# Patient Record
Sex: Female | Born: 1935 | Race: White | Hispanic: No | Marital: Married | Smoking: Former smoker
Health system: Southern US, Community
[De-identification: ages and names within clinical notes are randomized; demographics above are authoritative.]

## PROBLEM LIST (undated history)

## (undated) DIAGNOSIS — I5032 Chronic diastolic (congestive) heart failure: Secondary | ICD-10-CM

## (undated) DIAGNOSIS — Z9889 Other specified postprocedural states: Secondary | ICD-10-CM

## (undated) DIAGNOSIS — Z8679 Personal history of other diseases of the circulatory system: Secondary | ICD-10-CM

## (undated) DIAGNOSIS — M1990 Unspecified osteoarthritis, unspecified site: Secondary | ICD-10-CM

## (undated) DIAGNOSIS — I10 Essential (primary) hypertension: Secondary | ICD-10-CM

## (undated) DIAGNOSIS — R112 Nausea with vomiting, unspecified: Secondary | ICD-10-CM

## (undated) DIAGNOSIS — I350 Nonrheumatic aortic (valve) stenosis: Secondary | ICD-10-CM

## (undated) DIAGNOSIS — I2510 Atherosclerotic heart disease of native coronary artery without angina pectoris: Secondary | ICD-10-CM

## (undated) DIAGNOSIS — I5033 Acute on chronic diastolic (congestive) heart failure: Secondary | ICD-10-CM

## (undated) DIAGNOSIS — R011 Cardiac murmur, unspecified: Secondary | ICD-10-CM

## (undated) DIAGNOSIS — L03116 Cellulitis of left lower limb: Secondary | ICD-10-CM

## (undated) DIAGNOSIS — R42 Dizziness and giddiness: Secondary | ICD-10-CM

## (undated) DIAGNOSIS — N189 Chronic kidney disease, unspecified: Secondary | ICD-10-CM

## (undated) DIAGNOSIS — I480 Paroxysmal atrial fibrillation: Secondary | ICD-10-CM

## (undated) DIAGNOSIS — I482 Chronic atrial fibrillation, unspecified: Secondary | ICD-10-CM

## (undated) DIAGNOSIS — N390 Urinary tract infection, site not specified: Secondary | ICD-10-CM

## (undated) DIAGNOSIS — Z7901 Long term (current) use of anticoagulants: Secondary | ICD-10-CM

## (undated) DIAGNOSIS — J449 Chronic obstructive pulmonary disease, unspecified: Secondary | ICD-10-CM

## (undated) DIAGNOSIS — J45909 Unspecified asthma, uncomplicated: Secondary | ICD-10-CM

## (undated) DIAGNOSIS — Z953 Presence of xenogenic heart valve: Secondary | ICD-10-CM

## (undated) DIAGNOSIS — I38 Endocarditis, valve unspecified: Secondary | ICD-10-CM

## (undated) HISTORY — DX: Chronic atrial fibrillation, unspecified: I48.20

## (undated) HISTORY — DX: Acute on chronic diastolic (congestive) heart failure: I50.33

## (undated) HISTORY — PX: OTHER SURGICAL HISTORY: SHX169

## (undated) HISTORY — DX: Essential (primary) hypertension: I10

## (undated) HISTORY — DX: Personal history of other diseases of the circulatory system: Z86.79

## (undated) HISTORY — DX: Unspecified osteoarthritis, unspecified site: M19.90

## (undated) HISTORY — DX: Endocarditis, valve unspecified: I38

## (undated) HISTORY — DX: Cardiac murmur, unspecified: R01.1

## (undated) HISTORY — DX: Chronic diastolic (congestive) heart failure: I50.32

## (undated) HISTORY — PX: APPENDECTOMY: SHX54

## (undated) HISTORY — PX: CARDIAC CATHETERIZATION: SHX172

## (undated) HISTORY — DX: Chronic kidney disease, unspecified: N18.9

## (undated) HISTORY — DX: Long term (current) use of anticoagulants: Z79.01

## (undated) HISTORY — DX: Atherosclerotic heart disease of native coronary artery without angina pectoris: I25.10

## (undated) HISTORY — PX: BACK SURGERY: SHX140

## (undated) HISTORY — DX: Nonrheumatic aortic (valve) stenosis: I35.0

## (undated) HISTORY — DX: Urinary tract infection, site not specified: N39.0

## (undated) HISTORY — PX: EYE SURGERY: SHX253

## (undated) HISTORY — DX: Cellulitis of left lower limb: L03.116

## (undated) HISTORY — PX: COLONOSCOPY: SHX174

## (undated) SURGERY — ECHOCARDIOGRAM, TRANSESOPHAGEAL
Anesthesia: Moderate Sedation

---

## 1968-09-27 HISTORY — PX: MITRAL VALVE SURGERY: SHX714

## 1981-09-27 HISTORY — PX: ABDOMINAL HYSTERECTOMY: SHX81

## 1998-06-09 ENCOUNTER — Other Ambulatory Visit: Admission: RE | Admit: 1998-06-09 | Discharge: 1998-06-09 | Payer: Self-pay | Admitting: Cardiology

## 1999-01-21 ENCOUNTER — Encounter: Payer: Self-pay | Admitting: Neurosurgery

## 1999-01-21 ENCOUNTER — Ambulatory Visit (HOSPITAL_COMMUNITY): Admission: RE | Admit: 1999-01-21 | Discharge: 1999-01-21 | Payer: Self-pay | Admitting: Neurosurgery

## 1999-01-26 ENCOUNTER — Encounter: Payer: Self-pay | Admitting: Neurosurgery

## 1999-01-27 ENCOUNTER — Inpatient Hospital Stay (HOSPITAL_COMMUNITY): Admission: RE | Admit: 1999-01-27 | Discharge: 1999-01-30 | Payer: Self-pay | Admitting: Neurosurgery

## 1999-01-27 ENCOUNTER — Encounter: Payer: Self-pay | Admitting: Neurosurgery

## 1999-08-14 ENCOUNTER — Encounter: Admission: RE | Admit: 1999-08-14 | Discharge: 1999-08-14 | Payer: Self-pay | Admitting: Cardiology

## 1999-08-14 ENCOUNTER — Encounter: Payer: Self-pay | Admitting: Cardiology

## 2000-08-02 ENCOUNTER — Encounter: Payer: Self-pay | Admitting: Emergency Medicine

## 2000-08-02 ENCOUNTER — Emergency Department (HOSPITAL_COMMUNITY): Admission: EM | Admit: 2000-08-02 | Discharge: 2000-08-02 | Payer: Self-pay | Admitting: Emergency Medicine

## 2000-08-08 ENCOUNTER — Emergency Department (HOSPITAL_COMMUNITY): Admission: EM | Admit: 2000-08-08 | Discharge: 2000-08-08 | Payer: Self-pay | Admitting: Emergency Medicine

## 2000-08-15 ENCOUNTER — Encounter: Payer: Self-pay | Admitting: Neurosurgery

## 2000-08-15 ENCOUNTER — Ambulatory Visit (HOSPITAL_COMMUNITY): Admission: RE | Admit: 2000-08-15 | Discharge: 2000-08-15 | Payer: Self-pay | Admitting: Neurosurgery

## 2000-08-23 ENCOUNTER — Encounter: Payer: Self-pay | Admitting: Cardiology

## 2000-08-23 ENCOUNTER — Encounter: Admission: RE | Admit: 2000-08-23 | Discharge: 2000-08-23 | Payer: Self-pay | Admitting: Cardiology

## 2000-09-07 ENCOUNTER — Other Ambulatory Visit: Admission: RE | Admit: 2000-09-07 | Discharge: 2000-09-07 | Payer: Self-pay | Admitting: Cardiology

## 2000-11-28 ENCOUNTER — Encounter: Payer: Self-pay | Admitting: Neurosurgery

## 2000-11-28 ENCOUNTER — Ambulatory Visit (HOSPITAL_COMMUNITY): Admission: RE | Admit: 2000-11-28 | Discharge: 2000-11-28 | Payer: Self-pay | Admitting: Neurosurgery

## 2001-09-11 ENCOUNTER — Encounter: Payer: Self-pay | Admitting: Cardiology

## 2001-09-11 ENCOUNTER — Encounter: Admission: RE | Admit: 2001-09-11 | Discharge: 2001-09-11 | Payer: Self-pay | Admitting: Cardiology

## 2002-10-08 ENCOUNTER — Encounter: Admission: RE | Admit: 2002-10-08 | Discharge: 2002-10-08 | Payer: Self-pay | Admitting: Cardiology

## 2002-10-08 ENCOUNTER — Encounter: Payer: Self-pay | Admitting: Cardiology

## 2003-02-12 ENCOUNTER — Other Ambulatory Visit: Admission: RE | Admit: 2003-02-12 | Discharge: 2003-02-12 | Payer: Self-pay | Admitting: Gynecology

## 2003-08-13 ENCOUNTER — Emergency Department (HOSPITAL_COMMUNITY): Admission: EM | Admit: 2003-08-13 | Discharge: 2003-08-13 | Payer: Self-pay | Admitting: Emergency Medicine

## 2003-08-16 ENCOUNTER — Encounter: Admission: RE | Admit: 2003-08-16 | Discharge: 2003-08-16 | Payer: Self-pay | Admitting: Cardiology

## 2003-09-03 ENCOUNTER — Other Ambulatory Visit: Admission: RE | Admit: 2003-09-03 | Discharge: 2003-09-03 | Payer: Self-pay | Admitting: Gynecology

## 2003-11-01 ENCOUNTER — Encounter: Admission: RE | Admit: 2003-11-01 | Discharge: 2003-11-01 | Payer: Self-pay | Admitting: Cardiology

## 2004-01-13 ENCOUNTER — Encounter: Admission: RE | Admit: 2004-01-13 | Discharge: 2004-01-13 | Payer: Self-pay | Admitting: Cardiology

## 2004-03-10 ENCOUNTER — Other Ambulatory Visit: Admission: RE | Admit: 2004-03-10 | Discharge: 2004-03-10 | Payer: Self-pay | Admitting: Gynecology

## 2004-06-03 ENCOUNTER — Emergency Department (HOSPITAL_COMMUNITY): Admission: EM | Admit: 2004-06-03 | Discharge: 2004-06-03 | Payer: Self-pay | Admitting: Emergency Medicine

## 2004-06-14 ENCOUNTER — Ambulatory Visit (HOSPITAL_COMMUNITY): Admission: RE | Admit: 2004-06-14 | Discharge: 2004-06-14 | Payer: Self-pay | Admitting: Neurosurgery

## 2004-11-16 ENCOUNTER — Encounter: Admission: RE | Admit: 2004-11-16 | Discharge: 2004-11-16 | Payer: Self-pay | Admitting: Cardiology

## 2005-01-19 ENCOUNTER — Encounter: Admission: RE | Admit: 2005-01-19 | Discharge: 2005-01-19 | Payer: Self-pay | Admitting: Cardiology

## 2005-02-24 ENCOUNTER — Ambulatory Visit (HOSPITAL_COMMUNITY): Admission: RE | Admit: 2005-02-24 | Discharge: 2005-02-24 | Payer: Self-pay | Admitting: Gastroenterology

## 2005-02-24 ENCOUNTER — Encounter (INDEPENDENT_AMBULATORY_CARE_PROVIDER_SITE_OTHER): Payer: Self-pay | Admitting: *Deleted

## 2005-10-18 ENCOUNTER — Ambulatory Visit (HOSPITAL_COMMUNITY): Admission: RE | Admit: 2005-10-18 | Discharge: 2005-10-18 | Payer: Self-pay | Admitting: Gastroenterology

## 2005-10-18 ENCOUNTER — Encounter (INDEPENDENT_AMBULATORY_CARE_PROVIDER_SITE_OTHER): Payer: Self-pay | Admitting: *Deleted

## 2005-11-19 ENCOUNTER — Encounter: Admission: RE | Admit: 2005-11-19 | Discharge: 2005-11-19 | Payer: Self-pay | Admitting: Cardiology

## 2006-12-16 ENCOUNTER — Encounter: Admission: RE | Admit: 2006-12-16 | Discharge: 2006-12-16 | Payer: Self-pay | Admitting: Cardiology

## 2007-07-30 ENCOUNTER — Inpatient Hospital Stay (HOSPITAL_COMMUNITY): Admission: EM | Admit: 2007-07-30 | Discharge: 2007-07-30 | Payer: Self-pay | Admitting: Emergency Medicine

## 2007-07-30 ENCOUNTER — Ambulatory Visit: Payer: Self-pay | Admitting: *Deleted

## 2007-10-06 ENCOUNTER — Encounter: Payer: Self-pay | Admitting: Internal Medicine

## 2007-10-30 ENCOUNTER — Encounter: Payer: Self-pay | Admitting: Internal Medicine

## 2008-01-04 ENCOUNTER — Ambulatory Visit: Payer: Self-pay | Admitting: Internal Medicine

## 2008-01-04 DIAGNOSIS — R05 Cough: Secondary | ICD-10-CM

## 2008-01-04 DIAGNOSIS — R0602 Shortness of breath: Secondary | ICD-10-CM

## 2008-01-05 DIAGNOSIS — I050 Rheumatic mitral stenosis: Secondary | ICD-10-CM | POA: Insufficient documentation

## 2008-01-05 DIAGNOSIS — I4891 Unspecified atrial fibrillation: Secondary | ICD-10-CM | POA: Insufficient documentation

## 2008-01-05 DIAGNOSIS — I340 Nonrheumatic mitral (valve) insufficiency: Secondary | ICD-10-CM

## 2008-01-05 DIAGNOSIS — I099 Rheumatic heart disease, unspecified: Secondary | ICD-10-CM | POA: Insufficient documentation

## 2008-01-05 HISTORY — DX: Rheumatic mitral stenosis: I05.0

## 2008-01-11 ENCOUNTER — Telehealth: Payer: Self-pay | Admitting: Internal Medicine

## 2008-01-26 ENCOUNTER — Encounter: Admission: RE | Admit: 2008-01-26 | Discharge: 2008-01-26 | Payer: Self-pay | Admitting: Cardiology

## 2008-01-30 ENCOUNTER — Ambulatory Visit: Payer: Self-pay | Admitting: Internal Medicine

## 2008-01-30 DIAGNOSIS — IMO0001 Reserved for inherently not codable concepts without codable children: Secondary | ICD-10-CM

## 2008-05-31 ENCOUNTER — Inpatient Hospital Stay (HOSPITAL_COMMUNITY): Admission: EM | Admit: 2008-05-31 | Discharge: 2008-06-03 | Payer: Self-pay | Admitting: Cardiology

## 2009-02-09 ENCOUNTER — Emergency Department (HOSPITAL_COMMUNITY): Admission: EM | Admit: 2009-02-09 | Discharge: 2009-02-09 | Payer: Self-pay | Admitting: Emergency Medicine

## 2009-06-13 ENCOUNTER — Encounter: Admission: RE | Admit: 2009-06-13 | Discharge: 2009-06-13 | Payer: Self-pay | Admitting: Neurosurgery

## 2009-06-27 ENCOUNTER — Ambulatory Visit: Payer: Self-pay | Admitting: Vascular Surgery

## 2009-12-26 ENCOUNTER — Encounter: Admission: RE | Admit: 2009-12-26 | Discharge: 2009-12-26 | Payer: Self-pay | Admitting: Cardiology

## 2010-01-11 ENCOUNTER — Emergency Department (HOSPITAL_COMMUNITY): Admission: EM | Admit: 2010-01-11 | Discharge: 2010-01-12 | Payer: Self-pay | Admitting: Emergency Medicine

## 2010-01-26 ENCOUNTER — Encounter: Payer: Self-pay | Admitting: Cardiology

## 2010-05-11 ENCOUNTER — Ambulatory Visit: Payer: Self-pay | Admitting: Cardiology

## 2010-06-10 ENCOUNTER — Ambulatory Visit: Payer: Self-pay | Admitting: Cardiology

## 2010-07-10 ENCOUNTER — Ambulatory Visit: Payer: Self-pay | Admitting: Cardiology

## 2010-08-17 ENCOUNTER — Ambulatory Visit: Payer: Self-pay | Admitting: Cardiovascular Disease

## 2010-09-01 ENCOUNTER — Encounter: Admission: RE | Admit: 2010-09-01 | Discharge: 2010-09-01 | Payer: Self-pay | Admitting: Neurosurgery

## 2010-09-01 ENCOUNTER — Ambulatory Visit: Payer: Self-pay | Admitting: Cardiology

## 2010-09-08 ENCOUNTER — Ambulatory Visit: Payer: Self-pay | Admitting: Cardiology

## 2010-10-14 ENCOUNTER — Ambulatory Visit: Payer: Self-pay | Admitting: Cardiology

## 2010-10-19 ENCOUNTER — Encounter: Payer: Self-pay | Admitting: Cardiology

## 2010-12-14 ENCOUNTER — Encounter (HOSPITAL_COMMUNITY)
Admission: RE | Admit: 2010-12-14 | Discharge: 2010-12-14 | Disposition: A | Discharge status: Home / Self Care | Payer: Medicare Other | Source: Ambulatory Visit | Attending: Neurosurgery | Admitting: Neurosurgery

## 2010-12-14 ENCOUNTER — Other Ambulatory Visit: Payer: Self-pay | Admitting: Neurosurgery

## 2010-12-14 ENCOUNTER — Ambulatory Visit (HOSPITAL_COMMUNITY)
Admission: RE | Admit: 2010-12-14 | Discharge: 2010-12-14 | Disposition: A | Discharge status: Home / Self Care | Payer: Medicare Other | Source: Ambulatory Visit | Attending: Neurosurgery | Admitting: Neurosurgery

## 2010-12-14 DIAGNOSIS — Z01812 Encounter for preprocedural laboratory examination: Secondary | ICD-10-CM | POA: Insufficient documentation

## 2010-12-14 DIAGNOSIS — M545 Low back pain, unspecified: Secondary | ICD-10-CM

## 2010-12-14 DIAGNOSIS — Z01818 Encounter for other preprocedural examination: Secondary | ICD-10-CM | POA: Insufficient documentation

## 2010-12-14 LAB — TYPE AND SCREEN

## 2010-12-14 LAB — CBC
HCT: 38 % (ref 36.0–46.0)
MCH: 32.2 pg (ref 26.0–34.0)
MCHC: 35.8 g/dL (ref 30.0–36.0)
MCV: 89.8 fL (ref 78.0–100.0)
RDW: 13 % (ref 11.5–15.5)

## 2010-12-14 LAB — SURGICAL PCR SCREEN: Staphylococcus aureus: NEGATIVE

## 2010-12-14 LAB — BASIC METABOLIC PANEL
BUN: 12 mg/dL (ref 6–23)
Chloride: 99 mEq/L (ref 96–112)
Glucose, Bld: 105 mg/dL — ABNORMAL HIGH (ref 70–99)
Potassium: 3.4 mEq/L — ABNORMAL LOW (ref 3.5–5.1)
Sodium: 139 mEq/L (ref 135–145)

## 2010-12-14 LAB — ABO/RH: ABO/RH(D): O POS

## 2010-12-15 ENCOUNTER — Inpatient Hospital Stay (HOSPITAL_COMMUNITY): Payer: Medicare Other

## 2010-12-15 ENCOUNTER — Inpatient Hospital Stay (HOSPITAL_COMMUNITY)
Admission: RE | Admit: 2010-12-15 | Discharge: 2010-12-21 | DRG: 460 | Disposition: A | Discharge status: Home / Self Care | Payer: Medicare Other | Source: Ambulatory Visit | Attending: Neurosurgery | Admitting: Neurosurgery

## 2010-12-15 DIAGNOSIS — M51379 Other intervertebral disc degeneration, lumbosacral region without mention of lumbar back pain or lower extremity pain: Secondary | ICD-10-CM | POA: Diagnosis present

## 2010-12-15 DIAGNOSIS — Q762 Congenital spondylolisthesis: Secondary | ICD-10-CM

## 2010-12-15 DIAGNOSIS — Z87891 Personal history of nicotine dependence: Secondary | ICD-10-CM

## 2010-12-15 DIAGNOSIS — Z951 Presence of aortocoronary bypass graft: Secondary | ICD-10-CM

## 2010-12-15 DIAGNOSIS — I10 Essential (primary) hypertension: Secondary | ICD-10-CM | POA: Diagnosis present

## 2010-12-15 DIAGNOSIS — Z7901 Long term (current) use of anticoagulants: Secondary | ICD-10-CM

## 2010-12-15 DIAGNOSIS — I4891 Unspecified atrial fibrillation: Secondary | ICD-10-CM | POA: Diagnosis present

## 2010-12-15 DIAGNOSIS — I2510 Atherosclerotic heart disease of native coronary artery without angina pectoris: Secondary | ICD-10-CM | POA: Diagnosis present

## 2010-12-15 DIAGNOSIS — Z01812 Encounter for preprocedural laboratory examination: Secondary | ICD-10-CM

## 2010-12-15 DIAGNOSIS — M5137 Other intervertebral disc degeneration, lumbosacral region: Secondary | ICD-10-CM | POA: Diagnosis present

## 2010-12-15 DIAGNOSIS — K219 Gastro-esophageal reflux disease without esophagitis: Secondary | ICD-10-CM | POA: Diagnosis present

## 2010-12-15 DIAGNOSIS — M5126 Other intervertebral disc displacement, lumbar region: Secondary | ICD-10-CM | POA: Diagnosis present

## 2010-12-15 LAB — COMPREHENSIVE METABOLIC PANEL
ALT: 22 U/L (ref 0–35)
CO2: 28 mEq/L (ref 19–32)
Calcium: 9 mg/dL (ref 8.4–10.5)
Creatinine, Ser: 0.66 mg/dL (ref 0.4–1.2)
GFR calc Af Amer: >60 mL/min (ref >60)
GFR calc non Af Amer: >60 mL/min (ref >60)
Glucose, Bld: 117 mg/dL — ABNORMAL HIGH (ref 70–99)
Sodium: 137 mEq/L (ref 135–145)
Total Protein: 7.3 g/dL (ref 6.0–8.3)

## 2010-12-15 LAB — POCT CARDIAC MARKERS
CKMB, poc: <1 ng/mL — ABNORMAL LOW (ref 1.0–8.0)
Myoglobin, poc: 63.3 ng/mL (ref 12–200)
Troponin i, poc: <0.05 ng/mL (ref 0.00–0.09)

## 2010-12-15 LAB — CULTURE, BLOOD (ROUTINE X 2)

## 2010-12-15 LAB — DIFFERENTIAL
Eosinophils Absolute: 0.1 10*3/uL (ref 0.0–0.7)
Lymphocytes Relative: 11 % — ABNORMAL LOW (ref 12–46)
Lymphs Abs: 1 10*3/uL (ref 0.7–4.0)
Monocytes Relative: 8 % (ref 3–12)
Neutrophils Relative %: 80 % — ABNORMAL HIGH (ref 43–77)

## 2010-12-15 LAB — PROTIME-INR
INR: 1.18 (ref 0.00–1.49)
INR: 2.16 — ABNORMAL HIGH (ref 0.00–1.49)
Prothrombin Time: 15.2 seconds (ref 11.6–15.2)
Prothrombin Time: 23.9 seconds — ABNORMAL HIGH (ref 11.6–15.2)

## 2010-12-15 LAB — CBC
Hemoglobin: 13.8 g/dL (ref 12.0–15.0)
MCHC: 35.5 g/dL (ref 30.0–36.0)
MCV: 93 fL (ref 78.0–100.0)
RDW: 12.7 % (ref 11.5–15.5)

## 2010-12-15 LAB — APTT: aPTT: 48 seconds — ABNORMAL HIGH (ref 24–37)

## 2010-12-15 LAB — BRAIN NATRIURETIC PEPTIDE: Pro B Natriuretic peptide (BNP): 129 pg/mL — ABNORMAL HIGH (ref 0.0–100.0)

## 2010-12-18 ENCOUNTER — Other Ambulatory Visit: Payer: Self-pay | Admitting: *Deleted

## 2010-12-19 LAB — BASIC METABOLIC PANEL
CO2: 29 mEq/L (ref 19–32)
Glucose, Bld: 111 mg/dL — ABNORMAL HIGH (ref 70–99)
Potassium: 3.7 mEq/L (ref 3.5–5.1)
Sodium: 139 mEq/L (ref 135–145)

## 2010-12-19 LAB — CBC
HCT: 30.1 % — ABNORMAL LOW (ref 36.0–46.0)
Hemoglobin: 10.5 g/dL — ABNORMAL LOW (ref 12.0–15.0)
MCH: 32.2 pg (ref 26.0–34.0)
MCHC: 34.9 g/dL (ref 30.0–36.0)

## 2010-12-24 NOTE — Op Note (Signed)
NAME:  [name], [name]                ACCOUNT NO.:  [id number]  MEDICAL RECORD NO.:  [id number]           PATIENT TYPE:  I  LOCATION:  3102                         FACILITY:  [facility]  PHYSICIAN:  Danae Orleans. Venetia Maxon, M.D.  DATE OF BIRTH:  02-08-1935  DATE OF PROCEDURE:  12/15/2010 DATE OF DISCHARGE:                              OPERATIVE REPORT   PREOPERATIVE DIAGNOSES:  L5 spondylolysis, L5-S1 spondylolisthesis, herniated lumbar disk stenosis, degenerative disk disease, lumbar radiculopathy.  POSTOPERATIVE DIAGNOSES:  L5 spondylolysis, L5-S1 spondylolisthesis, herniated lumbar disk stenosis, degenerative disk disease, lumbar radiculopathy.  PROCEDURE: 1. L5 Gill procedure. 2. L5 through S1 decompression, thorough diskectomy, and posterior     lumbar interbody fusion with PEEK interbody cages, morselized bone     autograft, allograft, and PureGen. 3. Pedicle screw fixation at L5 through S1 bilaterally. 4. Posterolateral arthrodesis.  SURGEON:  Danae Orleans. Venetia Maxon, MD.  ASSISTANT:  Georgiann Cocker, RN and Stefani Dama, MD.  ANESTHESIA:  General endotracheal anesthesia.  ESTIMATED BLOOD LOSS:  250 mL.  COMPLICATIONS:  None.  DISPOSITION:  Recovery.  INDICATIONS:  Catherine [name] is a 76 year old woman with symptomatic spondylolisthesis of L5 and S1 with bilateral L5 spondylolysis.  It was elected to take her to surgery for decompression and fusion at this affected level.  DESCRIPTION OF PROCEDURE:  Catherine [name] was brought to the operating room. Following satisfactory and uncomplicated induction of general endotracheal anesthesia and placement of intravenous lines and Foley catheter, the patient was placed in prone position on the [place] table. Soft tissue and bony prominences were padded appropriately.  Her lumbosacral junction was incised in the midline after infiltrating the skin and subcutaneous tissues with local lidocaine.  Incision was carried through the lumbodorsal fascia  which was incised bilaterally. Subperiosteal dissection was performed exposing the L5 transverse processes and sacral alae.  The L5 posterior elements were clearly disconnected and floating.  After confirmatory radiograph demonstrated correct level, the posterior elements of L5 were disarticulated and removed as was the spondylitic material at the L5 pars, both L5 nerve roots, and the sacral nerve roots were carefully decompressed with a variety of Kerrison rongeurs.  Subsequently, a thorough diskectomy was performed with decompression of both L5 and S1 nerve roots and the interspace was thoroughly decompressed and cleared of investing of cartilaginous and disk material.  The bone mill was used to grind up these posterior elements of L5, which were then used for bone autograft. Approximately 6 mL of bone autograft was placed deep within the interspace and tamped into position and subsequently 9-mm PEEK interbody cages which were packed with profuse blocks, soaked PureGen, stem cells were then tamped into position and countersunk appropriately. Subsequently, pedicle screw fixation was placed using a 6.5 x 40-mm screws at the S1 pedicles and 6.5 x 45-mm screws at L5.  All screws had excellent purchase and the positioning was confirmed on AP and lateral fluoroscopy.  A 35-mm preloaded rods were affixed to the screw heads after 5 mL of Vitoss foam was used bilaterally along with the remaining bone autograft on the right in the posterolateral region, and implant graft material  was tamped into position.  The rods were locked down in situ.  The self-retaining tractor was removed.  Lumbodorsal fascia was closed with one Vicryl sutures, subcutaneous tissues were reapproximated with 2-0 Vicryl interrupted inverted sutures, and skin edges were reapproximated with 3-0 Vicryl subcuticular stitch.  The wound was dressed with Benzoin, Steri-Strips, Telfa gauze, and tape.  The patient was extubated in  the operating room and taken to the recovery room in stable satisfactory condition, having tolerated the operation well. Counts were correct at the end of the case.     Danae Orleans. Venetia Maxon, M.D.     JDS/MEDQ  D:  12/15/2010  T:  12/16/2010  Job:  [id number]  Electronically Signed by Maeola Harman M.D. on 12/24/2010 07:30:51 AM

## 2010-12-30 ENCOUNTER — Ambulatory Visit (INDEPENDENT_AMBULATORY_CARE_PROVIDER_SITE_OTHER): Payer: Medicare Other | Admitting: *Deleted

## 2010-12-30 DIAGNOSIS — I4891 Unspecified atrial fibrillation: Secondary | ICD-10-CM

## 2010-12-30 DIAGNOSIS — Z7901 Long term (current) use of anticoagulants: Secondary | ICD-10-CM

## 2010-12-30 LAB — POCT INR: INR: 2.6

## 2011-01-05 LAB — PROTIME-INR: Prothrombin Time: 31.3 seconds — ABNORMAL HIGH (ref 11.6–15.2)

## 2011-01-05 LAB — POCT I-STAT, CHEM 8
BUN: 17 mg/dL (ref 6–23)
Creatinine, Ser: 1.1 mg/dL (ref 0.4–1.2)
Hemoglobin: 14.6 g/dL (ref 12.0–15.0)
Potassium: 3.3 mEq/L — ABNORMAL LOW (ref 3.5–5.1)
Sodium: 139 mEq/L (ref 135–145)

## 2011-01-05 LAB — DIFFERENTIAL
Eosinophils Relative: 1 % (ref 0–5)
Lymphocytes Relative: 18 % (ref 12–46)
Lymphs Abs: 1.9 10*3/uL (ref 0.7–4.0)
Monocytes Absolute: 0.5 10*3/uL (ref 0.1–1.0)

## 2011-01-05 LAB — CBC
HCT: 40.8 % (ref 36.0–46.0)
Hemoglobin: 14.2 g/dL (ref 12.0–15.0)
WBC: 10.8 10*3/uL — ABNORMAL HIGH (ref 4.0–10.5)

## 2011-01-19 NOTE — Discharge Summary (Signed)
  NAME:  Catherine [name], Catherine [name]                ACCOUNT NO.:  [id number]  MEDICAL RECORD NO.:  [id number]           PATIENT TYPE:  I  LOCATION:  3006                         FACILITY:  [facility]  PHYSICIAN:  Danae Orleans. Venetia Maxon, M.D.  DATE OF BIRTH:  04/12/1935  DATE OF ADMISSION:  12/15/2010 DATE OF DISCHARGE:  12/21/2010                              DISCHARGE SUMMARY   REASON FOR ADMISSION:  [name] is a 76 year old woman with spondylolisthesis of L5 on S1 with bilateral L5 spondylolysis with additional comorbidities of atrial fibrillation requiring chronic anticoagulation.  She was admitted to the hospital on same day's procedure basis on December 15, 2010, and underwent L5 Gill procedure, L5- S1 decompression with interbody cage placement, pedicle screw fixation, and posterolateral arthrodesis.  The patient tolerated the procedure well.  Her preoperative anticoagulation had been stopped prior to surgery, and she was doing well from a neurologic standpoint.  She after discussion with her cardiologist, was elected to start her on Lovenox 40 mg subcutaneously on December 16, 2010, and the patient was then started on oral Coumadin therapy on December 18, 2010.  She did well with physical therapy and did not require any additional therapy.  The length of her hospitalization was secondary to continued anticoagulation and under the management of pharmacy services with gradual elevation of her INR to the point with therapeutic INR.  It was, therefore, elected to be discharged on December 21, 2010.  She had an INR of 1.75, and it was elected to let her go home with this with 2.5 mg Coumadin orally and then to resume her home schedule, and to follow up with Dr. Patty Sermons in his office for continued management of anticoagulation.  DISCHARGE MEDICATIONS:  Preoperative medications of: 1. Multivitamin. 2. Fish oil. 3. Vitamin C. 4. Coumadin 2.5 mg daily. 5. Maxzide 37.5/25 daily. 6. Atenolol 25 mg half tablet  daily. 7. Lanoxin 0.125 mg daily. 8. Lasix 40 mg twice daily. 9. Potassium chloride 10 mEq 1-2 tablets 3 times daily. 10.Hydrocodone 5/325 1-2 tablets every 6 hours as needed. 11.Lovenox 60 mg once per day after discharge.  Instructions were to follow up with me in the office in 3 weeks postoperatively with lumbar radiographs.     Danae Orleans. Venetia Maxon, M.D.    JDS/MEDQ  D:  01/13/2011  T:  01/13/2011  Job:  [id number]  Electronically Signed by Maeola Harman M.D. on 01/19/2011 07:37:15 AM

## 2011-01-22 ENCOUNTER — Encounter: Payer: Self-pay | Admitting: *Deleted

## 2011-01-22 DIAGNOSIS — I4891 Unspecified atrial fibrillation: Secondary | ICD-10-CM | POA: Insufficient documentation

## 2011-01-26 ENCOUNTER — Encounter: Payer: Medicare Other | Admitting: *Deleted

## 2011-01-26 ENCOUNTER — Ambulatory Visit (HOSPITAL_COMMUNITY)
Admission: RE | Admit: 2011-01-26 | Discharge: 2011-01-26 | Disposition: A | Discharge status: Home / Self Care | Payer: Medicare Other | Source: Ambulatory Visit | Attending: Neurosurgery | Admitting: Neurosurgery

## 2011-01-26 DIAGNOSIS — M79609 Pain in unspecified limb: Secondary | ICD-10-CM | POA: Insufficient documentation

## 2011-01-27 ENCOUNTER — Ambulatory Visit (INDEPENDENT_AMBULATORY_CARE_PROVIDER_SITE_OTHER): Payer: Medicare Other | Admitting: Cardiology

## 2011-01-27 ENCOUNTER — Ambulatory Visit (INDEPENDENT_AMBULATORY_CARE_PROVIDER_SITE_OTHER): Payer: Medicare Other | Admitting: *Deleted

## 2011-01-27 ENCOUNTER — Encounter: Payer: Self-pay | Admitting: Cardiology

## 2011-01-27 DIAGNOSIS — I4891 Unspecified atrial fibrillation: Secondary | ICD-10-CM

## 2011-01-27 DIAGNOSIS — M545 Low back pain, unspecified: Secondary | ICD-10-CM

## 2011-01-27 LAB — POCT INR: INR: 2

## 2011-01-27 NOTE — Progress Notes (Signed)
Catherine [name] Date of Birth:  1934-11-07 [facility] [address]   [address] [place], [place]  [address] [phone]           Fax   [phone]  HPI: This pleasant 76 year old woman is seen for a scheduled followup office visit.  She has known rheumatic heart disease.  She has mitral stenosis.  She had a previous mitral commissurotomy in 1970.  She is in chronic atrial fibrillation and has been on long-term Coumadin.  She also has a history of essential hypertension.  He has not been having any chest pain or increased dyspnea.  She's not having any orthopnea or paroxysmal nocturnal dyspnea.  She's had no thromboembolic symptoms.  She's not having any side effects from the Coumadin.  Current Outpatient Prescriptions  Medication Sig Dispense Refill  . Ascorbic Acid (VITAMIN C) 500 MG tablet Take 500 mg by mouth daily.        Marland Kitchen atenolol (TENORMIN) 25 MG tablet Take 25 mg by mouth daily. Taking 1/2 tab daily       . diazepam (VALIUM) 5 MG tablet Take 5 mg by mouth daily as needed.        . digoxin (LANOXIN) 0.125 MG tablet Take 125 mcg by mouth daily.        . diphenoxylate-atropine (LOMOTIL) 2.5-0.025 MG per tablet Take 1 tablet by mouth 4 (four) times daily as needed.        . furosemide (LASIX) 80 MG tablet Take 40 mg by mouth 2 (two) times daily.       Marland Kitchen HYDROcodone-acetaminophen (NORCO) 5-325 MG per tablet Take 1 tablet by mouth every 6 (six) hours as needed.        . Multiple Vitamin (MULTIVITAMIN) tablet Take 1 tablet by mouth daily.        . Omega-3 Fatty Acids (FISH OIL) 1000 MG CAPS Take by mouth daily.        . potassium chloride (K-DUR,KLOR-CON) 10 MEQ tablet Take 10 mEq by mouth 5 (five) times daily.        . pregabalin (LYRICA) 75 MG capsule Take 75 mg by mouth 2 (two) times daily.        . Promethazine HCl (PHENERGAN PO) Take by mouth. Take as needed       . triamterene-hydrochlorothiazide (MAXZIDE-25) 37.5-25 MG per tablet Take 1 tablet by mouth  daily.        Marland Kitchen warfarin (COUMADIN) 5 MG tablet Take 5 mg by mouth daily. Taking 2.5 mg as directed       . estrogens, conjugated, (PREMARIN) 0.3 MG tablet Take 0.3 mg by mouth daily. Take daily for 21 days then do not take for 7 days.         Allergies  Allergen Reactions  . Ace Inhibitors   . Amoxicillin     tachy  . Ciprocin-Fluocin-Procin (Fluocinolone Acetonide)   . Codeine   . Diltiazem     ha  . Flagyl (Metronidazole Hcl)   . Flovent (Fluticasone Propionate)     Leg cramps  . Lotensin     cough  . Macrobid     nausea  . Quinidine     Fever diarrhea  . Verapamil     myalgias    Patient Active Problem List  Diagnoses  . RHEUMATIC MITRAL STENOSIS  . RHEUMATIC HEART DISEASE  . VALVULAR HEART DISEASE  . Atrial Fibrillation  . COPD  . DYSPNEA  . COUGH  .  Atrial fibrillation  . Low back pain    History  Smoking status  . Former Smoker  Smokeless tobacco  . Not on file    History  Alcohol Use     Family History  Problem Relation Age of Onset  . Leukemia Father     Review of Systems: The patient denies any heat or cold intolerance.  No weight gain or weight loss.  The patient denies headaches or blurry vision.  There is no cough or sputum production.  The patient denies dizziness.  There is no hematuria or hematochezia.  The patient denies any muscle aches or arthritis.  The patient denies any rash.  The patient denies frequent falling or instability.  There is no history of depression or anxiety.  All other systems were reviewed and are negative.   Physical Exam: Filed Vitals:   01/27/11 1025  BP: 130/80  Pulse: 60  The general appearance reveals a well-developed well-nourished woman in no distress.Pupils equal and reactive.   Extraocular Movements are full.  There is no scleral icterus.  The mouth and pharynx are normal.  The neck is supple.  The carotids reveal no bruits.  The jugular venous pressure is normal.  The thyroid is not enlarged.  There is  no lymphadenopathy.The chest is clear to percussion and auscultation. There are no rales or rhonchi. Expansion of the chest is symmetrical.  The heart reveals a murmur of mitral stenosis grade 2/6 apical rumble.  The breasts reveal no masses.The abdomen is soft and nontender. Bowel sounds are normal. The liver and spleen are not enlarged. There Are no abdominal masses. There are no bruits.The pedal pulses are good.  There is no phlebitis or edema.  There is no cyanosis or clubbing.Strength is normal and symmetrical in all extremities.  There is no lateralizing weakness.  There are no sensory deficits.The skin is warm and dry.  There is no rash.No muscle weakness or atrophy.  No muscle tenderness.There are no overt psychiatric signs or symptoms.    Assessment / Plan: Continue on same medication he is weaning off her pain medication As she improves from her back surgery. She wants to be able to eat more green vegetables and so we're increasing her Coumadin to 2.5 mg 7 days a week and she will increase her intake of green.  Her INR today is 2.0.  Recheck in one month for protime and in 2 months for protime in 3 months for office visit and protime

## 2011-01-27 NOTE — Assessment & Plan Note (Signed)
The patient has had successful back surgery on 03/20/12Done by Dr. Venetia Maxon.She still has some residual weakness in her legs and hasn't been able to walk as much as she had hoped.

## 2011-01-27 NOTE — Assessment & Plan Note (Signed)
The patient has a history of rheumatic heart disease with mitral stenosis.  He has had chronic atrial fibrillation And has been on long-term Coumadin anticoagulation.  Since last visit she has been doing well.  She had successful surgery on her back on 12/15/10.  She was transitioned with Lovenox successfully and is now back on Coumadin.  She has had some minor discomfort in her right calf and had a venous Doppler yesterday which showed no deep vein thrombosis.  She's not had any thromboembolic episodes from her atrial fibrillation.  She's not having any increased dyspnea or chest pain.

## 2011-02-09 ENCOUNTER — Other Ambulatory Visit: Payer: Self-pay | Admitting: Cardiology

## 2011-02-09 DIAGNOSIS — F419 Anxiety disorder, unspecified: Secondary | ICD-10-CM

## 2011-02-09 DIAGNOSIS — J069 Acute upper respiratory infection, unspecified: Secondary | ICD-10-CM

## 2011-02-09 MED ORDER — LEVOFLOXACIN 500 MG PO TABS: 500.0000 mg | ORAL_TABLET | Freq: Every day | ORAL | Status: DC

## 2011-02-09 MED ORDER — DIAZEPAM 5 MG PO TABS: 5.0000 mg | ORAL_TABLET | Freq: Every day | ORAL | Status: DC | PRN

## 2011-02-09 NOTE — Telephone Encounter (Signed)
She would benefit from an antibiotic for probable upper respiratory infection and probable urinary tract infection.  In view of her multiple allergies we will choose Levaquin 500 mg daily for 5 days

## 2011-02-09 NOTE — Telephone Encounter (Signed)
Pt wanted to talk to you about having urination problems? Please call

## 2011-02-09 NOTE — Telephone Encounter (Signed)
Agree with plan 

## 2011-02-09 NOTE — H&P (Signed)
NAME:  Catherine [name], Catherine [name]                ACCOUNT NO.:  [id number]   MEDICAL RECORD NO.:  [id number]          PATIENT TYPE:  EMS   LOCATION:  MAJO                         FACILITY:  [facility]   PHYSICIAN:  Unice Cobble, MD     DATE OF BIRTH:  1935-09-22   DATE OF ADMISSION:  07/30/2007  DATE OF DISCHARGE:                              HISTORY & PHYSICAL   CARDIOLOGIST:  Cassell Clement, M.D.   CHIEF COMPLAINT:  Chest pain.   HISTORY OF PRESENT ILLNESS:  This is a 76 year old white female with  history of atrial fibrillation, mitral stenosis status post  commissurotomy in 1970, hypertension who presents with chest pain.  The  patient was in her normal state of health with a half mile treadmill  walk per day with no limitations of physical activity who woke up from  sleep at 12:45 a.m. with chest pain.  The patient states her chest pain  was under her left breast and radiated slightly to her back to the left  of the midclavicular line, more towards the axilla.  There was no  diaphoresis, palpitations or shortness of breath.  She walked around her  house and noticed a pleuritic component.  Stable lower extremity edema.  No orthopnea or PND.  She relates that she lifted a heavy chair twice  over her mother's bed today at her nursing home which is an unusual  activity for her.   PAST MEDICAL HISTORY:  1. Chronic atrial fibrillation.  2. Mitral stenosis status post mitral valve commissurotomy in 1970.  3. Hypertension.  4. History of kidney stones.  5. History of gallstones.  6. Status post hysterectomy.  7. Status post cataract surgery.  8. Cervical disk surgery.   ALLERGIES:  THE PATIENT HAS REACTIONS TO PRONESTYL, QUINIDINE,  VERAPAMIL, DILTIAZEM, AMOXICILLIN.   MEDICATIONS:  1. Potassium chloride 10 mEq t.i.d.  2. Premarin 0.3 mg daily.  3. Lasix 40 mg daily.  4. Lanoxin 125 mcg b.i.d.  5. Atenolol 12.5 mg daily.  6. Maxzide 25 mg daily.  7. Coumadin 2.5 mg daily with the  exception of Monday in which he      takes no medication.  8. Vitamin C 500 mg daily.  9. Multivitamin without iron daily.  10.Micardis 40 mg daily.   SOCIAL HISTORY:  She lives in [place] with her spouse.  She used to work  full time at a [employer] and now only works 1.5 days per week.  She has  a 10 pack per year history of smoking.  She quit smoking a long time  ago.  No alcohol or drugs.   FAMILY HISTORY:  Her mother has Alzheimer's and is in a nursing home.  Father died at the age of 38 of leukemia.   REVIEW OF SYSTEMS:  Complete review of systems was done and otherwise  found to be negative except as stated in the HPI.   PHYSICAL EXAMINATION:  VITAL SIGNS:  Temperature 97.2, blood pressure  150/77 which decreased to 116/57 without any therapy, respirations 16,  pulse 57.  She is saturating 99% on 2  liters.  GENERAL:  This is a thin, white female in no acute distress.  HEENT:  PERRLA.  EOMI.  MMM.  Oropharynx is without erythema or  exudates.  NECK:  Supple without lymphadenopathy, thyromegaly, bruits or JVD.  HEART:  Irregularly irregular.  She has a normal S1, S2.  Pulses are 2+  and equal without bruits.  She has a 2/6 systolic murmur best heard in  the left sternal border which radiates to the left axilla.  She also has  a 2/6 diastolic component of this murmur as well.  LUNGS:  Clear to auscultation bilaterally.  SKIN:  No rash.  ABDOMEN:  Nontender without rebound or guarding.  Normal bowel sounds.  EXTREMITIES:  No cyanosis, clubbing or edema.  She does have bilateral  1+ edema.  MUSCULOSKELETAL:  No rib tenderness.  NEUROLOGICAL:  Alert and oriented x3 with 5/5 strength in all  extremities and normal sensation throughout.   RADIOLOGY:  Chest x-ray shows no acute cardiopulmonary disease.  EKG  shows a rate of 55 in atrial fibrillation.  She has a normal axis  without hypertrophy.  She has anterior septal Q waves.  She has 0.5 mm  of anteroseptal ST elevation with J  point elevation.  Inferolateral she  sloping ST depression most consistent with digoxin therapy.  She also  has T wave inversions in the anterior septal regions.   LABORATORY DATA:  She has a white count of 8.1 with a hemoglobin of 13  and a platelet count of 245.  Her first cardiac markers are negative.  She has an INR of 2.0 and a D. dimer of 0.3.  Lipase is 31.   ASSESSMENT/PLAN:  This is a 76 year old white female with history of  mitral stenosis, hypertension, chronic atrial fibrillation who presents  with chest pain.  1. Chest pain: Her chest pain is atypical for acute coronary syndrome.      EKG shows changes consistent with digoxin, but cannot rule out      acute coronary syndrome at this time.  Cardiac markers are      initially negative.  I will continue to rule out acute coronary      syndrome with serial markers and treat her pain primarily as      musculoskeletal/pleuritic for now.  D-dimer is negative.  She has      received aspirin in the emergency department.  Stress versus      catheterization will be performed based on cardiac markers and      attending discretion.  2. Atrial fibrillation: Continue home medication.  Continue      anticoagulation with Coumadin.  Check digoxin level.  3. DVT prophylaxis/GI prophylaxis.     Unice Cobble, MD  Electronically Signed    ACJ/MEDQ  D:  07/30/2007  T:  07/30/2007  Job:  [id number]

## 2011-02-09 NOTE — Discharge Summary (Signed)
NAME:  Catherine [name], Catherine [name]                ACCOUNT NO.:  [id number]   MEDICAL RECORD NO.:  [id number]          PATIENT TYPE:  INP   LOCATION:  2040                         FACILITY:  [facility]   PHYSICIAN:  Vesta Mixer, M.D. DATE OF BIRTH:  Sep 10, 1935   DATE OF ADMISSION:  07/30/2007  DATE OF DISCHARGE:  07/30/2007                               DISCHARGE SUMMARY   DISCHARGE DIAGNOSES:  1. Pleuritic chest pain, most likely musculoskeletal.  2. History of intermittent atrial fibrillation.  3. Hypertension.   DISCHARGE MEDICATIONS:  1. Potassium chloride 10 mEq three times a day.  2. Premarin 0.3 mg a day.  3. Lasix 40 mg a day.  4. Lanoxin 0.125 mg a day.  5. Atenolol 25 mg tablets, 1/2 tablet a day.  6. Micardis 40 mg a day.  7. Maxzide 25 mg a day.  8. Coumadin 2.5 mg 6 days a week.  9. Vitamin C once a day.  10.Multivitamin once a day.   DISPOSITION:  The patient will see Dr. Patty Sermons in 1-2 weeks for office  visit and pro time.   HISTORY OF PRESENT ILLNESS:  Ms. Hejl is a 76 year old female with a  history of intermittent atrial fibrillation.  She has a history of  hypertension.  She is admitted with pleuritic chest pain.   Please see dictated H&P for further details.   HOSPITAL COURSE:  The patient did some extra work yesterday.  She lifted  a chair over her mother's bed after she threw tending to her.  She  thinks that she may have pulled something.  She ruled out for myocardial  infarction with serial CPKs.  A spiral CT was negative for a pulmonary  embolus.  We will discharge her on the same medications.  She has been  instructed to take some Tylenol or Motrin as needed.   CONDITION ON DISCHARGE:  She is quite stable and is in satisfactory  condition to go home.  All of her other medical problems remain stable.           ______________________________  Vesta Mixer, M.D.     PJN/MEDQ  D:  07/30/2007  T:  07/31/2007  Job:  [id number]

## 2011-02-09 NOTE — Telephone Encounter (Signed)
When urinates it hurts, started over weekend. States only hurts when she finishes, urine dark.  In am has productive cough with dark sputum, ok during day except for some hoarseness.  Stopped lyrica last week.  States sputum doesn't look like blood, just dark and yellow.  Please advise

## 2011-02-09 NOTE — Telephone Encounter (Signed)
When spoke with patient, she actually increased her coumadin to daily and greens after last INR.  Will hold tonight and check INR tomorrow.

## 2011-02-09 NOTE — Discharge Summary (Signed)
NAME:  Catherine [name], Catherine [name]                ACCOUNT NO.:  [id number]   MEDICAL RECORD NO.:  [id number]          PATIENT TYPE:  INP   LOCATION:  3715                         FACILITY:  [facility]   PHYSICIAN:  Cassell Clement, M.D. DATE OF BIRTH:  Nov 01, 1934   DATE OF ADMISSION:  05/31/2008  DATE OF DISCHARGE:  06/03/2008                               DISCHARGE SUMMARY   FINAL DIAGNOSES:  1. Antibiotic-associated enterocolitis, positive for Clostridium      difficile.  2. Hypokalemia secondary to gastrointestinal fluid losses.  3. Mitral stenosis status post mitral commissurotomy in 1970.  4. Chronic Coumadin anticoagulation.  5. Chronic atrial fibrillation.  6. Essential hypertension.  7. Hyperlipidemia.  8. Postmenopausal state.  9. Asthma.   HISTORY:  This 76 year old married Caucasian female was admitted with  intractable diarrhea, hypokalemia, and suspected dehydration.  She has a  history of chronic atrial fibrillation and has been on long-term  Coumadin.  She has had a past history of rheumatic heart disease with  mitral stenosis and had a mitral commissurotomy in 1970.  Her last echo  in February 2009 showed a normal LV systolic function at 55-60%, a  mitral valve area of 2.3, and mild pulmonary hypertension with a right  ventricular systolic pressure of 41.  She was in her usual state of  health until she had a urinary tract infection a week prior to admission  and took Cipro 500 mg twice a day for 5 days.  On day that she finished  taking the Cipro, she had the onset of diarrhea, chills, fever, and a  temperature of 102.2.  She has been very nauseated and has not been able  to eat much but has not vomited.  She has had no sputum production.  She  has been under a lot of stress because of mother's hospitalized at  [facility] with intractable failure and is doing poorly.  We saw her as  urgent work-in in the office on May 29, 2008, at which time she was  complaining of severe  diarrhea and we sent off a stool for C. difficile  on that day and treated her symptomatically with p.r.n. Lomotil and  Phenergan.  She called back on May 31, 2008, stating that she was  unable to stay out of the bathroom because of repetitive watery, light-  colored stools and lower abdominal cramping and we made arrangements for  direct admission to [facility] for IV fluid resuscitation and further  evaluation.  Later on the day of admission, a stool for C. difficile  toxin which had been sent off on May 29, 2008, did come back  positive for C. Difficile.   MEDICATIONS AT THE TIME OF ADMISSION:  1. Coumadin 5 mg 6 days a week, none on the seventh day.  2. Premarin 0.3 mg daily.  3. Lanoxin 0.125 mg daily.  4. Maxzide 1 daily.  5. Multivitamin daily.  6. Vitamin C daily.  7. Atenolol 25 mg half tablet daily.  8. Potassium 10 mEq 4 times a day.  9. Lasix 40 mg 1 p.r.n. for severe fluid.  10.She  has Spiriva inhaler at home but has not been taking it.   PAST MEDICAL HISTORY:  Positive for a known calcified gallstone.  She  has also had a past history of back problem for which she has seen Dr.  Trey Sailors.   PHYSICAL EXAMINATION:  VITAL SIGNS:  Blood pressure 130/76, pulse is 70  and irregularly irregular, and she has a temperature of 98.  GENERAL:  She is a pale woman in no acute distress.  HEENT:  Mucous membranes dry.  SKIN:  There is no skin rash.  LUNGS:  Clear.  HEART:  Diastolic rumble of mitral stenosis.  ABDOMEN:  Soft and nontender with bowel sounds present.  EXTREMITIES:  No edema.  No phlebitis.  Pedal pulses are present.   INITIAL LABORATORY DATA:  Hemoglobin 14.6 and white count 8000.  Sodium  139, potassium 2.6, BUN 7, creatinine 0.82, and blood sugar 155.  INR is  2.1.  Digoxin level 0.7.  Stool is negative for occult blood.   HOSPITAL COURSE:  The patient was started on generic Flagyl 500 mg 3  times a day.  Her potassium was repleted orally.  Pharmacy  followed her  for her Coumadin requirements.  Her requirements were less while in the  hospital than they had been on an outpatient basis felt to be secondary  to poor oral intake as well as possible interaction with other meds such  as Flagyl.  The patient's diarrhea persisted until June 03, 2008.  On June 02, 2008, she was sent down for 3-way abdomen which was  unremarkable and showed no evidence of obstruction and she does have a  known calcified gallstone.  A followup stool for C. difficile obtained  June 01, 2008, came back negative for C. difficile.  White count and  temperature remained normal throughout the hospital stay.   At the time of discharge, her CBC shows a hemoglobin of 12, white count  9800, and platelet count 192,000.  Potassium 3.5, BUN 3 , creatinine  0.68, and INR is 3.4.  digoxin level was 0.7 on admission.   The patient is being discharged improved on a low-sodium, heart-healthy  diet.  She will see Dr. Patty Sermons in 1 week for a followup office visit,  protime and BMET.   DISCHARGE MEDICATIONS:  1. Potassium 10 mEq 4 tablets daily.  2. Premarin 0.3 mg daily.  3. Furosemide 40 mg if needed for severe swelling or shortness of      breath.  4. Lanoxin 0.125 mg twice a day, hold if pulse less than 60.  5. Atenolol 12.5 mg daily.  6. Maxzide 37.5/25 one daily.  7. Coumadin 5 mg taking half a tablet daily, starting June 04, 2008, or as directed.  8. Vitamin C 500 mg daily.  9. Multivitamin one daily.  10.Flagyl 500 mg one 3 times a day for 10 more days.   CONDITION ON DISCHARGE:  Improved.           ______________________________  Cassell Clement, M.D.     TB/MEDQ  D:  06/03/2008  T:  06/03/2008  Job:  [id number]

## 2011-02-09 NOTE — H&P (Signed)
NAME:  Catherine [name], Catherine [name]                ACCOUNT NO.:  [id number]   MEDICAL RECORD NO.:  [id number]          PATIENT TYPE:  INP   LOCATION:  3715                         FACILITY:  [facility]   PHYSICIAN:  Cassell Clement, M.D. DATE OF BIRTH:  August 12, 1935   DATE OF ADMISSION:  05/31/2008  DATE OF DISCHARGE:                              HISTORY & PHYSICAL   CHIEF COMPLAINT:  Diarrhea.   HISTORY:  This is a 76 year old married Caucasian female from [place],  [place] who is admitted with intractable diarrhea and hypokalemia  and dehydration.  She has a past history of known chronic atrial  fibrillation.  She has been on long-term Coumadin.  She has a past  history of rheumatic heart disease and mitral stenosis.  She had a  mitral commissurotomy in 1970 by Dr. Micah Noel and Dr. Bascom Levels at [facility].  Her last cardiac catheterization was in 1995 showing mitral  stenosis and normal coronary arteries.  Her last echocardiogram in  November 17, 2007 showed normal left ventricular systolic function of  55%-60% and a mitral valve area of 2.3 with a maximum diastolic gradient  of 12.7.  She has mild pulmonary hypertension with left ventricular  systolic pressure of 41.   The patient was in her usual state of health until last week when she  had urinary tract infection and took Cipro 500 mg twice a day for 5  days.  Today that she finished taking the Cipro, she had the onset of  diarrhea, chills, fever, and a temperature of 102.2.  She has also been  extremely nauseated, but has not vomited.  She has had no sputum  production.  She has been under a lot of stress also because her mother  is hospitalized at [facility] with intractable heart failure and  a fractured hip and is doing poorly.  The patient was seen in the office  as an urgent walk-in on May 29, 2008 complaining of severe  diarrhea, nausea, chills, fever, and profound weakness.  We sent off her  stool for C. difficile  toxin.  We treated her symptomatically with  p.r.n. Lomotil and p.r.n. Phenergan.  Because she has continued to do  poorly, she called back today stating she was weaker, unable to keep  down foods, and continuing to have to run to the bathroom with  repetitive watery light-colored stools.  Arrangements were made for her  admission to the hospital for IV resuscitation and further evaluation.  Later this afternoon, the results of the stool for C. difficile toxin  sent off on May 29, 2008 came back positive for C. difficile  suggesting that her current symptoms are related to pseudomembranous  enterocolitis.   PRESENT MEDICATIONS:  1. Coumadin 5 mg 6 days a week not on the seventh day.  2. Premarin 0.3 mg daily.  3. Lanoxin 0.125 mg b.i.d.  4. Generic Maxzide 1 daily.  5. Multivitamin daily.  6. Vitamin C daily.  7. Atenolol 25 mg half tablet daily.  8. Potassium 10 mEq 4 times a day.  9. She also has seen Dr.  Clinton Young for asthma and is on Spiriva 1      inhalation daily.   Family history is positive for mitral valve disease in the patient's  mother.   Social history reveals that the patient is married.  She works part time  as a Interior and spatial designer.   The past medical history reveals that she has had a known gallstone.  She has also had problem with chronic back problems for which, she has  seen Dr. Trey Sailors.   The remainder of the review of systems is negative in detail.   On physical examination, her blood pressure is 130/76, pulse is 70 and  irregular, temperature afebrile.  The general appearance reveals a pale,  middle-aged woman in no acute distress.  Mucous membranes are dry.  The  skin does not reveal any rash.  The pupils were equal and reactive.  Extraocular movements are full.  The fundi are unremarkable.  The mouth  and pharynx are normal.  Jugular venous pressure normal.  Carotids  normal.  Thyroid normal.  Chest is clear.  Her heart reveals a grade 2/6   diastolic murmur of mitral stenosis rumble.  The abdomen is soft and  nontender.  Bowel sounds are present.  Extremities show no edema.  No  phlebitis.  Pedal pulses are present.   ADMISSION LABORATORY DATA:  Hemoglobin 14.6, white count 8000, platelet  count 270,000.  Sodium 139, potassium 2.6, BUN 7, creatinine 0.82, blood  sugar 155.  INR is 2.1, which is therapeutic.  Liver function studies  are normal.  Albumin 3.8, calcium 8.6, digoxin level 0.7, stool for  occult blood is negative.   IMPRESSION:  1. Diarrhea secondary to antibiotic-associated enterocolitis positive      for Clostridium difficile.  2. Marked hypokalemia with potassium of 2.6 secondary to      gastrointestinal fluid losses.  3. Mitral stenosis status post mitral commissurotomy in 1970.  4. Essential hypertension.  5. Hyperlipidemia.  6. Postmenopausal state.  7. Asthma.  8. Chronic atrial fibrillation on Coumadin.   DISPOSITION:  We are admitting to [facility].  We will start generic Flagyl  500 mg 3 times a day.  We will replete her potassium orally.  We will  consider getting a GI consult if she shows any sign of failing to  improve promptly.  We will continue her on her Coumadin for her atrial  fib and mitral stenosis.           ______________________________  Cassell Clement, M.D.     TB/MEDQ  D:  05/31/2008  T:  06/01/2008  Job:  [id number]

## 2011-02-09 NOTE — Telephone Encounter (Signed)
Called patient and advised

## 2011-02-10 ENCOUNTER — Encounter: Payer: Self-pay | Admitting: Neurosurgery

## 2011-02-10 ENCOUNTER — Ambulatory Visit (INDEPENDENT_AMBULATORY_CARE_PROVIDER_SITE_OTHER): Payer: Medicare Other | Admitting: *Deleted

## 2011-02-10 DIAGNOSIS — I4891 Unspecified atrial fibrillation: Secondary | ICD-10-CM

## 2011-02-10 LAB — POCT INR: INR: 3.3

## 2011-02-10 NOTE — Progress Notes (Signed)
Patient coughed up blood this am.  Will hold coumadin tonight and then resume usual dose.  Continue levaquin and call back with update Friday.  Call back if fever or feeling worse.

## 2011-02-12 ENCOUNTER — Telehealth: Payer: Self-pay | Admitting: *Deleted

## 2011-02-12 NOTE — Op Note (Signed)
NAME:  [name], Catherine                ACCOUNT NO.:  [id number]   MEDICAL RECORD NO.:  [id number]          PATIENT TYPE:  AMB   LOCATION:  ENDO                         FACILITY:  [facility]   PHYSICIAN:  Petra Kuba, M.D.    DATE OF BIRTH:  [birth year]   DATE OF PROCEDURE:  10/18/2005  DATE OF DISCHARGE:                                 OPERATIVE REPORT   PROCEDURE PERFORMED:  Colonoscopy with polypectomy.   ENDOSCOPIST:  Petra Kuba, M.D.   INDICATIONS FOR PROCEDURE:  Patient with known polyps on Coumadin. Want to  get her on Lovenox and stop the Coumadin.  Consent was signed after the  risks, benefits, methods and options were thoroughly discussed in the office  on multiple occasions.   MEDICINES USED:  Demerol 100 mg, Versed 7.5 mg.   DESCRIPTION OF PROCEDURE:  Rectal inspection was pertinent for external  hemorrhoids, small.  Digital exam was negative.  A video pediatric  adjustable colonoscope was inserted and fairly easily with abdominal  pressure despite a tortuous sigmoid, advanced around the colon to the cecum.  No obvious abnormalities were seen on insertion. The cecum was identified by  the appendiceal orifice and the ileocecal valve.  The prep was adequate.  There was some liquid stool that required washing and suctioning.  In the  midascending the previously seen polyp was seen, snared, electrocautery  applied and the polyp was suctioned through the scope and collected in the  trap.  I am not sure if there was some residual polypoid tissue around the  edges but four hot biopsies were done around the edges and put in the same  container. The scope was then slowly withdrawn.  No other abnormalities were  seen as we slowly withdrew back to the rectum.  Then the previously seen  place at the rectosigmoid junction, the other sessile polyp was seen.  We  went ahead and snared it three different times since it was flatter than the  other polyp, only small pieces were obtained.  These were snared, removed,  suctioned through the scope and collected in the trap.  We then proceeded  with multiple hot biopsies and all of these tissue samples were put in the  second container.  Anorectal pull through and retroflexion confirmed the  small hemorrhoids.  Scope was inserted a short ways up the left side of the  colon, air was suctioned, scope removed.  The patient tolerated the  procedure well.  There was no immediate obvious complication.   ENDOSCOPIC DIAGNOSIS:  1.  Internal and external hemorrhoids.  2.  Rectosigmoid junction small to medium size polyp sessile status post      snare x3 and multiple hot biopsies.  Questionable completely removed.  3.  Ascending small to medium sized polyp, status post snare and hot biopsy      of the edge, probably completely removed.  4.  Otherwise within normal limits to the cecum.   PLAN:  Await pathology. Will use 2.5 of Coumadin for 10 days based on  significant cautery and continue Lovenox for seven days.  She has an  appointment on February 1 to recheck her Coumadin level which I think will  be appropriate.  Low residue diet for five days.  Await pathology to  determine future work-up plan.           ______________________________  Petra Kuba, M.D.     MEM/MEDQ  D:  10/18/2005  T:  10/18/2005  Job:  [id number]

## 2011-02-12 NOTE — Telephone Encounter (Signed)
Patient phoned stating she was feeling much better today.  Did cough a little blood up yesterday, but none today.  Did advise if fever go to urgent care.  Will have protime Monday. Will finish levaquin.

## 2011-02-12 NOTE — Op Note (Signed)
NAME:  Catherine [name], Catherine [name]                ACCOUNT NO.:  [id number]   MEDICAL RECORD NO.:  [id number]          PATIENT TYPE:  AMB   LOCATION:  ENDO                         FACILITY:  [facility]   PHYSICIAN:  Petra Kuba, M.D.    DATE OF BIRTH:  Feb 15, 1935   DATE OF PROCEDURE:  02/24/2005  DATE OF DISCHARGE:                                 OPERATIVE REPORT   PROCEDURE:  Colonoscopy with biopsy.   INDICATIONS FOR PROCEDURE:  Abdominal pain, long overdue for a colonic  screening.  Consent was signed after risks, benefits, methods and options  were thoroughly discussed in the office.   MEDICATIONS:  Demerol 100, Versed 7.5.   DESCRIPTION OF PROCEDURE:  Rectal inspection was pertinent for external  hemorrhoids, small.  Digital examination was negative. The video pediatric  adjustable colonoscope was inserted and with some difficulty due to a  tortuous looping colon, with rolling her on her back and various abdominal  pressures, was able to be advanced to the cecum.  On insertion, a moderate  sessile rectal polyp was seen but no other abnormalities.  The cecum was  identified by the appendiceal orifice and the ileocecal valve.  In fact, the  scope was inserted a short ways in the terminal ileum which was normal.  Photo documentation was obtained.  Scope was slowly withdrawn.  Prep was  adequate.  There was some liquid stool that required washing and suctioning.  In the mid ascending colon, a 1 cm sessile polyp was seen.  Because of her  need for chronic Coumadin and based on our discussion in the office, cold  biopsies only were obtained.  We did not try to be overly aggressive with  this polyp, although hot biopsies and possibly even the ERBE could be used  in the future.  It was not really amenable to snaring.  Multiple hot  biopsies were obtained and put in the first container.  No other  abnormalities were seen as we slowly withdrew back to the polyp which was at  the first rectal turn.   Again, this was more of 1.5 to 2 cm but sessile as  well and again could have been amenable to hot biopsy and probably the ERBE  Argon plasma coagulator but again based on her need for Coumadin and based  on our conversation prior to the procedure, multiple cold biopsies were  obtained and put in the second container.  Anorectal pull through and  retroflexion confirmed some small hemorrhoids.  Scope was straightened and  readvanced a short ways up the left side of the colon.  Air was suctioned  and scope removed.  The patient tolerated the procedure well.  There was no  obvious immediate complication.   ENDOSCOPIC DIAGNOSES:  1. Internal and external hemorrhoids.  2. Rectal 1.5 to 2 cm sessile polyp at the first rectal turn, cold      biopsied.  3. Ascending 1 cm sessile polyp cold biopsied.  4. Otherwise within normal limits to the terminal ileum.     PLAN:  Await pathology.  Since her pain is better,  will just follow her for  that. No further work-up at this time, although probably due to adhesions, I  might consider a small-bowel follow-through for that next and otherwise  pending pathology, will need to discuss more aggressive colonoscopy and  polypectomy in the future and again the risks of bleeding will need to be  thoroughly discussed.  Also, will probably need Lovenox use in the  periprocedural time.      MEM/MEDQ  D:  02/24/2005  T:  02/24/2005  Job:  [id number]   cc:   Cassell Clement, M.D.  [address], [address]  [place]  [place] [address]  Fax: [phone]   Luvenia Redden, M.D.  [address]  [place]  [place] [address]  Fax: [phone]

## 2011-02-12 NOTE — Consult Note (Signed)
NAME:  [name], [name]                          ACCOUNT NO.:  [id number]   MEDICAL RECORD NO.:  [id number]                   PATIENT TYPE:  EMS   LOCATION:  MAJO                                 FACILITY:  [facility]   PHYSICIAN:  Peter M. Swaziland, M.D.               DATE OF BIRTH:  07/25/1935   DATE OF CONSULTATION:  DATE OF DISCHARGE:                                   CONSULTATION   HISTORY OF PRESENT ILLNESS:  [name] is a pleasant 76 year old white  female with a history of chronic atrial fibrillation and mitral stenosis who  presents to the emergency room today with complaint of near syncope.  She  states she had an episode last Thursday where everything suddenly went  black.  She did not lose consciousness.  She felt drained.  She states this  lasted a few seconds and then resolved.  She felt fine until today when  these symptoms recurred.  Again, they lasted only a few seconds, but today  they kept recurring repeatedly.  Again, she did not lose consciousness.  EMS  was called.   On initial assessment, her heart rate was within normal limits and her blood  pressure was mildly elevated.  She subsequently developed recurrent symptoms  and it was noted that her heart rate had increased into the 180s.  She had  no chest pain or shortness of breath.  She denied nausea, vomiting,  diaphoresis, fever, or chills.  She did complain of a sore throat when she  got up this morning and started taking Zithromax, taking two pills with  breakfast.  She does have chronic atrial fibrillation and has been on  Lanoxin.  She is status post mitral commissurotomy in 1970.  She had a  recent echocardiogram on July 16, 2003 that showed mitral stenosis with  mitral valve area of 0.4 sq/cm and mean gradient of 4 mmHg.  She had  moderate mitral insufficiency and normal left ventricular function.  She had  mild pulmonary hypertension and right ventricular systolic pressure with 40  mmHg.   PAST MEDICAL  HISTORY:  1. Mitral stenosis, status post mitral valve commissurotomy in 1970.  2. Chronic atrial fibrillation.  3. Chronic Coumadin therapy.  4. Hypertension.  5. History of kidney stones.  6. History of gallstones.  7. Status post hysterectomy.  8. Cataract surgery.  9. Cervical disc surgery.   MEDICATIONS:  1. Coumadin 2.5 mg six out of seven days.  2. Premarin 0.03 mg q.d.  3. Lanoxin 0.25 mg q.d.  4. Maxzide 1 tablet b.i.d.  5. Lotensin 20 mg q.d.  6. Multivitamin q.d.   ALLERGIES:  Intolerance to PRONESTYL, QUINIDINE, VERAPAMIL, DILTIAZEM, and  AMOXICILLIN.   SOCIAL HISTORY:  The patient works as a Interior and spatial designer.  She is married.  She  has one adopted daughter.  She denies tobacco or alcohol use.   FAMILY HISTORY:  Her father died  of leukemia at age 27.   REVIEW OF SYMPTOMS:  As noted in the HPI.  Otherwise negative.   PHYSICAL EXAMINATION:  GENERAL:  The patient is a pleasant white female in  no apparent distress.  VITAL SIGNS:  Blood pressure 154/84, pulse 78, atrial fibrillation,  respirations were 20.  She is afebrile.  Saturations are 100%.  HEENT:  Pupils are equal, round, and reactive to light.  Conjunctivae are  clear.  Oropharynx is clear.  Tongue was midline.  NECK:  Supple without JVD, adenopathy, thyromegaly, or bruits.  LUNGS:  Clear to auscultation and percussion.  CARDIOVASCULAR:  Irregular rate and rhythm with a positive opening snap.  There is a grade 2/6 systolic murmur in the apex.  There is also a grade 2/6  systolic murmur in the apex.  ABDOMEN:  Soft and nontender.  There are no masses or bruits.  EXTREMITIES:  Without edema.  Pulses are 2+ and symmetric.  NEUROLOGICAL:  Alert and oriented x 4.  Cranial nerves II through XII  intact.  Sensory and motor exam are normal.   LABORATORY DATA:  Digoxin level was 0.9, PT was 27.6, with an INR of 3.9.  White count is 9800, hemoglobin is 13.7, hematocrit 39.5, platelets 253,000.  Cardiac enzymes were  negative x 3.  CMET is normal.  Sodium is 135,  potassium 3.6, chloride 97, CO2 31, BUN 9, creatinine 0.6, glucose of 97.   Chest x-ray shows mild cardiomegaly with old lower lobe scar.  No active  disease.  ECG shows atrial fibrillation with a rate of 78, nonspecific ST  and T-wave changes consistent with digoxin effect which is chronic.   IMPRESSION:  1. Symptoms of near syncope which correlate temporally with increased     ventricular response with atrial fibrillation.  2. Chronic atrial fibrillation with very mild mitral stenosis.  3. Hypertension.   PLAN:  We will check a TSH and free T4.  I have added Atenolol 25 mg per day  to her regimen.  We will give her a dose prior to discharge from the  emergency room and have her start oral dose in the morning. We will hold her  Coumadin tonight and resume her dose tomorrow.  We are asking that she  follow up with Dr. Cassell Clement later this week.  Discharge status is  improved.                                               Peter M. Swaziland, M.D.    PMJ/MEDQ  D:  08/13/2003  T:  08/13/2003  Job:  [id number]   cc:   Cassell Clement, M.D.  [address], [address]  [place]  [place] [address]  Fax: [phone]

## 2011-02-14 NOTE — Telephone Encounter (Signed)
Agree with plan 

## 2011-02-15 ENCOUNTER — Ambulatory Visit (INDEPENDENT_AMBULATORY_CARE_PROVIDER_SITE_OTHER): Payer: Medicare Other | Admitting: *Deleted

## 2011-02-15 DIAGNOSIS — I4891 Unspecified atrial fibrillation: Secondary | ICD-10-CM

## 2011-02-15 LAB — POCT INR: INR: 2.1

## 2011-02-25 ENCOUNTER — Telehealth: Payer: Self-pay | Admitting: Cardiology

## 2011-02-25 NOTE — Telephone Encounter (Signed)
Pt has had surgey and is taking amitryptyline hcl for pain she wants to know if this ok please call

## 2011-02-25 NOTE — Telephone Encounter (Signed)
Adv. Patient ok to take amitriptyline , Patient given by neurologist

## 2011-03-02 ENCOUNTER — Ambulatory Visit (INDEPENDENT_AMBULATORY_CARE_PROVIDER_SITE_OTHER): Payer: Medicare Other | Admitting: *Deleted

## 2011-03-02 DIAGNOSIS — I4891 Unspecified atrial fibrillation: Secondary | ICD-10-CM

## 2011-03-29 ENCOUNTER — Ambulatory Visit (INDEPENDENT_AMBULATORY_CARE_PROVIDER_SITE_OTHER): Payer: Medicare Other | Admitting: *Deleted

## 2011-03-29 DIAGNOSIS — I4891 Unspecified atrial fibrillation: Secondary | ICD-10-CM

## 2011-04-06 ENCOUNTER — Other Ambulatory Visit: Payer: Self-pay | Admitting: *Deleted

## 2011-04-06 NOTE — Telephone Encounter (Signed)
Called pt confirmed dose

## 2011-04-22 ENCOUNTER — Other Ambulatory Visit: Payer: Self-pay | Admitting: *Deleted

## 2011-04-22 DIAGNOSIS — I4891 Unspecified atrial fibrillation: Secondary | ICD-10-CM

## 2011-04-22 MED ORDER — FUROSEMIDE 80 MG PO TABS: 40.0000 mg | ORAL_TABLET | Freq: Two times a day (BID) | ORAL | Status: DC

## 2011-04-22 MED ORDER — DIGOXIN 250 MCG PO TABS: ORAL_TABLET | ORAL | Status: DC

## 2011-04-22 NOTE — Telephone Encounter (Signed)
Refilled meds per fax request.  

## 2011-04-28 ENCOUNTER — Ambulatory Visit (INDEPENDENT_AMBULATORY_CARE_PROVIDER_SITE_OTHER): Payer: Medicare Other | Admitting: Cardiology

## 2011-04-28 ENCOUNTER — Encounter: Payer: Self-pay | Admitting: Cardiology

## 2011-04-28 ENCOUNTER — Ambulatory Visit (INDEPENDENT_AMBULATORY_CARE_PROVIDER_SITE_OTHER): Payer: Medicare Other | Admitting: *Deleted

## 2011-04-28 DIAGNOSIS — M545 Low back pain, unspecified: Secondary | ICD-10-CM

## 2011-04-28 DIAGNOSIS — I4891 Unspecified atrial fibrillation: Secondary | ICD-10-CM

## 2011-04-28 DIAGNOSIS — I050 Rheumatic mitral stenosis: Secondary | ICD-10-CM

## 2011-04-28 DIAGNOSIS — E7800 Pure hypercholesterolemia, unspecified: Secondary | ICD-10-CM

## 2011-04-28 DIAGNOSIS — E785 Hyperlipidemia, unspecified: Secondary | ICD-10-CM | POA: Insufficient documentation

## 2011-04-28 DIAGNOSIS — I119 Hypertensive heart disease without heart failure: Secondary | ICD-10-CM

## 2011-04-28 LAB — POCT INR: INR: 3.7

## 2011-04-28 NOTE — Assessment & Plan Note (Signed)
The patient has a history of rheumatic fever and rheumatic heart disease.  She has mitral stenosis.  She had a previous mitral commissurotomy in 1970.  She has been on long-term Coumadin for chronic atrial fibrillation.  Since last visit she's having no new symptoms referable to her mitral stenosis or her atrial fibrillation.  She's had no TIA symptoms.  She's not having any symptoms of exacerbation of CHF

## 2011-04-28 NOTE — Assessment & Plan Note (Signed)
The patient has a history of low back pain.  She underwent back surgery by Dr. Venetia Maxon in March 2012.  He feels that her legs are getting a little stronger but she still has a lot of symptoms of dysesthesias and neuropathy in her feet particularly at night.  She has tried Lyrica and has tried Neurontin which she says have not helped but interestingly she had tried gabapentin which did seem to help a little bit.  Presently she is using an over-the-counter cream on her feet at night which has helped.  She will also tried a vitamin B12 complex pill.

## 2011-04-28 NOTE — Progress Notes (Signed)
Catherine [name] Date of Birth:  1935/05/20 [facility] [address]   [address] [place], [place]  [address] [phone]           Fax   [phone]  History of Present Illness: This pleasant 76 year old woman is seen for a scheduled followup office visit.  She has a history of known rheumatic heart disease and mitral stenosis.  She had a previous mitral commissurotomy in 1970.  She is in chronic atrial fibrillation and she is on long-term Coumadin.  Her last nuclear stress test was in 2009 and showed no evidence of reversible ischemia although her EKG shows a false positive EKG response.  Her last echocardiogram 01/26/10 shows ejection fraction of 55-60% and mitral valve area of 1.9 and normal pulmonary artery pressure of 35.  She's not expressing any symptoms of congestive heart failure.  Her energy level is good.  He does have some symptoms of peripheral neuropathy and has had previous problems with low back pain requiring surgery by neurosurgery Dr. Venetia Maxon  Current Outpatient Prescriptions  Medication Sig Dispense Refill  . Ascorbic Acid (VITAMIN C) 500 MG tablet Take 500 mg by mouth daily.        Marland Kitchen atenolol (TENORMIN) 25 MG tablet Take 25 mg by mouth daily. Taking 1/2 tab daily       . diazepam (VALIUM) 5 MG tablet Take 1 tablet (5 mg total) by mouth daily as needed for anxiety.  30 tablet  5  . digoxin (LANOXIN) 0.25 MG tablet 1/2 tablet daily  90 tablet  3  . furosemide (LASIX) 80 MG tablet Take 0.5 tablets (40 mg total) by mouth 2 (two) times daily.  90 tablet  3  . Multiple Vitamin (MULTIVITAMIN) tablet Take 1 tablet by mouth daily.        . Omega-3 Fatty Acids (FISH OIL) 1000 MG CAPS Take by mouth daily.        . potassium chloride (K-DUR,KLOR-CON) 10 MEQ tablet Take 10 mEq by mouth 5 (five) times daily.        Marland Kitchen triamterene-hydrochlorothiazide (MAXZIDE-25) 37.5-25 MG per tablet Take 1 tablet by mouth daily.        Marland Kitchen warfarin (COUMADIN) 5 MG tablet Take 5 mg  by mouth daily. Taking 2.5 mg as directed         Allergies  Allergen Reactions  . Ace Inhibitors   . Amoxicillin     tachy  . Ciprocin-Fluocin-Procin (Fluocinolone Acetonide)   . Codeine   . Diltiazem     ha  . Flagyl (Metronidazole Hcl)   . Flovent (Fluticasone Propionate)     Leg cramps  . Lotensin     cough  . Macrobid     nausea  . Quinidine     Fever diarrhea  . Verapamil     myalgias    Patient Active Problem List  Diagnoses  . RHEUMATIC MITRAL STENOSIS  . RHEUMATIC HEART DISEASE  . VALVULAR HEART DISEASE  . Atrial Fibrillation  . COPD  . DYSPNEA  . COUGH  . Atrial fibrillation  . Low back pain  . Benign hypertensive heart disease without heart failure  . Hypercholesterolemia    History  Smoking status  . Former Smoker  Smokeless tobacco  . Not on file    History  Alcohol Use     Family History  Problem Relation Age of Onset  . Leukemia Father     Review of Systems: Constitutional: no fever chills  diaphoresis or fatigue or change in weight.  Head and neck: no hearing loss, no epistaxis, no photophobia or visual disturbance. Respiratory: No cough, shortness of breath or wheezing. Cardiovascular: No chest pain peripheral edema, palpitations. Gastrointestinal: No abdominal distention, no abdominal pain, no change in bowel habits hematochezia or melena. Genitourinary: No dysuria, no frequency, no urgency, no nocturia. Musculoskeletal:No arthralgias, no back pain, no gait disturbance or myalgias. Neurological: No dizziness, no headaches, no numbness, no seizures, no syncope, no weakness, no tremors. Hematologic: No lymphadenopathy, no easy bruising. Psychiatric: No confusion, no hallucinations, no sleep disturbance.    Physical Exam: Filed Vitals:   04/28/11 0917  BP: 120/78  Pulse: 76   The general appearance feels a well-developed well-nourished woman in no distress.Pupils equal and reactive.   Extraocular Movements are full.  There is  no scleral icterus.  The mouth and pharynx are normal.  The neck is supple.  The carotids reveal no bruits.  The jugular venous pressure is normal.  The thyroid is not enlarged.  There is no lymphadenopathy.  The chest is clear to percussion and auscultation. There are no rales or rhonchi. Expansion of the chest is symmetrical.    Heart reveals a diastolic murmur of mitral stenosis at apex.The abdomen is soft and nontender. Bowel sounds are normal. The liver and spleen are not enlarged. There Are no abdominal masses. There are no bruits.  The pedal pulses are good.  There is no phlebitis or edema.  There is no cyanosis or clubbing.    She has trace edema and he and she has good peripheral pulses.Strength is normal and symmetrical in all extremities.  There is no lateralizing weakness.  There are no sensory deficits.  The skin is warm and dry.  There is no rash.    Assessment / Plan: The patient is to continue same medication.  Your continue her outpatient visits to the Coumadin clinic regarding her atrial fibrillation.  Recheck in 4 months for followup office visit andFasting lab work.  She will try adding vitamin B12 complex tablets to see if it will help her nocturnal peripheral neuropathy symptoms of burning feet.  Watch cholesterol and diet.  Her INR today is 3.7 secondary to eating dark cherries and she will avoid dose.  Try to lose weight

## 2011-05-26 ENCOUNTER — Ambulatory Visit (INDEPENDENT_AMBULATORY_CARE_PROVIDER_SITE_OTHER): Payer: Medicare Other | Admitting: *Deleted

## 2011-05-26 DIAGNOSIS — I4891 Unspecified atrial fibrillation: Secondary | ICD-10-CM

## 2011-05-26 LAB — POCT INR: INR: 2.9

## 2011-06-04 ENCOUNTER — Other Ambulatory Visit: Payer: Self-pay | Admitting: Cardiology

## 2011-06-04 DIAGNOSIS — I119 Hypertensive heart disease without heart failure: Secondary | ICD-10-CM

## 2011-06-04 DIAGNOSIS — I38 Endocarditis, valve unspecified: Secondary | ICD-10-CM

## 2011-06-04 DIAGNOSIS — E7800 Pure hypercholesterolemia, unspecified: Secondary | ICD-10-CM

## 2011-06-04 DIAGNOSIS — R0602 Shortness of breath: Secondary | ICD-10-CM

## 2011-06-04 DIAGNOSIS — I4891 Unspecified atrial fibrillation: Secondary | ICD-10-CM

## 2011-06-23 ENCOUNTER — Ambulatory Visit (INDEPENDENT_AMBULATORY_CARE_PROVIDER_SITE_OTHER): Payer: Medicare Other | Admitting: *Deleted

## 2011-06-23 DIAGNOSIS — I4891 Unspecified atrial fibrillation: Secondary | ICD-10-CM

## 2011-06-30 ENCOUNTER — Other Ambulatory Visit: Payer: Self-pay | Admitting: Cardiology

## 2011-06-30 LAB — BASIC METABOLIC PANEL
BUN: 3 — ABNORMAL LOW
BUN: 4 — ABNORMAL LOW
BUN: 5 — ABNORMAL LOW
CO2: 23
CO2: 26
Calcium: 8.3 — ABNORMAL LOW
Calcium: 8.5
Chloride: 108
Creatinine, Ser: 0.78
GFR calc non Af Amer: >60
GFR calc non Af Amer: >60
Glucose, Bld: 107 — ABNORMAL HIGH
Glucose, Bld: 107 — ABNORMAL HIGH
Glucose, Bld: 93
Potassium: 3.5
Potassium: 4.4
Sodium: 139
Sodium: 145

## 2011-06-30 LAB — COMPREHENSIVE METABOLIC PANEL
AST: 30
Alkaline Phosphatase: 59
BUN: 7
CO2: 27
Chloride: 103
Creatinine, Ser: 0.82
GFR calc Af Amer: >60
GFR calc non Af Amer: >60
Potassium: 2.6 — CL
Total Bilirubin: 1

## 2011-06-30 LAB — CBC
HCT: 35.3 — ABNORMAL LOW
HCT: 36.2
HCT: 42.5
Hemoglobin: 12.1
Hemoglobin: 12.3
MCHC: 33.9
MCHC: 34.2
MCV: 92
MCV: 92.9
Platelets: 192
Platelets: 197
Platelets: 240
RBC: 4.62
RDW: 12.5
RDW: 12.9
RDW: 13.2
WBC: 6.6
WBC: 8.8

## 2011-06-30 LAB — STOOL CULTURE

## 2011-06-30 LAB — URINE CULTURE: Special Requests: POSITIVE

## 2011-06-30 LAB — CLOSTRIDIUM DIFFICILE EIA
C difficile Toxins A+B, EIA: 5
C difficile Toxins A+B, EIA: NEGATIVE

## 2011-06-30 LAB — CULTURE, BLOOD (ROUTINE X 2): Culture: NO GROWTH

## 2011-06-30 LAB — PROTIME-INR
INR: 2.1 — ABNORMAL HIGH
INR: 3.6 — ABNORMAL HIGH
Prothrombin Time: 28.8 — ABNORMAL HIGH

## 2011-06-30 LAB — OCCULT BLOOD X 1 CARD TO LAB, STOOL: Fecal Occult Bld: NEGATIVE

## 2011-06-30 LAB — B-NATRIURETIC PEPTIDE (CONVERTED LAB): Pro B Natriuretic peptide (BNP): 149 — ABNORMAL HIGH

## 2011-07-05 ENCOUNTER — Telehealth: Payer: Self-pay | Admitting: Nurse Practitioner

## 2011-07-05 ENCOUNTER — Other Ambulatory Visit: Payer: Self-pay | Admitting: *Deleted

## 2011-07-05 ENCOUNTER — Ambulatory Visit: Payer: Medicare Other | Admitting: *Deleted

## 2011-07-05 ENCOUNTER — Telehealth: Payer: Self-pay | Admitting: Cardiology

## 2011-07-05 DIAGNOSIS — N390 Urinary tract infection, site not specified: Secondary | ICD-10-CM

## 2011-07-05 DIAGNOSIS — M545 Low back pain: Secondary | ICD-10-CM

## 2011-07-05 DIAGNOSIS — R300 Dysuria: Secondary | ICD-10-CM

## 2011-07-05 LAB — URINALYSIS
Bilirubin Urine: NEGATIVE
Glucose, UA: NEGATIVE mg/dL
Protein, ur: NEGATIVE mg/dL
Specific Gravity, Urine: 1.01 (ref 1.005–1.030)
pH: 7.5 (ref 5.0–8.0)

## 2011-07-05 NOTE — Telephone Encounter (Signed)
Pt called. Pt said she has a kidney infection and wants meds.

## 2011-07-05 NOTE — Telephone Encounter (Signed)
Has called with possible kidney infection. To get UA and culture today. Apparently has history of Cdiff as well. Will see what the labs show. Last antibiotic given was Levaquin according to paper chart.

## 2011-07-05 NOTE — Telephone Encounter (Signed)
SPOKE WITH Norma Fredrickson NP. PT  NEEDS TO COME IN FOR U/A WITH  C/S  WILL TX ONCE RESULTS  ARE BACK . PT VERBALIZED UNDERSTANDING/CY

## 2011-07-06 ENCOUNTER — Telehealth: Payer: Self-pay | Admitting: *Deleted

## 2011-07-06 LAB — CBC
HCT: 38.4
Platelets: 245
RBC: 4.18
WBC: 8.6

## 2011-07-06 LAB — I-STAT 8, (EC8 V) (CONVERTED LAB)
Bicarbonate: 27.6 — ABNORMAL HIGH
Glucose, Bld: 106 — ABNORMAL HIGH
Sodium: 138
TCO2: 29
pH, Ven: 7.409 — ABNORMAL HIGH

## 2011-07-06 LAB — POCT CARDIAC MARKERS
CKMB, poc: <1 — ABNORMAL LOW
CKMB, poc: <1 — ABNORMAL LOW
Myoglobin, poc: 51
Troponin i, poc: <0.05
Troponin i, poc: <0.05

## 2011-07-06 LAB — D-DIMER, QUANTITATIVE: D-Dimer, Quant: 0.3

## 2011-07-06 LAB — CK TOTAL AND CKMB (NOT AT ARMC): Relative Index: INVALID

## 2011-07-06 LAB — TROPONIN I: Troponin I: 0.01

## 2011-07-06 LAB — BASIC METABOLIC PANEL
BUN: 10
Chloride: 103
Glucose, Bld: 100 — ABNORMAL HIGH
Potassium: 3.8

## 2011-07-06 LAB — DIGOXIN LEVEL: Digoxin Level: 1.1

## 2011-07-06 LAB — LIPID PANEL
Cholesterol: 169
LDL Cholesterol: 94
VLDL: 24

## 2011-07-06 LAB — DIFFERENTIAL
Eosinophils Relative: 3
Lymphocytes Relative: 25
Lymphs Abs: 2.2
Monocytes Relative: 8

## 2011-07-06 LAB — POCT I-STAT CREATININE
Creatinine, Ser: 0.8
Operator id: 294511

## 2011-07-06 LAB — LIPASE, BLOOD: Lipase: 31

## 2011-07-06 LAB — PROTIME-INR: INR: 2 — ABNORMAL HIGH

## 2011-07-06 LAB — HEPATIC FUNCTION PANEL
AST: 34
Albumin: 3.6

## 2011-07-06 MED ORDER — LEVOFLOXACIN 250 MG PO TABS: 250.0000 mg | ORAL_TABLET | Freq: Every day | ORAL | Status: AC

## 2011-07-06 NOTE — Telephone Encounter (Signed)
Pt calling wanting to speak with [facility] regarding pt kidney infection. Please return pt call to discuss further.

## 2011-07-06 NOTE — Telephone Encounter (Signed)
Pt called wanting to speak to [facility] regarding pt kidney infection. Pt has been in the bed all day. Please return pt call ASAP.

## 2011-07-06 NOTE — Telephone Encounter (Signed)
Also, don't see anything scheduled for the U/A with C/S in [facility] scheduling if it is to be done at [facility].  You may want to forward this to whoever is in charge of scheduling appointments if there needs to be anything scheduled.  I am the courier for picking up charts for former [facility] patients.  I assume the original message is asking for a pickup of the chart.

## 2011-07-06 NOTE — Telephone Encounter (Signed)
Message copied by Burnell Blanks on Tue Jul 06, 2011  5:27 PM ------      Message from: Cassell Clement      Created: Tue Jul 06, 2011  9:26 AM       Please report.  The urine was clear, but did have some white cells.  We are waiting for the culture

## 2011-07-06 NOTE — Telephone Encounter (Signed)
Advised and Rx'd medication for symptoms

## 2011-07-06 NOTE — Telephone Encounter (Signed)
Advised patient Rx sent in.  Call back if no better

## 2011-07-06 NOTE — Telephone Encounter (Signed)
Unable to locate chart at [facility].  Will double check at [facility] for chart.

## 2011-07-08 ENCOUNTER — Telehealth: Payer: Self-pay | Admitting: *Deleted

## 2011-07-08 LAB — URINE CULTURE

## 2011-07-08 NOTE — Telephone Encounter (Signed)
Message copied by Burnell Blanks on Thu Jul 08, 2011  5:33 PM ------      Message from: Cassell Clement      Created: Thu Jul 08, 2011 12:17 PM       The culture showed that the bacteria was sensitive to Levaquin

## 2011-07-08 NOTE — Progress Notes (Signed)
Advised patient

## 2011-07-08 NOTE — Telephone Encounter (Signed)
Advised of culture results, states feeling much better

## 2011-07-09 ENCOUNTER — Other Ambulatory Visit: Payer: Self-pay | Admitting: Cardiology

## 2011-07-09 ENCOUNTER — Telehealth: Payer: Self-pay | Admitting: *Deleted

## 2011-07-09 NOTE — Telephone Encounter (Signed)
Advised, states feeling better

## 2011-07-09 NOTE — Telephone Encounter (Signed)
Message copied by Burnell Blanks on Fri Jul 09, 2011 10:18 AM ------      Message from: Cassell Clement      Created: Wed Jul 07, 2011  9:08 PM       The urine grew some E.Coli.  The Levoquin should take care of it.

## 2011-07-09 NOTE — Progress Notes (Signed)
Advised patient yesterday,

## 2011-07-21 ENCOUNTER — Ambulatory Visit (INDEPENDENT_AMBULATORY_CARE_PROVIDER_SITE_OTHER): Payer: Medicare Other | Admitting: *Deleted

## 2011-07-21 DIAGNOSIS — Z7901 Long term (current) use of anticoagulants: Secondary | ICD-10-CM

## 2011-07-21 DIAGNOSIS — I4891 Unspecified atrial fibrillation: Secondary | ICD-10-CM

## 2011-08-10 ENCOUNTER — Other Ambulatory Visit: Payer: Self-pay

## 2011-08-10 DIAGNOSIS — F419 Anxiety disorder, unspecified: Secondary | ICD-10-CM

## 2011-08-12 ENCOUNTER — Other Ambulatory Visit: Payer: Self-pay | Admitting: *Deleted

## 2011-08-12 DIAGNOSIS — F419 Anxiety disorder, unspecified: Secondary | ICD-10-CM

## 2011-08-15 MED ORDER — DIAZEPAM 5 MG PO TABS: 5.0000 mg | ORAL_TABLET | Freq: Every day | ORAL | Status: DC | PRN

## 2011-08-16 ENCOUNTER — Encounter: Payer: Medicare Other | Admitting: *Deleted

## 2011-08-16 NOTE — Telephone Encounter (Signed)
Pharmacy adv patient did get refill

## 2011-08-20 ENCOUNTER — Encounter: Payer: Self-pay | Admitting: Cardiology

## 2011-08-20 ENCOUNTER — Ambulatory Visit (INDEPENDENT_AMBULATORY_CARE_PROVIDER_SITE_OTHER): Payer: Medicare Other | Admitting: Cardiology

## 2011-08-20 ENCOUNTER — Ambulatory Visit (INDEPENDENT_AMBULATORY_CARE_PROVIDER_SITE_OTHER): Payer: Medicare Other | Admitting: *Deleted

## 2011-08-20 ENCOUNTER — Other Ambulatory Visit (INDEPENDENT_AMBULATORY_CARE_PROVIDER_SITE_OTHER): Payer: Medicare Other | Admitting: *Deleted

## 2011-08-20 ENCOUNTER — Other Ambulatory Visit: Payer: Self-pay | Admitting: Cardiology

## 2011-08-20 VITALS — BP 134/78 | HR 45 | Ht 64.0 in | Wt 144.0 lb

## 2011-08-20 DIAGNOSIS — Z7901 Long term (current) use of anticoagulants: Secondary | ICD-10-CM

## 2011-08-20 DIAGNOSIS — R05 Cough: Secondary | ICD-10-CM

## 2011-08-20 DIAGNOSIS — I119 Hypertensive heart disease without heart failure: Secondary | ICD-10-CM

## 2011-08-20 DIAGNOSIS — E7800 Pure hypercholesterolemia, unspecified: Secondary | ICD-10-CM

## 2011-08-20 DIAGNOSIS — R059 Cough, unspecified: Secondary | ICD-10-CM

## 2011-08-20 DIAGNOSIS — I4891 Unspecified atrial fibrillation: Secondary | ICD-10-CM

## 2011-08-20 DIAGNOSIS — I050 Rheumatic mitral stenosis: Secondary | ICD-10-CM

## 2011-08-20 DIAGNOSIS — I38 Endocarditis, valve unspecified: Secondary | ICD-10-CM

## 2011-08-20 DIAGNOSIS — R0602 Shortness of breath: Secondary | ICD-10-CM

## 2011-08-20 LAB — CBC WITH DIFFERENTIAL/PLATELET
Basophils Absolute: 0 10*3/uL (ref 0.0–0.1)
Eosinophils Absolute: 0.2 10*3/uL (ref 0.0–0.7)
HCT: 41.1 % (ref 36.0–46.0)
Hemoglobin: 14.1 g/dL (ref 12.0–15.0)
Lymphocytes Relative: 24.5 % (ref 12.0–46.0)
Lymphs Abs: 2.1 10*3/uL (ref 0.7–4.0)
MCHC: 34.3 g/dL (ref 30.0–36.0)
MCV: 93.2 fl (ref 78.0–100.0)
Monocytes Absolute: 0.8 10*3/uL (ref 0.1–1.0)
Neutro Abs: 5.3 10*3/uL (ref 1.4–7.7)
RDW: 13.4 % (ref 11.5–14.6)

## 2011-08-20 LAB — LIPID PANEL
Cholesterol: 204 mg/dL — ABNORMAL HIGH (ref 0–200)
VLDL: 25 mg/dL (ref 0.0–40.0)

## 2011-08-20 LAB — BASIC METABOLIC PANEL
BUN: 16 mg/dL (ref 6–23)
GFR: 72.41 mL/min (ref >60.00)
Potassium: 3.4 mEq/L — ABNORMAL LOW (ref 3.5–5.1)

## 2011-08-20 LAB — LDL CHOLESTEROL, DIRECT: Direct LDL: 139.2 mg/dL

## 2011-08-20 LAB — HEPATIC FUNCTION PANEL: Albumin: 4.4 g/dL (ref 3.5–5.2)

## 2011-08-20 LAB — POCT INR: INR: 1.8

## 2011-08-20 NOTE — Patient Instructions (Signed)
Your physician recommends that you continue on your current medications as directed. Please refer to the Current Medication list given to you today. Your physician recommends that you schedule a follow-up appointment in: 3 months with EKG   

## 2011-08-20 NOTE — Assessment & Plan Note (Signed)
The patient has a history of hypercholesterolemia.  We are checking fasting lab work today.  She is not presently on any statin therapy

## 2011-08-20 NOTE — Assessment & Plan Note (Signed)
Patient has an occasional nonproductive cough.  These symptoms have not worsened since last visit.  She denies any fever or chills.  No sputum production

## 2011-08-20 NOTE — Assessment & Plan Note (Signed)
The patient has not been having any TIA symptoms.  She notes occasional tachycardia palpitations when she first awakens in the morning.  If she takes a Valium her symptoms subside quickly.

## 2011-08-20 NOTE — Progress Notes (Signed)
Catherine [name] Date of Birth:  12-03-1934 [facility] [address]   [address] [place], [place]  [address] [phone]           Fax   [phone]  History of Present Illness: This pleasant 76 year old woman is seen for a scheduled four-month followup office visit.  She has a history of chronic atrial fibrillation and known rheumatic mitral stenosis.  She is on long-term Coumadin.  As has a history of high blood pressure and hypercholesterolemia.  Current Outpatient Prescriptions  Medication Sig Dispense Refill  . Ascorbic Acid (VITAMIN C) 500 MG tablet Take 500 mg by mouth daily.        Marland Kitchen atenolol (TENORMIN) 25 MG tablet Take 25 mg by mouth daily. Taking 1/2 tab daily       . diazepam (VALIUM) 5 MG tablet Take 1 tablet (5 mg total) by mouth daily as needed.  30 tablet  5  . digoxin (LANOXIN) 0.25 MG tablet 1/2 tablet daily  90 tablet  3  . furosemide (LASIX) 80 MG tablet Take 0.5 tablets (40 mg total) by mouth 2 (two) times daily.  90 tablet  3  . K-DUR 10 MEQ tablet TAKE 5 TABLETS DAILY  450 each  4  . Multiple Vitamin (MULTIVITAMIN) tablet Take 1 tablet by mouth daily.        Marland Kitchen triamterene-hydrochlorothiazide (MAXZIDE-25) 37.5-25 MG per tablet Take 1 tablet by mouth daily.        Marland Kitchen warfarin (COUMADIN) 5 MG tablet Take as directed by the Anticoagulation Clinic.  90 tablet  3    Allergies  Allergen Reactions  . Ace Inhibitors   . Amoxicillin     tachy  . Ciprocin-Fluocin-Procin (Fluocinolone Acetonide)   . Codeine   . Diltiazem     ha  . Flagyl (Metronidazole Hcl)   . Flovent (Fluticasone Propionate)     Leg cramps  . Lotensin     cough  . Macrobid     nausea  . Quinidine     Fever diarrhea  . Verapamil     myalgias    Patient Active Problem List  Diagnoses  . RHEUMATIC MITRAL STENOSIS  . RHEUMATIC HEART DISEASE  . VALVULAR HEART DISEASE  . Atrial Fibrillation  . COPD  . DYSPNEA  . COUGH  . Atrial fibrillation  . Low back pain    . Benign hypertensive heart disease without heart failure  . Hypercholesterolemia  . Encounter for long-term (current) use of anticoagulants    History  Smoking status  . Former Smoker  Smokeless tobacco  . Not on file    History  Alcohol Use     Family History  Problem Relation Age of Onset  . Leukemia Father     Review of Systems: Constitutional: no fever chills diaphoresis or fatigue or change in weight.  Head and neck: no hearing loss, no epistaxis, no photophobia or visual disturbance. Respiratory: No cough, shortness of breath or wheezing. Cardiovascular: No chest pain peripheral edema, palpitations. Gastrointestinal: No abdominal distention, no abdominal pain, no change in bowel habits hematochezia or melena. Genitourinary: No dysuria, no frequency, no urgency, no nocturia. Musculoskeletal:No arthralgias, no back pain, no gait disturbance or myalgias. Neurological: No dizziness, no headaches, no numbness, no seizures, no syncope, no weakness, no tremors. Hematologic: No lymphadenopathy, no easy bruising. Psychiatric: No confusion, no hallucinations, no sleep disturbance.    Physical Exam: Filed Vitals:   08/20/11 1038  BP: 134/78  Pulse:  45   the general appearance reveals a well-developed well-nourished woman in no distress.Pupils equal and reactive.   Extraocular Movements are full.  There is no scleral icterus.  The mouth and pharynx are normal.  The neck is supple.  The carotids reveal no bruits.  The jugular venous pressure is normal.  The thyroid is not enlarged.  There is no lymphadenopathy.  The chest is clear to percussion and auscultation. There are no rales or rhonchi. Expansion of the chest is symmetrical.  Heart reveals a soft rumble of mitral stenosis and a soft apical systolic murmur of mitral regurgitation.  No gallop.  Rhythm is irregular.The abdomen is soft and nontender. Bowel sounds are normal. The liver and spleen are not enlarged. There Are  no abdominal masses. There are no bruits.  Breasts reveal no masses. The pedal pulses are good.  There is no phlebitis or edema.  There is no cyanosis or clubbing. Strength is normal and symmetrical in all extremities.  There is no lateralizing weakness.  There are no sensory deficits.     Assessment / Plan: Continue same medication.  Await results of lab work.  We gave her a prescription at her request for the shingles vaccine.  Recheck in 3 months for office visit and EKG

## 2011-08-20 NOTE — Assessment & Plan Note (Signed)
The patient's prothrombin times have not been in stable in the last several months.  She is trying very hard to maintain a steady amount of green vegetables from one week for the next.  She has not had any TIA symptoms.

## 2011-08-23 ENCOUNTER — Other Ambulatory Visit: Payer: Self-pay | Admitting: *Deleted

## 2011-08-23 ENCOUNTER — Telehealth: Payer: Self-pay | Admitting: *Deleted

## 2011-08-23 DIAGNOSIS — E876 Hypokalemia: Secondary | ICD-10-CM

## 2011-08-23 DIAGNOSIS — E7800 Pure hypercholesterolemia, unspecified: Secondary | ICD-10-CM

## 2011-08-23 MED ORDER — POTASSIUM CHLORIDE ER 10 MEQ PO TBCR: EXTENDED_RELEASE_TABLET | ORAL | Status: DC

## 2011-08-23 NOTE — Telephone Encounter (Signed)
Advised patient

## 2011-08-23 NOTE — Telephone Encounter (Signed)
Message copied by Burnell Blanks on Mon Aug 23, 2011  5:46 PM ------      Message from: Cassell Clement      Created: Sat Aug 21, 2011 12:39 PM       The K is still low.  Increase K to two tabs TID [10 meq tabs]      BS is better.Cholesterol higher.  LDL higher.      LFTs nl.      CBC normal.

## 2011-08-30 ENCOUNTER — Other Ambulatory Visit: Payer: Medicare Other | Admitting: *Deleted

## 2011-08-30 ENCOUNTER — Ambulatory Visit: Payer: Medicare Other | Admitting: Cardiology

## 2011-09-03 ENCOUNTER — Other Ambulatory Visit: Payer: Self-pay | Admitting: Cardiology

## 2011-09-03 NOTE — Telephone Encounter (Signed)
Refilled tenormin 

## 2011-09-10 ENCOUNTER — Ambulatory Visit (INDEPENDENT_AMBULATORY_CARE_PROVIDER_SITE_OTHER): Payer: Medicare Other | Admitting: *Deleted

## 2011-09-10 DIAGNOSIS — Z7901 Long term (current) use of anticoagulants: Secondary | ICD-10-CM

## 2011-09-10 DIAGNOSIS — I4891 Unspecified atrial fibrillation: Secondary | ICD-10-CM

## 2011-09-10 LAB — POCT INR: INR: 2.7

## 2011-10-01 ENCOUNTER — Other Ambulatory Visit: Payer: Self-pay | Admitting: Cardiology

## 2011-10-01 DIAGNOSIS — Z1231 Encounter for screening mammogram for malignant neoplasm of breast: Secondary | ICD-10-CM

## 2011-10-08 ENCOUNTER — Ambulatory Visit (INDEPENDENT_AMBULATORY_CARE_PROVIDER_SITE_OTHER): Payer: Medicare Other | Admitting: *Deleted

## 2011-10-08 DIAGNOSIS — I4891 Unspecified atrial fibrillation: Secondary | ICD-10-CM

## 2011-10-18 ENCOUNTER — Ambulatory Visit
Admission: RE | Admit: 2011-10-18 | Discharge: 2011-10-18 | Disposition: A | Discharge status: Home / Self Care | Payer: Medicare Other | Source: Ambulatory Visit | Attending: Cardiology | Admitting: Cardiology

## 2011-10-18 DIAGNOSIS — Z1231 Encounter for screening mammogram for malignant neoplasm of breast: Secondary | ICD-10-CM

## 2011-11-18 ENCOUNTER — Ambulatory Visit: Payer: Medicare Other | Admitting: Cardiology

## 2011-11-19 ENCOUNTER — Ambulatory Visit (INDEPENDENT_AMBULATORY_CARE_PROVIDER_SITE_OTHER): Payer: Medicare Other | Admitting: Cardiology

## 2011-11-19 ENCOUNTER — Ambulatory Visit (INDEPENDENT_AMBULATORY_CARE_PROVIDER_SITE_OTHER): Payer: Medicare Other | Admitting: Pharmacist

## 2011-11-19 ENCOUNTER — Other Ambulatory Visit (INDEPENDENT_AMBULATORY_CARE_PROVIDER_SITE_OTHER): Payer: Medicare Other

## 2011-11-19 VITALS — BP 142/82 | HR 55 | Ht 64.0 in | Wt 145.0 lb

## 2011-11-19 DIAGNOSIS — I4891 Unspecified atrial fibrillation: Secondary | ICD-10-CM

## 2011-11-19 DIAGNOSIS — Z7901 Long term (current) use of anticoagulants: Secondary | ICD-10-CM

## 2011-11-19 DIAGNOSIS — E7800 Pure hypercholesterolemia, unspecified: Secondary | ICD-10-CM

## 2011-11-19 DIAGNOSIS — I050 Rheumatic mitral stenosis: Secondary | ICD-10-CM

## 2011-11-19 DIAGNOSIS — I119 Hypertensive heart disease without heart failure: Secondary | ICD-10-CM

## 2011-11-19 LAB — BASIC METABOLIC PANEL
BUN: 15 mg/dL (ref 6–23)
CO2: 31 mEq/L (ref 19–32)
Calcium: 9.6 mg/dL (ref 8.4–10.5)
Creatinine, Ser: 0.7 mg/dL (ref 0.4–1.2)
Glucose, Bld: 95 mg/dL (ref 70–99)

## 2011-11-19 LAB — POCT INR: INR: 2.1

## 2011-11-19 NOTE — Progress Notes (Signed)
Catherine [name] Date of Birth:  [birth year] [facility] [address] [place], [place]  [address] [phone]         Fax   [phone]  History of Present Illness: This pleasant 77 year old woman is seen for a three-month followup office visit.  She has a history of chronic atrial fibrillation and a history of rheumatic heart disease with mitral stenosis.  She is status post mitral commissurotomy in 1970.  She has been on long-term Coumadin because of her mitral stenosis as well as her atrial fibrillation.  She has not had any thromboembolic episodes.  She has a history of essential hypertension.  She has a history of neuropathic leg pain secondary to previous low back problems and neurosurgery.  Since last visit she has been doing well.  She's had no new cardiac symptoms.  Current Outpatient Prescriptions  Medication Sig Dispense Refill  . Ascorbic Acid (VITAMIN C) 500 MG tablet Take 500 mg by mouth daily.        . diazepam (VALIUM) 5 MG tablet Take 1 tablet (5 mg total) by mouth daily as needed.  30 tablet  5  . digoxin (LANOXIN) 0.25 MG tablet 1/2 tablet daily  90 tablet  3  . furosemide (LASIX) 80 MG tablet Take 0.5 tablets (40 mg total) by mouth 2 (two) times daily.  90 tablet  3  . Multiple Vitamin (MULTIVITAMIN) tablet Take 1 tablet by mouth daily.        . potassium chloride (K-DUR) 10 MEQ tablet 2 tablets three times a day  360 tablet  11  . TENORMIN 25 MG tablet TAKE (1/2) TABLET DAILY.  90 each  3  . triamterene-hydrochlorothiazide (MAXZIDE-25) 37.5-25 MG per tablet Take 1 tablet by mouth daily.        Marland Kitchen warfarin (COUMADIN) 5 MG tablet Take as directed by the Anticoagulation Clinic.  90 tablet  3    Allergies  Allergen Reactions  . Ace Inhibitors   . Amoxicillin     tachy  . Ciprocin-Fluocin-Procin (Fluocinolone Acetonide)   . Codeine   . Diltiazem     ha  . Flagyl (Metronidazole Hcl)   . Flovent (Fluticasone Propionate)     Leg cramps  .  Lotensin     cough  . Macrobid     nausea  . Quinidine     Fever diarrhea  . Verapamil     myalgias    Patient Active Problem List  Diagnoses  . RHEUMATIC MITRAL STENOSIS  . RHEUMATIC HEART DISEASE  . VALVULAR HEART DISEASE  . Atrial Fibrillation  . COPD  . DYSPNEA  . COUGH  . Atrial fibrillation  . Low back pain  . Benign hypertensive heart disease without heart failure  . Hypercholesterolemia  . Encounter for long-term (current) use of anticoagulants    History  Smoking status  . Former Smoker  Smokeless tobacco  . Not on file    History  Alcohol Use     Family History  Problem Relation Age of Onset  . Leukemia Father     Review of Systems: Constitutional: no fever chills diaphoresis or fatigue or change in weight.  Head and neck: no hearing loss, no epistaxis, no photophobia or visual disturbance. Respiratory: No cough, shortness of breath or wheezing. Cardiovascular: No chest pain peripheral edema, palpitations. Gastrointestinal: No abdominal distention, no abdominal pain, no change in bowel habits hematochezia or melena. Genitourinary: No dysuria, no frequency, no urgency, no nocturia. Musculoskeletal:No arthralgias, no  back pain, no gait disturbance or myalgias. Neurological: No dizziness, no headaches, no numbness, no seizures, no syncope, no weakness, no tremors. Hematologic: No lymphadenopathy, no easy bruising. Psychiatric: No confusion, no hallucinations, no sleep disturbance.    Physical Exam: Filed Vitals:   11/19/11 0957  BP: 142/82  Pulse: 55   the general appearance reveals a well-developed well-nourished woman in no distress .  Pupils equal and reactive.   Extraocular Movements are full.  There is no scleral icterus.  The mouth and pharynx are normal.  The neck is supple.  The carotids reveal no bruits.  The jugular venous pressure is normal.  The thyroid is not enlarged.  There is no lymphadenopathy.  The chest is clear to percussion  and auscultation. There are no rales or rhonchi. Expansion of the chest is symmetrical.  The heart reveals a grade 2/6 holosystolic murmur of mitral regurgitation and a grade 2/6 diastolic rumble mitral stenosis at the apex.  No gallop or rub. The abdomen is soft and nontender. Bowel sounds are normal. The liver and spleen are not enlarged. There Are no abdominal masses. There are no bruits.  The pedal pulses are good.  There is no phlebitis or edema.  There is no cyanosis or clubbing. Strength is normal and symmetrical in all extremities.  There is no lateralizing weakness.  There are no sensory deficits.  The skin is warm and dry.  There is no rash.  EKG shows atrial fibrillation with a slow ventricular response and nonspecific ST-T wave abnormalities.   Assessment / Plan: The patient is to continue same medication.  We are checking a basal metabolic panel today because of previous hypokalemia.  She will return in 6 weeks for a followup prothrombin and get her instructions about Lovenox bridging at that time.  We will plan to see her for an office visit and fasting lipid panel and chemistries in 3 months.

## 2011-11-19 NOTE — Assessment & Plan Note (Signed)
The patient has not been experiencing any symptoms of congestive heart failure.  She's had no thromboembolic episodes.  She is not having any chest pain.  She does have occasional brief episodes of dyspnea not necessarily associated with activity and relieved by taking a half of a Valium.

## 2011-11-19 NOTE — Patient Instructions (Signed)
Will obtain labs today and call you with the results   Your physician recommends that you continue on your current medications as directed. Please refer to the Current Medication list given to you today.  Your physician recommends that you schedule a follow-up appointment in: 3 months with fasting labs (LP/BMET/HFP)  

## 2011-11-19 NOTE — Assessment & Plan Note (Signed)
The patient has not been having any symptoms from her high blood pressure.  She denies any headaches or dizziness.

## 2011-11-19 NOTE — Assessment & Plan Note (Signed)
The patient has a past history of hypercholesterolemia.  She is not on an statin therapy.  We will plan to recheck fasting lipids in 3 months.  She is on a low cholesterol diet and exercises 5 days a week

## 2011-11-19 NOTE — Assessment & Plan Note (Signed)
The patient is on long-term Coumadin.  She's having no problems from the Coumadin.  In April she plans to have a colonoscopy.  She had one 3 years ago and polyps were found.  She will need Lovenox bridging prior to colonoscopy and this will be handled through the Coumadin clinic again.  She is familiar with Lovenox bridging because she had to do it prior to epidural steroid shots in the past.

## 2011-11-22 ENCOUNTER — Telehealth: Payer: Self-pay | Admitting: *Deleted

## 2011-11-22 ENCOUNTER — Encounter: Payer: Self-pay | Admitting: Family Medicine

## 2011-11-22 ENCOUNTER — Ambulatory Visit (INDEPENDENT_AMBULATORY_CARE_PROVIDER_SITE_OTHER): Payer: Medicare Other | Admitting: Family Medicine

## 2011-11-22 DIAGNOSIS — I099 Rheumatic heart disease, unspecified: Secondary | ICD-10-CM

## 2011-11-22 DIAGNOSIS — E876 Hypokalemia: Secondary | ICD-10-CM

## 2011-11-22 DIAGNOSIS — I4891 Unspecified atrial fibrillation: Secondary | ICD-10-CM

## 2011-11-22 DIAGNOSIS — Z299 Encounter for prophylactic measures, unspecified: Secondary | ICD-10-CM

## 2011-11-22 MED ORDER — PNEUMOCOCCAL VAC POLYVALENT 25 MCG/0.5ML IJ INJ: 0.5000 mL | INJECTION | Freq: Once | INTRAMUSCULAR | Status: DC

## 2011-11-22 NOTE — Progress Notes (Signed)
  Subjective:    Patient ID: Catherine [name], female    DOB: 1935-03-15, 77 y.o.   MRN: [id number]  HPI  Pt here to establish care. She has history of hypertension, atrial fibrillation, mitral valve stenosis related rheumatic fever with history of mitral valve replacement. Remote history of kidney stone. She is on chronic Coumadin followed by Coumadin clinic. Other medications reviewed. She has chronic insomnia treated with low-dose diazepam. Immunizations reviewed. Cannot confirm prior Pneumovax. Gets yearly flu vaccines. Shingles vaccine up-to-date.  She walks regularly for exercise. No recent chest pain. No dizziness. Blood pressures have been well controlled. Past Medical History  Diagnosis Date  . Hypertension   . Arrhythmia   . Heart murmur    Past Surgical History  Procedure Date  . Cardiac catheterization   . Back surgery     neurosurgery  . Commissurotomy 1970  . Other surgical history     hysterectomy  . Cataract surg     reports that she has quit smoking. She does not have any smokeless tobacco history on file. Her alcohol and drug histories not on file. family history includes Leukemia in her father. Allergies  Allergen Reactions  . Ace Inhibitors   . Amoxicillin     tachy  . Ciprocin-Fluocin-Procin (Fluocinolone Acetonide)   . Codeine   . Diltiazem     ha  . Flagyl (Metronidazole Hcl)   . Flovent (Fluticasone Propionate)     Leg cramps  . Lotensin     cough  . Macrobid     nausea  . Quinidine     Fever diarrhea  . Verapamil     myalgias      Review of Systems  Constitutional: Negative for fever, appetite change, fatigue and unexpected weight change.  Respiratory: Negative for cough and shortness of breath.   Cardiovascular: Negative for chest pain, palpitations and leg swelling.  Gastrointestinal: Negative for abdominal pain.  Genitourinary: Negative for dyspareunia.  Neurological: Negative for dizziness and weakness.  Hematological: Negative for  adenopathy. Does not bruise/bleed easily.       Objective:   Physical Exam  Constitutional: She appears well-developed and well-nourished.  HENT:  Mouth/Throat: Oropharynx is clear and moist.  Cardiovascular: Normal rate and regular rhythm.   Pulmonary/Chest: Effort normal and breath sounds normal. No respiratory distress. She has no wheezes. She has no rales.  Musculoskeletal: She exhibits no edema.          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 History of atrial fibrillation on Coumadin. Patient is considering transferring Coumadin care here eventually because of cost issues. #2 hypertension stable  #3 health maintenance. Cannot confirm prior Pneumovax. Pneumovax given today. Patient is in process of scheduling repeat colonoscopy

## 2011-11-22 NOTE — Telephone Encounter (Signed)
Left message

## 2011-11-26 MED ORDER — POTASSIUM CHLORIDE ER 10 MEQ PO TBCR: EXTENDED_RELEASE_TABLET | ORAL | Status: DC

## 2011-11-26 MED ORDER — SPIRONOLACTONE 25 MG PO TABS: 25.0000 mg | ORAL_TABLET | Freq: Every day | ORAL | Status: DC

## 2011-11-26 NOTE — Telephone Encounter (Signed)
I spoke with pt about lab work drawn on 11/19/11.   She will start spironolactone today and decrease her potassium tablets per Dr. Yevonne Pax order.  She will also stop Maxzide starting today.   Prescription called in and reassurance given to patient. Return on 12/02/11 for a bmet. Mylo Red RN

## 2011-11-26 NOTE — Telephone Encounter (Signed)
F/U   Patient returning nurse MP call, she can be reached at hm# [phone].

## 2011-11-29 ENCOUNTER — Telehealth: Payer: Self-pay | Admitting: Cardiology

## 2011-11-29 ENCOUNTER — Telehealth: Payer: Self-pay | Admitting: *Deleted

## 2011-11-29 NOTE — Telephone Encounter (Signed)
Yesterday felt awful, legs hurting, weak, hard time breathing, thought she was going to have to go to ED.  Weight down 4 pounds since yesterday and feels better today.  Did cut her K+ back to 4 daily with the change to Spironolactone.  Is scheduled for follow labs on Thursday.  Will forward to  Dr. Patty Sermons for review

## 2011-11-29 NOTE — Telephone Encounter (Signed)
Since She is feeling better today let us hold to her present medication and see how the lab work turns out later this week.

## 2011-11-29 NOTE — Telephone Encounter (Signed)
Advised patient

## 2011-11-29 NOTE — Progress Notes (Signed)
Addended by: Judithe Modest D on: 11/29/2011 09:56 AM   Modules accepted: Orders

## 2011-11-29 NOTE — Telephone Encounter (Signed)
Message copied by Burnell Blanks on Mon Nov 29, 2011  9:43 AM ------      Message from: Cassell Clement      Created: Mon Nov 22, 2011  8:34 AM       Please report.  The potassium is still very low even though she is taking 6 potassium pills a day.  I want her to stop the Maxzide and I want her to start spironolactone 25 mg one daily.  As she starts the spironolactone she should decrease her potassium pills to just 4 pills per day.  I want her to get a BMET in one week

## 2011-11-29 NOTE — Telephone Encounter (Signed)
Results given to patient by Mylo Red RN on 3/1

## 2011-11-29 NOTE — Telephone Encounter (Signed)
Pt wants a call re spirolactone, having side effects, headaches, heart out of rhythm, pls call

## 2011-11-30 ENCOUNTER — Ambulatory Visit (INDEPENDENT_AMBULATORY_CARE_PROVIDER_SITE_OTHER): Payer: Medicare Other | Admitting: Family Medicine

## 2011-11-30 ENCOUNTER — Encounter: Payer: Self-pay | Admitting: Family Medicine

## 2011-11-30 VITALS — BP 150/78 | Temp 98.4°F | Wt 150.0 lb

## 2011-11-30 DIAGNOSIS — R300 Dysuria: Secondary | ICD-10-CM

## 2011-11-30 DIAGNOSIS — N390 Urinary tract infection, site not specified: Secondary | ICD-10-CM

## 2011-11-30 LAB — POCT URINALYSIS DIPSTICK
Bilirubin, UA: NEGATIVE
Glucose, UA: NEGATIVE
Ketones, UA: NEGATIVE
Spec Grav, UA: 1.015
Urobilinogen, UA: 0.2

## 2011-11-30 NOTE — Progress Notes (Signed)
  Subjective:    Patient ID: Catherine [name], female    DOB: 03/11/1935, 77 y.o.   MRN: [id number]  HPI  Acute visit. Two day history of dysuria. Denies any burning with urination and really is having more mild intermittent suprapubic discomfort. Denies any urine frequency. Has some chronic mild urine stress incontinence which is unchanged. Denies any fever or chills. No nausea or vomiting. No vaginal spotting or discharge. No itching. She's had previous total abdominal hysterectomy.  She is due for repeat colonoscopy within the next month   Review of Systems  Constitutional: Negative for fever, chills and appetite change.  Respiratory: Negative for cough and shortness of breath.   Cardiovascular: Negative for chest pain.  Gastrointestinal: Negative for nausea, vomiting, diarrhea and constipation.  Genitourinary: Positive for dysuria. Negative for frequency, flank pain, decreased urine volume, vaginal bleeding, vaginal discharge and difficulty urinating.  Musculoskeletal: Negative for back pain.  Neurological: Negative for dizziness.       Objective:   Physical Exam  Constitutional: She appears well-developed and well-nourished.  Cardiovascular: Normal rate and regular rhythm.   Pulmonary/Chest: Effort normal and breath sounds normal. No respiratory distress. She has no wheezes. She has no rales.  Abdominal: Soft. She exhibits no distension and no mass. There is no tenderness. There is no rebound and no guarding.          Assessment & Plan:  Dysuria. Urine dipstick reveals no evidence for leukocytes or nitrites. She does have trace blood which is likely insignificant. Doubt UTI but send for urine culture. No antibiotics at this time. She is encouraged to proceed with getting colonoscopy scheduled

## 2011-11-30 NOTE — Patient Instructions (Signed)
Follow up promptly for any fever or worsening symptoms 

## 2011-12-01 ENCOUNTER — Other Ambulatory Visit (INDEPENDENT_AMBULATORY_CARE_PROVIDER_SITE_OTHER): Payer: Medicare Other

## 2011-12-01 DIAGNOSIS — E876 Hypokalemia: Secondary | ICD-10-CM

## 2011-12-01 LAB — BASIC METABOLIC PANEL
CO2: 29 mEq/L (ref 19–32)
Chloride: 103 mEq/L (ref 96–112)
Potassium: 3.9 mEq/L (ref 3.5–5.1)
Sodium: 141 mEq/L (ref 135–145)

## 2011-12-02 ENCOUNTER — Other Ambulatory Visit: Payer: Medicare Other

## 2011-12-02 LAB — URINE CULTURE
Colony Count: NO GROWTH
Organism ID, Bacteria: NO GROWTH

## 2011-12-02 NOTE — Progress Notes (Signed)
Quick Note:  Pt informed ______ 

## 2011-12-02 NOTE — Progress Notes (Signed)
Quick Note:  Please report to patient. The recent labs are stable. Continue same medication and careful diet. K is up to 3.9 good ______

## 2011-12-03 ENCOUNTER — Telehealth: Payer: Self-pay | Admitting: *Deleted

## 2011-12-03 NOTE — Telephone Encounter (Signed)
Message copied by Burnell Blanks on Fri Dec 03, 2011  3:16 PM ------      Message from: Cassell Clement      Created: Thu Dec 02, 2011 12:50 PM       Please report to patient.  The recent labs are stable. Continue same medication and careful diet. K is up to 3.9 good

## 2011-12-03 NOTE — Telephone Encounter (Signed)
Advised patient

## 2011-12-27 ENCOUNTER — Telehealth: Payer: Self-pay | Admitting: Pharmacist

## 2011-12-27 NOTE — Telephone Encounter (Signed)
Pt is scheduled to have colonoscopy on 4/12 by Dr Ewing Schlein.  Per Dr. Patty Sermons, pt will need Lovenox bridge. Pt has appt on 4/5 to check INR.  Will set up Lovenox bridge at that time.

## 2011-12-31 ENCOUNTER — Ambulatory Visit (INDEPENDENT_AMBULATORY_CARE_PROVIDER_SITE_OTHER): Payer: Medicare Other

## 2011-12-31 ENCOUNTER — Encounter: Payer: Self-pay | Admitting: Nurse Practitioner

## 2011-12-31 ENCOUNTER — Ambulatory Visit (INDEPENDENT_AMBULATORY_CARE_PROVIDER_SITE_OTHER): Payer: Medicare Other | Admitting: Nurse Practitioner

## 2011-12-31 VITALS — BP 164/80 | HR 68 | Ht 64.0 in | Wt 149.0 lb

## 2011-12-31 DIAGNOSIS — R0609 Other forms of dyspnea: Secondary | ICD-10-CM

## 2011-12-31 DIAGNOSIS — R0600 Dyspnea, unspecified: Secondary | ICD-10-CM

## 2011-12-31 DIAGNOSIS — I38 Endocarditis, valve unspecified: Secondary | ICD-10-CM

## 2011-12-31 DIAGNOSIS — R5383 Other fatigue: Secondary | ICD-10-CM

## 2011-12-31 DIAGNOSIS — R5381 Other malaise: Secondary | ICD-10-CM

## 2011-12-31 DIAGNOSIS — I4891 Unspecified atrial fibrillation: Secondary | ICD-10-CM

## 2011-12-31 DIAGNOSIS — Z7901 Long term (current) use of anticoagulants: Secondary | ICD-10-CM

## 2011-12-31 LAB — TSH: TSH: 2.63 u[IU]/mL (ref 0.35–5.50)

## 2011-12-31 LAB — BRAIN NATRIURETIC PEPTIDE: Pro B Natriuretic peptide (BNP): 104 pg/mL — ABNORMAL HIGH (ref 0.0–100.0)

## 2011-12-31 LAB — CBC WITH DIFFERENTIAL/PLATELET
Basophils Absolute: 0.1 10*3/uL (ref 0.0–0.1)
Basophils Relative: 0.8 % (ref 0.0–3.0)
Eosinophils Absolute: 0.2 10*3/uL (ref 0.0–0.7)
Eosinophils Relative: 2.6 % (ref 0.0–5.0)
HCT: 41.3 % (ref 36.0–46.0)
Hemoglobin: 13.9 g/dL (ref 12.0–15.0)
Lymphocytes Relative: 24.3 % (ref 12.0–46.0)
Lymphs Abs: 1.8 10*3/uL (ref 0.7–4.0)
MCHC: 33.6 g/dL (ref 30.0–36.0)
MCV: 93 fl (ref 78.0–100.0)
Monocytes Absolute: 0.5 10*3/uL (ref 0.1–1.0)
Monocytes Relative: 7.5 % (ref 3.0–12.0)
Neutro Abs: 4.7 10*3/uL (ref 1.4–7.7)
Neutrophils Relative %: 64.8 % (ref 43.0–77.0)
Platelets: 234 10*3/uL (ref 150.0–400.0)
RBC: 4.44 Mil/uL (ref 3.87–5.11)
RDW: 13.6 % (ref 11.5–14.6)
WBC: 7.3 10*3/uL (ref 4.5–10.5)

## 2011-12-31 LAB — BASIC METABOLIC PANEL
BUN: 12 mg/dL (ref 6–23)
CO2: 32 mEq/L (ref 19–32)
Calcium: 9.7 mg/dL (ref 8.4–10.5)
Chloride: 103 mEq/L (ref 96–112)
Creatinine, Ser: 0.8 mg/dL (ref 0.4–1.2)
GFR: 78.97 mL/min (ref >60.00)
Glucose, Bld: 99 mg/dL (ref 70–99)
Potassium: 4.6 mEq/L (ref 3.5–5.1)
Sodium: 142 mEq/L (ref 135–145)

## 2011-12-31 LAB — POCT INR: INR: 3.7

## 2011-12-31 MED ORDER — ENOXAPARIN SODIUM 100 MG/ML ~~LOC~~ SOLN: 100.0000 mg | Freq: Every day | SUBCUTANEOUS | Status: DC

## 2011-12-31 NOTE — Progress Notes (Signed)
[name] Date of Birth: 1935/05/12 Medical Record #[id number]  History of Present Illness: Catherine [name] is seen today for a work in visit. She is seen for Catherine [name]. She is a very pleasant 77 year old female who has a history of mitral stenosis. She had rheumatic fever. Had prior mitral commissurotomy in 1970. Last echo was in 2011. Her other problems include HTN, atrial fib and chronic anticoagulation.  She comes in today. She is here alone. She has not felt well for the past 2 weeks. She notes more shortness of breath, with and without exertion. Says she can't walk from room to room without getting short of breath. No chest pain. Feels fatigued. Has had some headaches. Has not been checking her blood pressure at home. She is due for colonoscopy next Friday. She has been having some lower abdominal pain. She has recently been started on aldactone due to her increased need for potassium replacement. She thinks this might be the problem.  Current Outpatient Prescriptions on File Prior to Visit  Medication Sig Dispense Refill  . Ascorbic Acid (VITAMIN C) 500 MG tablet Take 500 mg by mouth daily.        . diazepam (VALIUM) 5 MG tablet 1/2 tab daily at bedtime      . digoxin (LANOXIN) 0.25 MG tablet 1/2 tablet daily  90 tablet  3  . furosemide (LASIX) 80 MG tablet Take 0.5 tablets (40 mg total) by mouth 2 (two) times daily.  90 tablet  3  . Multiple Vitamin (MULTIVITAMIN) tablet Take 1 tablet by mouth daily.        . potassium chloride (K-DUR) 10 MEQ tablet 2 tablets  twice a day  360 tablet  11  . spironolactone (ALDACTONE) 25 MG tablet Take 1 tablet (25 mg total) by mouth daily.  30 tablet  6  . TENORMIN 25 MG tablet TAKE (1/2) TABLET DAILY.  90 each  3  . vitamin B-12 (CYANOCOBALAMIN) 250 MCG tablet Take 250 mcg by mouth daily.      Marland Kitchen warfarin (COUMADIN) 5 MG tablet Take as directed by the Anticoagulation Clinic.  90 tablet  3   Current Facility-Administered Medications on File Prior to  Visit  Medication Dose Route Frequency Provider Last Rate Last Dose  . pneumococcal 23 valent vaccine (PNU-IMMUNE) injection 0.5 mL  0.5 mL Intramuscular Once Kristian Covey, MD        Allergies  Allergen Reactions  . Ace Inhibitors   . Amoxicillin     tachy  . Ciprocin-Fluocin-Procin (Fluocinolone Acetonide)   . Codeine   . Diltiazem     ha  . Flagyl (Metronidazole Hcl)   . Flovent (Fluticasone Propionate)     Leg cramps  . Lotensin     cough  . Macrobid     nausea  . Quinidine     Fever diarrhea  . Verapamil     myalgias    Past Medical History  Diagnosis Date  . Hypertension   . Atrial fibrillation, chronic   . Heart murmur   . Arthritis   . UTI (urinary tract infection)   . Chronic kidney disease   . Valvular heart disease     Has mitral stenosis with prior mitral commissurotomy in 1970  . H/O: rheumatic fever   . Chronic anticoagulation     Past Surgical History  Procedure Date  . Cardiac catheterization   . Back surgery     neurosurgery  . Commissurotomy 1970  .  Other surgical history     hysterectomy  . Cataract surg     History  Smoking status  . Former Smoker  Smokeless tobacco  . Not on file    History  Alcohol Use No    Family History  Problem Relation Age of Onset  . Leukemia Father     Review of Systems: The review of systems is positive for dyspnea and fatigue.  All other systems were reviewed and are negative.  Physical Exam: BP 164/80  Pulse 68  Ht 5\' 4"  (1.626 m)  Wt 149 lb (67.586 kg)  BMI 25.58 kg/m2  SpO2 95% Patient is very pleasant and in no acute distress. She is very well dressed.  Skin is warm and dry. Color is normal.  HEENT is unremarkable. Normocephalic/atraumatic. PERRL. Sclera are nonicteric. Neck is supple. No masses. No JVD. Lungs are clear. Oxygen sat is normal at rest. Cardiac exam shows an irregular rhythm. Rate is controlled. Abdomen is soft. Extremities are without edema. Gait and ROM are intact. No  gross neurologic deficits noted.  LABORATORY DATA: PENDING   Assessment / Plan:

## 2011-12-31 NOTE — Assessment & Plan Note (Signed)
She has had rheumatic fever and has mitral stenosis with remote mitral valve commissurotomy in 1970. Now presenting with more dyspnea and fatigue. We are going to update her echo. We are checking labs today as well. I have not changed her medicines for now. Further disposition to follow. For now, will let her go on and proceed with her colonoscopy unless the echo findings suggest cancellation. Patient is agreeable to this plan and will call if any problems develop in the interim.

## 2011-12-31 NOTE — Patient Instructions (Signed)
We need to get an ultrasound of your heart and check some blood work today  Stay on your current medicines for now

## 2011-12-31 NOTE — Patient Instructions (Addendum)
Take last dosage of Coumadin on Saturday 01/01/12. No Coumadin, No Lovenox on 01/02/12. Start Lovenox injections 100mg  once daily in the early am on 01/03/12. Continue Lovenox injections once daily on 01/04/12, 01/05/12, and last dosage of Lovenox in early am on 01/06/12 prior to procedure on 01/07/12.  Resume Coumadin and Lovenox post procedure once GI MD advises safe to restart.  Restart Coumadin once GI MD has instructed safe to do so. Take 1 tablet x 2 days, then resume previous dosage of 1/2 tablet daily. Restart Lovenox 100mg  once daily in early am once GI MD advises it is safe to do so, and continue on once daily until recheck and INR is greater than 2.0.

## 2012-01-03 ENCOUNTER — Telehealth: Payer: Self-pay | Admitting: *Deleted

## 2012-01-03 ENCOUNTER — Other Ambulatory Visit: Payer: Self-pay

## 2012-01-03 ENCOUNTER — Ambulatory Visit (HOSPITAL_COMMUNITY): Payer: Medicare Other | Attending: Cardiology

## 2012-01-03 DIAGNOSIS — R0600 Dyspnea, unspecified: Secondary | ICD-10-CM

## 2012-01-03 DIAGNOSIS — R0602 Shortness of breath: Secondary | ICD-10-CM

## 2012-01-03 DIAGNOSIS — R0989 Other specified symptoms and signs involving the circulatory and respiratory systems: Secondary | ICD-10-CM | POA: Insufficient documentation

## 2012-01-03 DIAGNOSIS — I4891 Unspecified atrial fibrillation: Secondary | ICD-10-CM | POA: Insufficient documentation

## 2012-01-03 DIAGNOSIS — R5381 Other malaise: Secondary | ICD-10-CM | POA: Insufficient documentation

## 2012-01-03 DIAGNOSIS — R0609 Other forms of dyspnea: Secondary | ICD-10-CM | POA: Insufficient documentation

## 2012-01-03 DIAGNOSIS — I059 Rheumatic mitral valve disease, unspecified: Secondary | ICD-10-CM | POA: Insufficient documentation

## 2012-01-03 DIAGNOSIS — I00 Rheumatic fever without heart involvement: Secondary | ICD-10-CM | POA: Insufficient documentation

## 2012-01-03 DIAGNOSIS — R5383 Other fatigue: Secondary | ICD-10-CM

## 2012-01-03 DIAGNOSIS — I10 Essential (primary) hypertension: Secondary | ICD-10-CM | POA: Insufficient documentation

## 2012-01-03 NOTE — Telephone Encounter (Signed)
Message copied by Burnell Blanks on Mon Jan 03, 2012  6:03 PM ------      Message from: Rosalio Macadamia      Created: Fri Dec 31, 2011  2:57 PM       Ok to report. Labs are satisfactory. Will see what the echo shows.

## 2012-01-03 NOTE — Telephone Encounter (Signed)
She could stop the Aldactone and go back to her prior dose of potassium

## 2012-01-03 NOTE — Telephone Encounter (Signed)
Advised of labs  1)Patient symptoms started after starting Aldactone and she is concerned coming from this medication (read information from pharmacy)  2)would like to go ahead with colonoscopy on Friday, if she doesn't get stress test and or PFT's will it be ok to go ahead with procedure  Will forward to [facility] NP to discuss tomorrow

## 2012-01-04 NOTE — Telephone Encounter (Signed)
Patient to call back next week with an update and let us know how she is feeling.  Will determine then if she will have PFT's and stress test

## 2012-01-07 ENCOUNTER — Other Ambulatory Visit: Payer: Self-pay | Admitting: Gastroenterology

## 2012-01-07 ENCOUNTER — Encounter: Payer: Self-pay | Admitting: Cardiology

## 2012-01-07 LAB — HM COLONOSCOPY: HM Colonoscopy: 1

## 2012-01-11 ENCOUNTER — Telehealth: Payer: Self-pay | Admitting: *Deleted

## 2012-01-11 NOTE — Telephone Encounter (Signed)
Since she is feeling better we will hold off on her pulmonary function studies and on her stress test for now

## 2012-01-11 NOTE — Telephone Encounter (Signed)
Advised patient, will call back if starts having any problems

## 2012-01-11 NOTE — Telephone Encounter (Signed)
Patient phoned to give update on how she was feeling since stopping Aldactone.  Patient states her energy level is better and that she felt better after leaving off the first dose.  States she does have shortness of breath, some days worse than others.  She has had this all along.  Advised patient would discuss with  Dr. Patty Sermons and he would advise on continuing on  with PFT's and stress test

## 2012-01-12 ENCOUNTER — Ambulatory Visit (INDEPENDENT_AMBULATORY_CARE_PROVIDER_SITE_OTHER): Payer: Medicare Other | Admitting: *Deleted

## 2012-01-12 DIAGNOSIS — Z7901 Long term (current) use of anticoagulants: Secondary | ICD-10-CM

## 2012-01-12 DIAGNOSIS — I4891 Unspecified atrial fibrillation: Secondary | ICD-10-CM

## 2012-01-14 ENCOUNTER — Encounter: Payer: Self-pay | Admitting: Family Medicine

## 2012-01-14 ENCOUNTER — Ambulatory Visit (INDEPENDENT_AMBULATORY_CARE_PROVIDER_SITE_OTHER): Payer: Medicare Other | Admitting: Family Medicine

## 2012-01-14 VITALS — BP 140/88 | Temp 98.3°F | Wt 148.0 lb

## 2012-01-14 DIAGNOSIS — E538 Deficiency of other specified B group vitamins: Secondary | ICD-10-CM

## 2012-01-14 DIAGNOSIS — G629 Polyneuropathy, unspecified: Secondary | ICD-10-CM

## 2012-01-14 DIAGNOSIS — G589 Mononeuropathy, unspecified: Secondary | ICD-10-CM

## 2012-01-14 DIAGNOSIS — T148XXA Other injury of unspecified body region, initial encounter: Secondary | ICD-10-CM

## 2012-01-14 NOTE — Progress Notes (Signed)
Subjective:    Patient ID: Catherine [name], female    DOB: [birth year], 77 y.o.   MRN: [id number]  HPI  Right foot pain. Noted about one week ago. Noted some bruising. Denies injury. On Coumadin. Recent colonoscopy and transition to Lovenox and is now back on Coumadin. INR 1.62 days ago. She has no difficulty with ambulation. She has firm center near her area of ecchymosis and was concerned because of that.  She also has history of neuropathy-type pains feet. Has been tried by neurosurgeon Lyrica and gabapentin and had severe edema of both and off both. Her symptoms are mostly bottoms of feet. Recent TSH normal. Burning sensation in both feet. No current back pain. No history of anemia. No prior B12 level.  No diabetes history.  Past Medical History  Diagnosis Date  . Hypertension   . Atrial fibrillation, chronic   . Heart murmur   . Arthritis   . UTI (urinary tract infection)   . Chronic kidney disease   . Valvular heart disease     Has mitral stenosis with prior mitral commissurotomy in 1970  . H/O: rheumatic fever   . Chronic anticoagulation    Past Surgical History  Procedure Date  . Cardiac catheterization   . Back surgery     neurosurgery  . Commissurotomy 1970  . Other surgical history     hysterectomy  . Cataract surg     reports that she has quit smoking. She does not have any smokeless tobacco history on file. She reports that she does not drink alcohol or use illicit drugs. family history includes Leukemia in her father. Allergies  Allergen Reactions  . Ace Inhibitors   . Amoxicillin     tachy  . Ciprocin-Fluocin-Procin (Fluocinolone Acetonide)   . Codeine   . Diltiazem     ha  . Flagyl (Metronidazole Hcl)   . Flovent (Fluticasone Propionate)     Leg cramps  . Lotensin     cough  . Macrobid     nausea  . Quinidine     Fever diarrhea  . Verapamil     myalgias      Review of Systems  Constitutional: Negative for fever, chills, appetite change,  fatigue and unexpected weight change.  Eyes: Negative for visual disturbance.  Respiratory: Negative for cough and shortness of breath.   Cardiovascular: Negative for chest pain.  Musculoskeletal: Negative for gait problem.  Neurological: Positive for numbness. Negative for dizziness, syncope and weakness.  Hematological: Negative for adenopathy. Does not bruise/bleed easily.       Objective:   Physical Exam  Constitutional: She appears well-developed and well-nourished.  HENT:  Mouth/Throat: Oropharynx is clear and moist.  Neck: Neck supple. No thyromegaly present.  Cardiovascular: Normal rate and regular rhythm.   Pulmonary/Chest: Effort normal and breath sounds normal. No respiratory distress. She has no wheezes. She has no rales.  Musculoskeletal: She exhibits no edema.       Patient has small hematoma about 1 cm right medial ankle. She has area of ecchymosis surrounding about 10 cm around this. Minimally tender. Full range of motion ankle. Minimal surrounding soft tissue edema. No erythema or warmth.  Lymphadenopathy:    She has no cervical adenopathy.  Neurological: She has normal reflexes.          Assessment & Plan:  #1 small hematoma right medial ankle. No history of injury. Recent INR 1.6. Reassurance. Handout on hematomas given. She is aware this may take several months  to resolve but should not pose any problems #2 history of chronic bilateral peripheral neuropathy pains. Check B12 level. Recent TSH normal. Discussed other potential options such as Cymbalta and at this point she wishes to wait.

## 2012-01-14 NOTE — Patient Instructions (Signed)
Hematoma   A hematoma is a pocket of blood that collects under the skin, in an organ, in a body space, in a joint space, or in other tissue. The blood can clot to form a lump that you can see and feel. The lump is often firm, sore, and sometimes even painful and tender. Most hematomas get better in a few days to weeks. However, some hematomas may be serious and require medical care. Hematomas can range in size from very small to very large.   CAUSES   A hematoma can be caused by a blunt or penetrating injury. It can also be caused by leakage from a blood vessel under the skin. Spontaneous leakage from a blood vessel is more likely to occur in elderly people, especially those taking blood thinners. Sometimes, a hematoma can develop after certain medical procedures.   SYMPTOMS   Unlike a bruise, a hematoma forms a firm lump that you can feel. This lump is the collection of blood. The collection of blood can also cause your skin to turn a blue to dark blue color. If the hematoma is close to the surface of the skin, it often produces a yellowish color in the skin.   DIAGNOSIS   Your caregiver can determine whether you have a hematoma based on your history and a physical exam.   TREATMENT   Hematomas usually go away on their own over time. Rarely does the blood need to be drained out of the body.   HOME CARE INSTRUCTIONS   Put ice on the injured area.   Put ice in a plastic bag.   Place a towel between your skin and the bag.   Leave the ice on for 15 to 20 minutes, 3 to 4 times a day for the first 1 to 2 days.   After the first 2 days, switch to using warm compresses on the hematoma.   Elevate the injured area to help decrease pain and swelling. Wrapping the area with an elastic bandage may also be helpful. Compression helps to reduce swelling and promotes shrinking of the hematoma. Make sure the bandage is not wrapped too tight.   If your hematoma is on a lower extremity and is painful, crutches may be helpful for a  couple days.   Only take over-the-counter or prescription medicines for pain, discomfort, or fever as directed by your caregiver. Most patients can take acetaminophen or ibuprofen for the pain.   SEEK IMMEDIATE MEDICAL CARE IF:   You have increasing pain, or your pain is not controlled with medicine.   You have a fever.   You have worsening swelling or discoloration.   Your skin over the hematoma breaks or starts bleeding.   MAKE SURE YOU:   Understand these instructions.   Will watch your condition.   Will get help right away if you are not doing well or get worse.   Document Released: 04/27/2004 Document Revised: 09/02/2011 Document Reviewed: 05/17/2011   [employer]® Patient Information ©2012 [employer].

## 2012-01-18 NOTE — Progress Notes (Signed)
Quick Note:  Pt informed ______ 

## 2012-01-19 ENCOUNTER — Ambulatory Visit (INDEPENDENT_AMBULATORY_CARE_PROVIDER_SITE_OTHER): Payer: Medicare Other | Admitting: *Deleted

## 2012-01-19 DIAGNOSIS — I4891 Unspecified atrial fibrillation: Secondary | ICD-10-CM

## 2012-01-19 DIAGNOSIS — Z7901 Long term (current) use of anticoagulants: Secondary | ICD-10-CM

## 2012-01-20 ENCOUNTER — Other Ambulatory Visit: Payer: Self-pay | Admitting: Cardiology

## 2012-01-20 ENCOUNTER — Telehealth: Payer: Self-pay | Admitting: *Deleted

## 2012-01-20 ENCOUNTER — Other Ambulatory Visit: Payer: Self-pay | Admitting: *Deleted

## 2012-01-20 NOTE — Telephone Encounter (Signed)
Patient in yesterday for INR.  She stated since she stopped her Aldactone feeling much better.  She did start back on her Maxzide, ok per  Dr. Patty Sermons

## 2012-01-25 ENCOUNTER — Other Ambulatory Visit: Payer: Self-pay | Admitting: Cardiology

## 2012-01-25 ENCOUNTER — Ambulatory Visit (INDEPENDENT_AMBULATORY_CARE_PROVIDER_SITE_OTHER): Payer: Medicare Other | Admitting: Family Medicine

## 2012-01-25 ENCOUNTER — Encounter: Payer: Self-pay | Admitting: Family Medicine

## 2012-01-25 VITALS — BP 120/80 | Temp 98.3°F | Wt 148.0 lb

## 2012-01-25 DIAGNOSIS — J029 Acute pharyngitis, unspecified: Secondary | ICD-10-CM

## 2012-01-25 DIAGNOSIS — R300 Dysuria: Secondary | ICD-10-CM

## 2012-01-25 DIAGNOSIS — J069 Acute upper respiratory infection, unspecified: Secondary | ICD-10-CM

## 2012-01-25 LAB — POCT URINALYSIS DIPSTICK
Bilirubin, UA: NEGATIVE
Glucose, UA: NEGATIVE
Ketones, UA: NEGATIVE

## 2012-01-25 MED ORDER — LEVOFLOXACIN 500 MG PO TABS: 500.0000 mg | ORAL_TABLET | Freq: Every day | ORAL | Status: AC

## 2012-01-25 NOTE — Patient Instructions (Signed)

## 2012-01-25 NOTE — Progress Notes (Signed)
  Subjective:    Patient ID: Catherine [name], female    DOB: [birth year], 77 y.o.   MRN: [id number]  HPI  Patient seen with sore throat for 3 day history. Minimal nasal congestion. Occasional dry cough. No fever. Occasional bloody sinus discharge. No puslike secretions. Used husband's hydrocodone cough syrup with some relief of cough.  Four-day history of foul odor with urine and increased burning. Denies any nausea, vomiting, or back pain. Does have some suprapubic discomfort. She has atrial fibrillation on chronic Coumadin. Recent INR 2.6. Multiple antibiotic allergies which are reviewed. She has taken Levaquin in the past.  Past Medical History  Diagnosis Date  . Hypertension   . Atrial fibrillation, chronic   . Heart murmur   . Arthritis   . UTI (urinary tract infection)   . Chronic kidney disease   . Valvular heart disease     Has mitral stenosis with prior mitral commissurotomy in 1970  . H/O: rheumatic fever   . Chronic anticoagulation    Past Surgical History  Procedure Date  . Cardiac catheterization   . Back surgery     neurosurgery  . Commissurotomy 1970  . Other surgical history     hysterectomy  . Cataract surg     reports that she has quit smoking. She does not have any smokeless tobacco history on file. She reports that she does not drink alcohol or use illicit drugs. family history includes Leukemia in her father. Allergies  Allergen Reactions  . Ace Inhibitors   . Aldactone (Spironolactone)     dyspnea  . Amoxicillin     tachy  . Benazepril Hcl     cough  . Ciprocin-Fluocin-Procin (Fluocinolone Acetonide)   . Codeine   . Diltiazem     ha  . Flagyl (Metronidazole Hcl)   . Flovent (Fluticasone Propionate)     Leg cramps  . Nitrofurantoin Monohyd Macro     nausea  . Quinidine     Fever diarrhea  . Verapamil     myalgias      Review of Systems  Constitutional: Negative for fever and chills.  HENT: Positive for congestion, sore throat and sinus  pressure. Negative for trouble swallowing.   Respiratory: Positive for cough. Negative for shortness of breath and wheezing.   Cardiovascular: Negative for chest pain.  Gastrointestinal: Negative for abdominal pain.  Genitourinary: Positive for dysuria. Negative for hematuria.  Neurological: Negative for headaches.       Objective:   Physical Exam  Constitutional: She appears well-developed and well-nourished.  HENT:  Right Ear: External ear normal.  Left Ear: External ear normal.  Mouth/Throat: Oropharynx is clear and moist.  Neck: Neck supple.  Cardiovascular: Normal rate.   Pulmonary/Chest: Effort normal and breath sounds normal. No respiratory distress. She has no wheezes. She has no rales.  Musculoskeletal: She exhibits no edema.  Lymphadenopathy:    She has no cervical adenopathy.          Assessment & Plan:  #1 dysuria. Probable UTI. Urine culture sent. Levaquin 500 mg once daily for 7 days. She has been intolerant of multiple other antibiotics previously. #2 sore throat. Suspect viral versus allergic postnasal drip. Will try over-the-counter plain antihistamine such as Allegra. Hycodan cough syrup as needed

## 2012-01-27 LAB — URINE CULTURE: Colony Count: >=100000

## 2012-01-28 ENCOUNTER — Ambulatory Visit (INDEPENDENT_AMBULATORY_CARE_PROVIDER_SITE_OTHER): Payer: Medicare Other | Admitting: *Deleted

## 2012-01-28 ENCOUNTER — Telehealth: Payer: Self-pay | Admitting: *Deleted

## 2012-01-28 DIAGNOSIS — I4891 Unspecified atrial fibrillation: Secondary | ICD-10-CM

## 2012-01-28 DIAGNOSIS — Z7901 Long term (current) use of anticoagulants: Secondary | ICD-10-CM

## 2012-01-28 DIAGNOSIS — E7800 Pure hypercholesterolemia, unspecified: Secondary | ICD-10-CM

## 2012-01-28 LAB — BASIC METABOLIC PANEL
BUN: 14 mg/dL (ref 6–23)
CO2: 32 mEq/L (ref 19–32)
Calcium: 9.5 mg/dL (ref 8.4–10.5)
Creatinine, Ser: 0.8 mg/dL (ref 0.4–1.2)
GFR: 80.17 mL/min (ref >60.00)
Glucose, Bld: 104 mg/dL — ABNORMAL HIGH (ref 70–99)

## 2012-01-28 LAB — HEPATIC FUNCTION PANEL
ALT: 37 U/L — ABNORMAL HIGH (ref 0–35)
AST: 41 U/L — ABNORMAL HIGH (ref 0–37)
Alkaline Phosphatase: 74 U/L (ref 39–117)
Bilirubin, Direct: 0.1 mg/dL (ref 0.0–0.3)
Total Bilirubin: 0.8 mg/dL (ref 0.3–1.2)

## 2012-01-28 LAB — LIPID PANEL: Triglycerides: 206 mg/dL — ABNORMAL HIGH (ref 0.0–149.0)

## 2012-01-28 NOTE — Telephone Encounter (Signed)
Labs reviewed by Lawson Fiscal NP and potassium low.  Will increase K+ from 3 tid to 4 tid, advised patient

## 2012-02-01 NOTE — Progress Notes (Signed)
Quick Note:  Pt husband informed ______ 

## 2012-02-09 ENCOUNTER — Other Ambulatory Visit: Payer: Medicare Other

## 2012-02-09 ENCOUNTER — Ambulatory Visit: Payer: Medicare Other | Admitting: Cardiology

## 2012-02-25 ENCOUNTER — Encounter: Payer: Self-pay | Admitting: Cardiology

## 2012-02-25 ENCOUNTER — Ambulatory Visit (INDEPENDENT_AMBULATORY_CARE_PROVIDER_SITE_OTHER): Payer: Medicare Other | Admitting: *Deleted

## 2012-02-25 ENCOUNTER — Ambulatory Visit (INDEPENDENT_AMBULATORY_CARE_PROVIDER_SITE_OTHER): Payer: Medicare Other | Admitting: Cardiology

## 2012-02-25 VITALS — BP 140/80 | HR 76 | Resp 18 | Ht 64.0 in | Wt 145.0 lb

## 2012-02-25 DIAGNOSIS — I4891 Unspecified atrial fibrillation: Secondary | ICD-10-CM

## 2012-02-25 DIAGNOSIS — R7989 Other specified abnormal findings of blood chemistry: Secondary | ICD-10-CM

## 2012-02-25 DIAGNOSIS — Z79899 Other long term (current) drug therapy: Secondary | ICD-10-CM

## 2012-02-25 DIAGNOSIS — I050 Rheumatic mitral stenosis: Secondary | ICD-10-CM

## 2012-02-25 DIAGNOSIS — E7800 Pure hypercholesterolemia, unspecified: Secondary | ICD-10-CM

## 2012-02-25 DIAGNOSIS — Z7901 Long term (current) use of anticoagulants: Secondary | ICD-10-CM

## 2012-02-25 LAB — BASIC METABOLIC PANEL
BUN: 15 mg/dL (ref 6–23)
CO2: 32 mEq/L (ref 19–32)
Calcium: 9.3 mg/dL (ref 8.4–10.5)
GFR: 84.02 mL/min (ref >60.00)
Glucose, Bld: 98 mg/dL (ref 70–99)

## 2012-02-25 LAB — HEPATIC FUNCTION PANEL
Albumin: 4.4 g/dL (ref 3.5–5.2)
Total Protein: 7 g/dL (ref 6.0–8.3)

## 2012-02-25 NOTE — Progress Notes (Signed)
Catherine [name] Date of Birth:  1935/03/10 [facility] [address] [place], [place]  [address] [phone]         Fax   [phone]  History of Present Illness: This pleasant 77 year old woman is seen for a scheduled followup office visit.  She has a past history of rheumatic fever and rheumatic heart disease.  She has a history of mitral stenosis.  In 1970s she underwent mitral commissurotomy by Dr. Micah Noel her last echocardiogram was in 2011 and showed a normal ejection fraction of 55-60% biatrial enlargement, mild aortic stenosis, mild mitral stenosis with moderate to severe mitral regurgitation, and mild tricuspid regurgitation with normal pulmonary artery pressure.  Her mitral valve area was 1.9  Current Outpatient Prescriptions  Medication Sig Dispense Refill  . Ascorbic Acid (VITAMIN C) 500 MG tablet Take 500 mg by mouth daily.        . diazepam (VALIUM) 5 MG tablet 1/2 tab daily at bedtime      . digoxin (LANOXIN) 0.25 MG tablet 1/2 tablet daily  90 tablet  3  . furosemide (LASIX) 80 MG tablet Take 0.5 tablets (40 mg total) by mouth 2 (two) times daily.  90 tablet  3  . MAXZIDE-25 37.5-25 MG per tablet TAKE 1 TABLET DAILY  90 each  3  . Multiple Vitamin (MULTIVITAMIN) tablet Take 1 tablet by mouth daily.        . potassium chloride (K-DUR) 10 MEQ tablet 2 tablets three times a day      . TENORMIN 25 MG tablet TAKE (1/2) TABLET DAILY.  90 each  3  . vitamin B-12 (CYANOCOBALAMIN) 250 MCG tablet Take 250 mcg by mouth daily.      Marland Kitchen warfarin (COUMADIN) 5 MG tablet Take as directed by the Anticoagulation Clinic.  90 tablet  3  . amoxicillin-clavulanate (AUGMENTIN) 875-125 MG per tablet       . enoxaparin (LOVENOX) 100 MG/ML injection Inject 1 mL (100 mg total) into the skin daily.  10 Syringe  1  . polyethylene glycol-electrolytes (NULYTELY/GOLYTELY) 420 G solution       . spironolactone (ALDACTONE) 25 MG tablet        Current Facility-Administered  Medications  Medication Dose Route Frequency Provider Last Rate Last Dose  . pneumococcal 23 valent vaccine (PNU-IMMUNE) injection 0.5 mL  0.5 mL Intramuscular Once Kristian Covey, MD        Allergies  Allergen Reactions  . Ace Inhibitors   . Aldactone (Spironolactone)     dyspnea  . Amoxicillin     tachy  . Benazepril Hcl     cough  . Ciprocin-Fluocin-Procin (Fluocinolone Acetonide)   . Codeine   . Diltiazem     ha  . Flagyl (Metronidazole Hcl)   . Flovent (Fluticasone Propionate)     Leg cramps  . Nitrofurantoin Monohyd Macro     nausea  . Quinidine     Fever diarrhea  . Verapamil     myalgias    Patient Active Problem List  Diagnoses  . RHEUMATIC MITRAL STENOSIS  . RHEUMATIC HEART DISEASE  . VALVULAR HEART DISEASE  . Atrial Fibrillation  . COPD  . DYSPNEA  . COUGH  . Atrial fibrillation  . Low back pain  . Benign hypertensive heart disease without heart failure  . Hypercholesterolemia  . Encounter for long-term (current) use of anticoagulants    History  Smoking status  . Former Smoker  Smokeless tobacco  . Not on file  History  Alcohol Use No    Family History  Problem Relation Age of Onset  . Leukemia Father     Review of Systems: Constitutional: no fever chills diaphoresis or fatigue or change in weight.  Head and neck: no hearing loss, no epistaxis, no photophobia or visual disturbance. Respiratory: No cough, shortness of breath or wheezing. Cardiovascular: No chest pain peripheral edema, palpitations. Gastrointestinal: No abdominal distention, no abdominal pain, no change in bowel habits hematochezia or melena. Genitourinary: No dysuria, no frequency, no urgency, no nocturia. Musculoskeletal:No arthralgias, no back pain, no gait disturbance or myalgias. Neurological: No dizziness, no headaches, no numbness, no seizures, no syncope, no weakness, no tremors. Hematologic: No lymphadenopathy, no easy bruising. Psychiatric: No confusion,  no hallucinations, no sleep disturbance.    Physical Exam: Filed Vitals:   02/25/12 1358  BP: 140/80  Pulse: 76  Resp: 18   the general appearance reveals a healthy-appearing elderly woman in no distress.Pupils equal and reactive.   Extraocular Movements are full.  There is no scleral icterus.  The mouth and pharynx are normal.  The neck is supple.  The carotids reveal no bruits.  The jugular venous pressure is normal.  The thyroid is not enlarged.  There is no lymphadenopathy.  The chest is clear to percussion and auscultation. There are no rales or rhonchi. Expansion of the chest is symmetrical.  Heart reveals a grade 2/6 systolic murmur of mitral regurgitation the apex and a soft diastolic rumble apex The abdomen is soft and nontender. Bowel sounds are normal. The liver and spleen are not enlarged. There Are no abdominal masses. There are no bruits.  The pedal pulses are good.  There is no phlebitis or edema.  There is no cyanosis or clubbing. Strength is normal and symmetrical in all extremities.  There is no lateralizing weakness.  There are no sensory deficits.     Assessment / Plan: Continue same medication.  We're checking electrolytes and liver function studies today.  Check in 4 months for followup office visit and fasting lab work.  She will be getting her prothrombin times done at Dr. Roswell Miners office.  Her target for her INR is between 2.0 and 3 point

## 2012-02-25 NOTE — Assessment & Plan Note (Signed)
The patient has a past history of hypercholesterolemia.  She prefers not to go on statin therapy.  She will make a better effort to decrease fatty food such as cheese and ice cream.  Plan to recheck her lipids at her next visit in 4 months

## 2012-02-25 NOTE — Assessment & Plan Note (Signed)
Patient has a history of chronic atrial fibrillation related to her mitral valve disease.  She is on long-term Coumadin.  She will be getting her prothrombin times followed at Dr. Lucie Leather office going forward because of proximity to her home.  She has not been having any TIA symptoms

## 2012-02-25 NOTE — Patient Instructions (Signed)
Will obtain labs today and call you with the results   Decrease your ice cream and cheese intake  Your physician recommends that you continue on your current medications as directed. Please refer to the Current Medication list given to you today.  Your physician wants you to follow-up in: 4 month You will receive a reminder letter in the mail two months in advance. If you don't receive a letter, please call our office to schedule the follow-up appointment.

## 2012-02-25 NOTE — Assessment & Plan Note (Signed)
Since last visit the patient has been doing well from the cardiac standpoint.  She's not been having any increased shortness of breath.  No chest pain.

## 2012-02-29 ENCOUNTER — Ambulatory Visit: Payer: Self-pay | Admitting: Cardiovascular Disease

## 2012-02-29 ENCOUNTER — Telehealth: Payer: Self-pay | Admitting: *Deleted

## 2012-02-29 ENCOUNTER — Telehealth: Payer: Self-pay | Admitting: Family Medicine

## 2012-02-29 ENCOUNTER — Other Ambulatory Visit: Payer: Self-pay | Admitting: *Deleted

## 2012-02-29 DIAGNOSIS — I4891 Unspecified atrial fibrillation: Secondary | ICD-10-CM

## 2012-02-29 DIAGNOSIS — Z7901 Long term (current) use of anticoagulants: Secondary | ICD-10-CM

## 2012-02-29 MED ORDER — TRIAMTERENE-HCTZ 37.5-25 MG PO TABS: ORAL_TABLET | ORAL | Status: DC

## 2012-02-29 NOTE — Telephone Encounter (Signed)
Pt had coumadin check at Dr Elmo Putt office last Friday, 5/31 and it was 3.3.  He wanted her to have it checked again in 3 weeks.  She asked if she could have her coumadin managed at Dr Lucie Leather office and he said OK if they will do that.  I told her OK, we can do that.    We do need to schedule her here, so please advise and send back to me for our schedulers.

## 2012-02-29 NOTE — Telephone Encounter (Signed)
Please call pt to schedule her with our coumadin clinic, thank you

## 2012-02-29 NOTE — Telephone Encounter (Signed)
Called pt and schd pts Coumadin for [facility] office as noted. Pt coming in on 03/17/12 at 1:20 pm as noted.

## 2012-02-29 NOTE — Telephone Encounter (Signed)
Message copied by Antony Odea on Tue Feb 29, 2012  2:04 PM ------      Message from: Cassell Clement      Created: Sun Feb 27, 2012  4:03 PM       liver tests are normal.  Potassium is still too low. Continue same K pills 8/day but decrease maxzide to one half tablet daily to decrease K losses. Eat high K foods.

## 2012-02-29 NOTE — Telephone Encounter (Signed)
Pt called with results and will reduce med to 1/2 tablet daily, informed coumadin clinic they will not be seeing her/ pcp burchette will be continuing coumadin care.

## 2012-02-29 NOTE — Telephone Encounter (Signed)
Opened in Error.

## 2012-02-29 NOTE — Telephone Encounter (Signed)
Okay to schedule here

## 2012-02-29 NOTE — Telephone Encounter (Signed)
Pt called and is req Harriett Sine to call pt back re: pts coumadin. Pls call.

## 2012-03-01 ENCOUNTER — Other Ambulatory Visit: Payer: Self-pay | Admitting: *Deleted

## 2012-03-01 DIAGNOSIS — E876 Hypokalemia: Secondary | ICD-10-CM

## 2012-03-01 MED ORDER — POTASSIUM CHLORIDE ER 10 MEQ PO TBCR: EXTENDED_RELEASE_TABLET | ORAL | Status: DC

## 2012-03-02 ENCOUNTER — Other Ambulatory Visit: Payer: Self-pay | Admitting: Family Medicine

## 2012-03-02 ENCOUNTER — Other Ambulatory Visit: Payer: Self-pay | Admitting: Cardiology

## 2012-03-02 NOTE — Telephone Encounter (Signed)
Refill for 6 months. 

## 2012-03-02 NOTE — Telephone Encounter (Signed)
Valium  Refill request, sig: 1/2 tab at HS prn, last filled #30 with 5 refills on 02/09/11

## 2012-03-17 ENCOUNTER — Ambulatory Visit (INDEPENDENT_AMBULATORY_CARE_PROVIDER_SITE_OTHER): Payer: Medicare Other | Admitting: Family

## 2012-03-17 DIAGNOSIS — I4891 Unspecified atrial fibrillation: Secondary | ICD-10-CM

## 2012-03-17 DIAGNOSIS — Z7901 Long term (current) use of anticoagulants: Secondary | ICD-10-CM

## 2012-03-17 NOTE — Patient Instructions (Addendum)
Increase to 2.5mg  (1/2 tab) everyday. Recheck in 3 weeks.     Latest dosing instructions   Total Sun Mon Tue Wed Thu Fri Sat   17.5 2.5 mg 2.5 mg 2.5 mg 2.5 mg 2.5 mg 2.5 mg 2.5 mg    (5 mg0.5) (5 mg0.5) (5 mg0.5) (5 mg0.5) (5 mg0.5) (5 mg0.5) (5 mg0.5)

## 2012-04-06 ENCOUNTER — Ambulatory Visit (INDEPENDENT_AMBULATORY_CARE_PROVIDER_SITE_OTHER): Payer: Medicare Other | Admitting: Family

## 2012-04-06 DIAGNOSIS — E785 Hyperlipidemia, unspecified: Secondary | ICD-10-CM

## 2012-04-06 DIAGNOSIS — I4891 Unspecified atrial fibrillation: Secondary | ICD-10-CM

## 2012-04-06 DIAGNOSIS — Z7901 Long term (current) use of anticoagulants: Secondary | ICD-10-CM

## 2012-04-06 LAB — BASIC METABOLIC PANEL
BUN: 12 mg/dL (ref 6–23)
CO2: 31 mEq/L (ref 19–32)
Calcium: 9.6 mg/dL (ref 8.4–10.5)
Chloride: 100 mEq/L (ref 96–112)
Creatinine, Ser: 0.8 mg/dL (ref 0.4–1.2)

## 2012-04-06 NOTE — Patient Instructions (Addendum)
Today only take one whole tablet. Then continue Increase to 2.5mg  (1/2 tab) everyday. Recheck in 4 weeks.    Latest dosing instructions   Total Sun Mon Tue Wed Thu Fri Sat   17.5 2.5 mg 2.5 mg 2.5 mg 2.5 mg 2.5 mg 2.5 mg 2.5 mg    (5 mg0.5) (5 mg0.5) (5 mg0.5) (5 mg0.5) (5 mg0.5) (5 mg0.5) (5 mg0.5)

## 2012-04-07 ENCOUNTER — Encounter: Payer: Medicare Other | Admitting: Family

## 2012-04-17 ENCOUNTER — Ambulatory Visit (INDEPENDENT_AMBULATORY_CARE_PROVIDER_SITE_OTHER): Payer: Medicare Other | Admitting: Family Medicine

## 2012-04-17 ENCOUNTER — Ambulatory Visit (INDEPENDENT_AMBULATORY_CARE_PROVIDER_SITE_OTHER)
Admission: RE | Admit: 2012-04-17 | Discharge: 2012-04-17 | Disposition: A | Discharge status: Home / Self Care | Payer: Medicare Other | Source: Ambulatory Visit | Attending: Family Medicine | Admitting: Family Medicine

## 2012-04-17 ENCOUNTER — Encounter: Payer: Self-pay | Admitting: Family Medicine

## 2012-04-17 VITALS — BP 150/80 | Temp 98.7°F | Wt 142.0 lb

## 2012-04-17 DIAGNOSIS — M25562 Pain in left knee: Secondary | ICD-10-CM

## 2012-04-17 DIAGNOSIS — M25569 Pain in unspecified knee: Secondary | ICD-10-CM

## 2012-04-17 DIAGNOSIS — M79675 Pain in left toe(s): Secondary | ICD-10-CM

## 2012-04-17 DIAGNOSIS — M79609 Pain in unspecified limb: Secondary | ICD-10-CM

## 2012-04-17 NOTE — Progress Notes (Signed)
  Subjective:    Patient ID: Catherine [name], female    DOB: [birth year], 77 y.o.   MRN: [id number]  HPI  Left knee pain. location medial. Duration is 2-3 months. No known injury. No significant swelling. Pain with ambulation more than rest. No erythema or warmth. No locking or giving way.  No alleviating factors.    Right fifth toe pain. Injured 10 days ago. Hit on barstool. Pain with ambulation. Moderate swelling. She's use some icing with minimal relief. Takes Tylenol for discomfort. She is on Coumadin and avoids nonsteroidals.  Past Medical History  Diagnosis Date  . Hypertension   . Atrial fibrillation, chronic   . Heart murmur   . Arthritis   . UTI (urinary tract infection)   . Chronic kidney disease   . Valvular heart disease     Has mitral stenosis with prior mitral commissurotomy in 1970  . H/O: rheumatic fever   . Chronic anticoagulation    Past Surgical History  Procedure Date  . Cardiac catheterization   . Back surgery     neurosurgery  . Commissurotomy 1970  . Other surgical history     hysterectomy  . Cataract surg     reports that she has quit smoking. She does not have any smokeless tobacco history on file. She reports that she does not drink alcohol or use illicit drugs. family history includes Leukemia in her father. Allergies  Allergen Reactions  . Ace Inhibitors   . Aldactone (Spironolactone)     dyspnea  . Amoxicillin     tachy  . Benazepril Hcl     cough  . Ciprocin-Fluocin-Procin (Fluocinolone Acetonide)   . Codeine   . Diltiazem     ha  . Flagyl (Metronidazole Hcl)   . Flovent (Fluticasone Propionate)     Leg cramps  . Nitrofurantoin Monohyd Macro     nausea  . Quinidine     Fever diarrhea  . Verapamil     myalgias      Review of Systems  Respiratory: Negative for shortness of breath.   Cardiovascular: Negative for chest pain.  Musculoskeletal: Positive for arthralgias. Negative for myalgias, back pain, joint swelling and gait  problem.  Skin: Negative for rash.  Neurological: Negative for weakness and numbness.  Hematological: Negative for adenopathy.       Objective:   Physical Exam  Constitutional: She appears well-developed and well-nourished. No distress.  Cardiovascular: Normal rate.   Pulmonary/Chest: Effort normal and breath sounds normal. No respiratory distress. She has no wheezes. She has no rales.  Musculoskeletal:        Right fifth toe reveals edema. No ecchymosis. Mild tenderness to palpation. No foot pain otherwise  Left knee reals no effusion. Medial joint line tenderness. No warmth or erythema. Full range of motion. Ligament testing is normal.          Assessment & Plan:   #1 right fifth toe pain. Suspect fracture. Offered x-ray but this would not change management. Comfortable shoes and followup as needed #2 left knee pain. Question medial meniscal injury. No effusion. Given duration of 6 weeks start with plain x-rays.  May need MRI to further assess.

## 2012-04-25 ENCOUNTER — Telehealth: Payer: Self-pay | Admitting: *Deleted

## 2012-04-25 DIAGNOSIS — M25562 Pain in left knee: Secondary | ICD-10-CM

## 2012-04-25 NOTE — Telephone Encounter (Signed)
VM from pt reporting her left knee is still hurting really bad.   Requested a call back re: possible MRI

## 2012-04-25 NOTE — Telephone Encounter (Signed)
I agree.  I think we should go ahead with MRI and I will schedule.

## 2012-04-28 ENCOUNTER — Ambulatory Visit
Admission: RE | Admit: 2012-04-28 | Discharge: 2012-04-28 | Disposition: A | Discharge status: Home / Self Care | Payer: Medicare Other | Source: Ambulatory Visit | Attending: Family Medicine | Admitting: Family Medicine

## 2012-04-28 DIAGNOSIS — M25562 Pain in left knee: Secondary | ICD-10-CM

## 2012-04-30 ENCOUNTER — Other Ambulatory Visit: Payer: Medicare Other

## 2012-05-02 ENCOUNTER — Ambulatory Visit (INDEPENDENT_AMBULATORY_CARE_PROVIDER_SITE_OTHER): Payer: Medicare Other | Admitting: Family

## 2012-05-02 DIAGNOSIS — I4891 Unspecified atrial fibrillation: Secondary | ICD-10-CM

## 2012-05-02 DIAGNOSIS — Z7901 Long term (current) use of anticoagulants: Secondary | ICD-10-CM

## 2012-05-02 NOTE — Patient Instructions (Addendum)
Then continue Increase to 2.5mg  (1/2 tab) everyday. Recheck in 4 weeks. Eat 1-2 more servings of greens this week.    Latest dosing instructions   Total Sun Mon Tue Wed Thu Fri Sat   17.5 2.5 mg 2.5 mg 2.5 mg 2.5 mg 2.5 mg 2.5 mg 2.5 mg    (5 mg0.5) (5 mg0.5) (5 mg0.5) (5 mg0.5) (5 mg0.5) (5 mg0.5) (5 mg0.5)

## 2012-05-02 NOTE — Telephone Encounter (Signed)
Pt aware.

## 2012-05-04 ENCOUNTER — Ambulatory Visit (INDEPENDENT_AMBULATORY_CARE_PROVIDER_SITE_OTHER): Payer: Medicare Other | Admitting: Family Medicine

## 2012-05-04 ENCOUNTER — Encounter: Payer: Self-pay | Admitting: Family Medicine

## 2012-05-04 VITALS — BP 140/80 | Temp 99.8°F | Wt 142.0 lb

## 2012-05-04 DIAGNOSIS — Z299 Encounter for prophylactic measures, unspecified: Secondary | ICD-10-CM

## 2012-05-04 DIAGNOSIS — R05 Cough: Secondary | ICD-10-CM

## 2012-05-04 DIAGNOSIS — IMO0002 Reserved for concepts with insufficient information to code with codable children: Secondary | ICD-10-CM

## 2012-05-04 DIAGNOSIS — S83209A Unspecified tear of unspecified meniscus, current injury, unspecified knee, initial encounter: Secondary | ICD-10-CM

## 2012-05-04 MED ORDER — AZITHROMYCIN 250 MG PO TABS: ORAL_TABLET | ORAL | Status: AC

## 2012-05-04 MED ORDER — TETANUS-DIPHTH-ACELL PERTUSSIS 5-2.5-18.5 LF-MCG/0.5 IM SUSP: 0.5000 mL | Freq: Once | INTRAMUSCULAR | Status: DC

## 2012-05-04 NOTE — Progress Notes (Signed)
  Subjective:    Patient ID: Catherine [name], female    DOB: 1935/08/14, 77 y.o.   MRN: [id number]  HPI  Acute visit. Cough for one week. Progressive. Productive cough. Has had low-grade fever around 100 past few days. Patient concerned about pneumonia. Prior history of pneumonia. No nausea or vomiting. Mild nasal congestion. Mild dyspnea with activity.  Recent left knee pain. Duration over 2 months. MRI revealed probable medial meniscal tear and question of subchondral stress fracture left medial condyle. Orthopedic consult pending at this time. She still having significant pain which is impairing ambulation. No major effusion.  Past Medical History  Diagnosis Date  . Hypertension   . Atrial fibrillation, chronic   . Heart murmur   . Arthritis   . UTI (urinary tract infection)   . Chronic kidney disease   . Valvular heart disease     Has mitral stenosis with prior mitral commissurotomy in 1970  . H/O: rheumatic fever   . Chronic anticoagulation    Past Surgical History  Procedure Date  . Cardiac catheterization   . Back surgery     neurosurgery  . Commissurotomy 1970  . Other surgical history     hysterectomy  . Cataract surg     reports that she has quit smoking. She does not have any smokeless tobacco history on file. She reports that she does not drink alcohol or use illicit drugs. family history includes Leukemia in her father. Allergies  Allergen Reactions  . Ace Inhibitors   . Aldactone (Spironolactone)     dyspnea  . Amoxicillin     tachy  . Benazepril Hcl     cough  . Ciprocin-Fluocin-Procin (Fluocinolone Acetonide)   . Codeine   . Diltiazem     ha  . Flagyl (Metronidazole Hcl)   . Flovent (Fluticasone Propionate)     Leg cramps  . Nitrofurantoin Monohyd Macro     nausea  . Quinidine     Fever diarrhea  . Verapamil     myalgias      Review of Systems  Constitutional: Positive for fatigue. Negative for fever and chills.  Respiratory: Positive for  cough and shortness of breath. Negative for wheezing.   Cardiovascular: Negative for chest pain, palpitations and leg swelling.  Gastrointestinal: Negative for nausea and vomiting.  Neurological: Negative for headaches.       Objective:   Physical Exam  Constitutional: She appears well-developed and well-nourished.  HENT:  Mouth/Throat: Oropharynx is clear and moist.  Neck: Neck supple.  Cardiovascular: Normal rate.   Pulmonary/Chest: Effort normal and breath sounds normal. No respiratory distress. She has no wheezes. She has no rales.       Slightly diminished aeration left upper lobe compared with right L. is clear. No rales  Musculoskeletal: She exhibits no edema.       Left knee reveals medial joint line tenderness. No effusion. No warmth. Full range of motion.          Assessment & Plan:  #1 productive cough associated with fever. Start Zithromax. X-ray of chest if not improving over the next few days. Continue Mucinex. She is aware this can affect pro time. #2 left knee pain. Possible subchondral stress fracture medial femoral condyle and probable medial meniscal tear. Orthopedic referral

## 2012-05-04 NOTE — Patient Instructions (Addendum)
Follow up promptly for any fever or increased shortness of breath. 

## 2012-05-10 ENCOUNTER — Other Ambulatory Visit: Payer: Self-pay | Admitting: Specialist

## 2012-05-10 DIAGNOSIS — Z780 Asymptomatic menopausal state: Secondary | ICD-10-CM

## 2012-05-17 ENCOUNTER — Ambulatory Visit
Admission: RE | Admit: 2012-05-17 | Discharge: 2012-05-17 | Disposition: A | Discharge status: Home / Self Care | Payer: Medicare Other | Source: Ambulatory Visit | Attending: Specialist | Admitting: Specialist

## 2012-05-17 DIAGNOSIS — Z780 Asymptomatic menopausal state: Secondary | ICD-10-CM

## 2012-06-05 ENCOUNTER — Encounter: Payer: Medicare Other | Admitting: Family

## 2012-06-06 ENCOUNTER — Ambulatory Visit (INDEPENDENT_AMBULATORY_CARE_PROVIDER_SITE_OTHER): Payer: Medicare Other | Admitting: Family Medicine

## 2012-06-06 ENCOUNTER — Encounter: Payer: Self-pay | Admitting: Family Medicine

## 2012-06-06 ENCOUNTER — Ambulatory Visit (INDEPENDENT_AMBULATORY_CARE_PROVIDER_SITE_OTHER): Payer: Medicare Other | Admitting: Family

## 2012-06-06 VITALS — BP 150/90 | Temp 97.0°F | Wt 142.0 lb

## 2012-06-06 DIAGNOSIS — E7800 Pure hypercholesterolemia, unspecified: Secondary | ICD-10-CM

## 2012-06-06 DIAGNOSIS — M810 Age-related osteoporosis without current pathological fracture: Secondary | ICD-10-CM | POA: Insufficient documentation

## 2012-06-06 DIAGNOSIS — I4891 Unspecified atrial fibrillation: Secondary | ICD-10-CM

## 2012-06-06 DIAGNOSIS — Z7901 Long term (current) use of anticoagulants: Secondary | ICD-10-CM

## 2012-06-06 DIAGNOSIS — R7989 Other specified abnormal findings of blood chemistry: Secondary | ICD-10-CM

## 2012-06-06 DIAGNOSIS — Z79899 Other long term (current) drug therapy: Secondary | ICD-10-CM

## 2012-06-06 LAB — HEPATIC FUNCTION PANEL
ALT: 21 U/L (ref 0–35)
AST: 28 U/L (ref 0–37)
Albumin: 4.3 g/dL (ref 3.5–5.2)

## 2012-06-06 LAB — BASIC METABOLIC PANEL
CO2: 33 mEq/L — ABNORMAL HIGH (ref 19–32)
Calcium: 9.5 mg/dL (ref 8.4–10.5)
GFR: 103.61 mL/min (ref >60.00)
Sodium: 142 mEq/L (ref 135–145)

## 2012-06-06 LAB — LIPID PANEL
Cholesterol: 210 mg/dL — ABNORMAL HIGH (ref 0–200)
Total CHOL/HDL Ratio: 4
Triglycerides: 120 mg/dL (ref 0.0–149.0)

## 2012-06-06 MED ORDER — IBANDRONATE SODIUM 150 MG PO TABS: 150.0000 mg | ORAL_TABLET | ORAL | Status: DC

## 2012-06-06 NOTE — Patient Instructions (Addendum)
Osteoporosis Osteoporosis is a disease of the bones that makes them weaker and prone to break (fracture). By their mid-30s, most people begin to gradually lose bone strength. If this is severe enough, osteoporosis may occur. Osteopenia is a less severe weakness of the bones, which places you at risk for osteoporosis. It is important to identify if you have osteoporosis or osteopenia. Bone fractures from osteoporosis (especially hip and spine fractures) are a major cause of hospitalization, loss of independence, and can lead to life-threatening complications. CAUSES  There are a number of causes and risk factors:  Gender. Women are at a higher risk for osteoporosis than men.   Age. Bone formation slows down with age.   Ethnicity. For unclear reasons, white and Asian women are at higher risk for osteoporosis. Hispanic and African American women are at increased, but lesser, risk.   Family history of osteoporosis can mean that you are at a higher risk for getting it.   History of bone fractures indicates you may be at higher risk of another.   Calcium is very important for bone health and strength. Not enough calcium in your diet increases your risk for osteoporosis. Vitamin D is important for calcium metabolism. You get vitamin D from sunlight, foods, or supplements.   Physical activity. Bones get stronger with weight-bearing exercise and weaker without use.   Smoking is associated with decreased bone strength.   Medicines. Cortisone medicines, too much thyroid medicine, some cancer and seizure medicines, and others can weaken bones and cause osteoporosis.   Decreased body weight is associated with osteoporosis. The small amount of estrogen-type molecules produced in fat cells seems to protect the bones.   Menopausal decrease in the hormone estrogen can cause osteoporosis.   Low levels of the hormone testosterone can cause osteoporosis.   Some medical conditions can lead to osteoporosis  (hyperthyroidism, hyperparathyroidism, B12 deficiency).  SYMPTOMS  Usually, no symptoms are felt as the bones weaken. The first symptoms are generally related to bone fractures. You may have silent, tiny bone fractures, especially in your spine. This can cause height loss and forward bending of the spine (kyphosis). DIAGNOSIS  You or your caregiver may suspect osteoporosis based on height loss and kyphosis. Osteoporosis or osteopenia may be identified on an X-ray done for other reasons. A bone density measurement will likely be taken. Your bones are often measured at your lower spine or your hips. Measurement is done by an X-ray called a DEXA scan, or sometimes by a computerized X-ray scan (CT or CAT scan). Other tests may be done to find the cause of osteoporosis, such as blood tests to measure calcium and vitamin D, or to monitor treatment. TREATMENT  The goal of osteoporosis treatment is to prevent fractures. This is done through medicine and home care treatments. Treatment will slow the weakening of your bones and strengthen them where possible. Measures to decrease the likelihood of falling and fracturing a bone are also important. Medicine  You may need supplements if you are not getting enough calcium, vitamin D, and vitamin B12.   If you are female and menopausal, you should discuss the option of estrogen replacement or estrogen-like medicine with your caregiver.   Medicines can be taken by mouth or injection to help build bone strength. When taken by mouth, there are important directions that you need to follow.   Calcitonin is a hormone made by the thyroid gland that can help build bone strength and decrease fracture risk in the spine. It   can be taken by nasal spray or injection.   Parathyroid hormone can be injected to help build bone strength.   You will need to continue to get enough calcium intake with any of these medicines.  FALL PREVENTION  If you are unsteady on your feet, use  a cane, walker, or walk with someone's help.   Remove loose rugs or electrical cords from your home.   Keep your home well lit at night. Use glasses if you need them.   Avoid icy streets and wet or waxed floors.   Hold the railing when using stairs.   Watch out for your pets.   Install grab bars in your bathroom.   Exercise. Physical activity, especially weight-bearing exercise, helps strengthen bones. Strength and balance exercise, such as tai chi, helps prevent falls.   Alcohol and some medicines can make you more likely to fall. Discuss alcohol use with your caregiver. Ask your caregiver if any of your medicines might increase your risk for falling. Ask if safer alternatives are available.  HOME CARE INSTRUCTIONS   Try to prevent and avoid falls.   To pick up objects, bend at the knees. Do not bend with your back.   Do not smoke. If you smoke, ask for help to stop.   Have adequate calcium and vitamin D in your diet. Talk with your caregiver about amounts.   Before exercising, ask your caregiver what exercises will be good for you.   Only take over-the-counter or prescription medicines for pain, discomfort, or fever as directed by your caregiver.  SEEK MEDICAL CARE IF:   You have had a fracture and your pain is not controlled.   You have had a fracture and you are not able to return to activities as expected.   You are reinjured.   You develop side effects from medicines, especially stomach pain or trouble swallowing.   You develop new, unexplained problems.  SEEK IMMEDIATE MEDICAL CARE IF:   You develop sudden, severe pain in your back.   You develop pain after an injury or fall.  Document Released: 06/23/2005 Document Revised: 09/02/2011 Document Reviewed: 08/28/2011 [facility] Patient Information 2012 [place], [place].  Make sure you are getting at least 800 IU of Vit D per day and calcium 1,200 mg per day

## 2012-06-06 NOTE — Progress Notes (Signed)
  Subjective:    Patient ID: Catherine [name], female    DOB: May 20, 1935, 77 y.o.   MRN: [id number]  HPI  Patient is here to discuss recent bone density scan. She had T score -2.7 femur. No prior history of osteoporosis. No history of fractures. Had some recent knee surgery and is recovering from that. She usually walks but has recently tried to do some cycling. She's had intolerance of many medications but has never been treated with bisphosphonates. She's had some difficulties tolerating calcium supplements. She's not taking any consistent calcium or vitamin D other than and multivitamin. She's not had previous vitamin D level to her knowledge. No excessive caffeine use.  Review of Systems  Respiratory: Negative for shortness of breath.   Cardiovascular: Negative for chest pain.  Musculoskeletal: Negative for myalgias and arthralgias.       Objective:   Physical Exam  Constitutional: She appears well-developed and well-nourished.  Cardiovascular: Normal rate and regular rhythm.   Pulmonary/Chest: Effort normal and breath sounds normal. No respiratory distress. She has no wheezes. She has no rales.  Musculoskeletal: She exhibits no edema.          Assessment & Plan:  Osteoporosis. Vitamin D at least 800 international units daily and calcium at least 1200 mg daily. Discussed options. Start Boniva 150 mg once per month. Reviewed proper use and reviewed possible side effects. Add 25-hydroxy vitamin D level. Weightbearing exercise as much as tolerated

## 2012-06-06 NOTE — Patient Instructions (Signed)
2.5mg  (1/2 tab) everyday. Recheck in 4 weeks.    Latest dosing instructions   Total Sun Mon Tue Wed Thu Fri Sat   17.5 2.5 mg 2.5 mg 2.5 mg 2.5 mg 2.5 mg 2.5 mg 2.5 mg    (5 mg0.5) (5 mg0.5) (5 mg0.5) (5 mg0.5) (5 mg0.5) (5 mg0.5) (5 mg0.5)

## 2012-06-06 NOTE — Addendum Note (Signed)
Addended by: Rita Ohara R on: 06/06/2012 10:12 AM   Modules accepted: Orders

## 2012-06-07 ENCOUNTER — Other Ambulatory Visit: Payer: Self-pay | Admitting: *Deleted

## 2012-06-07 MED ORDER — SM CALCIUM/VITAMIN D3 600-800 MG-UNIT PO TABS: 800.0000 | ORAL_TABLET | Freq: Every day | ORAL | Status: DC

## 2012-06-07 NOTE — Progress Notes (Signed)
Quick Note:  Pt informed ______ 

## 2012-06-10 ENCOUNTER — Other Ambulatory Visit: Payer: Self-pay | Admitting: Cardiology

## 2012-06-12 NOTE — Telephone Encounter (Signed)
Refilled furosemide

## 2012-06-16 ENCOUNTER — Telehealth: Payer: Self-pay | Admitting: Cardiology

## 2012-06-16 NOTE — Telephone Encounter (Signed)
Pt calling for lab results--lab results given and copy mailed to pt

## 2012-06-16 NOTE — Telephone Encounter (Signed)
New problem:  Test results.  

## 2012-06-20 ENCOUNTER — Telehealth: Payer: Self-pay | Admitting: *Deleted

## 2012-06-20 NOTE — Telephone Encounter (Signed)
Advised patient of lab results and mailed copy 

## 2012-06-20 NOTE — Telephone Encounter (Signed)
Message copied by Burnell Blanks on Tue Jun 20, 2012  8:44 AM ------      Message from: Cassell Clement      Created: Sun Jun 18, 2012  4:27 PM       Please report.  Cholesterol and TGs are better. LFTs are normal.  CSD.

## 2012-07-04 ENCOUNTER — Ambulatory Visit (INDEPENDENT_AMBULATORY_CARE_PROVIDER_SITE_OTHER): Payer: Medicare Other | Admitting: Family

## 2012-07-04 DIAGNOSIS — Z7901 Long term (current) use of anticoagulants: Secondary | ICD-10-CM

## 2012-07-04 DIAGNOSIS — I4891 Unspecified atrial fibrillation: Secondary | ICD-10-CM

## 2012-07-04 NOTE — Patient Instructions (Addendum)
Hold tab today only. Eat more greens. Then continue 2.5mg  (1/2 tab) everyday. Recheck in 4 weeks.    Latest dosing instructions   Total Sun Mon Tue Wed Thu Fri Sat   17.5 2.5 mg 2.5 mg 2.5 mg 2.5 mg 2.5 mg 2.5 mg 2.5 mg    (5 mg0.5) (5 mg0.5) (5 mg0.5) (5 mg0.5) (5 mg0.5) (5 mg0.5) (5 mg0.5)

## 2012-07-05 ENCOUNTER — Telehealth: Payer: Self-pay | Admitting: Family

## 2012-07-05 NOTE — Telephone Encounter (Signed)
Pt stated appt with Dr Patty Sermons was resch until 08-11-2012. Pt was seen yesterday for coumadin which was too thin and must repeat in 4 wk. Pt would like to know if she can wait until 08-11-2012 to repeat protime at Dr Patty Sermons office.  Please put order in system.

## 2012-07-06 NOTE — Telephone Encounter (Signed)
Pt aware, per Padonda, waiting 10 days longer is too far out due to the fact that she was too thin at last visit. Pt/inr appt scheduled

## 2012-07-17 ENCOUNTER — Other Ambulatory Visit: Payer: Self-pay | Admitting: Cardiology

## 2012-08-01 ENCOUNTER — Ambulatory Visit (INDEPENDENT_AMBULATORY_CARE_PROVIDER_SITE_OTHER): Payer: Medicare Other | Admitting: Family

## 2012-08-01 DIAGNOSIS — Z7901 Long term (current) use of anticoagulants: Secondary | ICD-10-CM

## 2012-08-01 DIAGNOSIS — I4891 Unspecified atrial fibrillation: Secondary | ICD-10-CM

## 2012-08-01 NOTE — Patient Instructions (Addendum)
2.5mg (1/2 tab) everyday. Recheck in 4 weeks.    Latest dosing instructions   Total Sun Mon Tue Wed Thu Fri Sat   17.5 2.5 mg 2.5 mg 2.5 mg 2.5 mg 2.5 mg 2.5 mg 2.5 mg    (5 mg0.5) (5 mg0.5) (5 mg0.5) (5 mg0.5) (5 mg0.5) (5 mg0.5) (5 mg0.5)        

## 2012-08-02 ENCOUNTER — Ambulatory Visit: Payer: Medicare Other | Admitting: Cardiology

## 2012-08-11 ENCOUNTER — Encounter: Payer: Self-pay | Admitting: Cardiology

## 2012-08-11 ENCOUNTER — Ambulatory Visit (INDEPENDENT_AMBULATORY_CARE_PROVIDER_SITE_OTHER): Payer: Medicare Other | Admitting: Cardiology

## 2012-08-11 VITALS — BP 130/60 | HR 71 | Resp 18 | Ht 67.0 in | Wt 142.0 lb

## 2012-08-11 DIAGNOSIS — I050 Rheumatic mitral stenosis: Secondary | ICD-10-CM

## 2012-08-11 DIAGNOSIS — E7800 Pure hypercholesterolemia, unspecified: Secondary | ICD-10-CM

## 2012-08-11 DIAGNOSIS — M1990 Unspecified osteoarthritis, unspecified site: Secondary | ICD-10-CM | POA: Insufficient documentation

## 2012-08-11 DIAGNOSIS — I4891 Unspecified atrial fibrillation: Secondary | ICD-10-CM

## 2012-08-11 DIAGNOSIS — Z79899 Other long term (current) drug therapy: Secondary | ICD-10-CM

## 2012-08-11 NOTE — Assessment & Plan Note (Signed)
The patient has not been having any orthopnea or paroxysmal nocturnal dyspnea.  Exertional dyspnea has improved on high dose Lasix and she is also on Maxzide.  When she tried to reduce her Maxide her dyspnea became worse.

## 2012-08-11 NOTE — Assessment & Plan Note (Signed)
The patient has been on long-term Coumadin.  She is not having any problems with her INR.  Her INR is checked at Dr. Lucie Leather office.  The patient has not had any TIA symptoms.

## 2012-08-11 NOTE — Progress Notes (Signed)
Catherine [name] Date of Birth:  1935/04/18 [facility] [address] [place], [place]  [address] [phone]         Fax   [phone]  History of Present Illness: This pleasant 77 year old woman is seen for a scheduled followup office visit. She has a past history of rheumatic fever and rheumatic heart disease. She has a history of mitral stenosis. In 1970s she underwent mitral commissurotomy by Dr. Micah Noel her last echocardiogram was in 2011 and showed a normal ejection fraction of 55-60% biatrial enlargement, mild aortic stenosis, mild mitral stenosis with moderate to severe mitral regurgitation, and mild tricuspid regurgitation with normal pulmonary artery pressure. Her mitral valve area was 1.9.  She is in chronic established atrial fibrillation.  She is on long-term Coumadin.   Current Outpatient Prescriptions  Medication Sig Dispense Refill  . digoxin (LANOXIN) 0.25 MG tablet 1/2 tablet daily  90 tablet  3  . ibandronate (BONIVA) 150 MG tablet Take 1 tablet (150 mg total) by mouth every 30 (thirty) days. Take in the morning with a full glass of water, on an empty stomach, and do not take anything else by mouth or lie down for the next 30 min.  3 tablet  3  . LASIX 80 MG tablet TAKE 1 TABLET TWICE A DAY OR AS DIRECTED  90 tablet  PRN  . Multiple Vitamin (MULTIVITAMIN) tablet Take 1 tablet by mouth daily.        . polyethylene glycol-electrolytes (NULYTELY/GOLYTELY) 420 G solution       . potassium chloride (K-DUR) 10 MEQ tablet 2 tablets four times a day  (total of 8 per day  720 tablet  3  . TENORMIN 25 MG tablet TAKE (1/2) TABLET DAILY.  90 each  3  . triamterene-hydrochlorothiazide (MAXZIDE-25) 37.5-25 MG per tablet Take 1 tablet by mouth daily.      Marland Kitchen VALIUM 5 MG tablet TAKE (1) TABLET DAILY AS NEEDED.  30 each  5  . vitamin B-12 (CYANOCOBALAMIN) 250 MCG tablet Take 250 mcg by mouth daily.      Marland Kitchen warfarin (COUMADIN) 5 MG tablet TAKE AS DIRECTED.  90 tablet   0  . [DISCONTINUED] triamterene-hydrochlorothiazide (MAXZIDE-25) 37.5-25 MG per tablet 1/2 tablet daily      . Calcium Carbonate-Vit D-Min (SM CALCIUM/VITAMIN D3) 600-800 MG-UNIT TABS Take 800 each by mouth daily.  1 tablet  0    Allergies  Allergen Reactions  . Ace Inhibitors   . Aldactone (Spironolactone)     dyspnea  . Amoxicillin     tachy  . Benazepril Hcl     cough  . Ciprocin-Fluocin-Procin (Fluocinolone Acetonide)   . Codeine   . Diltiazem     ha  . Flagyl (Metronidazole Hcl)   . Flovent (Fluticasone Propionate)     Leg cramps  . Nitrofurantoin Monohyd Macro     nausea  . Quinidine     Fever diarrhea  . Verapamil     myalgias    Patient Active Problem List  Diagnosis  . RHEUMATIC MITRAL STENOSIS  . RHEUMATIC HEART DISEASE  . VALVULAR HEART DISEASE  . Atrial Fibrillation  . COPD  . DYSPNEA  . COUGH  . Atrial fibrillation  . Low back pain  . Benign hypertensive heart disease without heart failure  . Hypercholesterolemia  . Encounter for long-term (current) use of anticoagulants  . Osteoporosis    History  Smoking status  . Former Smoker  Smokeless tobacco  . Not  on file    History  Alcohol Use No    Family History  Problem Relation Age of Onset  . Leukemia Father     Review of Systems: Constitutional: no fever chills diaphoresis or fatigue or change in weight.  Head and neck: no hearing loss, no epistaxis, no photophobia or visual disturbance. Respiratory: No cough, shortness of breath or wheezing. Cardiovascular: No chest pain peripheral edema, palpitations. Gastrointestinal: No abdominal distention, no abdominal pain, no change in bowel habits hematochezia or melena. Genitourinary: No dysuria, no frequency, no urgency, no nocturia. Musculoskeletal:No arthralgias, no back pain, no gait disturbance or myalgias. Neurological: No dizziness, no headaches, no numbness, no seizures, no syncope, no weakness, no tremors. Hematologic: No  lymphadenopathy, no easy bruising. Psychiatric: No confusion, no hallucinations, no sleep disturbance.    Physical Exam: Filed Vitals:   08/11/12 1027  BP: 130/60  Pulse: 71  Resp: 18   the general appearance reveals a well-developed elderly woman in no distress.The head and neck exam reveals pupils equal and reactive.  Extraocular movements are full.  There is no scleral icterus.  The mouth and pharynx are normal.  The neck is supple.  The carotids reveal no bruits.  The jugular venous pressure is normal.  The  thyroid is not enlarged.  There is no lymphadenopathy.  The chest is clear to percussion and auscultation.  There are no rales or rhonchi.  Expansion of the chest is symmetrical.  The precordium is quiet.  The first heart sound is accentuated  The second heart sound is physiologically split.  There is a grade 2/6 systolic murmur of mitral regurgitation.  There is also a soft diastolic apical rumble.  There is no abnormal lift or heave.  The abdomen is soft and nontender.  The bowel sounds are normal.  The liver and spleen are not enlarged.  There are no abdominal masses.  There are no abdominal bruits.  Extremities reveal good pedal pulses.  There is no phlebitis or edema.  There is no cyanosis or clubbing.  Strength is normal and symmetrical in all extremities.  There is no lateralizing weakness.  There are no sensory deficits.  The skin is warm and dry.  There is no rash.     Assessment / Plan: Continue on same medication.  Recheck in 6 months for followup office visit EKG CBC lipid panel hepatic function panel and basal metabolic panel.

## 2012-08-11 NOTE — Patient Instructions (Addendum)
Your physician recommends that you continue on your current medications as directed. Please refer to the Current Medication list given to you today.  Your physician wants you to follow-up in: 6 months with fasting labs (lp/bmet/hfp)  You will receive a reminder letter in the mail two months in advance. If you don't receive a letter, please call our office to schedule the follow-up appointment.  

## 2012-08-11 NOTE — Assessment & Plan Note (Signed)
The patient has been having left knee pain.  She has seen Dr. Jillyn Hidden, her orthopedist, who diagnosed a stress fracture of her left knee as well as a tear of her meniscus.  She is not able to use her treadmill at the present time but does have a stationary bike.

## 2012-09-05 ENCOUNTER — Ambulatory Visit (INDEPENDENT_AMBULATORY_CARE_PROVIDER_SITE_OTHER): Payer: Medicare Other | Admitting: Family Medicine

## 2012-09-05 ENCOUNTER — Encounter: Payer: Self-pay | Admitting: Family Medicine

## 2012-09-05 ENCOUNTER — Ambulatory Visit (INDEPENDENT_AMBULATORY_CARE_PROVIDER_SITE_OTHER): Payer: Medicare Other | Admitting: Family

## 2012-09-05 VITALS — BP 130/78 | Temp 98.0°F | Wt 144.0 lb

## 2012-09-05 DIAGNOSIS — M25469 Effusion, unspecified knee: Secondary | ICD-10-CM

## 2012-09-05 DIAGNOSIS — M542 Cervicalgia: Secondary | ICD-10-CM

## 2012-09-05 DIAGNOSIS — I4891 Unspecified atrial fibrillation: Secondary | ICD-10-CM

## 2012-09-05 DIAGNOSIS — M810 Age-related osteoporosis without current pathological fracture: Secondary | ICD-10-CM

## 2012-09-05 DIAGNOSIS — Z7901 Long term (current) use of anticoagulants: Secondary | ICD-10-CM

## 2012-09-05 DIAGNOSIS — M25461 Effusion, right knee: Secondary | ICD-10-CM

## 2012-09-05 MED ORDER — IBANDRONATE SODIUM 150 MG PO TABS: 150.0000 mg | ORAL_TABLET | ORAL | Status: DC

## 2012-09-05 NOTE — Patient Instructions (Addendum)
Osteoporosis  Throughout your life, your body breaks down old bone and replaces it with new bone. As you get older, your body does not replace bone as quickly as it breaks it down. By the age of 30 years, most people begin to gradually lose bone because of the imbalance between bone loss and replacement. Some people lose more bone than others. Bone loss beyond a specified normal degree is considered osteoporosis.    Osteoporosis affects the strength and durability of your bones. The inside of the ends of your bones and your flat bones, like the bones of your pelvis, look like honeycomb, filled with tiny open spaces. As bone loss occurs, your bones become less dense. This means that the open spaces inside your bones become bigger and the walls between these spaces become thinner. This makes your bones weaker. Bones of a person with osteoporosis can become so weak that they can break (fracture) during minor accidents, such as a simple fall.  CAUSES    The following factors have been associated with the development of osteoporosis:   Smoking.   Drinking more than 2 alcoholic drinks several days per week.   Long-term use of certain medicines:   Corticosteroids.   Chemotherapy medicines.   Thyroid medicines.   Antiepileptic medicines.   Gonadal hormone suppression medicine.   Immunosuppression medicine.   Being underweight.   Lack of physical activity.   Lack of exposure to the sun. This can lead to vitamin D deficiency.   Certain medical conditions:   Certain inflammatory bowel diseases, such as Crohn's disease and ulcerative colitis.   Diabetes.   Hyperthyroidism.   Hyperparathyroidism.  RISK FACTORS  Anyone can develop osteoporosis. However, the following factors can increase your risk of developing osteoporosis:   Gender Women are at higher risk than men.   Age Being older than 50 years increases your risk.   Ethnicity White and Asian people have an increased risk.    Weight Being extremely underweight can increase your risk of osteoporosis.   Family history of osteoporosis Having a family member who has developed osteoporosis can increase your risk.  SYMPTOMS    Usually, people with osteoporosis have no symptoms.    DIAGNOSIS    Signs during a physical exam that may prompt your caregiver to suspect osteoporosis include:   Decreased height. This is usually caused by the compression of the bones that form your spine (vertebrae) because they have weakened and become fractured.   A curving or rounding of the upper back (kyphosis).  To confirm signs of osteoporosis, your caregiver may request a procedure that uses 2 low-dose X-ray beams with different levels of energy to measure your bone mineral density (dual-energy X-ray absorptiometry [DXA]). Also, your caregiver may check your level of vitamin D.  TREATMENT    The goal of osteoporosis treatment is to strengthen bones in order to decrease the risk of bone fractures. There are different types of medicines available to help achieve this goal. Some of these medicines work by slowing the processes of bone loss. Some medicines work by increasing bone density. Treatment also involves making sure that your levels of calcium and vitamin D are adequate.  PREVENTION    There are things you can do to help prevent osteoporosis. Adequate intake of calcium and vitamin D can help you achieve optimal bone mineral density. Regular exercise can also help, especially resistance and high-impact activities. If you smoke, quitting smoking is an important part of osteoporosis prevention.  MAKE 

## 2012-09-05 NOTE — Progress Notes (Signed)
Subjective:     Patient ID: Catherine [name], female   DOB: 09-25-1935, 77 y.o.   MRN: [id number]  HPI Medical follow-up.  Current medical issues include a fib, hypertension, valvular heart disease, mitral stenosis, and osteoporosis.  She is managed for her cardiac issues by Dr. Patty Sermons.  Currently on Oscal with Vit D (600mg /500IU) 2 chews/day with food for the past 3 weeks.  Has had issues with intolerance of calcium supplements in the past, but so far has tolerated these well.  Was supposed to have started Boniva once monthly after last visit, but she does not recall this and has not been taking.  Recent DEXA in August showed T score -2.7.  Patient complains of R knee pain, worse with weight bearing and in the morning.  She is able to use her stationary bicycle without pain.  She also reports some associated swelling.  Has been managing with Tylenol PRN and a heating pad.  Also noticed some improvement with an elastic compression brace.  Denies any trauma or torquing to this knee.  She also has been having some ongoing issues with L-sided neck and shoulder pain on rotating her head to the left, with some reduction in range of motion compared to the right.  She has a history of cervical spine surgery with some sort of titanium hardware placed many years ago.  Denies tingling or numbness.  Review of Systems  Musculoskeletal: Positive for joint swelling (as per HPI) and arthralgias (R knee pain as per HPI). Negative for gait problem.       Neck pain as per HPI  Neurological: Negative for weakness and numbness.       Objective:   Physical Exam  Constitutional: She is oriented to person, place, and time. She appears well-developed and well-nourished.  HENT:  Head: Normocephalic and atraumatic.  Neck:       Slightly limited ROM with pain on rotation of the head to the L vs R.  Cardiovascular:       Irregularly irregular. II/VI systolic ejection murmur at LLSB.  Pulmonary/Chest: Effort normal  and breath sounds normal. No respiratory distress. She has no wheezes.  Musculoskeletal: Normal range of motion. She exhibits edema (Some evidence of slight effusion superior and lateral to the patella.) and tenderness (R medial knee tenderness to palpation.).       No crepitus with flexion or extension of R knee. No palpable click with valgus and internal rotation in R knee. LE strength 5/5 bilaterally with flexion and extension at the knees and hips without pain; same with plantar- and dorsiflexion.  Neurological: She is alert and oriented to person, place, and time. She has normal reflexes. She exhibits normal muscle tone. Coordination normal.       Brachioradialis and triceps reflexes symmetrical.       Assessment:     77 year old here for medical followup and evaluation of knee and neck complaints.    Plan:     1. R knee pain: could be small medial meniscus tear, but patient denies any trauma or torquing of R knee.  Stability appears intact, as does neurological function.  For now continue to monitor, with rest, ice, compression and elevation and see if symptoms persist or worsen over the next several weeks.  At that time, would try an imaging study, perhaps MRI and/or refer to orthopedics for further evaluation.  Patient has seen Dr. Jillyn Hidden in the past for issues with her left knee. 2. Neck pain: with  patient's history of past surgery, this likely is nerve impingement related to that.  Currently sounds bearable, without numbness, tingling, or radiation down her arm.  Continue to monitor, and refer to orthopedics if patient feels that it is becoming increasingly worse or unbearable. 3. Osteoporosis: re-wrote Rx for Boniva once monthly, which patient states she will start.  Reviewed proper use and possible side effects.  Continue Oscal with Vit D 2 chews daily as well.  Follow up again in 3 months.  Marthann Schiller, MS3    Agree with assessment and plan as per Marthann Schiller, MS 3 Evelena Peat  MD

## 2012-09-05 NOTE — Patient Instructions (Signed)
Eat a few more servings of greens over the next couple days. Hold dose x 1 day. 2.5mg  (1/2 tab) everyday. Recheck in 2 weeks.    Latest dosing instructions   Total Sun Mon Tue Wed Thu Fri Sat   17.5 2.5 mg 2.5 mg 2.5 mg 2.5 mg 2.5 mg 2.5 mg 2.5 mg    (5 mg0.5) (5 mg0.5) (5 mg0.5) (5 mg0.5) (5 mg0.5) (5 mg0.5) (5 mg0.5)

## 2012-09-12 ENCOUNTER — Other Ambulatory Visit: Payer: Self-pay | Admitting: Cardiology

## 2012-09-12 ENCOUNTER — Other Ambulatory Visit: Payer: Self-pay | Admitting: *Deleted

## 2012-09-12 DIAGNOSIS — I4891 Unspecified atrial fibrillation: Secondary | ICD-10-CM

## 2012-09-12 MED ORDER — DIGOXIN 250 MCG PO TABS: ORAL_TABLET | ORAL | Status: DC

## 2012-09-18 ENCOUNTER — Encounter: Payer: Self-pay | Admitting: Family Medicine

## 2012-09-18 ENCOUNTER — Ambulatory Visit (INDEPENDENT_AMBULATORY_CARE_PROVIDER_SITE_OTHER): Payer: Medicare Other | Admitting: Family Medicine

## 2012-09-18 ENCOUNTER — Telehealth: Payer: Self-pay | Admitting: *Deleted

## 2012-09-18 ENCOUNTER — Telehealth: Payer: Self-pay | Admitting: Family Medicine

## 2012-09-18 VITALS — BP 140/78 | Temp 98.0°F

## 2012-09-18 DIAGNOSIS — I4891 Unspecified atrial fibrillation: Secondary | ICD-10-CM

## 2012-09-18 DIAGNOSIS — R300 Dysuria: Secondary | ICD-10-CM

## 2012-09-18 LAB — POCT URINALYSIS DIPSTICK
Bilirubin, UA: NEGATIVE
Blood, UA: NEGATIVE
Spec Grav, UA: 1.015
pH, UA: 8.5

## 2012-09-18 MED ORDER — DIGOXIN 125 MCG PO TABS: 0.1250 mg | ORAL_TABLET | Freq: Every day | ORAL | Status: DC

## 2012-09-18 MED ORDER — LEVOFLOXACIN 500 MG PO TABS: 500.0000 mg | ORAL_TABLET | Freq: Every day | ORAL | Status: DC

## 2012-09-18 NOTE — Progress Notes (Signed)
  Subjective:    Patient ID: Catherine [name], female    DOB: Mar 02, 1935, 77 y.o.   MRN: [id number]  HPI  Acute visit. One week history of dark urine with foul odor and some frequency and occasional burning. She had similar symptoms with UTI in the past. No fevers. No chills. No nausea or vomiting. No gross hematuria. Multiple allergies as listed. She has listed Cipro allergy but has taken Levaquin in the past and is requesting the same. She is very nervous about taking other antibiotics because of prior intolerance. She is on Coumadin and has scheduled pro time in one week. No recent bleeding complications.  Review of Systems  Constitutional: Negative for fever, chills and appetite change.  Gastrointestinal: Negative for nausea, vomiting, abdominal pain, diarrhea and constipation.  Genitourinary: Positive for dysuria and frequency.  Musculoskeletal: Negative for back pain.  Neurological: Negative for dizziness.       Objective:   Physical Exam  Constitutional: She appears well-developed and well-nourished.  HENT:  Mouth/Throat: Oropharynx is clear and moist.  Cardiovascular: Normal rate.   Pulmonary/Chest: Effort normal and breath sounds normal. No respiratory distress. She has no wheezes. She has no rales.          Assessment & Plan:  Dysuria. Urine dipstick suggests likely UTI. Urine culture sent. Levaquin 500 mg daily for 7 days pending culture results. We discussed other options such as Keflex which are probably reasonable first line agents but she is very uneasy about trying meds she does not recall using in the past

## 2012-09-18 NOTE — Telephone Encounter (Signed)
Received notice from patients insurance that Lanoxin 0.25 mg will not be covered by insurance. Discussed with  Dr. Patty Sermons and will reduce to Lanoxin 0.125 mg daily.  Advised patients husband and sent Rx to pharmacy

## 2012-09-18 NOTE — Patient Instructions (Addendum)
Urinary Tract Infection Urinary tract infections (UTIs) can develop anywhere along your urinary tract. Your urinary tract is your body's drainage system for removing wastes and extra water. Your urinary tract includes two kidneys, two ureters, a bladder, and a urethra. Your kidneys are a pair of bean-shaped organs. Each kidney is about the size of your fist. They are located below your ribs, one on each side of your spine. CAUSES Infections are caused by microbes, which are microscopic organisms, including fungi, viruses, and bacteria. These organisms are so small that they can only be seen through a microscope. Bacteria are the microbes that most commonly cause UTIs. SYMPTOMS  Symptoms of UTIs may vary by age and gender of the patient and by the location of the infection. Symptoms in young women typically include a frequent and intense urge to urinate and a painful, burning feeling in the bladder or urethra during urination. Older women and men are more likely to be tired, shaky, and weak and have muscle aches and abdominal pain. A fever may mean the infection is in your kidneys. Other symptoms of a kidney infection include pain in your back or sides below the ribs, nausea, and vomiting. DIAGNOSIS To diagnose a UTI, your caregiver will ask you about your symptoms. Your caregiver also will ask to provide a urine sample. The urine sample will be tested for bacteria and white blood cells. White blood cells are made by your body to help fight infection. TREATMENT  Typically, UTIs can be treated with medication. Because most UTIs are caused by a bacterial infection, they usually can be treated with the use of antibiotics. The choice of antibiotic and length of treatment depend on your symptoms and the type of bacteria causing your infection. HOME CARE INSTRUCTIONS  If you were prescribed antibiotics, take them exactly as your caregiver instructs you. Finish the medication even if you feel better after you  have only taken some of the medication.  Drink enough water and fluids to keep your urine clear or pale yellow.  Avoid caffeine, tea, and carbonated beverages. They tend to irritate your bladder.  Empty your bladder often. Avoid holding urine for long periods of time.  Empty your bladder before and after sexual intercourse.  After a bowel movement, women should cleanse from front to back. Use each tissue only once. SEEK MEDICAL CARE IF:   You have back pain.  You develop a fever.  Your symptoms do not begin to resolve within 3 days. SEEK IMMEDIATE MEDICAL CARE IF:   You have severe back pain or lower abdominal pain.  You develop chills.  You have nausea or vomiting.  You have continued burning or discomfort with urination. MAKE SURE YOU:   Understand these instructions.  Will watch your condition.  Will get help right away if you are not doing well or get worse. Document Released: 06/23/2005 Document Revised: 03/14/2012 Document Reviewed: 10/22/2011 [employer] Patient Information 2013 [employer].  

## 2012-09-18 NOTE — Telephone Encounter (Signed)
Please advise 

## 2012-09-18 NOTE — Telephone Encounter (Signed)
Lets work in

## 2012-09-18 NOTE — Telephone Encounter (Signed)
Patient Information:  Caller Name: [place]  Phone: [phone]  Patient: Catherine [name], Catherine [name]  Gender: Female  DOB: 12/12/1934  Age: 77 Years  PCP: Evelena Peat [facility])  Office Follow Up:  Does the office need to follow up with this patient?: Yes  Instructions For The Office: Patient is a disposition see in 4 hours.  Please follow up with patient with further instruction or appointment time.  RN Note:  Patient states that she can only take Levoquin.  Reviewed Epic no appointments available.  Symptoms  Reason For Call & Symptoms: Urinary pain, pressure, odor  Reviewed Health History In EMR: Yes  Reviewed Medications In EMR: Yes  Reviewed Allergies In EMR: Yes  Reviewed Surgeries / Procedures: Yes  Date of Onset of Symptoms: 09/16/2012  Treatments Tried: Taking Azo  Treatments Tried Worked: No  Guideline(s) Used:  Urination Pain - Female  Disposition Per Guideline:   Go to Office Now  Reason For Disposition Reached:   Severe pain with urination  Advice Given:  Warm Saline SITZ Baths to Reduce Pain:  Sit in a warm saline bath for 20 minutes to cleanse the area and to reduce pain. Add 2 oz. of table salt or baking soda to a tub of water.  Call Back If:  You become worse.

## 2012-09-23 ENCOUNTER — Other Ambulatory Visit: Payer: Self-pay | Admitting: Family Medicine

## 2012-09-25 ENCOUNTER — Ambulatory Visit (INDEPENDENT_AMBULATORY_CARE_PROVIDER_SITE_OTHER): Payer: Medicare Other | Admitting: Family

## 2012-09-25 DIAGNOSIS — I4891 Unspecified atrial fibrillation: Secondary | ICD-10-CM

## 2012-09-25 DIAGNOSIS — Z7901 Long term (current) use of anticoagulants: Secondary | ICD-10-CM

## 2012-09-25 NOTE — Patient Instructions (Signed)
Eat a few more servings of greens over the next couple days. Continue same dose.     Latest dosing instructions   Total Sun Mon Tue Wed Thu Fri Sat   17.5 2.5 mg 2.5 mg 2.5 mg 2.5 mg 2.5 mg 2.5 mg 2.5 mg    (5 mg0.5) (5 mg0.5) (5 mg0.5) (5 mg0.5) (5 mg0.5) (5 mg0.5) (5 mg0.5)

## 2012-09-25 NOTE — Telephone Encounter (Signed)
Refill times three. 

## 2012-09-26 ENCOUNTER — Other Ambulatory Visit: Payer: Self-pay | Admitting: Family Medicine

## 2012-09-27 NOTE — Telephone Encounter (Signed)
Why is this coming back to me?  It appears this was refilled 12-28.  I know I approved it then.

## 2012-10-17 ENCOUNTER — Ambulatory Visit (INDEPENDENT_AMBULATORY_CARE_PROVIDER_SITE_OTHER): Payer: Medicare Other | Admitting: Family

## 2012-10-17 DIAGNOSIS — I4891 Unspecified atrial fibrillation: Secondary | ICD-10-CM

## 2012-10-17 DIAGNOSIS — Z7901 Long term (current) use of anticoagulants: Secondary | ICD-10-CM

## 2012-10-17 LAB — POCT INR: INR: 3.5

## 2012-10-17 NOTE — Patient Instructions (Signed)
Hold Coumadin today only. Eat a few more servings of greens over the next couple days. Continue same dose.     Latest dosing instructions   Total Sun Mon Tue Wed Thu Fri Sat   17.5 2.5 mg 2.5 mg 2.5 mg 2.5 mg 2.5 mg 2.5 mg 2.5 mg    (5 mg0.5) (5 mg0.5) (5 mg0.5) (5 mg0.5) (5 mg0.5) (5 mg0.5) (5 mg0.5)

## 2012-10-30 ENCOUNTER — Other Ambulatory Visit: Payer: Self-pay | Admitting: Cardiology

## 2012-10-30 DIAGNOSIS — Z1231 Encounter for screening mammogram for malignant neoplasm of breast: Secondary | ICD-10-CM

## 2012-11-07 ENCOUNTER — Ambulatory Visit (INDEPENDENT_AMBULATORY_CARE_PROVIDER_SITE_OTHER): Payer: Medicare Other | Admitting: Family

## 2012-11-07 DIAGNOSIS — I4891 Unspecified atrial fibrillation: Secondary | ICD-10-CM

## 2012-11-07 DIAGNOSIS — Z7901 Long term (current) use of anticoagulants: Secondary | ICD-10-CM

## 2012-11-07 LAB — POCT INR: INR: 3.4

## 2012-11-07 NOTE — Patient Instructions (Addendum)
Hold Coumadin today only. Eat a few more servings of greens over the next couple days. No coumadin on Monday only  Anticoagulation Dose Instructions as of 11/07/2012     Catherine [name] Tue Wed Thu Fri Sat   New Dose 2.5 mg 0 mg 2.5 mg 2.5 mg 2.5 mg 2.5 mg 2.5 mg    Description       Hold Coumadin today only. Eat a few more servings of greens over the next couple days. No coumadin on Monday only

## 2012-11-13 ENCOUNTER — Ambulatory Visit (INDEPENDENT_AMBULATORY_CARE_PROVIDER_SITE_OTHER): Payer: Medicare Other | Admitting: Family Medicine

## 2012-11-13 ENCOUNTER — Encounter: Payer: Self-pay | Admitting: Family Medicine

## 2012-11-13 VITALS — BP 110/62 | HR 70 | Temp 98.9°F | Wt 145.0 lb

## 2012-11-13 DIAGNOSIS — R5381 Other malaise: Secondary | ICD-10-CM

## 2012-11-13 DIAGNOSIS — E876 Hypokalemia: Secondary | ICD-10-CM

## 2012-11-13 DIAGNOSIS — R5383 Other fatigue: Secondary | ICD-10-CM

## 2012-11-13 LAB — CBC WITH DIFFERENTIAL/PLATELET
Basophils Absolute: 0 10*3/uL (ref 0.0–0.1)
Hemoglobin: 14.2 g/dL (ref 12.0–15.0)
Lymphocytes Relative: 18.4 % (ref 12.0–46.0)
Monocytes Relative: 9.8 % (ref 3.0–12.0)
Neutro Abs: 5.8 10*3/uL (ref 1.4–7.7)
Neutrophils Relative %: 68.9 % (ref 43.0–77.0)
Platelets: 229 10*3/uL (ref 150.0–400.0)
RDW: 12.9 % (ref 11.5–14.6)

## 2012-11-13 LAB — BASIC METABOLIC PANEL
CO2: 34 mEq/L — ABNORMAL HIGH (ref 19–32)
Chloride: 98 mEq/L (ref 96–112)
Potassium: 3.7 mEq/L (ref 3.5–5.1)
Sodium: 141 mEq/L (ref 135–145)

## 2012-11-13 NOTE — Patient Instructions (Addendum)
Follow up promptly for any fever or increased shortness of breath. 

## 2012-11-13 NOTE — Progress Notes (Signed)
  Subjective:    Patient ID: Catherine [name], female    DOB: 07/01/1935, 78 y.o.   MRN: [id number]  HPI Acute visit Onset cough last week. Productive over the weekend brown mucus. Temp 100 on Saturday.  Occasional chills. Nonsmoker.  Denies any sore throat. No significant nasal congestion.  No body aches.  Increased fatigue for over 2 months. She is concerned because her father had acute leukemia. She's not had any appetite or weight changes.  Patient has history of hypokalemia. Takes high-dose potassium and is also on Maxzide. No recent electrolytes in several months. She has history of atrial fibrillation on Coumadin  Past Medical History  Diagnosis Date  . Hypertension   . Atrial fibrillation, chronic   . Heart murmur   . Arthritis   . UTI (urinary tract infection)   . Chronic kidney disease   . Valvular heart disease     Has mitral stenosis with prior mitral commissurotomy in 1970  . H/O: rheumatic fever   . Chronic anticoagulation    Past Surgical History  Procedure Laterality Date  . Cardiac catheterization    . Back surgery      neurosurgery  . Commissurotomy  1970  . Other surgical history      hysterectomy  . Cataract surg      reports that she has quit smoking. She does not have any smokeless tobacco history on file. She reports that she does not drink alcohol or use illicit drugs. family history includes Leukemia in her father. Allergies  Allergen Reactions  . Ace Inhibitors   . Aldactone (Spironolactone)     dyspnea  . Amoxicillin     tachy  . Benazepril Hcl     cough  . Ciprocin-Fluocin-Procin (Fluocinolone Acetonide)   . Codeine   . Diltiazem     ha  . Flagyl (Metronidazole Hcl)   . Flovent (Fluticasone Propionate)     Leg cramps  . Nitrofurantoin Monohyd Macro     nausea  . Quinidine     Fever diarrhea  . Verapamil     myalgias      Review of Systems  Constitutional: Positive for fatigue. Negative for fever, chills, appetite change and  unexpected weight change.  HENT: Negative for congestion.   Respiratory: Positive for cough and wheezing. Negative for shortness of breath.   Cardiovascular: Negative for chest pain.  Gastrointestinal: Negative for abdominal pain.  Endocrine: Negative for cold intolerance, polydipsia and polyuria.  Genitourinary: Negative for dysuria.  Neurological: Negative for dizziness and headaches.       Objective:   Physical Exam  Constitutional: She appears well-developed and well-nourished.  HENT:  Right Ear: External ear normal.  Left Ear: External ear normal.  Mouth/Throat: Oropharynx is clear and moist.  Neck: Neck supple. No thyromegaly present.  Cardiovascular: Normal rate.   Pulmonary/Chest:  No rales. Symmetric breath sounds. She has some faint diffuse wheezes. No retractions  Musculoskeletal: She exhibits no edema.  Psychiatric: She has a normal mood and affect.          Assessment & Plan:  #1 Cough with wheezing. Suspect acute viral bronchitis with mild reactive airway component. Pulse oximetry 95%. Depo-Medrol 80 mg IM given. Followup promptly for any fever or if not improving next few days #2 fatigue. Check CBC and basic metabolic panel. She has history of hypokalemia.  TSH normal within the past year.

## 2012-11-14 ENCOUNTER — Telehealth: Payer: Self-pay | Admitting: Family Medicine

## 2012-11-14 NOTE — Telephone Encounter (Signed)
Pt would like results of labs done yesterday. Pt is feeling very bad.

## 2012-11-15 ENCOUNTER — Telehealth: Payer: Self-pay | Admitting: Family Medicine

## 2012-11-15 NOTE — Telephone Encounter (Signed)
Called for lab results done 11/13/12 when diagnosed with bronchitis.  Per Dr Caryl Never note on lab results, advised all labs were normal. Reports feeling better today with less shortness of breath, and intermittent cough.  Afebrile.  Had fever 11/24/12 that has been gone for > 24 hours.  Reinforced MD instruction to call if fever or not improving.

## 2012-11-15 NOTE — Progress Notes (Signed)
Quick Note:  Pt informed ______ 

## 2012-11-17 ENCOUNTER — Encounter (HOSPITAL_COMMUNITY): Payer: Self-pay | Admitting: *Deleted

## 2012-11-17 ENCOUNTER — Emergency Department (HOSPITAL_COMMUNITY)
Admission: EM | Admit: 2012-11-17 | Discharge: 2012-11-17 | Disposition: A | Discharge status: Home / Self Care | Payer: Medicare Other | Attending: Emergency Medicine | Admitting: Emergency Medicine

## 2012-11-17 ENCOUNTER — Telehealth: Payer: Self-pay | Admitting: Family Medicine

## 2012-11-17 ENCOUNTER — Emergency Department (HOSPITAL_COMMUNITY): Payer: Medicare Other

## 2012-11-17 DIAGNOSIS — M129 Arthropathy, unspecified: Secondary | ICD-10-CM | POA: Insufficient documentation

## 2012-11-17 DIAGNOSIS — N390 Urinary tract infection, site not specified: Secondary | ICD-10-CM | POA: Insufficient documentation

## 2012-11-17 DIAGNOSIS — Z79899 Other long term (current) drug therapy: Secondary | ICD-10-CM | POA: Insufficient documentation

## 2012-11-17 DIAGNOSIS — Z87891 Personal history of nicotine dependence: Secondary | ICD-10-CM | POA: Insufficient documentation

## 2012-11-17 DIAGNOSIS — Z8679 Personal history of other diseases of the circulatory system: Secondary | ICD-10-CM | POA: Insufficient documentation

## 2012-11-17 DIAGNOSIS — R062 Wheezing: Secondary | ICD-10-CM | POA: Insufficient documentation

## 2012-11-17 DIAGNOSIS — Z7901 Long term (current) use of anticoagulants: Secondary | ICD-10-CM | POA: Insufficient documentation

## 2012-11-17 DIAGNOSIS — Z8619 Personal history of other infectious and parasitic diseases: Secondary | ICD-10-CM | POA: Insufficient documentation

## 2012-11-17 DIAGNOSIS — I129 Hypertensive chronic kidney disease with stage 1 through stage 4 chronic kidney disease, or unspecified chronic kidney disease: Secondary | ICD-10-CM | POA: Insufficient documentation

## 2012-11-17 DIAGNOSIS — J40 Bronchitis, not specified as acute or chronic: Secondary | ICD-10-CM | POA: Insufficient documentation

## 2012-11-17 DIAGNOSIS — N189 Chronic kidney disease, unspecified: Secondary | ICD-10-CM | POA: Insufficient documentation

## 2012-11-17 DIAGNOSIS — R011 Cardiac murmur, unspecified: Secondary | ICD-10-CM | POA: Insufficient documentation

## 2012-11-17 DIAGNOSIS — J069 Acute upper respiratory infection, unspecified: Secondary | ICD-10-CM | POA: Insufficient documentation

## 2012-11-17 DIAGNOSIS — R0602 Shortness of breath: Secondary | ICD-10-CM | POA: Insufficient documentation

## 2012-11-17 DIAGNOSIS — I4891 Unspecified atrial fibrillation: Secondary | ICD-10-CM | POA: Insufficient documentation

## 2012-11-17 MED ORDER — ALBUTEROL SULFATE (5 MG/ML) 0.5% IN NEBU
5.0000 mg | INHALATION_SOLUTION | Freq: Once | RESPIRATORY_TRACT | Status: AC
  Administered 2012-11-17: 5 mg via RESPIRATORY_TRACT
  Filled 2012-11-17: qty 0.5

## 2012-11-17 MED ORDER — IPRATROPIUM BROMIDE 0.02 % IN SOLN
1.0000 mg | Freq: Once | RESPIRATORY_TRACT | Status: AC
  Administered 2012-11-17: 1 mg via RESPIRATORY_TRACT
  Filled 2012-11-17: qty 5

## 2012-11-17 MED ORDER — PREDNISONE 20 MG PO TABS
40.0000 mg | ORAL_TABLET | Freq: Once | ORAL | Status: AC
  Administered 2012-11-17: 40 mg via ORAL
  Filled 2012-11-17: qty 2

## 2012-11-17 MED ORDER — ALBUTEROL SULFATE HFA 108 (90 BASE) MCG/ACT IN AERS: 1.0000 | INHALATION_SPRAY | Freq: Four times a day (QID) | RESPIRATORY_TRACT | Status: DC | PRN

## 2012-11-17 MED ORDER — PREDNISONE 20 MG PO TABS: 40.0000 mg | ORAL_TABLET | Freq: Every day | ORAL | Status: DC

## 2012-11-17 MED ORDER — ALBUTEROL SULFATE (5 MG/ML) 0.5% IN NEBU
10.0000 mg | INHALATION_SOLUTION | Freq: Once | RESPIRATORY_TRACT | Status: AC
  Administered 2012-11-17: 10 mg via RESPIRATORY_TRACT
  Filled 2012-11-17: qty 2

## 2012-11-17 MED ORDER — IPRATROPIUM BROMIDE 0.02 % IN SOLN
0.5000 mg | Freq: Once | RESPIRATORY_TRACT | Status: AC
  Administered 2012-11-17: 0.5 mg via RESPIRATORY_TRACT
  Filled 2012-11-17: qty 2.5

## 2012-11-17 NOTE — ED Provider Notes (Signed)
History     CSN: [id number]  Arrival date & time 11/17/12  1029   First MD Initiated Contact with Patient 11/17/12 1110      Chief Complaint  Patient presents with  . URI    (Consider location/radiation/quality/duration/timing/severity/associated sxs/prior treatment) The history is provided by the patient.  Catherine [name] is a 78 y.o. female should hypertension, A. fib on Coumadin here presenting with cough. Cough for the last 5 days. Productive cough for the last several days. She  Was diagnosed with bronchitis and was given a shot of prednisone 5 days ago. She felt better or 3 days and then subsequently felt worse. Fever 101 for the last 2 days but no fever today. She is not a smoker and does not have COPD or CHF.   Past Medical History  Diagnosis Date  . Hypertension   . Atrial fibrillation, chronic   . Heart murmur   . Arthritis   . UTI (urinary tract infection)   . Chronic kidney disease   . Valvular heart disease     Has mitral stenosis with prior mitral commissurotomy in 1970  . H/O: rheumatic fever   . Chronic anticoagulation     Past Surgical History  Procedure Laterality Date  . Cardiac catheterization    . Back surgery      neurosurgery  . Commissurotomy  1970  . Other surgical history      hysterectomy  . Cataract surg      Family History  Problem Relation Age of Onset  . Leukemia Father     History  Substance Use Topics  . Smoking status: Former Games developer  . Smokeless tobacco: Not on file  . Alcohol Use: No    OB History   Grav Para Term Preterm Abortions TAB SAB Ect Mult Living                  Review of Systems  Respiratory: Positive for cough, shortness of breath and wheezing.   All other systems reviewed and are negative.    Allergies  Ace inhibitors; Aldactone; Benazepril hcl; Ciprocin-fluocin-procin; Ciprofloxacin; Codeine; Diltiazem; Flagyl; Flovent; Gabapentin; Lyrica; Nitrofurantoin monohyd macro; Quinidine; Tramadol; Verapamil;  and Amoxicillin  Home Medications   Current Outpatient Rx  Name  Route  Sig  Dispense  Refill  . B Complex-C (B-COMPLEX WITH VITAMIN C) tablet   Oral   Take 1 tablet by mouth daily.         . diazepam (VALIUM) 5 MG tablet      TAKE (1/2) TO (1) TABLET DAILY AS NEEDED.   30 tablet   2   . digoxin (LANOXIN) 0.125 MG tablet   Oral   Take 1 tablet (0.125 mg total) by mouth daily.   100 tablet   1     NEW DOSE   . furosemide (LASIX) 80 MG tablet   Oral   Take 40 mg by mouth 2 (two) times daily.         . Multiple Vitamin (MULTIVITAMIN) tablet   Oral   Take 1 tablet by mouth daily.           . potassium chloride (K-DUR) 10 MEQ tablet      2 tablets four times a day  (total of 8 per day   720 tablet   3     New dose   . TENORMIN 25 MG tablet      TAKE (1/2) TABLET DAILY.   90 each   3   .  triamterene-hydrochlorothiazide (MAXZIDE-25) 37.5-25 MG per tablet   Oral   Take 1 tablet by mouth daily.         Marland Kitchen warfarin (COUMADIN) 5 MG tablet   Oral   Take 2.5 mg by mouth See admin instructions. Pt takes 2.5 mg (1/2 tablet) Tuesday - Sunday pt doesn't take any Coumadin on Mondays         . Calcium Carbonate-Vit D-Min (SM CALCIUM/VITAMIN D3) 600-800 MG-UNIT TABS   Oral   Take 800 each by mouth daily.   1 tablet   0     Over the counter   . FLUZONE HIGH-DOSE SUSP                 BP 122/63  Pulse 57  Temp(Src) 98.8 F (37.1 C) (Oral)  Resp 17  SpO2 98%  Physical Exam  Nursing note and vitals reviewed. Constitutional: She is oriented to person, place, and time. She appears well-developed and well-nourished.  Slightly tachypneic, talking in full sentences. + coughing   HENT:  Head: Normocephalic.  Mouth/Throat: Oropharynx is clear and moist.  Eyes: Conjunctivae are normal. Pupils are equal, round, and reactive to light.  Neck: Normal range of motion. Neck supple.  Cardiovascular: Normal rate, regular rhythm and normal heart sounds.    Pulmonary/Chest:  Slightly tachypneic. + diffuse wheezing. No retractions.   Abdominal: Soft. Bowel sounds are normal. She exhibits no distension. There is no tenderness. There is no rebound and no guarding.  Musculoskeletal: Normal range of motion.  Neurological: She is alert and oriented to person, place, and time.  Skin: Skin is warm and dry.  Psychiatric: She has a normal mood and affect. Her behavior is normal. Judgment and thought content normal.    ED Course  Procedures (including critical care time)  Labs Reviewed - No data to display Dg Chest 2 View  11/17/2012  *RADIOLOGY REPORT*  Clinical Data: Upper respiratory infection.  Bronchitis.  CHEST - 2 VIEW  Comparison: 12/14/2010.  Findings: Borderline heart size for projection.  No airspace disease.  No effusion.  Stable calcification over the right hemidiaphragm, compatible with benign etiology.  Lower cervical ACDF.  IMPRESSION: No interval change or acute cardiopulmonary disease.   Original Report Authenticated By: Andreas Newport, M.D.      No diagnosis found.    MDM  Catherine [name] is a 78 y.o. female here with SOB, wheezing. Likely worsening bronchitis. Patient is not currently on albuterol at home. Will give 2 nebs and reassess. I discussed a course of prednisone with the patient. She said that she had a bad reaction in the past and would like to try nebs first.   1:20 PM Patient still wheezing. Will give prednisone. She is agreeable now.   3:04 PM Felt better after 3 nebs and prednisone. Less wheezing now, improved air movement. Will d/c home on prednisone, albuterol prn.       Richardean Canal, MD 11/17/12 [phone]

## 2012-11-17 NOTE — ED Notes (Signed)
Spoke with RT. They will be down to start pts. 1 hour neb treatment

## 2012-11-17 NOTE — ED Notes (Signed)
Catherine [name], Toniann Fail ambulated pt to the bathroom  After her breathing treatment.  Pt. Continues to have a tight productive cough.

## 2012-11-17 NOTE — Telephone Encounter (Signed)
Patient Information:  Caller Name: [place]  Phone: [phone]  Patient: Catherine [name], Catherine [name]  Gender: Female  DOB: 1935-07-20  Age: 78 Years  PCP: Evelena Peat (Family [facility])  Office Follow Up:  Does the office need to follow up with this patient?: No  Instructions For The Office: N/A  RN Note:  Decreased appetite. Caller is coughing up yellow phlegm. Pulse 58. Caller is interacting. Caller states her heart is "beating too fast." History of Afib. Caller states she feels "smuthered." Caller is able to speak in sentences however is coughing and has to catch breath. Caller states she feels she might need oxygen.   Symptoms  Reason For Call & Symptoms: Calling about shortness of breath, cough  Reviewed Health History In EMR: Yes  Reviewed Medications In EMR: Yes  Reviewed Allergies In EMR: Yes  Reviewed Surgeries / Procedures: Yes  Date of Onset of Symptoms: 11/16/2012  Guideline(s) Used:  Breathing Difficulty  Disposition Per Guideline:   Go to ED Now  Reason For Disposition Reached:   Extra heart beats OR irregular heart beating (i.e., "palpitations")  Advice Given:  Call Back If:  Severe difficulty breathing occurs  You become worse.

## 2012-11-17 NOTE — ED Notes (Signed)
Pt was seen at her MD and was dx with bronchitis.  Pt was given prednisone.  Pt continues to feel worse.  She has a productive cough and slight wheezing.  Pt reports some sob.  No acute distress

## 2012-11-17 NOTE — ED Notes (Signed)
Pt. Given prednisone and a boxed lunch with a sprite.

## 2012-11-17 NOTE — ED Notes (Signed)
Pt was seen by pcp on Monday and dx with bronchitis. Was given a steroid shot but no medications to take home. Pt's sx have been getting progressively worse. Audible wheezing and SOB. Dr. Silverio Lay at bedside.

## 2012-11-21 ENCOUNTER — Ambulatory Visit (INDEPENDENT_AMBULATORY_CARE_PROVIDER_SITE_OTHER): Payer: Medicare Other | Admitting: Family Medicine

## 2012-11-21 ENCOUNTER — Encounter: Payer: Self-pay | Admitting: Family Medicine

## 2012-11-21 VITALS — BP 104/80 | HR 71 | Temp 98.3°F | Wt 141.0 lb

## 2012-11-21 DIAGNOSIS — J209 Acute bronchitis, unspecified: Secondary | ICD-10-CM

## 2012-11-21 DIAGNOSIS — R350 Frequency of micturition: Secondary | ICD-10-CM

## 2012-11-21 DIAGNOSIS — N760 Acute vaginitis: Secondary | ICD-10-CM

## 2012-11-21 LAB — POCT URINALYSIS DIPSTICK
Glucose, UA: NEGATIVE
Ketones, UA: NEGATIVE
Spec Grav, UA: 1.015
Urobilinogen, UA: 0.2

## 2012-11-21 MED ORDER — FLUCONAZOLE 150 MG PO TABS: 150.0000 mg | ORAL_TABLET | Freq: Once | ORAL | Status: DC

## 2012-11-21 NOTE — Progress Notes (Addendum)
Chief Complaint  Patient presents with  . Vaginitis    HPI:  Acute visit for ? Yeast infection: -recently on prednisone and abx for URI - currently on levaquin and prednisone -thinks has yeast infection - always gets yeast infections after taking abx -having vaginal pressure and itching, a little burning when urinates - monistat helped but didn't resolve symptoms -denies fevers, urinary symptoms, nausea or vomiting -has had yeast infections in the past from taking antibiotics -also has been taking AZO for the symptoms  URI: -doing a lot better s.p abx and steroid -no fevers since last week -no SOB today  ROS: See pertinent positives and negatives per HPI.  Past Medical History  Diagnosis Date  . Hypertension   . Atrial fibrillation, chronic   . Heart murmur   . Arthritis   . UTI (urinary tract infection)   . Chronic kidney disease   . Valvular heart disease     Has mitral stenosis with prior mitral commissurotomy in 1970  . H/O: rheumatic fever   . Chronic anticoagulation     Family History  Problem Relation Age of Onset  . Leukemia Father     History   Social History  . Marital Status: Married    Spouse Name: N/A    Number of Children: N/A  . Years of Education: N/A   Social History Main Topics  . Smoking status: Former Games developer  . Smokeless tobacco: None  . Alcohol Use: No  . Drug Use: No  . Sexually Active: Yes   Other Topics Concern  . None   Social History Narrative  . None    Current outpatient prescriptions:albuterol (PROVENTIL HFA;VENTOLIN HFA) 108 (90 BASE) MCG/ACT inhaler, Inhale 1-2 puffs into the lungs every 6 (six) hours as needed for wheezing., Disp: 1 Inhaler, Rfl: 0;  B Complex-C (B-COMPLEX WITH VITAMIN C) tablet, Take 1 tablet by mouth daily., Disp: , Rfl: ;  Calcium Carbonate-Vit D-Min (SM CALCIUM/VITAMIN D3) 600-800 MG-UNIT TABS, Take 800 each by mouth daily., Disp: 1 tablet, Rfl: 0 diazepam (VALIUM) 5 MG tablet, TAKE (1/2) TO (1)  TABLET DAILY AS NEEDED., Disp: 30 tablet, Rfl: 2;  digoxin (LANOXIN) 0.125 MG tablet, Take 1 tablet (0.125 mg total) by mouth daily., Disp: 100 tablet, Rfl: 1;  FLUZONE HIGH-DOSE SUSP, , Disp: , Rfl: ;  furosemide (LASIX) 80 MG tablet, Take 40 mg by mouth 2 (two) times daily., Disp: , Rfl: ;  Multiple Vitamin (MULTIVITAMIN) tablet, Take 1 tablet by mouth daily.  , Disp: , Rfl:  potassium chloride (K-DUR) 10 MEQ tablet, 2 tablets four times a day  (total of 8 per day, Disp: 720 tablet, Rfl: 3;  predniSONE (DELTASONE) 20 MG tablet, Take 2 tablets (40 mg total) by mouth daily., Disp: 10 tablet, Rfl: 0;  TENORMIN 25 MG tablet, TAKE (1/2) TABLET DAILY., Disp: 90 each, Rfl: 3;  triamterene-hydrochlorothiazide (MAXZIDE-25) 37.5-25 MG per tablet, Take 1 tablet by mouth daily., Disp: , Rfl:  warfarin (COUMADIN) 5 MG tablet, Take 2.5 mg by mouth See admin instructions. Pt takes 2.5 mg (1/2 tablet) Tuesday - Sunday pt doesn't take any Coumadin on Mondays, Disp: , Rfl: ;  fluconazole (DIFLUCAN) 150 MG tablet, Take 1 tablet (150 mg total) by mouth once., Disp: 1 tablet, Rfl: 0  EXAM:  Filed Vitals:   11/21/12 1253  BP: 104/80  Pulse: 71  Temp: 98.3 F (36.8 C)    Body mass index is 22.08 kg/(m^2).  GENERAL: vitals reviewed and listed above, alert, oriented,  appears well hydrated and in no acute distress  HEENT: atraumatic, conjunttiva clear, no obvious abnormalities on inspection of external nose and ears  NECK: no obvious masses on inspection  LUNGS: clear to auscultation bilaterally, no wheezes, rales or rhonchi, good air movement  CV: HRRR, no peripheral edema  ABD: soft, nttp  MS: moves all extremities without noticeable abnormality  PSYCH: pleasant and cooperative, no obvious depression or anxiety  ASSESSMENT AND PLAN:  Discussed the following assessment and plan:  Urinary frequency - Plan: POCT urinalysis dipstick, POCT urinalysis dipstick, Culture, Urine  Vulvovaginitis - Plan:  fluconazole (DIFLUCAN) 150 MG tablet  Acute bronchitis  -likely yeast infection - will tx with diflucan, discussed risks, has follow up in coumadin clinic next week -urine dip with nitritie and tr leuks - but on azo - will get culture, doubt UTI given on abx -bronchitis is improving - complete medicaitons -Patient advised to return or notify a doctor immediately if symptoms worsen or persist or new concerns arise.  There are no Patient Instructions on file for this visit.   Kriste Basque R.

## 2012-11-22 ENCOUNTER — Ambulatory Visit
Admission: RE | Admit: 2012-11-22 | Discharge: 2012-11-22 | Disposition: A | Discharge status: Home / Self Care | Payer: Medicare Other | Source: Ambulatory Visit | Attending: Cardiology | Admitting: Cardiology

## 2012-11-22 DIAGNOSIS — Z1231 Encounter for screening mammogram for malignant neoplasm of breast: Secondary | ICD-10-CM

## 2012-11-23 ENCOUNTER — Telehealth: Payer: Self-pay | Admitting: Family Medicine

## 2012-11-23 DIAGNOSIS — N390 Urinary tract infection, site not specified: Secondary | ICD-10-CM

## 2012-11-23 DIAGNOSIS — N760 Acute vaginitis: Secondary | ICD-10-CM

## 2012-11-23 LAB — URINE CULTURE

## 2012-11-23 MED ORDER — LEVOFLOXACIN 750 MG PO TABS: 750.0000 mg | ORAL_TABLET | Freq: Every day | ORAL | Status: DC

## 2012-11-23 MED ORDER — FLUCONAZOLE 150 MG PO TABS: 150.0000 mg | ORAL_TABLET | Freq: Once | ORAL | Status: DC

## 2012-11-23 MED ORDER — CIPROFLOXACIN HCL 500 MG PO TABS: 500.0000 mg | ORAL_TABLET | Freq: Two times a day (BID) | ORAL | Status: DC

## 2012-11-23 NOTE — Telephone Encounter (Signed)
Please advise her - there urine culture final report came back this afternoon and shows a small amount of bacteria in the urine that may be a partially treated urinary tract infection. Given your ongoing symptoms I would advise another antibiotic and the culture shows this should treat the bacteria. This can impact your coumadin so I will send this note to padonda and she should be seen in the coumadin clinic next week. Thanks.

## 2012-11-23 NOTE — Addendum Note (Signed)
Addended by: Terressa Koyanagi on: 11/23/2012 11:59 AM   Modules accepted: Orders

## 2012-11-23 NOTE — Addendum Note (Signed)
Addended by: Azucena Freed on: 11/23/2012 05:04 PM   Modules accepted: Orders

## 2012-11-23 NOTE — Telephone Encounter (Signed)
She should not be taking that much of the azo. Another yeast pill sent to the pharmacy - it is to be taken one week after the first tablet if symptoms not resolved by then. She should drink lots of water. If continued symptoms in a week or if worsening should be seen for repeat urine test and vaginal exam.

## 2012-11-23 NOTE — Telephone Encounter (Signed)
Called and spoke with pt and pt is aware.  

## 2012-11-23 NOTE — Telephone Encounter (Signed)
Rx sent to [facility].  

## 2012-11-23 NOTE — Telephone Encounter (Signed)
Called and spoke with pt and pt states she is not better. Pt would like to have a rx for another pill for the yeast infection.  PT states she is still taking the AZO for pain because it hurts to use the bathroom.  Pt states she is taking 6 of those pills a day. Pt would like the rx sent to [facility].  Pls adivse.

## 2012-11-23 NOTE — Telephone Encounter (Signed)
pls advise

## 2012-11-23 NOTE — Telephone Encounter (Signed)
Urine culture did not show overgrowth to suggest urinary tract infection. I hope she is feeling better.

## 2012-11-23 NOTE — Addendum Note (Signed)
Addended by: Azucena Freed on: 11/23/2012 01:20 PM   Modules accepted: Orders

## 2012-11-23 NOTE — Telephone Encounter (Signed)
Called and spoke with pt and pt is aware.  Pt refused cipro so per Dr. Selena Batten levoquin called in to pharmacy.

## 2012-11-23 NOTE — Telephone Encounter (Signed)
Called and spoke with pt and pt is aware.  Advised pt to take second pill on Saturday.  Advised to not use soap and to use water only.  Pt is aware.

## 2012-11-28 ENCOUNTER — Ambulatory Visit: Payer: Medicare Other | Admitting: Family Medicine

## 2012-11-28 ENCOUNTER — Encounter: Payer: Medicare Other | Admitting: Family

## 2012-12-01 ENCOUNTER — Ambulatory Visit: Payer: Medicare Other | Admitting: Family Medicine

## 2012-12-01 ENCOUNTER — Encounter: Payer: Medicare Other | Admitting: Family

## 2012-12-05 ENCOUNTER — Ambulatory Visit: Payer: Medicare Other | Admitting: Family Medicine

## 2012-12-05 ENCOUNTER — Ambulatory Visit (INDEPENDENT_AMBULATORY_CARE_PROVIDER_SITE_OTHER): Payer: Medicare Other | Admitting: Family Medicine

## 2012-12-05 ENCOUNTER — Ambulatory Visit (INDEPENDENT_AMBULATORY_CARE_PROVIDER_SITE_OTHER): Payer: Medicare Other | Admitting: Family

## 2012-12-05 ENCOUNTER — Encounter: Payer: Self-pay | Admitting: Family Medicine

## 2012-12-05 VITALS — BP 130/80 | Temp 98.0°F

## 2012-12-05 DIAGNOSIS — I4891 Unspecified atrial fibrillation: Secondary | ICD-10-CM

## 2012-12-05 DIAGNOSIS — J209 Acute bronchitis, unspecified: Secondary | ICD-10-CM

## 2012-12-05 DIAGNOSIS — Z7901 Long term (current) use of anticoagulants: Secondary | ICD-10-CM

## 2012-12-05 LAB — POCT URINALYSIS DIPSTICK
Blood, UA: NEGATIVE
Ketones, UA: NEGATIVE
Leukocytes, UA: NEGATIVE
Protein, UA: NEGATIVE
pH, UA: 5

## 2012-12-05 NOTE — Patient Instructions (Addendum)
Follow up for any recurrent cough, wheezing, or shortness of breath.

## 2012-12-05 NOTE — Progress Notes (Signed)
  Subjective:    Patient ID: Catherine [name], female    DOB: 02-Jan-1935, 78 y.o.   MRN: [id number]  HPI Emergency room followup. Patient been seen here with acute bronchitis. She was given Depo-Medrol but continued to progress with cough and shortness of breath Went to emergency room and chest x-ray normal. Given nebulizer and oral prednisone Now back to baseline. No residual cough. No dyspnea. No fever.  INR today 4.8 and she will hold Coumadin today and tomorrow No bleeding complications and patient has been given written instructions   Review of Systems  Constitutional: Negative for fever, chills and unexpected weight change.  Respiratory: Negative for cough, shortness of breath and wheezing.   Cardiovascular: Negative for chest pain.  Hematological: Does not bruise/bleed easily.       Objective:   Physical Exam  Constitutional: She appears well-developed and well-nourished.  Neck: Neck supple. No thyromegaly present.  Cardiovascular: Normal rate and regular rhythm.   Pulmonary/Chest: Effort normal and breath sounds normal. No respiratory distress. She has no wheezes. She has no rales.  Musculoskeletal: She exhibits no edema.  Lymphadenopathy:    She has no cervical adenopathy.          Assessment & Plan:  Acute bronchitis with reactive airway component resolved. Reassurance. Patient has been given written instructions for Coumadin management

## 2012-12-05 NOTE — Patient Instructions (Addendum)
Hold Coumadin Tuesday and Wednesday. Eat a few more servings of greens over the next couple days. Return in 2 weeks.   Anticoagulation Dose Instructions as of 12/05/2012     Catherine [name] Tue Wed Thu Fri Sat   New Dose 2.5 mg 0 mg 2.5 mg 2.5 mg 2.5 mg 0 mg 2.5 mg    Description       Hold Coumadin Tuesday and Wednesday. Eat a few more servings of greens over the next couple days. Return in 2 weeks.

## 2012-12-11 LAB — POCT INR: INR: 1.8

## 2012-12-18 ENCOUNTER — Ambulatory Visit (INDEPENDENT_AMBULATORY_CARE_PROVIDER_SITE_OTHER): Payer: Medicare Other | Admitting: Family

## 2012-12-18 DIAGNOSIS — I4891 Unspecified atrial fibrillation: Secondary | ICD-10-CM

## 2012-12-18 DIAGNOSIS — Z7901 Long term (current) use of anticoagulants: Secondary | ICD-10-CM

## 2012-12-18 NOTE — Patient Instructions (Addendum)
Take and extra 1/2 tablet today only. Eat a few more servings of greens over the next couple days. Return in 3 weeks.   Anticoagulation Dose Instructions as of 12/18/2012     Glynis Smiles Tue Wed Thu Fri Sat   New Dose 2.5 mg 0 mg 2.5 mg 2.5 mg 2.5 mg 0 mg 2.5 mg    Description       Take and extra 1/2 tablet today only. Eat a few more servings of greens over the next couple days. Return in 3 weeks.

## 2012-12-19 ENCOUNTER — Encounter: Payer: Medicare Other | Admitting: Family

## 2013-01-08 ENCOUNTER — Ambulatory Visit (INDEPENDENT_AMBULATORY_CARE_PROVIDER_SITE_OTHER): Payer: Medicare Other | Admitting: Family

## 2013-01-08 DIAGNOSIS — Z7901 Long term (current) use of anticoagulants: Secondary | ICD-10-CM

## 2013-01-08 DIAGNOSIS — I4891 Unspecified atrial fibrillation: Secondary | ICD-10-CM

## 2013-01-08 NOTE — Patient Instructions (Addendum)
Continue same dosage. Recheck in 4 weeks  Anticoagulation Dose Instructions as of 01/08/2013     Catherine [name] Tue Wed Thu Fri Sat   New Dose 2.5 mg 0 mg 2.5 mg 2.5 mg 2.5 mg 2.5 mg 2.5 mg    Description       Continue same dosage. Recheck in 4 weeks

## 2013-01-09 ENCOUNTER — Other Ambulatory Visit: Payer: Self-pay | Admitting: Family Medicine

## 2013-01-09 NOTE — Telephone Encounter (Signed)
Valium 1/2 to 1 tab daily prn last filled 09-23-12, #30 with 2 refills

## 2013-01-09 NOTE — Telephone Encounter (Signed)
Refill for 3 months. 

## 2013-01-11 ENCOUNTER — Other Ambulatory Visit: Payer: Self-pay | Admitting: Family

## 2013-01-30 ENCOUNTER — Other Ambulatory Visit: Payer: Self-pay | Admitting: *Deleted

## 2013-01-30 DIAGNOSIS — E876 Hypokalemia: Secondary | ICD-10-CM

## 2013-01-30 MED ORDER — POTASSIUM CHLORIDE ER 10 MEQ PO TBCR: EXTENDED_RELEASE_TABLET | ORAL | Status: DC

## 2013-01-30 MED ORDER — TRIAMTERENE-HCTZ 37.5-25 MG PO TABS: 1.0000 | ORAL_TABLET | Freq: Every day | ORAL | Status: DC

## 2013-02-01 ENCOUNTER — Other Ambulatory Visit: Payer: Self-pay | Admitting: *Deleted

## 2013-02-01 MED ORDER — ATENOLOL 25 MG PO TABS: 12.5000 mg | ORAL_TABLET | Freq: Every day | ORAL | Status: DC

## 2013-02-02 ENCOUNTER — Encounter: Payer: Medicare Other | Admitting: Family

## 2013-02-02 ENCOUNTER — Encounter: Payer: Self-pay | Admitting: Family Medicine

## 2013-02-02 ENCOUNTER — Ambulatory Visit (INDEPENDENT_AMBULATORY_CARE_PROVIDER_SITE_OTHER): Payer: Medicare Other | Admitting: Family

## 2013-02-02 ENCOUNTER — Ambulatory Visit (INDEPENDENT_AMBULATORY_CARE_PROVIDER_SITE_OTHER): Payer: Medicare Other | Admitting: Family Medicine

## 2013-02-02 VITALS — BP 130/80 | Temp 99.5°F | Wt 145.0 lb

## 2013-02-02 DIAGNOSIS — Z7901 Long term (current) use of anticoagulants: Secondary | ICD-10-CM

## 2013-02-02 DIAGNOSIS — R102 Pelvic and perineal pain: Secondary | ICD-10-CM

## 2013-02-02 DIAGNOSIS — I4891 Unspecified atrial fibrillation: Secondary | ICD-10-CM

## 2013-02-02 DIAGNOSIS — N949 Unspecified condition associated with female genital organs and menstrual cycle: Secondary | ICD-10-CM

## 2013-02-02 DIAGNOSIS — R300 Dysuria: Secondary | ICD-10-CM

## 2013-02-02 LAB — POCT URINALYSIS DIPSTICK
Bilirubin, UA: NEGATIVE
Glucose, UA: NEGATIVE
Ketones, UA: NEGATIVE
Leukocytes, UA: NEGATIVE
Nitrite, UA: NEGATIVE

## 2013-02-02 MED ORDER — FLUCONAZOLE 150 MG PO TABS: 150.0000 mg | ORAL_TABLET | Freq: Once | ORAL | Status: DC

## 2013-02-02 MED ORDER — LEVOFLOXACIN 500 MG PO TABS: 500.0000 mg | ORAL_TABLET | Freq: Every day | ORAL | Status: DC

## 2013-02-02 NOTE — Patient Instructions (Addendum)
Follow up promptly for any vaginal bleeding or any increased pain or other new symptoms.

## 2013-02-02 NOTE — Patient Instructions (Addendum)
Continue same dosage. Recheck in 4 weeks  Anticoagulation Dose Instructions as of 02/02/2013     Catherine [name] Tue Wed Thu Fri Sat   New Dose 2.5 mg 0 mg 2.5 mg 2.5 mg 2.5 mg 2.5 mg 2.5 mg    Description       Continue same dosage. Recheck in 4 weeks

## 2013-02-02 NOTE — Progress Notes (Signed)
  Subjective:    Patient ID: Catherine [name], female    DOB: [birth year], 78 y.o.   MRN: [id number]  HPI Patient seen with intravaginal discomfort off-and-on for a least one year. Previous total abdominal hysterectomy 1983 secondary to endometriosis She has not had any vaginal spotting or vaginal discharge.. No significant dysuria. Not sexually active in several years Occasional lower abdominal bloating. No recent stool changes.  Colonoscopy up to date. Denies any appetite or weight changes.  She has some mild burning with urination.  No fever or chills.  Past Medical History  Diagnosis Date  . Hypertension   . Atrial fibrillation, chronic   . Heart murmur   . Arthritis   . UTI (urinary tract infection)   . Chronic kidney disease   . Valvular heart disease     Has mitral stenosis with prior mitral commissurotomy in 1970  . H/O: rheumatic fever   . Chronic anticoagulation    Past Surgical History  Procedure Laterality Date  . Cardiac catheterization    . Back surgery      neurosurgery  . Commissurotomy  1970  . Other surgical history      hysterectomy  . Cataract surg    . Abdominal hysterectomy  1983    endometriosis    reports that she has quit smoking. She does not have any smokeless tobacco history on file. She reports that she does not drink alcohol or use illicit drugs. family history includes Leukemia in her father. Allergies  Allergen Reactions  . Ace Inhibitors   . Aldactone (Spironolactone)     dyspnea  . Benazepril Hcl     cough  . Ciprocin-Fluocin-Procin (Fluocinolone Acetonide)   . Ciprofloxacin Diarrhea  . Codeine Nausea Only  . Diltiazem     ha  . Flagyl (Metronidazole Hcl) Other (See Comments)    headache  . Flovent (Fluticasone Propionate)     Leg cramps  . Gabapentin Swelling  . Lyrica (Pregabalin) Swelling  . Nitrofurantoin Monohyd Macro     nausea  . Quinidine     Fever diarrhea  . Tramadol Nausea Only  . Verapamil     myalgias  .  Amoxicillin Palpitations    tachy      Review of Systems  Constitutional: Negative for fever, chills, appetite change and unexpected weight change.  Respiratory: Negative for cough.   Cardiovascular: Negative for chest pain.  Gastrointestinal: Negative for nausea, vomiting, diarrhea and blood in stool.  Genitourinary: Positive for dysuria and vaginal pain. Negative for hematuria, flank pain, vaginal bleeding, vaginal discharge and genital sores.       Objective:   Physical Exam  Constitutional: She appears well-developed and well-nourished.  Neck: Neck supple.  Cardiovascular: Normal rate and regular rhythm.   Pulmonary/Chest: Effort normal and breath sounds normal. No respiratory distress. She has no wheezes. She has no rales.  Genitourinary: Vagina normal. No vaginal discharge found.  Pelvic exam.  Normal external genitalia.  Only mild mucosal atrophy. No lesions.  No vaginal bleeding.  Surgically absent cervix and uterus.  Lymphadenopathy:    She has no cervical adenopathy.          Assessment & Plan:  Intravaginal pain.  ?atrophic vaginal discomfort.  No lesions seen.  Discussed topical estrogen and she is not interested. She will try lubricant such as K-Y jelly.  Mild dysuria.  Urine dip normal.  Reassurance.

## 2013-02-27 ENCOUNTER — Ambulatory Visit (INDEPENDENT_AMBULATORY_CARE_PROVIDER_SITE_OTHER): Payer: Medicare Other | Admitting: Family

## 2013-02-27 DIAGNOSIS — I4891 Unspecified atrial fibrillation: Secondary | ICD-10-CM

## 2013-02-27 DIAGNOSIS — M25579 Pain in unspecified ankle and joints of unspecified foot: Secondary | ICD-10-CM

## 2013-02-27 DIAGNOSIS — M25572 Pain in left ankle and joints of left foot: Secondary | ICD-10-CM

## 2013-02-27 DIAGNOSIS — Z7901 Long term (current) use of anticoagulants: Secondary | ICD-10-CM

## 2013-02-27 LAB — POCT INR: INR: 3.1

## 2013-02-27 LAB — URIC ACID: Uric Acid, Serum: 5.4 mg/dL (ref 2.4–7.0)

## 2013-02-27 NOTE — Patient Instructions (Addendum)
Hold Coumadin Today. Continue same dosage. Recheck in 4 weeks Anticoagulation Dose Instructions as of 02/27/2013     Glynis Smiles Tue Wed Thu Fri Sat   New Dose 2.5 mg 0 mg 2.5 mg 2.5 mg 2.5 mg 2.5 mg 2.5 mg    Description       Hold Coumadin Today. Continue same dosage. Recheck in 4 weeks

## 2013-02-28 ENCOUNTER — Telehealth: Payer: Self-pay | Admitting: Family

## 2013-02-28 NOTE — Telephone Encounter (Signed)
Pt aware of results 

## 2013-02-28 NOTE — Telephone Encounter (Signed)
Pt received call late yesterday ?uric acid level results. Pt saw NP yesterday

## 2013-03-06 ENCOUNTER — Encounter: Payer: Self-pay | Admitting: Cardiology

## 2013-03-06 ENCOUNTER — Ambulatory Visit (INDEPENDENT_AMBULATORY_CARE_PROVIDER_SITE_OTHER): Payer: Medicare Other | Admitting: Cardiology

## 2013-03-06 VITALS — BP 126/78 | HR 62 | Ht 63.0 in | Wt 145.1 lb

## 2013-03-06 DIAGNOSIS — E7800 Pure hypercholesterolemia, unspecified: Secondary | ICD-10-CM

## 2013-03-06 DIAGNOSIS — Z79899 Other long term (current) drug therapy: Secondary | ICD-10-CM

## 2013-03-06 DIAGNOSIS — I4891 Unspecified atrial fibrillation: Secondary | ICD-10-CM

## 2013-03-06 DIAGNOSIS — I38 Endocarditis, valve unspecified: Secondary | ICD-10-CM

## 2013-03-06 LAB — CBC WITH DIFFERENTIAL/PLATELET
Basophils Absolute: 0 10*3/uL (ref 0.0–0.1)
Basophils Relative: 0.5 % (ref 0.0–3.0)
Eosinophils Absolute: 0.2 10*3/uL (ref 0.0–0.7)
Hemoglobin: 13.9 g/dL (ref 12.0–15.0)
Lymphocytes Relative: 25 % (ref 12.0–46.0)
Monocytes Relative: 7 % (ref 3.0–12.0)
Neutro Abs: 5.9 10*3/uL (ref 1.4–7.7)
Neutrophils Relative %: 65.8 % (ref 43.0–77.0)
RBC: 4.36 Mil/uL (ref 3.87–5.11)
RDW: 12.6 % (ref 11.5–14.6)

## 2013-03-06 LAB — HEPATIC FUNCTION PANEL
ALT: 23 U/L (ref 0–35)
AST: 29 U/L (ref 0–37)
Albumin: 4.2 g/dL (ref 3.5–5.2)
Alkaline Phosphatase: 61 U/L (ref 39–117)

## 2013-03-06 LAB — BASIC METABOLIC PANEL
Calcium: 9.4 mg/dL (ref 8.4–10.5)
Creatinine, Ser: 0.8 mg/dL (ref 0.4–1.2)
GFR: 74.19 mL/min (ref >60.00)
Sodium: 143 mEq/L (ref 135–145)

## 2013-03-06 LAB — LIPID PANEL
Cholesterol: 218 mg/dL — ABNORMAL HIGH (ref 0–200)
Triglycerides: 213 mg/dL — ABNORMAL HIGH (ref 0.0–149.0)

## 2013-03-06 NOTE — Progress Notes (Signed)
Catherine [name] Date of Birth:  [birth year] [facility] [address] [place], [place]  [address] [phone]         Fax   [phone]  History of Present Illness: This pleasant 78 year old woman is seen for a scheduled followup office visit. She has a past history of rheumatic fever and rheumatic heart disease. She has a history of mitral stenosis. In 1970s she underwent mitral commissurotomy by Dr. Micah Noel her last echocardiogram was in 2011 and showed a normal ejection fraction of 55-60% biatrial enlargement, mild aortic stenosis, mild mitral stenosis with moderate to severe mitral regurgitation, and mild tricuspid regurgitation with normal pulmonary artery pressure. Her mitral valve area was 1.9. She is in chronic established atrial fibrillation. She is on long-term Coumadin. Since last visit she has been doing well.  She continues to complain of lack of enough energy.  She stays physically active however.  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  Current Outpatient Prescriptions  Medication Sig Dispense Refill  . atenolol (TENORMIN) 25 MG tablet Take 0.5 tablets (12.5 mg total) by mouth daily.  90 tablet  0  . B Complex-C (B-COMPLEX WITH VITAMIN C) tablet Take 1 tablet by mouth daily.      . calcium-vitamin D (OSCAL WITH D) 250-125 MG-UNIT per tablet Take 1 tablet by mouth 2 (two) times daily.      . diazepam (VALIUM) 5 MG tablet TAKE 1/2 TO 1 TABLET ONCE DAILY AS NEEDED  30 tablet  2  . digoxin (LANOXIN) 0.125 MG tablet Take 1 tablet (0.125 mg total) by mouth daily.  100 tablet  1  . furosemide (LASIX) 80 MG tablet Take 40 mg by mouth 2 (two) times daily.      Marland Kitchen levofloxacin (LEVAQUIN) 500 MG tablet Take 1 tablet (500 mg total) by mouth daily.  7 tablet  0  . Multiple Vitamin (MULTIVITAMIN) tablet Take 1 tablet by mouth daily.        . potassium chloride (K-DUR) 10 MEQ tablet 2 tablets four times a day  (total of 8 per day  720 tablet  3  .  triamterene-hydrochlorothiazide (MAXZIDE-25) 37.5-25 MG per tablet Take 1 tablet by mouth daily.  90 tablet  3  . warfarin (COUMADIN) 5 MG tablet Take 1 tablet (5 mg total) by mouth daily.  90 tablet  1   No current facility-administered medications for this visit.    Allergies  Allergen Reactions  . Ace Inhibitors   . Aldactone (Spironolactone)     dyspnea  . Benazepril Hcl     cough  . Ciprocin-Fluocin-Procin (Fluocinolone Acetonide)   . Ciprofloxacin Diarrhea  . Codeine Nausea Only  . Diltiazem     ha  . Flagyl (Metronidazole Hcl) Other (See Comments)    headache  . Flovent (Fluticasone Propionate)     Leg cramps  . Gabapentin Swelling  . Lyrica (Pregabalin) Swelling  . Nitrofurantoin Monohyd Macro     nausea  . Quinidine     Fever diarrhea  . Tramadol Nausea Only  . Verapamil     myalgias  . Amoxicillin Palpitations    tachy    Patient Active Problem List   Diagnosis Date Noted  . Osteoarthritis 08/11/2012  . Osteoporosis 06/06/2012  . Encounter for long-term (current) use of anticoagulants 07/21/2011  . Benign hypertensive heart disease without heart failure 04/28/2011  . Hypercholesterolemia 04/28/2011  . Low back pain 01/27/2011  . Atrial fibrillation   .  COPD 01/30/2008  . RHEUMATIC MITRAL STENOSIS 01/05/2008  . RHEUMATIC HEART DISEASE 01/05/2008  . VALVULAR HEART DISEASE 01/05/2008  . Atrial fibrillation 01/05/2008  . DYSPNEA 01/04/2008  . COUGH 01/04/2008    History  Smoking status  . Former Smoker  Smokeless tobacco  . Not on file    History  Alcohol Use No    Family History  Problem Relation Age of Onset  . Leukemia Father     Review of Systems: Constitutional: no fever chills diaphoresis or fatigue or change in weight.  Head and neck: no hearing loss, no epistaxis, no photophobia or visual disturbance. Respiratory: No cough, shortness of breath or wheezing. Cardiovascular: No chest pain peripheral edema,  palpitations. Gastrointestinal: No abdominal distention, no abdominal pain, no change in bowel habits hematochezia or melena. Genitourinary: No dysuria, no frequency, no urgency, no nocturia. Musculoskeletal:No arthralgias, no back pain, no gait disturbance or myalgias. Neurological: No dizziness, no headaches, no numbness, no seizures, no syncope, no weakness, no tremors. Hematologic: No lymphadenopathy, no easy bruising. Psychiatric: No confusion, no hallucinations, no sleep disturbance.    Physical Exam: Filed Vitals:   03/06/13 1009  BP: 126/78  Pulse: 62   general appearance reveals a healthy-appearing woman in no distress.The head and neck exam reveals pupils equal and reactive.  Extraocular movements are full.  There is no scleral icterus.  The mouth and pharynx are normal.  The neck is supple.  The carotids reveal no bruits.  The jugular venous pressure is normal.  The  thyroid is not enlarged.  There is no lymphadenopathy.  The chest is clear to percussion and auscultation.  There are no rales or rhonchi.  Expansion of the chest is symmetrical.  The precordium is quiet.  The first heart sound is normal.  The second heart sound is physiologically split.  There is  soft apical diastolic rumble  There is no abnormal lift or heave.  The abdomen is soft and nontender.  The bowel sounds are normal.  The liver and spleen are not enlarged.  There are no abdominal masses.  There are no abdominal bruits.  Extremities reveal good pedal pulses.  There is no phlebitis or edema.  There is no cyanosis or clubbing.  Strength is normal and symmetrical in all extremities.  There is no lateralizing weakness.  There are no sensory deficits.  The skin is warm and dry.  There is no rash.  EKG shows atrial fibrillation and nonspecific ST-T wave changes   Assessment / Plan: Continue same medication.  Await results of today's labs.  Recheck in 6 months for office visit CBC lipid panel hepatic function panel and  basal metabolic panel.

## 2013-03-06 NOTE — Assessment & Plan Note (Signed)
The patient has a history of hypercholesterolemia.  Since last visit she has cut out ice cream and most of her cheese intake.  She is not on statin therapy.  We are checking fasting labs today.

## 2013-03-06 NOTE — Assessment & Plan Note (Signed)
The patient has not been experiencing any orthopnea or paroxysmal nocturnal dyspnea 

## 2013-03-06 NOTE — Patient Instructions (Addendum)
Will obtain labs today and call you with the results (lp/bmet/hfp/cbc)  Your physician recommends that you continue on your current medications as directed. Please refer to the Current Medication list given to you today.  Your physician wants you to follow-up in: 6 months with fasting labs (lp/bmet/hfp/cbc) You will receive a reminder letter in the mail two months in advance. If you don't receive a letter, please call our office to schedule the follow-up appointment.  

## 2013-03-06 NOTE — Assessment & Plan Note (Signed)
Patient remains on Coumadin.  She has not been having any TIA symptoms.  She is in permanent atrial fibrillation

## 2013-03-07 ENCOUNTER — Other Ambulatory Visit: Payer: Medicare Other

## 2013-03-08 ENCOUNTER — Telehealth: Payer: Self-pay | Admitting: *Deleted

## 2013-03-08 DIAGNOSIS — E876 Hypokalemia: Secondary | ICD-10-CM

## 2013-03-08 MED ORDER — POTASSIUM CHLORIDE ER 10 MEQ PO TBCR: EXTENDED_RELEASE_TABLET | ORAL | Status: DC

## 2013-03-08 NOTE — Telephone Encounter (Signed)
Patient taking medications as directed. Discussed with  Dr. Patty Sermons and will decrease her furosemide to 40 mg daily, increase K+ 10 meq to 10 tablets daily, and will continue Maxzide for now. Patient has decreased Maxzide to 1/2 tablet daily in the past and had increased shortness of breath. Advised patient, verbalized understanding. Will recheck labs in 1 week

## 2013-03-08 NOTE — Telephone Encounter (Signed)
Message copied by Burnell Blanks on Thu Mar 08, 2013  5:55 PM ------      Message from: Cassell Clement      Created: Wed Mar 07, 2013  9:43 PM       Potassium 2.9 very low. This can cause her fatigue as well as cramps in muscles etc. Is she taking her potassium as directed?? ------

## 2013-03-14 ENCOUNTER — Other Ambulatory Visit (INDEPENDENT_AMBULATORY_CARE_PROVIDER_SITE_OTHER): Payer: Medicare Other

## 2013-03-14 DIAGNOSIS — E876 Hypokalemia: Secondary | ICD-10-CM

## 2013-03-14 LAB — BASIC METABOLIC PANEL
BUN: 16 mg/dL (ref 6–23)
Chloride: 99 mEq/L (ref 96–112)
GFR: 63.14 mL/min (ref >60.00)
Potassium: 4.4 mEq/L (ref 3.5–5.1)
Sodium: 139 mEq/L (ref 135–145)

## 2013-03-14 NOTE — Progress Notes (Signed)
Quick Note:  Please report to patient. The recent labs are stable. Continue same medication and careful diet. Potassium has improved. Continue same dose. ______

## 2013-03-27 ENCOUNTER — Ambulatory Visit (INDEPENDENT_AMBULATORY_CARE_PROVIDER_SITE_OTHER): Payer: Medicare Other | Admitting: Family

## 2013-03-27 DIAGNOSIS — Z7901 Long term (current) use of anticoagulants: Secondary | ICD-10-CM

## 2013-03-27 DIAGNOSIS — I4891 Unspecified atrial fibrillation: Secondary | ICD-10-CM

## 2013-03-27 LAB — POCT INR: INR: 2.2

## 2013-03-27 NOTE — Patient Instructions (Addendum)
Continue 1/2 tablet everyday and no tablet on Monday. Recheck in 6 weeks  Anticoagulation Dose Instructions as of 03/27/2013     Catherine [name] Tue Wed Thu Fri Sat   New Dose 2.5 mg 0 mg 2.5 mg 2.5 mg 2.5 mg 2.5 mg 2.5 mg    Description        Continue 1/2 tablet everyday and no tablet on Monday. Recheck in 6 weeks

## 2013-04-16 ENCOUNTER — Other Ambulatory Visit: Payer: Self-pay | Admitting: Family Medicine

## 2013-04-16 NOTE — Telephone Encounter (Signed)
Last refill on 01/09/13 #30 2 refills

## 2013-04-16 NOTE — Telephone Encounter (Signed)
Refill for 6 months. 

## 2013-04-17 NOTE — Telephone Encounter (Signed)
Refill phone in

## 2013-05-07 ENCOUNTER — Ambulatory Visit (INDEPENDENT_AMBULATORY_CARE_PROVIDER_SITE_OTHER): Payer: Medicare Other | Admitting: General Practice

## 2013-05-07 ENCOUNTER — Ambulatory Visit: Payer: Medicare Other

## 2013-05-07 DIAGNOSIS — I4891 Unspecified atrial fibrillation: Secondary | ICD-10-CM

## 2013-05-07 DIAGNOSIS — Z7901 Long term (current) use of anticoagulants: Secondary | ICD-10-CM

## 2013-05-08 ENCOUNTER — Encounter: Payer: Medicare Other | Admitting: Family

## 2013-06-05 ENCOUNTER — Telehealth: Payer: Self-pay | Admitting: *Deleted

## 2013-06-05 MED ORDER — POTASSIUM CHLORIDE ER 10 MEQ PO TBCR: EXTENDED_RELEASE_TABLET | ORAL | Status: DC

## 2013-06-05 NOTE — Telephone Encounter (Signed)
Request to change k-dur to klor con which is a smaller tablet for patient to take, Dr Patty Sermons approved this and I call pharmacy to inform them, will change on medicine record

## 2013-06-18 ENCOUNTER — Ambulatory Visit (INDEPENDENT_AMBULATORY_CARE_PROVIDER_SITE_OTHER): Payer: Medicare Other | Admitting: General Practice

## 2013-06-18 DIAGNOSIS — Z7901 Long term (current) use of anticoagulants: Secondary | ICD-10-CM

## 2013-06-18 DIAGNOSIS — I4891 Unspecified atrial fibrillation: Secondary | ICD-10-CM

## 2013-06-18 LAB — POCT INR: INR: 2.8

## 2013-07-02 ENCOUNTER — Other Ambulatory Visit: Payer: Self-pay | Admitting: Cardiology

## 2013-07-18 ENCOUNTER — Telehealth: Payer: Self-pay

## 2013-07-18 MED ORDER — DIAZEPAM 5 MG PO TABS: ORAL_TABLET | ORAL | Status: DC

## 2013-07-18 NOTE — Telephone Encounter (Signed)
Refill for 3 months. 

## 2013-07-18 NOTE — Telephone Encounter (Signed)
Phoned in RX

## 2013-07-18 NOTE — Telephone Encounter (Signed)
Diazepam 5mg   Last refill 04/16/13 #30 2 refill Last visit 02/02/13  [facility] pharmacy

## 2013-07-27 ENCOUNTER — Ambulatory Visit (INDEPENDENT_AMBULATORY_CARE_PROVIDER_SITE_OTHER): Payer: Medicare Other | Admitting: Family Medicine

## 2013-07-27 ENCOUNTER — Encounter: Payer: Self-pay | Admitting: Family Medicine

## 2013-07-27 ENCOUNTER — Ambulatory Visit (INDEPENDENT_AMBULATORY_CARE_PROVIDER_SITE_OTHER): Payer: Medicare Other | Admitting: Family

## 2013-07-27 VITALS — BP 130/62 | HR 76 | Temp 100.4°F | Wt 144.0 lb

## 2013-07-27 DIAGNOSIS — J209 Acute bronchitis, unspecified: Secondary | ICD-10-CM

## 2013-07-27 DIAGNOSIS — Z7901 Long term (current) use of anticoagulants: Secondary | ICD-10-CM

## 2013-07-27 DIAGNOSIS — I4891 Unspecified atrial fibrillation: Secondary | ICD-10-CM

## 2013-07-27 LAB — POCT INR: INR: 2.5

## 2013-07-27 MED ORDER — AZITHROMYCIN 250 MG PO TABS: ORAL_TABLET | ORAL | Status: DC

## 2013-07-27 MED ORDER — ALBUTEROL SULFATE HFA 108 (90 BASE) MCG/ACT IN AERS: 2.0000 | INHALATION_SPRAY | RESPIRATORY_TRACT | Status: DC | PRN

## 2013-07-27 NOTE — Patient Instructions (Signed)
Continue 1/2 tablet everyday and no tablet on Monday. Recheck in 6 weeks  Anticoagulation Dose Instructions as of 07/27/2013     Catherine [name] Tue Wed Thu Fri Sat   New Dose 2.5 mg 0 mg 2.5 mg 2.5 mg 2.5 mg 2.5 mg 2.5 mg    Description        Continue 1/2 tablet everyday and no tablet on Monday. Recheck in 6 weeks

## 2013-07-27 NOTE — Progress Notes (Signed)
  Subjective:    Patient ID: Catherine [name], female    DOB: 06-11-1935, 78 y.o.   MRN: [id number]  HPI Here for 2 days of fever to 102 degrees, chest congestion, and a dry cough. Some nausea without vomiting. Using Tylenol and Mucinex. Of note she had a flu shot yesterday at a pharmacy.    Review of Systems  Constitutional: Positive for fever.  HENT: Negative.   Eyes: Negative.   Respiratory: Positive for cough and chest tightness. Negative for wheezing.   Cardiovascular: Negative.        Objective:   Physical Exam  Constitutional: She appears well-developed and well-nourished. No distress.  HENT:  Right Ear: External ear normal.  Left Ear: External ear normal.  Nose: Nose normal.  Mouth/Throat: Oropharynx is clear and moist.  Eyes: Conjunctivae are normal.  Pulmonary/Chest: Effort normal. No respiratory distress. She has no wheezes. She has no rales.  Scattered rhonchi   Lymphadenopathy:    She has no cervical adenopathy.          Assessment & Plan:  Treat with a Zpack. Use the inhaler prn

## 2013-07-30 ENCOUNTER — Telehealth: Payer: Self-pay | Admitting: Family Medicine

## 2013-07-30 ENCOUNTER — Ambulatory Visit: Payer: Medicare Other

## 2013-07-30 NOTE — Telephone Encounter (Signed)
Patient Information:  Caller Name: Jihan  Phone: [phone]  Patient: Catherine [name], Catherine [name]  Gender: Female  DOB: 1935-07-09  Age: 78 Years  PCP: Gershon Crane [facility])  Office Follow Up:  Does the office need to follow up with this patient?: Yes  Instructions For The Office: No appts in office for today.  Pt was seen on 07/27/13 by Dr. Clent Ridges.  Nurse called office and spoke with Natalia Leatherwood who advised note to be sent to office for f/u.  Please f/u with Borghild.  RN Note:  Brinlynn was in the office on 07/27/13 and dx'd with bronchitis.  Placed on Zithromax and Proventil inhaler.  Cough is not worsening but is not improving.  Continuing to have a fever ranging from 100-101.  C/o shortness of breath at times.  Denies difficulty breathing at this time. Intermittent dizziness and wheezing.  Symptoms  Reason For Call & Symptoms: Cough  Reviewed Health History In EMR: Yes  Reviewed Medications In EMR: Yes  Reviewed Allergies In EMR: Yes  Reviewed Surgeries / Procedures: Yes  Date of Onset of Symptoms: 07/26/2013  Treatments Tried: Abx, and inhaler  Treatments Tried Worked: No  Any Fever: Yes  Fever Taken: Oral  Fever Time Of Reading: 12:00:00  Fever Last Reading: 100.2  Guideline(s) Used:  Cough  Disposition Per Guideline:   Go to Office Now  Reason For Disposition Reached:   Fever > 100.5 F (38.1 C) and over 66 years of age  Advice Given:  Call Back If:  Difficulty breathing  You become worse.  Patient Will Follow Care Advice:  YES

## 2013-07-30 NOTE — Telephone Encounter (Signed)
At her age, if still febrile and having dyspnea she really needs to be reassessed.

## 2013-07-31 NOTE — Telephone Encounter (Signed)
Pt stated that she is feeling better today but she still is running a fever. Pt stated she would give it one more day before she would make appointment to be seen.

## 2013-08-01 ENCOUNTER — Encounter: Payer: Self-pay | Admitting: Family Medicine

## 2013-08-01 ENCOUNTER — Ambulatory Visit (INDEPENDENT_AMBULATORY_CARE_PROVIDER_SITE_OTHER): Payer: Medicare Other | Admitting: Family Medicine

## 2013-08-01 VITALS — BP 112/70 | HR 64 | Temp 98.2°F | Wt 142.0 lb

## 2013-08-01 DIAGNOSIS — R5383 Other fatigue: Secondary | ICD-10-CM

## 2013-08-01 DIAGNOSIS — R5381 Other malaise: Secondary | ICD-10-CM

## 2013-08-01 DIAGNOSIS — R05 Cough: Secondary | ICD-10-CM

## 2013-08-01 DIAGNOSIS — I4891 Unspecified atrial fibrillation: Secondary | ICD-10-CM

## 2013-08-01 NOTE — Progress Notes (Signed)
Subjective:    Patient ID: Catherine [name], female    DOB: Mar 23, 1935, 78 y.o.   MRN: [id number]  HPI Patient seen for followup regarding recent respiratory illness Recent notes reviewed. She was seen last Friday with cough and fever No clinical evidence for pneumonia. She was placed on Zithromax. She continued to have some fever as high as 101 and 2 yesterday but none today. She does still overall improved. She has some productive cough. She had some body aches which are improving. She has some chronic dyspnea which is unchanged. No chest pains. No nausea or vomiting. Denies any sore throat. Minimal sinus congestion. Overall feels somewhat weak. No dysuria.  She has chronic atrial fibrillation which is been rate controlled. Recent INR at goal  She has complained of some chronic fatigue over several months. No chest pains. Appetite and weight are stable.   Past Medical History  Diagnosis Date  . Hypertension   . Atrial fibrillation, chronic   . Heart murmur   . Arthritis   . UTI (urinary tract infection)   . Chronic kidney disease   . Valvular heart disease     Has mitral stenosis with prior mitral commissurotomy in 1970  . H/O: rheumatic fever   . Chronic anticoagulation    Past Surgical History  Procedure Laterality Date  . Cardiac catheterization    . Back surgery      neurosurgery  . Commissurotomy  1970  . Other surgical history      hysterectomy  . Cataract surg    . Abdominal hysterectomy  1983    endometriosis    reports that she has quit smoking. She has never used smokeless tobacco. She reports that she does not drink alcohol or use illicit drugs. family history includes Leukemia in her father. Allergies  Allergen Reactions  . Ace Inhibitors   . Aldactone [Spironolactone]     dyspnea  . Benazepril Hcl     cough  . Ciprocin-Fluocin-Procin [Fluocinolone Acetonide]   . Ciprofloxacin Diarrhea  . Codeine Nausea Only  . Diltiazem     ha  . Flagyl  [Metronidazole Hcl] Other (See Comments)    headache  . Flovent [Fluticasone Propionate]     Leg cramps  . Gabapentin Swelling  . Lyrica [Pregabalin] Swelling  . Nitrofurantoin Monohyd Macro     nausea  . Quinidine     Fever diarrhea  . Tramadol Nausea Only  . Verapamil     myalgias  . Amoxicillin Palpitations    tachy      Review of Systems  Constitutional: Positive for fatigue.  HENT: Negative for trouble swallowing.   Respiratory: Positive for shortness of breath. Negative for cough and wheezing.   Cardiovascular: Negative for chest pain, palpitations and leg swelling.  Gastrointestinal: Negative for nausea, vomiting, abdominal pain, diarrhea and blood in stool.  Genitourinary: Negative for dysuria.  Neurological: Negative for dizziness and headaches.  Hematological: Negative for adenopathy.  Psychiatric/Behavioral: Negative for confusion.       Objective:   Physical Exam  Constitutional: She appears well-developed and well-nourished.  HENT:  Right Ear: External ear normal.  Left Ear: External ear normal.  Mouth/Throat: Oropharynx is clear and moist.  Neck: Neck supple. No thyromegaly present.  Cardiovascular: Normal rate.   Irregularly irregular rhythm  Pulmonary/Chest: Effort normal and breath sounds normal. No respiratory distress. She has no wheezes. She has no rales.  Musculoskeletal: She exhibits no edema.  Neurological: She is alert.  Psychiatric: She has a  normal mood and affect. Her behavior is normal.          Assessment & Plan:  #1 recent acute upper respiratory illness with cough and fever. Clinically, no indications of pneumonia. She is improving following antibiotics with Zithromax. Pulse oximetry 97% #2 somewhat chronic fatigue. Check labs with TSH, CBC, basic metabolic panel #3 chronic atrial fibrillation rate control. Recent INR goal

## 2013-08-01 NOTE — Patient Instructions (Signed)
Follow up promptly for any fever, increased shortness of breath, or any increased weakness.

## 2013-08-02 LAB — CBC WITH DIFFERENTIAL/PLATELET
Basophils Absolute: 0.1 10*3/uL (ref 0.0–0.1)
Eosinophils Relative: 2.4 % (ref 0.0–5.0)
Lymphocytes Relative: 20.2 % (ref 12.0–46.0)
Lymphs Abs: 2.4 10*3/uL (ref 0.7–4.0)
MCV: 90.5 fl (ref 78.0–100.0)
Monocytes Relative: 14.2 % — ABNORMAL HIGH (ref 3.0–12.0)
Neutro Abs: 7.3 10*3/uL (ref 1.4–7.7)
Platelets: 344 10*3/uL (ref 150.0–400.0)
RBC: 4.63 Mil/uL (ref 3.87–5.11)
RDW: 12.4 % (ref 11.5–14.6)
WBC: 11.6 10*3/uL — ABNORMAL HIGH (ref 4.5–10.5)

## 2013-08-02 LAB — BASIC METABOLIC PANEL
Chloride: 99 mEq/L (ref 96–112)
Creatinine, Ser: 0.9 mg/dL (ref 0.4–1.2)
Potassium: 4.3 mEq/L (ref 3.5–5.1)
Sodium: 138 mEq/L (ref 135–145)

## 2013-08-02 LAB — TSH: TSH: 0.82 u[IU]/mL (ref 0.35–5.50)

## 2013-08-27 ENCOUNTER — Other Ambulatory Visit: Payer: Self-pay | Admitting: Cardiology

## 2013-08-29 ENCOUNTER — Other Ambulatory Visit: Payer: Self-pay | Admitting: Cardiology

## 2013-09-04 ENCOUNTER — Ambulatory Visit (INDEPENDENT_AMBULATORY_CARE_PROVIDER_SITE_OTHER): Payer: Medicare Other | Admitting: Cardiology

## 2013-09-04 ENCOUNTER — Encounter: Payer: Self-pay | Admitting: Cardiology

## 2013-09-04 ENCOUNTER — Telehealth: Payer: Self-pay | Admitting: *Deleted

## 2013-09-04 VITALS — BP 144/56 | HR 77 | Ht 64.0 in | Wt 142.0 lb

## 2013-09-04 DIAGNOSIS — E7800 Pure hypercholesterolemia, unspecified: Secondary | ICD-10-CM

## 2013-09-04 DIAGNOSIS — I119 Hypertensive heart disease without heart failure: Secondary | ICD-10-CM

## 2013-09-04 DIAGNOSIS — Z79899 Other long term (current) drug therapy: Secondary | ICD-10-CM

## 2013-09-04 DIAGNOSIS — I050 Rheumatic mitral stenosis: Secondary | ICD-10-CM

## 2013-09-04 DIAGNOSIS — I4891 Unspecified atrial fibrillation: Secondary | ICD-10-CM

## 2013-09-04 DIAGNOSIS — E876 Hypokalemia: Secondary | ICD-10-CM

## 2013-09-04 DIAGNOSIS — I38 Endocarditis, valve unspecified: Secondary | ICD-10-CM

## 2013-09-04 LAB — BASIC METABOLIC PANEL
BUN: 11 mg/dL (ref 6–23)
CO2: 31 mEq/L (ref 19–32)
Calcium: 9.3 mg/dL (ref 8.4–10.5)
Chloride: 97 mEq/L (ref 96–112)
Creatinine, Ser: 0.8 mg/dL (ref 0.4–1.2)
GFR: 75.18 mL/min (ref >60.00)
Glucose, Bld: 108 mg/dL — ABNORMAL HIGH (ref 70–99)
Potassium: 2.8 mEq/L — CL (ref 3.5–5.1)
Sodium: 137 mEq/L (ref 135–145)

## 2013-09-04 LAB — CBC WITH DIFFERENTIAL/PLATELET
Basophils Absolute: 0 10*3/uL (ref 0.0–0.1)
Eosinophils Relative: 1.7 % (ref 0.0–5.0)
HCT: 41.2 % (ref 36.0–46.0)
Hemoglobin: 14 g/dL (ref 12.0–15.0)
Lymphs Abs: 2.2 10*3/uL (ref 0.7–4.0)
MCHC: 34 g/dL (ref 30.0–36.0)
MCV: 90.6 fl (ref 78.0–100.0)
Monocytes Absolute: 0.6 10*3/uL (ref 0.1–1.0)
Monocytes Relative: 7.1 % (ref 3.0–12.0)
Neutro Abs: 5 10*3/uL (ref 1.4–7.7)
Neutrophils Relative %: 62.9 % (ref 43.0–77.0)
Platelets: 248 10*3/uL (ref 150.0–400.0)
RDW: 13.9 % (ref 11.5–14.6)

## 2013-09-04 LAB — HEPATIC FUNCTION PANEL
AST: 32 U/L (ref 0–37)
Bilirubin, Direct: 0.1 mg/dL (ref 0.0–0.3)
Total Bilirubin: 0.9 mg/dL (ref 0.3–1.2)
Total Protein: 7.6 g/dL (ref 6.0–8.3)

## 2013-09-04 LAB — LIPID PANEL
HDL: 58.9 mg/dL (ref >39.00)
Total CHOL/HDL Ratio: 4
Triglycerides: 182 mg/dL — ABNORMAL HIGH (ref 0.0–149.0)
VLDL: 36.4 mg/dL (ref 0.0–40.0)

## 2013-09-04 LAB — LDL CHOLESTEROL, DIRECT: Direct LDL: 162.2 mg/dL

## 2013-09-04 MED ORDER — ATENOLOL 25 MG PO TABS: ORAL_TABLET | ORAL | Status: DC

## 2013-09-04 MED ORDER — POTASSIUM CHLORIDE ER 10 MEQ PO TBCR: EXTENDED_RELEASE_TABLET | ORAL | Status: DC

## 2013-09-04 NOTE — Patient Instructions (Signed)
Will obtain labs today and call you with the results (lp/bmet/hfp/cbc)  Your physician recommends that you continue on your current medications as directed. Please refer to the Current Medication list given to you today.  Your physician wants you to follow-up in: 6 months with fasting labs (lp/bmet/hfp/cbc) and EKG You will receive a reminder letter in the mail two months in advance. If you don't receive a letter, please call our office to schedule the follow-up appointment.

## 2013-09-04 NOTE — Progress Notes (Signed)
Catherine [name] Date of Birth:  09/29/1934 [address] [place], [place]  [address] [phone]         Fax   [phone]  History of Present Illness: This pleasant 78 year old woman is seen for a scheduled followup office visit. She has a past history of rheumatic fever and rheumatic heart disease. She has a history of mitral stenosis. In 1970s she underwent mitral commissurotomy by Dr. Micah Noel her last echocardiogram was in 2011 and showed a normal ejection fraction of 55-60% biatrial enlargement, mild aortic stenosis, mild mitral stenosis with moderate to severe mitral regurgitation, and mild tricuspid regurgitation with normal pulmonary artery pressure. Her mitral valve area was 1.9. She is in chronic established atrial fibrillation. She is on long-term Coumadin. Since last visit she has been doing well.  She continues to complain of lack of enough energy.  She stays physically active however.  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  Last month she had an episode of bronchitis which occurred after she took the flu shot.  She saw Dr. Caryl Never who treated her successfully with a Z-Pak.  Current Outpatient Prescriptions  Medication Sig Dispense Refill  . atenolol (TENORMIN) 25 MG tablet TAKE (1/2) TABLET DAILY.  90 tablet  1  . B Complex-C (B-COMPLEX WITH VITAMIN C) tablet Take 1 tablet by mouth daily.      . calcium-vitamin D (OSCAL WITH D) 250-125 MG-UNIT per tablet Take 1 tablet by mouth 2 (two) times daily.      . diazepam (VALIUM) 5 MG tablet TAKE 1/2 TO 1 TABLET ONCE DAILY AS NEEDED  30 tablet  2  . digoxin (LANOXIN) 0.125 MG tablet Take 1 tablet (0.125 mg total) by mouth daily.  100 tablet  1  . furosemide (LASIX) 80 MG tablet TAKE 1 TABLET TWICE DAILY OR AS DIRECTED  90 tablet  PRN  . Multiple Vitamin (MULTIVITAMIN) tablet Take 1 tablet by mouth daily.        . potassium chloride (K-DUR) 10 MEQ tablet 10 mEq. klor con 10 meq take 8  tablets daily      . triamterene-hydrochlorothiazide (MAXZIDE-25) 37.5-25 MG per tablet Take 1 tablet by mouth daily.  90 tablet  3  . warfarin (COUMADIN) 5 MG tablet daily. As directed       No current facility-administered medications for this visit.    Allergies  Allergen Reactions  . Ace Inhibitors   . Aldactone [Spironolactone]     dyspnea  . Benazepril Hcl     cough  . Ciprocin-Fluocin-Procin [Fluocinolone Acetonide]   . Ciprofloxacin Diarrhea  . Codeine Nausea Only  . Diltiazem     ha  . Flagyl [Metronidazole Hcl] Other (See Comments)    headache  . Flovent [Fluticasone Propionate]     Leg cramps  . Gabapentin Swelling  . Lyrica [Pregabalin] Swelling  . Nitrofurantoin Monohyd Macro     nausea  . Quinidine     Fever diarrhea  . Tramadol Nausea Only  . Verapamil     myalgias  . Amoxicillin Palpitations    tachy    Patient Active Problem List   Diagnosis Date Noted  . Osteoarthritis 08/11/2012  . Osteoporosis 06/06/2012  . Encounter for long-term (current) use of anticoagulants 07/21/2011  . Benign hypertensive heart disease without heart failure 04/28/2011  . Hypercholesterolemia 04/28/2011  . Low back pain 01/27/2011  . Atrial fibrillation   . COPD 01/30/2008  .  RHEUMATIC MITRAL STENOSIS 01/05/2008  . RHEUMATIC HEART DISEASE 01/05/2008  . VALVULAR HEART DISEASE 01/05/2008  . Atrial fibrillation 01/05/2008  . DYSPNEA 01/04/2008  . COUGH 01/04/2008    History  Smoking status  . Former Smoker  Smokeless tobacco  . Never Used    History  Alcohol Use No    Family History  Problem Relation Age of Onset  . Leukemia Father     Review of Systems: Constitutional: no fever chills diaphoresis or fatigue or change in weight.  Head and neck: no hearing loss, no epistaxis, no photophobia or visual disturbance. Respiratory: No cough, shortness of breath or wheezing. Cardiovascular: No chest pain peripheral edema, palpitations. Gastrointestinal: No  abdominal distention, no abdominal pain, no change in bowel habits hematochezia or melena. Genitourinary: No dysuria, no frequency, no urgency, no nocturia. Musculoskeletal:No arthralgias, no back pain, no gait disturbance or myalgias. Neurological: No dizziness, no headaches, no numbness, no seizures, no syncope, no weakness, no tremors. Hematologic: No lymphadenopathy, no easy bruising. Psychiatric: No confusion, no hallucinations, no sleep disturbance.    Physical Exam: Filed Vitals:   09/04/13 1140  BP: 144/56  Pulse: 77   general appearance reveals a healthy-appearing woman in no distress.The head and neck exam reveals pupils equal and reactive.  Extraocular movements are full.  There is no scleral icterus.  The mouth and pharynx are normal.  The neck is supple.  The carotids reveal no bruits.  The jugular venous pressure is normal.  The  thyroid is not enlarged.  There is no lymphadenopathy.  The chest is clear to percussion and auscultation.  There are no rales or rhonchi.  Expansion of the chest is symmetrical.  The precordium is quiet.  The first heart sound is normal.  The second heart sound is physiologically split.  There is  soft apical diastolic rumble  There is no abnormal lift or heave.  The abdomen is soft and nontender.  The bowel sounds are normal.  The liver and spleen are not enlarged.  There are no abdominal masses.  There are no abdominal bruits.  Extremities reveal good pedal pulses.  There is no phlebitis or edema.  There is no cyanosis or clubbing.  Strength is normal and symmetrical in all extremities.  There is no lateralizing weakness.  There are no sensory deficits.  The skin is warm and dry.  There is no rash.    Assessment / Plan: Continue same medication.  Recheck in 6 months for office visit EKG CBC lipid panel hepatic function panel and basal metabolic panel.  Lab work today is pending.

## 2013-09-04 NOTE — Assessment & Plan Note (Signed)
Exercise tolerance is stable.  She walks daily on her treadmill.  She has not been having any symptoms of CHF.

## 2013-09-04 NOTE — Assessment & Plan Note (Signed)
The patient has not been experiencing any headaches or dizzy spells.  Blood pressure is mildly elevated today.

## 2013-09-04 NOTE — Telephone Encounter (Signed)
Critical K+ level called in at 3:35pm (k+ = 2.8). Dr. Patty Sermons advising to increase her K+ supplements daily dosage to Potassium Chloride Tab CR 20 mEq by mouth daily five times per day for total daily dose of 100 mEq. Called patient to review medication change recommendations and potassium rich diet.  Patient states she can not take the 20 mEq tablets, as they are too large for her to swallow. She will take two of the 10 mEq tablets five times per day, for total daily dose of 100 mEq Potassium Chloride Tab CR.  Advised patient that she can not take more than 20 mEq Potassium Chloride tabs at any one time. Dose must be evenly spaced out throughout the day. Patient agreed and stated that this is how she normally takes it. Patient will come in for repeat BMET on Dec 19th.  Patient states she is eating potassium rich diet but she will try to add more potassium rich foods. Patient advised to call back should she experience any concerning symptoms or have increased fatigue or palpitations.  Patient verbalized understanding and appreciation of phone call and medication change.

## 2013-09-04 NOTE — Telephone Encounter (Signed)
Agree with plan.    Thanks for the follow-up!

## 2013-09-04 NOTE — Assessment & Plan Note (Signed)
The patient is in permanent atrial fibrillation.  She is on long-term Coumadin.  She has not had any TIA or stroke symptoms.

## 2013-09-04 NOTE — Progress Notes (Signed)
Quick Note:  Please report to patient. The recent labs are stable. Continue same medication and careful diet. Cholesterol and LDL are too high. Work harder on diet. Add low dose of simvastatin 10 mg daily. Liver okay. Potassium very low--Catherine [name] has already been contacted to increase her potassium to 10 tabs daily. CBC is normal. WBC is normal 8.0 ______

## 2013-09-05 NOTE — Telephone Encounter (Signed)
Message copied by Burnell Blanks on Wed Sep 05, 2013  5:36 PM ------      Message from: Cassell Clement      Created: Tue Sep 04, 2013  8:47 PM       Please report to patient.  The recent labs are stable. Continue same medication and careful diet. Cholesterol and LDL are too high. Work harder on diet.  Add low dose of simvastatin 10 mg daily. Liver okay. Potassium very low--she has already been contacted to increase her potassium to 10 tabs daily. CBC is normal. WBC is normal 8.0 ------

## 2013-09-06 ENCOUNTER — Telehealth: Payer: Self-pay | Admitting: Cardiology

## 2013-09-06 DIAGNOSIS — E876 Hypokalemia: Secondary | ICD-10-CM

## 2013-09-06 DIAGNOSIS — E7800 Pure hypercholesterolemia, unspecified: Secondary | ICD-10-CM

## 2013-09-06 MED ORDER — SIMVASTATIN 10 MG PO TABS: 10.0000 mg | ORAL_TABLET | Freq: Every day | ORAL | Status: DC

## 2013-09-06 NOTE — Telephone Encounter (Signed)
Message copied by Lendon Ka on Thu Sep 06, 2013  1:41 PM ------      Message from: Cassell Clement      Created: Tue Sep 04, 2013  8:47 PM       Please report to patient.  The recent labs are stable. Continue same medication and careful diet. Cholesterol and LDL are too high. Work harder on diet.  Add low dose of simvastatin 10 mg daily. Liver okay. Potassium very low--she has already been contacted to increase her potassium to 10 tabs daily. CBC is normal. WBC is normal 8.0 ------

## 2013-09-06 NOTE — Telephone Encounter (Signed)
Spoke with patient. Informed of lab results and Dr. Roxan Diesel recommendations/orders.  Instructed on cholesterol and low fat diet, will send simvastatin script to [facility] [phone].  Instructed pt to take this medication in evening.  Reviewed potassium dose--patient now takes (10) tabs per day.  She states she was told to return on 12/19 to have K+ rechecked.  Lab appointment made at this time.

## 2013-09-06 NOTE — Telephone Encounter (Signed)
Follow Up ° °Pt returned call//Results??//SR °

## 2013-09-07 ENCOUNTER — Encounter: Payer: Self-pay | Admitting: *Deleted

## 2013-09-10 ENCOUNTER — Ambulatory Visit (INDEPENDENT_AMBULATORY_CARE_PROVIDER_SITE_OTHER): Payer: Medicare Other | Admitting: General Practice

## 2013-09-10 DIAGNOSIS — I4891 Unspecified atrial fibrillation: Secondary | ICD-10-CM

## 2013-09-10 DIAGNOSIS — Z7901 Long term (current) use of anticoagulants: Secondary | ICD-10-CM

## 2013-09-10 LAB — POCT INR: INR: 2.5

## 2013-09-10 NOTE — Progress Notes (Signed)
Pre-visit discussion using our clinic review tool. No additional management support is needed unless otherwise documented below in the visit note.  

## 2013-09-13 NOTE — Telephone Encounter (Signed)
Advised patient of lab results  

## 2013-09-14 ENCOUNTER — Other Ambulatory Visit (INDEPENDENT_AMBULATORY_CARE_PROVIDER_SITE_OTHER): Payer: Medicare Other

## 2013-09-14 DIAGNOSIS — E876 Hypokalemia: Secondary | ICD-10-CM

## 2013-09-14 LAB — BASIC METABOLIC PANEL
CO2: 31 mEq/L (ref 19–32)
GFR: 81.06 mL/min (ref >60.00)
Potassium: 3.5 mEq/L (ref 3.5–5.1)
Sodium: 142 mEq/L (ref 135–145)

## 2013-09-14 NOTE — Progress Notes (Signed)
Quick Note:  Please report to patient. The recent labs are stable. Continue same medication and careful diet. K+ is up to 3.5 borderline. Continue same meds and eat high potassium diet. ______

## 2013-09-17 NOTE — Telephone Encounter (Signed)
Message copied by Burnell Blanks on Mon Sep 17, 2013  2:57 PM ------      Message from: Cassell Clement      Created: Fri Sep 14, 2013  9:09 PM       Please report to patient.  The recent labs are stable. Continue same medication and careful diet. K+ is up to 3.5 borderline.  Continue same meds and eat high potassium diet. ------

## 2013-09-17 NOTE — Telephone Encounter (Signed)
Advised patient of lab results and will recheck bmet at PCP in 1 month

## 2013-10-01 ENCOUNTER — Other Ambulatory Visit: Payer: Self-pay | Admitting: Cardiology

## 2013-10-17 ENCOUNTER — Other Ambulatory Visit: Payer: Self-pay | Admitting: Family Medicine

## 2013-10-17 NOTE — Telephone Encounter (Signed)
Last visit 08-01-13 Last refill 07/18/13 #30 2 refill

## 2013-10-17 NOTE — Telephone Encounter (Signed)
Refill for 3 months. 

## 2013-10-22 ENCOUNTER — Ambulatory Visit (INDEPENDENT_AMBULATORY_CARE_PROVIDER_SITE_OTHER): Payer: Medicare Other | Admitting: General Practice

## 2013-10-22 ENCOUNTER — Ambulatory Visit (INDEPENDENT_AMBULATORY_CARE_PROVIDER_SITE_OTHER): Payer: Medicare Other | Admitting: Family Medicine

## 2013-10-22 ENCOUNTER — Encounter: Payer: Self-pay | Admitting: Family Medicine

## 2013-10-22 VITALS — BP 132/68 | HR 70 | Temp 98.3°F | Wt 150.0 lb

## 2013-10-22 DIAGNOSIS — E7800 Pure hypercholesterolemia, unspecified: Secondary | ICD-10-CM

## 2013-10-22 DIAGNOSIS — R300 Dysuria: Secondary | ICD-10-CM

## 2013-10-22 DIAGNOSIS — I4891 Unspecified atrial fibrillation: Secondary | ICD-10-CM

## 2013-10-22 DIAGNOSIS — IMO0001 Reserved for inherently not codable concepts without codable children: Secondary | ICD-10-CM

## 2013-10-22 DIAGNOSIS — Z5181 Encounter for therapeutic drug level monitoring: Secondary | ICD-10-CM

## 2013-10-22 DIAGNOSIS — M791 Myalgia, unspecified site: Secondary | ICD-10-CM

## 2013-10-22 DIAGNOSIS — Z7901 Long term (current) use of anticoagulants: Secondary | ICD-10-CM

## 2013-10-22 LAB — BASIC METABOLIC PANEL
BUN: 12 mg/dL (ref 6–23)
CALCIUM: 9.8 mg/dL (ref 8.4–10.5)
CO2: 31 meq/L (ref 19–32)
CREATININE: 0.8 mg/dL (ref 0.4–1.2)
Chloride: 102 mEq/L (ref 96–112)
GFR: 71.99 mL/min (ref >60.00)
Glucose, Bld: 92 mg/dL (ref 70–99)
Potassium: 3.8 mEq/L (ref 3.5–5.1)
Sodium: 143 mEq/L (ref 135–145)

## 2013-10-22 LAB — POCT URINALYSIS DIPSTICK
Bilirubin, UA: NEGATIVE
Blood, UA: NEGATIVE
Glucose, UA: NEGATIVE
KETONES UA: NEGATIVE
Leukocytes, UA: NEGATIVE
Nitrite, UA: NEGATIVE
PH UA: 8
PROTEIN UA: NEGATIVE
SPEC GRAV UA: 1.02
Urobilinogen, UA: 0.2

## 2013-10-22 LAB — POCT INR: INR: 3.1

## 2013-10-22 NOTE — Progress Notes (Signed)
Pre-visit discussion using our clinic review tool. No additional management support is needed unless otherwise documented below in the visit note.  

## 2013-10-22 NOTE — Progress Notes (Signed)
Quick Note:  Please report to patient. The recent labs are stable. Continue same medication and careful diet. K+ is okay 3.8 ______

## 2013-10-22 NOTE — Patient Instructions (Signed)
You do not have evidence for any urine infection Stay well hydrated.

## 2013-10-22 NOTE — Progress Notes (Signed)
   Subjective:    Patient ID: Catherine [name], female    DOB: Aug 02, 1935, 79 y.o.   MRN: [id number]  HPI Patient seen with chief complaint of dysuria. She has had some vague mild low lumbar back pain for about 2 weeks. She does not describe any actual burning with urination but some slight odor intermittently. Denies any fever or chills. No nausea or vomiting.  Patient recent hypokalemia with potassium 2.8 which improved to 3.5 with replacement. She is requesting repeat basic metabolic panel today.  Recently had simvastatin initiated for hyperlipidemia. She developed some lower extremity myalgias. She stopped simvastatin and several days ago her myalgias resolved. She's not interested in looking at other statins at this point.  Past Medical History  Diagnosis Date  . Hypertension   . Atrial fibrillation, chronic   . Heart murmur   . Arthritis   . UTI (urinary tract infection)   . Chronic kidney disease   . Valvular heart disease     Has mitral stenosis with prior mitral commissurotomy in 1970  . H/O: rheumatic fever   . Chronic anticoagulation    Past Surgical History  Procedure Laterality Date  . Cardiac catheterization    . Back surgery      neurosurgery  . Commissurotomy  1970  . Other surgical history      hysterectomy  . Cataract surg    . Abdominal hysterectomy  1983    endometriosis    reports that she has quit smoking. She has never used smokeless tobacco. She reports that she does not drink alcohol or use illicit drugs. family history includes Leukemia in her father. Allergies  Allergen Reactions  . Ace Inhibitors   . Aldactone [Spironolactone]     dyspnea  . Benazepril Hcl     cough  . Ciprocin-Fluocin-Procin [Fluocinolone Acetonide]   . Ciprofloxacin Diarrhea  . Codeine Nausea Only  . Diltiazem     ha  . Flagyl [Metronidazole Hcl] Other (See Comments)    headache  . Flovent [Fluticasone Propionate]     Leg cramps  . Gabapentin Swelling  . Lyrica  [Pregabalin] Swelling  . Nitrofurantoin Monohyd Macro     nausea  . Quinidine     Fever diarrhea  . Tramadol Nausea Only  . Verapamil     myalgias  . Amoxicillin Palpitations    tachy      Review of Systems  Constitutional: Negative for fever, chills and appetite change.  Gastrointestinal: Negative for nausea, vomiting, abdominal pain, diarrhea and constipation.  Genitourinary: Positive for dysuria and frequency. Negative for hematuria.  Musculoskeletal: Negative for back pain.  Neurological: Negative for dizziness.       Objective:   Physical Exam  Constitutional: She appears well-developed and well-nourished.  Neck: Neck supple. No thyromegaly present.  Cardiovascular: Normal rate.   Pulmonary/Chest: Effort normal and breath sounds normal. No respiratory distress. She has no wheezes. She has no rales.  Musculoskeletal: She exhibits no edema.          Assessment & Plan:  #1 dysuria. Urine dipstick does not reveal evidence for UTI. Stay well hydrated and observed. We explained some foods can cause increased odor. She'll try to see if there's any correlation with specific foods #2 recent hypokalemia. Repeat basic metabolic panel #3 myalgias possibly related to simvastatin. Her symptoms did resolve after stopping simvastatin. She's not interested in exploring other statins at this point and we will leave this up to her cardiologist

## 2013-10-22 NOTE — Progress Notes (Signed)
Pre visit review using our clinic review tool, if applicable. No additional management support is needed unless otherwise documented below in the visit note. 

## 2013-10-23 ENCOUNTER — Telehealth: Payer: Self-pay | Admitting: *Deleted

## 2013-10-23 NOTE — Telephone Encounter (Signed)
Ok to leave off simvastatin

## 2013-10-23 NOTE — Telephone Encounter (Signed)
Message copied by Earvin Hansen on Tue Oct 23, 2013  9:04 AM ------      Message from: Darlin Coco      Created: Mon Oct 22, 2013  9:41 PM       Please report to patient.  The recent labs are stable. Continue same medication and careful diet. K+ is okay 3.8 ------

## 2013-10-23 NOTE — Telephone Encounter (Signed)
Advised patient of lab results   Patient did stop her Simvastatin a few days ago secondary to having pains in her legs for last few weeks, would wake her up at night. Since she d/c much better. Will continue off and for to [name].

## 2013-12-03 ENCOUNTER — Ambulatory Visit (INDEPENDENT_AMBULATORY_CARE_PROVIDER_SITE_OTHER): Payer: Medicare Other | Admitting: General Practice

## 2013-12-03 DIAGNOSIS — Z5181 Encounter for therapeutic drug level monitoring: Secondary | ICD-10-CM

## 2013-12-03 DIAGNOSIS — I4891 Unspecified atrial fibrillation: Secondary | ICD-10-CM

## 2013-12-03 DIAGNOSIS — Z7901 Long term (current) use of anticoagulants: Secondary | ICD-10-CM

## 2013-12-03 LAB — POCT INR: INR: 3.3

## 2013-12-03 NOTE — Progress Notes (Signed)
Pre visit review using our clinic review tool, if applicable. No additional management support is needed unless otherwise documented below in the visit note. 

## 2013-12-05 ENCOUNTER — Encounter: Payer: Self-pay | Admitting: Family Medicine

## 2013-12-05 ENCOUNTER — Ambulatory Visit (INDEPENDENT_AMBULATORY_CARE_PROVIDER_SITE_OTHER): Payer: Medicare Other | Admitting: Family Medicine

## 2013-12-05 VITALS — BP 124/66 | HR 99 | Temp 99.6°F | Wt 149.0 lb

## 2013-12-05 DIAGNOSIS — J111 Influenza due to unidentified influenza virus with other respiratory manifestations: Secondary | ICD-10-CM

## 2013-12-05 DIAGNOSIS — R6889 Other general symptoms and signs: Secondary | ICD-10-CM

## 2013-12-05 LAB — POCT RAPID STREP A (OFFICE): Rapid Strep A Screen: NEGATIVE

## 2013-12-05 LAB — POCT INFLUENZA A/B
INFLUENZA A, POC: POSITIVE
Influenza B, POC: POSITIVE

## 2013-12-05 MED ORDER — OSELTAMIVIR PHOSPHATE 75 MG PO CAPS: 75.0000 mg | ORAL_CAPSULE | Freq: Two times a day (BID) | ORAL | Status: DC

## 2013-12-05 MED ORDER — ONDANSETRON 8 MG PO TBDP: 8.0000 mg | ORAL_TABLET | Freq: Three times a day (TID) | ORAL | Status: DC | PRN

## 2013-12-05 MED ORDER — ALBUTEROL SULFATE HFA 108 (90 BASE) MCG/ACT IN AERS: 2.0000 | INHALATION_SPRAY | Freq: Four times a day (QID) | RESPIRATORY_TRACT | Status: DC | PRN

## 2013-12-05 NOTE — Progress Notes (Signed)
Pre visit review using our clinic review tool, if applicable. No additional management support is needed unless otherwise documented below in the visit note. 

## 2013-12-05 NOTE — Progress Notes (Signed)
   Subjective:    Patient ID: Catherine [name], female    DOB: 1935/02/19, 79 y.o.   MRN: [id number]  Cough Associated symptoms include chills, a fever, headaches, a sore throat and shortness of breath.   Acute visit. Patient had acute onset yesterday of cough, sore throat, nausea without vomiting, fever 102.4, and severe body aches and fatigue. She's been drinking some fluids with very poor appetite. No diarrhea. No skin rash. She has some recent sick contacts. She had flu vaccine earlier this year.  Past Medical History  Diagnosis Date  . Hypertension   . Atrial fibrillation, chronic   . Heart murmur   . Arthritis   . UTI (urinary tract infection)   . Chronic kidney disease   . Valvular heart disease     Has mitral stenosis with prior mitral commissurotomy in 1970  . H/O: rheumatic fever   . Chronic anticoagulation    Past Surgical History  Procedure Laterality Date  . Cardiac catheterization    . Back surgery      neurosurgery  . Commissurotomy  1970  . Other surgical history      hysterectomy  . Cataract surg    . Abdominal hysterectomy  1983    endometriosis    reports that she has quit smoking. She has never used smokeless tobacco. She reports that she does not drink alcohol or use illicit drugs. family history includes Leukemia in her father. Allergies  Allergen Reactions  . Ace Inhibitors   . Aldactone [Spironolactone]     dyspnea  . Benazepril Hcl     cough  . Ciprocin-Fluocin-Procin [Fluocinolone Acetonide]   . Ciprofloxacin Diarrhea  . Codeine Nausea Only  . Diltiazem     ha  . Flagyl [Metronidazole Hcl] Other (See Comments)    headache  . Flovent [Fluticasone Propionate]     Leg cramps  . Gabapentin Swelling  . Lyrica [Pregabalin] Swelling  . Nitrofurantoin Monohyd Macro     nausea  . Quinidine     Fever diarrhea  . Simvastatin     Leg pain   . Tramadol Nausea Only  . Verapamil     myalgias  . Amoxicillin Palpitations    tachy     Review  of Systems  Constitutional: Positive for fever, chills and fatigue.  HENT: Positive for congestion and sore throat.   Respiratory: Positive for cough and shortness of breath.   Gastrointestinal: Negative for abdominal pain.  Genitourinary: Negative for dysuria.  Neurological: Positive for headaches.       Objective:   Physical Exam  Constitutional: She appears well-developed and well-nourished.  HENT:  Right Ear: External ear normal.  Left Ear: External ear normal.  Mouth/Throat: Oropharynx is clear and moist.  Neck: Neck supple.  Cardiovascular: Normal rate.   Pulmonary/Chest: Effort normal and breath sounds normal. No respiratory distress. She has no wheezes. She has no rales.  Lymphadenopathy:    She has no cervical adenopathy.  Skin: No rash noted.          Assessment & Plan:  Febrile illness. Rapid strep negative. Influenza screen positive. Start Tamiflu 75 mg twice a day for 5 days. Albuterol inhaler given for as needed use. She does not have any significant reactive airway component this time. Zofran 8 mg every 8 hours as needed for nausea. Increase hydration. Followup promptly for any worsening symptoms

## 2013-12-05 NOTE — Patient Instructions (Signed)

## 2013-12-08 ENCOUNTER — Emergency Department (HOSPITAL_COMMUNITY)
Admission: EM | Admit: 2013-12-08 | Discharge: 2013-12-08 | Disposition: A | Discharge status: Home / Self Care | Payer: Medicare Other | Attending: Emergency Medicine | Admitting: Emergency Medicine

## 2013-12-08 ENCOUNTER — Encounter (HOSPITAL_COMMUNITY): Payer: Self-pay | Admitting: Emergency Medicine

## 2013-12-08 ENCOUNTER — Emergency Department (HOSPITAL_COMMUNITY): Payer: Medicare Other

## 2013-12-08 DIAGNOSIS — R05 Cough: Secondary | ICD-10-CM

## 2013-12-08 DIAGNOSIS — I129 Hypertensive chronic kidney disease with stage 1 through stage 4 chronic kidney disease, or unspecified chronic kidney disease: Secondary | ICD-10-CM | POA: Insufficient documentation

## 2013-12-08 DIAGNOSIS — Z79899 Other long term (current) drug therapy: Secondary | ICD-10-CM | POA: Insufficient documentation

## 2013-12-08 DIAGNOSIS — R42 Dizziness and giddiness: Secondary | ICD-10-CM | POA: Insufficient documentation

## 2013-12-08 DIAGNOSIS — Z7901 Long term (current) use of anticoagulants: Secondary | ICD-10-CM | POA: Insufficient documentation

## 2013-12-08 DIAGNOSIS — N189 Chronic kidney disease, unspecified: Secondary | ICD-10-CM | POA: Insufficient documentation

## 2013-12-08 DIAGNOSIS — R011 Cardiac murmur, unspecified: Secondary | ICD-10-CM | POA: Insufficient documentation

## 2013-12-08 DIAGNOSIS — I4891 Unspecified atrial fibrillation: Secondary | ICD-10-CM | POA: Insufficient documentation

## 2013-12-08 DIAGNOSIS — Z8739 Personal history of other diseases of the musculoskeletal system and connective tissue: Secondary | ICD-10-CM | POA: Insufficient documentation

## 2013-12-08 DIAGNOSIS — Z87440 Personal history of urinary (tract) infections: Secondary | ICD-10-CM | POA: Insufficient documentation

## 2013-12-08 DIAGNOSIS — J441 Chronic obstructive pulmonary disease with (acute) exacerbation: Secondary | ICD-10-CM

## 2013-12-08 DIAGNOSIS — Z87891 Personal history of nicotine dependence: Secondary | ICD-10-CM | POA: Insufficient documentation

## 2013-12-08 DIAGNOSIS — R059 Cough, unspecified: Secondary | ICD-10-CM

## 2013-12-08 DIAGNOSIS — Z9889 Other specified postprocedural states: Secondary | ICD-10-CM | POA: Insufficient documentation

## 2013-12-08 DIAGNOSIS — Z8619 Personal history of other infectious and parasitic diseases: Secondary | ICD-10-CM | POA: Insufficient documentation

## 2013-12-08 LAB — CBC WITH DIFFERENTIAL/PLATELET
Basophils Absolute: 0 10*3/uL (ref 0.0–0.1)
Basophils Relative: 1 % (ref 0–1)
Eosinophils Absolute: 0.1 10*3/uL (ref 0.0–0.7)
Eosinophils Relative: 2 % (ref 0–5)
HCT: 39.9 % (ref 36.0–46.0)
Hemoglobin: 13.9 g/dL (ref 12.0–15.0)
Lymphocytes Relative: 35 % (ref 12–46)
Lymphs Abs: 1.9 10*3/uL (ref 0.7–4.0)
MCH: 31.3 pg (ref 26.0–34.0)
MCHC: 34.8 g/dL (ref 30.0–36.0)
MCV: 89.9 fL (ref 78.0–100.0)
Monocytes Absolute: 0.8 10*3/uL (ref 0.1–1.0)
Monocytes Relative: 14 % — ABNORMAL HIGH (ref 3–12)
Neutro Abs: 2.7 10*3/uL (ref 1.7–7.7)
Neutrophils Relative %: 49 % (ref 43–77)
Platelets: 189 10*3/uL (ref 150–400)
RBC: 4.44 MIL/uL (ref 3.87–5.11)
RDW: 13 % (ref 11.5–15.5)
WBC: 5.5 10*3/uL (ref 4.0–10.5)

## 2013-12-08 LAB — COMPREHENSIVE METABOLIC PANEL
ALT: 36 U/L — ABNORMAL HIGH (ref 0–35)
AST: 49 U/L — ABNORMAL HIGH (ref 0–37)
Albumin: 4 g/dL (ref 3.5–5.2)
Alkaline Phosphatase: 60 U/L (ref 39–117)
BUN: 13 mg/dL (ref 6–23)
CO2: 30 mEq/L (ref 19–32)
Calcium: 9 mg/dL (ref 8.4–10.5)
Chloride: 96 mEq/L (ref 96–112)
Creatinine, Ser: 0.79 mg/dL (ref 0.50–1.10)
GFR calc Af Amer: >90 mL/min (ref >90)
GFR calc non Af Amer: 79 mL/min — ABNORMAL LOW (ref >90)
Glucose, Bld: 112 mg/dL — ABNORMAL HIGH (ref 70–99)
Potassium: 3.1 mEq/L — ABNORMAL LOW (ref 3.7–5.3)
Sodium: 141 mEq/L (ref 137–147)
Total Bilirubin: 0.7 mg/dL (ref 0.3–1.2)
Total Protein: 7.1 g/dL (ref 6.0–8.3)

## 2013-12-08 LAB — PROTIME-INR
INR: 1.42 (ref 0.00–1.49)
PROTHROMBIN TIME: 17 s — AB (ref 11.6–15.2)

## 2013-12-08 LAB — I-STAT TROPONIN, ED: Troponin i, poc: 0.02 ng/mL (ref 0.00–0.08)

## 2013-12-08 MED ORDER — POTASSIUM CHLORIDE CRYS ER 20 MEQ PO TBCR
40.0000 meq | EXTENDED_RELEASE_TABLET | Freq: Once | ORAL | Status: AC
Start: 2013-12-08 — End: 2013-12-08
  Administered 2013-12-08: 40 meq via ORAL
  Filled 2013-12-08: qty 2

## 2013-12-08 MED ORDER — ALBUTEROL SULFATE (2.5 MG/3ML) 0.083% IN NEBU
INHALATION_SOLUTION | RESPIRATORY_TRACT | Status: AC
  Administered 2013-12-08: 20:00:00
  Filled 2013-12-08: qty 3

## 2013-12-08 MED ORDER — PREDNISONE 20 MG PO TABS: 40.0000 mg | ORAL_TABLET | Freq: Every day | ORAL | Status: DC

## 2013-12-08 MED ORDER — IPRATROPIUM BROMIDE 0.02 % IN SOLN
0.5000 mg | Freq: Once | RESPIRATORY_TRACT | Status: AC
  Administered 2013-12-08: 0.5 mg via RESPIRATORY_TRACT
  Filled 2013-12-08: qty 2.5

## 2013-12-08 MED ORDER — FLUCONAZOLE 150 MG PO TABS: 150.0000 mg | ORAL_TABLET | Freq: Once | ORAL | Status: DC

## 2013-12-08 MED ORDER — ALBUTEROL SULFATE (2.5 MG/3ML) 0.083% IN NEBU
5.0000 mg | INHALATION_SOLUTION | Freq: Once | RESPIRATORY_TRACT | Status: AC
  Administered 2013-12-08: 5 mg via RESPIRATORY_TRACT
  Filled 2013-12-08: qty 6

## 2013-12-08 MED ORDER — GUAIFENESIN 100 MG/5ML PO LIQD: 100.0000 mg | ORAL | Status: DC | PRN

## 2013-12-08 MED ORDER — CEFUROXIME AXETIL 250 MG PO TABS: 250.0000 mg | ORAL_TABLET | Freq: Two times a day (BID) | ORAL | Status: DC

## 2013-12-08 MED ORDER — AZITHROMYCIN 250 MG PO TABS: 250.0000 mg | ORAL_TABLET | Freq: Every day | ORAL | Status: DC

## 2013-12-08 NOTE — ED Notes (Addendum)
This RN ambulated with pt around the pod. Pt maintained O2 sats between 90%-92%, and occasionally dipped down to 88% for a few seconds. Pt denied SOB, and this RN did not notice and dyspnea. Marlon Pel, [place] is aware.

## 2013-12-08 NOTE — ED Provider Notes (Signed)
CSN: [id number]     Arrival date & time 12/08/13  1748 History   First MD Initiated Contact with Patient 12/08/13 1828     Chief Complaint  Patient presents with  . Shortness of Breath     (Consider location/radiation/quality/duration/timing/severity/associated sxs/prior Treatment) HPI Comments: Patient presents emergency department with chief complaint of cough and weakness. She states that she was recently diagnosed by her PCP with the flu. She states that she was told to come to the emergency department if her symptoms worsened. She states that her breathing has worsened, and states that she feels lightheaded from time to time. She is tried using an inhaler with no relief. She has a history of COPD. Additionally, she states that she has run a fever to 102, but believes the fever broke last night. She has been taking Tamiflu. She denies any nausea, vomiting, diarrhea, constipation.  The history is provided by the patient. No language interpreter was used.    Past Medical History  Diagnosis Date  . Hypertension   . Atrial fibrillation, chronic   . Heart murmur   . Arthritis   . UTI (urinary tract infection)   . Chronic kidney disease   . Valvular heart disease     Has mitral stenosis with prior mitral commissurotomy in 1970  . H/O: rheumatic fever   . Chronic anticoagulation    Past Surgical History  Procedure Laterality Date  . Cardiac catheterization    . Back surgery      neurosurgery  . Commissurotomy  1970  . Other surgical history      hysterectomy  . Cataract surg    . Abdominal hysterectomy  1983    endometriosis   Family History  Problem Relation Age of Onset  . Leukemia Father    History  Substance Use Topics  . Smoking status: Former Research scientist (life sciences)  . Smokeless tobacco: Never Used  . Alcohol Use: No   OB History   Grav Para Term Preterm Abortions TAB SAB Ect Mult Living                 Review of Systems  All other systems reviewed and are  negative.      Allergies  Ace inhibitors; Aldactone; Benazepril hcl; Ciprocin-fluocin-procin; Ciprofloxacin; Codeine; Diltiazem; Flagyl; Flovent; Gabapentin; Lyrica; Nitrofurantoin monohyd macro; Quinidine; Simvastatin; Tramadol; Verapamil; and Amoxicillin  Home Medications   Current Outpatient Rx  Name  Route  Sig  Dispense  Refill  . albuterol (PROVENTIL HFA;VENTOLIN HFA) 108 (90 BASE) MCG/ACT inhaler   Inhalation   Inhale 2 puffs into the lungs every 6 (six) hours as needed for wheezing or shortness of breath.   1 Inhaler   0   . atenolol (TENORMIN) 25 MG tablet      TAKE (1/2) TABLET DAILY.   90 tablet   1   . B Complex-C (B-COMPLEX WITH VITAMIN C) tablet   Oral   Take 1 tablet by mouth daily.         . calcium-vitamin D (OSCAL WITH D) 250-125 MG-UNIT per tablet   Oral   Take 1 tablet by mouth 2 (two) times daily.         . diazepam (VALIUM) 5 MG tablet      TAKE (1/2) TO (1) TABLET DAILY AS NEEDED.   30 tablet   2   . digoxin (LANOXIN) 0.125 MG tablet      TAKE 1 TABLET DAILY   90 tablet   1   .  furosemide (LASIX) 80 MG tablet      TAKE 1 TABLET TWICE DAILY OR AS DIRECTED   90 tablet   PRN   . Multiple Vitamin (MULTIVITAMIN) tablet   Oral   Take 1 tablet by mouth daily.           . ondansetron (ZOFRAN ODT) 8 MG disintegrating tablet   Oral   Take 1 tablet (8 mg total) by mouth every 8 (eight) hours as needed for nausea or vomiting.   15 tablet   0   . oseltamivir (TAMIFLU) 75 MG capsule   Oral   Take 1 capsule (75 mg total) by mouth 2 (two) times daily.   10 capsule   0   . potassium chloride (K-DUR) 10 MEQ tablet      10 mEq - klor con 10 mEq take 10 tablets by mouth daily for total daily dose of 100 mEq (Take 2 tablets, five times per day)   900 tablet   1   . triamterene-hydrochlorothiazide (MAXZIDE-25) 37.5-25 MG per tablet   Oral   Take 1 tablet by mouth daily.   90 tablet   3   . warfarin (COUMADIN) 5 MG tablet       daily. As directed          BP 120/85  Pulse 62  Temp(Src) 98.8 F (37.1 C) (Oral)  Resp 20  Ht 5\' 5"  (1.651 m)  Wt 140 lb (63.504 kg)  BMI 23.30 kg/m2  SpO2 95% Physical Exam  Nursing note and vitals reviewed. Constitutional: She is oriented to person, place, and time. She appears well-developed and well-nourished.  HENT:  Head: Normocephalic and atraumatic.  Eyes: Conjunctivae and EOM are normal. Pupils are equal, round, and reactive to light.  Neck: Normal range of motion. Neck supple.  Cardiovascular: Normal rate and regular rhythm.  Exam reveals no gallop and no friction rub.   No murmur heard. Pulmonary/Chest: Effort normal. No respiratory distress. She has wheezes. She has no rales. She exhibits no tenderness.  Moderate end expiratory wheezes  Abdominal: Soft. Bowel sounds are normal. She exhibits no distension and no mass. There is no tenderness. There is no rebound and no guarding.  Musculoskeletal: Normal range of motion. She exhibits no edema and no tenderness.  Neurological: She is alert and oriented to person, place, and time.  Skin: Skin is warm and dry.  Psychiatric: She has a normal mood and affect. Her behavior is normal. Judgment and thought content normal.    ED Course  Procedures (including critical care time) Results for orders placed during the hospital encounter of 12/08/13  CBC WITH DIFFERENTIAL      Result Value Ref Range   WBC 5.5  4.0 - 10.5 K/uL   RBC 4.44  3.87 - 5.11 MIL/uL   Hemoglobin 13.9  12.0 - 15.0 g/dL   HCT 39.9  36.0 - 46.0 %   MCV 89.9  78.0 - 100.0 fL   MCH 31.3  26.0 - 34.0 pg   MCHC 34.8  30.0 - 36.0 g/dL   RDW 13.0  11.5 - 15.5 %   Platelets 189  150 - 400 K/uL   Neutrophils Relative % 49  43 - 77 %   Neutro Abs 2.7  1.7 - 7.7 K/uL   Lymphocytes Relative 35  12 - 46 %   Lymphs Abs 1.9  0.7 - 4.0 K/uL   Monocytes Relative 14 (*) 3 - 12 %   Monocytes Absolute 0.8  0.1 - 1.0 K/uL   Eosinophils Relative 2  0 - 5 %    Eosinophils Absolute 0.1  0.0 - 0.7 K/uL   Basophils Relative 1  0 - 1 %   Basophils Absolute 0.0  0.0 - 0.1 K/uL  COMPREHENSIVE METABOLIC PANEL      Result Value Ref Range   Sodium 141  137 - 147 mEq/L   Potassium 3.1 (*) 3.7 - 5.3 mEq/L   Chloride 96  96 - 112 mEq/L   CO2 30  19 - 32 mEq/L   Glucose, Bld 112 (*) 70 - 99 mg/dL   BUN 13  6 - 23 mg/dL   Creatinine, Ser 0.79  0.50 - 1.10 mg/dL   Calcium 9.0  8.4 - 10.5 mg/dL   Total Protein 7.1  6.0 - 8.3 g/dL   Albumin 4.0  3.5 - 5.2 g/dL   AST 49 (*) 0 - 37 U/L   ALT 36 (*) 0 - 35 U/L   Alkaline Phosphatase 60  39 - 117 U/L   Total Bilirubin 0.7  0.3 - 1.2 mg/dL   GFR calc non Af Amer 79 (*) >90 mL/min   GFR calc Af Amer >90  >90 mL/min  PROTIME-INR      Result Value Ref Range   Prothrombin Time 17.0 (*) 11.6 - 15.2 seconds   INR 1.42  0.00 - 1.49  I-STAT TROPOININ, ED      Result Value Ref Range   Troponin i, poc 0.02  0.00 - 0.08 ng/mL   Comment 3            Dg Chest 2 View  12/08/2013   CLINICAL DATA:  Recently diagnosed with flu. Productive cough. Wheezing. Ex-smoker.  EXAM: CHEST  2 VIEW  COMPARISON:  DG CHEST 2 VIEW dated 11/17/2012  FINDINGS: Mild hyperinflation. Lower cervical spine fixation. Midline trachea. Mild cardiomegaly with transverse aortic atherosclerosis. No pleural effusion or pneumothorax. Mild chronic lower lobe predominant interstitial thickening. No lobar consolidation.  IMPRESSION: No acute cardiopulmonary disease.  Peribronchial thickening which may relate to chronic bronchitis or prior smoking.  Cardiomegaly without congestive failure.   Electronically Signed   By: Abigail Miyamoto M.D.   On: 12/08/2013 18:50    Imaging Review Dg Chest 2 View  12/08/2013   CLINICAL DATA:  Recently diagnosed with flu. Productive cough. Wheezing. Ex-smoker.  EXAM: CHEST  2 VIEW  COMPARISON:  DG CHEST 2 VIEW dated 11/17/2012  FINDINGS: Mild hyperinflation. Lower cervical spine fixation. Midline trachea. Mild cardiomegaly with  transverse aortic atherosclerosis. No pleural effusion or pneumothorax. Mild chronic lower lobe predominant interstitial thickening. No lobar consolidation.  IMPRESSION: No acute cardiopulmonary disease.  Peribronchial thickening which may relate to chronic bronchitis or prior smoking.  Cardiomegaly without congestive failure.   Electronically Signed   By: Abigail Miyamoto M.D.   On: 12/08/2013 18:50     EKG Interpretation None      MDM   Final diagnoses:  Cough  COPD exacerbation    Patient with recent diagnosis of flu. Chest x-ray is negative. Vitals are stable. Will give breathing treatment, check labs, and reassess.  8:07 PM Patient recess, pulse oxygenation is 93% on room air. She feels better after the breathing treatment. I will give her an additional breathing treatments and reassess.  Patient seen by and discussed with Dr. Wilson Singer.  Patient would like to go home.  She ambulates while maintaining >90% O2 saturation.  Return precautions given.  Will start her on  abx and steroids.  Medications reviewed with Dr. Wilson Singer.  Patient is stable and ready for discharge.  Montine Circle, PA-C 12/08/13 2218

## 2013-12-08 NOTE — ED Notes (Signed)
Pt presents to department for evaluation of SOB. Recently diagnosed with flu. No relief of symptoms at home. Reports increasing shortness of breath and cough. Denies pain at present. Respirations unlabored. Speaking complete sentences. Pt is conscious alert and oriented x4.

## 2013-12-08 NOTE — Discharge Instructions (Signed)
Chronic Obstructive Pulmonary Disease  Chronic obstructive pulmonary disease (COPD) is a common lung condition in which airflow from the lungs is limited. COPD is a general term that can be used to describe many different lung problems that limit airflow, including both chronic bronchitis and emphysema.  If you have COPD, your lung function will probably never return to normal, but there are measures you can take to improve lung function and make yourself feel better.   CAUSES   · Smoking (common).    · Exposure to secondhand smoke.    · Genetic problems.  · Chronic inflammatory lung diseases or recurrent infections.  SYMPTOMS   · Shortness of breath, especially with physical activity.    · Deep, persistent (chronic) cough with a large amount of thick mucus.    · Wheezing.    · Rapid breaths (tachypnea).    · Gray or bluish discoloration (cyanosis) of the skin, especially in fingers, toes, or lips.    · Fatigue.    · Weight loss.    · Frequent infections or episodes when breathing symptoms become much worse (exacerbations).    · Chest tightness.  DIAGNOSIS   Your healthcare provider will take a medical history and perform a physical examination to make the initial diagnosis.  Additional tests for COPD may include:   · Lung (pulmonary) function tests.  · Chest X-ray.  · CT scan.  · Blood tests.  TREATMENT   Treatment available to help you feel better when you have COPD include:   · Inhaler and nebulizer medicines. These help manage the symptoms of COPD and make your breathing more comfortable  · Supplemental oxygen. Supplemental oxygen is only helpful if you have a low oxygen level in your blood.    · Exercise and physical activity. These are beneficial for nearly all people with COPD. Some people may also benefit from a pulmonary rehabilitation program.  HOME CARE INSTRUCTIONS   · Take all medicines (inhaled or pills) as directed by your health care provider.  · Only take over-the-counter or prescription medicines  for pain, fever, or discomfort as directed by your health care provider.    · Avoid over-the-counter medicines or cough syrups that dry up your airway (such as antihistamines) and slow down the elimination of secretions unless instructed otherwise by your healthcare provider.    · If you are a smoker, the most important thing that you can do is stop smoking. Continuing to smoke will cause further lung damage and breathing trouble. Ask your health care provider for help with quitting smoking. He or she can direct you to community resources or hospitals that provide support.  · Avoid exposure to irritants such as smoke, chemicals, and fumes that aggravate your breathing.  · Use oxygen therapy and pulmonary rehabilitation if directed by your health care provider. If you require home oxygen therapy, ask your healthcare provider whether you should purchase a pulse oximeter to measure your oxygen level at home.    · Avoid contact with individuals who have a contagious illness.  · Avoid extreme temperature and humidity changes.  · Eat healthy foods. Eating smaller, more frequent meals and resting before meals may help you maintain your strength.  · Stay active, but balance activity with periods of rest. Exercise and physical activity will help you maintain your ability to do things you want to do.  · Preventing infection and hospitalization is very important when you have COPD. Make sure to receive all the vaccines your health care provider recommends, especially the pneumococcal and influenza vaccines. Ask your healthcare provider whether you   need a pneumonia vaccine.  · Learn and use relaxation techniques to manage stress.  · Learn and use controlled breathing techniques as directed by your health care provider. Controlled breathing techniques include:    · Pursed lip breathing. Start by breathing in (inhaling) through your nose for 1 second. Then, purse your lips as if you were going to whistle and breathe out (exhale)  through the pursed lips for 2 seconds.    · Diaphragmatic breathing. Start by putting one hand on your abdomen just above your waist. Inhale slowly through your nose. The hand on your abdomen should move out. Then purse your lips and exhale slowly. You should be able to feel the hand on your abdomen moving in as you exhale.    · Learn and use controlled coughing to clear mucus from your lungs. Controlled coughing is a series of short, progressive coughs. The steps of controlled coughing are:    1. Lean your head slightly forward.    2. Breathe in deeply using diaphragmatic breathing.    3. Try to hold your breath for 3 seconds.    4. Keep your mouth slightly open while coughing twice.    5. Spit any mucus out into a tissue.    6. Rest and repeat the steps once or twice as needed.  SEEK MEDICAL CARE IF:   · You are coughing up more mucus than usual.    · There is a change in the color or thickness of your mucus.    · Your breathing is more labored than usual.    · Your breathing is faster than usual.    SEEK IMMEDIATE MEDICAL CARE IF:   · You have shortness of breath while you are resting.    · You have shortness of breath that prevents you from:  · Being able to talk.    · Performing your usual physical activities.    · You have chest pain lasting longer than 5 minutes.    · Your skin color is more cyanotic than usual.  · You measure low oxygen saturations for longer than 5 minutes with a pulse oximeter.  MAKE SURE YOU:   · Understand these instructions.  · Will watch your condition.  · Will get help right away if you are not doing well or get worse.  Document Released: 06/23/2005 Document Revised: 07/04/2013 Document Reviewed: 05/10/2013  [employer]® Patient Information ©2014 [employer].

## 2013-12-11 NOTE — ED Provider Notes (Signed)
Medical screening examination/treatment/procedure(s) were performed by non-physician practitioner and as supervising physician I was immediately available for consultation/collaboration.   EKG Interpretation   Date/Time:  Saturday December 08 2013 17:55:50 EDT Ventricular Rate:  58 PR Interval:    QRS Duration: 88 QT Interval:  446 QTC Calculation: 437 R Axis:   66 Text Interpretation:  Atrial fibrillation with slow ventricular response  Septal infarct , age undetermined Abnormal ECG ED PHYSICIAN INTERPRETATION  AVAILABLE IN [facility] Confirmed by TEST, Record ([id number]) on  12/10/2013 7:25:06 AM       Virgel Manifold, MD 12/11/13 2229

## 2013-12-12 ENCOUNTER — Encounter: Payer: Self-pay | Admitting: Family Medicine

## 2013-12-12 ENCOUNTER — Ambulatory Visit (INDEPENDENT_AMBULATORY_CARE_PROVIDER_SITE_OTHER): Payer: Medicare Other | Admitting: Family Medicine

## 2013-12-12 VITALS — BP 120/70 | HR 60 | Temp 97.8°F | Wt 140.0 lb

## 2013-12-12 DIAGNOSIS — E876 Hypokalemia: Secondary | ICD-10-CM

## 2013-12-12 DIAGNOSIS — R059 Cough, unspecified: Secondary | ICD-10-CM

## 2013-12-12 DIAGNOSIS — J111 Influenza due to unidentified influenza virus with other respiratory manifestations: Secondary | ICD-10-CM

## 2013-12-12 DIAGNOSIS — R05 Cough: Secondary | ICD-10-CM

## 2013-12-12 NOTE — Progress Notes (Signed)
Pre visit review using our clinic review tool, if applicable. No additional management support is needed unless otherwise documented below in the visit note. 

## 2013-12-12 NOTE — Progress Notes (Signed)
Subjective:    Patient ID: Catherine [name], female    DOB: 1935/01/22, 79 y.o.   MRN: [id number]  HPI Patient seen for ER followup. She was seen here last week and positive screen for influenza. We started Tamiflu. She continued to cough and had progressive weakness and presented to emergency department on Saturday for further evaluation. Chest x-ray revealed no acute findings. Labs were unremarkable with exception of potassium 3.1. Her white count was 5.5 thousand. She is afebrile this time. She continues to have persistent dry cough. No further fever. Patient was treated with prednisone 40 mg daily for 5 days and Ceftin though again no pneumonia noted on x-ray. She was diagnosed with COPD exacerbation. She's using albuterol inhaler as needed which does help occasionally with her cough.  She smoked about 15 years. She does not have a definitive diagnosis of COPD. No recent pulmonary functions.  Past Medical History  Diagnosis Date  . Hypertension   . Atrial fibrillation, chronic   . Heart murmur   . Arthritis   . UTI (urinary tract infection)   . Chronic kidney disease   . Valvular heart disease     Has mitral stenosis with prior mitral commissurotomy in 1970  . H/O: rheumatic fever   . Chronic anticoagulation    Past Surgical History  Procedure Laterality Date  . Cardiac catheterization    . Back surgery      neurosurgery  . Commissurotomy  1970  . Other surgical history      hysterectomy  . Cataract surg    . Abdominal hysterectomy  1983    endometriosis    reports that she has quit smoking. She has never used smokeless tobacco. She reports that she does not drink alcohol or use illicit drugs. family history includes Leukemia in her father. Allergies  Allergen Reactions  . Ace Inhibitors Other (See Comments)    unknown  . Aldactone [Spironolactone]     dyspnea  . Benazepril Hcl     cough  . Ciprocin-Fluocin-Procin [Fluocinolone Acetonide] Other (See Comments)   unknown  . Ciprofloxacin Diarrhea  . Codeine Nausea Only  . Diltiazem     ha  . Flagyl [Metronidazole Hcl] Other (See Comments)    headache  . Flovent [Fluticasone Propionate]     Leg cramps  . Gabapentin Swelling  . Lyrica [Pregabalin] Swelling  . Nitrofurantoin Monohyd Macro     nausea  . Quinidine     Fever diarrhea  . Simvastatin     Leg pain   . Tramadol Nausea Only  . Verapamil     myalgias  . Amoxicillin Palpitations    tachy      Review of Systems  Constitutional: Positive for fatigue.  HENT: Negative for congestion.   Respiratory: Positive for cough and shortness of breath.   Cardiovascular: Negative for chest pain and leg swelling.  Neurological: Negative for dizziness and syncope.  Hematological: Negative for adenopathy.       Objective:   Physical Exam  Constitutional: She appears well-developed and well-nourished.  HENT:  Mouth/Throat: Oropharynx is clear and moist.  Neck: Neck supple. No thyromegaly present.  Cardiovascular: Normal rate.   Pulmonary/Chest: Effort normal and breath sounds normal. No respiratory distress. She has no wheezes. She has no rales.  Musculoskeletal: She exhibits no edema.  Lymphadenopathy:    She has no cervical adenopathy.          Assessment & Plan:  Recent influenza. Patient had persistent coughing with  no recent pneumonia on x-ray. Finish out Ceftin and prednisone. She is showing signs of recovery with no fever. Her pulse oximetry is 96% today room air. We have suggested she return in one month when cough is resolved for spirometry to gauge whether she has COPD and whether she may benefit for chronic COPD inhaler to help her with dyspnea.  Hypokalemia with potassium 3.1 on recent lab. Repeat basic metabolic panel. Takes regular supplement daily

## 2013-12-13 LAB — BASIC METABOLIC PANEL
BUN: 18 mg/dL (ref 6–23)
CHLORIDE: 100 meq/L (ref 96–112)
CO2: 29 mEq/L (ref 19–32)
Calcium: 9.3 mg/dL (ref 8.4–10.5)
Creatinine, Ser: 0.9 mg/dL (ref 0.4–1.2)
GFR: 64.63 mL/min (ref >60.00)
Glucose, Bld: 141 mg/dL — ABNORMAL HIGH (ref 70–99)
POTASSIUM: 3.9 meq/L (ref 3.5–5.1)
Sodium: 140 mEq/L (ref 135–145)

## 2013-12-31 ENCOUNTER — Ambulatory Visit (INDEPENDENT_AMBULATORY_CARE_PROVIDER_SITE_OTHER): Payer: Medicare Other | Admitting: General Practice

## 2013-12-31 ENCOUNTER — Other Ambulatory Visit: Payer: Self-pay | Admitting: General Practice

## 2013-12-31 DIAGNOSIS — Z5181 Encounter for therapeutic drug level monitoring: Secondary | ICD-10-CM

## 2013-12-31 DIAGNOSIS — I4891 Unspecified atrial fibrillation: Secondary | ICD-10-CM

## 2013-12-31 DIAGNOSIS — Z7901 Long term (current) use of anticoagulants: Secondary | ICD-10-CM

## 2013-12-31 LAB — POCT INR: INR: 4.7

## 2013-12-31 MED ORDER — WARFARIN SODIUM 2 MG PO TABS: ORAL_TABLET | ORAL | Status: DC

## 2013-12-31 NOTE — Progress Notes (Signed)
Pre visit review using our clinic review tool, if applicable. No additional management support is needed unless otherwise documented below in the visit note. 

## 2014-01-08 ENCOUNTER — Other Ambulatory Visit: Payer: Self-pay

## 2014-01-08 DIAGNOSIS — Z1231 Encounter for screening mammogram for malignant neoplasm of breast: Secondary | ICD-10-CM

## 2014-01-17 ENCOUNTER — Other Ambulatory Visit: Payer: Self-pay | Admitting: Family Medicine

## 2014-01-17 NOTE — Telephone Encounter (Signed)
Refill for 3 months. 

## 2014-01-17 NOTE — Telephone Encounter (Signed)
Last visit 12/12/13 Last refill 10/17/13 #30 2 refill

## 2014-01-18 ENCOUNTER — Other Ambulatory Visit: Payer: Self-pay

## 2014-01-18 MED ORDER — DIAZEPAM 5 MG PO TABS: ORAL_TABLET | ORAL | Status: DC

## 2014-01-21 ENCOUNTER — Ambulatory Visit (INDEPENDENT_AMBULATORY_CARE_PROVIDER_SITE_OTHER): Payer: Medicare Other | Admitting: General Practice

## 2014-01-21 DIAGNOSIS — I4891 Unspecified atrial fibrillation: Secondary | ICD-10-CM

## 2014-01-21 DIAGNOSIS — Z7901 Long term (current) use of anticoagulants: Secondary | ICD-10-CM

## 2014-01-21 DIAGNOSIS — Z5181 Encounter for therapeutic drug level monitoring: Secondary | ICD-10-CM

## 2014-01-21 LAB — POCT INR: INR: 1.8

## 2014-01-21 NOTE — Progress Notes (Signed)
Pre visit review using our clinic review tool, if applicable. No additional management support is needed unless otherwise documented below in the visit note. 

## 2014-02-12 ENCOUNTER — Ambulatory Visit
Admission: RE | Admit: 2014-02-12 | Discharge: 2014-02-12 | Disposition: A | Discharge status: Home / Self Care | Payer: Medicare Other | Source: Ambulatory Visit

## 2014-02-12 ENCOUNTER — Ambulatory Visit (INDEPENDENT_AMBULATORY_CARE_PROVIDER_SITE_OTHER): Payer: Medicare Other | Admitting: General Practice

## 2014-02-12 ENCOUNTER — Encounter (INDEPENDENT_AMBULATORY_CARE_PROVIDER_SITE_OTHER): Payer: Self-pay

## 2014-02-12 ENCOUNTER — Telehealth: Payer: Self-pay | Admitting: Cardiology

## 2014-02-12 DIAGNOSIS — Z1231 Encounter for screening mammogram for malignant neoplasm of breast: Secondary | ICD-10-CM

## 2014-02-12 DIAGNOSIS — Z5181 Encounter for therapeutic drug level monitoring: Secondary | ICD-10-CM

## 2014-02-12 DIAGNOSIS — Z7901 Long term (current) use of anticoagulants: Secondary | ICD-10-CM

## 2014-02-12 DIAGNOSIS — I4891 Unspecified atrial fibrillation: Secondary | ICD-10-CM

## 2014-02-12 LAB — POCT INR: INR: 1.7

## 2014-02-12 NOTE — Telephone Encounter (Signed)
Patient had flu in March and ended up in the hospital a few days later. Since then she states she just has not been able to get her strength back. Advised patient to contact her PCP for follow up, verbalized understanding. If PCP feels cardiac issues to call back and will get her an appointment Did schedule her 6 month ov in June

## 2014-02-12 NOTE — Telephone Encounter (Signed)
New message     Talk to [facility] about potassium

## 2014-02-12 NOTE — Progress Notes (Signed)
Pre visit review using our clinic review tool, if applicable. No additional management support is needed unless otherwise documented below in the visit note. 

## 2014-02-14 ENCOUNTER — Ambulatory Visit: Payer: Medicare Other

## 2014-02-20 ENCOUNTER — Other Ambulatory Visit: Payer: Self-pay | Admitting: Cardiology

## 2014-02-22 ENCOUNTER — Encounter: Payer: Self-pay | Admitting: Family Medicine

## 2014-02-22 ENCOUNTER — Ambulatory Visit (INDEPENDENT_AMBULATORY_CARE_PROVIDER_SITE_OTHER): Payer: Medicare Other | Admitting: Family Medicine

## 2014-02-22 VITALS — BP 130/80 | HR 60 | Wt 147.0 lb

## 2014-02-22 DIAGNOSIS — R5381 Other malaise: Secondary | ICD-10-CM

## 2014-02-22 DIAGNOSIS — R0600 Dyspnea, unspecified: Secondary | ICD-10-CM

## 2014-02-22 DIAGNOSIS — R7402 Elevation of levels of lactic acid dehydrogenase (LDH): Secondary | ICD-10-CM

## 2014-02-22 DIAGNOSIS — R8290 Unspecified abnormal findings in urine: Secondary | ICD-10-CM

## 2014-02-22 DIAGNOSIS — R300 Dysuria: Secondary | ICD-10-CM

## 2014-02-22 DIAGNOSIS — R82998 Other abnormal findings in urine: Secondary | ICD-10-CM

## 2014-02-22 DIAGNOSIS — R0989 Other specified symptoms and signs involving the circulatory and respiratory systems: Secondary | ICD-10-CM

## 2014-02-22 DIAGNOSIS — R202 Paresthesia of skin: Secondary | ICD-10-CM

## 2014-02-22 DIAGNOSIS — J449 Chronic obstructive pulmonary disease, unspecified: Secondary | ICD-10-CM

## 2014-02-22 DIAGNOSIS — R7401 Elevation of levels of liver transaminase levels: Secondary | ICD-10-CM

## 2014-02-22 DIAGNOSIS — R740 Nonspecific elevation of levels of transaminase and lactic acid dehydrogenase [LDH]: Secondary | ICD-10-CM

## 2014-02-22 DIAGNOSIS — R0609 Other forms of dyspnea: Secondary | ICD-10-CM

## 2014-02-22 DIAGNOSIS — R5383 Other fatigue: Principal | ICD-10-CM

## 2014-02-22 DIAGNOSIS — R209 Unspecified disturbances of skin sensation: Secondary | ICD-10-CM

## 2014-02-22 LAB — CBC WITH DIFFERENTIAL/PLATELET
BASOS PCT: 0.4 % (ref 0.0–3.0)
Basophils Absolute: 0 10*3/uL (ref 0.0–0.1)
EOS PCT: 2.1 % (ref 0.0–5.0)
Eosinophils Absolute: 0.2 10*3/uL (ref 0.0–0.7)
HCT: 42 % (ref 36.0–46.0)
HEMOGLOBIN: 14.3 g/dL (ref 12.0–15.0)
LYMPHS PCT: 26.7 % (ref 12.0–46.0)
Lymphs Abs: 1.9 10*3/uL (ref 0.7–4.0)
MCHC: 34 g/dL (ref 30.0–36.0)
MCV: 90.4 fl (ref 78.0–100.0)
Monocytes Absolute: 0.5 10*3/uL (ref 0.1–1.0)
Monocytes Relative: 7.6 % (ref 3.0–12.0)
NEUTROS ABS: 4.5 10*3/uL (ref 1.4–7.7)
NEUTROS PCT: 63.2 % (ref 43.0–77.0)
Platelets: 259 10*3/uL (ref 150.0–400.0)
RBC: 4.64 Mil/uL (ref 3.87–5.11)
RDW: 14.1 % (ref 11.5–15.5)
WBC: 7.1 10*3/uL (ref 4.0–10.5)

## 2014-02-22 LAB — POCT URINALYSIS DIPSTICK
Bilirubin, UA: NEGATIVE
Blood, UA: NEGATIVE
Glucose, UA: NEGATIVE
Ketones, UA: NEGATIVE
LEUKOCYTES UA: NEGATIVE
NITRITE UA: NEGATIVE
PH UA: 7.5
Protein, UA: NEGATIVE
Spec Grav, UA: 1.015
UROBILINOGEN UA: 0.2

## 2014-02-22 LAB — COMPREHENSIVE METABOLIC PANEL
ALBUMIN: 4.5 g/dL (ref 3.5–5.2)
ALT: 20 U/L (ref 0–35)
AST: 27 U/L (ref 0–37)
Alkaline Phosphatase: 54 U/L (ref 39–117)
BUN: 13 mg/dL (ref 6–23)
CALCIUM: 9.8 mg/dL (ref 8.4–10.5)
CHLORIDE: 99 meq/L (ref 96–112)
CO2: 35 mEq/L — ABNORMAL HIGH (ref 19–32)
Creatinine, Ser: 0.8 mg/dL (ref 0.4–1.2)
GFR: 75.08 mL/min (ref >60.00)
GLUCOSE: 91 mg/dL (ref 70–99)
Potassium: 3.7 mEq/L (ref 3.5–5.1)
Sodium: 142 mEq/L (ref 135–145)
Total Bilirubin: 0.8 mg/dL (ref 0.2–1.2)
Total Protein: 7.1 g/dL (ref 6.0–8.3)

## 2014-02-22 LAB — VITAMIN B12: VITAMIN B 12: 781 pg/mL (ref 211–911)

## 2014-02-22 MED ORDER — TIOTROPIUM BROMIDE MONOHYDRATE 18 MCG IN CAPS: 18.0000 ug | ORAL_CAPSULE | Freq: Every day | RESPIRATORY_TRACT | Status: DC

## 2014-02-22 NOTE — Progress Notes (Signed)
Pre visit review using our clinic review tool, if applicable. No additional management support is needed unless otherwise documented below in the visit note. 

## 2014-02-22 NOTE — Progress Notes (Signed)
Subjective:    Patient ID: Catherine [name], female    DOB: 20-Sep-1935, 79 y.o.   MRN: [id number]  HPI Patient is seen for followup with chief complaint of fatigue She had influenza back in March of this year and feels she has not recovered since then. She denies any fever. She has some chronic dyspnea which is unchanged. No cough. No chest pain. Sleeping okay. No mood disorder. She did have slightly low potassium back in March but follow up level normal. She had mildly elevated liver transaminases. She does complain of odor with her urine but no burning with urination. Thyroid was normal back in November 2014.  She complains of some paresthesias intermittently involving both hands. No weakness. No neck pain. She had B12 this was over 2 years ago that was normal.  She has chronic dyspnea and ex-smoker. We discussed possible spirometry to assess but did not want to get in March because she was having acute illness.  Past Medical History  Diagnosis Date  . Hypertension   . Atrial fibrillation, chronic   . Heart murmur   . Arthritis   . UTI (urinary tract infection)   . Chronic kidney disease   . Valvular heart disease     Has mitral stenosis with prior mitral commissurotomy in 1970  . H/O: rheumatic fever   . Chronic anticoagulation    Past Surgical History  Procedure Laterality Date  . Cardiac catheterization    . Back surgery      neurosurgery  . Commissurotomy  1970  . Other surgical history      hysterectomy  . Cataract surg    . Abdominal hysterectomy  1983    endometriosis    reports that she has quit smoking. She has never used smokeless tobacco. She reports that she does not drink alcohol or use illicit drugs. family history includes Leukemia in her father. Allergies  Allergen Reactions  . Ace Inhibitors Other (See Comments)    unknown  . Aldactone [Spironolactone]     dyspnea  . Benazepril Hcl     cough  . Ciprocin-Fluocin-Procin [Fluocinolone Acetonide] Other  (See Comments)    unknown  . Ciprofloxacin Diarrhea  . Codeine Nausea Only  . Diltiazem     ha  . Flagyl [Metronidazole Hcl] Other (See Comments)    headache  . Flovent [Fluticasone Propionate]     Leg cramps  . Gabapentin Swelling  . Lyrica [Pregabalin] Swelling  . Nitrofurantoin Monohyd Macro     nausea  . Quinidine     Fever diarrhea  . Simvastatin     Leg pain   . Tramadol Nausea Only  . Verapamil     myalgias  . Amoxicillin Palpitations    tachy      Review of Systems  Constitutional: Positive for fatigue. Negative for fever and chills.  Respiratory: Positive for shortness of breath. Negative for cough and wheezing.   Cardiovascular: Negative for chest pain, palpitations and leg swelling.  Gastrointestinal: Negative for nausea, vomiting, abdominal pain and diarrhea.  Neurological: Negative for dizziness and syncope.  Hematological: Negative for adenopathy.       Objective:   Physical Exam  Constitutional: She appears well-developed and well-nourished.  HENT:  Right Ear: External ear normal.  Left Ear: External ear normal.  Mouth/Throat: Oropharynx is clear and moist.  Neck: Neck supple. No thyromegaly present.  Cardiovascular: Normal rate and regular rhythm.   Pulmonary/Chest: Effort normal and breath sounds normal. No respiratory distress. She  has no wheezes. She has no rales.  Abdominal: Soft. She exhibits no mass. There is no tenderness. There is no rebound and no guarding.  Musculoskeletal: She exhibits no edema.  Psychiatric: She has a normal mood and affect. Her behavior is normal.          Assessment & Plan:  #1 fatigue. Likely multifactorial. Nonfocal exam. Check labs with CBC, comprehensive metabolic panel, J68. Recent TSH normal. #2 dysuria. Urine dipstick is completely normal. Reassurance #3 chronic dyspnea. No recent chest pain. Rule out COPD. Obtain spirometry  Spirometry shows moderate obstruction with FEV1/FVC 60%.  We have recommended  trial of Spiriva one inhalation daily.

## 2014-03-04 ENCOUNTER — Ambulatory Visit (INDEPENDENT_AMBULATORY_CARE_PROVIDER_SITE_OTHER): Payer: Medicare Other | Admitting: Cardiology

## 2014-03-04 ENCOUNTER — Encounter: Payer: Self-pay | Admitting: Cardiology

## 2014-03-04 VITALS — BP 152/73 | HR 55 | Ht 65.0 in | Wt 146.0 lb

## 2014-03-04 DIAGNOSIS — E7800 Pure hypercholesterolemia, unspecified: Secondary | ICD-10-CM

## 2014-03-04 DIAGNOSIS — R5381 Other malaise: Secondary | ICD-10-CM

## 2014-03-04 DIAGNOSIS — R5383 Other fatigue: Secondary | ICD-10-CM

## 2014-03-04 DIAGNOSIS — I050 Rheumatic mitral stenosis: Secondary | ICD-10-CM

## 2014-03-04 DIAGNOSIS — I4891 Unspecified atrial fibrillation: Secondary | ICD-10-CM

## 2014-03-04 DIAGNOSIS — I119 Hypertensive heart disease without heart failure: Secondary | ICD-10-CM

## 2014-03-04 LAB — LIPID PANEL
CHOL/HDL RATIO: 4
Cholesterol: 224 mg/dL — ABNORMAL HIGH (ref 0–200)
HDL: 61.7 mg/dL (ref >39.00)
LDL Cholesterol: 138 mg/dL — ABNORMAL HIGH (ref 0–99)
NonHDL: 162.3
TRIGLYCERIDES: 124 mg/dL (ref 0.0–149.0)
VLDL: 24.8 mg/dL (ref 0.0–40.0)

## 2014-03-04 LAB — T4, FREE: Free T4: 0.81 ng/dL (ref 0.60–1.60)

## 2014-03-04 LAB — TSH: TSH: 2.01 u[IU]/mL (ref 0.35–4.50)

## 2014-03-04 NOTE — Assessment & Plan Note (Signed)
Blood pressure has been remaining stable.  Weight is up 4 pounds since last visit.

## 2014-03-04 NOTE — Progress Notes (Signed)
Quick Note:  Please report to patient. The recent labs are stable. Continue same medication and careful diet. Thyroid is normal. Lipids are better. CSD. ______

## 2014-03-04 NOTE — Assessment & Plan Note (Signed)
She has been in permanent atrial fibrillation.  She has not had any TIA or stroke symptoms.  He remains on long-term Coumadin.

## 2014-03-04 NOTE — Patient Instructions (Signed)
Will obtain labs today and call you with the results (TSH,FT4,LP)  Your physician has requested that you have an echocardiogram. Echocardiography is a painless test that uses sound waves to create images of your heart. It provides your doctor with information about the size and shape of your heart and how well your heart's chambers and valves are working. This procedure takes approximately one hour. There are no restrictions for this procedure.  Your physician recommends that you continue on your current medications as directed. Please refer to the Current Medication list given to you today.  Your physician wants you to follow-up in: [place] will receive a reminder letter in the mail two months in advance. If you don't receive a letter, please call our office to schedule the follow-up appointment.

## 2014-03-04 NOTE — Assessment & Plan Note (Signed)
The patient has a history of hypercholesterolemia.  We're updating her fasting lab work today.  She complains of lack of energy we will also check a TSH and a free T4

## 2014-03-04 NOTE — Progress Notes (Signed)
Catherine [name] Date of Birth:  [birth year] [place] [address] [place], [place]  [address] [phone]        Fax   [phone]   History of Present Illness: This pleasant 79 year old woman is seen for a scheduled followup office visit. She has a past history of rheumatic fever and rheumatic heart disease. She has a history of mitral stenosis. In 1970s she underwent mitral commissurotomy by Dr. Bradly Bienenstock.   her last echocardiogram was in 2013 and showed a normal ejection fraction of 60% biatrial enlargement, mild aortic stenosis, mild mitral stenosis with moderate mitral regurgitation, She is in chronic established atrial fibrillation. She is on long-term Coumadin.  Since last visit she has been doing well. She continues to complain of lack of enough energy. She stays physically active however. 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  She complains of lack of energy.  She has a history of hypokalemia and has to take 10 potassium tablets a day to keep her potassium stable.  She has tried to cut back on her furosemide.   Current Outpatient Prescriptions  Medication Sig Dispense Refill  . albuterol (PROVENTIL HFA;VENTOLIN HFA) 108 (90 BASE) MCG/ACT inhaler Inhale 2 puffs into the lungs every 6 (six) hours as needed for wheezing or shortness of breath.  1 Inhaler  0  . atenolol (TENORMIN) 25 MG tablet Take 12.5 mg by mouth daily.      . B Complex-C (B-COMPLEX WITH VITAMIN C) tablet Take 1 tablet by mouth daily.      . calcium-vitamin D (OSCAL WITH D) 250-125 MG-UNIT per tablet Take 1 tablet by mouth 2 (two) times daily.      . diazepam (VALIUM) 5 MG tablet TAKE (1/2) TO (1) TABLET DAILY AS NEEDED.  30 tablet  2  . digoxin (LANOXIN) 0.125 MG tablet Take 0.125 mg by mouth daily.      . furosemide (LASIX) 40 MG tablet Take 40 mg by mouth 2 (two) times daily.      Marland Kitchen guaiFENesin (ROBITUSSIN) 100 MG/5ML liquid Take 5-10 mLs (100-200 mg  total) by mouth every 4 (four) hours as needed for cough.  60 mL  0  . Multiple Vitamin (MULTIVITAMIN) tablet Take 1 tablet by mouth daily.        . ondansetron (ZOFRAN ODT) 8 MG disintegrating tablet Take 1 tablet (8 mg total) by mouth every 8 (eight) hours as needed for nausea or vomiting.  15 tablet  0  . potassium chloride (K-DUR) 10 MEQ tablet Take 20 mEq by mouth 5 (five) times daily.      Marland Kitchen tiotropium (SPIRIVA HANDIHALER) 18 MCG inhalation capsule Place 1 capsule (18 mcg total) into inhaler and inhale daily.  30 capsule  12  . triamterene-hydrochlorothiazide (MAXZIDE-25) 37.5-25 MG per tablet TAKE 1 TABLET DAILY  90 tablet  PRN  . warfarin (COUMADIN) 2 MG tablet Take as directed by anticoagulation clinic  35 tablet  3  . predniSONE (DELTASONE) 20 MG tablet Take 2 tablets (40 mg total) by mouth daily.  10 tablet  0   No current facility-administered medications for this visit.    Allergies  Allergen Reactions  . Ace Inhibitors Other (See Comments)    unknown  . Aldactone [Spironolactone]     dyspnea  . Benazepril Hcl     cough  . Ciprocin-Fluocin-Procin [Fluocinolone Acetonide] Other (See Comments)    unknown  .  Ciprofloxacin Diarrhea  . Codeine Nausea Only  . Diltiazem     ha  . Flagyl [Metronidazole Hcl] Other (See Comments)    headache  . Flovent [Fluticasone Propionate]     Leg cramps  . Gabapentin Swelling  . Lyrica [Pregabalin] Swelling  . Nitrofurantoin Monohyd Macro     nausea  . Quinidine     Fever diarrhea  . Simvastatin     Leg pain   . Tramadol Nausea Only  . Verapamil     myalgias  . Amoxicillin Palpitations    tachy    Patient Active Problem List   Diagnosis Date Noted  . Encounter for therapeutic drug monitoring 10/22/2013  . Osteoarthritis 08/11/2012  . Osteoporosis 06/06/2012  . Encounter for long-term (current) use of anticoagulants 07/21/2011  . Benign hypertensive heart disease without heart failure 04/28/2011  . Hypercholesterolemia  04/28/2011  . Low back pain 01/27/2011  . Atrial fibrillation   . COPD 01/30/2008  . RHEUMATIC MITRAL STENOSIS 01/05/2008  . RHEUMATIC HEART DISEASE 01/05/2008  . VALVULAR HEART DISEASE 01/05/2008  . Atrial fibrillation 01/05/2008  . DYSPNEA 01/04/2008  . COUGH 01/04/2008    History  Smoking status  . Former Smoker  Smokeless tobacco  . Never Used    History  Alcohol Use No    Family History  Problem Relation Age of Onset  . Leukemia Father     Review of Systems: Constitutional: no fever chills diaphoresis or fatigue or change in weight.  Head and neck: no hearing loss, no epistaxis, no photophobia or visual disturbance. Respiratory: No cough, shortness of breath or wheezing. Cardiovascular: No chest pain peripheral edema, palpitations. Gastrointestinal: No abdominal distention, no abdominal pain, no change in bowel habits hematochezia or melena. Genitourinary: No dysuria, no frequency, no urgency, no nocturia. Musculoskeletal:No arthralgias, no back pain, no gait disturbance or myalgias. Neurological: No dizziness, no headaches, no numbness, no seizures, no syncope, no weakness, no tremors. Hematologic: No lymphadenopathy, no easy bruising. Psychiatric: No confusion, no hallucinations, no sleep disturbance.    Physical Exam: Filed Vitals:   03/04/14 0952  BP: 152/73  Pulse: 55   the general appearance reveals a well-developed well-nourished woman in no distress.The head and neck exam reveals pupils equal and reactive.  Extraocular movements are full.  There is no scleral icterus.  The mouth and pharynx are normal.  The neck is supple.  The carotids reveal no bruits.  The jugular venous pressure is normal.  The  thyroid is not enlarged.  There is no lymphadenopathy.  The chest is clear to percussion and auscultation.  There are no rales or rhonchi.  Expansion of the chest is symmetrical.  The precordium is quiet.  The pulse is irregular  The first heart sound is normal.   The second heart sound is physiologically split.  There is a soft apical diastolic rumble.  There is no abnormal lift or heave.  The abdomen is soft and nontender.  The bowel sounds are normal.  The liver and spleen are not enlarged.  There are no abdominal masses.  There are no abdominal bruits.  Extremities reveal good pedal pulses.  There is no phlebitis or edema.  There is no cyanosis or clubbing.  Strength is normal and symmetrical in all extremities.  There is no lateralizing weakness.  There are no sensory deficits.  The skin is warm and dry.  There is no rash.  EKG shows atrial fibrillation with slow ventricular response and widespread ST-T wave abnormalities.  Since last tracing 12/08/13, no significant change  Assessment / Plan: 1.  Rheumatic heart disease with mitral stenosis status post mitral commissurotomy in the 1970s 2. permanent atrial fibrillation 3. hypertensive heart disease without heart failure 4. Hypercholesterolemia 5. malaise and fatigue 6. Cholelithiasis  Plan: Continue same medication.  We will update her echocardiogram.  She'll be rechecked in 6 months for office visit and EKG.

## 2014-03-11 ENCOUNTER — Ambulatory Visit (INDEPENDENT_AMBULATORY_CARE_PROVIDER_SITE_OTHER): Payer: Medicare Other | Admitting: General Practice

## 2014-03-11 DIAGNOSIS — I4891 Unspecified atrial fibrillation: Secondary | ICD-10-CM

## 2014-03-11 DIAGNOSIS — Z7901 Long term (current) use of anticoagulants: Secondary | ICD-10-CM

## 2014-03-11 DIAGNOSIS — Z5181 Encounter for therapeutic drug level monitoring: Secondary | ICD-10-CM

## 2014-03-11 LAB — POCT INR: INR: 2.9

## 2014-03-11 NOTE — Progress Notes (Signed)
Pre visit review using our clinic review tool, if applicable. No additional management support is needed unless otherwise documented below in the visit note. 

## 2014-03-12 ENCOUNTER — Other Ambulatory Visit: Payer: Self-pay | Admitting: Cardiology

## 2014-03-13 ENCOUNTER — Encounter: Payer: Self-pay | Admitting: Family Medicine

## 2014-03-13 ENCOUNTER — Ambulatory Visit (INDEPENDENT_AMBULATORY_CARE_PROVIDER_SITE_OTHER): Payer: Medicare Other | Admitting: Family Medicine

## 2014-03-13 VITALS — BP 130/72 | HR 62 | Temp 98.6°F | Wt 150.0 lb

## 2014-03-13 DIAGNOSIS — R1011 Right upper quadrant pain: Secondary | ICD-10-CM

## 2014-03-13 NOTE — Patient Instructions (Signed)

## 2014-03-13 NOTE — Progress Notes (Signed)
Pre visit review using our clinic review tool, if applicable. No additional management support is needed unless otherwise documented below in the visit note. 

## 2014-03-13 NOTE — Progress Notes (Signed)
Subjective:    Patient ID: Catherine [name], female    DOB: Nov 19, 1934, 79 y.o.   MRN: [id number]  Flank Pain Associated symptoms include abdominal pain. Pertinent negatives include no chest pain, dysuria or fever.   Patient seen pain right upper quadrant. Present for about one month.  She describes achy pain which is relatively constant. Somewhat progressive in severity over the past month. She has known history of gallstone from CT abdomen back in 2010. No clear triggers for pain other than possibly worse with movement. She's taken Tylenol which seems to help slightly. No associated fevers or chills. She's had 3 pound weight gain since last visit here. She has occasional nausea but no vomiting. Bowel movements are normal. No dysuria. No dyspnea. No cough. She also had recent labs including CBC and compress metabolic panel which were unremarkable.  Past Medical History  Diagnosis Date  . Hypertension   . Atrial fibrillation, chronic   . Heart murmur   . Arthritis   . UTI (urinary tract infection)   . Chronic kidney disease   . Valvular heart disease     Has mitral stenosis with prior mitral commissurotomy in 1970  . H/O: rheumatic fever   . Chronic anticoagulation    Past Surgical History  Procedure Laterality Date  . Cardiac catheterization    . Back surgery      neurosurgery  . Commissurotomy  1970  . Other surgical history      hysterectomy  . Cataract surg    . Abdominal hysterectomy  1983    endometriosis    reports that she has quit smoking. She has never used smokeless tobacco. She reports that she does not drink alcohol or use illicit drugs. family history includes Leukemia in her father. Allergies  Allergen Reactions  . Ace Inhibitors Other (See Comments)    unknown  . Aldactone [Spironolactone]     dyspnea  . Benazepril Hcl     cough  . Ciprocin-Fluocin-Procin [Fluocinolone Acetonide] Other (See Comments)    unknown  . Ciprofloxacin Diarrhea  . Codeine  Nausea Only  . Diltiazem     ha  . Flagyl [Metronidazole Hcl] Other (See Comments)    headache  . Flovent [Fluticasone Propionate]     Leg cramps  . Gabapentin Swelling  . Lyrica [Pregabalin] Swelling  . Nitrofurantoin Monohyd Macro     nausea  . Quinidine     Fever diarrhea  . Simvastatin     Leg pain   . Tramadol Nausea Only  . Verapamil     myalgias  . Amoxicillin Palpitations    tachy      Review of Systems  Constitutional: Negative for fever, chills, appetite change and unexpected weight change.  Cardiovascular: Positive for leg swelling. Negative for chest pain.  Gastrointestinal: Positive for nausea and abdominal pain. Negative for vomiting, diarrhea, constipation and blood in stool.  Genitourinary: Positive for flank pain. Negative for dysuria.       Objective:   Physical Exam  Constitutional: She appears well-developed and well-nourished.  Cardiovascular: Normal rate.   Pulmonary/Chest: Effort normal and breath sounds normal. No respiratory distress. She has no wheezes. She has no rales.  Abdominal: Soft. She exhibits no mass. There is tenderness. There is no rebound and no guarding.  Patient has tenderness right upper quadrant. No hepatomegaly. No masses palpated.  Musculoskeletal:  Trace edema feet and ankles bilaterally          Assessment & Plan:  Right  upper quadrant abdominal pain. She has known history of incidental gallstone. In some ways her symptoms are not classic for acute cholecystitis with relatively constant pain. She does not have any indicators of active infection such as fever or chills. Recent labs unremarkable. Set up ultrasound to further evaluate

## 2014-03-18 ENCOUNTER — Encounter (INDEPENDENT_AMBULATORY_CARE_PROVIDER_SITE_OTHER): Payer: Self-pay

## 2014-03-18 ENCOUNTER — Ambulatory Visit
Admission: RE | Admit: 2014-03-18 | Discharge: 2014-03-18 | Disposition: A | Discharge status: Home / Self Care | Payer: Medicare Other | Source: Ambulatory Visit | Attending: Family Medicine | Admitting: Family Medicine

## 2014-03-18 DIAGNOSIS — R1011 Right upper quadrant pain: Secondary | ICD-10-CM

## 2014-03-25 ENCOUNTER — Ambulatory Visit (HOSPITAL_COMMUNITY): Payer: Medicare Other | Attending: Cardiovascular Disease | Admitting: Radiology

## 2014-03-25 DIAGNOSIS — R0602 Shortness of breath: Secondary | ICD-10-CM

## 2014-03-25 DIAGNOSIS — I050 Rheumatic mitral stenosis: Secondary | ICD-10-CM

## 2014-03-25 DIAGNOSIS — I4819 Other persistent atrial fibrillation: Secondary | ICD-10-CM

## 2014-03-25 DIAGNOSIS — I059 Rheumatic mitral valve disease, unspecified: Secondary | ICD-10-CM | POA: Insufficient documentation

## 2014-03-25 DIAGNOSIS — I052 Rheumatic mitral stenosis with insufficiency: Secondary | ICD-10-CM

## 2014-03-25 NOTE — Progress Notes (Signed)
Echocardiogram performed.  

## 2014-03-26 ENCOUNTER — Telehealth: Payer: Self-pay | Admitting: Cardiology

## 2014-03-26 NOTE — Telephone Encounter (Signed)
Message copied by Earvin Hansen on Tue Mar 26, 2014 12:10 PM ------      Message from: Darlin Coco      Created: Mon Mar 25, 2014  1:56 PM       Please report.  The echocardiogram is stable.  The mitral regurgitation is stable.  Left ventricular function remains excellent.  Continue on current therapy. ------

## 2014-03-26 NOTE — Telephone Encounter (Signed)
New message ° ° °Patient returning call back to nurse.  °

## 2014-03-26 NOTE — Telephone Encounter (Signed)
Advised patient of echo  

## 2014-04-09 ENCOUNTER — Other Ambulatory Visit: Payer: Self-pay | Admitting: Cardiology

## 2014-04-10 ENCOUNTER — Ambulatory Visit (INDEPENDENT_AMBULATORY_CARE_PROVIDER_SITE_OTHER): Payer: Medicare Other | Admitting: General Practice

## 2014-04-10 DIAGNOSIS — I4891 Unspecified atrial fibrillation: Secondary | ICD-10-CM

## 2014-04-10 DIAGNOSIS — Z5181 Encounter for therapeutic drug level monitoring: Secondary | ICD-10-CM

## 2014-04-10 DIAGNOSIS — Z7901 Long term (current) use of anticoagulants: Secondary | ICD-10-CM

## 2014-04-10 LAB — POCT INR: INR: 2.5

## 2014-04-10 NOTE — Progress Notes (Signed)
Pre visit review using our clinic review tool, if applicable. No additional management support is needed unless otherwise documented below in the visit note. 

## 2014-04-16 ENCOUNTER — Other Ambulatory Visit: Payer: Self-pay | Admitting: Family Medicine

## 2014-04-16 NOTE — Telephone Encounter (Signed)
Last visit 03/13/14 Last refill 01/18/14 #30 2 refill

## 2014-04-17 NOTE — Telephone Encounter (Signed)
Refill for 3 months. 

## 2014-04-29 ENCOUNTER — Other Ambulatory Visit: Payer: Self-pay | Admitting: Family Medicine

## 2014-05-20 ENCOUNTER — Ambulatory Visit (INDEPENDENT_AMBULATORY_CARE_PROVIDER_SITE_OTHER): Payer: Medicare Other | Admitting: Family

## 2014-05-20 DIAGNOSIS — I4891 Unspecified atrial fibrillation: Secondary | ICD-10-CM

## 2014-05-20 DIAGNOSIS — E876 Hypokalemia: Secondary | ICD-10-CM

## 2014-05-20 DIAGNOSIS — Z5181 Encounter for therapeutic drug level monitoring: Secondary | ICD-10-CM

## 2014-05-20 LAB — BASIC METABOLIC PANEL
BUN: 10 mg/dL (ref 6–23)
CHLORIDE: 101 meq/L (ref 96–112)
CO2: 30 meq/L (ref 19–32)
CREATININE: 0.7 mg/dL (ref 0.4–1.2)
Calcium: 9.5 mg/dL (ref 8.4–10.5)
GFR: 80.91 mL/min (ref >60.00)
Glucose, Bld: 93 mg/dL (ref 70–99)
Potassium: 3.8 mEq/L (ref 3.5–5.1)
SODIUM: 140 meq/L (ref 135–145)

## 2014-05-20 LAB — POCT INR: INR: 2.7

## 2014-05-20 NOTE — Progress Notes (Signed)
Quick Note:  Please report to patient. The recent labs are stable. Continue same medication and careful diet. ______ 

## 2014-05-20 NOTE — Patient Instructions (Signed)
Anticoagulation Dose Instructions as of 05/20/2014     Catherine [name] Tue Wed Thu Fri Sat   New Dose 2 mg 3 mg 2 mg 2 mg 3 mg 2 mg 2 mg    Description       Continue to take 1 tablet every day except take 1 1/2 tablets on Monday/Thursday.  Re-check in 6 weeks.

## 2014-05-27 ENCOUNTER — Ambulatory Visit: Payer: Medicare Other | Admitting: Family

## 2014-05-27 ENCOUNTER — Ambulatory Visit: Payer: Medicare Other

## 2014-06-14 ENCOUNTER — Other Ambulatory Visit: Payer: Self-pay | Admitting: Cardiology

## 2014-07-01 ENCOUNTER — Ambulatory Visit: Payer: Medicare Other

## 2014-07-03 ENCOUNTER — Ambulatory Visit (INDEPENDENT_AMBULATORY_CARE_PROVIDER_SITE_OTHER): Payer: Medicare Other | Admitting: Family

## 2014-07-03 DIAGNOSIS — Z5181 Encounter for therapeutic drug level monitoring: Secondary | ICD-10-CM

## 2014-07-03 LAB — POCT INR: INR: 4

## 2014-07-03 NOTE — Patient Instructions (Signed)
Hold Coumadin today and tomorrow. Then continue to take 1 tablet every day except take 1 1/2 tablets on Monday/Thursday.  Re-check in 3 weeks.  Anticoagulation Dose Instructions as of 07/03/2014     Dorene Grebe Tue Wed Thu Fri Sat   New Dose 2 mg 3 mg 2 mg 2 mg 3 mg 2 mg 2 mg    Description       Hold Coumadin today and tomorrow. Then continue to take 1 tablet every day except take 1 1/2 tablets on Monday/Thursday.  Re-check in 3 weeks.

## 2014-07-16 ENCOUNTER — Other Ambulatory Visit: Payer: Self-pay | Admitting: Family Medicine

## 2014-07-16 NOTE — Telephone Encounter (Signed)
Refill for 6 months. 

## 2014-07-16 NOTE — Telephone Encounter (Signed)
Last visit 03/13/14  Last refill 04/17/14 #30 2 refill

## 2014-07-24 ENCOUNTER — Ambulatory Visit (INDEPENDENT_AMBULATORY_CARE_PROVIDER_SITE_OTHER): Payer: Medicare Other | Admitting: Family

## 2014-07-24 DIAGNOSIS — Z5181 Encounter for therapeutic drug level monitoring: Secondary | ICD-10-CM

## 2014-07-24 LAB — POCT INR: INR: 3.3

## 2014-07-24 NOTE — Patient Instructions (Signed)
Hold Coumadin today. Then continue to take 1 tablet every day except take 1 1/2 tablets on Monday/Thursday.  Re-check in 2 weeks.  Anticoagulation Dose Instructions as of 07/24/2014     Catherine [name] Tue Wed Thu Fri Sat   New Dose 2 mg 3 mg 2 mg 2 mg 3 mg 2 mg 2 mg    Description       Hold Coumadin today. Then continue to take 1 tablet every day except take 1 1/2 tablets on Monday/Thursday.  Re-check in 2 weeks.

## 2014-08-05 ENCOUNTER — Other Ambulatory Visit: Payer: Self-pay | Admitting: Family Medicine

## 2014-08-07 ENCOUNTER — Ambulatory Visit (INDEPENDENT_AMBULATORY_CARE_PROVIDER_SITE_OTHER): Payer: Medicare Other | Admitting: Family

## 2014-08-07 DIAGNOSIS — Z5181 Encounter for therapeutic drug level monitoring: Secondary | ICD-10-CM

## 2014-08-07 DIAGNOSIS — I482 Chronic atrial fibrillation, unspecified: Secondary | ICD-10-CM

## 2014-08-07 LAB — POCT INR: INR: 3.3

## 2014-08-07 NOTE — Patient Instructions (Signed)
Hold Coumadin today. Then continue to take 1 tablet every day except take 1 1/2 tablets on Mondays only. Re-check in 3 weeks.  Anticoagulation Dose Instructions as of 08/07/2014      Catherine [name] Tue Wed Thu Fri Sat   New Dose 2 mg 3 mg 2 mg 2 mg 2 mg 2 mg 2 mg    Description        Hold Coumadin today. Then continue to take 1 tablet every day except take 1 1/2 tablets on Mondays only. Re-check in 3 weeks.

## 2014-08-28 ENCOUNTER — Ambulatory Visit (INDEPENDENT_AMBULATORY_CARE_PROVIDER_SITE_OTHER): Payer: Medicare Other | Admitting: Family

## 2014-08-28 DIAGNOSIS — I482 Chronic atrial fibrillation, unspecified: Secondary | ICD-10-CM

## 2014-08-28 DIAGNOSIS — D649 Anemia, unspecified: Secondary | ICD-10-CM

## 2014-08-28 DIAGNOSIS — Z5181 Encounter for therapeutic drug level monitoring: Secondary | ICD-10-CM

## 2014-08-28 DIAGNOSIS — E785 Hyperlipidemia, unspecified: Secondary | ICD-10-CM

## 2014-08-28 LAB — CBC WITH DIFFERENTIAL/PLATELET
Basophils Absolute: 0 10*3/uL (ref 0.0–0.1)
Basophils Relative: 0.4 % (ref 0.0–3.0)
Eosinophils Absolute: 0.2 10*3/uL (ref 0.0–0.7)
Eosinophils Relative: 2.2 % (ref 0.0–5.0)
HEMATOCRIT: 40.3 % (ref 36.0–46.0)
HEMOGLOBIN: 13.6 g/dL (ref 12.0–15.0)
LYMPHS ABS: 1.9 10*3/uL (ref 0.7–4.0)
Lymphocytes Relative: 27.4 % (ref 12.0–46.0)
MCHC: 33.7 g/dL (ref 30.0–36.0)
MCV: 92.4 fl (ref 78.0–100.0)
MONO ABS: 0.5 10*3/uL (ref 0.1–1.0)
MONOS PCT: 7 % (ref 3.0–12.0)
NEUTROS ABS: 4.4 10*3/uL (ref 1.4–7.7)
Neutrophils Relative %: 63 % (ref 43.0–77.0)
PLATELETS: 235 10*3/uL (ref 150.0–400.0)
RBC: 4.37 Mil/uL (ref 3.87–5.11)
RDW: 13.6 % (ref 11.5–15.5)
WBC: 7 10*3/uL (ref 4.0–10.5)

## 2014-08-28 LAB — POCT INR: INR: 3.3

## 2014-08-28 NOTE — Patient Instructions (Signed)
Today, take 1/2 tab. Then take 1 tablet every day. Recheck in 4 weeks.   Anticoagulation Dose Instructions as of 08/28/2014      Dorene Grebe Tue Wed Thu Fri Sat   New Dose 2 mg 2 mg 2 mg 2 mg 2 mg 2 mg 2 mg    Description        Today, take 1/2 tab. Then take 1 tablet every day. Recheck in 4 weeks.

## 2014-08-29 LAB — LIPID PANEL
Cholesterol: 220 mg/dL — ABNORMAL HIGH (ref 0–200)
HDL: 44.4 mg/dL (ref >39.00)
NONHDL: 175.6
Total CHOL/HDL Ratio: 5
Triglycerides: 203 mg/dL — ABNORMAL HIGH (ref 0.0–149.0)
VLDL: 40.6 mg/dL — ABNORMAL HIGH (ref 0.0–40.0)

## 2014-08-29 LAB — HEPATIC FUNCTION PANEL
ALK PHOS: 56 U/L (ref 39–117)
ALT: 24 U/L (ref 0–35)
AST: 33 U/L (ref 0–37)
Albumin: 4.6 g/dL (ref 3.5–5.2)
BILIRUBIN DIRECT: 0.1 mg/dL (ref 0.0–0.3)
BILIRUBIN TOTAL: 1 mg/dL (ref 0.2–1.2)
Total Protein: 7 g/dL (ref 6.0–8.3)

## 2014-08-29 LAB — LDL CHOLESTEROL, DIRECT: Direct LDL: 134.7 mg/dL

## 2014-09-04 ENCOUNTER — Ambulatory Visit (INDEPENDENT_AMBULATORY_CARE_PROVIDER_SITE_OTHER): Payer: Medicare Other | Admitting: Cardiology

## 2014-09-04 ENCOUNTER — Encounter: Payer: Self-pay | Admitting: Cardiology

## 2014-09-04 ENCOUNTER — Other Ambulatory Visit: Payer: Self-pay | Admitting: Cardiology

## 2014-09-04 VITALS — BP 124/72 | HR 62 | Ht 64.0 in | Wt 145.0 lb

## 2014-09-04 DIAGNOSIS — I38 Endocarditis, valve unspecified: Secondary | ICD-10-CM

## 2014-09-04 DIAGNOSIS — E7800 Pure hypercholesterolemia, unspecified: Secondary | ICD-10-CM

## 2014-09-04 DIAGNOSIS — I482 Chronic atrial fibrillation, unspecified: Secondary | ICD-10-CM

## 2014-09-04 DIAGNOSIS — I119 Hypertensive heart disease without heart failure: Secondary | ICD-10-CM

## 2014-09-04 DIAGNOSIS — I050 Rheumatic mitral stenosis: Secondary | ICD-10-CM

## 2014-09-04 NOTE — Patient Instructions (Signed)
Your physician recommends that you continue on your current medications as directed. Please refer to the Current Medication list given to you today.  Your physician wants you to follow-up in: 6 month ov You will receive a reminder letter in the mail two months in advance. If you don't receive a letter, please call our office to schedule the follow-up appointment.  

## 2014-09-04 NOTE — Assessment & Plan Note (Signed)
Blood pressure is remaining stable on current therapy.  No dizziness or syncope.  His energy level is stable and she still works in her [employer] on a part-time basis

## 2014-09-04 NOTE — Assessment & Plan Note (Signed)
She is on long-term Coumadin.  She has not had any TIA or stroke symptoms

## 2014-09-04 NOTE — Assessment & Plan Note (Signed)
She has not been experiencing any symptoms of congestive heart failure.  She is not having any peripheral edema

## 2014-09-04 NOTE — Progress Notes (Signed)
Catherine [name] Date of Birth:  08-Dec-1934 [place] [address] [place], [place]  [address] [phone]        Fax   [phone]   History of Present Illness: This pleasant 79 year old woman is seen for a scheduled followup office visit. She has a past history of rheumatic fever and rheumatic heart disease. She has a history of mitral stenosis. In 1970s she underwent mitral commissurotomy by Dr. Bradly Bienenstock.   her last echocardiogram was in 2013 and showed a normal ejection fraction of 60% biatrial enlargement, mild aortic stenosis, mild mitral stenosis with moderate mitral regurgitation, She is in chronic established atrial fibrillation. She is on long-term Coumadin.  Since last visit she has been doing well. She continues to complain of lack of enough energy. She stays physically active however. 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  She complains of lack of energy.  She has a history of hypokalemia and has to take 10 potassium tablets a day to keep her potassium stable.  She has tried to cut back on her furosemide.  She had a recent [facility] screening and everything checked out well. She had an echocardiogram on 03/25/14 which showed an ejection fraction of 60-65% with mild aortic insufficiency and moderate to severe mitral regurgitation.   Current Outpatient Prescriptions  Medication Sig Dispense Refill  . albuterol (PROVENTIL HFA;VENTOLIN HFA) 108 (90 BASE) MCG/ACT inhaler Inhale 2 puffs into the lungs every 6 (six) hours as needed for wheezing or shortness of breath. 1 Inhaler 0  . atenolol (TENORMIN) 25 MG tablet Take 12.5 mg by mouth daily.    . B Complex-C (B-COMPLEX WITH VITAMIN C) tablet Take 1 tablet by mouth daily.    . calcium-vitamin D (OSCAL WITH D) 250-125 MG-UNIT per tablet Take 1 tablet by mouth 2 (two) times daily.    . diazepam (VALIUM) 5 MG tablet TAKE 1/2 TO 1 TABLET ONCE DAILY AS NEEDED 30 tablet 5  . digoxin  (LANOXIN) 0.125 MG tablet Take 0.125 mg by mouth daily.    . furosemide (LASIX) 40 MG tablet Take 40 mg by mouth 2 (two) times daily.    . Multiple Vitamin (MULTIVITAMIN) tablet Take 1 tablet by mouth daily.      . potassium chloride (K-DUR) 10 MEQ tablet Take 20 mEq by mouth 5 (five) times daily.    Marland Kitchen tiotropium (SPIRIVA HANDIHALER) 18 MCG inhalation capsule Place 1 capsule (18 mcg total) into inhaler and inhale daily. 30 capsule 12  . triamterene-hydrochlorothiazide (MAXZIDE-25) 37.5-25 MG per tablet TAKE 1 TABLET DAILY 90 tablet PRN  . warfarin (COUMADIN) 2 MG tablet Take as directed by anticoagulation clinic 35 tablet 2   No current facility-administered medications for this visit.    Allergies  Allergen Reactions  . Ace Inhibitors Other (See Comments)    unknown  . Aldactone [Spironolactone]     dyspnea  . Benazepril Hcl     cough  . Ciprocin-Fluocin-Procin [Fluocinolone Acetonide] Other (See Comments)    unknown  . Ciprofloxacin Diarrhea  . Codeine Nausea Only  . Diltiazem     ha  . Flagyl [Metronidazole Hcl] Other (See Comments)    headache  . Flovent [Fluticasone Propionate]     Leg cramps  . Gabapentin Swelling  . Lyrica [Pregabalin] Swelling  . Nitrofurantoin Monohyd Macro     nausea  . Quinidine     Fever diarrhea  . Simvastatin  Leg pain   . Tramadol Nausea Only  . Verapamil     myalgias  . Amoxicillin Palpitations    tachy    Patient Active Problem List   Diagnosis Date Noted  . Encounter for therapeutic drug monitoring 10/22/2013  . Osteoarthritis 08/11/2012  . Osteoporosis 06/06/2012  . Encounter for long-term (current) use of anticoagulants 07/21/2011  . Benign hypertensive heart disease without heart failure 04/28/2011  . Hypercholesterolemia 04/28/2011  . Low back pain 01/27/2011  . Atrial fibrillation   . COPD 01/30/2008  . RHEUMATIC MITRAL STENOSIS 01/05/2008  . RHEUMATIC HEART DISEASE 01/05/2008  . VALVULAR HEART DISEASE 01/05/2008  .  Atrial fibrillation 01/05/2008  . DYSPNEA 01/04/2008  . COUGH 01/04/2008    History  Smoking status  . Former Smoker  Smokeless tobacco  . Never Used    History  Alcohol Use No    Family History  Problem Relation Age of Onset  . Leukemia Father     Review of Systems: Constitutional: no fever chills diaphoresis or fatigue or change in weight.  Head and neck: no hearing loss, no epistaxis, no photophobia or visual disturbance. Respiratory: No cough, shortness of breath or wheezing. Cardiovascular: No chest pain peripheral edema, palpitations. Gastrointestinal: No abdominal distention, no abdominal pain, no change in bowel habits hematochezia or melena. Genitourinary: No dysuria, no frequency, no urgency, no nocturia. Musculoskeletal:No arthralgias, no back pain, no gait disturbance or myalgias. Neurological: No dizziness, no headaches, no numbness, no seizures, no syncope, no weakness, no tremors. Hematologic: No lymphadenopathy, no easy bruising. Psychiatric: No confusion, no hallucinations, no sleep disturbance.    Physical Exam: Filed Vitals:   09/04/14 0920  BP: 124/72  Pulse: 62   the general appearance reveals a well-developed well-nourished woman in no distress.The head and neck exam reveals pupils equal and reactive.  Extraocular movements are full.  There is no scleral icterus.  The mouth and pharynx are normal.  The neck is supple.  The carotids reveal no bruits.  The jugular venous pressure is normal.  The  thyroid is not enlarged.  There is no lymphadenopathy.  The chest is clear to percussion and auscultation.  There are no rales or rhonchi.  Expansion of the chest is symmetrical.  The precordium is quiet.  The pulse is irregular  The first heart sound is normal.  The second heart sound is physiologically split.  There is a soft apical diastolic rumble.  There is no abnormal lift or heave.  The abdomen is soft and nontender.  The bowel sounds are normal.  The liver  and spleen are not enlarged.  There are no abdominal masses.  There are no abdominal bruits.  Extremities reveal good pedal pulses.  There is no phlebitis or edema.  There is no cyanosis or clubbing.  Strength is normal and symmetrical in all extremities.  There is no lateralizing weakness.  There are no sensory deficits.  The skin is warm and dry.  There is no rash.  EKG shows atrial fibrillation with slow ventricular response and widespread ST-T wave abnormalities.  Since last tracing 12/08/13, no significant change  Assessment / Plan: 1.  Rheumatic heart disease with mitral stenosis status post mitral commissurotomy in the 1970s 2. permanent atrial fibrillation 3. hypertensive heart disease without heart failure 4. Hypercholesterolemia 5. malaise and fatigue 6. Cholelithiasis  Plan: Continue same medication.   She'll be rechecked in 6 months for office visit and EKG.

## 2014-09-06 ENCOUNTER — Other Ambulatory Visit: Payer: Self-pay

## 2014-09-06 ENCOUNTER — Other Ambulatory Visit: Payer: Self-pay | Admitting: Cardiology

## 2014-09-06 MED ORDER — POTASSIUM CHLORIDE ER 10 MEQ PO TBCR: EXTENDED_RELEASE_TABLET | ORAL | Status: DC

## 2014-09-16 ENCOUNTER — Other Ambulatory Visit: Payer: Self-pay | Admitting: Cardiology

## 2014-09-25 ENCOUNTER — Ambulatory Visit (INDEPENDENT_AMBULATORY_CARE_PROVIDER_SITE_OTHER): Payer: Medicare Other | Admitting: Family

## 2014-09-25 ENCOUNTER — Encounter: Payer: Self-pay | Admitting: Family Medicine

## 2014-09-25 ENCOUNTER — Ambulatory Visit (INDEPENDENT_AMBULATORY_CARE_PROVIDER_SITE_OTHER): Payer: Medicare Other | Admitting: Family Medicine

## 2014-09-25 VITALS — BP 110/80 | HR 90 | Temp 98.6°F | Ht 64.0 in | Wt 147.0 lb

## 2014-09-25 DIAGNOSIS — J209 Acute bronchitis, unspecified: Secondary | ICD-10-CM

## 2014-09-25 DIAGNOSIS — I482 Chronic atrial fibrillation, unspecified: Secondary | ICD-10-CM

## 2014-09-25 DIAGNOSIS — Z5181 Encounter for therapeutic drug level monitoring: Secondary | ICD-10-CM

## 2014-09-25 LAB — POCT INR: INR: 3.1

## 2014-09-25 MED ORDER — FLUCONAZOLE 150 MG PO TABS: 150.0000 mg | ORAL_TABLET | Freq: Once | ORAL | Status: DC

## 2014-09-25 MED ORDER — AZITHROMYCIN 250 MG PO TABS: ORAL_TABLET | ORAL | Status: DC

## 2014-09-25 MED ORDER — AZITHROMYCIN 250 MG PO TABS: ORAL_TABLET | ORAL | Status: AC

## 2014-09-25 NOTE — Patient Instructions (Signed)
Hold Coumadin today. Then decrease Coumadin dose to 1/2 tab on wednesdays only. All other days, 1 tablet. Recheck in 4 weeks.   Anticoagulation Dose Instructions as of 09/25/2014      Catherine [name] Tue Wed Thu Fri Sat   New Dose 2 mg 2 mg 2 mg 1 mg 2 mg 2 mg 2 mg    Description        Hold Coumadin today. Then decrease Coumadin dose to 1/2 tab on wednesdays only. All other days, 1 tablet. Recheck in 4 weeks.

## 2014-09-25 NOTE — Progress Notes (Signed)
Pre visit review using our clinic review tool, if applicable. No additional management support is needed unless otherwise documented below in the visit note. 

## 2014-09-25 NOTE — Patient Instructions (Signed)

## 2014-09-25 NOTE — Progress Notes (Signed)
   Subjective:    Patient ID: Catherine [name], female    DOB: [birth year], 79 y.o.   MRN: [id number]  HPI Patient seen for acute visit. Onset of cough this past Sunday. Husband recently had similar illness. She's had some nasal congestion. Cough productive of thick yellow mucus. She is taken over-the-counter Mucinex and Tylenol. She has body aches. Increased malaise. No dyspnea. No obvious wheezing. Denies any nausea or vomiting. She is nonsmoker. She has multiple allergies as listed elsewhere.  Past Medical History  Diagnosis Date  . Hypertension   . Atrial fibrillation, chronic   . Heart murmur   . Arthritis   . UTI (urinary tract infection)   . Chronic kidney disease   . Valvular heart disease     Has mitral stenosis with prior mitral commissurotomy in 1970  . H/O: rheumatic fever   . Chronic anticoagulation    Past Surgical History  Procedure Laterality Date  . Cardiac catheterization    . Back surgery      neurosurgery  . Commissurotomy  1970  . Other surgical history      hysterectomy  . Cataract surg    . Abdominal hysterectomy  1983    endometriosis    reports that she has quit smoking. She has never used smokeless tobacco. She reports that she does not drink alcohol or use illicit drugs. family history includes Leukemia in her father. Allergies  Allergen Reactions  . Ace Inhibitors Other (See Comments)    unknown  . Aldactone [Spironolactone]     dyspnea  . Benazepril Hcl     cough  . Ciprocin-Fluocin-Procin [Fluocinolone Acetonide] Other (See Comments)    unknown  . Ciprofloxacin Diarrhea  . Codeine Nausea Only  . Diltiazem     ha  . Flagyl [Metronidazole Hcl] Other (See Comments)    headache  . Flovent [Fluticasone Propionate]     Leg cramps  . Gabapentin Swelling  . Lyrica [Pregabalin] Swelling  . Nitrofurantoin Monohyd Macro     nausea  . Quinidine     Fever diarrhea  . Simvastatin     Leg pain   . Tramadol Nausea Only  . Verapamil     myalgias   . Amoxicillin Palpitations    tachy      Review of Systems  Constitutional: Negative for fever and chills.  HENT: Positive for congestion.   Respiratory: Positive for cough. Negative for shortness of breath and wheezing.        Objective:   Physical Exam  Constitutional: She appears well-developed and well-nourished.  HENT:  Right Ear: External ear normal.  Left Ear: External ear normal.  Mouth/Throat: Oropharynx is clear and moist.  Neck: Neck supple.  Cardiovascular: Normal rate and regular rhythm.   Pulmonary/Chest: Effort normal and breath sounds normal. No respiratory distress. She has no wheezes. She has no rales.  Lymphadenopathy:    She has no cervical adenopathy.          Assessment & Plan:  Acute upper respiratory illness. Suspect acute viral bronchitis. We recommend continue Mucinex. Increase hydration. We wrote prescription for Zithromax to start for any fever or worsening symptoms.

## 2014-09-30 DIAGNOSIS — Z85828 Personal history of other malignant neoplasm of skin: Secondary | ICD-10-CM | POA: Diagnosis not present

## 2014-09-30 DIAGNOSIS — Z08 Encounter for follow-up examination after completed treatment for malignant neoplasm: Secondary | ICD-10-CM | POA: Diagnosis not present

## 2014-10-15 ENCOUNTER — Encounter: Payer: Self-pay | Admitting: Family Medicine

## 2014-10-15 ENCOUNTER — Ambulatory Visit (INDEPENDENT_AMBULATORY_CARE_PROVIDER_SITE_OTHER): Payer: Medicare Other | Admitting: Family Medicine

## 2014-10-15 VITALS — BP 120/80 | Temp 98.3°F | Wt 150.0 lb

## 2014-10-15 DIAGNOSIS — R300 Dysuria: Secondary | ICD-10-CM | POA: Diagnosis not present

## 2014-10-15 LAB — POCT URINALYSIS DIPSTICK
BILIRUBIN UA: NEGATIVE
Glucose, UA: NEGATIVE
KETONES UA: NEGATIVE
Protein, UA: NEGATIVE
Spec Grav, UA: 1.015
UROBILINOGEN UA: 0.2
pH, UA: 7.5

## 2014-10-15 MED ORDER — FLUCONAZOLE 150 MG PO TABS: 150.0000 mg | ORAL_TABLET | Freq: Once | ORAL | Status: DC

## 2014-10-15 MED ORDER — CEPHALEXIN 500 MG PO CAPS: 500.0000 mg | ORAL_CAPSULE | Freq: Three times a day (TID) | ORAL | Status: DC

## 2014-10-15 NOTE — Progress Notes (Signed)
   Subjective:    Patient ID: Catherine [name], female    DOB: 1934-12-12, 80 y.o.   MRN: [id number]  HPI Acute visit. Patient seen with onset yesterday of some increased urine frequency.  She has noticed slight odor to her urine and also some very mild burning. No vaginal discharge. She's had frequent UTIs in the past. Denies any flank pain, nausea, vomiting, or any abdominal pain. She has multiple drug allergies but is taken cephalosporins without difficulty in the past  Past Medical History  Diagnosis Date  . Hypertension   . Atrial fibrillation, chronic   . Heart murmur   . Arthritis   . UTI (urinary tract infection)   . Chronic kidney disease   . Valvular heart disease     Has mitral stenosis with prior mitral commissurotomy in 1970  . H/O: rheumatic fever   . Chronic anticoagulation    Past Surgical History  Procedure Laterality Date  . Cardiac catheterization    . Back surgery      neurosurgery  . Commissurotomy  1970  . Other surgical history      hysterectomy  . Cataract surg    . Abdominal hysterectomy  1983    endometriosis    reports that she has quit smoking. She has never used smokeless tobacco. She reports that she does not drink alcohol or use illicit drugs. family history includes Leukemia in her father. Allergies  Allergen Reactions  . Ace Inhibitors Other (See Comments)    unknown  . Aldactone [Spironolactone]     dyspnea  . Benazepril Hcl     cough  . Ciprocin-Fluocin-Procin [Fluocinolone Acetonide] Other (See Comments)    unknown  . Ciprofloxacin Diarrhea  . Codeine Nausea Only  . Diltiazem     ha  . Flagyl [Metronidazole Hcl] Other (See Comments)    headache  . Flovent [Fluticasone Propionate]     Leg cramps  . Gabapentin Swelling  . Lyrica [Pregabalin] Swelling  . Nitrofurantoin Monohyd Macro     nausea  . Quinidine     Fever diarrhea  . Simvastatin     Leg pain   . Tramadol Nausea Only  . Verapamil     myalgias  . Amoxicillin  Palpitations    tachy      Review of Systems  Constitutional: Negative for fever, chills and appetite change.  Gastrointestinal: Negative for nausea, vomiting, abdominal pain, diarrhea and constipation.  Genitourinary: Positive for dysuria and frequency. Negative for hematuria.  Musculoskeletal: Negative for back pain.  Neurological: Negative for dizziness.       Objective:   Physical Exam  Constitutional: She appears well-developed and well-nourished.  Cardiovascular: Normal rate and regular rhythm.   Pulmonary/Chest: Effort normal and breath sounds normal. No respiratory distress. She has no wheezes. She has no rales.          Assessment & Plan:  Dysuria. Possible UTI. Urine culture sent. Keflex 500 mgs 3 times a day for 7 days pending culture results. Stay well-hydrated.

## 2014-10-15 NOTE — Patient Instructions (Signed)

## 2014-10-18 LAB — URINE CULTURE

## 2014-10-24 ENCOUNTER — Ambulatory Visit: Payer: Medicare Other | Admitting: Family

## 2014-10-30 ENCOUNTER — Ambulatory Visit: Payer: Medicare Other | Admitting: Family

## 2014-11-01 ENCOUNTER — Ambulatory Visit (INDEPENDENT_AMBULATORY_CARE_PROVIDER_SITE_OTHER): Payer: Medicare Other | Admitting: Family

## 2014-11-01 DIAGNOSIS — Z5181 Encounter for therapeutic drug level monitoring: Secondary | ICD-10-CM | POA: Diagnosis not present

## 2014-11-01 LAB — POCT INR: INR: 2.4

## 2014-11-01 NOTE — Patient Instructions (Signed)
Coumadin dose to 1/2 tab on wednesdays only. All other days, 1 tablet. Recheck in 4 weeks.   Anticoagulation Dose Instructions as of 11/01/2014      Dorene Grebe Tue Wed Thu Fri Sat   New Dose 2 mg 2 mg 2 mg 1 mg 2 mg 2 mg 2 mg    Description        Coumadin dose to 1/2 tab on wednesdays only. All other days, 1 tablet. Recheck in 4 weeks.

## 2014-11-16 ENCOUNTER — Other Ambulatory Visit: Payer: Self-pay | Admitting: Family

## 2014-11-16 ENCOUNTER — Other Ambulatory Visit: Payer: Self-pay | Admitting: Cardiology

## 2014-11-19 ENCOUNTER — Other Ambulatory Visit: Payer: Self-pay | Admitting: General Practice

## 2014-11-19 MED ORDER — WARFARIN SODIUM 2 MG PO TABS: ORAL_TABLET | ORAL | Status: DC

## 2014-12-02 ENCOUNTER — Telehealth: Payer: Self-pay | Admitting: Family Medicine

## 2014-12-02 ENCOUNTER — Ambulatory Visit (INDEPENDENT_AMBULATORY_CARE_PROVIDER_SITE_OTHER): Payer: Medicare Other | Admitting: General Practice

## 2014-12-02 DIAGNOSIS — Z5181 Encounter for therapeutic drug level monitoring: Secondary | ICD-10-CM | POA: Diagnosis not present

## 2014-12-02 LAB — POCT INR: INR: 2.2

## 2014-12-02 NOTE — Telephone Encounter (Signed)
Patient states that the Lanoxin was a duplicate on her med list and she was advised to make her doctor aware of the medication duplication.

## 2014-12-02 NOTE — Telephone Encounter (Signed)
2nd Lanoxin is taking out.

## 2014-12-02 NOTE — Progress Notes (Signed)
Pre visit review using our clinic review tool, if applicable. No additional management support is needed unless otherwise documented below in the visit note. 

## 2014-12-30 ENCOUNTER — Ambulatory Visit (INDEPENDENT_AMBULATORY_CARE_PROVIDER_SITE_OTHER): Payer: Medicare Other | Admitting: Internal Medicine

## 2014-12-30 ENCOUNTER — Encounter: Payer: Self-pay | Admitting: Internal Medicine

## 2014-12-30 ENCOUNTER — Ambulatory Visit: Payer: Medicare Other

## 2014-12-30 ENCOUNTER — Ambulatory Visit (INDEPENDENT_AMBULATORY_CARE_PROVIDER_SITE_OTHER): Payer: Medicare Other | Admitting: General Practice

## 2014-12-30 VITALS — BP 120/80 | HR 63 | Temp 98.5°F | Resp 18 | Ht 64.0 in | Wt 145.0 lb

## 2014-12-30 DIAGNOSIS — E7800 Pure hypercholesterolemia, unspecified: Secondary | ICD-10-CM

## 2014-12-30 DIAGNOSIS — Z5181 Encounter for therapeutic drug level monitoring: Secondary | ICD-10-CM

## 2014-12-30 DIAGNOSIS — Z7901 Long term (current) use of anticoagulants: Secondary | ICD-10-CM

## 2014-12-30 DIAGNOSIS — N390 Urinary tract infection, site not specified: Secondary | ICD-10-CM

## 2014-12-30 DIAGNOSIS — R300 Dysuria: Secondary | ICD-10-CM

## 2014-12-30 DIAGNOSIS — I4891 Unspecified atrial fibrillation: Secondary | ICD-10-CM

## 2014-12-30 LAB — POCT INR: INR: 2.2

## 2014-12-30 LAB — POCT URINALYSIS DIPSTICK
Bilirubin, UA: NEGATIVE
Glucose, UA: NEGATIVE
Ketones, UA: NEGATIVE
Nitrite, UA: NEGATIVE
Protein, UA: NEGATIVE
Spec Grav, UA: 1.015
Urobilinogen, UA: 0.2
pH, UA: 7.5

## 2014-12-30 MED ORDER — FLUCONAZOLE 150 MG PO TABS: 150.0000 mg | ORAL_TABLET | Freq: Once | ORAL | Status: DC

## 2014-12-30 MED ORDER — CEPHALEXIN 500 MG PO CAPS: 500.0000 mg | ORAL_CAPSULE | Freq: Three times a day (TID) | ORAL | Status: DC

## 2014-12-30 NOTE — Progress Notes (Signed)
Pre visit review using our clinic review tool, if applicable. No additional management support is needed unless otherwise documented below in the visit note. 

## 2014-12-30 NOTE — Progress Notes (Signed)
Subjective:    Patient ID: Catherine [name], female    DOB: 11-22-1934, 80 y.o.   MRN: [id number]  HPI 80 year old patient who has a history of recurrent UTIs.  She presents with a 24-hour history of urinary frequency, urgency and dysuria.  She also describes some mild low back and suprapubic discomfort. She was last treated for a UTI with cephalexin in January.  Urine culture revealed Escherichia coli that was pansensitive to antibiotics She has multiple sensitivities to antibiotics including Cipro, which was associated with an episode of C. difficile colitis.  She apparently has taken Levaquin in the past without difficulty  Past Medical History  Diagnosis Date  . Hypertension   . Atrial fibrillation, chronic   . Heart murmur   . Arthritis   . UTI (urinary tract infection)   . Chronic kidney disease   . Valvular heart disease     Has mitral stenosis with prior mitral commissurotomy in 1970  . H/O: rheumatic fever   . Chronic anticoagulation     History   Social History  . Marital Status: Married    Spouse Name: N/A  . Number of Children: N/A  . Years of Education: N/A   Occupational History  . Not on file.   Social History Main Topics  . Smoking status: Former Research scientist (life sciences)  . Smokeless tobacco: Never Used  . Alcohol Use: No  . Drug Use: No  . Sexual Activity: Yes   Other Topics Concern  . Not on file   Social History Narrative    Past Surgical History  Procedure Laterality Date  . Cardiac catheterization    . Back surgery      neurosurgery  . Commissurotomy  1970  . Other surgical history      hysterectomy  . Cataract surg    . Abdominal hysterectomy  1983    endometriosis    Family History  Problem Relation Age of Onset  . Leukemia Father     Allergies  Allergen Reactions  . Ace Inhibitors Other (See Comments)    unknown  . Aldactone [Spironolactone]     dyspnea  . Benazepril Hcl     cough  . Ciprocin-Fluocin-Procin [Fluocinolone Acetonide] Other  (See Comments)    unknown  . Ciprofloxacin Diarrhea  . Codeine Nausea Only  . Diltiazem     ha  . Flagyl [Metronidazole Hcl] Other (See Comments)    headache  . Flovent [Fluticasone Propionate]     Leg cramps  . Gabapentin Swelling  . Lyrica [Pregabalin] Swelling  . Nitrofurantoin Monohyd Macro     nausea  . Quinidine     Fever diarrhea  . Simvastatin     Leg pain   . Tramadol Nausea Only  . Verapamil     myalgias  . Amoxicillin Palpitations    tachy    Current Outpatient Prescriptions on File Prior to Visit  Medication Sig Dispense Refill  . albuterol (PROVENTIL HFA;VENTOLIN HFA) 108 (90 BASE) MCG/ACT inhaler Inhale 2 puffs into the lungs every 6 (six) hours as needed for wheezing or shortness of breath. 1 Inhaler 0  . atenolol (TENORMIN) 25 MG tablet TAKE (1/2) TABLET DAILY. 45 tablet 1  . B Complex-C (B-COMPLEX WITH VITAMIN C) tablet Take 1 tablet by mouth daily.    . calcium-vitamin D (OSCAL WITH D) 250-125 MG-UNIT per tablet Take 1 tablet by mouth 2 (two) times daily.    . diazepam (VALIUM) 5 MG tablet TAKE 1/2 TO 1 TABLET ONCE  DAILY AS NEEDED 30 tablet 5  . digoxin (LANOXIN) 0.125 MG tablet TAKE 1 TABLET DAILY 30 tablet 3  . furosemide (LASIX) 80 MG tablet TAKE 1 TABLET TWICE DAILY OR AS DIRECTED (Patient taking differently: TAKE  HALF TABLET TWICE DAILY OR AS DIRECTED) 90 tablet PRN  . Multiple Vitamin (MULTIVITAMIN) tablet Take 1 tablet by mouth daily.      . potassium chloride (K-DUR) 10 MEQ tablet TAKE 2 TABLETS 5 TIMES A DAY 900 tablet 1  . triamterene-hydrochlorothiazide (MAXZIDE-25) 37.5-25 MG per tablet TAKE 1 TABLET DAILY 90 tablet PRN  . warfarin (COUMADIN) 2 MG tablet Take as directed by anticoagulation clinic 35 tablet 3   No current facility-administered medications on file prior to visit.    BP 120/80 mmHg  Pulse 63  Temp(Src) 98.5 F (36.9 C) (Oral)  Resp 18  Ht 5\' 4"  (1.626 m)  Wt 145 lb (65.772 kg)  BMI 24.88 kg/m2  SpO2 96%      Review  of Systems  Genitourinary: Positive for dysuria, urgency and frequency.       Objective:   Physical Exam  Constitutional: She appears well-developed. No distress.  Cardiovascular: Normal rate and regular rhythm.   Pulmonary/Chest: Effort normal and breath sounds normal.  Abdominal: Soft. Bowel sounds are normal.  No CVA tenderness or significant suprapubic discomfort          Assessment & Plan:   Recurrent UTI.  Will retreat with cephalexin.  This was tolerated well.  3 months ago.  In view of her chronic recurrent infections.  Will also check a urine for C&S

## 2014-12-30 NOTE — Patient Instructions (Signed)
Drink as much fluid as you  can tolerate over the next few days  Take your antibiotic as prescribed until ALL of it is gone, but stop if you develop a rash, swelling, or any side effects of the medication.  Contact our office as soon as possible if  there are side effects of the medication.   

## 2015-01-02 LAB — URINE CULTURE: Colony Count: >=100000

## 2015-01-07 ENCOUNTER — Other Ambulatory Visit: Payer: Self-pay | Admitting: Cardiology

## 2015-01-15 ENCOUNTER — Encounter: Payer: Self-pay | Admitting: Family Medicine

## 2015-01-15 ENCOUNTER — Ambulatory Visit (INDEPENDENT_AMBULATORY_CARE_PROVIDER_SITE_OTHER): Payer: Medicare Other | Admitting: Family Medicine

## 2015-01-15 VITALS — BP 120/74 | HR 92 | Temp 99.8°F | Wt 144.0 lb

## 2015-01-15 DIAGNOSIS — R05 Cough: Secondary | ICD-10-CM

## 2015-01-15 DIAGNOSIS — R059 Cough, unspecified: Secondary | ICD-10-CM

## 2015-01-15 MED ORDER — ALBUTEROL SULFATE HFA 108 (90 BASE) MCG/ACT IN AERS: 2.0000 | INHALATION_SPRAY | Freq: Four times a day (QID) | RESPIRATORY_TRACT | Status: DC | PRN

## 2015-01-15 MED ORDER — AZITHROMYCIN 250 MG PO TABS: ORAL_TABLET | ORAL | Status: AC

## 2015-01-15 MED ORDER — FLUCONAZOLE 150 MG PO TABS: 150.0000 mg | ORAL_TABLET | Freq: Once | ORAL | Status: DC

## 2015-01-15 NOTE — Patient Instructions (Signed)
Follow up for any increased shortness of breath or persistent fever

## 2015-01-15 NOTE — Progress Notes (Signed)
Pre visit review using our clinic review tool, if applicable. No additional management support is needed unless otherwise documented below in the visit note. 

## 2015-01-15 NOTE — Progress Notes (Signed)
   Subjective:    Patient ID: Catherine [name], female    DOB: 1934/12/13, 80 y.o.   MRN: [id number]  HPI Acute visit. The patient developed cough and some congestion last week. She now has some hoarseness. She had fever this morning of 100.0. She's had some chills. Mild wheezing off and on. Wheezing improved with albuterol inhaler. Husband with similar symptoms. She has increased malaise. Some nasal congestion. Denies any nausea or vomiting. Patient is nonsmoker.  Past Medical History  Diagnosis Date  . Hypertension   . Atrial fibrillation, chronic   . Heart murmur   . Arthritis   . UTI (urinary tract infection)   . Chronic kidney disease   . Valvular heart disease     Has mitral stenosis with prior mitral commissurotomy in 1970  . H/O: rheumatic fever   . Chronic anticoagulation    Past Surgical History  Procedure Laterality Date  . Cardiac catheterization    . Back surgery      neurosurgery  . Commissurotomy  1970  . Other surgical history      hysterectomy  . Cataract surg    . Abdominal hysterectomy  1983    endometriosis    reports that she has quit smoking. She has never used smokeless tobacco. She reports that she does not drink alcohol or use illicit drugs. family history includes Leukemia in her father. Allergies  Allergen Reactions  . Ace Inhibitors Other (See Comments)    unknown  . Aldactone [Spironolactone]     dyspnea  . Benazepril Hcl     cough  . Ciprocin-Fluocin-Procin [Fluocinolone Acetonide] Other (See Comments)    unknown  . Ciprofloxacin Diarrhea  . Codeine Nausea Only  . Diltiazem     ha  . Flagyl [Metronidazole Hcl] Other (See Comments)    headache  . Flovent [Fluticasone Propionate]     Leg cramps  . Gabapentin Swelling  . Lyrica [Pregabalin] Swelling  . Nitrofurantoin Monohyd Macro     nausea  . Quinidine     Fever diarrhea  . Simvastatin     Leg pain   . Tramadol Nausea Only  . Verapamil     myalgias  . Amoxicillin Palpitations      tachy      Review of Systems  Constitutional: Positive for fever and chills.  HENT: Positive for congestion.   Respiratory: Positive for cough.        Objective:   Physical Exam  Constitutional: She appears well-developed and well-nourished. No distress.  HENT:  Mouth/Throat: Oropharynx is clear and moist.  Neck: Neck supple.  Cardiovascular: Normal rate.   Pulmonary/Chest: Effort normal and breath sounds normal. No respiratory distress. She has no rales.  Musculoskeletal: She exhibits no edema.          Assessment & Plan:  Cough. Patient has nonfocal exam but has developed fever several days into her cough. No rales noted.  Start Zithromax. Consider chest x-ray if fever or cough persist. Follow-up promptly for increased shortness of breath. Refill albuterol inhaler to use as needed.

## 2015-01-27 ENCOUNTER — Ambulatory Visit (INDEPENDENT_AMBULATORY_CARE_PROVIDER_SITE_OTHER): Payer: Medicare Other | Admitting: General Practice

## 2015-01-27 ENCOUNTER — Ambulatory Visit: Payer: Medicare Other

## 2015-01-27 ENCOUNTER — Encounter: Payer: Self-pay | Admitting: Family Medicine

## 2015-01-27 ENCOUNTER — Ambulatory Visit (INDEPENDENT_AMBULATORY_CARE_PROVIDER_SITE_OTHER): Payer: Medicare Other | Admitting: Family Medicine

## 2015-01-27 VITALS — BP 130/80 | HR 62 | Temp 98.2°F | Wt 143.0 lb

## 2015-01-27 DIAGNOSIS — T502X5D Adverse effect of carbonic-anhydrase inhibitors, benzothiadiazides and other diuretics, subsequent encounter: Secondary | ICD-10-CM | POA: Diagnosis not present

## 2015-01-27 DIAGNOSIS — R300 Dysuria: Secondary | ICD-10-CM

## 2015-01-27 DIAGNOSIS — Z7901 Long term (current) use of anticoagulants: Secondary | ICD-10-CM

## 2015-01-27 DIAGNOSIS — I4891 Unspecified atrial fibrillation: Secondary | ICD-10-CM

## 2015-01-27 DIAGNOSIS — Z5181 Encounter for therapeutic drug level monitoring: Secondary | ICD-10-CM

## 2015-01-27 LAB — BASIC METABOLIC PANEL
BUN: 14 mg/dL (ref 6–23)
CHLORIDE: 101 meq/L (ref 96–112)
CO2: 31 meq/L (ref 19–32)
CREATININE: 0.84 mg/dL (ref 0.40–1.20)
Calcium: 9.9 mg/dL (ref 8.4–10.5)
GFR: 69.78 mL/min (ref >60.00)
GLUCOSE: 88 mg/dL (ref 70–99)
POTASSIUM: 3.8 meq/L (ref 3.5–5.1)
SODIUM: 140 meq/L (ref 135–145)

## 2015-01-27 LAB — POCT URINALYSIS DIPSTICK
BILIRUBIN UA: NEGATIVE
Blood, UA: NEGATIVE
Glucose, UA: NEGATIVE
Ketones, UA: NEGATIVE
Leukocytes, UA: NEGATIVE
Nitrite, UA: NEGATIVE
Protein, UA: NEGATIVE
Spec Grav, UA: 1.015
Urobilinogen, UA: 0.2
pH, UA: 7

## 2015-01-27 LAB — POCT INR: INR: 2.6

## 2015-01-27 NOTE — Progress Notes (Signed)
Pre visit review using our clinic review tool, if applicable. No additional management support is needed unless otherwise documented below in the visit note. 

## 2015-01-27 NOTE — Progress Notes (Signed)
   Subjective:    Patient ID: Catherine [name], female    DOB: 1934/12/15, 80 y.o.   MRN: [id number]  HPI Patient seen with some dysuria. She just was treated for UTI with Escherichia coli and finish out antibiotics. She complains starting last week of some suprapubic pressure and frequency somewhat less today. She's been taking over-the-counter Azo-Standard. Denies any fever or chills. She's had at least a couple of other UTIs this year. No flank pain.  She complains of some generalized malaise. She had thyroid function early this year normal. Takes furosemide 40 mg twice a day. Is on potassium replacement  Past Medical History  Diagnosis Date  . Hypertension   . Atrial fibrillation, chronic   . Heart murmur   . Arthritis   . UTI (urinary tract infection)   . Chronic kidney disease   . Valvular heart disease     Has mitral stenosis with prior mitral commissurotomy in 1970  . H/O: rheumatic fever   . Chronic anticoagulation    Past Surgical History  Procedure Laterality Date  . Cardiac catheterization    . Back surgery      neurosurgery  . Commissurotomy  1970  . Other surgical history      hysterectomy  . Cataract surg    . Abdominal hysterectomy  1983    endometriosis    reports that she has quit smoking. She has never used smokeless tobacco. She reports that she does not drink alcohol or use illicit drugs. family history includes Leukemia in her father. Allergies  Allergen Reactions  . Ace Inhibitors Other (See Comments)    unknown  . Aldactone [Spironolactone]     dyspnea  . Benazepril Hcl     cough  . Ciprocin-Fluocin-Procin [Fluocinolone Acetonide] Other (See Comments)    unknown  . Ciprofloxacin Diarrhea  . Codeine Nausea Only  . Diltiazem     ha  . Flagyl [Metronidazole Hcl] Other (See Comments)    headache  . Flovent [Fluticasone Propionate]     Leg cramps  . Gabapentin Swelling  . Lyrica [Pregabalin] Swelling  . Nitrofurantoin Monohyd Macro     nausea    . Quinidine     Fever diarrhea  . Simvastatin     Leg pain   . Tramadol Nausea Only  . Verapamil     myalgias  . Amoxicillin Palpitations    tachy      Review of Systems  Constitutional: Positive for fatigue. Negative for fever and chills.  Respiratory: Negative for shortness of breath.   Cardiovascular: Negative for chest pain.  Genitourinary: Positive for dysuria.       Objective:   Physical Exam  Constitutional: She appears well-developed and well-nourished.  HENT:  Mouth/Throat: Oropharynx is clear and moist.  Cardiovascular: Normal rate and regular rhythm.   Pulmonary/Chest: Effort normal and breath sounds normal. No respiratory distress. She has no wheezes. She has no rales.  Musculoskeletal: She exhibits no edema.          Assessment & Plan:  #1 dysuria. Urine dipstick does not suggest likely UTI. Urine culture obtained with her history of frequent UTIs. Hold antibiotics at this point unless culture positive #2 general fatigue. Check basic metabolic panel with patient on chronic Lasix

## 2015-01-28 ENCOUNTER — Other Ambulatory Visit: Payer: Self-pay | Admitting: Family Medicine

## 2015-01-28 NOTE — Telephone Encounter (Signed)
Last visit 01/27/15 Last refill 07/17/14 #30 5 refill

## 2015-01-29 LAB — URINE CULTURE

## 2015-01-29 NOTE — Telephone Encounter (Signed)
Refill for 6 months. 

## 2015-02-21 ENCOUNTER — Other Ambulatory Visit: Payer: Self-pay

## 2015-02-21 DIAGNOSIS — Z1231 Encounter for screening mammogram for malignant neoplasm of breast: Secondary | ICD-10-CM

## 2015-03-03 ENCOUNTER — Encounter: Payer: Self-pay | Admitting: Cardiology

## 2015-03-03 ENCOUNTER — Ambulatory Visit (INDEPENDENT_AMBULATORY_CARE_PROVIDER_SITE_OTHER): Payer: Medicare Other | Admitting: Cardiology

## 2015-03-03 VITALS — BP 120/70 | HR 52 | Ht 64.0 in | Wt 144.8 lb

## 2015-03-03 DIAGNOSIS — I10 Essential (primary) hypertension: Secondary | ICD-10-CM | POA: Diagnosis not present

## 2015-03-03 DIAGNOSIS — R5381 Other malaise: Secondary | ICD-10-CM

## 2015-03-03 DIAGNOSIS — R5383 Other fatigue: Secondary | ICD-10-CM | POA: Diagnosis not present

## 2015-03-03 LAB — CBC WITH DIFFERENTIAL/PLATELET
Basophils Absolute: 0 10*3/uL (ref 0.0–0.1)
Basophils Relative: 0.4 % (ref 0.0–3.0)
EOS ABS: 0.2 10*3/uL (ref 0.0–0.7)
Eosinophils Relative: 2.5 % (ref 0.0–5.0)
HCT: 42.7 % (ref 36.0–46.0)
Hemoglobin: 14.4 g/dL (ref 12.0–15.0)
LYMPHS ABS: 2 10*3/uL (ref 0.7–4.0)
LYMPHS PCT: 30.4 % (ref 12.0–46.0)
MCHC: 33.8 g/dL (ref 30.0–36.0)
MCV: 91.7 fl (ref 78.0–100.0)
Monocytes Absolute: 0.5 10*3/uL (ref 0.1–1.0)
Monocytes Relative: 8 % (ref 3.0–12.0)
NEUTROS PCT: 58.7 % (ref 43.0–77.0)
Neutro Abs: 3.9 10*3/uL (ref 1.4–7.7)
Platelets: 252 10*3/uL (ref 150.0–400.0)
RBC: 4.66 Mil/uL (ref 3.87–5.11)
RDW: 13.8 % (ref 11.5–15.5)
WBC: 6.6 10*3/uL (ref 4.0–10.5)

## 2015-03-03 LAB — T4, FREE: Free T4: 0.81 ng/dL (ref 0.60–1.60)

## 2015-03-03 LAB — BASIC METABOLIC PANEL
BUN: 13 mg/dL (ref 6–23)
CO2: 34 mEq/L — ABNORMAL HIGH (ref 19–32)
Calcium: 9.8 mg/dL (ref 8.4–10.5)
Chloride: 102 mEq/L (ref 96–112)
Creatinine, Ser: 0.76 mg/dL (ref 0.40–1.20)
GFR: 78.3 mL/min (ref >60.00)
GLUCOSE: 105 mg/dL — AB (ref 70–99)
Potassium: 3.9 mEq/L (ref 3.5–5.1)
Sodium: 142 mEq/L (ref 135–145)

## 2015-03-03 LAB — LIPID PANEL
CHOLESTEROL: 221 mg/dL — AB (ref 0–200)
HDL: 53.2 mg/dL (ref >39.00)
LDL Cholesterol: 136 mg/dL — ABNORMAL HIGH (ref 0–99)
NonHDL: 167.8
TRIGLYCERIDES: 157 mg/dL — AB (ref 0.0–149.0)
Total CHOL/HDL Ratio: 4
VLDL: 31.4 mg/dL (ref 0.0–40.0)

## 2015-03-03 LAB — HEPATIC FUNCTION PANEL
ALT: 21 U/L (ref 0–35)
AST: 27 U/L (ref 0–37)
Albumin: 4.7 g/dL (ref 3.5–5.2)
Alkaline Phosphatase: 69 U/L (ref 39–117)
BILIRUBIN DIRECT: 0.2 mg/dL (ref 0.0–0.3)
BILIRUBIN TOTAL: 0.8 mg/dL (ref 0.2–1.2)
Total Protein: 7.5 g/dL (ref 6.0–8.3)

## 2015-03-03 LAB — TSH: TSH: 2.26 u[IU]/mL (ref 0.35–4.50)

## 2015-03-03 NOTE — Patient Instructions (Signed)
Medication Instructions:  Your physician recommends that you continue on your current medications as directed. Please refer to the Current Medication list given to you today.  Labwork: CBC/TSH/FT4/LP/BMET/HFP  Testing/Procedures: NONE  Follow-Up: Your physician wants you to follow-up in: [place] will receive a reminder letter in the mail two months in advance. If you don't receive a letter, please call our office to schedule the follow-up appointment.

## 2015-03-03 NOTE — Progress Notes (Signed)
Quick Note:  Please report to patient. The recent labs are stable. Continue same medication and careful diet. Thyroid okay ______

## 2015-03-03 NOTE — Progress Notes (Signed)
Cardiology Office Note   Date:  03/03/2015   ID:  Catherine [name], DOB 20-Sep-1935, MRN [id number]  PCP:  [name], MD  Cardiologist: Darlin Coco MD  No chief complaint on file.     History of Present Illness: Catherine [name] is a 80 y.o. female who presents for a six-month follow-up visit.  This pleasant 80 year old woman is seen for a scheduled followup office visit. She has a past history of rheumatic fever and rheumatic heart disease. She has a history of mitral stenosis. In 1970s she underwent mitral commissurotomy by Dr. Bradly Bienenstock. Her last echocardiogram in June 2015 showed an ejection fraction of 60-65%.  There was moderate to severe mitral regurgitation.  There was mild aortic insufficiency.  There was biatrial enlargement.  She is in chronic established atrial fibrillation. She is on long-term Coumadin.  Since last visit she has been doing well. She continues to complain of lack of enough energy. She stays physically active however. 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 She complains of lack of energy. She has a history of hypokalemia and has to take 10 potassium tablets a day to keep her potassium stable. She has tried to cut back on her furosemide. She had a recent [facility] screening and everything checked out well. She has not been experiencing any exertional chest pain.  She continues to walk for exercise on her treadmill at home although she cannot walk as fast as she used to without getting out of breath. Past Medical History  Diagnosis Date  . Hypertension   . Atrial fibrillation, chronic   . Heart murmur   . Arthritis   . UTI (urinary tract infection)   . Chronic kidney disease   . Valvular heart disease     Has mitral stenosis with prior mitral commissurotomy in 1970  . H/O: rheumatic fever   . Chronic anticoagulation     Past Surgical History  Procedure Laterality Date  . Cardiac catheterization    . Back  surgery      neurosurgery  . Commissurotomy  1970  . Other surgical history      hysterectomy  . Cataract surg    . Abdominal hysterectomy  1983    endometriosis     Current Outpatient Prescriptions  Medication Sig Dispense Refill  . ACETYLCARNITINE HCL PO Take 1 capsule by mouth 3 (three) times daily.    Marland Kitchen albuterol (PROVENTIL HFA;VENTOLIN HFA) 108 (90 BASE) MCG/ACT inhaler Inhale 2 puffs into the lungs every 6 (six) hours as needed for wheezing or shortness of breath. 1 Inhaler 0  . atenolol (TENORMIN) 25 MG tablet Take 12.5 mg by mouth daily.    . B Complex-C (B-COMPLEX WITH VITAMIN C) tablet Take 1 tablet by mouth daily.    . calcium-vitamin D (OSCAL WITH D) 250-125 MG-UNIT per tablet Take 1 tablet by mouth 2 (two) times daily.    . diazepam (VALIUM) 5 MG tablet Take 2.5 mg by mouth daily as needed for anxiety (for neuropathy).    . furosemide (LASIX) 80 MG tablet Take 40 mg by mouth 2 (two) times daily.    . Multiple Vitamin (MULTIVITAMIN) tablet Take 1 tablet by mouth daily.      . potassium chloride (K-DUR,KLOR-CON) 10 MEQ tablet Take 20 mEq by mouth 5 (five) times daily.    Marland Kitchen triamterene-hydrochlorothiazide (MAXZIDE-25) 37.5-25 MG per tablet Take 1 tablet by mouth daily.    Marland Kitchen  warfarin (COUMADIN) 2 MG tablet Take as directed by anticoagulation clinic 35 tablet 3   No current facility-administered medications for this visit.    Allergies:   Ace inhibitors; Aldactone; Benazepril hcl; Ciprocin-fluocin-procin; Ciprofloxacin; Codeine; Diltiazem; Flagyl; Flovent; Gabapentin; Lyrica; Nitrofurantoin monohyd macro; Quinidine; Simvastatin; Tramadol; Verapamil; and Amoxicillin    Social History:  The patient  reports that she has quit smoking. She has never used smokeless tobacco. She reports that she does not drink alcohol or use illicit drugs.   Family History:  The patient's family history includes Leukemia in her father.    ROS:  Please see the history of present illness.    Otherwise, review of systems are positive for none.   All other systems are reviewed and negative.    PHYSICAL EXAM: VS:  BP 120/70 mmHg  Pulse 52  Ht 5\' 4"  (1.626 m)  Wt 144 lb 12.8 oz (65.681 kg)  BMI 24.84 kg/m2 , BMI Body mass index is 24.84 kg/(m^2). GEN: Well nourished, well developed, in no acute distress HEENT: normal Neck: no JVD, carotid bruits, or masses Cardiac: Irregularly irregular rhythm.  Grade 2/6 holosystolic murmur of mitral regurgitation at left sternal edge and apex.  No significant peripheral edema. Respiratory:  clear to auscultation bilaterally, normal work of breathing GI: soft, nontender, nondistended, + BS MS: no deformity or atrophy Skin: warm and dry, no rash Neuro:  Strength and sensation are intact Psych: euthymic mood, full affect   EKG:  EKG is ordered today. The ekg ordered today demonstrates atrial fibrillation with slow ventricular response.  Nonspecific ST-T wave changes.  Since prior tracing of 09/04/14, no significant change   Recent Labs: 03/04/2014: TSH 2.01 08/28/2014: ALT 24; Hemoglobin 13.6; Platelets 235.0 01/27/2015: BUN 14; Creatinine 0.84; Potassium 3.8; Sodium 140    Lipid Panel    Component Value Date/Time   CHOL 220* 08/28/2014 1009   TRIG 203.0* 08/28/2014 1009   HDL 44.40 08/28/2014 1009   CHOLHDL 5 08/28/2014 1009   VLDL 40.6* 08/28/2014 1009   LDLCALC 138* 03/04/2014 1101   LDLDIRECT 134.7 08/28/2014 1009      Wt Readings from Last 3 Encounters:  03/03/15 144 lb 12.8 oz (65.681 kg)  01/27/15 143 lb (64.864 kg)  01/15/15 144 lb (65.318 kg)        ASSESSMENT AND PLAN:  1. Rheumatic heart disease with mitral stenosis status post mitral commissurotomy in the 1970s 2. permanent atrial fibrillation 3. hypertensive heart disease without heart failure 4. Hypercholesterolemia 5. malaise and fatigue 6. Cholelithiasis   Current medicines are reviewed at length with the patient today.  The patient does not have  concerns regarding medicines.  The following changes have been made:  no change  Labs/ tests ordered today include:   Orders Placed This Encounter  Procedures  . TSH  . Lipid panel  . Hepatic function panel  . Basic metabolic panel  . CBC with Differential/Platelet  . T4, free  . EKG 12-Lead     Disposition: To evaluate her complained of malaise and fatigue we are getting lab work today including CBC TSH free T4 and lipid panel hepatic function panel and basal metabolic panel. Into new current medication.  Recheck in 6 months for office visit  Signed, Darlin Coco MD 03/03/2015 11:44 AM    [facility], [place], [place]  [address] Phone: [phone]; Fax: [phone]

## 2015-03-04 ENCOUNTER — Telehealth: Payer: Self-pay | Admitting: Cardiology

## 2015-03-04 NOTE — Telephone Encounter (Signed)
No answer on home phone. Will attempt again tomorrow, 6/8.

## 2015-03-04 NOTE — Telephone Encounter (Signed)
New message     Patient calling stating someone called her today for lab work.

## 2015-03-05 NOTE — Telephone Encounter (Signed)
Will forward to Dr. Brackbill's nurse, Melinda, for review and follow up.  

## 2015-03-05 NOTE — Telephone Encounter (Signed)
-----   Message from Darlin Coco, MD sent at 03/03/2015  9:45 PM EDT ----- Please report to patient.  The recent labs are stable. Continue same medication and careful diet. Thyroid okay

## 2015-03-05 NOTE — Telephone Encounter (Signed)
Advised patient of lab results  

## 2015-03-10 ENCOUNTER — Ambulatory Visit
Admission: RE | Admit: 2015-03-10 | Discharge: 2015-03-10 | Disposition: A | Discharge status: Home / Self Care | Payer: Medicare Other | Source: Ambulatory Visit

## 2015-03-10 ENCOUNTER — Ambulatory Visit (INDEPENDENT_AMBULATORY_CARE_PROVIDER_SITE_OTHER): Payer: Medicare Other | Admitting: General Practice

## 2015-03-10 DIAGNOSIS — Z7901 Long term (current) use of anticoagulants: Secondary | ICD-10-CM

## 2015-03-10 DIAGNOSIS — Z1231 Encounter for screening mammogram for malignant neoplasm of breast: Secondary | ICD-10-CM | POA: Diagnosis not present

## 2015-03-10 DIAGNOSIS — Z5181 Encounter for therapeutic drug level monitoring: Secondary | ICD-10-CM | POA: Diagnosis not present

## 2015-03-10 DIAGNOSIS — I4891 Unspecified atrial fibrillation: Secondary | ICD-10-CM

## 2015-03-10 LAB — POCT INR: INR: 2.3

## 2015-03-10 NOTE — Progress Notes (Signed)
Pre visit review using our clinic review tool, if applicable. No additional management support is needed unless otherwise documented below in the visit note. 

## 2015-03-11 ENCOUNTER — Inpatient Hospital Stay (HOSPITAL_COMMUNITY)
Admission: EM | Admit: 2015-03-11 | Discharge: 2015-03-14 | DRG: 189 | Disposition: A | Discharge status: Home / Self Care | Payer: Medicare Other | Attending: Internal Medicine | Admitting: Internal Medicine

## 2015-03-11 ENCOUNTER — Encounter (HOSPITAL_COMMUNITY): Payer: Self-pay | Admitting: Emergency Medicine

## 2015-03-11 ENCOUNTER — Emergency Department (HOSPITAL_COMMUNITY): Payer: Medicare Other

## 2015-03-11 DIAGNOSIS — I482 Chronic atrial fibrillation: Secondary | ICD-10-CM | POA: Diagnosis not present

## 2015-03-11 DIAGNOSIS — I272 Other secondary pulmonary hypertension: Secondary | ICD-10-CM | POA: Diagnosis not present

## 2015-03-11 DIAGNOSIS — R0600 Dyspnea, unspecified: Secondary | ICD-10-CM | POA: Diagnosis not present

## 2015-03-11 DIAGNOSIS — J449 Chronic obstructive pulmonary disease, unspecified: Secondary | ICD-10-CM

## 2015-03-11 DIAGNOSIS — I129 Hypertensive chronic kidney disease with stage 1 through stage 4 chronic kidney disease, or unspecified chronic kidney disease: Secondary | ICD-10-CM | POA: Diagnosis not present

## 2015-03-11 DIAGNOSIS — Z888 Allergy status to other drugs, medicaments and biological substances status: Secondary | ICD-10-CM

## 2015-03-11 DIAGNOSIS — G629 Polyneuropathy, unspecified: Secondary | ICD-10-CM | POA: Diagnosis present

## 2015-03-11 DIAGNOSIS — R9431 Abnormal electrocardiogram [ECG] [EKG]: Secondary | ICD-10-CM

## 2015-03-11 DIAGNOSIS — E7800 Pure hypercholesterolemia, unspecified: Secondary | ICD-10-CM | POA: Diagnosis present

## 2015-03-11 DIAGNOSIS — I4891 Unspecified atrial fibrillation: Secondary | ICD-10-CM | POA: Diagnosis present

## 2015-03-11 DIAGNOSIS — IMO0001 Reserved for inherently not codable concepts without codable children: Secondary | ICD-10-CM | POA: Diagnosis present

## 2015-03-11 DIAGNOSIS — J209 Acute bronchitis, unspecified: Secondary | ICD-10-CM | POA: Diagnosis present

## 2015-03-11 DIAGNOSIS — N189 Chronic kidney disease, unspecified: Secondary | ICD-10-CM | POA: Diagnosis present

## 2015-03-11 DIAGNOSIS — Z87891 Personal history of nicotine dependence: Secondary | ICD-10-CM

## 2015-03-11 DIAGNOSIS — Z7901 Long term (current) use of anticoagulants: Secondary | ICD-10-CM | POA: Diagnosis not present

## 2015-03-11 DIAGNOSIS — Z881 Allergy status to other antibiotic agents status: Secondary | ICD-10-CM

## 2015-03-11 DIAGNOSIS — I340 Nonrheumatic mitral (valve) insufficiency: Secondary | ICD-10-CM | POA: Diagnosis not present

## 2015-03-11 DIAGNOSIS — E876 Hypokalemia: Secondary | ICD-10-CM

## 2015-03-11 DIAGNOSIS — R069 Unspecified abnormalities of breathing: Secondary | ICD-10-CM | POA: Diagnosis not present

## 2015-03-11 DIAGNOSIS — Z806 Family history of leukemia: Secondary | ICD-10-CM

## 2015-03-11 DIAGNOSIS — J441 Chronic obstructive pulmonary disease with (acute) exacerbation: Secondary | ICD-10-CM | POA: Diagnosis not present

## 2015-03-11 DIAGNOSIS — J45909 Unspecified asthma, uncomplicated: Secondary | ICD-10-CM | POA: Diagnosis not present

## 2015-03-11 DIAGNOSIS — R6510 Systemic inflammatory response syndrome (SIRS) of non-infectious origin without acute organ dysfunction: Secondary | ICD-10-CM | POA: Diagnosis not present

## 2015-03-11 DIAGNOSIS — J9601 Acute respiratory failure with hypoxia: Secondary | ICD-10-CM | POA: Diagnosis not present

## 2015-03-11 DIAGNOSIS — I10 Essential (primary) hypertension: Secondary | ICD-10-CM

## 2015-03-11 DIAGNOSIS — R0602 Shortness of breath: Secondary | ICD-10-CM | POA: Diagnosis not present

## 2015-03-11 DIAGNOSIS — E785 Hyperlipidemia, unspecified: Secondary | ICD-10-CM | POA: Diagnosis not present

## 2015-03-11 DIAGNOSIS — J440 Chronic obstructive pulmonary disease with acute lower respiratory infection: Secondary | ICD-10-CM | POA: Diagnosis present

## 2015-03-11 LAB — CBC WITH DIFFERENTIAL/PLATELET
BASOS ABS: 0 10*3/uL (ref 0.0–0.1)
Basophils Relative: 0 % (ref 0–1)
Eosinophils Absolute: 0.1 10*3/uL (ref 0.0–0.7)
Eosinophils Relative: 1 % (ref 0–5)
HCT: 38.9 % (ref 36.0–46.0)
HEMOGLOBIN: 13.2 g/dL (ref 12.0–15.0)
Lymphocytes Relative: 15 % (ref 12–46)
Lymphs Abs: 1.1 10*3/uL (ref 0.7–4.0)
MCH: 30.5 pg (ref 26.0–34.0)
MCHC: 33.9 g/dL (ref 30.0–36.0)
MCV: 89.8 fL (ref 78.0–100.0)
Monocytes Absolute: 0.7 10*3/uL (ref 0.1–1.0)
Monocytes Relative: 9 % (ref 3–12)
Neutro Abs: 5.7 10*3/uL (ref 1.7–7.7)
Neutrophils Relative %: 75 % (ref 43–77)
Platelets: 156 10*3/uL (ref 150–400)
RBC: 4.33 MIL/uL (ref 3.87–5.11)
RDW: 13.5 % (ref 11.5–15.5)
WBC: 7.6 10*3/uL (ref 4.0–10.5)

## 2015-03-11 LAB — BASIC METABOLIC PANEL
Anion gap: 11 (ref 5–15)
BUN: 11 mg/dL (ref 6–20)
CHLORIDE: 100 mmol/L — AB (ref 101–111)
CO2: 30 mmol/L (ref 22–32)
Calcium: 8.8 mg/dL — ABNORMAL LOW (ref 8.9–10.3)
Creatinine, Ser: 0.82 mg/dL (ref 0.44–1.00)
GFR calc Af Amer: >60 mL/min (ref >60)
GFR calc non Af Amer: >60 mL/min (ref >60)
Glucose, Bld: 136 mg/dL — ABNORMAL HIGH (ref 65–99)
Potassium: 3 mmol/L — ABNORMAL LOW (ref 3.5–5.1)
Sodium: 141 mmol/L (ref 135–145)

## 2015-03-11 LAB — TROPONIN I
Troponin I: <0.03 ng/mL (ref <0.031)
Troponin I: <0.03 ng/mL (ref <0.031)

## 2015-03-11 LAB — MAGNESIUM: MAGNESIUM: 2 mg/dL (ref 1.7–2.4)

## 2015-03-11 MED ORDER — POTASSIUM CHLORIDE CRYS ER 20 MEQ PO TBCR
40.0000 meq | EXTENDED_RELEASE_TABLET | Freq: Once | ORAL | Status: AC
  Administered 2015-03-11: 40 meq via ORAL
  Filled 2015-03-11: qty 2

## 2015-03-11 MED ORDER — IPRATROPIUM BROMIDE 0.02 % IN SOLN
0.5000 mg | Freq: Once | RESPIRATORY_TRACT | Status: AC
  Administered 2015-03-11: 0.5 mg via RESPIRATORY_TRACT
  Filled 2015-03-11: qty 2.5

## 2015-03-11 MED ORDER — ACETAMINOPHEN 650 MG RE SUPP: 650.0000 mg | Freq: Four times a day (QID) | RECTAL | Status: DC | PRN

## 2015-03-11 MED ORDER — ALBUTEROL SULFATE (2.5 MG/3ML) 0.083% IN NEBU
5.0000 mg | INHALATION_SOLUTION | Freq: Once | RESPIRATORY_TRACT | Status: AC
  Administered 2015-03-11: 5 mg via RESPIRATORY_TRACT
  Filled 2015-03-11: qty 6

## 2015-03-11 MED ORDER — GUAIFENESIN 100 MG/5ML PO SYRP
200.0000 mg | ORAL_SOLUTION | ORAL | Status: DC | PRN
  Filled 2015-03-11: qty 10

## 2015-03-11 MED ORDER — ACETAMINOPHEN 325 MG PO TABS: 650.0000 mg | ORAL_TABLET | Freq: Four times a day (QID) | ORAL | Status: DC | PRN

## 2015-03-11 MED ORDER — LEVALBUTEROL HCL 0.63 MG/3ML IN NEBU
0.6300 mg | INHALATION_SOLUTION | Freq: Three times a day (TID) | RESPIRATORY_TRACT | Status: DC
  Administered 2015-03-11: 0.63 mg via RESPIRATORY_TRACT
  Filled 2015-03-11 (×3): qty 3

## 2015-03-11 MED ORDER — LEVALBUTEROL HCL 1.25 MG/0.5ML IN NEBU
1.2500 mg | INHALATION_SOLUTION | RESPIRATORY_TRACT | Status: DC | PRN
  Filled 2015-03-11: qty 0.5

## 2015-03-11 MED ORDER — SODIUM CHLORIDE 0.9 % IV BOLUS (SEPSIS)
500.0000 mL | Freq: Once | INTRAVENOUS | Status: AC
  Administered 2015-03-11: 500 mL via INTRAVENOUS

## 2015-03-11 MED ORDER — BENZONATATE 100 MG PO CAPS
200.0000 mg | ORAL_CAPSULE | Freq: Three times a day (TID) | ORAL | Status: DC
  Administered 2015-03-11 – 2015-03-14 (×8): 200 mg via ORAL
  Filled 2015-03-11 (×10): qty 2

## 2015-03-11 MED ORDER — DIGOXIN 125 MCG PO TABS
125.0000 ug | ORAL_TABLET | Freq: Every day | ORAL | Status: DC
  Administered 2015-03-11 – 2015-03-13 (×3): 125 ug via ORAL
  Filled 2015-03-11 (×4): qty 1

## 2015-03-11 MED ORDER — IPRATROPIUM BROMIDE 0.02 % IN SOLN
0.5000 mg | RESPIRATORY_TRACT | Status: DC | PRN
  Filled 2015-03-11 (×2): qty 2.5

## 2015-03-11 MED ORDER — ONDANSETRON HCL 4 MG PO TABS: 4.0000 mg | ORAL_TABLET | Freq: Four times a day (QID) | ORAL | Status: DC | PRN

## 2015-03-11 MED ORDER — DEXTROSE 5 % IV SOLN
500.0000 mg | INTRAVENOUS | Status: DC
  Administered 2015-03-11 – 2015-03-12 (×2): 500 mg via INTRAVENOUS
  Filled 2015-03-11 (×3): qty 500

## 2015-03-11 MED ORDER — WARFARIN SODIUM 2 MG PO TABS
2.0000 mg | ORAL_TABLET | Freq: Once | ORAL | Status: AC
  Administered 2015-03-11: 2 mg via ORAL
  Filled 2015-03-11: qty 1

## 2015-03-11 MED ORDER — SODIUM CHLORIDE 0.9 % IV SOLN: INTRAVENOUS | Status: DC

## 2015-03-11 MED ORDER — LEVALBUTEROL HCL 0.63 MG/3ML IN NEBU
0.6300 mg | INHALATION_SOLUTION | Freq: Four times a day (QID) | RESPIRATORY_TRACT | Status: DC
  Administered 2015-03-12: 0.63 mg via RESPIRATORY_TRACT
  Filled 2015-03-11 (×5): qty 3

## 2015-03-11 MED ORDER — WARFARIN - PHARMACIST DOSING INPATIENT: Freq: Every day | Status: DC

## 2015-03-11 MED ORDER — DIAZEPAM 5 MG PO TABS
2.5000 mg | ORAL_TABLET | Freq: Every day | ORAL | Status: DC | PRN
  Administered 2015-03-11 – 2015-03-13 (×3): 2.5 mg via ORAL
  Filled 2015-03-11 (×3): qty 1

## 2015-03-11 MED ORDER — FLUCONAZOLE 200 MG PO TABS
200.0000 mg | ORAL_TABLET | Freq: Once | ORAL | Status: AC
  Administered 2015-03-11: 200 mg via ORAL
  Filled 2015-03-11: qty 1

## 2015-03-11 MED ORDER — ONDANSETRON HCL 4 MG/2ML IJ SOLN: 4.0000 mg | Freq: Four times a day (QID) | INTRAMUSCULAR | Status: DC | PRN

## 2015-03-11 MED ORDER — ATENOLOL 12.5 MG HALF TABLET
12.5000 mg | ORAL_TABLET | Freq: Every day | ORAL | Status: DC
  Administered 2015-03-11 – 2015-03-14 (×4): 12.5 mg via ORAL
  Filled 2015-03-11 (×4): qty 1

## 2015-03-11 MED ORDER — METHYLPREDNISOLONE SODIUM SUCC 125 MG IJ SOLR
125.0000 mg | Freq: Once | INTRAMUSCULAR | Status: AC
  Administered 2015-03-11: 125 mg via INTRAVENOUS
  Filled 2015-03-11: qty 2

## 2015-03-11 MED ORDER — DOCUSATE SODIUM 100 MG PO CAPS
100.0000 mg | ORAL_CAPSULE | Freq: Two times a day (BID) | ORAL | Status: DC
  Administered 2015-03-11 – 2015-03-14 (×5): 100 mg via ORAL
  Filled 2015-03-11 (×7): qty 1

## 2015-03-11 MED ORDER — METHYLPREDNISOLONE SODIUM SUCC 125 MG IJ SOLR
60.0000 mg | Freq: Four times a day (QID) | INTRAMUSCULAR | Status: DC
  Administered 2015-03-11 – 2015-03-13 (×8): 60 mg via INTRAVENOUS
  Filled 2015-03-11 (×4): qty 0.96
  Filled 2015-03-11: qty 2
  Filled 2015-03-11 (×2): qty 0.96
  Filled 2015-03-11: qty 2
  Filled 2015-03-11 (×2): qty 0.96

## 2015-03-11 MED ORDER — ONE-DAILY MULTI VITAMINS PO TABS
1.0000 | ORAL_TABLET | Freq: Every day | ORAL | Status: DC
  Administered 2015-03-11 – 2015-03-12 (×2): 1 via ORAL
  Filled 2015-03-11 (×5): qty 1

## 2015-03-11 MED ORDER — SODIUM CHLORIDE 0.9 % IJ SOLN
3.0000 mL | Freq: Two times a day (BID) | INTRAMUSCULAR | Status: DC
  Administered 2015-03-11 – 2015-03-14 (×6): 3 mL via INTRAVENOUS

## 2015-03-11 MED ORDER — SODIUM CHLORIDE 0.9 % IV SOLN
INTRAVENOUS | Status: DC
  Administered 2015-03-11: 18:00:00 via INTRAVENOUS

## 2015-03-11 MED ORDER — IPRATROPIUM BROMIDE 0.02 % IN SOLN
0.5000 mg | Freq: Four times a day (QID) | RESPIRATORY_TRACT | Status: DC
  Administered 2015-03-11 – 2015-03-12 (×2): 0.5 mg via RESPIRATORY_TRACT
  Filled 2015-03-11: qty 2.5

## 2015-03-11 MED ORDER — ALUM & MAG HYDROXIDE-SIMETH 200-200-20 MG/5ML PO SUSP: 30.0000 mL | Freq: Four times a day (QID) | ORAL | Status: DC | PRN

## 2015-03-11 NOTE — ED Provider Notes (Signed)
CSN: [id number]     Arrival date & time 03/11/15  0802 History   First MD Initiated Contact with Patient 03/11/15 0804     Chief Complaint  Patient presents with  . Shortness of Breath     (Consider location/radiation/quality/duration/timing/severity/associated sxs/prior Treatment) HPI   Catherine [name] is a 80 y.o. female who presents for evaluation of shortness of breath associated with cough which is nonproductive. She denies fever, chills, nausea or vomiting. She has sensation of occasional palpitations. She feels like her symptoms were aggravated by working in the yard. She has ongoing similar symptoms, with dyspnea on exertion, chronically. She presents for evaluation by EMS. She received a nebulizer during transport. There are no other known modifying factors.   Past Medical History  Diagnosis Date  . Hypertension   . Atrial fibrillation, chronic   . Heart murmur   . Arthritis   . UTI (urinary tract infection)   . Chronic kidney disease   . Valvular heart disease     Has mitral stenosis with prior mitral commissurotomy in 1970  . H/O: rheumatic fever   . Chronic anticoagulation    Past Surgical History  Procedure Laterality Date  . Cardiac catheterization    . Back surgery      neurosurgery  . Commissurotomy  1970  . Other surgical history      hysterectomy  . Cataract surg    . Abdominal hysterectomy  1983    endometriosis   Family History  Problem Relation Age of Onset  . Leukemia Father    History  Substance Use Topics  . Smoking status: Former Research scientist (life sciences)  . Smokeless tobacco: Never Used  . Alcohol Use: No   OB History    No data available     Review of Systems  All other systems reviewed and are negative.     Allergies  Ace inhibitors; Aldactone; Benazepril hcl; Ciprocin-fluocin-procin; Ciprofloxacin; Codeine; Diltiazem; Flagyl; Flovent; Gabapentin; Lyrica; Nitrofurantoin monohyd macro; Quinidine; Simvastatin; Tramadol; Verapamil; and  Amoxicillin  Home Medications   Prior to Admission medications   Medication Sig Start Date End Date Taking? Authorizing Provider  acetaminophen (TYLENOL) 500 MG tablet Take 1,000 mg by mouth every 8 (eight) hours as needed for mild pain or headache.   Yes Historical Provider, MD  ACETYLCARNITINE HCL PO Take 1 capsule by mouth 3 (three) times daily.   Yes Historical Provider, MD  albuterol (PROVENTIL HFA;VENTOLIN HFA) 108 (90 BASE) MCG/ACT inhaler Inhale 2 puffs into the lungs every 6 (six) hours as needed for wheezing or shortness of breath. 01/15/15  Yes [name], MD  atenolol (TENORMIN) 25 MG tablet Take 12.5 mg by mouth daily.   Yes Historical Provider, MD  B Complex-C (B-COMPLEX WITH VITAMIN C) tablet Take 1 tablet by mouth daily.   Yes Historical Provider, MD  calcium-vitamin D (OSCAL WITH D) 250-125 MG-UNIT per tablet Take 1 tablet by mouth 2 (two) times daily.   Yes Historical Provider, MD  diazepam (VALIUM) 5 MG tablet Take 2.5 mg by mouth daily as needed for anxiety (for neuropathy).   Yes Historical Provider, MD  digoxin (LANOXIN) 0.125 MG tablet Take 1 tablet by mouth daily. 03/03/15  Yes Historical Provider, MD  furosemide (LASIX) 80 MG tablet Take 40 mg by mouth 2 (two) times daily.   Yes Historical Provider, MD  Multiple Vitamin (MULTIVITAMIN) tablet Take 1 tablet by mouth daily.     Yes Historical Provider, MD  potassium chloride (K-DUR,KLOR-CON) 10 MEQ tablet Take  20 mEq by mouth 5 (five) times daily.   Yes Historical Provider, MD  triamterene-hydrochlorothiazide (MAXZIDE-25) 37.5-25 MG per tablet Take 1 tablet by mouth daily.   Yes Historical Provider, MD  warfarin (COUMADIN) 2 MG tablet Take as directed by anticoagulation clinic Patient taking differently: Take 1-2 mg by mouth daily at 6 PM. Take as directed by anticoagulation clinic 2 mg all days but Wednesday. On Wednesday take 1 mg 11/19/14  Yes [name], MD   BP 129/72 mmHg  Pulse 139  Temp(Src) 99.1 F  (37.3 C) (Oral)  Resp 16  Ht 5\' 4"  (1.626 m)  Wt 143 lb (64.864 kg)  BMI 24.53 kg/m2  SpO2 95% Physical Exam  Constitutional: She is oriented to person, place, and time. She appears well-developed.  Elderly, frail  HENT:  Head: Normocephalic and atraumatic.  Right Ear: External ear normal.  Left Ear: External ear normal.  Eyes: Conjunctivae and EOM are normal. Pupils are equal, round, and reactive to light.  Neck: Normal range of motion and phonation normal. Neck supple.  Cardiovascular: Normal rate, regular rhythm and normal heart sounds.   Pulmonary/Chest: Effort normal. No respiratory distress. She has wheezes. She has no rales. She exhibits no tenderness and no bony tenderness.  Decrease or rub bilaterally. Generalized expiratory wheezing.  Abdominal: Soft. There is no tenderness.  Musculoskeletal: Normal range of motion. She exhibits no edema or tenderness.  Neurological: She is alert and oriented to person, place, and time. No cranial nerve deficit or sensory deficit. She exhibits normal muscle tone. Coordination normal.  Skin: Skin is warm, dry and intact.  Psychiatric: She has a normal mood and affect. Her behavior is normal. Judgment and thought content normal.  Nursing note and vitals reviewed.   ED Course  Procedures (including critical care time)  08:25- case discussed with on-call STEMI doctor, Dr. Irish Lack. He does not think that this EKG, when compared with her prior EKGs, is consistent with a STEMI.  The patient will be treated symptomatically, and observed. She does not have a history of congestive heart failure. Her last EF was normal.  Medications  albuterol (PROVENTIL) (2.5 MG/3ML) 0.083% nebulizer solution 5 mg (5 mg Nebulization Given 03/11/15 0905)  ipratropium (ATROVENT) nebulizer solution 0.5 mg (0.5 mg Nebulization Given 03/11/15 0905)  methylPREDNISolone sodium succinate (SOLU-MEDROL) 125 mg/2 mL injection 125 mg (125 mg Intravenous Given 03/11/15 0904)   sodium chloride 0.9 % bolus 500 mL (500 mLs Intravenous New Bag/Given 03/11/15 0905)  albuterol (PROVENTIL) (2.5 MG/3ML) 0.083% nebulizer solution 5 mg (5 mg Nebulization Given 03/11/15 1215)  ipratropium (ATROVENT) nebulizer solution 0.5 mg (0.5 mg Nebulization Given 03/11/15 1215)  potassium chloride SA (K-DUR,KLOR-CON) CR tablet 40 mEq (40 mEq Oral Given 03/11/15 1211)    Patient Vitals for the past 24 hrs:  BP Temp Temp src Pulse Resp SpO2 Height Weight  03/11/15 1315 129/72 mmHg - - (!) 139 16 95 % - -  03/11/15 1200 133/63 mmHg - - 82 17 94 % - -  03/11/15 1030 (!) 139/53 mmHg - - (!) 39 20 96 % - -  03/11/15 1000 (!) 124/45 mmHg - - 87 15 95 % - -  03/11/15 0945 (!) 128/47 mmHg - - 69 14 98 % - -  03/11/15 0930 (!) 136/48 mmHg - - 77 14 100 % - -  03/11/15 0926 (!) 136/48 mmHg - - 75 16 100 % - -  03/11/15 0814 - - - - - 95 % 5\' 4"  (  1.626 m) 143 lb (64.864 kg)  03/11/15 0813 144/76 mmHg 99.1 F (37.3 C) Oral 110 20 96 % - -    10:40 AM Reevaluation with update and discussion. After initial assessment and treatment, an updated evaluation reveals she states that she feels better, and continues to deny, chest pain. Lung exam-improved air movement, with generalized wheezing, on expiration. Repeat nebulizer orderedWENTZ,Catherine [name]   12:10- vitals and EKG are improved  13:15- patient's heart rate increased, repeat EKG done, indicates now worsening extreme ache abnormality. Repeat troponin ordered  Consultation with hospitalist, to arrange for admission.   CRITICAL CARE Performed by: Richarda Blade Total critical care time: 40 minutes Critical care time was exclusive of separately billable procedures and treating other patients. Critical care was necessary to treat or prevent imminent or life-threatening deterioration. Critical care was time spent personally by me on the following activities: development of treatment plan with patient and/or surrogate as well as nursing, discussions  with consultants, evaluation of patient's response to treatment, examination of patient, obtaining history from patient or surrogate, ordering and performing treatments and interventions, ordering and review of laboratory studies, ordering and review of radiographic studies, pulse oximetry and re-evaluation of patient's condition.    Labs Review Labs Reviewed  BASIC METABOLIC PANEL - Abnormal; Notable for the following:    Potassium 3.0 (*)    Chloride 100 (*)    Glucose, Bld 136 (*)    Calcium 8.8 (*)    All other components within normal limits  CBC WITH DIFFERENTIAL/PLATELET  TROPONIN I  TROPONIN I    Imaging Review Dg Chest Port 1 View  03/11/2015   CLINICAL DATA:  Shortness of breath.  Wheezing.  Chronic bronchitis.  EXAM: PORTABLE CHEST - 1 VIEW  COMPARISON:  Radiographs dated 12/08/2013 and 11/17/2012 and chest CT dated 07/30/2007  FINDINGS: Heart size and pulmonary vascularity are normal. There is peribronchial thickening. Chronic elevation of the lateral aspect of the left hemidiaphragm.  No infiltrates or effusions.  IMPRESSION: Bronchitic changes.   Electronically Signed   By: Lorriane Shire M.D.   On: 03/11/2015 09:03   Mm Digital Screening Bilateral  03/10/2015   CLINICAL DATA:  Screening.  EXAM: DIGITAL SCREENING BILATERAL MAMMOGRAM WITH CAD  COMPARISON:  Previous exam(s).  ACR Breast Density Category b: There are scattered areas of fibroglandular density.  FINDINGS: There are no findings suspicious for malignancy. Images were processed with CAD.  IMPRESSION: No mammographic evidence of malignancy. A result letter of this screening mammogram will be mailed directly to the patient.  RECOMMENDATION: Screening mammogram in one year. (Code:[id number]-B-01Y)  BI-RADS CATEGORY  1: Negative.   Electronically Signed   By: Pamelia Hoit M.D.   On: 03/10/2015 14:55     EKG Interpretation   Date/Time:  Tuesday March 11 2015 13:14:35 EDT Ventricular Rate:  117 PR Interval:    QRS Duration:  90 QT Interval:  329 QTC Calculation: 459 R Axis:   68 Text Interpretation:  Atrial fibrillation Ventricular premature complex  Anterior infarct, age indeterminate Repol abnrm, severe global ischemia  (LM/MVD) Baseline wander in lead(s) II III aVF Since last tracing of  earlier today heart rate increased and ischemic abnormality has worsened  Confirmed by Eulis Foster  MD, Moxon Messler [phone]) on 03/11/2015 1:26:10 PM       EKG Interpretation  Date/Time:  Tuesday March 11 2015 13:14:35 EDT Ventricular Rate:  117 PR Interval:    QRS Duration: 90 QT Interval:  329 QTC Calculation: 459 R Axis:  68 Text Interpretation:  Atrial fibrillation Ventricular premature complex Anterior infarct, age indeterminate Repol abnrm, severe global ischemia (LM/MVD) Baseline wander in lead(s) II III aVF Since last tracing of earlier today heart rate increased and ischemic abnormality has worsened Confirmed by [facility]  MD, Frona Yost [phone]) on 03/11/2015 1:26:10 PM       EKG Interpretation  Date/Time:  Tuesday March 11 2015 13:14:35 EDT Ventricular Rate:  117 PR Interval:    QRS Duration: 90 QT Interval:  329 QTC Calculation: 459 R Axis:   68 Text Interpretation:  Atrial fibrillation Ventricular premature complex Anterior infarct, age indeterminate Repol abnrm, severe global ischemia (LM/MVD) Baseline wander in lead(s) II III aVF Since last tracing of earlier today heart rate increased and ischemic abnormality has worsened Confirmed by Eulis Foster  MD, Karess Harner ([id number]) on 03/11/2015 1:26:10 PM           MDM   Final diagnoses:  COPD exacerbation  Abnormal ECG    Evaluation consistent with exacerbation of COPD. Tachyarrhythmia secondary to respiratory process. No evidence for ventricular tachycardia, attention, metabolic instability, or suspected sepsis. Patient is symptomatic after treatment,  In ED and will require admission for further evaluation and treatment.  Nursing Notes Reviewed/ Care Coordinated, and agree  without changes. Applicable Imaging Reviewed.  Interpretation of Laboratory Data incorporated into ED treatment  Plan: Admit to telemetry.  Daleen Bo, MD 03/12/15 [phone]

## 2015-03-11 NOTE — ED Notes (Signed)
Pt hr at 140 at current time, a fib, pt reports shortness of breath, denies chest pain. EKG captured and shown to MD Eulis Foster.

## 2015-03-11 NOTE — ED Notes (Signed)
Report given to RN on 3E.

## 2015-03-11 NOTE — ED Notes (Addendum)
Pt to ED via [facility]-- from home with c/o shortness of breath increasing after Saturday when working in her yard. Received 1 treatment Albuteral 5mg  enroute

## 2015-03-11 NOTE — ED Notes (Signed)
Transporting patient to new room assignment. 

## 2015-03-11 NOTE — H&P (Signed)
Patient was seen, examined, treatment plan was discussed with the Physician extender. I have directly reviewed the clinical findings, lab, imaging studies and management of this patient in detail. I have made the necessary changes to the below noted documentation, and agree with the documentation, as recorded by the Physician extender.   Brief narrative: 80 year old female with past medical history of atrial fibrillation on full dose anticoagulation with coumadin, last 2 D ECHO in 02/2014 with EF of 60%, rheumatic heart fever and rheumatic heart disease, hypertension who presented with main concern of progressive shortness of breath over last 2-3 days prior to this admission. Shortness of breath was present at rest and with exertion. She reported using mucinex and albuterol with no significant symptomatic relief. She odes reports subjective fevers at home.No chest pain and no palpitation. In ED, BP was 109/56, T max 100.6 F, HR 139, RR 14-22, oxygen saturation was 92% on Melvin oxygen support. Blood work showed hypokalemia of 3.0, normal troponin level. She was given solumedrol and nebulizer treatment in ED and while she felt better she still had shortness of breath for which reason she was admitted for further evaluation and management of shortness of breath thought to be due to COPD and bronchitis.  Assessment & Plan  Principal Problem: Acute COPD exacerbation / Acute respiratory failure with hypoxia / Acute bronchitis / SIRS - SIRS criteria met on admission with fever, tachycardia, tachypnea. CXR showed bronchitis changes. - Started empiric azithromycin  - Continue solumedrol 60 mg IV Q 6 hours scheduled - Continue xopenex every 6 hours scheduled and every 2 hours as needed for shortness of breath or wheezing - Continue oxygen support via  to keep O2 sats above 90%   Active Problems: Chronic atrial fibrillation - CHADS vasc score 4 - On anticoagulation with coumadin - Rate controlled with  digoxin and atenolol   Hypokalemia - Due to nebulizer treatment - Supplemented    Catherine [name] [facility] [phone]  *For further details please refer to admission note done by physician extender below.   [facility] History and Physical                                                                                    Catherine [name], is a 80 y.o. female  MRN: [id number]   DOB - 30-Mar-1935  Admit Date - 03/11/2015  Outpatient Primary MD for the patient is Catherine Post, MD  Referring MD: Catherine [name]  / ER  With History of -  Past Medical History  Diagnosis Date  . Hypertension   . Atrial fibrillation, chronic   . Heart murmur   . Arthritis   . UTI (urinary tract infection)   . Chronic kidney disease   . Valvular heart disease     Has mitral stenosis with prior mitral commissurotomy in 1970  . H/O: rheumatic fever   . Chronic anticoagulation       Past Surgical History  Procedure Laterality Date  . Cardiac catheterization    . Back surgery      neurosurgery  . Commissurotomy  1970  . Other surgical history      hysterectomy  . Cataract surg    .  Abdominal hysterectomy  1983    endometriosis    in for   Chief Complaint  Patient presents with  . Shortness of Breath     HPI This is a 80 year old female patient with history of asthmatic COPD, hypertension, chronic atrial fibrillation with recurrent awareness of palpitations, moderate mitral regurgitation, and chronic anticoagulation with Coumadin. Patient reports that this past Friday she was outside working in her yard and was exposed to dust in the environment then began having coughing and shortness of breath. Symptoms continue to worsen into Saturday and by last night were significant in that she sought attention at the ER. At home she attempted to treat the symptoms with over-the-counter Mucinex and utilization of her albuterol inhaler but did not have any improvement in symptoms. She does have chronic  tachypalpitations in setting of chronic atrial fibrillation but these are worse than baseline. She's had fever or any old productive cough with red streaks.  In the ER she had low-grade temperature initially 99.1 which went up to 100.6. She was mildly tachycardic with ventricular rate of 110 which would spike up into the 120s 130s with coughing episodes, she was normotensive, and she was otherwise stable on room air. Her initial EKG showed downsloping ST segments in her inferior lateral leads as well as abnormalities in her septal leads but was compared to previous EKGs these are not significantly unremarkable. Her troponins have been negative 2. The EDP has also had the patient's cardiology team/Dr. Irish [name] is due to EKGs. He did not felt the ST segment changes are represented acute ischemic issues. Patient has not had any chest pain or pleuritic pain. At the present time she is primarily complaining of hunger and thirst. He does have a remote history of tobacco abuse quitting in 1977. She also reports chronic lower extremity swelling in the summer months related to her neuropathy which is at baseline.  In the ER she was given Solu-Medrol 125 mg and 2 nebulizer treatments with improvement in her symptoms but no resolution of symptoms. She continued with coughing and shortness of breath. Chest x-ray was unremarkable for for any focal infiltrates. WBCs were normal at 7600, potassium was slightly low at 3.0 but otherwise electrolyte panel was normal, on and has been negative 2 at <0.03 and glucose was 136.   Review of Systems   In addition to the HPI above,  No Headache, changes with Vision or hearing, new weakness, tingling, numbness in any extremity, No problems swallowing food or Liquids, indigestion/reflux No Chest pain, orthopnea  No Abdominal pain, N/V; no melena or hematochezia, no dark tarry stools, Bowel movements are regular, No dysuria, hematuria or flank pain No new skin rashes, lesions,  masses or bruises, No new joints pains-aches No recent weight gain or loss No polyuria, polydypsia or polyphagia,  *A full 10 point Review of Systems was done, except as stated above, all other Review of Systems were negative.  Social History History  Substance Use Topics  . Smoking status: Former Research scientist (life sciences)  . Smokeless tobacco: Never Used  . Alcohol Use: No    Resides at: Private residence  Lives with: Husband  Ambulatory status: Without assistive devices   Family History Family History  Problem Relation Age of Onset  . Leukemia Father      Prior to Admission medications   Medication Sig Start Date End Date Taking? Authorizing Provider  acetaminophen (TYLENOL) 500 MG tablet Take 1,000 mg by mouth every 8 (eight) hours as needed for  mild pain or headache.   Yes Historical Provider, MD  ACETYLCARNITINE HCL PO Take 1 capsule by mouth 3 (three) times daily.   Yes Historical Provider, MD  albuterol (PROVENTIL HFA;VENTOLIN HFA) 108 (90 BASE) MCG/ACT inhaler Inhale 2 puffs into the lungs every 6 (six) hours as needed for wheezing or shortness of breath. 01/15/15  Yes Catherine Post, MD  atenolol (TENORMIN) 25 MG tablet Take 12.5 mg by mouth daily.   Yes Historical Provider, MD  B Complex-C (B-COMPLEX WITH VITAMIN C) tablet Take 1 tablet by mouth daily.   Yes Historical Provider, MD  calcium-vitamin D (OSCAL WITH D) 250-125 MG-UNIT per tablet Take 1 tablet by mouth 2 (two) times daily.   Yes Historical Provider, MD  diazepam (VALIUM) 5 MG tablet Take 2.5 mg by mouth daily as needed for anxiety (for neuropathy).   Yes Historical Provider, MD  digoxin (LANOXIN) 0.125 MG tablet Take 1 tablet by mouth daily. 03/03/15  Yes Historical Provider, MD  furosemide (LASIX) 80 MG tablet Take 40 mg by mouth 2 (two) times daily.   Yes Historical Provider, MD  Multiple Vitamin (MULTIVITAMIN) tablet Take 1 tablet by mouth daily.     Yes Historical Provider, MD  potassium chloride (K-DUR,KLOR-CON) 10  MEQ tablet Take 20 mEq by mouth 5 (five) times daily.   Yes Historical Provider, MD  triamterene-hydrochlorothiazide (MAXZIDE-25) 37.5-25 MG per tablet Take 1 tablet by mouth daily.   Yes Historical Provider, MD  warfarin (COUMADIN) 2 MG tablet Take as directed by anticoagulation clinic Patient taking differently: Take 1-2 mg by mouth daily at 6 PM. Take as directed by anticoagulation clinic 2 mg all days but Wednesday. On Wednesday take 1 mg 11/19/14  Yes Catherine Post, MD    Allergies  Allergen Reactions  . Ace Inhibitors Other (See Comments)    unknown  . Aldactone [Spironolactone]     dyspnea  . Benazepril Hcl     cough  . Ciprocin-Fluocin-Procin [Fluocinolone Acetonide] Other (See Comments)    unknown  . Ciprofloxacin Diarrhea  . Codeine Nausea Only  . Diltiazem     ha  . Flagyl [Metronidazole Hcl] Other (See Comments)    headache  . Flovent [Fluticasone Propionate]     Leg cramps  . Gabapentin Swelling  . Lyrica [Pregabalin] Swelling  . Nitrofurantoin Monohyd Macro     nausea  . Quinidine     Fever diarrhea  . Simvastatin     Leg pain   . Tramadol Nausea Only  . Verapamil     myalgias  . Amoxicillin Palpitations    tachy    Physical Exam  Vitals  Blood pressure 127/67, pulse 93, temperature 99.2 F (37.3 C), temperature source Oral, resp. rate 15, height 5' 4"  (1.626 m), weight 143 lb (64.864 kg), SpO2 96 %.   General:  In no acute distress, appears healthy and well nourished  Psych:  Normal affect, Denies Suicidal or Homicidal ideations, Awake Alert, Oriented X 3. Speech and thought patterns are clear and appropriate, no apparent short term memory deficits  Neuro:   No focal neurological deficits, CN II through XII intact, Strength 5/5 all 4 extremities, Sensation intact all 4 extremities.  ENT:  Ears and Eyes appear Normal, Conjunctivae clear, PER. Moist oral mucosa without erythema or exudates.  Neck:  Supple, No lymphadenopathy  appreciated  Respiratory:  Symmetrical chest wall movement, Good air movement bilaterally, coarse to auscultation. Room Air, currently not experiencing paroxysmal coughing  Cardiac: Irregular, atrial fibrillation  primarily in the low 100s with occasional bursts up into the 120s spontaneously and after coughing, grade 2/6 systolic murmur left sternal border fifth intercostal space, trace bilateral LE edema noted, no JVD, No carotid bruits, peripheral pulses palpable at 2+  Abdomen:  Positive bowel sounds, Soft, Non tender, Non distended,  No masses appreciated, no obvious hepatosplenomegaly  Skin:  No Cyanosis, Normal Skin Turgor, No Skin Rash or Bruise.  Extremities: Symmetrical without obvious trauma or injury,  no effusions.  Data Review  CBC  Recent Labs Lab 03/11/15 0853  WBC 7.6  HGB 13.2  HCT 38.9  PLT 156  MCV 89.8  MCH 30.5  MCHC 33.9  RDW 13.5  LYMPHSABS 1.1  MONOABS 0.7  EOSABS 0.1  BASOSABS 0.0    Chemistries   Recent Labs Lab 03/11/15 0853  NA 141  K 3.0*  CL 100*  CO2 30  GLUCOSE 136*  BUN 11  CREATININE 0.82  CALCIUM 8.8*    estimated creatinine clearance is 49.6 mL/min (by C-G formula based on Cr of 0.82).  No results for input(s): TSH, T4TOTAL, T3FREE, THYROIDAB in the last 72 hours.  Invalid input(s): FREET3  Coagulation profile  Recent Labs Lab 03/10/15  INR 2.3    No results for input(s): DDIMER in the last 72 hours.  Cardiac Enzymes  Recent Labs Lab 03/11/15 0853 03/11/15 1345  TROPONINI <0.03 <0.03    Invalid input(s): POCBNP  Urinalysis    Component Value Date/Time   COLORURINE YELLOW 07/05/2011 Helen 07/05/2011 1449   LABSPEC 1.010 07/05/2011 1449   PHURINE 7.5 07/05/2011 1449   GLUCOSEU NEG 07/05/2011 1449   HGBUR NEG 07/05/2011 1449   BILIRUBINUR neg 01/27/2015 1353   BILIRUBINUR NEG 07/05/2011 1449   KETONESUR NEG 07/05/2011 1449   PROTEINUR neg 01/27/2015 1353   PROTEINUR NEG  07/05/2011 1449   UROBILINOGEN 0.2 01/27/2015 1353   UROBILINOGEN 0.2 07/05/2011 1449   NITRITE neg 01/27/2015 1353   NITRITE NEG 07/05/2011 1449   LEUKOCYTESUR Negative 01/27/2015 1353    Imaging results:   Dg Chest Port 1 View  03/11/2015   CLINICAL DATA:  Shortness of breath.  Wheezing.  Chronic bronchitis.  EXAM: PORTABLE CHEST - 1 VIEW  COMPARISON:  Radiographs dated 12/08/2013 and 11/17/2012 and chest CT dated 07/30/2007  FINDINGS: Heart size and pulmonary vascularity are normal. There is peribronchial thickening. Chronic elevation of the lateral aspect of the left hemidiaphragm.  No infiltrates or effusions.  IMPRESSION: Bronchitic changes.   Electronically Signed   By: Lorriane Shire M.D.   On: 03/11/2015 09:03   Mm Digital Screening Bilateral  03/10/2015   CLINICAL DATA:  Screening.  EXAM: DIGITAL SCREENING BILATERAL MAMMOGRAM WITH CAD  COMPARISON:  Previous exam(s).  ACR Breast Density Category b: There are scattered areas of fibroglandular density.  FINDINGS: There are no findings suspicious for malignancy. Images were processed with CAD.  IMPRESSION: No mammographic evidence of malignancy. A result letter of this screening mammogram will be mailed directly to the patient.  RECOMMENDATION: Screening mammogram in one year. (Code:SM-B-01Y)  BI-RADS CATEGORY  1: Negative.   Electronically Signed   By: Pamelia Hoit M.D.   On: 03/10/2015 14:55     EKG: (Independently reviewed) atrial fibrillation with ventricular rate 69, chronic downsloping ST segments in inferior septal and lateral leads unchanged from previous EKGs, QTC 443 ms   Assessment & Plan  Principal Problem:   COPD with bronchitis/mild exacerbation -Admit to telemetry -Suspect bronchitis primary  driving factor dressed 20 symptoms and less so COPD exacerbation -Since responded to steroid's will give Solu-Medrol 60 mg IV every 6 hours at least for 24 hours and then reevaluate whether steroid's are truly indicated -Begin  Zithromax for atypical organisms -Since patient continues to have issues with tachycardia arrhythmias we'll utilize Xopenex as opposed to [employer] -Continue supportive care with oxygen and incentive spirometry -Provide guaifenesin and Tessalon Perles -She likely mildly volume depleted just related to upper rest for infection so we'll hold diuretics at this juncture  Active Problems:   Moderate mitral regurgitation -No evidence of CHF on chest x-ray so doubt this is contributed to patient's current respiratory symptoms    Atrial fibrillation -Mostly rate controlled occasional bursts of tach arrhythmia associated with nebulizers and coughing -Patient has not had her beta blocker or digoxin today so have ordered while in ER -Monitor on telemetry; if rate control becomes an issue concerning transfer to stepdown and utilize Cardizem infusion -Pharmacy to manage chronic warfarin -CHADVASC = 4    Hypercholesterolemia -Not on statin prior to admission-history of leg pain and passed to simvastatin   Hypokalemia -Oral replete -In setting of atrial fibrillation with recurrent episodes of tachypalpitations suggest keeping potassium greater than or equal to 4.0 -Check magnesium    HTN (hypertension) -Blood pressure well controlled -Since treating suspected by depletion with IV fluids we'll hold Maxzide for now    DVT Prophylaxis: Warfarin  Family Communication:   Husband and other family at bedside  Code Status:  Full code  Condition:  Stable  Discharge disposition: Anticipate discharge back to home with husband wants Restoril symptoms improve and tach arrhythmias better controlled noting recurrent chronic palpitations are not unusual for this patient  Time spent in minutes : 60      ELLIS,ALLISON L. ANP on 03/11/2015 at 3:44 PM  Between 7am to 7pm - Pager - [phone]  After 7pm go to [email or web address] - password [email or web address]  And look for the night coverage person covering me after  hours  [facility]

## 2015-03-11 NOTE — Progress Notes (Signed)
ANTICOAGULATION CONSULT NOTE - Initial Consult  Pharmacy Consult for warfarin Indication: atrial fibrillation  Allergies  Allergen Reactions  . Ace Inhibitors Other (See Comments)    unknown  . Aldactone [Spironolactone]     dyspnea  . Benazepril Hcl     cough  . Ciprocin-Fluocin-Procin [Fluocinolone Acetonide] Other (See Comments)    unknown  . Ciprofloxacin Diarrhea  . Codeine Nausea Only  . Diltiazem     ha  . Flagyl [Metronidazole Hcl] Other (See Comments)    headache  . Flovent [Fluticasone Propionate]     Leg cramps  . Gabapentin Swelling  . Lyrica [Pregabalin] Swelling  . Nitrofurantoin Monohyd Macro     nausea  . Quinidine     Fever diarrhea  . Simvastatin     Leg pain   . Tramadol Nausea Only  . Verapamil     myalgias  . Amoxicillin Palpitations    tachy    Patient Measurements: Height: 5\' 4"  (162.6 cm) Weight: 143 lb (64.864 kg) IBW/kg (Calculated) : 54.7  Vital Signs: Temp: 99.2 F (37.3 C) (06/14 1500) Temp Source: Oral (06/14 0813) BP: 127/48 mmHg (06/14 1550) Pulse Rate: 100 (06/14 1550)  Labs:  Recent Labs  03/10/15 03/11/15 0853 03/11/15 1345  HGB  --  13.2  --   HCT  --  38.9  --   PLT  --  156  --   INR 2.3  --   --   CREATININE  --  0.82  --   TROPONINI  --  <0.03 <0.03    Estimated Creatinine Clearance: 49.6 mL/min (by C-G formula based on Cr of 0.82).   Medical History: Past Medical History  Diagnosis Date  . Hypertension   . Atrial fibrillation, chronic   . Heart murmur   . Arthritis   . UTI (urinary tract infection)   . Chronic kidney disease   . Valvular heart disease     Has mitral stenosis with prior mitral commissurotomy in 1970  . H/O: rheumatic fever   . Chronic anticoagulation     Assessment: 61 yof to ED with CC of SOB. Pharmacy consulted to dose pta warfarin for afib. No bleed documented. CBC wnl. INR from 6/13 2.3 therapeutic. Last warfarin dose taken on 6/13.  PTA warfarin dose: 2mg  daily, except  1mg  on Wed  Goal of Therapy:  INR 2-3 Monitor platelets by anticoagulation protocol: Yes   Plan:  Warfarin 2mg  x 1 dose tonight  Daily INR  Mon s/sx bleed   Elicia Lamp, PharmD Clinical Pharmacist - Resident Pager [phone] 03/11/2015 3:54 PM

## 2015-03-11 NOTE — ED Notes (Signed)
Assisted to BR, became minimally short of breath.

## 2015-03-12 ENCOUNTER — Inpatient Hospital Stay (HOSPITAL_COMMUNITY): Payer: Medicare Other

## 2015-03-12 ENCOUNTER — Other Ambulatory Visit (HOSPITAL_COMMUNITY): Payer: Self-pay

## 2015-03-12 DIAGNOSIS — R0600 Dyspnea, unspecified: Secondary | ICD-10-CM

## 2015-03-12 DIAGNOSIS — J441 Chronic obstructive pulmonary disease with (acute) exacerbation: Secondary | ICD-10-CM

## 2015-03-12 DIAGNOSIS — J9601 Acute respiratory failure with hypoxia: Principal | ICD-10-CM

## 2015-03-12 LAB — BASIC METABOLIC PANEL
ANION GAP: 9 (ref 5–15)
BUN: 12 mg/dL (ref 6–20)
CALCIUM: 9.3 mg/dL (ref 8.9–10.3)
CHLORIDE: 105 mmol/L (ref 101–111)
CO2: 25 mmol/L (ref 22–32)
Creatinine, Ser: 0.69 mg/dL (ref 0.44–1.00)
GFR calc Af Amer: >60 mL/min (ref >60)
GFR calc non Af Amer: >60 mL/min (ref >60)
Glucose, Bld: 144 mg/dL — ABNORMAL HIGH (ref 65–99)
Potassium: 4.6 mmol/L (ref 3.5–5.1)
Sodium: 139 mmol/L (ref 135–145)

## 2015-03-12 LAB — CBC
HCT: 40 % (ref 36.0–46.0)
HEMOGLOBIN: 13.4 g/dL (ref 12.0–15.0)
MCH: 30.4 pg (ref 26.0–34.0)
MCHC: 33.5 g/dL (ref 30.0–36.0)
MCV: 90.7 fL (ref 78.0–100.0)
PLATELETS: 162 10*3/uL (ref 150–400)
RBC: 4.41 MIL/uL (ref 3.87–5.11)
RDW: 13.9 % (ref 11.5–15.5)
WBC: 11.2 10*3/uL — ABNORMAL HIGH (ref 4.0–10.5)

## 2015-03-12 LAB — PROTIME-INR
INR: 2.42 — ABNORMAL HIGH (ref 0.00–1.49)
Prothrombin Time: 26.1 seconds — ABNORMAL HIGH (ref 11.6–15.2)

## 2015-03-12 MED ORDER — IPRATROPIUM BROMIDE 0.02 % IN SOLN
0.5000 mg | RESPIRATORY_TRACT | Status: DC
Start: 2015-03-12 — End: 2015-03-13
  Administered 2015-03-12 – 2015-03-13 (×7): 0.5 mg via RESPIRATORY_TRACT
  Filled 2015-03-12 (×7): qty 2.5

## 2015-03-12 MED ORDER — BUDESONIDE 0.25 MG/2ML IN SUSP
0.2500 mg | Freq: Two times a day (BID) | RESPIRATORY_TRACT | Status: DC
  Administered 2015-03-12 – 2015-03-14 (×4): 0.25 mg via RESPIRATORY_TRACT
  Filled 2015-03-12 (×8): qty 2

## 2015-03-12 MED ORDER — LEVALBUTEROL HCL 0.63 MG/3ML IN NEBU
0.6300 mg | INHALATION_SOLUTION | RESPIRATORY_TRACT | Status: DC
  Administered 2015-03-12 – 2015-03-13 (×7): 0.63 mg via RESPIRATORY_TRACT
  Filled 2015-03-12 (×13): qty 3

## 2015-03-12 MED ORDER — WARFARIN SODIUM 1 MG PO TABS
1.0000 mg | ORAL_TABLET | Freq: Once | ORAL | Status: AC
  Administered 2015-03-12: 1 mg via ORAL
  Filled 2015-03-12: qty 1

## 2015-03-12 MED ORDER — FUROSEMIDE 10 MG/ML IJ SOLN
40.0000 mg | Freq: Once | INTRAMUSCULAR | Status: AC
  Administered 2015-03-12: 40 mg via INTRAVENOUS
  Filled 2015-03-12: qty 4

## 2015-03-12 MED ORDER — FUROSEMIDE 40 MG PO TABS
40.0000 mg | ORAL_TABLET | Freq: Two times a day (BID) | ORAL | Status: DC
  Administered 2015-03-12 – 2015-03-14 (×4): 40 mg via ORAL
  Filled 2015-03-12 (×7): qty 1

## 2015-03-12 NOTE — Progress Notes (Signed)
[facility]                                                                              Patient Demographics  Catherine [name], is a 80 y.o. female, DOB - 1935-07-31, KXF:[id number]  Admit date - 03/11/2015   Admitting Physician Robbie Lis, MD  Outpatient Primary MD for the patient is [name], MD  LOS - 1   Chief Complaint  Patient presents with  . Shortness of Breath       Brief HPI   80 year old female with past medical history of atrial fibrillation on full dose anticoagulation with coumadin, last 2 D ECHO in 02/2014 with EF of 60%, rheumatic heart fever and rheumatic heart disease, hypertension who presented with main concern of progressive shortness of breath over last 2-3 days prior to this admission. Shortness of breath was present at rest and with exertion. She reported using mucinex and albuterol with no significant symptomatic relief. She odes reports subjective fevers at home.No chest pain and no palpitation. In ED, BP was 109/56, T max 100.6 F, HR 139, RR 14-22, oxygen saturation was 92% on [place] oxygen support. Blood work showed hypokalemia of 3.0, normal troponin level. She was given solumedrol and nebulizer treatment in ED and while she felt better she still had shortness of breath for which reason she was admitted for further evaluation and management of shortness of breath thought to be due to COPD and bronchitis.    Assessment & Plan    Principal Problem:   Acute respiratory failure with hypoxia, acute COPD exacerbation with acute bronchitis and SIRS - Still very tight, chest x-ray showed bronchitic changes - Continue the Zithromax, IV Solu-Medrol, based on scheduled Xopenex and Atrovent, Pulmicort, flutter valve - Home O2 evaluation prior to discharge.  Active Problems:   Moderate mitral regurgitation - Per prior echo in 2015, EF 60-65% with moderate to severe mitral regurgitation    Atrial fibrillation - CHADS vasc score 4 - On  anticoagulation with coumadin - Rate controlled with digoxin and atenolol   Acute on chronic Chronic CHF with moderate to severe mitral regurgitation, pulmonary hypertension - Repeat 2-D echocardiogram, assess mitral regurgitation - Continue Lasix, strict I's and O's and daily weights    Hypokalemia - Currently stable, will continue potassium replacement for diuresis    Essential hypertension - Currently stable, continue atenolol   Code Status: Full code  Family Communication: Discussed in detail with the patient, all imaging results, lab results explained to the patient   Disposition Plan: Hopefully next 24-48 hours  Time Spent in minutes   25 minutes  Procedures  None  Consults   None  DVT Prophylaxis  Coumadin   Medications  Scheduled Meds: . atenolol  12.5 mg Oral Daily  . azithromycin  500 mg Intravenous Q24H  . benzonatate  200 mg Oral TID  . budesonide  0.25 mg Nebulization BID  . digoxin  125 mcg Oral Daily  . docusate sodium  100 mg Oral BID  . furosemide  40 mg Oral BID  . ipratropium  0.5 mg Nebulization Q4H  . levalbuterol  0.63 mg  Nebulization Q4H  . methylPREDNISolone (SOLU-MEDROL) injection  60 mg Intravenous Q6H  . multivitamin  1 tablet Oral Daily  . sodium chloride  3 mL Intravenous Q12H  . warfarin  1 mg Oral ONCE-1800  . Warfarin - Pharmacist Dosing Inpatient   Does not apply q1800   Continuous Infusions: . sodium chloride 10 mL/hr at 03/11/15 1858   PRN Meds:.acetaminophen **OR** acetaminophen, alum & mag hydroxide-simeth, diazepam, guaifenesin, ipratropium, levalbuterol, ondansetron **OR** ondansetron (ZOFRAN) IV   Antibiotics   Anti-infectives    Start     Dose/Rate Route Frequency Ordered Stop   03/11/15 1700  azithromycin (ZITHROMAX) 500 mg in dextrose 5 % 250 mL IVPB     500 mg 250 mL/hr over 60 Minutes Intravenous Every 24 hours 03/11/15 1615     03/11/15 1700  fluconazole (DIFLUCAN) tablet 200 mg     200 mg Oral  Once 03/11/15  1645 03/11/15 1825        Subjective:   [name] was seen and examined today. Still very tight and wheezy, and no chest pain. Feeling short of breath, wheezing audibly during conversation. Patient denies dizziness, chest pain,, abdominal pain, N/V/D/C, new weakness, numbess, tingling. No acute events overnight.    Objective:   Blood pressure 129/50, pulse 73, temperature 98.2 F (36.8 C), temperature source Oral, resp. rate 18, height 5\' 4"  (1.626 m), weight 66.044 kg (145 lb 9.6 oz), SpO2 96 %.  Wt Readings from Last 3 Encounters:  03/12/15 66.044 kg (145 lb 9.6 oz)  03/03/15 65.681 kg (144 lb 12.8 oz)  01/27/15 64.864 kg (143 lb)     Intake/Output Summary (Last 24 hours) at 03/12/15 1011 Last data filed at 03/12/15 0854  Gross per 24 hour  Intake    850 ml  Output      0 ml  Net    850 ml    Exam  General: Alert and oriented x 3, NAD  HEENT:  PERRLA, EOMI, Anicteric Sclera, mucous membranes moist.   Neck: Supple, no JVD, no masses  CVS: S1 S2 auscultated, 2/6 holosystolic murmur at the left sternal edge   Respiratory: Diffuse expiratory wheezing bilaterally  Abdomen: Soft, nontender, nondistended, + bowel sounds  Ext: no cyanosis clubbing or edema  Neuro: AAOx3, Cr N's II- XII. Strength 5/5 upper and lower extremities bilaterally  Skin: No rashes  Psych: Normal affect and demeanor, alert and oriented x3    Data Review   Micro Results No results found for this or any previous visit (from the past 240 hour(s)).  Radiology Reports Dg Chest Port 1 View  03/11/2015   CLINICAL DATA:  Shortness of breath.  Wheezing.  Chronic bronchitis.  EXAM: PORTABLE CHEST - 1 VIEW  COMPARISON:  Radiographs dated 12/08/2013 and 11/17/2012 and chest CT dated 07/30/2007  FINDINGS: Heart size and pulmonary vascularity are normal. There is peribronchial thickening. Chronic elevation of the lateral aspect of the left hemidiaphragm.  No infiltrates or effusions.  IMPRESSION:  Bronchitic changes.   Electronically Signed   By: Lorriane Shire M.D.   On: 03/11/2015 09:03   Mm Digital Screening Bilateral  03/10/2015   CLINICAL DATA:  Screening.  EXAM: DIGITAL SCREENING BILATERAL MAMMOGRAM WITH CAD  COMPARISON:  Previous exam(s).  ACR Breast Density Category b: There are scattered areas of fibroglandular density.  FINDINGS: There are no findings suspicious for malignancy. Images were processed with CAD.  IMPRESSION: No mammographic evidence of malignancy. A result letter of this screening mammogram will be mailed  directly to the patient.  RECOMMENDATION: Screening mammogram in one year. (Code:SM-B-01Y)  BI-RADS CATEGORY  1: Negative.   Electronically Signed   By: Pamelia Hoit M.D.   On: 03/10/2015 14:55    CBC  Recent Labs Lab 03/11/15 0853 03/12/15 0251  WBC 7.6 11.2*  HGB 13.2 13.4  HCT 38.9 40.0  PLT 156 162  MCV 89.8 90.7  MCH 30.5 30.4  MCHC 33.9 33.5  RDW 13.5 13.9  LYMPHSABS 1.1  --   MONOABS 0.7  --   EOSABS 0.1  --   BASOSABS 0.0  --     Chemistries   Recent Labs Lab 03/11/15 0853 03/11/15 1715 03/12/15 0251  NA 141  --  139  K 3.0*  --  4.6  CL 100*  --  105  CO2 30  --  25  GLUCOSE 136*  --  144*  BUN 11  --  12  CREATININE 0.82  --  0.69  CALCIUM 8.8*  --  9.3  MG  --  2.0  --    ------------------------------------------------------------------------------------------------------------------ estimated creatinine clearance is 55 mL/min (by C-G formula based on Cr of 0.69). ------------------------------------------------------------------------------------------------------------------ No results for input(s): HGBA1C in the last 72 hours. ------------------------------------------------------------------------------------------------------------------ No results for input(s): CHOL, HDL, LDLCALC, TRIG, CHOLHDL, LDLDIRECT in the last 72  hours. ------------------------------------------------------------------------------------------------------------------ No results for input(s): TSH, T4TOTAL, T3FREE, THYROIDAB in the last 72 hours.  Invalid input(s): FREET3 ------------------------------------------------------------------------------------------------------------------ No results for input(s): VITAMINB12, FOLATE, FERRITIN, TIBC, IRON, RETICCTPCT in the last 72 hours.  Coagulation profile  Recent Labs Lab 03/10/15 03/12/15 0251  INR 2.3 2.42*    No results for input(s): DDIMER in the last 72 hours.  Cardiac Enzymes  Recent Labs Lab 03/11/15 0853 03/11/15 1345  TROPONINI <0.03 <0.03   ------------------------------------------------------------------------------------------------------------------ Invalid input(s): POCBNP  No results for input(s): GLUCAP in the last 72 hours.   Jaxin Fulfer M.D. [facility] 03/12/2015, 10:11 AM  Pager: [phone]   Between 7am to 7pm - call Pager - [phone]  After 7pm go to [email or web address] - password [email or web address]  Call night coverage person covering after 7pm

## 2015-03-12 NOTE — Progress Notes (Signed)
Echocardiogram 2D Echocardiogram has been performed.  Catherine [name] 03/12/2015, 2:26 PM

## 2015-03-12 NOTE — Progress Notes (Signed)
UR COMPLETED  

## 2015-03-12 NOTE — Progress Notes (Signed)
[place] for Warfarin Indication: atrial fibrillation  Allergies  Allergen Reactions  . Ace Inhibitors Other (See Comments)    unknown  . Aldactone [Spironolactone]     dyspnea  . Benazepril Hcl     cough  . Ciprocin-Fluocin-Procin [Fluocinolone Acetonide] Other (See Comments)    unknown  . Ciprofloxacin Diarrhea  . Codeine Nausea Only  . Diltiazem     ha  . Flagyl [Metronidazole Hcl] Other (See Comments)    headache  . Flovent [Fluticasone Propionate]     Leg cramps  . Gabapentin Swelling  . Lyrica [Pregabalin] Swelling  . Nitrofurantoin Monohyd Macro     nausea  . Quinidine     Fever diarrhea  . Simvastatin     Leg pain   . Tramadol Nausea Only  . Verapamil     myalgias  . Amoxicillin Palpitations    tachy    Patient Measurements: Height: 5\' 4"  (162.6 cm) Weight: 145 lb 9.6 oz (66.044 kg) IBW/kg (Calculated) : 54.7  Vital Signs: Temp: 98.2 F (36.8 C) (06/15 0558) Temp Source: Oral (06/15 0558) BP: 129/50 mmHg (06/15 0558) Pulse Rate: 73 (06/15 0558)  Labs:  Recent Labs  03/10/15 03/11/15 0853 03/11/15 1345 03/12/15 0251  HGB  --  13.2  --  13.4  HCT  --  38.9  --  40.0  PLT  --  156  --  162  LABPROT  --   --   --  26.1*  INR 2.3  --   --  2.42*  CREATININE  --  0.82  --  0.69  TROPONINI  --  <0.03 <0.03  --     Estimated Creatinine Clearance: 55 mL/min (by C-G formula based on Cr of 0.69).  Assessment: 20 yof to ED with CC of SOB. Pharmacy consulted to dose pta warfarin for afib. No bleed documented. CBC wnl. INR on admission 2.3 therapeutic. Last warfarin dose taken on 6/13.  PTA warfarin dose: 2mg  daily, except 1mg  on Wed INR therapeutic   Goal of Therapy:  INR 2-3 Monitor platelets by anticoagulation protocol: Yes   Plan:  Warfarin 1mg  x 1 dose tonight  Daily INR  Mon s/sx bleed   Thank you. Anette Guarneri, PharmD [phone]  03/12/2015 9:54 AM

## 2015-03-13 LAB — BASIC METABOLIC PANEL
Anion gap: 12 (ref 5–15)
BUN: 19 mg/dL (ref 6–20)
CALCIUM: 8.9 mg/dL (ref 8.9–10.3)
CO2: 27 mmol/L (ref 22–32)
Chloride: 101 mmol/L (ref 101–111)
Creatinine, Ser: 0.76 mg/dL (ref 0.44–1.00)
GFR calc Af Amer: >60 mL/min (ref >60)
GFR calc non Af Amer: >60 mL/min (ref >60)
GLUCOSE: 136 mg/dL — AB (ref 65–99)
POTASSIUM: 4.4 mmol/L (ref 3.5–5.1)
Sodium: 140 mmol/L (ref 135–145)

## 2015-03-13 LAB — DIGOXIN LEVEL: Digoxin Level: 0.7 ng/mL — ABNORMAL LOW (ref 0.8–2.0)

## 2015-03-13 LAB — PROTIME-INR
INR: 3.57 — ABNORMAL HIGH (ref 0.00–1.49)
Prothrombin Time: 34.9 seconds — ABNORMAL HIGH (ref 11.6–15.2)

## 2015-03-13 MED ORDER — METHYLPREDNISOLONE SODIUM SUCC 125 MG IJ SOLR
60.0000 mg | Freq: Two times a day (BID) | INTRAMUSCULAR | Status: DC
  Administered 2015-03-14: 60 mg via INTRAVENOUS
  Filled 2015-03-13 (×2): qty 0.96

## 2015-03-13 MED ORDER — AZITHROMYCIN 500 MG PO TABS
500.0000 mg | ORAL_TABLET | Freq: Every day | ORAL | Status: DC
  Administered 2015-03-13: 500 mg via ORAL
  Filled 2015-03-13 (×2): qty 1

## 2015-03-13 NOTE — Progress Notes (Signed)
[facility]                                                                              Patient Demographics  Catherine [name], is a 80 y.o. female, DOB - [birth year], LTJ:[id number]  Admit date - 03/11/2015   Admitting Physician Robbie Lis, MD  Outpatient Primary MD for the patient is [name], MD  LOS - 2   Chief Complaint  Patient presents with  . Shortness of Breath       Brief HPI   80 year old female with past medical history of atrial fibrillation on full dose anticoagulation with coumadin, last 2 D ECHO in 02/2014 with EF of 60%, rheumatic heart fever and rheumatic heart disease, hypertension who presented with main concern of progressive shortness of breath over last 2-3 days prior to this admission. Shortness of breath was present at rest and with exertion. She reported using mucinex and albuterol with no significant symptomatic relief. She odes reports subjective fevers at home.No chest pain and no palpitation. In ED, BP was 109/56, T max 100.6 F, HR 139, RR 14-22, oxygen saturation was 92% on [place] oxygen support. Blood work showed hypokalemia of 3.0, normal troponin level. She was given solumedrol and nebulizer treatment in ED and while she felt better she still had shortness of breath for which reason she was admitted for further evaluation and management of shortness of breath thought to be due to COPD and bronchitis.    Assessment & Plan    Principal Problem:   Acute respiratory failure with hypoxia, acute COPD exacerbation with acute bronchitis and SIRS - Significant improving today, decrease IV Solu-Medrol - Continue  Xopenex and Atrovent, Pulmicort, flutter valve - Home O2 evaluation prior to discharge.  Active Problems:   Moderate mitral regurgitation - Per prior echo in 2015, EF 60-65% with moderate to severe mitral regurgitation    Atrial fibrillation - CHADS vasc score 4 - On anticoagulation with coumadin - Rate controlled with  digoxin and atenolol   Acute on chronic Chronic CHF with moderate to severe mitral regurgitation, pulmonary hypertension - Repeat 2-D echocardiogram, assess mitral regurgitation - Continue Lasix, strict I's and O's and daily weights, still positive balance of 298 mL.    Hypokalemia - Currently stable, will continue potassium replacement for diuresis    Essential hypertension - Currently stable, continue atenolol   Code Status: Full code  Family Communication: Discussed in detail with the patient, all imaging results, lab results explained to the patient   Disposition Plan: hopefully tomorrow  Time Spent in minutes   25 minutes  Procedures  None  Consults   None  DVT Prophylaxis  Coumadin   Medications  Scheduled Meds: . atenolol  12.5 mg Oral Daily  . azithromycin  500 mg Oral Q supper  . benzonatate  200 mg Oral TID  . budesonide  0.25 mg Nebulization BID  . digoxin  125 mcg Oral Daily  . docusate sodium  100 mg Oral BID  . furosemide  40 mg Oral BID  . ipratropium  0.5 mg Nebulization Q4H  . levalbuterol  0.63 mg Nebulization Q4H  .  methylPREDNISolone (SOLU-MEDROL) injection  60 mg Intravenous Q6H  . multivitamin  1 tablet Oral Daily  . sodium chloride  3 mL Intravenous Q12H  . Warfarin - Pharmacist Dosing Inpatient   Does not apply q1800   Continuous Infusions: . sodium chloride 10 mL/hr at 03/11/15 1858   PRN Meds:.acetaminophen **OR** acetaminophen, alum & mag hydroxide-simeth, diazepam, guaifenesin, ipratropium, levalbuterol, ondansetron **OR** ondansetron (ZOFRAN) IV   Antibiotics   Anti-infectives    Start     Dose/Rate Route Frequency Ordered Stop   03/13/15 1700  azithromycin (ZITHROMAX) tablet 500 mg     500 mg Oral Daily with supper 03/13/15 0826     03/11/15 1700  azithromycin (ZITHROMAX) 500 mg in dextrose 5 % 250 mL IVPB  Status:  Discontinued     500 mg 250 mL/hr over 60 Minutes Intravenous Every 24 hours 03/11/15 1615 03/13/15 0825    03/11/15 1700  fluconazole (DIFLUCAN) tablet 200 mg     200 mg Oral  Once 03/11/15 1645 03/11/15 1825        Subjective:   Catherine [name] was seen and examined today.  improving today  Patient denies dizziness, chest pain,, abdominal pain, N/V/D/C, new weakness, numbess, tingling. No acute events overnight.    Objective:   Blood pressure 127/82, pulse 78, temperature 98 F (36.7 C), temperature source Oral, resp. rate 24, height 5\' 4"  (1.626 m), weight 66.089 kg (145 lb 11.2 oz), SpO2 95 %.  Wt Readings from Last 3 Encounters:  03/13/15 66.089 kg (145 lb 11.2 oz)  03/03/15 65.681 kg (144 lb 12.8 oz)  01/27/15 64.864 kg (143 lb)     Intake/Output Summary (Last 24 hours) at 03/13/15 1131 Last data filed at 03/13/15 0916  Gross per 24 hour  Intake   1698 ml  Output   2250 ml  Net   -552 ml    Exam  General: Alert and oriented x 3, NAD  HEENT:  PERRLA, EOMI, Anicteric Sclera, mucous membranes moist.   Neck: Supple, no JVD, no masses  CVS: S1 S2 auscultated, 2/6 holosystolic murmur at the left sternal edge   Respiratory:wheezing significantly improved today  abdomen: Soft, nontender, nondistended, + bowel sounds  Ext: no cyanosis clubbing or edema  Neuro: AAOx3, Cr N's II- XII. Strength 5/5 upper and lower extremities bilaterally  Skin: No rashes  Psych: Normal affect and demeanor, alert and oriented x3    Data Review   Micro Results No results found for this or any previous visit (from the past 240 hour(s)).  Radiology Reports Dg Chest Port 1 View  03/11/2015   CLINICAL DATA:  Shortness of breath.  Wheezing.  Chronic bronchitis.  EXAM: PORTABLE CHEST - 1 VIEW  COMPARISON:  Radiographs dated 12/08/2013 and 11/17/2012 and chest CT dated 07/30/2007  FINDINGS: Heart size and pulmonary vascularity are normal. There is peribronchial thickening. Chronic elevation of the lateral aspect of the left hemidiaphragm.  No infiltrates or effusions.  IMPRESSION: Bronchitic  changes.   Electronically Signed   By: Lorriane Shire M.D.   On: 03/11/2015 09:03   Mm Digital Screening Bilateral  03/10/2015   CLINICAL DATA:  Screening.  EXAM: DIGITAL SCREENING BILATERAL MAMMOGRAM WITH CAD  COMPARISON:  Previous exam(s).  ACR Breast Density Category b: There are scattered areas of fibroglandular density.  FINDINGS: There are no findings suspicious for malignancy. Images were processed with CAD.  IMPRESSION: No mammographic evidence of malignancy. A result letter of this screening mammogram will be mailed directly to the  patient.  RECOMMENDATION: Screening mammogram in one year. (Code:SM-B-01Y)  BI-RADS CATEGORY  1: Negative.   Electronically Signed   By: Pamelia Hoit M.D.   On: 03/10/2015 14:55    CBC  Recent Labs Lab 03/11/15 0853 03/12/15 0251  WBC 7.6 11.2*  HGB 13.2 13.4  HCT 38.9 40.0  PLT 156 162  MCV 89.8 90.7  MCH 30.5 30.4  MCHC 33.9 33.5  RDW 13.5 13.9  LYMPHSABS 1.1  --   MONOABS 0.7  --   EOSABS 0.1  --   BASOSABS 0.0  --     Chemistries   Recent Labs Lab 03/11/15 0853 03/11/15 1715 03/12/15 0251 03/13/15 0555  NA 141  --  139 140  K 3.0*  --  4.6 4.4  CL 100*  --  105 101  CO2 30  --  25 27  GLUCOSE 136*  --  144* 136*  BUN 11  --  12 19  CREATININE 0.82  --  0.69 0.76  CALCIUM 8.8*  --  9.3 8.9  MG  --  2.0  --   --    ------------------------------------------------------------------------------------------------------------------ estimated creatinine clearance is 55.1 mL/min (by C-G formula based on Cr of 0.76). ------------------------------------------------------------------------------------------------------------------ No results for input(s): HGBA1C in the last 72 hours. ------------------------------------------------------------------------------------------------------------------ No results for input(s): CHOL, HDL, LDLCALC, TRIG, CHOLHDL, LDLDIRECT in the last 72  hours. ------------------------------------------------------------------------------------------------------------------ No results for input(s): TSH, T4TOTAL, T3FREE, THYROIDAB in the last 72 hours.  Invalid input(s): FREET3 ------------------------------------------------------------------------------------------------------------------ No results for input(s): VITAMINB12, FOLATE, FERRITIN, TIBC, IRON, RETICCTPCT in the last 72 hours.  Coagulation profile  Recent Labs Lab 03/10/15 03/12/15 0251 03/13/15 0555  INR 2.3 2.42* 3.57*    No results for input(s): DDIMER in the last 72 hours.  Cardiac Enzymes  Recent Labs Lab 03/11/15 0853 03/11/15 1345  TROPONINI <0.03 <0.03   ------------------------------------------------------------------------------------------------------------------ Invalid input(s): POCBNP  No results for input(s): GLUCAP in the last 72 hours.   Mehak Roskelley M.D. [facility] 03/13/2015, 11:31 AM  Pager: [phone]   Between 7am to 7pm - call Pager - [phone]  After 7pm go to [email or web address] - password [email or web address]  Call night coverage person covering after 7pm

## 2015-03-13 NOTE — Clinical Documentation Improvement (Signed)
ED note, H&P, and progress note indicate hx of chronic kidney disease.  03/11/15, 03/12/15, 03/13/15:  GFR= > 60  Please specify the stage of CKD if possible.  CKD Stage I - GFR > or = 90 CKD Stage II - GFR 60-80 CKD Stage III - GFR 30-59 CKD Stage IV - GFR 15-29 CKD Stage V - GFR < 15 ESRD (End Stage Renal Disease) Other condition_____________ Cannot clinically suspect or determine  Pryor Montes, BSN, RN [place] HIM/Clinical Documentation Specialist [email or web address] [phone]

## 2015-03-13 NOTE — Progress Notes (Signed)
[place] for Warfarin Indication: atrial fibrillation  Allergies  Allergen Reactions  . Ace Inhibitors Other (See Comments)    unknown  . Aldactone [Spironolactone]     dyspnea  . Benazepril Hcl     cough  . Ciprocin-Fluocin-Procin [Fluocinolone Acetonide] Other (See Comments)    unknown  . Ciprofloxacin Diarrhea  . Codeine Nausea Only  . Diltiazem     ha  . Flagyl [Metronidazole Hcl] Other (See Comments)    headache  . Flovent [Fluticasone Propionate]     Leg cramps  . Gabapentin Swelling  . Lyrica [Pregabalin] Swelling  . Nitrofurantoin Monohyd Macro     nausea  . Quinidine     Fever diarrhea  . Simvastatin     Leg pain   . Tramadol Nausea Only  . Verapamil     myalgias  . Amoxicillin Palpitations    tachy    Patient Measurements: Height: 5\' 4"  (162.6 cm) Weight: 145 lb 11.2 oz (66.089 kg) (scale A) IBW/kg (Calculated) : 54.7  Vital Signs: Temp: 98 F (36.7 C) (06/16 0541) Temp Source: Oral (06/16 0541) BP: 136/76 mmHg (06/16 0541) Pulse Rate: 66 (06/16 0541)  Labs:  Recent Labs  03/11/15 0853 03/11/15 1345 03/12/15 0251 03/13/15 0555  HGB 13.2  --  13.4  --   HCT 38.9  --  40.0  --   PLT 156  --  162  --   LABPROT  --   --  26.1* 34.9*  INR  --   --  2.42* 3.57*  CREATININE 0.82  --  0.69  --   TROPONINI <0.03 <0.03  --   --     Estimated Creatinine Clearance: 55.1 mL/min (by C-G formula based on Cr of 0.69).  Assessment: 38 yof to ED with CC of SOB. Pharmacy consulted to dose pta warfarin for afib. No bleed documented. CBC wnl. INR on admission 2.3.  INR today has increased to 3.57 potentially due to addition of antibiotics.  PTA warfarin dose: 2mg  daily, except 1mg  on Wed   Goal of Therapy:  INR 2-3 Monitor platelets by anticoagulation protocol: Yes   Plan:  No coumadin today Daily INR  Mon s/sx bleed   Thanks for allowing pharmacy to be a part of this patient's care.  Excell Seltzer,  PharmD Clinical Pharmacist, [phone] 03/13/2015 8:21 AM

## 2015-03-14 DIAGNOSIS — J209 Acute bronchitis, unspecified: Secondary | ICD-10-CM

## 2015-03-14 LAB — BASIC METABOLIC PANEL
ANION GAP: 9 (ref 5–15)
BUN: 23 mg/dL — ABNORMAL HIGH (ref 6–20)
CALCIUM: 8.9 mg/dL (ref 8.9–10.3)
CHLORIDE: 104 mmol/L (ref 101–111)
CO2: 29 mmol/L (ref 22–32)
Creatinine, Ser: 0.84 mg/dL (ref 0.44–1.00)
GFR calc Af Amer: >60 mL/min (ref >60)
GFR calc non Af Amer: >60 mL/min (ref >60)
Glucose, Bld: 128 mg/dL — ABNORMAL HIGH (ref 65–99)
Potassium: 3.7 mmol/L (ref 3.5–5.1)
SODIUM: 142 mmol/L (ref 135–145)

## 2015-03-14 LAB — PROTIME-INR
INR: 3.5 — AB (ref 0.00–1.49)
PROTHROMBIN TIME: 34.3 s — AB (ref 11.6–15.2)

## 2015-03-14 MED ORDER — PREDNISONE 10 MG PO TABS: ORAL_TABLET | ORAL | Status: DC

## 2015-03-14 MED ORDER — DOCUSATE SODIUM 100 MG PO CAPS: 100.0000 mg | ORAL_CAPSULE | Freq: Two times a day (BID) | ORAL | Status: DC

## 2015-03-14 MED ORDER — GUAIFENESIN 100 MG/5ML PO SYRP: 200.0000 mg | ORAL_SOLUTION | ORAL | Status: DC | PRN

## 2015-03-14 MED ORDER — BUDESONIDE-FORMOTEROL FUMARATE 80-4.5 MCG/ACT IN AERO: 2.0000 | INHALATION_SPRAY | Freq: Two times a day (BID) | RESPIRATORY_TRACT | Status: DC

## 2015-03-14 MED ORDER — AZITHROMYCIN 500 MG PO TABS: 500.0000 mg | ORAL_TABLET | Freq: Every day | ORAL | Status: DC

## 2015-03-14 MED ORDER — BENZONATATE 100 MG PO CAPS: 100.0000 mg | ORAL_CAPSULE | Freq: Three times a day (TID) | ORAL | Status: DC | PRN

## 2015-03-14 MED ORDER — PREDNISONE 20 MG PO TABS
40.0000 mg | ORAL_TABLET | Freq: Once | ORAL | Status: AC
  Administered 2015-03-14: 40 mg via ORAL
  Filled 2015-03-14: qty 2

## 2015-03-14 MED ORDER — WARFARIN SODIUM 2 MG PO TABS: ORAL_TABLET | ORAL | Status: DC

## 2015-03-14 NOTE — Progress Notes (Signed)
Notified by CCMD that pt is bradycardic, with HR 35-40.  Although pt has discharge orders, notified Dr. Tana Coast of these bradycardic events.  I held her digoxin 125 mcg this am due to her HR of 57.  Dr. Tana Coast states that she will discontinue the digoxin, and to watch the pt's HR until after lunch.

## 2015-03-14 NOTE — Progress Notes (Signed)
Orders received for pt discharge.  Discharge summary printed and reviewed with pt.  Explained medication regimen, and pt had no further questions at this time.  IV removed and site remains clean, dry, intact.  Telemetry removed.  Pt in stable condition and awaiting transport. 

## 2015-03-14 NOTE — Discharge Summary (Signed)
Physician Discharge Summary   Patient ID: Catherine [name] MRN: [id number] DOB/AGE: [birth year] 78 y.o.  Admit date: 03/11/2015 Discharge date: 03/14/2015  Primary Care Physician:  [name], MD  Discharge Diagnoses:   . Acute respiratory failure with hypoxia . acute bronchitis with COPD C . Atrial fibrillation with bradycardia  . hyperlipidemia  . Moderate mitral regurgitation . Bronchitis, acute . Hypokalemia . Essential hypertension   Consults: None   Recommendations for Outpatient Follow-up:  Please note patient had atrial fibrillation with bradycardia, I have discontinued digoxin for now, please follow closely and readjust medication   TESTS THAT NEED FOLLOW-UP CBC, BMET   DIET: Heart healthy diet    Allergies:   Allergies  Allergen Reactions  . Ace Inhibitors Other (See Comments)    unknown  . Aldactone [Spironolactone]     dyspnea  . Benazepril Hcl     cough  . Ciprocin-Fluocin-Procin [Fluocinolone Acetonide] Other (See Comments)    unknown  . Ciprofloxacin Diarrhea  . Codeine Nausea Only  . Diltiazem     ha  . Flagyl [Metronidazole Hcl] Other (See Comments)    headache  . Flovent [Fluticasone Propionate]     Leg cramps  . Gabapentin Swelling  . Lyrica [Pregabalin] Swelling  . Nitrofurantoin Monohyd Macro     nausea  . Quinidine     Fever diarrhea  . Simvastatin     Leg pain   . Tramadol Nausea Only  . Verapamil     myalgias  . Amoxicillin Palpitations    tachy     Discharge Medications:   Medication List    STOP taking these medications        digoxin 0.125 MG tablet  Commonly known as:  LANOXIN      TAKE these medications        acetaminophen 500 MG tablet  Commonly known as:  TYLENOL  Take 1,000 mg by mouth every 8 (eight) hours as needed for mild pain or headache.     ACETYLCARNITINE HCL PO  Take 1 capsule by mouth 3 (three) times daily.     albuterol 108 (90 BASE) MCG/ACT inhaler  Commonly known as:  PROVENTIL  HFA;VENTOLIN HFA  Inhale 2 puffs into the lungs every 6 (six) hours as needed for wheezing or shortness of breath.     atenolol 25 MG tablet  Commonly known as:  TENORMIN  Take 12.5 mg by mouth daily.     azithromycin 500 MG tablet  Commonly known as:  ZITHROMAX  Take 1 tablet (500 mg total) by mouth daily. X5 DAYS     B-complex with vitamin C tablet  Take 1 tablet by mouth daily.     benzonatate 100 MG capsule  Commonly known as:  TESSALON  Take 1 capsule (100 mg total) by mouth 3 (three) times daily as needed for cough.     budesonide-formoterol 80-4.5 MCG/ACT inhaler  Commonly known as:  SYMBICORT  Inhale 2 puffs into the lungs 2 (two) times daily.     calcium-vitamin D 250-125 MG-UNIT per tablet  Commonly known as:  OSCAL WITH D  Take 1 tablet by mouth 2 (two) times daily.     diazepam 5 MG tablet  Commonly known as:  VALIUM  Take 2.5 mg by mouth daily as needed for anxiety (for neuropathy).     docusate sodium 100 MG capsule  Commonly known as:  COLACE  Take 1 capsule (100 mg total) by mouth 2 (two) times daily.  furosemide 80 MG tablet  Commonly known as:  LASIX  Take 40 mg by mouth 2 (two) times daily.     guaifenesin 100 MG/5ML syrup  Commonly known as:  ROBITUSSIN  Take 10 mLs (200 mg total) by mouth every 4 (four) hours as needed for congestion.     multivitamin tablet  Take 1 tablet by mouth daily.     potassium chloride 10 MEQ tablet  Commonly known as:  K-DUR,KLOR-CON  Take 20 mEq by mouth 5 (five) times daily.     predniSONE 10 MG tablet  Commonly known as:  DELTASONE  Prednisone dosing: Take  Prednisone 40mg  (4 tabs) x 3 days, then taper to 30mg  (3 tabs) x 3 days, then 20mg  (2 tabs) x 3days, then 10mg  (1 tab) x 3days, then OFF.  Dispense:  30 tabs, refills: None     triamterene-hydrochlorothiazide 37.5-25 MG per tablet  Commonly known as:  MAXZIDE-25  Take 1 tablet by mouth daily.     warfarin 2 MG tablet  Commonly known as:  COUMADIN  Take  as directed by anticoagulation clinic         Brief H and P: For complete details please refer to admission H and P, but in brief 80 year old female with past medical history of atrial fibrillation on full dose anticoagulation with coumadin, last 2 D ECHO in 02/2014 with EF of 60%, rheumatic heart fever and rheumatic heart disease, hypertension who presented with main concern of progressive shortness of breath over last 2-3 days prior to this admission. Shortness of breath was present at rest and with exertion. She reported using mucinex and albuterol with no significant symptomatic relief. She odes reports subjective fevers at home.No chest pain and no palpitation. In ED, BP was 109/56, T max 100.6 F, HR 139, RR 14-22, oxygen saturation was 92% on [place] oxygen support. Blood work showed hypokalemia of 3.0, normal troponin level. She was given solumedrol and nebulizer treatment in ED and while she felt better she still had shortness of breath for which reason she was admitted for further evaluation and management of shortness of breath thought to be due to COPD and bronchitis.   Hospital Course:   Acute respiratory failure with hypoxia, acute COPD exacerbation with acute bronchitis and SIRS Significant improved patient was placed on Xopenex and Atrovent scheduled nebs with Pulmicort, flutter valve, IV Solu-Medrol and IV Zithromax. She has been transitioned to oral prednisone. Patient's O2 sats have remained in high 90s on room air. She had significant wheezing at the time of admission. Pulmonary appointment outpatient was arranged with Dr. [name] for follow-up.   Moderate mitral regurgitation - Per prior echo in 2015, EF 60-65% with moderate to severe mitral regurgitation   Atrial fibrillation - CHADS vasc score 4 - On anticoagulation with coumadin - Rate controlled with atenolol, patient had nonsustained bursts of RVR and bradycardia during admission. Digoxin is currently placed on hold due to  asymptomatic bradycardia, HR ranging 40-60's..  Acute on chronic Chronic CHF with moderate to severe mitral regurgitation, pulmonary hypertension Improved, shortness of breath is improving. Continue Lasix. - 2-D echo showed EF of 60-65%, normal wall motion, moderate tricuspid regurgitation, moderate to severe mitral valve regurgitation. Continue Lasix, outpatient follow-up with [name] arranged.   Hypokalemia - Currently stable, will continue potassium replacement for diuresis   Essential hypertension - Currently stable, continue atenolol  Day of Discharge BP 149/59 mmHg  Pulse 57  Temp(Src) 97.4 F (36.3 C) (Oral)  Resp  18  Ht 5\' 4"  (1.626 m)  Wt 66.225 kg (146 lb)  BMI 25.05 kg/m2  SpO2 96%  Physical Exam: General: Alert and awake oriented x3 not in any acute distress. HEENT: anicteric sclera, pupils reactive to light and accommodation CVS: S1-S2 clear no murmur rubs or gallops Chest: clear to auscultation bilaterally, no wheezing rales or rhonchi Abdomen: soft nontender, nondistended, normal bowel sounds Extremities: no cyanosis, clubbing or edema noted bilaterally Neuro: Cranial nerves II-XII intact, no focal neurological deficits   The results of significant diagnostics from this hospitalization (including imaging, microbiology, ancillary and laboratory) are listed below for reference.    LAB RESULTS: Basic Metabolic Panel:  Recent Labs Lab 03/11/15 1715  03/13/15 0555 03/14/15 0314  NA  --   < > 140 142  K  --   < > 4.4 3.7  CL  --   < > 101 104  CO2  --   < > 27 29  GLUCOSE  --   < > 136* 128*  BUN  --   < > 19 23*  CREATININE  --   < > 0.76 0.84  CALCIUM  --   < > 8.9 8.9  MG 2.0  --   --   --   < > = values in this interval not displayed. Liver Function Tests: No results for input(s): AST, ALT, ALKPHOS, BILITOT, PROT, ALBUMIN in the last 168 hours. No results for input(s): LIPASE, AMYLASE in the last 168 hours. No results for input(s): AMMONIA  in the last 168 hours. CBC:  Recent Labs Lab 03/11/15 0853 03/12/15 0251  WBC 7.6 11.2*  NEUTROABS 5.7  --   HGB 13.2 13.4  HCT 38.9 40.0  MCV 89.8 90.7  PLT 156 162   Cardiac Enzymes:  Recent Labs Lab 03/11/15 0853 03/11/15 1345  TROPONINI <0.03 <0.03   BNP: Invalid input(s): POCBNP CBG: No results for input(s): GLUCAP in the last 168 hours.  Significant Diagnostic Studies:  Dg Chest Port 1 View  03/11/2015   CLINICAL DATA:  Shortness of breath.  Wheezing.  Chronic bronchitis.  EXAM: PORTABLE CHEST - 1 VIEW  COMPARISON:  Radiographs dated 12/08/2013 and 11/17/2012 and chest CT dated 07/30/2007  FINDINGS: Heart size and pulmonary vascularity are normal. There is peribronchial thickening. Chronic elevation of the lateral aspect of the left hemidiaphragm.  No infiltrates or effusions.  IMPRESSION: Bronchitic changes.   Electronically Signed   By: Lorriane Shire M.D.   On: 03/11/2015 09:03    2D ECHO: Study Conclusions  - Left ventricle: The cavity size was normal. Wall thickness was increased in a pattern of moderate LVH. Systolic function was normal. The estimated ejection fraction was in the range of 60% to 65%. Wall motion was normal; there were no regional wall motion abnormalities. - Mitral valve: Calcified annulus. Mildly thickened leaflets . There was moderate to severe regurgitation. - Left atrium: Severely dilated at 75 ml/m2. - Right atrium: The atrium was mildly dilated. - Tricuspid valve: There was moderate regurgitation. - Pulmonary arteries: PA peak pressure: 41 mm Hg (S). - Inferior vena cava: The vessel was dilated. The respirophasic diameter changes were blunted (< 50%), consistent with elevated central venous pressure.  Impressions:  - Compared to the prior echo in 2015, there are few changes. The left atrium now measures 75 ml/m2. There is persistent moderate to severe mitral regurgitation.  Disposition and Follow-up:      Discharge Instructions    Diet - low sodium heart  healthy    Complete by:  As directed      Increase activity slowly    Complete by:  As directed             DISPOSITION: home    DISCHARGE FOLLOW-UP Follow-up Information    Follow up with [name], MD On 04/02/2015.   Specialty:  Family Medicine   Why:  @ 3:00 PM for hospital follow-up. Confirmed appointment with Constance Holster.   Contact information:   [place] [place] [address] [phone]       Follow up with Christinia Gully, MD On 03/24/2015.   Specialty:  Pulmonary Disease   Why:  at 11:00AM , for hospital follow-up   Contact information:   [address] [place] [address] [phone]       Follow up with Warren Danes, MD On 03/17/2015.   Specialty:  Cardiology   Why:  @ 8:30 AM for hospital follow-up. Confirmed appointment with Georgia Lopes information:   [address] [place] [address] [phone]        Time spent on Discharge: 35 mins   Signed:   Kelis Plasse M.D. [facility] 03/14/2015, 11:25 AM Pager: [phone]

## 2015-03-14 NOTE — Care Management Note (Signed)
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 She is independent of her ADL's, use no DME. Has private insurance with [employer] with prescription drug coverage and has no problem getting her medication at Pharmacy in [place].  Expected Discharge Date:    03/14/2015              Expected Discharge Plan:  Home/Self Care  Discharge planning Services  CM Consult  Status of Service:  In process, will continue to follow  Medicare Important Message Given:  Yes Date Medicare IM Given:  03/14/15 Medicare IM give by:  Mindi Slicker RN  Royston Bake, RN,BSN,MHA [id number] 03/14/2015, 11:12 AM

## 2015-03-14 NOTE — Progress Notes (Signed)
ANTICOAGULATION CONSULT NOTE  Pharmacy Consult for Warfarin Indication: atrial fibrillation  Vital Signs: Temp: 97.4 F (36.3 C) (06/17 0549) Temp Source: Oral (06/17 0549) BP: 140/70 mmHg (06/17 0549) Pulse Rate: 55 (06/17 0549)  Labs:  Recent Labs  03/11/15 0853 03/11/15 1345 03/12/15 0251 03/13/15 0555 03/14/15 0314  HGB 13.2  --  13.4  --   --   HCT 38.9  --  40.0  --   --   PLT 156  --  162  --   --   LABPROT  --   --  26.1* 34.9* 34.3*  INR  --   --  2.42* 3.57* 3.50*  CREATININE 0.82  --  0.69 0.76 0.84  TROPONINI <0.03 <0.03  --   --   --     Estimated Creatinine Clearance: 52.5 mL/min (by C-G formula based on Cr of 0.84).  Assessment: 59 yof to ED with CC of SOB. Pharmacy consulted to dose pta warfarin for afib. No bleed documented. CBC wnl. INR on admission 2.3.  INR today remains high at 3.50  PTA warfarin dose: 2mg  daily, except 1mg  on Wed   Goal of Therapy:  INR 2-3 Monitor platelets by anticoagulation protocol: Yes   Plan:  No coumadin today Daily INR  Mon s/sx bleed   Thanks for allowing pharmacy to be a part of this patient's care.  Excell Seltzer, PharmD Clinical Pharmacist, [phone] 03/14/2015 8:00 AM

## 2015-03-15 ENCOUNTER — Inpatient Hospital Stay (HOSPITAL_COMMUNITY)
Admission: EM | Admit: 2015-03-15 | Discharge: 2015-03-17 | DRG: 445 | Disposition: A | Discharge status: Home / Self Care | Payer: Medicare Other | Attending: Internal Medicine | Admitting: Internal Medicine

## 2015-03-15 ENCOUNTER — Encounter (HOSPITAL_COMMUNITY): Payer: Self-pay | Admitting: Emergency Medicine

## 2015-03-15 DIAGNOSIS — Z87891 Personal history of nicotine dependence: Secondary | ICD-10-CM

## 2015-03-15 DIAGNOSIS — I129 Hypertensive chronic kidney disease with stage 1 through stage 4 chronic kidney disease, or unspecified chronic kidney disease: Secondary | ICD-10-CM | POA: Diagnosis not present

## 2015-03-15 DIAGNOSIS — K8012 Calculus of gallbladder with acute and chronic cholecystitis without obstruction: Principal | ICD-10-CM | POA: Diagnosis present

## 2015-03-15 DIAGNOSIS — I340 Nonrheumatic mitral (valve) insufficiency: Secondary | ICD-10-CM | POA: Diagnosis present

## 2015-03-15 DIAGNOSIS — K819 Cholecystitis, unspecified: Secondary | ICD-10-CM

## 2015-03-15 DIAGNOSIS — R1011 Right upper quadrant pain: Secondary | ICD-10-CM

## 2015-03-15 DIAGNOSIS — N201 Calculus of ureter: Secondary | ICD-10-CM | POA: Diagnosis not present

## 2015-03-15 DIAGNOSIS — R109 Unspecified abdominal pain: Secondary | ICD-10-CM | POA: Diagnosis not present

## 2015-03-15 DIAGNOSIS — N189 Chronic kidney disease, unspecified: Secondary | ICD-10-CM | POA: Diagnosis present

## 2015-03-15 DIAGNOSIS — I10 Essential (primary) hypertension: Secondary | ICD-10-CM | POA: Diagnosis present

## 2015-03-15 DIAGNOSIS — I38 Endocarditis, valve unspecified: Secondary | ICD-10-CM | POA: Diagnosis present

## 2015-03-15 DIAGNOSIS — T45515A Adverse effect of anticoagulants, initial encounter: Secondary | ICD-10-CM | POA: Diagnosis present

## 2015-03-15 DIAGNOSIS — J449 Chronic obstructive pulmonary disease, unspecified: Secondary | ICD-10-CM | POA: Diagnosis not present

## 2015-03-15 DIAGNOSIS — N139 Obstructive and reflux uropathy, unspecified: Secondary | ICD-10-CM | POA: Diagnosis not present

## 2015-03-15 DIAGNOSIS — I482 Chronic atrial fibrillation, unspecified: Secondary | ICD-10-CM | POA: Diagnosis present

## 2015-03-15 DIAGNOSIS — J441 Chronic obstructive pulmonary disease with (acute) exacerbation: Secondary | ICD-10-CM | POA: Diagnosis present

## 2015-03-15 DIAGNOSIS — K810 Acute cholecystitis: Secondary | ICD-10-CM | POA: Diagnosis not present

## 2015-03-15 DIAGNOSIS — N179 Acute kidney failure, unspecified: Secondary | ICD-10-CM | POA: Diagnosis not present

## 2015-03-15 DIAGNOSIS — Z7901 Long term (current) use of anticoagulants: Secondary | ICD-10-CM | POA: Diagnosis not present

## 2015-03-15 DIAGNOSIS — D6832 Hemorrhagic disorder due to extrinsic circulating anticoagulants: Secondary | ICD-10-CM | POA: Diagnosis present

## 2015-03-15 DIAGNOSIS — N200 Calculus of kidney: Secondary | ICD-10-CM | POA: Diagnosis not present

## 2015-03-15 LAB — CBC WITH DIFFERENTIAL/PLATELET
Basophils Absolute: 0 10*3/uL (ref 0.0–0.1)
Basophils Relative: 0 % (ref 0–1)
Eosinophils Absolute: 0 10*3/uL (ref 0.0–0.7)
Eosinophils Relative: 0 % (ref 0–5)
HCT: 42.5 % (ref 36.0–46.0)
Hemoglobin: 14.7 g/dL (ref 12.0–15.0)
LYMPHS PCT: 10 % — AB (ref 12–46)
Lymphs Abs: 1.4 10*3/uL (ref 0.7–4.0)
MCH: 30.9 pg (ref 26.0–34.0)
MCHC: 34.6 g/dL (ref 30.0–36.0)
MCV: 89.3 fL (ref 78.0–100.0)
MONOS PCT: 7 % (ref 3–12)
Monocytes Absolute: 1 10*3/uL (ref 0.1–1.0)
Neutro Abs: 11.7 10*3/uL — ABNORMAL HIGH (ref 1.7–7.7)
Neutrophils Relative %: 83 % — ABNORMAL HIGH (ref 43–77)
PLATELETS: 248 10*3/uL (ref 150–400)
RBC: 4.76 MIL/uL (ref 3.87–5.11)
RDW: 13.5 % (ref 11.5–15.5)
WBC: 14.1 10*3/uL — AB (ref 4.0–10.5)

## 2015-03-15 LAB — URINALYSIS, ROUTINE W REFLEX MICROSCOPIC
BILIRUBIN URINE: NEGATIVE
Glucose, UA: NEGATIVE mg/dL
Hgb urine dipstick: NEGATIVE
Ketones, ur: NEGATIVE mg/dL
LEUKOCYTES UA: NEGATIVE
NITRITE: NEGATIVE
Protein, ur: NEGATIVE mg/dL
Specific Gravity, Urine: 1.011 (ref 1.005–1.030)
UROBILINOGEN UA: 0.2 mg/dL (ref 0.0–1.0)
pH: 7 (ref 5.0–8.0)

## 2015-03-15 LAB — COMPREHENSIVE METABOLIC PANEL
ALT: 63 U/L — ABNORMAL HIGH (ref 14–54)
ANION GAP: 10 (ref 5–15)
AST: 62 U/L — ABNORMAL HIGH (ref 15–41)
Albumin: 3.8 g/dL (ref 3.5–5.0)
Alkaline Phosphatase: 62 U/L (ref 38–126)
BILIRUBIN TOTAL: 1 mg/dL (ref 0.3–1.2)
BUN: 28 mg/dL — ABNORMAL HIGH (ref 6–20)
CO2: 28 mmol/L (ref 22–32)
CREATININE: 1.21 mg/dL — AB (ref 0.44–1.00)
Calcium: 8.8 mg/dL — ABNORMAL LOW (ref 8.9–10.3)
Chloride: 100 mmol/L — ABNORMAL LOW (ref 101–111)
GFR calc Af Amer: 49 mL/min — ABNORMAL LOW (ref >60)
GFR calc non Af Amer: 42 mL/min — ABNORMAL LOW (ref >60)
GLUCOSE: 130 mg/dL — AB (ref 65–99)
Potassium: 4.1 mmol/L (ref 3.5–5.1)
Sodium: 138 mmol/L (ref 135–145)
Total Protein: 6.8 g/dL (ref 6.5–8.1)

## 2015-03-15 MED ORDER — FENTANYL CITRATE (PF) 100 MCG/2ML IJ SOLN
100.0000 ug | Freq: Once | INTRAMUSCULAR | Status: AC
  Administered 2015-03-16: 100 ug via INTRAVENOUS
  Filled 2015-03-15: qty 2

## 2015-03-15 MED ORDER — SODIUM CHLORIDE 0.9 % IV BOLUS (SEPSIS)
500.0000 mL | Freq: Once | INTRAVENOUS | Status: AC
  Administered 2015-03-16: 500 mL via INTRAVENOUS

## 2015-03-15 MED ORDER — ONDANSETRON HCL 4 MG/2ML IJ SOLN
4.0000 mg | Freq: Once | INTRAMUSCULAR | Status: AC
  Administered 2015-03-16: 4 mg via INTRAVENOUS
  Filled 2015-03-15: qty 2

## 2015-03-15 NOTE — ED Provider Notes (Signed)
CSN: [id number]     Arrival date & time 03/15/15  2138 History   First MD Initiated Contact with Patient 03/15/15 2332     Chief Complaint  Patient presents with  . Flank Pain     (Consider location/radiation/quality/duration/timing/severity/associated sxs/prior Treatment) HPI Comments: Patient is a 80 yo F PMHx significant for HTN, A fib on anticoagulation, CKD presenting to the ED for acute onset left sided flank pain that has now radiated into left lower quadrant. She states the pain began this afternoon. She describes it as sharp intermittent pain. Waxing and waning. Patient states it feels previous history of kidney stones. Denies ever having lithotripsy or stent placed. Denies any fevers, vomiting, dysuria, hematuria. Recently discharged from hospital yesterday for respiratory infection on Zithromax.    Past Medical History  Diagnosis Date  . Hypertension   . Atrial fibrillation, chronic   . Heart murmur   . Arthritis   . UTI (urinary tract infection)   . Chronic kidney disease   . Valvular heart disease     Has mitral stenosis with prior mitral commissurotomy in 1970  . H/O: rheumatic fever   . Chronic anticoagulation    Past Surgical History  Procedure Laterality Date  . Cardiac catheterization    . Back surgery      neurosurgery  . Commissurotomy  1970  . Other surgical history      hysterectomy  . Cataract surg    . Abdominal hysterectomy  1983    endometriosis   Family History  Problem Relation Age of Onset  . Leukemia Father    History  Substance Use Topics  . Smoking status: Former Research scientist (life sciences)  . Smokeless tobacco: Never Used  . Alcohol Use: No   OB History    No data available     Review of Systems  Constitutional: Negative for fever.  Gastrointestinal: Positive for nausea and abdominal pain. Negative for vomiting and diarrhea.  Genitourinary: Positive for flank pain.  All other systems reviewed and are negative.     Allergies  Ace inhibitors;  Aldactone; Benazepril hcl; Ciprocin-fluocin-procin; Ciprofloxacin; Codeine; Diltiazem; Flagyl; Flovent; Gabapentin; Lyrica; Nitrofurantoin monohyd macro; Quinidine; Simvastatin; Tramadol; Verapamil; and Amoxicillin  Home Medications   Prior to Admission medications   Medication Sig Start Date End Date Taking? Authorizing Provider  acetaminophen (TYLENOL) 500 MG tablet Take 1,000 mg by mouth every 8 (eight) hours as needed for mild pain or headache.   Yes Historical Provider, MD  ACETYLCARNITINE HCL PO Take 1 capsule by mouth 3 (three) times daily.   Yes Historical Provider, MD  albuterol (PROVENTIL HFA;VENTOLIN HFA) 108 (90 BASE) MCG/ACT inhaler Inhale 2 puffs into the lungs every 6 (six) hours as needed for wheezing or shortness of breath. 01/15/15  Yes [name], MD  atenolol (TENORMIN) 25 MG tablet Take 12.5 mg by mouth daily.   Yes Historical Provider, MD  azithromycin (ZITHROMAX) 500 MG tablet Take 1 tablet (500 mg total) by mouth daily. X5 DAYS 03/14/15  Yes Ripudeep Krystal Eaton, MD  B Complex-C (B-COMPLEX WITH VITAMIN C) tablet Take 1 tablet by mouth daily.   Yes Historical Provider, MD  benzonatate (TESSALON) 100 MG capsule Take 1 capsule (100 mg total) by mouth 3 (three) times daily as needed for cough. 03/14/15  Yes Ripudeep Krystal Eaton, MD  budesonide-formoterol (SYMBICORT) 80-4.5 MCG/ACT inhaler Inhale 2 puffs into the lungs 2 (two) times daily. 03/14/15  Yes Ripudeep Krystal Eaton, MD  calcium-vitamin D (OSCAL WITH D) 250-125 MG-UNIT per  tablet Take 1 tablet by mouth 2 (two) times daily.   Yes Historical Provider, MD  diazepam (VALIUM) 5 MG tablet Take 2.5 mg by mouth daily as needed for anxiety (for neuropathy).   Yes Historical Provider, MD  docusate sodium (COLACE) 100 MG capsule Take 1 capsule (100 mg total) by mouth 2 (two) times daily. 03/14/15  Yes Ripudeep Krystal Eaton, MD  furosemide (LASIX) 80 MG tablet Take 40 mg by mouth 2 (two) times daily.   Yes Historical Provider, MD  guaifenesin (ROBITUSSIN)  100 MG/5ML syrup Take 10 mLs (200 mg total) by mouth every 4 (four) hours as needed for congestion. 03/14/15  Yes Ripudeep Krystal Eaton, MD  Multiple Vitamin (MULTIVITAMIN) tablet Take 1 tablet by mouth daily.     Yes Historical Provider, MD  POTASSIUM CHLORIDE PO Take 5 mEq by mouth See admin instructions. Take 10 times daily. 5mEq total/day   Yes Historical Provider, MD  predniSONE (DELTASONE) 10 MG tablet Prednisone dosing: Take  Prednisone 40mg  (4 tabs) x 3 days, then taper to 30mg  (3 tabs) x 3 days, then 20mg  (2 tabs) x 3days, then 10mg  (1 tab) x 3days, then OFF.  Dispense:  30 tabs, refills: None 03/14/15  Yes Ripudeep K Rai, MD  triamterene-hydrochlorothiazide (MAXZIDE-25) 37.5-25 MG per tablet Take 1 tablet by mouth daily.   Yes Historical Provider, MD  warfarin (COUMADIN) 2 MG tablet Please see discharge instructions. HOLD 6/17, and 6/18. Take 1 mg on 6/19 evening. Follow with the Coumadin clinic on Monday. Patient taking differently: Take 1-2 mg by mouth See admin instructions. Take 0.5 tablet (1mg ) on Wednesday. Take 1 tablet (2mg ) all other days. 03/14/15  Yes Ripudeep K Rai, MD   BP 134/50 mmHg  Pulse 41  Temp(Src) 97.9 F (36.6 C) (Oral)  Resp 15  Ht 5\' 4"  (1.626 m)  Wt 144 lb (65.318 kg)  BMI 24.71 kg/m2  SpO2 98% Physical Exam  Constitutional: She is oriented to person, place, and time. She appears well-developed and well-nourished. No distress.  HENT:  Head: Normocephalic and atraumatic.  Right Ear: External ear normal.  Left Ear: External ear normal.  Nose: Nose normal.  Mouth/Throat: No oropharyngeal exudate.  Eyes: Conjunctivae are normal.  Neck: Neck supple.  Cardiovascular: Normal rate, regular rhythm and normal heart sounds.   Pulmonary/Chest: Effort normal and breath sounds normal.  Abdominal: Soft. Bowel sounds are normal. She exhibits no distension. There is tenderness. There is no rigidity, no rebound, no guarding and no CVA tenderness.    Musculoskeletal:        Back:  MAE x 4  Neurological: She is alert and oriented to person, place, and time.  Skin: Skin is warm and dry. She is not diaphoretic.  Nursing note and vitals reviewed.   ED Course  Procedures (including critical care time) Medications  metroNIDAZOLE (FLAGYL) IVPB 500 mg (500 mg Intravenous New Bag/Given 03/16/15 0450)  sodium chloride 0.9 % bolus 500 mL (0 mLs Intravenous Stopped 03/16/15 0035)  fentaNYL (SUBLIMAZE) injection 100 mcg (100 mcg Intravenous Given 03/16/15 0023)  ondansetron (ZOFRAN) injection 4 mg (4 mg Intravenous Given 03/16/15 0023)  ondansetron (ZOFRAN) injection 4 mg (4 mg Intravenous Given 03/16/15 0209)    Labs Review Labs Reviewed  CBC WITH DIFFERENTIAL/PLATELET - Abnormal; Notable for the following:    WBC 14.1 (*)    Neutrophils Relative % 83 (*)    Lymphocytes Relative 10 (*)    Neutro Abs 11.7 (*)    All other components within  normal limits  COMPREHENSIVE METABOLIC PANEL - Abnormal; Notable for the following:    Chloride 100 (*)    Glucose, Bld 130 (*)    BUN 28 (*)    Creatinine, Ser 1.21 (*)    Calcium 8.8 (*)    AST 62 (*)    ALT 63 (*)    GFR calc non Af Amer 42 (*)    GFR calc Af Amer 49 (*)    All other components within normal limits  PROTIME-INR - Abnormal; Notable for the following:    Prothrombin Time 23.5 (*)    INR 2.11 (*)    All other components within normal limits  URINE CULTURE  URINALYSIS, ROUTINE W REFLEX MICROSCOPIC (NOT AT [facility])    Imaging Review Ct Renal Stone Study  03/16/2015   CLINICAL DATA:  80 year old female with left flank pain radiating to the abdomen with Dysuria and chills. Initial encounter.  EXAM: CT ABDOMEN AND PELVIS WITHOUT CONTRAST  TECHNIQUE: Multidetector CT imaging of the abdomen and pelvis was performed following the standard protocol without IV contrast.  COMPARISON:  CT Abdomen and Pelvis 02/09/2009  FINDINGS: Progressed cardiomegaly since 2010. No pericardial effusion. Mild motion artifact at the  lung bases. No pleural effusion.  Postoperative changes at the lumbosacral junction are new since the prior exam. No acute osseous abnormality identified.  No pelvic free fluid. Surgically absent uterus. Redundant but otherwise negative distal colon with retained stool.  Retained stool throughout the more proximal colon. No large bowel inflammation. No dilated small bowel. Decompressed stomach and duodenum.  Bulky gallstone within the gallbladder, 2 cm diameter. Motion artifact at the level of the gallbladder fossa, no definite pericholecystic inflammation.  Negative non contrast liver, spleen, pancreas, and adrenal glands. Aortoiliac calcified atherosclerosis noted.  Negative non contrast right kidney and ureter.  Moderate to severe left perinephric stranding with nephro megaly and hydronephrosis. Left periureteral stranding. Decompressed distal left ureter, however, there is a 3 mm calculus just inside the bladder near the left UVJ (series 2, image 71). Small volume left para renal fluid. Punctate left midpole and lower pole nephrolithiasis.  IMPRESSION: 1. Sequelae of acute obstructive uropathy on the left with forniceal rupture. 3 mm calculus at the left UVJ or just inside the bladder. 2. Chronic large gallstone, no definite gallbladder inflammation. 3. Progressed cardiomegaly since 2010.   Electronically Signed   By: Genevie Ann M.D.   On: 03/16/2015 00:30   US Abdomen Limited Ruq  03/16/2015   CLINICAL DATA:  80 year old female with flank pain and cholelithiasis. Initial encounter.  EXAM: US ABDOMEN LIMITED - RIGHT UPPER QUADRANT  COMPARISON:  CT Abdomen and Pelvis 0011 hr the same day.  FINDINGS: Gallbladder:  2.6 cm diameter gallstone with shadowing. Additional tiny gallstones versus polyps (image 21). Gallbladder wall thickening of 5 mm and positive sonographic Murphy's sign. No pericholecystic fluid identified.  Common bile duct:  Diameter: 2 mm, normal  Liver:  Mildly heterogeneous echotexture. No discrete  liver lesion or intrahepatic biliary ductal dilatation.  IMPRESSION: 1. Positive for Acute Cholecystitis: Large 2.6 cm gallstone with possible tiny additional stones. Gallbladder wall thickening and sonographic Murphy's sign. 2. No evidence of biliary obstruction.   Electronically Signed   By: Genevie Ann M.D.   On: 03/16/2015 02:56     EKG Interpretation   Date/Time:  Sunday March 16 2015 00:29:37 EDT Ventricular Rate:  47 PR Interval:    QRS Duration: 90 QT Interval:  505 QTC Calculation: 446 R Axis:  14 Text Interpretation:  Atrial fibrillation Anterior infarct, old Borderline  ST depression, diffuse leads No significant change was found Confirmed by  [facility]  MD, TREY ([id number]) on 03/16/2015 12:40:37 AM      Discussed patient case with Dr. Baltazar Najjar of urology who recommends outpatient follow-up. Discussed CT scan results with patient, discussed 2 cm gallstone in gallbladder with mild bump in LFTs. Patient is now reporting that she has had intermittent right sided upper abdominal pain on-and-off for the last week or so. She has seen a general surgeon in the past was advised she would not need surgery. Will obtain US.    Discussed [name] results with patient. Given multiple medication allergies will start on IV Flagyl as most benign adverse reaction.   Patient will be admitted to medical service given A fib on anticoagulation for Coumadin reversal.  Dr. Grandville Silos consultation and called to see patient on the floor in the a.m. for further evaluation and management of cholecystitis. MDM   Final diagnoses:  Left flank pain  RUQ pain  Cholecystitis    Filed Vitals:   03/16/15 0430  BP: 134/50  Pulse: 41  Temp:   Resp: 15   I have reviewed nursing notes, vital signs, and all appropriate lab and imaging results if ordered as above.   Afebrile, NAD, non-toxic appearing, AAOx4.   Patient noted to be bradycardic for most of duration of stay in the ED, but asymptomatic. IVF given.   Patient  will be admitted to the medical service for acute cholecystitis with general surgery consultation. IV antibiotics hung for cholecystitis. Pain, nausea, vomiting controlled. Will also be watched and managed for ureteral stone. Patient d/w with Dr. Doy Mince, agrees with plan.    Baron Sane, PA-C 03/16/15 [id number]  Serita Grit, MD 03/17/15 [phone]

## 2015-03-15 NOTE — ED Notes (Signed)
Pt. reports left flank pain radiating to lower abdomen onset this evening with dysuria and chills. Pt. stated pain is similar to her kidney stone in the past.

## 2015-03-16 ENCOUNTER — Emergency Department (HOSPITAL_COMMUNITY): Payer: Medicare Other

## 2015-03-16 ENCOUNTER — Encounter (HOSPITAL_COMMUNITY): Payer: Self-pay

## 2015-03-16 DIAGNOSIS — K8020 Calculus of gallbladder without cholecystitis without obstruction: Secondary | ICD-10-CM | POA: Diagnosis not present

## 2015-03-16 DIAGNOSIS — D6832 Hemorrhagic disorder due to extrinsic circulating anticoagulants: Secondary | ICD-10-CM | POA: Diagnosis present

## 2015-03-16 DIAGNOSIS — I129 Hypertensive chronic kidney disease with stage 1 through stage 4 chronic kidney disease, or unspecified chronic kidney disease: Secondary | ICD-10-CM | POA: Diagnosis present

## 2015-03-16 DIAGNOSIS — K810 Acute cholecystitis: Secondary | ICD-10-CM | POA: Diagnosis not present

## 2015-03-16 DIAGNOSIS — K8012 Calculus of gallbladder with acute and chronic cholecystitis without obstruction: Secondary | ICD-10-CM | POA: Diagnosis present

## 2015-03-16 DIAGNOSIS — N189 Chronic kidney disease, unspecified: Secondary | ICD-10-CM | POA: Diagnosis present

## 2015-03-16 DIAGNOSIS — R1011 Right upper quadrant pain: Secondary | ICD-10-CM | POA: Diagnosis not present

## 2015-03-16 DIAGNOSIS — N200 Calculus of kidney: Secondary | ICD-10-CM

## 2015-03-16 DIAGNOSIS — Z87891 Personal history of nicotine dependence: Secondary | ICD-10-CM | POA: Diagnosis not present

## 2015-03-16 DIAGNOSIS — R109 Unspecified abdominal pain: Secondary | ICD-10-CM

## 2015-03-16 DIAGNOSIS — N201 Calculus of ureter: Secondary | ICD-10-CM | POA: Diagnosis present

## 2015-03-16 DIAGNOSIS — J441 Chronic obstructive pulmonary disease with (acute) exacerbation: Secondary | ICD-10-CM | POA: Diagnosis present

## 2015-03-16 DIAGNOSIS — I482 Chronic atrial fibrillation, unspecified: Secondary | ICD-10-CM | POA: Diagnosis present

## 2015-03-16 DIAGNOSIS — Z7901 Long term (current) use of anticoagulants: Secondary | ICD-10-CM | POA: Diagnosis not present

## 2015-03-16 DIAGNOSIS — J439 Emphysema, unspecified: Secondary | ICD-10-CM

## 2015-03-16 DIAGNOSIS — K819 Cholecystitis, unspecified: Secondary | ICD-10-CM | POA: Diagnosis not present

## 2015-03-16 DIAGNOSIS — I38 Endocarditis, valve unspecified: Secondary | ICD-10-CM | POA: Diagnosis present

## 2015-03-16 DIAGNOSIS — I340 Nonrheumatic mitral (valve) insufficiency: Secondary | ICD-10-CM | POA: Diagnosis present

## 2015-03-16 DIAGNOSIS — J449 Chronic obstructive pulmonary disease, unspecified: Secondary | ICD-10-CM | POA: Diagnosis present

## 2015-03-16 DIAGNOSIS — I10 Essential (primary) hypertension: Secondary | ICD-10-CM | POA: Diagnosis not present

## 2015-03-16 DIAGNOSIS — D699 Hemorrhagic condition, unspecified: Secondary | ICD-10-CM

## 2015-03-16 DIAGNOSIS — T45515A Adverse effect of anticoagulants, initial encounter: Secondary | ICD-10-CM | POA: Diagnosis present

## 2015-03-16 DIAGNOSIS — N179 Acute kidney failure, unspecified: Secondary | ICD-10-CM | POA: Diagnosis present

## 2015-03-16 DIAGNOSIS — N139 Obstructive and reflux uropathy, unspecified: Secondary | ICD-10-CM | POA: Diagnosis not present

## 2015-03-16 LAB — PROTIME-INR
INR: 1.48 (ref 0.00–1.49)
INR: 2.11 — AB (ref 0.00–1.49)
PROTHROMBIN TIME: 23.5 s — AB (ref 11.6–15.2)
Prothrombin Time: 18 seconds — ABNORMAL HIGH (ref 11.6–15.2)

## 2015-03-16 MED ORDER — HYDROMORPHONE HCL 1 MG/ML IJ SOLN: 0.5000 mg | INTRAMUSCULAR | Status: DC | PRN

## 2015-03-16 MED ORDER — ACETAMINOPHEN 325 MG PO TABS: 650.0000 mg | ORAL_TABLET | Freq: Four times a day (QID) | ORAL | Status: DC | PRN

## 2015-03-16 MED ORDER — IPRATROPIUM-ALBUTEROL 0.5-2.5 (3) MG/3ML IN SOLN
3.0000 mL | RESPIRATORY_TRACT | Status: DC
  Administered 2015-03-16 (×2): 3 mL via RESPIRATORY_TRACT
  Filled 2015-03-16 (×3): qty 3

## 2015-03-16 MED ORDER — IPRATROPIUM-ALBUTEROL 0.5-2.5 (3) MG/3ML IN SOLN
3.0000 mL | RESPIRATORY_TRACT | Status: DC | PRN
Start: 2015-03-16 — End: 2015-03-17
  Administered 2015-03-17: 3 mL via RESPIRATORY_TRACT
  Filled 2015-03-16: qty 3

## 2015-03-16 MED ORDER — VITAMIN K1 10 MG/ML IJ SOLN
5.0000 mg | Freq: Once | INTRAVENOUS | Status: AC
  Administered 2015-03-16: 5 mg via INTRAVENOUS
  Filled 2015-03-16: qty 0.5

## 2015-03-16 MED ORDER — SODIUM CHLORIDE 0.9 % IV SOLN
INTRAVENOUS | Status: DC
  Administered 2015-03-16 – 2015-03-17 (×2): via INTRAVENOUS

## 2015-03-16 MED ORDER — PANTOPRAZOLE SODIUM 40 MG IV SOLR
40.0000 mg | INTRAVENOUS | Status: DC
  Administered 2015-03-16 – 2015-03-17 (×2): 40 mg via INTRAVENOUS
  Filled 2015-03-16 (×3): qty 40

## 2015-03-16 MED ORDER — SODIUM CHLORIDE 0.9 % IJ SOLN
3.0000 mL | Freq: Two times a day (BID) | INTRAMUSCULAR | Status: DC
  Administered 2015-03-17: 3 mL via INTRAVENOUS

## 2015-03-16 MED ORDER — ONDANSETRON HCL 4 MG/2ML IJ SOLN: 4.0000 mg | Freq: Four times a day (QID) | INTRAMUSCULAR | Status: DC | PRN

## 2015-03-16 MED ORDER — HEPARIN (PORCINE) IN NACL 100-0.45 UNIT/ML-% IJ SOLN
700.0000 [IU]/h | INTRAMUSCULAR | Status: DC
  Administered 2015-03-16: 900 [IU]/h via INTRAVENOUS
  Filled 2015-03-16 (×2): qty 250

## 2015-03-16 MED ORDER — ONDANSETRON HCL 4 MG PO TABS: 4.0000 mg | ORAL_TABLET | Freq: Four times a day (QID) | ORAL | Status: DC | PRN

## 2015-03-16 MED ORDER — ZOLPIDEM TARTRATE 5 MG PO TABS
5.0000 mg | ORAL_TABLET | Freq: Once | ORAL | Status: DC
  Filled 2015-03-16: qty 1

## 2015-03-16 MED ORDER — MORPHINE SULFATE 4 MG/ML IJ SOLN: 4.0000 mg | Freq: Once | INTRAMUSCULAR | Status: DC

## 2015-03-16 MED ORDER — ONDANSETRON HCL 4 MG/2ML IJ SOLN
4.0000 mg | Freq: Once | INTRAMUSCULAR | Status: AC
  Administered 2015-03-16: 4 mg via INTRAVENOUS
  Filled 2015-03-16: qty 2

## 2015-03-16 MED ORDER — ACETAMINOPHEN 650 MG RE SUPP: 650.0000 mg | Freq: Four times a day (QID) | RECTAL | Status: DC | PRN

## 2015-03-16 MED ORDER — METRONIDAZOLE IN NACL 5-0.79 MG/ML-% IV SOLN
500.0000 mg | Freq: Three times a day (TID) | INTRAVENOUS | Status: DC
  Administered 2015-03-16 – 2015-03-17 (×4): 500 mg via INTRAVENOUS
  Filled 2015-03-16 (×6): qty 100

## 2015-03-16 MED ORDER — METRONIDAZOLE IN NACL 5-0.79 MG/ML-% IV SOLN
500.0000 mg | Freq: Once | INTRAVENOUS | Status: AC
  Administered 2015-03-16: 500 mg via INTRAVENOUS
  Filled 2015-03-16: qty 100

## 2015-03-16 NOTE — Consult Note (Signed)
Reason for Consult:  Possible acute cholecystitis    Catherine [name] is an 80 y.o. female.   HPI: Patient is a 80 year old female admitted with acute abdominal pain. She was actually just recently hospitalized for acute bronchitis. Discharged just 1 day prior to this admission. After being discharged she  developed the acute onset of Left flank pain radiating around to her left lower quadrant. The patient has a previous history of kidney stones on the right side and this felt to be the same type of pain. It was persistent and quite severe and she re-presented to the emergency room. Evaluation by imaging as described below indicate likely a recently passed left ureteral stone and also cholelithiasis with possible cholecystitis. The patient has had known gallstones for many years. They have been felt to be asymptomatic and surgery was not recommended previously. On questioning the patient has been having some episodic right upper quadrant discomfort for about 3 months. She describes a brief sharp moderate pain just beneath the right rib cage. This seems to be related mostly to position such as sitting up or leaning forward and not related to eating. No associated nausea or vomiting or fever or chills. Since admission the patient is feeling significantly better.  Past Medical History  Diagnosis Date  . Hypertension   . Atrial fibrillation, chronic   . Heart murmur   . Arthritis   . UTI (urinary tract infection)   . Chronic kidney disease   . Valvular heart disease     Has mitral stenosis with prior mitral commissurotomy in 1970  . H/O: rheumatic fever   . Chronic anticoagulation     Past Surgical History  Procedure Laterality Date  . Cardiac catheterization    . Back surgery      neurosurgery  . Commissurotomy  1970  . Other surgical history      hysterectomy  . Cataract surg    . Abdominal hysterectomy  1983    endometriosis    Family History  Problem Relation Age of Onset  .  Leukemia Father     Social History:  reports that she has quit smoking. She has never used smokeless tobacco. She reports that she does not drink alcohol or use illicit drugs.  Allergies:  Allergies  Allergen Reactions  . Ace Inhibitors Other (See Comments)    unknown  . Aldactone [Spironolactone]     dyspnea  . Benazepril Hcl     cough  . Ciprocin-Fluocin-Procin [Fluocinolone Acetonide] Other (See Comments)    unknown  . Ciprofloxacin Diarrhea  . Codeine Nausea Only  . Diltiazem     ha  . Flagyl [Metronidazole Hcl] Other (See Comments)    headache  . Flovent [Fluticasone Propionate]     Leg cramps  . Gabapentin Swelling  . Lyrica [Pregabalin] Swelling  . Nitrofurantoin Monohyd Macro     nausea  . Quinidine     Fever diarrhea  . Simvastatin     Leg pain   . Tramadol Nausea Only  . Verapamil     myalgias  . Amoxicillin Palpitations    tachy    Current Facility-Administered Medications  Medication Dose Route Frequency Provider Last Rate Last Dose  . 0.9 %  sodium chloride infusion   Intravenous Continuous Theressa Millard, MD 50 mL/hr at 03/16/15 0617    . acetaminophen (TYLENOL) tablet 650 mg  650 mg Oral Q6H PRN Theressa Millard, MD       Or  . acetaminophen (TYLENOL)  suppository 650 mg  650 mg Rectal Q6H PRN Theressa Millard, MD      . HYDROmorphone (DILAUDID) injection 0.5-1 mg  0.5-1 mg Intravenous Q3H PRN Theressa Millard, MD      . ipratropium-albuterol (DUONEB) 0.5-2.5 (3) MG/3ML nebulizer solution 3 mL  3 mL Nebulization Q4H Theressa Millard, MD   3 mL at 03/16/15 0928  . metroNIDAZOLE (FLAGYL) IVPB 500 mg  500 mg Intravenous Q8H Harvette C Jenkins, MD      . ondansetron (ZOFRAN) tablet 4 mg  4 mg Oral Q6H PRN Theressa Millard, MD       Or  . ondansetron (ZOFRAN) injection 4 mg  4 mg Intravenous Q6H PRN Theressa Millard, MD      . pantoprazole (PROTONIX) injection 40 mg  40 mg Intravenous Q24H Theressa Millard, MD   40 mg at 03/16/15 2706  .  sodium chloride 0.9 % injection 3 mL  3 mL Intravenous Q12H Theressa Millard, MD   3 mL at 03/16/15 1000     Results for orders placed or performed during the hospital encounter of 03/15/15 (from the past 48 hour(s))  Urinalysis, Routine w reflex microscopic (not at [facility])     Status: None   Collection Time: 03/15/15  9:51 PM  Result Value Ref Range   Color, Urine YELLOW YELLOW   APPearance CLEAR CLEAR   Specific Gravity, Urine 1.011 1.005 - 1.030   pH 7.0 5.0 - 8.0   Glucose, UA NEGATIVE NEGATIVE mg/dL   Hgb urine dipstick NEGATIVE NEGATIVE   Bilirubin Urine NEGATIVE NEGATIVE   Ketones, ur NEGATIVE NEGATIVE mg/dL   Protein, ur NEGATIVE NEGATIVE mg/dL   Urobilinogen, UA 0.2 0.0 - 1.0 mg/dL   Nitrite NEGATIVE NEGATIVE   Leukocytes, UA NEGATIVE NEGATIVE    Comment: MICROSCOPIC NOT DONE ON URINES WITH NEGATIVE PROTEIN, BLOOD, LEUKOCYTES, NITRITE, OR GLUCOSE <1000 mg/dL.  CBC with Differential     Status: Abnormal   Collection Time: 03/15/15  9:51 PM  Result Value Ref Range   WBC 14.1 (H) 4.0 - 10.5 K/uL   RBC 4.76 3.87 - 5.11 MIL/uL   Hemoglobin 14.7 12.0 - 15.0 g/dL   HCT 42.5 36.0 - 46.0 %   MCV 89.3 78.0 - 100.0 fL   MCH 30.9 26.0 - 34.0 pg   MCHC 34.6 30.0 - 36.0 g/dL   RDW 13.5 11.5 - 15.5 %   Platelets 248 150 - 400 K/uL   Neutrophils Relative % 83 (H) 43 - 77 %   Lymphocytes Relative 10 (L) 12 - 46 %   Monocytes Relative 7 3 - 12 %   Eosinophils Relative 0 0 - 5 %   Basophils Relative 0 0 - 1 %   Neutro Abs 11.7 (H) 1.7 - 7.7 K/uL   Lymphs Abs 1.4 0.7 - 4.0 K/uL   Monocytes Absolute 1.0 0.1 - 1.0 K/uL   Eosinophils Absolute 0.0 0.0 - 0.7 K/uL   Basophils Absolute 0.0 0.0 - 0.1 K/uL   Smear Review LARGE PLATELETS PRESENT     Comment: PLATELETS APPEAR ADEQUATE MORPHOLOGY UNREMARKABLE   Comprehensive metabolic panel     Status: Abnormal   Collection Time: 03/15/15  9:51 PM  Result Value Ref Range   Sodium 138 135 - 145 mmol/L   Potassium 4.1 3.5 - 5.1 mmol/L    Chloride 100 (L) 101 - 111 mmol/L   CO2 28 22 - 32 mmol/L   Glucose, Bld 130 (H) 65 -  99 mg/dL   BUN 28 (H) 6 - 20 mg/dL   Creatinine, Ser 1.21 (H) 0.44 - 1.00 mg/dL   Calcium 8.8 (L) 8.9 - 10.3 mg/dL   Total Protein 6.8 6.5 - 8.1 g/dL   Albumin 3.8 3.5 - 5.0 g/dL   AST 62 (H) 15 - 41 U/L   ALT 63 (H) 14 - 54 U/L   Alkaline Phosphatase 62 38 - 126 U/L   Total Bilirubin 1.0 0.3 - 1.2 mg/dL   GFR calc non Af Amer 42 (L) >60 mL/min   GFR calc Af Amer 49 (L) >60 mL/min    Comment: (NOTE) The eGFR has been calculated using the CKD EPI equation. This calculation has not been validated in all clinical situations. eGFR's persistently <60 mL/min signify possible Chronic Kidney Disease.    Anion gap 10 5 - 15  Protime-INR     Status: Abnormal   Collection Time: 03/15/15 11:55 PM  Result Value Ref Range   Prothrombin Time 23.5 (H) 11.6 - 15.2 seconds   INR 2.11 (H) 0.00 - 1.49    Ct Renal Stone Study  03/16/2015   CLINICAL DATA:  80 year old female with left flank pain radiating to the abdomen with Dysuria and chills. Initial encounter.  EXAM: CT ABDOMEN AND PELVIS WITHOUT CONTRAST  TECHNIQUE: Multidetector CT imaging of the abdomen and pelvis was performed following the standard protocol without IV contrast.  COMPARISON:  CT Abdomen and Pelvis 02/09/2009  FINDINGS: Progressed cardiomegaly since 2010. No pericardial effusion. Mild motion artifact at the lung bases. No pleural effusion.  Postoperative changes at the lumbosacral junction are new since the prior exam. No acute osseous abnormality identified.  No pelvic free fluid. Surgically absent uterus. Redundant but otherwise negative distal colon with retained stool.  Retained stool throughout the more proximal colon. No large bowel inflammation. No dilated small bowel. Decompressed stomach and duodenum.  Bulky gallstone within the gallbladder, 2 cm diameter. Motion artifact at the level of the gallbladder fossa, no definite pericholecystic  inflammation.  Negative non contrast liver, spleen, pancreas, and adrenal glands. Aortoiliac calcified atherosclerosis noted.  Negative non contrast right kidney and ureter.  Moderate to severe left perinephric stranding with nephro megaly and hydronephrosis. Left periureteral stranding. Decompressed distal left ureter, however, there is a 3 mm calculus just inside the bladder near the left UVJ (series 2, image 71). Small volume left para renal fluid. Punctate left midpole and lower pole nephrolithiasis.  IMPRESSION: 1. Sequelae of acute obstructive uropathy on the left with forniceal rupture. 3 mm calculus at the left UVJ or just inside the bladder. 2. Chronic large gallstone, no definite gallbladder inflammation. 3. Progressed cardiomegaly since 2010.   Electronically Signed   By: Genevie Ann M.D.   On: 03/16/2015 00:30   US Abdomen Limited Ruq  03/16/2015   CLINICAL DATA:  80 year old female with flank pain and cholelithiasis. Initial encounter.  EXAM: US ABDOMEN LIMITED - RIGHT UPPER QUADRANT  COMPARISON:  CT Abdomen and Pelvis 0011 hr the same day.  FINDINGS: Gallbladder:  2.6 cm diameter gallstone with shadowing. Additional tiny gallstones versus polyps (image 21). Gallbladder wall thickening of 5 mm and positive sonographic Murphy's sign. No pericholecystic fluid identified.  Common bile duct:  Diameter: 2 mm, normal  Liver:  Mildly heterogeneous echotexture. No discrete liver lesion or intrahepatic biliary ductal dilatation.  IMPRESSION: 1. Positive for Acute Cholecystitis: Large 2.6 cm gallstone with possible tiny additional stones. Gallbladder wall thickening and sonographic Murphy's sign. 2. No evidence  of biliary obstruction.   Electronically Signed   By: Genevie Ann M.D.   On: 03/16/2015 02:56    Review of Systems  Constitutional: Negative for fever and chills.  Respiratory: Positive for cough. Negative for hemoptysis, sputum production and shortness of breath.   Cardiovascular: Negative for chest  pain, palpitations and leg swelling.  Gastrointestinal: Positive for abdominal pain. Negative for heartburn, nausea, vomiting, diarrhea and constipation.  Genitourinary: Negative.    Blood pressure 154/53, pulse 44, temperature 97.6 F (36.4 C), temperature source Oral, resp. rate 13, height 5' 4.8" (1.646 m), weight 64.864 kg (143 lb), SpO2 99 %. Physical Exam General: Alert, well-developed Elderly Caucasian female, in no distress Skin: Warm and dry without rash or infection. HEENT: No palpable masses or thyromegaly. Sclera nonicteric. Pupils equal round and reactive.  Lymph nodes: No cervical, supraclavicular, or inguinal nodes palpable. Lungs: Breath sounds clear and equal without increased work of breathing Cardiovascular: Regular rate and Irregular rhythm with 2/6 systolic murmur. No JVD or edema.  Abdomen: Nondistended. Mild to moderate localized right upper quadrant tenderness without guarding.. No masses palpable. No organomegaly. No palpable hernias. Extremities: No edema or joint swelling or deformity. No chronic venous stasis changes. Neurologic: Alert and fully oriented.    Assessment/Plan: 80 year old female with some comorbidities including previous mitral valve surgery, chronic atrial fibrillation on Coumadin and recent episode of acute bronchitis. Admitted due to acute left flank pain which I believe was secondary to a passed ureteral stone. Findings also suspicious for cholecystitis. She has been having some intermittent right upper quadrant discomfort which possibly could be due to her gallbladder but is not classic. I do not believe she has acute cholecystitis. HIDA as ordered which should help sort this out. She does however have some mild right upper quadrant tenderness and thickening of her gallbladder wall and I suspect has some degree of worsening chronic cholecystitis. This was all discussed with the patient. Overall I think he would likely be best for her to undergo  laparoscopic cholecystectomy as I think she is beginning to have worsening gallbladder disease. She is recovering from acute bronchitis that seems to be significantly better but would want advice from the medical team whether is currently advisable to proceed with general anesthesia. If her HIDA is positive she certainly will need urgent surgery. Could proceed with cholecystectomy this hospitalization if stable from pulmonary and cardiac issues. Coumadin currently being held.  Eldene Plocher T 03/16/2015, 11:25 AM

## 2015-03-16 NOTE — ED Notes (Signed)
Dr. Wofford at bedside 

## 2015-03-16 NOTE — ED Notes (Signed)
Pt placed on 2L [place]

## 2015-03-16 NOTE — Progress Notes (Signed)
ANTICOAGULATION CONSULT NOTE - Initial Consult  Pharmacy Consult for heparin Indication: atrial fibrillation  Allergies  Allergen Reactions  . Ace Inhibitors Other (See Comments)    unknown  . Aldactone [Spironolactone]     dyspnea  . Benazepril Hcl     cough  . Ciprocin-Fluocin-Procin [Fluocinolone Acetonide] Other (See Comments)    unknown  . Ciprofloxacin Diarrhea  . Codeine Nausea Only  . Diltiazem     ha  . Flagyl [Metronidazole Hcl] Other (See Comments)    headache  . Flovent [Fluticasone Propionate]     Leg cramps  . Gabapentin Swelling  . Lyrica [Pregabalin] Swelling  . Nitrofurantoin Monohyd Macro     nausea  . Quinidine     Fever diarrhea  . Simvastatin     Leg pain   . Tramadol Nausea Only  . Verapamil     myalgias  . Amoxicillin Palpitations    tachy    Patient Measurements: Height: 5\' 4"  (162.6 cm) Weight: 144 lb (65.318 kg) IBW/kg (Calculated) : 54.7  Vital Signs: Temp: 97.9 F (36.6 C) (06/18 2143) Temp Source: Oral (06/18 2143) BP: 132/46 mmHg (06/19 0515) Pulse Rate: 139 (06/19 0515)  Labs:  Recent Labs  03/13/15 0555 03/14/15 0314 03/15/15 2151 03/15/15 2355  HGB  --   --  14.7  --   HCT  --   --  42.5  --   PLT  --   --  248  --   LABPROT 34.9* 34.3*  --  23.5*  INR 3.57* 3.50*  --  2.11*  CREATININE 0.76 0.84 1.21*  --     Estimated Creatinine Clearance: 33.6 mL/min (by C-G formula based on Cr of 1.21).   Medical History: Past Medical History  Diagnosis Date  . Hypertension   . Atrial fibrillation, chronic   . Heart murmur   . Arthritis   . UTI (urinary tract infection)   . Chronic kidney disease   . Valvular heart disease     Has mitral stenosis with prior mitral commissurotomy in 1970  . H/O: rheumatic fever   . Chronic anticoagulation      Assessment: 80yo female discharged 6/17 after stay for acute respiratory failure, now c/o pain similar to prior kidney stone, CT/US reveal 2.6cm gallstone, plan for  surgical intervention, to transition from Coumadin to heparin; current INR 2.11 (was 3.5 yesterday), plan for vit K 5mg  IV now to reverse.  Goal of Therapy:  Heparin level 0.3-0.7 units/ml Monitor platelets by anticoagulation protocol: Yes   Plan:  Will recheck INR this evening (and daily if needed) and begin heparin when INR <2.  Wynona Neat, PharmD, BCPS  03/16/2015,5:41 AM

## 2015-03-16 NOTE — Progress Notes (Signed)
Utilization Review completed. Amariah Kierstead RN BSN CM 

## 2015-03-16 NOTE — H&P (Signed)
[facility] Admission History and Physical    Catherine [name]     OEV:[id number] DOB: Jul 02, 1935 DOA: 03/15/2015  Referring physician: EDP PCP: [name], MD  Specialists:   Chief Complaint: ABD and Flank Pain   HPI: Catherine [name] is a 80 y.o. female with a history of COPD, Chronic Atrial Fibrillation on Coumadin Rx, Rheumatic Valvular Heart Disease, Nephrolithiasis who presents to the ED with complaints of severe 8/10 Left sided Flank pain  that started in the afternoon.    She felt that she probably had another kidney stone.   She was evaluated in the ED and an Ultrasound of the ABD and a Renal Study Ct scan were performed and found a Non-Obstructing Renal stone, and incidentally found acute cholecystitis and the study elicited a positive Murphy's sign.   When she was told about the results she reports that she has had pain off and on for months in her RUQ ABD that radiates into her back.   She also reports having increased pain after eating along with postprandial nausea.   She denies any fevers or chills.   The EDP discussed her case with General Surgery on-call and Dr Grandville Silos is to see the patient this AM.  Vitamin K has been ordered to reverse her Coumadin, and a Heparin drip has been ordered to start when her INR < 2.0 per pharmacy.   She has been referred for medical admission.      Review of Systems:  Constitutional: No Weight Loss, No Weight Gain, Night Sweats, Fevers, Chills, Dizziness, Light Headedness, Fatigue, or Generalized Weakness HEENT: No Headaches, Difficulty Swallowing,Tooth/Dental Problems,Sore Throat,  No Sneezing, Rhinitis, Ear Ache, Nasal Congestion, or Post Nasal Drip,  Cardio-vascular:  No Chest pain, Orthopnea, PND, Edema in Lower Extremities, Anasarca, Dizziness, Palpitations  Resp: No Dyspnea, No DOE, No Productive Cough, No Non-Productive Cough, No Hemoptysis, No Wheezing.    GI: No Heartburn, Indigestion, +Abdominal Pain, +Nausea, Vomiting,  Diarrhea, Constipation, Hematemesis, Hematochezia, Melena, Change in Bowel Habits,  Loss of Appetite  GU: No Dysuria, No Change in Color of Urine, No Urgency or Urinary Frequency, +Flank pain.  Musculoskeletal: No Joint Pain or Swelling, No Decreased Range of Motion, No Back Pain.  Neurologic: No Syncope, No Seizures, Muscle Weakness, Paresthesia, Vision Disturbance or Loss, No Diplopia, No Vertigo, No Difficulty Walking,  Skin: No Rash or Lesions. Psych: No Change in Mood or Affect, No Depression or Anxiety, No Memory loss, No Confusion, or Hallucinations   Past Medical History  Diagnosis Date  . Hypertension   . Atrial fibrillation, chronic   . Heart murmur   . Arthritis   . UTI (urinary tract infection)   . Chronic kidney disease   . Valvular heart disease     Has mitral stenosis with prior mitral commissurotomy in 1970  . H/O: rheumatic fever   . Chronic anticoagulation      Past Surgical History  Procedure Laterality Date  . Cardiac catheterization    . Back surgery      neurosurgery  . Commissurotomy  1970  . Other surgical history      hysterectomy  . Cataract surg    . Abdominal hysterectomy  1983    endometriosis      Prior to Admission medications   Medication Sig Start Date End Date Taking? Authorizing Provider  acetaminophen (TYLENOL) 500 MG tablet Take 1,000 mg by mouth every 8 (eight) hours as needed for mild pain or headache.   Yes Historical Provider,  MD  ACETYLCARNITINE HCL PO Take 1 capsule by mouth 3 (three) times daily.   Yes Historical Provider, MD  albuterol (PROVENTIL HFA;VENTOLIN HFA) 108 (90 BASE) MCG/ACT inhaler Inhale 2 puffs into the lungs every 6 (six) hours as needed for wheezing or shortness of breath. 01/15/15  Yes [name], MD  atenolol (TENORMIN) 25 MG tablet Take 12.5 mg by mouth daily.   Yes Historical Provider, MD  azithromycin (ZITHROMAX) 500 MG tablet Take 1 tablet (500 mg total) by mouth daily. X5 DAYS 03/14/15  Yes Ripudeep Krystal Eaton, MD  B Complex-C (B-COMPLEX WITH VITAMIN C) tablet Take 1 tablet by mouth daily.   Yes Historical Provider, MD  benzonatate (TESSALON) 100 MG capsule Take 1 capsule (100 mg total) by mouth 3 (three) times daily as needed for cough. 03/14/15  Yes Ripudeep Krystal Eaton, MD  budesonide-formoterol (SYMBICORT) 80-4.5 MCG/ACT inhaler Inhale 2 puffs into the lungs 2 (two) times daily. 03/14/15  Yes Ripudeep Krystal Eaton, MD  calcium-vitamin D (OSCAL WITH D) 250-125 MG-UNIT per tablet Take 1 tablet by mouth 2 (two) times daily.   Yes Historical Provider, MD  diazepam (VALIUM) 5 MG tablet Take 2.5 mg by mouth daily as needed for anxiety (for neuropathy).   Yes Historical Provider, MD  docusate sodium (COLACE) 100 MG capsule Take 1 capsule (100 mg total) by mouth 2 (two) times daily. 03/14/15  Yes Ripudeep Krystal Eaton, MD  furosemide (LASIX) 80 MG tablet Take 40 mg by mouth 2 (two) times daily.   Yes Historical Provider, MD  guaifenesin (ROBITUSSIN) 100 MG/5ML syrup Take 10 mLs (200 mg total) by mouth every 4 (four) hours as needed for congestion. 03/14/15  Yes Ripudeep Krystal Eaton, MD  Multiple Vitamin (MULTIVITAMIN) tablet Take 1 tablet by mouth daily.     Yes Historical Provider, MD  POTASSIUM CHLORIDE PO Take 5 mEq by mouth See admin instructions. Take 10 times daily. 23mEq total/day   Yes Historical Provider, MD  predniSONE (DELTASONE) 10 MG tablet Prednisone dosing: Take  Prednisone 40mg  (4 tabs) x 3 days, then taper to 30mg  (3 tabs) x 3 days, then 20mg  (2 tabs) x 3days, then 10mg  (1 tab) x 3days, then OFF.  Dispense:  30 tabs, refills: None 03/14/15  Yes Ripudeep K Rai, MD  triamterene-hydrochlorothiazide (MAXZIDE-25) 37.5-25 MG per tablet Take 1 tablet by mouth daily.   Yes Historical Provider, MD  warfarin (COUMADIN) 2 MG tablet Please see discharge instructions. HOLD 6/17, and 6/18. Take 1 mg on 6/19 evening. Follow with the Coumadin clinic on Monday. Patient taking differently: Take 1-2 mg by mouth See admin instructions.  Take 0.5 tablet (1mg ) on Wednesday. Take 1 tablet (2mg ) all other days. 03/14/15  Yes Ripudeep Krystal Eaton, MD     Allergies  Allergen Reactions  . Ace Inhibitors Other (See Comments)    unknown  . Aldactone [Spironolactone]     dyspnea  . Benazepril Hcl     cough  . Ciprocin-Fluocin-Procin [Fluocinolone Acetonide] Other (See Comments)    unknown  . Ciprofloxacin Diarrhea  . Codeine Nausea Only  . Diltiazem     ha  . Flagyl [Metronidazole Hcl] Other (See Comments)    headache  . Flovent [Fluticasone Propionate]     Leg cramps  . Gabapentin Swelling  . Lyrica [Pregabalin] Swelling  . Nitrofurantoin Monohyd Macro     nausea  . Quinidine     Fever diarrhea  . Simvastatin     Leg pain   . Tramadol  Nausea Only  . Verapamil     myalgias  . Amoxicillin Palpitations    tachy    Social History:  reports that she has quit smoking. She has never used smokeless tobacco. She reports that she does not drink alcohol or use illicit drugs.    Family History  Problem Relation Age of Onset  . Leukemia Father        Physical Exam:  GEN:  Pleasant Elderly Thin   80 y.o. Caucasian female examined and in no acute distress; cooperative with exam Filed Vitals:   03/16/15 0430 03/16/15 0445 03/16/15 0500 03/16/15 0515  BP: 134/50 129/61 132/54 132/46  Pulse: 41 42 46 139  Temp:      TempSrc:      Resp: 15 12 15 15   Height:      Weight:      SpO2: 98% 99% 98% 98%   Blood pressure 132/46, pulse 139, temperature 97.9 F (36.6 C), temperature source Oral, resp. rate 15, height 5\' 4"  (1.626 m), weight 65.318 kg (144 lb), SpO2 98 %. PSYCH: She is alert and oriented x4; does not appear anxious does not appear depressed; affect is normal HEENT: Normocephalic and Atraumatic, Mucous membranes pink; PERRLA; EOM intact; Fundi:  Benign;  No scleral icterus, Nares: Patent, Oropharynx: Clear, Fair Dentition,    Neck:  FROM, No Cervical Lymphadenopathy nor Thyromegaly or Carotid Bruit; No  JVD; Breasts:: Not examined CHEST WALL: No tenderness CHEST: Normal respiration, clear to auscultation bilaterally HEART: Irregular and bradycardic rate and rhythm; no murmurs rubs or gallops BACK: No kyphosis or scoliosis; No CVA tenderness ABDOMEN: Positive Bowel Sounds, + RUQ ABD Pain on Palpation (+Murphy's Sign)Soft Non-Tender, No Rebound or Guarding; No Masses, No Organomegaly. Rectal Exam: Not done EXTREMITIES: No Cyanosis, Clubbing, or Edema; No Ulcerations. Genitalia: not examined PULSES: 2+ and symmetric SKIN: Normal hydration no rash or ulceration CNS:  Alert and Oriented x 4, No Focal Deficits Vascular: pulses palpable throughout    Labs on Admission:  Basic Metabolic Panel:  Recent Labs Lab 03/11/15 0853 03/11/15 1715 03/12/15 0251 03/13/15 0555 03/14/15 0314 03/15/15 2151  NA 141  --  139 140 142 138  K 3.0*  --  4.6 4.4 3.7 4.1  CL 100*  --  105 101 104 100*  CO2 30  --  25 27 29 28   GLUCOSE 136*  --  144* 136* 128* 130*  BUN 11  --  12 19 23* 28*  CREATININE 0.82  --  0.69 0.76 0.84 1.21*  CALCIUM 8.8*  --  9.3 8.9 8.9 8.8*  MG  --  2.0  --   --   --   --    Liver Function Tests:  Recent Labs Lab 03/15/15 2151  AST 62*  ALT 63*  ALKPHOS 62  BILITOT 1.0  PROT 6.8  ALBUMIN 3.8   No results for input(s): LIPASE, AMYLASE in the last 168 hours. No results for input(s): AMMONIA in the last 168 hours. CBC:  Recent Labs Lab 03/11/15 0853 03/12/15 0251 03/15/15 2151  WBC 7.6 11.2* 14.1*  NEUTROABS 5.7  --  11.7*  HGB 13.2 13.4 14.7  HCT 38.9 40.0 42.5  MCV 89.8 90.7 89.3  PLT 156 162 248   Cardiac Enzymes:  Recent Labs Lab 03/11/15 0853 03/11/15 1345  TROPONINI <0.03 <0.03    BNP (last 3 results) No results for input(s): BNP in the last 8760 hours.  ProBNP (last 3 results) No results for input(s): PROBNP in  the last 8760 hours.  CBG: No results for input(s): GLUCAP in the last 168 hours.  Radiological Exams on Admission: Ct  Renal Stone Study  03/16/2015   CLINICAL DATA:  80 year old female with left flank pain radiating to the abdomen with Dysuria and chills. Initial encounter.  EXAM: CT ABDOMEN AND PELVIS WITHOUT CONTRAST  TECHNIQUE: Multidetector CT imaging of the abdomen and pelvis was performed following the standard protocol without IV contrast.  COMPARISON:  CT Abdomen and Pelvis 02/09/2009  FINDINGS: Progressed cardiomegaly since 2010. No pericardial effusion. Mild motion artifact at the lung bases. No pleural effusion.  Postoperative changes at the lumbosacral junction are new since the prior exam. No acute osseous abnormality identified.  No pelvic free fluid. Surgically absent uterus. Redundant but otherwise negative distal colon with retained stool.  Retained stool throughout the more proximal colon. No large bowel inflammation. No dilated small bowel. Decompressed stomach and duodenum.  Bulky gallstone within the gallbladder, 2 cm diameter. Motion artifact at the level of the gallbladder fossa, no definite pericholecystic inflammation.  Negative non contrast liver, spleen, pancreas, and adrenal glands. Aortoiliac calcified atherosclerosis noted.  Negative non contrast right kidney and ureter.  Moderate to severe left perinephric stranding with nephro megaly and hydronephrosis. Left periureteral stranding. Decompressed distal left ureter, however, there is a 3 mm calculus just inside the bladder near the left UVJ (series 2, image 71). Small volume left para renal fluid. Punctate left midpole and lower pole nephrolithiasis.  IMPRESSION: 1. Sequelae of acute obstructive uropathy on the left with forniceal rupture. 3 mm calculus at the left UVJ or just inside the bladder. 2. Chronic large gallstone, no definite gallbladder inflammation. 3. Progressed cardiomegaly since 2010.   Electronically Signed   By: Genevie Ann M.D.   On: 03/16/2015 00:30   US Abdomen Limited Ruq  03/16/2015   CLINICAL DATA:  80 year old female with flank  pain and cholelithiasis. Initial encounter.  EXAM: US ABDOMEN LIMITED - RIGHT UPPER QUADRANT  COMPARISON:  CT Abdomen and Pelvis 0011 hr the same day.  FINDINGS: Gallbladder:  2.6 cm diameter gallstone with shadowing. Additional tiny gallstones versus polyps (image 21). Gallbladder wall thickening of 5 mm and positive sonographic Murphy's sign. No pericholecystic fluid identified.  Common bile duct:  Diameter: 2 mm, normal  Liver:  Mildly heterogeneous echotexture. No discrete liver lesion or intrahepatic biliary ductal dilatation.  IMPRESSION: 1. Positive for Acute Cholecystitis: Large 2.6 cm gallstone with possible tiny additional stones. Gallbladder wall thickening and sonographic Murphy's sign. 2. No evidence of biliary obstruction.   Electronically Signed   By: Genevie Ann M.D.   On: 03/16/2015 02:56     EKG: Independently reviewed. Atrial Fibrillation rate = 47,   S-T depression in Anterior and Lateral leads   Assessment/Plan:   80 y.o. female with   Principal Problem:   1.    Acute cholecystitis/ with Elevated Tranasminases   General Surgery Consulted to see this AM   NPO   IV Protonix   IV Metronidazole   Reverse Coumadin   Pain Control with IV Dilaudid PRN   Active Problems:   2.   Nephrolithiasis   Non-Obstructing on Renal Studies     3.   Warfarin-induced coagulopathy   Reverse Coumadin    Vitamin K 5 mg IV x1 ordered    Hold Coumadin   Monitor PT/INR Recheck at 1300    Initiate IV Heparin Drip when INR < 2.0     4.   Atrial  fibrillation, chronic   On Atenolol but holding since NPO and Bradycardic   IV Heparin to start when INR < 2.0     5.   Valvular heart disease- due to Rheumatic Heart Disease   Hx        6.   COPD (chronic obstructive pulmonary disease)   Duonebs      7.   Essential hypertension   Monitor BPs   Oral Anti-Hypertensives on Hold while NPO       8.   DVT Prophylaxis   Initiate IV Heparin when INR < 2.0       Code Status:     FULL CODE        Family Communication:   Family at Bedside    Disposition Plan:    Inpatient  Status        Time spent: [place] [facility] Pager [phone]   If [place] Please Contact the Day Rounding Team MD for [facility]  If 7PM-7AM, Please Contact Night-Floor Coverage  [email or web address] Password [email or web address] 03/16/2015, 5:46 AM     ADDENDUM:   Patient was seen and examined on 03/16/2015

## 2015-03-16 NOTE — Progress Notes (Signed)
Received patient to room 5W24 from ED via stretcher, ambulated from stretcher to bed without problems, IVF infusing to LFA without problems, site clear, O2 in use. SR up, call bell in reach. Admission paperwork/assessment complete, patient oriented to room, call bell, bed controls, all orders, patient guide book and fall prevention with verbal understanding. ID bracelet applied after confirming patient name and birthdate. Tele box on and monitor room notified. Denies pain or problems at this time, will monitor.

## 2015-03-16 NOTE — Progress Notes (Signed)
[facility]  -80 year old female who was discharged from the hospital on 6/17 after being treated for a COPD exacerbation. She returned to the hospital on 6/18 with severe left-sided flank pain. The pain eventually moved to left anterior abdomen and then the groin and then resolved. A CT scan of the abdomen and pelvis was done which revealed a forniceal rupture and a 3 mm calculus at the left UV junction or just inside the bladder. This was consistent with her presentation and at this time she has likely passed the stone.  The CT scan above incidentally found chronic large gallstone. Further imaging with the ultrasound revealed acute cholecystitis with a 2.6 cm gallstone and possible additional stones. The patient is admitted to intermittent right upper quadrant pain but no significant issues with vomiting or diarrhea.  She feels well today without complaints On exam there is right upper quadrant tenderness which she states occurred after she had the hiatus scan performed. No CVA tenderness.  Principal Problem:   Acute cholecystitis? -HIDA scan ordered however she received fentanyl last night and therefore it cannot be performed until tomorrow - we'll place her on a low fat diet -Surgery consult appreciated-Dr. Barbie Banner were suspect she may have some chronic cholecystitis and is asking for medical clearance for surgery  Active Problems: Left sided nephrolithiasis with forniceal rupture-discussed with urology-no further management necessary as her pain has resolved indicating passage of the stone  COPD flare -We'll ask RN to ambulate and check oxygen levels-she currently appears quite stable other than having a mild cough    Essential hypertension -Atenolol and Maxzide held on admission-she is bradycardic and has acute renal failure -Place her on oral hydralazine    Atrial fibrillation, chronic -Rate controlled -actually she is bradycardic-hold Coumadin for possible surgery   Warfarin-induced coagulopathy -INR 3.57 on admission-holding Coumadin  Moderate to severe mitral regurgitation -Unchanged from prior echo in 2015  Debbe Odea, MD

## 2015-03-16 NOTE — Progress Notes (Signed)
ANTICOAGULATION CONSULT NOTE - Follow Up Consult  Pharmacy Consult for heparin Indication: atrial fibrillation  Allergies  Allergen Reactions  . Ace Inhibitors Other (See Comments)    unknown  . Aldactone [Spironolactone]     dyspnea  . Benazepril Hcl     cough  . Ciprocin-Fluocin-Procin [Fluocinolone Acetonide] Other (See Comments)    unknown  . Ciprofloxacin Diarrhea  . Codeine Nausea Only  . Diltiazem     ha  . Flagyl [Metronidazole Hcl] Other (See Comments)    headache  . Flovent [Fluticasone Propionate]     Leg cramps  . Gabapentin Swelling  . Lyrica [Pregabalin] Swelling  . Nitrofurantoin Monohyd Macro     nausea  . Quinidine     Fever diarrhea  . Simvastatin     Leg pain   . Tramadol Nausea Only  . Verapamil     myalgias  . Amoxicillin Palpitations    tachy    Patient Measurements: Height: 5' 4.8" (164.6 cm) Weight: 143 lb (64.864 kg) IBW/kg (Calculated) : 56.54  Heparin Dosing Weight: 64 kg  Vital Signs: Temp: 97.9 F (36.6 C) (06/19 1411) Temp Source: Oral (06/19 1411) BP: 117/61 mmHg (06/19 1411) Pulse Rate: 115 (06/19 1411)  Labs:  Recent Labs  03/14/15 0314 03/15/15 2151 03/15/15 2355 03/16/15 1427  HGB  --  14.7  --   --   HCT  --  42.5  --   --   PLT  --  248  --   --   LABPROT 34.3*  --  23.5* 18.0*  INR 3.50*  --  2.11* 1.48  CREATININE 0.84 1.21*  --   --     Estimated Creatinine Clearance: 34.7 mL/min (by C-G formula based on Cr of 1.21).   Medical History: Past Medical History  Diagnosis Date  . Hypertension   . Atrial fibrillation, chronic   . Heart murmur   . Arthritis   . UTI (urinary tract infection)   . Chronic kidney disease   . Valvular heart disease     Has mitral stenosis with prior mitral commissurotomy in 1970  . H/O: rheumatic fever   . Chronic anticoagulation      Assessment: 80yo female discharged 6/17 after stay for acute respiratory failure, now c/o pain similar to prior kidney stone, CT/US  reveal 2.6cm gallstone, plan for surgical intervention, to transition from Coumadin to heparin; current INR 2.11 (was 3.5 yesterday), plan for vit K 5mg  IV now to reverse.  INR is now < 2.  Will start heparin.  Goal of Therapy:  Heparin level 0.3-0.7 units/ml Monitor platelets by anticoagulation protocol: Yes   Plan:  Heparin at 900 units/hr. Heparin level in 8 hours. Heparin level and CBC daily while on heparin.  [employer], Pharm.D., BCPS Clinical Pharmacist Pager [phone] 03/16/2015 3:50 PM

## 2015-03-17 ENCOUNTER — Inpatient Hospital Stay (HOSPITAL_COMMUNITY): Payer: Medicare Other

## 2015-03-17 ENCOUNTER — Ambulatory Visit: Payer: Self-pay | Admitting: Cardiology

## 2015-03-17 DIAGNOSIS — I10 Essential (primary) hypertension: Secondary | ICD-10-CM

## 2015-03-17 LAB — BASIC METABOLIC PANEL
Anion gap: 7 (ref 5–15)
BUN: 19 mg/dL (ref 6–20)
CO2: 26 mmol/L (ref 22–32)
Calcium: 8.1 mg/dL — ABNORMAL LOW (ref 8.9–10.3)
Chloride: 105 mmol/L (ref 101–111)
Creatinine, Ser: 0.9 mg/dL (ref 0.44–1.00)
GFR calc Af Amer: >60 mL/min (ref >60)
GLUCOSE: 103 mg/dL — AB (ref 65–99)
Potassium: 3.5 mmol/L (ref 3.5–5.1)
SODIUM: 138 mmol/L (ref 135–145)

## 2015-03-17 LAB — URINE CULTURE: Culture: <10000

## 2015-03-17 LAB — CBC
HCT: 37.5 % (ref 36.0–46.0)
HEMOGLOBIN: 12.9 g/dL (ref 12.0–15.0)
MCH: 31.2 pg (ref 26.0–34.0)
MCHC: 34.4 g/dL (ref 30.0–36.0)
MCV: 90.8 fL (ref 78.0–100.0)
Platelets: 202 10*3/uL (ref 150–400)
RBC: 4.13 MIL/uL (ref 3.87–5.11)
RDW: 13.8 % (ref 11.5–15.5)
WBC: 10.7 10*3/uL — ABNORMAL HIGH (ref 4.0–10.5)

## 2015-03-17 LAB — HEPARIN LEVEL (UNFRACTIONATED)
Heparin Unfractionated: 0.58 IU/mL (ref 0.30–0.70)
Heparin Unfractionated: 1.18 IU/mL — ABNORMAL HIGH (ref 0.30–0.70)

## 2015-03-17 LAB — PROTIME-INR
INR: 1.49 (ref 0.00–1.49)
Prothrombin Time: 18.1 seconds — ABNORMAL HIGH (ref 11.6–15.2)

## 2015-03-17 MED ORDER — WARFARIN SODIUM 2 MG PO TABS: 1.0000 mg | ORAL_TABLET | ORAL | Status: DC

## 2015-03-17 MED ORDER — WARFARIN SODIUM 2 MG PO TABS: 2.0000 mg | ORAL_TABLET | ORAL | Status: DC

## 2015-03-17 MED ORDER — SINCALIDE 5 MCG IJ SOLR
0.0200 ug/kg | Freq: Once | INTRAMUSCULAR | Status: AC
  Administered 2015-03-17: 1.3 ug via INTRAVENOUS

## 2015-03-17 MED ORDER — DIAZEPAM 5 MG PO TABS: 2.5000 mg | ORAL_TABLET | Freq: Every day | ORAL | Status: DC

## 2015-03-17 MED ORDER — STERILE WATER FOR INJECTION IJ SOLN
INTRAMUSCULAR | Status: AC
  Filled 2015-03-17: qty 10

## 2015-03-17 MED ORDER — FLUCONAZOLE 100 MG PO TABS: 100.0000 mg | ORAL_TABLET | Freq: Once | ORAL | Status: DC

## 2015-03-17 MED ORDER — SINCALIDE 5 MCG IJ SOLR
INTRAMUSCULAR | Status: AC
  Administered 2015-03-17: 1.3 ug via INTRAVENOUS
  Filled 2015-03-17: qty 5

## 2015-03-17 MED ORDER — TECHNETIUM TC 99M MEBROFENIN IV KIT
5.0000 | PACK | Freq: Once | INTRAVENOUS | Status: AC | PRN
  Administered 2015-03-17: 5 via INTRAVENOUS

## 2015-03-17 MED ORDER — DIAZEPAM 5 MG PO TABS
2.5000 mg | ORAL_TABLET | Freq: Every day | ORAL | Status: DC | PRN
  Administered 2015-03-17: 2.5 mg via ORAL
  Filled 2015-03-17: qty 1

## 2015-03-17 NOTE — Progress Notes (Signed)
ANTICOAGULATION CONSULT NOTE - Follow Up Consult  Pharmacy Consult for heparin Indication: atrial fibrillation  Allergies  Allergen Reactions  . Ace Inhibitors Other (See Comments)    unknown  . Aldactone [Spironolactone]     dyspnea  . Benazepril Hcl     cough  . Ciprocin-Fluocin-Procin [Fluocinolone Acetonide] Other (See Comments)    unknown  . Ciprofloxacin Diarrhea  . Codeine Nausea Only  . Diltiazem     ha  . Flagyl [Metronidazole Hcl] Other (See Comments)    headache  . Flovent [Fluticasone Propionate]     Leg cramps  . Gabapentin Swelling  . Lyrica [Pregabalin] Swelling  . Nitrofurantoin Monohyd Macro     nausea  . Quinidine     Fever diarrhea  . Simvastatin     Leg pain   . Tramadol Nausea Only  . Verapamil     myalgias  . Amoxicillin Palpitations    tachy    Patient Measurements: Height: 5' 4.8" (164.6 cm) Weight: 143 lb (64.864 kg) IBW/kg (Calculated) : 56.54  Heparin Dosing Weight: 64 kg  Vital Signs: Temp: 97.8 F (36.6 C) (06/20 0946) Temp Source: Oral (06/20 0946) BP: 145/63 mmHg (06/20 0946) Pulse Rate: 54 (06/20 0946)  Labs:  Recent Labs  03/15/15 2151 03/15/15 2355 03/16/15 1427 03/17/15 0039 03/17/15 1120  HGB 14.7  --   --  12.9  --   HCT 42.5  --   --  37.5  --   PLT 248  --   --  202  --   LABPROT  --  23.5* 18.0* 18.1*  --   INR  --  2.11* 1.48 1.49  --   HEPARINUNFRC  --   --   --  0.58 1.18*  CREATININE 1.21*  --   --  0.90  --     Estimated Creatinine Clearance: 46.7 mL/min (by C-G formula based on Cr of 0.9).   Medical History: Past Medical History  Diagnosis Date  . Hypertension   . Atrial fibrillation, chronic   . Heart murmur   . Arthritis   . UTI (urinary tract infection)   . Chronic kidney disease   . Valvular heart disease     Has mitral stenosis with prior mitral commissurotomy in 1970  . H/O: rheumatic fever   . Chronic anticoagulation      Assessment: 80yo female discharged 6/17 after stay for  acute respiratory failure, now c/o pain similar to prior kidney stone, CT/US reveal 2.6cm gallstone, plan for surgical intervention, to transition from Coumadin to heparin; current INR 2.11 (was 3.5 yesterday), plan for vit K 5mg  IV now to reverse.  INR is now < 2.  Will start heparin. PM Heparin level = 1.18  Goal of Therapy:  Heparin level 0.3-0.7 units/ml Monitor platelets by anticoagulation protocol: Yes   Plan:  Heparin to 700 units/hr. Heparin level in 8 hours. Heparin level and CBC daily while on heparin.  Thank you Anette Guarneri, PharmD [phone]  03/17/2015 1:50 PM

## 2015-03-17 NOTE — Discharge Summary (Addendum)
Physician Discharge Summary  Catherine [name] LZJ:[id number] DOB: 1935-03-22 DOA: 03/15/2015  PCP: [name], MD  Admit date: 03/15/2015 Discharge date: 03/17/2015  Time spent: 50 minutes  Recommendations for Outpatient Follow-up:  1. F/u with surgery as outpt for cholecystectomy-we will give her a date for follow-up 2. F/u with [facility] on Thursday for medial clearance   Discharge Condition: stable Diet recommendation: low fat, low sodium  Discharge Diagnoses:  Principal Problem:   Left flank pain/  Nephrolithiasis Active Problems: Right upper quadrant pain-chronic cholecystitis   Essential hypertension   Atrial fibrillation, chronic   Warfarin-induced coagulopathy   Valvular heart disease   COPD (chronic obstructive pulmonary disease)  History of present illness:  -80 year old female who was discharged from the hospital on 6/17 after being treated for a COPD exacerbation. She returned to the hospital on 6/18 with severe left-sided flank pain. The pain eventually moved to left anterior abdomen and then the groin and then resolved. A CT scan of the abdomen and pelvis was done which revealed a forniceal rupture and a 3 mm calculus at the left UV junction or just inside the bladder. This was consistent with her presentation and at this time she has likely passed the stone. The CT scan above incidentally found chronic large gallstone. Further imaging with the ultrasound revealed acute cholecystitis with a 2.6 cm gallstone and possible additional stones. The patient is admitted to intermittent right upper quadrant pain but no significant issues with vomiting or diarrhea.  Hospital Course:  Principal Problem:  Acute cholecystitis? -As mentioned above, CT scan revealed a large gallstone and ultrasound was suspicious of acute cholecystitis -HIDA negative for acute cholecystitis therefore she does not currently have acute cholecystitis however surgery does suspect she has a chronic  cholecystitis as she does complain of having intermittent right upper quadrant pain for the past couple of months -Surgery (Dr Excell Seltzer) recommending surgery at a later date for chronic cholecystitis and is asking for medical clearance for surgery  Active Problems: Left sided nephrolithiasis with forniceal rupture-discussed with urology -no further management necessary as her pain has resolved indicating passage of the stone  COPD flare -Was admitted for this and discharged on the 17th -has not been hypoxic or wheezing during the hospital stay but continues to have a cough with yellow sputum   Essential hypertension -Atenolol and Maxzide held on admission as she was bradycardic and has acute renal failure - ARF resolved- can resume Losartan   Atrial fibrillation, chronic -she was bradycardic-Atenolol was on hold - HR was in 50-60 range- had a-fib with HR in low 100s for almost 5 min this AM- will resume Atenolol at 12.5 mg daily- cont to hold Digoxin   Warfarin-induced coagulopathy -CHADS Vasc 4 -INR 3.57 on admission - Coumadin was hold for possible surgery- INR 1.49- no surgery planned for this admission- can go back on coumadin takes 2 mg on add days except Wed when she takes 1mg - advised to go back to 2 mg daily and have INR checked in 3 days by PCP   Moderate to severe mitral regurgitation -Unchanged from prior echo in 2015   Consultations:  surgery  Discharge Exam: [facility] Weights   03/15/15 2143 03/16/15 0554  Weight: 65.318 kg (144 lb) 64.864 kg (143 lb)   Filed Vitals:   03/17/15 1413  BP: 127/65  Pulse: 63  Temp: 99.6 F (37.6 C)  Resp: 16    General: AAO x 3, no distress Cardiovascular: RRR, no murmurs  Respiratory: clear  to auscultation bilaterally GI: soft, non-tender, non-distended, bowel sound positive  Discharge Instructions You were cared for by a hospitalist during your hospital stay. If you have any questions about your discharge medications or  the care you received while you were in the hospital after you are discharged, you can call the unit and asked to speak with the hospitalist on call if the hospitalist that took care of you is not available. Once you are discharged, your primary care physician will handle any further medical issues. Please note that NO REFILLS for any discharge medications will be authorized once you are discharged, as it is imperative that you return to your primary care physician (or establish a relationship with a primary care physician if you do not have one) for your aftercare needs so that they can reassess your need for medications and monitor your lab values.      Discharge Instructions    Discharge instructions    Complete by:  As directed   Low-fat, low-salt diet Coumadin dose: Take 2mg  for 3 days- have INR check by Dr Elease Hashimoto on Thurday and follow further directions on how to take your coumadin     Increase activity slowly    Complete by:  As directed             Medication List    TAKE these medications        acetaminophen 500 MG tablet  Commonly known as:  TYLENOL  Take 1,000 mg by mouth every 8 (eight) hours as needed for mild pain or headache.     ACETYLCARNITINE HCL PO  Take 1 capsule by mouth 3 (three) times daily.     albuterol 108 (90 BASE) MCG/ACT inhaler  Commonly known as:  PROVENTIL HFA;VENTOLIN HFA  Inhale 2 puffs into the lungs every 6 (six) hours as needed for wheezing or shortness of breath.     atenolol 25 MG tablet  Commonly known as:  TENORMIN  Take 12.5 mg by mouth daily.     azithromycin 500 MG tablet  Commonly known as:  ZITHROMAX  Take 1 tablet (500 mg total) by mouth daily. X5 DAYS     B-complex with vitamin C tablet  Take 1 tablet by mouth daily.     benzonatate 100 MG capsule  Commonly known as:  TESSALON  Take 1 capsule (100 mg total) by mouth 3 (three) times daily as needed for cough.     budesonide-formoterol 80-4.5 MCG/ACT inhaler  Commonly  known as:  SYMBICORT  Inhale 2 puffs into the lungs 2 (two) times daily.     calcium-vitamin D 250-125 MG-UNIT per tablet  Commonly known as:  OSCAL WITH D  Take 1 tablet by mouth 2 (two) times daily.     diazepam 5 MG tablet  Commonly known as:  VALIUM  Take 2.5 mg by mouth daily as needed for anxiety (for neuropathy).     docusate sodium 100 MG capsule  Commonly known as:  COLACE  Take 1 capsule (100 mg total) by mouth 2 (two) times daily.     furosemide 80 MG tablet  Commonly known as:  LASIX  Take 40 mg by mouth 2 (two) times daily.     guaifenesin 100 MG/5ML syrup  Commonly known as:  ROBITUSSIN  Take 10 mLs (200 mg total) by mouth every 4 (four) hours as needed for congestion.     multivitamin tablet  Take 1 tablet by mouth daily.     POTASSIUM CHLORIDE PO  Take 5 mEq by mouth See admin instructions. Take 10 times daily. 26mEq total/day     predniSONE 10 MG tablet  Commonly known as:  DELTASONE  Prednisone dosing: Take  Prednisone 40mg  (4 tabs) x 3 days, then taper to 30mg  (3 tabs) x 3 days, then 20mg  (2 tabs) x 3days, then 10mg  (1 tab) x 3days, then OFF.  Dispense:  30 tabs, refills: None     triamterene-hydrochlorothiazide 37.5-25 MG per tablet  Commonly known as:  MAXZIDE-25  Take 1 tablet by mouth daily.     warfarin 2 MG tablet  Commonly known as:  COUMADIN  Take 1 tablet (2 mg total) by mouth See admin instructions. Take 2mg  for 3 days- have INR check by Dr Elease Hashimoto on Thurday and follow further directions on how to take your coumadin       Allergies  Allergen Reactions  . Ace Inhibitors Other (See Comments)    unknown  . Aldactone [Spironolactone]     dyspnea  . Benazepril Hcl     cough  . Ciprocin-Fluocin-Procin [Fluocinolone Acetonide] Other (See Comments)    unknown  . Ciprofloxacin Diarrhea  . Codeine Nausea Only  . Diltiazem     ha  . Flagyl [Metronidazole Hcl] Other (See Comments)    headache  . Flovent [Fluticasone Propionate]     Leg  cramps  . Gabapentin Swelling  . Lyrica [Pregabalin] Swelling  . Nitrofurantoin Monohyd Macro     nausea  . Quinidine     Fever diarrhea  . Simvastatin     Leg pain   . Tramadol Nausea Only  . Verapamil     myalgias  . Amoxicillin Palpitations    tachy   Follow-up Information    Follow up with HOXWORTH,BENJAMIN T, MD. Schedule an appointment as soon as possible for a visit in 3 weeks.   Specialty:  General Surgery   Why:  For post-hospital follow up, call to check on appointment date/time ASAP   Contact information:   [place] [address] [phone]       Follow up with [name], MD.   Specialty:  Family Medicine   Why:  see on Thursday    Contact information:   [address] [place] [address] [phone]        The results of significant diagnostics from this hospitalization (including imaging, microbiology, ancillary and laboratory) are listed below for reference.    Significant Diagnostic Studies: Nm Hepato W/eject Fract  03/17/2015   CLINICAL DATA:  Episodic right upper quadrant abdominal pain for 3 months. Nausea. Known gallstones.  EXAM: NUCLEAR MEDICINE HEPATOBILIARY IMAGING WITH GALLBLADDER EF  TECHNIQUE: Sequential images of the abdomen were obtained out to 60 minutes following intravenous administration of radiopharmaceutical. After slow intravenous infusion of 1.3 micrograms Cholecystokinin, gallbladder ejection fraction was determined.  RADIOPHARMACEUTICALS:  5.0 mCi Technetium-51m Choletec IV  COMPARISON:  Abdominal ultrasound 03/16/2015  FINDINGS: There is prompt radiotracer uptake by the liver with excretion into the biliary system. Gallbladder activity is initially observed at approximately 15 min with progressive accumulation. Small bowel activity is identified at 25 min.  During administration of CCK, there is expulsion of radiotracer from the gallbladder with a calculated ejection fraction of 82% at 40 min.  At 45 min, normal ejection fraction is greater than 40%. The patient did not experience pain during the CCK infusion.  IMPRESSION: Unremarkable hepatobiliary scan. No evidence of cystic duct obstruction.   Electronically Signed  By: Logan Bores   On: 03/17/2015 12:09   Dg Chest Port 1 View  03/11/2015   CLINICAL DATA:  Shortness of breath.  Wheezing.  Chronic bronchitis.  EXAM: PORTABLE CHEST - 1 VIEW  COMPARISON:  Radiographs dated 12/08/2013 and 11/17/2012 and chest CT dated 07/30/2007  FINDINGS: Heart size and pulmonary vascularity are normal. There is peribronchial thickening. Chronic elevation of the lateral aspect of the left hemidiaphragm.  No infiltrates or effusions.  IMPRESSION: Bronchitic changes.   Electronically Signed   By: Lorriane Shire M.D.   On: 03/11/2015 09:03   Mm Digital Screening Bilateral  03/10/2015   CLINICAL DATA:  Screening.  EXAM: DIGITAL SCREENING BILATERAL MAMMOGRAM WITH CAD  COMPARISON:  Previous exam(s).  ACR Breast Density Category b: There are scattered areas of fibroglandular density.  FINDINGS: There are no findings suspicious for malignancy. Images were processed with CAD.  IMPRESSION: No mammographic evidence of malignancy. A result letter of this screening mammogram will be mailed directly to the patient.  RECOMMENDATION: Screening mammogram in one year. (Code:SM-B-01Y)  BI-RADS CATEGORY  1: Negative.   Electronically Signed   By: Pamelia Hoit M.D.   On: 03/10/2015 14:55   Ct Renal Stone Study  03/16/2015   CLINICAL DATA:  80 year old female with left flank pain radiating to the abdomen with Dysuria and chills. Initial encounter.  EXAM: CT ABDOMEN AND PELVIS WITHOUT CONTRAST  TECHNIQUE: Multidetector CT imaging of the abdomen and pelvis was performed following the standard protocol without IV contrast.  COMPARISON:  CT Abdomen and Pelvis 02/09/2009  FINDINGS: Progressed cardiomegaly since 2010. No pericardial effusion. Mild motion artifact at the lung bases. No  pleural effusion.  Postoperative changes at the lumbosacral junction are new since the prior exam. No acute osseous abnormality identified.  No pelvic free fluid. Surgically absent uterus. Redundant but otherwise negative distal colon with retained stool.  Retained stool throughout the more proximal colon. No large bowel inflammation. No dilated small bowel. Decompressed stomach and duodenum.  Bulky gallstone within the gallbladder, 2 cm diameter. Motion artifact at the level of the gallbladder fossa, no definite pericholecystic inflammation.  Negative non contrast liver, spleen, pancreas, and adrenal glands. Aortoiliac calcified atherosclerosis noted.  Negative non contrast right kidney and ureter.  Moderate to severe left perinephric stranding with nephro megaly and hydronephrosis. Left periureteral stranding. Decompressed distal left ureter, however, there is a 3 mm calculus just inside the bladder near the left UVJ (series 2, image 71). Small volume left para renal fluid. Punctate left midpole and lower pole nephrolithiasis.  IMPRESSION: 1. Sequelae of acute obstructive uropathy on the left with forniceal rupture. 3 mm calculus at the left UVJ or just inside the bladder. 2. Chronic large gallstone, no definite gallbladder inflammation. 3. Progressed cardiomegaly since 2010.   Electronically Signed   By: Genevie Ann M.D.   On: 03/16/2015 00:30   US Abdomen Limited Ruq  03/16/2015   CLINICAL DATA:  80 year old female with flank pain and cholelithiasis. Initial encounter.  EXAM: US ABDOMEN LIMITED - RIGHT UPPER QUADRANT  COMPARISON:  CT Abdomen and Pelvis 0011 hr the same day.  FINDINGS: Gallbladder:  2.6 cm diameter gallstone with shadowing. Additional tiny gallstones versus polyps (image 21). Gallbladder wall thickening of 5 mm and positive sonographic Murphy's sign. No pericholecystic fluid identified.  Common bile duct:  Diameter: 2 mm, normal  Liver:  Mildly heterogeneous echotexture. No discrete liver lesion  or intrahepatic biliary ductal dilatation.  IMPRESSION: 1. Positive for Acute Cholecystitis:  Large 2.6 cm gallstone with possible tiny additional stones. Gallbladder wall thickening and sonographic Murphy's sign. 2. No evidence of biliary obstruction.   Electronically Signed   By: Genevie Ann M.D.   On: 03/16/2015 02:56    Microbiology: Recent Results (from the past 240 hour(s))  Urine culture     Status: None   Collection Time: 03/15/15  9:54 PM  Result Value Ref Range Status   Specimen Description URINE, CATHETERIZED  Final   Special Requests NONE  Final   Culture <10,000 COLONIES/mL INSIGNIFICANT GROWTH  Final   Report Status 03/17/2015 FINAL  Final     Labs: Basic Metabolic Panel:  Recent Labs Lab 03/11/15 1715 03/12/15 0251 03/13/15 0555 03/14/15 0314 03/15/15 2151 03/17/15 0039  NA  --  139 140 142 138 138  K  --  4.6 4.4 3.7 4.1 3.5  CL  --  105 101 104 100* 105  CO2  --  25 27 29 28 26   GLUCOSE  --  144* 136* 128* 130* 103*  BUN  --  12 19 23* 28* 19  CREATININE  --  0.69 0.76 0.84 1.21* 0.90  CALCIUM  --  9.3 8.9 8.9 8.8* 8.1*  MG 2.0  --   --   --   --   --    Liver Function Tests:  Recent Labs Lab 03/15/15 2151  AST 62*  ALT 63*  ALKPHOS 62  BILITOT 1.0  PROT 6.8  ALBUMIN 3.8   No results for input(s): LIPASE, AMYLASE in the last 168 hours. No results for input(s): AMMONIA in the last 168 hours. CBC:  Recent Labs Lab 03/11/15 0853 03/12/15 0251 03/15/15 2151 03/17/15 0039  WBC 7.6 11.2* 14.1* 10.7*  NEUTROABS 5.7  --  11.7*  --   HGB 13.2 13.4 14.7 12.9  HCT 38.9 40.0 42.5 37.5  MCV 89.8 90.7 89.3 90.8  PLT 156 162 248 202   Cardiac Enzymes:  Recent Labs Lab 03/11/15 0853 03/11/15 1345  TROPONINI <0.03 <0.03   BNP: BNP (last 3 results) No results for input(s): BNP in the last 8760 hours.  ProBNP (last 3 results) No results for input(s): PROBNP in the last 8760 hours.  CBG: No results for input(s): GLUCAP in the last 168  hours.     SignedDebbe Odea, MD [facility] 03/17/2015, 2:52 PM

## 2015-03-17 NOTE — Progress Notes (Signed)
Subjective: She just completed the HIDA scan.  No pain on the left, but she is tender if you push in the RUQ.  She was able to eat yesterday without any problems.  Objective: Vital signs in last 24 hours: Temp:  [97.9 F (36.6 C)-98.2 F (36.8 C)] 97.9 F (36.6 C) (06/20 0401) Pulse Rate:  [46-115] 60 (06/20 0401) Resp:  [16-18] 16 (06/20 0401) BP: (106-118)/(44-61) 113/44 mmHg (06/20 0401) SpO2:  [95 %-97 %] 96 % (06/20 0401) Last BM Date: 03/15/15 PO 200  NPO 1200 urine Afebrile, VSS Labs OK, WBC is better [name]:  Positive for Acute Cholecystitis: Large 2.6 cm gallstone with possible tiny additional stones. Gallbladder wall thickening and sonographic Murphy's sign.   No evidence of biliary obstruction. Intake/Output from previous day: 06/19 0701 - 06/20 0700 In: 1011.6 [P.O.:200; I.V.:611.6; IV Piggyback:200] Out: 1200 [Urine:1200] Intake/Output this shift:    General appearance: alert, cooperative and no distress GI: soft, tender RUQ to palpation, left flank pain resolved.  +BS.  Lab Results:   Recent Labs  03/15/15 2151 03/17/15 0039  WBC 14.1* 10.7*  HGB 14.7 12.9  HCT 42.5 37.5  PLT 248 202    BMET  Recent Labs  03/15/15 2151 03/17/15 0039  NA 138 138  K 4.1 3.5  CL 100* 105  CO2 28 26  GLUCOSE 130* 103*  BUN 28* 19  CREATININE 1.21* 0.90  CALCIUM 8.8* 8.1*   PT/INR  Recent Labs  03/16/15 1427 03/17/15 0039  LABPROT 18.0* 18.1*  INR 1.48 1.49     Recent Labs Lab 03/15/15 2151  AST 62*  ALT 63*  ALKPHOS 62  BILITOT 1.0  PROT 6.8  ALBUMIN 3.8     Lipase     Component Value Date/Time   LIPASE 21 06/02/2008 0450     Studies/Results: Ct Renal Stone Study  03/16/2015   CLINICAL DATA:  80 year old female with left flank pain radiating to the abdomen with Dysuria and chills. Initial encounter.  EXAM: CT ABDOMEN AND PELVIS WITHOUT CONTRAST  TECHNIQUE: Multidetector CT imaging of the abdomen and pelvis was performed following the  standard protocol without IV contrast.  COMPARISON:  CT Abdomen and Pelvis 02/09/2009  FINDINGS: Progressed cardiomegaly since 2010. No pericardial effusion. Mild motion artifact at the lung bases. No pleural effusion.  Postoperative changes at the lumbosacral junction are new since the prior exam. No acute osseous abnormality identified.  No pelvic free fluid. Surgically absent uterus. Redundant but otherwise negative distal colon with retained stool.  Retained stool throughout the more proximal colon. No large bowel inflammation. No dilated small bowel. Decompressed stomach and duodenum.  Bulky gallstone within the gallbladder, 2 cm diameter. Motion artifact at the level of the gallbladder fossa, no definite pericholecystic inflammation.  Negative non contrast liver, spleen, pancreas, and adrenal glands. Aortoiliac calcified atherosclerosis noted.  Negative non contrast right kidney and ureter.  Moderate to severe left perinephric stranding with nephro megaly and hydronephrosis. Left periureteral stranding. Decompressed distal left ureter, however, there is a 3 mm calculus just inside the bladder near the left UVJ (series 2, image 71). Small volume left para renal fluid. Punctate left midpole and lower pole nephrolithiasis.  IMPRESSION: 1. Sequelae of acute obstructive uropathy on the left with forniceal rupture. 3 mm calculus at the left UVJ or just inside the bladder. 2. Chronic large gallstone, no definite gallbladder inflammation. 3. Progressed cardiomegaly since 2010.   Electronically Signed   By: Genevie Ann M.D.   On: 03/16/2015  00:30   US Abdomen Limited Ruq  03/16/2015   CLINICAL DATA:  80 year old female with flank pain and cholelithiasis. Initial encounter.  EXAM: US ABDOMEN LIMITED - RIGHT UPPER QUADRANT  COMPARISON:  CT Abdomen and Pelvis 0011 hr the same day.  FINDINGS: Gallbladder:  2.6 cm diameter gallstone with shadowing. Additional tiny gallstones versus polyps (image 21). Gallbladder wall  thickening of 5 mm and positive sonographic Murphy's sign. No pericholecystic fluid identified.  Common bile duct:  Diameter: 2 mm, normal  Liver:  Mildly heterogeneous echotexture. No discrete liver lesion or intrahepatic biliary ductal dilatation.  IMPRESSION: 1. Positive for Acute Cholecystitis: Large 2.6 cm gallstone with possible tiny additional stones. Gallbladder wall thickening and sonographic Murphy's sign. 2. No evidence of biliary obstruction.   Electronically Signed   By: Genevie Ann M.D.   On: 03/16/2015 02:56    Medications: . metronidazole  500 mg Intravenous Q8H  . pantoprazole (PROTONIX) IV  40 mg Intravenous Q24H  . sodium chloride  3 mL Intravenous Q12H  . sterile water (preservative free)      . zolpidem  5 mg Oral Once   . sodium chloride 50 mL/hr at 03/17/15 0439  . heparin 900 Units/hr (03/16/15 1716)    Assessment/Plan Left flank pain with hx of nephrolithiasis Cholelithiasis with intermittent episodic pain for 3 months HIDA scan pending Hypertension Atrial fibrillation, chronic  On anticoagulation Hospitalized 6/14-17 with hypoxia and respiratory failure Moderate MR/hx of commissurotomy in the past. Antibiotics:  Flagyl Day 3 DVT:  Heparin drip/SCD   Plan:  Dr. Lear Ng opinion yesterday was that he thought she might need cholecystectomy.  HIDA scan is pending.  She would need medical clearance in order to do that.     LOS: 1 day    Semaj Kham 03/17/2015

## 2015-03-17 NOTE — Progress Notes (Signed)
NURSING PROGRESS NOTE  CHARL WELLEN [id number] Discharge Data: 03/17/2015 6:00 PM Attending Provider: Debbe Odea, MD XNA:[id number],RKYHC W, MD   [name] to be D/C'd Home per MD order with husband with hard copies of all prescriptions. All questions about follow up care in relation to gallbladder answered by MD Marlou Starks at bedside. Patient verbalized understanding that she needs to and will go to all of her follow up appointments.    All IV's will be discontinued and monitored for bleeding.  All belongings will be returned to patient for patient to take home.  Last Documented Vital Signs:  Blood pressure 127/65, pulse 63, temperature 99.6 F (37.6 C), temperature source Oral, resp. rate 16, height 5' 4.8" (1.646 m), weight 64.864 kg (143 lb), SpO2 96 %.  Hendricks Limes RN, BS, BSN

## 2015-03-17 NOTE — Progress Notes (Addendum)
Patient has been ready for d/c since order was put in by MD, currently waiting to see if MD Marlou Starks will see patient to answer some questions the patient has about her upcoming possible gall bladder surgery prior to her discharge today.

## 2015-03-17 NOTE — Progress Notes (Signed)
ANTICOAGULATION CONSULT NOTE - Follow Up Consult  Pharmacy Consult for heparin Indication: atrial fibrillation   Labs:  Recent Labs  03/14/15 0314 03/15/15 2151 03/15/15 2355 03/16/15 1427 03/17/15 0039  HGB  --  14.7  --   --  12.9  HCT  --  42.5  --   --  37.5  PLT  --  248  --   --  202  LABPROT 34.3*  --  23.5* 18.0* 18.1*  INR 3.50*  --  2.11* 1.48 1.49  HEPARINUNFRC  --   --   --   --  0.58  CREATININE 0.84 1.21*  --   --   --      Assessment/Plan:  80yo female therapeutic on heparin with initial dosing while Coumadin on hold. Will continue gtt at current rate and confirm stable with additional level.   Wynona Neat, PharmD, BCPS  03/17/2015,1:11 AM

## 2015-03-17 NOTE — Progress Notes (Signed)
Patient was just seen by MD toth she was waiting for, patient will be dc'd now. Patient comfortable and all questions answered to her liking from MD Marlou Starks at bedside.

## 2015-03-18 ENCOUNTER — Telehealth: Payer: Self-pay | Admitting: Family Medicine

## 2015-03-18 ENCOUNTER — Other Ambulatory Visit: Payer: Self-pay | Admitting: Cardiology

## 2015-03-18 ENCOUNTER — Telehealth: Payer: Self-pay | Admitting: Cardiology

## 2015-03-18 NOTE — Telephone Encounter (Signed)
Advised patient, verbalized understanding  Will discuss with Dr Erick Blinks office since they regulate her Warfarin Thursday at her follow up visit

## 2015-03-18 NOTE — Telephone Encounter (Signed)
Follow up ° ° ° ° °Returning Catherine [name]'s call °

## 2015-03-18 NOTE — Telephone Encounter (Signed)
New Message       Pt calling stating that she has been in the hospital and wants to talk to [place]. Please call back and advise.

## 2015-03-18 NOTE — Telephone Encounter (Signed)
Patient phoned to schedule appointment with  [name] for surgical clearance Will forward to  Dr Mare Ferrari to see if patient needs to be seen secondary to recent ov

## 2015-03-18 NOTE — Telephone Encounter (Signed)
We would not need to see her again since we just saw her. She will need bridging in view of her mitral valve disease.

## 2015-03-18 NOTE — Telephone Encounter (Signed)
Left message to call back  

## 2015-03-18 NOTE — Telephone Encounter (Signed)
Pt has hosp fup w/ dr Elease Hashimoto on thurs. Will be here for coum at 2pm. Pt would like you to know so you will not leave. Is that ok?

## 2015-03-20 ENCOUNTER — Encounter: Payer: Self-pay | Admitting: Family Medicine

## 2015-03-20 ENCOUNTER — Ambulatory Visit (INDEPENDENT_AMBULATORY_CARE_PROVIDER_SITE_OTHER): Payer: Medicare Other | Admitting: General Practice

## 2015-03-20 ENCOUNTER — Ambulatory Visit (INDEPENDENT_AMBULATORY_CARE_PROVIDER_SITE_OTHER): Payer: Medicare Other | Admitting: Family Medicine

## 2015-03-20 VITALS — BP 130/80 | HR 66 | Temp 98.6°F | Wt 141.0 lb

## 2015-03-20 DIAGNOSIS — I4891 Unspecified atrial fibrillation: Secondary | ICD-10-CM

## 2015-03-20 DIAGNOSIS — J441 Chronic obstructive pulmonary disease with (acute) exacerbation: Secondary | ICD-10-CM

## 2015-03-20 DIAGNOSIS — K819 Cholecystitis, unspecified: Secondary | ICD-10-CM | POA: Diagnosis not present

## 2015-03-20 LAB — POCT INR: INR: 1.1

## 2015-03-20 NOTE — Progress Notes (Signed)
Pre visit review using our clinic review tool, if applicable. No additional management support is needed unless otherwise documented below in the visit note. 

## 2015-03-20 NOTE — Patient Instructions (Signed)
Cholelithiasis °Cholelithiasis (also called gallstones) is a form of gallbladder disease in which gallstones form in your gallbladder. The gallbladder is an organ that stores bile made in the liver, which helps digest fats. Gallstones begin as small crystals and slowly grow into stones. Gallstone pain occurs when the gallbladder spasms and a gallstone is blocking the duct. Pain can also occur when a stone passes out of the duct.  °RISK FACTORS °· Being female.   °· Having multiple pregnancies. Health care providers sometimes advise removing diseased gallbladders before future pregnancies.   °· Being obese. °· Eating a diet heavy in fried foods and fat.   °· Being older than 60 years and increasing age.   °· Prolonged use of medicines containing female hormones.   °· Having diabetes mellitus.   °· Rapidly losing weight.   °· Having a family history of gallstones (heredity).   °SYMPTOMS °· Nausea.   °· Vomiting. °· Abdominal pain.   °· Yellowing of the skin (jaundice).   °· Sudden pain. It may persist from several minutes to several hours. °· Fever.   °· Tenderness to the touch.  °In some cases, when gallstones do not move into the bile duct, people have no pain or symptoms. These are called "silent" gallstones.  °TREATMENT °Silent gallstones do not need treatment. In severe cases, emergency surgery may be required. Options for treatment include: °· Surgery to remove the gallbladder. This is the most common treatment. °· Medicines. These do not always work and may take 6-12 months or more to work. °· Shock wave treatment (extracorporeal biliary lithotripsy). In this treatment an ultrasound machine sends shock waves to the gallbladder to break gallstones into smaller pieces that can pass into the intestines or be dissolved by medicine. °HOME CARE INSTRUCTIONS  °· Only take over-the-counter or prescription medicines for pain, discomfort, or fever as directed by your health care provider.   °· Follow a low-fat diet until  seen again by your health care provider. Fat causes the gallbladder to contract, which can result in pain.   °· Follow up with your health care provider as directed. Attacks are almost always recurrent and surgery is usually required for permanent treatment.   °SEEK IMMEDIATE MEDICAL CARE IF:  °· Your pain increases and is not controlled by medicines.   °· You have a fever or persistent symptoms for more than 2-3 days.   °· You have a fever and your symptoms suddenly get worse.   °· You have persistent nausea and vomiting.   °MAKE SURE YOU:  °· Understand these instructions. °· Will watch your condition. °· Will get help right away if you are not doing well or get worse. °Document Released: 09/09/2005 Document Revised: 05/16/2013 Document Reviewed: 03/07/2013 °[employer]® Patient Information ©2015 [employer]. This information is not intended to replace advice given to you by your health care provider. Make sure you discuss any questions you have with your health care provider. ° °

## 2015-03-20 NOTE — Progress Notes (Signed)
Subjective:    Patient ID: Catherine [name], female    DOB: 1935/08/21, 80 y.o.   MRN: [id number]  HPI  Patient seen for recent hospital follow-up. She initially presented on 03/11/2015 with acute respiratory failure and hypoxia. She was diagnosed with "COPD exacerbation and acute bronchitis". She was treated with broncho-dilators, IV Solu-Medrol, IV Zithromax, nebulizer with Pulmicort and eventually transition to oral prednisone. At discharge her O2 sats were high 90s on room air. Repeat echocardiogram revealed severe mitral regurgitation with EF 60-65% and normal wall motion. She was discharged and then returned one day later with severe left flank pain. CT scan revealed a 3 mm calculus left UVJ junction. She had incidental note of large gallstone. Possibly additional stones. HIDA scan negative for acute findings  Patient has chronic atrial fibrillation and had heart rate in the 50s and digitoxin discontinued. She was discharged on atenolol 12.5 mg daily. ChadsVasc score of 4. She remains on Coumadin  Outpatient follow-up with surgery scheduled for consideration of laparoscopic cholecystectomy. Cardiology reportedly has already cleared her for surgery. She has pending follow-up with pulmonary. Still coughing occasionally productive. No fevers or chills. Overall improved. She is not taking Symbicort which was prescribed in hospital consistently.  Past Medical History  Diagnosis Date  . Hypertension   . Atrial fibrillation, chronic   . Heart murmur   . Arthritis   . UTI (urinary tract infection)   . Chronic kidney disease   . Valvular heart disease     Has mitral stenosis with prior mitral commissurotomy in 1970  . H/O: rheumatic fever   . Chronic anticoagulation    Past Surgical History  Procedure Laterality Date  . Cardiac catheterization    . Back surgery      neurosurgery  . Commissurotomy  1970  . Other surgical history      hysterectomy  . Cataract surg    . Abdominal  hysterectomy  1983    endometriosis    reports that she has quit smoking. She has never used smokeless tobacco. She reports that she does not drink alcohol or use illicit drugs. family history includes Leukemia in her father. Allergies  Allergen Reactions  . Ace Inhibitors Other (See Comments)    unknown  . Aldactone [Spironolactone]     dyspnea  . Benazepril Hcl     cough  . Ciprocin-Fluocin-Procin [Fluocinolone Acetonide] Other (See Comments)    unknown  . Ciprofloxacin Diarrhea  . Codeine Nausea Only  . Diltiazem     ha  . Flagyl [Metronidazole Hcl] Other (See Comments)    headache  . Flovent [Fluticasone Propionate]     Leg cramps  . Gabapentin Swelling  . Lyrica [Pregabalin] Swelling  . Nitrofurantoin Monohyd Macro     nausea  . Quinidine     Fever diarrhea  . Simvastatin     Leg pain   . Tramadol Nausea Only  . Verapamil     myalgias  . Amoxicillin Palpitations    tachy     Review of Systems  Constitutional: Negative for fever and chills.  Respiratory: Positive for cough. Negative for shortness of breath.   Cardiovascular: Negative for chest pain, palpitations and leg swelling.  Gastrointestinal: Negative for nausea, vomiting and abdominal pain.  Genitourinary: Negative for dysuria.  Neurological: Negative for dizziness.       Objective:   Physical Exam  Constitutional: She appears well-developed and well-nourished.  HENT:  Right Ear: External ear normal.  Left Ear: External ear  normal.  Mouth/Throat: Oropharynx is clear and moist.  Neck: Neck supple.  Cardiovascular: Normal rate.   Pulmonary/Chest: Effort normal. No respiratory distress.  She has a few faint scattered wheezes. No rales  Abdominal:  Minimally tender right upper quadrant to deep palpation. No mass. No guarding  Musculoskeletal: She exhibits no edema.          Assessment & Plan:  #1 recent acute exacerbation of COPD. Improved. Patient finishing up prednisone taper. She was  prescribed Symbicort but taking inconsistently. Using albuterol as needed. Pending follow-up with pulmonary #2 chronic atrial fibrillation with chadsVasc score of 4. Coumadin was subtherapeutic earlier today with adjustments being made.  #3 gallstones with question of chronic cholecystitis. Follow-up with surgeon has been arranged. She has reportedly been cleared by cardiology for surgery. Avoid high fat foods in the meantime. Follow-up immediately for any fever or worsening pain.

## 2015-03-24 ENCOUNTER — Encounter: Payer: Self-pay | Admitting: Internal Medicine

## 2015-03-24 ENCOUNTER — Ambulatory Visit (INDEPENDENT_AMBULATORY_CARE_PROVIDER_SITE_OTHER): Payer: Medicare Other | Admitting: Internal Medicine

## 2015-03-24 VITALS — BP 122/74 | HR 65 | Ht 64.0 in | Wt 139.8 lb

## 2015-03-24 DIAGNOSIS — R0600 Dyspnea, unspecified: Secondary | ICD-10-CM | POA: Diagnosis not present

## 2015-03-24 DIAGNOSIS — J449 Chronic obstructive pulmonary disease, unspecified: Secondary | ICD-10-CM | POA: Diagnosis not present

## 2015-03-24 NOTE — Patient Instructions (Signed)
Continue symbicort 80 Take 2 puffs first thing in am and then another 2 puffs about 12 hours later indefinitely since you had an attack and proair didn't help  Only use your albuterol (proair) as a rescue medication to be used if you can't catch your breath by resting or doing a relaxed purse lip breathing pattern.  - The less you use it, the better it will work when you need it. - Ok to use up to 2 puffs  every 4 hours if you must but call for immediate appointment if use goes up over your usual need - Don't leave home without it !!  (think of it like the spare tire for your car)   You are cleared for gall bladder surgery

## 2015-03-24 NOTE — Progress Notes (Signed)
Subjective:     Patient ID: Catherine [name], female   DOB: [birth year],   MRN: [id number]  HPI   80 yowf quit smoking in 1977 at her best can do 5day/7 walk x one half mile 3.2 mph flat but not doing well spring 2016 then admitted p flare of copd with new kidney stone/GB pain    Admit date: 03/15/2015 Discharge date: 03/17/2015    Discharge Condition: stable Diet recommendation: low fat, low sodium  Discharge Diagnoses:  Principal Problem:  Left flank pain/ Nephrolithiasis  Right upper quadrant pain-chronic cholecystitis  Essential hypertension  Atrial fibrillation, chronic  Warfarin-induced coagulopathy  Valvular heart disease  COPD (chronic obstructive pulmonary disease)  History of present illness:  -80 year old female who was discharged from the hospital on 6/17 after being treated for a COPD exacerbation. She returned to the hospital on 6/18 with severe left-sided flank pain. The pain eventually moved to left anterior abdomen and then the groin and then resolved. A CT scan of the abdomen and pelvis was done which revealed a forniceal rupture and a 3 mm calculus at the left UV junction or just inside the bladder. This was consistent with her presentation and at this time she has likely passed the stone. The CT scan above incidentally found chronic large gallstone. Further imaging with the ultrasound revealed acute cholecystitis with a 2.6 cm gallstone and possible additional stones. The patient is admitted to intermittent right upper quadrant pain but no significant issues with vomiting or diarrhea.  Hospital Course:  Principal Problem:  Acute cholecystitis? -As mentioned above, CT scan revealed a large gallstone and ultrasound was suspicious of acute cholecystitis -HIDA negative for acute cholecystitis therefore she does not currently have acute cholecystitis however surgery does suspect she has a chronic cholecystitis as she does complain of having intermittent right upper  quadrant pain for the past couple of months -Surgery (Dr Excell Seltzer) recommending surgery at a later date for chronic cholecystitis and is asking for medical clearance for surgery  Active Problems: Left sided nephrolithiasis with forniceal rupture-discussed with urology -no further management necessary as her pain has resolved indicating passage of the stone  COPD flare -Was admitted for this and discharged on the 17th -has not been hypoxic or wheezing during the hospital stay but continues to have a cough with yellow sputum   Essential hypertension -Atenolol and Maxzide held on admission as she was bradycardic and has acute renal failure - ARF resolved- can resume Losartan   Atrial fibrillation, chronic -she was bradycardic-Atenolol was on hold - HR was in 50-60 range- had a-fib with HR in low 100s for almost 5 min this AM- will resume Atenolol at 12.5 mg daily- cont to hold Digoxin   Warfarin-induced coagulopathy -CHADS Vasc 4 -INR 3.57 on admission - Coumadin was hold for possible surgery- INR 1.49- no surgery planned for this admission- can go back on coumadin takes 2 mg on add days except Wed when she takes 1mg - advised to go back to 2 mg daily and have INR checked in 3 days by PCP   Moderate to severe mitral regurgitation -Unchanged from prior echo in 2015   03/24/2015 1st [facility] office visit/ [name] / pre op consultation  Chief Complaint  Patient presents with  . HFU    Breathing has improved. Her cough has improved some, but she still occ coughs up yellow sputum.   at baseline using saba twice weekly then yardwrok on Friday 03/07/15  then hackiing 6/11 and admit  03/11/15 with copd/ab  Still some am cough but otherwise back to baseline on symb 80 2bid   Not limited by breathing from desired activities    No obvious day to day or daytime variabilty or assoc   cp or chest tightness, subjective wheeze overt sinus or hb symptoms. No unusual exp hx or h/o childhood  pna/ asthma or knowledge of premature birth.  Sleeping ok without nocturnal  or early am exacerbation  of respiratory  c/o's or need for noct saba. Also denies any obvious fluctuation of symptoms with weather or environmental changes or other aggravating or alleviating factors except as outlined above   Current Medications, Allergies, Complete Past Medical History, Past Surgical History, Family History, and Social History were reviewed in [employer] record.  ROS  The following are not active complaints unless bolded sore throat, dysphagia, dental problems, itching, sneezing,  nasal congestion or excess/ purulent secretions, ear ache,   fever, chills, sweats, unintended wt loss, pleuritic or exertional cp, hemoptysis,  orthopnea pnd or leg swelling, presyncope, palpitations, abdominal pain, anorexia, nausea, vomiting, diarrhea  or change in bowel or urinary habits, change in stools or urine, dysuria,hematuria,  rash, arthralgias, visual complaints, headache, numbness weakness or ataxia or problems with walking or coordination,  change in mood/affect or memory.             Review of Systems     Objective:   Physical Exam   amb   wf nad  Wt Readings from Last 3 Encounters:  03/24/15 139 lb 12.8 oz (63.413 kg)  03/20/15 141 lb (63.957 kg)  03/16/15 143 lb (64.864 kg)    Vital signs reviewed  HEENT: nl dentition, turbinates, and orophanx. Nl external ear canals without cough reflex   NECK :  without JVD/Nodes/TM/ nl carotid upstrokes bilaterally   LUNGS: no acc muscle use, clear to A and P bilaterally without cough on insp or exp maneuvers   CV:  RRR  no s3 or murmur or increase in P2, no edema   ABD:  soft and nontender with nl excursion in the supine position. No bruits or organomegaly, bowel sounds nl  MS:  warm without deformities, calf tenderness, cyanosis or clubbing  SKIN: warm and dry without lesions    NEURO:  alert, approp, no  deficits    I personally reviewed images and agree with radiology impression as follows:  CXR:  03/11/15  Heart size and pulmonary vascularity are normal. There is peribronchial thickening. Chronic elevation of the lateral aspect of the left hemidiaphragm.  No infiltrates or effusions.          Assessment:        Outpatient Encounter Prescriptions as of 03/24/2015  Medication Sig  . acetaminophen (TYLENOL) 500 MG tablet Take 1,000 mg by mouth every 8 (eight) hours as needed for mild pain or headache.  . ACETYLCARNITINE HCL PO Take 1 capsule by mouth 3 (three) times daily.  Marland Kitchen albuterol (PROVENTIL HFA;VENTOLIN HFA) 108 (90 BASE) MCG/ACT inhaler Inhale 2 puffs into the lungs every 6 (six) hours as needed for wheezing or shortness of breath.  Marland Kitchen atenolol (TENORMIN) 25 MG tablet Take 12.5 mg by mouth daily.  . B Complex-C (B-COMPLEX WITH VITAMIN C) tablet Take 1 tablet by mouth daily.  . benzonatate (TESSALON) 100 MG capsule Take 1 capsule (100 mg total) by mouth 3 (three) times daily as needed for cough.  . budesonide-formoterol (SYMBICORT) 80-4.5 MCG/ACT inhaler Inhale 2 puffs into the lungs 2 (  two) times daily.  . calcium-vitamin D (OSCAL WITH D) 250-125 MG-UNIT per tablet Take 1 tablet by mouth 2 (two) times daily.  . diazepam (VALIUM) 5 MG tablet Take 2.5 mg by mouth daily as needed for anxiety (for neuropathy).  . furosemide (LASIX) 80 MG tablet Take 40 mg by mouth 2 (two) times daily.  . Multiple Vitamin (MULTIVITAMIN) tablet Take 1 tablet by mouth daily.    . potassium chloride (K-DUR) 10 MEQ tablet TAKE 2 TABLETS 5 TIMES A DAY  . triamterene-hydrochlorothiazide (MAXZIDE-25) 37.5-25 MG per tablet Take 1 tablet by mouth daily.  Marland Kitchen warfarin (COUMADIN) 2 MG tablet Take 1 tablet (2 mg total) by mouth See admin instructions. Take 2mg  for 3 days- have INR check by Dr Elease Hashimoto on Thurday and follow further directions on how to take your coumadin  . [DISCONTINUED] azithromycin (ZITHROMAX)  500 MG tablet Take 1 tablet (500 mg total) by mouth daily. X5 DAYS  . [DISCONTINUED] docusate sodium (COLACE) 100 MG capsule Take 1 capsule (100 mg total) by mouth 2 (two) times daily. (Patient not taking: Reported on 03/24/2015)  . [DISCONTINUED] POTASSIUM CHLORIDE PO Take 5 mEq by mouth See admin instructions. Take 10 times daily. 57mEq total/day  . [DISCONTINUED] predniSONE (DELTASONE) 10 MG tablet Prednisone dosing: Take  Prednisone 40mg  (4 tabs) x 3 days, then taper to 30mg  (3 tabs) x 3 days, then 20mg  (2 tabs) x 3days, then 10mg  (1 tab) x 3days, then OFF.  Dispense:  30 tabs, refills: None   No facility-administered encounter medications on file as of 03/24/2015.

## 2015-03-26 ENCOUNTER — Encounter: Payer: Self-pay | Admitting: Internal Medicine

## 2015-03-26 ENCOUNTER — Ambulatory Visit (INDEPENDENT_AMBULATORY_CARE_PROVIDER_SITE_OTHER): Payer: Medicare Other | Admitting: General Practice

## 2015-03-26 DIAGNOSIS — I4891 Unspecified atrial fibrillation: Secondary | ICD-10-CM | POA: Diagnosis not present

## 2015-03-26 DIAGNOSIS — R0609 Other forms of dyspnea: Secondary | ICD-10-CM | POA: Insufficient documentation

## 2015-03-26 DIAGNOSIS — R0600 Dyspnea, unspecified: Secondary | ICD-10-CM | POA: Insufficient documentation

## 2015-03-26 LAB — POCT INR: INR: 2.2

## 2015-03-26 NOTE — Assessment & Plan Note (Signed)
Back to excellent baseline ex tolerance at this point

## 2015-03-26 NOTE — Progress Notes (Signed)
Pre visit review using our clinic review tool, if applicable. No additional management support is needed unless otherwise documented below in the visit note. 

## 2015-03-26 NOTE — Assessment & Plan Note (Addendum)
-   Quit smoking 1977 - Spirometry 03/24/15  FEV1  1.41 (70%) ratio 65  - 03/26/2015  Walked RA x 3 laps @ 185 ft each stopped due to  End of study, brisk pace, no sob or desat   The proper method of use, as well as anticipated side effects, of a metered-dose inhaler are discussed and demonstrated to the patient. Improved effectiveness after extensive coaching during this visit to a level of approximately  75-90%   I had an extended discussion with the patient reviewing all relevant studies completed to date x 35 m  1) although she's only a GOLD II, there is still a sign risk of GB surgery due to location and interference with good cough mechanics with risk of post op resp failure from HCAP/ atx/ poor abd compliance but clearly the risk is not prohibitive here  2) reviewed need for IS/ early mobilization and need to limit narcs  3) she is cleared for surgery   4) if flares again really should be on the symbicort 160  strength which is approved for both asthma and copd   5) Each maintenance medication was reviewed in detail including most importantly the difference between maintenance and prns and under what circumstances the prns are to be triggered using an action plan format that is not reflected in the computer generated alphabetically organized AVS.    Please see instructions for details which were reviewed in writing and the patient given a copy highlighting the part that I personally wrote and discussed at today's ov.

## 2015-03-31 NOTE — Progress Notes (Signed)
I have reviewed and agree with the plan. 

## 2015-04-02 ENCOUNTER — Ambulatory Visit: Payer: Self-pay | Admitting: Family Medicine

## 2015-04-11 ENCOUNTER — Other Ambulatory Visit: Payer: Self-pay | Admitting: General Surgery

## 2015-04-11 DIAGNOSIS — K8012 Calculus of gallbladder with acute and chronic cholecystitis without obstruction: Secondary | ICD-10-CM | POA: Diagnosis not present

## 2015-04-15 ENCOUNTER — Telehealth: Payer: Self-pay | Admitting: General Practice

## 2015-04-15 NOTE — Telephone Encounter (Signed)
-----   Message from [name], MD sent at 04/15/2015  8:28 AM EDT ----- Regarding: RE: Lovenox bridge Yes.  I  Think that would be appropriate.  1mg /kg Sidney q 12 hours  Thanks!  Do I need to put order in? ----- Message -----    From: Warden Fillers, RN    Sent: 04/14/2015  11:49 AM      To: [name], MD Subject: Lovenox bridge                                 Patient needs to be scheduled for cholescystectomy and will need to stop coumadin for 5 days.  Do you want her bridged with Lovenox?  Thanks, Villa Herb, RN

## 2015-04-18 ENCOUNTER — Other Ambulatory Visit: Payer: Self-pay | Admitting: Family Medicine

## 2015-04-21 ENCOUNTER — Other Ambulatory Visit: Payer: Self-pay | Admitting: General Practice

## 2015-04-21 ENCOUNTER — Ambulatory Visit: Payer: Self-pay

## 2015-04-21 ENCOUNTER — Ambulatory Visit (INDEPENDENT_AMBULATORY_CARE_PROVIDER_SITE_OTHER): Payer: Medicare Other | Admitting: General Practice

## 2015-04-21 DIAGNOSIS — Z5181 Encounter for therapeutic drug level monitoring: Secondary | ICD-10-CM | POA: Diagnosis not present

## 2015-04-21 DIAGNOSIS — I4891 Unspecified atrial fibrillation: Secondary | ICD-10-CM

## 2015-04-21 DIAGNOSIS — Z7901 Long term (current) use of anticoagulants: Secondary | ICD-10-CM | POA: Diagnosis not present

## 2015-04-21 LAB — POCT INR: INR: 1.6

## 2015-04-21 MED ORDER — ENOXAPARIN SODIUM 60 MG/0.6ML ~~LOC~~ SOLN: 60.0000 mg | Freq: Two times a day (BID) | SUBCUTANEOUS | Status: DC

## 2015-04-21 MED ORDER — WARFARIN SODIUM 2 MG PO TABS: ORAL_TABLET | ORAL | Status: DC

## 2015-04-21 NOTE — Progress Notes (Signed)
Agree with plan 

## 2015-04-21 NOTE — Progress Notes (Signed)
Pre visit review using our clinic review tool, if applicable. No additional management support is needed unless otherwise documented below in the visit note. 

## 2015-04-21 NOTE — Patient Instructions (Signed)
8/28 - Last dose of coumadin until after surgery 8/29 - Nothing (NO coumadin and NO Lovenox) 8/30 - Lovenox in the AM and PM (12 hours apart) 8/31 - Lovenox in the AM and PM 9/1 - Lovenox in the AM only 9/2 - PROCEDURE (NO LOVENOX)

## 2015-05-02 ENCOUNTER — Other Ambulatory Visit: Payer: Self-pay | Admitting: Cardiology

## 2015-05-14 ENCOUNTER — Other Ambulatory Visit: Payer: Self-pay | Admitting: Cardiology

## 2015-05-20 NOTE — Pre-Procedure Instructions (Signed)
Catherine [name]  05/20/2015      [facility] - [place], [place] [address] Phone: [phone] Fax: [place] [phone] - [place],  - [name] [place] [name] [place] [address] Phone: [phone] Fax: [phone]    Your procedure is scheduled on September 2nd, Friday   Report to [facility] Admitting at 5:30 AM  Call this number if you have problems the morning of surgery:  [phone]   Remember:  Do not eat food or drink liquids after midnight Thursday.  Take these medicines the morning of surgery with A SIP OF WATER : Atenolol, Valium (if needed) Please STOP taking any herbal medications & supplements, anti-inflammatories 4-5 days prior to surgery.    Do not wear jewelry, make-up or nail polish.  Do not wear lotions, powders, or perfumes.  You may NOT wear deodorant the day of surgery.  Do not shave 48 hours prior to surgery.     Do not bring valuables to the hospital.  [facility] is not responsible for any belongings or valuables.  Contacts, dentures or bridgework may not be worn into surgery.  Leave your suitcase in the car.  After surgery it may be brought to your room. For patients admitted to the hospital, discharge time will be determined by your treatment team.  Patients discharged the day of surgery will not be allowed to drive home.   Name and phone number of your driver:    Special instructions:  "Preparing for Surgery" instruction sheet.  Please read over the following fact sheets that you were given. Pain Booklet, Coughing and Deep Breathing and Surgical Site Infection Prevention

## 2015-05-21 ENCOUNTER — Encounter (HOSPITAL_COMMUNITY): Payer: Self-pay

## 2015-05-21 ENCOUNTER — Encounter (HOSPITAL_COMMUNITY)
Admission: RE | Admit: 2015-05-21 | Discharge: 2015-05-21 | Disposition: A | Discharge status: Home / Self Care | Payer: Medicare Other | Source: Ambulatory Visit | Attending: General Surgery | Admitting: General Surgery

## 2015-05-21 DIAGNOSIS — J45909 Unspecified asthma, uncomplicated: Secondary | ICD-10-CM | POA: Insufficient documentation

## 2015-05-21 DIAGNOSIS — Z87891 Personal history of nicotine dependence: Secondary | ICD-10-CM | POA: Diagnosis not present

## 2015-05-21 DIAGNOSIS — Z7901 Long term (current) use of anticoagulants: Secondary | ICD-10-CM | POA: Insufficient documentation

## 2015-05-21 DIAGNOSIS — Z79899 Other long term (current) drug therapy: Secondary | ICD-10-CM | POA: Diagnosis not present

## 2015-05-21 DIAGNOSIS — I482 Chronic atrial fibrillation: Secondary | ICD-10-CM | POA: Diagnosis not present

## 2015-05-21 DIAGNOSIS — Z01818 Encounter for other preprocedural examination: Secondary | ICD-10-CM | POA: Insufficient documentation

## 2015-05-21 DIAGNOSIS — Z01812 Encounter for preprocedural laboratory examination: Secondary | ICD-10-CM | POA: Insufficient documentation

## 2015-05-21 DIAGNOSIS — J449 Chronic obstructive pulmonary disease, unspecified: Secondary | ICD-10-CM | POA: Insufficient documentation

## 2015-05-21 HISTORY — DX: Nausea with vomiting, unspecified: R11.2

## 2015-05-21 HISTORY — DX: Other specified postprocedural states: Z98.890

## 2015-05-21 HISTORY — DX: Unspecified asthma, uncomplicated: J45.909

## 2015-05-21 LAB — CBC
HCT: 41 % (ref 36.0–46.0)
Hemoglobin: 14.1 g/dL (ref 12.0–15.0)
MCH: 31.2 pg (ref 26.0–34.0)
MCHC: 34.4 g/dL (ref 30.0–36.0)
MCV: 90.7 fL (ref 78.0–100.0)
PLATELETS: 202 10*3/uL (ref 150–400)
RBC: 4.52 MIL/uL (ref 3.87–5.11)
RDW: 13 % (ref 11.5–15.5)
WBC: 7 10*3/uL (ref 4.0–10.5)

## 2015-05-21 LAB — BASIC METABOLIC PANEL
Anion gap: 9 (ref 5–15)
BUN: 13 mg/dL (ref 6–20)
CALCIUM: 9.6 mg/dL (ref 8.9–10.3)
CO2: 28 mmol/L (ref 22–32)
Chloride: 103 mmol/L (ref 101–111)
Creatinine, Ser: 0.71 mg/dL (ref 0.44–1.00)
GFR calc Af Amer: >60 mL/min (ref >60)
GLUCOSE: 88 mg/dL (ref 65–99)
Potassium: 4.4 mmol/L (ref 3.5–5.1)
SODIUM: 140 mmol/L (ref 135–145)

## 2015-05-21 NOTE — Progress Notes (Addendum)
Had heart surgery (valve replaced by Drs Morene Antu) in the [id number].  See [name] - LOV 6 weeks ago. Dr. [name] is Pulmonary Dr. Elease Hashimoto is PCP. She has been instructed to take last dose of coumadin on Sunday, the 28th and will then be started on Lovenox injections. (hx of A-fib) Note inside chart. She was hospitalized back in June for a bout of bronchitis & asthma, and alittle later, kidney stone.  She told me she was alittle short of breathe at times and has stated she has been checked out by Drs. [place].  She states its not heart related and mainly d/t the asthma & bronchitis.  Have requested PT/PTT be drawn DOS.

## 2015-05-22 NOTE — Progress Notes (Signed)
Anesthesia Chart Review: Patient is a 80 year old female scheduled for laparoscopic cholecystectomy on 05/30/15 by Dr. Excell Seltzer.  History includes former smoker, rheumatic valvular disease s/p mitral commissurotomy for mitral stenosis '70's with moderate to severe MR, chronic afib, COPD Gold II (hospitalized for exacerbation 02/2015), post-operative N/V, asthma, arthritis, nephrolithiasis (left nephrolithiasis with forniceal rupture), back surgery, hysterectomy. Hospitalization for acute on chronic cholecystitis 02/2015.   PCP is Dr. Elease Hashimoto who recommended Lovenox bridge while off warfarin for surgery.  Pulmonologist is Dr. [name] who cleared her for surgery, but stated, "1) although she's only a GOLD II, there is still a sign risk of GB surgery due to location and interference with good cough mechanics with risk of post op resp failure from HCAP/ atx/ poor abd compliance but clearly the risk is not prohibitive here."  Cardiologist is [name] who, in 03/18/15, did not feel they would need to see her again since she was evaluated on 03/03/15. He did recommend Lovenox bridge.   Meds include albuterol, atenolol, Symbicort, Valium, Lovenox bridge while off warfarin, Lasix, KCl, Maxzide-25.  03/16/15 EKG: Afib at 47 bpm, anterior infarct (old), borderline ST depression, diffuse. No significant change 03/03/14.   03/12/15 Echo: Study Conclusions - Left ventricle: The cavity size was normal. Wall thickness was increased in a pattern of moderate LVH. Systolic function was normal. The estimated ejection fraction was in the range of 60% to 65%. Wall motion was normal; there were no regional wall motion abnormalities. - Mitral valve: Calcified annulus. Mildly thickened leaflets . There was moderate to severe regurgitation. - Left atrium: Severely dilated at 75 ml/m2. - Right atrium: The atrium was mildly dilated. - Tricuspid valve: There was moderate regurgitation. - Pulmonary arteries: PA peak  pressure: 41 mm Hg (S). - Inferior vena cava: The vessel was dilated. The respirophasic diameter changes were blunted (< 50%), consistent with elevated central venous pressure. Impressions: - Compared to the prior echo in 2015, there are few changes. The left atrium now measures 75 ml/m2. There is persistent moderate to severe mitral regurgitation.  Patient had increased SOB in 12/2011 that started after starting aldactone. Initially stress and echo were ordered. Echo done and aldactone was stopped. Her symptoms improved, and Dr Mare Ferrari decided against getting the stress test (see telephone encournter 01/11/12).    Last reported cath was in the 1970's.  03/11/15 1V CXR: IMPRESSION: Bronchitic changes.  Spirometry 03/24/15: FVC 2.18 (805), FEV1 1.41 (70%), FEV1/FVC ratio 65% (87%), FEF25-75% 0.67 (41%).  03/26/2015 Walked RA x 3 laps @ 185 ft each stopped due to End of study, brisk pace, no sob or desat  Preoperative labs noted. [name] for PT/PTT on arrival.   Pulmonologist, cardiologist, and PCP are all aware of surgery plans. Further evaluation by anesthesiologist and surgeon on the day of surgery to ensure PT/PTT are acceptable and otherwise no acute changes.  [facility]/Anesthesiology Phone [phone] 05/22/2015 5:02 PM

## 2015-05-29 NOTE — H&P (Signed)
History of Present Illness Marland Kitchen T. Lashaun Krapf MD; 04/11/2015 9:40 AM) The patient is a 80 year old female who presents for evaluation of gall stones. She returns to the office for follow-up for cholelithiasis. She was hospitalized in June with bronchitis and then return to the hospital with acute left-sided abdominal pain. CT scan indicated a passed left kidney stone but also showed cholelithiasis which was known but some significant thickening of the gallbladder wall possibly consistent with acute cholecystitis. History and physical exam at that time was of concern for mild acute cholecystitis. Subsequent ultrasound has confirmed cholelithiasis but no gallbladder wall or pericholecystic fluid and HIDA scan at that time did not show cystic duct obstruction. Decision at that time was to defer cholecystectomy until her bronchitis improved she returns to the office for discussion. On questioning she has occasional mild right upper quadrant discomfort but this seems more related to position and motion then eating. At other times she does get some mild aching discomfort in her right upper quadrant through to her back but again not related to eating. No nausea vomiting fever chills or jaundice.   Other Problems Elbert Ewings, CMA; 04/11/2015 8:49 AM) Cholelithiasis Hepatitis High blood pressure Inguinal Hernia Kidney Stone Oophorectomy Bilateral. Umbilical Hernia Repair Ventral Hernia Repair  Past Surgical History Elbert Ewings, CMA; 04/11/2015 8:49 AM) Appendectomy Colon Polyp Removal - Colonoscopy Valve Replacement  Diagnostic Studies History Elbert Ewings, CMA; 04/11/2015 8:49 AM) Colonoscopy 1-5 years ago Mammogram within last year Pap Smear 1-5 years ago  Medication History Elbert Ewings, CMA; 04/11/2015 8:52 AM) Diazepam (5MG  Tablet, Oral) Active. Fluconazole (150MG  Tablet, Oral) Active. Furosemide (80MG  Tablet, Oral) Active. Azithromycin (250MG  Tablet, Oral)  Active. PredniSONE (10MG  Tablet, Oral) Active. Warfarin Sodium (2MG  Tablet, Oral) Active. ProAir HFA (108 (90 Base)MCG/ACT Aerosol Soln, Inhalation) Active. Symbicort (80-4.5MCG/ACT Aerosol, Inhalation) Active. Calcium-Vitamin D (250-125MG -UNIT Tablet, Oral) Active. Medications Reconciled  Social History Elbert Ewings, [place]; 04/11/2015 8:49 AM) Alcohol use Remotely quit alcohol use. Caffeine use Coffee, Tea. No drug use Tobacco use Former smoker.  Family History Elbert Ewings, [place]; 04/11/2015 8:49 AM) Arthritis Mother. Colon Polyps Daughter. Heart Disease Daughter, Mother. Hypertension Mother. Respiratory Condition Daughter, Mother.  Pregnancy / Birth History Elbert Ewings, CMA; 04/11/2015 8:49 AM) Age of menopause <47 Gravida 0 Para 0  Review of Systems Elbert Ewings CMA; 04/11/2015 8:49 AM) General Present- Fatigue and Night Sweats. Not Present- Appetite Loss, Chills, Fever, Weight Gain and Weight Loss. Skin Present- Dryness. Not Present- Change in Wart/Mole, Hives, Jaundice, New Lesions, Non-Healing Wounds, Rash and Ulcer. HEENT Present- Wears glasses/contact lenses. Not Present- Earache, Hearing Loss, Hoarseness, Nose Bleed, Oral Ulcers, Ringing in the Ears, Seasonal Allergies, Sinus Pain, Sore Throat, Visual Disturbances and Yellow Eyes. Respiratory Present- Chronic Cough, Difficulty Breathing and Snoring. Not Present- Bloody sputum and Wheezing. Cardiovascular Present- Difficulty Breathing Lying Down, Leg Cramps and Shortness of Breath. Not Present- Chest Pain, Palpitations, Rapid Heart Rate and Swelling of Extremities. Gastrointestinal Present- Bloating and Hemorrhoids. Not Present- Abdominal Pain, Bloody Stool, Change in Bowel Habits, Chronic diarrhea, Constipation, Difficulty Swallowing, Excessive gas, Gets full quickly at meals, Indigestion, Nausea, Rectal Pain and Vomiting. Female Genitourinary Present- Frequency and Urgency. Not Present- Nocturia, Painful  Urination and Pelvic Pain. Musculoskeletal Present- Muscle Weakness and Swelling of Extremities. Not Present- Back Pain, Joint Pain, Joint Stiffness and Muscle Pain. Endocrine Present- Hair Changes. Not Present- Cold Intolerance, Excessive Hunger, Heat Intolerance, Hot flashes and New Diabetes. Hematology Present- Easy Bruising. Not Present- Excessive bleeding, Gland problems, HIV and Persistent  Infections.   Vitals Elbert Ewings CMA; 04/11/2015 8:53 AM) 04/11/2015 8:52 AM Weight: 146 lb Height: 64in Body Surface Area: 1.73 m Body Mass Index: 25.06 kg/m Temp.: 98.76F(Oral)  Pulse: 65 (Regular)  BP: 128/70 (Sitting, Left Arm, Standard)    Physical Exam Marland Kitchen T. Audrielle Vankuren MD; 04/11/2015 9:41 AM) The physical exam findings are as follows: Note:General: Alert, well-developed and well nourished Caucasian female, in no distress Skin: Warm and dry without rash or infection. HEENT: No palpable masses or thyromegaly. Sclera nonicteric. Pupils equal round and reactive. Oropharynx clear. Lymph nodes: No cervical, supraclavicular, or inguinal nodes palpable. Lungs: Breath sounds clear and equal. No wheezing or increased work of breathing. Cardiovascular: Regular rate and rhythm without murmer. No JVD or edema. Abdomen: Nondistended. Soft and nontender. No masses palpable. No organomegaly. No palpable hernias. Extremities: No edema or joint swelling or deformity. No chronic venous stasis changes. Neurologic: Alert and fully oriented. Gait normal. No focal weakness. Psychiatric: Normal mood and affect. Thought content appropriate with normal judgement and insight    Assessment & Plan Marland Kitchen T. Vinal Rosengrant MD; 04/11/2015 9:47 AM) CALCULUS OF GALLBLADDER WITH ACUTE ON CHRONIC CHOLECYSTITIS WITHOUT OBSTRUCTION (574.00  K80.12) Impression: Long-standing known cholelithiasis. She did have an exacerbation of right-sided abdominal pain and CT scan of concern for possible acute cholecystitis  during a recent hospitalization although subsequent HIDA and ultrasound did not show evidence of acute cholecystitis. She has some minimal symptoms now possibly secondary to her gallbladder but this is not totally clear. This was all discussed with the patient and her husband. At evaluation last month the clinical decision was to proceed with cholecystectomy. We discussed pros and cons of surgery. She would like to proceed and I think this is reasonable. I discussed the procedure in detail. The patient was given Neurosurgeon. We discussed the risks and benefits of a laparoscopic cholecystectomy and possible cholangiogram including, but not limited to, bleeding, infection, injury to surrounding structures such as the intestine or liver, bile leak, retained gallstones, need to convert to an open procedure, prolonged diarrhea, blood clots such as DVT, common bile duct injury, anesthesia risks, and possible need for additional procedures. The likelihood of improvement in symptoms and return to the patient's normal status is good. We discussed the typical post-operative recovery course. All questions were answered. She is followed at the Coumadin clinic and will need Lovenox bridging. She still has a significant cough although her lungs are clear and she has been cleared by pulmonary. We will schedule surgery in about a month and I asked her to call me if her cough is not improving and I would probably postpone her surgery until this is better. Current Plans  Pt Education - Pamphlet Given - Laparoscopic Gallbladder Surgery: discussed with patient and provided information. Schedule for Surgery Laparoscopic cholecystectomy with intraoperative cholangiogram under general anesthesia with overnight observation. Will need Lovenox bridging.

## 2015-05-30 ENCOUNTER — Ambulatory Visit (HOSPITAL_COMMUNITY): Payer: Medicare Other | Admitting: Emergency Medicine

## 2015-05-30 ENCOUNTER — Ambulatory Visit (HOSPITAL_COMMUNITY): Payer: Medicare Other | Admitting: Certified Registered"

## 2015-05-30 ENCOUNTER — Encounter (HOSPITAL_COMMUNITY)
Admission: RE | Disposition: A | Discharge status: Home / Self Care | Payer: Self-pay | Source: Ambulatory Visit | Attending: General Surgery

## 2015-05-30 ENCOUNTER — Encounter (HOSPITAL_COMMUNITY): Payer: Self-pay | Admitting: Certified Registered"

## 2015-05-30 ENCOUNTER — Ambulatory Visit (HOSPITAL_COMMUNITY): Payer: Medicare Other

## 2015-05-30 ENCOUNTER — Observation Stay (HOSPITAL_COMMUNITY)
Admission: RE | Admit: 2015-05-30 | Discharge: 2015-05-31 | Disposition: A | Discharge status: Home / Self Care | Payer: Medicare Other | Source: Ambulatory Visit | Attending: General Surgery | Admitting: General Surgery

## 2015-05-30 DIAGNOSIS — K829 Disease of gallbladder, unspecified: Secondary | ICD-10-CM

## 2015-05-30 DIAGNOSIS — M1990 Unspecified osteoarthritis, unspecified site: Secondary | ICD-10-CM | POA: Diagnosis not present

## 2015-05-30 DIAGNOSIS — Z7901 Long term (current) use of anticoagulants: Secondary | ICD-10-CM | POA: Insufficient documentation

## 2015-05-30 DIAGNOSIS — J449 Chronic obstructive pulmonary disease, unspecified: Secondary | ICD-10-CM | POA: Insufficient documentation

## 2015-05-30 DIAGNOSIS — K8010 Calculus of gallbladder with chronic cholecystitis without obstruction: Principal | ICD-10-CM | POA: Diagnosis present

## 2015-05-30 DIAGNOSIS — K8020 Calculus of gallbladder without cholecystitis without obstruction: Secondary | ICD-10-CM | POA: Diagnosis not present

## 2015-05-30 DIAGNOSIS — I10 Essential (primary) hypertension: Secondary | ICD-10-CM | POA: Diagnosis not present

## 2015-05-30 DIAGNOSIS — Z87891 Personal history of nicotine dependence: Secondary | ICD-10-CM | POA: Insufficient documentation

## 2015-05-30 DIAGNOSIS — I4891 Unspecified atrial fibrillation: Secondary | ICD-10-CM | POA: Insufficient documentation

## 2015-05-30 HISTORY — PX: CHOLECYSTECTOMY: SHX55

## 2015-05-30 LAB — PROTIME-INR
INR: 1.24 (ref 0.00–1.49)
Prothrombin Time: 15.8 seconds — ABNORMAL HIGH (ref 11.6–15.2)

## 2015-05-30 LAB — APTT: APTT: 31 s (ref 24–37)

## 2015-05-30 SURGERY — LAPAROSCOPIC CHOLECYSTECTOMY WITH INTRAOPERATIVE CHOLANGIOGRAM
Anesthesia: General | Site: Abdomen

## 2015-05-30 MED ORDER — PROPOFOL 10 MG/ML IV BOLUS
INTRAVENOUS | Status: DC | PRN
  Administered 2015-05-30 (×2): 30 mg via INTRAVENOUS
  Administered 2015-05-30: 50 mg via INTRAVENOUS

## 2015-05-30 MED ORDER — POTASSIUM CHLORIDE ER 10 MEQ PO TBCR
10.0000 meq | EXTENDED_RELEASE_TABLET | Freq: Every day | ORAL | Status: DC
  Administered 2015-05-30 – 2015-05-31 (×2): 10 meq via ORAL
  Filled 2015-05-30 (×2): qty 1

## 2015-05-30 MED ORDER — NEOSTIGMINE METHYLSULFATE 10 MG/10ML IV SOLN
INTRAVENOUS | Status: DC | PRN
Start: 2015-05-30 — End: 2015-05-30
  Administered 2015-05-30: 4 mg via INTRAVENOUS

## 2015-05-30 MED ORDER — ACETAMINOPHEN 325 MG PO TABS: 325.0000 mg | ORAL_TABLET | ORAL | Status: DC | PRN

## 2015-05-30 MED ORDER — ONDANSETRON HCL 4 MG/2ML IJ SOLN
INTRAMUSCULAR | Status: DC | PRN
  Administered 2015-05-30: 4 mg via INTRAVENOUS

## 2015-05-30 MED ORDER — WARFARIN SODIUM 2 MG PO TABS
2.0000 mg | ORAL_TABLET | Freq: Every day | ORAL | Status: DC
  Filled 2015-05-30: qty 1

## 2015-05-30 MED ORDER — GLYCOPYRROLATE 0.2 MG/ML IJ SOLN
INTRAMUSCULAR | Status: AC
  Filled 2015-05-30: qty 3

## 2015-05-30 MED ORDER — WARFARIN - PHYSICIAN DOSING INPATIENT
Freq: Every day | Status: DC
  Administered 2015-05-30: 16:00:00

## 2015-05-30 MED ORDER — FUROSEMIDE 40 MG PO TABS
40.0000 mg | ORAL_TABLET | Freq: Two times a day (BID) | ORAL | Status: DC
  Administered 2015-05-30 – 2015-05-31 (×2): 40 mg via ORAL
  Filled 2015-05-30 (×2): qty 1

## 2015-05-30 MED ORDER — DEXAMETHASONE SODIUM PHOSPHATE 4 MG/ML IJ SOLN
INTRAMUSCULAR | Status: AC
  Filled 2015-05-30: qty 2

## 2015-05-30 MED ORDER — ONDANSETRON 4 MG PO TBDP
4.0000 mg | ORAL_TABLET | Freq: Four times a day (QID) | ORAL | Status: DC | PRN
Start: 2015-05-30 — End: 2015-05-31

## 2015-05-30 MED ORDER — FENTANYL CITRATE (PF) 250 MCG/5ML IJ SOLN
INTRAMUSCULAR | Status: AC
  Filled 2015-05-30: qty 5

## 2015-05-30 MED ORDER — TRIAMTERENE-HCTZ 37.5-25 MG PO TABS
1.0000 | ORAL_TABLET | Freq: Every day | ORAL | Status: DC
  Administered 2015-05-31: 1 via ORAL
  Filled 2015-05-30: qty 1

## 2015-05-30 MED ORDER — NEOSTIGMINE METHYLSULFATE 10 MG/10ML IV SOLN
INTRAVENOUS | Status: AC
  Filled 2015-05-30: qty 1

## 2015-05-30 MED ORDER — SUCCINYLCHOLINE CHLORIDE 20 MG/ML IJ SOLN
INTRAMUSCULAR | Status: AC
  Filled 2015-05-30: qty 1

## 2015-05-30 MED ORDER — MORPHINE SULFATE (PF) 2 MG/ML IV SOLN: 2.0000 mg | INTRAVENOUS | Status: DC | PRN

## 2015-05-30 MED ORDER — DEXTROSE IN LACTATED RINGERS 5 % IV SOLN
INTRAVENOUS | Status: DC
  Administered 2015-05-30: 11:00:00 via INTRAVENOUS

## 2015-05-30 MED ORDER — BUPIVACAINE-EPINEPHRINE 0.5% -1:200000 IJ SOLN
INTRAMUSCULAR | Status: DC | PRN
  Administered 2015-05-30: 6 mL

## 2015-05-30 MED ORDER — BUPIVACAINE-EPINEPHRINE (PF) 0.5% -1:200000 IJ SOLN
INTRAMUSCULAR | Status: AC
Start: 2015-05-30 — End: 2015-05-30
  Filled 2015-05-30: qty 30

## 2015-05-30 MED ORDER — ROCURONIUM BROMIDE 100 MG/10ML IV SOLN
INTRAVENOUS | Status: DC | PRN
  Administered 2015-05-30: 35 mg via INTRAVENOUS

## 2015-05-30 MED ORDER — LIDOCAINE HCL (CARDIAC) 20 MG/ML IV SOLN
INTRAVENOUS | Status: DC | PRN
  Administered 2015-05-30: 50 mg via INTRAVENOUS

## 2015-05-30 MED ORDER — BENZONATATE 100 MG PO CAPS: 100.0000 mg | ORAL_CAPSULE | Freq: Three times a day (TID) | ORAL | Status: DC | PRN

## 2015-05-30 MED ORDER — FENTANYL CITRATE (PF) 100 MCG/2ML IJ SOLN
INTRAMUSCULAR | Status: DC | PRN
  Administered 2015-05-30: 50 ug via INTRAVENOUS
  Administered 2015-05-30: 150 ug via INTRAVENOUS
  Administered 2015-05-30: 50 ug via INTRAVENOUS

## 2015-05-30 MED ORDER — CIPROFLOXACIN IN D5W 400 MG/200ML IV SOLN
INTRAVENOUS | Status: AC
  Filled 2015-05-30: qty 200

## 2015-05-30 MED ORDER — DEXAMETHASONE SODIUM PHOSPHATE 10 MG/ML IJ SOLN
INTRAMUSCULAR | Status: DC | PRN
  Administered 2015-05-30: 8 mg via INTRAVENOUS

## 2015-05-30 MED ORDER — CALCIUM CARBONATE-VITAMIN D 250-125 MG-UNIT PO TABS: 1.0000 | ORAL_TABLET | Freq: Two times a day (BID) | ORAL | Status: DC

## 2015-05-30 MED ORDER — CHLORHEXIDINE GLUCONATE 4 % EX LIQD: 1.0000 "application " | Freq: Once | CUTANEOUS | Status: DC

## 2015-05-30 MED ORDER — ONDANSETRON HCL 4 MG/2ML IJ SOLN
4.0000 mg | Freq: Four times a day (QID) | INTRAMUSCULAR | Status: DC | PRN
  Administered 2015-05-30: 4 mg via INTRAVENOUS

## 2015-05-30 MED ORDER — DIAZEPAM 5 MG PO TABS: 2.5000 mg | ORAL_TABLET | Freq: Every day | ORAL | Status: DC | PRN

## 2015-05-30 MED ORDER — 0.9 % SODIUM CHLORIDE (POUR BTL) OPTIME
TOPICAL | Status: DC | PRN
  Administered 2015-05-30: 1000 mL

## 2015-05-30 MED ORDER — LIDOCAINE HCL (CARDIAC) 20 MG/ML IV SOLN
INTRAVENOUS | Status: AC
  Filled 2015-05-30: qty 5

## 2015-05-30 MED ORDER — GLYCOPYRROLATE 0.2 MG/ML IJ SOLN
INTRAMUSCULAR | Status: DC | PRN
  Administered 2015-05-30: 0.6 mg via INTRAVENOUS

## 2015-05-30 MED ORDER — PROPOFOL 10 MG/ML IV BOLUS
INTRAVENOUS | Status: AC
  Filled 2015-05-30: qty 20

## 2015-05-30 MED ORDER — ENOXAPARIN SODIUM 60 MG/0.6ML ~~LOC~~ SOLN: 60.0000 mg | Freq: Two times a day (BID) | SUBCUTANEOUS | Status: DC

## 2015-05-30 MED ORDER — ALBUTEROL SULFATE (2.5 MG/3ML) 0.083% IN NEBU: 2.5000 mg | INHALATION_SOLUTION | Freq: Four times a day (QID) | RESPIRATORY_TRACT | Status: DC | PRN

## 2015-05-30 MED ORDER — ALBUTEROL SULFATE HFA 108 (90 BASE) MCG/ACT IN AERS: 2.0000 | INHALATION_SPRAY | Freq: Four times a day (QID) | RESPIRATORY_TRACT | Status: DC | PRN

## 2015-05-30 MED ORDER — B COMPLEX-C PO TABS
1.0000 | ORAL_TABLET | Freq: Every day | ORAL | Status: DC
  Filled 2015-05-30: qty 1

## 2015-05-30 MED ORDER — SUCCINYLCHOLINE CHLORIDE 20 MG/ML IJ SOLN
INTRAMUSCULAR | Status: DC | PRN
  Administered 2015-05-30: 60 mg via INTRAVENOUS

## 2015-05-30 MED ORDER — ROCURONIUM BROMIDE 50 MG/5ML IV SOLN
INTRAVENOUS | Status: AC
  Filled 2015-05-30: qty 1

## 2015-05-30 MED ORDER — CEFAZOLIN SODIUM-DEXTROSE 2-3 GM-% IV SOLR
INTRAVENOUS | Status: DC | PRN
  Administered 2015-05-30: 2 g via INTRAVENOUS

## 2015-05-30 MED ORDER — OXYCODONE HCL 5 MG/5ML PO SOLN: 5.0000 mg | Freq: Once | ORAL | Status: DC | PRN

## 2015-05-30 MED ORDER — CALCIUM CARBONATE-VITAMIN D 500-200 MG-UNIT PO TABS
1.0000 | ORAL_TABLET | Freq: Two times a day (BID) | ORAL | Status: DC
  Administered 2015-05-30 – 2015-05-31 (×2): 1 via ORAL
  Filled 2015-05-30 (×2): qty 1

## 2015-05-30 MED ORDER — ACETAMINOPHEN 160 MG/5ML PO SOLN
325.0000 mg | ORAL | Status: DC | PRN
  Filled 2015-05-30: qty 20.3

## 2015-05-30 MED ORDER — SODIUM CHLORIDE 0.9 % IR SOLN
Status: DC | PRN
  Administered 2015-05-30: 1000 mL

## 2015-05-30 MED ORDER — ADULT MULTIVITAMIN W/MINERALS CH
1.0000 | ORAL_TABLET | Freq: Every day | ORAL | Status: DC
  Administered 2015-05-31: 1 via ORAL
  Filled 2015-05-30 (×2): qty 1

## 2015-05-30 MED ORDER — OXYCODONE HCL 5 MG PO TABS: 5.0000 mg | ORAL_TABLET | Freq: Once | ORAL | Status: DC | PRN

## 2015-05-30 MED ORDER — CIPROFLOXACIN IN D5W 400 MG/200ML IV SOLN: 400.0000 mg | INTRAVENOUS | Status: DC

## 2015-05-30 MED ORDER — PHENYLEPHRINE 40 MCG/ML (10ML) SYRINGE FOR IV PUSH (FOR BLOOD PRESSURE SUPPORT)
PREFILLED_SYRINGE | INTRAVENOUS | Status: AC
  Filled 2015-05-30: qty 10

## 2015-05-30 MED ORDER — PHENYLEPHRINE HCL 10 MG/ML IJ SOLN
INTRAMUSCULAR | Status: DC | PRN
  Administered 2015-05-30: 80 ug via INTRAVENOUS

## 2015-05-30 MED ORDER — FENTANYL CITRATE (PF) 100 MCG/2ML IJ SOLN
INTRAMUSCULAR | Status: AC
  Administered 2015-05-30: 50 ug via INTRAVENOUS
  Filled 2015-05-30: qty 2

## 2015-05-30 MED ORDER — HYDROCODONE-ACETAMINOPHEN 5-325 MG PO TABS
1.0000 | ORAL_TABLET | ORAL | Status: DC | PRN
  Administered 2015-05-30 (×2): 2 via ORAL
  Administered 2015-05-31: 1 via ORAL
  Filled 2015-05-30: qty 2
  Filled 2015-05-30 (×3): qty 1

## 2015-05-30 MED ORDER — ONDANSETRON HCL 4 MG/2ML IJ SOLN
INTRAMUSCULAR | Status: AC
  Filled 2015-05-30: qty 2

## 2015-05-30 MED ORDER — SODIUM CHLORIDE 0.9 % IV SOLN
INTRAVENOUS | Status: DC | PRN
  Administered 2015-05-30: 7 mL

## 2015-05-30 MED ORDER — BUDESONIDE-FORMOTEROL FUMARATE 80-4.5 MCG/ACT IN AERO: 2.0000 | INHALATION_SPRAY | Freq: Two times a day (BID) | RESPIRATORY_TRACT | Status: DC

## 2015-05-30 MED ORDER — ENOXAPARIN SODIUM 60 MG/0.6ML ~~LOC~~ SOLN
60.0000 mg | Freq: Two times a day (BID) | SUBCUTANEOUS | Status: DC
  Administered 2015-05-31: 60 mg via SUBCUTANEOUS
  Filled 2015-05-30: qty 0.6

## 2015-05-30 MED ORDER — ATENOLOL 25 MG PO TABS
25.0000 mg | ORAL_TABLET | Freq: Every day | ORAL | Status: DC
Start: 2015-05-31 — End: 2015-05-31
  Administered 2015-05-31: 25 mg via ORAL
  Filled 2015-05-30: qty 1

## 2015-05-30 MED ORDER — FENTANYL CITRATE (PF) 100 MCG/2ML IJ SOLN
25.0000 ug | INTRAMUSCULAR | Status: DC | PRN
  Administered 2015-05-30: 50 ug via INTRAVENOUS
  Administered 2015-05-30 (×2): 25 ug via INTRAVENOUS

## 2015-05-30 MED ORDER — LACTATED RINGERS IV SOLN
INTRAVENOUS | Status: DC | PRN
  Administered 2015-05-30: 07:00:00 via INTRAVENOUS

## 2015-05-30 SURGICAL SUPPLY — 41 items
APPLIER CLIP ROT 10 11.4 M/L (STAPLE) ×3
APR CLP MED LRG 11.4X10 (STAPLE) ×1
BAG SPEC RTRVL LRG 6X4 10 (ENDOMECHANICALS) ×1
BLADE SURG ROTATE 9660 (MISCELLANEOUS) IMPLANT
CANISTER SUCTION 2500CC (MISCELLANEOUS) ×3 IMPLANT
CHLORAPREP W/TINT 26ML (MISCELLANEOUS) ×3 IMPLANT
CLIP APPLIE ROT 10 11.4 M/L (STAPLE) ×1 IMPLANT
CONT SPEC 4OZ CLIKSEAL STRL BL (MISCELLANEOUS) ×3 IMPLANT
COVER MAYO STAND STRL (DRAPES) ×3 IMPLANT
COVER SURGICAL LIGHT HANDLE (MISCELLANEOUS) ×3 IMPLANT
DRAPE C-ARM 42X72 X-RAY (DRAPES) ×3 IMPLANT
ELECT REM PT RETURN 9FT ADLT (ELECTROSURGICAL) ×3
ELECTRODE REM PT RTRN 9FT ADLT (ELECTROSURGICAL) ×1 IMPLANT
GLOVE BIOGEL PI IND STRL 7.0 (GLOVE) ×1 IMPLANT
GLOVE BIOGEL PI IND STRL 8 (GLOVE) ×1 IMPLANT
GLOVE BIOGEL PI INDICATOR 7.0 (GLOVE) ×2
GLOVE BIOGEL PI INDICATOR 8 (GLOVE) ×2
GLOVE ECLIPSE 7.5 STRL STRAW (GLOVE) ×3 IMPLANT
GOWN STRL REUS W/ TWL LRG LVL3 (GOWN DISPOSABLE) ×3 IMPLANT
GOWN STRL REUS W/ TWL XL LVL3 (GOWN DISPOSABLE) ×1 IMPLANT
GOWN STRL REUS W/TWL LRG LVL3 (GOWN DISPOSABLE) ×9
GOWN STRL REUS W/TWL XL LVL3 (GOWN DISPOSABLE) ×3
KIT BASIN OR (CUSTOM PROCEDURE TRAY) ×3 IMPLANT
KIT ROOM TURNOVER OR (KITS) ×3 IMPLANT
LIQUID BAND (GAUZE/BANDAGES/DRESSINGS) ×3 IMPLANT
NS IRRIG 1000ML POUR BTL (IV SOLUTION) ×3 IMPLANT
PAD ARMBOARD 7.5X6 YLW CONV (MISCELLANEOUS) ×6 IMPLANT
POUCH SPECIMEN RETRIEVAL 10MM (ENDOMECHANICALS) ×3 IMPLANT
SCISSORS LAP 5X35 DISP (ENDOMECHANICALS) ×3 IMPLANT
SET CHOLANGIOGRAPH 5 50 .035 (SET/KITS/TRAYS/PACK) ×3 IMPLANT
SET IRRIG TUBING LAPAROSCOPIC (IRRIGATION / IRRIGATOR) ×3 IMPLANT
SLEEVE ENDOPATH XCEL 5M (ENDOMECHANICALS) ×3 IMPLANT
SPECIMEN JAR SMALL (MISCELLANEOUS) IMPLANT
SUT MON AB 5-0 PS2 18 (SUTURE) ×3 IMPLANT
TOWEL OR 17X24 6PK STRL BLUE (TOWEL DISPOSABLE) ×3 IMPLANT
TOWEL OR 17X26 10 PK STRL BLUE (TOWEL DISPOSABLE) ×3 IMPLANT
TRAY LAPAROSCOPIC MC (CUSTOM PROCEDURE TRAY) ×3 IMPLANT
TROCAR XCEL BLUNT TIP 100MML (ENDOMECHANICALS) ×3 IMPLANT
TROCAR XCEL NON-BLD 11X100MML (ENDOMECHANICALS) ×3 IMPLANT
TROCAR XCEL NON-BLD 5MMX100MML (ENDOMECHANICALS) ×3 IMPLANT
TUBING INSUFFLATION (TUBING) ×3 IMPLANT

## 2015-05-30 NOTE — Transfer of Care (Signed)
Immediate Anesthesia Transfer of Care Note  Patient: Catherine [name]  Procedure(s) Performed: Procedure(s): LAPAROSCOPIC CHOLECYSTECTOMY WITH INTRAOPERATIVE CHOLANGIOGRAM (N/A)  Patient Location: PACU  Anesthesia Type:General  Level of Consciousness: awake, alert  and oriented  Airway & Oxygen Therapy: Patient connected to face mask oxygen  Post-op Assessment: Report given to RN  Post vital signs: stable  Last Vitals:  Filed Vitals:   05/30/15 0558  BP: 143/71  Pulse: 65  Temp: 36.4 C  Resp: 20    Complications: No apparent anesthesia complications

## 2015-05-30 NOTE — Op Note (Signed)
Preoperative diagnosis: Cholelithiasis and cholecystitis  Postoperative diagnosis: Cholelithiasis and cholecystitis  Surgical procedure: Laparoscopic cholecystectomy with intraoperative cholangiogram  Surgeon: Marland Kitchen [name]. Anuhea Gassner M.D.  Assistant: None  Anesthesia: General Endotracheal  Complications: None  Estimated blood loss: Minimal  Description of procedure: The patient brought to the operating room, placed in the supine position on the operating table, and general endotracheal anesthesia induced. The abdomen was widely sterilely prepped and draped. The patient had received preoperative IV antibiotics and PAS were in place. Patient timeout was performed the correct procedure verified. Standard 4 port technique was used with an open Hassan cannula at the umbilicus and the remainder of the ports placed under direct vision. The gallbladder was visualized. It appeared somewhat chronically inflamed with some omental adhesions. The fundus was grasped and elevated up over the liver and the infundibulum retracted inferiolaterally. Peritoneum anterior and posterior to close triangle was incised and fibrofatty tissue stripped off the neck of the gallbladder toward the porta hepatis. The distal gallbladder was thoroughly dissected. The cystic artery was identified in close triangle and the cystic duct gallbladder junction dissected 360.  A good critical view was obtained. When the anatomy was clear the cystic duct was clipped at the gallbladder junction and an operative cholangiogram obtained through the cystic duct. This showed good filling of a normal common bile duct and intrahepatic ducts with free flow into the duodenum and no filling defects. Following this the Cholangiocath was removed and the cystic duct was doubly clipped proximally and divided. The cystic artery was doubly clipped proximally and distally and divided. The gallbladder was dissected free from its bed using hook cautery and removed  through the umbilical port site. Complete hemostasis was obtained in the gallbladder bed. The right upper quadrant was thoroughly irrigated and hemostasis assured. Trochars were removed and all CO2 evacuated and the [facility] trocar site fascial defect closed. Skin incisions were closed with subcuticular Monocryl and Dermabond. Sponge needle and instrument counts were correct. The patient was taken to PACU in good condition.  Catherine [name]  05/30/2015

## 2015-05-30 NOTE — Anesthesia Preprocedure Evaluation (Addendum)
Anesthesia Evaluation  Patient identified by MRN, date of birth, ID band Patient awake    Reviewed: Allergy & Precautions, NPO status , Patient's Chart, lab work & pertinent test results  History of Anesthesia Complications (+) PONV and history of anesthetic complications  Airway Mallampati: II  TM Distance: >3 FB Neck ROM: Full    Dental  (+) Teeth Intact, Dental Advisory Given   Pulmonary shortness of breath and with exertion, asthma , COPD COPD inhaler, former smoker,  breath sounds clear to auscultation        Cardiovascular hypertension, Pt. on medications and Pt. on home beta blockers + dysrhythmias Atrial Fibrillation + Valvular Problems/Murmurs MR Rhythm:Regular     Neuro/Psych    GI/Hepatic Neg liver ROS, Gall stones   Endo/Other    Renal/GU Renal disease     Musculoskeletal  (+) Arthritis -,   Abdominal (+)  Abdomen: soft. Bowel sounds: normal.  Peds  Hematology negative hematology ROS (+)   Anesthesia Other Findings Last coumadin 9/28 pending INR  Reproductive/Obstetrics                         Anesthesia Physical Anesthesia Plan  ASA: III  Anesthesia Plan: General   Post-op Pain Management:    Induction: Intravenous  Airway Management Planned: Oral ETT  Additional Equipment: None  Intra-op Plan:   Post-operative Plan: Extubation in OR  Informed Consent: I have reviewed the patients History and Physical, chart, labs and discussed the procedure including the risks, benefits and alternatives for the proposed anesthesia with the patient or authorized representative who has indicated his/her understanding and acceptance.   Dental advisory given  Plan Discussed with: CRNA and Surgeon  Anesthesia Plan Comments:         Anesthesia Quick Evaluation

## 2015-05-30 NOTE — Discharge Instructions (Signed)
[facility] ______[facility] LAPAROSCOPIC SURGERY: POST OP INSTRUCTIONS  Continue Lovenox injections twice daily at home as well as Coumadin Have INR  Checked at primary office on Tuesday  Always review your discharge instruction sheet given to you by the facility where your surgery was performed. IF YOU HAVE DISABILITY OR FAMILY LEAVE FORMS, YOU MUST BRING THEM TO THE OFFICE FOR PROCESSING.   DO NOT GIVE THEM TO YOUR DOCTOR.  1. A prescription for pain medication may be given to you upon discharge.  Take your pain medication as prescribed, if needed.  If narcotic pain medicine is not needed, then you may take acetaminophen (Tylenol) or ibuprofen (Advil) as needed. 2. Take your usually prescribed medications unless otherwise directed. 3. If you need a refill on your pain medication, please contact your pharmacy.  They will contact our office to request authorization. Prescriptions will not be filled after 5pm or on week-ends. 4. You should follow a light diet the first few days after arrival home, such as soup and crackers, etc.  Be sure to include lots of fluids daily. 5. Most patients will experience some swelling and bruising in the area of the incisions.  Ice packs will help.  Swelling and bruising can take several days to resolve.  6. It is common to experience some constipation if taking pain medication after surgery.  Increasing fluid intake and taking a stool softener (such as Colace) will usually help or prevent this problem from occurring.  A mild laxative (Milk of Magnesia or Miralax) should be taken according to package instructions if there are no bowel movements after 48 hours. 7. Unless discharge instructions indicate otherwise, you may remove your bandages 24-48 hours after surgery, and you may shower at that time.  You may have steri-strips (small skin tapes) in place directly over the incision.  These strips should be left on the skin for 7-10 days.  If your surgeon used  skin glue on the incision, you may shower in 24 hours.  The glue will flake off over the next 2-3 weeks.  Any sutures or staples will be removed at the office during your follow-up visit. 8. ACTIVITIES:  You may resume regular (light) daily activities beginning the next day--such as daily self-care, walking, climbing stairs--gradually increasing activities as tolerated.  You may have sexual intercourse when it is comfortable.  Refrain from any heavy lifting or straining until approved by your doctor. a. You may drive when you are no longer taking prescription pain medication, you can comfortably wear a seatbelt, and you can safely maneuver your car and apply brakes. b. RETURN TO WORK:  __________________________________________________________ 9. You should see your doctor in the office for a follow-up appointment approximately 2-3 weeks after your surgery.  Make sure that you call for this appointment within a day or two after you arrive home to insure a convenient appointment time. 10. OTHER INSTRUCTIONS: __________________________________________________________________________________________________________________________ __________________________________________________________________________________________________________________________ WHEN TO CALL YOUR DOCTOR: 1. Fever over 101.0 2. Inability to urinate 3. Continued bleeding from incision. 4. Increased pain, redness, or drainage from the incision. 5. Increasing abdominal pain  The clinic staff is available to answer your questions during regular business hours.  Please dont hesitate to call and ask to speak to one of the nurses for clinical concerns.  If you have a medical emergency, go to the nearest emergency room or call 911.  A surgeon from [facility] is always on call at the hospital. [address], [place], [place],  [place]  [address] ? [address], [place], [place]   [address] [phone] ? [phone] ?  FAX [phone] Web site: [email or web address]

## 2015-05-30 NOTE — Interval H&P Note (Signed)
History and Physical Interval Note:  05/30/2015 7:14 AM  Catherine [name]  has presented today for surgery, with the diagnosis of cholelithiasis      The various methods of treatment have been discussed with the patient and family. After consideration of risks, benefits and other options for treatment, the patient has consented to  Procedure(s): LAPAROSCOPIC CHOLECYSTECTOMY WITH INTRAOPERATIVE CHOLANGIOGRAM (N/A) as a surgical intervention .  The patient's history has been reviewed, patient examined, no change in status, stable for surgery.  I have reviewed the patient's chart and labs.  Questions were answered to the patient's satisfaction.     Pavlos Yon T

## 2015-05-30 NOTE — Progress Notes (Signed)
Attempted to page Dr. Excell Seltzer, no answer.  Note on chart re: allergy (diarrhea ) with cipro.

## 2015-05-30 NOTE — Anesthesia Procedure Notes (Signed)
Procedure Name: Intubation Date/Time: 05/30/2015 7:29 AM Performed by: Lavell Luster Pre-anesthesia Checklist: Patient identified, Emergency Drugs available, Suction available, Patient being monitored and Timeout performed Patient Re-evaluated:Patient Re-evaluated prior to inductionOxygen Delivery Method: Circle system utilized Preoxygenation: Pre-oxygenation with 100% oxygen Intubation Type: IV induction Ventilation: Mask ventilation without difficulty Laryngoscope Size: Mac and 3 Grade View: Grade III Tube type: Oral Tube size: 7.5 mm Number of attempts: 1 Airway Equipment and Method: Stylet Placement Confirmation: ETT inserted through vocal cords under direct vision,  positive ETCO2 and breath sounds checked- equal and bilateral Secured at: 21 cm Tube secured with: Tape Difficulty Due To: Difficult Airway- due to anterior larynx and Difficult Airway- due to reduced neck mobility Comments: Atraumatic induction and intubation with MAC 3 blade.  Grade III view noted but ETT passed easily.  Henderson Cloud, CRNA Moser verified placement.

## 2015-05-31 DIAGNOSIS — J449 Chronic obstructive pulmonary disease, unspecified: Secondary | ICD-10-CM | POA: Diagnosis not present

## 2015-05-31 DIAGNOSIS — M1990 Unspecified osteoarthritis, unspecified site: Secondary | ICD-10-CM | POA: Diagnosis not present

## 2015-05-31 DIAGNOSIS — Z87891 Personal history of nicotine dependence: Secondary | ICD-10-CM | POA: Diagnosis not present

## 2015-05-31 DIAGNOSIS — K8010 Calculus of gallbladder with chronic cholecystitis without obstruction: Secondary | ICD-10-CM | POA: Diagnosis not present

## 2015-05-31 DIAGNOSIS — Z7901 Long term (current) use of anticoagulants: Secondary | ICD-10-CM | POA: Diagnosis not present

## 2015-05-31 DIAGNOSIS — I10 Essential (primary) hypertension: Secondary | ICD-10-CM | POA: Diagnosis not present

## 2015-05-31 DIAGNOSIS — I4891 Unspecified atrial fibrillation: Secondary | ICD-10-CM | POA: Diagnosis not present

## 2015-05-31 LAB — PROTIME-INR
INR: 1.22 (ref 0.00–1.49)
Prothrombin Time: 15.6 seconds — ABNORMAL HIGH (ref 11.6–15.2)

## 2015-05-31 LAB — CBC
HEMATOCRIT: 37.5 % (ref 36.0–46.0)
Hemoglobin: 12.5 g/dL (ref 12.0–15.0)
MCH: 30.9 pg (ref 26.0–34.0)
MCHC: 33.3 g/dL (ref 30.0–36.0)
MCV: 92.8 fL (ref 78.0–100.0)
Platelets: 163 10*3/uL (ref 150–400)
RBC: 4.04 MIL/uL (ref 3.87–5.11)
RDW: 13.1 % (ref 11.5–15.5)
WBC: 8.4 10*3/uL (ref 4.0–10.5)

## 2015-05-31 LAB — CREATININE, SERUM
Creatinine, Ser: 0.87 mg/dL (ref 0.44–1.00)
GFR calc non Af Amer: >60 mL/min (ref >60)

## 2015-05-31 NOTE — Discharge Summary (Signed)
Physician Discharge Summary  Patient ID: Catherine [name] MRN: [id number] DOB/AGE: [birth year] 80 y.o.  Admit date: 05/30/2015 Discharge date: 05/31/2015  Admission Diagnoses:  Chronic cholecystitis  Discharge Diagnoses:  Same; post lap chole  Active Problems:   * No active hospital problems. *   Surgery:  Laparoscopic cholecystectomy with IOC  Discharged Condition: improved  Hospital Course:   Had surgery.  Kept on Lovenox.  Ready for discharge on PD 1 on Lovenox bridge  Consults: none  Significant Diagnostic Studies: IOC    Discharge Exam: Blood pressure 131/55, pulse 55, temperature 97.5 F (36.4 C), temperature source Oral, resp. rate 18, height 5\' 3"  (1.6 m), weight 68.675 kg (151 lb 6.4 oz), SpO2 98 %. Incisions OK  Disposition: 01-Home or Self Care  Discharge Instructions    Diet - low sodium heart healthy    Complete by:  As directed      Discharge instructions    Complete by:  As directed   Resume coumadin tomorrow Check INR on Tuesday Lovenox script was sent to your pharmacy     Increase activity slowly    Complete by:  As directed             Medication List    TAKE these medications        acetaminophen 500 MG tablet  Commonly known as:  TYLENOL  Take 1,000 mg by mouth every 8 (eight) hours as needed for mild pain or headache.     albuterol 108 (90 BASE) MCG/ACT inhaler  Commonly known as:  PROVENTIL HFA;VENTOLIN HFA  Inhale 2 puffs into the lungs every 6 (six) hours as needed for wheezing or shortness of breath.     atenolol 25 MG tablet  Commonly known as:  TENORMIN  TAKE (1/2) TABLET DAILY.     B-complex with vitamin C tablet  Take 1 tablet by mouth daily.     benzonatate 100 MG capsule  Commonly known as:  TESSALON  Take 1 capsule (100 mg total) by mouth 3 (three) times daily as needed for cough.     budesonide-formoterol 80-4.5 MCG/ACT inhaler  Commonly known as:  SYMBICORT  Inhale 2 puffs into the lungs 2 (two) times daily.     calcium-vitamin D 250-125 MG-UNIT per tablet  Commonly known as:  OSCAL WITH D  Take 1 tablet by mouth 2 (two) times daily.     diazepam 5 MG tablet  Commonly known as:  VALIUM  Take 2.5 mg by mouth daily as needed for anxiety (for neuropathy).     enoxaparin 60 MG/0.6ML injection  Commonly known as:  LOVENOX  Inject 0.6 mLs (60 mg total) into the skin every 12 (twelve) hours.  Start taking on:  06/01/2015     furosemide 80 MG tablet  Commonly known as:  LASIX  Take 40 mg by mouth 2 (two) times daily.     multivitamin tablet  Take 1 tablet by mouth daily.     potassium chloride 10 MEQ tablet  Commonly known as:  K-DUR  TAKE 2 TABLETS 5 TIMES A DAY     triamterene-hydrochlorothiazide 37.5-25 MG per tablet  Commonly known as:  MAXZIDE-25  TAKE 1 TABLET DAILY     warfarin 2 MG tablet  Commonly known as:  COUMADIN  Take as directed by anticoagulation clinic           Follow-up Information    Follow up with Edward Jolly, MD. Schedule an appointment as soon as possible for a  visit in 3 weeks.   Specialty:  General Surgery   Contact information:   [address] [place] [address] [phone]       Signed: Pedro Earls 05/31/2015, 12:05 PM

## 2015-06-03 ENCOUNTER — Ambulatory Visit (INDEPENDENT_AMBULATORY_CARE_PROVIDER_SITE_OTHER): Payer: Medicare Other | Admitting: General Practice

## 2015-06-03 ENCOUNTER — Encounter (HOSPITAL_COMMUNITY): Payer: Self-pay | Admitting: General Surgery

## 2015-06-03 DIAGNOSIS — I4891 Unspecified atrial fibrillation: Secondary | ICD-10-CM

## 2015-06-03 LAB — POCT INR: INR: 1.1

## 2015-06-03 NOTE — Anesthesia Postprocedure Evaluation (Signed)
  Anesthesia Post-op Note  Patient: Catherine [name]  Procedure(s) Performed: Procedure(s): LAPAROSCOPIC CHOLECYSTECTOMY WITH INTRAOPERATIVE CHOLANGIOGRAM (N/A)  Patient Location: PACU  Anesthesia Type:General  Level of Consciousness: awake  Airway and Oxygen Therapy: Patient Spontanous Breathing  Post-op Pain: mild  Post-op Assessment: Post-op Vital signs reviewed, Patient's Cardiovascular Status Stable, Respiratory Function Stable, Patent Airway, No signs of Nausea or vomiting and Pain level controlled              Post-op Vital Signs: Reviewed and stable  Last Vitals:  Filed Vitals:   05/31/15 1005  BP: 131/55  Pulse: 55  Temp: 36.4 C  Resp: 18    Complications: No apparent anesthesia complications

## 2015-06-03 NOTE — Progress Notes (Signed)
Pre visit review using our clinic review tool, if applicable. No additional management support is needed unless otherwise documented below in the visit note. 

## 2015-06-03 NOTE — Progress Notes (Signed)
I have reviewed and agree with the plan. 

## 2015-06-09 ENCOUNTER — Ambulatory Visit (INDEPENDENT_AMBULATORY_CARE_PROVIDER_SITE_OTHER): Payer: Medicare Other | Admitting: General Practice

## 2015-06-09 DIAGNOSIS — Z5181 Encounter for therapeutic drug level monitoring: Secondary | ICD-10-CM | POA: Diagnosis not present

## 2015-06-09 DIAGNOSIS — Z7901 Long term (current) use of anticoagulants: Secondary | ICD-10-CM

## 2015-06-09 DIAGNOSIS — I4891 Unspecified atrial fibrillation: Secondary | ICD-10-CM | POA: Diagnosis not present

## 2015-06-09 LAB — POCT INR: INR: 2.3

## 2015-06-09 NOTE — Progress Notes (Signed)
Agree with Coumadin management 

## 2015-06-09 NOTE — Progress Notes (Signed)
Pre visit review using our clinic review tool, if applicable. No additional management support is needed unless otherwise documented below in the visit note. 

## 2015-06-23 ENCOUNTER — Other Ambulatory Visit: Payer: Self-pay | Admitting: Cardiology

## 2015-06-25 ENCOUNTER — Ambulatory Visit (INDEPENDENT_AMBULATORY_CARE_PROVIDER_SITE_OTHER): Payer: Medicare Other | Admitting: Family Medicine

## 2015-06-25 ENCOUNTER — Other Ambulatory Visit: Payer: Self-pay | Admitting: Internal Medicine

## 2015-06-25 ENCOUNTER — Encounter: Payer: Self-pay | Admitting: Family Medicine

## 2015-06-25 VITALS — BP 120/80 | HR 78 | Temp 98.5°F | Ht 63.0 in | Wt 150.1 lb

## 2015-06-25 DIAGNOSIS — R05 Cough: Secondary | ICD-10-CM

## 2015-06-25 DIAGNOSIS — R059 Cough, unspecified: Secondary | ICD-10-CM

## 2015-06-25 MED ORDER — AZITHROMYCIN 250 MG PO TABS: ORAL_TABLET | ORAL | Status: AC

## 2015-06-25 NOTE — Progress Notes (Signed)
Subjective:    Patient ID: Catherine [name], female    DOB: 08/18/1935, 80 y.o.   MRN: [id number]  HPI Patient seen with upper [place] illness. She had onset this past Sunday of sore throat and low-grade fever around 100.1. She's had no fever since then. She now has productive cough of yellow sputum. Diffuse body aches. She had recent cholecystectomy that went uneventfully. Denies any recent abdominal pains. Nonsmoker. She had a long respiratory infection last spring. She also had recent kidney stone but denies any dysuria or flank pain at this time. She has multiple antibiotics intolerances or allergies but has done okay with Zithromax in the past.  Past Medical History  Diagnosis Date  . Hypertension   . Atrial fibrillation, chronic   . Heart murmur   . Arthritis   . UTI (urinary tract infection)   . Valvular heart disease     Has mitral stenosis with prior mitral commissurotomy in 1970  . H/O: rheumatic fever   . Chronic anticoagulation   . Asthma     last attack 02/2015  . PONV (postoperative nausea and vomiting)     ' SOMETIMES', BUT NOT ALWAYS"  . Chronic kidney disease     "RIGHT MANY KIDNEY INFECTIONS AND STONES"   Past Surgical History  Procedure Laterality Date  . Cardiac catheterization    . Back surgery      neurosurgery  . Commissurotomy  1970  . Other surgical history      hysterectomy  . Cataract surg    . Abdominal hysterectomy  1983    endometriosis  . Eye surgery    . Cholecystectomy N/A 05/30/2015    Procedure: LAPAROSCOPIC CHOLECYSTECTOMY WITH INTRAOPERATIVE CHOLANGIOGRAM;  Surgeon: Excell Seltzer, MD;  Location: [place] [facility];  Service: General;  Laterality: N/A;    reports that she quit smoking about 39 years ago. Her smoking use included Cigarettes. She has a 15 pack-year smoking history. She has never used smokeless tobacco. She reports that she does not drink alcohol or use illicit drugs. family history includes Leukemia in her father. Allergies  Allergen  Reactions  . Ace Inhibitors Other (See Comments)    unknown  . Aldactone [Spironolactone]     dyspnea  . Benazepril Hcl     cough  . Ciprocin-Fluocin-Procin [Fluocinolone Acetonide] Other (See Comments)    unknown  . Codeine Nausea Only  . Diltiazem     ha  . Flagyl [Metronidazole Hcl] Other (See Comments)    headache  . Flovent [Fluticasone Propionate]     Leg cramps  . Gabapentin Swelling  . Lyrica [Pregabalin] Swelling  . Nitrofurantoin Monohyd Macro     nausea  . Quinidine     Fever diarrhea  . Simvastatin Other (See Comments)    Leg pain, myalgia  . Tramadol Nausea Only  . Verapamil     myalgias  . Amoxicillin Palpitations    tachy  . Ciprofloxacin Diarrhea      Review of Systems  Constitutional: Positive for chills and fatigue.  HENT: Positive for congestion.   Respiratory: Positive for cough.   Genitourinary: Negative for dysuria.  Neurological: Negative for headaches.       Objective:   Physical Exam  Constitutional: She appears well-developed and well-nourished.  HENT:  Right Ear: External ear normal.  Left Ear: External ear normal.  Mouth/Throat: Oropharynx is clear and moist.  Neck: Neck supple.  Cardiovascular: Normal rate and regular rhythm.   Pulmonary/Chest: Effort normal and breath sounds  normal. No respiratory distress. She has no wheezes. She has no rales.  Lymphadenopathy:    She has no cervical adenopathy.          Assessment & Plan:  Upper respiratory infection with cough. Patient has history of recurrent pneumonia in the past. We explained this may be all viral but she has been prone to lower respiratory infection the past. With recent low-grade fever we'll start Zithromax for 5 days. Continue Mucinex. Follow-up promptly for increasing fever or any shortness of breath. She plans to get her Coumadin monitored on Monday

## 2015-06-25 NOTE — Progress Notes (Signed)
Pre visit review using our clinic review tool, if applicable. No additional management support is needed unless otherwise documented below in the visit note. 

## 2015-06-25 NOTE — Patient Instructions (Signed)
Follow up for any fever or increased shortness of breath. 

## 2015-06-27 ENCOUNTER — Other Ambulatory Visit: Payer: Self-pay | Admitting: Adult Health

## 2015-06-27 ENCOUNTER — Telehealth: Payer: Self-pay | Admitting: Family Medicine

## 2015-06-27 MED ORDER — FLUCONAZOLE 150 MG PO TABS: 150.0000 mg | ORAL_TABLET | Freq: Once | ORAL | Status: DC

## 2015-06-27 NOTE — Telephone Encounter (Signed)
Pt is getting a yeast infection from antibiotic she was prescribed, she would like something called in to aid this. Catherine [name]

## 2015-06-27 NOTE — Telephone Encounter (Signed)
Please advise 

## 2015-06-27 NOTE — Telephone Encounter (Signed)
Sent in script for Diflucan.

## 2015-07-02 ENCOUNTER — Telehealth: Payer: Self-pay | Admitting: Family Medicine

## 2015-07-02 NOTE — Telephone Encounter (Signed)
Appointment scheduled Friday with [facility] for Coumadin check.

## 2015-07-02 NOTE — Telephone Encounter (Signed)
Pt had an appt with cindy boyd for coumadin check on 07/07/15. Jenny Reichmann will not be in office on 07/07/15. Pt states she is on abx and md does not want her to wait to long to have her coumadin recheck. Please advise

## 2015-07-04 ENCOUNTER — Ambulatory Visit: Payer: Medicare Other | Admitting: Family

## 2015-07-04 DIAGNOSIS — I4891 Unspecified atrial fibrillation: Secondary | ICD-10-CM | POA: Diagnosis not present

## 2015-07-04 DIAGNOSIS — Z5181 Encounter for therapeutic drug level monitoring: Secondary | ICD-10-CM

## 2015-07-04 DIAGNOSIS — Z7901 Long term (current) use of anticoagulants: Secondary | ICD-10-CM

## 2015-07-04 LAB — POCT INR: INR: 2.4

## 2015-07-04 NOTE — Patient Instructions (Signed)
Continue 1 tablet daily and 1/2 tablet on Wednesdays.   Re-check in 4 weeks.  Anticoagulation Dose Instructions as of 07/04/2015      Dorene Grebe Tue Wed Thu Fri Sat   New Dose 2 mg 2 mg 2 mg 1 mg 2 mg 2 mg 2 mg    Description        Continue 1 tablet daily and 1/2 tablet on Wednesdays.   Re-check in 4 weeks.

## 2015-07-07 ENCOUNTER — Ambulatory Visit: Payer: Medicare Other

## 2015-07-29 DIAGNOSIS — R1011 Right upper quadrant pain: Secondary | ICD-10-CM | POA: Diagnosis not present

## 2015-08-04 ENCOUNTER — Ambulatory Visit (INDEPENDENT_AMBULATORY_CARE_PROVIDER_SITE_OTHER): Payer: Medicare Other | Admitting: General Practice

## 2015-08-04 DIAGNOSIS — Z5181 Encounter for therapeutic drug level monitoring: Secondary | ICD-10-CM

## 2015-08-04 DIAGNOSIS — I4891 Unspecified atrial fibrillation: Secondary | ICD-10-CM

## 2015-08-04 LAB — POCT INR: INR: 1.9

## 2015-08-04 NOTE — Progress Notes (Signed)
Agree with Coumadin management 

## 2015-08-04 NOTE — Progress Notes (Signed)
Pre visit review using our clinic review tool, if applicable. No additional management support is needed unless otherwise documented below in the visit note. 

## 2015-08-13 ENCOUNTER — Other Ambulatory Visit: Payer: Self-pay | Admitting: Family Medicine

## 2015-08-27 ENCOUNTER — Ambulatory Visit: Payer: Medicare Other | Admitting: Cardiology

## 2015-08-27 DIAGNOSIS — R109 Unspecified abdominal pain: Secondary | ICD-10-CM | POA: Diagnosis not present

## 2015-08-27 DIAGNOSIS — R140 Abdominal distension (gaseous): Secondary | ICD-10-CM | POA: Diagnosis not present

## 2015-09-01 ENCOUNTER — Ambulatory Visit (INDEPENDENT_AMBULATORY_CARE_PROVIDER_SITE_OTHER): Payer: Medicare Other | Admitting: General Practice

## 2015-09-01 DIAGNOSIS — Z7901 Long term (current) use of anticoagulants: Secondary | ICD-10-CM | POA: Diagnosis not present

## 2015-09-01 DIAGNOSIS — I4891 Unspecified atrial fibrillation: Secondary | ICD-10-CM

## 2015-09-01 DIAGNOSIS — Z5181 Encounter for therapeutic drug level monitoring: Secondary | ICD-10-CM

## 2015-09-01 LAB — POCT INR: INR: 1.9

## 2015-09-01 NOTE — Progress Notes (Signed)
Pre visit review using our clinic review tool, if applicable. No additional management support is needed unless otherwise documented below in the visit note. 

## 2015-09-02 ENCOUNTER — Other Ambulatory Visit: Payer: Self-pay | Admitting: Family Medicine

## 2015-09-08 ENCOUNTER — Encounter (HOSPITAL_COMMUNITY): Payer: Self-pay | Admitting: Nurse Practitioner

## 2015-09-08 ENCOUNTER — Emergency Department (HOSPITAL_COMMUNITY): Payer: Medicare Other

## 2015-09-08 ENCOUNTER — Telehealth: Payer: Self-pay | Admitting: Family Medicine

## 2015-09-08 ENCOUNTER — Emergency Department (HOSPITAL_COMMUNITY)
Admission: EM | Admit: 2015-09-08 | Discharge: 2015-09-08 | Disposition: A | Discharge status: Home / Self Care | Payer: Medicare Other | Attending: Emergency Medicine | Admitting: Emergency Medicine

## 2015-09-08 DIAGNOSIS — Z880 Allergy status to penicillin: Secondary | ICD-10-CM | POA: Insufficient documentation

## 2015-09-08 DIAGNOSIS — I129 Hypertensive chronic kidney disease with stage 1 through stage 4 chronic kidney disease, or unspecified chronic kidney disease: Secondary | ICD-10-CM | POA: Diagnosis not present

## 2015-09-08 DIAGNOSIS — J441 Chronic obstructive pulmonary disease with (acute) exacerbation: Secondary | ICD-10-CM | POA: Diagnosis not present

## 2015-09-08 DIAGNOSIS — R05 Cough: Secondary | ICD-10-CM | POA: Diagnosis not present

## 2015-09-08 DIAGNOSIS — Z87891 Personal history of nicotine dependence: Secondary | ICD-10-CM | POA: Insufficient documentation

## 2015-09-08 DIAGNOSIS — R011 Cardiac murmur, unspecified: Secondary | ICD-10-CM | POA: Diagnosis not present

## 2015-09-08 DIAGNOSIS — Z87440 Personal history of urinary (tract) infections: Secondary | ICD-10-CM | POA: Insufficient documentation

## 2015-09-08 DIAGNOSIS — N189 Chronic kidney disease, unspecified: Secondary | ICD-10-CM | POA: Diagnosis not present

## 2015-09-08 DIAGNOSIS — R0602 Shortness of breath: Secondary | ICD-10-CM | POA: Diagnosis present

## 2015-09-08 DIAGNOSIS — Z79899 Other long term (current) drug therapy: Secondary | ICD-10-CM | POA: Diagnosis not present

## 2015-09-08 DIAGNOSIS — M1990 Unspecified osteoarthritis, unspecified site: Secondary | ICD-10-CM | POA: Insufficient documentation

## 2015-09-08 HISTORY — DX: Chronic obstructive pulmonary disease, unspecified: J44.9

## 2015-09-08 LAB — BRAIN NATRIURETIC PEPTIDE: B Natriuretic Peptide: 130.5 pg/mL — ABNORMAL HIGH (ref 0.0–100.0)

## 2015-09-08 LAB — CBC
HEMATOCRIT: 41.5 % (ref 36.0–46.0)
HEMOGLOBIN: 14 g/dL (ref 12.0–15.0)
MCH: 30.5 pg (ref 26.0–34.0)
MCHC: 33.7 g/dL (ref 30.0–36.0)
MCV: 90.4 fL (ref 78.0–100.0)
PLATELETS: 198 10*3/uL (ref 150–400)
RBC: 4.59 MIL/uL (ref 3.87–5.11)
RDW: 13.6 % (ref 11.5–15.5)
WBC: 8 10*3/uL (ref 4.0–10.5)

## 2015-09-08 LAB — PROTIME-INR
INR: 2.05 — AB (ref 0.00–1.49)
Prothrombin Time: 23 seconds — ABNORMAL HIGH (ref 11.6–15.2)

## 2015-09-08 LAB — BASIC METABOLIC PANEL
ANION GAP: 7 (ref 5–15)
BUN: 8 mg/dL (ref 6–20)
CO2: 33 mmol/L — ABNORMAL HIGH (ref 22–32)
Calcium: 9.4 mg/dL (ref 8.9–10.3)
Chloride: 102 mmol/L (ref 101–111)
Creatinine, Ser: 0.8 mg/dL (ref 0.44–1.00)
Glucose, Bld: 88 mg/dL (ref 65–99)
POTASSIUM: 3.5 mmol/L (ref 3.5–5.1)
SODIUM: 142 mmol/L (ref 135–145)

## 2015-09-08 MED ORDER — ALBUTEROL SULFATE HFA 108 (90 BASE) MCG/ACT IN AERS
2.0000 | INHALATION_SPRAY | Freq: Four times a day (QID) | RESPIRATORY_TRACT | Status: DC
  Administered 2015-09-08: 2 via RESPIRATORY_TRACT
  Filled 2015-09-08: qty 6.7

## 2015-09-08 MED ORDER — METHYLPREDNISOLONE SODIUM SUCC 125 MG IJ SOLR
125.0000 mg | Freq: Once | INTRAMUSCULAR | Status: AC
  Administered 2015-09-08: 125 mg via INTRAVENOUS
  Filled 2015-09-08: qty 2

## 2015-09-08 MED ORDER — PREDNISONE 20 MG PO TABS: 40.0000 mg | ORAL_TABLET | Freq: Every day | ORAL | Status: AC

## 2015-09-08 MED ORDER — ALBUTEROL SULFATE (2.5 MG/3ML) 0.083% IN NEBU
5.0000 mg | INHALATION_SOLUTION | Freq: Once | RESPIRATORY_TRACT | Status: AC
  Administered 2015-09-08: 5 mg via RESPIRATORY_TRACT
  Filled 2015-09-08: qty 6

## 2015-09-08 NOTE — Telephone Encounter (Signed)
[facility] - Client [place] [facility] Center  Patient Name: Catherine [name]  DOB: 03-05-1935    Initial Comment Caller states c/o cold symptoms, fever, wheezing   Nurse Assessment  Nurse: Wayne Sever, RN, Tillie Rung Date/Time (Eastern Time): 09/08/2015 11:00:52 AM  Confirm and document reason for call. If symptomatic, describe symptoms. ---Caller is having cold symptoms. She is having trouble getting her breath. She has been using her breathing treatments. She has fever of 100.1. She says the doctor does not have appointments today and she does not know what to do. She is coughing up a brown phlegm  Has the patient traveled out of the country within the last 30 days? ---No  Does the patient have any new or worsening symptoms? ---Yes  Will a triage be completed? ---Yes  Related visit to physician within the last 2 weeks? ---No  Does the PT have any chronic conditions? (i.e. diabetes, asthma, etc.) ---Yes  List chronic conditions. ---A-fib, Mitral Valve Stenosis, Asthma, HTN  Is this a behavioral health or substance abuse call? ---No     Guidelines    Guideline Title Affirmed Question Affirmed Notes  Cough - Acute Productive Wheezing is present    Final Disposition User   See Physician within 4 Hours (or PCP triage) Wayne Sever, RN, [facility] - ED   Disagree/Comply: Comply

## 2015-09-08 NOTE — ED Provider Notes (Signed)
CSN: [id number]     Arrival date & time 09/08/15  1221 History   First MD Initiated Contact with Patient 09/08/15 1714     Chief Complaint  Patient presents with  . Shortness of Breath     (Consider location/radiation/quality/duration/timing/severity/associated sxs/prior Treatment) HPI Patient presents with concern of 2 days of difficulty breathing, persistent cough. There is associated chest soreness with coughing, but otherwise no chest pain. No lightheadedness, syncope. Patient was febrile earlier today as well. Minimal relief with using home albuterol and Symbicort. No confusion, disorientation, nausea, vomiting, diarrhea. Patient notes that she has frequent episodes of breathing difficulty, but has not been on antibiotics or steroids in at least several months.  Past Medical History  Diagnosis Date  . Hypertension   . Atrial fibrillation, chronic ([place])   . Heart murmur   . Arthritis   . UTI (urinary tract infection)   . Valvular heart disease     Has mitral stenosis with prior mitral commissurotomy in 1970  . H/O: rheumatic fever   . Chronic anticoagulation   . Asthma     last attack 02/2015  . PONV (postoperative nausea and vomiting)     ' SOMETIMES', BUT NOT ALWAYS"  . Chronic kidney disease     "RIGHT MANY KIDNEY INFECTIONS AND STONES"  . COPD (chronic obstructive pulmonary disease) [facility])    Past Surgical History  Procedure Laterality Date  . Cardiac catheterization    . Back surgery      neurosurgery  . Commissurotomy  1970  . Other surgical history      hysterectomy  . Cataract surg    . Abdominal hysterectomy  1983    endometriosis  . Eye surgery    . Cholecystectomy N/A 05/30/2015    Procedure: LAPAROSCOPIC CHOLECYSTECTOMY WITH INTRAOPERATIVE CHOLANGIOGRAM;  Surgeon: Excell Seltzer, MD;  Location: [facility] OR;  Service: General;  Laterality: N/A;   Family History  Problem Relation Age of Onset  . Leukemia Father    Social History  Substance Use Topics   . Smoking status: Former Smoker -- 1.00 packs/day for 15 years    Types: Cigarettes    Quit date: 09/28/1975  . Smokeless tobacco: Never Used  . Alcohol Use: No   OB History    No data available     Review of Systems  Constitutional:       Per HPI, otherwise negative  HENT:       Per HPI, otherwise negative  Respiratory:       Per HPI, otherwise negative  Cardiovascular:       Per HPI, otherwise negative  Gastrointestinal: Negative for vomiting.  Endocrine:       Negative aside from HPI  Genitourinary:       Neg aside from HPI   Musculoskeletal:       Per HPI, otherwise negative  Skin: Negative.   Neurological: Negative for syncope.      Allergies  Aldactone; Amoxicillin; Diltiazem; Flagyl; Flovent; Gabapentin; Lyrica; Quinidine; Simvastatin; Tramadol; Verapamil; Ace inhibitors; Benazepril hcl; Ciprocin-fluocin-procin; Ciprofloxacin; Codeine; and Nitrofurantoin monohyd macro  Home Medications   Prior to Admission medications   Medication Sig Start Date End Date Taking? Authorizing Provider  acetaminophen (TYLENOL) 500 MG tablet Take 1,000 mg by mouth every 8 (eight) hours as needed for mild pain or headache.   Yes Historical Provider, MD  albuterol (PROVENTIL HFA;VENTOLIN HFA) 108 (90 BASE) MCG/ACT inhaler Inhale 2 puffs into the lungs every 6 (six) hours as needed for wheezing or  shortness of breath. 01/15/15  Yes [name], MD  atenolol (TENORMIN) 25 MG tablet TAKE (1/2) TABLET DAILY. Patient taking differently: TAKES 12.5MG  BY MOUTH ONCE DAILY 05/02/15  Yes Darlin Coco, MD  B Complex-C (B-COMPLEX WITH VITAMIN C) tablet Take 1 tablet by mouth daily.   Yes Historical Provider, MD  budesonide-formoterol (SYMBICORT) 80-4.5 MCG/ACT inhaler Inhale 2 puffs into the lungs 2 (two) times daily. Patient taking differently: Inhale 2 puffs into the lungs every morning.  03/14/15  Yes Ripudeep Krystal Eaton, MD  calcium-vitamin D (OSCAL WITH D) 250-125 MG-UNIT per tablet Take 2  tablets by mouth daily.    Yes Historical Provider, MD  diazepam (VALIUM) 5 MG tablet TAKE 1/2 TO 1 TABLET ONCE DAILY AS NEEDED Patient taking differently: TAKE 1/2 TO 1 TABLET ONCE DAILY AS NEEDED FOR MUSCLE SPASMS 08/13/15  Yes [name], MD  furosemide (LASIX) 80 MG tablet Take 40 mg by mouth 2 (two) times daily.   Yes Historical Provider, MD  Multiple Vitamin (MULTIVITAMIN) tablet Take 1 tablet by mouth daily.     Yes Historical Provider, MD  Phenylephrine-DM-GG-APAP (MUCINEX FAST-MAX PO) Take 30 mLs by mouth 2 (two) times daily as needed (for congestion).   Yes Historical Provider, MD  potassium chloride (K-DUR) 10 MEQ tablet Take 2 tablets (20 mEq total) by mouth 5 (five) times daily. Patient taking differently: Take 10 mEq by mouth daily.  06/24/15  Yes Darlin Coco, MD  triamterene-hydrochlorothiazide (MAXZIDE-25) 37.5-25 MG per tablet TAKE 1 TABLET DAILY Patient taking differently: TAKES 1 TAB BY MOUTH ONCE DAILY 05/14/15  Yes Darlin Coco, MD  warfarin (COUMADIN) 2 MG tablet Take as directed by anticoagulation clinic Patient taking differently: takes 2mg  by mouth daily at bedtime 09/02/15  Yes [name], MD  benzonatate (TESSALON) 100 MG capsule Take 1 capsule (100 mg total) by mouth 3 (three) times daily as needed for cough. 03/14/15   Ripudeep Krystal Eaton, MD  fluconazole (DIFLUCAN) 150 MG tablet Take 1 tablet (150 mg total) by mouth once. 06/27/15   Dorothyann Peng, NP   BP 118/80 mmHg  Pulse 76  Temp(Src) 98.5 F (36.9 C) (Oral)  Resp 18  Ht 5\' 4"  (1.626 m)  Wt 152 lb 3.2 oz (69.037 kg)  BMI 26.11 kg/m2  SpO2 95% Physical Exam  Constitutional: She is oriented to person, place, and time. She appears well-developed and well-nourished. No distress.  HENT:  Head: Normocephalic and atraumatic.  Eyes: Conjunctivae and EOM are normal.  Cardiovascular: Normal rate and regular rhythm.   Pulmonary/Chest: No stridor. Tachypnea noted. She has decreased breath sounds.   Abdominal: She exhibits no distension.  Musculoskeletal: She exhibits no edema.  Neurological: She is alert and oriented to person, place, and time. No cranial nerve deficit.  Skin: Skin is warm and dry.  Psychiatric: She has a normal mood and affect.  Nursing note and vitals reviewed.   ED Course  Procedures (including critical care time) Labs Review Labs Reviewed  BASIC METABOLIC PANEL - Abnormal; Notable for the following:    CO2 33 (*)    All other components within normal limits  BRAIN NATRIURETIC PEPTIDE - Abnormal; Notable for the following:    B Natriuretic Peptide 130.5 (*)    All other components within normal limits  PROTIME-INR - Abnormal; Notable for the following:    Prothrombin Time 23.0 (*)    INR 2.05 (*)    All other components within normal limits  CBC    Imaging  Review Dg Chest 2 View  09/08/2015  CLINICAL DATA:  Cough and short of breath EXAM: CHEST  2 VIEW COMPARISON:  03/11/2015 FINDINGS: COPD with pulmonary hyperinflation. Scarring in the left lung base unchanged. Negative for pneumonia. Negative for heart failure or mass lesion. No pleural effusion. IMPRESSION: COPD without acute cardiopulmonary abnormality. Chronic scarring left lung base. Electronically Signed   By: Franchot Gallo M.D.   On: 09/08/2015 13:51   I have personally reviewed and evaluated these images and lab results as part of my medical decision-making.   EKG Interpretation   Date/Time:  Monday September 08 2015 13:26:00 EST Ventricular Rate:  82 PR Interval:    QRS Duration: 82 QT Interval:  400 QTC Calculation: 467 R Axis:   39 Text Interpretation:  Atrial fibrillation Septal infarct , age  undetermined Abnormal ECG Atrial fibrillation Abnormal ekg Confirmed by  Carmin Muskrat  MD [phone]) on 09/08/2015 5:32:13 PM     Cardiac 90 A. fib abnormal Pulse ox 96% room air normal   Chart review notable for recent cholecystectomy.   9:31 PM On repeat exam the patient states  that she feels better. She, her daughter and I discussed all findings, need for ongoing therapy.  MDM  History COPD presents with new cough, difficult breathing. Here, no evidence for pneumonia, ACS, pulmonary embolus. Patient improved without oral, Solu-Medrol. Patient discharged in stable condition to continue outpatient steroids, scheduled bronchodilating therapy.  Carmin Muskrat, MD 09/08/15 2131

## 2015-09-08 NOTE — Telephone Encounter (Signed)
Patient checked into ED 

## 2015-09-08 NOTE — Discharge Instructions (Signed)
As discussed, today's evaluation is consistent with a COPD exacerbation. In the next 2 days, please be sure to use your albuterol every 4 hours.  Subsequent, you may use it as needed.  Return here for any concerning changes in your condition.

## 2015-09-08 NOTE — ED Notes (Signed)
She c/o "hacking cough and cant catch my breath" since yesterday. She reports fevers. Denies pain. She is concerned due to hx COPD. She is A&Ox4, breathing easily

## 2015-09-08 NOTE — ED Notes (Signed)
Pt given crackers and sprite.

## 2015-09-17 ENCOUNTER — Ambulatory Visit (INDEPENDENT_AMBULATORY_CARE_PROVIDER_SITE_OTHER): Payer: Medicare Other | Admitting: Family Medicine

## 2015-09-17 VITALS — BP 129/80 | HR 86 | Temp 98.2°F | Resp 16 | Ht 64.0 in | Wt 149.7 lb

## 2015-09-17 DIAGNOSIS — I482 Chronic atrial fibrillation, unspecified: Secondary | ICD-10-CM

## 2015-09-17 DIAGNOSIS — I10 Essential (primary) hypertension: Secondary | ICD-10-CM | POA: Diagnosis not present

## 2015-09-17 DIAGNOSIS — J449 Chronic obstructive pulmonary disease, unspecified: Secondary | ICD-10-CM | POA: Diagnosis not present

## 2015-09-17 DIAGNOSIS — Z23 Encounter for immunization: Secondary | ICD-10-CM

## 2015-09-17 NOTE — Patient Instructions (Signed)
Take your Symbicort 2 puffs TWICE daily May continue with the albuterol as needed.

## 2015-09-17 NOTE — Progress Notes (Signed)
Pre visit review using our clinic review tool, if applicable. No additional management support is needed unless otherwise documented below in the visit note. 

## 2015-09-17 NOTE — Addendum Note (Signed)
Addended by: Elio Forget on: 09/17/2015 10:20 AM   Modules accepted: Orders

## 2015-09-17 NOTE — Progress Notes (Signed)
Subjective:    Patient ID: Catherine [name], female    DOB: [birth year], 80 y.o.   MRN: [id number]  HPI Patient for ER follow-up Long-standing history of COPD. She takes Symbicort but only takes this generally once per day. Week ago Sunday after church in his shorts of breath. She ended up going to emergency department. Is given site Medrol and then prednisone taper. Chest x-ray revealed no pneumonia. She's been taking albuterol as needed. Generally on takes her Symbicort 2 puffs once daily. She is taken Spiriva in the past but did not fill this helped and stopped this on her own.  She has not yet had flu vaccine and also no documentation of Prevnar 13. Denies recent fever or chills. Does have some cough. No dyspnea at rest. Mild dyspnea with exertion. No recent chest pains.  History of atrial fibrillation on chronic Coumadin. Hypertension which is been stable. No recent dizziness or falls.  Past Medical History  Diagnosis Date  . Hypertension   . Atrial fibrillation, chronic ([place])   . Heart murmur   . Arthritis   . UTI (urinary tract infection)   . Valvular heart disease     Has mitral stenosis with prior mitral commissurotomy in 1970  . H/O: rheumatic fever   . Chronic anticoagulation   . Asthma     last attack 02/2015  . PONV (postoperative nausea and vomiting)     ' SOMETIMES', BUT NOT ALWAYS"  . Chronic kidney disease     "RIGHT MANY KIDNEY INFECTIONS AND STONES"  . COPD (chronic obstructive pulmonary disease) [facility])    Past Surgical History  Procedure Laterality Date  . Cardiac catheterization    . Back surgery      neurosurgery  . Commissurotomy  1970  . Other surgical history      hysterectomy  . Cataract surg    . Abdominal hysterectomy  1983    endometriosis  . Eye surgery    . Cholecystectomy N/A 05/30/2015    Procedure: LAPAROSCOPIC CHOLECYSTECTOMY WITH INTRAOPERATIVE CHOLANGIOGRAM;  Surgeon: Excell Seltzer, MD;  Location: [facility];  Service: General;   Laterality: N/A;    reports that she quit smoking about 39 years ago. Her smoking use included Cigarettes. She has a 15 pack-year smoking history. She has never used smokeless tobacco. She reports that she does not drink alcohol or use illicit drugs. family history includes Leukemia in her father. Allergies  Allergen Reactions  . Aldactone [Spironolactone] Other (See Comments)    dyspnea  . Amoxicillin Palpitations    tachycardia  . Diltiazem Other (See Comments)    Causing headaches   . Flagyl [Metronidazole Hcl] Other (See Comments)    Causing headaches    . Flovent [Fluticasone Propionate] Other (See Comments)    Leg cramps  . Gabapentin Swelling  . Lyrica [Pregabalin] Swelling  . Quinidine Diarrhea and Other (See Comments)    Fever diarrhea  . Simvastatin Other (See Comments)    Leg pain, myalgia  . Tramadol Nausea Only  . Verapamil Other (See Comments)    myalgias  . Ace Inhibitors Other (See Comments)    unknown  . Benazepril Hcl Cough  . Ciprocin-Fluocin-Procin [Fluocinolone Acetonide] Other (See Comments)    unknown  . Ciprofloxacin Diarrhea  . Codeine Nausea Only  . Nitrofurantoin Monohyd Macro Nausea Only      Review of Systems  Constitutional: Positive for fatigue. Negative for fever, activity change, appetite change and unexpected weight change.  HENT: Negative for  sore throat and trouble swallowing.   Respiratory: Positive for cough. Negative for shortness of breath and wheezing.   Cardiovascular: Negative for chest pain, palpitations and leg swelling.  Gastrointestinal: Negative for nausea and vomiting.  Genitourinary: Negative for dysuria.  Musculoskeletal: Negative for myalgias and gait problem.  Skin: Negative for rash.  Neurological: Negative for dizziness.  Hematological: Negative for adenopathy.  Psychiatric/Behavioral: Negative for confusion.       Objective:   Physical Exam  Constitutional: She appears well-developed and well-nourished.    HENT:  Mouth/Throat: Oropharynx is clear and moist.  Neck: Neck supple. No JVD present.  Cardiovascular: Normal rate and regular rhythm.   Pulmonary/Chest: Effort normal and breath sounds normal. No respiratory distress. She has no wheezes. She has no rales.  Musculoskeletal: She exhibits no edema.          Assessment & Plan:  #1 COPD with recent exacerbation. She is not taking her Symbicort appropriately. Increase to 2 puffs twice daily. Rinse mouth after use. Continue albuterol as needed. Consider addition of anticholinergic such as spiriva if symptoms persist after maximizing Symbicort #2 hypertension stable and at goal #3 chronic atrial fibrillation rate controlled. Continue chronic Coumadin #4 health maintenance. Needs flu vaccine and Prevnar 13- these are both given today

## 2015-09-26 ENCOUNTER — Other Ambulatory Visit: Payer: Self-pay | Admitting: Cardiology

## 2015-09-26 ENCOUNTER — Other Ambulatory Visit: Payer: Self-pay | Admitting: General Practice

## 2015-09-26 ENCOUNTER — Other Ambulatory Visit: Payer: Self-pay | Admitting: Family Medicine

## 2015-09-26 MED ORDER — WARFARIN SODIUM 2 MG PO TABS: ORAL_TABLET | ORAL | Status: DC

## 2015-10-06 ENCOUNTER — Ambulatory Visit (INDEPENDENT_AMBULATORY_CARE_PROVIDER_SITE_OTHER): Payer: Medicare Other | Admitting: General Practice

## 2015-10-06 DIAGNOSIS — Z5181 Encounter for therapeutic drug level monitoring: Secondary | ICD-10-CM | POA: Diagnosis not present

## 2015-10-06 DIAGNOSIS — Z7901 Long term (current) use of anticoagulants: Secondary | ICD-10-CM | POA: Diagnosis not present

## 2015-10-06 DIAGNOSIS — I4891 Unspecified atrial fibrillation: Secondary | ICD-10-CM

## 2015-10-06 LAB — POCT INR: INR: 2.7

## 2015-10-06 NOTE — Progress Notes (Signed)
Pre visit review using our clinic review tool, if applicable. No additional management support is needed unless otherwise documented below in the visit note. 

## 2015-10-13 ENCOUNTER — Ambulatory Visit: Payer: Medicare Other

## 2015-10-15 DIAGNOSIS — D3132 Benign neoplasm of left choroid: Secondary | ICD-10-CM | POA: Diagnosis not present

## 2015-10-15 DIAGNOSIS — H43811 Vitreous degeneration, right eye: Secondary | ICD-10-CM | POA: Diagnosis not present

## 2015-10-15 DIAGNOSIS — H26493 Other secondary cataract, bilateral: Secondary | ICD-10-CM | POA: Diagnosis not present

## 2015-10-15 DIAGNOSIS — H04123 Dry eye syndrome of bilateral lacrimal glands: Secondary | ICD-10-CM | POA: Diagnosis not present

## 2015-10-15 DIAGNOSIS — Z961 Presence of intraocular lens: Secondary | ICD-10-CM | POA: Diagnosis not present

## 2015-10-20 ENCOUNTER — Other Ambulatory Visit: Payer: Self-pay | Admitting: Gastroenterology

## 2015-10-20 DIAGNOSIS — R140 Abdominal distension (gaseous): Secondary | ICD-10-CM

## 2015-10-20 DIAGNOSIS — R1084 Generalized abdominal pain: Secondary | ICD-10-CM

## 2015-10-20 DIAGNOSIS — R634 Abnormal weight loss: Secondary | ICD-10-CM

## 2015-10-20 DIAGNOSIS — R109 Unspecified abdominal pain: Secondary | ICD-10-CM | POA: Diagnosis not present

## 2015-10-22 ENCOUNTER — Encounter: Payer: Self-pay | Admitting: Cardiology

## 2015-10-22 ENCOUNTER — Ambulatory Visit (INDEPENDENT_AMBULATORY_CARE_PROVIDER_SITE_OTHER): Payer: Medicare Other | Admitting: Cardiology

## 2015-10-22 VITALS — BP 124/80 | HR 60 | Ht 64.0 in | Wt 150.6 lb

## 2015-10-22 DIAGNOSIS — I119 Hypertensive heart disease without heart failure: Secondary | ICD-10-CM | POA: Diagnosis not present

## 2015-10-22 DIAGNOSIS — I050 Rheumatic mitral stenosis: Secondary | ICD-10-CM | POA: Diagnosis not present

## 2015-10-22 DIAGNOSIS — I482 Chronic atrial fibrillation, unspecified: Secondary | ICD-10-CM

## 2015-10-22 LAB — CBC WITH DIFFERENTIAL/PLATELET
BASOS ABS: 0.1 10*3/uL (ref 0.0–0.1)
BASOS PCT: 1 % (ref 0–1)
EOS ABS: 0.1 10*3/uL (ref 0.0–0.7)
EOS PCT: 2 % (ref 0–5)
HCT: 41.5 % (ref 36.0–46.0)
Hemoglobin: 14.3 g/dL (ref 12.0–15.0)
Lymphocytes Relative: 28 % (ref 12–46)
Lymphs Abs: 2 10*3/uL (ref 0.7–4.0)
MCH: 30.6 pg (ref 26.0–34.0)
MCHC: 34.5 g/dL (ref 30.0–36.0)
MCV: 88.7 fL (ref 78.0–100.0)
MPV: 8.9 fL (ref 8.6–12.4)
Monocytes Absolute: 0.6 10*3/uL (ref 0.1–1.0)
Monocytes Relative: 9 % (ref 3–12)
Neutro Abs: 4.3 10*3/uL (ref 1.7–7.7)
Neutrophils Relative %: 60 % (ref 43–77)
PLATELETS: 256 10*3/uL (ref 150–400)
RBC: 4.68 MIL/uL (ref 3.87–5.11)
RDW: 14 % (ref 11.5–15.5)
WBC: 7.2 10*3/uL (ref 4.0–10.5)

## 2015-10-22 LAB — HEPATIC FUNCTION PANEL
ALT: 23 U/L (ref 6–29)
AST: 28 U/L (ref 10–35)
Albumin: 4.6 g/dL (ref 3.6–5.1)
Alkaline Phosphatase: 76 U/L (ref 33–130)
BILIRUBIN DIRECT: 0.2 mg/dL (ref <=0.2)
BILIRUBIN INDIRECT: 0.6 mg/dL (ref 0.2–1.2)
BILIRUBIN TOTAL: 0.8 mg/dL (ref 0.2–1.2)
TOTAL PROTEIN: 7.4 g/dL (ref 6.1–8.1)

## 2015-10-22 LAB — BASIC METABOLIC PANEL
BUN: 15 mg/dL (ref 7–25)
CO2: 29 mmol/L (ref 20–31)
CREATININE: 0.72 mg/dL (ref 0.60–0.93)
Calcium: 9.5 mg/dL (ref 8.6–10.4)
Chloride: 102 mmol/L (ref 98–110)
Glucose, Bld: 104 mg/dL — ABNORMAL HIGH (ref 65–99)
Potassium: 3.5 mmol/L (ref 3.5–5.3)
Sodium: 141 mmol/L (ref 135–146)

## 2015-10-22 LAB — LIPID PANEL
CHOL/HDL RATIO: 4.1 ratio (ref <=5.0)
Cholesterol: 245 mg/dL — ABNORMAL HIGH (ref 125–200)
HDL: 60 mg/dL (ref >=46)
LDL CALC: 153 mg/dL — AB (ref <130)
TRIGLYCERIDES: 158 mg/dL — AB (ref <150)
VLDL: 32 mg/dL — ABNORMAL HIGH (ref <30)

## 2015-10-22 NOTE — Progress Notes (Signed)
Quick Note:  Please report to patient. The recent labs are stable. Continue same medication and careful diet. BS and cholesterol are higher. Watch diet carefully ______

## 2015-10-22 NOTE — Progress Notes (Signed)
Cardiology Office Note   Date:  10/22/2015   ID:  Catherine [name], DOB Oct 25, 1934, MRN [id number]  PCP:  [name], MD  Cardiologist: Darlin Coco MD  Chief Complaint  Patient presents with  . routine follow up    Patient denies chest pain, shortness of breath, le edema, claudication      History of Present Illness: Catherine [name] is a 81 y.o. female who presents for scheduled follow-up visit  She has a past history of rheumatic fever and rheumatic heart disease. She has a history of mitral stenosis. In 1970s she underwent mitral commissurotomy by Dr. Bradly Bienenstock. Her  echocardiogram in June 2015 showed an ejection fraction of 60-65%. There was moderate to severe mitral regurgitation. There was mild aortic insufficiency. There was biatrial enlargement.  She had a follow-up echocardiogram on 03/12/15 which again showed ejection fraction of 60-65%, moderate to severe mitral regurgitation, moderate tricuspid regurgitation, and elevated pulmonary artery pressure 41. She is in chronic established atrial fibrillation. She is on long-term Coumadin.  Since last visit she has been doing well. She continues to complain of lack of enough energy. She stays physically active however. 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 She complains of lack of energy. She has a history of hypokalemia and has to take 5 potassium tablets a day to keep her potassium stable. She has not been experiencing any exertional chest pain. She continues to walk for exercise on her treadmill at home although she cannot walk as fast as she used to without getting out of breath. The patient underwent laparoscopic cholecystectomy by Dr. Excell Seltzer on 05/30/15.  However, she continues to have right upper quadrant discomfort and Dr. Watt Climes has scheduled her for a CT of the abdomen to evaluate further.  Past Medical History  Diagnosis Date  . Hypertension   . Atrial fibrillation,  chronic ([place])   . Heart murmur   . Arthritis   . UTI (urinary tract infection)   . Valvular heart disease     Has mitral stenosis with prior mitral commissurotomy in 1970  . H/O: rheumatic fever   . Chronic anticoagulation   . Asthma     last attack 02/2015  . PONV (postoperative nausea and vomiting)     ' SOMETIMES', BUT NOT ALWAYS"  . Chronic kidney disease     "RIGHT MANY KIDNEY INFECTIONS AND STONES"  . COPD (chronic obstructive pulmonary disease) [facility])     Past Surgical History  Procedure Laterality Date  . Cardiac catheterization    . Back surgery      neurosurgery  . Commissurotomy  1970  . Other surgical history      hysterectomy  . Cataract surg    . Abdominal hysterectomy  1983    endometriosis  . Eye surgery    . Cholecystectomy N/A 05/30/2015    Procedure: LAPAROSCOPIC CHOLECYSTECTOMY WITH INTRAOPERATIVE CHOLANGIOGRAM;  Surgeon: Excell Seltzer, MD;  Location: [facility] OR;  Service: General;  Laterality: N/A;     Current Outpatient Prescriptions  Medication Sig Dispense Refill  . albuterol (PROVENTIL HFA;VENTOLIN HFA) 108 (90 BASE) MCG/ACT inhaler Inhale 2 puffs into the lungs every 6 (six) hours as needed for wheezing or shortness of breath. 1 Inhaler 0  . atenolol (TENORMIN) 25 MG tablet Take 12.5 mg by mouth daily.    . B Complex-C (B-COMPLEX WITH VITAMIN C) tablet Take 1 tablet by mouth daily.    . budesonide-formoterol (  SYMBICORT) 80-4.5 MCG/ACT inhaler Inhale 2 puffs into the lungs 2 (two) times daily.     . diazepam (VALIUM) 5 MG tablet Take 5 mg by mouth at bedtime.    . furosemide (LASIX) 80 MG tablet Take 40 mg by mouth 2 (two) times daily.    . Multiple Vitamin (MULTIVITAMIN) tablet Take 1 tablet by mouth daily.      Marland Kitchen Phenylephrine-DM-GG-APAP (MUCINEX FAST-MAX PO) Take 30 mLs by mouth 2 (two) times daily as needed (for congestion).    . potassium chloride (K-DUR) 10 MEQ tablet Take 50 mEq by mouth 5 (five) times daily.     Marland Kitchen  triamterene-hydrochlorothiazide (MAXZIDE-25) 37.5-25 MG tablet Take 1 tablet by mouth daily.    Marland Kitchen warfarin (COUMADIN) 2 MG tablet Take as directed by anticoagulation clinic 30 tablet 3   No current facility-administered medications for this visit.    Allergies:   Aldactone; Amoxicillin; Diltiazem; Flagyl; Flovent; Gabapentin; Lyrica; Quinidine; Simvastatin; Tramadol; Verapamil; Ace inhibitors; Benazepril hcl; Ciprocin-fluocin-procin; Ciprofloxacin; Codeine; and Nitrofurantoin monohyd macro    Social History:  The patient  reports that she quit smoking about 40 years ago. Her smoking use included Cigarettes. She has a 15 pack-year smoking history. She has never used smokeless tobacco. She reports that she does not drink alcohol or use illicit drugs.   Family History:  The patient's family history includes Leukemia in her father.    ROS:  Please see the history of present illness.   Otherwise, review of systems are positive for none.   All other systems are reviewed and negative.    PHYSICAL EXAM: VS:  BP 124/80 mmHg  Pulse 60  Ht 5\' 4"  (1.626 m)  Wt 150 lb 9.6 oz (68.312 kg)  BMI 25.84 kg/m2 , BMI Body mass index is 25.84 kg/(m^2). GEN: Well nourished, well developed, in no acute distress HEENT: normal Neck: no JVD, carotid bruits, or masses Cardiac: Irregularly irregular.  Grade 2/6 systolic murmur at apex.  No gallop or rub.  No peripheral edema. Respiratory:  clear to auscultation bilaterally, normal work of breathing GI: soft, nontender, nondistended, + BS MS: no deformity or atrophy Skin: warm and dry, no rash Neuro:  Strength and sensation are intact Psych: euthymic mood, full affect   EKG:  EKG is ordered today. The ekg ordered today demonstrates atrial fibrillation with ventricular response 61 bpm.   Recent Labs: 03/03/2015: TSH 2.26 03/11/2015: Magnesium 2.0 03/15/2015: ALT 63* 09/08/2015: B Natriuretic Peptide 130.5*; BUN 8; Creatinine, Ser 0.80; Hemoglobin 14.0;  Platelets 198; Potassium 3.5; Sodium 142    Lipid Panel    Component Value Date/Time   CHOL 221* 03/03/2015 1051   TRIG 157.0* 03/03/2015 1051   HDL 53.20 03/03/2015 1051   CHOLHDL 4 03/03/2015 1051   VLDL 31.4 03/03/2015 1051   LDLCALC 136* 03/03/2015 1051   LDLDIRECT 134.7 08/28/2014 1009      Wt Readings from Last 3 Encounters:  10/22/15 150 lb 9.6 oz (68.312 kg)  09/17/15 149 lb 11.2 oz (67.903 kg)  09/08/15 152 lb 3.2 oz (69.037 kg)        ASSESSMENT AND PLAN:  1. Rheumatic heart disease with mitral stenosis status post mitral commissurotomy in the 1970s 2. permanent atrial fibrillation 3. hypertensive heart disease without heart failure 4. Hypercholesterolemia 5. malaise and fatigue 6.  Status post cholecystectomy.  Still having unexplained right upper quadrant abdominal pain.   Current medicines are reviewed at length with the patient today.  The patient does not have concerns  regarding medicines.  The following changes have been made:  no change  Labs/ tests ordered today include:   Orders Placed This Encounter  Procedures  . Basic metabolic panel  . CBC with Differential/Platelet  . Hepatic function panel  . Lipid panel   Disposition: Continue current medication.  We are checking lab work today.  She will return in 6 months for follow-up office visit with Dr. Oval Linsey   Signed, Darlin Coco MD 10/22/2015 9:21 AM    [address], [place], [place]  [address] Phone: [phone]; Fax: [phone]

## 2015-10-22 NOTE — Patient Instructions (Signed)
Medication Instructions:  Your physician recommends that you continue on your current medications as directed. Please refer to the Current Medication list given to you today.   Labwork: Your physician recommends that you return for lab work in: TODAY (Lipid/liver/bmet/cbc)   Testing/Procedures: None   Follow-Up: Your physician wants you to follow-up in: 6 months with Dr. Oval Linsey. You will receive a reminder letter in the mail two months in advance. If you don't receive a letter, please call our office to schedule the follow-up appointment.   Any Other Special Instructions Will Be Listed Below (If Applicable).     If you need a refill on your cardiac medications before your next appointment, please call your pharmacy.

## 2015-10-27 ENCOUNTER — Ambulatory Visit
Admission: RE | Admit: 2015-10-27 | Discharge: 2015-10-27 | Disposition: A | Discharge status: Home / Self Care | Payer: Medicare Other | Source: Ambulatory Visit | Attending: Gastroenterology | Admitting: Gastroenterology

## 2015-10-27 ENCOUNTER — Telehealth: Payer: Self-pay

## 2015-10-27 DIAGNOSIS — R1084 Generalized abdominal pain: Secondary | ICD-10-CM

## 2015-10-27 DIAGNOSIS — R140 Abdominal distension (gaseous): Secondary | ICD-10-CM

## 2015-10-27 DIAGNOSIS — R1011 Right upper quadrant pain: Secondary | ICD-10-CM | POA: Diagnosis not present

## 2015-10-27 DIAGNOSIS — R634 Abnormal weight loss: Secondary | ICD-10-CM

## 2015-10-27 MED ORDER — IOPAMIDOL (ISOVUE-300) INJECTION 61%
100.0000 mL | Freq: Once | INTRAVENOUS | Status: AC | PRN
  Administered 2015-10-27: 100 mL via INTRAVENOUS

## 2015-10-27 NOTE — Telephone Encounter (Signed)
Spoke with patient and she has been taking K+10 meq 2 tablets 5 times a day Advised to continue her current dose and changed Rx what she is taking

## 2015-10-27 NOTE — Telephone Encounter (Signed)
Pt calling to confirm potassium dose. Last OV was 10/22/15 & she states this is a new dose for her & she wants to speak directly to Alvina Filbert, LPN. She also has a question about her recent labs. Please advise.

## 2015-10-28 ENCOUNTER — Telehealth: Payer: Self-pay | Admitting: Cardiology

## 2015-10-28 DIAGNOSIS — E7800 Pure hypercholesterolemia, unspecified: Secondary | ICD-10-CM

## 2015-10-28 DIAGNOSIS — R7309 Other abnormal glucose: Secondary | ICD-10-CM

## 2015-10-28 NOTE — Telephone Encounter (Signed)
Pt states Dr Watt Climes ordered  CT of abdomen and pelvis that pt had done 10/27/15. Pt states Dr Perley Jain office called her with the results and she was concerned about a couple of things she was told.  Pt states she would like Melinda to give her a call once Dr Mare Ferrari has reviewed the report (in Epic). Pt states she talked with Rip Harbour a couple of days ago and this is a different issue. Pt advised I will forward to Dr Jerelene Redden for review.

## 2015-10-28 NOTE — Telephone Encounter (Signed)
Catherine [name] please call her Wednesday to discuss concerns.

## 2015-10-28 NOTE — Telephone Encounter (Signed)
New message      Pt had a CT scan ordered by Dr Watt Climes.  They are sending a copy to Dr Mare Ferrari.  Patient would like to talk to the nurse or Dr Mare Ferrari to discuss the findings

## 2015-10-30 NOTE — Telephone Encounter (Signed)
Discussed CT with patient and she will keep follow up with Dr Watt Climes next

## 2015-10-31 NOTE — Telephone Encounter (Signed)
Referral for nutrition center put in [place]

## 2015-11-03 ENCOUNTER — Ambulatory Visit (INDEPENDENT_AMBULATORY_CARE_PROVIDER_SITE_OTHER): Payer: Medicare Other | Admitting: General Practice

## 2015-11-03 DIAGNOSIS — I4891 Unspecified atrial fibrillation: Secondary | ICD-10-CM | POA: Diagnosis not present

## 2015-11-03 DIAGNOSIS — Z7901 Long term (current) use of anticoagulants: Secondary | ICD-10-CM

## 2015-11-03 DIAGNOSIS — Z5181 Encounter for therapeutic drug level monitoring: Secondary | ICD-10-CM

## 2015-11-03 LAB — POCT INR: INR: 1.5

## 2015-11-03 NOTE — Progress Notes (Signed)
Pre visit review using our clinic review tool, if applicable. No additional management support is needed unless otherwise documented below in the visit note. INR is low today.  Patient has been eating a lot of broccoli.  Encouraged patient to be consistent with green vegetables.  Dosage boosted for 2 days and then resume current dosage. Patient verbalized understanding.

## 2015-11-05 ENCOUNTER — Telehealth: Payer: Self-pay | Admitting: Cardiology

## 2015-11-05 DIAGNOSIS — R109 Unspecified abdominal pain: Secondary | ICD-10-CM | POA: Diagnosis not present

## 2015-11-05 DIAGNOSIS — R932 Abnormal findings on diagnostic imaging of liver and biliary tract: Secondary | ICD-10-CM | POA: Diagnosis not present

## 2015-11-05 NOTE — Telephone Encounter (Signed)
Visit scheduled for 11/21/15

## 2015-11-05 NOTE — Telephone Encounter (Signed)
Patient called to give update on her visit with Dr Watt Climes Stated it went well and feels much better since seeing him

## 2015-11-05 NOTE — Telephone Encounter (Signed)
Pt calling with info you requested-pls call [phone]

## 2015-11-10 ENCOUNTER — Other Ambulatory Visit: Payer: Self-pay | Admitting: Family Medicine

## 2015-11-11 ENCOUNTER — Telehealth: Payer: Self-pay | Admitting: Dietician

## 2015-11-11 NOTE — Telephone Encounter (Signed)
Spoke with patient's husband.  She has a question regarding her appointment 11/21/15.  Will return call when she is available.

## 2015-11-13 NOTE — Telephone Encounter (Signed)
I am her primary.  Refill OK.  Would advise that she try to reduce to one half tablet at night- if she has not already done so.

## 2015-11-13 NOTE — Telephone Encounter (Signed)
Looks like pt has been seen by [name], Tommi Rumps, Dr. Raliegh Ip and you. Please advise if you are her PCP? If yes then she is due for refill. Okay to fill?

## 2015-11-17 ENCOUNTER — Ambulatory Visit (INDEPENDENT_AMBULATORY_CARE_PROVIDER_SITE_OTHER): Payer: Medicare Other | Admitting: General Practice

## 2015-11-17 ENCOUNTER — Other Ambulatory Visit: Payer: Self-pay | Admitting: General Practice

## 2015-11-17 DIAGNOSIS — Z5181 Encounter for therapeutic drug level monitoring: Secondary | ICD-10-CM | POA: Diagnosis not present

## 2015-11-17 DIAGNOSIS — I4891 Unspecified atrial fibrillation: Secondary | ICD-10-CM

## 2015-11-17 DIAGNOSIS — Z7901 Long term (current) use of anticoagulants: Secondary | ICD-10-CM

## 2015-11-17 LAB — POCT INR: INR: 1.6

## 2015-11-17 MED ORDER — WARFARIN SODIUM 2 MG PO TABS: ORAL_TABLET | ORAL | Status: DC

## 2015-11-17 NOTE — Progress Notes (Signed)
Pre visit review using our clinic review tool, if applicable. No additional management support is needed unless otherwise documented below in the visit note. INR is low today.  Patient denies missing doses.  Encouraged patient to use pill box to manage medication to keep diet steady in regards to dark green leafy vegetables.  Dosage increased by 15 percent.  Re-check in 3 weeks.  Pt verbalized understanding.

## 2015-11-20 ENCOUNTER — Telehealth: Payer: Self-pay | Admitting: Dietician

## 2015-11-20 NOTE — Telephone Encounter (Signed)
Pt called in this morning requesting a call back from [place].

## 2015-11-21 ENCOUNTER — Encounter: Payer: Medicare Other | Attending: Family Medicine | Admitting: Dietician

## 2015-11-21 ENCOUNTER — Ambulatory Visit (INDEPENDENT_AMBULATORY_CARE_PROVIDER_SITE_OTHER): Payer: Medicare Other | Admitting: Family Medicine

## 2015-11-21 ENCOUNTER — Encounter: Payer: Self-pay | Admitting: Dietician

## 2015-11-21 ENCOUNTER — Encounter: Payer: Self-pay | Admitting: Family Medicine

## 2015-11-21 VITALS — Ht 64.0 in | Wt 151.0 lb

## 2015-11-21 VITALS — BP 140/82 | HR 90 | Temp 98.7°F | Wt 153.0 lb

## 2015-11-21 DIAGNOSIS — R05 Cough: Secondary | ICD-10-CM | POA: Diagnosis not present

## 2015-11-21 DIAGNOSIS — E785 Hyperlipidemia, unspecified: Secondary | ICD-10-CM

## 2015-11-21 DIAGNOSIS — H938X3 Other specified disorders of ear, bilateral: Secondary | ICD-10-CM | POA: Diagnosis not present

## 2015-11-21 DIAGNOSIS — E7800 Pure hypercholesterolemia, unspecified: Secondary | ICD-10-CM | POA: Diagnosis not present

## 2015-11-21 DIAGNOSIS — J3489 Other specified disorders of nose and nasal sinuses: Secondary | ICD-10-CM

## 2015-11-21 DIAGNOSIS — R058 Other specified cough: Secondary | ICD-10-CM

## 2015-11-21 DIAGNOSIS — R7309 Other abnormal glucose: Secondary | ICD-10-CM

## 2015-11-21 MED ORDER — PREDNISONE 20 MG PO TABS: ORAL_TABLET | ORAL | Status: DC

## 2015-11-21 MED ORDER — FLUTICASONE PROPIONATE 50 MCG/ACT NA SUSP: 2.0000 | Freq: Every day | NASAL | Status: DC

## 2015-11-21 NOTE — Progress Notes (Signed)
  Medical Nutrition Therapy:  Appt start time: 0945 end time:  1100.   Assessment:  Primary concerns today: Patient is here with her husband.  Her concerns are her vitamin K intake, how to increase her potassium and lower her cholesterol.  Referral for increased cholesterol and increased glucose.  Other hx includes a MV repair in 1970 now on chronic coumadin, HTN, CKD and chronic afib.  Cholesterol 245, triglycerides 158, HDL 60, LDL 153 and glucose of 104 10/22/15.  Patient lives with her husband.  Patient and husband shop and patient cooks.  Most often it is simple meals.  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  Preferred Learning Style:   No preference indicated   Learning Readiness:   Ready  Change in progress   MEDICATIONS: see list   DIETARY INTAKE:  Usual eating pattern includes 1-2 meals and 2-3 snacks per day. Everyday foods include loves fruit.  Avoided foods include dislikes a lot of meat.    24-hr recall:  B ( AM): Oatmeal with 2% milk, apple, almonds, stevia and black coffee or  with coffeemate Snk ( AM): occasional homemade peanut butter cracker or melon or 1/2 banana L ( PM): leftovers (chicken pie- homeade) or 1/2 peanut butter sandwich or soup Snk ( PM): nuts or craisins D ( PM): popcorn and fruit and nuts or peanut butter OR 1/2 sandwich and fruit Snk ( PM): crackers and peanut butter (homemade) Beverages: black coffee, water, unsweetened tea with stevia  Usual physical activity: 30 minutes of walking on treadmill 5 x per week.  Estimated energy needs: 1400 calories 158 g carbohydrates 88 g protein 47 g fat  Progress Towards Goal(s):  In progress.   Nutritional Diagnosis:  NB-1.1 Food and nutrition-related knowledge deficit As related to balance of protein, carbohydrate, fat.  As evidenced by diet hx and patient report.    Intervention:  Nutrition counseling/education for a heart healthy balance carbohydrate  diet.  Great job with the changes that you have made (reducing sugar). Be consistent with your green leafy vegetables intake due to the coumadin. Read labels for fat and sodium.  Aim for no more than 2000 mg sodium per day.   No added salt in cooking, rinse canned vegetables, avoid processed meat. Small amounts of protein each time you eat.  (chicken, [place], fish, meat, nuts, 1 ounce cheese, yogurt) Cut back on your use of butter.  Increase your non starchy vegetable intake. Sweet potatoes and dried beans and other vegetables are great sources of potassium. American Heart Association and American Diabetes Association are great resources for recipes [employer] or bookstore)  Keep up your walking!  Teaching Method Utilized:  Visual Auditory Hands on  Handouts given during visit include:  Heart Healthy nutrition therapy from AND  Coumadin/Vitamin K sheet from AND  Heart Healthy cooking and shopping from AND  Plant Sterols/Stanols from AND  Label reading for heart health from AND  Meal plan card  My plate   Barriers to learning/adherence to lifestyle change: none  Demonstrated degree of understanding via:  Teach Back   Monitoring/Evaluation:  Dietary intake, exercise, label reading, and body weight prn.

## 2015-11-21 NOTE — Patient Instructions (Addendum)
i agree with you this could be allergies given you were outside yesterday then this flared up. It also could be an early upper respiratory infection. Trial flonase 2 sparys each nostril for next 7 days   Given your COPD, also did provide prednisone in case this is more of an upper respiratory infection as this can trigger COPD flares. Only use this if shortness of breath increases and start with wheeze. If this does happen use albuterol every 4-6 hours while prednisone kicks in.

## 2015-11-21 NOTE — Progress Notes (Signed)
Garret Reddish, MD  Subjective:  Catherine [name] is a 81 y.o. year old very pleasant female patient who presents for/with See problem oriented charting ROS- no fever, chills. No sputum production. No nausea/vomiting  Past Medical History-  Patient Active Problem List   Diagnosis Date Noted  . Dyspnea 03/26/2015  . Cholecystitis 03/16/2015  . Atrial fibrillation, chronic ([place]) 03/16/2015  . Warfarin-induced coagulopathy ([place]) 03/16/2015  . Valvular heart disease 03/16/2015  . COPD GOLD II  03/16/2015  . Nephrolithiasis 03/16/2015  . Left flank pain   . COPD exacerbation ([place]) 03/11/2015  . Bronchitis, acute 03/11/2015  . Acute respiratory failure with hypoxia ([place]) 03/11/2015  . Hypokalemia   . Essential hypertension   . RHEUMATIC MITRAL STENOSIS 01/05/2008  . RHEUMATIC HEART DISEASE 01/05/2008  . Moderate mitral regurgitation 01/05/2008  . Atrial fibrillation ([place]) 01/05/2008    Medications- reviewed and updated Current Outpatient Prescriptions  Medication Sig Dispense Refill  . atenolol (TENORMIN) 25 MG tablet Take 12.5 mg by mouth daily.    . B Complex-C (B-COMPLEX WITH VITAMIN C) tablet Take 1 tablet by mouth daily.    . budesonide-formoterol (SYMBICORT) 80-4.5 MCG/ACT inhaler Inhale 2 puffs into the lungs 2 (two) times daily.     . furosemide (LASIX) 80 MG tablet Take 40 mg by mouth 2 (two) times daily.    . Multiple Vitamin (MULTIVITAMIN) tablet Take 1 tablet by mouth daily.      . potassium chloride (K-DUR) 10 MEQ tablet Take 50 mEq by mouth once. Take 2 tablets 5 times a day    . triamterene-hydrochlorothiazide (MAXZIDE-25) 37.5-25 MG tablet Take 1 tablet by mouth daily.    Marland Kitchen warfarin (COUMADIN) 2 MG tablet Take as directed by anticoagulation clinic 105 tablet 1  . albuterol (PROVENTIL HFA;VENTOLIN HFA) 108 (90 BASE) MCG/ACT inhaler Inhale 2 puffs into the lungs every 6 (six) hours as needed for wheezing or shortness of breath. (Patient not taking: Reported on 11/21/2015)  1 Inhaler 0  . diazepam (VALIUM) 5 MG tablet TAKE 1/2 TO 1 TABLET ONCE DAILY AS NEEDED (Patient not taking: Reported on 11/21/2015) 30 tablet 0  . fluticasone (FLONASE) 50 MCG/ACT nasal spray Place 2 sprays into both nostrils daily. 16 g 6  . predniSONE (DELTASONE) 20 MG tablet Take 2 pills for 3 days, 1 pill for 4 days 10 tablet 0   No current facility-administered medications for this visit.    Objective: BP 140/82 mmHg  Pulse 90  Temp(Src) 98.7 F (37.1 C)  Wt 153 lb (69.4 kg)  SpO2 97% Gen: NAD, resting comfortably TM both obstructed by cerumen (declines irrigation today). Oropharynx normal. mucous membrane. Clear drainage in both nares CV: RRR no murmurs rubs or gallops Lungs: CTAB no crackles, wheeze, rhonchi Abdomen: soft/nontender/nondistended/normal bowel sounds. No rebound or guarding.  Ext: 1+ edema (has not taken lasix today Skin: warm, dry, no rash Neuro: grossly normal, moves all extremities  Assessment/Plan:  Rhinorrhea/ dry cough/ ears popping S: Patient states she was outdoors for prolonged period for her yesterday. Later in evening noted very runny nose. This morning she feels more congested and continues to have runny nose. She has a mild dry cough. She wonders if she could be dealing with some allergies since she was outdoors. Does not feel particularly ill or short of breath. Also may have had some cold exposures and is worried as in past this has been first before having COPD exacerbation. Denies watery eyes. Ears popping and have some fullness. A/P: allergic  rhinitis vs. Early URI. Treat with flonase. Patients main concern was to make sure lungs were ok and they were clear on exam. Since URi may trigger copd exacerbation and weekend about to start did provide prednisone on paper so she could fill if wheeze/sob and she would call to update [name] on Monday. Continue spiriva and if worsening respiratory symptoms start albuterol every 4-6 hours. I suspect the ear issues are  eustachian tube related- flonase may help as well. She declined irrigation of ears today.   Return precautions advised.   Meds ordered this encounter  Medications  . fluticasone (FLONASE) 50 MCG/ACT nasal spray    Sig: Place 2 sprays into both nostrils daily.    Dispense:  16 g    Refill:  6  . predniSONE (DELTASONE) 20 MG tablet    Sig: Take 2 pills for 3 days, 1 pill for 4 days    Dispense:  10 tablet    Refill:  0

## 2015-11-21 NOTE — Patient Instructions (Signed)
Great job with the changes that you have made (reducing sugar). Be consistent with your green leafy vegetables intake due to the coumadin. Read labels for fat and sodium.  Aim for no more than 2000 mg sodium per day.   No added salt in cooking, rinse canned vegetables, avoid processed meat. Small amounts of protein each time you eat.  (chicken, [place], fish, meat, nuts, 1 ounce cheese, yogurt) Cut back on your use of butter.  Increase your non starchy vegetable intake. Sweet potatoes and dried beans and other vegetables are great sources of potassium. American Heart Association and American Diabetes Association are great resources for recipes [employer] or bookstore)  Keep up your walking!

## 2015-11-24 ENCOUNTER — Telehealth: Payer: Self-pay | Admitting: Family Medicine

## 2015-11-24 NOTE — Telephone Encounter (Signed)
Patient said that she is feeling better. FYI

## 2015-11-24 NOTE — Telephone Encounter (Signed)
Noted  

## 2015-12-08 ENCOUNTER — Ambulatory Visit (INDEPENDENT_AMBULATORY_CARE_PROVIDER_SITE_OTHER): Payer: Medicare Other | Admitting: General Practice

## 2015-12-08 DIAGNOSIS — Z7901 Long term (current) use of anticoagulants: Secondary | ICD-10-CM | POA: Diagnosis not present

## 2015-12-08 DIAGNOSIS — Z5181 Encounter for therapeutic drug level monitoring: Secondary | ICD-10-CM | POA: Diagnosis not present

## 2015-12-08 DIAGNOSIS — I4891 Unspecified atrial fibrillation: Secondary | ICD-10-CM

## 2015-12-08 LAB — POCT INR: INR: 2.1

## 2015-12-08 NOTE — Progress Notes (Signed)
Pre visit review using our clinic review tool, if applicable. No additional management support is needed unless otherwise documented below in the visit note. 

## 2015-12-12 ENCOUNTER — Other Ambulatory Visit: Payer: Self-pay | Admitting: Family Medicine

## 2015-12-12 ENCOUNTER — Other Ambulatory Visit: Payer: Self-pay | Admitting: Cardiology

## 2016-01-05 ENCOUNTER — Ambulatory Visit (INDEPENDENT_AMBULATORY_CARE_PROVIDER_SITE_OTHER): Payer: Medicare Other | Admitting: General Practice

## 2016-01-05 DIAGNOSIS — Z7901 Long term (current) use of anticoagulants: Secondary | ICD-10-CM

## 2016-01-05 DIAGNOSIS — Z5181 Encounter for therapeutic drug level monitoring: Secondary | ICD-10-CM | POA: Diagnosis not present

## 2016-01-05 DIAGNOSIS — I4891 Unspecified atrial fibrillation: Secondary | ICD-10-CM

## 2016-01-05 LAB — POCT INR: INR: 1.9

## 2016-01-05 NOTE — Progress Notes (Signed)
Pre visit review using our clinic review tool, if applicable. No additional management support is needed unless otherwise documented below in the visit note. 

## 2016-01-12 ENCOUNTER — Other Ambulatory Visit: Payer: Self-pay | Admitting: Family Medicine

## 2016-01-20 ENCOUNTER — Ambulatory Visit (INDEPENDENT_AMBULATORY_CARE_PROVIDER_SITE_OTHER): Payer: Medicare Other | Admitting: Family Medicine

## 2016-01-20 VITALS — BP 130/90 | HR 81 | Temp 98.0°F | Ht 64.0 in | Wt 147.0 lb

## 2016-01-20 DIAGNOSIS — H6123 Impacted cerumen, bilateral: Secondary | ICD-10-CM

## 2016-01-20 DIAGNOSIS — R309 Painful micturition, unspecified: Secondary | ICD-10-CM

## 2016-01-20 LAB — POCT URINALYSIS DIPSTICK
Bilirubin, UA: NEGATIVE
Blood, UA: NEGATIVE
GLUCOSE UA: NEGATIVE
Ketones, UA: NEGATIVE
Leukocytes, UA: NEGATIVE
NITRITE UA: NEGATIVE
Protein, UA: NEGATIVE
Spec Grav, UA: 1.015
UROBILINOGEN UA: 0.2
pH, UA: 7

## 2016-01-20 NOTE — Progress Notes (Signed)
Pre visit review using our clinic review tool, if applicable. No additional management support is needed unless otherwise documented below in the visit note. 

## 2016-01-20 NOTE — Progress Notes (Signed)
Subjective:    Patient ID: Catherine [name], female    DOB: [birth year], 81 y.o.   MRN: [id number]  HPI Patient seen for the following:  1 week history of decreased hearing left ear. Feels a fullness and occasional "popping" sensation. No real ear pain. No vertigo. No ringing. She was seen for rhinitis symptoms back in February and noted cerumen impactions at that time but patient declined irrigation.    She complains of some mild periurethral discomfort at the end of urination. No fevers or chills. No urine frequency. She's had previous total abdominal hysterectomy.  Past Medical History  Diagnosis Date  . Hypertension   . Atrial fibrillation, chronic ([place])   . Heart murmur   . Arthritis   . UTI (urinary tract infection)   . Valvular heart disease     Has mitral stenosis with prior mitral commissurotomy in 1970  . H/O: rheumatic fever   . Chronic anticoagulation   . Asthma     last attack 02/2015  . PONV (postoperative nausea and vomiting)     ' SOMETIMES', BUT NOT ALWAYS"  . Chronic kidney disease     "RIGHT MANY KIDNEY INFECTIONS AND STONES"  . COPD (chronic obstructive pulmonary disease) [facility])    Past Surgical History  Procedure Laterality Date  . Cardiac catheterization    . Back surgery      neurosurgery  . Commissurotomy  1970  . Other surgical history      hysterectomy  . Cataract surg    . Abdominal hysterectomy  1983    endometriosis  . Eye surgery    . Cholecystectomy N/A 05/30/2015    Procedure: LAPAROSCOPIC CHOLECYSTECTOMY WITH INTRAOPERATIVE CHOLANGIOGRAM;  Surgeon: Excell Seltzer, MD;  Location: [place];  Service: General;  Laterality: N/A;    reports that she quit smoking about 40 years ago. Her smoking use included Cigarettes. She has a 15 pack-year smoking history. She has never used smokeless tobacco. She reports that she does not drink alcohol or use illicit drugs. family history includes Leukemia in her father. Allergies  Allergen Reactions  .  Aldactone [Spironolactone] Other (See Comments)    dyspnea  . Amoxicillin Palpitations    tachycardia  . Diltiazem Other (See Comments)    Causing headaches   . Flagyl [Metronidazole Hcl] Other (See Comments)    Causing headaches    . Flovent [Fluticasone Propionate] Other (See Comments)    Leg cramps  . Gabapentin Swelling  . Lyrica [Pregabalin] Swelling  . Quinidine Diarrhea and Other (See Comments)    Fever diarrhea  . Simvastatin Other (See Comments)    Leg pain, myalgia  . Tramadol Nausea Only  . Verapamil Other (See Comments)    myalgias  . Ace Inhibitors Other (See Comments)    unknown  . Benazepril Hcl Cough  . Ciprocin-Fluocin-Procin [Fluocinolone Acetonide] Other (See Comments)    unknown  . Ciprofloxacin Diarrhea  . Codeine Nausea Only  . Nitrofurantoin Monohyd Macro Nausea Only      Review of Systems  Constitutional: Negative for fever and chills.  HENT: Positive for hearing loss (Left ear as per history of present illness). Negative for ear discharge and ear pain.   Gastrointestinal: Negative for abdominal pain and constipation.  Genitourinary: Positive for dysuria. Negative for hematuria, decreased urine volume and difficulty urinating.  Neurological: Negative for dizziness and headaches.       Objective:   Physical Exam  Constitutional: She appears well-developed and well-nourished.  HENT:  Mouth/Throat:  Oropharynx is clear and moist.  Bilateral cerumen impactions.  Neck: Neck supple. No thyromegaly present.  Cardiovascular: Normal rate and regular rhythm.   Pulmonary/Chest: Effort normal and breath sounds normal. No respiratory distress. She has no wheezes. She has no rales.  Musculoskeletal: She exhibits no edema.          Assessment & Plan:  #1 bilateral cerumen impactions. Recommend irrigation and patient consents.  #2 mild dysuria. Urine dipstick completely normal. Question atrophic vaginitis. She does not describe any obstructive  urinary symptoms. Discussed possible topical estrogen but this point she declines.  [name] MD [facility] at [facility]

## 2016-02-02 ENCOUNTER — Ambulatory Visit (INDEPENDENT_AMBULATORY_CARE_PROVIDER_SITE_OTHER): Payer: Medicare Other | Admitting: General Practice

## 2016-02-02 ENCOUNTER — Telehealth: Payer: Self-pay | Admitting: Family Medicine

## 2016-02-02 DIAGNOSIS — Z5181 Encounter for therapeutic drug level monitoring: Secondary | ICD-10-CM | POA: Diagnosis not present

## 2016-02-02 DIAGNOSIS — Z7901 Long term (current) use of anticoagulants: Secondary | ICD-10-CM

## 2016-02-02 DIAGNOSIS — I4891 Unspecified atrial fibrillation: Secondary | ICD-10-CM | POA: Diagnosis not present

## 2016-02-02 LAB — POCT INR: INR: 3.4

## 2016-02-02 NOTE — Telephone Encounter (Signed)
Please advise if okay to refer this pt. Would this be a GYN appt or is urology appropriate.

## 2016-02-02 NOTE — Telephone Encounter (Signed)
Pt has a pending appt tomorrow.

## 2016-02-02 NOTE — Progress Notes (Signed)
Pre visit review using our clinic review tool, if applicable. No additional management support is needed unless otherwise documented below in the visit note. 

## 2016-02-02 NOTE — Telephone Encounter (Signed)
Pt would like to have a referral to a Urologist b/c of the pain after urinating.  Pt stated that she came in 4/25 and the lab work did not show any kind of an infection.

## 2016-02-02 NOTE — Telephone Encounter (Signed)
Don't think Urology would be very helpful- unless she is describing obstructive symptoms.  Suspect she has some atrophic vaginitis.  GYN refer vs follow up here to discuss pros and cons of topical estrogen trial.

## 2016-02-03 ENCOUNTER — Ambulatory Visit (INDEPENDENT_AMBULATORY_CARE_PROVIDER_SITE_OTHER): Payer: Medicare Other | Admitting: Family Medicine

## 2016-02-03 VITALS — BP 140/86 | HR 86 | Temp 98.5°F | Ht 64.0 in | Wt 150.0 lb

## 2016-02-03 DIAGNOSIS — N952 Postmenopausal atrophic vaginitis: Secondary | ICD-10-CM | POA: Diagnosis not present

## 2016-02-03 MED ORDER — ESTRADIOL 0.1 MG/GM VA CREA: 1.0000 | TOPICAL_CREAM | VAGINAL | Status: DC

## 2016-02-03 MED ORDER — BUDESONIDE-FORMOTEROL FUMARATE 80-4.5 MCG/ACT IN AERO: 2.0000 | INHALATION_SPRAY | Freq: Two times a day (BID) | RESPIRATORY_TRACT | Status: DC

## 2016-02-03 NOTE — Progress Notes (Signed)
Pre visit review using our clinic review tool, if applicable. No additional management support is needed unless otherwise documented below in the visit note. 

## 2016-02-03 NOTE — Patient Instructions (Signed)

## 2016-02-03 NOTE — Progress Notes (Signed)
Subjective:    Patient ID: Catherine [name], female    DOB: [birth year], 81 y.o.   MRN: [id number]  HPI  Patient has had recurrent dysuria.  Recently was seen had unremarkable dipstick. She frequently has pain at the end of urination. No obstructive urinary symptoms. No gross hematuria. No fevers or chills.  She had total abdominal hysterectomy 1983.  CT abdomen pelvis per GI back in January 2017 with no acute findings.  She is no longer sexually active.  She has occasional vaginal drying though for the most part not bothersome.  Past Medical History  Diagnosis Date  . Hypertension   . Atrial fibrillation, chronic ([place])   . Heart murmur   . Arthritis   . UTI (urinary tract infection)   . Valvular heart disease     Has mitral stenosis with prior mitral commissurotomy in 1970  . H/O: rheumatic fever   . Chronic anticoagulation   . Asthma     last attack 02/2015  . PONV (postoperative nausea and vomiting)     ' SOMETIMES', BUT NOT ALWAYS"  . Chronic kidney disease     "RIGHT MANY KIDNEY INFECTIONS AND STONES"  . COPD (chronic obstructive pulmonary disease) [facility])    Past Surgical History  Procedure Laterality Date  . Cardiac catheterization    . Back surgery      neurosurgery  . Commissurotomy  1970  . Other surgical history      hysterectomy  . Cataract surg    . Abdominal hysterectomy  1983    endometriosis  . Eye surgery    . Cholecystectomy N/A 05/30/2015    Procedure: LAPAROSCOPIC CHOLECYSTECTOMY WITH INTRAOPERATIVE CHOLANGIOGRAM;  Surgeon: Excell Seltzer, MD;  Location: [place];  Service: General;  Laterality: N/A;    reports that she quit smoking about 40 years ago. Her smoking use included Cigarettes. She has a 15 pack-year smoking history. She has never used smokeless tobacco. She reports that she does not drink alcohol or use illicit drugs. family history includes Leukemia in her father. Allergies  Allergen Reactions  . Aldactone [Spironolactone] Other (See  Comments)    dyspnea  . Amoxicillin Palpitations    tachycardia  . Diltiazem Other (See Comments)    Causing headaches   . Flagyl [Metronidazole Hcl] Other (See Comments)    Causing headaches    . Flovent [Fluticasone Propionate] Other (See Comments)    Leg cramps  . Gabapentin Swelling  . Lyrica [Pregabalin] Swelling  . Quinidine Diarrhea and Other (See Comments)    Fever diarrhea  . Simvastatin Other (See Comments)    Leg pain, myalgia  . Tramadol Nausea Only  . Verapamil Other (See Comments)    myalgias  . Ace Inhibitors Other (See Comments)    unknown  . Benazepril Hcl Cough  . Ciprocin-Fluocin-Procin [Fluocinolone Acetonide] Other (See Comments)    unknown  . Ciprofloxacin Diarrhea  . Codeine Nausea Only  . Nitrofurantoin Monohyd Macro Nausea Only      Review of Systems  Genitourinary: Positive for frequency and vaginal pain. Negative for urgency, hematuria, flank pain, decreased urine volume, vaginal bleeding and genital sores.       Objective:   Physical Exam  Constitutional: She appears well-developed and well-nourished.  Cardiovascular: Normal rate and regular rhythm.   Pulmonary/Chest: Effort normal and breath sounds normal. No respiratory distress. She has no wheezes. She has no rales. She exhibits no tenderness.  Genitourinary:  Normal external genitalia. Vaginal mucosa appears slightly atrophic. No  skin lesions noted. No visible discharge          Assessment & Plan:   Recurrent dysuria. Recent urine dipstick unremarkable. Suspect that she has component of atrophic vaginitis contributing to recurrent urinary symptoms. We discussed pros and cons of trial of estrogen topical cream. We'll try Estrace  Cream 3 times weekly and reassess in one month.   She has no history of breast cancer and gets regular mammograms.  [name] MD [facility] at [facility]

## 2016-02-06 ENCOUNTER — Telehealth: Payer: Self-pay | Admitting: Family Medicine

## 2016-02-06 MED ORDER — FLUCONAZOLE 150 MG PO TABS
150.0000 mg | ORAL_TABLET | Freq: Once | ORAL | Status: DC
Start: 2016-02-06 — End: 2016-02-09

## 2016-02-06 NOTE — Telephone Encounter (Signed)
Pt saw Dr Elease Hashimoto 5/9 and prescribed estradiol (ESTRACE VAGINAL) 0.1 MG/GM vaginal cream Pt states her symptoms are worse, it hurts all the way up inside. Pt states she can hardly sit down it burns so bad. Pt concerned she may be allergic to this. Would like to know what Dr would have her do?  [facility]/ [place]

## 2016-02-06 NOTE — Telephone Encounter (Signed)
Hold Estrace for now.  If still burning by Monday follow up to reassess.

## 2016-02-06 NOTE — Telephone Encounter (Signed)
Fluconazole 150mg  1qd sent into the pharmacy. [name] with Dr. Elease Hashimoto on Monday 02/09/16.

## 2016-02-06 NOTE — Telephone Encounter (Signed)
Please advise.  Should we try to get her in with GYN asap?

## 2016-02-09 ENCOUNTER — Other Ambulatory Visit: Payer: Self-pay | Admitting: Family Medicine

## 2016-02-09 ENCOUNTER — Ambulatory Visit (INDEPENDENT_AMBULATORY_CARE_PROVIDER_SITE_OTHER): Payer: Medicare Other | Admitting: Family Medicine

## 2016-02-09 VITALS — BP 140/82 | HR 78 | Temp 98.3°F | Ht 64.0 in | Wt 150.4 lb

## 2016-02-09 DIAGNOSIS — R300 Dysuria: Secondary | ICD-10-CM

## 2016-02-09 DIAGNOSIS — T50905A Adverse effect of unspecified drugs, medicaments and biological substances, initial encounter: Secondary | ICD-10-CM

## 2016-02-09 DIAGNOSIS — T887XXA Unspecified adverse effect of drug or medicament, initial encounter: Secondary | ICD-10-CM

## 2016-02-09 LAB — POCT URINALYSIS DIPSTICK
Bilirubin, UA: NEGATIVE
Blood, UA: NEGATIVE
GLUCOSE UA: NEGATIVE
Ketones, UA: NEGATIVE
NITRITE UA: POSITIVE
PROTEIN UA: NEGATIVE
SPEC GRAV UA: 1.015
UROBILINOGEN UA: 0.2
pH, UA: 7.5

## 2016-02-09 MED ORDER — FLUCONAZOLE 150 MG PO TABS: 150.0000 mg | ORAL_TABLET | Freq: Once | ORAL | Status: DC

## 2016-02-09 MED ORDER — LEVOFLOXACIN 500 MG PO TABS: 500.0000 mg | ORAL_TABLET | Freq: Every day | ORAL | Status: DC

## 2016-02-09 NOTE — Progress Notes (Signed)
Subjective:    Patient ID: Catherine [name], female    DOB: 01/17/1935, 81 y.o.   MRN: [id number]  HPI  Patient is a long history of frequent recurrent dysuria.  We suspected some atrophic vaginitis and recently prescribed Estrace vaginal cream. After taking this just a couple days she had some irritation intra- vaginally. She stopped after 3 days and has had slow improvement since then. She's had some recurrence of urine frequency and mild burning with urination.   Multiple antibiotic intolerances. She has taken Levaquin without difficulty but not many other options. Recently taking Azo-Standard with some improvement. No fevers or chills.  Past Medical History  Diagnosis Date  . Hypertension   . Atrial fibrillation, chronic ([place])   . Heart murmur   . Arthritis   . UTI (urinary tract infection)   . Valvular heart disease     Has mitral stenosis with prior mitral commissurotomy in 1970  . H/O: rheumatic fever   . Chronic anticoagulation   . Asthma     last attack 02/2015  . PONV (postoperative nausea and vomiting)     ' SOMETIMES', BUT NOT ALWAYS"  . Chronic kidney disease     "RIGHT MANY KIDNEY INFECTIONS AND STONES"  . COPD (chronic obstructive pulmonary disease) [facility])    Past Surgical History  Procedure Laterality Date  . Cardiac catheterization    . Back surgery      neurosurgery  . Commissurotomy  1970  . Other surgical history      hysterectomy  . Cataract surg    . Abdominal hysterectomy  1983    endometriosis  . Eye surgery    . Cholecystectomy N/A 05/30/2015    Procedure: LAPAROSCOPIC CHOLECYSTECTOMY WITH INTRAOPERATIVE CHOLANGIOGRAM;  Surgeon: Excell Seltzer, MD;  Location: [place];  Service: General;  Laterality: N/A;    reports that she quit smoking about 40 years ago. Her smoking use included Cigarettes. She has a 15 pack-year smoking history. She has never used smokeless tobacco. She reports that she does not drink alcohol or use illicit drugs. family history  includes Leukemia in her father. Allergies  Allergen Reactions  . Aldactone [Spironolactone] Other (See Comments)    dyspnea  . Amoxicillin Palpitations    tachycardia  . Diltiazem Other (See Comments)    Causing headaches   . Flagyl [Metronidazole Hcl] Other (See Comments)    Causing headaches    . Flovent [Fluticasone Propionate] Other (See Comments)    Leg cramps  . Gabapentin Swelling  . Lyrica [Pregabalin] Swelling  . Quinidine Diarrhea and Other (See Comments)    Fever diarrhea  . Simvastatin Other (See Comments)    Leg pain, myalgia  . Tramadol Nausea Only  . Verapamil Other (See Comments)    myalgias  . Ace Inhibitors Other (See Comments)    unknown  . Benazepril Hcl Cough  . Ciprocin-Fluocin-Procin [Fluocinolone Acetonide] Other (See Comments)    unknown  . Ciprofloxacin Diarrhea  . Codeine Nausea Only  . Nitrofurantoin Monohyd Macro Nausea Only      Review of Systems  Constitutional: Negative for fever and chills.  Respiratory: Negative for shortness of breath.   Cardiovascular: Negative for chest pain.  Genitourinary: Positive for dysuria and frequency. Negative for hematuria, flank pain and vaginal discharge.       Objective:   Physical Exam  Constitutional: She appears well-developed and well-nourished.  Cardiovascular: Normal rate and regular rhythm.   Pulmonary/Chest: Effort normal and breath sounds normal. No respiratory  distress. She has no wheezes. She has no rales.          Assessment & Plan:   Recurrent dysuria. Suspected atrophic vaginitis. Recent intolerance with Estrace vaginal cream. Urine dipstick today nonspecific findings. Urine culture sent. We've recommended holding antibiotics at this point until culture back unless she develops any fever or worsening symptoms.  [name] MD [facility] at [facility]

## 2016-02-09 NOTE — Patient Instructions (Signed)
We will call you with urine culture results. Call for any fever or other concerns.

## 2016-02-09 NOTE — Progress Notes (Signed)
Pre visit review using our clinic review tool, if applicable. No additional management support is needed unless otherwise documented below in the visit note. 

## 2016-02-09 NOTE — Telephone Encounter (Signed)
Due on 02/11/16.

## 2016-02-11 LAB — URINE CULTURE: Colony Count: >=100000

## 2016-02-27 ENCOUNTER — Other Ambulatory Visit: Payer: Self-pay | Admitting: Family Medicine

## 2016-02-27 DIAGNOSIS — Z1231 Encounter for screening mammogram for malignant neoplasm of breast: Secondary | ICD-10-CM

## 2016-03-01 ENCOUNTER — Ambulatory Visit (INDEPENDENT_AMBULATORY_CARE_PROVIDER_SITE_OTHER): Payer: Medicare Other | Admitting: Family Medicine

## 2016-03-01 ENCOUNTER — Ambulatory Visit (INDEPENDENT_AMBULATORY_CARE_PROVIDER_SITE_OTHER): Payer: Medicare Other | Admitting: General Practice

## 2016-03-01 VITALS — BP 132/82 | HR 81 | Temp 98.3°F | Ht 64.0 in | Wt 152.8 lb

## 2016-03-01 DIAGNOSIS — Z7901 Long term (current) use of anticoagulants: Secondary | ICD-10-CM

## 2016-03-01 DIAGNOSIS — I4891 Unspecified atrial fibrillation: Secondary | ICD-10-CM

## 2016-03-01 DIAGNOSIS — R600 Localized edema: Secondary | ICD-10-CM

## 2016-03-01 DIAGNOSIS — N390 Urinary tract infection, site not specified: Secondary | ICD-10-CM | POA: Diagnosis not present

## 2016-03-01 DIAGNOSIS — Z5181 Encounter for therapeutic drug level monitoring: Secondary | ICD-10-CM

## 2016-03-01 LAB — POCT INR: INR: 4.3

## 2016-03-01 NOTE — Patient Instructions (Signed)

## 2016-03-01 NOTE — Progress Notes (Signed)
Pre visit review using our clinic review tool, if applicable. No additional management support is needed unless otherwise documented below in the visit note. 

## 2016-03-01 NOTE — Progress Notes (Signed)
Subjective:    Patient ID: Catherine [name], female    DOB: 26-Oct-1934, 81 y.o.   MRN: [id number]  HPI Patient here to discuss recurrent UTIs. Most recently had positive culture for Klebsiella. Generally stays well-hydrated. Multiple antibiotic allergies. Recent UTI symptoms fully cleared after taking Levaquin. No gross hematuria. Recent dipstick was positive for nitrates and leukocytes but not blood.  She has history of some chronic lower extremity edema. She recently scaled back Lasix herself to 40 mg daily and has had a couple pounds of weight gain and increased peripheral edema. No dyspnea. She has chronic atrial fibrillation and is on Coumadin  Past Medical History  Diagnosis Date  . Hypertension   . Atrial fibrillation, chronic ([place])   . Heart murmur   . Arthritis   . UTI (urinary tract infection)   . Valvular heart disease     Has mitral stenosis with prior mitral commissurotomy in 1970  . H/O: rheumatic fever   . Chronic anticoagulation   . Asthma     last attack 02/2015  . PONV (postoperative nausea and vomiting)     ' SOMETIMES', BUT NOT ALWAYS"  . Chronic kidney disease     "RIGHT MANY KIDNEY INFECTIONS AND STONES"  . COPD (chronic obstructive pulmonary disease) [facility])    Past Surgical History  Procedure Laterality Date  . Cardiac catheterization    . Back surgery      neurosurgery  . Commissurotomy  1970  . Other surgical history      hysterectomy  . Cataract surg    . Abdominal hysterectomy  1983    endometriosis  . Eye surgery    . Cholecystectomy N/A 05/30/2015    Procedure: LAPAROSCOPIC CHOLECYSTECTOMY WITH INTRAOPERATIVE CHOLANGIOGRAM;  Surgeon: Excell Seltzer, MD;  Location: [place];  Service: General;  Laterality: N/A;    reports that she quit smoking about 40 years ago. Her smoking use included Cigarettes. She has a 15 pack-year smoking history. She has never used smokeless tobacco. She reports that she does not drink alcohol or use illicit  drugs. family history includes Leukemia in her father. Allergies  Allergen Reactions  . Aldactone [Spironolactone] Other (See Comments)    dyspnea  . Amoxicillin Palpitations    tachycardia  . Diltiazem Other (See Comments)    Causing headaches   . Flagyl [Metronidazole Hcl] Other (See Comments)    Causing headaches    . Flovent [Fluticasone Propionate] Other (See Comments)    Leg cramps  . Gabapentin Swelling  . Lyrica [Pregabalin] Swelling  . Quinidine Diarrhea and Other (See Comments)    Fever diarrhea  . Simvastatin Other (See Comments)    Leg pain, myalgia  . Tramadol Nausea Only  . Verapamil Other (See Comments)    myalgias  . Ace Inhibitors Other (See Comments)    unknown  . Benazepril Hcl Cough  . Ciprocin-Fluocin-Procin [Fluocinolone Acetonide] Other (See Comments)    unknown  . Ciprofloxacin Diarrhea  . Codeine Nausea Only  . Nitrofurantoin Monohyd Macro Nausea Only      Review of Systems  Constitutional: Negative for fatigue.  Eyes: Negative for visual disturbance.  Respiratory: Negative for cough, chest tightness, shortness of breath and wheezing.   Cardiovascular: Positive for leg swelling. Negative for chest pain and palpitations.  Genitourinary: Negative for dysuria.  Neurological: Negative for dizziness, seizures, syncope, weakness, light-headedness and headaches.       Objective:   Physical Exam  Constitutional: She appears well-developed and well-nourished.  Cardiovascular:  Normal rate.   Pulmonary/Chest: Effort normal and breath sounds normal. No respiratory distress. She has no wheezes. She has no rales.  Musculoskeletal: She exhibits edema.  Patient has trace to 1+ pitting edema lower legs bilaterally          Assessment & Plan:  #1 recent UTI. She's asymptomatic at this time. We discussed measures to reduce risk of recurrent UTI. Stay well-hydrated. We have recommended against prophylactic antibiotics this time especially with very  limited options with her multiple drug allergies.  #2 leg edema. Chronic with recent exacerbation after she reduced lasix to 40 mg once daily.. Increase Lasix back to 40 mg twice a day. Monitor daily weights.  [name] MD [facility] at [facility]

## 2016-03-12 ENCOUNTER — Ambulatory Visit
Admission: RE | Admit: 2016-03-12 | Discharge: 2016-03-12 | Disposition: A | Discharge status: Home / Self Care | Payer: Medicare Other | Source: Ambulatory Visit | Attending: Family Medicine | Admitting: Family Medicine

## 2016-03-12 ENCOUNTER — Other Ambulatory Visit: Payer: Self-pay | Admitting: Family Medicine

## 2016-03-12 DIAGNOSIS — Z1231 Encounter for screening mammogram for malignant neoplasm of breast: Secondary | ICD-10-CM | POA: Diagnosis not present

## 2016-03-12 NOTE — Telephone Encounter (Signed)
Last refill 02/11/16, #30 with 0 refills... Okay to refill?

## 2016-03-14 NOTE — Telephone Encounter (Signed)
Verify if she has been taking this daily.  Refill Ok.

## 2016-03-22 ENCOUNTER — Ambulatory Visit (INDEPENDENT_AMBULATORY_CARE_PROVIDER_SITE_OTHER): Payer: Medicare Other | Admitting: General Practice

## 2016-03-22 ENCOUNTER — Telehealth: Payer: Self-pay | Admitting: Family Medicine

## 2016-03-22 DIAGNOSIS — I4891 Unspecified atrial fibrillation: Secondary | ICD-10-CM | POA: Diagnosis not present

## 2016-03-22 LAB — POCT INR: INR: 3.2

## 2016-03-22 NOTE — Progress Notes (Signed)
Pre visit review using our clinic review tool, if applicable. No additional management support is needed unless otherwise documented below in the visit note. 

## 2016-03-22 NOTE — Telephone Encounter (Signed)
Refill request for Potassium 10 meq take 2 po five times a day and a 90 day supply to [facility].

## 2016-03-23 MED ORDER — POTASSIUM CHLORIDE ER 10 MEQ PO TBCR: 50.0000 meq | EXTENDED_RELEASE_TABLET | Freq: Once | ORAL | Status: DC

## 2016-03-23 NOTE — Addendum Note (Signed)
Addended by: Elio Forget on: 03/23/2016 08:44 AM   Modules accepted: Orders

## 2016-03-23 NOTE — Telephone Encounter (Signed)
Medication refilled for pt. I have scheduled her a follow up in August.

## 2016-04-19 ENCOUNTER — Ambulatory Visit (INDEPENDENT_AMBULATORY_CARE_PROVIDER_SITE_OTHER): Payer: Medicare Other | Admitting: General Practice

## 2016-04-19 DIAGNOSIS — Z7901 Long term (current) use of anticoagulants: Secondary | ICD-10-CM

## 2016-04-19 DIAGNOSIS — I4891 Unspecified atrial fibrillation: Secondary | ICD-10-CM | POA: Diagnosis not present

## 2016-04-19 DIAGNOSIS — Z5181 Encounter for therapeutic drug level monitoring: Secondary | ICD-10-CM

## 2016-04-19 LAB — POCT INR: INR: 2.5

## 2016-04-20 ENCOUNTER — Other Ambulatory Visit: Payer: Self-pay | Admitting: Family Medicine

## 2016-04-21 ENCOUNTER — Ambulatory Visit: Payer: Medicare Other | Admitting: Cardiovascular Disease

## 2016-04-27 ENCOUNTER — Other Ambulatory Visit: Payer: Self-pay | Admitting: Family Medicine

## 2016-05-06 ENCOUNTER — Other Ambulatory Visit: Payer: Self-pay | Admitting: Family Medicine

## 2016-05-06 MED ORDER — ATENOLOL 25 MG PO TABS: 12.5000 mg | ORAL_TABLET | Freq: Every day | ORAL | 2 refills | Status: DC

## 2016-05-10 ENCOUNTER — Other Ambulatory Visit: Payer: Self-pay | Admitting: Family Medicine

## 2016-05-21 ENCOUNTER — Encounter: Payer: Self-pay | Admitting: Cardiovascular Disease

## 2016-05-21 ENCOUNTER — Ambulatory Visit (INDEPENDENT_AMBULATORY_CARE_PROVIDER_SITE_OTHER): Payer: Medicare Other | Admitting: Cardiovascular Disease

## 2016-05-21 VITALS — BP 134/73 | HR 60 | Ht 64.0 in | Wt 149.2 lb

## 2016-05-21 DIAGNOSIS — I119 Hypertensive heart disease without heart failure: Secondary | ICD-10-CM

## 2016-05-21 DIAGNOSIS — R0602 Shortness of breath: Secondary | ICD-10-CM | POA: Diagnosis not present

## 2016-05-21 DIAGNOSIS — I340 Nonrheumatic mitral (valve) insufficiency: Secondary | ICD-10-CM

## 2016-05-21 LAB — CBC WITH DIFFERENTIAL/PLATELET
BASOS ABS: 75 {cells}/uL (ref 0–200)
BASOS PCT: 1 %
EOS ABS: 150 {cells}/uL (ref 15–500)
Eosinophils Relative: 2 %
HEMATOCRIT: 42.1 % (ref 35.0–45.0)
Hemoglobin: 14.5 g/dL (ref 11.7–15.5)
LYMPHS PCT: 29 %
Lymphs Abs: 2175 cells/uL (ref 850–3900)
MCH: 30.8 pg (ref 27.0–33.0)
MCHC: 34.4 g/dL (ref 32.0–36.0)
MCV: 89.4 fL (ref 80.0–100.0)
MONO ABS: 600 {cells}/uL (ref 200–950)
MONOS PCT: 8 %
MPV: 8.8 fL (ref 7.5–12.5)
Neutro Abs: 4500 cells/uL (ref 1500–7800)
Neutrophils Relative %: 60 %
Platelets: 241 10*3/uL (ref 140–400)
RBC: 4.71 MIL/uL (ref 3.80–5.10)
RDW: 13.6 % (ref 11.0–15.0)
WBC: 7.5 10*3/uL (ref 3.8–10.8)

## 2016-05-21 LAB — COMPREHENSIVE METABOLIC PANEL
ALT: 21 U/L (ref 6–29)
AST: 28 U/L (ref 10–35)
Albumin: 4.7 g/dL (ref 3.6–5.1)
Alkaline Phosphatase: 65 U/L (ref 33–130)
BILIRUBIN TOTAL: 1 mg/dL (ref 0.2–1.2)
BUN: 16 mg/dL (ref 7–25)
CHLORIDE: 100 mmol/L (ref 98–110)
CO2: 30 mmol/L (ref 20–31)
CREATININE: 0.8 mg/dL (ref 0.60–0.93)
Calcium: 9.9 mg/dL (ref 8.6–10.4)
Glucose, Bld: 96 mg/dL (ref 65–99)
Potassium: 3.4 mmol/L — ABNORMAL LOW (ref 3.5–5.3)
SODIUM: 146 mmol/L (ref 135–146)
TOTAL PROTEIN: 7.1 g/dL (ref 6.1–8.1)

## 2016-05-21 LAB — LIPID PANEL
CHOL/HDL RATIO: 3.7 ratio (ref <=5.0)
Cholesterol: 260 mg/dL — ABNORMAL HIGH (ref 125–200)
HDL: 71 mg/dL (ref >=46)
LDL CALC: 157 mg/dL — AB (ref <130)
Triglycerides: 161 mg/dL — ABNORMAL HIGH (ref <150)
VLDL: 32 mg/dL — ABNORMAL HIGH (ref <30)

## 2016-05-21 NOTE — Progress Notes (Signed)
Cardiology Office Note   Date:  05/21/2016   ID:  Catherine [name], DOB August 06, 1935, MRN [id number]  PCP:  [name], MD  Cardiologist:   [name], MD   Chief Complaint  Patient presents with  . New Patient (Initial Visit)    former pt of [facility]. sob; frequently, when doing any activity. lighthead; when getting up and down to o fast. cramping in legs at night. edema; in feet occasionally.      History of Present Illness: Catherine [name] is a 81 y.o. female with chronic atrial fibrillation, hypertension, moderate mitral regurgitation, rheumatic mitral valve disease, and COPD who presents for follow up. Catherine [name] was previously a patient of [name]. She underwent mitral commissurotomy in the 1970s. On her echocardiogram 02/2014 she had an ejection fraction of 60-65% with moderate to severe mitral regurgitation. There was also mild aortic regurgitation. Her follow-up echo 02/2015 showed an EF of 60-65% with moderate to severe mitral regurgitation, moderate tricuspid regurgitation and elevated pulmonary artery pressures.  Catherine [name] reports that for the last year her energy levels have been very poor. She also feels as though her atrial fibrillation has not been as well-controlled, and she notes more frequent episodes of her heart racing. Her breathing has been much worse lately. She likes to walk on the treadmill for half a mile 5 days per week. Lately she's been unable to do this. She is able to walk on the treadmill she notes that she reduces this beat. Last year she walked at a 3.5 mile per hour pace. Now she ranges from 2.2-2.5 miles per hour due to shortness of breath with exertion. She has not noted any chest pain or pressure.  Her lower extremity edema is well-controlled. However, if she misses a dose of Lasix she does note significant edema.  Catherine [name] reports increased cramping in bilateral legs at night. 2 months ago her potassium supplementation was reduced to  50 mEq daily instead of 100 mEq. When she gets the cramping she tries to eat a teaspoon of mustard and this does seem to help.   Past Medical History:  Diagnosis Date  . Arthritis   . Asthma    last attack 02/2015  . Atrial fibrillation, chronic ([place])   . Chronic anticoagulation   . Chronic kidney disease    "RIGHT MANY KIDNEY INFECTIONS AND STONES"  . COPD (chronic obstructive pulmonary disease) ([place])   . H/O: rheumatic fever   . Heart murmur   . Hypertension   . PONV (postoperative nausea and vomiting)    ' SOMETIMES', BUT NOT ALWAYS"  . UTI (urinary tract infection)   . Valvular heart disease    Has mitral stenosis with prior mitral commissurotomy in 1970    Past Surgical History:  Procedure Laterality Date  . ABDOMINAL HYSTERECTOMY  1983   endometriosis  . BACK SURGERY     neurosurgery  . CARDIAC CATHETERIZATION    . cataract surg    . CHOLECYSTECTOMY N/A 05/30/2015   Procedure: LAPAROSCOPIC CHOLECYSTECTOMY WITH INTRAOPERATIVE CHOLANGIOGRAM;  Surgeon: Excell Seltzer, MD;  Location: [place];  Service: General;  Laterality: N/A;  . commissurotomy  1970  . EYE SURGERY    . OTHER SURGICAL HISTORY     hysterectomy     Current Outpatient Prescriptions  Medication Sig Dispense Refill  . atenolol (TENORMIN) 25 MG tablet Take 0.5 tablets (12.5 mg total) by mouth daily. 45 tablet 2  . B Complex-C (B-COMPLEX WITH VITAMIN C)  tablet Take 1 tablet by mouth daily.    . budesonide-formoterol (SYMBICORT) 80-4.5 MCG/ACT inhaler Inhale 2 puffs into the lungs 2 (two) times daily. 1 Inhaler 11  . diazepam (VALIUM) 5 MG tablet TAKE 1/2 TO 1 TABLET ONCE DAILY AS NEEDED (USE SPARINGLY) 30 tablet 0  . furosemide (LASIX) 80 MG tablet Take 40 mg by mouth 2 (two) times daily.    . Multiple Vitamin (MULTIVITAMIN) tablet Take 1 tablet by mouth daily.      . potassium chloride (K-DUR) 10 MEQ tablet Take 5 tablets (50 mEq total) by mouth once. 900 tablet 1  . PROAIR HFA 108 (90 Base) MCG/ACT  inhaler 2 puffs every 6 hours as needed for wheezi ng or shortness of breath. 8.5 g 2  . triamterene-hydrochlorothiazide (MAXZIDE-25) 37.5-25 MG tablet Take 1 tablet by mouth daily.    Marland Kitchen warfarin (COUMADIN) 2 MG tablet Take as directed by anticoagulation clinic (Patient taking differently: take 2 mg 5 days 2.5 mg 2 days) 105 tablet 1   No current facility-administered medications for this visit.     Allergies:   Aldactone [spironolactone]; Amoxicillin; Diltiazem; Flagyl [metronidazole hcl]; Flovent [fluticasone propionate]; Gabapentin; Lyrica [pregabalin]; Quinidine; Simvastatin; Tramadol; Verapamil; Ace inhibitors; Benazepril hcl; Ciprocin-fluocin-procin [fluocinolone acetonide]; Ciprofloxacin; Codeine; and Nitrofurantoin monohyd macro    Social History:  The patient  reports that she quit smoking about 40 years ago. Her smoking use included Cigarettes. She has a 15.00 pack-year smoking history. She has never used smokeless tobacco. She reports that she does not drink alcohol or use drugs.   Family History:  The patient's family history includes Leukemia in her father.    ROS:  Please see the history of present illness.   Otherwise, review of systems are positive for none.   All other systems are reviewed and negative.    PHYSICAL EXAM: VS:  BP 134/73   Pulse 60   Ht 5\' 4"  (1.626 m)   Wt 149 lb 3.2 oz (67.7 kg)   BMI 25.61 kg/m  , BMI Body mass index is 25.61 kg/m. GENERAL:  Well appearing HEENT:  Pupils equal round and reactive, fundi not visualized, oral mucosa unremarkable NECK:  No jugular venous distention, waveform within normal limits, carotid upstroke brisk and symmetric, no bruits, no thyromegaly LYMPHATICS:  No cervical adenopathy LUNGS:  Clear to auscultation bilaterally HEART:  RRR.  PMI not displaced or sustained,S1 and S2 within normal limits, no S3, no S4, no clicks, no rubs, II/VI systolic murmur at the RUSB and III/VI holosystolic murmur at the apex. ABD:  Flat,  positive bowel sounds normal in frequency in pitch, no bruits, no rebound, no guarding, no midline pulsatile mass, no hepatomegaly, no splenomegaly EXT:  2 plus pulses throughout, no edema, no cyanosis no clubbing SKIN:  No rashes no nodules NEURO:  Cranial nerves II through XII grossly intact, motor grossly intact throughout PSYCH:  Cognitively intact, oriented to person place and time   EKG:  EKG is ordered today. The ekg ordered today demonstrates atrial fibrillateion.  Prior septal infarct.  Rate 60 bpm.  Unchanged from 10/22/15.  Echo 03/11/16: Study Conclusions  - Left ventricle: The cavity size was normal. Wall thickness was   increased in a pattern of moderate LVH. Systolic function was   normal. The estimated ejection fraction was in the range of 60%   to 65%. Wall motion was normal; there were no regional wall   motion abnormalities. - Mitral valve: Calcified annulus. Mildly thickened leaflets .  There was moderate to severe regurgitation. - Left atrium: Severely dilated at 75 ml/m2. - Right atrium: The atrium was mildly dilated. - Tricuspid valve: There was moderate regurgitation. - Pulmonary arteries: PA peak pressure: 41 mm Hg (S). - Inferior vena cava: The vessel was dilated. The respirophasic   diameter changes were blunted (< 50%), consistent with elevated   central venous pressure.  Impressions:  - Compared to the prior echo in 2015, there are few changes. The   left atrium now measures 75 ml/m2. There is persistent moderate   to severe mitral regurgitation.   Recent Labs: 09/08/2015: B Natriuretic Peptide 130.5 10/22/2015: ALT 23; BUN 15; Creat 0.72; Hemoglobin 14.3; Platelets 256; Potassium 3.5; Sodium 141    Lipid Panel    Component Value Date/Time   CHOL 245 (H) 10/22/2015 0849   TRIG 158 (H) 10/22/2015 0849   HDL 60 10/22/2015 0849   CHOLHDL 4.1 10/22/2015 0849   VLDL 32 (H) 10/22/2015 0849   LDLCALC 153 (H) 10/22/2015 0849   LDLDIRECT 134.7  08/28/2014 1009      Wt Readings from Last 3 Encounters:  05/21/16 149 lb 3.2 oz (67.7 kg)  03/01/16 152 lb 12.8 oz (69.3 kg)  02/09/16 150 lb 6.4 oz (68.2 kg)      ASSESSMENT AND PLAN:  # Moderate to severe MR:   # Rheumatic mitral valve disease: It seems as though Ms. Wisinski's rheumatic mitral valve disease may be getting worse. Her last echo was over a year ago. We will repeat her echocardiogram to see if she has worsening LV function, more severe mitral regurgitation, or elevated pulmonary pressures. If this is the case, she would like to be referred to Dr. Servando Snare for surgery. I explained to her that she would need right and left heart catheterizations and likely a transesophageal echo before the surgery. She expressed understanding.  # Hypokalemia: Potassium  levels were low-normal in the past and her potassium was recently reduced. Her hypokalemia is likely treatable to high doses of Lasix as well as being on HCTZ.  Ideally, she would not be on this many diuretics. We will check a basic metabolic panel today. Given that her edema is poorly-controlled she misses one dose of diuretic, I'm hesitant to switch around her diuretics until we know more about her echo.   # Persistent Atrial fibrillation: Rates are well-controlled today. However she reports increased palpitations at home. Given that her heart rate is low today we will not titrate her beta blocker at this time. Continue atenolol and warfarin.   # Hypertension:  Blood pressure is well-controlled on atenolol and triamterene-HCTZ.  # CV Disease Prevention: We will check fasting lipids and a comprehensive metabolic panel today.     Current medicines are reviewed at length with the patient today.  The patient does not have concerns regarding medicines.  The following changes have been made:  no change  Labs/ tests ordered today include:   Orders Placed This Encounter  Procedures  . CBC with Differential/Platelet  . Lipid  panel  . Comprehensive metabolic panel  . EKG 12-Lead  . ECHOCARDIOGRAM COMPLETE   Time spent: 45 minutes-Greater than 50% of this time was spent in counseling, explanation of diagnosis, planning of further management, and coordination of care.   Disposition:   FU with Saori Umholtz C. Oval Linsey, MD, [facility] in 1 month.    This note was written with the assistance of speech recognition software.  Please excuse any transcriptional errors.  Signed, Artyom Stencel C.  Oval Linsey, MD, [facility]  05/21/2016 2:36 PM    [facility]

## 2016-05-21 NOTE — Patient Instructions (Signed)
Medication Instructions:  Your physician recommends that you continue on your current medications as directed. Please refer to the Current Medication list given to you today.  Labwork: Lp/cmet/cbc at [facility] lab on the first floor  Testing/Procedures: Your physician has requested that you have an echocardiogram. Echocardiography is a painless test that uses sound waves to create images of your heart. It provides your doctor with information about the size and shape of your heart and how well your heart's chambers and valves are working. This procedure takes approximately one hour. There are no restrictions for this procedure. At the [facility] office  Follow-Up: Your physician recommends that you schedule a follow-up appointment in: 1 month ov  If you need a refill on your cardiac medications before your next appointment, please call your pharmacy.

## 2016-05-24 ENCOUNTER — Ambulatory Visit: Payer: Medicare Other | Admitting: Family Medicine

## 2016-05-24 ENCOUNTER — Other Ambulatory Visit: Payer: Self-pay | Admitting: Family Medicine

## 2016-05-24 NOTE — Telephone Encounter (Signed)
I know she has been on this for a long time- but I would really like to see her try to taper off ( or at least back) as much as possible.  Benzos are associated with higher fall risk with increasing age.  May refill once.  Would have her try cutting back to one half qhs.

## 2016-05-26 DIAGNOSIS — D225 Melanocytic nevi of trunk: Secondary | ICD-10-CM | POA: Diagnosis not present

## 2016-05-26 DIAGNOSIS — T07 Unspecified multiple injuries: Secondary | ICD-10-CM | POA: Diagnosis not present

## 2016-05-26 DIAGNOSIS — Z85828 Personal history of other malignant neoplasm of skin: Secondary | ICD-10-CM | POA: Diagnosis not present

## 2016-05-26 DIAGNOSIS — Z08 Encounter for follow-up examination after completed treatment for malignant neoplasm: Secondary | ICD-10-CM | POA: Diagnosis not present

## 2016-05-26 DIAGNOSIS — Z1283 Encounter for screening for malignant neoplasm of skin: Secondary | ICD-10-CM | POA: Diagnosis not present

## 2016-05-26 DIAGNOSIS — L570 Actinic keratosis: Secondary | ICD-10-CM | POA: Diagnosis not present

## 2016-05-27 ENCOUNTER — Telehealth: Payer: Self-pay | Admitting: *Deleted

## 2016-05-27 MED ORDER — EZETIMIBE 10 MG PO TABS: 10.0000 mg | ORAL_TABLET | Freq: Every day | ORAL | 5 refills | Status: DC

## 2016-05-27 NOTE — Telephone Encounter (Signed)
-----   Message from [name], MD sent at 05/26/2016  1:50 PM EDT ----- Cholesterol higher than 7 months ago. She is at high risk of developing heart attack or strokes.  Suggest trying Zetia 10 mg daily since this isn't a statin.

## 2016-05-27 NOTE — Telephone Encounter (Signed)
Advised patient of lab results and sent Rx to pharmacy  

## 2016-06-04 ENCOUNTER — Other Ambulatory Visit (HOSPITAL_COMMUNITY): Payer: Medicare Other

## 2016-06-07 ENCOUNTER — Other Ambulatory Visit: Payer: Self-pay

## 2016-06-07 ENCOUNTER — Ambulatory Visit (HOSPITAL_COMMUNITY): Payer: Medicare Other | Attending: Internal Medicine

## 2016-06-07 ENCOUNTER — Ambulatory Visit (INDEPENDENT_AMBULATORY_CARE_PROVIDER_SITE_OTHER): Payer: Medicare Other | Admitting: General Practice

## 2016-06-07 DIAGNOSIS — I4891 Unspecified atrial fibrillation: Secondary | ICD-10-CM | POA: Diagnosis not present

## 2016-06-07 DIAGNOSIS — I119 Hypertensive heart disease without heart failure: Secondary | ICD-10-CM | POA: Insufficient documentation

## 2016-06-07 DIAGNOSIS — R0602 Shortness of breath: Secondary | ICD-10-CM | POA: Diagnosis not present

## 2016-06-07 DIAGNOSIS — I340 Nonrheumatic mitral (valve) insufficiency: Secondary | ICD-10-CM | POA: Insufficient documentation

## 2016-06-07 DIAGNOSIS — Z5181 Encounter for therapeutic drug level monitoring: Secondary | ICD-10-CM | POA: Diagnosis not present

## 2016-06-07 DIAGNOSIS — J449 Chronic obstructive pulmonary disease, unspecified: Secondary | ICD-10-CM | POA: Insufficient documentation

## 2016-06-07 DIAGNOSIS — Z7901 Long term (current) use of anticoagulants: Secondary | ICD-10-CM

## 2016-06-07 DIAGNOSIS — I059 Rheumatic mitral valve disease, unspecified: Secondary | ICD-10-CM | POA: Diagnosis present

## 2016-06-07 LAB — POCT INR: INR: 2.9

## 2016-06-13 NOTE — Progress Notes (Signed)
Cardiology Office Note   Date:  06/14/2016   ID:  Catherine [name], DOB Dec 20, 1934, MRN [id number]  PCP:  Catherine Post, MD  Cardiologist:   Catherine Latch, MD   Chief Complaint  Patient presents with  . Follow-up    1 Month; sob;frequently.lighthead; when bending over. cramping in legs at night.edema; in feet.     History of Present Illness: Catherine [name] is a 81 y.o. female with chronic atrial fibrillation, hypertension, moderate mitral regurgitation, rheumatic mitral valve disease, and COPD who presents for follow up. Catherine [name] was previously a patient of Catherine [name]. She underwent mitral commissurotomy in the 1970s. On her echocardiogram 02/2014 she had an ejection fraction of 60-65% with moderate to severe mitral regurgitation. There was also mild aortic regurgitation. Her follow-up echo 02/2015 showed an EF of 60-65% with moderate to severe mitral regurgitation, moderate tricuspid regurgitation and elevated pulmonary artery pressures.  At her last clinic appointment 05/21/16 she reported progressive fatigue and shortness of breath, so she was referred for an echo 9/11/7, that was revealed moderate pulmonary stenosis with moderate to severe mitral regurgitation.  She did not have any pulmonary hypertension and her LV was not dilated.    Since her last appointment Catherine [name] has been feeling up and down.  She has good days and bad days. Last week she had increased shortness of breath that did not respond to using her inhaler. Today he has been a better day. She also notes some swelling in her lower extremities, left greater than right, she denies orthopnea or PND. She continues to have episodes of palpitations intermittently.  She notes that her symptoms are somewhat exertional but also occurs at rest. In the past she liked to walk half a mile 5 days per week. Lately, she is only been able to do this one or 2 times per week and cannot always walked for the full half mile.  She is  limited by shortness of breath but denies chest pain.  Past Medical History:  Diagnosis Date  . Arthritis   . Asthma    last attack 02/2015  . Atrial fibrillation, chronic ([place])   . Chronic anticoagulation   . Chronic kidney disease    "RIGHT MANY KIDNEY INFECTIONS AND STONES"  . COPD (chronic obstructive pulmonary disease) ([name])   . H/O: rheumatic fever   . Heart murmur   . Hypertension   . PONV (postoperative nausea and vomiting)    ' SOMETIMES', BUT NOT ALWAYS"  . UTI (urinary tract infection)   . Valvular heart disease    Has mitral stenosis with prior mitral commissurotomy in 1970    Past Surgical History:  Procedure Laterality Date  . ABDOMINAL HYSTERECTOMY  1983   endometriosis  . BACK SURGERY     neurosurgery  . CARDIAC CATHETERIZATION    . cataract surg    . CHOLECYSTECTOMY N/A 05/30/2015   Procedure: LAPAROSCOPIC CHOLECYSTECTOMY WITH INTRAOPERATIVE CHOLANGIOGRAM;  Surgeon: Excell Seltzer, MD;  Location: [place];  Service: General;  Laterality: N/A;  . commissurotomy  1970  . EYE SURGERY    . OTHER SURGICAL HISTORY     hysterectomy     Current Outpatient Prescriptions  Medication Sig Dispense Refill  . atenolol (TENORMIN) 25 MG tablet Take 0.5 tablets (12.5 mg total) by mouth daily. 45 tablet 2  . B Complex-C (B-COMPLEX WITH VITAMIN C) tablet Take 1 tablet by mouth daily.    . budesonide-formoterol (SYMBICORT) 80-4.5 MCG/ACT inhaler Inhale 2 puffs  into the lungs 2 (two) times daily. 1 Inhaler 11  . diazepam (VALIUM) 5 MG tablet TAKE 1/2 TABLET (2.5 MG) DAILY AS NEEDED. USE SPARINGLY. 30 tablet 0  . ezetimibe (ZETIA) 10 MG tablet Take 1 tablet (10 mg total) by mouth daily. 30 tablet 5  . furosemide (LASIX) 80 MG tablet Take 40 mg by mouth 2 (two) times daily.    . Multiple Vitamin (MULTIVITAMIN) tablet Take 1 tablet by mouth daily.      . potassium chloride (K-DUR) 10 MEQ tablet Take 5 tablets (50 mEq total) by mouth once. 900 tablet 1  . PROAIR HFA 108 (90  Base) MCG/ACT inhaler 2 puffs every 6 hours as needed for wheezi ng or shortness of breath. 8.5 g 2  . triamterene-hydrochlorothiazide (MAXZIDE-25) 37.5-25 MG tablet Take 1 tablet by mouth daily.    Marland Kitchen warfarin (COUMADIN) 2 MG tablet Take as directed by anticoagulation clinic (Patient taking differently: take 2 mg 5 days 2.5 mg 2 days) 105 tablet 1   No current facility-administered medications for this visit.     Allergies:   Aldactone [spironolactone]; Amoxicillin; Diltiazem; Flagyl [metronidazole hcl]; Flovent [fluticasone propionate]; Gabapentin; Lyrica [pregabalin]; Quinidine; Simvastatin; Tramadol; Verapamil; Ace inhibitors; Benazepril hcl; Ciprocin-fluocin-procin [fluocinolone acetonide]; Ciprofloxacin; Codeine; and Nitrofurantoin monohyd macro    Social History:  The patient  reports that she quit smoking about 40 years ago. Her smoking use included Cigarettes. She has a 15.00 pack-year smoking history. She has never used smokeless tobacco. She reports that she does not drink alcohol or use drugs.   Family History:  The patient's family history includes Leukemia in her father.    ROS:  Please see the history of present illness.   Otherwise, review of systems are positive for none.   All other systems are reviewed and negative.    PHYSICAL EXAM: VS:  BP 115/72   Pulse (!) 57   Ht 5\' 4"  (1.626 m)   Wt 149 lb (67.6 kg)   BMI 25.58 kg/m  , BMI Body mass index is 25.58 kg/m. GENERAL:  Well appearing HEENT:  Pupils equal round and reactive, fundi not visualized, oral mucosa unremarkable NECK:  No jugular venous distention, waveform within normal limits, carotid upstroke brisk and symmetric, no bruits, no thyromegaly LYMPHATICS:  No cervical adenopathy LUNGS:  Clear to auscultation bilaterally HEART:  Irregularly irregular.  PMI not displaced or sustained,S1 and S2 within normal limits, no S3, no S4, no clicks, no rubs, II/VI systolic murmur at the RUSB and III/VI holosystolic murmur  at the apex. ABD:  Flat, positive bowel sounds normal in frequency in pitch, no bruits, no rebound, no guarding, no midline pulsatile mass, no hepatomegaly, no splenomegaly EXT:  2 plus pulses throughout, no edema, no cyanosis no clubbing SKIN:  No rashes no nodules NEURO:  Cranial nerves II through XII grossly intact, motor grossly intact throughout PSYCH:  Cognitively intact, oriented to person place and time   EKG:  EKG is not ordered today. The ekg ordered 05/21/16 demonstrates atrial fibrillateion.  Prior septal infarct.  Rate 60 bpm.  Unchanged from 10/22/15.  Echo 06/07/16:  Study Conclusions  - Left ventricle: The cavity size was normal. Wall thickness was   normal. Systolic function was vigorous. The estimated ejection   fraction was in the range of 65% to 70%. - Mitral valve: MV is thickened. with some restricted motion Peak   and mean gradients through the valve are 22 and 8 mm Hg   respectively MVA by  Pt1/2 is 1.9 cm2. Calcified annulus. Mildly   thickened leaflets . There was moderate to severe regurgitation. - Left atrium: The atrium was massively dilated. - Right atrium: The atrium was mildly dilated.   Echo 03/12/15: Study Conclusions  - Left ventricle: The cavity size was normal. Wall thickness was   increased in a pattern of moderate LVH. Systolic function was   normal. The estimated ejection fraction was in the range of 60%   to 65%. Wall motion was normal; there were no regional wall   motion abnormalities. - Mitral valve: Calcified annulus. Mildly thickened leaflets .   There was moderate to severe regurgitation. - Left atrium: Severely dilated at 75 ml/m2. - Right atrium: The atrium was mildly dilated. - Tricuspid valve: There was moderate regurgitation. - Pulmonary arteries: PA peak pressure: 41 mm Hg (S). - Inferior vena cava: The vessel was dilated. The respirophasic   diameter changes were blunted (< 50%), consistent with elevated   central venous  pressure.  Impressions:  - Compared to the prior echo in 2015, there are few changes. The   left atrium now measures 75 ml/m2. There is persistent moderate   to severe mitral regurgitation.   Recent Labs: 09/08/2015: B Natriuretic Peptide 130.5 05/21/2016: ALT 21; BUN 16; Creat 0.80; Hemoglobin 14.5; Platelets 241; Potassium 3.4; Sodium 146    Lipid Panel    Component Value Date/Time   CHOL 260 (H) 05/21/2016 1230   TRIG 161 (H) 05/21/2016 1230   HDL 71 05/21/2016 1230   CHOLHDL 3.7 05/21/2016 1230   VLDL 32 (H) 05/21/2016 1230   LDLCALC 157 (H) 05/21/2016 1230   LDLDIRECT 134.7 08/28/2014 1009      Wt Readings from Last 3 Encounters:  06/14/16 149 lb (67.6 kg)  05/21/16 149 lb 3.2 oz (67.7 kg)  03/01/16 152 lb 12.8 oz (69.3 kg)      ASSESSMENT AND PLAN:  # Moderate to severe MR:  # Moderate mitral regurgitation:  # Rheumatic mitral valve disease: # Shortness of breath: Ms. Broeker has moderate mitral stenosis and moderate to severe mitral regurgitation. She is not have any enlargement of her left ventricle in her ventricular function is normal. She does not have pulmonary hypertension. We will obtain a transesophageal echo to better evaluate her mitral valve and ensure that she does not actually have severe valvular heart disease.  If this is the case, she would like to be referred to Dr. Servando Snare for surgery.  If her valvular heart disease is not severe, we will refer her for stress testing versus cardiac catheterization.  # Persistent Atrial fibrillation: Rates remain well-controlled today. However she reports increased palpitations at home. Given that her heart rate is low today we will not titrate her beta blocker at this time. Continue atenolol and warfarin.   # Hypertension:  Blood pressure is well-controlled on atenolol and triamterene-HCTZ.  # CV Disease Prevention: Lipids are elevated but she has not tolerated statins in the past.  Continues Zetia.  If she is  found to have coronary artery disease on either stress or cath we will work aggressively with her cholesterol management.    Current medicines are reviewed at length with the patient today.  The patient does not have concerns regarding medicines.  The following changes have been made:  no change  Labs/ tests ordered today include:   Orders Placed This Encounter  Procedures  . Procedural/ Surgical Case Request: TRANSESOPHAGEAL ECHOCARDIOGRAM (TEE)  . CBC with Differential/Platelet  . Basic  metabolic panel  . INR/PT   Time spent: 45 minutes-Greater than 50% of this time was spent in counseling, explanation of diagnosis, planning of further management, and coordination of care.   Disposition:   FU with Devaney Segers C. Oval Linsey, MD, [facility] in 2 month.    This note was written with the assistance of speech recognition software.  Please excuse any transcriptional errors.  Signed, Vivan Agostino C. Oval Linsey, MD, [facility]  06/14/2016 6:16 PM    [facility]

## 2016-06-14 ENCOUNTER — Encounter: Payer: Self-pay | Admitting: Cardiovascular Disease

## 2016-06-14 ENCOUNTER — Ambulatory Visit (INDEPENDENT_AMBULATORY_CARE_PROVIDER_SITE_OTHER): Payer: Medicare Other | Admitting: Cardiovascular Disease

## 2016-06-14 VITALS — BP 115/72 | HR 57 | Ht 64.0 in | Wt 149.0 lb

## 2016-06-14 DIAGNOSIS — I340 Nonrheumatic mitral (valve) insufficiency: Secondary | ICD-10-CM

## 2016-06-14 DIAGNOSIS — I10 Essential (primary) hypertension: Secondary | ICD-10-CM

## 2016-06-14 DIAGNOSIS — I482 Chronic atrial fibrillation, unspecified: Secondary | ICD-10-CM

## 2016-06-14 DIAGNOSIS — R0602 Shortness of breath: Secondary | ICD-10-CM

## 2016-06-14 DIAGNOSIS — I050 Rheumatic mitral stenosis: Secondary | ICD-10-CM

## 2016-06-14 NOTE — Patient Instructions (Addendum)
Medication Instructions:  Your physician recommends that you continue on your current medications as directed. Please refer to the Current Medication list given to you today.  Labwork: BMET/CBC/PT/INR AT [facility] ON THE FIRST FLOOR WITHIN 2 WEEKS OF PROCEDURE   Testing/Procedures: Your physician has requested that you have a TEE. During a TEE, sound waves are used to create images of your heart. It provides your doctor with information about the size and shape of your heart and how well your heart's chambers and valves are working. In this test, a transducer is attached to the end of a flexible tube that's guided down your throat and into your esophagus (the tube leading from you mouth to your stomach) to get a more detailed image of your heart. You are not awake for the procedure. Please see the instruction sheet given to you today. For further information please visit [email or web address]. [facility] ON 07/05/16 AT 1:00  Follow-Up: Your physician recommends that you schedule a follow-up appointment in: 2 MONTH OV  If you need a refill on your cardiac medications before your next appointment, please call your pharmacy.   Transesophageal Echocardiogram Transesophageal echocardiography (TEE) is a special type of test that produces images of the heart by using sound waves (echocardiogram). This type of echocardiography can obtain better images of the heart than standard echocardiography. TEE is done by passing a flexible tube down the esophagus. The heart is located in front of the esophagus. Because the heart and esophagus are close to one another, your health care provider can take very clear, detailed pictures of the heart via ultrasound waves. TEE may be done:  If your health care provider needs more information based on standard echocardiography findings.  If you had a stroke. This might have happened because a clot formed in your heart. TEE can visualize different areas of the heart and  check for clots.  To check valve anatomy and function.  To check for infection on the inside of your heart (endocarditis).  To evaluate the dividing wall (septum) of the heart and presence of a hole that did not close after birth (patent foramen ovale or atrial septal defect).  To help diagnose a tear in the wall of the aorta (aortic dissection).  During cardiac valve surgery. This allows the surgeon to assess the valve repair before closing the chest.  During a variety of other cardiac procedures to guide positioning of catheters.  Sometimes before a cardioversion, which is a shock to convert heart rhythm back to normal. LET [facility] CARE PROVIDER KNOW ABOUT:   Any allergies you have.  All medicines you are taking, including vitamins, herbs, eye drops, creams, and over-the-counter medicines.  Previous problems you or members of your family have had with the use of anesthetics.  Any blood disorders you have.  Previous surgeries you have had.  Medical conditions you have.  Swallowing difficulties.  An esophageal obstruction. RISKS AND COMPLICATIONS  Generally, TEE is a safe procedure. However, as with any procedure, complications can occur. Possible complications include an esophageal tear (rupture). BEFORE THE PROCEDURE   Do not eat or drink for 6 hours before the procedure or as directed by your health care provider.  Arrange for someone to drive you home after the procedure. Do not drive yourself home. During the procedure, you will be given medicines that can continue to make you feel drowsy and can impair your reflexes.  An IV access tube will be started in the arm. PROCEDURE  A medicine to help you relax (sedative) will be given through the IV access tube.  A medicine may be sprayed or gargled to numb the back of the throat.  Your blood pressure, heart rate, and breathing (vital signs) will be monitored during the procedure.  The TEE probe is a long, flexible  tube. The tip of the probe is placed into the back of the mouth, and you will be asked to swallow. This helps to pass the tip of the probe into the esophagus. Once the tip of the probe is in the correct area, your health care provider can take pictures of the heart.  TEE is usually not a painful procedure. You may feel the probe press against the back of the throat. The probe does not enter the trachea and does not affect your breathing. AFTER THE PROCEDURE   You will be in bed, resting, until you have fully returned to consciousness.  When you first awaken, your throat may feel slightly sore and will probably still feel numb. This will improve slowly over time.  You will not be allowed to eat or drink until it is clear that the numbness has improved.  Once you have been able to drink, urinate, and sit on the edge of the bed without feeling sick to your stomach (nausea) or dizzy, you may be cleared to go home.  You should have a friend or family member with you for the next 24 hours after your procedure.   This information is not intended to replace advice given to you by your health care provider. Make sure you discuss any questions you have with your health care provider.   Document Released: 12/04/2002 Document Revised: 09/18/2013 Document Reviewed: 03/15/2013 Elsevier Interactive Patient Education [employer].

## 2016-06-21 ENCOUNTER — Other Ambulatory Visit: Payer: Self-pay | Admitting: Family Medicine

## 2016-06-22 NOTE — Telephone Encounter (Signed)
Last seen 06.05.17 Last filled 08.29.17 for 30 tabs, with 0 refills.  Refill okay?

## 2016-06-22 NOTE — Telephone Encounter (Signed)
Refill once.  Recommend set up office follow up .  Would like to discuss tapering back this medication secondary to increased risk of falls in elderly.

## 2016-06-23 NOTE — Telephone Encounter (Signed)
rx sent

## 2016-06-24 ENCOUNTER — Telehealth: Payer: Self-pay | Admitting: Cardiovascular Disease

## 2016-06-24 NOTE — Telephone Encounter (Signed)
error 

## 2016-06-25 ENCOUNTER — Ambulatory Visit: Payer: Medicare Other | Admitting: Cardiovascular Disease

## 2016-06-25 DIAGNOSIS — I482 Chronic atrial fibrillation: Secondary | ICD-10-CM | POA: Diagnosis not present

## 2016-06-25 LAB — CBC WITH DIFFERENTIAL/PLATELET
BASOS ABS: 65 {cells}/uL (ref 0–200)
Basophils Relative: 1 %
Eosinophils Absolute: 130 cells/uL (ref 15–500)
Eosinophils Relative: 2 %
HEMATOCRIT: 40.9 % (ref 35.0–45.0)
HEMOGLOBIN: 14.1 g/dL (ref 11.7–15.5)
LYMPHS ABS: 2080 {cells}/uL (ref 850–3900)
Lymphocytes Relative: 32 %
MCH: 30.9 pg (ref 27.0–33.0)
MCHC: 34.5 g/dL (ref 32.0–36.0)
MCV: 89.7 fL (ref 80.0–100.0)
MONO ABS: 520 {cells}/uL (ref 200–950)
MPV: 9.3 fL (ref 7.5–12.5)
Monocytes Relative: 8 %
NEUTROS PCT: 57 %
Neutro Abs: 3705 cells/uL (ref 1500–7800)
Platelets: 243 10*3/uL (ref 140–400)
RBC: 4.56 MIL/uL (ref 3.80–5.10)
RDW: 14.5 % (ref 11.0–15.0)
WBC: 6.5 10*3/uL (ref 3.8–10.8)

## 2016-06-26 LAB — BASIC METABOLIC PANEL
BUN: 17 mg/dL (ref 7–25)
CO2: 29 mmol/L (ref 20–31)
Calcium: 9.7 mg/dL (ref 8.6–10.4)
Chloride: 101 mmol/L (ref 98–110)
Creat: 0.83 mg/dL (ref 0.60–0.93)
GLUCOSE: 94 mg/dL (ref 65–99)
POTASSIUM: 3.4 mmol/L — AB (ref 3.5–5.3)
Sodium: 144 mmol/L (ref 135–146)

## 2016-06-26 LAB — PROTIME-INR
INR: 2.1 — ABNORMAL HIGH
PROTHROMBIN TIME: 21.3 s — AB (ref 9.0–11.5)

## 2016-07-01 ENCOUNTER — Telehealth: Payer: Self-pay | Admitting: *Deleted

## 2016-07-01 MED ORDER — POTASSIUM CHLORIDE ER 10 MEQ PO TBCR: 80.0000 meq | EXTENDED_RELEASE_TABLET | Freq: Once | ORAL | 1 refills | Status: DC

## 2016-07-01 NOTE — Telephone Encounter (Signed)
-----   Message from [name], MD sent at 07/01/2016  5:45 PM EDT ----- INR is at goal.  Potassium is a little low.  Increase potassium to 80 mEq daily.

## 2016-07-01 NOTE — Telephone Encounter (Signed)
Advised patient of lab results and sent new Rx to pharmacy  

## 2016-07-02 ENCOUNTER — Telehealth: Payer: Self-pay | Admitting: Family Medicine

## 2016-07-02 ENCOUNTER — Other Ambulatory Visit: Payer: Self-pay

## 2016-07-02 NOTE — Telephone Encounter (Signed)
Pt need new Rx for potassium and Valium  Pharm:  [facility] [phone]  Pt would like to know why her potassium was cut not her cardiologist state that is normal to low.  Pt will be have a procedure done on Monday 07/05/16 at 12 and would like to have a refill her valium.

## 2016-07-02 NOTE — Telephone Encounter (Signed)
Please double check, but it looks like PCP refilled valium < 2 weeks ago with instructions to use 1/2 tablet daily prn sparingly and appt prior to refills. If this is correct, will need to defer to when PCP returns for refills or would need appt for short term refill. Also would defer to PCP regarding the potassium question, but ok to refill current potassium dose x47month. Thanks.

## 2016-07-02 NOTE — Telephone Encounter (Signed)
Can you help with this since Dr. Elease Hashimoto is not in the office today?

## 2016-07-05 ENCOUNTER — Encounter (HOSPITAL_COMMUNITY): Payer: Self-pay | Admitting: Cardiovascular Disease

## 2016-07-05 ENCOUNTER — Encounter (HOSPITAL_COMMUNITY)
Admission: RE | Disposition: A | Discharge status: Home / Self Care | Payer: Self-pay | Source: Ambulatory Visit | Attending: Cardiovascular Disease

## 2016-07-05 ENCOUNTER — Ambulatory Visit (HOSPITAL_COMMUNITY)
Admission: RE | Admit: 2016-07-05 | Discharge: 2016-07-05 | Disposition: A | Discharge status: Home / Self Care | Payer: Medicare Other | Source: Ambulatory Visit | Attending: Cardiovascular Disease | Admitting: Cardiovascular Disease

## 2016-07-05 ENCOUNTER — Ambulatory Visit: Payer: Medicare Other

## 2016-07-05 ENCOUNTER — Ambulatory Visit (HOSPITAL_BASED_OUTPATIENT_CLINIC_OR_DEPARTMENT_OTHER): Payer: Medicare Other

## 2016-07-05 DIAGNOSIS — I482 Chronic atrial fibrillation: Secondary | ICD-10-CM | POA: Insufficient documentation

## 2016-07-05 DIAGNOSIS — I481 Persistent atrial fibrillation: Secondary | ICD-10-CM | POA: Insufficient documentation

## 2016-07-05 DIAGNOSIS — Z87440 Personal history of urinary (tract) infections: Secondary | ICD-10-CM | POA: Diagnosis not present

## 2016-07-05 DIAGNOSIS — Z79891 Long term (current) use of opiate analgesic: Secondary | ICD-10-CM | POA: Diagnosis not present

## 2016-07-05 DIAGNOSIS — I083 Combined rheumatic disorders of mitral, aortic and tricuspid valves: Secondary | ICD-10-CM | POA: Diagnosis not present

## 2016-07-05 DIAGNOSIS — I129 Hypertensive chronic kidney disease with stage 1 through stage 4 chronic kidney disease, or unspecified chronic kidney disease: Secondary | ICD-10-CM | POA: Diagnosis not present

## 2016-07-05 DIAGNOSIS — Z7901 Long term (current) use of anticoagulants: Secondary | ICD-10-CM | POA: Insufficient documentation

## 2016-07-05 DIAGNOSIS — M1990 Unspecified osteoarthritis, unspecified site: Secondary | ICD-10-CM | POA: Diagnosis not present

## 2016-07-05 DIAGNOSIS — I342 Nonrheumatic mitral (valve) stenosis: Secondary | ICD-10-CM

## 2016-07-05 DIAGNOSIS — J449 Chronic obstructive pulmonary disease, unspecified: Secondary | ICD-10-CM | POA: Insufficient documentation

## 2016-07-05 DIAGNOSIS — Z880 Allergy status to penicillin: Secondary | ICD-10-CM | POA: Insufficient documentation

## 2016-07-05 DIAGNOSIS — Z87891 Personal history of nicotine dependence: Secondary | ICD-10-CM | POA: Insufficient documentation

## 2016-07-05 DIAGNOSIS — I340 Nonrheumatic mitral (valve) insufficiency: Secondary | ICD-10-CM | POA: Diagnosis present

## 2016-07-05 DIAGNOSIS — N189 Chronic kidney disease, unspecified: Secondary | ICD-10-CM | POA: Diagnosis not present

## 2016-07-05 HISTORY — PX: TEE WITHOUT CARDIOVERSION: SHX5443

## 2016-07-05 SURGERY — ECHOCARDIOGRAM, TRANSESOPHAGEAL
Anesthesia: Moderate Sedation

## 2016-07-05 MED ORDER — DIPHENHYDRAMINE HCL 50 MG/ML IJ SOLN
INTRAMUSCULAR | Status: AC
  Filled 2016-07-05: qty 1

## 2016-07-05 MED ORDER — DIPHENHYDRAMINE HCL 50 MG/ML IJ SOLN
INTRAMUSCULAR | Status: DC | PRN
  Administered 2016-07-05: 25 mg via INTRAVENOUS

## 2016-07-05 MED ORDER — BUTAMBEN-TETRACAINE-BENZOCAINE 2-2-14 % EX AERO
INHALATION_SPRAY | CUTANEOUS | Status: DC | PRN
  Administered 2016-07-05: 2 via TOPICAL

## 2016-07-05 MED ORDER — MIDAZOLAM HCL 10 MG/2ML IJ SOLN
INTRAMUSCULAR | Status: DC | PRN
  Administered 2016-07-05 (×3): 2 mg via INTRAVENOUS

## 2016-07-05 MED ORDER — FENTANYL CITRATE (PF) 100 MCG/2ML IJ SOLN
INTRAMUSCULAR | Status: DC | PRN
  Administered 2016-07-05 (×3): 25 ug via INTRAVENOUS

## 2016-07-05 MED ORDER — DIAZEPAM 5 MG PO TABS: 2.5000 mg | ORAL_TABLET | Freq: Every day | ORAL | 0 refills | Status: DC | PRN

## 2016-07-05 MED ORDER — SODIUM CHLORIDE 0.9 % IV SOLN
INTRAVENOUS | Status: DC
  Administered 2016-07-05: 12:00:00 via INTRAVENOUS

## 2016-07-05 MED ORDER — MIDAZOLAM HCL 5 MG/ML IJ SOLN
INTRAMUSCULAR | Status: AC
  Filled 2016-07-05: qty 2

## 2016-07-05 MED ORDER — FENTANYL CITRATE (PF) 100 MCG/2ML IJ SOLN
INTRAMUSCULAR | Status: AC
  Filled 2016-07-05: qty 2

## 2016-07-05 NOTE — Discharge Instructions (Signed)

## 2016-07-05 NOTE — Progress Notes (Signed)
  Echocardiogram Echocardiogram Transesophageal has been performed.  Darlina Sicilian M 07/05/2016, 1:50 PM

## 2016-07-05 NOTE — CV Procedure (Signed)
Brief TEE  LVEF >60% Moderate MR, moderate MS Mild PR LA severely enlarged No LA/LAA thrombus or mass  During this procedure the patient is administered a total of Versed 6 mg and Fentanyl 75 mcg to achieve and maintain moderate conscious sedation.  The patient's heart rate, blood pressure, and oxygen saturation are monitored continuously during the procedure. The period of conscious sedation is 30 minutes, of which I was present face-to-face 100% of this time.  For additional detail see full report.  Marney Treloar C. Oval Linsey, MD, [facility]  07/05/2016  1:29 PM

## 2016-07-05 NOTE — H&P (View-Only) (Signed)
Cardiology Office Note   Date:  06/14/2016   ID:  Catherine [name], DOB 1934/12/08, MRN [id number]  PCP:  [name], MD  Cardiologist:   [name], MD   Chief Complaint  Patient presents with  . Follow-up    1 Month; sob;frequently.lighthead; when bending over. cramping in legs at night.edema; in feet.     History of Present Illness: Catherine [name] is a 81 y.o. female with chronic atrial fibrillation, hypertension, moderate mitral regurgitation, rheumatic mitral valve disease, and COPD who presents for follow up. [name] was previously a patient of [name]. She underwent mitral commissurotomy in the 1970s. On her echocardiogram 02/2014 she had an ejection fraction of 60-65% with moderate to severe mitral regurgitation. There was also mild aortic regurgitation. Her follow-up echo 02/2015 showed an EF of 60-65% with moderate to severe mitral regurgitation, moderate tricuspid regurgitation and elevated pulmonary artery pressures.  At her last clinic appointment 05/21/16 she reported progressive fatigue and shortness of breath, so she was referred for an echo 9/11/7, that was revealed moderate pulmonary stenosis with moderate to severe mitral regurgitation.  She did not have any pulmonary hypertension and her LV was not dilated.    Since her last appointment [name] has been feeling up and down.  She has good days and bad days. Last week she had increased shortness of breath that did not respond to using her inhaler. Today he has been a better day. She also notes some swelling in her lower extremities, left greater than right, she denies orthopnea or PND. She continues to have episodes of palpitations intermittently.  She notes that her symptoms are somewhat exertional but also occurs at rest. In the past she liked to walk half a mile 5 days per week. Lately, she is only been able to do this one or 2 times per week and cannot always walked for the full half mile.  She is  limited by shortness of breath but denies chest pain.  Past Medical History:  Diagnosis Date  . Arthritis   . Asthma    last attack 02/2015  . Atrial fibrillation, chronic ([place])   . Chronic anticoagulation   . Chronic kidney disease    "RIGHT MANY KIDNEY INFECTIONS AND STONES"  . COPD (chronic obstructive pulmonary disease) ([place])   . H/O: rheumatic fever   . Heart murmur   . Hypertension   . PONV (postoperative nausea and vomiting)    ' SOMETIMES', BUT NOT ALWAYS"  . UTI (urinary tract infection)   . Valvular heart disease    Has mitral stenosis with prior mitral commissurotomy in 1970    Past Surgical History:  Procedure Laterality Date  . ABDOMINAL HYSTERECTOMY  1983   endometriosis  . BACK SURGERY     neurosurgery  . CARDIAC CATHETERIZATION    . cataract surg    . CHOLECYSTECTOMY N/A 05/30/2015   Procedure: LAPAROSCOPIC CHOLECYSTECTOMY WITH INTRAOPERATIVE CHOLANGIOGRAM;  Surgeon: Excell Seltzer, MD;  Location: [facility];  Service: General;  Laterality: N/A;  . commissurotomy  1970  . EYE SURGERY    . OTHER SURGICAL HISTORY     hysterectomy     Current Outpatient Prescriptions  Medication Sig Dispense Refill  . atenolol (TENORMIN) 25 MG tablet Take 0.5 tablets (12.5 mg total) by mouth daily. 45 tablet 2  . B Complex-C (B-COMPLEX WITH VITAMIN C) tablet Take 1 tablet by mouth daily.    . budesonide-formoterol (SYMBICORT) 80-4.5 MCG/ACT inhaler Inhale 2 puffs  into the lungs 2 (two) times daily. 1 Inhaler 11  . diazepam (VALIUM) 5 MG tablet TAKE 1/2 TABLET (2.5 MG) DAILY AS NEEDED. USE SPARINGLY. 30 tablet 0  . ezetimibe (ZETIA) 10 MG tablet Take 1 tablet (10 mg total) by mouth daily. 30 tablet 5  . furosemide (LASIX) 80 MG tablet Take 40 mg by mouth 2 (two) times daily.    . Multiple Vitamin (MULTIVITAMIN) tablet Take 1 tablet by mouth daily.      . potassium chloride (K-DUR) 10 MEQ tablet Take 5 tablets (50 mEq total) by mouth once. 900 tablet 1  . PROAIR HFA 108 (90  Base) MCG/ACT inhaler 2 puffs every 6 hours as needed for wheezi ng or shortness of breath. 8.5 g 2  . triamterene-hydrochlorothiazide (MAXZIDE-25) 37.5-25 MG tablet Take 1 tablet by mouth daily.    Marland Kitchen warfarin (COUMADIN) 2 MG tablet Take as directed by anticoagulation clinic (Patient taking differently: take 2 mg 5 days 2.5 mg 2 days) 105 tablet 1   No current facility-administered medications for this visit.     Allergies:   Aldactone [spironolactone]; Amoxicillin; Diltiazem; Flagyl [metronidazole hcl]; Flovent [fluticasone propionate]; Gabapentin; Lyrica [pregabalin]; Quinidine; Simvastatin; Tramadol; Verapamil; Ace inhibitors; Benazepril hcl; Ciprocin-fluocin-procin [fluocinolone acetonide]; Ciprofloxacin; Codeine; and Nitrofurantoin monohyd macro    Social History:  The patient  reports that she quit smoking about 40 years ago. Her smoking use included Cigarettes. She has a 15.00 pack-year smoking history. She has never used smokeless tobacco. She reports that she does not drink alcohol or use drugs.   Family History:  The patient's family history includes Leukemia in her father.    ROS:  Please see the history of present illness.   Otherwise, review of systems are positive for none.   All other systems are reviewed and negative.    PHYSICAL EXAM: VS:  BP 115/72   Pulse (!) 57   Ht 5\' 4"  (1.626 m)   Wt 149 lb (67.6 kg)   BMI 25.58 kg/m  , BMI Body mass index is 25.58 kg/m. GENERAL:  Well appearing HEENT:  Pupils equal round and reactive, fundi not visualized, oral mucosa unremarkable NECK:  No jugular venous distention, waveform within normal limits, carotid upstroke brisk and symmetric, no bruits, no thyromegaly LYMPHATICS:  No cervical adenopathy LUNGS:  Clear to auscultation bilaterally HEART:  Irregularly irregular.  PMI not displaced or sustained,S1 and S2 within normal limits, no S3, no S4, no clicks, no rubs, II/VI systolic murmur at the RUSB and III/VI holosystolic murmur  at the apex. ABD:  Flat, positive bowel sounds normal in frequency in pitch, no bruits, no rebound, no guarding, no midline pulsatile mass, no hepatomegaly, no splenomegaly EXT:  2 plus pulses throughout, no edema, no cyanosis no clubbing SKIN:  No rashes no nodules NEURO:  Cranial nerves II through XII grossly intact, motor grossly intact throughout PSYCH:  Cognitively intact, oriented to person place and time   EKG:  EKG is not ordered today. The ekg ordered 05/21/16 demonstrates atrial fibrillateion.  Prior septal infarct.  Rate 60 bpm.  Unchanged from 10/22/15.  Echo 06/07/16:  Study Conclusions  - Left ventricle: The cavity size was normal. Wall thickness was   normal. Systolic function was vigorous. The estimated ejection   fraction was in the range of 65% to 70%. - Mitral valve: MV is thickened. with some restricted motion Peak   and mean gradients through the valve are 22 and 8 mm Hg   respectively MVA by  Pt1/2 is 1.9 cm2. Calcified annulus. Mildly   thickened leaflets . There was moderate to severe regurgitation. - Left atrium: The atrium was massively dilated. - Right atrium: The atrium was mildly dilated.   Echo 03/12/15: Study Conclusions  - Left ventricle: The cavity size was normal. Wall thickness was   increased in a pattern of moderate LVH. Systolic function was   normal. The estimated ejection fraction was in the range of 60%   to 65%. Wall motion was normal; there were no regional wall   motion abnormalities. - Mitral valve: Calcified annulus. Mildly thickened leaflets .   There was moderate to severe regurgitation. - Left atrium: Severely dilated at 75 ml/m2. - Right atrium: The atrium was mildly dilated. - Tricuspid valve: There was moderate regurgitation. - Pulmonary arteries: PA peak pressure: 41 mm Hg (S). - Inferior vena cava: The vessel was dilated. The respirophasic   diameter changes were blunted (< 50%), consistent with elevated   central venous  pressure.  Impressions:  - Compared to the prior echo in 2015, there are few changes. The   left atrium now measures 75 ml/m2. There is persistent moderate   to severe mitral regurgitation.   Recent Labs: 09/08/2015: B Natriuretic Peptide 130.5 05/21/2016: ALT 21; BUN 16; Creat 0.80; Hemoglobin 14.5; Platelets 241; Potassium 3.4; Sodium 146    Lipid Panel    Component Value Date/Time   CHOL 260 (H) 05/21/2016 1230   TRIG 161 (H) 05/21/2016 1230   HDL 71 05/21/2016 1230   CHOLHDL 3.7 05/21/2016 1230   VLDL 32 (H) 05/21/2016 1230   LDLCALC 157 (H) 05/21/2016 1230   LDLDIRECT 134.7 08/28/2014 1009      Wt Readings from Last 3 Encounters:  06/14/16 149 lb (67.6 kg)  05/21/16 149 lb 3.2 oz (67.7 kg)  03/01/16 152 lb 12.8 oz (69.3 kg)      ASSESSMENT AND PLAN:  # Moderate to severe MR:  # Moderate mitral regurgitation:  # Rheumatic mitral valve disease: # Shortness of breath: Ms. Jeanphilippe has moderate mitral stenosis and moderate to severe mitral regurgitation. She is not have any enlargement of her left ventricle in her ventricular function is normal. She does not have pulmonary hypertension. We will obtain a transesophageal echo to better evaluate her mitral valve and ensure that she does not actually have severe valvular heart disease.  If this is the case, she would like to be referred to Dr. Servando Snare for surgery.  If her valvular heart disease is not severe, we will refer her for stress testing versus cardiac catheterization.  # Persistent Atrial fibrillation: Rates remain well-controlled today. However she reports increased palpitations at home. Given that her heart rate is low today we will not titrate her beta blocker at this time. Continue atenolol and warfarin.   # Hypertension:  Blood pressure is well-controlled on atenolol and triamterene-HCTZ.  # CV Disease Prevention: Lipids are elevated but she has not tolerated statins in the past.  Continues Zetia.  If she is  found to have coronary artery disease on either stress or cath we will work aggressively with her cholesterol management.    Current medicines are reviewed at length with the patient today.  The patient does not have concerns regarding medicines.  The following changes have been made:  no change  Labs/ tests ordered today include:   Orders Placed This Encounter  Procedures  . Procedural/ Surgical Case Request: TRANSESOPHAGEAL ECHOCARDIOGRAM (TEE)  . CBC with Differential/Platelet  . Basic  metabolic panel  . INR/PT   Time spent: 45 minutes-Greater than 50% of this time was spent in counseling, explanation of diagnosis, planning of further management, and coordination of care.   Disposition:   FU with Ilijah Doucet C. Oval Linsey, MD, [facility] in 2 month.    This note was written with the assistance of speech recognition software.  Please excuse any transcriptional errors.  Signed, Kolleen Ochsner C. Oval Linsey, MD, [facility]  06/14/2016 6:16 PM    [facility]

## 2016-07-05 NOTE — Interval H&P Note (Signed)
History and Physical Interval Note:  07/05/2016 12:57 PM  Catherine [name]  has presented today for surgery, with the diagnosis of Afib  The various methods of treatment have been discussed with the patient and family. After consideration of risks, benefits and other options for treatment, the patient has consented to  Procedure(s): TRANSESOPHAGEAL ECHOCARDIOGRAM (TEE) (N/A) as a surgical intervention .  The patient's history has been reviewed, patient examined, no change in status, stable for surgery.  I have reviewed the patient's chart and labs.  Questions were answered to the patient's satisfaction.     [name], MD

## 2016-07-06 ENCOUNTER — Encounter (HOSPITAL_COMMUNITY): Payer: Self-pay | Admitting: Cardiovascular Disease

## 2016-07-06 ENCOUNTER — Telehealth: Payer: Self-pay | Admitting: *Deleted

## 2016-07-06 ENCOUNTER — Telehealth: Payer: Self-pay | Admitting: Cardiovascular Disease

## 2016-07-06 DIAGNOSIS — I340 Nonrheumatic mitral (valve) insufficiency: Secondary | ICD-10-CM

## 2016-07-06 DIAGNOSIS — R0602 Shortness of breath: Secondary | ICD-10-CM

## 2016-07-06 NOTE — Telephone Encounter (Signed)
Mrs. Catherine [name] had a TEE with moderate mitral stenosis and moderate mitral regurgitation.  She continues to have significant exertional symptoms.  We will obtain an exercise stress echo primarily to better evaluate her valve, and to look for ischemia secondarily.  We will ask that this echo be performed by [place].  We will also ask that she see Dr. [name], her pulmonologist to determine whether pulmonary disease could be contributing to her symptoms.   Felisha Claytor C. Oval Linsey, MD, [facility]  07/06/2016 10:29 AM

## 2016-07-06 NOTE — Telephone Encounter (Signed)
Cardiology is managing her K (see recent lab note from Dr Oval Linsey).  She is apparently still taking one full Valium and do not want to make any changes this close to surgery .  Refill #30 with one refill but set up follow up soon to discuss.

## 2016-07-06 NOTE — Telephone Encounter (Signed)
Mrs. Catherine [name] had a TEE with moderate mitral stenosis and moderate mitral regurgitation.  She continues to have significant exertional symptoms.  We will obtain an exercise stress echo primarily to better evaluate her valve, and to look for ischemia secondarily.  We will ask that this echo be performed by Catherine [name].  We will also ask that she see Dr. [name], her pulmonologist to determine whether pulmonary disease could be contributing to her symptoms.   Catherine C. Oval Linsey, MD, [facility]  07/06/2016 10:29 AM  Advised patient

## 2016-07-06 NOTE — Telephone Encounter (Signed)
Order placed for stress echo to evaluate shortness of breath with exertion  Patient aware scheduling will be calling

## 2016-07-07 MED ORDER — DIAZEPAM 5 MG PO TABS: 2.5000 mg | ORAL_TABLET | Freq: Every day | ORAL | 0 refills | Status: DC | PRN

## 2016-07-07 NOTE — Telephone Encounter (Signed)
Medication verbally called in for patient. 

## 2016-07-07 NOTE — Addendum Note (Signed)
Addended by: Elio Forget on: 07/07/2016 12:59 PM   Modules accepted: Orders

## 2016-07-12 ENCOUNTER — Ambulatory Visit (INDEPENDENT_AMBULATORY_CARE_PROVIDER_SITE_OTHER): Payer: Medicare Other | Admitting: Family Medicine

## 2016-07-12 VITALS — BP 140/88 | HR 63 | Temp 98.3°F | Ht 64.0 in | Wt 144.4 lb

## 2016-07-12 DIAGNOSIS — F5104 Psychophysiologic insomnia: Secondary | ICD-10-CM | POA: Diagnosis not present

## 2016-07-12 DIAGNOSIS — R8290 Unspecified abnormal findings in urine: Secondary | ICD-10-CM

## 2016-07-12 DIAGNOSIS — Z23 Encounter for immunization: Secondary | ICD-10-CM

## 2016-07-12 DIAGNOSIS — E785 Hyperlipidemia, unspecified: Secondary | ICD-10-CM

## 2016-07-12 DIAGNOSIS — E876 Hypokalemia: Secondary | ICD-10-CM

## 2016-07-12 LAB — BASIC METABOLIC PANEL
BUN: 14 mg/dL (ref 6–23)
CHLORIDE: 100 meq/L (ref 96–112)
CO2: 32 mEq/L (ref 19–32)
Calcium: 10.2 mg/dL (ref 8.4–10.5)
Creatinine, Ser: 0.84 mg/dL (ref 0.40–1.20)
GFR: 69.51 mL/min (ref >60.00)
GLUCOSE: 94 mg/dL (ref 70–99)
POTASSIUM: 3.3 meq/L — AB (ref 3.5–5.1)
SODIUM: 144 meq/L (ref 135–145)

## 2016-07-12 LAB — POCT URINALYSIS DIPSTICK
Bilirubin, UA: NEGATIVE
Blood, UA: NEGATIVE
GLUCOSE UA: NEGATIVE
Ketones, UA: NEGATIVE
LEUKOCYTES UA: NEGATIVE
NITRITE UA: NEGATIVE
Protein, UA: NEGATIVE
Spec Grav, UA: 1.015
UROBILINOGEN UA: 0.2
pH, UA: 7

## 2016-07-12 LAB — LIPID PANEL
Cholesterol: 217 mg/dL — ABNORMAL HIGH (ref 0–200)
HDL: 59.1 mg/dL (ref >39.00)
LDL Cholesterol: 132 mg/dL — ABNORMAL HIGH (ref 0–99)
NONHDL: 158.29
Total CHOL/HDL Ratio: 4
Triglycerides: 130 mg/dL (ref 0.0–149.0)
VLDL: 26 mg/dL (ref 0.0–40.0)

## 2016-07-12 MED ORDER — DIAZEPAM 5 MG PO TABS: 5.0000 mg | ORAL_TABLET | Freq: Every day | ORAL | 5 refills | Status: DC

## 2016-07-12 MED ORDER — POTASSIUM CHLORIDE ER 10 MEQ PO TBCR: 80.0000 meq | EXTENDED_RELEASE_TABLET | Freq: Once | ORAL | 1 refills | Status: DC

## 2016-07-12 NOTE — Progress Notes (Signed)
Subjective:     Patient ID: Catherine [name], female   DOB: 06/17/1935, 81 y.o.   MRN: [id number]  HPI Patient seen with multiple issues as below  Two day history of foul odor to urine. No burning with urination. No fevers or chills. History of frequent UTI in the past  Low potassium. Recent potassium 3.4. She takes Maxzide and has been on potassium supplement for many years. Cardiologist recently increased her dosage to 8 tablets daily  Chronic insomnia for many years. She has history of peripheral neuropathy and apparently did not tolerate Lyrica or gabapentin. She's been on dosage of diazepam 5 mg daily at bedtime for many years. We recently tried tapering her back 2.5 but she did not do well with this. She is requesting refills. No history of issues.  She's had some nonspecific fatigue. She has nonrheumatic mitral valve disorder followed by cardiology. Stress echo pending.  Hyperlipidemia. Recent lipids reviewed. She was placed in August on Zetia per cardiology. She's been unable to tolerate statins in the past  Past Medical History:  Diagnosis Date  . Arthritis   . Asthma    last attack 02/2015  . Atrial fibrillation, chronic ([place])   . Chronic anticoagulation   . Chronic kidney disease    "RIGHT MANY KIDNEY INFECTIONS AND STONES"  . COPD (chronic obstructive pulmonary disease) ([place])   . H/O: rheumatic fever   . Heart murmur   . Hypertension   . PONV (postoperative nausea and vomiting)    ' SOMETIMES', BUT NOT ALWAYS"  . UTI (urinary tract infection)   . Valvular heart disease    Has mitral stenosis with prior mitral commissurotomy in 1970   Past Surgical History:  Procedure Laterality Date  . ABDOMINAL HYSTERECTOMY  1983   endometriosis  . BACK SURGERY     neurosurgery  . CARDIAC CATHETERIZATION    . cataract surg    . CHOLECYSTECTOMY N/A 05/30/2015   Procedure: LAPAROSCOPIC CHOLECYSTECTOMY WITH INTRAOPERATIVE CHOLANGIOGRAM;  Surgeon: Excell Seltzer, MD;  Location: [place];  Service: General;  Laterality: N/A;  . commissurotomy  1970  . EYE SURGERY    . OTHER SURGICAL HISTORY     hysterectomy  . TEE WITHOUT CARDIOVERSION N/A 07/05/2016   Procedure: TRANSESOPHAGEAL ECHOCARDIOGRAM (TEE);  Surgeon: [name], MD;  Location: [facility] ENDOSCOPY;  Service: Cardiovascular;  Laterality: N/A;    reports that she quit smoking about 40 years ago. Her smoking use included Cigarettes. She has a 15.00 pack-year smoking history. She has never used smokeless tobacco. She reports that she does not drink alcohol or use drugs. family history includes Leukemia in her father. Allergies  Allergen Reactions  . Aldactone [Spironolactone] Other (See Comments)    dyspnea  . Amoxicillin Palpitations    tachycardia  . Diltiazem Other (See Comments)    Causing headaches   . Flagyl [Metronidazole Hcl] Other (See Comments)    Causing headaches    . Flovent [Fluticasone Propionate] Other (See Comments)    Leg cramps  . Gabapentin Swelling  . Lyrica [Pregabalin] Swelling  . Quinidine Diarrhea and Other (See Comments)    Fever diarrhea  . Simvastatin Other (See Comments)    Leg pain, myalgia  . Tramadol Nausea Only  . Verapamil Other (See Comments)    myalgias  . Ace Inhibitors Other (See Comments)    unknown  . Benazepril Hcl Cough  . Ciprocin-Fluocin-Procin [Fluocinolone Acetonide] Other (See Comments)    unknown  . Ciprofloxacin Diarrhea  . Codeine  Nausea Only  . Nitrofurantoin Monohyd Macro Nausea Only     Review of Systems  Constitutional: Positive for fatigue.  Eyes: Negative for visual disturbance.  Respiratory: Negative for cough, chest tightness and wheezing.   Cardiovascular: Negative for chest pain, palpitations and leg swelling.  Genitourinary: Positive for dysuria.  Neurological: Negative for dizziness, seizures, syncope, weakness, light-headedness and headaches.       Objective:   Physical Exam  Constitutional: She appears well-developed and  well-nourished.  Neck: Neck supple. No thyromegaly present.  Cardiovascular: Normal rate and regular rhythm.   Pulmonary/Chest: Effort normal and breath sounds normal. No respiratory distress. She has no wheezes. She has no rales.  Musculoskeletal: She exhibits no edema.  Psychiatric: She has a normal mood and affect. Her behavior is normal.       Assessment:     #1 chronic insomnia-we tried tapering back her diazepam recently but she did not tolerate  #2 hypokalemia  #3 Dysuria. Urine dipstick reveals no evidence for urine infection  #4 hyperlipidemia    Plan:     -Flu vaccine given -Check follow-up labs with basic metabolic panel and lipid panel -Refill diazepam 5 mg daily at bedtime for one year. We discussed risk of this especially in elderly but she was unable tolerate tapering -fall prevention discussed.  [name] MD [facility]

## 2016-07-12 NOTE — Progress Notes (Signed)
Pre visit review using our clinic review tool, if applicable. No additional management support is needed unless otherwise documented below in the visit note. 

## 2016-07-13 ENCOUNTER — Other Ambulatory Visit: Payer: Self-pay

## 2016-07-13 DIAGNOSIS — E876 Hypokalemia: Secondary | ICD-10-CM

## 2016-07-22 ENCOUNTER — Telehealth (HOSPITAL_COMMUNITY): Payer: Self-pay | Admitting: *Deleted

## 2016-07-22 NOTE — Telephone Encounter (Signed)
Patient given detailed instructions per Stress Test Requisition Sheet for test on 07/27/16 at 7:30.Patient Notified to arrive 30 minutes early, and that it is imperative to arrive on time for appointment to keep from having the test rescheduled.  Patient verbalized understanding. Veronia Beets

## 2016-07-27 ENCOUNTER — Ambulatory Visit (HOSPITAL_BASED_OUTPATIENT_CLINIC_OR_DEPARTMENT_OTHER): Payer: Medicare Other

## 2016-07-27 ENCOUNTER — Ambulatory Visit (HOSPITAL_COMMUNITY): Payer: Medicare Other | Attending: Cardiology

## 2016-07-27 DIAGNOSIS — I340 Nonrheumatic mitral (valve) insufficiency: Secondary | ICD-10-CM | POA: Insufficient documentation

## 2016-07-27 DIAGNOSIS — I4891 Unspecified atrial fibrillation: Secondary | ICD-10-CM | POA: Diagnosis not present

## 2016-07-27 DIAGNOSIS — R0989 Other specified symptoms and signs involving the circulatory and respiratory systems: Secondary | ICD-10-CM

## 2016-07-27 DIAGNOSIS — R0602 Shortness of breath: Secondary | ICD-10-CM | POA: Diagnosis not present

## 2016-07-28 ENCOUNTER — Telehealth: Payer: Self-pay | Admitting: *Deleted

## 2016-07-28 DIAGNOSIS — Z01818 Encounter for other preprocedural examination: Secondary | ICD-10-CM

## 2016-07-28 DIAGNOSIS — D689 Coagulation defect, unspecified: Secondary | ICD-10-CM | POA: Diagnosis not present

## 2016-07-28 DIAGNOSIS — Z7901 Long term (current) use of anticoagulants: Secondary | ICD-10-CM | POA: Diagnosis not present

## 2016-07-28 NOTE — Telephone Encounter (Signed)
Patient scheduled for cath with Dr Martinique 08-03-16 at 10:30 Patient aware of date and time  Message sent to Dr Oval Linsey for possible Lovenox bridging as patient states she has always been brdiged

## 2016-07-28 NOTE — Telephone Encounter (Signed)
-----   Message from [name], MD sent at 07/28/2016  3:35 PM EDT ----- Echo shows that her mitral regurgitation and mitral stenosis become severe with exertion.  This seems to be the cause of her symptoms.  It also showed EKG changes that are concerning she may have blockages with the heart arteries.  Suggest cardiac catheterization (L&R), both to evaluate her coronaries and to plan for valve replacement.  Please refer to Dr. Servando Snare or Dr. Roxy Manns after the heart cath.

## 2016-07-29 ENCOUNTER — Ambulatory Visit (INDEPENDENT_AMBULATORY_CARE_PROVIDER_SITE_OTHER)
Admission: RE | Admit: 2016-07-29 | Discharge: 2016-07-29 | Disposition: A | Discharge status: Home / Self Care | Payer: Medicare Other | Source: Ambulatory Visit | Attending: Internal Medicine | Admitting: Internal Medicine

## 2016-07-29 ENCOUNTER — Ambulatory Visit: Payer: Medicare Other

## 2016-07-29 ENCOUNTER — Ambulatory Visit (INDEPENDENT_AMBULATORY_CARE_PROVIDER_SITE_OTHER): Payer: Medicare Other | Admitting: Internal Medicine

## 2016-07-29 ENCOUNTER — Encounter: Payer: Self-pay | Admitting: Internal Medicine

## 2016-07-29 VITALS — BP 126/74 | HR 60 | Ht 64.0 in | Wt 151.2 lb

## 2016-07-29 DIAGNOSIS — J449 Chronic obstructive pulmonary disease, unspecified: Secondary | ICD-10-CM

## 2016-07-29 DIAGNOSIS — J439 Emphysema, unspecified: Secondary | ICD-10-CM | POA: Diagnosis not present

## 2016-07-29 LAB — BASIC METABOLIC PANEL
BUN: 13 mg/dL (ref 7–25)
CO2: 30 mmol/L (ref 20–31)
Calcium: 9.8 mg/dL (ref 8.6–10.4)
Chloride: 101 mmol/L (ref 98–110)
Creat: 0.79 mg/dL (ref 0.60–0.93)
GLUCOSE: 99 mg/dL (ref 65–99)
POTASSIUM: 3.3 mmol/L — AB (ref 3.5–5.3)
Sodium: 144 mmol/L (ref 135–146)

## 2016-07-29 LAB — CBC WITH DIFFERENTIAL/PLATELET
Basophils Absolute: 69 cells/uL (ref 0–200)
Basophils Relative: 1 %
Eosinophils Absolute: 69 cells/uL (ref 15–500)
Eosinophils Relative: 1 %
HEMATOCRIT: 42.6 % (ref 35.0–45.0)
HEMOGLOBIN: 13.9 g/dL (ref 11.7–15.5)
LYMPHS ABS: 1932 {cells}/uL (ref 850–3900)
Lymphocytes Relative: 28 %
MCH: 30.1 pg (ref 27.0–33.0)
MCHC: 32.6 g/dL (ref 32.0–36.0)
MCV: 92.2 fL (ref 80.0–100.0)
MONO ABS: 621 {cells}/uL (ref 200–950)
MPV: 9.1 fL (ref 7.5–12.5)
Monocytes Relative: 9 %
Neutro Abs: 4209 cells/uL (ref 1500–7800)
Neutrophils Relative %: 61 %
Platelets: 224 10*3/uL (ref 140–400)
RBC: 4.62 MIL/uL (ref 3.80–5.10)
RDW: 13.8 % (ref 11.0–15.0)
WBC: 6.9 10*3/uL (ref 3.8–10.8)

## 2016-07-29 LAB — PROTIME-INR
INR: 2.5 — ABNORMAL HIGH
Prothrombin Time: 25.8 s — ABNORMAL HIGH (ref 9.0–11.5)

## 2016-07-29 NOTE — Assessment & Plan Note (Addendum)
-   Quit smoking 1977 - Spirometry 03/24/15  FEV1  1.41 (70%) ratio 65  - 03/26/2015  Walked RA x 3 laps @ 185 ft each stopped due to  End of study, brisk pace, no sob or desat  - Spirometry 07/29/2016  FEV1 1.30 (66%)  Ratio 66    - The proper method of use, as well as anticipated side effects, of a metered-dose inhaler are discussed and demonstrated to the patient. Improved effectiveness after extensive coaching during this visit to a level of approximately 25 % from a baseline of 0 % ok to continue to use hfa but needs husbands feedback on whether doing it correctly     When respiratory symptoms begin or become refractory well after a patient reports complete smoking cessation,  Especially when this wasn't the case while they were smoking, a red flag is raised based on the work of Dr Kris Mouton which states:  if you quit smoking when your best day FEV1 is still well preserved it is highly unlikely you will progress to severe disease.  That is to say, once the smoking stops,  the symptoms should not suddenly erupt or markedly worsen.  If so, the differential diagnosis should include  obesity/deconditioning,  LPR/Reflux/Aspiration syndromes,  occult CHF, or  especially side effect of medications commonly used in this population.     She showed no capability at all to use symbicort correctly so I doubt she has much AB either but rec she stay on symbicort until after sort out her MV problems then try off the symbicort and just use hfa prn  For now would focus on the Mitral valve issue - she is a reasonable candidate for MVR if necessary though would carry additional risks of pulmonary complication/ they would not be prohibitive per se   Total time devoted to counseling  = 35/76m review case with pt/ discussion of options/alternatives/ personally creating written instructions  in presence of pt  then going over those specific  Instructions directly with the pt including how to use all of the meds but in  particular covering each new medication in detail and the difference between the maintenance/automatic meds and the prns using an action plan format for the latter.

## 2016-07-29 NOTE — Patient Instructions (Addendum)
Plan A = Automatic = Symbicort 80 Take 2 puffs first thing in am and then another 2 puffs about 12 hours later.   Work on inhaler technique:  relax and gently blow all the way out then take a nice smooth deep breath back in, triggering the inhaler at same time you start breathing in.  Hold for up to 5 seconds if you can. Blow out thru nose. Rinse and gargle with water when done - late add  If really needs hfa may be required to use spacer as could never trigger the mdi at onset of insp, only > 1 sec prior   Plan B = Backup Only use your albuterol as a rescue medication to be used if you can't catch your breath by resting or doing a relaxed purse lip breathing pattern.  - The less you use it, the better it will work when you need it. - Ok to use the inhaler up to 2 puffs  every 4 hours if you must but call for appointment if use goes up over your usual need - Don't leave home without it !!  (think of it like the spare tire for your car)   Please remember to go to the x-ray department downstairs for your tests - we will call you with the results when they are available.      Your lung function has not changed since your previous study and therefore it more likely than not the problem with your breathing is due to the heart valve.     If you are satisfied with your treatment plan,  let your doctor know and he/she can either refill your medications or you can return here when your prescription runs out.     If in any way you are not 100% satisfied,  please tell us.  If 100% better, tell your friends!  Pulmonary follow up is as needed

## 2016-07-29 NOTE — Progress Notes (Signed)
Subjective:     Patient ID: Catherine [name], female   DOB: Jan 30, 1935,   MRN: [id number]    Brief patient profile:  69 yowf quit smoking in 1977 at her best can do 5day/7 walk x one half mile 3.2 mph flat but not doing well spring 2016 then admitted p flare of copd with new kidney stone/GB pain    Admit date: 03/15/2015 Discharge date: 03/17/2015    Discharge Condition: stable Diet recommendation: low fat, low sodium  Discharge Diagnoses:  Principal Problem:  Left flank pain/ Nephrolithiasis  Right upper quadrant pain-chronic cholecystitis  Essential hypertension  Atrial fibrillation, chronic  Warfarin-induced coagulopathy  Valvular heart disease  COPD (chronic obstructive pulmonary disease)  History of present illness:  -81 year old female who was discharged from the hospital on 6/17 after being treated for a COPD exacerbation. She returned to the hospital on 6/18 with severe left-sided flank pain. The pain eventually moved to left anterior abdomen and then the groin and then resolved. A CT scan of the abdomen and pelvis was done which revealed a forniceal rupture and a 3 mm calculus at the left UV junction or just inside the bladder. This was consistent with her presentation and at this time she has likely passed the stone. The CT scan above incidentally found chronic large gallstone. Further imaging with the ultrasound revealed acute cholecystitis with a 2.6 cm gallstone and possible additional stones. The patient is admitted to intermittent right upper quadrant pain but no significant issues with vomiting or diarrhea.  Hospital Course:  Principal Problem:  Acute cholecystitis? -As mentioned above, CT scan revealed a large gallstone and ultrasound was suspicious of acute cholecystitis -HIDA negative for acute cholecystitis therefore she does not currently have acute cholecystitis however surgery does suspect she has a chronic cholecystitis as she does complain of having  intermittent right upper quadrant pain for the past couple of months -Surgery (Dr Excell Seltzer) recommending surgery at a later date for chronic cholecystitis and is asking for medical clearance for surgery  Active Problems: Left sided nephrolithiasis with forniceal rupture-discussed with urology -no further management necessary as her pain has resolved indicating passage of the stone  COPD flare -Was admitted for this and discharged on the 17th -has not been hypoxic or wheezing during the hospital stay but continues to have a cough with yellow sputum   Essential hypertension -Atenolol and Maxzide held on admission as she was bradycardic and has acute renal failure - ARF resolved- can resume Losartan   Atrial fibrillation, chronic -she was bradycardic-Atenolol was on hold - HR was in 50-60 range- had a-fib with HR in low 100s for almost 5 min this AM- will resume Atenolol at 12.5 mg daily- cont to hold Digoxin   Warfarin-induced coagulopathy -CHADS Vasc 4 -INR 3.57 on admission - Coumadin was hold for possible surgery- INR 1.49- no surgery planned for this admission- can go back on coumadin takes 2 mg on add days except Wed when she takes 1mg - advised to go back to 2 mg daily and have INR checked in 3 days by PCP   Moderate to severe mitral regurgitation -Unchanged from prior echo in 2015   03/24/2015 1st [facility] office visit/ [name] / pre op consultation  Chief Complaint  Patient presents with  . HFU    Breathing has improved. Her cough has improved some, but she still occ coughs up yellow sputum.   at baseline using saba twice weekly then yardwrok on   03/07/15  then hackiing  6/11 and admit   03/11/15 with copd/ab  Still some am cough but otherwise back to baseline on symb 80 2bid  Not limited by breathing from desired activities  rec Continue symbicort 80 Take 2 puffs first thing in am and then another 2 puffs about 12 hours later indefinitely since you had an attack and  proair didn't help Only use your albuterol (proair) as a rescue medication You are cleared for gall bladder surgery > did  Fine     07/29/2016  Pulmonary consultation /Aengus Sauceda re: unexplained sob in pt with MS Chief Complaint  Patient presents with  . Pulmonary Consult    Referred by Dr. Oval Linsey. Pt states that she is going to have mitral valve replacement soon. She c/o increased SOB since her last visit in 2016. She gets SOB doing housework.     progressive doe x year not able to trigger hfa effectively (at least a one second delay)  Previously able to do half mile on a treadmill  X  3.5 mph and flat now  2.2 x 0.2 of a mile then stops due to sob occ orthopnea/ maybe once a month Always recovers within a few minutes    No obvious day to day or daytime variability or assoc excess/ purulent sputum or mucus plugs or hemoptysis or cp or chest tightness, subjective wheeze or overt sinus or hb symptoms. No unusual exp hx or h/o childhood pna/ asthma or knowledge of premature birth.  Sleeping ok without nocturnal  or early am exacerbation  of respiratory  c/o's or need for noct saba. Also denies any obvious fluctuation of symptoms with weather or environmental changes or other aggravating or alleviating factors except as outlined above   Current Medications, Allergies, Complete Past Medical History, Past Surgical History, Family History, and Social History were reviewed in [employer] record.  ROS  The following are not active complaints unless bolded sore throat, dysphagia, dental problems, itching, sneezing,  nasal congestion or excess/ purulent secretions, ear ache,   fever, chills, sweats, unintended wt loss, classically pleuritic or exertional cp,  orthopnea pnd or leg swelling, presyncope, palpitations, abdominal pain, anorexia, nausea, vomiting, diarrhea  or change in bowel or bladder habits, change in stools or urine, dysuria,hematuria,  rash, arthralgias, visual  complaints, headache, numbness, weakness or ataxia or problems with walking or coordination,  change in mood/affect or memory.                 Objective:   Physical Exam   amb   wf nad   07/29/2016       151  03/24/15 139 lb 12.8 oz (63.413 kg)  03/20/15 141 lb (63.957 kg)  03/16/15 143 lb (64.864 kg)    Vital signs reviewed - Note on arrival 02 sats  96% on RA     HEENT: nl dentition, turbinates, and orophanx. Nl external ear canals without cough reflex   NECK :  without JVD/Nodes/TM/ nl carotid upstrokes bilaterally   LUNGS: no acc muscle use, clear to A and P bilaterally without cough on insp or exp maneuvers   CV:  RRR  no s3 or murmur or increase in P2, no edema   ABD:  soft and nontender with nl excursion in the supine position. No bruits or organomegaly, bowel sounds nl  MS:  warm without deformities, calf tenderness, cyanosis or clubbing  SKIN: warm and dry without lesions    NEURO:  alert, approp, no deficits   CXR PA  and Lateral:   07/29/2016 :    I personally reviewed images and agree with radiology impression as follows:    No acute cardiopulmonary disease.        Assessment:

## 2016-07-30 MED ORDER — ENOXAPARIN SODIUM 100 MG/ML ~~LOC~~ SOLN: 100.0000 mg | SUBCUTANEOUS | 0 refills | Status: DC

## 2016-07-30 NOTE — Progress Notes (Signed)
Spoke with pt and notified of results per Dr. Wert. Pt verbalized understanding and denied any questions. 

## 2016-07-30 NOTE — Telephone Encounter (Signed)
Reasonable to bridge with Lovenox per Dr Oval Linsey Discussed Lovenox dose with Jacklynn Ganong D and will Rx Lovenox 100 mg once daily 11/4, 11/5, and 11/8 Hold Warfarin starting today, resume 11/7 unless told otherwise by MD doing cardiac cath Will need PT/INR 7-10 days after resuming Warfarin Advised patient, verbalized understanding

## 2016-08-02 ENCOUNTER — Other Ambulatory Visit: Payer: Self-pay | Admitting: Cardiovascular Disease

## 2016-08-03 ENCOUNTER — Other Ambulatory Visit: Payer: Self-pay

## 2016-08-03 ENCOUNTER — Encounter (HOSPITAL_COMMUNITY)
Admission: RE | Disposition: A | Discharge status: Home / Self Care | Payer: Self-pay | Source: Ambulatory Visit | Attending: Cardiology

## 2016-08-03 ENCOUNTER — Ambulatory Visit (HOSPITAL_COMMUNITY)
Admission: RE | Admit: 2016-08-03 | Discharge: 2016-08-03 | Disposition: A | Discharge status: Home / Self Care | Payer: Medicare Other | Source: Ambulatory Visit | Attending: Cardiology | Admitting: Cardiology

## 2016-08-03 DIAGNOSIS — I4891 Unspecified atrial fibrillation: Secondary | ICD-10-CM | POA: Diagnosis present

## 2016-08-03 DIAGNOSIS — Z87891 Personal history of nicotine dependence: Secondary | ICD-10-CM | POA: Insufficient documentation

## 2016-08-03 DIAGNOSIS — I129 Hypertensive chronic kidney disease with stage 1 through stage 4 chronic kidney disease, or unspecified chronic kidney disease: Secondary | ICD-10-CM | POA: Diagnosis not present

## 2016-08-03 DIAGNOSIS — I099 Rheumatic heart disease, unspecified: Secondary | ICD-10-CM | POA: Diagnosis present

## 2016-08-03 DIAGNOSIS — I050 Rheumatic mitral stenosis: Secondary | ICD-10-CM | POA: Diagnosis present

## 2016-08-03 DIAGNOSIS — I2510 Atherosclerotic heart disease of native coronary artery without angina pectoris: Secondary | ICD-10-CM | POA: Insufficient documentation

## 2016-08-03 DIAGNOSIS — I481 Persistent atrial fibrillation: Secondary | ICD-10-CM | POA: Diagnosis not present

## 2016-08-03 DIAGNOSIS — Z881 Allergy status to other antibiotic agents status: Secondary | ICD-10-CM | POA: Insufficient documentation

## 2016-08-03 DIAGNOSIS — Z885 Allergy status to narcotic agent status: Secondary | ICD-10-CM | POA: Insufficient documentation

## 2016-08-03 DIAGNOSIS — I272 Pulmonary hypertension, unspecified: Secondary | ICD-10-CM | POA: Diagnosis not present

## 2016-08-03 DIAGNOSIS — N189 Chronic kidney disease, unspecified: Secondary | ICD-10-CM | POA: Diagnosis not present

## 2016-08-03 DIAGNOSIS — J449 Chronic obstructive pulmonary disease, unspecified: Secondary | ICD-10-CM | POA: Insufficient documentation

## 2016-08-03 DIAGNOSIS — I052 Rheumatic mitral stenosis with insufficiency: Secondary | ICD-10-CM | POA: Insufficient documentation

## 2016-08-03 DIAGNOSIS — I051 Rheumatic mitral insufficiency: Secondary | ICD-10-CM | POA: Diagnosis present

## 2016-08-03 DIAGNOSIS — Z888 Allergy status to other drugs, medicaments and biological substances status: Secondary | ICD-10-CM | POA: Insufficient documentation

## 2016-08-03 DIAGNOSIS — I340 Nonrheumatic mitral (valve) insufficiency: Secondary | ICD-10-CM | POA: Diagnosis present

## 2016-08-03 HISTORY — PX: CARDIAC CATHETERIZATION: SHX172

## 2016-08-03 LAB — POCT I-STAT 3, ART BLOOD GAS (G3+)
Acid-Base Excess: 3 mmol/L — ABNORMAL HIGH (ref 0.0–2.0)
Bicarbonate: 28.3 mmol/L — ABNORMAL HIGH (ref 20.0–28.0)
O2 SAT: 92 %
PCO2 ART: 45.4 mmHg (ref 32.0–48.0)
PH ART: 7.403 (ref 7.350–7.450)
PO2 ART: 63 mmHg — AB (ref 83.0–108.0)
TCO2: 30 mmol/L (ref 0–100)

## 2016-08-03 LAB — POCT I-STAT 3, VENOUS BLOOD GAS (G3P V)
ACID-BASE EXCESS: 4 mmol/L — AB (ref 0.0–2.0)
Bicarbonate: 29.6 mmol/L — ABNORMAL HIGH (ref 20.0–28.0)
O2 Saturation: 61 %
PCO2 VEN: 49 mmHg (ref 44.0–60.0)
PH VEN: 7.389 (ref 7.250–7.430)
PO2 VEN: 33 mmHg (ref 32.0–45.0)
TCO2: 31 mmol/L (ref 0–100)

## 2016-08-03 LAB — POCT I-STAT, CHEM 8
BUN: 13 mg/dL (ref 6–20)
CHLORIDE: 102 mmol/L (ref 101–111)
Calcium, Ion: 1.2 mmol/L (ref 1.15–1.40)
Creatinine, Ser: 0.7 mg/dL (ref 0.44–1.00)
Glucose, Bld: 98 mg/dL (ref 65–99)
HEMATOCRIT: 36 % (ref 36.0–46.0)
Hemoglobin: 12.2 g/dL (ref 12.0–15.0)
Potassium: 3.5 mmol/L (ref 3.5–5.1)
SODIUM: 143 mmol/L (ref 135–145)
TCO2: 28 mmol/L (ref 0–100)

## 2016-08-03 LAB — PROTIME-INR
INR: 1.23
PROTHROMBIN TIME: 15.6 s — AB (ref 11.4–15.2)

## 2016-08-03 SURGERY — RIGHT/LEFT HEART CATH AND CORONARY ANGIOGRAPHY
Anesthesia: LOCAL

## 2016-08-03 MED ORDER — HEPARIN (PORCINE) IN NACL 2-0.9 UNIT/ML-% IJ SOLN
INTRAMUSCULAR | Status: DC | PRN
  Administered 2016-08-03: 10 mL via INTRA_ARTERIAL

## 2016-08-03 MED ORDER — LIDOCAINE HCL (PF) 1 % IJ SOLN
INTRAMUSCULAR | Status: DC | PRN
  Administered 2016-08-03: 1 mL via INTRADERMAL
  Administered 2016-08-03: 15 mL via SUBCUTANEOUS
  Administered 2016-08-03: 2 mL via INTRADERMAL

## 2016-08-03 MED ORDER — HEPARIN (PORCINE) IN NACL 2-0.9 UNIT/ML-% IJ SOLN
INTRAMUSCULAR | Status: AC
  Filled 2016-08-03: qty 1000

## 2016-08-03 MED ORDER — ASPIRIN 81 MG PO CHEW
CHEWABLE_TABLET | ORAL | Status: AC
  Administered 2016-08-03: 81 mg via ORAL
  Filled 2016-08-03: qty 1

## 2016-08-03 MED ORDER — SODIUM CHLORIDE 0.9 % WEIGHT BASED INFUSION
3.0000 mL/kg/h | INTRAVENOUS | Status: DC
Start: 2016-08-04 — End: 2016-08-03
  Administered 2016-08-03: 3 mL/kg/h via INTRAVENOUS

## 2016-08-03 MED ORDER — HEPARIN (PORCINE) IN NACL 2-0.9 UNIT/ML-% IJ SOLN
INTRAMUSCULAR | Status: DC | PRN
  Administered 2016-08-03: 1000 mL

## 2016-08-03 MED ORDER — SODIUM CHLORIDE 0.9 % IV SOLN: 250.0000 mL | INTRAVENOUS | Status: DC | PRN

## 2016-08-03 MED ORDER — IOPAMIDOL (ISOVUE-370) INJECTION 76%
INTRAVENOUS | Status: DC | PRN
  Administered 2016-08-03: 70 mL via INTRA_ARTERIAL

## 2016-08-03 MED ORDER — IOPAMIDOL (ISOVUE-370) INJECTION 76%
INTRAVENOUS | Status: AC
  Filled 2016-08-03: qty 100

## 2016-08-03 MED ORDER — SODIUM CHLORIDE 0.9 % WEIGHT BASED INFUSION: 1.0000 mL/kg/h | INTRAVENOUS | Status: AC

## 2016-08-03 MED ORDER — VERAPAMIL HCL 2.5 MG/ML IV SOLN
INTRAVENOUS | Status: AC
  Filled 2016-08-03: qty 2

## 2016-08-03 MED ORDER — SODIUM CHLORIDE 0.9% FLUSH: 3.0000 mL | Freq: Two times a day (BID) | INTRAVENOUS | Status: DC

## 2016-08-03 MED ORDER — SODIUM CHLORIDE 0.9% FLUSH: 3.0000 mL | INTRAVENOUS | Status: DC | PRN

## 2016-08-03 MED ORDER — FENTANYL CITRATE (PF) 100 MCG/2ML IJ SOLN
INTRAMUSCULAR | Status: DC | PRN
Start: 2016-08-03 — End: 2016-08-03
  Administered 2016-08-03: 25 ug via INTRAVENOUS

## 2016-08-03 MED ORDER — LIDOCAINE HCL (PF) 1 % IJ SOLN
INTRAMUSCULAR | Status: AC
  Filled 2016-08-03: qty 30

## 2016-08-03 MED ORDER — ASPIRIN 81 MG PO CHEW
81.0000 mg | CHEWABLE_TABLET | ORAL | Status: AC
  Administered 2016-08-03: 81 mg via ORAL

## 2016-08-03 MED ORDER — MIDAZOLAM HCL 2 MG/2ML IJ SOLN
INTRAMUSCULAR | Status: DC | PRN
  Administered 2016-08-03: 1 mg via INTRAVENOUS

## 2016-08-03 MED ORDER — FENTANYL CITRATE (PF) 100 MCG/2ML IJ SOLN
INTRAMUSCULAR | Status: AC
  Filled 2016-08-03: qty 2

## 2016-08-03 MED ORDER — SODIUM CHLORIDE 0.9 % WEIGHT BASED INFUSION: 1.0000 mL/kg/h | INTRAVENOUS | Status: DC

## 2016-08-03 MED ORDER — MIDAZOLAM HCL 2 MG/2ML IJ SOLN
INTRAMUSCULAR | Status: AC
  Filled 2016-08-03: qty 2

## 2016-08-03 SURGICAL SUPPLY — 15 items
CATH BALLN WEDGE 5F 110CM (CATHETERS) ×2
CATH INFINITI 5FR ANG PIGTAIL (CATHETERS) ×2
CATH INFINITI JR4 5F (CATHETERS) ×2
CATH INFINITI MULTIPACK ST 5F (CATHETERS) ×2
DEVICE RAD COMP TR BAND LRG (VASCULAR PRODUCTS) ×2
GLIDESHEATH SLEND SS 6F .021 (SHEATH) ×2
KIT HEART LEFT (KITS) ×2
PACK CARDIAC CATHETERIZATION (CUSTOM PROCEDURE TRAY) ×2
SHEATH FAST CATH BRACH 5F 5CM (SHEATH) ×2
SHEATH PINNACLE 5F 10CM (SHEATH) ×2
SYR MEDRAD MARK V 150ML (SYRINGE) ×2
TRANSDUCER W/STOPCOCK (MISCELLANEOUS) ×4
TUBING CIL FLEX 10 FLL-RA (TUBING) ×2
WIRE EMERALD 3MM-J .035X260CM (WIRE) ×2
WIRE HI TORQ VERSACORE-J 145CM (WIRE) ×2

## 2016-08-03 NOTE — Interval H&P Note (Signed)
History and Physical Interval Note:  08/03/2016 11:06 AM  Catherine [name]  has presented today for surgery, with the diagnosis of sob    mitral stenosis  The various methods of treatment have been discussed with the patient and family. After consideration of risks, benefits and other options for treatment, the patient has consented to  Procedure(s): Right/Left Heart Cath and Coronary Angiography (N/A) as a surgical intervention .  The patient's history has been reviewed, patient examined, no change in status, stable for surgery.  I have reviewed the patient's chart and labs.  Questions were answered to the patient's satisfaction.     Collier Salina [facility] 08/03/2016 11:06 AM

## 2016-08-03 NOTE — Discharge Instructions (Signed)
Groin Surgical Site Care Refer to this sheet in the next few weeks. These instructions provide you with information about caring for yourself after your procedure. Your health care provider may also give you more specific instructions. Your treatment has been planned according to current medical practices, but problems sometimes occur. Call your health care provider if you have any problems or questions after your procedure. WHAT TO EXPECT AFTER THE PROCEDURE After your procedure, it is typical to have the following:  Bruising at the groin site that usually fades within 1-2 weeks.  Blood collecting in the tissue (hematoma) that may be painful to the touch. It should usually decrease in size and tenderness within 1-2 weeks. HOME CARE INSTRUCTIONS  Take medicines only as directed by your health care provider.  You may shower 24-48 hours after the procedure or as directed by your health care provider. Remove the bandage (dressing) and gently wash the site with plain soap and water. Pat the area dry with a clean towel. Do not rub the site, because this may cause bleeding.  Do not take baths, swim, or use a hot tub until your health care provider approves.  Check your insertion site every day for redness, swelling, or drainage.  Do not apply powder or lotion to the site.  Limit use of stairs to twice a day for the first 2-3 days or as directed by your health care provider.  Do not squat for the first 2-3 days or as directed by your health care provider.  Do not lift over 10 lb (4.5 kg) for 5 days after your procedure or as directed by your health care provider.  Ask your health care provider when it is okay to:  Return to work or school.  Resume usual physical activities or sports.  Resume sexual activity.  Do not drive home if you are discharged the same day as the procedure. Have someone else drive you.  You may drive 24 hours after the procedure unless otherwise instructed by your  health care provider.  Do not operate machinery or power tools for 24 hours after the procedure or as directed by your health care provider.  If your procedure was done as an outpatient procedure, which means that you went home the same day as your procedure, a responsible adult should be with you for the first 24 hours after you arrive home.  Keep all follow-up visits as directed by your health care provider. This is important. SEEK MEDICAL CARE IF:  You have a fever.  You have chills.  You have increased bleeding from the groin site. Hold pressure on the site. SEEK IMMEDIATE MEDICAL CARE IF:  You have unusual pain at the groin site.  You have redness, warmth, or swelling at the groin site.  You have drainage (other than a small amount of blood on the dressing) from the groin site.  The groin site is bleeding, and the bleeding does not stop after 30 minutes of holding steady pressure on the site.  Your leg or foot becomes pale, cool, tingly, or numb.   This information is not intended to replace advice given to you by your health care provider. Make sure you discuss any questions you have with your health care provider.   Document Released: 05/17/2014 Document Reviewed: 05/17/2014 Elsevier Interactive Patient Education 2016 [place] Refer to this sheet in the next few weeks. These instructions provide you with information about caring for yourself after  your procedure. Your health care provider may also give you more specific instructions. Your treatment has been planned according to current medical practices, but problems sometimes occur. Call your health care provider if you have any problems or questions after your procedure. WHAT TO EXPECT AFTER THE PROCEDURE After your procedure, it is typical to have the following:  Bruising at the radial site that usually fades within 1-2 weeks.  Blood collecting in the tissue (hematoma) that may be painful  to the touch. It should usually decrease in size and tenderness within 1-2 weeks. HOME CARE INSTRUCTIONS  Take medicines only as directed by your health care provider.  You may shower 24-48 hours after the procedure or as directed by your health care provider. Remove the bandage (dressing) and gently wash the site with plain soap and water. Pat the area dry with a clean towel. Do not rub the site, because this may cause bleeding.  Do not take baths, swim, or use a hot tub until your health care provider approves.  Check your insertion site every day for redness, swelling, or drainage.  Do not apply powder or lotion to the site.  Do not flex or bend the affected arm for 24 hours or as directed by your health care provider.  Do not push or pull heavy objects with the affected arm for 24 hours or as directed by your health care provider.  Do not lift over 10 lb (4.5 kg) for 5 days after your procedure or as directed by your health care provider.  Ask your health care provider when it is okay to:  Return to work or school.  Resume usual physical activities or sports.  Resume sexual activity.  Do not drive home if you are discharged the same day as the procedure. Have someone else drive you.  You may drive 24 hours after the procedure unless otherwise instructed by your health care provider.  Do not operate machinery or power tools for 24 hours after the procedure.  If your procedure was done as an outpatient procedure, which means that you went home the same day as your procedure, a responsible adult should be with you for the first 24 hours after you arrive home.  Keep all follow-up visits as directed by your health care provider. This is important. SEEK MEDICAL CARE IF:  You have a fever.  You have chills.  You have increased bleeding from the radial site. Hold pressure on the site. SEEK IMMEDIATE MEDICAL CARE IF:  You have unusual pain at the radial site.  You have  redness, warmth, or swelling at the radial site.  You have drainage (other than a small amount of blood on the dressing) from the radial site.  The radial site is bleeding, and the bleeding does not stop after 30 minutes of holding steady pressure on the site.  Your arm or hand becomes pale, cool, tingly, or numb.   This information is not intended to replace advice given to you by your health care provider. Make sure you discuss any questions you have with your health care provider.   Document Released: 10/16/2010 Document Revised: 10/04/2014 Document Reviewed: 04/01/2014 Elsevier Interactive Patient Education [employer].

## 2016-08-03 NOTE — H&P (Signed)
H&P   Date:  08/03/2016   ID:  Catherine [name], DOB [birth year], MRN [id number]  PCP:  [name], MD  Cardiologist:   Reino Bellis, NP      History of Present Illness: Catherine [name] is a 81 y.o. female with chronic atrial fibrillation, hypertension, moderate mitral regurgitation, rheumatic mitral valve disease, and COPD who presents for follow up. Catherine [name] was previously a patient of Catherine [name]. She underwent mitral commissurotomy in the 1970s. On her echocardiogram 02/2014 she had an ejection fraction of 60-65% with moderate to severe mitral regurgitation. There was also mild aortic regurgitation. Her follow-up echo 02/2015 showed an EF of 60-65% with moderate to severe mitral regurgitation, moderate tricuspid regurgitation and elevated pulmonary artery pressures.  At her last clinic appointment 05/21/16 she reported progressive fatigue and shortness of breath, so she was referred for an echo 9/11/7, that was revealed moderate pulmonary stenosis with moderate to severe mitral regurgitation.  She did not have any pulmonary hypertension and her LV was not dilated.    Since her last appointment Catherine [name] has been feeling up and down.  She has good days and bad days. Last week she had increased shortness of breath that did not respond to using her inhaler. Today he has been a better day. She also notes some swelling in her lower extremities, left greater than right, she denies orthopnea or PND. She continues to have episodes of palpitations intermittently.  She notes that her symptoms are somewhat exertional but also occurs at rest. In the past she liked to walk half a mile 5 days per week. Lately, she is only been able to do this one or 2 times per week and cannot always walked for the full half mile.  She is limited by shortness of breath but denies chest pain.  Subsequent stress Echo was performed and with exercise patient had evidence of severe MR and severe MS.   Past Medical  History:  Diagnosis Date  . Arthritis   . Asthma    last attack 02/2015  . Atrial fibrillation, chronic ([place])   . Chronic anticoagulation   . Chronic kidney disease    "RIGHT MANY KIDNEY INFECTIONS AND STONES"  . COPD (chronic obstructive pulmonary disease) ([place])   . H/O: rheumatic fever   . Heart murmur   . Hypertension   . PONV (postoperative nausea and vomiting)    ' SOMETIMES', BUT NOT ALWAYS"  . UTI (urinary tract infection)   . Valvular heart disease    Has mitral stenosis with prior mitral commissurotomy in 1970    Past Surgical History:  Procedure Laterality Date  . ABDOMINAL HYSTERECTOMY  1983   endometriosis  . BACK SURGERY     neurosurgery  . CARDIAC CATHETERIZATION    . cataract surg    . CHOLECYSTECTOMY N/A 05/30/2015   Procedure: LAPAROSCOPIC CHOLECYSTECTOMY WITH INTRAOPERATIVE CHOLANGIOGRAM;  Surgeon: Excell Seltzer, MD;  Location: [facility];  Service: General;  Laterality: N/A;  . commissurotomy  1970  . EYE SURGERY    . OTHER SURGICAL HISTORY     hysterectomy  . TEE WITHOUT CARDIOVERSION N/A 07/05/2016   Procedure: TRANSESOPHAGEAL ECHOCARDIOGRAM (TEE);  Surgeon: [name], MD;  Location: [facility] ENDOSCOPY;  Service: Cardiovascular;  Laterality: N/A;     Current Facility-Administered Medications  Medication Dose Route Frequency Provider Last Rate Last Dose  . 0.9 %  sodium chloride infusion  250 mL Intravenous PRN [name], MD      . [  START ON 08/04/2016] 0.9% sodium chloride infusion  3 mL/kg/hr Intravenous Continuous [name], MD       Followed by  . [START ON 08/04/2016] 0.9% sodium chloride infusion  1 mL/kg/hr Intravenous Continuous [name], MD      . aspirin 81 MG chewable tablet           . aspirin chewable tablet 81 mg  81 mg Oral Pre-Cath [name], MD      . sodium chloride flush (NS) 0.9 % injection 3 mL  3 mL Intravenous Q12H [name], MD      . sodium chloride flush (NS) 0.9 % injection 3 mL  3 mL  Intravenous PRN [name], MD        Allergies:   Aldactone [spironolactone]; Amoxicillin; Diltiazem; Flagyl [metronidazole hcl]; Flovent [fluticasone propionate]; Gabapentin; Lyrica [pregabalin]; Quinidine; Simvastatin; Tramadol; Verapamil; Ace inhibitors; Benazepril hcl; Ciprocin-fluocin-procin [fluocinolone acetonide]; Ciprofloxacin; Codeine; and Nitrofurantoin monohyd macro    Social History:  The patient  reports that she quit smoking about 40 years ago. Her smoking use included Cigarettes. She has a 15.00 pack-year smoking history. She has never used smokeless tobacco. She reports that she does not drink alcohol or use drugs.   Family History:  The patient's family history includes Leukemia in her father.    ROS:  Please see the history of present illness.   Otherwise, review of systems are positive for none.   All other systems are reviewed and negative.    PHYSICAL EXAM: VS:  BP (!) 148/59   Temp 98.6 F (37 C) (Oral)   Resp 20   Ht 5\' 4"  (1.626 m)   Wt 148 lb (67.1 kg)   SpO2 98%   BMI 25.40 kg/m  , BMI Body mass index is 25.4 kg/m. GENERAL:  Well appearing HEENT:  Pupils equal round and reactive, fundi not visualized, oral mucosa unremarkable NECK:  No jugular venous distention, waveform within normal limits, carotid upstroke brisk and symmetric, no bruits, no thyromegaly LYMPHATICS:  No cervical adenopathy LUNGS:  Clear to auscultation bilaterally HEART:  Irregularly irregular.  PMI not displaced or sustained,S1 and S2 within normal limits, no S3, no S4, no clicks, no rubs, II/VI systolic murmur at the RUSB and III/VI holosystolic murmur at the apex. ABD:  Flat, positive bowel sounds normal in frequency in pitch, no bruits, no rebound, no guarding, no midline pulsatile mass, no hepatomegaly, no splenomegaly EXT:  2 plus pulses throughout, no edema, no cyanosis no clubbing SKIN:  No rashes no nodules NEURO:  Cranial nerves II through XII grossly intact, motor  grossly intact throughout PSYCH:  Cognitively intact, oriented to person place and time   EKG:  EKG is not ordered today. The ekg ordered 05/21/16 demonstrates atrial fibrillateion.  Prior septal infarct.  Rate 60 bpm.  Unchanged from 10/22/15.  Echo 06/07/16:  Study Conclusions  - Left ventricle: The cavity size was normal. Wall thickness was   normal. Systolic function was vigorous. The estimated ejection   fraction was in the range of 65% to 70%. - Mitral valve: MV is thickened. with some restricted motion Peak   and mean gradients through the valve are 22 and 8 mm Hg   respectively MVA by Pt1/2 is 1.9 cm2. Calcified annulus. Mildly   thickened leaflets . There was moderate to severe regurgitation. - Left atrium: The atrium was massively dilated. - Right atrium: The atrium was mildly dilated.   Echo 03/12/15: Study Conclusions  - Left ventricle: The  cavity size was normal. Wall thickness was   increased in a pattern of moderate LVH. Systolic function was   normal. The estimated ejection fraction was in the range of 60%   to 65%. Wall motion was normal; there were no regional wall   motion abnormalities. - Mitral valve: Calcified annulus. Mildly thickened leaflets .   There was moderate to severe regurgitation. - Left atrium: Severely dilated at 75 ml/m2. - Right atrium: The atrium was mildly dilated. - Tricuspid valve: There was moderate regurgitation. - Pulmonary arteries: PA peak pressure: 41 mm Hg (S). - Inferior vena cava: The vessel was dilated. The respirophasic   diameter changes were blunted (< 50%), consistent with elevated   central venous pressure.  Impressions:  - Compared to the prior echo in 2015, there are few changes. The   left atrium now measures 75 ml/m2. There is persistent moderate   to severe mitral regurgitation.   Stress Echo: 07/27/16: Study Conclusions  - Mitral valve: Mitral stenosis at rest - mean gradient 82mmHg (mild   to moderate)    Mitral stenosis at stress (exericse) - mean gradient 72mmHg.   (Severe)     Mitral regugitation appears severe at both rest and stress. (PISA   radius 0.8cm at rest, 1.2cm at stress).   Severely dilated left atrium. - Stress ECG conclusions: Atrial fibrillation at baseline and   stress.   There was 2 mm hortizontal/ downsloping ST depression at stress   in several leads with elevation in aVR. - Staged echo: EF 65% at baseline and 75% with exercise. Study not   able to determine echocardiographic evidence of ischemia due to   lack of standard protocol.  Impressions:  - Abnormal ETT portion of study - poor exercise tolerance (81min),   ST depression 2 mm diffusely with aVR elevation concerning for   global ischemia. EF does improve during exercise. Study not   technically sufficient to assess wall motion abnormalities in all   segments.     Severe mitral regurgitation with severe mitral stenosis at   stress. See above for details.  Recent Labs: 09/08/2015: B Natriuretic Peptide 130.5 05/21/2016: ALT 21 07/28/2016: BUN 13; Creat 0.79; Hemoglobin 13.9; Platelets 224; Potassium 3.3; Sodium 144    Lipid Panel    Component Value Date/Time   CHOL 217 (H) 07/12/2016 1017   TRIG 130.0 07/12/2016 1017   HDL 59.10 07/12/2016 1017   CHOLHDL 4 07/12/2016 1017   VLDL 26.0 07/12/2016 1017   LDLCALC 132 (H) 07/12/2016 1017   LDLDIRECT 134.7 08/28/2014 1009      Wt Readings from Last 3 Encounters:  08/03/16 148 lb (67.1 kg)  07/29/16 151 lb 3.2 oz (68.6 kg)  07/12/16 144 lb 6.4 oz (65.5 kg)      ASSESSMENT AND PLAN:  # Moderate to severe MR:  # Severe mitral regurgitation:  # Rheumatic mitral valve disease: # Shortness of breath: Ms. Lapan has moderate mitral stenosis and moderate to severe mitral regurgitation. She is symptomatic. Stress Echo consistent with severe MS and MR with stress. Will proceed with Right and left heart cath today.  # Persistent Atrial fibrillation:  Rates remain well-controlled. However she reports increased palpitations at home. Given that her heart rate is low today we will not titrate her beta blocker at this time. Continue atenolol and warfarin.   # Hypertension:  Blood pressure is well-controlled on atenolol and triamterene-HCTZ.  # CV Disease Prevention: Lipids are elevated but she has not tolerated statins  in the past.  Continues Zetia.  If she is found to have coronary artery disease on either stress or cath we will work aggressively with her cholesterol management.    Signed, Evanie Buckle [place] MD, [facility]   08/03/2016 10:10 AM    [place]

## 2016-08-03 NOTE — Progress Notes (Signed)
55fr sheath aspirated and removed from RFA, manual pressure applied for 20 minutes. Groin level 0, Bilateral DP and PT  pulses present with doppler.Bedrest instructions given, tegaderm dressing applied  Bedrest begins at  13:20:00

## 2016-08-04 ENCOUNTER — Telehealth: Payer: Self-pay | Admitting: Cardiovascular Disease

## 2016-08-04 ENCOUNTER — Encounter (HOSPITAL_COMMUNITY): Payer: Self-pay | Admitting: Cardiology

## 2016-08-04 ENCOUNTER — Institutional Professional Consult (permissible substitution) (INDEPENDENT_AMBULATORY_CARE_PROVIDER_SITE_OTHER): Payer: Medicare Other | Admitting: Thoracic Surgery (Cardiothoracic Vascular Surgery)

## 2016-08-04 VITALS — BP 122/82 | HR 61 | Resp 20 | Ht 64.0 in | Wt 148.0 lb

## 2016-08-04 DIAGNOSIS — I5032 Chronic diastolic (congestive) heart failure: Secondary | ICD-10-CM

## 2016-08-04 DIAGNOSIS — I099 Rheumatic heart disease, unspecified: Secondary | ICD-10-CM | POA: Diagnosis not present

## 2016-08-04 DIAGNOSIS — I38 Endocarditis, valve unspecified: Secondary | ICD-10-CM

## 2016-08-04 DIAGNOSIS — I340 Nonrheumatic mitral (valve) insufficiency: Secondary | ICD-10-CM

## 2016-08-04 DIAGNOSIS — I050 Rheumatic mitral stenosis: Secondary | ICD-10-CM

## 2016-08-04 DIAGNOSIS — I481 Persistent atrial fibrillation: Secondary | ICD-10-CM

## 2016-08-04 DIAGNOSIS — I2510 Atherosclerotic heart disease of native coronary artery without angina pectoris: Secondary | ICD-10-CM

## 2016-08-04 DIAGNOSIS — I4819 Other persistent atrial fibrillation: Secondary | ICD-10-CM

## 2016-08-04 DIAGNOSIS — E876 Hypokalemia: Secondary | ICD-10-CM

## 2016-08-04 DIAGNOSIS — I5033 Acute on chronic diastolic (congestive) heart failure: Secondary | ICD-10-CM | POA: Insufficient documentation

## 2016-08-04 NOTE — Telephone Encounter (Signed)
Mrs.Shur is returning your call , please call on cell # [phone]

## 2016-08-04 NOTE — Telephone Encounter (Signed)
-----   Message from [name], MD sent at 08/02/2016  6:26 PM EST ----- Potassium levels remain low.   Recommend taking Kdur 62mEq daily.

## 2016-08-04 NOTE — Telephone Encounter (Signed)
Notes Recorded by Earvin Hansen on 08/04/2016 at 5:12 PM EST Patient already taking K+ 80 meq. Discussed with Claiborne Billings D and will increase to 100 meq daily. She also recommended a magnesium level Confirmed dose with patient and advised of increase She will get at PCP next week

## 2016-08-04 NOTE — Progress Notes (Signed)
[address]       [place] [address]             [phone]     CARDIOTHORACIC SURGERY CONSULTATION REPORT  Referring Provider is [name], MD PCP is [name], MD  Chief Complaint  Patient presents with  . Mitral Regurgitation    Surgical eval, Cardaic Cath 08/03/16, Spirometry 07/29/16, Stress test 07/27/16, TEE 07/05/16, ECHO 06/07/16  . Mitral Stenosis  . Coronary Artery Disease    HPI:  Patient is a 81 year old female with history of rheumatic fever during childhood and long-standing history of rheumatic heart disease, status post open mitral commissurotomy via left thoracotomy approach in 1970, long-standing persistent atrial fibrillation, chronic diastolic congestive heart failure, hypertension, and COPD who has been referred for surgical consultation to discuss treatment options for management of severe mitral regurgitation and mitral stenosis. The patient doesn't recall being hospitalized for rheumatic fever during childhood but she was diagnosed with it and the noted to have a heart murmur during her teenage years. She developed severe congestive heart failure and underwent open mitral commissurotomy in 1970. Her recovery was slow and prolonged but she ultimately did remarkably well. Approximately 10 years later she developed atrial fibrillation. She underwent cardioversion at least 2 or 3 times but has remained in long-standing persistent atrial fibrillation for many years. She has been chronically anticoagulated using warfarin for at least 30 years. For a long time she was followed by [name], but since his retirement she has been followed by Dr. Oval Linsey. Over the past several years the patient has developed worsening symptoms of exertional shortness of breath and fatigue.  She has a remote history of tobacco use although she quit smoking in 1977. She has been followed by Dr. [name] for COPD.  She was seen in follow-up recently by Dr.  Oval Linsey and follow-up echocardiogram revealed normal left ventricular systolic function with ejection fraction estimated 65-70%. Peak and mean transvalvular gradients across the mitral valve were estimated 22 and 8 mmHg respectively, corresponding to valve area estimated 1.9 cm. However, there was moderate to severe mitral regurgitation.  She subsequently underwent TEE to further evaluate the severity of mitral regurgitation on 07/05/2016. This confirmed the presence of normal left ventricular systolic function. Mean transvalvular gradient across the mitral valve was estimated 5 mmHg. There was moderate to severe mitral regurgitation with ERO measured 0.25 cm corresponding to regurgitant volume estimated 48 mL using PISA.  There was severe left and right atrial enlargement with no evidence of thrombus in the left atrial appendage.  Due to equivocal findings, stress echocardiography was performed 07/27/2016. With stress the mean gradient across the mitral valve increased from 6-14 mmHg, and mitral regurgitation appeared severe. In addition, the patient had some mild ST segment changes on EKG during peak stress.  The patient subsequently underwent left and right heart catheterization 08/03/2016.  At catheterization the patient had normal left ventricular systolic function with severe mitral valve stenosis, moderate multivessel coronary artery disease with tandem 80% stenosis in the right coronary artery, 50% stenosis in the left circumflex coronary artery, and only mild pulmonary hypertension.  In addition, the right brachial artery appeared occluded. The patient was referred for surgical consultation.  The patient is married and lives with her husband in [place].  She still works part-time as a Theme park manager. She has remained remarkably active throughout her adult life although she complains that she has had to slow down dramatically for the past  3 years or more because of worsening exertional shortness of breath  and fatigue. She states that she has had some degree of exertional shortness of breath for many many years.  Symptoms have gradually progressed over the past several years and she now gets short of breath with very mild activity and occasionally at rest. Her symptoms significantly limited her daily physical activities. She cannot lay flat in bed when she sleeps. She sometimes wakes up in the morning feeling her heart racing and short of breath. She has chronic swelling of both lower legs. She denies any history of exertional chest pain or chest tightness. She also complains of chronic swelling in pressure-like sensation in the right upper quadrant.  Past Medical History:  Diagnosis Date  . Arthritis   . Asthma    last attack 02/2015  . Atrial fibrillation, chronic ([place])   . Chronic anticoagulation   . Chronic diastolic CHF (congestive heart failure) ([place])   . Chronic kidney disease    "RIGHT MANY KIDNEY INFECTIONS AND STONES"  . COPD (chronic obstructive pulmonary disease) ([place])   . Coronary artery disease   . H/O: rheumatic fever   . Heart murmur   . Hypertension   . PONV (postoperative nausea and vomiting)    ' SOMETIMES', BUT NOT ALWAYS"  . UTI (urinary tract infection)   . Valvular heart disease    Has mitral stenosis with prior mitral commissurotomy in 1970    Past Surgical History:  Procedure Laterality Date  . ABDOMINAL HYSTERECTOMY  1983   endometriosis  . BACK SURGERY     neurosurgery  . CARDIAC CATHETERIZATION    . CARDIAC CATHETERIZATION N/A 08/03/2016   Procedure: Right/Left Heart Cath and Coronary Angiography;  Surgeon: Peter M Martinique, MD;  Location: [facility];  Service: Cardiovascular;  Laterality: N/A;  . cataract surg    . CHOLECYSTECTOMY N/A 05/30/2015   Procedure: LAPAROSCOPIC CHOLECYSTECTOMY WITH INTRAOPERATIVE CHOLANGIOGRAM;  Surgeon: Excell Seltzer, MD;  Location: [facility];  Service: General;  Laterality: N/A;  . EYE SURGERY    . MITRAL VALVE SURGERY  Left 1970   Open mitral commissurotomy via left thoracotomy approach  . TEE WITHOUT CARDIOVERSION N/A 07/05/2016   Procedure: TRANSESOPHAGEAL ECHOCARDIOGRAM (TEE);  Surgeon: [name], MD;  Location: [facility];  Service: Cardiovascular;  Laterality: N/A;    Family History  Problem Relation Age of Onset  . Leukemia Father     Social History   Social History  . Marital status: Married    Spouse name: N/A  . Number of children: N/A  . Years of education: N/A   Occupational History  . Not on file.   Social History Main Topics  . Smoking status: Former Smoker    Packs/day: 1.00    Years: 15.00    Types: Cigarettes    Quit date: 09/28/1975  . Smokeless tobacco: Never Used  . Alcohol use No  . Drug use: No  . Sexual activity: Yes   Other Topics Concern  . Not on file   Social History Narrative  . No narrative on file    Current Outpatient Prescriptions  Medication Sig Dispense Refill  . acetaminophen (TYLENOL) 500 MG tablet Take 500 mg by mouth every 6 (six) hours as needed for mild pain.    Marland Kitchen atenolol (TENORMIN) 25 MG tablet Take 0.5 tablets (12.5 mg total) by mouth daily. 45 tablet 2  . B Complex-C (B-COMPLEX WITH VITAMIN C) tablet Take 1 tablet by mouth daily.    Marland Kitchen  budesonide-formoterol (SYMBICORT) 80-4.5 MCG/ACT inhaler Inhale 2 puffs into the lungs 2 (two) times daily. 1 Inhaler 11  . diazepam (VALIUM) 5 MG tablet Take 1 tablet (5 mg total) by mouth at bedtime. 30 tablet 5  . furosemide (LASIX) 80 MG tablet Take 40 mg by mouth 2 (two) times daily.    . Multiple Vitamin (MULTIVITAMIN) tablet Take 1 tablet by mouth daily.      Marland Kitchen PROAIR HFA 108 (90 Base) MCG/ACT inhaler 2 puffs every 6 hours as needed for wheezi ng or shortness of breath. 8.5 g 2  . triamterene-hydrochlorothiazide (MAXZIDE-25) 37.5-25 MG tablet Take 1 tablet by mouth daily.    Marland Kitchen warfarin (COUMADIN) 2 MG tablet Take 2 mg by mouth one time only at 6 PM. 2.5mg  on MON/FRI's and 2 mg all other days      . enoxaparin (LOVENOX) 100 MG/ML injection Inject 1 mL (100 mg total) into the skin daily. On 11/4, 11/5, and 11/8 (Patient not taking: Reported on 08/04/2016) 5 Syringe 0  . ezetimibe (ZETIA) 10 MG tablet Take 1 tablet (10 mg total) by mouth daily. 30 tablet 5  . potassium chloride (K-DUR) 10 MEQ tablet Take 8 tablets (80 mEq total) by mouth once. 720 tablet 1   No current facility-administered medications for this visit.     Allergies  Allergen Reactions  . Aldactone [Spironolactone] Other (See Comments)    dyspnea  . Amoxicillin Palpitations    Tachycardia Has patient had a PCN reaction causing immediate rash, facial/tongue/throat swelling, SOB or lightheadedness with hypotension: no Has patient had a PCN reaction causing severe rash involving mucus membranes or skin necrosis: {no Has patient had a PCN reaction that required hospitalization {no Has patient had a PCN reaction occurring within the last 10 years: {yes If all of the above answers are "NO", then may proceed with Cephalosporin use.  . Diltiazem Other (See Comments)    Causing headaches   . Flagyl [Metronidazole Hcl] Other (See Comments)    Causing headaches    . Flovent [Fluticasone Propionate] Other (See Comments)    Leg cramps  . Gabapentin Swelling  . Lyrica [Pregabalin] Swelling  . Quinidine Diarrhea and Other (See Comments)    Fever diarrhea  . Simvastatin Other (See Comments)    Leg pain, myalgia  . Tramadol Nausea Only  . Verapamil Other (See Comments)    myalgias  . Ace Inhibitors Other (See Comments)    unknown  . Benazepril Hcl Cough  . Ciprocin-Fluocin-Procin [Fluocinolone Acetonide] Other (See Comments)    unknown  . Ciprofloxacin Diarrhea  . Codeine Nausea Only  . Nitrofurantoin Monohyd Macro Nausea Only      Review of Systems:   General:  normal appetite, decreased energy, + weight gain, no weight loss, no fever  Cardiac:  no chest pain with exertion, no chest pain at rest, + SOB with  exertion, occasional resting SOB, no PND, + orthopnea, + palpitations, + arrhythmia, + atrial fibrillation, + LE edema, no dizzy spells, no syncope  Respiratory:  + shortness of breath, no home oxygen, no productive cough, no dry cough, no bronchitis, no wheezing, no hemoptysis, no asthma, no pain with inspiration or cough, no sleep apnea, no CPAP at night  GI:   no difficulty swallowing, no reflux, no frequent heartburn, no hiatal hernia, no abdominal pain, no constipation, no diarrhea, no hematochezia, no hematemesis, no melena  GU:   no dysuria,  no frequency, no urinary tract infection, no hematuria, no kidney stones, +  mild chronic kidney disease  Vascular:  no pain suggestive of claudication, no pain in feet, occasional leg cramps, + varicose veins, no DVT, no non-healing foot ulcer  Neuro:   no stroke, no TIA's, no seizures, no headaches, no temporary blindness one eye,  no slurred speech, no peripheral neuropathy, no chronic pain, no instability of gait, no memory/cognitive dysfunction  Musculoskeletal: mild arthritis, no joint swelling, no myalgias, no difficulty walking, normal mobility   Skin:   no rash, no itching, no skin infections, no pressure sores or ulcerations  Psych:   + anxiety, no depression, no nervousness, no unusual recent stress  Eyes:   + blurry vision, no floaters, + recent vision changes, + wears glasses for reading  ENT:   no hearing loss, no loose or painful teeth, no dentures, last saw dentist 07/2015  Hematologic:  + easy bruising, + abnormal bleeding, no clotting disorder, no frequent epistaxis  Endocrine:  no diabetes, does not check CBG's at home     Physical Exam:   BP 122/82 (BP Location: Left Arm, Patient Position: Sitting, Cuff Size: Small)   Pulse 61   Resp 20   Ht 5\' 4"  (1.626 m)   Wt 148 lb (67.1 kg)   SpO2 99% Comment: RA  BMI 25.40 kg/m   General:  Elderly but well-appearing  HEENT:  Unremarkable   Neck:   no JVD, no bruits, no adenopathy    Chest:   clear to auscultation, symmetrical breath sounds, no wheezes, no rhonchi   CV:   Irregular rate and rhythm, + systolic murmur   Abdomen:  soft, non-tender, no masses   Extremities:  warm, well-perfused, pulses diminished, + bilateral LE edema  Rectal/GU  Deferred  Neuro:   Grossly non-focal and symmetrical throughout  Skin:   Clean and dry, no rashes, no breakdown   Diagnostic Tests:  Transthoracic Echocardiography  Patient:    Torrey, Pons MR #:       [id number] Study Date: 06/07/2016 Gender:     F Age:        62 Height:     162.6 cm Weight:     67.7 kg BSA:        1.76 m^2 Pt. Status: Room:   ATTENDING    Dorris Carnes, M.D.  SONOGRAPHER  Oletta Lamas, Will  PERFORMING   [facility]  ORDERING     [name], MD  Rafael Hernandez, MD  cc:  ------------------------------------------------------------------- LV EF: 65% -   70%  ------------------------------------------------------------------- Indications:      (I34.0).  ------------------------------------------------------------------- History:   PMH:  Rheumatic mitral disease. H/o Mitral commissurotomy 1970s. Moderate MR. Acquired from the patient and from the patient&'s chart.  Dyspnea.  Atrial fibrillation.  Chronic obstructive pulmonary disease.  Risk factors:  Hypertension.  ------------------------------------------------------------------- Study Conclusions  - Left ventricle: The cavity size was normal. Wall thickness was   normal. Systolic function was vigorous. The estimated ejection   fraction was in the range of 65% to 70%. - Mitral valve: MV is thickened. with some restricted motion Peak   and mean gradients through the valve are 22 and 8 mm Hg   respectively MVA by Pt1/2 is 1.9 cm2. Calcified annulus. Mildly   thickened leaflets . There was moderate to severe regurgitation. - Left atrium: The atrium was massively dilated. - Right atrium: The atrium was mildly  dilated.  ------------------------------------------------------------------- Study data:  Comparison was made to the study of 03/12/2015.  Study status:  Routine.  Procedure:  The patient reported no pain pre or post test. Transthoracic echocardiography for left ventricular function evaluation and for assessment of valvular function. Image quality was adequate.  Study completion:  There were no complications.          Transthoracic echocardiography.  M-mode, complete 2D, spectral Doppler, and color Doppler.  Birthdate: Patient birthdate: Jun 07, 1935.  Age:  Patient is 81 yr old.  Sex: Gender: female.    BMI: 25.6 kg/m^2.  Blood pressure:     134/73 Patient status:  Outpatient.  Study date:  Study date: 06/07/2016. Study time: 01:15 PM.  Location:  [facility]  -------------------------------------------------------------------  ------------------------------------------------------------------- Left ventricle:  The cavity size was normal. Wall thickness was normal. Systolic function was vigorous. The estimated ejection fraction was in the range of 65% to 70%.  ------------------------------------------------------------------- Aortic valve:  AV is thickened with minimally restricted motion. Doppler:  There was no regurgitation.  ------------------------------------------------------------------- Mitral valve:  MV is thickened. with some restricted motion Peak and mean gradients through the valve are 22 and 8 mm Hg respectively MVA by Pt1/2 is 1.9 cm2.  Calcified annulus. Mildly thickened leaflets .  Doppler:  There was moderate to severe regurgitation.    Valve area by pressure half-time: 1.85 cm^2. Indexed valve area by pressure half-time: 1.05 cm^2/m^2. Valve area by continuity equation (using LVOT flow): 0.65 cm^2. Indexed valve area by continuity equation (using LVOT flow): 0.37 cm^2/m^2. Mean gradient (D): 7 mm Hg. Peak gradient (D): 15 mm  Hg.  ------------------------------------------------------------------- Left atrium:  The atrium was massively dilated.  ------------------------------------------------------------------- Right ventricle:  The cavity size was normal. Wall thickness was normal. Systolic function was normal.  ------------------------------------------------------------------- Pulmonic valve:    Structurally normal valve.   Cusp separation was normal.  Doppler:  Transvalvular velocity was within the normal range. There was trivial regurgitation.  ------------------------------------------------------------------- Tricuspid valve:   Structurally normal valve.   Leaflet separation was normal.  Doppler:  Transvalvular velocity was within the normal range. There was mild regurgitation.  ------------------------------------------------------------------- Right atrium:  The atrium was mildly dilated.  ------------------------------------------------------------------- Pericardium:  There was no pericardial effusion.  ------------------------------------------------------------------- Systemic veins: Inferior vena cava: The vessel was dilated. The respirophasic diameter changes were in the normal range (>= 50%).  ------------------------------------------------------------------- Measurements   Left ventricle                            Value          Reference  LV ID, ED, PLAX chordal           (L)     41.7  mm       43 - 52  LV ID, ES, PLAX chordal                   24.8  mm       23 - 38  LV fx shortening, PLAX chordal            41    %        >=29  LV PW thickness, ED                       10.5  mm       ---------  IVS/LV PW ratio, ED                       1.09           <=  1.3  Stroke volume, 2D                         41    ml       ---------  Stroke volume/bsa, 2D                     23    ml/m^2   ---------    Ventricular septum                        Value          Reference  IVS  thickness, ED                         11.4  mm       ---------    LVOT                                      Value          Reference  LVOT ID, S                                15    mm       ---------  LVOT area                                 1.77  cm^2     ---------  LVOT ID                                   15    mm       ---------  LVOT peak velocity, S                     125   cm/s     ---------  LVOT mean velocity, S                     68.4  cm/s     ---------  LVOT VTI, S                               23    cm       ---------  LVOT peak gradient, S                     6     mm Hg    ---------  Stroke volume (SV), LVOT DP               40.6  ml       ---------  Stroke index (SV/bsa), LVOT DP            23.1  ml/m^2   ---------    Aorta                                     Value          Reference  Aortic root ID, ED  33    mm       ---------  Ascending aorta ID, A-P, S                35    mm       ---------    Left atrium                               Value          Reference  LA ID, A-P, ES                            59    mm       ---------  LA ID/bsa, A-P                    (H)     3.35  cm/m^2   <=2.2  LA volume, S                              135   ml       ---------  LA volume/bsa, S                          76.6  ml/m^2   ---------  LA volume, ES, 1-p A4C                    129   ml       ---------  LA volume/bsa, ES, 1-p A4C                73.2  ml/m^2   ---------  LA volume, ES, 1-p A2C                    132   ml       ---------  LA volume/bsa, ES, 1-p A2C                74.9  ml/m^2   ---------    Mitral valve                              Value          Reference  Mitral E-wave peak velocity               193   cm/s     ---------  Mitral mean velocity, D                   111   cm/s     ---------  Mitral deceleration time          (H)     391   ms       150 - 230  Mitral pressure half-time                 120   ms       ---------  Mitral mean  gradient, D                   7     mm Hg    ---------  Mitral peak gradient, D                   15  mm Hg    ---------  Mitral valve area, PHT, DP                1.85  cm^2     ---------  Mitral valve area/bsa, PHT, DP            1.05  cm^2/m^2 ---------  Mitral valve area, LVOT                   0.65  cm^2     ---------  continuity  Mitral valve area/bsa, LVOT               0.37  cm^2/m^2 ---------  continuity  Mitral annulus VTI, D                     62.7  cm       ---------    Pulmonary arteries                        Value          Reference  PA pressure, S, DP                        30    mm Hg    <=30    Tricuspid valve                           Value          Reference  Tricuspid regurg peak velocity            259   cm/s     ---------  Tricuspid peak RV-RA gradient             27    mm Hg    ---------    Systemic veins                            Value          Reference  Estimated CVP                             3     mm Hg    ---------    Right ventricle                           Value          Reference  RV pressure, S, DP                        30    mm Hg    <=30  Legend: (L)  and  (H)  mark values outside specified reference range.  ------------------------------------------------------------------- Prepared and Electronically Authenticated by  Dorris Carnes, M.D. 2017-09-11T18:34:39     Transesophageal Echocardiography  Patient:    Daija, Camm MR #:       [id number] Study Date: 07/05/2016 Gender:     F Age:        38 Height:     162.6 cm Weight:     67.6 kg BSA:        1.76 m^2 Pt. Status: Room:   SONOGRAPHER  Darlina Sicilian, Bethania    [name], MD  ATTENDING    Jonelle Sidle  Oval Linsey, MD  ORDERING     [name], MD  PERFORMING   [name], MD  [place],  MD  cc:  -------------------------------------------------------------------  ------------------------------------------------------------------- Indications:      MVD [non-rheumatic] 424.0.  ------------------------------------------------------------------- History:   PMH:  Shortness of Breath and Fatigue. Chronic Kidney Disease.  Murmur.  Atrial fibrillation.  ------------------------------------------------------------------- Study Conclusions  - Left ventricle: Systolic function was normal. Wall motion was   normal; there were no regional wall motion abnormalities. - Mitral valve: Thickening. Moderate of the anterior leaflet and   posterior leaflet, consistent with rheumatic disease. The   findings are consistent with moderate stenosis. There was   moderate regurgitation directed centrally. Mean gradient (D): 5   mm Hg. Effective regurgitant orifice (PISA): 0.25 cm^2.   Regurgitant volume (PISA): 48 ml. - Left atrium: The atrium was severely dilated. No evidence of   thrombus in the atrial cavity or appendage. No evidence of   thrombus in the atrial cavity or appendage. No evidence of   thrombus in the atrial cavity or appendage. - Right atrium: The atrium was severely dilated. No evidence of   thrombus in the atrial cavity or appendage. - Atrial septum: No defect or patent foramen ovale was identified   by color flow Doppler.  ------------------------------------------------------------------- Labs, prior tests, procedures, and surgery: Commissurotomy 1970.  ------------------------------------------------------------------- Study data:   Study status:  Routine.  Consent:  The risks, benefits, and alternatives to the procedure were explained to the patient and informed consent was obtained.  Procedure:  The patient reported no pain pre or post test. Initial setup. The patient was brought to the laboratory. Surface ECG leads were monitored. Sedation. Conscious  sedation was administered by cardiology staff. Transesophageal echocardiography. A transesophageal probe was inserted by the attending cardiologistwithout difficulty. Image quality was adequate.  Study completion:  The patient tolerated the procedure well. There were no complications.  Administered medications:   Fentanyl, 27mcg.  Midazolam, 6mg .  Diphenhydramine, 25mg .          Diagnostic transesophageal echocardiography.  2D and color Doppler.  Birthdate:  Patient birthdate: Jun 28, 1935.  Age: Patient is 81 yr old.  Sex:  Gender: female.    BMI: 25.6 kg/m^2. Blood pressure:     152/82  Patient status:  Inpatient.  Study date:  Study date: 07/05/2016. Study time: 03:45 PM.  Location: [facility].  -------------------------------------------------------------------  ------------------------------------------------------------------- Left ventricle:  Systolic function was normal. Wall motion was normal; there were no regional wall motion abnormalities.  ------------------------------------------------------------------- Aortic valve:   Structurally normal valve. Trileaflet; normal thickness leaflets. Cusp separation was normal.  Doppler:  There was no significant regurgitation. Evidence of Lambl&'s excrescences.   ------------------------------------------------------------------- Aorta:  There was mild atheromatous plaque. There was no evidence for dissection. Aortic root: The aortic root was not dilated. Ascending aorta: The ascending aorta was normal in size. Aortic arch: The aortic arch was normal in size. Descending aorta: The descending aorta was normal in size.  ------------------------------------------------------------------- Mitral valve:   Thickening.  Moderate of the anterior leaflet and posterior leaflet, consistent with rheumatic disease. Leaflet separation was normal.  Doppler:   The findings are consistent with moderate stenosis.   There was moderate regurgitation  directed centrally.    Mean gradient (D): 5 mm Hg.  ------------------------------------------------------------------- Left atrium:  The atrium was severely dilated.  No evidence of thrombus in the atrial cavity or appendage.  No evidence of thrombus in the atrial cavity or appendage.  No evidence  of thrombus in the atrial cavity or appendage. The appendage was morphologically a left appendage, multilobulated, and of normal size. Emptying velocity was normal.  ------------------------------------------------------------------- Atrial septum:  No defect or patent foramen ovale was identified by color flow Doppler.  ------------------------------------------------------------------- Pulmonary veins:  Well visualized. No systolic flow reversal.  ------------------------------------------------------------------- Right ventricle:  The cavity size was normal. Wall thickness was normal. Systolic function was normal.  ------------------------------------------------------------------- Pulmonic valve:    Structurally normal valve.  ------------------------------------------------------------------- Tricuspid valve:   Structurally normal valve.   Leaflet separation was normal.  Doppler:  There was trivial regurgitation.  ------------------------------------------------------------------- Pulmonary artery:   The main pulmonary artery was normal-sized.  ------------------------------------------------------------------- Right atrium:  The atrium was severely dilated.  No evidence of thrombus in the atrial cavity or appendage. The appendage was morphologically a right appendage.  ------------------------------------------------------------------- Pericardium:  There was no pericardial effusion.  ------------------------------------------------------------------- Measurements   Mitral valve                                  Value  Mitral mean velocity, D                        94.8   cm/s  Mitral mean gradient, D                       5      mm Hg  Mitral annulus VTI, D                         51.5   cm  Mitral regurg VTI, PISA                       192    cm  Mitral ERO, PISA                              0.25   cm^2  Mitral regurg volume, PISA                    48     ml    Tricuspid valve                               Value  Tricuspid regurg peak velocity                216.62 cm/s  Tricuspid peak RV-RA gradient                 19     mm Hg  Tricuspid maximal regurg velocity, PISA       216.62 cm/s  Legend: (L)  and  (H)  mark values outside specified reference range.  ------------------------------------------------------------------- Prepared and Electronically Authenticated by  [name], MD 2017-10-09T18:47:01    Stress Echocardiography  Patient:    Lodema, Bender MR #:       [id number] Study Date: 07/27/2016 Gender:     F Age:        78 Height:     162.6 cm Weight:     65.5 kg BSA:        1.73 m^2 Pt. Status: Room:   SONOGRAPHER  Victorio Palm, [place], Outpatient  ATTENDING  [name], MD  ORDERING     [name], MD  Dewy Rose, MD  cc:  -------------------------------------------------------------------  ------------------------------------------------------------------- Indications:      (R06.02).  (I34.0). Assess Mitral valve (stenosis and regurgitation)  ------------------------------------------------------------------- History:   PMH:  Acquired from the patient and from the patient&'s chart.  Dyspnea and murmur.  Persistent atrial fibrillation. Moderate mitral regurgitation.  Borderline significant mitral stenosis.  Chronic obstructive pulmonary disease.  Rheumatic fever.  Risk factors:  Hypertension. Dyslipidemia.  ------------------------------------------------------------------- Study Conclusions  - Mitral valve: Mitral stenosis at rest -  mean gradient 34mmHg (mild   to moderate)   Mitral stenosis at stress (exericse) - mean gradient 42mmHg.   (Severe)     Mitral regugitation appears severe at both rest and stress. (PISA   radius 0.8cm at rest, 1.2cm at stress).   Severely dilated left atrium. - Stress ECG conclusions: Atrial fibrillation at baseline and   stress.   There was 2 mm hortizontal/ downsloping ST depression at stress   in several leads with elevation in aVR. - Staged echo: EF 65% at baseline and 75% with exercise. Study not   able to determine echocardiographic evidence of ischemia due to   lack of standard protocol.  Impressions:  - Abnormal ETT portion of study - poor exercise tolerance (33min),   ST depression 2 mm diffusely with aVR elevation concerning for   global ischemia. EF does improve during exercise. Study not   technically sufficient to assess wall motion abnormalities in all   segments.     Severe mitral regurgitation with severe mitral stenosis at   stress. See above for details.  ------------------------------------------------------------------- Labs, prior tests, procedures, and surgery: Transesophageal echocardiography (07/05/2016).    The mitral valve showed moderate stenosis and moderate regurgitation. Mitral valve: mean gradient of 5 mm Hg.  Catheterization with cardiac intervention (1970).    Mitral commissurotomy was performed.  ------------------------------------------------------------------- Study data:  Images are not labeled pre and post, sorry. Images 1-29 are pre exercise pictures. Images 30 to the end are post exercise pictures.  Study status:  Routine.  Consent:  The risks, benefits, and alternatives to the procedure were explained to the patient and informed consent was obtained.  Procedure:  The patient reported no pain pre or post test. Initial setup. The patient was brought to the laboratory. A baseline ECG was recorded. Surface ECG leads and automatic  cuff blood pressure measurements were monitored. Treadmill exercise testing was performed using the Bruce protocol. The patient exercised for 3 min, to protocol stage 1, to a maximal work rate of 4.6 mets. Exercise was terminated due to fatigue. The patient was positioned for image acquisition and recovery monitoring. Transthoracic stress echocardiography for assessment of valvular function and Unable to do ischemia assess due to atrial fibrillation. Image quality was adequate. Images were captured at baseline and peak exercise.  Study completion:  The patient tolerated the procedure well. There were no complications.         Bruce protocol. Stress echocardiography.  Birthdate: Patient birthdate: 1935-07-10.  Age:  Patient is 81 yr old.  Sex: Gender: female.    BMI: 24.8 kg/m^2.  Blood pressure:     142/77 Patient status:  Outpatient.  Study date:  Study date: 07/27/2016. Study time: 07:38 AM.  -------------------------------------------------------------------  ------------------------------------------------------------------- Mitral valve:  Mitral stenosis at rest - mean gradient 68mmHg (mild to moderate) Mitral stenosis at stress (exericse) - mean gradient 25mmHg. (Severe)  Mitral regugitation appears severe at  both rest and stress. (PISA radius 0.8cm at rest, 1.2cm at stress). Severely dilated left atrium. Pre exercise mitral valve measurements Mitral mean velocity, D Mitral mean gradient, D 6 mm Hg Mitral annulus VTI, D 49 cm Mitral regurg, VTI, PISA 195 cm Mitral ERO, PISA 0.28 cm2 Mitral regurg volume, PISA 55 ml  Doppler:     Mean gradient (D): 14 mm Hg.  ------------------------------------------------------------------- Stress protocol:  +---------------------+---+-----------+---------------+ !Stage                !HR !BP (mmHg)  !Symptoms       ! +---------------------+---+-----------+---------------+ !Baseline             !59 !142/77 (99)!None            ! +---------------------+---+-----------+---------------+ !Stage 1              !117!52/33 (39) !Fatigue, RPE 17! +---------------------+---+-----------+---------------+ !Immediate post stress!108!-----------!None           ! +---------------------+---+-----------+---------------+ !Recovery; 1 min      !96 !92/46 (61) !None           ! +---------------------+---+-----------+---------------+ !Recovery; 2 min      !71 !-----------!None           ! +---------------------+---+-----------+---------------+ !Recovery; 3 min      !68 !-----------!None           ! +---------------------+---+-----------+---------------+ !Recovery; 4 min      !69 !126/73 (91)!None           ! +---------------------+---+-----------+---------------+ !Recovery; 5 min      !64 !-----------!None           ! +---------------------+---+-----------+---------------+ !Late recovery        !65 !117/69 (85)!None           ! +---------------------+---+-----------+---------------+  ------------------------------------------------------------------- Stress results:   Maximal heart rate during stress was 117 bpm (83% of maximal predicted heart rate). The maximal predicted heart rate was 141 bpm.The target heart rate was achieved. The heart rate response to stress was normal. There was a normal resting blood pressure with an appropriate response to stress. The rate-pressure product for the peak heart rate and blood pressure was 8832 mm Hg/min.  The patient experienced no chest pain during stress.  ------------------------------------------------------------------- Stress ECG:  Atrial fibrillation at baseline and stress. There was 2 mm hortizontal/ downsloping ST depression at stress in several leads with elevation in aVR.  ------------------------------------------------------------------- Baseline:  Peak stress:  ------------------------------------------------------------------- Stress echo results:     EF 65% at  baseline and 75% with exercise. Study not able to determine echocardiographic evidence of ischemia due to lack of standard protocol. Left ventricular ejection fraction was normal at rest and with stress.  ------------------------------------------------------------------- Measurements   Left atrium                     Value  LA volume, S                    145   ml  LA volume/bsa, S                83.8  ml/m^2  LA volume, ES, 1-p A4C          122   ml  LA volume/bsa, ES, 1-p A4C      70.5  ml/m^2  LA volume, ES, 1-p A2C          157   ml  LA volume/bsa, ES, 1-p A2C  90.7  ml/m^2    Mitral valve                    Value  Mitral mean velocity, D         180   cm/s  Mitral mean gradient, D         14    mm Hg  Mitral annulus VTI, D           37    cm  Mitral regurg VTI, PISA         156   cm  Mitral ERO, PISA                0.48  cm^2  Mitral regurg volume, PISA      75    ml  Legend: (L)  and  (H)  mark values outside specified reference range.  ------------------------------------------------------------------- Prepared and Electronically Authenticated by  Candee Furbish, M.D. 2017-11-01T11:32:32   Right/Left Heart Cath and Coronary Angiography  Conclusion     The left ventricular systolic function is normal.  LV end diastolic pressure is normal.  The left ventricular ejection fraction is 55-65% by visual estimate.  There is no aortic valve stenosis.  There is severe mitral valve stenosis.  Prox Cx to Mid Cx lesion, 50 %stenosed.  Mid RCA-2 lesion, 80 %stenosed.  Mid RCA-1 lesion, 80 %stenosed.  Hemodynamic findings consistent with mild pulmonary hypertension.  LV end diastolic pressure is normal.   1. Coronary artery disease   - 50% mid LCx   - 80% sequential lesions in the mid RCA 2. Normal LV function 3. Severe mitral stenosis. MV gradient of 13 mm Hg. MVA 0.99 cm squared with index 0.57. 4. Moderate to severe mitral insufficiency 5. Mild  pulmonary HTN. 6. Normal LV EDP 7. No significant AV gradient 8. Occluded right brachial artery.  Plan: surgical evaluation for MVR and CABG.   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 Noninvasive evaluation is consistent with significant MR and MS.  Procedural Details: The right wrist was prepped, draped, and anesthetized with 1% lidocaine. Using the modified Seldinger technique a 6 Fr slender sheath was placed in the right radial artery. We were unable to advance a wire past the brachial artery and angiography demonstrated occlusion of the brachial artery above the elbow. We then prepped and draped the right groin. It was anesthetized with 1% lidocaine and a 5 Fr sheath was inserted in the right femoral artery using a modified Seldinger technique. A 5 French sheath was placed in the left brachial vein exchanging out the IV catheter. A Swan-Ganz catheter was used for the right heart catheterization. Standard protocol was followed for recording of right heart pressures and sampling of oxygen saturations. Fick cardiac output was calculated. Standard Judkins catheters were used for selective coronary angiography and left ventriculography. There were no immediate procedural complications. The patient was transferred to the post catheterization recovery area for further monitoring.  Contrast: 70 cc   Estimated blood loss <50 mL.  During this procedure the patient was administered the following to achieve and maintain moderate conscious sedation: Versed 1 mg, Fentanyl 25 mcg, while the patient's heart rate, blood pressure, and oxygen saturation were continuously monitored. The period of conscious sedation was 51 minutes, of which I was present face-to-face 100% of this  time.    Complications    Complications documented before study signed (08/03/2016 12:27 PM EST)    No complications were associated with this study.  Documented by Peter M Martinique, MD - 08/03/2016 12:23 PM EST    Coronary Findings   Dominance: Right  Left Main  Vessel was injected. Vessel is normal in caliber. Vessel is angiographically normal.  Left Anterior Descending  Vessel was injected. Vessel is normal in caliber. Vessel is angiographically normal.  Left Circumflex  Prox Cx to Mid Cx lesion, 50% stenosed.  Right Coronary Artery  Mid RCA-1 lesion, 80% stenosed.  Mid RCA-2 lesion, 80% stenosed.  Right Heart   Right Heart Pressures Hemodynamic findings consistent with mild pulmonary hypertension. LV EDP is normal.    Wall Motion              Left Heart   Left Ventricle The left ventricular size is normal. The left ventricular systolic function is normal. LV end diastolic pressure is normal. The left ventricular ejection fraction is 55-65% by visual estimate. No regional wall motion abnormalities. There is moderate to severe mitral regurgitation.    Mitral Valve There is severe mitral valve stenosis.    Aortic Valve There is no aortic valve stenosis.    Coronary Diagrams   Diagnostic Diagram     Implants     No implant documentation for this case.  PACS Images   Show images for Cardiac catheterization   Link to Procedure Log   Procedure Log    Hemo Data   Flowsheet Row Most Recent Value  Fick Cardiac Output 3.9 L/min  Fick Cardiac Output Index 2.27 (L/min)/BSA  Mitral Mean Gradient 13.1 mmHg  Mitral Peak Gradient 9 mmHg  Mitral Valve Area Index 0.57 cm2/BSA  RA A Wave 14 mmHg  RA V Wave 17 mmHg  RA Mean 14 mmHg  RV Systolic Pressure 52 mmHg  RV Diastolic Pressure 2 mmHg  RV EDP 14 mmHg  PA Systolic Pressure 51 mmHg  PA Diastolic Pressure 20 mmHg  PA Mean 34 mmHg  PW A Wave 32 mmHg  PW V Wave 36 mmHg  PW Mean 31 mmHg  AO Systolic Pressure [id number] mmHg  AO Diastolic  Pressure 66 mmHg  AO Mean 91 mmHg  LV Systolic Pressure 0000000 mmHg  LV Diastolic Pressure 10 mmHg  LV EDP 22 mmHg  Arterial Occlusion Pressure Extended Systolic Pressure [id number] mmHg  Arterial Occlusion Pressure Extended Diastolic Pressure 66 mmHg  Arterial Occlusion Pressure Extended Mean Pressure 92 mmHg  Left Ventricular Apex Extended Systolic Pressure [id number] mmHg  Left Ventricular Apex Extended Diastolic Pressure 10 mmHg  Left Ventricular Apex Extended EDP Pressure 22 mmHg  QP/QS 1  TPVR Index 14.99 HRUI  TSVR Index 40.1 HRUI  PVR SVR Ratio 0.09  TPVR/TSVR Ratio 0.37      Impression:  Patient has long-standing rheumatic heart disease with history of previous open mitral commissurotomy in 1970 and now presents with stage D severe symptomatic mitral regurgitation and mitral stenosis. She describes a long-standing history of symptoms consistent with chronic diastolic congestive heart failure that of progressed gradually over the last several years and are now quite limiting, consistent with New [place] Heart Association functional class III. The patient also has more than 30 years history of long-standing persistent atrial fibrillation on long-term anticoagulation using warfarin.  I have personally reviewed the patient's recent echocardiograms and diagnostic cardiac catheterization. Echocardiograms revealed classical rheumatic mitral valve disease with severe thickening and restricted leaflet mobility  involving both leaflets of the mitral valve with severe thickening and foreshortening of the subvalvular apparatus. There is type IIIA mitral valve dysfunction with severe mitral regurgitation.   There is massive enlargement of both left and right atrium. There is only mild tricuspid regurgitation but the tricuspid annulus does appear somewhat dilated. Left ventricular systolic function is normal. There is no significant aortic valve disease. Diagnostic cardiac catheterization confirmed the presence of  normal left ventricular systolic function and reveals only mild pulmonary hypertension, but also revealed multivessel coronary artery disease with tandem 80% stenosis in the right coronary artery.  I agree the patient needs mitral valve replacement and coronary artery bypass grafting. She might benefit from concomitant maze procedure in terms of improved rhythm control and decreased risk of thromboembolism, although the likelihood of restoration of sinus rhythm is probably very low because of the long duration of atrial fibrillation. There is a significant likelihood that the patient might require permanent pacemaker placement because of symptomatic bradycardia if maze procedure is performed. Risks associated with surgery will be somewhat elevated because of the patient's advanced age, previous cardiac surgery, long-standing rheumatic disease, and other comorbid medical problems.   Plan:  The patient and her husband were counseled at length regarding the indications, risks and potential benefits of mitral valve replacement and coronary artery bypass grafting.  The rationale for elective surgery has been explained, including a comparison between surgery and continued medical therapy with close follow-up.  We discussed the possibility of replacing the mitral valve using a mechanical prosthesis with the attendant need for long-term anticoagulation versus the alternative of replacing it using a bioprosthetic tissue valve with its potential for late structural valve deterioration and failure, depending upon the patient's longevity.  The patient specifically requests that if the mitral valve must be replaced that it be done using a bioprosthetic tissue valve valve.   The patient understands and accepts all potential risks of surgery including but not limited to risk of death, stroke or other neurologic complication, myocardial infarction, congestive heart failure, respiratory failure, renal failure, bleeding  requiring transfusion and/or reexploration, arrhythmia, infection or other wound complications, pneumonia, pleural and/or pericardial effusion, pulmonary embolus, aortic dissection or other major vascular complication, recurrence of symptomatic ischemic heart disease, or delayed complications related to valve repair or replacement including but not limited to structural valve deterioration and failure, thrombosis, embolization, endocarditis, or paravalvular leak.  The relative risks and benefits of performing a maze procedure at the time of their surgery was discussed at length, including the expected likelihood of long term freedom from recurrent symptomatic atrial fibrillation and/or atrial flutter as well as the increased risk of need for permanent pacemaker placement.  All of their questions have been answered.  The patient hopes to proceed with surgery as soon as practical. We tentatively plan to proceed with surgery on Thursday, 08/26/2016.  We will obtain CT angiogram of the aorta and iliac vessels and the patient will return for follow-up on 08/16/2016.  At that time we will make final plans for surgery, discuss bridging for anticoagulation and begin the patient on amiodarone.   I spent in excess of 90 minutes during the conduct of this office consultation and >50% of this time involved direct face-to-face encounter with the patient for counseling and/or coordination of their care.   Valentina Gu. Roxy Manns, MD 08/04/2016 4:29 PM

## 2016-08-04 NOTE — Patient Instructions (Signed)
Continue all previous medications without any changes at this time  

## 2016-08-05 ENCOUNTER — Other Ambulatory Visit: Payer: Self-pay | Admitting: *Deleted

## 2016-08-05 DIAGNOSIS — I2510 Atherosclerotic heart disease of native coronary artery without angina pectoris: Secondary | ICD-10-CM

## 2016-08-05 DIAGNOSIS — I050 Rheumatic mitral stenosis: Secondary | ICD-10-CM

## 2016-08-05 DIAGNOSIS — I4891 Unspecified atrial fibrillation: Secondary | ICD-10-CM

## 2016-08-05 DIAGNOSIS — I340 Nonrheumatic mitral (valve) insufficiency: Secondary | ICD-10-CM

## 2016-08-09 ENCOUNTER — Ambulatory Visit (INDEPENDENT_AMBULATORY_CARE_PROVIDER_SITE_OTHER): Payer: Medicare Other

## 2016-08-09 ENCOUNTER — Other Ambulatory Visit (INDEPENDENT_AMBULATORY_CARE_PROVIDER_SITE_OTHER): Payer: Medicare Other

## 2016-08-09 DIAGNOSIS — Z7901 Long term (current) use of anticoagulants: Secondary | ICD-10-CM | POA: Diagnosis not present

## 2016-08-09 DIAGNOSIS — I4891 Unspecified atrial fibrillation: Secondary | ICD-10-CM

## 2016-08-09 DIAGNOSIS — E876 Hypokalemia: Secondary | ICD-10-CM

## 2016-08-09 DIAGNOSIS — Z5181 Encounter for therapeutic drug level monitoring: Secondary | ICD-10-CM

## 2016-08-09 LAB — BASIC METABOLIC PANEL
BUN: 12 mg/dL (ref 6–23)
CHLORIDE: 103 meq/L (ref 96–112)
CO2: 32 meq/L (ref 19–32)
Calcium: 9.7 mg/dL (ref 8.4–10.5)
Creatinine, Ser: 0.76 mg/dL (ref 0.40–1.20)
GFR: 78.01 mL/min (ref >60.00)
Glucose, Bld: 97 mg/dL (ref 70–99)
Potassium: 4 mEq/L (ref 3.5–5.1)
SODIUM: 144 meq/L (ref 135–145)

## 2016-08-09 LAB — POCT INR: INR: 1.6

## 2016-08-09 NOTE — Patient Instructions (Signed)
Pre visit review using our clinic review tool, if applicable. No additional management support is needed unless otherwise documented below in the visit note. 

## 2016-08-10 ENCOUNTER — Other Ambulatory Visit: Payer: Self-pay

## 2016-08-10 ENCOUNTER — Other Ambulatory Visit: Payer: Medicare Other

## 2016-08-10 MED ORDER — TRIAMTERENE-HCTZ 37.5-25 MG PO TABS: 1.0000 | ORAL_TABLET | Freq: Every day | ORAL | 2 refills | Status: DC

## 2016-08-11 ENCOUNTER — Telehealth: Payer: Self-pay | Admitting: *Deleted

## 2016-08-11 NOTE — Telephone Encounter (Signed)
Notes Recorded by Earvin Hansen on 08/04/2016 at 5:12 PM EST Patient already taking K+ 80 meq. Discussed with Claiborne Billings D and will increase to 100 meq daily. She also recommended a magnesium level Confirmed dose with patient and advised of increase She will get at PCP next week

## 2016-08-11 NOTE — Telephone Encounter (Signed)
-----   Message from [name], MD sent at 08/02/2016  6:26 PM EST ----- Potassium levels remain low.   Recommend taking Kdur 71mEq daily.

## 2016-08-13 ENCOUNTER — Ambulatory Visit: Payer: Medicare Other | Admitting: Cardiovascular Disease

## 2016-08-13 ENCOUNTER — Other Ambulatory Visit: Payer: Medicare Other

## 2016-08-13 ENCOUNTER — Ambulatory Visit
Admission: RE | Admit: 2016-08-13 | Discharge: 2016-08-13 | Disposition: A | Discharge status: Home / Self Care | Payer: Medicare Other | Source: Ambulatory Visit | Attending: Thoracic Surgery (Cardiothoracic Vascular Surgery) | Admitting: Thoracic Surgery (Cardiothoracic Vascular Surgery)

## 2016-08-13 DIAGNOSIS — I700 Atherosclerosis of aorta: Secondary | ICD-10-CM | POA: Diagnosis not present

## 2016-08-13 DIAGNOSIS — I050 Rheumatic mitral stenosis: Secondary | ICD-10-CM

## 2016-08-13 DIAGNOSIS — Z01818 Encounter for other preprocedural examination: Secondary | ICD-10-CM | POA: Diagnosis not present

## 2016-08-13 MED ORDER — IOPAMIDOL (ISOVUE-370) INJECTION 76%: 75.0000 mL | Freq: Once | INTRAVENOUS | Status: DC | PRN

## 2016-08-16 ENCOUNTER — Encounter: Payer: Medicare Other | Admitting: Thoracic Surgery (Cardiothoracic Vascular Surgery)

## 2016-08-17 ENCOUNTER — Other Ambulatory Visit: Payer: Self-pay | Admitting: *Deleted

## 2016-08-17 ENCOUNTER — Encounter: Payer: Self-pay | Admitting: Thoracic Surgery (Cardiothoracic Vascular Surgery)

## 2016-08-17 ENCOUNTER — Ambulatory Visit (INDEPENDENT_AMBULATORY_CARE_PROVIDER_SITE_OTHER): Payer: Medicare Other | Admitting: Thoracic Surgery (Cardiothoracic Vascular Surgery)

## 2016-08-17 VITALS — BP 137/79 | HR 84 | Resp 20 | Ht 64.0 in | Wt 148.0 lb

## 2016-08-17 DIAGNOSIS — I099 Rheumatic heart disease, unspecified: Secondary | ICD-10-CM

## 2016-08-17 DIAGNOSIS — I050 Rheumatic mitral stenosis: Secondary | ICD-10-CM | POA: Diagnosis not present

## 2016-08-17 DIAGNOSIS — L03116 Cellulitis of left lower limb: Secondary | ICD-10-CM

## 2016-08-17 DIAGNOSIS — I4819 Other persistent atrial fibrillation: Secondary | ICD-10-CM

## 2016-08-17 DIAGNOSIS — I481 Persistent atrial fibrillation: Secondary | ICD-10-CM

## 2016-08-17 DIAGNOSIS — I340 Nonrheumatic mitral (valve) insufficiency: Secondary | ICD-10-CM

## 2016-08-17 DIAGNOSIS — I2510 Atherosclerotic heart disease of native coronary artery without angina pectoris: Secondary | ICD-10-CM | POA: Diagnosis not present

## 2016-08-17 DIAGNOSIS — I5032 Chronic diastolic (congestive) heart failure: Secondary | ICD-10-CM

## 2016-08-17 HISTORY — DX: Cellulitis of left lower limb: L03.116

## 2016-08-17 MED ORDER — CEPHALEXIN 500 MG PO CAPS: 500.0000 mg | ORAL_CAPSULE | Freq: Three times a day (TID) | ORAL | 0 refills | Status: DC

## 2016-08-17 NOTE — Patient Instructions (Addendum)
Cancel plans for surgery  Begin taking Keflex as soon as possible  Keep right leg elevated as much as possible when you aren't walking  Apply Neosporin ointment to ulcer on leg daily

## 2016-08-17 NOTE — Progress Notes (Signed)
[address]       [place] [address]             [phone]     CARDIOTHORACIC SURGERY OFFICE NOTE  Referring Provider is [name], MD PCP is [name], MD   HPI:  Patient returns to the office today for follow-up of rheumatic heart disease with stage D severe symptomatic mitral regurgitation and mitral stenosis, coronary artery disease, and long standing persistent atrial fibrillation. She was originally seen in consultation on 08/04/2016 at which time we tentatively make plans for surgery later this month.  Since then she underwent CT angiography and she returns to our office today for follow-up. She states that approximately one week ago she stumbled and bumped her right lower leg. She has developed an ulceration and it is quite sore. She has not had fevers or chills. She reports no changes in her baseline symptoms of exertional shortness of breath.   Current Outpatient Prescriptions  Medication Sig Dispense Refill  . acetaminophen (TYLENOL) 500 MG tablet Take 500 mg by mouth every 6 (six) hours as needed for mild pain.    Marland Kitchen atenolol (TENORMIN) 25 MG tablet Take 0.5 tablets (12.5 mg total) by mouth daily. 45 tablet 2  . B Complex-C (B-COMPLEX WITH VITAMIN C) tablet Take 1 tablet by mouth daily.    . budesonide-formoterol (SYMBICORT) 80-4.5 MCG/ACT inhaler Inhale 2 puffs into the lungs 2 (two) times daily. 1 Inhaler 11  . diazepam (VALIUM) 5 MG tablet Take 1 tablet (5 mg total) by mouth at bedtime. 30 tablet 5  . furosemide (LASIX) 80 MG tablet Take 40 mg by mouth 2 (two) times daily.    . Multiple Vitamin (MULTIVITAMIN) tablet Take 1 tablet by mouth daily.      . potassium chloride (K-DUR) 10 MEQ tablet Take 20 mEq by mouth 5 (five) times daily.     Marland Kitchen PROAIR HFA 108 (90 Base) MCG/ACT inhaler 2 puffs every 6 hours as needed for wheezi ng or shortness of breath. 8.5 g 2  . triamterene-hydrochlorothiazide (MAXZIDE-25) 37.5-25 MG tablet Take 1  tablet by mouth daily. 90 tablet 2  . warfarin (COUMADIN) 2 MG tablet Take 2-3 mg by mouth one time only at 6 PM. Take 2 mgs daily on all days of the week except Mon and Fri take 3mg s daily    . enoxaparin (LOVENOX) 100 MG/ML injection Inject 1 mL (100 mg total) into the skin daily. On 11/4, 11/5, and 11/8 (Patient not taking: Reported on 08/17/2016) 5 Syringe 0  . ezetimibe (ZETIA) 10 MG tablet Take 1 tablet (10 mg total) by mouth daily. 30 tablet 5   No current facility-administered medications for this visit.       Physical Exam:   BP 137/79 (BP Location: Right Arm, Patient Position: Sitting, Cuff Size: Normal)   Pulse 84   Resp 20   Ht 5\' 4"  (1.626 m)   Wt 148 lb (67.1 kg)   SpO2 97% Comment: RA  BMI 25.40 kg/m   General:  Elderly but well-appearing  Chest:   Clear to auscultation  CV:   Irregular rate and rhythm  Incisions:  n/a  Abdomen:  Soft nontender  Extremities:  Warm and well-perfused. The patient has a dry eschar on the anterior surface of the right lower leg with significant surrounding cellulitis. There is no purulence. The patient has severe chronic venous insufficiency with chronic skin changes on both lower legs.  Diagnostic Tests:  CT ANGIOGRAPHY CHEST, ABDOMEN AND PELVIS  TECHNIQUE: Multidetector CT imaging through the chest, abdomen and pelvis was performed using the standard protocol during bolus administration of intravenous contrast. Multiplanar reconstructed images and MIPs were obtained and reviewed to evaluate the vascular anatomy.  CONTRAST:  75 mL Isovue 370  COMPARISON:  10/27/2015 and 07/30/2007  FINDINGS: CTA CHEST FINDINGS  Cardiovascular: Normal caliber of the thoracic aorta. There is some atherosclerotic disease at the aortic arch. Atherosclerotic calcifications at the origin of left subclavian artery and left common carotid artery. Great vessels are patent. Normal arch configuration. Mild atherosclerotic disease along the  descending thoracic aorta. Negative for an aortic dissection. Pulmonary arteries are not opacified on this examination. Left atrium is markedly enlarged measuring 6.6 cm in the AP dimension and compatible with mitral valve disease.  Mediastinum/Nodes: There is no significant chest lymphadenopathy. No significant pericardial fluid.  Lungs/Pleura: No pleural effusions. Trachea and mainstem bronchi are patent. Punctate pleural-based nodule in the right upper lobe on sequence 5, image 23. Few additional tiny nodules in the right lower lobe on image 36, largest measuring roughly 3 mm. There is no significant airspace disease or consolidation in the lungs. There is mild pleural thickening at the left lung base. 3 mm nodule in the superior segment of left lower lobe on image 30. No evidence for pulmonary edema.  Musculoskeletal: Surgical plate in the lower cervical spine.  Review of the MIP images confirms the above findings.  CTA ABDOMEN AND PELVIS FINDINGS  VASCULAR  Aorta: Mild atherosclerotic disease in the abdominal aorta without aneurysm.  Celiac: Celiac trunk is patent. Mild narrowing along the origin probably related to the median arcuate ligament. Main branch vessels are patent.  SMA: SMA is patent with a replaced right hepatic artery. No significant stenosis of the SMA.  Renals: Both renal arteries are patent without evidence of aneurysm, dissection, vasculitis, fibromuscular dysplasia or significant stenosis.  IMA: IMA is patent without significant stenosis.  Inflow: Right common iliac artery has atherosclerotic disease without significant stenosis. This vessel is slightly small measuring 0.9 cm in diameter. Right external and right internal iliac arteries are patent. Right iliac arteries are tortuous. No significant stenosis or atherosclerotic disease in the right external iliac artery. Mild atherosclerotic disease in the right common femoral artery.  Small amount of fluid around the right common femoral artery compatible with recent catheterization. Negative for a pseudoaneurysm. Proximal right femoral arteries are patent. Left iliac arteries are tortuous but no significant plaque or stenosis. Mild disease in the left common femoral artery without significant stenosis. Proximal left femoral arteries are patent.  Veins: Portal venous system is patent. IVC and renal veins are widely patent. Proximal iliac veins are patent.  Review of the MIP images confirms the above findings.  NON-VASCULAR  Hepatobiliary: Gallbladder has been removed. No acute abnormality to the liver.  Pancreas: Normal appearance of the pancreas without inflammation or duct dilatation.  Spleen: Normal appearance of spleen without enlargement.  Adrenals/Urinary Tract: Normal adrenal glands. Small cyst in left kidney upper pole. No suspicious renal lesion. No hydronephrosis. Urinary bladder is unremarkable.  Stomach/Bowel: Small hiatal hernia. Multiple small densities throughout the colon probably represent ingested tablets. No evidence for bowel obstruction or focal bowel inflammation.  Lymphatic: No significant lymph node enlargement in the abdomen or pelvis.  Reproductive: Status post hysterectomy. No adnexal masses.  Other: Multiple surgical clips throughout the pelvis. No free fluid. No free air. Mild edema in the right groin compatible with recent catheterization.  Musculoskeletal: Pedicle screw and rod fixation at L5-S1 with interbody device.  Review of the MIP images confirms the above findings.  IMPRESSION: Mild atherosclerotic disease in the aorta, iliac arteries and proximal femoral arteries. No significant stenosis or occlusive disease. Iliac arteries are tortuous.  Changes in the right groin compatible with recent catheterization procedure but no evidence for a pseudoaneurysm.  No acute abnormality in the chest,  abdomen or pelvis.  Enlarged left atrium compatible mitral valve disease.  Few punctate nodules throughout the lungs are nonspecific. No follow-up needed if patient is low-risk (and has no known or suspected primary neoplasm). Non-contrast chest CT can be considered in 12 months if patient is high-risk. This recommendation follows the consensus statement: Guidelines for Management of Incidental Pulmonary Nodules Detected on CT Images: From the Fleischner Society 2017; Radiology 2017; 284:228-243.   Electronically Signed   By: Markus Daft M.D.   On: 08/13/2016 15:47   Impression:  Patient has developed cellulitis involving her right lower leg in the setting of severe chronic venous insufficiency with recent minor traumatic injury to the skin. Under the circumstances I feel we have no choice but to postpone plans for elective surgery.  Plan:  We will begin the patient on oral Keflex. The patient has been instructed to keep her leg elevated and to apply Neosporin daily. We will plan to see her back in approximately 2 weeks to make sure that the cellulitis has resolved and possibly reschedule surgery at that time.    I spent in excess of 30 minutes during the conduct of this office consultation and >50% of this time involved direct face-to-face encounter with the patient for counseling and/or coordination of their care.    Valentina Gu. Roxy Manns, MD 08/17/2016 4:12 PM

## 2016-08-18 ENCOUNTER — Other Ambulatory Visit: Payer: Self-pay | Admitting: *Deleted

## 2016-08-18 DIAGNOSIS — B379 Candidiasis, unspecified: Secondary | ICD-10-CM

## 2016-08-18 MED ORDER — FLUCONAZOLE 150 MG PO TABS: 150.0000 mg | ORAL_TABLET | Freq: Every day | ORAL | 0 refills | Status: AC

## 2016-08-23 ENCOUNTER — Other Ambulatory Visit: Payer: Self-pay | Admitting: *Deleted

## 2016-08-23 DIAGNOSIS — L03115 Cellulitis of right lower limb: Secondary | ICD-10-CM

## 2016-08-23 MED ORDER — CEPHALEXIN 500 MG PO CAPS: 500.0000 mg | ORAL_CAPSULE | Freq: Three times a day (TID) | ORAL | 0 refills | Status: DC

## 2016-08-24 ENCOUNTER — Ambulatory Visit (HOSPITAL_COMMUNITY): Payer: Medicare Other

## 2016-08-24 ENCOUNTER — Inpatient Hospital Stay (HOSPITAL_COMMUNITY): Admission: RE | Admit: 2016-08-24 | Payer: Medicare Other | Source: Ambulatory Visit

## 2016-08-26 ENCOUNTER — Inpatient Hospital Stay: Admit: 2016-08-26 | Payer: Medicare Other | Admitting: Thoracic Surgery (Cardiothoracic Vascular Surgery)

## 2016-08-26 SURGERY — REPLACEMENT, MITRAL VALVE
Anesthesia: General

## 2016-08-31 ENCOUNTER — Encounter: Payer: Medicare Other | Admitting: Thoracic Surgery (Cardiothoracic Vascular Surgery)

## 2016-08-31 ENCOUNTER — Telehealth: Payer: Self-pay

## 2016-08-31 ENCOUNTER — Ambulatory Visit (INDEPENDENT_AMBULATORY_CARE_PROVIDER_SITE_OTHER): Payer: Medicare Other | Admitting: Thoracic Surgery (Cardiothoracic Vascular Surgery)

## 2016-08-31 ENCOUNTER — Encounter: Payer: Self-pay | Admitting: Thoracic Surgery (Cardiothoracic Vascular Surgery)

## 2016-08-31 VITALS — BP 134/84 | HR 72 | Resp 20 | Ht 64.0 in | Wt 148.0 lb

## 2016-08-31 DIAGNOSIS — I5032 Chronic diastolic (congestive) heart failure: Secondary | ICD-10-CM

## 2016-08-31 DIAGNOSIS — I481 Persistent atrial fibrillation: Secondary | ICD-10-CM

## 2016-08-31 DIAGNOSIS — L03115 Cellulitis of right lower limb: Secondary | ICD-10-CM

## 2016-08-31 DIAGNOSIS — I2510 Atherosclerotic heart disease of native coronary artery without angina pectoris: Secondary | ICD-10-CM

## 2016-08-31 DIAGNOSIS — I38 Endocarditis, valve unspecified: Secondary | ICD-10-CM

## 2016-08-31 DIAGNOSIS — L97919 Non-pressure chronic ulcer of unspecified part of right lower leg with unspecified severity: Secondary | ICD-10-CM

## 2016-08-31 DIAGNOSIS — I050 Rheumatic mitral stenosis: Secondary | ICD-10-CM

## 2016-08-31 DIAGNOSIS — I4819 Other persistent atrial fibrillation: Secondary | ICD-10-CM

## 2016-08-31 DIAGNOSIS — I340 Nonrheumatic mitral (valve) insufficiency: Secondary | ICD-10-CM

## 2016-08-31 DIAGNOSIS — I872 Venous insufficiency (chronic) (peripheral): Secondary | ICD-10-CM

## 2016-08-31 NOTE — Progress Notes (Signed)
[address]       [place] [address]             [phone]     CARDIOTHORACIC SURGERY OFFICE NOTE  Referring Provider is [name], MD PCP is [name], MD   HPI:  Patient is a 81 year old female with multiple medical problems who returns to the office today for follow-up of rheumatic heart disease with stage D severe symptomatic mitral regurgitation and mitral stenosis, coronary artery disease, and long standing persistent atrial fibrillation.  She was originally seen in consultation on 08/04/2016 and we had previously plan to proceed with elective redo mitral valve replacement, coronary artery bypass grafting and Maze procedure on 08/26/2016. However, when she was last seen here in our office on 08/17/2016 she had recently stumbled and injured her right lower leg, causing the development of an ulceration on the anterior surface of her right lower leg associated with significant cellulitis. Surgery was postponed and the patient was given a 2 week prescription for oral Keflex. She was instructed to keep her leg elevated as much as possible, keep the wound clean and dry, and apply Neosporin ointment daily.  She returns to our office for follow-up today. She reports stable symptoms of exertional shortness of breath and fatigue, but she knows that she gets short of breath quite easily. She hopes to proceed with her heart surgery in the reasonably near future. She states that the soreness in her right leg has improved, and there has not been any associated drainage. She has not had fevers and chills. However, she notes that the ulceration has not shown much of any sign of healing and there remained some surrounding redness and tenderness.   Current Outpatient Prescriptions  Medication Sig Dispense Refill  . acetaminophen (TYLENOL) 500 MG tablet Take 500 mg by mouth every 6 (six) hours as needed for mild pain.    Marland Kitchen atenolol (TENORMIN) 25 MG tablet Take 0.5  tablets (12.5 mg total) by mouth daily. 45 tablet 2  . B Complex-C (B-COMPLEX WITH VITAMIN C) tablet Take 1 tablet by mouth daily.    . budesonide-formoterol (SYMBICORT) 80-4.5 MCG/ACT inhaler Inhale 2 puffs into the lungs 2 (two) times daily. 1 Inhaler 11  . diazepam (VALIUM) 5 MG tablet Take 1 tablet (5 mg total) by mouth at bedtime. 30 tablet 5  . furosemide (LASIX) 80 MG tablet Take 40 mg by mouth 2 (two) times daily.    . Multiple Vitamin (MULTIVITAMIN) tablet Take 1 tablet by mouth daily.      . potassium chloride (K-DUR) 10 MEQ tablet Take 20 mEq by mouth 5 (five) times daily.     Marland Kitchen PROAIR HFA 108 (90 Base) MCG/ACT inhaler 2 puffs every 6 hours as needed for wheezi ng or shortness of breath. 8.5 g 2  . triamterene-hydrochlorothiazide (MAXZIDE-25) 37.5-25 MG tablet Take 1 tablet by mouth daily. 90 tablet 2  . warfarin (COUMADIN) 2 MG tablet Take 2-3 mg by mouth one time only at 6 PM. Take 2 mgs daily on all days of the week except Mon and Fri take 3mg s daily    . enoxaparin (LOVENOX) 100 MG/ML injection Inject 1 mL (100 mg total) into the skin daily. On 11/4, 11/5, and 11/8 (Patient not taking: Reported on 08/31/2016) 5 Syringe 0  . ezetimibe (ZETIA) 10 MG tablet Take 1 tablet (10 mg total) by mouth daily. 30 tablet 5   No current facility-administered medications for this visit.  Physical Exam:   BP 134/84   Pulse 72   Resp 20   Ht 5\' 4"  (1.626 m)   Wt 148 lb (67.1 kg)   SpO2 96% Comment: RA  BMI 25.40 kg/m   General:  Well-appearing  Chest:   Clear to auscultation  CV:   Irregular rate and rhythm with systolic murmur  Incisions:  n/a  Abdomen:  Soft nontender  Extremities:  Warm and well-perfused. There remains an open clean superficial wound on the anterior surface of the right lower leg with mild surrounding cellulitis. This looks somewhat improved in comparison with how it appeared at the time of her last office visit, but there remains erythema with blanching to the  touch and mild tenderness. There is mild surrounding edema. The patient has severe chronic venous insufficiency with varicose veins.  Diagnostic Tests:  n/a   Impression:  The patient's right lower leg ulceration appears somewhat improved but there has been little healing over the past 2 weeks and there remains significant erythema consistent with mild cellulitis. This may be primarily related to the patient's underlying chronic venous insufficiency.  The patient remains stable from a cardiovascular standpoint, although she is eager to proceed with surgery as soon as practical as she continues to experience exertional shortness of breath with relatively low level activity.   Plan:  I feel that formal vascular surgical consultation may be warranted to consider other means to address the patient's underlying venous insufficiency. The patient may need to be referred to the wound clinic for long-term management.  We will defer a decision regarding continued antibiotic treatment to their discretion. We will tentatively plan to proceed with redo mitral valve replacement, coronary artery bypass grafting, and Maze procedure on 10/14/2016. However, the patient will return to our office for follow-up on 10/04/2016 to make sure that her right lower leg wound has shown signs of improvement.  The patient has been reminded to schedule a follow-up appointment at the Coumadin clinic to have her INR checked as she has not done this since she resumed taking Coumadin 2 weeks ago.   I spent in excess of 15 minutes during the conduct of this office consultation and >50% of this time involved direct face-to-face encounter with the patient for counseling and/or coordination of their care.    Catherine [name]. Roxy Manns, MD 08/31/2016 4:34 PM

## 2016-08-31 NOTE — Telephone Encounter (Signed)
rec'd request from Dr. Roxy Manns for appt. within one week for "Severe Venous Insufficiency with Nonhealing Ulcer, and Cellulitis of Right LE."  Also, the pt. is to be scheduled for Redo MVR, CABG, and Maze procedure in January 2018.  Advised will have a Scheduler call back with appt. Information.

## 2016-08-31 NOTE — Patient Instructions (Addendum)
Continue all previous medications without any changes at this time  Keep your right leg elevated at all times while not up walking  Schedule blood draw to check prothrombin time - INR at coumadin clinic ASAP

## 2016-09-01 ENCOUNTER — Other Ambulatory Visit: Payer: Self-pay | Admitting: *Deleted

## 2016-09-01 DIAGNOSIS — I050 Rheumatic mitral stenosis: Secondary | ICD-10-CM

## 2016-09-01 DIAGNOSIS — I2510 Atherosclerotic heart disease of native coronary artery without angina pectoris: Secondary | ICD-10-CM

## 2016-09-01 DIAGNOSIS — I4891 Unspecified atrial fibrillation: Secondary | ICD-10-CM

## 2016-09-01 DIAGNOSIS — I340 Nonrheumatic mitral (valve) insufficiency: Secondary | ICD-10-CM

## 2016-09-01 MED ORDER — GLUTARALDEHYDE 0.625% SOAKING SOLUTION: TOPICAL | Status: DC | PRN

## 2016-09-01 NOTE — Telephone Encounter (Signed)
Sched appt 09/03/16 at 10:30 with Dr. Donzetta Matters. Spoke to pt to inform them of appt. Lm for TCTS to inform them of appt.

## 2016-09-02 ENCOUNTER — Encounter: Payer: Self-pay | Admitting: Vascular Surgery

## 2016-09-03 ENCOUNTER — Encounter: Payer: Self-pay | Admitting: Vascular Surgery

## 2016-09-03 ENCOUNTER — Ambulatory Visit (INDEPENDENT_AMBULATORY_CARE_PROVIDER_SITE_OTHER): Payer: Self-pay | Admitting: General Practice

## 2016-09-03 ENCOUNTER — Ambulatory Visit (INDEPENDENT_AMBULATORY_CARE_PROVIDER_SITE_OTHER): Payer: Medicare Other | Admitting: Vascular Surgery

## 2016-09-03 VITALS — BP 140/73 | HR 76 | Temp 97.7°F | Resp 14 | Ht 64.0 in | Wt 151.0 lb

## 2016-09-03 DIAGNOSIS — I739 Peripheral vascular disease, unspecified: Secondary | ICD-10-CM | POA: Diagnosis not present

## 2016-09-03 DIAGNOSIS — Z5181 Encounter for therapeutic drug level monitoring: Secondary | ICD-10-CM

## 2016-09-03 LAB — POCT INR: INR: 4.2

## 2016-09-03 NOTE — Progress Notes (Signed)
Vitals:   09/03/16 1024  BP: (!) 145/76  Pulse: 76  Resp: 14  Temp: 97.7 F (36.5 C)  SpO2: 99%  Weight: 151 lb (68.5 kg)  Height: 5\' 4"  (1.626 m)

## 2016-09-03 NOTE — Patient Instructions (Signed)
Pre visit review using our clinic review tool, if applicable. No additional management support is needed unless otherwise documented below in the visit note. 

## 2016-09-03 NOTE — Progress Notes (Signed)
Patient ID: Catherine [name], female   DOB: 23-Feb-1935, 81 y.o.   MRN: [id number]  Reason for Consult: New Evaluation (ulcer to right lower extremity)   Referred by Rexene Alberts, MD  Subjective:     HPI:  Catherine [name] is a 81 y.o. female who was originally scheduled for redo mitral valve replacement with CABG and Maze procedure in November was found to have a nonhealing ulceration of her right leg. This wound has been there for over a month and was from a low-grade trauma. She is not had fevers and did initially have an associated erythema but that has mostly resolved. She does take Coumadin for her previous valve and atrial fibrillation. She has never had a known DVT. Her limitation to walking is her shortness of breath and she does not appear to have any lower extremity limitations. She is a former smoker but quit many years ago. She does not take aspirin daily but is on zetia for high cholesterol. She states that her wound on her leg is healing although it is slower than she would like and she is concerned about procedure as is Dr. Roxy Manns while it is ongoing.  Past Medical History:  Diagnosis Date  . Arthritis   . Asthma    last attack 02/2015  . Atrial fibrillation, chronic ([place])   . Cellulitis of left lower extremity 08/17/2016   Ulcer associated with severe venous insufficiency  . Chronic anticoagulation   . Chronic diastolic CHF (congestive heart failure) ([place])   . Chronic kidney disease    "RIGHT MANY KIDNEY INFECTIONS AND STONES"  . COPD (chronic obstructive pulmonary disease) ([place])   . Coronary artery disease   . H/O: rheumatic fever   . Heart murmur   . Hypertension   . PONV (postoperative nausea and vomiting)    ' SOMETIMES', BUT NOT ALWAYS"  . UTI (urinary tract infection)   . Valvular heart disease    Has mitral stenosis with prior mitral commissurotomy in 1970   Family History  Problem Relation Age of Onset  . Leukemia Father    Past Surgical History:    Procedure Laterality Date  . ABDOMINAL HYSTERECTOMY  1983   endometriosis  . BACK SURGERY     neurosurgery  . CARDIAC CATHETERIZATION    . CARDIAC CATHETERIZATION N/A 08/03/2016   Procedure: Right/Left Heart Cath and Coronary Angiography;  Surgeon: Peter M Martinique, MD;  Location: [facility];  Service: Cardiovascular;  Laterality: N/A;  . cataract surg    . CHOLECYSTECTOMY N/A 05/30/2015   Procedure: LAPAROSCOPIC CHOLECYSTECTOMY WITH INTRAOPERATIVE CHOLANGIOGRAM;  Surgeon: Excell Seltzer, MD;  Location: [facility];  Service: General;  Laterality: N/A;  . EYE SURGERY    . MITRAL VALVE SURGERY Left 1970   Open mitral commissurotomy via left thoracotomy approach  . TEE WITHOUT CARDIOVERSION N/A 07/05/2016   Procedure: TRANSESOPHAGEAL ECHOCARDIOGRAM (TEE);  Surgeon: [name], MD;  Location[facility];  Service: Cardiovascular;  Laterality: N/A;    Short Social History:  Social History  Substance Use Topics  . Smoking status: Former Smoker    Packs/day: 1.00    Years: 15.00    Types: Cigarettes    Quit date: 09/28/1975  . Smokeless tobacco: Never Used  . Alcohol use No    Allergies  Allergen Reactions  . Aldactone [Spironolactone] Other (See Comments)    dyspnea  . Amoxicillin Palpitations    Tachycardia Has patient had a PCN reaction causing immediate rash, facial/tongue/throat swelling,  SOB or lightheadedness with hypotension: no Has patient had a PCN reaction causing severe rash involving mucus membranes or skin necrosis: {no Has patient had a PCN reaction that required hospitalization {no Has patient had a PCN reaction occurring within the last 10 years: {yes If all of the above answers are "NO", then may proceed with Cephalosporin use.  . Diltiazem Other (See Comments)    Causing headaches   . Flagyl [Metronidazole Hcl] Other (See Comments)    Causing headaches    . Flovent [Fluticasone Propionate] Other (See Comments)    Leg cramps  . Gabapentin Swelling   . Lyrica [Pregabalin] Swelling  . Quinidine Diarrhea and Other (See Comments)    Fever diarrhea  . Simvastatin Other (See Comments)    Leg pain, myalgia  . Tramadol Nausea Only  . Verapamil Other (See Comments)    myalgias  . Ace Inhibitors Other (See Comments)    unknown  . Benazepril Hcl Cough  . Ciprocin-Fluocin-Procin [Fluocinolone Acetonide] Other (See Comments)    unknown  . Ciprofloxacin Diarrhea  . Codeine Nausea Only  . Nitrofurantoin Monohyd Macro Nausea Only    Current Outpatient Prescriptions  Medication Sig Dispense Refill  . acetaminophen (TYLENOL) 500 MG tablet Take 500 mg by mouth every 6 (six) hours as needed for mild pain.    Marland Kitchen atenolol (TENORMIN) 25 MG tablet Take 0.5 tablets (12.5 mg total) by mouth daily. 45 tablet 2  . B Complex-C (B-COMPLEX WITH VITAMIN C) tablet Take 1 tablet by mouth daily.    . budesonide-formoterol (SYMBICORT) 80-4.5 MCG/ACT inhaler Inhale 2 puffs into the lungs 2 (two) times daily. 1 Inhaler 11  . cephALEXin (KEFLEX) 500 MG capsule     . diazepam (VALIUM) 5 MG tablet Take 1 tablet (5 mg total) by mouth at bedtime. 30 tablet 5  . enoxaparin (LOVENOX) 100 MG/ML injection Inject 1 mL (100 mg total) into the skin daily. On 11/4, 11/5, and 11/8 (Patient not taking: Reported on 08/31/2016) 5 Syringe 0  . ezetimibe (ZETIA) 10 MG tablet Take 1 tablet (10 mg total) by mouth daily. 30 tablet 5  . furosemide (LASIX) 80 MG tablet Take 40 mg by mouth 2 (two) times daily.    . Multiple Vitamin (MULTIVITAMIN) tablet Take 1 tablet by mouth daily.      . potassium chloride (K-DUR) 10 MEQ tablet Take 20 mEq by mouth 5 (five) times daily.     Marland Kitchen PROAIR HFA 108 (90 Base) MCG/ACT inhaler 2 puffs every 6 hours as needed for wheezi ng or shortness of breath. 8.5 g 2  . triamterene-hydrochlorothiazide (MAXZIDE-25) 37.5-25 MG tablet Take 1 tablet by mouth daily. 90 tablet 2  . warfarin (COUMADIN) 2 MG tablet Take 2-3 mg by mouth one time only at 6 PM. Take 2 mgs  daily on all days of the week except Mon and Fri take 3mg s daily     No current facility-administered medications for this visit.     Review of Systems  Constitutional:  Constitutional negative. HENT: HENT negative.  Eyes: Eyes negative.  Respiratory: Positive for shortness of breath.  Cardiovascular: Positive for dyspnea with exertion. Negative for claudication and leg swelling.  GI: Gastrointestinal negative.  Musculoskeletal: Musculoskeletal negative.  Skin: Positive for wound.  Neurological: Neurological negative. Hematologic: Hematologic/lymphatic negative.  Psychiatric: Psychiatric negative.        Objective:  Objective   Vitals:   09/03/16 1024 09/03/16 1028  BP: (!) 145/76 140/73  Pulse: 76 76  Resp: 14  Temp: 97.7 F (36.5 C)   SpO2: 99%   Weight: 151 lb (68.5 kg)   Height: 5\' 4"  (1.626 m)    Body mass index is 25.92 kg/m.  Physical Exam  Constitutional: She is oriented to person, place, and time. She appears well-developed.  HENT:  Head: Normocephalic.  Eyes: EOM are normal.  Neck: Normal range of motion.  Cardiovascular: Normal rate.  An irregularly irregular rhythm present.  Pulses:      Femoral pulses are 2+ on the right side, and 2+ on the left side.      Dorsalis pedis pulses are 0 on the right side, and 0 on the left side.       Posterior tibial pulses are 0 on the right side, and 0 on the left side.  Monophasic dp/pt bilaterally  Pulmonary/Chest: Effort normal.  Abdominal: Soft. She exhibits no mass.  Musculoskeletal: Normal range of motion. She exhibits no edema.  Neurological: She is alert and oriented to person, place, and time.  Psychiatric: She has a normal mood and affect. Her behavior is normal. Judgment normal.    Data: None today     Assessment/Plan:    81 year old white female presents for evaluation of right lower extremity wound with plans for cardiac surgery. She has monophasic signals in the office today with palpable  femoral pulses and is a former smoker. She does have some varicosities in her lower extremities as well. This wound is from an associated very small trauma. We took a picture of the wound and measured it today and we can reevaluate this in 2-3 weeks and we'll also get ABIs. I do not think she will need vascular intervention as I think wound is likely to heal.     Waynetta Sandy MD [facility]

## 2016-09-13 ENCOUNTER — Telehealth: Payer: Self-pay | Admitting: Cardiology

## 2016-09-13 NOTE — Telephone Encounter (Signed)
Spoke with patient's husband (DPR) who reports patient skinned her leg and it got infected. She went to vascular surgeon and leg is healing - this has all delayed her cardiac surgery.   Due to this, patient is getting very nervous about her leg, can't sleep at night related to leg and her surgery.   Patient's husband states her QHS valium is not helping her.   He would like to know if Dr. Oval Linsey can Rx something to help her nerves - advised that this may be a PCP related concern but would forward for advice anyway. He voiced understanding and will await return call regarding concerns.

## 2016-09-13 NOTE — Telephone Encounter (Signed)
New message    Pt husband verbalized that pt rt leg is infected and he wants to speak to the rn

## 2016-09-13 NOTE — Telephone Encounter (Signed)
I'm very sorry about the delay in her surgery and hope she heals quickly.  I agree, please follow up with her PCP about her anxiety.

## 2016-09-13 NOTE — Telephone Encounter (Signed)
Advised husband to contact PCP, verbalized understanding

## 2016-09-14 ENCOUNTER — Ambulatory Visit (INDEPENDENT_AMBULATORY_CARE_PROVIDER_SITE_OTHER): Payer: Medicare Other | Admitting: Family Medicine

## 2016-09-14 VITALS — BP 138/80 | HR 83 | Temp 98.4°F | Ht 64.0 in | Wt 156.0 lb

## 2016-09-14 DIAGNOSIS — I050 Rheumatic mitral stenosis: Secondary | ICD-10-CM

## 2016-09-14 DIAGNOSIS — S81801D Unspecified open wound, right lower leg, subsequent encounter: Secondary | ICD-10-CM

## 2016-09-14 DIAGNOSIS — F418 Other specified anxiety disorders: Secondary | ICD-10-CM | POA: Diagnosis not present

## 2016-09-14 MED ORDER — DIAZEPAM 5 MG PO TABS: ORAL_TABLET | ORAL | 3 refills | Status: DC

## 2016-09-14 NOTE — Patient Instructions (Signed)
Make take an extra one half table of diazepam as needed in the morning.

## 2016-09-14 NOTE — Progress Notes (Signed)
Subjective:     Patient ID: Catherine [name], female   DOB: 08-10-1935, 81 y.o.   MRN: [id number]  HPI Patient seen regarding increased anxiety symptoms. She has a very long history of chronic anxiety and has been on diazepam apparently for years. She generally takes 5 mg at night. She has rheumatic mitral stenosis and also coronary artery disease and was scheduled for valve replacement surgery but she developed a right leg ulcer following an injury with secondary infection and her surgery has been delayed. This has caused her a great deal of anxiety. She was placed recently per surgeon on Keflex for right leg wound with surrounding cellulitis.. Denies any fevers or chills. Her right leg wound is very slowly healing. No history of type 2 diabetes. Nonsmoker  She is complaining of anxiety symptoms much more than depression. She recently has taken occasionally half of a diazepam 5 mg in the morning and this has helped her anxiety symptoms significantly. She avoids caffeine.  Past Medical History:  Diagnosis Date  . Arthritis   . Asthma    last attack 02/2015  . Atrial fibrillation, chronic ([place])   . Cellulitis of left lower extremity 08/17/2016   Ulcer associated with severe venous insufficiency  . Chronic anticoagulation   . Chronic diastolic CHF (congestive heart failure) ([place])   . Chronic kidney disease    "RIGHT MANY KIDNEY INFECTIONS AND STONES"  . COPD (chronic obstructive pulmonary disease) ([place])   . Coronary artery disease   . H/O: rheumatic fever   . Heart murmur   . Hypertension   . PONV (postoperative nausea and vomiting)    ' SOMETIMES', BUT NOT ALWAYS"  . UTI (urinary tract infection)   . Valvular heart disease    Has mitral stenosis with prior mitral commissurotomy in 1970   Past Surgical History:  Procedure Laterality Date  . ABDOMINAL HYSTERECTOMY  1983   endometriosis  . BACK SURGERY     neurosurgery  . CARDIAC CATHETERIZATION    . CARDIAC CATHETERIZATION N/A  08/03/2016   Procedure: Right/Left Heart Cath and Coronary Angiography;  Surgeon: Peter M Martinique, MD;  Location: [facility];  Service: Cardiovascular;  Laterality: N/A;  . cataract surg    . CHOLECYSTECTOMY N/A 05/30/2015   Procedure: LAPAROSCOPIC CHOLECYSTECTOMY WITH INTRAOPERATIVE CHOLANGIOGRAM;  Surgeon: Excell Seltzer, MD;  Location: [facility];  Service: General;  Laterality: N/A;  . EYE SURGERY    . MITRAL VALVE SURGERY Left 1970   Open mitral commissurotomy via left thoracotomy approach  . TEE WITHOUT CARDIOVERSION N/A 07/05/2016   Procedure: TRANSESOPHAGEAL ECHOCARDIOGRAM (TEE);  Surgeon: [name], MD;  Location: [facility];  Service: Cardiovascular;  Laterality: N/A;    reports that she quit smoking about 40 years ago. Her smoking use included Cigarettes. She has a 15.00 pack-year smoking history. She has never used smokeless tobacco. She reports that she does not drink alcohol or use drugs. family history includes Leukemia in her father. Allergies  Allergen Reactions  . Aldactone [Spironolactone] Other (See Comments)    dyspnea  . Amoxicillin Palpitations    Tachycardia Has patient had a PCN reaction causing immediate rash, facial/tongue/throat swelling, SOB or lightheadedness with hypotension: no Has patient had a PCN reaction causing severe rash involving mucus membranes or skin necrosis: {no Has patient had a PCN reaction that required hospitalization {no Has patient had a PCN reaction occurring within the last 10 years: {yes If all of the above answers are "NO", then may proceed with  Cephalosporin use.  . Diltiazem Other (See Comments)    Causing headaches   . Flagyl [Metronidazole Hcl] Other (See Comments)    Causing headaches    . Flovent [Fluticasone Propionate] Other (See Comments)    Leg cramps  . Gabapentin Swelling  . Lyrica [Pregabalin] Swelling  . Quinidine Diarrhea and Other (See Comments)    Fever diarrhea  . Simvastatin Other (See Comments)     Leg pain, myalgia  . Tramadol Nausea Only  . Verapamil Other (See Comments)    myalgias  . Ace Inhibitors Other (See Comments)    unknown  . Benazepril Hcl Cough  . Ciprocin-Fluocin-Procin [Fluocinolone Acetonide] Other (See Comments)    unknown  . Ciprofloxacin Diarrhea  . Codeine Nausea Only  . Nitrofurantoin Monohyd Macro Nausea Only     Review of Systems  Constitutional: Positive for fatigue. Negative for chills and fever.  Respiratory: Positive for shortness of breath. Negative for cough.   Cardiovascular: Negative for chest pain.  Gastrointestinal: Negative for abdominal pain.  Genitourinary: Negative for dysuria.  Psychiatric/Behavioral: Positive for sleep disturbance. Negative for agitation, behavioral problems, confusion and suicidal ideas. The patient is nervous/anxious.        Objective:   Physical Exam  Constitutional: She is oriented to person, place, and time. She appears well-developed and well-nourished.  Cardiovascular: Normal rate.   Pulmonary/Chest: Effort normal and breath sounds normal. No respiratory distress. She has no wheezes. She has no rales.  Musculoskeletal: She exhibits no edema.  Neurological: She is alert and oriented to person, place, and time. No cranial nerve deficit.  Skin:  1 cm relatively superficial ulcer right lower leg. She has good granulation tissue. Currently no evidence for surrounding cellulitis.  Psychiatric:  Patient slightly anxious but makes excellent eye contact and answers questions appropriately       Assessment:     #1 situational anxiety  #2 right leg wound with some recent cellulitis changes improving  #3 mitral valve stenosis with proposed surgery in January    Plan:     -We discussed management of her anxiety. Would not recommend initiating SSRI at this point as this would take at least 2 weeks to see any significant impact -We discussed nonpharmacologic management of anxiety symptoms -Short-term only may  take extra half tablet of diazepam 5 mg each morning and continue with 5 mg at night. Avoid any further escalation of dose  [name] MD [facility] at [facility]

## 2016-09-14 NOTE — Progress Notes (Signed)
Pre visit review using our clinic review tool, if applicable. No additional management support is needed unless otherwise documented below in the visit note. 

## 2016-09-16 ENCOUNTER — Encounter: Payer: Self-pay | Admitting: Vascular Surgery

## 2016-09-24 ENCOUNTER — Ambulatory Visit (HOSPITAL_COMMUNITY)
Admission: RE | Admit: 2016-09-24 | Discharge: 2016-09-24 | Disposition: A | Discharge status: Home / Self Care | Payer: Medicare Other | Source: Ambulatory Visit | Attending: Vascular Surgery | Admitting: Vascular Surgery

## 2016-09-24 ENCOUNTER — Ambulatory Visit (INDEPENDENT_AMBULATORY_CARE_PROVIDER_SITE_OTHER): Payer: Medicare Other | Admitting: Vascular Surgery

## 2016-09-24 ENCOUNTER — Encounter: Payer: Self-pay | Admitting: Vascular Surgery

## 2016-09-24 VITALS — BP 137/84 | HR 74 | Temp 97.8°F | Resp 16 | Ht 64.0 in | Wt 154.0 lb

## 2016-09-24 DIAGNOSIS — I739 Peripheral vascular disease, unspecified: Secondary | ICD-10-CM | POA: Diagnosis not present

## 2016-09-24 DIAGNOSIS — L03115 Cellulitis of right lower limb: Secondary | ICD-10-CM

## 2016-09-24 NOTE — Progress Notes (Signed)
Subjective:     Patient ID: Catherine [name], female   DOB: 12/28/1934, 81 y.o.   MRN: [id number]  HPI Catherine [name] returns today for evaluation of her right lower extremity wound. She also had ABIs performed in preparation for the visit. She is not having fevers or chills and thinks that the wound is healing on the right leg with local wound care and Band-Aid. She continues to walk without issues other than shortness of breath from her valve disease. There are no new issues related to today's visit.   Review of Systems Ulcer or right shin    Objective:   Physical Exam aaox3 Signals at right dp and pt that are not triphasic  no wounds of feet R shin with 1.0 cm healing wound, previously 1.5cm, no cellulitis    Assessment/Plan     81 year old female with wound on right shin from previous low-grade trauma. From my evaluation of her now 3 weeks ago the wound has healed from 1-1/2-1.0 cm does not have any surrounding erythema or cellulitis. I discussed with her that I think this wound will continue to heal although it may be slow. It is likely a mixed venous and arterial ulceration given that she apparently has had a healed venous ulcer on her medial leg although I do not have venous studies on her at this time. Her ABI is 0.84 on the right with a digital pressure of 106. She has varicosities in her leg to suggest venous disease although with upcoming cardiac surgery she may need her saphenous veins and so there would be no consideration of ablating those at this time. I think her wound will continue to heal and when Dr. Roxy Manns feels it is healed appropriately he can likely proceed with surgery. If for whatever reason it does not heal we can proceed with arteriogram to improve the arterial flow at that time I would also get venous reflux studies. If the wound heals and she proceeds with cardiac surgery I would like to see her back when she is completely healed from all of this. I will discuss her case with  Dr. Roxy Manns in the interim.  Paul Torpey C. Donzetta Matters, MD [facility] Office: [phone] Pager: [phone]

## 2016-10-04 ENCOUNTER — Encounter: Payer: Self-pay | Admitting: Thoracic Surgery (Cardiothoracic Vascular Surgery)

## 2016-10-04 ENCOUNTER — Ambulatory Visit (INDEPENDENT_AMBULATORY_CARE_PROVIDER_SITE_OTHER): Payer: Medicare Other | Admitting: General Practice

## 2016-10-04 ENCOUNTER — Ambulatory Visit (INDEPENDENT_AMBULATORY_CARE_PROVIDER_SITE_OTHER): Payer: Medicare Other | Admitting: Thoracic Surgery (Cardiothoracic Vascular Surgery)

## 2016-10-04 VITALS — BP 146/74 | HR 78 | Resp 16 | Ht 64.0 in | Wt 154.0 lb

## 2016-10-04 DIAGNOSIS — I340 Nonrheumatic mitral (valve) insufficiency: Secondary | ICD-10-CM | POA: Diagnosis not present

## 2016-10-04 DIAGNOSIS — I2510 Atherosclerotic heart disease of native coronary artery without angina pectoris: Secondary | ICD-10-CM | POA: Diagnosis not present

## 2016-10-04 DIAGNOSIS — L03115 Cellulitis of right lower limb: Secondary | ICD-10-CM

## 2016-10-04 DIAGNOSIS — I481 Persistent atrial fibrillation: Secondary | ICD-10-CM | POA: Diagnosis not present

## 2016-10-04 DIAGNOSIS — Z5181 Encounter for therapeutic drug level monitoring: Secondary | ICD-10-CM

## 2016-10-04 DIAGNOSIS — I050 Rheumatic mitral stenosis: Secondary | ICD-10-CM

## 2016-10-04 DIAGNOSIS — I4819 Other persistent atrial fibrillation: Secondary | ICD-10-CM

## 2016-10-04 DIAGNOSIS — I4891 Unspecified atrial fibrillation: Secondary | ICD-10-CM

## 2016-10-04 DIAGNOSIS — Z7901 Long term (current) use of anticoagulants: Secondary | ICD-10-CM

## 2016-10-04 LAB — POCT INR: INR: 2.6

## 2016-10-04 MED ORDER — ENOXAPARIN SODIUM 100 MG/ML ~~LOC~~ SOLN
100.0000 mg | SUBCUTANEOUS | 0 refills | Status: DC
Start: 2016-10-09 — End: 2016-10-22

## 2016-10-04 NOTE — Patient Instructions (Signed)
Pre visit review using our clinic review tool, if applicable. No additional management support is needed unless otherwise documented below in the visit note. 

## 2016-10-04 NOTE — Patient Instructions (Signed)
Stop taking Coumadin (warfarin) after you take your dose on Thursday 10/07/16  Begin Lovenox injections every morning on Saturday 10/09/16 and continue through Wednesday 10/13/16.  Do not take Lovenox on the morning of surgery  Continue taking all other medications without change through the day before surgery.  Have nothing to eat or drink after midnight the night before surgery.  On the morning of surgery take only Atenolol with a sip of water.

## 2016-10-04 NOTE — Progress Notes (Signed)
[address]       [place] [address]             [phone]     CARDIOTHORACIC SURGERY OFFICE NOTE  Referring Provider is [name], MD PCP is [name], MD   HPI:  Patient is a 82 year old female with multiple medical problems who returns to the office today for follow-up of rheumatic heart disease with stage D severe symptomatic mitral regurgitation and mitral stenosis, coronary artery disease, and long standing persistent atrial fibrillation.  She was originally seen in consultation on 08/04/2016 and we had previously plan to proceed with elective redo mitral valve replacement, coronary artery bypass grafting and Maze procedure on 08/26/2016.  However, just prior to that she stumbled and injured her right lower leg. She developed an ulceration on the anterior surface of the right lower leg with significant associated cellulitis. Surgery was postponed and the patient has been seen in consultation by Dr. Donzetta Matters at [facility] because of her underlying severe chronic venous insufficiency.  The patient returns to our office today for follow-up.  The ulceration on her right lower leg has been slowly healing and the cellulitis improved, but the wound has not completely healed. She continues to experience exertional shortness breath with very low level activity. She has trouble sleeping at night particularly when she tries to lay flat in bed. Her weight has been stable and she has not developed increased lower extremity edema. The remainder of her review of systems is unchanged from previously.    Current Outpatient Prescriptions  Medication Sig Dispense Refill  . atenolol (TENORMIN) 25 MG tablet Take 0.5 tablets (12.5 mg total) by mouth daily. 45 tablet 2  . B Complex-C (B-COMPLEX WITH VITAMIN C) tablet Take 1 tablet by mouth daily.    . budesonide-formoterol (SYMBICORT) 80-4.5 MCG/ACT inhaler Inhale 2 puffs into the lungs 2 (two) times daily. 1 Inhaler 11  .  diazepam (VALIUM) 5 MG tablet Take one half tablet each morning and one at night prn anxiety 45 tablet 3  . ezetimibe (ZETIA) 10 MG tablet Take 1 tablet (10 mg total) by mouth daily. 30 tablet 5  . furosemide (LASIX) 80 MG tablet Take 40 mg by mouth 2 (two) times daily.    . Multiple Vitamin (MULTIVITAMIN) tablet Take 1 tablet by mouth daily.      . potassium chloride (K-DUR) 10 MEQ tablet Take 20 mEq by mouth 5 (five) times daily.     Marland Kitchen PROAIR HFA 108 (90 Base) MCG/ACT inhaler 2 puffs every 6 hours as needed for wheezi ng or shortness of breath. 8.5 g 2  . triamterene-hydrochlorothiazide (MAXZIDE-25) 37.5-25 MG tablet Take 1 tablet by mouth daily. 90 tablet 2  . warfarin (COUMADIN) 2 MG tablet Take 2-3 mg by mouth one time only at 6 PM. Take 2 mgs daily on all days of the week except Mon and Fri take 3mg s daily    . acetaminophen (TYLENOL) 500 MG tablet Take 500 mg by mouth every 6 (six) hours as needed for mild pain.    Marland Kitchen enoxaparin (LOVENOX) 100 MG/ML injection Inject 1 mL (100 mg total) into the skin daily. On 11/4, 11/5, and 11/8 (Patient not taking: Reported on 10/04/2016) 5 Syringe 0   No current facility-administered medications for this visit.       Physical Exam:   BP (!) 146/74 (BP Location: Right Arm, Patient Position: Sitting, Cuff Size: Large)   Pulse 78   Resp  16   Ht 5\' 4"  (1.626 m)   Wt 154 lb (69.9 kg)   SpO2 94% Comment: ON RA  BMI 26.43 kg/m   General:  Elderly, mildly obese, but well-appearing  Chest:   Clear to auscultation with symmetrical breath sounds  CV:   Irregular rate and rhythm with blowing systolic murmur at apex  Incisions:  n/a  Abdomen:  Soft nontender  Extremities:  Warm and well-perfused. Clean ulceration anterior surface of the right lower leg that has decreased slightly in size. There is mild surrounding erythema but overall the cellulitis has essentially resolved.   Diagnostic Tests:  n/a   Impression:  Patient has long-standing rheumatic  heart disease with history of previous open mitral commissurotomy in 1970 and now presents with stage D severe symptomatic mitral regurgitation and mitral stenosis. She describes a long-standing history of symptoms consistent with chronic diastolic congestive heart failure that of progressed gradually over the last several years and are now quite limiting, consistent with New York Heart Association functional class III. The patient also has more than 30 years history of long-standing persistent atrial fibrillation on long-term anticoagulation using warfarin.  I have personally reviewed the patient's recent echocardiograms and diagnostic cardiac catheterization. Echocardiograms revealed classical rheumatic mitral valve disease with severe thickening and restricted leaflet mobility involving both leaflets of the mitral valve with severe thickening and foreshortening of the subvalvular apparatus. There is type IIIA mitral valve dysfunction with severe mitral regurgitation.   There is massive enlargement of both left and right atrium. There is only mild tricuspid regurgitation but the tricuspid annulus does appear somewhat dilated. Left ventricular systolic function is normal. There is no significant aortic valve disease. Diagnostic cardiac catheterization confirmed the presence of normal left ventricular systolic function and reveals only mild pulmonary hypertension, but also revealed multivessel coronary artery disease with tandem 80% stenosis in the right coronary artery.  I agree the patient needs mitral valve replacement and coronary artery bypass grafting. She might benefit from concomitant maze procedure in terms of improved rhythm control and decreased risk of thromboembolism, although the likelihood of restoration of sinus rhythm is probably very low because of the long duration of atrial fibrillation. There is a significant likelihood that the patient might require permanent pacemaker placement because of  symptomatic bradycardia if maze procedure is performed. Risks associated with surgery will be somewhat elevated because of the patient's advanced age, previous cardiac surgery, long-standing rheumatic disease, and other comorbid medical problems.  At this point the cellulitis involving the right lower leg has essentially resolved and I feel there is very little associated risk of infection. It may take several months for the ulceration to heal completely and I do not feel that there is any reason to postpone cardiac surgery any longer.  Plan:  We plan to proceed with redo median sternotomy for mitral valve replacement using a bioprosthetic tissue valve, Maze procedure, and coronary artery bypass grafting on 10/14/2016. I've again reviewed the indications, risks, and potential benefits of surgery with the patient and her husband.  The patient understands and accepts all potential risks of surgery including but not limited to risk of death, stroke or other neurologic complication, myocardial infarction, congestive heart failure, respiratory failure, renal failure, bleeding requiring transfusion and/or reexploration, arrhythmia, infection or other wound complications, pneumonia, pleural and/or pericardial effusion, pulmonary embolus, aortic dissection or other major vascular complication, recurrence of symptomatic ischemic heart disease, or delayed complications related to valve repair or replacement including but not limited to  structural valve deterioration and failure, thrombosis, embolization, endocarditis, or paravalvular leak.  The relative risks and benefits of performing a maze procedure at the time of their surgery was discussed at length, including the expected likelihood of long term freedom from recurrent symptomatic atrial fibrillation and/or atrial flutter as well as the increased risk of need for permanent pacemaker placement.  All of their questions have been answered.  The patient has been  instructed to stop taking Coumadin after she takes her dose the Thursday. She will begin Lovenox injections for bridging therapy on Saturday. On the morning of surgery she will take only atenolol with a sip of water. She may also take Valium and use her inhaler if she feels she needs either.     I spent in excess of 15 minutes during the conduct of this office consultation and >50% of this time involved direct face-to-face encounter with the patient for counseling and/or coordination of their care.    Valentina Gu. Roxy Manns, MD 10/04/2016 5:25 PM

## 2016-10-12 ENCOUNTER — Ambulatory Visit (HOSPITAL_BASED_OUTPATIENT_CLINIC_OR_DEPARTMENT_OTHER)
Admission: RE | Admit: 2016-10-12 | Discharge: 2016-10-12 | Disposition: A | Discharge status: Home / Self Care | Payer: Medicare Other | Source: Ambulatory Visit | Attending: Thoracic Surgery (Cardiothoracic Vascular Surgery) | Admitting: Thoracic Surgery (Cardiothoracic Vascular Surgery)

## 2016-10-12 ENCOUNTER — Ambulatory Visit (HOSPITAL_COMMUNITY)
Admission: RE | Admit: 2016-10-12 | Discharge: 2016-10-12 | Disposition: A | Discharge status: Home / Self Care | Payer: Medicare Other | Source: Ambulatory Visit | Attending: Thoracic Surgery (Cardiothoracic Vascular Surgery) | Admitting: Thoracic Surgery (Cardiothoracic Vascular Surgery)

## 2016-10-12 ENCOUNTER — Encounter (HOSPITAL_COMMUNITY): Payer: Self-pay

## 2016-10-12 ENCOUNTER — Encounter (HOSPITAL_COMMUNITY)
Admission: RE | Admit: 2016-10-12 | Discharge: 2016-10-12 | Disposition: A | Discharge status: Home / Self Care | Payer: Medicare Other | Source: Ambulatory Visit | Attending: Thoracic Surgery (Cardiothoracic Vascular Surgery) | Admitting: Thoracic Surgery (Cardiothoracic Vascular Surgery)

## 2016-10-12 DIAGNOSIS — I2510 Atherosclerotic heart disease of native coronary artery without angina pectoris: Secondary | ICD-10-CM

## 2016-10-12 DIAGNOSIS — I700 Atherosclerosis of aorta: Secondary | ICD-10-CM

## 2016-10-12 DIAGNOSIS — I340 Nonrheumatic mitral (valve) insufficiency: Secondary | ICD-10-CM

## 2016-10-12 DIAGNOSIS — Z79899 Other long term (current) drug therapy: Secondary | ICD-10-CM | POA: Diagnosis not present

## 2016-10-12 DIAGNOSIS — I6523 Occlusion and stenosis of bilateral carotid arteries: Secondary | ICD-10-CM | POA: Insufficient documentation

## 2016-10-12 DIAGNOSIS — I050 Rheumatic mitral stenosis: Secondary | ICD-10-CM | POA: Diagnosis not present

## 2016-10-12 DIAGNOSIS — Z888 Allergy status to other drugs, medicaments and biological substances status: Secondary | ICD-10-CM | POA: Diagnosis not present

## 2016-10-12 DIAGNOSIS — I081 Rheumatic disorders of both mitral and tricuspid valves: Secondary | ICD-10-CM | POA: Diagnosis not present

## 2016-10-12 DIAGNOSIS — Z01812 Encounter for preprocedural laboratory examination: Secondary | ICD-10-CM | POA: Insufficient documentation

## 2016-10-12 DIAGNOSIS — I4891 Unspecified atrial fibrillation: Secondary | ICD-10-CM

## 2016-10-12 DIAGNOSIS — I5033 Acute on chronic diastolic (congestive) heart failure: Secondary | ICD-10-CM | POA: Diagnosis not present

## 2016-10-12 DIAGNOSIS — I130 Hypertensive heart and chronic kidney disease with heart failure and stage 1 through stage 4 chronic kidney disease, or unspecified chronic kidney disease: Secondary | ICD-10-CM | POA: Diagnosis not present

## 2016-10-12 DIAGNOSIS — I482 Chronic atrial fibrillation: Secondary | ICD-10-CM | POA: Diagnosis not present

## 2016-10-12 DIAGNOSIS — I517 Cardiomegaly: Secondary | ICD-10-CM | POA: Diagnosis not present

## 2016-10-12 DIAGNOSIS — Z881 Allergy status to other antibiotic agents status: Secondary | ICD-10-CM | POA: Diagnosis not present

## 2016-10-12 DIAGNOSIS — I481 Persistent atrial fibrillation: Secondary | ICD-10-CM | POA: Diagnosis not present

## 2016-10-12 DIAGNOSIS — J449 Chronic obstructive pulmonary disease, unspecified: Secondary | ICD-10-CM | POA: Diagnosis not present

## 2016-10-12 DIAGNOSIS — I272 Pulmonary hypertension, unspecified: Secondary | ICD-10-CM | POA: Diagnosis not present

## 2016-10-12 DIAGNOSIS — N390 Urinary tract infection, site not specified: Secondary | ICD-10-CM | POA: Diagnosis not present

## 2016-10-12 DIAGNOSIS — M1990 Unspecified osteoarthritis, unspecified site: Secondary | ICD-10-CM | POA: Diagnosis not present

## 2016-10-12 DIAGNOSIS — Z01810 Encounter for preprocedural cardiovascular examination: Secondary | ICD-10-CM

## 2016-10-12 DIAGNOSIS — D62 Acute posthemorrhagic anemia: Secondary | ICD-10-CM | POA: Diagnosis not present

## 2016-10-12 DIAGNOSIS — I872 Venous insufficiency (chronic) (peripheral): Secondary | ICD-10-CM | POA: Diagnosis not present

## 2016-10-12 DIAGNOSIS — Z7901 Long term (current) use of anticoagulants: Secondary | ICD-10-CM | POA: Diagnosis not present

## 2016-10-12 DIAGNOSIS — D6959 Other secondary thrombocytopenia: Secondary | ICD-10-CM | POA: Diagnosis not present

## 2016-10-12 DIAGNOSIS — J9811 Atelectasis: Secondary | ICD-10-CM | POA: Diagnosis not present

## 2016-10-12 DIAGNOSIS — Z87891 Personal history of nicotine dependence: Secondary | ICD-10-CM | POA: Diagnosis not present

## 2016-10-12 DIAGNOSIS — L03115 Cellulitis of right lower limb: Secondary | ICD-10-CM | POA: Diagnosis not present

## 2016-10-12 DIAGNOSIS — N189 Chronic kidney disease, unspecified: Secondary | ICD-10-CM | POA: Diagnosis not present

## 2016-10-12 DIAGNOSIS — Z885 Allergy status to narcotic agent status: Secondary | ICD-10-CM | POA: Diagnosis not present

## 2016-10-12 DIAGNOSIS — Z880 Allergy status to penicillin: Secondary | ICD-10-CM | POA: Diagnosis not present

## 2016-10-12 HISTORY — DX: Dizziness and giddiness: R42

## 2016-10-12 LAB — SURGICAL PCR SCREEN
MRSA, PCR: NEGATIVE
Staphylococcus aureus: NEGATIVE

## 2016-10-12 LAB — PULMONARY FUNCTION TEST
DL/VA % PRED: 75 %
DL/VA: 3.51 ml/min/mmHg/L
DLCO unc % pred: 54 %
DLCO unc: 12.51 ml/min/mmHg
FEF 25-75 POST: 0.93 L/s
FEF 25-75 Pre: 0.77 L/sec
FEF2575-%Change-Post: 20 %
FEF2575-%PRED-POST: 66 %
FEF2575-%Pred-Pre: 55 %
FEV1-%CHANGE-POST: 5 %
FEV1-%PRED-PRE: 67 %
FEV1-%Pred-Post: 71 %
FEV1-PRE: 1.27 L
FEV1-Post: 1.34 L
FEV1FVC-%Change-Post: 0 %
FEV1FVC-%Pred-Pre: 93 %
FEV6-%Change-Post: 6 %
FEV6-%PRED-PRE: 76 %
FEV6-%Pred-Post: 80 %
FEV6-PRE: 1.81 L
FEV6-Post: 1.92 L
FEV6FVC-%Change-Post: 1 %
FEV6FVC-%PRED-POST: 106 %
FEV6FVC-%PRED-PRE: 104 %
FVC-%Change-Post: 4 %
FVC-%PRED-POST: 76 %
FVC-%PRED-PRE: 73 %
FVC-POST: 1.93 L
FVC-PRE: 1.84 L
PRE FEV6/FVC RATIO: 98 %
Post FEV1/FVC ratio: 70 %
Post FEV6/FVC ratio: 100 %
Pre FEV1/FVC ratio: 69 %
RV % PRED: 124 %
RV: 2.88 L
TLC % PRED: 101 %
TLC: 5 L

## 2016-10-12 LAB — PROTIME-INR
INR: 1.39
Prothrombin Time: 17.1 seconds — ABNORMAL HIGH (ref 11.4–15.2)

## 2016-10-12 LAB — COMPREHENSIVE METABOLIC PANEL
ALT: 28 U/L (ref 14–54)
AST: 34 U/L (ref 15–41)
Albumin: 4.6 g/dL (ref 3.5–5.0)
Alkaline Phosphatase: 65 U/L (ref 38–126)
Anion gap: 13 (ref 5–15)
BUN: 12 mg/dL (ref 6–20)
CO2: 27 mmol/L (ref 22–32)
Calcium: 9.8 mg/dL (ref 8.9–10.3)
Chloride: 102 mmol/L (ref 101–111)
Creatinine, Ser: 0.78 mg/dL (ref 0.44–1.00)
GFR calc Af Amer: >60 mL/min (ref >60)
GFR calc non Af Amer: >60 mL/min (ref >60)
Glucose, Bld: 92 mg/dL (ref 65–99)
Potassium: 3.4 mmol/L — ABNORMAL LOW (ref 3.5–5.1)
Sodium: 142 mmol/L (ref 135–145)
Total Bilirubin: 0.9 mg/dL (ref 0.3–1.2)
Total Protein: 7.3 g/dL (ref 6.5–8.1)

## 2016-10-12 LAB — BLOOD GAS, ARTERIAL
Acid-Base Excess: 4.4 mmol/L — ABNORMAL HIGH (ref 0.0–2.0)
Bicarbonate: 28.3 mmol/L — ABNORMAL HIGH (ref 20.0–28.0)
Drawn by: 211791
FIO2: 0.21
O2 Saturation: 95.2 %
Patient temperature: 98.6
pCO2 arterial: 42 mmHg (ref 32.0–48.0)
pH, Arterial: 7.444 (ref 7.350–7.450)
pO2, Arterial: 71.2 mmHg — ABNORMAL LOW (ref 83.0–108.0)

## 2016-10-12 LAB — URINALYSIS, ROUTINE W REFLEX MICROSCOPIC
BILIRUBIN URINE: NEGATIVE
GLUCOSE, UA: NEGATIVE mg/dL
HGB URINE DIPSTICK: NEGATIVE
KETONES UR: NEGATIVE mg/dL
LEUKOCYTES UA: NEGATIVE
Nitrite: NEGATIVE
PROTEIN: NEGATIVE mg/dL
Specific Gravity, Urine: 1.01 (ref 1.005–1.030)
pH: 7.5 (ref 5.0–8.0)

## 2016-10-12 LAB — VAS US DOPPLER PRE CABG
LEFT ECA DIAS: -6 cm/s
LEFT VERTEBRAL DIAS: -9 cm/s
Left CCA dist dias: 23 cm/s
Left CCA dist sys: 87 cm/s
Left CCA prox dias: 16 cm/s
Left CCA prox sys: 79 cm/s
Left ICA dist dias: -31 cm/s
Left ICA dist sys: -89 cm/s
Left ICA prox dias: 20 cm/s
Left ICA prox sys: 60 cm/s
RIGHT ECA DIAS: -21 cm/s
RIGHT VERTEBRAL DIAS: -14 cm/s
Right CCA prox dias: 20 cm/s
Right CCA prox sys: 70 cm/s
Right cca dist sys: -71 cm/s

## 2016-10-12 LAB — CBC
HCT: 41.5 % (ref 36.0–46.0)
Hemoglobin: 14.1 g/dL (ref 12.0–15.0)
MCH: 30.9 pg (ref 26.0–34.0)
MCHC: 34 g/dL (ref 30.0–36.0)
MCV: 91 fL (ref 78.0–100.0)
PLATELETS: 235 10*3/uL (ref 150–400)
RBC: 4.56 MIL/uL (ref 3.87–5.11)
RDW: 13.9 % (ref 11.5–15.5)
WBC: 7.2 10*3/uL (ref 4.0–10.5)

## 2016-10-12 LAB — APTT: aPTT: 45 seconds — ABNORMAL HIGH (ref 24–36)

## 2016-10-12 MED ORDER — ALBUTEROL SULFATE (2.5 MG/3ML) 0.083% IN NEBU
2.5000 mg | INHALATION_SOLUTION | Freq: Once | RESPIRATORY_TRACT | Status: AC
  Administered 2016-10-12: 2.5 mg via RESPIRATORY_TRACT

## 2016-10-12 NOTE — Progress Notes (Signed)
Pre-op Cardiac Surgery  Carotid Findings:   Findings are consistent with a 1-39 percent stenosis involving the right internal carotid artery and the left internal carotid artery. The vertebral arteries demonstrate antegrade flow.  Upper Extremity Right Left  Brachial Pressures 125  Biphasic 122  Triphasic  Radial Waveforms Biphasic Triphasic  Ulnar Waveforms Biphasic Triphasic  Palmar Arch (Allen's Test) Palmar waveforms are obliterated with radial and ulnar compression. Palmar waveforms are obliterated with radial compression and remain within normal limits with ulnar compression.

## 2016-10-12 NOTE — Progress Notes (Addendum)
PCP - [name] Cardiologist - [place]  Chest x-ray - 10/12/16 EKG - 10/12/16 Stress Test -  ECHO - 07/27/16 Cardiac Cath - 08/03/16 Sleep Study - denies  Will send to anesthesia for review of cardiac history  Last dose of 1/11 of coumadin lovenox started on 1/13 Last dose of lovenox 1/17  Patient denies shortness of breath, fever, cough and chest pain at PAT appointment  Right lower leg wound Dr Roxy Manns is aware and is ok with it at this time   Patient verbalized understanding of instructions that was given to them at the PAT appointment. Patient expressed that there were no further questions.  Patient was also instructed that they will need to review over the PAT instructions again at home before the surgery.

## 2016-10-12 NOTE — Progress Notes (Signed)
Called Ryan to notify her of abnormal labs left message

## 2016-10-12 NOTE — Pre-Procedure Instructions (Signed)
Catherine [name]  10/12/2016      [facility] - [place], [place] - [address] [address] Phone: [phone] Fax: [phone]  [facility] [place], [place] - [name] [place] [address] [name] [name] [place] [address] Phone: [phone] Fax: [phone]    Your procedure is scheduled on January 18  Report to [facility] at [place].M.  Call this number if you have problems the morning of surgery:  [phone]   Remember:  Do not eat food or drink liquids after midnight.   Take these medicines the morning of surgery with A SIP OF WATER acetaminophen (TYLENOL), albuterol (PROVENTIL HFA;VENTOLIN HFA), atenolol (TENORMIN), budesonide-formoterol (SYMBICORT),  diazepam (VALIUM) if needed, PROAIR HFA 108,   Bring inhalers with you day of surger  7 days prior to surgery STOP taking any Aspirin, Aleve, Naproxen, Ibuprofen, Motrin, Advil, Goody's, BC's, all herbal medications, fish oil, and all vitamins     Do not wear jewelry, make-up or nail polish.  Do not wear lotions, powders, or perfumes, or deoderant.  Do not shave 48 hours prior to surgery.    Do not bring valuables to the hospital.  [facility] is not responsible for any belongings or valuables.  Contacts, dentures or bridgework may not be worn into surgery.  Leave your suitcase in the car.  After surgery it may be brought to your room.  For patients admitted to the hospital, discharge time will be determined by your treatment team.  Patients discharged the day of surgery will not be allowed to drive home.    Special instructions:   [facility]- Preparing For Surgery  Before surgery, you can play an important role. Because skin is not sterile, your skin needs to be as free of germs as possible. You can reduce the number of germs on your skin by washing with CHG (chlorahexidine gluconate) Soap before  surgery.  CHG is an antiseptic cleaner which kills germs and bonds with the skin to continue killing germs even after washing.  Please do not use if you have an allergy to CHG or antibacterial soaps. If your skin becomes reddened/irritated stop using the CHG.  Do not shave (including legs and underarms) for at least 48 hours prior to first CHG shower. It is OK to shave your face.  Please follow these instructions carefully.   1. Shower the NIGHT BEFORE SURGERY and the MORNING OF SURGERY with CHG.   2. If you chose to wash your hair, wash your hair first as usual with your normal shampoo.  3. After you shampoo, rinse your hair and body thoroughly to remove the shampoo.  4. Use CHG as you would any other liquid soap. You can apply CHG directly to the skin and wash gently with a scrungie or a clean washcloth.   5. Apply the CHG Soap to your body ONLY FROM THE NECK DOWN.  Do not use on open wounds or open sores. Avoid contact with your eyes, ears, mouth and genitals (private parts). Wash genitals (private parts) with your normal soap.  6. Wash thoroughly, paying special attention to the area where your surgery will be performed.  7. Thoroughly rinse your body with warm water from the neck down.  8. DO NOT shower/wash with your normal soap after using and rinsing off the CHG Soap.  9. Pat yourself dry with a  CLEAN TOWEL.   10. Wear CLEAN PAJAMAS   11. Place CLEAN SHEETS on your bed the night of your first shower and DO NOT SLEEP WITH PETS.    Day of Surgery: Do not apply any deodorants/lotions. Please wear clean clothes to the hospital/surgery center.      Please read over the following fact sheets that you were given.

## 2016-10-13 LAB — HEMOGLOBIN A1C
HEMOGLOBIN A1C: 5.3 % (ref 4.8–5.6)
MEAN PLASMA GLUCOSE: 105 mg/dL

## 2016-10-13 MED ORDER — PLASMA-LYTE 148 IV SOLN
INTRAVENOUS | Status: AC
  Administered 2016-10-14: 500 mL
  Filled 2016-10-13: qty 2.5

## 2016-10-13 MED ORDER — SODIUM CHLORIDE 0.9 % IV SOLN
30.0000 ug/min | INTRAVENOUS | Status: AC
  Administered 2016-10-14: 60 ug/min via INTRAVENOUS
  Filled 2016-10-13: qty 2

## 2016-10-13 MED ORDER — TRANEXAMIC ACID (OHS) BOLUS VIA INFUSION
15.0000 mg/kg | INTRAVENOUS | Status: AC
  Administered 2016-10-14: 1048.5 mg via INTRAVENOUS
  Filled 2016-10-13: qty 1049

## 2016-10-13 MED ORDER — SODIUM CHLORIDE 0.9 % IV SOLN
INTRAVENOUS | Status: AC
  Administered 2016-10-14: 1.2 [IU]/h via INTRAVENOUS
  Filled 2016-10-13: qty 2.5

## 2016-10-13 MED ORDER — SODIUM CHLORIDE 0.9 % IV SOLN
1.5000 mg/kg/h | INTRAVENOUS | Status: AC
  Administered 2016-10-14: 1.5 mg/kg/h via INTRAVENOUS
  Filled 2016-10-13: qty 25

## 2016-10-13 MED ORDER — POTASSIUM CHLORIDE 2 MEQ/ML IV SOLN
80.0000 meq | INTRAVENOUS | Status: DC
  Filled 2016-10-13: qty 40

## 2016-10-13 MED ORDER — TRANEXAMIC ACID (OHS) PUMP PRIME SOLUTION
2.0000 mg/kg | INTRAVENOUS | Status: DC
  Filled 2016-10-13: qty 1.4

## 2016-10-13 MED ORDER — MAGNESIUM SULFATE 50 % IJ SOLN
40.0000 meq | INTRAMUSCULAR | Status: DC
  Filled 2016-10-13: qty 10

## 2016-10-13 MED ORDER — SODIUM CHLORIDE 0.9 % IV SOLN
INTRAVENOUS | Status: DC
  Filled 2016-10-13: qty 30

## 2016-10-13 MED ORDER — DOPAMINE-DEXTROSE 3.2-5 MG/ML-% IV SOLN
0.0000 ug/kg/min | INTRAVENOUS | Status: DC
  Filled 2016-10-13: qty 250

## 2016-10-13 MED ORDER — DEXTROSE 5 % IV SOLN
0.0000 ug/min | INTRAVENOUS | Status: DC
  Filled 2016-10-13: qty 4

## 2016-10-13 MED ORDER — SODIUM CHLORIDE 0.9 % IV SOLN
INTRAVENOUS | Status: AC
  Administered 2016-10-14: 11:00:00
  Filled 2016-10-13: qty 1000

## 2016-10-13 MED ORDER — NITROGLYCERIN IN D5W 200-5 MCG/ML-% IV SOLN
2.0000 ug/min | INTRAVENOUS | Status: DC
  Filled 2016-10-13: qty 250

## 2016-10-13 MED ORDER — GLUTARALDEHYDE 0.625% SOAKING SOLUTION
TOPICAL | Status: DC | PRN
  Filled 2016-10-13: qty 50

## 2016-10-13 MED ORDER — VANCOMYCIN HCL 10 G IV SOLR
1250.0000 mg | INTRAVENOUS | Status: AC
  Administered 2016-10-14: 1250 mg via INTRAVENOUS
  Filled 2016-10-13 (×2): qty 1250

## 2016-10-13 MED ORDER — DEXMEDETOMIDINE HCL IN NACL 400 MCG/100ML IV SOLN
0.1000 ug/kg/h | INTRAVENOUS | Status: AC
  Administered 2016-10-14: 0.7 ug/kg/h via INTRAVENOUS
  Filled 2016-10-13: qty 100

## 2016-10-13 MED ORDER — LEVOFLOXACIN IN D5W 500 MG/100ML IV SOLN
500.0000 mg | INTRAVENOUS | Status: AC
  Administered 2016-10-14: 500 mg via INTRAVENOUS
  Filled 2016-10-13 (×2): qty 100

## 2016-10-13 NOTE — Progress Notes (Signed)
Anesthesia Chart Review: Patient is a 82 year old female scheduled for redo mitral valve replacement, CABG, Maze procedure on 10/14/2016 by Dr. Roxy Manns. Surgery was initially planned for 08/26/16, but she stumbled and injured her RLE and developed and ulcer with subsequent cellulitis. Procedure was rescheduled once her leg improved.  History includes former smoker (quit '77), rheumatic valvular disease s/p mitral commissurotomy via left thoracotomy for mitral stenosis '70's, recurrent severe mitral stenosis with moderate to severe MR 07/2016, chronic afib, chronic diastolic CHF, COPD Gold II, venous insufficiency, PAD with occluded right brachial artery (by 07/2016 cath), post-operative N/V, asthma, arthritis, anxiety, nephrolithiasis (left nephrolithiasis with forniceal rupture), back surgery, hysterectomy, cholecystectomy 05/30/15.    PCP is Dr. Elease Hashimoto. Pulmonologist is Dr. [name].   Cardiologist is Dr. [name]. Vascular surgeon is Dr. Donzetta Matters.   Meds include albuterol, atenolol, Symbicort, Valium, warfarin (last dose 10/07/16), Lovenox bridge (last dose 10/13/16), Zetia, Lasix, KCl, Maxzide-25.  BP (!) 130/57   Pulse 71   Temp 36.7 C (Oral)   Resp 18   Ht 5\' 4"  (1.626 m)   Wt 154 lb (69.9 kg)   SpO2 98%   BMI 26.43 kg/m   EKG 10/12/16: Afib at 82 bpm, ST depression, consider subendocardial injury, prolonged QT.   Cardiac cath 08/13/16: 1. Coronary artery disease   - 50% mid LCx   - 80% sequential lesions in the mid RCA 2. Normal LV function 3. Severe mitral stenosis. MV gradient of 13 mm Hg. MVA 0.99 cm squared with index 0.57. 4. Moderate to severe mitral insufficiency 5. Mild pulmonary HTN. 6. Normal LV EDP 7. No significant AV gradient 8. Occluded right brachial artery. Plan: surgical evaluation for MVR and CABG.  TEE 07/05/16: Study Conclusions - Left ventricle: Systolic function was normal. Wall motion was   normal; there were no regional wall motion  abnormalities. - Mitral valve: Thickening. Moderate of the anterior leaflet and   posterior leaflet, consistent with rheumatic disease. The   findings are consistent with moderate stenosis. There was   moderate regurgitation directed centrally. Mean gradient (D): 5   mm Hg. Effective regurgitant orifice (PISA): 0.25 cm^2.   Regurgitant volume (PISA): 48 ml. - Left atrium: The atrium was severely dilated. No evidence of   thrombus in the atrial cavity or appendage. No evidence of   thrombus in the atrial cavity or appendage. No evidence of   thrombus in the atrial cavity or appendage. - Right atrium: The atrium was severely dilated. No evidence of   thrombus in the atrial cavity or appendage. - Atrial septum: No defect or patent foramen ovale was identified   by color flow Doppler.  Carotid U/S 10/12/16: Summary: Findings are consistent with a 1-39 percent stenosis involving the right internal carotid artery and the left internal carotid artery. The vertebral arteries demonstrate antegrade flow.  CTA Chest/abd/pelvis 08/13/16: IMPRESSION: - Mild atherosclerotic disease in the aorta, iliac arteries and proximal femoral arteries. No significant stenosis or occlusive disease. Iliac arteries are tortuous. - Changes in the right groin compatible with recent catheterization procedure but no evidence for a pseudoaneurysm. - No acute abnormality in the chest, abdomen or pelvis. - Enlarged left atrium compatible mitral valve disease. - Few punctate nodules throughout the lungs are nonspecific. No follow-up needed if patient is low-risk (and has no known or suspected primary neoplasm). Non-contrast chest CT can be considered in 12 months if patient is high-risk. This recommendation follows the consensus statement: Guidelines for Management of Incidental Pulmonary Nodules  Detected on CT Images: From the Fleischner Society 2017; Radiology 2017; [phone].  PFTs 10/12/16: FVC 1.84 (73%),  FEV1 1.27 (67%), FEV1/FVC ratio 69% (93%), FEF25-75% 0.77 (55%), DLCO unc 12.51 (54%).  Preoperative labs noted. Cr 0.78, CBC WNL. PT 17.1, INR 1.39, PTT 45, glucose 92. A1c 5.3%. T&S done. UA WNL. Repeat PT/PTT on arrival.   If no acute changes then I would anticipate that she could proceed as planned.  [facility] Short Stay Center/Anesthesiology Phone [phone] 10/13/2016 10:44 AM

## 2016-10-13 NOTE — H&P (Signed)
[address]       ,[place] [address]             [phone]          CARDIOTHORACIC SURGERY HISTORY AND PHYSICAL EXAM  Referring Provider is [name], MD PCP is [name], MD      Chief Complaint  Patient presents with  . Mitral Regurgitation    Surgical eval, Cardaic Cath 08/03/16, Spirometry 07/29/16, Stress test 07/27/16, TEE 07/05/16, ECHO 06/07/16  . Mitral Stenosis  . Coronary Artery Disease    HPI:  Patient is a 82 year old female with history of rheumatic fever during childhood and long-standing history of rheumatic heart disease, status post open mitral commissurotomy via left thoracotomy approach in 1970, long-standing persistent atrial fibrillation, chronic diastolic congestive heart failure, hypertension, and COPD who has been referred for surgical consultation to discuss treatment options for management of severe mitral regurgitation and mitral stenosis. The patient doesn't recall being hospitalized for rheumatic fever during childhood but she was diagnosed with it and the noted to have a heart murmur during her teenage years. She developed severe congestive heart failure and underwent open mitral commissurotomy in 1970. Her recovery was slow and prolonged but she ultimately did remarkably well. Approximately 10 years later she developed atrial fibrillation. She underwent cardioversion at least 2 or 3 times but has remained in long-standing persistent atrial fibrillation for many years. She has been chronically anticoagulated using warfarin for at least 30 years. For a long time she was followed by [name], but since his retirement she has been followed by Dr. Oval Linsey. Over the past several years the patient has developed worsening symptoms of exertional shortness of breath and fatigue.  She has a remote history of tobacco use although she quit smoking in 1977. She has been followed by Dr. [name] for COPD.  She was seen in  follow-up recently by Dr. Oval Linsey and follow-up echocardiogram revealed normal left ventricular systolic function with ejection fraction estimated 65-70%. Peak and mean transvalvular gradients across the mitral valve were estimated 22 and 8 mmHg respectively, corresponding to valve area estimated 1.9 cm. However, there was moderate to severe mitral regurgitation.  She subsequently underwent TEE to further evaluate the severity of mitral regurgitation on 07/05/2016. This confirmed the presence of normal left ventricular systolic function. Mean transvalvular gradient across the mitral valve was estimated 5 mmHg. There was moderate to severe mitral regurgitation with ERO measured 0.25 cm corresponding to regurgitant volume estimated 48 mL using PISA.  There was severe left and right atrial enlargement with no evidence of thrombus in the left atrial appendage.  Due to equivocal findings, stress echocardiography was performed 07/27/2016. With stress the mean gradient across the mitral valve increased from 6-14 mmHg, and mitral regurgitation appeared severe. In addition, the patient had some mild ST segment changes on EKG during peak stress.  The patient subsequently underwent left and right heart catheterization 08/03/2016.  At catheterization the patient had normal left ventricular systolic function with severe mitral valve stenosis, moderate multivessel coronary artery disease with tandem 80% stenosis in the right coronary artery, 50% stenosis in the left circumflex coronary artery, and only mild pulmonary hypertension.  In addition, the right brachial artery appeared occluded. The patient was referred for surgical consultation.  She was originally seen in consultation on 08/04/2016 and we had previously plan to proceed with elective redo mitral valve replacement, coronary artery bypass grafting and Maze procedure on 08/26/2016.  However,  just prior to that she stumbled and injured her right lower leg. She  developed an ulceration on the anterior surface of the right lower leg with significant associated cellulitis. Surgery was postponed and the patient has been seen in consultation by Dr. Donzetta Matters at [facility] because of her underlying severe chronic venous insufficiency.  The patient returns to our office today for follow-up.  The ulceration on her right lower leg has been slowly healing and the cellulitis improved, but the wound has not completely healed. She continues to experience exertional shortness breath with very low level activity. She has trouble sleeping at night particularly when she tries to lay flat in bed. Her weight has been stable and she has not developed increased lower extremity edema. The remainder of her review of systems is unchanged from previously.    The patient is married and lives with her husband in [place].  She still works part-time as a Theme park manager. She has remained remarkably active throughout her adult life although she complains that she has had to slow down dramatically for the past 3 years or more because of worsening exertional shortness of breath and fatigue. She states that she has had some degree of exertional shortness of breath for many many years.  Symptoms have gradually progressed over the past several years and she now gets short of breath with very mild activity and occasionally at rest. Her symptoms significantly limited her daily physical activities. She cannot lay flat in bed when she sleeps. She sometimes wakes up in the morning feeling her heart racing and short of breath. She has chronic swelling of both lower legs. She denies any history of exertional chest pain or chest tightness. She also complains of chronic swelling in pressure-like sensation in the right upper quadrant.   Past Medical History:  Diagnosis Date  . Arthritis   . Asthma    last attack 02/2015  . Atrial fibrillation, chronic ([place])   . Cellulitis of left lower extremity 08/17/2016   Ulcer  associated with severe venous insufficiency  . Chronic anticoagulation   . Chronic diastolic CHF (congestive heart failure) ([place])   . Chronic kidney disease    "RIGHT MANY KIDNEY INFECTIONS AND STONES"  . COPD (chronic obstructive pulmonary disease) ([place])   . Coronary artery disease   . Dizziness   . H/O: rheumatic fever   . Heart murmur   . Hypertension   . PONV (postoperative nausea and vomiting)    ' SOMETIMES', BUT NOT ALWAYS"  . UTI (urinary tract infection)   . Valvular heart disease    Has mitral stenosis with prior mitral commissurotomy in 1970    Past Surgical History:  Procedure Laterality Date  . ABDOMINAL HYSTERECTOMY  1983   endometriosis  . APPENDECTOMY    . BACK SURGERY     neurosurgery x2  . CARDIAC CATHETERIZATION    . CARDIAC CATHETERIZATION N/A 08/03/2016   Procedure: Right/Left Heart Cath and Coronary Angiography;  Surgeon: Peter M Martinique, MD;  Location: [facility];  Service: Cardiovascular;  Laterality: N/A;  . cataract surg    . CHOLECYSTECTOMY N/A 05/30/2015   Procedure: LAPAROSCOPIC CHOLECYSTECTOMY WITH INTRAOPERATIVE CHOLANGIOGRAM;  Surgeon: Excell Seltzer, MD;  Location: [facility];  Service: General;  Laterality: N/A;  . COLONOSCOPY    . EYE SURGERY    . MITRAL VALVE SURGERY Left 1970   Open mitral commissurotomy via left thoracotomy approach  . TEE WITHOUT CARDIOVERSION N/A 07/05/2016   Procedure: TRANSESOPHAGEAL ECHOCARDIOGRAM (TEE);  Surgeon:  [name], MD;  Location: [facility];  Service: Cardiovascular;  Laterality: N/A;    Family History  Problem Relation Age of Onset  . Leukemia Father     Social History Social History  Substance Use Topics  . Smoking status: Former Smoker    Packs/day: 1.00    Years: 15.00    Types: Cigarettes    Quit date: 09/28/1975  . Smokeless tobacco: Never Used  . Alcohol use No    Prior to Admission medications   Medication Sig Start Date End Date Taking? Authorizing Provider  acetaminophen  (TYLENOL) 500 MG tablet Take 500 mg by mouth every 6 (six) hours as needed for mild pain.   Yes Historical Provider, MD  albuterol (PROVENTIL HFA;VENTOLIN HFA) 108 (90 Base) MCG/ACT inhaler Inhale into the lungs every 6 (six) hours as needed for wheezing or shortness of breath.   Yes Historical Provider, MD  atenolol (TENORMIN) 25 MG tablet Take 0.5 tablets (12.5 mg total) by mouth daily. 05/06/16  Yes [name], MD  B Complex-C (B-COMPLEX WITH VITAMIN C) tablet Take 1 tablet by mouth daily.   Yes Historical Provider, MD  budesonide-formoterol (SYMBICORT) 80-4.5 MCG/ACT inhaler Inhale 2 puffs into the lungs 2 (two) times daily. 02/03/16  Yes [name], MD  diazepam (VALIUM) 5 MG tablet Take one half tablet each morning and one at night prn anxiety Patient taking differently: Take 2.5-5 mg by mouth 2 (two) times daily. Take one half tablet each morning if needed for anxiety and 1 tablet at bedtime every night. 09/14/16  Yes [name], MD  ezetimibe (ZETIA) 10 MG tablet Take 10 mg by mouth daily.   Yes Historical Provider, MD  furosemide (LASIX) 80 MG tablet Take 40 mg by mouth 2 (two) times daily. Takes 0.5 tablet   Yes Historical Provider, MD  Multiple Vitamin (MULTIVITAMIN) tablet Take 1 tablet by mouth daily.     Yes Historical Provider, MD  potassium chloride (K-DUR) 10 MEQ tablet Take 100 mEq by mouth daily. Takes 10 tablets daily because unable to swallow the 20 mEq   Yes Historical Provider, MD  PROAIR HFA 108 (90 Base) MCG/ACT inhaler 2 puffs every 6 hours as needed for wheezi ng or shortness of breath. Patient taking differently: 2 puffs every 6 hours as needed for wheezing or shortness of breath. 04/28/16  Yes [name], MD  triamterene-hydrochlorothiazide (MAXZIDE-25) 37.5-25 MG tablet Take 1 tablet by mouth daily. 08/10/16  Yes [name], MD  warfarin (COUMADIN) 2 MG tablet Take 2-3 mg by mouth one time only at 6 PM. Take 1 tablet (2 mg) daily on all days of  the week except Mon and Fri take 1.5 tablet (3mg )   Yes Historical Provider, MD  enoxaparin (LOVENOX) 100 MG/ML injection Inject 1 mL (100 mg total) into the skin daily. On 11/4, 11/5, and 11/8 Patient taking differently: Inject 100 mg into the skin daily.  10/09/16 10/13/16  Rexene Alberts, MD  ezetimibe (ZETIA) 10 MG tablet Take 1 tablet (10 mg total) by mouth daily. Patient not taking: Reported on 10/06/2016 05/27/16 10/04/16  [name], MD    Allergies  Allergen Reactions  . Aldactone [Spironolactone] Other (See Comments)    dyspnea  . Amoxicillin Palpitations    Tachycardia Has patient had a PCN reaction causing immediate rash, facial/tongue/throat swelling, SOB or lightheadedness with hypotension: no Has patient had a PCN reaction causing severe rash involving mucus membranes or skin necrosis: {no Has patient had  a PCN reaction that required hospitalization {no Has patient had a PCN reaction occurring within the last 10 years: {yes If all of the above answers are "NO", then may proceed with Cephalosporin use.  . Diltiazem Other (See Comments)    Causing headaches   . Flagyl [Metronidazole Hcl] Other (See Comments)    Causing headaches    . Flovent [Fluticasone Propionate] Other (See Comments)    Leg cramps  . Gabapentin Swelling  . Lyrica [Pregabalin] Swelling  . Quinidine Diarrhea and Other (See Comments)    Fever diarrhea  . Simvastatin Other (See Comments)    Leg pain, myalgia  . Tramadol Nausea Only  . Verapamil Other (See Comments)    myalgias  . Ace Inhibitors Other (See Comments)    unknown  . Benazepril Hcl Cough  . Ciprocin-Fluocin-Procin [Fluocinolone Acetonide] Other (See Comments)    unknown  . Ciprofloxacin Diarrhea  . Codeine Nausea Only  . Nitrofurantoin Monohyd Macro Nausea Only    Review of Systems:              General:                      normal appetite, decreased energy, + weight gain, no weight loss, no fever             Cardiac:                        no chest pain with exertion, no chest pain at rest, + SOB with exertion, occasional resting SOB, no PND, + orthopnea, + palpitations, + arrhythmia, + atrial fibrillation, + LE edema, no dizzy spells, no syncope             Respiratory:                 + shortness of breath, no home oxygen, no productive cough, no dry cough, no bronchitis, no wheezing, no hemoptysis, no asthma, no pain with inspiration or cough, no sleep apnea, no CPAP at night             GI:                               no difficulty swallowing, no reflux, no frequent heartburn, no hiatal hernia, no abdominal pain, no constipation, no diarrhea, no hematochezia, no hematemesis, no melena             GU:                              no dysuria,  no frequency, no urinary tract infection, no hematuria, no kidney stones, + mild chronic kidney disease             Vascular:                     no pain suggestive of claudication, no pain in feet, occasional leg cramps, + varicose veins, no DVT, no non-healing foot ulcer             Neuro:                         no stroke, no TIA's, no seizures, no headaches, no temporary blindness one eye,  no slurred speech, no peripheral neuropathy, no chronic pain, no instability of  gait, no memory/cognitive dysfunction             Musculoskeletal:         mild arthritis, no joint swelling, no myalgias, no difficulty walking, normal mobility              Skin:                            no rash, no itching, no skin infections, no pressure sores or ulcerations             Psych:                         + anxiety, no depression, no nervousness, no unusual recent stress             Eyes:                           + blurry vision, no floaters, + recent vision changes, + wears glasses for reading             ENT:                            no hearing loss, no loose or painful teeth, no dentures, last saw dentist 07/2015             Hematologic:               + easy bruising, + abnormal bleeding,  no clotting disorder, no frequent epistaxis             Endocrine:                   no diabetes, does not check CBG's at home                           Physical Exam:              BP 122/82 (BP Location: Left Arm, Patient Position: Sitting, Cuff Size: Small)   Pulse 61   Resp 20   Ht 5\' 4"  (1.626 m)   Wt 148 lb (67.1 kg)   SpO2 99% Comment: RA  BMI 25.40 kg/m              General:                      Elderly but well-appearing             HEENT:                       Unremarkable              Neck:                           no JVD, no bruits, no adenopathy              Chest:                          clear to auscultation, symmetrical breath sounds, no wheezes, no rhonchi              CV:  Irregular rate and rhythm, + systolic murmur              Abdomen:                    soft, non-tender, no masses              Extremities:                 warm, well-perfused, pulses diminished, + bilateral LE edema             Rectal/GU                   Deferred             Neuro:                         Grossly non-focal and symmetrical throughout             Skin:                            Clean and dry, no rashes, no breakdown   Diagnostic Tests:  Transthoracic Echocardiography  Patient: Catherine [name], Bayes MR #: [id number] Study Date: 06/07/2016 Gender: F Age: 59 Height: 162.6 cm Weight: 67.7 kg BSA: 1.76 m^2 Pt. Status: Room:  ATTENDING Dorris Carnes, M.D. SONOGRAPHER Oletta Lamas, Will PERFORMING [facility], Outpatient ORDERING [name], MD [place], MD  cc:  ------------------------------------------------------------------- LV EF: 65% - 70%  ------------------------------------------------------------------- Indications: (I34.0).  ------------------------------------------------------------------- History: PMH: Rheumatic mitral disease. H/o  Mitral commissurotomy 1970s. Moderate MR. Acquired from the patient and from the patient&'s chart. Dyspnea. Atrial fibrillation. Chronic obstructive pulmonary disease. Risk factors: Hypertension.  ------------------------------------------------------------------- Study Conclusions  - Left ventricle: The cavity size was normal. Wall thickness was normal. Systolic function was vigorous. The estimated ejection fraction was in the range of 65% to 70%. - Mitral valve: MV is thickened. with some restricted motion Peak and mean gradients through the valve are 22 and 8 mm Hg respectively MVA by Pt1/2 is 1.9 cm2. Calcified annulus. Mildly thickened leaflets . There was moderate to severe regurgitation. - Left atrium: The atrium was massively dilated. - Right atrium: The atrium was mildly dilated.  ------------------------------------------------------------------- Study data: Comparison was made to the study of 03/12/2015. Study status: Routine. Procedure: The patient reported no pain pre or post test. Transthoracic echocardiography for left ventricular function evaluation and for assessment of valvular function. Image quality was adequate. Study completion: There were no complications. Transthoracic echocardiography. M-mode, complete 2D, spectral Doppler, and color Doppler. Birthdate: Patient birthdate: [birth year]. Age: Patient is 82 yr old. Sex: Gender: female. BMI: 25.6 kg/m^2. Blood pressure: 134/73 Patient status: Outpatient. Study date: Study date: 06/07/2016. Study time: 01:15 PM. Location: [facility]  -------------------------------------------------------------------  ------------------------------------------------------------------- Left ventricle: The cavity size was normal. Wall thickness was normal. Systolic function was vigorous. The estimated ejection fraction was in the range of 65% to  70%.  ------------------------------------------------------------------- Aortic valve: AV is thickened with minimally restricted motion. Doppler: There was no regurgitation.  ------------------------------------------------------------------- Mitral valve: MV is thickened. with some restricted motion Peak and mean gradients through the valve are 22 and 8 mm Hg respectively MVA by Pt1/2 is 1.9 cm2. Calcified annulus. Mildly thickened leaflets . Doppler: There was moderate to severe regurgitation. Valve area by pressure half-time: 1.85 cm^2. Indexed valve area by pressure half-time: 1.05 cm^2/m^2. Valve area by continuity equation (using  LVOT flow): 0.65 cm^2. Indexed valve area by continuity equation (using LVOT flow): 0.37 cm^2/m^2. Mean gradient (D): 7 mm Hg. Peak gradient (D): 15 mm Hg.  ------------------------------------------------------------------- Left atrium: The atrium was massively dilated.  ------------------------------------------------------------------- Right ventricle: The cavity size was normal. Wall thickness was normal. Systolic function was normal.  ------------------------------------------------------------------- Pulmonic valve: Structurally normal valve. Cusp separation was normal. Doppler: Transvalvular velocity was within the normal range. There was trivial regurgitation.  ------------------------------------------------------------------- Tricuspid valve: Structurally normal valve. Leaflet separation was normal. Doppler: Transvalvular velocity was within the normal range. There was mild regurgitation.  ------------------------------------------------------------------- Right atrium: The atrium was mildly dilated.  ------------------------------------------------------------------- Pericardium: There was no pericardial effusion.  ------------------------------------------------------------------- Systemic  veins: Inferior vena cava: The vessel was dilated. The respirophasic diameter changes were in the normal range (>= 50%).  ------------------------------------------------------------------- Measurements  Left ventricle Value Reference LV ID, ED, PLAX chordal (L) 41.7 mm 43 - 52 LV ID, ES, PLAX chordal 24.8 mm 23 - 38 LV fx shortening, PLAX chordal 41 % >=29 LV PW thickness, ED 10.5 mm --------- IVS/LV PW ratio, ED 1.09 <=1.3 Stroke volume, 2D 41 ml --------- Stroke volume/bsa, 2D 23 ml/m^2 ---------  Ventricular septum Value Reference IVS thickness, ED 11.4 mm ---------  LVOT Value Reference LVOT ID, S 15 mm --------- LVOT area 1.77 cm^2 --------- LVOT ID 15 mm --------- LVOT peak velocity, S 125 cm/s --------- LVOT mean velocity, S 68.4 cm/s --------- LVOT VTI, S 23 cm --------- LVOT peak gradient, S 6 mm Hg --------- Stroke volume (SV), LVOT DP 40.6 ml --------- Stroke index (SV/bsa), LVOT DP 23.1 ml/m^2 ---------  Aorta Value Reference Aortic root ID, ED 33 mm --------- Ascending aorta ID, A-P, S 35 mm ---------  Left atrium Value Reference LA ID, A-P, ES 59 mm --------- LA ID/bsa,  A-P (H) 3.35 cm/m^2 <=2.2 LA volume, S 135 ml --------- LA volume/bsa, S 76.6 ml/m^2 --------- LA volume, ES, 1-p A4C 129 ml --------- LA volume/bsa, ES, 1-p A4C 73.2 ml/m^2 --------- LA volume, ES, 1-p A2C 132 ml --------- LA volume/bsa, ES, 1-p A2C 74.9 ml/m^2 ---------  Mitral valve Value Reference Mitral E-wave peak velocity 193 cm/s --------- Mitral mean velocity, D 111 cm/s --------- Mitral deceleration time (H) 391 ms 150 - 230 Mitral pressure half-time 120 ms --------- Mitral mean gradient, D 7 mm Hg --------- Mitral peak gradient, D 15 mm Hg --------- Mitral valve area, PHT, DP 1.85 cm^2 --------- Mitral valve area/bsa, PHT, DP 1.05 cm^2/m^2 --------- Mitral valve area, LVOT 0.65 cm^2 --------- continuity Mitral valve area/bsa, LVOT 0.37 cm^2/m^2 --------- continuity Mitral annulus VTI, D 62.7 cm ---------  Pulmonary arteries Value Reference PA pressure, S, DP 30 mm Hg <=30  Tricuspid valve Value Reference Tricuspid regurg peak velocity 259 cm/s --------- Tricuspid peak RV-RA gradient 27 mm Hg ---------  Systemic veins Value Reference Estimated CVP 3 mm Hg ---------  Right ventricle Value Reference RV pressure, S, DP 30 mm Hg  <=30  Legend: (L) and (H) mark values outside specified reference range.  ------------------------------------------------------------------- Prepared and Electronically Authenticated by  Dorris Carnes, M.D. 2017-09-11T18:[id number]:39     Transesophageal Echocardiography  Patient: Savon, Giuffrida MR #: [id number] Study Date: 07/05/2016 Gender: F Age: 7 Height: 162.6 cm Weight: 67.6 kg BSA: 1.76 m^2 Pt. Status: Room:  SONOGRAPHER Darlina Sicilian, RDCS ADMITTING [name], MD ATTENDING [name], MD ORDERING [name], MD PERFORMING [name], MD [place], MD  cc:  -------------------------------------------------------------------  ------------------------------------------------------------------- Indications: MVD [non-rheumatic] 424.0.  ------------------------------------------------------------------- History:  PMH: Shortness of Breath and Fatigue. Chronic Kidney Disease. Murmur. Atrial fibrillation.  ------------------------------------------------------------------- Study Conclusions  - Left ventricle: Systolic function was normal. Wall motion was normal; there were no regional wall motion abnormalities. - Mitral valve: Thickening. Moderate of the anterior leaflet and posterior leaflet, consistent with rheumatic disease. The findings are consistent with moderate stenosis. There was moderate regurgitation directed centrally. Mean gradient (D): 5 mm Hg. Effective regurgitant orifice (PISA): 0.25 cm^2. Regurgitant volume (PISA): 48 ml. - Left atrium: The atrium was severely dilated. No evidence of thrombus in the atrial cavity or appendage. No evidence of thrombus in the atrial cavity or appendage. No evidence of thrombus in the atrial cavity or appendage. - Right atrium: The atrium was severely dilated. No evidence  of thrombus in the atrial cavity or appendage. - Atrial septum: No defect or patent foramen ovale was identified by color flow Doppler.  ------------------------------------------------------------------- Labs, prior tests, procedures, and surgery: Commissurotomy 1970.  ------------------------------------------------------------------- Study data: Study status: Routine. Consent: The risks, benefits, and alternatives to the procedure were explained to the patient and informed consent was obtained. Procedure: The patient reported no pain pre or post test. Initial setup. The patient was brought to the laboratory. Surface ECG leads were monitored. Sedation. Conscious sedation was administered by cardiology staff. Transesophageal echocardiography. A transesophageal probe was inserted by the attending cardiologistwithout difficulty. Image quality was adequate. Study completion: The patient tolerated the procedure well. There were no complications. Administered medications: Fentanyl, 24mcg. Midazolam, 6mg . Diphenhydramine, 25mg . Diagnostic transesophageal echocardiography. 2D and color Doppler. Birthdate: Patient birthdate: 11-20-1934. Age: Patient is 82 yr old. Sex: Gender: female. BMI: 25.6 kg/m^2. Blood pressure: 152/82 Patient status: Inpatient. Study date: Study date: 07/05/2016. Study time: 03:45 PM. Location: Endoscopy.  -------------------------------------------------------------------  ------------------------------------------------------------------- Left ventricle: Systolic function was normal. Wall motion was normal; there were no regional wall motion abnormalities.  ------------------------------------------------------------------- Aortic valve: Structurally normal valve. Trileaflet; normal thickness leaflets. Cusp separation was normal. Doppler: There was no significant regurgitation. Evidence of Lambl&'s  excrescences.  ------------------------------------------------------------------- Aorta: There was mild atheromatous plaque. There was no evidence for dissection. Aortic root: The aortic root was not dilated. Ascending aorta: The ascending aorta was normal in size. Aortic arch: The aortic arch was normal in size. Descending aorta: The descending aorta was normal in size.  ------------------------------------------------------------------- Mitral valve: Thickening. Moderate of the anterior leaflet and posterior leaflet, consistent with rheumatic disease. Leaflet separation was normal. Doppler: The findings are consistent with moderate stenosis. There was moderate regurgitation directed centrally. Mean gradient (D): 5 mm Hg.  ------------------------------------------------------------------- Left atrium: The atrium was severely dilated. No evidence of thrombus in the atrial cavity or appendage. No evidence of thrombus in the atrial cavity or appendage. No evidence of thrombus in the atrial cavity or appendage. The appendage was morphologically a left appendage, multilobulated, and of normal size. Emptying velocity was normal.  ------------------------------------------------------------------- Atrial septum: No defect or patent foramen ovale was identified by color flow Doppler.  ------------------------------------------------------------------- Pulmonary veins: Well visualized. No systolic flow reversal.  ------------------------------------------------------------------- Right ventricle: The cavity size was normal. Wall thickness was normal. Systolic function was normal.  ------------------------------------------------------------------- Pulmonic valve: Structurally normal valve.  ------------------------------------------------------------------- Tricuspid valve: Structurally normal valve. Leaflet separation was normal. Doppler: There  was trivial regurgitation.  ------------------------------------------------------------------- Pulmonary artery: The main pulmonary artery was normal-sized.  ------------------------------------------------------------------- Right atrium: The atrium was severely dilated. No evidence of thrombus in the atrial cavity or appendage. The appendage was morphologically a right appendage.  ------------------------------------------------------------------- Pericardium: There was no pericardial effusion.  -------------------------------------------------------------------  Measurements  Mitral valve Value Mitral mean velocity, D 94.8 cm/s Mitral mean gradient, D 5 mm Hg Mitral annulus VTI, D 51.5 cm Mitral regurg VTI, PISA 192 cm Mitral ERO, PISA 0.25 cm^2 Mitral regurg volume, PISA 48 ml  Tricuspid valve Value Tricuspid regurg peak velocity 216.62 cm/s Tricuspid peak RV-RA gradient 19 mm Hg Tricuspid maximal regurg velocity, PISA 216.62 cm/s  Legend: (L) and (H) mark values outside specified reference range.  ------------------------------------------------------------------- Prepared and Electronically Authenticated by  [name], MD 2017-10-09T18:47:01    Stress Echocardiography  Patient: Estefania, Gerecke MR #: [id number] Study Date: 07/27/2016 Gender: F Age: 51 Height: 162.6 cm Weight: 65.5 kg BSA: 1.73 m^2 Pt. Status: Room:  SONOGRAPHER Victorio Palm, RDCS PERFORMING [facility], Outpatient ATTENDING [name], MD ORDERING [name], MD [place],  MD  cc:  -------------------------------------------------------------------  ------------------------------------------------------------------- Indications: (R06.02). (I34.0). Assess Mitral valve (stenosis and regurgitation)  ------------------------------------------------------------------- History: PMH: Acquired from the patient and from the patient&'s chart. Dyspnea and murmur. Persistent atrial fibrillation. Moderate mitral regurgitation. Borderline significant mitral stenosis. Chronic obstructive pulmonary disease. Rheumatic fever. Risk factors: Hypertension. Dyslipidemia.  ------------------------------------------------------------------- Study Conclusions  - Mitral valve: Mitral stenosis at rest - mean gradient 62mmHg (mild to moderate) Mitral stenosis at stress (exericse) - mean gradient 69mmHg. (Severe)  Mitral regugitation appears severe at both rest and stress. (PISA radius 0.8cm at rest, 1.2cm at stress). Severely dilated left atrium. - Stress ECG conclusions: Atrial fibrillation at baseline and stress. There was 2 mm hortizontal/ downsloping ST depression at stress in several leads with elevation in aVR. - Staged echo: EF 65% at baseline and 75% with exercise. Study not able to determine echocardiographic evidence of ischemia due to lack of standard protocol.  Impressions:  - Abnormal ETT portion of study - poor exercise tolerance (42min), ST depression 2 mm diffusely with aVR elevation concerning for global ischemia. EF does improve during exercise. Study not technically sufficient to assess wall motion abnormalities in all segments.  Severe mitral regurgitation with severe mitral stenosis at stress. See above for details.  ------------------------------------------------------------------- Labs, prior tests, procedures, and surgery: Transesophageal echocardiography (07/05/2016). The  mitral valve showed moderate stenosis and moderate regurgitation. Mitral valve: mean gradient of 5 mm Hg.  Catheterization with cardiac intervention (1970). Mitral commissurotomy was performed.  ------------------------------------------------------------------- Study data: Images are not labeled pre and post, sorry. Images 1-29 are pre exercise pictures. Images 30 to the end are post exercise pictures. Study status: Routine. Consent: The risks, benefits, and alternatives to the procedure were explained to the patient and informed consent was obtained. Procedure: The patient reported no pain pre or post test. Initial setup. The patient was brought to the laboratory. A baseline ECG was recorded. Surface ECG leads and automatic cuff blood pressure measurements were monitored. Treadmill exercise testing was performed using the Bruce protocol. The patient exercised for 3 min, to protocol stage 1, to a maximal work rate of 4.6 mets. Exercise was terminated due to fatigue. The patient was positioned for image acquisition and recovery monitoring. Transthoracic stress echocardiography for assessment of valvular function and Unable to do ischemia assess due to atrial fibrillation. Image quality was adequate. Images were captured at baseline and peak exercise. Study completion: The patient tolerated the procedure well. There were no complications. Bruce protocol. Stress echocardiography. Birthdate: Patient birthdate: [birth year]. Age: Patient is 82 yr old. Sex: Gender: female. BMI: 24.8 kg/m^2. Blood pressure: 142/77 Patient status: Outpatient. Study date: Study date: 07/27/2016. Study time: 07:38 AM.  -------------------------------------------------------------------  -------------------------------------------------------------------  Mitral valve: Mitral stenosis at rest - mean gradient 49mmHg (mild to moderate) Mitral stenosis at stress (exericse) -  mean gradient 54mmHg. (Severe)  Mitral regugitation appears severe at both rest and stress. (PISA radius 0.8cm at rest, 1.2cm at stress). Severely dilated left atrium. Pre exercise mitral valve measurements Mitral mean velocity, D Mitral mean gradient, D 6 mm Hg Mitral annulus VTI, D 49 cm Mitral regurg, VTI, PISA 195 cm Mitral ERO, PISA 0.28 cm2 Mitral regurg volume, PISA 55 ml Doppler: Mean gradient (D): 14 mm Hg.  ------------------------------------------------------------------- Stress protocol:  +---------------------+---+-----------+---------------+ !Stage !HR !BP (mmHg) !Symptoms ! +---------------------+---+-----------+---------------+ !Baseline !59 !142/77 (99)!None ! +---------------------+---+-----------+---------------+ !Stage 1 !117!52/33 (39) !Fatigue, RPE 17! +---------------------+---+-----------+---------------+ !Immediate post stress!108!-----------!None ! +---------------------+---+-----------+---------------+ !Recovery; 1 min !96 !92/46 (61) !None ! +---------------------+---+-----------+---------------+ !Recovery; 2 min !71 !-----------!None ! +---------------------+---+-----------+---------------+ !Recovery; 3 min !68 !-----------!None ! +---------------------+---+-----------+---------------+ !Recovery; 4 min !69 !126/73 (91)!None ! +---------------------+---+-----------+---------------+ !Recovery; 5 min !64 !-----------!None ! +---------------------+---+-----------+---------------+ !Late recovery !65 !117/69 (85)!None ! +---------------------+---+-----------+---------------+  ------------------------------------------------------------------- Stress results: Maximal heart rate during stress was 117 bpm (83% of maximal predicted heart rate). The maximal predicted heart rate was 141  bpm.The target heart rate was achieved. The heart rate response to stress was normal. There was a normal resting blood pressure with an appropriate response to stress. The rate-pressure product for the peak heart rate and blood pressure was 8832 mm Hg/min. The patient experienced no chest pain during stress.  ------------------------------------------------------------------- Stress ECG: Atrial fibrillation at baseline and stress. There was 2 mm hortizontal/ downsloping ST depression at stress in several leads with elevation in aVR.  ------------------------------------------------------------------- Baseline:  Peak stress:  ------------------------------------------------------------------- Stress echo results: EF 65% at baseline and 75% with exercise. Study not able to determine echocardiographic evidence of ischemia due to lack of standard protocol. Left ventricular ejection fraction was normal at rest and with stress.  ------------------------------------------------------------------- Measurements  Left atrium Value LA volume, S 145 ml LA volume/bsa, S 83.8 ml/m^2 LA volume, ES, 1-p A4C 122 ml LA volume/bsa, ES, 1-p A4C 70.5 ml/m^2 LA volume, ES, 1-p A2C 157 ml LA volume/bsa, ES, 1-p A2C 90.7 ml/m^2  Mitral valve Value Mitral mean velocity, D 180 cm/s Mitral mean gradient, D 14 mm Hg Mitral annulus VTI, D 37 cm Mitral regurg VTI, PISA 156 cm Mitral ERO, PISA 0.48 cm^2 Mitral regurg volume, PISA 75 ml  Legend: (L) and (H) mark values outside specified reference range.  ------------------------------------------------------------------- Prepared and Electronically Authenticated by  Candee Furbish, M.D. 2017-11-01T11:32:32   Right/Left Heart Cath and  Coronary Angiography  Conclusion     The left ventricular systolic function is normal.  LV end diastolic pressure is normal.  The left ventricular ejection fraction is 55-65% by visual estimate.  There is no aortic valve stenosis.  There is severe mitral valve stenosis.  Prox Cx to Mid Cx lesion, 50 %stenosed.  Mid RCA-2 lesion, 80 %stenosed.  Mid RCA-1 lesion, 80 %stenosed.  Hemodynamic findings consistent with mild pulmonary hypertension.  LV end diastolic pressure is normal.  1. Coronary artery disease - 50% mid LCx - 80% sequential lesions in the mid RCA 2. Normal LV function 3. Severe mitral stenosis. MV gradient of 13 mm Hg. MVA 0.99 cm squared with index 0.57. 4. Moderate to severe mitral insufficiency 5. Mild pulmonary HTN. 6. Normal LV EDP 7. No significant AV gradient 8. Occluded right brachial artery.  Plan: surgical evaluation for MVR and CABG.   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 Noninvasive evaluation is consistent with significant MR and MS.  Procedural Details: The right wrist was prepped, draped, and anesthetized with 1% lidocaine. Using the modified Seldinger technique a 6 Fr slender sheath was placed in the right radial artery. We were unable to advance a wire past the brachial artery and angiography demonstrated occlusion of the brachial artery above the elbow. We then prepped and draped the right groin. It was anesthetized with 1% lidocaine and a 5 Fr sheath was inserted in the right femoral artery using a modified Seldinger technique. A 5 French sheath was placed in the left brachial vein exchanging out the IV catheter. A Swan-Ganz catheter was used for the right heart catheterization. Standard protocol was followed for recording of  right heart pressures and sampling of oxygen saturations. Fick cardiac output was calculated. Standard Judkins catheters were used for selective coronary angiography and left ventriculography. There were no immediate procedural complications. The patient was transferred to the post catheterization recovery area for further monitoring.  Contrast: 70 cc   Estimated blood loss <50 mL.  During this procedure the patient was administered the following to achieve and maintain moderate conscious sedation: Versed 1 mg, Fentanyl 25 mcg, while the patient's heart rate, blood pressure, and oxygen saturation were continuously monitored. The period of conscious sedation was 51 minutes, of which I was present face-to-face 100% of this time.    Complications   Complications documented before study signed (08/03/2016 12:27 PM EST)   No complications were associated with this study.  Documented by Peter M Martinique, MD - 08/03/2016 12:23 PM EST    Coronary Findings   Dominance: Right  Left Main  Vessel was injected. Vessel is normal in caliber. Vessel is angiographically normal.  Left Anterior Descending  Vessel was injected. Vessel is normal in caliber. Vessel is angiographically normal.  Left Circumflex  Prox Cx to Mid Cx lesion, 50% stenosed.  Right Coronary Artery  Mid RCA-1 lesion, 80% stenosed.  Mid RCA-2 lesion, 80% stenosed.  Right Heart   Right Heart Pressures Hemodynamic findings consistent with mild pulmonary hypertension. LV EDP is normal.    Wall Motion              Left Heart   Left Ventricle The left ventricular size is normal. The left ventricular systolic function is normal. LV end diastolic pressure is normal. The left ventricular ejection fraction is 55-65% by visual estimate. No regional wall motion abnormalities. There is moderate to severe mitral regurgitation.    Mitral Valve There is severe mitral valve stenosis.    Aortic Valve There is no aortic valve stenosis.     Coronary Diagrams   Diagnostic Diagram     Implants        No implant documentation for this case.  PACS Images   Show images for Cardiac catheterization   Link to Procedure Log   Procedure Log    Hemo Data   Flowsheet Row Most Recent Value  Fick Cardiac Output 3.9 L/min  Fick Cardiac Output Index 2.27 (L/min)/BSA  Mitral Mean Gradient 13.1 mmHg  Mitral Peak Gradient 9 mmHg  Mitral Valve Area Index 0.57 cm2/BSA  RA A Wave 14 mmHg  RA V Wave 17 mmHg  RA Mean 14 mmHg  RV Systolic Pressure 52 mmHg  RV Diastolic Pressure 2 mmHg  RV EDP 14 mmHg  PA Systolic Pressure  51 mmHg  PA Diastolic Pressure 20 mmHg  PA Mean 34 mmHg  PW A Wave 32 mmHg  PW V Wave 36 mmHg  PW Mean 31 mmHg  AO Systolic Pressure [id number] mmHg  AO Diastolic Pressure 66 mmHg  AO Mean 91 mmHg  LV Systolic Pressure 0000000 mmHg  LV Diastolic Pressure 10 mmHg  LV EDP 22 mmHg  Arterial Occlusion Pressure Extended Systolic Pressure [id number] mmHg  Arterial Occlusion Pressure Extended Diastolic Pressure 66 mmHg  Arterial Occlusion Pressure Extended Mean Pressure 92 mmHg  Left Ventricular Apex Extended Systolic Pressure [id number] mmHg  Left Ventricular Apex Extended Diastolic Pressure 10 mmHg  Left Ventricular Apex Extended EDP Pressure 22 mmHg  QP/QS 1  TPVR Index 14.99 HRUI  TSVR Index 40.1 HRUI  PVR SVR Ratio 0.09  TPVR/TSVR Ratio 0.37    CT ANGIOGRAPHY CHEST, ABDOMEN AND PELVIS  TECHNIQUE: Multidetector CT imaging through the chest, abdomen and pelvis was performed using the standard protocol during bolus administration of intravenous contrast. Multiplanar reconstructed images and MIPs were obtained and reviewed to evaluate the vascular anatomy.  CONTRAST: 75 mL Isovue 370  COMPARISON: 10/27/2015 and 07/30/2007  FINDINGS: CTA CHEST FINDINGS  Cardiovascular: Normal caliber of the thoracic aorta. There is some atherosclerotic disease at the aortic arch. Atherosclerotic calcifications at  the origin of left subclavian artery and left common carotid artery. Great vessels are patent. Normal arch configuration. Mild atherosclerotic disease along the descending thoracic aorta. Negative for an aortic dissection. Pulmonary arteries are not opacified on this examination. Left atrium is markedly enlarged measuring 6.6 cm in the AP dimension and compatible with mitral valve disease.  Mediastinum/Nodes: There is no significant chest lymphadenopathy. No significant pericardial fluid.  Lungs/Pleura: No pleural effusions. Trachea and mainstem bronchi are patent. Punctate pleural-based nodule in the right upper lobe on sequence 5, image 23. Few additional tiny nodules in the right lower lobe on image 36, largest measuring roughly 3 mm. There is no significant airspace disease or consolidation in the lungs. There is mild pleural thickening at the left lung base. 3 mm nodule in the superior segment of left lower lobe on image 30. No evidence for pulmonary edema.  Musculoskeletal: Surgical plate in the lower cervical spine.  Review of the MIP images confirms the above findings.  CTA ABDOMEN AND PELVIS FINDINGS  VASCULAR  Aorta: Mild atherosclerotic disease in the abdominal aorta without aneurysm.  Celiac: Celiac trunk is patent. Mild narrowing along the origin probably related to the median arcuate ligament. Main branch vessels are patent.  SMA: SMA is patent with a replaced right hepatic artery. No significant stenosis of the SMA.  Renals: Both renal arteries are patent without evidence of aneurysm, dissection, vasculitis, fibromuscular dysplasia or significant stenosis.  IMA: IMA is patent without significant stenosis.  Inflow: Right common iliac artery has atherosclerotic disease without significant stenosis. This vessel is slightly small measuring 0.9 cm in diameter. Right external and right internal iliac arteries are patent. Right iliac arteries are  tortuous. No significant stenosis or atherosclerotic disease in the right external iliac artery. Mild atherosclerotic disease in the right common femoral artery. Small amount of fluid around the right common femoral artery compatible with recent catheterization. Negative for a pseudoaneurysm. Proximal right femoral arteries are patent. Left iliac arteries are tortuous but no significant plaque or stenosis. Mild disease in the left common femoral artery without significant stenosis. Proximal left femoral arteries are patent.  Veins: Portal venous system is patent. IVC and renal veins are widely  patent. Proximal iliac veins are patent.  Review of the MIP images confirms the above findings.  NON-VASCULAR  Hepatobiliary: Gallbladder has been removed. No acute abnormality to the liver.  Pancreas: Normal appearance of the pancreas without inflammation or duct dilatation.  Spleen: Normal appearance of spleen without enlargement.  Adrenals/Urinary Tract: Normal adrenal glands. Small cyst in left kidney upper pole. No suspicious renal lesion. No hydronephrosis. Urinary bladder is unremarkable.  Stomach/Bowel: Small hiatal hernia. Multiple small densities throughout the colon probably represent ingested tablets. No evidence for bowel obstruction or focal bowel inflammation.  Lymphatic: No significant lymph node enlargement in the abdomen or pelvis.  Reproductive: Status post hysterectomy. No adnexal masses.  Other: Multiple surgical clips throughout the pelvis. No free fluid. No free air. Mild edema in the right groin compatible with recent catheterization.  Musculoskeletal: Pedicle screw and rod fixation at L5-S1 with interbody device.  Review of the MIP images confirms the above findings.  IMPRESSION: Mild atherosclerotic disease in the aorta, iliac arteries and proximal femoral arteries. No significant stenosis or occlusive disease. Iliac arteries are  tortuous.  Changes in the right groin compatible with recent catheterization procedure but no evidence for a pseudoaneurysm.  No acute abnormality in the chest, abdomen or pelvis.  Enlarged left atrium compatible mitral valve disease.  Few punctate nodules throughout the lungs are nonspecific. No follow-up needed if patient is low-risk (and has no known or suspected primary neoplasm). Non-contrast chest CT can be considered in 12 months if patient is high-risk. This recommendation follows the consensus statement: Guidelines for Management of Incidental Pulmonary Nodules Detected on CT Images: From the Fleischner Society 2017; Radiology 2017; 284:228-243.   Electronically Signed By: Markus Daft M.D. On: 08/13/2016 15:47  Impression:  Patient has long-standing rheumatic heart disease with history of previous open mitral commissurotomy in 1970 and now presents with stage D severe symptomatic mitral regurgitation and mitral stenosis. She describes a long-standing history of symptoms consistent with chronic diastolic congestive heart failure that of progressed gradually over the last several years and are now quite limiting, consistent with [place] Heart Association functional class III. The patient also has more than 30 years history of long-standing persistent atrial fibrillation on long-term anticoagulation using warfarin. I have personally reviewed the patient's recent echocardiograms and diagnostic cardiac catheterization. Echocardiograms revealed classical rheumatic mitral valve disease with severe thickening and restricted leaflet mobility involving both leaflets of the mitral valve with severe thickening and foreshortening of the subvalvular apparatus. There is type IIIAmitral valve dysfunction with severe mitral regurgitation. There is massive enlargement of both left and right atrium. There is only mild tricuspid regurgitation but the tricuspid annulus does appear somewhat  dilated. Left ventricular systolic function is normal. There is no significant aortic valve disease. Diagnostic cardiac catheterization confirmed the presence of normal left ventricular systolic function and reveals only mild pulmonary hypertension, but also revealed multivessel coronary artery disease with tandem 80% stenosis in the right coronary artery. I agree the patient needs mitral valve replacement and coronary artery bypass grafting. She might benefit from concomitant maze procedure in terms of improved rhythm control and decreased risk of thromboembolism, although the likelihood of restoration of sinus rhythm is probably very low because of the long duration of atrial fibrillation. There is a significant likelihood that the patient might require permanent pacemaker placement because of symptomatic bradycardia if maze procedure is performed. Risks associated with surgery will be somewhat elevated because of the patient's advanced age, previous cardiac surgery, long-standing rheumatic disease,  and other comorbid medical problems.  At this point the cellulitis involving the right lower leg has essentially resolved and I feel there is very little associated risk of infection. It may take several months for the ulceration to heal completely and I do not feel that there is any reason to postpone cardiac surgery any longer.  Plan:  We plan to proceed with redo median sternotomy for mitral valve replacement using a bioprosthetic tissue valve, Maze procedure, and coronary artery bypass grafting on 10/14/2016. I've again reviewed the indications, risks, and potential benefits of surgery with the patient and her husband.  The patient understands and accepts all potential risks of surgery including but not limited to risk of death, stroke or other neurologic complication, myocardial infarction, congestive heart failure, respiratory failure, renal failure, bleeding requiring transfusion and/or reexploration,  arrhythmia, infection or other wound complications, pneumonia, pleural and/or pericardial effusion, pulmonary embolus, aortic dissection or other major vascular complication, recurrence of symptomatic ischemic heart disease, or delayed complications related to valve repair or replacement including but not limited to structural valve deterioration and failure, thrombosis, embolization, endocarditis, or paravalvular leak. The relative risks and benefits of performing a maze procedure at the time of their surgery was discussed at length, including the expected likelihood of long term freedom from recurrent symptomatic atrial fibrillation and/or atrial flutter as well as the increased risk of need for permanent pacemaker placement. All of their questions have been answered.  The patient has been instructed to stop taking Coumadin after she takes her dose the Thursday. She will begin Lovenox injections for bridging therapy on Saturday. On the morning of surgery she will take only atenolol with a sip of water. She may also take Valium and use her inhaler if she feels she needs either.       Valentina Gu. Roxy Manns, MD 10/04/2016 5:25 PM

## 2016-10-14 ENCOUNTER — Inpatient Hospital Stay (HOSPITAL_COMMUNITY): Payer: Medicare Other

## 2016-10-14 ENCOUNTER — Encounter (HOSPITAL_COMMUNITY)
Admission: RE | Disposition: A | Discharge status: Home Health | Payer: Self-pay | Source: Ambulatory Visit | Attending: Thoracic Surgery (Cardiothoracic Vascular Surgery)

## 2016-10-14 ENCOUNTER — Encounter (HOSPITAL_COMMUNITY): Payer: Self-pay | Admitting: Urology

## 2016-10-14 ENCOUNTER — Inpatient Hospital Stay (HOSPITAL_COMMUNITY)
Admission: RE | Admit: 2016-10-14 | Discharge: 2016-10-22 | DRG: 219 | Disposition: A | Discharge status: Home Health | Payer: Medicare Other | Source: Ambulatory Visit | Attending: Thoracic Surgery (Cardiothoracic Vascular Surgery) | Admitting: Thoracic Surgery (Cardiothoracic Vascular Surgery)

## 2016-10-14 ENCOUNTER — Inpatient Hospital Stay (HOSPITAL_COMMUNITY): Payer: Medicare Other | Admitting: Vascular Surgery

## 2016-10-14 DIAGNOSIS — I482 Chronic atrial fibrillation, unspecified: Secondary | ICD-10-CM | POA: Diagnosis present

## 2016-10-14 DIAGNOSIS — Z885 Allergy status to narcotic agent status: Secondary | ICD-10-CM

## 2016-10-14 DIAGNOSIS — N189 Chronic kidney disease, unspecified: Secondary | ICD-10-CM | POA: Diagnosis present

## 2016-10-14 DIAGNOSIS — Z9889 Other specified postprocedural states: Secondary | ICD-10-CM

## 2016-10-14 DIAGNOSIS — I2510 Atherosclerotic heart disease of native coronary artery without angina pectoris: Secondary | ICD-10-CM | POA: Diagnosis present

## 2016-10-14 DIAGNOSIS — Z880 Allergy status to penicillin: Secondary | ICD-10-CM | POA: Diagnosis not present

## 2016-10-14 DIAGNOSIS — J9811 Atelectasis: Secondary | ICD-10-CM | POA: Diagnosis not present

## 2016-10-14 DIAGNOSIS — N390 Urinary tract infection, site not specified: Secondary | ICD-10-CM | POA: Diagnosis present

## 2016-10-14 DIAGNOSIS — Z881 Allergy status to other antibiotic agents status: Secondary | ICD-10-CM

## 2016-10-14 DIAGNOSIS — I4891 Unspecified atrial fibrillation: Secondary | ICD-10-CM | POA: Diagnosis present

## 2016-10-14 DIAGNOSIS — I272 Pulmonary hypertension, unspecified: Secondary | ICD-10-CM | POA: Diagnosis not present

## 2016-10-14 DIAGNOSIS — Z87891 Personal history of nicotine dependence: Secondary | ICD-10-CM | POA: Diagnosis not present

## 2016-10-14 DIAGNOSIS — I5033 Acute on chronic diastolic (congestive) heart failure: Secondary | ICD-10-CM | POA: Diagnosis present

## 2016-10-14 DIAGNOSIS — I081 Rheumatic disorders of both mitral and tricuspid valves: Principal | ICD-10-CM | POA: Diagnosis present

## 2016-10-14 DIAGNOSIS — Z7901 Long term (current) use of anticoagulants: Secondary | ICD-10-CM | POA: Diagnosis not present

## 2016-10-14 DIAGNOSIS — L03115 Cellulitis of right lower limb: Secondary | ICD-10-CM | POA: Diagnosis present

## 2016-10-14 DIAGNOSIS — I38 Endocarditis, valve unspecified: Secondary | ICD-10-CM | POA: Diagnosis present

## 2016-10-14 DIAGNOSIS — Z953 Presence of xenogenic heart valve: Secondary | ICD-10-CM

## 2016-10-14 DIAGNOSIS — Z8679 Personal history of other diseases of the circulatory system: Secondary | ICD-10-CM

## 2016-10-14 DIAGNOSIS — I340 Nonrheumatic mitral (valve) insufficiency: Secondary | ICD-10-CM | POA: Diagnosis not present

## 2016-10-14 DIAGNOSIS — Z951 Presence of aortocoronary bypass graft: Secondary | ICD-10-CM

## 2016-10-14 DIAGNOSIS — I872 Venous insufficiency (chronic) (peripheral): Secondary | ICD-10-CM | POA: Diagnosis present

## 2016-10-14 DIAGNOSIS — Z888 Allergy status to other drugs, medicaments and biological substances status: Secondary | ICD-10-CM

## 2016-10-14 DIAGNOSIS — F419 Anxiety disorder, unspecified: Secondary | ICD-10-CM | POA: Diagnosis present

## 2016-10-14 DIAGNOSIS — I509 Heart failure, unspecified: Secondary | ICD-10-CM | POA: Diagnosis not present

## 2016-10-14 DIAGNOSIS — Z79899 Other long term (current) drug therapy: Secondary | ICD-10-CM

## 2016-10-14 DIAGNOSIS — I5032 Chronic diastolic (congestive) heart failure: Secondary | ICD-10-CM | POA: Diagnosis present

## 2016-10-14 DIAGNOSIS — J90 Pleural effusion, not elsewhere classified: Secondary | ICD-10-CM | POA: Diagnosis not present

## 2016-10-14 DIAGNOSIS — D6959 Other secondary thrombocytopenia: Secondary | ICD-10-CM | POA: Diagnosis not present

## 2016-10-14 DIAGNOSIS — J441 Chronic obstructive pulmonary disease with (acute) exacerbation: Secondary | ICD-10-CM | POA: Diagnosis present

## 2016-10-14 DIAGNOSIS — I050 Rheumatic mitral stenosis: Secondary | ICD-10-CM | POA: Diagnosis present

## 2016-10-14 DIAGNOSIS — I481 Persistent atrial fibrillation: Secondary | ICD-10-CM | POA: Diagnosis present

## 2016-10-14 DIAGNOSIS — D62 Acute posthemorrhagic anemia: Secondary | ICD-10-CM | POA: Diagnosis not present

## 2016-10-14 DIAGNOSIS — R0609 Other forms of dyspnea: Secondary | ICD-10-CM | POA: Diagnosis present

## 2016-10-14 DIAGNOSIS — I059 Rheumatic mitral valve disease, unspecified: Secondary | ICD-10-CM | POA: Diagnosis not present

## 2016-10-14 DIAGNOSIS — Z452 Encounter for adjustment and management of vascular access device: Secondary | ICD-10-CM | POA: Diagnosis not present

## 2016-10-14 DIAGNOSIS — J449 Chronic obstructive pulmonary disease, unspecified: Secondary | ICD-10-CM | POA: Diagnosis not present

## 2016-10-14 DIAGNOSIS — Z4682 Encounter for fitting and adjustment of non-vascular catheter: Secondary | ICD-10-CM | POA: Diagnosis not present

## 2016-10-14 DIAGNOSIS — I342 Nonrheumatic mitral (valve) stenosis: Secondary | ICD-10-CM | POA: Diagnosis not present

## 2016-10-14 DIAGNOSIS — M1990 Unspecified osteoarthritis, unspecified site: Secondary | ICD-10-CM | POA: Diagnosis present

## 2016-10-14 DIAGNOSIS — I10 Essential (primary) hypertension: Secondary | ICD-10-CM | POA: Diagnosis present

## 2016-10-14 DIAGNOSIS — I130 Hypertensive heart and chronic kidney disease with heart failure and stage 1 through stage 4 chronic kidney disease, or unspecified chronic kidney disease: Secondary | ICD-10-CM | POA: Diagnosis present

## 2016-10-14 DIAGNOSIS — R0600 Dyspnea, unspecified: Secondary | ICD-10-CM | POA: Diagnosis present

## 2016-10-14 DIAGNOSIS — I099 Rheumatic heart disease, unspecified: Secondary | ICD-10-CM | POA: Diagnosis present

## 2016-10-14 HISTORY — DX: Personal history of other diseases of the circulatory system: Z86.79

## 2016-10-14 HISTORY — PX: MAZE: SHX5063

## 2016-10-14 HISTORY — DX: Presence of xenogenic heart valve: Z95.3

## 2016-10-14 HISTORY — DX: Other specified postprocedural states: Z98.890

## 2016-10-14 HISTORY — PX: CORONARY ARTERY BYPASS GRAFT: SHX141

## 2016-10-14 HISTORY — PX: TEE WITHOUT CARDIOVERSION: SHX5443

## 2016-10-14 HISTORY — PX: MITRAL VALVE REPLACEMENT: SHX147

## 2016-10-14 LAB — POCT I-STAT 4, (NA,K, GLUC, HGB,HCT)
Glucose, Bld: 92 mg/dL (ref 65–99)
HCT: 27 % — ABNORMAL LOW (ref 36.0–46.0)
Hemoglobin: 9.2 g/dL — ABNORMAL LOW (ref 12.0–15.0)
Potassium: 3.4 mmol/L — ABNORMAL LOW (ref 3.5–5.1)
SODIUM: 145 mmol/L (ref 135–145)

## 2016-10-14 LAB — POCT I-STAT, CHEM 8
BUN: 15 mg/dL (ref 6–20)
BUN: 12 mg/dL (ref 6–20)
BUN: 14 mg/dL (ref 6–20)
BUN: 14 mg/dL (ref 6–20)
BUN: 13 mg/dL (ref 6–20)
BUN: 15 mg/dL (ref 6–20)
BUN: 11 mg/dL (ref 6–20)
CALCIUM ION: 1.22 mmol/L (ref 1.15–1.40)
CALCIUM ION: 1.06 mmol/L — AB (ref 1.15–1.40)
CALCIUM ION: 1.14 mmol/L — AB (ref 1.15–1.40)
CHLORIDE: 105 mmol/L (ref 101–111)
CHLORIDE: 104 mmol/L (ref 101–111)
CHLORIDE: 103 mmol/L (ref 101–111)
CHLORIDE: 105 mmol/L (ref 101–111)
CREATININE: 0.3 mg/dL — AB (ref 0.44–1.00)
CREATININE: 0.4 mg/dL — AB (ref 0.44–1.00)
Calcium, Ion: 1.28 mmol/L (ref 1.15–1.40)
Calcium, Ion: 1 mmol/L — ABNORMAL LOW (ref 1.15–1.40)
Calcium, Ion: 1.06 mmol/L — ABNORMAL LOW (ref 1.15–1.40)
Calcium, Ion: 1.04 mmol/L — ABNORMAL LOW (ref 1.15–1.40)
Chloride: 105 mmol/L (ref 101–111)
Chloride: 99 mmol/L — ABNORMAL LOW (ref 101–111)
Chloride: 112 mmol/L — ABNORMAL HIGH (ref 101–111)
Creatinine, Ser: 0.4 mg/dL — ABNORMAL LOW (ref 0.44–1.00)
Creatinine, Ser: 0.3 mg/dL — ABNORMAL LOW (ref 0.44–1.00)
Creatinine, Ser: 0.4 mg/dL — ABNORMAL LOW (ref 0.44–1.00)
Creatinine, Ser: 0.3 mg/dL — ABNORMAL LOW (ref 0.44–1.00)
Creatinine, Ser: 0.6 mg/dL (ref 0.44–1.00)
GLUCOSE: 120 mg/dL — AB (ref 65–99)
GLUCOSE: 151 mg/dL — AB (ref 65–99)
GLUCOSE: 153 mg/dL — AB (ref 65–99)
GLUCOSE: 106 mg/dL — AB (ref 65–99)
Glucose, Bld: 115 mg/dL — ABNORMAL HIGH (ref 65–99)
Glucose, Bld: 132 mg/dL — ABNORMAL HIGH (ref 65–99)
Glucose, Bld: 157 mg/dL — ABNORMAL HIGH (ref 65–99)
HCT: 26 % — ABNORMAL LOW (ref 36.0–46.0)
HCT: 35 % — ABNORMAL LOW (ref 36.0–46.0)
HCT: 25 % — ABNORMAL LOW (ref 36.0–46.0)
HCT: 25 % — ABNORMAL LOW (ref 36.0–46.0)
HEMATOCRIT: 35 % — AB (ref 36.0–46.0)
HEMATOCRIT: 24 % — AB (ref 36.0–46.0)
HEMATOCRIT: 30 % — AB (ref 36.0–46.0)
HEMOGLOBIN: 8.8 g/dL — AB (ref 12.0–15.0)
Hemoglobin: 11.9 g/dL — ABNORMAL LOW (ref 12.0–15.0)
Hemoglobin: 11.9 g/dL — ABNORMAL LOW (ref 12.0–15.0)
Hemoglobin: 8.2 g/dL — ABNORMAL LOW (ref 12.0–15.0)
Hemoglobin: 10.2 g/dL — ABNORMAL LOW (ref 12.0–15.0)
Hemoglobin: 8.5 g/dL — ABNORMAL LOW (ref 12.0–15.0)
Hemoglobin: 8.5 g/dL — ABNORMAL LOW (ref 12.0–15.0)
POTASSIUM: 3.7 mmol/L (ref 3.5–5.1)
POTASSIUM: 3.9 mmol/L (ref 3.5–5.1)
POTASSIUM: 4.6 mmol/L (ref 3.5–5.1)
POTASSIUM: 4 mmol/L (ref 3.5–5.1)
POTASSIUM: 5.1 mmol/L (ref 3.5–5.1)
Potassium: 3.3 mmol/L — ABNORMAL LOW (ref 3.5–5.1)
Potassium: 4.3 mmol/L (ref 3.5–5.1)
SODIUM: 139 mmol/L (ref 135–145)
SODIUM: 141 mmol/L (ref 135–145)
SODIUM: 145 mmol/L (ref 135–145)
Sodium: 142 mmol/L (ref 135–145)
Sodium: 139 mmol/L (ref 135–145)
Sodium: 138 mmol/L (ref 135–145)
Sodium: 142 mmol/L (ref 135–145)
TCO2: 29 mmol/L (ref 0–100)
TCO2: 28 mmol/L (ref 0–100)
TCO2: 28 mmol/L (ref 0–100)
TCO2: 30 mmol/L (ref 0–100)
TCO2: 27 mmol/L (ref 0–100)
TCO2: 28 mmol/L (ref 0–100)
TCO2: 26 mmol/L (ref 0–100)

## 2016-10-14 LAB — PREPARE RBC (CROSSMATCH)

## 2016-10-14 LAB — POCT I-STAT 3, ART BLOOD GAS (G3+)
BICARBONATE: 25.8 mmol/L (ref 20.0–28.0)
BICARBONATE: 26.9 mmol/L (ref 20.0–28.0)
BICARBONATE: 25.6 mmol/L (ref 20.0–28.0)
Bicarbonate: 25.1 mmol/L (ref 20.0–28.0)
O2 SAT: 100 %
O2 SAT: 100 %
O2 Saturation: 99 %
O2 Saturation: 99 %
PH ART: 7.375 (ref 7.350–7.450)
PO2 ART: 408 mmHg — AB (ref 83.0–108.0)
TCO2: 26 mmol/L (ref 0–100)
TCO2: 27 mmol/L (ref 0–100)
TCO2: 29 mmol/L (ref 0–100)
TCO2: 27 mmol/L (ref 0–100)
pCO2 arterial: 42.3 mmHg (ref 32.0–48.0)
pCO2 arterial: 43.7 mmHg (ref 32.0–48.0)
pCO2 arterial: 55.1 mmHg — ABNORMAL HIGH (ref 32.0–48.0)
pCO2 arterial: 44.1 mmHg (ref 32.0–48.0)
pH, Arterial: 7.381 (ref 7.350–7.450)
pH, Arterial: 7.378 (ref 7.350–7.450)
pH, Arterial: 7.297 — ABNORMAL LOW (ref 7.350–7.450)
pO2, Arterial: 457 mmHg — ABNORMAL HIGH (ref 83.0–108.0)
pO2, Arterial: 133 mmHg — ABNORMAL HIGH (ref 83.0–108.0)
pO2, Arterial: 141 mmHg — ABNORMAL HIGH (ref 83.0–108.0)

## 2016-10-14 LAB — GLUCOSE, CAPILLARY
GLUCOSE-CAPILLARY: 122 mg/dL — AB (ref 65–99)
GLUCOSE-CAPILLARY: 118 mg/dL — AB (ref 65–99)
Glucose-Capillary: 99 mg/dL (ref 65–99)
Glucose-Capillary: 95 mg/dL (ref 65–99)
Glucose-Capillary: 117 mg/dL — ABNORMAL HIGH (ref 65–99)
Glucose-Capillary: 113 mg/dL — ABNORMAL HIGH (ref 65–99)
Glucose-Capillary: 101 mg/dL — ABNORMAL HIGH (ref 65–99)

## 2016-10-14 LAB — PROTIME-INR
INR: 1.15
INR: 1.39
INR: 1.65
PROTHROMBIN TIME: 14.8 s (ref 11.4–15.2)
PROTHROMBIN TIME: 19.7 s — AB (ref 11.4–15.2)
Prothrombin Time: 17.2 seconds — ABNORMAL HIGH (ref 11.4–15.2)

## 2016-10-14 LAB — HEMOGLOBIN AND HEMATOCRIT, BLOOD
HCT: 26.7 % — ABNORMAL LOW (ref 36.0–46.0)
HCT: 30.4 % — ABNORMAL LOW (ref 36.0–46.0)
Hemoglobin: 9.1 g/dL — ABNORMAL LOW (ref 12.0–15.0)
Hemoglobin: 10.5 g/dL — ABNORMAL LOW (ref 12.0–15.0)

## 2016-10-14 LAB — CBC
HCT: 26.7 % — ABNORMAL LOW (ref 36.0–46.0)
HEMATOCRIT: 28.5 % — AB (ref 36.0–46.0)
HEMOGLOBIN: 9.7 g/dL — AB (ref 12.0–15.0)
Hemoglobin: 9.1 g/dL — ABNORMAL LOW (ref 12.0–15.0)
MCH: 30.8 pg (ref 26.0–34.0)
MCH: 31.1 pg (ref 26.0–34.0)
MCHC: 34 g/dL (ref 30.0–36.0)
MCHC: 34.1 g/dL (ref 30.0–36.0)
MCV: 90.5 fL (ref 78.0–100.0)
MCV: 91.1 fL (ref 78.0–100.0)
PLATELETS: 129 10*3/uL — AB (ref 150–400)
Platelets: 118 10*3/uL — ABNORMAL LOW (ref 150–400)
RBC: 3.15 MIL/uL — ABNORMAL LOW (ref 3.87–5.11)
RBC: 2.93 MIL/uL — ABNORMAL LOW (ref 3.87–5.11)
RDW: 14.1 % (ref 11.5–15.5)
RDW: 14.2 % (ref 11.5–15.5)
WBC: 11.7 10*3/uL — AB (ref 4.0–10.5)
WBC: 13.4 10*3/uL — ABNORMAL HIGH (ref 4.0–10.5)

## 2016-10-14 LAB — CREATININE, SERUM
Creatinine, Ser: 0.61 mg/dL (ref 0.44–1.00)
GFR calc Af Amer: >60 mL/min (ref >60)
GFR calc non Af Amer: >60 mL/min (ref >60)

## 2016-10-14 LAB — PLATELET COUNT
Platelets: 127 10*3/uL — ABNORMAL LOW (ref 150–400)
Platelets: 107 10*3/uL — ABNORMAL LOW (ref 150–400)

## 2016-10-14 LAB — APTT
APTT: 33 s (ref 24–36)
APTT: 37 s — AB (ref 24–36)
APTT: 41 s — AB (ref 24–36)

## 2016-10-14 LAB — FIBRINOGEN: FIBRINOGEN: 182 mg/dL — AB (ref 210–475)

## 2016-10-14 LAB — MAGNESIUM: MAGNESIUM: 2.8 mg/dL — AB (ref 1.7–2.4)

## 2016-10-14 SURGERY — REPLACEMENT, MITRAL VALVE, REPEAT
Anesthesia: General | Site: Chest

## 2016-10-14 MED ORDER — SODIUM CHLORIDE 0.9 % IV SOLN
0.0000 ug/min | INTRAVENOUS | Status: DC
  Administered 2016-10-14: 20 ug/min via INTRAVENOUS
  Filled 2016-10-14: qty 2

## 2016-10-14 MED ORDER — BISACODYL 10 MG RE SUPP: 10.0000 mg | Freq: Every day | RECTAL | Status: DC

## 2016-10-14 MED ORDER — FENTANYL CITRATE (PF) 250 MCG/5ML IJ SOLN
INTRAMUSCULAR | Status: DC | PRN
  Administered 2016-10-14 (×3): 150 ug via INTRAVENOUS
  Administered 2016-10-14: 100 ug via INTRAVENOUS
  Administered 2016-10-14: 50 ug via INTRAVENOUS
  Administered 2016-10-14: 100 ug via INTRAVENOUS
  Administered 2016-10-14 (×2): 50 ug via INTRAVENOUS
  Administered 2016-10-14: 100 ug via INTRAVENOUS
  Administered 2016-10-14 (×2): 50 ug via INTRAVENOUS
  Administered 2016-10-14: 150 ug via INTRAVENOUS
  Administered 2016-10-14: 100 ug via INTRAVENOUS
  Administered 2016-10-14: 50 ug via INTRAVENOUS
  Administered 2016-10-14 (×2): 100 ug via INTRAVENOUS

## 2016-10-14 MED ORDER — ROCURONIUM BROMIDE 50 MG/5ML IV SOSY
PREFILLED_SYRINGE | INTRAVENOUS | Status: AC
  Filled 2016-10-14: qty 5

## 2016-10-14 MED ORDER — MIDAZOLAM HCL 2 MG/2ML IJ SOLN: 2.0000 mg | INTRAMUSCULAR | Status: DC | PRN

## 2016-10-14 MED ORDER — ASPIRIN EC 325 MG PO TBEC
325.0000 mg | DELAYED_RELEASE_TABLET | Freq: Every day | ORAL | Status: DC
  Administered 2016-10-15 – 2016-10-17 (×3): 325 mg via ORAL
  Filled 2016-10-14 (×3): qty 1

## 2016-10-14 MED ORDER — DEXMEDETOMIDINE HCL IN NACL 200 MCG/50ML IV SOLN: 0.0000 ug/kg/h | INTRAVENOUS | Status: DC

## 2016-10-14 MED ORDER — CHLORHEXIDINE GLUCONATE 0.12 % MT SOLN
15.0000 mL | Freq: Once | OROMUCOSAL | Status: AC
  Administered 2016-10-14: 15 mL via OROMUCOSAL
  Filled 2016-10-14: qty 15

## 2016-10-14 MED ORDER — MAGNESIUM SULFATE 4 GM/100ML IV SOLN
4.0000 g | Freq: Once | INTRAVENOUS | Status: AC
  Administered 2016-10-14: 4 g via INTRAVENOUS
  Filled 2016-10-14: qty 100

## 2016-10-14 MED ORDER — DOCUSATE SODIUM 100 MG PO CAPS
200.0000 mg | ORAL_CAPSULE | Freq: Every day | ORAL | Status: DC
  Administered 2016-10-15 – 2016-10-18 (×4): 200 mg via ORAL
  Filled 2016-10-14 (×6): qty 2

## 2016-10-14 MED ORDER — BISACODYL 5 MG PO TBEC
10.0000 mg | DELAYED_RELEASE_TABLET | Freq: Every day | ORAL | Status: DC
  Administered 2016-10-15 – 2016-10-18 (×4): 10 mg via ORAL
  Filled 2016-10-14 (×6): qty 2

## 2016-10-14 MED ORDER — ROCURONIUM BROMIDE 50 MG/5ML IV SOSY
PREFILLED_SYRINGE | INTRAVENOUS | Status: AC
Start: 2016-10-14 — End: 2016-10-14
  Filled 2016-10-14: qty 5

## 2016-10-14 MED ORDER — ROCURONIUM BROMIDE 10 MG/ML (PF) SYRINGE
PREFILLED_SYRINGE | INTRAVENOUS | Status: DC | PRN
  Administered 2016-10-14 (×4): 50 mg via INTRAVENOUS

## 2016-10-14 MED ORDER — MORPHINE SULFATE (PF) 2 MG/ML IV SOLN
1.0000 mg | INTRAVENOUS | Status: DC | PRN
  Filled 2016-10-14: qty 1

## 2016-10-14 MED ORDER — ARTIFICIAL TEARS OP OINT
TOPICAL_OINTMENT | OPHTHALMIC | Status: DC | PRN
  Administered 2016-10-14: 1 via OPHTHALMIC

## 2016-10-14 MED ORDER — SODIUM CHLORIDE 0.45 % IV SOLN: INTRAVENOUS | Status: DC | PRN

## 2016-10-14 MED ORDER — CHLORHEXIDINE GLUCONATE 4 % EX LIQD: 30.0000 mL | CUTANEOUS | Status: DC

## 2016-10-14 MED ORDER — FAMOTIDINE IN NACL 20-0.9 MG/50ML-% IV SOLN
20.0000 mg | Freq: Two times a day (BID) | INTRAVENOUS | Status: AC
  Administered 2016-10-14 – 2016-10-15 (×2): 20 mg via INTRAVENOUS
  Filled 2016-10-14 (×2): qty 50

## 2016-10-14 MED ORDER — CHLORHEXIDINE GLUCONATE 0.12 % MT SOLN
15.0000 mL | OROMUCOSAL | Status: AC
  Administered 2016-10-14: 15 mL via OROMUCOSAL

## 2016-10-14 MED ORDER — INSULIN REGULAR BOLUS VIA INFUSION
0.0000 [IU] | Freq: Three times a day (TID) | INTRAVENOUS | Status: DC
  Filled 2016-10-14: qty 10

## 2016-10-14 MED ORDER — MIDAZOLAM HCL 5 MG/5ML IJ SOLN
INTRAMUSCULAR | Status: DC | PRN
  Administered 2016-10-14 (×5): 2 mg via INTRAVENOUS

## 2016-10-14 MED ORDER — MIDAZOLAM HCL 10 MG/2ML IJ SOLN
INTRAMUSCULAR | Status: AC
  Filled 2016-10-14: qty 2

## 2016-10-14 MED ORDER — PROPOFOL 10 MG/ML IV BOLUS
INTRAVENOUS | Status: AC
  Filled 2016-10-14: qty 20

## 2016-10-14 MED ORDER — LIDOCAINE HCL (CARDIAC) 20 MG/ML IV SOLN
INTRAVENOUS | Status: DC | PRN
  Administered 2016-10-14: 60 mg via INTRAVENOUS

## 2016-10-14 MED ORDER — SODIUM CHLORIDE 0.9 % IJ SOLN
OROMUCOSAL | Status: DC | PRN
  Administered 2016-10-14 (×3): via TOPICAL

## 2016-10-14 MED ORDER — METOPROLOL TARTRATE 5 MG/5ML IV SOLN
2.5000 mg | INTRAVENOUS | Status: DC | PRN
Start: 2016-10-14 — End: 2016-10-22

## 2016-10-14 MED ORDER — THROMBIN 20000 UNITS EX SOLR
CUTANEOUS | Status: AC
  Filled 2016-10-14: qty 20000

## 2016-10-14 MED ORDER — SODIUM CHLORIDE 0.9% FLUSH
3.0000 mL | INTRAVENOUS | Status: DC | PRN
Start: 2016-10-15 — End: 2016-10-22

## 2016-10-14 MED ORDER — ALBUMIN HUMAN 5 % IV SOLN
INTRAVENOUS | Status: DC | PRN
  Administered 2016-10-14: 15:00:00 via INTRAVENOUS

## 2016-10-14 MED ORDER — SODIUM CHLORIDE 0.9 % IV SOLN
INTRAVENOUS | Status: DC
  Administered 2016-10-14: 1.7 [IU]/h via INTRAVENOUS
  Filled 2016-10-14: qty 2.5

## 2016-10-14 MED ORDER — FENTANYL CITRATE (PF) 250 MCG/5ML IJ SOLN
INTRAMUSCULAR | Status: AC
  Filled 2016-10-14: qty 30

## 2016-10-14 MED ORDER — METOPROLOL TARTRATE 25 MG/10 ML ORAL SUSPENSION: 12.5000 mg | Freq: Two times a day (BID) | ORAL | Status: DC

## 2016-10-14 MED ORDER — ACETAMINOPHEN 160 MG/5ML PO SOLN
650.0000 mg | Freq: Once | ORAL | Status: AC
Start: 2016-10-14 — End: 2016-10-14
  Filled 2016-10-14: qty 20.3

## 2016-10-14 MED ORDER — SODIUM CHLORIDE 0.9% FLUSH
3.0000 mL | Freq: Two times a day (BID) | INTRAVENOUS | Status: DC
  Administered 2016-10-15 – 2016-10-21 (×7): 3 mL via INTRAVENOUS

## 2016-10-14 MED ORDER — PROTAMINE SULFATE 10 MG/ML IV SOLN
INTRAVENOUS | Status: DC | PRN
  Administered 2016-10-14: 200 mg via INTRAVENOUS

## 2016-10-14 MED ORDER — ACETAMINOPHEN 160 MG/5ML PO SOLN
1000.0000 mg | Freq: Four times a day (QID) | ORAL | Status: DC
  Administered 2016-10-14: 1000 mg
  Filled 2016-10-14: qty 40
  Filled 2016-10-14: qty 40.6

## 2016-10-14 MED ORDER — ONDANSETRON HCL 4 MG/2ML IJ SOLN
4.0000 mg | Freq: Four times a day (QID) | INTRAMUSCULAR | Status: DC | PRN
  Administered 2016-10-14 – 2016-10-17 (×4): 4 mg via INTRAVENOUS
  Filled 2016-10-14 (×4): qty 2

## 2016-10-14 MED ORDER — ALBUMIN HUMAN 5 % IV SOLN
250.0000 mL | INTRAVENOUS | Status: AC | PRN
  Administered 2016-10-14: 250 mL via INTRAVENOUS
  Filled 2016-10-14: qty 250

## 2016-10-14 MED ORDER — SODIUM CHLORIDE 0.9 % IV SOLN: 250.0000 mL | INTRAVENOUS | Status: DC

## 2016-10-14 MED ORDER — LEVOFLOXACIN IN D5W 750 MG/150ML IV SOLN
750.0000 mg | INTRAVENOUS | Status: AC
  Administered 2016-10-15: 750 mg via INTRAVENOUS
  Filled 2016-10-14: qty 150

## 2016-10-14 MED ORDER — LACTATED RINGERS IV SOLN: INTRAVENOUS | Status: DC

## 2016-10-14 MED ORDER — LACTATED RINGERS IV SOLN
INTRAVENOUS | Status: DC | PRN
  Administered 2016-10-14 (×2): via INTRAVENOUS

## 2016-10-14 MED ORDER — MORPHINE SULFATE (PF) 2 MG/ML IV SOLN
1.0000 mg | INTRAVENOUS | Status: DC | PRN
  Administered 2016-10-15 – 2016-10-16 (×5): 2 mg via INTRAVENOUS
  Filled 2016-10-14 (×4): qty 1

## 2016-10-14 MED ORDER — METOPROLOL TARTRATE 12.5 MG HALF TABLET
12.5000 mg | ORAL_TABLET | Freq: Two times a day (BID) | ORAL | Status: DC
  Administered 2016-10-15: 12.5 mg via ORAL
  Filled 2016-10-14: qty 1

## 2016-10-14 MED ORDER — PROPOFOL 10 MG/ML IV BOLUS
INTRAVENOUS | Status: DC | PRN
Start: 2016-10-14 — End: 2016-10-14
  Administered 2016-10-14 (×2): 20 mg via INTRAVENOUS
  Administered 2016-10-14: 30 mg via INTRAVENOUS

## 2016-10-14 MED ORDER — SODIUM CHLORIDE 0.9 % IV SOLN
30.0000 meq | Freq: Once | INTRAVENOUS | Status: AC
  Administered 2016-10-14: 30 meq via INTRAVENOUS
  Filled 2016-10-14: qty 15

## 2016-10-14 MED ORDER — SODIUM CHLORIDE 0.9 % IV SOLN: INTRAVENOUS | Status: DC

## 2016-10-14 MED ORDER — ACETAMINOPHEN 650 MG RE SUPP
650.0000 mg | Freq: Once | RECTAL | Status: AC
  Administered 2016-10-14: 650 mg via RECTAL
  Filled 2016-10-14: qty 1

## 2016-10-14 MED ORDER — SODIUM CHLORIDE 0.9 % IR SOLN
Status: DC | PRN
Start: 2016-10-14 — End: 2016-10-14
  Administered 2016-10-14: 1000 mL

## 2016-10-14 MED ORDER — ASPIRIN 81 MG PO CHEW: 324.0000 mg | CHEWABLE_TABLET | Freq: Every day | ORAL | Status: DC

## 2016-10-14 MED ORDER — PANTOPRAZOLE SODIUM 40 MG PO TBEC
40.0000 mg | DELAYED_RELEASE_TABLET | Freq: Every day | ORAL | Status: DC
  Administered 2016-10-16 – 2016-10-22 (×7): 40 mg via ORAL
  Filled 2016-10-14 (×7): qty 1

## 2016-10-14 MED ORDER — HEPARIN SODIUM (PORCINE) 1000 UNIT/ML IJ SOLN
INTRAMUSCULAR | Status: DC | PRN
  Administered 2016-10-14: 21000 [IU] via INTRAVENOUS

## 2016-10-14 MED ORDER — OXYCODONE HCL 5 MG PO TABS
5.0000 mg | ORAL_TABLET | ORAL | Status: DC | PRN
  Administered 2016-10-15 (×2): 10 mg via ORAL
  Administered 2016-10-18: 5 mg via ORAL
  Filled 2016-10-14 (×2): qty 2
  Filled 2016-10-14: qty 1

## 2016-10-14 MED ORDER — ACETAMINOPHEN 500 MG PO TABS
1000.0000 mg | ORAL_TABLET | Freq: Four times a day (QID) | ORAL | Status: AC
  Administered 2016-10-15 – 2016-10-19 (×13): 1000 mg via ORAL
  Filled 2016-10-14 (×13): qty 2

## 2016-10-14 MED ORDER — NITROGLYCERIN IN D5W 200-5 MCG/ML-% IV SOLN
0.0000 ug/min | INTRAVENOUS | Status: DC
Start: 2016-10-14 — End: 2016-10-15

## 2016-10-14 MED ORDER — METOPROLOL TARTRATE 12.5 MG HALF TABLET: 12.5000 mg | ORAL_TABLET | Freq: Once | ORAL | Status: DC

## 2016-10-14 MED ORDER — TRAMADOL HCL 50 MG PO TABS
50.0000 mg | ORAL_TABLET | ORAL | Status: DC | PRN
  Administered 2016-10-18 – 2016-10-22 (×3): 50 mg via ORAL
  Filled 2016-10-14 (×3): qty 1

## 2016-10-14 MED ORDER — VANCOMYCIN HCL IN DEXTROSE 1-5 GM/200ML-% IV SOLN
1000.0000 mg | Freq: Once | INTRAVENOUS | Status: AC
  Administered 2016-10-15: 1000 mg via INTRAVENOUS
  Filled 2016-10-14: qty 200

## 2016-10-14 MED ORDER — LIDOCAINE 2% (20 MG/ML) 5 ML SYRINGE
INTRAMUSCULAR | Status: AC
  Filled 2016-10-14: qty 5

## 2016-10-14 MED ORDER — LACTATED RINGERS IV SOLN: 500.0000 mL | Freq: Once | INTRAVENOUS | Status: DC | PRN

## 2016-10-14 SURGICAL SUPPLY — 144 items
ADAPTER CARDIO PERF ANTE/RETRO (ADAPTER) ×3 IMPLANT
ADPR PRFSN 84XANTGRD RTRGD (ADAPTER) ×2
APPLICATOR COTTON TIP 6IN STRL (MISCELLANEOUS) IMPLANT
ATTRACTOMAT 16X20 MAGNETIC DRP (DRAPES) ×3 IMPLANT
BAG DECANTER FOR FLEXI CONT (MISCELLANEOUS) ×9 IMPLANT
BANDAGE ACE 4X5 VEL STRL LF (GAUZE/BANDAGES/DRESSINGS) ×3 IMPLANT
BANDAGE ACE 6X5 VEL STRL LF (GAUZE/BANDAGES/DRESSINGS) ×3 IMPLANT
BANDAGE ELASTIC 4 VELCRO ST LF (GAUZE/BANDAGES/DRESSINGS) ×2 IMPLANT
BANDAGE ELASTIC 6 VELCRO ST LF (GAUZE/BANDAGES/DRESSINGS) ×3 IMPLANT
BASKET HEART (ORDER IN 25'S) (MISCELLANEOUS) ×1
BASKET HEART (ORDER IN 25S) (MISCELLANEOUS) ×2 IMPLANT
BLADE CORE FAN STRYKER (BLADE) ×2 IMPLANT
BLADE OSCILLATING /SAGITTAL (BLADE) IMPLANT
BLADE STERNUM SYSTEM 6 (BLADE) ×3 IMPLANT
BLADE SURG 11 STRL SS (BLADE) ×3 IMPLANT
BLADE SURG ROTATE 9660 (MISCELLANEOUS) IMPLANT
BNDG GAUZE ELAST 4 BULKY (GAUZE/BANDAGES/DRESSINGS) ×3 IMPLANT
CANISTER SUCTION 2500CC (MISCELLANEOUS) ×4 IMPLANT
CANN PRFSN 3/8X14X24FR PCFC (MISCELLANEOUS)
CANN PRFSN 3/8XCNCT ST RT ANG (MISCELLANEOUS)
CANNULA EZ GLIDE AORTIC 21FR (CANNULA) ×3 IMPLANT
CANNULA FEM VENOUS REMOTE 22FR (CANNULA) ×2 IMPLANT
CANNULA GUNDRY RCSP 15FR (MISCELLANEOUS) ×3 IMPLANT
CANNULA PRFSN 3/8X14X24FR PCFC (MISCELLANEOUS) IMPLANT
CANNULA PRFSN 3/8XCNCT RT ANG (MISCELLANEOUS) IMPLANT
CANNULA VEN MTL TIP RT (MISCELLANEOUS)
CANNULA VENNOUS METAL TIP 20FR (CANNULA) ×3 IMPLANT
CATH CPB KIT OWEN (MISCELLANEOUS) ×3 IMPLANT
CATH ROBINSON RED A/P 18FR (CATHETERS) IMPLANT
CATH THORACIC 28FR RT ANG (CATHETERS) IMPLANT
CATH THORACIC 36FR (CATHETERS) ×3 IMPLANT
CLAMP ISOLATOR SYNERGY LG (MISCELLANEOUS) ×3 IMPLANT
CLIP FOGARTY SPRING 6M (CLIP) IMPLANT
CLIP TI MEDIUM 24 (CLIP) IMPLANT
CLIP TI WIDE RED SMALL 24 (CLIP) IMPLANT
CONN 1/2X1/2X1/2  BEN (MISCELLANEOUS) ×1
CONN 1/2X1/2X1/2 BEN (MISCELLANEOUS) ×2 IMPLANT
CONN 3/8X1/2 ST GISH (MISCELLANEOUS) ×6 IMPLANT
CONNECTOR 1/2X3/8X1/2 3 WAY (MISCELLANEOUS) ×1
CONNECTOR 1/2X3/8X1/2 3WAY (MISCELLANEOUS) ×2 IMPLANT
COVER SURGICAL LIGHT HANDLE (MISCELLANEOUS) IMPLANT
CRADLE DONUT ADULT HEAD (MISCELLANEOUS) ×3 IMPLANT
DEVICE SUT CK QUICK LOAD INDV (Prosthesis & Implant Heart) ×12 IMPLANT
DEVICE SUT CK QUICK LOAD MINI (Prosthesis & Implant Heart) ×3 IMPLANT
DRAIN CHANNEL 32F RND 10.7 FF (WOUND CARE) ×9 IMPLANT
DRAPE CARDIOVASCULAR INCISE (DRAPES)
DRAPE INCISE IOBAN 66X45 STRL (DRAPES) ×6 IMPLANT
DRAPE SLUSH/WARMER DISC (DRAPES) ×3 IMPLANT
DRAPE SRG 135X102X78XABS (DRAPES) IMPLANT
DRSG AQUACEL AG ADV 3.5X14 (GAUZE/BANDAGES/DRESSINGS) ×3 IMPLANT
DRSG COVADERM 4X14 (GAUZE/BANDAGES/DRESSINGS) ×2 IMPLANT
ELECT BLADE 4.0 EZ CLEAN MEGAD (MISCELLANEOUS) ×3
ELECT REM PT RETURN 9FT ADLT (ELECTROSURGICAL) ×6
ELECTRODE BLDE 4.0 EZ CLN MEGD (MISCELLANEOUS) ×2 IMPLANT
ELECTRODE REM PT RTRN 9FT ADLT (ELECTROSURGICAL) ×4 IMPLANT
FELT TEFLON 1X6 (MISCELLANEOUS) ×3 IMPLANT
GAUZE SPONGE 4X4 12PLY STRL (GAUZE/BANDAGES/DRESSINGS) ×6 IMPLANT
GLOVE BIO SURGEON STRL SZ 6 (GLOVE) IMPLANT
GLOVE BIO SURGEON STRL SZ 6.5 (GLOVE) ×10 IMPLANT
GLOVE BIO SURGEON STRL SZ7 (GLOVE) ×6 IMPLANT
GLOVE BIO SURGEON STRL SZ7.5 (GLOVE) IMPLANT
GLOVE ORTHO TXT STRL SZ7.5 (GLOVE) ×12 IMPLANT
GOWN STRL REUS W/ TWL LRG LVL3 (GOWN DISPOSABLE) ×16 IMPLANT
GOWN STRL REUS W/TWL LRG LVL3 (GOWN DISPOSABLE) ×24
HEMOSTAT POWDER SURGIFOAM 1G (HEMOSTASIS) ×14 IMPLANT
INSERT FOGARTY XLG (MISCELLANEOUS) ×3 IMPLANT
KIT BASIN OR (CUSTOM PROCEDURE TRAY) ×6 IMPLANT
KIT DILATOR VASC 18G NDL (KITS) ×3 IMPLANT
KIT DRAINAGE VACCUM ASSIST (KITS) ×3 IMPLANT
KIT ROOM TURNOVER OR (KITS) ×3 IMPLANT
KIT SUCTION CATH 14FR (SUCTIONS) ×9 IMPLANT
KIT SUT CK MINI COMBO 4X17 (Prosthesis & Implant Heart) ×3 IMPLANT
KIT VASOVIEW HEMOPRO VH 3000 (KITS) ×3 IMPLANT
LEAD PACING MYOCARDI (MISCELLANEOUS) ×3 IMPLANT
LINE VENT (MISCELLANEOUS) ×2 IMPLANT
LOOP VESSEL SUPERMAXI WHITE (MISCELLANEOUS) ×3 IMPLANT
MARKER GRAFT CORONARY BYPASS (MISCELLANEOUS) ×9 IMPLANT
NS IRRIG 1000ML POUR BTL (IV SOLUTION) ×18 IMPLANT
PACK OPEN HEART (CUSTOM PROCEDURE TRAY) ×6 IMPLANT
PAD ARMBOARD 7.5X6 YLW CONV (MISCELLANEOUS) ×8 IMPLANT
PAD ELECT DEFIB RADIOL ZOLL (MISCELLANEOUS) ×3 IMPLANT
PENCIL BUTTON HOLSTER BLD 10FT (ELECTRODE) ×3 IMPLANT
PROBE CRYO2-ABLATION MALLABLE (MISCELLANEOUS) ×3 IMPLANT
PUNCH AORTIC ROTATE 4.0MM (MISCELLANEOUS) IMPLANT
PUNCH AORTIC ROTATE 4.5MM 8IN (MISCELLANEOUS) IMPLANT
PUNCH AORTIC ROTATE 5MM 8IN (MISCELLANEOUS) IMPLANT
SET CARDIOPLEGIA MPS 5001102 (MISCELLANEOUS) ×3 IMPLANT
SET IRRIG TUBING LAPAROSCOPIC (IRRIGATION / IRRIGATOR) ×3 IMPLANT
SOLUTION ANTI FOG 6CC (MISCELLANEOUS) IMPLANT
SPONGE GAUZE 4X4 12PLY STER LF (GAUZE/BANDAGES/DRESSINGS) ×6 IMPLANT
SPONGE LAP 18X18 X RAY DECT (DISPOSABLE) IMPLANT
SPONGE LAP 4X18 X RAY DECT (DISPOSABLE) IMPLANT
SUCKER INTRACARDIAC WEIGHTED (SUCKER) ×3 IMPLANT
SUT BONE WAX W31G (SUTURE) ×3 IMPLANT
SUT ETHIBON 2 0 V 52N 30 (SUTURE) ×2 IMPLANT
SUT ETHIBOND 2 0 SH (SUTURE) ×9 IMPLANT
SUT ETHIBOND 2 0 SH 36X2 (SUTURE) ×2 IMPLANT
SUT ETHIBOND 2 0 V4 (SUTURE) IMPLANT
SUT ETHIBOND 2 0V4 GREEN (SUTURE) IMPLANT
SUT ETHIBOND 4 0 TF (SUTURE) IMPLANT
SUT ETHIBOND 5 0 C 1 30 (SUTURE) IMPLANT
SUT ETHIBOND X763 2 0 SH 1 (SUTURE) ×6 IMPLANT
SUT MNCRL AB 3-0 PS2 18 (SUTURE) ×8 IMPLANT
SUT MNCRL AB 4-0 PS2 18 (SUTURE) IMPLANT
SUT PDS AB 1 CTX 36 (SUTURE) ×12 IMPLANT
SUT PROLENE 2 0 SH DA (SUTURE) IMPLANT
SUT PROLENE 3 0 SH 1 (SUTURE) ×3 IMPLANT
SUT PROLENE 3 0 SH DA (SUTURE) ×6 IMPLANT
SUT PROLENE 3 0 SH1 36 (SUTURE) ×6 IMPLANT
SUT PROLENE 4 0 RB 1 (SUTURE) ×6
SUT PROLENE 4 0 SH DA (SUTURE) ×6 IMPLANT
SUT PROLENE 4-0 RB1 .5 CRCL 36 (SUTURE) ×4 IMPLANT
SUT PROLENE 5 0 C 1 36 (SUTURE) IMPLANT
SUT PROLENE 6 0 C 1 30 (SUTURE) IMPLANT
SUT PROLENE 7.0 RB 3 (SUTURE) ×9 IMPLANT
SUT PROLENE 8 0 BV175 6 (SUTURE) IMPLANT
SUT PROLENE BLUE 7 0 (SUTURE) ×3 IMPLANT
SUT PROLENE POLY MONO (SUTURE) IMPLANT
SUT SILK  1 MH (SUTURE) ×2
SUT SILK 1 MH (SUTURE) ×4 IMPLANT
SUT STEEL 6MS V (SUTURE) IMPLANT
SUT STEEL STERNAL CCS#1 18IN (SUTURE) IMPLANT
SUT STEEL SZ 6 DBL 3X14 BALL (SUTURE) IMPLANT
SUT VIC AB 1 CTX 36 (SUTURE)
SUT VIC AB 1 CTX36XBRD ANBCTR (SUTURE) IMPLANT
SUT VIC AB 2-0 CT1 27 (SUTURE) ×3
SUT VIC AB 2-0 CT1 TAPERPNT 27 (SUTURE) ×2 IMPLANT
SUT VIC AB 2-0 CTX 27 (SUTURE) IMPLANT
SUT VIC AB 3-0 SH 27 (SUTURE)
SUT VIC AB 3-0 SH 27X BRD (SUTURE) IMPLANT
SUT VIC AB 3-0 X1 27 (SUTURE) IMPLANT
SUT VICRYL 4-0 PS2 18IN ABS (SUTURE) IMPLANT
SUTURE E-PAK OPEN HEART (SUTURE) ×3 IMPLANT
SYSTEM SAHARA CHEST DRAIN ATS (WOUND CARE) ×3 IMPLANT
TAPE CLOTH SURG 4X10 WHT LF (GAUZE/BANDAGES/DRESSINGS) ×6 IMPLANT
TOWEL OR 17X24 6PK STRL BLUE (TOWEL DISPOSABLE) ×10 IMPLANT
TOWEL OR 17X26 10 PK STRL BLUE (TOWEL DISPOSABLE) ×12 IMPLANT
TRAY FOLEY IC TEMP SENS 14FR (CATHETERS) ×3 IMPLANT
TRAY FOLEY IC TEMP SENS 16FR (CATHETERS) ×6 IMPLANT
TUBING INSUFFLATION (TUBING) ×3 IMPLANT
TUBING INSUFFLATION 10FT LAP (TUBING) ×3 IMPLANT
UNDERPAD 30X30 (UNDERPADS AND DIAPERS) ×3 IMPLANT
VALVE MITRAL SZ 29 (Prosthesis & Implant Heart) ×3 IMPLANT
WATER STERILE IRR 1000ML POUR (IV SOLUTION) ×6 IMPLANT

## 2016-10-14 NOTE — Op Note (Signed)
CARDIOTHORACIC SURGERY OPERATIVE NOTE  Date of Procedure:  10/14/2016  Preoperative Diagnosis:   Severe Mitral Regurgitation  Severe Multi-vessel Coronary Artery Disease  Long-standing Persistent Atrial Fibrillation  Postoperative Diagnosis: Same  Procedure:   Mitral Valve Replacement   Redo Median Sternotomy  Medtronic Mosaic Porcine Bioprosthetic Tissue Valve (size 59mm, model #310, serial [id number])   Coronary Artery Bypass Grafting x 1   Saphenous Vein Graft to Distal Right Coronary Artery  Endoscopic Vein Harvest from Right Thigh    Maze Procedure   complete bilateral atrial lesion set using bipolar radiofrequency and cryothermy ablation    Surgeon: Valentina Gu. 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 mild mitral stenosis  Type IIIA mitral valve dysfunction  Severe bilateral atrial enlargement with extremely thin left atrial wall  Normal LV systolic function  Normal RV systolic function  Good quality SVG conduit for grafting  Good quality target vessel for grafting              BRIEF CLINICAL NOTE AND INDICATIONS FOR SURGERY  Patient is a 82 year old female with history of rheumatic fever during childhood and long-standing history of rheumatic heart disease, status post open mitral commissurotomy via left thoracotomy approach in 1970, long-standing persistent atrial fibrillation, chronic diastolic congestive heart failure, hypertension, and COPD who has been referred for surgical consultation to discuss treatment options for management of severe mitral regurgitation and mitral stenosis. The patient doesn't recall being hospitalized for rheumatic fever during childhood but she was diagnosed with it and the noted to have a heart murmur during her teenage years. She developed severe congestive heart failure and underwent open mitral  commissurotomy in 1970. Her recovery was slow and prolonged but she ultimately did remarkably well. Approximately 10 years later she developed atrial fibrillation. She underwent cardioversion at least 2 or 3 times but has remained in long-standing persistent atrial fibrillation for many years. She has been chronically anticoagulated using warfarin for at least 30 years. For a long time she was followed by [name], but since his retirement she has been followed by Dr. Oval Linsey. Over the past several years the patient has developed worsening symptoms of exertional shortness of breath and fatigue. She has a remote history of tobacco use although she quit smoking in 1977. She has been followed by Dr. [name] for COPD. She was seen in follow-up recently by Dr. Oval Linsey and follow-up echocardiogram revealed normal left ventricular systolic function with ejection fraction estimated 65-70%. Peak and mean transvalvular gradients across the mitral valve were estimated 22 and 8 mmHg respectively, corresponding to valve area estimated 1.9 cm. However, there was moderate to severe mitral regurgitation. She subsequently underwent TEE to further evaluate the severity of mitral regurgitation on 07/05/2016. This confirmed the presence of normal left ventricular systolic function. Mean transvalvular gradient across the mitral valve was estimated 5 mmHg. There was moderate to severe mitral regurgitation with EROmeasured 0.25 cm corresponding to regurgitant volume estimated 48 mL using PISA. There was severe left and right atrial enlargement with no evidence of thrombus in the left atrial appendage. Due to equivocal findings, stress echocardiography was performed 07/27/2016. With stress the mean gradient across the mitral valve increased from 6-14 mmHg, and mitral regurgitation appeared severe. In addition, the patient had some mild ST segment changes on EKG during peak stress. The patient subsequently underwent left and  right heart catheterization 08/03/2016. At catheterization the  patient had normal left ventricular systolic function with severe mitral valve stenosis, moderate multivessel coronary artery disease with tandem 80% stenosis in the right coronary artery, 50% stenosis in the left circumflex coronary artery, and only mild pulmonary hypertension. In addition, the right brachial artery appeared occluded. The patient was referred for surgical consultation.  The patient has been seen in consultation and counseled at length regarding the indications, risks and potential benefits of surgery.  All questions have been answered, and the patient provides full informed consent for the operation as described.    DETAILS OF THE OPERATIVE PROCEDURE  Preparation:  The patient is brought to the operating room on the above mentioned date and central monitoring was established by the anesthesia team including placement of Swan-Ganz catheter and radial arterial line. The patient is placed in the supine position on the operating table.  Intravenous antibiotics are administered. General endotracheal anesthesia is induced uneventfully. A Foley catheter is placed.  Baseline transesophageal echocardiogram was performed.  Findings were notable for classical rheumatic features involving the mitral valve with severe thickening and restricted leaflet mobility involving both the anterior and posterior leaflets. There was severe mitral regurgitation. There was mild mitral stenosis. Left ventricular size and systolic function appeared normal. Both the left and right atrium were severely enlarged. There was normal right ventricular size and systolic function. There was only trace tricuspid regurgitation.  The patient's chest, abdomen, both groins, and both lower extremities are prepared and draped in a sterile manner. A time out procedure is performed.   Surgical Approach and Conduit Harvest:  A redo median sternotomy incision was  performed.  The sternum was divided with a conventional sagittal saw because the patient's previous cardiac surgical procedure was performed via left thoracotomy. The pericardial sac is opened. There were dense adhesions throughout the pericardial sac related to the patient's previous surgery. Sharp dissection is utilized to free up the anterior surface of the ascending thoracic aorta. The aorta is mildly diseased with atherosclerosis.  Simultaneously, the greater saphenous vein is obtained from the patient's right thigh using endoscopic vein harvest technique.    The saphenous vein is notably good quality conduit. After removal of the saphenous vein, the small surgical incisions in the lower extremity are closed with absorbable suture.    Extracorporeal Cardiopulmonary Bypass and Myocardial Protection:  The right common femoral vein is cannulated using the Seldinger technique and a guidewire advanced into the right atrium using TEE guidance.  The patient is heparinized systemically and the femoral vein cannulated using a 22 Fr long femoral venous cannula.  The ascending aorta is cannulated for cardiopulmonary bypass.  Adequate heparinization is verified.     The entire pre-bypass portion of the operation was notable for stable hemodynamics.  Cardiopulmonary bypass was begun.  Vacuum assist venous drainage was utilized. Sharp dissection is now continued to free up the epicardial surface of the heart. Initially dissection is begun overlying the right atrium and the inferior wall of the right ventricle. Dissection is continued across the anterior surface of the right ventricle and apex. There are severe adhesions around the left lateral wall. This is avoided entirely. The left pleural space is entered and opened widely.  Attempts were made to place a retrograde cardioplegia cannula through the right atrium into the coronary sinus. The catheter will not go easily and retrograde placement is aborted.   Dissection is continued into the anterior mediastinum. The ascending aorta is dissected away from the superior vena cava and the pulmonary artery.  A second venous cannula is placed directly into the superior vena cava.   A cardioplegia cannula is placed in the ascending aorta.  A temperature probe was placed in the interventricular septum.  The patient is cooled to 32C systemic temperature.  The aortic cross clamp is applied and cold blood cardioplegia is delivered initially in an antegrade fashion through the aortic root.  Iced saline slush is applied for topical hypothermia.  The initial cardioplegic arrest is rapid with early diastolic arrest.  Repeat doses of cardioplegia are administered intermittently throughout the entire cross clamp portion of the operation through the aortic root and through the subsequently placed vein graft in order to maintain completely flat electrocardiogram and septal myocardial temperature below 15C.  Myocardial protection was felt to be excellent.   Coronary Artery Bypass Grafting:  The distal right coronary artery was grafted using a reversed saphenous vein graft in an end-to-side fashion.  At the site of distal anastomosis the target vessel was good quality and measured approximately 2.0 mm in diameter.   Maze Procedure (left atrial lesion set):  The AtriCure Synergy bipolar radiofrequency ablation clamp is used for all radiofrequency ablation lesions for the maze procedure.  The Atricure CryoICE nitrous oxide cryothermy system is utilized for all cryothermy ablation lesions.   A left atriotomy incision was performed through the interatrial groove and extended partially across the back wall of the left atrium after opening the oblique sinus inferiorly.  The floor of the left atrium and the mitral valve were exposed using a self-retaining retractor.  The left atrium was notably extremely large and thin-walled. The stump of the left atrial appendage was visualized  from within the left atrium. The stump was very short and thin-walled with scar tissue around its base from the patient's previous surgical procedure.  An ablation lesion was placed around the right sided pulmonary veins using the bipolar clamp with one limb of the clamp along the endocardial surface and one along the epicardial surface posteriorly.  A bipolar ablation lesion was placed across the dome of the left atrium from the cephalad apex of the atriotomy incision to reach the cephalad apex of the elliptical lesion around the left sided pulmonary veins.  A similar bipolar lesion was placed across the back wall of the left atrium from the caudad apex of the atriotomy incision to reach the caudad apex of the elliptical lesion around the left sided pulmonary veins.  Finally another bipolar lesion was placed across the back wall of the left atrium from the caudad apex of the atriotomy incision towards the posterior mitral valve annulus.  Cryotherapy was then utilized to complete the box surrounding the pulmonary veins on the left side with a pair of lesions that meet immediately anterior to the left inferior pulmonary vein between the vein and the stump of the left atrial appendage. This lesion set was completed along the endocardial surface onto the posterior mitral annulus with a 3 minute duration cryothermy lesion, followed by a second cryothermy lesion along the posterior epicardial surface of the left atrium to the coronary sinus.  This completes the entire left side lesion set of the Cox maze procedure.   Mitral Valve Replacement:  The mitral valve was inspected and notable for classical rheumatic features with a large V-shaped incision across the P1 segment of the posterior leaflet presumably secondary to the patient's previous open commissurotomy. There was severe fibrosis and thickening of both the anterior and posterior leaflets.    The anterior leaflet  of the mitral valve was excised sharply,  leaving a small rim of the free margin and the associated primary chords.  The posterior leaflet split in the midline and debulked.  The mitral annulus was sized to accept a 29 mm prosthesis.  The left ventricle was irrigated with copious cold saline solution.  Mitral valve replacement was performed using interrupted horizontal mattress 2-0 Ethibond pledgeted sutures with pledgets in the supraannular position.  The remaining portions of the anterior leaflet were incorporated into the suture line laterally, thereby preserving chords to both the anterior and posterior leaflet.  A Medtronic Mosaic porcine bioprosthetic tissue valve (size 29 mm, model # 310, serial # [id number]) was implanted uneventfully. The valve seated appropriately with care to position the commissure posts away from the left ventricular outflow tract.  All sutures were secured using a Cor-knot device.  Rewarming is begun.  The atriotomy was closed using a 2-layer closure of running 3-0 Prolene suture after placing a sump drain across the mitral valve to serve as a left ventricular vent.  The single proximal saphenous vein graft anastomosis was performed directly to the ascending aorta prior to removal of the aortic cross-clamp. The aortic cross clamp was removed after a total cross clamp time of 116 minutes.   Maze Procedure (right atrial lesion set):  A small oblique incision is made in the lateral wall of the right atrium.  The AtriCure Synergy bipolar radiofrequency ablation clamp is utilized to create a series of linear lesions in the right atrium, each with one limb of the clamp along the endocardial surface and the other along the epicardial surface. The first lesion is placed from the posterior apex of the atriotomy incision and along the lateral wall of the right atrium to reach the lateral aspect of the superior vena cava. A second lesion is placed in the opposite direction from the posterior apex of the atriotomy incision along the  lateral wall to reach the lateral aspect of the inferior vena cava. A third lesion is placed from the midportion of the atriotomy incision extending at a right angle to reach the tip of the right atrial appendage. A fourth lesion is placed from the anterior apex of the atriotomy incision in an anterior and inferior direction to reach the acute margin of the heart. Finally, the cryotherapy probe is utilized to complete the right atrial lesion set by placing the probe along the endocardial surface of the right atrium from the anterior apex of the atriotomy incision to reach the tricuspid annulus at the 2:00 position. The right atriotomy incision is closed with a 2 layer closure of running 4-0 Prolene suture.   Procedure Completion:  Epicardial pacing wires are fixed to the right ventricular outflow tract and to the right atrial appendage. The patient is rewarmed to 37C temperature. The aortic and left ventricular vents are removed.  The patient is weaned and disconnected from cardiopulmonary bypass.  The patient's rhythm at separation from bypass was junctional.  The patient was weaned from cardioplegic bypass without any inotropic support. Total cardiopulmonary bypass time for the operation was 166 minutes.  Followup transesophageal echocardiogram performed after separation from bypass revealed a well-seated mitral valve prosthesis that was functioning normally and without any sign of perivalvular leak.  Left ventricular function was unchanged from preoperatively.  The aortic and superior vena cava cannula were removed uneventfully. Protamine was administered to reverse the anticoagulation. The femoral venous cannula was removed and manual pressure held on the groin for 30  minutes.  The mediastinum and pleural space were inspected for hemostasis and irrigated with saline solution. The mediastinum and both pleural spaces were drained using 4 chest tubes placed through separate stab incisions inferiorly.  The  soft tissues anterior to the aorta were reapproximated loosely. The sternum is closed with double strength sternal wire. The soft tissues anterior to the sternum were closed in multiple layers and the skin is closed with a running subcuticular skin closure.  The patient received a total of 1 pack adult platelets and 2 units fresh frozen plasma due to coagulopathy and thrombocytopenia after separation from cardiopulmonary bypass and reversal of heparin with protamine. The post-bypass portion of the operation was notable for stable rhythm and hemodynamics.   Patient Disposition:  The patient tolerated the procedure well and is transported to the surgical intensive care in stable condition. There are no intraoperative complications. All sponge instrument and needle counts are verified correct at completion of the operation.    Valentina Gu. Roxy Manns MD 10/14/2016 3:40 PM

## 2016-10-14 NOTE — Progress Notes (Signed)
CRITICAL VALUE ALERT   Critical value received:  Radiology reported "tiny" pneumo on left side  Date of notification:  10/14/2016  Time of notification:  1700  Critical value read back: yes  Nurse who received alert:  Rick Duff RN  MD notified (1st page):  [phone] verbal report to [facility]

## 2016-10-14 NOTE — Anesthesia Procedure Notes (Addendum)
Central Venous Catheter Insertion Performed by: Nolon Nations, anesthesiologist Start/End1/18/2018 6:55 AM, 10/14/2016 7:15 AM Patient location: Pre-op. Preanesthetic checklist: patient identified, IV checked, site marked, risks and benefits discussed, surgical consent, monitors and equipment checked, pre-op evaluation, timeout performed and anesthesia consent Position: Trendelenburg Lidocaine 1% used for infiltration and patient sedated Hand hygiene performed , maximum sterile barriers used  and Seldinger technique used Catheter size: 9 Fr Total catheter length 10. Central line and PA cath was placed.MAC introducer Swan type:thermodilution PA Cath depth:45 Procedure performed using ultrasound guided technique. Ultrasound Notes:anatomy identified, needle tip was noted to be adjacent to the nerve/plexus identified, no ultrasound evidence of intravascular and/or intraneural injection and image(s) printed for medical record Attempts: 1 Following insertion, line sutured and dressing applied. Post procedure assessment: blood return through all ports, free fluid flow and no air  Patient tolerated the procedure well with no immediate complications.

## 2016-10-14 NOTE — Brief Op Note (Addendum)
10/14/2016  1:32 PM  PATIENT:  Catherine [name]  82 y.o. female  PRE-OPERATIVE DIAGNOSIS:  MS MR CAD AFIB  POST-OPERATIVE DIAGNOSIS:  MS MR  PROCEDURE:  Procedure(s): REDO MITRAL VALVE REPLACEMENT (MVR) (N/A) CORONARY ARTERY BYPASS GRAFTING (CABG) (N/A) MAZE (N/A) TRANSESOPHAGEAL ECHOCARDIOGRAM (TEE) (N/A) SVG-distal RCA  SURGEON:    Rexene Alberts, MD  ASSISTANTS:  John Giovanni, PA-C  ANESTHESIA:   Oleta Mouse, MD  CROSSCLAMP TIME:   38'  CARDIOPULMONARY BYPASS TIME: 166'  FINDINGS:  Rheumatic mitral valve disease with severe mitral regurgitation and mild mitral stenosis  Type IIIA mitral valve dysfunction  Severe bilateral atrial enlargement with extremely thin left atrial wall  Normal LV systolic function  Normal RV systolic function  Good quality SVG conduit for grafting  Good quality target vessel for grafting  Maze Procedure  Surgical Approach: Median sternotomy but maze lesion set done primarily endocardial due to previous surgery  Cut-and-sew:  No.  Cryo: Yes  Cryo Lesions (select all that apply):        2   Box Lesion,     4  Posterior Mirtal Annular Line,     6  Mitral Valve Cryo Lesion,     9   Intercaval Line to Tricuspid Annulus ("T" Lesion) and    10  Tricuspid Cryo Lesion, Medial   16  Other - epicardial posterior AV groove and coronary sinus    Radiofrequency:  Yes.  Bipolar: Yes.  RF Lesions (select all that apply):     9   Intercaval Line to Tricuspid Annulus ("T" Lesion),    11  Intercaval Line and   15b  RAA Lateral Wall to "T" Lesion     Left Atrial Appendage Treatment:    No    COMPLICATIONS: None  BASELINE WEIGHT: 70  PATIENT DISPOSITION:   TO SICU IN STABLE CONDITION  Rexene Alberts, MD 10/14/2016 3:30 PM

## 2016-10-14 NOTE — Interval H&P Note (Signed)
History and Physical Interval Note:  10/14/2016 8:18 AM  Catherine [name]  has presented today for surgery, with the diagnosis of MS MR CAD AFIB  The various methods of treatment have been discussed with the patient and family. After consideration of risks, benefits and other options for treatment, the patient has consented to  Procedure(s): REDO MITRAL VALVE REPLACEMENT (MVR) (N/A) CORONARY ARTERY BYPASS GRAFTING (CABG) (N/A) MAZE (N/A) TRANSESOPHAGEAL ECHOCARDIOGRAM (TEE) (N/A) as a surgical intervention .  The patient's history has been reviewed, patient examined, no change in status, stable for surgery.  I have reviewed the patient's chart and labs.  Questions were answered to the patient's satisfaction.     Rexene Alberts

## 2016-10-14 NOTE — Anesthesia Procedure Notes (Signed)
Procedure Name: Intubation Date/Time: 10/14/2016 9:54 AM Performed by: Lance Coon Pre-anesthesia Checklist: Patient identified, Emergency Drugs available, Suction available, Patient being monitored and Timeout performed Patient Re-evaluated:Patient Re-evaluated prior to inductionOxygen Delivery Method: Circle system utilized Preoxygenation: Pre-oxygenation with 100% oxygen Intubation Type: IV induction Ventilation: Mask ventilation without difficulty Laryngoscope Size: Miller and 3 Grade View: Grade II Tube type: Subglottic suction tube Tube size: 7.5 mm Number of attempts: 1 Airway Equipment and Method: Stylet Placement Confirmation: ETT inserted through vocal cords under direct vision,  positive ETCO2 and breath sounds checked- equal and bilateral Secured at: 21 cm Tube secured with: Tape Dental Injury: Teeth and Oropharynx as per pre-operative assessment

## 2016-10-14 NOTE — Progress Notes (Signed)
  Echocardiogram Echocardiogram Transesophageal has been performed.  Catherine [name] 10/14/2016, 10:55 AM

## 2016-10-14 NOTE — Transfer of Care (Signed)
Immediate Anesthesia Transfer of Care Note  Patient: MIOSHA SUITT  Procedure(s) Performed: Procedure(s): REDO MITRAL VALVE REPLACEMENT (MVR) (N/A) CORONARY ARTERY BYPASS GRAFTING (CABG) (N/A) MAZE (N/A) TRANSESOPHAGEAL ECHOCARDIOGRAM (TEE) (N/A)  Patient Location: SICU  Anesthesia Type:General  Level of Consciousness: Patient remains intubated per anesthesia plan  Airway & Oxygen Therapy: Patient remains intubated per anesthesia plan and Patient placed on Ventilator (see vital sign flow sheet for setting)  Post-op Assessment: Report given to RN, Post -op Vital signs reviewed and stable and Patient moving all extremities  Post vital signs: Reviewed and stable  Last Vitals:  Vitals:   10/14/16 0556  BP: 130/84  Pulse: 72  Resp: 20  Temp: 36.9 C    Last Pain:  Vitals:   10/14/16 0556  TempSrc: Oral         Complications: No apparent anesthesia complications

## 2016-10-14 NOTE — Progress Notes (Signed)
      [address]       [place] [address]             [phone]      S/p REdo MVR, CABG, maze  Intubated, sedated  BP 97/61   Pulse 85   Temp 98.1 F (36.7 C)   Resp 12   Ht 5\' 4"  (1.626 m)   SpO2 100%    Intake/Output Summary (Last 24 hours) at 10/14/16 1922 Last data filed at 10/14/16 1900  Gross per 24 hour  Intake          4837.58 ml  Output             2920 ml  Net          1917.58 ml   Good hemodynamics  CT with 120 out last hour- follow  Hct= 27  Doing well early postop  Catherine Lipps C. Roxan Hockey, MD [facility] [phone]

## 2016-10-14 NOTE — Anesthesia Preprocedure Evaluation (Signed)
Anesthesia Evaluation  Patient identified by MRN, date of birth, ID band Patient awake    Reviewed: Allergy & Precautions, NPO status , Patient's Chart, lab work & pertinent test results  History of Anesthesia Complications (+) PONV and history of anesthetic complications  Airway Mallampati: III  TM Distance: <3 FB Neck ROM: Full    Dental  (+) Teeth Intact   Pulmonary shortness of breath, asthma , COPD, former smoker,    breath sounds clear to auscultation       Cardiovascular hypertension, + CAD and +CHF  + Valvular Problems/Murmurs MR  Rhythm:Regular     Neuro/Psych negative neurological ROS  negative psych ROS   GI/Hepatic negative GI ROS, Neg liver ROS,   Endo/Other  negative endocrine ROS  Renal/GU Renal disease     Musculoskeletal  (+) Arthritis ,   Abdominal   Peds  Hematology negative hematology ROS (+)   Anesthesia Other Findings   Reproductive/Obstetrics                             Anesthesia Physical Anesthesia Plan  ASA: IV  Anesthesia Plan: General   Post-op Pain Management:    Induction: Intravenous  Airway Management Planned: Oral ETT  Additional Equipment: Arterial line, TEE, CVP, Ultrasound Guidance Line Placement and PA Cath  Intra-op Plan:   Post-operative Plan: Post-operative intubation/ventilation  Informed Consent: I have reviewed the patients History and Physical, chart, labs and discussed the procedure including the risks, benefits and alternatives for the proposed anesthesia with the patient or authorized representative who has indicated his/her understanding and acceptance.   Dental advisory given  Plan Discussed with: CRNA and Surgeon  Anesthesia Plan Comments:         Anesthesia Quick Evaluation

## 2016-10-14 NOTE — Progress Notes (Signed)
Patient placed back on SIMV/PRVC/PS due to decreased respiratory rate. RT will attempt to wean patient again at 2300. RN aware.

## 2016-10-15 ENCOUNTER — Encounter (HOSPITAL_COMMUNITY): Payer: Self-pay | Admitting: Thoracic Surgery (Cardiothoracic Vascular Surgery)

## 2016-10-15 ENCOUNTER — Inpatient Hospital Stay (HOSPITAL_COMMUNITY): Payer: Medicare Other

## 2016-10-15 LAB — CBC
HCT: 24.1 % — ABNORMAL LOW (ref 36.0–46.0)
HCT: 21.1 % — ABNORMAL LOW (ref 36.0–46.0)
HEMOGLOBIN: 7 g/dL — AB (ref 12.0–15.0)
Hemoglobin: 8.1 g/dL — ABNORMAL LOW (ref 12.0–15.0)
MCH: 30.9 pg (ref 26.0–34.0)
MCH: 30.7 pg (ref 26.0–34.0)
MCHC: 33.6 g/dL (ref 30.0–36.0)
MCHC: 32.7 g/dL (ref 30.0–36.0)
MCV: 92 fL (ref 78.0–100.0)
MCV: 93.8 fL (ref 78.0–100.0)
PLATELETS: 126 10*3/uL — AB (ref 150–400)
Platelets: 84 10*3/uL — ABNORMAL LOW (ref 150–400)
RBC: 2.62 MIL/uL — ABNORMAL LOW (ref 3.87–5.11)
RBC: 2.25 MIL/uL — ABNORMAL LOW (ref 3.87–5.11)
RDW: 14.5 % (ref 11.5–15.5)
RDW: 14.9 % (ref 11.5–15.5)
WBC: 12.6 10*3/uL — AB (ref 4.0–10.5)
WBC: 9.6 10*3/uL (ref 4.0–10.5)

## 2016-10-15 LAB — POCT I-STAT 3, ART BLOOD GAS (G3+)
Acid-base deficit: 1 mmol/L (ref 0.0–2.0)
BICARBONATE: 24.4 mmol/L (ref 20.0–28.0)
BICARBONATE: 26 mmol/L (ref 20.0–28.0)
Bicarbonate: 25.5 mmol/L (ref 20.0–28.0)
O2 SAT: 99 %
O2 Saturation: 99 %
O2 Saturation: 99 %
PCO2 ART: 44.8 mmHg (ref 32.0–48.0)
PCO2 ART: 48.2 mmHg — AB (ref 32.0–48.0)
PH ART: 7.344 — AB (ref 7.350–7.450)
Patient temperature: 37.8
Patient temperature: 37.6
TCO2: 26 mmol/L (ref 0–100)
TCO2: 27 mmol/L (ref 0–100)
TCO2: 27 mmol/L (ref 0–100)
pCO2 arterial: 48.7 mmHg — ABNORMAL HIGH (ref 32.0–48.0)
pH, Arterial: 7.347 — ABNORMAL LOW (ref 7.350–7.450)
pH, Arterial: 7.33 — ABNORMAL LOW (ref 7.350–7.450)
pO2, Arterial: 131 mmHg — ABNORMAL HIGH (ref 83.0–108.0)
pO2, Arterial: 166 mmHg — ABNORMAL HIGH (ref 83.0–108.0)
pO2, Arterial: 132 mmHg — ABNORMAL HIGH (ref 83.0–108.0)

## 2016-10-15 LAB — BASIC METABOLIC PANEL
ANION GAP: 6 (ref 5–15)
BUN: 8 mg/dL (ref 6–20)
CO2: 26 mmol/L (ref 22–32)
Calcium: 7.7 mg/dL — ABNORMAL LOW (ref 8.9–10.3)
Chloride: 112 mmol/L — ABNORMAL HIGH (ref 101–111)
Creatinine, Ser: 0.67 mg/dL (ref 0.44–1.00)
Glucose, Bld: 111 mg/dL — ABNORMAL HIGH (ref 65–99)
POTASSIUM: 3.7 mmol/L (ref 3.5–5.1)
SODIUM: 144 mmol/L (ref 135–145)

## 2016-10-15 LAB — PREPARE FRESH FROZEN PLASMA
BLOOD PRODUCT EXPIRATION DATE: 201801202359
Blood Product Expiration Date: 201801212359
ISSUE DATE / TIME: 201801181432
ISSUE DATE / TIME: 201801181432
Unit Type and Rh: 6200
Unit Type and Rh: 6200

## 2016-10-15 LAB — POCT I-STAT, CHEM 8
BUN: 10 mg/dL (ref 6–20)
CHLORIDE: 108 mmol/L (ref 101–111)
Calcium, Ion: 1.18 mmol/L (ref 1.15–1.40)
Creatinine, Ser: 0.8 mg/dL (ref 0.44–1.00)
Glucose, Bld: 149 mg/dL — ABNORMAL HIGH (ref 65–99)
HEMATOCRIT: 20 % — AB (ref 36.0–46.0)
Hemoglobin: 6.8 g/dL — CL (ref 12.0–15.0)
Potassium: 3.7 mmol/L (ref 3.5–5.1)
SODIUM: 143 mmol/L (ref 135–145)
TCO2: 25 mmol/L (ref 0–100)

## 2016-10-15 LAB — GLUCOSE, CAPILLARY
GLUCOSE-CAPILLARY: 102 mg/dL — AB (ref 65–99)
GLUCOSE-CAPILLARY: 119 mg/dL — AB (ref 65–99)
GLUCOSE-CAPILLARY: 119 mg/dL — AB (ref 65–99)
GLUCOSE-CAPILLARY: 126 mg/dL — AB (ref 65–99)
GLUCOSE-CAPILLARY: 153 mg/dL — AB (ref 65–99)
GLUCOSE-CAPILLARY: 157 mg/dL — AB (ref 65–99)
Glucose-Capillary: 106 mg/dL — ABNORMAL HIGH (ref 65–99)
Glucose-Capillary: 111 mg/dL — ABNORMAL HIGH (ref 65–99)
Glucose-Capillary: 116 mg/dL — ABNORMAL HIGH (ref 65–99)
Glucose-Capillary: 117 mg/dL — ABNORMAL HIGH (ref 65–99)
Glucose-Capillary: 117 mg/dL — ABNORMAL HIGH (ref 65–99)
Glucose-Capillary: 132 mg/dL — ABNORMAL HIGH (ref 65–99)

## 2016-10-15 LAB — URINALYSIS, COMPLETE (UACMP) WITH MICROSCOPIC
BACTERIA UA: NONE SEEN
BILIRUBIN URINE: NEGATIVE
Glucose, UA: NEGATIVE mg/dL
HGB URINE DIPSTICK: NEGATIVE
KETONES UR: NEGATIVE mg/dL
LEUKOCYTES UA: NEGATIVE
NITRITE: NEGATIVE
PROTEIN: 30 mg/dL — AB
RBC / HPF: NONE SEEN RBC/hpf (ref 0–5)
Specific Gravity, Urine: 1.03 (ref 1.005–1.030)
pH: 5.5 (ref 5.0–8.0)

## 2016-10-15 LAB — MAGNESIUM
MAGNESIUM: 2.7 mg/dL — AB (ref 1.7–2.4)
MAGNESIUM: 2.5 mg/dL — AB (ref 1.7–2.4)

## 2016-10-15 LAB — CREATININE, SERUM
Creatinine, Ser: 0.79 mg/dL (ref 0.44–1.00)
GFR calc Af Amer: >60 mL/min (ref >60)

## 2016-10-15 LAB — PREPARE PLATELET PHERESIS
BLOOD PRODUCT EXPIRATION DATE: 201801202359
ISSUE DATE / TIME: 201801181432
UNIT TYPE AND RH: 7300

## 2016-10-15 LAB — PREPARE RBC (CROSSMATCH)

## 2016-10-15 MED ORDER — SODIUM CHLORIDE 0.9 % IV SOLN
30.0000 meq | Freq: Once | INTRAVENOUS | Status: AC
  Administered 2016-10-15: 30 meq via INTRAVENOUS
  Filled 2016-10-15: qty 15

## 2016-10-15 MED ORDER — WARFARIN SODIUM 2.5 MG PO TABS
2.5000 mg | ORAL_TABLET | Freq: Every day | ORAL | Status: DC
Start: 2016-10-15 — End: 2016-10-19
  Administered 2016-10-15 – 2016-10-18 (×4): 2.5 mg via ORAL
  Filled 2016-10-15 (×4): qty 1

## 2016-10-15 MED ORDER — WARFARIN - PHYSICIAN DOSING INPATIENT
Freq: Every day | Status: DC
  Administered 2016-10-17 – 2016-10-21 (×3)

## 2016-10-15 MED ORDER — SODIUM CHLORIDE 0.9 % IV SOLN: Freq: Once | INTRAVENOUS | Status: DC

## 2016-10-15 MED ORDER — FUROSEMIDE 10 MG/ML IJ SOLN
20.0000 mg | Freq: Four times a day (QID) | INTRAMUSCULAR | Status: AC
  Administered 2016-10-15 – 2016-10-16 (×3): 20 mg via INTRAVENOUS
  Filled 2016-10-15 (×3): qty 2

## 2016-10-15 MED ORDER — INSULIN ASPART 100 UNIT/ML ~~LOC~~ SOLN
0.0000 [IU] | SUBCUTANEOUS | Status: DC
  Administered 2016-10-15 (×2): 2 [IU] via SUBCUTANEOUS

## 2016-10-15 MED ORDER — INSULIN DETEMIR 100 UNIT/ML ~~LOC~~ SOLN
20.0000 [IU] | Freq: Once | SUBCUTANEOUS | Status: AC
  Administered 2016-10-15: 20 [IU] via SUBCUTANEOUS
  Filled 2016-10-15: qty 0.2

## 2016-10-15 MED FILL — Mannitol IV Soln 20%: INTRAVENOUS | Qty: 500 | Status: AC

## 2016-10-15 MED FILL — Sodium Chloride IV Soln 0.9%: INTRAVENOUS | Qty: 2000 | Status: AC

## 2016-10-15 MED FILL — Magnesium Sulfate Inj 50%: INTRAMUSCULAR | Qty: 10 | Status: AC

## 2016-10-15 MED FILL — Sodium Bicarbonate IV Soln 8.4%: INTRAVENOUS | Qty: 50 | Status: AC

## 2016-10-15 MED FILL — Potassium Chloride Inj 2 mEq/ML: INTRAVENOUS | Qty: 40 | Status: AC

## 2016-10-15 MED FILL — Heparin Sodium (Porcine) Inj 1000 Unit/ML: INTRAMUSCULAR | Qty: 30 | Status: AC

## 2016-10-15 MED FILL — Heparin Sodium (Porcine) Inj 1000 Unit/ML: INTRAMUSCULAR | Qty: 10 | Status: AC

## 2016-10-15 MED FILL — Lidocaine HCl IV Inj 20 MG/ML: INTRAVENOUS | Qty: 5 | Status: AC

## 2016-10-15 MED FILL — Electrolyte-R (PH 7.4) Solution: INTRAVENOUS | Qty: 3000 | Status: AC

## 2016-10-15 NOTE — Care Management Note (Signed)
Case Management Note  Patient Details  Name: Catherine [name] MRN: [id number] Date of Birth: 07-21-1935  Subjective/Objective:     S/p MVR, CABG and MAZE               Action/Plan:  PTA independent from home with husband - husband will provide 24/7 supervision at discharge as recommended.  CM will continue to follow for discharge needs   Expected Discharge Date:                  Expected Discharge Plan:  [place]  In-House Referral:     Discharge planning Services  CM Consult  Post Acute Care Choice:    Choice offered to:     DME Arranged:    DME Agency:     HH Arranged:    HH Agency:     Status of Service:  In process, will continue to follow  If discussed at Long Length of Stay Meetings, dates discussed:    Additional Comments:  Maryclare Labrador, RN 10/15/2016, 3:16 PM

## 2016-10-15 NOTE — Progress Notes (Addendum)
T[facility] BRIEF SICU PROGRESS NOTE  1 Day Post-Op  S/P Procedure(s) (LRB): REDO MITRAL VALVE REPLACEMENT (MVR) (N/A) CORONARY ARTERY BYPASS GRAFTING (CABG) (N/A) MAZE (N/A) TRANSESOPHAGEAL ECHOCARDIOGRAM (TEE) (N/A)   Stable day Maintaining AAI paced rhythm w/ stable BP off Neo Breathing comfortably w/ O2 sats 99-100% on 2 L/min UOP adequate Hgb down to 7.0  Plan: Will start lasix and transfuse 2 units PRBCs.  Otherwise continue routine care.  Rexene Alberts, MD 10/15/2016 4:51 PM

## 2016-10-15 NOTE — Procedures (Signed)
Extubation Procedure Note  Patient Details:   Name: Catherine [name] DOB: 11/22/1934 MRN: [id number]   Airway Documentation:  Airway 7.5 mm (Active)  Secured at (cm) 21 cm 10/14/2016 11:40 PM  Measured From Lips 10/14/2016 11:40 PM  Secured Location Right 10/14/2016 11:40 PM  Secured By Pink Tape 10/14/2016 11:40 PM  Cuff Pressure (cm H2O) 26 cm H2O 10/14/2016 11:40 PM  Site Condition Dry 10/14/2016 11:40 PM    Evaluation  O2 sats: stable throughout Complications: No apparent complications Patient did tolerate procedure well. Bilateral Breath Sounds: Clear, Diminished   Yes   Patient performed NIF-50 and FVC .550. RT extubated patient to 4L [place]. Patient able to verbalize name and achieved 754ml on IS.  Patsy Baltimore Jamelia Varano 10/15/2016, 12:38 AM

## 2016-10-15 NOTE — Progress Notes (Addendum)
TCTS DAILY ICU PROGRESS NOTE                   [address] [place]            ,[place] [address]          [phone]   1 Day Post-Op Procedure(s) (LRB): REDO MITRAL VALVE REPLACEMENT (MVR) (N/A) CORONARY ARTERY BYPASS GRAFTING (CABG) (N/A) MAZE (N/A) TRANSESOPHAGEAL ECHOCARDIOGRAM (TEE) (N/A)  Total Length of Stay:  LOS: 1 day   Subjective: Some nausea, overall feels well  Objective: Vital signs in last 24 hours: Temp:  [95.4 F (35.2 C)-100 F (37.8 C)] 100 F (37.8 C) (01/19 0700) Pulse Rate:  [80-89] 86 (01/19 0700) Cardiac Rhythm: A-V Sequential paced (01/19 0400) Resp:  [9-21] 12 (01/19 0700) BP: (87-115)/(46-83) 88/46 (01/19 0700) SpO2:  [89 %-100 %] 89 % (01/19 0700) Arterial Line BP: (78-121)/(48-73) 95/55 (01/19 0700) FiO2 (%):  [40 %-50 %] 40 % (01/19 0000) Weight:  [166 lb 10.7 oz (75.6 kg)] 166 lb 10.7 oz (75.6 kg) (01/19 0300)  Filed Weights   10/15/16 0300  Weight: 166 lb 10.7 oz (75.6 kg)    Weight change:    Hemodynamic parameters for last 24 hours: PAP: (34-53)/(17-32) 48/28 CO:  [1.7 L/min-4.7 L/min] 4.7 L/min CI:  [2 L/min/m2-2.7 L/min/m2] 2.7 L/min/m2  Intake/Output from previous day: 01/18 0701 - 01/19 0700 In: 6330.5 [I.V.:4361.5; Blood:1454; IV Piggyback:515] Out: [id number] [Urine:1730; Blood:1450; Chest Tube:1050]  Intake/Output this shift: No intake/output data recorded.  Current Meds: Scheduled Meds: . acetaminophen  1,000 mg Oral Q6H  . aspirin EC  325 mg Oral Daily  . bisacodyl  10 mg Oral Daily   Or  . bisacodyl  10 mg Rectal Daily  . docusate sodium  200 mg Oral Daily  . famotidine (PEPCID) IV  20 mg Intravenous Q12H  . insulin regular  0-10 Units Intravenous TID WC  . levofloxacin (LEVAQUIN) IV  750 mg Intravenous Q24H  . metoprolol tartrate  12.5 mg Oral BID  . [START ON 10/16/2016] pantoprazole  40 mg Oral Daily  . potassium chloride (KCL MULTIRUN) 30 mEq in 265 mL IVPB  30 mEq Intravenous Once  . sodium  chloride flush  3 mL Intravenous Q12H   Continuous Infusions: . sodium chloride    . insulin (NOVOLIN-R) infusion 1.7 Units/hr (10/15/16 0700)  . nitroGLYCERIN Stopped (10/14/16 1634)  . phenylephrine (NEO-SYNEPHRINE) Adult infusion 30 mcg/min (10/15/16 0740)   PRN Meds:.albumin human, metoprolol, morphine injection, ondansetron (ZOFRAN) IV, oxyCODONE, sodium chloride flush, traMADol  General appearance: alert, cooperative and no distress Heart: regular rate and rhythm and paced, GR 60's under pacer withh 1 deg block, + rub with CT's in place, soft syst murmur Lungs: mildly dim in left >right base Abdomen: benign Extremities: minor edema Wound: dressings CDI  Lab Results: CBC: Recent Labs  10/14/16 2038 10/15/16 0400  WBC 13.4* 12.6*  HGB 9.1* 8.1*  HCT 26.7* 24.1*  PLT 129* 126*   BMET:  Recent Labs  10/12/16 1359  10/14/16 2037 10/14/16 2038 10/15/16 0400  NA 142  < > 145  --  144  K 3.4*  < > 4.3  --  3.7  CL 102  < > 112*  --  112*  CO2 27  --   --   --  26  GLUCOSE 92  < > 106*  --  111*  BUN 12  < > 11  --  8  CREATININE 0.78  < >  0.60 0.61 0.67  CALCIUM 9.8  --   --   --  7.7*  < > = values in this interval not displayed.  CMET: Lab Results  Component Value Date   WBC 12.6 (H) 10/15/2016   HGB 8.1 (L) 10/15/2016   HCT 24.1 (L) 10/15/2016   PLT 126 (L) 10/15/2016   GLUCOSE 111 (H) 10/15/2016   CHOL 217 (H) 07/12/2016   TRIG 130.0 07/12/2016   HDL 59.10 07/12/2016   LDLDIRECT 134.7 08/28/2014   LDLCALC 132 (H) 07/12/2016   ALT 28 10/12/2016   AST 34 10/12/2016   NA 144 10/15/2016   K 3.7 10/15/2016   CL 112 (H) 10/15/2016   CREATININE 0.67 10/15/2016   BUN 8 10/15/2016   CO2 26 10/15/2016   TSH 2.26 03/03/2015   INR 1.65 10/14/2016   HGBA1C 5.3 10/12/2016      PT/INR:  Recent Labs  10/14/16 1458  LABPROT 19.7*  INR 1.65   Radiology: Dg Chest Port 1 View  Result Date: 10/15/2016 CLINICAL DATA:  Mitral valve replacement. Chronic  atrial fibrillation EXAM: PORTABLE CHEST 1 VIEW COMPARISON:  October 14, 2016 FINDINGS: Endotracheal tube and nasogastric tube have been removed. Swan-Ganz catheter tip is in the main pulmonary outflow tract. There is a chest tube on the left as well as a chest tube on the right. There is a mediastinal drain. There is an apparent catheter overlying the right ventricle, apparently placed from a femoral approach. Pacemaker wires are attached to the right heart. There is no appreciable pneumothorax. There is a small left pleural effusion with left base atelectasis. The right lung is clear. Heart is mildly enlarged with pulmonary vascularity within normal limits. There is atherosclerotic calcification in the aorta. There is a mitral valve replacement as well as evidence of coronary artery bypass grafting. No bone lesions. No evident adenopathy. IMPRESSION: Tube and catheter positions as described without evident pneumothorax. Small left pleural effusion with left base atelectasis. Right lung clear. Stable cardiac silhouette. There is aortic atherosclerosis. Electronically Signed   By: Lowella Grip III M.D.   On: 10/15/2016 07:22   Dg Chest Port 1 View  Result Date: 10/14/2016 CLINICAL DATA:  Postop CABG, endotracheal tube, OG tube EXAM: PORTABLE CHEST 1 VIEW COMPARISON:  Chest x-ray dated 10/12/2016. FINDINGS: Heart size and mediastinal contours are grossly stable. Endotracheal tube is well positioned with tip approximately 4 cm above the carina. Swan-Ganz catheter in place with tip just to the left of midline. OG tube passes below the diaphragm. Mediastinal drain in place. Bilateral chest tubes in place. Pulmonary vasculature is within normal limits. Probable small left pleural effusion with adjacent atelectasis. Lungs otherwise clear. Probable tiny pneumothorax. IMPRESSION: 1. Probable tiny pneumothorax at the left lung apex. Bilateral chest tubes in place. 2. Support apparatus appears appropriately  positioned. 3. Probable small left pleural effusion with adjacent atelectasis. These results will be called to the ordering clinician or representative by the Radiologist Assistant, and communication documented in the PACS or zVision Dashboard. Electronically Signed   By: Franki Cabot M.D.   On: 10/14/2016 17:05     Assessment/Plan: S/P Procedure(s) (LRB): REDO MITRAL VALVE REPLACEMENT (MVR) (N/A) CORONARY ARTERY BYPASS GRAFTING (CABG) (N/A) MAZE (N/A) TRANSESOPHAGEAL ECHOCARDIOGRAM (TEE) (N/A)   1 progressing nicely overall, extubated without difficulty , neurologically intact 2 hemodyn stable on neo, currently apaced with 1 deg AVB 3 exp ABL anemia- folllow closely 4 renal fxn normal- good UO, will need some diuresis 5 Mod CT  output- serosang- keep tubes for now 6 sugars good control- wean insulin gtt per routine 7 routine pulm toilet and rehab  GOLD,WAYNE E 10/15/2016 7:47 AM   I have seen and examined the patient and agree with the assessment and plan as outlined.  Doing very well POD1.  Maintaining stable junctional rhythm under pacer w/ HR 50-60's.  Currently AAI pacing w/ long 1st degree AV block.  Hemodynamics stable on low dose Neo for BP support.  Breathing comfortably w/ O2 sats 93-98% on nasal cannula and CXR looks clear.  Mobilize.  Wean Neo as tolerated.  Start lasix to stimulate diuresis once BP improved off Neo.  Leave chest tubes in for now.  Watch anemia.  Restart coumadin slowly.  Rexene Alberts, MD 10/15/2016 8:34 AM

## 2016-10-15 NOTE — Progress Notes (Signed)
Patient reported having severe lower back and flank pain approximately 3/4ths til completion during blood transfusion.  Infusion stopped. Pre, 15 minute and end time vitals stable.  Remaining blood in catheter aspirated, catheter flushed and pressure cap changed. NS line initiated to maintain site access per blood tag protocol.  Blood capped and reserved for return to blood bank.  Charge notified; blood bank notified; MD notified.  MD assessment is "that is not a transfusion reaction, finish the unit and give the second unit and disregard back pain as a sign of a transfusion reaction."  Verbal order given to continue to blood if still available and to administer second unit when available.  Suspected blood sent down to blood bank; attempted to retrieve second unit; blood bank will not dispense second unit until transfusion reaction has been ruled out.  Will continue to monitor.  Clyda Hurdle RN

## 2016-10-16 ENCOUNTER — Inpatient Hospital Stay (HOSPITAL_COMMUNITY): Payer: Medicare Other

## 2016-10-16 ENCOUNTER — Encounter (HOSPITAL_COMMUNITY): Payer: Self-pay | Admitting: Thoracic Surgery (Cardiothoracic Vascular Surgery)

## 2016-10-16 LAB — BASIC METABOLIC PANEL
Anion gap: 6 (ref 5–15)
BUN: 10 mg/dL (ref 6–20)
CALCIUM: 8 mg/dL — AB (ref 8.9–10.3)
CO2: 26 mmol/L (ref 22–32)
Chloride: 109 mmol/L (ref 101–111)
Creatinine, Ser: 0.74 mg/dL (ref 0.44–1.00)
GFR calc Af Amer: >60 mL/min (ref >60)
GLUCOSE: 114 mg/dL — AB (ref 65–99)
Potassium: 3.9 mmol/L (ref 3.5–5.1)
Sodium: 141 mmol/L (ref 135–145)

## 2016-10-16 LAB — GLUCOSE, CAPILLARY
GLUCOSE-CAPILLARY: 138 mg/dL — AB (ref 65–99)
Glucose-Capillary: 106 mg/dL — ABNORMAL HIGH (ref 65–99)
Glucose-Capillary: 107 mg/dL — ABNORMAL HIGH (ref 65–99)
Glucose-Capillary: 108 mg/dL — ABNORMAL HIGH (ref 65–99)
Glucose-Capillary: 117 mg/dL — ABNORMAL HIGH (ref 65–99)

## 2016-10-16 LAB — CBC
HCT: 26.8 % — ABNORMAL LOW (ref 36.0–46.0)
Hemoglobin: 9 g/dL — ABNORMAL LOW (ref 12.0–15.0)
MCH: 31 pg (ref 26.0–34.0)
MCHC: 33.6 g/dL (ref 30.0–36.0)
MCV: 92.4 fL (ref 78.0–100.0)
Platelets: 75 10*3/uL — ABNORMAL LOW (ref 150–400)
RBC: 2.9 MIL/uL — ABNORMAL LOW (ref 3.87–5.11)
RDW: 14.8 % (ref 11.5–15.5)
WBC: 10.3 10*3/uL (ref 4.0–10.5)

## 2016-10-16 LAB — PROTIME-INR
INR: 1.31
PROTHROMBIN TIME: 16.4 s — AB (ref 11.4–15.2)

## 2016-10-16 MED ORDER — MOVING RIGHT ALONG BOOK
Freq: Once | Status: AC
  Administered 2016-10-16: 10:00:00
  Filled 2016-10-16: qty 1

## 2016-10-16 MED ORDER — DIAZEPAM 5 MG PO TABS
5.0000 mg | ORAL_TABLET | Freq: Three times a day (TID) | ORAL | Status: DC | PRN
  Administered 2016-10-16 – 2016-10-21 (×7): 5 mg via ORAL
  Filled 2016-10-16 (×7): qty 1

## 2016-10-16 MED ORDER — POTASSIUM CHLORIDE CRYS ER 20 MEQ PO TBCR
20.0000 meq | EXTENDED_RELEASE_TABLET | Freq: Every day | ORAL | Status: DC
  Administered 2016-10-17 – 2016-10-18 (×2): 20 meq via ORAL
  Filled 2016-10-16 (×2): qty 1

## 2016-10-16 MED ORDER — FUROSEMIDE 10 MG/ML IJ SOLN
40.0000 mg | Freq: Three times a day (TID) | INTRAMUSCULAR | Status: DC
  Administered 2016-10-16 – 2016-10-17 (×4): 40 mg via INTRAVENOUS
  Filled 2016-10-16 (×4): qty 4

## 2016-10-16 MED ORDER — SODIUM CHLORIDE 0.9 % IV SOLN: 250.0000 mL | INTRAVENOUS | Status: DC | PRN

## 2016-10-16 MED ORDER — SODIUM CHLORIDE 0.9% FLUSH
3.0000 mL | Freq: Two times a day (BID) | INTRAVENOUS | Status: DC
  Administered 2016-10-16 – 2016-10-21 (×5): 3 mL via INTRAVENOUS

## 2016-10-16 MED ORDER — SODIUM CHLORIDE 0.9% FLUSH: 3.0000 mL | INTRAVENOUS | Status: DC | PRN

## 2016-10-16 MED ORDER — SODIUM CHLORIDE 0.9 % IV SOLN
30.0000 meq | Freq: Once | INTRAVENOUS | Status: AC
  Administered 2016-10-16: 30 meq via INTRAVENOUS
  Filled 2016-10-16: qty 15

## 2016-10-16 MED ORDER — IPRATROPIUM-ALBUTEROL 0.5-2.5 (3) MG/3ML IN SOLN
3.0000 mL | Freq: Four times a day (QID) | RESPIRATORY_TRACT | Status: DC | PRN
  Administered 2016-10-19: 3 mL via RESPIRATORY_TRACT
  Filled 2016-10-16: qty 3

## 2016-10-16 MED ORDER — FUROSEMIDE 10 MG/ML IJ SOLN: 20.0000 mg | Freq: Four times a day (QID) | INTRAMUSCULAR | Status: DC

## 2016-10-16 MED ORDER — FUROSEMIDE 40 MG PO TABS
40.0000 mg | ORAL_TABLET | Freq: Two times a day (BID) | ORAL | Status: DC
  Administered 2016-10-17: 40 mg via ORAL
  Filled 2016-10-16: qty 1

## 2016-10-16 NOTE — Progress Notes (Signed)
[address]       [place] [address]             [phone]        CARDIOTHORACIC SURGERY PROGRESS NOTE   R2 Days Post-Op Procedure(s) (LRB): REDO MITRAL VALVE REPLACEMENT (MVR) (N/A) CORONARY ARTERY BYPASS GRAFTING (CABG) (N/A) MAZE (N/A) TRANSESOPHAGEAL ECHOCARDIOGRAM (TEE) (N/A)  Subjective: Didn't sleep at all last night.  Expected soreness in chest.  Some anxiety.  Poor appetite but nausea improved.  Denies SOB  Objective: Vital signs: BP Readings from Last 1 Encounters:  10/16/16 (!) 119/57   Pulse Readings from Last 1 Encounters:  10/16/16 80   Resp Readings from Last 1 Encounters:  10/16/16 17   Temp Readings from Last 1 Encounters:  10/16/16 98 F (36.7 C) (Oral)    Hemodynamics: PAP: (41)/(19) 41/19  Physical Exam:  Rhythm:   Junctional 60-70   Breath sounds: Few bibasilar crackles  Heart sounds:  RRR  Incisions:  Dressings dry, intact  Abdomen:  Soft, non-distended, non-tender  Extremities:  Warm, well-perfused  Chest tubes:  low volume thin serosanguinous output, no air leak    Intake/Output from previous day: 01/19 0701 - 01/20 0700 In: 1330.4 [P.O.:250; I.V.:110; Blood:705.3; IV Piggyback:265] Out: 1440 [Urine:1000; Chest Tube:440] Intake/Output this shift: No intake/output data recorded.  Lab Results:  CBC: Recent Labs  10/15/16 1720 10/15/16 1742 10/16/16 0331  WBC 9.6  --  10.3  HGB 7.0* 6.8* 9.0*  HCT 21.1* 20.0* 26.8*  PLT 84*  --  75*    BMET:  Recent Labs  10/15/16 0400  10/15/16 1742 10/16/16 0331  NA 144  --  143 141  K 3.7  --  3.7 3.9  CL 112*  --  108 109  CO2 26  --   --  26  GLUCOSE 111*  --  149* 114*  BUN 8  --  10 10  CREATININE 0.67  < > 0.80 0.74  CALCIUM 7.7*  --   --  8.0*  < > = values in this interval not displayed.   PT/INR:   Recent Labs  10/16/16 0331  LABPROT 16.4*  INR 1.31    CBG (last 3)   Recent Labs  10/15/16 2334 10/16/16 0327 10/16/16 0851    GLUCAP 119* 106* 107*    ABG    Component Value Date/Time   PHART 7.330 (L) 10/15/2016 0406   PCO2ART 48.7 (H) 10/15/2016 0406   PO2ART 132.0 (H) 10/15/2016 0406   HCO3 25.5 10/15/2016 0406   TCO2 25 10/15/2016 1742   ACIDBASEDEF 1.0 10/15/2016 0134   O2SAT 99.0 10/15/2016 0406    CXR: PORTABLE CHEST 1 VIEW  COMPARISON:  Yesterday at [id number] hour  FINDINGS: Swan-Ganz catheter has been removed, and the right internal jugular sheath remains. Bilateral chest tube and mediastinal drain. Catheter from an inferior approach projects over the left ventricle. Unchanged heart size and mediastinal contours. Prosthetic valve is not well visualized. Small left pleural effusion is unchanged from prior exam. Mild associated left lung base atelectasis is improving. No pneumothorax. Right lung is clear.  IMPRESSION: Improving left lung base atelectasis. Left pleural effusion is unchanged.  Removal of Swan-Ganz catheter. Remaining support apparatus are unchanged.   Electronically Signed   By: Jeb Levering M.D.   On: 10/16/2016 06:34   Assessment/Plan: S/P Procedure(s) (LRB): REDO MITRAL VALVE REPLACEMENT (MVR) (N/A) CORONARY ARTERY BYPASS GRAFTING (CABG) (N/A) MAZE (N/A) TRANSESOPHAGEAL ECHOCARDIOGRAM (TEE) (N/A)  Overall  doing well POD2 Maintaining stable rhythm (junctional 60-70) and BP off all drips Breathing comfortably w/ O2 sats 99-100% on 2 L/min via [place] Expected post op acute blood loss anemia, Hgb up 9.0 after transfusion Chronic diastolic CHF with expected post-op volume excess, I/O's balanced yesterday and weight 12 lbs > preop Post op thrombocytopenia, platelet count down slightly 75k COPD Chronic anxiety   Turn pacer to backup  Mobilize  D/C chest tubes  Diuresis  Avoid heparin/lovenox and watch platelet count  Restart Valium as needed   Rexene Alberts, MD 10/16/2016 9:14 AM

## 2016-10-16 NOTE — Progress Notes (Signed)
Patient coughing and reports having difficulty breathing.  VSS; lungs rhonchus bilaterally, productive cough with clear mildly pink tinged sputum.  Patient repositioned, AM lasix given early; MD notified.  MD will address issue in AM.  On reassessment lungs sounds are less rhonchus and patient reports difficulty breathing has resolved. VSS.  Will continue to monitor.  Clyda Hurdle RN

## 2016-10-17 ENCOUNTER — Inpatient Hospital Stay (HOSPITAL_COMMUNITY): Payer: Medicare Other

## 2016-10-17 LAB — BASIC METABOLIC PANEL
Anion gap: 6 (ref 5–15)
Anion gap: 5 (ref 5–15)
BUN: 11 mg/dL (ref 6–20)
BUN: 13 mg/dL (ref 6–20)
CALCIUM: 8.3 mg/dL — AB (ref 8.9–10.3)
CHLORIDE: 107 mmol/L (ref 101–111)
CHLORIDE: 110 mmol/L (ref 101–111)
CO2: 29 mmol/L (ref 22–32)
CO2: 26 mmol/L (ref 22–32)
CREATININE: 0.77 mg/dL (ref 0.44–1.00)
CREATININE: 0.65 mg/dL (ref 0.44–1.00)
Calcium: 7.9 mg/dL — ABNORMAL LOW (ref 8.9–10.3)
GFR calc Af Amer: >60 mL/min (ref >60)
GFR calc Af Amer: >60 mL/min (ref >60)
GFR calc non Af Amer: >60 mL/min (ref >60)
Glucose, Bld: 105 mg/dL — ABNORMAL HIGH (ref 65–99)
Glucose, Bld: 140 mg/dL — ABNORMAL HIGH (ref 65–99)
Potassium: 3.1 mmol/L — ABNORMAL LOW (ref 3.5–5.1)
Potassium: 4.4 mmol/L (ref 3.5–5.1)
SODIUM: 142 mmol/L (ref 135–145)
Sodium: 141 mmol/L (ref 135–145)

## 2016-10-17 LAB — CBC
HCT: 28.1 % — ABNORMAL LOW (ref 36.0–46.0)
Hemoglobin: 9.6 g/dL — ABNORMAL LOW (ref 12.0–15.0)
MCH: 31.2 pg (ref 26.0–34.0)
MCHC: 34.2 g/dL (ref 30.0–36.0)
MCV: 91.2 fL (ref 78.0–100.0)
PLATELETS: 91 10*3/uL — AB (ref 150–400)
RBC: 3.08 MIL/uL — ABNORMAL LOW (ref 3.87–5.11)
RDW: 14.7 % (ref 11.5–15.5)
WBC: 10.2 10*3/uL (ref 4.0–10.5)

## 2016-10-17 LAB — PROTIME-INR
INR: 1.36
PROTHROMBIN TIME: 16.9 s — AB (ref 11.4–15.2)

## 2016-10-17 LAB — MAGNESIUM: MAGNESIUM: 2 mg/dL (ref 1.7–2.4)

## 2016-10-17 MED ORDER — SODIUM CHLORIDE 0.9 % IV SOLN
30.0000 meq | Freq: Once | INTRAVENOUS | Status: AC
  Administered 2016-10-17: 30 meq via INTRAVENOUS
  Filled 2016-10-17: qty 15

## 2016-10-17 MED ORDER — METOCLOPRAMIDE HCL 5 MG/ML IJ SOLN
10.0000 mg | Freq: Four times a day (QID) | INTRAMUSCULAR | Status: AC
  Administered 2016-10-17 – 2016-10-18 (×4): 10 mg via INTRAVENOUS
  Filled 2016-10-17 (×4): qty 2

## 2016-10-17 MED ORDER — SODIUM CHLORIDE 0.9 % IV SOLN
30.0000 meq | Freq: Once | INTRAVENOUS | Status: AC
  Administered 2016-10-17: 30 meq via INTRAVENOUS
  Filled 2016-10-17 (×2): qty 15

## 2016-10-17 MED ORDER — ASPIRIN EC 81 MG PO TBEC
81.0000 mg | DELAYED_RELEASE_TABLET | Freq: Every day | ORAL | Status: DC
  Administered 2016-10-18 – 2016-10-22 (×5): 81 mg via ORAL
  Filled 2016-10-17 (×5): qty 1

## 2016-10-17 MED ORDER — FUROSEMIDE 40 MG PO TABS
40.0000 mg | ORAL_TABLET | Freq: Two times a day (BID) | ORAL | Status: DC
  Administered 2016-10-18: 40 mg via ORAL
  Filled 2016-10-17: qty 1

## 2016-10-17 MED ORDER — FUROSEMIDE 10 MG/ML IJ SOLN
40.0000 mg | Freq: Once | INTRAMUSCULAR | Status: AC
  Administered 2016-10-17: 40 mg via INTRAVENOUS
  Filled 2016-10-17: qty 4

## 2016-10-17 NOTE — Progress Notes (Addendum)
[address]       [place] [address]             [phone]        CARDIOTHORACIC SURGERY PROGRESS NOTE   R3 Days Post-Op Procedure(s) (LRB): REDO MITRAL VALVE REPLACEMENT (MVR) (N/A) CORONARY ARTERY BYPASS GRAFTING (CABG) (N/A) MAZE (N/A) TRANSESOPHAGEAL ECHOCARDIOGRAM (TEE) (N/A)  Subjective: Looks good and reports feeling some better.  Slept well.  Still weak and dyspneic with ambulation.  Marginal appetite.  Objective: Vital signs: BP Readings from Last 1 Encounters:  10/17/16 (!) 130/56   Pulse Readings from Last 1 Encounters:  10/17/16 67   Resp Readings from Last 1 Encounters:  10/17/16 13   Temp Readings from Last 1 Encounters:  10/17/16 98.5 F (36.9 C) (Oral)    Hemodynamics:    Physical Exam:  Rhythm:   Junctional w/ HR 60-70 stable off pacer  Breath sounds: clear  Heart sounds:  RRR  Incisions:  Clean and dry  Abdomen:  Soft, non-distended, non-tender  Extremities:  Warm, well-perfused    Intake/Output from previous day: 01/20 0701 - 01/21 0700 In: 1140 [P.O.:610; IV Piggyback:530] Out: 2155 [Urine:2125; Chest Tube:30] Intake/Output this shift: Total I/O In: -  Out: 200 [Urine:200]  Lab Results:  CBC: Recent Labs  10/16/16 0331 10/17/16 0246  WBC 10.3 10.2  HGB 9.0* 9.6*  HCT 26.8* 28.1*  PLT 75* 91*    BMET:  Recent Labs  10/16/16 0331 10/17/16 0246  NA 141 142  K 3.9 3.1*  CL 109 107  CO2 26 29  GLUCOSE 114* 105*  BUN 10 11  CREATININE 0.74 0.77  CALCIUM 8.0* 8.3*     PT/INR:   Recent Labs  10/17/16 0246  LABPROT 16.9*  INR 1.36    CBG (last 3)   Recent Labs  10/16/16 1255 10/16/16 1632 10/16/16 1924  GLUCAP 108* 117* 138*    ABG    Component Value Date/Time   PHART 7.330 (L) 10/15/2016 0406   PCO2ART 48.7 (H) 10/15/2016 0406   PO2ART 132.0 (H) 10/15/2016 0406   HCO3 25.5 10/15/2016 0406   TCO2 25 10/15/2016 1742   ACIDBASEDEF 1.0 10/15/2016 0134   O2SAT 99.0  10/15/2016 0406    CXR: PORTABLE CHEST 1 VIEW  COMPARISON:  October 16, 2016  FINDINGS: Stable cardiomegaly. The pleural effusion and underlying opacity in the left base are stable. No overt edema. The pleural effusion may demonstrate an air-fluid level suggesting the possibility of a tiny amount of air in the pleural space as well. This is stable. No other change.  IMPRESSION: 1. Stable left pleural effusion and underlying atelectasis. There may be a small amount of air in the left pleural space with an air-fluid level at the base. These findings are all unchanged. No edema. Support apparatus has been removed.   Electronically Signed   By: Dorise Bullion III M.D   On: 10/17/2016 07:25  Assessment/Plan: S/P Procedure(s) (LRB): REDO MITRAL VALVE REPLACEMENT (MVR) (N/A) CORONARY ARTERY BYPASS GRAFTING (CABG) (N/A) MAZE (N/A) TRANSESOPHAGEAL ECHOCARDIOGRAM (TEE) (N/A)  Overall doing well POD3 Maintaining stable junctional rhythm and BP, pacer off Breathing comfortably w/ O2 sats 99-100% on 2 L/min via [place] Expected post op acute blood loss anemia, Hgb up 9.6  Acute on chronic diastolic CHF with expected post-op volume excess, I/O's negative 1 liter yesterday but weight up further 15 lbs > preop Post op thrombocytopenia, platelet count up slightly 91k COPD Chronic anxiety  Mobilize  Diuresis - will give lasix IV today  Continue to hold beta blockers and amiodarone for now  Avoid heparin/lovenox and watch platelet count  Coumadin  Transfer step down  Rexene Alberts, MD 10/17/2016 9:42 AM

## 2016-10-18 LAB — TYPE AND SCREEN
ABO/RH(D): O POS
Antibody Screen: NEGATIVE
UNIT DIVISION: 0
UNIT DIVISION: 0
Unit division: 0
Unit division: 0

## 2016-10-18 LAB — PROTIME-INR
INR: 1.6
Prothrombin Time: 19.3 seconds — ABNORMAL HIGH (ref 11.4–15.2)

## 2016-10-18 LAB — ECHO TEE
Ao-asc: 3 cm
CHL CUP DOP CALC LVOT VTI: 25.2 cm
CHL CUP MV M VEL: 92
LVOT MV VTI INDEX: 1.1 cm2/m2
LVOT MV VTI: 1.93
LVOT SV: 64 mL
LVOT area: 2.54 cm2
LVOT diameter: 18 mm
LVOTPV: 108 cm/s
MVANNULUSVTI: 33.1 cm
MVG: 6 mmHg
STJ: 2.3 cm
Sinus: 3.1 cm

## 2016-10-18 LAB — BASIC METABOLIC PANEL
Anion gap: 9 (ref 5–15)
BUN: 11 mg/dL (ref 6–20)
CHLORIDE: 105 mmol/L (ref 101–111)
CO2: 28 mmol/L (ref 22–32)
Calcium: 8.2 mg/dL — ABNORMAL LOW (ref 8.9–10.3)
Creatinine, Ser: 0.67 mg/dL (ref 0.44–1.00)
GFR calc Af Amer: >60 mL/min (ref >60)
GFR calc non Af Amer: >60 mL/min (ref >60)
Glucose, Bld: 112 mg/dL — ABNORMAL HIGH (ref 65–99)
POTASSIUM: 3.3 mmol/L — AB (ref 3.5–5.1)
Sodium: 142 mmol/L (ref 135–145)

## 2016-10-18 LAB — TRANSFUSION REACTION
DAT C3: NEGATIVE
Post RXN DAT IgG: NEGATIVE

## 2016-10-18 LAB — CBC
HEMATOCRIT: 25.3 % — AB (ref 36.0–46.0)
HEMOGLOBIN: 8.6 g/dL — AB (ref 12.0–15.0)
MCH: 30.8 pg (ref 26.0–34.0)
MCHC: 34 g/dL (ref 30.0–36.0)
MCV: 90.7 fL (ref 78.0–100.0)
Platelets: 101 10*3/uL — ABNORMAL LOW (ref 150–400)
RBC: 2.79 MIL/uL — ABNORMAL LOW (ref 3.87–5.11)
RDW: 14.2 % (ref 11.5–15.5)
WBC: 7.5 10*3/uL (ref 4.0–10.5)

## 2016-10-18 MED ORDER — POTASSIUM CHLORIDE CRYS ER 10 MEQ PO TBCR
20.0000 meq | EXTENDED_RELEASE_TABLET | Freq: Two times a day (BID) | ORAL | Status: DC
  Administered 2016-10-18 (×2): 20 meq via ORAL
  Filled 2016-10-18 (×4): qty 2

## 2016-10-18 MED ORDER — EZETIMIBE 10 MG PO TABS
10.0000 mg | ORAL_TABLET | Freq: Every day | ORAL | Status: DC
  Administered 2016-10-19 – 2016-10-22 (×4): 10 mg via ORAL
  Filled 2016-10-18 (×4): qty 1

## 2016-10-18 MED ORDER — SODIUM CHLORIDE 0.9 % IV SOLN
30.0000 meq | Freq: Once | INTRAVENOUS | Status: AC
  Administered 2016-10-18: 30 meq via INTRAVENOUS
  Filled 2016-10-18: qty 15

## 2016-10-18 MED ORDER — MOMETASONE FURO-FORMOTEROL FUM 100-5 MCG/ACT IN AERO
2.0000 | INHALATION_SPRAY | Freq: Two times a day (BID) | RESPIRATORY_TRACT | Status: DC
  Administered 2016-10-19 – 2016-10-21 (×5): 2 via RESPIRATORY_TRACT
  Filled 2016-10-18: qty 8.8

## 2016-10-18 MED ORDER — POTASSIUM CHLORIDE CRYS ER 20 MEQ PO TBCR: 40.0000 meq | EXTENDED_RELEASE_TABLET | Freq: Once | ORAL | Status: AC

## 2016-10-18 MED ORDER — ROSUVASTATIN CALCIUM 10 MG PO TABS
5.0000 mg | ORAL_TABLET | Freq: Every day | ORAL | Status: DC
  Administered 2016-10-19 – 2016-10-21 (×3): 5 mg via ORAL
  Filled 2016-10-18 (×3): qty 1

## 2016-10-18 MED ORDER — FA-PYRIDOXINE-CYANOCOBALAMIN 2.5-25-2 MG PO TABS
1.0000 | ORAL_TABLET | Freq: Every day | ORAL | Status: DC
  Administered 2016-10-18 – 2016-10-22 (×5): 1 via ORAL
  Filled 2016-10-18 (×5): qty 1

## 2016-10-18 MED ORDER — POLYSACCHARIDE IRON COMPLEX 150 MG PO CAPS
150.0000 mg | ORAL_CAPSULE | Freq: Every day | ORAL | Status: DC
  Administered 2016-10-19 – 2016-10-22 (×4): 150 mg via ORAL
  Filled 2016-10-18 (×4): qty 1

## 2016-10-18 MED ORDER — ATENOLOL 25 MG PO TABS
12.5000 mg | ORAL_TABLET | Freq: Every day | ORAL | Status: DC
  Administered 2016-10-18 – 2016-10-22 (×5): 12.5 mg via ORAL
  Filled 2016-10-18 (×5): qty 1

## 2016-10-18 MED ORDER — FUROSEMIDE 40 MG PO TABS
40.0000 mg | ORAL_TABLET | Freq: Two times a day (BID) | ORAL | Status: DC
  Administered 2016-10-18 – 2016-10-19 (×3): 40 mg via ORAL
  Filled 2016-10-18 (×3): qty 1

## 2016-10-18 NOTE — Progress Notes (Signed)
Pt transferred to 2w21 from [place]. Pt oriented to room. VSS. Placed on tele. Husband at bedside. Phone and call bell within reach. Will continue to monitor.

## 2016-10-18 NOTE — Anesthesia Postprocedure Evaluation (Addendum)
Anesthesia Post Note  Patient: Catherine [name]  Procedure(s) Performed: Procedure(s) (LRB): REDO MITRAL VALVE REPLACEMENT (MVR) (N/A) CORONARY ARTERY BYPASS GRAFTING (CABG) (N/A) MAZE (N/A) TRANSESOPHAGEAL ECHOCARDIOGRAM (TEE) (N/A)  Patient location during evaluation: ICU Anesthesia Type: General Level of consciousness: sedated Pain management: pain level controlled Vital Signs Assessment: post-procedure vital signs reviewed and stable Respiratory status: patient remains intubated per anesthesia plan Cardiovascular status: stable Anesthetic complications: no       Last Vitals:  Vitals:   10/18/16 0700 10/18/16 1100  BP: 127/61   Pulse: 85   Resp: 18   Temp: 36.9 C 36.8 C    Last Pain:  Vitals:   10/18/16 1100  TempSrc: Oral  PainSc:                  Carliyah Cotterman

## 2016-10-18 NOTE — Progress Notes (Addendum)
      [address]       [place] [address]             [phone]        CARDIOTHORACIC SURGERY PROGRESS NOTE   R4 Days Post-Op Procedure(s) (LRB): REDO MITRAL VALVE REPLACEMENT (MVR) (N/A) CORONARY ARTERY BYPASS GRAFTING (CABG) (N/A) MAZE (N/A) TRANSESOPHAGEAL ECHOCARDIOGRAM (TEE) (N/A)  Subjective: Feels anxious but no specific complaints.  Slept well.  Mild soreness in chest.  Dyspnea with activity but breathing comfortably on room air.  Appetite slightly improved.  Objective: Vital signs: BP Readings from Last 1 Encounters:  10/18/16 127/61   Pulse Readings from Last 1 Encounters:  10/18/16 85   Resp Readings from Last 1 Encounters:  10/18/16 18   Temp Readings from Last 1 Encounters:  10/18/16 98.4 F (36.9 C) (Oral)    Hemodynamics:    Physical Exam:  Rhythm:   junctional  Breath sounds: Diminished at bases, otherwise clear  Heart sounds:  RRR  Incisions:  Clean and dry  Abdomen:  Soft, non-distended, non-tender  Extremities:  Warm, well-perfused    Intake/Output from previous day: 01/21 0701 - 01/22 0700 In: [id number] [P.O.:690; IV Piggyback:551] Out: 1200 [Urine:1200] Intake/Output this shift: No intake/output data recorded.  Lab Results:  CBC: Recent Labs  10/17/16 0246 10/18/16 0308  WBC 10.2 7.5  HGB 9.6* 8.6*  HCT 28.1* 25.3*  PLT 91* 101*    BMET:  Recent Labs  10/17/16 1513 10/18/16 0308  NA 141 142  K 4.4 3.3*  CL 110 105  CO2 26 28  GLUCOSE 140* 112*  BUN 13 11  CREATININE 0.65 0.67  CALCIUM 7.9* 8.2*     PT/INR:   Recent Labs  10/18/16 0308  LABPROT 19.3*  INR 1.60    CBG (last 3)   Recent Labs  10/16/16 1255 10/16/16 1632 10/16/16 1924  GLUCAP 108* 117* 138*    ABG    Component Value Date/Time   PHART 7.330 (L) 10/15/2016 0406   PCO2ART 48.7 (H) 10/15/2016 0406   PO2ART 132.0 (H) 10/15/2016 0406   HCO3 25.5 10/15/2016 0406   TCO2 25 10/15/2016 1742   ACIDBASEDEF 1.0 10/15/2016  0134   O2SAT 99.0 10/15/2016 0406    CXR: n/a  Assessment/Plan: S/P Procedure(s) (LRB): REDO MITRAL VALVE REPLACEMENT (MVR) (N/A) CORONARY ARTERY BYPASS GRAFTING (CABG) (N/A) MAZE (N/A) TRANSESOPHAGEAL ECHOCARDIOGRAM (TEE) (N/A)  Overall doing well POD4 Maintaining stable rhythm and BP  Breathing comfortably w/ O2 sats 93-100% on room air Expected post op acute blood loss anemia, Hgb down slightly 8.6  Acute on chronic diastolic CHF with expected post-op volume excess, I/O's balanced yesterday and weight still 15 lbs > preop Post op thrombocytopenia, platelet count up 101k Hypokalemia, induced by loop diuretics COPD Severe chronic anxiety   Mobilize  Diuresis  Restart Atenolol  D/C pacing wires before INR rises any further  Supplement potassium  Continue Valium as needed  Coumadin  Still awaiting bed on step down for transfer  Rexene Alberts, MD 10/18/2016 8:53 AM

## 2016-10-18 NOTE — Progress Notes (Signed)
EPW d/c'd per order and per protocol. Tips intact pt tolerated well. Gauze applied to pts CT suture sites. Small amount or serosanguinous drainage noted. Pt educated on bedrest for 1 hour and vitals q39m.

## 2016-10-18 NOTE — Progress Notes (Signed)
CCMD notified RN pt had 2 5 beat runs of v-tach and a 2.2 sec pause. Pt asymptomatic.  MD made aware. Will continue to monitor.

## 2016-10-19 LAB — BASIC METABOLIC PANEL
ANION GAP: 5 (ref 5–15)
BUN: 11 mg/dL (ref 6–20)
CHLORIDE: 106 mmol/L (ref 101–111)
CO2: 28 mmol/L (ref 22–32)
Calcium: 8.4 mg/dL — ABNORMAL LOW (ref 8.9–10.3)
Creatinine, Ser: 0.65 mg/dL (ref 0.44–1.00)
GFR calc Af Amer: >60 mL/min (ref >60)
Glucose, Bld: 112 mg/dL — ABNORMAL HIGH (ref 65–99)
POTASSIUM: 3.5 mmol/L (ref 3.5–5.1)
SODIUM: 139 mmol/L (ref 135–145)

## 2016-10-19 LAB — PROTIME-INR
INR: 1.85
Prothrombin Time: 21.6 seconds — ABNORMAL HIGH (ref 11.4–15.2)

## 2016-10-19 MED ORDER — POTASSIUM CHLORIDE CRYS ER 20 MEQ PO TBCR
30.0000 meq | EXTENDED_RELEASE_TABLET | Freq: Two times a day (BID) | ORAL | Status: DC
  Administered 2016-10-19: 30 meq via ORAL
  Filled 2016-10-19: qty 1

## 2016-10-19 MED ORDER — MAGNESIUM OXIDE 400 (241.3 MG) MG PO TABS
200.0000 mg | ORAL_TABLET | Freq: Two times a day (BID) | ORAL | Status: DC
  Administered 2016-10-19 – 2016-10-22 (×7): 200 mg via ORAL
  Filled 2016-10-19 (×7): qty 1

## 2016-10-19 MED ORDER — POTASSIUM CHLORIDE CRYS ER 10 MEQ PO TBCR
30.0000 meq | EXTENDED_RELEASE_TABLET | Freq: Two times a day (BID) | ORAL | Status: DC
  Administered 2016-10-19 – 2016-10-22 (×6): 30 meq via ORAL
  Filled 2016-10-19 (×6): qty 3

## 2016-10-19 MED ORDER — WARFARIN SODIUM 2 MG PO TABS
2.0000 mg | ORAL_TABLET | Freq: Every day | ORAL | Status: DC
  Administered 2016-10-19 – 2016-10-21 (×3): 2 mg via ORAL
  Filled 2016-10-19 (×3): qty 1

## 2016-10-19 MED ORDER — AMIODARONE HCL 200 MG PO TABS
200.0000 mg | ORAL_TABLET | Freq: Two times a day (BID) | ORAL | Status: DC
  Administered 2016-10-19 – 2016-10-22 (×7): 200 mg via ORAL
  Filled 2016-10-19 (×6): qty 1

## 2016-10-19 MED ORDER — GUAIFENESIN-DM 100-10 MG/5ML PO SYRP
15.0000 mL | ORAL_SOLUTION | ORAL | Status: DC | PRN
  Administered 2016-10-19: 15 mL via ORAL
  Filled 2016-10-19: qty 15

## 2016-10-19 NOTE — Care Management Important Message (Signed)
Important Message  Patient Details  Name: Catherine [name] MRN: [id number] Date of Birth: December 07, 1934   Medicare Important Message Given:  Yes    Nathen May 10/19/2016, 12:42 PM

## 2016-10-19 NOTE — Progress Notes (Signed)
CARDIAC REHAB PHASE I   PRE:  Rate/Rhythm: 98 a fib  BP:  Sitting: 128/63        SaO2: 96 RA  MODE:  Ambulation: 350 ft   POST:  Rate/Rhythm: 89 a fib  BP:  Sitting: 141/76         SaO2: 98 RA  Pt ambulated 350 ft on RA, rolling walker, hand held assist, slow, steady gait, tolerated fairly well. Pt c/o DOE, fatigue with distance, standing rest x2. Encouraged IS, additional ambulation x2 today. Pt to recliner after walk, call bell within reach. Will follow.   [id number] Lenna Sciara, RN, BSN 10/19/2016 10:57 AM

## 2016-10-19 NOTE — Progress Notes (Addendum)
[address]       [place] [address]             [phone]      5 Days Post-Op Procedure(s) (LRB): REDO MITRAL VALVE REPLACEMENT (MVR) (N/A) CORONARY ARTERY BYPASS GRAFTING (CABG) (N/A) MAZE (N/A) TRANSESOPHAGEAL ECHOCARDIOGRAM (TEE) (N/A) Subjective: Slowly feeling better, c/o SOB and generalized weakness  Objective: Vital signs in last 24 hours: Temp:  [98 F (36.7 C)-98.3 F (36.8 C)] 98 F (36.7 C) (01/23 0411) Pulse Rate:  [69-92] 92 (01/23 0411) Cardiac Rhythm: Other (Comment) (01/23 0700) Resp:  [18-20] 20 (01/23 0411) BP: (95-144)/(52-93) 144/61 (01/23 0411) SpO2:  [92 %-96 %] 93 % (01/23 0411) Weight:  [169 lb 9.6 oz (76.9 kg)] 169 lb 9.6 oz (76.9 kg) (01/23 0411)  Hemodynamic parameters for last 24 hours:    Intake/Output from previous day: 01/22 0701 - 01/23 0700 In: 720 [P.O.:720] Out: -  Intake/Output this shift: No intake/output data recorded.  General appearance: alert, cooperative and no distress Heart: irregularly irregular rhythm and no murmur Lungs: mildly dim in bases Abdomen: benign Extremities: + edema Wound: incis healing well  Lab Results:  Recent Labs  10/17/16 0246 10/18/16 0308  WBC 10.2 7.5  HGB 9.6* 8.6*  HCT 28.1* 25.3*  PLT 91* 101*   BMET:  Recent Labs  10/18/16 0308 10/19/16 0325  NA 142 139  K 3.3* 3.5  CL 105 106  CO2 28 28  GLUCOSE 112* 112*  BUN 11 11  CREATININE 0.67 0.65  CALCIUM 8.2* 8.4*    PT/INR:  Recent Labs  10/19/16 0325  LABPROT 21.6*  INR 1.85   ABG    Component Value Date/Time   PHART 7.330 (L) 10/15/2016 0406   HCO3 25.5 10/15/2016 0406   TCO2 25 10/15/2016 1742   ACIDBASEDEF 1.0 10/15/2016 0134   O2SAT 99.0 10/15/2016 0406   CBG (last 3)   Recent Labs  10/16/16 1255 10/16/16 1632 10/16/16 1924  GLUCAP 108* 117* 138*    Meds Scheduled Meds: . sodium chloride   Intravenous Once  . acetaminophen  1,000 mg Oral Q6H  . aspirin EC  81 mg Oral  Daily  . atenolol  12.5 mg Oral Daily  . bisacodyl  10 mg Oral Daily   Or  . bisacodyl  10 mg Rectal Daily  . docusate sodium  200 mg Oral Daily  . ezetimibe  10 mg Oral Daily  . folic acid-pyridoxine-cyancobalamin  1 tablet Oral Daily  . furosemide  40 mg Oral BID  . iron polysaccharides  150 mg Oral Daily  . mometasone-formoterol  2 puff Inhalation BID  . pantoprazole  40 mg Oral Daily  . potassium chloride  20 mEq Oral BID  . rosuvastatin  5 mg Oral q1800  . sodium chloride flush  3 mL Intravenous Q12H  . sodium chloride flush  3 mL Intravenous Q12H  . warfarin  2.5 mg Oral q1800  . Warfarin - Physician Dosing Inpatient   Does not apply q1800   Continuous Infusions: . sodium chloride     PRN Meds:.sodium chloride, diazepam, ipratropium-albuterol, metoprolol, ondansetron (ZOFRAN) IV, oxyCODONE, sodium chloride flush, sodium chloride flush, traMADol  Xrays No results found.  Assessment/Plan: S/P Procedure(s) (LRB): REDO MITRAL VALVE REPLACEMENT (MVR) (N/A) CORONARY ARTERY BYPASS GRAFTING (CABG) (N/A) MAZE (N/A) TRANSESOPHAGEAL ECHOCARDIOGRAM (TEE) (N/A)  1 conts to progress well 2 having some rate controlled afib, junctional with PVC's-replace K+ , add magnsium supplement, cont beta  blocker and coumadin- INR rising 3 cont diuresis for volume overload, renal fxn is normal 4 cbg adeq control 5 H/H lower- follow 6 platelets improving 7 push rehab/pulm toilet as able    LOS: 5 days    GOLD,WAYNE E 10/19/2016  I have seen and examined the patient and agree with the assessment and plan as outlined.  Will add amiodarone and continue low dose Atenolol.  Decrease coumadin dose due to possible interaction with amiodarone.  Overall looks and feels better.  Rexene Alberts, MD 10/19/2016 9:05 AM

## 2016-10-20 LAB — PROTIME-INR
INR: 2
PROTHROMBIN TIME: 23 s — AB (ref 11.4–15.2)

## 2016-10-20 MED ORDER — FUROSEMIDE 10 MG/ML IJ SOLN
40.0000 mg | Freq: Two times a day (BID) | INTRAMUSCULAR | Status: AC
  Administered 2016-10-20 (×2): 40 mg via INTRAVENOUS
  Filled 2016-10-20 (×2): qty 4

## 2016-10-20 MED ORDER — FUROSEMIDE 40 MG PO TABS
40.0000 mg | ORAL_TABLET | Freq: Two times a day (BID) | ORAL | Status: DC
  Administered 2016-10-21: 40 mg via ORAL
  Filled 2016-10-20: qty 1

## 2016-10-20 NOTE — Discharge Instructions (Signed)
Mitral Valve Replacement, Care After This sheet gives you information about how to care for yourself after your procedure. Your health care provider may also give you more specific instructions. If you have problems or questions, contact your health care provider. What can I expect after the procedure? After the procedure, it is common to have:  Pain at the incision area that may last for several weeks. Follow these instructions at home: Incision care  Follow instructions from your health care provider about how to take care of your incision. Make sure you:  Wash your hands with soap and water before you change your bandage (dressing). If soap and water are not available, use hand sanitizer.  Change your dressing as told by your health care provider.  Leave stitches (sutures), skin glue, or adhesive strips in place. These skin closures may need to stay in place for 2 weeks or longer. If adhesive strip edges start to loosen and curl up, you may trim the loose edges. Do not remove adhesive strips completely unless your health care provider tells you to do that.  Check your incision area every day for signs of infection. Check for:  More redness, swelling, or pain.  More fluid or blood.  Warmth.  Pus or a bad smell.  Do not apply powder or lotion to the area. Driving  Do not drive until your health care provider approves.  Do not drive or use heavy machinery while taking prescription pain medicines. Bathing  Do not take baths, swim, or use a hot tub for 2-4 weeks after surgery, or until your health care provider approves. Ask your health care provider if you may take showers.  To wash the incision site, gently wash with soap and water and pat the area dry with a clean towel. Do not rub the incision area. That may cause bleeding. Activity  Rest as told by your health care provider. Ask your health care provider when you can resume normal activities, including sexual  activity.  Avoid the following activities for 6-8 weeks, or as long as directed:  Lifting anything that is heavier than 10 lb (4.5 kg), or the limit that your health care provider tells you.  Pushing or pulling things with your arms.  Avoid climbing stairs and using the handrail to pull yourself up for the first 2-3 weeks after surgery.  Avoid airplane travel for 4-6 weeks, or as long as directed.  Avoid sitting for long periods of time and crossing your legs. Get up and move around at least once every 1-2 hours.  If you are taking blood thinners (anticoagulants), avoid activities that have a high risk of injury. Ask your health care provider what activities are safe for you. Lifestyle  Limit alcohol intake to no more than 1 drink a day for nonpregnant women and 2 drinks a day for men. One drink equals 12 oz of beer, 5 oz of wine, or 1 oz of hard liquor.  Do not use any products that contain nicotine or tobacco, such as cigarettes and e-cigarettes. If you need help quitting, ask your health care provider. General instructions  Take your temperature every day and weigh yourself every morning for the first 7 days after surgery. Write your temperatures and weight down and take this record with you to any follow-up visits.  Take over-the-counter and prescription medicines only as told by your health care provider.  To prevent or treat constipation while you are taking prescription pain medicine, your health care provider may  recommend that you:  Drink enough fluid to keep your urine clear or pale yellow.  Take over-the-counter or prescription medicines.  Eat foods that are high in fiber, such as fresh fruits and vegetables, whole grains, and beans.  Limit foods that are high in fat and processed sugars, such as fried and sweet foods.  Follow instructions from your health care provider about eating or drinking restrictions.  Wear compression stockings for at least 2 weeks, or as  long as told by your health care provider. These stockings help to prevent blood clots and reduce swelling in your legs. If your ankles are swollen after 2 weeks, continue to wear the stockings.  Keep all follow-up visits as told by your health care provider. This is important. Contact a health care provider if:  You develop a skin rash.  Your weight is increasing each day over 2-3 days.  You gain 2 lb (1 kg) or more in a single day.  You have a fever. Get help right away if:  You develop chest pain that feels different from the pain caused by your incision.  You develop shortness of breath or difficulty breathing.  You have more redness, swelling, or pain around your incision.  You have more fluid or blood coming from your incision.  Your incision feels warm to the touch.  You have pus or a bad smell coming from your incision.  You feel light-headed. This information is not intended to replace advice given to you by your health care provider. Make sure you discuss any questions you have with your health care provider. Document Released: 04/02/2005 Document Revised: 06/25/2016 Document Reviewed: 06/25/2016 Elsevier Interactive Patient Education  2017 [employer] Need to Know About Warfarin Warfarin is a blood thinner (anticoagulant). Anticoagulants help to prevent the formation of blood clots. They also help to stop the growth of blood clots. Who should use warfarin? Warfarin is prescribed for people who are at risk for developing harmful blood clots, such as people who have:  Surgically implanted mechanical heart valves.  Irregular heart rhythms (atrial fibrillation).  Certain clotting disorders.  A history of harmful blood clotting in the past. This includes people who have had:  A stroke.  Blood clot in the lungs (pulmonary embolism, or PE).  Blood clot in the legs (deep vein thrombosis, or DVT).  An existing blood clot. How is warfarin taken?    Warfarin is a medicine that you take by mouth (orally). Warfarin tablets come in different strengths. Each tablet strength is a different color, with the amount of warfarin printed on the tablet. If you get a new prescription filled and the color of your tablet is different than usual, tell your pharmacist or health care provider immediately. What blood tests do I need while taking warfarin? The goal of warfarin therapy is to lessen the clotting tendency of blood, but not to prevent clotting completely. Your health care provider will monitor the anticoagulation effect of warfarin closely and will adjust your dose as needed. Warfarin is a medicine that needs to be closely monitored, so it is very important to keep all lab visits and follow-up visits with your health care provider. While taking warfarin, you will need to have blood tests (prothrombin tests, or PT tests) regularly to measure your blood clotting time. This type of test can be done with a finger stick or a blood draw. What does the INR test result mean? The PT test results will be reported as the  International Normalized Ratio (INR). The INR tells your health care provider whether your dosage of warfarin needs to be changed. The longer it takes your blood to clot, the higher the INR. Your health care provider will tell you your target INR range. If your INR is not in your target range, your health care provider may adjust your dosage.  If your INR is above your target range, there is a risk of bleeding. Your dosage of warfarin may need to be decreased.  If your INR is below your target range, there is a risk of clotting. Your dosage of warfarin may need to be increased. How often is the INR test needed?  When you first start warfarin, you will usually have your INR checked every few days.  You may need to have INR tests done more than once a week until you are taking the correct dosage of warfarin.  After you have reached your target  INR, your INR will be tested less often. However, you will need to have your INR checked at least once every 4-6 weeks for the entire time you are taking warfarin. What are the side effects of warfarin? Too much warfarin can cause bleeding (hemorrhage) in any part of the body, such as:  Bleeding from the gums.  Unexplained bruises.  Bruises that get larger.  Blood in the urine.  Bloody or dark stools.  Bleeding in the brain (hemorrhagic stroke).  A nosebleed that is not easily stopped.  Coughing up blood.  Vomiting blood. Warfarin use may also cause:  Skin rash or irritations  Nausea that does not go away.  Severe pain in the back or joints.  Painful toes that turn blue or purple (purple toe syndrome).  Painful ulcers that do not go away (skin necrosis). What are the signs and symptoms of a blood clot? Too little warfarin can increase the risk of blood clots in your legs, lungs, or arms. Signs and symptoms of a DVT in your leg or arm may include:  Pain or swelling in your leg or arm.  Skin that is red or warm to the touch on your arm or leg. Signs and symptoms of a pulmonary embolism may include:  Shortness of breath or difficulty breathing.  Chest pain.  Unexplained fever. What are the signs and symptoms of a stroke? If you are taking too much or too little warfarin, you can have a stroke. Signs and symptoms of a stroke may include:  Weakness or numbness of your face, arm, or leg, especially on one side of your body.  Confusion or trouble thinking clearly.  Difficulty seeing with one or both eyes.  Difficulty walking or moving your arms or legs.  Dizziness.  Loss of balance or coordination.  Trouble speaking, trouble understanding speech, or both (aphasia).  Sudden, severe headache with no known cause.  Partial or total loss of consciousness. What precautions do I need to take while using warfarin?   Take warfarin exactly as told by your health  care provider. Doing this helps you avoid bleeding or blood clots that could result in serious injury, pain, or disability.  Take your medicine at the same time every day. If you forget to take your dose of warfarin, take it as soon as you remember that day. If you do not remember on that day, do not take an extra dose the next day.  Contact your health care provider if you miss or take an extra dose. Do not change your dosage  on your own to make up for missed or extra doses.  Wear or carry identification that says that you are taking warfarin.  Make sure that all health care providers, including your dentist, know you are taking warfarin.  If you need surgery, talk with your health care provider about whether you should stop taking warfarin before your surgery.  Avoid situations that cause bleeding. You may bleed more easily while taking warfarin. To limit bleeding, take the following actions:  Use a softer toothbrush.  Floss with waxed floss, not unwaxed floss.  Shave with an electric razor, not with a blade.  Limit your use of sharp objects.  Avoid potentially harmful activities, such as contact sports. What do I need to know about warfarin and pregnancy or breastfeeding?  Warfarin is not recommended during the first trimester of pregnancy due to an increased risk of birth defects. In certain situations, a woman may take warfarin after her first trimester of pregnancy.  If you are taking warfarin and you become pregnant or plan to become pregnant, contact your health care provider right away.  If you plan to breastfeed while taking warfarin, talk with your health care provider first. What do I need to know about warfarin and alcohol or drug use?  Avoid drinking alcohol, or limit alcohol intake to no more than 1 drink a day for nonpregnant women and 2 drinks a day for men. One drink equals 12 oz of beer, 5 oz of wine, or 1 oz of hard liquor.  If you change the amount of alcohol  that you drink, tell your health care provider. Your warfarin dosage may need to be changed.  Avoid tobacco products, such as cigarettes, chewing tobacco, and e-cigarettes. If you need help quitting, ask your health care provider.  If you change the amount of nicotine or tobacco that you use, tell your health care provider. Your warfarin dosage may need to be changed.  Avoid street drugs while taking warfarin. The effects of street drugs on warfarin are not known. What do I need to know about warfarin and other medicines or supplements?  Many prescription and over-the-counter medicines can interfere with warfarin. Talk with your health care provider or your pharmacist before starting or stopping any new medicines. This includes over-the-counter vitamins, dietary supplements, herbal medicines, and pain medicines. Your warfarin dosage may need to be adjusted.  Some common over-the-counter medicines that may increase the risk of bleeding while taking warfarin include:  Acetaminophen.  Aspirin.  NSAIDs, such as ibuprofen or naproxen.  Vitamin E. What do I need to know about warfarin and my diet?  It is important to maintain a normal, balanced diet while taking warfarin. Avoid major changes in your diet. If you are going to change your diet, talk with your health care provider before making changes.  Your health care provider may recommend that you work with a diet and nutrition specialist (dietitian).  Vitamin K decreases the effect of warfarin, and it is found in many foods. Eat a consistent amount of foods that contain vitamin K. For example, you may decide to eat 2 vitamin K-containing foods each day. Most foods that are high in vitamin K are green and leafy. Common foods that contain high amounts of vitamin K include:  Kale, raw or cooked.  Spinach, raw or cooked.  Collards, raw or cooked.  Swiss chard, raw or cooked.  Mustard greens, raw or cooked.  Turnip greens, raw or  cooked.  Parsley, raw.  Broccoli, cooked.  Noodles, eggs, and spinach, enriched.  Brussels sprouts, raw or cooked.  Beet greens, raw or cooked.  Endive, raw.  Cabbage, cooked.  Asparagus, cooked. Foods that contain moderate amounts of vitamin K include:  Broccoli, raw.  Cabbage, raw.  Bok choy, cooked.  Green leaf lettuce, raw  Prunes, stewed.  Angie Fava.  Kiwi.  Edamame, cooked.  Romaine lettuce, raw.  Avocado.  Tuna, canned in oil.  Okra, cooked.  Black-eyed peas, cooked.  Green beans, cooked or raw.  Blueberries, raw.  Blackberries, raw.  Peas, cooked or raw. Contact a health care provider if:  You miss a dose.  You take an extra dose.  You plan to have any kind of surgery or procedure.  You are unable to take your medicine due to nausea, vomiting, or diarrhea.  You have any major changes in your diet or you plan to make any major changes in your diet.  You start or stop any over-the-counter medicine, prescription medicine, or dietary supplement.  You become pregnant, plan to become pregnant, or think you may be pregnant.  You have menstrual periods that are heavier than usual.  You have unusual bruising. Get help right away if:  You develop symptoms of an allergic reaction, such as:  Swelling of the lips, face, tongue, mouth, or throat.  Rash.  Itching.  Itchy, red, swollen areas of skin (hives).  Trouble breathing.  Chest tightness.  You have:  Signs or symptoms of a stroke.  Signs or symptoms of a blood clot.  A fall or have an accident, especially if you hit your head.  Blood in your urine. Your urine may look reddish, pinkish, or tea-colored.  Blood in your stool. Your stool may be black or bright red.  Bleeding that does not stop after applying pressure to the area for 30 minutes.  Severe pain in your joints or back.  Purple or blue toes.  Skin ulcers that do not go away.  You vomit blood or cough up  blood. The blood may be bright red, or it may look like coffee grounds. These symptoms may represent a serious problem that is an emergency. Do not wait to see if the symptoms will go away. Get medical help right away. Call your local emergency services (911 in the [place] Do not drive yourself to the hospital.  Summary  Warfarin needs to be closely monitored with blood tests. It is very important to keep all lab visits and follow-up visits with your health care provider.  Make sure that you know your target INR range and your warfarin dosage.  Wear or carry identification that says that you are taking warfarin.  Take warfarin at the same time every day. Call your health care provider if you miss a dose or if you take an extra dose. Do not change the dosage of warfarin on your own.  Know the signs and symptoms of blood clots, bleeding, and a stroke. Know when to get emergency medical help.  Tell all health care providers who care for you that you are taking warfarin.  Talk with your health care provider or your pharmacist before starting or stopping any new medicines.  Monitor how much vitamin K you eat every day. Try to eat the same amount every day. This information is not intended to replace advice given to you by your health care provider. Make sure you discuss any questions you have with your health care provider. Document Released: 09/13/2005 Document Revised: 05/25/2016 Document Reviewed: 12/10/2015 Elsevier  Interactive Patient Education  2017 [employer]   Coronary Artery Bypass Grafting, Care After These instructions give you information on caring for yourself after your procedure. Your doctor may also give you more specific instructions. Call your doctor if you have any problems or questions after your procedure. Follow these instructions at home:  Only take medicine as told by your doctor. Take medicines exactly as told. Do not stop taking medicines or start any new medicines  without talking to your doctor first.  Take your pulse as told by your doctor.  Do deep breathing as told by your doctor. Use your breathing device (incentive spirometer), if given, to practice deep breathing several times a day. Support your chest with a pillow or your arms when you take deep breaths or cough.  Keep the area clean, dry, and protected where the surgery cuts (incisions) were made. Remove bandages (dressings) only as told by your doctor. If strips were applied to surgical area, do not take them off. They fall off on their own.  Check the surgery area daily for puffiness (swelling), redness, or leaking fluid.  If surgery cuts were made in your legs:  Avoid crossing your legs.  Avoid sitting for long periods of time. Change positions every 30 minutes.  Raise your legs when you are sitting. Place them on pillows.  Wear stockings that help keep blood clots from forming in your legs (compression stockings).  Only take sponge baths until your doctor says it is okay to take showers. Pat the surgery area dry. Do not rub the surgery area with a washcloth or towel. Do not bathe, swim, or use a hot tub until your doctor says it is okay.  Eat foods that are high in fiber. These include raw fruits and vegetables, whole grains, beans, and nuts. Choose lean meats. Avoid canned, processed, and fried foods.  Drink enough fluids to keep your pee (urine) clear or pale yellow.  Weigh yourself every day.  Rest and limit activity as told by your doctor. You may be told to:  Stop any activity if you have chest pain, shortness of breath, changes in heartbeat, or dizziness. Get help right away if this happens.  Move around often for short amounts of time or take short walks as told by your doctor. Gradually become more active. You may need help to strengthen your muscles and build endurance.  Avoid lifting, pushing, or pulling anything heavier than 10 pounds (4.5 kg) for at least 6 weeks  after surgery.  Do not drive until your doctor says it is okay.  Ask your doctor when you can go back to work.  Ask your doctor when you can begin sexual activity again.  Follow up with your doctor as told. Contact a doctor if:  You have puffiness, redness, more pain, or fluid draining from the incision site.  You have a fever.  You have puffiness in your ankles or legs.  You have pain in your legs.  You gain 2 or more pounds (0.9 kg) a day.  You feel sick to your stomach (nauseous) or throw up (vomit).  You have watery poop (diarrhea). Get help right away if:  You have chest pain that goes to your jaw or arms.  You have shortness of breath.  You have a fast or irregular heartbeat.  You notice a "clicking" in your breastbone when you move.  You have numbness or weakness in your arms or legs.  You feel dizzy or light-headed. This information  is not intended to replace advice given to you by your health care provider. Make sure you discuss any questions you have with your health care provider. Document Released: 09/18/2013 Document Revised: 02/19/2016 Document Reviewed: 02/20/2013 Elsevier Interactive Patient Education  2017 [employer].

## 2016-10-20 NOTE — Progress Notes (Addendum)
[place] [address]       [place] [id number]             [phone]      6 Days Post-Op Procedure(s) (LRB): REDO MITRAL VALVE REPLACEMENT (MVR) (N/A) CORONARY ARTERY BYPASS GRAFTING (CABG) (N/A) MAZE (N/A) TRANSESOPHAGEAL ECHOCARDIOGRAM (TEE) (N/A) Subjective: Feels fair, didn't sleep well, + non-productive cough  Objective: Vital signs in last 24 hours: Temp:  [98.3 F (36.8 C)-98.8 F (37.1 C)] 98.6 F (37 C) (01/24 0529) Pulse Rate:  [72-99] 76 (01/24 0529) Cardiac Rhythm: Junctional rhythm (01/23 1900) Resp:  [18-21] 18 (01/24 0529) BP: (97-130)/(56-77) 119/77 (01/24 0529) SpO2:  [94 %-95 %] 95 % (01/24 0529)  Hemodynamic parameters for last 24 hours:    Intake/Output from previous day: 01/23 0701 - 01/24 0700 In: 720 [P.O.:720] Out: -  Intake/Output this shift: No intake/output data recorded.  General appearance: alert, cooperative and no distress Heart: regular rate and rhythm and soft systolic murmur Lungs: min dim in bases Abdomen: benign Extremities: + pitting edema Wound: incis healing well  Lab Results:  Recent Labs  10/18/16 0308  WBC 7.5  HGB 8.6*  HCT 25.3*  PLT 101*   BMET:  Recent Labs  10/18/16 0308 10/19/16 0325  NA 142 139  K 3.3* 3.5  CL 105 106  CO2 28 28  GLUCOSE 112* 112*  BUN 11 11  CREATININE 0.67 0.65  CALCIUM 8.2* 8.4*    PT/INR:  Recent Labs  10/20/16 0307  LABPROT 23.0*  INR 2.00   ABG    Component Value Date/Time   PHART 7.330 (L) 10/15/2016 0406   HCO3 25.5 10/15/2016 0406   TCO2 25 10/15/2016 1742   ACIDBASEDEF 1.0 10/15/2016 0134   O2SAT 99.0 10/15/2016 0406   CBG (last 3)  No results for input(s): GLUCAP in the last 72 hours.  Meds Scheduled Meds: . sodium chloride   Intravenous Once  . amiodarone  200 mg Oral BID PC  . aspirin EC  81 mg Oral Daily  . atenolol  12.5 mg Oral Daily  . bisacodyl  10 mg Oral Daily   Or  . bisacodyl  10 mg Rectal Daily  . docusate sodium  200  mg Oral Daily  . ezetimibe  10 mg Oral Daily  . folic acid-pyridoxine-cyancobalamin  1 tablet Oral Daily  . furosemide  40 mg Oral BID  . iron polysaccharides  150 mg Oral Daily  . magnesium oxide  200 mg Oral BID  . mometasone-formoterol  2 puff Inhalation BID  . pantoprazole  40 mg Oral Daily  . potassium chloride  30 mEq Oral BID  . rosuvastatin  5 mg Oral q1800  . sodium chloride flush  3 mL Intravenous Q12H  . sodium chloride flush  3 mL Intravenous Q12H  . warfarin  2 mg Oral q1800  . Warfarin - Physician Dosing Inpatient   Does not apply q1800   Continuous Infusions: . sodium chloride     PRN Meds:.sodium chloride, diazepam, guaiFENesin-dextromethorphan, ipratropium-albuterol, metoprolol, ondansetron (ZOFRAN) IV, oxyCODONE, sodium chloride flush, sodium chloride flush, traMADol  Xrays No results found.  Assessment/Plan: S/P Procedure(s) (LRB): REDO MITRAL VALVE REPLACEMENT (MVR) (N/A) CORONARY ARTERY BYPASS GRAFTING (CABG) (N/A) MAZE (N/A) TRANSESOPHAGEAL ECHOCARDIOGRAM (TEE) (N/A)  1 doing well 2 volume oveload, cont lasix at current dose for now 3 some intermit afib., QTc 475- cont current meds- INR 2.0, cont coumadin at currentdose  4 no new labs- recheck in am  5 d/c epw's  6 push rehab and pulm toilet 7 poss home 1-2 days    LOS: 6 days    GOLD,WAYNE E 10/20/2016   I have seen and examined the patient and agree with the assessment and plan as outlined.  Will give lasix IV today.  Rexene Alberts, MD 10/20/2016 8:38 AM

## 2016-10-20 NOTE — Progress Notes (Signed)
CARDIAC REHAB PHASE I   PRE:  Rate/Rhythm: 70 junctional  BP:  Sitting: 136/51        SaO2: 96 RA  MODE:  Ambulation: 150 ft   POST:  Rate/Rhythm: 74 junctional  BP:  Sitting: 119/60         SaO2: 95 RA  Pt in bed, agreeable to walk, states she did not sleep well and just feels "washed out today." Pt  ambulated 150 ft on RA, rolling walker, assist x1, slow, steady gait, tolerated well with no complaints other than generalized fatigue, weakness, mild DOE. Encouraged IS, additional ambulation x2 today. Pt to recliner after walk, call bell within reach. Will follow.  [id number] Lenna Sciara, RN, BSN 10/20/2016 11:31 AM

## 2016-10-20 NOTE — Discharge Summary (Signed)
Physician Discharge Summary       [address]       [place] [address]             [phone]    Patient ID: Catherine [name] MRN: [id number] DOB/AGE: Jan 28, 1935 82 y.o.  Admit date: 10/14/2016 Discharge date: 10/22/2016  Admission Diagnoses: 1. Severe mitral regurgitation 2. Severe coronary artery disease 3. Long standing persistent atrial fibrillation (HCC)  Active Diagnoses:  1. Rheumatic heart disease 2. Essential hypertension 3. COPD GOLD II  4. Chronic diastolic CHF (congestive heart failure) (HCC) 5. UTI (urinary tract infection) 6. Cellulitis of left lower extremity-Ulcer associated with severe venous insufficiency 7. Asthma-last attack 02/2015 8. Arthritis 9. Chronic anticoagulation 10. Remote history of tobacco abuse 11. Chronic anxiety 12. ABL anemia 13. Thrombocytopenia  Procedure (s):   Mitral Valve Replacement              Redo Median Sternotomy             Medtronic Mosaic Porcine Bioprosthetic Tissue Valve (size 80mm, model #310, serial [id number])   Coronary Artery Bypass Grafting x 1              Saphenous Vein Graft to Distal Right Coronary Artery             Endoscopic Vein Harvest from Right Thigh    Maze Procedure              complete bilateral atrial lesion set using bipolar radiofrequency and cryothermy ablation by Dr. Roxy Manns on 10/14/2016.  History of Presenting Illness: Patient is a 82 year old female with history of rheumatic fever during childhood and long-standing history of rheumatic heart disease, status post open mitral commissurotomy via left thoracotomy approach in 1970, long-standing persistent atrial fibrillation, chronic diastolic congestive heart failure, hypertension, and COPD who has been referred for surgical consultation to discuss treatment options for management of severe mitral regurgitation and mitral stenosis. The patient doesn't recall being hospitalized for rheumatic fever during childhood but she was  diagnosed with it and the noted to have a heart murmur during her teenage years. She developed severe congestive heart failure and underwent open mitral commissurotomy in 1970. Her recovery was slow and prolonged but she ultimately did remarkably well. Approximately 10 years later she developed atrial fibrillation. She underwent cardioversion at least 2 or 3 times but has remained in long-standing persistent atrial fibrillation for many years. She has been chronically anticoagulated using warfarin for at least 30 years. For a long time she was followed by [name], but since his retirement she has been followed by Dr. Oval Linsey. Over the past several years the patient has developed worsening symptoms of exertional shortness of breath and fatigue. She has a remote history of tobacco use although she quit smoking in 1977. She has been followed by Dr. [name] for COPD. She was seen in follow-up recently by Dr. Oval Linsey and follow-up echocardiogram revealed normal left ventricular systolic function with ejection fraction estimated 65-70%. Peak and mean transvalvular gradients across the mitral valve were estimated 22 and 8 mmHg respectively, corresponding to valve area estimated 1.9 cm. However, there was moderate to severe mitral regurgitation. She subsequently underwent TEE to further evaluate the severity of mitral regurgitation on 07/05/2016. This confirmed the presence of normal left ventricular systolic function. Mean transvalvular gradient across the mitral valve was estimated 5 mmHg. There was moderate to severe mitral regurgitation with EROmeasured 0.25 cm corresponding to regurgitant volume estimated 48 mL using PISA.  There was severe left and right atrial enlargement with no evidence of thrombus in the left atrial appendage. Due to equivocal findings, stress echocardiography was performed 07/27/2016. With stress the mean gradient across the mitral valve increased from 6-14 mmHg, and mitral  regurgitation appeared severe. In addition, the patient had some mild ST segment changes on EKG during peak stress. The patient subsequently underwent left and right heart catheterization 08/03/2016. At catheterization the patient had normal left ventricular systolic function with severe mitral valve stenosis, moderate multivessel coronary artery disease with tandem 80% stenosis in the right coronary artery, 50% stenosis in the left circumflex coronary artery, and only mild pulmonary hypertension. In addition, the right brachial artery appeared occluded. The patient was referred for surgical consultation.  She was originally seen in consultation on 08/04/2016 and we had previously plan to proceed with elective redo mitral valve replacement, coronary artery bypass grafting and Maze procedure on 08/26/2016. However, just prior to that she stumbled and injured her right lower leg. She developed an ulceration on the anterior surface of the right lower leg with significant associated cellulitis. Surgery was postponed and the patient has been seen in consultation by Dr. Donzetta Matters at [facility] because of her underlying severe chronic venous insufficiency. The patient returns to our office today for follow-up. The ulceration on her right lower leg has been slowly healing and the cellulitis improved, but the wound has not completely healed. She continues to experience exertional shortness breath with very low level activity. She has trouble sleeping at night particularly when she tries to lay flat in bed. Her weight has been stable and she has not developed increased lower extremity edema. The remainder of her review of systems is unchanged from previously.    The patient is married and lives with her husband in [place]. She still works part-time as a Theme park manager. She has remained remarkably active throughout her adult life although she complains that she has had to slow down dramatically for the past 3 years or more  because of worsening exertional shortness of breath and fatigue. She states that she has had some degree of exertional shortness of breath for many many years. Symptoms have gradually progressed over the past several years and she now gets short of breath with very mild activity and occasionally at rest. Her symptoms significantly limited her daily physical activities. She cannot lay flat in bed when she sleeps. She sometimes wakes up in the morning feeling her heart racing and short of breath. She has chronic swelling of both lower legs. She denies any history of exertional chest pain or chest tightness. She also complains of chronic swelling in pressure-like sensation in the right upper quadrant. Patient has long-standing rheumatic heart disease with history of previous open mitral commissurotomy in 1970 and now presents with stage D severe symptomatic mitral regurgitation and mitral stenosis. She describes a long-standing history of symptoms consistent with chronic diastolic congestive heart failure that of progressed gradually over the last several years and are now quite limiting, consistent with New York Heart Association functional class III. The patient also has more than 30 years history of long-standing persistent atrial fibrillation on long-term anticoagulation using warfarin. I have personally reviewed the patient's recent echocardiograms and diagnostic cardiac catheterization. Echocardiograms revealed classical rheumatic mitral valve disease with severe thickening and restricted leaflet mobility involving both leaflets of the mitral valve with severe thickening and foreshortening of the subvalvular apparatus. There is type IIIAmitral valve dysfunction with severe mitral regurgitation. There is massive enlargement  of both left and right atrium. There is only mild tricuspid regurgitation but the tricuspid annulus does appear somewhat dilated. Left ventricular systolic function is normal. There is no  significant aortic valve disease. Diagnostic cardiac catheterization confirmed the presence of normal left ventricular systolic function and reveals only mild pulmonary hypertension, but also revealed multivessel coronary artery disease with tandem 80% stenosis in the right coronary artery. Dr. Roxy Manns agreed the patient needs mitral valve replacement and coronary artery bypass grafting. She might benefit from concomitant maze procedure in terms of improved rhythm control and decreased risk of thromboembolism, although the likelihood of restoration of sinus rhythm is probably very low because of the long duration of atrial fibrillation. There is a significant likelihood that the patient might require permanent pacemaker placement because of symptomatic bradycardia if maze procedure is performed. Potential risks, benefits, and complications of the surgery were explained to the patient and she agreed to proceed with surgery.  The patient has been instructed to stop taking Coumadin after she takes her dose the Thursday. She will begin Lovenox injections for bridging therapy on Saturday.   Brief Hospital Course:  The patient was extubated early the morning of post operative day without difficulty. She remained afebrile and hemodynamically stable. She was initially AAI paced and was weaned off of Neo Synephrine drip. Gordy Councilman, a line, chest tubes, and foley were removed early in the post operative course.  She was restarted on Coumadin. Her last INR was 2.2 and her current Coumadin dose is 2 mg daily. She was volume over loaded and diuresed. She had ABL anemia. She did not require a post op transfusion. Last H and H was 8.8/26. She also had thrombocytopenia post op. Her last platelet count was 161. She was weaned off the insulin drip. .The patient's HGA1C pre op was  5.3. Atenolol was started and titrated accordingly on 01/22. Her rhythm was controlled rate a fib/junctional. She was then started on Amiodarone on  10/19/2016. The patient was felt surgically stable for transfer from the ICU to [facility]. She continues to progress with cardiac rehab. She was ambulating on room air. She has been tolerating a diet and has had a bowel movement. Epicardial pacing wires were removed on 10/18/2016. Chest tube sutures will be removed the day of discharge. The patient is felt surgically stable for discharge today.    Latest Vital Signs: Blood pressure (!) 153/47, pulse 69, temperature 98.1 F (36.7 C), temperature source Oral, resp. rate 18, height 5\' 4"  (1.626 m), weight 163 lb 11.2 oz (74.3 kg), SpO2 96 %.  Physical Exam: General appearance: alert, cooperative and no distress Heart: regular rate and rhythm and soft systolic murmur Lungs: min dim in bases Abdomen: benign Extremities: + pitting edema Wound: incis healing well  Discharge Condition:Stable and discharged to home.  Recent laboratory studies:  Lab Results  Component Value Date   WBC 9.0 10/21/2016   HGB 8.8 (L) 10/21/2016   HCT 26.2 (L) 10/21/2016   MCV 92.3 10/21/2016   PLT 161 10/21/2016   Lab Results  Component Value Date   NA 140 10/21/2016   K 3.7 10/21/2016   CL 104 10/21/2016   CO2 29 10/21/2016   CREATININE 0.74 10/21/2016   GLUCOSE 101 (H) 10/21/2016    Diagnostic Studies:  Dg Chest Port 1 View  Result Date: 10/17/2016 CLINICAL DATA:  Congestive heart failure EXAM: PORTABLE CHEST 1 VIEW COMPARISON:  October 16, 2016 FINDINGS: Stable cardiomegaly. The pleural effusion and underlying opacity in  the left base are stable. No overt edema. The pleural effusion may demonstrate an air-fluid level suggesting the possibility of a tiny amount of air in the pleural space as well. This is stable. No other change. IMPRESSION: 1. Stable left pleural effusion and underlying atelectasis. There may be a small amount of air in the left pleural space with an air-fluid level at the base. These findings are all unchanged. No edema. Support apparatus has  been removed. Electronically Signed   By: Dorise Bullion III M.D   On: 10/17/2016 07:25  Discharge Instructions    Amb Referral to Cardiac Rehabilitation    Complete by:  As directed    Diagnosis:   CABG Valve Replacement     Valve:  Mitral Comment - MAZE   CABG X ___:  1     Discharge Medications: Allergies as of 10/22/2016      Reactions   Aldactone [spironolactone] Other (See Comments)   dyspnea   Amoxicillin Palpitations   Tachycardia Has patient had a PCN reaction causing immediate rash, facial/tongue/throat swelling, SOB or lightheadedness with hypotension: no Has patient had a PCN reaction causing severe rash involving mucus membranes or skin necrosis: {no Has patient had a PCN reaction that required hospitalization {no Has patient had a PCN reaction occurring within the last 10 years: {yes If all of the above answers are "NO", then may proceed with Cephalosporin use.   Diltiazem Other (See Comments)   Causing headaches   Flagyl [metronidazole Hcl] Other (See Comments)   Causing headaches   Flovent [fluticasone Propionate] Other (See Comments)   Leg cramps   Gabapentin Swelling   Lyrica [pregabalin] Swelling   Quinidine Diarrhea, Other (See Comments)   Fever diarrhea   Simvastatin Other (See Comments)   Leg pain, myalgia   Tramadol Nausea Only   Verapamil Other (See Comments)   myalgias   Ace Inhibitors Other (See Comments)   unknown   Benazepril Hcl Cough   Ciprocin-fluocin-procin [fluocinolone Acetonide] Other (See Comments)   unknown   Ciprofloxacin Diarrhea   Codeine Nausea Only   Nitrofurantoin Monohyd Macro Nausea Only      Medication List    STOP taking these medications   albuterol 108 (90 Base) MCG/ACT inhaler Commonly known as:  PROVENTIL HFA;VENTOLIN HFA   enoxaparin 100 MG/ML injection Commonly known as:  LOVENOX   potassium chloride 10 MEQ tablet Commonly known as:  K-DUR Replaced by:  potassium chloride SA 15 MEQ tablet   PROAIR HFA  108 (90 Base) MCG/ACT inhaler Generic drug:  albuterol     TAKE these medications   acetaminophen 500 MG tablet Commonly known as:  TYLENOL Take 500 mg by mouth every 6 (six) hours as needed for mild pain.   amiodarone 200 MG tablet Commonly known as:  PACERONE Take 1 tablet (200 mg total) by mouth 2 (two) times daily after a meal.   aspirin 81 MG EC tablet Take 1 tablet (81 mg total) by mouth daily.   atenolol 25 MG tablet Commonly known as:  TENORMIN Take 0.5 tablets (12.5 mg total) by mouth daily.   B-complex with vitamin C tablet Take 1 tablet by mouth daily.   budesonide-formoterol 80-4.5 MCG/ACT inhaler Commonly known as:  SYMBICORT Inhale 2 puffs into the lungs 2 (two) times daily.   diazepam 5 MG tablet Commonly known as:  VALIUM Take one half tablet each morning and one at night prn anxiety What changed:  how much to take  how to take  this  when to take this  additional instructions   ezetimibe 10 MG tablet Commonly known as:  ZETIA Take 10 mg by mouth daily. What changed:  Another medication with the same name was removed. Continue taking this medication, and follow the directions you see here.   ferrous sulfate 325 (65 FE) MG tablet Commonly known as:  FERROUSUL Take 1 tablet (325 mg total) by mouth daily with breakfast.   folic acid 1 MG tablet Commonly known as:  FOLVITE Take 1 tablet (1 mg total) by mouth daily.   furosemide 40 MG tablet Commonly known as:  LASIX Take 1 tablet (40 mg total) by mouth 2 (two) times daily. For 7 days then once daily What changed:  medication strength  additional instructions   multivitamin tablet Take 1 tablet by mouth daily.   oxyCODONE 5 MG immediate release tablet Commonly known as:  Oxy IR/ROXICODONE Take 1-2 tablets (5-10 mg total) by mouth every 6 (six) hours as needed for severe pain.   potassium chloride SA 15 MEQ tablet Commonly known as:  KLOR-CON M15 Take 2 tablets (30 mEq total) by mouth 2  (two) times daily. For 7 days, then once daily Replaces:  potassium chloride 10 MEQ tablet   rosuvastatin 5 MG tablet Commonly known as:  CRESTOR Take 1 tablet (5 mg total) by mouth daily at 6 PM.   triamterene-hydrochlorothiazide 37.5-25 MG tablet Commonly known as:  MAXZIDE-25 Take 1 tablet by mouth daily.   warfarin 2 MG tablet Commonly known as:  COUMADIN Take 1 tablet (2 mg total) by mouth daily at 6 PM. As directed by the coumadin clinic What changed:  how much to take  when to take this  additional instructions     The patient has been discharged on:   1.Beta Blocker:  Yes [  x ]                              No   [   ]                              If No, reason:  2.Ace Inhibitor/ARB: Yes [   ]                                     No  [    ]                                     If No, reason:  3.Statin:   Yes [ x  ]                  No  [   ]                  If No, reason:  4.Ecasa:  Yes  [  x ]                  No   [   ]                  If No, reason:  Follow Up Appointments: Follow-up [place], MD Follow up.   Specialty:  Cardiology Why:  Appointment time is at- contact office for time Contact information: [address] [place] [address] [phone]        Rexene Alberts, MD Follow up on 11/22/2016.   Specialty:  Cardiothoracic Surgery Why:  PA/LAT CXR to be taken (at [facility] which is in the same building as Dr. Guy Sandifer office) on 11/22/2016 at 4:00 pm ;Appointment time is at 4:30 pm Contact information: [place] [address] [place] [address] [place] Office Follow up on 10/25/2016.   Specialty:  Cardiology Why:  Appointment time is at-contact office for time Contact information: [address] [place] Follow up.   Specialty:   Cardiology Why:  04/18/17 at 11:30 am Contact information: [address] [id number] [place] [phone]          Signed: Macy Mis 10/22/2016, 8:25 AM

## 2016-10-21 LAB — BASIC METABOLIC PANEL
ANION GAP: 7 (ref 5–15)
BUN: 8 mg/dL (ref 6–20)
CHLORIDE: 104 mmol/L (ref 101–111)
CO2: 29 mmol/L (ref 22–32)
CREATININE: 0.74 mg/dL (ref 0.44–1.00)
Calcium: 8.6 mg/dL — ABNORMAL LOW (ref 8.9–10.3)
GFR calc non Af Amer: >60 mL/min (ref >60)
Glucose, Bld: 101 mg/dL — ABNORMAL HIGH (ref 65–99)
POTASSIUM: 3.7 mmol/L (ref 3.5–5.1)
Sodium: 140 mmol/L (ref 135–145)

## 2016-10-21 LAB — CBC
HEMATOCRIT: 26.2 % — AB (ref 36.0–46.0)
HEMOGLOBIN: 8.8 g/dL — AB (ref 12.0–15.0)
MCH: 31 pg (ref 26.0–34.0)
MCHC: 33.6 g/dL (ref 30.0–36.0)
MCV: 92.3 fL (ref 78.0–100.0)
PLATELETS: 161 10*3/uL (ref 150–400)
RBC: 2.84 MIL/uL — AB (ref 3.87–5.11)
RDW: 14.5 % (ref 11.5–15.5)
WBC: 9 10*3/uL (ref 4.0–10.5)

## 2016-10-21 LAB — PROTIME-INR
INR: 2.17
Prothrombin Time: 24.6 seconds — ABNORMAL HIGH (ref 11.4–15.2)

## 2016-10-21 MED ORDER — FUROSEMIDE 10 MG/ML IJ SOLN
40.0000 mg | Freq: Two times a day (BID) | INTRAMUSCULAR | Status: AC
  Administered 2016-10-21 (×2): 40 mg via INTRAVENOUS
  Filled 2016-10-21 (×2): qty 4

## 2016-10-21 MED ORDER — FUROSEMIDE 40 MG PO TABS
40.0000 mg | ORAL_TABLET | Freq: Two times a day (BID) | ORAL | Status: DC
  Administered 2016-10-22: 40 mg via ORAL
  Filled 2016-10-21: qty 1

## 2016-10-21 MED ORDER — POTASSIUM CHLORIDE CRYS ER 20 MEQ PO TBCR
40.0000 meq | EXTENDED_RELEASE_TABLET | Freq: Once | ORAL | Status: AC
  Administered 2016-10-21: 40 meq via ORAL
  Filled 2016-10-21: qty 2

## 2016-10-21 NOTE — Evaluation (Signed)
Physical Therapy Evaluation Patient Details Name: Catherine [name] MRN: [id number] DOB: 26-Jan-1935 Today's Date: 10/21/2016   History of Present Illness  Patient is a 82 y/o female admitted s/p MVR and CABG x 1.  PMH positive for CHF, CKD, a-fib, valvular heart disease, recent cellulitis, HTN, CHF and CAD.  Clinical Impression  Patient presents s/p above procedure with decreased mobility due to deficits listed in PT problem list.  Patient currently at min to S level with mobility and appropriate for home with spouse assist and follow up HHPT.  Will follow up prior to d/c.     Follow Up Recommendations Home health PT    Equipment Recommendations  None recommended by PT    Recommendations for Other Services       Precautions / Restrictions Precautions Precautions: Sternal      Mobility  Bed Mobility Overal bed mobility: Needs Assistance Bed Mobility: Rolling;Sidelying to Sit;Sit to Sidelying Rolling: Supervision Sidelying to sit: Min assist     Sit to sidelying: Min guard General bed mobility comments: cues for technique with sternal precautions and holding chest pillow for stability and comfort, spouse present and educated how to assist with coming up to sit  Transfers Overall transfer level: Needs assistance Equipment used: Rolling walker (2 wheeled) Transfers: Sit to/from Stand Sit to Stand: Supervision         General transfer comment: for safety  Ambulation/Gait Ambulation/Gait assistance: Supervision Ambulation Distance (Feet): 300 Feet Assistive device: Rolling walker (2 wheeled) Gait Pattern/deviations: Step-through pattern;Decreased stride length;Shuffle     General Gait Details: slow steady pace, except one standing rest break pt. directed due to fatigue, SpO2 93 and above on RA with ambulation  Stairs            Wheelchair Mobility    Modified Rankin (Stroke Patients Only)       Balance Overall balance assessment: Needs assistance    Sitting balance-Leahy Scale: Good       Standing balance-Leahy Scale: Good Standing balance comment: able to move about in room without UE support                             Pertinent Vitals/Pain Pain Assessment: Faces Pain Score: 3  Faces Pain Scale: Hurts little more Pain Location: arms, chest Pain Descriptors / Indicators: Sore Pain Intervention(s): Monitored during session    Home Living Family/patient expects to be discharged to:: Private residence Living Arrangements: Spouse/significant other Available Help at Discharge: Family Type of Home: House Home Access: Level entry     Home Layout: One level Home Equipment: Environmental consultant - 2 wheels;Shower seat;Bedside commode      Prior Function Level of Independence: Independent               Hand Dominance        Extremity/Trunk Assessment   Upper Extremity Assessment Upper Extremity Assessment: Generalized weakness    Lower Extremity Assessment Lower Extremity Assessment: Generalized weakness       Communication   Communication: No difficulties  Cognition Arousal/Alertness: Awake/alert Behavior During Therapy: WFL for tasks assessed/performed Overall Cognitive Status: Within Functional Limits for tasks assessed                      General Comments      Exercises     Assessment/Plan    PT Assessment Patient needs continued PT services  PT Problem List Decreased strength;Decreased mobility;Pain;Decreased knowledge of precautions;Decreased  activity tolerance          PT Treatment Interventions DME instruction;Therapeutic activities;Gait training;Stair training;Balance training;Functional mobility training    PT Goals (Current goals can be found in the Care Plan section)  Acute Rehab PT Goals Patient Stated Goal: To return to independent PT Goal Formulation: With patient/family Time For Goal Achievement: 11/25/16 Potential to Achieve Goals: Good    Frequency Min 3X/week    Barriers to discharge        Co-evaluation               End of Session Equipment Utilized During Treatment: Gait belt Activity Tolerance: Patient tolerated treatment well Patient left: in chair;with call bell/phone within reach;with family/visitor present           Time: [id number] PT Time Calculation (min) (ACUTE ONLY): 25 min   Charges:   PT Evaluation $PT Eval Moderate Complexity: 1 Procedure PT Treatments $Gait Training: 8-22 mins   PT G CodesReginia Naas 11-14-2016, 4:49 PM  Magda Kiel, [place] 14-Nov-2016

## 2016-10-21 NOTE — Progress Notes (Signed)
CARDIAC REHAB PHASE I   PRE:  Rate/Rhythm: 64 junctional  BP:  Sitting: 131/58        SaO2: 95 RA  MODE:  Ambulation: 230 ft   POST:  Rate/Rhythm: 64   BP:  Sitting: 135/95         SaO2: 97 RA  Pt still tired, states she feels somewhat better today. Pt ambulated 230 ft on RA, rolling walker, assist x1, steady gait, tolerated fairly well. Pt c/o DOE, general fatigue, standing rest x3. Pt may benefit from HHPT at discharge. Cardiac surgery discharge education completed with pt and husband at bedside. Reviewed IS, sternal precautions, activity progression, exercise, heart healthy diet, sodium restrictions, daily weights and phase 2 cardiac rehab. Pt verbalized understanding. Pt agrees to phase 2 cardiac rehab referral, will send to [facility] per pt request.   [id number] Lenna Sciara, RN, BSN 10/21/2016 12:07 PM

## 2016-10-21 NOTE — Consult Note (Signed)
[facility] CM Primary Care Navigator  10/21/2016  ELFIE COSTANZA [birth year] [id number]  Met with patient and husband (Will) at the bedside to identify possible discharge needs. Patient mentioned noticing a decrease in energy-"feeling like going downhill" and some shortness of breath had led to this admission/surgery.  Patient endorses Dr. [name] with [facility] [place] as the primary care provider.    Patient shared using [facility] to obtain medications without difficulty.  Patient reports managing her medications at home using "pill box" system weekly.   She states being able to drive prior to admission/surgery, however, husband will be providing transportation to her doctors' appointments.  Husband and daughter Almyra Free) will be her primary caregivers at home as stated.   Discharge plan is home with home health services per patient. Husband reports that patient will be attending cardiac rehabilitation at [facility]) after some weeks of recovery.  Patient and husband voiced understanding to call primary care provider's office when she returns home, for a post discharge follow-up appointment within a week or sooner if needs arise. Patient letter provided as a reminder.  Explained to patient and husband about [facility] CM services available for disease management (COPD and HF) but patient decides to work on recovering from open heart surgery for now to avoid getting overwhelmed as stated, then, will discuss it with her doctor on follow-up visit. Made patient and husband aware that referral to [facility] care management can be sought from primary care provider if decided that services are needed or deemed appropriate. [facility] care management contact information provided for future needs.     For additional questions please contact:  Edwena Felty A. Kesean Serviss, BSN, RN-BC [facility] PRIMARY CARE Navigator Cell: [phone]

## 2016-10-21 NOTE — Progress Notes (Addendum)
[address]       [place] [address]             [phone]      7 Days Post-Op Procedure(s) (LRB): REDO MITRAL VALVE REPLACEMENT (MVR) (N/A) CORONARY ARTERY BYPASS GRAFTING (CABG) (N/A) MAZE (N/A) TRANSESOPHAGEAL ECHOCARDIOGRAM (TEE) (N/A) Subjective: Feels good after a good night's sleep but felt poorly yesterday. Cough improved  Objective: Vital signs in last 24 hours: Temp:  [97.7 F (36.5 C)-99.1 F (37.3 C)] 97.7 F (36.5 C) (01/25 0540) Pulse Rate:  [65-69] 68 (01/25 0540) Cardiac Rhythm: Atrial fibrillation;Bundle branch block (01/24 1900) Resp:  [18-19] 18 (01/25 0540) BP: (120-139)/(50-60) 129/60 (01/25 0540) SpO2:  [95 %-98 %] 97 % (01/25 0540) Weight:  [165 lb 9.6 oz (75.1 kg)] 165 lb 9.6 oz (75.1 kg) (01/25 0540)  Hemodynamic parameters for last 24 hours:    Intake/Output from previous day: 01/24 0701 - 01/25 0700 In: 840 [P.O.:840] Out: -  Intake/Output this shift: No intake/output data recorded.  General appearance: alert, cooperative and no distress Heart: irregularly irregular rhythm and no murmur.  Lungs: dim in bases Abdomen: benign Extremities: edema is improved Wound: incis healing well  Lab Results:  Recent Labs  10/21/16 0351  WBC 9.0  HGB 8.8*  HCT 26.2*  PLT 161   BMET:  Recent Labs  10/19/16 0325 10/21/16 0351  NA 139 140  K 3.5 3.7  CL 106 104  CO2 28 29  GLUCOSE 112* 101*  BUN 11 8  CREATININE 0.65 0.74  CALCIUM 8.4* 8.6*    PT/INR:  Recent Labs  10/21/16 0351  LABPROT 24.6*  INR 2.17   ABG    Component Value Date/Time   PHART 7.330 (L) 10/15/2016 0406   HCO3 25.5 10/15/2016 0406   TCO2 25 10/15/2016 1742   ACIDBASEDEF 1.0 10/15/2016 0134   O2SAT 99.0 10/15/2016 0406   CBG (last 3)  No results for input(s): GLUCAP in the last 72 hours.  Meds Scheduled Meds: . sodium chloride   Intravenous Once  . amiodarone  200 mg Oral BID PC  . aspirin EC  81 mg Oral Daily  . atenolol   12.5 mg Oral Daily  . bisacodyl  10 mg Oral Daily   Or  . bisacodyl  10 mg Rectal Daily  . docusate sodium  200 mg Oral Daily  . ezetimibe  10 mg Oral Daily  . folic acid-pyridoxine-cyancobalamin  1 tablet Oral Daily  . furosemide  40 mg Oral BID  . iron polysaccharides  150 mg Oral Daily  . magnesium oxide  200 mg Oral BID  . mometasone-formoterol  2 puff Inhalation BID  . pantoprazole  40 mg Oral Daily  . potassium chloride  30 mEq Oral BID  . rosuvastatin  5 mg Oral q1800  . sodium chloride flush  3 mL Intravenous Q12H  . sodium chloride flush  3 mL Intravenous Q12H  . warfarin  2 mg Oral q1800  . Warfarin - Physician Dosing Inpatient   Does not apply q1800   Continuous Infusions: . sodium chloride     PRN Meds:.sodium chloride, diazepam, guaiFENesin-dextromethorphan, ipratropium-albuterol, metoprolol, ondansetron (ZOFRAN) IV, oxyCODONE, sodium chloride flush, sodium chloride flush, traMADol  Xrays No results found.  Assessment/Plan: S/P Procedure(s) (LRB): REDO MITRAL VALVE REPLACEMENT (MVR) (N/A) CORONARY ARTERY BYPASS GRAFTING (CABG) (N/A) MAZE (N/A) TRANSESOPHAGEAL ECHOCARDIOGRAM (TEE) (N/A)  1 doing well 2 Rhythm unchanged , cont current rx 3 cont diuresis , may  be able to decrease lasix soon 4 cont current coumadin 5 probable d/c in am  LOS: 7 days    GOLD,WAYNE E 10/21/2016   I have seen and examined the patient and agree with the assessment and plan as outlined.  Still > 10 lbs above preop weight and legs swollen - needs diuresis.  Will give lasix IV again today.  Tentatively plan d/c home tomorrow.  Continue Coumadin 2 mg/day for now.  Will ask for PT consult to arrange for home health PT since patient remains somewhat weak.  Rexene Alberts, MD 10/21/2016 9:16 AM

## 2016-10-22 ENCOUNTER — Telehealth: Payer: Self-pay | Admitting: Family Medicine

## 2016-10-22 LAB — PROTIME-INR
INR: 2.23
Prothrombin Time: 25 seconds — ABNORMAL HIGH (ref 11.4–15.2)

## 2016-10-22 MED ORDER — ROSUVASTATIN CALCIUM 5 MG PO TABS: 5.0000 mg | ORAL_TABLET | Freq: Every day | ORAL | 1 refills | Status: DC

## 2016-10-22 MED ORDER — FUROSEMIDE 40 MG PO TABS: 40.0000 mg | ORAL_TABLET | Freq: Two times a day (BID) | ORAL | 1 refills | Status: DC

## 2016-10-22 MED ORDER — FOLIC ACID 1 MG PO TABS: 1.0000 mg | ORAL_TABLET | Freq: Every day | ORAL | 3 refills | Status: DC

## 2016-10-22 MED ORDER — OXYCODONE HCL 5 MG PO TABS: 5.0000 mg | ORAL_TABLET | Freq: Four times a day (QID) | ORAL | 0 refills | Status: DC | PRN

## 2016-10-22 MED ORDER — POTASSIUM CHLORIDE CRYS ER 15 MEQ PO TBCR: 30.0000 meq | EXTENDED_RELEASE_TABLET | Freq: Two times a day (BID) | ORAL | 1 refills | Status: DC

## 2016-10-22 MED ORDER — FERROUS SULFATE 325 (65 FE) MG PO TABS: 325.0000 mg | ORAL_TABLET | Freq: Every day | ORAL | 3 refills | Status: DC

## 2016-10-22 MED ORDER — AMIODARONE HCL 200 MG PO TABS: 200.0000 mg | ORAL_TABLET | Freq: Two times a day (BID) | ORAL | 1 refills | Status: DC

## 2016-10-22 MED ORDER — WARFARIN SODIUM 2 MG PO TABS: 2.0000 mg | ORAL_TABLET | Freq: Every day | ORAL | 1 refills | Status: DC

## 2016-10-22 MED ORDER — ASPIRIN 81 MG PO TBEC: 81.0000 mg | DELAYED_RELEASE_TABLET | Freq: Every day | ORAL | Status: DC

## 2016-10-22 NOTE — Care Management Important Message (Signed)
Important Message  Patient Details  Name: Catherine [name] MRN: [id number] Date of Birth: 26-Jul-1935   Medicare Important Message Given:  Yes    [name] [name] 10/22/2016, 2:00 PM

## 2016-10-22 NOTE — Progress Notes (Addendum)
[address]       [place] [address]             [phone]      8 Days Post-Op Procedure(s) (LRB): REDO MITRAL VALVE REPLACEMENT (MVR) (N/A) CORONARY ARTERY BYPASS GRAFTING (CABG) (N/A) MAZE (N/A) TRANSESOPHAGEAL ECHOCARDIOGRAM (TEE) (N/A) Subjective: Didn't sleep well, but overall conts to progress nicely  Objective: Vital signs in last 24 hours: Temp:  [98.1 F (36.7 C)-98.5 F (36.9 C)] 98.1 F (36.7 C) (01/26 0424) Pulse Rate:  [63-69] 69 (01/26 0424) Cardiac Rhythm: Atrial fibrillation (01/25 1900) Resp:  [18] 18 (01/26 0424) BP: (125-153)/(47) 153/47 (01/26 0424) SpO2:  [96 %-98 %] 96 % (01/26 0424) Weight:  [163 lb 11.2 oz (74.3 kg)] 163 lb 11.2 oz (74.3 kg) (01/26 0424)  Hemodynamic parameters for last 24 hours:    Intake/Output from previous day: No intake/output data recorded. Intake/Output this shift: No intake/output data recorded.  General appearance: alert, cooperative and no distress Heart: regular rate and rhythm Lungs: dim right>left base Abdomen: benign Extremities: + edema. conts to slowly improve Wound: incis healing well  Lab Results:  Recent Labs  10/21/16 0351  WBC 9.0  HGB 8.8*  HCT 26.2*  PLT 161   BMET:  Recent Labs  10/21/16 0351  NA 140  K 3.7  CL 104  CO2 29  GLUCOSE 101*  BUN 8  CREATININE 0.74  CALCIUM 8.6*    PT/INR:  Recent Labs  10/22/16 0315  LABPROT 25.0*  INR 2.23   ABG    Component Value Date/Time   PHART 7.330 (L) 10/15/2016 0406   HCO3 25.5 10/15/2016 0406   TCO2 25 10/15/2016 1742   ACIDBASEDEF 1.0 10/15/2016 0134   O2SAT 99.0 10/15/2016 0406   CBG (last 3)  No results for input(s): GLUCAP in the last 72 hours.  Meds Scheduled Meds: . sodium chloride   Intravenous Once  . amiodarone  200 mg Oral BID PC  . aspirin EC  81 mg Oral Daily  . atenolol  12.5 mg Oral Daily  . bisacodyl  10 mg Oral Daily   Or  . bisacodyl  10 mg Rectal Daily  . docusate sodium  200  mg Oral Daily  . ezetimibe  10 mg Oral Daily  . folic acid-pyridoxine-cyancobalamin  1 tablet Oral Daily  . furosemide  40 mg Oral BID  . iron polysaccharides  150 mg Oral Daily  . magnesium oxide  200 mg Oral BID  . mometasone-formoterol  2 puff Inhalation BID  . pantoprazole  40 mg Oral Daily  . potassium chloride  30 mEq Oral BID  . rosuvastatin  5 mg Oral q1800  . sodium chloride flush  3 mL Intravenous Q12H  . sodium chloride flush  3 mL Intravenous Q12H  . warfarin  2 mg Oral q1800  . Warfarin - Physician Dosing Inpatient   Does not apply q1800   Continuous Infusions: . sodium chloride     PRN Meds:.sodium chloride, diazepam, guaiFENesin-dextromethorphan, ipratropium-albuterol, metoprolol, ondansetron (ZOFRAN) IV, oxyCODONE, sodium chloride flush, sodium chloride flush, traMADol  Xrays No results found.  Assessment/Plan: S/P Procedure(s) (LRB): REDO MITRAL VALVE REPLACEMENT (MVR) (N/A) CORONARY ARTERY BYPASS GRAFTING (CABG) (N/A) MAZE (N/A) TRANSESOPHAGEAL ECHOCARDIOGRAM (TEE) (N/A)  1 doing well overall 2 cont bid diuretic short term then daily , restart triam-HCTZ with HTN as well 3 cont current coumadin dose 4 rhythm stable woth interm afib, sinus , occ missed beat 5 discharge home  today   LOS: 8 days    Catherine [name],Catherine [name] 10/22/2016

## 2016-10-22 NOTE — Progress Notes (Signed)
Physical Therapy Treatment Patient Details Name: Catherine [name] MRN: [id number] DOB: [birth year] Today's Date: 10/22/2016    History of Present Illness Patient is a 82 y/o female admitted s/p MVR and CABG x 1.  PMH positive for CHF, CKD, a-fib, valvular heart disease, recent cellulitis, HTN, CHF and CAD.    PT Comments    Pt agreed to work on transfers after gait and did review her precautions with instructions to use pillow to avoid using arms.  Pt demonstrated understanding of her instructions and gave a good return on the demonstratino including transition into the SUV.  Will transition home today with HHPT and if DC is held will continue to have PT follow acutely.  Follow Up Recommendations  Home health PT     Equipment Recommendations  None recommended by PT    Recommendations for Other Services       Precautions / Restrictions Precautions Precautions: Sternal Restrictions Weight Bearing Restrictions: Yes (sternal precautions)    Mobility  Bed Mobility Overal bed mobility: Needs Assistance Bed Mobility: Supine to Sit;Sit to Supine     Supine to sit: Min guard;Min assist Sit to supine: Min guard;Min assist   General bed mobility comments: cues with prompting for sternal precautions  Transfers Overall transfer level: Needs assistance Equipment used: Rolling walker (2 wheeled) Transfers: Sit to/from Stand;Anterior-Posterior Transfer Sit to Stand: Teacher, early years/pre transfers: Min assist   General transfer comment: replicated transfers to SUV with pt needing assistance for scooting  backward, instructed her husband for now to safely help  Ambulation/Gait Ambulation/Gait assistance: Supervision Ambulation Distance (Feet): 200 Feet Assistive device: Rolling walker (2 wheeled) Gait Pattern/deviations: Step-through pattern;Narrow base of support;Decreased stride length Gait velocity: reduced Gait velocity interpretation: Below normal speed for  age/gender General Gait Details: controlled pace with one standing rest   Stairs            Wheelchair Mobility    Modified Rankin (Stroke Patients Only)       Balance     Sitting balance-Leahy Scale: Good       Standing balance-Leahy Scale: Good                      Cognition Arousal/Alertness: Awake/alert Behavior During Therapy: WFL for tasks assessed/performed Overall Cognitive Status: Within Functional Limits for tasks assessed                      Exercises      General Comments        Pertinent Vitals/Pain Pain Assessment: Faces Pain Score: 3  Pain Location: shoulder on the R Pain Descriptors / Indicators: Sore Pain Intervention(s): Monitored during session;Repositioned    Home Living                      Prior Function            PT Goals (current goals can now be found in the care plan section) Acute Rehab PT Goals Patient Stated Goal: to get home safely    Frequency    Min 3X/week      PT Plan Current plan remains appropriate    Co-evaluation             End of Session Equipment Utilized During Treatment:  (declined belt by PT) Activity Tolerance: Patient tolerated treatment well Patient left: in chair;with call bell/phone within reach;with family/visitor present     Time: [id number] PT Time Calculation (  min) (ACUTE ONLY): 20 min  Charges:  $Gait Training: 8-22 mins                    G Codes:      Ramond Dial 10/28/16, 11:55 AM   Mee Hives, PT MS Acute Rehab Dept. Number: [place]

## 2016-10-22 NOTE — Progress Notes (Signed)
10/22/2016 1120 Discharge AVS meds taken today and those due this evening reviewed.  Follow-up appointments and when to call md reviewed.  D/C IV and TELE.  Questions and concerns addressed.   D/C home per orders. [place]

## 2016-10-22 NOTE — Telephone Encounter (Signed)
° ° ° °  Becky with [facility] would like a call back. She said she need to speak with someone before 5 pm today       [phone]

## 2016-10-22 NOTE — Care Management Note (Addendum)
Case Management Note Marvetta Gibbons RN, BSN Unit 2W-Case Manager [phone]  Patient Details  Name: ILIANA THEIL MRN: [id number] Date of Birth: 1934-12-04  Subjective/Objective:   Pt s/p CABG, MVR                 Action/Plan: PTA pt lived at home with spouse- plan to return home- per PT eval recommendation for HHPT- order has been placed- spoke with pt and spouse at bedside- choice offered for [facility] agency- they would like to use [facility] for services- referral called to Santiago Glad with [facility]- unfortunately [facility] can not accept referral- per pt and spouse- they have no preference for second agency- call made to [facility] with [facility]- who has accepted referral for HHPT. -Informed pt and spouse who are agreeable to using [facility] for services.   Expected Discharge Date:  10/22/16               Expected Discharge Plan:  [place]  In-House Referral:     Discharge planning Services  CM Consult  Post Acute Care Choice:  Home Health Choice offered to:  Patient, Spouse  DME Arranged:  N/A DME Agency:  NA  HH Arranged:  PT HH Agency:  [facility]  Status of Service:  Completed, signed off  If discussed at [place] of Stay Meetings, dates discussed:    Discharge Disposition: home with home health   Additional Comments:  Dawayne Patricia, RN 10/22/2016, 10:45 AM

## 2016-10-22 NOTE — Telephone Encounter (Signed)
Spoke with Catherine [name] and she is aware that Dr. Elease Hashimoto is her PCP and will sign home health orders.

## 2016-10-25 DIAGNOSIS — J449 Chronic obstructive pulmonary disease, unspecified: Secondary | ICD-10-CM | POA: Diagnosis not present

## 2016-10-25 DIAGNOSIS — I4891 Unspecified atrial fibrillation: Secondary | ICD-10-CM | POA: Diagnosis not present

## 2016-10-25 DIAGNOSIS — M1990 Unspecified osteoarthritis, unspecified site: Secondary | ICD-10-CM | POA: Diagnosis not present

## 2016-10-25 DIAGNOSIS — M6281 Muscle weakness (generalized): Secondary | ICD-10-CM | POA: Diagnosis not present

## 2016-10-25 DIAGNOSIS — I099 Rheumatic heart disease, unspecified: Secondary | ICD-10-CM | POA: Diagnosis not present

## 2016-10-26 ENCOUNTER — Telehealth: Payer: Self-pay | Admitting: Family Medicine

## 2016-10-26 ENCOUNTER — Telehealth: Payer: Self-pay | Admitting: Surgical

## 2016-10-26 NOTE — Telephone Encounter (Signed)
Matthew Folks with [facility] would like verbal orders for home health PT 2 wk /2 1 wk/1

## 2016-10-26 NOTE — Telephone Encounter (Signed)
OK 

## 2016-10-26 NOTE — Telephone Encounter (Signed)
Okay 

## 2016-10-26 NOTE — Telephone Encounter (Signed)
[address]       [place] [address]             [phone]          [address]       [place] [address]             [phone]    Shawn M Cottrell [id number]   S/P   OPERATIVE NOTE  Date of Procedure:                10/14/2016  Preoperative Diagnosis:        Severe Mitral Regurgitation  Severe Multi-vessel Coronary Artery Disease  Long-standing Persistent Atrial Fibrillation  Postoperative Diagnosis:    Same  Procedure:       Mitral Valve Replacement              Redo Median Sternotomy             Medtronic Mosaic Porcine Bioprosthetic Tissue Valve (size 12mm, model #310, serial [id number])   Coronary Artery Bypass Grafting x 1              Saphenous Vein Graft to Distal Right Coronary Artery             Endoscopic Vein Harvest from Right Thigh    Maze Procedure              complete bilateral atrial lesion set using bipolar radiofrequency and cryothermy ablation               Surgeon:        Valentina Gu. 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 mild mitral stenosis  Type IIIA mitral valve dysfunction  Severe bilateral atrial enlargement with extremely thin left atrial wall  Normal LV systolic function  Normal RV systolic function  Good quality SVG conduit for grafting  Good quality target vessel for grafting    Discharged 10/22/2016  Medications: Allergies as of 10/26/2016      Reactions   Aldactone [spironolactone] Other (See Comments)   dyspnea   Amoxicillin Palpitations   Tachycardia Has patient had a PCN reaction causing immediate rash, facial/tongue/throat swelling, SOB or lightheadedness with hypotension: no Has patient had a PCN reaction causing severe rash involving mucus membranes or skin necrosis: {no Has patient had a PCN reaction that required  hospitalization {no Has patient had a PCN reaction occurring within the last 10 years: {yes If all of the above answers are "NO", then may proceed with Cephalosporin use.   Diltiazem Other (See Comments)   Causing headaches   Flagyl [metronidazole Hcl] Other (See Comments)   Causing headaches   Flovent [fluticasone Propionate] Other (See Comments)   Leg cramps   Gabapentin Swelling   Lyrica [pregabalin] Swelling   Quinidine Diarrhea, Other (See Comments)   Fever diarrhea   Simvastatin Other (See Comments)   Leg pain, myalgia   Tramadol Nausea Only   Verapamil Other (See Comments)   myalgias   Ace Inhibitors Other (See Comments)   unknown   Benazepril Hcl Cough   Ciprocin-fluocin-procin [fluocinolone Acetonide] Other (See Comments)   unknown   Ciprofloxacin Diarrhea   Codeine Nausea Only   Nitrofurantoin Monohyd Macro Nausea Only      Medication List       Accurate  as of 10/26/16 11:55 AM. Always use your most recent med list.          acetaminophen 500 MG tablet Commonly known as:  TYLENOL Take 500 mg by mouth every 6 (six) hours as needed for mild pain.   amiodarone 200 MG tablet Commonly known as:  PACERONE Take 1 tablet (200 mg total) by mouth 2 (two) times daily after a meal.   aspirin 81 MG EC tablet Take 1 tablet (81 mg total) by mouth daily.   atenolol 25 MG tablet Commonly known as:  TENORMIN Take 0.5 tablets (12.5 mg total) by mouth daily.   B-complex with vitamin C tablet Take 1 tablet by mouth daily.   budesonide-formoterol 80-4.5 MCG/ACT inhaler Commonly known as:  SYMBICORT Inhale 2 puffs into the lungs 2 (two) times daily.   diazepam 5 MG tablet Commonly known as:  VALIUM Take one half tablet each morning and one at night prn anxiety   ezetimibe 10 MG tablet Commonly known as:  ZETIA Take 10 mg by mouth daily.   ferrous sulfate 325 (65 FE) MG tablet Commonly known as:  FERROUSUL Take 1 tablet (325 mg total) by mouth daily with  breakfast.   folic acid 1 MG tablet Commonly known as:  FOLVITE Take 1 tablet (1 mg total) by mouth daily.   furosemide 40 MG tablet Commonly known as:  LASIX Take 1 tablet (40 mg total) by mouth 2 (two) times daily. For 7 days then once daily   multivitamin tablet Take 1 tablet by mouth daily.   oxyCODONE 5 MG immediate release tablet Commonly known as:  Oxy IR/ROXICODONE Take 1-2 tablets (5-10 mg total) by mouth every 6 (six) hours as needed for severe pain.   potassium chloride SA 15 MEQ tablet Commonly known as:  KLOR-CON M15 Take 2 tablets (30 mEq total) by mouth 2 (two) times daily. For 7 days, then once daily   rosuvastatin 5 MG tablet Commonly known as:  CRESTOR Take 1 tablet (5 mg total) by mouth daily at 6 PM.   triamterene-hydrochlorothiazide 37.5-25 MG tablet Commonly known as:  MAXZIDE-25 Take 1 tablet by mouth daily.   warfarin 2 MG tablet Commonly known as:  COUMADIN Take 1 tablet (2 mg total) by mouth daily at 6 PM. As directed by the coumadin clinic       Coumadin:  INR check tomorrow Problems/Concerns:C/O nausea, feels may be the Iron, I told her she could stop for now.Has a poor appetite. Small amount of bleeding from the CT site- home nurse redressed    Assessment:  Patient is doing well.   Further instructions  provided.  Contact office if concerns or problems develop  Follow up Appointment: arranged   GOLD,WAYNE E, PA-C

## 2016-10-26 NOTE — Telephone Encounter (Signed)
Pt is aware of verbal orders.

## 2016-10-27 ENCOUNTER — Encounter: Payer: Self-pay | Admitting: Cardiovascular Disease

## 2016-10-27 ENCOUNTER — Ambulatory Visit (INDEPENDENT_AMBULATORY_CARE_PROVIDER_SITE_OTHER): Payer: Medicare Other | Admitting: Family Medicine

## 2016-10-27 ENCOUNTER — Ambulatory Visit (INDEPENDENT_AMBULATORY_CARE_PROVIDER_SITE_OTHER): Payer: Medicare Other | Admitting: General Practice

## 2016-10-27 ENCOUNTER — Encounter: Payer: Self-pay | Admitting: Family Medicine

## 2016-10-27 ENCOUNTER — Ambulatory Visit (INDEPENDENT_AMBULATORY_CARE_PROVIDER_SITE_OTHER): Payer: Medicare Other | Admitting: Cardiovascular Disease

## 2016-10-27 VITALS — BP 120/60 | HR 60 | Temp 98.2°F | Ht 64.0 in | Wt 153.0 lb

## 2016-10-27 VITALS — BP 126/70 | HR 60 | Ht 64.0 in | Wt 150.0 lb

## 2016-10-27 DIAGNOSIS — D649 Anemia, unspecified: Secondary | ICD-10-CM

## 2016-10-27 DIAGNOSIS — R600 Localized edema: Secondary | ICD-10-CM

## 2016-10-27 DIAGNOSIS — I480 Paroxysmal atrial fibrillation: Secondary | ICD-10-CM | POA: Diagnosis not present

## 2016-10-27 DIAGNOSIS — R531 Weakness: Secondary | ICD-10-CM | POA: Diagnosis not present

## 2016-10-27 DIAGNOSIS — Z9889 Other specified postprocedural states: Secondary | ICD-10-CM | POA: Diagnosis not present

## 2016-10-27 DIAGNOSIS — R110 Nausea: Secondary | ICD-10-CM

## 2016-10-27 DIAGNOSIS — I4891 Unspecified atrial fibrillation: Secondary | ICD-10-CM | POA: Diagnosis not present

## 2016-10-27 DIAGNOSIS — I050 Rheumatic mitral stenosis: Secondary | ICD-10-CM | POA: Diagnosis not present

## 2016-10-27 DIAGNOSIS — E7800 Pure hypercholesterolemia, unspecified: Secondary | ICD-10-CM

## 2016-10-27 DIAGNOSIS — Z953 Presence of xenogenic heart valve: Secondary | ICD-10-CM

## 2016-10-27 DIAGNOSIS — Z8679 Personal history of other diseases of the circulatory system: Secondary | ICD-10-CM

## 2016-10-27 DIAGNOSIS — I10 Essential (primary) hypertension: Secondary | ICD-10-CM

## 2016-10-27 LAB — POCT INR: INR: 3.3

## 2016-10-27 NOTE — Patient Instructions (Signed)
Medication Instructions:  DECREASE YOUR AMIODARONE TO 200 MG IN THE EVENING   Labwork: NONE  Testing/Procedures: NONE  Follow-Up: Your physician wants you to follow-up in: 3 MONTH OV  You will receive a reminder letter in the mail two months in advance. If you don't receive a letter, please call our office to schedule the follow-up appointment.  If you need a refill on your cardiac medications before your next appointment, please call your pharmacy.

## 2016-10-27 NOTE — Patient Instructions (Signed)
Pre visit review using our clinic review tool, if applicable. No additional management support is needed unless otherwise documented below in the visit note. 

## 2016-10-27 NOTE — Progress Notes (Signed)
Cardiology Office Note   Date:  10/27/2016   ID:  RONALD MODGLIN, DOB 10-26-1934, MRN [id number]  PCP:  [name], MD  Cardiologist:   [name], MD  Cardiothoracic surgeon: Dr. Roxy Manns  Chief Complaint  Patient presents with  . Follow-up     History of Present Illness: ANNET REHLING is a 82 y.o. 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 Ms. Braggs was previously a patient of [name]. She underwent mitral commissurotomy in the 1970s. On her echocardiogram 02/2014 she had an ejection fraction of 60-65% with moderate to severe mitral regurgitation. There was also mild aortic regurgitation. Her follow-up echo 02/2015 showed an EF of 60-65% with moderate to severe mitral regurgitation, moderate tricuspid regurgitation and elevated pulmonary artery pressures.  At her last clinic appointment 05/21/16 she reported progressive fatigue and shortness of breath, so she was referred for an echo 9/11/7, that was revealed moderate pulmonary stenosis with moderate to severe mitral regurgitation.  She did not have any pulmonary hypertension and her LV was not dilated.    After her last appointment Ms. Cerami was referred for left and right heart catheterization. She was found to have severe mitral stenosis with a 13 mmHg mean gradient, MVA 0.99 cm^2. She also had 50% LCx and 80% RCA lesions.  She had a 29 mm bioprosthetic mitral valve, single vessel CABG and MAZE with Dr. Roxy Manns on 10/14/16.  She did well with the operation but is still feeling very weak. She has been discouraged by this. She does note that her breathing has been improved. She thought that she would no longer need to use her inhalers but has been disappointed to find that she does still need dose inhalers. After discharge she had significant lower extremity edema. However, this continues to improve  and the edema is now just to above her ankles. She denies orthopnea or PND. She has been showing with nausea but denies abdominal pain or emesis.   Past Medical History:  Diagnosis Date  . Arthritis   . Asthma    last attack 02/2015  . Atrial fibrillation, chronic ([place])   . Cellulitis of left lower extremity 08/17/2016   Ulcer associated with severe venous insufficiency  . Chronic anticoagulation   . Chronic diastolic CHF (congestive heart failure) ([place])   . Chronic kidney disease    "RIGHT MANY KIDNEY INFECTIONS AND STONES"  . COPD (chronic obstructive pulmonary disease) ([place])   . Coronary artery disease   . Dizziness   . H/O: rheumatic fever   . Heart murmur   . Hypertension   . PONV (postoperative nausea and vomiting)    ' SOMETIMES', BUT NOT ALWAYS"  . S/P Maze operation for atrial fibrillation 10/14/2016   Complete bilateral atrial lesion set using cryothermy and bipolar radiofrequency ablation - atrial appendage was not treated due to previous surgical procedure (open mitral commissurotomy)  . S/P mitral valve replacement with bioprosthetic valve 10/14/2016   29 mm Medtronic Mosaic porcine bioprosthetic tissue valve  . UTI (urinary tract infection)   . Valvular heart disease    Has mitral stenosis with prior mitral commissurotomy in 1970    Past Surgical History:  Procedure Laterality Date  . ABDOMINAL HYSTERECTOMY  1983   endometriosis  . APPENDECTOMY    . BACK SURGERY     neurosurgery x2  . CARDIAC CATHETERIZATION    . CARDIAC  CATHETERIZATION N/A 08/03/2016   Procedure: Right/Left Heart Cath and Coronary Angiography;  Surgeon: Peter M Martinique, MD;  Location: [facility];  Service: Cardiovascular;  Laterality: N/A;  . cataract surg    . CHOLECYSTECTOMY N/A 05/30/2015   Procedure: LAPAROSCOPIC CHOLECYSTECTOMY WITH INTRAOPERATIVE CHOLANGIOGRAM;  Surgeon: Excell Seltzer, MD;  Location: ;  Service: General;  Laterality: N/A;  . COLONOSCOPY    . CORONARY ARTERY  BYPASS GRAFT N/A 10/14/2016   Procedure: CORONARY ARTERY BYPASS GRAFTING (CABG);  Surgeon: Rexene Alberts, MD;  Location: [facility];  Service: Open Heart Surgery;  Laterality: N/A;  . EYE SURGERY    . MAZE N/A 10/14/2016   Procedure: MAZE;  Surgeon: Rexene Alberts, MD;  Location: [facility];  Service: Open Heart Surgery;  Laterality: N/A;  . MITRAL VALVE REPLACEMENT N/A 10/14/2016   Procedure: REDO MITRAL VALVE REPLACEMENT (MVR);  Surgeon: Rexene Alberts, MD;  Location: [facility];  Service: Open Heart Surgery;  Laterality: N/A;  . MITRAL VALVE SURGERY Left 1970   Open mitral commissurotomy via left thoracotomy approach  . TEE WITHOUT CARDIOVERSION N/A 07/05/2016   Procedure: TRANSESOPHAGEAL ECHOCARDIOGRAM (TEE);  Surgeon: [name], MD;  Location: [facility];  Service: Cardiovascular;  Laterality: N/A;  . TEE WITHOUT CARDIOVERSION N/A 10/14/2016   Procedure: TRANSESOPHAGEAL ECHOCARDIOGRAM (TEE);  Surgeon: Rexene Alberts, MD;  Location: [facility];  Service: Open Heart Surgery;  Laterality: N/A;    Current Outpatient Prescriptions  Medication Sig Dispense Refill  . acetaminophen (TYLENOL) 500 MG tablet Take 500 mg by mouth every 6 (six) hours as needed for mild pain.    Marland Kitchen amiodarone (PACERONE) 200 MG tablet Take 200 mg by mouth every evening.    Marland Kitchen aspirin EC 81 MG EC tablet Take 1 tablet (81 mg total) by mouth daily.    Marland Kitchen atenolol (TENORMIN) 25 MG tablet Take 0.5 tablets (12.5 mg total) by mouth daily. 45 tablet 2  . B Complex-C (B-COMPLEX WITH VITAMIN C) tablet Take 1 tablet by mouth daily.    . budesonide-formoterol (SYMBICORT) 80-4.5 MCG/ACT inhaler Inhale 2 puffs into the lungs 2 (two) times daily. 1 Inhaler 11  . diazepam (VALIUM) 5 MG tablet Take one half tablet each morning and one at night prn anxiety (Patient taking differently: Take 2.5-5 mg by mouth 2 (two) times daily. Take one half tablet each morning if needed for anxiety and 1 tablet at bedtime every night.) 45 tablet 3  . ezetimibe  (ZETIA) 10 MG tablet Take 10 mg by mouth daily.    . ferrous sulfate (FERROUSUL) 325 (65 FE) MG tablet Take 1 tablet (325 mg total) by mouth daily with breakfast. 30 tablet 3  . folic acid (FOLVITE) 1 MG tablet Take 1 tablet (1 mg total) by mouth daily. 30 tablet 3  . furosemide (LASIX) 40 MG tablet Take 1 tablet (40 mg total) by mouth 2 (two) times daily. For 7 days then once daily 35 tablet 1  . Multiple Vitamin (MULTIVITAMIN) tablet Take 1 tablet by mouth daily.      . potassium chloride (KLOR-CON M15) 15 MEQ tablet Take 2 tablets (30 mEq total) by mouth 2 (two) times daily. For 7 days, then once daily 35 tablet 1  . rosuvastatin (CRESTOR) 5 MG tablet Take 1 tablet (5 mg total) by mouth daily at 6 PM. 30 tablet 1  . triamterene-hydrochlorothiazide (MAXZIDE-25) 37.5-25 MG tablet Take 1 tablet by mouth daily. 90 tablet 2  . warfarin (COUMADIN) 2 MG tablet  Take 1 tablet (2 mg total) by mouth daily at 6 PM. As directed by the coumadin clinic 100 tablet 1   No current facility-administered medications for this visit.     Allergies:   Aldactone [spironolactone]; Amoxicillin; Diltiazem; Flagyl [metronidazole hcl]; Flovent [fluticasone propionate]; Gabapentin; Lyrica [pregabalin]; Quinidine; Simvastatin; Tramadol; Verapamil; Ace inhibitors; Benazepril hcl; Ciprocin-fluocin-procin [fluocinolone acetonide]; Ciprofloxacin; Codeine; and Nitrofurantoin monohyd macro    Social History:  The patient  reports that she quit smoking about 41 years ago. Her smoking use included Cigarettes. She has a 15.00 pack-year smoking history. She has never used smokeless tobacco. She reports that she does not drink alcohol or use drugs.   Family History:  The patient's family history includes Leukemia in her father.    ROS:  Please see the history of present illness.   Otherwise, review of systems are positive for none.   All other systems are reviewed and negative.    PHYSICAL EXAM: VS:  BP 126/70   Pulse 60   Ht  5\' 4"  (1.626 m)   Wt 68 kg (150 lb)   BMI 25.75 kg/m  , BMI Body mass index is 25.75 kg/m. GENERAL:  Well appearing HEENT:  Pupils equal round and reactive, fundi not visualized, oral mucosa unremarkable NECK:  No jugular venous distention, waveform within normal limits, carotid upstroke brisk and symmetric, no bruits, no thyromegaly LYMPHATICS:  No cervical adenopathy LUNGS:  Clear to auscultation bilaterally HEART:  Regular rate and rhythm.  PMI not displaced or sustained,S1 and S2 within normal limits, no S3, no S4, no clicks, no rubs, II/VI systolic murmur throughout Chest: Healing midline incision.  Serous drainage from midline incision.  No erythema ABD:  Flat, positive bowel sounds normal in frequency in pitch, no bruits, no rebound, no guarding, no midline pulsatile mass, no hepatomegaly, no splenomegaly EXT:  2 plus pulses throughout, 2+ pitting edema to above the ankles bilaterally, no cyanosis no clubbing SKIN:  No rashes no nodules NEURO:  Cranial nerves II through XII grossly intact, motor grossly intact throughout PSYCH:  Cognitively intact, oriented to person place and time   EKG:  EKG is not ordered today. The ekg ordered 05/21/16 demonstrates atrial fibrillateion.  Prior septal infarct.  Rate 60 bpm.  Unchanged from 10/22/15. 10/27/16: Sinus rhythm.  Rate 60 bpm.  First degree heart block.   Echo 06/07/16:  Study Conclusions  - Left ventricle: The cavity size was normal. Wall thickness was   normal. Systolic function was vigorous. The estimated ejection   fraction was in the range of 65% to 70%. - Mitral valve: MV is thickened. with some restricted motion Peak   and mean gradients through the valve are 22 and 8 mm Hg   respectively MVA by Pt1/2 is 1.9 cm2. Calcified annulus. Mildly   thickened leaflets . There was moderate to severe regurgitation. - Left atrium: The atrium was massively dilated. - Right atrium: The atrium was mildly dilated.   LHC/RHC 08/03/16: The  left ventricular systolic function is normal.  LV end diastolic pressure is normal.  The left ventricular ejection fraction is 55-65% by visual estimate.  There is no aortic valve stenosis.  There is severe mitral valve stenosis.  Prox Cx to Mid Cx lesion, 50 %stenosed.  Mid RCA-2 lesion, 80 %stenosed.  Mid RCA-1 lesion, 80 %stenosed.  Hemodynamic findings consistent with mild pulmonary hypertension.  LV end diastolic pressure is normal.   1. Coronary artery disease   - 50% mid LCx   -  80% sequential lesions in the mid RCA 2. Normal LV function 3. Severe mitral stenosis. MV gradient of 13 mm Hg. MVA 0.99 cm squared with index 0.57. 4. Moderate to severe mitral insufficiency 5. Mild pulmonary HTN. 6. Normal LV EDP 7. No significant AV gradient 8. Occluded right brachial artery.  Carotid Dopplers 10/12/16: 1-39% bilateral ICA stenosis  Recent Labs: 10/12/2016: ALT 28 10/17/2016: Magnesium 2.0 10/21/2016: BUN 8; Creatinine, Ser 0.74; Hemoglobin 8.8; Platelets 161; Potassium 3.7; Sodium 140    Lipid Panel    Component Value Date/Time   CHOL 217 (H) 07/12/2016 1017   TRIG 130.0 07/12/2016 1017   HDL 59.10 07/12/2016 1017   CHOLHDL 4 07/12/2016 1017   VLDL 26.0 07/12/2016 1017   LDLCALC 132 (H) 07/12/2016 1017   LDLDIRECT 134.7 08/28/2014 1009      Wt Readings from Last 3 Encounters:  10/27/16 69.4 kg (153 lb)  10/27/16 68 kg (150 lb)  10/22/16 74.3 kg (163 lb 11.2 oz)      ASSESSMENT AND PLAN:  # Rheumatic mitral valve disease: # s/p bioprosthetic mitral valve: Ms. Weatherwax is recovering well.  Her lower extremity edema is improving.  Her shortness of breath has resolved as long as she takes her COPD medication.  She will need a baseline echo when her chest is less tender.   # Persistent Atrial fibrillation: Remains in sinus rhythm with a long first degree heart block.  She h significant nausea and I suspect that this might be related to her amiodarone. I  suggested that she take it once daily and take it in the evenings. Continue warfarin and atenolol.   # Hypertension:  Blood pressure is well-controlled on atenolol and triamterene-HCTZ.  # Hyperlipidemia: Continue rosuvastatin and ezetimibe.      Current medicines are reviewed at length with the patient today.  The patient does not have concerns regarding medicines.  The following changes have been made:  no change  Labs/ tests ordered today include:   No orders of the defined types were placed in this encounter.   Disposition:   FU with Marizol Borror C. Oval Linsey, MD, [facility] in 3 months.    This note was written with the assistance of speech recognition software.  Please excuse any transcriptional errors.  Signed, Aditya Nastasi C. Oval Linsey, MD, [facility]  10/27/2016 7:16 PM    [place]

## 2016-10-27 NOTE — Patient Instructions (Signed)
Iron-Rich Diet Introduction Iron is a mineral that helps your body to produce hemoglobin. Hemoglobin is a protein in your red blood cells that carries oxygen to your body's tissues. Eating too little iron may cause you to feel weak and tired, and it can increase your risk for infection. Eating enough iron is necessary for your body's metabolism, muscle function, and nervous system. Iron is naturally found in many foods. It can also be added to foods or fortified in foods. There are two types of dietary iron:  Heme iron. Heme iron is absorbed by the body more easily than nonheme iron. Heme iron is found in meat, poultry, and fish.  Nonheme iron. Nonheme iron is found in dietary supplements, iron-fortified grains, beans, and vegetables. You may need to follow an iron-rich diet if:  You have been diagnosed with iron deficiency or iron-deficiency anemia.  You have a condition that prevents you from absorbing dietary iron, such as:  Infection in your intestines.  Celiac disease. This involves long-lasting (chronic) inflammation of your intestines.  You do not eat enough iron.  You eat a diet that is high in foods that impair iron absorption.  You have lost a lot of blood.  You have heavy bleeding during your menstrual cycle.  You are pregnant. What is my plan? Your health care provider may help you to determine how much iron you need per day based on your condition. Generally, when a person consumes sufficient amounts of iron in the diet, the following iron needs are met:  Men.  35-41 years old: 11 mg per day.  73-46 years old: 8 mg per day.  Women.  5-74 years old: 15 mg per day.  102-56 years old: 18 mg per day.  Over 63 years old: 8 mg per day.  Pregnant women: 27 mg per day.  Breastfeeding women: 9 mg per day. What do I need to know about an iron-rich diet?  Eat fresh fruits and vegetables that are high in vitamin C along with foods that are high in iron. This will  help increase the amount of iron that your body absorbs from food, especially with foods containing nonheme iron. Foods that are high in vitamin C include oranges, peppers, tomatoes, and mango.  Take iron supplements only as directed by your health care provider. Overdose of iron can be life-threatening. If you were prescribed iron supplements, take them with orange juice or a vitamin C supplement.  Cook foods in pots and pans that are made from iron.  Eat nonheme iron-containing foods alongside foods that are high in heme iron. This helps to improve your iron absorption.  Certain foods and drinks contain compounds that impair iron absorption. Avoid eating these foods in the same meal as iron-rich foods or with iron supplements. These include:  Coffee, black tea, and red wine.  Milk, dairy products, and foods that are high in calcium.  Beans, soybeans, and peas.  Whole grains.  When eating foods that contain both nonheme iron and compounds that impair iron absorption, follow these tips to absorb iron better.  Soak beans overnight before cooking.  Soak whole grains overnight and drain them before using.  Ferment flours before baking, such as using yeast in bread dough. What foods can I eat? Grains  Iron-fortified breakfast cereal. Iron-fortified whole-wheat bread. Enriched rice. Sprouted grains. Vegetables  Spinach. Potatoes with skin. Green peas. Broccoli. Red and green bell peppers. Fermented vegetables. Fruits  Prunes. Raisins. Oranges. Strawberries. Mango. Grapefruit. Meats and Other Protein  Sources  Beef liver. Oysters. Beef. Shrimp. Turkey. Chicken. Tuna. Sardines. Chickpeas. Nuts. Tofu. Beverages  Tomato juice. Fresh orange juice. Prune juice. Hibiscus tea. Fortified instant breakfast shakes. Condiments  Tahini. Fermented soy sauce. Sweets and Desserts  Black-strap molasses. Other  Wheat germ. The items listed above may not be a complete list of recommended foods or  beverages. Contact your dietitian for more options.  What foods are not recommended? Grains  Whole grains. Bran cereal. Bran flour. Oats. Vegetables  Artichokes. Brussels sprouts. Kale. Fruits  Blueberries. Raspberries. Strawberries. Figs. Meats and Other Protein Sources  Soybeans. Products made from soy protein. Dairy  Milk. Cream. Cheese. Yogurt. Cottage cheese. Beverages  Coffee. Black tea. Red wine. Sweets and Desserts  Cocoa. Chocolate. Ice cream. Other  Basil. Oregano. Parsley. The items listed above may not be a complete list of foods and beverages to avoid. Contact your dietitian for more information.  This information is not intended to replace advice given to you by your health care provider. Make sure you discuss any questions you have with your health care provider. Document Released: 04/27/2005 Document Revised: 04/02/2016 Document Reviewed: 04/10/2014  2017 [employer]  

## 2016-10-27 NOTE — Progress Notes (Signed)
Pre visit review using our clinic review tool, if applicable. No additional management support is needed unless otherwise documented below in the visit note. 

## 2016-10-27 NOTE — Progress Notes (Signed)
Subjective:     Patient ID: Catherine [name], female   DOB: 1934-10-22, 82 y.o.   MRN: [id number]  HPI Patient seen for hospital follow-up. She has history of severe coronary artery disease, atrial fibrillation, severe mitral regurgitation related to previous rheumatic heart disease. She was admitted January 18 for mitral valve replacement and CABG. She also had Maze procedure. She was discharged on amiodarone twice daily. She's had some persistent nausea and generalized weakness but overall gradually improving. She was discharged last Friday. She has home health nurse coming out. Physical therapy pending.  Discharge hgb 8.8. She is taking iron sulfate and wonders if that may be causing some of her nausea. Mild constipation. No fevers or chills. No cough. Increased peripheral edema following discharge. She's been on furosemide 40 mg twice a day and this Friday plans to transition to once daily. Her edema is improving. Appetite has been poor. No dysuria..  She has some (as expected) chest wall soreness but gradually improving  Past Medical History:  Diagnosis Date  . Arthritis   . Asthma    last attack 02/2015  . Atrial fibrillation, chronic ([place])   . Cellulitis of left lower extremity 08/17/2016   Ulcer associated with severe venous insufficiency  . Chronic anticoagulation   . Chronic diastolic CHF (congestive heart failure) ([place])   . Chronic kidney disease    "RIGHT MANY KIDNEY INFECTIONS AND STONES"  . COPD (chronic obstructive pulmonary disease) ([place])   . Coronary artery disease   . Dizziness   . H/O: rheumatic fever   . Heart murmur   . Hypertension   . PONV (postoperative nausea and vomiting)    ' SOMETIMES', BUT NOT ALWAYS"  . S/P Maze operation for atrial fibrillation 10/14/2016   Complete bilateral atrial lesion set using cryothermy and bipolar radiofrequency ablation - atrial appendage was not treated due to previous surgical procedure (open mitral commissurotomy)  . S/P mitral  valve replacement with bioprosthetic valve 10/14/2016   29 mm Medtronic Mosaic porcine bioprosthetic tissue valve  . UTI (urinary tract infection)   . Valvular heart disease    Has mitral stenosis with prior mitral commissurotomy in 1970   Past Surgical History:  Procedure Laterality Date  . ABDOMINAL HYSTERECTOMY  1983   endometriosis  . APPENDECTOMY    . BACK SURGERY     neurosurgery x2  . CARDIAC CATHETERIZATION    . CARDIAC CATHETERIZATION N/A 08/03/2016   Procedure: Right/Left Heart Cath and Coronary Angiography;  Surgeon: Peter M Martinique, MD;  Location: [facility];  Service: Cardiovascular;  Laterality: N/A;  . cataract surg    . CHOLECYSTECTOMY N/A 05/30/2015   Procedure: LAPAROSCOPIC CHOLECYSTECTOMY WITH INTRAOPERATIVE CHOLANGIOGRAM;  Surgeon: Excell Seltzer, MD;  Location: [facility];  Service: General;  Laterality: N/A;  . COLONOSCOPY    . CORONARY ARTERY BYPASS GRAFT N/A 10/14/2016   Procedure: CORONARY ARTERY BYPASS GRAFTING (CABG);  Surgeon: Rexene Alberts, MD;  Location: [facility];  Service: Open Heart Surgery;  Laterality: N/A;  . EYE SURGERY    . MAZE N/A 10/14/2016   Procedure: MAZE;  Surgeon: Rexene Alberts, MD;  Location: [place];  Service: Open Heart Surgery;  Laterality: N/A;  . MITRAL VALVE REPLACEMENT N/A 10/14/2016   Procedure: REDO MITRAL VALVE REPLACEMENT (MVR);  Surgeon: Rexene Alberts, MD;  Location: [facility];  Service: Open Heart Surgery;  Laterality: N/A;  . MITRAL VALVE SURGERY Left 1970   Open mitral commissurotomy via left thoracotomy approach  .  TEE WITHOUT CARDIOVERSION N/A 07/05/2016   Procedure: TRANSESOPHAGEAL ECHOCARDIOGRAM (TEE);  Surgeon: [name], MD;  Location: [facility];  Service: Cardiovascular;  Laterality: N/A;  . TEE WITHOUT CARDIOVERSION N/A 10/14/2016   Procedure: TRANSESOPHAGEAL ECHOCARDIOGRAM (TEE);  Surgeon: Rexene Alberts, MD;  Location: [facility];  Service: Open Heart Surgery;  Laterality: N/A;    reports that she quit smoking  about 41 years ago. Her smoking use included Cigarettes. She has a 15.00 pack-year smoking history. She has never used smokeless tobacco. She reports that she does not drink alcohol or use drugs. family history includes Leukemia in her father. Allergies  Allergen Reactions  . Aldactone [Spironolactone] Other (See Comments)    dyspnea  . Amoxicillin Palpitations    Tachycardia Has patient had a PCN reaction causing immediate rash, facial/tongue/throat swelling, SOB or lightheadedness with hypotension: no Has patient had a PCN reaction causing severe rash involving mucus membranes or skin necrosis: {no Has patient had a PCN reaction that required hospitalization {no Has patient had a PCN reaction occurring within the last 10 years: {yes If all of the above answers are "NO", then may proceed with Cephalosporin use.  . Diltiazem Other (See Comments)    Causing headaches   . Flagyl [Metronidazole Hcl] Other (See Comments)    Causing headaches    . Flovent [Fluticasone Propionate] Other (See Comments)    Leg cramps  . Gabapentin Swelling  . Lyrica [Pregabalin] Swelling  . Quinidine Diarrhea and Other (See Comments)    Fever diarrhea  . Simvastatin Other (See Comments)    Leg pain, myalgia  . Tramadol Nausea Only  . Verapamil Other (See Comments)    myalgias  . Ace Inhibitors Other (See Comments)    unknown  . Benazepril Hcl Cough  . Ciprocin-Fluocin-Procin [Fluocinolone Acetonide] Other (See Comments)    unknown  . Ciprofloxacin Diarrhea  . Codeine Nausea Only  . Nitrofurantoin Monohyd Macro Nausea Only     Review of Systems  Constitutional: Positive for appetite change and fatigue. Negative for chills and fever.  Eyes: Negative for visual disturbance.  Respiratory: Negative for cough, chest tightness, shortness of breath and wheezing.   Cardiovascular: Positive for leg swelling. Negative for chest pain and palpitations.  Gastrointestinal: Negative for abdominal pain.   Genitourinary: Negative for dysuria.  Neurological: Positive for weakness. Negative for dizziness, seizures, syncope, light-headedness and headaches.  Psychiatric/Behavioral: Negative for confusion.       Objective:   Physical Exam  Constitutional: She is oriented to person, place, and time. She appears well-developed and well-nourished.  HENT:  Mouth/Throat: Oropharynx is clear and moist.  Cardiovascular: Normal rate and regular rhythm.   Murmur heard. Pulmonary/Chest: Effort normal and breath sounds normal. No respiratory distress. She has no wheezes. She has no rales.  Musculoskeletal:  Trace edema ankles lower legs bilaterally  Neurological: She is alert and oriented to person, place, and time.       Assessment:     #1 status post recent cardiac surgery with mitral valve replacement, CABG, Maze procedure  #2 postsurgical anemia  #3 generalized weakness  #4 history of atrial fibrillation on chronic Coumadin.  #5 nausea without vomiting.  #6 bilateral leg edema-improving.    Plan:     -Suggested dietary interventions to help with her nutrient intake. Obviously, she has to watch her intake of dark leafy greens with Coumadin -She plans to get follow-up lab work through cardiothoracic surgery -Continue home health nursing and physical therapy -Follow-up immediately for any  fever. We talked about importance of infection prevention- especially during current flu epidemic. She'll try to stay away from crowds  [name] MD [facility] at [facility]

## 2016-10-28 DIAGNOSIS — I099 Rheumatic heart disease, unspecified: Secondary | ICD-10-CM | POA: Diagnosis not present

## 2016-10-28 DIAGNOSIS — J449 Chronic obstructive pulmonary disease, unspecified: Secondary | ICD-10-CM | POA: Diagnosis not present

## 2016-10-28 DIAGNOSIS — I4891 Unspecified atrial fibrillation: Secondary | ICD-10-CM | POA: Diagnosis not present

## 2016-10-28 DIAGNOSIS — M6281 Muscle weakness (generalized): Secondary | ICD-10-CM | POA: Diagnosis not present

## 2016-10-28 DIAGNOSIS — M1990 Unspecified osteoarthritis, unspecified site: Secondary | ICD-10-CM | POA: Diagnosis not present

## 2016-11-01 ENCOUNTER — Ambulatory Visit (INDEPENDENT_AMBULATORY_CARE_PROVIDER_SITE_OTHER): Payer: Self-pay

## 2016-11-01 ENCOUNTER — Ambulatory Visit (INDEPENDENT_AMBULATORY_CARE_PROVIDER_SITE_OTHER): Payer: Medicare Other | Admitting: General Practice

## 2016-11-01 ENCOUNTER — Ambulatory Visit: Payer: Medicare Other

## 2016-11-01 DIAGNOSIS — Z4802 Encounter for removal of sutures: Secondary | ICD-10-CM

## 2016-11-01 DIAGNOSIS — Z953 Presence of xenogenic heart valve: Secondary | ICD-10-CM

## 2016-11-01 DIAGNOSIS — I4891 Unspecified atrial fibrillation: Secondary | ICD-10-CM

## 2016-11-01 LAB — POCT INR: INR: 3.5

## 2016-11-01 NOTE — Patient Instructions (Addendum)
Pre visit review using our clinic review tool, if applicable. No additional management support is needed unless otherwise documented below in the visit note. INR is high today.  Patient has had a change in dosage of amiodarone that can have effect on INR.  I held patient's dose of coumadin today and lowered about 10 percent.  Will re-check in 2 weeks.  Patient's husband accompanied her today to visit.  Patient does use a pill box and does understand the risks of a supra-therapeutic INR.    I encouraged patient to call office if she has any unusual bleeding or bruising.  Patient and husband verbalized understanding.

## 2016-11-02 DIAGNOSIS — M1990 Unspecified osteoarthritis, unspecified site: Secondary | ICD-10-CM | POA: Diagnosis not present

## 2016-11-02 DIAGNOSIS — M6281 Muscle weakness (generalized): Secondary | ICD-10-CM | POA: Diagnosis not present

## 2016-11-02 DIAGNOSIS — I4891 Unspecified atrial fibrillation: Secondary | ICD-10-CM | POA: Diagnosis not present

## 2016-11-02 DIAGNOSIS — I099 Rheumatic heart disease, unspecified: Secondary | ICD-10-CM | POA: Diagnosis not present

## 2016-11-02 DIAGNOSIS — J449 Chronic obstructive pulmonary disease, unspecified: Secondary | ICD-10-CM | POA: Diagnosis not present

## 2016-11-03 DIAGNOSIS — M6281 Muscle weakness (generalized): Secondary | ICD-10-CM | POA: Diagnosis not present

## 2016-11-03 DIAGNOSIS — I099 Rheumatic heart disease, unspecified: Secondary | ICD-10-CM | POA: Diagnosis not present

## 2016-11-03 DIAGNOSIS — M1990 Unspecified osteoarthritis, unspecified site: Secondary | ICD-10-CM | POA: Diagnosis not present

## 2016-11-03 DIAGNOSIS — J449 Chronic obstructive pulmonary disease, unspecified: Secondary | ICD-10-CM | POA: Diagnosis not present

## 2016-11-03 DIAGNOSIS — I4891 Unspecified atrial fibrillation: Secondary | ICD-10-CM | POA: Diagnosis not present

## 2016-11-04 DIAGNOSIS — I4891 Unspecified atrial fibrillation: Secondary | ICD-10-CM | POA: Diagnosis not present

## 2016-11-04 DIAGNOSIS — M1990 Unspecified osteoarthritis, unspecified site: Secondary | ICD-10-CM | POA: Diagnosis not present

## 2016-11-04 DIAGNOSIS — M6281 Muscle weakness (generalized): Secondary | ICD-10-CM | POA: Diagnosis not present

## 2016-11-04 DIAGNOSIS — I099 Rheumatic heart disease, unspecified: Secondary | ICD-10-CM | POA: Diagnosis not present

## 2016-11-04 DIAGNOSIS — J449 Chronic obstructive pulmonary disease, unspecified: Secondary | ICD-10-CM | POA: Diagnosis not present

## 2016-11-05 DIAGNOSIS — I099 Rheumatic heart disease, unspecified: Secondary | ICD-10-CM | POA: Diagnosis not present

## 2016-11-05 DIAGNOSIS — I4891 Unspecified atrial fibrillation: Secondary | ICD-10-CM | POA: Diagnosis not present

## 2016-11-05 DIAGNOSIS — M1990 Unspecified osteoarthritis, unspecified site: Secondary | ICD-10-CM | POA: Diagnosis not present

## 2016-11-05 DIAGNOSIS — J449 Chronic obstructive pulmonary disease, unspecified: Secondary | ICD-10-CM | POA: Diagnosis not present

## 2016-11-05 DIAGNOSIS — M6281 Muscle weakness (generalized): Secondary | ICD-10-CM | POA: Diagnosis not present

## 2016-11-08 DIAGNOSIS — M6281 Muscle weakness (generalized): Secondary | ICD-10-CM | POA: Diagnosis not present

## 2016-11-08 DIAGNOSIS — I4891 Unspecified atrial fibrillation: Secondary | ICD-10-CM | POA: Diagnosis not present

## 2016-11-08 DIAGNOSIS — J449 Chronic obstructive pulmonary disease, unspecified: Secondary | ICD-10-CM | POA: Diagnosis not present

## 2016-11-08 DIAGNOSIS — M1990 Unspecified osteoarthritis, unspecified site: Secondary | ICD-10-CM | POA: Diagnosis not present

## 2016-11-08 DIAGNOSIS — I099 Rheumatic heart disease, unspecified: Secondary | ICD-10-CM | POA: Diagnosis not present

## 2016-11-10 DIAGNOSIS — M1990 Unspecified osteoarthritis, unspecified site: Secondary | ICD-10-CM | POA: Diagnosis not present

## 2016-11-10 DIAGNOSIS — M6281 Muscle weakness (generalized): Secondary | ICD-10-CM | POA: Diagnosis not present

## 2016-11-10 DIAGNOSIS — I4891 Unspecified atrial fibrillation: Secondary | ICD-10-CM | POA: Diagnosis not present

## 2016-11-10 DIAGNOSIS — J449 Chronic obstructive pulmonary disease, unspecified: Secondary | ICD-10-CM | POA: Diagnosis not present

## 2016-11-10 DIAGNOSIS — I099 Rheumatic heart disease, unspecified: Secondary | ICD-10-CM | POA: Diagnosis not present

## 2016-11-12 ENCOUNTER — Ambulatory Visit (INDEPENDENT_AMBULATORY_CARE_PROVIDER_SITE_OTHER): Payer: Medicare Other | Admitting: General Practice

## 2016-11-12 DIAGNOSIS — M6281 Muscle weakness (generalized): Secondary | ICD-10-CM | POA: Diagnosis not present

## 2016-11-12 DIAGNOSIS — I4891 Unspecified atrial fibrillation: Secondary | ICD-10-CM

## 2016-11-12 DIAGNOSIS — M1990 Unspecified osteoarthritis, unspecified site: Secondary | ICD-10-CM | POA: Diagnosis not present

## 2016-11-12 DIAGNOSIS — Z5181 Encounter for therapeutic drug level monitoring: Secondary | ICD-10-CM

## 2016-11-12 DIAGNOSIS — Z7901 Long term (current) use of anticoagulants: Secondary | ICD-10-CM

## 2016-11-12 DIAGNOSIS — I099 Rheumatic heart disease, unspecified: Secondary | ICD-10-CM | POA: Diagnosis not present

## 2016-11-12 DIAGNOSIS — J449 Chronic obstructive pulmonary disease, unspecified: Secondary | ICD-10-CM | POA: Diagnosis not present

## 2016-11-12 LAB — POCT INR: INR: 2.4

## 2016-11-12 NOTE — Patient Instructions (Signed)
Pre visit review using our clinic review tool, if applicable. No additional management support is needed unless otherwise documented below in the visit note. 

## 2016-11-12 NOTE — Progress Notes (Signed)
I have reviewed and agree with the plan. 

## 2016-11-15 ENCOUNTER — Telehealth: Payer: Self-pay

## 2016-11-15 DIAGNOSIS — I4891 Unspecified atrial fibrillation: Secondary | ICD-10-CM | POA: Diagnosis not present

## 2016-11-15 DIAGNOSIS — I099 Rheumatic heart disease, unspecified: Secondary | ICD-10-CM | POA: Diagnosis not present

## 2016-11-15 DIAGNOSIS — J449 Chronic obstructive pulmonary disease, unspecified: Secondary | ICD-10-CM | POA: Diagnosis not present

## 2016-11-15 DIAGNOSIS — M1990 Unspecified osteoarthritis, unspecified site: Secondary | ICD-10-CM | POA: Diagnosis not present

## 2016-11-15 DIAGNOSIS — M6281 Muscle weakness (generalized): Secondary | ICD-10-CM | POA: Diagnosis not present

## 2016-11-15 NOTE — Telephone Encounter (Signed)
Home health nurse Inez Catalina) calling to let you know that Catherine [name] has been C/O nausea and poor appetite since hospital discharge. She stopped the amiodarone 200 mg daily over the weekend and is feeling much better. Her vital signs, heart rate are normal and incision sites all look normal. [name] to see you on 11/22/2016. Can she stay off the amiodarone? Please advise SW   Per Dr Roxy Manns it is ok to stop the Amiodarone.

## 2016-11-17 DIAGNOSIS — I4891 Unspecified atrial fibrillation: Secondary | ICD-10-CM | POA: Diagnosis not present

## 2016-11-17 DIAGNOSIS — M6281 Muscle weakness (generalized): Secondary | ICD-10-CM | POA: Diagnosis not present

## 2016-11-17 DIAGNOSIS — I099 Rheumatic heart disease, unspecified: Secondary | ICD-10-CM | POA: Diagnosis not present

## 2016-11-17 DIAGNOSIS — M1990 Unspecified osteoarthritis, unspecified site: Secondary | ICD-10-CM | POA: Diagnosis not present

## 2016-11-17 DIAGNOSIS — J449 Chronic obstructive pulmonary disease, unspecified: Secondary | ICD-10-CM | POA: Diagnosis not present

## 2016-11-19 ENCOUNTER — Other Ambulatory Visit: Payer: Self-pay | Admitting: *Deleted

## 2016-11-19 DIAGNOSIS — I4891 Unspecified atrial fibrillation: Secondary | ICD-10-CM | POA: Diagnosis not present

## 2016-11-19 DIAGNOSIS — J449 Chronic obstructive pulmonary disease, unspecified: Secondary | ICD-10-CM | POA: Diagnosis not present

## 2016-11-19 DIAGNOSIS — I099 Rheumatic heart disease, unspecified: Secondary | ICD-10-CM | POA: Diagnosis not present

## 2016-11-19 DIAGNOSIS — Z951 Presence of aortocoronary bypass graft: Secondary | ICD-10-CM

## 2016-11-19 DIAGNOSIS — Z9889 Other specified postprocedural states: Secondary | ICD-10-CM

## 2016-11-19 DIAGNOSIS — M6281 Muscle weakness (generalized): Secondary | ICD-10-CM | POA: Diagnosis not present

## 2016-11-19 DIAGNOSIS — M1990 Unspecified osteoarthritis, unspecified site: Secondary | ICD-10-CM | POA: Diagnosis not present

## 2016-11-22 ENCOUNTER — Other Ambulatory Visit: Payer: Self-pay | Admitting: *Deleted

## 2016-11-22 ENCOUNTER — Ambulatory Visit (INDEPENDENT_AMBULATORY_CARE_PROVIDER_SITE_OTHER): Payer: Self-pay | Admitting: Thoracic Surgery (Cardiothoracic Vascular Surgery)

## 2016-11-22 ENCOUNTER — Encounter: Payer: Self-pay | Admitting: Thoracic Surgery (Cardiothoracic Vascular Surgery)

## 2016-11-22 ENCOUNTER — Ambulatory Visit
Admission: RE | Admit: 2016-11-22 | Discharge: 2016-11-22 | Disposition: A | Discharge status: Home / Self Care | Payer: Medicare Other | Source: Ambulatory Visit | Attending: Thoracic Surgery (Cardiothoracic Vascular Surgery) | Admitting: Thoracic Surgery (Cardiothoracic Vascular Surgery)

## 2016-11-22 VITALS — BP 138/70 | HR 60 | Resp 20 | Ht 64.0 in | Wt 143.0 lb

## 2016-11-22 DIAGNOSIS — J90 Pleural effusion, not elsewhere classified: Secondary | ICD-10-CM | POA: Diagnosis not present

## 2016-11-22 DIAGNOSIS — Z951 Presence of aortocoronary bypass graft: Secondary | ICD-10-CM

## 2016-11-22 DIAGNOSIS — Z9889 Other specified postprocedural states: Secondary | ICD-10-CM

## 2016-11-22 DIAGNOSIS — Z953 Presence of xenogenic heart valve: Secondary | ICD-10-CM

## 2016-11-22 DIAGNOSIS — Z8679 Personal history of other diseases of the circulatory system: Secondary | ICD-10-CM

## 2016-11-22 LAB — CBC
HEMATOCRIT: 38.9 % (ref 35.0–45.0)
Hemoglobin: 12.8 g/dL (ref 11.7–15.5)
MCH: 29.4 pg (ref 27.0–33.0)
MCHC: 32.9 g/dL (ref 32.0–36.0)
MCV: 89.2 fL (ref 80.0–100.0)
MPV: 9.5 fL (ref 7.5–12.5)
Platelets: 259 10*3/uL (ref 140–400)
RBC: 4.36 MIL/uL (ref 3.80–5.10)
RDW: 14.2 % (ref 11.0–15.0)
WBC: 9.2 10*3/uL (ref 3.8–10.8)

## 2016-11-22 LAB — BASIC METABOLIC PANEL
BUN: 20 mg/dL (ref 7–25)
CO2: 36 mmol/L — AB (ref 20–31)
Calcium: 10.2 mg/dL (ref 8.6–10.4)
Chloride: 98 mmol/L (ref 98–110)
Creat: 1.16 mg/dL — ABNORMAL HIGH (ref 0.60–0.93)
Glucose, Bld: 125 mg/dL — ABNORMAL HIGH (ref 65–99)
POTASSIUM: 3.6 mmol/L (ref 3.5–5.3)
Sodium: 143 mmol/L (ref 135–146)

## 2016-11-22 NOTE — Progress Notes (Signed)
[address]       [place] [address]             [phone]     CARDIOTHORACIC SURGERY OFFICE NOTE  Referring Provider is [name], MD PCP is [name], MD   HPI:  Patient is a 82 year old female with history of rheumatic fever during childhood and long-standing history of rheumatic heart disease status post open mitral commissurotomy in 1970, long-standing persistent atrial fibrillation, chronic diastolic congestive heart failure, coronary artery disease, hypertension, and COPD who returns to the office today for routine follow-up status post mitral valve replacement using a bioprosthetic tissue valve, coronary artery bypass grafting 1, and Maze procedure on 10/14/2016.  The patient's early postoperative recovery was uncomplicated and she was ultimately discharged home on the eighth postoperative day. Since hospital discharge she has been seen in follow-up both by Dr. Oval Linsey and Dr. Elease Hashimoto.  Her Coumadin dose has been adjusted and monitored through the Coumadin clinic. Her most recent INR was measured 2.4 on 11/12/2016. She returns to our office for routine follow-up today.  She called our office last week and complained that she was still having trouble with postoperative nausea and anorexia. We suggested that she stop taking amiodarone, iron sulfate, and folic acid at that time.  She states that since then she has not noticed much improvement, but overall she is feeling better. She is not having shortness of breath. She no longer has lower extremity edema. She is sleeping much better at night. Her only complaint is that of some nausea and anorexia. Home health nursing has been tending to local wound care regarding a small chest tube incision. Home health physical therapy has signed off and recommended that she participate in the outpatient cardiac rehabilitation program.   Current Outpatient Prescriptions  Medication Sig Dispense Refill  .  acetaminophen (TYLENOL) 500 MG tablet Take 500 mg by mouth every 6 (six) hours as needed for mild pain.    Marland Kitchen amiodarone (PACERONE) 200 MG tablet Take 200 mg by mouth every evening.    Marland Kitchen aspirin EC 81 MG EC tablet Take 1 tablet (81 mg total) by mouth daily.    Marland Kitchen atenolol (TENORMIN) 25 MG tablet Take 0.5 tablets (12.5 mg total) by mouth daily. 45 tablet 2  . B Complex-C (B-COMPLEX WITH VITAMIN C) tablet Take 1 tablet by mouth daily.    . budesonide-formoterol (SYMBICORT) 80-4.5 MCG/ACT inhaler Inhale 2 puffs into the lungs 2 (two) times daily. 1 Inhaler 11  . diazepam (VALIUM) 5 MG tablet Take one half tablet each morning and one at night prn anxiety (Patient taking differently: Take 2.5-5 mg by mouth 2 (two) times daily. Take one half tablet each morning if needed for anxiety and 1 tablet at bedtime every night.) 45 tablet 3  . ezetimibe (ZETIA) 10 MG tablet Take 10 mg by mouth daily.    . ferrous sulfate (FERROUSUL) 325 (65 FE) MG tablet Take 1 tablet (325 mg total) by mouth daily with breakfast. 30 tablet 3  . folic acid (FOLVITE) 1 MG tablet Take 1 tablet (1 mg total) by mouth daily. 30 tablet 3  . furosemide (LASIX) 40 MG tablet Take 1 tablet (40 mg total) by mouth 2 (two) times daily. For 7 days then once daily 35 tablet 1  . Multiple Vitamin (MULTIVITAMIN) tablet Take 1 tablet by mouth daily.      . potassium chloride (KLOR-CON M15) 15 MEQ tablet Take 2 tablets (30  mEq total) by mouth 2 (two) times daily. For 7 days, then once daily 35 tablet 1  . rosuvastatin (CRESTOR) 5 MG tablet Take 1 tablet (5 mg total) by mouth daily at 6 PM. 30 tablet 1  . triamterene-hydrochlorothiazide (MAXZIDE-25) 37.5-25 MG tablet Take 1 tablet by mouth daily. 90 tablet 2  . warfarin (COUMADIN) 2 MG tablet Take 1 tablet (2 mg total) by mouth daily at 6 PM. As directed by the coumadin clinic 100 tablet 1   No current facility-administered medications for this visit.       Physical Exam:   BP 138/70   Pulse 60    Resp 20   Ht 5\' 4"  (1.626 m)   Wt 143 lb (64.9 kg)   SpO2 97% Comment: RA  BMI 24.55 kg/m   General:  Well-appearing  Chest:   Clear to auscultation with symmetrical breath sounds  CV:   Regular rate and rhythm without murmur  Incisions:  Healing nicely, sternum is stable. Chest tube incision has essentially completely healed.  Abdomen:  Soft nontender  Extremities:  Warm and well-perfused, no lower extremity edema  Diagnostic Tests:  2 channel telemetry rhythm strip demonstrates normal sinus rhythm   CHEST  2 VIEW  COMPARISON:  Portable chest x-ray of October 17, 2016  FINDINGS: The lungs are well-expanded. The small left pleural effusion has resolved. The right lung is clear. The heart and pulmonary vascularity are normal. The sternal wires are intact. There is calcification in the wall of the aortic arch. There is mild multilevel degenerative disc disease of the thoracic spine.  IMPRESSION: Interval resolution of the left pleural effusion. No pulmonary edema. Mild stable chronic bronchitic changes.  Thoracic aortic atherosclerosis.   Electronically Signed   By: David  Martinique M.D.   On: 11/22/2016 16:02   Impression:  Patient appears to be doing fairly well more than 4 weeks status post mitral valve replacement using a bioprosthetic tissue valve, coronary artery bypass grafting, and Maze procedure. She is currently maintaining sinus rhythm and her postoperative fluid overload has resolved. She still complains of nausea and anorexia. Because of this she recently stopped taking amiodarone, iron sulfate, and folic acid.  Plan:  We have not recommended any other changes to the patient's current medications. At some point she may be able to stop taking Lasix and potassium or switch it to only as needed for signs of fluid overload.  I have encouraged the patient to continue to gradually increase her physical activity. I think she would benefit from participation in  the outpatient cardiac rehabilitation program. I no longer feel that any sort of local wound care is necessary for her chest tube incision. All of her questions have been addressed. The patient will return to our office for follow-up and rhythm check in approximately 2 months. She will call and return sooner should specific problems or questions arise.    Valentina Gu. Roxy Manns, MD 11/22/2016 4:29 PM

## 2016-11-22 NOTE — Patient Instructions (Signed)
Continue all previous medications without any changes at this time  Continue to avoid any heavy lifting or strenuous use of your arms or shoulders for at least a total of three months from the time of surgery.  After three months you may gradually increase how much you lift or otherwise use your arms or chest as tolerated, with limits based upon whether or not activities lead to the return of significant discomfort.  You are encouraged to enroll and participate in the outpatient cardiac rehab program beginning as soon as practical.  

## 2016-11-24 DIAGNOSIS — M6281 Muscle weakness (generalized): Secondary | ICD-10-CM | POA: Diagnosis not present

## 2016-11-24 DIAGNOSIS — M1990 Unspecified osteoarthritis, unspecified site: Secondary | ICD-10-CM | POA: Diagnosis not present

## 2016-11-24 DIAGNOSIS — J449 Chronic obstructive pulmonary disease, unspecified: Secondary | ICD-10-CM | POA: Diagnosis not present

## 2016-11-24 DIAGNOSIS — I099 Rheumatic heart disease, unspecified: Secondary | ICD-10-CM | POA: Diagnosis not present

## 2016-11-24 DIAGNOSIS — I4891 Unspecified atrial fibrillation: Secondary | ICD-10-CM | POA: Diagnosis not present

## 2016-11-29 ENCOUNTER — Ambulatory Visit (INDEPENDENT_AMBULATORY_CARE_PROVIDER_SITE_OTHER): Payer: Medicare Other | Admitting: General Practice

## 2016-11-29 DIAGNOSIS — I4891 Unspecified atrial fibrillation: Secondary | ICD-10-CM

## 2016-11-29 DIAGNOSIS — Z7901 Long term (current) use of anticoagulants: Secondary | ICD-10-CM

## 2016-11-29 DIAGNOSIS — Z5181 Encounter for therapeutic drug level monitoring: Secondary | ICD-10-CM

## 2016-11-29 LAB — POCT INR: INR: 3.3

## 2016-11-29 NOTE — Patient Instructions (Signed)
Pre visit review using our clinic review tool, if applicable. No additional management support is needed unless otherwise documented below in the visit note. 

## 2016-12-15 ENCOUNTER — Other Ambulatory Visit: Payer: Self-pay | Admitting: *Deleted

## 2016-12-15 MED ORDER — ROSUVASTATIN CALCIUM 5 MG PO TABS: 5.0000 mg | ORAL_TABLET | Freq: Every day | ORAL | 1 refills | Status: DC

## 2016-12-15 MED ORDER — FUROSEMIDE 40 MG PO TABS: 40.0000 mg | ORAL_TABLET | Freq: Every day | ORAL | 1 refills | Status: DC

## 2016-12-20 ENCOUNTER — Encounter: Payer: Self-pay | Admitting: Family Medicine

## 2016-12-20 ENCOUNTER — Ambulatory Visit (INDEPENDENT_AMBULATORY_CARE_PROVIDER_SITE_OTHER): Payer: Medicare Other | Admitting: General Practice

## 2016-12-20 ENCOUNTER — Ambulatory Visit (INDEPENDENT_AMBULATORY_CARE_PROVIDER_SITE_OTHER): Payer: Medicare Other | Admitting: Family Medicine

## 2016-12-20 VITALS — BP 110/70 | HR 60 | Temp 98.3°F | Wt 147.0 lb

## 2016-12-20 DIAGNOSIS — I4891 Unspecified atrial fibrillation: Secondary | ICD-10-CM

## 2016-12-20 DIAGNOSIS — R600 Localized edema: Secondary | ICD-10-CM | POA: Diagnosis not present

## 2016-12-20 DIAGNOSIS — I10 Essential (primary) hypertension: Secondary | ICD-10-CM

## 2016-12-20 DIAGNOSIS — Z5181 Encounter for therapeutic drug level monitoring: Secondary | ICD-10-CM

## 2016-12-20 DIAGNOSIS — E785 Hyperlipidemia, unspecified: Secondary | ICD-10-CM | POA: Insufficient documentation

## 2016-12-20 DIAGNOSIS — Z7901 Long term (current) use of anticoagulants: Secondary | ICD-10-CM

## 2016-12-20 LAB — LIPID PANEL
CHOL/HDL RATIO: 3
CHOLESTEROL: 158 mg/dL (ref 0–200)
HDL: 60 mg/dL (ref >39.00)
LDL CALC: 61 mg/dL (ref 0–99)
NONHDL: 97.63
Triglycerides: 184 mg/dL — ABNORMAL HIGH (ref 0.0–149.0)
VLDL: 36.8 mg/dL (ref 0.0–40.0)

## 2016-12-20 LAB — BASIC METABOLIC PANEL
BUN: 14 mg/dL (ref 6–23)
CO2: 36 mEq/L — ABNORMAL HIGH (ref 19–32)
Calcium: 10.1 mg/dL (ref 8.4–10.5)
Chloride: 100 mEq/L (ref 96–112)
Creatinine, Ser: 0.91 mg/dL (ref 0.40–1.20)
GFR: 63.31 mL/min (ref >60.00)
GLUCOSE: 101 mg/dL — AB (ref 70–99)
Potassium: 3.1 mEq/L — ABNORMAL LOW (ref 3.5–5.1)
Sodium: 145 mEq/L (ref 135–145)

## 2016-12-20 LAB — HEPATIC FUNCTION PANEL
ALK PHOS: 68 U/L (ref 39–117)
ALT: 17 U/L (ref 0–35)
AST: 27 U/L (ref 0–37)
Albumin: 4.8 g/dL (ref 3.5–5.2)
BILIRUBIN TOTAL: 0.6 mg/dL (ref 0.2–1.2)
Bilirubin, Direct: 0.1 mg/dL (ref 0.0–0.3)
Total Protein: 7.3 g/dL (ref 6.0–8.3)

## 2016-12-20 LAB — POCT INR: INR: 3.5

## 2016-12-20 NOTE — Progress Notes (Signed)
Subjective:     Patient ID: Catherine [name], female   DOB: 1935/04/23, 82 y.o.   MRN: [id number]  HPI Patient seen for routine medical follow-up. She had recent bypass and aortic valve surgery and has done well since then other than having some general fatigue. She is getting ready to start cardiac rehabilitation program at [facility] in April 19. She feels relatively stable overall. Her appetite has improved somewhat. No peripheral edema. Daily body weights reviewed and very stable. She is currently taking furosemide 40 mg once daily. Recent labs per vascular surgery reviewed. Her hemoglobin is back to normal. Creatinine was slightly up.  She's having much less dyspnea since her surgery. No recent chest pains. No peripheral edema.  Was recently started on Crestor during her hospitalization. Denies any myalgias. She's not had any follow-up lipids since then. Lipids had been poorly controlled in the past.  Past Medical History:  Diagnosis Date  . Arthritis   . Asthma    last attack 02/2015  . Atrial fibrillation, chronic ([place])   . Cellulitis of left lower extremity 08/17/2016   Ulcer associated with severe venous insufficiency  . Chronic anticoagulation   . Chronic diastolic CHF (congestive heart failure) ([place])   . Chronic kidney disease    "RIGHT MANY KIDNEY INFECTIONS AND STONES"  . COPD (chronic obstructive pulmonary disease) ([place])   . Coronary artery disease   . Dizziness   . H/O: rheumatic fever   . Heart murmur   . Hypertension   . PONV (postoperative nausea and vomiting)    ' SOMETIMES', BUT NOT ALWAYS"  . S/P Maze operation for atrial fibrillation 10/14/2016   Complete bilateral atrial lesion set using cryothermy and bipolar radiofrequency ablation - atrial appendage was not treated due to previous surgical procedure (open mitral commissurotomy)  . S/P mitral valve replacement with bioprosthetic valve 10/14/2016   29 mm Medtronic Mosaic porcine bioprosthetic tissue valve  . UTI  (urinary tract infection)   . Valvular heart disease    Has mitral stenosis with prior mitral commissurotomy in 1970   Past Surgical History:  Procedure Laterality Date  . ABDOMINAL HYSTERECTOMY  1983   endometriosis  . APPENDECTOMY    . BACK SURGERY     neurosurgery x2  . CARDIAC CATHETERIZATION    . CARDIAC CATHETERIZATION N/A 08/03/2016   Procedure: Right/Left Heart Cath and Coronary Angiography;  Surgeon: Peter M Martinique, MD;  Location: [facility];  Service: Cardiovascular;  Laterality: N/A;  . cataract surg    . CHOLECYSTECTOMY N/A 05/30/2015   Procedure: LAPAROSCOPIC CHOLECYSTECTOMY WITH INTRAOPERATIVE CHOLANGIOGRAM;  Surgeon: Excell Seltzer, MD;  Location: [place];  Service: General;  Laterality: N/A;  . COLONOSCOPY    . CORONARY ARTERY BYPASS GRAFT N/A 10/14/2016   Procedure: CORONARY ARTERY BYPASS GRAFTING (CABG);  Surgeon: Rexene Alberts, MD;  Location: [place];  Service: Open Heart Surgery;  Laterality: N/A;  . EYE SURGERY    . MAZE N/A 10/14/2016   Procedure: MAZE;  Surgeon: Rexene Alberts, MD;  Location: [facility];  Service: Open Heart Surgery;  Laterality: N/A;  . MITRAL VALVE REPLACEMENT N/A 10/14/2016   Procedure: REDO MITRAL VALVE REPLACEMENT (MVR);  Surgeon: Rexene Alberts, MD;  Location: [facility];  Service: Open Heart Surgery;  Laterality: N/A;  . MITRAL VALVE SURGERY Left 1970   Open mitral commissurotomy via left thoracotomy approach  . TEE WITHOUT CARDIOVERSION N/A 07/05/2016   Procedure: TRANSESOPHAGEAL ECHOCARDIOGRAM (TEE);  Surgeon: [name], MD;  Location: [facility];  Service: Cardiovascular;  Laterality: N/A;  . TEE WITHOUT CARDIOVERSION N/A 10/14/2016   Procedure: TRANSESOPHAGEAL ECHOCARDIOGRAM (TEE);  Surgeon: Rexene Alberts, MD;  Location: [facility];  Service: Open Heart Surgery;  Laterality: N/A;    reports that she quit smoking about 41 years ago. Her smoking use included Cigarettes. She has a 15.00 pack-year smoking history. She has never used  smokeless tobacco. She reports that she does not drink alcohol or use drugs. family history includes Leukemia in her father. Allergies  Allergen Reactions  . Aldactone [Spironolactone] Other (See Comments)    dyspnea  . Amoxicillin Palpitations    Tachycardia Has patient had a PCN reaction causing immediate rash, facial/tongue/throat swelling, SOB or lightheadedness with hypotension: no Has patient had a PCN reaction causing severe rash involving mucus membranes or skin necrosis: {no Has patient had a PCN reaction that required hospitalization {no Has patient had a PCN reaction occurring within the last 10 years: {yes If all of the above answers are "NO", then may proceed with Cephalosporin use.  . Diltiazem Other (See Comments)    Causing headaches   . Flagyl [Metronidazole Hcl] Other (See Comments)    Causing headaches    . Flovent [Fluticasone Propionate] Other (See Comments)    Leg cramps  . Gabapentin Swelling  . Lyrica [Pregabalin] Swelling  . Quinidine Diarrhea and Other (See Comments)    Fever diarrhea  . Simvastatin Other (See Comments)    Leg pain, myalgia  . Tramadol Nausea Only  . Verapamil Other (See Comments)    myalgias  . Ace Inhibitors Other (See Comments)    unknown  . Benazepril Hcl Cough  . Ciprocin-Fluocin-Procin [Fluocinolone Acetonide] Other (See Comments)    unknown  . Ciprofloxacin Diarrhea  . Codeine Nausea Only  . Nitrofurantoin Monohyd Macro Nausea Only     Review of Systems  Constitutional: Positive for fatigue. Negative for chills, fever and unexpected weight change.  Eyes: Negative for visual disturbance.  Respiratory: Negative for cough, chest tightness, shortness of breath and wheezing.   Cardiovascular: Negative for chest pain, palpitations and leg swelling.  Genitourinary: Negative for dysuria.  Neurological: Negative for dizziness, seizures, syncope, weakness, light-headedness and headaches.       Objective:   Physical Exam   Constitutional: She appears well-developed and well-nourished.  Eyes: Pupils are equal, round, and reactive to light.  Neck: Neck supple. No JVD present. No thyromegaly present.  Cardiovascular: Regular rhythm.  Exam reveals no gallop.   Slightly bradycardic but regular with heart rate 48-52  Pulmonary/Chest: Effort normal and breath sounds normal. No respiratory distress. She has no wheezes. She has no rales.  Musculoskeletal: She exhibits no edema.  Neurological: She is alert.       Assessment:     #1 history of CAD with recent CABG and aortic valve surgery clinically improved following surgery  #2 history of peripheral edema stable  #3 dyslipidemia    Plan:     -Check lipid and hepatic panel -Continue daily weights -She'll try reducing her furosemide to 20 mg daily and continue daily potassium supplement -Recheck basic metabolic panel today -Routine follow-up in 2 months and sooner as needed  [name] MD [facility] at [facility]

## 2016-12-20 NOTE — Progress Notes (Signed)
Pre visit review using our clinic review tool, if applicable. No additional management support is needed unless otherwise documented below in the visit note. 

## 2016-12-20 NOTE — Patient Instructions (Signed)
Pre visit review using our clinic review tool, if applicable. No additional management support is needed unless otherwise documented below in the visit note. 

## 2016-12-20 NOTE — Patient Instructions (Addendum)

## 2016-12-23 ENCOUNTER — Encounter (HOSPITAL_COMMUNITY): Payer: Self-pay

## 2016-12-23 ENCOUNTER — Encounter (HOSPITAL_COMMUNITY)
Admission: RE | Admit: 2016-12-23 | Discharge: 2016-12-23 | Disposition: A | Discharge status: Home / Self Care | Payer: Medicare Other | Source: Ambulatory Visit | Attending: Cardiovascular Disease | Admitting: Cardiovascular Disease

## 2016-12-23 VITALS — BP 120/56 | HR 53 | Ht 63.0 in | Wt 152.3 lb

## 2016-12-23 DIAGNOSIS — Z8679 Personal history of other diseases of the circulatory system: Secondary | ICD-10-CM | POA: Diagnosis not present

## 2016-12-23 DIAGNOSIS — Z951 Presence of aortocoronary bypass graft: Secondary | ICD-10-CM | POA: Diagnosis not present

## 2016-12-23 DIAGNOSIS — Z953 Presence of xenogenic heart valve: Secondary | ICD-10-CM | POA: Diagnosis not present

## 2016-12-23 DIAGNOSIS — Z48812 Encounter for surgical aftercare following surgery on the circulatory system: Secondary | ICD-10-CM | POA: Insufficient documentation

## 2016-12-23 DIAGNOSIS — Z9889 Other specified postprocedural states: Secondary | ICD-10-CM | POA: Diagnosis not present

## 2016-12-23 NOTE — Progress Notes (Signed)
Cardiac/Pulmonary Rehab Medication Review by a Pharmacist  Does the patient  feel that his/her medications are working for him/her?  yes  Has the patient been experiencing any side effects to the medications prescribed?  No, sometime pill burden makes them difficult to swallow.  Does the patient measure his/her own blood pressure or blood glucose at home?  No, occasional blood pressure  Does the patient have any problems obtaining medications due to transportation or finances?   no  Understanding of regimen: good Understanding of indications: good Potential of compliance: excellent  Pharmacist comments: Good understanding, patient happy that many medications have recently been stopped or reduced.  Pricilla Larsson 12/23/2016 9:26 AM

## 2016-12-23 NOTE — Progress Notes (Signed)
Cardiac Individual Treatment Plan  Patient Details  Name: Catherine [name] MRN: [id number] Date of Birth: 08/04/1935 Referring Provider:     CARDIAC REHAB PHASE II ORIENTATION from 12/23/2016 in [place]  Referring Provider  Dr. Oval Linsey      Initial Encounter Date:    CARDIAC REHAB PHASE II ORIENTATION from 12/23/2016 in [place]  Date  12/23/16  Referring Provider  Dr. Oval Linsey      Visit Diagnosis: Status post mitral valve replacement with bioprosthetic valve  S/P CABG x 1  Patient's Home Medications on Admission:  Current Outpatient Prescriptions:  .  acetaminophen (TYLENOL) 500 MG tablet, Take 500 mg by mouth every 6 (six) hours as needed for mild pain., Disp: , Rfl:  .  aspirin EC 81 MG EC tablet, Take 1 tablet (81 mg total) by mouth daily., Disp: , Rfl:  .  atenolol (TENORMIN) 25 MG tablet, Take 0.5 tablets (12.5 mg total) by mouth daily., Disp: 45 tablet, Rfl: 2 .  B Complex-C (B-COMPLEX WITH VITAMIN C) tablet, Take 1 tablet by mouth daily., Disp: , Rfl:  .  diazepam (VALIUM) 5 MG tablet, Take one half tablet each morning and one at night prn anxiety (Patient taking differently: Take 2.5-5 mg by mouth 2 (two) times daily. Take one half tablet each morning if needed for anxiety and 1 tablet at bedtime every night.), Disp: 45 tablet, Rfl: 3 .  furosemide (LASIX) 40 MG tablet, Take 20 mg by mouth. Take one half tablet daily, Disp: , Rfl:  .  Multiple Vitamin (MULTIVITAMIN) tablet, Take 1 tablet by mouth daily.  , Disp: , Rfl:  .  OVER THE COUNTER MEDICATION, Apply 1 application topically at bedtime as needed (Leg Pain). Magni-Life topical cream for leg pain., Disp: , Rfl:  .  potassium chloride (K-DUR,KLOR-CON) 10 MEQ tablet, Take 10 mEq by mouth daily., Disp: , Rfl:  .  rosuvastatin (CRESTOR) 5 MG tablet, Take 1 tablet (5 mg total) by mouth daily at 6 PM., Disp: 90 tablet, Rfl: 1 .  triamterene-hydrochlorothiazide (MAXZIDE-25)  37.5-25 MG tablet, Take 1 tablet by mouth daily., Disp: 90 tablet, Rfl: 2 .  budesonide-formoterol (SYMBICORT) 80-4.5 MCG/ACT inhaler, Inhale 2 puffs into the lungs 2 (two) times daily. (Patient not taking: Reported on 12/20/2016), Disp: 1 Inhaler, Rfl: 11 .  warfarin (COUMADIN) 2 MG tablet, Take 1 tablet (2 mg total) by mouth daily at 6 PM. As directed by the coumadin clinic (Patient taking differently: Take 1-2 mg by mouth daily at 6 PM. As directed by the coumadin clinic,  Currently 1 tablet daily except 1/2 tablet on Monday - Wednesday - Friday.), Disp: 100 tablet, Rfl: 1  Past Medical History: Past Medical History:  Diagnosis Date  . Arthritis   . Asthma    last attack 02/2015  . Atrial fibrillation, chronic ([place])   . Cellulitis of left lower extremity 08/17/2016   Ulcer associated with severe venous insufficiency  . Chronic anticoagulation   . Chronic diastolic CHF (congestive heart failure) ([place])   . Chronic kidney disease    "RIGHT MANY KIDNEY INFECTIONS AND STONES"  . COPD (chronic obstructive pulmonary disease) ([place])   . Coronary artery disease   . Dizziness   . H/O: rheumatic fever   . Heart murmur   . Hypertension   . PONV (postoperative nausea and vomiting)    ' SOMETIMES', BUT NOT ALWAYS"  . S/P Maze operation for atrial fibrillation 10/14/2016   Complete bilateral atrial lesion  set using cryothermy and bipolar radiofrequency ablation - atrial appendage was not treated due to previous surgical procedure (open mitral commissurotomy)  . S/P mitral valve replacement with bioprosthetic valve 10/14/2016   29 mm Medtronic Mosaic porcine bioprosthetic tissue valve  . UTI (urinary tract infection)   . Valvular heart disease    Has mitral stenosis with prior mitral commissurotomy in 1970    Tobacco Use: History  Smoking Status  . Former Smoker  . Packs/day: 1.00  . Years: 15.00  . Types: Cigarettes  . Quit date: 09/28/1975  Smokeless Tobacco  . Never Used    Labs: Recent  Review Flowsheet Data    Labs for ITP Cardiac and Pulmonary Rehab Latest Ref Rng & Units 10/15/2016 10/15/2016 10/15/2016 10/15/2016 12/20/2016   Cholestrol 0 - 200 mg/dL - - - - 158   LDLCALC 0 - 99 mg/dL - - - - 61   LDLDIRECT mg/dL - - - - -   HDL >39.00 mg/dL - - - - 60.00   Trlycerides 0.0 - 149.0 mg/dL - - - - 184.0(H)   Hemoglobin A1c 4.8 - 5.6 % - - - - -   PHART 7.350 - 7.450 7.344(L) 7.347(L) 7.330(L) - -   PCO2ART 32.0 - 48.0 mmHg 48.2(H) 44.8 48.7(H) - -   HCO3 20.0 - 28.0 mmol/L 26.0 24.4 25.5 - -   TCO2 0 - 100 mmol/L 27 26 27 25  -   ACIDBASEDEF 0.0 - 2.0 mmol/L - 1.0 - - -   O2SAT % 99.0 99.0 99.0 - -      Capillary Blood Glucose: Lab Results  Component Value Date   GLUCAP 138 (H) 10/16/2016   GLUCAP 117 (H) 10/16/2016   GLUCAP 108 (H) 10/16/2016   GLUCAP 107 (H) 10/16/2016   GLUCAP 106 (H) 10/16/2016     Exercise Target Goals: Date: 12/23/16  Exercise Program Goal: Individual exercise prescription set with THRR, safety & activity barriers. Participant demonstrates ability to understand and report RPE using BORG scale, to self-measure pulse accurately, and to acknowledge the importance of the exercise prescription.  Exercise Prescription Goal: Starting with aerobic activity 30 plus minutes a day, 3 days per week for initial exercise prescription. Provide home exercise prescription and guidelines that participant acknowledges understanding prior to discharge.  Activity Barriers & Risk Stratification:   6 Minute Walk:     6 Minute Walk    Row Name 12/23/16 1108         6 Minute Walk   Phase Initial     Distance 1200 feet     Distance % Change 0 %     Walk Time 6 minutes     # of Rest Breaks 0     MPH 2.27     METS 2.74     RPE 13     Perceived Dyspnea  13     VO2 Peak 7.01     Symptoms No     Resting HR 53 bpm     Resting BP 120/56     Max Ex. HR 71 bpm     Max Ex. BP 150/66     2 Minute Post BP 126/60        Oxygen Initial Assessment:      Oxygen Initial Assessment - 12/23/16 1047      Home Oxygen   Home Oxygen Device None   Sleep Oxygen Prescription None   Home Exercise Oxygen Prescription None   Home at Rest Exercise Oxygen Prescription  None     Initial 6 min Walk   Oxygen Used None     Program Oxygen Prescription   Program Oxygen Prescription None      Oxygen Re-Evaluation:   Oxygen Discharge (Final Oxygen Re-Evaluation):   Initial Exercise Prescription:     Initial Exercise Prescription - 12/23/16 1100      Date of Initial Exercise RX and Referring Provider   Date 12/23/16   Referring Provider Dr. Oval Linsey     Treadmill   MPH 2   Grade 0   Minutes 15   METs 2.5     NuStep   Level 2   SPM 17   Minutes 20   METs 1.9     Prescription Details   Frequency (times per week) 3   Duration Progress to 30 minutes of continuous aerobic without signs/symptoms of physical distress     Intensity   THRR 40-80% of Max Heartrate [phone]   Ratings of Perceived Exertion 11-13   Perceived Dyspnea 0-4     Progression   Progression Continue progressive overload as per policy without signs/symptoms or physical distress.     Resistance Training   Training Prescription Yes   Weight 1   Reps 10-15      Perform Capillary Blood Glucose checks as needed.  Exercise Prescription Changes:   Exercise Comments:   Exercise Goals and Review:      Exercise Goals    Row Name 12/23/16 1047             Exercise Goals   Increase Physical Activity Yes       Intervention Provide advice, education, support and counseling about physical activity/exercise needs.;Develop an individualized exercise prescription for aerobic and resistive training based on initial evaluation findings, risk stratification, comorbidities and participant's personal goals.       Expected Outcomes Achievement of increased cardiorespiratory fitness and enhanced flexibility, muscular endurance and strength shown through measurements  of functional capacity and personal statement of participant.       Increase Strength and Stamina Yes       Intervention Provide advice, education, support and counseling about physical activity/exercise needs.;Develop an individualized exercise prescription for aerobic and resistive training based on initial evaluation findings, risk stratification, comorbidities and participant's personal goals.       Expected Outcomes Achievement of increased cardiorespiratory fitness and enhanced flexibility, muscular endurance and strength shown through measurements of functional capacity and personal statement of participant.          Exercise Goals Re-Evaluation :    Discharge Exercise Prescription (Final Exercise Prescription Changes):   Nutrition:  Target Goals: Understanding of nutrition guidelines, daily intake of sodium 1500mg , cholesterol 200mg , calories 30% from fat and 7% or less from saturated fats, daily to have 5 or more servings of fruits and vegetables.  Biometrics:     Pre Biometrics - 12/23/16 1111      Pre Biometrics   Height 5\' 3"  (1.6 m)   Weight 152 lb 5.4 oz (69.1 kg)   Waist Circumference 35 inches   Hip Circumference 38 inches   Waist to Hip Ratio 0.92 %   BMI (Calculated) 27   Triceps Skinfold 18 mm   % Body Fat 37.3 %   Grip Strength 43 kg   Flexibility 0 in   Single Leg Stand 5 seconds       Nutrition Therapy Plan and Nutrition Goals:   Nutrition Discharge: Rate Your Plate Scores:     Nutrition Assessments -  12/23/16 1048      MEDFICTS Scores   Pre Score 50      Nutrition Goals Re-Evaluation:   Nutrition Goals Discharge (Final Nutrition Goals Re-Evaluation):   Psychosocial: Target Goals: Acknowledge presence or absence of significant depression and/or stress, maximize coping skills, provide positive support system. Participant is able to verbalize types and ability to use techniques and skills needed for reducing stress and  depression.  Initial Review & Psychosocial Screening:     Initial Psych Review & Screening - 12/23/16 1119      Initial Review   Current issues with Current Depression;Current Sleep Concerns     Family Dynamics   Good Support System? Yes     Barriers   Psychosocial barriers to participate in program The patient should benefit from training in stress management and relaxation.;Psychosocial barriers identified (see note)  Patient's QOL score was 22.93 and her PHQ-9 score was 11 indicating some depression.      Screening Interventions   Interventions Encouraged to exercise  Patient says her depressed feeling started after her surgery and is related to her lack of energy to do anything. She feels she will improve when she feels more like doing things.       Quality of Life Scores:     Quality of Life - 12/23/16 1112      Quality of Life Scores   Health/Function Pre 20.3 %   Socioeconomic Pre 25.5 %   Psych/Spiritual Pre 24.86 %   Family Pre 24 %   GLOBAL Pre 22.93 %      PHQ-9: Recent Review Flowsheet Data    Depression screen [facility] 2/9 12/23/2016 12/20/2016 11/21/2015 12/30/2014 12/05/2013   Decreased Interest 0 0 0 0 0   Down, Depressed, Hopeless 2 1  0 0 0   PHQ - 2 Score 2 1 0 0 0   Altered sleeping 3 - - - -   Tired, decreased energy 3 - - - -   Change in appetite 0 - - - -   Feeling bad or failure about yourself  2 - - - -   Trouble concentrating 1 - - - -   Moving slowly or fidgety/restless 0 - - - -   Suicidal thoughts 0 - - - -   PHQ-9 Score 11 - - - -   Difficult doing work/chores Somewhat difficult - - - -     Interpretation of Total Score  Total Score Depression Severity:  1-4 = Minimal depression, 5-9 = Mild depression, 10-14 = Moderate depression, 15-19 = Moderately severe depression, 20-27 = Severe depression   Psychosocial Evaluation and Intervention:     Psychosocial Evaluation - 12/23/16 1122      Psychosocial Evaluation & Interventions    Interventions Stress management education;Relaxation education;Encouraged to exercise with the program and follow exercise prescription   Comments Patient says her depressed feeling started after her surgery and is related to her lack of energy to do a lot.    Expected Outcomes Patient will complete the program meeting her goal of improved energy which will improve her depressed feeling.    Continue Psychosocial Services  Follow up required by staff      Psychosocial Re-Evaluation:   Psychosocial Discharge (Final Psychosocial Re-Evaluation):   Vocational Rehabilitation: Provide vocational rehab assistance to qualifying candidates.   Vocational Rehab Evaluation & Intervention:     Vocational Rehab - 12/23/16 1046      Initial Vocational Rehab Evaluation & Intervention   Assessment  shows need for Vocational Rehabilitation No      Education: Education Goals: Education classes will be provided on a weekly basis, covering required topics. Participant will state understanding/return demonstration of topics presented.  Learning Barriers/Preferences:     Learning Barriers/Preferences - 12/23/16 1045      Learning Barriers/Preferences   Learning Barriers None   Learning Preferences Skilled Demonstration;Audio;Individual Instruction;Verbal Instruction      Education Topics: Hypertension, Hypertension Reduction -Define heart disease and high blood pressure. Discus how high blood pressure affects the body and ways to reduce high blood pressure.   Exercise and Your Heart -Discuss why it is important to exercise, the FITT principles of exercise, normal and abnormal responses to exercise, and how to exercise safely.   Angina -Discuss definition of angina, causes of angina, treatment of angina, and how to decrease risk of having angina.   Cardiac Medications -Review what the following cardiac medications are used for, how they affect the body, and side effects that may occur  when taking the medications.  Medications include Aspirin, Beta blockers, calcium channel blockers, ACE Inhibitors, angiotensin receptor blockers, diuretics, digoxin, and antihyperlipidemics.   Congestive Heart Failure -Discuss the definition of CHF, how to live with CHF, the signs and symptoms of CHF, and how keep track of weight and sodium intake.   Heart Disease and Intimacy -Discus the effect sexual activity has on the heart, how changes occur during intimacy as we age, and safety during sexual activity.   Smoking Cessation / COPD -Discuss different methods to quit smoking, the health benefits of quitting smoking, and the definition of COPD.   Nutrition I: Fats -Discuss the types of cholesterol, what cholesterol does to the heart, and how cholesterol levels can be controlled.   Nutrition II: Labels -Discuss the different components of food labels and how to read food label   Heart Parts and Heart Disease -Discuss the anatomy of the heart, the pathway of blood circulation through the heart, and these are affected by heart disease.   Stress I: Signs and Symptoms -Discuss the causes of stress, how stress may lead to anxiety and depression, and ways to limit stress.   Stress II: Relaxation -Discuss different types of relaxation techniques to limit stress.   Warning Signs of Stroke / TIA -Discuss definition of a stroke, what the signs and symptoms are of a stroke, and how to identify when someone is having stroke.   Knowledge Questionnaire Score:     Knowledge Questionnaire Score - 12/23/16 1046      Knowledge Questionnaire Score   Pre Score 21/28      Core Components/Risk Factors/Patient Goals at Admission:     Personal Goals and Risk Factors at Admission - 12/23/16 1048      Core Components/Risk Factors/Patient Goals on Admission    Weight Management Yes   Intervention Weight Management: Develop a combined nutrition and exercise program designed to reach  desired caloric intake, while maintaining appropriate intake of nutrient and fiber, sodium and fats, and appropriate energy expenditure required for the weight goal.;Weight Management: Provide education and appropriate resources to help participant work on and attain dietary goals.;Weight Management/Obesity: Establish reasonable short term and long term weight goals.;Obesity: Provide education and appropriate resources to help participant work on and attain dietary goals.   Admit Weight 152 lb 6.4 oz (69.1 kg)   Goal Weight: Short Term 144 lb 6.4 oz (65.5 kg)   Goal Weight: Long Term 134 lb 6.4 oz (61 kg)   Expected  Outcomes Long Term: Adherence to nutrition and physical activity/exercise program aimed toward attainment of established weight goal;Short Term: Continue to assess and modify interventions until short term weight is achieved   Improve shortness of breath with ADL's Yes   Intervention Provide education, individualized exercise plan and daily activity instruction to help decrease symptoms of SOB with activities of daily living.   Expected Outcomes Short Term: Achieves a reduction of symptoms when performing activities of daily living.   Personal Goal Other Yes   Personal Goal Have more energy; increase strength and stamina; lose 10 lbs.    Intervention Patient will attend CR 3 days/week and supplement with exercise 2 days/week at home.    Expected Outcomes Patient will meet her personal goals.       Core Components/Risk Factors/Patient Goals Review:      Goals and Risk Factor Review    Row Name 12/23/16 1051             Core Components/Risk Factors/Patient Goals Review   Personal Goals Review Weight Management/Obesity;Improve shortness of breath with ADL's          Core Components/Risk Factors/Patient Goals at Discharge (Final Review):      Goals and Risk Factor Review - 12/23/16 1051      Core Components/Risk Factors/Patient Goals Review   Personal Goals Review Weight  Management/Obesity;Improve shortness of breath with ADL's      ITP Comments:   Comments: Patient arrived for 1st visit/orientation/education at 800. Patient was referred to CR by [name] due to S/P Mitral Valve Replacement with bioprosthetic valve (Z95.3) and S/P CABGx1 (Z95.1). During orientation advised patient on arrival and appointment times what to wear, what to do before, during and after exercise. Reviewed attendance and class policy. Talked about inclement weather and class consultation policy. Pt is scheduled to return Cardiac Rehab on 12/27/2016 at 9:30. Pt was advised to come to class 15 minutes before class starts. Patient was also given instructions on meeting with the dietician and attending the Family Structure classes. Pt is eager to get started. Patient participated in warm-up stretches followed by light weights and resistance bands. Patient was able to complete 6 minute walk test. Patient was measured for the equipment. Discussed equipment safety with patient. Took patient pre-anthropometric measurements. Patient finished visit at 1030.

## 2016-12-27 ENCOUNTER — Telehealth: Payer: Self-pay | Admitting: Family Medicine

## 2016-12-27 ENCOUNTER — Encounter (HOSPITAL_COMMUNITY)
Admission: RE | Admit: 2016-12-27 | Discharge: 2016-12-27 | Disposition: A | Discharge status: Home / Self Care | Payer: Medicare Other | Source: Ambulatory Visit | Attending: Cardiovascular Disease | Admitting: Cardiovascular Disease

## 2016-12-27 ENCOUNTER — Other Ambulatory Visit: Payer: Self-pay | Admitting: *Deleted

## 2016-12-27 DIAGNOSIS — Z8679 Personal history of other diseases of the circulatory system: Secondary | ICD-10-CM | POA: Insufficient documentation

## 2016-12-27 DIAGNOSIS — Z9889 Other specified postprocedural states: Secondary | ICD-10-CM | POA: Diagnosis not present

## 2016-12-27 DIAGNOSIS — Z951 Presence of aortocoronary bypass graft: Secondary | ICD-10-CM | POA: Diagnosis not present

## 2016-12-27 DIAGNOSIS — Z953 Presence of xenogenic heart valve: Secondary | ICD-10-CM | POA: Diagnosis not present

## 2016-12-27 MED ORDER — ROSUVASTATIN CALCIUM 5 MG PO TABS: 5.0000 mg | ORAL_TABLET | Freq: Every day | ORAL | 0 refills | Status: DC

## 2016-12-27 MED ORDER — FUROSEMIDE 40 MG PO TABS: 20.0000 mg | ORAL_TABLET | Freq: Every day | ORAL | 0 refills | Status: DC

## 2016-12-27 NOTE — Progress Notes (Signed)
Daily Session Note  Patient Details  Name: Catherine [name] MRN: [id number] Date of Birth: 11/17/1934 Referring Provider:     CARDIAC REHAB PHASE II ORIENTATION from 12/23/2016 in [place] CARDI[facility] RE[facility]IT[facility]  Referring Provider  Dr. Malcolm      Encounter Date: 12/27/2016  Check In:     Session Check In - 12/27/16 0930      Check-In   Location [facility]   Staff Present Diane Coad, MS, EP, CH[facility], Exercise Physiologist;Gregory Cowan, BS, EP, Exercise Physiologist;Debra Johnson, RN, BSN   Supervising physician immediately available to respond to emergencies See telemetry face sheet for immediately available MD   Medication changes reported     No   Fall or balance concerns reported    No   Warm-up and Cool-down Performed as group-led instruction   Resistance Training Performed Yes   VAD Patient? No     Pain Assessment   Currently in Pain? No/denies   Pain Score 0-No pain   Multiple Pain Sites No      Capillary Blood Glucose: No results found for this or any previous visit (from the past 24 hour(s)).    History  Smoking Status  . Former Smoker  . Packs/day: 1.00  . Years: 15.00  . Types: Cigarettes  . Quit date: 09/28/1975  Smokeless Tobacco  . Never Used    Goals Met:  Independence with exercise equipment Exercise tolerated well No report of cardiac concerns or symptoms Strength training completed today  Goals Unmet:  Not Applicable  Comments: Check out 1030.   Dr. Suresh Koneswaran is Medical Director for [facility]. 

## 2016-12-27 NOTE — Telephone Encounter (Signed)
Pt is returning rachel call for blood work results °

## 2016-12-29 ENCOUNTER — Encounter (HOSPITAL_COMMUNITY)
Admission: RE | Admit: 2016-12-29 | Discharge: 2016-12-29 | Disposition: A | Discharge status: Home / Self Care | Payer: Medicare Other | Source: Ambulatory Visit | Attending: Cardiovascular Disease | Admitting: Cardiovascular Disease

## 2016-12-29 ENCOUNTER — Ambulatory Visit (INDEPENDENT_AMBULATORY_CARE_PROVIDER_SITE_OTHER): Payer: Medicare Other | Admitting: Family Medicine

## 2016-12-29 ENCOUNTER — Encounter: Payer: Self-pay | Admitting: Family Medicine

## 2016-12-29 VITALS — BP 130/80 | HR 48 | Temp 98.6°F | Wt 146.0 lb

## 2016-12-29 DIAGNOSIS — Z953 Presence of xenogenic heart valve: Secondary | ICD-10-CM | POA: Diagnosis not present

## 2016-12-29 DIAGNOSIS — N898 Other specified noninflammatory disorders of vagina: Secondary | ICD-10-CM | POA: Diagnosis not present

## 2016-12-29 DIAGNOSIS — E876 Hypokalemia: Secondary | ICD-10-CM | POA: Diagnosis not present

## 2016-12-29 DIAGNOSIS — Z951 Presence of aortocoronary bypass graft: Secondary | ICD-10-CM

## 2016-12-29 DIAGNOSIS — Z8679 Personal history of other diseases of the circulatory system: Secondary | ICD-10-CM | POA: Diagnosis not present

## 2016-12-29 DIAGNOSIS — R300 Dysuria: Secondary | ICD-10-CM | POA: Diagnosis not present

## 2016-12-29 DIAGNOSIS — Z9889 Other specified postprocedural states: Secondary | ICD-10-CM | POA: Diagnosis not present

## 2016-12-29 LAB — POCT URINALYSIS DIPSTICK
BILIRUBIN UA: NEGATIVE
GLUCOSE UA: NEGATIVE
Ketones, UA: NEGATIVE
LEUKOCYTES UA: NEGATIVE
NITRITE UA: NEGATIVE
Protein, UA: NEGATIVE
RBC UA: NEGATIVE
Spec Grav, UA: 1.01 (ref 1.030–1.035)
Urobilinogen, UA: 0.2 (ref ?–2.0)
pH, UA: 6 (ref 5.0–8.0)

## 2016-12-29 MED ORDER — CLINDAMYCIN PHOSPHATE 2 % VA CREA: 1.0000 | TOPICAL_CREAM | Freq: Every day | VAGINAL | 0 refills | Status: DC

## 2016-12-29 NOTE — Progress Notes (Signed)
Pre visit review using our clinic review tool, if applicable. No additional management support is needed unless otherwise documented below in the visit note. 

## 2016-12-29 NOTE — Progress Notes (Signed)
Daily Session Note  Patient Details  Name: Catherine [name] MRN: [id number] Date of Birth: 1935/03/18 Referring Provider:     CARDIAC REHAB PHASE II ORIENTATION from 12/23/2016 in [place]  Referring Provider  Dr. Oval Linsey      Encounter Date: 12/29/2016  Check In:     Session Check In - 12/29/16 0930      Check-In   Location [facility]   Staff Present Diane Angelina Pih, MS, EP, [facility], Exercise Physiologist;Gregory Luther Parody, BS, EP, Exercise Physiologist;Sinclaire Artiga Wynetta Emery, RN, BSN   Supervising physician immediately available to respond to emergencies See telemetry face sheet for immediately available MD   Medication changes reported     No   Fall or balance concerns reported    No   Warm-up and Cool-down Performed as group-led instruction   Resistance Training Performed Yes   VAD Patient? No     Pain Assessment   Currently in Pain? No/denies   Pain Score 0-No pain   Multiple Pain Sites No      Capillary Blood Glucose: No results found for this or any previous visit (from the past 24 hour(s)).      Exercise Prescription Changes - 12/28/16 1300      Response to Exercise   Blood Pressure (Admit) 126/60   Blood Pressure (Exercise) 130/88   Blood Pressure (Exit) 110/60   Heart Rate (Admit) 50 bpm   Heart Rate (Exercise) 79 bpm   Heart Rate (Exit) 55 bpm   Rating of Perceived Exertion (Exercise) 9   Duration Progress to 30 minutes of  aerobic without signs/symptoms of physical distress   Intensity THRR unchanged     Progression   Progression Continue to progress workloads to maintain intensity without signs/symptoms of physical distress.     Resistance Training   Training Prescription Yes   Weight 1   Reps 10-15     Treadmill   MPH 2   Grade 0   Minutes 15   METs 2.5     NuStep   Level 2   SPM 14   Minutes 20   METs 3.44     Home Exercise Plan   Plans to continue exercise at Home (comment)   Frequency Add 2 additional days to  program exercise sessions.      History  Smoking Status  . Former Smoker  . Packs/day: 1.00  . Years: 15.00  . Types: Cigarettes  . Quit date: 09/28/1975  Smokeless Tobacco  . Never Used    Goals Met:  Independence with exercise equipment Exercise tolerated well No report of cardiac concerns or symptoms Strength training completed today  Goals Unmet:  Not Applicable  Comments: Check out 1030.   Dr. [name] Sable is Medical Director for [facility] Cardiac and Pulmonary Rehab.

## 2016-12-29 NOTE — Telephone Encounter (Signed)
Reviewed with patient at office visit 12/29/16

## 2016-12-29 NOTE — Patient Instructions (Signed)
Bacterial Vaginosis Bacterial vaginosis is a vaginal infection that occurs when the normal balance of bacteria in the vagina is disrupted. It results from an overgrowth of certain bacteria. This is the most common vaginal infection among women ages 32-44. Because bacterial vaginosis increases your risk for STIs (sexually transmitted infections), getting treated can help reduce your risk for chlamydia, gonorrhea, herpes, and HIV (human immunodeficiency virus). Treatment is also important for preventing complications in pregnant women, because this condition can cause an early (premature) delivery. What are the causes? This condition is caused by an increase in harmful bacteria that are normally present in small amounts in the vagina. However, the reason that the condition develops is not fully understood. What increases the risk? The following factors may make you more likely to develop this condition:  Having a new sexual partner or multiple sexual partners.  Having unprotected sex.  Douching.  Having an intrauterine device (IUD).  Smoking.  Drug and alcohol abuse.  Taking certain antibiotic medicines.  Being pregnant. You cannot get bacterial vaginosis from toilet seats, bedding, swimming pools, or contact with objects around you. What are the signs or symptoms? Symptoms of this condition include:  Grey or white vaginal discharge. The discharge can also be watery or foamy.  A fish-like odor with discharge, especially after sexual intercourse or during menstruation.  Itching in and around the vagina.  Burning or pain with urination. Some women with bacterial vaginosis have no signs or symptoms. How is this diagnosed? This condition is diagnosed based on:  Your medical history.  A physical exam of the vagina.  Testing a sample of vaginal fluid under a microscope to look for a large amount of bad bacteria or abnormal cells. Your health care provider may use a cotton swab or a  small wooden spatula to collect the sample. How is this treated? This condition is treated with antibiotics. These may be given as a pill, a vaginal cream, or a medicine that is put into the vagina (suppository). If the condition comes back after treatment, a second round of antibiotics may be needed. Follow these instructions at home: Medicines   Take over-the-counter and prescription medicines only as told by your health care provider.  Take or use your antibiotic as told by your health care provider. Do not stop taking or using the antibiotic even if you start to feel better. General instructions   If you have a female sexual partner, tell her that you have a vaginal infection. She should see her health care provider and be treated if she has symptoms. If you have a female sexual partner, he does not need treatment.  During treatment:  Avoid sexual activity until you finish treatment.  Do not douche.  Avoid alcohol as directed by your health care provider.  Avoid breastfeeding as directed by your health care provider.  Drink enough water and fluids to keep your urine clear or pale yellow.  Keep the area around your vagina and rectum clean.  Wash the area daily with warm water.  Wipe yourself from front to back after using the toilet.  Keep all follow-up visits as told by your health care provider. This is important. How is this prevented?  Do not douche.  Wash the outside of your vagina with warm water only.  Use protection when having sex. This includes latex condoms and dental dams.  Limit how many sexual partners you have. To help prevent bacterial vaginosis, it is best to have sex with just one  partner (monogamous).  Make sure you and your sexual partner are tested for STIs.  Wear cotton or cotton-lined underwear.  Avoid wearing tight pants and pantyhose, especially during summer.  Limit the amount of alcohol that you drink.  Do not use any products that contain  nicotine or tobacco, such as cigarettes and e-cigarettes. If you need help quitting, ask your health care provider.  Do not use illegal drugs. Where to find more information:  Centers for Disease Control and Prevention: AppraiserFraud.fi  American Sexual Health Association (ASHA): www.ashastd.org  U.S. Department of Health and Financial controller, Office on Women's Health: DustingSprays.pl or SecuritiesCard.it Contact a health care provider if:  Your symptoms do not improve, even after treatment.  You have more discharge or pain when urinating.  You have a fever.  You have pain in your abdomen.  You have pain during sex.  You have vaginal bleeding between periods. Summary  Bacterial vaginosis is a vaginal infection that occurs when the normal balance of bacteria in the vagina is disrupted.  Because bacterial vaginosis increases your risk for STIs (sexually transmitted infections), getting treated can help reduce your risk for chlamydia, gonorrhea, herpes, and HIV (human immunodeficiency virus). Treatment is also important for preventing complications in pregnant women, because the condition can cause an early (premature) delivery.  This condition is treated with antibiotic medicines. These may be given as a pill, a vaginal cream, or a medicine that is put into the vagina (suppository). This information is not intended to replace advice given to you by your health care provider. Make sure you discuss any questions you have with your health care provider. Document Released: 09/13/2005 Document Revised: 05/29/2016 Document Reviewed: 05/29/2016 Elsevier Interactive Patient Education  2017 [employer].  Consider Miralax for constipation,

## 2016-12-29 NOTE — Progress Notes (Signed)
Subjective:     Patient ID: Catherine [name], female   DOB: [birth year], 82 y.o.   MRN: [id number]  HPI Patient seen as a work in with some dysuria for the past few days. She has some mild burning with urination and also mild suprapubic discomfort. No fevers or chills. She has had some constipation issues and using a stool softener without much improvement. She's that she did have a fairly good bowel movement this morning. No bloody stool.  She has noticed a strong fishy odor from some vaginal discharge recently. She tried over-the-counter vinegar based douche just twice about a month ago without improvement. No vaginal bleeding.  Recent labs lipids much improved. Potassium 3.1. She tried reducing her Lasix back to one half tablet daily but had increased fluid has gone back to 1 daily. She's taking 1 potassium supplement daily.  Past Medical History:  Diagnosis Date  . Arthritis   . Asthma    last attack 02/2015  . Atrial fibrillation, chronic ([place])   . Cellulitis of left lower extremity 08/17/2016   Ulcer associated with severe venous insufficiency  . Chronic anticoagulation   . Chronic diastolic CHF (congestive heart failure) ([place])   . Chronic kidney disease    "RIGHT MANY KIDNEY INFECTIONS AND STONES"  . COPD (chronic obstructive pulmonary disease) ([place])   . Coronary artery disease   . Dizziness   . H/O: rheumatic fever   . Heart murmur   . Hypertension   . PONV (postoperative nausea and vomiting)    ' SOMETIMES', BUT NOT ALWAYS"  . S/P Maze operation for atrial fibrillation 10/14/2016   Complete bilateral atrial lesion set using cryothermy and bipolar radiofrequency ablation - atrial appendage was not treated due to previous surgical procedure (open mitral commissurotomy)  . S/P mitral valve replacement with bioprosthetic valve 10/14/2016   29 mm Medtronic Mosaic porcine bioprosthetic tissue valve  . UTI (urinary tract infection)   . Valvular heart disease    Has mitral stenosis  with prior mitral commissurotomy in 1970   Past Surgical History:  Procedure Laterality Date  . ABDOMINAL HYSTERECTOMY  1983   endometriosis  . APPENDECTOMY    . BACK SURGERY     neurosurgery x2  . CARDIAC CATHETERIZATION    . CARDIAC CATHETERIZATION N/A 08/03/2016   Procedure: Right/Left Heart Cath and Coronary Angiography;  Surgeon: Peter M Martinique, MD;  Location: [facility];  Service: Cardiovascular;  Laterality: N/A;  . cataract surg    . CHOLECYSTECTOMY N/A 05/30/2015   Procedure: LAPAROSCOPIC CHOLECYSTECTOMY WITH INTRAOPERATIVE CHOLANGIOGRAM;  Surgeon: Excell Seltzer, MD;  Location: [facility];  Service: General;  Laterality: N/A;  . COLONOSCOPY    . CORONARY ARTERY BYPASS GRAFT N/A 10/14/2016   Procedure: CORONARY ARTERY BYPASS GRAFTING (CABG);  Surgeon: Rexene Alberts, MD;  Location: [facility];  Service: Open Heart Surgery;  Laterality: N/A;  . EYE SURGERY    . MAZE N/A 10/14/2016   Procedure: MAZE;  Surgeon: Rexene Alberts, MD;  Location: [facility];  Service: Open Heart Surgery;  Laterality: N/A;  . MITRAL VALVE REPLACEMENT N/A 10/14/2016   Procedure: REDO MITRAL VALVE REPLACEMENT (MVR);  Surgeon: Rexene Alberts, MD;  Location: [facility];  Service: Open Heart Surgery;  Laterality: N/A;  . MITRAL VALVE SURGERY Left 1970   Open mitral commissurotomy via left thoracotomy approach  . TEE WITHOUT CARDIOVERSION N/A 07/05/2016   Procedure: TRANSESOPHAGEAL ECHOCARDIOGRAM (TEE);  Surgeon: [name], MD;  Location: [facility];  Service:  Cardiovascular;  Laterality: N/A;  . TEE WITHOUT CARDIOVERSION N/A 10/14/2016   Procedure: TRANSESOPHAGEAL ECHOCARDIOGRAM (TEE);  Surgeon: Rexene Alberts, MD;  Location: [facility];  Service: Open Heart Surgery;  Laterality: N/A;    reports that she quit smoking about 41 years ago. Her smoking use included Cigarettes. She has a 15.00 pack-year smoking history. She has never used smokeless tobacco. She reports that she does not drink alcohol or use drugs. family  history includes Leukemia in her father. Allergies  Allergen Reactions  . Aldactone [Spironolactone] Other (See Comments)    dyspnea  . Amoxicillin Palpitations    Tachycardia Has patient had a PCN reaction causing immediate rash, facial/tongue/throat swelling, SOB or lightheadedness with hypotension: no Has patient had a PCN reaction causing severe rash involving mucus membranes or skin necrosis: {no Has patient had a PCN reaction that required hospitalization {no Has patient had a PCN reaction occurring within the last 10 years: {yes If all of the above answers are "NO", then may proceed with Cephalosporin use.  . Diltiazem Other (See Comments)    Causing headaches   . Flagyl [Metronidazole Hcl] Other (See Comments)    Causing headaches    . Flovent [Fluticasone Propionate] Other (See Comments)    Leg cramps  . Gabapentin Swelling  . Lyrica [Pregabalin] Swelling  . Quinidine Diarrhea and Other (See Comments)    Fever diarrhea  . Simvastatin Other (See Comments)    Leg pain, myalgia  . Tramadol Nausea Only  . Verapamil Other (See Comments)    myalgias  . Ace Inhibitors Other (See Comments)    unknown  . Benazepril Hcl Cough  . Ciprocin-Fluocin-Procin [Fluocinolone Acetonide] Other (See Comments)    unknown  . Ciprofloxacin Diarrhea  . Codeine Nausea Only  . Nitrofurantoin Monohyd Macro Nausea Only     Review of Systems  Constitutional: Negative for fatigue.  Eyes: Negative for visual disturbance.  Respiratory: Negative for cough, chest tightness, shortness of breath and wheezing.   Cardiovascular: Negative for chest pain, palpitations and leg swelling.  Genitourinary: Positive for dysuria and vaginal discharge. Negative for vaginal bleeding.  Neurological: Negative for dizziness, seizures, syncope, weakness, light-headedness and headaches.       Objective:   Physical Exam  Constitutional: She appears well-developed and well-nourished.  Cardiovascular: Normal  rate.   Pulmonary/Chest: Effort normal and breath sounds normal. No respiratory distress. She has no wheezes. She has no rales.  Musculoskeletal:  Trace edema lower legs bilaterally       Assessment:     #1 dysuria. Urine dipstick is completely normal. No evidence for cystitis  #2 intermittent vaginal discharge. Question bacterial vaginosis. Allergy to metronidazole  #3 recent hypokalemia by labs    Plan:     -Increase potassium to 2 tablets daily -Recheck basic metabolic panel in 2 weeks -Consider MiraLAX as needed for constipation -Clindamycin 2% vaginal cream daily at bedtime for one week -Touch base by next week if vaginal symptoms not improved with the above  [name] MD [facility]

## 2016-12-29 NOTE — Progress Notes (Signed)
Cardiac Individual Treatment Plan  Patient Details  Name: Catherine [name] MRN: [id number] Date of Birth: 02/09/1935 Referring Provider:     CARDIAC REHAB PHASE II ORIENTATION from 12/23/2016 in [place]  Referring Provider  Dr. Oval Linsey      Initial Encounter Date:    CARDIAC REHAB PHASE II ORIENTATION from 12/23/2016 in [place]  Date  12/23/16  Referring Provider  Dr. Oval Linsey      Visit Diagnosis: Status post mitral valve replacement with bioprosthetic valve  S/P CABG x 1  Patient's Home Medications on Admission:  Current Outpatient Prescriptions:  .  acetaminophen (TYLENOL) 500 MG tablet, Take 500 mg by mouth every 6 (six) hours as needed for mild pain., Disp: , Rfl:  .  aspirin EC 81 MG EC tablet, Take 1 tablet (81 mg total) by mouth daily., Disp: , Rfl:  .  atenolol (TENORMIN) 25 MG tablet, Take 0.5 tablets (12.5 mg total) by mouth daily., Disp: 45 tablet, Rfl: 2 .  B Complex-C (B-COMPLEX WITH VITAMIN C) tablet, Take 1 tablet by mouth daily., Disp: , Rfl:  .  budesonide-formoterol (SYMBICORT) 80-4.5 MCG/ACT inhaler, Inhale 2 puffs into the lungs 2 (two) times daily. (Patient not taking: Reported on 12/20/2016), Disp: 1 Inhaler, Rfl: 11 .  diazepam (VALIUM) 5 MG tablet, Take one half tablet each morning and one at night prn anxiety (Patient taking differently: Take 2.5-5 mg by mouth 2 (two) times daily. Take one half tablet each morning if needed for anxiety and 1 tablet at bedtime every night.), Disp: 45 tablet, Rfl: 3 .  furosemide (LASIX) 40 MG tablet, Take 0.5 tablets (20 mg total) by mouth daily. Take one half tablet daily, Disp: 90 tablet, Rfl: 0 .  Multiple Vitamin (MULTIVITAMIN) tablet, Take 1 tablet by mouth daily.  , Disp: , Rfl:  .  OVER THE COUNTER MEDICATION, Apply 1 application topically at bedtime as needed (Leg Pain). Magni-Life topical cream for leg pain., Disp: , Rfl:  .  potassium chloride (K-DUR,KLOR-CON) 10 MEQ  tablet, Take 10 mEq by mouth daily., Disp: , Rfl:  .  rosuvastatin (CRESTOR) 5 MG tablet, Take 1 tablet (5 mg total) by mouth daily at 6 PM., Disp: 90 tablet, Rfl: 0 .  triamterene-hydrochlorothiazide (MAXZIDE-25) 37.5-25 MG tablet, Take 1 tablet by mouth daily., Disp: 90 tablet, Rfl: 2 .  warfarin (COUMADIN) 2 MG tablet, Take 1 tablet (2 mg total) by mouth daily at 6 PM. As directed by the coumadin clinic (Patient taking differently: Take 1-2 mg by mouth daily at 6 PM. As directed by the coumadin clinic,  Currently 1 tablet daily except 1/2 tablet on Monday - Wednesday - Friday.), Disp: 100 tablet, Rfl: 1  Past Medical History: Past Medical History:  Diagnosis Date  . Arthritis   . Asthma    last attack 02/2015  . Atrial fibrillation, chronic ([place])   . Cellulitis of left lower extremity 08/17/2016   Ulcer associated with severe venous insufficiency  . Chronic anticoagulation   . Chronic diastolic CHF (congestive heart failure) ([place])   . Chronic kidney disease    "RIGHT MANY KIDNEY INFECTIONS AND STONES"  . COPD (chronic obstructive pulmonary disease) ([place])   . Coronary artery disease   . Dizziness   . H/O: rheumatic fever   . Heart murmur   . Hypertension   . PONV (postoperative nausea and vomiting)    ' SOMETIMES', BUT NOT ALWAYS"  . S/P Maze operation for atrial fibrillation 10/14/2016  Complete bilateral atrial lesion set using cryothermy and bipolar radiofrequency ablation - atrial appendage was not treated due to previous surgical procedure (open mitral commissurotomy)  . S/P mitral valve replacement with bioprosthetic valve 10/14/2016   29 mm Medtronic Mosaic porcine bioprosthetic tissue valve  . UTI (urinary tract infection)   . Valvular heart disease    Has mitral stenosis with prior mitral commissurotomy in 1970    Tobacco Use: History  Smoking Status  . Former Smoker  . Packs/day: 1.00  . Years: 15.00  . Types: Cigarettes  . Quit date: 09/28/1975  Smokeless Tobacco   . Never Used    Labs: Recent Review Flowsheet Data    Labs for ITP Cardiac and Pulmonary Rehab Latest Ref Rng & Units 10/15/2016 10/15/2016 10/15/2016 10/15/2016 12/20/2016   Cholestrol 0 - 200 mg/dL - - - - 158   LDLCALC 0 - 99 mg/dL - - - - 61   LDLDIRECT mg/dL - - - - -   HDL >39.00 mg/dL - - - - 60.00   Trlycerides 0.0 - 149.0 mg/dL - - - - 184.0(H)   Hemoglobin A1c 4.8 - 5.6 % - - - - -   PHART 7.350 - 7.450 7.344(L) 7.347(L) 7.330(L) - -   PCO2ART 32.0 - 48.0 mmHg 48.2(H) 44.8 48.7(H) - -   HCO3 20.0 - 28.0 mmol/L 26.0 24.4 25.5 - -   TCO2 0 - 100 mmol/L 27 26 27 25  -   ACIDBASEDEF 0.0 - 2.0 mmol/L - 1.0 - - -   O2SAT % 99.0 99.0 99.0 - -      Capillary Blood Glucose: Lab Results  Component Value Date   GLUCAP 138 (H) 10/16/2016   GLUCAP 117 (H) 10/16/2016   GLUCAP 108 (H) 10/16/2016   GLUCAP 107 (H) 10/16/2016   GLUCAP 106 (H) 10/16/2016     Exercise Target Goals:    Exercise Program Goal: Individual exercise prescription set with THRR, safety & activity barriers. Participant demonstrates ability to understand and report RPE using BORG scale, to self-measure pulse accurately, and to acknowledge the importance of the exercise prescription.  Exercise Prescription Goal: Starting with aerobic activity 30 plus minutes a day, 3 days per week for initial exercise prescription. Provide home exercise prescription and guidelines that participant acknowledges understanding prior to discharge.  Activity Barriers & Risk Stratification:   6 Minute Walk:     6 Minute Walk    Row Name 12/23/16 1108         6 Minute Walk   Phase Initial     Distance 1200 feet     Distance % Change 0 %     Walk Time 6 minutes     # of Rest Breaks 0     MPH 2.27     METS 2.74     RPE 13     Perceived Dyspnea  13     VO2 Peak 7.01     Symptoms No     Resting HR 53 bpm     Resting BP 120/56     Max Ex. HR 71 bpm     Max Ex. BP 150/66     2 Minute Post BP 126/60        Oxygen  Initial Assessment:     Oxygen Initial Assessment - 12/23/16 1047      Home Oxygen   Home Oxygen Device None   Sleep Oxygen Prescription None   Home Exercise Oxygen Prescription None   Home at  Rest Exercise Oxygen Prescription None     Initial 6 min Walk   Oxygen Used None     Program Oxygen Prescription   Program Oxygen Prescription None      Oxygen Re-Evaluation:   Oxygen Discharge (Final Oxygen Re-Evaluation):   Initial Exercise Prescription:     Initial Exercise Prescription - 12/23/16 1100      Date of Initial Exercise RX and Referring Provider   Date 12/23/16   Referring Provider Dr. Oval Linsey     Treadmill   MPH 2   Grade 0   Minutes 15   METs 2.5     NuStep   Level 2   SPM 17   Minutes 20   METs 1.9     Prescription Details   Frequency (times per week) 3   Duration Progress to 30 minutes of continuous aerobic without signs/symptoms of physical distress     Intensity   THRR 40-80% of Max Heartrate [phone]   Ratings of Perceived Exertion 11-13   Perceived Dyspnea 0-4     Progression   Progression Continue progressive overload as per policy without signs/symptoms or physical distress.     Resistance Training   Training Prescription Yes   Weight 1   Reps 10-15      Perform Capillary Blood Glucose checks as needed.  Exercise Prescription Changes:      Exercise Prescription Changes    Row Name 12/28/16 1300             Response to Exercise   Blood Pressure (Admit) 126/60       Blood Pressure (Exercise) 130/88       Blood Pressure (Exit) 110/60       Heart Rate (Admit) 50 bpm       Heart Rate (Exercise) 79 bpm       Heart Rate (Exit) 55 bpm       Rating of Perceived Exertion (Exercise) 9       Duration Progress to 30 minutes of  aerobic without signs/symptoms of physical distress       Intensity THRR unchanged         Progression   Progression Continue to progress workloads to maintain intensity without signs/symptoms of  physical distress.         Resistance Training   Training Prescription Yes       Weight 1       Reps 10-15         Treadmill   MPH 2       Grade 0       Minutes 15       METs 2.5         NuStep   Level 2       SPM 14       Minutes 20       METs 3.44         Home Exercise Plan   Plans to continue exercise at Home (comment)       Frequency Add 2 additional days to program exercise sessions.          Exercise Comments:      Exercise Comments    Row Name 12/28/16 1306           Exercise Comments Patient has just started CR and will be progressed in time. Watts before level increase.          Exercise Goals and Review:      Exercise Goals  [place] Name 12/23/16 1047             Exercise Goals   Increase Physical Activity Yes       Intervention Provide advice, education, support and counseling about physical activity/exercise needs.;Develop an individualized exercise prescription for aerobic and resistive training based on initial evaluation findings, risk stratification, comorbidities and participant's personal goals.       Expected Outcomes Achievement of increased cardiorespiratory fitness and enhanced flexibility, muscular endurance and strength shown through measurements of functional capacity and personal statement of participant.       Increase Strength and Stamina Yes       Intervention Provide advice, education, support and counseling about physical activity/exercise needs.;Develop an individualized exercise prescription for aerobic and resistive training based on initial evaluation findings, risk stratification, comorbidities and participant's personal goals.       Expected Outcomes Achievement of increased cardiorespiratory fitness and enhanced flexibility, muscular endurance and strength shown through measurements of functional capacity and personal statement of participant.          Exercise Goals Re-Evaluation :    Discharge Exercise Prescription  (Final Exercise Prescription Changes):     Exercise Prescription Changes - 12/28/16 1300      Response to Exercise   Blood Pressure (Admit) 126/60   Blood Pressure (Exercise) 130/88   Blood Pressure (Exit) 110/60   Heart Rate (Admit) 50 bpm   Heart Rate (Exercise) 79 bpm   Heart Rate (Exit) 55 bpm   Rating of Perceived Exertion (Exercise) 9   Duration Progress to 30 minutes of  aerobic without signs/symptoms of physical distress   Intensity THRR unchanged     Progression   Progression Continue to progress workloads to maintain intensity without signs/symptoms of physical distress.     Resistance Training   Training Prescription Yes   Weight 1   Reps 10-15     Treadmill   MPH 2   Grade 0   Minutes 15   METs 2.5     NuStep   Level 2   SPM 14   Minutes 20   METs 3.44     Home Exercise Plan   Plans to continue exercise at Home (comment)   Frequency Add 2 additional days to program exercise sessions.      Nutrition:  Target Goals: Understanding of nutrition guidelines, daily intake of sodium 1500mg , cholesterol 200mg , calories 30% from fat and 7% or less from saturated fats, daily to have 5 or more servings of fruits and vegetables.  Biometrics:     Pre Biometrics - 12/23/16 1111      Pre Biometrics   Height 5\' 3"  (1.6 m)   Weight 152 lb 5.4 oz (69.1 kg)   Waist Circumference 35 inches   Hip Circumference 38 inches   Waist to Hip Ratio 0.92 %   BMI (Calculated) 27   Triceps Skinfold 18 mm   % Body Fat 37.3 %   Grip Strength 43 kg   Flexibility 0 in   Single Leg Stand 5 seconds       Nutrition Therapy Plan and Nutrition Goals:   Nutrition Discharge: Rate Your Plate Scores:     Nutrition Assessments - 12/23/16 1048      MEDFICTS Scores   Pre Score 50      Nutrition Goals Re-Evaluation:   Nutrition Goals Discharge (Final Nutrition Goals Re-Evaluation):   Psychosocial: Target Goals: Acknowledge presence or absence of significant  depression and/or stress, maximize coping skills, provide positive  support system. Participant is able to verbalize types and ability to use techniques and skills needed for reducing stress and depression.  Initial Review & Psychosocial Screening:     Initial Psych Review & Screening - 12/23/16 1119      Initial Review   Current issues with Current Depression;Current Sleep Concerns     Family Dynamics   Good Support System? Yes     Barriers   Psychosocial barriers to participate in program The patient should benefit from training in stress management and relaxation.;Psychosocial barriers identified (see note)  Patient's QOL score was 22.93 and her PHQ-9 score was 11 indicating some depression.      Screening Interventions   Interventions Encouraged to exercise  Patient says her depressed feeling started after her surgery and is related to her lack of energy to do anything. She feels she will improve when she feels more like doing things.       Quality of Life Scores:     Quality of Life - 12/23/16 1112      Quality of Life Scores   Health/Function Pre 20.3 %   Socioeconomic Pre 25.5 %   Psych/Spiritual Pre 24.86 %   Family Pre 24 %   GLOBAL Pre 22.93 %      PHQ-9: Recent Review Flowsheet Data    Depression screen [facility] 2/9 12/23/2016 12/20/2016 11/21/2015 12/30/2014 12/05/2013   Decreased Interest 0 0 0 0 0   Down, Depressed, Hopeless 2 1  0 0 0   PHQ - 2 Score 2 1 0 0 0   Altered sleeping 3 - - - -   Tired, decreased energy 3 - - - -   Change in appetite 0 - - - -   Feeling bad or failure about yourself  2 - - - -   Trouble concentrating 1 - - - -   Moving slowly or fidgety/restless 0 - - - -   Suicidal thoughts 0 - - - -   PHQ-9 Score 11 - - - -   Difficult doing work/chores Somewhat difficult - - - -     Interpretation of Total Score  Total Score Depression Severity:  1-4 = Minimal depression, 5-9 = Mild depression, 10-14 = Moderate depression, 15-19 = Moderately  severe depression, 20-27 = Severe depression   Psychosocial Evaluation and Intervention:     Psychosocial Evaluation - 12/23/16 1122      Psychosocial Evaluation & Interventions   Interventions Stress management education;Relaxation education;Encouraged to exercise with the program and follow exercise prescription   Comments Patient says her depressed feeling started after her surgery and is related to her lack of energy to do a lot.    Expected Outcomes Patient will complete the program meeting her goal of improved energy which will improve her depressed feeling.    Continue Psychosocial Services  Follow up required by staff      Psychosocial Re-Evaluation:   Psychosocial Discharge (Final Psychosocial Re-Evaluation):   Vocational Rehabilitation: Provide vocational rehab assistance to qualifying candidates.   Vocational Rehab Evaluation & Intervention:     Vocational Rehab - 12/23/16 1046      Initial Vocational Rehab Evaluation & Intervention   Assessment shows need for Vocational Rehabilitation No      Education: Education Goals: Education classes will be provided on a weekly basis, covering required topics. Participant will state understanding/return demonstration of topics presented.  Learning Barriers/Preferences:     Learning Barriers/Preferences - 12/23/16 1045  Learning Barriers/Preferences   Learning Barriers None   Learning Preferences Skilled Demonstration;Audio;Individual Instruction;Verbal Instruction      Education Topics: Hypertension, Hypertension Reduction -Define heart disease and high blood pressure. Discus how high blood pressure affects the body and ways to reduce high blood pressure.   Exercise and Your Heart -Discuss why it is important to exercise, the FITT principles of exercise, normal and abnormal responses to exercise, and how to exercise safely.   Angina -Discuss definition of angina, causes of angina, treatment of angina, and  how to decrease risk of having angina.   Cardiac Medications -Review what the following cardiac medications are used for, how they affect the body, and side effects that may occur when taking the medications.  Medications include Aspirin, Beta blockers, calcium channel blockers, ACE Inhibitors, angiotensin receptor blockers, diuretics, digoxin, and antihyperlipidemics.   Congestive Heart Failure -Discuss the definition of CHF, how to live with CHF, the signs and symptoms of CHF, and how keep track of weight and sodium intake.   Heart Disease and Intimacy -Discus the effect sexual activity has on the heart, how changes occur during intimacy as we age, and safety during sexual activity.   Smoking Cessation / COPD -Discuss different methods to quit smoking, the health benefits of quitting smoking, and the definition of COPD.   Nutrition I: Fats -Discuss the types of cholesterol, what cholesterol does to the heart, and how cholesterol levels can be controlled.   Nutrition II: Labels -Discuss the different components of food labels and how to read food label   Heart Parts and Heart Disease -Discuss the anatomy of the heart, the pathway of blood circulation through the heart, and these are affected by heart disease.   Stress I: Signs and Symptoms -Discuss the causes of stress, how stress may lead to anxiety and depression, and ways to limit stress.   Stress II: Relaxation -Discuss different types of relaxation techniques to limit stress.   Warning Signs of Stroke / TIA -Discuss definition of a stroke, what the signs and symptoms are of a stroke, and how to identify when someone is having stroke.   Knowledge Questionnaire Score:     Knowledge Questionnaire Score - 12/23/16 1046      Knowledge Questionnaire Score   Pre Score 21/28      Core Components/Risk Factors/Patient Goals at Admission:     Personal Goals and Risk Factors at Admission - 12/23/16 1048      Core  Components/Risk Factors/Patient Goals on Admission    Weight Management Yes   Intervention Weight Management: Develop a combined nutrition and exercise program designed to reach desired caloric intake, while maintaining appropriate intake of nutrient and fiber, sodium and fats, and appropriate energy expenditure required for the weight goal.;Weight Management: Provide education and appropriate resources to help participant work on and attain dietary goals.;Weight Management/Obesity: Establish reasonable short term and long term weight goals.;Obesity: Provide education and appropriate resources to help participant work on and attain dietary goals.   Admit Weight 152 lb 6.4 oz (69.1 kg)   Goal Weight: Short Term 144 lb 6.4 oz (65.5 kg)   Goal Weight: Long Term 134 lb 6.4 oz (61 kg)   Expected Outcomes Long Term: Adherence to nutrition and physical activity/exercise program aimed toward attainment of established weight goal;Short Term: Continue to assess and modify interventions until short term weight is achieved   Improve shortness of breath with ADL's Yes   Intervention Provide education, individualized exercise plan and daily activity instruction  to help decrease symptoms of SOB with activities of daily living.   Expected Outcomes Short Term: Achieves a reduction of symptoms when performing activities of daily living.   Personal Goal Other Yes   Personal Goal Have more energy; increase strength and stamina; lose 10 lbs.    Intervention Patient will attend CR 3 days/week and supplement with exercise 2 days/week at home.    Expected Outcomes Patient will meet her personal goals.       Core Components/Risk Factors/Patient Goals Review:      Goals and Risk Factor Review    Row Name 12/23/16 1051             Core Components/Risk Factors/Patient Goals Review   Personal Goals Review Weight Management/Obesity;Improve shortness of breath with ADL's          Core Components/Risk  Factors/Patient Goals at Discharge (Final Review):      Goals and Risk Factor Review - 12/23/16 1051      Core Components/Risk Factors/Patient Goals Review   Personal Goals Review Weight Management/Obesity;Improve shortness of breath with ADL's      ITP Comments:     ITP Comments    Row Name 12/29/16 1416           ITP Comments Patient new to the program completing 3 sessions. Will continue to monitor for progress.           Comments: ITP 30 Day REVIEW  Patient new to program completing 3 sessions. Will continue to monitor for progress.

## 2016-12-31 ENCOUNTER — Encounter (HOSPITAL_COMMUNITY)
Admission: RE | Admit: 2016-12-31 | Discharge: 2016-12-31 | Disposition: A | Discharge status: Home / Self Care | Payer: Medicare Other | Source: Ambulatory Visit | Attending: Cardiovascular Disease | Admitting: Cardiovascular Disease

## 2016-12-31 DIAGNOSIS — Z953 Presence of xenogenic heart valve: Secondary | ICD-10-CM | POA: Diagnosis not present

## 2016-12-31 DIAGNOSIS — Z8679 Personal history of other diseases of the circulatory system: Secondary | ICD-10-CM | POA: Diagnosis not present

## 2016-12-31 DIAGNOSIS — Z951 Presence of aortocoronary bypass graft: Secondary | ICD-10-CM | POA: Diagnosis not present

## 2016-12-31 DIAGNOSIS — Z9889 Other specified postprocedural states: Secondary | ICD-10-CM | POA: Diagnosis not present

## 2016-12-31 NOTE — Progress Notes (Signed)
Daily Session Note  Patient Details  Name: CHERRISE OCCHIPINTI MRN: [id number] Date of Birth: 11/29/1934 Referring Provider:     CARDIAC REHAB PHASE II ORIENTATION from 12/23/2016 in [place]  Referring Provider  Dr. Oval Linsey      Encounter Date: 12/31/2016  Check In:     Session Check In - 12/31/16 0947      Check-In   Location [facility]   Staff Present Aundra Dubin, RN, BSN;Kemonie Cutillo Luther Parody, BS, EP, Exercise Physiologist   Supervising physician immediately available to respond to emergencies See telemetry face sheet for immediately available MD   Medication changes reported     No   Fall or balance concerns reported    No   Warm-up and Cool-down Performed as group-led instruction   Resistance Training Performed Yes   VAD Patient? No     Pain Assessment   Currently in Pain? No/denies   Pain Score 0-No pain   Multiple Pain Sites No      Capillary Blood Glucose: No results found for this or any previous visit (from the past 24 hour(s)).    History  Smoking Status  . Former Smoker  . Packs/day: 1.00  . Years: 15.00  . Types: Cigarettes  . Quit date: 09/28/1975  Smokeless Tobacco  . Never Used    Goals Met:  Independence with exercise equipment Exercise tolerated well No report of cardiac concerns or symptoms Strength training completed today  Goals Unmet:  Not Applicable  Comments: Check out 1030   Dr. [name] Sable is Medical Director for [place] and Pulmonary Rehab.

## 2017-01-03 ENCOUNTER — Encounter (HOSPITAL_COMMUNITY)
Admission: RE | Admit: 2017-01-03 | Discharge: 2017-01-03 | Disposition: A | Discharge status: Home / Self Care | Payer: Medicare Other | Source: Ambulatory Visit | Attending: Cardiovascular Disease | Admitting: Cardiovascular Disease

## 2017-01-03 DIAGNOSIS — Z953 Presence of xenogenic heart valve: Secondary | ICD-10-CM

## 2017-01-03 DIAGNOSIS — Z8679 Personal history of other diseases of the circulatory system: Secondary | ICD-10-CM | POA: Diagnosis not present

## 2017-01-03 DIAGNOSIS — Z951 Presence of aortocoronary bypass graft: Secondary | ICD-10-CM | POA: Diagnosis not present

## 2017-01-03 DIAGNOSIS — Z9889 Other specified postprocedural states: Secondary | ICD-10-CM | POA: Diagnosis not present

## 2017-01-03 NOTE — Progress Notes (Signed)
Daily Session Note  Patient Details  Name: HONESTY MENTA MRN: [id number] Date of Birth: [birth year] Referring Provider:     CARDIAC REHAB PHASE II ORIENTATION from 12/23/2016 in [place]  Referring Provider  Dr. Oval Linsey      Encounter Date: 01/03/2017  Check In:     Session Check In - 01/03/17 0930      Check-In   Location [facility]   Staff Present Russella Dar, MS, EP, [facility], Exercise Physiologist;Gregory Luther Parody, BS, EP, Exercise Physiologist   Supervising physician immediately available to respond to emergencies See telemetry face sheet for immediately available MD   Medication changes reported     No   Fall or balance concerns reported    No   Warm-up and Cool-down Performed as group-led instruction   Resistance Training Performed Yes   VAD Patient? No     Pain Assessment   Currently in Pain? No/denies   Pain Score 0-No pain   Multiple Pain Sites No      Capillary Blood Glucose: No results found for this or any previous visit (from the past 24 hour(s)).    History  Smoking Status  . Former Smoker  . Packs/day: 1.00  . Years: 15.00  . Types: Cigarettes  . Quit date: 09/28/1975  Smokeless Tobacco  . Never Used    Goals Met:  Independence with exercise equipment Exercise tolerated well No report of cardiac concerns or symptoms Strength training completed today  Goals Unmet:  Not Applicable  Comments: Check out 1030.   Dr. [name] Sable is Medical Director for [facility] Card[facility] and Pulmonary Rehab.

## 2017-01-05 ENCOUNTER — Encounter (HOSPITAL_COMMUNITY)
Admission: RE | Admit: 2017-01-05 | Discharge: 2017-01-05 | Disposition: A | Discharge status: Home / Self Care | Payer: Medicare Other | Source: Ambulatory Visit | Attending: Cardiovascular Disease | Admitting: Cardiovascular Disease

## 2017-01-05 DIAGNOSIS — Z951 Presence of aortocoronary bypass graft: Secondary | ICD-10-CM | POA: Diagnosis not present

## 2017-01-05 DIAGNOSIS — Z953 Presence of xenogenic heart valve: Secondary | ICD-10-CM | POA: Diagnosis not present

## 2017-01-05 DIAGNOSIS — Z8679 Personal history of other diseases of the circulatory system: Secondary | ICD-10-CM | POA: Diagnosis not present

## 2017-01-05 DIAGNOSIS — Z9889 Other specified postprocedural states: Secondary | ICD-10-CM | POA: Diagnosis not present

## 2017-01-05 NOTE — Progress Notes (Signed)
Daily Session Note  Patient Details  Name: Catherine [name] MRN: [id number] Date of Birth: 1935-01-02 Referring Provider:     CARDIAC REHAB PHASE II ORIENTATION from 12/23/2016 in [place]  Referring Provider  Dr. Oval Linsey      Encounter Date: 01/05/2017  Check In:     Session Check In - 01/05/17 0930      Check-In   Location [facility]   Staff Present Diane Angelina Pih, MS, EP, [facility], Exercise Physiologist;Gregory Luther Parody, BS, EP, Exercise Physiologist;Hjalmer Iovino Wynetta Emery, RN, BSN   Supervising physician immediately available to respond to emergencies See telemetry face sheet for immediately available MD   Medication changes reported     No   Fall or balance concerns reported    No   Warm-up and Cool-down Performed as group-led instruction   Resistance Training Performed Yes   VAD Patient? No     Pain Assessment   Currently in Pain? No/denies   Pain Score 0-No pain   Multiple Pain Sites No      Capillary Blood Glucose: No results found for this or any previous visit (from the past 24 hour(s)).    History  Smoking Status  . Former Smoker  . Packs/day: 1.00  . Years: 15.00  . Types: Cigarettes  . Quit date: 09/28/1975  Smokeless Tobacco  . Never Used    Goals Met:  Independence with exercise equipment Exercise tolerated well No report of cardiac concerns or symptoms Strength training completed today  Goals Unmet:  Not Applicable  Comments: Check out 1030.   Dr. [name] Sable is Medical Director for [facility] Cardiac and Pulmonary Rehab.

## 2017-01-07 ENCOUNTER — Encounter (HOSPITAL_COMMUNITY)
Admission: RE | Admit: 2017-01-07 | Discharge: 2017-01-07 | Disposition: A | Discharge status: Home / Self Care | Payer: Medicare Other | Source: Ambulatory Visit | Attending: Cardiovascular Disease | Admitting: Cardiovascular Disease

## 2017-01-07 DIAGNOSIS — Z9889 Other specified postprocedural states: Secondary | ICD-10-CM | POA: Diagnosis not present

## 2017-01-07 DIAGNOSIS — Z8679 Personal history of other diseases of the circulatory system: Secondary | ICD-10-CM | POA: Diagnosis not present

## 2017-01-07 DIAGNOSIS — Z951 Presence of aortocoronary bypass graft: Secondary | ICD-10-CM | POA: Diagnosis not present

## 2017-01-07 DIAGNOSIS — Z953 Presence of xenogenic heart valve: Secondary | ICD-10-CM | POA: Diagnosis not present

## 2017-01-07 NOTE — Progress Notes (Signed)
Daily Session Note  Patient Details  Name: Catherine [name] MRN: [id number] Date of Birth: 19-Mar-1935 Referring Provider:     [place] from 12/23/2016 in [place]  Referring Provider  Dr. Oval Linsey      Encounter Date: 01/07/2017  Check In:     Session Check In - 01/07/17 0930      Check-In   Location [facility]   Staff Present Diane Angelina Pih, MS, EP, [facility], Exercise Physiologist;Nthony Lefferts Wynetta Emery, RN, BSN;Gregory Cowan, BS, EP, Exercise Physiologist   Supervising physician immediately available to respond to emergencies See telemetry face sheet for immediately available MD   Medication changes reported     No   Fall or balance concerns reported    No   Warm-up and Cool-down Performed as group-led instruction   Resistance Training Performed Yes   VAD Patient? No     Pain Assessment   Currently in Pain? No/denies   Pain Score 0-No pain   Multiple Pain Sites No      Capillary Blood Glucose: No results found for this or any previous visit (from the past 24 hour(s)).    History  Smoking Status  . Former Smoker  . Packs/day: 1.00  . Years: 15.00  . Types: Cigarettes  . Quit date: 09/28/1975  Smokeless Tobacco  . Never Used    Goals Met:  Independence with exercise equipment Exercise tolerated well No report of cardiac concerns or symptoms Strength training completed today  Goals Unmet:  Not Applicable  Comments: Check out 1030.   Dr. [name] Sable is Medical Director for [facility] Cardiac and Pulmonary Rehab.

## 2017-01-10 ENCOUNTER — Encounter (HOSPITAL_COMMUNITY)
Admission: RE | Admit: 2017-01-10 | Discharge: 2017-01-10 | Disposition: A | Discharge status: Home / Self Care | Payer: Medicare Other | Source: Ambulatory Visit | Attending: Cardiovascular Disease | Admitting: Cardiovascular Disease

## 2017-01-10 DIAGNOSIS — Z953 Presence of xenogenic heart valve: Secondary | ICD-10-CM

## 2017-01-10 DIAGNOSIS — Z8679 Personal history of other diseases of the circulatory system: Secondary | ICD-10-CM | POA: Diagnosis not present

## 2017-01-10 DIAGNOSIS — Z951 Presence of aortocoronary bypass graft: Secondary | ICD-10-CM | POA: Diagnosis not present

## 2017-01-10 DIAGNOSIS — Z9889 Other specified postprocedural states: Secondary | ICD-10-CM | POA: Diagnosis not present

## 2017-01-10 NOTE — Progress Notes (Signed)
Daily Session Note  Patient Details  Name: Catherine [name] MRN: [id number] Date of Birth: 07-14-1935 Referring Provider:     CARDIAC REHAB PHASE II ORIENTATION from 12/23/2016 in [place]  Referring Provider  Dr. Oval Linsey      Encounter Date: 01/10/2017  Check In:     Session Check In - 01/10/17 0930      Check-In   Location [facility]   Staff Present Aundra Dubin, RN, BSN;Gregory Luther Parody, BS, EP, Exercise Physiologist   Supervising physician immediately available to respond to emergencies See telemetry face sheet for immediately available MD   Medication changes reported     No   Fall or balance concerns reported    No   Warm-up and Cool-down Performed as group-led instruction   Resistance Training Performed Yes   VAD Patient? No     Pain Assessment   Currently in Pain? No/denies   Pain Score 0-No pain   Multiple Pain Sites No      Capillary Blood Glucose: No results found for this or any previous visit (from the past 24 hour(s)).    History  Smoking Status  . Former Smoker  . Packs/day: 1.00  . Years: 15.00  . Types: Cigarettes  . Quit date: 09/28/1975  Smokeless Tobacco  . Never Used    Goals Met:  Independence with exercise equipment Exercise tolerated well No report of cardiac concerns or symptoms Strength training completed today  Goals Unmet:  Not Applicable  Comments: Check out 1030.   Dr. [name] Sable is Medical Director for [facility] Cardiac and Pulmonary Rehab.

## 2017-01-12 ENCOUNTER — Encounter (HOSPITAL_COMMUNITY)
Admission: RE | Admit: 2017-01-12 | Discharge: 2017-01-12 | Disposition: A | Discharge status: Home / Self Care | Payer: Medicare Other | Source: Ambulatory Visit | Attending: Cardiovascular Disease | Admitting: Cardiovascular Disease

## 2017-01-12 DIAGNOSIS — Z9889 Other specified postprocedural states: Secondary | ICD-10-CM | POA: Diagnosis not present

## 2017-01-12 DIAGNOSIS — Z8679 Personal history of other diseases of the circulatory system: Secondary | ICD-10-CM | POA: Diagnosis not present

## 2017-01-12 DIAGNOSIS — Z953 Presence of xenogenic heart valve: Secondary | ICD-10-CM | POA: Diagnosis not present

## 2017-01-12 DIAGNOSIS — Z951 Presence of aortocoronary bypass graft: Secondary | ICD-10-CM

## 2017-01-12 NOTE — Progress Notes (Signed)
Daily Session Note  Patient Details  Name: NAMIKO PRITTS MRN: [id number] Date of Birth: 1934/12/27 Referring Provider:     CARDIAC REHAB PHASE II ORIENTATION from 12/23/2016 in [place]  Referring Provider  Dr. Oval Linsey      Encounter Date: 01/12/2017  Check In:     Session Check In - 01/12/17 0930      Check-In   Location [facility]   Staff Present Aundra Dubin, RN, BSN;Gregory Luther Parody, BS, EP, Exercise Physiologist   Supervising physician immediately available to respond to emergencies See telemetry face sheet for immediately available MD   Medication changes reported     No   Warm-up and Cool-down Performed as group-led instruction   Resistance Training Performed Yes   VAD Patient? No     Pain Assessment   Currently in Pain? No/denies   Pain Score 0-No pain   Multiple Pain Sites No      Capillary Blood Glucose: No results found for this or any previous visit (from the past 24 hour(s)).    History  Smoking Status  . Former Smoker  . Packs/day: 1.00  . Years: 15.00  . Types: Cigarettes  . Quit date: 09/28/1975  Smokeless Tobacco  . Never Used    Goals Met:  Independence with exercise equipment Exercise tolerated well No report of cardiac concerns or symptoms Strength training completed today  Goals Unmet:  Not Applicable  Comments: Check out 1030.   Dr. [name] Sable is Medical Director for [facility] Cardiac and Pulmonary Rehab.

## 2017-01-13 ENCOUNTER — Encounter (HOSPITAL_COMMUNITY): Payer: Medicare Other

## 2017-01-14 ENCOUNTER — Encounter (HOSPITAL_COMMUNITY)
Admission: RE | Admit: 2017-01-14 | Discharge: 2017-01-14 | Disposition: A | Discharge status: Home / Self Care | Payer: Medicare Other | Source: Ambulatory Visit | Attending: Cardiovascular Disease | Admitting: Cardiovascular Disease

## 2017-01-14 DIAGNOSIS — Z951 Presence of aortocoronary bypass graft: Secondary | ICD-10-CM | POA: Diagnosis not present

## 2017-01-14 DIAGNOSIS — Z8679 Personal history of other diseases of the circulatory system: Secondary | ICD-10-CM | POA: Diagnosis not present

## 2017-01-14 DIAGNOSIS — Z953 Presence of xenogenic heart valve: Secondary | ICD-10-CM | POA: Diagnosis not present

## 2017-01-14 DIAGNOSIS — Z9889 Other specified postprocedural states: Secondary | ICD-10-CM | POA: Diagnosis not present

## 2017-01-14 NOTE — Progress Notes (Signed)
Daily Session Note  Patient Details  Name: KJERSTIN ABRIGO MRN: [id number] Date of Birth: February 25, 1935 Referring Provider:     CARDIAC REHAB PHASE II ORIENTATION from 12/23/2016 in [place]  Referring Provider  Dr. Oval Linsey      Encounter Date: 01/14/2017  Check In:     Session Check In - 01/14/17 0930      Check-In   Location [facility]   Staff Present Suzanne Boron, BS, EP, Exercise Physiologist;Riann Oman Wynetta Emery, RN, BSN   Supervising physician immediately available to respond to emergencies See telemetry face sheet for immediately available MD   Medication changes reported     No   Fall or balance concerns reported    No   Warm-up and Cool-down Performed as group-led instruction   Resistance Training Performed Yes   VAD Patient? No     Pain Assessment   Currently in Pain? No/denies   Pain Score 0-No pain   Multiple Pain Sites No      Capillary Blood Glucose: No results found for this or any previous visit (from the past 24 hour(s)).    History  Smoking Status  . Former Smoker  . Packs/day: 1.00  . Years: 15.00  . Types: Cigarettes  . Quit date: 09/28/1975  Smokeless Tobacco  . Never Used    Goals Met:  Independence with exercise equipment Exercise tolerated well No report of cardiac concerns or symptoms Strength training completed today  Goals Unmet:  Not Applicable  Comments: Check out 1030.   Dr. [name] Sable is Medical Director for [facility] Cardiac and Pulmonary Rehab.

## 2017-01-17 ENCOUNTER — Ambulatory Visit (INDEPENDENT_AMBULATORY_CARE_PROVIDER_SITE_OTHER): Payer: Medicare Other | Admitting: Thoracic Surgery (Cardiothoracic Vascular Surgery)

## 2017-01-17 ENCOUNTER — Encounter (HOSPITAL_COMMUNITY): Admission: RE | Admit: 2017-01-17 | Payer: Medicare Other | Source: Ambulatory Visit

## 2017-01-17 ENCOUNTER — Ambulatory Visit (INDEPENDENT_AMBULATORY_CARE_PROVIDER_SITE_OTHER): Payer: Medicare Other | Admitting: General Practice

## 2017-01-17 ENCOUNTER — Other Ambulatory Visit (INDEPENDENT_AMBULATORY_CARE_PROVIDER_SITE_OTHER): Payer: Medicare Other

## 2017-01-17 ENCOUNTER — Encounter: Payer: Self-pay | Admitting: Thoracic Surgery (Cardiothoracic Vascular Surgery)

## 2017-01-17 VITALS — BP 128/60 | HR 59 | Resp 16 | Ht 64.0 in | Wt 146.0 lb

## 2017-01-17 DIAGNOSIS — Z9889 Other specified postprocedural states: Secondary | ICD-10-CM

## 2017-01-17 DIAGNOSIS — E876 Hypokalemia: Secondary | ICD-10-CM | POA: Diagnosis not present

## 2017-01-17 DIAGNOSIS — Z953 Presence of xenogenic heart valve: Secondary | ICD-10-CM | POA: Diagnosis not present

## 2017-01-17 DIAGNOSIS — Z8679 Personal history of other diseases of the circulatory system: Secondary | ICD-10-CM

## 2017-01-17 DIAGNOSIS — Z951 Presence of aortocoronary bypass graft: Secondary | ICD-10-CM | POA: Diagnosis not present

## 2017-01-17 DIAGNOSIS — I4891 Unspecified atrial fibrillation: Secondary | ICD-10-CM

## 2017-01-17 LAB — BASIC METABOLIC PANEL
BUN: 18 mg/dL (ref 6–23)
CHLORIDE: 102 meq/L (ref 96–112)
CO2: 34 mEq/L — ABNORMAL HIGH (ref 19–32)
Calcium: 9.7 mg/dL (ref 8.4–10.5)
Creatinine, Ser: 0.87 mg/dL (ref 0.40–1.20)
GFR: 66.67 mL/min (ref >60.00)
Glucose, Bld: 99 mg/dL (ref 70–99)
Potassium: 3.3 mEq/L — ABNORMAL LOW (ref 3.5–5.1)
Sodium: 145 mEq/L (ref 135–145)

## 2017-01-17 LAB — POCT INR: INR: 1.6

## 2017-01-17 NOTE — Patient Instructions (Addendum)
Take your Atenolol in the evening instead of the morning  Take 2 extra doses of potassium today (40 mEq)  Begin taking lasix and potassium twice daily for the next 3 days and monitor your weight and the swelling in your feet  Continue all other previous medications without any changes at this time  Adjust your Coumadin dose per the Coumadin clinic  Keep track of your weight on a daily basis  Endocarditis is a potentially serious infection of heart valves or inside lining of the heart.  It occurs more commonly in patients with diseased heart valves (such as patient's with aortic or mitral valve disease) and in patients who have undergone heart valve repair or replacement.  Certain surgical and dental procedures may put you at risk, such as dental cleaning, other dental procedures, or any surgery involving the respiratory, urinary, gastrointestinal tract, gallbladder or prostate gland.   To minimize your chances for develooping endocarditis, maintain good oral health and seek prompt medical attention for any infections involving the mouth, teeth, gums, skin or urinary tract.    Always notify your doctor or dentist about your underlying heart valve condition before having any invasive procedures. You will need to take antibiotics before certain procedures, including all routine dental cleanings or other dental procedures.  Your cardiologist or dentist should prescribe these antibiotics for you to be taken ahead of time.

## 2017-01-17 NOTE — Patient Instructions (Signed)
Pre visit review using our clinic review tool, if applicable. No additional management support is needed unless otherwise documented below in the visit note. 

## 2017-01-17 NOTE — Progress Notes (Signed)
[address]       [place] [address]             [phone]     CARDIOTHORACIC SURGERY OFFICE NOTE  Referring Provider is [name], MD PCP is [name], MD   HPI:  Patient is a 82 year old female with history of rheumatic fever during childhood and long-standing history of rheumatic heart disease status post open mitral commissurotomy in 1970, long-standing persistent atrial fibrillation, chronic diastolic congestive heart failure, coronary artery disease, hypertension, and COPD who returns to the office today for routine follow-up status post mitral valve replacement using a bioprosthetic tissue valve, coronary artery bypass grafting 1, and Maze procedure on 10/14/2016.   The patient was last seen here in our office on 11/22/2016 at which time she was maintaining sinus rhythm and doing fairly well.  She has been seen by her primary care physician since then and plans to return to see Dr. Oval Linsey for routine follow-up in 2 weeks. She recently started the outpatient cardiac rehabilitation program and reports that she is starting to make some progress. However, she complains that she still gets short of breath with activity and she was hoping that this would have improved by now here she has also noticed some increased swelling in her feet for the last couple of weeks. She no longer has any pain or shortness and her chest. Her breathing is comfortable at rest but she just has decreased energy and doesn't feel like doing much. She denies any orthopnea. She states that her weight may have gone up a few pounds over the last few weeks. She admits that she has not been very careful about salt intake.   Current Outpatient Prescriptions  Medication Sig Dispense Refill  . acetaminophen (TYLENOL) 500 MG tablet Take 500 mg by mouth every 6 (six) hours as needed for mild pain.    Marland Kitchen aspirin EC 81 MG EC tablet Take 1 tablet (81 mg total) by mouth daily.    Marland Kitchen  atenolol (TENORMIN) 25 MG tablet Take 0.5 tablets (12.5 mg total) by mouth daily. 45 tablet 2  . B Complex-C (B-COMPLEX WITH VITAMIN C) tablet Take 1 tablet by mouth daily.    . budesonide-formoterol (SYMBICORT) 80-4.5 MCG/ACT inhaler Inhale 2 puffs into the lungs 2 (two) times daily. 1 Inhaler 11  . diazepam (VALIUM) 5 MG tablet Take one half tablet each morning and one at night prn anxiety (Patient taking differently: Take 2.5-5 mg by mouth 2 (two) times daily. Take one half tablet each morning if needed for anxiety and 1 tablet at bedtime every night.) 45 tablet 3  . furosemide (LASIX) 40 MG tablet Take 0.5 tablets (20 mg total) by mouth daily. Take one half tablet daily 90 tablet 0  . Multiple Vitamin (MULTIVITAMIN) tablet Take 1 tablet by mouth daily.      Marland Kitchen OVER THE COUNTER MEDICATION Apply 1 application topically at bedtime as needed (Leg Pain). Magni-Life topical cream for leg pain.    . potassium chloride (K-DUR,KLOR-CON) 10 MEQ tablet Take 10 mEq by mouth daily.    . rosuvastatin (CRESTOR) 5 MG tablet Take 1 tablet (5 mg total) by mouth daily at 6 PM. 90 tablet 0  . triamterene-hydrochlorothiazide (MAXZIDE-25) 37.5-25 MG tablet Take 1 tablet by mouth daily. 90 tablet 2  . warfarin (COUMADIN) 2 MG tablet Take 1 tablet (2 mg total) by mouth daily at 6 PM. As directed by the coumadin clinic (Patient taking  differently: Take 1-2 mg by mouth daily at 6 PM. As directed by the coumadin clinic,  Currently 1 tablet daily except 1/2 tablet on Monday - Wednesday - Friday.) 100 tablet 1   No current facility-administered medications for this visit.       Physical Exam:   BP 128/60 (BP Location: Left Arm, Patient Position: Sitting, Cuff Size: Large)   Pulse (!) 59   Resp 16   Ht 5\' 4"  (1.626 m)   Wt 146 lb (66.2 kg)   SpO2 97% Comment: ON RA  BMI 25.06 kg/m   General:  Well appearing but somewhat frail  Chest:   Clear to auscultation with symmetrical breath sounds  CV:   Regular rate and  rhythm without murmur  Incisions:  Well-healed, sternum is stable  Abdomen:  Soft nontender  Extremities:  Warm and well-perfused, mild edema around both feet but not extending up above the ankles  Diagnostic Tests:  2 channel telemetry rhythm strip demonstrates what appears to be atrial fibrillation with controlled ventricular rate   Impression:  Patient appears clinically stable approximately 3 months status post mitral valve replacement using a bioprosthetic tissue valve, coronary artery bypass grafting, and Maze procedure. It appears that she may have gone back into atrial fibrillation with a controlled ventricular rate. She describes stable symptoms of exertional shortness of breath consistent with chronic diastolic congestive heart failure, New York Heart Association functional class II. He is somewhat disappointed that her energy level hasn't gotten much better, but she just recently started the outpatient cardiac rehabilitation program.  Plan:  I have suggested that the patient may try increasing her dose of Lasix to 40 mg twice daily for the next week or 2 and keep an eye on her weight. She can discuss whether or not this seems to help during her next office visit with Dr. Oval Linsey.  In addition, her potassium level was somewhat low at 3.3 when it was checked recently. I suggested that she increase potassium to 40 mEq daily for now.  Finally, I have suggested the patient may wish to try taking her atenolol at bedtime rather than in the morning to see if this results in any improved energy during the daytime. I have encouraged her to continue in the cardiac rehabilitation program to make every effort to increase her activity as much as possible.  In addition , the patient has been reminded regarding the importance of dental hygiene and the lifelong need for antibiotic prophylaxis for all dental cleanings and other related invasive procedures.  The patient will continue to follow-up closely  with Dr. Oval Linsey and Dr. Elease Hashimoto.  She will be seen in the atrial fibrillation clinic in approximately 3 months for routine surveillance. She will return to our office for routine follow-up next January, approximately 1 year following her surgery. All of her questions have been addressed.   I spent in excess of 15 minutes during the conduct of this office consultation and >50% of this time involved direct face-to-face encounter with the patient for counseling and/or coordination of their care.    Catherine [name]. Roxy Manns, MD 01/17/2017 1:50 PM

## 2017-01-19 ENCOUNTER — Encounter (HOSPITAL_COMMUNITY)
Admission: RE | Admit: 2017-01-19 | Discharge: 2017-01-19 | Disposition: A | Discharge status: Home / Self Care | Payer: Medicare Other | Source: Ambulatory Visit | Attending: Cardiovascular Disease | Admitting: Cardiovascular Disease

## 2017-01-19 DIAGNOSIS — Z951 Presence of aortocoronary bypass graft: Secondary | ICD-10-CM | POA: Diagnosis not present

## 2017-01-19 DIAGNOSIS — Z8679 Personal history of other diseases of the circulatory system: Secondary | ICD-10-CM | POA: Diagnosis not present

## 2017-01-19 DIAGNOSIS — Z9889 Other specified postprocedural states: Secondary | ICD-10-CM | POA: Diagnosis not present

## 2017-01-19 DIAGNOSIS — Z953 Presence of xenogenic heart valve: Secondary | ICD-10-CM

## 2017-01-19 NOTE — Progress Notes (Signed)
Cardiac Individual Treatment Plan  Patient Details  Name: Catherine [name] MRN: [id number] Date of Birth: [birth year] Referring Provider:     CARDIAC REHAB PHASE II ORIENTATION from 12/23/2016 in [place]  Referring Provider  Dr. Oval Linsey      Initial Encounter Date:    CARDIAC REHAB PHASE II ORIENTATION from 12/23/2016 in [place]  Date  12/23/16  Referring Provider  Dr. Oval Linsey      Visit Diagnosis: Status post mitral valve replacement with bioprosthetic valve  S/P CABG x 1  Patient's Home Medications on Admission:  Current Outpatient Prescriptions:  .  acetaminophen (TYLENOL) 500 MG tablet, Take 500 mg by mouth every 6 (six) hours as needed for mild pain., Disp: , Rfl:  .  aspirin EC 81 MG EC tablet, Take 1 tablet (81 mg total) by mouth daily., Disp: , Rfl:  .  atenolol (TENORMIN) 25 MG tablet, Take 0.5 tablets (12.5 mg total) by mouth daily., Disp: 45 tablet, Rfl: 2 .  B Complex-C (B-COMPLEX WITH VITAMIN C) tablet, Take 1 tablet by mouth daily., Disp: , Rfl:  .  budesonide-formoterol (SYMBICORT) 80-4.5 MCG/ACT inhaler, Inhale 2 puffs into the lungs 2 (two) times daily., Disp: 1 Inhaler, Rfl: 11 .  diazepam (VALIUM) 5 MG tablet, Take one half tablet each morning and one at night prn anxiety (Patient taking differently: Take 2.5-5 mg by mouth 2 (two) times daily. Take one half tablet each morning if needed for anxiety and 1 tablet at bedtime every night.), Disp: 45 tablet, Rfl: 3 .  furosemide (LASIX) 40 MG tablet, Take 0.5 tablets (20 mg total) by mouth daily. Take one half tablet daily, Disp: 90 tablet, Rfl: 0 .  Multiple Vitamin (MULTIVITAMIN) tablet, Take 1 tablet by mouth daily.  , Disp: , Rfl:  .  OVER THE COUNTER MEDICATION, Apply 1 application topically at bedtime as needed (Leg Pain). Magni-Life topical cream for leg pain., Disp: , Rfl:  .  potassium chloride (K-DUR,KLOR-CON) 10 MEQ tablet, Take 20 mEq by mouth daily., Disp: ,  Rfl:  .  rosuvastatin (CRESTOR) 5 MG tablet, Take 1 tablet (5 mg total) by mouth daily at 6 PM., Disp: 90 tablet, Rfl: 0 .  triamterene-hydrochlorothiazide (MAXZIDE-25) 37.5-25 MG tablet, Take 1 tablet by mouth daily., Disp: 90 tablet, Rfl: 2 .  warfarin (COUMADIN) 2 MG tablet, Take 1 tablet (2 mg total) by mouth daily at 6 PM. As directed by the coumadin clinic (Patient taking differently: Take 1-2 mg by mouth daily at 6 PM. As directed by the coumadin clinic,  Currently 1 tablet daily except 1/2 tablet on Monday - Wednesday - Friday.), Disp: 100 tablet, Rfl: 1  Past Medical History: Past Medical History:  Diagnosis Date  . Arthritis   . Asthma    last attack 02/2015  . Atrial fibrillation, chronic ([place])   . Cellulitis of left lower extremity 08/17/2016   Ulcer associated with severe venous insufficiency  . Chronic anticoagulation   . Chronic diastolic CHF (congestive heart failure) ([place])   . Chronic kidney disease    "RIGHT MANY KIDNEY INFECTIONS AND STONES"  . COPD (chronic obstructive pulmonary disease) ([place])   . Coronary artery disease   . Dizziness   . H/O: rheumatic fever   . Heart murmur   . Hypertension   . PONV (postoperative nausea and vomiting)    ' SOMETIMES', BUT NOT ALWAYS"  . S/P Maze operation for atrial fibrillation 10/14/2016   Complete bilateral atrial lesion set  using cryothermy and bipolar radiofrequency ablation - atrial appendage was not treated due to previous surgical procedure (open mitral commissurotomy)  . S/P mitral valve replacement with bioprosthetic valve 10/14/2016   29 mm Medtronic Mosaic porcine bioprosthetic tissue valve  . UTI (urinary tract infection)   . Valvular heart disease    Has mitral stenosis with prior mitral commissurotomy in 1970    Tobacco Use: History  Smoking Status  . Former Smoker  . Packs/day: 1.00  . Years: 15.00  . Types: Cigarettes  . Quit date: 09/28/1975  Smokeless Tobacco  . Never Used    Labs: Recent Review  Flowsheet Data    Labs for ITP Cardiac and Pulmonary Rehab Latest Ref Rng & Units 10/15/2016 10/15/2016 10/15/2016 10/15/2016 12/20/2016   Cholestrol 0 - 200 mg/dL - - - - 158   LDLCALC 0 - 99 mg/dL - - - - 61   LDLDIRECT mg/dL - - - - -   HDL >39.00 mg/dL - - - - 60.00   Trlycerides 0.0 - 149.0 mg/dL - - - - 184.0(H)   Hemoglobin A1c 4.8 - 5.6 % - - - - -   PHART 7.350 - 7.450 7.344(L) 7.347(L) 7.330(L) - -   PCO2ART 32.0 - 48.0 mmHg 48.2(H) 44.8 48.7(H) - -   HCO3 20.0 - 28.0 mmol/L 26.0 24.4 25.5 - -   TCO2 0 - 100 mmol/L _0 -   ACIDBASEDEF 0.0 - 2.0 mmol/L - 1.0 - - -   O2SAT % 99.0 99.0 99.0 - -      Capillary Blood Glucose: Lab Results  Component Value Date   GLUCAP 138 (H) 10/16/2016   GLUCAP 117 (H) 10/16/2016   GLUCAP 108 (H) 10/16/2016   GLUCAP 107 (H) 10/16/2016   GLUCAP 106 (H) 10/16/2016     Exercise Target Goals:    Exercise Program Goal: Individual exercise prescription set with THRR, safety & activity barriers. Participant demonstrates ability to understand and report RPE using BORG scale, to self-measure pulse accurately, and to acknowledge the importance of the exercise prescription.  Exercise Prescription Goal: Starting with aerobic activity 30 plus minutes a day, 3 days per week for initial exercise prescription. Provide home exercise prescription and guidelines that participant acknowledges understanding prior to discharge.  Activity Barriers & Risk Stratification:   6 Minute Walk:     6 Minute Walk    Row Name 12/23/16 1108         6 Minute Walk   Phase Initial     Distance 1200 feet     Distance % Change 0 %     Walk Time 6 minutes     # of Rest Breaks 0     MPH 2.27     METS 2.74     RPE 13     Perceived Dyspnea  13     VO2 Peak 7.01     Symptoms No     Resting HR 53 bpm     Resting BP 120/56     Max Ex. HR 71 bpm     Max Ex. BP 150/66     2 Minute Post BP 126/60        Oxygen Initial Assessment:     Oxygen Initial  Assessment - 12/23/16 1047      Home Oxygen   Home Oxygen Device None   Sleep Oxygen Prescription None   Home Exercise Oxygen Prescription None   Home at Rest Exercise Oxygen Prescription None  Initial 6 min Walk   Oxygen Used None     Program Oxygen Prescription   Program Oxygen Prescription None      Oxygen Re-Evaluation:   Oxygen Discharge (Final Oxygen Re-Evaluation):   Initial Exercise Prescription:     Initial Exercise Prescription - 12/23/16 1100      Date of Initial Exercise RX and Referring Provider   Date 12/23/16   Referring Provider Dr. Oval Linsey     Treadmill   MPH 2   Grade 0   Minutes 15   METs 2.5     NuStep   Level 2   SPM 17   Minutes 20   METs 1.9     Prescription Details   Frequency (times per week) 3   Duration Progress to 30 minutes of continuous aerobic without signs/symptoms of physical distress     Intensity   THRR 40-80% of Max Heartrate [phone]   Ratings of Perceived Exertion 11-13   Perceived Dyspnea 0-4     Progression   Progression Continue progressive overload as per policy without signs/symptoms or physical distress.     Resistance Training   Training Prescription Yes   Weight 1   Reps 10-15      Perform Capillary Blood Glucose checks as needed.  Exercise Prescription Changes:      Exercise Prescription Changes    Row Name 12/28/16 1300 01/17/17 1400           Response to Exercise   Blood Pressure (Admit) 126/60 130/68      Blood Pressure (Exercise) 130/88 146/58      Blood Pressure (Exit) 110/60 130/60      Heart Rate (Admit) 50 bpm 46 bpm      Heart Rate (Exercise) 79 bpm 57 bpm      Heart Rate (Exit) 55 bpm 55 bpm      Rating of Perceived Exertion (Exercise) 9 11      Duration Progress to 30 minutes of  aerobic without signs/symptoms of physical distress Progress to 30 minutes of  aerobic without signs/symptoms of physical distress      Intensity THRR unchanged THRR unchanged         Progression   Progression Continue to progress workloads to maintain intensity without signs/symptoms of physical distress. Continue to progress workloads to maintain intensity without signs/symptoms of physical distress.        Resistance Training   Training Prescription Yes Yes      Weight 1 2      Reps 10-15 10-15        Treadmill   MPH 2 2.2      Grade 0 0      Minutes 15 15      METs 2.5 2.7        NuStep   Level 2 3      SPM 14 18      Minutes 20 20      METs 3.44 3.45        Home Exercise Plan   Plans to continue exercise at Home (comment) Home (comment)      Frequency Add 2 additional days to program exercise sessions. Add 2 additional days to program exercise sessions.         Exercise Comments:      Exercise Comments    Row Name 12/28/16 1306 01/17/17 1413         Exercise Comments Patient has just started CR and will be progressed in time. Watts  before level increase. Patient is progressing very well in CR.          Exercise Goals and Review:      Exercise Goals    Row Name 12/23/16 1047             Exercise Goals   Increase Physical Activity Yes       Intervention Provide advice, education, support and counseling about physical activity/exercise needs.;Develop an individualized exercise prescription for aerobic and resistive training based on initial evaluation findings, risk stratification, comorbidities and participant's personal goals.       Expected Outcomes Achievement of increased cardiorespiratory fitness and enhanced flexibility, muscular endurance and strength shown through measurements of functional capacity and personal statement of participant.       Increase Strength and Stamina Yes       Intervention Provide advice, education, support and counseling about physical activity/exercise needs.;Develop an individualized exercise prescription for aerobic and resistive training based on initial evaluation findings, risk stratification,  comorbidities and participant's personal goals.       Expected Outcomes Achievement of increased cardiorespiratory fitness and enhanced flexibility, muscular endurance and strength shown through measurements of functional capacity and personal statement of participant.          Exercise Goals Re-Evaluation :    Discharge Exercise Prescription (Final Exercise Prescription Changes):     Exercise Prescription Changes - 01/17/17 1400      Response to Exercise   Blood Pressure (Admit) 130/68   Blood Pressure (Exercise) 146/58   Blood Pressure (Exit) 130/60   Heart Rate (Admit) 46 bpm   Heart Rate (Exercise) 57 bpm   Heart Rate (Exit) 55 bpm   Rating of Perceived Exertion (Exercise) 11   Duration Progress to 30 minutes of  aerobic without signs/symptoms of physical distress   Intensity THRR unchanged     Progression   Progression Continue to progress workloads to maintain intensity without signs/symptoms of physical distress.     Resistance Training   Training Prescription Yes   Weight 2   Reps 10-15     Treadmill   MPH 2.2   Grade 0   Minutes 15   METs 2.7     NuStep   Level 3   SPM 18   Minutes 20   METs 3.45     Home Exercise Plan   Plans to continue exercise at Home (comment)   Frequency Add 2 additional days to program exercise sessions.      Nutrition:  Target Goals: Understanding of nutrition guidelines, daily intake of sodium <1542m, cholesterol <2070m calories 30% from fat and 7% or less from saturated fats, daily to have 5 or more servings of fruits and vegetables.  Biometrics:     Pre Biometrics - 12/23/16 1111      Pre Biometrics   Height _0  (1.6 m)   Weight 152 lb 5.4 oz (69.1 kg)   Waist Circumference 35 inches   Hip Circumference 38 inches   Waist to Hip Ratio 0.92 %   BMI (Calculated) 27   Triceps Skinfold 18 mm   % Body Fat 37.3 %   Grip Strength 43 kg   Flexibility 0 in   Single Leg Stand 5 seconds       Nutrition Therapy  Plan and Nutrition Goals:   Nutrition Discharge: Rate Your Plate Scores:     Nutrition Assessments - 12/23/16 1048      MEDFICTS Scores   Pre Score 50  Nutrition Goals Re-Evaluation:   Nutrition Goals Discharge (Final Nutrition Goals Re-Evaluation):   Psychosocial: Target Goals: Acknowledge presence or absence of significant depression and/or stress, maximize coping skills, provide positive support system. Participant is able to verbalize types and ability to use techniques and skills needed for reducing stress and depression.  Initial Review & Psychosocial Screening:     Initial Psych Review & Screening - 12/23/16 1119      Initial Review   Current issues with Current Depression;Current Sleep Concerns     Family Dynamics   Good Support System? Yes     Barriers   Psychosocial barriers to participate in program The patient should benefit from training in stress management and relaxation.;Psychosocial barriers identified (see note)  Patient's QOL score was 22.93 and her PHQ-9 score was 11 indicating some depression.      Screening Interventions   Interventions Encouraged to exercise  Patient says her depressed feeling started after her surgery and is related to her lack of energy to do anything. She feels she will improve when she feels more like doing things.       Quality of Life Scores:     Quality of Life - 12/23/16 1112      Quality of Life Scores   Health/Function Pre 20.3 %   Socioeconomic Pre 25.5 %   Psych/Spiritual Pre 24.86 %   Family Pre 24 %   GLOBAL Pre 22.93 %      PHQ-9: Recent Review Flowsheet Data    Depression screen [facility] 2/9 12/23/2016 12/20/2016 11/21/2015 12/30/2014 12/05/2013   Decreased Interest 0 0 0 0 0   Down, Depressed, Hopeless 2 1  0 0 0   PHQ - 2 Score 2 1 0 0 0   Altered sleeping 3 - - - -   Tired, decreased energy 3 - - - -   Change in appetite 0 - - - -   Feeling bad or failure about yourself  2 - - - -   Trouble  concentrating 1 - - - -   Moving slowly or fidgety/restless 0 - - - -   Suicidal thoughts 0 - - - -   PHQ-9 Score 11 - - - -   Difficult doing work/chores Somewhat difficult - - - -     Interpretation of Total Score  Total Score Depression Severity:  1-4 = Minimal depression, 5-9 = Mild depression, 10-14 = Moderate depression, 15-19 = Moderately severe depression, 20-27 = Severe depression   Psychosocial Evaluation and Intervention:     Psychosocial Evaluation - 12/23/16 1122      Psychosocial Evaluation & Interventions   Interventions Stress management education;Relaxation education;Encouraged to exercise with the program and follow exercise prescription   Comments Patient says her depressed feeling started after her surgery and is related to her lack of energy to do a lot.    Expected Outcomes Patient will complete the program meeting her goal of improved energy which will improve her depressed feeling.    Continue Psychosocial Services  Follow up required by staff      Psychosocial Re-Evaluation:     Psychosocial Re-Evaluation    [place] Name 01/19/17 1356             Psychosocial Re-Evaluation   Current issues with None Identified       Expected Outcomes Patient will have no psychosocial issues identified at discharge.        Interventions Encouraged to attend Cardiac Rehabilitation for the exercise  Continue Psychosocial Services  No Follow up required          Psychosocial Discharge (Final Psychosocial Re-Evaluation):     Psychosocial Re-Evaluation - 01/19/17 1356      Psychosocial Re-Evaluation   Current issues with None Identified   Expected Outcomes Patient will have no psychosocial issues identified at discharge.    Interventions Encouraged to attend Cardiac Rehabilitation for the exercise   Continue Psychosocial Services  No Follow up required      Vocational Rehabilitation: Provide vocational rehab assistance to qualifying candidates.    Vocational Rehab Evaluation & Intervention:     Vocational Rehab - 12/23/16 1046      Initial Vocational Rehab Evaluation & Intervention   Assessment shows need for Vocational Rehabilitation No      Education: Education Goals: Education classes will be provided on a weekly basis, covering required topics. Participant will state understanding/return demonstration of topics presented.  Learning Barriers/Preferences:     Learning Barriers/Preferences - 12/23/16 1045      Learning Barriers/Preferences   Learning Barriers None   Learning Preferences Skilled Demonstration;Audio;Individual Instruction;Verbal Instruction      Education Topics: Hypertension, Hypertension Reduction -Define heart disease and high blood pressure. Discus how high blood pressure affects the body and ways to reduce high blood pressure.   Exercise and Your Heart -Discuss why it is important to exercise, the FITT principles of exercise, normal and abnormal responses to exercise, and how to exercise safely.   Angina -Discuss definition of angina, causes of angina, treatment of angina, and how to decrease risk of having angina.   Cardiac Medications -Review what the following cardiac medications are used for, how they affect the body, and side effects that may occur when taking the medications.  Medications include Aspirin, Beta blockers, calcium channel blockers, ACE Inhibitors, angiotensin receptor blockers, diuretics, digoxin, and antihyperlipidemics.   Congestive Heart Failure -Discuss the definition of CHF, how to live with CHF, the signs and symptoms of CHF, and how keep track of weight and sodium intake.   Heart Disease and Intimacy -Discus the effect sexual activity has on the heart, how changes occur during intimacy as we age, and safety during sexual activity.   Smoking Cessation / COPD -Discuss different methods to quit smoking, the health benefits of quitting smoking, and the definition of  COPD.   Nutrition I: Fats -Discuss the types of cholesterol, what cholesterol does to the heart, and how cholesterol levels can be controlled.   Nutrition II: Labels -Discuss the different components of food labels and how to read food label   Heart Parts and Heart Disease -Discuss the anatomy of the heart, the pathway of blood circulation through the heart, and these are affected by heart disease.   CARDIAC REHAB PHASE II EXERCISE from 01/12/2017 in [place]  Date  12/29/16  Educator  DJ  Instruction Review Code  2- meets goals/outcomes      Stress I: Signs and Symptoms -Discuss the causes of stress, how stress may lead to anxiety and depression, and ways to limit stress.   CARDIAC REHAB PHASE II EXERCISE from 01/12/2017 in [place]  Date  01/05/17  Educator  D. Coad  Instruction Review Code  2- meets goals/outcomes      Stress II: Relaxation -Discuss different types of relaxation techniques to limit stress.   CARDIAC REHAB PHASE II EXERCISE from 01/12/2017 in [place]  Date  01/12/17  Educator  DJ  Instruction  Review Code  2- meets goals/outcomes      Warning Signs of Stroke / TIA -Discuss definition of a stroke, what the signs and symptoms are of a stroke, and how to identify when someone is having stroke.   Knowledge Questionnaire Score:     Knowledge Questionnaire Score - 12/23/16 1046      Knowledge Questionnaire Score   Pre Score 21/28      Core Components/Risk Factors/Patient Goals at Admission:     Personal Goals and Risk Factors at Admission - 12/23/16 1048      Core Components/Risk Factors/Patient Goals on Admission    Weight Management Yes   Intervention Weight Management: Develop a combined nutrition and exercise program designed to reach desired caloric intake, while maintaining appropriate intake of nutrient and fiber, sodium and fats, and appropriate energy expenditure  required for the weight goal.;Weight Management: Provide education and appropriate resources to help participant work on and attain dietary goals.;Weight Management/Obesity: Establish reasonable short term and long term weight goals.;Obesity: Provide education and appropriate resources to help participant work on and attain dietary goals.   Admit Weight 152 lb 6.4 oz (69.1 kg)   Goal Weight: Short Term 144 lb 6.4 oz (65.5 kg)   Goal Weight: Long Term 134 lb 6.4 oz (61 kg)   Expected Outcomes Long Term: Adherence to nutrition and physical activity/exercise program aimed toward attainment of established weight goal;Short Term: Continue to assess and modify interventions until short term weight is achieved   Improve shortness of breath with ADL's Yes   Intervention Provide education, individualized exercise plan and daily activity instruction to help decrease symptoms of SOB with activities of daily living.   Expected Outcomes Short Term: Achieves a reduction of symptoms when performing activities of daily living.   Personal Goal Other Yes   Personal Goal Have more energy; increase strength and stamina; lose 10 lbs.    Intervention Patient will attend CR 3 days/week and supplement with exercise 2 days/week at home.    Expected Outcomes Patient will meet her personal goals.       Core Components/Risk Factors/Patient Goals Review:      Goals and Risk Factor Review    Row Name 12/23/16 1051 01/19/17 1354           Core Components/Risk Factors/Patient Goals Review   Personal Goals Review Weight Management/Obesity;Improve shortness of breath with ADL's Weight Management/Obesity;Improve shortness of breath with ADL's      Review  - Patient has completed 11 sessions gaining 0.6 lbs. She reports some improvement in her SOB. Will continue to monitor.      Expected Outcomes  - Patient will complete the program meeting her personal goals.          Core Components/Risk Factors/Patient Goals at  Discharge (Final Review):      Goals and Risk Factor Review - 01/19/17 1354      Core Components/Risk Factors/Patient Goals Review   Personal Goals Review Weight Management/Obesity;Improve shortness of breath with ADL's   Review Patient has completed 11 sessions gaining 0.6 lbs. She reports some improvement in her SOB. Will continue to monitor.   Expected Outcomes Patient will complete the program meeting her personal goals.       ITP Comments:     ITP Comments    Row Name 12/29/16 1416 12/30/16 1537         ITP Comments Patient new to the program completing 3 sessions. Will continue to monitor for progress.  Patient met  with Registered Dietitian to discuss nutrition topics including: Heart healthty eating, heart health cooking and make smart choices when shopping; Portion control; weight management; and hydration. Patient attended a group session with the hospital chaplian called [facility] to discuss and share how this recent diagnosis has effected their life         Comments: ITP 30 Day REVIEW Patient progressing well in the program. Will continue to monitor for progress.

## 2017-01-19 NOTE — Progress Notes (Signed)
Daily Session Note  Patient Details  Name: Myangel M Garrabrant MRN: [id number] Date of Birth: 02/04/1935 Referring Provider:     CARDIAC REHAB PHASE II ORIENTATION from 12/23/2016 in [place] [facility]  Referring Provider  Dr. Coffman [place]      Encounter Date: 01/19/2017  Check In:     Session Check In - 01/19/17 0930      Check-In   Location [facility]   Staff Present Diane Coad, MS, EP, CHC, Exercise Physiologist;Debra Johnson, RN, BSN;Gregory Cowan, BS, EP, Exercise Physiologist   Supervising physician immediately available to respond to emergencies See telemetry face sheet for immediately available MD   Medication changes reported     No   Fall or balance concerns reported    No   Warm-up and Cool-down Performed as group-led instruction   Resistance Training Performed Yes   VAD Patient? No     Pain Assessment   Currently in Pain? No/denies   Pain Score 0-No pain   Multiple Pain Sites No      Capillary Blood Glucose: No results found for this or any previous visit (from the past 24 hour(s)).    History  Smoking Status  . Former Smoker  . Packs/day: 1.00  . Years: 15.00  . Types: Cigarettes  . Quit date: 09/28/1975  Smokeless Tobacco  . Never Used    Goals Met:  Independence with exercise equipment Exercise tolerated well No report of cardiac concerns or symptoms Strength training completed today  Goals Unmet:  NA  Comments: Check out 1030.   Dr. Suresh Koneswaran is Medical Director for [facility]. 

## 2017-01-21 ENCOUNTER — Encounter (HOSPITAL_COMMUNITY)
Admission: RE | Admit: 2017-01-21 | Discharge: 2017-01-21 | Disposition: A | Discharge status: Home / Self Care | Payer: Medicare Other | Source: Ambulatory Visit | Attending: Cardiovascular Disease | Admitting: Cardiovascular Disease

## 2017-01-21 DIAGNOSIS — Z9889 Other specified postprocedural states: Secondary | ICD-10-CM | POA: Diagnosis not present

## 2017-01-21 DIAGNOSIS — Z8679 Personal history of other diseases of the circulatory system: Secondary | ICD-10-CM | POA: Diagnosis not present

## 2017-01-21 DIAGNOSIS — Z953 Presence of xenogenic heart valve: Secondary | ICD-10-CM

## 2017-01-21 DIAGNOSIS — Z951 Presence of aortocoronary bypass graft: Secondary | ICD-10-CM | POA: Diagnosis not present

## 2017-01-21 NOTE — Progress Notes (Signed)
Daily Session Note  Patient Details  Name: KAMILAH CORREIA MRN: [id number] Date of Birth: 1935/06/07 Referring Provider:     [place] from 12/23/2016 in [place]  Referring Provider  Dr. Oval Linsey      Encounter Date: 01/21/2017  Check In:     Session Check In - 01/21/17 0941      Check-In   Location [facility]   Staff Present Russella Dar, MS, EP, [facility], Exercise Physiologist;Deshaun Schou Luther Parody, BS, EP, Exercise Physiologist   Supervising physician immediately available to respond to emergencies See telemetry face sheet for immediately available MD   Medication changes reported     No   Fall or balance concerns reported    No   Warm-up and Cool-down Performed as group-led instruction   Resistance Training Performed Yes   VAD Patient? No     Pain Assessment   Currently in Pain? No/denies   Pain Score 0-No pain   Multiple Pain Sites No      Capillary Blood Glucose: No results found for this or any previous visit (from the past 24 hour(s)).    History  Smoking Status  . Former Smoker  . Packs/day: 1.00  . Years: 15.00  . Types: Cigarettes  . Quit date: 09/28/1975  Smokeless Tobacco  . Never Used    Goals Met:  Independence with exercise equipment Exercise tolerated well  Goals Unmet:  Not Applicable  Comments: Check out 1030   Dr. [name] Sable is Medical Director for [place] and Pulmonary Rehab.

## 2017-01-24 ENCOUNTER — Encounter (HOSPITAL_COMMUNITY)
Admission: RE | Admit: 2017-01-24 | Discharge: 2017-01-24 | Disposition: A | Discharge status: Home / Self Care | Payer: Medicare Other | Source: Ambulatory Visit | Attending: Cardiovascular Disease | Admitting: Cardiovascular Disease

## 2017-01-24 DIAGNOSIS — Z9889 Other specified postprocedural states: Secondary | ICD-10-CM | POA: Diagnosis not present

## 2017-01-24 DIAGNOSIS — Z951 Presence of aortocoronary bypass graft: Secondary | ICD-10-CM | POA: Diagnosis not present

## 2017-01-24 DIAGNOSIS — Z953 Presence of xenogenic heart valve: Secondary | ICD-10-CM | POA: Diagnosis not present

## 2017-01-24 DIAGNOSIS — Z8679 Personal history of other diseases of the circulatory system: Secondary | ICD-10-CM | POA: Diagnosis not present

## 2017-01-24 NOTE — Progress Notes (Signed)
Daily Session Note  Patient Details  Name: Catherine [name] MRN: [id number] Date of Birth: 02/04/1935 Referring Provider:     CARDIAC REHAB PHASE II ORIENTATION from 12/23/2016 in [place] [facility]  Referring Provider  Dr. Hobart      Encounter Date: 01/24/2017  Check In:     Session Check In - 01/24/17 0930      Check-In   Location [facility]   Staff Present Diane Coad, MS, EP, [facility], Exercise Physiologist;Gregory Cowan, BS, EP, Exercise Physiologist   Supervising physician immediately available to respond to emergencies See telemetry face sheet for immediately available MD   Medication changes reported     No   Fall or balance concerns reported    No   Warm-up and Cool-down Performed as group-led instruction   Resistance Training Performed Yes   VAD Patient? No     Pain Assessment   Currently in Pain? No/denies   Pain Score 0-No pain   Multiple Pain Sites No      Capillary Blood Glucose: No results found for this or any previous visit (from the past 24 hour(s)).    History  Smoking Status  . Former Smoker  . Packs/day: 1.00  . Years: 15.00  . Types: Cigarettes  . Quit date: 09/28/1975  Smokeless Tobacco  . Never Used    Goals Met:  Independence with exercise equipment Exercise tolerated well Strength training completed today  Goals Unmet:  Not Applicable  Comments: Check out: 10:30   Dr. Suresh Koneswaran is Medical Director for  Cardiac and Pulmonary Rehab. 

## 2017-01-26 ENCOUNTER — Encounter (HOSPITAL_COMMUNITY)
Admission: RE | Admit: 2017-01-26 | Discharge: 2017-01-26 | Disposition: A | Discharge status: Home / Self Care | Payer: Medicare Other | Source: Ambulatory Visit | Attending: Cardiovascular Disease | Admitting: Cardiovascular Disease

## 2017-01-26 DIAGNOSIS — Z8679 Personal history of other diseases of the circulatory system: Secondary | ICD-10-CM | POA: Insufficient documentation

## 2017-01-26 DIAGNOSIS — Z953 Presence of xenogenic heart valve: Secondary | ICD-10-CM | POA: Insufficient documentation

## 2017-01-26 DIAGNOSIS — Z9889 Other specified postprocedural states: Secondary | ICD-10-CM | POA: Insufficient documentation

## 2017-01-26 DIAGNOSIS — Z951 Presence of aortocoronary bypass graft: Secondary | ICD-10-CM

## 2017-01-26 NOTE — Progress Notes (Signed)
Daily Session Note  Patient Details  Name: Catherine [name] MRN: [id number] Date of Birth: Mar 11, 1935 Referring Provider:     CARDIAC REHAB PHASE II ORIENTATION from 12/23/2016 in [place]  Referring Provider  Dr. Oval Linsey      Encounter Date: 01/26/2017  Check In:     Session Check In - 01/26/17 0930      Check-In   Location [facility]   Staff Present Diane Angelina Pih, MS, EP, [facility], Exercise Physiologist;Emmalynne Courtney Wynetta Emery, RN, BSN   Supervising physician immediately available to respond to emergencies See telemetry face sheet for immediately available MD   Medication changes reported     No   Fall or balance concerns reported    No   Warm-up and Cool-down Performed as group-led instruction   Resistance Training Performed Yes   VAD Patient? No     Pain Assessment   Currently in Pain? No/denies   Pain Score 0-No pain   Multiple Pain Sites No      Capillary Blood Glucose: No results found for this or any previous visit (from the past 24 hour(s)).    History  Smoking Status  . Former Smoker  . Packs/day: 1.00  . Years: 15.00  . Types: Cigarettes  . Quit date: 09/28/1975  Smokeless Tobacco  . Never Used    Goals Met:  Independence with exercise equipment Exercise tolerated well No report of cardiac concerns or symptoms Strength training completed today  Goals Unmet:  Not Applicable  Comments: Check out 1030.   Dr. [name] Sable is Medical Director for [facility] Cardiac and Pulmonary Rehab.

## 2017-01-28 ENCOUNTER — Encounter (HOSPITAL_COMMUNITY)
Admission: RE | Admit: 2017-01-28 | Discharge: 2017-01-28 | Disposition: A | Discharge status: Home / Self Care | Payer: Medicare Other | Source: Ambulatory Visit | Attending: Cardiovascular Disease | Admitting: Cardiovascular Disease

## 2017-01-28 DIAGNOSIS — Z953 Presence of xenogenic heart valve: Secondary | ICD-10-CM | POA: Diagnosis not present

## 2017-01-28 DIAGNOSIS — Z951 Presence of aortocoronary bypass graft: Secondary | ICD-10-CM | POA: Diagnosis not present

## 2017-01-28 DIAGNOSIS — Z8679 Personal history of other diseases of the circulatory system: Secondary | ICD-10-CM | POA: Diagnosis not present

## 2017-01-28 DIAGNOSIS — Z9889 Other specified postprocedural states: Secondary | ICD-10-CM | POA: Diagnosis not present

## 2017-01-28 NOTE — Progress Notes (Signed)
Daily Session Note  Patient Details  Name: Catherine [name] MRN: [id number] Date of Birth: May 14, 1935 Referring Provider:     CARDIAC REHAB PHASE II ORIENTATION from 12/23/2016 in [place]  Referring Provider  Dr. Oval Linsey      Encounter Date: 01/28/2017  Check In:     Session Check In - 01/28/17 0930      Check-In   Location [facility]   Staff Present Lidiya Reise Angelina Pih, MS, EP, [facility], Exercise Physiologist;Debra Wynetta Emery, RN, BSN   Supervising physician immediately available to respond to emergencies See telemetry face sheet for immediately available MD   Medication changes reported     No   Fall or balance concerns reported    No   Warm-up and Cool-down Performed as group-led instruction   Resistance Training Performed Yes   VAD Patient? No     Pain Assessment   Currently in Pain? No/denies   Pain Score 0-No pain   Multiple Pain Sites No      Capillary Blood Glucose: No results found for this or any previous visit (from the past 24 hour(s)).    History  Smoking Status  . Former Smoker  . Packs/day: 1.00  . Years: 15.00  . Types: Cigarettes  . Quit date: 09/28/1975  Smokeless Tobacco  . Never Used    Goals Met:  Independence with exercise equipment Exercise tolerated well No report of cardiac concerns or symptoms Strength training completed today  Goals Unmet:  Not Applicable  Comments: Check out: 10:30   Dr. [name] Sable is Medical Director for [place] and Pulmonary Rehab.

## 2017-01-29 ENCOUNTER — Other Ambulatory Visit: Payer: Self-pay | Admitting: Family Medicine

## 2017-01-31 ENCOUNTER — Encounter (HOSPITAL_COMMUNITY): Payer: Medicare Other

## 2017-02-01 ENCOUNTER — Encounter: Payer: Self-pay | Admitting: Cardiovascular Disease

## 2017-02-01 ENCOUNTER — Ambulatory Visit (INDEPENDENT_AMBULATORY_CARE_PROVIDER_SITE_OTHER): Payer: Medicare Other | Admitting: Cardiovascular Disease

## 2017-02-01 ENCOUNTER — Other Ambulatory Visit (INDEPENDENT_AMBULATORY_CARE_PROVIDER_SITE_OTHER): Payer: Medicare Other

## 2017-02-01 ENCOUNTER — Ambulatory Visit (INDEPENDENT_AMBULATORY_CARE_PROVIDER_SITE_OTHER): Payer: Medicare Other | Admitting: General Practice

## 2017-02-01 VITALS — BP 140/74 | HR 52 | Ht 63.0 in | Wt 152.0 lb

## 2017-02-01 DIAGNOSIS — I10 Essential (primary) hypertension: Secondary | ICD-10-CM

## 2017-02-01 DIAGNOSIS — I480 Paroxysmal atrial fibrillation: Secondary | ICD-10-CM

## 2017-02-01 DIAGNOSIS — Z953 Presence of xenogenic heart valve: Secondary | ICD-10-CM

## 2017-02-01 DIAGNOSIS — E7800 Pure hypercholesterolemia, unspecified: Secondary | ICD-10-CM

## 2017-02-01 DIAGNOSIS — I4891 Unspecified atrial fibrillation: Secondary | ICD-10-CM

## 2017-02-01 DIAGNOSIS — R0602 Shortness of breath: Secondary | ICD-10-CM

## 2017-02-01 LAB — CBC WITH DIFFERENTIAL/PLATELET
BASOS PCT: 0.9 % (ref 0.0–3.0)
Basophils Absolute: 0.1 10*3/uL (ref 0.0–0.1)
EOS ABS: 0.2 10*3/uL (ref 0.0–0.7)
EOS PCT: 2 % (ref 0.0–5.0)
HCT: 38.2 % (ref 36.0–46.0)
Hemoglobin: 12.9 g/dL (ref 12.0–15.0)
LYMPHS ABS: 1.9 10*3/uL (ref 0.7–4.0)
Lymphocytes Relative: 25.7 % (ref 12.0–46.0)
MCHC: 33.8 g/dL (ref 30.0–36.0)
MCV: 86.7 fl (ref 78.0–100.0)
MONO ABS: 0.5 10*3/uL (ref 0.1–1.0)
Monocytes Relative: 7.2 % (ref 3.0–12.0)
NEUTROS PCT: 64.2 % (ref 43.0–77.0)
Neutro Abs: 4.9 10*3/uL (ref 1.4–7.7)
Platelets: 230 10*3/uL (ref 150.0–400.0)
RBC: 4.4 Mil/uL (ref 3.87–5.11)
RDW: 15.2 % (ref 11.5–15.5)
WBC: 7.6 10*3/uL (ref 4.0–10.5)

## 2017-02-01 LAB — BASIC METABOLIC PANEL
BUN: 15 mg/dL (ref 6–23)
CO2: 32 mEq/L (ref 19–32)
Calcium: 9.8 mg/dL (ref 8.4–10.5)
Chloride: 102 mEq/L (ref 96–112)
Creatinine, Ser: 0.9 mg/dL (ref 0.40–1.20)
GFR: 64.1 mL/min (ref >60.00)
Glucose, Bld: 94 mg/dL (ref 70–99)
Potassium: 3.5 mEq/L (ref 3.5–5.1)
SODIUM: 143 meq/L (ref 135–145)

## 2017-02-01 LAB — POCT INR: INR: 2.6

## 2017-02-01 LAB — TSH: TSH: 4.09 u[IU]/mL (ref 0.35–4.50)

## 2017-02-01 MED ORDER — NEBIVOLOL HCL 10 MG PO TABS: 10.0000 mg | ORAL_TABLET | Freq: Every day | ORAL | 5 refills | Status: DC

## 2017-02-01 NOTE — Patient Instructions (Addendum)
Medication Instructions:  STOP ATENOLOL   START BYSTOLIC 10 MG DAILY   Labwork: BMET/CBC/TSH AT YOUR PRIMARY CARE   Testing/Procedures: Your physician has requested that you have an echocardiogram. Echocardiography is a painless test that uses sound waves to create images of your heart. It provides your doctor with information about the size and shape of your heart and how well your heart's chambers and valves are working. This procedure takes approximately one hour. There are no restrictions for this procedure. [facility] [place] [address]  Follow-Up: Your physician recommends that you schedule a follow-up appointment in: 1 MONTH  Any Other Special Instructions Will Be Listed Below (If Applicable). IF YOUR WEIGHT GOES UP 2 POUNDS OVER NIGHT OR 5 POUNDS IN 1 WEEK OR YOU HAVE INCREASED SWELLING TAKE AN EXTRA LASIX (FUROSEMIDE) 40 MG  IF YOU TAKE AN EXTRA FUROSEMIDE TAKE AN EXTRA 58 MEQ    If you need a refill on your cardiac medications before your next appointment, please call your pharmacy.

## 2017-02-01 NOTE — Patient Instructions (Signed)
Pre visit review using our clinic review tool, if applicable. No additional management support is needed unless otherwise documented below in the visit note. 

## 2017-02-01 NOTE — Progress Notes (Signed)
I have reviewed and agree with the plan. 

## 2017-02-01 NOTE — Addendum Note (Signed)
Addended by: Alvina Filbert B on: 02/01/2017 01:18 PM   Modules accepted: Orders

## 2017-02-01 NOTE — Progress Notes (Signed)
Cardiology Office Note   Date:  02/01/2017   ID:  Catherine [name], DOB Aug 04, 1935, MRN [id number]  PCP:  Catherine Post, MD  Cardiologist:   Catherine Latch, MD  Cardiothoracic surgeon: Catherine [name]  Chief Complaint  Patient presents with  . Follow-up    no chest pain, has shortness of breath frequently, has edema in feet and legs, occassional cramping in legs at night, occassional diizziness     History of Present Illness: ANNALEI [name] is a 82 y.o. 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 Catherine [name] was previously a patient of Catherine [name]. Catherine [name] underwent mitral commissurotomy in the 1970s. On her echocardiogram 02/2014 Catherine [name] had an ejection fraction of 60-65% with moderate to severe mitral regurgitation. There was also mild aortic regurgitation. Her follow-up echo 02/2015 showed an EF of 60-65% with moderate to severe mitral regurgitation, moderate tricuspid regurgitation and elevated pulmonary artery pressures.  Repeat echo 9/11/7 revealed moderate pulmonary stenosis with moderate to severe mitral regurgitation.  Catherine [name] did not have any pulmonary hypertension and her LV was not dilated.  Catherine [name] was referred for left and right heart catheterization. Catherine [name] was found to have severe mitral stenosis with a 13 mmHg mean gradient, MVA 0.99 cm^2. Catherine [name] also had 50% LCx and 80% RCA lesions.  Catherine [name] had a 29 mm bioprosthetic mitral valve, single vessel CABG and MAZE with Catherine [name] on 10/14/16.    Catherine [name] has been participating in cardiac rehab.  Catherine [name] reports that Catherine [name] hasn't been feeling well.  Catherine [name] gets very tired and wants to sleep all the time.  After cardiac rehab Catherine [name] has to nap for 2-4 hours.  Catherine [name] saw Catherine [name] 01/17/17 at which time Catherine [name] continued to note shortness of breath with activity and some increased LE edema.  Her lasix was incerased to bid for one week, which helped her  edema.  Catherine [name] denies orthopnea or PND. Catherine [name] also switched atenolol to bedtime but Catherine [name] continues to feel tired.  Catherine [name] had an episode of dizziness that occurred upon standing quickly in the middle of the night. Catherine [name] sometimes feels like her heart is racing. Catherine [name] started back taking Symbicort, which has helped with her breathing.  For the last two days Catherine [name] has been struggling with vertigo.  Catherine [name] had cramping in her R leg that was resposive to mustard.  Catherine [name] got up in the middle of the night and had a severe episode of vertigo.     Past Medical History:  Diagnosis Date  . Arthritis   . Asthma    last attack 02/2015  . Atrial fibrillation, chronic ([place])   . Cellulitis of left lower extremity 08/17/2016   Ulcer associated with severe venous insufficiency  . Chronic anticoagulation   . Chronic diastolic CHF (congestive heart failure) ([place])   . Chronic kidney disease    "RIGHT MANY KIDNEY INFECTIONS AND STONES"  . COPD (chronic obstructive pulmonary disease) ([place])   . Coronary artery disease   . Dizziness   . H/O: rheumatic fever   . Heart murmur   . Hypertension   . PONV (postoperative nausea and vomiting)    ' SOMETIMES', BUT NOT ALWAYS"  . S/P Maze operation for atrial fibrillation 10/14/2016   Complete bilateral atrial lesion set using cryothermy and bipolar radiofrequency ablation - atrial appendage was not treated due to previous surgical procedure (open mitral commissurotomy)  . S/P  mitral valve replacement with bioprosthetic valve 10/14/2016   29 mm Medtronic Mosaic porcine bioprosthetic tissue valve  . UTI (urinary tract infection)   . Valvular heart disease    Has mitral stenosis with prior mitral commissurotomy in 1970    Past Surgical History:  Procedure Laterality Date  . ABDOMINAL HYSTERECTOMY  1983   endometriosis  . APPENDECTOMY    . BACK SURGERY     neurosurgery x2  . CARDIAC CATHETERIZATION    . CARDIAC CATHETERIZATION N/A 08/03/2016   Procedure: Right/Left Heart Cath and  Coronary Angiography;  Surgeon: Catherine M Martinique, MD;  Location: [place] CV [facility];  Service: Cardiovascular;  Laterality: N/A;  . cataract surg    . CHOLECYSTECTOMY N/A 05/30/2015   Procedure: LAPAROSCOPIC CHOLECYSTECTOMY WITH INTRAOPERATIVE CHOLANGIOGRAM;  Surgeon: Catherine Seltzer, MD;  Location: [place];  Service: General;  Laterality: N/A;  . COLONOSCOPY    . CORONARY ARTERY BYPASS GRAFT N/A 10/14/2016   Procedure: CORONARY ARTERY BYPASS GRAFTING (CABG);  Surgeon: Catherine Alberts, MD;  Location: [place];  Service: Open Heart Surgery;  Laterality: N/A;  . EYE SURGERY    . MAZE N/A 10/14/2016   Procedure: MAZE;  Surgeon: Catherine Alberts, MD;  Location: [place];  Service: Open Heart Surgery;  Laterality: N/A;  . MITRAL VALVE REPLACEMENT N/A 10/14/2016   Procedure: REDO MITRAL VALVE REPLACEMENT (MVR);  Surgeon: Catherine Alberts, MD;  Location: [place];  Service: Open Heart Surgery;  Laterality: N/A;  . MITRAL VALVE SURGERY Left 1970   Open mitral commissurotomy via left thoracotomy approach  . TEE WITHOUT CARDIOVERSION N/A 07/05/2016   Procedure: TRANSESOPHAGEAL ECHOCARDIOGRAM (TEE);  Surgeon: Catherine Latch, MD;  Location: [place];  Service: Cardiovascular;  Laterality: N/A;  . TEE WITHOUT CARDIOVERSION N/A 10/14/2016   Procedure: TRANSESOPHAGEAL ECHOCARDIOGRAM (TEE);  Surgeon: Catherine Alberts, MD;  Location: [place];  Service: Open Heart Surgery;  Laterality: N/A;    Current Outpatient Prescriptions  Medication Sig Dispense Refill  . acetaminophen (TYLENOL) 500 MG tablet Take 500 mg by mouth every 6 (six) hours as needed for mild pain.    Marland Kitchen aspirin EC 81 MG EC tablet Take 1 tablet (81 mg total) by mouth daily.    . B Complex-C (B-COMPLEX WITH VITAMIN C) tablet Take 1 tablet by mouth daily.    . budesonide-formoterol (SYMBICORT) 80-4.5 MCG/ACT inhaler Inhale 2 puffs into the lungs 2 (two) times daily. 1 Inhaler 11  . diazepam (VALIUM) 5 MG tablet Take one half tablet each morning and one at night  prn anxiety (Patient taking differently: Take 2.5-5 mg by mouth 2 (two) times daily. Take one half tablet each morning if needed for anxiety and 1 tablet at bedtime every night.) 45 tablet 3  . furosemide (LASIX) 40 MG tablet Take 40 mg by mouth.    . Multiple Vitamin (MULTIVITAMIN) tablet Take 1 tablet by mouth daily.      Marland Kitchen OVER THE COUNTER MEDICATION Apply 1 application topically at bedtime as needed (Leg Pain). Magni-Life topical cream for leg pain.    . potassium chloride (K-DUR,KLOR-CON) 10 MEQ tablet Take 10 mEq by mouth 3 (three) times daily.     . rosuvastatin (CRESTOR) 5 MG tablet Take 1 tablet (5 mg total) by mouth daily at 6 PM. 90 tablet 0  . triamterene-hydrochlorothiazide (MAXZIDE-25) 37.5-25 MG tablet Take 1 tablet by mouth daily. 90 tablet 2  . warfarin (COUMADIN) 2 MG tablet Take 1 tablet (2 mg total) by mouth daily at  6 PM. As directed by the coumadin clinic (Patient taking differently: Take 1-2 mg by mouth daily at 6 PM. As directed by the coumadin clinic,  Currently 1 tablet daily except 1/2 tablet on Monday - Wednesday - Friday.) 100 tablet 1  . nebivolol (BYSTOLIC) 10 MG tablet Take 1 tablet (10 mg total) by mouth daily. 30 tablet 5   No current facility-administered medications for this visit.     Allergies:   Aldactone [spironolactone]; Amoxicillin; Diltiazem; Flagyl [metronidazole hcl]; Flovent [fluticasone propionate]; Gabapentin; Lyrica [pregabalin]; Quinidine; Simvastatin; Tramadol; Verapamil; Ace inhibitors; Benazepril hcl; Ciprocin-fluocin-procin [fluocinolone acetonide]; Ciprofloxacin; Codeine; and Nitrofurantoin monohyd macro    Social History:  The patient  reports that Catherine [name] quit smoking about 41 years ago. Her smoking use included Cigarettes. Catherine [name] has a 15.00 pack-year smoking history. Catherine [name] has never used smokeless tobacco. Catherine [name] reports that Catherine [name] does not drink alcohol or use drugs.   Family History:  The patient's family history includes Leukemia in her father.     ROS:  Please see the history of present illness.   Otherwise, review of systems are positive for none.   All other systems are reviewed and negative.    PHYSICAL EXAM: VS:  BP 140/74   Pulse (!) 52   Ht 5\' 3"  (1.6 m)   Wt 68.9 kg (152 lb)   BMI 26.93 kg/m  , BMI Body mass index is 26.93 kg/m. GENERAL:  Well appearing.  No acute distress HEENT:  Pupils equal round and reactive, fundi not visualized, oral mucosa unremarkable NECK:  No jugular venous distention, waveform within normal limits, carotid upstroke brisk and symmetric, no bruits LYMPHATICS:  No cervical adenopathy LUNGS:  Clear to auscultation bilaterally.  No crackles, rhonchi or wheezes HEART:  Irregularly irregular.  PMI not displaced or sustained,S1 and S2 within normal limits, no S3, no S4, no clicks, no rubs, II/VI systolic murmur throughout Chest: Healing midline incision.  Serous drainage from midline incision.  No erythema ABD:  Flat, positive bowel sounds normal in frequency in pitch, no bruits, no rebound, no guarding, no midline pulsatile mass, no hepatomegaly, no splenomegaly EXT:  2 plus pulses throughout, 2+ pitting edema to above the ankles bilaterally, no cyanosis no clubbing SKIN:  No rashes no nodules NEURO:  Cranial nerves II through XII grossly intact, motor grossly intact throughout PSYCH:  Cognitively intact, oriented to person place and time   EKG:  EKG is ordered today. The ekg ordered 05/21/16 demonstrates atrial fibrillateion.  Prior septal infarct.  Rate 60 bpm.  Unchanged from 10/22/15. 10/27/16: Sinus rhythm.  Rate 60 bpm.  First degree heart block. 02/01/17: Sinus bradycardia with sinus arrhythmia and first degree block.     Echo 06/07/16:  Study Conclusions  - Left ventricle: The cavity size was normal. Wall thickness was   normal. Systolic function was vigorous. The estimated ejection   fraction was in the range of 65% to 70%. - Mitral valve: MV is thickened. with some restricted motion  Peak   and mean gradients through the valve are 22 and 8 mm Hg   respectively MVA by Pt1/2 is 1.9 cm2. Calcified annulus. Mildly   thickened leaflets . There was moderate to severe regurgitation. - Left atrium: The atrium was massively dilated. - Right atrium: The atrium was mildly dilated.   LHC/RHC 08/03/16: The left ventricular systolic function is normal.  LV end diastolic pressure is normal.  The left ventricular ejection fraction is 55-65% by visual estimate.  There is no aortic  valve stenosis.  There is severe mitral valve stenosis.  Prox Cx to Mid Cx lesion, 50 %stenosed.  Mid RCA-2 lesion, 80 %stenosed.  Mid RCA-1 lesion, 80 %stenosed.  Hemodynamic findings consistent with mild pulmonary hypertension.  LV end diastolic pressure is normal.   1. Coronary artery disease   - 50% mid LCx   - 80% sequential lesions in the mid RCA 2. Normal LV function 3. Severe mitral stenosis. MV gradient of 13 mm Hg. MVA 0.99 cm squared with index 0.57. 4. Moderate to severe mitral insufficiency 5. Mild pulmonary HTN. 6. Normal LV EDP 7. No significant AV gradient 8. Occluded right brachial artery.  Carotid Dopplers 10/12/16: 1-39% bilateral ICA stenosis  Recent Labs: 10/17/2016: Magnesium 2.0 11/22/2016: Hemoglobin 12.8; Platelets 259 12/20/2016: ALT 17 01/17/2017: BUN 18; Creatinine, Ser 0.87; Potassium 3.3; Sodium 145    Lipid Panel    Component Value Date/Time   CHOL 158 12/20/2016 1112   TRIG 184.0 (H) 12/20/2016 1112   HDL 60.00 12/20/2016 1112   CHOLHDL 3 12/20/2016 1112   VLDL 36.8 12/20/2016 1112   LDLCALC 61 12/20/2016 1112   LDLDIRECT 134.7 08/28/2014 1009      Wt Readings from Last 3 Encounters:  02/01/17 68.9 kg (152 lb)  01/17/17 66.2 kg (146 lb)  12/29/16 66.2 kg (146 lb)      ASSESSMENT AND PLAN:  # Rheumatic mitral valve disease: # s/p bioprosthetic mitral valve: No evidence of heart failure on exam.  Catherine [name] will take an extra 40 mg of Lasix as  needed for increased lower extremity edema or weight gain we discussed the importance of continuing to limit her salt intake. We will obtain an echocardiogram to establish a baseline.  # Persistent Atrial fibrillation: M was initially thought to be in atrial fibrillation today. However, upon review of her EKG it appears that Catherine [name] has sinus bradycardia with sinus arrhythmia and a persistent first-degree AV block. We will stop atenolol and switched to nebivolol 10 mg daily, as Catherine [name] seems to be struggling with fatigue. This could be related to both bradycardia and a beta blocker.  Continue warfarin.  Check BMP, CBC and TSH.  # Hypertension:  Blood pressure is above goal.  Switch atenolol to nebivolol and continue Maxzide.  # Hyperlipidemia: Continue rosuvastatin and ezetimibe.    Current medicines are reviewed at length with the patient today.  The patient does not have concerns regarding medicines.  The following changes have been made:  no change  Labs/ tests ordered today include:   Orders Placed This Encounter  Procedures  . CBC with Differential/Platelet  . TSH  . Basic metabolic panel  . ECHOCARDIOGRAM COMPLETE    Disposition:   FU with Justan Gaede C. Oval Linsey, MD, [facility] in 1 month.    This note was written with the assistance of speech recognition software.  Please excuse any transcriptional errors.  Signed, Sammy Cassar C. Oval Linsey, MD, [facility]  02/01/2017 10:16 AM    [place]

## 2017-02-02 ENCOUNTER — Encounter (HOSPITAL_COMMUNITY)
Admission: RE | Admit: 2017-02-02 | Discharge: 2017-02-02 | Disposition: A | Discharge status: Home / Self Care | Payer: Medicare Other | Source: Ambulatory Visit | Attending: Cardiovascular Disease | Admitting: Cardiovascular Disease

## 2017-02-02 DIAGNOSIS — Z951 Presence of aortocoronary bypass graft: Secondary | ICD-10-CM | POA: Diagnosis not present

## 2017-02-02 DIAGNOSIS — Z953 Presence of xenogenic heart valve: Secondary | ICD-10-CM

## 2017-02-02 DIAGNOSIS — Z8679 Personal history of other diseases of the circulatory system: Secondary | ICD-10-CM | POA: Diagnosis not present

## 2017-02-02 DIAGNOSIS — Z9889 Other specified postprocedural states: Secondary | ICD-10-CM | POA: Diagnosis not present

## 2017-02-02 NOTE — Progress Notes (Signed)
Daily Session Note  Patient Details  Name: Catherine [name] MRN: [id number] Date of Birth: Sep 13, 1935 Referring Provider:     CARDIAC REHAB PHASE II ORIENTATION from 12/23/2016 in [place]  Referring Provider  Dr. Oval Linsey      Encounter Date: 02/02/2017  Check In:     Session Check In - 02/02/17 0930      Check-In   Location [facility]   Staff Present Diane Angelina Pih, MS, EP, [facility], Exercise Physiologist;Gregory Luther Parody, BS, EP, Exercise Physiologist;Kadiatou Oplinger Wynetta Emery, RN, BSN   Supervising physician immediately available to respond to emergencies See telemetry face sheet for immediately available MD   Medication changes reported     Yes   Comments Cardiologist d/c'd Atenolol and added Bystolic 10 mg daily Tuesday 02/01/17.   Fall or balance concerns reported    No   Warm-up and Cool-down Performed as group-led instruction   Resistance Training Performed Yes   VAD Patient? No     Pain Assessment   Currently in Pain? No/denies   Pain Score 0-No pain   Multiple Pain Sites No      Capillary Blood Glucose: Results for orders placed or performed in visit on 02/01/17 (from the past 24 hour(s))  CBC with Differential/Platelet     Status: None   Collection Time: 02/01/17 12:55 PM  Result Value Ref Range   WBC 7.6 4.0 - 10.5 K/uL   RBC 4.40 3.87 - 5.11 Mil/uL   Hemoglobin 12.9 12.0 - 15.0 g/dL   HCT 38.2 36.0 - 46.0 %   MCV 86.7 78.0 - 100.0 fl   MCHC 33.8 30.0 - 36.0 g/dL   RDW 15.2 11.5 - 15.5 %   Platelets 230.0 150.0 - 400.0 K/uL   Neutrophils Relative % 64.2 43.0 - 77.0 %   Lymphocytes Relative 25.7 12.0 - 46.0 %   Monocytes Relative 7.2 3.0 - 12.0 %   Eosinophils Relative 2.0 0.0 - 5.0 %   Basophils Relative 0.9 0.0 - 3.0 %   Neutro Abs 4.9 1.4 - 7.7 K/uL   Lymphs Abs 1.9 0.7 - 4.0 K/uL   Monocytes Absolute 0.5 0.1 - 1.0 K/uL   Eosinophils Absolute 0.2 0.0 - 0.7 K/uL   Basophils Absolute 0.1 0.0 - 0.1 K/uL  Basic metabolic panel      Status: None   Collection Time: 02/01/17 12:55 PM  Result Value Ref Range   Sodium 143 135 - 145 mEq/L   Potassium 3.5 3.5 - 5.1 mEq/L   Chloride 102 96 - 112 mEq/L   CO2 32 19 - 32 mEq/L   Glucose, Bld 94 70 - 99 mg/dL   BUN 15 6 - 23 mg/dL   Creatinine, Ser 0.90 0.40 - 1.20 mg/dL   Calcium 9.8 8.4 - 10.5 mg/dL   GFR 64.10 >60.00 mL/min  TSH     Status: None   Collection Time: 02/01/17 12:55 PM  Result Value Ref Range   TSH 4.09 0.35 - 4.50 uIU/mL      History  Smoking Status  . Former Smoker  . Packs/day: 1.00  . Years: 15.00  . Types: Cigarettes  . Quit date: 09/28/1975  Smokeless Tobacco  . Never Used    Goals Met:  Independence with exercise equipment Exercise tolerated well No report of cardiac concerns or symptoms Strength training completed today  Goals Unmet:  Not Applicable  Comments: Check out 1030.   Dr. [name] Sable is Medical Director for [facility] Cardiac and Pulmonary  Rehab.

## 2017-02-04 ENCOUNTER — Encounter (HOSPITAL_COMMUNITY)
Admission: RE | Admit: 2017-02-04 | Discharge: 2017-02-04 | Disposition: A | Discharge status: Home / Self Care | Payer: Medicare Other | Source: Ambulatory Visit | Attending: Cardiovascular Disease | Admitting: Cardiovascular Disease

## 2017-02-04 DIAGNOSIS — Z953 Presence of xenogenic heart valve: Secondary | ICD-10-CM

## 2017-02-04 DIAGNOSIS — Z8679 Personal history of other diseases of the circulatory system: Secondary | ICD-10-CM | POA: Diagnosis not present

## 2017-02-04 DIAGNOSIS — Z951 Presence of aortocoronary bypass graft: Secondary | ICD-10-CM | POA: Diagnosis not present

## 2017-02-04 DIAGNOSIS — Z9889 Other specified postprocedural states: Secondary | ICD-10-CM | POA: Diagnosis not present

## 2017-02-04 NOTE — Progress Notes (Signed)
Daily Session Note  Patient Details  Name: Catherine [name] MRN: [id number] Date of Birth: 05-15-1935 Referring Provider:     CARDIAC REHAB PHASE II ORIENTATION from 12/23/2016 in [place]  Referring Provider  Dr. Oval Linsey      Encounter Date: 02/04/2017  Check In:     Session Check In - 02/04/17 0930      Check-In   Location [facility]   Staff Present Suzanne Boron, BS, EP, Exercise Physiologist;Misty Foutz Wynetta Emery, RN, BSN   Supervising physician immediately available to respond to emergencies See telemetry face sheet for immediately available MD   Medication changes reported     No   Fall or balance concerns reported    No   Warm-up and Cool-down Performed as group-led instruction   Resistance Training Performed Yes   VAD Patient? No     Pain Assessment   Currently in Pain? No/denies   Pain Score 0-No pain   Multiple Pain Sites No      Capillary Blood Glucose: No results found for this or any previous visit (from the past 24 hour(s)).    History  Smoking Status  . Former Smoker  . Packs/day: 1.00  . Years: 15.00  . Types: Cigarettes  . Quit date: 09/28/1975  Smokeless Tobacco  . Never Used    Goals Met:  Independence with exercise equipment Exercise tolerated well No report of cardiac concerns or symptoms Strength training completed today  Goals Unmet:  Not Applicable  Comments: Check out 1030.   Dr. [name] Sable is Medical Director for [facility] Cardiac and Pulmonary Rehab.

## 2017-02-07 ENCOUNTER — Telehealth: Payer: Self-pay | Admitting: *Deleted

## 2017-02-07 ENCOUNTER — Encounter (HOSPITAL_COMMUNITY)
Admission: RE | Admit: 2017-02-07 | Discharge: 2017-02-07 | Disposition: A | Discharge status: Home / Self Care | Payer: Medicare Other | Source: Ambulatory Visit | Attending: Cardiovascular Disease | Admitting: Cardiovascular Disease

## 2017-02-07 ENCOUNTER — Telehealth: Payer: Self-pay | Admitting: Cardiovascular Disease

## 2017-02-07 DIAGNOSIS — Z953 Presence of xenogenic heart valve: Secondary | ICD-10-CM | POA: Diagnosis not present

## 2017-02-07 DIAGNOSIS — Z951 Presence of aortocoronary bypass graft: Secondary | ICD-10-CM

## 2017-02-07 DIAGNOSIS — Z9889 Other specified postprocedural states: Secondary | ICD-10-CM | POA: Diagnosis not present

## 2017-02-07 DIAGNOSIS — Z8679 Personal history of other diseases of the circulatory system: Secondary | ICD-10-CM | POA: Diagnosis not present

## 2017-02-07 MED ORDER — FUROSEMIDE 40 MG PO TABS: ORAL_TABLET | ORAL | 11 refills | Status: DC

## 2017-02-07 MED ORDER — POTASSIUM CHLORIDE CRYS ER 10 MEQ PO TBCR: 10.0000 meq | EXTENDED_RELEASE_TABLET | Freq: Two times a day (BID) | ORAL | 11 refills | Status: DC

## 2017-02-07 NOTE — Telephone Encounter (Signed)
Patient left a msg on the refill vm requesting that you give her a call at [phone]. She stated that it is regards to the potassium. Thanks, MI

## 2017-02-07 NOTE — Progress Notes (Signed)
Daily Session Note  Patient Details  Name: Catherine [name] MRN: [id number] Date of Birth: [birth year] Referring Provider:     [place] from 12/23/2016 in [place]  Referring Provider  Dr. Oval Linsey      Encounter Date: 02/07/2017  Check In:     Session Check In - 02/07/17 0930      Check-In   Location [facility]   Staff Present Suzanne Boron, BS, EP, Exercise Physiologist;Dimitrious Micciche Wynetta Emery, RN, BSN   Supervising physician immediately available to respond to emergencies See telemetry face sheet for immediately available MD   Medication changes reported     No   Fall or balance concerns reported    No   Warm-up and Cool-down Performed as group-led instruction   Resistance Training Performed Yes   VAD Patient? No     Pain Assessment   Currently in Pain? No/denies   Pain Score 0-No pain   Multiple Pain Sites No      Capillary Blood Glucose: No results found for this or any previous visit (from the past 24 hour(s)).    History  Smoking Status  . Former Smoker  . Packs/day: 1.00  . Years: 15.00  . Types: Cigarettes  . Quit date: 09/28/1975  Smokeless Tobacco  . Never Used    Goals Met:  Independence with exercise equipment Exercise tolerated well No report of cardiac concerns or symptoms Strength training completed today  Goals Unmet:  Not Applicable  Comments: Check out 1030.   Dr. [name] Sable is Medical Director for [facility] Cardiac and Pulmonary Rehab.

## 2017-02-07 NOTE — Telephone Encounter (Signed)
Spoke with patient and she has been taking her Furosemide 40 mg daily and K+ 10 meq 2 daily. The K+ dose is different than her chart. Her last K+ level was 3.5 taking 2 daily done last week.  She had been instructed at last ov ok to take extra Furosemide as needed and if she did take 20 meq of K+. Patient requested new Rx's be sent to pharmacy. Sent as requested

## 2017-02-07 NOTE — Telephone Encounter (Signed)
New message     potassium chloride (K-DUR,KLOR-CON) 10 MEQ tablet Take 10 mEq by mouth 3 (three) times daily.    Needs updated prescription with the maxium dosage the pt is allowed to take in a day

## 2017-02-08 NOTE — Telephone Encounter (Signed)
rx with updated direction sent yesterday 02-07-17

## 2017-02-09 ENCOUNTER — Encounter (HOSPITAL_COMMUNITY)
Admission: RE | Admit: 2017-02-09 | Discharge: 2017-02-09 | Disposition: A | Discharge status: Home / Self Care | Payer: Medicare Other | Source: Ambulatory Visit | Attending: Cardiovascular Disease | Admitting: Cardiovascular Disease

## 2017-02-09 DIAGNOSIS — Z951 Presence of aortocoronary bypass graft: Secondary | ICD-10-CM

## 2017-02-09 DIAGNOSIS — Z9889 Other specified postprocedural states: Secondary | ICD-10-CM | POA: Diagnosis not present

## 2017-02-09 DIAGNOSIS — Z953 Presence of xenogenic heart valve: Secondary | ICD-10-CM | POA: Diagnosis not present

## 2017-02-09 DIAGNOSIS — Z8679 Personal history of other diseases of the circulatory system: Secondary | ICD-10-CM | POA: Diagnosis not present

## 2017-02-09 NOTE — Progress Notes (Signed)
Daily Session Note  Patient Details  Name: Catherine [name] MRN: [id number] Date of Birth: 1935-03-11 Referring Provider:     [place] from 12/23/2016 in [place]  Referring Provider  Dr. Oval Linsey      Encounter Date: 02/09/2017  Check In:     Session Check In - 02/09/17 0930      Check-In   Location [facility]   Staff Present Suzanne Boron, BS, EP, Exercise Physiologist;Helios Kohlmann Wynetta Emery, RN, BSN   Supervising physician immediately available to respond to emergencies See telemetry face sheet for immediately available MD   Medication changes reported     No   Fall or balance concerns reported    No   Warm-up and Cool-down Performed as group-led instruction   Resistance Training Performed Yes   VAD Patient? No     Pain Assessment   Currently in Pain? No/denies   Pain Score 0-No pain   Multiple Pain Sites No      Capillary Blood Glucose: No results found for this or any previous visit (from the past 24 hour(s)).    History  Smoking Status  . Former Smoker  . Packs/day: 1.00  . Years: 15.00  . Types: Cigarettes  . Quit date: 09/28/1975  Smokeless Tobacco  . Never Used    Goals Met:  Independence with exercise equipment Exercise tolerated well No report of cardiac concerns or symptoms Strength training completed today  Goals Unmet:  Not Applicable  Comments: Check out 1030.   Dr. [name] Sable is Medical Director for [facility] Cardiac and Pulmonary Rehab.

## 2017-02-11 ENCOUNTER — Encounter (HOSPITAL_COMMUNITY)
Admission: RE | Admit: 2017-02-11 | Discharge: 2017-02-11 | Disposition: A | Discharge status: Home / Self Care | Payer: Medicare Other | Source: Ambulatory Visit | Attending: Cardiovascular Disease | Admitting: Cardiovascular Disease

## 2017-02-11 DIAGNOSIS — Z953 Presence of xenogenic heart valve: Secondary | ICD-10-CM | POA: Diagnosis not present

## 2017-02-11 DIAGNOSIS — Z951 Presence of aortocoronary bypass graft: Secondary | ICD-10-CM | POA: Diagnosis not present

## 2017-02-11 DIAGNOSIS — Z9889 Other specified postprocedural states: Secondary | ICD-10-CM | POA: Diagnosis not present

## 2017-02-11 DIAGNOSIS — Z8679 Personal history of other diseases of the circulatory system: Secondary | ICD-10-CM | POA: Diagnosis not present

## 2017-02-11 NOTE — Progress Notes (Signed)
Daily Session Note  Patient Details  Name: Catherine [name] MRN: [id number] Date of Birth: 1934/11/14 Referring Provider:     CARDIAC REHAB [place] from 12/23/2016 in [place]  Referring Provider  Dr. Oval Linsey      Encounter Date: 02/11/2017  Check In:     Session Check In - 02/11/17 0930      Check-In   Location [facility]   Staff Present Russella Dar, MS, EP, [facility], Exercise Physiologist;Gregory Luther Parody, BS, EP, Exercise Physiologist   Supervising physician immediately available to respond to emergencies See telemetry face sheet for immediately available MD   Medication changes reported     No   Fall or balance concerns reported    No   Tobacco Cessation No Change   Warm-up and Cool-down Performed as group-led instruction   Resistance Training Performed Yes   VAD Patient? No     Pain Assessment   Currently in Pain? No/denies   Multiple Pain Sites No      Capillary Blood Glucose: No results found for this or any previous visit (from the past 24 hour(s)).    History  Smoking Status  . Former Smoker  . Packs/day: 1.00  . Years: 15.00  . Types: Cigarettes  . Quit date: 09/28/1975  Smokeless Tobacco  . Never Used    Goals Met:  Independence with exercise equipment Exercise tolerated well No report of cardiac concerns or symptoms Strength training completed today  Goals Unmet:  Not Applicable  Comments: Check out: 1030   Dr. [name] Sable is Medical Director for [place] and Pulmonary Rehab.

## 2017-02-14 ENCOUNTER — Encounter (HOSPITAL_COMMUNITY)
Admission: RE | Admit: 2017-02-14 | Discharge: 2017-02-14 | Disposition: A | Discharge status: Home / Self Care | Payer: Medicare Other | Source: Ambulatory Visit | Attending: Cardiovascular Disease | Admitting: Cardiovascular Disease

## 2017-02-14 DIAGNOSIS — Z951 Presence of aortocoronary bypass graft: Secondary | ICD-10-CM | POA: Diagnosis not present

## 2017-02-14 DIAGNOSIS — Z8679 Personal history of other diseases of the circulatory system: Secondary | ICD-10-CM | POA: Diagnosis not present

## 2017-02-14 DIAGNOSIS — Z9889 Other specified postprocedural states: Secondary | ICD-10-CM | POA: Diagnosis not present

## 2017-02-14 DIAGNOSIS — Z953 Presence of xenogenic heart valve: Secondary | ICD-10-CM | POA: Diagnosis not present

## 2017-02-14 NOTE — Progress Notes (Signed)
Daily Session Note  Patient Details  Name: Catherine [name] MRN: [id number] Date of Birth: 1935-04-15 Referring Provider:     CARDIAC REHAB [place] from 12/23/2016 in [place]  Referring Provider  Dr. Oval Linsey      Encounter Date: 02/14/2017  Check In:     Session Check In - 02/14/17 0930      Check-In   Location [facility]   Staff Present Russella Dar, MS, EP, [facility], Exercise Physiologist;Gregory Luther Parody, BS, EP, Exercise Physiologist   Supervising physician immediately available to respond to emergencies See telemetry face sheet for immediately available MD   Medication changes reported     No   Fall or balance concerns reported    No   Tobacco Cessation No Change   Warm-up and Cool-down Performed as group-led instruction   Resistance Training Performed Yes   VAD Patient? No     Pain Assessment   Currently in Pain? No/denies   Pain Score 0-No pain   Multiple Pain Sites No      Capillary Blood Glucose: No results found for this or any previous visit (from the past 24 hour(s)).    History  Smoking Status  . Former Smoker  . Packs/day: 1.00  . Years: 15.00  . Types: Cigarettes  . Quit date: 09/28/1975  Smokeless Tobacco  . Never Used    Goals Met:  Independence with exercise equipment Exercise tolerated well No report of cardiac concerns or symptoms Strength training completed today  Goals Unmet:  Not Applicable  Comments: Check out: 1030   Dr. [name] Sable is Medical Director for [place] and Pulmonary Rehab.

## 2017-02-15 ENCOUNTER — Ambulatory Visit (HOSPITAL_COMMUNITY): Payer: Medicare Other | Attending: Cardiovascular Disease

## 2017-02-15 ENCOUNTER — Other Ambulatory Visit: Payer: Self-pay

## 2017-02-15 DIAGNOSIS — Z953 Presence of xenogenic heart valve: Secondary | ICD-10-CM | POA: Diagnosis not present

## 2017-02-15 DIAGNOSIS — I081 Rheumatic disorders of both mitral and tricuspid valves: Secondary | ICD-10-CM | POA: Diagnosis not present

## 2017-02-15 DIAGNOSIS — R0602 Shortness of breath: Secondary | ICD-10-CM

## 2017-02-15 DIAGNOSIS — I480 Paroxysmal atrial fibrillation: Secondary | ICD-10-CM | POA: Diagnosis not present

## 2017-02-16 ENCOUNTER — Other Ambulatory Visit: Payer: Self-pay | Admitting: Family Medicine

## 2017-02-16 ENCOUNTER — Encounter (HOSPITAL_COMMUNITY)
Admission: RE | Admit: 2017-02-16 | Discharge: 2017-02-16 | Disposition: A | Discharge status: Home / Self Care | Payer: Medicare Other | Source: Ambulatory Visit | Attending: Cardiovascular Disease | Admitting: Cardiovascular Disease

## 2017-02-16 DIAGNOSIS — Z951 Presence of aortocoronary bypass graft: Secondary | ICD-10-CM | POA: Diagnosis not present

## 2017-02-16 DIAGNOSIS — Z953 Presence of xenogenic heart valve: Secondary | ICD-10-CM | POA: Diagnosis not present

## 2017-02-16 DIAGNOSIS — Z9889 Other specified postprocedural states: Secondary | ICD-10-CM | POA: Diagnosis not present

## 2017-02-16 DIAGNOSIS — Z8679 Personal history of other diseases of the circulatory system: Secondary | ICD-10-CM | POA: Diagnosis not present

## 2017-02-16 NOTE — Progress Notes (Signed)
Cardiac Individual Treatment Plan  Patient Details  Name: Catherine [name] MRN: [id number] Date of Birth: 1935-06-22 Referring Provider:     CARDIAC REHAB PHASE II ORIENTATION from 12/23/2016 in [place]  Referring Provider  Dr. Oval Linsey      Initial Encounter Date:    CARDIAC REHAB PHASE II ORIENTATION from 12/23/2016 in [place]  Date  12/23/16  Referring Provider  Dr. Oval Linsey      Visit Diagnosis: Status post mitral valve replacement with bioprosthetic valve  S/P CABG x 1  Patient's Home Medications on Admission:  Current Outpatient Prescriptions:  .  acetaminophen (TYLENOL) 500 MG tablet, Take 500 mg by mouth every 6 (six) hours as needed for mild pain., Disp: , Rfl:  .  aspirin EC 81 MG EC tablet, Take 1 tablet (81 mg total) by mouth daily., Disp: , Rfl:  .  B Complex-C (B-COMPLEX WITH VITAMIN C) tablet, Take 1 tablet by mouth daily., Disp: , Rfl:  .  diazepam (VALIUM) 5 MG tablet, Take one half tablet each morning and one at night prn anxiety (Patient taking differently: Take 2.5-5 mg by mouth 2 (two) times daily. Take one half tablet each morning if needed for anxiety and 1 tablet at bedtime every night.), Disp: 45 tablet, Rfl: 3 .  furosemide (LASIX) 40 MG tablet, Take 1 tablet by mouth daily. Ok to take an extra tablet as needed for swelling., Disp: 40 tablet, Rfl: 11 .  Multiple Vitamin (MULTIVITAMIN) tablet, Take 1 tablet by mouth daily.  , Disp: , Rfl:  .  nebivolol (BYSTOLIC) 10 MG tablet, Take 1 tablet (10 mg total) by mouth daily., Disp: 30 tablet, Rfl: 5 .  OVER THE COUNTER MEDICATION, Apply 1 application topically at bedtime as needed (Leg Pain). Magni-Life topical cream for leg pain., Disp: , Rfl:  .  potassium chloride (K-DUR,KLOR-CON) 10 MEQ tablet, Take 1 tablet (10 mEq total) by mouth 2 (two) times daily. Take an extra 2 tablets on days you take extra Furosemide, Disp: 90 tablet, Rfl: 11 .  rosuvastatin (CRESTOR) 5  MG tablet, Take 1 tablet (5 mg total) by mouth daily at 6 PM., Disp: 90 tablet, Rfl: 0 .  SYMBICORT 80-4.5 MCG/ACT inhaler, 2 PUFFS 2 TIMES A DAY, Disp: 10.2 g, Rfl: 0 .  triamterene-hydrochlorothiazide (MAXZIDE-25) 37.5-25 MG tablet, Take 1 tablet by mouth daily., Disp: 90 tablet, Rfl: 2 .  warfarin (COUMADIN) 2 MG tablet, Take 1 tablet (2 mg total) by mouth daily at 6 PM. As directed by the coumadin clinic (Patient taking differently: Take 1-2 mg by mouth daily at 6 PM. As directed by the coumadin clinic,  Currently 1 tablet daily except 1/2 tablet on Monday - Wednesday - Friday.), Disp: 100 tablet, Rfl: 1  Past Medical History: Past Medical History:  Diagnosis Date  . Arthritis   . Asthma    last attack 02/2015  . Atrial fibrillation, chronic ([place])   . Cellulitis of left lower extremity 08/17/2016   Ulcer associated with severe venous insufficiency  . Chronic anticoagulation   . Chronic diastolic CHF (congestive heart failure) ([place])   . Chronic kidney disease    "RIGHT MANY KIDNEY INFECTIONS AND STONES"  . COPD (chronic obstructive pulmonary disease) ([place])   . Coronary artery disease   . Dizziness   . H/O: rheumatic fever   . Heart murmur   . Hypertension   . PONV (postoperative nausea and vomiting)    ' SOMETIMES', BUT NOT ALWAYS"  .  S/P Maze operation for atrial fibrillation 10/14/2016   Complete bilateral atrial lesion set using cryothermy and bipolar radiofrequency ablation - atrial appendage was not treated due to previous surgical procedure (open mitral commissurotomy)  . S/P mitral valve replacement with bioprosthetic valve 10/14/2016   29 mm Medtronic Mosaic porcine bioprosthetic tissue valve  . UTI (urinary tract infection)   . Valvular heart disease    Has mitral stenosis with prior mitral commissurotomy in 1970    Tobacco Use: History  Smoking Status  . Former Smoker  . Packs/day: 1.00  . Years: 15.00  . Types: Cigarettes  . Quit date: 09/28/1975  Smokeless Tobacco   . Never Used    Labs: Recent Review Flowsheet Data    Labs for ITP Cardiac and Pulmonary Rehab Latest Ref Rng & Units 10/15/2016 10/15/2016 10/15/2016 10/15/2016 12/20/2016   Cholestrol 0 - 200 mg/dL - - - - 158   LDLCALC 0 - 99 mg/dL - - - - 61   LDLDIRECT mg/dL - - - - -   HDL >39.00 mg/dL - - - - 60.00   Trlycerides 0.0 - 149.0 mg/dL - - - - 184.0(H)   Hemoglobin A1c 4.8 - 5.6 % - - - - -   PHART 7.350 - 7.450 7.344(L) 7.347(L) 7.330(L) - -   PCO2ART 32.0 - 48.0 mmHg 48.2(H) 44.8 48.7(H) - -   HCO3 20.0 - 28.0 mmol/L 26.0 24.4 25.5 - -   TCO2 0 - 100 mmol/L 27 26 27 25  -   ACIDBASEDEF 0.0 - 2.0 mmol/L - 1.0 - - -   O2SAT % 99.0 99.0 99.0 - -      Capillary Blood Glucose: Lab Results  Component Value Date   GLUCAP 138 (H) 10/16/2016   GLUCAP 117 (H) 10/16/2016   GLUCAP 108 (H) 10/16/2016   GLUCAP 107 (H) 10/16/2016   GLUCAP 106 (H) 10/16/2016     Exercise Target Goals:    Exercise Program Goal: Individual exercise prescription set with THRR, safety & activity barriers. Participant demonstrates ability to understand and report RPE using BORG scale, to self-measure pulse accurately, and to acknowledge the importance of the exercise prescription.  Exercise Prescription Goal: Starting with aerobic activity 30 plus minutes a day, 3 days per week for initial exercise prescription. Provide home exercise prescription and guidelines that participant acknowledges understanding prior to discharge.  Activity Barriers & Risk Stratification:   6 Minute Walk:     6 Minute Walk    Row Name 12/23/16 1108         6 Minute Walk   Phase Initial     Distance 1200 feet     Distance % Change 0 %     Walk Time 6 minutes     # of Rest Breaks 0     MPH 2.27     METS 2.74     RPE 13     Perceived Dyspnea  13     VO2 Peak 7.01     Symptoms No     Resting HR 53 bpm     Resting BP 120/56     Max Ex. HR 71 bpm     Max Ex. BP 150/66     2 Minute Post BP 126/60        Oxygen  Initial Assessment:     Oxygen Initial Assessment - 12/23/16 1047      Home Oxygen   Home Oxygen Device None   Sleep Oxygen Prescription None  Home Exercise Oxygen Prescription None   Home at Rest Exercise Oxygen Prescription None     Initial 6 min Walk   Oxygen Used None     Program Oxygen Prescription   Program Oxygen Prescription None      Oxygen Re-Evaluation:   Oxygen Discharge (Final Oxygen Re-Evaluation):   Initial Exercise Prescription:     Initial Exercise Prescription - 12/23/16 1100      Date of Initial Exercise RX and Referring Provider   Date 12/23/16   Referring Provider Dr. Oval Linsey     Treadmill   MPH 2   Grade 0   Minutes 15   METs 2.5     NuStep   Level 2   SPM 17   Minutes 20   METs 1.9     Prescription Details   Frequency (times per week) 3   Duration Progress to 30 minutes of continuous aerobic without signs/symptoms of physical distress     Intensity   THRR 40-80% of Max Heartrate [phone]   Ratings of Perceived Exertion 11-13   Perceived Dyspnea 0-4     Progression   Progression Continue progressive overload as per policy without signs/symptoms or physical distress.     Resistance Training   Training Prescription Yes   Weight 1   Reps 10-15      Perform Capillary Blood Glucose checks as needed.  Exercise Prescription Changes:      Exercise Prescription Changes    Row Name 12/28/16 1300 01/17/17 1400 02/11/17 1400         Response to Exercise   Blood Pressure (Admit) 126/60 130/68 128/78     Blood Pressure (Exercise) 130/88 146/58 148/60     Blood Pressure (Exit) 110/60 130/60 122/70     Heart Rate (Admit) 50 bpm 46 bpm 49 bpm     Heart Rate (Exercise) 79 bpm 57 bpm 58 bpm     Heart Rate (Exit) 55 bpm 55 bpm 55 bpm     Rating of Perceived Exertion (Exercise) 9 11 11      Duration Progress to 30 minutes of  aerobic without signs/symptoms of physical distress Progress to 30 minutes of  aerobic without  signs/symptoms of physical distress Progress to 30 minutes of  aerobic without signs/symptoms of physical distress     Intensity THRR unchanged THRR unchanged THRR unchanged       Progression   Progression Continue to progress workloads to maintain intensity without signs/symptoms of physical distress. Continue to progress workloads to maintain intensity without signs/symptoms of physical distress. Continue to progress workloads to maintain intensity without signs/symptoms of physical distress.       Resistance Training   Training Prescription Yes Yes Yes     Weight 1 2 2      Reps 10-15 10-15 10-15       Treadmill   MPH 2 2.2 2.3     Grade 0 0 0     Minutes 15 15 15      METs 2.5 2.7 2.8       NuStep   Level 2 3 3      SPM 14 18 25      Minutes 20 20 20      METs 3.44 3.45 3.45       Home Exercise Plan   Plans to continue exercise at Home (comment) Home (comment) Home (comment)     Frequency Add 2 additional days to program exercise sessions. Add 2 additional days to program exercise sessions. Add 2 additional  days to program exercise sessions.        Exercise Comments:      Exercise Comments    Row Name 12/28/16 1306 01/17/17 1413 02/11/17 1436       Exercise Comments Patient has just started CR and will be progressed in time. Watts before level increase. Patient is progressing very well in CR.  Patient is doing well in CR and is maintaining her levels        Exercise Goals and Review:      Exercise Goals    Row Name 12/23/16 1047             Exercise Goals   Increase Physical Activity Yes       Intervention Provide advice, education, support and counseling about physical activity/exercise needs.;Develop an individualized exercise prescription for aerobic and resistive training based on initial evaluation findings, risk stratification, comorbidities and participant's personal goals.       Expected Outcomes Achievement of increased cardiorespiratory fitness and  enhanced flexibility, muscular endurance and strength shown through measurements of functional capacity and personal statement of participant.       Increase Strength and Stamina Yes       Intervention Provide advice, education, support and counseling about physical activity/exercise needs.;Develop an individualized exercise prescription for aerobic and resistive training based on initial evaluation findings, risk stratification, comorbidities and participant's personal goals.       Expected Outcomes Achievement of increased cardiorespiratory fitness and enhanced flexibility, muscular endurance and strength shown through measurements of functional capacity and personal statement of participant.          Exercise Goals Re-Evaluation :     Exercise Goals Re-Evaluation    Row Name 02/16/17 1426             Exercise Goal Re-Evaluation   Exercise Goals Review Increase Physical Activity;Increase Strenth and Stamina  More energy; do ADL's without fatige.        Comments After completing 22 sessions, patient continues to progress with increased strength and stamina. She says has more energy and is able do do more ADL's.        Expected Outcomes Patient will complete the program with continued increased strength, stamina, and activity.           Discharge Exercise Prescription (Final Exercise Prescription Changes):     Exercise Prescription Changes - 02/11/17 1400      Response to Exercise   Blood Pressure (Admit) 128/78   Blood Pressure (Exercise) 148/60   Blood Pressure (Exit) 122/70   Heart Rate (Admit) 49 bpm   Heart Rate (Exercise) 58 bpm   Heart Rate (Exit) 55 bpm   Rating of Perceived Exertion (Exercise) 11   Duration Progress to 30 minutes of  aerobic without signs/symptoms of physical distress   Intensity THRR unchanged     Progression   Progression Continue to progress workloads to maintain intensity without signs/symptoms of physical distress.     Resistance Training    Training Prescription Yes   Weight 2   Reps 10-15     Treadmill   MPH 2.3   Grade 0   Minutes 15   METs 2.8     NuStep   Level 3   SPM 25   Minutes 20   METs 3.45     Home Exercise Plan   Plans to continue exercise at Home (comment)   Frequency Add 2 additional days to program exercise sessions.      Nutrition:  Target Goals: Understanding of nutrition guidelines, daily intake of sodium <1577m, cholesterol <2034m calories 30% from fat and 7% or less from saturated fats, daily to have 5 or more servings of fruits and vegetables.  Biometrics:     Pre Biometrics - 12/23/16 1111      Pre Biometrics   Height 5' 3"  (1.6 m)   Weight 152 lb 5.4 oz (69.1 kg)   Waist Circumference 35 inches   Hip Circumference 38 inches   Waist to Hip Ratio 0.92 %   BMI (Calculated) 27   Triceps Skinfold 18 mm   % Body Fat 37.3 %   Grip Strength 43 kg   Flexibility 0 in   Single Leg Stand 5 seconds       Nutrition Therapy Plan and Nutrition Goals:   Nutrition Discharge: Rate Your Plate Scores:     Nutrition Assessments - 12/23/16 1048      MEDFICTS Scores   Pre Score 50      Nutrition Goals Re-Evaluation:   Nutrition Goals Discharge (Final Nutrition Goals Re-Evaluation):   Psychosocial: Target Goals: Acknowledge presence or absence of significant depression and/or stress, maximize coping skills, provide positive support system. Participant is able to verbalize types and ability to use techniques and skills needed for reducing stress and depression.  Initial Review & Psychosocial Screening:     Initial Psych Review & Screening - 12/23/16 1119      Initial Review   Current issues with Current Depression;Current Sleep Concerns     Family Dynamics   Good Support System? Yes     Barriers   Psychosocial barriers to participate in program The patient should benefit from training in stress management and relaxation.;Psychosocial barriers identified (see note)   Patient's QOL score was 22.93 and her PHQ-9 score was 11 indicating some depression.      Screening Interventions   Interventions Encouraged to exercise  Patient says her depressed feeling started after her surgery and is related to her lack of energy to do anything. She feels she will improve when she feels more like doing things.       Quality of Life Scores:     Quality of Life - 12/23/16 1112      Quality of Life Scores   Health/Function Pre 20.3 %   Socioeconomic Pre 25.5 %   Psych/Spiritual Pre 24.86 %   Family Pre 24 %   GLOBAL Pre 22.93 %      PHQ-9: Recent Review Flowsheet Data    [facility] 12/23/2016 12/20/2016 11/21/2015 12/30/2014 12/05/2013   Decreased Interest 0 0 0 0 0   Down, Depressed, Hopeless 2 1  0 0 0   PHQ - 2 Score 2 1 0 0 0   Altered sleeping 3 - - - -   Tired, decreased energy 3 - - - -   Change in appetite 0 - - - -   Feeling bad or failure about yourself  2 - - - -   Trouble concentrating 1 - - - -   Moving slowly or fidgety/restless 0 - - - -   Suicidal thoughts 0 - - - -   PHQ-9 Score 11 - - - -   Difficult doing work/chores Somewhat difficult - - - -     Interpretation of Total Score  Total Score Depression Severity:  1-4 = Minimal depression, 5-9 = Mild depression, 10-14 = Moderate depression, 15-19 = Moderately severe depression, 20-27 = Severe depression  Psychosocial Evaluation and Intervention:     Psychosocial Evaluation - 12/23/16 1122      Psychosocial Evaluation & Interventions   Interventions Stress management education;Relaxation education;Encouraged to exercise with the program and follow exercise prescription   Comments Patient says her depressed feeling started after her surgery and is related to her lack of energy to do a lot.    Expected Outcomes Patient will complete the program meeting her goal of improved energy which will improve her depressed feeling.    Continue Psychosocial Services  Follow up  required by staff      Psychosocial Re-Evaluation:     Psychosocial Re-Evaluation    [place] Name 01/19/17 1356 02/16/17 1429           Psychosocial Re-Evaluation   Current issues with None Identified Current Sleep Concerns      Comments  - Patient continues to want to sleep more during the day but has improved. She feels better on the days she comes to CR when she has to get up earlier. She feels more motivated on those days. Will continue to monitor. She continues to take diazepam for sleep and anxiety.       Expected Outcomes Patient will have no psychosocial issues identified at discharge.  Patient will have improved PHQ-9 and QOL scores at discharge.       Interventions Encouraged to attend Cardiac Rehabilitation for the exercise Relaxation education;Encouraged to attend Cardiac Rehabilitation for the exercise;Stress management education      Continue Psychosocial Services  No Follow up required Follow up required by staff         Psychosocial Discharge (Final Psychosocial Re-Evaluation):     Psychosocial Re-Evaluation - 02/16/17 1429      Psychosocial Re-Evaluation   Current issues with Current Sleep Concerns   Comments Patient continues to want to sleep more during the day but has improved. She feels better on the days she comes to CR when she has to get up earlier. She feels more motivated on those days. Will continue to monitor. She continues to take diazepam for sleep and anxiety.    Expected Outcomes Patient will have improved PHQ-9 and QOL scores at discharge.    Interventions Relaxation education;Encouraged to attend Cardiac Rehabilitation for the exercise;Stress management education   Continue Psychosocial Services  Follow up required by staff      Vocational Rehabilitation: Provide vocational rehab assistance to qualifying candidates.   Vocational Rehab Evaluation & Intervention:     Vocational Rehab - 12/23/16 1046      Initial Vocational Rehab Evaluation &  Intervention   Assessment shows need for Vocational Rehabilitation No      Education: Education Goals: Education classes will be provided on a weekly basis, covering required topics. Participant will state understanding/return demonstration of topics presented.  Learning Barriers/Preferences:     Learning Barriers/Preferences - 12/23/16 1045      Learning Barriers/Preferences   Learning Barriers None   Learning Preferences Skilled Demonstration;Audio;Individual Instruction;Verbal Instruction      Education Topics: Hypertension, Hypertension Reduction -Define heart disease and high blood pressure. Discus how high blood pressure affects the body and ways to reduce high blood pressure.   CARDIAC REHAB PHASE II EXERCISE from 02/09/2017 in [place]  Date  01/26/17  Educator  Russella Dar  Instruction Review Code  2- meets goals/outcomes      Exercise and Your Heart -Discuss why it is important to exercise, the FITT principles of exercise, normal and abnormal  responses to exercise, and how to exercise safely.   CARDIAC REHAB PHASE II EXERCISE from 02/09/2017 in [place]  Date  02/02/17  Educator  DC  Instruction Review Code  2- meets goals/outcomes      Angina -Discuss definition of angina, causes of angina, treatment of angina, and how to decrease risk of having angina.   CARDIAC REHAB PHASE II EXERCISE from 02/09/2017 in [place]  Date  02/09/17  Educator  DC  Instruction Review Code  2- meets goals/outcomes      Cardiac Medications -Review what the following cardiac medications are used for, how they affect the body, and side effects that may occur when taking the medications.  Medications include Aspirin, Beta blockers, calcium channel blockers, ACE Inhibitors, angiotensin receptor blockers, diuretics, digoxin, and antihyperlipidemics.   Congestive Heart Failure -Discuss the definition of CHF, how  to live with CHF, the signs and symptoms of CHF, and how keep track of weight and sodium intake.   Heart Disease and Intimacy -Discus the effect sexual activity has on the heart, how changes occur during intimacy as we age, and safety during sexual activity.   Smoking Cessation / COPD -Discuss different methods to quit smoking, the health benefits of quitting smoking, and the definition of COPD.   Nutrition I: Fats -Discuss the types of cholesterol, what cholesterol does to the heart, and how cholesterol levels can be controlled.   Nutrition II: Labels -Discuss the different components of food labels and how to read food label   Heart Parts and Heart Disease -Discuss the anatomy of the heart, the pathway of blood circulation through the heart, and these are affected by heart disease.   CARDIAC REHAB PHASE II EXERCISE from 02/09/2017 in [place]  Date  12/29/16  Educator  DJ  Instruction Review Code  2- meets goals/outcomes      Stress I: Signs and Symptoms -Discuss the causes of stress, how stress may lead to anxiety and depression, and ways to limit stress.   CARDIAC REHAB PHASE II EXERCISE from 02/09/2017 in [place]  Date  01/05/17  Educator  D. Coad  Instruction Review Code  2- meets goals/outcomes      Stress II: Relaxation -Discuss different types of relaxation techniques to limit stress.   CARDIAC REHAB PHASE II EXERCISE from 02/09/2017 in [place]  Date  01/12/17  Educator  DJ  Instruction Review Code  2- meets goals/outcomes      Warning Signs of Stroke / TIA -Discuss definition of a stroke, what the signs and symptoms are of a stroke, and how to identify when someone is having stroke.   CARDIAC REHAB PHASE II EXERCISE from 02/09/2017 in [place]  Date  01/19/17  Educator  Dc  Instruction Review Code  2- meets goals/outcomes      Knowledge Questionnaire  Score:     Knowledge Questionnaire Score - 12/23/16 1046      Knowledge Questionnaire Score   Pre Score 21/28      Core Components/Risk Factors/Patient Goals at Admission:     Personal Goals and Risk Factors at Admission - 12/23/16 1048      Core Components/Risk Factors/Patient Goals on Admission    Weight Management Yes   Intervention Weight Management: Develop a combined nutrition and exercise program designed to reach desired caloric intake, while maintaining appropriate intake of nutrient and fiber, sodium and fats, and appropriate energy expenditure required  for the weight goal.;Weight Management: Provide education and appropriate resources to help participant work on and attain dietary goals.;Weight Management/Obesity: Establish reasonable short term and long term weight goals.;Obesity: Provide education and appropriate resources to help participant work on and attain dietary goals.   Admit Weight 152 lb 6.4 oz (69.1 kg)   Goal Weight: Short Term 144 lb 6.4 oz (65.5 kg)   Goal Weight: Long Term 134 lb 6.4 oz (61 kg)   Expected Outcomes Long Term: Adherence to nutrition and physical activity/exercise program aimed toward attainment of established weight goal;Short Term: Continue to assess and modify interventions until short term weight is achieved   Improve shortness of breath with ADL's Yes   Intervention Provide education, individualized exercise plan and daily activity instruction to help decrease symptoms of SOB with activities of daily living.   Expected Outcomes Short Term: Achieves a reduction of symptoms when performing activities of daily living.   Personal Goal Other Yes   Personal Goal Have more energy; increase strength and stamina; lose 10 lbs.    Intervention Patient will attend CR 3 days/week and supplement with exercise 2 days/week at home.    Expected Outcomes Patient will meet her personal goals.       Core Components/Risk Factors/Patient Goals Review:       Goals and Risk Factor Review    Row Name 12/23/16 1051 01/19/17 1354 02/16/17 1425         Core Components/Risk Factors/Patient Goals Review   Personal Goals Review Weight Management/Obesity;Improve shortness of breath with ADL's Weight Management/Obesity;Improve shortness of breath with ADL's Weight Management/Obesity;Improve shortness of breath with ADL's     Review  - Patient has completed 11 sessions gaining 0.6 lbs. She reports some improvement in her SOB. Will continue to monitor. Patient has completed 22 sessions gaining 1.6 lbs. Her SOB has improved and she says she feels better overall. She is doing well in the program.     Expected Outcomes  - Patient will complete the program meeting her personal goals.  Patient will complete the program meeting her personal goals.         Core Components/Risk Factors/Patient Goals at Discharge (Final Review):      Goals and Risk Factor Review - 02/16/17 1425      Core Components/Risk Factors/Patient Goals Review   Personal Goals Review Weight Management/Obesity;Improve shortness of breath with ADL's   Review Patient has completed 22 sessions gaining 1.6 lbs. Her SOB has improved and she says she feels better overall. She is doing well in the program.   Expected Outcomes Patient will complete the program meeting her personal goals.       ITP Comments:     ITP Comments    Row Name 12/29/16 1416 12/30/16 1537         ITP Comments Patient new to the program completing 3 sessions. Will continue to monitor for progress.  Patient met with Registered Dietitian to discuss nutrition topics including: Heart healthty eating, heart health cooking and make smart choices when shopping; Portion control; weight management; and hydration. Patient attended a group session with the hospital chaplian called [facility] to discuss and share how this recent diagnosis has effected their life         Comments: ITP 30 Day REVIEW Patient doing well in the  program. Will continue to monitor for progress.

## 2017-02-16 NOTE — Progress Notes (Signed)
Daily Session Note  Patient Details  Name: Catherine [name] MRN: [id number] Date of Birth: 1935-08-08 Referring Provider:     CARDIAC REHAB PHASE II ORIENTATION from 12/23/2016 in [place]  Referring Provider  Dr. Oval Linsey      Encounter Date: 02/16/2017  Check In:     Session Check In - 02/16/17 0930      Check-In   Location [facility]   Staff Present Suzanne Boron, BS, EP, Exercise Physiologist;Maty Zeisler Wynetta Emery, RN, BSN   Supervising physician immediately available to respond to emergencies See telemetry face sheet for immediately available MD   Medication changes reported     No   Fall or balance concerns reported    No   Warm-up and Cool-down Performed as group-led instruction   Resistance Training Performed Yes   VAD Patient? No     Pain Assessment   Currently in Pain? No/denies   Pain Score 0-No pain   Multiple Pain Sites No      Capillary Blood Glucose: No results found for this or any previous visit (from the past 24 hour(s)).    History  Smoking Status  . Former Smoker  . Packs/day: 1.00  . Years: 15.00  . Types: Cigarettes  . Quit date: 09/28/1975  Smokeless Tobacco  . Never Used    Goals Met:  Independence with exercise equipment Exercise tolerated well No report of cardiac concerns or symptoms Strength training completed today  Goals Unmet:  Not Applicable  Comments: Check out 1030.   Dr. [name] Sable is Medical Director for [facility] Cardiac and Pulmonary Rehab.

## 2017-02-17 ENCOUNTER — Other Ambulatory Visit: Payer: Medicare Other

## 2017-02-18 ENCOUNTER — Encounter (HOSPITAL_COMMUNITY)
Admission: RE | Admit: 2017-02-18 | Discharge: 2017-02-18 | Disposition: A | Discharge status: Home / Self Care | Payer: Medicare Other | Source: Ambulatory Visit | Attending: Cardiovascular Disease | Admitting: Cardiovascular Disease

## 2017-02-18 DIAGNOSIS — Z953 Presence of xenogenic heart valve: Secondary | ICD-10-CM

## 2017-02-18 DIAGNOSIS — Z8679 Personal history of other diseases of the circulatory system: Secondary | ICD-10-CM | POA: Diagnosis not present

## 2017-02-18 DIAGNOSIS — Z9889 Other specified postprocedural states: Secondary | ICD-10-CM | POA: Diagnosis not present

## 2017-02-18 DIAGNOSIS — Z951 Presence of aortocoronary bypass graft: Secondary | ICD-10-CM | POA: Diagnosis not present

## 2017-02-18 NOTE — Progress Notes (Signed)
Daily Session Note  Patient Details  Name: Catherine [name] MRN: [id number] Date of Birth: December 18, 1934 Referring Provider:     CARDIAC REHAB PHASE II ORIENTATION from 12/23/2016 in [place]  Referring Provider  Dr. Oval Linsey      Encounter Date: 02/18/2017  Check In:     Session Check In - 02/18/17 0938      Check-In   Location [facility]   Staff Present Russella Dar, MS, EP, [facility], Exercise Physiologist;Knute Mazzuca Luther Parody, BS, EP, Exercise Physiologist   Supervising physician immediately available to respond to emergencies See telemetry face sheet for immediately available MD   Medication changes reported     No   Fall or balance concerns reported    No   Warm-up and Cool-down Performed as group-led instruction   Resistance Training Performed Yes   VAD Patient? No     Pain Assessment   Currently in Pain? No/denies   Pain Score 0-No pain   Multiple Pain Sites No      Capillary Blood Glucose: No results found for this or any previous visit (from the past 24 hour(s)).    History  Smoking Status  . Former Smoker  . Packs/day: 1.00  . Years: 15.00  . Types: Cigarettes  . Quit date: 09/28/1975  Smokeless Tobacco  . Never Used    Goals Met:  Independence with exercise equipment Exercise tolerated well No report of cardiac concerns or symptoms Strength training completed today  Goals Unmet:  Not Applicable  Comments: Check out 1030   Dr. [name] Sable is Medical Director for [place] and Pulmonary Rehab.

## 2017-02-21 ENCOUNTER — Encounter (HOSPITAL_COMMUNITY): Payer: Medicare Other

## 2017-02-22 ENCOUNTER — Telehealth: Payer: Self-pay | Admitting: *Deleted

## 2017-02-22 NOTE — Telephone Encounter (Signed)
-----   Message from [name], MD sent at 02/22/2017  9:14 AM EDT ----- Echo shows that her bioprosthetic mitral valve looks good.  There is very mild aortic stenosis but there is nothing to do about this but continue to monitor.  Heart is squeezing well.

## 2017-02-22 NOTE — Telephone Encounter (Signed)
Recommend she take 80mg  in the AM and 40mg  in the PM for 3 days.  Then 40 mg bid

## 2017-02-22 NOTE — Telephone Encounter (Signed)
Advised patient of Echo results  Patient stated she continues to have issues with fatigue and shortness of breath. In the afternoons if she does not take a second Furosemide states can hardly go and shortness of breath worse. If she does not take the Furosemide does have some increased swelling. She does take 2 extra K+ daily. Scheduled for follow up 03/08/17. Will forward to Dr Oval Linsey for review

## 2017-02-23 ENCOUNTER — Encounter (HOSPITAL_COMMUNITY)
Admission: RE | Admit: 2017-02-23 | Discharge: 2017-02-23 | Disposition: A | Discharge status: Home / Self Care | Payer: Medicare Other | Source: Ambulatory Visit | Attending: Cardiovascular Disease | Admitting: Cardiovascular Disease

## 2017-02-23 DIAGNOSIS — Z953 Presence of xenogenic heart valve: Secondary | ICD-10-CM

## 2017-02-23 DIAGNOSIS — Z9889 Other specified postprocedural states: Secondary | ICD-10-CM | POA: Diagnosis not present

## 2017-02-23 DIAGNOSIS — Z951 Presence of aortocoronary bypass graft: Secondary | ICD-10-CM

## 2017-02-23 DIAGNOSIS — Z8679 Personal history of other diseases of the circulatory system: Secondary | ICD-10-CM | POA: Diagnosis not present

## 2017-02-23 MED ORDER — FUROSEMIDE 40 MG PO TABS: ORAL_TABLET | ORAL | 11 refills | Status: DC

## 2017-02-23 NOTE — Progress Notes (Signed)
Daily Session Note  Patient Details  Name: Catherine [name] MRN: [id number] Date of Birth: 1935/09/17 Referring Provider:     CARDIAC REHAB PHASE II ORIENTATION from 12/23/2016 in [place]  Referring Provider  Dr. Oval Linsey      Encounter Date: 02/23/2017  Check In:     Session Check In - 02/23/17 0930      Check-In   Location [facility]   Staff Present Aundra Dubin, RN, BSN;Gregory Luther Parody, BS, EP, Exercise Physiologist   Supervising physician immediately available to respond to emergencies See telemetry face sheet for immediately available MD   Medication changes reported     No   Warm-up and Cool-down Performed as group-led instruction   Resistance Training Performed Yes   VAD Patient? No     Pain Assessment   Currently in Pain? No/denies   Pain Score 0-No pain   Multiple Pain Sites No      Capillary Blood Glucose: No results found for this or any previous visit (from the past 24 hour(s)).    History  Smoking Status  . Former Smoker  . Packs/day: 1.00  . Years: 15.00  . Types: Cigarettes  . Quit date: 09/28/1975  Smokeless Tobacco  . Never Used    Goals Met:  Independence with exercise equipment Exercise tolerated well No report of cardiac concerns or symptoms Strength training completed today  Goals Unmet:  Not Applicable  Comments: Check out 1030.   Dr. [name] Sable is Medical Director for [facility] Cardiac and Pulmonary Rehab.

## 2017-02-23 NOTE — Telephone Encounter (Signed)
Spoke with patient and she felt like she was better today and last night used her inhaler instead of second dose of Lasix. She will continue current dose and call back if any further issues. Did go over weighing herself daily and she does not gain 2 pounds in 24 hours or 5 pounds in 1 week. Advised if she did to take an extra Lasix and 2 K+ 10 meq tablets

## 2017-02-24 ENCOUNTER — Telehealth: Payer: Self-pay | Admitting: Family Medicine

## 2017-02-24 ENCOUNTER — Ambulatory Visit (INDEPENDENT_AMBULATORY_CARE_PROVIDER_SITE_OTHER): Payer: Medicare Other | Admitting: Adult Health

## 2017-02-24 DIAGNOSIS — R040 Epistaxis: Secondary | ICD-10-CM | POA: Diagnosis not present

## 2017-02-24 LAB — POCT INR: INR: 2.6

## 2017-02-24 NOTE — Telephone Encounter (Signed)
Spoke to patient and advised of MD recommendations; patient scheduled for appointment at 1545 with Dorothyann Peng, NP; patient does report 2 unexplained nosebleeds this week, denies any other abnormalities

## 2017-02-24 NOTE — Progress Notes (Signed)
Subjective:    Patient ID: Catherine [name], female    DOB: 29-Dec-1934, 82 y.o.   MRN: [id number]  HPI  82 year old female who  has a past medical history of Arthritis; Asthma; Atrial fibrillation, chronic ([place]); Cellulitis of left lower extremity (08/17/2016); Chronic anticoagulation; Chronic diastolic CHF (congestive heart failure) ([place]); Chronic kidney disease; COPD (chronic obstructive pulmonary disease) ([place]); Coronary artery disease; Dizziness; H/O: rheumatic fever; Heart murmur; Hypertension; PONV (postoperative nausea and vomiting); S/P Maze operation for atrial fibrillation (10/14/2016); S/P mitral valve replacement with bioprosthetic valve (10/14/2016); UTI (urinary tract infection); and Valvular heart disease.   She is a patient of Dr.Burchette who I am seeing today as he is away. She reports that she has had two unprovoked nosebleeds this week. She reports that she feels as though " it is coming from way up in my nose." She has not had any issues with stopping the bleeding. Bleeding only present on right side  Last nose bleed was 24 hours ago.   Denies any headaches or blurred vision. She has not been using any nasal sprays   She would like to have her INR checked    Review of Systems See HPI   Past Medical History:  Diagnosis Date  . Arthritis   . Asthma    last attack 02/2015  . Atrial fibrillation, chronic ([place])   . Cellulitis of left lower extremity 08/17/2016   Ulcer associated with severe venous insufficiency  . Chronic anticoagulation   . Chronic diastolic CHF (congestive heart failure) ([place])   . Chronic kidney disease    "RIGHT MANY KIDNEY INFECTIONS AND STONES"  . COPD (chronic obstructive pulmonary disease) ([place])   . Coronary artery disease   . Dizziness   . H/O: rheumatic fever   . Heart murmur   . Hypertension   . PONV (postoperative nausea and vomiting)    ' SOMETIMES', BUT NOT ALWAYS"  . S/P Maze operation for atrial fibrillation 10/14/2016   Complete  bilateral atrial lesion set using cryothermy and bipolar radiofrequency ablation - atrial appendage was not treated due to previous surgical procedure (open mitral commissurotomy)  . S/P mitral valve replacement with bioprosthetic valve 10/14/2016   29 mm Medtronic Mosaic porcine bioprosthetic tissue valve  . UTI (urinary tract infection)   . Valvular heart disease    Has mitral stenosis with prior mitral commissurotomy in [place] History  . Marital status: Married    Spouse name: N/A  . Number of children: N/A  . Years of education: N/A   Occupational History  . Not on file.   Social History Main Topics  . Smoking status: Former Smoker    Packs/day: 1.00    Years: 15.00    Types: Cigarettes    Quit date: 09/28/1975  . Smokeless tobacco: Never Used  . Alcohol use No  . Drug use: No  . Sexual activity: Yes   Other Topics Concern  . Not on file   Social History Narrative  . No narrative on file    Past Surgical History:  Procedure Laterality Date  . ABDOMINAL HYSTERECTOMY  1983   endometriosis  . APPENDECTOMY    . BACK SURGERY     neurosurgery x2  . CARDIAC CATHETERIZATION    . CARDIAC CATHETERIZATION N/A 08/03/2016   Procedure: Right/Left Heart Cath and Coronary Angiography;  Surgeon: Peter M Martinique, MD;  Location: [place] [facility];  Service: Cardiovascular;  Laterality: N/A;  .  cataract surg    . CHOLECYSTECTOMY N/A 05/30/2015   Procedure: LAPAROSCOPIC CHOLECYSTECTOMY WITH INTRAOPERATIVE CHOLANGIOGRAM;  Surgeon: Excell Seltzer, MD;  Location: [place];  Service: General;  Laterality: N/A;  . COLONOSCOPY    . CORONARY ARTERY BYPASS GRAFT N/A 10/14/2016   Procedure: CORONARY ARTERY BYPASS GRAFTING (CABG);  Surgeon: Rexene Alberts, MD;  Location: [place];  Service: Open Heart Surgery;  Laterality: N/A;  . EYE SURGERY    . MAZE N/A 10/14/2016   Procedure: MAZE;  Surgeon: Rexene Alberts, MD;  Location: [place];  Service: Open Heart Surgery;   Laterality: N/A;  . MITRAL VALVE REPLACEMENT N/A 10/14/2016   Procedure: REDO MITRAL VALVE REPLACEMENT (MVR);  Surgeon: Rexene Alberts, MD;  Location: [place];  Service: Open Heart Surgery;  Laterality: N/A;  . MITRAL VALVE SURGERY Left 1970   Open mitral commissurotomy via left thoracotomy approach  . TEE WITHOUT CARDIOVERSION N/A 07/05/2016   Procedure: TRANSESOPHAGEAL ECHOCARDIOGRAM (TEE);  Surgeon: [name], MD;  Location: [place];  Service: Cardiovascular;  Laterality: N/A;  . TEE WITHOUT CARDIOVERSION N/A 10/14/2016   Procedure: TRANSESOPHAGEAL ECHOCARDIOGRAM (TEE);  Surgeon: Rexene Alberts, MD;  Location: [place];  Service: Open Heart Surgery;  Laterality: N/A;    Family History  Problem Relation Age of Onset  . Leukemia Father     Allergies  Allergen Reactions  . Aldactone [Spironolactone] Other (See Comments)    dyspnea  . Amoxicillin Palpitations    Tachycardia Has patient had a PCN reaction causing immediate rash, facial/tongue/throat swelling, SOB or lightheadedness with hypotension: no Has patient had a PCN reaction causing severe rash involving mucus membranes or skin necrosis: {no Has patient had a PCN reaction that required hospitalization {no Has patient had a PCN reaction occurring within the last 10 years: {yes If all of the above answers are "NO", then may proceed with Cephalosporin use.  . Diltiazem Other (See Comments)    Causing headaches   . Flagyl [Metronidazole Hcl] Other (See Comments)    Causing headaches    . Flovent [Fluticasone Propionate] Other (See Comments)    Leg cramps  . Gabapentin Swelling  . Lyrica [Pregabalin] Swelling  . Quinidine Diarrhea and Other (See Comments)    Fever diarrhea  . Simvastatin Other (See Comments)    Leg pain, myalgia  . Tramadol Nausea Only  . Verapamil Other (See Comments)    myalgias  . Ace Inhibitors Other (See Comments)    unknown  . Benazepril Hcl Cough  . Ciprocin-Fluocin-Procin [Fluocinolone  Acetonide] Other (See Comments)    unknown  . Ciprofloxacin Diarrhea  . Codeine Nausea Only  . Nitrofurantoin Monohyd Macro Nausea Only    Current Outpatient Prescriptions on File Prior to Visit  Medication Sig Dispense Refill  . acetaminophen (TYLENOL) 500 MG tablet Take 500 mg by mouth every 6 (six) hours as needed for mild pain.    Marland Kitchen aspirin EC 81 MG EC tablet Take 1 tablet (81 mg total) by mouth daily.    . B Complex-C (B-COMPLEX WITH VITAMIN C) tablet Take 1 tablet by mouth daily.    . diazepam (VALIUM) 5 MG tablet Take one half tablet each morning and one at night prn anxiety (Patient taking differently: Take 2.5-5 mg by mouth 2 (two) times daily. Take one half tablet each morning if needed for anxiety and 1 tablet at bedtime every night.) 45 tablet 3  . furosemide (LASIX) 40 MG tablet Take 1 tablet by mouth daily. Ok to  take an extra tablet as needed for swelling. 60 tablet 11  . Multiple Vitamin (MULTIVITAMIN) tablet Take 1 tablet by mouth daily.      . nebivolol (BYSTOLIC) 10 MG tablet Take 1 tablet (10 mg total) by mouth daily. 30 tablet 5  . OVER THE COUNTER MEDICATION Apply 1 application topically at bedtime as needed (Leg Pain). Magni-Life topical cream for leg pain.    . potassium chloride (K-DUR,KLOR-CON) 10 MEQ tablet Take 1 tablet (10 mEq total) by mouth 2 (two) times daily. Take an extra 2 tablets on days you take extra Furosemide 90 tablet 11  . rosuvastatin (CRESTOR) 5 MG tablet Take 1 tablet (5 mg total) by mouth daily at 6 PM. 90 tablet 0  . SYMBICORT 80-4.5 MCG/ACT inhaler 2 PUFFS 2 TIMES A DAY 10.2 g 0  . triamterene-hydrochlorothiazide (MAXZIDE-25) 37.5-25 MG tablet Take 1 tablet by mouth daily. 90 tablet 2  . warfarin (COUMADIN) 2 MG tablet Take 1 tablet (2 mg total) by mouth daily at 6 PM. As directed by the coumadin clinic (Patient taking differently: Take 1-2 mg by mouth daily at 6 PM. As directed by the coumadin clinic,  Currently 1 tablet daily except 1/2 tablet on  Monday - Wednesday - Friday.) 100 tablet 1   No current facility-administered medications on file prior to visit.     There were no vitals taken for this visit.      Objective:   Physical Exam  Constitutional: She is oriented to person, place, and time. She appears well-developed and well-nourished. No distress.  HENT:  Nose: Nose normal. No mucosal edema, rhinorrhea or nose lacerations. No epistaxis.  Mouth/Throat: Uvula is midline, oropharynx is clear and moist and mucous membranes are normal.  Neurological: She is alert and oriented to person, place, and time.  Skin: Skin is warm and dry. She is not diaphoretic.  Psychiatric: She has a normal mood and affect. Her behavior is normal. Judgment and thought content normal.  Nursing note and vitals reviewed.     Assessment & Plan:  1. Epistaxis - POC INR- 2. 6 - No scabs or lacerations seen.  - Advised normal saline nasal spray  - Follow up with any other nose bleeds  Dorothyann Peng, NP

## 2017-02-24 NOTE — Telephone Encounter (Signed)
Routed to provider for advice

## 2017-02-24 NOTE — Telephone Encounter (Signed)
The pt called over here looking for Jenny Reichmann., RN, who runs our coumadin clinic, Jenny Reichmann is out all this week and the Pt would like her Coum checked. She was seen by her cardiologists yesterday and he believes she should get it check soon because she has been having nose bleeds, she has an appt here 6/15, but the Pt would like to come in today or tomorrow. What would Burchette like the Pt to do? The only thing we can do here is send her to the lab and get her blood drawn. If that is ok can orders be put in for that? Thanks for your help.

## 2017-02-24 NOTE — Telephone Encounter (Signed)
Last INR on 02/01/17 2.6  Current dose:   warfarin (COUMADIN) 2 MG tablet 100 tablet 1 10/22/2016    Sig - Route: Take 1 tablet (2 mg total) by mouth daily at 6 PM. As directed by the coumadin clinic - Oral   Patient taking differently: Take 1-2 mg by mouth daily at 6 PM. As directed by the coumadin clinic, Currently 1 tablet daily except 1/2 tablet on Monday - Wednesday - Friday.

## 2017-02-24 NOTE — Telephone Encounter (Signed)
Several openings this afternoon- why not have her come in and get POC INR and have her see a provider to evaluate the nosebleeds?

## 2017-02-24 NOTE — Patient Instructions (Signed)
It was great meeting you today   Please stop by the drug store and get some normal saline nasal spray. Catherine [name] - is the name brand one.   Use this multiple times throughout the day   Follow up with any other nose bleeds

## 2017-02-25 ENCOUNTER — Encounter (HOSPITAL_COMMUNITY)
Admission: RE | Admit: 2017-02-25 | Discharge: 2017-02-25 | Disposition: A | Discharge status: Home / Self Care | Payer: Medicare Other | Source: Ambulatory Visit | Attending: Cardiovascular Disease | Admitting: Cardiovascular Disease

## 2017-02-25 DIAGNOSIS — Z9889 Other specified postprocedural states: Secondary | ICD-10-CM | POA: Diagnosis not present

## 2017-02-25 DIAGNOSIS — Z8679 Personal history of other diseases of the circulatory system: Secondary | ICD-10-CM | POA: Insufficient documentation

## 2017-02-25 DIAGNOSIS — Z951 Presence of aortocoronary bypass graft: Secondary | ICD-10-CM | POA: Diagnosis not present

## 2017-02-25 DIAGNOSIS — Z953 Presence of xenogenic heart valve: Secondary | ICD-10-CM | POA: Insufficient documentation

## 2017-02-25 NOTE — Progress Notes (Signed)
Daily Session Note  Patient Details  Name: Catherine [name] MRN: [id number] Date of Birth: Mar 28, 1935 Referring Provider:     CARDIAC REHAB PHASE II ORIENTATION from 12/23/2016 in [place]  Referring Provider  Dr. Oval Linsey      Encounter Date: 02/25/2017  Check In:     Session Check In - 02/25/17 0930      Check-In   Location [facility]   Staff Present Aundra Dubin, RN, BSN;Gregory Luther Parody, BS, EP, Exercise Physiologist   Supervising physician immediately available to respond to emergencies See telemetry face sheet for immediately available MD   Medication changes reported     No   Fall or balance concerns reported    No   Warm-up and Cool-down Performed as group-led instruction   Resistance Training Performed Yes   VAD Patient? No     Pain Assessment   Currently in Pain? No/denies   Pain Score 0-No pain   Multiple Pain Sites No      Capillary Blood Glucose: Results for orders placed or performed in visit on 02/24/17 (from the past 24 hour(s))  POC INR     Status: None   Collection Time: 02/24/17  3:40 PM  Result Value Ref Range   INR 2.6       History  Smoking Status  . Former Smoker  . Packs/day: 1.00  . Years: 15.00  . Types: Cigarettes  . Quit date: 09/28/1975  Smokeless Tobacco  . Never Used    Goals Met:  Independence with exercise equipment Exercise tolerated well No report of cardiac concerns or symptoms Strength training completed today  Goals Unmet:  Not Applicable  Comments: Check out 1030.   Dr. [name] Sable is Medical Director for [facility] Cardiac and Pulmonary Rehab.

## 2017-02-28 ENCOUNTER — Encounter (HOSPITAL_COMMUNITY)
Admission: RE | Admit: 2017-02-28 | Discharge: 2017-02-28 | Disposition: A | Discharge status: Home / Self Care | Payer: Medicare Other | Source: Ambulatory Visit | Attending: Cardiovascular Disease | Admitting: Cardiovascular Disease

## 2017-02-28 DIAGNOSIS — Z953 Presence of xenogenic heart valve: Secondary | ICD-10-CM

## 2017-02-28 DIAGNOSIS — Z9889 Other specified postprocedural states: Secondary | ICD-10-CM | POA: Diagnosis not present

## 2017-02-28 DIAGNOSIS — Z8679 Personal history of other diseases of the circulatory system: Secondary | ICD-10-CM | POA: Diagnosis not present

## 2017-02-28 DIAGNOSIS — Z951 Presence of aortocoronary bypass graft: Secondary | ICD-10-CM | POA: Diagnosis not present

## 2017-02-28 NOTE — Addendum Note (Signed)
Addendum  created 02/28/17 1012 by Oleta Mouse, MD   Sign clinical note

## 2017-02-28 NOTE — Progress Notes (Signed)
Daily Session Note  Patient Details  Name: Catherine [name] MRN: [id number] Date of Birth: 1934/10/19 Referring Provider:     CARDIAC REHAB PHASE II ORIENTATION from 12/23/2016 in [place]  Referring Provider  Dr. Oval Linsey      Encounter Date: 02/28/2017  Check In:     Session Check In - 02/28/17 1001      Check-In   Location [facility]   Staff Present Aundra Dubin, RN, BSN;Makaylen Thieme Luther Parody, BS, EP, Exercise Physiologist   Supervising physician immediately available to respond to emergencies See telemetry face sheet for immediately available MD   Medication changes reported     No   Fall or balance concerns reported    No   Warm-up and Cool-down Performed as group-led instruction   Resistance Training Performed Yes   VAD Patient? No     Pain Assessment   Currently in Pain? No/denies   Pain Score 0-No pain   Multiple Pain Sites No      Capillary Blood Glucose: No results found for this or any previous visit (from the past 24 hour(s)).    History  Smoking Status  . Former Smoker  . Packs/day: 1.00  . Years: 15.00  . Types: Cigarettes  . Quit date: 09/28/1975  Smokeless Tobacco  . Never Used    Goals Met:  Independence with exercise equipment Exercise tolerated well No report of cardiac concerns or symptoms Strength training completed today  Goals Unmet:  Not Applicable  Comments: Check out 1030   Dr. [name] Sable is Medical Director for [place] and Pulmonary Rehab.

## 2017-03-02 ENCOUNTER — Encounter (HOSPITAL_COMMUNITY)
Admission: RE | Admit: 2017-03-02 | Discharge: 2017-03-02 | Disposition: A | Discharge status: Home / Self Care | Payer: Medicare Other | Source: Ambulatory Visit | Attending: Cardiovascular Disease | Admitting: Cardiovascular Disease

## 2017-03-02 DIAGNOSIS — Z953 Presence of xenogenic heart valve: Secondary | ICD-10-CM | POA: Diagnosis not present

## 2017-03-02 DIAGNOSIS — Z951 Presence of aortocoronary bypass graft: Secondary | ICD-10-CM | POA: Diagnosis not present

## 2017-03-02 DIAGNOSIS — Z8679 Personal history of other diseases of the circulatory system: Secondary | ICD-10-CM | POA: Diagnosis not present

## 2017-03-02 DIAGNOSIS — Z9889 Other specified postprocedural states: Secondary | ICD-10-CM | POA: Diagnosis not present

## 2017-03-02 NOTE — Progress Notes (Signed)
Daily Session Note  Patient Details  Name: Catherine [name] MRN: [id number] Date of Birth: 14-Nov-1934 Referring Provider:     [place] from 12/23/2016 in [place]  Referring Provider  Dr. Oval Linsey      Encounter Date: 03/02/2017  Check In:     Session Check In - 03/02/17 0930      Check-In   Location [facility]   Staff Present Aundra Dubin, RN, BSN;Gregory Luther Parody, BS, EP, Exercise Physiologist   Supervising physician immediately available to respond to emergencies See telemetry face sheet for immediately available MD   Medication changes reported     No   Fall or balance concerns reported    No   Tobacco Cessation No Change   Warm-up and Cool-down Performed as group-led instruction   Resistance Training Performed Yes   VAD Patient? No     Pain Assessment   Currently in Pain? No/denies   Pain Score 0-No pain   Multiple Pain Sites No      Capillary Blood Glucose: No results found for this or any previous visit (from the past 24 hour(s)).    History  Smoking Status  . Former Smoker  . Packs/day: 1.00  . Years: 15.00  . Types: Cigarettes  . Quit date: 09/28/1975  Smokeless Tobacco  . Never Used    Goals Met:  Independence with exercise equipment Exercise tolerated well No report of cardiac concerns or symptoms Strength training completed today  Goals Unmet:  Not Applicable  Comments: Check out 1030.   Dr. [name] Sable is Medical Director for [facility] Cardiac and Pulmonary Rehab.

## 2017-03-04 ENCOUNTER — Encounter (HOSPITAL_COMMUNITY)
Admission: RE | Admit: 2017-03-04 | Discharge: 2017-03-04 | Disposition: A | Discharge status: Home / Self Care | Payer: Medicare Other | Source: Ambulatory Visit | Attending: Cardiovascular Disease | Admitting: Cardiovascular Disease

## 2017-03-04 DIAGNOSIS — Z9889 Other specified postprocedural states: Secondary | ICD-10-CM | POA: Diagnosis not present

## 2017-03-04 DIAGNOSIS — Z951 Presence of aortocoronary bypass graft: Secondary | ICD-10-CM | POA: Diagnosis not present

## 2017-03-04 DIAGNOSIS — Z953 Presence of xenogenic heart valve: Secondary | ICD-10-CM

## 2017-03-04 DIAGNOSIS — Z8679 Personal history of other diseases of the circulatory system: Secondary | ICD-10-CM | POA: Diagnosis not present

## 2017-03-04 NOTE — Progress Notes (Signed)
Daily Session Note  Patient Details  Name: Catherine [name] MRN: [id number] Date of Birth: [birth year] Referring Provider:     CARDIAC REHAB PHASE II ORIENTATION from 12/23/2016 in [place]  Referring Provider  Dr. Oval Linsey      Encounter Date: 03/04/2017  Check In:     Session Check In - 03/04/17 0930      Check-In   Location [facility]   Staff Present Diane Angelina Pih, MS, EP, [facility], Exercise Physiologist;Akhil Piscopo Wynetta Emery, RN, BSN;Gregory Cowan, BS, EP, Exercise Physiologist   Supervising physician immediately available to respond to emergencies See telemetry face sheet for immediately available MD   Medication changes reported     No   Fall or balance concerns reported    No   Tobacco Cessation No Change   Warm-up and Cool-down Performed as group-led instruction   Resistance Training Performed Yes   VAD Patient? No     Pain Assessment   Currently in Pain? No/denies   Pain Score 0-No pain   Multiple Pain Sites No      Capillary Blood Glucose: No results found for this or any previous visit (from the past 24 hour(s)).    History  Smoking Status  . Former Smoker  . Packs/day: 1.00  . Years: 15.00  . Types: Cigarettes  . Quit date: 09/28/1975  Smokeless Tobacco  . Never Used    Goals Met:  Independence with exercise equipment Exercise tolerated well No report of cardiac concerns or symptoms Strength training completed today  Goals Unmet:  Not Applicable  Comments: Check out 1030.   Dr. [name] Sable is Medical Director for [facility] Cardiac and Pulmonary Rehab.

## 2017-03-07 ENCOUNTER — Encounter (HOSPITAL_COMMUNITY)
Admission: RE | Admit: 2017-03-07 | Discharge: 2017-03-07 | Disposition: A | Discharge status: Home / Self Care | Payer: Medicare Other | Source: Ambulatory Visit | Attending: Cardiovascular Disease | Admitting: Cardiovascular Disease

## 2017-03-07 DIAGNOSIS — Z953 Presence of xenogenic heart valve: Secondary | ICD-10-CM | POA: Diagnosis not present

## 2017-03-07 DIAGNOSIS — Z9889 Other specified postprocedural states: Secondary | ICD-10-CM | POA: Diagnosis not present

## 2017-03-07 DIAGNOSIS — Z951 Presence of aortocoronary bypass graft: Secondary | ICD-10-CM

## 2017-03-07 DIAGNOSIS — Z8679 Personal history of other diseases of the circulatory system: Secondary | ICD-10-CM | POA: Diagnosis not present

## 2017-03-07 NOTE — Progress Notes (Signed)
Daily Session Note  Patient Details  Name: ZAIDE MCCLENAHAN MRN: [id number] Date of Birth: Sep 27, 1935 Referring Provider:     [facility] from 12/23/2016 in [place]  Referring Provider  Dr. Oval Linsey      Encounter Date: 03/07/2017  Check In:     Session Check In - 03/07/17 0930      Check-In   Location [facility]   Staff Present Diane Angelina Pih, MS, EP, [facility], Exercise Physiologist;Hammond Obeirne Wynetta Emery, RN, BSN;Gregory Cowan, BS, EP, Exercise Physiologist   Supervising physician immediately available to respond to emergencies See telemetry face sheet for immediately available MD   Medication changes reported     No   Fall or balance concerns reported    No   Tobacco Cessation No Change   Warm-up and Cool-down Performed as group-led instruction   Resistance Training Performed Yes   VAD Patient? No     Pain Assessment   Currently in Pain? No/denies   Pain Score 0-No pain   Multiple Pain Sites No      Capillary Blood Glucose: No results found for this or any previous visit (from the past 24 hour(s)).    History  Smoking Status  . Former Smoker  . Packs/day: 1.00  . Years: 15.00  . Types: Cigarettes  . Quit date: 09/28/1975  Smokeless Tobacco  . Never Used    Goals Met:  Independence with exercise equipment Exercise tolerated well No report of cardiac concerns or symptoms Strength training completed today  Goals Unmet:  Not Applicable  Comments: Check out 1030.   Dr. [name] Sable is Medical Director for [facility] Cardiac and Pulmonary Rehab.

## 2017-03-08 ENCOUNTER — Ambulatory Visit (INDEPENDENT_AMBULATORY_CARE_PROVIDER_SITE_OTHER): Payer: Medicare Other | Admitting: General Practice

## 2017-03-08 ENCOUNTER — Ambulatory Visit (INDEPENDENT_AMBULATORY_CARE_PROVIDER_SITE_OTHER): Payer: Medicare Other | Admitting: Cardiovascular Disease

## 2017-03-08 ENCOUNTER — Encounter: Payer: Self-pay | Admitting: Cardiovascular Disease

## 2017-03-08 VITALS — BP 136/56 | HR 51 | Ht 63.0 in | Wt 152.0 lb

## 2017-03-08 DIAGNOSIS — Z953 Presence of xenogenic heart valve: Secondary | ICD-10-CM

## 2017-03-08 DIAGNOSIS — I480 Paroxysmal atrial fibrillation: Secondary | ICD-10-CM | POA: Diagnosis not present

## 2017-03-08 DIAGNOSIS — R5383 Other fatigue: Secondary | ICD-10-CM

## 2017-03-08 DIAGNOSIS — E7800 Pure hypercholesterolemia, unspecified: Secondary | ICD-10-CM | POA: Diagnosis not present

## 2017-03-08 DIAGNOSIS — I10 Essential (primary) hypertension: Secondary | ICD-10-CM

## 2017-03-08 DIAGNOSIS — R001 Bradycardia, unspecified: Secondary | ICD-10-CM | POA: Diagnosis not present

## 2017-03-08 DIAGNOSIS — I4891 Unspecified atrial fibrillation: Secondary | ICD-10-CM | POA: Diagnosis not present

## 2017-03-08 LAB — POCT INR: INR: 2

## 2017-03-08 MED ORDER — AMLODIPINE BESYLATE 5 MG PO TABS: 5.0000 mg | ORAL_TABLET | Freq: Every day | ORAL | 5 refills | Status: DC

## 2017-03-08 MED ORDER — POTASSIUM CHLORIDE CRYS ER 10 MEQ PO TBCR: 10.0000 meq | EXTENDED_RELEASE_TABLET | Freq: Two times a day (BID) | ORAL | 11 refills | Status: DC

## 2017-03-08 NOTE — Progress Notes (Signed)
Cardiology Office Note   Date:  03/08/2017   ID:  Catherine [name], DOB [birth year], MRN [id number]  PCP:  [name], MD  Cardiologist:   [name], MD  Cardiothoracic surgeon: Dr. Roxy Manns  Chief Complaint  [name] presents with  . Follow-up    1 month;     History of Present Illness: Catherine [name] is a 82 y.o. 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 Catherine [name] was previously a [name] of [name]. She underwent mitral commissurotomy in Catherine 1970s. On her echocardiogram 02/2014 she had an ejection fraction of 60-65% with moderate to severe mitral regurgitation. There was also mild aortic regurgitation. Her follow-up echo 02/2015 showed an EF of 60-65% with moderate to severe mitral regurgitation, moderate tricuspid regurgitation and elevated pulmonary artery pressures.  Repeat echo 9/11/7 revealed moderate pulmonary stenosis with moderate to severe mitral regurgitation.  She did not have any pulmonary hypertension and her LV was not dilated.  She was referred for left and right heart catheterization. She was found to have severe mitral stenosis with a 13 mmHg mean gradient, MVA 0.99 cm^2. She also had 50% LCx and 80% RCA lesions.  She had a 29 mm bioprosthetic mitral valve, single vessel CABG and MAZE with Dr. Roxy Manns on 10/14/16.    At her last appoitnment Catherine [name] BP was elevated.  Atenolol was switched to nebivolol for blood pressure control as well as her fatigue.  She initially felt better but now has She  persistent fatigue. She continues to participate in cardiac rehabilitation. She brings a printout of her heart rate and blood pressure readings from rehabilitation where her heart rate has been mostly in Catherine 40s to 50s prior to exercise and increases to a max of 93. However most of her high heart rate readings are in  Catherine 70s to 80s. Her blood pressure has been somewhat labile and ranging from 120-150 prior to exercise. She was referred for a baseline echo 02/15/17 that revealed LVEF 60-65% with mild aortic stenosis (mean gradient 12 mmHg).  Her bioprosthetic mitral valve was functioning well.  PASP  41 mmHgshe is very frustrated that she continues to gain weight despite increasing her exercise. She is so tired that after cardiac rehabilitation all she can do is go home and lay down. She sometimes for 3 or 4 hours.    Past Medical History:  Diagnosis Date  . Arthritis   . Asthma    last attack 02/2015  . Atrial fibrillation, chronic ([place])   . Cellulitis of left lower extremity 08/17/2016   Ulcer associated with severe venous insufficiency  . Chronic anticoagulation   . Chronic diastolic CHF (congestive heart failure) ([place])   . Chronic kidney disease    "RIGHT MANY KIDNEY INFECTIONS AND STONES"  . COPD (chronic obstructive pulmonary disease) ([place])   . Coronary artery disease   . Dizziness   . H/O: rheumatic fever   . Heart murmur   . Hypertension   . PONV (postoperative nausea and vomiting)    ' SOMETIMES', BUT NOT ALWAYS"  . S/P Maze operation for atrial fibrillation 10/14/2016   Complete bilateral atrial lesion set using cryothermy and bipolar radiofrequency ablation - atrial appendage was not treated due to previous surgical procedure (open mitral commissurotomy)  . S/P mitral valve replacement with bioprosthetic valve 10/14/2016   29 mm Medtronic Mosaic porcine  bioprosthetic tissue valve  . UTI (urinary tract infection)   . Valvular heart disease    Has mitral stenosis with prior mitral commissurotomy in 1970    Past Surgical History:  Procedure Laterality Date  . ABDOMINAL HYSTERECTOMY  1983   endometriosis  . APPENDECTOMY    . BACK SURGERY     neurosurgery x2  . CARDIAC CATHETERIZATION    . CARDIAC CATHETERIZATION N/A 08/03/2016   Procedure: Right/Left Heart Cath and Coronary Angiography;   Surgeon: Peter M Martinique, MD;  Location: [facility];  Service: Cardiovascular;  Laterality: N/A;  . cataract surg    . CHOLECYSTECTOMY N/A 05/30/2015   Procedure: LAPAROSCOPIC CHOLECYSTECTOMY WITH INTRAOPERATIVE CHOLANGIOGRAM;  Surgeon: Excell Seltzer, MD;  Location: [place];  Service: General;  Laterality: N/A;  . COLONOSCOPY    . CORONARY ARTERY BYPASS GRAFT N/A 10/14/2016   Procedure: CORONARY ARTERY BYPASS GRAFTING (CABG);  Surgeon: Rexene Alberts, MD;  Location: [place] [facility];  Service: Open Heart Surgery;  Laterality: N/A;  . EYE SURGERY    . MAZE N/A 10/14/2016   Procedure: MAZE;  Surgeon: Rexene Alberts, MD;  Location: [place];  Service: Open Heart Surgery;  Laterality: N/A;  . MITRAL VALVE REPLACEMENT N/A 10/14/2016   Procedure: REDO MITRAL VALVE REPLACEMENT (MVR);  Surgeon: Rexene Alberts, MD;  Location: [place];  Service: Open Heart Surgery;  Laterality: N/A;  . MITRAL VALVE SURGERY Left 1970   Open mitral commissurotomy via left thoracotomy approach  . TEE WITHOUT CARDIOVERSION N/A 07/05/2016   Procedure: TRANSESOPHAGEAL ECHOCARDIOGRAM (TEE);  Surgeon: [name], MD;  Location: [facility];  Service: Cardiovascular;  Laterality: N/A;  . TEE WITHOUT CARDIOVERSION N/A 10/14/2016   Procedure: TRANSESOPHAGEAL ECHOCARDIOGRAM (TEE);  Surgeon: Rexene Alberts, MD;  Location: [place];  Service: Open Heart Surgery;  Laterality: N/A;    Current Outpatient Prescriptions  Medication Sig Dispense Refill  . acetaminophen (TYLENOL) 500 MG tablet Take 500 mg by mouth every 6 (six) hours as needed for mild pain.    Marland Kitchen aspirin EC 81 MG EC tablet Take 1 tablet (81 mg total) by mouth daily.    . B Complex-C (B-COMPLEX WITH VITAMIN C) tablet Take 1 tablet by mouth daily.    . diazepam (VALIUM) 5 MG tablet Take one half tablet each morning and one at night prn anxiety ([name] taking differently: Take 2.5-5 mg by mouth 2 (two) times daily. Take one half tablet each morning if needed for anxiety and 1  tablet at bedtime every night.) 45 tablet 3  . furosemide (LASIX) 40 MG tablet Take 1 tablet by mouth daily. Ok to take an extra tablet as needed for swelling. 60 tablet 11  . Multiple Vitamin (MULTIVITAMIN) tablet Take 1 tablet by mouth daily.      Marland Kitchen OVER Catherine COUNTER MEDICATION Apply 1 application topically at bedtime as needed (Leg Pain). Magni-Life topical cream for leg pain.    . potassium chloride (K-DUR,KLOR-CON) 10 MEQ tablet Take 1 tablet (10 mEq total) by mouth 2 (two) times daily. Take an extra 2 tablets on days you take extra Furosemide 120 tablet 11  . rosuvastatin (CRESTOR) 5 MG tablet Take 1 tablet (5 mg total) by mouth daily at 6 PM. 90 tablet 0  . SYMBICORT 80-4.5 MCG/ACT inhaler 2 PUFFS 2 TIMES A DAY 10.2 g 0  . triamterene-hydrochlorothiazide (MAXZIDE-25) 37.5-25 MG tablet Take 1 tablet by mouth daily. 90 tablet 2  . warfarin (COUMADIN) 2 MG tablet Take 1 tablet (  2 mg total) by mouth daily at 6 PM. As directed by Catherine coumadin clinic ([name] taking differently: Take 1-2 mg by mouth daily at 6 PM. As directed by Catherine coumadin clinic,  Currently 1 tablet daily except 1/2 tablet on Monday - Wednesday - Friday.) 100 tablet 1  . amLODipine (NORVASC) 5 MG tablet Take 1 tablet (5 mg total) by mouth daily. 30 tablet 5   No current facility-administered medications for this visit.     Allergies:   Aldactone [spironolactone]; Amoxicillin; Diltiazem; Flagyl [metronidazole hcl]; Flovent [fluticasone propionate]; Gabapentin; Lyrica [pregabalin]; Quinidine; Simvastatin; Tramadol; Verapamil; Ace inhibitors; Benazepril hcl; Ciprocin-fluocin-procin [fluocinolone acetonide]; Ciprofloxacin; Codeine; and Nitrofurantoin monohyd macro    Social History:  Catherine [name]  reports that she quit smoking about 41 years ago. Her smoking use included Cigarettes. She has a 15.00 pack-year smoking history. She has never used smokeless tobacco. She reports that she does not drink alcohol or use drugs.   Family  History:  Catherine [name]'s family history includes Leukemia in her father.    ROS:  Please see Catherine history of present illness.   Otherwise, review of systems are positive for chest wall soreness.   All other systems are reviewed and negative.    PHYSICAL EXAM: VS:  BP (!) 136/56   Pulse (!) 51   Ht 5\' 3"  (1.6 m)   Wt 68.9 kg (152 lb)   BMI 26.93 kg/m  , BMI Body mass index is 26.93 kg/m. GENERAL:  Well appearing.  No acute distress HEENT:  Pupils equal round and reactive, fundi not visualized, oral mucosa unremarkable NECK:  No jugular venous distention, waveform within normal limits, carotid upstroke brisk and symmetric, no bruits LUNGS:  Clear to auscultation bilaterally.  No crackles, rhonchi or wheezes HEART:  Irregularly irregular.  PMI not displaced or sustained,S1 and S2 within normal limits, no S3, no S4, no clicks, no rubs, II/VI systolic murmur throughout Chest: Healing midline incision.  Serous drainage from midline incision.  No erythema ABD:  Flat, positive bowel sounds normal in frequency in pitch, no bruits, no rebound, no guarding, no midline pulsatile mass, no hepatomegaly, no splenomegaly EXT:  2 plus pulses throughout, 2+ pitting edema to above Catherine ankles bilaterally, no cyanosis no clubbing SKIN:  No rashes no nodules NEURO:  Cranial nerves II through XII grossly intact, motor grossly intact throughout PSYCH:  Cognitively intact, oriented to person place and time   EKG:  EKG is ordered today. Catherine ekg ordered 05/21/16 demonstrates atrial fibrillateion.  Prior septal infarct.  Rate 60 bpm.  Unchanged from 10/22/15. 10/27/16: Sinus rhythm.  Rate 60 bpm.  First degree heart block. 02/01/17: Sinus bradycardia with sinus arrhythmia and first degree block.   03/08/17: Sinus bradycardia with sinus arrhythmia.   First degree heart block.  Junctional escape.  QTc 497 ms.    Echo 02/15/17: Study Conclusions  - Left ventricle: Catherine cavity size was normal. There was mild    concentric hypertrophy. Systolic function was normal. Catherine   estimated ejection fraction was in Catherine range of 60% to 65%. Wall   motion was normal; there were no regional wall motion   abnormalities. - Aortic valve: There was very mild stenosis. There was no   regurgitation. Peak velocity (S): 232 cm/s. Mean gradient (S): 12   mm Hg. - Mitral valve: A 7mm Medtronic Mosaic bioprosthesis was present   and functioning properly. There was trivial regurgitation. - Left atrium: Catherine atrium was severely dilated. - Right ventricle: Catherine  cavity size was mildly dilated. Wall   thickness was normal. Systolic function was normal. - Tricuspid valve: There was moderate regurgitation. - Pulmonary arteries: Systolic pressure was mildly increased. PA   peak pressure: 41 mm Hg (S).  Echo 06/07/16:  Study Conclusions  - Left ventricle: Catherine cavity size was normal. Wall thickness was   normal. Systolic function was vigorous. Catherine estimated ejection   fraction was in Catherine range of 65% to 70%. - Mitral valve: MV is thickened. with some restricted motion Peak   and mean gradients through Catherine valve are 22 and 8 mm Hg   respectively MVA by Pt1/2 is 1.9 cm2. Calcified annulus. Mildly   thickened leaflets . There was moderate to severe regurgitation. - Left atrium: Catherine atrium was massively dilated. - Right atrium: Catherine atrium was mildly dilated.   LHC/RHC 08/03/16: Catherine left ventricular systolic function is normal.  LV end diastolic pressure is normal.  Catherine left ventricular ejection fraction is 55-65% by visual estimate.  There is no aortic valve stenosis.  There is severe mitral valve stenosis.  Prox Cx to Mid Cx lesion, 50 %stenosed.  Mid RCA-2 lesion, 80 %stenosed.  Mid RCA-1 lesion, 80 %stenosed.  Hemodynamic findings consistent with mild pulmonary hypertension.  LV end diastolic pressure is normal.   1. Coronary artery disease   - 50% mid LCx   - 80% sequential lesions in Catherine mid RCA 2. Normal  LV function 3. Severe mitral stenosis. MV gradient of 13 mm Hg. MVA 0.99 cm squared with index 0.57. 4. Moderate to severe mitral insufficiency 5. Mild pulmonary HTN. 6. Normal LV EDP 7. No significant AV gradient 8. Occluded right brachial artery.  Carotid Dopplers 10/12/16: 1-39% bilateral ICA stenosis  Recent Labs: 10/17/2016: Magnesium 2.0 12/20/2016: ALT 17 02/01/2017: BUN 15; Creatinine, Ser 0.90; Hemoglobin 12.9; Platelets 230.0; Potassium 3.5; Sodium 143; TSH 4.09    Lipid Panel    Component Value Date/Time   CHOL 158 12/20/2016 1112   TRIG 184.0 (H) 12/20/2016 1112   HDL 60.00 12/20/2016 1112   CHOLHDL 3 12/20/2016 1112   VLDL 36.8 12/20/2016 1112   LDLCALC 61 12/20/2016 1112   LDLDIRECT 134.7 08/28/2014 1009      Wt Readings from Last 3 Encounters:  03/08/17 68.9 kg (152 lb)  02/01/17 68.9 kg (152 lb)  01/17/17 66.2 kg (146 lb)      ASSESSMENT AND PLAN:  # Rheumatic mitral valve disease: # s/p bioprosthetic mitral valve: No evidence of heart failure on exam.  Echo looks great.  Continue lasix.  # Paroxysmal atrial fibrillation:  # Fatigue: # Bradycardia: Catherine [name] is in sinus/junctional bradycardia.  Her heart rate is 50 bpm and has been very low when at cardiac rehab.  We will stop nebivolol.  Continue warfarin.   # Hypertension:  Blood pressure is mildly elevated today.  Has been well-controlled here.  Continue Maxzide and start amlodipine 5 mg daily.  Continue lasix.  # Hyperlipidemia: Continue rosuvastatin.     Current medicines are reviewed at length with Catherine [name] today.  Catherine [name] does not have concerns regarding medicines.  Catherine following changes have been made:  Stop nebivolol.  Start amlodipine.   Labs/ tests ordered today include:   No orders of Catherine defined types were placed in this encounter.   Disposition:   FU with Joel Cowin C. Oval Linsey, MD, [facility] in 1 month.   Time spent: 45 minutes-Greater than 50% of this time was spent in  counseling, explanation of  diagnosis, planning of further management, and coordination of care.   This note was written with Catherine assistance of speech recognition software.  Please excuse any transcriptional errors.  Signed, Nikeria Kalman C. Oval Linsey, MD, [facility]  03/08/2017 4:53 PM    [facility]

## 2017-03-08 NOTE — Patient Instructions (Signed)
Medication Instructions:  STOP BYSTOLIC   START AMLODIPINE 5 MG DAILY   Labwork: NONE  Testing/Procedures: NONE  Follow-Up: Your physician recommends that you schedule a follow-up appointment in: 4-6 WEEKS   If you need a refill on your cardiac medications before your next appointment, please call your pharmacy.

## 2017-03-08 NOTE — Addendum Note (Signed)
Addended by: Alvina Filbert B on: 03/08/2017 05:40 PM   Modules accepted: Orders

## 2017-03-08 NOTE — Patient Instructions (Signed)
Pre visit review using our clinic review tool, if applicable. No additional management support is needed unless otherwise documented below in the visit note. 

## 2017-03-08 NOTE — Progress Notes (Signed)
I have reviewed and agree with the plan. 

## 2017-03-09 ENCOUNTER — Encounter (HOSPITAL_COMMUNITY)
Admission: RE | Admit: 2017-03-09 | Discharge: 2017-03-09 | Disposition: A | Discharge status: Home / Self Care | Payer: Medicare Other | Source: Ambulatory Visit | Attending: Cardiovascular Disease | Admitting: Cardiovascular Disease

## 2017-03-09 DIAGNOSIS — Z951 Presence of aortocoronary bypass graft: Secondary | ICD-10-CM | POA: Diagnosis not present

## 2017-03-09 DIAGNOSIS — Z8679 Personal history of other diseases of the circulatory system: Secondary | ICD-10-CM | POA: Diagnosis not present

## 2017-03-09 DIAGNOSIS — Z9889 Other specified postprocedural states: Secondary | ICD-10-CM | POA: Diagnosis not present

## 2017-03-09 DIAGNOSIS — Z953 Presence of xenogenic heart valve: Secondary | ICD-10-CM | POA: Diagnosis not present

## 2017-03-09 NOTE — Progress Notes (Signed)
Daily Session Note  Patient Details  Name: LILLER YOHN MRN: [id number] Date of Birth: [birth year] Referring Provider:     CARDIAC REHAB PHASE II ORIENTATION from 12/23/2016 in [place]  Referring Provider  Dr. Oval Linsey      Encounter Date: 03/09/2017  Check In:     Session Check In - 03/09/17 0930      Check-In   Location [facility]   Staff Present Diane Angelina Pih, MS, EP, [facility], Exercise Physiologist;Weda Baumgarner Wynetta Emery, RN, BSN;Gregory Cowan, BS, EP, Exercise Physiologist   Supervising physician immediately available to respond to emergencies See telemetry face sheet for immediately available MD   Medication changes reported     Yes   Comments Patient's cardiologist added Amlodipine 5 mg daily for bradycardia per patient 03/08/17.    Fall or balance concerns reported    No   Tobacco Cessation No Change   Warm-up and Cool-down Performed as group-led instruction   Resistance Training Performed Yes   VAD Patient? No     Pain Assessment   Currently in Pain? No/denies   Pain Score 0-No pain   Multiple Pain Sites No      Capillary Blood Glucose: No results found for this or any previous visit (from the past 24 hour(s)).    History  Smoking Status  . Former Smoker  . Packs/day: 1.00  . Years: 15.00  . Types: Cigarettes  . Quit date: 09/28/1975  Smokeless Tobacco  . Never Used    Goals Met:  Independence with exercise equipment Exercise tolerated well No report of cardiac concerns or symptoms Strength training completed today  Goals Unmet:  Not Applicable  Comments: Check out 1030.   Dr. [name] Sable is Medical Director for [facility] Cardiac and Pulmonary Rehab.

## 2017-03-10 NOTE — Progress Notes (Signed)
Cardiac Individual Treatment Plan  Patient Details  Name: Catherine [name] MRN: [id number] Date of Birth: [birth year] Referring Provider:     CARDIAC REHAB PHASE II ORIENTATION from 12/23/2016 in [place]  Referring Provider  Dr. Oval Linsey      Initial Encounter Date:    CARDIAC REHAB PHASE II ORIENTATION from 12/23/2016 in [place]  Date  12/23/16  Referring Provider  Dr. Oval Linsey      Visit Diagnosis: Status post mitral valve replacement with bioprosthetic valve  S/P CABG x 1  Patient's Home Medications on Admission:  Current Outpatient Prescriptions:  .  acetaminophen (TYLENOL) 500 MG tablet, Take 500 mg by mouth every 6 (six) hours as needed for mild pain., Disp: , Rfl:  .  amLODipine (NORVASC) 5 MG tablet, Take 1 tablet (5 mg total) by mouth daily., Disp: 30 tablet, Rfl: 5 .  aspirin EC 81 MG EC tablet, Take 1 tablet (81 mg total) by mouth daily., Disp: , Rfl:  .  B Complex-C (B-COMPLEX WITH VITAMIN C) tablet, Take 1 tablet by mouth daily., Disp: , Rfl:  .  diazepam (VALIUM) 5 MG tablet, Take one half tablet each morning and one at night prn anxiety (Patient taking differently: Take 2.5-5 mg by mouth 2 (two) times daily. Take one half tablet each morning if needed for anxiety and 1 tablet at bedtime every night.), Disp: 45 tablet, Rfl: 3 .  furosemide (LASIX) 40 MG tablet, Take 1 tablet by mouth daily. Ok to take an extra tablet as needed for swelling., Disp: 60 tablet, Rfl: 11 .  Multiple Vitamin (MULTIVITAMIN) tablet, Take 1 tablet by mouth daily.  , Disp: , Rfl:  .  OVER THE COUNTER MEDICATION, Apply 1 application topically at bedtime as needed (Leg Pain). Magni-Life topical cream for leg pain., Disp: , Rfl:  .  potassium chloride (K-DUR,KLOR-CON) 10 MEQ tablet, Take 1 tablet (10 mEq total) by mouth 2 (two) times daily. Take an extra 2 tablets on days you take extra Furosemide, Disp: 120 tablet, Rfl: 11 .  rosuvastatin (CRESTOR) 5  MG tablet, Take 1 tablet (5 mg total) by mouth daily at 6 PM., Disp: 90 tablet, Rfl: 0 .  SYMBICORT 80-4.5 MCG/ACT inhaler, 2 PUFFS 2 TIMES A DAY, Disp: 10.2 g, Rfl: 0 .  triamterene-hydrochlorothiazide (MAXZIDE-25) 37.5-25 MG tablet, Take 1 tablet by mouth daily., Disp: 90 tablet, Rfl: 2 .  warfarin (COUMADIN) 2 MG tablet, Take 1 tablet (2 mg total) by mouth daily at 6 PM. As directed by the coumadin clinic (Patient taking differently: Take 1-2 mg by mouth daily at 6 PM. As directed by the coumadin clinic,  Currently 1 tablet daily except 1/2 tablet on Monday - Wednesday - Friday.), Disp: 100 tablet, Rfl: 1  Past Medical History: Past Medical History:  Diagnosis Date  . Arthritis   . Asthma    last attack 02/2015  . Atrial fibrillation, chronic ([place])   . Cellulitis of left lower extremity 08/17/2016   Ulcer associated with severe venous insufficiency  . Chronic anticoagulation   . Chronic diastolic CHF (congestive heart failure) ([place])   . Chronic kidney disease    "RIGHT MANY KIDNEY INFECTIONS AND STONES"  . COPD (chronic obstructive pulmonary disease) ([place])   . Coronary artery disease   . Dizziness   . H/O: rheumatic fever   . Heart murmur   . Hypertension   . PONV (postoperative nausea and vomiting)    ' SOMETIMES', BUT NOT ALWAYS"  .  S/P Maze operation for atrial fibrillation 10/14/2016   Complete bilateral atrial lesion set using cryothermy and bipolar radiofrequency ablation - atrial appendage was not treated due to previous surgical procedure (open mitral commissurotomy)  . S/P mitral valve replacement with bioprosthetic valve 10/14/2016   29 mm Medtronic Mosaic porcine bioprosthetic tissue valve  . UTI (urinary tract infection)   . Valvular heart disease    Has mitral stenosis with prior mitral commissurotomy in 1970    Tobacco Use: History  Smoking Status  . Former Smoker  . Packs/day: 1.00  . Years: 15.00  . Types: Cigarettes  . Quit date: 09/28/1975  Smokeless Tobacco   . Never Used    Labs: Recent Review Flowsheet Data    Labs for ITP Cardiac and Pulmonary Rehab Latest Ref Rng & Units 10/15/2016 10/15/2016 10/15/2016 10/15/2016 12/20/2016   Cholestrol 0 - 200 mg/dL - - - - 158   LDLCALC 0 - 99 mg/dL - - - - 61   LDLDIRECT mg/dL - - - - -   HDL >39.00 mg/dL - - - - 60.00   Trlycerides 0.0 - 149.0 mg/dL - - - - 184.0(H)   Hemoglobin A1c 4.8 - 5.6 % - - - - -   PHART 7.350 - 7.450 7.344(L) 7.347(L) 7.330(L) - -   PCO2ART 32.0 - 48.0 mmHg 48.2(H) 44.8 48.7(H) - -   HCO3 20.0 - 28.0 mmol/L 26.0 24.4 25.5 - -   TCO2 0 - 100 mmol/L 27 26 27 25  -   ACIDBASEDEF 0.0 - 2.0 mmol/L - 1.0 - - -   O2SAT % 99.0 99.0 99.0 - -      Capillary Blood Glucose: Lab Results  Component Value Date   GLUCAP 138 (H) 10/16/2016   GLUCAP 117 (H) 10/16/2016   GLUCAP 108 (H) 10/16/2016   GLUCAP 107 (H) 10/16/2016   GLUCAP 106 (H) 10/16/2016     Exercise Target Goals:    Exercise Program Goal: Individual exercise prescription set with THRR, safety & activity barriers. Participant demonstrates ability to understand and report RPE using BORG scale, to self-measure pulse accurately, and to acknowledge the importance of the exercise prescription.  Exercise Prescription Goal: Starting with aerobic activity 30 plus minutes a day, 3 days per week for initial exercise prescription. Provide home exercise prescription and guidelines that participant acknowledges understanding prior to discharge.  Activity Barriers & Risk Stratification:   6 Minute Walk:     6 Minute Walk    Row Name 12/23/16 1108         6 Minute Walk   Phase Initial     Distance 1200 feet     Distance % Change 0 %     Walk Time 6 minutes     # of Rest Breaks 0     MPH 2.27     METS 2.74     RPE 13     Perceived Dyspnea  13     VO2 Peak 7.01     Symptoms No     Resting HR 53 bpm     Resting BP 120/56     Max Ex. HR 71 bpm     Max Ex. BP 150/66     2 Minute Post BP 126/60        Oxygen  Initial Assessment:     Oxygen Initial Assessment - 12/23/16 1047      Home Oxygen   Home Oxygen Device None   Sleep Oxygen Prescription None  Home Exercise Oxygen Prescription None   Home at Rest Exercise Oxygen Prescription None     Initial 6 min Walk   Oxygen Used None     Program Oxygen Prescription   Program Oxygen Prescription None      Oxygen Re-Evaluation:   Oxygen Discharge (Final Oxygen Re-Evaluation):   Initial Exercise Prescription:     Initial Exercise Prescription - 12/23/16 1100      Date of Initial Exercise RX and Referring Provider   Date 12/23/16   Referring Provider Dr. Oval Linsey     Treadmill   MPH 2   Grade 0   Minutes 15   METs 2.5     NuStep   Level 2   SPM 17   Minutes 20   METs 1.9     Prescription Details   Frequency (times per week) 3   Duration Progress to 30 minutes of continuous aerobic without signs/symptoms of physical distress     Intensity   THRR 40-80% of Max Heartrate [id number]   Ratings of Perceived Exertion 11-13   Perceived Dyspnea 0-4     Progression   Progression Continue progressive overload as per policy without signs/symptoms or physical distress.     Resistance Training   Training Prescription Yes   Weight 1   Reps 10-15      Perform Capillary Blood Glucose checks as needed.  Exercise Prescription Changes:      Exercise Prescription Changes    Row Name 12/28/16 1300 01/17/17 1400 02/11/17 1400 03/02/17 1200 03/10/17 1500     Response to Exercise   Blood Pressure (Admit) 126/60 130/68 128/78 132/70 122/68   Blood Pressure (Exercise) 130/88 146/58 148/60 160/70 150/62   Blood Pressure (Exit) 110/60 130/60 122/70 122/50 130/60   Heart Rate (Admit) 50 bpm 46 bpm 49 bpm 66 bpm 47 bpm   Heart Rate (Exercise) 79 bpm 57 bpm 58 bpm 53 bpm 59 bpm   Heart Rate (Exit) 55 bpm 55 bpm 55 bpm 52 bpm 54 bpm   Rating of Perceived Exertion (Exercise) 9 11 11 13 13    Duration Progress to 30 minutes of  aerobic  without signs/symptoms of physical distress Progress to 30 minutes of  aerobic without signs/symptoms of physical distress Progress to 30 minutes of  aerobic without signs/symptoms of physical distress Progress to 30 minutes of  aerobic without signs/symptoms of physical distress Progress to 30 minutes of  aerobic without signs/symptoms of physical distress   Intensity THRR unchanged THRR unchanged THRR unchanged THRR unchanged THRR unchanged     Progression   Progression Continue to progress workloads to maintain intensity without signs/symptoms of physical distress. Continue to progress workloads to maintain intensity without signs/symptoms of physical distress. Continue to progress workloads to maintain intensity without signs/symptoms of physical distress. Continue to progress workloads to maintain intensity without signs/symptoms of physical distress. Continue to progress workloads to maintain intensity without signs/symptoms of physical distress.     Resistance Training   Training Prescription Yes Yes Yes Yes Yes   Weight 1 2 2 2 2    Reps 10-15 10-15 10-15 10-15 10-15     Treadmill   MPH 2 2.2 2.3 2.3 2.3   Grade 0 0 0 0 0   Minutes 15 15 15 15 15    METs 2.5 2.7 2.8 2.8 2.8     NuStep   Level 2 3 3 4 4    SPM 14 18 25 18 23    Minutes 20 20 20 20  20  METs 3.44 3.45 3.45 3.45 3.45     Home Exercise Plan   Plans to continue exercise at Home (comment) Home (comment) Home (comment) Home (comment) Home (comment)   Frequency Add 2 additional days to program exercise sessions. Add 2 additional days to program exercise sessions. Add 2 additional days to program exercise sessions. Add 2 additional days to program exercise sessions. Add 2 additional days to program exercise sessions.      Exercise Comments:      Exercise Comments    Row Name 12/28/16 1306 01/17/17 1413 02/11/17 1436 03/02/17 1231 03/10/17 1506   Exercise Comments Patient has just started CR and will be progressed in  time. Watts before level increase. Patient is progressing very well in CR.  Patient is doing well in CR and is maintaining her levels Patient is doing well in CR.  Patient is doing well in CR and is maintaining her levels.       Exercise Goals and Review:      Exercise Goals    Row Name 12/23/16 1047             Exercise Goals   Increase Physical Activity Yes       Intervention Provide advice, education, support and counseling about physical activity/exercise needs.;Develop an individualized exercise prescription for aerobic and resistive training based on initial evaluation findings, risk stratification, comorbidities and participant's personal goals.       Expected Outcomes Achievement of increased cardiorespiratory fitness and enhanced flexibility, muscular endurance and strength shown through measurements of functional capacity and personal statement of participant.       Increase Strength and Stamina Yes       Intervention Provide advice, education, support and counseling about physical activity/exercise needs.;Develop an individualized exercise prescription for aerobic and resistive training based on initial evaluation findings, risk stratification, comorbidities and participant's personal goals.       Expected Outcomes Achievement of increased cardiorespiratory fitness and enhanced flexibility, muscular endurance and strength shown through measurements of functional capacity and personal statement of participant.          Exercise Goals Re-Evaluation :     Exercise Goals Re-Evaluation    [place] Name 02/16/17 1426 03/10/17 1632           Exercise Goal Re-Evaluation   Exercise Goals Review Increase Physical Activity;Increase Strenth and Stamina  More energy; do ADL's without fatige.  Increase Physical Activity;Increase Strenth and Stamina      Comments After completing 22 sessions, patient continues to progress with increased strength and stamina. She says has more energy and  is able do do more ADL's.  After completing 30 sessions, patient says she feels stronger. She continues to progress in her exercise program and is doing well. She says she is doing more at home and her husband has noticed she is doing more since starting the program.       Expected Outcomes Patient will complete the program with continued increased strength, stamina, and activity. Patient will complete the program with continued increased strength, stamina, and activity.          Discharge Exercise Prescription (Final Exercise Prescription Changes):     Exercise Prescription Changes - 03/10/17 1500      Response to Exercise   Blood Pressure (Admit) 122/68   Blood Pressure (Exercise) 150/62   Blood Pressure (Exit) 130/60   Heart Rate (Admit) 47 bpm   Heart Rate (Exercise) 59 bpm   Heart Rate (Exit) 54 bpm  Rating of Perceived Exertion (Exercise) 13   Duration Progress to 30 minutes of  aerobic without signs/symptoms of physical distress   Intensity THRR unchanged     Progression   Progression Continue to progress workloads to maintain intensity without signs/symptoms of physical distress.     Resistance Training   Training Prescription Yes   Weight 2   Reps 10-15     Treadmill   MPH 2.3   Grade 0   Minutes 15   METs 2.8     NuStep   Level 4   SPM 23   Minutes 20   METs 3.45     Home Exercise Plan   Plans to continue exercise at Home (comment)   Frequency Add 2 additional days to program exercise sessions.      Nutrition:  Target Goals: Understanding of nutrition guidelines, daily intake of sodium <1559m, cholesterol <2041m calories 30% from fat and 7% or less from saturated fats, daily to have 5 or more servings of fruits and vegetables.  Biometrics:     Pre Biometrics - 12/23/16 1111      Pre Biometrics   Height 5' 3"  (1.6 m)   Weight 152 lb 5.4 oz (69.1 kg)   Waist Circumference 35 inches   Hip Circumference 38 inches   Waist to Hip Ratio 0.92 %   BMI  (Calculated) 27   Triceps Skinfold 18 mm   % Body Fat 37.3 %   Grip Strength 43 kg   Flexibility 0 in   Single Leg Stand 5 seconds       Nutrition Therapy Plan and Nutrition Goals:   Nutrition Discharge: Rate Your Plate Scores:     Nutrition Assessments - 12/23/16 1048      MEDFICTS Scores   Pre Score 50      Nutrition Goals Re-Evaluation:   Nutrition Goals Discharge (Final Nutrition Goals Re-Evaluation):   Psychosocial: Target Goals: Acknowledge presence or absence of significant depression and/or stress, maximize coping skills, provide positive support system. Participant is able to verbalize types and ability to use techniques and skills needed for reducing stress and depression.  Initial Review & Psychosocial Screening:     Initial Psych Review & Screening - 12/23/16 1119      Initial Review   Current issues with Current Depression;Current Sleep Concerns     Family Dynamics   Good Support System? Yes     Barriers   Psychosocial barriers to participate in program The patient should benefit from training in stress management and relaxation.;Psychosocial barriers identified (see note)  Patient's QOL score was 22.93 and her PHQ-9 score was 11 indicating some depression.      Screening Interventions   Interventions Encouraged to exercise  Patient says her depressed feeling started after her surgery and is related to her lack of energy to do anything. She feels she will improve when she feels more like doing things.       Quality of Life Scores:     Quality of Life - 12/23/16 1112      Quality of Life Scores   Health/Function Pre 20.3 %   Socioeconomic Pre 25.5 %   Psych/Spiritual Pre 24.86 %   Family Pre 24 %   GLOBAL Pre 22.93 %      PHQ-9: Recent Review Flowsheet Data    [facility] 12/23/2016 12/20/2016 11/21/2015 12/30/2014 12/05/2013   Decreased Interest 0 0 0 0 0   Down, Depressed, Hopeless 2 1  0 0  0   PHQ - 2 Score 2 1 0 0 0    Altered sleeping 3 - - - -   Tired, decreased energy 3 - - - -   Change in appetite 0 - - - -   Feeling bad or failure about yourself  2 - - - -   Trouble concentrating 1 - - - -   Moving slowly or fidgety/restless 0 - - - -   Suicidal thoughts 0 - - - -   PHQ-9 Score 11 - - - -   Difficult doing work/chores Somewhat difficult - - - -     Interpretation of Total Score  Total Score Depression Severity:  1-4 = Minimal depression, 5-9 = Mild depression, 10-14 = Moderate depression, 15-19 = Moderately severe depression, 20-27 = Severe depression   Psychosocial Evaluation and Intervention:     Psychosocial Evaluation - 12/23/16 1122      Psychosocial Evaluation & Interventions   Interventions Stress management education;Relaxation education;Encouraged to exercise with the program and follow exercise prescription   Comments Patient says her depressed feeling started after her surgery and is related to her lack of energy to do a lot.    Expected Outcomes Patient will complete the program meeting her goal of improved energy which will improve her depressed feeling.    Continue Psychosocial Services  Follow up required by staff      Psychosocial Re-Evaluation:     Psychosocial Re-Evaluation    [place] Name 01/19/17 1356 02/16/17 1429 03/10/17 1634         Psychosocial Re-Evaluation   Current issues with None Identified Current Sleep Concerns Current Sleep Concerns     Comments  - Patient continues to want to sleep more during the day but has improved. She feels better on the days she comes to CR when she has to get up earlier. She feels more motivated on those days. Will continue to monitor. She continues to take diazepam for sleep and anxiety.  Patient continues to want to sleep to much but continues to improve with the program. Will continue to monitor.      Expected Outcomes Patient will have no psychosocial issues identified at discharge.  Patient will have improved PHQ-9 and QOL scores  at discharge.  Patient will have improved QOL and PHQ-9 scores at [place].      Interventions Encouraged to attend Cardiac Rehabilitation for the exercise Relaxation education;Encouraged to attend Cardiac Rehabilitation for the exercise;Stress management education Relaxation education;Stress management education;Encouraged to attend Cardiac Rehabilitation for the exercise     Continue Psychosocial Services  No Follow up required Follow up required by staff Follow up required by staff        Psychosocial Discharge (Final Psychosocial Re-Evaluation):     Psychosocial Re-Evaluation - 03/10/17 1634      Psychosocial Re-Evaluation   Current issues with Current Sleep Concerns   Comments Patient continues to want to sleep to much but continues to improve with the program. Will continue to monitor.    Expected Outcomes Patient will have improved QOL and PHQ-9 scores at disharge.    Interventions Relaxation education;Stress management education;Encouraged to attend Cardiac Rehabilitation for the exercise   Continue Psychosocial Services  Follow up required by staff      Vocational Rehabilitation: Provide vocational rehab assistance to qualifying candidates.   Vocational Rehab Evaluation & Intervention:     Vocational Rehab - 12/23/16 1046      Initial Vocational Rehab Evaluation & Intervention  Assessment shows need for Vocational Rehabilitation No      Education: Education Goals: Education classes will be provided on a weekly basis, covering required topics. Participant will state understanding/return demonstration of topics presented.  Learning Barriers/Preferences:     Learning Barriers/Preferences - 12/23/16 1045      Learning Barriers/Preferences   Learning Barriers None   Learning Preferences Skilled Demonstration;Audio;Individual Instruction;Verbal Instruction      Education Topics: Hypertension, Hypertension Reduction -Define heart disease and high blood pressure.  Discus how high blood pressure affects the body and ways to reduce high blood pressure.   CARDIAC REHAB PHASE II EXERCISE from 03/09/2017 in [facility]  Date  01/26/17  Educator  Russella Dar  Instruction Review Code  2- meets goals/outcomes      Exercise and Your Heart -Discuss why it is important to exercise, the FITT principles of exercise, normal and abnormal responses to exercise, and how to exercise safely.   CARDIAC REHAB PHASE II EXERCISE from 03/09/2017 in [place]  Date  02/02/17  Educator  DC  Instruction Review Code  2- meets goals/outcomes      Angina -Discuss definition of angina, causes of angina, treatment of angina, and how to decrease risk of having angina.   CARDIAC REHAB PHASE II EXERCISE from 03/09/2017 in [place]  Date  02/09/17  Educator  DC  Instruction Review Code  2- meets goals/outcomes      Cardiac Medications -Review what the following cardiac medications are used for, how they affect the body, and side effects that may occur when taking the medications.  Medications include Aspirin, Beta blockers, calcium channel blockers, ACE Inhibitors, angiotensin receptor blockers, diuretics, digoxin, and antihyperlipidemics.   CARDIAC REHAB PHASE II EXERCISE from 03/09/2017 in [place]  Date  02/16/17  Educator  DJ  Instruction Review Code  2- meets goals/outcomes      Congestive Heart Failure -Discuss the definition of CHF, how to live with CHF, the signs and symptoms of CHF, and how keep track of weight and sodium intake.   CARDIAC REHAB PHASE II EXERCISE from 03/09/2017 in [place]  Date  02/23/17  Educator  DC  Instruction Review Code  2- meets goals/outcomes      Heart Disease and Intimacy -Discus the effect sexual activity has on the heart, how changes occur during intimacy as we age, and safety during sexual activity.   CARDIAC  REHAB PHASE II EXERCISE from 03/09/2017 in [place]  Date  03/02/17  Educator  DJ  Instruction Review Code  2- meets goals/outcomes      Smoking Cessation / COPD -Discuss different methods to quit smoking, the health benefits of quitting smoking, and the definition of COPD.   CARDIAC REHAB PHASE II EXERCISE from 03/09/2017 in [place]  Date  03/09/17  Educator  Russella Dar  Instruction Review Code  2- meets goals/outcomes      Nutrition I: Fats -Discuss the types of cholesterol, what cholesterol does to the heart, and how cholesterol levels can be controlled.   Nutrition II: Labels -Discuss the different components of food labels and how to read food label   Heart Parts and Heart Disease -Discuss the anatomy of the heart, the pathway of blood circulation through the heart, and these are affected by heart disease.   CARDIAC REHAB PHASE II EXERCISE from 03/09/2017 in [place]  Date  12/29/16  Educator  DJ  Instruction Review Code  2- meets goals/outcomes      Stress I: Signs and Symptoms -Discuss the causes of stress, how stress may lead to anxiety and depression, and ways to limit stress.   CARDIAC REHAB PHASE II EXERCISE from 03/09/2017 in [place]  Date  01/05/17  Educator  D. Coad  Instruction Review Code  2- meets goals/outcomes      Stress II: Relaxation -Discuss different types of relaxation techniques to limit stress.   CARDIAC REHAB PHASE II EXERCISE from 03/09/2017 in [place]  Date  01/12/17  Educator  DJ  Instruction Review Code  2- meets goals/outcomes      Warning Signs of Stroke / TIA -Discuss definition of a stroke, what the signs and symptoms are of a stroke, and how to identify when someone is having stroke.   CARDIAC REHAB PHASE II EXERCISE from 03/09/2017 in [place]  Date  01/19/17  Educator  Dc   Instruction Review Code  2- meets goals/outcomes      Knowledge Questionnaire Score:     Knowledge Questionnaire Score - 12/23/16 1046      Knowledge Questionnaire Score   Pre Score 21/28      Core Components/Risk Factors/Patient Goals at Admission:     Personal Goals and Risk Factors at Admission - 12/23/16 1048      Core Components/Risk Factors/Patient Goals on Admission    Weight Management Yes   Intervention Weight Management: Develop a combined nutrition and exercise program designed to reach desired caloric intake, while maintaining appropriate intake of nutrient and fiber, sodium and fats, and appropriate energy expenditure required for the weight goal.;Weight Management: Provide education and appropriate resources to help participant work on and attain dietary goals.;Weight Management/Obesity: Establish reasonable short term and long term weight goals.;Obesity: Provide education and appropriate resources to help participant work on and attain dietary goals.   Admit Weight 152 lb 6.4 oz (69.1 kg)   Goal Weight: Short Term 144 lb 6.4 oz (65.5 kg)   Goal Weight: Long Term 134 lb 6.4 oz (61 kg)   Expected Outcomes Long Term: Adherence to nutrition and physical activity/exercise program aimed toward attainment of established weight goal;Short Term: Continue to assess and modify interventions until short term weight is achieved   Improve shortness of breath with ADL's Yes   Intervention Provide education, individualized exercise plan and daily activity instruction to help decrease symptoms of SOB with activities of daily living.   Expected Outcomes Short Term: Achieves a reduction of symptoms when performing activities of daily living.   Personal Goal Other Yes   Personal Goal Have more energy; increase strength and stamina; lose 10 lbs.    Intervention Patient will attend CR 3 days/week and supplement with exercise 2 days/week at home.    Expected Outcomes Patient will meet her  personal goals.       Core Components/Risk Factors/Patient Goals Review:      Goals and Risk Factor Review    Row Name 12/23/16 1051 01/19/17 1354 02/16/17 1425 03/10/17 1629       Core Components/Risk Factors/Patient Goals Review   Personal Goals Review Weight Management/Obesity;Improve shortness of breath with ADL's Weight Management/Obesity;Improve shortness of breath with ADL's Weight Management/Obesity;Improve shortness of breath with ADL's Weight Management/Obesity;Improve shortness of breath with ADL's  Lose 10 lbs; more energy. do ADL's without fatigue    Review  - Patient has completed 11 sessions gaining 0.6 lbs. She reports  some improvement in her SOB. Will continue to monitor. Patient has completed 22 sessions gaining 1.6 lbs. Her SOB has improved and she says she feels better overall. She is doing well in the program. Patient has completed 30 sessions gaining 1.6 lbs. She has done well in the program. She says he does have more energy and feel better overally. She is able to do more ADL's. Her b/p continues to be elvated. Her MD is aware making some medication adjustments recently.     Expected Outcomes  - Patient will complete the program meeting her personal goals.  Patient will complete the program meeting her personal goals.  Patient will complete the program and continue to exercise meeting her personal goals.        Core Components/Risk Factors/Patient Goals at Discharge (Final Review):      Goals and Risk Factor Review - 03/10/17 1629      Core Components/Risk Factors/Patient Goals Review   Personal Goals Review Weight Management/Obesity;Improve shortness of breath with ADL's  Lose 10 lbs; more energy. do ADL's without fatigue   Review Patient has completed 30 sessions gaining 1.6 lbs. She has done well in the program. She says he does have more energy and feel better overally. She is able to do more ADL's. Her b/p continues to be elvated. Her MD is aware making some  medication adjustments recently.    Expected Outcomes Patient will complete the program and continue to exercise meeting her personal goals.       ITP Comments:     ITP Comments    Row Name 12/29/16 1416 12/30/16 1537         ITP Comments Patient new to the program completing 3 sessions. Will continue to monitor for progress.  Patient met with Registered Dietitian to discuss nutrition topics including: Heart healthty eating, heart health cooking and make smart choices when shopping; Portion control; weight management; and hydration. Patient attended a group session with the hospital chaplian called [facility] to discuss and share how this recent diagnosis has effected their life         Comments: ITP 30 Day REVIEW Patient doing well in the program. Will continue to monitor for progress.

## 2017-03-11 ENCOUNTER — Encounter (HOSPITAL_COMMUNITY)
Admission: RE | Admit: 2017-03-11 | Discharge: 2017-03-11 | Disposition: A | Discharge status: Home / Self Care | Payer: Medicare Other | Source: Ambulatory Visit | Attending: Cardiovascular Disease | Admitting: Cardiovascular Disease

## 2017-03-11 DIAGNOSIS — Z8679 Personal history of other diseases of the circulatory system: Secondary | ICD-10-CM | POA: Diagnosis not present

## 2017-03-11 DIAGNOSIS — Z951 Presence of aortocoronary bypass graft: Secondary | ICD-10-CM

## 2017-03-11 DIAGNOSIS — Z953 Presence of xenogenic heart valve: Secondary | ICD-10-CM | POA: Diagnosis not present

## 2017-03-11 DIAGNOSIS — Z9889 Other specified postprocedural states: Secondary | ICD-10-CM | POA: Diagnosis not present

## 2017-03-11 NOTE — Progress Notes (Signed)
Daily Session Note  Patient Details  Name: Catherine [name] MRN: [id number] Date of Birth: April 11, 1935 Referring Provider:     CARDIAC REHAB PHASE II ORIENTATION from 12/23/2016 in [place]  Referring Provider  Dr. Oval Linsey      Encounter Date: 03/11/2017  Check In:     Session Check In - 03/11/17 0930      Check-In   Location [facility]   Staff Present Suzanne Boron, BS, EP, Exercise Physiologist;Jarita Raval Wynetta Emery, RN, BSN   Supervising physician immediately available to respond to emergencies See telemetry face sheet for immediately available MD   Medication changes reported     No   Fall or balance concerns reported    No   Tobacco Cessation No Change   Warm-up and Cool-down Performed as group-led instruction   Resistance Training Performed Yes   VAD Patient? No     Pain Assessment   Currently in Pain? No/denies   Pain Score 0-No pain   Multiple Pain Sites No      Capillary Blood Glucose: No results found for this or any previous visit (from the past 24 hour(s)).      Exercise Prescription Changes - 03/10/17 1500      Response to Exercise   Blood Pressure (Admit) 122/68   Blood Pressure (Exercise) 150/62   Blood Pressure (Exit) 130/60   Heart Rate (Admit) 47 bpm   Heart Rate (Exercise) 59 bpm   Heart Rate (Exit) 54 bpm   Rating of Perceived Exertion (Exercise) 13   Duration Progress to 30 minutes of  aerobic without signs/symptoms of physical distress   Intensity THRR unchanged     Progression   Progression Continue to progress workloads to maintain intensity without signs/symptoms of physical distress.     Resistance Training   Training Prescription Yes   Weight 2   Reps 10-15     Treadmill   MPH 2.3   Grade 0   Minutes 15   METs 2.8     NuStep   Level 4   SPM 23   Minutes 20   METs 3.45     Home Exercise Plan   Plans to continue exercise at Home (comment)   Frequency Add 2 additional days to program  exercise sessions.      History  Smoking Status  . Former Smoker  . Packs/day: 1.00  . Years: 15.00  . Types: Cigarettes  . Quit date: 09/28/1975  Smokeless Tobacco  . Never Used    Goals Met:  Independence with exercise equipment Exercise tolerated well No report of cardiac concerns or symptoms Strength training completed today  Goals Unmet:  Not Applicable  Comments: Check out 1030.   Dr. [name] Sable is Medical Director for [facility] Cardiac and Pulmonary Rehab.

## 2017-03-14 ENCOUNTER — Other Ambulatory Visit: Payer: Self-pay | Admitting: Family Medicine

## 2017-03-14 ENCOUNTER — Encounter (HOSPITAL_COMMUNITY): Payer: Medicare Other

## 2017-03-14 NOTE — Telephone Encounter (Signed)
Would love to see her taper off.  We discussed this last year- but she was getting ready to have her heart surgery   Refill once.  Will discuss further at follow up.

## 2017-03-14 NOTE — Telephone Encounter (Signed)
Last refill 09/14/16 and last office visit with Georgina Snell 02/24/17.  Okay to fill?

## 2017-03-14 NOTE — Telephone Encounter (Signed)
Left message on machine for patient to call back and schedule a follow up visit with Dr Elease Hashimoto, then call in the refill.

## 2017-03-16 ENCOUNTER — Encounter (HOSPITAL_COMMUNITY)
Admission: RE | Admit: 2017-03-16 | Discharge: 2017-03-16 | Disposition: A | Discharge status: Home / Self Care | Payer: Medicare Other | Source: Ambulatory Visit | Attending: Cardiovascular Disease | Admitting: Cardiovascular Disease

## 2017-03-16 DIAGNOSIS — Z953 Presence of xenogenic heart valve: Secondary | ICD-10-CM

## 2017-03-16 DIAGNOSIS — Z951 Presence of aortocoronary bypass graft: Secondary | ICD-10-CM | POA: Diagnosis not present

## 2017-03-16 DIAGNOSIS — Z9889 Other specified postprocedural states: Secondary | ICD-10-CM | POA: Diagnosis not present

## 2017-03-16 DIAGNOSIS — Z8679 Personal history of other diseases of the circulatory system: Secondary | ICD-10-CM | POA: Diagnosis not present

## 2017-03-16 NOTE — Progress Notes (Signed)
Daily Session Note  Patient Details  Name: Catherine [name] MRN: [id number] Date of Birth: Oct 16, 1934 Referring Provider:     [place] from 12/23/2016 in [place]  Referring Provider  Dr. Oval Linsey      Encounter Date: 03/16/2017  Check In:     Session Check In - 03/16/17 0930      Check-In   Location [facility]   Staff Present Diane Angelina Pih, MS, EP, [facility], Exercise Physiologist;Slayden Mennenga Wynetta Emery, RN, BSN;Gregory Cowan, BS, EP, Exercise Physiologist   Supervising physician immediately available to respond to emergencies See telemetry face sheet for immediately available MD   Medication changes reported     No   Fall or balance concerns reported    No   Tobacco Cessation No Change   Warm-up and Cool-down Performed as group-led instruction   Resistance Training Performed Yes   VAD Patient? No     Pain Assessment   Currently in Pain? No/denies   Pain Score 0-No pain   Multiple Pain Sites No      Capillary Blood Glucose: No results found for this or any previous visit (from the past 24 hour(s)).    History  Smoking Status  . Former Smoker  . Packs/day: 1.00  . Years: 15.00  . Types: Cigarettes  . Quit date: 09/28/1975  Smokeless Tobacco  . Never Used    Goals Met:  Independence with exercise equipment Exercise tolerated well No report of cardiac concerns or symptoms Strength training completed today  Goals Unmet:  Not Applicable  Comments: Check out 1030.   Dr. [name] Sable is Medical Director for [facility] Cardiac and Pulmonary Rehab.

## 2017-03-18 ENCOUNTER — Encounter (HOSPITAL_COMMUNITY)
Admission: RE | Admit: 2017-03-18 | Discharge: 2017-03-18 | Disposition: A | Discharge status: Home / Self Care | Payer: Medicare Other | Source: Ambulatory Visit | Attending: Cardiovascular Disease | Admitting: Cardiovascular Disease

## 2017-03-18 DIAGNOSIS — Z953 Presence of xenogenic heart valve: Secondary | ICD-10-CM

## 2017-03-18 DIAGNOSIS — Z8679 Personal history of other diseases of the circulatory system: Secondary | ICD-10-CM | POA: Diagnosis not present

## 2017-03-18 DIAGNOSIS — Z951 Presence of aortocoronary bypass graft: Secondary | ICD-10-CM

## 2017-03-18 DIAGNOSIS — Z9889 Other specified postprocedural states: Secondary | ICD-10-CM | POA: Diagnosis not present

## 2017-03-18 NOTE — Progress Notes (Signed)
Daily Session Note  Patient Details  Name: Catherine [name] MRN: [id number] Date of Birth: 1935-07-17 Referring Provider:     CARDIAC REHAB PHASE II ORIENTATION from 12/23/2016 in [place]  Referring Provider  Dr. Oval Linsey      Encounter Date: 03/18/2017  Check In:     Session Check In - 03/18/17 0930      Check-In   Location [facility]   Staff Present Diane Angelina Pih, MS, EP, [facility], Exercise Physiologist;Ceira Hoeschen Wynetta Emery, RN, BSN   Supervising physician immediately available to respond to emergencies See telemetry face sheet for immediately available MD   Medication changes reported     No   Fall or balance concerns reported    No   Tobacco Cessation No Change   Warm-up and Cool-down Performed as group-led instruction   Resistance Training Performed Yes   VAD Patient? No     Pain Assessment   Currently in Pain? No/denies   Pain Score 0-No pain   Multiple Pain Sites No      Capillary Blood Glucose: No results found for this or any previous visit (from the past 24 hour(s)).    History  Smoking Status  . Former Smoker  . Packs/day: 1.00  . Years: 15.00  . Types: Cigarettes  . Quit date: 09/28/1975  Smokeless Tobacco  . Never Used    Goals Met:  Independence with exercise equipment Exercise tolerated well No report of cardiac concerns or symptoms Strength training completed today  Goals Unmet:  Not Applicable  Comments: Check out 1030.   Dr. [name] Sable is Medical Director for [facility] Cardiac and Pulmonary Rehab.

## 2017-03-21 ENCOUNTER — Encounter (HOSPITAL_COMMUNITY)
Admission: RE | Admit: 2017-03-21 | Discharge: 2017-03-21 | Disposition: A | Discharge status: Home / Self Care | Payer: Medicare Other | Source: Ambulatory Visit | Attending: Cardiovascular Disease | Admitting: Cardiovascular Disease

## 2017-03-21 DIAGNOSIS — Z9889 Other specified postprocedural states: Secondary | ICD-10-CM | POA: Diagnosis not present

## 2017-03-21 DIAGNOSIS — Z951 Presence of aortocoronary bypass graft: Secondary | ICD-10-CM | POA: Diagnosis not present

## 2017-03-21 DIAGNOSIS — Z953 Presence of xenogenic heart valve: Secondary | ICD-10-CM | POA: Diagnosis not present

## 2017-03-21 DIAGNOSIS — Z8679 Personal history of other diseases of the circulatory system: Secondary | ICD-10-CM | POA: Diagnosis not present

## 2017-03-21 NOTE — Progress Notes (Signed)
Daily Session Note  Patient Details  Name: Catherine [name] MRN: [id number] Date of Birth: [birth year] Referring Provider:     [place] from 12/23/2016 in [place]  Referring Provider  Dr. Oval Linsey      Encounter Date: 03/21/2017  Check In:     Session Check In - 03/21/17 0930      Check-In   Location [facility]   Staff Present Suzanne Boron, BS, EP, Exercise Physiologist;Michella Detjen Wynetta Emery, RN, BSN   Supervising physician immediately available to respond to emergencies See telemetry face sheet for immediately available MD   Medication changes reported     No   Fall or balance concerns reported    No   Tobacco Cessation No Change   Warm-up and Cool-down Performed as group-led instruction   Resistance Training Performed Yes   VAD Patient? No     Pain Assessment   Currently in Pain? No/denies   Pain Score 0-No pain   Multiple Pain Sites No      Capillary Blood Glucose: No results found for this or any previous visit (from the past 24 hour(s)).    History  Smoking Status  . Former Smoker  . Packs/day: 1.00  . Years: 15.00  . Types: Cigarettes  . Quit date: 09/28/1975  Smokeless Tobacco  . Never Used    Goals Met:  Independence with exercise equipment Exercise tolerated well No report of cardiac concerns or symptoms Strength training completed today  Goals Unmet:  Not Applicable  Comments: Check out 1030.   Dr. [name] Sable is Medical Director for [facility] Cardiac and Pulmonary Rehab.

## 2017-03-23 ENCOUNTER — Encounter (HOSPITAL_COMMUNITY)
Admission: RE | Admit: 2017-03-23 | Discharge: 2017-03-23 | Disposition: A | Discharge status: Home / Self Care | Payer: Medicare Other | Source: Ambulatory Visit | Attending: Cardiovascular Disease | Admitting: Cardiovascular Disease

## 2017-03-23 DIAGNOSIS — Z953 Presence of xenogenic heart valve: Secondary | ICD-10-CM

## 2017-03-23 DIAGNOSIS — Z951 Presence of aortocoronary bypass graft: Secondary | ICD-10-CM | POA: Diagnosis not present

## 2017-03-23 DIAGNOSIS — Z8679 Personal history of other diseases of the circulatory system: Secondary | ICD-10-CM | POA: Diagnosis not present

## 2017-03-23 DIAGNOSIS — Z9889 Other specified postprocedural states: Secondary | ICD-10-CM | POA: Diagnosis not present

## 2017-03-23 NOTE — Progress Notes (Signed)
Daily Session Note  Patient Details  Name: Catherine [name] MRN: [id number] Date of Birth: Feb 18, 1935 Referring Provider:     CARDIAC REHAB PHASE II ORIENTATION from 12/23/2016 in [place]  Referring Provider  Dr. Oval Linsey      Encounter Date: 03/23/2017  Check In:     Session Check In - 03/23/17 0931      Check-In   Location [facility]   Staff Present Russella Dar, MS, EP, [facility], Exercise Physiologist;Savannaha Stonerock Wynetta Emery, RN, BSN;Gregory Cowan, BS, EP, Exercise Physiologist   Supervising physician immediately available to respond to emergencies See telemetry face sheet for immediately available MD   Medication changes reported     No   Fall or balance concerns reported    No   Tobacco Cessation No Change   Warm-up and Cool-down Performed as group-led instruction   Resistance Training Performed Yes   VAD Patient? No     Pain Assessment   Currently in Pain? No/denies   Pain Score 0-No pain   Multiple Pain Sites No      Capillary Blood Glucose: No results found for this or any previous visit (from the past 24 hour(s)).    History  Smoking Status  . Former Smoker  . Packs/day: 1.00  . Years: 15.00  . Types: Cigarettes  . Quit date: 09/28/1975  Smokeless Tobacco  . Never Used    Goals Met:  Independence with exercise equipment Exercise tolerated well No report of cardiac concerns or symptoms Strength training completed today  Goals Unmet:  Not Applicable  Comments: Check out 1030.   Dr. [name] Sable is Medical Director for [facility] Cardiac and Pulmonary Rehab.

## 2017-03-25 ENCOUNTER — Encounter: Payer: Self-pay | Admitting: Family Medicine

## 2017-03-25 ENCOUNTER — Encounter (HOSPITAL_COMMUNITY)
Admission: RE | Admit: 2017-03-25 | Discharge: 2017-03-25 | Disposition: A | Discharge status: Home / Self Care | Payer: Medicare Other | Source: Ambulatory Visit | Attending: Cardiovascular Disease | Admitting: Cardiovascular Disease

## 2017-03-25 ENCOUNTER — Ambulatory Visit (INDEPENDENT_AMBULATORY_CARE_PROVIDER_SITE_OTHER): Payer: Medicare Other | Admitting: Family Medicine

## 2017-03-25 VITALS — Ht 63.0 in | Wt 153.3 lb

## 2017-03-25 VITALS — BP 122/70 | HR 71 | Temp 98.9°F | Wt 151.2 lb

## 2017-03-25 DIAGNOSIS — Z953 Presence of xenogenic heart valve: Secondary | ICD-10-CM | POA: Diagnosis not present

## 2017-03-25 DIAGNOSIS — F5104 Psychophysiologic insomnia: Secondary | ICD-10-CM

## 2017-03-25 DIAGNOSIS — Z8679 Personal history of other diseases of the circulatory system: Secondary | ICD-10-CM | POA: Diagnosis not present

## 2017-03-25 DIAGNOSIS — Z9889 Other specified postprocedural states: Secondary | ICD-10-CM | POA: Diagnosis not present

## 2017-03-25 DIAGNOSIS — Z951 Presence of aortocoronary bypass graft: Secondary | ICD-10-CM

## 2017-03-25 NOTE — Patient Instructions (Signed)
Try to take the Diazepam just at night Try to eventually reduce to one half at night.

## 2017-03-25 NOTE — Progress Notes (Signed)
Discharge Summary  Patient Details  Name: Catherine [name] MRN: [id number] Date of Birth: 22-Mar-1935 Referring Provider:     CARDIAC REHAB PHASE II ORIENTATION from 12/23/2016 in [place]  Referring Provider  Dr. Oval Linsey       Number of Visits: 36  Reason for Discharge:  Patient reached a stable level of exercise. Patient independent in their exercise.  Smoking History:  History  Smoking Status  . Former Smoker  . Packs/day: 1.00  . Years: 15.00  . Types: Cigarettes  . Quit date: 09/28/1975  Smokeless Tobacco  . 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 Oval Linsey     Treadmill   MPH 2   Grade 0   Minutes 15   METs 2.5     NuStep   Level 2   SPM 17   Minutes 20   METs 1.9     Prescription Details   Frequency (times per week) 3   Duration Progress to 30 minutes of continuous aerobic without signs/symptoms of physical distress     Intensity   THRR 40-80% of Max Heartrate [phone]   Ratings of Perceived Exertion 11-13   Perceived Dyspnea 0-4     Progression   Progression Continue progressive overload as per policy without signs/symptoms or physical distress.     Resistance Training   Training Prescription Yes   Weight 1   Reps 10-15      Discharge Exercise Prescription (Final Exercise Prescription Changes):     Exercise Prescription Changes - 03/10/17 1500      Response to Exercise   Blood Pressure (Admit) 122/68   Blood Pressure (Exercise) 150/62   Blood Pressure (Exit) 130/60   Heart Rate (Admit) 47 bpm   Heart Rate (Exercise) 59 bpm   Heart Rate (Exit) 54 bpm   Rating of Perceived Exertion (Exercise) 13   Duration Progress to 30 minutes of  aerobic without  signs/symptoms of physical distress   Intensity THRR unchanged     Progression   Progression Continue to progress workloads to maintain intensity without signs/symptoms of physical distress.     Resistance Training   Training Prescription Yes   Weight 2   Reps 10-15     Treadmill   MPH 2.3   Grade 0   Minutes 15   METs 2.8     NuStep   Level 4   SPM 23   Minutes 20   METs 3.45     Home Exercise Plan   Plans to continue exercise at Home (comment)   Frequency Add 2 additional days to program exercise sessions.      Functional Capacity:     6 Minute Walk    Row Name 12/23/16 1108 03/25/17 1409       6 Minute Walk   Phase Initial Discharge    Distance 1200 feet 1400 feet    Distance % Change 0 % 16.67 %    Walk Time 6 minutes 6 minutes    # of Rest Breaks 0 0    MPH 2.27 2.65    METS 2.74 3.03    RPE 13 13    Perceived Dyspnea  13 14  VO2 Peak 7.01 10.71    Symptoms No No    Resting HR 53 bpm 71 bpm    Resting BP 120/56 138/82    Max Ex. HR 71 bpm 132 bpm    Max Ex. BP 150/66 154/80    2 Minute Post BP 126/60 140/78       Psychological, QOL, Others - Outcomes: PHQ 2/9: Depression screen [facility] 2/9 03/25/2017 12/23/2016 12/20/2016 11/21/2015 12/30/2014  Decreased Interest 0 0 0 0 0  Down, Depressed, Hopeless 0 2 1 0 0  PHQ - 2 Score 0 2 1 0 0  Altered sleeping 0 3 - - -  Tired, decreased energy 1 3 - - -  Change in appetite 0 0 - - -  Feeling bad or failure about yourself  0 2 - - -  Trouble concentrating 2 1 - - -  Moving slowly or fidgety/restless 0 0 - - -  Suicidal thoughts 0 0 - - -  PHQ-9 Score 3 11 - - -  Difficult doing work/chores - Somewhat difficult - - -  Some recent data might be hidden    Quality of Life:     Quality of Life - 03/25/17 1410      Quality of Life Scores   Health/Function Pre 20.3 %   Health/Function Post 24 %   Health/Function % Change 18.23 %   Socioeconomic Pre 25.5 %   Socioeconomic Post 25 %   Socioeconomic %  Change  -1.96 %   Psych/Spiritual Pre 24.86 %   Psych/Spiritual Post 28.29 %   Psych/Spiritual % Change 13.8 %   Family Pre 24 %   Family Post 27.6 %   Family % Change 15 %   GLOBAL Pre 22.93 %   GLOBAL Post 25.64 %   GLOBAL % Change 11.82 %      Personal Goals: Goals established at orientation with interventions provided to work toward goal.     Personal Goals and Risk Factors at Admission - 12/23/16 1048      Core Components/Risk Factors/Patient Goals on Admission    Weight Management Yes   Intervention Weight Management: Develop a combined nutrition and exercise program designed to reach desired caloric intake, while maintaining appropriate intake of nutrient and fiber, sodium and fats, and appropriate energy expenditure required for the weight goal.;Weight Management: Provide education and appropriate resources to help participant work on and attain dietary goals.;Weight Management/Obesity: Establish reasonable short term and long term weight goals.;Obesity: Provide education and appropriate resources to help participant work on and attain dietary goals.   Admit Weight 152 lb 6.4 oz (69.1 kg)   Goal Weight: Short Term 144 lb 6.4 oz (65.5 kg)   Goal Weight: Long Term 134 lb 6.4 oz (61 kg)   Expected Outcomes Long Term: Adherence to nutrition and physical activity/exercise program aimed toward attainment of established weight goal;Short Term: Continue to assess and modify interventions until short term weight is achieved   Improve shortness of breath with ADL's Yes   Intervention Provide education, individualized exercise plan and daily activity instruction to help decrease symptoms of SOB with activities of daily living.   Expected Outcomes Short Term: Achieves a reduction of symptoms when performing activities of daily living.   Personal Goal Other Yes   Personal Goal Have more energy; increase strength and stamina; lose 10 lbs.    Intervention Patient will attend CR 3 days/week  and supplement with exercise 2 days/week at home.    Expected Outcomes  Patient will meet her personal goals.        Personal Goals Discharge:     Goals and Risk Factor Review    Row Name 12/23/16 1051 01/19/17 1354 02/16/17 1425 03/10/17 1629 03/25/17 1507     Core Components/Risk Factors/Patient Goals Review   Personal Goals Review Weight Management/Obesity;Improve shortness of breath with ADL's Weight Management/Obesity;Improve shortness of breath with ADL's Weight Management/Obesity;Improve shortness of breath with ADL's Weight Management/Obesity;Improve shortness of breath with ADL's  Lose 10 lbs; more energy. do ADL's without fatigue Weight Management/Obesity  Lose 10 lbs; increase energy, strength, and stamina; be able to do ADL's without fatigue.   Review  - Patient has completed 11 sessions gaining 0.6 lbs. She reports some improvement in her SOB. Will continue to monitor. Patient has completed 22 sessions gaining 1.6 lbs. Her SOB has improved and she says she feels better overall. She is doing well in the program. Patient has completed 30 sessions gaining 1.6 lbs. She has done well in the program. She says he does have more energy and feel better overally. She is able to do more ADL's. Her b/p continues to be elvated. Her MD is aware making some medication adjustments recently.  Patient graduated at 36 sessions maintaining her weight. Her Medficts score did not improve at discharge. She did well in the program overall. She says she feels stronger and better overall and has more energy. Her balance and grip strength imroved. She plans to continue exercising at the [facility] and hopes to work toward losing 10 lbs.    Expected Outcomes  - Patient will complete the program meeting her personal goals.  Patient will complete the program meeting her personal goals.  Patient will complete the program and continue to exercise meeting her personal goals.  Patient will continue exercising and conitnue to  work toward meeting her personal goals.       Nutrition & Weight - Outcomes:     Pre Biometrics - 12/23/16 1111      Pre Biometrics   Height 5\' 3"  (1.6 m)   Weight 152 lb 5.4 oz (69.1 kg)   Waist Circumference 35 inches   Hip Circumference 38 inches   Waist to Hip Ratio 0.92 %   BMI (Calculated) 27   Triceps Skinfold 18 mm   % Body Fat 37.3 %   Grip Strength 43 kg   Flexibility 0 in   Single Leg Stand 5 seconds         Post Biometrics - 03/25/17 1409       Post  Biometrics   Height 5\' 3"  (1.6 m)   Weight 153 lb 4.9 oz (69.5 kg)   Waist Circumference 36 inches   Hip Circumference 39 inches   Waist to Hip Ratio 0.92 %   BMI (Calculated) 27.2   Triceps Skinfold 18 mm   % Body Fat 37.7 %   Grip Strength 48.13 kg   Flexibility 0 in   Single Leg Stand 8 seconds      Nutrition:   Nutrition Discharge:     Nutrition Assessments - 03/25/17 1504      MEDFICTS Scores   Pre Score 50   Post Score 66   Score Difference 16      Education Questionnaire Score:     Knowledge Questionnaire Score - 03/25/17 1504      Knowledge Questionnaire Score   Pre Score 21/28   Post Score 22/24      Goals reviewed with patient;  copy given to patient.

## 2017-03-25 NOTE — Progress Notes (Signed)
Daily Session Note  Patient Details  Name: MARISABEL MACPHERSON MRN: [id number] Date of Birth: 1935/02/25 Referring Provider:     [place] from 12/23/2016 in [place]  Referring Provider  Dr. Oval Linsey      Encounter Date: 03/25/2017  Check In:     Session Check In - 03/25/17 0952      Check-In   Location [facility]   Staff Present Suzanne Boron, BS, EP, Exercise Physiologist;Ashe Gago Wynetta Emery, RN, BSN   Supervising physician immediately available to respond to emergencies See telemetry face sheet for immediately available MD   Medication changes reported     No   Fall or balance concerns reported    No   Tobacco Cessation No Change   Warm-up and Cool-down Performed as group-led instruction   Resistance Training Performed Yes   VAD Patient? No     Pain Assessment   Currently in Pain? No/denies   Pain Score 0-No pain   Multiple Pain Sites No      Capillary Blood Glucose: No results found for this or any previous visit (from the past 24 hour(s)).    History  Smoking Status  . Former Smoker  . Packs/day: 1.00  . Years: 15.00  . Types: Cigarettes  . Quit date: 09/28/1975  Smokeless Tobacco  . Never Used    Goals Met:  Independence with exercise equipment Exercise tolerated well No report of cardiac concerns or symptoms Strength training completed today  Goals Unmet:  Not Applicable  Comments: Check out 1030.   Dr. [name] Sable is Medical Director for [facility] Cardiac and Pulmonary Rehab.

## 2017-03-25 NOTE — Progress Notes (Signed)
Subjective:     Patient ID: Catherine [name], female   DOB: 1935/08/05, 82 y.o.   MRN: [id number]  HPI Patient here to discuss medication. She's been for several years on diazepam at night for insomnia. She also has taken daytime dosage and we've tried to get her to scale this back. She just recently been taking half tablet each morning and 1 at night and some days has left off the daytime dose and has done fairly well. We discussed the fact that Diazepam is a long-acting benzodiazepine and also with age is metabolized differently. She denies any recent falls. She just finished phase I cardiac rehabilitation following her aortic valve surgery  Past Medical History:  Diagnosis Date  . Arthritis   . Asthma    last attack 02/2015  . Atrial fibrillation, chronic ([place])   . Cellulitis of left lower extremity 08/17/2016   Ulcer associated with severe venous insufficiency  . Chronic anticoagulation   . Chronic diastolic CHF (congestive heart failure) ([place])   . Chronic kidney disease    "RIGHT MANY KIDNEY INFECTIONS AND STONES"  . COPD (chronic obstructive pulmonary disease) ([place])   . Coronary artery disease   . Dizziness   . H/O: rheumatic fever   . Heart murmur   . Hypertension   . PONV (postoperative nausea and vomiting)    ' SOMETIMES', BUT NOT ALWAYS"  . S/P Maze operation for atrial fibrillation 10/14/2016   Complete bilateral atrial lesion set using cryothermy and bipolar radiofrequency ablation - atrial appendage was not treated due to previous surgical procedure (open mitral commissurotomy)  . S/P mitral valve replacement with bioprosthetic valve 10/14/2016   29 mm Med[employer]onic Mosaic porcine bioprosthetic tissue valve  . UTI (urinary tract infection)   . Valvular heart disease    Has mitral stenosis with prior mitral commissurotomy in 1970   Past Surgical History:  Procedure Laterality Date  . ABDOMINAL HYSTERECTOMY  1983   endometriosis  . APPENDECTOMY    . BACK SURGERY     neurosurgery x2  . CARDIAC CATHETERIZATION    . CARDIAC CATHETERIZATION N/A 08/03/2016   Procedure: Right/Left Heart Cath and Coronary Angiography;  Surgeon: Peter M Martinique, MD;  Location: [facility];  Service: Cardiovascular;  Laterality: N/A;  . cataract surg    . CHOLECYSTECTOMY N/A 05/30/2015   Procedure: LAPAROSCOPIC CHOLECYSTECTOMY WITH INTRAOPERATIVE CHOLANGIOGRAM;  Surgeon: Excell Seltzer, MD;  Location: [facility];  Service: General;  Laterality: N/A;  . COLONOSCOPY    . CORONARY ARTERY BYPASS GRAFT N/A 10/14/2016   Procedure: CORONARY ARTERY BYPASS GRAFTING (CABG);  Surgeon: Rexene Alberts, MD;  Location: [facility];  Service: Open Heart Surgery;  Laterality: N/A;  . EYE SURGERY    . MAZE N/A 10/14/2016   Procedure: MAZE;  Surgeon: Rexene Alberts, MD;  Location: [facility];  Service: Open Heart Surgery;  Laterality: N/A;  . MITRAL VALVE REPLACEMENT N/A 10/14/2016   Procedure: REDO MITRAL VALVE REPLACEMENT (MVR);  Surgeon: Rexene Alberts, MD;  Location: [facility];  Service: Open Heart Surgery;  Laterality: N/A;  . MITRAL VALVE SURGERY Left 1970   Open mitral commissurotomy via left thoracotomy approach  . TEE WITHOUT CARDIOVERSION N/A 07/05/2016   Procedure: TRANSESOPHAGEAL ECHOCARDIOGRAM (TEE);  Surgeon: [name], MD;  Location: [facility];  Service: Cardiovascular;  Laterality: N/A;  . TEE WITHOUT CARDIOVERSION N/A 10/14/2016   Procedure: TRANSESOPHAGEAL ECHOCARDIOGRAM (TEE);  Surgeon: Rexene Alberts, MD;  Location: [facility];  Service: Open Heart Surgery;  Laterality: N/A;    reports that she quit smoking about 41 years ago. Her smoking use included Cigarettes. She has a 15.00 pack-year smoking history. She has never used smokeless tobacco. She reports that she does not drink alcohol or use drugs. family history includes Leukemia in her father. Allergies  Allergen Reactions  . Aldactone [Spironolactone] Other (See Comments)    dyspnea  . Amoxicillin Palpitations    Tachycardia Has  patient had a PCN reaction causing immediate rash, facial/tongue/throat swelling, SOB or lightheadedness with hypotension: no Has patient had a PCN reaction causing severe rash involving mucus membranes or skin necrosis: {no Has patient had a PCN reaction that required hospitalization {no Has patient had a PCN reaction occurring within the last 10 years: {yes If all of the above answers are "NO", then may proceed with Cephalosporin use.  . Diltiazem Other (See Comments)    Causing headaches   . Flagyl [Metronidazole Hcl] Other (See Comments)    Causing headaches    . Flovent [Fluticasone Propionate] Other (See Comments)    Leg cramps  . Gabapentin Swelling  . Lyrica [Pregabalin] Swelling  . Quinidine Diarrhea and Other (See Comments)    Fever diarrhea  . Simvastatin Other (See Comments)    Leg pain, myalgia  . Tramadol Nausea Only  . Verapamil Other (See Comments)    myalgias  . Ace Inhibitors Other (See Comments)    unknown  . Benazepril Hcl Cough  . Ciprocin-Fluocin-Procin [Fluocinolone Acetonide] Other (See Comments)    unknown  . Ciprofloxacin Diarrhea  . Codeine Nausea Only  . Nitrofurantoin Monohyd Macro Nausea Only     Review of Systems  Constitutional: Positive for fatigue.  Eyes: Negative for visual disturbance.  Respiratory: Negative for cough, chest tightness, shortness of breath and wheezing.   Cardiovascular: Negative for chest pain, palpitations and leg swelling.  Neurological: Negative for dizziness, seizures, syncope, weakness, light-headedness and headaches.       Objective:   Physical Exam  Constitutional: She appears well-developed and well-nourished.  Eyes: Pupils are equal, round, and reactive to light.  Neck: Neck supple. No JVD present. No thyromegaly present.  Cardiovascular: Normal rate and regular rhythm.  Exam reveals no gallop.   Pulmonary/Chest: Effort normal and breath sounds normal. No respiratory distress. She has no wheezes. She has no  rales.  Musculoskeletal: She exhibits no edema.  Neurological: She is alert.       Assessment:     Chronic insomnia/anxiety.     Plan:     -We recommend she continue to taper her diazepam to one half tablet at night and eventually off if possible. -Recent labs obtained per cardiology reviewed and normal  [name] MD [facility]

## 2017-03-25 NOTE — Progress Notes (Signed)
Cardiac Individual Treatment Plan  Patient Details  Name: Catherine [name] MRN: [id number] Date of Birth: [birth year] Referring Provider:     CARDIAC REHAB PHASE II ORIENTATION from 12/23/2016 in [place]  Referring Provider  Dr. Oval Linsey      Initial Encounter Date:    CARDIAC REHAB PHASE II ORIENTATION from 12/23/2016 in [place]  Date  12/23/16  Referring Provider  Dr. Oval Linsey      Visit Diagnosis: Status post mitral valve replacement with bioprosthetic valve  S/P CABG x 1  Patient's Home Medications on Admission:  Current Outpatient Prescriptions:  .  acetaminophen (TYLENOL) 500 MG tablet, Take 500 mg by mouth every 6 (six) hours as needed for mild pain., Disp: , Rfl:  .  amLODipine (NORVASC) 5 MG tablet, Take 1 tablet (5 mg total) by mouth daily., Disp: 30 tablet, Rfl: 5 .  aspirin EC 81 MG EC tablet, Take 1 tablet (81 mg total) by mouth daily., Disp: , Rfl:  .  B Complex-C (B-COMPLEX WITH VITAMIN C) tablet, Take 1 tablet by mouth daily., Disp: , Rfl:  .  diazepam (VALIUM) 5 MG tablet, TAKE 1/2 TABLET EACH MORNING AND 1 AT BEDTIME AS NEEDED, Disp: 45 tablet, Rfl: 0 .  furosemide (LASIX) 40 MG tablet, Take 1 tablet by mouth daily. Ok to take an extra tablet as needed for swelling., Disp: 60 tablet, Rfl: 11 .  Multiple Vitamin (MULTIVITAMIN) tablet, Take 1 tablet by mouth daily.  , Disp: , Rfl:  .  OVER THE COUNTER MEDICATION, Apply 1 application topically at bedtime as needed (Leg Pain). Magni-Life topical cream for leg pain., Disp: , Rfl:  .  potassium chloride (K-DUR,KLOR-CON) 10 MEQ tablet, Take 1 tablet (10 mEq total) by mouth 2 (two) times daily. Take an extra 2 tablets on days you take extra Furosemide, Disp: 120 tablet, Rfl: 11 .  rosuvastatin (CRESTOR) 5 MG tablet, Take 1 tablet (5 mg total) by mouth daily at 6 PM., Disp: 90 tablet, Rfl: 0 .  SYMBICORT 80-4.5 MCG/ACT inhaler, 2 PUFFS 2 TIMES A DAY, Disp: 10.2 g, Rfl: 0 .   triamterene-hydrochlorothiazide (MAXZIDE-25) 37.5-25 MG tablet, Take 1 tablet by mouth daily., Disp: 90 tablet, Rfl: 2 .  warfarin (COUMADIN) 2 MG tablet, Take 1 tablet (2 mg total) by mouth daily at 6 PM. As directed by the coumadin clinic (Patient taking differently: Take 1-2 mg by mouth daily at 6 PM. As directed by the coumadin clinic,  Currently 1 tablet daily except 1/2 tablet on Monday - Wednesday - Friday.), Disp: 100 tablet, Rfl: 1  Past Medical History: Past Medical History:  Diagnosis Date  . Arthritis   . Asthma    last attack 02/2015  . Atrial fibrillation, chronic ([place])   . Cellulitis of left lower extremity 08/17/2016   Ulcer associated with severe venous insufficiency  . Chronic anticoagulation   . Chronic diastolic CHF (congestive heart failure) ([place])   . Chronic kidney disease    "RIGHT MANY KIDNEY INFECTIONS AND STONES"  . COPD (chronic obstructive pulmonary disease) ([place])   . Coronary artery disease   . Dizziness   . H/O: rheumatic fever   . Heart murmur   . Hypertension   . PONV (postoperative nausea and vomiting)    ' SOMETIMES', BUT NOT ALWAYS"  . S/P Maze operation for atrial fibrillation 10/14/2016   Complete bilateral atrial lesion set using cryothermy and bipolar radiofrequency ablation - atrial appendage was not treated due to previous  surgical procedure (open mitral commissurotomy)  . S/P mitral valve replacement with bioprosthetic valve 10/14/2016   29 mm Medtronic Mosaic porcine bioprosthetic tissue valve  . UTI (urinary tract infection)   . Valvular heart disease    Has mitral stenosis with prior mitral commissurotomy in 1970    Tobacco Use: History  Smoking Status  . Former Smoker  . Packs/day: 1.00  . Years: 15.00  . Types: Cigarettes  . Quit date: 09/28/1975  Smokeless Tobacco  . Never Used    Labs: Recent Review Flowsheet Data    Labs for ITP Cardiac and Pulmonary Rehab Latest Ref Rng & Units 10/15/2016 10/15/2016 10/15/2016 10/15/2016  12/20/2016   Cholestrol 0 - 200 mg/dL - - - - 158   LDLCALC 0 - 99 mg/dL - - - - 61   LDLDIRECT mg/dL - - - - -   HDL >39.00 mg/dL - - - - 60.00   Trlycerides 0.0 - 149.0 mg/dL - - - - 184.0(H)   Hemoglobin A1c 4.8 - 5.6 % - - - - -   PHART 7.350 - 7.450 7.344(L) 7.347(L) 7.330(L) - -   PCO2ART 32.0 - 48.0 mmHg 48.2(H) 44.8 48.7(H) - -   HCO3 20.0 - 28.0 mmol/L 26.0 24.4 25.5 - -   TCO2 0 - 100 mmol/L 27 26 27 25  -   ACIDBASEDEF 0.0 - 2.0 mmol/L - 1.0 - - -   O2SAT % 99.0 99.0 99.0 - -      Capillary Blood Glucose: Lab Results  Component Value Date   GLUCAP 138 (H) 10/16/2016   GLUCAP 117 (H) 10/16/2016   GLUCAP 108 (H) 10/16/2016   GLUCAP 107 (H) 10/16/2016   GLUCAP 106 (H) 10/16/2016     Exercise Target Goals:    Exercise Program Goal: Individual exercise prescription set with THRR, safety & activity barriers. Participant demonstrates ability to understand and report RPE using BORG scale, to self-measure pulse accurately, and to acknowledge the importance of the exercise prescription.  Exercise Prescription Goal: Starting with aerobic activity 30 plus minutes a day, 3 days per week for initial exercise prescription. Provide home exercise prescription and guidelines that participant acknowledges understanding prior to discharge.  Activity Barriers & Risk Stratification:   6 Minute Walk:     6 Minute Walk    Row Name 12/23/16 1108 03/25/17 1409       6 Minute Walk   Phase Initial Discharge    Distance 1200 feet 1400 feet    Distance % Change 0 % 16.67 %    Walk Time 6 minutes 6 minutes    # of Rest Breaks 0 0    MPH 2.27 2.65    METS 2.74 3.03    RPE 13 13    Perceived Dyspnea  13 14    VO2 Peak 7.01 10.71    Symptoms No No    Resting HR 53 bpm 71 bpm    Resting BP 120/56 138/82    Max Ex. HR 71 bpm 132 bpm    Max Ex. BP 150/66 154/80    2 Minute Post BP 126/60 140/78       Oxygen Initial Assessment:     Oxygen Initial Assessment - 12/23/16 1047       Home Oxygen   Home Oxygen Device None   Sleep Oxygen Prescription None   Home Exercise Oxygen Prescription None   Home at Rest Exercise Oxygen Prescription None     Initial 6 min Walk  Oxygen Used None     Program Oxygen Prescription   Program Oxygen Prescription None      Oxygen Re-Evaluation:   Oxygen Discharge (Final Oxygen Re-Evaluation):   Initial Exercise Prescription:     Initial Exercise Prescription - 12/23/16 1100      Date of Initial Exercise RX and Referring Provider   Date 12/23/16   Referring Provider Dr. Oval Linsey     Treadmill   MPH 2   Grade 0   Minutes 15   METs 2.5     NuStep   Level 2   SPM 17   Minutes 20   METs 1.9     Prescription Details   Frequency (times per week) 3   Duration Progress to 30 minutes of continuous aerobic without signs/symptoms of physical distress     Intensity   THRR 40-80% of Max Heartrate [phone]   Ratings of Perceived Exertion 11-13   Perceived Dyspnea 0-4     Progression   Progression Continue progressive overload as per policy without signs/symptoms or physical distress.     Resistance Training   Training Prescription Yes   Weight 1   Reps 10-15      Perform Capillary Blood Glucose checks as needed.  Exercise Prescription Changes:      Exercise Prescription Changes    Row Name 12/28/16 1300 01/17/17 1400 02/11/17 1400 03/02/17 1200 03/10/17 1500     Response to Exercise   Blood Pressure (Admit) 126/60 130/68 128/78 132/70 122/68   Blood Pressure (Exercise) 130/88 146/58 148/60 160/70 150/62   Blood Pressure (Exit) 110/60 130/60 122/70 122/50 130/60   Heart Rate (Admit) 50 bpm 46 bpm 49 bpm 66 bpm 47 bpm   Heart Rate (Exercise) 79 bpm 57 bpm 58 bpm 53 bpm 59 bpm   Heart Rate (Exit) 55 bpm 55 bpm 55 bpm 52 bpm 54 bpm   Rating of Perceived Exertion (Exercise) 9 11 11 13 13    Duration Progress to 30 minutes of  aerobic without signs/symptoms of physical distress Progress to 30 minutes of   aerobic without signs/symptoms of physical distress Progress to 30 minutes of  aerobic without signs/symptoms of physical distress Progress to 30 minutes of  aerobic without signs/symptoms of physical distress Progress to 30 minutes of  aerobic without signs/symptoms of physical distress   Intensity THRR unchanged THRR unchanged THRR unchanged THRR unchanged THRR unchanged     Progression   Progression Continue to progress workloads to maintain intensity without signs/symptoms of physical distress. Continue to progress workloads to maintain intensity without signs/symptoms of physical distress. Continue to progress workloads to maintain intensity without signs/symptoms of physical distress. Continue to progress workloads to maintain intensity without signs/symptoms of physical distress. Continue to progress workloads to maintain intensity without signs/symptoms of physical distress.     Resistance Training   Training Prescription Yes Yes Yes Yes Yes   Weight 1 2 2 2 2    Reps 10-15 10-15 10-15 10-15 10-15     Treadmill   MPH 2 2.2 2.3 2.3 2.3   Grade 0 0 0 0 0   Minutes 15 15 15 15 15    METs 2.5 2.7 2.8 2.8 2.8     NuStep   Level 2 3 3 4 4    SPM 14 18 25 18 23    Minutes 20 20 20 20 20    METs 3.44 3.45 3.45 3.45 3.45     Home Exercise Plan   Plans to continue exercise at Home (comment)  Home (comment) Home (comment) Home (comment) Home (comment)   Frequency Add 2 additional days to program exercise sessions. Add 2 additional days to program exercise sessions. Add 2 additional days to program exercise sessions. Add 2 additional days to program exercise sessions. Add 2 additional days to program exercise sessions.      Exercise Comments:      Exercise Comments    Row Name 12/28/16 1306 01/17/17 1413 02/11/17 1436 03/02/17 1231 03/10/17 1506   Exercise Comments Patient has just started CR and will be progressed in time. Watts before level increase. Patient is progressing very well in CR.   Patient is doing well in CR and is maintaining her levels Patient is doing well in CR.  Patient is doing well in CR and is maintaining her levels.       Exercise Goals and Review:      Exercise Goals    Row Name 12/23/16 1047             Exercise Goals   Increase Physical Activity Yes       Intervention Provide advice, education, support and counseling about physical activity/exercise needs.;Develop an individualized exercise prescription for aerobic and resistive training based on initial evaluation findings, risk stratification, comorbidities and participant's personal goals.       Expected Outcomes Achievement of increased cardiorespiratory fitness and enhanced flexibility, muscular endurance and strength shown through measurements of functional capacity and personal statement of participant.       Increase Strength and Stamina Yes       Intervention Provide advice, education, support and counseling about physical activity/exercise needs.;Develop an individualized exercise prescription for aerobic and resistive training based on initial evaluation findings, risk stratification, comorbidities and participant's personal goals.       Expected Outcomes Achievement of increased cardiorespiratory fitness and enhanced flexibility, muscular endurance and strength shown through measurements of functional capacity and personal statement of participant.          Exercise Goals Re-Evaluation :     Exercise Goals Re-Evaluation    Row Name 02/16/17 1426 03/10/17 1632 03/25/17 1510         Exercise Goal Re-Evaluation   Exercise Goals Review Increase Physical Activity;Increase Strenth and Stamina  More energy; do ADL's without fatige.  Increase Physical Activity;Increase Strenth and Stamina Increase Physical Activity;Increase Strenth and Stamina     Comments After completing 22 sessions, patient continues to progress with increased strength and stamina. She says has more energy and is able do  do more ADL's.  After completing 30 sessions, patient says she feels stronger. She continues to progress in her exercise program and is doing well. She says she is doing more at home and her husband has noticed she is doing more since starting the program.  Patient completed the program at 36 sessions with increased strength, stamina, and activity. She progressed well in the program and says she is able to do more around the house. Her exit walk test improved 16.67%. She says the program had gotten her back to norma.      Expected Outcomes Patient will complete the program with continued increased strength, stamina, and activity. Patient will complete the program with continued increased strength, stamina, and activity. Patient will continue exercising with continued increased strength, stamina, and activity.          Discharge Exercise Prescription (Final Exercise Prescription Changes):     Exercise Prescription Changes - 03/10/17 1500      Response to Exercise  Blood Pressure (Admit) 122/68   Blood Pressure (Exercise) 150/62   Blood Pressure (Exit) 130/60   Heart Rate (Admit) 47 bpm   Heart Rate (Exercise) 59 bpm   Heart Rate (Exit) 54 bpm   Rating of Perceived Exertion (Exercise) 13   Duration Progress to 30 minutes of  aerobic without signs/symptoms of physical distress   Intensity THRR unchanged     Progression   Progression Continue to progress workloads to maintain intensity without signs/symptoms of physical distress.     Resistance Training   Training Prescription Yes   Weight 2   Reps 10-15     Treadmill   MPH 2.3   Grade 0   Minutes 15   METs 2.8     NuStep   Level 4   SPM 23   Minutes 20   METs 3.45     Home Exercise Plan   Plans to continue exercise at Home (comment)   Frequency Add 2 additional days to program exercise sessions.      Nutrition:  Target Goals: Understanding of nutrition guidelines, daily intake of sodium <1532m, cholesterol <2051m  calories 30% from fat and 7% or less from saturated fats, daily to have 5 or more servings of fruits and vegetables.  Biometrics:     Pre Biometrics - 12/23/16 1111      Pre Biometrics   Height 5' 3"  (1.6 m)   Weight 152 lb 5.4 oz (69.1 kg)   Waist Circumference 35 inches   Hip Circumference 38 inches   Waist to Hip Ratio 0.92 %   BMI (Calculated) 27   Triceps Skinfold 18 mm   % Body Fat 37.3 %   Grip Strength 43 kg   Flexibility 0 in   Single Leg Stand 5 seconds         Post Biometrics - 03/25/17 1409       Post  Biometrics   Height 5' 3"  (1.6 m)   Weight 153 lb 4.9 oz (69.5 kg)   Waist Circumference 36 inches   Hip Circumference 39 inches   Waist to Hip Ratio 0.92 %   BMI (Calculated) 27.2   Triceps Skinfold 18 mm   % Body Fat 37.7 %   Grip Strength 48.13 kg   Flexibility 0 in   Single Leg Stand 8 seconds      Nutrition Therapy Plan and Nutrition Goals:   Nutrition Discharge: Rate Your Plate Scores:     Nutrition Assessments - 03/25/17 1504      MEDFICTS Scores   Pre Score 50   Post Score 66   Score Difference 16      Nutrition Goals Re-Evaluation:   Nutrition Goals Discharge (Final Nutrition Goals Re-Evaluation):   Psychosocial: Target Goals: Acknowledge presence or absence of significant depression and/or stress, maximize coping skills, provide positive support system. Participant is able to verbalize types and ability to use techniques and skills needed for reducing stress and depression.  Initial Review & Psychosocial Screening:     Initial Psych Review & Screening - 12/23/16 1119      Initial Review   Current issues with Current Depression;Current Sleep Concerns     Family Dynamics   Good Support System? Yes     Barriers   Psychosocial barriers to participate in program The patient should benefit from training in stress management and relaxation.;Psychosocial barriers identified (see note)  Patient's QOL score was 22.93 and her PHQ-9  score was 11 indicating some depression.  Screening Interventions   Interventions Encouraged to exercise  Patient says her depressed feeling started after her surgery and is related to her lack of energy to do anything. She feels she will improve when she feels more like doing things.       Quality of Life Scores:     Quality of Life - 03/25/17 1410      Quality of Life Scores   Health/Function Pre 20.3 %   Health/Function Post 24 %   Health/Function % Change 18.23 %   Socioeconomic Pre 25.5 %   Socioeconomic Post 25 %   Socioeconomic % Change  -1.96 %   Psych/Spiritual Pre 24.86 %   Psych/Spiritual Post 28.29 %   Psych/Spiritual % Change 13.8 %   Family Pre 24 %   Family Post 27.6 %   Family % Change 15 %   GLOBAL Pre 22.93 %   GLOBAL Post 25.64 %   GLOBAL % Change 11.82 %      PHQ-9: Recent Review Flowsheet Data    Depression screen [facility] 2/9 03/25/2017 12/23/2016 12/20/2016 11/21/2015 12/30/2014   Decreased Interest 0 0 0 0 0   Down, Depressed, Hopeless 0 2 1  0 0   PHQ - 2 Score 0 2 1 0 0   Altered sleeping 0 3 - - -   Tired, decreased energy 1 3 - - -   Change in appetite 0 0 - - -   Feeling bad or failure about yourself  0 2 - - -   Trouble concentrating 2 1 - - -   Moving slowly or fidgety/restless 0 0 - - -   Suicidal thoughts 0 0 - - -   PHQ-9 Score 3 11 - - -   Difficult doing work/chores - Somewhat difficult - - -     Interpretation of Total Score  Total Score Depression Severity:  1-4 = Minimal depression, 5-9 = Mild depression, 10-14 = Moderate depression, 15-19 = Moderately severe depression, 20-27 = Severe depression   Psychosocial Evaluation and Intervention:     Psychosocial Evaluation - 03/25/17 1506      Discharge Psychosocial Assessment & Intervention   Comments Patient's exit PHQ-9 score improved 11 to 3. Her QOL score improved 15%; Exit score 25.64. Patient has no psychosocial issues identified at discharge.       Psychosocial  Re-Evaluation:     Psychosocial Re-Evaluation    [place] Name 01/19/17 1356 02/16/17 1429 03/10/17 1634         Psychosocial Re-Evaluation   Current issues with None Identified Current Sleep Concerns Current Sleep Concerns     Comments  - Patient continues to want to sleep more during the day but has improved. She feels better on the days she comes to CR when she has to get up earlier. She feels more motivated on those days. Will continue to monitor. She continues to take diazepam for sleep and anxiety.  Patient continues to want to sleep to much but continues to improve with the program. Will continue to monitor.      Expected Outcomes Patient will have no psychosocial issues identified at discharge.  Patient will have improved PHQ-9 and QOL scores at discharge.  Patient will have improved QOL and PHQ-9 scores at [place].      Interventions Encouraged to attend Cardiac Rehabilitation for the exercise Relaxation education;Encouraged to attend Cardiac Rehabilitation for the exercise;Stress management education Relaxation education;Stress management education;Encouraged to attend Cardiac Rehabilitation for the exercise  Continue Psychosocial Services  No Follow up required Follow up required by staff Follow up required by staff        Psychosocial Discharge (Final Psychosocial Re-Evaluation):     Psychosocial Re-Evaluation - 03/10/17 1634      Psychosocial Re-Evaluation   Current issues with Current Sleep Concerns   Comments Patient continues to want to sleep to much but continues to improve with the program. Will continue to monitor.    Expected Outcomes Patient will have improved QOL and PHQ-9 scores at disharge.    Interventions Relaxation education;Stress management education;Encouraged to attend Cardiac Rehabilitation for the exercise   Continue Psychosocial Services  Follow up required by staff      Vocational Rehabilitation: Provide vocational rehab assistance to qualifying  candidates.   Vocational Rehab Evaluation & Intervention:     Vocational Rehab - 12/23/16 1046      Initial Vocational Rehab Evaluation & Intervention   Assessment shows need for Vocational Rehabilitation No      Education: Education Goals: Education classes will be provided on a weekly basis, covering required topics. Participant will state understanding/return demonstration of topics presented.  Learning Barriers/Preferences:     Learning Barriers/Preferences - 12/23/16 1045      Learning Barriers/Preferences   Learning Barriers None   Learning Preferences Skilled Demonstration;Audio;Individual Instruction;Verbal Instruction      Education Topics: Hypertension, Hypertension Reduction -Define heart disease and high blood pressure. Discus how high blood pressure affects the body and ways to reduce high blood pressure.   CARDIAC REHAB PHASE II EXERCISE from 03/23/2017 in [place]  Date  01/26/17  Educator  Russella Dar  Instruction Review Code  2- meets goals/outcomes      Exercise and Your Heart -Discuss why it is important to exercise, the FITT principles of exercise, normal and abnormal responses to exercise, and how to exercise safely.   CARDIAC REHAB PHASE II EXERCISE from 03/23/2017 in [place]  Date  02/02/17  Educator  DC  Instruction Review Code  2- meets goals/outcomes      Angina -Discuss definition of angina, causes of angina, treatment of angina, and how to decrease risk of having angina.   CARDIAC REHAB PHASE II EXERCISE from 03/23/2017 in [place]  Date  02/09/17  Educator  DC  Instruction Review Code  2- meets goals/outcomes      Cardiac Medications -Review what the following cardiac medications are used for, how they affect the body, and side effects that may occur when taking the medications.  Medications include Aspirin, Beta blockers, calcium channel blockers, ACE  Inhibitors, angiotensin receptor blockers, diuretics, digoxin, and antihyperlipidemics.   CARDIAC REHAB PHASE II EXERCISE from 03/23/2017 in [place]  Date  02/16/17  Educator  DJ  Instruction Review Code  2- meets goals/outcomes      Congestive Heart Failure -Discuss the definition of CHF, how to live with CHF, the signs and symptoms of CHF, and how keep track of weight and sodium intake.   CARDIAC REHAB PHASE II EXERCISE from 03/23/2017 in [place]  Date  02/23/17  Educator  DC  Instruction Review Code  2- meets goals/outcomes      Heart Disease and Intimacy -Discus the effect sexual activity has on the heart, how changes occur during intimacy as we age, and safety during sexual activity.   CARDIAC REHAB PHASE II EXERCISE from 03/23/2017 in [place]  Date  03/02/17  Educator  DJ  Instruction Review Code  2- meets goals/outcomes      Smoking Cessation / COPD -Discuss different methods to quit smoking, the health benefits of quitting smoking, and the definition of COPD.   CARDIAC REHAB PHASE II EXERCISE from 03/23/2017 in [place]  Date  03/09/17  Educator  Russella Dar  Instruction Review Code  2- meets goals/outcomes      Nutrition I: Fats -Discuss the types of cholesterol, what cholesterol does to the heart, and how cholesterol levels can be controlled.   CARDIAC REHAB PHASE II EXERCISE from 03/23/2017 in [place]  Date  03/16/17  Educator  DC  Instruction Review Code  2- meets goals/outcomes      Nutrition II: Labels -Discuss the different components of food labels and how to read food label   CARDIAC REHAB PHASE II EXERCISE from 03/23/2017 in [place]  Date  03/23/17  Educator  DC  Instruction Review Code  2- meets goals/outcomes      Heart Parts and Heart Disease -Discuss the anatomy of the heart, the pathway of blood  circulation through the heart, and these are affected by heart disease.   CARDIAC REHAB PHASE II EXERCISE from 03/23/2017 in [place]  Date  12/29/16  Educator  DJ  Instruction Review Code  2- meets goals/outcomes      Stress I: Signs and Symptoms -Discuss the causes of stress, how stress may lead to anxiety and depression, and ways to limit stress.   CARDIAC REHAB PHASE II EXERCISE from 03/23/2017 in [place]  Date  01/05/17  Educator  D. Coad  Instruction Review Code  2- meets goals/outcomes      Stress II: Relaxation -Discuss different types of relaxation techniques to limit stress.   CARDIAC REHAB PHASE II EXERCISE from 03/23/2017 in [place]  Date  01/12/17  Educator  DJ  Instruction Review Code  2- meets goals/outcomes      Warning Signs of Stroke / TIA -Discuss definition of a stroke, what the signs and symptoms are of a stroke, and how to identify when someone is having stroke.   CARDIAC REHAB PHASE II EXERCISE from 03/23/2017 in [place]  Date  01/19/17  Educator  Dc  Instruction Review Code  2- meets goals/outcomes      Knowledge Questionnaire Score:     Knowledge Questionnaire Score - 03/25/17 1504      Knowledge Questionnaire Score   Pre Score 21/28   Post Score 22/24      Core Components/Risk Factors/Patient Goals at Admission:     Personal Goals and Risk Factors at Admission - 12/23/16 1048      Core Components/Risk Factors/Patient Goals on Admission    Weight Management Yes   Intervention Weight Management: Develop a combined nutrition and exercise program designed to reach desired caloric intake, while maintaining appropriate intake of nutrient and fiber, sodium and fats, and appropriate energy expenditure required for the weight goal.;Weight Management: Provide education and appropriate resources to help participant work on and attain dietary  goals.;Weight Management/Obesity: Establish reasonable short term and long term weight goals.;Obesity: Provide education and appropriate resources to help participant work on and attain dietary goals.   Admit Weight 152 lb 6.4 oz (69.1 kg)   Goal Weight: Short Term 144 lb 6.4 oz (65.5 kg)   Goal Weight: Long Term 134 lb 6.4 oz (61 kg)   Expected  Outcomes Long Term: Adherence to nutrition and physical activity/exercise program aimed toward attainment of established weight goal;Short Term: Continue to assess and modify interventions until short term weight is achieved   Improve shortness of breath with ADL's Yes   Intervention Provide education, individualized exercise plan and daily activity instruction to help decrease symptoms of SOB with activities of daily living.   Expected Outcomes Short Term: Achieves a reduction of symptoms when performing activities of daily living.   Personal Goal Other Yes   Personal Goal Have more energy; increase strength and stamina; lose 10 lbs.    Intervention Patient will attend CR 3 days/week and supplement with exercise 2 days/week at home.    Expected Outcomes Patient will meet her personal goals.       Core Components/Risk Factors/Patient Goals Review:      Goals and Risk Factor Review    Row Name 12/23/16 1051 01/19/17 1354 02/16/17 1425 03/10/17 1629 03/25/17 1507     Core Components/Risk Factors/Patient Goals Review   Personal Goals Review Weight Management/Obesity;Improve shortness of breath with ADL's Weight Management/Obesity;Improve shortness of breath with ADL's Weight Management/Obesity;Improve shortness of breath with ADL's Weight Management/Obesity;Improve shortness of breath with ADL's  Lose 10 lbs; more energy. do ADL's without fatigue Weight Management/Obesity  Lose 10 lbs; increase energy, strength, and stamina; be able to do ADL's without fatigue.   Review  - Patient has completed 11 sessions gaining 0.6 lbs. She reports some improvement  in her SOB. Will continue to monitor. Patient has completed 22 sessions gaining 1.6 lbs. Her SOB has improved and she says she feels better overall. She is doing well in the program. Patient has completed 30 sessions gaining 1.6 lbs. She has done well in the program. She says he does have more energy and feel better overally. She is able to do more ADL's. Her b/p continues to be elvated. Her MD is aware making some medication adjustments recently.  Patient graduated at 36 sessions maintaining her weight. Her Medficts score did not improve at discharge. She did well in the program overall. She says she feels stronger and better overall and has more energy. Her balance and grip strength imroved. She plans to continue exercising at the [facility] and hopes to work toward losing 10 lbs.    Expected Outcomes  - Patient will complete the program meeting her personal goals.  Patient will complete the program meeting her personal goals.  Patient will complete the program and continue to exercise meeting her personal goals.  Patient will continue exercising and conitnue to work toward meeting her personal goals.       Core Components/Risk Factors/Patient Goals at Discharge (Final Review):      Goals and Risk Factor Review - 03/25/17 1507      Core Components/Risk Factors/Patient Goals Review   Personal Goals Review Weight Management/Obesity  Lose 10 lbs; increase energy, strength, and stamina; be able to do ADL's without fatigue.   Review Patient graduated at 36 sessions maintaining her weight. Her Medficts score did not improve at discharge. She did well in the program overall. She says she feels stronger and better overall and has more energy. Her balance and grip strength imroved. She plans to continue exercising at the [facility] and hopes to work toward losing 10 lbs.    Expected Outcomes Patient will continue exercising and conitnue to work toward meeting her personal goals.       ITP Comments:     ITP  Comments  [place] Name 12/29/16 1416 12/30/16 1537         ITP Comments Patient new to the program completing 3 sessions. Will continue to monitor for progress.  Patient met with Registered Dietitian to discuss nutrition topics including: Heart healthty eating, heart health cooking and make smart choices when shopping; Portion control; weight management; and hydration. Patient attended a group session with the hospital chaplian called [facility] to discuss and share how this recent diagnosis has effected their life         Comments: Patient graduated from [place] today on 03/25/17 after completing 36 sessions. She achieved LTG of 30 minutes of aerobic exercise at Max Met level of 3.45. All patients vitals are WNL. Patient has met with dietician. Discharge instruction has been reviewed in detail and patient stated an understanding of material given. Patient plans to continue exercising at the [facility]. Cardiac Rehab staff will make f/u calls at 1 month, 6 months, and 1 year. Patient had no complaints of any abnormal S/S or pain on their exit visit.

## 2017-03-28 ENCOUNTER — Encounter (HOSPITAL_COMMUNITY): Payer: Medicare Other

## 2017-04-04 ENCOUNTER — Ambulatory Visit (INDEPENDENT_AMBULATORY_CARE_PROVIDER_SITE_OTHER): Payer: Medicare Other | Admitting: Family Medicine

## 2017-04-04 ENCOUNTER — Ambulatory Visit (INDEPENDENT_AMBULATORY_CARE_PROVIDER_SITE_OTHER): Payer: Medicare Other | Admitting: General Practice

## 2017-04-04 ENCOUNTER — Other Ambulatory Visit: Payer: Self-pay | Admitting: Family Medicine

## 2017-04-04 ENCOUNTER — Encounter: Payer: Self-pay | Admitting: Family Medicine

## 2017-04-04 VITALS — BP 120/70 | HR 91 | Temp 98.3°F | Wt 153.1 lb

## 2017-04-04 DIAGNOSIS — R238 Other skin changes: Secondary | ICD-10-CM | POA: Diagnosis not present

## 2017-04-04 DIAGNOSIS — Z5181 Encounter for therapeutic drug level monitoring: Secondary | ICD-10-CM | POA: Diagnosis not present

## 2017-04-04 LAB — POCT INR: INR: 2.7

## 2017-04-04 MED ORDER — VALACYCLOVIR HCL 1 G PO TABS: 1000.0000 mg | ORAL_TABLET | Freq: Two times a day (BID) | ORAL | 0 refills | Status: DC

## 2017-04-04 NOTE — Progress Notes (Signed)
Subjective:     Patient ID: Catherine [name], female   DOB: Apr 18, 1935, 82 y.o.   MRN: [id number]  HPI Patient seen with some vaginal irritation with onset last Friday. No injury. She thought she may be developing a "boil". She's not any vaginal bleeding. No vaginal discharge. She used some warm compresses and hydrogen peroxide with temporary relief. No fevers or chills. She's had some mild lower abdominal bloating discomfort which is somewhat chronic and intermittent. No hx of similar problem.  No hx of STD.  Monogamous for many years and not sexually active for several years now.  Past Medical History:  Diagnosis Date  . Arthritis   . Asthma    last attack 02/2015  . Atrial fibrillation, chronic ([place])   . Cellulitis of left lower extremity 08/17/2016   Ulcer associated with severe venous insufficiency  . Chronic anticoagulation   . Chronic diastolic CHF (congestive heart failure) ([place])   . Chronic kidney disease    "RIGHT MANY KIDNEY INFECTIONS AND STONES"  . COPD (chronic obstructive pulmonary disease) ([place])   . Coronary artery disease   . Dizziness   . H/O: rheumatic fever   . Heart murmur   . Hypertension   . PONV (postoperative nausea and vomiting)    ' SOMETIMES', BUT NOT ALWAYS"  . S/P Maze operation for atrial fibrillation 10/14/2016   Complete bilateral atrial lesion set using cryothermy and bipolar radiofrequency ablation - atrial appendage was not treated due to previous surgical procedure (open mitral commissurotomy)  . S/P mitral valve replacement with bioprosthetic valve 10/14/2016   29 mm Medtronic Mosaic porcine bioprosthetic tissue valve  . UTI (urinary tract infection)   . Valvular heart disease    Has mitral stenosis with prior mitral commissurotomy in 1970   Past Surgical History:  Procedure Laterality Date  . ABDOMINAL HYSTERECTOMY  1983   endometriosis  . APPENDECTOMY    . BACK SURGERY     neurosurgery x2  . CARDIAC CATHETERIZATION    . CARDIAC  CATHETERIZATION N/A 08/03/2016   Procedure: Right/Left Heart Cath and Coronary Angiography;  Surgeon: Peter M Martinique, MD;  Location: [place] [facility];  Service: Cardiovascular;  Laterality: N/A;  . cataract surg    . CHOLECYSTECTOMY N/A 05/30/2015   Procedure: LAPAROSCOPIC CHOLECYSTECTOMY WITH INTRAOPERATIVE CHOLANGIOGRAM;  Surgeon: Excell Seltzer, MD;  Location: [place];  Service: General;  Laterality: N/A;  . COLONOSCOPY    . CORONARY ARTERY BYPASS GRAFT N/A 10/14/2016   Procedure: CORONARY ARTERY BYPASS GRAFTING (CABG);  Surgeon: Rexene Alberts, MD;  Location: [place];  Service: Open Heart Surgery;  Laterality: N/A;  . EYE SURGERY    . MAZE N/A 10/14/2016   Procedure: MAZE;  Surgeon: Rexene Alberts, MD;  Location: [place];  Service: Open Heart Surgery;  Laterality: N/A;  . MITRAL VALVE REPLACEMENT N/A 10/14/2016   Procedure: REDO MITRAL VALVE REPLACEMENT (MVR);  Surgeon: Rexene Alberts, MD;  Location: [place];  Service: Open Heart Surgery;  Laterality: N/A;  . MITRAL VALVE SURGERY Left 1970   Open mitral commissurotomy via left thoracotomy approach  . TEE WITHOUT CARDIOVERSION N/A 07/05/2016   Procedure: TRANSESOPHAGEAL ECHOCARDIOGRAM (TEE);  Surgeon: [name], MD;  Location: [place];  Service: Cardiovascular;  Laterality: N/A;  . TEE WITHOUT CARDIOVERSION N/A 10/14/2016   Procedure: TRANSESOPHAGEAL ECHOCARDIOGRAM (TEE);  Surgeon: Rexene Alberts, MD;  Location: [place];  Service: Open Heart Surgery;  Laterality: N/A;    reports that she quit smoking about  41 years ago. Her smoking use included Cigarettes. She has a 15.00 pack-year smoking history. She has never used smokeless tobacco. She reports that she does not drink alcohol or use drugs. family history includes Leukemia in her father. Allergies  Allergen Reactions  . Aldactone [Spironolactone] Other (See Comments)    dyspnea  . Amoxicillin Palpitations    Tachycardia Has patient had a PCN reaction causing immediate rash,  facial/tongue/throat swelling, SOB or lightheadedness with hypotension: no Has patient had a PCN reaction causing severe rash involving mucus membranes or skin necrosis: {no Has patient had a PCN reaction that required hospitalization {no Has patient had a PCN reaction occurring within the last 10 years: {yes If all of the above answers are "NO", then may proceed with Cephalosporin use.  . Diltiazem Other (See Comments)    Causing headaches   . Flagyl [Metronidazole Hcl] Other (See Comments)    Causing headaches    . Flovent [Fluticasone Propionate] Other (See Comments)    Leg cramps  . Gabapentin Swelling  . Lyrica [Pregabalin] Swelling  . Quinidine Diarrhea and Other (See Comments)    Fever diarrhea  . Simvastatin Other (See Comments)    Leg pain, myalgia  . Tramadol Nausea Only  . Verapamil Other (See Comments)    myalgias  . Ace Inhibitors Other (See Comments)    unknown  . Benazepril Hcl Cough  . Ciprocin-Fluocin-Procin [Fluocinolone Acetonide] Other (See Comments)    unknown  . Ciprofloxacin Diarrhea  . Codeine Nausea Only  . Nitrofurantoin Monohyd Macro Nausea Only     Review of Systems  Constitutional: Negative for chills and fever.  Genitourinary: Positive for vaginal pain. Negative for dysuria, hematuria, vaginal bleeding and vaginal discharge.       Objective:   Physical Exam  Constitutional: She appears well-developed and well-nourished.  Genitourinary:  Genitourinary Comments: Patient has what appears be a cluster of superficial vesicles left labia majora. No other abnormalities noted. No vaginal discharge.       Assessment:     Vesicular rash left labia majora. Patient reports no prior history of herpes but this would obviously be in differential. She appears be very low risk (for herpes or other STD).    Plan:     -Viral culture obtained -Start Valtrex 1 g twice a day for 10 days pending culture results -office follow up in 2 weeks to  reassess.  [name] MD [facility] at [facility]

## 2017-04-04 NOTE — Patient Instructions (Signed)
We will call regarding culture results Keep area clean with soap and water Follow up in 2 weeks to reassess.

## 2017-04-04 NOTE — Patient Instructions (Signed)
Pre visit review using our clinic review tool, if applicable. No additional management support is needed unless otherwise documented below in the visit note. 

## 2017-04-08 LAB — REFLEX ADENOVIRUS CULTURE

## 2017-04-08 LAB — RFX HSV/VARICELLA ZOSTER RAPID CULT

## 2017-04-12 ENCOUNTER — Ambulatory Visit (INDEPENDENT_AMBULATORY_CARE_PROVIDER_SITE_OTHER): Payer: Medicare Other | Admitting: Family Medicine

## 2017-04-12 ENCOUNTER — Ambulatory Visit (HOSPITAL_COMMUNITY)
Admission: RE | Admit: 2017-04-12 | Discharge: 2017-04-12 | Disposition: A | Discharge status: Home / Self Care | Payer: Medicare Other | Source: Ambulatory Visit | Attending: Cardiovascular Disease | Admitting: Cardiovascular Disease

## 2017-04-12 ENCOUNTER — Encounter: Payer: Self-pay | Admitting: Family Medicine

## 2017-04-12 VITALS — BP 126/69 | HR 69 | Temp 98.3°F | Ht 63.0 in | Wt 153.0 lb

## 2017-04-12 DIAGNOSIS — M7989 Other specified soft tissue disorders: Secondary | ICD-10-CM | POA: Diagnosis not present

## 2017-04-12 DIAGNOSIS — M79605 Pain in left leg: Secondary | ICD-10-CM | POA: Diagnosis not present

## 2017-04-12 LAB — VIRAL CULTURE VIRC

## 2017-04-12 LAB — CYTOMEGALOVIRUS CULTURE

## 2017-04-12 NOTE — Progress Notes (Signed)
   Subjective:    Patient ID: Catherine [name], female    DOB: October 20, 1934, 82 y.o.   MRN: [id number]  HPI Here for 3 days of swelling and pain in the left leg. She was seen on 04-04-17 for a vesicular rash in the left groin area that resembled a herpetic outbreak. However a viral culture was negative for this. She was started on Valtrex and she took this for 5 days. The rash has now resolved.  After her leg started swelling she thought it was related to the Valtrex so she stopped taking it. She always has some mild swelling in both ankles. She has never had swelling and pain in either leg like this befoie however. No chest pain or SOB. She is already on Coumadin for atrial fibrillation and her INR has been in target range.   Review of Systems  Constitutional: Negative.   Respiratory: Negative.   Cardiovascular: Positive for leg swelling. Negative for chest pain and palpitations.  Neurological: Negative.        Objective:   Physical Exam  Constitutional: She is oriented to person, place, and time. She appears well-developed and well-nourished.  Cardiovascular: Normal rate, regular rhythm and intact distal pulses.   Occasional ectopy. She has a 2/6 SM   Pulmonary/Chest: Effort normal and breath sounds normal. No respiratory distress. She has no wheezes. She has no rales.  Musculoskeletal:  Trace edema in the right ankle and lower leg. She has 3+ edema in the left lower leg and ankle. The left calf is quite tender. No cords are felt.   Neurological: She is alert and oriented to person, place, and time.          Assessment & Plan:  Possible DVT in the left leg. We will set her up for a venous doppler today and go from there. I am not sure if the leg swelling is related to the groin rash or the Valtrex, but this appears unlikely.  Alysia Penna, MD

## 2017-04-12 NOTE — Patient Instructions (Signed)
WE NOW OFFER   [facility] FAST TRACK!!!  SAME DAY Appointments for ACUTE CARE  Such as: Sprains, Injuries, cuts, abrasions, rashes, muscle pain, joint pain, back pain Colds, flu, sore throats, headache, allergies, cough, fever  Ear pain, sinus and eye infections Abdominal pain, nausea, vomiting, diarrhea, upset stomach Animal/insect bites  3 Easy Ways to Schedule: Walk-In Scheduling Call in scheduling Mychart Sign-up: [email or web address]         

## 2017-04-13 ENCOUNTER — Telehealth: Payer: Self-pay | Admitting: Family Medicine

## 2017-04-13 MED ORDER — METHYLPREDNISOLONE 4 MG PO TBPK: ORAL_TABLET | ORAL | 0 refills | Status: DC

## 2017-04-13 NOTE — Telephone Encounter (Signed)
Pt calling to see if her doppler results has come in because her foot is in a lot of pain and would like to see if she can get something for the pain.   Pharm:  [facility]

## 2017-04-13 NOTE — Telephone Encounter (Signed)
I spoke with pt and send script e-scribe to pharmacy. Dr. Sarajane Jews is aware of the drug allergy alert.

## 2017-04-13 NOTE — Telephone Encounter (Signed)
Please tell her the [name] was negative for a clot. It looks like she has phlebitis. Per her chart she cannot tolerate Tramadol or codeine. We cannot use NSAIDs since she is on Coumadin. Call in a Medrol dose pack.

## 2017-04-18 ENCOUNTER — Ambulatory Visit (HOSPITAL_COMMUNITY): Payer: Medicare Other | Admitting: Nurse Practitioner

## 2017-04-18 ENCOUNTER — Ambulatory Visit (INDEPENDENT_AMBULATORY_CARE_PROVIDER_SITE_OTHER): Payer: Medicare Other | Admitting: Family Medicine

## 2017-04-18 ENCOUNTER — Encounter: Payer: Self-pay | Admitting: Family Medicine

## 2017-04-18 ENCOUNTER — Ambulatory Visit: Payer: Self-pay | Admitting: Family Medicine

## 2017-04-18 VITALS — BP 110/70 | HR 60 | Temp 98.3°F | Wt 153.4 lb

## 2017-04-18 DIAGNOSIS — I809 Phlebitis and thrombophlebitis of unspecified site: Secondary | ICD-10-CM | POA: Diagnosis not present

## 2017-04-18 DIAGNOSIS — R21 Rash and other nonspecific skin eruption: Secondary | ICD-10-CM

## 2017-04-18 NOTE — Progress Notes (Signed)
Subjective:     Patient ID: Catherine [name], female   DOB: 1934-12-31, 82 y.o.   MRN: [id number]  HPI Patient was seen recently with what appeared to be drying up vesicular type rash left labia majora. No history of herpes and low risk. We did obtain viral culture though this(rash) was already drying up. This came back negative.  She was subsequently seen last week with some swelling left foot. Venous Doppler negative for DVT. Suspected phlebitis. Patient is on chronic Coumadin and compliant with therapy. No calf pain. No thigh pain. Easy bruising.  Past Medical History:  Diagnosis Date  . Arthritis   . Asthma    last attack 02/2015  . Atrial fibrillation, chronic ([place])   . Cellulitis of left lower extremity 08/17/2016   Ulcer associated with severe venous insufficiency  . Chronic anticoagulation   . Chronic diastolic CHF (congestive heart failure) ([place])   . Chronic kidney disease    "RIGHT MANY KIDNEY INFECTIONS AND STONES"  . COPD (chronic obstructive pulmonary disease) ([place])   . Coronary artery disease   . Dizziness   . H/O: rheumatic fever   . Heart murmur   . Hypertension   . PONV (postoperative nausea and vomiting)    ' SOMETIMES', BUT NOT ALWAYS"  . S/P Maze operation for atrial fibrillation 10/14/2016   Complete bilateral atrial lesion set using cryothermy and bipolar radiofrequency ablation - atrial appendage was not treated due to previous surgical procedure (open mitral commissurotomy)  . S/P mitral valve replacement with bioprosthetic valve 10/14/2016   29 mm Medtronic Mosaic porcine bioprosthetic tissue valve  . UTI (urinary tract infection)   . Valvular heart disease    Has mitral stenosis with prior mitral commissurotomy in 1970   Past Surgical History:  Procedure Laterality Date  . ABDOMINAL HYSTERECTOMY  1983   endometriosis  . APPENDECTOMY    . BACK SURGERY     neurosurgery x2  . CARDIAC CATHETERIZATION    . CARDIAC CATHETERIZATION N/A 08/03/2016   Procedure:  Right/Left Heart Cath and Coronary Angiography;  Surgeon: Peter M Martinique, MD;  Location: [place] [facility];  Service: Cardiovascular;  Laterality: N/A;  . cataract surg    . CHOLECYSTECTOMY N/A 05/30/2015   Procedure: LAPAROSCOPIC CHOLECYSTECTOMY WITH INTRAOPERATIVE CHOLANGIOGRAM;  Surgeon: Excell Seltzer, MD;  Location: [place];  Service: General;  Laterality: N/A;  . COLONOSCOPY    . CORONARY ARTERY BYPASS GRAFT N/A 10/14/2016   Procedure: CORONARY ARTERY BYPASS GRAFTING (CABG);  Surgeon: Rexene Alberts, MD;  Location: [place];  Service: Open Heart Surgery;  Laterality: N/A;  . EYE SURGERY    . MAZE N/A 10/14/2016   Procedure: MAZE;  Surgeon: Rexene Alberts, MD;  Location: [place];  Service: Open Heart Surgery;  Laterality: N/A;  . MITRAL VALVE REPLACEMENT N/A 10/14/2016   Procedure: REDO MITRAL VALVE REPLACEMENT (MVR);  Surgeon: Rexene Alberts, MD;  Location: [place];  Service: Open Heart Surgery;  Laterality: N/A;  . MITRAL VALVE SURGERY Left 1970   Open mitral commissurotomy via left thoracotomy approach  . TEE WITHOUT CARDIOVERSION N/A 07/05/2016   Procedure: TRANSESOPHAGEAL ECHOCARDIOGRAM (TEE);  Surgeon: [name], MD;  Location: [place];  Service: Cardiovascular;  Laterality: N/A;  . TEE WITHOUT CARDIOVERSION N/A 10/14/2016   Procedure: TRANSESOPHAGEAL ECHOCARDIOGRAM (TEE);  Surgeon: Rexene Alberts, MD;  Location: [place];  Service: Open Heart Surgery;  Laterality: N/A;    reports that she quit smoking about 41 years ago. Her smoking  use included Cigarettes. She has a 15.00 pack-year smoking history. She has never used smokeless tobacco. She reports that she does not drink alcohol or use drugs. family history includes Leukemia in her father. Allergies  Allergen Reactions  . Aldactone [Spironolactone] Other (See Comments)    dyspnea  . Amoxicillin Palpitations    Tachycardia Has patient had a PCN reaction causing immediate rash, facial/tongue/throat swelling, SOB or  lightheadedness with hypotension: no Has patient had a PCN reaction causing severe rash involving mucus membranes or skin necrosis: {no Has patient had a PCN reaction that required hospitalization {no Has patient had a PCN reaction occurring within the last 10 years: {yes If all of the above answers are "NO", then may proceed with Cephalosporin use.  . Diltiazem Other (See Comments)    Causing headaches   . Flagyl [Metronidazole Hcl] Other (See Comments)    Causing headaches    . Flovent [Fluticasone Propionate] Other (See Comments)    Leg cramps  . Gabapentin Swelling  . Lyrica [Pregabalin] Swelling  . Quinidine Diarrhea and Other (See Comments)    Fever diarrhea  . Simvastatin Other (See Comments)    Leg pain, myalgia  . Tramadol Nausea Only  . Verapamil Other (See Comments)    myalgias  . Ace Inhibitors Other (See Comments)    unknown  . Benazepril Hcl Cough  . Ciprocin-Fluocin-Procin [Fluocinolone Acetonide] Other (See Comments)    unknown  . Ciprofloxacin Diarrhea  . Codeine Nausea Only  . Nitrofurantoin Monohyd Macro Nausea Only     Review of Systems  Constitutional: Negative for chills and fever.  Respiratory: Negative for shortness of breath.        Objective:   Physical Exam  Constitutional: She appears well-developed and well-nourished.  Cardiovascular: Normal rate.   Pulmonary/Chest: Effort normal and breath sounds normal. No respiratory distress. She has no wheezes. She has no rales.  Musculoskeletal: She exhibits no edema.  Skin:  Left foot she has area of mild erythema and slight warmth and tenderness on the dorsum of the foot. No red streaking. Calf is nontender. Foot is warm to touch with good distal pulses       Assessment:     #1 probable localized phlebitis left foot.  #2 recent rash left labia which appear to be resolving vesicular-type rash. Herpes culture was negative. This is healed over at this time    Plan:     -Continue some topical  heat to left foot. -Follow-up promptly for any progressive erythema, pain, or swelling -Follow-up promptly for any recurrent skin rash labial region  [name] MD [facility] at [facility]

## 2017-04-19 ENCOUNTER — Ambulatory Visit: Payer: Self-pay | Admitting: Family Medicine

## 2017-04-25 NOTE — Progress Notes (Signed)
Cardiology Office Note   Date:  04/26/2017   ID:  Catherine [name], DOB [birth year], MRN [id number]  PCP:  [name], MD  Cardiologist:   [name], MD  Cardiothoracic surgeon: Dr. Roxy Manns  Chief Complaint  Patient presents with  . Follow-up    SOb swelling in feet     History of Present Illness: Catherine [name] is a 82 y.o. 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 Catherine [name] was previously a patient of [name]. She underwent mitral commissurotomy in the 1970s. On her echocardiogram 02/2014 she had an ejection fraction of 60-65% with moderate to severe mitral regurgitation. There was also mild aortic regurgitation. Her follow-up echo 02/2015 showed an EF of 60-65% with moderate to severe mitral regurgitation, moderate tricuspid regurgitation and elevated pulmonary artery pressures.  Repeat echo 9/11/7 revealed moderate pulmonary stenosis with moderate to severe mitral regurgitation.  She did not have any pulmonary hypertension and her LV was not dilated.  She was referred for left and right heart catheterization. She was found to have severe mitral stenosis with a 13 mmHg mean gradient, MVA 0.99 cm^2. She also had 50% LCx and 80% RCA lesions.  She had a 29 mm bioprosthetic mitral valve, single vessel CABG and MAZE with Dr. Roxy Manns on 10/14/16.  She was referred for a baseline echo 02/15/17 that revealed LVEF 60-65% with mild aortic stenosis (mean gradient 12 mmHg).  Her bioprosthetic mitral valve was functioning well.    At her last appointent Catherine [name]'s heart rate was very slow.  It appeared that she was in sinus bradycardia at 50 bpm with a long first degree AV block and a junctional escape.  Nebivolol ws discontinued.  She initially felt much better. However in the last month she has started to feel poorly again. She is short  of breath and gets tired with household chores such as making the bed. She denies lower extremity edema, orthopnea, or PND.  Since her last appointment she has noted vaginal blisters that she now thinks were shingles. She also developed left lower extremity edema.  She had a lower extremity Doppler that was negative for DVT.  She was diagnosed with phlebitis and has been taking prednisone.  She continues ot go to the [facility] for exercise.  She has not been going for the last two weeks because of the phlebitis.   Past Medical History:  Diagnosis Date  . Arthritis   . Asthma    last attack 02/2015  . Atrial fibrillation, chronic ([place])   . Cellulitis of left lower extremity 08/17/2016   Ulcer associated with severe venous insufficiency  . Chronic anticoagulation   . Chronic diastolic CHF (congestive heart failure) ([place])   . Chronic kidney disease    "RIGHT MANY KIDNEY INFECTIONS AND STONES"  . COPD (chronic obstructive pulmonary disease) ([place])   . Coronary artery disease   . Dizziness   . H/O: rheumatic fever   . Heart murmur   . Hypertension   . PONV (postoperative nausea and vomiting)    ' SOMETIMES', BUT NOT ALWAYS"  . S/P Maze operation for atrial fibrillation 10/14/2016   Complete bilateral atrial lesion set using cryothermy and bipolar radiofrequency ablation - atrial appendage was not treated due to previous surgical procedure (open mitral commissurotomy)  . S/P mitral valve replacement with bioprosthetic valve 10/14/2016   29 mm  Medtronic Mosaic porcine bioprosthetic tissue valve  . UTI (urinary tract infection)   . Valvular heart disease    Has mitral stenosis with prior mitral commissurotomy in 1970    Past Surgical History:  Procedure Laterality Date  . ABDOMINAL HYSTERECTOMY  1983   endometriosis  . APPENDECTOMY    . BACK SURGERY     neurosurgery x2  . CARDIAC CATHETERIZATION    . CARDIAC CATHETERIZATION N/A 08/03/2016   Procedure: Right/Left Heart Cath and Coronary  Angiography;  Surgeon: Peter M Martinique, MD;  Location: [place];  Service: Cardiovascular;  Laterality: N/A;  . cataract surg    . CHOLECYSTECTOMY N/A 05/30/2015   Procedure: LAPAROSCOPIC CHOLECYSTECTOMY WITH INTRAOPERATIVE CHOLANGIOGRAM;  Surgeon: Excell Seltzer, MD;  Location: [place];  Service: General;  Laterality: N/A;  . COLONOSCOPY    . CORONARY ARTERY BYPASS GRAFT N/A 10/14/2016   Procedure: CORONARY ARTERY BYPASS GRAFTING (CABG);  Surgeon: Rexene Alberts, MD;  Location: [place];  Service: Open Heart Surgery;  Laterality: N/A;  . EYE SURGERY    . MAZE N/A 10/14/2016   Procedure: MAZE;  Surgeon: Rexene Alberts, MD;  Location: [place];  Service: Open Heart Surgery;  Laterality: N/A;  . MITRAL VALVE REPLACEMENT N/A 10/14/2016   Procedure: REDO MITRAL VALVE REPLACEMENT (MVR);  Surgeon: Rexene Alberts, MD;  Location: [place];  Service: Open Heart Surgery;  Laterality: N/A;  . MITRAL VALVE SURGERY Left 1970   Open mitral commissurotomy via left thoracotomy approach  . TEE WITHOUT CARDIOVERSION N/A 07/05/2016   Procedure: TRANSESOPHAGEAL ECHOCARDIOGRAM (TEE);  Surgeon: [name], MD;  Location: [place];  Service: Cardiovascular;  Laterality: N/A;  . TEE WITHOUT CARDIOVERSION N/A 10/14/2016   Procedure: TRANSESOPHAGEAL ECHOCARDIOGRAM (TEE);  Surgeon: Rexene Alberts, MD;  Location: [place];  Service: Open Heart Surgery;  Laterality: N/A;    Current Outpatient Prescriptions  Medication Sig Dispense Refill  . acetaminophen (TYLENOL) 500 MG tablet Take 500 mg by mouth every 6 (six) hours as needed for mild pain.    Marland Kitchen amLODipine (NORVASC) 5 MG tablet Take 1 tablet (5 mg total) by mouth daily. 30 tablet 5  . aspirin EC 81 MG EC tablet Take 1 tablet (81 mg total) by mouth daily.    . B Complex-C (B-COMPLEX WITH VITAMIN C) tablet Take 1 tablet by mouth daily.    . diazepam (VALIUM) 5 MG tablet TAKE 1/2 TABLET EACH MORNING AND 1 AT BEDTIME AS NEEDED 45 tablet 0  . furosemide (LASIX) 40  MG tablet Take 1 tablet by mouth daily. Ok to take an extra tablet as needed for swelling. 60 tablet 11  . Multiple Vitamin (MULTIVITAMIN) tablet Take 1 tablet by mouth daily.      Marland Kitchen OVER THE COUNTER MEDICATION Apply 1 application topically at bedtime as needed (Leg Pain). Magni-Life topical cream for leg pain.    . potassium chloride (K-DUR,KLOR-CON) 10 MEQ tablet Take 1 tablet (10 mEq total) by mouth 2 (two) times daily. Take an extra 2 tablets on days you take extra Furosemide 120 tablet 11  . rosuvastatin (CRESTOR) 5 MG tablet Take 1 tablet (5 mg total) by mouth daily at 6 PM. 90 tablet 0  . SYMBICORT 80-4.5 MCG/ACT inhaler 2 PUFFS 2 TIMES A DAY 10.2 g 0  . triamterene-hydrochlorothiazide (MAXZIDE-25) 37.5-25 MG tablet Take 1 tablet by mouth daily. 90 tablet 2  . warfarin (COUMADIN) 2 MG tablet Take 1 tablet (2 mg total) by mouth daily at 6  PM. As directed by the coumadin clinic (Patient taking differently: Take 1-2 mg by mouth daily at 6 PM. As directed by the coumadin clinic,  Currently 1 tablet daily except 1/2 tablet on Monday - Wednesday - Friday.) 100 tablet 1   No current facility-administered medications for this visit.     Allergies:   Aldactone [spironolactone]; Amoxicillin; Diltiazem; Flagyl [metronidazole hcl]; Flovent [fluticasone propionate]; Gabapentin; Lyrica [pregabalin]; Quinidine; Simvastatin; Tramadol; Verapamil; Ace inhibitors; Benazepril hcl; Ciprocin-fluocin-procin [fluocinolone acetonide]; Ciprofloxacin; Codeine; and Nitrofurantoin monohyd macro    Social History:  The patient  reports that she quit smoking about 41 years ago. Her smoking use included Cigarettes. She has a 15.00 pack-year smoking history. She has never used smokeless tobacco. She reports that she does not drink alcohol or use drugs.   Family History:  The patient's family history includes Leukemia in her father.    ROS:  Please see the history of present illness.   Otherwise, review of systems are  positive for chest wall soreness.   All other systems are reviewed and negative.    PHYSICAL EXAM: VS:  BP 110/70 (BP Location: Left Arm, Patient Position: Sitting, Cuff Size: Normal)   Pulse 69   Ht 5\' 3"  (1.6 m)   Wt 68.9 kg (152 lb)   BMI 26.93 kg/m  , BMI Body mass index is 26.93 kg/m. GENERAL:  Well appearing.  No acute distress HEENT:  Pupils equal round and reactive, fundi not visualized, oral mucosa unremarkable NECK:  No jugular venous distention, waveform within normal limits, carotid upstroke brisk and symmetric, no bruits LUNGS:  Clear to auscultation bilaterally.  No crackles, rhonchi or wheezes HEART:  Irregularly irregular.  PMI not displaced or sustained,S1 and S2 within normal limits, no S3, no S4, no clicks, no rubs, II/VI systolic murmur throughout Chest: Healing midline incision.  Serous drainage from midline incision.  No erythema ABD:  Flat, positive bowel sounds normal in frequency in pitch, no bruits, no rebound, no guarding, no midline pulsatile mass, no hepatomegaly, no splenomegaly EXT:  2 plus pulses throughout, 2+ pitting edema to the no cyanosis no clubbing SKIN:  No rashes no nodules NEURO:  Cranial nerves II through XII grossly intact, motor grossly intact throughout PSYCH:  Cognitively intact, oriented to person place and time   EKG:  EKG is ordered today. The ekg ordered 05/21/16 demonstrates atrial fibrillateion.  Prior septal infarct.  Rate 60 bpm.  Unchanged from 10/22/15. 10/27/16: Sinus rhythm.  Rate 60 bpm.  First degree heart block. 02/01/17: Sinus bradycardia with sinus arrhythmia and first degree block.   03/08/17: Sinus bradycardia with sinus arrhythmia.   First degree heart block.  Junctional escape.  QTc 497 ms.   04/26/17: Atrial fibrillation.  Rate 69 bpm.  Echo 02/15/17: Study Conclusions  - Left ventricle: The cavity size was normal. There was mild   concentric hypertrophy. Systolic function was normal. The   estimated ejection fraction  was in the range of 60% to 65%. Wall   motion was normal; there were no regional wall motion   abnormalities. - Aortic valve: There was very mild stenosis. There was no   regurgitation. Peak velocity (S): 232 cm/s. Mean gradient (S): 12   mm Hg. - Mitral valve: A 67mm Medtronic Mosaic bioprosthesis was present   and functioning properly. There was trivial regurgitation. - Left atrium: The atrium was severely dilated. - Right ventricle: The cavity size was mildly dilated. Wall   thickness was normal. Systolic function was normal. -  Tricuspid valve: There was moderate regurgitation. - Pulmonary arteries: Systolic pressure was mildly increased. PA   peak pressure: 41 mm Hg (S).  Echo 06/07/16:  Study Conclusions  - Left ventricle: The cavity size was normal. Wall thickness was   normal. Systolic function was vigorous. The estimated ejection   fraction was in the range of 65% to 70%. - Mitral valve: MV is thickened. with some restricted motion Peak   and mean gradients through the valve are 22 and 8 mm Hg   respectively MVA by Pt1/2 is 1.9 cm2. Calcified annulus. Mildly   thickened leaflets . There was moderate to severe regurgitation. - Left atrium: The atrium was massively dilated. - Right atrium: The atrium was mildly dilated.   LHC/RHC 08/03/16: The left ventricular systolic function is normal.  LV end diastolic pressure is normal.  The left ventricular ejection fraction is 55-65% by visual estimate.  There is no aortic valve stenosis.  There is severe mitral valve stenosis.  Prox Cx to Mid Cx lesion, 50 %stenosed.  Mid RCA-2 lesion, 80 %stenosed.  Mid RCA-1 lesion, 80 %stenosed.  Hemodynamic findings consistent with mild pulmonary hypertension.  LV end diastolic pressure is normal.   1. Coronary artery disease   - 50% mid LCx   - 80% sequential lesions in the mid RCA 2. Normal LV function 3. Severe mitral stenosis. MV gradient of 13 mm Hg. MVA 0.99 cm squared  with index 0.57. 4. Moderate to severe mitral insufficiency 5. Mild pulmonary HTN. 6. Normal LV EDP 7. No significant AV gradient 8. Occluded right brachial artery.  Carotid Dopplers 10/12/16: 1-39% bilateral ICA stenosis  Recent Labs: 10/17/2016: Magnesium 2.0 12/20/2016: ALT 17 02/01/2017: BUN 15; Creatinine, Ser 0.90; Hemoglobin 12.9; Platelets 230.0; Potassium 3.5; Sodium 143; TSH 4.09    Lipid Panel    Component Value Date/Time   CHOL 158 12/20/2016 1112   TRIG 184.0 (H) 12/20/2016 1112   HDL 60.00 12/20/2016 1112   CHOLHDL 3 12/20/2016 1112   VLDL 36.8 12/20/2016 1112   LDLCALC 61 12/20/2016 1112   LDLDIRECT 134.7 08/28/2014 1009      Wt Readings from Last 3 Encounters:  04/26/17 68.9 kg (152 lb)  04/18/17 69.6 kg (153 lb 6.4 oz)  04/12/17 69.4 kg (153 lb)      ASSESSMENT AND PLAN:  # Rheumatic mitral valve disease: # s/p bioprosthetic mitral valve: No evidence of heart failure on exam.  Echo looks great.  Continue lasix.  # Paroxysmal atrial fibrillation:  # Fatigue: # Bradycardia: Catherine [name]'s rhythm is very difficult to assess.  For the most part it looks like atrial fibrillation.  She has a known, long first degree heart block when in sinus rhythm.  In leads V1 -V3 it appears that there are p waves the the R-R interval is the same at several places on her EKG.  Overall it is irregular and I don't see p waves throughout, making me think this is atrial fibrillaiton.  She has follow up in the atrial fibrillation clinic next week.  If they agree it is afib, she will benefit from rhythm control.  Heart rate is better off nebivolol.  Continue warfarin.   # Hypertension:  Blood pressure is well-controlled on amlodipine and HCTZ-triamterene.  Ideally she would not be on two diuretics, but her renal function has been stable.    # Hyperlipidemia: Continue rosuvastatin.  LDL 61on 11/2016.   Current medicines are reviewed at length with the patient today.  The patient  does  not have concerns regarding medicines.  The following changes have been made:  none  Labs/ tests ordered today include:   Orders Placed This Encounter  Procedures  . EKG 12-Lead    Disposition:   FU with Leyanna Bittman C. Oval Linsey, MD, [facility] in 2 months.   This note was written with the assistance of speech recognition software.  Please excuse any transcriptional errors.  Signed, Walther Sanagustin C. Oval Linsey, MD, [facility]  04/26/2017 6:09 PM    [place]

## 2017-04-26 ENCOUNTER — Encounter: Payer: Self-pay | Admitting: Cardiovascular Disease

## 2017-04-26 ENCOUNTER — Ambulatory Visit (INDEPENDENT_AMBULATORY_CARE_PROVIDER_SITE_OTHER): Payer: Medicare Other | Admitting: Cardiovascular Disease

## 2017-04-26 VITALS — BP 110/70 | HR 69 | Ht 63.0 in | Wt 152.0 lb

## 2017-04-26 DIAGNOSIS — Z953 Presence of xenogenic heart valve: Secondary | ICD-10-CM

## 2017-04-26 DIAGNOSIS — R0602 Shortness of breath: Secondary | ICD-10-CM | POA: Diagnosis not present

## 2017-04-26 DIAGNOSIS — Z9889 Other specified postprocedural states: Secondary | ICD-10-CM

## 2017-04-26 DIAGNOSIS — I10 Essential (primary) hypertension: Secondary | ICD-10-CM | POA: Diagnosis not present

## 2017-04-26 DIAGNOSIS — I480 Paroxysmal atrial fibrillation: Secondary | ICD-10-CM

## 2017-04-26 DIAGNOSIS — Z8679 Personal history of other diseases of the circulatory system: Secondary | ICD-10-CM

## 2017-04-26 NOTE — Patient Instructions (Signed)
Medication Instructions:  Your physician recommends that you continue on your current medications as directed. Please refer to the Current Medication list given to you today.  Labwork: NONE  Testing/Procedures: NONE  Follow-Up: Your physician recommends that you schedule a follow-up appointment in: 2 MONTH OV  If you need a refill on your cardiac medications before your next appointment, please call your pharmacy.  

## 2017-05-02 ENCOUNTER — Ambulatory Visit: Payer: Medicare Other

## 2017-05-03 ENCOUNTER — Ambulatory Visit (HOSPITAL_COMMUNITY)
Admission: RE | Admit: 2017-05-03 | Discharge: 2017-05-03 | Disposition: A | Discharge status: Home / Self Care | Payer: Medicare Other | Source: Ambulatory Visit | Attending: Nurse Practitioner | Admitting: Nurse Practitioner

## 2017-05-03 ENCOUNTER — Encounter (HOSPITAL_COMMUNITY): Payer: Self-pay | Admitting: Nurse Practitioner

## 2017-05-03 VITALS — BP 124/62 | HR 70 | Ht 63.0 in | Wt 154.0 lb

## 2017-05-03 DIAGNOSIS — Z9889 Other specified postprocedural states: Secondary | ICD-10-CM | POA: Diagnosis not present

## 2017-05-03 DIAGNOSIS — I482 Chronic atrial fibrillation: Secondary | ICD-10-CM | POA: Insufficient documentation

## 2017-05-03 DIAGNOSIS — Z953 Presence of xenogenic heart valve: Secondary | ICD-10-CM | POA: Insufficient documentation

## 2017-05-03 DIAGNOSIS — R600 Localized edema: Secondary | ICD-10-CM | POA: Insufficient documentation

## 2017-05-03 DIAGNOSIS — I5032 Chronic diastolic (congestive) heart failure: Secondary | ICD-10-CM | POA: Diagnosis not present

## 2017-05-03 DIAGNOSIS — Z951 Presence of aortocoronary bypass graft: Secondary | ICD-10-CM | POA: Diagnosis not present

## 2017-05-03 DIAGNOSIS — I2510 Atherosclerotic heart disease of native coronary artery without angina pectoris: Secondary | ICD-10-CM | POA: Insufficient documentation

## 2017-05-03 DIAGNOSIS — I130 Hypertensive heart and chronic kidney disease with heart failure and stage 1 through stage 4 chronic kidney disease, or unspecified chronic kidney disease: Secondary | ICD-10-CM | POA: Diagnosis not present

## 2017-05-03 DIAGNOSIS — Z7901 Long term (current) use of anticoagulants: Secondary | ICD-10-CM | POA: Diagnosis not present

## 2017-05-03 DIAGNOSIS — I4891 Unspecified atrial fibrillation: Secondary | ICD-10-CM | POA: Diagnosis present

## 2017-05-03 DIAGNOSIS — N189 Chronic kidney disease, unspecified: Secondary | ICD-10-CM | POA: Insufficient documentation

## 2017-05-03 DIAGNOSIS — Z7982 Long term (current) use of aspirin: Secondary | ICD-10-CM | POA: Insufficient documentation

## 2017-05-03 DIAGNOSIS — Z79899 Other long term (current) drug therapy: Secondary | ICD-10-CM | POA: Insufficient documentation

## 2017-05-03 DIAGNOSIS — Z87891 Personal history of nicotine dependence: Secondary | ICD-10-CM | POA: Diagnosis not present

## 2017-05-03 DIAGNOSIS — J449 Chronic obstructive pulmonary disease, unspecified: Secondary | ICD-10-CM | POA: Diagnosis not present

## 2017-05-03 DIAGNOSIS — Z8679 Personal history of other diseases of the circulatory system: Secondary | ICD-10-CM

## 2017-05-04 NOTE — Progress Notes (Signed)
Primary Care Physician: [name], MD Referring Physician: Dr. Roxy Manns Cardiologist: Dr. Alvira Philips Catherine [name] is a 82 y.o. 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 Pt states that she has not felt as well over the last few weeks. She is very concerned re the swelling of her rt leg and foot. This leg has always been slightly more swollen in the past but now can only wear a flip flop due to edema, which has occurred over the last several weeks. Her PCP obtained u/s and DVT was not found, thought it was phlebitis. Took prednisone with improvement of redness and improvement of some swelling, but edema persists.   Ekg reviewed with Dr. Rayann Heman and it is hard to tell if it shows controlled afib or SR with long PR interval. Pt had been in afib for years, tracked back to being present on ekg in 2012, and has a massive left atrium by echo. BB was stopped in June for HR in the 50's.Amlodipine was started in its place and questioning if it is contributing to LLE.Pt had been on amiodarone after surgery but stopped shortly after surgery due to nausea.   Today, she denies symptoms of palpitations, chest pain, shortness of breath, orthopnea, PND, dizziness, presyncope, syncope, or neurologic sequela. Positive for LLE, fatigue.The patient is tolerating medications without difficulties and is otherwise without complaint today.   Past Medical History:  Diagnosis Date  . Arthritis   . Asthma    last attack 02/2015  . Atrial fibrillation, chronic ([place])   . Cellulitis of left lower extremity 08/17/2016   Ulcer associated with severe venous insufficiency  . Chronic anticoagulation   . Chronic diastolic CHF (congestive heart failure) ([place])   . Chronic kidney disease    "RIGHT MANY KIDNEY INFECTIONS AND STONES"  . COPD (chronic obstructive pulmonary disease)  ([place])   . Coronary artery disease   . Dizziness   . H/O: rheumatic fever   . Heart murmur   . Hypertension   . PONV (postoperative nausea and vomiting)    ' SOMETIMES', BUT NOT ALWAYS"  . S/P Maze operation for atrial fibrillation 10/14/2016   Complete bilateral atrial lesion set using cryothermy and bipolar radiofrequency ablation - atrial appendage was not treated due to previous surgical procedure (open mitral commissurotomy)  . S/P mitral valve replacement with bioprosthetic valve 10/14/2016   29 mm Medtronic Mosaic porcine bioprosthetic tissue valve  . UTI (urinary tract infection)   . Valvular heart disease    Has mitral stenosis with prior mitral commissurotomy in 1970   Past Surgical History:  Procedure Laterality Date  . ABDOMINAL HYSTERECTOMY  1983   endometriosis  . APPENDECTOMY    . BACK SURGERY     neurosurgery x2  . CARDIAC CATHETERIZATION    . CARDIAC CATHETERIZATION N/A 08/03/2016   Procedure: Right/Left Heart Cath and Coronary Angiography;  Surgeon: Peter M Martinique, MD;  Location: [facility];  Service: Cardiovascular;  Laterality: N/A;  . cataract surg    . CHOLECYSTECTOMY N/A 05/30/2015   Procedure: LAPAROSCOPIC CHOLECYSTECTOMY WITH INTRAOPERATIVE CHOLANGIOGRAM;  Surgeon: Excell Seltzer, MD;  Location: [facility];  Service: General;  Laterality: N/A;  . COLONOSCOPY    . CORONARY ARTERY BYPASS GRAFT N/A 10/14/2016   Procedure: CORONARY ARTERY BYPASS GRAFTING (CABG);  Surgeon: Rexene Alberts, MD;  Location: [facility];  Service: Open  Heart Surgery;  Laterality: N/A;  . EYE SURGERY    . MAZE N/A 10/14/2016   Procedure: MAZE;  Surgeon: Rexene Alberts, MD;  Location: [facility];  Service: Open Heart Surgery;  Laterality: N/A;  . MITRAL VALVE REPLACEMENT N/A 10/14/2016   Procedure: REDO MITRAL VALVE REPLACEMENT (MVR);  Surgeon: Rexene Alberts, MD;  Location: [facility];  Service: Open Heart Surgery;  Laterality: N/A;  . MITRAL VALVE SURGERY Left 1970   Open mitral commissurotomy  via left thoracotomy approach  . TEE WITHOUT CARDIOVERSION N/A 07/05/2016   Procedure: TRANSESOPHAGEAL ECHOCARDIOGRAM (TEE);  Surgeon: [name], MD;  Location: [facility];  Service: Cardiovascular;  Laterality: N/A;  . TEE WITHOUT CARDIOVERSION N/A 10/14/2016   Procedure: TRANSESOPHAGEAL ECHOCARDIOGRAM (TEE);  Surgeon: Rexene Alberts, MD;  Location: [facility];  Service: Open Heart Surgery;  Laterality: N/A;    Current Outpatient Prescriptions  Medication Sig Dispense Refill  . acetaminophen (TYLENOL) 500 MG tablet Take 500 mg by mouth every 6 (six) hours as needed for mild pain.    Marland Kitchen amLODipine (NORVASC) 5 MG tablet Take 1 tablet (5 mg total) by mouth daily. 30 tablet 5  . aspirin EC 81 MG EC tablet Take 1 tablet (81 mg total) by mouth daily.    . B Complex-C (B-COMPLEX WITH VITAMIN C) tablet Take 1 tablet by mouth daily.    . diazepam (VALIUM) 5 MG tablet TAKE 1/2 TABLET EACH MORNING AND 1 AT BEDTIME AS NEEDED 45 tablet 0  . furosemide (LASIX) 40 MG tablet Take 1 tablet by mouth daily. Ok to take an extra tablet as needed for swelling. 60 tablet 11  . Multiple Vitamin (MULTIVITAMIN) tablet Take 1 tablet by mouth daily.      Marland Kitchen OVER THE COUNTER MEDICATION Apply 1 application topically at bedtime as needed (Leg Pain). Magni-Life topical cream for leg pain.    . potassium chloride (K-DUR,KLOR-CON) 10 MEQ tablet Take 1 tablet (10 mEq total) by mouth 2 (two) times daily. Take an extra 2 tablets on days you take extra Furosemide 120 tablet 11  . rosuvastatin (CRESTOR) 5 MG tablet Take 1 tablet (5 mg total) by mouth daily at 6 PM. 90 tablet 0  . SYMBICORT 80-4.5 MCG/ACT inhaler 2 PUFFS 2 TIMES A DAY 10.2 g 0  . triamterene-hydrochlorothiazide (MAXZIDE-25) 37.5-25 MG tablet Take 1 tablet by mouth daily. 90 tablet 2  . warfarin (COUMADIN) 2 MG tablet Take 1 tablet (2 mg total) by mouth daily at 6 PM. As directed by the coumadin clinic (Patient taking differently: Take 1-2 mg by mouth daily at 6 PM.  As directed by the coumadin clinic,  Currently 1 tablet daily except 1/2 tablet on Monday - Wednesday - Friday.) 100 tablet 1   No current facility-administered medications for this encounter.     Allergies  Allergen Reactions  . Aldactone [Spironolactone] Other (See Comments)    dyspnea  . Amoxicillin Palpitations    Tachycardia Has patient had a PCN reaction causing immediate rash, facial/tongue/throat swelling, SOB or lightheadedness with hypotension: no Has patient had a PCN reaction causing severe rash involving mucus membranes or skin necrosis: {no Has patient had a PCN reaction that required hospitalization {no Has patient had a PCN reaction occurring within the last 10 years: {yes If all of the above answers are "NO", then may proceed with Cephalosporin use.  . Diltiazem Other (See Comments)    Causing headaches   . Flagyl [Metronidazole Hcl] Other (See Comments)  Causing headaches    . Flovent [Fluticasone Propionate] Other (See Comments)    Leg cramps  . Gabapentin Swelling  . Lyrica [Pregabalin] Swelling  . Quinidine Diarrhea and Other (See Comments)    Fever diarrhea  . Simvastatin Other (See Comments)    Leg pain, myalgia  . Tramadol Nausea Only  . Verapamil Other (See Comments)    myalgias  . Ace Inhibitors Other (See Comments)    unknown  . Benazepril Hcl Cough  . Ciprocin-Fluocin-Procin [Fluocinolone Acetonide] Other (See Comments)    unknown  . Ciprofloxacin Diarrhea  . Codeine Nausea Only  . Nitrofurantoin Monohyd Macro Nausea Only    Social History   Social History  . Marital status: Married    Spouse name: N/A  . Number of children: N/A  . Years of education: N/A   Occupational History  . Not on file.   Social History Main Topics  . Smoking status: Former Smoker    Packs/day: 1.00    Years: 15.00    Types: Cigarettes    Quit date: 09/28/1975  . Smokeless tobacco: Never Used  . Alcohol use No  . Drug use: No  . Sexual activity: Yes    Other Topics Concern  . Not on file   Social History Narrative  . No narrative on file    Family History  Problem Relation Age of Onset  . Leukemia Father     ROS- All systems are reviewed and negative except as per the HPI above  Physical Exam: Vitals:   05/03/17 1330  BP: 124/62  Pulse: 70  Weight: 154 lb (69.9 kg)  Height: 5\' 3"  (1.6 m)   Wt Readings from Last 3 Encounters:  05/03/17 154 lb (69.9 kg)  04/26/17 152 lb (68.9 kg)  04/18/17 153 lb 6.4 oz (69.6 kg)    Labs: Lab Results  Component Value Date   NA 143 02/01/2017   K 3.5 02/01/2017   CL 102 02/01/2017   CO2 32 02/01/2017   GLUCOSE 94 02/01/2017   BUN 15 02/01/2017   CREATININE 0.90 02/01/2017   CALCIUM 9.8 02/01/2017   MG 2.0 10/17/2016   Lab Results  Component Value Date   INR 2.7 04/04/2017   Lab Results  Component Value Date   CHOL 158 12/20/2016   HDL 60.00 12/20/2016   LDLCALC 61 12/20/2016   TRIG 184.0 (H) 12/20/2016     GEN- The patient is well appearing, alert and oriented x 3 today.   Head- normocephalic, atraumatic Eyes-  Sclera clear, conjunctiva pink Ears- hearing intact Oropharynx- clear Neck- supple, no JVP Lymph- no cervical lymphadenopathy Lungs- Clear to ausculation bilaterally, normal work of breathing Heart- Regular rate and rhythm, no murmurs, rubs or gallops, PMI not laterally displaced GI- soft, NT, ND, + BS Extremities- no clubbing, cyanosis, or edema, trace on rt, 1+ on left MS- no significant deformity or atrophy Skin- no rash or lesion Psych- euthymic mood, full affect Neuro- strength and sensation are intact  EKG-afib at 70 bpm, vrs sinus with long first degree block. Epic records reviewed U/S-Impressions Duplex imaging of the left lower extremity deep and superficial veins reveals them to be easily compressible, without intraluminal thrombus or incompetence. Technologist Notes Thrombus Compressible Spontaneous Phasic Augmented Competent N =No Y  =Yes O =Absent + =Present -- =Variable or Decreased No evidence of left lower extremity deep or superficial venous thrombus or incompetence   Assessment and Plan: 1. Longstanding permanent afib, s/p MVR replacement and Maze procedure  10/14/16 Afib vrs SR with long PR int on EKG, reviewed with Dr.Allred He states that if she is in afib, doubt we could restore SR due to long standing chronic afib with massive left atrium, pt not tolerating amiodarone and long Qt interval of 505 ms, not favorable to tikosyn. He is not in favor of any changes today.  2. LLE Pt very concerned re swelling of left leg that is much more noticeable over the last few weeks U/S neg for venous insufficiency and DVT Has not improved with pt taking extra lasix for the last few days Question if amlodipine which was started in the last 6-8 weeks from be contributing Will message Dr.[place] re pt's concerns  Will see back in one year s/p maze

## 2017-05-10 ENCOUNTER — Telehealth: Payer: Self-pay | Admitting: Cardiovascular Disease

## 2017-05-10 MED ORDER — IRBESARTAN 150 MG PO TABS: 150.0000 mg | ORAL_TABLET | Freq: Every day | ORAL | 5 refills | Status: DC

## 2017-05-10 NOTE — Telephone Encounter (Signed)
I have called patient and relayed instruction. She verbalized understanding and thanks. Rx called to her preferred local pharmacy for pickup.

## 2017-05-10 NOTE — Telephone Encounter (Signed)
New message   Pt states that the medication she was on caused her feet and ankles to swell. She stopped taking the medication.  Pt c/o swelling: STAT is pt has developed SOB within 24 hours  1. How long have you been experiencing swelling? A couple weeks  2. Where is the swelling located? Feet and ankles  3.  Are you currently taking a "fluid pill"? Lasix   4.  Are you currently SOB? yes  5.  Have you traveled recently? No

## 2017-05-10 NOTE — Telephone Encounter (Signed)
Pt of Dr. Oval Linsey She reports she's been having swelling in her legs which she noted has resolved in the last few days. Explains that over the last 2 weeks she noted her legs were swelling "almost unbearably" to her knees. She states Roderic Palau NP made her aware the amlodipine might be contributing to her leg swelling but did not instruct her to discontinue Pt voiced that Friday it got so bad she felt like she needed to do something, so she stopped her amlodipine starting that day. Voiced that over weekend, swelling improved, and as of today, her leg swelling is resolved and she feels much better.  Pt asking for alternative med to amlodipine - notes she is aware she will need something for BP control but does not wish to take this medication. Notes she also had some undesirable SEs on bystolic, which she was on before being switched to Amlodipine.  Pt aware I'll route for MD review and advised her to check her BPs daily & keep journal.

## 2017-05-10 NOTE — Telephone Encounter (Signed)
I don't see any contraindication to ARB.  Have her try irbesartan 150 mg daily, continue BP log.  Has appointment with Dr. Oval Linsey in September.

## 2017-05-16 ENCOUNTER — Ambulatory Visit (INDEPENDENT_AMBULATORY_CARE_PROVIDER_SITE_OTHER): Payer: Medicare Other | Admitting: General Practice

## 2017-05-16 DIAGNOSIS — I4891 Unspecified atrial fibrillation: Secondary | ICD-10-CM

## 2017-05-16 LAB — POCT INR: INR: 3.3

## 2017-05-16 NOTE — Patient Instructions (Signed)
Pre visit review using our clinic review tool, if applicable. No additional management support is needed unless otherwise documented below in the visit note. 

## 2017-05-19 ENCOUNTER — Other Ambulatory Visit: Payer: Self-pay | Admitting: Family Medicine

## 2017-05-23 ENCOUNTER — Other Ambulatory Visit: Payer: Self-pay | Admitting: Cardiovascular Disease

## 2017-05-23 NOTE — Telephone Encounter (Signed)
Please review for refill. Thanks!  

## 2017-05-24 ENCOUNTER — Telehealth: Payer: Self-pay | Admitting: Cardiovascular Disease

## 2017-05-24 NOTE — Telephone Encounter (Signed)
New message   Pt c/o medication issue:  1. Name of Medication: irbesartan (AVAPRO) 150 MG tablet  2. How are you currently taking this medication (dosage and times per day)? 150MG   3. Are you having a reaction (difficulty breathing--STAT)? Unknown  4. What is your medication issue? Pt states that this is not working for her at all and wants to know about other options

## 2017-05-25 NOTE — Telephone Encounter (Signed)
Noted ADR to  ACEi Spironolactone Diltiazem Verapamil Benazpril Amlodipine - low extremity edema Bystolic - Bradycardia   Patient called to report "not feeling well" with Irbesartan. Very tired, increase sweating and unable to sleep at night.  She stopped takign irbesartan 4 days ago and reports feeling better now.  Patient also espressed desired of not taking any more BP medication.  Recommendation:  1. Okay to hold irbesartan for now 2. Continue all other medication including Maxzide and furosemide 3. Monitor BP daily and keep records to bring to next OV 4. Call HTN clinic if BP sustained above 903 systolic

## 2017-05-25 NOTE — Telephone Encounter (Signed)
Left detailed message-cb w/BP readings(DPR)

## 2017-05-25 NOTE — Telephone Encounter (Signed)
noted 

## 2017-06-06 ENCOUNTER — Ambulatory Visit (INDEPENDENT_AMBULATORY_CARE_PROVIDER_SITE_OTHER): Payer: Medicare Other | Admitting: General Practice

## 2017-06-06 DIAGNOSIS — D3132 Benign neoplasm of left choroid: Secondary | ICD-10-CM | POA: Diagnosis not present

## 2017-06-06 DIAGNOSIS — Z961 Presence of intraocular lens: Secondary | ICD-10-CM | POA: Diagnosis not present

## 2017-06-06 DIAGNOSIS — I4891 Unspecified atrial fibrillation: Secondary | ICD-10-CM | POA: Diagnosis not present

## 2017-06-06 DIAGNOSIS — H04123 Dry eye syndrome of bilateral lacrimal glands: Secondary | ICD-10-CM | POA: Diagnosis not present

## 2017-06-06 DIAGNOSIS — H43811 Vitreous degeneration, right eye: Secondary | ICD-10-CM | POA: Diagnosis not present

## 2017-06-06 DIAGNOSIS — H26493 Other secondary cataract, bilateral: Secondary | ICD-10-CM | POA: Diagnosis not present

## 2017-06-06 LAB — POCT INR: INR: 3.1

## 2017-06-06 NOTE — Patient Instructions (Signed)
Pre visit review using our clinic review tool, if applicable. No additional management support is needed unless otherwise documented below in the visit note. 

## 2017-06-15 ENCOUNTER — Ambulatory Visit (INDEPENDENT_AMBULATORY_CARE_PROVIDER_SITE_OTHER): Payer: Medicare Other | Admitting: Cardiovascular Disease

## 2017-06-15 ENCOUNTER — Encounter: Payer: Self-pay | Admitting: Cardiovascular Disease

## 2017-06-15 VITALS — BP 139/71 | HR 76 | Ht 63.0 in | Wt 152.0 lb

## 2017-06-15 DIAGNOSIS — I10 Essential (primary) hypertension: Secondary | ICD-10-CM

## 2017-06-15 DIAGNOSIS — I480 Paroxysmal atrial fibrillation: Secondary | ICD-10-CM | POA: Diagnosis not present

## 2017-06-15 DIAGNOSIS — R0602 Shortness of breath: Secondary | ICD-10-CM | POA: Diagnosis not present

## 2017-06-15 DIAGNOSIS — I050 Rheumatic mitral stenosis: Secondary | ICD-10-CM

## 2017-06-15 DIAGNOSIS — R5383 Other fatigue: Secondary | ICD-10-CM | POA: Diagnosis not present

## 2017-06-15 DIAGNOSIS — E7800 Pure hypercholesterolemia, unspecified: Secondary | ICD-10-CM

## 2017-06-15 DIAGNOSIS — R001 Bradycardia, unspecified: Secondary | ICD-10-CM | POA: Diagnosis not present

## 2017-06-15 DIAGNOSIS — R002 Palpitations: Secondary | ICD-10-CM

## 2017-06-15 NOTE — Patient Instructions (Addendum)
Medication Instructions:  Your physician recommends that you continue on your current medications as directed. Please refer to the Current Medication list given to you today.  Labwork: NONE  Testing/Procedures: Your physician has recommended that you wear an event monitor. Event monitors are medical devices that record the heart's electrical activity. Doctors most often [name] these monitors to diagnose arrhythmias. Arrhythmias are problems with the speed or rhythm of the heartbeat. The monitor is a small, portable device. You can wear one while you do your normal daily activities. This is usually used to diagnose what is causing palpitations/syncope (passing out). [address] 7 DAY EVENT   Follow-Up: Your physician recommends that you schedule a follow-up appointment in: ABOUT 1 MONTH   If you need a refill on your cardiac medications before your next appointment, please call your pharmacy.

## 2017-06-15 NOTE — Progress Notes (Signed)
Cardiology Office Note   Date:  06/18/2017   ID:  BRITTIANY WIEHE, DOB Feb 22, 1935, MRN [id number]  PCP:  [name], MD  Cardiologist:   [name], MD  Cardiothoracic surgeon: Dr. Roxy Manns  No chief complaint on file.    History of Present Illness: Catherine [name] is a 82 y.o. 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 Catherine [name] was previously a patient of [name]. She underwent mitral commissurotomy in the 1970s. On her echocardiogram 02/2014 she had an ejection fraction of 60-65% with moderate to severe mitral regurgitation. There was also mild aortic regurgitation. Her follow-up echo 02/2015 showed an EF of 60-65% with moderate to severe mitral regurgitation, moderate tricuspid regurgitation and elevated pulmonary artery pressures.  Repeat echo 9/11/7 revealed moderate pulmonary stenosis with moderate to severe mitral regurgitation.  She did not have any pulmonary hypertension and her LV was not dilated.  She was referred for left and right heart catheterization. She was found to have severe mitral stenosis with a 13 mmHg mean gradient, MVA 0.99 cm^2. She also had 50% LCx and 80% RCA lesions.  She had a 29 mm bioprosthetic mitral valve, single vessel CABG and MAZE with Dr. Roxy Manns on 10/14/16.  She was referred for a baseline echo 02/15/17 that revealed LVEF 60-65% with mild aortic stenosis (mean gradient 12 mmHg).  Her bioprosthetic mitral valve was functioning well.    At her last appointment Catherine [name] reported fatigue.  It was unclear whether she was in atrial fibrillation or sinus rhythm with a long PR interval.  She was referred to the atrial fibrillation clinic where this remained unclear.  However, it was felt that if she were in atrial fibrillation it would not be possible to maintain sinus rhythm.  His last appointment  she developed lower extremity edema. She stopped amlodipine and this improved. This was switched to irbesartan. She then developed diaphoresis and insomnia that she attributed to this medication.  Since her last appiontment she continues to complain of fatigue.  Most mornings she has no energy to get out of the bed.  She makes herself to go exercise class and this is when she feels her best.  She denies chest pain or shortness of breath.  She reports that she has been feeling depressed.  Her brother, sister-in-law and nephew are all in the hospital.  She has been stressed about this.  She denies lower extremity edema, orthopnea or PND.   Past Medical History:  Diagnosis Date  . Arthritis   . Asthma    last attack 02/2015  . Atrial fibrillation, chronic ([place])   . Cellulitis of left lower extremity 08/17/2016   Ulcer associated with severe venous insufficiency  . Chronic anticoagulation   . Chronic diastolic CHF (congestive heart failure) ([place])   . Chronic kidney disease    "RIGHT MANY KIDNEY INFECTIONS AND STONES"  . COPD (chronic obstructive pulmonary disease) ([place])   . Coronary artery disease   . Dizziness   . H/O: rheumatic fever   . Heart murmur   . Hypertension   . PONV (postoperative nausea and vomiting)    ' SOMETIMES', BUT NOT ALWAYS"  . S/P Maze operation for atrial fibrillation 10/14/2016   Complete bilateral atrial lesion set using cryothermy and bipolar radiofrequency ablation - atrial appendage was not treated due to previous surgical procedure (open mitral commissurotomy)  .  S/P mitral valve replacement with bioprosthetic valve 10/14/2016   29 mm Medtronic Mosaic porcine bioprosthetic tissue valve  . UTI (urinary tract infection)   . Valvular heart disease    Has mitral stenosis with prior mitral commissurotomy in 1970    Past Surgical History:  Procedure Laterality Date  . ABDOMINAL HYSTERECTOMY  1983   endometriosis  . APPENDECTOMY    . BACK SURGERY     neurosurgery  x2  . CARDIAC CATHETERIZATION    . CARDIAC CATHETERIZATION N/A 08/03/2016   Procedure: Right/Left Heart Cath and Coronary Angiography;  Surgeon: Peter M Martinique, MD;  Location: [facility];  Service: Cardiovascular;  Laterality: N/A;  . cataract surg    . CHOLECYSTECTOMY N/A 05/30/2015   Procedure: LAPAROSCOPIC CHOLECYSTECTOMY WITH INTRAOPERATIVE CHOLANGIOGRAM;  Surgeon: Excell Seltzer, MD;  Location: [facility];  Service: General;  Laterality: N/A;  . COLONOSCOPY    . CORONARY ARTERY BYPASS GRAFT N/A 10/14/2016   Procedure: CORONARY ARTERY BYPASS GRAFTING (CABG);  Surgeon: Rexene Alberts, MD;  Location: [facility];  Service: Open Heart Surgery;  Laterality: N/A;  . EYE SURGERY    . MAZE N/A 10/14/2016   Procedure: MAZE;  Surgeon: Rexene Alberts, MD;  Location: [facility];  Service: Open Heart Surgery;  Laterality: N/A;  . MITRAL VALVE REPLACEMENT N/A 10/14/2016   Procedure: REDO MITRAL VALVE REPLACEMENT (MVR);  Surgeon: Rexene Alberts, MD;  Location: [facility];  Service: Open Heart Surgery;  Laterality: N/A;  . MITRAL VALVE SURGERY Left 1970   Open mitral commissurotomy via left thoracotomy approach  . TEE WITHOUT CARDIOVERSION N/A 07/05/2016   Procedure: TRANSESOPHAGEAL ECHOCARDIOGRAM (TEE);  Surgeon: [name], MD;  Location: [facility];  Service: Cardiovascular;  Laterality: N/A;  . TEE WITHOUT CARDIOVERSION N/A 10/14/2016   Procedure: TRANSESOPHAGEAL ECHOCARDIOGRAM (TEE);  Surgeon: Rexene Alberts, MD;  Location: [facility];  Service: Open Heart Surgery;  Laterality: N/A;    Current Outpatient Prescriptions  Medication Sig Dispense Refill  . acetaminophen (TYLENOL) 500 MG tablet Take 500 mg by mouth every 6 (six) hours as needed for mild pain.    Marland Kitchen aspirin EC 81 MG EC tablet Take 1 tablet (81 mg total) by mouth daily.    . diazepam (VALIUM) 5 MG tablet TAKE 1/2 TABLET EACH MORNING AND 1 AT BEDTIME AS NEEDED 45 tablet 0  . furosemide (LASIX) 40 MG tablet Take 40 mg by mouth daily. Patient takes  1 tablet twice daily if needed for swelling    . OVER THE COUNTER MEDICATION Apply 1 application topically at bedtime as needed (Leg Pain). Magni-Life topical cream for leg pain.    . potassium chloride (K-DUR,KLOR-CON) 10 MEQ tablet Take 1 tablet (10 mEq total) by mouth 2 (two) times daily. Take an extra 2 tablets on days you take extra Furosemide 120 tablet 11  . rosuvastatin (CRESTOR) 5 MG tablet Take 1 tablet (5 mg total) by mouth daily at 6 PM. 90 tablet 0  . SYMBICORT 80-4.5 MCG/ACT inhaler 2 PUFFS 2 TIMES A DAY 10.2 g 1  . triamterene-hydrochlorothiazide (MAXZIDE-25) 37.5-25 MG tablet Take 1 tablet by mouth daily. 90 tablet 3  . warfarin (COUMADIN) 2 MG tablet Take 1 tablet (2 mg total) by mouth daily at 6 PM. As directed by the coumadin clinic (Patient taking differently: Take 1-2 mg by mouth daily at 6 PM. As directed by the coumadin clinic,  Currently 1 tablet daily except 1/2 tablet on Monday - Wednesday - Friday.) 100 tablet  1   No current facility-administered medications for this visit.     Allergies:   Aldactone [spironolactone]; Amoxicillin; Diltiazem; Flagyl [metronidazole hcl]; Flovent [fluticasone propionate]; Gabapentin; Lyrica [pregabalin]; Quinidine; Simvastatin; Tramadol; Verapamil; Amlodipine; Ace inhibitors; Benazepril hcl; Ciprocin-fluocin-procin [fluocinolone acetonide]; Ciprofloxacin; Codeine; and Nitrofurantoin monohyd macro    Social History:  The patient  reports that she quit smoking about 41 years ago. Her smoking use included Cigarettes. She has a 15.00 pack-year smoking history. She has never used smokeless tobacco. She reports that she does not drink alcohol or use drugs.   Family History:  The patient's family history includes Leukemia in her father.    ROS:  Please see the history of present illness.   Otherwise, review of systems are positive for chest wall soreness.   All other systems are reviewed and negative.    PHYSICAL EXAM: VS:  BP 139/71   Pulse  76   Ht 5\' 3"  (1.6 m)   Wt 68.9 kg (152 lb)   BMI 26.93 kg/m  , BMI Body mass index is 26.93 kg/m. GENERAL:  Well appearing.   HEENT:  Pupils equal round and reactive, fundi not visualized, oral mucosa unremarkable NECK:  No JVD.  Waveform within normal limits, carotid upstroke brisk and symmetric, no bruits LUNGS:  Clear to auscultation bilaterally.  No crackles, rhonchi or wheezes HEART:  Irregularly irregular.  PMI not displaced or sustained,S1 and S2 within normal limits, no S3, no S4, no clicks, no rubs, II/VI systolic murmur  Chest: Well-healed midline incision.  ABD:  Flat, positive bowel sounds normal in frequency in pitch, no bruits, no rebound, no guarding, no midline pulsatile mass, no hepatomegaly, no splenomegaly EXT:  2 plus pulses throughout, no edema. no cyanosis no clubbing SKIN:  No rashes no nodules.  Ecchymoses.  NEURO:  Cranial nerves II through XII grossly intact, motor grossly intact throughout PSYCH:  Cognitively intact, oriented to person place and time.  Depressed affect.   EKG:  EKG is not ordered today. The ekg ordered 05/21/16 demonstrates atrial fibrillateion.  Prior septal infarct.  Rate 60 bpm.  Unchanged from 10/22/15. 10/27/16: Sinus rhythm.  Rate 60 bpm.  First degree heart block. 02/01/17: Sinus bradycardia with sinus arrhythmia and first degree block.   03/08/17: Sinus bradycardia with sinus arrhythmia.   First degree heart block.  Junctional escape.  QTc 497 ms.   04/26/17: Atrial fibrillation.  Rate 69 bpm.  Echo 02/15/17: Study Conclusions  - Left ventricle: The cavity size was normal. There was mild   concentric hypertrophy. Systolic function was normal. The   estimated ejection fraction was in the range of 60% to 65%. Wall   motion was normal; there were no regional wall motion   abnormalities. - Aortic valve: There was very mild stenosis. There was no   regurgitation. Peak velocity (S): 232 cm/s. Mean gradient (S): 12   mm Hg. - Mitral valve: A  66mm Medtronic Mosaic bioprosthesis was present   and functioning properly. There was trivial regurgitation. - Left atrium: The atrium was severely dilated. - Right ventricle: The cavity size was mildly dilated. Wall   thickness was normal. Systolic function was normal. - Tricuspid valve: There was moderate regurgitation. - Pulmonary arteries: Systolic pressure was mildly increased. PA   peak pressure: 41 mm Hg (S).  Echo 06/07/16:  Study Conclusions  - Left ventricle: The cavity size was normal. Wall thickness was   normal. Systolic function was vigorous. The estimated ejection   fraction was in  the range of 65% to 70%. - Mitral valve: MV is thickened. with some restricted motion Peak   and mean gradients through the valve are 22 and 8 mm Hg   respectively MVA by Pt1/2 is 1.9 cm2. Calcified annulus. Mildly   thickened leaflets . There was moderate to severe regurgitation. - Left atrium: The atrium was massively dilated. - Right atrium: The atrium was mildly dilated.   LHC/RHC 08/03/16: The left ventricular systolic function is normal.  LV end diastolic pressure is normal.  The left ventricular ejection fraction is 55-65% by visual estimate.  There is no aortic valve stenosis.  There is severe mitral valve stenosis.  Prox Cx to Mid Cx lesion, 50 %stenosed.  Mid RCA-2 lesion, 80 %stenosed.  Mid RCA-1 lesion, 80 %stenosed.  Hemodynamic findings consistent with mild pulmonary hypertension.  LV end diastolic pressure is normal.   1. Coronary artery disease   - 50% mid LCx   - 80% sequential lesions in the mid RCA 2. Normal LV function 3. Severe mitral stenosis. MV gradient of 13 mm Hg. MVA 0.99 cm squared with index 0.57. 4. Moderate to severe mitral insufficiency 5. Mild pulmonary HTN. 6. Normal LV EDP 7. No significant AV gradient 8. Occluded right brachial artery.  Carotid Dopplers 10/12/16: 1-39% bilateral ICA stenosis  Recent Labs: 10/17/2016: Magnesium  2.0 12/20/2016: ALT 17 02/01/2017: BUN 15; Creatinine, Ser 0.90; Hemoglobin 12.9; Platelets 230.0; Potassium 3.5; Sodium 143; TSH 4.09    Lipid Panel    Component Value Date/Time   CHOL 158 12/20/2016 1112   TRIG 184.0 (H) 12/20/2016 1112   HDL 60.00 12/20/2016 1112   CHOLHDL 3 12/20/2016 1112   VLDL 36.8 12/20/2016 1112   LDLCALC 61 12/20/2016 1112   LDLDIRECT 134.7 08/28/2014 1009      Wt Readings from Last 3 Encounters:  06/15/17 68.9 kg (152 lb)  05/03/17 69.9 kg (154 lb)  04/26/17 68.9 kg (152 lb)      ASSESSMENT AND PLAN:  # Rheumatic mitral valve disease: # s/p bioprosthetic mitral valve: No evidence of heart failure on exam.  Echo looks great.  Continue lasix.  I don't think that her fatigue has anything to do with her heart.  She has no exertional symptoms.  We discussed the fact that this is most likely depression.  She will discuss with Dr. Elease Hashimoto.  # Persistent atrial fibrillation:  # Bradycardia: # Palpitations: Catherine [name]'s rhythm is very difficult to assess.  It seems that she is in persistent atrial fibrillation.  Options for restoring sinus rhythm are limited.  She is not a good ablation candidate and her LA is quite large.  Continue warfarin.  Rate is well-controlled.  Heart rate is better off nebivolol.  We will get a 7 day event monitor.  # Hypertension:  Blood pressure is well-controlled on HCTZ-triamterene.  Ideally she would not be on two diuretics, but her renal function has been stable.  Check BMP at follow up.  She had edema on amlodipine.   # Hyperlipidemia: Continue rosuvastatin.  LDL 61on 11/2016.   Current medicines are reviewed at length with the patient today.  The patient does not have concerns regarding medicines.  The following changes have been made:  none  Labs/ tests ordered today include:   Orders Placed This Encounter  Procedures  . Cardiac event monitor    Disposition:   FU with Sofhia Ulibarri C. Oval Linsey, MD, [facility] in 1  months.   This note was written with the assistance  of speech recognition software.  Please excuse any transcriptional errors.  Signed, Alexsia Klindt C. Oval Linsey, MD, [facility]  06/18/2017 11:57 AM    [place]

## 2017-06-28 ENCOUNTER — Ambulatory Visit (INDEPENDENT_AMBULATORY_CARE_PROVIDER_SITE_OTHER): Payer: Medicare Other

## 2017-06-28 DIAGNOSIS — R002 Palpitations: Secondary | ICD-10-CM

## 2017-07-04 ENCOUNTER — Ambulatory Visit (INDEPENDENT_AMBULATORY_CARE_PROVIDER_SITE_OTHER): Payer: Medicare Other | Admitting: General Practice

## 2017-07-04 DIAGNOSIS — I4891 Unspecified atrial fibrillation: Secondary | ICD-10-CM

## 2017-07-04 LAB — POCT INR: INR: 2.8

## 2017-07-04 NOTE — Patient Instructions (Signed)
Pre visit review using our clinic review tool, if applicable. No additional management support is needed unless otherwise documented below in the visit note. 

## 2017-07-19 DIAGNOSIS — D225 Melanocytic nevi of trunk: Secondary | ICD-10-CM | POA: Diagnosis not present

## 2017-07-19 DIAGNOSIS — X32XXXD Exposure to sunlight, subsequent encounter: Secondary | ICD-10-CM | POA: Diagnosis not present

## 2017-07-19 DIAGNOSIS — B078 Other viral warts: Secondary | ICD-10-CM | POA: Diagnosis not present

## 2017-07-19 DIAGNOSIS — C44711 Basal cell carcinoma of skin of unspecified lower limb, including hip: Secondary | ICD-10-CM | POA: Diagnosis not present

## 2017-07-19 DIAGNOSIS — L570 Actinic keratosis: Secondary | ICD-10-CM | POA: Diagnosis not present

## 2017-07-19 DIAGNOSIS — Z1283 Encounter for screening for malignant neoplasm of skin: Secondary | ICD-10-CM | POA: Diagnosis not present

## 2017-07-19 DIAGNOSIS — C44712 Basal cell carcinoma of skin of right lower limb, including hip: Secondary | ICD-10-CM | POA: Diagnosis not present

## 2017-07-25 NOTE — Progress Notes (Signed)
Cardiology Office Note   Date:  07/26/2017   ID:  AL GAGEN, DOB 1935/08/07, MRN [id number]  PCP:  [name], MD  Cardiologist:   [name], MD  Cardiothoracic surgeon: Dr. Roxy Manns  Chief Complaint  Patient presents with  . Follow-up    1 months;     History of Present Illness: Catherine [name] is a 82 y.o. 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 Catherine [name] was previously a patient of [name]. She underwent mitral commissurotomy in the 1970s. On her echocardiogram 02/2014 she had an ejection fraction of 60-65% with moderate to severe mitral regurgitation. There was also mild aortic regurgitation. Her follow-up echo 02/2015 showed an EF of 60-65% with moderate to severe mitral regurgitation, moderate tricuspid regurgitation and elevated pulmonary artery pressures.  Repeat echo 9/11/7 revealed moderate pulmonary stenosis with moderate to severe mitral regurgitation.  She did not have any pulmonary hypertension and her LV was not dilated.  She was referred for left and right heart catheterization. She was found to have severe mitral stenosis with a 13 mmHg mean gradient, MVA 0.99 cm^2. She also had 50% LCx and 80% RCA lesions.  She had a 29 mm bioprosthetic mitral valve, single vessel CABG and MAZE with Dr. Roxy Manns on 10/14/16.  She was referred for a baseline echo 02/15/17 that revealed LVEF 60-65% with mild aortic stenosis (mean gradient 12 mmHg).  Her bioprosthetic mitral valve was functioning well.    After her surgery Catherine [name] continued to report fatigue.  It was unclear whether she was in atrial fibrillation or sinus rhythm with a long PR interval.  She was referred to the atrial fibrillation clinic where this remained unclear.  However, it was felt that if she were in atrial fibrillation it would not be possible to  maintain sinus rhythm.  His last appointment she developed lower extremity edema. She stopped amlodipine and this improved. This was switched to irbesartan. She then developed diaphoresis and insomnia that she attributed to this medication.  Catherine [name] has been doing better since her last appointment.  She continues to exercise three times per week at the [facility].  She has met several very good friends and her mood has been much better.  For the last week she has been struggling with an upper respiratory infection productive of yellow sputum.  She has some shortness of breath but her Symbicort inhaler helps.  She still has experienced several coughing attacks and has difficulty sleeping at night.  She denies fever or chills.  She checks her blood pressure regularly and it has been ranging from the 130s-150s over 60s-80s.  She is frustrated that she cannot seem to lose weight despite exercising regularly.  She does note that she stopped at [employer] for breakfast after each workout.  Lately she has been taking Lasix once daily and has no lower extremity edema or orthopnea.   Past Medical History:  Diagnosis Date  . Aortic stenosis, mild 07/26/2017  . Arthritis   . Asthma    last attack 02/2015  . Atrial fibrillation, chronic ([place])   . Cellulitis of left lower extremity 08/17/2016   Ulcer associated with severe venous insufficiency  . Chronic anticoagulation   . Chronic diastolic CHF (congestive heart failure) ([place])   . Chronic kidney disease    "RIGHT MANY KIDNEY INFECTIONS AND STONES"  . COPD (  chronic obstructive pulmonary disease) ([place])   . Coronary artery disease   . Dizziness   . H/O: rheumatic fever   . Heart murmur   . Hypertension   . PONV (postoperative nausea and vomiting)    ' SOMETIMES', BUT NOT ALWAYS"  . S/P Maze operation for atrial fibrillation 10/14/2016   Complete bilateral atrial lesion set using cryothermy and bipolar radiofrequency ablation - atrial appendage was not treated due  to previous surgical procedure (open mitral commissurotomy)  . S/P mitral valve replacement with bioprosthetic valve 10/14/2016   29 mm Medtronic Mosaic porcine bioprosthetic tissue valve  . UTI (urinary tract infection)   . Valvular heart disease    Has mitral stenosis with prior mitral commissurotomy in 1970    Past Surgical History:  Procedure Laterality Date  . ABDOMINAL HYSTERECTOMY  1983   endometriosis  . APPENDECTOMY    . BACK SURGERY     neurosurgery x2  . CARDIAC CATHETERIZATION    . CARDIAC CATHETERIZATION N/A 08/03/2016   Procedure: Right/Left Heart Cath and Coronary Angiography;  Surgeon: Peter M Martinique, MD;  Location: [facility];  Service: Cardiovascular;  Laterality: N/A;  . cataract surg    . CHOLECYSTECTOMY N/A 05/30/2015   Procedure: LAPAROSCOPIC CHOLECYSTECTOMY WITH INTRAOPERATIVE CHOLANGIOGRAM;  Surgeon: Excell Seltzer, MD;  Location: [facility];  Service: General;  Laterality: N/A;  . COLONOSCOPY    . CORONARY ARTERY BYPASS GRAFT N/A 10/14/2016   Procedure: CORONARY ARTERY BYPASS GRAFTING (CABG);  Surgeon: Rexene Alberts, MD;  Location: [facility];  Service: Open Heart Surgery;  Laterality: N/A;  . EYE SURGERY    . MAZE N/A 10/14/2016   Procedure: MAZE;  Surgeon: Rexene Alberts, MD;  Location: [facility];  Service: Open Heart Surgery;  Laterality: N/A;  . MITRAL VALVE REPLACEMENT N/A 10/14/2016   Procedure: REDO MITRAL VALVE REPLACEMENT (MVR);  Surgeon: Rexene Alberts, MD;  Location: [place];  Service: Open Heart Surgery;  Laterality: N/A;  . MITRAL VALVE SURGERY Left 1970   Open mitral commissurotomy via left thoracotomy approach  . TEE WITHOUT CARDIOVERSION N/A 07/05/2016   Procedure: TRANSESOPHAGEAL ECHOCARDIOGRAM (TEE);  Surgeon: [name], MD;  Location: [place];  Service: Cardiovascular;  Laterality: N/A;  . TEE WITHOUT CARDIOVERSION N/A 10/14/2016   Procedure: TRANSESOPHAGEAL ECHOCARDIOGRAM (TEE);  Surgeon: Rexene Alberts, MD;  Location: [place];  Service:  Open Heart Surgery;  Laterality: N/A;    Current Outpatient Prescriptions  Medication Sig Dispense Refill  . acetaminophen (TYLENOL) 500 MG tablet Take 500 mg by mouth every 6 (six) hours as needed for mild pain.    Marland Kitchen aspirin EC 81 MG EC tablet Take 1 tablet (81 mg total) by mouth daily.    . diazepam (VALIUM) 5 MG tablet TAKE 1/2 TABLET EACH MORNING AND 1 AT BEDTIME AS NEEDED 45 tablet 0  . furosemide (LASIX) 40 MG tablet Take 40 mg by mouth daily. Patient takes 1 tablet twice daily if needed for swelling    . OVER THE COUNTER MEDICATION Apply 1 application topically at bedtime as needed (Leg Pain). Magni-Life topical cream for leg pain.    . potassium chloride (K-DUR,KLOR-CON) 10 MEQ tablet Take 1 tablet (10 mEq total) by mouth 2 (two) times daily. Take an extra 2 tablets on days you take extra Furosemide 120 tablet 11  . rosuvastatin (CRESTOR) 5 MG tablet Take 1 tablet (5 mg total) by mouth daily at 6 PM. 90 tablet 0  . SYMBICORT 80-4.5 MCG/ACT inhaler  2 PUFFS 2 TIMES A DAY 10.2 g 1  . triamterene-hydrochlorothiazide (MAXZIDE-25) 37.5-25 MG tablet Take 1 tablet by mouth daily. 90 tablet 3  . warfarin (COUMADIN) 2 MG tablet Take 1 tablet (2 mg total) by mouth daily at 6 PM. As directed by the coumadin clinic (Patient taking differently: Take 1-2 mg by mouth daily at 6 PM. As directed by the coumadin clinic,  Currently 1 tablet daily except 1/2 tablet on Monday - Wednesday - Friday.) 100 tablet 1  . albuterol (PROVENTIL HFA;VENTOLIN HFA) 108 (90 Base) MCG/ACT inhaler Inhale 2 puffs into the lungs every 6 (six) hours as needed for wheezing or shortness of breath. 1 Inhaler 0  . losartan (COZAAR) 25 MG tablet Take 1 tablet (25 mg total) by mouth daily. 30 tablet 5   No current facility-administered medications for this visit.     Allergies:   Aldactone [spironolactone]; Amoxicillin; Diltiazem; Flagyl [metronidazole hcl]; Flovent [fluticasone propionate]; Gabapentin; Lyrica [pregabalin];  Quinidine; Simvastatin; Tramadol; Verapamil; Amlodipine; Ace inhibitors; Benazepril hcl; Ciprocin-fluocin-procin [fluocinolone acetonide]; Ciprofloxacin; Codeine; and Nitrofurantoin monohyd macro    Social History:  The patient  reports that she quit smoking about 41 years ago. Her smoking use included Cigarettes. She has a 15.00 pack-year smoking history. She has never used smokeless tobacco. She reports that she does not drink alcohol or use drugs.   Family History:  The patient's family history includes Leukemia in her father.    ROS:  Please see the history of present illness.   Otherwise, review of systems are positive for chest wall soreness.   All other systems are reviewed and negative.    PHYSICAL EXAM: VS:  BP (!) 157/69   Pulse 69   Ht 5' 3"  (1.6 m)   Wt 70.6 kg (155 lb 9.6 oz)   BMI 27.56 kg/m  , BMI Body mass index is 27.56 kg/m. GENERAL:  Well appearing.  No acute distress. HEENT:  Pupils equal round and reactive, fundi not visualized, oral mucosa unremarkable NECK:  No JVD.  Waveform within normal limits, carotid upstroke brisk and symmetric, no bruits LUNGS:  Clear to auscultation bilaterally.  No crackles, rhonchi or wheezes HEART:  Irregularly irregular.  PMI not displaced or sustained,S1 and S2 within normal limits, no S3, no S4, no clicks, no rubs, II/VI systolic murmur at the LUSB with radiation to the carotids.  Chest: Well-healed midline incision.  ABD:  Flat, positive bowel sounds normal in frequency in pitch, no bruits, no rebound, no guarding, no midline pulsatile mass, no hepatomegaly, no splenomegaly EXT:  2 plus pulses throughout, no edema. no cyanosis no clubbing SKIN:  No rashes no nodules.  Ecchymoses.  NEURO:  Cranial nerves II through XII grossly intact, motor grossly intact throughout PSYCH:  Cognitively intact, oriented to person place and time.  Depressed affect.   EKG:  EKG is not ordered today. The ekg ordered 05/21/16 demonstrates atrial  fibrillateion.  Prior septal infarct.  Rate 60 bpm.  Unchanged from 10/22/15. 10/27/16: Sinus rhythm.  Rate 60 bpm.  First degree heart block. 02/01/17: Sinus bradycardia with sinus arrhythmia and first degree block.   03/08/17: Sinus bradycardia with sinus arrhythmia.   First degree heart block.  Junctional escape.  QTc 497 ms.   04/26/17: Atrial fibrillation.  Rate 69 bpm.  Echo 02/15/17: Study Conclusions  - Left ventricle: The cavity size was normal. There was mild   concentric hypertrophy. Systolic function was normal. The   estimated ejection fraction was in the range of  60% to 65%. Wall   motion was normal; there were no regional wall motion   abnormalities. - Aortic valve: There was very mild stenosis. There was no   regurgitation. Peak velocity (S): 232 cm/s. Mean gradient (S): 12   mm Hg. - Mitral valve: A 1m Medtronic Mosaic bioprosthesis was present   and functioning properly. There was trivial regurgitation. - Left atrium: The atrium was severely dilated. - Right ventricle: The cavity size was mildly dilated. Wall   thickness was normal. Systolic function was normal. - Tricuspid valve: There was moderate regurgitation. - Pulmonary arteries: Systolic pressure was mildly increased. PA   peak pressure: 41 mm Hg (S).  Echo 06/07/16:  Study Conclusions  - Left ventricle: The cavity size was normal. Wall thickness was   normal. Systolic function was vigorous. The estimated ejection   fraction was in the range of 65% to 70%. - Mitral valve: MV is thickened. with some restricted motion Peak   and mean gradients through the valve are 22 and 8 mm Hg   respectively MVA by Pt1/2 is 1.9 cm2. Calcified annulus. Mildly   thickened leaflets . There was moderate to severe regurgitation. - Left atrium: The atrium was massively dilated. - Right atrium: The atrium was mildly dilated.   LHC/RHC 08/03/16: The left ventricular systolic function is normal.  LV end diastolic pressure is  normal.  The left ventricular ejection fraction is 55-65% by visual estimate.  There is no aortic valve stenosis.  There is severe mitral valve stenosis.  Prox Cx to Mid Cx lesion, 50 %stenosed.  Mid RCA-2 lesion, 80 %stenosed.  Mid RCA-1 lesion, 80 %stenosed.  Hemodynamic findings consistent with mild pulmonary hypertension.  LV end diastolic pressure is normal.   1. Coronary artery disease   - 50% mid LCx   - 80% sequential lesions in the mid RCA 2. Normal LV function 3. Severe mitral stenosis. MV gradient of 13 mm Hg. MVA 0.99 cm squared with index 0.57. 4. Moderate to severe mitral insufficiency 5. Mild pulmonary HTN. 6. Normal LV EDP 7. No significant AV gradient 8. Occluded right brachial artery.  Carotid Dopplers 10/12/16: 1-39% bilateral ICA stenosis  7 Day Event Monitor 06/28/17:  Quality: Fair.  Baseline artifact. Predominant rhythm: Sinus rhythm with PACs and sinus arrhythmia.    Cannot  rule out episodes of atrial fibrillation.  Recent Labs: 10/17/2016: Magnesium 2.0 12/20/2016: ALT 17 02/01/2017: BUN 15; Creatinine, Ser 0.90; Hemoglobin 12.9; Platelets 230.0; Potassium 3.5; Sodium 143; TSH 4.09    Lipid Panel    Component Value Date/Time   CHOL 158 12/20/2016 1112   TRIG 184.0 (H) 12/20/2016 1112   HDL 60.00 12/20/2016 1112   CHOLHDL 3 12/20/2016 1112   VLDL 36.8 12/20/2016 1112   LDLCALC 61 12/20/2016 1112   LDLDIRECT 134.7 08/28/2014 1009      Wt Readings from Last 3 Encounters:  07/26/17 70.6 kg (155 lb 9.6 oz)  06/15/17 68.9 kg (152 lb)  05/03/17 69.9 kg (154 lb)      ASSESSMENT AND PLAN:  # Rheumatic mitral valve disease: # s/p bioprosthetic mitral valve: Catherine [name] finally she has no evidence of heart failure on exam and her mitral valve gradients are doing better after her mitral valve surgery.  I think a lot of her symptoms before were attributable to depression.   She has no evidence of heart failure on exam and her mitral  valve gradients are unremarkable.  Continue Lasix 40 mill grams daily.  #  Mild aortic stenosis: Mean gradient 12 mmHg 01/2017.  Continue to monitor.  # Persistent atrial fibrillation:  # Bradycardia: # Palpitations: Catherine [name]'s rhythm is very difficult to assess.  She appears to be in sinus rhythm today.  She wore a 7-day event monitor that showed some clear P waves making me think that this is actually sinus rhythm and sinus bradycardia.  There was some irregularity to it which could either be PACs or atrial fibrillation.  I suspect it is sinus rhythm with PACs.  Either way, we will continue warfarin.  She is not on any nodal agents due to bradycardia.   # Hypertension:  Blood pressure is poorly controlled both here and at home.  We will start losartan.  She will have a basic metabolic panel checked next week with her PCP.  Continue HCTZ-triamterene and lasix.  Ideally she would not be on two diuretics, but her renal function has been stable and she has not tolerated any other agents.  # Hyperlipidemia: Continue rosuvastatin.  LDL 61on 11/2016.   Current medicines are reviewed at length with the patient today.  The patient does not have concerns regarding medicines.  The following changes have been made:  none  Labs/ tests ordered today include:   Orders Placed This Encounter  Procedures  . Basic metabolic panel    Disposition:   FU with Marquies Wanat C. Oval Linsey, MD, [facility] in 6 weeks.    This note was written with the assistance of speech recognition software.  Please excuse any transcriptional errors.  Signed, Alizia Greif C. Oval Linsey, MD, [facility]  07/26/2017 12:32 PM    [place]

## 2017-07-26 ENCOUNTER — Ambulatory Visit (INDEPENDENT_AMBULATORY_CARE_PROVIDER_SITE_OTHER): Payer: Medicare Other | Admitting: Cardiovascular Disease

## 2017-07-26 ENCOUNTER — Encounter: Payer: Self-pay | Admitting: Cardiovascular Disease

## 2017-07-26 VITALS — BP 157/69 | HR 69 | Ht 63.0 in | Wt 155.6 lb

## 2017-07-26 DIAGNOSIS — Z5181 Encounter for therapeutic drug level monitoring: Secondary | ICD-10-CM

## 2017-07-26 DIAGNOSIS — I10 Essential (primary) hypertension: Secondary | ICD-10-CM | POA: Diagnosis not present

## 2017-07-26 DIAGNOSIS — Z953 Presence of xenogenic heart valve: Secondary | ICD-10-CM

## 2017-07-26 DIAGNOSIS — I350 Nonrheumatic aortic (valve) stenosis: Secondary | ICD-10-CM | POA: Insufficient documentation

## 2017-07-26 DIAGNOSIS — R0602 Shortness of breath: Secondary | ICD-10-CM

## 2017-07-26 DIAGNOSIS — Z9889 Other specified postprocedural states: Secondary | ICD-10-CM | POA: Diagnosis not present

## 2017-07-26 DIAGNOSIS — I480 Paroxysmal atrial fibrillation: Secondary | ICD-10-CM

## 2017-07-26 DIAGNOSIS — Z8679 Personal history of other diseases of the circulatory system: Secondary | ICD-10-CM

## 2017-07-26 DIAGNOSIS — I050 Rheumatic mitral stenosis: Secondary | ICD-10-CM

## 2017-07-26 HISTORY — DX: Nonrheumatic aortic (valve) stenosis: I35.0

## 2017-07-26 MED ORDER — LOSARTAN POTASSIUM 25 MG PO TABS: 25.0000 mg | ORAL_TABLET | Freq: Every day | ORAL | 5 refills | Status: DC

## 2017-07-26 MED ORDER — ALBUTEROL SULFATE HFA 108 (90 BASE) MCG/ACT IN AERS: 2.0000 | INHALATION_SPRAY | Freq: Four times a day (QID) | RESPIRATORY_TRACT | 0 refills | Status: DC | PRN

## 2017-07-26 NOTE — Patient Instructions (Signed)
Medication Instructions:  START LOSARTAN 25 MG DAILY   ALBUTEROL INHALER 2 PUFFS EVERY 6 HOURS AS NEEDED   Labwork: BMET IN 1 WEEK AT PRIMARY CARE  Testing/Procedures: NONE  Follow-Up: Your physician wants you to follow-up in: 6 weeks   If you need a refill on your cardiac medications before your next appointment, please call your pharmacy.

## 2017-08-01 ENCOUNTER — Other Ambulatory Visit: Payer: Medicare Other

## 2017-08-01 ENCOUNTER — Telehealth: Payer: Self-pay | Admitting: Cardiovascular Disease

## 2017-08-01 ENCOUNTER — Encounter: Payer: Self-pay | Admitting: Internal Medicine

## 2017-08-01 ENCOUNTER — Ambulatory Visit (INDEPENDENT_AMBULATORY_CARE_PROVIDER_SITE_OTHER): Payer: Medicare Other | Admitting: Internal Medicine

## 2017-08-01 ENCOUNTER — Ambulatory Visit (INDEPENDENT_AMBULATORY_CARE_PROVIDER_SITE_OTHER): Payer: Medicare Other | Admitting: General Practice

## 2017-08-01 VITALS — BP 148/86 | HR 68 | Temp 98.4°F | Wt 155.8 lb

## 2017-08-01 DIAGNOSIS — R05 Cough: Secondary | ICD-10-CM

## 2017-08-01 DIAGNOSIS — R058 Other specified cough: Secondary | ICD-10-CM

## 2017-08-01 DIAGNOSIS — Z23 Encounter for immunization: Secondary | ICD-10-CM

## 2017-08-01 DIAGNOSIS — Z7901 Long term (current) use of anticoagulants: Secondary | ICD-10-CM | POA: Diagnosis not present

## 2017-08-01 DIAGNOSIS — J449 Chronic obstructive pulmonary disease, unspecified: Secondary | ICD-10-CM

## 2017-08-01 LAB — BASIC METABOLIC PANEL
BUN: 13 mg/dL (ref 6–23)
CHLORIDE: 100 meq/L (ref 96–112)
CO2: 33 meq/L — AB (ref 19–32)
Calcium: 9.6 mg/dL (ref 8.4–10.5)
Creatinine, Ser: 0.78 mg/dL (ref 0.40–1.20)
GFR: 75.52 mL/min (ref >60.00)
GLUCOSE: 89 mg/dL (ref 70–99)
POTASSIUM: 3.5 meq/L (ref 3.5–5.1)
SODIUM: 141 meq/L (ref 135–145)

## 2017-08-01 LAB — POCT INR: INR: 1.8

## 2017-08-01 MED ORDER — HYDROCODONE-HOMATROPINE 5-1.5 MG/5ML PO SYRP: 5.0000 mL | ORAL_SOLUTION | Freq: Three times a day (TID) | ORAL | 0 refills | Status: DC | PRN

## 2017-08-01 NOTE — Patient Instructions (Signed)
Pre visit review using our clinic review tool, if applicable. No additional management support is needed unless otherwise documented below in the visit note. 

## 2017-08-01 NOTE — Patient Instructions (Addendum)
This is proabaly a viral vxs allergic bronchitis  .   Your chest is clear  Today .    Cough med for comfort  And use your albuterol  As needed. And stay on the symbicort .    If getting worse instead of better get   Chest x ray  At the [facility] office and let us know.   To make sure not hidden pneumonia

## 2017-08-01 NOTE — Telephone Encounter (Signed)
Lab order faxed to PCP via Epic.    Left detailed message (ok per DPR) to make aware.  Advised to call back with questions or concerns.

## 2017-08-01 NOTE — Progress Notes (Signed)
Chief Complaint  Patient presents with  . Cough    Cough and runny nose x 3 days. Pt has been using Zyrtec. Pt notes thick yellow mucus. Denies fever.     HPI: Catherine [name] 82 y.o. here with husband here today  And visit to coumadinclinic   And   PCP  Booked  SDA      A week ofd runny nose  And  Suggested zyrtec . Dried nose up.  Then cough developed and spitting up since then .     and saw cardiologist  Heard rattle in top oif lung and  Given more zyrtec .   Symbicort  dialy  But and albuterol in day . Helps some      Copd   .  Better today than yesterday.  No hemoptysis    ROS: See pertinent positives and negatives per HPI.? Can she get flu vaccine?   Past Medical History:  Diagnosis Date  . Aortic stenosis, mild 07/26/2017  . Arthritis   . Asthma    last attack 02/2015  . Atrial fibrillation, chronic ([place])   . Cellulitis of left lower extremity 08/17/2016   Ulcer associated with severe venous insufficiency  . Chronic anticoagulation   . Chronic diastolic CHF (congestive heart failure) ([place])   . Chronic kidney disease    "RIGHT MANY KIDNEY INFECTIONS AND STONES"  . COPD (chronic obstructive pulmonary disease) ([place])   . Coronary artery disease   . Dizziness   . H/O: rheumatic fever   . Heart murmur   . Hypertension   . PONV (postoperative nausea and vomiting)    ' SOMETIMES', BUT NOT ALWAYS"  . S/P Maze operation for atrial fibrillation 10/14/2016   Complete bilateral atrial lesion set using cryothermy and bipolar radiofrequency ablation - atrial appendage was not treated due to previous surgical procedure (open mitral commissurotomy)  . S/P mitral valve replacement with bioprosthetic valve 10/14/2016   29 mm Medtronic Mosaic porcine bioprosthetic tissue valve  . UTI (urinary tract infection)   . Valvular heart disease    Has mitral stenosis with prior mitral commissurotomy in 1970    Family History  Problem Relation Age of Onset  . Leukemia Father     Social  History   Socioeconomic History  . Marital status: Married    Spouse name: None  . Number of children: None  . Years of education: None  . Highest education level: None  Social Needs  . Financial resource strain: None  . Food insecurity - worry: None  . Food insecurity - inability: None  . Transportation needs - medical: None  . Transportation needs - non-medical: None  Occupational History  . None  Tobacco Use  . Smoking status: Former Smoker    Packs/day: 1.00    Years: 15.00    Pack years: 15.00    Types: Cigarettes    Last attempt to quit: 09/28/1975    Years since quitting: 41.8  . Smokeless tobacco: Never Used  Substance and Sexual Activity  . Alcohol use: No    Alcohol/week: 0.0 oz  . Drug use: No  . Sexual activity: Yes  Other Topics Concern  . None  Social History Narrative  . None    Outpatient Medications Prior to Visit  Medication Sig Dispense Refill  . acetaminophen (TYLENOL) 500 MG tablet Take 500 mg by mouth every 6 (six) hours as needed for mild pain.    Marland Kitchen albuterol (PROVENTIL HFA;VENTOLIN HFA) 108 (90 Base)  MCG/ACT inhaler Inhale 2 puffs into the lungs every 6 (six) hours as needed for wheezing or shortness of breath. 1 Inhaler 0  . aspirin EC 81 MG EC tablet Take 1 tablet (81 mg total) by mouth daily.    . diazepam (VALIUM) 5 MG tablet TAKE 1/2 TABLET EACH MORNING AND 1 AT BEDTIME AS NEEDED 45 tablet 0  . furosemide (LASIX) 40 MG tablet Take 40 mg by mouth daily. Patient takes 1 tablet twice daily if needed for swelling    . losartan (COZAAR) 25 MG tablet Take 1 tablet (25 mg total) by mouth daily. 30 tablet 5  . OVER THE COUNTER MEDICATION Apply 1 application topically at bedtime as needed (Leg Pain). Magni-Life topical cream for leg pain.    . potassium chloride (K-DUR,KLOR-CON) 10 MEQ tablet Take 1 tablet (10 mEq total) by mouth 2 (two) times daily. Take an extra 2 tablets on days you take extra Furosemide 120 tablet 11  . rosuvastatin (CRESTOR) 5 MG  tablet Take 1 tablet (5 mg total) by mouth daily at 6 PM. 90 tablet 0  . SYMBICORT 80-4.5 MCG/ACT inhaler 2 PUFFS 2 TIMES A DAY 10.2 g 1  . triamterene-hydrochlorothiazide (MAXZIDE-25) 37.5-25 MG tablet Take 1 tablet by mouth daily. 90 tablet 3  . warfarin (COUMADIN) 2 MG tablet Take 1 tablet (2 mg total) by mouth daily at 6 PM. As directed by the coumadin clinic (Patient taking differently: Take 1-2 mg by mouth daily at 6 PM. As directed by the coumadin clinic,  Currently 1 tablet daily except 1/2 tablet on Monday - Wednesday - Friday.) 100 tablet 1   No facility-administered medications prior to visit.      EXAM:  BP (!) 148/86 (BP Location: Right Arm, Patient Position: Sitting, Cuff Size: Normal)   Pulse 68   Temp 98.4 F (36.9 C) (Oral)   Wt 155 lb 12.8 oz (70.7 kg)   BMI 27.60 kg/m   Body mass index is 27.6 kg/m.  GENERAL: vitals reviewed and listed above, alert, oriented, appears well hydrated and in no acute distress mild congestion   No toxic an well groomed HEENT: atraumatic, conjunctiva  clear, no obvious abnormalities on inspection of external nose and ears ym clear face NT nose min congestion OP : no lesion edema or exudate  NECK: no obvious masses on inspection palpation  LUNGS: clear to auscultation bilaterally, no wheezes, rales or rhonchi,CV: HRRR, no clubbing cyanosis or  nl cap refill  Cv  rr 2 /^ sem usb    MS: moves all extremities without noticeable focal  abnormality PSYCH: pleasant and cooperative, no obvious depression or anxiety  ASSESSMENT AND PLAN:  Discussed the following assessment and plan:  Respiratory tract congestion with cough - Plan: DG Chest 2 View, Basic metabolic panel  COPD GOLD II  - Plan: DG Chest 2 View, Basic metabolic panel  Need for influenza vaccination - Plan: Flu vaccine HIGH DOSE PF (Fluzone High dose) This is most likely a bronchitis but I do not see secondary infection nor decompensation.  Discussed using her albuterol as  needed caution with cough medicine to be used for comfort and not take it with the Valium that she uses for her restless legs.  Neuropathy. Expectant management order will be placed for chest x-ray if needed.  And follow-up for alarm symptoms of persistent progressive symptoms. Ok for flu vaccine -Patient advised to return or notify health care team  if symptoms worsen ,persist or new concerns arise.  Patient Instructions  This is proabaly a viral vxs allergic bronchitis  .   Your chest is clear  Today .    Cough med for comfort  And use your albuterol  As needed. And stay on the symbicort .    If getting worse instead of better get   Chest x ray  At the [facility] office and let us know.   To make sure not hidden pneumonia     Standley Brooking. Panosh M.D.

## 2017-08-01 NOTE — Telephone Encounter (Signed)
New message     Patient calling to request lab order for Potassium sent to pcp ofc. Please call.

## 2017-08-02 ENCOUNTER — Other Ambulatory Visit: Payer: Self-pay | Admitting: Thoracic Surgery (Cardiothoracic Vascular Surgery)

## 2017-08-09 ENCOUNTER — Ambulatory Visit (INDEPENDENT_AMBULATORY_CARE_PROVIDER_SITE_OTHER)
Admission: RE | Admit: 2017-08-09 | Discharge: 2017-08-09 | Disposition: A | Discharge status: Home / Self Care | Payer: Medicare Other | Source: Ambulatory Visit | Attending: Internal Medicine | Admitting: Internal Medicine

## 2017-08-09 DIAGNOSIS — R05 Cough: Secondary | ICD-10-CM

## 2017-08-09 DIAGNOSIS — J449 Chronic obstructive pulmonary disease, unspecified: Secondary | ICD-10-CM | POA: Diagnosis not present

## 2017-08-09 DIAGNOSIS — R058 Other specified cough: Secondary | ICD-10-CM

## 2017-08-29 ENCOUNTER — Ambulatory Visit (INDEPENDENT_AMBULATORY_CARE_PROVIDER_SITE_OTHER): Payer: Medicare Other | Admitting: General Practice

## 2017-08-29 DIAGNOSIS — I4891 Unspecified atrial fibrillation: Secondary | ICD-10-CM

## 2017-08-29 LAB — POCT INR: INR: 2.2

## 2017-08-29 NOTE — Patient Instructions (Addendum)
Pre visit review using our clinic review tool, if applicable. No additional management support is needed unless otherwise documented below in the visit note.  Start taking 1 tablet every day.  Re-check on 4 weeks.

## 2017-09-06 ENCOUNTER — Other Ambulatory Visit: Payer: Self-pay | Admitting: Family Medicine

## 2017-09-06 ENCOUNTER — Ambulatory Visit: Payer: Medicare Other | Admitting: Cardiovascular Disease

## 2017-09-08 ENCOUNTER — Other Ambulatory Visit: Payer: Self-pay | Admitting: Family Medicine

## 2017-10-03 ENCOUNTER — Telehealth: Payer: Self-pay | Admitting: *Deleted

## 2017-10-03 ENCOUNTER — Ambulatory Visit: Payer: Medicare Other | Admitting: Thoracic Surgery (Cardiothoracic Vascular Surgery)

## 2017-10-03 ENCOUNTER — Encounter: Payer: Self-pay | Admitting: Thoracic Surgery (Cardiothoracic Vascular Surgery)

## 2017-10-03 ENCOUNTER — Other Ambulatory Visit: Payer: Self-pay

## 2017-10-03 ENCOUNTER — Ambulatory Visit (INDEPENDENT_AMBULATORY_CARE_PROVIDER_SITE_OTHER): Payer: Medicare Other | Admitting: General Practice

## 2017-10-03 VITALS — BP 144/74 | HR 69 | Ht 63.0 in

## 2017-10-03 DIAGNOSIS — Z9889 Other specified postprocedural states: Secondary | ICD-10-CM

## 2017-10-03 DIAGNOSIS — Z7901 Long term (current) use of anticoagulants: Secondary | ICD-10-CM | POA: Diagnosis not present

## 2017-10-03 DIAGNOSIS — Z953 Presence of xenogenic heart valve: Secondary | ICD-10-CM | POA: Diagnosis not present

## 2017-10-03 DIAGNOSIS — Z951 Presence of aortocoronary bypass graft: Secondary | ICD-10-CM

## 2017-10-03 DIAGNOSIS — Z8679 Personal history of other diseases of the circulatory system: Secondary | ICD-10-CM | POA: Diagnosis not present

## 2017-10-03 LAB — POCT INR: INR: 1.9

## 2017-10-03 MED ORDER — DIAZEPAM 5 MG PO TABS: ORAL_TABLET | ORAL | 0 refills | Status: DC

## 2017-10-03 NOTE — Telephone Encounter (Signed)
Last refill 03/15/17.  Last office visit 04/18/17. -We recommend she continue to taper her diazepam to one half tablet at night and eventually off if possible. Okay to fill?

## 2017-10-03 NOTE — Patient Instructions (Addendum)
Pre visit review using our clinic review tool, if applicable. No additional management support is needed unless otherwise documented below in the visit note.  Take 2 tablets today (1/7) and then continue to start taking 1 tablet every day.  Re-check on 4 weeks.

## 2017-10-03 NOTE — Progress Notes (Signed)
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    She was last seen here in our office on January 17, 2017.  She underwent routine follow-up echocardiogram on Feb 15, 2017 which revealed a bioprosthetic tissue in the mitral valve position that was functioning normally with no perivalvular leak and trivial mitral regurgitation.  Left ventricular function appeared normal with ejection fraction estimated 60-65%.  There was severe left atrial enlargement and mild pulmonary hypertension.  Since then she has been seen in follow-up on several occasions by Dr. Oval Linsey, most recently on July 26, 2017.  She was also seen in follow-up in the atrial fibrillation clinic last August for surveillance following Maze procedure.  Patient returns for office today and reports that she is doing exceptionally well.  She completed the cardiac rehab program and is now exercising several times a week at a local [employer].  She states that she feels much better than she did before.  She remains on Lasix once a day but her weight has been stable and she has not had to double up on doses for quite some time now.  She no longer has problems with lower extremity edema.  She only gets short of breath with more strenuous exertion and this only slight limits her  activities.  She states that her breathing is much better than it was prior to surgery.  She denies any chest pain or chest tightness.  She has not had any palpitations or other symptoms to suggest a recurrence of significant atrial dysrhythmias.   Current Outpatient Medications  Medication Sig Dispense Refill  . acetaminophen (TYLENOL) 500 MG tablet Take 500 mg by mouth every 6 (six) hours as needed for mild pain.    Marland Kitchen albuterol (PROVENTIL HFA;VENTOLIN HFA) 108 (90 Base) MCG/ACT inhaler Inhale 2 puffs into the lungs every 6 (six) hours as needed for wheezing or shortness of breath. 1 Inhaler 0  . aspirin EC 81 MG EC tablet Take 1 tablet (81 mg total) by mouth daily.    . diazepam (VALIUM) 5 MG tablet TAKE 1/2 TABLET EACH MORNING AND 1 AT BEDTIME AS NEEDED 45 tablet 0  . furosemide (LASIX) 40 MG tablet Take 40 mg by mouth daily. Patient takes 1 tablet twice daily if needed for swelling    . losartan (COZAAR) 25 MG tablet Take 1 tablet (25 mg total) by mouth daily. 30 tablet 5  . OVER THE COUNTER MEDICATION Apply 1 application topically at bedtime as needed (Leg Pain). Magni-Life topical cream for leg pain.    . potassium chloride (K-DUR,KLOR-CON) 10 MEQ tablet Take 1 tablet (10 mEq total) by mouth 2 (two) times daily. Take an extra 2 tablets on days you take extra Furosemide 120 tablet 11  . rosuvastatin (CRESTOR) 5 MG tablet Take 1 tablet (5 mg total) by mouth  daily at 6 PM. 90 tablet 1  . SYMBICORT 80-4.5 MCG/ACT inhaler 2 PUFFS 2 TIMES A DAY 10.2 g 0  . triamterene-hydrochlorothiazide (MAXZIDE-25) 37.5-25 MG tablet Take 1 tablet by mouth daily. 90 tablet 3  . warfarin (COUMADIN) 2 MG tablet TAKE 1 TABLET ONCE DAILY AT 6PM, OR AS DIRECTED BY COUMADIN CLINIC 90 tablet 0   No current facility-administered medications for this visit.       Physical Exam:   BP (!) 144/74 (BP Location: Left Arm, Patient Position: Sitting, Cuff Size: Normal)   Pulse 69   Ht 5\' 3"  (1.6 m)   SpO2 97% Comment: RA   BMI 27.60 kg/m   General:  Well-appearing  Chest:   Clear to auscultation  CV:   Regular rate and rhythm without murmur  Incisions:  Completely healed, sternum is stable  Abdomen:  Soft nontender  Extremities:  Warm and well perfused, no edema  Diagnostic Tests:  2 channel telemetry rhythm strip demonstrates what appears to be junctional rhythm or ectopic atrial rhythm.  The R to R interval is stable.  The QRS complex is narrow.  No P waves can be seen.   Transthoracic Echocardiography  Patient:    Graclyn, Lawther MR #:       [id number] Study Date: 02/15/2017 Gender:     F Age:        86 Height:     160 cm Weight:     68.9 kg BSA:        1.77 m^2 Pt. Status: Room:   SONOGRAPHER  Cindy Hazy, RDCS  PERFORMING   [facility], Outpatient  ATTENDING    [name], MD  ORDERING     [name], MD  [place], MD  cc:  ------------------------------------------------------------------- LV EF: 60% -   65%  ------------------------------------------------------------------- Indications:      I05.9 Mitral Valve Disorder.  ------------------------------------------------------------------- History:   PMH:  Acquired from the patient and from the patient&'s chart.  PMH:  Chronic Atrial Fibrillation. COPD. Chronic Kidney Disease. CAD. Murmur. History of Rheumatic Fever.  Risk factors: Hypertension.  ------------------------------------------------------------------- Study Conclusions  - Left ventricle: The cavity size was normal. There was mild   concentric hypertrophy. Systolic function was normal. The   estimated ejection fraction was in the range of 60% to 65%. Wall   motion was normal; there were no regional wall motion   abnormalities. - Aortic valve: There was very mild stenosis. There was no   regurgitation. Peak velocity (S): 232 cm/s. Mean gradient (S): 12   mm Hg. - Mitral valve: A 78mm Medtronic Mosaic bioprosthesis was  present   and functioning properly. There was trivial regurgitation. - Left atrium: The atrium was severely dilated. - Right ventricle: The cavity size was mildly dilated. Wall   thickness was normal. Systolic function was normal. - Tricuspid valve: There was moderate regurgitation. - Pulmonary arteries: Systolic pressure was mildly increased. PA   peak pressure: 41 mm Hg (S).  ------------------------------------------------------------------- Labs, prior tests, procedures, and surgery: Status post Mitral Valve Replacement- 29 mm Medtronic Mosaic Porcine Bioprosthetic Tissue Valve.  ------------------------------------------------------------------- Study data:   Study status:  Routine.  Procedure:  The patient reported no pain pre or post test. Transthoracic echocardiography for left ventricular function evaluation, for right ventricular function evaluation, and for assessment of valvular function. Image quality was adequate.  Study completion:  There were no complications.          Transthoracic echocardiography.  M-mode,  complete 2D, spectral Doppler, and color Doppler.  Birthdate: Patient birthdate: Dec 01, 1934.  Age:  Patient is 83 yr old.  Sex: Gender: female.    BMI: 26.9 kg/m^2.  Blood pressure:     140/74 Patient status:  Outpatient.  Study date:  Study date: 02/15/2017. Study time: 01:20 PM.  Location:  [facility]  -------------------------------------------------------------------  ------------------------------------------------------------------- Left ventricle:  The cavity size was normal. There was mild concentric hypertrophy. Systolic function was normal. The estimated ejection fraction was in the range of 60% to 65%. Wall motion was normal; there were no regional wall motion abnormalities.  ------------------------------------------------------------------- Aortic valve:   Trileaflet; mildly thickened, mildly calcified leaflets. Mobility was not  restricted.  Doppler:   There was very mild stenosis.   There was no regurgitation.    VTI ratio of LVOT to aortic valve: 0.89. Valve area (VTI): 2.53 cm^2. Indexed valve area (VTI): 1.43 cm^2/m^2. Peak velocity ratio of LVOT to aortic valve: 0.86. Valve area (Vmax): 2.44 cm^2. Indexed valve area (Vmax): 1.38 cm^2/m^2. Mean velocity ratio of LVOT to aortic valve: 0.88. Valve area (Vmean): 2.49 cm^2. Indexed valve area (Vmean): 1.41 cm^2/m^2.    Mean gradient (S): 12 mm Hg. Peak gradient (S): 22 mm Hg.  ------------------------------------------------------------------- Aorta:  Aortic root: The aortic root was normal in size.  ------------------------------------------------------------------- Mitral valve:  A 77mm Medtronic Mosaic bioprosthesis was present and functioning properly. Mobility was not restricted.  Doppler: Transvalvular velocity was within the normal range. There was no evidence for stenosis. There was trivial regurgitation.    Valve area by pressure half-time: 1.49 cm^2. Indexed valve area by pressure half-time: 0.84 cm^2/m^2. Valve area by continuity equation (using LVOT flow): 2.69 cm^2. Indexed valve area by continuity equation (using LVOT flow): 1.52 cm^2/m^2.    Mean gradient (D): 4 mm Hg. Peak gradient (D): 12 mm Hg.  ------------------------------------------------------------------- Left atrium:  The atrium was severely dilated.  ------------------------------------------------------------------- Right ventricle:  The cavity size was mildly dilated. Wall thickness was normal. Systolic function was normal.  ------------------------------------------------------------------- Pulmonic valve:    Structurally normal valve.   Cusp separation was normal.  Doppler:  Transvalvular velocity was within the normal range. There was no evidence for stenosis. There was no regurgitation.  ------------------------------------------------------------------- Tricuspid  valve:   Structurally normal valve.    Doppler: Transvalvular velocity was within the normal range. There was moderate regurgitation.  ------------------------------------------------------------------- Pulmonary artery:   The main pulmonary artery was normal-sized. Systolic pressure was mildly increased.  ------------------------------------------------------------------- Right atrium:  The atrium was normal in size.  ------------------------------------------------------------------- Pericardium:  There was no pericardial effusion.  ------------------------------------------------------------------- Systemic veins: Inferior vena cava: The vessel was dilated. The respirophasic diameter changes were blunted (< 50%), consistent with elevated central venous pressure.  ------------------------------------------------------------------- Measurements   Left ventricle                            Value          Reference  LV ID, ED, PLAX chordal           (L)     39    mm       43 - 52  LV ID, ES, PLAX chordal                   24.2  mm       23 - 38  LV fx shortening, PLAX chordal  38    %        >=29  LV PW thickness, ED                       12.8  mm       ---------  IVS/LV PW ratio, ED                       1.06           <=1.3  Stroke volume, 2D                         136   ml       ---------  Stroke volume/bsa, 2D                     77    ml/m^2   ---------    Ventricular septum                        Value          Reference  IVS thickness, ED                         13.6  mm       ---------    LVOT                                      Value          Reference  LVOT ID, S                                19    mm       ---------  LVOT area                                 2.84  cm^2     ---------  LVOT ID                                   20    mm       ---------  LVOT peak velocity, S                     199   cm/s     ---------  LVOT mean velocity, S                      142   cm/s     ---------  LVOT VTI, S                               47.9  cm       ---------  LVOT peak gradient, S                     16    mm Hg    ---------  Stroke volume (SV), LVOT DP               150.5 ml       ---------  Stroke index (SV/bsa), LVOT DP            85.1  ml/m^2   ---------    Aortic valve                              Value          Reference  Aortic valve peak velocity, S             232   cm/s     ---------  Aortic valve mean velocity, S             162   cm/s     ---------  Aortic valve VTI, S                       53.7  cm       ---------  Aortic mean gradient, S                   12    mm Hg    ---------  Aortic peak gradient, S                   22    mm Hg    ---------  VTI ratio, LVOT/AV                        0.89           ---------  Aortic valve area, VTI                    2.53  cm^2     ---------  Aortic valve area/bsa, VTI                1.43  cm^2/m^2 ---------  Velocity ratio, peak, LVOT/AV             0.86           ---------  Aortic valve area, peak velocity          2.44  cm^2     ---------  Aortic valve area/bsa, peak               1.38  cm^2/m^2 ---------  velocity  Velocity ratio, mean, LVOT/AV             0.88           ---------  Aortic valve area, mean velocity          2.49  cm^2     ---------  Aortic valve area/bsa, mean               1.41  cm^2/m^2 ---------  velocity    Aorta                                     Value          Reference  Aortic root ID, ED                        33    mm       ---------  Ascending aorta ID, A-P, S                36    mm       ---------  Left atrium                               Value          Reference  LA ID, A-P, ES                            59    mm       ---------  LA ID/bsa, A-P                    (H)     3.34  cm/m^2   <=2.2  LA volume, S                              104   ml       ---------  LA volume/bsa, S                          58.8  ml/m^2   ---------  LA volume, ES, 1-p A4C                     72    ml       ---------  LA volume/bsa, ES, 1-p A4C                40.7  ml/m^2   ---------  LA volume, ES, 1-p A2C                    131   ml       ---------  LA volume/bsa, ES, 1-p A2C                74.1  ml/m^2   ---------    Mitral valve                              Value          Reference  Mitral E-wave peak velocity               170   cm/s     ---------  Mitral mean velocity, D                   85.9  cm/s     ---------  Mitral deceleration time          (H)     394   ms       150 - 230  Mitral pressure half-time                 163   ms       ---------  Mitral mean gradient, D                   4     mm Hg    ---------  Mitral peak gradient, D                   12    mm Hg    ---------  Mitral valve area, PHT, DP                1.49  cm^2     ---------  Mitral valve area/bsa, PHT, DP  0.84  cm^2/m^2 ---------  Mitral valve area, LVOT                   2.69  cm^2     ---------  continuity  Mitral valve area/bsa, LVOT               1.52  cm^2/m^2 ---------  continuity  Mitral annulus VTI, D                     50.6  cm       ---------    Pulmonary arteries                        Value          Reference  PA pressure, S, DP                (H)     41    mm Hg    <=30    Tricuspid valve                           Value          Reference  Tricuspid regurg peak velocity            256   cm/s     ---------  Tricuspid peak RV-RA gradient             26    mm Hg    ---------    Systemic veins                            Value          Reference  Estimated CVP                             15    mm Hg    ---------    Right ventricle                           Value          Reference  RV pressure, S, DP                (H)     41    mm Hg    <=30  RV s&', lateral, S                         10    cm/s     ---------  Legend: (L)  and  (H)  mark values outside specified reference  range.  ------------------------------------------------------------------- Prepared and Electronically Authenticated by  [name], MD 2018-05-22T17:37:05    Impression:  Patient is doing very well approximately 1 year status post mitral valve replacement using a bioprosthetic tissue valve, coronary artery bypass grafting x1, and Maze procedure.  Her rhythm is narrow complex and regular but no P waves can be seen.  Clinically she is doing very well.  She remains chronically anticoagulated using warfarin which the patient dislikes.  Her CHADSVASC score is probably at least 5.  Plan:  We have not recommended any changes the patient's current medications.  Because her mitral valve was replaced using a bioprosthetic tissue valve, long-term anticoagulation using a DOAC would probably be reasonable to  consider if desired.  Although her rhythm appears to be very well controlled I would be reluctant to stop anticoagulation given the patient's relatively high risk for recurrence of atrial fibrillation and/or stroke.  The patient has been reminded regarding the importance of dental hygiene and the lifelong need for antibiotic prophylaxis for all dental cleanings and other related invasive procedures.  The patient will continue to follow-up with Dr. Oval Linsey.  She will return to our office in the future only should specific problems or questions arise.  I spent in excess of 15 minutes during the conduct of this office consultation and >50% of this time involved direct face-to-face encounter with the patient for counseling and/or coordination of their care.    Valentina Gu. Roxy Manns, MD 10/03/2017 12:31 PM

## 2017-10-03 NOTE — Patient Instructions (Addendum)
Continue all previous medications without any changes at this time  Discuss with your cardiologist whether or not you could stop taking Coumadin and be treated with an alternative blood thinner  Endocarditis is a potentially serious infection of heart valves or inside lining of the heart.  It occurs more commonly in patients with diseased heart valves (such as patient's with aortic or mitral valve disease) and in patients who have undergone heart valve repair or replacement.  Certain surgical and dental procedures may put you at risk, such as dental cleaning, other dental procedures, or any surgery involving the respiratory, urinary, gastrointestinal tract, gallbladder or prostate gland.   To minimize your chances for develooping endocarditis, maintain good oral health and seek prompt medical attention for any infections involving the mouth, teeth, gums, skin or urinary tract.    Always notify your doctor or dentist about your underlying heart valve condition before having any invasive procedures. You will need to take antibiotics before certain procedures, including all routine dental cleanings or other dental procedures.  Your cardiologist or dentist should prescribe these antibiotics for you to be taken ahead of time.

## 2017-10-03 NOTE — Telephone Encounter (Signed)
Rx called in 

## 2017-10-03 NOTE — Telephone Encounter (Signed)
Refill OK

## 2017-10-04 ENCOUNTER — Other Ambulatory Visit: Payer: Self-pay | Admitting: Family Medicine

## 2017-10-07 ENCOUNTER — Other Ambulatory Visit: Payer: Self-pay | Admitting: Family Medicine

## 2017-10-07 DIAGNOSIS — Z139 Encounter for screening, unspecified: Secondary | ICD-10-CM

## 2017-10-10 ENCOUNTER — Ambulatory Visit: Payer: Medicare Other | Admitting: Thoracic Surgery (Cardiothoracic Vascular Surgery)

## 2017-10-10 ENCOUNTER — Ambulatory Visit: Payer: Medicare Other

## 2017-10-10 NOTE — Progress Notes (Signed)
Cardiology Office Note   Date:  10/11/2017   ID:  Catherine [name], DOB [birth year], MRN [id number]  PCP:  [name], MD  Cardiologist:   [name], MD  Cardiothoracic surgeon: Dr. Roxy Manns  No chief complaint on file.    History of Present Illness: Catherine [name] is a 83 y.o. 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 Catherine [name] was previously a patient of [name]. She underwent mitral commissurotomy in the 1970s. On her echocardiogram 02/2014 she had an ejection fraction of 60-65% with moderate to severe mitral regurgitation. There was also mild aortic regurgitation. Her follow-up echo 02/2015 showed an EF of 60-65% with moderate to severe mitral regurgitation, moderate tricuspid regurgitation and elevated pulmonary artery pressures.  Repeat echo 9/11/7 revealed moderate pulmonary stenosis with moderate to severe mitral regurgitation.  She did not have any pulmonary hypertension and her LV was not dilated.  She was referred for left and right heart catheterization. She was found to have severe mitral stenosis with a 13 mmHg mean gradient, MVA 0.99 cm^2. She also had 50% LCx and 80% RCA lesions.  She had a 29 mm bioprosthetic mitral valve, single vessel CABG and MAZE with Dr. Roxy Manns on 10/14/16.  She was referred for a baseline echo 02/15/17 that revealed LVEF 60-65% with mild aortic stenosis (mean gradient 12 mmHg).  Her bioprosthetic mitral valve was functioning well.    After her surgery Catherine [name] continued to report fatigue.  It was unclear whether she was in atrial fibrillation or sinus rhythm with a long PR interval.  She was referred to the atrial fibrillation clinic where this remained unclear.  However, it was felt that if she were in atrial fibrillation it would not be possible to maintain sinus rhythm.  His last  appointment she developed lower extremity edema. She stopped amlodipine and this improved. This was switched to irbesartan. She then developed diaphoresis and insomnia that she attributed to this medication.  Catherine [name] has been doing better since her last appointment.  She continues to exercise three times per week at the [facility].  She has met several very good friends and her mood has been much better.  For the last week she has been struggling with an upper respiratory infection productive of yellow sputum.  She has some shortness of breath but her Symbicort inhaler helps.  She still has experienced several coughing attacks and has difficulty sleeping at night.  She denies fever or chills.  She checks her blood pressure regularly and it has been ranging from the 130s-150s over 60s-80s.  She is frustrated that she cannot seem to lose weight despite exercising regularly.  She does note that she stopped at [employer] for breakfast after each workout.  Lately she has been taking Lasix once daily and has no lower extremity edema or orthopnea.  At her last appointment Catherine [name]'s blood pressure was elevated so she was started on losartan.  Since making that change her blood pressure has been mostly elevated at home.  On average it is in the 878M systolic.  One month ago she started to feel like herself again.  She continues to exercise at the [facility] 3 days/week.  She feels good with exercise and has no chest pain or shortness of breath.  She also enjoys socializing with her friends there.  She has no lower extremity edema,  orthopnea, or PND.  She recently saw Dr. Roxy Manns and was released from his clinic.   She stopped taking rosuvastatin and had improvement in her myalgias.   Past Medical History:  Diagnosis Date  . Aortic stenosis, mild 07/26/2017  . Arthritis   . Asthma    last attack 02/2015  . Atrial fibrillation, chronic ([place])   . Cellulitis of left lower extremity 08/17/2016   Ulcer associated with severe  venous insufficiency  . Chronic anticoagulation   . Chronic diastolic CHF (congestive heart failure) ([place])   . Chronic kidney disease    "RIGHT MANY KIDNEY INFECTIONS AND STONES"  . COPD (chronic obstructive pulmonary disease) ([place])   . Coronary artery disease   . Dizziness   . H/O: rheumatic fever   . Heart murmur   . Hypertension   . PONV (postoperative nausea and vomiting)    ' SOMETIMES', BUT NOT ALWAYS"  . S/P Maze operation for atrial fibrillation 10/14/2016   Complete bilateral atrial lesion set using cryothermy and bipolar radiofrequency ablation - atrial appendage was not treated due to previous surgical procedure (open mitral commissurotomy)  . S/P mitral valve replacement with bioprosthetic valve 10/14/2016   29 mm Medtronic Mosaic porcine bioprosthetic tissue valve  . UTI (urinary tract infection)   . Valvular heart disease    Has mitral stenosis with prior mitral commissurotomy in 1970    Past Surgical History:  Procedure Laterality Date  . ABDOMINAL HYSTERECTOMY  1983   endometriosis  . APPENDECTOMY    . BACK SURGERY     neurosurgery x2  . CARDIAC CATHETERIZATION    . CARDIAC CATHETERIZATION N/A 08/03/2016   Procedure: Right/Left Heart Cath and Coronary Angiography;  Surgeon: Peter M Martinique, MD;  Location: [place] [facility];  Service: Cardiovascular;  Laterality: N/A;  . cataract surg    . CHOLECYSTECTOMY N/A 05/30/2015   Procedure: LAPAROSCOPIC CHOLECYSTECTOMY WITH INTRAOPERATIVE CHOLANGIOGRAM;  Surgeon: Excell Seltzer, MD;  Location: [place];  Service: General;  Laterality: N/A;  . COLONOSCOPY    . CORONARY ARTERY BYPASS GRAFT N/A 10/14/2016   Procedure: CORONARY ARTERY BYPASS GRAFTING (CABG);  Surgeon: Rexene Alberts, MD;  Location: [place];  Service: Open Heart Surgery;  Laterality: N/A;  . EYE SURGERY    . MAZE N/A 10/14/2016   Procedure: MAZE;  Surgeon: Rexene Alberts, MD;  Location: [place];  Service: Open Heart Surgery;  Laterality: N/A;  . MITRAL VALVE  REPLACEMENT N/A 10/14/2016   Procedure: REDO MITRAL VALVE REPLACEMENT (MVR);  Surgeon: Rexene Alberts, MD;  Location: [place];  Service: Open Heart Surgery;  Laterality: N/A;  . MITRAL VALVE SURGERY Left 1970   Open mitral commissurotomy via left thoracotomy approach  . TEE WITHOUT CARDIOVERSION N/A 07/05/2016   Procedure: TRANSESOPHAGEAL ECHOCARDIOGRAM (TEE);  Surgeon: [name], MD;  Location: [place];  Service: Cardiovascular;  Laterality: N/A;  . TEE WITHOUT CARDIOVERSION N/A 10/14/2016   Procedure: TRANSESOPHAGEAL ECHOCARDIOGRAM (TEE);  Surgeon: Rexene Alberts, MD;  Location: [place];  Service: Open Heart Surgery;  Laterality: N/A;    Current Outpatient Medications  Medication Sig Dispense Refill  . acetaminophen (TYLENOL) 500 MG tablet Take 500 mg by mouth every 6 (six) hours as needed for mild pain.    Marland Kitchen albuterol (PROVENTIL HFA;VENTOLIN HFA) 108 (90 Base) MCG/ACT inhaler Inhale 2 puffs into the lungs every 6 (six) hours as needed for wheezing or shortness of breath. 1 Inhaler 0  . aspirin EC 81 MG EC tablet Take 1  tablet (81 mg total) by mouth daily.    . diazepam (VALIUM) 5 MG tablet Take half tab at bedtime as needed 45 tablet 0  . furosemide (LASIX) 40 MG tablet Take 40 mg by mouth daily. Patient takes 1 tablet twice daily if needed for swelling    . losartan (COZAAR) 25 MG tablet Take 1 tablet (25 mg total) by mouth daily. 30 tablet 5  . OVER THE COUNTER MEDICATION Apply 1 application topically at bedtime as needed (Leg Pain). Magni-Life topical cream for leg pain.    . potassium chloride (K-DUR,KLOR-CON) 10 MEQ tablet Take 1 tablet (10 mEq total) by mouth 2 (two) times daily. Take an extra 2 tablets on days you take extra Furosemide 120 tablet 11  . rosuvastatin (CRESTOR) 5 MG tablet Take 1 tablet (5 mg total) by mouth daily at 6 PM. 90 tablet 1  . SYMBICORT 80-4.5 MCG/ACT inhaler 2 PUFFS 2 TIMES A DAY 10.2 g 0  . triamterene-hydrochlorothiazide (MAXZIDE-25) 37.5-25 MG  tablet Take 1 tablet by mouth daily. 90 tablet 3  . warfarin (COUMADIN) 2 MG tablet TAKE 1 TABLET ONCE DAILY AT 6PM, OR AS DIRECTED BY COUMADIN CLINIC 90 tablet 0   No current facility-administered medications for this visit.     Allergies:   Aldactone [spironolactone]; Amoxicillin; Diltiazem; Flagyl [metronidazole hcl]; Flovent [fluticasone propionate]; Gabapentin; Lyrica [pregabalin]; Quinidine; Simvastatin; Tramadol; Verapamil; Amlodipine; Ace inhibitors; Benazepril hcl; Ciprocin-fluocin-procin [fluocinolone acetonide]; Ciprofloxacin; Codeine; and Nitrofurantoin monohyd macro    Social History:  The patient  reports that she quit smoking about 42 years ago. Her smoking use included cigarettes. She has a 15.00 pack-year smoking history. she has never used smokeless tobacco. She reports that she does not drink alcohol or use drugs.   Family History:  The patient's family history includes Leukemia in her father.    ROS:  Please see the history of present illness.   Otherwise, review of systems are positive for chest wall soreness.   All other systems are reviewed and negative.    PHYSICAL EXAM: VS:  BP 130/66   Pulse 67   Ht 5' 3"  (1.6 m)   Wt 150 lb (68 kg)   SpO2 97%   BMI 26.57 kg/m  , BMI Body mass index is 26.57 kg/m. GENERAL:  Well appearing HEENT: Pupils equal round and reactive, fundi not visualized, oral mucosa unremarkable NECK:  No jugular venous distention, waveform within normal limits, carotid upstroke brisk and symmetric, no bruits, no thyromegaly LYMPHATICS:  No cervical adenopathy LUNGS:  Clear to auscultation bilaterally HEART: Irregularly irregular.  PMI not displaced or sustained,S1 and S2 within normal limits, no S3, no S4, no clicks, no rubs, II/VI systolic murmur at the LUSB with radiation into the carotids ABD:  Flat, positive bowel sounds normal in frequency in pitch, no bruits, no rebound, no guarding, no midline pulsatile mass, no hepatomegaly, no  splenomegaly EXT:  2 plus pulses throughout, no edema, no cyanosis no clubbing SKIN:  No rashes no nodules NEURO:  Cranial nerves II through XII grossly intact, motor grossly intact throughout PSYCH:  Cognitively intact, oriented to person place and time   EKG:  EKG is not ordered today. The ekg ordered 05/21/16 demonstrates atrial fibrillateion.  Prior septal infarct.  Rate 60 bpm.  Unchanged from 10/22/15. 10/27/16: Sinus rhythm.  Rate 60 bpm.  First degree heart block. 02/01/17: Sinus bradycardia with sinus arrhythmia and first degree block.   03/08/17: Sinus bradycardia with sinus arrhythmia.   First degree heart  block.  Junctional escape.  QTc 497 ms.   04/26/17: Atrial fibrillation.  Rate 69 bpm.  Echo 02/15/17: Study Conclusions  - Left ventricle: The cavity size was normal. There was mild   concentric hypertrophy. Systolic function was normal. The   estimated ejection fraction was in the range of 60% to 65%. Wall   motion was normal; there were no regional wall motion   abnormalities. - Aortic valve: There was very mild stenosis. There was no   regurgitation. Peak velocity (S): 232 cm/s. Mean gradient (S): 12   mm Hg. - Mitral valve: A 82m Medtronic Mosaic bioprosthesis was present   and functioning properly. There was trivial regurgitation. - Left atrium: The atrium was severely dilated. - Right ventricle: The cavity size was mildly dilated. Wall   thickness was normal. Systolic function was normal. - Tricuspid valve: There was moderate regurgitation. - Pulmonary arteries: Systolic pressure was mildly increased. PA   peak pressure: 41 mm Hg (S).  Echo 06/07/16:  Study Conclusions  - Left ventricle: The cavity size was normal. Wall thickness was   normal. Systolic function was vigorous. The estimated ejection   fraction was in the range of 65% to 70%. - Mitral valve: MV is thickened. with some restricted motion Peak   and mean gradients through the valve are 22 and 8 mm  Hg   respectively MVA by Pt1/2 is 1.9 cm2. Calcified annulus. Mildly   thickened leaflets . There was moderate to severe regurgitation. - Left atrium: The atrium was massively dilated. - Right atrium: The atrium was mildly dilated.  LHC/RHC 08/03/16: The left ventricular systolic function is normal.  LV end diastolic pressure is normal.  The left ventricular ejection fraction is 55-65% by visual estimate.  There is no aortic valve stenosis.  There is severe mitral valve stenosis.  Prox Cx to Mid Cx lesion, 50 %stenosed.  Mid RCA-2 lesion, 80 %stenosed.  Mid RCA-1 lesion, 80 %stenosed.  Hemodynamic findings consistent with mild pulmonary hypertension.  LV end diastolic pressure is normal.   1. Coronary artery disease   - 50% mid LCx   - 80% sequential lesions in the mid RCA 2. Normal LV function 3. Severe mitral stenosis. MV gradient of 13 mm Hg. MVA 0.99 cm squared with index 0.57. 4. Moderate to severe mitral insufficiency 5. Mild pulmonary HTN. 6. Normal LV EDP 7. No significant AV gradient 8. Occluded right brachial artery.  Carotid Dopplers 10/12/16: 1-39% bilateral ICA stenosis  7 Day Event Monitor 06/28/17:  Quality: Fair.  Baseline artifact. Predominant rhythm: Sinus rhythm with PACs and sinus arrhythmia.    Cannot  rule out episodes of atrial fibrillation.  Recent Labs: 10/17/2016: Magnesium 2.0 12/20/2016: ALT 17 02/01/2017: Hemoglobin 12.9; Platelets 230.0; TSH 4.09 08/01/2017: BUN 13; Creatinine, Ser 0.78; Potassium 3.5; Sodium 141    Lipid Panel    Component Value Date/Time   CHOL 158 12/20/2016 1112   TRIG 184.0 (H) 12/20/2016 1112   HDL 60.00 12/20/2016 1112   CHOLHDL 3 12/20/2016 1112   VLDL 36.8 12/20/2016 1112   LDLCALC 61 12/20/2016 1112   LDLDIRECT 134.7 08/28/2014 1009      Wt Readings from Last 3 Encounters:  10/11/17 150 lb (68 kg)  08/01/17 155 lb 12.8 oz (70.7 kg)  07/26/17 155 lb 9.6 oz (70.6 kg)      ASSESSMENT AND  PLAN:  # Rheumatic mitral valve disease: # s/p bioprosthetic mitral valve: Ms. HKovacevicis doing well.  She has no evidence  of heart failure on exam.  Her volume status is well-controlled.  Continue furosemide.  She needs antibiotics prior to dental procedures.  # Mild aortic stenosis: Mean gradient 12 mmHg 01/2017.  Continue to monitor.  # Persistent atrial fibrillation:  # Bradycardia: # Palpitations:  Rate well-controlled.  She discussed possibly switching from warfarin to a DOAC with Dr. Roxy Manns.  Given that she is doing well on warfarin and the data is limited in patients with bioprosthetic valves we will continue with warfarin for now.  # Hypertension:  Blood pressure is well-controlled here but has been elevated at home.  We will increase losartan to 50 mg.  Check BMP in 1 week.  Continue hydrochlorthiazide and triamterene.  # Nonobstructive coronary disease: # Hyperlipidemia: Rosuvastatin was stopped due to myalgias and she is feeling better.  She has tried other statins in the past and did not tolerate them.  We will try Zetia and repeat lipids and LFTs in 3 months.  Continue aspirin.   Current medicines are reviewed at length with the patient today.  The patient does not have concerns regarding medicines.  The following changes have been made:  none  Labs/ tests ordered today include:   No orders of the defined types were placed in this encounter.   Disposition:   FU with Javonte Elenes C. Oval Linsey, MD, [facility] in 6 weeks.    This note was written with the assistance of speech recognition software.  Please excuse any transcriptional errors.  Signed, Samayra Hebel C. Oval Linsey, MD, [facility]  10/11/2017 2:21 PM    [facility]

## 2017-10-11 ENCOUNTER — Encounter: Payer: Self-pay | Admitting: Cardiovascular Disease

## 2017-10-11 ENCOUNTER — Ambulatory Visit: Payer: Medicare Other | Admitting: Cardiovascular Disease

## 2017-10-11 VITALS — BP 130/66 | HR 67 | Ht 63.0 in | Wt 150.0 lb

## 2017-10-11 DIAGNOSIS — I050 Rheumatic mitral stenosis: Secondary | ICD-10-CM | POA: Diagnosis not present

## 2017-10-11 DIAGNOSIS — Z5181 Encounter for therapeutic drug level monitoring: Secondary | ICD-10-CM | POA: Diagnosis not present

## 2017-10-11 DIAGNOSIS — I480 Paroxysmal atrial fibrillation: Secondary | ICD-10-CM | POA: Diagnosis not present

## 2017-10-11 DIAGNOSIS — Z8679 Personal history of other diseases of the circulatory system: Secondary | ICD-10-CM

## 2017-10-11 DIAGNOSIS — Z9889 Other specified postprocedural states: Secondary | ICD-10-CM

## 2017-10-11 DIAGNOSIS — I10 Essential (primary) hypertension: Secondary | ICD-10-CM

## 2017-10-11 DIAGNOSIS — E7800 Pure hypercholesterolemia, unspecified: Secondary | ICD-10-CM | POA: Diagnosis not present

## 2017-10-11 DIAGNOSIS — Z953 Presence of xenogenic heart valve: Secondary | ICD-10-CM

## 2017-10-11 DIAGNOSIS — I350 Nonrheumatic aortic (valve) stenosis: Secondary | ICD-10-CM

## 2017-10-11 MED ORDER — EZETIMIBE 10 MG PO TABS: 10.0000 mg | ORAL_TABLET | Freq: Every day | ORAL | 5 refills | Status: DC

## 2017-10-11 MED ORDER — LOSARTAN POTASSIUM 50 MG PO TABS: 50.0000 mg | ORAL_TABLET | Freq: Every day | ORAL | 3 refills | Status: DC

## 2017-10-11 NOTE — Patient Instructions (Addendum)
Medication Instructions:  INCREASE YOUR LOSARTAN TO 50 MG DAILY   START ZETIA 10 MG DAILY   Labwork: BMET NEXT WEEK   WILL GET FASTING BLOOD WORK WHEN YOU RETURN IN 3 MONTHS   Testing/Procedures: NONE  Follow-Up: Your physician recommends that you schedule a follow-up appointment in: [place]   If you need a refill on your cardiac medications before your next appointment, please call your pharmacy.

## 2017-10-12 ENCOUNTER — Telehealth: Payer: Self-pay | Admitting: Cardiovascular Disease

## 2017-10-12 NOTE — Telephone Encounter (Signed)
Routed to Jenkins, LPN  Per patient's AVS from 10/11/17 - patient was advised to START zetia 10mg 

## 2017-10-12 NOTE — Telephone Encounter (Signed)
Noted  

## 2017-10-12 NOTE — Telephone Encounter (Signed)
New message   Patient states that she was suppose to let Oval Linsey know that she will be taking ezetimibe (ZETIA) 10 MG tablet Please call

## 2017-10-13 ENCOUNTER — Encounter: Payer: Self-pay | Admitting: Cardiovascular Disease

## 2017-10-19 ENCOUNTER — Other Ambulatory Visit: Payer: Self-pay

## 2017-10-19 DIAGNOSIS — I10 Essential (primary) hypertension: Secondary | ICD-10-CM | POA: Diagnosis not present

## 2017-10-19 LAB — BASIC METABOLIC PANEL
BUN / CREAT RATIO: 18 (ref 12–28)
BUN: 13 mg/dL (ref 8–27)
CHLORIDE: 100 mmol/L (ref 96–106)
CO2: 29 mmol/L (ref 20–29)
CREATININE: 0.72 mg/dL (ref 0.57–1.00)
Calcium: 9.4 mg/dL (ref 8.7–10.3)
GFR calc Af Amer: 91 mL/min/{1.73_m2} (ref >59)
GFR calc non Af Amer: 79 mL/min/{1.73_m2} (ref >59)
GLUCOSE: 98 mg/dL (ref 65–99)
POTASSIUM: 3.8 mmol/L (ref 3.5–5.2)
SODIUM: 144 mmol/L (ref 134–144)

## 2017-10-24 ENCOUNTER — Ambulatory Visit: Payer: Medicare Other | Admitting: Thoracic Surgery (Cardiothoracic Vascular Surgery)

## 2017-10-27 ENCOUNTER — Ambulatory Visit
Admission: RE | Admit: 2017-10-27 | Discharge: 2017-10-27 | Disposition: A | Discharge status: Home / Self Care | Payer: Medicare Other | Source: Ambulatory Visit | Attending: Family Medicine | Admitting: Family Medicine

## 2017-10-27 DIAGNOSIS — Z1231 Encounter for screening mammogram for malignant neoplasm of breast: Secondary | ICD-10-CM | POA: Diagnosis not present

## 2017-10-27 DIAGNOSIS — Z139 Encounter for screening, unspecified: Secondary | ICD-10-CM

## 2017-10-29 ENCOUNTER — Other Ambulatory Visit: Payer: Self-pay | Admitting: Thoracic Surgery (Cardiothoracic Vascular Surgery)

## 2017-10-31 ENCOUNTER — Ambulatory Visit (INDEPENDENT_AMBULATORY_CARE_PROVIDER_SITE_OTHER): Payer: Medicare Other | Admitting: General Practice

## 2017-10-31 DIAGNOSIS — I4891 Unspecified atrial fibrillation: Secondary | ICD-10-CM | POA: Diagnosis not present

## 2017-10-31 LAB — POCT INR: INR: 2.3

## 2017-10-31 NOTE — Patient Instructions (Addendum)
Pre visit review using our clinic review tool, if applicable. No additional management support is needed unless otherwise documented below in the visit note.  Continue to start taking 1 tablet every day.  See patient instructions.  Re-check on 2/25.  2/13 - Last dose of coumadin until after colonoscopy  2/18 - Procedure  2/19 - Resume coumadin and take 1 1/2 tablets for 2 days (2/19 and 2/20) and then resume 1 tablet daily.

## 2017-11-01 ENCOUNTER — Other Ambulatory Visit: Payer: Self-pay | Admitting: Family Medicine

## 2017-11-02 ENCOUNTER — Other Ambulatory Visit: Payer: Self-pay | Admitting: General Practice

## 2017-11-02 ENCOUNTER — Other Ambulatory Visit: Payer: Self-pay | Admitting: Family Medicine

## 2017-11-02 MED ORDER — WARFARIN SODIUM 2 MG PO TABS: ORAL_TABLET | ORAL | 0 refills | Status: DC

## 2017-11-14 ENCOUNTER — Encounter: Payer: Self-pay | Admitting: Family Medicine

## 2017-11-14 DIAGNOSIS — Z86010 Personal history of colonic polyps: Secondary | ICD-10-CM | POA: Diagnosis not present

## 2017-11-14 DIAGNOSIS — K621 Rectal polyp: Secondary | ICD-10-CM | POA: Diagnosis not present

## 2017-11-16 DIAGNOSIS — K621 Rectal polyp: Secondary | ICD-10-CM | POA: Diagnosis not present

## 2017-11-21 ENCOUNTER — Telehealth: Payer: Self-pay | Admitting: Family Medicine

## 2017-11-21 ENCOUNTER — Ambulatory Visit (INDEPENDENT_AMBULATORY_CARE_PROVIDER_SITE_OTHER): Payer: Medicare Other | Admitting: General Practice

## 2017-11-21 ENCOUNTER — Telehealth: Payer: Self-pay | Admitting: Cardiovascular Disease

## 2017-11-21 DIAGNOSIS — I4891 Unspecified atrial fibrillation: Secondary | ICD-10-CM | POA: Diagnosis not present

## 2017-11-21 LAB — POCT INR: INR: 1.5

## 2017-11-21 MED ORDER — CLINDAMYCIN HCL 300 MG PO CAPS: ORAL_CAPSULE | ORAL | 0 refills | Status: DC

## 2017-11-21 NOTE — Telephone Encounter (Signed)
Patient was here in office today and requesting antibiotic for dental prophylaxis. She's had previous aortic valve surgery. She is getting ready to have her teeth cleaned. Allergic to penicillin. Clindamycin 600 mg one hour prior to procedure

## 2017-11-21 NOTE — Telephone Encounter (Signed)
   Primary Cardiologist: [name], MD  Chart reviewed as part of pre-operative protocol coverage. Given past medical history and time since last visit, based on ACC/AHA guidelines, DESTYNE GOODREAU would be at acceptable risk for the planned procedure without further cardiovascular testing.   She does need SBE with Clindamycin 600 mg 30 to 60 minutes prior to procedure.    I will route this recommendation to the requesting party via Epic fax function and remove from pre-op pool.  Please call with questions.  Truitt Merle, NP 11/21/2017, 3:25 PM

## 2017-11-21 NOTE — Patient Instructions (Addendum)
Pre visit review using our clinic review tool, if applicable. No additional management support is needed unless otherwise documented below in the visit note.  Take 1 1/2 tablets today and tomorrow and then resume taking 1 tablet daily.  Re-check in 2 weeks.

## 2017-11-21 NOTE — Telephone Encounter (Signed)
1. What dental office are you calling from? [facility]   2. What is your office phone and fax number? Office # [phone]   Fax [phone]  3. What type of procedure is the patient having performed? Teeth Cleaning   4. What date is procedure scheduled or is the patient there now? 11-22-14   5. What is your question (ex. Antibiotics prior to procedure, holding medication-we need to know how long dentist wants pt to hold med)? Does patient need pre-med?

## 2017-11-23 ENCOUNTER — Other Ambulatory Visit: Payer: Self-pay | Admitting: *Deleted

## 2017-11-23 NOTE — Telephone Encounter (Signed)
[place]  [phone] Refill request  diazepam (VALIUM) 5 MG tablet Last refill 10/03/17  "patient has been taking 1 whole pill daily.  Only thing to help her sleep because of leg pain.  Can't take medication for neuropathy".  Okay to fill?

## 2017-11-23 NOTE — Telephone Encounter (Signed)
Attempted to call pharmacy, but unable to leave message that refill is okay. Will try again at a different time

## 2017-11-23 NOTE — Telephone Encounter (Signed)
Refill with 5 additional refills. 

## 2017-11-24 MED ORDER — DIAZEPAM 5 MG PO TABS: ORAL_TABLET | ORAL | 5 refills | Status: DC

## 2017-11-24 NOTE — Telephone Encounter (Signed)
I called in script to [facility], went with previous directions 1/2 tablet as needed.

## 2017-11-29 DIAGNOSIS — B351 Tinea unguium: Secondary | ICD-10-CM | POA: Diagnosis not present

## 2017-11-29 DIAGNOSIS — M79676 Pain in unspecified toe(s): Secondary | ICD-10-CM | POA: Diagnosis not present

## 2017-12-05 ENCOUNTER — Other Ambulatory Visit: Payer: Self-pay | Admitting: Family Medicine

## 2017-12-05 ENCOUNTER — Ambulatory Visit (INDEPENDENT_AMBULATORY_CARE_PROVIDER_SITE_OTHER): Payer: Medicare Other | Admitting: General Practice

## 2017-12-05 DIAGNOSIS — I4891 Unspecified atrial fibrillation: Secondary | ICD-10-CM | POA: Diagnosis not present

## 2017-12-05 LAB — POCT INR: INR: 2

## 2017-12-05 NOTE — Patient Instructions (Addendum)
Pre visit review using our clinic review tool, if applicable. No additional management support is needed unless otherwise documented below in the visit note.  Continue to start taking 1 tablet every day.  Re-check in 4 weeks.

## 2017-12-13 ENCOUNTER — Ambulatory Visit (INDEPENDENT_AMBULATORY_CARE_PROVIDER_SITE_OTHER): Payer: Medicare Other | Admitting: Family Medicine

## 2017-12-13 ENCOUNTER — Encounter: Payer: Self-pay | Admitting: Family Medicine

## 2017-12-13 VITALS — BP 130/78 | HR 74 | Temp 98.7°F | Wt 149.9 lb

## 2017-12-13 DIAGNOSIS — I10 Essential (primary) hypertension: Secondary | ICD-10-CM

## 2017-12-13 DIAGNOSIS — F5104 Psychophysiologic insomnia: Secondary | ICD-10-CM | POA: Diagnosis not present

## 2017-12-13 DIAGNOSIS — R5383 Other fatigue: Secondary | ICD-10-CM

## 2017-12-13 LAB — CBC WITH DIFFERENTIAL/PLATELET
BASOS PCT: 0.7 % (ref 0.0–3.0)
Basophils Absolute: 0.1 10*3/uL (ref 0.0–0.1)
EOS PCT: 1.3 % (ref 0.0–5.0)
Eosinophils Absolute: 0.1 10*3/uL (ref 0.0–0.7)
HEMATOCRIT: 40.6 % (ref 36.0–46.0)
HEMOGLOBIN: 14.4 g/dL (ref 12.0–15.0)
LYMPHS PCT: 24 % (ref 12.0–46.0)
Lymphs Abs: 1.8 10*3/uL (ref 0.7–4.0)
MCHC: 35.5 g/dL (ref 30.0–36.0)
MCV: 89.5 fl (ref 78.0–100.0)
MONOS PCT: 6.9 % (ref 3.0–12.0)
Monocytes Absolute: 0.5 10*3/uL (ref 0.1–1.0)
Neutro Abs: 4.9 10*3/uL (ref 1.4–7.7)
Neutrophils Relative %: 67.1 % (ref 43.0–77.0)
Platelets: 270 10*3/uL (ref 150.0–400.0)
RBC: 4.54 Mil/uL (ref 3.87–5.11)
RDW: 13 % (ref 11.5–15.5)
WBC: 7.3 10*3/uL (ref 4.0–10.5)

## 2017-12-13 LAB — BASIC METABOLIC PANEL
BUN: 12 mg/dL (ref 6–23)
CHLORIDE: 99 meq/L (ref 96–112)
CO2: 33 mEq/L — ABNORMAL HIGH (ref 19–32)
Calcium: 10 mg/dL (ref 8.4–10.5)
Creatinine, Ser: 0.85 mg/dL (ref 0.40–1.20)
GFR: 68.32 mL/min (ref >60.00)
Glucose, Bld: 102 mg/dL — ABNORMAL HIGH (ref 70–99)
Potassium: 3.5 mEq/L (ref 3.5–5.1)
SODIUM: 141 meq/L (ref 135–145)

## 2017-12-13 NOTE — Progress Notes (Signed)
Subjective:     Patient ID: Catherine [name], female   DOB: September 17, 1935, 83 y.o.   MRN: [id number]  HPI Patient complains of fatigue. She states Saturday she developed some headache, body aches, sore throat. No nasal congestion. No confirmed fever. No cough. She had episode yesterday of subjective tachycardia ("heart racing"). She did not take her pulse. She's states she feels at times her heart is racing including today in the office when her pulse palpated is normal rate and regular. She has not had any chest pain. She's had previous aortic valve replacement.  Appetite and weight are stable. She is concerned because she states her father died of leukemia. She is specifically requesting CBC in view of her recent fatigue issues. She's had normal TSH in the past year. Struggled with poor sleep for years and takes low-dose diazepam. We have tried tapering her off completely but she's been very reluctant  Hypertension is been well controlled. She remains on Maxzide, losartan, and Lasix 40 mg daily  Past Medical History:  Diagnosis Date  . Aortic stenosis, mild 07/26/2017  . Arthritis   . Asthma    last attack 02/2015  . Atrial fibrillation, chronic ([place])   . Cellulitis of left lower extremity 08/17/2016   Ulcer associated with severe venous insufficiency  . Chronic anticoagulation   . Chronic diastolic CHF (congestive heart failure) ([place])   . Chronic kidney disease    "RIGHT MANY KIDNEY INFECTIONS AND STONES"  . COPD (chronic obstructive pulmonary disease) ([place])   . Coronary artery disease   . Dizziness   . H/O: rheumatic fever   . Heart murmur   . Hypertension   . PONV (postoperative nausea and vomiting)    ' SOMETIMES', BUT NOT ALWAYS"  . S/P [place] operation for atrial fibrillation 10/14/2016   Complete bilateral atrial lesion set using cryothermy and bipolar radiofrequency ablation - atrial appendage was not treated due to previous surgical procedure (open mitral commissurotomy)  . S/P  mitral valve replacement with bioprosthetic valve 10/14/2016   29 mm Medtronic Mosaic porcine bioprosthetic tissue valve  . UTI (urinary tract infection)   . Valvular heart disease    Has mitral stenosis with prior mitral commissurotomy in 1970   Past Surgical History:  Procedure Laterality Date  . ABDOMINAL HYSTERECTOMY  1983   endometriosis  . APPENDECTOMY    . BACK SURGERY     neurosurgery x2  . CARDIAC CATHETERIZATION    . CARDIAC CATHETERIZATION N/A 08/03/2016   Procedure: Right/Left Heart Cath and Coronary Angiography;  Surgeon: Peter M Martinique, MD;  Location: [facility];  Service: Cardiovascular;  Laterality: N/A;  . cataract surg    . CHOLECYSTECTOMY N/A 05/30/2015   Procedure: LAPAROSCOPIC CHOLECYSTECTOMY WITH INTRAOPERATIVE CHOLANGIOGRAM;  Surgeon: Excell Seltzer, MD;  Location: [facility];  Service: General;  Laterality: N/A;  . COLONOSCOPY    . CORONARY ARTERY BYPASS GRAFT N/A 10/14/2016   Procedure: CORONARY ARTERY BYPASS GRAFTING (CABG);  Surgeon: Rexene Alberts, MD;  Location: [facility];  Service: Open Heart Surgery;  Laterality: N/A;  . EYE SURGERY    . MAZE N/A 10/14/2016   Procedure: MAZE;  Surgeon: Rexene Alberts, MD;  Location: [facility];  Service: Open Heart Surgery;  Laterality: N/A;  . MITRAL VALVE REPLACEMENT N/A 10/14/2016   Procedure: REDO MITRAL VALVE REPLACEMENT (MVR);  Surgeon: Rexene Alberts, MD;  Location: [facility];  Service: Open Heart Surgery;  Laterality: N/A;  . MITRAL VALVE SURGERY Left 1970  Open mitral commissurotomy via left thoracotomy approach  . TEE WITHOUT CARDIOVERSION N/A 07/05/2016   Procedure: TRANSESOPHAGEAL ECHOCARDIOGRAM (TEE);  Surgeon: [name], MD;  Location: [facility];  Service: Cardiovascular;  Laterality: N/A;  . TEE WITHOUT CARDIOVERSION N/A 10/14/2016   Procedure: TRANSESOPHAGEAL ECHOCARDIOGRAM (TEE);  Surgeon: Rexene Alberts, MD;  Location: [facility];  Service: Open Heart Surgery;  Laterality: N/A;    reports that she quit  smoking about 42 years ago. Her smoking use included cigarettes. She has a 15.00 pack-year smoking history. she has never used smokeless tobacco. She reports that she does not drink alcohol or use drugs. family history includes Leukemia in her father. Allergies  Allergen Reactions  . Aldactone [Spironolactone] Other (See Comments)    dyspnea  . Amoxicillin Palpitations    Tachycardia Has patient had a PCN reaction causing immediate rash, facial/tongue/throat swelling, SOB or lightheadedness with hypotension: no Has patient had a PCN reaction causing severe rash involving mucus membranes or skin necrosis: {no Has patient had a PCN reaction that required hospitalization {no Has patient had a PCN reaction occurring within the last 10 years: {yes If all of the above answers are "NO", then may proceed with Cephalosporin use.  . Diltiazem Other (See Comments)    Causing headaches   . Flagyl [Metronidazole Hcl] Other (See Comments)    Causing headaches    . Flovent [Fluticasone Propionate] Other (See Comments)    Leg cramps  . Gabapentin Swelling  . Lyrica [Pregabalin] Swelling  . Quinidine Diarrhea and Other (See Comments)    Fever diarrhea  . Simvastatin Other (See Comments)    Leg pain, myalgia  . Tramadol Nausea Only  . Verapamil Other (See Comments)    myalgias  . Amlodipine     Low extremity edema  . Ace Inhibitors Other (See Comments)    unknown  . Benazepril Hcl Cough  . Ciprocin-Fluocin-Procin [Fluocinolone Acetonide] Other (See Comments)    unknown  . Ciprofloxacin Diarrhea  . Codeine Nausea Only  . Nitrofurantoin Monohyd Macro Nausea Only     Review of Systems  Constitutional: Positive for fatigue.  HENT: Negative for congestion.   Respiratory: Negative for cough and shortness of breath.   Cardiovascular: Positive for palpitations. Negative for chest pain and leg swelling.  Genitourinary: Negative for dysuria.  Neurological: Negative for dizziness and syncope.   Psychiatric/Behavioral: Negative for confusion and dysphoric mood. The patient is nervous/anxious.        Objective:   Physical Exam  Constitutional: She is oriented to person, place, and time. She appears well-developed and well-nourished.  Neck: Neck supple.  Cardiovascular: Normal rate and regular rhythm.  Pulmonary/Chest: Effort normal and breath sounds normal. No respiratory distress. She has no wheezes.  Abdominal: Soft. Bowel sounds are normal. She exhibits no distension. There is no tenderness.  Musculoskeletal: She exhibits no edema.  Neurological: She is alert and oriented to person, place, and time.       Assessment:     #1 subjective palpitations. Patient states she felt exactly like this previously when she had hypokalemia. She denies any muscle cramps  #2 nonspecific fatigue  #3 hypertension stable and at goal  #4 chronic insomnia    Plan:     -Recheck basic metabolic panel and CBC -Discussed sleep hygiene. -avoid escalation in Diazepam. -Continue to minimize caffeine intake  [name] MD [facility]

## 2017-12-16 ENCOUNTER — Telehealth: Payer: Self-pay | Admitting: Cardiovascular Disease

## 2017-12-16 ENCOUNTER — Telehealth: Payer: Self-pay | Admitting: Internal Medicine

## 2017-12-16 MED ORDER — PREDNISONE 10 MG PO TABS: ORAL_TABLET | ORAL | 0 refills | Status: DC

## 2017-12-16 NOTE — Telephone Encounter (Signed)
New Message   Pt c/o Shortness Of Breath: STAT if SOB developed within the last 24 hours or pt is noticeably SOB on the phone  1. Are you currently SOB (can you hear that pt is SOB on the phone)? She says yes, on the phone she just sounds mildly out of breath  2. How long have you been experiencing SOB? Off and on all week   3. Are you SOB when sitting or when up moving around? Does not matter if she is sitting or moving   4. Are you currently experiencing any other symptoms? No

## 2017-12-16 NOTE — Telephone Encounter (Signed)
Pt c/o increased fatigue, sob, dizziness X2 days.   Denies fever, chest pain, mucus production.   Has been using proair which helps with sob.  Pt also uses Symbicort.  Pt had called cardiologist who advised her to call our office since albuterol is helping with SOB.  I advised pt that we have not seen her since 07/2016 and have no availability today.  I advised pt to call PCP, who she saw on Tuesday.  Pt still requesting recs from our office if possible.  Sending to DOD as MW is unavailable.  MR please advise.  Thanks.

## 2017-12-16 NOTE — Telephone Encounter (Signed)
She saw ppc [name], MD 12/13/17 for fatigue She called cardiology 12/16/2017 for dyspnea for a week and then asked to call here She sees Dr [name] for gold stge 2 copd - last seen nov 2017   Even if albuterol helps her symptoms are NOT c/w pulmpary issue like pna or aecopd Cannot be PE because she is on coumadin  Plan . She needs evaluation and sorry replying at 3.15pm on a Friday for this   I can offer Please take prednisone 40 mg x1 day, then 30 mg x1 day, then 20 mg x1 day, then 10 mg x1 day, and then 5 mg x1 day and stop   - but I will have not idea what I am treating  -= she is welcome to try  reocmmend ER if she cannot wait to be seen by [name] next week - you can offere one next week - but really she needs to go back to PCP Burchette, Alinda Sierras, MD about this   Dr. Brand Males, M.D., [facility].C.P Pulmonary and Critical Care Medicine Staff Physician, [place] Director - Interstitial Lung Disease  Program  Pulmonary [place] at [place], [place], [address]  Pager: [phone], If no answer or between  15:00h - 7:00h: call [phone] Telephone: [phone]

## 2017-12-16 NOTE — Telephone Encounter (Signed)
Pt is aware of below message and voiced her understanding.  Pt stated previously high dose of prednisone made her nervous. Pt states she is willing to try this medication. Pt has been scheduled to see MW 12/20/17. Rx for prednisone has been sent to preferred pharmacy. Nothing further is needed.   Will route to MR has FYI.

## 2017-12-16 NOTE — Telephone Encounter (Signed)
Spoke with patient and she has been having shortness of breath for off and on for several days. She has not had decreased energy and achy over the weekend. She did use her Albuterol which seemed to help. Advised patient to call her pulmonologist since Albuterol helped. Patient verbalized understanding.

## 2017-12-20 ENCOUNTER — Ambulatory Visit (INDEPENDENT_AMBULATORY_CARE_PROVIDER_SITE_OTHER)
Admission: RE | Admit: 2017-12-20 | Discharge: 2017-12-20 | Disposition: A | Discharge status: Home / Self Care | Payer: Medicare Other | Source: Ambulatory Visit | Attending: Internal Medicine | Admitting: Internal Medicine

## 2017-12-20 ENCOUNTER — Telehealth: Payer: Self-pay | Admitting: Cardiovascular Disease

## 2017-12-20 ENCOUNTER — Ambulatory Visit: Payer: Medicare Other | Admitting: Internal Medicine

## 2017-12-20 ENCOUNTER — Encounter: Payer: Self-pay | Admitting: Internal Medicine

## 2017-12-20 VITALS — BP 130/72 | HR 68 | Ht 64.0 in | Wt 149.4 lb

## 2017-12-20 DIAGNOSIS — J449 Chronic obstructive pulmonary disease, unspecified: Secondary | ICD-10-CM

## 2017-12-20 DIAGNOSIS — R0602 Shortness of breath: Secondary | ICD-10-CM | POA: Diagnosis not present

## 2017-12-20 NOTE — Progress Notes (Signed)
Subjective:     Patient ID: Catherine [name], female   DOB: 05/25/1935,   MRN: [id number]    Brief patient profile:  3  yowf quit smoking in 1977 with GOLD II criteria 6/29016  at her best can do 5 days/7 walk x one half mile 3.2 mph flat but not doing as well since spring 2016 then admitted p flare of copd with new kidney stone/GB pain    Admit date: 03/15/2015 Discharge date: 03/17/2015    Discharge Condition: stable Diet recommendation: low fat, low sodium  Discharge Diagnoses:  Principal Problem:  Left flank pain/ Nephrolithiasis  Right upper quadrant pain-chronic cholecystitis  Essential hypertension  Atrial fibrillation, chronic  Warfarin-induced coagulopathy  Valvular heart disease  COPD (chronic obstructive pulmonary disease)  History of present illness:  -83 year old female who was discharged from the hospital on 6/17 after being treated for a COPD exacerbation. She returned to the hospital on 6/18 with severe left-sided flank pain. The pain eventually moved to left anterior abdomen and then the groin and then resolved. A CT scan of the abdomen and pelvis was done which revealed a forniceal rupture and a 3 mm calculus at the left UV junction or just inside the bladder. This was consistent with her presentation and at this time she has likely passed the stone. The CT scan above incidentally found chronic large gallstone. Further imaging with the ultrasound revealed acute cholecystitis with a 2.6 cm gallstone and possible additional stones. The patient is admitted to intermittent right upper quadrant pain but no significant issues with vomiting or diarrhea.  Hospital Course:  Principal Problem:  Acute cholecystitis? -As mentioned above, CT scan revealed a large gallstone and ultrasound was suspicious of acute cholecystitis -HIDA negative for acute cholecystitis therefore she does not currently have acute cholecystitis however surgery does suspect she has a chronic  cholecystitis as she does complain of having intermittent right upper quadrant pain for the past couple of months -Surgery (Dr Excell Seltzer) recommending surgery at a later date for chronic cholecystitis and is asking for medical clearance for surgery  Active Problems: Left sided nephrolithiasis with forniceal rupture-discussed with urology -no further management necessary as her pain has resolved indicating passage of the stone  COPD flare -Was admitted for this and discharged on the 17th -has not been hypoxic or wheezing during the hospital stay but continues to have a cough with yellow sputum   Essential hypertension -Atenolol and Maxzide held on admission as she was bradycardic and has acute renal failure - ARF resolved- can resume Losartan   Atrial fibrillation, chronic -she was bradycardic-Atenolol was on hold - HR was in 50-60 range- had a-fib with HR in low 100s for almost 5 min this AM- will resume Atenolol at 12.5 mg daily- cont to hold Digoxin   Warfarin-induced coagulopathy -CHADS Vasc 4 -INR 3.57 on admission - Coumadin was hold for possible surgery- INR 1.49- no surgery planned for this admission- can go back on coumadin takes 2 mg on add days except Wed when she takes 1mg - advised to go back to 2 mg daily and have INR checked in 3 days by PCP   Moderate to severe mitral regurgitation -Unchanged from prior echo in 2015   03/24/2015 1st [facility] office visit/ [name] / pre op consultation  Chief Complaint  Patient presents with  . HFU    Breathing has improved. Her cough has improved some, but she still occ coughs up yellow sputum.   at baseline using saba twice  weekly then yardwrok on   03/07/15  then hackiing 6/11 and admit   03/11/15 with copd/ab  Still some am cough but otherwise back to baseline on symb 80 2bid  Not limited by breathing from desired activities  rec Continue symbicort 80 Take 2 puffs first thing in am and then another 2 puffs about 12 hours later  indefinitely since you had an attack and proair didn't help Only use your albuterol (proair) as a rescue medication You are cleared for gall bladder surgery > did  Fine     07/29/2016  Pulmonary consultation /Alexica Schlossberg re: unexplained sob in pt with MS/ GOLD II criteria copd  Chief Complaint  Patient presents with  . Pulmonary Consult    Referred by Dr. Oval Linsey. Pt states that she is going to have mitral valve replacement soon. She c/o increased SOB since her last visit in 2016. She gets SOB doing housework.     progressive doe x year not able to trigger hfa effectively (at least a one second delay)  Previously able to do half mile on a treadmill  X  3.5 mph and flat now  2.2 x 0.2 of a mile then stops due to sob occ orthopnea/ maybe once a month Always recovers within a few minutes   rec Plan A = Automatic = Symbicort 80 Take 2 puffs first thing in am and then another 2 puffs about 12 hours later.  Work on inhaler technique:  - late add  If really needs hfa may be required to use spacer as could never trigger the mdi at onset of insp, only > 1 sec prior Plan B = Backup Only use your albuterol as rescue    Oct 14 2016  MVR [name]> marked improvement in doe   Phone call  12/16/17 Dr MR She saw ppc [name], MD 12/13/17 for fatigue She called cardiology 12/16/2017 for dyspnea for a week and then asked to call here  Please take prednisone 40 mg x1 day, then 30 mg x1 day, then 20 mg x1 day, then 10 mg x1 day, and then 5 mg x1 day and stop   -     12/20/2017 acute extended ov/Lelah Rennaker re:  ? aecopd  Chief Complaint  Patient presents with  . Acute Visit    Increased SOB since 12/10/17.  She states she gets SOB "anytime" sometimes with just talking. She was prescribe pred taper and this helped some.   sob at rest s relief from albuterol  "like my insides are quivering"  - symptoms better with walking (see walk today)  Some better with prednisone No cough/ some sensation of strangling on  water  Changed from full diazepam 5 mg at hs to one half around 3 weeks prior to onset of "quivering" and has really not slept well at all since this change was made ? Around 11/24/17  - not having noct cough / wheeze or early am exac  No obvious day to day or daytime variability or assoc excess/ purulent sputum or mucus plugs or hemoptysis or cp or chest tightness, subjective wheeze or overt sinus or hb symptoms. No unusual exposure hx or h/o childhood pna/ asthma or knowledge of premature birth.   . Also denies any obvious fluctuation of symptoms with weather or environmental changes or other aggravating or alleviating factors except as outlined above   Current Allergies, Complete Past Medical History, Past Surgical History, Family History, and Social History were reviewed in [employer]  record.  ROS  The following are not active complaints unless bolded Hoarseness, sore throat, dysphagia, dental problems, itching, sneezing,  nasal congestion or discharge of excess mucus or purulent secretions, ear ache,   fever, chills, sweats, unintended wt loss or wt gain, classically pleuritic or exertional cp,  orthopnea pnd or leg swelling, presyncope, palpitations, abdominal pain, anorexia, nausea, vomiting, diarrhea  or change in bowel habits or change in bladder habits, change in stools or change in urine, dysuria, hematuria,  rash, arthralgias, visual complaints, headache, numbness, weakness or ataxia or problems with walking or coordination,  change in mood/affect or memory.        Current Meds  Medication Sig  . acetaminophen (TYLENOL) 500 MG tablet Take 500 mg by mouth every 6 (six) hours as needed for mild pain.  Marland Kitchen albuterol (PROVENTIL HFA;VENTOLIN HFA) 108 (90 Base) MCG/ACT inhaler Inhale 2 puffs into the lungs every 6 (six) hours as needed for wheezing or shortness of breath.  Marland Kitchen aspirin EC 81 MG EC tablet Take 1 tablet (81 mg total) by mouth daily.  . diazepam (VALIUM) 5 MG  tablet Take 1/2 tab at bedtime as needed  . furosemide (LASIX) 40 MG tablet Take 40 mg by mouth daily. Patient takes 1 tablet twice daily if needed for swelling  . losartan (COZAAR) 50 MG tablet Take 1 tablet (50 mg total) by mouth daily.  Marland Kitchen OVER THE COUNTER MEDICATION Apply 1 application topically at bedtime as needed (Leg Pain). Magni-Life topical cream for leg pain.  . potassium chloride (K-DUR,KLOR-CON) 10 MEQ tablet Take 1 tablet (10 mEq total) by mouth 2 (two) times daily. Take an extra 2 tablets on days you take extra Furosemide  . SYMBICORT 80-4.5 MCG/ACT inhaler 2 PUFFS 2 TIMES A DAY  . triamterene-hydrochlorothiazide (MAXZIDE-25) 37.5-25 MG tablet Take 1 tablet by mouth daily.  Marland Kitchen warfarin (COUMADIN) 2 MG tablet TAKE 1 TABLET ONCE DAILY AT 6PM, OR AS DIRECTED BY COUMADIN CLINIC                Objective:   Physical Exam   amb wf tremulous and very anxious . Evasive with questions focusing entirely on severity in all answers to questions re timing and pattern of symptoms    12/20/2017        149   07/29/2016       151  03/24/15 139 lb 12.8 oz (63.413 kg)  03/20/15 141 lb (63.957 kg)  03/16/15 143 lb (64.864 kg)      HEENT: nl dentition, turbinates bilaterally, and oropharynx. Nl external ear canals without cough reflex   NECK :  without JVD/Nodes/TM/ nl carotid upstrokes bilaterally   LUNGS: no acc muscle use,  Nl contour chest which is clear to A and P bilaterally without cough on insp or exp maneuvers   CV:  RRR  no s3 or murmur or increase in P2, and no edema   ABD:  soft and nontender with nl inspiratory excursion in the supine position. No bruits or organomegaly appreciated, bowel sounds nl  MS:  Nl gait/ ext warm without deformities, calf tenderness, cyanosis or clubbing No obvious joint restrictions   SKIN: warm and dry without lesions    NEURO:  alert, approp, nl sensorium with  no motor or cerebellar deficits apparent.         CXR PA and Lateral:    12/20/2017 :    I personally reviewed images and agree with radiology impression as follows:    no acute  changes         Assessment:

## 2017-12-20 NOTE — Patient Instructions (Addendum)
Try back on a whole diazepam now and one at bedtime just for a week to see to what extent your problem improves and if worsens again on the half pill after that and if so return to see Dr Elease Hashimoto and if not return here with all meds in hand   Plan A = Automatic = symbicort 80 Take 2 puffs first thing in am and then another 2 puffs about 12 hours later.  Try adding prilosec otc 20mg   Take 30-60 min before first meal of the day and Pepcid ac (famotidine) 20 mg one @  bedtime  X 4 week trial   GERD (REFLUX)  is an extremely common cause of respiratory symptoms just like yours , many times with no obvious heartburn at all.    It can be treated with medication, but also with lifestyle changes including elevation of the head of your bed (ideally with 6 inch  bed blocks),  Smoking cessation, avoidance of late meals, excessive alcohol, and avoid fatty foods, chocolate, peppermint, colas, red wine, and acidic juices such as orange juice.  NO MINT OR MENTHOL PRODUCTS SO NO COUGH DROPS  USE SUGARLESS CANDY INSTEAD (Jolley ranchers or Stover's or Life Savers) or even ice chips will also do - the key is to swallow to prevent all throat clearing. NO OIL BASED VITAMINS - use powdered substitutes.        Plan B = Backup Only use your albuterol as a rescue medication to be used if you can't catch your breath by resting or doing a relaxed purse lip breathing pattern.  - The less you use it, the better it will work when you need it. - Ok to use the inhaler up to 2 puffs  every 4 hours if you must but call for appointment if use goes up over your usual need - Don't leave home without it !!  (think of it like the spare tire for your car)    Please remember to go to the  x-ray department downstairs in the basement  for your tests - we will call you with the results when they are available.

## 2017-12-20 NOTE — Telephone Encounter (Signed)
Received incoming records from [facility] for upcoming appointment on 01/05/18 @ 10:20am with Dr. Oval Linsey. Records given to [facility] in Medical Records. 12/20/17 ab

## 2017-12-21 ENCOUNTER — Encounter: Payer: Self-pay | Admitting: Internal Medicine

## 2017-12-21 ENCOUNTER — Telehealth: Payer: Self-pay | Admitting: Internal Medicine

## 2017-12-21 ENCOUNTER — Telehealth: Payer: Self-pay | Admitting: *Deleted

## 2017-12-21 NOTE — Telephone Encounter (Signed)
Notes recorded by Tanda Rockers, MD on 12/21/2017 at 9:03 AM EDT Call pt: Reviewed cxr and no acute change so no change in recommendations made at [facility]  ATC patient, she did not answer. VM is not setup. Will try again later.

## 2017-12-21 NOTE — Telephone Encounter (Signed)
Left message on machine for patient.  Patient should schedule a 30 minute appointment to discuss her medications.  CRM created

## 2017-12-21 NOTE — Telephone Encounter (Signed)
-----   Message from [name], MD sent at 12/21/2017  7:40 AM EDT ----- Apolonio Schneiders,  Will you re-schedule her for follow up appt.  Want to discuss her anxiety management further. ----- Message ----- From: Tanda Rockers, MD Sent: 12/21/2017   6:33 AM To: [name], MD  I wasn't sure what to do with her but if going back on the full dose of benzo resolves the problem then at least we'll know what not to do (big cardiopulmonary w/u) and allow [name] to focus on what I think is nothing more than panic disorder exac by reduction in benzo's - though I fully support searching for alternative to chronic valium rx   thx!

## 2017-12-21 NOTE — Progress Notes (Signed)
LMTCB

## 2017-12-21 NOTE — Assessment & Plan Note (Addendum)
- Quit smoking 1977 - Spirometry 03/24/15  FEV1  1.41 (70%) ratio 65  - 03/26/2015  Walked RA x 3 laps @ 185 ft each stopped due to  End of study, brisk pace, no sob or desat  - Spirometry 07/29/2016  FEV1 1.30 (66%)  Ratio 66     - Spirometry 12/20/2017  FEV1 1.34 (69%)  Ratio 71 with min curvature p am symb 80 and saba w/in 1 hour - 12/20/2017  Walked RA x 3 laps @ 185 ft each stopped due to  End of study, moderately fast pace, no sob or desat     - 12/20/2017  After extensive coaching inhaler device  effectiveness =    75%  (Ti short)    Symptoms are markedly disproportionate to objective findings and not clear to what extent this is actually a pulmonary  problem but pt does appear to have difficult to sort out respiratory symptoms of unknown origin for which  DDX  = almost all start with A and  include Adherence, Ace Inhibitors, Acid Reflux, Active Sinus Disease, Alpha 1 Antitripsin deficiency, Anxiety masquerading as Airways dz,  ABPA,  Allergy(esp in young), Aspiration (esp in elderly), Adverse effects of meds,  Active smokers, A bunch of PE's/clot burden (a few small clots can't cause this syndrome unless there is already severe underlying pulm or vascular dz with poor reserve),  Anemia or thyroid disorder, plus two Bs  = Bronchiectasis and Beta blocker use..and one C= CHF     Adherence is always the initial "prime suspect" and is a multilayered concern that requires a "trust but verify" approach in every patient - starting with knowing how to use medications, especially inhalers, correctly, keeping up with refills and understanding the fundamental difference between maintenance and prns vs those medications only taken for a very short course and then stopped and not refilled.  - see hfa teaching - return with all meds in hand using a trust but verify approach to confirm accurate Medication  Reconciliation The principal here is that until we are certain that the  patients are doing what we've  asked, it makes no sense to ask them to do more.    ? Anxiety /depression > usually at the bottom of this list of usual suspects but should be much higher on this pt's based on H and P and note already on psychotropics .  - symptom of sob at rest better with exertion in setting of recent reduction in benzo's is classic for panic disorder and best way to prove this is just resume prev dose, consider alternative rx but if benzo desired prefer clonazepam over valium in this setting as less metabolites to worry about and more effective for daytime sob   ? Acid (or non-acid) GERD > always difficult to exclude as up to 75% of pts in some series report no assoc GI/ Heartburn symptoms> rec max (24h)  acid suppression and diet restrictions/ reviewed and instructions given in writing.   ? Allergy / asthmatic component>  She well could have this  based on reported response to prednisone but that would not explain today's symptoms/findings > continue symb 80 2bid and prn saba   ? A bunch of PE's > not likely on coumadin  Anemia  Lab Results  Component Value Date   HGB 14.4 12/13/2017   HGB 12.9 02/01/2017   HGB 12.8 11/22/2016    thryoid dz Lab Results  Component Value Date   TSH 4.09 02/01/2017    ?  chf / MV dz >  Very unlikely based on today's walking study.    I had an extended discussion with the patient/husband reviewing all relevant studies completed to date and  lasting 25 minutes of a 40  minute acute office visit to re-establish with me   re  severe non-specific but potentially very serious refractory respiratory symptoms of uncertain and potentially multiple  etiologies.   Each maintenance medication was reviewed in detail including most importantly the difference between maintenance and prns and under what circumstances the prns are to be triggered using an action plan format that is not reflected in the computer generated alphabetically organized AVS.    Please see AVS for specific  instructions unique to this office visit that I personally wrote and verbalized to the the pt in detail and then reviewed with pt  by my nurse highlighting any changes in therapy/plan of care  recommended at today's visit.

## 2017-12-22 NOTE — Telephone Encounter (Signed)
Called and spoke with pt letting her know the results of her cxr.  Pt expressed understanding.  Pt wanted me to make MW aware that pt did take the whole pill as MW suggested at her OV and pt stated when she did that, she was able to get a good night's rest after that.  Routing message to MW as an Pharmacist, hospital.

## 2017-12-23 ENCOUNTER — Encounter: Payer: Self-pay | Admitting: Family Medicine

## 2017-12-23 ENCOUNTER — Ambulatory Visit (INDEPENDENT_AMBULATORY_CARE_PROVIDER_SITE_OTHER): Payer: Medicare Other | Admitting: Family Medicine

## 2017-12-23 VITALS — BP 122/72 | HR 74 | Temp 98.6°F | Ht 64.0 in | Wt 149.1 lb

## 2017-12-23 DIAGNOSIS — R0600 Dyspnea, unspecified: Secondary | ICD-10-CM | POA: Diagnosis not present

## 2017-12-23 DIAGNOSIS — F419 Anxiety disorder, unspecified: Secondary | ICD-10-CM | POA: Insufficient documentation

## 2017-12-23 MED ORDER — SERTRALINE HCL 50 MG PO TABS: 50.0000 mg | ORAL_TABLET | Freq: Every day | ORAL | 3 refills | Status: DC

## 2017-12-23 MED ORDER — DIAZEPAM 5 MG PO TABS: 5.0000 mg | ORAL_TABLET | Freq: Every day | ORAL | 5 refills | Status: DC

## 2017-12-23 NOTE — Progress Notes (Signed)
Subjective:     Patient ID: Catherine [name], female   DOB: [birth year], 83 y.o.   MRN: [id number]  HPI Patient has chronic problems including history of mitral stenosis, hypertension, CAD, atrial fibrillation, COPD, chronic anxiety. She also has history chronic insomnia. She has been having intermittent episodes of dyspnea. She was seen by pulmonary and they did not feel like this was pulmonary related. She's not any recent chest pains.   She had been for years on diazepam 5 mg daily at bedtime and several months ago we tried reducing this back to 2.5  Mg in effort to reduce risk of falls and other complications of long-acting benzodiazepines in elderly. Since that time patient had more frequent anxiety symptoms. She has what sounds like discrete episodes of anxiety. No caffeine use. She has difficulty sometimes getting a deep breath and feels very anxious sometimes for several hours. Denies any depression symptoms.  Her anxiety symptoms seem to cluster sometimes over a period of several weeks. She's noticed sometimes her shortness of breath seems to actually improve with walking  Past Medical History:  Diagnosis Date  . Aortic stenosis, mild 07/26/2017  . Arthritis   . Asthma    last attack 02/2015  . Atrial fibrillation, chronic ([place])   . Cellulitis of left lower extremity 08/17/2016   Ulcer associated with severe venous insufficiency  . Chronic anticoagulation   . Chronic diastolic CHF (congestive heart failure) ([place])   . Chronic kidney disease    "RIGHT MANY KIDNEY INFECTIONS AND STONES"  . COPD (chronic obstructive pulmonary disease) ([place])   . Coronary artery disease   . Dizziness   . H/O: rheumatic fever   . Heart murmur   . Hypertension   . PONV (postoperative nausea and vomiting)    ' SOMETIMES', BUT NOT ALWAYS"  . S/P Maze operation for atrial fibrillation 10/14/2016   Complete bilateral atrial lesion set using cryothermy and bipolar radiofrequency ablation - atrial appendage was  not treated due to previous surgical procedure (open mitral commissurotomy)  . S/P mitral valve replacement with bioprosthetic valve 10/14/2016   29 mm Medtronic Mosaic porcine bioprosthetic tissue valve  . UTI (urinary tract infection)   . Valvular heart disease    Has mitral stenosis with prior mitral commissurotomy in 1970   Past Surgical History:  Procedure Laterality Date  . ABDOMINAL HYSTERECTOMY  1983   endometriosis  . APPENDECTOMY    . BACK SURGERY     neurosurgery x2  . CARDIAC CATHETERIZATION    . CARDIAC CATHETERIZATION N/A 08/03/2016   Procedure: Right/Left Heart Cath and Coronary Angiography;  Surgeon: Peter M Martinique, MD;  Location: [facility];  Service: Cardiovascular;  Laterality: N/A;  . cataract surg    . CHOLECYSTECTOMY N/A 05/30/2015   Procedure: LAPAROSCOPIC CHOLECYSTECTOMY WITH INTRAOPERATIVE CHOLANGIOGRAM;  Surgeon: Excell Seltzer, MD;  Location: [place] [facility];  Service: General;  Laterality: N/A;  . COLONOSCOPY    . CORONARY ARTERY BYPASS GRAFT N/A 10/14/2016   Procedure: CORONARY ARTERY BYPASS GRAFTING (CABG);  Surgeon: Rexene Alberts, MD;  Location: [facility];  Service: Open Heart Surgery;  Laterality: N/A;  . EYE SURGERY    . MAZE N/A 10/14/2016   Procedure: MAZE;  Surgeon: Rexene Alberts, MD;  Location: [place];  Service: Open Heart Surgery;  Laterality: N/A;  . MITRAL VALVE REPLACEMENT N/A 10/14/2016   Procedure: REDO MITRAL VALVE REPLACEMENT (MVR);  Surgeon: Rexene Alberts, MD;  Location: [place];  Service: Open Heart Surgery;  Laterality: N/A;  . MITRAL VALVE SURGERY Left 1970   Open mitral commissurotomy via left thoracotomy approach  . TEE WITHOUT CARDIOVERSION N/A 07/05/2016   Procedure: TRANSESOPHAGEAL ECHOCARDIOGRAM (TEE);  Surgeon: [name], MD;  Location: [place];  Service: Cardiovascular;  Laterality: N/A;  . TEE WITHOUT CARDIOVERSION N/A 10/14/2016   Procedure: TRANSESOPHAGEAL ECHOCARDIOGRAM (TEE);  Surgeon: Rexene Alberts, MD;  Location: [place];  Service: Open Heart Surgery;  Laterality: N/A;    reports that she quit smoking about 42 years ago. Her smoking use included cigarettes. She has a 15.00 pack-year smoking history. She has never used smokeless tobacco. She reports that she does not drink alcohol or use drugs. family history includes Leukemia in her father. Allergies  Allergen Reactions  . Aldactone [Spironolactone] Other (See Comments)    dyspnea  . Amoxicillin Palpitations    Tachycardia Has patient had a PCN reaction causing immediate rash, facial/tongue/throat swelling, SOB or lightheadedness with hypotension: no Has patient had a PCN reaction causing severe rash involving mucus membranes or skin necrosis: {no Has patient had a PCN reaction that required hospitalization {no Has patient had a PCN reaction occurring within the last 10 years: {yes If all of the above answers are "NO", then may proceed with Cephalosporin use.  . Diltiazem Other (See Comments)    Causing headaches   . Flagyl [Metronidazole Hcl] Other (See Comments)    Causing headaches    . Flovent [Fluticasone Propionate] Other (See Comments)    Leg cramps  . Gabapentin Swelling  . Lyrica [Pregabalin] Swelling  . Quinidine Diarrhea and Other (See Comments)    Fever diarrhea  . Simvastatin Other (See Comments)    Leg pain, myalgia  . Tramadol Nausea Only  . Verapamil Other (See Comments)    myalgias  . Amlodipine     Low extremity edema  . Ace Inhibitors Other (See Comments)    unknown  . Benazepril Hcl Cough  . Ciprocin-Fluocin-Procin [Fluocinolone Acetonide] Other (See Comments)    unknown  . Ciprofloxacin Diarrhea  . Codeine Nausea Only  . Nitrofurantoin Monohyd Macro Nausea Only     Review of Systems  Constitutional: Negative for chills and fever.  Respiratory: Positive for shortness of breath. Negative for cough and chest tightness.   Cardiovascular: Negative for chest pain, palpitations and leg swelling.  Gastrointestinal:  Negative for abdominal pain.  Psychiatric/Behavioral: Negative for confusion and dysphoric mood. The patient is nervous/anxious.        Objective:   Physical Exam  Constitutional: She appears well-developed and well-nourished.  Cardiovascular: Normal rate.  Pulmonary/Chest: Effort normal and breath sounds normal. No respiratory distress. She has no wheezes. She has no rales.  Musculoskeletal: She exhibits no edema.  Psychiatric:  Patient is alert and cooperative and somewhat anxious in appearance. She had a couple episodes during exam when discussing her symptoms that seemed to trigger some dyspnea and anxiety       Assessment:     #1 Chronic anxiety. Poorly controlled.  May have element of panic disorder versus situational. She tends to be very focused on physical symptoms  #2 Dyspnea as a predominant symptom of anxiety. She does have very mild COPD but unremarkable lung exam this time    Plan:     -We recommend trial sertraline 50 mg daily. She'll start 25 mg daily at bedtime for 3 days and then increase to 50 mg and reassess in 3 weeks -Short-term at least increase diazepam back to 5 mg daily  at bedtime -Continue to minimize caffeine intake  [name] MD [facility] at [facility]

## 2017-12-23 NOTE — Patient Instructions (Addendum)
Start the Sertraline 50 mg one half tablet at night for 3 nights and then increase to one at night.    Increase the diazepam back to one at night  Let's plan on 3 week follow up.

## 2017-12-27 ENCOUNTER — Telehealth: Payer: Self-pay | Admitting: Internal Medicine

## 2017-12-27 NOTE — Telephone Encounter (Signed)
Called and spoke with patient, she states that she is now taking a whole Valium pill as well as a whole zoloft pill that her PCP placed her on. Patient is letting Dr. [name] know.    Routing this to MW as Juluis Rainier

## 2017-12-30 NOTE — Telephone Encounter (Signed)
Routing message to MW to advise regarding below message

## 2018-01-02 ENCOUNTER — Ambulatory Visit (INDEPENDENT_AMBULATORY_CARE_PROVIDER_SITE_OTHER): Payer: Medicare Other | Admitting: General Practice

## 2018-01-02 ENCOUNTER — Ambulatory Visit: Payer: Medicare Other

## 2018-01-02 DIAGNOSIS — I4891 Unspecified atrial fibrillation: Secondary | ICD-10-CM

## 2018-01-02 LAB — POCT INR: INR: 1.7

## 2018-01-02 NOTE — Patient Instructions (Addendum)
Pre visit review using our clinic review tool, if applicable. No additional management support is needed unless otherwise documented below in the visit note.  Take 2 tablets and then continue to take 1 tablet every day.  Re-check in 4 weeks.

## 2018-01-03 ENCOUNTER — Other Ambulatory Visit: Payer: Self-pay | Admitting: Family Medicine

## 2018-01-05 ENCOUNTER — Ambulatory Visit: Payer: Medicare Other | Admitting: Cardiovascular Disease

## 2018-01-05 ENCOUNTER — Encounter: Payer: Self-pay | Admitting: Cardiovascular Disease

## 2018-01-05 VITALS — BP 141/68 | Ht 64.0 in | Wt 150.2 lb

## 2018-01-05 DIAGNOSIS — Z953 Presence of xenogenic heart valve: Secondary | ICD-10-CM | POA: Diagnosis not present

## 2018-01-05 DIAGNOSIS — E785 Hyperlipidemia, unspecified: Secondary | ICD-10-CM

## 2018-01-05 DIAGNOSIS — E7800 Pure hypercholesterolemia, unspecified: Secondary | ICD-10-CM

## 2018-01-05 DIAGNOSIS — Z9889 Other specified postprocedural states: Secondary | ICD-10-CM

## 2018-01-05 DIAGNOSIS — I480 Paroxysmal atrial fibrillation: Secondary | ICD-10-CM

## 2018-01-05 DIAGNOSIS — I10 Essential (primary) hypertension: Secondary | ICD-10-CM

## 2018-01-05 DIAGNOSIS — I350 Nonrheumatic aortic (valve) stenosis: Secondary | ICD-10-CM

## 2018-01-05 DIAGNOSIS — R0602 Shortness of breath: Secondary | ICD-10-CM

## 2018-01-05 DIAGNOSIS — Z8679 Personal history of other diseases of the circulatory system: Secondary | ICD-10-CM | POA: Diagnosis not present

## 2018-01-05 MED ORDER — LOSARTAN POTASSIUM 100 MG PO TABS: 100.0000 mg | ORAL_TABLET | Freq: Every day | ORAL | 3 refills | Status: DC

## 2018-01-05 NOTE — Progress Notes (Signed)
Cardiology Office Note   Date:  01/05/2018   ID:  LASHONDA SONNEBORN, DOB [birth year], MRN [id number]  PCP:  [name], MD  Cardiologist:   [name], MD  Cardiothoracic surgeon: Dr. Roxy Manns  Chief Complaint  Patient presents with  . Follow-up    3 months;     History of Present Illness: Catherine [name] is a 83 y.o. 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 Ms. Dettloff was previously a patient of [name]. She underwent mitral commissurotomy in the 1970s. On her echocardiogram 02/2014 she had an ejection fraction of 60-65% with moderate to severe mitral regurgitation. There was also mild aortic regurgitation. Her follow-up echo 02/2015 showed an EF of 60-65% with moderate to severe mitral regurgitation, moderate tricuspid regurgitation and elevated pulmonary artery pressures.  Repeat echo 9/11/7 revealed moderate pulmonary stenosis with moderate to severe mitral regurgitation.  She did not have any pulmonary hypertension and her LV was not dilated.  She was referred for left and right heart catheterization. She was found to have severe mitral stenosis with a 13 mmHg mean gradient, MVA 0.99 cm^2. She also had 50% LCx and 80% RCA lesions.  She had a 29 mm bioprosthetic mitral valve, single vessel CABG and MAZE with Dr. Roxy Manns on 10/14/16.  She was referred for a baseline echo 02/15/17 that revealed LVEF 60-65% with mild aortic stenosis (mean gradient 12 mmHg).  Her bioprosthetic mitral valve was functioning well.    After her surgery Ms. Yontz continued to report fatigue.  It was unclear whether she was in atrial fibrillation or sinus rhythm with a long PR interval.  She was referred to the atrial fibrillation clinic where this remained unclear.  However, it was felt that if she were in atrial fibrillation it would not be possible to  maintain sinus rhythm.  His last appointment she developed lower extremity edema. She stopped amlodipine and this improved. This was switched to irbesartan. She then developed diaphoresis and insomnia that she attributed to this medication.  Ms. Knotek has been doing better since her last appointment.  She continues to exercise three times per week at the [facility].  She has met several very good friends and her mood has been much better.  For the last week she has been struggling with an upper respiratory infection productive of yellow sputum.  She has some shortness of breath but her Symbicort inhaler helps.  She still has experienced several coughing attacks and has difficulty sleeping at night.  She denies fever or chills.  She checks her blood pressure regularly and it has been ranging from the 130s-150s over 60s-80s.  She is frustrated that she cannot seem to lose weight despite exercising regularly.  She does note that she stopped at [employer] for breakfast after each workout.  Lately she has been taking Lasix once daily and has no lower extremity edema or orthopnea.  Since her last appointment Ms. Guizar has been struggling with her anxiety.  She has been working with Dr. Elease Hashimoto to lower her valium use.  She has been taking 7mqhs for many years.  After reducing the dose she has been struggling with panic and feeling "shaky inside."  She gets very short of breath and thought it was her lungs.  She followed up with Dr. WMelvyn Novasand felt better after doing a 6 minute walk.  PFTs  were stable.  She was started on sertraline and valium was temporarily increased.  Since then she has been sleeping and feeling much better.  She denies chest pain and her breathing has been stable.  She has no edema, orthopnea or PND.   Past Medical History:  Diagnosis Date  . Aortic stenosis, mild 07/26/2017  . Arthritis   . Asthma    last attack 02/2015  . Atrial fibrillation, chronic ([place])   . Cellulitis of left lower extremity  08/17/2016   Ulcer associated with severe venous insufficiency  . Chronic anticoagulation   . Chronic diastolic CHF (congestive heart failure) ([place])   . Chronic kidney disease    "RIGHT MANY KIDNEY INFECTIONS AND STONES"  . COPD (chronic obstructive pulmonary disease) ([place])   . Coronary artery disease   . Dizziness   . H/O: rheumatic fever   . Heart murmur   . Hypertension   . PONV (postoperative nausea and vomiting)    ' SOMETIMES', BUT NOT ALWAYS"  . S/P Maze operation for atrial fibrillation 10/14/2016   Complete bilateral atrial lesion set using cryothermy and bipolar radiofrequency ablation - atrial appendage was not treated due to previous surgical procedure (open mitral commissurotomy)  . S/P mitral valve replacement with bioprosthetic valve 10/14/2016   29 mm Medtronic Mosaic porcine bioprosthetic tissue valve  . UTI (urinary tract infection)   . Valvular heart disease    Has mitral stenosis with prior mitral commissurotomy in 1970    Past Surgical History:  Procedure Laterality Date  . ABDOMINAL HYSTERECTOMY  1983   endometriosis  . APPENDECTOMY    . BACK SURGERY     neurosurgery x2  . CARDIAC CATHETERIZATION    . CARDIAC CATHETERIZATION N/A 08/03/2016   Procedure: Right/Left Heart Cath and Coronary Angiography;  Surgeon: Peter M Martinique, MD;  Location: [place] [facility];  Service: Cardiovascular;  Laterality: N/A;  . cataract surg    . CHOLECYSTECTOMY N/A 05/30/2015   Procedure: LAPAROSCOPIC CHOLECYSTECTOMY WITH INTRAOPERATIVE CHOLANGIOGRAM;  Surgeon: Excell Seltzer, MD;  Location: [place];  Service: General;  Laterality: N/A;  . COLONOSCOPY    . CORONARY ARTERY BYPASS GRAFT N/A 10/14/2016   Procedure: CORONARY ARTERY BYPASS GRAFTING (CABG);  Surgeon: Rexene Alberts, MD;  Location: [place];  Service: Open Heart Surgery;  Laterality: N/A;  . EYE SURGERY    . MAZE N/A 10/14/2016   Procedure: MAZE;  Surgeon: Rexene Alberts, MD;  Location: [place];  Service: Open Heart Surgery;   Laterality: N/A;  . MITRAL VALVE REPLACEMENT N/A 10/14/2016   Procedure: REDO MITRAL VALVE REPLACEMENT (MVR);  Surgeon: Rexene Alberts, MD;  Location: [place];  Service: Open Heart Surgery;  Laterality: N/A;  . MITRAL VALVE SURGERY Left 1970   Open mitral commissurotomy via left thoracotomy approach  . TEE WITHOUT CARDIOVERSION N/A 07/05/2016   Procedure: TRANSESOPHAGEAL ECHOCARDIOGRAM (TEE);  Surgeon: [name], MD;  Location: [place];  Service: Cardiovascular;  Laterality: N/A;  . TEE WITHOUT CARDIOVERSION N/A 10/14/2016   Procedure: TRANSESOPHAGEAL ECHOCARDIOGRAM (TEE);  Surgeon: Rexene Alberts, MD;  Location: [place];  Service: Open Heart Surgery;  Laterality: N/A;    Current Outpatient Medications  Medication Sig Dispense Refill  . acetaminophen (TYLENOL) 500 MG tablet Take 500 mg by mouth every 6 (six) hours as needed for mild pain.    Marland Kitchen albuterol (PROVENTIL HFA;VENTOLIN HFA) 108 (90 Base) MCG/ACT inhaler Inhale 2 puffs into the lungs every 6 (six) hours as needed for wheezing or  shortness of breath. 1 Inhaler 0  . aspirin EC 81 MG EC tablet Take 1 tablet (81 mg total) by mouth daily.    . diazepam (VALIUM) 5 MG tablet Take 1 tablet (5 mg total) by mouth at bedtime. 30 tablet 5  . furosemide (LASIX) 40 MG tablet Take 40 mg by mouth daily. Patient takes 1 tablet twice daily if needed for swelling    . losartan (COZAAR) 50 MG tablet Take 1 tablet (50 mg total) by mouth daily. 90 tablet 3  . OVER THE COUNTER MEDICATION Apply 1 application topically at bedtime as needed (Leg Pain). Magni-Life topical cream for leg pain.    . potassium chloride (K-DUR,KLOR-CON) 10 MEQ tablet Take 1 tablet (10 mEq total) by mouth 2 (two) times daily. Take an extra 2 tablets on days you take extra Furosemide 120 tablet 11  . sertraline (ZOLOFT) 50 MG tablet Take 1 tablet (50 mg total) by mouth daily. 30 tablet 3  . SYMBICORT 80-4.5 MCG/ACT inhaler 2 PUFFS 2 TIMES A DAY 10.2 g 0  .  triamterene-hydrochlorothiazide (MAXZIDE-25) 37.5-25 MG tablet Take 1 tablet by mouth daily. 90 tablet 3  . warfarin (COUMADIN) 2 MG tablet TAKE 1 TABLET ONCE DAILY AT 6PM, OR AS DIRECTED BY COUMADIN CLINIC 90 tablet 0   No current facility-administered medications for this visit.     Allergies:   Aldactone [spironolactone]; Amoxicillin; Diltiazem; Flagyl [metronidazole hcl]; Flovent [fluticasone propionate]; Gabapentin; Lyrica [pregabalin]; Quinidine; Simvastatin; Tramadol; Verapamil; Amlodipine; Ace inhibitors; Benazepril hcl; Ciprocin-fluocin-procin [fluocinolone acetonide]; Ciprofloxacin; Codeine; and Nitrofurantoin monohyd macro    Social History:  The patient  reports that she quit smoking about 42 years ago. Her smoking use included cigarettes. She has a 15.00 pack-year smoking history. She has never used smokeless tobacco. She reports that she does not drink alcohol or use drugs.   Family History:  The patient's family history includes Leukemia in her father.    ROS:  Please see the history of present illness.   Otherwise, review of systems are positive for chest wall soreness.   All other systems are reviewed and negative.    PHYSICAL EXAM: VS:  BP (!) 141/68   Ht 5' 4"  (1.626 m)   Wt 150 lb 3.2 oz (68.1 kg)   BMI 25.78 kg/m  , BMI Body mass index is 25.78 kg/m. GENERAL:  Well appearing HEENT: Pupils equal round and reactive, fundi not visualized, oral mucosa unremarkable NECK:  No jugular venous distention, waveform within normal limits, carotid upstroke brisk and symmetric, no bruits, no thyromegaly LYMPHATICS:  No cervical adenopathy LUNGS:  Clear to auscultation bilaterally HEART: Irregularly irregular.  PMI not displaced or sustained,S1 and S2 within normal limits, no S3, no S4, no clicks, no rubs, II/VI systolic murmur at the LUSB with radiation into the carotids ABD:  Flat, positive bowel sounds normal in frequency in pitch, no bruits, no rebound, no guarding, no midline  pulsatile mass, no hepatomegaly, no splenomegaly EXT:  2 plus pulses throughout, no edema, no cyanosis no clubbing SKIN:  No rashes no nodules NEURO:  Cranial nerves II through XII grossly intact, motor grossly intact throughout PSYCH:  Cognitively intact, oriented to person place and time   EKG:  EKG is ordered today. The ekg ordered 05/21/16 demonstrates atrial fibrillateion.  Prior septal infarct.  Rate 60 bpm.  Unchanged from 10/22/15. 10/27/16: Sinus rhythm.  Rate 60 bpm.  First degree heart block. 02/01/17: Sinus bradycardia with sinus arrhythmia and first degree block.   03/08/17:  Sinus bradycardia with sinus arrhythmia.   First degree heart block.  Junctional escape.  QTc 497 ms.   04/26/17: Atrial fibrillation.  Rate 69 bpm. 01/05/18: Accelerated junctional rhythm.  Rate 68 bpm.  Incomplete RBBB.   Echo 02/15/17: Study Conclusions  - Left ventricle: The cavity size was normal. There was mild   concentric hypertrophy. Systolic function was normal. The   estimated ejection fraction was in the range of 60% to 65%. Wall   motion was normal; there were no regional wall motion   abnormalities. - Aortic valve: There was very mild stenosis. There was no   regurgitation. Peak velocity (S): 232 cm/s. Mean gradient (S): 12   mm Hg. - Mitral valve: A 96m Medtronic Mosaic bioprosthesis was present   and functioning properly. There was trivial regurgitation. - Left atrium: The atrium was severely dilated. - Right ventricle: The cavity size was mildly dilated. Wall   thickness was normal. Systolic function was normal. - Tricuspid valve: There was moderate regurgitation. - Pulmonary arteries: Systolic pressure was mildly increased. PA   peak pressure: 41 mm Hg (S).  Echo 06/07/16:  Study Conclusions  - Left ventricle: The cavity size was normal. Wall thickness was   normal. Systolic function was vigorous. The estimated ejection   fraction was in the range of 65% to 70%. - Mitral valve: MV  is thickened. with some restricted motion Peak   and mean gradients through the valve are 22 and 8 mm Hg   respectively MVA by Pt1/2 is 1.9 cm2. Calcified annulus. Mildly   thickened leaflets . There was moderate to severe regurgitation. - Left atrium: The atrium was massively dilated. - Right atrium: The atrium was mildly dilated.  LHC/RHC 08/03/16: The left ventricular systolic function is normal.  LV end diastolic pressure is normal.  The left ventricular ejection fraction is 55-65% by visual estimate.  There is no aortic valve stenosis.  There is severe mitral valve stenosis.  Prox Cx to Mid Cx lesion, 50 %stenosed.  Mid RCA-2 lesion, 80 %stenosed.  Mid RCA-1 lesion, 80 %stenosed.  Hemodynamic findings consistent with mild pulmonary hypertension.  LV end diastolic pressure is normal.   1. Coronary artery disease   - 50% mid LCx   - 80% sequential lesions in the mid RCA 2. Normal LV function 3. Severe mitral stenosis. MV gradient of 13 mm Hg. MVA 0.99 cm squared with index 0.57. 4. Moderate to severe mitral insufficiency 5. Mild pulmonary HTN. 6. Normal LV EDP 7. No significant AV gradient 8. Occluded right brachial artery.  Carotid Dopplers 10/12/16: 1-39% bilateral ICA stenosis  7 Day Event Monitor 06/28/17:  Quality: Fair.  Baseline artifact. Predominant rhythm: Sinus rhythm with PACs and sinus arrhythmia.    Cannot  rule out episodes of atrial fibrillation.  Recent Labs: 02/01/2017: TSH 4.09 12/13/2017: BUN 12; Creatinine, Ser 0.85; Hemoglobin 14.4; Platelets 270.0; Potassium 3.5; Sodium 141    Lipid Panel    Component Value Date/Time   CHOL 158 12/20/2016 1112   TRIG 184.0 (H) 12/20/2016 1112   HDL 60.00 12/20/2016 1112   CHOLHDL 3 12/20/2016 1112   VLDL 36.8 12/20/2016 1112   LDLCALC 61 12/20/2016 1112   LDLDIRECT 134.7 08/28/2014 1009      Wt Readings from Last 3 Encounters:  01/05/18 150 lb 3.2 oz (68.1 kg)  12/23/17 149 lb 1.6 oz (67.6 kg)    12/20/17 149 lb 6.4 oz (67.8 kg)      ASSESSMENT AND PLAN:  #  Rheumatic mitral valve disease: # s/p bioprosthetic mitral valve: Ms. Josephs is doing well.  She has no evidence of heart failure on exam.  Her volume status is well-controlled.  Continue furosemide.  She needs antibiotics prior to dental procedures.  # Mild aortic stenosis: Mean gradient 12 mmHg 01/2017.  Repeat echo at follow up.  # Persistent atrial fibrillation:  # Bradycardia: # Palpitations: Rate well-controlled.  She is in junctional rhythm today.  Continue warfarin.   # Hypertension:  Blood pressure is above goal here and at home.  Increase losartan to 170m daily. Continue HCTZ-triamterene.  # Nonobstructive coronary disease: # Hyperlipidemia: Has not tolerated statins.  Start Zetia 116mqhs and repeat lipids in 3 months.    Current medicines are reviewed at length with the patient today.  The patient does not have concerns regarding medicines.  The following changes have been made:  none  Labs/ tests ordered today include:   No orders of the defined types were placed in this encounter.   Disposition:   FU with Emersyn Wyss C. RaOval LinseyMD, FAKawe[facility] Hospitaln 3 months.     Signed, Almin Livingstone C. RaOval LinseyMD, FAColmery-O'Neil Va Medical Center4/07/2018 11:13 AM    [place]

## 2018-01-05 NOTE — Patient Instructions (Signed)
Medication Instructions:  Increase Losartan 100 mg once a day  Labwork: Your physician recommends that you return for lab work in: BMP in 1 week with PCP Today: Lipids, CMP    Follow-Up: Your physician recommends that you schedule a follow-up appointment in: 3 month with Dr. Oval Linsey   Any Other Special Instructions Will Be Listed Below (If Applicable).     If you need a refill on your cardiac medications before your next appointment, please call your pharmacy.

## 2018-01-06 LAB — COMPREHENSIVE METABOLIC PANEL
A/G RATIO: 2.1 (ref 1.2–2.2)
ALT: 24 IU/L (ref 0–32)
AST: 26 IU/L (ref 0–40)
Albumin: 4.6 g/dL (ref 3.5–4.7)
Alkaline Phosphatase: 85 IU/L (ref 39–117)
BUN/Creatinine Ratio: 13 (ref 12–28)
BUN: 11 mg/dL (ref 8–27)
Bilirubin Total: 0.7 mg/dL (ref 0.0–1.2)
CALCIUM: 9.3 mg/dL (ref 8.7–10.3)
CO2: 28 mmol/L (ref 20–29)
Chloride: 102 mmol/L (ref 96–106)
Creatinine, Ser: 0.87 mg/dL (ref 0.57–1.00)
GFR calc Af Amer: 73 mL/min/{1.73_m2} (ref >59)
GFR, EST NON AFRICAN AMERICAN: 63 mL/min/{1.73_m2} (ref >59)
Globulin, Total: 2.2 g/dL (ref 1.5–4.5)
Glucose: 89 mg/dL (ref 65–99)
POTASSIUM: 3.6 mmol/L (ref 3.5–5.2)
Sodium: 145 mmol/L — ABNORMAL HIGH (ref 134–144)
Total Protein: 6.8 g/dL (ref 6.0–8.5)

## 2018-01-06 LAB — LIPID PANEL
CHOL/HDL RATIO: 4.3 ratio (ref 0.0–4.4)
Cholesterol, Total: 252 mg/dL — ABNORMAL HIGH (ref 100–199)
HDL: 58 mg/dL (ref >39)
LDL CALC: 162 mg/dL — AB (ref 0–99)
Triglycerides: 162 mg/dL — ABNORMAL HIGH (ref 0–149)
VLDL Cholesterol Cal: 32 mg/dL (ref 5–40)

## 2018-01-12 ENCOUNTER — Encounter: Payer: Self-pay | Admitting: Cardiovascular Disease

## 2018-01-18 ENCOUNTER — Encounter: Payer: Self-pay | Admitting: Family Medicine

## 2018-01-18 ENCOUNTER — Ambulatory Visit (INDEPENDENT_AMBULATORY_CARE_PROVIDER_SITE_OTHER): Payer: Medicare Other | Admitting: Family Medicine

## 2018-01-18 VITALS — BP 118/66 | HR 55 | Temp 98.9°F | Ht 64.0 in | Wt 150.4 lb

## 2018-01-18 DIAGNOSIS — I10 Essential (primary) hypertension: Secondary | ICD-10-CM

## 2018-01-18 DIAGNOSIS — F5104 Psychophysiologic insomnia: Secondary | ICD-10-CM

## 2018-01-18 DIAGNOSIS — F419 Anxiety disorder, unspecified: Secondary | ICD-10-CM

## 2018-01-18 DIAGNOSIS — E785 Hyperlipidemia, unspecified: Secondary | ICD-10-CM | POA: Diagnosis not present

## 2018-01-18 NOTE — Progress Notes (Signed)
Subjective:     Patient ID: Catherine [name], female   DOB: 1935/09/01, 83 y.o.   MRN: [id number]  HPI Patient here to follow-up regarding anxiety issues. Refer to previous note. We started back diazepam 5 mg daily at bedtime which she had taken for many years. We've been trying to get her off long acting benzos to risk of falls. She did not tolerate tapering very well.  We also started sertraline 50 mg one half tablet daily. She states she had increased sedation with starting this and she decided to keep dose at 25 mg. She feels her anxiety symptoms are improved overall-since starting Sertraline.  Recently seen by cardiology. Blood pressure been elevated both at home and in their office and they increased her losartan to 100 mg. Blood pressures been stable since then. They requested follow-up basic metabolic panel today.  She was also started back recently on Zetia 10 mg daily for hyperlipidemia. Previous intolerance with statins.  Past Medical History:  Diagnosis Date  . Aortic stenosis, mild 07/26/2017  . Arthritis   . Asthma    last attack 02/2015  . Atrial fibrillation, chronic ([place])   . Cellulitis of left lower extremity 08/17/2016   Ulcer associated with severe venous insufficiency  . Chronic anticoagulation   . Chronic diastolic CHF (congestive heart failure) ([place])   . Chronic kidney disease    "RIGHT MANY KIDNEY INFECTIONS AND STONES"  . COPD (chronic obstructive pulmonary disease) ([place])   . Coronary artery disease   . Dizziness   . H/O: rheumatic fever   . Heart murmur   . Hypertension   . PONV (postoperative nausea and vomiting)    ' SOMETIMES', BUT NOT ALWAYS"  . S/P Maze operation for atrial fibrillation 10/14/2016   Complete bilateral atrial lesion set using cryothermy and bipolar radiofrequency ablation - atrial appendage was not treated due to previous surgical procedure (open mitral commissurotomy)  . S/P mitral valve replacement with bioprosthetic valve 10/14/2016   29  mm Medtronic Mosaic porcine bioprosthetic tissue valve  . UTI (urinary tract infection)   . Valvular heart disease    Has mitral stenosis with prior mitral commissurotomy in 1970   Past Surgical History:  Procedure Laterality Date  . ABDOMINAL HYSTERECTOMY  1983   endometriosis  . APPENDECTOMY    . BACK SURGERY     neurosurgery x2  . CARDIAC CATHETERIZATION    . CARDIAC CATHETERIZATION N/A 08/03/2016   Procedure: Right/Left Heart Cath and Coronary Angiography;  Surgeon: Peter M Martinique, MD;  Location: [place] [facility];  Service: Cardiovascular;  Laterality: N/A;  . cataract surg    . CHOLECYSTECTOMY N/A 05/30/2015   Procedure: LAPAROSCOPIC CHOLECYSTECTOMY WITH INTRAOPERATIVE CHOLANGIOGRAM;  Surgeon: Excell Seltzer, MD;  Location: [place];  Service: General;  Laterality: N/A;  . COLONOSCOPY    . CORONARY ARTERY BYPASS GRAFT N/A 10/14/2016   Procedure: CORONARY ARTERY BYPASS GRAFTING (CABG);  Surgeon: Rexene Alberts, MD;  Location: [place];  Service: Open Heart Surgery;  Laterality: N/A;  . EYE SURGERY    . MAZE N/A 10/14/2016   Procedure: MAZE;  Surgeon: Rexene Alberts, MD;  Location: [place];  Service: Open Heart Surgery;  Laterality: N/A;  . MITRAL VALVE REPLACEMENT N/A 10/14/2016   Procedure: REDO MITRAL VALVE REPLACEMENT (MVR);  Surgeon: Rexene Alberts, MD;  Location: [place];  Service: Open Heart Surgery;  Laterality: N/A;  . MITRAL VALVE SURGERY Left 1970   Open mitral commissurotomy via left thoracotomy approach  .  TEE WITHOUT CARDIOVERSION N/A 07/05/2016   Procedure: TRANSESOPHAGEAL ECHOCARDIOGRAM (TEE);  Surgeon: [name], MD;  Location: [place];  Service: Cardiovascular;  Laterality: N/A;  . TEE WITHOUT CARDIOVERSION N/A 10/14/2016   Procedure: TRANSESOPHAGEAL ECHOCARDIOGRAM (TEE);  Surgeon: Rexene Alberts, MD;  Location: [place];  Service: Open Heart Surgery;  Laterality: N/A;    reports that she quit smoking about 42 years ago. Her smoking use included cigarettes. She  has a 15.00 pack-year smoking history. She has never used smokeless tobacco. She reports that she does not drink alcohol or use drugs. family history includes Leukemia in her father. Allergies  Allergen Reactions  . Aldactone [Spironolactone] Other (See Comments)    dyspnea  . Amoxicillin Palpitations    Tachycardia Has patient had a PCN reaction causing immediate rash, facial/tongue/throat swelling, SOB or lightheadedness with hypotension: no Has patient had a PCN reaction causing severe rash involving mucus membranes or skin necrosis: {no Has patient had a PCN reaction that required hospitalization {no Has patient had a PCN reaction occurring within the last 10 years: {yes If all of the above answers are "NO", then may proceed with Cephalosporin use.  . Diltiazem Other (See Comments)    Causing headaches   . Flagyl [Metronidazole Hcl] Other (See Comments)    Causing headaches    . Flovent [Fluticasone Propionate] Other (See Comments)    Leg cramps  . Gabapentin Swelling  . Lyrica [Pregabalin] Swelling  . Quinidine Diarrhea and Other (See Comments)    Fever diarrhea  . Simvastatin Other (See Comments)    Leg pain, myalgia  . Tramadol Nausea Only  . Verapamil Other (See Comments)    myalgias  . Amlodipine     Low extremity edema  . Ace Inhibitors Other (See Comments)    unknown  . Benazepril Hcl Cough  . Ciprocin-Fluocin-Procin [Fluocinolone Acetonide] Other (See Comments)    unknown  . Ciprofloxacin Diarrhea  . Codeine Nausea Only  . Nitrofurantoin Monohyd Macro Nausea Only     Review of Systems  Constitutional: Negative for fatigue.  Eyes: Negative for visual disturbance.  Respiratory: Negative for cough, chest tightness, shortness of breath and wheezing.   Cardiovascular: Negative for chest pain, palpitations and leg swelling.  Neurological: Negative for dizziness, seizures, syncope, weakness, light-headedness and headaches.  Psychiatric/Behavioral: The patient is  nervous/anxious.        Objective:   Physical Exam  Constitutional: She is oriented to person, place, and time. She appears well-developed and well-nourished.  Cardiovascular: Normal rate and regular rhythm.  Pulmonary/Chest: Effort normal and breath sounds normal. No respiratory distress. She has no wheezes. She has no rales.  Musculoskeletal: She exhibits no edema.  Neurological: She is alert and oriented to person, place, and time.  Psychiatric: She has a normal mood and affect. Her behavior is normal.       Assessment:     #1 chronic anxiety. Possible history of panic disorder. Improved on sertraline. She is reluctant to increase to 50 mg and will keep dose currently 25 mg daily  #2 hypertension improved- with increase in Losartan  #3 dyslipidemia.    Plan:     -recheck basic metabolic panel -continue Sertraline 25 mg qhs.  Would consider repeat trial at 50 mg if her anxiety symptoms increase in future.  [name] MD [facility] at [facility]

## 2018-01-18 NOTE — Patient Instructions (Signed)
Keep the Sertraline at 25 mg once daily  If anxiety increases further down the road we could increase to 50 mg .

## 2018-01-19 LAB — BASIC METABOLIC PANEL
BUN: 12 mg/dL (ref 6–23)
CHLORIDE: 101 meq/L (ref 96–112)
CO2: 31 meq/L (ref 19–32)
Calcium: 9.5 mg/dL (ref 8.4–10.5)
Creatinine, Ser: 0.95 mg/dL (ref 0.40–1.20)
GFR: 60.08 mL/min (ref >60.00)
GLUCOSE: 94 mg/dL (ref 70–99)
POTASSIUM: 3.3 meq/L — AB (ref 3.5–5.1)
SODIUM: 142 meq/L (ref 135–145)

## 2018-01-30 ENCOUNTER — Other Ambulatory Visit: Payer: Self-pay | Admitting: General Practice

## 2018-01-30 ENCOUNTER — Other Ambulatory Visit: Payer: Self-pay | Admitting: Family Medicine

## 2018-01-30 ENCOUNTER — Ambulatory Visit (INDEPENDENT_AMBULATORY_CARE_PROVIDER_SITE_OTHER): Payer: Medicare Other | Admitting: General Practice

## 2018-01-30 DIAGNOSIS — I4891 Unspecified atrial fibrillation: Secondary | ICD-10-CM

## 2018-01-30 LAB — POCT INR: INR: 1.7

## 2018-01-30 MED ORDER — WARFARIN SODIUM 2 MG PO TABS: ORAL_TABLET | ORAL | 1 refills | Status: DC

## 2018-01-30 NOTE — Patient Instructions (Addendum)
Pre visit review using our clinic review tool, if applicable. No additional management support is needed unless otherwise documented below in the visit note.  Take 2 tablets today and then start taking 1 tablet every day except 2 tablets on Mondays.  Re-check in 4 weeks.

## 2018-02-03 ENCOUNTER — Other Ambulatory Visit: Payer: Self-pay | Admitting: Family Medicine

## 2018-02-22 ENCOUNTER — Ambulatory Visit (INDEPENDENT_AMBULATORY_CARE_PROVIDER_SITE_OTHER): Payer: Medicare Other | Admitting: General Practice

## 2018-02-22 DIAGNOSIS — Z7901 Long term (current) use of anticoagulants: Secondary | ICD-10-CM | POA: Diagnosis not present

## 2018-02-22 DIAGNOSIS — I4891 Unspecified atrial fibrillation: Secondary | ICD-10-CM

## 2018-02-22 LAB — POCT INR: INR: 2.5 (ref 2.0–3.0)

## 2018-02-22 NOTE — Patient Instructions (Addendum)
Pre visit review using our clinic review tool, if applicable. No additional management support is needed unless otherwise documented below in the visit note.  Continue to take 1 tablet every day except 2 tablets on Mondays.  Re-check in 4 weeks.  

## 2018-02-27 ENCOUNTER — Ambulatory Visit: Payer: Medicare Other

## 2018-02-28 ENCOUNTER — Other Ambulatory Visit: Payer: Self-pay | Admitting: Family Medicine

## 2018-03-27 ENCOUNTER — Ambulatory Visit: Payer: Medicare Other

## 2018-03-28 ENCOUNTER — Other Ambulatory Visit: Payer: Self-pay | Admitting: Cardiovascular Disease

## 2018-04-03 ENCOUNTER — Ambulatory Visit (INDEPENDENT_AMBULATORY_CARE_PROVIDER_SITE_OTHER): Payer: Medicare Other | Admitting: General Practice

## 2018-04-03 ENCOUNTER — Other Ambulatory Visit: Payer: Self-pay | Admitting: Cardiovascular Disease

## 2018-04-03 ENCOUNTER — Other Ambulatory Visit: Payer: Self-pay | Admitting: Family Medicine

## 2018-04-03 DIAGNOSIS — I4891 Unspecified atrial fibrillation: Secondary | ICD-10-CM

## 2018-04-03 LAB — POCT INR: INR: 2.2 (ref 2.0–3.0)

## 2018-04-03 NOTE — Patient Instructions (Addendum)
Pre visit review using our clinic review tool, if applicable. No additional management support is needed unless otherwise documented below in the visit note.  Continue to take 1 tablet every day except 2 tablets on Mondays.  Re-check in 6 weeks.

## 2018-04-13 ENCOUNTER — Ambulatory Visit: Payer: Medicare Other | Admitting: Cardiovascular Disease

## 2018-05-01 DIAGNOSIS — Z08 Encounter for follow-up examination after completed treatment for malignant neoplasm: Secondary | ICD-10-CM | POA: Diagnosis not present

## 2018-05-01 DIAGNOSIS — Z85828 Personal history of other malignant neoplasm of skin: Secondary | ICD-10-CM | POA: Diagnosis not present

## 2018-05-01 DIAGNOSIS — C44729 Squamous cell carcinoma of skin of left lower limb, including hip: Secondary | ICD-10-CM | POA: Diagnosis not present

## 2018-05-15 ENCOUNTER — Ambulatory Visit (INDEPENDENT_AMBULATORY_CARE_PROVIDER_SITE_OTHER): Payer: Medicare Other | Admitting: General Practice

## 2018-05-15 DIAGNOSIS — I4891 Unspecified atrial fibrillation: Secondary | ICD-10-CM

## 2018-05-15 LAB — POCT INR: INR: 2.6 (ref 2.0–3.0)

## 2018-05-15 NOTE — Patient Instructions (Addendum)
Pre visit review using our clinic review tool, if applicable. No additional management support is needed unless otherwise documented below in the visit note.  Continue to take 1 tablet every day except 2 tablets on Mondays.  Re-check in 6 weeks.

## 2018-05-16 ENCOUNTER — Other Ambulatory Visit: Payer: Self-pay | Admitting: Cardiovascular Disease

## 2018-05-19 ENCOUNTER — Telehealth: Payer: Self-pay | Admitting: Family Medicine

## 2018-05-19 MED ORDER — FLUCONAZOLE 150 MG PO TABS: 150.0000 mg | ORAL_TABLET | Freq: Once | ORAL | 0 refills | Status: AC

## 2018-05-19 NOTE — Telephone Encounter (Signed)
Rx sent and patient is aware. 

## 2018-05-19 NOTE — Telephone Encounter (Signed)
Fluconazole 150 mg times one dose.

## 2018-05-19 NOTE — Telephone Encounter (Signed)
Copied from [place]. Topic: Quick Communication - Rx Refill/Question >> May 19, 2018  1:03 PM Oliver Pila B wrote: Pt called to have pcp to give her a pill that she takes after she takes an abx; contact pt to advise

## 2018-05-23 ENCOUNTER — Ambulatory Visit: Payer: Medicare Other | Admitting: Cardiovascular Disease

## 2018-05-23 ENCOUNTER — Encounter: Payer: Self-pay | Admitting: Cardiovascular Disease

## 2018-05-23 VITALS — BP 122/70 | HR 64 | Ht 64.0 in | Wt 150.6 lb

## 2018-05-23 DIAGNOSIS — L659 Nonscarring hair loss, unspecified: Secondary | ICD-10-CM | POA: Diagnosis not present

## 2018-05-23 DIAGNOSIS — Z5181 Encounter for therapeutic drug level monitoring: Secondary | ICD-10-CM | POA: Diagnosis not present

## 2018-05-23 DIAGNOSIS — I350 Nonrheumatic aortic (valve) stenosis: Secondary | ICD-10-CM | POA: Diagnosis not present

## 2018-05-23 DIAGNOSIS — E785 Hyperlipidemia, unspecified: Secondary | ICD-10-CM

## 2018-05-23 DIAGNOSIS — R5383 Other fatigue: Secondary | ICD-10-CM

## 2018-05-23 DIAGNOSIS — I10 Essential (primary) hypertension: Secondary | ICD-10-CM

## 2018-05-23 DIAGNOSIS — R001 Bradycardia, unspecified: Secondary | ICD-10-CM

## 2018-05-23 LAB — COMPREHENSIVE METABOLIC PANEL
ALBUMIN: 4.7 g/dL (ref 3.5–4.7)
ALK PHOS: 90 IU/L (ref 39–117)
ALT: 29 IU/L (ref 0–32)
AST: 33 IU/L (ref 0–40)
Albumin/Globulin Ratio: 2.2 (ref 1.2–2.2)
BILIRUBIN TOTAL: 0.5 mg/dL (ref 0.0–1.2)
BUN/Creatinine Ratio: 13 (ref 12–28)
BUN: 12 mg/dL (ref 8–27)
CO2: 29 mmol/L (ref 20–29)
CREATININE: 0.94 mg/dL (ref 0.57–1.00)
Calcium: 9.7 mg/dL (ref 8.7–10.3)
Chloride: 97 mmol/L (ref 96–106)
GFR calc non Af Amer: 57 mL/min/{1.73_m2} — ABNORMAL LOW (ref >59)
GFR, EST AFRICAN AMERICAN: 66 mL/min/{1.73_m2} (ref >59)
Globulin, Total: 2.1 g/dL (ref 1.5–4.5)
Glucose: 103 mg/dL — ABNORMAL HIGH (ref 65–99)
Potassium: 3.5 mmol/L (ref 3.5–5.2)
SODIUM: 145 mmol/L — AB (ref 134–144)
Total Protein: 6.8 g/dL (ref 6.0–8.5)

## 2018-05-23 LAB — LIPID PANEL
Chol/HDL Ratio: 3.7 ratio (ref 0.0–4.4)
Cholesterol, Total: 224 mg/dL — ABNORMAL HIGH (ref 100–199)
HDL: 61 mg/dL (ref >39)
LDL Calculated: 136 mg/dL — ABNORMAL HIGH (ref 0–99)
Triglycerides: 134 mg/dL (ref 0–149)
VLDL CHOLESTEROL CAL: 27 mg/dL (ref 5–40)

## 2018-05-23 LAB — TSH: TSH: 3.67 u[IU]/mL (ref 0.450–4.500)

## 2018-05-23 MED ORDER — DOXAZOSIN MESYLATE 4 MG PO TABS: 4.0000 mg | ORAL_TABLET | Freq: Every day | ORAL | 1 refills | Status: DC

## 2018-05-23 NOTE — Addendum Note (Signed)
Addended by: Alvina Filbert B on: 05/23/2018 01:15 PM   Modules accepted: Orders

## 2018-05-23 NOTE — Progress Notes (Signed)
Cardiology Office Note   Date:  05/23/2018   ID:  [name], DOB 12/14/1934, MRN [id number]  PCP:  [name], MD  Cardiologist:   [name], MD  Cardiothoracic surgeon: Dr. Roxy Manns  No chief complaint on file.    History of Present Illness: Catherine [name] is a 83 y.o. 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 Catherine [name] was previously a patient of [name]. She underwent mitral commissurotomy in the 1970s. On her echocardiogram 02/2014 she had an ejection fraction of 60-65% with moderate to severe mitral regurgitation. There was also mild aortic regurgitation. Her follow-up echo 02/2015 showed an EF of 60-65% with moderate to severe mitral regurgitation, moderate tricuspid regurgitation and elevated pulmonary artery pressures.  Repeat echo 9/11/7 revealed moderate pulmonary stenosis with moderate to severe mitral regurgitation.  She did not have any pulmonary hypertension and her LV was not dilated.  She was referred for left and right heart catheterization. She was found to have severe mitral stenosis with a 13 mmHg mean gradient, MVA 0.99 cm^2. She also had 50% LCx and 80% RCA lesions.  She had a 29 mm bioprosthetic mitral valve, single vessel CABG and MAZE with Dr. Roxy Manns on 10/14/16.  She was referred for a baseline echo 02/15/17 that revealed LVEF 60-65% with mild aortic stenosis (mean gradient 12 mmHg).  Her bioprosthetic mitral valve was functioning well.    After her surgery [name] continued to report fatigue.  It was unclear whether she was in atrial fibrillation or sinus rhythm with a long PR interval.  She was referred to the atrial fibrillation clinic where this remained unclear.  However, it was felt that if she were in atrial fibrillation it would not be possible to maintain sinus rhythm.  His last  appointment she developed lower extremity edema. She stopped amlodipine and this improved. This was switched to irbesartan. She then developed diaphoresis and insomnia that she attributed to this medication.  Catherine [name] has been doing better since her last appointment.  She continues to exercise three times per week at the [facility].  She has met several very good friends and her mood has been much better.  For the last week she has been struggling with an upper respiratory infection productive of yellow sputum.  She has some shortness of breath but her Symbicort inhaler helps.  She still has experienced several coughing attacks and has difficulty sleeping at night.  She denies fever or chills.  She checks her blood pressure regularly and it has been ranging from the 130s-150s over 60s-80s.  She is frustrated that she cannot seem to lose weight despite exercising regularly.  She does note that she stopped at [employer] for breakfast after each workout.  Lately she has been taking Lasix once daily and has no lower extremity edema or orthopnea.  Since her last appointment Catherine [name] has been struggling with her anxiety.  She has been working with Dr. Elease Hashimoto to lower her valium use.  She has been taking 13mqhs for many years.  After reducing the dose she has been struggling with panic and feeling "shaky inside."  She gets very short of breath and thought it was her lungs.  She followed up with Dr. WMelvyn Novasand felt better after doing a 6 minute walk.  PFTs were stable.  She was started on sertraline and valium  was temporarily increased.  Since then she has been sleeping and feeling much better.  She denies chest pain and her breathing has been stable.  She has no edema, orthopnea or PND.   At her last appointment Catherine [name]'s BP was elevated so losartan was increased to 170m. She was also started on Zetia due to statin intolerance.  Since then she has noted an increase in her cough.  She had similar symptoms in the past  with benazepril.  Her blood pressures been mostly in the 120s to 140s over 50s to 60s.  She also complains of cramps in her lower legs and ankles that are worse at night.  [facility]  She has no exertional symptoms.  She has not noted any lower extremity edema, orthopnea, or PND.  Her anxiety has been much better controlled.  She did have 2 panic attacks but was able to redirect her thoughts easily.   Past Medical History:  Diagnosis Date  . Aortic stenosis, mild 07/26/2017  . Arthritis   . Asthma    last attack 02/2015  . Atrial fibrillation[place] chronic [place]   . Cellulitis of left lower extremity 08/17/2016   Ulcer associated with severe venous insufficiency  . Chronic anticoagulation   . Chronic diastolic CHF (congestive heart failure) ([place]   . Chronic kidney disease    "RIGHT MANY KIDNEY INFECTIONS AND STONES"  . COPD (chronic obstructive pulmonary disease) ([place]   . Coronary artery disease   . Dizziness   . H/O: rheumatic fever   . Heart murmur   . Hypertension   . PONV (postoperative nausea and vomiting)    ' SOMETIMES', BUT NOT ALWAYS"  . S/P Maze operation for atrial fibrillation 10/14/2016   Complete bilateral atrial lesion set using cryothermy and bipolar radiofrequency ablation - atrial appendage was not treated due to previous surgical procedure (open mitral commissurotomy)  . S/P mitral valve replacement with bioprosthetic valve 10/14/2016   29 mm Medtronic Mosaic porcine bioprosthetic tissue valve  . UTI (urinary tract infection)   . Valvular heart disease    Has mitral stenosis with prior mitral commissurotomy in 1970    Past Surgical History:  Procedure Laterality Date  . ABDOMINAL HYSTERECTOMY  1983   endometriosis  . APPENDECTOMY    . BACK SURGERY     neurosurgery x2  . CARDIAC CATHETERIZATION    . CARDIAC CATHETERIZATION N/A 08/03/2016   Procedure: Right/Left Heart Cath and Coronary Angiography;  Surgeon: Peter M  JMartinique MD;  [facility];  Service: Cardiovascular;  Laterality: N/A;  . cataract surg    . CHOLECYSTECTOMY N/A 05/30/2015   Procedure: LAPAROSCOPIC CHOLECYSTECTOMY WITH INTRAOPERATIVE CHOLANGIOGRAM;  Surgeon: BExcell Seltzer MD;  [place] [facility]  Service: General;  Laterality: N/A;  . COLONOSCOPY    . CORONARY ARTERY BYPASS GRAFT N/A 10/14/2016   Procedure: CORONARY ARTERY BYPASS GRAFTING (CABG);  Surgeon: CRexene Alberts MD;  [facility]  Service: Open Heart Surgery;  Laterality: N/A;  . EYE SURGERY    . MAZE N/A 10/14/2016   Procedure: MAZE;  Surgeon: CRexene Alberts MD;  [place] [facility]  Service: Open Heart Surgery;  Laterality: N/A;  . MITRAL VALVE REPLACEMENT N/A 10/14/2016   Procedure: REDO MITRAL VALVE REPLACEMENT (MVR);  Surgeon: CRexene Alberts MD;  [place] [facility]  Service: Open Heart Surgery;  Laterality: N/A;  . MITRAL VALVE SURGERY Left 1970   Open mitral commissurotomy via  left thoracotomy approach  . TEE WITHOUT CARDIOVERSION N/A 07/05/2016   Procedure: TRANSESOPHAGEAL ECHOCARDIOGRAM (TEE);  Surgeon: [name], MD;  Location: [facility];  Service: Cardiovascular;  Laterality: N/A;  . TEE WITHOUT CARDIOVERSION N/A 10/14/2016   Procedure: TRANSESOPHAGEAL ECHOCARDIOGRAM (TEE);  Surgeon: Rexene Alberts, MD;  Location: [place];  Service: Open Heart Surgery;  Laterality: N/A;    Current Outpatient Medications  Medication Sig Dispense Refill  . acetaminophen (TYLENOL) 500 MG tablet Take 500 mg by mouth every 6 (six) hours as needed for mild pain.    Marland Kitchen albuterol (PROVENTIL HFA;VENTOLIN HFA) 108 (90 Base) MCG/ACT inhaler Inhale 2 puffs into the lungs every 6 (six) hours as needed for wheezing or shortness of breath. 1 Inhaler 0  . aspirin EC 81 MG EC tablet Take 1 tablet (81 mg total) by mouth daily.    . diazepam (VALIUM) 5 MG tablet Take 1 tablet (5 mg total) by mouth at bedtime. 30 tablet 5  . furosemide (LASIX) 40 MG tablet Take 1 tablet by mouth  daily. Ok to take an extra tablet as needed for swelling. 60 tablet 3  . OVER THE COUNTER MEDICATION Apply 1 application topically at bedtime as needed (Leg Pain). Magni-Life topical cream for leg pain.    . potassium chloride (K-DUR) 10 MEQ tablet Take 1 tablet 2 (two) times daily. Take an extra 2 tablets on days you take extra Furosemide 120 tablet 0  . sertraline (ZOLOFT) 50 MG tablet Take 1 tablet (50 mg total) by mouth daily. (Patient taking differently: Take 25 mg by mouth as needed. ) 30 tablet 3  . SYMBICORT 80-4.5 MCG/ACT inhaler 2 PUFFS 2 TIMES A DAY 10.2 g 2  . triamterene-hydrochlorothiazide (MAXZIDE-25) 37.5-25 MG tablet Take 1 tablet by mouth daily. 90 tablet 3  . warfarin (COUMADIN) 2 MG tablet TAKE 1 TABLET ONCE DAILY AT 6PM, OR AS DIRECTED BY COUMADIN CLINIC 105 tablet 1  . doxazosin (CARDURA) 4 MG tablet Take 1 tablet (4 mg total) by mouth daily. 90 tablet 1   No current facility-administered medications for this visit.     Allergies:   Aldactone [spironolactone]; Amoxicillin; Diltiazem; Flagyl [metronidazole hcl]; Flovent [fluticasone propionate]; Gabapentin; Lyrica [pregabalin]; Quinidine; Simvastatin; Tramadol; Verapamil; Amlodipine; Ace inhibitors; Benazepril hcl; Ciprocin-fluocin-procin [fluocinolone acetonide]; Ciprofloxacin; Codeine; and Nitrofurantoin monohyd macro    Social History:  The patient  reports that she quit smoking about 42 years ago. Her smoking use included cigarettes. She has a 15.00 pack-year smoking history. She has never used smokeless tobacco. She reports that she does not drink alcohol or use drugs.   Family History:  The patient's family history includes Leukemia in her father.    ROS:  Please see the history of present illness.   Otherwise, review of systems are positive for chest wall soreness.   All other systems are reviewed and negative.    PHYSICAL EXAM: VS:  BP 122/70   Pulse 64   Ht 5' 4"  (1.626 m)   Wt 150 lb 9.6 oz (68.3 kg)   SpO2  95%   BMI 25.85 kg/m  , BMI Body mass index is 25.85 kg/m. GENERAL:  Well appearing HEENT: Pupils equal round and reactive, fundi not visualized, oral mucosa unremarkable NECK:  No jugular venous distention, waveform within normal limits, carotid upstroke brisk and symmetric, no bruits, no thyromegaly LYMPHATICS:  No cervical adenopathy LUNGS:  Clear to auscultation bilaterally HEART:  RRR.  PMI not displaced or sustained,S1 and S2 within normal limits, no  S3, no S4, no clicks, no rubs, II/VI systolic murmur at the LSUB ABD:  Flat, positive bowel sounds normal in frequency in pitch, no bruits, no rebound, no guarding, no midline pulsatile mass, no hepatomegaly, no splenomegaly EXT:  2 plus pulses throughout, no edema, no cyanosis no clubbing SKIN:  No rashes no nodules NEURO:  Cranial nerves II through XII grossly intact, motor grossly intact throughout PSYCH:  Cognitively intact, oriented to person place and time    EKG:  EKG is not ordered today. The ekg ordered 05/21/16 demonstrates atrial fibrillateion.  Prior septal infarct.  Rate 60 bpm.  Unchanged from 10/22/15. 10/27/16: Sinus rhythm.  Rate 60 bpm.  First degree heart block. 02/01/17: Sinus bradycardia with sinus arrhythmia and first degree block.   03/08/17: Sinus bradycardia with sinus arrhythmia.   First degree heart block.  Junctional escape.  QTc 497 ms.   04/26/17: Atrial fibrillation.  Rate 69 bpm. 01/05/18: Accelerated junctional rhythm.  Rate 68 bpm.  Incomplete RBBB.   Echo 02/15/17: Study Conclusions  - Left ventricle: The cavity size was normal. There was mild   concentric hypertrophy. Systolic function was normal. The   estimated ejection fraction was in the range of 60% to 65%. Wall   motion was normal; there were no regional wall motion   abnormalities. - Aortic valve: There was very mild stenosis. There was no   regurgitation. Peak velocity (S): 232 cm/s. Mean gradient (S): 12   mm Hg. - Mitral valve: A 52m  Medtronic Mosaic bioprosthesis was present   and functioning properly. There was trivial regurgitation. - Left atrium: The atrium was severely dilated. - Right ventricle: The cavity size was mildly dilated. Wall   thickness was normal. Systolic function was normal. - Tricuspid valve: There was moderate regurgitation. - Pulmonary arteries: Systolic pressure was mildly increased. PA   peak pressure: 41 mm Hg (S).  Echo 06/07/16:  Study Conclusions  - Left ventricle: The cavity size was normal. Wall thickness was   normal. Systolic function was vigorous. The estimated ejection   fraction was in the range of 65% to 70%. - Mitral valve: MV is thickened. with some restricted motion Peak   and mean gradients through the valve are 22 and 8 mm Hg   respectively MVA by Pt1/2 is 1.9 cm2. Calcified annulus. Mildly   thickened leaflets . There was moderate to severe regurgitation. - Left atrium: The atrium was massively dilated. - Right atrium: The atrium was mildly dilated.  LHC/RHC 08/03/16: The left ventricular systolic function is normal.  LV end diastolic pressure is normal.  The left ventricular ejection fraction is 55-65% by visual estimate.  There is no aortic valve stenosis.  There is severe mitral valve stenosis.  Prox Cx to Mid Cx lesion, 50 %stenosed.  Mid RCA-2 lesion, 80 %stenosed.  Mid RCA-1 lesion, 80 %stenosed.  Hemodynamic findings consistent with mild pulmonary hypertension.  LV end diastolic pressure is normal.   1. Coronary artery disease   - 50% mid LCx   - 80% sequential lesions in the mid RCA 2. Normal LV function 3. Severe mitral stenosis. MV gradient of 13 mm Hg. MVA 0.99 cm squared with index 0.57. 4. Moderate to severe mitral insufficiency 5. Mild pulmonary HTN. 6. Normal LV EDP 7. No significant AV gradient 8. Occluded right brachial artery.  Carotid Dopplers 10/12/16: 1-39% bilateral ICA stenosis  7 Day Event Monitor 06/28/17:  Quality: Fair.   Baseline artifact. Predominant rhythm: Sinus rhythm with PACs and  sinus arrhythmia.    Cannot  rule out episodes of atrial fibrillation.  Recent Labs: 12/13/2017: Hemoglobin 14.4; Platelets 270.0 01/05/2018: ALT 24 01/18/2018: BUN 12; Creatinine, Ser 0.95; Potassium 3.3; Sodium 142    Lipid Panel    Component Value Date/Time   CHOL 252 (H) 01/05/2018 1221   TRIG 162 (H) 01/05/2018 1221   HDL 58 01/05/2018 1221   CHOLHDL 4.3 01/05/2018 1221   CHOLHDL 3 12/20/2016 1112   VLDL 36.8 12/20/2016 1112   LDLCALC 162 (H) 01/05/2018 1221   LDLDIRECT 134.7 08/28/2014 1009      Wt Readings from Last 3 Encounters:  05/23/18 150 lb 9.6 oz (68.3 kg)  01/18/18 150 lb 6.4 oz (68.2 kg)  01/05/18 150 lb 3.2 oz (68.1 kg)      ASSESSMENT AND PLAN:  # Rheumatic mitral valve disease: # s/p bioprosthetic mitral valve: Ms. Pates is doing well.  She has no evidence of heart failure on exam.  Her volume status is well-controlled.  Continue furosemide.  She needs antibiotics prior to dental procedures.  # Mild aortic stenosis: Mean gradient 12 mmHg 01/2017.  Repeat echo at follow up.  # Persistent atrial fibrillation:  # Bradycardia: # Palpitations: Rate well-controlled.  Continue warfarin.   # Hypertension:  Blood pressure is mostly controlled at home.  She has developed a cough since increasing losartan.  We will stop it and start doxazosin 4 mg instead.  Continue HCTZ-triamterene.   She will continue to track her BP and bring to follow up.    # Nonobstructive coronary disease: # Hyperlipidemia: Has not tolerated statins and she now has muscle cramps on Zetia.  We will refer her to pharmacy for a PCSK9 inhibitor.  # Fatigue: # hair loss: Check TSH.    Current medicines are reviewed at length with the patient today.  The patient does not have concerns regarding medicines.  The following changes have been made:  none  Labs/ tests ordered today include:   Orders Placed This Encounter    Procedures  . TSH  . Lipid panel  . Comprehensive metabolic panel    Disposition:   FU with Juniel Groene C. Oval Linsey, MD, [facility] in 3 months.  PharmD in 1 month for BP and PCSK9 inhibitor.     Signed, [place] Dube C. Oval Linsey, MD, [facility]  05/23/2018 10:56 AM    [place]

## 2018-05-23 NOTE — Patient Instructions (Addendum)
Medication Instructions:  STOP LOSARTAN   STOP ZETIA (EZETIMIBE)  START DOXAZOSIN 4 MG   Labwork: NONE  Testing/Procedures: NONE  Follow-Up: Your physician recommends that you schedule a follow-up appointment in: PHARM D 2-4 WEEKS FOR LIPID AND BLOOD PRESSURE  Your physician recommends that you schedule a follow-up appointment in: 3 MONTHS WITH DR Dr [facility]   Any Other Special Instructions Will Be Listed Below (If Applicable).  MONITOR AND LOG YOUR BLOOD PRESSURE AT HOME, BRING TO FOLLOW UP    If you need a refill on your cardiac medications before your next appointment, please call your pharmacy.

## 2018-05-24 ENCOUNTER — Telehealth: Payer: Self-pay | Admitting: *Deleted

## 2018-05-24 NOTE — Telephone Encounter (Signed)
Discussed with patient and will keep appointment with Pharm D as scheduled

## 2018-05-24 NOTE — Telephone Encounter (Signed)
Received a call from patients pharmacy regarding Repatha being sent in. Patient has appointment with Lipid clinic 06/16/18 and they will discuss Repatha at that time. Left message to call back to explain to her

## 2018-05-31 ENCOUNTER — Ambulatory Visit (INDEPENDENT_AMBULATORY_CARE_PROVIDER_SITE_OTHER): Payer: Medicare Other | Admitting: Family Medicine

## 2018-05-31 ENCOUNTER — Encounter: Payer: Self-pay | Admitting: Family Medicine

## 2018-05-31 VITALS — BP 120/58 | HR 76 | Temp 98.3°F | Wt 153.2 lb

## 2018-05-31 DIAGNOSIS — S8012XA Contusion of left lower leg, initial encounter: Secondary | ICD-10-CM | POA: Diagnosis not present

## 2018-05-31 DIAGNOSIS — T148XXA Other injury of unspecified body region, initial encounter: Secondary | ICD-10-CM

## 2018-05-31 NOTE — Progress Notes (Signed)
Catherine [name] DOB: Feb 25, 1935 Encounter date: 05/31/2018  This is a 83 y.o. female who presents with Chief Complaint  Patient presents with  . knott on leg    left leg, pt does not remember bumping into anything, complains of soreness,     History of present illness:  Was seen by derm and had infection after biopsy so was put on Keflex due to infection. Started having some diarrhea.   Noticed knot on leg yesterday evening when pulling pants down. Was more raised yesterday. Tender. No memory of injury to leg. Seems about the same in terms of tenderness.     Allergies  Allergen Reactions  . Aldactone [Spironolactone] Other (See Comments)    dyspnea  . Amoxicillin Palpitations    Tachycardia Has patient had a PCN reaction causing immediate rash, facial/tongue/throat swelling, SOB or lightheadedness with hypotension: no Has patient had a PCN reaction causing severe rash involving mucus membranes or skin necrosis: {no Has patient had a PCN reaction that required hospitalization {no Has patient had a PCN reaction occurring within the last 10 years: {yes If all of the above answers are "NO", then may proceed with Cephalosporin use.  . Diltiazem Other (See Comments)    Causing headaches   . Flagyl [Metronidazole Hcl] Other (See Comments)    Causing headaches    . Flovent [Fluticasone Propionate] Other (See Comments)    Leg cramps  . Gabapentin Swelling  . Lyrica [Pregabalin] Swelling  . Quinidine Diarrhea and Other (See Comments)    Fever diarrhea  . Simvastatin Other (See Comments)    Leg pain, myalgia  . Tramadol Nausea Only  . Verapamil Other (See Comments)    myalgias  . Amlodipine     Low extremity edema  . Zetia [Ezetimibe]     LEG CRAMPS   . Ace Inhibitors Other (See Comments)    unknown  . Benazepril Hcl Cough  . Ciprocin-Fluocin-Procin [Fluocinolone Acetonide] Other (See Comments)    unknown  . Ciprofloxacin Diarrhea  . Codeine Nausea Only  .  Nitrofurantoin Monohyd Macro Nausea Only   Current Meds  Medication Sig  . acetaminophen (TYLENOL) 500 MG tablet Take 500 mg by mouth every 6 (six) hours as needed for mild pain.  Marland Kitchen albuterol (PROVENTIL HFA;VENTOLIN HFA) 108 (90 Base) MCG/ACT inhaler Inhale 2 puffs into the lungs every 6 (six) hours as needed for wheezing or shortness of breath.  Marland Kitchen aspirin EC 81 MG EC tablet Take 1 tablet (81 mg total) by mouth daily.  . diazepam (VALIUM) 5 MG tablet Take 1 tablet (5 mg total) by mouth at bedtime.  Marland Kitchen doxazosin (CARDURA) 4 MG tablet Take 1 tablet (4 mg total) by mouth daily.  . furosemide (LASIX) 40 MG tablet Take 1 tablet by mouth daily. Ok to take an extra tablet as needed for swelling.  Marland Kitchen OVER THE COUNTER MEDICATION Apply 1 application topically at bedtime as needed (Leg Pain). Magni-Life topical cream for leg pain.  . potassium chloride (K-DUR) 10 MEQ tablet Take 1 tablet 2 (two) times daily. Take an extra 2 tablets on days you take extra Furosemide  . sertraline (ZOLOFT) 50 MG tablet Take 1 tablet (50 mg total) by mouth daily. (Patient taking differently: Take 25 mg by mouth as needed. )  . SYMBICORT 80-4.5 MCG/ACT inhaler 2 PUFFS 2 TIMES A DAY  . triamterene-hydrochlorothiazide (MAXZIDE-25) 37.5-25 MG tablet Take 1 tablet by mouth daily.  Marland Kitchen warfarin (COUMADIN) 2 MG tablet TAKE 1 TABLET ONCE DAILY AT  6PM, OR AS DIRECTED BY COUMADIN CLINIC    Review of Systems  Constitutional: Negative for activity change and appetite change.  Respiratory: Negative for chest tightness, shortness of breath and wheezing.   Cardiovascular: Positive for leg swelling (chronic; salt sensitive). Negative for chest pain and palpitations.  Skin:       See hpi; previous skin infection is healing up nicely     Objective:  BP (!) 120/58 (BP Location: Left Arm, Patient Position: Sitting, Cuff Size: Normal)   Pulse 76   Temp 98.3 F (36.8 C) (Oral)   Wt 153 lb 3.2 oz (69.5 kg)   SpO2 95%   BMI 26.30 kg/m    Weight: 153 lb 3.2 oz (69.5 kg)   BP Readings from Last 3 Encounters:  05/31/18 (!) 120/58  05/23/18 122/70  01/18/18 118/66   Wt Readings from Last 3 Encounters:  05/31/18 153 lb 3.2 oz (69.5 kg)  05/23/18 150 lb 9.6 oz (68.3 kg)  01/18/18 150 lb 6.4 oz (68.2 kg)    Physical Exam  Constitutional: She appears well-developed and well-nourished. No distress.  Cardiovascular: Normal rate, regular rhythm and normal heart sounds. Exam reveals no friction rub.  No murmur heard. There is trace edema RLE; there is 1+ edema LLE.   Negative Homans.   Pulmonary/Chest: Effort normal and breath sounds normal. No respiratory distress. She has no wheezes. She has no rales.  Skin:  Anterior left shin - healing cellulitis with central scab. No warmth, no current erythema.   Anterior-lateral left lower extremity approx 3cm area with echymosis. It is tender to touch and there is diffuse underlying edema under bruise. Slight surrounding tenderness.     Psychiatric: She has a normal mood and affect.    Assessment/Plan 1. Hematoma  Keep legs elevated above heart level whenever sitting/lying down. This will help with swelling in lower extremities. OK to apply heat to bruised are of leg. This may speed healing. You can apply heat up to a few times daily.   Let us know if any worsening of bruising or swelling, but should expect a couple of weeks for bruised area to resolve.       Micheline Rough, MD

## 2018-05-31 NOTE — Patient Instructions (Signed)
OK to stop your keflex.   Keep legs elevated above heart level whenever sitting/lying down. This will help with swelling in lower extremities. OK to apply heat to bruised are of leg. This may speed healing. You can apply heat up to a few times daily.   Let us know if any worsening of bruising or swelling, but should expect a couple of weeks for bruised area to resolve.

## 2018-06-03 ENCOUNTER — Other Ambulatory Visit: Payer: Self-pay | Admitting: Cardiovascular Disease

## 2018-06-16 ENCOUNTER — Ambulatory Visit: Payer: Medicare Other

## 2018-06-19 ENCOUNTER — Other Ambulatory Visit: Payer: Self-pay | Admitting: Family Medicine

## 2018-06-19 NOTE — Telephone Encounter (Signed)
Last OV 01/18/18, Coumadin clinic 06/26/18  Last filled 12/23/17, # 30 with 5 refills

## 2018-06-19 NOTE — Telephone Encounter (Signed)
Refill for 6 months. 

## 2018-06-26 ENCOUNTER — Ambulatory Visit: Payer: Medicare Other

## 2018-06-28 ENCOUNTER — Ambulatory Visit: Payer: Medicare Other

## 2018-06-28 ENCOUNTER — Ambulatory Visit (INDEPENDENT_AMBULATORY_CARE_PROVIDER_SITE_OTHER): Payer: Medicare Other | Admitting: General Practice

## 2018-06-28 DIAGNOSIS — I4891 Unspecified atrial fibrillation: Secondary | ICD-10-CM | POA: Diagnosis not present

## 2018-06-28 LAB — POCT INR: INR: 3.1 — AB (ref 2.0–3.0)

## 2018-06-28 NOTE — Patient Instructions (Addendum)
Pre visit review using our clinic review tool, if applicable. No additional management support is needed unless otherwise documented below in the visit note.  Skip coumadin today and then continue to take 1 tablet every day except 2 tablets on Mondays.  Re-check in 4 weeks.

## 2018-06-29 ENCOUNTER — Telehealth: Payer: Self-pay | Admitting: *Deleted

## 2018-06-29 ENCOUNTER — Ambulatory Visit (INDEPENDENT_AMBULATORY_CARE_PROVIDER_SITE_OTHER): Payer: Medicare Other | Admitting: Pharmacist Clinician (PhC)/ Clinical Pharmacy Specialist

## 2018-06-29 DIAGNOSIS — R0602 Shortness of breath: Secondary | ICD-10-CM

## 2018-06-29 DIAGNOSIS — E785 Hyperlipidemia, unspecified: Secondary | ICD-10-CM

## 2018-06-29 DIAGNOSIS — R600 Localized edema: Secondary | ICD-10-CM

## 2018-06-29 DIAGNOSIS — I10 Essential (primary) hypertension: Secondary | ICD-10-CM | POA: Diagnosis not present

## 2018-06-29 LAB — BASIC METABOLIC PANEL
BUN/Creatinine Ratio: 16 (ref 12–28)
BUN: 14 mg/dL (ref 8–27)
CALCIUM: 9.9 mg/dL (ref 8.7–10.3)
CHLORIDE: 99 mmol/L (ref 96–106)
CO2: 29 mmol/L (ref 20–29)
Creatinine, Ser: 0.87 mg/dL (ref 0.57–1.00)
GFR, EST AFRICAN AMERICAN: 73 mL/min/{1.73_m2} (ref >59)
GFR, EST NON AFRICAN AMERICAN: 63 mL/min/{1.73_m2} (ref >59)
Glucose: 106 mg/dL — ABNORMAL HIGH (ref 65–99)
Potassium: 3.2 mmol/L — ABNORMAL LOW (ref 3.5–5.2)
Sodium: 146 mmol/L — ABNORMAL HIGH (ref 134–144)

## 2018-06-29 LAB — PRO B NATRIURETIC PEPTIDE: NT-Pro BNP: 590 pg/mL (ref 0–738)

## 2018-06-29 NOTE — Patient Instructions (Addendum)
Call in 2 weeks to let me know what your BP is doing.  We will also discuss the cost of Repatha/Praluent Call [phone]   Your blood pressure today is 136/62 (goal is < 130/80)  Check your blood pressure at home daily and keep record of the readings.  Take your BP meds as follows:  Move the doxazosin from mornings to evenings  Continue all other medications  Bring all of your meds, your BP cuff and your record of home blood pressures to your next appointment.  Exercise as you're able, try to walk approximately 30 minutes per day.  Keep salt intake to a minimum, especially watch canned and prepared boxed foods.  Eat more fresh fruits and vegetables and fewer canned items.  Avoid eating in fast food restaurants.    HOW TO TAKE YOUR BLOOD PRESSURE: . Rest 5 minutes before taking your blood pressure. .  Don't smoke or drink caffeinated beverages for at least 30 minutes before. . Take your blood pressure before (not after) you eat. . Sit comfortably with your back supported and both feet on the floor (don't cross your legs). . Elevate your arm to heart level on a table or a desk. . Use the proper sized cuff. It should fit smoothly and snugly around your bare upper arm. There should be enough room to slip a fingertip under the cuff. The bottom edge of the cuff should be 1 inch above the crease of the elbow. . Ideally, take 3 measurements at one sitting and record the average.

## 2018-06-29 NOTE — Assessment & Plan Note (Signed)
Patient with elevated LDL, would like to consider Repatha.  Unfortunately she is currently in the donut hole, so is concerned that she may need to wait until Jan 1 to be able to afford.  We will go ahead and process the paperwork for Repatha, hopefully the PA will be good until this time next year.  We can then sample for 1-2 months and have her get her first prescription after January 1.

## 2018-06-29 NOTE — Telephone Encounter (Signed)
Patient in the office today with her husband. She has been having edema in feet/ankles. She has been taking double her Furosemide for about a week and a half. Still having wt gain of 3 pounds in 24 hours at times, with some increased shortness of breath at times.  Per Dr Oval Linsey Echo, BNP, & BMET. Advised patient and labs done today.

## 2018-06-29 NOTE — Assessment & Plan Note (Signed)
Patient with essential hypertension, now doing well on doxazosin 4 mg daily.  I have asked her to move this to bedtime, to avoid any orthostatic problems.  Her pressure is otherwise right at goal.   I have asked her to continue with daily home monitoring for a few weeks and call with a weekly average in about 10-14 days.  Because of her medication sensitivities, we are hard pressed to find something new, but could increase her dose of doxazosin should she require further titration.

## 2018-06-29 NOTE — Progress Notes (Signed)
06/29/2018 [name] 08/21/1935 [id number]   HPI:  Catherine [name] is a 83 y.o. female patient of Dr Oval Linsey, with a [place] below who presents today for hypertension clinic evaluation.  In addition to hypertension, her medical history is significant for CAS (s/p CABG), bioprosthetic mitral valve (s/p rheumatic disease), mild aortic and carotid stenosis, anxiety and COPD.   She has multiple drug sensitivities, but until recently has been able to keep her blood pressure controlled.  Most recently she developed a cough on losartan and was switched to doxazosin.  See below for cardiac medication intolerances.    Today she comes in for follow up.  She has been on the doxazosin for about 4-5 weeks now.  She has no complaints or concerns about it.  She has been taking each morning with her triamterene/hctz.  Of note, she still has a cough, notes it is worse at night.    A recent lipid panel shows her LDL at 136 while on rosuvastatin 3 times weekly.  This is down from baseline of 162 earlier this year.  She tried a couple of different statin drugs, but was unable to take for any length of time due to myalgias.      Blood Pressure Goal:  130/80  Current Medications:  Doxazosin 4 mg qd  Furosemide 40 mg qd  Triamterene/HCTZ 37.5/25 mg qd  Social Hx:  She is a former smoker, having quit back in the 1970's; no alcohol Diet:   Does eat out regularly, mostly chicken and salads, but has a biscuit about once wekly at [employer] after her [employer] silver sneakers class.  Exercise:  Y - three times per week - silver sneakers  Home BP readings:  Checked at home for past month, have 24 readings, with a range of 109-147/57-100.  The average reading was 131/67.  Only 2 readings were > 619 systolic and 1 < 509.    Intolerances:   Amlodipine - LEE  Diltiazem - headaches  Verapamil - myalgias  ACEI - cough  Losartan - cough  Labs:  04/2018: Na 145, K 3.5, Glu 103, BUN 12, SCr 0.94   Wt Readings from  Last 3 Encounters:  05/31/18 153 lb 3.2 oz (69.5 kg)  05/23/18 150 lb 9.6 oz (68.3 kg)  01/18/18 150 lb 6.4 oz (68.2 kg)   BP Readings from Last 3 Encounters:  06/29/18 136/62  05/31/18 (!) 120/58  05/23/18 122/70   Pulse Readings from Last 3 Encounters:  06/29/18 60  05/31/18 76  05/23/18 64    Current Outpatient Medications  Medication Sig Dispense Refill  . acetaminophen (TYLENOL) 500 MG tablet Take 500 mg by mouth every 6 (six) hours as needed for mild pain.    Marland Kitchen albuterol (PROVENTIL HFA;VENTOLIN HFA) 108 (90 Base) MCG/ACT inhaler Inhale 2 puffs into the lungs every 6 (six) hours as needed for wheezing or shortness of breath. 1 Inhaler 0  . aspirin EC 81 MG EC tablet Take 1 tablet (81 mg total) by mouth daily.    . diazepam (VALIUM) 5 MG tablet TAKE ONE TABLET AT BEDTIME 30 tablet 5  . doxazosin (CARDURA) 4 MG tablet Take 1 tablet (4 mg total) by mouth daily. 90 tablet 1  . furosemide (LASIX) 40 MG tablet Take 1 tablet by mouth daily. Ok to take an extra tablet as needed for swelling. 60 tablet 3  . OVER THE COUNTER MEDICATION Apply 1 application topically at bedtime as needed (Leg Pain). Magni-Life topical  cream for leg pain.    . potassium chloride (K-DUR) 10 MEQ tablet Take 1 tablet 2 (two) times daily. Take an extra 2 tablets on days you take extra Furosemide 120 tablet 0  . potassium chloride (K-DUR) 10 MEQ tablet Take 1 tablet 2 (two) times daily. Take an extra 2 tablets on days you take extra Furosemide 120 tablet 3  . sertraline (ZOLOFT) 50 MG tablet Take 1 tablet (50 mg total) by mouth daily. (Patient taking differently: Take 25 mg by mouth as needed. ) 30 tablet 3  . SYMBICORT 80-4.5 MCG/ACT inhaler 2 PUFFS 2 TIMES A DAY 10.2 g 2  . triamterene-hydrochlorothiazide (MAXZIDE-25) 37.5-25 MG tablet Take 1 tablet by mouth daily. 90 tablet 3  . warfarin (COUMADIN) 2 MG tablet TAKE 1 TABLET ONCE DAILY AT 6PM, OR AS DIRECTED BY COUMADIN CLINIC 105 tablet 1   No current  facility-administered medications for this visit.     Allergies  Allergen Reactions  . Aldactone [Spironolactone] Other (See Comments)    dyspnea  . Amoxicillin Palpitations    Tachycardia Has patient had a PCN reaction causing immediate rash, facial/tongue/throat swelling, SOB or lightheadedness with hypotension: no Has patient had a PCN reaction causing severe rash involving mucus membranes or skin necrosis: {no Has patient had a PCN reaction that required hospitalization {no Has patient had a PCN reaction occurring within the last 10 years: {yes If all of the above answers are "NO", then may proceed with Cephalosporin use.  . Diltiazem Other (See Comments)    Causing headaches   . Flagyl [Metronidazole Hcl] Other (See Comments)    Causing headaches    . Flovent [Fluticasone Propionate] Other (See Comments)    Leg cramps  . Gabapentin Swelling  . Lyrica [Pregabalin] Swelling  . Quinidine Diarrhea and Other (See Comments)    Fever diarrhea  . Simvastatin Other (See Comments)    Leg pain, myalgia  . Tramadol Nausea Only  . Verapamil Other (See Comments)    myalgias  . Amlodipine     Low extremity edema  . Zetia [Ezetimibe]     LEG CRAMPS   . Ace Inhibitors Other (See Comments)    unknown  . Benazepril Hcl Cough  . Ciprocin-Fluocin-Procin [Fluocinolone Acetonide] Other (See Comments)    unknown  . Ciprofloxacin Diarrhea  . Codeine Nausea Only  . Nitrofurantoin Monohyd Macro Nausea Only    Past Medical History:  Diagnosis Date  . Aortic stenosis, mild 07/26/2017  . Arthritis   . Asthma    last attack 02/2015  . Atrial fibrillation, chronic   . Cellulitis of left lower extremity 08/17/2016   Ulcer associated with severe venous insufficiency  . Chronic anticoagulation   . Chronic diastolic CHF (congestive heart failure) ([place])   . Chronic kidney disease    "RIGHT MANY KIDNEY INFECTIONS AND STONES"  . COPD (chronic obstructive pulmonary disease) ([place])   .  Coronary artery disease   . Dizziness   . H/O: rheumatic fever   . Heart murmur   . Hypertension   . PONV (postoperative nausea and vomiting)    ' SOMETIMES', BUT NOT ALWAYS"  . S/P Maze operation for atrial fibrillation 10/14/2016   Complete bilateral atrial lesion set using cryothermy and bipolar radiofrequency ablation - atrial appendage was not treated due to previous surgical procedure (open mitral commissurotomy)  . S/P mitral valve replacement with bioprosthetic valve 10/14/2016   29 mm Medtronic Mosaic porcine bioprosthetic tissue valve  . UTI (urinary tract infection)   .  Valvular heart disease    Has mitral stenosis with prior mitral commissurotomy in 1970    Blood pressure 136/62, pulse 60.  Essential hypertension Patient with essential hypertension, now doing well on doxazosin 4 mg daily.  I have asked her to move this to bedtime, to avoid any orthostatic problems.  Her pressure is otherwise right at goal.   I have asked her to continue with daily home monitoring for a few weeks and call with a weekly average in about 10-14 days.  Because of her medication sensitivities, we are hard pressed to find something new, but could increase her dose of doxazosin should she require further titration.    Dyslipidemia Patient with elevated LDL, would like to consider Repatha.  Unfortunately she is currently in the donut hole, so is concerned that she may need to wait until Jan 1 to be able to afford.  We will go ahead and process the paperwork for Repatha, hopefully the PA will be good until this time next year.  We can then sample for 1-2 months and have her get her first prescription after January 1.    Tommy Medal PharmD CPP [facility] [address] [place], [place] [address] [phone]

## 2018-07-03 ENCOUNTER — Other Ambulatory Visit (HOSPITAL_COMMUNITY): Payer: Medicare Other

## 2018-07-05 ENCOUNTER — Telehealth: Payer: Self-pay | Admitting: Internal Medicine

## 2018-07-05 MED ORDER — BUDESONIDE-FORMOTEROL FUMARATE 80-4.5 MCG/ACT IN AERO: 2.0000 | INHALATION_SPRAY | Freq: Two times a day (BID) | RESPIRATORY_TRACT | 0 refills | Status: DC

## 2018-07-05 MED ORDER — BUDESONIDE-FORMOTEROL FUMARATE 80-4.5 MCG/ACT IN AERO: INHALATION_SPRAY | RESPIRATORY_TRACT | 5 refills | Status: DC

## 2018-07-05 NOTE — Telephone Encounter (Signed)
Called and spoke with pt who stated she was needing some samples of Symb 80 as well as an Rx to be sent to her pharmacy.  Stated to pt I would put samples up front for her and also send Rx in to pharmacy for her. Pt verified preferred pharmacy.  Rx sent in and samples have been placed up front. Nothing further needed.

## 2018-07-06 ENCOUNTER — Other Ambulatory Visit: Payer: Self-pay

## 2018-07-06 ENCOUNTER — Ambulatory Visit (HOSPITAL_COMMUNITY): Payer: Medicare Other | Attending: Internal Medicine

## 2018-07-06 DIAGNOSIS — R0602 Shortness of breath: Secondary | ICD-10-CM | POA: Diagnosis not present

## 2018-07-06 DIAGNOSIS — R600 Localized edema: Secondary | ICD-10-CM

## 2018-07-06 MED ORDER — PERFLUTREN LIPID MICROSPHERE
1.0000 mL | INTRAVENOUS | Status: AC | PRN
  Administered 2018-07-06: 3.5 mL via INTRAVENOUS

## 2018-07-10 DIAGNOSIS — L0100 Impetigo, unspecified: Secondary | ICD-10-CM | POA: Diagnosis not present

## 2018-07-10 DIAGNOSIS — C44729 Squamous cell carcinoma of skin of left lower limb, including hip: Secondary | ICD-10-CM | POA: Diagnosis not present

## 2018-07-11 ENCOUNTER — Telehealth: Payer: Self-pay | Admitting: Cardiovascular Disease

## 2018-07-11 ENCOUNTER — Telehealth: Payer: Self-pay | Admitting: Pharmacist Clinician (PhC)/ Clinical Pharmacy Specialist

## 2018-07-11 NOTE — Telephone Encounter (Signed)
New Message:    Patient calling about some medication that her dermatology put on. Also Patient stated that she need some results.

## 2018-07-11 NOTE — Telephone Encounter (Signed)
Spoke with pt. Pt sts that she sts that she had a spot removed from her leg in July 2019, this was done with her dermatologist. Pt sts that the area is infected. She was seen by her Dermatologist yesterday, he would like to start her on Voxycycline 100mg  daily for 14 days. She was  Told to contact her Cardiologist office regarding possible drug interactions prior to starting. Adv pt that I will fad a message to our Pharmacist to advise. Adv pt that her Coumadin is managed at her PCP office she should contact them asap to notify them as well.   Pt is also requesting the results from her 10/10 echo. Adv her that it has not been reviewed by Dr.[place] as yet. I will fwd her the message.

## 2018-07-11 NOTE — Telephone Encounter (Signed)
Patient need to contact her coumadin clinic [facility]) for INR monitoring due to potential drug-drug interaction between Doxycycline and warfarin.   No other interaction is expected with current cardiac medication.

## 2018-07-11 NOTE — Telephone Encounter (Signed)
Patient called to report home BP readings.  Has checked daily for past 12 days. Average 126/62 with range of 99-139/57-68.  She has been feeling well, no concerns.    Advised that she continue medications without change.  She should still check home BP readings 2-4 times per week.  Should those readings increase to 601 systolic, she should check daily and call after 3-5 days with those readings.  Patient voiced understanding.

## 2018-07-17 ENCOUNTER — Ambulatory Visit (INDEPENDENT_AMBULATORY_CARE_PROVIDER_SITE_OTHER): Payer: Medicare Other | Admitting: General Practice

## 2018-07-17 DIAGNOSIS — Z5181 Encounter for therapeutic drug level monitoring: Secondary | ICD-10-CM | POA: Diagnosis not present

## 2018-07-17 DIAGNOSIS — Z7901 Long term (current) use of anticoagulants: Secondary | ICD-10-CM | POA: Diagnosis not present

## 2018-07-17 DIAGNOSIS — I4821 Permanent atrial fibrillation: Secondary | ICD-10-CM | POA: Diagnosis not present

## 2018-07-17 LAB — POCT INR: INR: 3.3 — AB (ref 2.0–3.0)

## 2018-07-17 NOTE — Patient Instructions (Addendum)
Pre visit review using our clinic review tool, if applicable. No additional management support is needed unless otherwise documented below in the visit note.  Skip coumadin today (10/21) and then continue to take 1 tablet every day except 2 tablets on Mondays.  Re-check in 4 weeks.

## 2018-07-18 ENCOUNTER — Other Ambulatory Visit: Payer: Self-pay | Admitting: General Practice

## 2018-07-18 MED ORDER — FLUCONAZOLE 150 MG PO TABS: 150.0000 mg | ORAL_TABLET | Freq: Once | ORAL | 0 refills | Status: AC

## 2018-07-24 ENCOUNTER — Ambulatory Visit: Payer: Medicare Other

## 2018-07-27 DIAGNOSIS — C44729 Squamous cell carcinoma of skin of left lower limb, including hip: Secondary | ICD-10-CM | POA: Diagnosis not present

## 2018-07-31 ENCOUNTER — Ambulatory Visit (INDEPENDENT_AMBULATORY_CARE_PROVIDER_SITE_OTHER): Payer: Medicare Other | Admitting: General Practice

## 2018-07-31 DIAGNOSIS — I4891 Unspecified atrial fibrillation: Secondary | ICD-10-CM | POA: Diagnosis not present

## 2018-07-31 LAB — POCT INR: INR: 2 (ref 2.0–3.0)

## 2018-07-31 NOTE — Patient Instructions (Signed)
Pre visit review using our clinic review tool, if applicable. No additional management support is needed unless otherwise documented below in the visit note.  Continue to take 1 tablet every day except 2 tablets on Mondays.  Re-check in 4 weeks.

## 2018-08-02 ENCOUNTER — Other Ambulatory Visit: Payer: Self-pay | Admitting: Family Medicine

## 2018-08-02 NOTE — Telephone Encounter (Signed)
Last OV 07/31/18, Next OV 08/28/18 (Coumadin clinic OV's)  Last filled 01/30/18, # [phone] with 1 refill

## 2018-08-08 ENCOUNTER — Ambulatory Visit: Payer: Medicare Other | Admitting: Cardiovascular Disease

## 2018-08-21 ENCOUNTER — Telehealth: Payer: Self-pay | Admitting: Internal Medicine

## 2018-08-21 NOTE — Telephone Encounter (Signed)
Called and spoke with patient advised that I have placed two samples up front for her. Nothing further needed.

## 2018-08-28 ENCOUNTER — Ambulatory Visit: Payer: Medicare Other

## 2018-08-30 ENCOUNTER — Ambulatory Visit: Payer: Self-pay

## 2018-08-30 NOTE — Telephone Encounter (Signed)
Patient called in with c/o "fever, cough." She says "I've had a fever now for 2-3 days, coughing up brownish looking sputum sometimes, when it comes up. Mostly the sputum comes up into my throat and I just swallow it back, because it won't come up enough for me to get it out. I have been in the bed for 2 days, just don't want to get out because I have chills, my appetite is not good. I think I have the flu." I asked about breathing, she says "I get SOB and have been using my inhalers more than usual, every 4 hours. The symbicort seems to work best." I asked about other symptoms, she says "wheezing and runny nose." According to protocol, see PCP within 24 hours. She says "Dr. Elease Hashimoto is out of the office. I will just go to the walk in clinic at [facility]." I advised there are no walk-in clinics, but I can schedule with another provider at [facility], she agrees. Appointment scheduled for tomorrow at 1430 with Dr. Ethlyn Gallery, care advice given, patient verbalized understanding.    Reason for Disposition . Fever present > 3 days (72 hours)  Answer Assessment - Initial Assessment Questions 1. ONSET: "When did the cough begin?"      Yesterday 2. SEVERITY: "How bad is the cough today?"      I cough every little bit, but the mucus comes up in my throat 3. RESPIRATORY DISTRESS: "Describe your breathing."     My breathing is not that good, SOB. Using inhalers every 4 hours 4. FEVER: "Do you have a fever?" If so, ask: "What is your temperature, how was it measured, and when did it start?"     Yes, 100.2; I'm freezing, can't get warm 5. SPUTUM: "Describe the color of your sputum" (clear, white, yellow, green)     Clear with a little dark in it, brown color 6. HEMOPTYSIS: "Are you coughing up any blood?" If so ask: "How much?" (flecks, streaks, tablespoons, etc.)    No 7. CARDIAC HISTORY: "Do you have any history of heart disease?" (e.g., heart attack, congestive heart failure)     Yes 8. LUNG  HISTORY: "Do you have any history of lung disease?"  (e.g., pulmonary embolus, asthma, emphysema)     Yes 9. PE RISK FACTORS: "Do you have a history of blood clots?" (or: recent major surgery, recent prolonged travel, bedridden)     No 10. OTHER SYMPTOMS: "Do you have any other symptoms?" (e.g., runny nose, wheezing, chest pain)       Runny nose, wheezing 11. PREGNANCY: "Is there any chance you are pregnant?" "When was your last menstrual period?"       No 12. TRAVEL: "Have you traveled out of the country in the last month?" (e.g., travel history, exposures)       No  Protocols used: [place]

## 2018-08-31 ENCOUNTER — Ambulatory Visit (INDEPENDENT_AMBULATORY_CARE_PROVIDER_SITE_OTHER): Payer: Medicare Other | Admitting: Family Medicine

## 2018-08-31 ENCOUNTER — Encounter: Payer: Self-pay | Admitting: Family Medicine

## 2018-08-31 VITALS — BP 100/50 | HR 47 | Temp 99.6°F | Wt 150.9 lb

## 2018-08-31 DIAGNOSIS — J4531 Mild persistent asthma with (acute) exacerbation: Secondary | ICD-10-CM

## 2018-08-31 DIAGNOSIS — R6889 Other general symptoms and signs: Secondary | ICD-10-CM

## 2018-08-31 DIAGNOSIS — R059 Cough, unspecified: Secondary | ICD-10-CM

## 2018-08-31 DIAGNOSIS — R05 Cough: Secondary | ICD-10-CM | POA: Diagnosis not present

## 2018-08-31 LAB — POC INFLUENZA A&B (BINAX/QUICKVUE)
INFLUENZA A, POC: NEGATIVE
Influenza B, POC: NEGATIVE

## 2018-08-31 MED ORDER — DOXYCYCLINE HYCLATE 100 MG PO TABS: 100.0000 mg | ORAL_TABLET | Freq: Two times a day (BID) | ORAL | 0 refills | Status: DC

## 2018-08-31 MED ORDER — METHYLPREDNISOLONE ACETATE 40 MG/ML IJ SUSP
40.0000 mg | Freq: Once | INTRAMUSCULAR | Status: AC
  Administered 2018-08-31: 60 mg via INTRAMUSCULAR

## 2018-08-31 MED ORDER — METHYLPREDNISOLONE ACETATE 80 MG/ML IJ SUSP: 60.0000 mg | Freq: Once | INTRAMUSCULAR | Status: DC

## 2018-08-31 MED ORDER — PREDNISONE 20 MG PO TABS: 40.0000 mg | ORAL_TABLET | Freq: Every day | ORAL | 0 refills | Status: AC

## 2018-08-31 NOTE — Progress Notes (Signed)
Catherine [name] DOB: 1935-08-11 Encounter date: 08/31/2018  This is a 83 y.o. female who presents with Chief Complaint  Patient presents with  . Cough    started monday, chills, fever of 100.2, chest congestion, productive cough,     History of present illness:  Not feeling well. No appetite. Has had diarrhea. Feels nauseated. Has been trying to take mucinex, pepto bismol. It has helped with diarrhea. Has had tylenol this morning. Breathing hasn't been good. Yesterday used symbicort BID; proair 3 times. This morning used symbicort and then used proair this morning and just before coming here. Feeling short of breath.   No sick contacts.     Allergies  Allergen Reactions  . Aldactone [Spironolactone] Other (See Comments)    dyspnea  . Amoxicillin Palpitations    Tachycardia Has patient had a PCN reaction causing immediate rash, facial/tongue/throat swelling, SOB or lightheadedness with hypotension: no Has patient had a PCN reaction causing severe rash involving mucus membranes or skin necrosis: {no Has patient had a PCN reaction that required hospitalization {no Has patient had a PCN reaction occurring within the last 10 years: {yes If all of the above answers are "NO", then may proceed with Cephalosporin use.  . Diltiazem Other (See Comments)    Causing headaches   . Flagyl [Metronidazole Hcl] Other (See Comments)    Causing headaches    . Flovent [Fluticasone Propionate] Other (See Comments)    Leg cramps  . Gabapentin Swelling  . Lyrica [Pregabalin] Swelling  . Quinidine Diarrhea and Other (See Comments)    Fever diarrhea  . Simvastatin Other (See Comments)    Leg pain, myalgia  . Tramadol Nausea Only  . Verapamil Other (See Comments)    myalgias  . Amlodipine     Low extremity edema  . Zetia [Ezetimibe]     LEG CRAMPS   . Ace Inhibitors Other (See Comments)    unknown  . Benazepril Hcl Cough  . Ciprocin-Fluocin-Procin [Fluocinolone Acetonide] Other (See  Comments)    unknown  . Ciprofloxacin Diarrhea  . Codeine Nausea Only  . Nitrofurantoin Monohyd Macro Nausea Only   Current Meds  Medication Sig  . acetaminophen (TYLENOL) 500 MG tablet Take 500 mg by mouth every 6 (six) hours as needed for mild pain.  Marland Kitchen albuterol (PROVENTIL HFA;VENTOLIN HFA) 108 (90 Base) MCG/ACT inhaler Inhale 2 puffs into the lungs every 6 (six) hours as needed for wheezing or shortness of breath.  Marland Kitchen aspirin EC 81 MG EC tablet Take 1 tablet (81 mg total) by mouth daily.  . budesonide-formoterol (SYMBICORT) 80-4.5 MCG/ACT inhaler 2 PUFFS 2 TIMES A DAY  . diazepam (VALIUM) 5 MG tablet TAKE ONE TABLET AT BEDTIME  . doxazosin (CARDURA) 4 MG tablet Take 1 tablet (4 mg total) by mouth daily.  . furosemide (LASIX) 40 MG tablet Take 1 tablet by mouth daily. Ok to take an extra tablet as needed for swelling.  Marland Kitchen OVER THE COUNTER MEDICATION Apply 1 application topically at bedtime as needed (Leg Pain). Magni-Life topical cream for leg pain.  . potassium chloride (K-DUR) 10 MEQ tablet Take 1 tablet 2 (two) times daily. Take an extra 2 tablets on days you take extra Furosemide  . triamterene-hydrochlorothiazide (MAXZIDE-25) 37.5-25 MG tablet Take 1 tablet by mouth daily.  Marland Kitchen warfarin (COUMADIN) 2 MG tablet Take 1 tablet daily except 2 on Mondays or AS DIRECTED BY ANTICOAGULATION CLINIC  90 DAY  . [DISCONTINUED] potassium chloride (K-DUR) 10 MEQ tablet Take 1 tablet 2 (  two) times daily. Take an extra 2 tablets on days you take extra Furosemide  . [DISCONTINUED] sertraline (ZOLOFT) 50 MG tablet Take 1 tablet (50 mg total) by mouth daily. (Patient taking differently: Take 25 mg by mouth as needed. )   Current Facility-Administered Medications for the 08/31/18 encounter (Office Visit) with Caren Macadam, MD  Medication  . methylPREDNISolone acetate (DEPO-MEDROL) injection 60 mg    Review of Systems  Constitutional: Negative for chills and fever.  HENT: Positive for congestion,  postnasal drip, sinus pressure, sinus pain and sore throat. Negative for ear pain.   Respiratory: Positive for cough, shortness of breath and wheezing.   Cardiovascular: Negative for chest pain.    Objective:  BP (!) 100/50 (BP Location: Left Arm, Patient Position: Sitting, Cuff Size: Normal)   Pulse (!) 47   Temp 99.6 F (37.6 C) (Oral)   Wt 150 lb 14.4 oz (68.4 kg)   SpO2 97%   BMI 25.90 kg/m   Weight: 150 lb 14.4 oz (68.4 kg)   BP Readings from Last 3 Encounters:  08/31/18 (!) 100/50  06/29/18 136/62  05/31/18 (!) 120/58   Wt Readings from Last 3 Encounters:  08/31/18 150 lb 14.4 oz (68.4 kg)  05/31/18 153 lb 3.2 oz (69.5 kg)  05/23/18 150 lb 9.6 oz (68.3 kg)    Physical Exam  Constitutional: She appears well-developed and well-nourished.  Non-toxic appearance. She appears ill.  HENT:  Head: Normocephalic and atraumatic.  Right Ear: Tympanic membrane, external ear and ear canal normal.  Left Ear: Tympanic membrane, external ear and ear canal normal.  Nose: Mucosal edema present. Right sinus exhibits no maxillary sinus tenderness and no frontal sinus tenderness. Left sinus exhibits no maxillary sinus tenderness and no frontal sinus tenderness.  Mouth/Throat: Uvula is midline and oropharynx is clear and moist.  Cardiovascular: Normal rate, regular rhythm and normal heart sounds.  Pulmonary/Chest: Effort normal. No respiratory distress. She has decreased breath sounds (slightly; at bases). She has no wheezes. She has rhonchi.    Assessment/Plan 1. Flu-like symptoms Flu testing negative in office.  - POC Influenza A&B(BINAX/QUICKVUE) - methylPREDNISolone acetate (DEPO-MEDROL) injection 40 mg  2. Cough Due to asthma and worsening of cough, will cover with doxycycline. Let us know if any worsening of symptoms or if not feeling significantly improved after weekend. - doxycycline (VIBRA-TABS) 100 MG tablet; Take 1 tablet (100 mg total) by mouth 2 (two) times daily.   Dispense: 20 tablet; Refill: 0  3. Mild persistent asthma with exacerbation Continue with 2puffs BID symbicort; recommend 2 puffs proair TID. Prednisone printed in case astma sx persist, but she has had issues with "climbing the walls" when taking prednisone in past. Advised she could try 20mg  daily instead of 40mg  if needed.  methylPREDNISolone acetate (DEPO-MEDROL) injection 40 mg    Return in about 1 week (around 09/07/2018), or coumadin recheck.    Micheline Rough, MD

## 2018-08-31 NOTE — Patient Instructions (Addendum)
Cut coumadin dosing down to 1 tab Monday and 0.5 tab other days while taking antibiotic. Recheck levels in 1-2 weeks.

## 2018-09-01 ENCOUNTER — Other Ambulatory Visit: Payer: Self-pay

## 2018-09-01 NOTE — Patient Outreach (Signed)
[facility]) Care Management  09/01/2018  Catherine [name] Sep 11, 1935 [id number]   Medication Adherence call to Catherine [name] spoke with patient she is no longer taking Losartan 100 mg doctor switch to Doxazosin 4 mg. Catherine [name] is showing past due under [place].   [facility] Management Direct Dial [phone]  Fax [phone] [email or web address]

## 2018-09-04 ENCOUNTER — Telehealth: Payer: Self-pay

## 2018-09-04 ENCOUNTER — Ambulatory Visit: Payer: Medicare Other

## 2018-09-04 NOTE — Telephone Encounter (Signed)
Left message for patient to call back. CRM created 

## 2018-09-04 NOTE — Telephone Encounter (Signed)
-----   Message from Caren Macadam, MD sent at 08/31/2018  4:08 PM EST ----- Check in on Monday - how is cough, breathing

## 2018-09-06 NOTE — Telephone Encounter (Signed)
Pt states she is feeling some better but has been bed most of the time since getting sick. She states that the antibiotic has been making her sick on her stomach. She states that she still has some chest congestion still, but also starting to lose her voice. She wants to see if she should get the prednisone filled and d/c the antibiotic? She is unable to eat when taking the doxycycline due to the nausea. She states since September she has been on 3 antibiotics and is unsure if she has had to much. CB#: [phone]

## 2018-09-08 NOTE — Telephone Encounter (Signed)
Discussed notes with patient. Nothing further needed.

## 2018-09-08 NOTE — Telephone Encounter (Signed)
Ok to stop antibiotic. She has had enough of a course. I worry that prednisone will also upset stomach, but she could try to take with food. I do think from asthma standpoint it would be helpful for her. She can try this through weekend (and experiment with dose she is comfortable with - we discussed 40mg  is my typical, but she feels on edge and may want to take 20mg  and then repeat dose in afternoon so she doesn't get large burst all at once. Don't take at night because she won't be able to sleep). Then have her update [name] on Monday!

## 2018-09-11 ENCOUNTER — Ambulatory Visit (INDEPENDENT_AMBULATORY_CARE_PROVIDER_SITE_OTHER): Payer: Medicare Other | Admitting: General Practice

## 2018-09-11 DIAGNOSIS — I4891 Unspecified atrial fibrillation: Secondary | ICD-10-CM

## 2018-09-11 LAB — POCT INR: INR: 1.4 — AB (ref 2.0–3.0)

## 2018-09-11 NOTE — Patient Instructions (Signed)
Pre visit review using our clinic review tool, if applicable. No additional management support is needed unless otherwise documented below in the visit note.  Take 3 tablets today (12/16) and take 2 tablets tomorrow and Wednesday ( 12/17 and 12/18) and then continue to take 1 tablet every day except 2 tablets on Mondays.  Re-check in 4 weeks.

## 2018-09-18 DIAGNOSIS — Z08 Encounter for follow-up examination after completed treatment for malignant neoplasm: Secondary | ICD-10-CM | POA: Diagnosis not present

## 2018-09-18 DIAGNOSIS — C44622 Squamous cell carcinoma of skin of right upper limb, including shoulder: Secondary | ICD-10-CM | POA: Diagnosis not present

## 2018-09-18 DIAGNOSIS — Z85828 Personal history of other malignant neoplasm of skin: Secondary | ICD-10-CM | POA: Diagnosis not present

## 2018-09-25 ENCOUNTER — Ambulatory Visit (INDEPENDENT_AMBULATORY_CARE_PROVIDER_SITE_OTHER): Payer: Medicare Other | Admitting: General Practice

## 2018-09-25 DIAGNOSIS — I4891 Unspecified atrial fibrillation: Secondary | ICD-10-CM

## 2018-09-25 LAB — POCT INR: INR: 4.5 — AB (ref 2.0–3.0)

## 2018-09-25 NOTE — Patient Instructions (Addendum)
Pre visit review using our clinic review tool, if applicable. No additional management support is needed unless otherwise documented below in the visit note.  Hold coumadin today and tomorrow  and then continue to take 1 tablet every day except 2 tablets on Mondays.  Re-check in 2 weeks at [facility]

## 2018-10-10 ENCOUNTER — Ambulatory Visit: Payer: Medicare Other | Admitting: Cardiovascular Disease

## 2018-10-10 ENCOUNTER — Ambulatory Visit (INDEPENDENT_AMBULATORY_CARE_PROVIDER_SITE_OTHER): Payer: Medicare Other | Admitting: General Practice

## 2018-10-10 ENCOUNTER — Encounter: Payer: Self-pay | Admitting: Cardiovascular Disease

## 2018-10-10 ENCOUNTER — Telehealth: Payer: Self-pay | Admitting: *Deleted

## 2018-10-10 ENCOUNTER — Encounter

## 2018-10-10 VITALS — BP 126/68 | HR 89 | Ht 64.0 in | Wt 146.4 lb

## 2018-10-10 DIAGNOSIS — I350 Nonrheumatic aortic (valve) stenosis: Secondary | ICD-10-CM | POA: Diagnosis not present

## 2018-10-10 DIAGNOSIS — I480 Paroxysmal atrial fibrillation: Secondary | ICD-10-CM

## 2018-10-10 DIAGNOSIS — I4891 Unspecified atrial fibrillation: Secondary | ICD-10-CM | POA: Diagnosis not present

## 2018-10-10 DIAGNOSIS — Z953 Presence of xenogenic heart valve: Secondary | ICD-10-CM

## 2018-10-10 DIAGNOSIS — Z5181 Encounter for therapeutic drug level monitoring: Secondary | ICD-10-CM | POA: Diagnosis not present

## 2018-10-10 DIAGNOSIS — I10 Essential (primary) hypertension: Secondary | ICD-10-CM | POA: Diagnosis not present

## 2018-10-10 DIAGNOSIS — E7800 Pure hypercholesterolemia, unspecified: Secondary | ICD-10-CM | POA: Diagnosis not present

## 2018-10-10 LAB — LIPID PANEL
Chol/HDL Ratio: 3.2 ratio (ref 0.0–4.4)
Cholesterol, Total: 282 mg/dL — ABNORMAL HIGH (ref 100–199)
HDL: 87 mg/dL (ref >39)
LDL CALC: 170 mg/dL — AB (ref 0–99)
Triglycerides: 123 mg/dL (ref 0–149)
VLDL Cholesterol Cal: 25 mg/dL (ref 5–40)

## 2018-10-10 LAB — COMPREHENSIVE METABOLIC PANEL
ALBUMIN: 4.8 g/dL — AB (ref 3.5–4.7)
ALT: 25 IU/L (ref 0–32)
AST: 29 IU/L (ref 0–40)
Albumin/Globulin Ratio: 2.2 (ref 1.2–2.2)
Alkaline Phosphatase: 84 IU/L (ref 39–117)
BUN/Creatinine Ratio: 12 (ref 12–28)
BUN: 10 mg/dL (ref 8–27)
Bilirubin Total: 0.7 mg/dL (ref 0.0–1.2)
CO2: 26 mmol/L (ref 20–29)
Calcium: 9.9 mg/dL (ref 8.7–10.3)
Chloride: 98 mmol/L (ref 96–106)
Creatinine, Ser: 0.84 mg/dL (ref 0.57–1.00)
GFR calc Af Amer: 75 mL/min/{1.73_m2} (ref >59)
GFR, EST NON AFRICAN AMERICAN: 65 mL/min/{1.73_m2} (ref >59)
Globulin, Total: 2.2 g/dL (ref 1.5–4.5)
Glucose: 96 mg/dL (ref 65–99)
Potassium: 3.2 mmol/L — ABNORMAL LOW (ref 3.5–5.2)
Sodium: 142 mmol/L (ref 134–144)
Total Protein: 7 g/dL (ref 6.0–8.5)

## 2018-10-10 LAB — POCT INR: INR: 2.5 (ref 2.0–3.0)

## 2018-10-10 MED ORDER — POTASSIUM CHLORIDE ER 10 MEQ PO TBCR: EXTENDED_RELEASE_TABLET | ORAL | 3 refills | Status: DC

## 2018-10-10 NOTE — Telephone Encounter (Signed)
Advised patient of lab results and medication changes, verbalized understanding. Will have labs checked when she returns in 2 weeks

## 2018-10-10 NOTE — Telephone Encounter (Signed)
-----   Message from [name], MD sent at 10/10/2018  5:57 PM EST ----- Cholesterol levels are high as expected.  Let's work on [employer] as planned. Increase potassium to 40 mEq daily.  Check BMP in a couple weeks.

## 2018-10-10 NOTE — Patient Instructions (Signed)
Medication Instructions:  Your physician recommends that you continue on your current medications as directed. Please refer to the Current Medication list given to you today.  If you need a refill on your cardiac medications before your next appointment, please call your pharmacy.   Lab work: LP/CMET TODAY   If you have labs (blood work) drawn today and your tests are completely normal, you will receive your results only by: Marland Kitchen MyChart Message (if you have MyChart) OR . A paper copy in the mail If you have any lab test that is abnormal or we need to change your treatment, we will call you to review the results.  Testing/Procedures: NONE  Follow-Up: At [facility], you and your health needs are our priority.  As part of our continuing mission to provide you with exceptional heart care, we have created designated Provider Care Teams.  These Care Teams include your primary Cardiologist (physician) and Advanced Practice Providers (APPs -  Physician Assistants and Nurse Practitioners) who all work together to provide you with the care you need, when you need it. You will need a follow up appointment in 4 months. You may see [name], MD or one of the following Advanced Practice Providers on your designated Care Team:   Kerin Ransom, PA-C Roby Lofts, [place] . Sande Rives, PA-C  Your physician recommends that you schedule a follow-up appointment in: [place]

## 2018-10-10 NOTE — Progress Notes (Signed)
Cardiology Office Note   Date:  10/10/2018   ID:  Catherine [name], DOB 1935-02-14, MRN [id number]  PCP:  [name], MD  Cardiologist:   [name], MD  Cardiothoracic surgeon: Dr. Roxy Manns  No chief complaint on file.    History of Present Illness: Catherine [name] is a 84 y.o. 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 Catherine [name] was previously a patient of [name]. She underwent mitral commissurotomy in the 1970s. On her echocardiogram 02/2014 she had an ejection fraction of 60-65% with moderate to severe mitral regurgitation. There was also mild aortic regurgitation. Her follow-up echo 02/2015 showed an EF of 60-65% with moderate to severe mitral regurgitation, moderate tricuspid regurgitation and elevated pulmonary artery pressures.  Repeat echo 9/11/7 revealed moderate pulmonary stenosis with moderate to severe mitral regurgitation.  She did not have any pulmonary hypertension and her LV was not dilated.  She was referred for left and right heart catheterization. She was found to have severe mitral stenosis with a 13 mmHg mean gradient, MVA 0.99 cm^2. She also had 50% LCx and 80% RCA lesions.  She had a 29 mm bioprosthetic mitral valve, single vessel CABG and MAZE with Dr. Roxy Manns on 10/14/16.  She was referred for a baseline echo 02/15/17 that revealed LVEF 60-65% with mild aortic stenosis (mean gradient 12 mmHg).  Her bioprosthetic mitral valve was functioning well.    After her surgery Catherine [name] continued to report fatigue.  It was unclear whether she was in atrial fibrillation or sinus rhythm with a long PR interval.  She was referred to the atrial fibrillation clinic where this remained unclear.  However, it was felt that if she were in atrial fibrillation it would not be possible to maintain sinus rhythm.  His last  appointment she developed lower extremity edema. She stopped amlodipine and this improved. This was switched to irbesartan. She then developed diaphoresis and insomnia that she attributed to this medication.  Catherine [name] has been doing better since her last appointment.  She continues to exercise three times per week at the [facility].  She has met several very good friends and her mood has been much better.  For the last week she has been struggling with an upper respiratory infection productive of yellow sputum.  She has some shortness of breath but her Symbicort inhaler helps.  She still has experienced several coughing attacks and has difficulty sleeping at night.  She denies fever or chills.  She checks her blood pressure regularly and it has been ranging from the 130s-150s over 60s-80s.  She is frustrated that she cannot seem to lose weight despite exercising regularly.  She does note that she stopped at [employer] for breakfast after each workout.  Lately she has been taking Lasix once daily and has no lower extremity edema or orthopnea.  Since her last appointment Catherine [name] has been struggling with her anxiety.  She has been working with Dr. Elease Hashimoto to lower her valium use.  She has been taking 20mqhs for many years.  After reducing the dose she has been struggling with panic and feeling "shaky inside."  She gets very short of breath and thought it was her lungs.  She followed up with Dr. WMelvyn Novasand felt better after doing a 6 minute walk.  PFTs were stable.  She was started on sertraline and valium  was temporarily increased.  Since then she has been sleeping and feeling much better.  She denies chest pain and her breathing has been stable.  She has no edema, orthopnea or PND.   Since her last appointment Catherine [name] has been doing well.  She had a skin cancer removed from her anterior tibia.  This was complicated by cellulitis and required 4 rounds of antibiotics.  She also had the flu.  He had a skin cancer  removed occasional heart fluttering the last 2.  It is associated with shortness of breath but chest pain.  2 or 3 times a week, usually when rest of breath especially when it is cloudy or rainy.  Her Symbicort and albuterol seem to help.  She saw our pharmacists to discuss starting [place].  However she wanted to wait because she was in the doughnut hole at the time.    Past Medical History:  Diagnosis Date  . Aortic stenosis, mild 07/26/2017  . Arthritis   . Asthma    last attack 02/2015  . Atrial fibrillation, chronic   . Cellulitis of left lower extremity 08/17/2016   Ulcer associated with severe venous insufficiency  . Chronic anticoagulation   . Chronic diastolic CHF (congestive heart failure) ([place])   . Chronic kidney disease    "RIGHT MANY KIDNEY INFECTIONS AND STONES"  . COPD (chronic obstructive pulmonary disease) ([place])   . Coronary artery disease   . Dizziness   . H/O: rheumatic fever   . Heart murmur   . Hypertension   . PONV (postoperative nausea and vomiting)    ' SOMETIMES', BUT NOT ALWAYS"  . S/P Maze operation for atrial fibrillation 10/14/2016   Complete bilateral atrial lesion set using cryothermy and bipolar radiofrequency ablation - atrial appendage was not treated due to previous surgical procedure (open mitral commissurotomy)  . S/P mitral valve replacement with bioprosthetic valve 10/14/2016   29 mm Medtronic Mosaic porcine bioprosthetic tissue valve  . UTI (urinary tract infection)   . Valvular heart disease    Has mitral stenosis with prior mitral commissurotomy in 1970    Past Surgical History:  Procedure Laterality Date  . ABDOMINAL HYSTERECTOMY  1983   endometriosis  . APPENDECTOMY    . BACK SURGERY     neurosurgery x2  . CARDIAC CATHETERIZATION    . CARDIAC CATHETERIZATION N/A 08/03/2016   Procedure: Right/Left Heart Cath and Coronary Angiography;  Surgeon: Peter M Martinique, MD;  Location: [place] [facility];  Service: Cardiovascular;  Laterality: N/A;   . cataract surg    . CHOLECYSTECTOMY N/A 05/30/2015   Procedure: LAPAROSCOPIC CHOLECYSTECTOMY WITH INTRAOPERATIVE CHOLANGIOGRAM;  Surgeon: Excell Seltzer, MD;  Location: [place];  Service: General;  Laterality: N/A;  . COLONOSCOPY    . CORONARY ARTERY BYPASS GRAFT N/A 10/14/2016   Procedure: CORONARY ARTERY BYPASS GRAFTING (CABG);  Surgeon: Rexene Alberts, MD;  Location: [place];  Service: Open Heart Surgery;  Laterality: N/A;  . EYE SURGERY    . MAZE N/A 10/14/2016   Procedure: MAZE;  Surgeon: Rexene Alberts, MD;  Location: [place];  Service: Open Heart Surgery;  Laterality: N/A;  . MITRAL VALVE REPLACEMENT N/A 10/14/2016   Procedure: REDO MITRAL VALVE REPLACEMENT (MVR);  Surgeon: Rexene Alberts, MD;  Location: [place];  Service: Open Heart Surgery;  Laterality: N/A;  . MITRAL VALVE SURGERY Left 1970   Open mitral commissurotomy via left thoracotomy approach  . TEE WITHOUT CARDIOVERSION N/A 07/05/2016   Procedure: TRANSESOPHAGEAL ECHOCARDIOGRAM (  TEE);  Surgeon: [name], MD;  Location: [place];  Service: Cardiovascular;  Laterality: N/A;  . TEE WITHOUT CARDIOVERSION N/A 10/14/2016   Procedure: TRANSESOPHAGEAL ECHOCARDIOGRAM (TEE);  Surgeon: Rexene Alberts, MD;  Location: [place];  Service: Open Heart Surgery;  Laterality: N/A;    Current Outpatient Medications  Medication Sig Dispense Refill  . acetaminophen (TYLENOL) 500 MG tablet Take 500 mg by mouth every 6 (six) hours as needed for mild pain.    Marland Kitchen albuterol (PROVENTIL HFA;VENTOLIN HFA) 108 (90 Base) MCG/ACT inhaler Inhale 2 puffs into the lungs every 6 (six) hours as needed for wheezing or shortness of breath. 1 Inhaler 0  . aspirin EC 81 MG EC tablet Take 1 tablet (81 mg total) by mouth daily.    . budesonide-formoterol (SYMBICORT) 80-4.5 MCG/ACT inhaler 2 PUFFS 2 TIMES A DAY 10.2 g 5  . diazepam (VALIUM) 5 MG tablet TAKE ONE TABLET AT BEDTIME 30 tablet 5  . doxazosin (CARDURA) 4 MG tablet Take 1 tablet (4 mg total) by mouth  daily. 90 tablet 1  . furosemide (LASIX) 40 MG tablet Take 1 tablet by mouth daily. Ok to take an extra tablet as needed for swelling. 60 tablet 3  . OVER THE COUNTER MEDICATION Apply 1 application topically at bedtime as needed (Leg Pain). Magni-Life topical cream for leg pain.    . potassium chloride (K-DUR) 10 MEQ tablet Take 1 tablet 2 (two) times daily. Take an extra 2 tablets on days you take extra Furosemide 120 tablet 0  . triamterene-hydrochlorothiazide (MAXZIDE-25) 37.5-25 MG tablet Take 1 tablet by mouth daily. 90 tablet 3  . warfarin (COUMADIN) 2 MG tablet Take 1 tablet daily except 2 on Mondays or AS DIRECTED BY ANTICOAGULATION CLINIC  90 DAY 105 tablet 1   No current facility-administered medications for this visit.     Allergies:   Aldactone [spironolactone]; Amoxicillin; Diltiazem; Flagyl [metronidazole hcl]; Flovent [fluticasone propionate]; Gabapentin; Lyrica [pregabalin]; Quinidine; Simvastatin; Tramadol; Verapamil; Amlodipine; Zetia [ezetimibe]; Ace inhibitors; Benazepril hcl; Ciprocin-fluocin-procin [fluocinolone acetonide]; Ciprofloxacin; Codeine; and Nitrofurantoin monohyd macro    Social History:  The patient  reports that she quit smoking about 43 years ago. Her smoking use included cigarettes. She has a 15.00 pack-year smoking history. She has never used smokeless tobacco. She reports that she does not drink alcohol or use drugs.   Family History:  The patient's family history includes Leukemia in her father.    ROS:  Please see the history of present illness.   Otherwise, review of systems are positive for chest wall soreness.   All other systems are reviewed and negative.    PHYSICAL EXAM: VS:  BP 126/68   Pulse 89   Ht _0  (1.626 m)   Wt 146 lb 6.4 oz (66.4 kg)   BMI 25.13 kg/m  , BMI Body mass index is 25.13 kg/m. GENERAL:  Well appearing HEENT: Pupils equal round and reactive, fundi not visualized, oral mucosa unremarkable NECK:  No jugular venous  distention, waveform within normal limits, carotid upstroke brisk and symmetric, no bruits, no thyromegaly LYMPHATICS:  No cervical adenopathy LUNGS:  Clear to auscultation bilaterally HEART:  RRR.  PMI not displaced or sustained,S1 and S2 within normal limits, no S3, no S4, no clicks, no rubs, II/VI systolic murmur at the LUSB ABD:  Flat, positive bowel sounds normal in frequency in pitch, no bruits, no rebound, no guarding, no midline pulsatile mass, no hepatomegaly, no splenomegaly EXT:  2 plus pulses throughout, no edema, no  cyanosis no clubbing SKIN:  No rashes no nodules NEURO:  Cranial nerves II through XII grossly intact, motor grossly intact throughout PSYCH:  Cognitively intact, oriented to person place and time   EKG:  EKG is ordered today. The ekg ordered 05/21/16 demonstrates atrial fibrillateion.  Prior septal infarct.  Rate 60 bpm.  Unchanged from 10/22/15. 10/27/16: Sinus rhythm.  Rate 60 bpm.  First degree heart block. 02/01/17: Sinus bradycardia with sinus arrhythmia and first degree block.   03/08/17: Sinus bradycardia with sinus arrhythmia.   First degree heart block.  Junctional escape.  QTc 497 ms.   04/26/17: Atrial fibrillation.  Rate 69 bpm. 01/05/18: Accelerated junctional rhythm.  Rate 68 bpm.  Incomplete RBBB.  10/10/18: Sinus rhythm.  Rate 89 bpm.  Nonspecific ST changes.   Echo 07/06/18: Study Conclusions  - Left ventricle: The cavity size was normal. Systolic function was   vigorous. The estimated ejection fraction was in the range of 65%   to 70%. Wall motion was normal; there were no regional wall   motion abnormalities. Doppler parameters are consistent with high   ventricular filling pressure. - Aortic valve: Trileaflet; mildly thickened, mildly calcified   leaflets. There was mild stenosis. Peak velocity (S): 266 cm/s.   Mean gradient (S): 16 mm Hg. - Mitral valve: A bioprosthesis was present and functioning   normally. - Left atrium: The atrium was  severely dilated. Anterior-posterior   dimension: 60 mm. - Tricuspid valve: There was mild regurgitation.  LHC/RHC 08/03/16: The left ventricular systolic function is normal.  LV end diastolic pressure is normal.  The left ventricular ejection fraction is 55-65% by visual estimate.  There is no aortic valve stenosis.  There is severe mitral valve stenosis.  Prox Cx to Mid Cx lesion, 50 %stenosed.  Mid RCA-2 lesion, 80 %stenosed.  Mid RCA-1 lesion, 80 %stenosed.  Hemodynamic findings consistent with mild pulmonary hypertension.  LV end diastolic pressure is normal.   1. Coronary artery disease   - 50% mid LCx   - 80% sequential lesions in the mid RCA 2. Normal LV function 3. Severe mitral stenosis. MV gradient of 13 mm Hg. MVA 0.99 cm squared with index 0.57. 4. Moderate to severe mitral insufficiency 5. Mild pulmonary HTN. 6. Normal LV EDP 7. No significant AV gradient 8. Occluded right brachial artery.  Carotid Dopplers 10/12/16: 1-39% bilateral ICA stenosis  7 Day Event Monitor 06/28/17:  Quality: Fair.  Baseline artifact. Predominant rhythm: Sinus rhythm with PACs and sinus arrhythmia.    Cannot  rule out episodes of atrial fibrillation.  Recent Labs: 12/13/2017: Hemoglobin 14.4; Platelets 270.0 05/23/2018: ALT 29; TSH 3.670 06/29/2018: BUN 14; Creatinine, Ser 0.87; NT-Pro BNP 590; Potassium 3.2; Sodium 146    Lipid Panel    Component Value Date/Time   CHOL 224 (H) 05/23/2018 0939   TRIG 134 05/23/2018 0939   HDL 61 05/23/2018 0939   CHOLHDL 3.7 05/23/2018 0939   CHOLHDL 3 12/20/2016 1112   VLDL 36.8 12/20/2016 1112   LDLCALC 136 (H) 05/23/2018 0939   LDLDIRECT 134.7 08/28/2014 1009      Wt Readings from Last 3 Encounters:  10/10/18 146 lb 6.4 oz (66.4 kg)  08/31/18 150 lb 14.4 oz (68.4 kg)  05/31/18 153 lb 3.2 oz (69.5 kg)      ASSESSMENT AND PLAN:  # Rheumatic mitral valve disease: # s/p bioprosthetic mitral valve: Catherine [name] is doing  well.  She has no evidence of heart failure on exam.  Her volume  status is well-controlled.  Continue furosemide.  She needs antibiotics prior to dental procedures.  # Mild aortic stenosis: Mean gradient 16 mmHg on 06/2018.  # Persistent atrial fibrillation:  # Bradycardia: # Palpitations: Rate well-controlled.  Continue warfarin.   # Hypertension:  BLood pressure is well-controlled on HCTZ-triamterene and doxazosin.  # Nonobstructive coronary disease: # Hyperlipidemia: Has not tolerated statins and she now had muscle cramps on Zetia.  She is willing to consider Repatha but had cost concerns.  We will have her see our pharmacist again to discuss options.    Current medicines are reviewed at length with the patient today.  The patient does not have concerns regarding medicines.  The following changes have been made:  none  Labs/ tests ordered today include:   Orders Placed This Encounter  Procedures  . Lipid panel  . Comprehensive metabolic panel  . EKG 12-Lead    Disposition:   FU with Galadriel Shroff C. Oval Linsey, MD, [facility] in 4 months.    Signed, Roselee Tayloe C. Oval Linsey, MD, [facility]  10/10/2018 12:31 PM    [place]

## 2018-10-10 NOTE — Patient Instructions (Addendum)
Pre visit review using our clinic review tool, if applicable. No additional management support is needed unless otherwise documented below in the visit note.  Continue to take 1 tablet every day except 2 tablets on Mondays.  Re-check in 4 weeks at [facility].

## 2018-10-24 ENCOUNTER — Ambulatory Visit: Payer: Medicare Other

## 2018-10-30 ENCOUNTER — Ambulatory Visit (INDEPENDENT_AMBULATORY_CARE_PROVIDER_SITE_OTHER): Payer: Medicare Other | Admitting: Family Medicine

## 2018-10-30 ENCOUNTER — Encounter: Payer: Self-pay | Admitting: Family Medicine

## 2018-10-30 ENCOUNTER — Ambulatory Visit (INDEPENDENT_AMBULATORY_CARE_PROVIDER_SITE_OTHER): Payer: Medicare Other

## 2018-10-30 ENCOUNTER — Other Ambulatory Visit: Payer: Self-pay | Admitting: Cardiovascular Disease

## 2018-10-30 VITALS — BP 100/50 | HR 78 | Temp 99.7°F | Resp 16 | Ht 64.0 in

## 2018-10-30 DIAGNOSIS — J441 Chronic obstructive pulmonary disease with (acute) exacerbation: Secondary | ICD-10-CM | POA: Diagnosis not present

## 2018-10-30 DIAGNOSIS — J069 Acute upper respiratory infection, unspecified: Secondary | ICD-10-CM | POA: Diagnosis not present

## 2018-10-30 DIAGNOSIS — R05 Cough: Secondary | ICD-10-CM | POA: Diagnosis not present

## 2018-10-30 DIAGNOSIS — R059 Cough, unspecified: Secondary | ICD-10-CM

## 2018-10-30 LAB — POC INFLUENZA A&B (BINAX/QUICKVUE)
Influenza A, POC: NEGATIVE
Influenza B, POC: NEGATIVE

## 2018-10-30 MED ORDER — DOXYCYCLINE HYCLATE 100 MG PO TABS: 100.0000 mg | ORAL_TABLET | Freq: Two times a day (BID) | ORAL | 0 refills | Status: AC

## 2018-10-30 MED ORDER — HYDROCODONE-HOMATROPINE 5-1.5 MG/5ML PO SYRP: 5.0000 mL | ORAL_SOLUTION | Freq: Two times a day (BID) | ORAL | 0 refills | Status: AC | PRN

## 2018-10-30 MED ORDER — BENZONATATE 100 MG PO CAPS: 200.0000 mg | ORAL_CAPSULE | Freq: Two times a day (BID) | ORAL | 0 refills | Status: AC | PRN

## 2018-10-30 MED ORDER — IPRATROPIUM-ALBUTEROL 0.5-2.5 (3) MG/3ML IN SOLN
3.0000 mL | Freq: Once | RESPIRATORY_TRACT | Status: AC
  Administered 2018-10-30: 3 mL via RESPIRATORY_TRACT

## 2018-10-30 NOTE — Progress Notes (Signed)
ACUTE VISIT  HPI:  Chief Complaint  Patient presents with  . Cough    with yellow sputum x3 days  . Nasal Congestion  . Anorexia  . Shortness of Breath  . Night Sweats    Catherine [name] is a 84 y.o.female with history of atrial fibrillation, rheumatic mitral stenosis, and COPD here today with her husband complaining of 2-3 days of respiratory symptoms. Sudden onset of feeling fatigue, soreness all over, and night sweats. Productive cough, denies hemoptysis.  Wheezing and dyspnea, aggravated by exertion. COPD, she follows with pulmonologist. She is currently on Symbicort 80-4.5 mcg twice daily and albuterol inhaler as needed. This time she used albuterol inhaler was last night.  Cough is worse at night when lying in bed, interfering with sleep.  She denies orthopnea or PND. She was sick with the "flu" in 08/2018.   Cough  This is a new problem. The current episode started in the past 7 days. The problem has been gradually improving. The cough is productive of sputum. Associated symptoms include chills, myalgias, nasal congestion, postnasal drip, rhinorrhea, shortness of breath, sweats and wheezing. Pertinent negatives include no chest pain, ear congestion, ear pain, eye redness, fever, headaches, heartburn, hemoptysis, rash or sore throat. The symptoms are aggravated by lying down and exercise. Risk factors for lung disease include smoking/tobacco exposure. She has tried a beta-agonist inhaler, OTC cough suppressant and prescription cough suppressant for the symptoms. The treatment provided mild relief. Her past medical history is significant for asthma, COPD and environmental allergies.  Shortness of Breath  Associated symptoms include rhinorrhea and wheezing. Pertinent negatives include no abdominal pain, chest pain, ear pain, fever, headaches, hemoptysis, leg swelling, neck pain, rash, sore throat or vomiting. Her past medical history is significant for asthma and  COPD.    No Hx of recent travel. No sick contact. No known insect bite.  Hx of allergies: Also history of asthma. Negative for history of tobacco use.  OTC medications for this problem: Robitussin and Mucinex. She also has hydrocodone syrup, left from all prescription, already expired; She would like to have a refill.    Review of Systems  Constitutional: Positive for activity change, appetite change, chills and fatigue. Negative for fever.  HENT: Positive for congestion, postnasal drip and rhinorrhea. Negative for ear pain, mouth sores, sinus pressure, sore throat, trouble swallowing and voice change.   Eyes: Negative for discharge and redness.  Respiratory: Positive for cough, shortness of breath and wheezing. Negative for hemoptysis.   Cardiovascular: Negative for chest pain and leg swelling.  Gastrointestinal: Negative for abdominal pain, diarrhea, heartburn, nausea and vomiting.  Musculoskeletal: Positive for myalgias. Negative for joint swelling and neck pain.  Skin: Negative for rash.  Allergic/Immunologic: Positive for environmental allergies.  Neurological: Negative for weakness and headaches.  Hematological: Negative for adenopathy. Does not bruise/bleed easily.      Current Outpatient Medications on File Prior to Visit  Medication Sig Dispense Refill  . acetaminophen (TYLENOL) 500 MG tablet Take 500 mg by mouth every 6 (six) hours as needed for mild pain.    Marland Kitchen albuterol (PROVENTIL HFA;VENTOLIN HFA) 108 (90 Base) MCG/ACT inhaler Inhale 2 puffs into the lungs every 6 (six) hours as needed for wheezing or shortness of breath. 1 Inhaler 0  . aspirin EC 81 MG EC tablet Take 1 tablet (81 mg total) by mouth daily.    . budesonide-formoterol (SYMBICORT) 80-4.5 MCG/ACT inhaler 2 PUFFS 2 TIMES A DAY 10.2 g  5  . diazepam (VALIUM) 5 MG tablet TAKE ONE TABLET AT BEDTIME 30 tablet 5  . OVER THE COUNTER MEDICATION Apply 1 application topically at bedtime as needed (Leg Pain).  Magni-Life topical cream for leg pain.    . potassium chloride (K-DUR) 10 MEQ tablet Take 2 tablets by mouth twice a day (Patient taking differently: Take 4 tablets by mouth twice a day) 360 tablet 3  . triamterene-hydrochlorothiazide (MAXZIDE-25) 37.5-25 MG tablet Take 1 tablet by mouth daily. 90 tablet 3  . warfarin (COUMADIN) 2 MG tablet Take 1 tablet daily except 2 on Mondays or AS DIRECTED BY ANTICOAGULATION CLINIC  90 DAY 105 tablet 1   No current facility-administered medications on file prior to visit.      Past Medical History:  Diagnosis Date  . Aortic stenosis, mild 07/26/2017  . Arthritis   . Asthma    last attack 02/2015  . Atrial fibrillation, chronic   . Cellulitis of left lower extremity 08/17/2016   Ulcer associated with severe venous insufficiency  . Chronic anticoagulation   . Chronic diastolic CHF (congestive heart failure) ([place])   . Chronic kidney disease    "RIGHT MANY KIDNEY INFECTIONS AND STONES"  . COPD (chronic obstructive pulmonary disease) ([place])   . Coronary artery disease   . Dizziness   . H/O: rheumatic fever   . Heart murmur   . Hypertension   . PONV (postoperative nausea and vomiting)    ' SOMETIMES', BUT NOT ALWAYS"  . S/P Maze operation for atrial fibrillation 10/14/2016   Complete bilateral atrial lesion set using cryothermy and bipolar radiofrequency ablation - atrial appendage was not treated due to previous surgical procedure (open mitral commissurotomy)  . S/P mitral valve replacement with bioprosthetic valve 10/14/2016   29 mm Medtronic Mosaic porcine bioprosthetic tissue valve  . UTI (urinary tract infection)   . Valvular heart disease    Has mitral stenosis with prior mitral commissurotomy in 1970   Allergies  Allergen Reactions  . Aldactone [Spironolactone] Other (See Comments)    dyspnea  . Amoxicillin Palpitations    Tachycardia Has patient had a PCN reaction causing immediate rash, facial/tongue/throat swelling, SOB or  lightheadedness with hypotension: no Has patient had a PCN reaction causing severe rash involving mucus membranes or skin necrosis: {no Has patient had a PCN reaction that required hospitalization {no Has patient had a PCN reaction occurring within the last 10 years: {yes If all of the above answers are "NO", then may proceed with Cephalosporin use.  . Diltiazem Other (See Comments)    Causing headaches   . Flagyl [Metronidazole Hcl] Other (See Comments)    Causing headaches    . Flovent [Fluticasone Propionate] Other (See Comments)    Leg cramps  . Gabapentin Swelling  . Lyrica [Pregabalin] Swelling  . Quinidine Diarrhea and Other (See Comments)    Fever diarrhea  . Simvastatin Other (See Comments)    Leg pain, myalgia  . Tramadol Nausea Only  . Verapamil Other (See Comments)    myalgias  . Amlodipine     Low extremity edema  . Zetia [Ezetimibe]     LEG CRAMPS   . Ace Inhibitors Other (See Comments)    unknown  . Benazepril Hcl Cough  . Ciprocin-Fluocin-Procin [Fluocinolone Acetonide] Other (See Comments)    unknown  . Ciprofloxacin Diarrhea  . Codeine Nausea Only  . Nitrofurantoin Monohyd Macro Nausea Only    Social History   Socioeconomic History  . Marital status: Married  Spouse name: Not on file  . Number of children: Not on file  . Years of education: Not on file  . Highest education level: Not on file  Occupational History  . Not on file  Social Needs  . Financial resource strain: Not on file  . Food insecurity:    Worry: Not on file    Inability: Not on file  . Transportation needs:    Medical: Not on file    Non-medical: Not on file  Tobacco Use  . Smoking status: Former Smoker    Packs/day: 1.00    Years: 15.00    Pack years: 15.00    Types: Cigarettes    Last attempt to quit: 09/28/1975    Years since quitting: 43.1  . Smokeless tobacco: Never Used  Substance and Sexual Activity  . Alcohol use: No    Alcohol/week: 0.0 standard drinks  .  Drug use: No  . Sexual activity: Yes  Lifestyle  . Physical activity:    Days per week: Not on file    Minutes per session: Not on file  . Stress: Not on file  Relationships  . Social connections:    Talks on phone: Not on file    Gets together: Not on file    Attends religious service: Not on file    Active member of club or organization: Not on file    Attends meetings of clubs or organizations: Not on file    Relationship status: Not on file  Other Topics Concern  . Not on file  Social History Narrative  . Not on file    Vitals:   10/30/18 1637  BP: (!) 100/50  Pulse: 78  Resp: 16  Temp: 99.7 F (37.6 C)  SpO2: 94%   Body mass index is 25.13 kg/m.   Physical Exam  Nursing note and vitals reviewed. Constitutional: She is oriented to person, place, and time. She appears well-developed and well-nourished. She does not appear ill. No distress.  HENT:  Head: Normocephalic and atraumatic.  Nose: Rhinorrhea present. Right sinus exhibits no maxillary sinus tenderness and no frontal sinus tenderness. Left sinus exhibits no maxillary sinus tenderness and no frontal sinus tenderness.  Mouth/Throat: Oropharynx is clear and moist and mucous membranes are normal.  Eyes: Conjunctivae are normal.  Neck: No edema and no erythema present.  Cardiovascular: Normal rate and regular rhythm.  Murmur (SEM I-II/VI RUSB>LUSB) heard. Respiratory: Effort normal. No stridor. No respiratory distress. She has decreased breath sounds. She has wheezes.  Lymphadenopathy:    She has no cervical adenopathy.  Neurological: She is alert and oriented to person, place, and time. She has normal strength. Gait normal.  Skin: Skin is warm. No rash noted. No erythema.  Psychiatric: She has a normal mood and affect.  Well groomed, good eye contact.      ASSESSMENT AND PLAN:   Ms. Krystel was seen today for cough, nasal congestion, anorexia, shortness of breath and night sweats.  Diagnoses and all  orders for this visit:   URI, acute Rapid flu test here in the office negative. History suggest a viral etiology, so for now I am recommending symptomatic treatment. Plain Mucinex may help. Adequate hydration and rest.  -     POC Influenza A&B(BINAX/QUICKVUE)  COPD exacerbation (HCC) Wheezing and ventilation improved after DuoNeb treatment, which she tolerated well. She has prednisone 20 mg at home, recommend taking 1 tablet daily with breakfast for 4 days. Albuterol inh 1 puff every 6 hours for  a week then as needed for wheezing or shortness of breath.  We discussed side effects of prednisone and albuterol.  I do not think antibiotic treatment is needed at this time, recommend starting antibiotic (doxycycline) if she has not feeling any better in 3 to 4 days.  Further recommendation will be given according to CXR results.  Instructed about warning signs. Follow-up with PCP in 10 days, before if needed.  -     doxycycline (VIBRA-TABS) 100 MG tablet; Take 1 tablet (100 mg total) by mouth 2 (two) times daily for 7 days. -     ipratropium-albuterol (DUONEB) 0.5-2.5 (3) MG/3ML nebulizer solution 3 mL -     DG Chest 2 View; Future -     DG Chest 2 View   Cough Lung auscultation after DuoNeb treatment negative for rales, wheezing, or rhonchi. Explained that cough can last a few days and even weeks after respiratory infections. Adequate hydration. Further recommendation will be given according to imaging results.  -     benzonatate (TESSALON) 100 MG capsule; Take 2 capsules (200 mg total) by mouth 2 (two) times daily as needed for up to 10 days. -     HYDROcodone-homatropine (HYCODAN) 5-1.5 MG/5ML syrup; Take 5 mLs by mouth every 12 (twelve) hours as needed for up to 10 days for cough. -     ipratropium-albuterol (DUONEB) 0.5-2.5 (3) MG/3ML nebulizer solution 3 mL   Recommend arrange an appointment with Coumadin clinic to check INR on 11/02/2018.    Return in about 10 days (around  11/09/2018) for PCP.   Jayvier Burgher G. Martinique, MD  [facility]. [place] office.

## 2018-10-30 NOTE — Telephone Encounter (Signed)
Rx request sent to pharmacy.  

## 2018-10-30 NOTE — Patient Instructions (Addendum)
A few things to remember from today's visit:   URI, acute  COPD exacerbation ([place]) - Plan: doxycycline (VIBRA-TABS) 100 MG tablet  Cough Albuterol inh 1 puff every 6 hours for a week then as needed for wheezing or shortness of breath.   Take prednisone 20 mg with breakfast daily for 4 days. Please arrange Coumadin clinic appointment to check INR in 3 days.  Please be sure medication list is accurate. If a new problem present, please set up appointment sooner than planned today.

## 2018-10-31 ENCOUNTER — Ambulatory Visit: Payer: Medicare Other | Admitting: Family Medicine

## 2018-11-01 ENCOUNTER — Ambulatory Visit: Payer: Self-pay

## 2018-11-01 DIAGNOSIS — J159 Unspecified bacterial pneumonia: Secondary | ICD-10-CM | POA: Diagnosis not present

## 2018-11-01 DIAGNOSIS — R05 Cough: Secondary | ICD-10-CM | POA: Diagnosis not present

## 2018-11-01 DIAGNOSIS — J441 Chronic obstructive pulmonary disease with (acute) exacerbation: Secondary | ICD-10-CM | POA: Diagnosis not present

## 2018-11-01 NOTE — Telephone Encounter (Signed)
Incoming call from Patients wife who is on the  DPR,  Stating that his wife has been  Up  All night coughing, Gasping from air,  Stated that  Patient was in the office on Monday , received a breathing treatment, and a xray.  Patient is inquiring about result of xray.  Patient states she is unable to bring up phlegm last night.  Patient states that as she walks around she has to stop and go , has to stop to catch her breath.  Has been taking the mucinex as instructed and the hydrocodone cough medicine.  During the triage, Patient was able to bring up sputum  states that it is yellowish brown in color. Per protocol Recommended that Patient be evaluated at ED/Urgent Care  Patient  Husband state that he will be taking Patient to [facility].    Reason for Disposition . Wheezing can be heard across the room  Answer Assessment - Initial Assessment Questions 1. RESPIRATORY STATUS: "Describe your breathing?" (e.g., wheezing, shortness of breath, unable to speak, severe coughing)      SOB 2. ONSET: "When did this breathing problem begin?"       Last Friday 3. PATTERN "Does the difficult breathing come and go, or has it been constant since it started?"      come 4. SEVERITY: "How bad is your breathing?" (e.g., mild, moderate, severe)    - MILD: No SOB at rest, mild SOB with walking, speaks normally in sentences, can lay down, no retractions, pulse < 100.    - MODERATE: SOB at rest, SOB with minimal exertion and prefers to sit, cannot lie down flat, speaks in phrases, mild retractions, audible wheezing, pulse 100-120.    - SEVERE: Very SOB at rest, speaks in single words, struggling to breathe, sitting hunched forward, retractions, pulse > 120      Patient thinks it severe 5. RECURRENT SYMPTOM: "Have you had difficulty breathing before?" If so, ask: "When was the last time?" and "What happened that time?"     Yes not this bad. 6. CARDIAC HISTORY: "Do you have any history of heart disease?"  (e.g., heart attack, angina, bypass surgery, angioplasty)       Yes   Heart surgery twice 7. LUNG HISTORY: "Do you have any history of lung disease?"  (e.g., pulmonary embolus, asthma, emphysema)      Copd , asthma 8. CAUSE: "What do you think is causing the breathing problem?"      *No Answer* 9. OTHER SYMPTOMS: "Do you have any other symptoms? (e.g., dizziness, runny nose, cough, chest pain, fever)     Cough, denies fever 10. PREGNANCY: "Is there any chance you are pregnant?" "When was your last menstrual period?"       Na  11. TRAVEL: "Have you traveled out of the country in the last month?" (e.g., travel history, exposures)  Protocols used: BREATHING DIFFICULTY-A-AH

## 2018-11-01 NOTE — Telephone Encounter (Signed)
Agree with advice for urgent evaluation

## 2018-11-03 ENCOUNTER — Telehealth: Payer: Self-pay | Admitting: Internal Medicine

## 2018-11-03 NOTE — Telephone Encounter (Signed)
Called and spoke with Patient.  She was requesting Symbicort  And proair samples.  Explained we do not have symbicort at this time and we do not get proair samples.  She did not want a prescription sent to pharmacy at this time.  She stated she would call back next week.  Nothing further at this time.

## 2018-11-06 ENCOUNTER — Ambulatory Visit (INDEPENDENT_AMBULATORY_CARE_PROVIDER_SITE_OTHER): Payer: Medicare Other | Admitting: General Practice

## 2018-11-06 ENCOUNTER — Encounter: Payer: Self-pay | Admitting: Family Medicine

## 2018-11-06 ENCOUNTER — Other Ambulatory Visit: Payer: Self-pay

## 2018-11-06 ENCOUNTER — Ambulatory Visit (INDEPENDENT_AMBULATORY_CARE_PROVIDER_SITE_OTHER): Payer: Medicare Other | Admitting: Family Medicine

## 2018-11-06 VITALS — BP 120/70 | HR 76 | Temp 98.6°F | Ht 64.0 in | Wt 137.4 lb

## 2018-11-06 DIAGNOSIS — R05 Cough: Secondary | ICD-10-CM | POA: Diagnosis not present

## 2018-11-06 DIAGNOSIS — R062 Wheezing: Secondary | ICD-10-CM | POA: Diagnosis not present

## 2018-11-06 DIAGNOSIS — R0600 Dyspnea, unspecified: Secondary | ICD-10-CM | POA: Diagnosis not present

## 2018-11-06 DIAGNOSIS — I4891 Unspecified atrial fibrillation: Secondary | ICD-10-CM | POA: Diagnosis not present

## 2018-11-06 DIAGNOSIS — R059 Cough, unspecified: Secondary | ICD-10-CM

## 2018-11-06 LAB — POCT INR: INR: 3.1 — AB (ref 2.0–3.0)

## 2018-11-06 MED ORDER — ALBUTEROL SULFATE HFA 108 (90 BASE) MCG/ACT IN AERS: 2.0000 | INHALATION_SPRAY | Freq: Four times a day (QID) | RESPIRATORY_TRACT | 0 refills | Status: DC | PRN

## 2018-11-06 MED ORDER — METHYLPREDNISOLONE ACETATE 80 MG/ML IJ SUSP
80.0000 mg | Freq: Once | INTRAMUSCULAR | Status: AC
  Administered 2018-11-06: 80 mg via INTRAMUSCULAR
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
## 2018-11-06 NOTE — Patient Instructions (Addendum)
Pre visit review using our clinic review tool, if applicable. No additional management support is needed unless otherwise documented below in the visit note.  Hold coumadin today and then continue to take 1 tablet every day except 2 tablets on Mondays.

## 2018-11-06 NOTE — Patient Instructions (Signed)
Follow up for any fever or increased shortness of breath. 

## 2018-11-07 ENCOUNTER — Other Ambulatory Visit: Payer: Self-pay

## 2018-11-07 ENCOUNTER — Ambulatory Visit: Payer: Medicare Other

## 2018-11-07 NOTE — Telephone Encounter (Signed)
[place] and spoke with pharmacist and gave him the verbal OK per Dr. Elease Hashimoto to refill Valium 5mg  just this one time.

## 2018-11-14 ENCOUNTER — Other Ambulatory Visit: Payer: Self-pay | Admitting: Cardiovascular Disease

## 2018-11-15 NOTE — Telephone Encounter (Signed)
Rx(s) sent to pharmacy electronically.  

## 2018-11-27 ENCOUNTER — Ambulatory Visit: Payer: Medicare Other | Admitting: General Practice

## 2018-11-27 DIAGNOSIS — I4891 Unspecified atrial fibrillation: Secondary | ICD-10-CM

## 2018-11-27 LAB — POCT INR: INR: 1.5 — AB (ref 2.0–3.0)

## 2018-11-27 NOTE — Patient Instructions (Signed)
Pre visit review using our clinic review tool, if applicable. No additional management support is needed unless otherwise documented below in the visit note.  Take 3 tablets today (3/2) and take 2 tablets tomorrow (3/3) and then continue to take 1 tablet every day except 2 tablets on Mondays.  Re-check in 2 weeks.

## 2018-12-04 ENCOUNTER — Other Ambulatory Visit: Payer: Self-pay | Admitting: Family Medicine

## 2018-12-04 NOTE — Telephone Encounter (Signed)
Refill for 6 months. 

## 2018-12-04 NOTE — Telephone Encounter (Signed)
Last Rx given on 9/23 #30 with 5 ref

## 2018-12-04 NOTE — Telephone Encounter (Signed)
Rx done. 

## 2018-12-11 ENCOUNTER — Ambulatory Visit: Payer: Medicare Other

## 2018-12-12 ENCOUNTER — Telehealth: Payer: Self-pay | Admitting: General Practice

## 2018-12-12 ENCOUNTER — Other Ambulatory Visit (INDEPENDENT_AMBULATORY_CARE_PROVIDER_SITE_OTHER): Payer: Medicare Other

## 2018-12-12 ENCOUNTER — Other Ambulatory Visit: Payer: Self-pay

## 2018-12-12 ENCOUNTER — Ambulatory Visit (INDEPENDENT_AMBULATORY_CARE_PROVIDER_SITE_OTHER): Payer: Medicare Other | Admitting: General Practice

## 2018-12-12 DIAGNOSIS — I4891 Unspecified atrial fibrillation: Secondary | ICD-10-CM | POA: Diagnosis not present

## 2018-12-12 LAB — POCT INR: INR: 1.5 — AB (ref 2.0–3.0)

## 2018-12-12 NOTE — Telephone Encounter (Signed)
Left vm on both cell and home numbers to call Villa Herb, RN @ [phone].

## 2018-12-12 NOTE — Patient Instructions (Addendum)
Pre visit review using our clinic review tool, if applicable. No additional management support is needed unless otherwise documented below in the visit note.  Take 1 1/2 tablets today (3/17) and tomorrow (3/18)  and then change dosage and take 1 tablet every day except 2 tablets on Mondays and Fridays.  Re-check in 2 weeks.

## 2018-12-23 ENCOUNTER — Other Ambulatory Visit: Payer: Self-pay | Admitting: Cardiovascular Disease

## 2018-12-25 ENCOUNTER — Ambulatory Visit (INDEPENDENT_AMBULATORY_CARE_PROVIDER_SITE_OTHER): Payer: Medicare Other | Admitting: General Practice

## 2018-12-25 ENCOUNTER — Ambulatory Visit: Payer: Medicare Other | Admitting: Family Medicine

## 2018-12-25 ENCOUNTER — Other Ambulatory Visit: Payer: Self-pay

## 2018-12-25 DIAGNOSIS — I4891 Unspecified atrial fibrillation: Secondary | ICD-10-CM | POA: Diagnosis not present

## 2018-12-25 LAB — POCT INR: INR: 2.8 (ref 2.0–3.0)

## 2018-12-25 NOTE — Patient Instructions (Incomplete)
Pre visit review using our clinic review tool, if applicable. No additional management support is needed unless otherwise documented below in the visit note.  Continue to take 1 tablet every day except 2 tablets on Mondays and Fridays.  Re-check in 4 weeks.  

## 2019-01-01 ENCOUNTER — Other Ambulatory Visit: Payer: Self-pay

## 2019-01-01 ENCOUNTER — Telehealth: Payer: Self-pay | Admitting: Family Medicine

## 2019-01-01 ENCOUNTER — Ambulatory Visit (INDEPENDENT_AMBULATORY_CARE_PROVIDER_SITE_OTHER): Payer: Medicare Other | Admitting: Family Medicine

## 2019-01-01 DIAGNOSIS — S81811A Laceration without foreign body, right lower leg, initial encounter: Secondary | ICD-10-CM | POA: Diagnosis not present

## 2019-01-01 NOTE — Telephone Encounter (Signed)
Spoke with patient, states that she was walking around her bed this weekend (Friday 4/3) and hit her left leg on the steps at the end of her bed. Pt states that she ripped the skin on her leg, about a 1in laceration. She has been doctoring on it but the skin looks as though it may need to stitched. Pt having trouble getting the bleeding under control since cutting it - pt is on Coumadin. Pt is requesting that she drive up and Dr Elease Hashimoto take a look at her outside - pt is very hesitant on going to UC or ED given her age and everything going on right now.   Pt states that her leg is hurting today quite a bit.

## 2019-01-01 NOTE — Patient Instructions (Signed)
Clean wound gently daily with soap and water and let air dry  Keep covered with non-stick dressing  Keep small amount of polysporin or vaseline to prevent drying  Follow up for increased redness, warmth, or other concerns

## 2019-01-01 NOTE — Telephone Encounter (Signed)
Copied from [place]. Topic: Quick Communication - See Telephone Encounter >> Jan 01, 2019  8:55 AM Robina Ade, Helene Kelp D wrote: CRM for notification. See Telephone encounter for: 01/01/19. Patient call and would like a call back from [facility] Dr. Elease Hashimoto CMA. Patient said that she hurt herself on her bed steps on Friday and hurt her left leg and its really bad. She is not sure if she needs to come in or go to the ED. I told patient sounded like she needed to be seen but patient would like to talk to her berfore stepping out of the house.

## 2019-01-01 NOTE — Progress Notes (Signed)
Subjective:     Patient ID: Catherine [name], female   DOB: [birth year], 84 y.o.   MRN: [id number]  HPI Patient called earlier today with complaint that she had "gashed "her right leg.  This occurred last Friday night.  She is on Coumadin had significant bout of bleeding which was eventually controlled with pressure.  Injury occurred as she fell against the edge of a step.  There was no loss of consciousness.  No other injuries reported.  She had some oozing of blood over the weekend when changing dressings.  Last tetanus 2013.  Patient had requested that we look at the wound today and we had her drive up to our outside parking lot to further evaluate.  She is ambulating without difficulty.  Pro times have been stable.  She has been applying some over-the-counter Polysporin and Neosporin.  Past Medical History:  Diagnosis Date  . Aortic stenosis, mild 07/26/2017  . Arthritis   . Asthma    last attack 02/2015  . Atrial fibrillation, chronic   . Cellulitis of left lower extremity 08/17/2016   Ulcer associated with severe venous insufficiency  . Chronic anticoagulation   . Chronic diastolic CHF (congestive heart failure) ([place])   . Chronic kidney disease    "RIGHT MANY KIDNEY INFECTIONS AND STONES"  . COPD (chronic obstructive pulmonary disease) ([place])   . Coronary artery disease   . Dizziness   . H/O: rheumatic fever   . Heart murmur   . Hypertension   . PONV (postoperative nausea and vomiting)    ' SOMETIMES', BUT NOT ALWAYS"  . S/P Maze operation for atrial fibrillation 10/14/2016   Complete bilateral atrial lesion set using cryothermy and bipolar radiofrequency ablation - atrial appendage was not treated due to previous surgical procedure (open mitral commissurotomy)  . S/P mitral valve replacement with bioprosthetic valve 10/14/2016   29 mm Medtronic Mosaic porcine bioprosthetic tissue valve  . UTI (urinary tract infection)   . Valvular heart disease    Has mitral stenosis with prior  mitral commissurotomy in 1970   Past Surgical History:  Procedure Laterality Date  . ABDOMINAL HYSTERECTOMY  1983   endometriosis  . APPENDECTOMY    . BACK SURGERY     neurosurgery x2  . CARDIAC CATHETERIZATION    . CARDIAC CATHETERIZATION N/A 08/03/2016   Procedure: Right/Left Heart Cath and Coronary Angiography;  Surgeon: Peter M Martinique, MD;  Location: [place] [facility];  Service: Cardiovascular;  Laterality: N/A;  . cataract surg    . CHOLECYSTECTOMY N/A 05/30/2015   Procedure: LAPAROSCOPIC CHOLECYSTECTOMY WITH INTRAOPERATIVE CHOLANGIOGRAM;  Surgeon: Excell Seltzer, MD;  Location: [place];  Service: General;  Laterality: N/A;  . COLONOSCOPY    . CORONARY ARTERY BYPASS GRAFT N/A 10/14/2016   Procedure: CORONARY ARTERY BYPASS GRAFTING (CABG);  Surgeon: Rexene Alberts, MD;  Location: [place];  Service: Open Heart Surgery;  Laterality: N/A;  . EYE SURGERY    . MAZE N/A 10/14/2016   Procedure: MAZE;  Surgeon: Rexene Alberts, MD;  Location: [place];  Service: Open Heart Surgery;  Laterality: N/A;  . MITRAL VALVE REPLACEMENT N/A 10/14/2016   Procedure: REDO MITRAL VALVE REPLACEMENT (MVR);  Surgeon: Rexene Alberts, MD;  Location: [place];  Service: Open Heart Surgery;  Laterality: N/A;  . MITRAL VALVE SURGERY Left 1970   Open mitral commissurotomy via left thoracotomy approach  . TEE WITHOUT CARDIOVERSION N/A 07/05/2016   Procedure: TRANSESOPHAGEAL ECHOCARDIOGRAM (TEE);  Surgeon: [name], MD;  [facility];  Service: Cardiovascular;  Laterality: N/A;  . TEE WITHOUT CARDIOVERSION N/A 10/14/2016   Procedure: TRANSESOPHAGEAL ECHOCARDIOGRAM (TEE);  Surgeon: Rexene Alberts, MD;  Location: [place];  Service: Open Heart Surgery;  Laterality: N/A;    reports that she quit smoking about 43 years ago. Her smoking use included cigarettes. She has a 15.00 pack-year smoking history. She has never used smokeless tobacco. She reports that she does not drink alcohol or use drugs. family history  includes Leukemia in her father. Allergies  Allergen Reactions  . Aldactone [Spironolactone] Other (See Comments)    dyspnea  . Amoxicillin Palpitations    Tachycardia Has patient had a PCN reaction causing immediate rash, facial/tongue/throat swelling, SOB or lightheadedness with hypotension: no Has patient had a PCN reaction causing severe rash involving mucus membranes or skin necrosis: {no Has patient had a PCN reaction that required hospitalization {no Has patient had a PCN reaction occurring within the last 10 years: {yes If all of the above answers are "NO", then may proceed with Cephalosporin use.  . Diltiazem Other (See Comments)    Causing headaches   . Flagyl [Metronidazole Hcl] Other (See Comments)    Causing headaches    . Flovent [Fluticasone Propionate] Other (See Comments)    Leg cramps  . Gabapentin Swelling  . Lyrica [Pregabalin] Swelling  . Quinidine Diarrhea and Other (See Comments)    Fever diarrhea  . Simvastatin Other (See Comments)    Leg pain, myalgia  . Tramadol Nausea Only  . Verapamil Other (See Comments)    myalgias  . Amlodipine     Low extremity edema  . Zetia [Ezetimibe]     LEG CRAMPS   . Ace Inhibitors Other (See Comments)    unknown  . Benazepril Hcl Cough  . Ciprocin-Fluocin-Procin [Fluocinolone Acetonide] Other (See Comments)    unknown  . Ciprofloxacin Diarrhea  . Codeine Nausea Only  . Nitrofurantoin Monohyd Macro Nausea Only     Review of Systems  Constitutional: Negative for chills and fever.  Hematological: Negative for adenopathy.       Objective:   Physical Exam Constitutional:      Appearance: Normal appearance.  Skin:    Comments: Patient has flap laceration right lower lateral leg which is approximately 4 cm in length.  No active bleeding.  No signs of secondary infection.  No drainage.  No erythema.  Fairly superficial wound  Neurological:     Mental Status: She is alert.        Assessment:      Laceration right lateral leg which occurred 3 days ago.  No signs of secondary infection    Plan:     -Wound was cleaned with normal saline and dressing reapplied -Continue to clean wound daily with soap and water -Avoid alcohol or peroxide -Follow-up promptly for any signs of secondary infection and these were reviewed  [name] MD [facility]

## 2019-01-01 NOTE — Telephone Encounter (Signed)
Noted  

## 2019-01-01 NOTE — Telephone Encounter (Signed)
Per Dr Elease Hashimoto okay to be seen in parking lot to evaluate leg.  Pt to come around 1:15pm or appt.  Nothing further needed.

## 2019-01-10 ENCOUNTER — Ambulatory Visit: Payer: Self-pay | Admitting: *Deleted

## 2019-01-10 ENCOUNTER — Ambulatory Visit (INDEPENDENT_AMBULATORY_CARE_PROVIDER_SITE_OTHER): Payer: Medicare Other | Admitting: Family Medicine

## 2019-01-10 ENCOUNTER — Other Ambulatory Visit: Payer: Self-pay

## 2019-01-10 DIAGNOSIS — L03115 Cellulitis of right lower limb: Secondary | ICD-10-CM | POA: Diagnosis not present

## 2019-01-10 MED ORDER — CEPHALEXIN 500 MG PO CAPS: 500.0000 mg | ORAL_CAPSULE | Freq: Three times a day (TID) | ORAL | 0 refills | Status: DC

## 2019-01-10 NOTE — Telephone Encounter (Signed)
Patient re injured her leg-  Patient had been letting leg air last week- it was healing well- Thursday she hit it on the recliner and re injured it- she does not think that it is healing well and would like PCP to take another look. Call to office- they are going to schedule patient.  Reason for Disposition . ALSO, superficial cut (scratch) or abrasion (scrape) is present    Patient is on blood thinner has laceration- dressing applied by PCP earlier and instructed on wound care- follow up needed.  Answer Assessment - Initial Assessment Questions 1. MECHANISM: "How did the injury happen?" (e.g., twisting injury, direct blow)      Patient states she hit the same place she injured last Thursday-and she states she is still taking care of area- but it is not getting better. 2. ONSET: "When did the injury happen?" (Minutes or hours ago)      reinjured Thursday 3. LOCATION: "Where is the injury located?"      Left leg- outside of leg 4. APPEARANCE of INJURY: "What does the injury look like?"  (e.g., deformity of leg)     Wound is open and skin is loose and oozing. Patient is cleaning with soap and water to clean. Painful and red around the area- she would like PCP to look at it again. 5. SEVERITY: "Can you put weight on that leg?" "Can you walk?"      Yes- patient can walk on leg- it is stiff in morning 6. SIZE: For cuts, bruises, or swelling, ask: "How large is it?" (e.g., inches or centimeters)      Many be slightly larger where she re injured the wound 7. PAIN: "Is there pain?" If so, ask: "How bad is the pain?"  (Scale 1-10; or mild, moderate, severe)     6-7 on pain scale 8. TETANUS: For any breaks in the skin, ask: "When was the last tetanus booster?"     Up to date 9. OTHER SYMPTOMS: "Do you have any other symptoms?"      No other symptoms 10. PREGNANCY: "Is there any chance you are pregnant?" "When was your last menstrual period?"       n/a  Protocols used: LEG INJURY-A-AH

## 2019-01-10 NOTE — Telephone Encounter (Signed)
Pt scheduled for in-person visit today at 11:30 with Dr Elease Hashimoto - okay per Dr B Pt coming around to back parking lot (white SUV)  Nothing further needed.

## 2019-01-10 NOTE — Telephone Encounter (Signed)
Seen in parking lot.

## 2019-01-10 NOTE — Progress Notes (Signed)
Subjective:     Patient ID: Catherine [name], female   DOB: 1935-07-08, 84 y.o.   MRN: [id number]  HPI Patient was seen 9 days ago with flap laceration right lower leg.  She then unfortunately this past Thursday hit her leg again on the handle of her recliner and she had some recurrent bleeding.  Over the past couple days she has had some slight surrounding erythema and slight warmth and increased tenderness.  No fevers or chills.  She has been soaking this in Epsom salts and using topical Polysporin.  She has history of reported penicillin allergy but has tolerated Keflex in the past  Past Medical History:  Diagnosis Date  . Aortic stenosis, mild 07/26/2017  . Arthritis   . Asthma    last attack 02/2015  . Atrial fibrillation, chronic   . Cellulitis of left lower extremity 08/17/2016   Ulcer associated with severe venous insufficiency  . Chronic anticoagulation   . Chronic diastolic CHF (congestive heart failure) ([place])   . Chronic kidney disease    "RIGHT MANY KIDNEY INFECTIONS AND STONES"  . COPD (chronic obstructive pulmonary disease) ([place])   . Coronary artery disease   . Dizziness   . H/O: rheumatic fever   . Heart murmur   . Hypertension   . PONV (postoperative nausea and vomiting)    ' SOMETIMES', BUT NOT ALWAYS"  . S/P Maze operation for atrial fibrillation 10/14/2016   Complete bilateral atrial lesion set using cryothermy and bipolar radiofrequency ablation - atrial appendage was not treated due to previous surgical procedure (open mitral commissurotomy)  . S/P mitral valve replacement with bioprosthetic valve 10/14/2016   29 mm Medtronic Mosaic porcine bioprosthetic tissue valve  . UTI (urinary tract infection)   . Valvular heart disease    Has mitral stenosis with prior mitral commissurotomy in 1970   Past Surgical History:  Procedure Laterality Date  . ABDOMINAL HYSTERECTOMY  1983   endometriosis  . APPENDECTOMY    . BACK SURGERY     neurosurgery x2  . CARDIAC  CATHETERIZATION    . CARDIAC CATHETERIZATION N/A 08/03/2016   Procedure: Right/Left Heart Cath and Coronary Angiography;  Surgeon: Peter M Martinique, MD;  Location: [place] [facility];  Service: Cardiovascular;  Laterality: N/A;  . cataract surg    . CHOLECYSTECTOMY N/A 05/30/2015   Procedure: LAPAROSCOPIC CHOLECYSTECTOMY WITH INTRAOPERATIVE CHOLANGIOGRAM;  Surgeon: Excell Seltzer, MD;  Location: [place];  Service: General;  Laterality: N/A;  . COLONOSCOPY    . CORONARY ARTERY BYPASS GRAFT N/A 10/14/2016   Procedure: CORONARY ARTERY BYPASS GRAFTING (CABG);  Surgeon: Rexene Alberts, MD;  Location: [place];  Service: Open Heart Surgery;  Laterality: N/A;  . EYE SURGERY    . MAZE N/A 10/14/2016   Procedure: MAZE;  Surgeon: Rexene Alberts, MD;  Location: [place];  Service: Open Heart Surgery;  Laterality: N/A;  . MITRAL VALVE REPLACEMENT N/A 10/14/2016   Procedure: REDO MITRAL VALVE REPLACEMENT (MVR);  Surgeon: Rexene Alberts, MD;  Location: [place];  Service: Open Heart Surgery;  Laterality: N/A;  . MITRAL VALVE SURGERY Left 1970   Open mitral commissurotomy via left thoracotomy approach  . TEE WITHOUT CARDIOVERSION N/A 07/05/2016   Procedure: TRANSESOPHAGEAL ECHOCARDIOGRAM (TEE);  Surgeon: [name], MD;  Location: [place];  Service: Cardiovascular;  Laterality: N/A;  . TEE WITHOUT CARDIOVERSION N/A 10/14/2016   Procedure: TRANSESOPHAGEAL ECHOCARDIOGRAM (TEE);  Surgeon: Rexene Alberts, MD;  Location: [place];  Service: Open Heart Surgery;  Laterality: N/A;    reports that she quit smoking about 43 years ago. Her smoking use included cigarettes. She has a 15.00 pack-year smoking history. She has never used smokeless tobacco. She reports that she does not drink alcohol or use drugs. family history includes Leukemia in her father. Allergies  Allergen Reactions  . Aldactone [Spironolactone] Other (See Comments)    dyspnea  . Amoxicillin Palpitations    Tachycardia Has patient had a PCN reaction  causing immediate rash, facial/tongue/throat swelling, SOB or lightheadedness with hypotension: no Has patient had a PCN reaction causing severe rash involving mucus membranes or skin necrosis: {no Has patient had a PCN reaction that required hospitalization {no Has patient had a PCN reaction occurring within the last 10 years: {yes If all of the above answers are "NO", then may proceed with Cephalosporin use.  . Diltiazem Other (See Comments)    Causing headaches   . Flagyl [Metronidazole Hcl] Other (See Comments)    Causing headaches    . Flovent [Fluticasone Propionate] Other (See Comments)    Leg cramps  . Gabapentin Swelling  . Lyrica [Pregabalin] Swelling  . Quinidine Diarrhea and Other (See Comments)    Fever diarrhea  . Simvastatin Other (See Comments)    Leg pain, myalgia  . Tramadol Nausea Only  . Verapamil Other (See Comments)    myalgias  . Amlodipine     Low extremity edema  . Zetia [Ezetimibe]     LEG CRAMPS   . Ace Inhibitors Other (See Comments)    unknown  . Benazepril Hcl Cough  . Ciprocin-Fluocin-Procin [Fluocinolone Acetonide] Other (See Comments)    unknown  . Ciprofloxacin Diarrhea  . Codeine Nausea Only  . Nitrofurantoin Monohyd Macro Nausea Only     Review of Systems  Constitutional: Negative for chills and fever.       Objective:   Physical Exam Constitutional:      Appearance: Normal appearance.  Skin:    Comments: Patient has flap type laceration right lateral leg.  She has approximately 3 x 4 cm surrounding zone of erythema.  No purulent drainage.  No necrosis.  Minimally tender to palpation.  Neurological:     Mental Status: She is alert.        Assessment:     Right leg wound following injury couple weeks ago and re-injured 6 days ago.  Now has what appears to be some early cellulitis changes.  Nontoxic.    Plan:     -Elevate leg frequently -Continue warm compresses -Keflex 500 mg 3 times daily for 7 days -Follow-up for  any fever, progressive erythema, or other concerns  [name] MD [facility] at [facility]

## 2019-01-15 ENCOUNTER — Other Ambulatory Visit: Payer: Self-pay | Admitting: Family Medicine

## 2019-01-15 MED ORDER — FLUCONAZOLE 150 MG PO TABS: 150.0000 mg | ORAL_TABLET | Freq: Once | ORAL | 0 refills | Status: AC

## 2019-01-15 NOTE — Telephone Encounter (Signed)
Clinic RN spoke with patient. Patient reports she usually ask for yeast medication when she is prescribed antibiotics but forgot to ask when she seen Dr. Elease Hashimoto on 01/10/2019. Patient reports she is now strating to have yeast sx.

## 2019-01-15 NOTE — Addendum Note (Signed)
Addended by: [name] on: 01/15/2019 01:21 PM   Modules accepted: Orders

## 2019-01-16 NOTE — Telephone Encounter (Signed)
Please see message. °

## 2019-01-17 NOTE — Telephone Encounter (Signed)
Fluconazole 150 mg po times one dose 

## 2019-01-22 ENCOUNTER — Ambulatory Visit (INDEPENDENT_AMBULATORY_CARE_PROVIDER_SITE_OTHER): Payer: Medicare Other | Admitting: General Practice

## 2019-01-22 ENCOUNTER — Other Ambulatory Visit: Payer: Self-pay

## 2019-01-22 DIAGNOSIS — I4891 Unspecified atrial fibrillation: Secondary | ICD-10-CM

## 2019-01-22 LAB — POCT INR: INR: 4.6 — AB (ref 2.0–3.0)

## 2019-01-22 NOTE — Patient Instructions (Signed)
Pre visit review using our clinic review tool, if applicable. No additional management support is needed unless otherwise documented below in the visit note.  Hold coumadin today and tomorrow and then continue to take 1 tablet every day except 2 tablets on Mondays and Fridays.  Re-check in 2 weeks.

## 2019-02-05 ENCOUNTER — Ambulatory Visit: Payer: Medicare Other

## 2019-02-07 ENCOUNTER — Ambulatory Visit (INDEPENDENT_AMBULATORY_CARE_PROVIDER_SITE_OTHER): Payer: Medicare Other | Admitting: General Practice

## 2019-02-07 ENCOUNTER — Other Ambulatory Visit: Payer: Self-pay | Admitting: General Practice

## 2019-02-07 ENCOUNTER — Other Ambulatory Visit: Payer: Self-pay

## 2019-02-07 DIAGNOSIS — I4891 Unspecified atrial fibrillation: Secondary | ICD-10-CM

## 2019-02-07 DIAGNOSIS — Z7901 Long term (current) use of anticoagulants: Secondary | ICD-10-CM | POA: Diagnosis not present

## 2019-02-07 DIAGNOSIS — I4821 Permanent atrial fibrillation: Secondary | ICD-10-CM

## 2019-02-07 LAB — POCT INR: INR: 2.9 (ref 2.0–3.0)

## 2019-02-07 MED ORDER — WARFARIN SODIUM 2 MG PO TABS: ORAL_TABLET | ORAL | 1 refills | Status: DC

## 2019-02-07 NOTE — Patient Instructions (Signed)
Pre visit review using our clinic review tool, if applicable. No additional management support is needed unless otherwise documented below in the visit note.  Continue to take 1 tablet every day except 2 tablets on Mondays and Fridays.  Re-check in 4 weeks.

## 2019-02-08 ENCOUNTER — Telehealth: Payer: Self-pay | Admitting: Family Medicine

## 2019-02-08 DIAGNOSIS — S51011A Laceration without foreign body of right elbow, initial encounter: Secondary | ICD-10-CM | POA: Diagnosis not present

## 2019-02-08 DIAGNOSIS — S81811A Laceration without foreign body, right lower leg, initial encounter: Secondary | ICD-10-CM | POA: Diagnosis not present

## 2019-02-08 NOTE — Telephone Encounter (Signed)
Copied from [place] [id number]. Topic: Quick Communication - See Telephone Encounter >> Feb 08, 2019  2:38 PM [name] A wrote: CRM for notification. See Telephone encounter for: 02/08/19. Patient husband is calling to report that the patient fell this morning on the treadmill. And they took her to [facility]. He will faxed the after visit summary.

## 2019-02-09 NOTE — Telephone Encounter (Signed)
Called patient and made her an appointment on 02/12/19 at 11:15am. Gave her the information for her appointment and will be driving a white SUV. Patient verbalized an understanding.

## 2019-02-09 NOTE — Telephone Encounter (Signed)
Would clean gently once daily with soap and water and continue some topical antibiotic and keep covered with a light dressing for the next several days.  Can offer follow-up here if they would like for [name] to look at her wounds sometime early next week

## 2019-02-09 NOTE — Telephone Encounter (Signed)
Called patient and she told me that her leg is hurting and the shin is missing skin and she has bruises all over her left leg and arms and split her lip. Patient stated that they put her on an antibiotic also.   Patient wants to know how long does she need to keep her bandages on her or does she need to change them? She has still had some bleeding.

## 2019-02-12 ENCOUNTER — Other Ambulatory Visit: Payer: Self-pay

## 2019-02-12 ENCOUNTER — Ambulatory Visit (INDEPENDENT_AMBULATORY_CARE_PROVIDER_SITE_OTHER): Payer: Medicare Other | Admitting: Family Medicine

## 2019-02-12 ENCOUNTER — Other Ambulatory Visit: Payer: Self-pay | Admitting: Cardiovascular Disease

## 2019-02-12 ENCOUNTER — Encounter: Payer: Self-pay | Admitting: Family Medicine

## 2019-02-12 VITALS — BP 120/72 | HR 68 | Temp 98.4°F | Ht 64.0 in | Wt 154.9 lb

## 2019-02-12 DIAGNOSIS — S51801D Unspecified open wound of right forearm, subsequent encounter: Secondary | ICD-10-CM | POA: Diagnosis not present

## 2019-02-12 DIAGNOSIS — S81802D Unspecified open wound, left lower leg, subsequent encounter: Secondary | ICD-10-CM | POA: Diagnosis not present

## 2019-02-12 DIAGNOSIS — I4891 Unspecified atrial fibrillation: Secondary | ICD-10-CM | POA: Diagnosis not present

## 2019-02-12 DIAGNOSIS — S01511D Laceration without foreign body of lip, subsequent encounter: Secondary | ICD-10-CM | POA: Diagnosis not present

## 2019-02-12 MED ORDER — MUPIROCIN CALCIUM 2 % EX CREA: TOPICAL_CREAM | CUTANEOUS | 2 refills | Status: DC

## 2019-02-12 NOTE — Progress Notes (Signed)
Subjective:     Patient ID: Catherine [name], female   DOB: Feb 24, 1935, 84 y.o.   MRN: [id number]  HPI Patient is here for urgent care follow-up.  She had an injury at home last Thursday.  She was on a treadmill and apparently was trying to push the buttons to increase acceleration and the treadmill accelerated fairly rapidly.  She lost her balance and went down and fell injuring her left leg, right forearm, and lower lip.  She had significant bleeding.  No loss of consciousness.  No head injury.  No difficulties with ambulation.  She got her husband and they went to local urgent care.  She states that her lower lip was cauterized.  She had Steri-Strips placed of her right forearm and left leg lesion.  Tetanus was 2013.  Patient placed on Keflex 250 mg 4 times daily for 10 days.  She is on Coumadin and has had prior atrial valve replacement.  She also has history of CAD, dyslipidemia, COPD, diastolic heart failure, atrial fibrillation.  She denied any recent head injury.  No difficulties with ambulation.  She has been using some Bactroban ointment topically to the wounds and keeping these covered.  Past Medical History:  Diagnosis Date  . Aortic stenosis, mild 07/26/2017  . Arthritis   . Asthma    last attack 02/2015  . Atrial fibrillation, chronic   . Cellulitis of left lower extremity 08/17/2016   Ulcer associated with severe venous insufficiency  . Chronic anticoagulation   . Chronic diastolic CHF (congestive heart failure) ([place])   . Chronic kidney disease    "RIGHT MANY KIDNEY INFECTIONS AND STONES"  . COPD (chronic obstructive pulmonary disease) ([place])   . Coronary artery disease   . Dizziness   . H/O: rheumatic fever   . Heart murmur   . Hypertension   . PONV (postoperative nausea and vomiting)    ' SOMETIMES', BUT NOT ALWAYS"  . S/P Maze operation for atrial fibrillation 10/14/2016   Complete bilateral atrial lesion set using cryothermy and bipolar radiofrequency ablation - atrial  appendage was not treated due to previous surgical procedure (open mitral commissurotomy)  . S/P mitral valve replacement with bioprosthetic valve 10/14/2016   29 mm Medtronic Mosaic porcine bioprosthetic tissue valve  . UTI (urinary tract infection)   . Valvular heart disease    Has mitral stenosis with prior mitral commissurotomy in 1970   Past Surgical History:  Procedure Laterality Date  . ABDOMINAL HYSTERECTOMY  1983   endometriosis  . APPENDECTOMY    . BACK SURGERY     neurosurgery x2  . CARDIAC CATHETERIZATION    . CARDIAC CATHETERIZATION N/A 08/03/2016   Procedure: Right/Left Heart Cath and Coronary Angiography;  Surgeon: Peter M Martinique, MD;  Location: [facility];  Service: Cardiovascular;  Laterality: N/A;  . cataract surg    . CHOLECYSTECTOMY N/A 05/30/2015   Procedure: LAPAROSCOPIC CHOLECYSTECTOMY WITH INTRAOPERATIVE CHOLANGIOGRAM;  Surgeon: Excell Seltzer, MD;  Location: [place];  Service: General;  Laterality: N/A;  . COLONOSCOPY    . CORONARY ARTERY BYPASS GRAFT N/A 10/14/2016   Procedure: CORONARY ARTERY BYPASS GRAFTING (CABG);  Surgeon: Rexene Alberts, MD;  Location: [place];  Service: Open Heart Surgery;  Laterality: N/A;  . EYE SURGERY    . MAZE N/A 10/14/2016   Procedure: MAZE;  Surgeon: Rexene Alberts, MD;  Location: [place];  Service: Open Heart Surgery;  Laterality: N/A;  . MITRAL VALVE REPLACEMENT N/A 10/14/2016   Procedure:  REDO MITRAL VALVE REPLACEMENT (MVR);  Surgeon: Rexene Alberts, MD;  Location: [place];  Service: Open Heart Surgery;  Laterality: N/A;  . MITRAL VALVE SURGERY Left 1970   Open mitral commissurotomy via left thoracotomy approach  . TEE WITHOUT CARDIOVERSION N/A 07/05/2016   Procedure: TRANSESOPHAGEAL ECHOCARDIOGRAM (TEE);  Surgeon: [name], MD;  Location: [place];  Service: Cardiovascular;  Laterality: N/A;  . TEE WITHOUT CARDIOVERSION N/A 10/14/2016   Procedure: TRANSESOPHAGEAL ECHOCARDIOGRAM (TEE);  Surgeon: Rexene Alberts, MD;   Location: [place];  Service: Open Heart Surgery;  Laterality: N/A;    reports that she quit smoking about 43 years ago. Her smoking use included cigarettes. She has a 15.00 pack-year smoking history. She has never used smokeless tobacco. She reports that she does not drink alcohol or use drugs. family history includes Leukemia in her father. Allergies  Allergen Reactions  . Aldactone [Spironolactone] Other (See Comments)    dyspnea  . Amoxicillin Palpitations    Tachycardia Has patient had a PCN reaction causing immediate rash, facial/tongue/throat swelling, SOB or lightheadedness with hypotension: no Has patient had a PCN reaction causing severe rash involving mucus membranes or skin necrosis: {no Has patient had a PCN reaction that required hospitalization {no Has patient had a PCN reaction occurring within the last 10 years: {yes If all of the above answers are "NO", then may proceed with Cephalosporin use.  . Diltiazem Other (See Comments)    Causing headaches   . Flagyl [Metronidazole Hcl] Other (See Comments)    Causing headaches    . Flovent [Fluticasone Propionate] Other (See Comments)    Leg cramps  . Gabapentin Swelling  . Lyrica [Pregabalin] Swelling  . Quinidine Diarrhea and Other (See Comments)    Fever diarrhea  . Simvastatin Other (See Comments)    Leg pain, myalgia  . Tramadol Nausea Only  . Verapamil Other (See Comments)    myalgias  . Amlodipine     Low extremity edema  . Zetia [Ezetimibe]     LEG CRAMPS   . Ace Inhibitors Other (See Comments)    unknown  . Benazepril Hcl Cough  . Ciprocin-Fluocin-Procin [Fluocinolone Acetonide] Other (See Comments)    unknown  . Ciprofloxacin Diarrhea  . Codeine Nausea Only  . Nitrofurantoin Monohyd Macro Nausea Only     Review of Systems  Constitutional: Negative for appetite change, chills, fever and unexpected weight change.  Respiratory: Negative for shortness of breath.   Cardiovascular: Negative for chest  pain.  Gastrointestinal: Negative for abdominal pain.  Neurological: Negative for dizziness and headaches.  Psychiatric/Behavioral: Negative for confusion.       Objective:   Physical Exam Constitutional:      Appearance: Normal appearance.  HENT:     Head:     Comments: Large eschar lower lip from recent injury.  She has some ecchymosis involving the chin region Neck:     Musculoskeletal: Neck supple.  Cardiovascular:     Rate and Rhythm: Normal rate.  Pulmonary:     Effort: Pulmonary effort is normal.     Breath sounds: Normal breath sounds.  Skin:    Comments: Patient has avulsion type injury involving her right forearm.  No signs of secondary infection.  She has some surrounding ecchymosis.  She has a large avulsed area left anterior leg.  Near the superior portion she has a hematoma.  There is no cellulitis changes.  No drainage.  No foul odor.  She has some surrounding ecchymosis  Neurological:  Mental Status: She is alert.        Assessment:     #1 recent fall with injuries including left anterior leg, right forearm, lower lip.  No signs of secondary infection currently  #2 history of mitral valve replacement and atrial fibrillation on chronic anticoagulation with Coumadin  #3 history of diastolic heart failure currently stable    Plan:     -Wounds were cleaned and redressed -Finish out Keflex -Reviewed appropriate wound care -Return in 4 days to reassess wounds and reassess pro time then -Follow-up sooner for any signs of secondary infection  [name] MD [facility] at [facility]

## 2019-02-12 NOTE — Patient Instructions (Signed)
Keep wounds clean with soap and water..   Follow up on Friday and sooner for signs of progressive infection

## 2019-02-13 ENCOUNTER — Telehealth: Payer: Medicare Other | Admitting: Cardiovascular Disease

## 2019-02-16 ENCOUNTER — Ambulatory Visit: Payer: Medicare Other | Admitting: Family Medicine

## 2019-02-16 ENCOUNTER — Ambulatory Visit (INDEPENDENT_AMBULATORY_CARE_PROVIDER_SITE_OTHER): Payer: Medicare Other | Admitting: Family Medicine

## 2019-02-16 ENCOUNTER — Encounter: Payer: Self-pay | Admitting: Family Medicine

## 2019-02-16 ENCOUNTER — Other Ambulatory Visit: Payer: Self-pay

## 2019-02-16 VITALS — BP 116/64 | HR 82 | Temp 99.0°F | Ht 64.0 in | Wt 151.2 lb

## 2019-02-16 DIAGNOSIS — T45515A Adverse effect of anticoagulants, initial encounter: Secondary | ICD-10-CM

## 2019-02-16 DIAGNOSIS — S8012XD Contusion of left lower leg, subsequent encounter: Secondary | ICD-10-CM

## 2019-02-16 DIAGNOSIS — S80812D Abrasion, left lower leg, subsequent encounter: Secondary | ICD-10-CM | POA: Diagnosis not present

## 2019-02-16 DIAGNOSIS — D6832 Hemorrhagic disorder due to extrinsic circulating anticoagulants: Secondary | ICD-10-CM

## 2019-02-16 DIAGNOSIS — S50811D Abrasion of right forearm, subsequent encounter: Secondary | ICD-10-CM

## 2019-02-16 DIAGNOSIS — S01511D Laceration without foreign body of lip, subsequent encounter: Secondary | ICD-10-CM

## 2019-02-16 LAB — POCT INR: INR: 3.4 — AB (ref 2.0–3.0)

## 2019-02-16 NOTE — Progress Notes (Signed)
Subjective:     Patient ID: Catherine [name], female   DOB: 02/23/1935, 84 y.o.   MRN: [id number]  HPI Patient is seen following recent fall and injury.  Refer to previous note.  She fell while using her treadmill.  She sustained multiple injuries including abrasion/laceration lower lip, abrasion right forearm, complex laceration wound left leg with hematoma.  She has been cleaning this daily with soap and water.  She has been elevating this some.  Some icing.  She was placed on Keflex and finished that on Sunday.  She is had edema as expected.  No increase in erythema.  No fevers or chills.  Is having some left leg pain at the site of the wound.  They are requesting consideration for home health referral.  We had recommended follow-up today for wound recheck and also to assess her INR.  She has had previous issue with increased INR with antibiotics.  She denies any current bleeding complications  Past Medical History:  Diagnosis Date  . Aortic stenosis, mild 07/26/2017  . Arthritis   . Asthma    last attack 02/2015  . Atrial fibrillation, chronic   . Cellulitis of left lower extremity 08/17/2016   Ulcer associated with severe venous insufficiency  . Chronic anticoagulation   . Chronic diastolic CHF (congestive heart failure) ([place])   . Chronic kidney disease    "RIGHT MANY KIDNEY INFECTIONS AND STONES"  . COPD (chronic obstructive pulmonary disease) ([place])   . Coronary artery disease   . Dizziness   . H/O: rheumatic fever   . Heart murmur   . Hypertension   . PONV (postoperative nausea and vomiting)    ' SOMETIMES', BUT NOT ALWAYS"  . S/P Maze operation for atrial fibrillation 10/14/2016   Complete bilateral atrial lesion set using cryothermy and bipolar radiofrequency ablation - atrial appendage was not treated due to previous surgical procedure (open mitral commissurotomy)  . S/P mitral valve replacement with bioprosthetic valve 10/14/2016   29 mm Medtronic Mosaic porcine  bioprosthetic tissue valve  . UTI (urinary tract infection)   . Valvular heart disease    Has mitral stenosis with prior mitral commissurotomy in 1970   Past Surgical History:  Procedure Laterality Date  . ABDOMINAL HYSTERECTOMY  1983   endometriosis  . APPENDECTOMY    . BACK SURGERY     neurosurgery x2  . CARDIAC CATHETERIZATION    . CARDIAC CATHETERIZATION N/A 08/03/2016   Procedure: Right/Left Heart Cath and Coronary Angiography;  Surgeon: Peter M Martinique, MD;  Location: [place] [facility];  Service: Cardiovascular;  Laterality: N/A;  . cataract surg    . CHOLECYSTECTOMY N/A 05/30/2015   Procedure: LAPAROSCOPIC CHOLECYSTECTOMY WITH INTRAOPERATIVE CHOLANGIOGRAM;  Surgeon: Excell Seltzer, MD;  Location: [place];  Service: General;  Laterality: N/A;  . COLONOSCOPY    . CORONARY ARTERY BYPASS GRAFT N/A 10/14/2016   Procedure: CORONARY ARTERY BYPASS GRAFTING (CABG);  Surgeon: Rexene Alberts, MD;  Location: [place];  Service: Open Heart Surgery;  Laterality: N/A;  . EYE SURGERY    . MAZE N/A 10/14/2016   Procedure: MAZE;  Surgeon: Rexene Alberts, MD;  Location: [place];  Service: Open Heart Surgery;  Laterality: N/A;  . MITRAL VALVE REPLACEMENT N/A 10/14/2016   Procedure: REDO MITRAL VALVE REPLACEMENT (MVR);  Surgeon: Rexene Alberts, MD;  Location: [place];  Service: Open Heart Surgery;  Laterality: N/A;  . MITRAL VALVE SURGERY Left 1970   Open mitral commissurotomy via left thoracotomy approach  .  TEE WITHOUT CARDIOVERSION N/A 07/05/2016   Procedure: TRANSESOPHAGEAL ECHOCARDIOGRAM (TEE);  Surgeon: [name], MD;  Location: [place];  Service: Cardiovascular;  Laterality: N/A;  . TEE WITHOUT CARDIOVERSION N/A 10/14/2016   Procedure: TRANSESOPHAGEAL ECHOCARDIOGRAM (TEE);  Surgeon: Rexene Alberts, MD;  Location: [place];  Service: Open Heart Surgery;  Laterality: N/A;    reports that she quit smoking about 43 years ago. Her smoking use included cigarettes. She has a 15.00 pack-year smoking  history. She has never used smokeless tobacco. She reports that she does not drink alcohol or use drugs. family history includes Leukemia in her father. Allergies  Allergen Reactions  . Aldactone [Spironolactone] Other (See Comments)    dyspnea  . Amoxicillin Palpitations    Tachycardia Has patient had a PCN reaction causing immediate rash, facial/tongue/throat swelling, SOB or lightheadedness with hypotension: no Has patient had a PCN reaction causing severe rash involving mucus membranes or skin necrosis: {no Has patient had a PCN reaction that required hospitalization {no Has patient had a PCN reaction occurring within the last 10 years: {yes If all of the above answers are "NO", then may proceed with Cephalosporin use.  . Diltiazem Other (See Comments)    Causing headaches   . Flagyl [Metronidazole Hcl] Other (See Comments)    Causing headaches    . Flovent [Fluticasone Propionate] Other (See Comments)    Leg cramps  . Gabapentin Swelling  . Lyrica [Pregabalin] Swelling  . Quinidine Diarrhea and Other (See Comments)    Fever diarrhea  . Simvastatin Other (See Comments)    Leg pain, myalgia  . Tramadol Nausea Only  . Verapamil Other (See Comments)    myalgias  . Amlodipine     Low extremity edema  . Zetia [Ezetimibe]     LEG CRAMPS   . Ace Inhibitors Other (See Comments)    unknown  . Benazepril Hcl Cough  . Ciprocin-Fluocin-Procin [Fluocinolone Acetonide] Other (See Comments)    unknown  . Ciprofloxacin Diarrhea  . Codeine Nausea Only  . Nitrofurantoin Monohyd Macro Nausea Only     Review of Systems  Constitutional: Negative for chills and fever.  Respiratory: Negative for shortness of breath.   Cardiovascular: Positive for leg swelling. Negative for chest pain and palpitations.  Gastrointestinal: Negative for abdominal pain.  Genitourinary: Negative for dysuria and hematuria.  Neurological: Negative for dizziness and headaches.       Objective:    Physical Exam Constitutional:      Appearance: Normal appearance.  HENT:     Mouth/Throat:     Comments: Healing laceration lower lip near the midline.  This appears to be healing well.  Good exudative material.  No signs of secondary infection Cardiovascular:     Rate and Rhythm: Normal rate.  Pulmonary:     Effort: Pulmonary effort is normal.     Breath sounds: Normal breath sounds.  Skin:    Comments: Abrasion right forearm healing well  She has complex wound left anterior leg.  She has extensive bruising and hematoma along the superior aspect.  No significant erythema.  No purulent secretions.  No odor.  She has significant edema of her left lower extremity from the level of the wound down to the foot and dependent ecchymosis  Neurological:     Mental Status: She is alert.        Assessment:     #1 recent fall with multiple injuries including laceration lower lip, abrasion right forearm, complex wound left leg-healing reasonably well.  She also has hematoma left leg that has diminished somewhat in size compared with couple days ago  #2 history of atrial fibrillation on chronic Coumadin.  At risk for coagulopathy with current use of antibiotic    Plan:     -Reassess INR today -Continue frequent leg elevation -Set up home health referral for skin care to minimize her exposures here -She is aware her leg will likely take several weeks to heal fully.  Reviewed signs and symptoms of secondary infection  [name] MD [facility] at [facility]

## 2019-02-16 NOTE — Patient Instructions (Signed)
We will set up home health referral.   Follow up for any increased redness or fever.   Take one coumadin today instead of two and then continue with usual regimen.

## 2019-02-20 DIAGNOSIS — J449 Chronic obstructive pulmonary disease, unspecified: Secondary | ICD-10-CM | POA: Diagnosis not present

## 2019-02-20 DIAGNOSIS — I130 Hypertensive heart and chronic kidney disease with heart failure and stage 1 through stage 4 chronic kidney disease, or unspecified chronic kidney disease: Secondary | ICD-10-CM | POA: Diagnosis not present

## 2019-02-20 DIAGNOSIS — Z7951 Long term (current) use of inhaled steroids: Secondary | ICD-10-CM | POA: Diagnosis not present

## 2019-02-20 DIAGNOSIS — Z952 Presence of prosthetic heart valve: Secondary | ICD-10-CM | POA: Diagnosis not present

## 2019-02-20 DIAGNOSIS — D6832 Hemorrhagic disorder due to extrinsic circulating anticoagulants: Secondary | ICD-10-CM | POA: Diagnosis not present

## 2019-02-20 DIAGNOSIS — N189 Chronic kidney disease, unspecified: Secondary | ICD-10-CM | POA: Diagnosis not present

## 2019-02-20 DIAGNOSIS — Z951 Presence of aortocoronary bypass graft: Secondary | ICD-10-CM | POA: Diagnosis not present

## 2019-02-20 DIAGNOSIS — S81812D Laceration without foreign body, left lower leg, subsequent encounter: Secondary | ICD-10-CM | POA: Diagnosis not present

## 2019-02-20 DIAGNOSIS — S01511D Laceration without foreign body of lip, subsequent encounter: Secondary | ICD-10-CM | POA: Diagnosis not present

## 2019-02-20 DIAGNOSIS — Z950 Presence of cardiac pacemaker: Secondary | ICD-10-CM | POA: Diagnosis not present

## 2019-02-20 DIAGNOSIS — Z7901 Long term (current) use of anticoagulants: Secondary | ICD-10-CM | POA: Diagnosis not present

## 2019-02-20 DIAGNOSIS — I482 Chronic atrial fibrillation, unspecified: Secondary | ICD-10-CM | POA: Diagnosis not present

## 2019-02-20 DIAGNOSIS — I5032 Chronic diastolic (congestive) heart failure: Secondary | ICD-10-CM | POA: Diagnosis not present

## 2019-02-20 DIAGNOSIS — Z9181 History of falling: Secondary | ICD-10-CM | POA: Diagnosis not present

## 2019-02-20 DIAGNOSIS — Z7982 Long term (current) use of aspirin: Secondary | ICD-10-CM | POA: Diagnosis not present

## 2019-02-20 DIAGNOSIS — S8012XD Contusion of left lower leg, subsequent encounter: Secondary | ICD-10-CM | POA: Diagnosis not present

## 2019-02-20 DIAGNOSIS — W19XXXD Unspecified fall, subsequent encounter: Secondary | ICD-10-CM | POA: Diagnosis not present

## 2019-02-20 DIAGNOSIS — S50811D Abrasion of right forearm, subsequent encounter: Secondary | ICD-10-CM | POA: Diagnosis not present

## 2019-02-20 DIAGNOSIS — I350 Nonrheumatic aortic (valve) stenosis: Secondary | ICD-10-CM | POA: Diagnosis not present

## 2019-02-20 DIAGNOSIS — I2510 Atherosclerotic heart disease of native coronary artery without angina pectoris: Secondary | ICD-10-CM | POA: Diagnosis not present

## 2019-02-20 DIAGNOSIS — T45515D Adverse effect of anticoagulants, subsequent encounter: Secondary | ICD-10-CM | POA: Diagnosis not present

## 2019-02-23 DIAGNOSIS — S50811D Abrasion of right forearm, subsequent encounter: Secondary | ICD-10-CM | POA: Diagnosis not present

## 2019-02-23 DIAGNOSIS — I350 Nonrheumatic aortic (valve) stenosis: Secondary | ICD-10-CM | POA: Diagnosis not present

## 2019-02-23 DIAGNOSIS — Z952 Presence of prosthetic heart valve: Secondary | ICD-10-CM | POA: Diagnosis not present

## 2019-02-23 DIAGNOSIS — W19XXXD Unspecified fall, subsequent encounter: Secondary | ICD-10-CM | POA: Diagnosis not present

## 2019-02-23 DIAGNOSIS — J449 Chronic obstructive pulmonary disease, unspecified: Secondary | ICD-10-CM | POA: Diagnosis not present

## 2019-02-23 DIAGNOSIS — Z7982 Long term (current) use of aspirin: Secondary | ICD-10-CM | POA: Diagnosis not present

## 2019-02-23 DIAGNOSIS — D6832 Hemorrhagic disorder due to extrinsic circulating anticoagulants: Secondary | ICD-10-CM | POA: Diagnosis not present

## 2019-02-23 DIAGNOSIS — I5032 Chronic diastolic (congestive) heart failure: Secondary | ICD-10-CM | POA: Diagnosis not present

## 2019-02-23 DIAGNOSIS — Z9181 History of falling: Secondary | ICD-10-CM | POA: Diagnosis not present

## 2019-02-23 DIAGNOSIS — I130 Hypertensive heart and chronic kidney disease with heart failure and stage 1 through stage 4 chronic kidney disease, or unspecified chronic kidney disease: Secondary | ICD-10-CM | POA: Diagnosis not present

## 2019-02-23 DIAGNOSIS — Z7901 Long term (current) use of anticoagulants: Secondary | ICD-10-CM | POA: Diagnosis not present

## 2019-02-23 DIAGNOSIS — I2510 Atherosclerotic heart disease of native coronary artery without angina pectoris: Secondary | ICD-10-CM | POA: Diagnosis not present

## 2019-02-23 DIAGNOSIS — I482 Chronic atrial fibrillation, unspecified: Secondary | ICD-10-CM | POA: Diagnosis not present

## 2019-02-23 DIAGNOSIS — S01511D Laceration without foreign body of lip, subsequent encounter: Secondary | ICD-10-CM | POA: Diagnosis not present

## 2019-02-23 DIAGNOSIS — Z950 Presence of cardiac pacemaker: Secondary | ICD-10-CM | POA: Diagnosis not present

## 2019-02-23 DIAGNOSIS — T45515D Adverse effect of anticoagulants, subsequent encounter: Secondary | ICD-10-CM | POA: Diagnosis not present

## 2019-02-23 DIAGNOSIS — Z7951 Long term (current) use of inhaled steroids: Secondary | ICD-10-CM | POA: Diagnosis not present

## 2019-02-23 DIAGNOSIS — S8012XD Contusion of left lower leg, subsequent encounter: Secondary | ICD-10-CM | POA: Diagnosis not present

## 2019-02-23 DIAGNOSIS — N189 Chronic kidney disease, unspecified: Secondary | ICD-10-CM | POA: Diagnosis not present

## 2019-02-23 DIAGNOSIS — Z951 Presence of aortocoronary bypass graft: Secondary | ICD-10-CM | POA: Diagnosis not present

## 2019-02-23 DIAGNOSIS — S81812D Laceration without foreign body, left lower leg, subsequent encounter: Secondary | ICD-10-CM | POA: Diagnosis not present

## 2019-02-26 DIAGNOSIS — Z950 Presence of cardiac pacemaker: Secondary | ICD-10-CM | POA: Diagnosis not present

## 2019-02-26 DIAGNOSIS — J449 Chronic obstructive pulmonary disease, unspecified: Secondary | ICD-10-CM | POA: Diagnosis not present

## 2019-02-26 DIAGNOSIS — D6832 Hemorrhagic disorder due to extrinsic circulating anticoagulants: Secondary | ICD-10-CM | POA: Diagnosis not present

## 2019-02-26 DIAGNOSIS — Z9181 History of falling: Secondary | ICD-10-CM | POA: Diagnosis not present

## 2019-02-26 DIAGNOSIS — Z7951 Long term (current) use of inhaled steroids: Secondary | ICD-10-CM | POA: Diagnosis not present

## 2019-02-26 DIAGNOSIS — S81812D Laceration without foreign body, left lower leg, subsequent encounter: Secondary | ICD-10-CM | POA: Diagnosis not present

## 2019-02-26 DIAGNOSIS — Z7901 Long term (current) use of anticoagulants: Secondary | ICD-10-CM | POA: Diagnosis not present

## 2019-02-26 DIAGNOSIS — W19XXXD Unspecified fall, subsequent encounter: Secondary | ICD-10-CM | POA: Diagnosis not present

## 2019-02-26 DIAGNOSIS — I5032 Chronic diastolic (congestive) heart failure: Secondary | ICD-10-CM | POA: Diagnosis not present

## 2019-02-26 DIAGNOSIS — I130 Hypertensive heart and chronic kidney disease with heart failure and stage 1 through stage 4 chronic kidney disease, or unspecified chronic kidney disease: Secondary | ICD-10-CM | POA: Diagnosis not present

## 2019-02-26 DIAGNOSIS — T45515D Adverse effect of anticoagulants, subsequent encounter: Secondary | ICD-10-CM | POA: Diagnosis not present

## 2019-02-26 DIAGNOSIS — Z7982 Long term (current) use of aspirin: Secondary | ICD-10-CM | POA: Diagnosis not present

## 2019-02-26 DIAGNOSIS — I2510 Atherosclerotic heart disease of native coronary artery without angina pectoris: Secondary | ICD-10-CM | POA: Diagnosis not present

## 2019-02-26 DIAGNOSIS — S50811D Abrasion of right forearm, subsequent encounter: Secondary | ICD-10-CM | POA: Diagnosis not present

## 2019-02-26 DIAGNOSIS — Z952 Presence of prosthetic heart valve: Secondary | ICD-10-CM | POA: Diagnosis not present

## 2019-02-26 DIAGNOSIS — S8012XD Contusion of left lower leg, subsequent encounter: Secondary | ICD-10-CM | POA: Diagnosis not present

## 2019-02-26 DIAGNOSIS — N189 Chronic kidney disease, unspecified: Secondary | ICD-10-CM | POA: Diagnosis not present

## 2019-02-26 DIAGNOSIS — S01511D Laceration without foreign body of lip, subsequent encounter: Secondary | ICD-10-CM | POA: Diagnosis not present

## 2019-02-26 DIAGNOSIS — Z951 Presence of aortocoronary bypass graft: Secondary | ICD-10-CM | POA: Diagnosis not present

## 2019-02-26 DIAGNOSIS — I350 Nonrheumatic aortic (valve) stenosis: Secondary | ICD-10-CM | POA: Diagnosis not present

## 2019-02-26 DIAGNOSIS — I482 Chronic atrial fibrillation, unspecified: Secondary | ICD-10-CM | POA: Diagnosis not present

## 2019-02-28 DIAGNOSIS — Z952 Presence of prosthetic heart valve: Secondary | ICD-10-CM | POA: Diagnosis not present

## 2019-02-28 DIAGNOSIS — I5032 Chronic diastolic (congestive) heart failure: Secondary | ICD-10-CM | POA: Diagnosis not present

## 2019-02-28 DIAGNOSIS — N189 Chronic kidney disease, unspecified: Secondary | ICD-10-CM | POA: Diagnosis not present

## 2019-02-28 DIAGNOSIS — Z9181 History of falling: Secondary | ICD-10-CM | POA: Diagnosis not present

## 2019-02-28 DIAGNOSIS — Z7901 Long term (current) use of anticoagulants: Secondary | ICD-10-CM | POA: Diagnosis not present

## 2019-02-28 DIAGNOSIS — W19XXXD Unspecified fall, subsequent encounter: Secondary | ICD-10-CM | POA: Diagnosis not present

## 2019-02-28 DIAGNOSIS — T45515D Adverse effect of anticoagulants, subsequent encounter: Secondary | ICD-10-CM | POA: Diagnosis not present

## 2019-02-28 DIAGNOSIS — I2510 Atherosclerotic heart disease of native coronary artery without angina pectoris: Secondary | ICD-10-CM | POA: Diagnosis not present

## 2019-02-28 DIAGNOSIS — Z950 Presence of cardiac pacemaker: Secondary | ICD-10-CM | POA: Diagnosis not present

## 2019-02-28 DIAGNOSIS — S50811D Abrasion of right forearm, subsequent encounter: Secondary | ICD-10-CM | POA: Diagnosis not present

## 2019-02-28 DIAGNOSIS — S81812D Laceration without foreign body, left lower leg, subsequent encounter: Secondary | ICD-10-CM | POA: Diagnosis not present

## 2019-02-28 DIAGNOSIS — S8012XD Contusion of left lower leg, subsequent encounter: Secondary | ICD-10-CM | POA: Diagnosis not present

## 2019-02-28 DIAGNOSIS — Z7982 Long term (current) use of aspirin: Secondary | ICD-10-CM | POA: Diagnosis not present

## 2019-02-28 DIAGNOSIS — J449 Chronic obstructive pulmonary disease, unspecified: Secondary | ICD-10-CM | POA: Diagnosis not present

## 2019-02-28 DIAGNOSIS — Z951 Presence of aortocoronary bypass graft: Secondary | ICD-10-CM | POA: Diagnosis not present

## 2019-02-28 DIAGNOSIS — S01511D Laceration without foreign body of lip, subsequent encounter: Secondary | ICD-10-CM | POA: Diagnosis not present

## 2019-02-28 DIAGNOSIS — I482 Chronic atrial fibrillation, unspecified: Secondary | ICD-10-CM | POA: Diagnosis not present

## 2019-02-28 DIAGNOSIS — I350 Nonrheumatic aortic (valve) stenosis: Secondary | ICD-10-CM | POA: Diagnosis not present

## 2019-02-28 DIAGNOSIS — Z7951 Long term (current) use of inhaled steroids: Secondary | ICD-10-CM | POA: Diagnosis not present

## 2019-02-28 DIAGNOSIS — D6832 Hemorrhagic disorder due to extrinsic circulating anticoagulants: Secondary | ICD-10-CM | POA: Diagnosis not present

## 2019-02-28 DIAGNOSIS — I130 Hypertensive heart and chronic kidney disease with heart failure and stage 1 through stage 4 chronic kidney disease, or unspecified chronic kidney disease: Secondary | ICD-10-CM | POA: Diagnosis not present

## 2019-03-02 DIAGNOSIS — Z950 Presence of cardiac pacemaker: Secondary | ICD-10-CM | POA: Diagnosis not present

## 2019-03-02 DIAGNOSIS — Z7951 Long term (current) use of inhaled steroids: Secondary | ICD-10-CM | POA: Diagnosis not present

## 2019-03-02 DIAGNOSIS — J449 Chronic obstructive pulmonary disease, unspecified: Secondary | ICD-10-CM | POA: Diagnosis not present

## 2019-03-02 DIAGNOSIS — I2510 Atherosclerotic heart disease of native coronary artery without angina pectoris: Secondary | ICD-10-CM | POA: Diagnosis not present

## 2019-03-02 DIAGNOSIS — Z7901 Long term (current) use of anticoagulants: Secondary | ICD-10-CM | POA: Diagnosis not present

## 2019-03-02 DIAGNOSIS — Z9181 History of falling: Secondary | ICD-10-CM | POA: Diagnosis not present

## 2019-03-02 DIAGNOSIS — T45515D Adverse effect of anticoagulants, subsequent encounter: Secondary | ICD-10-CM | POA: Diagnosis not present

## 2019-03-02 DIAGNOSIS — N189 Chronic kidney disease, unspecified: Secondary | ICD-10-CM | POA: Diagnosis not present

## 2019-03-02 DIAGNOSIS — S50811D Abrasion of right forearm, subsequent encounter: Secondary | ICD-10-CM | POA: Diagnosis not present

## 2019-03-02 DIAGNOSIS — I130 Hypertensive heart and chronic kidney disease with heart failure and stage 1 through stage 4 chronic kidney disease, or unspecified chronic kidney disease: Secondary | ICD-10-CM | POA: Diagnosis not present

## 2019-03-02 DIAGNOSIS — I482 Chronic atrial fibrillation, unspecified: Secondary | ICD-10-CM | POA: Diagnosis not present

## 2019-03-02 DIAGNOSIS — S81812D Laceration without foreign body, left lower leg, subsequent encounter: Secondary | ICD-10-CM | POA: Diagnosis not present

## 2019-03-02 DIAGNOSIS — D6832 Hemorrhagic disorder due to extrinsic circulating anticoagulants: Secondary | ICD-10-CM | POA: Diagnosis not present

## 2019-03-02 DIAGNOSIS — W19XXXD Unspecified fall, subsequent encounter: Secondary | ICD-10-CM | POA: Diagnosis not present

## 2019-03-02 DIAGNOSIS — Z7982 Long term (current) use of aspirin: Secondary | ICD-10-CM | POA: Diagnosis not present

## 2019-03-02 DIAGNOSIS — I350 Nonrheumatic aortic (valve) stenosis: Secondary | ICD-10-CM | POA: Diagnosis not present

## 2019-03-02 DIAGNOSIS — I5032 Chronic diastolic (congestive) heart failure: Secondary | ICD-10-CM | POA: Diagnosis not present

## 2019-03-02 DIAGNOSIS — S8012XD Contusion of left lower leg, subsequent encounter: Secondary | ICD-10-CM | POA: Diagnosis not present

## 2019-03-02 DIAGNOSIS — Z951 Presence of aortocoronary bypass graft: Secondary | ICD-10-CM | POA: Diagnosis not present

## 2019-03-02 DIAGNOSIS — S80812D Abrasion, left lower leg, subsequent encounter: Secondary | ICD-10-CM | POA: Diagnosis not present

## 2019-03-02 DIAGNOSIS — S01511D Laceration without foreign body of lip, subsequent encounter: Secondary | ICD-10-CM | POA: Diagnosis not present

## 2019-03-02 DIAGNOSIS — Z952 Presence of prosthetic heart valve: Secondary | ICD-10-CM | POA: Diagnosis not present

## 2019-03-05 DIAGNOSIS — S81812D Laceration without foreign body, left lower leg, subsequent encounter: Secondary | ICD-10-CM | POA: Diagnosis not present

## 2019-03-05 DIAGNOSIS — S50811D Abrasion of right forearm, subsequent encounter: Secondary | ICD-10-CM | POA: Diagnosis not present

## 2019-03-05 DIAGNOSIS — Z950 Presence of cardiac pacemaker: Secondary | ICD-10-CM | POA: Diagnosis not present

## 2019-03-05 DIAGNOSIS — S80812D Abrasion, left lower leg, subsequent encounter: Secondary | ICD-10-CM | POA: Diagnosis not present

## 2019-03-05 DIAGNOSIS — I350 Nonrheumatic aortic (valve) stenosis: Secondary | ICD-10-CM | POA: Diagnosis not present

## 2019-03-05 DIAGNOSIS — Z7901 Long term (current) use of anticoagulants: Secondary | ICD-10-CM | POA: Diagnosis not present

## 2019-03-05 DIAGNOSIS — Z951 Presence of aortocoronary bypass graft: Secondary | ICD-10-CM | POA: Diagnosis not present

## 2019-03-05 DIAGNOSIS — W19XXXD Unspecified fall, subsequent encounter: Secondary | ICD-10-CM | POA: Diagnosis not present

## 2019-03-05 DIAGNOSIS — T45515D Adverse effect of anticoagulants, subsequent encounter: Secondary | ICD-10-CM | POA: Diagnosis not present

## 2019-03-05 DIAGNOSIS — I5032 Chronic diastolic (congestive) heart failure: Secondary | ICD-10-CM | POA: Diagnosis not present

## 2019-03-05 DIAGNOSIS — N189 Chronic kidney disease, unspecified: Secondary | ICD-10-CM | POA: Diagnosis not present

## 2019-03-05 DIAGNOSIS — D6832 Hemorrhagic disorder due to extrinsic circulating anticoagulants: Secondary | ICD-10-CM | POA: Diagnosis not present

## 2019-03-05 DIAGNOSIS — S01511D Laceration without foreign body of lip, subsequent encounter: Secondary | ICD-10-CM | POA: Diagnosis not present

## 2019-03-05 DIAGNOSIS — Z7982 Long term (current) use of aspirin: Secondary | ICD-10-CM | POA: Diagnosis not present

## 2019-03-05 DIAGNOSIS — J449 Chronic obstructive pulmonary disease, unspecified: Secondary | ICD-10-CM | POA: Diagnosis not present

## 2019-03-05 DIAGNOSIS — I2510 Atherosclerotic heart disease of native coronary artery without angina pectoris: Secondary | ICD-10-CM | POA: Diagnosis not present

## 2019-03-05 DIAGNOSIS — I482 Chronic atrial fibrillation, unspecified: Secondary | ICD-10-CM | POA: Diagnosis not present

## 2019-03-05 DIAGNOSIS — Z7951 Long term (current) use of inhaled steroids: Secondary | ICD-10-CM | POA: Diagnosis not present

## 2019-03-05 DIAGNOSIS — Z952 Presence of prosthetic heart valve: Secondary | ICD-10-CM | POA: Diagnosis not present

## 2019-03-05 DIAGNOSIS — I130 Hypertensive heart and chronic kidney disease with heart failure and stage 1 through stage 4 chronic kidney disease, or unspecified chronic kidney disease: Secondary | ICD-10-CM | POA: Diagnosis not present

## 2019-03-05 DIAGNOSIS — S8012XD Contusion of left lower leg, subsequent encounter: Secondary | ICD-10-CM | POA: Diagnosis not present

## 2019-03-05 DIAGNOSIS — Z9181 History of falling: Secondary | ICD-10-CM | POA: Diagnosis not present

## 2019-03-07 ENCOUNTER — Ambulatory Visit (INDEPENDENT_AMBULATORY_CARE_PROVIDER_SITE_OTHER): Payer: Medicare Other | Admitting: General Practice

## 2019-03-07 ENCOUNTER — Other Ambulatory Visit: Payer: Self-pay

## 2019-03-07 ENCOUNTER — Encounter: Payer: Self-pay | Admitting: Family Medicine

## 2019-03-07 ENCOUNTER — Ambulatory Visit (INDEPENDENT_AMBULATORY_CARE_PROVIDER_SITE_OTHER): Payer: Medicare Other | Admitting: Family Medicine

## 2019-03-07 VITALS — BP 130/74 | HR 81 | Temp 98.4°F | Wt 153.7 lb

## 2019-03-07 DIAGNOSIS — I4891 Unspecified atrial fibrillation: Secondary | ICD-10-CM

## 2019-03-07 DIAGNOSIS — S81801D Unspecified open wound, right lower leg, subsequent encounter: Secondary | ICD-10-CM

## 2019-03-07 LAB — POCT INR: INR: 4 — AB (ref 2.0–3.0)

## 2019-03-07 NOTE — Patient Instructions (Addendum)
Pre visit review using our clinic review tool, if applicable. No additional management support is needed unless otherwise documented below in the visit note.  Hold coumadin today and tomorrow and then change dosage and take 1 tablet daily except 2 tablets only on Mondays.  Re-check in 2 weeks.

## 2019-03-07 NOTE — Progress Notes (Signed)
Subjective:     Patient ID: Catherine [name], female   DOB: 17-Jul-1935, 84 y.o.   MRN: [id number]  HPI  Patient here to reassess left leg wound.  Refer to previous notes.  She had injury back in early April.  She fell when she was on her treadmill and had a hematoma and laceration/abrasion right leg..  She developed some subsequent cellulitis changes and was treated with Keflex.  She does take Coumadin.  She is here today for INR.  She has home health nursing coming out to check her wounds a few times per week.  They are redressing this.  She has had excellent granulation tissue.  Hematoma gradually reducing in size.  No foul odor.  No purulent drainage.  Past Medical History:  Diagnosis Date  . Aortic stenosis, mild 07/26/2017  . Arthritis   . Asthma    last attack 02/2015  . Atrial fibrillation, chronic   . Cellulitis of left lower extremity 08/17/2016   Ulcer associated with severe venous insufficiency  . Chronic anticoagulation   . Chronic diastolic CHF (congestive heart failure) ([place])   . Chronic kidney disease    "RIGHT MANY KIDNEY INFECTIONS AND STONES"  . COPD (chronic obstructive pulmonary disease) ([place])   . Coronary artery disease   . Dizziness   . H/O: rheumatic fever   . Heart murmur   . Hypertension   . PONV (postoperative nausea and vomiting)    ' SOMETIMES', BUT NOT ALWAYS"  . S/P Maze operation for atrial fibrillation 10/14/2016   Complete bilateral atrial lesion set using cryothermy and bipolar radiofrequency ablation - atrial appendage was not treated due to previous surgical procedure (open mitral commissurotomy)  . S/P mitral valve replacement with bioprosthetic valve 10/14/2016   29 mm Medtronic Mosaic porcine bioprosthetic tissue valve  . UTI (urinary tract infection)   . Valvular heart disease    Has mitral stenosis with prior mitral commissurotomy in 1970   Past Surgical History:  Procedure Laterality Date  . ABDOMINAL HYSTERECTOMY  1983   endometriosis  .  APPENDECTOMY    . BACK SURGERY     neurosurgery x2  . CARDIAC CATHETERIZATION    . CARDIAC CATHETERIZATION N/A 08/03/2016   Procedure: Right/Left Heart Cath and Coronary Angiography;  Surgeon: Peter M Martinique, MD;  Location: [place] [facility];  Service: Cardiovascular;  Laterality: N/A;  . cataract surg    . CHOLECYSTECTOMY N/A 05/30/2015   Procedure: LAPAROSCOPIC CHOLECYSTECTOMY WITH INTRAOPERATIVE CHOLANGIOGRAM;  Surgeon: Excell Seltzer, MD;  Location: [place];  Service: General;  Laterality: N/A;  . COLONOSCOPY    . CORONARY ARTERY BYPASS GRAFT N/A 10/14/2016   Procedure: CORONARY ARTERY BYPASS GRAFTING (CABG);  Surgeon: Rexene Alberts, MD;  Location: [place];  Service: Open Heart Surgery;  Laterality: N/A;  . EYE SURGERY    . MAZE N/A 10/14/2016   Procedure: MAZE;  Surgeon: Rexene Alberts, MD;  Location: [place];  Service: Open Heart Surgery;  Laterality: N/A;  . MITRAL VALVE REPLACEMENT N/A 10/14/2016   Procedure: REDO MITRAL VALVE REPLACEMENT (MVR);  Surgeon: Rexene Alberts, MD;  Location: [place];  Service: Open Heart Surgery;  Laterality: N/A;  . MITRAL VALVE SURGERY Left 1970   Open mitral commissurotomy via left thoracotomy approach  . TEE WITHOUT CARDIOVERSION N/A 07/05/2016   Procedure: TRANSESOPHAGEAL ECHOCARDIOGRAM (TEE);  Surgeon: [name], MD;  Location: [place];  Service: Cardiovascular;  Laterality: N/A;  . TEE WITHOUT CARDIOVERSION N/A 10/14/2016   Procedure:  TRANSESOPHAGEAL ECHOCARDIOGRAM (TEE);  Surgeon: Rexene Alberts, MD;  Location: [place];  Service: Open Heart Surgery;  Laterality: N/A;    reports that she quit smoking about 43 years ago. Her smoking use included cigarettes. She has a 15.00 pack-year smoking history. She has never used smokeless tobacco. She reports that she does not drink alcohol or use drugs. family history includes Leukemia in her father. Allergies  Allergen Reactions  . Aldactone [Spironolactone] Other (See Comments)    dyspnea  .  Amoxicillin Palpitations    Tachycardia Has patient had a PCN reaction causing immediate rash, facial/tongue/throat swelling, SOB or lightheadedness with hypotension: no Has patient had a PCN reaction causing severe rash involving mucus membranes or skin necrosis: {no Has patient had a PCN reaction that required hospitalization {no Has patient had a PCN reaction occurring within the last 10 years: {yes If all of the above answers are "NO", then may proceed with Cephalosporin use.  . Diltiazem Other (See Comments)    Causing headaches   . Flagyl [Metronidazole Hcl] Other (See Comments)    Causing headaches    . Flovent [Fluticasone Propionate] Other (See Comments)    Leg cramps  . Gabapentin Swelling  . Lyrica [Pregabalin] Swelling  . Quinidine Diarrhea and Other (See Comments)    Fever diarrhea  . Simvastatin Other (See Comments)    Leg pain, myalgia  . Tramadol Nausea Only  . Verapamil Other (See Comments)    myalgias  . Amlodipine     Low extremity edema  . Zetia [Ezetimibe]     LEG CRAMPS   . Ace Inhibitors Other (See Comments)    unknown  . Benazepril Hcl Cough  . Ciprocin-Fluocin-Procin [Fluocinolone Acetonide] Other (See Comments)    unknown  . Ciprofloxacin Diarrhea  . Codeine Nausea Only  . Nitrofurantoin Monohyd Macro Nausea Only    Review of Systems  Constitutional: Negative for chills and fever.       Objective:   Physical Exam Constitutional:      Appearance: Normal appearance.  Skin:    Comments: Wound right leg which is 8 cm in length and 3 cm at its widest dimension.  She has excellent granulation tissue.  No necrotic tissue.  No cellulitis changes.  No drainage.  No foul odor.  Neurological:     Mental Status: She is alert.        Assessment:     Complex wound right leg which is slowly healing.  She has hematoma along the superior portion which is soft and slowly reducing in size.  No signs of secondary infection    Plan:     -Continue  frequent elevation -Continue home health nursing -Patient will have repeat pro time here in 2 weeks and reassess at that time  [name] MD [facility] at [facility]

## 2019-03-09 DIAGNOSIS — Z7951 Long term (current) use of inhaled steroids: Secondary | ICD-10-CM | POA: Diagnosis not present

## 2019-03-09 DIAGNOSIS — T45515D Adverse effect of anticoagulants, subsequent encounter: Secondary | ICD-10-CM | POA: Diagnosis not present

## 2019-03-09 DIAGNOSIS — Z7901 Long term (current) use of anticoagulants: Secondary | ICD-10-CM | POA: Diagnosis not present

## 2019-03-09 DIAGNOSIS — I482 Chronic atrial fibrillation, unspecified: Secondary | ICD-10-CM | POA: Diagnosis not present

## 2019-03-09 DIAGNOSIS — D6832 Hemorrhagic disorder due to extrinsic circulating anticoagulants: Secondary | ICD-10-CM | POA: Diagnosis not present

## 2019-03-09 DIAGNOSIS — Z9181 History of falling: Secondary | ICD-10-CM | POA: Diagnosis not present

## 2019-03-09 DIAGNOSIS — S01511D Laceration without foreign body of lip, subsequent encounter: Secondary | ICD-10-CM | POA: Diagnosis not present

## 2019-03-09 DIAGNOSIS — Z7982 Long term (current) use of aspirin: Secondary | ICD-10-CM | POA: Diagnosis not present

## 2019-03-09 DIAGNOSIS — W19XXXD Unspecified fall, subsequent encounter: Secondary | ICD-10-CM | POA: Diagnosis not present

## 2019-03-09 DIAGNOSIS — Z951 Presence of aortocoronary bypass graft: Secondary | ICD-10-CM | POA: Diagnosis not present

## 2019-03-09 DIAGNOSIS — S50811D Abrasion of right forearm, subsequent encounter: Secondary | ICD-10-CM | POA: Diagnosis not present

## 2019-03-09 DIAGNOSIS — Z950 Presence of cardiac pacemaker: Secondary | ICD-10-CM | POA: Diagnosis not present

## 2019-03-09 DIAGNOSIS — S8012XD Contusion of left lower leg, subsequent encounter: Secondary | ICD-10-CM | POA: Diagnosis not present

## 2019-03-09 DIAGNOSIS — Z952 Presence of prosthetic heart valve: Secondary | ICD-10-CM | POA: Diagnosis not present

## 2019-03-09 DIAGNOSIS — I350 Nonrheumatic aortic (valve) stenosis: Secondary | ICD-10-CM | POA: Diagnosis not present

## 2019-03-09 DIAGNOSIS — N189 Chronic kidney disease, unspecified: Secondary | ICD-10-CM | POA: Diagnosis not present

## 2019-03-09 DIAGNOSIS — S81812D Laceration without foreign body, left lower leg, subsequent encounter: Secondary | ICD-10-CM | POA: Diagnosis not present

## 2019-03-09 DIAGNOSIS — I130 Hypertensive heart and chronic kidney disease with heart failure and stage 1 through stage 4 chronic kidney disease, or unspecified chronic kidney disease: Secondary | ICD-10-CM | POA: Diagnosis not present

## 2019-03-09 DIAGNOSIS — I2510 Atherosclerotic heart disease of native coronary artery without angina pectoris: Secondary | ICD-10-CM | POA: Diagnosis not present

## 2019-03-09 DIAGNOSIS — J449 Chronic obstructive pulmonary disease, unspecified: Secondary | ICD-10-CM | POA: Diagnosis not present

## 2019-03-09 DIAGNOSIS — I5032 Chronic diastolic (congestive) heart failure: Secondary | ICD-10-CM | POA: Diagnosis not present

## 2019-03-12 DIAGNOSIS — S8012XD Contusion of left lower leg, subsequent encounter: Secondary | ICD-10-CM | POA: Diagnosis not present

## 2019-03-12 DIAGNOSIS — D6832 Hemorrhagic disorder due to extrinsic circulating anticoagulants: Secondary | ICD-10-CM | POA: Diagnosis not present

## 2019-03-12 DIAGNOSIS — I5032 Chronic diastolic (congestive) heart failure: Secondary | ICD-10-CM | POA: Diagnosis not present

## 2019-03-12 DIAGNOSIS — Z7982 Long term (current) use of aspirin: Secondary | ICD-10-CM | POA: Diagnosis not present

## 2019-03-12 DIAGNOSIS — J449 Chronic obstructive pulmonary disease, unspecified: Secondary | ICD-10-CM | POA: Diagnosis not present

## 2019-03-12 DIAGNOSIS — S01511D Laceration without foreign body of lip, subsequent encounter: Secondary | ICD-10-CM | POA: Diagnosis not present

## 2019-03-12 DIAGNOSIS — I350 Nonrheumatic aortic (valve) stenosis: Secondary | ICD-10-CM | POA: Diagnosis not present

## 2019-03-12 DIAGNOSIS — Z7951 Long term (current) use of inhaled steroids: Secondary | ICD-10-CM | POA: Diagnosis not present

## 2019-03-12 DIAGNOSIS — Z951 Presence of aortocoronary bypass graft: Secondary | ICD-10-CM | POA: Diagnosis not present

## 2019-03-12 DIAGNOSIS — N189 Chronic kidney disease, unspecified: Secondary | ICD-10-CM | POA: Diagnosis not present

## 2019-03-12 DIAGNOSIS — I130 Hypertensive heart and chronic kidney disease with heart failure and stage 1 through stage 4 chronic kidney disease, or unspecified chronic kidney disease: Secondary | ICD-10-CM | POA: Diagnosis not present

## 2019-03-12 DIAGNOSIS — I482 Chronic atrial fibrillation, unspecified: Secondary | ICD-10-CM | POA: Diagnosis not present

## 2019-03-12 DIAGNOSIS — W19XXXD Unspecified fall, subsequent encounter: Secondary | ICD-10-CM | POA: Diagnosis not present

## 2019-03-12 DIAGNOSIS — S50811D Abrasion of right forearm, subsequent encounter: Secondary | ICD-10-CM | POA: Diagnosis not present

## 2019-03-12 DIAGNOSIS — Z952 Presence of prosthetic heart valve: Secondary | ICD-10-CM | POA: Diagnosis not present

## 2019-03-12 DIAGNOSIS — Z9181 History of falling: Secondary | ICD-10-CM | POA: Diagnosis not present

## 2019-03-12 DIAGNOSIS — S81812D Laceration without foreign body, left lower leg, subsequent encounter: Secondary | ICD-10-CM | POA: Diagnosis not present

## 2019-03-12 DIAGNOSIS — T45515D Adverse effect of anticoagulants, subsequent encounter: Secondary | ICD-10-CM | POA: Diagnosis not present

## 2019-03-12 DIAGNOSIS — Z950 Presence of cardiac pacemaker: Secondary | ICD-10-CM | POA: Diagnosis not present

## 2019-03-12 DIAGNOSIS — I2510 Atherosclerotic heart disease of native coronary artery without angina pectoris: Secondary | ICD-10-CM | POA: Diagnosis not present

## 2019-03-12 DIAGNOSIS — Z7901 Long term (current) use of anticoagulants: Secondary | ICD-10-CM | POA: Diagnosis not present

## 2019-03-14 DIAGNOSIS — I350 Nonrheumatic aortic (valve) stenosis: Secondary | ICD-10-CM | POA: Diagnosis not present

## 2019-03-14 DIAGNOSIS — N189 Chronic kidney disease, unspecified: Secondary | ICD-10-CM | POA: Diagnosis not present

## 2019-03-14 DIAGNOSIS — I130 Hypertensive heart and chronic kidney disease with heart failure and stage 1 through stage 4 chronic kidney disease, or unspecified chronic kidney disease: Secondary | ICD-10-CM | POA: Diagnosis not present

## 2019-03-14 DIAGNOSIS — Z951 Presence of aortocoronary bypass graft: Secondary | ICD-10-CM | POA: Diagnosis not present

## 2019-03-14 DIAGNOSIS — T45515D Adverse effect of anticoagulants, subsequent encounter: Secondary | ICD-10-CM | POA: Diagnosis not present

## 2019-03-14 DIAGNOSIS — S81812D Laceration without foreign body, left lower leg, subsequent encounter: Secondary | ICD-10-CM | POA: Diagnosis not present

## 2019-03-14 DIAGNOSIS — J449 Chronic obstructive pulmonary disease, unspecified: Secondary | ICD-10-CM | POA: Diagnosis not present

## 2019-03-14 DIAGNOSIS — Z952 Presence of prosthetic heart valve: Secondary | ICD-10-CM | POA: Diagnosis not present

## 2019-03-14 DIAGNOSIS — I5032 Chronic diastolic (congestive) heart failure: Secondary | ICD-10-CM | POA: Diagnosis not present

## 2019-03-14 DIAGNOSIS — I482 Chronic atrial fibrillation, unspecified: Secondary | ICD-10-CM | POA: Diagnosis not present

## 2019-03-14 DIAGNOSIS — Z9181 History of falling: Secondary | ICD-10-CM | POA: Diagnosis not present

## 2019-03-14 DIAGNOSIS — Z7901 Long term (current) use of anticoagulants: Secondary | ICD-10-CM | POA: Diagnosis not present

## 2019-03-14 DIAGNOSIS — I2510 Atherosclerotic heart disease of native coronary artery without angina pectoris: Secondary | ICD-10-CM | POA: Diagnosis not present

## 2019-03-14 DIAGNOSIS — W19XXXD Unspecified fall, subsequent encounter: Secondary | ICD-10-CM | POA: Diagnosis not present

## 2019-03-14 DIAGNOSIS — Z7982 Long term (current) use of aspirin: Secondary | ICD-10-CM | POA: Diagnosis not present

## 2019-03-14 DIAGNOSIS — S8012XD Contusion of left lower leg, subsequent encounter: Secondary | ICD-10-CM | POA: Diagnosis not present

## 2019-03-14 DIAGNOSIS — Z950 Presence of cardiac pacemaker: Secondary | ICD-10-CM | POA: Diagnosis not present

## 2019-03-14 DIAGNOSIS — Z7951 Long term (current) use of inhaled steroids: Secondary | ICD-10-CM | POA: Diagnosis not present

## 2019-03-14 DIAGNOSIS — S50811D Abrasion of right forearm, subsequent encounter: Secondary | ICD-10-CM | POA: Diagnosis not present

## 2019-03-14 DIAGNOSIS — D6832 Hemorrhagic disorder due to extrinsic circulating anticoagulants: Secondary | ICD-10-CM | POA: Diagnosis not present

## 2019-03-14 DIAGNOSIS — S01511D Laceration without foreign body of lip, subsequent encounter: Secondary | ICD-10-CM | POA: Diagnosis not present

## 2019-03-16 DIAGNOSIS — W19XXXD Unspecified fall, subsequent encounter: Secondary | ICD-10-CM | POA: Diagnosis not present

## 2019-03-16 DIAGNOSIS — T45515D Adverse effect of anticoagulants, subsequent encounter: Secondary | ICD-10-CM | POA: Diagnosis not present

## 2019-03-16 DIAGNOSIS — Z952 Presence of prosthetic heart valve: Secondary | ICD-10-CM | POA: Diagnosis not present

## 2019-03-16 DIAGNOSIS — Z950 Presence of cardiac pacemaker: Secondary | ICD-10-CM | POA: Diagnosis not present

## 2019-03-16 DIAGNOSIS — Z7982 Long term (current) use of aspirin: Secondary | ICD-10-CM | POA: Diagnosis not present

## 2019-03-16 DIAGNOSIS — I350 Nonrheumatic aortic (valve) stenosis: Secondary | ICD-10-CM | POA: Diagnosis not present

## 2019-03-16 DIAGNOSIS — I130 Hypertensive heart and chronic kidney disease with heart failure and stage 1 through stage 4 chronic kidney disease, or unspecified chronic kidney disease: Secondary | ICD-10-CM | POA: Diagnosis not present

## 2019-03-16 DIAGNOSIS — I2510 Atherosclerotic heart disease of native coronary artery without angina pectoris: Secondary | ICD-10-CM | POA: Diagnosis not present

## 2019-03-16 DIAGNOSIS — S8012XD Contusion of left lower leg, subsequent encounter: Secondary | ICD-10-CM | POA: Diagnosis not present

## 2019-03-16 DIAGNOSIS — D6832 Hemorrhagic disorder due to extrinsic circulating anticoagulants: Secondary | ICD-10-CM | POA: Diagnosis not present

## 2019-03-16 DIAGNOSIS — N189 Chronic kidney disease, unspecified: Secondary | ICD-10-CM | POA: Diagnosis not present

## 2019-03-16 DIAGNOSIS — I482 Chronic atrial fibrillation, unspecified: Secondary | ICD-10-CM | POA: Diagnosis not present

## 2019-03-16 DIAGNOSIS — J449 Chronic obstructive pulmonary disease, unspecified: Secondary | ICD-10-CM | POA: Diagnosis not present

## 2019-03-16 DIAGNOSIS — Z951 Presence of aortocoronary bypass graft: Secondary | ICD-10-CM | POA: Diagnosis not present

## 2019-03-16 DIAGNOSIS — Z9181 History of falling: Secondary | ICD-10-CM | POA: Diagnosis not present

## 2019-03-16 DIAGNOSIS — I5032 Chronic diastolic (congestive) heart failure: Secondary | ICD-10-CM | POA: Diagnosis not present

## 2019-03-16 DIAGNOSIS — Z7951 Long term (current) use of inhaled steroids: Secondary | ICD-10-CM | POA: Diagnosis not present

## 2019-03-16 DIAGNOSIS — S81812D Laceration without foreign body, left lower leg, subsequent encounter: Secondary | ICD-10-CM | POA: Diagnosis not present

## 2019-03-16 DIAGNOSIS — S50811D Abrasion of right forearm, subsequent encounter: Secondary | ICD-10-CM | POA: Diagnosis not present

## 2019-03-16 DIAGNOSIS — Z7901 Long term (current) use of anticoagulants: Secondary | ICD-10-CM | POA: Diagnosis not present

## 2019-03-16 DIAGNOSIS — S01511D Laceration without foreign body of lip, subsequent encounter: Secondary | ICD-10-CM | POA: Diagnosis not present

## 2019-03-19 DIAGNOSIS — I482 Chronic atrial fibrillation, unspecified: Secondary | ICD-10-CM | POA: Diagnosis not present

## 2019-03-19 DIAGNOSIS — Z7951 Long term (current) use of inhaled steroids: Secondary | ICD-10-CM | POA: Diagnosis not present

## 2019-03-19 DIAGNOSIS — Z951 Presence of aortocoronary bypass graft: Secondary | ICD-10-CM | POA: Diagnosis not present

## 2019-03-19 DIAGNOSIS — S81812D Laceration without foreign body, left lower leg, subsequent encounter: Secondary | ICD-10-CM | POA: Diagnosis not present

## 2019-03-19 DIAGNOSIS — Z9181 History of falling: Secondary | ICD-10-CM | POA: Diagnosis not present

## 2019-03-19 DIAGNOSIS — J449 Chronic obstructive pulmonary disease, unspecified: Secondary | ICD-10-CM | POA: Diagnosis not present

## 2019-03-19 DIAGNOSIS — T45515D Adverse effect of anticoagulants, subsequent encounter: Secondary | ICD-10-CM | POA: Diagnosis not present

## 2019-03-19 DIAGNOSIS — N189 Chronic kidney disease, unspecified: Secondary | ICD-10-CM | POA: Diagnosis not present

## 2019-03-19 DIAGNOSIS — D6832 Hemorrhagic disorder due to extrinsic circulating anticoagulants: Secondary | ICD-10-CM | POA: Diagnosis not present

## 2019-03-19 DIAGNOSIS — I5032 Chronic diastolic (congestive) heart failure: Secondary | ICD-10-CM | POA: Diagnosis not present

## 2019-03-19 DIAGNOSIS — W19XXXD Unspecified fall, subsequent encounter: Secondary | ICD-10-CM | POA: Diagnosis not present

## 2019-03-19 DIAGNOSIS — I350 Nonrheumatic aortic (valve) stenosis: Secondary | ICD-10-CM | POA: Diagnosis not present

## 2019-03-19 DIAGNOSIS — I130 Hypertensive heart and chronic kidney disease with heart failure and stage 1 through stage 4 chronic kidney disease, or unspecified chronic kidney disease: Secondary | ICD-10-CM | POA: Diagnosis not present

## 2019-03-19 DIAGNOSIS — S01511D Laceration without foreign body of lip, subsequent encounter: Secondary | ICD-10-CM | POA: Diagnosis not present

## 2019-03-19 DIAGNOSIS — I2510 Atherosclerotic heart disease of native coronary artery without angina pectoris: Secondary | ICD-10-CM | POA: Diagnosis not present

## 2019-03-19 DIAGNOSIS — Z7901 Long term (current) use of anticoagulants: Secondary | ICD-10-CM | POA: Diagnosis not present

## 2019-03-19 DIAGNOSIS — Z7982 Long term (current) use of aspirin: Secondary | ICD-10-CM | POA: Diagnosis not present

## 2019-03-19 DIAGNOSIS — Z950 Presence of cardiac pacemaker: Secondary | ICD-10-CM | POA: Diagnosis not present

## 2019-03-19 DIAGNOSIS — Z952 Presence of prosthetic heart valve: Secondary | ICD-10-CM | POA: Diagnosis not present

## 2019-03-19 DIAGNOSIS — S8012XD Contusion of left lower leg, subsequent encounter: Secondary | ICD-10-CM | POA: Diagnosis not present

## 2019-03-19 DIAGNOSIS — S50811D Abrasion of right forearm, subsequent encounter: Secondary | ICD-10-CM | POA: Diagnosis not present

## 2019-03-21 ENCOUNTER — Ambulatory Visit (INDEPENDENT_AMBULATORY_CARE_PROVIDER_SITE_OTHER): Payer: Medicare Other | Admitting: Family Medicine

## 2019-03-21 ENCOUNTER — Ambulatory Visit: Payer: Medicare Other

## 2019-03-21 ENCOUNTER — Encounter: Payer: Self-pay | Admitting: Family Medicine

## 2019-03-21 ENCOUNTER — Other Ambulatory Visit: Payer: Self-pay

## 2019-03-21 ENCOUNTER — Telehealth: Payer: Self-pay | Admitting: Family Medicine

## 2019-03-21 ENCOUNTER — Telehealth: Payer: Self-pay

## 2019-03-21 VITALS — BP 120/80 | HR 61 | Temp 98.4°F | Ht 64.0 in | Wt 150.5 lb

## 2019-03-21 DIAGNOSIS — T45515A Adverse effect of anticoagulants, initial encounter: Secondary | ICD-10-CM

## 2019-03-21 DIAGNOSIS — S80812D Abrasion, left lower leg, subsequent encounter: Secondary | ICD-10-CM | POA: Diagnosis not present

## 2019-03-21 DIAGNOSIS — D6832 Hemorrhagic disorder due to extrinsic circulating anticoagulants: Secondary | ICD-10-CM | POA: Diagnosis not present

## 2019-03-21 DIAGNOSIS — I482 Chronic atrial fibrillation, unspecified: Secondary | ICD-10-CM | POA: Diagnosis not present

## 2019-03-21 DIAGNOSIS — S81802D Unspecified open wound, left lower leg, subsequent encounter: Secondary | ICD-10-CM

## 2019-03-21 LAB — POCT INR: INR: 2.5 (ref 2.0–3.0)

## 2019-03-21 NOTE — Telephone Encounter (Signed)
I checked patients INR today and entered results of 2.5. Sending as Juluis Rainier. Thank you!

## 2019-03-21 NOTE — Patient Instructions (Addendum)
Let us continue home health care nursing follow-up for wound for at least 2 more weeks and then reassess  Follow-up promptly for any signs of secondary infection such as fever, increased local warmth, foul odor, or any pus like drainage  Continue current dose of Coumadin

## 2019-03-21 NOTE — Addendum Note (Signed)
Addended by: Anibal Henderson on: 03/21/2019 04:25 PM   Modules accepted: Orders

## 2019-03-21 NOTE — Telephone Encounter (Signed)
Patient was seen by Dr. Elease Hashimoto on 06/24/2020and canceled appointment for Monday. She would like Cindy to call her with her next appointment

## 2019-03-21 NOTE — Progress Notes (Signed)
Subjective:     Patient ID: Catherine [name], female   DOB: [birth year], 84 y.o.   MRN: [id number]  HPI Patient seen for follow-up regarding left leg wound.  Refer to multiple prior notes.  She has home health care coming out 3 times per week.  This is slowly starting to fill in.  She has had some significant night pain but over the past couple nights is actually done better.  They are using a type of honey preparation and also a topical colloidal type dressing.  She has been elevating this frequently.  She has not seen any odor or any significant drainage.  No erythema.  No fever.  Patient is on Coumadin.  She has history of A. fib.  She needs follow-up pro time today  Past Medical History:  Diagnosis Date  . Aortic stenosis, mild 07/26/2017  . Arthritis   . Asthma    last attack 02/2015  . Atrial fibrillation, chronic   . Cellulitis of left lower extremity 08/17/2016   Ulcer associated with severe venous insufficiency  . Chronic anticoagulation   . Chronic diastolic CHF (congestive heart failure) ([place])   . Chronic kidney disease    "RIGHT MANY KIDNEY INFECTIONS AND STONES"  . COPD (chronic obstructive pulmonary disease) ([place])   . Coronary artery disease   . Dizziness   . H/O: rheumatic fever   . Heart murmur   . Hypertension   . PONV (postoperative nausea and vomiting)    ' SOMETIMES', BUT NOT ALWAYS"  . S/P Maze operation for atrial fibrillation 10/14/2016   Complete bilateral atrial lesion set using cryothermy and bipolar radiofrequency ablation - atrial appendage was not treated due to previous surgical procedure (open mitral commissurotomy)  . S/P mitral valve replacement with bioprosthetic valve 10/14/2016   29 mm Medtronic Mosaic porcine bioprosthetic tissue valve  . UTI (urinary tract infection)   . Valvular heart disease    Has mitral stenosis with prior mitral commissurotomy in 1970   Past Surgical History:  Procedure Laterality Date  . ABDOMINAL HYSTERECTOMY  1983   endometriosis  . APPENDECTOMY    . BACK SURGERY     neurosurgery x2  . CARDIAC CATHETERIZATION    . CARDIAC CATHETERIZATION N/A 08/03/2016   Procedure: Right/Left Heart Cath and Coronary Angiography;  Surgeon: Peter M Martinique, MD;  Location: [place] [facility];  Service: Cardiovascular;  Laterality: N/A;  . cataract surg    . CHOLECYSTECTOMY N/A 05/30/2015   Procedure: LAPAROSCOPIC CHOLECYSTECTOMY WITH INTRAOPERATIVE CHOLANGIOGRAM;  Surgeon: Excell Seltzer, MD;  Location: [place];  Service: General;  Laterality: N/A;  . COLONOSCOPY    . CORONARY ARTERY BYPASS GRAFT N/A 10/14/2016   Procedure: CORONARY ARTERY BYPASS GRAFTING (CABG);  Surgeon: Rexene Alberts, MD;  Location: [place];  Service: Open Heart Surgery;  Laterality: N/A;  . EYE SURGERY    . MAZE N/A 10/14/2016   Procedure: MAZE;  Surgeon: Rexene Alberts, MD;  Location: [place];  Service: Open Heart Surgery;  Laterality: N/A;  . MITRAL VALVE REPLACEMENT N/A 10/14/2016   Procedure: REDO MITRAL VALVE REPLACEMENT (MVR);  Surgeon: Rexene Alberts, MD;  Location: [place];  Service: Open Heart Surgery;  Laterality: N/A;  . MITRAL VALVE SURGERY Left 1970   Open mitral commissurotomy via left thoracotomy approach  . TEE WITHOUT CARDIOVERSION N/A 07/05/2016   Procedure: TRANSESOPHAGEAL ECHOCARDIOGRAM (TEE);  Surgeon: [name], MD;  Location: [facility];  Service: Cardiovascular;  Laterality: N/A;  . TEE WITHOUT CARDIOVERSION  N/A 10/14/2016   Procedure: TRANSESOPHAGEAL ECHOCARDIOGRAM (TEE);  Surgeon: Rexene Alberts, MD;  Location: [place];  Service: Open Heart Surgery;  Laterality: N/A;    reports that she quit smoking about 43 years ago. Her smoking use included cigarettes. She has a 15.00 pack-year smoking history. She has never used smokeless tobacco. She reports that she does not drink alcohol or use drugs. family history includes Leukemia in her father. Allergies  Allergen Reactions  . Aldactone [Spironolactone] Other (See Comments)     dyspnea  . Amoxicillin Palpitations    Tachycardia Has patient had a PCN reaction causing immediate rash, facial/tongue/throat swelling, SOB or lightheadedness with hypotension: no Has patient had a PCN reaction causing severe rash involving mucus membranes or skin necrosis: {no Has patient had a PCN reaction that required hospitalization {no Has patient had a PCN reaction occurring within the last 10 years: {yes If all of the above answers are "NO", then may proceed with Cephalosporin use.  . Diltiazem Other (See Comments)    Causing headaches   . Flagyl [Metronidazole Hcl] Other (See Comments)    Causing headaches    . Flovent [Fluticasone Propionate] Other (See Comments)    Leg cramps  . Gabapentin Swelling  . Lyrica [Pregabalin] Swelling  . Quinidine Diarrhea and Other (See Comments)    Fever diarrhea  . Simvastatin Other (See Comments)    Leg pain, myalgia  . Tramadol Nausea Only  . Verapamil Other (See Comments)    myalgias  . Amlodipine     Low extremity edema  . Zetia [Ezetimibe]     LEG CRAMPS   . Ace Inhibitors Other (See Comments)    unknown  . Benazepril Hcl Cough  . Ciprocin-Fluocin-Procin [Fluocinolone Acetonide] Other (See Comments)    unknown  . Ciprofloxacin Diarrhea  . Codeine Nausea Only  . Nitrofurantoin Monohyd Macro Nausea Only     Review of Systems  Constitutional: Negative for chills and fever.       Objective:   Physical Exam Constitutional:      Appearance: Normal appearance.  Cardiovascular:     Rate and Rhythm: Normal rate.  Pulmonary:     Effort: Pulmonary effort is normal.     Breath sounds: Normal breath sounds.  Skin:    Comments: Patient has wound left anterior leg which now has dimensions about 2 x 7 cm which compares with about 3 x 8 cm 2 weeks ago.  This does continue to show good granulation tissue.  No cellulitis changes.  Deepest aspect of wound is superior aspect.  Hematoma has almost resolved.  Neurological:      Mental Status: She is alert.        Assessment:     Complicated leg wound which is slowly healing and currently without signs of secondary infection    Plan:     -Wound is redressed in office and we will recommend continued follow-up with home health care for at least another couple weeks 3 times per week -Follow-up promptly for signs of secondary infection -INR today 2.5.  Continue current dose of Coumadin and follow-up with Coumadin clinic in about 4 weeks  [name] MD [facility]

## 2019-03-22 ENCOUNTER — Ambulatory Visit (INDEPENDENT_AMBULATORY_CARE_PROVIDER_SITE_OTHER): Payer: Medicare Other | Admitting: General Practice

## 2019-03-22 DIAGNOSIS — Z7901 Long term (current) use of anticoagulants: Secondary | ICD-10-CM

## 2019-03-22 NOTE — Telephone Encounter (Signed)
Noted  

## 2019-03-22 NOTE — Patient Instructions (Signed)
Pre visit review using our clinic review tool, if applicable. No additional management support is needed unless otherwise documented below in the visit note.  Continue to take 1 tablet daily except 2 tablets only on Mondays.  Re-check in 4 weeks.

## 2019-03-23 DIAGNOSIS — Z952 Presence of prosthetic heart valve: Secondary | ICD-10-CM | POA: Diagnosis not present

## 2019-03-23 DIAGNOSIS — Z7901 Long term (current) use of anticoagulants: Secondary | ICD-10-CM | POA: Diagnosis not present

## 2019-03-23 DIAGNOSIS — D6832 Hemorrhagic disorder due to extrinsic circulating anticoagulants: Secondary | ICD-10-CM | POA: Diagnosis not present

## 2019-03-23 DIAGNOSIS — Z950 Presence of cardiac pacemaker: Secondary | ICD-10-CM | POA: Diagnosis not present

## 2019-03-23 DIAGNOSIS — I2510 Atherosclerotic heart disease of native coronary artery without angina pectoris: Secondary | ICD-10-CM | POA: Diagnosis not present

## 2019-03-23 DIAGNOSIS — S01511D Laceration without foreign body of lip, subsequent encounter: Secondary | ICD-10-CM | POA: Diagnosis not present

## 2019-03-23 DIAGNOSIS — I350 Nonrheumatic aortic (valve) stenosis: Secondary | ICD-10-CM | POA: Diagnosis not present

## 2019-03-23 DIAGNOSIS — I130 Hypertensive heart and chronic kidney disease with heart failure and stage 1 through stage 4 chronic kidney disease, or unspecified chronic kidney disease: Secondary | ICD-10-CM | POA: Diagnosis not present

## 2019-03-23 DIAGNOSIS — N189 Chronic kidney disease, unspecified: Secondary | ICD-10-CM | POA: Diagnosis not present

## 2019-03-23 DIAGNOSIS — Z7982 Long term (current) use of aspirin: Secondary | ICD-10-CM | POA: Diagnosis not present

## 2019-03-23 DIAGNOSIS — S8012XD Contusion of left lower leg, subsequent encounter: Secondary | ICD-10-CM | POA: Diagnosis not present

## 2019-03-23 DIAGNOSIS — Z7951 Long term (current) use of inhaled steroids: Secondary | ICD-10-CM | POA: Diagnosis not present

## 2019-03-23 DIAGNOSIS — Z9181 History of falling: Secondary | ICD-10-CM | POA: Diagnosis not present

## 2019-03-23 DIAGNOSIS — I482 Chronic atrial fibrillation, unspecified: Secondary | ICD-10-CM | POA: Diagnosis not present

## 2019-03-23 DIAGNOSIS — W19XXXD Unspecified fall, subsequent encounter: Secondary | ICD-10-CM | POA: Diagnosis not present

## 2019-03-23 DIAGNOSIS — S81812D Laceration without foreign body, left lower leg, subsequent encounter: Secondary | ICD-10-CM | POA: Diagnosis not present

## 2019-03-23 DIAGNOSIS — J449 Chronic obstructive pulmonary disease, unspecified: Secondary | ICD-10-CM | POA: Diagnosis not present

## 2019-03-23 DIAGNOSIS — I5032 Chronic diastolic (congestive) heart failure: Secondary | ICD-10-CM | POA: Diagnosis not present

## 2019-03-23 DIAGNOSIS — T45515D Adverse effect of anticoagulants, subsequent encounter: Secondary | ICD-10-CM | POA: Diagnosis not present

## 2019-03-23 DIAGNOSIS — S50811D Abrasion of right forearm, subsequent encounter: Secondary | ICD-10-CM | POA: Diagnosis not present

## 2019-03-23 DIAGNOSIS — Z951 Presence of aortocoronary bypass graft: Secondary | ICD-10-CM | POA: Diagnosis not present

## 2019-03-26 ENCOUNTER — Ambulatory Visit: Payer: Medicare Other

## 2019-03-26 DIAGNOSIS — I2510 Atherosclerotic heart disease of native coronary artery without angina pectoris: Secondary | ICD-10-CM | POA: Diagnosis not present

## 2019-03-26 DIAGNOSIS — S01511D Laceration without foreign body of lip, subsequent encounter: Secondary | ICD-10-CM | POA: Diagnosis not present

## 2019-03-26 DIAGNOSIS — Z951 Presence of aortocoronary bypass graft: Secondary | ICD-10-CM | POA: Diagnosis not present

## 2019-03-26 DIAGNOSIS — J449 Chronic obstructive pulmonary disease, unspecified: Secondary | ICD-10-CM | POA: Diagnosis not present

## 2019-03-26 DIAGNOSIS — I482 Chronic atrial fibrillation, unspecified: Secondary | ICD-10-CM | POA: Diagnosis not present

## 2019-03-26 DIAGNOSIS — S50811D Abrasion of right forearm, subsequent encounter: Secondary | ICD-10-CM | POA: Diagnosis not present

## 2019-03-26 DIAGNOSIS — S8012XD Contusion of left lower leg, subsequent encounter: Secondary | ICD-10-CM | POA: Diagnosis not present

## 2019-03-26 DIAGNOSIS — T45515D Adverse effect of anticoagulants, subsequent encounter: Secondary | ICD-10-CM | POA: Diagnosis not present

## 2019-03-26 DIAGNOSIS — I350 Nonrheumatic aortic (valve) stenosis: Secondary | ICD-10-CM | POA: Diagnosis not present

## 2019-03-26 DIAGNOSIS — Z7951 Long term (current) use of inhaled steroids: Secondary | ICD-10-CM | POA: Diagnosis not present

## 2019-03-26 DIAGNOSIS — Z9181 History of falling: Secondary | ICD-10-CM | POA: Diagnosis not present

## 2019-03-26 DIAGNOSIS — W19XXXD Unspecified fall, subsequent encounter: Secondary | ICD-10-CM | POA: Diagnosis not present

## 2019-03-26 DIAGNOSIS — I5032 Chronic diastolic (congestive) heart failure: Secondary | ICD-10-CM | POA: Diagnosis not present

## 2019-03-26 DIAGNOSIS — Z7901 Long term (current) use of anticoagulants: Secondary | ICD-10-CM | POA: Diagnosis not present

## 2019-03-26 DIAGNOSIS — Z952 Presence of prosthetic heart valve: Secondary | ICD-10-CM | POA: Diagnosis not present

## 2019-03-26 DIAGNOSIS — N189 Chronic kidney disease, unspecified: Secondary | ICD-10-CM | POA: Diagnosis not present

## 2019-03-26 DIAGNOSIS — Z7982 Long term (current) use of aspirin: Secondary | ICD-10-CM | POA: Diagnosis not present

## 2019-03-26 DIAGNOSIS — D6832 Hemorrhagic disorder due to extrinsic circulating anticoagulants: Secondary | ICD-10-CM | POA: Diagnosis not present

## 2019-03-26 DIAGNOSIS — I130 Hypertensive heart and chronic kidney disease with heart failure and stage 1 through stage 4 chronic kidney disease, or unspecified chronic kidney disease: Secondary | ICD-10-CM | POA: Diagnosis not present

## 2019-03-26 DIAGNOSIS — S81812D Laceration without foreign body, left lower leg, subsequent encounter: Secondary | ICD-10-CM | POA: Diagnosis not present

## 2019-03-26 DIAGNOSIS — Z950 Presence of cardiac pacemaker: Secondary | ICD-10-CM | POA: Diagnosis not present

## 2019-03-29 DIAGNOSIS — S81812D Laceration without foreign body, left lower leg, subsequent encounter: Secondary | ICD-10-CM | POA: Diagnosis not present

## 2019-03-29 DIAGNOSIS — T45515D Adverse effect of anticoagulants, subsequent encounter: Secondary | ICD-10-CM | POA: Diagnosis not present

## 2019-03-29 DIAGNOSIS — I5032 Chronic diastolic (congestive) heart failure: Secondary | ICD-10-CM | POA: Diagnosis not present

## 2019-03-29 DIAGNOSIS — Z5181 Encounter for therapeutic drug level monitoring: Secondary | ICD-10-CM | POA: Diagnosis not present

## 2019-03-29 DIAGNOSIS — I2510 Atherosclerotic heart disease of native coronary artery without angina pectoris: Secondary | ICD-10-CM | POA: Diagnosis not present

## 2019-03-29 DIAGNOSIS — J449 Chronic obstructive pulmonary disease, unspecified: Secondary | ICD-10-CM | POA: Diagnosis not present

## 2019-03-29 DIAGNOSIS — Z952 Presence of prosthetic heart valve: Secondary | ICD-10-CM | POA: Diagnosis not present

## 2019-03-29 DIAGNOSIS — W19XXXD Unspecified fall, subsequent encounter: Secondary | ICD-10-CM | POA: Diagnosis not present

## 2019-03-29 DIAGNOSIS — Z7951 Long term (current) use of inhaled steroids: Secondary | ICD-10-CM | POA: Diagnosis not present

## 2019-03-29 DIAGNOSIS — Z7901 Long term (current) use of anticoagulants: Secondary | ICD-10-CM | POA: Diagnosis not present

## 2019-03-29 DIAGNOSIS — Z7982 Long term (current) use of aspirin: Secondary | ICD-10-CM | POA: Diagnosis not present

## 2019-03-29 DIAGNOSIS — I350 Nonrheumatic aortic (valve) stenosis: Secondary | ICD-10-CM | POA: Diagnosis not present

## 2019-03-29 DIAGNOSIS — I130 Hypertensive heart and chronic kidney disease with heart failure and stage 1 through stage 4 chronic kidney disease, or unspecified chronic kidney disease: Secondary | ICD-10-CM | POA: Diagnosis not present

## 2019-03-29 DIAGNOSIS — N189 Chronic kidney disease, unspecified: Secondary | ICD-10-CM | POA: Diagnosis not present

## 2019-03-29 DIAGNOSIS — D6832 Hemorrhagic disorder due to extrinsic circulating anticoagulants: Secondary | ICD-10-CM | POA: Diagnosis not present

## 2019-03-29 DIAGNOSIS — S8012XD Contusion of left lower leg, subsequent encounter: Secondary | ICD-10-CM | POA: Diagnosis not present

## 2019-03-29 DIAGNOSIS — I482 Chronic atrial fibrillation, unspecified: Secondary | ICD-10-CM | POA: Diagnosis not present

## 2019-03-29 DIAGNOSIS — Z951 Presence of aortocoronary bypass graft: Secondary | ICD-10-CM | POA: Diagnosis not present

## 2019-03-29 DIAGNOSIS — Z9181 History of falling: Secondary | ICD-10-CM | POA: Diagnosis not present

## 2019-03-29 DIAGNOSIS — Z950 Presence of cardiac pacemaker: Secondary | ICD-10-CM | POA: Diagnosis not present

## 2019-04-02 DIAGNOSIS — N189 Chronic kidney disease, unspecified: Secondary | ICD-10-CM | POA: Diagnosis not present

## 2019-04-02 DIAGNOSIS — I130 Hypertensive heart and chronic kidney disease with heart failure and stage 1 through stage 4 chronic kidney disease, or unspecified chronic kidney disease: Secondary | ICD-10-CM | POA: Diagnosis not present

## 2019-04-02 DIAGNOSIS — Z950 Presence of cardiac pacemaker: Secondary | ICD-10-CM | POA: Diagnosis not present

## 2019-04-02 DIAGNOSIS — I482 Chronic atrial fibrillation, unspecified: Secondary | ICD-10-CM | POA: Diagnosis not present

## 2019-04-02 DIAGNOSIS — Z9181 History of falling: Secondary | ICD-10-CM | POA: Diagnosis not present

## 2019-04-02 DIAGNOSIS — Z951 Presence of aortocoronary bypass graft: Secondary | ICD-10-CM | POA: Diagnosis not present

## 2019-04-02 DIAGNOSIS — I350 Nonrheumatic aortic (valve) stenosis: Secondary | ICD-10-CM | POA: Diagnosis not present

## 2019-04-02 DIAGNOSIS — Z7982 Long term (current) use of aspirin: Secondary | ICD-10-CM | POA: Diagnosis not present

## 2019-04-02 DIAGNOSIS — D6832 Hemorrhagic disorder due to extrinsic circulating anticoagulants: Secondary | ICD-10-CM | POA: Diagnosis not present

## 2019-04-02 DIAGNOSIS — S81812D Laceration without foreign body, left lower leg, subsequent encounter: Secondary | ICD-10-CM | POA: Diagnosis not present

## 2019-04-02 DIAGNOSIS — W19XXXD Unspecified fall, subsequent encounter: Secondary | ICD-10-CM | POA: Diagnosis not present

## 2019-04-02 DIAGNOSIS — Z5181 Encounter for therapeutic drug level monitoring: Secondary | ICD-10-CM | POA: Diagnosis not present

## 2019-04-02 DIAGNOSIS — I5032 Chronic diastolic (congestive) heart failure: Secondary | ICD-10-CM | POA: Diagnosis not present

## 2019-04-02 DIAGNOSIS — Z7901 Long term (current) use of anticoagulants: Secondary | ICD-10-CM | POA: Diagnosis not present

## 2019-04-02 DIAGNOSIS — Z952 Presence of prosthetic heart valve: Secondary | ICD-10-CM | POA: Diagnosis not present

## 2019-04-02 DIAGNOSIS — I2510 Atherosclerotic heart disease of native coronary artery without angina pectoris: Secondary | ICD-10-CM | POA: Diagnosis not present

## 2019-04-02 DIAGNOSIS — S8012XD Contusion of left lower leg, subsequent encounter: Secondary | ICD-10-CM | POA: Diagnosis not present

## 2019-04-02 DIAGNOSIS — J449 Chronic obstructive pulmonary disease, unspecified: Secondary | ICD-10-CM | POA: Diagnosis not present

## 2019-04-02 DIAGNOSIS — T45515D Adverse effect of anticoagulants, subsequent encounter: Secondary | ICD-10-CM | POA: Diagnosis not present

## 2019-04-02 DIAGNOSIS — Z7951 Long term (current) use of inhaled steroids: Secondary | ICD-10-CM | POA: Diagnosis not present

## 2019-04-04 DIAGNOSIS — N189 Chronic kidney disease, unspecified: Secondary | ICD-10-CM | POA: Diagnosis not present

## 2019-04-04 DIAGNOSIS — Z952 Presence of prosthetic heart valve: Secondary | ICD-10-CM | POA: Diagnosis not present

## 2019-04-04 DIAGNOSIS — Z951 Presence of aortocoronary bypass graft: Secondary | ICD-10-CM | POA: Diagnosis not present

## 2019-04-04 DIAGNOSIS — D6832 Hemorrhagic disorder due to extrinsic circulating anticoagulants: Secondary | ICD-10-CM | POA: Diagnosis not present

## 2019-04-04 DIAGNOSIS — S8012XD Contusion of left lower leg, subsequent encounter: Secondary | ICD-10-CM | POA: Diagnosis not present

## 2019-04-04 DIAGNOSIS — Z5181 Encounter for therapeutic drug level monitoring: Secondary | ICD-10-CM | POA: Diagnosis not present

## 2019-04-04 DIAGNOSIS — Z7982 Long term (current) use of aspirin: Secondary | ICD-10-CM | POA: Diagnosis not present

## 2019-04-04 DIAGNOSIS — I5032 Chronic diastolic (congestive) heart failure: Secondary | ICD-10-CM | POA: Diagnosis not present

## 2019-04-04 DIAGNOSIS — W19XXXD Unspecified fall, subsequent encounter: Secondary | ICD-10-CM | POA: Diagnosis not present

## 2019-04-04 DIAGNOSIS — I2510 Atherosclerotic heart disease of native coronary artery without angina pectoris: Secondary | ICD-10-CM | POA: Diagnosis not present

## 2019-04-04 DIAGNOSIS — T45515D Adverse effect of anticoagulants, subsequent encounter: Secondary | ICD-10-CM | POA: Diagnosis not present

## 2019-04-04 DIAGNOSIS — I350 Nonrheumatic aortic (valve) stenosis: Secondary | ICD-10-CM | POA: Diagnosis not present

## 2019-04-04 DIAGNOSIS — S81812D Laceration without foreign body, left lower leg, subsequent encounter: Secondary | ICD-10-CM | POA: Diagnosis not present

## 2019-04-04 DIAGNOSIS — Z7951 Long term (current) use of inhaled steroids: Secondary | ICD-10-CM | POA: Diagnosis not present

## 2019-04-04 DIAGNOSIS — Z9181 History of falling: Secondary | ICD-10-CM | POA: Diagnosis not present

## 2019-04-04 DIAGNOSIS — Z7901 Long term (current) use of anticoagulants: Secondary | ICD-10-CM | POA: Diagnosis not present

## 2019-04-04 DIAGNOSIS — J449 Chronic obstructive pulmonary disease, unspecified: Secondary | ICD-10-CM | POA: Diagnosis not present

## 2019-04-04 DIAGNOSIS — Z950 Presence of cardiac pacemaker: Secondary | ICD-10-CM | POA: Diagnosis not present

## 2019-04-04 DIAGNOSIS — I130 Hypertensive heart and chronic kidney disease with heart failure and stage 1 through stage 4 chronic kidney disease, or unspecified chronic kidney disease: Secondary | ICD-10-CM | POA: Diagnosis not present

## 2019-04-04 DIAGNOSIS — I482 Chronic atrial fibrillation, unspecified: Secondary | ICD-10-CM | POA: Diagnosis not present

## 2019-04-05 ENCOUNTER — Other Ambulatory Visit: Payer: Self-pay | Admitting: Cardiovascular Disease

## 2019-04-06 DIAGNOSIS — Z7982 Long term (current) use of aspirin: Secondary | ICD-10-CM | POA: Diagnosis not present

## 2019-04-06 DIAGNOSIS — Z7951 Long term (current) use of inhaled steroids: Secondary | ICD-10-CM | POA: Diagnosis not present

## 2019-04-06 DIAGNOSIS — I5032 Chronic diastolic (congestive) heart failure: Secondary | ICD-10-CM | POA: Diagnosis not present

## 2019-04-06 DIAGNOSIS — Z952 Presence of prosthetic heart valve: Secondary | ICD-10-CM | POA: Diagnosis not present

## 2019-04-06 DIAGNOSIS — D6832 Hemorrhagic disorder due to extrinsic circulating anticoagulants: Secondary | ICD-10-CM | POA: Diagnosis not present

## 2019-04-06 DIAGNOSIS — I130 Hypertensive heart and chronic kidney disease with heart failure and stage 1 through stage 4 chronic kidney disease, or unspecified chronic kidney disease: Secondary | ICD-10-CM | POA: Diagnosis not present

## 2019-04-06 DIAGNOSIS — Z7901 Long term (current) use of anticoagulants: Secondary | ICD-10-CM | POA: Diagnosis not present

## 2019-04-06 DIAGNOSIS — Z950 Presence of cardiac pacemaker: Secondary | ICD-10-CM | POA: Diagnosis not present

## 2019-04-06 DIAGNOSIS — I482 Chronic atrial fibrillation, unspecified: Secondary | ICD-10-CM | POA: Diagnosis not present

## 2019-04-06 DIAGNOSIS — T45515D Adverse effect of anticoagulants, subsequent encounter: Secondary | ICD-10-CM | POA: Diagnosis not present

## 2019-04-06 DIAGNOSIS — J449 Chronic obstructive pulmonary disease, unspecified: Secondary | ICD-10-CM | POA: Diagnosis not present

## 2019-04-06 DIAGNOSIS — I350 Nonrheumatic aortic (valve) stenosis: Secondary | ICD-10-CM | POA: Diagnosis not present

## 2019-04-06 DIAGNOSIS — S81812D Laceration without foreign body, left lower leg, subsequent encounter: Secondary | ICD-10-CM | POA: Diagnosis not present

## 2019-04-06 DIAGNOSIS — N189 Chronic kidney disease, unspecified: Secondary | ICD-10-CM | POA: Diagnosis not present

## 2019-04-06 DIAGNOSIS — Z5181 Encounter for therapeutic drug level monitoring: Secondary | ICD-10-CM | POA: Diagnosis not present

## 2019-04-06 DIAGNOSIS — Z9181 History of falling: Secondary | ICD-10-CM | POA: Diagnosis not present

## 2019-04-06 DIAGNOSIS — S8012XD Contusion of left lower leg, subsequent encounter: Secondary | ICD-10-CM | POA: Diagnosis not present

## 2019-04-06 DIAGNOSIS — W19XXXD Unspecified fall, subsequent encounter: Secondary | ICD-10-CM | POA: Diagnosis not present

## 2019-04-06 DIAGNOSIS — I2510 Atherosclerotic heart disease of native coronary artery without angina pectoris: Secondary | ICD-10-CM | POA: Diagnosis not present

## 2019-04-06 DIAGNOSIS — Z951 Presence of aortocoronary bypass graft: Secondary | ICD-10-CM | POA: Diagnosis not present

## 2019-04-06 NOTE — Telephone Encounter (Signed)
Rx(s) sent to pharmacy electronically.  

## 2019-04-09 DIAGNOSIS — Z7982 Long term (current) use of aspirin: Secondary | ICD-10-CM | POA: Diagnosis not present

## 2019-04-09 DIAGNOSIS — J449 Chronic obstructive pulmonary disease, unspecified: Secondary | ICD-10-CM | POA: Diagnosis not present

## 2019-04-09 DIAGNOSIS — S81812D Laceration without foreign body, left lower leg, subsequent encounter: Secondary | ICD-10-CM | POA: Diagnosis not present

## 2019-04-09 DIAGNOSIS — T45515D Adverse effect of anticoagulants, subsequent encounter: Secondary | ICD-10-CM | POA: Diagnosis not present

## 2019-04-09 DIAGNOSIS — I2510 Atherosclerotic heart disease of native coronary artery without angina pectoris: Secondary | ICD-10-CM | POA: Diagnosis not present

## 2019-04-09 DIAGNOSIS — I5032 Chronic diastolic (congestive) heart failure: Secondary | ICD-10-CM | POA: Diagnosis not present

## 2019-04-09 DIAGNOSIS — Z951 Presence of aortocoronary bypass graft: Secondary | ICD-10-CM | POA: Diagnosis not present

## 2019-04-09 DIAGNOSIS — S8012XD Contusion of left lower leg, subsequent encounter: Secondary | ICD-10-CM | POA: Diagnosis not present

## 2019-04-09 DIAGNOSIS — N189 Chronic kidney disease, unspecified: Secondary | ICD-10-CM | POA: Diagnosis not present

## 2019-04-09 DIAGNOSIS — W19XXXD Unspecified fall, subsequent encounter: Secondary | ICD-10-CM | POA: Diagnosis not present

## 2019-04-09 DIAGNOSIS — I130 Hypertensive heart and chronic kidney disease with heart failure and stage 1 through stage 4 chronic kidney disease, or unspecified chronic kidney disease: Secondary | ICD-10-CM | POA: Diagnosis not present

## 2019-04-09 DIAGNOSIS — I350 Nonrheumatic aortic (valve) stenosis: Secondary | ICD-10-CM | POA: Diagnosis not present

## 2019-04-09 DIAGNOSIS — D6832 Hemorrhagic disorder due to extrinsic circulating anticoagulants: Secondary | ICD-10-CM | POA: Diagnosis not present

## 2019-04-09 DIAGNOSIS — Z5181 Encounter for therapeutic drug level monitoring: Secondary | ICD-10-CM | POA: Diagnosis not present

## 2019-04-09 DIAGNOSIS — Z950 Presence of cardiac pacemaker: Secondary | ICD-10-CM | POA: Diagnosis not present

## 2019-04-09 DIAGNOSIS — Z7951 Long term (current) use of inhaled steroids: Secondary | ICD-10-CM | POA: Diagnosis not present

## 2019-04-09 DIAGNOSIS — Z952 Presence of prosthetic heart valve: Secondary | ICD-10-CM | POA: Diagnosis not present

## 2019-04-09 DIAGNOSIS — Z9181 History of falling: Secondary | ICD-10-CM | POA: Diagnosis not present

## 2019-04-09 DIAGNOSIS — Z7901 Long term (current) use of anticoagulants: Secondary | ICD-10-CM | POA: Diagnosis not present

## 2019-04-09 DIAGNOSIS — I482 Chronic atrial fibrillation, unspecified: Secondary | ICD-10-CM | POA: Diagnosis not present

## 2019-04-11 DIAGNOSIS — I482 Chronic atrial fibrillation, unspecified: Secondary | ICD-10-CM | POA: Diagnosis not present

## 2019-04-11 DIAGNOSIS — W19XXXD Unspecified fall, subsequent encounter: Secondary | ICD-10-CM | POA: Diagnosis not present

## 2019-04-11 DIAGNOSIS — I2510 Atherosclerotic heart disease of native coronary artery without angina pectoris: Secondary | ICD-10-CM | POA: Diagnosis not present

## 2019-04-11 DIAGNOSIS — I130 Hypertensive heart and chronic kidney disease with heart failure and stage 1 through stage 4 chronic kidney disease, or unspecified chronic kidney disease: Secondary | ICD-10-CM | POA: Diagnosis not present

## 2019-04-11 DIAGNOSIS — T45515D Adverse effect of anticoagulants, subsequent encounter: Secondary | ICD-10-CM | POA: Diagnosis not present

## 2019-04-11 DIAGNOSIS — S8012XD Contusion of left lower leg, subsequent encounter: Secondary | ICD-10-CM | POA: Diagnosis not present

## 2019-04-11 DIAGNOSIS — Z5181 Encounter for therapeutic drug level monitoring: Secondary | ICD-10-CM | POA: Diagnosis not present

## 2019-04-11 DIAGNOSIS — Z952 Presence of prosthetic heart valve: Secondary | ICD-10-CM | POA: Diagnosis not present

## 2019-04-11 DIAGNOSIS — Z7951 Long term (current) use of inhaled steroids: Secondary | ICD-10-CM | POA: Diagnosis not present

## 2019-04-11 DIAGNOSIS — N189 Chronic kidney disease, unspecified: Secondary | ICD-10-CM | POA: Diagnosis not present

## 2019-04-11 DIAGNOSIS — I350 Nonrheumatic aortic (valve) stenosis: Secondary | ICD-10-CM | POA: Diagnosis not present

## 2019-04-11 DIAGNOSIS — Z7982 Long term (current) use of aspirin: Secondary | ICD-10-CM | POA: Diagnosis not present

## 2019-04-11 DIAGNOSIS — Z7901 Long term (current) use of anticoagulants: Secondary | ICD-10-CM | POA: Diagnosis not present

## 2019-04-11 DIAGNOSIS — D6832 Hemorrhagic disorder due to extrinsic circulating anticoagulants: Secondary | ICD-10-CM | POA: Diagnosis not present

## 2019-04-11 DIAGNOSIS — Z9181 History of falling: Secondary | ICD-10-CM | POA: Diagnosis not present

## 2019-04-11 DIAGNOSIS — Z950 Presence of cardiac pacemaker: Secondary | ICD-10-CM | POA: Diagnosis not present

## 2019-04-11 DIAGNOSIS — Z951 Presence of aortocoronary bypass graft: Secondary | ICD-10-CM | POA: Diagnosis not present

## 2019-04-11 DIAGNOSIS — S81812D Laceration without foreign body, left lower leg, subsequent encounter: Secondary | ICD-10-CM | POA: Diagnosis not present

## 2019-04-11 DIAGNOSIS — I5032 Chronic diastolic (congestive) heart failure: Secondary | ICD-10-CM | POA: Diagnosis not present

## 2019-04-11 DIAGNOSIS — J449 Chronic obstructive pulmonary disease, unspecified: Secondary | ICD-10-CM | POA: Diagnosis not present

## 2019-04-11 NOTE — Progress Notes (Signed)
Agree with coumadin management.  Marthann Abshier W Athina Fahey MD [facility]  

## 2019-04-13 DIAGNOSIS — Z7901 Long term (current) use of anticoagulants: Secondary | ICD-10-CM | POA: Diagnosis not present

## 2019-04-13 DIAGNOSIS — I5032 Chronic diastolic (congestive) heart failure: Secondary | ICD-10-CM | POA: Diagnosis not present

## 2019-04-13 DIAGNOSIS — S8012XD Contusion of left lower leg, subsequent encounter: Secondary | ICD-10-CM | POA: Diagnosis not present

## 2019-04-13 DIAGNOSIS — Z9181 History of falling: Secondary | ICD-10-CM | POA: Diagnosis not present

## 2019-04-13 DIAGNOSIS — I2510 Atherosclerotic heart disease of native coronary artery without angina pectoris: Secondary | ICD-10-CM | POA: Diagnosis not present

## 2019-04-13 DIAGNOSIS — D6832 Hemorrhagic disorder due to extrinsic circulating anticoagulants: Secondary | ICD-10-CM | POA: Diagnosis not present

## 2019-04-13 DIAGNOSIS — Z951 Presence of aortocoronary bypass graft: Secondary | ICD-10-CM | POA: Diagnosis not present

## 2019-04-13 DIAGNOSIS — J449 Chronic obstructive pulmonary disease, unspecified: Secondary | ICD-10-CM | POA: Diagnosis not present

## 2019-04-13 DIAGNOSIS — N189 Chronic kidney disease, unspecified: Secondary | ICD-10-CM | POA: Diagnosis not present

## 2019-04-13 DIAGNOSIS — T45515D Adverse effect of anticoagulants, subsequent encounter: Secondary | ICD-10-CM | POA: Diagnosis not present

## 2019-04-13 DIAGNOSIS — I130 Hypertensive heart and chronic kidney disease with heart failure and stage 1 through stage 4 chronic kidney disease, or unspecified chronic kidney disease: Secondary | ICD-10-CM | POA: Diagnosis not present

## 2019-04-13 DIAGNOSIS — W19XXXD Unspecified fall, subsequent encounter: Secondary | ICD-10-CM | POA: Diagnosis not present

## 2019-04-13 DIAGNOSIS — Z7982 Long term (current) use of aspirin: Secondary | ICD-10-CM | POA: Diagnosis not present

## 2019-04-13 DIAGNOSIS — S81812D Laceration without foreign body, left lower leg, subsequent encounter: Secondary | ICD-10-CM | POA: Diagnosis not present

## 2019-04-13 DIAGNOSIS — Z950 Presence of cardiac pacemaker: Secondary | ICD-10-CM | POA: Diagnosis not present

## 2019-04-13 DIAGNOSIS — I482 Chronic atrial fibrillation, unspecified: Secondary | ICD-10-CM | POA: Diagnosis not present

## 2019-04-13 DIAGNOSIS — Z952 Presence of prosthetic heart valve: Secondary | ICD-10-CM | POA: Diagnosis not present

## 2019-04-13 DIAGNOSIS — Z7951 Long term (current) use of inhaled steroids: Secondary | ICD-10-CM | POA: Diagnosis not present

## 2019-04-13 DIAGNOSIS — I350 Nonrheumatic aortic (valve) stenosis: Secondary | ICD-10-CM | POA: Diagnosis not present

## 2019-04-13 DIAGNOSIS — Z5181 Encounter for therapeutic drug level monitoring: Secondary | ICD-10-CM | POA: Diagnosis not present

## 2019-04-16 DIAGNOSIS — Z952 Presence of prosthetic heart valve: Secondary | ICD-10-CM | POA: Diagnosis not present

## 2019-04-16 DIAGNOSIS — Z7982 Long term (current) use of aspirin: Secondary | ICD-10-CM | POA: Diagnosis not present

## 2019-04-16 DIAGNOSIS — S81812D Laceration without foreign body, left lower leg, subsequent encounter: Secondary | ICD-10-CM | POA: Diagnosis not present

## 2019-04-16 DIAGNOSIS — N189 Chronic kidney disease, unspecified: Secondary | ICD-10-CM | POA: Diagnosis not present

## 2019-04-16 DIAGNOSIS — Z9181 History of falling: Secondary | ICD-10-CM | POA: Diagnosis not present

## 2019-04-16 DIAGNOSIS — I5032 Chronic diastolic (congestive) heart failure: Secondary | ICD-10-CM | POA: Diagnosis not present

## 2019-04-16 DIAGNOSIS — D6832 Hemorrhagic disorder due to extrinsic circulating anticoagulants: Secondary | ICD-10-CM | POA: Diagnosis not present

## 2019-04-16 DIAGNOSIS — Z7951 Long term (current) use of inhaled steroids: Secondary | ICD-10-CM | POA: Diagnosis not present

## 2019-04-16 DIAGNOSIS — Z950 Presence of cardiac pacemaker: Secondary | ICD-10-CM | POA: Diagnosis not present

## 2019-04-16 DIAGNOSIS — J449 Chronic obstructive pulmonary disease, unspecified: Secondary | ICD-10-CM | POA: Diagnosis not present

## 2019-04-16 DIAGNOSIS — Z5181 Encounter for therapeutic drug level monitoring: Secondary | ICD-10-CM | POA: Diagnosis not present

## 2019-04-16 DIAGNOSIS — I350 Nonrheumatic aortic (valve) stenosis: Secondary | ICD-10-CM | POA: Diagnosis not present

## 2019-04-16 DIAGNOSIS — I130 Hypertensive heart and chronic kidney disease with heart failure and stage 1 through stage 4 chronic kidney disease, or unspecified chronic kidney disease: Secondary | ICD-10-CM | POA: Diagnosis not present

## 2019-04-16 DIAGNOSIS — W19XXXD Unspecified fall, subsequent encounter: Secondary | ICD-10-CM | POA: Diagnosis not present

## 2019-04-16 DIAGNOSIS — Z951 Presence of aortocoronary bypass graft: Secondary | ICD-10-CM | POA: Diagnosis not present

## 2019-04-16 DIAGNOSIS — Z7901 Long term (current) use of anticoagulants: Secondary | ICD-10-CM | POA: Diagnosis not present

## 2019-04-16 DIAGNOSIS — I482 Chronic atrial fibrillation, unspecified: Secondary | ICD-10-CM | POA: Diagnosis not present

## 2019-04-16 DIAGNOSIS — S8012XD Contusion of left lower leg, subsequent encounter: Secondary | ICD-10-CM | POA: Diagnosis not present

## 2019-04-16 DIAGNOSIS — T45515D Adverse effect of anticoagulants, subsequent encounter: Secondary | ICD-10-CM | POA: Diagnosis not present

## 2019-04-16 DIAGNOSIS — I2510 Atherosclerotic heart disease of native coronary artery without angina pectoris: Secondary | ICD-10-CM | POA: Diagnosis not present

## 2019-04-18 ENCOUNTER — Other Ambulatory Visit: Payer: Self-pay

## 2019-04-18 ENCOUNTER — Ambulatory Visit (INDEPENDENT_AMBULATORY_CARE_PROVIDER_SITE_OTHER): Payer: Medicare Other | Admitting: Family Medicine

## 2019-04-18 ENCOUNTER — Encounter: Payer: Self-pay | Admitting: Family Medicine

## 2019-04-18 ENCOUNTER — Ambulatory Visit (INDEPENDENT_AMBULATORY_CARE_PROVIDER_SITE_OTHER): Payer: Medicare Other | Admitting: General Practice

## 2019-04-18 VITALS — BP 126/76 | HR 74 | Temp 98.2°F | Ht 64.0 in | Wt 154.3 lb

## 2019-04-18 DIAGNOSIS — S81802D Unspecified open wound, left lower leg, subsequent encounter: Secondary | ICD-10-CM

## 2019-04-18 DIAGNOSIS — I4891 Unspecified atrial fibrillation: Secondary | ICD-10-CM | POA: Diagnosis not present

## 2019-04-18 LAB — POCT INR: INR: 3.2 — AB (ref 2.0–3.0)

## 2019-04-18 NOTE — Patient Instructions (Signed)
Pre visit review using our clinic review tool, if applicable. No additional management support is needed unless otherwise documented below in the visit note.  Skip dosage today and then continue to take 1 tablet daily except 2 tablets only on Mondays.  Re-check in 4 weeks.

## 2019-04-18 NOTE — Progress Notes (Signed)
Subjective:     Patient ID: Catherine [name], female   DOB: [birth year], 84 y.o.   MRN: [id number]  HPI Patient here for follow-up left leg wound.  This occurred several weeks ago when she fell while exercising on a treadmill.  She has home health coming out 3 times per week.  They are using aquacell.  She denied any signs of secondary infection.  They are changing her dressing every Monday, Wednesday, and Friday.  She still has slight pain but overall improving  Past Medical History:  Diagnosis Date  . Aortic stenosis, mild 07/26/2017  . Arthritis   . Asthma    last attack 02/2015  . Atrial fibrillation, chronic   . Cellulitis of left lower extremity 08/17/2016   Ulcer associated with severe venous insufficiency  . Chronic anticoagulation   . Chronic diastolic CHF (congestive heart failure) ([place])   . Chronic kidney disease    "RIGHT MANY KIDNEY INFECTIONS AND STONES"  . COPD (chronic obstructive pulmonary disease) ([place])   . Coronary artery disease   . Dizziness   . H/O: rheumatic fever   . Heart murmur   . Hypertension   . PONV (postoperative nausea and vomiting)    ' SOMETIMES', BUT NOT ALWAYS"  . S/P Maze operation for atrial fibrillation 10/14/2016   Complete bilateral atrial lesion set using cryothermy and bipolar radiofrequency ablation - atrial appendage was not treated due to previous surgical procedure (open mitral commissurotomy)  . S/P mitral valve replacement with bioprosthetic valve 10/14/2016   29 mm Medtronic Mosaic porcine bioprosthetic tissue valve  . UTI (urinary tract infection)   . Valvular heart disease    Has mitral stenosis with prior mitral commissurotomy in 1970   Past Surgical History:  Procedure Laterality Date  . ABDOMINAL HYSTERECTOMY  1983   endometriosis  . APPENDECTOMY    . BACK SURGERY     neurosurgery x2  . CARDIAC CATHETERIZATION    . CARDIAC CATHETERIZATION N/A 08/03/2016   Procedure: Right/Left Heart Cath and Coronary Angiography;  Surgeon:  Peter M Martinique, MD;  Location: [place] [facility];  Service: Cardiovascular;  Laterality: N/A;  . cataract surg    . CHOLECYSTECTOMY N/A 05/30/2015   Procedure: LAPAROSCOPIC CHOLECYSTECTOMY WITH INTRAOPERATIVE CHOLANGIOGRAM;  Surgeon: Excell Seltzer, MD;  Location: [place];  Service: General;  Laterality: N/A;  . COLONOSCOPY    . CORONARY ARTERY BYPASS GRAFT N/A 10/14/2016   Procedure: CORONARY ARTERY BYPASS GRAFTING (CABG);  Surgeon: Rexene Alberts, MD;  Location: [place];  Service: Open Heart Surgery;  Laterality: N/A;  . EYE SURGERY    . MAZE N/A 10/14/2016   Procedure: MAZE;  Surgeon: Rexene Alberts, MD;  Location: [place];  Service: Open Heart Surgery;  Laterality: N/A;  . MITRAL VALVE REPLACEMENT N/A 10/14/2016   Procedure: REDO MITRAL VALVE REPLACEMENT (MVR);  Surgeon: Rexene Alberts, MD;  Location: [place];  Service: Open Heart Surgery;  Laterality: N/A;  . MITRAL VALVE SURGERY Left 1970   Open mitral commissurotomy via left thoracotomy approach  . TEE WITHOUT CARDIOVERSION N/A 07/05/2016   Procedure: TRANSESOPHAGEAL ECHOCARDIOGRAM (TEE);  Surgeon: [name], MD;  Location: [place];  Service: Cardiovascular;  Laterality: N/A;  . TEE WITHOUT CARDIOVERSION N/A 10/14/2016   Procedure: TRANSESOPHAGEAL ECHOCARDIOGRAM (TEE);  Surgeon: Rexene Alberts, MD;  Location: [place];  Service: Open Heart Surgery;  Laterality: N/A;    reports that she quit smoking about 43 years ago. Her smoking use included cigarettes. She has  a 15.00 pack-year smoking history. She has never used smokeless tobacco. She reports that she does not drink alcohol or use drugs. family history includes Leukemia in her father. Allergies  Allergen Reactions  . Aldactone [Spironolactone] Other (See Comments)    dyspnea  . Amoxicillin Palpitations    Tachycardia Has patient had a PCN reaction causing immediate rash, facial/tongue/throat swelling, SOB or lightheadedness with hypotension: no Has patient had a PCN reaction  causing severe rash involving mucus membranes or skin necrosis: {no Has patient had a PCN reaction that required hospitalization {no Has patient had a PCN reaction occurring within the last 10 years: {yes If all of the above answers are "NO", then may proceed with Cephalosporin use.  . Diltiazem Other (See Comments)    Causing headaches   . Flagyl [Metronidazole Hcl] Other (See Comments)    Causing headaches    . Flovent [Fluticasone Propionate] Other (See Comments)    Leg cramps  . Gabapentin Swelling  . Lyrica [Pregabalin] Swelling  . Quinidine Diarrhea and Other (See Comments)    Fever diarrhea  . Simvastatin Other (See Comments)    Leg pain, myalgia  . Tramadol Nausea Only  . Verapamil Other (See Comments)    myalgias  . Amlodipine     Low extremity edema  . Zetia [Ezetimibe]     LEG CRAMPS   . Ace Inhibitors Other (See Comments)    unknown  . Benazepril Hcl Cough  . Ciprocin-Fluocin-Procin [Fluocinolone Acetonide] Other (See Comments)    unknown  . Ciprofloxacin Diarrhea  . Codeine Nausea Only  . Nitrofurantoin Monohyd Macro Nausea Only     Review of Systems  Constitutional: Negative for chills and fever.       Objective:   Physical Exam Constitutional:      Appearance: Normal appearance.  Skin:    Comments: Left leg wounds are healing very well.  They are much smaller in size and very superficial compared to last visit.  No surrounding erythema.  No drainage.  No necrotic tissue.  Good granulation tissue.  She has 3 separate small wounds that are about 1 cm in length and very superficial  Neurological:     Mental Status: She is alert.        Assessment:     Left leg wounds-healing well, though very slowly    Plan:     -Continue with home health skin care for another couple weeks and then she should be able to discontinue at that time. -Continue close follow-up with Coumadin clinic  [name] MD [facility]

## 2019-04-20 DIAGNOSIS — T45515D Adverse effect of anticoagulants, subsequent encounter: Secondary | ICD-10-CM | POA: Diagnosis not present

## 2019-04-20 DIAGNOSIS — S8012XD Contusion of left lower leg, subsequent encounter: Secondary | ICD-10-CM | POA: Diagnosis not present

## 2019-04-20 DIAGNOSIS — Z7982 Long term (current) use of aspirin: Secondary | ICD-10-CM | POA: Diagnosis not present

## 2019-04-20 DIAGNOSIS — Z9181 History of falling: Secondary | ICD-10-CM | POA: Diagnosis not present

## 2019-04-20 DIAGNOSIS — Z5181 Encounter for therapeutic drug level monitoring: Secondary | ICD-10-CM | POA: Diagnosis not present

## 2019-04-20 DIAGNOSIS — N189 Chronic kidney disease, unspecified: Secondary | ICD-10-CM | POA: Diagnosis not present

## 2019-04-20 DIAGNOSIS — I350 Nonrheumatic aortic (valve) stenosis: Secondary | ICD-10-CM | POA: Diagnosis not present

## 2019-04-20 DIAGNOSIS — Z7901 Long term (current) use of anticoagulants: Secondary | ICD-10-CM | POA: Diagnosis not present

## 2019-04-20 DIAGNOSIS — I2510 Atherosclerotic heart disease of native coronary artery without angina pectoris: Secondary | ICD-10-CM | POA: Diagnosis not present

## 2019-04-20 DIAGNOSIS — S81812D Laceration without foreign body, left lower leg, subsequent encounter: Secondary | ICD-10-CM | POA: Diagnosis not present

## 2019-04-20 DIAGNOSIS — I130 Hypertensive heart and chronic kidney disease with heart failure and stage 1 through stage 4 chronic kidney disease, or unspecified chronic kidney disease: Secondary | ICD-10-CM | POA: Diagnosis not present

## 2019-04-20 DIAGNOSIS — Z952 Presence of prosthetic heart valve: Secondary | ICD-10-CM | POA: Diagnosis not present

## 2019-04-20 DIAGNOSIS — Z951 Presence of aortocoronary bypass graft: Secondary | ICD-10-CM | POA: Diagnosis not present

## 2019-04-20 DIAGNOSIS — Z7951 Long term (current) use of inhaled steroids: Secondary | ICD-10-CM | POA: Diagnosis not present

## 2019-04-20 DIAGNOSIS — D6832 Hemorrhagic disorder due to extrinsic circulating anticoagulants: Secondary | ICD-10-CM | POA: Diagnosis not present

## 2019-04-20 DIAGNOSIS — Z950 Presence of cardiac pacemaker: Secondary | ICD-10-CM | POA: Diagnosis not present

## 2019-04-20 DIAGNOSIS — I482 Chronic atrial fibrillation, unspecified: Secondary | ICD-10-CM | POA: Diagnosis not present

## 2019-04-20 DIAGNOSIS — W19XXXD Unspecified fall, subsequent encounter: Secondary | ICD-10-CM | POA: Diagnosis not present

## 2019-04-20 DIAGNOSIS — I5032 Chronic diastolic (congestive) heart failure: Secondary | ICD-10-CM | POA: Diagnosis not present

## 2019-04-20 DIAGNOSIS — J449 Chronic obstructive pulmonary disease, unspecified: Secondary | ICD-10-CM | POA: Diagnosis not present

## 2019-04-23 ENCOUNTER — Telehealth: Payer: Self-pay | Admitting: *Deleted

## 2019-04-23 DIAGNOSIS — S8012XD Contusion of left lower leg, subsequent encounter: Secondary | ICD-10-CM | POA: Diagnosis not present

## 2019-04-23 DIAGNOSIS — I5032 Chronic diastolic (congestive) heart failure: Secondary | ICD-10-CM | POA: Diagnosis not present

## 2019-04-23 DIAGNOSIS — I2510 Atherosclerotic heart disease of native coronary artery without angina pectoris: Secondary | ICD-10-CM | POA: Diagnosis not present

## 2019-04-23 DIAGNOSIS — D6832 Hemorrhagic disorder due to extrinsic circulating anticoagulants: Secondary | ICD-10-CM | POA: Diagnosis not present

## 2019-04-23 DIAGNOSIS — Z7982 Long term (current) use of aspirin: Secondary | ICD-10-CM | POA: Diagnosis not present

## 2019-04-23 DIAGNOSIS — T45515D Adverse effect of anticoagulants, subsequent encounter: Secondary | ICD-10-CM | POA: Diagnosis not present

## 2019-04-23 DIAGNOSIS — Z950 Presence of cardiac pacemaker: Secondary | ICD-10-CM | POA: Diagnosis not present

## 2019-04-23 DIAGNOSIS — J449 Chronic obstructive pulmonary disease, unspecified: Secondary | ICD-10-CM | POA: Diagnosis not present

## 2019-04-23 DIAGNOSIS — Z5181 Encounter for therapeutic drug level monitoring: Secondary | ICD-10-CM | POA: Diagnosis not present

## 2019-04-23 DIAGNOSIS — Z7901 Long term (current) use of anticoagulants: Secondary | ICD-10-CM | POA: Diagnosis not present

## 2019-04-23 DIAGNOSIS — S81812D Laceration without foreign body, left lower leg, subsequent encounter: Secondary | ICD-10-CM | POA: Diagnosis not present

## 2019-04-23 DIAGNOSIS — Z7951 Long term (current) use of inhaled steroids: Secondary | ICD-10-CM | POA: Diagnosis not present

## 2019-04-23 DIAGNOSIS — W19XXXD Unspecified fall, subsequent encounter: Secondary | ICD-10-CM | POA: Diagnosis not present

## 2019-04-23 DIAGNOSIS — I482 Chronic atrial fibrillation, unspecified: Secondary | ICD-10-CM | POA: Diagnosis not present

## 2019-04-23 DIAGNOSIS — I130 Hypertensive heart and chronic kidney disease with heart failure and stage 1 through stage 4 chronic kidney disease, or unspecified chronic kidney disease: Secondary | ICD-10-CM | POA: Diagnosis not present

## 2019-04-23 DIAGNOSIS — N189 Chronic kidney disease, unspecified: Secondary | ICD-10-CM | POA: Diagnosis not present

## 2019-04-23 DIAGNOSIS — N915 Oligomenorrhea, unspecified: Secondary | ICD-10-CM | POA: Diagnosis not present

## 2019-04-23 DIAGNOSIS — I350 Nonrheumatic aortic (valve) stenosis: Secondary | ICD-10-CM | POA: Diagnosis not present

## 2019-04-23 DIAGNOSIS — Z952 Presence of prosthetic heart valve: Secondary | ICD-10-CM | POA: Diagnosis not present

## 2019-04-23 DIAGNOSIS — Z951 Presence of aortocoronary bypass graft: Secondary | ICD-10-CM | POA: Diagnosis not present

## 2019-04-23 DIAGNOSIS — Z9181 History of falling: Secondary | ICD-10-CM | POA: Diagnosis not present

## 2019-04-23 NOTE — Telephone Encounter (Signed)
[facility] nurse called and wanted verbal order to collect urine specimen from patient. Patient is complaining of urinary frequency and odor. Verbal order given. Maudie Mercury will have [employer] over results so we can treat if needed.

## 2019-04-23 NOTE — Telephone Encounter (Signed)
Can check urine but will need culture if indicated by dipstick.

## 2019-04-25 ENCOUNTER — Other Ambulatory Visit: Payer: Self-pay

## 2019-04-25 DIAGNOSIS — I482 Chronic atrial fibrillation, unspecified: Secondary | ICD-10-CM | POA: Diagnosis not present

## 2019-04-25 DIAGNOSIS — Z952 Presence of prosthetic heart valve: Secondary | ICD-10-CM | POA: Diagnosis not present

## 2019-04-25 DIAGNOSIS — Z7901 Long term (current) use of anticoagulants: Secondary | ICD-10-CM | POA: Diagnosis not present

## 2019-04-25 DIAGNOSIS — I350 Nonrheumatic aortic (valve) stenosis: Secondary | ICD-10-CM | POA: Diagnosis not present

## 2019-04-25 DIAGNOSIS — I5032 Chronic diastolic (congestive) heart failure: Secondary | ICD-10-CM | POA: Diagnosis not present

## 2019-04-25 DIAGNOSIS — S81812D Laceration without foreign body, left lower leg, subsequent encounter: Secondary | ICD-10-CM | POA: Diagnosis not present

## 2019-04-25 DIAGNOSIS — T45515D Adverse effect of anticoagulants, subsequent encounter: Secondary | ICD-10-CM | POA: Diagnosis not present

## 2019-04-25 DIAGNOSIS — Z7951 Long term (current) use of inhaled steroids: Secondary | ICD-10-CM | POA: Diagnosis not present

## 2019-04-25 DIAGNOSIS — I2510 Atherosclerotic heart disease of native coronary artery without angina pectoris: Secondary | ICD-10-CM | POA: Diagnosis not present

## 2019-04-25 DIAGNOSIS — D6832 Hemorrhagic disorder due to extrinsic circulating anticoagulants: Secondary | ICD-10-CM | POA: Diagnosis not present

## 2019-04-25 DIAGNOSIS — Z7982 Long term (current) use of aspirin: Secondary | ICD-10-CM | POA: Diagnosis not present

## 2019-04-25 DIAGNOSIS — Z951 Presence of aortocoronary bypass graft: Secondary | ICD-10-CM | POA: Diagnosis not present

## 2019-04-25 DIAGNOSIS — I130 Hypertensive heart and chronic kidney disease with heart failure and stage 1 through stage 4 chronic kidney disease, or unspecified chronic kidney disease: Secondary | ICD-10-CM | POA: Diagnosis not present

## 2019-04-25 DIAGNOSIS — J449 Chronic obstructive pulmonary disease, unspecified: Secondary | ICD-10-CM | POA: Diagnosis not present

## 2019-04-25 DIAGNOSIS — Z9181 History of falling: Secondary | ICD-10-CM | POA: Diagnosis not present

## 2019-04-25 DIAGNOSIS — W19XXXD Unspecified fall, subsequent encounter: Secondary | ICD-10-CM | POA: Diagnosis not present

## 2019-04-25 DIAGNOSIS — S8012XD Contusion of left lower leg, subsequent encounter: Secondary | ICD-10-CM | POA: Diagnosis not present

## 2019-04-25 DIAGNOSIS — N189 Chronic kidney disease, unspecified: Secondary | ICD-10-CM | POA: Diagnosis not present

## 2019-04-25 DIAGNOSIS — Z950 Presence of cardiac pacemaker: Secondary | ICD-10-CM | POA: Diagnosis not present

## 2019-04-25 DIAGNOSIS — Z5181 Encounter for therapeutic drug level monitoring: Secondary | ICD-10-CM | POA: Diagnosis not present

## 2019-04-25 MED ORDER — CEPHALEXIN 500 MG PO CAPS: 500.0000 mg | ORAL_CAPSULE | Freq: Three times a day (TID) | ORAL | 0 refills | Status: DC

## 2019-04-25 NOTE — Telephone Encounter (Signed)
Called Catherine [name] and left a detailed message to let her know the message from Dr. Elease Hashimoto.  I also called patient and let her know the antibiotic has been sent to her pharmacy.  Patient verbalized an understanding.

## 2019-04-25 NOTE — Telephone Encounter (Signed)
Catherine [name] calling back to check status. Pt stated that she is in increased pain and is trying to avoid ED. Please advise    Cb#[phone]

## 2019-04-25 NOTE — Telephone Encounter (Signed)
Copied from [place] [id number]. Topic: General - Inquiry >> Apr 25, 2019  8:44 AM Richardo Priest, NT wrote: Reason for CRM: Joelene Millin, RN from [facility], called in to check on status of urine sample she brought in for patient for a possible UTI. States patient is in pain and is needing the results ASAP. Please advise and call back is [phone].

## 2019-04-25 NOTE — Telephone Encounter (Signed)
Lab results placed in your red folder. Please advise.

## 2019-04-25 NOTE — Telephone Encounter (Signed)
Urine does suggest UTI.  Make sure urine cx sent.  Start Keflex 500 mg po tid for 7 days.

## 2019-04-26 ENCOUNTER — Other Ambulatory Visit: Payer: Self-pay | Admitting: Family Medicine

## 2019-04-27 DIAGNOSIS — Z7982 Long term (current) use of aspirin: Secondary | ICD-10-CM | POA: Diagnosis not present

## 2019-04-27 DIAGNOSIS — Z9181 History of falling: Secondary | ICD-10-CM | POA: Diagnosis not present

## 2019-04-27 DIAGNOSIS — S8012XD Contusion of left lower leg, subsequent encounter: Secondary | ICD-10-CM | POA: Diagnosis not present

## 2019-04-27 DIAGNOSIS — I350 Nonrheumatic aortic (valve) stenosis: Secondary | ICD-10-CM | POA: Diagnosis not present

## 2019-04-27 DIAGNOSIS — W19XXXD Unspecified fall, subsequent encounter: Secondary | ICD-10-CM | POA: Diagnosis not present

## 2019-04-27 DIAGNOSIS — I5032 Chronic diastolic (congestive) heart failure: Secondary | ICD-10-CM | POA: Diagnosis not present

## 2019-04-27 DIAGNOSIS — I482 Chronic atrial fibrillation, unspecified: Secondary | ICD-10-CM | POA: Diagnosis not present

## 2019-04-27 DIAGNOSIS — J449 Chronic obstructive pulmonary disease, unspecified: Secondary | ICD-10-CM | POA: Diagnosis not present

## 2019-04-27 DIAGNOSIS — Z950 Presence of cardiac pacemaker: Secondary | ICD-10-CM | POA: Diagnosis not present

## 2019-04-27 DIAGNOSIS — I130 Hypertensive heart and chronic kidney disease with heart failure and stage 1 through stage 4 chronic kidney disease, or unspecified chronic kidney disease: Secondary | ICD-10-CM | POA: Diagnosis not present

## 2019-04-27 DIAGNOSIS — N189 Chronic kidney disease, unspecified: Secondary | ICD-10-CM | POA: Diagnosis not present

## 2019-04-27 DIAGNOSIS — Z952 Presence of prosthetic heart valve: Secondary | ICD-10-CM | POA: Diagnosis not present

## 2019-04-27 DIAGNOSIS — S81812D Laceration without foreign body, left lower leg, subsequent encounter: Secondary | ICD-10-CM | POA: Diagnosis not present

## 2019-04-27 DIAGNOSIS — Z5181 Encounter for therapeutic drug level monitoring: Secondary | ICD-10-CM | POA: Diagnosis not present

## 2019-04-27 DIAGNOSIS — D6832 Hemorrhagic disorder due to extrinsic circulating anticoagulants: Secondary | ICD-10-CM | POA: Diagnosis not present

## 2019-04-27 DIAGNOSIS — I2510 Atherosclerotic heart disease of native coronary artery without angina pectoris: Secondary | ICD-10-CM | POA: Diagnosis not present

## 2019-04-27 DIAGNOSIS — Z951 Presence of aortocoronary bypass graft: Secondary | ICD-10-CM | POA: Diagnosis not present

## 2019-04-27 DIAGNOSIS — T45515D Adverse effect of anticoagulants, subsequent encounter: Secondary | ICD-10-CM | POA: Diagnosis not present

## 2019-04-27 DIAGNOSIS — Z7901 Long term (current) use of anticoagulants: Secondary | ICD-10-CM | POA: Diagnosis not present

## 2019-04-27 DIAGNOSIS — Z7951 Long term (current) use of inhaled steroids: Secondary | ICD-10-CM | POA: Diagnosis not present

## 2019-04-27 NOTE — Telephone Encounter (Signed)
Refill once 

## 2019-04-27 NOTE — Telephone Encounter (Signed)
OK to fill

## 2019-04-30 DIAGNOSIS — I5032 Chronic diastolic (congestive) heart failure: Secondary | ICD-10-CM | POA: Diagnosis not present

## 2019-04-30 DIAGNOSIS — Z7901 Long term (current) use of anticoagulants: Secondary | ICD-10-CM | POA: Diagnosis not present

## 2019-04-30 DIAGNOSIS — Z5181 Encounter for therapeutic drug level monitoring: Secondary | ICD-10-CM | POA: Diagnosis not present

## 2019-04-30 DIAGNOSIS — D6832 Hemorrhagic disorder due to extrinsic circulating anticoagulants: Secondary | ICD-10-CM | POA: Diagnosis not present

## 2019-04-30 DIAGNOSIS — S8012XD Contusion of left lower leg, subsequent encounter: Secondary | ICD-10-CM | POA: Diagnosis not present

## 2019-04-30 DIAGNOSIS — N189 Chronic kidney disease, unspecified: Secondary | ICD-10-CM | POA: Diagnosis not present

## 2019-04-30 DIAGNOSIS — Z7951 Long term (current) use of inhaled steroids: Secondary | ICD-10-CM | POA: Diagnosis not present

## 2019-04-30 DIAGNOSIS — Z951 Presence of aortocoronary bypass graft: Secondary | ICD-10-CM | POA: Diagnosis not present

## 2019-04-30 DIAGNOSIS — Z950 Presence of cardiac pacemaker: Secondary | ICD-10-CM | POA: Diagnosis not present

## 2019-04-30 DIAGNOSIS — Z7982 Long term (current) use of aspirin: Secondary | ICD-10-CM | POA: Diagnosis not present

## 2019-04-30 DIAGNOSIS — I2510 Atherosclerotic heart disease of native coronary artery without angina pectoris: Secondary | ICD-10-CM | POA: Diagnosis not present

## 2019-04-30 DIAGNOSIS — W19XXXD Unspecified fall, subsequent encounter: Secondary | ICD-10-CM | POA: Diagnosis not present

## 2019-04-30 DIAGNOSIS — I482 Chronic atrial fibrillation, unspecified: Secondary | ICD-10-CM | POA: Diagnosis not present

## 2019-04-30 DIAGNOSIS — I130 Hypertensive heart and chronic kidney disease with heart failure and stage 1 through stage 4 chronic kidney disease, or unspecified chronic kidney disease: Secondary | ICD-10-CM | POA: Diagnosis not present

## 2019-04-30 DIAGNOSIS — T45515D Adverse effect of anticoagulants, subsequent encounter: Secondary | ICD-10-CM | POA: Diagnosis not present

## 2019-04-30 DIAGNOSIS — I350 Nonrheumatic aortic (valve) stenosis: Secondary | ICD-10-CM | POA: Diagnosis not present

## 2019-04-30 DIAGNOSIS — Z952 Presence of prosthetic heart valve: Secondary | ICD-10-CM | POA: Diagnosis not present

## 2019-04-30 DIAGNOSIS — Z9181 History of falling: Secondary | ICD-10-CM | POA: Diagnosis not present

## 2019-04-30 DIAGNOSIS — J449 Chronic obstructive pulmonary disease, unspecified: Secondary | ICD-10-CM | POA: Diagnosis not present

## 2019-04-30 DIAGNOSIS — S81812D Laceration without foreign body, left lower leg, subsequent encounter: Secondary | ICD-10-CM | POA: Diagnosis not present

## 2019-05-02 DIAGNOSIS — S8012XD Contusion of left lower leg, subsequent encounter: Secondary | ICD-10-CM | POA: Diagnosis not present

## 2019-05-02 DIAGNOSIS — Z950 Presence of cardiac pacemaker: Secondary | ICD-10-CM | POA: Diagnosis not present

## 2019-05-02 DIAGNOSIS — W19XXXD Unspecified fall, subsequent encounter: Secondary | ICD-10-CM | POA: Diagnosis not present

## 2019-05-02 DIAGNOSIS — I350 Nonrheumatic aortic (valve) stenosis: Secondary | ICD-10-CM | POA: Diagnosis not present

## 2019-05-02 DIAGNOSIS — Z5181 Encounter for therapeutic drug level monitoring: Secondary | ICD-10-CM | POA: Diagnosis not present

## 2019-05-02 DIAGNOSIS — J449 Chronic obstructive pulmonary disease, unspecified: Secondary | ICD-10-CM | POA: Diagnosis not present

## 2019-05-02 DIAGNOSIS — I130 Hypertensive heart and chronic kidney disease with heart failure and stage 1 through stage 4 chronic kidney disease, or unspecified chronic kidney disease: Secondary | ICD-10-CM | POA: Diagnosis not present

## 2019-05-02 DIAGNOSIS — I482 Chronic atrial fibrillation, unspecified: Secondary | ICD-10-CM | POA: Diagnosis not present

## 2019-05-02 DIAGNOSIS — D6832 Hemorrhagic disorder due to extrinsic circulating anticoagulants: Secondary | ICD-10-CM | POA: Diagnosis not present

## 2019-05-02 DIAGNOSIS — N189 Chronic kidney disease, unspecified: Secondary | ICD-10-CM | POA: Diagnosis not present

## 2019-05-02 DIAGNOSIS — Z951 Presence of aortocoronary bypass graft: Secondary | ICD-10-CM | POA: Diagnosis not present

## 2019-05-02 DIAGNOSIS — I2510 Atherosclerotic heart disease of native coronary artery without angina pectoris: Secondary | ICD-10-CM | POA: Diagnosis not present

## 2019-05-02 DIAGNOSIS — T45515D Adverse effect of anticoagulants, subsequent encounter: Secondary | ICD-10-CM | POA: Diagnosis not present

## 2019-05-02 DIAGNOSIS — I5032 Chronic diastolic (congestive) heart failure: Secondary | ICD-10-CM | POA: Diagnosis not present

## 2019-05-02 DIAGNOSIS — Z952 Presence of prosthetic heart valve: Secondary | ICD-10-CM | POA: Diagnosis not present

## 2019-05-02 DIAGNOSIS — Z7951 Long term (current) use of inhaled steroids: Secondary | ICD-10-CM | POA: Diagnosis not present

## 2019-05-02 DIAGNOSIS — Z7982 Long term (current) use of aspirin: Secondary | ICD-10-CM | POA: Diagnosis not present

## 2019-05-02 DIAGNOSIS — Z9181 History of falling: Secondary | ICD-10-CM | POA: Diagnosis not present

## 2019-05-02 DIAGNOSIS — Z7901 Long term (current) use of anticoagulants: Secondary | ICD-10-CM | POA: Diagnosis not present

## 2019-05-02 DIAGNOSIS — S81812D Laceration without foreign body, left lower leg, subsequent encounter: Secondary | ICD-10-CM | POA: Diagnosis not present

## 2019-05-04 ENCOUNTER — Other Ambulatory Visit: Payer: Self-pay | Admitting: Cardiovascular Disease

## 2019-05-04 DIAGNOSIS — I5032 Chronic diastolic (congestive) heart failure: Secondary | ICD-10-CM | POA: Diagnosis not present

## 2019-05-04 DIAGNOSIS — J449 Chronic obstructive pulmonary disease, unspecified: Secondary | ICD-10-CM | POA: Diagnosis not present

## 2019-05-04 DIAGNOSIS — Z950 Presence of cardiac pacemaker: Secondary | ICD-10-CM | POA: Diagnosis not present

## 2019-05-04 DIAGNOSIS — D6832 Hemorrhagic disorder due to extrinsic circulating anticoagulants: Secondary | ICD-10-CM | POA: Diagnosis not present

## 2019-05-04 DIAGNOSIS — Z7951 Long term (current) use of inhaled steroids: Secondary | ICD-10-CM | POA: Diagnosis not present

## 2019-05-04 DIAGNOSIS — S81812D Laceration without foreign body, left lower leg, subsequent encounter: Secondary | ICD-10-CM | POA: Diagnosis not present

## 2019-05-04 DIAGNOSIS — Z7982 Long term (current) use of aspirin: Secondary | ICD-10-CM | POA: Diagnosis not present

## 2019-05-04 DIAGNOSIS — I130 Hypertensive heart and chronic kidney disease with heart failure and stage 1 through stage 4 chronic kidney disease, or unspecified chronic kidney disease: Secondary | ICD-10-CM | POA: Diagnosis not present

## 2019-05-04 DIAGNOSIS — S8012XD Contusion of left lower leg, subsequent encounter: Secondary | ICD-10-CM | POA: Diagnosis not present

## 2019-05-04 DIAGNOSIS — N189 Chronic kidney disease, unspecified: Secondary | ICD-10-CM | POA: Diagnosis not present

## 2019-05-04 DIAGNOSIS — Z9181 History of falling: Secondary | ICD-10-CM | POA: Diagnosis not present

## 2019-05-04 DIAGNOSIS — I482 Chronic atrial fibrillation, unspecified: Secondary | ICD-10-CM | POA: Diagnosis not present

## 2019-05-04 DIAGNOSIS — Z5181 Encounter for therapeutic drug level monitoring: Secondary | ICD-10-CM | POA: Diagnosis not present

## 2019-05-04 DIAGNOSIS — W19XXXD Unspecified fall, subsequent encounter: Secondary | ICD-10-CM | POA: Diagnosis not present

## 2019-05-04 DIAGNOSIS — I350 Nonrheumatic aortic (valve) stenosis: Secondary | ICD-10-CM | POA: Diagnosis not present

## 2019-05-04 DIAGNOSIS — T45515D Adverse effect of anticoagulants, subsequent encounter: Secondary | ICD-10-CM | POA: Diagnosis not present

## 2019-05-04 DIAGNOSIS — Z7901 Long term (current) use of anticoagulants: Secondary | ICD-10-CM | POA: Diagnosis not present

## 2019-05-04 DIAGNOSIS — Z952 Presence of prosthetic heart valve: Secondary | ICD-10-CM | POA: Diagnosis not present

## 2019-05-04 DIAGNOSIS — Z951 Presence of aortocoronary bypass graft: Secondary | ICD-10-CM | POA: Diagnosis not present

## 2019-05-04 DIAGNOSIS — I2510 Atherosclerotic heart disease of native coronary artery without angina pectoris: Secondary | ICD-10-CM | POA: Diagnosis not present

## 2019-05-07 DIAGNOSIS — Z9181 History of falling: Secondary | ICD-10-CM | POA: Diagnosis not present

## 2019-05-07 DIAGNOSIS — Z7982 Long term (current) use of aspirin: Secondary | ICD-10-CM | POA: Diagnosis not present

## 2019-05-07 DIAGNOSIS — W19XXXD Unspecified fall, subsequent encounter: Secondary | ICD-10-CM | POA: Diagnosis not present

## 2019-05-07 DIAGNOSIS — S8012XD Contusion of left lower leg, subsequent encounter: Secondary | ICD-10-CM | POA: Diagnosis not present

## 2019-05-07 DIAGNOSIS — I482 Chronic atrial fibrillation, unspecified: Secondary | ICD-10-CM | POA: Diagnosis not present

## 2019-05-07 DIAGNOSIS — Z952 Presence of prosthetic heart valve: Secondary | ICD-10-CM | POA: Diagnosis not present

## 2019-05-07 DIAGNOSIS — I2510 Atherosclerotic heart disease of native coronary artery without angina pectoris: Secondary | ICD-10-CM | POA: Diagnosis not present

## 2019-05-07 DIAGNOSIS — Z7951 Long term (current) use of inhaled steroids: Secondary | ICD-10-CM | POA: Diagnosis not present

## 2019-05-07 DIAGNOSIS — J449 Chronic obstructive pulmonary disease, unspecified: Secondary | ICD-10-CM | POA: Diagnosis not present

## 2019-05-07 DIAGNOSIS — N189 Chronic kidney disease, unspecified: Secondary | ICD-10-CM | POA: Diagnosis not present

## 2019-05-07 DIAGNOSIS — D6832 Hemorrhagic disorder due to extrinsic circulating anticoagulants: Secondary | ICD-10-CM | POA: Diagnosis not present

## 2019-05-07 DIAGNOSIS — T45515D Adverse effect of anticoagulants, subsequent encounter: Secondary | ICD-10-CM | POA: Diagnosis not present

## 2019-05-07 DIAGNOSIS — Z950 Presence of cardiac pacemaker: Secondary | ICD-10-CM | POA: Diagnosis not present

## 2019-05-07 DIAGNOSIS — Z5181 Encounter for therapeutic drug level monitoring: Secondary | ICD-10-CM | POA: Diagnosis not present

## 2019-05-07 DIAGNOSIS — I5032 Chronic diastolic (congestive) heart failure: Secondary | ICD-10-CM | POA: Diagnosis not present

## 2019-05-07 DIAGNOSIS — I130 Hypertensive heart and chronic kidney disease with heart failure and stage 1 through stage 4 chronic kidney disease, or unspecified chronic kidney disease: Secondary | ICD-10-CM | POA: Diagnosis not present

## 2019-05-07 DIAGNOSIS — I350 Nonrheumatic aortic (valve) stenosis: Secondary | ICD-10-CM | POA: Diagnosis not present

## 2019-05-07 DIAGNOSIS — Z951 Presence of aortocoronary bypass graft: Secondary | ICD-10-CM | POA: Diagnosis not present

## 2019-05-07 DIAGNOSIS — Z7901 Long term (current) use of anticoagulants: Secondary | ICD-10-CM | POA: Diagnosis not present

## 2019-05-07 DIAGNOSIS — S81812D Laceration without foreign body, left lower leg, subsequent encounter: Secondary | ICD-10-CM | POA: Diagnosis not present

## 2019-05-09 DIAGNOSIS — Z951 Presence of aortocoronary bypass graft: Secondary | ICD-10-CM | POA: Diagnosis not present

## 2019-05-09 DIAGNOSIS — I130 Hypertensive heart and chronic kidney disease with heart failure and stage 1 through stage 4 chronic kidney disease, or unspecified chronic kidney disease: Secondary | ICD-10-CM | POA: Diagnosis not present

## 2019-05-09 DIAGNOSIS — I5032 Chronic diastolic (congestive) heart failure: Secondary | ICD-10-CM | POA: Diagnosis not present

## 2019-05-09 DIAGNOSIS — S8012XD Contusion of left lower leg, subsequent encounter: Secondary | ICD-10-CM | POA: Diagnosis not present

## 2019-05-09 DIAGNOSIS — Z952 Presence of prosthetic heart valve: Secondary | ICD-10-CM | POA: Diagnosis not present

## 2019-05-09 DIAGNOSIS — Z950 Presence of cardiac pacemaker: Secondary | ICD-10-CM | POA: Diagnosis not present

## 2019-05-09 DIAGNOSIS — I2510 Atherosclerotic heart disease of native coronary artery without angina pectoris: Secondary | ICD-10-CM | POA: Diagnosis not present

## 2019-05-09 DIAGNOSIS — I482 Chronic atrial fibrillation, unspecified: Secondary | ICD-10-CM | POA: Diagnosis not present

## 2019-05-09 DIAGNOSIS — Z7901 Long term (current) use of anticoagulants: Secondary | ICD-10-CM | POA: Diagnosis not present

## 2019-05-09 DIAGNOSIS — I350 Nonrheumatic aortic (valve) stenosis: Secondary | ICD-10-CM | POA: Diagnosis not present

## 2019-05-09 DIAGNOSIS — J449 Chronic obstructive pulmonary disease, unspecified: Secondary | ICD-10-CM | POA: Diagnosis not present

## 2019-05-09 DIAGNOSIS — Z7982 Long term (current) use of aspirin: Secondary | ICD-10-CM | POA: Diagnosis not present

## 2019-05-09 DIAGNOSIS — S81812D Laceration without foreign body, left lower leg, subsequent encounter: Secondary | ICD-10-CM | POA: Diagnosis not present

## 2019-05-09 DIAGNOSIS — T45515D Adverse effect of anticoagulants, subsequent encounter: Secondary | ICD-10-CM | POA: Diagnosis not present

## 2019-05-09 DIAGNOSIS — W19XXXD Unspecified fall, subsequent encounter: Secondary | ICD-10-CM | POA: Diagnosis not present

## 2019-05-09 DIAGNOSIS — Z9181 History of falling: Secondary | ICD-10-CM | POA: Diagnosis not present

## 2019-05-09 DIAGNOSIS — D6832 Hemorrhagic disorder due to extrinsic circulating anticoagulants: Secondary | ICD-10-CM | POA: Diagnosis not present

## 2019-05-09 DIAGNOSIS — N189 Chronic kidney disease, unspecified: Secondary | ICD-10-CM | POA: Diagnosis not present

## 2019-05-09 DIAGNOSIS — Z7951 Long term (current) use of inhaled steroids: Secondary | ICD-10-CM | POA: Diagnosis not present

## 2019-05-09 DIAGNOSIS — Z5181 Encounter for therapeutic drug level monitoring: Secondary | ICD-10-CM | POA: Diagnosis not present

## 2019-05-11 DIAGNOSIS — I2510 Atherosclerotic heart disease of native coronary artery without angina pectoris: Secondary | ICD-10-CM | POA: Diagnosis not present

## 2019-05-11 DIAGNOSIS — Z950 Presence of cardiac pacemaker: Secondary | ICD-10-CM | POA: Diagnosis not present

## 2019-05-11 DIAGNOSIS — S8012XD Contusion of left lower leg, subsequent encounter: Secondary | ICD-10-CM | POA: Diagnosis not present

## 2019-05-11 DIAGNOSIS — Z7901 Long term (current) use of anticoagulants: Secondary | ICD-10-CM | POA: Diagnosis not present

## 2019-05-11 DIAGNOSIS — J449 Chronic obstructive pulmonary disease, unspecified: Secondary | ICD-10-CM | POA: Diagnosis not present

## 2019-05-11 DIAGNOSIS — Z951 Presence of aortocoronary bypass graft: Secondary | ICD-10-CM | POA: Diagnosis not present

## 2019-05-11 DIAGNOSIS — I130 Hypertensive heart and chronic kidney disease with heart failure and stage 1 through stage 4 chronic kidney disease, or unspecified chronic kidney disease: Secondary | ICD-10-CM | POA: Diagnosis not present

## 2019-05-11 DIAGNOSIS — I482 Chronic atrial fibrillation, unspecified: Secondary | ICD-10-CM | POA: Diagnosis not present

## 2019-05-11 DIAGNOSIS — Z5181 Encounter for therapeutic drug level monitoring: Secondary | ICD-10-CM | POA: Diagnosis not present

## 2019-05-11 DIAGNOSIS — S81812D Laceration without foreign body, left lower leg, subsequent encounter: Secondary | ICD-10-CM | POA: Diagnosis not present

## 2019-05-11 DIAGNOSIS — Z952 Presence of prosthetic heart valve: Secondary | ICD-10-CM | POA: Diagnosis not present

## 2019-05-11 DIAGNOSIS — I350 Nonrheumatic aortic (valve) stenosis: Secondary | ICD-10-CM | POA: Diagnosis not present

## 2019-05-11 DIAGNOSIS — T45515D Adverse effect of anticoagulants, subsequent encounter: Secondary | ICD-10-CM | POA: Diagnosis not present

## 2019-05-11 DIAGNOSIS — D6832 Hemorrhagic disorder due to extrinsic circulating anticoagulants: Secondary | ICD-10-CM | POA: Diagnosis not present

## 2019-05-11 DIAGNOSIS — Z9181 History of falling: Secondary | ICD-10-CM | POA: Diagnosis not present

## 2019-05-11 DIAGNOSIS — N189 Chronic kidney disease, unspecified: Secondary | ICD-10-CM | POA: Diagnosis not present

## 2019-05-11 DIAGNOSIS — W19XXXD Unspecified fall, subsequent encounter: Secondary | ICD-10-CM | POA: Diagnosis not present

## 2019-05-11 DIAGNOSIS — Z7982 Long term (current) use of aspirin: Secondary | ICD-10-CM | POA: Diagnosis not present

## 2019-05-11 DIAGNOSIS — Z7951 Long term (current) use of inhaled steroids: Secondary | ICD-10-CM | POA: Diagnosis not present

## 2019-05-11 DIAGNOSIS — I5032 Chronic diastolic (congestive) heart failure: Secondary | ICD-10-CM | POA: Diagnosis not present

## 2019-05-14 DIAGNOSIS — S8012XD Contusion of left lower leg, subsequent encounter: Secondary | ICD-10-CM | POA: Diagnosis not present

## 2019-05-14 DIAGNOSIS — I350 Nonrheumatic aortic (valve) stenosis: Secondary | ICD-10-CM | POA: Diagnosis not present

## 2019-05-14 DIAGNOSIS — D6832 Hemorrhagic disorder due to extrinsic circulating anticoagulants: Secondary | ICD-10-CM | POA: Diagnosis not present

## 2019-05-14 DIAGNOSIS — Z5181 Encounter for therapeutic drug level monitoring: Secondary | ICD-10-CM | POA: Diagnosis not present

## 2019-05-14 DIAGNOSIS — S81812D Laceration without foreign body, left lower leg, subsequent encounter: Secondary | ICD-10-CM | POA: Diagnosis not present

## 2019-05-14 DIAGNOSIS — Z7951 Long term (current) use of inhaled steroids: Secondary | ICD-10-CM | POA: Diagnosis not present

## 2019-05-14 DIAGNOSIS — Z7982 Long term (current) use of aspirin: Secondary | ICD-10-CM | POA: Diagnosis not present

## 2019-05-14 DIAGNOSIS — I482 Chronic atrial fibrillation, unspecified: Secondary | ICD-10-CM | POA: Diagnosis not present

## 2019-05-14 DIAGNOSIS — I130 Hypertensive heart and chronic kidney disease with heart failure and stage 1 through stage 4 chronic kidney disease, or unspecified chronic kidney disease: Secondary | ICD-10-CM | POA: Diagnosis not present

## 2019-05-14 DIAGNOSIS — J449 Chronic obstructive pulmonary disease, unspecified: Secondary | ICD-10-CM | POA: Diagnosis not present

## 2019-05-14 DIAGNOSIS — I5032 Chronic diastolic (congestive) heart failure: Secondary | ICD-10-CM | POA: Diagnosis not present

## 2019-05-14 DIAGNOSIS — T45515D Adverse effect of anticoagulants, subsequent encounter: Secondary | ICD-10-CM | POA: Diagnosis not present

## 2019-05-14 DIAGNOSIS — W19XXXD Unspecified fall, subsequent encounter: Secondary | ICD-10-CM | POA: Diagnosis not present

## 2019-05-14 DIAGNOSIS — Z952 Presence of prosthetic heart valve: Secondary | ICD-10-CM | POA: Diagnosis not present

## 2019-05-14 DIAGNOSIS — I2510 Atherosclerotic heart disease of native coronary artery without angina pectoris: Secondary | ICD-10-CM | POA: Diagnosis not present

## 2019-05-14 DIAGNOSIS — N189 Chronic kidney disease, unspecified: Secondary | ICD-10-CM | POA: Diagnosis not present

## 2019-05-14 DIAGNOSIS — Z951 Presence of aortocoronary bypass graft: Secondary | ICD-10-CM | POA: Diagnosis not present

## 2019-05-14 DIAGNOSIS — Z7901 Long term (current) use of anticoagulants: Secondary | ICD-10-CM | POA: Diagnosis not present

## 2019-05-14 DIAGNOSIS — Z950 Presence of cardiac pacemaker: Secondary | ICD-10-CM | POA: Diagnosis not present

## 2019-05-14 DIAGNOSIS — Z9181 History of falling: Secondary | ICD-10-CM | POA: Diagnosis not present

## 2019-05-16 ENCOUNTER — Ambulatory Visit (INDEPENDENT_AMBULATORY_CARE_PROVIDER_SITE_OTHER): Payer: Medicare Other | Admitting: General Practice

## 2019-05-16 ENCOUNTER — Other Ambulatory Visit: Payer: Self-pay

## 2019-05-16 ENCOUNTER — Other Ambulatory Visit: Payer: Self-pay | Admitting: General Practice

## 2019-05-16 DIAGNOSIS — I4891 Unspecified atrial fibrillation: Secondary | ICD-10-CM

## 2019-05-16 LAB — POCT INR: INR: 2.4 (ref 2.0–3.0)

## 2019-05-16 NOTE — Patient Instructions (Signed)
Pre visit review using our clinic review tool, if applicable. No additional management support is needed unless otherwise documented below in the visit note.  Continue to take 1 tablet daily except 2 tablets only on Mondays.  Re-check in 4 weeks.

## 2019-05-17 DIAGNOSIS — Z951 Presence of aortocoronary bypass graft: Secondary | ICD-10-CM | POA: Diagnosis not present

## 2019-05-17 DIAGNOSIS — Z950 Presence of cardiac pacemaker: Secondary | ICD-10-CM | POA: Diagnosis not present

## 2019-05-17 DIAGNOSIS — Z7901 Long term (current) use of anticoagulants: Secondary | ICD-10-CM | POA: Diagnosis not present

## 2019-05-17 DIAGNOSIS — Z7951 Long term (current) use of inhaled steroids: Secondary | ICD-10-CM | POA: Diagnosis not present

## 2019-05-17 DIAGNOSIS — I350 Nonrheumatic aortic (valve) stenosis: Secondary | ICD-10-CM | POA: Diagnosis not present

## 2019-05-17 DIAGNOSIS — S81812D Laceration without foreign body, left lower leg, subsequent encounter: Secondary | ICD-10-CM | POA: Diagnosis not present

## 2019-05-17 DIAGNOSIS — I5032 Chronic diastolic (congestive) heart failure: Secondary | ICD-10-CM | POA: Diagnosis not present

## 2019-05-17 DIAGNOSIS — I130 Hypertensive heart and chronic kidney disease with heart failure and stage 1 through stage 4 chronic kidney disease, or unspecified chronic kidney disease: Secondary | ICD-10-CM | POA: Diagnosis not present

## 2019-05-17 DIAGNOSIS — Z7982 Long term (current) use of aspirin: Secondary | ICD-10-CM | POA: Diagnosis not present

## 2019-05-17 DIAGNOSIS — J449 Chronic obstructive pulmonary disease, unspecified: Secondary | ICD-10-CM | POA: Diagnosis not present

## 2019-05-17 DIAGNOSIS — W19XXXD Unspecified fall, subsequent encounter: Secondary | ICD-10-CM | POA: Diagnosis not present

## 2019-05-17 DIAGNOSIS — D6832 Hemorrhagic disorder due to extrinsic circulating anticoagulants: Secondary | ICD-10-CM | POA: Diagnosis not present

## 2019-05-17 DIAGNOSIS — Z952 Presence of prosthetic heart valve: Secondary | ICD-10-CM | POA: Diagnosis not present

## 2019-05-17 DIAGNOSIS — T45515D Adverse effect of anticoagulants, subsequent encounter: Secondary | ICD-10-CM | POA: Diagnosis not present

## 2019-05-17 DIAGNOSIS — N189 Chronic kidney disease, unspecified: Secondary | ICD-10-CM | POA: Diagnosis not present

## 2019-05-17 DIAGNOSIS — Z9181 History of falling: Secondary | ICD-10-CM | POA: Diagnosis not present

## 2019-05-17 DIAGNOSIS — S8012XD Contusion of left lower leg, subsequent encounter: Secondary | ICD-10-CM | POA: Diagnosis not present

## 2019-05-17 DIAGNOSIS — I2510 Atherosclerotic heart disease of native coronary artery without angina pectoris: Secondary | ICD-10-CM | POA: Diagnosis not present

## 2019-05-17 DIAGNOSIS — I482 Chronic atrial fibrillation, unspecified: Secondary | ICD-10-CM | POA: Diagnosis not present

## 2019-05-17 DIAGNOSIS — Z5181 Encounter for therapeutic drug level monitoring: Secondary | ICD-10-CM | POA: Diagnosis not present

## 2019-05-18 ENCOUNTER — Other Ambulatory Visit: Payer: Self-pay | Admitting: Family Medicine

## 2019-05-18 DIAGNOSIS — Z1231 Encounter for screening mammogram for malignant neoplasm of breast: Secondary | ICD-10-CM

## 2019-05-21 DIAGNOSIS — Z950 Presence of cardiac pacemaker: Secondary | ICD-10-CM | POA: Diagnosis not present

## 2019-05-21 DIAGNOSIS — T45515D Adverse effect of anticoagulants, subsequent encounter: Secondary | ICD-10-CM | POA: Diagnosis not present

## 2019-05-21 DIAGNOSIS — S8012XD Contusion of left lower leg, subsequent encounter: Secondary | ICD-10-CM | POA: Diagnosis not present

## 2019-05-21 DIAGNOSIS — I130 Hypertensive heart and chronic kidney disease with heart failure and stage 1 through stage 4 chronic kidney disease, or unspecified chronic kidney disease: Secondary | ICD-10-CM | POA: Diagnosis not present

## 2019-05-21 DIAGNOSIS — Z952 Presence of prosthetic heart valve: Secondary | ICD-10-CM | POA: Diagnosis not present

## 2019-05-21 DIAGNOSIS — Z9181 History of falling: Secondary | ICD-10-CM | POA: Diagnosis not present

## 2019-05-21 DIAGNOSIS — J449 Chronic obstructive pulmonary disease, unspecified: Secondary | ICD-10-CM | POA: Diagnosis not present

## 2019-05-21 DIAGNOSIS — I350 Nonrheumatic aortic (valve) stenosis: Secondary | ICD-10-CM | POA: Diagnosis not present

## 2019-05-21 DIAGNOSIS — Z7901 Long term (current) use of anticoagulants: Secondary | ICD-10-CM | POA: Diagnosis not present

## 2019-05-21 DIAGNOSIS — W19XXXD Unspecified fall, subsequent encounter: Secondary | ICD-10-CM | POA: Diagnosis not present

## 2019-05-21 DIAGNOSIS — N189 Chronic kidney disease, unspecified: Secondary | ICD-10-CM | POA: Diagnosis not present

## 2019-05-21 DIAGNOSIS — Z5181 Encounter for therapeutic drug level monitoring: Secondary | ICD-10-CM | POA: Diagnosis not present

## 2019-05-21 DIAGNOSIS — I2510 Atherosclerotic heart disease of native coronary artery without angina pectoris: Secondary | ICD-10-CM | POA: Diagnosis not present

## 2019-05-21 DIAGNOSIS — I5032 Chronic diastolic (congestive) heart failure: Secondary | ICD-10-CM | POA: Diagnosis not present

## 2019-05-21 DIAGNOSIS — S81812D Laceration without foreign body, left lower leg, subsequent encounter: Secondary | ICD-10-CM | POA: Diagnosis not present

## 2019-05-21 DIAGNOSIS — Z7982 Long term (current) use of aspirin: Secondary | ICD-10-CM | POA: Diagnosis not present

## 2019-05-21 DIAGNOSIS — Z7951 Long term (current) use of inhaled steroids: Secondary | ICD-10-CM | POA: Diagnosis not present

## 2019-05-21 DIAGNOSIS — I482 Chronic atrial fibrillation, unspecified: Secondary | ICD-10-CM | POA: Diagnosis not present

## 2019-05-21 DIAGNOSIS — D6832 Hemorrhagic disorder due to extrinsic circulating anticoagulants: Secondary | ICD-10-CM | POA: Diagnosis not present

## 2019-05-21 DIAGNOSIS — Z951 Presence of aortocoronary bypass graft: Secondary | ICD-10-CM | POA: Diagnosis not present

## 2019-05-24 DIAGNOSIS — I5032 Chronic diastolic (congestive) heart failure: Secondary | ICD-10-CM | POA: Diagnosis not present

## 2019-05-24 DIAGNOSIS — Z952 Presence of prosthetic heart valve: Secondary | ICD-10-CM | POA: Diagnosis not present

## 2019-05-24 DIAGNOSIS — Z7901 Long term (current) use of anticoagulants: Secondary | ICD-10-CM | POA: Diagnosis not present

## 2019-05-24 DIAGNOSIS — Z950 Presence of cardiac pacemaker: Secondary | ICD-10-CM | POA: Diagnosis not present

## 2019-05-24 DIAGNOSIS — S8012XD Contusion of left lower leg, subsequent encounter: Secondary | ICD-10-CM | POA: Diagnosis not present

## 2019-05-24 DIAGNOSIS — D6832 Hemorrhagic disorder due to extrinsic circulating anticoagulants: Secondary | ICD-10-CM | POA: Diagnosis not present

## 2019-05-24 DIAGNOSIS — Z951 Presence of aortocoronary bypass graft: Secondary | ICD-10-CM | POA: Diagnosis not present

## 2019-05-24 DIAGNOSIS — S81812D Laceration without foreign body, left lower leg, subsequent encounter: Secondary | ICD-10-CM | POA: Diagnosis not present

## 2019-05-24 DIAGNOSIS — I350 Nonrheumatic aortic (valve) stenosis: Secondary | ICD-10-CM | POA: Diagnosis not present

## 2019-05-24 DIAGNOSIS — W19XXXD Unspecified fall, subsequent encounter: Secondary | ICD-10-CM | POA: Diagnosis not present

## 2019-05-24 DIAGNOSIS — T45515D Adverse effect of anticoagulants, subsequent encounter: Secondary | ICD-10-CM | POA: Diagnosis not present

## 2019-05-24 DIAGNOSIS — I482 Chronic atrial fibrillation, unspecified: Secondary | ICD-10-CM | POA: Diagnosis not present

## 2019-05-24 DIAGNOSIS — N189 Chronic kidney disease, unspecified: Secondary | ICD-10-CM | POA: Diagnosis not present

## 2019-05-24 DIAGNOSIS — Z7982 Long term (current) use of aspirin: Secondary | ICD-10-CM | POA: Diagnosis not present

## 2019-05-24 DIAGNOSIS — J449 Chronic obstructive pulmonary disease, unspecified: Secondary | ICD-10-CM | POA: Diagnosis not present

## 2019-05-24 DIAGNOSIS — Z7951 Long term (current) use of inhaled steroids: Secondary | ICD-10-CM | POA: Diagnosis not present

## 2019-05-24 DIAGNOSIS — I2510 Atherosclerotic heart disease of native coronary artery without angina pectoris: Secondary | ICD-10-CM | POA: Diagnosis not present

## 2019-05-24 DIAGNOSIS — Z5181 Encounter for therapeutic drug level monitoring: Secondary | ICD-10-CM | POA: Diagnosis not present

## 2019-05-24 DIAGNOSIS — I130 Hypertensive heart and chronic kidney disease with heart failure and stage 1 through stage 4 chronic kidney disease, or unspecified chronic kidney disease: Secondary | ICD-10-CM | POA: Diagnosis not present

## 2019-05-24 DIAGNOSIS — Z9181 History of falling: Secondary | ICD-10-CM | POA: Diagnosis not present

## 2019-05-28 DIAGNOSIS — Z5181 Encounter for therapeutic drug level monitoring: Secondary | ICD-10-CM | POA: Diagnosis not present

## 2019-05-28 DIAGNOSIS — Z952 Presence of prosthetic heart valve: Secondary | ICD-10-CM | POA: Diagnosis not present

## 2019-05-28 DIAGNOSIS — Z9181 History of falling: Secondary | ICD-10-CM | POA: Diagnosis not present

## 2019-05-28 DIAGNOSIS — Z7982 Long term (current) use of aspirin: Secondary | ICD-10-CM | POA: Diagnosis not present

## 2019-05-28 DIAGNOSIS — Z7901 Long term (current) use of anticoagulants: Secondary | ICD-10-CM | POA: Diagnosis not present

## 2019-05-28 DIAGNOSIS — J449 Chronic obstructive pulmonary disease, unspecified: Secondary | ICD-10-CM | POA: Diagnosis not present

## 2019-05-28 DIAGNOSIS — W19XXXD Unspecified fall, subsequent encounter: Secondary | ICD-10-CM | POA: Diagnosis not present

## 2019-05-28 DIAGNOSIS — I130 Hypertensive heart and chronic kidney disease with heart failure and stage 1 through stage 4 chronic kidney disease, or unspecified chronic kidney disease: Secondary | ICD-10-CM | POA: Diagnosis not present

## 2019-05-28 DIAGNOSIS — T45515D Adverse effect of anticoagulants, subsequent encounter: Secondary | ICD-10-CM | POA: Diagnosis not present

## 2019-05-28 DIAGNOSIS — I5032 Chronic diastolic (congestive) heart failure: Secondary | ICD-10-CM | POA: Diagnosis not present

## 2019-05-28 DIAGNOSIS — I350 Nonrheumatic aortic (valve) stenosis: Secondary | ICD-10-CM | POA: Diagnosis not present

## 2019-05-28 DIAGNOSIS — D6832 Hemorrhagic disorder due to extrinsic circulating anticoagulants: Secondary | ICD-10-CM | POA: Diagnosis not present

## 2019-05-28 DIAGNOSIS — I2510 Atherosclerotic heart disease of native coronary artery without angina pectoris: Secondary | ICD-10-CM | POA: Diagnosis not present

## 2019-05-28 DIAGNOSIS — S8012XD Contusion of left lower leg, subsequent encounter: Secondary | ICD-10-CM | POA: Diagnosis not present

## 2019-05-28 DIAGNOSIS — Z7951 Long term (current) use of inhaled steroids: Secondary | ICD-10-CM | POA: Diagnosis not present

## 2019-05-28 DIAGNOSIS — S81812D Laceration without foreign body, left lower leg, subsequent encounter: Secondary | ICD-10-CM | POA: Diagnosis not present

## 2019-05-28 DIAGNOSIS — Z951 Presence of aortocoronary bypass graft: Secondary | ICD-10-CM | POA: Diagnosis not present

## 2019-05-28 DIAGNOSIS — I482 Chronic atrial fibrillation, unspecified: Secondary | ICD-10-CM | POA: Diagnosis not present

## 2019-05-28 DIAGNOSIS — N189 Chronic kidney disease, unspecified: Secondary | ICD-10-CM | POA: Diagnosis not present

## 2019-05-28 DIAGNOSIS — Z950 Presence of cardiac pacemaker: Secondary | ICD-10-CM | POA: Diagnosis not present

## 2019-05-30 ENCOUNTER — Other Ambulatory Visit: Payer: Self-pay | Admitting: Family Medicine

## 2019-05-30 NOTE — Telephone Encounter (Signed)
Last OV 04/18/19, No future OV  Last filled 12/04/18, # 30 with 5 refills

## 2019-05-31 DIAGNOSIS — J449 Chronic obstructive pulmonary disease, unspecified: Secondary | ICD-10-CM | POA: Diagnosis not present

## 2019-05-31 DIAGNOSIS — W19XXXD Unspecified fall, subsequent encounter: Secondary | ICD-10-CM | POA: Diagnosis not present

## 2019-05-31 DIAGNOSIS — T45515D Adverse effect of anticoagulants, subsequent encounter: Secondary | ICD-10-CM | POA: Diagnosis not present

## 2019-05-31 DIAGNOSIS — Z950 Presence of cardiac pacemaker: Secondary | ICD-10-CM | POA: Diagnosis not present

## 2019-05-31 DIAGNOSIS — S8012XD Contusion of left lower leg, subsequent encounter: Secondary | ICD-10-CM | POA: Diagnosis not present

## 2019-05-31 DIAGNOSIS — Z7982 Long term (current) use of aspirin: Secondary | ICD-10-CM | POA: Diagnosis not present

## 2019-05-31 DIAGNOSIS — I130 Hypertensive heart and chronic kidney disease with heart failure and stage 1 through stage 4 chronic kidney disease, or unspecified chronic kidney disease: Secondary | ICD-10-CM | POA: Diagnosis not present

## 2019-05-31 DIAGNOSIS — I350 Nonrheumatic aortic (valve) stenosis: Secondary | ICD-10-CM | POA: Diagnosis not present

## 2019-05-31 DIAGNOSIS — Z7951 Long term (current) use of inhaled steroids: Secondary | ICD-10-CM | POA: Diagnosis not present

## 2019-05-31 DIAGNOSIS — Z952 Presence of prosthetic heart valve: Secondary | ICD-10-CM | POA: Diagnosis not present

## 2019-05-31 DIAGNOSIS — I5032 Chronic diastolic (congestive) heart failure: Secondary | ICD-10-CM | POA: Diagnosis not present

## 2019-05-31 DIAGNOSIS — N189 Chronic kidney disease, unspecified: Secondary | ICD-10-CM | POA: Diagnosis not present

## 2019-05-31 DIAGNOSIS — Z951 Presence of aortocoronary bypass graft: Secondary | ICD-10-CM | POA: Diagnosis not present

## 2019-05-31 DIAGNOSIS — D6832 Hemorrhagic disorder due to extrinsic circulating anticoagulants: Secondary | ICD-10-CM | POA: Diagnosis not present

## 2019-05-31 DIAGNOSIS — S81812D Laceration without foreign body, left lower leg, subsequent encounter: Secondary | ICD-10-CM | POA: Diagnosis not present

## 2019-05-31 DIAGNOSIS — I2510 Atherosclerotic heart disease of native coronary artery without angina pectoris: Secondary | ICD-10-CM | POA: Diagnosis not present

## 2019-05-31 DIAGNOSIS — Z9181 History of falling: Secondary | ICD-10-CM | POA: Diagnosis not present

## 2019-05-31 DIAGNOSIS — Z5181 Encounter for therapeutic drug level monitoring: Secondary | ICD-10-CM | POA: Diagnosis not present

## 2019-05-31 DIAGNOSIS — I482 Chronic atrial fibrillation, unspecified: Secondary | ICD-10-CM | POA: Diagnosis not present

## 2019-05-31 DIAGNOSIS — Z7901 Long term (current) use of anticoagulants: Secondary | ICD-10-CM | POA: Diagnosis not present

## 2019-06-05 DIAGNOSIS — I2510 Atherosclerotic heart disease of native coronary artery without angina pectoris: Secondary | ICD-10-CM | POA: Diagnosis not present

## 2019-06-05 DIAGNOSIS — I130 Hypertensive heart and chronic kidney disease with heart failure and stage 1 through stage 4 chronic kidney disease, or unspecified chronic kidney disease: Secondary | ICD-10-CM | POA: Diagnosis not present

## 2019-06-05 DIAGNOSIS — Z950 Presence of cardiac pacemaker: Secondary | ICD-10-CM | POA: Diagnosis not present

## 2019-06-05 DIAGNOSIS — S8012XD Contusion of left lower leg, subsequent encounter: Secondary | ICD-10-CM | POA: Diagnosis not present

## 2019-06-05 DIAGNOSIS — D6832 Hemorrhagic disorder due to extrinsic circulating anticoagulants: Secondary | ICD-10-CM | POA: Diagnosis not present

## 2019-06-05 DIAGNOSIS — Z7951 Long term (current) use of inhaled steroids: Secondary | ICD-10-CM | POA: Diagnosis not present

## 2019-06-05 DIAGNOSIS — Z9181 History of falling: Secondary | ICD-10-CM | POA: Diagnosis not present

## 2019-06-05 DIAGNOSIS — I350 Nonrheumatic aortic (valve) stenosis: Secondary | ICD-10-CM | POA: Diagnosis not present

## 2019-06-05 DIAGNOSIS — W19XXXD Unspecified fall, subsequent encounter: Secondary | ICD-10-CM | POA: Diagnosis not present

## 2019-06-05 DIAGNOSIS — I482 Chronic atrial fibrillation, unspecified: Secondary | ICD-10-CM | POA: Diagnosis not present

## 2019-06-05 DIAGNOSIS — Z7982 Long term (current) use of aspirin: Secondary | ICD-10-CM | POA: Diagnosis not present

## 2019-06-05 DIAGNOSIS — S81812D Laceration without foreign body, left lower leg, subsequent encounter: Secondary | ICD-10-CM | POA: Diagnosis not present

## 2019-06-05 DIAGNOSIS — T45515D Adverse effect of anticoagulants, subsequent encounter: Secondary | ICD-10-CM | POA: Diagnosis not present

## 2019-06-05 DIAGNOSIS — N189 Chronic kidney disease, unspecified: Secondary | ICD-10-CM | POA: Diagnosis not present

## 2019-06-05 DIAGNOSIS — J449 Chronic obstructive pulmonary disease, unspecified: Secondary | ICD-10-CM | POA: Diagnosis not present

## 2019-06-05 DIAGNOSIS — Z5181 Encounter for therapeutic drug level monitoring: Secondary | ICD-10-CM | POA: Diagnosis not present

## 2019-06-05 DIAGNOSIS — Z7901 Long term (current) use of anticoagulants: Secondary | ICD-10-CM | POA: Diagnosis not present

## 2019-06-05 DIAGNOSIS — Z952 Presence of prosthetic heart valve: Secondary | ICD-10-CM | POA: Diagnosis not present

## 2019-06-05 DIAGNOSIS — Z951 Presence of aortocoronary bypass graft: Secondary | ICD-10-CM | POA: Diagnosis not present

## 2019-06-05 DIAGNOSIS — I5032 Chronic diastolic (congestive) heart failure: Secondary | ICD-10-CM | POA: Diagnosis not present

## 2019-06-06 ENCOUNTER — Other Ambulatory Visit: Payer: Self-pay

## 2019-06-06 ENCOUNTER — Encounter: Payer: Self-pay | Admitting: Cardiovascular Disease

## 2019-06-06 ENCOUNTER — Ambulatory Visit (INDEPENDENT_AMBULATORY_CARE_PROVIDER_SITE_OTHER): Payer: Medicare Other | Admitting: Cardiovascular Disease

## 2019-06-06 VITALS — BP 131/69 | HR 61 | Ht 64.0 in | Wt 154.0 lb

## 2019-06-06 DIAGNOSIS — Z953 Presence of xenogenic heart valve: Secondary | ICD-10-CM | POA: Diagnosis not present

## 2019-06-06 DIAGNOSIS — E7800 Pure hypercholesterolemia, unspecified: Secondary | ICD-10-CM

## 2019-06-06 DIAGNOSIS — I10 Essential (primary) hypertension: Secondary | ICD-10-CM | POA: Diagnosis not present

## 2019-06-06 DIAGNOSIS — I350 Nonrheumatic aortic (valve) stenosis: Secondary | ICD-10-CM

## 2019-06-06 DIAGNOSIS — Z5181 Encounter for therapeutic drug level monitoring: Secondary | ICD-10-CM | POA: Diagnosis not present

## 2019-06-06 LAB — CBC WITH DIFFERENTIAL/PLATELET
Basophils Absolute: 0 10*3/uL (ref 0.0–0.2)
Basos: 1 %
EOS (ABSOLUTE): 0.1 10*3/uL (ref 0.0–0.4)
Eos: 2 %
Hematocrit: 38.1 % (ref 34.0–46.6)
Hemoglobin: 12.9 g/dL (ref 11.1–15.9)
Immature Grans (Abs): 0 10*3/uL (ref 0.0–0.1)
Immature Granulocytes: 0 %
Lymphocytes Absolute: 1.4 10*3/uL (ref 0.7–3.1)
Lymphs: 24 %
MCH: 29.5 pg (ref 26.6–33.0)
MCHC: 33.9 g/dL (ref 31.5–35.7)
MCV: 87 fL (ref 79–97)
Monocytes Absolute: 0.5 10*3/uL (ref 0.1–0.9)
Monocytes: 9 %
Neutrophils Absolute: 3.7 10*3/uL (ref 1.4–7.0)
Neutrophils: 64 %
Platelets: 219 10*3/uL (ref 150–450)
RBC: 4.37 x10E6/uL (ref 3.77–5.28)
RDW: 13.6 % (ref 11.7–15.4)
WBC: 5.7 10*3/uL (ref 3.4–10.8)

## 2019-06-06 LAB — LIPID PANEL
Chol/HDL Ratio: 4.2 ratio (ref 0.0–4.4)
Cholesterol, Total: 222 mg/dL — ABNORMAL HIGH (ref 100–199)
HDL: 53 mg/dL (ref >39)
LDL Chol Calc (NIH): 135 mg/dL — ABNORMAL HIGH (ref 0–99)
Triglycerides: 193 mg/dL — ABNORMAL HIGH (ref 0–149)
VLDL Cholesterol Cal: 34 mg/dL (ref 5–40)

## 2019-06-06 LAB — COMPREHENSIVE METABOLIC PANEL
ALT: 23 IU/L (ref 0–32)
AST: 24 IU/L (ref 0–40)
Albumin/Globulin Ratio: 2 (ref 1.2–2.2)
Albumin: 4.5 g/dL (ref 3.6–4.6)
Alkaline Phosphatase: 96 IU/L (ref 39–117)
BUN/Creatinine Ratio: 13 (ref 12–28)
BUN: 11 mg/dL (ref 8–27)
Bilirubin Total: 0.4 mg/dL (ref 0.0–1.2)
CO2: 30 mmol/L — ABNORMAL HIGH (ref 20–29)
Calcium: 9.4 mg/dL (ref 8.7–10.3)
Chloride: 100 mmol/L (ref 96–106)
Creatinine, Ser: 0.87 mg/dL (ref 0.57–1.00)
GFR calc Af Amer: 72 mL/min/{1.73_m2} (ref >59)
GFR calc non Af Amer: 63 mL/min/{1.73_m2} (ref >59)
Globulin, Total: 2.3 g/dL (ref 1.5–4.5)
Glucose: 101 mg/dL — ABNORMAL HIGH (ref 65–99)
Potassium: 3.5 mmol/L (ref 3.5–5.2)
Sodium: 143 mmol/L (ref 134–144)
Total Protein: 6.8 g/dL (ref 6.0–8.5)

## 2019-06-06 NOTE — Patient Instructions (Signed)
Medication Instructions:  Your physician recommends that you continue on your current medications as directed. Please refer to the Current Medication list given to you today.  If you need a refill on your cardiac medications before your next appointment, please call your pharmacy.   Lab work: LP/CMT TODAY   If you have labs (blood work) drawn today and your tests are completely normal, you will receive your results only by: Marland Kitchen MyChart Message (if you have MyChart) OR . A paper copy in the mail If you have any lab test that is abnormal or we need to change your treatment, we will call you to review the results.  Testing/Procedures: Your physician has requested that you have an echocardiogram. Echocardiography is a painless test that uses sound waves to create images of your heart. It provides your doctor with information about the size and shape of your heart and how well your heart's chambers and valves are working. This procedure takes approximately one hour. There are no restrictions for this procedure. [place] [address] [place]   Follow-Up: At [facility], you and your health needs are our priority.  As part of our continuing mission to provide you with exceptional heart care, we have created designated Provider Care Teams.  These Care Teams include your primary Cardiologist (physician) and Advanced Practice Providers (APPs -  Physician Assistants and Nurse Practitioners) who all work together to provide you with the care you need, when you need it. You will need a follow up appointment in 4 months AFTER ECHO.  Please call our office 2 months in advance to schedule this appointment.  You may see [name], MD or one of the following Advanced Practice Providers on your designated Care Team:   Kerin Ransom, PA-C Roby Lofts, [place] . Sande Rives, PA-C

## 2019-06-06 NOTE — Progress Notes (Signed)
Cardiology Office Note   Date:  06/06/2019   ID:  Catherine [name], DOB 1935/07/02, MRN [id number]  PCP:  [name], MD  Cardiologist:   [name], MD  Cardiothoracic surgeon: Dr. Roxy Manns  No chief complaint on file.    History of Present Illness: Catherine [name] is a 84 y.o. 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 Catherine [name] was previously a [name] of [name]. She underwent mitral commissurotomy in Catherine 1970s. On her echocardiogram 02/2014 she had an ejection fraction of 60-65% with moderate to severe mitral regurgitation. There was also mild aortic regurgitation. Her follow-up echo 02/2015 showed an EF of 60-65% with moderate to severe mitral regurgitation, moderate tricuspid regurgitation and elevated pulmonary artery pressures.  Repeat echo 9/11/7 revealed moderate pulmonary stenosis with moderate to severe mitral regurgitation.  She did not have any pulmonary hypertension and her LV was not dilated.  She was referred for left and right heart catheterization. She was found to have severe mitral stenosis with a 13 mmHg mean gradient, MVA 0.99 cm^2. She also had 50% LCx and 80% RCA lesions.  She had a 29 mm bioprosthetic mitral valve, single vessel CABG and MAZE with Dr. Roxy Manns on 10/14/16.  She was referred for a baseline echo 02/15/17 that revealed LVEF 60-65% with mild aortic stenosis (mean gradient 12 mmHg).  Her bioprosthetic mitral valve was functioning well.    After her surgery Catherine [name] continued to report fatigue.  It was unclear whether she was in atrial fibrillation or sinus rhythm with a long PR interval.  She was referred to Catherine atrial fibrillation clinic where this remained unclear.  However, it was felt that if she were in atrial fibrillation it would not be possible to maintain sinus rhythm.  She developed lower  extremity edema. She stopped amlodipine and this improved. This was switched to irbesartan. She then developed diaphoresis and insomnia that she attributed to this medication.    In April 2020 Catherine [name] fell on Catherine treadmill.  She had severe tearing of Catherine skin on her leg.  She had a lot of swelling and has been in termendous pain.  She reports that it has been worse than recovering from her heart surgery. She has home health RNs coming to dress Catherine wound.  From a cardiac standpoint she is been stable.  She denies any lower extremity edema, orthopnea, or PND.  Her breathing has been stable and she has not noted any palpitations.  She has not been as active due to her injury and Catherine coronavirus.  She thinks her cholesterol should be better because she has been working on her diet.  Past Medical History:  Diagnosis Date   Aortic stenosis, mild 07/26/2017   Arthritis    Asthma    last attack 02/2015   Atrial fibrillation, chronic    Cellulitis of left lower extremity 08/17/2016   Ulcer associated with severe venous insufficiency   Chronic anticoagulation    Chronic diastolic CHF (congestive heart failure) ([place])    Chronic kidney disease    "RIGHT MANY KIDNEY INFECTIONS AND STONES"   COPD (chronic obstructive pulmonary disease) ([facility])    Coronary artery disease    Dizziness    H/O: rheumatic fever    Heart murmur    Hypertension    PONV (postoperative nausea and vomiting)    ' SOMETIMES',  BUT NOT ALWAYS"   S/P Maze operation for atrial fibrillation 10/14/2016   Complete bilateral atrial lesion set using cryothermy and bipolar radiofrequency ablation - atrial appendage was not treated due to previous surgical procedure (open mitral commissurotomy)   S/P mitral valve replacement with bioprosthetic valve 10/14/2016   29 mm Medtronic Mosaic porcine bioprosthetic tissue valve   UTI (urinary tract infection)    Valvular heart disease    Has mitral stenosis with prior mitral  commissurotomy in 1970    Past Surgical History:  Procedure Laterality Date   ABDOMINAL HYSTERECTOMY  1983   endometriosis   APPENDECTOMY     BACK SURGERY     neurosurgery x2   CARDIAC CATHETERIZATION     CARDIAC CATHETERIZATION N/A 08/03/2016   Procedure: Right/Left Heart Cath and Coronary Angiography;  Surgeon: Peter M Martinique, MD;  Location: [facility];  Service: Cardiovascular;  Laterality: N/A;   cataract surg     CHOLECYSTECTOMY N/A 05/30/2015   Procedure: LAPAROSCOPIC CHOLECYSTECTOMY WITH INTRAOPERATIVE CHOLANGIOGRAM;  Surgeon: Excell Seltzer, MD;  Location: [facility];  Service: General;  Laterality: N/A;   COLONOSCOPY     CORONARY ARTERY BYPASS GRAFT N/A 10/14/2016   Procedure: CORONARY ARTERY BYPASS GRAFTING (CABG);  Surgeon: Rexene Alberts, MD;  Location: [facility];  Service: Open Heart Surgery;  Laterality: N/A;   EYE SURGERY     MAZE N/A 10/14/2016   Procedure: MAZE;  Surgeon: Rexene Alberts, MD;  Location: [facility];  Service: Open Heart Surgery;  Laterality: N/A;   MITRAL VALVE REPLACEMENT N/A 10/14/2016   Procedure: REDO MITRAL VALVE REPLACEMENT (MVR);  Surgeon: Rexene Alberts, MD;  Location: [facility];  Service: Open Heart Surgery;  Laterality: N/A;   MITRAL VALVE SURGERY Left 1970   Open mitral commissurotomy via left thoracotomy approach   TEE WITHOUT CARDIOVERSION N/A 07/05/2016   Procedure: TRANSESOPHAGEAL ECHOCARDIOGRAM (TEE);  Surgeon: [name], MD;  Location: [facility];  Service: Cardiovascular;  Laterality: N/A;   TEE WITHOUT CARDIOVERSION N/A 10/14/2016   Procedure: TRANSESOPHAGEAL ECHOCARDIOGRAM (TEE);  Surgeon: Rexene Alberts, MD;  Location: [facility];  Service: Open Heart Surgery;  Laterality: N/A;    Current Outpatient Medications  Medication Sig Dispense Refill   acetaminophen (TYLENOL) 500 MG tablet Take 500 mg by mouth every 6 (six) hours as needed for mild pain.     albuterol (PROVENTIL HFA;VENTOLIN HFA) 108 (90 Base) MCG/ACT inhaler  Inhale 2 puffs into Catherine lungs every 6 (six) hours as needed for wheezing or shortness of breath. 1 Inhaler 0   aspirin EC 81 MG EC tablet Take 1 tablet (81 mg total) by mouth daily.     budesonide-formoterol (SYMBICORT) 80-4.5 MCG/ACT inhaler 2 PUFFS 2 TIMES A DAY 10.2 g 5   diazepam (VALIUM) 5 MG tablet TAKE ONE TABLET AT BEDTIME 30 tablet 5   doxazosin (CARDURA) 4 MG tablet Take 1 tablet (4 mg total) by mouth daily. KEEP SEPTMENER APPOINTMENT 40 tablet 0   fluconazole (DIFLUCAN) 150 MG tablet Take 1 tablet by mouth once for 1 dose. 1 tablet 0   furosemide (LASIX) 40 MG tablet Take 1 tablet (40 mg total) by mouth daily. OK to take an extra tablet as needed for swelling. 180 tablet 1   OVER Catherine COUNTER MEDICATION Apply 1 application topically at bedtime as needed (Leg Pain). Magni-Life topical cream for leg pain.     potassium chloride (K-DUR) 10 MEQ tablet Take 2 tablets by mouth twice a day ([name] taking  differently: Take 4 tablets by mouth twice a day) 360 tablet 3   triamterene-hydrochlorothiazide (MAXZIDE-25) 37.5-25 MG tablet Take 1 tablet by mouth daily. 90 tablet 3   warfarin (COUMADIN) 2 MG tablet Take 1 tablet daily except 2 on Mondays or AS DIRECTED BY ANTICOAGULATION CLINIC  90 DAY 105 tablet 1   No current facility-administered medications for this visit.     Allergies:   Aldactone [spironolactone], Amoxicillin, Diltiazem, Flagyl [metronidazole hcl], Flovent [fluticasone propionate], Gabapentin, Lyrica [pregabalin], Quinidine, Simvastatin, Tramadol, Verapamil, Amlodipine, Zetia [ezetimibe], Ace inhibitors, Benazepril hcl, Ciprocin-fluocin-procin [fluocinolone acetonide], Ciprofloxacin, Codeine, and Nitrofurantoin monohyd macro    Social History:  Catherine [name]  reports that she quit smoking about 43 years ago. Her smoking use included cigarettes. She has a 15.00 pack-year smoking history. She has never used smokeless tobacco. She reports that she does not drink alcohol or use  drugs.   Family History:  Catherine [name]'s family history includes Leukemia in her father.    ROS:  Please see Catherine history of present illness.   Otherwise, review of systems are positive for chest wall soreness.   All other systems are reviewed and negative.    PHYSICAL EXAM: VS:  BP 131/69    Pulse 61    Ht 5\' 4"  (1.626 m)    Wt 154 lb (69.9 kg)    SpO2 97%    BMI 26.43 kg/m  , BMI Body mass index is 26.43 kg/m. GENERAL:  Well appearing HEENT: Pupils equal round and reactive, fundi not visualized, oral mucosa unremarkable NECK:  No jugular venous distention, waveform within normal limits, carotid upstroke brisk and symmetric, no bruits, no thyromegaly LYMPHATICS:  No cervical adenopathy LUNGS:  Clear to auscultation bilaterally HEART:  RRR.  PMI not displaced or sustained,S1 and S2 within normal limits, no S3, no S4, no clicks, no rubs, III/VI systolic murmur at Catherine LUSB ABD:  Flat, positive bowel sounds normal in frequency in pitch, no bruits, no rebound, no guarding, no midline pulsatile mass, no hepatomegaly, no splenomegaly EXT:  2 plus pulses throughout, no edema, no cyanosis no clubbing SKIN:  No rashes no nodules NEURO:  Cranial nerves II through XII grossly intact, motor grossly intact throughout PSYCH:  Cognitively intact, oriented to person place and time   EKG:  EKG is ordered today. Catherine ekg ordered 05/21/16 demonstrates atrial fibrillateion.  Prior septal infarct.  Rate 60 bpm.  Unchanged from 10/22/15. 10/27/16: Sinus rhythm.  Rate 60 bpm.  First degree heart block. 02/01/17: Sinus bradycardia with sinus arrhythmia and first degree block.   03/08/17: Sinus bradycardia with sinus arrhythmia.   First degree heart block.  Junctional escape.  QTc 497 ms.   04/26/17: Atrial fibrillation.  Rate 69 bpm. 01/05/18: Accelerated junctional rhythm.  Rate 68 bpm.  Incomplete RBBB.  10/10/18: Sinus rhythm.  Rate 89 bpm.  Nonspecific ST changes.  06/06/19: Atrial fibrillation.  Rate 61 bpm.  Prior  septal infarct.  Echo 07/06/18: Study Conclusions  - Left ventricle: Catherine cavity size was normal. Systolic function was   vigorous. Catherine estimated ejection fraction was in Catherine range of 65%   to 70%. Wall motion was normal; there were no regional wall   motion abnormalities. Doppler parameters are consistent with high   ventricular filling pressure. - Aortic valve: Trileaflet; mildly thickened, mildly calcified   leaflets. There was mild stenosis. Peak velocity (S): 266 cm/s.   Mean gradient (S): 16 mm Hg. - Mitral valve: A bioprosthesis was present and functioning  normally. - Left atrium: Catherine atrium was severely dilated. Anterior-posterior   dimension: 60 mm. - Tricuspid valve: There was mild regurgitation.  LHC/RHC 08/03/16: Catherine left ventricular systolic function is normal.  LV end diastolic pressure is normal.  Catherine left ventricular ejection fraction is 55-65% by visual estimate.  There is no aortic valve stenosis.  There is severe mitral valve stenosis.  Prox Cx to Mid Cx lesion, 50 %stenosed.  Mid RCA-2 lesion, 80 %stenosed.  Mid RCA-1 lesion, 80 %stenosed.  Hemodynamic findings consistent with mild pulmonary hypertension.  LV end diastolic pressure is normal.   1. Coronary artery disease   - 50% mid LCx   - 80% sequential lesions in Catherine mid RCA 2. Normal LV function 3. Severe mitral stenosis. MV gradient of 13 mm Hg. MVA 0.99 cm squared with index 0.57. 4. Moderate to severe mitral insufficiency 5. Mild pulmonary HTN. 6. Normal LV EDP 7. No significant AV gradient 8. Occluded right brachial artery.  Carotid Dopplers 10/12/16: 1-39% bilateral ICA stenosis  7 Day Event Monitor 06/28/17:  Quality: Fair.  Baseline artifact. Predominant rhythm: Sinus rhythm with PACs and sinus arrhythmia.    Cannot  rule out episodes of atrial fibrillation.  Recent Labs: 06/29/2018: NT-Pro BNP 590 06/06/2019: ALT WILL FOLLOW; BUN WILL FOLLOW; Creatinine, Ser WILL FOLLOW;  Hemoglobin 12.9; Platelets 219; Potassium WILL FOLLOW; Sodium WILL FOLLOW    Lipid Panel    Component Value Date/Time   CHOL WILL FOLLOW 06/06/2019 1116   TRIG WILL FOLLOW 06/06/2019 1116   HDL WILL FOLLOW 06/06/2019 1116   CHOLHDL WILL FOLLOW 06/06/2019 1116   CHOLHDL 3 12/20/2016 1112   VLDL 36.8 12/20/2016 1112   LDLCALC 170 (H) 10/10/2018 1142   LDLDIRECT 134.7 08/28/2014 1009      Wt Readings from Last 3 Encounters:  06/06/19 154 lb (69.9 kg)  04/18/19 154 lb 4.8 oz (70 kg)  03/21/19 150 lb 8 oz (68.3 kg)      ASSESSMENT AND PLAN:  # Rheumatic mitral valve disease: # s/p bioprosthetic mitral valve: Catherine [name] is doing well.  She has no evidence of heart failure on exam.  Her volume status is well-controlled.  Continue furosemide.  She needs antibiotics prior to dental procedures.  # Mild aortic stenosis: Mean gradient 16 mmHg on 06/2018.  Repeat echo in January.  # Persistent atrial fibrillation:  # Bradycardia: # Palpitations: Rate well-controlled.  Continue warfarin due to her valvular disease.  Check CBC.  # Hypertension:  BLood pressure is well-controlled on HCTZ-triamterene and doxazosin.  # Nonobstructive coronary disease: # Hyperlipidemia:  Has not tolerated statins and muscle cramps on Zetia.  Check lipids/CMP. Unclear if she is taking Repatha.    Current medicines are reviewed at length with Catherine [name] today.  Catherine [name] does not have concerns regarding medicines.  Catherine following changes have been made:  none  Labs/ tests ordered today include:   Orders Placed This Encounter  Procedures   Lipid panel   Comprehensive metabolic panel   CBC with Differential/Platelet   EKG 12-Lead   ECHOCARDIOGRAM COMPLETE    Disposition:   FU with Maykel Reitter C. Oval Linsey, MD, [facility] in 4 months.    Signed, Romell Wolden C. Oval Linsey, MD, [facility]  06/06/2019 4:09 PM    [facility]

## 2019-06-07 DIAGNOSIS — T45515D Adverse effect of anticoagulants, subsequent encounter: Secondary | ICD-10-CM | POA: Diagnosis not present

## 2019-06-07 DIAGNOSIS — Z951 Presence of aortocoronary bypass graft: Secondary | ICD-10-CM | POA: Diagnosis not present

## 2019-06-07 DIAGNOSIS — I2510 Atherosclerotic heart disease of native coronary artery without angina pectoris: Secondary | ICD-10-CM | POA: Diagnosis not present

## 2019-06-07 DIAGNOSIS — Z7951 Long term (current) use of inhaled steroids: Secondary | ICD-10-CM | POA: Diagnosis not present

## 2019-06-07 DIAGNOSIS — J449 Chronic obstructive pulmonary disease, unspecified: Secondary | ICD-10-CM | POA: Diagnosis not present

## 2019-06-07 DIAGNOSIS — N189 Chronic kidney disease, unspecified: Secondary | ICD-10-CM | POA: Diagnosis not present

## 2019-06-07 DIAGNOSIS — Z7901 Long term (current) use of anticoagulants: Secondary | ICD-10-CM | POA: Diagnosis not present

## 2019-06-07 DIAGNOSIS — Z952 Presence of prosthetic heart valve: Secondary | ICD-10-CM | POA: Diagnosis not present

## 2019-06-07 DIAGNOSIS — I482 Chronic atrial fibrillation, unspecified: Secondary | ICD-10-CM | POA: Diagnosis not present

## 2019-06-07 DIAGNOSIS — Z950 Presence of cardiac pacemaker: Secondary | ICD-10-CM | POA: Diagnosis not present

## 2019-06-07 DIAGNOSIS — W19XXXD Unspecified fall, subsequent encounter: Secondary | ICD-10-CM | POA: Diagnosis not present

## 2019-06-07 DIAGNOSIS — S8012XD Contusion of left lower leg, subsequent encounter: Secondary | ICD-10-CM | POA: Diagnosis not present

## 2019-06-07 DIAGNOSIS — Z9181 History of falling: Secondary | ICD-10-CM | POA: Diagnosis not present

## 2019-06-07 DIAGNOSIS — Z5181 Encounter for therapeutic drug level monitoring: Secondary | ICD-10-CM | POA: Diagnosis not present

## 2019-06-07 DIAGNOSIS — I130 Hypertensive heart and chronic kidney disease with heart failure and stage 1 through stage 4 chronic kidney disease, or unspecified chronic kidney disease: Secondary | ICD-10-CM | POA: Diagnosis not present

## 2019-06-07 DIAGNOSIS — I350 Nonrheumatic aortic (valve) stenosis: Secondary | ICD-10-CM | POA: Diagnosis not present

## 2019-06-07 DIAGNOSIS — S81812D Laceration without foreign body, left lower leg, subsequent encounter: Secondary | ICD-10-CM | POA: Diagnosis not present

## 2019-06-07 DIAGNOSIS — I5032 Chronic diastolic (congestive) heart failure: Secondary | ICD-10-CM | POA: Diagnosis not present

## 2019-06-07 DIAGNOSIS — Z7982 Long term (current) use of aspirin: Secondary | ICD-10-CM | POA: Diagnosis not present

## 2019-06-07 DIAGNOSIS — D6832 Hemorrhagic disorder due to extrinsic circulating anticoagulants: Secondary | ICD-10-CM | POA: Diagnosis not present

## 2019-06-08 ENCOUNTER — Other Ambulatory Visit: Payer: Self-pay | Admitting: Cardiovascular Disease

## 2019-06-11 DIAGNOSIS — S81812D Laceration without foreign body, left lower leg, subsequent encounter: Secondary | ICD-10-CM | POA: Diagnosis not present

## 2019-06-11 DIAGNOSIS — Z7982 Long term (current) use of aspirin: Secondary | ICD-10-CM | POA: Diagnosis not present

## 2019-06-11 DIAGNOSIS — S8012XD Contusion of left lower leg, subsequent encounter: Secondary | ICD-10-CM | POA: Diagnosis not present

## 2019-06-11 DIAGNOSIS — I5032 Chronic diastolic (congestive) heart failure: Secondary | ICD-10-CM | POA: Diagnosis not present

## 2019-06-11 DIAGNOSIS — T45515D Adverse effect of anticoagulants, subsequent encounter: Secondary | ICD-10-CM | POA: Diagnosis not present

## 2019-06-11 DIAGNOSIS — I130 Hypertensive heart and chronic kidney disease with heart failure and stage 1 through stage 4 chronic kidney disease, or unspecified chronic kidney disease: Secondary | ICD-10-CM | POA: Diagnosis not present

## 2019-06-11 DIAGNOSIS — D6832 Hemorrhagic disorder due to extrinsic circulating anticoagulants: Secondary | ICD-10-CM | POA: Diagnosis not present

## 2019-06-11 DIAGNOSIS — Z5181 Encounter for therapeutic drug level monitoring: Secondary | ICD-10-CM | POA: Diagnosis not present

## 2019-06-11 DIAGNOSIS — I482 Chronic atrial fibrillation, unspecified: Secondary | ICD-10-CM | POA: Diagnosis not present

## 2019-06-11 DIAGNOSIS — Z7901 Long term (current) use of anticoagulants: Secondary | ICD-10-CM | POA: Diagnosis not present

## 2019-06-11 DIAGNOSIS — J449 Chronic obstructive pulmonary disease, unspecified: Secondary | ICD-10-CM | POA: Diagnosis not present

## 2019-06-11 DIAGNOSIS — I350 Nonrheumatic aortic (valve) stenosis: Secondary | ICD-10-CM | POA: Diagnosis not present

## 2019-06-11 DIAGNOSIS — N189 Chronic kidney disease, unspecified: Secondary | ICD-10-CM | POA: Diagnosis not present

## 2019-06-11 DIAGNOSIS — Z7951 Long term (current) use of inhaled steroids: Secondary | ICD-10-CM | POA: Diagnosis not present

## 2019-06-11 DIAGNOSIS — Z951 Presence of aortocoronary bypass graft: Secondary | ICD-10-CM | POA: Diagnosis not present

## 2019-06-11 DIAGNOSIS — I2510 Atherosclerotic heart disease of native coronary artery without angina pectoris: Secondary | ICD-10-CM | POA: Diagnosis not present

## 2019-06-11 DIAGNOSIS — Z950 Presence of cardiac pacemaker: Secondary | ICD-10-CM | POA: Diagnosis not present

## 2019-06-11 DIAGNOSIS — Z9181 History of falling: Secondary | ICD-10-CM | POA: Diagnosis not present

## 2019-06-11 DIAGNOSIS — W19XXXD Unspecified fall, subsequent encounter: Secondary | ICD-10-CM | POA: Diagnosis not present

## 2019-06-11 DIAGNOSIS — Z952 Presence of prosthetic heart valve: Secondary | ICD-10-CM | POA: Diagnosis not present

## 2019-06-14 DIAGNOSIS — S8012XD Contusion of left lower leg, subsequent encounter: Secondary | ICD-10-CM | POA: Diagnosis not present

## 2019-06-14 DIAGNOSIS — Z5181 Encounter for therapeutic drug level monitoring: Secondary | ICD-10-CM | POA: Diagnosis not present

## 2019-06-14 DIAGNOSIS — Z950 Presence of cardiac pacemaker: Secondary | ICD-10-CM | POA: Diagnosis not present

## 2019-06-14 DIAGNOSIS — T45515D Adverse effect of anticoagulants, subsequent encounter: Secondary | ICD-10-CM | POA: Diagnosis not present

## 2019-06-14 DIAGNOSIS — Z9181 History of falling: Secondary | ICD-10-CM | POA: Diagnosis not present

## 2019-06-14 DIAGNOSIS — S81812D Laceration without foreign body, left lower leg, subsequent encounter: Secondary | ICD-10-CM | POA: Diagnosis not present

## 2019-06-14 DIAGNOSIS — I5032 Chronic diastolic (congestive) heart failure: Secondary | ICD-10-CM | POA: Diagnosis not present

## 2019-06-14 DIAGNOSIS — D6832 Hemorrhagic disorder due to extrinsic circulating anticoagulants: Secondary | ICD-10-CM | POA: Diagnosis not present

## 2019-06-14 DIAGNOSIS — I2510 Atherosclerotic heart disease of native coronary artery without angina pectoris: Secondary | ICD-10-CM | POA: Diagnosis not present

## 2019-06-14 DIAGNOSIS — I350 Nonrheumatic aortic (valve) stenosis: Secondary | ICD-10-CM | POA: Diagnosis not present

## 2019-06-14 DIAGNOSIS — Z951 Presence of aortocoronary bypass graft: Secondary | ICD-10-CM | POA: Diagnosis not present

## 2019-06-14 DIAGNOSIS — J449 Chronic obstructive pulmonary disease, unspecified: Secondary | ICD-10-CM | POA: Diagnosis not present

## 2019-06-14 DIAGNOSIS — W19XXXD Unspecified fall, subsequent encounter: Secondary | ICD-10-CM | POA: Diagnosis not present

## 2019-06-14 DIAGNOSIS — Z952 Presence of prosthetic heart valve: Secondary | ICD-10-CM | POA: Diagnosis not present

## 2019-06-14 DIAGNOSIS — Z7951 Long term (current) use of inhaled steroids: Secondary | ICD-10-CM | POA: Diagnosis not present

## 2019-06-14 DIAGNOSIS — I130 Hypertensive heart and chronic kidney disease with heart failure and stage 1 through stage 4 chronic kidney disease, or unspecified chronic kidney disease: Secondary | ICD-10-CM | POA: Diagnosis not present

## 2019-06-14 DIAGNOSIS — Z7901 Long term (current) use of anticoagulants: Secondary | ICD-10-CM | POA: Diagnosis not present

## 2019-06-14 DIAGNOSIS — Z7982 Long term (current) use of aspirin: Secondary | ICD-10-CM | POA: Diagnosis not present

## 2019-06-14 DIAGNOSIS — N189 Chronic kidney disease, unspecified: Secondary | ICD-10-CM | POA: Diagnosis not present

## 2019-06-14 DIAGNOSIS — I482 Chronic atrial fibrillation, unspecified: Secondary | ICD-10-CM | POA: Diagnosis not present

## 2019-06-18 DIAGNOSIS — Z952 Presence of prosthetic heart valve: Secondary | ICD-10-CM | POA: Diagnosis not present

## 2019-06-18 DIAGNOSIS — Z9181 History of falling: Secondary | ICD-10-CM | POA: Diagnosis not present

## 2019-06-18 DIAGNOSIS — Z7901 Long term (current) use of anticoagulants: Secondary | ICD-10-CM | POA: Diagnosis not present

## 2019-06-18 DIAGNOSIS — I130 Hypertensive heart and chronic kidney disease with heart failure and stage 1 through stage 4 chronic kidney disease, or unspecified chronic kidney disease: Secondary | ICD-10-CM | POA: Diagnosis not present

## 2019-06-18 DIAGNOSIS — Z7951 Long term (current) use of inhaled steroids: Secondary | ICD-10-CM | POA: Diagnosis not present

## 2019-06-18 DIAGNOSIS — T45515D Adverse effect of anticoagulants, subsequent encounter: Secondary | ICD-10-CM | POA: Diagnosis not present

## 2019-06-18 DIAGNOSIS — I482 Chronic atrial fibrillation, unspecified: Secondary | ICD-10-CM | POA: Diagnosis not present

## 2019-06-18 DIAGNOSIS — Z950 Presence of cardiac pacemaker: Secondary | ICD-10-CM | POA: Diagnosis not present

## 2019-06-18 DIAGNOSIS — Z951 Presence of aortocoronary bypass graft: Secondary | ICD-10-CM | POA: Diagnosis not present

## 2019-06-18 DIAGNOSIS — S81812D Laceration without foreign body, left lower leg, subsequent encounter: Secondary | ICD-10-CM | POA: Diagnosis not present

## 2019-06-18 DIAGNOSIS — S8012XD Contusion of left lower leg, subsequent encounter: Secondary | ICD-10-CM | POA: Diagnosis not present

## 2019-06-18 DIAGNOSIS — W19XXXD Unspecified fall, subsequent encounter: Secondary | ICD-10-CM | POA: Diagnosis not present

## 2019-06-18 DIAGNOSIS — J449 Chronic obstructive pulmonary disease, unspecified: Secondary | ICD-10-CM | POA: Diagnosis not present

## 2019-06-18 DIAGNOSIS — I5032 Chronic diastolic (congestive) heart failure: Secondary | ICD-10-CM | POA: Diagnosis not present

## 2019-06-18 DIAGNOSIS — Z5181 Encounter for therapeutic drug level monitoring: Secondary | ICD-10-CM | POA: Diagnosis not present

## 2019-06-18 DIAGNOSIS — I350 Nonrheumatic aortic (valve) stenosis: Secondary | ICD-10-CM | POA: Diagnosis not present

## 2019-06-18 DIAGNOSIS — I2510 Atherosclerotic heart disease of native coronary artery without angina pectoris: Secondary | ICD-10-CM | POA: Diagnosis not present

## 2019-06-18 DIAGNOSIS — D6832 Hemorrhagic disorder due to extrinsic circulating anticoagulants: Secondary | ICD-10-CM | POA: Diagnosis not present

## 2019-06-18 DIAGNOSIS — Z7982 Long term (current) use of aspirin: Secondary | ICD-10-CM | POA: Diagnosis not present

## 2019-06-18 DIAGNOSIS — N189 Chronic kidney disease, unspecified: Secondary | ICD-10-CM | POA: Diagnosis not present

## 2019-06-20 ENCOUNTER — Ambulatory Visit (INDEPENDENT_AMBULATORY_CARE_PROVIDER_SITE_OTHER): Payer: Medicare Other | Admitting: General Practice

## 2019-06-20 ENCOUNTER — Other Ambulatory Visit: Payer: Self-pay

## 2019-06-20 DIAGNOSIS — I4891 Unspecified atrial fibrillation: Secondary | ICD-10-CM

## 2019-06-20 LAB — POCT INR: INR: 2.3 (ref 2.0–3.0)

## 2019-06-20 NOTE — Patient Instructions (Addendum)
Pre visit review using our clinic review tool, if applicable. No additional management support is needed unless otherwise documented below in the visit note.  Continue to take 1 tablet daily except 2 tablets only on Mondays.  Re-check in 6 weeks.

## 2019-06-21 DIAGNOSIS — T45515D Adverse effect of anticoagulants, subsequent encounter: Secondary | ICD-10-CM | POA: Diagnosis not present

## 2019-06-21 DIAGNOSIS — I5032 Chronic diastolic (congestive) heart failure: Secondary | ICD-10-CM | POA: Diagnosis not present

## 2019-06-21 DIAGNOSIS — D6832 Hemorrhagic disorder due to extrinsic circulating anticoagulants: Secondary | ICD-10-CM | POA: Diagnosis not present

## 2019-06-21 DIAGNOSIS — Z5181 Encounter for therapeutic drug level monitoring: Secondary | ICD-10-CM | POA: Diagnosis not present

## 2019-06-21 DIAGNOSIS — I482 Chronic atrial fibrillation, unspecified: Secondary | ICD-10-CM | POA: Diagnosis not present

## 2019-06-21 DIAGNOSIS — W19XXXD Unspecified fall, subsequent encounter: Secondary | ICD-10-CM | POA: Diagnosis not present

## 2019-06-21 DIAGNOSIS — S81812D Laceration without foreign body, left lower leg, subsequent encounter: Secondary | ICD-10-CM | POA: Diagnosis not present

## 2019-06-21 DIAGNOSIS — I2510 Atherosclerotic heart disease of native coronary artery without angina pectoris: Secondary | ICD-10-CM | POA: Diagnosis not present

## 2019-06-21 DIAGNOSIS — Z9181 History of falling: Secondary | ICD-10-CM | POA: Diagnosis not present

## 2019-06-21 DIAGNOSIS — S8012XD Contusion of left lower leg, subsequent encounter: Secondary | ICD-10-CM | POA: Diagnosis not present

## 2019-06-21 DIAGNOSIS — Z952 Presence of prosthetic heart valve: Secondary | ICD-10-CM | POA: Diagnosis not present

## 2019-06-21 DIAGNOSIS — Z7951 Long term (current) use of inhaled steroids: Secondary | ICD-10-CM | POA: Diagnosis not present

## 2019-06-21 DIAGNOSIS — Z7982 Long term (current) use of aspirin: Secondary | ICD-10-CM | POA: Diagnosis not present

## 2019-06-21 DIAGNOSIS — Z950 Presence of cardiac pacemaker: Secondary | ICD-10-CM | POA: Diagnosis not present

## 2019-06-21 DIAGNOSIS — Z7901 Long term (current) use of anticoagulants: Secondary | ICD-10-CM | POA: Diagnosis not present

## 2019-06-21 DIAGNOSIS — Z951 Presence of aortocoronary bypass graft: Secondary | ICD-10-CM | POA: Diagnosis not present

## 2019-06-21 DIAGNOSIS — N189 Chronic kidney disease, unspecified: Secondary | ICD-10-CM | POA: Diagnosis not present

## 2019-06-21 DIAGNOSIS — I130 Hypertensive heart and chronic kidney disease with heart failure and stage 1 through stage 4 chronic kidney disease, or unspecified chronic kidney disease: Secondary | ICD-10-CM | POA: Diagnosis not present

## 2019-06-21 DIAGNOSIS — I350 Nonrheumatic aortic (valve) stenosis: Secondary | ICD-10-CM | POA: Diagnosis not present

## 2019-06-21 DIAGNOSIS — J449 Chronic obstructive pulmonary disease, unspecified: Secondary | ICD-10-CM | POA: Diagnosis not present

## 2019-06-22 ENCOUNTER — Telehealth: Payer: Self-pay | Admitting: Cardiovascular Disease

## 2019-06-22 NOTE — Telephone Encounter (Signed)
Routed to primary nurse 

## 2019-06-22 NOTE — Telephone Encounter (Signed)
Patient is calling for lab results.

## 2019-06-25 DIAGNOSIS — Z7951 Long term (current) use of inhaled steroids: Secondary | ICD-10-CM | POA: Diagnosis not present

## 2019-06-25 DIAGNOSIS — I2510 Atherosclerotic heart disease of native coronary artery without angina pectoris: Secondary | ICD-10-CM | POA: Diagnosis not present

## 2019-06-25 DIAGNOSIS — Z7901 Long term (current) use of anticoagulants: Secondary | ICD-10-CM | POA: Diagnosis not present

## 2019-06-25 DIAGNOSIS — Z5181 Encounter for therapeutic drug level monitoring: Secondary | ICD-10-CM | POA: Diagnosis not present

## 2019-06-25 DIAGNOSIS — Z952 Presence of prosthetic heart valve: Secondary | ICD-10-CM | POA: Diagnosis not present

## 2019-06-25 DIAGNOSIS — Z951 Presence of aortocoronary bypass graft: Secondary | ICD-10-CM | POA: Diagnosis not present

## 2019-06-25 DIAGNOSIS — N189 Chronic kidney disease, unspecified: Secondary | ICD-10-CM | POA: Diagnosis not present

## 2019-06-25 DIAGNOSIS — I5032 Chronic diastolic (congestive) heart failure: Secondary | ICD-10-CM | POA: Diagnosis not present

## 2019-06-25 DIAGNOSIS — I130 Hypertensive heart and chronic kidney disease with heart failure and stage 1 through stage 4 chronic kidney disease, or unspecified chronic kidney disease: Secondary | ICD-10-CM | POA: Diagnosis not present

## 2019-06-25 DIAGNOSIS — J449 Chronic obstructive pulmonary disease, unspecified: Secondary | ICD-10-CM | POA: Diagnosis not present

## 2019-06-25 DIAGNOSIS — I482 Chronic atrial fibrillation, unspecified: Secondary | ICD-10-CM | POA: Diagnosis not present

## 2019-06-25 DIAGNOSIS — S81812D Laceration without foreign body, left lower leg, subsequent encounter: Secondary | ICD-10-CM | POA: Diagnosis not present

## 2019-06-25 DIAGNOSIS — T45515D Adverse effect of anticoagulants, subsequent encounter: Secondary | ICD-10-CM | POA: Diagnosis not present

## 2019-06-25 DIAGNOSIS — S80812D Abrasion, left lower leg, subsequent encounter: Secondary | ICD-10-CM | POA: Diagnosis not present

## 2019-06-25 DIAGNOSIS — D6832 Hemorrhagic disorder due to extrinsic circulating anticoagulants: Secondary | ICD-10-CM | POA: Diagnosis not present

## 2019-06-25 DIAGNOSIS — Z9181 History of falling: Secondary | ICD-10-CM | POA: Diagnosis not present

## 2019-06-25 DIAGNOSIS — Z950 Presence of cardiac pacemaker: Secondary | ICD-10-CM | POA: Diagnosis not present

## 2019-06-25 DIAGNOSIS — I350 Nonrheumatic aortic (valve) stenosis: Secondary | ICD-10-CM | POA: Diagnosis not present

## 2019-06-25 DIAGNOSIS — S8012XD Contusion of left lower leg, subsequent encounter: Secondary | ICD-10-CM | POA: Diagnosis not present

## 2019-06-25 DIAGNOSIS — W19XXXD Unspecified fall, subsequent encounter: Secondary | ICD-10-CM | POA: Diagnosis not present

## 2019-06-25 DIAGNOSIS — Z7982 Long term (current) use of aspirin: Secondary | ICD-10-CM | POA: Diagnosis not present

## 2019-06-28 DIAGNOSIS — L821 Other seborrheic keratosis: Secondary | ICD-10-CM | POA: Diagnosis not present

## 2019-06-28 DIAGNOSIS — D225 Melanocytic nevi of trunk: Secondary | ICD-10-CM | POA: Diagnosis not present

## 2019-06-28 DIAGNOSIS — L814 Other melanin hyperpigmentation: Secondary | ICD-10-CM | POA: Diagnosis not present

## 2019-06-28 DIAGNOSIS — L853 Xerosis cutis: Secondary | ICD-10-CM | POA: Diagnosis not present

## 2019-06-28 DIAGNOSIS — D692 Other nonthrombocytopenic purpura: Secondary | ICD-10-CM | POA: Diagnosis not present

## 2019-06-28 DIAGNOSIS — L570 Actinic keratosis: Secondary | ICD-10-CM | POA: Diagnosis not present

## 2019-06-29 DIAGNOSIS — Z951 Presence of aortocoronary bypass graft: Secondary | ICD-10-CM | POA: Diagnosis not present

## 2019-06-29 DIAGNOSIS — S8012XD Contusion of left lower leg, subsequent encounter: Secondary | ICD-10-CM | POA: Diagnosis not present

## 2019-06-29 DIAGNOSIS — N189 Chronic kidney disease, unspecified: Secondary | ICD-10-CM | POA: Diagnosis not present

## 2019-06-29 DIAGNOSIS — I5032 Chronic diastolic (congestive) heart failure: Secondary | ICD-10-CM | POA: Diagnosis not present

## 2019-06-29 DIAGNOSIS — Z7982 Long term (current) use of aspirin: Secondary | ICD-10-CM | POA: Diagnosis not present

## 2019-06-29 DIAGNOSIS — I130 Hypertensive heart and chronic kidney disease with heart failure and stage 1 through stage 4 chronic kidney disease, or unspecified chronic kidney disease: Secondary | ICD-10-CM | POA: Diagnosis not present

## 2019-06-29 DIAGNOSIS — I350 Nonrheumatic aortic (valve) stenosis: Secondary | ICD-10-CM | POA: Diagnosis not present

## 2019-06-29 DIAGNOSIS — J449 Chronic obstructive pulmonary disease, unspecified: Secondary | ICD-10-CM | POA: Diagnosis not present

## 2019-06-29 DIAGNOSIS — Z5181 Encounter for therapeutic drug level monitoring: Secondary | ICD-10-CM | POA: Diagnosis not present

## 2019-06-29 DIAGNOSIS — I482 Chronic atrial fibrillation, unspecified: Secondary | ICD-10-CM | POA: Diagnosis not present

## 2019-06-29 DIAGNOSIS — D6832 Hemorrhagic disorder due to extrinsic circulating anticoagulants: Secondary | ICD-10-CM | POA: Diagnosis not present

## 2019-06-29 DIAGNOSIS — Z952 Presence of prosthetic heart valve: Secondary | ICD-10-CM | POA: Diagnosis not present

## 2019-06-29 DIAGNOSIS — Z7901 Long term (current) use of anticoagulants: Secondary | ICD-10-CM | POA: Diagnosis not present

## 2019-06-29 DIAGNOSIS — T45515D Adverse effect of anticoagulants, subsequent encounter: Secondary | ICD-10-CM | POA: Diagnosis not present

## 2019-06-29 DIAGNOSIS — I2510 Atherosclerotic heart disease of native coronary artery without angina pectoris: Secondary | ICD-10-CM | POA: Diagnosis not present

## 2019-06-29 DIAGNOSIS — Z950 Presence of cardiac pacemaker: Secondary | ICD-10-CM | POA: Diagnosis not present

## 2019-06-29 DIAGNOSIS — S81812D Laceration without foreign body, left lower leg, subsequent encounter: Secondary | ICD-10-CM | POA: Diagnosis not present

## 2019-06-29 DIAGNOSIS — Z9181 History of falling: Secondary | ICD-10-CM | POA: Diagnosis not present

## 2019-06-29 DIAGNOSIS — Z7951 Long term (current) use of inhaled steroids: Secondary | ICD-10-CM | POA: Diagnosis not present

## 2019-06-29 DIAGNOSIS — W19XXXD Unspecified fall, subsequent encounter: Secondary | ICD-10-CM | POA: Diagnosis not present

## 2019-07-02 DIAGNOSIS — I5032 Chronic diastolic (congestive) heart failure: Secondary | ICD-10-CM | POA: Diagnosis not present

## 2019-07-02 DIAGNOSIS — Z5181 Encounter for therapeutic drug level monitoring: Secondary | ICD-10-CM | POA: Diagnosis not present

## 2019-07-02 DIAGNOSIS — Z950 Presence of cardiac pacemaker: Secondary | ICD-10-CM | POA: Diagnosis not present

## 2019-07-02 DIAGNOSIS — I130 Hypertensive heart and chronic kidney disease with heart failure and stage 1 through stage 4 chronic kidney disease, or unspecified chronic kidney disease: Secondary | ICD-10-CM | POA: Diagnosis not present

## 2019-07-02 DIAGNOSIS — I2510 Atherosclerotic heart disease of native coronary artery without angina pectoris: Secondary | ICD-10-CM | POA: Diagnosis not present

## 2019-07-02 DIAGNOSIS — W19XXXD Unspecified fall, subsequent encounter: Secondary | ICD-10-CM | POA: Diagnosis not present

## 2019-07-02 DIAGNOSIS — Z7982 Long term (current) use of aspirin: Secondary | ICD-10-CM | POA: Diagnosis not present

## 2019-07-02 DIAGNOSIS — S81812D Laceration without foreign body, left lower leg, subsequent encounter: Secondary | ICD-10-CM | POA: Diagnosis not present

## 2019-07-02 DIAGNOSIS — D6832 Hemorrhagic disorder due to extrinsic circulating anticoagulants: Secondary | ICD-10-CM | POA: Diagnosis not present

## 2019-07-02 DIAGNOSIS — Z7901 Long term (current) use of anticoagulants: Secondary | ICD-10-CM | POA: Diagnosis not present

## 2019-07-02 DIAGNOSIS — Z7951 Long term (current) use of inhaled steroids: Secondary | ICD-10-CM | POA: Diagnosis not present

## 2019-07-02 DIAGNOSIS — Z952 Presence of prosthetic heart valve: Secondary | ICD-10-CM | POA: Diagnosis not present

## 2019-07-02 DIAGNOSIS — D3132 Benign neoplasm of left choroid: Secondary | ICD-10-CM | POA: Diagnosis not present

## 2019-07-02 DIAGNOSIS — S8012XD Contusion of left lower leg, subsequent encounter: Secondary | ICD-10-CM | POA: Diagnosis not present

## 2019-07-02 DIAGNOSIS — Z951 Presence of aortocoronary bypass graft: Secondary | ICD-10-CM | POA: Diagnosis not present

## 2019-07-02 DIAGNOSIS — Z9181 History of falling: Secondary | ICD-10-CM | POA: Diagnosis not present

## 2019-07-02 DIAGNOSIS — I482 Chronic atrial fibrillation, unspecified: Secondary | ICD-10-CM | POA: Diagnosis not present

## 2019-07-02 DIAGNOSIS — I350 Nonrheumatic aortic (valve) stenosis: Secondary | ICD-10-CM | POA: Diagnosis not present

## 2019-07-02 DIAGNOSIS — H35071 Retinal telangiectasis, right eye: Secondary | ICD-10-CM | POA: Diagnosis not present

## 2019-07-02 DIAGNOSIS — Z961 Presence of intraocular lens: Secondary | ICD-10-CM | POA: Diagnosis not present

## 2019-07-02 DIAGNOSIS — T45515D Adverse effect of anticoagulants, subsequent encounter: Secondary | ICD-10-CM | POA: Diagnosis not present

## 2019-07-02 DIAGNOSIS — H43813 Vitreous degeneration, bilateral: Secondary | ICD-10-CM | POA: Diagnosis not present

## 2019-07-02 DIAGNOSIS — J449 Chronic obstructive pulmonary disease, unspecified: Secondary | ICD-10-CM | POA: Diagnosis not present

## 2019-07-02 DIAGNOSIS — N189 Chronic kidney disease, unspecified: Secondary | ICD-10-CM | POA: Diagnosis not present

## 2019-07-02 DIAGNOSIS — H26493 Other secondary cataract, bilateral: Secondary | ICD-10-CM | POA: Diagnosis not present

## 2019-07-03 NOTE — Telephone Encounter (Signed)
Reviewed labs with patient, see result note

## 2019-07-05 DIAGNOSIS — Z7951 Long term (current) use of inhaled steroids: Secondary | ICD-10-CM | POA: Diagnosis not present

## 2019-07-05 DIAGNOSIS — D6832 Hemorrhagic disorder due to extrinsic circulating anticoagulants: Secondary | ICD-10-CM | POA: Diagnosis not present

## 2019-07-05 DIAGNOSIS — I482 Chronic atrial fibrillation, unspecified: Secondary | ICD-10-CM | POA: Diagnosis not present

## 2019-07-05 DIAGNOSIS — T45515D Adverse effect of anticoagulants, subsequent encounter: Secondary | ICD-10-CM | POA: Diagnosis not present

## 2019-07-05 DIAGNOSIS — Z950 Presence of cardiac pacemaker: Secondary | ICD-10-CM | POA: Diagnosis not present

## 2019-07-05 DIAGNOSIS — Z951 Presence of aortocoronary bypass graft: Secondary | ICD-10-CM | POA: Diagnosis not present

## 2019-07-05 DIAGNOSIS — W19XXXD Unspecified fall, subsequent encounter: Secondary | ICD-10-CM | POA: Diagnosis not present

## 2019-07-05 DIAGNOSIS — Z7982 Long term (current) use of aspirin: Secondary | ICD-10-CM | POA: Diagnosis not present

## 2019-07-05 DIAGNOSIS — Z9181 History of falling: Secondary | ICD-10-CM | POA: Diagnosis not present

## 2019-07-05 DIAGNOSIS — I2510 Atherosclerotic heart disease of native coronary artery without angina pectoris: Secondary | ICD-10-CM | POA: Diagnosis not present

## 2019-07-05 DIAGNOSIS — Z952 Presence of prosthetic heart valve: Secondary | ICD-10-CM | POA: Diagnosis not present

## 2019-07-05 DIAGNOSIS — I5032 Chronic diastolic (congestive) heart failure: Secondary | ICD-10-CM | POA: Diagnosis not present

## 2019-07-05 DIAGNOSIS — J449 Chronic obstructive pulmonary disease, unspecified: Secondary | ICD-10-CM | POA: Diagnosis not present

## 2019-07-05 DIAGNOSIS — S8012XD Contusion of left lower leg, subsequent encounter: Secondary | ICD-10-CM | POA: Diagnosis not present

## 2019-07-05 DIAGNOSIS — I130 Hypertensive heart and chronic kidney disease with heart failure and stage 1 through stage 4 chronic kidney disease, or unspecified chronic kidney disease: Secondary | ICD-10-CM | POA: Diagnosis not present

## 2019-07-05 DIAGNOSIS — Z5181 Encounter for therapeutic drug level monitoring: Secondary | ICD-10-CM | POA: Diagnosis not present

## 2019-07-05 DIAGNOSIS — Z7901 Long term (current) use of anticoagulants: Secondary | ICD-10-CM | POA: Diagnosis not present

## 2019-07-05 DIAGNOSIS — I350 Nonrheumatic aortic (valve) stenosis: Secondary | ICD-10-CM | POA: Diagnosis not present

## 2019-07-05 DIAGNOSIS — N189 Chronic kidney disease, unspecified: Secondary | ICD-10-CM | POA: Diagnosis not present

## 2019-07-05 DIAGNOSIS — S81812D Laceration without foreign body, left lower leg, subsequent encounter: Secondary | ICD-10-CM | POA: Diagnosis not present

## 2019-07-07 ENCOUNTER — Encounter: Payer: Self-pay | Admitting: Family Medicine

## 2019-07-07 ENCOUNTER — Telehealth (INDEPENDENT_AMBULATORY_CARE_PROVIDER_SITE_OTHER): Payer: Medicare Other | Admitting: Family Medicine

## 2019-07-07 ENCOUNTER — Other Ambulatory Visit: Payer: Self-pay

## 2019-07-07 DIAGNOSIS — R300 Dysuria: Secondary | ICD-10-CM

## 2019-07-07 MED ORDER — FLUCONAZOLE 150 MG PO TABS: ORAL_TABLET | ORAL | 0 refills | Status: DC

## 2019-07-07 MED ORDER — CEPHALEXIN 500 MG PO CAPS: 500.0000 mg | ORAL_CAPSULE | Freq: Two times a day (BID) | ORAL | 0 refills | Status: DC

## 2019-07-07 MED ORDER — POTASSIUM CHLORIDE ER 10 MEQ PO TBCR: EXTENDED_RELEASE_TABLET | ORAL | Status: DC

## 2019-07-07 NOTE — Assessment & Plan Note (Signed)
Dysuria.  Presumed UTI.  Given the pandemic it is likely safer to treat preemptively instead of having patient go to UC at this point.  She agrees.  She can tolerate keflex.  Will start keflex, can use diflucan if needed and she will call PCP Monday 07/09/2019 about f/u and especially about INR recheck.  She agrees/understood.  Routine cautions given in the meantime.

## 2019-07-07 NOTE — Progress Notes (Signed)
Interactive audio and video telecommunications were attempted between this provider and patient, however failed, due to patient having technical difficulties OR patient did not have access to video capability.  We continued and completed visit with audio only.   Virtual Visit via Telephone Note  I connected with patient on 07/07/19  at 10:26 AM  by telephone and verified that I am speaking with the correct person using two identifiers.  Location of patient: home  Location of MD: [place] Name of referring provider (if blank then none associated): Names per persons and role in encounter:  MD: Earlyne Iba, Patient: name listed above.    I discussed the limitations, risks, security and privacy concerns of performing an evaluation and management service by telephone and the availability of in person appointments. I also discussed with the patient that there may be a patient responsible charge related to this service. The patient expressed understanding and agreed to proceed.  CC: dysuria.    History of Present Illness:  Burning with urination, only with urination, started about 5AM today.  No FCNAVD.  She has some intermittent lower back pain over the last few weeks but she attributed that to positional changes with leg elevation.    She has tolerated keflex w/o ADE other than yeast infections.    Observations/Objective: nad Speech wnl  Assessment and Plan: Dysuria.  Presumed UTI.  Given the pandemic it is likely safer to treat preemptively instead of having patient go to UC at this point.  She agrees.  She can tolerate keflex.  Will start keflex, can use diflucan if needed and she will call PCP Monday 07/09/2019 about f/u and especially about INR recheck.  She agrees/understood.  Routine cautions given in the meantime.   Follow Up Instructions: see above .   I discussed the assessment and treatment plan with the patient. The patient was provided an opportunity to ask questions  and all were answered. The patient agreed with the plan and demonstrated an understanding of the instructions.   The patient was advised to call back or seek an in-person evaluation if the symptoms worsen or if the condition fails to improve as anticipated.  I provided 15 minutes of non-face-to-face time during this encounter.   Elsie Stain, MD

## 2019-07-09 ENCOUNTER — Encounter: Payer: Self-pay | Admitting: Family Medicine

## 2019-07-09 ENCOUNTER — Ambulatory Visit
Admission: RE | Admit: 2019-07-09 | Discharge: 2019-07-09 | Disposition: A | Discharge status: Home / Self Care | Payer: Medicare Other | Source: Ambulatory Visit | Attending: Family Medicine | Admitting: Family Medicine

## 2019-07-09 ENCOUNTER — Ambulatory Visit (INDEPENDENT_AMBULATORY_CARE_PROVIDER_SITE_OTHER): Payer: Medicare Other | Admitting: Family Medicine

## 2019-07-09 ENCOUNTER — Other Ambulatory Visit: Payer: Self-pay

## 2019-07-09 VITALS — BP 124/72 | HR 100 | Temp 98.1°F | Resp 16 | Ht 64.0 in | Wt 153.8 lb

## 2019-07-09 DIAGNOSIS — Z1231 Encounter for screening mammogram for malignant neoplasm of breast: Secondary | ICD-10-CM

## 2019-07-09 DIAGNOSIS — H6123 Impacted cerumen, bilateral: Secondary | ICD-10-CM

## 2019-07-09 DIAGNOSIS — S81802D Unspecified open wound, left lower leg, subsequent encounter: Secondary | ICD-10-CM

## 2019-07-09 DIAGNOSIS — R300 Dysuria: Secondary | ICD-10-CM | POA: Diagnosis not present

## 2019-07-09 LAB — POC URINALSYSI DIPSTICK (AUTOMATED)
Bilirubin, UA: NEGATIVE
Blood, UA: NEGATIVE
Glucose, UA: NEGATIVE
Ketones, UA: NEGATIVE
Leukocytes, UA: NEGATIVE
Nitrite, UA: NEGATIVE
Protein, UA: NEGATIVE
Spec Grav, UA: 1.015 (ref 1.010–1.025)
Urobilinogen, UA: 0.2 E.U./dL
pH, UA: 8 (ref 5.0–8.0)

## 2019-07-09 NOTE — Progress Notes (Signed)
Subjective:     Patient ID: Catherine [name], female   DOB: [birth year], 84 y.o.   MRN: [id number]  HPI  Seen today for multiple issues as follows  Over the weekend she developed some urine frequency and burning with urination.  Her burning occurred only with urination and this was noted Saturday morning.  She had virtual visit will start on Keflex 500 mg twice daily.  She had preceding history of 3 weeks of low back pain prior to this but no flank pain.  No fevers or chills.  No gross hematuria.  Symptoms have improved somewhat already.  Past history of frequent UTIs  She is on chronic Coumadin.  She is due for next INR early November..  She had complicated left leg injury following a fall on treadmill at home several months ago.  Still has home health coming out 2 times per week.  They are redressing her wound which is almost completely healed at this point.  She has bilateral ear fullness and some decreased hearing.  History of cerumen buildup in the past.  Past Medical History:  Diagnosis Date  . Aortic stenosis, mild 07/26/2017  . Arthritis   . Asthma    last attack 02/2015  . Atrial fibrillation, chronic ([place])   . Cellulitis of left lower extremity 08/17/2016   Ulcer associated with severe venous insufficiency  . Chronic anticoagulation   . Chronic diastolic CHF (congestive heart failure) ([place])   . Chronic kidney disease    "RIGHT MANY KIDNEY INFECTIONS AND STONES"  . COPD (chronic obstructive pulmonary disease) ([place])   . Coronary artery disease   . Dizziness   . H/O: rheumatic fever   . Heart murmur   . Hypertension   . PONV (postoperative nausea and vomiting)    ' SOMETIMES', BUT NOT ALWAYS"  . S/P Maze operation for atrial fibrillation 10/14/2016   Complete bilateral atrial lesion set using cryothermy and bipolar radiofrequency ablation - atrial appendage was not treated due to previous surgical procedure (open mitral commissurotomy)  . S/P mitral valve replacement with  bioprosthetic valve 10/14/2016   29 mm Medtronic Mosaic porcine bioprosthetic tissue valve  . UTI (urinary tract infection)   . Valvular heart disease    Has mitral stenosis with prior mitral commissurotomy in 1970   Past Surgical History:  Procedure Laterality Date  . ABDOMINAL HYSTERECTOMY  1983   endometriosis  . APPENDECTOMY    . BACK SURGERY     neurosurgery x2  . CARDIAC CATHETERIZATION    . CARDIAC CATHETERIZATION N/A 08/03/2016   Procedure: Right/Left Heart Cath and Coronary Angiography;  Surgeon: Peter M Martinique, MD;  Location: [facility];  Service: Cardiovascular;  Laterality: N/A;  . cataract surg    . CHOLECYSTECTOMY N/A 05/30/2015   Procedure: LAPAROSCOPIC CHOLECYSTECTOMY WITH INTRAOPERATIVE CHOLANGIOGRAM;  Surgeon: Excell Seltzer, MD;  Location: [facility];  Service: General;  Laterality: N/A;  . COLONOSCOPY    . CORONARY ARTERY BYPASS GRAFT N/A 10/14/2016   Procedure: CORONARY ARTERY BYPASS GRAFTING (CABG);  Surgeon: Rexene Alberts, MD;  Location: [facility];  Service: Open Heart Surgery;  Laterality: N/A;  . EYE SURGERY    . MAZE N/A 10/14/2016   Procedure: MAZE;  Surgeon: Rexene Alberts, MD;  Location: [facility];  Service: Open Heart Surgery;  Laterality: N/A;  . MITRAL VALVE REPLACEMENT N/A 10/14/2016   Procedure: REDO MITRAL VALVE REPLACEMENT (MVR);  Surgeon: Rexene Alberts, MD;  Location: [facility];  Service: Open Heart  Surgery;  Laterality: N/A;  . MITRAL VALVE SURGERY Left 1970   Open mitral commissurotomy via left thoracotomy approach  . TEE WITHOUT CARDIOVERSION N/A 07/05/2016   Procedure: TRANSESOPHAGEAL ECHOCARDIOGRAM (TEE);  Surgeon: [name], MD;  Location: [facility];  Service: Cardiovascular;  Laterality: N/A;  . TEE WITHOUT CARDIOVERSION N/A 10/14/2016   Procedure: TRANSESOPHAGEAL ECHOCARDIOGRAM (TEE);  Surgeon: Rexene Alberts, MD;  Location: [facility];  Service: Open Heart Surgery;  Laterality: N/A;    reports that she quit smoking about 43 years ago. Her  smoking use included cigarettes. She has a 15.00 pack-year smoking history. She has never used smokeless tobacco. She reports that she does not drink alcohol or use drugs. family history includes Leukemia in her father. Allergies  Allergen Reactions  . Aldactone [Spironolactone] Other (See Comments)    dyspnea  . Amoxicillin Palpitations    Tachycardia Has patient had a PCN reaction causing immediate rash, facial/tongue/throat swelling, SOB or lightheadedness with hypotension: no Has patient had a PCN reaction causing severe rash involving mucus membranes or skin necrosis: {no Has patient had a PCN reaction that required hospitalization {no Has patient had a PCN reaction occurring within the last 10 years: {yes If all of the above answers are "NO", then may proceed with Cephalosporin use.  . Diltiazem Other (See Comments)    Causing headaches   . Flagyl [Metronidazole Hcl] Other (See Comments)    Causing headaches    . Flovent [Fluticasone Propionate] Other (See Comments)    Leg cramps  . Gabapentin Swelling  . Lyrica [Pregabalin] Swelling  . Quinidine Diarrhea and Other (See Comments)    Fever diarrhea  . Simvastatin Other (See Comments)    Leg pain, myalgia  . Tramadol Nausea Only  . Verapamil Other (See Comments)    myalgias  . Amlodipine     Low extremity edema  . Zetia [Ezetimibe]     LEG CRAMPS   . Ace Inhibitors Other (See Comments)    unknown  . Benazepril Hcl Cough  . Ciprocin-Fluocin-Procin [Fluocinolone Acetonide] Other (See Comments)    unknown  . Ciprofloxacin Diarrhea  . Codeine Nausea Only  . Nitrofurantoin Monohyd Macro Nausea Only    Review of Systems  Constitutional: Negative for chills, fatigue and fever.  Eyes: Negative for visual disturbance.  Respiratory: Negative for cough, chest tightness, shortness of breath and wheezing.   Cardiovascular: Negative for chest pain, palpitations and leg swelling.  Genitourinary: Positive for dysuria.   Neurological: Negative for dizziness, seizures, syncope, weakness, light-headedness and headaches.       Objective:   Physical Exam Constitutional:      Appearance: She is well-developed.  HENT:     Ears:     Comments: Bilateral cerumen impactions Eyes:     Pupils: Pupils are equal, round, and reactive to light.  Neck:     Musculoskeletal: Neck supple.     Thyroid: No thyromegaly.     Vascular: No JVD.  Cardiovascular:     Rate and Rhythm: Normal rate.     Heart sounds: No gallop.   Pulmonary:     Effort: Pulmonary effort is normal. No respiratory distress.     Breath sounds: Normal breath sounds. No wheezing or rales.  Skin:    Comments: Very small open wound left anterior leg which is only about 1/2 cm diameter at this point.  Very superficial.  No signs of secondary infection  Neurological:     Mental Status: She is alert.  Assessment:     #1 recent urinary symptoms suggesting possible UTI.  Urine dipstick today is completely normal but may reflect recent initiation of antibiotics  #2 open leg wound left leg following injury slow to heal but is gradually improving  #3 bilateral cerumen impactions    Plan:     -Finish out Keflex.  We elected not to culture since her urine dipstick was normal and since she is already been on antibiotics for a couple of days  -Irrigation of both ears.  We discussed risk of irrigation of ear canals including risk of pain, bleeding, perforation.  Patient consented left canal was completely cleared with assistance of curette.  She had impacted cerumen in the right canal and had some mild pain with irrigation and we did not attempt any further irrigation. -We have suggested that she try over-the-counter Cerumenex or Debrox followed by irrigation with rubber bulb syringe with lukewarm water  -Continue home health follow-up regarding her leg wound.  We did apply a dressing to this with Telfa and Coban  [name] MD [place]  Primary Care at [facility]

## 2019-07-09 NOTE — Patient Instructions (Signed)
Try some Debrox or Cerumenex wax softener and leave in for about 30 minutes and then irrigate out with lukewarm water with rubber bulb syringe.

## 2019-07-10 DIAGNOSIS — J449 Chronic obstructive pulmonary disease, unspecified: Secondary | ICD-10-CM | POA: Diagnosis not present

## 2019-07-10 DIAGNOSIS — Z952 Presence of prosthetic heart valve: Secondary | ICD-10-CM | POA: Diagnosis not present

## 2019-07-10 DIAGNOSIS — Z951 Presence of aortocoronary bypass graft: Secondary | ICD-10-CM | POA: Diagnosis not present

## 2019-07-10 DIAGNOSIS — I130 Hypertensive heart and chronic kidney disease with heart failure and stage 1 through stage 4 chronic kidney disease, or unspecified chronic kidney disease: Secondary | ICD-10-CM | POA: Diagnosis not present

## 2019-07-10 DIAGNOSIS — Z7951 Long term (current) use of inhaled steroids: Secondary | ICD-10-CM | POA: Diagnosis not present

## 2019-07-10 DIAGNOSIS — I2510 Atherosclerotic heart disease of native coronary artery without angina pectoris: Secondary | ICD-10-CM | POA: Diagnosis not present

## 2019-07-10 DIAGNOSIS — S81812D Laceration without foreign body, left lower leg, subsequent encounter: Secondary | ICD-10-CM | POA: Diagnosis not present

## 2019-07-10 DIAGNOSIS — W19XXXD Unspecified fall, subsequent encounter: Secondary | ICD-10-CM | POA: Diagnosis not present

## 2019-07-10 DIAGNOSIS — I350 Nonrheumatic aortic (valve) stenosis: Secondary | ICD-10-CM | POA: Diagnosis not present

## 2019-07-10 DIAGNOSIS — Z5181 Encounter for therapeutic drug level monitoring: Secondary | ICD-10-CM | POA: Diagnosis not present

## 2019-07-10 DIAGNOSIS — Z7901 Long term (current) use of anticoagulants: Secondary | ICD-10-CM | POA: Diagnosis not present

## 2019-07-10 DIAGNOSIS — Z9181 History of falling: Secondary | ICD-10-CM | POA: Diagnosis not present

## 2019-07-10 DIAGNOSIS — T45515D Adverse effect of anticoagulants, subsequent encounter: Secondary | ICD-10-CM | POA: Diagnosis not present

## 2019-07-10 DIAGNOSIS — I5032 Chronic diastolic (congestive) heart failure: Secondary | ICD-10-CM | POA: Diagnosis not present

## 2019-07-10 DIAGNOSIS — Z7982 Long term (current) use of aspirin: Secondary | ICD-10-CM | POA: Diagnosis not present

## 2019-07-10 DIAGNOSIS — D6832 Hemorrhagic disorder due to extrinsic circulating anticoagulants: Secondary | ICD-10-CM | POA: Diagnosis not present

## 2019-07-10 DIAGNOSIS — N189 Chronic kidney disease, unspecified: Secondary | ICD-10-CM | POA: Diagnosis not present

## 2019-07-10 DIAGNOSIS — I482 Chronic atrial fibrillation, unspecified: Secondary | ICD-10-CM | POA: Diagnosis not present

## 2019-07-10 DIAGNOSIS — Z950 Presence of cardiac pacemaker: Secondary | ICD-10-CM | POA: Diagnosis not present

## 2019-07-10 DIAGNOSIS — S8012XD Contusion of left lower leg, subsequent encounter: Secondary | ICD-10-CM | POA: Diagnosis not present

## 2019-07-13 ENCOUNTER — Other Ambulatory Visit: Payer: Self-pay

## 2019-07-13 ENCOUNTER — Encounter: Payer: Self-pay | Admitting: Family Medicine

## 2019-07-13 ENCOUNTER — Ambulatory Visit: Payer: Medicare Other | Admitting: Family Medicine

## 2019-07-13 ENCOUNTER — Ambulatory Visit (INDEPENDENT_AMBULATORY_CARE_PROVIDER_SITE_OTHER): Payer: Medicare Other | Admitting: Family Medicine

## 2019-07-13 VITALS — BP 122/70 | HR 72 | Temp 98.1°F | Wt 154.2 lb

## 2019-07-13 DIAGNOSIS — I5032 Chronic diastolic (congestive) heart failure: Secondary | ICD-10-CM | POA: Diagnosis not present

## 2019-07-13 DIAGNOSIS — T45515D Adverse effect of anticoagulants, subsequent encounter: Secondary | ICD-10-CM | POA: Diagnosis not present

## 2019-07-13 DIAGNOSIS — J449 Chronic obstructive pulmonary disease, unspecified: Secondary | ICD-10-CM | POA: Diagnosis not present

## 2019-07-13 DIAGNOSIS — Z951 Presence of aortocoronary bypass graft: Secondary | ICD-10-CM | POA: Diagnosis not present

## 2019-07-13 DIAGNOSIS — W19XXXD Unspecified fall, subsequent encounter: Secondary | ICD-10-CM | POA: Diagnosis not present

## 2019-07-13 DIAGNOSIS — Z5181 Encounter for therapeutic drug level monitoring: Secondary | ICD-10-CM | POA: Diagnosis not present

## 2019-07-13 DIAGNOSIS — Z7982 Long term (current) use of aspirin: Secondary | ICD-10-CM | POA: Diagnosis not present

## 2019-07-13 DIAGNOSIS — H6121 Impacted cerumen, right ear: Secondary | ICD-10-CM

## 2019-07-13 DIAGNOSIS — I2510 Atherosclerotic heart disease of native coronary artery without angina pectoris: Secondary | ICD-10-CM | POA: Diagnosis not present

## 2019-07-13 DIAGNOSIS — S8012XD Contusion of left lower leg, subsequent encounter: Secondary | ICD-10-CM | POA: Diagnosis not present

## 2019-07-13 DIAGNOSIS — Z7901 Long term (current) use of anticoagulants: Secondary | ICD-10-CM | POA: Diagnosis not present

## 2019-07-13 DIAGNOSIS — I350 Nonrheumatic aortic (valve) stenosis: Secondary | ICD-10-CM | POA: Diagnosis not present

## 2019-07-13 DIAGNOSIS — S81812D Laceration without foreign body, left lower leg, subsequent encounter: Secondary | ICD-10-CM | POA: Diagnosis not present

## 2019-07-13 DIAGNOSIS — I482 Chronic atrial fibrillation, unspecified: Secondary | ICD-10-CM | POA: Diagnosis not present

## 2019-07-13 DIAGNOSIS — N189 Chronic kidney disease, unspecified: Secondary | ICD-10-CM | POA: Diagnosis not present

## 2019-07-13 DIAGNOSIS — I130 Hypertensive heart and chronic kidney disease with heart failure and stage 1 through stage 4 chronic kidney disease, or unspecified chronic kidney disease: Secondary | ICD-10-CM | POA: Diagnosis not present

## 2019-07-13 DIAGNOSIS — Z7951 Long term (current) use of inhaled steroids: Secondary | ICD-10-CM | POA: Diagnosis not present

## 2019-07-13 DIAGNOSIS — Z952 Presence of prosthetic heart valve: Secondary | ICD-10-CM | POA: Diagnosis not present

## 2019-07-13 DIAGNOSIS — D6832 Hemorrhagic disorder due to extrinsic circulating anticoagulants: Secondary | ICD-10-CM | POA: Diagnosis not present

## 2019-07-13 DIAGNOSIS — Z9181 History of falling: Secondary | ICD-10-CM | POA: Diagnosis not present

## 2019-07-13 DIAGNOSIS — Z950 Presence of cardiac pacemaker: Secondary | ICD-10-CM | POA: Diagnosis not present

## 2019-07-13 NOTE — Progress Notes (Signed)
Subjective:     Patient ID: Catherine [name], female   DOB: [birth year], 84 y.o.   MRN: [id number]  HPI   Catherine [name] is here with fullness of the right ear.  She had the left cleaned out the other day.  She had some pain with right irrigation and this was not able to be completed.  She has in the meantime was tried Debrox on a few week occasions with hopes this would soften up her earwax.  She has had some decreased hearing in the right ear.  No dizziness.  Past Medical History:  Diagnosis Date  . Aortic stenosis, mild 07/26/2017  . Arthritis   . Asthma    last attack 02/2015  . Atrial fibrillation, chronic ([place])   . Cellulitis of left lower extremity 08/17/2016   Ulcer associated with severe venous insufficiency  . Chronic anticoagulation   . Chronic diastolic CHF (congestive heart failure) ([place])   . Chronic kidney disease    "RIGHT MANY KIDNEY INFECTIONS AND STONES"  . COPD (chronic obstructive pulmonary disease) ([place])   . Coronary artery disease   . Dizziness   . H/O: rheumatic fever   . Heart murmur   . Hypertension   . PONV (postoperative nausea and vomiting)    ' SOMETIMES', BUT NOT ALWAYS"  . S/P Maze operation for atrial fibrillation 10/14/2016   Complete bilateral atrial lesion set using cryothermy and bipolar radiofrequency ablation - atrial appendage was not treated due to previous surgical procedure (open mitral commissurotomy)  . S/P mitral valve replacement with bioprosthetic valve 10/14/2016   29 mm Medtronic Mosaic porcine bioprosthetic tissue valve  . UTI (urinary tract infection)   . Valvular heart disease    Has mitral stenosis with prior mitral commissurotomy in 1970   Past Surgical History:  Procedure Laterality Date  . ABDOMINAL HYSTERECTOMY  1983   endometriosis  . APPENDECTOMY    . BACK SURGERY     neurosurgery x2  . CARDIAC CATHETERIZATION    . CARDIAC CATHETERIZATION N/A 08/03/2016   Procedure: Right/Left Heart Cath and Coronary Angiography;  Surgeon:  Peter M Martinique, MD;  Location: [facility];  Service: Cardiovascular;  Laterality: N/A;  . cataract surg    . CHOLECYSTECTOMY N/A 05/30/2015   Procedure: LAPAROSCOPIC CHOLECYSTECTOMY WITH INTRAOPERATIVE CHOLANGIOGRAM;  Surgeon: Excell Seltzer, MD;  Location: [facility];  Service: General;  Laterality: N/A;  . COLONOSCOPY    . CORONARY ARTERY BYPASS GRAFT N/A 10/14/2016   Procedure: CORONARY ARTERY BYPASS GRAFTING (CABG);  Surgeon: Rexene Alberts, MD;  Location: [facility];  Service: Open Heart Surgery;  Laterality: N/A;  . EYE SURGERY    . MAZE N/A 10/14/2016   Procedure: MAZE;  Surgeon: Rexene Alberts, MD;  Location: [facility];  Service: Open Heart Surgery;  Laterality: N/A;  . MITRAL VALVE REPLACEMENT N/A 10/14/2016   Procedure: REDO MITRAL VALVE REPLACEMENT (MVR);  Surgeon: Rexene Alberts, MD;  Location: [facility];  Service: Open Heart Surgery;  Laterality: N/A;  . MITRAL VALVE SURGERY Left 1970   Open mitral commissurotomy via left thoracotomy approach  . TEE WITHOUT CARDIOVERSION N/A 07/05/2016   Procedure: TRANSESOPHAGEAL ECHOCARDIOGRAM (TEE);  Surgeon: [name], MD;  Location: [facility];  Service: Cardiovascular;  Laterality: N/A;  . TEE WITHOUT CARDIOVERSION N/A 10/14/2016   Procedure: TRANSESOPHAGEAL ECHOCARDIOGRAM (TEE);  Surgeon: Rexene Alberts, MD;  Location: [facility];  Service: Open Heart Surgery;  Laterality: N/A;    reports that she quit smoking about 43  years ago. Her smoking use included cigarettes. She has a 15.00 pack-year smoking history. She has never used smokeless tobacco. She reports that she does not drink alcohol or use drugs. family history includes Leukemia in her father. Allergies  Allergen Reactions  . Aldactone [Spironolactone] Other (See Comments)    dyspnea  . Amoxicillin Palpitations    Tachycardia Has patient had a PCN reaction causing immediate rash, facial/tongue/throat swelling, SOB or lightheadedness with hypotension: no Has patient had a PCN reaction  causing severe rash involving mucus membranes or skin necrosis: {no Has patient had a PCN reaction that required hospitalization {no Has patient had a PCN reaction occurring within the last 10 years: {yes If all of the above answers are "NO", then may proceed with Cephalosporin use.  . Diltiazem Other (See Comments)    Causing headaches   . Flagyl [Metronidazole Hcl] Other (See Comments)    Causing headaches    . Flovent [Fluticasone Propionate] Other (See Comments)    Leg cramps  . Gabapentin Swelling  . Lyrica [Pregabalin] Swelling  . Quinidine Diarrhea and Other (See Comments)    Fever diarrhea  . Simvastatin Other (See Comments)    Leg pain, myalgia  . Tramadol Nausea Only  . Verapamil Other (See Comments)    myalgias  . Amlodipine     Low extremity edema  . Zetia [Ezetimibe]     LEG CRAMPS   . Ace Inhibitors Other (See Comments)    unknown  . Benazepril Hcl Cough  . Ciprocin-Fluocin-Procin [Fluocinolone Acetonide] Other (See Comments)    unknown  . Ciprofloxacin Diarrhea  . Codeine Nausea Only  . Nitrofurantoin Monohyd Macro Nausea Only     Review of Systems  Constitutional: Negative for chills and fever.  HENT: Positive for hearing loss. Negative for ear discharge and ear pain.   Respiratory: Negative for cough.   Neurological: Negative for dizziness.       Objective:   Physical Exam Constitutional:      Appearance: Normal appearance.  HENT:     Ears:     Comments: No cerumen left canal.  Left eardrum appears normal.  Minimal bruising on the inferior canal on the left side.  She has cerumen impaction right canal Cardiovascular:     Rate and Rhythm: Normal rate.  Pulmonary:     Effort: Pulmonary effort is normal.     Breath sounds: Normal breath sounds.  Neurological:     Mental Status: She is alert.        Assessment:     Cerumen impaction right canal    Plan:     -Recommend irrigation.  Discussed risk including pain, bleeding, eardrum  perforation and patient consents -irrigated out and pt tolerated well.  [name] MD [place] [facility] at [facility]

## 2019-07-13 NOTE — Patient Instructions (Signed)
Earwax Buildup, Adult The ears produce a substance called earwax that helps keep bacteria out of the ear and protects the skin in the ear canal. Occasionally, earwax can build up in the ear and cause discomfort or hearing loss. What increases the risk? This condition is more likely to develop in people who:  Are female.  Are elderly.  Naturally produce more earwax.  Clean their ears often with cotton swabs.  Use earplugs often.  Use in-ear headphones often.  Wear hearing aids.  Have narrow ear canals.  Have earwax that is overly thick or sticky.  Have eczema.  Are dehydrated.  Have excess hair in the ear canal. What are the signs or symptoms? Symptoms of this condition include:  Reduced or muffled hearing.  A feeling of fullness in the ear or feeling that the ear is plugged.  Fluid coming from the ear.  Ear pain.  Ear itch.  Ringing in the ear.  Coughing.  An obvious piece of earwax that can be seen inside the ear canal. How is this diagnosed? This condition may be diagnosed based on:  Your symptoms.  Your medical history.  An ear exam. During the exam, your health care provider will look into your ear with an instrument called an otoscope. You may have tests, including a hearing test. How is this treated? This condition may be treated by:  Using ear drops to soften the earwax.  Having the earwax removed by a health care provider. The health care provider may: ? Flush the ear with water. ? Use an instrument that has a loop on the end (curette). ? Use a suction device.  Surgery to remove the wax buildup. This may be done in severe cases. Follow these instructions at home:   Take over-the-counter and prescription medicines only as told by your health care provider.  Do not put any objects, including cotton swabs, into your ear. You can clean the opening of your ear canal with a washcloth or facial tissue.  Follow instructions from your health care  provider about cleaning your ears. Do not over-clean your ears.  Drink enough fluid to keep your urine clear or pale yellow. This will help to thin the earwax.  Keep all follow-up visits as told by your health care provider. If earwax builds up in your ears often or if you use hearing aids, consider seeing your health care provider for routine, preventive ear cleanings. Ask your health care provider how often you should schedule your cleanings.  If you have hearing aids, clean them according to instructions from the manufacturer and your health care provider. Contact a health care provider if:  You have ear pain.  You develop a fever.  You have blood, pus, or other fluid coming from your ear.  You have hearing loss.  You have ringing in your ears that does not go away.  Your symptoms do not improve with treatment.  You feel like the room is spinning (vertigo). Summary  Earwax can build up in the ear and cause discomfort or hearing loss.  The most common symptoms of this condition include reduced or muffled hearing and a feeling of fullness in the ear or feeling that the ear is plugged.  This condition may be diagnosed based on your symptoms, your medical history, and an ear exam.  This condition may be treated by using ear drops to soften the earwax or by having the earwax removed by a health care provider.  Do not put any   objects, including cotton swabs, into your ear. You can clean the opening of your ear canal with a washcloth or facial tissue. This information is not intended to replace advice given to you by your health care provider. Make sure you discuss any questions you have with your health care provider. Document Released: 10/21/2004 Document Revised: 08/26/2017 Document Reviewed: 11/24/2016 Elsevier Patient Education  2020 [employer]  

## 2019-07-16 ENCOUNTER — Other Ambulatory Visit: Payer: Self-pay | Admitting: Cardiovascular Disease

## 2019-07-16 ENCOUNTER — Other Ambulatory Visit: Payer: Self-pay | Admitting: Internal Medicine

## 2019-07-16 ENCOUNTER — Ambulatory Visit (INDEPENDENT_AMBULATORY_CARE_PROVIDER_SITE_OTHER): Payer: Medicare Other | Admitting: Internal Medicine

## 2019-07-16 ENCOUNTER — Other Ambulatory Visit: Payer: Self-pay

## 2019-07-16 ENCOUNTER — Encounter: Payer: Self-pay | Admitting: Internal Medicine

## 2019-07-16 DIAGNOSIS — D6832 Hemorrhagic disorder due to extrinsic circulating anticoagulants: Secondary | ICD-10-CM | POA: Diagnosis not present

## 2019-07-16 DIAGNOSIS — I130 Hypertensive heart and chronic kidney disease with heart failure and stage 1 through stage 4 chronic kidney disease, or unspecified chronic kidney disease: Secondary | ICD-10-CM | POA: Diagnosis not present

## 2019-07-16 DIAGNOSIS — Z7901 Long term (current) use of anticoagulants: Secondary | ICD-10-CM | POA: Diagnosis not present

## 2019-07-16 DIAGNOSIS — Z9181 History of falling: Secondary | ICD-10-CM | POA: Diagnosis not present

## 2019-07-16 DIAGNOSIS — W19XXXD Unspecified fall, subsequent encounter: Secondary | ICD-10-CM | POA: Diagnosis not present

## 2019-07-16 DIAGNOSIS — T45515D Adverse effect of anticoagulants, subsequent encounter: Secondary | ICD-10-CM | POA: Diagnosis not present

## 2019-07-16 DIAGNOSIS — I482 Chronic atrial fibrillation, unspecified: Secondary | ICD-10-CM | POA: Diagnosis not present

## 2019-07-16 DIAGNOSIS — J449 Chronic obstructive pulmonary disease, unspecified: Secondary | ICD-10-CM

## 2019-07-16 DIAGNOSIS — N189 Chronic kidney disease, unspecified: Secondary | ICD-10-CM | POA: Diagnosis not present

## 2019-07-16 DIAGNOSIS — I350 Nonrheumatic aortic (valve) stenosis: Secondary | ICD-10-CM | POA: Diagnosis not present

## 2019-07-16 DIAGNOSIS — Z952 Presence of prosthetic heart valve: Secondary | ICD-10-CM | POA: Diagnosis not present

## 2019-07-16 DIAGNOSIS — S81812D Laceration without foreign body, left lower leg, subsequent encounter: Secondary | ICD-10-CM | POA: Diagnosis not present

## 2019-07-16 DIAGNOSIS — Z950 Presence of cardiac pacemaker: Secondary | ICD-10-CM | POA: Diagnosis not present

## 2019-07-16 DIAGNOSIS — Z951 Presence of aortocoronary bypass graft: Secondary | ICD-10-CM | POA: Diagnosis not present

## 2019-07-16 DIAGNOSIS — I2510 Atherosclerotic heart disease of native coronary artery without angina pectoris: Secondary | ICD-10-CM | POA: Diagnosis not present

## 2019-07-16 DIAGNOSIS — Z7982 Long term (current) use of aspirin: Secondary | ICD-10-CM | POA: Diagnosis not present

## 2019-07-16 DIAGNOSIS — I5032 Chronic diastolic (congestive) heart failure: Secondary | ICD-10-CM | POA: Diagnosis not present

## 2019-07-16 DIAGNOSIS — Z5181 Encounter for therapeutic drug level monitoring: Secondary | ICD-10-CM | POA: Diagnosis not present

## 2019-07-16 DIAGNOSIS — S8012XD Contusion of left lower leg, subsequent encounter: Secondary | ICD-10-CM | POA: Diagnosis not present

## 2019-07-16 DIAGNOSIS — Z7951 Long term (current) use of inhaled steroids: Secondary | ICD-10-CM | POA: Diagnosis not present

## 2019-07-16 MED ORDER — BUDESONIDE-FORMOTEROL FUMARATE 80-4.5 MCG/ACT IN AERO: INHALATION_SPRAY | RESPIRATORY_TRACT | 6 refills | Status: DC

## 2019-07-16 NOTE — Assessment & Plan Note (Signed)
Quit smoking 1977 - Spirometry 03/24/15  FEV1  1.41 (70%) ratio 65  - 03/26/2015  Walked RA x 3 laps @ 185 ft each stopped due to  End of study, brisk pace, no sob or desat  - Spirometry 07/29/2016  FEV1 1.30 (66%)  Ratio 66   - Spirometry 12/20/2017  FEV1 1.34 (69%)  Ratio 71 with min curvature p am symb 80 and saba w/in 1 hour - 12/20/2017  Walked RA x 3 laps @ 185 ft each stopped due to  End of study, moderately fast pace, no sob or desat     - 12/20/2017  After extensive coaching inhaler device  effectiveness =    75%   Doing great with adls/ walks to mb s exac or excess need for saba over what would be required to control AB component so no change in rx needed   Advised:  formulary restrictions will be an ongoing challenge for the forseable future and I would be happy to pick an alternative if the pt will first  provide me a list of them -  pt  will need to return here for training for any new device that is required eg dpi vs hfa vs respimat.    In the meantime we can always provide samples so that the patient never runs out of any needed respiratory medications.    >>> f/u in 6 m, sooner prn

## 2019-07-16 NOTE — Progress Notes (Signed)
Subjective:     Patient ID: Catherine [name], female   DOB: 11/20/1934,   MRN: [id number]    Brief patient profile:  76  yowf quit smoking in 1977 with GOLD II criteria 6/29016  at her best can do 5 days/7 walk x one half mile 3.2 mph flat but not doing as well since spring 2016 then admitted p flare of copd with new kidney stone/GB pain    Admit date: 03/15/2015 Discharge date: 03/17/2015    Discharge Condition: stable Diet recommendation: low fat, low sodium  Discharge Diagnoses:  Principal Problem:  Left flank pain/ Nephrolithiasis  Right upper quadrant pain-chronic cholecystitis  Essential hypertension  Atrial fibrillation, chronic  Warfarin-induced coagulopathy  Valvular heart disease  COPD (chronic obstructive pulmonary disease)  History of present illness:  -84 year old female who was discharged from the hospital on 6/17 after being treated for a COPD exacerbation. She returned to the hospital on 6/18 with severe left-sided flank pain. The pain eventually moved to left anterior abdomen and then the groin and then resolved. A CT scan of the abdomen and pelvis was done which revealed a forniceal rupture and a 3 mm calculus at the left UV junction or just inside the bladder. This was consistent with her presentation and at this time she has likely passed the stone. The CT scan above incidentally found chronic large gallstone. Further imaging with the ultrasound revealed acute cholecystitis with a 2.6 cm gallstone and possible additional stones. The patient is admitted to intermittent right upper quadrant pain but no significant issues with vomiting or diarrhea.  Hospital Course:  Principal Problem:  Acute cholecystitis? -As mentioned above, CT scan revealed a large gallstone and ultrasound was suspicious of acute cholecystitis -HIDA negative for acute cholecystitis therefore she does not currently have acute cholecystitis however surgery does suspect she has a chronic  cholecystitis as she does complain of having intermittent right upper quadrant pain for the past couple of months -Surgery (Dr Excell Seltzer) recommending surgery at a later date for chronic cholecystitis and is asking for medical clearance for surgery  Active Problems: Left sided nephrolithiasis with forniceal rupture-discussed with urology -no further management necessary as her pain has resolved indicating passage of the stone  COPD flare -Was admitted for this and discharged on the 17th -has not been hypoxic or wheezing during the hospital stay but continues to have a cough with yellow sputum   Essential hypertension -Atenolol and Maxzide held on admission as she was bradycardic and has acute renal failure - ARF resolved- can resume Losartan   Atrial fibrillation, chronic -she was bradycardic-Atenolol was on hold - HR was in 50-60 range- had a-fib with HR in low 100s for almost 5 min this AM- will resume Atenolol at 12.5 mg daily- cont to hold Digoxin   Warfarin-induced coagulopathy -CHADS Vasc 4 -INR 3.57 on admission - Coumadin was hold for possible surgery- INR 1.49- no surgery planned for this admission- can go back on coumadin takes 2 mg on add days except Wed when she takes 1mg - advised to go back to 2 mg daily and have INR checked in 3 days by PCP   Moderate to severe mitral regurgitation -Unchanged from prior echo in 2015   03/24/2015 1st  Pulmonary office visit/ [name] / pre op consultation  Chief Complaint  Patient presents with  . HFU    Breathing has improved. Her cough has improved some, but she still occ coughs up yellow sputum.   at baseline using saba twice  weekly then yardwrok on   03/07/15  then hackiing 6/11 and admit   03/11/15 with copd/ab  Still some am cough but otherwise back to baseline on symb 80 2bid  Not limited by breathing from desired activities  rec Continue symbicort 80 Take 2 puffs first thing in am and then another 2 puffs about 12 hours later  indefinitely since you had an attack and proair didn't help Only use your albuterol (proair) as a rescue medication You are cleared for gall bladder surgery > did  Fine     07/29/2016  Pulmonary consultation /[name] re: unexplained sob in pt with MS/ GOLD II criteria copd  Chief Complaint  Patient presents with  . Pulmonary Consult    Referred by Dr. Oval Linsey. Pt states that she is going to have mitral valve replacement soon. She c/o increased SOB since her last visit in 2016. She gets SOB doing housework.     progressive doe x year not able to trigger hfa effectively (at least a one second delay)  Previously able to do half mile on a treadmill  X  3.5 mph and flat now  2.2 x 0.2 of a mile then stops due to sob occ orthopnea/ maybe once a month Always recovers within a few minutes   rec Plan A = Automatic = Symbicort 80 Take 2 puffs first thing in am and then another 2 puffs about 12 hours later.  Work on inhaler technique:  - late add  If really needs hfa may be required to use spacer as could never trigger the mdi at onset of insp, only > 1 sec prior Plan B = Backup Only use your albuterol as rescue    Oct 14 2016  MVR [name]> marked improvement in doe   Phone call  12/16/17 Dr MR She saw ppc [name], MD 12/13/17 for fatigue She called cardiology 12/16/2017 for dyspnea for a week and then asked to call here  Please take prednisone 40 mg x1 day, then 30 mg x1 day, then 20 mg x1 day, then 10 mg x1 day, and then 5 mg x1 day and stop      12/20/2017 acute extended ov/[name] re:  ? aecopd  Chief Complaint  Patient presents with  . Acute Visit    Increased SOB since 12/10/17.  She states she gets SOB "anytime" sometimes with just talking. She was prescribe pred taper and this helped some.   sob at rest s relief from albuterol  "like my insides are quivering"  - symptoms better with walking (see walk today)  Some better with prednisone No cough/ some sensation of strangling on water   Changed from full diazepam 5 mg at hs to one half around 3 weeks prior to onset of "quivering" and has really not slept well at all since this change was made ? Around 11/24/17  - not having noct cough / wheeze or early am exac rec Try back on a whole diazepam now and one at bedtime just for a week to see to what extent your problem improves and if worsens again on the half pill after that and if so return to see Dr Elease Hashimoto and if not return here with all meds in hand  Plan A = Automatic = symbicort 80 Take 2 puffs first thing in am and then another 2 puffs about 12 hours later.  Try adding prilosec otc 20mg   Take 30-60 min before first meal of the day and Pepcid ac (famotidine)  20 mg one @  bedtime  X 4 week trial  GERD diet    Plan B = Backup Only use your albuterol as a rescue medication    Virtual Visit via Telephone Note 07/16/2019   I connected with Catherine [name] on 07/16/19 at 12:00 PM EDT by telephone and verified that I am speaking with the correct person using two identifiers.   I discussed the limitations, risks, security and privacy concerns of performing an evaluation and management service by telephone and the availability of in person appointments. I also discussed with the patient that there may be a patient responsible charge related to this service. The patient expressed understanding and agreed to proceed.   History of Present Illness:  GOLD II / maint sym 80 2bid for ab component / needs refills on symb Dyspnea:  Long driveway to [place] x 3 most days slt uphilll to mailbox s stopping = MMRC1 = can walk nl pace, flat grade, can't hurry or go uphills or steps s sob   Cough: none  Sleeping: able to lie horizontal, one pillow SABA use: maybe twice a week at most  02: no    No obvious day to day or daytime variability or assoc excess/ purulent sputum or mucus plugs or hemoptysis or cp or chest tightness, subjective wheeze or overt sinus or hb symptoms.    Also denies any  obvious fluctuation of symptoms with weather or environmental changes or other aggravating or alleviating factors except as outlined above.   Meds reviewed/ med reconciliation completed         Observations/Objective: Sounds great   Assessment and Plan: See problem list for active a/p's   Follow Up Instructions: See avs for instructions unique to this ov which includes revised/ updated med list     I discussed the assessment and treatment plan with the patient. The patient was provided an opportunity to ask questions and all were answered. The patient agreed with the plan and demonstrated an understanding of the instructions.   The patient was advised to call back or seek an in-person evaluation if the symptoms worsen or if the condition fails to improve as anticipated.  I provided 24 minutes of non-face-to-face time during this encounter.   Christinia Gully, MD

## 2019-07-16 NOTE — Patient Instructions (Addendum)
Plan A = Automatic = Always=    Symbicort 80 Take 2 puffs first thing in am and then another 2 puffs about 12 hours later.    Plan B = Backup (to supplement plan A, not to replace it) Only use your albuterol inhaler as a rescue medication to be used if you can't catch your breath by resting or doing a relaxed purse lip breathing pattern.  - The less you use it, the better it will work when you need it. - Ok to use the inhaler up to 2 puffs  every 4 hours if you must but call for appointment if use goes up over your usual need - Don't leave home without it !!  (think of it like the spare tire for your car)    Please schedule a follow up visit in 6  months but call sooner if needed    Needs copy of AVS mailed

## 2019-07-19 DIAGNOSIS — I2510 Atherosclerotic heart disease of native coronary artery without angina pectoris: Secondary | ICD-10-CM | POA: Diagnosis not present

## 2019-07-19 DIAGNOSIS — T45515D Adverse effect of anticoagulants, subsequent encounter: Secondary | ICD-10-CM | POA: Diagnosis not present

## 2019-07-19 DIAGNOSIS — Z9181 History of falling: Secondary | ICD-10-CM | POA: Diagnosis not present

## 2019-07-19 DIAGNOSIS — W19XXXD Unspecified fall, subsequent encounter: Secondary | ICD-10-CM | POA: Diagnosis not present

## 2019-07-19 DIAGNOSIS — S81812D Laceration without foreign body, left lower leg, subsequent encounter: Secondary | ICD-10-CM | POA: Diagnosis not present

## 2019-07-19 DIAGNOSIS — I5032 Chronic diastolic (congestive) heart failure: Secondary | ICD-10-CM | POA: Diagnosis not present

## 2019-07-19 DIAGNOSIS — Z7982 Long term (current) use of aspirin: Secondary | ICD-10-CM | POA: Diagnosis not present

## 2019-07-19 DIAGNOSIS — J449 Chronic obstructive pulmonary disease, unspecified: Secondary | ICD-10-CM | POA: Diagnosis not present

## 2019-07-19 DIAGNOSIS — D6832 Hemorrhagic disorder due to extrinsic circulating anticoagulants: Secondary | ICD-10-CM | POA: Diagnosis not present

## 2019-07-19 DIAGNOSIS — I350 Nonrheumatic aortic (valve) stenosis: Secondary | ICD-10-CM | POA: Diagnosis not present

## 2019-07-19 DIAGNOSIS — S8012XD Contusion of left lower leg, subsequent encounter: Secondary | ICD-10-CM | POA: Diagnosis not present

## 2019-07-19 DIAGNOSIS — Z5181 Encounter for therapeutic drug level monitoring: Secondary | ICD-10-CM | POA: Diagnosis not present

## 2019-07-19 DIAGNOSIS — Z950 Presence of cardiac pacemaker: Secondary | ICD-10-CM | POA: Diagnosis not present

## 2019-07-19 DIAGNOSIS — N189 Chronic kidney disease, unspecified: Secondary | ICD-10-CM | POA: Diagnosis not present

## 2019-07-19 DIAGNOSIS — Z951 Presence of aortocoronary bypass graft: Secondary | ICD-10-CM | POA: Diagnosis not present

## 2019-07-19 DIAGNOSIS — Z7901 Long term (current) use of anticoagulants: Secondary | ICD-10-CM | POA: Diagnosis not present

## 2019-07-19 DIAGNOSIS — I130 Hypertensive heart and chronic kidney disease with heart failure and stage 1 through stage 4 chronic kidney disease, or unspecified chronic kidney disease: Secondary | ICD-10-CM | POA: Diagnosis not present

## 2019-07-19 DIAGNOSIS — I482 Chronic atrial fibrillation, unspecified: Secondary | ICD-10-CM | POA: Diagnosis not present

## 2019-07-19 DIAGNOSIS — Z952 Presence of prosthetic heart valve: Secondary | ICD-10-CM | POA: Diagnosis not present

## 2019-07-19 DIAGNOSIS — Z7951 Long term (current) use of inhaled steroids: Secondary | ICD-10-CM | POA: Diagnosis not present

## 2019-07-23 DIAGNOSIS — I482 Chronic atrial fibrillation, unspecified: Secondary | ICD-10-CM | POA: Diagnosis not present

## 2019-07-23 DIAGNOSIS — Z951 Presence of aortocoronary bypass graft: Secondary | ICD-10-CM | POA: Diagnosis not present

## 2019-07-23 DIAGNOSIS — I350 Nonrheumatic aortic (valve) stenosis: Secondary | ICD-10-CM | POA: Diagnosis not present

## 2019-07-23 DIAGNOSIS — I5032 Chronic diastolic (congestive) heart failure: Secondary | ICD-10-CM | POA: Diagnosis not present

## 2019-07-23 DIAGNOSIS — I130 Hypertensive heart and chronic kidney disease with heart failure and stage 1 through stage 4 chronic kidney disease, or unspecified chronic kidney disease: Secondary | ICD-10-CM | POA: Diagnosis not present

## 2019-07-23 DIAGNOSIS — S81812D Laceration without foreign body, left lower leg, subsequent encounter: Secondary | ICD-10-CM | POA: Diagnosis not present

## 2019-07-23 DIAGNOSIS — Z950 Presence of cardiac pacemaker: Secondary | ICD-10-CM | POA: Diagnosis not present

## 2019-07-23 DIAGNOSIS — N189 Chronic kidney disease, unspecified: Secondary | ICD-10-CM | POA: Diagnosis not present

## 2019-07-23 DIAGNOSIS — Z7901 Long term (current) use of anticoagulants: Secondary | ICD-10-CM | POA: Diagnosis not present

## 2019-07-23 DIAGNOSIS — S8012XD Contusion of left lower leg, subsequent encounter: Secondary | ICD-10-CM | POA: Diagnosis not present

## 2019-07-23 DIAGNOSIS — Z5181 Encounter for therapeutic drug level monitoring: Secondary | ICD-10-CM | POA: Diagnosis not present

## 2019-07-23 DIAGNOSIS — I2510 Atherosclerotic heart disease of native coronary artery without angina pectoris: Secondary | ICD-10-CM | POA: Diagnosis not present

## 2019-07-23 DIAGNOSIS — W19XXXD Unspecified fall, subsequent encounter: Secondary | ICD-10-CM | POA: Diagnosis not present

## 2019-07-23 DIAGNOSIS — Z9181 History of falling: Secondary | ICD-10-CM | POA: Diagnosis not present

## 2019-07-23 DIAGNOSIS — Z952 Presence of prosthetic heart valve: Secondary | ICD-10-CM | POA: Diagnosis not present

## 2019-07-23 DIAGNOSIS — T45515D Adverse effect of anticoagulants, subsequent encounter: Secondary | ICD-10-CM | POA: Diagnosis not present

## 2019-07-23 DIAGNOSIS — D6832 Hemorrhagic disorder due to extrinsic circulating anticoagulants: Secondary | ICD-10-CM | POA: Diagnosis not present

## 2019-07-23 DIAGNOSIS — Z7951 Long term (current) use of inhaled steroids: Secondary | ICD-10-CM | POA: Diagnosis not present

## 2019-07-23 DIAGNOSIS — J449 Chronic obstructive pulmonary disease, unspecified: Secondary | ICD-10-CM | POA: Diagnosis not present

## 2019-07-23 DIAGNOSIS — Z7982 Long term (current) use of aspirin: Secondary | ICD-10-CM | POA: Diagnosis not present

## 2019-07-24 ENCOUNTER — Telehealth: Payer: Self-pay

## 2019-07-24 NOTE — Telephone Encounter (Signed)
Contacted pt, checked chart looks like she had a prevnar 13 09/17/2015. Informed pt of this date. She wrote it down

## 2019-07-24 NOTE — Telephone Encounter (Signed)
Copied from [place] [id number]. Topic: General - Inquiry >> Jul 24, 2019 11:39 AM Mathis Bud wrote: Reason for CRM: Patient would like to know if she has a pneumonia shot in the last 5 years.  Requesting a call back  Call back [phone]

## 2019-07-25 DIAGNOSIS — S50811D Abrasion of right forearm, subsequent encounter: Secondary | ICD-10-CM | POA: Diagnosis not present

## 2019-07-25 DIAGNOSIS — S80812D Abrasion, left lower leg, subsequent encounter: Secondary | ICD-10-CM | POA: Diagnosis not present

## 2019-07-26 DIAGNOSIS — Z7951 Long term (current) use of inhaled steroids: Secondary | ICD-10-CM | POA: Diagnosis not present

## 2019-07-26 DIAGNOSIS — S8012XD Contusion of left lower leg, subsequent encounter: Secondary | ICD-10-CM | POA: Diagnosis not present

## 2019-07-26 DIAGNOSIS — I350 Nonrheumatic aortic (valve) stenosis: Secondary | ICD-10-CM | POA: Diagnosis not present

## 2019-07-26 DIAGNOSIS — S81812D Laceration without foreign body, left lower leg, subsequent encounter: Secondary | ICD-10-CM | POA: Diagnosis not present

## 2019-07-26 DIAGNOSIS — J449 Chronic obstructive pulmonary disease, unspecified: Secondary | ICD-10-CM | POA: Diagnosis not present

## 2019-07-26 DIAGNOSIS — W19XXXD Unspecified fall, subsequent encounter: Secondary | ICD-10-CM | POA: Diagnosis not present

## 2019-07-26 DIAGNOSIS — N189 Chronic kidney disease, unspecified: Secondary | ICD-10-CM | POA: Diagnosis not present

## 2019-07-26 DIAGNOSIS — Z5181 Encounter for therapeutic drug level monitoring: Secondary | ICD-10-CM | POA: Diagnosis not present

## 2019-07-26 DIAGNOSIS — Z7901 Long term (current) use of anticoagulants: Secondary | ICD-10-CM | POA: Diagnosis not present

## 2019-07-26 DIAGNOSIS — Z950 Presence of cardiac pacemaker: Secondary | ICD-10-CM | POA: Diagnosis not present

## 2019-07-26 DIAGNOSIS — I2510 Atherosclerotic heart disease of native coronary artery without angina pectoris: Secondary | ICD-10-CM | POA: Diagnosis not present

## 2019-07-26 DIAGNOSIS — I5032 Chronic diastolic (congestive) heart failure: Secondary | ICD-10-CM | POA: Diagnosis not present

## 2019-07-26 DIAGNOSIS — I482 Chronic atrial fibrillation, unspecified: Secondary | ICD-10-CM | POA: Diagnosis not present

## 2019-07-26 DIAGNOSIS — Z952 Presence of prosthetic heart valve: Secondary | ICD-10-CM | POA: Diagnosis not present

## 2019-07-26 DIAGNOSIS — Z951 Presence of aortocoronary bypass graft: Secondary | ICD-10-CM | POA: Diagnosis not present

## 2019-07-26 DIAGNOSIS — Z9181 History of falling: Secondary | ICD-10-CM | POA: Diagnosis not present

## 2019-07-26 DIAGNOSIS — I130 Hypertensive heart and chronic kidney disease with heart failure and stage 1 through stage 4 chronic kidney disease, or unspecified chronic kidney disease: Secondary | ICD-10-CM | POA: Diagnosis not present

## 2019-07-26 DIAGNOSIS — D6832 Hemorrhagic disorder due to extrinsic circulating anticoagulants: Secondary | ICD-10-CM | POA: Diagnosis not present

## 2019-07-26 DIAGNOSIS — Z7982 Long term (current) use of aspirin: Secondary | ICD-10-CM | POA: Diagnosis not present

## 2019-07-26 DIAGNOSIS — T45515D Adverse effect of anticoagulants, subsequent encounter: Secondary | ICD-10-CM | POA: Diagnosis not present

## 2019-07-30 ENCOUNTER — Other Ambulatory Visit: Payer: Self-pay

## 2019-07-30 ENCOUNTER — Other Ambulatory Visit: Payer: Self-pay | Admitting: Cardiovascular Disease

## 2019-07-30 ENCOUNTER — Other Ambulatory Visit: Payer: Self-pay | Admitting: General Practice

## 2019-07-30 ENCOUNTER — Ambulatory Visit (INDEPENDENT_AMBULATORY_CARE_PROVIDER_SITE_OTHER): Payer: Medicare Other | Admitting: General Practice

## 2019-07-30 DIAGNOSIS — I4821 Permanent atrial fibrillation: Secondary | ICD-10-CM

## 2019-07-30 DIAGNOSIS — I4891 Unspecified atrial fibrillation: Secondary | ICD-10-CM

## 2019-07-30 LAB — POCT INR: INR: 2.4 (ref 2.0–3.0)

## 2019-07-30 MED ORDER — WARFARIN SODIUM 2 MG PO TABS: ORAL_TABLET | ORAL | 1 refills | Status: DC

## 2019-07-30 NOTE — Patient Instructions (Addendum)
Pre visit review using our clinic review tool, if applicable. No additional management support is needed unless otherwise documented below in the visit note.  Continue to take 1 tablet daily except 2 tablets only on Mondays.  Re-check in 6 weeks.

## 2019-07-31 DIAGNOSIS — Z7982 Long term (current) use of aspirin: Secondary | ICD-10-CM | POA: Diagnosis not present

## 2019-07-31 DIAGNOSIS — Z952 Presence of prosthetic heart valve: Secondary | ICD-10-CM | POA: Diagnosis not present

## 2019-07-31 DIAGNOSIS — I482 Chronic atrial fibrillation, unspecified: Secondary | ICD-10-CM | POA: Diagnosis not present

## 2019-07-31 DIAGNOSIS — Z9181 History of falling: Secondary | ICD-10-CM | POA: Diagnosis not present

## 2019-07-31 DIAGNOSIS — Z7951 Long term (current) use of inhaled steroids: Secondary | ICD-10-CM | POA: Diagnosis not present

## 2019-07-31 DIAGNOSIS — I350 Nonrheumatic aortic (valve) stenosis: Secondary | ICD-10-CM | POA: Diagnosis not present

## 2019-07-31 DIAGNOSIS — N189 Chronic kidney disease, unspecified: Secondary | ICD-10-CM | POA: Diagnosis not present

## 2019-07-31 DIAGNOSIS — Z7901 Long term (current) use of anticoagulants: Secondary | ICD-10-CM | POA: Diagnosis not present

## 2019-07-31 DIAGNOSIS — J449 Chronic obstructive pulmonary disease, unspecified: Secondary | ICD-10-CM | POA: Diagnosis not present

## 2019-07-31 DIAGNOSIS — D6832 Hemorrhagic disorder due to extrinsic circulating anticoagulants: Secondary | ICD-10-CM | POA: Diagnosis not present

## 2019-07-31 DIAGNOSIS — Z950 Presence of cardiac pacemaker: Secondary | ICD-10-CM | POA: Diagnosis not present

## 2019-07-31 DIAGNOSIS — I2510 Atherosclerotic heart disease of native coronary artery without angina pectoris: Secondary | ICD-10-CM | POA: Diagnosis not present

## 2019-07-31 DIAGNOSIS — Z5181 Encounter for therapeutic drug level monitoring: Secondary | ICD-10-CM | POA: Diagnosis not present

## 2019-07-31 DIAGNOSIS — S81812D Laceration without foreign body, left lower leg, subsequent encounter: Secondary | ICD-10-CM | POA: Diagnosis not present

## 2019-07-31 DIAGNOSIS — T45515D Adverse effect of anticoagulants, subsequent encounter: Secondary | ICD-10-CM | POA: Diagnosis not present

## 2019-07-31 DIAGNOSIS — S8012XD Contusion of left lower leg, subsequent encounter: Secondary | ICD-10-CM | POA: Diagnosis not present

## 2019-07-31 DIAGNOSIS — I5032 Chronic diastolic (congestive) heart failure: Secondary | ICD-10-CM | POA: Diagnosis not present

## 2019-07-31 DIAGNOSIS — W19XXXD Unspecified fall, subsequent encounter: Secondary | ICD-10-CM | POA: Diagnosis not present

## 2019-07-31 DIAGNOSIS — I130 Hypertensive heart and chronic kidney disease with heart failure and stage 1 through stage 4 chronic kidney disease, or unspecified chronic kidney disease: Secondary | ICD-10-CM | POA: Diagnosis not present

## 2019-07-31 DIAGNOSIS — Z951 Presence of aortocoronary bypass graft: Secondary | ICD-10-CM | POA: Diagnosis not present

## 2019-08-02 DIAGNOSIS — J449 Chronic obstructive pulmonary disease, unspecified: Secondary | ICD-10-CM | POA: Diagnosis not present

## 2019-08-02 DIAGNOSIS — W19XXXD Unspecified fall, subsequent encounter: Secondary | ICD-10-CM | POA: Diagnosis not present

## 2019-08-02 DIAGNOSIS — T45515D Adverse effect of anticoagulants, subsequent encounter: Secondary | ICD-10-CM | POA: Diagnosis not present

## 2019-08-02 DIAGNOSIS — Z7951 Long term (current) use of inhaled steroids: Secondary | ICD-10-CM | POA: Diagnosis not present

## 2019-08-02 DIAGNOSIS — S8012XD Contusion of left lower leg, subsequent encounter: Secondary | ICD-10-CM | POA: Diagnosis not present

## 2019-08-02 DIAGNOSIS — D6832 Hemorrhagic disorder due to extrinsic circulating anticoagulants: Secondary | ICD-10-CM | POA: Diagnosis not present

## 2019-08-02 DIAGNOSIS — I130 Hypertensive heart and chronic kidney disease with heart failure and stage 1 through stage 4 chronic kidney disease, or unspecified chronic kidney disease: Secondary | ICD-10-CM | POA: Diagnosis not present

## 2019-08-02 DIAGNOSIS — Z7901 Long term (current) use of anticoagulants: Secondary | ICD-10-CM | POA: Diagnosis not present

## 2019-08-02 DIAGNOSIS — Z7982 Long term (current) use of aspirin: Secondary | ICD-10-CM | POA: Diagnosis not present

## 2019-08-02 DIAGNOSIS — Z950 Presence of cardiac pacemaker: Secondary | ICD-10-CM | POA: Diagnosis not present

## 2019-08-02 DIAGNOSIS — I350 Nonrheumatic aortic (valve) stenosis: Secondary | ICD-10-CM | POA: Diagnosis not present

## 2019-08-02 DIAGNOSIS — S81812D Laceration without foreign body, left lower leg, subsequent encounter: Secondary | ICD-10-CM | POA: Diagnosis not present

## 2019-08-02 DIAGNOSIS — Z951 Presence of aortocoronary bypass graft: Secondary | ICD-10-CM | POA: Diagnosis not present

## 2019-08-02 DIAGNOSIS — I5032 Chronic diastolic (congestive) heart failure: Secondary | ICD-10-CM | POA: Diagnosis not present

## 2019-08-02 DIAGNOSIS — I482 Chronic atrial fibrillation, unspecified: Secondary | ICD-10-CM | POA: Diagnosis not present

## 2019-08-02 DIAGNOSIS — N189 Chronic kidney disease, unspecified: Secondary | ICD-10-CM | POA: Diagnosis not present

## 2019-08-02 DIAGNOSIS — Z9181 History of falling: Secondary | ICD-10-CM | POA: Diagnosis not present

## 2019-08-02 DIAGNOSIS — Z5181 Encounter for therapeutic drug level monitoring: Secondary | ICD-10-CM | POA: Diagnosis not present

## 2019-08-02 DIAGNOSIS — Z952 Presence of prosthetic heart valve: Secondary | ICD-10-CM | POA: Diagnosis not present

## 2019-08-02 DIAGNOSIS — I2510 Atherosclerotic heart disease of native coronary artery without angina pectoris: Secondary | ICD-10-CM | POA: Diagnosis not present

## 2019-08-06 ENCOUNTER — Ambulatory Visit (INDEPENDENT_AMBULATORY_CARE_PROVIDER_SITE_OTHER): Payer: Medicare Other | Admitting: Pharmacist

## 2019-08-06 ENCOUNTER — Other Ambulatory Visit: Payer: Self-pay

## 2019-08-06 VITALS — BP 144/62 | HR 81 | Resp 16 | Ht 63.0 in | Wt 152.2 lb

## 2019-08-06 DIAGNOSIS — E785 Hyperlipidemia, unspecified: Secondary | ICD-10-CM | POA: Diagnosis not present

## 2019-08-06 MED ORDER — ROSUVASTATIN CALCIUM 5 MG PO TABS: ORAL_TABLET | ORAL | 1 refills | Status: DC

## 2019-08-06 NOTE — Patient Instructions (Signed)
Lipid Clinic (pharmacist) Annalea Alguire/Kristin  *START taking rosuvastatin 2.5mg  (1/2 tablet) every Monday and Friday for 2 weeks, then increase to 5mg  (1 tablet) every Monday and Friday if able to tolerate.  *Further cholesterol manegement per Primary Care Physician*  *REPEAT fasting blood work in 3 months after starting rosuvastatin 5mg *    High Cholesterol  High cholesterol is a condition in which the blood has high levels of a white, waxy, fat-like substance (cholesterol). The human body needs small amounts of cholesterol. The liver makes all the cholesterol that the body needs. Extra (excess) cholesterol comes from the food that we eat. Cholesterol is carried from the liver by the blood through the blood vessels. If you have high cholesterol, deposits (plaques) may build up on the walls of your blood vessels (arteries). Plaques make the arteries narrower and stiffer. Cholesterol plaques increase your risk for heart attack and stroke. Work with your health care provider to keep your cholesterol levels in a healthy range. What increases the risk? This condition is more likely to develop in people who:  Eat foods that are high in animal fat (saturated fat) or cholesterol.  Are overweight.  Are not getting enough exercise.  Have a family history of high cholesterol. What are the signs or symptoms? There are no symptoms of this condition. How is this diagnosed? This condition may be diagnosed from the results of a blood test.  If you are older than age 53, your health care provider may check your cholesterol every 4-6 years.  You may be checked more often if you already have high cholesterol or other risk factors for heart disease. The blood test for cholesterol measures:  "Bad" cholesterol (LDL cholesterol). This is the main type of cholesterol that causes heart disease. The desired level for LDL is less than 100.  "Good" cholesterol (HDL cholesterol). This type helps to protect  against heart disease by cleaning the arteries and carrying the LDL away. The desired level for HDL is 60 or higher.  Triglycerides. These are fats that the body can store or burn for energy. The desired number for triglycerides is lower than 150.  Total cholesterol. This is a measure of the total amount of cholesterol in your blood, including LDL cholesterol, HDL cholesterol, and triglycerides. A healthy number is less than 200. How is this treated? This condition is treated with diet changes, lifestyle changes, and medicines. Diet changes  This may include eating more whole grains, fruits, vegetables, nuts, and fish.  This may also include cutting back on red meat and foods that have a lot of added sugar. Lifestyle changes  Changes may include getting at least 40 minutes of aerobic exercise 3 times a week. Aerobic exercises include walking, biking, and swimming. Aerobic exercise along with a healthy diet can help you maintain a healthy weight.  Changes may also include quitting smoking. Medicines  Medicines are usually given if diet and lifestyle changes have failed to reduce your cholesterol to healthy levels.  Your health care provider may prescribe a statin medicine. Statin medicines have been shown to reduce cholesterol, which can reduce the risk of heart disease. Follow these instructions at home: Eating and drinking If told by your health care provider:  Eat chicken (without skin), fish, veal, shellfish, ground [place] breast, and round or loin cuts of red meat.  Do not eat fried foods or fatty meats, such as hot dogs and salami.  Eat plenty of fruits, such as apples.  Eat plenty of vegetables, such  as broccoli, potatoes, and carrots.  Eat beans, peas, and lentils.  Eat grains such as barley, rice, couscous, and bulgur wheat.  Eat pasta without cream sauces.  Use skim or nonfat milk, and eat low-fat or nonfat yogurt and cheeses.  Do not eat or drink whole milk, cream,  ice cream, egg yolks, or hard cheeses.  Do not eat stick margarine or tub margarines that contain trans fats (also called partially hydrogenated oils).  Do not eat saturated tropical oils, such as coconut oil and palm oil.  Do not eat cakes, cookies, crackers, or other baked goods that contain trans fats.  General instructions  Exercise as directed by your health care provider. Increase your activity level with activities such as gardening, walking, and taking the stairs.  Take over-the-counter and prescription medicines only as told by your health care provider.  Do not use any products that contain nicotine or tobacco, such as cigarettes and e-cigarettes. If you need help quitting, ask your health care provider.  Keep all follow-up visits as told by your health care provider. This is important. Contact a health care provider if:  You are struggling to maintain a healthy diet or weight.  You need help to start on an exercise program.  You need help to stop smoking. Get help right away if:  You have chest pain.  You have trouble breathing. This information is not intended to replace advice given to you by your health care provider. Make sure you discuss any questions you have with your health care provider. Document Released: 09/13/2005 Document Revised: 09/16/2017 Document Reviewed: 03/13/2016 Elsevier Patient Education  2020 [employer].

## 2019-08-06 NOTE — Progress Notes (Signed)
Patient ID: IWANA DANSIE                 DOB: 22-Jun-1935                    MRN: [id number]     HPI:  Catherine [name] is a 84 y.o. female patient referred to lipid clinic by . PMH is significant for Afib, HTN, malve disease, aortic stenosis, carotid stenosis, CABG, and heart failure. Noted prior history of statin intolerance with failed trial to rosuvastatin 5mg , and simvastatin 10mg  . She was also unable to tolerate ezetimibe 5mg  daily. Patient presents to lipid clinic for therapy assessment potential initiation of PCSK9i.   Current Medications: none  Intolerances:  Ezetimibe 10mg   - leg cramps simvastatin 10mg  daily  - leg pain, myalgia rosuvastatin 5mg  daily  -   LDL goal: 70mg /dL or 50% decrease form baseline  Diet: tried to avoid fatty food, but nor following any particular diet. Mainly home cooked meals.  Family History: The patient's family history includes Leukemia in her father.   Social History: patient  reports that she quit smoking about 43 years ago. Her smoking use included cigarettes. She has a 15.00 pack-year smoking history. She has never used smokeless tobacco. She reports that she does not drink alcohol or use drugs.   Labs: 06/06/2019: CHO 222, TG 103, HDL 53, LDL-c 135 (no therapy)  Past Medical History:  Diagnosis Date  . Aortic stenosis, mild 07/26/2017  . Arthritis   . Asthma    last attack 02/2015  . Atrial fibrillation, chronic ([place])   . Cellulitis of left lower extremity 08/17/2016   Ulcer associated with severe venous insufficiency  . Chronic anticoagulation   . Chronic diastolic CHF (congestive heart failure) ([place])   . Chronic kidney disease    "RIGHT MANY KIDNEY INFECTIONS AND STONES"  . COPD (chronic obstructive pulmonary disease) ([place])   . Coronary artery disease   . Dizziness   . H/O: rheumatic fever   . Heart murmur   . Hypertension   . PONV (postoperative nausea and vomiting)    ' SOMETIMES', BUT NOT ALWAYS"  . S/P Maze operation for  atrial fibrillation 10/14/2016   Complete bilateral atrial lesion set using cryothermy and bipolar radiofrequency ablation - atrial appendage was not treated due to previous surgical procedure (open mitral commissurotomy)  . S/P mitral valve replacement with bioprosthetic valve 10/14/2016   29 mm Medtronic Mosaic porcine bioprosthetic tissue valve  . UTI (urinary tract infection)   . Valvular heart disease    Has mitral stenosis with prior mitral commissurotomy in 1970    Current Outpatient Medications on File Prior to Visit  Medication Sig Dispense Refill  . acetaminophen (TYLENOL) 500 MG tablet Take 500 mg by mouth every 6 (six) hours as needed for mild pain.    Marland Kitchen albuterol (PROVENTIL HFA;VENTOLIN HFA) 108 (90 Base) MCG/ACT inhaler Inhale 2 puffs into the lungs every 6 (six) hours as needed for wheezing or shortness of breath. 1 Inhaler 0  . aspirin EC 81 MG EC tablet Take 1 tablet (81 mg total) by mouth daily.    . budesonide-formoterol (SYMBICORT) 80-4.5 MCG/ACT inhaler 2 PUFFS 2 TIMES A DAY 10.2 g 6  . cephALEXin (KEFLEX) 500 MG capsule Take 1 capsule (500 mg total) by mouth 2 (two) times daily. 14 capsule 0  . diazepam (VALIUM) 5 MG tablet TAKE ONE TABLET AT BEDTIME 30 tablet 5  . doxazosin (CARDURA) 4 MG tablet  TAKE 1 TABLET DAILY. STOP LOSARTAN 90 tablet 0  . fluconazole (DIFLUCAN) 150 MG tablet Take 1 tablet by mouth once for 1 dose. 1 tablet 0  . furosemide (LASIX) 40 MG tablet TAKE 1 TABLET DAILY. MAY TAKE 1 EXTRA AS NEEDED FOR SWELLING 60 tablet 6  . OVER THE COUNTER MEDICATION Apply 1 application topically at bedtime as needed (Leg Pain). Magni-Life topical cream for leg pain.    . potassium chloride (KLOR-CON) 10 MEQ tablet If taking 1 lasix tab- Take 2 tablets by mouth twice a day.  If 2 tabs of lasix, then take 5 tabs of potassium    . triamterene-hydrochlorothiazide (MAXZIDE-25) 37.5-25 MG tablet Take 1 tablet by mouth daily. 90 tablet 3  . warfarin (COUMADIN) 2 MG tablet Take 1  tablet daily except 2 on Mondays or AS DIRECTED BY ANTICOAGULATION CLINIC  90 DAY 105 tablet 1   No current facility-administered medications on file prior to visit.     Allergies  Allergen Reactions  . Aldactone [Spironolactone] Other (See Comments)    dyspnea  . Amoxicillin Palpitations    Tachycardia Has patient had a PCN reaction causing immediate rash, facial/tongue/throat swelling, SOB or lightheadedness with hypotension: no Has patient had a PCN reaction causing severe rash involving mucus membranes or skin necrosis: {no Has patient had a PCN reaction that required hospitalization {no Has patient had a PCN reaction occurring within the last 10 years: {yes If all of the above answers are "NO", then may proceed with Cephalosporin use.  . Diltiazem Other (See Comments)    Causing headaches   . Flagyl [Metronidazole Hcl] Other (See Comments)    Causing headaches    . Flovent [Fluticasone Propionate] Other (See Comments)    Leg cramps  . Gabapentin Swelling  . Lyrica [Pregabalin] Swelling  . Quinidine Diarrhea and Other (See Comments)    Fever diarrhea  . Simvastatin Other (See Comments)    Leg pain, myalgia  . Tramadol Nausea Only  . Verapamil Other (See Comments)    myalgias  . Amlodipine     Low extremity edema  . Zetia [Ezetimibe]     LEG CRAMPS   . Ace Inhibitors Other (See Comments)    unknown  . Benazepril Hcl Cough  . Ciprocin-Fluocin-Procin [Fluocinolone Acetonide] Other (See Comments)    unknown  . Ciprofloxacin Diarrhea  . Codeine Nausea Only  . Nitrofurantoin Monohyd Macro Nausea Only    Dyslipidemia LDL remains well above goal for secondary prevention. We spent significant amount if time discussing LDL goal and reasons to keep it low.  I mentioned use of Repatha/Praluent or Nexletol but she is reluctant to initiate any injectable medication. Patient will like to persue re-challenge with rosuvastatin. Her husband is currently taking rosuvastatin daily  and she is willing to take the medication twice weekly. Patient also experssed interest in further lipid management to be done by PCP (Dr Elease Hashimoto).   Will initiate rosuvastatin at 2.5mg  (requested by patient) every Monday and Friday for 2 weeks, then increase to 5mg  twice every Monday and Friday. Fasting lipid panel should be repeated in 3 months and dose may be increased to rosuvastatin 5mg  4x/week if patient able to tolerate.recoomend to initiate Nexletol if additional lipid lowering therapy is needed on follow up and patient is unable to tolerate rosuvastatin 5mg  or LDL remains not at goal.     Asees Manfredi Rodriguez-Guzman PharmD, BCPS, [place] [address] [place] [address] 08/12/2019 5:37 PM

## 2019-08-07 DIAGNOSIS — J449 Chronic obstructive pulmonary disease, unspecified: Secondary | ICD-10-CM | POA: Diagnosis not present

## 2019-08-07 DIAGNOSIS — Z951 Presence of aortocoronary bypass graft: Secondary | ICD-10-CM | POA: Diagnosis not present

## 2019-08-07 DIAGNOSIS — I350 Nonrheumatic aortic (valve) stenosis: Secondary | ICD-10-CM | POA: Diagnosis not present

## 2019-08-07 DIAGNOSIS — I2510 Atherosclerotic heart disease of native coronary artery without angina pectoris: Secondary | ICD-10-CM | POA: Diagnosis not present

## 2019-08-07 DIAGNOSIS — N189 Chronic kidney disease, unspecified: Secondary | ICD-10-CM | POA: Diagnosis not present

## 2019-08-07 DIAGNOSIS — I482 Chronic atrial fibrillation, unspecified: Secondary | ICD-10-CM | POA: Diagnosis not present

## 2019-08-07 DIAGNOSIS — S81812D Laceration without foreign body, left lower leg, subsequent encounter: Secondary | ICD-10-CM | POA: Diagnosis not present

## 2019-08-07 DIAGNOSIS — I130 Hypertensive heart and chronic kidney disease with heart failure and stage 1 through stage 4 chronic kidney disease, or unspecified chronic kidney disease: Secondary | ICD-10-CM | POA: Diagnosis not present

## 2019-08-07 DIAGNOSIS — Z9181 History of falling: Secondary | ICD-10-CM | POA: Diagnosis not present

## 2019-08-07 DIAGNOSIS — W19XXXD Unspecified fall, subsequent encounter: Secondary | ICD-10-CM | POA: Diagnosis not present

## 2019-08-07 DIAGNOSIS — D6832 Hemorrhagic disorder due to extrinsic circulating anticoagulants: Secondary | ICD-10-CM | POA: Diagnosis not present

## 2019-08-07 DIAGNOSIS — Z5181 Encounter for therapeutic drug level monitoring: Secondary | ICD-10-CM | POA: Diagnosis not present

## 2019-08-07 DIAGNOSIS — Z952 Presence of prosthetic heart valve: Secondary | ICD-10-CM | POA: Diagnosis not present

## 2019-08-07 DIAGNOSIS — I5032 Chronic diastolic (congestive) heart failure: Secondary | ICD-10-CM | POA: Diagnosis not present

## 2019-08-07 DIAGNOSIS — Z7951 Long term (current) use of inhaled steroids: Secondary | ICD-10-CM | POA: Diagnosis not present

## 2019-08-07 DIAGNOSIS — S8012XD Contusion of left lower leg, subsequent encounter: Secondary | ICD-10-CM | POA: Diagnosis not present

## 2019-08-07 DIAGNOSIS — Z7901 Long term (current) use of anticoagulants: Secondary | ICD-10-CM | POA: Diagnosis not present

## 2019-08-07 DIAGNOSIS — Z7982 Long term (current) use of aspirin: Secondary | ICD-10-CM | POA: Diagnosis not present

## 2019-08-07 DIAGNOSIS — Z950 Presence of cardiac pacemaker: Secondary | ICD-10-CM | POA: Diagnosis not present

## 2019-08-07 DIAGNOSIS — T45515D Adverse effect of anticoagulants, subsequent encounter: Secondary | ICD-10-CM | POA: Diagnosis not present

## 2019-08-12 ENCOUNTER — Encounter: Payer: Self-pay | Admitting: Pharmacist

## 2019-08-12 NOTE — Assessment & Plan Note (Signed)
LDL remains well above goal for secondary prevention. We spent significant amount if time discussing LDL goal and reasons to keep it low.  I mentioned use of Repatha/Praluent or Nexletol but she is reluctant to initiate any injectable medication. Patient will like to persue re-challenge with rosuvastatin. Her husband is currently taking rosuvastatin daily and she is willing to take the medication twice weekly. Patient also experssed interest in further lipid management to be done by PCP (Dr Elease Hashimoto).   Will initiate rosuvastatin at 2.5mg  (requested by patient) every Monday and Friday for 2 weeks, then increase to 5mg  twice every Monday and Friday. Fasting lipid panel should be repeated in 3 months and dose may be increased to rosuvastatin 5mg  4x/week if patient able to tolerate.recoomend to initiate Nexletol if additional lipid lowering therapy is needed on follow up and patient is unable to tolerate rosuvastatin 5mg  or LDL remains not at goal.

## 2019-08-15 ENCOUNTER — Other Ambulatory Visit: Payer: Self-pay

## 2019-08-15 ENCOUNTER — Telehealth (INDEPENDENT_AMBULATORY_CARE_PROVIDER_SITE_OTHER): Payer: Medicare Other | Admitting: Family Medicine

## 2019-08-15 DIAGNOSIS — R52 Pain, unspecified: Secondary | ICD-10-CM | POA: Diagnosis not present

## 2019-08-15 DIAGNOSIS — R059 Cough, unspecified: Secondary | ICD-10-CM

## 2019-08-15 DIAGNOSIS — R05 Cough: Secondary | ICD-10-CM

## 2019-08-15 MED ORDER — BENZONATATE 100 MG PO CAPS: 100.0000 mg | ORAL_CAPSULE | Freq: Three times a day (TID) | ORAL | 0 refills | Status: DC | PRN

## 2019-08-15 NOTE — Progress Notes (Signed)
This visit type was conducted due to national recommendations for restrictions regarding the COVID-19 pandemic in an effort to limit this patient's exposure and mitigate transmission in our community.   Virtual Visit via Telephone Note  I connected with Catherine [name] on 08/15/19 at 10:30 AM EST by telephone and verified that I am speaking with the correct person using two identifiers.   I discussed the limitations, risks, security and privacy concerns of performing an evaluation and management service by telephone and the availability of in person appointments. I also discussed with the patient that there may be a patient responsible charge related to this service. The patient expressed understanding and agreed to proceed.  Location patient: home Location provider: work or home office Participants present for the call: patient, provider Patient did not have a visit in the prior 7 days to address this/these issue(s).   History of Present Illness: Catherine [name] called to discuss recent respiratory symptoms.  Her husband is still working part-time at a funeral home and he came home last Friday feeling achy and they actually both went to Friday for testing and her husband's test came back positive but hers came back negative.  Husband is gradually improving.  Catherine [name] developed some low-grade fever and chills by Sunday night and has developed some achiness and dry cough.  Her husband lost taste and smell but Catherine [name] has not.  Neither have had diarrhea.  She does feel some better today.  She had significant night sweat last night.  Does not describe any significant dyspnea.  Her cough is been somewhat aggravating and she is asking for something for that.  Past Medical History:  Diagnosis Date  . Aortic stenosis, mild 07/26/2017  . Arthritis   . Asthma    last attack 02/2015  . Atrial fibrillation, chronic ([place])   . Cellulitis of left lower extremity 08/17/2016   Ulcer associated with severe venous insufficiency   . Chronic anticoagulation   . Chronic diastolic CHF (congestive heart failure) ([place])   . Chronic kidney disease    "RIGHT MANY KIDNEY INFECTIONS AND STONES"  . COPD (chronic obstructive pulmonary disease) ([place])   . Coronary artery disease   . Dizziness   . H/O: rheumatic fever   . Heart murmur   . Hypertension   . PONV (postoperative nausea and vomiting)    ' SOMETIMES', BUT NOT ALWAYS"  . S/P Maze operation for atrial fibrillation 10/14/2016   Complete bilateral atrial lesion set using cryothermy and bipolar radiofrequency ablation - atrial appendage was not treated due to previous surgical procedure (open mitral commissurotomy)  . S/P mitral valve replacement with bioprosthetic valve 10/14/2016   29 mm Medtronic Mosaic porcine bioprosthetic tissue valve  . UTI (urinary tract infection)   . Valvular heart disease    Has mitral stenosis with prior mitral commissurotomy in 1970   Past Surgical History:  Procedure Laterality Date  . ABDOMINAL HYSTERECTOMY  1983   endometriosis  . APPENDECTOMY    . BACK SURGERY     neurosurgery x2  . CARDIAC CATHETERIZATION    . CARDIAC CATHETERIZATION N/A 08/03/2016   Procedure: Right/Left Heart Cath and Coronary Angiography;  Surgeon: Peter M Martinique, MD;  Location: [facility];  Service: Cardiovascular;  Laterality: N/A;  . cataract surg    . CHOLECYSTECTOMY N/A 05/30/2015   Procedure: LAPAROSCOPIC CHOLECYSTECTOMY WITH INTRAOPERATIVE CHOLANGIOGRAM;  Surgeon: Excell Seltzer, MD;  Location: [place];  Service: General;  Laterality: N/A;  . COLONOSCOPY    . CORONARY  ARTERY BYPASS GRAFT N/A 10/14/2016   Procedure: CORONARY ARTERY BYPASS GRAFTING (CABG);  Surgeon: Rexene Alberts, MD;  Location: [facility];  Service: Open Heart Surgery;  Laterality: N/A;  . EYE SURGERY    . MAZE N/A 10/14/2016   Procedure: MAZE;  Surgeon: Rexene Alberts, MD;  Location: [facility];  Service: Open Heart Surgery;  Laterality: N/A;  . MITRAL VALVE REPLACEMENT N/A 10/14/2016    Procedure: REDO MITRAL VALVE REPLACEMENT (MVR);  Surgeon: Rexene Alberts, MD;  Location: [place];  Service: Open Heart Surgery;  Laterality: N/A;  . MITRAL VALVE SURGERY Left 1970   Open mitral commissurotomy via left thoracotomy approach  . TEE WITHOUT CARDIOVERSION N/A 07/05/2016   Procedure: TRANSESOPHAGEAL ECHOCARDIOGRAM (TEE);  Surgeon: [name], MD;  Location: [place];  Service: Cardiovascular;  Laterality: N/A;  . TEE WITHOUT CARDIOVERSION N/A 10/14/2016   Procedure: TRANSESOPHAGEAL ECHOCARDIOGRAM (TEE);  Surgeon: Rexene Alberts, MD;  Location: [facility];  Service: Open Heart Surgery;  Laterality: N/A;    reports that she quit smoking about 43 years ago. Her smoking use included cigarettes. She has a 15.00 pack-year smoking history. She has never used smokeless tobacco. She reports that she does not drink alcohol or use drugs. family history includes Leukemia in her father. Allergies  Allergen Reactions  . Aldactone [Spironolactone] Other (See Comments)    dyspnea  . Amoxicillin Palpitations    Tachycardia Has patient had a PCN reaction causing immediate rash, facial/tongue/throat swelling, SOB or lightheadedness with hypotension: no Has patient had a PCN reaction causing severe rash involving mucus membranes or skin necrosis: {no Has patient had a PCN reaction that required hospitalization {no Has patient had a PCN reaction occurring within the last 10 years: {yes If all of the above answers are "NO", then may proceed with Cephalosporin use.  . Diltiazem Other (See Comments)    Causing headaches   . Flagyl [Metronidazole Hcl] Other (See Comments)    Causing headaches    . Flovent [Fluticasone Propionate] Other (See Comments)    Leg cramps  . Gabapentin Swelling  . Lyrica [Pregabalin] Swelling  . Quinidine Diarrhea and Other (See Comments)    Fever diarrhea  . Simvastatin Other (See Comments)    Leg pain, myalgia  . Tramadol Nausea Only  . Verapamil Other (See  Comments)    myalgias  . Amlodipine     Low extremity edema  . Zetia [Ezetimibe]     LEG CRAMPS   . Ace Inhibitors Other (See Comments)    unknown  . Benazepril Hcl Cough  . Ciprocin-Fluocin-Procin [Fluocinolone Acetonide] Other (See Comments)    unknown  . Ciprofloxacin Diarrhea  . Codeine Nausea Only  . Nitrofurantoin Monohyd Macro Nausea Only      Observations/Objective: Patient sounds cheerful and well on the phone. I do not appreciate any SOB. Speech and thought processing are grossly intact. Patient reported vitals:  Assessment and Plan:  Strong clinical suspicion for Covid.  Husband tested positive.  She was tested Friday and asymptomatic at that time but over the weekend developed fever, chills, body aches, and dry cough.  -Tessalon Perles 100 mg every 8 hours as needed for cough -Plenty of fluids and rest and supportive care -Follow-up for any increased dyspnea or other concerns  Follow Up Instructions:  -As above   99441 5-10 99442 11-20 99443 21-30 I did not refer this patient for an OV in the next 24 hours for this/these issue(s).  I discussed the assessment  and treatment plan with the patient. The patient was provided an opportunity to ask questions and all were answered. The patient agreed with the plan and demonstrated an understanding of the instructions.   The patient was advised to call back or seek an in-person evaluation if the symptoms worsen or if the condition fails to improve as anticipated.  I provided 16 minutes of non-face-to-face time during this encounter.   [name], MD

## 2019-08-17 ENCOUNTER — Other Ambulatory Visit: Payer: Self-pay

## 2019-08-17 ENCOUNTER — Telehealth: Payer: Self-pay

## 2019-08-17 ENCOUNTER — Ambulatory Visit (INDEPENDENT_AMBULATORY_CARE_PROVIDER_SITE_OTHER): Payer: Medicare Other | Admitting: Family Medicine

## 2019-08-17 DIAGNOSIS — R110 Nausea: Secondary | ICD-10-CM

## 2019-08-17 DIAGNOSIS — M791 Myalgia, unspecified site: Secondary | ICD-10-CM | POA: Diagnosis not present

## 2019-08-17 DIAGNOSIS — R300 Dysuria: Secondary | ICD-10-CM | POA: Diagnosis not present

## 2019-08-17 MED ORDER — CEPHALEXIN 500 MG PO CAPS: 500.0000 mg | ORAL_CAPSULE | Freq: Four times a day (QID) | ORAL | 0 refills | Status: DC

## 2019-08-17 MED ORDER — ONDANSETRON 4 MG PO TBDP: 4.0000 mg | ORAL_TABLET | Freq: Three times a day (TID) | ORAL | 0 refills | Status: DC | PRN

## 2019-08-17 MED ORDER — FLUCONAZOLE 150 MG PO TABS: ORAL_TABLET | ORAL | 0 refills | Status: DC

## 2019-08-17 NOTE — Progress Notes (Signed)
This visit type was conducted due to national recommendations for restrictions regarding the COVID-19 pandemic in an effort to limit this patient's exposure and mitigate transmission in our community.   Virtual Visit via Telephone Note  I connected with Catherine [name]  on 08/17/19 at  1:30 PM EST by telephone and verified that I am speaking with the correct person using two identifiers.   I discussed the limitations, risks, security and privacy concerns of performing an evaluation and management service by telephone and the availability of in person appointments. I also discussed with the patient that there may be a patient responsible charge related to this service. The patient expressed understanding and agreed to proceed.  Location patient: home Location provider: work or home office Participants present for the call: patient, provider Patient did not have a visit in the prior 7 days to address this/these issue(s).   History of Present Illness: Patient called with onset this morning of some frequent urination and burning with urination.  She states she had temperature of 102 last night.  She had some mild nausea but no vomiting.  Recent history is that her husband tested positive for Covid on Monday.  Her test was negative but she was actually tested last Friday and she developed symptoms of some body aches and malaise earlier this week.  She denies any dyspnea.  She does have occasional cough.  She has frequent UTI history and has been treated at local urgent care at least couple times this year earlier.  She has multiple allergies but can take Keflex.  No gross hematuria.  No significant flank pain.  She has some generalized myalgias and we suspect clinically that she does have Covid.  She is also lost her taste and smell this week.  She and her husband are staying very isolated.  She denies any significant diarrhea symptoms.  Past Medical History:  Diagnosis Date  . Aortic stenosis, mild  07/26/2017  . Arthritis   . Asthma    last attack 02/2015  . Atrial fibrillation, chronic ([place])   . Cellulitis of left lower extremity 08/17/2016   Ulcer associated with severe venous insufficiency  . Chronic anticoagulation   . Chronic diastolic CHF (congestive heart failure) ([place])   . Chronic kidney disease    "RIGHT MANY KIDNEY INFECTIONS AND STONES"  . COPD (chronic obstructive pulmonary disease) ([place])   . Coronary artery disease   . Dizziness   . H/O: rheumatic fever   . Heart murmur   . Hypertension   . PONV (postoperative nausea and vomiting)    ' SOMETIMES', BUT NOT ALWAYS"  . S/P Maze operation for atrial fibrillation 10/14/2016   Complete bilateral atrial lesion set using cryothermy and bipolar radiofrequency ablation - atrial appendage was not treated due to previous surgical procedure (open mitral commissurotomy)  . S/P mitral valve replacement with bioprosthetic valve 10/14/2016   29 mm Medtronic Mosaic porcine bioprosthetic tissue valve  . UTI (urinary tract infection)   . Valvular heart disease    Has mitral stenosis with prior mitral commissurotomy in 1970   Past Surgical History:  Procedure Laterality Date  . ABDOMINAL HYSTERECTOMY  1983   endometriosis  . APPENDECTOMY    . BACK SURGERY     neurosurgery x2  . CARDIAC CATHETERIZATION    . CARDIAC CATHETERIZATION N/A 08/03/2016   Procedure: Right/Left Heart Cath and Coronary Angiography;  Surgeon: Peter M Martinique, MD;  Location: [facility];  Service: Cardiovascular;  Laterality: N/A;  .  cataract surg    . CHOLECYSTECTOMY N/A 05/30/2015   Procedure: LAPAROSCOPIC CHOLECYSTECTOMY WITH INTRAOPERATIVE CHOLANGIOGRAM;  Surgeon: Excell Seltzer, MD;  Location: [facility];  Service: General;  Laterality: N/A;  . COLONOSCOPY    . CORONARY ARTERY BYPASS GRAFT N/A 10/14/2016   Procedure: CORONARY ARTERY BYPASS GRAFTING (CABG);  Surgeon: Rexene Alberts, MD;  Location: [facility];  Service: Open Heart Surgery;  Laterality: N/A;   . EYE SURGERY    . MAZE N/A 10/14/2016   Procedure: MAZE;  Surgeon: Rexene Alberts, MD;  Location: [facility];  Service: Open Heart Surgery;  Laterality: N/A;  . MITRAL VALVE REPLACEMENT N/A 10/14/2016   Procedure: REDO MITRAL VALVE REPLACEMENT (MVR);  Surgeon: Rexene Alberts, MD;  Location: [facility];  Service: Open Heart Surgery;  Laterality: N/A;  . MITRAL VALVE SURGERY Left 1970   Open mitral commissurotomy via left thoracotomy approach  . TEE WITHOUT CARDIOVERSION N/A 07/05/2016   Procedure: TRANSESOPHAGEAL ECHOCARDIOGRAM (TEE);  Surgeon: [name], MD;  Location: [facility];  Service: Cardiovascular;  Laterality: N/A;  . TEE WITHOUT CARDIOVERSION N/A 10/14/2016   Procedure: TRANSESOPHAGEAL ECHOCARDIOGRAM (TEE);  Surgeon: Rexene Alberts, MD;  Location: [facility];  Service: Open Heart Surgery;  Laterality: N/A;    reports that she quit smoking about 43 years ago. Her smoking use included cigarettes. She has a 15.00 pack-year smoking history. She has never used smokeless tobacco. She reports that she does not drink alcohol or use drugs. family history includes Leukemia in her father. Allergies  Allergen Reactions  . Aldactone [Spironolactone] Other (See Comments)    dyspnea  . Amoxicillin Palpitations    Tachycardia Has patient had a PCN reaction causing immediate rash, facial/tongue/throat swelling, SOB or lightheadedness with hypotension: no Has patient had a PCN reaction causing severe rash involving mucus membranes or skin necrosis: {no Has patient had a PCN reaction that required hospitalization {no Has patient had a PCN reaction occurring within the last 10 years: {yes If all of the above answers are "NO", then may proceed with Cephalosporin use.  . Diltiazem Other (See Comments)    Causing headaches   . Flagyl [Metronidazole Hcl] Other (See Comments)    Causing headaches    . Flovent [Fluticasone Propionate] Other (See Comments)    Leg cramps  . Gabapentin Swelling  . Lyrica  [Pregabalin] Swelling  . Quinidine Diarrhea and Other (See Comments)    Fever diarrhea  . Simvastatin Other (See Comments)    Leg pain, myalgia  . Tramadol Nausea Only  . Verapamil Other (See Comments)    myalgias  . Amlodipine     Low extremity edema  . Zetia [Ezetimibe]     LEG CRAMPS   . Ace Inhibitors Other (See Comments)    unknown  . Benazepril Hcl Cough  . Ciprocin-Fluocin-Procin [Fluocinolone Acetonide] Other (See Comments)    unknown  . Ciprofloxacin Diarrhea  . Codeine Nausea Only  . Nitrofurantoin Monohyd Macro Nausea Only      Observations/Objective: Patient sounds cheerful and well on the phone. I do not appreciate any SOB. Speech and thought processing are grossly intact. Patient reported vitals:  Assessment and Plan:  #1 dysuria.  Suspect recurrent cystitis.  Patient does report some fever but difficult to know if this is related to urinary versus Covid  -Keflex 500 mg 4 times daily for 7 days -Patient requesting fluconazole she frequently has yeast infections with antibiotic use  #2 generalized myalgias and loss of taste and smell.  Suspect COVID-19.  Even though her test came back negative husband's test came back positive and suspect clinically that she has Covid. -Plenty of fluids and rest -She knows to go promptly to the hospital for any increased shortness of breath or other concerns  Follow Up Instructions:  -As above   99441 5-10 99442 11-20 99443 21-30 I did not refer this patient for an OV in the next 24 hours for this/these issue(s).  I discussed the assessment and treatment plan with the patient. The patient was provided an opportunity to ask questions and all were answered. The patient agreed with the plan and demonstrated an understanding of the instructions.   The patient was advised to call back or seek an in-person evaluation if the symptoms worsen or if the condition fails to improve as anticipated.  I provided 22 minutes of  non-face-to-face time during this encounter.   [name], MD

## 2019-08-17 NOTE — Telephone Encounter (Signed)
Copied from [place] [id number]. Topic: General - Other >> Aug 16, 2019  2:43 PM Leward Quan A wrote: Reason for CRM: Patient called to request a call back from [facility] today, can be reached at Ph# [phone]

## 2019-08-20 ENCOUNTER — Ambulatory Visit: Payer: Self-pay

## 2019-08-20 NOTE — Telephone Encounter (Signed)
Incoming call from Pt.  Husband. Stating that Patient has positive of covid-19 has had a fever since  Last Friday.  Has no appetite is drinking Gatorade and tea.   Complaint of no taste or smell.   States Patient is weak.   And nauseated.  Patient iss nausea.  States medication  Not working.   Patient husband request a return call from  Dr.  Elease Hashimoto.           Reason for Disposition . [1] Intermittent fever > 100.0 F (37.8 C) AND [2] lasts > 3 weeks  Answer Assessment - Initial Assessment Questions 1. TEMPERATURE: "What is the most recent temperature?"  "How was it measured?"      102. 1400 2. ONSET: "When did the fever start?"     Last week  3. SYMPTOMS: "Do you have any other symptoms besides the fever?"  (e.g., colds, headache, sore throat, earache, cough, rash, diarrhea, vomiting, abdominal pain)     Headache nausea 4. CAUSE: If there are no symptoms, ask: "What do you think is causing the fever?"      *No Answer* 5. CONTACTS: "Does anyone else in the family have an infection?"    Yes a week ago.   6. TREATMENT: "What have you done so far to treat this fever?" (e.g., medications)            tylenol                     7. IMMUNOCOMPROMISE: "Do you have of the following: diabetes, HIV positive, splenectomy, cancer chemotherapy, chronic steroid treatment, transplant patient, etc."     8. PREGNANCY: "Is there any chance you are pregnant?" "When was your last menstrual period?"    na 9. TRAVEL: "Have you traveled out of the country in the last month?" (e.g., travel history, exposures)     *No Answer*  Protocols used: FEVER-A-AH

## 2019-08-22 ENCOUNTER — Other Ambulatory Visit: Payer: Self-pay | Admitting: Family Medicine

## 2019-08-22 DIAGNOSIS — R432 Parageusia: Secondary | ICD-10-CM | POA: Diagnosis not present

## 2019-08-22 DIAGNOSIS — R110 Nausea: Secondary | ICD-10-CM | POA: Diagnosis not present

## 2019-08-22 DIAGNOSIS — R509 Fever, unspecified: Secondary | ICD-10-CM | POA: Diagnosis not present

## 2019-08-27 ENCOUNTER — Other Ambulatory Visit: Payer: Self-pay

## 2019-08-27 ENCOUNTER — Telehealth (INDEPENDENT_AMBULATORY_CARE_PROVIDER_SITE_OTHER): Payer: Medicare Other | Admitting: Family Medicine

## 2019-08-27 DIAGNOSIS — U071 COVID-19: Secondary | ICD-10-CM | POA: Diagnosis not present

## 2019-08-27 DIAGNOSIS — R509 Fever, unspecified: Secondary | ICD-10-CM

## 2019-08-27 NOTE — Progress Notes (Signed)
This visit type was conducted due to national recommendations for restrictions regarding the COVID-19 pandemic in an effort to limit this patient's exposure and mitigate transmission in our community.   Virtual Visit via Telephone Note  I connected with Catherine [name] on 08/27/19 at  1:30 PM EST by telephone and verified that I am speaking with the correct person using two identifiers.   I discussed the limitations, risks, security and privacy concerns of performing an evaluation and management service by telephone and the availability of in person appointments. I also discussed with the patient that there may be a patient responsible charge related to this service. The patient expressed understanding and agreed to proceed.  Location patient: home Location provider: work or home office Participants present for the call: patient, provider Patient did not have a visit in the prior 7 days to address this/these issue(s).   History of Present Illness:  Catherine [name] has been feeling poorly now for several weeks.  Her husband tested positive for Covid over couple weeks ago.  Her initial test was negative.  She developed symptoms after her husband.  She continued to feel poorly and went back to the urgent care this past Wednesday day before Thanksgiving.  She states that no x-rays or tests were done.  She was placed on prednisone and Zithromax.  She has had some nausea and was given Phenergan which helped but she states that she never got the prescription filled because of cost.  She is not had any actual vomiting but is had some nausea and occasional "heaves ".  No diarrhea.  Her Covid test came back positive this Saturday.  She states she had fever up to 101 last night.  She has intermittent chills.  Occasional cough.  Pulse ox 96% at home.  No abdominal pain.  No dysuria.   Observations/Objective: Patient sounds cheerful and well on the phone. I do not appreciate any SOB. Speech and thought processing are  grossly intact. Patient reported vitals:  Assessment and Plan: Recent Covid diagnosis.  Patient has persistent malaise, nausea without vomiting, cough, and reported persistent fever.  -Continue plenty of fluids and rest -Continue to monitor pulse oximetry and be in touch for O2 sats low 90s or below 90 -We recommend that if her fever persists consider follow-up at urgent care to be reassessed for possible chest x-ray and possibly some labs and urinalysis.  Follow Up Instructions:  -As above   99441 5-10 99442 11-20 99443 21-30 I did not refer this patient for an OV in the next 24 hours for this/these issue(s).  I discussed the assessment and treatment plan with the patient. The patient was provided an opportunity to ask questions and all were answered. The patient agreed with the plan and demonstrated an understanding of the instructions.   The patient was advised to call back or seek an in-person evaluation if the symptoms worsen or if the condition fails to improve as anticipated.  I provided 20 minutes of non-face-to-face time during this encounter.   [name], MD

## 2019-08-29 ENCOUNTER — Emergency Department (HOSPITAL_BASED_OUTPATIENT_CLINIC_OR_DEPARTMENT_OTHER): Payer: Medicare Other

## 2019-08-29 ENCOUNTER — Encounter (HOSPITAL_BASED_OUTPATIENT_CLINIC_OR_DEPARTMENT_OTHER): Payer: Self-pay | Admitting: Emergency Medicine

## 2019-08-29 ENCOUNTER — Other Ambulatory Visit: Payer: Self-pay

## 2019-08-29 ENCOUNTER — Emergency Department (HOSPITAL_BASED_OUTPATIENT_CLINIC_OR_DEPARTMENT_OTHER)
Admission: EM | Admit: 2019-08-29 | Discharge: 2019-08-29 | Disposition: A | Discharge status: Home / Self Care | Payer: Medicare Other | Attending: Emergency Medicine | Admitting: Emergency Medicine

## 2019-08-29 DIAGNOSIS — I110 Hypertensive heart disease with heart failure: Secondary | ICD-10-CM | POA: Diagnosis not present

## 2019-08-29 DIAGNOSIS — Z7982 Long term (current) use of aspirin: Secondary | ICD-10-CM | POA: Diagnosis not present

## 2019-08-29 DIAGNOSIS — Z888 Allergy status to other drugs, medicaments and biological substances status: Secondary | ICD-10-CM | POA: Insufficient documentation

## 2019-08-29 DIAGNOSIS — I5032 Chronic diastolic (congestive) heart failure: Secondary | ICD-10-CM | POA: Diagnosis not present

## 2019-08-29 DIAGNOSIS — Z87891 Personal history of nicotine dependence: Secondary | ICD-10-CM | POA: Diagnosis not present

## 2019-08-29 DIAGNOSIS — Z951 Presence of aortocoronary bypass graft: Secondary | ICD-10-CM | POA: Diagnosis not present

## 2019-08-29 DIAGNOSIS — I4891 Unspecified atrial fibrillation: Secondary | ICD-10-CM | POA: Diagnosis not present

## 2019-08-29 DIAGNOSIS — Z952 Presence of prosthetic heart valve: Secondary | ICD-10-CM | POA: Insufficient documentation

## 2019-08-29 DIAGNOSIS — R05 Cough: Secondary | ICD-10-CM | POA: Diagnosis present

## 2019-08-29 DIAGNOSIS — Z79899 Other long term (current) drug therapy: Secondary | ICD-10-CM | POA: Diagnosis not present

## 2019-08-29 DIAGNOSIS — Z880 Allergy status to penicillin: Secondary | ICD-10-CM | POA: Insufficient documentation

## 2019-08-29 DIAGNOSIS — U071 COVID-19: Secondary | ICD-10-CM | POA: Insufficient documentation

## 2019-08-29 DIAGNOSIS — J449 Chronic obstructive pulmonary disease, unspecified: Secondary | ICD-10-CM | POA: Diagnosis not present

## 2019-08-29 DIAGNOSIS — Z7901 Long term (current) use of anticoagulants: Secondary | ICD-10-CM | POA: Diagnosis not present

## 2019-08-29 DIAGNOSIS — R0602 Shortness of breath: Secondary | ICD-10-CM | POA: Diagnosis not present

## 2019-08-29 NOTE — ED Triage Notes (Addendum)
Reports positive covid test 7 days ago.  States that today she has had worsening shortness of breath.  Referred by PCP to ER for blood work and cxr.  Ambulatory to room in NAD, oxygen saturations remain between 95%-100%, dyspnea on exertion noted while undressing.

## 2019-08-29 NOTE — ED Provider Notes (Signed)
[facility] EMERGENCY DEPARTMENT Provider Note   CSN: [id number] Arrival date & time: 08/29/19  1110     History   Chief Complaint Chief Complaint  Patient presents with   covid positive    HPI SHALAUNDA SUBASIC is a 84 y.o. female.     The history is provided by the patient and the spouse.  URI Presenting symptoms: cough and fever   Presenting symptoms: no ear pain and no sore throat   Severity:  Mild Onset quality:  Gradual Duration:  1 week Timing:  Intermittent Progression:  Waxing and waning Chronicity:  New Relieved by: tylenol, finished antibiotics. Worsened by:  Nothing Associated symptoms: no arthralgias   Risk factors: being elderly, chronic cardiac disease and chronic respiratory disease   Risk factors: no sick contacts     Past Medical History:  Diagnosis Date   Aortic stenosis, mild 07/26/2017   Arthritis    Asthma    last attack 02/2015   Atrial fibrillation, chronic (HCC)    Cellulitis of left lower extremity 08/17/2016   Ulcer associated with severe venous insufficiency   Chronic anticoagulation    Chronic diastolic CHF (congestive heart failure) ([place])    Chronic kidney disease    "RIGHT MANY KIDNEY INFECTIONS AND STONES"   COPD (chronic obstructive pulmonary disease) (HCC)    Coronary artery disease    Dizziness    H/O: rheumatic fever    Heart murmur    Hypertension    PONV (postoperative nausea and vomiting)    ' SOMETIMES', BUT NOT ALWAYS"   S/P Maze operation for atrial fibrillation 10/14/2016   Complete bilateral atrial lesion set using cryothermy and bipolar radiofrequency ablation - atrial appendage was not treated due to previous surgical procedure (open mitral commissurotomy)   S/P mitral valve replacement with bioprosthetic valve 10/14/2016   29 mm [employer]onic Mosaic porcine bioprosthetic tissue valve   UTI (urinary tract infection)    Valvular heart disease    Has mitral stenosis with prior mitral  commissurotomy in 1970    Patient Active Problem List   Diagnosis Date Noted   COVID-19 08/27/2019   Dysuria 07/07/2019   Chronic anxiety 12/23/2017   Aortic stenosis, mild 07/26/2017   Dyslipidemia 12/20/2016   S/P mitral valve replacement with bioprosthetic valve + CABG x1 + maze procedure 10/14/2016   S/P Maze operation for atrial fibrillation 10/14/2016   S/P CABG x 1 10/14/2016   Cellulitis of left lower extremity 08/17/2016   Chronic diastolic CHF (congestive heart failure) ([place])    Coronary artery disease    Chronic insomnia 07/12/2016   Dyspnea 03/26/2015   Atrial fibrillation, chronic ([place]) 03/16/2015   Warfarin-induced coagulopathy ([place]) 03/16/2015   Valvular heart disease 03/16/2015   COPD GOLD II  03/16/2015   Nephrolithiasis 03/16/2015   Essential hypertension    RHEUMATIC MITRAL STENOSIS 01/05/2008   Rheumatic heart disease 01/05/2008   Severe mitral insufficiency 01/05/2008   Atrial fibrillation ([place]) 01/05/2008    Past Surgical History:  Procedure Laterality Date   ABDOMINAL HYSTERECTOMY  1983   endometriosis   APPENDECTOMY     BACK SURGERY     neurosurgery x2   CARDIAC CATHETERIZATION     CARDIAC CATHETERIZATION N/A 08/03/2016   Procedure: Right/Left Heart Cath and Coronary Angiography;  Surgeon: Peter M Martinique, MD;  Location: [facility];  Service: Cardiovascular;  Laterality: N/A;   cataract surg     CHOLECYSTECTOMY N/A 05/30/2015   Procedure: LAPAROSCOPIC CHOLECYSTECTOMY WITH INTRAOPERATIVE CHOLANGIOGRAM;  Surgeon: Excell Seltzer, MD;  Location: [facility];  Service: General;  Laterality: N/A;   COLONOSCOPY     CORONARY ARTERY BYPASS GRAFT N/A 10/14/2016   Procedure: CORONARY ARTERY BYPASS GRAFTING (CABG);  Surgeon: Rexene Alberts, MD;  Location: [facility];  Service: Open Heart Surgery;  Laterality: N/A;   EYE SURGERY     MAZE N/A 10/14/2016   Procedure: MAZE;  Surgeon: Rexene Alberts, MD;  Location: [facility];  Service:  Open Heart Surgery;  Laterality: N/A;   MITRAL VALVE REPLACEMENT N/A 10/14/2016   Procedure: REDO MITRAL VALVE REPLACEMENT (MVR);  Surgeon: Rexene Alberts, MD;  Location: [facility];  Service: Open Heart Surgery;  Laterality: N/A;   MITRAL VALVE SURGERY Left 1970   Open mitral commissurotomy via left thoracotomy approach   TEE WITHOUT CARDIOVERSION N/A 07/05/2016   Procedure: TRANSESOPHAGEAL ECHOCARDIOGRAM (TEE);  Surgeon: [name], MD;  Location: [facility];  Service: Cardiovascular;  Laterality: N/A;   TEE WITHOUT CARDIOVERSION N/A 10/14/2016   Procedure: TRANSESOPHAGEAL ECHOCARDIOGRAM (TEE);  Surgeon: Rexene Alberts, MD;  Location: [facility];  Service: Open Heart Surgery;  Laterality: N/A;     OB History   No obstetric history on file.      Home Medications    Prior to Admission medications   Medication Sig Start Date End Date Taking? Authorizing Provider  acetaminophen (TYLENOL) 500 MG tablet Take 500 mg by mouth every 6 (six) hours as needed for mild pain.    [provider]  albuterol (PROVENTIL HFA;VENTOLIN HFA) 108 (90 Base) MCG/ACT inhaler Inhale 2 puffs into the lungs every 6 (six) hours as needed for wheezing or shortness of breath. 11/06/18   Burchette, Alinda Sierras, MD  aspirin EC 81 MG EC tablet Take 1 tablet (81 mg total) by mouth daily. 10/22/16   Gold, Wayne E, PA-C  benzonatate (TESSALON) 100 MG capsule Take 1 capsule (100 mg total) by mouth 3 (three) times daily as needed for cough. 08/15/19   Burchette, Alinda Sierras, MD  budesonide-formoterol (SYMBICORT) 80-4.5 MCG/ACT inhaler 2 PUFFS 2 TIMES A DAY 07/16/19   Tanda Rockers, MD  cephALEXin (KEFLEX) 500 MG capsule Take 1 capsule (500 mg total) by mouth 4 (four) times daily. 08/17/19   Burchette, Alinda Sierras, MD  diazepam (VALIUM) 5 MG tablet TAKE ONE TABLET AT BEDTIME 05/30/19   Burchette, Alinda Sierras, MD  doxazosin (CARDURA) 4 MG tablet TAKE 1 TABLET DAILY. STOP LOSARTAN 07/30/19   [name], MD  fluconazole (DIFLUCAN)  150 MG tablet Take 1 tablet by mouth once for 1 dose. 08/17/19   Burchette, Alinda Sierras, MD  furosemide (LASIX) 40 MG tablet TAKE 1 TABLET DAILY. MAY TAKE 1 EXTRA AS NEEDED FOR SWELLING 07/18/19   [name], MD  ondansetron (ZOFRAN ODT) 4 MG disintegrating tablet Take 1 tablet (4 mg total) by mouth every 8 (eight) hours as needed for nausea or vomiting. 08/17/19   Burchette, Alinda Sierras, MD  OVER THE COUNTER MEDICATION Apply 1 application topically at bedtime as needed (Leg Pain). Magni-Life topical cream for leg pain.    [provider]  potassium chloride (KLOR-CON) 10 MEQ tablet If taking 1 lasix tab- Take 2 tablets by mouth twice a day.  If 2 tabs of lasix, then take 5 tabs of potassium 07/07/19   Tonia Ghent, MD  rosuvastatin (CRESTOR) 5 MG tablet Take 2.5mg  every Monday and Friday for 2 weeks, then increase to 5mg  every Monday and Friday if able to tolerate. 08/06/19  [name], MD  triamterene-hydrochlorothiazide [facility]) 37.5-25 MG tablet Take 1 tablet by mouth daily. 11/15/18   [name], MD  warfarin (COUMADIN) 2 MG tablet Take 1 tablet daily except 2 on Mondays or AS DIRECTED BY ANTICOAGULATION CLINIC  90 DAY 07/30/19   Burchette, Alinda Sierras, MD    Family History Family History  Problem Relation Age of Onset   Leukemia Father    Breast cancer Neg Hx     Social History Social History   Tobacco Use   Smoking status: Former Smoker    Packs/day: 1.00    Years: 15.00    Pack years: 15.00    Types: Cigarettes    Quit date: 09/28/1975    Years since quitting: 43.9   Smokeless tobacco: Never Used  Substance Use Topics   Alcohol use: No    Alcohol/week: 0.0 standard drinks   Drug use: No     Allergies   Aldactone [spironolactone], Amoxicillin, Diltiazem, Flagyl [metronidazole hcl], Flovent [fluticasone propionate], Gabapentin, Lyrica [pregabalin], Quinidine, Simvastatin, Tramadol, Verapamil, Amlodipine, Zetia [ezetimibe], Ace inhibitors,  Benazepril hcl, Ciprocin-fluocin-procin [fluocinolone acetonide], Ciprofloxacin, Codeine, and Nitrofurantoin monohyd macro   Review of Systems Review of Systems  Constitutional: Positive for fever. Negative for chills.  HENT: Negative for ear pain and sore throat.   Eyes: Negative for pain and visual disturbance.  Respiratory: Positive for cough and shortness of breath.   Cardiovascular: Negative for chest pain and palpitations.  Gastrointestinal: Negative for abdominal pain and vomiting.  Genitourinary: Negative for dysuria and hematuria.  Musculoskeletal: Negative for arthralgias and back pain.  Skin: Negative for color change and rash.  Neurological: Negative for seizures and syncope.  All other systems reviewed and are negative.    Physical Exam Updated Vital Signs BP (!) 123/59    Pulse (!) 57    Temp 97.9 F (36.6 C) (Oral)    Resp 11    Ht 5\' 4"  (1.626 m)    Wt 63 kg    SpO2 97%    BMI 23.86 kg/m   Physical Exam Vitals signs and nursing note reviewed.  Constitutional:      General: She is not in acute distress.    Appearance: She is well-developed. She is not ill-appearing.  HENT:     Head: Normocephalic and atraumatic.     Nose: Nose normal.     Mouth/Throat:     Mouth: Mucous membranes are moist.  Eyes:     Extraocular Movements: Extraocular movements intact.     Conjunctiva/sclera: Conjunctivae normal.     Pupils: Pupils are equal, round, and reactive to light.  Neck:     Musculoskeletal: Normal range of motion and neck supple.  Cardiovascular:     Rate and Rhythm: Normal rate and regular rhythm.     Pulses: Normal pulses.     Heart sounds: No murmur.  Pulmonary:     Effort: Pulmonary effort is normal. No respiratory distress.  Abdominal:     Palpations: Abdomen is soft.     Tenderness: There is no abdominal tenderness.  Musculoskeletal:     Right lower leg: No edema.     Left lower leg: No edema.  Skin:    General: Skin is warm and dry.     Capillary  Refill: Capillary refill takes less than 2 seconds.  Neurological:     General: No focal deficit present.     Mental Status: She is alert.  Psychiatric:        Mood and Affect:  Mood normal.      ED Treatments / Results  Labs (all labs ordered are listed, but only abnormal results are displayed) Labs Reviewed - No data to display  EKG EKG Interpretation  Date/Time:  Wednesday August 29 2019 11:27:50 EST Ventricular Rate:  60 PR Interval:    QRS Duration: 86 QT Interval:  450 QTC Calculation: 450 R Axis:   61 Text Interpretation: Atrial fibrillation Anteroseptal infarct, old No significant change since last tracing Confirmed by Lennice Sites [phone]) on 08/29/2019 11:31:05 AM   Radiology Dg Chest Portable 1 View  Result Date: 08/29/2019 CLINICAL DATA:  Shortness of breath EXAM: PORTABLE CHEST 1 VIEW COMPARISON:  October 30, 2018 FINDINGS: Small left pleural effusion and adjacent atelectasis. No pneumothorax. Normal heart size with evidence of prior CABG and valve replacement. Cervicothoracic fusion hardware is noted IMPRESSION: Small left pleural effusion and adjacent atelectasis. Electronically Signed   By: Macy Mis M.D.   On: 08/29/2019 11:47    Procedures Procedures (including critical care time)  Medications Ordered in ED Medications - No data to display   Initial Impression / Assessment and Plan / ED Course  I have reviewed the triage vital signs and the nursing notes.  Pertinent labs & imaging results that were available during my care of the patient were reviewed by me and considered in my medical decision making (see chart for details).        Andee TACORA FICCO is an 84 year old female with history of COPD, CAD who presents to the ED with flulike symptoms.  Patient with unremarkable vitals.  No fever.  Patient positive for coronavirus this past week.  Has been on antibiotics.  Has had continued fever and chills.  However she feels much better this morning.   She was having some shortness of breath with exertion and was told by primary care doctor to come for evaluation.  However patient has normal vitals.  No hypoxia, no tachycardia.  Overall normal work of breathing.  Chest x-ray showed small left pleural effusion with atelectasis but no focal pneumonia, no large viral changes.  Overall patient appears well.  She states that she feels the worst when she has a fever but gets better with Tylenol.  Recommend continue Tylenol, nutrition.  Overall patient appears well no signs of respiratory distress.  Normal oxygen with ambulation.  No shortness of breath or chest pain.  EKG showed rate controlled atrial fibrillation.  Doubt any acute cardiac or pulmonary process.  Likely resolving Covid infection.  Understands return precautions and discharged from the ED in good condition.  This chart was dictated using voice recognition software.  Despite best efforts to proofread,  errors can occur which can change the documentation meaning.    KEIERA WEISLER was evaluated in Emergency Department on 08/29/2019 for the symptoms described in the history of present illness. She was evaluated in the context of the global COVID-19 pandemic, which necessitated consideration that the patient might be at risk for infection with the SARS-CoV-2 virus that causes COVID-19. Institutional protocols and algorithms that pertain to the evaluation of patients at risk for COVID-19 are in a state of rapid change based on information released by regulatory bodies including the CDC and federal and state organizations. These policies and algorithms were followed during the patient's care in the ED.   Final Clinical Impressions(s) / ED Diagnoses   Final diagnoses:  [id number]    ED Discharge Orders    None  Lennice Sites, DO 08/29/19 1245

## 2019-09-02 ENCOUNTER — Emergency Department (HOSPITAL_BASED_OUTPATIENT_CLINIC_OR_DEPARTMENT_OTHER)
Admission: EM | Admit: 2019-09-02 | Discharge: 2019-09-02 | Disposition: A | Discharge status: Home / Self Care | Payer: Medicare Other | Attending: Emergency Medicine | Admitting: Emergency Medicine

## 2019-09-02 ENCOUNTER — Encounter (HOSPITAL_BASED_OUTPATIENT_CLINIC_OR_DEPARTMENT_OTHER): Payer: Self-pay | Admitting: Emergency Medicine

## 2019-09-02 ENCOUNTER — Other Ambulatory Visit: Payer: Self-pay

## 2019-09-02 ENCOUNTER — Emergency Department (HOSPITAL_BASED_OUTPATIENT_CLINIC_OR_DEPARTMENT_OTHER): Payer: Medicare Other

## 2019-09-02 DIAGNOSIS — Z952 Presence of prosthetic heart valve: Secondary | ICD-10-CM | POA: Insufficient documentation

## 2019-09-02 DIAGNOSIS — J449 Chronic obstructive pulmonary disease, unspecified: Secondary | ICD-10-CM | POA: Diagnosis not present

## 2019-09-02 DIAGNOSIS — Z7982 Long term (current) use of aspirin: Secondary | ICD-10-CM | POA: Insufficient documentation

## 2019-09-02 DIAGNOSIS — R1110 Vomiting, unspecified: Secondary | ICD-10-CM | POA: Diagnosis not present

## 2019-09-02 DIAGNOSIS — I130 Hypertensive heart and chronic kidney disease with heart failure and stage 1 through stage 4 chronic kidney disease, or unspecified chronic kidney disease: Secondary | ICD-10-CM | POA: Diagnosis not present

## 2019-09-02 DIAGNOSIS — Z79899 Other long term (current) drug therapy: Secondary | ICD-10-CM | POA: Insufficient documentation

## 2019-09-02 DIAGNOSIS — Z87891 Personal history of nicotine dependence: Secondary | ICD-10-CM | POA: Diagnosis not present

## 2019-09-02 DIAGNOSIS — N189 Chronic kidney disease, unspecified: Secondary | ICD-10-CM | POA: Diagnosis not present

## 2019-09-02 DIAGNOSIS — K529 Noninfective gastroenteritis and colitis, unspecified: Secondary | ICD-10-CM | POA: Diagnosis not present

## 2019-09-02 DIAGNOSIS — R112 Nausea with vomiting, unspecified: Secondary | ICD-10-CM | POA: Insufficient documentation

## 2019-09-02 DIAGNOSIS — R0602 Shortness of breath: Secondary | ICD-10-CM | POA: Diagnosis not present

## 2019-09-02 DIAGNOSIS — I5032 Chronic diastolic (congestive) heart failure: Secondary | ICD-10-CM | POA: Insufficient documentation

## 2019-09-02 DIAGNOSIS — Z951 Presence of aortocoronary bypass graft: Secondary | ICD-10-CM | POA: Diagnosis not present

## 2019-09-02 DIAGNOSIS — R109 Unspecified abdominal pain: Secondary | ICD-10-CM | POA: Diagnosis present

## 2019-09-02 DIAGNOSIS — U071 COVID-19: Secondary | ICD-10-CM | POA: Diagnosis not present

## 2019-09-02 LAB — TROPONIN I (HIGH SENSITIVITY)
Troponin I (High Sensitivity): 13 ng/L (ref <18)
Troponin I (High Sensitivity): 16 ng/L (ref <18)

## 2019-09-02 LAB — CBC WITH DIFFERENTIAL/PLATELET
Abs Immature Granulocytes: 0.09 10*3/uL — ABNORMAL HIGH (ref 0.00–0.07)
Basophils Absolute: 0 10*3/uL (ref 0.0–0.1)
Basophils Relative: 0 %
Eosinophils Absolute: 0 10*3/uL (ref 0.0–0.5)
Eosinophils Relative: 0 %
HCT: 39 % (ref 36.0–46.0)
Hemoglobin: 13.2 g/dL (ref 12.0–15.0)
Immature Granulocytes: 1 %
Lymphocytes Relative: 9 %
Lymphs Abs: 1.6 10*3/uL (ref 0.7–4.0)
MCH: 29.7 pg (ref 26.0–34.0)
MCHC: 33.8 g/dL (ref 30.0–36.0)
MCV: 87.8 fL (ref 80.0–100.0)
Monocytes Absolute: 0.6 10*3/uL (ref 0.1–1.0)
Monocytes Relative: 3 %
Neutro Abs: 15.5 10*3/uL — ABNORMAL HIGH (ref 1.7–7.7)
Neutrophils Relative %: 87 %
Platelets: 300 10*3/uL (ref 150–400)
RBC: 4.44 MIL/uL (ref 3.87–5.11)
RDW: 13 % (ref 11.5–15.5)
WBC: 17.8 10*3/uL — ABNORMAL HIGH (ref 4.0–10.5)
nRBC: 0 % (ref 0.0–0.2)

## 2019-09-02 LAB — COMPREHENSIVE METABOLIC PANEL
ALT: 26 U/L (ref 0–44)
AST: 30 U/L (ref 15–41)
Albumin: 3.7 g/dL (ref 3.5–5.0)
Alkaline Phosphatase: 88 U/L (ref 38–126)
Anion gap: 11 (ref 5–15)
BUN: 16 mg/dL (ref 8–23)
CO2: 25 mmol/L (ref 22–32)
Calcium: 8.8 mg/dL — ABNORMAL LOW (ref 8.9–10.3)
Chloride: 102 mmol/L (ref 98–111)
Creatinine, Ser: 0.86 mg/dL (ref 0.44–1.00)
GFR calc Af Amer: >60 mL/min (ref >60)
GFR calc non Af Amer: >60 mL/min (ref >60)
Glucose, Bld: 156 mg/dL — ABNORMAL HIGH (ref 70–99)
Potassium: 3.1 mmol/L — ABNORMAL LOW (ref 3.5–5.1)
Sodium: 138 mmol/L (ref 135–145)
Total Bilirubin: 0.8 mg/dL (ref 0.3–1.2)
Total Protein: 7 g/dL (ref 6.5–8.1)

## 2019-09-02 LAB — URINALYSIS, ROUTINE W REFLEX MICROSCOPIC
Bilirubin Urine: NEGATIVE
Glucose, UA: NEGATIVE mg/dL
Hgb urine dipstick: NEGATIVE
Ketones, ur: 15 mg/dL — AB
Leukocytes,Ua: NEGATIVE
Nitrite: NEGATIVE
Protein, ur: NEGATIVE mg/dL
Specific Gravity, Urine: 1.025 (ref 1.005–1.030)
pH: 6 (ref 5.0–8.0)

## 2019-09-02 LAB — LIPASE, BLOOD: Lipase: 30 U/L (ref 11–51)

## 2019-09-02 MED ORDER — ONDANSETRON HCL 4 MG/2ML IJ SOLN
4.0000 mg | Freq: Once | INTRAMUSCULAR | Status: AC
  Administered 2019-09-02: 4 mg via INTRAVENOUS
  Filled 2019-09-02: qty 2

## 2019-09-02 MED ORDER — MORPHINE SULFATE (PF) 4 MG/ML IV SOLN
4.0000 mg | Freq: Once | INTRAVENOUS | Status: AC
  Administered 2019-09-02: 4 mg via INTRAVENOUS
  Filled 2019-09-02: qty 1

## 2019-09-02 MED ORDER — IOHEXOL 300 MG/ML  SOLN
80.0000 mL | Freq: Once | INTRAMUSCULAR | Status: AC | PRN
  Administered 2019-09-02: 80 mL via INTRAVENOUS

## 2019-09-02 MED ORDER — PROMETHAZINE HCL 25 MG PO TABS
ORAL_TABLET | ORAL | Status: AC
  Filled 2019-09-02: qty 1

## 2019-09-02 MED ORDER — PROMETHAZINE HCL 25 MG PO TABS
12.5000 mg | ORAL_TABLET | Freq: Once | ORAL | Status: AC
  Administered 2019-09-02: 12.5 mg via ORAL

## 2019-09-02 MED ORDER — PROMETHAZINE HCL 25 MG PO TABS: 12.5000 mg | ORAL_TABLET | Freq: Four times a day (QID) | ORAL | 0 refills | Status: DC | PRN

## 2019-09-02 NOTE — ED Notes (Signed)
ED Provider at bedside. 

## 2019-09-02 NOTE — ED Triage Notes (Signed)
Pt reports abd pain starting yesterday around lunch time. States nausea and vomiting x4 today. Reports covid+ test late last month 11/28, states today is her last day of quarantine. States she feels like "rocks" are in her stomach. VS WDL in triage.

## 2019-09-02 NOTE — ED Notes (Signed)
PO challenge given  

## 2019-09-02 NOTE — ED Notes (Signed)
Pt made aware of need of urine sample. Commode at bedside

## 2019-09-02 NOTE — Discharge Instructions (Addendum)
You were seen today for nausea, vomiting, and abdominal pain.  This is likely related to inflammation of your intestine.  This can be seen in viral illnesses including COVID-19.  Take Phenergan as needed for nausea.  Make sure that you are staying hydrated with electrolyte rich fluids.  If you develop worsening nausea, inability to tolerate fluids, or any new or worsening symptoms you should be reevaluated.

## 2019-09-02 NOTE — ED Notes (Signed)
Updated husband Will via telephone.

## 2019-09-02 NOTE — ED Notes (Signed)
Pt initially passed PO challenge, however began to vomit after position changes. EDP notified, see new orders.

## 2019-09-02 NOTE — ED Provider Notes (Signed)
[facility] Provider Note   CSN: [id number] Arrival date & time: 09/02/19  0125     History   Chief Complaint Chief Complaint  Patient presents with  . Abdominal Pain    HPI Catherine [name] is a 84 y.o. female.     HPI  This is an 84 year old female with a history of atrial fibrillation, hypertension, chronic kidney disease and recent diagnosis of COVID-19 who presents with abdominal pain.  Patient reports since her diagnosis of COVID-19 in mid to late November, she has had waxing and waning symptoms.  She states 1 day she feels fine and the next day she feels bad again.  She has not had a temperature in over 1 week.  She reports over the last 24 hours she has developed abdominal pain.  She describes "stones in my stomach."  She rates her pain 8 out of 10.  She did not take anything for the pain.  Initially she had associated nausea but has since developed nonbilious, nonbloody emesis.  She reports several small bowel movements prior to arrival.  She denies chest pain or shortness of breath.    Past Medical History:  Diagnosis Date  . Aortic stenosis, mild 07/26/2017  . Arthritis   . Asthma    last attack 02/2015  . Atrial fibrillation, chronic ([place])   . Cellulitis of left lower extremity 08/17/2016   Ulcer associated with severe venous insufficiency  . Chronic anticoagulation   . Chronic diastolic CHF (congestive heart failure) ([place])   . Chronic kidney disease    "RIGHT MANY KIDNEY INFECTIONS AND STONES"  . COPD (chronic obstructive pulmonary disease) ([place])   . Coronary artery disease   . Dizziness   . H/O: rheumatic fever   . Heart murmur   . Hypertension   . PONV (postoperative nausea and vomiting)    ' SOMETIMES', BUT NOT ALWAYS"  . S/P Maze operation for atrial fibrillation 10/14/2016   Complete bilateral atrial lesion set using cryothermy and bipolar radiofrequency ablation - atrial appendage was not treated due to previous surgical  procedure (open mitral commissurotomy)  . S/P mitral valve replacement with bioprosthetic valve 10/14/2016   29 mm Medtronic Mosaic porcine bioprosthetic tissue valve  . UTI (urinary tract infection)   . Valvular heart disease    Has mitral stenosis with prior mitral commissurotomy in 1970    Patient Active Problem List   Diagnosis Date Noted  . COVID-19 08/27/2019  . Dysuria 07/07/2019  . Chronic anxiety 12/23/2017  . Aortic stenosis, mild 07/26/2017  . Dyslipidemia 12/20/2016  . S/P mitral valve replacement with bioprosthetic valve + CABG x1 + maze procedure 10/14/2016  . S/P Maze operation for atrial fibrillation 10/14/2016  . S/P CABG x 1 10/14/2016  . Cellulitis of left lower extremity 08/17/2016  . Chronic diastolic CHF (congestive heart failure) ([place])   . Coronary artery disease   . Chronic insomnia 07/12/2016  . Dyspnea 03/26/2015  . Atrial fibrillation, chronic ([place]) 03/16/2015  . Warfarin-induced coagulopathy ([place]) 03/16/2015  . Valvular heart disease 03/16/2015  . COPD GOLD II  03/16/2015  . Nephrolithiasis 03/16/2015  . Essential hypertension   . RHEUMATIC MITRAL STENOSIS 01/05/2008  . Rheumatic heart disease 01/05/2008  . Severe mitral insufficiency 01/05/2008  . Atrial fibrillation ([place]) 01/05/2008    Past Surgical History:  Procedure Laterality Date  . ABDOMINAL HYSTERECTOMY  1983   endometriosis  . APPENDECTOMY    . BACK SURGERY     neurosurgery x2  .  CARDIAC CATHETERIZATION    . CARDIAC CATHETERIZATION N/A 08/03/2016   Procedure: Right/Left Heart Cath and Coronary Angiography;  Surgeon: Peter M Martinique, MD;  Location: [facility];  Service: Cardiovascular;  Laterality: N/A;  . cataract surg    . CHOLECYSTECTOMY N/A 05/30/2015   Procedure: LAPAROSCOPIC CHOLECYSTECTOMY WITH INTRAOPERATIVE CHOLANGIOGRAM;  Surgeon: Excell Seltzer, MD;  Location: [facility];  Service: General;  Laterality: N/A;  . COLONOSCOPY    . CORONARY ARTERY BYPASS GRAFT N/A 10/14/2016    Procedure: CORONARY ARTERY BYPASS GRAFTING (CABG);  Surgeon: Rexene Alberts, MD;  Location: [facility];  Service: Open Heart Surgery;  Laterality: N/A;  . EYE SURGERY    . MAZE N/A 10/14/2016   Procedure: MAZE;  Surgeon: Rexene Alberts, MD;  Location: [facility];  Service: Open Heart Surgery;  Laterality: N/A;  . MITRAL VALVE REPLACEMENT N/A 10/14/2016   Procedure: REDO MITRAL VALVE REPLACEMENT (MVR);  Surgeon: Rexene Alberts, MD;  Location: [facility];  Service: Open Heart Surgery;  Laterality: N/A;  . MITRAL VALVE SURGERY Left 1970   Open mitral commissurotomy via left thoracotomy approach  . TEE WITHOUT CARDIOVERSION N/A 07/05/2016   Procedure: TRANSESOPHAGEAL ECHOCARDIOGRAM (TEE);  Surgeon: [name], MD;  Location: [facility];  Service: Cardiovascular;  Laterality: N/A;  . TEE WITHOUT CARDIOVERSION N/A 10/14/2016   Procedure: TRANSESOPHAGEAL ECHOCARDIOGRAM (TEE);  Surgeon: Rexene Alberts, MD;  Location: [facility];  Service: Open Heart Surgery;  Laterality: N/A;     OB History   No obstetric history on file.      Home Medications    Prior to Admission medications   Medication Sig Start Date End Date Taking? Authorizing Provider  acetaminophen (TYLENOL) 500 MG tablet Take 500 mg by mouth every 6 (six) hours as needed for mild pain.    [provider]  albuterol (PROVENTIL HFA;VENTOLIN HFA) 108 (90 Base) MCG/ACT inhaler Inhale 2 puffs into the lungs every 6 (six) hours as needed for wheezing or shortness of breath. 11/06/18   Burchette, Alinda Sierras, MD  aspirin EC 81 MG EC tablet Take 1 tablet (81 mg total) by mouth daily. 10/22/16   Gold, Wayne E, PA-C  benzonatate (TESSALON) 100 MG capsule Take 1 capsule (100 mg total) by mouth 3 (three) times daily as needed for cough. 08/15/19   Burchette, Alinda Sierras, MD  budesonide-formoterol (SYMBICORT) 80-4.5 MCG/ACT inhaler 2 PUFFS 2 TIMES A DAY 07/16/19   Tanda Rockers, MD  cephALEXin (KEFLEX) 500 MG capsule Take 1 capsule (500 mg total) by mouth 4  (four) times daily. 08/17/19   Burchette, Alinda Sierras, MD  diazepam (VALIUM) 5 MG tablet TAKE ONE TABLET AT BEDTIME 05/30/19   Burchette, Alinda Sierras, MD  doxazosin (CARDURA) 4 MG tablet TAKE 1 TABLET DAILY. STOP LOSARTAN 07/30/19   [name], MD  fluconazole (DIFLUCAN) 150 MG tablet Take 1 tablet by mouth once for 1 dose. 08/17/19   Burchette, Alinda Sierras, MD  furosemide (LASIX) 40 MG tablet TAKE 1 TABLET DAILY. MAY TAKE 1 EXTRA AS NEEDED FOR SWELLING 07/18/19   [name], MD  ondansetron (ZOFRAN ODT) 4 MG disintegrating tablet Take 1 tablet (4 mg total) by mouth every 8 (eight) hours as needed for nausea or vomiting. 08/17/19   Burchette, Alinda Sierras, MD  OVER THE COUNTER MEDICATION Apply 1 application topically at bedtime as needed (Leg Pain). Magni-Life topical cream for leg pain.    [provider]  potassium chloride (KLOR-CON) 10 MEQ tablet If taking 1  lasix tab- Take 2 tablets by mouth twice a day.  If 2 tabs of lasix, then take 5 tabs of potassium 07/07/19   Tonia Ghent, MD  promethazine (PHENERGAN) 25 MG tablet Take 0.5 tablets (12.5 mg total) by mouth every 6 (six) hours as needed for nausea or vomiting. 09/02/19   Merryl Hacker, MD  rosuvastatin (CRESTOR) 5 MG tablet Take 2.5mg  every Monday and Friday for 2 weeks, then increase to 5mg  every Monday and Friday if able to tolerate. 08/06/19   [name], MD  triamterene-hydrochlorothiazide (MAXZIDE-25) 37.5-25 MG tablet Take 1 tablet by mouth daily. 11/15/18   [name], MD  warfarin (COUMADIN) 2 MG tablet Take 1 tablet daily except 2 on Mondays or [place]  90 DAY 07/30/19   Burchette, Alinda Sierras, MD    Family History Family History  Problem Relation Age of Onset  . Leukemia Father   . Breast cancer Neg Hx     Social History Social History   Tobacco Use  . Smoking status: Former Smoker    Packs/day: 1.00    Years: 15.00    Pack years: 15.00    Types: Cigarettes    Quit date:  09/28/1975    Years since quitting: 43.9  . Smokeless tobacco: Never Used  Substance Use Topics  . Alcohol use: No    Alcohol/week: 0.0 standard drinks  . Drug use: No     Allergies   Aldactone [spironolactone], Amoxicillin, Diltiazem, Flagyl [metronidazole hcl], Flovent [fluticasone propionate], Gabapentin, Lyrica [pregabalin], Quinidine, Simvastatin, Tramadol, Verapamil, Amlodipine, Zetia [ezetimibe], Ace inhibitors, Benazepril hcl, Ciprocin-fluocin-procin [fluocinolone acetonide], Ciprofloxacin, Codeine, and Nitrofurantoin monohyd macro   Review of Systems Review of Systems  Constitutional: Negative for fever.  Respiratory: Negative for shortness of breath.   Cardiovascular: Negative for chest pain.  Gastrointestinal: Positive for abdominal pain, nausea and vomiting. Negative for constipation and diarrhea.  Genitourinary: Negative for dysuria.  Musculoskeletal: Negative for back pain.  All other systems reviewed and are negative.    Physical Exam Updated Vital Signs BP (!) 105/58   Pulse 68   Temp 98.4 F (36.9 C)   Resp 17   Ht 1.626 m (5\' 4" )   Wt 62.6 kg   SpO2 92%   BMI 23.69 kg/m   Physical Exam Vitals signs and nursing note reviewed.  Constitutional:      Appearance: She is well-developed. She is not ill-appearing.     Comments: Elderly, non-ill-appearing  HENT:     Head: Normocephalic and atraumatic.  Eyes:     Pupils: Pupils are equal, round, and reactive to light.  Neck:     Musculoskeletal: Neck supple.  Cardiovascular:     Rate and Rhythm: Normal rate and regular rhythm.     Heart sounds: Normal heart sounds.  Pulmonary:     Effort: Pulmonary effort is normal. No respiratory distress.  Abdominal:     General: Bowel sounds are normal.     Palpations: Abdomen is soft.     Tenderness: There is abdominal tenderness in the periumbilical area. There is no guarding or rebound.  Skin:    General: Skin is warm and dry.  Neurological:     Mental Status:  She is alert and oriented to person, place, and time.  Psychiatric:        Mood and Affect: Mood normal.      ED Treatments / Results  Labs (all labs ordered are listed, but only abnormal results are displayed) Labs  Reviewed  CBC WITH DIFFERENTIAL/PLATELET - Abnormal; Notable for the following components:      Result Value   WBC 17.8 (*)    Neutro Abs 15.5 (*)    Abs Immature Granulocytes 0.09 (*)    All other components within normal limits  COMPREHENSIVE METABOLIC PANEL - Abnormal; Notable for the following components:   Potassium 3.1 (*)    Glucose, Bld 156 (*)    Calcium 8.8 (*)    All other components within normal limits  URINALYSIS, ROUTINE W REFLEX MICROSCOPIC - Abnormal; Notable for the following components:   APPearance HAZY (*)    Ketones, ur 15 (*)    All other components within normal limits  LIPASE, BLOOD  TROPONIN I (HIGH SENSITIVITY)  TROPONIN I (HIGH SENSITIVITY)    EKG EKG Interpretation  Date/Time:  Sunday September 02 2019 02:12:05 EST Ventricular Rate:  63 PR Interval:    QRS Duration: 93 QT Interval:  438 QTC Calculation: 449 R Axis:   62 Text Interpretation: Slow atrial fibrillation Probable anteroseptal infarct, old Repol abnrm, severe global ischemia (LM/MVD) Depressions inferior and laterally worse when compared to prior Confirmed by Thayer Jew [phone]) on 09/02/2019 2:42:55 AM   Radiology No results found.  Procedures Procedures (including critical care time)  Medications Ordered in ED Medications  morphine 4 MG/ML injection 4 mg (4 mg Intravenous Given 09/02/19 0202)  ondansetron (ZOFRAN) injection 4 mg (4 mg Intravenous Given 09/02/19 0202)  iohexol (OMNIPAQUE) 300 MG/ML solution 80 mL (80 mLs Intravenous Contrast Given 09/02/19 0335)  morphine 4 MG/ML injection 4 mg (4 mg Intravenous Given 09/02/19 0453)  ondansetron (ZOFRAN) injection 4 mg (4 mg Intravenous Given 09/02/19 0453)     Initial Impression / Assessment and Plan / ED  Course  I have reviewed the triage vital signs and the nursing notes.  Pertinent labs & imaging results that were available during my care of the patient were reviewed by me and considered in my medical decision making (see chart for details).        Patient presents with abdominal pain, nausea, vomiting.  She is overall nontoxic-appearing.  Recently diagnosed with COVID-19 and reports waxing and waning symptoms at home.  No persistent fevers.  She has some mild periumbilical tenderness on exam but no signs of peritonitis.  Considerations include viral etiology, pancreatitis, early appendicitis.  She is status post cholecystectomy.  Patient given pain and nausea medication.  EKG obtained given nausea.  She does have some nonspecific depressions.  History of heart disease.  Will obtain troponin.  Troponin x2 is reassuring with delta Troponin of 3.  Doubt primary ACS.  Lab work is notable for leukocytosis to 17.8.  Urinalysis without evidence of UTI.  Lipase and liver function testing normal.  CT scan obtained given leukocytosis in the setting of abdominal pain in an elderly female.  CT scan is just notable for some inflammation suspicious of enteritis.  Suspect this may be related to her Covid diagnosis.  Patient is able to tolerate fluids.  She has no significant metabolic derangements.  She reports that Zofran does not work well for her.  We will discharge her with a small dose of Phenergan.  Recommend hydration and supportive measures at home.  After history, exam, and medical workup I feel the patient has been appropriately medically screened and is safe for discharge home. Pertinent diagnoses were discussed with the patient. Patient was given return precautions.   Final Clinical Impressions(s) / ED Diagnoses   Final  diagnoses:  Enteritis  COVID-19    ED Discharge Orders         Ordered    promethazine (PHENERGAN) 25 MG tablet  Every 6 hours PRN     09/02/19 0610           Merryl Hacker, MD 09/02/19 [phone]

## 2019-09-02 NOTE — ED Notes (Signed)
Patient transported to CT 

## 2019-09-02 NOTE — ED Notes (Signed)
Assisted patient with bedside commode.  

## 2019-09-02 NOTE — ED Notes (Signed)
Attempted IV access in R wrist with no success. Second attempt successful, see flowsheet.

## 2019-09-03 ENCOUNTER — Telehealth: Payer: Self-pay

## 2019-09-03 ENCOUNTER — Encounter (HOSPITAL_COMMUNITY): Payer: Self-pay | Admitting: Emergency Medicine

## 2019-09-03 ENCOUNTER — Inpatient Hospital Stay (HOSPITAL_COMMUNITY)
Admission: EM | Admit: 2019-09-03 | Discharge: 2019-09-06 | DRG: 177 | Disposition: A | Discharge status: Home / Self Care | Payer: Medicare Other | Attending: Internal Medicine | Admitting: Internal Medicine

## 2019-09-03 ENCOUNTER — Inpatient Hospital Stay (HOSPITAL_COMMUNITY): Payer: Medicare Other

## 2019-09-03 ENCOUNTER — Other Ambulatory Visit: Payer: Self-pay

## 2019-09-03 DIAGNOSIS — Z743 Need for continuous supervision: Secondary | ICD-10-CM | POA: Diagnosis not present

## 2019-09-03 DIAGNOSIS — I2510 Atherosclerotic heart disease of native coronary artery without angina pectoris: Secondary | ICD-10-CM | POA: Diagnosis not present

## 2019-09-03 DIAGNOSIS — Z952 Presence of prosthetic heart valve: Secondary | ICD-10-CM

## 2019-09-03 DIAGNOSIS — Z885 Allergy status to narcotic agent status: Secondary | ICD-10-CM

## 2019-09-03 DIAGNOSIS — J440 Chronic obstructive pulmonary disease with acute lower respiratory infection: Secondary | ICD-10-CM | POA: Diagnosis present

## 2019-09-03 DIAGNOSIS — R791 Abnormal coagulation profile: Secondary | ICD-10-CM | POA: Diagnosis not present

## 2019-09-03 DIAGNOSIS — Z7901 Long term (current) use of anticoagulants: Secondary | ICD-10-CM

## 2019-09-03 DIAGNOSIS — Z87440 Personal history of urinary (tract) infections: Secondary | ICD-10-CM

## 2019-09-03 DIAGNOSIS — I5032 Chronic diastolic (congestive) heart failure: Secondary | ICD-10-CM | POA: Diagnosis not present

## 2019-09-03 DIAGNOSIS — M1990 Unspecified osteoarthritis, unspecified site: Secondary | ICD-10-CM | POA: Diagnosis present

## 2019-09-03 DIAGNOSIS — I130 Hypertensive heart and chronic kidney disease with heart failure and stage 1 through stage 4 chronic kidney disease, or unspecified chronic kidney disease: Secondary | ICD-10-CM | POA: Diagnosis present

## 2019-09-03 DIAGNOSIS — U071 COVID-19: Secondary | ICD-10-CM | POA: Diagnosis not present

## 2019-09-03 DIAGNOSIS — J1289 Other viral pneumonia: Secondary | ICD-10-CM | POA: Diagnosis not present

## 2019-09-03 DIAGNOSIS — D689 Coagulation defect, unspecified: Secondary | ICD-10-CM | POA: Diagnosis present

## 2019-09-03 DIAGNOSIS — N179 Acute kidney failure, unspecified: Secondary | ICD-10-CM | POA: Diagnosis not present

## 2019-09-03 DIAGNOSIS — Z6824 Body mass index (BMI) 24.0-24.9, adult: Secondary | ICD-10-CM

## 2019-09-03 DIAGNOSIS — E876 Hypokalemia: Secondary | ICD-10-CM | POA: Diagnosis present

## 2019-09-03 DIAGNOSIS — R5381 Other malaise: Secondary | ICD-10-CM | POA: Diagnosis not present

## 2019-09-03 DIAGNOSIS — Z880 Allergy status to penicillin: Secondary | ICD-10-CM

## 2019-09-03 DIAGNOSIS — Z881 Allergy status to other antibiotic agents status: Secondary | ICD-10-CM

## 2019-09-03 DIAGNOSIS — E785 Hyperlipidemia, unspecified: Secondary | ICD-10-CM | POA: Diagnosis not present

## 2019-09-03 DIAGNOSIS — R0602 Shortness of breath: Secondary | ICD-10-CM | POA: Diagnosis not present

## 2019-09-03 DIAGNOSIS — Z7951 Long term (current) use of inhaled steroids: Secondary | ICD-10-CM

## 2019-09-03 DIAGNOSIS — N183 Chronic kidney disease, stage 3 unspecified: Secondary | ICD-10-CM | POA: Diagnosis not present

## 2019-09-03 DIAGNOSIS — Z7982 Long term (current) use of aspirin: Secondary | ICD-10-CM

## 2019-09-03 DIAGNOSIS — Z79899 Other long term (current) drug therapy: Secondary | ICD-10-CM

## 2019-09-03 DIAGNOSIS — E43 Unspecified severe protein-calorie malnutrition: Secondary | ICD-10-CM | POA: Insufficient documentation

## 2019-09-03 DIAGNOSIS — J9601 Acute respiratory failure with hypoxia: Secondary | ICD-10-CM | POA: Diagnosis present

## 2019-09-03 DIAGNOSIS — E860 Dehydration: Secondary | ICD-10-CM

## 2019-09-03 DIAGNOSIS — K529 Noninfective gastroenteritis and colitis, unspecified: Secondary | ICD-10-CM | POA: Diagnosis not present

## 2019-09-03 DIAGNOSIS — Z951 Presence of aortocoronary bypass graft: Secondary | ICD-10-CM

## 2019-09-03 DIAGNOSIS — R1084 Generalized abdominal pain: Secondary | ICD-10-CM | POA: Diagnosis not present

## 2019-09-03 DIAGNOSIS — I350 Nonrheumatic aortic (valve) stenosis: Secondary | ICD-10-CM | POA: Diagnosis not present

## 2019-09-03 DIAGNOSIS — T45515A Adverse effect of anticoagulants, initial encounter: Secondary | ICD-10-CM | POA: Diagnosis not present

## 2019-09-03 DIAGNOSIS — I482 Chronic atrial fibrillation, unspecified: Secondary | ICD-10-CM | POA: Diagnosis not present

## 2019-09-03 DIAGNOSIS — Z87891 Personal history of nicotine dependence: Secondary | ICD-10-CM

## 2019-09-03 DIAGNOSIS — A0839 Other viral enteritis: Secondary | ICD-10-CM | POA: Diagnosis present

## 2019-09-03 DIAGNOSIS — Z888 Allergy status to other drugs, medicaments and biological substances status: Secondary | ICD-10-CM

## 2019-09-03 LAB — CBC WITH DIFFERENTIAL/PLATELET
Abs Immature Granulocytes: 0.09 10*3/uL — ABNORMAL HIGH (ref 0.00–0.07)
Basophils Absolute: 0 10*3/uL (ref 0.0–0.1)
Basophils Relative: 0 %
Eosinophils Absolute: 0 10*3/uL (ref 0.0–0.5)
Eosinophils Relative: 0 %
HCT: 34.4 % — ABNORMAL LOW (ref 36.0–46.0)
Hemoglobin: 11.5 g/dL — ABNORMAL LOW (ref 12.0–15.0)
Immature Granulocytes: 1 %
Lymphocytes Relative: 9 %
Lymphs Abs: 1.7 10*3/uL (ref 0.7–4.0)
MCH: 30.4 pg (ref 26.0–34.0)
MCHC: 33.4 g/dL (ref 30.0–36.0)
MCV: 91 fL (ref 80.0–100.0)
Monocytes Absolute: 1.1 10*3/uL — ABNORMAL HIGH (ref 0.1–1.0)
Monocytes Relative: 6 %
Neutro Abs: 16.5 10*3/uL — ABNORMAL HIGH (ref 1.7–7.7)
Neutrophils Relative %: 84 %
Platelets: 300 10*3/uL (ref 150–400)
RBC: 3.78 MIL/uL — ABNORMAL LOW (ref 3.87–5.11)
RDW: 13.5 % (ref 11.5–15.5)
WBC: 19.5 10*3/uL — ABNORMAL HIGH (ref 4.0–10.5)
nRBC: 0 % (ref 0.0–0.2)

## 2019-09-03 LAB — COMPREHENSIVE METABOLIC PANEL
ALT: 20 U/L (ref 0–44)
AST: 23 U/L (ref 15–41)
Albumin: 3.4 g/dL — ABNORMAL LOW (ref 3.5–5.0)
Alkaline Phosphatase: 82 U/L (ref 38–126)
Anion gap: 14 (ref 5–15)
BUN: 26 mg/dL — ABNORMAL HIGH (ref 8–23)
CO2: 26 mmol/L (ref 22–32)
Calcium: 9.1 mg/dL (ref 8.9–10.3)
Chloride: 101 mmol/L (ref 98–111)
Creatinine, Ser: 1.13 mg/dL — ABNORMAL HIGH (ref 0.44–1.00)
GFR calc Af Amer: 52 mL/min — ABNORMAL LOW (ref >60)
GFR calc non Af Amer: 45 mL/min — ABNORMAL LOW (ref >60)
Glucose, Bld: 154 mg/dL — ABNORMAL HIGH (ref 70–99)
Potassium: 3.8 mmol/L (ref 3.5–5.1)
Sodium: 141 mmol/L (ref 135–145)
Total Bilirubin: 0.9 mg/dL (ref 0.3–1.2)
Total Protein: 6.9 g/dL (ref 6.5–8.1)

## 2019-09-03 LAB — URINALYSIS, ROUTINE W REFLEX MICROSCOPIC
Bilirubin Urine: NEGATIVE
Glucose, UA: NEGATIVE mg/dL
Hgb urine dipstick: NEGATIVE
Ketones, ur: NEGATIVE mg/dL
Leukocytes,Ua: NEGATIVE
Nitrite: NEGATIVE
Protein, ur: 30 mg/dL — AB
Specific Gravity, Urine: 1.035 — ABNORMAL HIGH (ref 1.005–1.030)
pH: 5 (ref 5.0–8.0)

## 2019-09-03 LAB — PROTIME-INR
INR: >10 (ref 0.8–1.2)
Prothrombin Time: >90 seconds — ABNORMAL HIGH (ref 11.4–15.2)

## 2019-09-03 LAB — LACTATE DEHYDROGENASE: LDH: 239 U/L — ABNORMAL HIGH (ref 98–192)

## 2019-09-03 LAB — TROPONIN I (HIGH SENSITIVITY): Troponin I (High Sensitivity): 20 ng/L — ABNORMAL HIGH (ref <18)

## 2019-09-03 LAB — LACTIC ACID, PLASMA: Lactic Acid, Venous: 1.7 mmol/L (ref 0.5–1.9)

## 2019-09-03 LAB — FERRITIN: Ferritin: 110 ng/mL (ref 11–307)

## 2019-09-03 LAB — C-REACTIVE PROTEIN: CRP: 4.7 mg/dL — ABNORMAL HIGH (ref <1.0)

## 2019-09-03 LAB — LIPASE, BLOOD: Lipase: 22 U/L (ref 11–51)

## 2019-09-03 MED ORDER — DEXAMETHASONE SODIUM PHOSPHATE 10 MG/ML IJ SOLN
10.0000 mg | Freq: Once | INTRAMUSCULAR | Status: AC
  Administered 2019-09-03: 11:00:00 10 mg via INTRAVENOUS
  Filled 2019-09-03: qty 1

## 2019-09-03 MED ORDER — SODIUM CHLORIDE 0.9 % IV SOLN
INTRAVENOUS | Status: DC | PRN
  Administered 2019-09-03: 11:00:00 500 mL via INTRAVENOUS

## 2019-09-03 MED ORDER — ONDANSETRON HCL 4 MG/2ML IJ SOLN: 4.0000 mg | Freq: Four times a day (QID) | INTRAMUSCULAR | Status: DC | PRN

## 2019-09-03 MED ORDER — METOCLOPRAMIDE HCL 5 MG/ML IJ SOLN
10.0000 mg | Freq: Once | INTRAMUSCULAR | Status: AC
  Administered 2019-09-03: 06:00:00 10 mg via INTRAVENOUS
  Filled 2019-09-03: qty 2

## 2019-09-03 MED ORDER — DIAZEPAM 5 MG PO TABS
5.0000 mg | ORAL_TABLET | Freq: Every day | ORAL | Status: DC
  Administered 2019-09-03 – 2019-09-05 (×3): 5 mg via ORAL
  Filled 2019-09-03 (×3): qty 1

## 2019-09-03 MED ORDER — ROSUVASTATIN CALCIUM 10 MG PO TABS
5.0000 mg | ORAL_TABLET | ORAL | Status: DC
  Administered 2019-09-03: 5 mg via ORAL
  Filled 2019-09-03: qty 1

## 2019-09-03 MED ORDER — SODIUM CHLORIDE 0.9 % IV SOLN
INTRAVENOUS | Status: DC
  Administered 2019-09-03: 05:00:00 via INTRAVENOUS

## 2019-09-03 MED ORDER — ACETAMINOPHEN 325 MG PO TABS
650.0000 mg | ORAL_TABLET | Freq: Four times a day (QID) | ORAL | Status: DC | PRN
  Administered 2019-09-03: 650 mg via ORAL
  Filled 2019-09-03 (×2): qty 2

## 2019-09-03 MED ORDER — ALBUTEROL SULFATE HFA 108 (90 BASE) MCG/ACT IN AERS
2.0000 | INHALATION_SPRAY | Freq: Four times a day (QID) | RESPIRATORY_TRACT | Status: DC | PRN
  Filled 2019-09-03: qty 6.7

## 2019-09-03 MED ORDER — PIPERACILLIN-TAZOBACTAM 3.375 G IVPB
3.3750 g | Freq: Three times a day (TID) | INTRAVENOUS | Status: DC
  Administered 2019-09-03 – 2019-09-06 (×9): 3.375 g via INTRAVENOUS
  Filled 2019-09-03 (×9): qty 50

## 2019-09-03 MED ORDER — BENZONATATE 100 MG PO CAPS
100.0000 mg | ORAL_CAPSULE | Freq: Three times a day (TID) | ORAL | Status: DC | PRN
  Administered 2019-09-05: 100 mg via ORAL
  Filled 2019-09-03: qty 1

## 2019-09-03 MED ORDER — ONDANSETRON HCL 4 MG/2ML IJ SOLN
4.0000 mg | Freq: Once | INTRAMUSCULAR | Status: AC
  Administered 2019-09-03: 4 mg via INTRAVENOUS
  Filled 2019-09-03: qty 2

## 2019-09-03 MED ORDER — SODIUM CHLORIDE 0.9 % IV BOLUS
500.0000 mL | Freq: Once | INTRAVENOUS | Status: AC
  Administered 2019-09-03: 500 mL via INTRAVENOUS

## 2019-09-03 MED ORDER — ACETAMINOPHEN 650 MG RE SUPP: 650.0000 mg | Freq: Four times a day (QID) | RECTAL | Status: DC | PRN

## 2019-09-03 MED ORDER — ONDANSETRON HCL 4 MG PO TABS: 4.0000 mg | ORAL_TABLET | Freq: Four times a day (QID) | ORAL | Status: DC | PRN

## 2019-09-03 MED ORDER — LACTATED RINGERS IV SOLN: INTRAVENOUS | Status: AC

## 2019-09-03 MED ORDER — PIPERACILLIN-TAZOBACTAM 3.375 G IVPB 30 MIN
3.3750 g | Freq: Once | INTRAVENOUS | Status: AC
  Administered 2019-09-03: 07:00:00 3.375 g via INTRAVENOUS
  Filled 2019-09-03: qty 50

## 2019-09-03 MED ORDER — DOXAZOSIN MESYLATE 2 MG PO TABS
4.0000 mg | ORAL_TABLET | Freq: Every day | ORAL | Status: DC
  Administered 2019-09-03 – 2019-09-06 (×4): 4 mg via ORAL
  Filled 2019-09-03 (×4): qty 2

## 2019-09-03 MED ORDER — SODIUM CHLORIDE 0.9 % IV SOLN
200.0000 mg | Freq: Once | INTRAVENOUS | Status: AC
  Administered 2019-09-03: 15:00:00 200 mg via INTRAVENOUS
  Filled 2019-09-03: qty 40

## 2019-09-03 MED ORDER — VITAMIN K1 10 MG/ML IJ SOLN
2.5000 mg | Freq: Once | INTRAVENOUS | Status: AC
  Administered 2019-09-03: 11:00:00 2.5 mg via INTRAVENOUS
  Filled 2019-09-03 (×2): qty 0.25

## 2019-09-03 MED ORDER — SODIUM CHLORIDE 0.9 % IV SOLN
100.0000 mg | Freq: Every day | INTRAVENOUS | Status: DC
  Administered 2019-09-04 – 2019-09-06 (×3): 100 mg via INTRAVENOUS
  Filled 2019-09-03 (×3): qty 20
  Filled 2019-09-03: qty 100

## 2019-09-03 MED ORDER — ASPIRIN EC 81 MG PO TBEC
81.0000 mg | DELAYED_RELEASE_TABLET | Freq: Every day | ORAL | Status: DC
  Administered 2019-09-03 – 2019-09-06 (×4): 81 mg via ORAL
  Filled 2019-09-03 (×4): qty 1

## 2019-09-03 MED ORDER — MOMETASONE FURO-FORMOTEROL FUM 100-5 MCG/ACT IN AERO
2.0000 | INHALATION_SPRAY | Freq: Two times a day (BID) | RESPIRATORY_TRACT | Status: DC
  Administered 2019-09-03 – 2019-09-06 (×6): 2 via RESPIRATORY_TRACT
  Filled 2019-09-03: qty 8.8

## 2019-09-03 MED ORDER — LABETALOL HCL 5 MG/ML IV SOLN: 10.0000 mg | INTRAVENOUS | Status: DC | PRN

## 2019-09-03 MED ORDER — DEXAMETHASONE SODIUM PHOSPHATE 10 MG/ML IJ SOLN
6.0000 mg | Freq: Every day | INTRAMUSCULAR | Status: DC
  Administered 2019-09-04 – 2019-09-06 (×3): 6 mg via INTRAVENOUS
  Filled 2019-09-03 (×2): qty 1

## 2019-09-03 NOTE — ED Triage Notes (Signed)
Pt reports generalized weakness. Pt also reporting N/V. Pt dx with COVID 10 days ago.

## 2019-09-03 NOTE — ED Notes (Signed)
Date and time results received: 09/03/19 0701 (use smartphrase ".now" to insert current time)  Test: INR Critical Value: >10  Name of Provider Notified: Sabra Heck  Orders Received? Or Actions Taken?: Orders Received - See Orders for details

## 2019-09-03 NOTE — Telephone Encounter (Signed)
Please see message. °

## 2019-09-03 NOTE — ED Provider Notes (Signed)
[facility] EMERGENCY DEPARTMENT Provider Note   CSN: [id number] Arrival date & time: 09/03/19  0415     History   Chief Complaint Chief Complaint  Patient presents with  . Weakness    HPI Catherine [name] is a 84 y.o. female.     HPI  This patient is an 84 year old female who presents approximately 27 hours after her last visit to the emergency department.  Her last 3 weeks have been very difficult after being diagnosed with coronavirus infection on 25 November.  Her symptoms had started approximately 10 days prior with a viral upper respiratory sounding infection however they progressed to coughing fevers chills and myalgias and has now evolved to more gastrointestinal symptoms with nausea vomiting and diarrhea.  She is progressively fatigued, unable to keep down any nausea medications food or fluids.  She has tried to drink ginger ale with minimal relief.  The patient reports that she was seen in the last 24 hours at an emergency department, I have reviewed the medical record and found that the patient was in fact seen, her labs were rather unremarkable, she did have mild hypokalemia, she had a CT scan which showed enteritis but no other acute findings.  I have confirmed that she did in fact test positive for coronavirus on 25 November.  She reports that neither Zofran or Phenergan are controlling her symptoms, she continues to feel weak, is progressively worsening with nausea and is now having watery diarrhea as well.  Her husband who had symptoms prior to her has improved back to his baseline and is feeling great  Past Medical History:  Diagnosis Date  . Aortic stenosis, mild 07/26/2017  . Arthritis   . Asthma    last attack 02/2015  . Atrial fibrillation, chronic ([place])   . Cellulitis of left lower extremity 08/17/2016   Ulcer associated with severe venous insufficiency  . Chronic anticoagulation   . Chronic diastolic CHF (congestive heart failure) ([place])   . Chronic kidney  disease    "RIGHT MANY KIDNEY INFECTIONS AND STONES"  . COPD (chronic obstructive pulmonary disease) ([place])   . Coronary artery disease   . Dizziness   . H/O: rheumatic fever   . Heart murmur   . Hypertension   . PONV (postoperative nausea and vomiting)    ' SOMETIMES', BUT NOT ALWAYS"  . S/P Maze operation for atrial fibrillation 10/14/2016   Complete bilateral atrial lesion set using cryothermy and bipolar radiofrequency ablation - atrial appendage was not treated due to previous surgical procedure (open mitral commissurotomy)  . S/P mitral valve replacement with bioprosthetic valve 10/14/2016   29 mm Medtronic Mosaic porcine bioprosthetic tissue valve  . UTI (urinary tract infection)   . Valvular heart disease    Has mitral stenosis with prior mitral commissurotomy in 1970    Patient Active Problem List   Diagnosis Date Noted  . COVID-19 08/27/2019  . Dysuria 07/07/2019  . Chronic anxiety 12/23/2017  . Aortic stenosis, mild 07/26/2017  . Dyslipidemia 12/20/2016  . S/P mitral valve replacement with bioprosthetic valve + CABG x1 + maze procedure 10/14/2016  . S/P Maze operation for atrial fibrillation 10/14/2016  . S/P CABG x 1 10/14/2016  . Cellulitis of left lower extremity 08/17/2016  . Chronic diastolic CHF (congestive heart failure) ([place])   . Coronary artery disease   . Chronic insomnia 07/12/2016  . Dyspnea 03/26/2015  . Atrial fibrillation, chronic ([place]) 03/16/2015  . Warfarin-induced coagulopathy ([place]) 03/16/2015  . Valvular heart disease  03/16/2015  . COPD GOLD II  03/16/2015  . Nephrolithiasis 03/16/2015  . Essential hypertension   . RHEUMATIC MITRAL STENOSIS 01/05/2008  . Rheumatic heart disease 01/05/2008  . Severe mitral insufficiency 01/05/2008  . Atrial fibrillation ([place]) 01/05/2008    Past Surgical History:  Procedure Laterality Date  . ABDOMINAL HYSTERECTOMY  1983   endometriosis  . APPENDECTOMY    . BACK SURGERY     neurosurgery x2  . CARDIAC  CATHETERIZATION    . CARDIAC CATHETERIZATION N/A 08/03/2016   Procedure: Right/Left Heart Cath and Coronary Angiography;  Surgeon: Peter M Martinique, MD;  Location: [facility];  Service: Cardiovascular;  Laterality: N/A;  . cataract surg    . CHOLECYSTECTOMY N/A 05/30/2015   Procedure: LAPAROSCOPIC CHOLECYSTECTOMY WITH INTRAOPERATIVE CHOLANGIOGRAM;  Surgeon: Excell Seltzer, MD;  Location: [place];  Service: General;  Laterality: N/A;  . COLONOSCOPY    . CORONARY ARTERY BYPASS GRAFT N/A 10/14/2016   Procedure: CORONARY ARTERY BYPASS GRAFTING (CABG);  Surgeon: Rexene Alberts, MD;  Location: [place];  Service: Open Heart Surgery;  Laterality: N/A;  . EYE SURGERY    . MAZE N/A 10/14/2016   Procedure: MAZE;  Surgeon: Rexene Alberts, MD;  Location: [place];  Service: Open Heart Surgery;  Laterality: N/A;  . MITRAL VALVE REPLACEMENT N/A 10/14/2016   Procedure: REDO MITRAL VALVE REPLACEMENT (MVR);  Surgeon: Rexene Alberts, MD;  Location: [place];  Service: Open Heart Surgery;  Laterality: N/A;  . MITRAL VALVE SURGERY Left 1970   Open mitral commissurotomy via left thoracotomy approach  . TEE WITHOUT CARDIOVERSION N/A 07/05/2016   Procedure: TRANSESOPHAGEAL ECHOCARDIOGRAM (TEE);  Surgeon: [name], MD;  Location: [place];  Service: Cardiovascular;  Laterality: N/A;  . TEE WITHOUT CARDIOVERSION N/A 10/14/2016   Procedure: TRANSESOPHAGEAL ECHOCARDIOGRAM (TEE);  Surgeon: Rexene Alberts, MD;  Location: [place];  Service: Open Heart Surgery;  Laterality: N/A;     OB History   No obstetric history on file.      Home Medications    Prior to Admission medications   Medication Sig Start Date End Date Taking? Authorizing Provider  acetaminophen (TYLENOL) 500 MG tablet Take 500 mg by mouth every 6 (six) hours as needed for mild pain.    [provider]  albuterol (PROVENTIL HFA;VENTOLIN HFA) 108 (90 Base) MCG/ACT inhaler Inhale 2 puffs into the lungs every 6 (six) hours as needed for  wheezing or shortness of breath. 11/06/18   Burchette, Alinda Sierras, MD  aspirin EC 81 MG EC tablet Take 1 tablet (81 mg total) by mouth daily. 10/22/16   Gold, Wayne E, PA-C  benzonatate (TESSALON) 100 MG capsule Take 1 capsule (100 mg total) by mouth 3 (three) times daily as needed for cough. 08/15/19   Burchette, Alinda Sierras, MD  budesonide-formoterol (SYMBICORT) 80-4.5 MCG/ACT inhaler 2 PUFFS 2 TIMES A DAY 07/16/19   Tanda Rockers, MD  cephALEXin (KEFLEX) 500 MG capsule Take 1 capsule (500 mg total) by mouth 4 (four) times daily. 08/17/19   Burchette, Alinda Sierras, MD  diazepam (VALIUM) 5 MG tablet TAKE ONE TABLET AT BEDTIME 05/30/19   Burchette, Alinda Sierras, MD  doxazosin (CARDURA) 4 MG tablet TAKE 1 TABLET DAILY. STOP LOSARTAN 07/30/19   [name], MD  fluconazole (DIFLUCAN) 150 MG tablet Take 1 tablet by mouth once for 1 dose. 08/17/19   Burchette, Alinda Sierras, MD  furosemide (LASIX) 40 MG tablet TAKE 1 TABLET DAILY. MAY TAKE 1 EXTRA AS NEEDED FOR  SWELLING 07/18/19   [name], MD  ondansetron (ZOFRAN ODT) 4 MG disintegrating tablet Take 1 tablet (4 mg total) by mouth every 8 (eight) hours as needed for nausea or vomiting. 08/17/19   Burchette, Alinda Sierras, MD  OVER THE COUNTER MEDICATION Apply 1 application topically at bedtime as needed (Leg Pain). Magni-Life topical cream for leg pain.    [provider]  potassium chloride (KLOR-CON) 10 MEQ tablet If taking 1 lasix tab- Take 2 tablets by mouth twice a day.  If 2 tabs of lasix, then take 5 tabs of potassium 07/07/19   Tonia Ghent, MD  promethazine (PHENERGAN) 25 MG tablet Take 0.5 tablets (12.5 mg total) by mouth every 6 (six) hours as needed for nausea or vomiting. 09/02/19   Merryl Hacker, MD  rosuvastatin (CRESTOR) 5 MG tablet Take 2.5mg  every Monday and Friday for 2 weeks, then increase to 5mg  every Monday and Friday if able to tolerate. 08/06/19   [name], MD  triamterene-hydrochlorothiazide (MAXZIDE-25) 37.5-25 MG tablet  Take 1 tablet by mouth daily. 11/15/18   [name], MD  warfarin (COUMADIN) 2 MG tablet Take 1 tablet daily except 2 on Mondays or [place]  90 DAY 07/30/19   Burchette, Alinda Sierras, MD    Family History Family History  Problem Relation Age of Onset  . Leukemia Father   . Breast cancer Neg Hx     Social History Social History   Tobacco Use  . Smoking status: Former Smoker    Packs/day: 1.00    Years: 15.00    Pack years: 15.00    Types: Cigarettes    Quit date: 09/28/1975    Years since quitting: 43.9  . Smokeless tobacco: Never Used  Substance Use Topics  . Alcohol use: No    Alcohol/week: 0.0 standard drinks  . Drug use: No     Allergies   Aldactone [spironolactone], Amoxicillin, Diltiazem, Flagyl [metronidazole hcl], Flovent [fluticasone propionate], Gabapentin, Lyrica [pregabalin], Quinidine, Simvastatin, Tramadol, Verapamil, Amlodipine, Zetia [ezetimibe], Ace inhibitors, Benazepril hcl, Ciprocin-fluocin-procin [fluocinolone acetonide], Ciprofloxacin, Codeine, and Nitrofurantoin monohyd macro   Review of Systems Review of Systems  All other systems reviewed and are negative.    Physical Exam Updated Vital Signs There were no vitals taken for this visit.  Physical Exam Vitals signs and nursing note reviewed.  Constitutional:      General: She is not in acute distress.    Appearance: She is well-developed.  HENT:     Head: Normocephalic and atraumatic.     Mouth/Throat:     Mouth: Mucous membranes are dry.     Pharynx: No oropharyngeal exudate.  Eyes:     General: No scleral icterus.       Right eye: No discharge.        Left eye: No discharge.     Conjunctiva/sclera: Conjunctivae normal.     Pupils: Pupils are equal, round, and reactive to light.  Neck:     Musculoskeletal: Normal range of motion and neck supple.     Thyroid: No thyromegaly.     Vascular: No JVD.  Cardiovascular:     Rate and Rhythm: Normal rate and  regular rhythm.     Heart sounds: Normal heart sounds. No murmur. No friction rub. No gallop.   Pulmonary:     Effort: Pulmonary effort is normal. No respiratory distress.     Breath sounds: Normal breath sounds. No wheezing or rales.  Abdominal:  General: Bowel sounds are normal. There is no distension.     Palpations: Abdomen is soft. There is no mass.     Tenderness: There is abdominal tenderness ( Left lower, midline and right lower tenderness with with mild guarding but no peritoneal signs, no upper abdominal tenderness).  Musculoskeletal: Normal range of motion.        General: No tenderness.  Lymphadenopathy:     Cervical: No cervical adenopathy.  Skin:    General: Skin is warm and dry.     Findings: No erythema or rash.  Neurological:     Mental Status: She is alert.     Coordination: Coordination normal.  Psychiatric:        Behavior: Behavior normal.      ED Treatments / Results  Labs (all labs ordered are listed, but only abnormal results are displayed) Labs Reviewed - No data to display  EKG None  Radiology Ct Abdomen Pelvis W Contrast  Result Date: 09/02/2019 CLINICAL DATA:  Initial evaluation for acute nausea, vomiting. EXAM: CT ABDOMEN AND PELVIS WITH CONTRAST TECHNIQUE: Multidetector CT imaging of the abdomen and pelvis was performed using the standard protocol following bolus administration of intravenous contrast. CONTRAST:  57mL OMNIPAQUE IOHEXOL 300 MG/ML  SOLN COMPARISON:  None available. FINDINGS: Lower chest: Scattered subpleural reticular and ground-glass opacities seen within the bilateral lung bases, suspected reflect fibrosis and/or scarring. Visualized lungs are otherwise clear. Prominent coronary artery calcifications. Prosthetic mitral valve noted. No pleural pericardial effusion. Hepatobiliary: Liver demonstrates a normal contrast enhanced appearance. Focal fat deposition noted a sent to the falciform ligament. Gallbladder surgically absent. No  biliary dilatation. Pancreas: Pancreas within normal limits. Spleen: Spleen within normal limits. Adrenals/Urinary Tract: Adrenal glands are normal. Kidneys equal in size with symmetric enhancement. Few scattered subcentimeter hypodensities noted, too small the characterize, but statistically likely reflects small cyst. No focal enhancing renal mass. Punctate nonobstructive left renal nephrolithiasis. No hydronephrosis. No hydroureter or visible ureterolithiasis. Bladder largely decompressed without acute abnormality. Stomach/Bowel: Stomach largely decompressed without acute abnormality. Few scattered loops of prominent jejunum seen clustered within the mid abdomen demonstrate prominent wall thickening and mucosal hyperemia, suggesting acute enteritis. Adjacent hazy inflammatory fat stranding and small volume free fluid. No evidence for associated obstruction, perforation, or other complication. No pneumatosis or portal venous gas. Small bowel decompressed distally. Appendix not definitely visualized.: Of normal caliber without acute inflammatory changes. Moderate retained stool within the distal colon, which could reflect constipation. Vascular/Lymphatic: Moderate aorto bi-iliac atherosclerotic disease. No aneurysm. Mesenteric vessels patent proximally. No adenopathy. Reproductive: Uterus is absent. Ovaries not confidently identified. No adnexal mass. Other: Small volume free fluid adjacent to the spleen and along the left pericolic gutter as well as within the pelvis, likely reactive. No free intraperitoneal air. Small fat containing paraumbilical hernia noted without associated inflammation. Musculoskeletal: External soft tissues demonstrate no acute finding. No acute osseous abnormality. No discrete lytic or blastic osseous lesions. Prior PLIF at L5-S1 noted. IMPRESSION: 1. Prominent wall thickening with mucosal enhancement involving a few scattered loops of jejunum within the mid abdomen, suggesting acute  enteritis. No evidence for associated obstruction, perforation, or other complication. 2. Small volume free fluid within the abdomen and pelvis, likely reactive. 3. Moderate retained stool within the distal colon, which could reflect constipation. 4. Punctate nonobstructive left renal nephrolithiasis. 5. Moderate aorto bi-iliac atherosclerotic disease. Electronically Signed   By: Jeannine Boga M.D.   On: 09/02/2019 04:28    Procedures Procedures (including critical care time)  Medications  Ordered in ED Medications - No data to display   Initial Impression / Assessment and Plan / ED Course  I have reviewed the triage vital signs and the nursing notes.  Pertinent labs & imaging results that were available during my care of the patient were reviewed by me and considered in my medical decision making (see chart for details).        Labs and CT scan reviewed from yesterday.  The patient will undergo repeat labs, IV fluid hydration, she may need to be admitted to the hospital given her persistent failure to thrive in relation to this illness.  She does not appear to be in severe distress, she does not have respiratory distress or hypoxia.  I reviewed the paramedics story and their vital signs and prehospital work-up.  I agree with this work-up as does the patient  The patient is getting IV fluids, she unfortunately has a high specific gravity with protein in her urine indicative of dehydration however metabolic panel is reassuring, her creatinine is 1.13 which is up from her baseline of 0.86 yesterday and her BUN has gone from 16-26.  She is slightly hyperglycemic at 154, albumin of 3.4 and otherwise electrolytes are normal.  The patient has minimal improvement with Zofran, she will get metoclopramide.  I had a discussion with the patient about how she feels and states that since she has been so sick for the last 24 hours with persistent nausea and vomiting that she would likely benefit from  the hospital overnight to get ongoing IV fluids.  That being said she has no fever, no tachycardia and other than being diffusely generally weak has no focal deficits.  Hospitalist paged for admission.  D/w Dr. Manuella Ghazi - will admit to high level of care. Vit K given for severe coagulopathy -   Final Clinical Impressions(s) / ED Diagnoses   Final diagnoses:  Gastroenteritis  COVID-19  Dehydration  Coagulopathy ([place])      Noemi Chapel, MD 09/03/19 [phone]

## 2019-09-03 NOTE — Progress Notes (Signed)
Pharmacy Antibiotic Note  Catherine [name] is a 84 y.o. female admitted on 09/03/2019 with intra-abdominal infection.  Pharmacy has been consulted for Zosyn dosing.  Plan: Zosyn 3.375g IV q8h (4 hour infusion).  Monitor labs, c/s, and patient improvement.     Temp (24hrs), Avg:99.3 F (37.4 C), Min:98.3 F (36.8 C), Max:100.2 F (37.9 C)  Recent Labs  Lab 09/02/19 0156 09/03/19 0440 09/03/19 0645  WBC 17.8* 19.5*  --   CREATININE 0.86 1.13*  --   LATICACIDVEN  --   --  1.7    Estimated Creatinine Clearance: 33.1 mL/min (A) (by C-G formula based on SCr of 1.13 mg/dL (H)).    Allergies  Allergen Reactions  . Aldactone [Spironolactone] Other (See Comments)    dyspnea  . Amoxicillin Palpitations    Tachycardia Has patient had a PCN reaction causing immediate rash, facial/tongue/throat swelling, SOB or lightheadedness with hypotension: no Has patient had a PCN reaction causing severe rash involving mucus membranes or skin necrosis: {no Has patient had a PCN reaction that required hospitalization {no Has patient had a PCN reaction occurring within the last 10 years: {yes If all of the above answers are "NO", then may proceed with Cephalosporin use.  . Diltiazem Other (See Comments)    Causing headaches   . Flagyl [Metronidazole Hcl] Other (See Comments)    Causing headaches    . Flovent [Fluticasone Propionate] Other (See Comments)    Leg cramps  . Gabapentin Swelling  . Lyrica [Pregabalin] Swelling  . Quinidine Diarrhea and Other (See Comments)    Fever diarrhea  . Simvastatin Other (See Comments)    Leg pain, myalgia  . Tramadol Nausea Only  . Verapamil Other (See Comments)    myalgias  . Amlodipine     Low extremity edema  . Zetia [Ezetimibe]     LEG CRAMPS   . Ace Inhibitors Other (See Comments)    unknown  . Benazepril Hcl Cough  . Ciprocin-Fluocin-Procin [Fluocinolone Acetonide] Other (See Comments)    unknown  . Ciprofloxacin Diarrhea  . Codeine Nausea  Only  . Nitrofurantoin Monohyd Macro Nausea Only    Antimicrobials this admission: Zosyn 12/7 >>     Dose adjustments this admission: N/A  Microbiology results: 12/7 BCx: ngtd 12/7 UCx: pending    Thank you for allowing pharmacy to be a part of this patient's care.  [name] 09/03/2019 1:32 PM

## 2019-09-03 NOTE — Telephone Encounter (Signed)
Copied from [place] [id number]. Topic: General - Other >> Sep 03, 2019 10:50 AM Oneta Rack wrote: Reason for CRM: spouse wanted to inform PCP patient is currently in hospital and was dx with COVID

## 2019-09-03 NOTE — Progress Notes (Signed)
Unable to hang LR at this time because Zosyn is currently running and compatibility is variable.  Will attempt another IV when medication pass has been completed.

## 2019-09-03 NOTE — Telephone Encounter (Signed)
Aware.  Thanks

## 2019-09-03 NOTE — H&P (Addendum)
History and Physical    Catherine [name] [id number] DOB: 03-24-1935 DOA: 09/03/2019  PCP: [name], MD   Patient coming from: Home  Chief Complaint: Weakness with N/V/D  HPI: Catherine [name] is a 84 y.o. female with medical history significant for atrial fibrillation on Coumadin, hypertension, prior mitral valve replacement, aortic stenosis, carotid stenosis, prior CABG, and heart failure who was also recently noted to be Covid positive on 11/25.  She was seen in the ED at [facility] yesterday on account of some nausea along with vomiting and diarrhea and had a CT of the abdomen demonstrating enteritis but no other acute findings.  She was noted to have leukocytosis and was discharged from the ED with some Phenergan.  She returned to the ED today with ongoing symptoms with progressive weakness.  She was initially noted to have some coughing with fevers and chills as well as myalgias, but denies any of the symptoms currently, nor does she have any shortness of breath or chest pain.  She has not been able to keep down any food or fluids and continues to have ongoing GI symptoms, prompting the return to the ED.   ED Course: Vital signs are stable with some soft blood pressure readings, but no fever noted.  She is noted to have leukocytosis of 19,500 which is elevated from 17,800 yesterday.  Her creatinine has elevated to 1.13 as well from baseline of 0.8 and her INR is noted to be greater than 10.  No overt bleeding is identified.  EKG with atrial fibrillation at 75 bpm.  She has been given some IV fluid and started on Zosyn and has also been given some vitamin K.  Covid testing has not been repeated as she is noted to be positive.  She is requiring 2 L nasal cannula oxygen, but is not in any respiratory distress.  Chest x-ray ordered later with no acute findings noted.  Review of Systems: All others reviewed as noted above and otherwise negative.  Past Medical History:  Diagnosis Date    Aortic stenosis, mild 07/26/2017   Arthritis    Asthma    last attack 02/2015   Atrial fibrillation, chronic (HCC)    Cellulitis of left lower extremity 08/17/2016   Ulcer associated with severe venous insufficiency   Chronic anticoagulation    Chronic diastolic CHF (congestive heart failure) ([place])    Chronic kidney disease    "RIGHT MANY KIDNEY INFECTIONS AND STONES"   COPD (chronic obstructive pulmonary disease) (HCC)    Coronary artery disease    Dizziness    H/O: rheumatic fever    Heart murmur    Hypertension    PONV (postoperative nausea and vomiting)    ' SOMETIMES', BUT NOT ALWAYS"   S/P Maze operation for atrial fibrillation 10/14/2016   Complete bilateral atrial lesion set using cryothermy and bipolar radiofrequency ablation - atrial appendage was not treated due to previous surgical procedure (open mitral commissurotomy)   S/P mitral valve replacement with bioprosthetic valve 10/14/2016   29 mm Medtronic Mosaic porcine bioprosthetic tissue valve   UTI (urinary tract infection)    Valvular heart disease    Has mitral stenosis with prior mitral commissurotomy in 1970    Past Surgical History:  Procedure Laterality Date   ABDOMINAL HYSTERECTOMY  1983   endometriosis   APPENDECTOMY     BACK SURGERY     neurosurgery x2   CARDIAC CATHETERIZATION     CARDIAC CATHETERIZATION N/A 08/03/2016  Procedure: Right/Left Heart Cath and Coronary Angiography;  Surgeon: Peter M Martinique, MD;  Location: [place];  Service: Cardiovascular;  Laterality: N/A;   cataract surg     CHOLECYSTECTOMY N/A 05/30/2015   Procedure: LAPAROSCOPIC CHOLECYSTECTOMY WITH INTRAOPERATIVE CHOLANGIOGRAM;  Surgeon: Excell Seltzer, MD;  Location: [place];  Service: General;  Laterality: N/A;   COLONOSCOPY     CORONARY ARTERY BYPASS GRAFT N/A 10/14/2016   Procedure: CORONARY ARTERY BYPASS GRAFTING (CABG);  Surgeon: Rexene Alberts, MD;  Location: [place];  Service: Open Heart  Surgery;  Laterality: N/A;   EYE SURGERY     MAZE N/A 10/14/2016   Procedure: MAZE;  Surgeon: Rexene Alberts, MD;  Location: [place];  Service: Open Heart Surgery;  Laterality: N/A;   MITRAL VALVE REPLACEMENT N/A 10/14/2016   Procedure: REDO MITRAL VALVE REPLACEMENT (MVR);  Surgeon: Rexene Alberts, MD;  Location: [place];  Service: Open Heart Surgery;  Laterality: N/A;   MITRAL VALVE SURGERY Left 1970   Open mitral commissurotomy via left thoracotomy approach   TEE WITHOUT CARDIOVERSION N/A 07/05/2016   Procedure: TRANSESOPHAGEAL ECHOCARDIOGRAM (TEE);  Surgeon: [name], MD;  Location: [facility];  Service: Cardiovascular;  Laterality: N/A;   TEE WITHOUT CARDIOVERSION N/A 10/14/2016   Procedure: TRANSESOPHAGEAL ECHOCARDIOGRAM (TEE);  Surgeon: Rexene Alberts, MD;  Location: [place];  Service: Open Heart Surgery;  Laterality: N/A;     reports that she quit smoking about 43 years ago. Her smoking use included cigarettes. She has a 15.00 pack-year smoking history. She has never used smokeless tobacco. She reports that she does not drink alcohol or use drugs.  Allergies  Allergen Reactions   Aldactone [Spironolactone] Other (See Comments)    dyspnea   Amoxicillin Palpitations    Tachycardia Has patient had a PCN reaction causing immediate rash, facial/tongue/throat swelling, SOB or lightheadedness with hypotension: no Has patient had a PCN reaction causing severe rash involving mucus membranes or skin necrosis: {no Has patient had a PCN reaction that required hospitalization {no Has patient had a PCN reaction occurring within the last 10 years: {yes If all of the above answers are "NO", then may proceed with Cephalosporin use.   Diltiazem Other (See Comments)    Causing headaches    Flagyl [Metronidazole Hcl] Other (See Comments)    Causing headaches     Flovent [Fluticasone Propionate] Other (See Comments)    Leg cramps   Gabapentin Swelling   Lyrica [Pregabalin]  Swelling   Quinidine Diarrhea and Other (See Comments)    Fever diarrhea   Simvastatin Other (See Comments)    Leg pain, myalgia   Tramadol Nausea Only   Verapamil Other (See Comments)    myalgias   Amlodipine     Low extremity edema   Zetia [Ezetimibe]     LEG CRAMPS    Ace Inhibitors Other (See Comments)    unknown   Benazepril Hcl Cough   Ciprocin-Fluocin-Procin [Fluocinolone Acetonide] Other (See Comments)    unknown   Ciprofloxacin Diarrhea   Codeine Nausea Only   Nitrofurantoin Monohyd Macro Nausea Only    Family History  Problem Relation Age of Onset   Leukemia Father    Breast cancer Neg Hx     Prior to Admission medications   Medication Sig Start Date End Date Taking? Authorizing Provider  acetaminophen (TYLENOL) 500 MG tablet Take 500 mg by mouth every 6 (six) hours as needed for mild pain.    [provider]  albuterol (  PROVENTIL HFA;VENTOLIN HFA) 108 (90 Base) MCG/ACT inhaler Inhale 2 puffs into the lungs every 6 (six) hours as needed for wheezing or shortness of breath. 11/06/18   Burchette, Alinda Sierras, MD  aspirin EC 81 MG EC tablet Take 1 tablet (81 mg total) by mouth daily. 10/22/16   Gold, Wayne E, PA-C  benzonatate (TESSALON) 100 MG capsule Take 1 capsule (100 mg total) by mouth 3 (three) times daily as needed for cough. 08/15/19   Burchette, Alinda Sierras, MD  budesonide-formoterol (SYMBICORT) 80-4.5 MCG/ACT inhaler 2 PUFFS 2 TIMES A DAY 07/16/19   Tanda Rockers, MD  cephALEXin (KEFLEX) 500 MG capsule Take 1 capsule (500 mg total) by mouth 4 (four) times daily. 08/17/19   Burchette, Alinda Sierras, MD  diazepam (VALIUM) 5 MG tablet TAKE ONE TABLET AT BEDTIME 05/30/19   Burchette, Alinda Sierras, MD  doxazosin (CARDURA) 4 MG tablet TAKE 1 TABLET DAILY. STOP LOSARTAN 07/30/19   [name], MD  fluconazole (DIFLUCAN) 150 MG tablet Take 1 tablet by mouth once for 1 dose. 08/17/19   Burchette, Alinda Sierras, MD  furosemide (LASIX) 40 MG tablet TAKE 1 TABLET DAILY.  MAY TAKE 1 EXTRA AS NEEDED FOR SWELLING 07/18/19   [name], MD  ondansetron (ZOFRAN ODT) 4 MG disintegrating tablet Take 1 tablet (4 mg total) by mouth every 8 (eight) hours as needed for nausea or vomiting. 08/17/19   Burchette, Alinda Sierras, MD  OVER THE COUNTER MEDICATION Apply 1 application topically at bedtime as needed (Leg Pain). Magni-Life topical cream for leg pain.    [provider]  potassium chloride (KLOR-CON) 10 MEQ tablet If taking 1 lasix tab- Take 2 tablets by mouth twice a day.  If 2 tabs of lasix, then take 5 tabs of potassium 07/07/19   Tonia Ghent, MD  promethazine (PHENERGAN) 25 MG tablet Take 0.5 tablets (12.5 mg total) by mouth every 6 (six) hours as needed for nausea or vomiting. 09/02/19   Merryl Hacker, MD  rosuvastatin (CRESTOR) 5 MG tablet Take 2.5mg  every Monday and Friday for 2 weeks, then increase to 5mg  every Monday and Friday if able to tolerate. 08/06/19   [name], MD  triamterene-hydrochlorothiazide (MAXZIDE-25) 37.5-25 MG tablet Take 1 tablet by mouth daily. 11/15/18   [name], MD  warfarin (COUMADIN) 2 MG tablet Take 1 tablet daily except 2 on Mondays or AS DIRECTED BY ANTICOAGULATION CLINIC  90 DAY 07/30/19   [name], MD    Physical Exam: Vitals:   09/03/19 0630 09/03/19 0700 09/03/19 0730 09/03/19 1100  BP: (!) 100/56 124/65 (!) 123/59 124/72  Pulse: 77 69 77 69  Resp: 20 17 15 16   Temp:    100.2 F (37.9 C)  TempSrc:    Oral  SpO2: 100% 100% 100% 98%    Constitutional: NAD, calm, comfortable Vitals:   09/03/19 0630 09/03/19 0700 09/03/19 0730 09/03/19 1100  BP: (!) 100/56 124/65 (!) 123/59 124/72  Pulse: 77 69 77 69  Resp: 20 17 15 16   Temp:    100.2 F (37.9 C)  TempSrc:    Oral  SpO2: 100% 100% 100% 98%   Eyes: lids and conjunctivae normal ENMT: Mucous membranes are moist.  Neck: normal, supple Respiratory: clear to auscultation bilaterally. Normal respiratory effort. No accessory muscle  use.  Currently on 2 L nasal cannula oxygen. Cardiovascular: Regular rate and rhythm, no murmurs. No extremity edema. Abdomen: no tenderness, no distention. Bowel sounds positive.  Musculoskeletal:  No joint deformity  upper and lower extremities.   Skin: no rashes, lesions, ulcers.  Psychiatric: Normal judgment and insight. Alert and oriented x 3. Normal mood.   Labs on Admission: I have personally reviewed following labs and imaging studies  CBC: Recent Labs  Lab 09/02/19 0156 09/03/19 0440  WBC 17.8* 19.5*  NEUTROABS 15.5* 16.5*  HGB 13.2 11.5*  HCT 39.0 34.4*  MCV 87.8 91.0  PLT 300 [id number]   Basic Metabolic Panel: Recent Labs  Lab 09/02/19 0156 09/03/19 0440  NA 138 141  K 3.1* 3.8  CL 102 101  CO2 25 26  GLUCOSE 156* 154*  BUN 16 26*  CREATININE 0.86 1.13*  CALCIUM 8.8* 9.1   GFR: Estimated Creatinine Clearance: 33.1 mL/min (A) (by C-G formula based on SCr of 1.13 mg/dL (H)). Liver Function Tests: Recent Labs  Lab 09/02/19 0156 09/03/19 0440  AST 30 23  ALT 26 20  ALKPHOS 88 82  BILITOT 0.8 0.9  PROT 7.0 6.9  ALBUMIN 3.7 3.4*   Recent Labs  Lab 09/02/19 0156 09/03/19 0440  LIPASE 30 22   No results for input(s): AMMONIA in the last 168 hours. Coagulation Profile: Recent Labs  Lab 09/03/19 0440  INR >10.0*   Cardiac Enzymes: No results for input(s): CKTOTAL, CKMB, CKMBINDEX, TROPONINI in the last 168 hours. BNP (last 3 results) No results for input(s): PROBNP in the last 8760 hours. HbA1C: No results for input(s): HGBA1C in the last 72 hours. CBG: No results for input(s): GLUCAP in the last 168 hours. Lipid Profile: No results for input(s): CHOL, HDL, LDLCALC, TRIG, CHOLHDL, LDLDIRECT in the last 72 hours. Thyroid Function Tests: No results for input(s): TSH, T4TOTAL, FREET4, T3FREE, THYROIDAB in the last 72 hours. Anemia Panel: No results for input(s): VITAMINB12, FOLATE, FERRITIN, TIBC, IRON, RETICCTPCT in the last 72 hours. Urine  analysis:    Component Value Date/Time   COLORURINE AMBER (A) 09/03/2019 0421   APPEARANCEUR HAZY (A) 09/03/2019 0421   LABSPEC 1.035 (H) 09/03/2019 0421   PHURINE 5.0 09/03/2019 0421   GLUCOSEU NEGATIVE 09/03/2019 0421   HGBUR NEGATIVE 09/03/2019 0421   BILIRUBINUR NEGATIVE 09/03/2019 0421   BILIRUBINUR negative 07/09/2019 1451   KETONESUR NEGATIVE 09/03/2019 0421   PROTEINUR 30 (A) 09/03/2019 0421   UROBILINOGEN 0.2 07/09/2019 1451   UROBILINOGEN 0.2 03/15/2015 2151   NITRITE NEGATIVE 09/03/2019 0421   LEUKOCYTESUR NEGATIVE 09/03/2019 0421    Radiological Exams on Admission: Ct Abdomen Pelvis W Contrast  Result Date: 09/02/2019 CLINICAL DATA:  Initial evaluation for acute nausea, vomiting. EXAM: CT ABDOMEN AND PELVIS WITH CONTRAST TECHNIQUE: Multidetector CT imaging of the abdomen and pelvis was performed using the standard protocol following bolus administration of intravenous contrast. CONTRAST:  67mL OMNIPAQUE IOHEXOL 300 MG/ML  SOLN COMPARISON:  None available. FINDINGS: Lower chest: Scattered subpleural reticular and ground-glass opacities seen within the bilateral lung bases, suspected reflect fibrosis and/or scarring. Visualized lungs are otherwise clear. Prominent coronary artery calcifications. Prosthetic mitral valve noted. No pleural pericardial effusion. Hepatobiliary: Liver demonstrates a normal contrast enhanced appearance. Focal fat deposition noted a sent to the falciform ligament. Gallbladder surgically absent. No biliary dilatation. Pancreas: Pancreas within normal limits. Spleen: Spleen within normal limits. Adrenals/Urinary Tract: Adrenal glands are normal. Kidneys equal in size with symmetric enhancement. Few scattered subcentimeter hypodensities noted, too small the characterize, but statistically likely reflects small cyst. No focal enhancing renal mass. Punctate nonobstructive left renal nephrolithiasis. No hydronephrosis. No hydroureter or visible ureterolithiasis.  Bladder largely decompressed without acute abnormality. Stomach/Bowel: Stomach  largely decompressed without acute abnormality. Few scattered loops of prominent jejunum seen clustered within the mid abdomen demonstrate prominent wall thickening and mucosal hyperemia, suggesting acute enteritis. Adjacent hazy inflammatory fat stranding and small volume free fluid. No evidence for associated obstruction, perforation, or other complication. No pneumatosis or portal venous gas. Small bowel decompressed distally. Appendix not definitely visualized.: Of normal caliber without acute inflammatory changes. Moderate retained stool within the distal colon, which could reflect constipation. Vascular/Lymphatic: Moderate aorto bi-iliac atherosclerotic disease. No aneurysm. Mesenteric vessels patent proximally. No adenopathy. Reproductive: Uterus is absent. Ovaries not confidently identified. No adnexal mass. Other: Small volume free fluid adjacent to the spleen and along the left pericolic gutter as well as within the pelvis, likely reactive. No free intraperitoneal air. Small fat containing paraumbilical hernia noted without associated inflammation. Musculoskeletal: External soft tissues demonstrate no acute finding. No acute osseous abnormality. No discrete lytic or blastic osseous lesions. Prior PLIF at L5-S1 noted. IMPRESSION: 1. Prominent wall thickening with mucosal enhancement involving a few scattered loops of jejunum within the mid abdomen, suggesting acute enteritis. No evidence for associated obstruction, perforation, or other complication. 2. Small volume free fluid within the abdomen and pelvis, likely reactive. 3. Moderate retained stool within the distal colon, which could reflect constipation. 4. Punctate nonobstructive left renal nephrolithiasis. 5. Moderate aorto bi-iliac atherosclerotic disease. Electronically Signed   By: Jeannine Boga M.D.   On: 09/02/2019 04:28   Dg Chest Port 1 View  Result Date:  09/03/2019 CLINICAL DATA:  Shortness of breath in a patient who is COVID-19 positive. EXAM: PORTABLE CHEST 1 VIEW COMPARISON:  Single-view of the chest 08/29/2019. PA and lateral chest 10/30/2018. FINDINGS: Mild bibasilar atelectasis is present. No consolidative process, pneumothorax or effusion. Heart size is upper normal. The patient is status post CABG. Atherosclerosis noted. IMPRESSION: No acute disease. Atherosclerosis. Electronically Signed   By: Inge Rise M.D.   On: 09/03/2019 11:19    EKG: Independently reviewed.  Atrial fibrillation 75 bpm.  Assessment/Plan Active Problems:   Enteritis    Intractable nausea and vomiting along with diarrhea secondary to acute enteritis -Could be related to COVID-19 infection -Recent use of antibiotics with Keflex noted for UTI, but doubtful this is C. difficile related -We will check stool GI panel -Use Zofran as needed for nausea and vomiting -Clear liquid diet and advance as tolerated -Gentle, time-limited IV fluid -Maintain on IV Zosyn empirically with leukocytosis noted -Monitor repeat labs  Acute hypoxemic respiratory failure secondary to COVID-19 infection -We will administer dexamethasone today and continue 6 mg IV daily -Wean oxygen as tolerated -Breathing treatments as needed for shortness of breath or wheezing with inhalers -Remdesivir consultation placed -Chest x-ray with no acute findings -Monitor inflammatory markers daily  AKI-likely prerenal from above (baseline 0.8) -Continue on IV fluid, time-limited -Avoid nephrotoxic agents -Monitor intake and output -Follow-up a.m. labs  Supratherapeutic INR secondary to Coumadin use -Coumadin discontinued for now -No overt bleeding identified -Vitamin K given in ED -Monitor repeat INR in a.m.  Chronic atrial fibrillation -Hold Coumadin for now as patient is supratherapeutic -Monitor on telemetry  CAD/CABG -Continue aspirin and statin  Diastolic CHF -Currently stable,  follow daily weights -Last 2D echocardiogram 06/2018 with LVEF 65-70% -Hold Lasix for now and resume in a.m. if tolerating diet and renal function has improved  Hypertension-controlled -Continue Cardura -Hold triamterene/HCTZ -Labetalol as needed for significant elevations  DVT prophylaxis: SCDs Code Status: Full Family Communication: None at bedside Disposition Plan: Admit for treatment of enteritis  and COVID-19 with mild hypoxemia Consults called: None Admission status: Inpatient, telemetry   [facility] Pager [phone]  If 7PM-7AM, please contact night-coverage [email or web address] Password [email or web address]  09/03/2019, 12:16 PM

## 2019-09-04 DIAGNOSIS — D689 Coagulation defect, unspecified: Secondary | ICD-10-CM

## 2019-09-04 DIAGNOSIS — E860 Dehydration: Secondary | ICD-10-CM

## 2019-09-04 DIAGNOSIS — K529 Noninfective gastroenteritis and colitis, unspecified: Secondary | ICD-10-CM

## 2019-09-04 LAB — CBC
HCT: 27.9 % — ABNORMAL LOW (ref 36.0–46.0)
Hemoglobin: 9.2 g/dL — ABNORMAL LOW (ref 12.0–15.0)
MCH: 29.8 pg (ref 26.0–34.0)
MCHC: 33 g/dL (ref 30.0–36.0)
MCV: 90.3 fL (ref 80.0–100.0)
Platelets: 242 10*3/uL (ref 150–400)
RBC: 3.09 MIL/uL — ABNORMAL LOW (ref 3.87–5.11)
RDW: 13.3 % (ref 11.5–15.5)
WBC: 12.2 10*3/uL — ABNORMAL HIGH (ref 4.0–10.5)
nRBC: 0 % (ref 0.0–0.2)

## 2019-09-04 LAB — URINE CULTURE: Culture: NO GROWTH

## 2019-09-04 LAB — COMPREHENSIVE METABOLIC PANEL
ALT: 15 U/L (ref 0–44)
AST: 20 U/L (ref 15–41)
Albumin: 3 g/dL — ABNORMAL LOW (ref 3.5–5.0)
Alkaline Phosphatase: 69 U/L (ref 38–126)
Anion gap: 10 (ref 5–15)
BUN: 23 mg/dL (ref 8–23)
CO2: 27 mmol/L (ref 22–32)
Calcium: 8.8 mg/dL — ABNORMAL LOW (ref 8.9–10.3)
Chloride: 104 mmol/L (ref 98–111)
Creatinine, Ser: 0.9 mg/dL (ref 0.44–1.00)
GFR calc Af Amer: >60 mL/min (ref >60)
GFR calc non Af Amer: 60 mL/min — ABNORMAL LOW (ref >60)
Glucose, Bld: 130 mg/dL — ABNORMAL HIGH (ref 70–99)
Potassium: 3.3 mmol/L — ABNORMAL LOW (ref 3.5–5.1)
Sodium: 141 mmol/L (ref 135–145)
Total Bilirubin: 0.8 mg/dL (ref 0.3–1.2)
Total Protein: 6.1 g/dL — ABNORMAL LOW (ref 6.5–8.1)

## 2019-09-04 LAB — FERRITIN: Ferritin: 192 ng/mL (ref 11–307)

## 2019-09-04 LAB — PROTIME-INR
INR: 1.5 — ABNORMAL HIGH (ref 0.8–1.2)
Prothrombin Time: 18.4 seconds — ABNORMAL HIGH (ref 11.4–15.2)

## 2019-09-04 LAB — C-REACTIVE PROTEIN: CRP: 4.2 mg/dL — ABNORMAL HIGH (ref <1.0)

## 2019-09-04 LAB — MAGNESIUM: Magnesium: 2.4 mg/dL (ref 1.7–2.4)

## 2019-09-04 MED ORDER — PRO-STAT SUGAR FREE PO LIQD
30.0000 mL | Freq: Two times a day (BID) | ORAL | Status: DC
  Administered 2019-09-04 – 2019-09-06 (×4): 30 mL via ORAL
  Filled 2019-09-04 (×4): qty 30

## 2019-09-04 MED ORDER — WARFARIN SODIUM 1 MG PO TABS
2.0000 mg | ORAL_TABLET | Freq: Once | ORAL | Status: AC
  Administered 2019-09-04: 17:00:00 2 mg via ORAL
  Filled 2019-09-04: qty 2

## 2019-09-04 MED ORDER — BOOST / RESOURCE BREEZE PO LIQD CUSTOM
1.0000 | Freq: Three times a day (TID) | ORAL | Status: DC
  Administered 2019-09-04 – 2019-09-06 (×5): 1 via ORAL

## 2019-09-04 MED ORDER — ADULT MULTIVITAMIN W/MINERALS CH
1.0000 | ORAL_TABLET | Freq: Every day | ORAL | Status: DC
  Administered 2019-09-04 – 2019-09-06 (×3): 1 via ORAL
  Filled 2019-09-04 (×3): qty 1

## 2019-09-04 MED ORDER — WARFARIN - PHARMACIST DOSING INPATIENT: Freq: Every day | Status: DC

## 2019-09-04 NOTE — Progress Notes (Signed)
ANTICOAGULATION CONSULT NOTE - Initial Consult  Pharmacy Consult for warfarin Indication: afib/MVR  Allergies  Allergen Reactions  . Aldactone [Spironolactone] Other (See Comments)    dyspnea  . Amoxicillin Palpitations    Tachycardia Has patient had a PCN reaction causing immediate rash, facial/tongue/throat swelling, SOB or lightheadedness with hypotension: no Has patient had a PCN reaction causing severe rash involving mucus membranes or skin necrosis: {no Has patient had a PCN reaction that required hospitalization {no Has patient had a PCN reaction occurring within the last 10 years: {yes If all of the above answers are "NO", then may proceed with Cephalosporin use.  . Diltiazem Other (See Comments)    Causing headaches   . Flagyl [Metronidazole Hcl] Other (See Comments)    Causing headaches    . Flovent [Fluticasone Propionate] Other (See Comments)    Leg cramps  . Gabapentin Swelling  . Lyrica [Pregabalin] Swelling  . Quinidine Diarrhea and Other (See Comments)    Fever diarrhea  . Simvastatin Other (See Comments)    Leg pain, myalgia  . Tramadol Nausea Only  . Verapamil Other (See Comments)    myalgias  . Amlodipine     Low extremity edema  . Zetia [Ezetimibe]     LEG CRAMPS   . Ace Inhibitors Other (See Comments)    unknown  . Benazepril Hcl Cough  . Ciprocin-Fluocin-Procin [Fluocinolone Acetonide] Other (See Comments)    unknown  . Ciprofloxacin Diarrhea  . Codeine Nausea Only  . Nitrofurantoin Monohyd Macro Nausea Only    Patient Measurements:     Vital Signs: Temp: 97.9 F (36.6 C) (12/08 0548) Temp Source: Oral (12/08 0548) BP: 120/60 (12/08 0548) Pulse Rate: 70 (12/08 0548)  Labs: Recent Labs    09/02/19 0156 09/02/19 0443 09/03/19 0440 09/04/19 0452  HGB 13.2  --  11.5* 9.2*  HCT 39.0  --  34.4* 27.9*  PLT 300  --  300 242  LABPROT  --   --  >90.0* 18.4*  INR  --   --  >10.0* 1.5*  CREATININE 0.86  --  1.13* 0.90  TROPONINIHS 13  16 20*  --     Estimated Creatinine Clearance: 41.6 mL/min (by C-G formula based on SCr of 0.9 mg/dL).   Medical History: Past Medical History:  Diagnosis Date  . Aortic stenosis, mild 07/26/2017  . Arthritis   . Asthma    last attack 02/2015  . Atrial fibrillation, chronic ([place])   . Cellulitis of left lower extremity 08/17/2016   Ulcer associated with severe venous insufficiency  . Chronic anticoagulation   . Chronic diastolic CHF (congestive heart failure) ([place])   . Chronic kidney disease    "RIGHT MANY KIDNEY INFECTIONS AND STONES"  . COPD (chronic obstructive pulmonary disease) ([place])   . Coronary artery disease   . Dizziness   . H/O: rheumatic fever   . Heart murmur   . Hypertension   . PONV (postoperative nausea and vomiting)    ' SOMETIMES', BUT NOT ALWAYS"  . S/P Maze operation for atrial fibrillation 10/14/2016   Complete bilateral atrial lesion set using cryothermy and bipolar radiofrequency ablation - atrial appendage was not treated due to previous surgical procedure (open mitral commissurotomy)  . S/P mitral valve replacement with bioprosthetic valve 10/14/2016   29 mm Medtronic Mosaic porcine bioprosthetic tissue valve  . UTI (urinary tract infection)   . Valvular heart disease    Has mitral stenosis with prior mitral commissurotomy in 1970    Medications:  Medications Prior to Admission  Medication Sig Dispense Refill Last Dose  . acetaminophen (TYLENOL) 500 MG tablet Take 500 mg by mouth daily.    Past Week at Unknown time  . albuterol (PROVENTIL HFA;VENTOLIN HFA) 108 (90 Base) MCG/ACT inhaler Inhale 2 puffs into the lungs every 6 (six) hours as needed for wheezing or shortness of breath. 1 Inhaler 0 Past Week at Unknown time  . aspirin EC 81 MG EC tablet Take 1 tablet (81 mg total) by mouth daily.   Past Week at Unknown time  . benzonatate (TESSALON) 100 MG capsule Take 1 capsule (100 mg total) by mouth 3 (three) times daily as needed for cough. 30 capsule 0  unknown  . budesonide-formoterol (SYMBICORT) 80-4.5 MCG/ACT inhaler 2 PUFFS 2 TIMES A DAY (Patient taking differently: Inhale 2 puffs into the lungs 2 (two) times daily. ) 10.2 g 6 Past Week at Unknown time  . diazepam (VALIUM) 5 MG tablet TAKE ONE TABLET AT BEDTIME (Patient taking differently: Take 5 mg by mouth at bedtime. ) 30 tablet 5 Past Week at Unknown time  . doxazosin (CARDURA) 4 MG tablet TAKE 1 TABLET DAILY. STOP LOSARTAN (Patient taking differently: Take 4 mg by mouth daily. ) 90 tablet 0 Past Week at Unknown time  . furosemide (LASIX) 40 MG tablet TAKE 1 TABLET DAILY. MAY TAKE 1 EXTRA AS NEEDED FOR SWELLING (Patient taking differently: Take 40 mg by mouth daily. *May take one additional tablet daily as needed for swelling) 60 tablet 6 Past Week at Unknown time  . Multiple Vitamins-Minerals (HAIR/SKIN/NAILS/BIOTIN PO) Take 1 tablet by mouth daily.   Past Week at Unknown time  . mupirocin ointment (BACTROBAN) 2 % Place 1 application into the nose 2 (two) times daily as needed (for affected areas below knee).   Past Month at Unknown time  . ondansetron (ZOFRAN ODT) 4 MG disintegrating tablet Take 1 tablet (4 mg total) by mouth every 8 (eight) hours as needed for nausea or vomiting. 15 tablet 0 unknown  . OVER THE COUNTER MEDICATION Apply 1 application topically at bedtime as needed (Leg Pain). Magni-Life topical cream for leg pain.   unknown  . potassium chloride (KLOR-CON) 10 MEQ tablet If taking 1 lasix tab- Take 2 tablets by mouth twice a day.  If 2 tabs of lasix, then take 5 tabs of potassium (Patient taking differently: Take 10 mEq by mouth See admin instructions. If taking 1 lasix tab- Take 2 tablets by mouth twice a day.  If 2 tabs of lasix, then take 5 tabs of potassium)   Past Week at Unknown time  . promethazine (PHENERGAN) 25 MG tablet Take 0.5 tablets (12.5 mg total) by mouth every 6 (six) hours as needed for nausea or vomiting. 15 tablet 0 Past Week at Unknown time  . PROMETHEGAN  12.5 MG suppository Place 12.5 mg rectally every 8 (eight) hours as needed for nausea or vomiting.    unknown  . rosuvastatin (CRESTOR) 5 MG tablet Take 2.5mg  every Monday and Friday for 2 weeks, then increase to 5mg  every Monday and Friday if able to tolerate. (Patient taking differently: Take 5 mg by mouth 2 (two) times a week. 5mg  every Monday and Friday if able to tolerate.) 20 tablet 1 Past Week at Unknown time  . triamterene-hydrochlorothiazide (MAXZIDE-25) 37.5-25 MG tablet Take 1 tablet by mouth daily. 90 tablet 3 Past Week at Unknown time  . warfarin (COUMADIN) 2 MG tablet Take 1 tablet daily except 2 on Mondays or AS  DIRECTED BY ANTICOAGULATION CLINIC  90 DAY (Patient taking differently: Take 2-4 mg by mouth See admin instructions. Take 1 tablet daily except 2 on Mondays or AS DIRECTED BY ANTICOAGULATION CLINIC  90 DAY) 105 tablet 1 Past Week at Unknown time    Assessment: Pharmacy consulted to dose warfarin in patient with atrial fibrillation and bioprosthetic mitral valve.  INR supratherapeutic >10 on admission but currently 1.5 after 2.5 mg of vitamin K given on 12/7.  Home dose listed as 4 mg on Monday and 2 mg ROW.  Goal of Therapy:  INR 2-3 per anticoag clinic Monitor platelets by anticoagulation protocol: Yes   Plan:  Warfarin 2 mg x 1 dose. Monitor daily INR and s/s of bleeding  [name] 09/04/2019,1:57 PM

## 2019-09-04 NOTE — Progress Notes (Addendum)
Initial Nutrition Assessment  DOCUMENTATION CODES:   Severe malnutrition in context of acute illness/injury  INTERVENTION:  Boost Breeze po TID, each supplement provides 250 kcal and 9 grams of protein   ProStat 30 ml BID (each 30 ml provides 100 kcal, 15 gr protein)   MVI daily  NUTRITION DIAGNOSIS:   Severe Malnutrition related to acute illness(N/V/D and COVID positive) as evidenced by per patient/family report, energy intake < or equal to 50% for > or equal to 5 days, percent weight loss >5% in 1 month.   GOAL:  Patient will meet greater than or equal to 90% of their needs    MONITOR:  Diet advancement, Supplement acceptance, PO intake, Labs, Weight trends    REASON FOR ASSESSMENT:   Malnutrition Screening Tool    ASSESSMENT: Patient is an 84 yo female with hx of COPD, CHF, Hypertension and CAD. She presents with nausea, vomiting and diarrhea. COVID positive. Patient husband diagnosed over [place] during Thanksgiving and she was sick for about 1 week before being tested.   Patient says, she has been eating poorly the past 2 weeks. Bland foods including: Crackers, soup, jello and was taking her meds with applesauce. She was avoiding dairy products.  Talked with patient via telephone reports tolerating Clear liquids with no nausea or vomiting. Had loose stool last night. Increased nutrition needs related to COVID.   Acute weight loss of 6 kg (9%) compared to 1 month ago. Patient reports usual weight of 153 lb which coincides with her documented weight since May 2020.  Medications reviewed and include: Crestor, Decadron, Zosyn  Labs: BMP Latest Ref Rng & Units 09/04/2019 09/03/2019 09/02/2019  Glucose 70 - 99 mg/dL 130(H) 154(H) 156(H)  BUN 8 - 23 mg/dL 23 26(H) 16  Creatinine 0.44 - 1.00 mg/dL 0.90 1.13(H) 0.86  BUN/Creat Ratio 12 - 28 - - -  Sodium 135 - 145 mmol/L 141 141 138  Potassium 3.5 - 5.1 mmol/L 3.3(L) 3.8 3.1(L)  Chloride 98 - 111 mmol/L 104 101 102  CO2 22 -  32 mmol/L 27 26 25   Calcium 8.9 - 10.3 mg/dL 8.8(L) 9.1 8.8(L)    NUTRITION - FOCUSED PHYSICAL EXAM: Deferred today  Diet Order:   Diet Order            Diet clear liquid Room service appropriate? Yes; Fluid consistency: Thin  Diet effective now              EDUCATION NEEDS:  No education needs have been identified at this time   Skin:  Skin Assessment: Reviewed RN Assessment  Last BM:  12/7  Height:   Ht Readings from Last 1 Encounters:  09/02/19 5\' 4"  (1.626 m)    Weight:   Wt Readings from Last 1 Encounters:  09/02/19 62.6 kg    Ideal Body Weight:  55 kg  BMI:  There is no height or weight on file to calculate BMI.  Estimated Nutritional Needs:   Kcal:  [id number]  Protein:  88-101 gr  Fluid:  >1500 ml daily   Colman Cater MS,RD,CSG,LDN Office: [phone] Pager: [phone]

## 2019-09-04 NOTE — Progress Notes (Signed)
PROGRESS NOTE  Catherine [name] [id number] DOB: 18-Apr-1935 DOA: 09/03/2019 PCP: [name], MD  Brief History:  84 year old female with a history of atrial fibrillation on warfarin, hypertension, mitral valve replacement as part of MAZE procedure, aortic stenosis, coronary disease with CABG, and diastolic CHF presenting with nausea, vomiting, and diarrhea.  The patient stated that she had not been feeling well for at least the past 2 weeks.  She was tested to be Covid positive at an outside facility on 08/22/2019.  The patient had an ED visit on 09/02/2019.  CT of the abdomen and pelvis at that time showed wall thickening of the jejunum with mucosal hyperemia and adjacent fat stranding.  The patient was treated symptomatically and discharged home in stable condition.  She returned to the ED on 09/03/2019 with persistent symptoms as above with abdominal pain and progressive weakness.  Patient stated that she was having 3-4 loose bowel movements on a daily basis for the past 2 to 3 days as well as emesis.  Patient also had coughing with subjective fevers and chills and myalgias.  She had denied any shortness of breath, chest pain, hematochezia, melena, dysuria, hematuria. In the emergency department, the patient was noted to have WBC 19.5 with mild hypoxia of 89% on room air.  INR was noted to be > 10 without any overt bleeding.  EKG showed atrial fibrillation at 75 bpm.  Patient was given vitamin K and started on IV fluids and IV Zosyn.  She was placed on nasal cannula oxygen with saturation up to 98% on 2 L.  Assessment/Plan: COVID-19 enteritis -Stool pathogen panel was ordered, but the patient has not had any watery diarrhea since arrival to the medical floor -She seems to be improving from a GI standpoint -Continue Zosyn empirically for now -Gradually advance diet as tolerated  Acute respiratory failure with hypoxia -Initially placed on 2 L nasal cannula -Continue dexamethasone  and remdesivir -Weaned to room air 09/04/2019 am -CRP 4.7>>> 4.2 -LDH 239 -Ferritin 110>>> 182  COVID-19 pneumonia -Management as discussed above  CKD stage 3 -baseline creatinine 0.8-0.9 -presented with serum creatinine 1.13  Supratherapeutic INR secondary to Coumadin use -Coumadin discontinued for now -No overt bleeding identified -Vitamin K given in ED -now INR subtherapeutic  Chronic atrial fibrillation -restart coumadin -Monitor on telemetry -no bridging needed as pt without hx of CVA  Bioprosthetic mitral valve -part of MAZE procedure  CAD/CABG -Continue aspirin and statin -no chest pain  Chronic Diastolic CHF -Currently stable, follow daily weights -Last 2D echocardiogram 06/2018 with LVEF 65-70% -Hold Lasix for now due to volume depletion--reevaluate in am for restart  Hypertension-controlled -Continue Cardura -Hold triamterene/HCTZ -Labetalol as needed for significant elevations   Disposition Plan:   Home in 1-2 days  Family Communication:  No Family at bedside  Consultants:  none  Code Status:  FULL   DVT Prophylaxis:  coumadin   Procedures: As Listed in Progress Note Above  Antibiotics: None       Subjective: Pt is feeling stronger but remains weak.  She only had one BM last 24 hours.  Vomiting is better, but remains nauseous.  Denies f/c, cp, sob, abd pain.  Objective: Vitals:   09/03/19 2001 09/03/19 2047 09/04/19 0548 09/04/19 0739  BP:  115/62 120/60   Pulse:  86 70   Resp:  20 16   Temp:   97.9 F (36.6 C)   TempSrc:   Oral  SpO2: 98% 99% 97% 96%    Intake/Output Summary (Last 24 hours) at 09/04/2019 1211 Last data filed at 09/03/2019 2130 Gross per 24 hour  Intake 1414.45 ml  Output 2 ml  Net 1412.45 ml   Weight change:  Exam:   General:  Pt is alert, follows commands appropriately, not in acute distress  HEENT: No icterus, No thrush, No neck mass, [place]/AT  Cardiovascular: RRR, S1/S2, no rubs, no  gallops  Respiratory: CTA bilaterally, no wheezing, no crackles, no rhonchi  Abdomen: Soft/+BS, non tender, non distended, no guarding  Extremities: No edema, No lymphangitis, No petechiae, No rashes, no synovitis   Data Reviewed: I have personally reviewed following labs and imaging studies Basic Metabolic Panel: Recent Labs  Lab 09/02/19 0156 09/03/19 0440 09/04/19 0452  NA 138 141 141  K 3.1* 3.8 3.3*  CL 102 101 104  CO2 25 26 27   GLUCOSE 156* 154* 130*  BUN 16 26* 23  CREATININE 0.86 1.13* 0.90  CALCIUM 8.8* 9.1 8.8*  MG  --   --  2.4   Liver Function Tests: Recent Labs  Lab 09/02/19 0156 09/03/19 0440 09/04/19 0452  AST 30 23 20   ALT 26 20 15   ALKPHOS 88 82 69  BILITOT 0.8 0.9 0.8  PROT 7.0 6.9 6.1*  ALBUMIN 3.7 3.4* 3.0*   Recent Labs  Lab 09/02/19 0156 09/03/19 0440  LIPASE 30 22   No results for input(s): AMMONIA in the last 168 hours. Coagulation Profile: Recent Labs  Lab 09/03/19 0440 09/04/19 0452  INR >10.0* 1.5*   CBC: Recent Labs  Lab 09/02/19 0156 09/03/19 0440 09/04/19 0452  WBC 17.8* 19.5* 12.2*  NEUTROABS 15.5* 16.5*  --   HGB 13.2 11.5* 9.2*  HCT 39.0 34.4* 27.9*  MCV 87.8 91.0 90.3  PLT 300 300 242   Cardiac Enzymes: No results for input(s): CKTOTAL, CKMB, CKMBINDEX, TROPONINI in the last 168 hours. BNP: Invalid input(s): POCBNP CBG: No results for input(s): GLUCAP in the last 168 hours. HbA1C: No results for input(s): HGBA1C in the last 72 hours. Urine analysis:    Component Value Date/Time   COLORURINE AMBER (A) 09/03/2019 0421   APPEARANCEUR HAZY (A) 09/03/2019 0421   LABSPEC 1.035 (H) 09/03/2019 0421   PHURINE 5.0 09/03/2019 0421   GLUCOSEU NEGATIVE 09/03/2019 0421   HGBUR NEGATIVE 09/03/2019 0421   BILIRUBINUR NEGATIVE 09/03/2019 0421   BILIRUBINUR negative 07/09/2019 1451   KETONESUR NEGATIVE 09/03/2019 0421   PROTEINUR 30 (A) 09/03/2019 0421   UROBILINOGEN 0.2 07/09/2019 1451   UROBILINOGEN 0.2  03/15/2015 2151   NITRITE NEGATIVE 09/03/2019 0421   LEUKOCYTESUR NEGATIVE 09/03/2019 0421   Sepsis Labs: @LABRCNTIP (procalcitonin:4,lacticidven:4) ) Recent Results (from the past 240 hour(s))  Urine culture     Status: None   Collection Time: 09/03/19  6:11 AM   Specimen: Urine, Random  Result Value Ref Range Status   Specimen Description   Final    URINE, RANDOM Performed at [facility], [address] [place], [place] [address]    Special Requests   Final    NONE Performed at [facility], [address] [place], [place] [address]    Culture   Final    NO GROWTH Performed at [facility], [place] [address], [place], [place] [address]    Report Status 09/04/2019 FINAL  Final  Blood culture (routine x 2)     Status: None (Preliminary result)   Collection Time: 09/03/19  6:46 AM   Specimen: BLOOD  RIGHT ARM  Result Value Ref Range Status   Specimen Description BLOOD RIGHT ARM  Final   Special Requests   Final    BOTTLES DRAWN AEROBIC AND ANAEROBIC Blood Culture adequate volume   Culture   Final    NO GROWTH 1 DAY Performed at [facility], [address] [place], [place] [address]    Report Status PENDING  Incomplete  Blood culture (routine x 2)     Status: None (Preliminary result)   Collection Time: 09/03/19  6:49 AM   Specimen: BLOOD RIGHT HAND  Result Value Ref Range Status   Specimen Description BLOOD RIGHT HAND  Final   Special Requests   Final    BOTTLES DRAWN AEROBIC ONLY Blood Culture results may not be optimal due to an inadequate volume of blood received in culture bottles   Culture   Final    NO GROWTH 1 DAY Performed at [facility], [address] [place], [place] [address]    Report Status PENDING  Incomplete     Scheduled Meds:  aspirin EC  81 mg Oral Daily   dexamethasone (DECADRON) injection  6 mg Intravenous Daily   diazepam  5 mg Oral QHS   doxazosin  4 mg Oral Daily   feeding supplement  1 Container Oral TID BM   feeding  supplement (PRO-STAT SUGAR FREE 64)  30 mL Oral BID   mometasone-formoterol  2 puff Inhalation BID   multivitamin with minerals  1 tablet Oral Daily   rosuvastatin  5 mg Oral Once per day on Mon Fri   Continuous Infusions:  piperacillin-tazobactam (ZOSYN)  IV 3.375 g (09/04/19 0640)   remdesivir 100 mg in NS 100 mL      Procedures/Studies: Ct Abdomen Pelvis W Contrast  Result Date: 09/02/2019 CLINICAL DATA:  Initial evaluation for acute nausea, vomiting. EXAM: CT ABDOMEN AND PELVIS WITH CONTRAST TECHNIQUE: Multidetector CT imaging of the abdomen and pelvis was performed using the standard protocol following bolus administration of intravenous contrast. CONTRAST:  66mL OMNIPAQUE IOHEXOL 300 MG/ML  SOLN COMPARISON:  None available. FINDINGS: Lower chest: Scattered subpleural reticular and ground-glass opacities seen within the bilateral lung bases, suspected reflect fibrosis and/or scarring. Visualized lungs are otherwise clear. Prominent coronary artery calcifications. Prosthetic mitral valve noted. No pleural pericardial effusion. Hepatobiliary: Liver demonstrates a normal contrast enhanced appearance. Focal fat deposition noted a sent to the falciform ligament. Gallbladder surgically absent. No biliary dilatation. Pancreas: Pancreas within normal limits. Spleen: Spleen within normal limits. Adrenals/Urinary Tract: Adrenal glands are normal. Kidneys equal in size with symmetric enhancement. Few scattered subcentimeter hypodensities noted, too small the characterize, but statistically likely reflects small cyst. No focal enhancing renal mass. Punctate nonobstructive left renal nephrolithiasis. No hydronephrosis. No hydroureter or visible ureterolithiasis. Bladder largely decompressed without acute abnormality. Stomach/Bowel: Stomach largely decompressed without acute abnormality. Few scattered loops of prominent jejunum seen clustered within the mid abdomen demonstrate prominent wall thickening and  mucosal hyperemia, suggesting acute enteritis. Adjacent hazy inflammatory fat stranding and small volume free fluid. No evidence for associated obstruction, perforation, or other complication. No pneumatosis or portal venous gas. Small bowel decompressed distally. Appendix not definitely visualized.: Of normal caliber without acute inflammatory changes. Moderate retained stool within the distal colon, which could reflect constipation. Vascular/Lymphatic: Moderate aorto bi-iliac atherosclerotic disease. No aneurysm. Mesenteric vessels patent proximally. No adenopathy. Reproductive: Uterus is absent. Ovaries not confidently identified. No adnexal mass. Other: Small volume free fluid adjacent to the spleen and along the left pericolic gutter  as well as within the pelvis, likely reactive. No free intraperitoneal air. Small fat containing paraumbilical hernia noted without associated inflammation. Musculoskeletal: External soft tissues demonstrate no acute finding. No acute osseous abnormality. No discrete lytic or blastic osseous lesions. Prior PLIF at L5-S1 noted. IMPRESSION: 1. Prominent wall thickening with mucosal enhancement involving a few scattered loops of jejunum within the mid abdomen, suggesting acute enteritis. No evidence for associated obstruction, perforation, or other complication. 2. Small volume free fluid within the abdomen and pelvis, likely reactive. 3. Moderate retained stool within the distal colon, which could reflect constipation. 4. Punctate nonobstructive left renal nephrolithiasis. 5. Moderate aorto bi-iliac atherosclerotic disease. Electronically Signed   By: Jeannine Boga M.D.   On: 09/02/2019 04:28   Dg Chest Port 1 View  Result Date: 09/03/2019 CLINICAL DATA:  Shortness of breath in a patient who is COVID-19 positive. EXAM: PORTABLE CHEST 1 VIEW COMPARISON:  Single-view of the chest 08/29/2019. PA and lateral chest 10/30/2018. FINDINGS: Mild bibasilar atelectasis is present. No  consolidative process, pneumothorax or effusion. Heart size is upper normal. The patient is status post CABG. Atherosclerosis noted. IMPRESSION: No acute disease. Atherosclerosis. Electronically Signed   By: Inge Rise M.D.   On: 09/03/2019 11:19   Dg Chest Portable 1 View  Result Date: 08/29/2019 CLINICAL DATA:  Shortness of breath EXAM: PORTABLE CHEST 1 VIEW COMPARISON:  October 30, 2018 FINDINGS: Small left pleural effusion and adjacent atelectasis. No pneumothorax. Normal heart size with evidence of prior CABG and valve replacement. Cervicothoracic fusion hardware is noted IMPRESSION: Small left pleural effusion and adjacent atelectasis. Electronically Signed   By: Macy Mis M.D.   On: 08/29/2019 11:47    Orson Eva, DO  [facility] Pager [phone]  If 7PM-7AM, please contact night-coverage [email or web address] Password [email or web address] 09/04/2019, 12:11 PM   LOS: 1 day

## 2019-09-05 DIAGNOSIS — E43 Unspecified severe protein-calorie malnutrition: Secondary | ICD-10-CM | POA: Insufficient documentation

## 2019-09-05 LAB — COMPREHENSIVE METABOLIC PANEL
ALT: 15 U/L (ref 0–44)
AST: 21 U/L (ref 15–41)
Albumin: 2.8 g/dL — ABNORMAL LOW (ref 3.5–5.0)
Alkaline Phosphatase: 59 U/L (ref 38–126)
Anion gap: 9 (ref 5–15)
BUN: 25 mg/dL — ABNORMAL HIGH (ref 8–23)
CO2: 29 mmol/L (ref 22–32)
Calcium: 8.8 mg/dL — ABNORMAL LOW (ref 8.9–10.3)
Chloride: 103 mmol/L (ref 98–111)
Creatinine, Ser: 0.93 mg/dL (ref 0.44–1.00)
GFR calc Af Amer: >60 mL/min (ref >60)
GFR calc non Af Amer: 57 mL/min — ABNORMAL LOW (ref >60)
Glucose, Bld: 106 mg/dL — ABNORMAL HIGH (ref 70–99)
Potassium: 3.4 mmol/L — ABNORMAL LOW (ref 3.5–5.1)
Sodium: 141 mmol/L (ref 135–145)
Total Bilirubin: 0.6 mg/dL (ref 0.3–1.2)
Total Protein: 5.7 g/dL — ABNORMAL LOW (ref 6.5–8.1)

## 2019-09-05 LAB — C-REACTIVE PROTEIN: CRP: 1 mg/dL — ABNORMAL HIGH (ref <1.0)

## 2019-09-05 LAB — MAGNESIUM: Magnesium: 2.6 mg/dL — ABNORMAL HIGH (ref 1.7–2.4)

## 2019-09-05 LAB — CBC
HCT: 25.2 % — ABNORMAL LOW (ref 36.0–46.0)
Hemoglobin: 8.3 g/dL — ABNORMAL LOW (ref 12.0–15.0)
MCH: 29.7 pg (ref 26.0–34.0)
MCHC: 32.9 g/dL (ref 30.0–36.0)
MCV: 90.3 fL (ref 80.0–100.0)
Platelets: 227 10*3/uL (ref 150–400)
RBC: 2.79 MIL/uL — ABNORMAL LOW (ref 3.87–5.11)
RDW: 13.3 % (ref 11.5–15.5)
WBC: 12.2 10*3/uL — ABNORMAL HIGH (ref 4.0–10.5)
nRBC: 0 % (ref 0.0–0.2)

## 2019-09-05 LAB — PROTIME-INR
INR: 1.7 — ABNORMAL HIGH (ref 0.8–1.2)
Prothrombin Time: 19.8 seconds — ABNORMAL HIGH (ref 11.4–15.2)

## 2019-09-05 LAB — GLUCOSE, CAPILLARY: Glucose-Capillary: 139 mg/dL — ABNORMAL HIGH (ref 70–99)

## 2019-09-05 LAB — FERRITIN: Ferritin: 200 ng/mL (ref 11–307)

## 2019-09-05 MED ORDER — WARFARIN SODIUM 1 MG PO TABS
2.0000 mg | ORAL_TABLET | Freq: Once | ORAL | Status: AC
  Administered 2019-09-05: 17:00:00 2 mg via ORAL
  Filled 2019-09-05: qty 2

## 2019-09-05 NOTE — Plan of Care (Signed)

## 2019-09-05 NOTE — Progress Notes (Addendum)
PROGRESS NOTE  Catherine [name] [id number] DOB: [birth year] DOA: 09/03/2019 PCP: [name], MD  Brief History:  84 year old female with a history of atrial fibrillation on warfarin, hypertension, mitral valve replacement as part of MAZE procedure, aortic stenosis, coronary disease with CABG, and diastolic CHF presenting with nausea, vomiting, and diarrhea.  The patient stated that she had not been feeling well for at least the past 2 weeks.  She was tested to be Covid positive at an outside facility on 08/22/2019.  The patient had an ED visit on 09/02/2019.  CT of the abdomen and pelvis at that time showed wall thickening of the jejunum with mucosal hyperemia and adjacent fat stranding.  The patient was treated symptomatically and discharged home in stable condition.  She returned to the ED on 09/03/2019 with persistent symptoms as above with abdominal pain and progressive weakness.  Patient stated that she was having 3-4 loose bowel movements on a daily basis for the past 2 to 3 days as well as emesis.  Patient also had coughing with subjective fevers and chills and myalgias.  She had denied any shortness of breath, chest pain, hematochezia, melena, dysuria, hematuria. In the emergency department, the patient was noted to have WBC 19.5 with mild hypoxia of 89% on room air.  INR was noted to be > 10 without any overt bleeding.  EKG showed atrial fibrillation at 75 bpm.  Patient was given vitamin K and started on IV fluids and IV Zosyn.  She was placed on nasal cannula oxygen with saturation up to 98% on 2 L.  Assessment/Plan: COVID-19 enteritis -Stool pathogen panel was ordered, but the patient has not had any watery diarrhea since arrival to the medical floor -She seems to be improving from a GI standpoint -Continue Zosyn empirically for now. -Gradually advancing diet as tolerated.  Acute respiratory failure with hypoxia -In the setting of COVID-19 pneumonia -Continue  dexamethasone and remdesivir -Weaned and maintain on room air as tolerated. -CRP 4.7>>> 4.2 -LDH 239 -Ferritin 110>>> 182  COVID-19 pneumonia -Management as discussed above  CKD stage 3 -baseline creatinine 0.8-0.9 -presented with serum creatinine 1.13  Supratherapeutic INR secondary to Coumadin use -Coumadin discontinued for now -No overt bleeding identified -Vitamin K given in ED -now INR subtherapeutic -Continue Coumadin dose per pharmacy.  Chronic atrial fibrillation -restart coumadin -Monitor on telemetry -no bridging needed as pt without hx of CVA  Bioprosthetic mitral valve -part of MAZE procedure  CAD/CABG -Continue aspirin and statin -no chest pain  Chronic Diastolic CHF -Currently stable euvolemic -Continue to follow daily weights -Last 2D echocardiogram 06/2018 with LVEF 65-70% -Strict I's and O's low-sodium diet has been discussed with patient. -Hold Lasix for now due to volume depletion-  Hypertension-controlled -Continue Cardura -continue to Hold triamterene/HCTZ -Labetalol as needed for significant elevations  Severe protein calorie malnutrition -in the setting of acute illness and inability to tolerate PO's -will continue advancing diet as tolerated gradually -Follow nutritional service recommendations.   Disposition Plan:   Home in 1-2 days  Family Communication:  No Family at bedside  Consultants:  none  Code Status:  FULL   DVT Prophylaxis:  coumadin   Procedures: As Listed in Progress Note Above  Antibiotics: Zosyn     Subjective: Patient reports feeling better, but he still with intermittent nausea and afraid to diarrhea and vomiting will return when she started eating.  No chest pain, no fever, no shortness of breath or  abdominal pain currently.  Objective: Vitals:   09/05/19 0517 09/05/19 0818 09/05/19 0900 09/05/19 1337  BP: 116/74   (!) 120/51  Pulse: (!) 59   (!) 55  Resp: 20   17  Temp: 98.3 F (36.8 C)   98.1  F (36.7 C)  TempSrc: Oral   Oral  SpO2: 99% 97%  96%  Weight: 65.5 kg     Height:   5\' 4"  (1.626 m)     Intake/Output Summary (Last 24 hours) at 09/05/2019 1720 Last data filed at 09/05/2019 1300 Gross per 24 hour  Intake 840 ml  Output --  Net 840 ml   Weight change:   Exam: General exam: Alert, awake, oriented x 3, no fever, no chest pain, no further vomiting.  Patient reports 1 soft BM overnight and no further diarrhea.  Still intermittently nauseated.  No requiring oxygen supplementation Respiratory system: Good air movement bilaterally; positive rhonchi, no using accessory muscles; no wheezing. Cardiovascular system: RRR. No murmurs, rubs, gallops. Gastrointestinal system: Abdomen is nondistended, soft and nontender. No organomegaly or masses felt. Normal bowel sounds heard. Central nervous system: Alert and oriented. No focal neurological deficits. Extremities: No C/C/E, +pedal pulses Skin: No rashes, lesions or ulcers Psychiatry: Judgement and insight appear normal. Mood & affect appropriate.    Data Reviewed: I have personally reviewed following labs and imaging studies  Basic Metabolic Panel: Recent Labs  Lab 09/02/19 0156 09/03/19 0440 09/04/19 0452 09/05/19 0505  NA 138 141 141 141  K 3.1* 3.8 3.3* 3.4*  CL 102 101 104 103  CO2 25 26 27 29   GLUCOSE 156* 154* 130* 106*  BUN 16 26* 23 25*  CREATININE 0.86 1.13* 0.90 0.93  CALCIUM 8.8* 9.1 8.8* 8.8*  MG  --   --  2.4 2.6*   Liver Function Tests: Recent Labs  Lab 09/02/19 0156 09/03/19 0440 09/04/19 0452 09/05/19 0505  AST 30 23 20 21   ALT 26 20 15 15   ALKPHOS 88 82 69 59  BILITOT 0.8 0.9 0.8 0.6  PROT 7.0 6.9 6.1* 5.7*  ALBUMIN 3.7 3.4* 3.0* 2.8*   Recent Labs  Lab 09/02/19 0156 09/03/19 0440  LIPASE 30 22   No results for input(s): AMMONIA in the last 168 hours. Coagulation Profile: Recent Labs  Lab 09/03/19 0440 09/04/19 0452 09/05/19 0505  INR >10.0* 1.5* 1.7*   CBC: Recent Labs   Lab 09/02/19 0156 09/03/19 0440 09/04/19 0452 09/05/19 0505  WBC 17.8* 19.5* 12.2* 12.2*  NEUTROABS 15.5* 16.5*  --   --   HGB 13.2 11.5* 9.2* 8.3*  HCT 39.0 34.4* 27.9* 25.2*  MCV 87.8 91.0 90.3 90.3  PLT 300 300 242 227   Urine analysis:    Component Value Date/Time   COLORURINE AMBER (A) 09/03/2019 0421   APPEARANCEUR HAZY (A) 09/03/2019 0421   LABSPEC 1.035 (H) 09/03/2019 0421   PHURINE 5.0 09/03/2019 0421   GLUCOSEU NEGATIVE 09/03/2019 0421   HGBUR NEGATIVE 09/03/2019 0421   BILIRUBINUR NEGATIVE 09/03/2019 0421   BILIRUBINUR negative 07/09/2019 1451   KETONESUR NEGATIVE 09/03/2019 0421   PROTEINUR 30 (A) 09/03/2019 0421   UROBILINOGEN 0.2 07/09/2019 1451   UROBILINOGEN 0.2 03/15/2015 2151   NITRITE NEGATIVE 09/03/2019 0421   LEUKOCYTESUR NEGATIVE 09/03/2019 0421    Recent Results (from the past 240 hour(s))  Urine culture     Status: None   Collection Time: 09/03/19  6:11 AM   Specimen: Urine, Random  Result Value Ref Range Status   Specimen  Description   Final    URINE, RANDOM Performed at [facility], [address] [place], [place] [address]    Special Requests   Final    NONE Performed at [facility], [address], [place], [place] [address]    Culture   Final    NO GROWTH Performed at [facility], [place] [address], [place], [place] [address]    Report Status 09/04/2019 FINAL  Final  Blood culture (routine x 2)     Status: None (Preliminary result)   Collection Time: 09/03/19  6:46 AM   Specimen: BLOOD RIGHT ARM  Result Value Ref Range Status   Specimen Description BLOOD RIGHT ARM  Final   Special Requests   Final    BOTTLES DRAWN AEROBIC AND ANAEROBIC Blood Culture adequate volume   Culture   Final    NO GROWTH 2 DAYS Performed at [facility], [address], [place], [place] [address]    Report Status PENDING  Incomplete  Blood culture (routine x 2)     Status: None (Preliminary result)   Collection Time: 09/03/19  6:49 AM    Specimen: BLOOD RIGHT HAND  Result Value Ref Range Status   Specimen Description BLOOD RIGHT HAND  Final   Special Requests   Final    BOTTLES DRAWN AEROBIC ONLY Blood Culture results may not be optimal due to an inadequate volume of blood received in culture bottles   Culture   Final    NO GROWTH 2 DAYS Performed at [facility], [address] [place],  [address]    Report Status PENDING  Incomplete     Scheduled Meds:  aspirin EC  81 mg Oral Daily   dexamethasone (DECADRON) injection  6 mg Intravenous Daily   diazepam  5 mg Oral QHS   doxazosin  4 mg Oral Daily   feeding supplement  1 Container Oral TID BM   feeding supplement (PRO-STAT SUGAR FREE 64)  30 mL Oral BID   mometasone-formoterol  2 puff Inhalation BID   multivitamin with minerals  1 tablet Oral Daily   rosuvastatin  5 mg Oral Once per day on Mon Fri   Warfarin - Pharmacist Dosing Inpatient   Does not apply q1800   Continuous Infusions:  piperacillin-tazobactam (ZOSYN)  IV 3.375 g (09/05/19 1454)   remdesivir 100 mg in NS 100 mL 100 mg (09/05/19 1031)    Procedures/Studies: Ct Abdomen Pelvis W Contrast  Result Date: 09/02/2019 CLINICAL DATA:  Initial evaluation for acute nausea, vomiting. EXAM: CT ABDOMEN AND PELVIS WITH CONTRAST TECHNIQUE: Multidetector CT imaging of the abdomen and pelvis was performed using the standard protocol following bolus administration of intravenous contrast. CONTRAST:  49mL OMNIPAQUE IOHEXOL 300 MG/ML  SOLN COMPARISON:  None available. FINDINGS: Lower chest: Scattered subpleural reticular and ground-glass opacities seen within the bilateral lung bases, suspected reflect fibrosis and/or scarring. Visualized lungs are otherwise clear. Prominent coronary artery calcifications. Prosthetic mitral valve noted. No pleural pericardial effusion. Hepatobiliary: Liver demonstrates a normal contrast enhanced appearance. Focal fat deposition noted a sent to the falciform ligament.  Gallbladder surgically absent. No biliary dilatation. Pancreas: Pancreas within normal limits. Spleen: Spleen within normal limits. Adrenals/Urinary Tract: Adrenal glands are normal. Kidneys equal in size with symmetric enhancement. Few scattered subcentimeter hypodensities noted, too small the characterize, but statistically likely reflects small cyst. No focal enhancing renal mass. Punctate nonobstructive left renal nephrolithiasis. No hydronephrosis. No hydroureter or visible ureterolithiasis. Bladder largely decompressed without acute abnormality. Stomach/Bowel: Stomach  largely decompressed without acute abnormality. Few scattered loops of prominent jejunum seen clustered within the mid abdomen demonstrate prominent wall thickening and mucosal hyperemia, suggesting acute enteritis. Adjacent hazy inflammatory fat stranding and small volume free fluid. No evidence for associated obstruction, perforation, or other complication. No pneumatosis or portal venous gas. Small bowel decompressed distally. Appendix not definitely visualized.: Of normal caliber without acute inflammatory changes. Moderate retained stool within the distal colon, which could reflect constipation. Vascular/Lymphatic: Moderate aorto bi-iliac atherosclerotic disease. No aneurysm. Mesenteric vessels patent proximally. No adenopathy. Reproductive: Uterus is absent. Ovaries not confidently identified. No adnexal mass. Other: Small volume free fluid adjacent to the spleen and along the left pericolic gutter as well as within the pelvis, likely reactive. No free intraperitoneal air. Small fat containing paraumbilical hernia noted without associated inflammation. Musculoskeletal: External soft tissues demonstrate no acute finding. No acute osseous abnormality. No discrete lytic or blastic osseous lesions. Prior PLIF at L5-S1 noted. IMPRESSION: 1. Prominent wall thickening with mucosal enhancement involving a few scattered loops of jejunum within the  mid abdomen, suggesting acute enteritis. No evidence for associated obstruction, perforation, or other complication. 2. Small volume free fluid within the abdomen and pelvis, likely reactive. 3. Moderate retained stool within the distal colon, which could reflect constipation. 4. Punctate nonobstructive left renal nephrolithiasis. 5. Moderate aorto bi-iliac atherosclerotic disease. Electronically Signed   By: Jeannine Boga M.D.   On: 09/02/2019 04:28   Dg Chest Port 1 View  Result Date: 09/03/2019 CLINICAL DATA:  Shortness of breath in a patient who is COVID-19 positive. EXAM: PORTABLE CHEST 1 VIEW COMPARISON:  Single-view of the chest 08/29/2019. PA and lateral chest 10/30/2018. FINDINGS: Mild bibasilar atelectasis is present. No consolidative process, pneumothorax or effusion. Heart size is upper normal. The patient is status post CABG. Atherosclerosis noted. IMPRESSION: No acute disease. Atherosclerosis. Electronically Signed   By: Inge Rise M.D.   On: 09/03/2019 11:19   Dg Chest Portable 1 View  Result Date: 08/29/2019 CLINICAL DATA:  Shortness of breath EXAM: PORTABLE CHEST 1 VIEW COMPARISON:  October 30, 2018 FINDINGS: Small left pleural effusion and adjacent atelectasis. No pneumothorax. Normal heart size with evidence of prior CABG and valve replacement. Cervicothoracic fusion hardware is noted IMPRESSION: Small left pleural effusion and adjacent atelectasis. Electronically Signed   By: Macy Mis M.D.   On: 08/29/2019 11:47    Barton Dubois, MD  [facility] Pager [phone]  09/05/2019, 5:20 PM   LOS: 2 days

## 2019-09-05 NOTE — Progress Notes (Signed)
ANTICOAGULATION CONSULT NOTE - Pharmacy Consult for warfarin Indication: afib/MVR  Allergies  Allergen Reactions  . Aldactone [Spironolactone] Other (See Comments)    dyspnea  . Amoxicillin Palpitations    Tachycardia Has patient had a PCN reaction causing immediate rash, facial/tongue/throat swelling, SOB or lightheadedness with hypotension: no Has patient had a PCN reaction causing severe rash involving mucus membranes or skin necrosis: {no Has patient had a PCN reaction that required hospitalization {no Has patient had a PCN reaction occurring within the last 10 years: {yes If all of the above answers are "NO", then may proceed with Cephalosporin use.  . Diltiazem Other (See Comments)    Causing headaches   . Flagyl [Metronidazole Hcl] Other (See Comments)    Causing headaches    . Flovent [Fluticasone Propionate] Other (See Comments)    Leg cramps  . Gabapentin Swelling  . Lyrica [Pregabalin] Swelling  . Quinidine Diarrhea and Other (See Comments)    Fever diarrhea  . Simvastatin Other (See Comments)    Leg pain, myalgia  . Tramadol Nausea Only  . Verapamil Other (See Comments)    myalgias  . Amlodipine     Low extremity edema  . Zetia [Ezetimibe]     LEG CRAMPS   . Ace Inhibitors Other (See Comments)    unknown  . Benazepril Hcl Cough  . Ciprocin-Fluocin-Procin [Fluocinolone Acetonide] Other (See Comments)    unknown  . Ciprofloxacin Diarrhea  . Codeine Nausea Only  . Nitrofurantoin Monohyd Macro Nausea Only    Patient Measurements: Weight: 144 lb 6.4 oz (65.5 kg)   Vital Signs: Temp: 98.3 F (36.8 C) (12/09 0517) Temp Source: Oral (12/09 0517) BP: 116/74 (12/09 0517) Pulse Rate: 59 (12/09 0517)  Labs: Recent Labs    09/03/19 0440 09/04/19 0452 09/05/19 0505  HGB 11.5* 9.2* 8.3*  HCT 34.4* 27.9* 25.2*  PLT 300 242 227  LABPROT >90.0* 18.4* 19.8*  INR >10.0* 1.5* 1.7*  CREATININE 1.13* 0.90 0.93  TROPONINIHS 20*  --   --     Estimated  Creatinine Clearance: 40.3 mL/min (by C-G formula based on SCr of 0.93 mg/dL).   Medical History: Past Medical History:  Diagnosis Date  . Aortic stenosis, mild 07/26/2017  . Arthritis   . Asthma    last attack 02/2015  . Atrial fibrillation, chronic ([place])   . Cellulitis of left lower extremity 08/17/2016   Ulcer associated with severe venous insufficiency  . Chronic anticoagulation   . Chronic diastolic CHF (congestive heart failure) ([place])   . Chronic kidney disease    "RIGHT MANY KIDNEY INFECTIONS AND STONES"  . COPD (chronic obstructive pulmonary disease) ([place])   . Coronary artery disease   . Dizziness   . H/O: rheumatic fever   . Heart murmur   . Hypertension   . PONV (postoperative nausea and vomiting)    ' SOMETIMES', BUT NOT ALWAYS"  . S/P Maze operation for atrial fibrillation 10/14/2016   Complete bilateral atrial lesion set using cryothermy and bipolar radiofrequency ablation - atrial appendage was not treated due to previous surgical procedure (open mitral commissurotomy)  . S/P mitral valve replacement with bioprosthetic valve 10/14/2016   29 mm Medtronic Mosaic porcine bioprosthetic tissue valve  . UTI (urinary tract infection)   . Valvular heart disease    Has mitral stenosis with prior mitral commissurotomy in 1970    Medications:  Medications Prior to Admission  Medication Sig Dispense Refill Last Dose  . acetaminophen (TYLENOL) 500 MG tablet Take 500  mg by mouth daily.    Past Week at Unknown time  . albuterol (PROVENTIL HFA;VENTOLIN HFA) 108 (90 Base) MCG/ACT inhaler Inhale 2 puffs into the lungs every 6 (six) hours as needed for wheezing or shortness of breath. 1 Inhaler 0 Past Week at Unknown time  . aspirin EC 81 MG EC tablet Take 1 tablet (81 mg total) by mouth daily.   Past Week at Unknown time  . benzonatate (TESSALON) 100 MG capsule Take 1 capsule (100 mg total) by mouth 3 (three) times daily as needed for cough. 30 capsule 0 unknown  .  budesonide-formoterol (SYMBICORT) 80-4.5 MCG/ACT inhaler 2 PUFFS 2 TIMES A DAY (Patient taking differently: Inhale 2 puffs into the lungs 2 (two) times daily. ) 10.2 g 6 Past Week at Unknown time  . diazepam (VALIUM) 5 MG tablet TAKE ONE TABLET AT BEDTIME (Patient taking differently: Take 5 mg by mouth at bedtime. ) 30 tablet 5 Past Week at Unknown time  . doxazosin (CARDURA) 4 MG tablet TAKE 1 TABLET DAILY. STOP LOSARTAN (Patient taking differently: Take 4 mg by mouth daily. ) 90 tablet 0 Past Week at Unknown time  . furosemide (LASIX) 40 MG tablet TAKE 1 TABLET DAILY. MAY TAKE 1 EXTRA AS NEEDED FOR SWELLING (Patient taking differently: Take 40 mg by mouth daily. *May take one additional tablet daily as needed for swelling) 60 tablet 6 Past Week at Unknown time  . Multiple Vitamins-Minerals (HAIR/SKIN/NAILS/BIOTIN PO) Take 1 tablet by mouth daily.   Past Week at Unknown time  . mupirocin ointment (BACTROBAN) 2 % Place 1 application into the nose 2 (two) times daily as needed (for affected areas below knee).   Past Month at Unknown time  . ondansetron (ZOFRAN ODT) 4 MG disintegrating tablet Take 1 tablet (4 mg total) by mouth every 8 (eight) hours as needed for nausea or vomiting. 15 tablet 0 unknown  . OVER THE COUNTER MEDICATION Apply 1 application topically at bedtime as needed (Leg Pain). Magni-Life topical cream for leg pain.   unknown  . potassium chloride (KLOR-CON) 10 MEQ tablet If taking 1 lasix tab- Take 2 tablets by mouth twice a day.  If 2 tabs of lasix, then take 5 tabs of potassium (Patient taking differently: Take 10 mEq by mouth See admin instructions. If taking 1 lasix tab- Take 2 tablets by mouth twice a day.  If 2 tabs of lasix, then take 5 tabs of potassium)   Past Week at Unknown time  . promethazine (PHENERGAN) 25 MG tablet Take 0.5 tablets (12.5 mg total) by mouth every 6 (six) hours as needed for nausea or vomiting. 15 tablet 0 Past Week at Unknown time  . PROMETHEGAN 12.5 MG  suppository Place 12.5 mg rectally every 8 (eight) hours as needed for nausea or vomiting.    unknown  . rosuvastatin (CRESTOR) 5 MG tablet Take 2.5mg  every Monday and Friday for 2 weeks, then increase to 5mg  every Monday and Friday if able to tolerate. (Patient taking differently: Take 5 mg by mouth 2 (two) times a week. 5mg  every Monday and Friday if able to tolerate.) 20 tablet 1 Past Week at Unknown time  . triamterene-hydrochlorothiazide (MAXZIDE-25) 37.5-25 MG tablet Take 1 tablet by mouth daily. 90 tablet 3 Past Week at Unknown time  . warfarin (COUMADIN) 2 MG tablet Take 1 tablet daily except 2 on Mondays or [place]  90 DAY (Patient taking differently: Take 2-4 mg by mouth See admin instructions. Take 1  tablet daily except 2 on Mondays or AS DIRECTED BY ANTICOAGULATION CLINIC  90 DAY) 105 tablet 1 Past Week at Unknown time    Assessment: Pharmacy consulted to dose warfarin in patient with atrial fibrillation and bioprosthetic mitral valve.  INR supratherapeutic >10 on admission. Given 2.5 mg of vitamin K  on 12/7. INR 1.5>> 1.7 Home dose listed as 4 mg on Monday and 2 mg ROW.  Goal of Therapy:  INR 2-3 per anticoag clinic Monitor platelets by anticoagulation protocol: Yes   Plan:  Warfarin 2 mg x 1 dose. Monitor daily INR and s/s of bleeding  Isac Sarna, BS [place]ena [place], BCPS Clinical Pharmacist Pager [phone] 09/05/2019,10:22 AM

## 2019-09-05 NOTE — Telephone Encounter (Signed)
Called patient and LMOVM to return call  [facility] for [facility] to Discuss results / PCP / recommendations / Schedule patient  Left a message to let patients husband know that Dr. Elease Hashimoto is aware and we are keeping them both in our prayers.  CRM Created in case husband calls back.

## 2019-09-06 ENCOUNTER — Other Ambulatory Visit: Payer: Self-pay | Admitting: Family Medicine

## 2019-09-06 ENCOUNTER — Other Ambulatory Visit: Payer: Self-pay

## 2019-09-06 DIAGNOSIS — Z20822 Contact with and (suspected) exposure to covid-19: Secondary | ICD-10-CM

## 2019-09-06 LAB — COMPREHENSIVE METABOLIC PANEL
ALT: 23 U/L (ref 0–44)
AST: 29 U/L (ref 15–41)
Albumin: 2.9 g/dL — ABNORMAL LOW (ref 3.5–5.0)
Alkaline Phosphatase: 57 U/L (ref 38–126)
Anion gap: 11 (ref 5–15)
BUN: 25 mg/dL — ABNORMAL HIGH (ref 8–23)
CO2: 26 mmol/L (ref 22–32)
Calcium: 8.8 mg/dL — ABNORMAL LOW (ref 8.9–10.3)
Chloride: 105 mmol/L (ref 98–111)
Creatinine, Ser: 0.82 mg/dL (ref 0.44–1.00)
GFR calc Af Amer: >60 mL/min (ref >60)
GFR calc non Af Amer: >60 mL/min (ref >60)
Glucose, Bld: 91 mg/dL (ref 70–99)
Potassium: 3.3 mmol/L — ABNORMAL LOW (ref 3.5–5.1)
Sodium: 142 mmol/L (ref 135–145)
Total Bilirubin: 0.7 mg/dL (ref 0.3–1.2)
Total Protein: 5.8 g/dL — ABNORMAL LOW (ref 6.5–8.1)

## 2019-09-06 LAB — PROTIME-INR
INR: 1.7 — ABNORMAL HIGH (ref 0.8–1.2)
Prothrombin Time: 20.2 seconds — ABNORMAL HIGH (ref 11.4–15.2)

## 2019-09-06 MED ORDER — SACCHAROMYCES BOULARDII 250 MG PO CAPS: 250.0000 mg | ORAL_CAPSULE | Freq: Two times a day (BID) | ORAL | 0 refills | Status: AC

## 2019-09-06 MED ORDER — PREDNISONE 20 MG PO TABS: ORAL_TABLET | ORAL | 0 refills | Status: DC

## 2019-09-06 MED ORDER — AMOXICILLIN-POT CLAVULANATE 250-62.5 MG/5ML PO SUSR: 500.0000 mg | Freq: Three times a day (TID) | ORAL | 0 refills | Status: AC

## 2019-09-06 NOTE — Plan of Care (Signed)
  Problem: Education: Goal: Knowledge of General Education information will improve Description: Including pain rating scale, medication(s)/side effects and non-pharmacologic comfort measures 09/06/2019 1308 by Melony Overly, RN Outcome: Adequate for Discharge 09/06/2019 1037 by Melony Overly, RN Outcome: Progressing   Problem: Health Behavior/Discharge Planning: Goal: Ability to manage health-related needs will improve 09/06/2019 1308 by Melony Overly, RN Outcome: Adequate for Discharge 09/06/2019 1037 by Melony Overly, RN Outcome: Progressing   Problem: Clinical Measurements: Goal: Ability to maintain clinical measurements within normal limits will improve 09/06/2019 1308 by Melony Overly, RN Outcome: Adequate for Discharge 09/06/2019 1037 by Melony Overly, RN Outcome: Progressing Goal: Will remain free from infection 09/06/2019 1308 by Melony Overly, RN Outcome: Adequate for Discharge 09/06/2019 1037 by Melony Overly, RN Outcome: Progressing Goal: Diagnostic test results will improve 09/06/2019 1308 by Melony Overly, RN Outcome: Adequate for Discharge 09/06/2019 1037 by Melony Overly, RN Outcome: Progressing Goal: Respiratory complications will improve 09/06/2019 1308 by Melony Overly, RN Outcome: Adequate for Discharge 09/06/2019 1037 by Melony Overly, RN Outcome: Progressing Goal: Cardiovascular complication will be avoided 09/06/2019 1308 by Melony Overly, RN Outcome: Adequate for Discharge 09/06/2019 1037 by Melony Overly, RN Outcome: Progressing   Problem: Activity: Goal: Risk for activity intolerance will decrease 09/06/2019 1308 by Melony Overly, RN Outcome: Adequate for Discharge 09/06/2019 1037 by Melony Overly, RN Outcome: Progressing   Problem: Nutrition: Goal: Adequate nutrition will be maintained 09/06/2019 1308 by Melony Overly, RN Outcome: Adequate for Discharge 09/06/2019 1037 by Melony Overly, RN Outcome:  Progressing   Problem: Coping: Goal: Level of anxiety will decrease 09/06/2019 1308 by Melony Overly, RN Outcome: Adequate for Discharge 09/06/2019 1037 by Melony Overly, RN Outcome: Progressing   Problem: Elimination: Goal: Will not experience complications related to bowel motility 09/06/2019 1308 by Melony Overly, RN Outcome: Adequate for Discharge 09/06/2019 1037 by Melony Overly, RN Outcome: Progressing Goal: Will not experience complications related to urinary retention 09/06/2019 1308 by Melony Overly, RN Outcome: Adequate for Discharge 09/06/2019 1037 by Melony Overly, RN Outcome: Progressing   Problem: Pain Managment: Goal: General experience of comfort will improve 09/06/2019 1308 by Melony Overly, RN Outcome: Adequate for Discharge 09/06/2019 1037 by Melony Overly, RN Outcome: Progressing   Problem: Safety: Goal: Ability to remain free from injury will improve 09/06/2019 1308 by Melony Overly, RN Outcome: Adequate for Discharge 09/06/2019 1037 by Melony Overly, RN Outcome: Progressing   Problem: Skin Integrity: Goal: Risk for impaired skin integrity will decrease 09/06/2019 1308 by Melony Overly, RN Outcome: Adequate for Discharge 09/06/2019 1037 by Melony Overly, RN Outcome: Progressing

## 2019-09-06 NOTE — Plan of Care (Signed)

## 2019-09-06 NOTE — Discharge Summary (Signed)
Physician Discharge Summary  Catherine [name] [id number] DOB: 05-03-1935 DOA: 09/03/2019  PCP: Catherine Post, MD  Admit date: 09/03/2019 Discharge date: 09/06/2019  Time spent: 35 minutes  Recommendations for Outpatient Follow-up:  1. Repeat CBC to follow hemoglobin trend 2. -Close monitoring of patient Coumadin level 3. -Repeat basic metabolic panel to follow electrolytes and renal function 4. -Reassess blood pressure and further adjust antihypertensive regimen as required.   Discharge Diagnoses:  Active Problems:   Enteritis   Dehydration   Gastroenteritis   Protein-calorie malnutrition, severe COVID 19 infection  Discharge Condition: Stable and improved.  Patient discharged home with instruction to follow-up with PCP in 10 days.  Diet recommendation: Soft heart healthy diet.  Filed Weights   09/05/19 0517  Weight: 65.5 kg    History of present illness:  84 year old female with a history of atrial fibrillation on warfarin, hypertension, mitral valve replacement as part of MAZE procedure, aortic stenosis, coronary disease with CABG, and diastolic CHF presenting with nausea, vomiting, and diarrhea.  The patient stated that she had not been feeling well for at least the past 2 weeks.  She was tested to be Covid positive at an outside facility on 08/22/2019.  The patient had an ED visit on 09/02/2019.  CT of the abdomen and pelvis at that time showed wall thickening of the jejunum with mucosal hyperemia and adjacent fat stranding.  The patient was treated symptomatically and discharged home in stable condition.  She returned to the ED on 09/03/2019 with persistent symptoms as above with abdominal pain and progressive weakness.  Patient stated that she was having 3-4 loose bowel movements on a daily basis for the past 2 to 3 days as well as emesis.  Patient also had coughing with subjective fevers and chills and myalgias.  She had denied any shortness of breath, chest pain,  hematochezia, melena, dysuria, hematuria. In the emergency department, the patient was noted to have WBC 19.5 with mild hypoxia of 89% on room air.  INR was noted to be > 10 without any overt bleeding.  EKG showed atrial fibrillation at 75 bpm.  Patient was given vitamin K and started on IV fluids and IV Zosyn.  She was placed on nasal cannula oxygen with saturation up to 98% on 2 L.  Hospital Course:  COVID-19 enteritis -Stool pathogen panel was ordered, but the patient has not had any watery diarrhea since arrival to the medical floor. -no nausea or vomiting at time of discharge. -She seen significantly improved and ready for discharge from a GI standpoint -will complete empirical treatment with augmentin. -Diet gradually advanced and tolerated prior discharge.  Acute respiratory failure with hypoxia -In the setting of COVID-19 pneumonia -Continue steroids tapering. -patient received 3 days of remdesivir. -Weaned off and maintain on room air  -CRP 4.7>>> 4.2 -LDH 239 -Ferritin 110>>> 182  COVID-19 pneumonia -Management as discussed above  CKD stage 3 -baseline creatinine 0.8-0.9 -presented with serum creatinine 1.13 -Renal function stable and back to baseline.  Supratherapeutic INR secondary to Coumadin use -Coumadin place on hold initially. -No overt bleeding identified. -Vitamin K given in ED. -now INR building up again. -Continue Coumadin dose per pharmacy.  Chronic atrial fibrillation -restart coumadin for secondary prevention -Continue follow-up with Coumadin clinic.  Bioprosthetic mitral valve -part of MAZE procedure -Stable.  CAD/CABG -Continue aspirin and statin -no chest pain or SOB -Continue outpatient follow-up with cardiology service.  Chronic Diastolic CHF -Currently stable and euvolemic. -Continue tofollow daily weights. -Last 2D  echocardiogram 06/2018 with LVEF 65-70% -Strict I's and O's low-sodium diet has been discussed with  patient. -Safe to resume oral Lasix as previously instructed. Continue outpatient follow-up with cardiology service.    Hypertension-controlled -Continue Cardura -continue to Hold triamterene/HCTZ at time of discharge -Safe to resume the use of Lasix. -Patient advised to follow heart healthy diet.  Severe protein calorie malnutrition -in the setting of acute illness and inability to tolerate PO's prior to admission. -Patient tolerating diet without problems prior to discharge. -Instructed to maintain adequate hydration, nutrition and continue the use of over-the-counter feeding supplements.  Procedures:  See below for x-ray reports  Consultations:  None  Discharge Exam: Vitals:   09/06/19 0553 09/06/19 0910  BP: (!) 129/56   Pulse: (!) 52   Resp: 18   Temp: 98.7 F (37.1 C)   SpO2: 97% 97%    General: Alert, awake and oriented x3; no fever, no chest pain, no nausea or further vomiting.  Patient reports no diarrhea or abdominal pain.  Tolerating diet and not requiring any oxygen supplementation. Cardiovascular: RRR, no murmurs, no rubs, no gallops, no JVD. Respiratory: Good air movement bilaterally, positive scattered rhonchi, no using accessory muscles, no wheezing, good oxygen saturation on room air. Abdomen: Soft, nontender, nondistended, positive bowel sounds Extremities: No cyanosis, no clubbing.  Discharge Instructions   Discharge Instructions    Diet - low sodium heart healthy   Complete by: As directed    Discharge instructions   Complete by: As directed    Take medications as prescribed Keep yourself well-hydrated Arrange follow-up with PCP in 10 days     Allergies as of 09/06/2019      Reactions   Aldactone [spironolactone] Other (See Comments)   dyspnea   Amoxicillin Palpitations   Tachycardia Has patient had a PCN reaction causing immediate rash, facial/tongue/throat swelling, SOB or lightheadedness with hypotension: no Has patient had a PCN  reaction causing severe rash involving mucus membranes or skin necrosis: {no Has patient had a PCN reaction that required hospitalization {no Has patient had a PCN reaction occurring within the last 10 years: {yes If all of the above answers are "NO", then may proceed with Cephalosporin use.   Diltiazem Other (See Comments)   Causing headaches   Flagyl [metronidazole Hcl] Other (See Comments)   Causing headaches   Flovent [fluticasone Propionate] Other (See Comments)   Leg cramps   Gabapentin Swelling   Lyrica [pregabalin] Swelling   Quinidine Diarrhea, Other (See Comments)   Fever diarrhea   Simvastatin Other (See Comments)   Leg pain, myalgia   Tramadol Nausea Only   Verapamil Other (See Comments)   myalgias   Amlodipine    Low extremity edema   Zetia [ezetimibe]    LEG CRAMPS   Ace Inhibitors Other (See Comments)   unknown   Benazepril Hcl Cough   Ciprocin-fluocin-procin [fluocinolone Acetonide] Other (See Comments)   unknown   Ciprofloxacin Diarrhea   Codeine Nausea Only   Nitrofurantoin Monohyd Macro Nausea Only      Medication List    STOP taking these medications   triamterene-hydrochlorothiazide 37.5-25 MG tablet Commonly known as: MAXZIDE-25     TAKE these medications   acetaminophen 500 MG tablet Commonly known as: TYLENOL Take 500 mg by mouth daily.   albuterol 108 (90 Base) MCG/ACT inhaler Commonly known as: VENTOLIN HFA Inhale 2 puffs into the lungs every 6 (six) hours as needed for wheezing or shortness of breath.   amoxicillin-clavulanate 250-62.5 MG/5ML suspension  Commonly known as: AUGMENTIN Take 10 mLs (500 mg total) by mouth 3 (three) times daily for 8 days.   aspirin 81 MG EC tablet Take 1 tablet (81 mg total) by mouth daily.   benzonatate 100 MG capsule Commonly known as: TESSALON Take 1 capsule (100 mg total) by mouth 3 (three) times daily as needed for cough.   budesonide-formoterol 80-4.5 MCG/ACT inhaler Commonly known as:  Symbicort 2 PUFFS 2 TIMES A DAY What changed:   how much to take  how to take this  when to take this  additional instructions   diazepam 5 MG tablet Commonly known as: VALIUM TAKE ONE TABLET AT BEDTIME   doxazosin 4 MG tablet Commonly known as: CARDURA TAKE 1 TABLET DAILY. STOP LOSARTAN What changed: See the new instructions.   furosemide 40 MG tablet Commonly known as: LASIX TAKE 1 TABLET DAILY. MAY TAKE 1 EXTRA AS NEEDED FOR SWELLING What changed: See the new instructions.   HAIR/SKIN/NAILS/BIOTIN PO Take 1 tablet by mouth daily.   mupirocin ointment 2 % Commonly known as: BACTROBAN Place 1 application into the nose 2 (two) times daily as needed (for affected areas below knee).   ondansetron 4 MG disintegrating tablet Commonly known as: Zofran ODT Take 1 tablet (4 mg total) by mouth every 8 (eight) hours as needed for nausea or vomiting.   OVER THE COUNTER MEDICATION Apply 1 application topically at bedtime as needed (Leg Pain). Magni-Life topical cream for leg pain.   potassium chloride 10 MEQ tablet Commonly known as: KLOR-CON If taking 1 lasix tab- Take 2 tablets by mouth twice a day.  If 2 tabs of lasix, then take 5 tabs of potassium What changed:   how much to take  how to take this  when to take this   predniSONE 20 MG tablet Commonly known as: Deltasone Take 3 tablets by mouth daily x2 days; then 2 tablets by mouth daily x2 days; then 1 tablet by mouth daily x3 days; take half tablet by mouth daily x3 days and stop prednisone.   Promethegan 12.5 MG suppository Generic drug: promethazine Place 12.5 mg rectally every 8 (eight) hours as needed for nausea or vomiting.   promethazine 25 MG tablet Commonly known as: PHENERGAN Take 0.5 tablets (12.5 mg total) by mouth every 6 (six) hours as needed for nausea or vomiting.   rosuvastatin 5 MG tablet Commonly known as: CRESTOR Take 2.5mg  every Monday and Friday for 2 weeks, then increase to 5mg  every  Monday and Friday if able to tolerate. What changed:   how much to take  how to take this  when to take this  additional instructions   saccharomyces boulardii 250 MG capsule Commonly known as: Florastor Take 1 capsule (250 mg total) by mouth 2 (two) times daily.   warfarin 2 MG tablet Commonly known as: COUMADIN Take as directed. If you are unsure how to take this medication, talk to your nurse or doctor. Original instructions: Take 1 tablet daily except 2 on Mondays or AS DIRECTED BY ANTICOAGULATION CLINIC  90 DAY What changed:   how much to take  how to take this  when to take this      Allergies  Allergen Reactions  . Aldactone [Spironolactone] Other (See Comments)    dyspnea  . Amoxicillin Palpitations    Tachycardia Has patient had a PCN reaction causing immediate rash, facial/tongue/throat swelling, SOB or lightheadedness with hypotension: no Has patient had a PCN reaction causing severe rash involving mucus  membranes or skin necrosis: {no Has patient had a PCN reaction that required hospitalization {no Has patient had a PCN reaction occurring within the last 10 years: {yes If all of the above answers are "NO", then may proceed with Cephalosporin use.  . Diltiazem Other (See Comments)    Causing headaches   . Flagyl [Metronidazole Hcl] Other (See Comments)    Causing headaches    . Flovent [Fluticasone Propionate] Other (See Comments)    Leg cramps  . Gabapentin Swelling  . Lyrica [Pregabalin] Swelling  . Quinidine Diarrhea and Other (See Comments)    Fever diarrhea  . Simvastatin Other (See Comments)    Leg pain, myalgia  . Tramadol Nausea Only  . Verapamil Other (See Comments)    myalgias  . Amlodipine     Low extremity edema  . Zetia [Ezetimibe]     LEG CRAMPS   . Ace Inhibitors Other (See Comments)    unknown  . Benazepril Hcl Cough  . Ciprocin-Fluocin-Procin [Fluocinolone Acetonide] Other (See Comments)    unknown  . Ciprofloxacin  Diarrhea  . Codeine Nausea Only  . Nitrofurantoin Monohyd Macro Nausea Only   Follow-up Information    Catherine Post, MD. Schedule an appointment as soon as possible for a visit in 10 day(s).   Specialty: Family Medicine Contact information: [place] [address] [phone]        [name], MD .   Specialty: Cardiology Contact information: [address] [place] [address] [phone]           The results of significant diagnostics from this hospitalization (including imaging, microbiology, ancillary and laboratory) are listed below for reference.    Significant Diagnostic Studies: CT ABDOMEN PELVIS W CONTRAST  Result Date: 09/02/2019 CLINICAL DATA:  Initial evaluation for acute nausea, vomiting. EXAM: CT ABDOMEN AND PELVIS WITH CONTRAST TECHNIQUE: Multidetector CT imaging of the abdomen and pelvis was performed using the standard protocol following bolus administration of intravenous contrast. CONTRAST:  66mL OMNIPAQUE IOHEXOL 300 MG/ML  SOLN COMPARISON:  None available. FINDINGS: Lower chest: Scattered subpleural reticular and ground-glass opacities seen within the bilateral lung bases, suspected reflect fibrosis and/or scarring. Visualized lungs are otherwise clear. Prominent coronary artery calcifications. Prosthetic mitral valve noted. No pleural pericardial effusion. Hepatobiliary: Liver demonstrates a normal contrast enhanced appearance. Focal fat deposition noted a sent to the falciform ligament. Gallbladder surgically absent. No biliary dilatation. Pancreas: Pancreas within normal limits. Spleen: Spleen within normal limits. Adrenals/Urinary Tract: Adrenal glands are normal. Kidneys equal in size with symmetric enhancement. Few scattered subcentimeter hypodensities noted, too small the characterize, but statistically likely reflects small cyst. No focal enhancing renal mass. Punctate nonobstructive left renal  nephrolithiasis. No hydronephrosis. No hydroureter or visible ureterolithiasis. Bladder largely decompressed without acute abnormality. Stomach/Bowel: Stomach largely decompressed without acute abnormality. Few scattered loops of prominent jejunum seen clustered within the mid abdomen demonstrate prominent wall thickening and mucosal hyperemia, suggesting acute enteritis. Adjacent hazy inflammatory fat stranding and small volume free fluid. No evidence for associated obstruction, perforation, or other complication. No pneumatosis or portal venous gas. Small bowel decompressed distally. Appendix not definitely visualized.: Of normal caliber without acute inflammatory changes. Moderate retained stool within the distal colon, which could reflect constipation. Vascular/Lymphatic: Moderate aorto bi-iliac atherosclerotic disease. No aneurysm. Mesenteric vessels patent proximally. No adenopathy. Reproductive: Uterus is absent. Ovaries not confidently identified. No adnexal mass. Other: Small volume free fluid adjacent to the spleen and along the left pericolic gutter as  well as within the pelvis, likely reactive. No free intraperitoneal air. Small fat containing paraumbilical hernia noted without associated inflammation. Musculoskeletal: External soft tissues demonstrate no acute finding. No acute osseous abnormality. No discrete lytic or blastic osseous lesions. Prior PLIF at L5-S1 noted. IMPRESSION: 1. Prominent wall thickening with mucosal enhancement involving a few scattered loops of jejunum within the mid abdomen, suggesting acute enteritis. No evidence for associated obstruction, perforation, or other complication. 2. Small volume free fluid within the abdomen and pelvis, likely reactive. 3. Moderate retained stool within the distal colon, which could reflect constipation. 4. Punctate nonobstructive left renal nephrolithiasis. 5. Moderate aorto bi-iliac atherosclerotic disease. Electronically Signed   By: Jeannine Boga M.D.   On: 09/02/2019 04:28   DG CHEST PORT 1 VIEW  Result Date: 09/03/2019 CLINICAL DATA:  Shortness of breath in a patient who is COVID-19 positive. EXAM: PORTABLE CHEST 1 VIEW COMPARISON:  Single-view of the chest 08/29/2019. PA and lateral chest 10/30/2018. FINDINGS: Mild bibasilar atelectasis is present. No consolidative process, pneumothorax or effusion. Heart size is upper normal. The patient is status [name] CABG. Atherosclerosis noted. IMPRESSION: No acute disease. Atherosclerosis. Electronically Signed   By: Inge Rise M.D.   On: 09/03/2019 11:19   DG Chest Portable 1 View  Result Date: 08/29/2019 CLINICAL DATA:  Shortness of breath EXAM: PORTABLE CHEST 1 VIEW COMPARISON:  October 30, 2018 FINDINGS: Small left pleural effusion and adjacent atelectasis. No pneumothorax. Normal heart size with evidence of prior CABG and valve replacement. Cervicothoracic fusion hardware is noted IMPRESSION: Small left pleural effusion and adjacent atelectasis. Electronically Signed   By: Macy Mis M.D.   On: 08/29/2019 11:47    Microbiology: Recent Results (from the past 240 hour(s))  Urine culture     Status: None   Collection Time: 09/03/19  6:11 AM   Specimen: Urine, Random  Result Value Ref Range Status   Specimen Description   Final    URINE, RANDOM Performed at [facility], [address], [place], [place] [address]    Special Requests   Final    NONE Performed at [facility], [address] [place], [place] [address]    Culture   Final    NO GROWTH Performed at [facility], [place] [address], [place], [place] [address]    Report Status 09/04/2019 FINAL  Final  Blood culture (routine x 2)     Status: None (Preliminary result)   Collection Time: 09/03/19  6:46 AM   Specimen: BLOOD RIGHT ARM  Result Value Ref Range Status   Specimen Description BLOOD RIGHT ARM  Final   Special Requests   Final    BOTTLES DRAWN AEROBIC AND ANAEROBIC Blood Culture adequate volume    Culture   Final    NO GROWTH 3 DAYS Performed at [facility], [address], [place], [place] [address]    Report Status PENDING  Incomplete  Blood culture (routine x 2)     Status: None (Preliminary result)   Collection Time: 09/03/19  6:49 AM   Specimen: BLOOD RIGHT HAND  Result Value Ref Range Status   Specimen Description BLOOD RIGHT HAND  Final   Special Requests   Final    BOTTLES DRAWN AEROBIC ONLY Blood Culture results may not be optimal due to an inadequate volume of blood received in culture bottles   Culture   Final    NO GROWTH 3 DAYS Performed at [facility], [address], [place], [place] [address]  Report Status PENDING  Incomplete     Labs: Basic Metabolic Panel: Recent Labs  Lab 09/02/19 0156 09/03/19 0440 09/04/19 0452 09/05/19 0505 09/06/19 0515  NA 138 141 141 141 142  K 3.1* 3.8 3.3* 3.4* 3.3*  CL 102 101 104 103 105  CO2 25 26 27 29 26   GLUCOSE 156* 154* 130* 106* 91  BUN 16 26* 23 25* 25*  CREATININE 0.86 1.13* 0.90 0.93 0.82  CALCIUM 8.8* 9.1 8.8* 8.8* 8.8*  MG  --   --  2.4 2.6*  --    Liver Function Tests: Recent Labs  Lab 09/02/19 0156 09/03/19 0440 09/04/19 0452 09/05/19 0505 09/06/19 0515  AST 30 23 20 21 29   ALT 26 20 15 15 23   ALKPHOS 88 82 69 59 57  BILITOT 0.8 0.9 0.8 0.6 0.7  PROT 7.0 6.9 6.1* 5.7* 5.8*  ALBUMIN 3.7 3.4* 3.0* 2.8* 2.9*   Recent Labs  Lab 09/02/19 0156 09/03/19 0440  LIPASE 30 22   CBC: Recent Labs  Lab 09/02/19 0156 09/03/19 0440 09/04/19 0452 09/05/19 0505  WBC 17.8* 19.5* 12.2* 12.2*  NEUTROABS 15.5* 16.5*  --   --   HGB 13.2 11.5* 9.2* 8.3*  HCT 39.0 34.4* 27.9* 25.2*  MCV 87.8 91.0 90.3 90.3  PLT 300 300 242 227   CBG: Recent Labs  Lab 09/05/19 2058  GLUCAP 139*    Signed:  Barton Dubois MD.  [facility] 09/06/2019, 11:26 AM

## 2019-09-07 ENCOUNTER — Telehealth: Payer: Self-pay

## 2019-09-07 ENCOUNTER — Other Ambulatory Visit: Payer: Self-pay

## 2019-09-07 LAB — NOVEL CORONAVIRUS, NAA: SARS-CoV-2, NAA: NOT DETECTED

## 2019-09-07 MED ORDER — FLUCONAZOLE 150 MG PO TABS: 150.0000 mg | ORAL_TABLET | Freq: Once | ORAL | 0 refills | Status: AC

## 2019-09-07 NOTE — Telephone Encounter (Signed)
Transition Care Management Follow-up Telephone Call  Date of discharge and from where: 09/06/2019 from [facility]  How have you been since you were released from the hospital? "very weak and tired". Any questions or concerns? Yes  discharge summary reviewed and patient asked for diflucan due to taking Augmentin. Items Reviewed:  Did the pt receive and understand the discharge instructions provided? Yes   Medications obtained and verified? Yes   Any new allergies since your discharge? No   Dietary orders reviewed? Yes  Do you have support at home? Yes   Other (ie: DME, Home Health, etc) no   Functional Questionnaire: (I = Independent and D = Dependent) ADL's: Patient is able to take care of herself minimally but has husband at home to help her. She states she is very weak, tired, short of breath, and continuing to have diarrhea. Patient is also asking for home health nurse again.   Bathing/Dressing-    Meal Prep-   Eating-   Maintaining continence-   Transferring/Ambulation-   Managing Meds-    Follow up appointments reviewed:    PCP Hospital f/u appt confirmed? Yes    Specialist Hospital f/u appt confirmed? Yes  N/A  Are transportation arrangements needed? No   If their condition worsens, is the pt aware to call  their PCP or go to the ED? Yes  Was the patient provided with contact information for the PCP's office or ED? Yes  Was the pt encouraged to call back with questions or concerns? Yes

## 2019-09-08 LAB — CULTURE, BLOOD (ROUTINE X 2)
Culture: NO GROWTH
Culture: NO GROWTH
Special Requests: ADEQUATE

## 2019-09-10 ENCOUNTER — Telehealth (INDEPENDENT_AMBULATORY_CARE_PROVIDER_SITE_OTHER): Payer: Medicare Other | Admitting: Family Medicine

## 2019-09-10 ENCOUNTER — Telehealth: Payer: Self-pay | Admitting: Family Medicine

## 2019-09-10 ENCOUNTER — Other Ambulatory Visit: Payer: Self-pay

## 2019-09-10 ENCOUNTER — Telehealth: Payer: Self-pay | Admitting: *Deleted

## 2019-09-10 ENCOUNTER — Ambulatory Visit: Payer: Medicare Other

## 2019-09-10 DIAGNOSIS — U071 COVID-19: Secondary | ICD-10-CM

## 2019-09-10 DIAGNOSIS — R531 Weakness: Secondary | ICD-10-CM | POA: Diagnosis not present

## 2019-09-10 DIAGNOSIS — K529 Noninfective gastroenteritis and colitis, unspecified: Secondary | ICD-10-CM | POA: Diagnosis not present

## 2019-09-10 NOTE — Progress Notes (Signed)
This visit type was conducted due to national recommendations for restrictions regarding the COVID-19 pandemic in an effort to limit this patient's exposure and mitigate transmission in our community.   Virtual Visit via Telephone Note  I connected with Catherine [name] on 09/10/19 at  4:15 PM EST by telephone and verified that I am speaking with the correct person using two identifiers.   I discussed the limitations, risks, security and privacy concerns of performing an evaluation and management service by telephone and the availability of in person appointments. I also discussed with the patient that there may be a patient responsible charge related to this service. The patient expressed understanding and agreed to proceed.  Location patient: home Location provider: work or home office Participants present for the call: patient, provider Patient did not have a visit in the prior 7 days to address this/these issue(s).   History of Present Illness:  Recent hospitalization for [id number] complications.  She has multiple chronic medical problems and had presented with recurrent nausea, vomiting, and diarrhea.  She had been feeling poorly for a few weeks.  Her husband initially tested positive for Covid and then she tested positive on 25 November.  She had had 3 ED visits before her admission.  She had CT of the abdomen and pelvis on the sixth which showed some wall thickening of the jejunum.  She was treated symptomatically and discharged home.  She then returned the next day with persistent symptoms and progressive abdominal pain and weakness.  She was having frequent loose bowel movements and some vomiting.  Also having some cough and subjective fevers.  White count 19.5 thousand with hypoxia with 89% room air oxygen.  INR greater than 10.  No overt bleeding issues.  She was given vitamin K, IV fluids, and IV Zosyn and placed on oxygen.  She gradually improved from a GI standpoint.  She had acute  respiratory failure with hypoxia in the setting of COVID-19 pneumonia.  She was tapered off steroids and received 3 days of remdesivir.  Renal function was back to baseline at discharge.  Overall improving.  Still has some generalized weakness and fatigue but she feels she is growing stronger each day.  Her Maxide was held during hospitalization because of low blood pressures.  She has not monitored since being home.  Pulse oximetry today 95% on room air.  No recurrent fever.  Stools are becoming more formed  Past Medical History:  Diagnosis Date  . Aortic stenosis, mild 07/26/2017  . Arthritis   . Asthma    last attack 02/2015  . Atrial fibrillation, chronic ([place])   . Cellulitis of left lower extremity 08/17/2016   Ulcer associated with severe venous insufficiency  . Chronic anticoagulation   . Chronic diastolic CHF (congestive heart failure) ([place])   . Chronic kidney disease    "RIGHT MANY KIDNEY INFECTIONS AND STONES"  . COPD (chronic obstructive pulmonary disease) ([place])   . Coronary artery disease   . Dizziness   . H/O: rheumatic fever   . Heart murmur   . Hypertension   . PONV (postoperative nausea and vomiting)    ' SOMETIMES', BUT NOT ALWAYS"  . S/P Maze operation for atrial fibrillation 10/14/2016   Complete bilateral atrial lesion set using cryothermy and bipolar radiofrequency ablation - atrial appendage was not treated due to previous surgical procedure (open mitral commissurotomy)  . S/P mitral valve replacement with bioprosthetic valve 10/14/2016   29 mm Medtronic Mosaic porcine bioprosthetic tissue valve  .  UTI (urinary tract infection)   . Valvular heart disease    Has mitral stenosis with prior mitral commissurotomy in 1970   Past Surgical History:  Procedure Laterality Date  . ABDOMINAL HYSTERECTOMY  1983   endometriosis  . APPENDECTOMY    . BACK SURGERY     neurosurgery x2  . CARDIAC CATHETERIZATION    . CARDIAC CATHETERIZATION N/A 08/03/2016   Procedure:  Right/Left Heart Cath and Coronary Angiography;  Surgeon: Peter M Martinique, MD;  Location: [facility];  Service: Cardiovascular;  Laterality: N/A;  . cataract surg    . CHOLECYSTECTOMY N/A 05/30/2015   Procedure: LAPAROSCOPIC CHOLECYSTECTOMY WITH INTRAOPERATIVE CHOLANGIOGRAM;  Surgeon: Excell Seltzer, MD;  Location: [facility];  Service: General;  Laterality: N/A;  . COLONOSCOPY    . CORONARY ARTERY BYPASS GRAFT N/A 10/14/2016   Procedure: CORONARY ARTERY BYPASS GRAFTING (CABG);  Surgeon: Rexene Alberts, MD;  Location: [facility];  Service: Open Heart Surgery;  Laterality: N/A;  . EYE SURGERY    . MAZE N/A 10/14/2016   Procedure: MAZE;  Surgeon: Rexene Alberts, MD;  Location: [facility];  Service: Open Heart Surgery;  Laterality: N/A;  . MITRAL VALVE REPLACEMENT N/A 10/14/2016   Procedure: REDO MITRAL VALVE REPLACEMENT (MVR);  Surgeon: Rexene Alberts, MD;  Location: [facility];  Service: Open Heart Surgery;  Laterality: N/A;  . MITRAL VALVE SURGERY Left 1970   Open mitral commissurotomy via left thoracotomy approach  . TEE WITHOUT CARDIOVERSION N/A 07/05/2016   Procedure: TRANSESOPHAGEAL ECHOCARDIOGRAM (TEE);  Surgeon: [name], MD;  Location: [facility];  Service: Cardiovascular;  Laterality: N/A;  . TEE WITHOUT CARDIOVERSION N/A 10/14/2016   Procedure: TRANSESOPHAGEAL ECHOCARDIOGRAM (TEE);  Surgeon: Rexene Alberts, MD;  Location: [facility];  Service: Open Heart Surgery;  Laterality: N/A;    reports that she quit smoking about 43 years ago. Her smoking use included cigarettes. She has a 15.00 pack-year smoking history. She has never used smokeless tobacco. She reports that she does not drink alcohol or use drugs. family history includes Leukemia in her father. Allergies  Allergen Reactions  . Aldactone [Spironolactone] Other (See Comments)    dyspnea  . Amoxicillin Palpitations    Tachycardia Has patient had a PCN reaction causing immediate rash, facial/tongue/throat swelling, SOB or  lightheadedness with hypotension: no Has patient had a PCN reaction causing severe rash involving mucus membranes or skin necrosis: {no Has patient had a PCN reaction that required hospitalization {no Has patient had a PCN reaction occurring within the last 10 years: {yes If all of the above answers are "NO", then may proceed with Cephalosporin use.  . Diltiazem Other (See Comments)    Causing headaches   . Flagyl [Metronidazole Hcl] Other (See Comments)    Causing headaches    . Flovent [Fluticasone Propionate] Other (See Comments)    Leg cramps  . Gabapentin Swelling  . Lyrica [Pregabalin] Swelling  . Quinidine Diarrhea and Other (See Comments)    Fever diarrhea  . Simvastatin Other (See Comments)    Leg pain, myalgia  . Tramadol Nausea Only  . Verapamil Other (See Comments)    myalgias  . Amlodipine     Low extremity edema  . Zetia [Ezetimibe]     LEG CRAMPS   . Ace Inhibitors Other (See Comments)    unknown  . Benazepril Hcl Cough  . Ciprocin-Fluocin-Procin [Fluocinolone Acetonide] Other (See Comments)    unknown  . Ciprofloxacin Diarrhea  . Codeine Nausea Only  .  Nitrofurantoin Monohyd Macro Nausea Only      Observations/Objective: Patient sounds cheerful and well on the phone. I do not appreciate any SOB. Speech and thought processing are grossly intact. Patient reported vitals:  Assessment and Plan: Recent COVID-19 infection presenting with enteritis and Covid pneumonia.  She is improving from a respiratory and GI standpoint this time.  She had low blood pressures and was taken off Maxide.  We discussed the following issues  -Continue close home monitoring of oxygen levels and also recommend she get some blood pressure readings.  If staying below 140/90 range continue to hold Maxide at this time -We recommended an office follow-up on the 28th and will get some follow-up lab work at that time.  Follow-up sooner as needed  Follow Up Instructions:  -December  28   [id number] 5-10 [id number] 11-20 [id number] 21-30 I did not refer this patient for an OV in the next 24 hours for this/these issue(s).  I discussed the assessment and treatment plan with the patient. The patient was provided an opportunity to ask questions and all were answered. The patient agreed with the plan and demonstrated an understanding of the instructions.   The patient was advised to call back or seek an in-person evaluation if the symptoms worsen or if the condition fails to improve as anticipated.  I provided 25 minutes of non-face-to-face time during this encounter.   [name], MD

## 2019-09-10 NOTE — Telephone Encounter (Signed)
Reviewed negative Covid results with the patient. No further questions.

## 2019-09-10 NOTE — Telephone Encounter (Signed)
The patient was just released from the Hospital and is Asymptomatic and you rescheduled her coumadin to 12/28. The patient was wanting to know what she needs to do about her blood being so thin.  Please Advise

## 2019-09-19 ENCOUNTER — Telehealth: Payer: Self-pay | Admitting: Internal Medicine

## 2019-09-19 NOTE — Telephone Encounter (Signed)
Called and spoke with pt letting her know that we did not have any samples of symbicort and were unsure if we were going to be getting any more in. Pt verbalized understanding. Nothing further needed.

## 2019-09-24 ENCOUNTER — Other Ambulatory Visit: Payer: Self-pay

## 2019-09-24 ENCOUNTER — Ambulatory Visit (INDEPENDENT_AMBULATORY_CARE_PROVIDER_SITE_OTHER): Payer: Medicare Other | Admitting: General Practice

## 2019-09-24 ENCOUNTER — Ambulatory Visit (INDEPENDENT_AMBULATORY_CARE_PROVIDER_SITE_OTHER): Payer: Medicare Other | Admitting: Family Medicine

## 2019-09-24 ENCOUNTER — Encounter: Payer: Self-pay | Admitting: Family Medicine

## 2019-09-24 ENCOUNTER — Telehealth: Payer: Self-pay

## 2019-09-24 VITALS — BP 128/72 | HR 75 | Temp 98.0°F | Ht 63.0 in | Wt 147.0 lb

## 2019-09-24 DIAGNOSIS — Z8619 Personal history of other infectious and parasitic diseases: Secondary | ICD-10-CM | POA: Diagnosis not present

## 2019-09-24 DIAGNOSIS — D649 Anemia, unspecified: Secondary | ICD-10-CM | POA: Diagnosis not present

## 2019-09-24 DIAGNOSIS — R531 Weakness: Secondary | ICD-10-CM | POA: Diagnosis not present

## 2019-09-24 DIAGNOSIS — L03116 Cellulitis of left lower limb: Secondary | ICD-10-CM | POA: Diagnosis not present

## 2019-09-24 DIAGNOSIS — Z7901 Long term (current) use of anticoagulants: Secondary | ICD-10-CM | POA: Diagnosis not present

## 2019-09-24 DIAGNOSIS — Z8616 Personal history of COVID-19: Secondary | ICD-10-CM

## 2019-09-24 DIAGNOSIS — E785 Hyperlipidemia, unspecified: Secondary | ICD-10-CM | POA: Diagnosis not present

## 2019-09-24 LAB — HEPATIC FUNCTION PANEL
ALT: 22 U/L (ref 0–35)
AST: 23 U/L (ref 0–37)
Albumin: 3.9 g/dL (ref 3.5–5.2)
Alkaline Phosphatase: 94 U/L (ref 39–117)
Bilirubin, Direct: 0.2 mg/dL (ref 0.0–0.3)
Total Bilirubin: 0.9 mg/dL (ref 0.2–1.2)
Total Protein: 6 g/dL (ref 6.0–8.3)

## 2019-09-24 LAB — CBC WITH DIFFERENTIAL/PLATELET
Basophils Absolute: 0 10*3/uL (ref 0.0–0.1)
Basophils Relative: 0.7 % (ref 0.0–3.0)
Eosinophils Absolute: 0.1 10*3/uL (ref 0.0–0.7)
Eosinophils Relative: 1.8 % (ref 0.0–5.0)
HCT: 32.4 % — ABNORMAL LOW (ref 36.0–46.0)
Hemoglobin: 11 g/dL — ABNORMAL LOW (ref 12.0–15.0)
Lymphocytes Relative: 21.5 % (ref 12.0–46.0)
Lymphs Abs: 1.3 10*3/uL (ref 0.7–4.0)
MCHC: 34.1 g/dL (ref 30.0–36.0)
MCV: 92 fl (ref 78.0–100.0)
Monocytes Absolute: 0.4 10*3/uL (ref 0.1–1.0)
Monocytes Relative: 6.4 % (ref 3.0–12.0)
Neutro Abs: 4.4 10*3/uL (ref 1.4–7.7)
Neutrophils Relative %: 69.6 % (ref 43.0–77.0)
Platelets: 133 10*3/uL — ABNORMAL LOW (ref 150.0–400.0)
RBC: 3.52 Mil/uL — ABNORMAL LOW (ref 3.87–5.11)
RDW: 17.5 % — ABNORMAL HIGH (ref 11.5–15.5)
WBC: 6.3 10*3/uL (ref 4.0–10.5)

## 2019-09-24 LAB — BASIC METABOLIC PANEL
BUN: 10 mg/dL (ref 6–23)
CO2: 30 mEq/L (ref 19–32)
Calcium: 8.6 mg/dL (ref 8.4–10.5)
Chloride: 105 mEq/L (ref 96–112)
Creatinine, Ser: 0.77 mg/dL (ref 0.40–1.20)
GFR: 71.73 mL/min (ref >60.00)
Glucose, Bld: 90 mg/dL (ref 70–99)
Potassium: 2.7 mEq/L — CL (ref 3.5–5.1)
Sodium: 143 mEq/L (ref 135–145)

## 2019-09-24 LAB — LIPID PANEL
Cholesterol: 163 mg/dL (ref 0–200)
HDL: 52.7 mg/dL (ref >39.00)
NonHDL: 110.61
Total CHOL/HDL Ratio: 3
Triglycerides: 214 mg/dL — ABNORMAL HIGH (ref 0.0–149.0)
VLDL: 42.8 mg/dL — ABNORMAL HIGH (ref 0.0–40.0)

## 2019-09-24 LAB — LDL CHOLESTEROL, DIRECT: Direct LDL: 68 mg/dL

## 2019-09-24 LAB — POCT INR: INR: 5.7 — AB (ref 2.0–3.0)

## 2019-09-24 MED ORDER — FLUCONAZOLE 150 MG PO TABS: 150.0000 mg | ORAL_TABLET | Freq: Once | ORAL | 0 refills | Status: AC

## 2019-09-24 MED ORDER — CEPHALEXIN 500 MG PO CAPS: 500.0000 mg | ORAL_CAPSULE | Freq: Four times a day (QID) | ORAL | 0 refills | Status: DC

## 2019-09-24 NOTE — Telephone Encounter (Signed)
Called patient and gave her the lab results from today. Patient verbalized an understanding.

## 2019-09-24 NOTE — Progress Notes (Signed)
Subjective:     Patient ID: Catherine [name], female   DOB: 1934-10-10, 84 y.o.   MRN: [id number]  HPI Catherine [name] is here for several issues as follows.  She was admitted from 7 December to the 10th with severe weakness and enteritis symptoms which had persisted for several days in setting of positive Covid 19 infection..  She had a couple ER visits prior to this admission.  She had a white count on admission of 19,000 and was given IV fluids.  Her oxygen levels were 89% on admission.  Her diarrhea promptly improved after admission.  She had INR over 10 and was given vitamin K.  She was treated with remdesivir for Covid 19 infection.  Since discharge she has had profound weakness.  She had 1 fall of a week ago and has had some bilateral rib cage pain since then but no other specific injuries.  No head injury.  She denies orthostatic symptoms.  Her hemoglobin went from 11.5 to 8.3 prior to discharge.  Maxide was held and blood pressures been very stable at home since discharge off the [facility]  New problem is left leg pain and increased redness and heat this was started 23 December.  Denies any recent leg injury.  Past Medical History:  Diagnosis Date  . Aortic stenosis, mild 07/26/2017  . Arthritis   . Asthma    last attack 02/2015  . Atrial fibrillation, chronic ([place])   . Cellulitis of left lower extremity 08/17/2016   Ulcer associated with severe venous insufficiency  . Chronic anticoagulation   . Chronic diastolic CHF (congestive heart failure) ([place])   . Chronic kidney disease    "RIGHT MANY KIDNEY INFECTIONS AND STONES"  . COPD (chronic obstructive pulmonary disease) ([place])   . Coronary artery disease   . Dizziness   . H/O: rheumatic fever   . Heart murmur   . Hypertension   . PONV (postoperative nausea and vomiting)    ' SOMETIMES', BUT NOT ALWAYS"  . S/P Maze operation for atrial fibrillation 10/14/2016   Complete bilateral atrial lesion set using cryothermy and bipolar radiofrequency  ablation - atrial appendage was not treated due to previous surgical procedure (open mitral commissurotomy)  . S/P mitral valve replacement with bioprosthetic valve 10/14/2016   29 mm Medtronic Mosaic porcine bioprosthetic tissue valve  . UTI (urinary tract infection)   . Valvular heart disease    Has mitral stenosis with prior mitral commissurotomy in 1970   Past Surgical History:  Procedure Laterality Date  . ABDOMINAL HYSTERECTOMY  1983   endometriosis  . APPENDECTOMY    . BACK SURGERY     neurosurgery x2  . CARDIAC CATHETERIZATION    . CARDIAC CATHETERIZATION N/A 08/03/2016   Procedure: Right/Left Heart Cath and Coronary Angiography;  Surgeon: Peter M Martinique, MD;  Location: [facility];  Service: Cardiovascular;  Laterality: N/A;  . cataract surg    . CHOLECYSTECTOMY N/A 05/30/2015   Procedure: LAPAROSCOPIC CHOLECYSTECTOMY WITH INTRAOPERATIVE CHOLANGIOGRAM;  Surgeon: Excell Seltzer, MD;  Location: [facility];  Service: General;  Laterality: N/A;  . COLONOSCOPY    . CORONARY ARTERY BYPASS GRAFT N/A 10/14/2016   Procedure: CORONARY ARTERY BYPASS GRAFTING (CABG);  Surgeon: Rexene Alberts, MD;  Location: [facility];  Service: Open Heart Surgery;  Laterality: N/A;  . EYE SURGERY    . MAZE N/A 10/14/2016   Procedure: MAZE;  Surgeon: Rexene Alberts, MD;  Location: [facility];  Service: Open Heart Surgery;  Laterality: N/A;  .  MITRAL VALVE REPLACEMENT N/A 10/14/2016   Procedure: REDO MITRAL VALVE REPLACEMENT (MVR);  Surgeon: Rexene Alberts, MD;  Location: [facility];  Service: Open Heart Surgery;  Laterality: N/A;  . MITRAL VALVE SURGERY Left 1970   Open mitral commissurotomy via left thoracotomy approach  . TEE WITHOUT CARDIOVERSION N/A 07/05/2016   Procedure: TRANSESOPHAGEAL ECHOCARDIOGRAM (TEE);  Surgeon: [name], MD;  Location: [place];  Service: Cardiovascular;  Laterality: N/A;  . TEE WITHOUT CARDIOVERSION N/A 10/14/2016   Procedure: TRANSESOPHAGEAL ECHOCARDIOGRAM (TEE);  Surgeon:  Rexene Alberts, MD;  Location: [facility];  Service: Open Heart Surgery;  Laterality: N/A;    reports that she quit smoking about 44 years ago. Her smoking use included cigarettes. She has a 15.00 pack-year smoking history. She has never used smokeless tobacco. She reports that she does not drink alcohol or use drugs. family history includes Leukemia in her father. Allergies  Allergen Reactions  . Aldactone [Spironolactone] Other (See Comments)    dyspnea  . Amoxicillin Palpitations    Tachycardia Has patient had a PCN reaction causing immediate rash, facial/tongue/throat swelling, SOB or lightheadedness with hypotension: no Has patient had a PCN reaction causing severe rash involving mucus membranes or skin necrosis: {no Has patient had a PCN reaction that required hospitalization {no Has patient had a PCN reaction occurring within the last 10 years: {yes If all of the above answers are "NO", then may proceed with Cephalosporin use.  . Diltiazem Other (See Comments)    Causing headaches   . Flagyl [Metronidazole Hcl] Other (See Comments)    Causing headaches    . Flovent [Fluticasone Propionate] Other (See Comments)    Leg cramps  . Gabapentin Swelling  . Lyrica [Pregabalin] Swelling  . Quinidine Diarrhea and Other (See Comments)    Fever diarrhea  . Simvastatin Other (See Comments)    Leg pain, myalgia  . Tramadol Nausea Only  . Verapamil Other (See Comments)    myalgias  . Amlodipine     Low extremity edema  . Zetia [Ezetimibe]     LEG CRAMPS   . Ace Inhibitors Other (See Comments)    unknown  . Benazepril Hcl Cough  . Ciprocin-Fluocin-Procin [Fluocinolone Acetonide] Other (See Comments)    unknown  . Ciprofloxacin Diarrhea  . Codeine Nausea Only  . Nitrofurantoin Monohyd Macro Nausea Only     Review of Systems  Constitutional: Positive for fatigue. Negative for chills and fever.  Respiratory: Positive for shortness of breath. Negative for cough.   Cardiovascular:  Negative for chest pain.  Gastrointestinal: Negative for abdominal pain, diarrhea, nausea and vomiting.  Genitourinary: Negative for dysuria.  Neurological: Positive for weakness. Negative for syncope, speech difficulty and headaches.       Objective:   Physical Exam Vitals reviewed.  Constitutional:      Appearance: Normal appearance.  Cardiovascular:     Rate and Rhythm: Normal rate.  Pulmonary:     Effort: Pulmonary effort is normal.     Breath sounds: Normal breath sounds.  Skin:    Comments: Left leg reveals intense erythema involving most of the leg.  No open wounds.  Leg is warm to palpation and slightly tender  Neurological:     Mental Status: She is alert.        Assessment:     #1 recent COVID-19 infection.  She has some persistent fatigue.  Her infection was complicated by recent severe enteritis.  Symptoms now improved with exception of the fatigue.  #2  normocytic anemia.  Recent ferritin level was 200 but likely elevated as acute phase reactant.  She denies any obvious blood loss.  #3 acute cellulitis left leg.  Patient does not have any fever and is nontoxic in appearance  #4 hypertension stable off Maxide  #5 hyperlipidemia-recent initiation of Crestor per cardiology and patient needing follow-up lipids    Plan:     -Recheck labs with basic metabolic panel, CBC, INR per Coumadin clinic  -Start Keflex 500 mg 4 times daily for 7 days and reassess in 1 week.  Also instructed to elevate leg frequently  -We recommended 1 week follow-up to reassess her INR and also to reassess the left leg.  Follow-up sooner for any fever or worsening symptoms   -Recheck lipid and hepatic panel and forward copy to cardiology  [name] MD [facility]

## 2019-09-24 NOTE — Patient Instructions (Signed)

## 2019-09-24 NOTE — Telephone Encounter (Signed)
Hope from the lab called to let us know that patient had a critical lab and her potassium is 2.7. Sending as Catherine [name]

## 2019-09-24 NOTE — Patient Instructions (Addendum)
Pre visit review using our clinic review tool, if applicable. No additional management support is needed unless otherwise documented below in the visit note. Hold coumadin today, tomorrow and Wednesday.  On Thursday continue to take 1 tablet daily except 2 tablets only on Mondays.  Re-check in 1 week.

## 2019-09-24 NOTE — Telephone Encounter (Signed)
See lab result note.

## 2019-09-29 ENCOUNTER — Other Ambulatory Visit: Payer: Self-pay | Admitting: Cardiovascular Disease

## 2019-10-01 ENCOUNTER — Other Ambulatory Visit: Payer: Self-pay

## 2019-10-01 ENCOUNTER — Ambulatory Visit (INDEPENDENT_AMBULATORY_CARE_PROVIDER_SITE_OTHER): Payer: Medicare Other | Admitting: Family Medicine

## 2019-10-01 ENCOUNTER — Ambulatory Visit (INDEPENDENT_AMBULATORY_CARE_PROVIDER_SITE_OTHER): Payer: Medicare Other | Admitting: General Practice

## 2019-10-01 ENCOUNTER — Encounter: Payer: Self-pay | Admitting: Family Medicine

## 2019-10-01 VITALS — BP 124/70 | HR 75 | Temp 97.7°F | Ht 63.0 in | Wt 149.9 lb

## 2019-10-01 DIAGNOSIS — I482 Chronic atrial fibrillation, unspecified: Secondary | ICD-10-CM

## 2019-10-01 DIAGNOSIS — I4891 Unspecified atrial fibrillation: Secondary | ICD-10-CM | POA: Diagnosis not present

## 2019-10-01 DIAGNOSIS — L03116 Cellulitis of left lower limb: Secondary | ICD-10-CM | POA: Diagnosis not present

## 2019-10-01 DIAGNOSIS — E876 Hypokalemia: Secondary | ICD-10-CM

## 2019-10-01 LAB — BASIC METABOLIC PANEL
BUN: 8 mg/dL (ref 6–23)
CO2: 32 mEq/L (ref 19–32)
Calcium: 8.7 mg/dL (ref 8.4–10.5)
Chloride: 106 mEq/L (ref 96–112)
Creatinine, Ser: 0.81 mg/dL (ref 0.40–1.20)
GFR: 67.66 mL/min (ref >60.00)
Glucose, Bld: 116 mg/dL — ABNORMAL HIGH (ref 70–99)
Potassium: 3.6 mEq/L (ref 3.5–5.1)
Sodium: 145 mEq/L (ref 135–145)

## 2019-10-01 LAB — POCT INR: INR: 1.6 — AB (ref 2.0–3.0)

## 2019-10-01 NOTE — Patient Instructions (Signed)
Finish out the antibiotic  Continue to elevate leg frequently  Follow up for any fever, increased redness, or increasing warmth or pain.

## 2019-10-01 NOTE — Progress Notes (Signed)
Subjective:     Patient ID: Catherine [name], female   DOB: 26-May-1935, 85 y.o.   MRN: [id number]  HPI Catherine [name] is seen for follow-up regarding cellulitis left leg.  We started Keflex.  She has many drug allergies listed in Keflex as one of the few choices we have.  She started have some diarrhea after taking this couple days and after reducing dosage from 4 a day to 3 a day her diarrhea has now resolved.  She has 1 more day of Keflex left.  She has had less redness.  No fevers or chills.  Recent white count normal.  Redness is subsiding and less heat.  No significant pain.  We obtained lab work and her hemoglobin is improving from recent admission.  She had severe hypokalemia with potassium 2.7.  She is taking 6 tabs of potassium daily and is trying to eat good potassium sources such as bananas and potatoes.  No significant muscle cramps.  She is very deconditioned following her recent admission and is still recovering from that  Past Medical History:  Diagnosis Date  . Aortic stenosis, mild 07/26/2017  . Arthritis   . Asthma    last attack 02/2015  . Atrial fibrillation, chronic ([place])   . Cellulitis of left lower extremity 08/17/2016   Ulcer associated with severe venous insufficiency  . Chronic anticoagulation   . Chronic diastolic CHF (congestive heart failure) ([place])   . Chronic kidney disease    "RIGHT MANY KIDNEY INFECTIONS AND STONES"  . COPD (chronic obstructive pulmonary disease) ([place])   . Coronary artery disease   . Dizziness   . H/O: rheumatic fever   . Heart murmur   . Hypertension   . PONV (postoperative nausea and vomiting)    ' SOMETIMES', BUT NOT ALWAYS"  . S/P Maze operation for atrial fibrillation 10/14/2016   Complete bilateral atrial lesion set using cryothermy and bipolar radiofrequency ablation - atrial appendage was not treated due to previous surgical procedure (open mitral commissurotomy)  . S/P mitral valve replacement with bioprosthetic valve 10/14/2016   29 mm  Medtronic Mosaic porcine bioprosthetic tissue valve  . UTI (urinary tract infection)   . Valvular heart disease    Has mitral stenosis with prior mitral commissurotomy in 1970   Past Surgical History:  Procedure Laterality Date  . ABDOMINAL HYSTERECTOMY  1983   endometriosis  . APPENDECTOMY    . BACK SURGERY     neurosurgery x2  . CARDIAC CATHETERIZATION    . CARDIAC CATHETERIZATION N/A 08/03/2016   Procedure: Right/Left Heart Cath and Coronary Angiography;  Surgeon: Peter M Martinique, MD;  Location: [place] [facility];  Service: Cardiovascular;  Laterality: N/A;  . cataract surg    . CHOLECYSTECTOMY N/A 05/30/2015   Procedure: LAPAROSCOPIC CHOLECYSTECTOMY WITH INTRAOPERATIVE CHOLANGIOGRAM;  Surgeon: Excell Seltzer, MD;  Location: [place];  Service: General;  Laterality: N/A;  . COLONOSCOPY    . CORONARY ARTERY BYPASS GRAFT N/A 10/14/2016   Procedure: CORONARY ARTERY BYPASS GRAFTING (CABG);  Surgeon: Rexene Alberts, MD;  Location: [place];  Service: Open Heart Surgery;  Laterality: N/A;  . EYE SURGERY    . MAZE N/A 10/14/2016   Procedure: MAZE;  Surgeon: Rexene Alberts, MD;  Location: [place];  Service: Open Heart Surgery;  Laterality: N/A;  . MITRAL VALVE REPLACEMENT N/A 10/14/2016   Procedure: REDO MITRAL VALVE REPLACEMENT (MVR);  Surgeon: Rexene Alberts, MD;  Location: [place];  Service: Open Heart Surgery;  Laterality: N/A;  .  MITRAL VALVE SURGERY Left 1970   Open mitral commissurotomy via left thoracotomy approach  . TEE WITHOUT CARDIOVERSION N/A 07/05/2016   Procedure: TRANSESOPHAGEAL ECHOCARDIOGRAM (TEE);  Surgeon: [name], MD;  Location: [place];  Service: Cardiovascular;  Laterality: N/A;  . TEE WITHOUT CARDIOVERSION N/A 10/14/2016   Procedure: TRANSESOPHAGEAL ECHOCARDIOGRAM (TEE);  Surgeon: Rexene Alberts, MD;  Location: [place];  Service: Open Heart Surgery;  Laterality: N/A;    reports that she quit smoking about 44 years ago. Her smoking use included cigarettes. She has  a 15.00 pack-year smoking history. She has never used smokeless tobacco. She reports that she does not drink alcohol or use drugs. family history includes Leukemia in her father. Allergies  Allergen Reactions  . Aldactone [Spironolactone] Other (See Comments)    dyspnea  . Amoxicillin Palpitations    Tachycardia Has patient had a PCN reaction causing immediate rash, facial/tongue/throat swelling, SOB or lightheadedness with hypotension: no Has patient had a PCN reaction causing severe rash involving mucus membranes or skin necrosis: {no Has patient had a PCN reaction that required hospitalization {no Has patient had a PCN reaction occurring within the last 10 years: {yes If all of the above answers are "NO", then may proceed with Cephalosporin use.  . Diltiazem Other (See Comments)    Causing headaches   . Flagyl [Metronidazole Hcl] Other (See Comments)    Causing headaches    . Flovent [Fluticasone Propionate] Other (See Comments)    Leg cramps  . Gabapentin Swelling  . Lyrica [Pregabalin] Swelling  . Quinidine Diarrhea and Other (See Comments)    Fever diarrhea  . Simvastatin Other (See Comments)    Leg pain, myalgia  . Tramadol Nausea Only  . Verapamil Other (See Comments)    myalgias  . Amlodipine     Low extremity edema  . Zetia [Ezetimibe]     LEG CRAMPS   . Ace Inhibitors Other (See Comments)    unknown  . Benazepril Hcl Cough  . Ciprocin-Fluocin-Procin [Fluocinolone Acetonide] Other (See Comments)    unknown  . Ciprofloxacin Diarrhea  . Codeine Nausea Only  . Nitrofurantoin Monohyd Macro Nausea Only     Review of Systems  Constitutional: Positive for fatigue. Negative for chills, fever and unexpected weight change.  Eyes: Negative for visual disturbance.  Respiratory: Negative for cough, chest tightness and wheezing.   Cardiovascular: Positive for leg swelling. Negative for chest pain and palpitations.  Neurological: Negative for dizziness, seizures,  syncope, weakness, light-headedness and headaches.       Objective:   Physical Exam Vitals reviewed.  Constitutional:      Appearance: Normal appearance.  Cardiovascular:     Rate and Rhythm: Normal rate and regular rhythm.  Pulmonary:     Effort: Pulmonary effort is normal.     Breath sounds: Normal breath sounds.  Skin:    Comments: She has some redness still the left leg but much improved compared to a week ago.  Less warmth.  Nontender.  No breakdown in skin.  Good capillary refill  Neurological:     Mental Status: She is alert.        Assessment:     #1 cellulitis left leg improving on Keflex.  #2 severe hypokalemia with recent potassium 2.7.  Risk factor of Lasix 40 mg twice daily and recent likely depletion from gastroenteritis and decreased oral intake.  Thankfully her intake is improving  #3 chronic anticoagulation on Coumadin.  INR today 1.6 per Coumadin clinic  Plan:     -Finish out Keflex -Continue frequent leg elevation -Follow-up promptly for increased redness, fever, chills, or increased warmth or pain -Recheck basic metabolic panel today  [name] MD [facility] at [facility]

## 2019-10-01 NOTE — Patient Instructions (Addendum)
Pre visit review using our clinic review tool, if applicable. No additional management support is needed unless otherwise documented below in the visit note.  Take 3 tablets today (1/4) and then continue to take 1 tablet daily except 2 tablets only on Mondays.  Re-check in 3 weeks.

## 2019-10-09 ENCOUNTER — Telehealth: Payer: Self-pay | Admitting: *Deleted

## 2019-10-09 NOTE — Telephone Encounter (Signed)
Please see message.  Please advise. 

## 2019-10-09 NOTE — Telephone Encounter (Signed)
Copied from [place] [phone]. Topic: General - Other >> Oct 09, 2019  1:57 PM Rainey Pines A wrote: Patient would like a callback from [facility] in regards to her legs not doing any better since her appt last week. Patient would like to know what Dr.Burchette would advise her to do next about her legs.

## 2019-10-09 NOTE — Telephone Encounter (Signed)
Called patient and scheduled her for 10:30am tomorrow. Patient verbalized an understanding.

## 2019-10-09 NOTE — Telephone Encounter (Signed)
Need more information.  Does she mean "not better "in terms of cellulitis changes or peripheral edema or both.  Consider follow-up in office tomorrow to reassess

## 2019-10-10 ENCOUNTER — Ambulatory Visit (INDEPENDENT_AMBULATORY_CARE_PROVIDER_SITE_OTHER): Payer: Medicare Other | Admitting: Family Medicine

## 2019-10-10 ENCOUNTER — Other Ambulatory Visit: Payer: Self-pay

## 2019-10-10 ENCOUNTER — Encounter: Payer: Self-pay | Admitting: Family Medicine

## 2019-10-10 VITALS — BP 106/58 | HR 71 | Temp 98.0°F | Ht 63.0 in | Wt 147.8 lb

## 2019-10-10 DIAGNOSIS — L03116 Cellulitis of left lower limb: Secondary | ICD-10-CM

## 2019-10-10 MED ORDER — FLUCONAZOLE 150 MG PO TABS: 150.0000 mg | ORAL_TABLET | Freq: Once | ORAL | 0 refills | Status: DC

## 2019-10-10 MED ORDER — CEPHALEXIN 500 MG PO CAPS: 500.0000 mg | ORAL_CAPSULE | Freq: Three times a day (TID) | ORAL | 0 refills | Status: DC

## 2019-10-10 NOTE — Progress Notes (Signed)
Subjective:     Patient ID: Catherine [name], female   DOB: 1935/05/12, 85 y.o.   MRN: [id number]  HPI [name] is seen with swelling in both legs and increasing redness in the left leg.  Treated recently for cellulitis with Keflex.  She did see some improvement when on Keflex.  She finished aantibiotic 8 days ago.  A few days ago she started having increased pain in the left leg along with some increased warmth and increased redness.  She has multiple medication allergies and has reported allergies to penicillin, Cipro, Macrobid.  Denies any fevers or chills. No known hx of MRSA.    Past Medical History:  Diagnosis Date  . Aortic stenosis, mild 07/26/2017  . Arthritis   . Asthma    last attack 02/2015  . Atrial fibrillation, chronic ([place])   . Cellulitis of left lower extremity 08/17/2016   Ulcer associated with severe venous insufficiency  . Chronic anticoagulation   . Chronic diastolic CHF (congestive heart failure) ([place])   . Chronic kidney disease    "RIGHT MANY KIDNEY INFECTIONS AND STONES"  . COPD (chronic obstructive pulmonary disease) ([place])   . Coronary artery disease   . Dizziness   . H/O: rheumatic fever   . Heart murmur   . Hypertension   . PONV (postoperative nausea and vomiting)    ' SOMETIMES', BUT NOT ALWAYS"  . S/P Maze operation for atrial fibrillation 10/14/2016   Complete bilateral atrial lesion set using cryothermy and bipolar radiofrequency ablation - atrial appendage was not treated due to previous surgical procedure (open mitral commissurotomy)  . S/P mitral valve replacement with bioprosthetic valve 10/14/2016   29 mm Medtronic Mosaic porcine bioprosthetic tissue valve  . UTI (urinary tract infection)   . Valvular heart disease    Has mitral stenosis with prior mitral commissurotomy in 1970   Past Surgical History:  Procedure Laterality Date  . ABDOMINAL HYSTERECTOMY  1983   endometriosis  . APPENDECTOMY    . BACK SURGERY     neurosurgery x2  . CARDIAC  CATHETERIZATION    . CARDIAC CATHETERIZATION N/A 08/03/2016   Procedure: Right/Left Heart Cath and Coronary Angiography;  Surgeon: Peter M Martinique, MD;  Location: [place] [facility];  Service: Cardiovascular;  Laterality: N/A;  . cataract surg    . CHOLECYSTECTOMY N/A 05/30/2015   Procedure: LAPAROSCOPIC CHOLECYSTECTOMY WITH INTRAOPERATIVE CHOLANGIOGRAM;  Surgeon: Excell Seltzer, MD;  Location: [place];  Service: General;  Laterality: N/A;  . COLONOSCOPY    . CORONARY ARTERY BYPASS GRAFT N/A 10/14/2016   Procedure: CORONARY ARTERY BYPASS GRAFTING (CABG);  Surgeon: Rexene Alberts, MD;  Location: [place];  Service: Open Heart Surgery;  Laterality: N/A;  . EYE SURGERY    . MAZE N/A 10/14/2016   Procedure: MAZE;  Surgeon: Rexene Alberts, MD;  Location: [place];  Service: Open Heart Surgery;  Laterality: N/A;  . MITRAL VALVE REPLACEMENT N/A 10/14/2016   Procedure: REDO MITRAL VALVE REPLACEMENT (MVR);  Surgeon: Rexene Alberts, MD;  Location: [place];  Service: Open Heart Surgery;  Laterality: N/A;  . MITRAL VALVE SURGERY Left 1970   Open mitral commissurotomy via left thoracotomy approach  . TEE WITHOUT CARDIOVERSION N/A 07/05/2016   Procedure: TRANSESOPHAGEAL ECHOCARDIOGRAM (TEE);  Surgeon: [name], MD;  Location: [place];  Service: Cardiovascular;  Laterality: N/A;  . TEE WITHOUT CARDIOVERSION N/A 10/14/2016   Procedure: TRANSESOPHAGEAL ECHOCARDIOGRAM (TEE);  Surgeon: Rexene Alberts, MD;  Location: [place];  Service: Open Heart  Surgery;  Laterality: N/A;    reports that she quit smoking about 44 years ago. Her smoking use included cigarettes. She has a 15.00 pack-year smoking history. She has never used smokeless tobacco. She reports that she does not drink alcohol or use drugs. family history includes Leukemia in her father. Allergies  Allergen Reactions  . Aldactone [Spironolactone] Other (See Comments)    dyspnea  . Amoxicillin Palpitations    Tachycardia Has patient had a PCN reaction  causing immediate rash, facial/tongue/throat swelling, SOB or lightheadedness with hypotension: no Has patient had a PCN reaction causing severe rash involving mucus membranes or skin necrosis: {no Has patient had a PCN reaction that required hospitalization {no Has patient had a PCN reaction occurring within the last 10 years: {yes If all of the above answers are "NO", then may proceed with Cephalosporin use.  . Diltiazem Other (See Comments)    Causing headaches   . Flagyl [Metronidazole Hcl] Other (See Comments)    Causing headaches    . Flovent [Fluticasone Propionate] Other (See Comments)    Leg cramps  . Gabapentin Swelling  . Lyrica [Pregabalin] Swelling  . Quinidine Diarrhea and Other (See Comments)    Fever diarrhea  . Simvastatin Other (See Comments)    Leg pain, myalgia  . Tramadol Nausea Only  . Verapamil Other (See Comments)    myalgias  . Amlodipine     Low extremity edema  . Zetia [Ezetimibe]     LEG CRAMPS   . Ace Inhibitors Other (See Comments)    unknown  . Benazepril Hcl Cough  . Ciprocin-Fluocin-Procin [Fluocinolone Acetonide] Other (See Comments)    unknown  . Ciprofloxacin Diarrhea  . Codeine Nausea Only  . Nitrofurantoin Monohyd Macro Nausea Only     Review of Systems  Constitutional: Positive for fatigue. Negative for chills and fever.  Respiratory: Negative for cough.   Cardiovascular: Negative for chest pain.  Gastrointestinal: Negative for nausea and vomiting.       Objective:   Physical Exam Vitals reviewed.  Constitutional:      Appearance: Normal appearance.  Cardiovascular:     Rate and Rhythm: Normal rate and regular rhythm.  Pulmonary:     Effort: Pulmonary effort is normal.     Breath sounds: Normal breath sounds.  Skin:    Comments: Left leg reveals significant erythema involving most of the lateral aspect of the leg and anterior about proximal third down to the ankle.  No open skin wounds.  Warm to touch.  Slightly tender  to palpation.  Neurological:     Mental Status: She is alert.        Assessment:     Recurrent versus persistent cellulitis left leg    Plan:     -Recommend frequent elevation and also recommend heating pad on low heat several times daily  -Start back Keflex 500 mg 3 times daily for 10 days.  We discussed other possible options such as clindamycin but would like to try to avoid with her tendency toward diarrhea.  She is aware of risk of C. difficile with any antibiotic but at this point her symptoms seem to be clearly worsening in terms of cellulitis infection and she needs to be back on coverage  -1 week follow-up and reassess then  [name] MD [facility] at [facility]

## 2019-10-10 NOTE — Patient Instructions (Signed)

## 2019-10-15 ENCOUNTER — Other Ambulatory Visit: Payer: Self-pay

## 2019-10-15 ENCOUNTER — Ambulatory Visit (HOSPITAL_COMMUNITY): Payer: Medicare Other | Attending: Cardiovascular Disease

## 2019-10-15 DIAGNOSIS — Z5181 Encounter for therapeutic drug level monitoring: Secondary | ICD-10-CM

## 2019-10-15 DIAGNOSIS — E7800 Pure hypercholesterolemia, unspecified: Secondary | ICD-10-CM | POA: Diagnosis not present

## 2019-10-15 DIAGNOSIS — Z953 Presence of xenogenic heart valve: Secondary | ICD-10-CM

## 2019-10-15 DIAGNOSIS — I10 Essential (primary) hypertension: Secondary | ICD-10-CM | POA: Diagnosis not present

## 2019-10-15 MED ORDER — PERFLUTREN LIPID MICROSPHERE
1.0000 mL | INTRAVENOUS | Status: AC | PRN
  Administered 2019-10-15: 2 mL via INTRAVENOUS

## 2019-10-22 ENCOUNTER — Other Ambulatory Visit: Payer: Self-pay

## 2019-10-22 ENCOUNTER — Ambulatory Visit (INDEPENDENT_AMBULATORY_CARE_PROVIDER_SITE_OTHER): Payer: Medicare Other | Admitting: General Practice

## 2019-10-22 DIAGNOSIS — Z7901 Long term (current) use of anticoagulants: Secondary | ICD-10-CM

## 2019-10-22 LAB — POCT INR: INR: 3 (ref 2.0–3.0)

## 2019-10-22 NOTE — Patient Instructions (Addendum)
Pre visit review using our clinic review tool, if applicable. No additional management support is needed unless otherwise documented below in the visit note.  Hold dose today and then continue to take 1 tablet daily except 2 tablets only on Mondays.  Re-check in 3 weeks.

## 2019-10-23 ENCOUNTER — Other Ambulatory Visit: Payer: Self-pay

## 2019-10-23 DIAGNOSIS — I4891 Unspecified atrial fibrillation: Secondary | ICD-10-CM

## 2019-10-23 DIAGNOSIS — I4821 Permanent atrial fibrillation: Secondary | ICD-10-CM

## 2019-10-23 MED ORDER — WARFARIN SODIUM 2 MG PO TABS: 2.0000 mg | ORAL_TABLET | ORAL | 1 refills | Status: DC

## 2019-10-23 NOTE — Progress Notes (Signed)
Pt requested refill at coumadin clinic apt yesterday. Pt is in compliance with coumadin. Refill sent.

## 2019-10-29 ENCOUNTER — Encounter: Payer: Self-pay | Admitting: Family Medicine

## 2019-10-29 ENCOUNTER — Ambulatory Visit (INDEPENDENT_AMBULATORY_CARE_PROVIDER_SITE_OTHER): Payer: Medicare Other | Admitting: Family Medicine

## 2019-10-29 ENCOUNTER — Other Ambulatory Visit: Payer: Self-pay

## 2019-10-29 VITALS — BP 124/78 | HR 118 | Temp 98.2°F | Ht 63.0 in | Wt 149.1 lb

## 2019-10-29 DIAGNOSIS — I482 Chronic atrial fibrillation, unspecified: Secondary | ICD-10-CM

## 2019-10-29 DIAGNOSIS — I4891 Unspecified atrial fibrillation: Secondary | ICD-10-CM

## 2019-10-29 DIAGNOSIS — R350 Frequency of micturition: Secondary | ICD-10-CM

## 2019-10-29 LAB — POCT URINALYSIS DIPSTICK
Bilirubin, UA: NEGATIVE
Blood, UA: POSITIVE
Glucose, UA: NEGATIVE
Nitrite, UA: NEGATIVE
Protein, UA: POSITIVE — AB
Spec Grav, UA: 1.025 (ref 1.010–1.025)
Urobilinogen, UA: 0.2 E.U./dL
pH, UA: 6 (ref 5.0–8.0)

## 2019-10-29 MED ORDER — CEPHALEXIN 500 MG PO CAPS: 500.0000 mg | ORAL_CAPSULE | Freq: Three times a day (TID) | ORAL | 0 refills | Status: DC

## 2019-10-29 MED ORDER — METOPROLOL SUCCINATE ER 25 MG PO TB24: 25.0000 mg | ORAL_TABLET | Freq: Every day | ORAL | 5 refills | Status: DC

## 2019-10-29 NOTE — Progress Notes (Signed)
Subjective:     Patient ID: Catherine [name], female   DOB: September 22, 1935, 85 y.o.   MRN: [id number]  HPI Denay is seen today for the following issues.  She has history of frequent UTIs and started few days ago with recurrent urinary frequency and burning.  No gross hematuria.  No flank pain.  No fevers or chills.  She took some over-the-counter Azo without much improvement.  She has multiple drug allergies and we are very limited and antibiotic options because that.  She had some recent cellulitis left lower extremity and just finished Keflex about 2 weeks ago.  No diarrhea symptoms.  Chronic problems include history of rheumatic heart disease, atrial fibrillation, hypertension, chronic diastolic heart failure, CAD, chronic Coumadin therapy.  She had Covid infection few months ago and states she feels that she is not been completely recovered since then.  No recent fever.  She has noted her heart rate seems to be increased recently but has been poorly controlled for quite some time.  She has had previous intolerances with diltiazem and verapamil.  Past Medical History:  Diagnosis Date  . Aortic stenosis, mild 07/26/2017  . Arthritis   . Asthma    last attack 02/2015  . Atrial fibrillation, chronic ([place])   . Cellulitis of left lower extremity 08/17/2016   Ulcer associated with severe venous insufficiency  . Chronic anticoagulation   . Chronic diastolic CHF (congestive heart failure) ([place])   . Chronic kidney disease    "RIGHT MANY KIDNEY INFECTIONS AND STONES"  . COPD (chronic obstructive pulmonary disease) ([place])   . Coronary artery disease   . Dizziness   . H/O: rheumatic fever   . Heart murmur   . Hypertension   . PONV (postoperative nausea and vomiting)    ' SOMETIMES', BUT NOT ALWAYS"  . S/P Maze operation for atrial fibrillation 10/14/2016   Complete bilateral atrial lesion set using cryothermy and bipolar radiofrequency ablation - atrial appendage was not treated due to previous  surgical procedure (open mitral commissurotomy)  . S/P mitral valve replacement with bioprosthetic valve 10/14/2016   29 mm Medtronic Mosaic porcine bioprosthetic tissue valve  . UTI (urinary tract infection)   . Valvular heart disease    Has mitral stenosis with prior mitral commissurotomy in 1970   Past Surgical History:  Procedure Laterality Date  . ABDOMINAL HYSTERECTOMY  1983   endometriosis  . APPENDECTOMY    . BACK SURGERY     neurosurgery x2  . CARDIAC CATHETERIZATION    . CARDIAC CATHETERIZATION N/A 08/03/2016   Procedure: Right/Left Heart Cath and Coronary Angiography;  Surgeon: Peter M Martinique, MD;  Location: [place] [facility];  Service: Cardiovascular;  Laterality: N/A;  . cataract surg    . CHOLECYSTECTOMY N/A 05/30/2015   Procedure: LAPAROSCOPIC CHOLECYSTECTOMY WITH INTRAOPERATIVE CHOLANGIOGRAM;  Surgeon: Excell Seltzer, MD;  Location: [place];  Service: General;  Laterality: N/A;  . COLONOSCOPY    . CORONARY ARTERY BYPASS GRAFT N/A 10/14/2016   Procedure: CORONARY ARTERY BYPASS GRAFTING (CABG);  Surgeon: Rexene Alberts, MD;  Location: [place];  Service: Open Heart Surgery;  Laterality: N/A;  . EYE SURGERY    . MAZE N/A 10/14/2016   Procedure: MAZE;  Surgeon: Rexene Alberts, MD;  Location: [place];  Service: Open Heart Surgery;  Laterality: N/A;  . MITRAL VALVE REPLACEMENT N/A 10/14/2016   Procedure: REDO MITRAL VALVE REPLACEMENT (MVR);  Surgeon: Rexene Alberts, MD;  Location: [place];  Service: Open Heart Surgery;  Laterality: N/A;  . MITRAL VALVE SURGERY Left 1970   Open mitral commissurotomy via left thoracotomy approach  . TEE WITHOUT CARDIOVERSION N/A 07/05/2016   Procedure: TRANSESOPHAGEAL ECHOCARDIOGRAM (TEE);  Surgeon: [name], MD;  Location: [place];  Service: Cardiovascular;  Laterality: N/A;  . TEE WITHOUT CARDIOVERSION N/A 10/14/2016   Procedure: TRANSESOPHAGEAL ECHOCARDIOGRAM (TEE);  Surgeon: Rexene Alberts, MD;  Location: [place];  Service: Open Heart  Surgery;  Laterality: N/A;    reports that she quit smoking about 44 years ago. Her smoking use included cigarettes. She has a 15.00 pack-year smoking history. She has never used smokeless tobacco. She reports that she does not drink alcohol or use drugs. family history includes Leukemia in her father. Allergies  Allergen Reactions  . Aldactone [Spironolactone] Other (See Comments)    dyspnea  . Amoxicillin Palpitations    Tachycardia Has patient had a PCN reaction causing immediate rash, facial/tongue/throat swelling, SOB or lightheadedness with hypotension: no Has patient had a PCN reaction causing severe rash involving mucus membranes or skin necrosis: {no Has patient had a PCN reaction that required hospitalization {no Has patient had a PCN reaction occurring within the last 10 years: {yes If all of the above answers are "NO", then may proceed with Cephalosporin use.  . Diltiazem Other (See Comments)    Causing headaches   . Flagyl [Metronidazole Hcl] Other (See Comments)    Causing headaches    . Flovent [Fluticasone Propionate] Other (See Comments)    Leg cramps  . Gabapentin Swelling  . Lyrica [Pregabalin] Swelling  . Quinidine Diarrhea and Other (See Comments)    Fever diarrhea  . Simvastatin Other (See Comments)    Leg pain, myalgia  . Tramadol Nausea Only  . Verapamil Other (See Comments)    myalgias  . Amlodipine     Low extremity edema  . Zetia [Ezetimibe]     LEG CRAMPS   . Ace Inhibitors Other (See Comments)    unknown  . Benazepril Hcl Cough  . Ciprocin-Fluocin-Procin [Fluocinolone Acetonide] Other (See Comments)    unknown  . Ciprofloxacin Diarrhea  . Codeine Nausea Only  . Nitrofurantoin Monohyd Macro Nausea Only     Review of Systems  Constitutional: Negative for appetite change, chills, fever and unexpected weight change.  Respiratory: Negative for cough and shortness of breath.   Cardiovascular: Positive for palpitations. Negative for chest  pain.  Gastrointestinal: Negative for abdominal pain, constipation, diarrhea, nausea and vomiting.  Genitourinary: Positive for dysuria and frequency. Negative for hematuria.  Musculoskeletal: Negative for back pain.  Neurological: Negative for dizziness.  Psychiatric/Behavioral: Negative for confusion.       Objective:   Physical Exam Vitals reviewed.  Constitutional:      Appearance: Normal appearance.  Cardiovascular:     Comments: Irregular rhythm consistent with her A. fib with rate around 120 Pulmonary:     Effort: Pulmonary effort is normal.     Breath sounds: Normal breath sounds.  Musculoskeletal:     Comments: No significant pitting edema  Neurological:     Mental Status: She is alert.        Assessment:     #1 recurrent dysuria.  She has had history of frequent UTIs in the past.  Nontoxic in appearance.  Afebrile  #2 history of chronic atrial fibrillation with increased ventricular response today and they state her heart rate has been elevated now for the past few weeks    Plan:     -Urine culture  sent -Start back Keflex 500 mg 3 times daily pending culture results.  If culture confirms significant pathogen consider low-dose prophylaxis possibly with low-dose cephalexin 250 mg once daily -Start Toprol-XL 25 mg daily and patient has scheduled follow-up already this Friday to reassess.  She will be encouraged to get back into see cardiology soon  [name] MD [facility] at [facility]

## 2019-10-29 NOTE — Patient Instructions (Signed)

## 2019-10-30 ENCOUNTER — Other Ambulatory Visit: Payer: Self-pay | Admitting: Family Medicine

## 2019-10-30 NOTE — Telephone Encounter (Signed)
Refill okay?  

## 2019-10-30 NOTE — Telephone Encounter (Signed)
OK to fill for patient?

## 2019-11-01 ENCOUNTER — Other Ambulatory Visit: Payer: Self-pay | Admitting: Family Medicine

## 2019-11-01 ENCOUNTER — Other Ambulatory Visit: Payer: Self-pay

## 2019-11-01 LAB — URINE CULTURE
MICRO NUMBER:: 10102195
SPECIMEN QUALITY:: ADEQUATE

## 2019-11-01 MED ORDER — CEPHALEXIN 250 MG PO CAPS: 250.0000 mg | ORAL_CAPSULE | Freq: Every day | ORAL | 3 refills | Status: DC

## 2019-11-02 ENCOUNTER — Encounter: Payer: Self-pay | Admitting: Family Medicine

## 2019-11-02 ENCOUNTER — Ambulatory Visit (INDEPENDENT_AMBULATORY_CARE_PROVIDER_SITE_OTHER): Payer: Medicare Other | Admitting: Family Medicine

## 2019-11-02 VITALS — BP 130/80 | HR 73 | Temp 97.6°F | Wt 150.7 lb

## 2019-11-02 DIAGNOSIS — N390 Urinary tract infection, site not specified: Secondary | ICD-10-CM | POA: Diagnosis not present

## 2019-11-02 DIAGNOSIS — R600 Localized edema: Secondary | ICD-10-CM

## 2019-11-02 DIAGNOSIS — H6123 Impacted cerumen, bilateral: Secondary | ICD-10-CM

## 2019-11-02 NOTE — Patient Instructions (Signed)
[email or web address]

## 2019-11-02 NOTE — Progress Notes (Signed)
Established Patient Office Visit  Subjective:  Patient ID: Catherine [name], female    DOB: 1935-02-03  Age: 85 y.o. MRN: [id number]  CC:  Chief Complaint  Patient presents with  . Follow-up    HPI Catherine [name] presents for follow-up of UTI. Will complete course of Keflex 500 mg TID tomorrow. States burning with urination has improved, still feels a slight burning when she urinates.  We discussed Dr. Erick Blinks plan to have patient take a daily dose of Keflex for an extended period of time for management of chronic UTIs. Patient asks me to examine her LLE for edema. Leg is mildly edematous with scarring from a previous leg injury on the treadmill. Discoloration is noted with some redness at the top of the food and some purplish areas scattered throughout the lower part of the calf with blanching. Skin is not warm to touch, no weeping, does not appear to have cellulitis. Area of previous injury has soft pitting, appears to be seroma. She also requests examination of ears for cerumen impaction which she has had in the past.   Past Medical History:  Diagnosis Date  . Aortic stenosis, mild 07/26/2017  . Arthritis   . Asthma    last attack 02/2015  . Atrial fibrillation, chronic ([place])   . Cellulitis of left lower extremity 08/17/2016   Ulcer associated with severe venous insufficiency  . Chronic anticoagulation   . Chronic diastolic CHF (congestive heart failure) ([place])   . Chronic kidney disease    "RIGHT MANY KIDNEY INFECTIONS AND STONES"  . COPD (chronic obstructive pulmonary disease) ([place])   . Coronary artery disease   . Dizziness   . H/O: rheumatic fever   . Heart murmur   . Hypertension   . PONV (postoperative nausea and vomiting)    ' SOMETIMES', BUT NOT ALWAYS"  . S/P Maze operation for atrial fibrillation 10/14/2016   Complete bilateral atrial lesion set using cryothermy and bipolar radiofrequency ablation - atrial appendage was not treated due to previous surgical  procedure (open mitral commissurotomy)  . S/P mitral valve replacement with bioprosthetic valve 10/14/2016   29 mm Medtronic Mosaic porcine bioprosthetic tissue valve  . UTI (urinary tract infection)   . Valvular heart disease    Has mitral stenosis with prior mitral commissurotomy in 1970    Past Surgical History:  Procedure Laterality Date  . ABDOMINAL HYSTERECTOMY  1983   endometriosis  . APPENDECTOMY    . BACK SURGERY     neurosurgery x2  . CARDIAC CATHETERIZATION    . CARDIAC CATHETERIZATION N/A 08/03/2016   Procedure: Right/Left Heart Cath and Coronary Angiography;  Surgeon: Peter M Martinique, MD;  Location: [place] [facility];  Service: Cardiovascular;  Laterality: N/A;  . cataract surg    . CHOLECYSTECTOMY N/A 05/30/2015   Procedure: LAPAROSCOPIC CHOLECYSTECTOMY WITH INTRAOPERATIVE CHOLANGIOGRAM;  Surgeon: Excell Seltzer, MD;  Location: [place];  Service: General;  Laterality: N/A;  . COLONOSCOPY    . CORONARY ARTERY BYPASS GRAFT N/A 10/14/2016   Procedure: CORONARY ARTERY BYPASS GRAFTING (CABG);  Surgeon: Rexene Alberts, MD;  Location: [place];  Service: Open Heart Surgery;  Laterality: N/A;  . EYE SURGERY    . MAZE N/A 10/14/2016   Procedure: MAZE;  Surgeon: Rexene Alberts, MD;  Location: [place];  Service: Open Heart Surgery;  Laterality: N/A;  . MITRAL VALVE REPLACEMENT N/A 10/14/2016   Procedure: REDO MITRAL VALVE REPLACEMENT (MVR);  Surgeon: Rexene Alberts, MD;  Location: [facility]  OR;  Service: Open Heart Surgery;  Laterality: N/A;  . MITRAL VALVE SURGERY Left 1970   Open mitral commissurotomy via left thoracotomy approach  . TEE WITHOUT CARDIOVERSION N/A 07/05/2016   Procedure: TRANSESOPHAGEAL ECHOCARDIOGRAM (TEE);  Surgeon: [name], MD;  Location: [place];  Service: Cardiovascular;  Laterality: N/A;  . TEE WITHOUT CARDIOVERSION N/A 10/14/2016   Procedure: TRANSESOPHAGEAL ECHOCARDIOGRAM (TEE);  Surgeon: Rexene Alberts, MD;  Location: [place];  Service: Open Heart Surgery;   Laterality: N/A;    Family History  Problem Relation Age of Onset  . Leukemia Father   . Breast cancer Neg Hx     Social History   Socioeconomic History  . Marital status: Married    Spouse name: Not on file  . Number of children: Not on file  . Years of education: Not on file  . Highest education level: Not on file  Occupational History  . Not on file  Tobacco Use  . Smoking status: Former Smoker    Packs/day: 1.00    Years: 15.00    Pack years: 15.00    Types: Cigarettes    Quit date: 09/28/1975    Years since quitting: 44.1  . Smokeless tobacco: Never Used  Substance and Sexual Activity  . Alcohol use: No    Alcohol/week: 0.0 standard drinks  . Drug use: No  . Sexual activity: Yes  Other Topics Concern  . Not on file  Social History Narrative  . Not on file   Social Determinants of Health   Financial Resource Strain:   . Difficulty of Paying Living Expenses: Not on file  Food Insecurity:   . Worried About Charity fundraiser in the Last Year: Not on file  . Ran Out of Food in the Last Year: Not on file  Transportation Needs:   . Lack of Transportation (Medical): Not on file  . Lack of Transportation (Non-Medical): Not on file  Physical Activity:   . Days of Exercise per Week: Not on file  . Minutes of Exercise per Session: Not on file  Stress:   . Feeling of Stress : Not on file  Social Connections:   . Frequency of Communication with Friends and Family: Not on file  . Frequency of Social Gatherings with Friends and Family: Not on file  . Attends Religious Services: Not on file  . Active Member of Clubs or Organizations: Not on file  . Attends Archivist Meetings: Not on file  . Marital Status: Not on file  Intimate Partner Violence:   . Fear of Current or Ex-Partner: Not on file  . Emotionally Abused: Not on file  . Physically Abused: Not on file  . Sexually Abused: Not on file    Outpatient Medications Prior to Visit  Medication Sig  Dispense Refill  . acetaminophen (TYLENOL) 500 MG tablet Take 500 mg by mouth daily.     Marland Kitchen albuterol (PROVENTIL HFA;VENTOLIN HFA) 108 (90 Base) MCG/ACT inhaler Inhale 2 puffs into the lungs every 6 (six) hours as needed for wheezing or shortness of breath. 1 Inhaler 0  . aspirin EC 81 MG EC tablet Take 1 tablet (81 mg total) by mouth daily.    . budesonide-formoterol (SYMBICORT) 80-4.5 MCG/ACT inhaler 2 PUFFS 2 TIMES A DAY (Patient taking differently: Inhale 2 puffs into the lungs 2 (two) times daily. ) 10.2 g 6  . cephALEXin (KEFLEX) 250 MG capsule Take 1 capsule (250 mg total) by mouth daily. 30 capsule 3  .  cephALEXin (KEFLEX) 500 MG capsule Take 1 capsule (500 mg total) by mouth 3 (three) times daily. 15 capsule 0  . diazepam (VALIUM) 5 MG tablet TAKE ONE TABLET AT BEDTIME (Patient taking differently: Take 5 mg by mouth at bedtime. ) 30 tablet 5  . doxazosin (CARDURA) 4 MG tablet TAKE 1 TABLET DAILY. STOP LOSARTAN (Patient taking differently: Take 4 mg by mouth daily. ) 90 tablet 0  . fluconazole (DIFLUCAN) 150 MG tablet TAKE 1 TABLET BY MOUTH ONCE FOR 1 DOSE 1 tablet 0  . furosemide (LASIX) 40 MG tablet TAKE 1 TABLET DAILY. MAY TAKE 1 EXTRA AS NEEDED FOR SWELLING (Patient taking differently: Take 40 mg by mouth daily. *May take one additional tablet daily as needed for swelling) 60 tablet 6  . metoprolol succinate (TOPROL-XL) 25 MG 24 hr tablet Take 1 tablet (25 mg total) by mouth daily. 30 tablet 5  . Multiple Vitamins-Minerals (HAIR/SKIN/NAILS/BIOTIN PO) Take 1 tablet by mouth daily.    . mupirocin ointment (BACTROBAN) 2 % Place 1 application into the nose 2 (two) times daily as needed (for affected areas below knee).    . ondansetron (ZOFRAN ODT) 4 MG disintegrating tablet Take 1 tablet (4 mg total) by mouth every 8 (eight) hours as needed for nausea or vomiting. 15 tablet 0  . OVER THE COUNTER MEDICATION Apply 1 application topically at bedtime as needed (Leg Pain). Magni-Life topical cream  for leg pain.    . potassium chloride (KLOR-CON) 10 MEQ tablet Take 1 tablet (10 mEq total) by mouth See admin instructions. If taking 1 lasix tab- Take 2 tablets by mouth twice a day.  If 2 tabs of lasix, then take 5 tabs of potassium 180 tablet 2  . rosuvastatin (CRESTOR) 5 MG tablet Take 2.5mg  every Monday and Friday for 2 weeks, then increase to 5mg  every Monday and Friday if able to tolerate. (Patient taking differently: Take 5 mg by mouth 2 (two) times a week. 5mg  every Monday and Friday if able to tolerate.) 20 tablet 1  . warfarin (COUMADIN) 2 MG tablet Take 1-2 tablets (2-4 mg total) by mouth See admin instructions. Take 1 tablet daily except 2 on Mondays or AS DIRECTED BY ANTICOAGULATION CLINIC  90 DAY 105 tablet 1   No facility-administered medications prior to visit.    Allergies  Allergen Reactions  . Aldactone [Spironolactone] Other (See Comments)    dyspnea  . Amoxicillin Palpitations    Tachycardia Has patient had a PCN reaction causing immediate rash, facial/tongue/throat swelling, SOB or lightheadedness with hypotension: no Has patient had a PCN reaction causing severe rash involving mucus membranes or skin necrosis: {no Has patient had a PCN reaction that required hospitalization {no Has patient had a PCN reaction occurring within the last 10 years: {yes If all of the above answers are "NO", then may proceed with Cephalosporin use.  . Diltiazem Other (See Comments)    Causing headaches   . Flagyl [Metronidazole Hcl] Other (See Comments)    Causing headaches    . Flovent [Fluticasone Propionate] Other (See Comments)    Leg cramps  . Gabapentin Swelling  . Lyrica [Pregabalin] Swelling  . Quinidine Diarrhea and Other (See Comments)    Fever diarrhea  . Simvastatin Other (See Comments)    Leg pain, myalgia  . Tramadol Nausea Only  . Verapamil Other (See Comments)    myalgias  . Amlodipine     Low extremity edema  . Zetia [Ezetimibe]     LEG CRAMPS   .  Ace  Inhibitors Other (See Comments)    unknown  . Benazepril Hcl Cough  . Ciprocin-Fluocin-Procin [Fluocinolone Acetonide] Other (See Comments)    unknown  . Ciprofloxacin Diarrhea  . Codeine Nausea Only  . Nitrofurantoin Monohyd Macro Nausea Only    ROS Review of Systems  Constitutional: Negative for chills and fever.  Respiratory: Negative for cough and shortness of breath.   Cardiovascular: Positive for leg swelling. Negative for chest pain and palpitations.  Genitourinary: Negative for dysuria, flank pain and hematuria.  Neurological: Negative for dizziness.  Hematological: Negative for adenopathy.  Psychiatric/Behavioral: Negative for confusion.    GEN: Well nourished, well developed, in no acute distress, Alert and interactive HEENT: normocephalic, atraumatic. PERRL, good conjugate gaze. Nares symmetric, patent. Moist mucous membranes Neck: Supple, no JVD, or masses. Normal ROM Cardiac: irregular rate and rhythm, hx a fib; no rubs, clicks, or gallops, systolic murmur noted at LUSB, mild edema left lower extremity, chronic Respiratory:  clear to auscultation bilaterally, no rales, rhonchi or wheezing, normal work of breathing GI: soft, nontender, nondistended, + BS, no masses MS: Normal tone and ROM, strength and sensation intact, cap refill <2sec, 2+ distal pulses bilaterally, no deformity or atrophy Skin: Intact, warm and dry, erythema on LLE without warmth, region consistent with chronic edema. Seroma identified in region of previous injury Neuro:  Alert and Oriented x 3, Cranial nerves II to XII intact, Reflex symmetric, Sensation intact, follows commands, gait normal Psych: euthymic mood, full affect    Objective:    Physical Exam  BP 130/80 (BP Location: Left Arm, Patient Position: Sitting, Cuff Size: Normal)   Pulse 73   Temp 97.6 F (36.4 C) (Temporal)   Wt 150 lb 11.2 oz (68.4 kg)   SpO2 99%   BMI 26.70 kg/m  Wt Readings from Last 3 Encounters:  11/02/19 150 lb  11.2 oz (68.4 kg)  10/29/19 149 lb 1.6 oz (67.6 kg)  10/10/19 147 lb 12.8 oz (67 kg)     There are no preventive care reminders to display for this patient.  There are no preventive care reminders to display for this patient.  Lab Results  Component Value Date   TSH 3.670 05/23/2018   Lab Results  Component Value Date   WBC 6.3 09/24/2019   HGB 11.0 (L) 09/24/2019   HCT 32.4 (L) 09/24/2019   MCV 92.0 09/24/2019   PLT 133.0 (L) 09/24/2019   Lab Results  Component Value Date   NA 145 10/01/2019   K 3.6 10/01/2019   CO2 32 10/01/2019   GLUCOSE 116 (H) 10/01/2019   BUN 8 10/01/2019   CREATININE 0.81 10/01/2019   BILITOT 0.9 09/24/2019   ALKPHOS 94 09/24/2019   AST 23 09/24/2019   ALT 22 09/24/2019   PROT 6.0 09/24/2019   ALBUMIN 3.9 09/24/2019   CALCIUM 8.7 10/01/2019   ANIONGAP 11 09/06/2019   GFR 67.66 10/01/2019   Lab Results  Component Value Date   CHOL 163 09/24/2019   Lab Results  Component Value Date   HDL 52.70 09/24/2019   Lab Results  Component Value Date   LDLCALC 135 (H) 06/06/2019   Lab Results  Component Value Date   TRIG 214.0 (H) 09/24/2019   Lab Results  Component Value Date   CHOLHDL 3 09/24/2019   Lab Results  Component Value Date   HGBA1C 5.3 10/12/2016      Assessment & Plan:   Problem List Items Addressed This Visit    None  ASSESSMENT: 1. Chronic UTI. Symptoms of urinary burning have improved with antibiotic therapy. She will complete antibiotic therapy tomorrow. She states she feels a very slight amount of burning with urination and is concerned burning will increase after being off the antibiotic.   2. Chronic left leg edema. Leg is mildly edematous with some mottled purple and red markings without warmth, weeping. Skin blanches easily. Pulses strong and skin is intact.  3. Cerumen present in bilateral ears, not obstructive. Patient denies hearing loss in bilateral ears.  PLAN: 1. Agreeable to take Keflex 250 mg  daily for 3 months for UTI prophylaxis. Will discuss plan to continue or d/c at next office visit in 3 months.  2. Shared information on Lounge Doctor with patient and she is agreeable to work on better leg elevation. Encouraged to try compression hose and walking frequently through the day.   3. No irrigation of ear cerumen necessary.   No orders of the defined types were placed in this encounter.   Follow-up: Return in about 3 months (around 01/30/2020).    Emmaline Life, RN  AGPCNP STUDENT, [employer] SON  Above reviewed and agree.   Hx of recurrent UTI with multiple infections past year.  Will start low dose Keflex 250 mg once daily and will need to follow protimes closely.   Have recommended get back on compression hose for her chronic bilateral leg edema  [name] MD [facility]

## 2019-11-09 ENCOUNTER — Telehealth: Payer: Self-pay | Admitting: Family Medicine

## 2019-11-09 NOTE — Telephone Encounter (Signed)
Called patient and she stated that she felt like she had Covid all over again, she received vaccine on Tuesday and woke up around 3am on Wednesday with headache, nausea, fever of around 100, chills, bone and muscle pain, and stated that she felt horrible. Patient states that she is feeling a little better and she is staying in the bed. Patient is worried about taking the second Covid vaccine now. Please advise.  Patient was advised to cancel coumadin clinic on  Monday and wanted to let Jenny Reichmann know so she could be rescheduled.

## 2019-11-09 NOTE — Telephone Encounter (Signed)
Called patient and gave her the message from Dr. Elease Hashimoto. Patient stated that she is so tired and she will rest today. I advised to stay hydrated, eat, and rest. Patient is drinking gatorade and gingerale, eating bread, and drinking a protein shake. Patient verbalized an understanding.

## 2019-11-09 NOTE — Telephone Encounter (Signed)
Call pt for appt on Monday and she stated that she have need very sick after taken the covid shot with chills,fever and muscle pain.

## 2019-11-09 NOTE — Telephone Encounter (Signed)
The symptoms she mentioned are certainly possible with Covid vaccine.  Because of her very high risk status though I would still consider completing the vaccine series.

## 2019-11-12 ENCOUNTER — Other Ambulatory Visit: Payer: Self-pay | Admitting: Cardiovascular Disease

## 2019-11-12 ENCOUNTER — Ambulatory Visit: Payer: Medicare Other

## 2019-11-16 ENCOUNTER — Other Ambulatory Visit: Payer: Self-pay

## 2019-11-19 ENCOUNTER — Ambulatory Visit (INDEPENDENT_AMBULATORY_CARE_PROVIDER_SITE_OTHER): Payer: Medicare Other | Admitting: General Practice

## 2019-11-19 ENCOUNTER — Other Ambulatory Visit: Payer: Self-pay

## 2019-11-19 DIAGNOSIS — Z7901 Long term (current) use of anticoagulants: Secondary | ICD-10-CM

## 2019-11-19 LAB — POCT INR: INR: 4.5 — AB (ref 2.0–3.0)

## 2019-11-19 NOTE — Patient Instructions (Addendum)
Pre visit review using our clinic review tool, if applicable. No additional management support is needed unless otherwise documented below in the visit note.  Hold dose today and tomorrow and then decrease dosage and take 1 tablet daily.  Re-check in 3 weeks.

## 2019-11-21 ENCOUNTER — Other Ambulatory Visit: Payer: Self-pay

## 2019-11-21 MED ORDER — DOXAZOSIN MESYLATE 4 MG PO TABS: ORAL_TABLET | ORAL | 0 refills | Status: DC

## 2019-11-30 ENCOUNTER — Other Ambulatory Visit: Payer: Self-pay | Admitting: *Deleted

## 2019-11-30 MED ORDER — POTASSIUM CHLORIDE ER 10 MEQ PO TBCR: EXTENDED_RELEASE_TABLET | ORAL | 1 refills | Status: DC

## 2019-11-30 NOTE — Telephone Encounter (Addendum)
Received request from pharmacy for Potassium refill stating taking 6 tablets daily  Spoke with patient and verified Ok per Dr Oval Linsey to refill  Refill sent to pharmacy

## 2019-12-03 ENCOUNTER — Other Ambulatory Visit: Payer: Self-pay | Admitting: Family Medicine

## 2019-12-07 ENCOUNTER — Other Ambulatory Visit: Payer: Self-pay

## 2019-12-10 ENCOUNTER — Ambulatory Visit (INDEPENDENT_AMBULATORY_CARE_PROVIDER_SITE_OTHER): Payer: Medicare Other | Admitting: General Practice

## 2019-12-10 ENCOUNTER — Other Ambulatory Visit: Payer: Self-pay

## 2019-12-10 DIAGNOSIS — I4891 Unspecified atrial fibrillation: Secondary | ICD-10-CM | POA: Diagnosis not present

## 2019-12-10 LAB — POCT INR: INR: 2.9 (ref 2.0–3.0)

## 2019-12-10 NOTE — Patient Instructions (Signed)
Pre visit review using our clinic review tool, if applicable. No additional management support is needed unless otherwise documented below in the visit note.  Continue to take 1 tablet daily.  Re-check in 4 weeks.

## 2019-12-22 ENCOUNTER — Other Ambulatory Visit: Payer: Self-pay | Admitting: Cardiovascular Disease

## 2020-01-08 ENCOUNTER — Encounter: Payer: Self-pay | Admitting: Internal Medicine

## 2020-01-08 ENCOUNTER — Ambulatory Visit: Payer: Medicare Other | Admitting: Internal Medicine

## 2020-01-08 ENCOUNTER — Other Ambulatory Visit: Payer: Self-pay

## 2020-01-08 DIAGNOSIS — J449 Chronic obstructive pulmonary disease, unspecified: Secondary | ICD-10-CM | POA: Diagnosis not present

## 2020-01-08 NOTE — Progress Notes (Signed)
Subjective:     Patient ID: Catherine [name], female   DOB: 11/20/1934,   MRN: [id number]    Brief patient profile:  76  yowf quit smoking in 1977 with GOLD II criteria 6/29016  at her best can do 5 days/7 walk x one half mile 3.2 mph flat but not doing as well since spring 2016 then admitted p flare of copd with new kidney stone/GB pain    Admit date: 03/15/2015 Discharge date: 03/17/2015    Discharge Condition: stable Diet recommendation: low fat, low sodium  Discharge Diagnoses:  Principal Problem:  Left flank pain/ Nephrolithiasis  Right upper quadrant pain-chronic cholecystitis  Essential hypertension  Atrial fibrillation, chronic  Warfarin-induced coagulopathy  Valvular heart disease  COPD (chronic obstructive pulmonary disease)  History of present illness:  -85 year old female who was discharged from the hospital on 6/17 after being treated for a COPD exacerbation. She returned to the hospital on 6/18 with severe left-sided flank pain. The pain eventually moved to left anterior abdomen and then the groin and then resolved. A CT scan of the abdomen and pelvis was done which revealed a forniceal rupture and a 3 mm calculus at the left UV junction or just inside the bladder. This was consistent with her presentation and at this time she has likely passed the stone. The CT scan above incidentally found chronic large gallstone. Further imaging with the ultrasound revealed acute cholecystitis with a 2.6 cm gallstone and possible additional stones. The patient is admitted to intermittent right upper quadrant pain but no significant issues with vomiting or diarrhea.  Hospital Course:  Principal Problem:  Acute cholecystitis? -As mentioned above, CT scan revealed a large gallstone and ultrasound was suspicious of acute cholecystitis -HIDA negative for acute cholecystitis therefore she does not currently have acute cholecystitis however surgery does suspect she has a chronic  cholecystitis as she does complain of having intermittent right upper quadrant pain for the past couple of months -Surgery (Dr Excell Seltzer) recommending surgery at a later date for chronic cholecystitis and is asking for medical clearance for surgery  Active Problems: Left sided nephrolithiasis with forniceal rupture-discussed with urology -no further management necessary as her pain has resolved indicating passage of the stone  COPD flare -Was admitted for this and discharged on the 17th -has not been hypoxic or wheezing during the hospital stay but continues to have a cough with yellow sputum   Essential hypertension -Atenolol and Maxzide held on admission as she was bradycardic and has acute renal failure - ARF resolved- can resume Losartan   Atrial fibrillation, chronic -she was bradycardic-Atenolol was on hold - HR was in 50-60 range- had a-fib with HR in low 100s for almost 5 min this AM- will resume Atenolol at 12.5 mg daily- cont to hold Digoxin   Warfarin-induced coagulopathy -CHADS Vasc 4 -INR 3.57 on admission - Coumadin was hold for possible surgery- INR 1.49- no surgery planned for this admission- can go back on coumadin takes 2 mg on add days except Wed when she takes 1mg - advised to go back to 2 mg daily and have INR checked in 3 days by PCP   Moderate to severe mitral regurgitation -Unchanged from prior echo in 2015   03/24/2015 1st  Pulmonary office visit/ Catherine [name] / pre op consultation  Chief Complaint  Patient presents with  . HFU    Breathing has improved. Her cough has improved some, but she still occ coughs up yellow sputum.   at baseline using saba twice  weekly then yardwrok on   03/07/15  then hackiing 6/11 and admit   03/11/15 with copd/ab  Still some am cough but otherwise back to baseline on symb 80 2bid  Not limited by breathing from desired activities  rec Continue symbicort 80 Take 2 puffs first thing in am and then another 2 puffs about 12 hours later  indefinitely since you had an attack and proair didn't help Only use your albuterol (proair) as a rescue medication You are cleared for gall bladder surgery > did  Fine     07/29/2016  Pulmonary consultation /Catherine [name] re: unexplained sob in pt with MS/ GOLD II criteria copd  Chief Complaint  Patient presents with  . Pulmonary Consult    Referred by Dr. Oval Linsey. Pt states that she is going to have mitral valve replacement soon. She c/o increased SOB since her last visit in 2016. She gets SOB doing housework.     progressive doe x year not able to trigger hfa effectively (at least a one second delay)  Previously able to do half mile on a treadmill  X  3.5 mph and flat now  2.2 x 0.2 of a mile then stops due to sob occ orthopnea/ maybe once a month Always recovers within a few minutes   rec Plan A = Automatic = Symbicort 80 Take 2 puffs first thing in am and then another 2 puffs about 12 hours later.  Work on inhaler technique:  - late add  If really needs hfa may be required to use spacer as could never trigger the mdi at onset of insp, only > 1 sec prior Plan B = Backup Only use your albuterol as rescue    Oct 14 2016  MVR [name]> marked improvement in doe   Phone call  12/16/17 Dr MR She saw ppc Catherine Post, MD 12/13/17 for fatigue She called cardiology 12/16/2017 for dyspnea for a week and then asked to call here  Please take prednisone 40 mg x1 day, then 30 mg x1 day, then 20 mg x1 day, then 10 mg x1 day, and then 5 mg x1 day and stop   -     12/20/2017 acute extended ov/Catherine [name] re:  ? aecopd  Chief Complaint  Patient presents with  . Acute Visit    Increased SOB since 12/10/17.  She states she gets SOB "anytime" sometimes with just talking. She was prescribe pred taper and this helped some.   sob at rest s relief from albuterol  "like my insides are quivering"  - symptoms better with walking (see walk today)  Some better with prednisone No cough/ some sensation of strangling on  water  Changed from full diazepam 5 mg at hs to one half around 3 weeks prior to onset of "quivering" and has really not slept well at all since this change was made ? Around 11/24/17  - not having noct cough / wheeze or early am exac rec Try back on a whole diazepam now and one at bedtime just for a week to see to what extent your problem improves and if worsens again on the half pill after that and if so return to see Dr Elease Hashimoto and if not return here with all meds in hand  Plan A = Automatic = symbicort 80 Take 2 puffs first thing in am and then another 2 puffs about 12 hours later.  Try adding prilosec otc 20mg   Take 30-60 min before first meal of the day and Pepcid  ac (famotidine) 20 mg one @  bedtime  X 4 week trial  GERD diet   Plan B = Backup Only use your albuterol as a rescue medication  Dec 2020 covid > [facility] > no change meds/ no 02    01/08/2020  f/u ov/Catherine [name] re:  GOLD II copd on symbicort 80 2bid  Chief Complaint  Patient presents with  . Follow-up    Breathing is doing well today. She has been doing some walking outside. She uses her albuterol 1 x per wk on average.   Dyspnea:  Improving with minimal need for saba  Cough:none Sleeping: slt elevation / thick pillow  SABA use: rare 02: none    No obvious day to day or daytime variability or assoc excess/ purulent sputum or mucus plugs or hemoptysis or cp or chest tightness, subjective wheeze or overt sinus or hb symptoms.   Sleeping as above without nocturnal  or early am exacerbation  of respiratory  c/o's or need for noct saba. Also denies any obvious fluctuation of symptoms with weather or environmental changes or other aggravating or alleviating factors except as outlined above   No unusual exposure hx or h/o childhood pna/ asthma or knowledge of premature birth.  Current Allergies, Complete Past Medical History, Past Surgical History, Family History, and Social History were reviewed in [facility] record.  ROS  The following are not active complaints unless bolded Hoarseness, sore throat, dysphagia, dental problems, itching, sneezing,  nasal congestion or discharge of excess mucus or purulent secretions, ear ache,   fever, chills, sweats, unintended wt loss or wt gain, classically pleuritic or exertional cp,  orthopnea pnd or arm/hand swelling  or leg swelling, presyncope, palpitations, abdominal pain, anorexia, nausea, vomiting, diarrhea  or change in bowel habits or change in bladder habits, change in stools or change in urine, dysuria, hematuria,  rash, arthralgias, visual complaints, headache, numbness, weakness or ataxia or problems with walking or coordination,  change in mood or  memory.        Current Meds  Medication Sig  . acetaminophen (TYLENOL) 500 MG tablet Take 500 mg by mouth daily.   Marland Kitchen albuterol (PROVENTIL HFA;VENTOLIN HFA) 108 (90 Base) MCG/ACT inhaler Inhale 2 puffs into the lungs every 6 (six) hours as needed for wheezing or shortness of breath.  Marland Kitchen aspirin EC 81 MG EC tablet Take 1 tablet (81 mg total) by mouth daily.  . budesonide-formoterol (SYMBICORT) 80-4.5 MCG/ACT inhaler 2 PUFFS 2 TIMES A DAY (Patient taking differently: Inhale 2 puffs into the lungs 2 (two) times daily. )  . cephALEXin (KEFLEX) 250 MG capsule Take 1 capsule (250 mg total) by mouth daily.  . diazepam (VALIUM) 5 MG tablet TAKE ONE TABLET AT BEDTIME  . doxazosin (CARDURA) 4 MG tablet TAKE 1 TABLET DAILY. STOP LOSARTAN  . fluconazole (DIFLUCAN) 150 MG tablet TAKE 1 TABLET BY MOUTH ONCE FOR 1 DOSE  . furosemide (LASIX) 40 MG tablet TAKE 1 TABLET DAILY. MAY TAKE 1 EXTRA AS NEEDED FOR SWELLING (Patient taking differently: Take 40 mg by mouth daily. *May take one additional tablet daily as needed for swelling)  . metoprolol succinate (TOPROL-XL) 25 MG 24 hr tablet Take 1 tablet (25 mg total) by mouth daily.  . Multiple Vitamins-Minerals (HAIR/SKIN/NAILS/BIOTIN PO) Take 1 tablet by mouth  daily.  . ondansetron (ZOFRAN ODT) 4 MG disintegrating tablet Take 1 tablet (4 mg total) by mouth every 8 (eight) hours as needed for nausea or vomiting.  Marland Kitchen  OVER THE COUNTER MEDICATION Apply 1 application topically at bedtime as needed (Leg Pain). Magni-Life topical cream for leg pain.  . potassium chloride (KLOR-CON) 10 MEQ tablet Take 6 tablets by mouth daily  . rosuvastatin (CRESTOR) 5 MG tablet TAKE 1/2 TO 1 TABLET ON MONDAY AND FRIDAY AS TOLERATED  . warfarin (COUMADIN) 2 MG tablet Take 1-2 tablets (2-4 mg total) by mouth See admin instructions. Take 1 tablet daily except 2 on Mondays or AS DIRECTED BY ANTICOAGULATION CLINIC  90 DAY                   Objective:   Physical Exam    amb wf nad   01/08/2020        145  12/20/2017        149   07/29/2016       151  03/24/15 139 lb 12.8 oz (63.413 kg)  03/20/15 141 lb (63.957 kg)  03/16/15 143 lb (64.864 kg)      Vital signs reviewed  01/08/2020  - Note at rest 02 sats  95% on RA    HEENT : pt wearing mask not removed for exam due to covid - 19 concerns.   NECK :  without JVD/Nodes/TM/ nl carotid upstrokes bilaterally   LUNGS: no acc muscle use,  Min barrel  contour chest wall with bilateral  slightly decreased bs s audible wheeze and  without cough on insp or exp maneuvers and min  Hyperresonant  to  percussion bilaterally     CV:  RRR  no s3 or murmur or increase in P2, and trace pitting LLE edema   ABD:  soft and nontender with pos end  insp Hoover's  in the supine position. No bruits or organomegaly appreciated, bowel sounds nl  MS:   Nl gait/  ext warm without deformities, calf tenderness, cyanosis or clubbing No obvious joint restrictions   SKIN: warm and   chronic venous stasis changes L LE   NEURO:  alert, approp, nl sensorium with  no motor or cerebellar deficits apparent.         pCXR 09/03/2019 reviewed: No acute dz        Assessment:

## 2020-01-08 NOTE — Patient Instructions (Addendum)
No change in respiratory medications  - if doing great ok to reduce symbicort 80 to one twice daily    Please schedule a follow up visit in 6  months but call sooner if needed

## 2020-01-10 ENCOUNTER — Other Ambulatory Visit: Payer: Self-pay

## 2020-01-10 ENCOUNTER — Encounter: Payer: Self-pay | Admitting: Cardiovascular Disease

## 2020-01-10 ENCOUNTER — Ambulatory Visit: Payer: Medicare Other | Admitting: Cardiovascular Disease

## 2020-01-10 ENCOUNTER — Encounter: Payer: Self-pay | Admitting: Internal Medicine

## 2020-01-10 VITALS — BP 142/80 | HR 56 | Temp 96.6°F | Ht 63.0 in | Wt 146.8 lb

## 2020-01-10 DIAGNOSIS — Z951 Presence of aortocoronary bypass graft: Secondary | ICD-10-CM

## 2020-01-10 DIAGNOSIS — I099 Rheumatic heart disease, unspecified: Secondary | ICD-10-CM

## 2020-01-10 DIAGNOSIS — I10 Essential (primary) hypertension: Secondary | ICD-10-CM | POA: Diagnosis not present

## 2020-01-10 DIAGNOSIS — I5032 Chronic diastolic (congestive) heart failure: Secondary | ICD-10-CM

## 2020-01-10 DIAGNOSIS — E785 Hyperlipidemia, unspecified: Secondary | ICD-10-CM

## 2020-01-10 DIAGNOSIS — I4891 Unspecified atrial fibrillation: Secondary | ICD-10-CM

## 2020-01-10 NOTE — Patient Instructions (Signed)
Medication Instructions:  Your physician recommends that you continue on your current medications as directed. Please refer to the Current Medication list given to you today.  *If you need a refill on your cardiac medications before your next appointment, please call your pharmacy*   Lab Work: NONE If you have labs (blood work) drawn today and your tests are completely normal, you will receive your results only by: . MyChart Message (if you have MyChart) OR . A paper copy in the mail If you have any lab test that is abnormal or we need to change your treatment, we will call you to review the results.   Testing/Procedures: NONE   Follow-Up: At [facility], you and your health needs are our priority.  As part of our continuing mission to provide you with exceptional heart care, we have created designated Provider Care Teams.  These Care Teams include your primary Cardiologist (physician) and Advanced Practice Providers (APPs -  Physician Assistants and Nurse Practitioners) who all work together to provide you with the care you need, when you need it.  We recommend signing up for the patient portal called "MyChart".  Sign up information is provided on this After Visit Summary.  MyChart is used to connect with patients for Virtual Visits (Telemedicine).  Patients are able to view lab/test results, encounter notes, upcoming appointments, etc.  Non-urgent messages can be sent to your provider as well.   To learn more about what you can do with MyChart, go to [email or web address].    Your next appointment:   6 month(s)  The format for your next appointment:   In Person  Provider:   You may see Tiffany [place], MD or one of the following Advanced Practice Providers on your designated Care Team:    Luke Kilroy, PA-C  Callie Goodrich, PA-C  Jesse Cleaver, FNP     

## 2020-01-10 NOTE — Assessment & Plan Note (Signed)
Quit smoking 1977 - Spirometry 03/24/15  FEV1  1.41 (70%) ratio 65  - 03/26/2015  Walked RA x 3 laps @ 185 ft each stopped due to  End of study, brisk pace, no sob or desat  - Spirometry 07/29/2016  FEV1 1.30 (66%)  Ratio 66   - Spirometry 12/20/2017  FEV1 1.34 (69%)  Ratio 71 with min curvature p am symb 80 and saba w/in 1 hour - 12/20/2017  Walked RA x 3 laps @ 185 ft each stopped due to  End of study, moderately fast pace, no sob or desat     - 01/08/2020  After extensive coaching inhaler device,  effectiveness =   90%  Doing great on symb 80 2bid with more of an AB pattern than typical copd so fine to reduce the symb to 80 1-2 bid Based on two studies from NEJM  378; 20 p 1865 (2018) and 380 : p2020-30 (2019) in pts with mild asthma it is reasonable to use low dose symbicort eg 80 2bid "prn" flare in this setting but I emphasized this was only shown with symbicort and takes advantage of the rapid onset of action but is not the same as "rescue therapy" but can be stopped once the acute symptoms have resolved and the need for rescue has been minimized (< 2 x weekly)     >>> f/u can by yearly, sooner prn          Each maintenance medication was reviewed in detail including emphasizing most importantly the difference between maintenance and prns and under what circumstances the prns are to be triggered using an action plan format where appropriate.  Total time for H and P, chart review, counseling, teaching device and generating customized AVS unique to this office visit / charting = 20 min

## 2020-01-10 NOTE — Progress Notes (Signed)
Cardiology Office Note   Date:  01/10/2020   ID:  Catherine [name], DOB Jun 27, 1935, MRN [id number]  PCP:  [name], MD  Cardiologist:   [name], MD  Cardiothoracic surgeon: Dr. Roxy Manns  No chief complaint on file.    History of Present Illness: Catherine [name] is a 85 y.o. 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 Catherine [name] was previously a patient of [name]. She underwent mitral commissurotomy in the 1970s. On her echocardiogram 02/2014 she had an ejection fraction of 60-65% with moderate to severe mitral regurgitation. There was also mild aortic regurgitation. Her follow-up echo 02/2015 showed an EF of 60-65% with moderate to severe mitral regurgitation, moderate tricuspid regurgitation and elevated pulmonary artery pressures.  Repeat echo 9/11/7 revealed moderate pulmonary stenosis with moderate to severe mitral regurgitation.  She did not have any pulmonary hypertension and her LV was not dilated.  She was referred for left and right heart catheterization. She was found to have severe mitral stenosis with a 13 mmHg mean gradient, MVA 0.99 cm^2. She also had 50% LCx and 80% RCA lesions.  She had a 29 mm bioprosthetic mitral valve, single vessel CABG and MAZE with Dr. Roxy Manns on 10/14/16.  She was referred for a baseline echo 02/15/17 that revealed LVEF 60-65% with mild aortic stenosis (mean gradient 12 mmHg).  Her bioprosthetic mitral valve was functioning well.    After her surgery Catherine [name] continued to report fatigue.  It was unclear whether she was in atrial fibrillation or sinus rhythm with a long PR interval.  She was referred to the atrial fibrillation clinic where this remained unclear.  However, it was felt that if she were in atrial fibrillation it would not be possible to maintain sinus rhythm.  She developed  lower extremity edema. She stopped amlodipine and this improved. This was switched to irbesartan. She then developed diaphoresis and insomnia that she attributed to this medication.    In April 2020 Catherine [name] fell on the treadmill.  She had severe tearing of the skin on her leg.  She had a lot of swelling and has been in termendous pain.  She had in home nursing that ended because she had COVID in November.  She got very sick and required infection.  Since then she continues to be very fatigued.  She had very bad symptoms with both vaccines.  She had cellulitis in the L leg and still has residual swelling in the leg.  She tries to keep it elevated and wears compression stockings.  Her breathing has been good but she is still fatigued.  She has no chest pain.  She has started walking more in her driveway for exercise. She is frustrated that her hair has been falling out since COVID.   Past Medical History:  Diagnosis Date  . Aortic stenosis, mild 07/26/2017  . Arthritis   . Asthma    last attack 02/2015  . Atrial fibrillation, chronic ([place])   . Cellulitis of left lower extremity 08/17/2016   Ulcer associated with severe venous insufficiency  . Chronic anticoagulation   . Chronic diastolic CHF (congestive heart failure) ([place])   . Chronic kidney disease    "RIGHT MANY KIDNEY INFECTIONS AND STONES"  . COPD (chronic obstructive pulmonary disease) ([place])   . Coronary artery disease   . Dizziness   . H/O: rheumatic  fever   . Heart murmur   . Hypertension   . PONV (postoperative nausea and vomiting)    ' SOMETIMES', BUT NOT ALWAYS"  . S/P Maze operation for atrial fibrillation 10/14/2016   Complete bilateral atrial lesion set using cryothermy and bipolar radiofrequency ablation - atrial appendage was not treated due to previous surgical procedure (open mitral commissurotomy)  . S/P mitral valve replacement with bioprosthetic valve 10/14/2016   29 mm Medtronic Mosaic porcine bioprosthetic tissue  valve  . UTI (urinary tract infection)   . Valvular heart disease    Has mitral stenosis with prior mitral commissurotomy in 1970    Past Surgical History:  Procedure Laterality Date  . ABDOMINAL HYSTERECTOMY  1983   endometriosis  . APPENDECTOMY    . BACK SURGERY     neurosurgery x2  . CARDIAC CATHETERIZATION    . CARDIAC CATHETERIZATION N/A 08/03/2016   Procedure: Right/Left Heart Cath and Coronary Angiography;  Surgeon: Peter M Martinique, MD;  Location: [facility];  Service: Cardiovascular;  Laterality: N/A;  . cataract surg    . CHOLECYSTECTOMY N/A 05/30/2015   Procedure: LAPAROSCOPIC CHOLECYSTECTOMY WITH INTRAOPERATIVE CHOLANGIOGRAM;  Surgeon: Excell Seltzer, MD;  Location: [facility];  Service: General;  Laterality: N/A;  . COLONOSCOPY    . CORONARY ARTERY BYPASS GRAFT N/A 10/14/2016   Procedure: CORONARY ARTERY BYPASS GRAFTING (CABG);  Surgeon: Rexene Alberts, MD;  Location: [facility];  Service: Open Heart Surgery;  Laterality: N/A;  . EYE SURGERY    . MAZE N/A 10/14/2016   Procedure: MAZE;  Surgeon: Rexene Alberts, MD;  Location: [facility];  Service: Open Heart Surgery;  Laterality: N/A;  . MITRAL VALVE REPLACEMENT N/A 10/14/2016   Procedure: REDO MITRAL VALVE REPLACEMENT (MVR);  Surgeon: Rexene Alberts, MD;  Location: [facility];  Service: Open Heart Surgery;  Laterality: N/A;  . MITRAL VALVE SURGERY Left 1970   Open mitral commissurotomy via left thoracotomy approach  . TEE WITHOUT CARDIOVERSION N/A 07/05/2016   Procedure: TRANSESOPHAGEAL ECHOCARDIOGRAM (TEE);  Surgeon: [name], MD;  Location: [facility];  Service: Cardiovascular;  Laterality: N/A;  . TEE WITHOUT CARDIOVERSION N/A 10/14/2016   Procedure: TRANSESOPHAGEAL ECHOCARDIOGRAM (TEE);  Surgeon: Rexene Alberts, MD;  Location: [facility];  Service: Open Heart Surgery;  Laterality: N/A;    Current Outpatient Medications  Medication Sig Dispense Refill  . acetaminophen (TYLENOL) 500 MG tablet Take 500 mg by mouth daily.       Marland Kitchen albuterol (PROVENTIL HFA;VENTOLIN HFA) 108 (90 Base) MCG/ACT inhaler Inhale 2 puffs into the lungs every 6 (six) hours as needed for wheezing or shortness of breath. 1 Inhaler 0  . aspirin EC 81 MG EC tablet Take 1 tablet (81 mg total) by mouth daily.    . budesonide-formoterol (SYMBICORT) 80-4.5 MCG/ACT inhaler 2 PUFFS 2 TIMES A DAY (Patient taking differently: Inhale 2 puffs into the lungs 2 (two) times daily. ) 10.2 g 6  . cephALEXin (KEFLEX) 250 MG capsule Take 1 capsule (250 mg total) by mouth daily. 30 capsule 3  . diazepam (VALIUM) 5 MG tablet TAKE ONE TABLET AT BEDTIME 30 tablet 5  . doxazosin (CARDURA) 4 MG tablet TAKE 1 TABLET DAILY. STOP LOSARTAN 90 tablet 0  . fluconazole (DIFLUCAN) 150 MG tablet TAKE 1 TABLET BY MOUTH ONCE FOR 1 DOSE 1 tablet 0  . furosemide (LASIX) 40 MG tablet TAKE 1 TABLET DAILY. MAY TAKE 1 EXTRA AS NEEDED FOR SWELLING (Patient taking differently: Take 40 mg by mouth  daily. *May take one additional tablet daily as needed for swelling) 60 tablet 6  . metoprolol succinate (TOPROL-XL) 25 MG 24 hr tablet Take 1 tablet (25 mg total) by mouth daily. 30 tablet 5  . Multiple Vitamins-Minerals (HAIR/SKIN/NAILS/BIOTIN PO) Take 1 tablet by mouth daily.    . ondansetron (ZOFRAN ODT) 4 MG disintegrating tablet Take 1 tablet (4 mg total) by mouth every 8 (eight) hours as needed for nausea or vomiting. 15 tablet 0  . OVER THE COUNTER MEDICATION Apply 1 application topically at bedtime as needed (Leg Pain). Magni-Life topical cream for leg pain.    . potassium chloride (KLOR-CON) 10 MEQ tablet Take 6 tablets by mouth daily 540 tablet 1  . rosuvastatin (CRESTOR) 5 MG tablet TAKE 1/2 TO 1 TABLET ON MONDAY AND FRIDAY AS TOLERATED 20 tablet 0  . warfarin (COUMADIN) 2 MG tablet Take 1-2 tablets (2-4 mg total) by mouth See admin instructions. Take 1 tablet daily except 2 on Mondays or AS DIRECTED BY ANTICOAGULATION CLINIC  90 DAY 105 tablet 1   No current facility-administered  medications for this visit.    Allergies:   Aldactone [spironolactone], Amoxicillin, Diltiazem, Flagyl [metronidazole hcl], Flovent [fluticasone propionate], Gabapentin, Lyrica [pregabalin], Quinidine, Simvastatin, Tramadol, Verapamil, Amlodipine, Zetia [ezetimibe], Ace inhibitors, Benazepril hcl, Ciprocin-fluocin-procin [fluocinolone acetonide], Ciprofloxacin, Codeine, and Nitrofurantoin monohyd macro    Social History:  The patient  reports that she quit smoking about 44 years ago. Her smoking use included cigarettes. She has a 15.00 pack-year smoking history. She has never used smokeless tobacco. She reports that she does not drink alcohol or use drugs.   Family History:  The patient's family history includes Leukemia in her father.    ROS:  Please see the history of present illness.   Otherwise, review of systems are positive for chest wall soreness.   All other systems are reviewed and negative.    PHYSICAL EXAM: VS:  BP (!) 142/80   Pulse (!) 56   Temp (!) 96.6 F (35.9 C)   Ht 5\' 3"  (1.6 m)   Wt 146 lb 12.8 oz (66.6 kg)   SpO2 93%   BMI 26.00 kg/m  , BMI Body mass index is 26 kg/m. GENERAL:  Well appearing HEENT: Pupils equal round and reactive, fundi not visualized, oral mucosa unremarkable NECK:  No jugular venous distention, waveform within normal limits, carotid upstroke brisk and symmetric, no bruits, no thyromegaly LYMPHATICS:  No cervical adenopathy LUNGS:  Clear to auscultation bilaterally HEART:  Bradycardic.  Irregularly irregular.  PMI not displaced or sustained,S1 and S2 within normal limits, no S3, no S4, no clicks, no rubs, III/VI systolic murmur at the LUSB ABD:  Flat, positive bowel sounds normal in frequency in pitch, no bruits, no rebound, no guarding, no midline pulsatile mass, no hepatomegaly, no splenomegaly EXT:  2 plus pulses throughout, 1+ LLE edema, no cyanosis no clubbing SKIN: L LE erythema and chronic stasis dermatitis NEURO:  Cranial nerves II  through XII grossly intact, motor grossly intact throughout PSYCH:  Cognitively intact, oriented to person place and time   EKG:  EKG is ordered today. The ekg ordered 05/21/16 demonstrates atrial fibrillateion.  Prior septal infarct.  Rate 60 bpm.  Unchanged from 10/22/15. 10/27/16: Sinus rhythm.  Rate 60 bpm.  First degree heart block. 02/01/17: Sinus bradycardia with sinus arrhythmia and first degree block.   03/08/17: Sinus bradycardia with sinus arrhythmia.   First degree heart block.  Junctional escape.  QTc 497 ms.   04/26/17:  Atrial fibrillation.  Rate 69 bpm. 01/05/18: Accelerated junctional rhythm.  Rate 68 bpm.  Incomplete RBBB.  10/10/18: Sinus rhythm.  Rate 89 bpm.  Nonspecific ST changes.  06/06/19: Atrial fibrillation.  Rate 61 bpm.  Prior septal infarct. 01/10/20: Atrial fibrillation. Rate 56 bpm.  Prior septal infarct.  Echo 07/06/18: Study Conclusions  - Left ventricle: The cavity size was normal. Systolic function was   vigorous. The estimated ejection fraction was in the range of 65%   to 70%. Wall motion was normal; there were no regional wall   motion abnormalities. Doppler parameters are consistent with high   ventricular filling pressure. - Aortic valve: Trileaflet; mildly thickened, mildly calcified   leaflets. There was mild stenosis. Peak velocity (S): 266 cm/s.   Mean gradient (S): 16 mm Hg. - Mitral valve: A bioprosthesis was present and functioning   normally. - Left atrium: The atrium was severely dilated. Anterior-posterior   dimension: 60 mm. - Tricuspid valve: There was mild regurgitation.  LHC/RHC 08/03/16: The left ventricular systolic function is normal.  LV end diastolic pressure is normal.  The left ventricular ejection fraction is 55-65% by visual estimate.  There is no aortic valve stenosis.  There is severe mitral valve stenosis.  Prox Cx to Mid Cx lesion, 50 %stenosed.  Mid RCA-2 lesion, 80 %stenosed.  Mid RCA-1 lesion, 80  %stenosed.  Hemodynamic findings consistent with mild pulmonary hypertension.  LV end diastolic pressure is normal.   1. Coronary artery disease   - 50% mid LCx   - 80% sequential lesions in the mid RCA 2. Normal LV function 3. Severe mitral stenosis. MV gradient of 13 mm Hg. MVA 0.99 cm squared with index 0.57. 4. Moderate to severe mitral insufficiency 5. Mild pulmonary HTN. 6. Normal LV EDP 7. No significant AV gradient 8. Occluded right brachial artery.  Carotid Dopplers 10/12/16: 1-39% bilateral ICA stenosis  7 Day Event Monitor 06/28/17:  Quality: Fair.  Baseline artifact. Predominant rhythm: Sinus rhythm with PACs and sinus arrhythmia.    Cannot  rule out episodes of atrial fibrillation.  Recent Labs: 09/05/2019: Magnesium 2.6 09/24/2019: ALT 22; Hemoglobin 11.0; Platelets 133.0 10/01/2019: BUN 8; Creatinine, Ser 0.81; Potassium 3.6; Sodium 145    Lipid Panel    Component Value Date/Time   CHOL 163 09/24/2019 1054   CHOL 222 (H) 06/06/2019 1116   TRIG 214.0 (H) 09/24/2019 1054   HDL 52.70 09/24/2019 1054   HDL 53 06/06/2019 1116   CHOLHDL 3 09/24/2019 1054   VLDL 42.8 (H) 09/24/2019 1054   LDLCALC 135 (H) 06/06/2019 1116   LDLDIRECT 68.0 09/24/2019 1054      Wt Readings from Last 3 Encounters:  01/10/20 146 lb 12.8 oz (66.6 kg)  01/08/20 145 lb 12.8 oz (66.1 kg)  11/02/19 150 lb 11.2 oz (68.4 kg)     ASSESSMENT AND PLAN:  # Rheumatic mitral valve disease: # s/p bioprosthetic mitral valve: Catherine [name] is doing well from a heart standpoint.  She is still struggling to recover from [place] and her fall.  She has no evidence of heart failure on exam.  Her volume status is well-controlled.  Continue furosemide.  She needs antibiotics prior to dental procedures.  # LLE Edema:  She has stasis dermatitis that is slowly improving.  # Mild aortic stenosis: Mean gradient 16 mmHg on 06/2018.  Repeat echo in January.  # Persistent atrial fibrillation:  #  Bradycardia: # Palpitations: Rate well-controlled.  Continue warfarin due to her valvular disease.  Check CBC.  # Hypertension:  Blood pressure is above goal.  She is going to work on getting back into her exercise routine.  Continue doxazosin, metoprolol, and furosemide.  # Nonobstructive coronary disease: # Hyperlipidemia:  Has not tolerated statins and muscle cramps on Zetia. She is on low dose rosuvastatin.   Current medicines are reviewed at length with the patient today.  The patient does not have concerns regarding medicines.  The following changes have been made:  none  Labs/ tests ordered today include:   No orders of the defined types were placed in this encounter.   Disposition:   FU with Lejon Afzal C. Oval Linsey, MD, [facility] in 6 months.    Signed, Rosella Crandell C. Oval Linsey, MD, [facility]  01/10/2020 6:11 PM    [place]

## 2020-01-15 ENCOUNTER — Other Ambulatory Visit: Payer: Self-pay

## 2020-01-15 IMAGING — MG DIGITAL SCREENING BILAT W/ CAD
4 series · 4 of 4 positions shown · non-contrast
Comparison: Previous exam(s).

CLINICAL DATA: Screening.

EXAM:
DIGITAL SCREENING BILATERAL MAMMOGRAM WITH CAD

[R MLO]
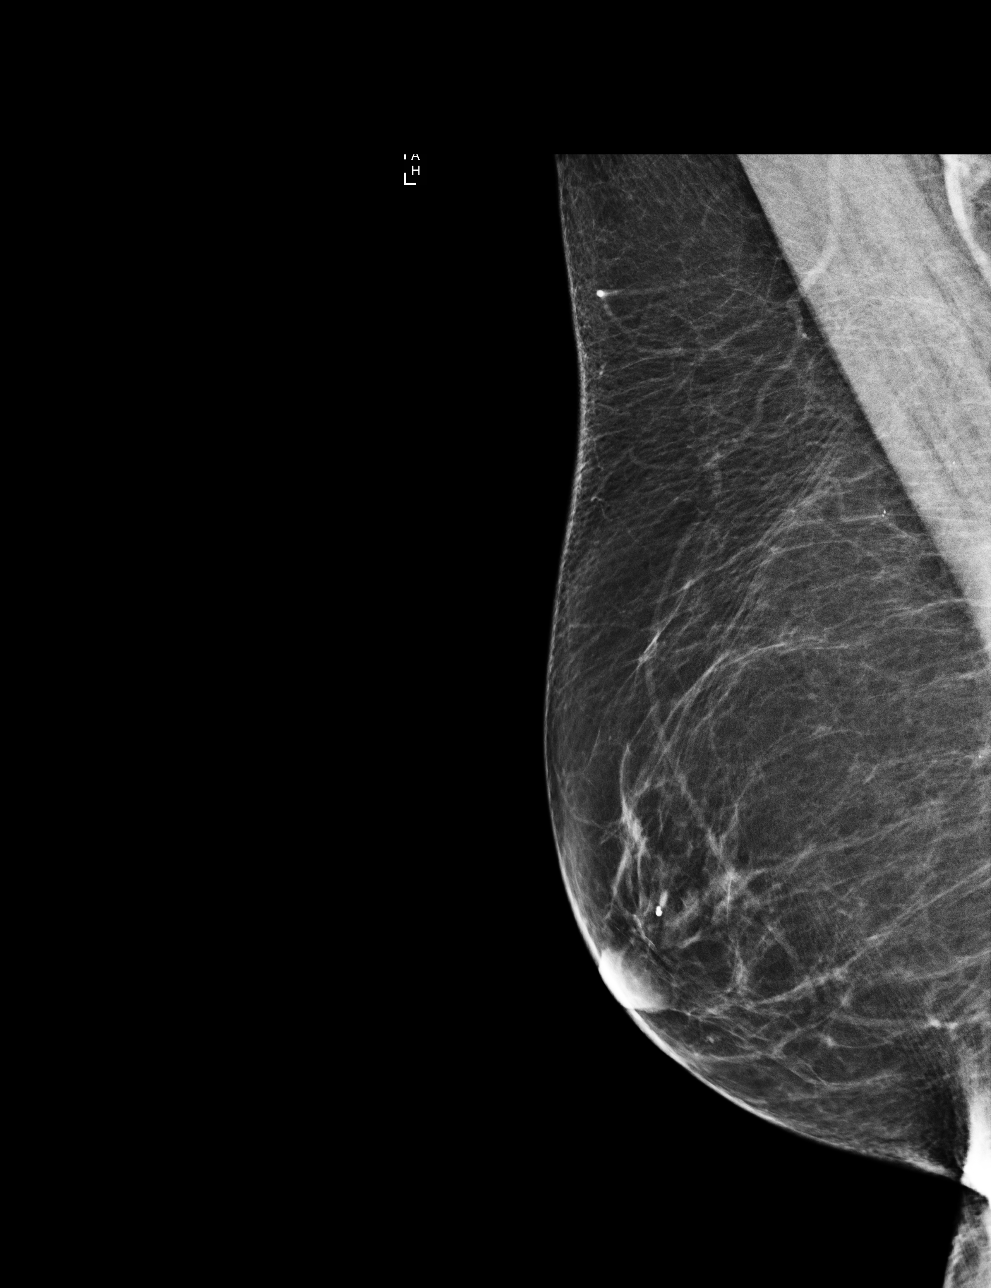

[L MLO]
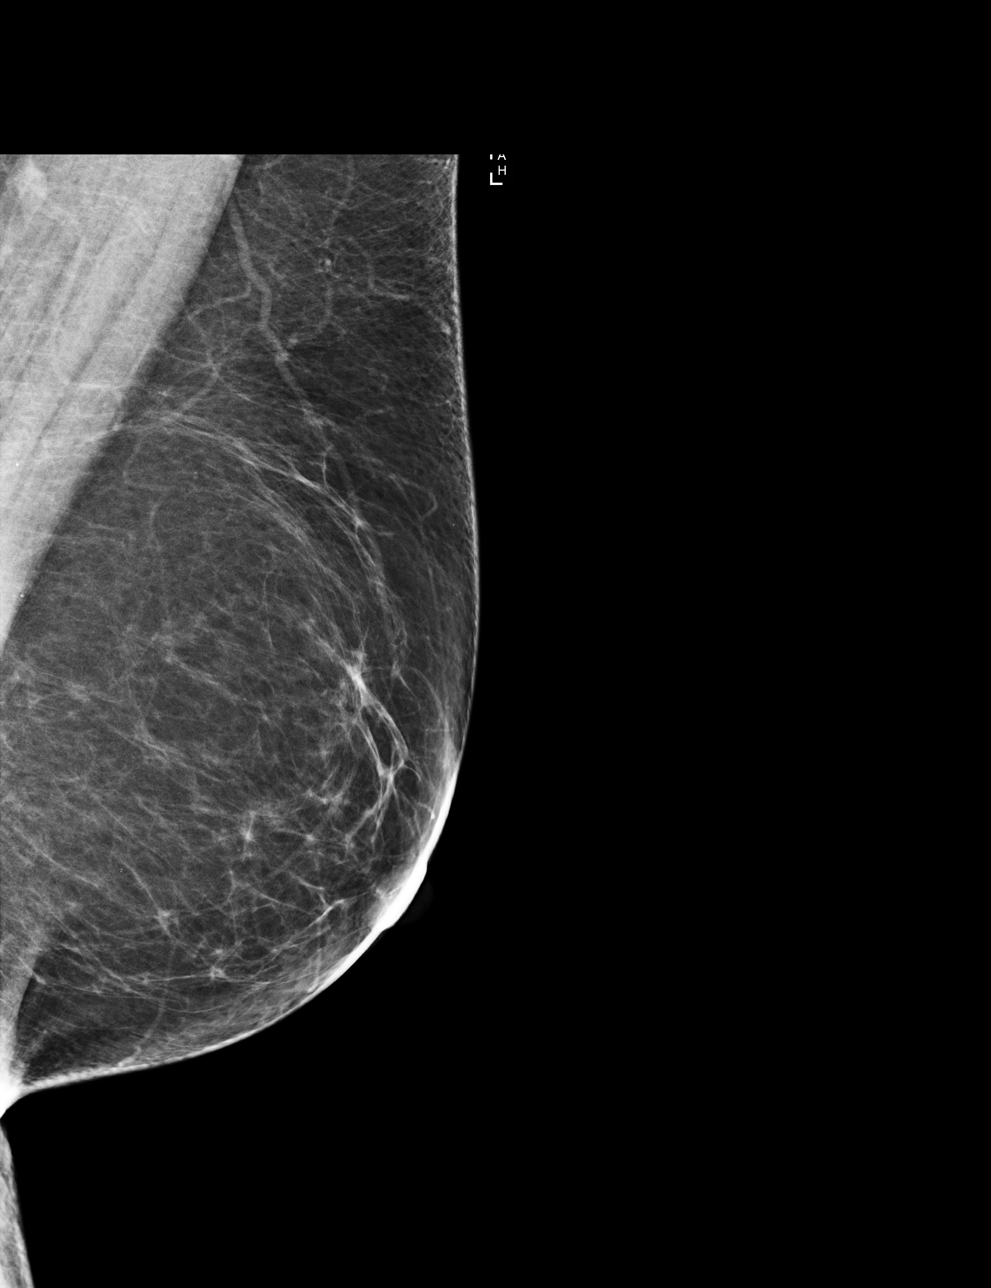

[L CC]
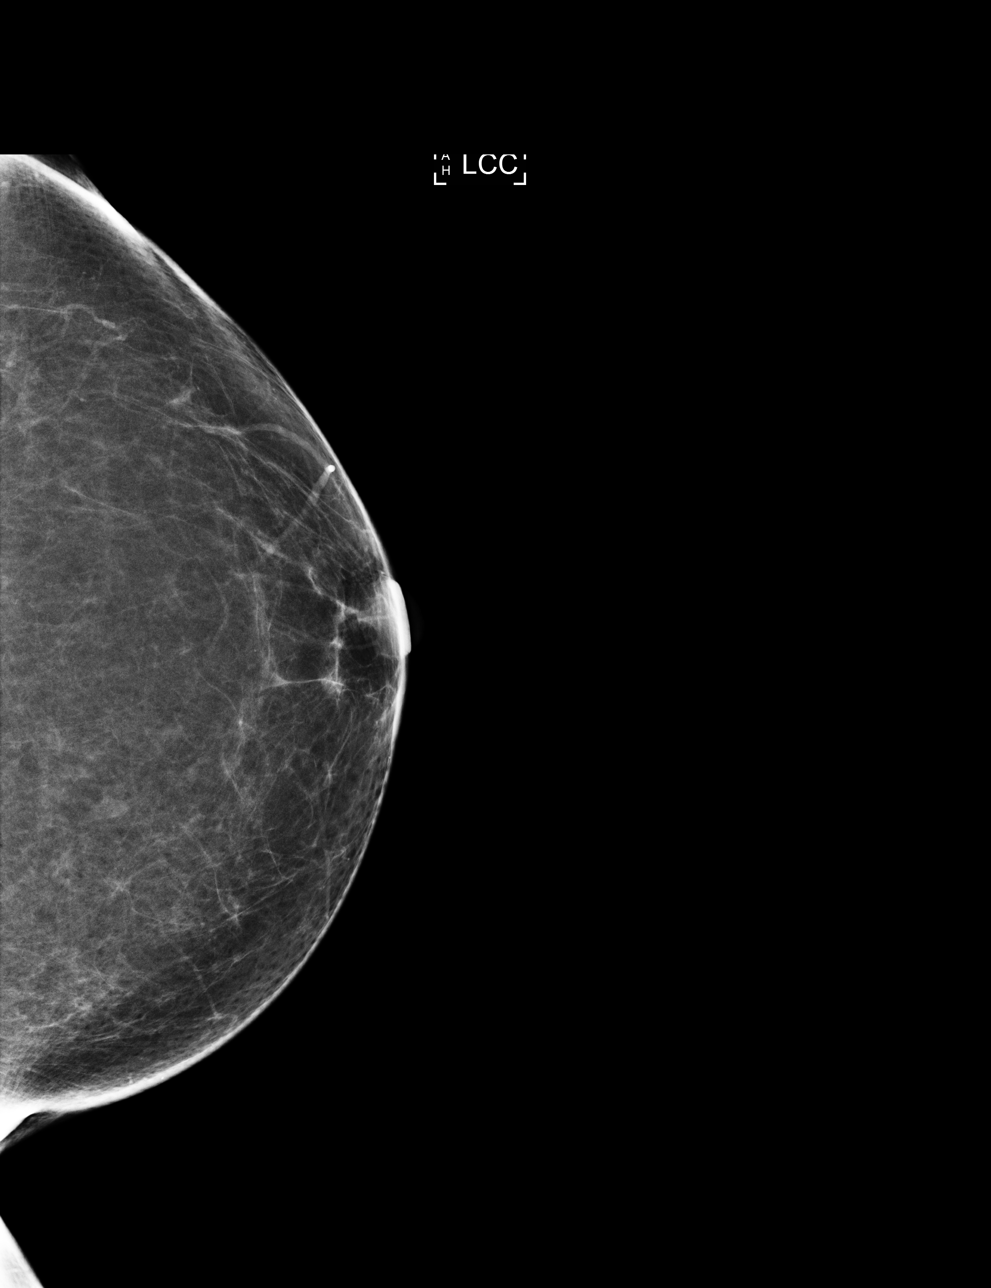

[R CC]
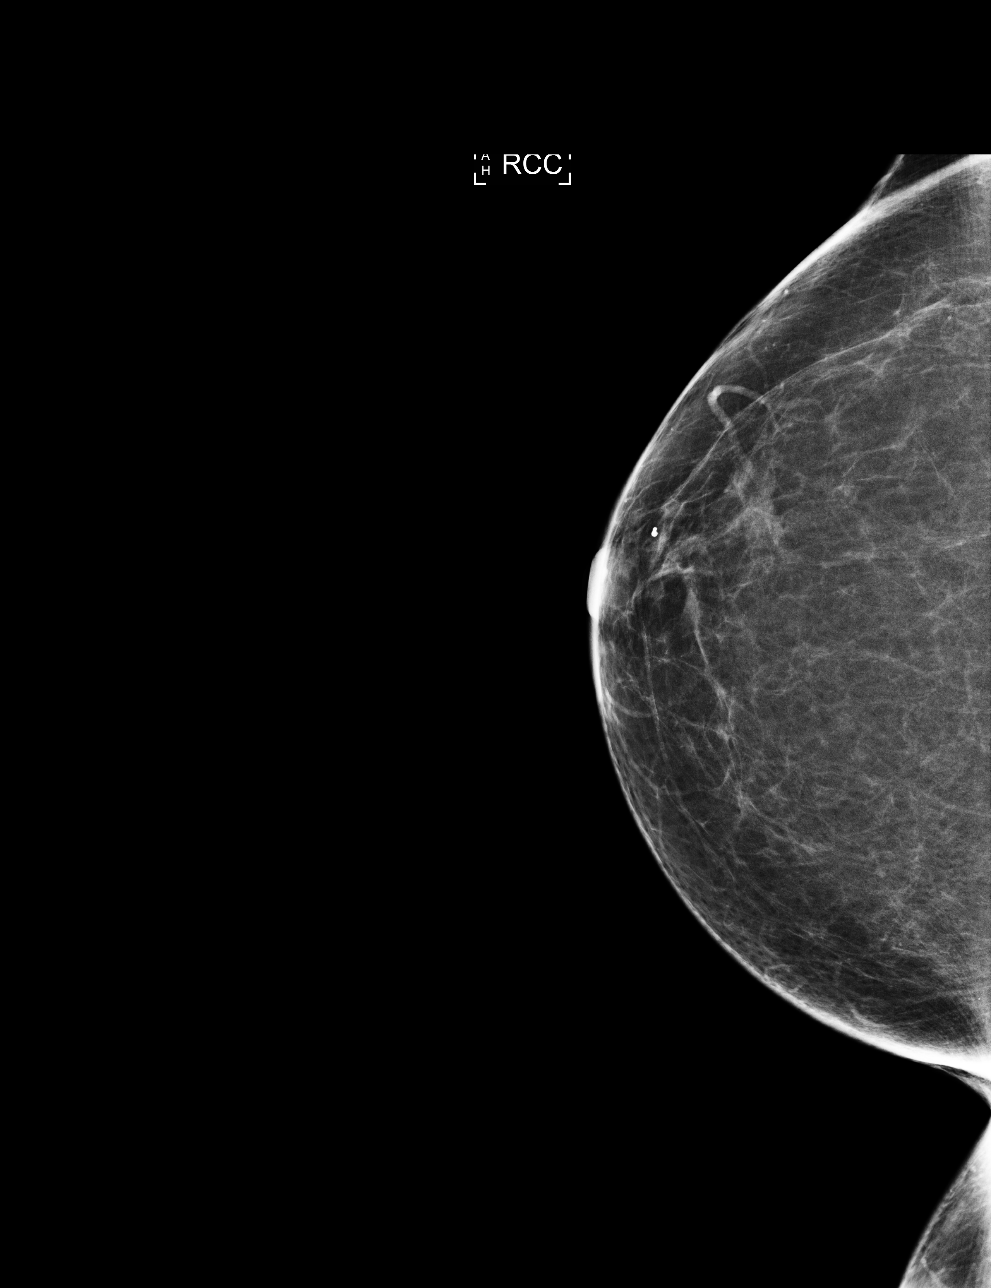

[4 of 4 positions shown; findings below may reference images not displayed]

ACR Breast Density Category b: There are scattered areas of
fibroglandular density.
FINDINGS: There are no findings suspicious for malignancy. Images were
processed with CAD.
IMPRESSION: No mammographic evidence of malignancy. A result letter of this
screening mammogram will be mailed directly to the patient.

RECOMMENDATION:
Screening mammogram in one year. (Code:AS-G-LCT)

BI-RADS CATEGORY  1: Negative.

## 2020-01-16 ENCOUNTER — Ambulatory Visit (INDEPENDENT_AMBULATORY_CARE_PROVIDER_SITE_OTHER): Payer: Medicare Other | Admitting: General Practice

## 2020-01-16 DIAGNOSIS — I4891 Unspecified atrial fibrillation: Secondary | ICD-10-CM | POA: Diagnosis not present

## 2020-01-16 DIAGNOSIS — I872 Venous insufficiency (chronic) (peripheral): Secondary | ICD-10-CM | POA: Diagnosis not present

## 2020-01-16 DIAGNOSIS — I8311 Varicose veins of right lower extremity with inflammation: Secondary | ICD-10-CM | POA: Diagnosis not present

## 2020-01-16 DIAGNOSIS — L308 Other specified dermatitis: Secondary | ICD-10-CM | POA: Diagnosis not present

## 2020-01-16 DIAGNOSIS — L570 Actinic keratosis: Secondary | ICD-10-CM | POA: Diagnosis not present

## 2020-01-16 DIAGNOSIS — I8312 Varicose veins of left lower extremity with inflammation: Secondary | ICD-10-CM | POA: Diagnosis not present

## 2020-01-16 LAB — POCT INR: INR: 2.5 (ref 2.0–3.0)

## 2020-01-16 NOTE — Patient Instructions (Signed)
Pre visit review using our clinic review tool, if applicable. No additional management support is needed unless otherwise documented below in the visit note.  Continue to take 1 tablet daily.  Re-check in 4 weeks.

## 2020-02-11 ENCOUNTER — Other Ambulatory Visit: Payer: Self-pay | Admitting: Cardiovascular Disease

## 2020-02-12 ENCOUNTER — Other Ambulatory Visit: Payer: Self-pay

## 2020-02-13 ENCOUNTER — Ambulatory Visit (INDEPENDENT_AMBULATORY_CARE_PROVIDER_SITE_OTHER): Payer: Medicare Other | Admitting: General Practice

## 2020-02-13 DIAGNOSIS — I4891 Unspecified atrial fibrillation: Secondary | ICD-10-CM

## 2020-02-13 LAB — POCT INR: INR: 2.3 (ref 2.0–3.0)

## 2020-02-13 NOTE — Patient Instructions (Signed)
Pre visit review using our clinic review tool, if applicable. No additional management support is needed unless otherwise documented below in the visit note.  Continue to take 1 tablet daily.  Re-check in 6 weeks.

## 2020-02-18 ENCOUNTER — Other Ambulatory Visit: Payer: Self-pay | Admitting: Internal Medicine

## 2020-02-19 ENCOUNTER — Other Ambulatory Visit: Payer: Self-pay | Admitting: Cardiovascular Disease

## 2020-02-21 NOTE — Telephone Encounter (Signed)
Rx has been sent to the pharmacy electronically. ° °

## 2020-02-28 ENCOUNTER — Other Ambulatory Visit: Payer: Self-pay | Admitting: Family Medicine

## 2020-02-29 ENCOUNTER — Other Ambulatory Visit: Payer: Self-pay | Admitting: Cardiovascular Disease

## 2020-02-29 NOTE — Telephone Encounter (Signed)
Please advise if okay to fill  

## 2020-03-02 NOTE — Telephone Encounter (Signed)
Refill for 3 months. 

## 2020-03-10 IMAGING — CT CT ABD-PELV W/ CM
2 of 5 series · 15 of 46 positions shown, 17 images · IV contrast (Omnipaque)
Comparison: None available.

CLINICAL DATA: Initial evaluation for acute nausea, vomiting.

EXAM:
CT ABDOMEN AND PELVIS WITH CONTRAST
TECHNIQUE: Multidetector CT imaging of the abdomen and pelvis was performed
using the standard protocol following bolus administration of
intravenous contrast.
CONTRAST:  80mL OMNIPAQUE IOHEXOL 300 MG/ML  SOLN

[Series 2: axial (person_name) (person_name) · axial · 0.77mm/px · z∈[+627,+1022]mm · 12 of 89 slices shown, 14 images]
[im 5/89  soft-tissue]
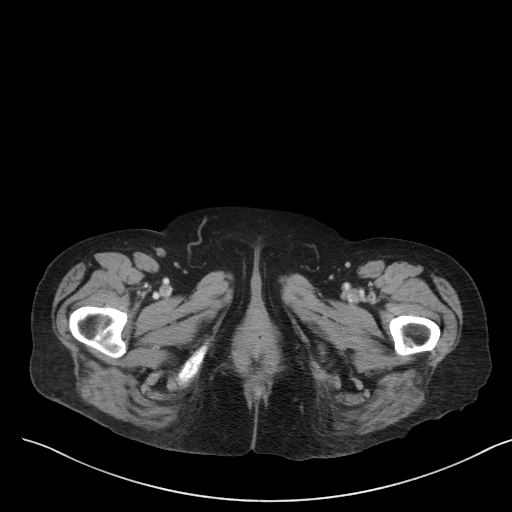
[im 5/89  bone]
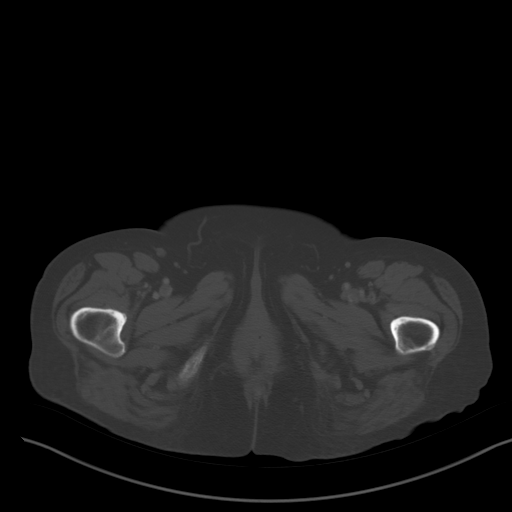
[im 14/89  soft-tissue]
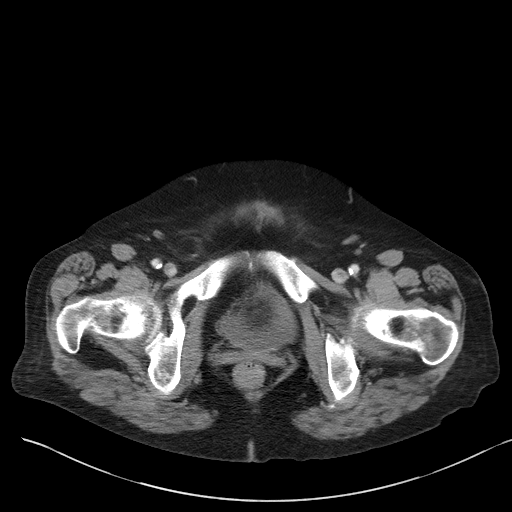
[im 19/89  soft-tissue]
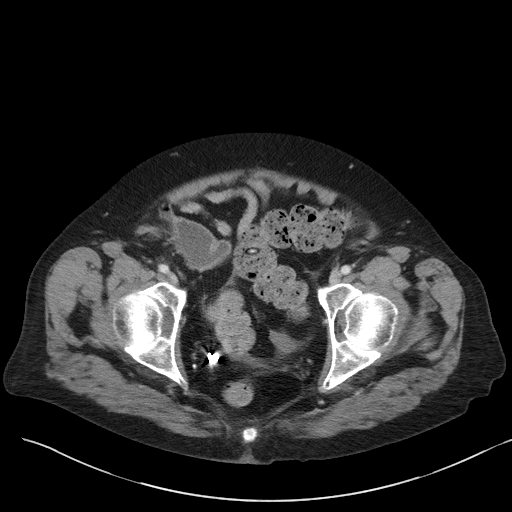
[im 28/89  soft-tissue]
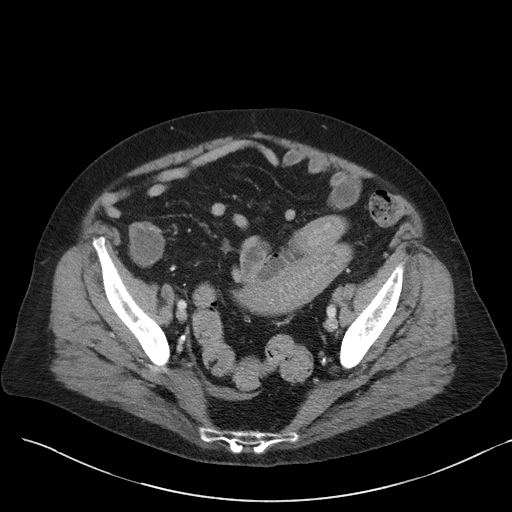
[im 33/89  soft-tissue]
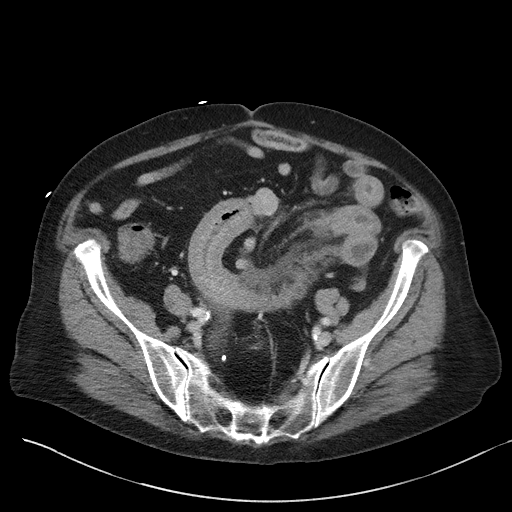
[im 42/89  soft-tissue]
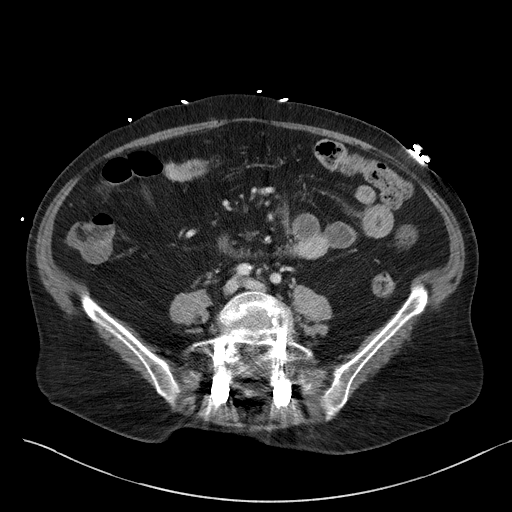
[im 47/89  soft-tissue]
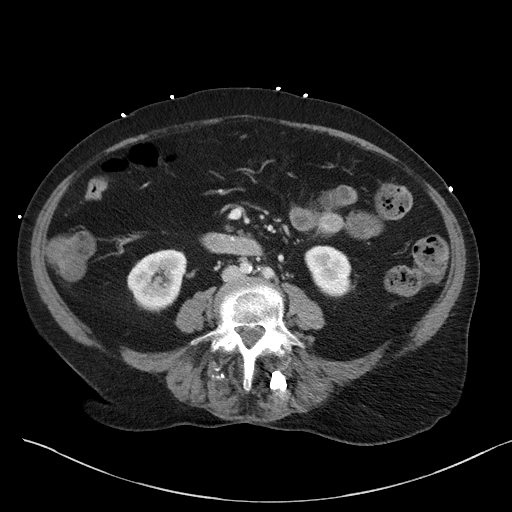
[im 56/89  soft-tissue]
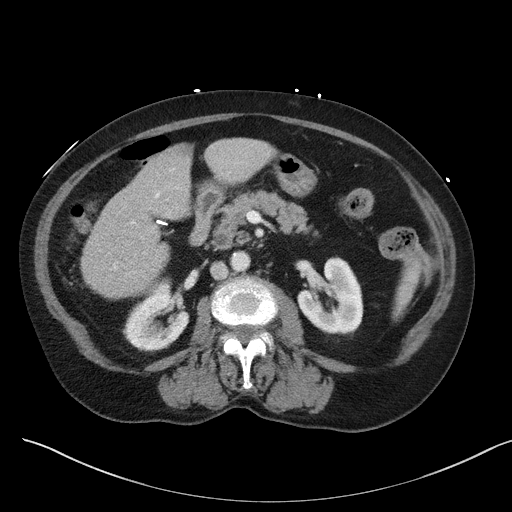
[im 61/89  soft-tissue]
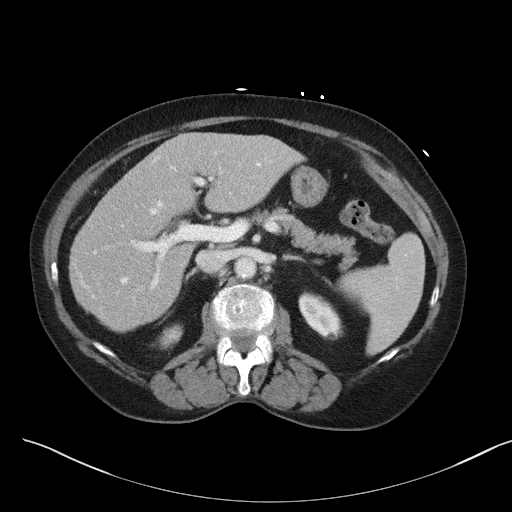
[im 61/89  bone]
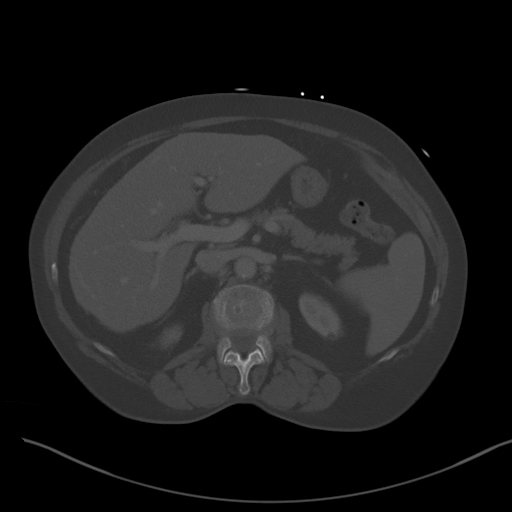
[im 70/89  soft-tissue]
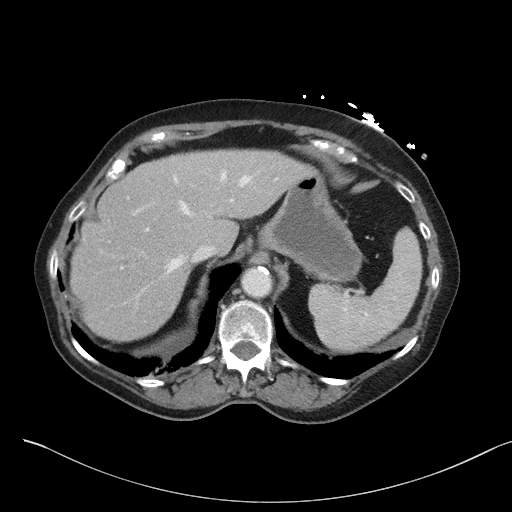
[im 75/89  soft-tissue]
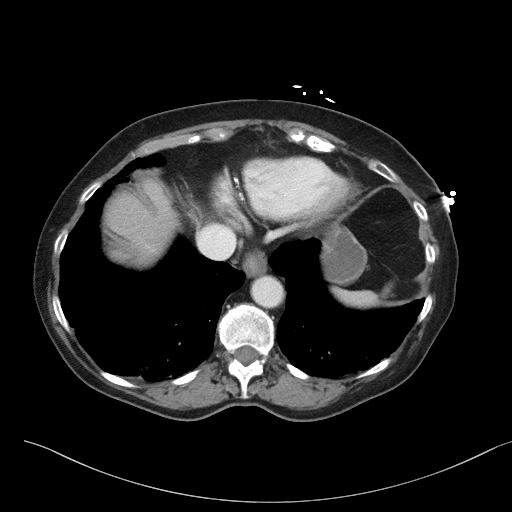
[im 84/89  soft-tissue]
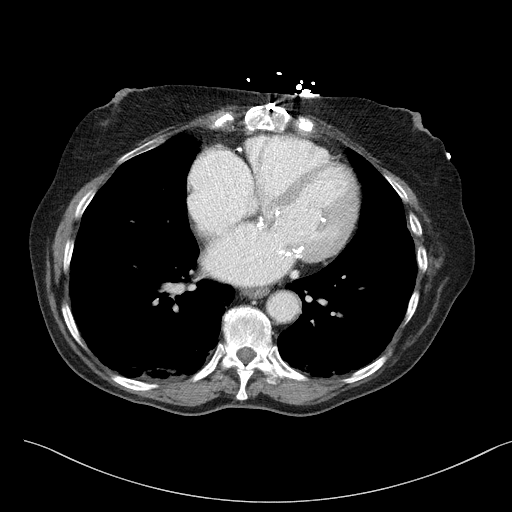

[Series 5: coronal st · coronal · 0.76mm/px · 3 of 98 slices shown]
[im 33/98  soft-tissue]
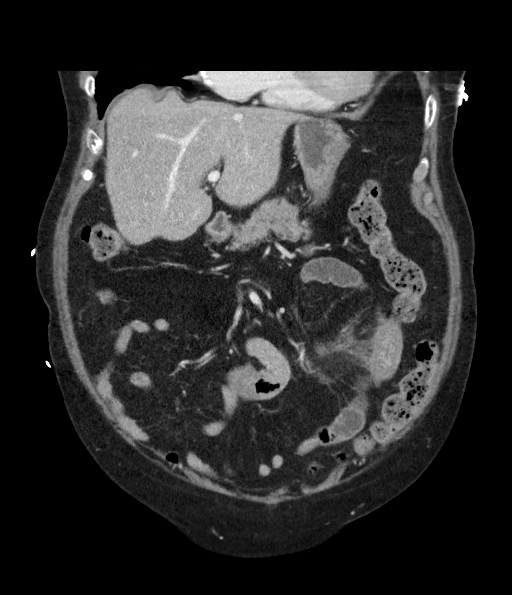
[im 44/98  soft-tissue]
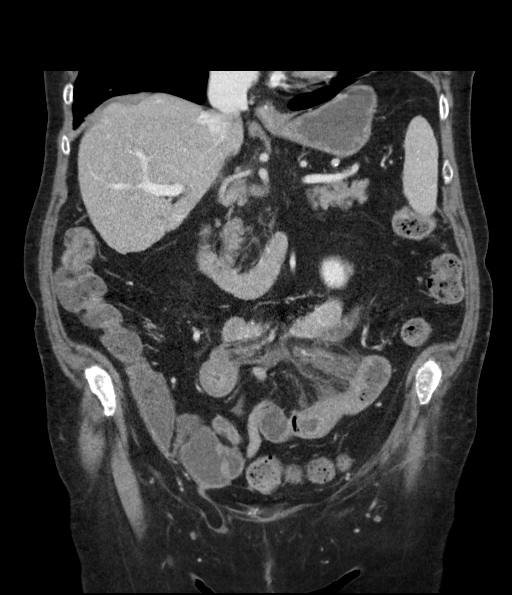
[im 54/98  soft-tissue]
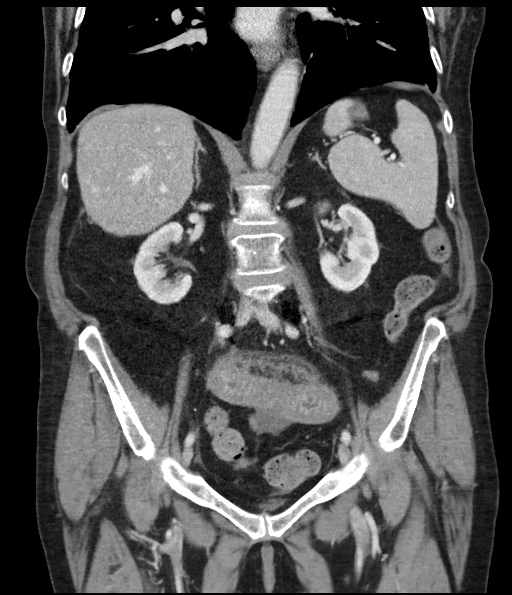

[15 of 46 positions shown; findings below may reference images not displayed]

FINDINGS: Lower chest: Scattered subpleural reticular and ground-glass
opacities seen within the bilateral lung bases, suspected reflect
fibrosis and/or scarring. Visualized lungs are otherwise clear.
Prominent coronary artery calcifications. Prosthetic mitral valve
noted. No pleural pericardial effusion.

Hepatobiliary: Liver demonstrates a normal contrast enhanced
appearance. Focal fat deposition noted a sent to the falciform
ligament. Gallbladder surgically absent. No biliary dilatation.

Pancreas: Pancreas within normal limits.

Spleen: Spleen within normal limits.

Adrenals/Urinary Tract: Adrenal glands are normal. Kidneys equal in
size with symmetric enhancement. Few scattered subcentimeter
hypodensities noted, too small the characterize, but statistically
likely reflects small cyst. No focal enhancing renal mass. Punctate
nonobstructive left renal nephrolithiasis. No hydronephrosis. No
hydroureter or visible ureterolithiasis. Bladder largely
decompressed without acute abnormality.

Stomach/Bowel: Stomach largely decompressed without acute
abnormality. Few scattered loops of prominent jejunum seen clustered
within the mid abdomen demonstrate prominent wall thickening and
mucosal hyperemia, suggesting acute enteritis. Adjacent hazy
inflammatory fat stranding and small volume free fluid. No evidence
for associated obstruction, perforation, or other complication. No
pneumatosis or portal venous gas. Small bowel decompressed distally.
Appendix not definitely visualized.: Of normal caliber without acute
inflammatory changes. Moderate retained stool within the distal
colon, which could reflect constipation.

Vascular/Lymphatic: Moderate aorto bi-iliac atherosclerotic disease.
No aneurysm. Mesenteric vessels patent proximally. No adenopathy.

Reproductive: Uterus is absent. Ovaries not confidently identified.
No adnexal mass.

Other: Small volume free fluid adjacent to the spleen and along the
left pericolic gutter as well as within the pelvis, likely reactive.
No free intraperitoneal air. Small fat containing paraumbilical
hernia noted without associated inflammation.

Musculoskeletal: External soft tissues demonstrate no acute finding.
No acute osseous abnormality. No discrete lytic or blastic osseous
lesions. Prior PLIF at L5-S1 noted.
IMPRESSION: 1. Prominent wall thickening with mucosal enhancement involving a
few scattered loops of jejunum within the mid abdomen, suggesting
acute enteritis. No evidence for associated obstruction,
perforation, or other complication.
2. Small volume free fluid within the abdomen and pelvis, likely
reactive.
3. Moderate retained stool within the distal colon, which could
reflect constipation.
4. Punctate nonobstructive left renal nephrolithiasis.
5. Moderate aorto bi-iliac atherosclerotic disease.

## 2020-03-21 ENCOUNTER — Other Ambulatory Visit: Payer: Self-pay

## 2020-03-24 ENCOUNTER — Ambulatory Visit (INDEPENDENT_AMBULATORY_CARE_PROVIDER_SITE_OTHER): Payer: Medicare Other | Admitting: General Practice

## 2020-03-24 ENCOUNTER — Telehealth: Payer: Self-pay | Admitting: Family Medicine

## 2020-03-24 ENCOUNTER — Other Ambulatory Visit: Payer: Self-pay

## 2020-03-24 DIAGNOSIS — I4891 Unspecified atrial fibrillation: Secondary | ICD-10-CM

## 2020-03-24 LAB — POCT INR: INR: 2.7 (ref 2.0–3.0)

## 2020-03-24 NOTE — Progress Notes (Signed)
  Chronic Care Management   Outreach Note  03/24/2020 Name: Catherine [name] MRN: [id number] DOB: 1935-04-09  Referred by: [name], MD Reason for referral : Chronic Care Management (Initial CCM Outreach)   An unsuccessful telephone outreach was attempted today. The patient was referred to the pharmacist for assistance with care management and care coordination.   Follow Up Plan:   [place]

## 2020-03-24 NOTE — Patient Instructions (Addendum)
Pre visit review using our clinic review tool, if applicable. No additional management support is needed unless otherwise documented below in the visit note.  Continue to take 1 tablet daily.  Re-check in 6 weeks.

## 2020-03-26 ENCOUNTER — Ambulatory Visit: Payer: Medicare Other

## 2020-04-04 ENCOUNTER — Telehealth: Payer: Self-pay | Admitting: Family Medicine

## 2020-04-04 NOTE — Progress Notes (Signed)
  Chronic Care Management   Note  04/04/2020 Name: SHEMIKA ROBBS MRN: [id number] DOB: [birth year]  [name] is a 85 y.o. year old female who is a primary care patient of Burchette, Alinda Sierras, MD. I reached out to [name] by phone today in response to a referral sent by Ms. Adelise M Kaleta's PCP, Burchette, Alinda Sierras, MD.   Ms. Steines was given information about Chronic Care Management services today including:  1. CCM service includes personalized support from designated clinical staff supervised by her physician, including individualized plan of care and coordination with other care providers 2. 24/7 contact phone numbers for assistance for urgent and routine care needs. 3. Service will only be billed when office clinical staff spend 20 minutes or more in a month to coordinate care. 4. Only one practitioner may furnish and bill the service in a calendar month. 5. The patient may stop CCM services at any time (effective at the end of the month) by phone call to the office staff.   Patient agreed to services and verbal consent obtained.   Follow up plan:   [place]

## 2020-04-11 ENCOUNTER — Telehealth: Payer: Self-pay | Admitting: *Deleted

## 2020-04-11 NOTE — Telephone Encounter (Signed)
Please advise 

## 2020-04-11 NOTE — Telephone Encounter (Signed)
At this point would just observe.  If she was vaccinated hopefully her risks should be fairly low.

## 2020-04-11 NOTE — Telephone Encounter (Signed)
Patient spoke with nurse triage. Patient reports states she has been exposed to [place] and she needs to know what to do. She was exposed to Covid  last Sunday and she is concerned. She is not having any symptoms.

## 2020-04-11 NOTE — Telephone Encounter (Signed)
Pt given information

## 2020-04-12 ENCOUNTER — Other Ambulatory Visit: Payer: Self-pay | Admitting: Family Medicine

## 2020-04-29 ENCOUNTER — Telehealth: Payer: Self-pay | Admitting: Family Medicine

## 2020-04-29 NOTE — Progress Notes (Signed)
  Chronic Care Management   Note  04/29/2020 Name: PRENTISS HAMMETT MRN: [id number] DOB: 10/16/1934  [name] Babler is a 85 y.o. year old female who is a primary care patient of Burchette, Alinda Sierras, MD. I reached out to [name] by phone today in response to a referral sent by Ms. Jaydin M Neels's PCP, Burchette, Alinda Sierras, MD.   Ms. Rayon was given information about Chronic Care Management services today including:  1. CCM service includes personalized support from designated clinical staff supervised by her physician, including individualized plan of care and coordination with other care providers 2. 24/7 contact phone numbers for assistance for urgent and routine care needs. 3. Service will only be billed when office clinical staff spend 20 minutes or more in a month to coordinate care. 4. Only one practitioner may furnish and bill the service in a calendar month. 5. The patient may stop CCM services at any time (effective at the end of the month) by phone call to the office staff.   Patient agreed to services and verbal consent obtained.   Follow up plan:   Car[facility] Perdue [employer]

## 2020-05-05 ENCOUNTER — Other Ambulatory Visit: Payer: Self-pay

## 2020-05-05 ENCOUNTER — Ambulatory Visit (INDEPENDENT_AMBULATORY_CARE_PROVIDER_SITE_OTHER): Payer: Medicare Other | Admitting: General Practice

## 2020-05-05 DIAGNOSIS — Z7901 Long term (current) use of anticoagulants: Secondary | ICD-10-CM | POA: Diagnosis not present

## 2020-05-05 LAB — POCT INR: INR: 2.5 (ref 2.0–3.0)

## 2020-05-05 NOTE — Patient Instructions (Addendum)
Pre visit review using our clinic review tool, if applicable. No additional management support is needed unless otherwise documented below in the visit note.  Continue to take 1 tablet daily.  Re-check in 6 weeks.

## 2020-05-21 ENCOUNTER — Other Ambulatory Visit: Payer: Self-pay | Admitting: Family Medicine

## 2020-05-21 ENCOUNTER — Other Ambulatory Visit: Payer: Self-pay | Admitting: Cardiovascular Disease

## 2020-05-30 ENCOUNTER — Other Ambulatory Visit: Payer: Self-pay | Admitting: Cardiovascular Disease

## 2020-05-30 ENCOUNTER — Other Ambulatory Visit: Payer: Self-pay | Admitting: Family Medicine

## 2020-05-30 DIAGNOSIS — I4891 Unspecified atrial fibrillation: Secondary | ICD-10-CM

## 2020-05-30 DIAGNOSIS — I4821 Permanent atrial fibrillation: Secondary | ICD-10-CM

## 2020-06-03 DIAGNOSIS — L03114 Cellulitis of left upper limb: Secondary | ICD-10-CM | POA: Diagnosis not present

## 2020-06-04 ENCOUNTER — Telehealth (INDEPENDENT_AMBULATORY_CARE_PROVIDER_SITE_OTHER): Payer: Medicare Other | Admitting: Family Medicine

## 2020-06-04 ENCOUNTER — Telehealth: Payer: Self-pay | Admitting: Family Medicine

## 2020-06-04 ENCOUNTER — Other Ambulatory Visit: Payer: Self-pay

## 2020-06-04 ENCOUNTER — Other Ambulatory Visit: Payer: Self-pay | Admitting: General Practice

## 2020-06-04 DIAGNOSIS — I4891 Unspecified atrial fibrillation: Secondary | ICD-10-CM

## 2020-06-04 DIAGNOSIS — I4821 Permanent atrial fibrillation: Secondary | ICD-10-CM

## 2020-06-04 DIAGNOSIS — S60222A Contusion of left hand, initial encounter: Secondary | ICD-10-CM | POA: Diagnosis not present

## 2020-06-04 MED ORDER — WARFARIN SODIUM 2 MG PO TABS: ORAL_TABLET | ORAL | 1 refills | Status: DC

## 2020-06-04 NOTE — Progress Notes (Signed)
Patient ID: Catherine [name], female   DOB: Mar 25, 1935, 85 y.o.   MRN: [id number]   This visit type was conducted due to national recommendations for restrictions regarding the COVID-19 pandemic in an effort to limit this patient's exposure and mitigate transmission in our community.   Virtual Visit via Telephone Note  I connected with Catherine [name] on 06/04/20 at  5:15 PM EDT by telephone and verified that I am speaking with the correct person using two identifiers.   I discussed the limitations, risks, security and privacy concerns of performing an evaluation and management service by telephone and the availability of in person appointments. I also discussed with the patient that there may be a patient responsible charge related to this service. The patient expressed understanding and agreed to proceed.  Location patient: home Location provider: work or home office Participants present for the call: patient, provider Patient did not have a visit in the prior 7 days to address this/these issue(s).   History of Present Illness: Patient called with recent left hand injury.  She is not sure exactly when this happened but possibly last weekend or late last week.  She thinks she hit her left hand against a door.  There was a skin tear and apparently some local swelling.  She applied some topical antibiotic over the weekend.  When she woke up Monday she states that her hand looked "puffy ".  She noticed some erythema.  She applied some mild pressure and she states some blood "squirted out ".  At that point she sought further care through urgent care.  There was concern for local infection and she was started on clindamycin 300 mg 3 times daily.  She is tolerating antibiotic thus far with no side effects.  She states her hand looks a little bit less swollen today.  No fevers or chills.  Last tetanus 2013  She is on Coumadin.  [name] to get INR this coming Monday.  She denies any bony tenderness  in the hand.  No other injuries reported.  Past Medical History:  Diagnosis Date  . Aortic stenosis, mild 07/26/2017  . Arthritis   . Asthma    last attack 02/2015  . Atrial fibrillation, chronic ([place])   . Cellulitis of left lower extremity 08/17/2016   Ulcer associated with severe venous insufficiency  . Chronic anticoagulation   . Chronic diastolic CHF (congestive heart failure) ([place])   . Chronic kidney disease    "RIGHT MANY KIDNEY INFECTIONS AND STONES"  . COPD (chronic obstructive pulmonary disease) ([place])   . Coronary artery disease   . Dizziness   . H/O: rheumatic fever   . Heart murmur   . Hypertension   . PONV (postoperative nausea and vomiting)    ' SOMETIMES', BUT NOT ALWAYS"  . S/P Maze operation for atrial fibrillation 10/14/2016   Complete bilateral atrial lesion set using cryothermy and bipolar radiofrequency ablation - atrial appendage was not treated due to previous surgical procedure (open mitral commissurotomy)  . S/P mitral valve replacement with bioprosthetic valve 10/14/2016   29 mm Medtronic Mosaic porcine bioprosthetic tissue valve  . UTI (urinary tract infection)   . Valvular heart disease    Has mitral stenosis with prior mitral commissurotomy in 1970   Past Surgical History:  Procedure Laterality Date  . ABDOMINAL HYSTERECTOMY  1983   endometriosis  . APPENDECTOMY    . BACK SURGERY     neurosurgery x2  . CARDIAC CATHETERIZATION    .  CARDIAC CATHETERIZATION N/A 08/03/2016   Procedure: Right/Left Heart Cath and Coronary Angiography;  Surgeon: Peter M Martinique, MD;  Location: [facility];  Service: Cardiovascular;  Laterality: N/A;  . cataract surg    . CHOLECYSTECTOMY N/A 05/30/2015   Procedure: LAPAROSCOPIC CHOLECYSTECTOMY WITH INTRAOPERATIVE CHOLANGIOGRAM;  Surgeon: Excell Seltzer, MD;  Location: [facility];  Service: General;  Laterality: N/A;  . COLONOSCOPY    . CORONARY ARTERY BYPASS GRAFT N/A 10/14/2016   Procedure: CORONARY ARTERY BYPASS  GRAFTING (CABG);  Surgeon: Rexene Alberts, MD;  Location: [facility];  Service: Open Heart Surgery;  Laterality: N/A;  . EYE SURGERY    . MAZE N/A 10/14/2016   Procedure: MAZE;  Surgeon: Rexene Alberts, MD;  Location: [facility];  Service: Open Heart Surgery;  Laterality: N/A;  . MITRAL VALVE REPLACEMENT N/A 10/14/2016   Procedure: REDO MITRAL VALVE REPLACEMENT (MVR);  Surgeon: Rexene Alberts, MD;  Location: [facility];  Service: Open Heart Surgery;  Laterality: N/A;  . MITRAL VALVE SURGERY Left 1970   Open mitral commissurotomy via left thoracotomy approach  . TEE WITHOUT CARDIOVERSION N/A 07/05/2016   Procedure: TRANSESOPHAGEAL ECHOCARDIOGRAM (TEE);  Surgeon: [name], MD;  Location: [place] [facility];  Service: Cardiovascular;  Laterality: N/A;  . TEE WITHOUT CARDIOVERSION N/A 10/14/2016   Procedure: TRANSESOPHAGEAL ECHOCARDIOGRAM (TEE);  Surgeon: Rexene Alberts, MD;  Location: [facility];  Service: Open Heart Surgery;  Laterality: N/A;    reports that she quit smoking about 44 years ago. Her smoking use included cigarettes. She has a 15.00 pack-year smoking history. She has never used smokeless tobacco. She reports that she does not drink alcohol and does not use drugs. family history includes Leukemia in her father. Allergies  Allergen Reactions  . Aldactone [Spironolactone] Other (See Comments)    dyspnea  . Amoxicillin Palpitations    Tachycardia Has patient had a PCN reaction causing immediate rash, facial/tongue/throat swelling, SOB or lightheadedness with hypotension: no Has patient had a PCN reaction causing severe rash involving mucus membranes or skin necrosis: {no Has patient had a PCN reaction that required hospitalization {no Has patient had a PCN reaction occurring within the last 10 years: {yes If all of the above answers are "NO", then may proceed with Cephalosporin use.  . Diltiazem Other (See Comments)    Causing headaches   . Flagyl [Metronidazole Hcl] Other (See Comments)     Causing headaches    . Flovent [Fluticasone Propionate] Other (See Comments)    Leg cramps  . Gabapentin Swelling  . Lyrica [Pregabalin] Swelling  . Quinidine Diarrhea and Other (See Comments)    Fever diarrhea  . Simvastatin Other (See Comments)    Leg pain, myalgia  . Tramadol Nausea Only  . Verapamil Other (See Comments)    myalgias  . Amlodipine     Low extremity edema  . Zetia [Ezetimibe]     LEG CRAMPS   . Ace Inhibitors Other (See Comments)    unknown  . Benazepril Hcl Cough  . Ciprocin-Fluocin-Procin [Fluocinolone Acetonide] Other (See Comments)    unknown  . Ciprofloxacin Diarrhea  . Codeine Nausea Only  . Nitrofurantoin Monohyd Macro Nausea Only      Observations/Objective: Patient sounds cheerful and well on the phone. I do not appreciate any SOB. Speech and thought processing are grossly intact. Patient reported vitals:  Assessment and Plan:  Left hand injury.  It sounds like from description she had hematoma and concern for some secondary infection.  Started by  urgent care on clindamycin.  -Recommend good local wound care with gentle washing with soap and water -Recommend frequent elevation -Follow-up promptly if she has any antibiotic side effects such as severe diarrhea- or for any fever or progressive erythema or swelling.  Follow Up Instructions:  - as above.  To get INR Monday and suggested we look at hand then if any concerns.   [id number] [id number] [id number] 21-30 I did not refer this patient for an OV in the next 24 hours for this/these issue(s).  I discussed the assessment and treatment plan with the patient. The patient was provided an opportunity to ask questions and all were answered. The patient agreed with the plan and demonstrated an understanding of the instructions.   The patient was advised to call back or seek an in-person evaluation if the symptoms worsen or if the condition fails to improve as anticipated.  I provided 16  minutes of non-face-to-face time during this encounter.   [name], MD

## 2020-06-04 NOTE — Telephone Encounter (Signed)
Please advise 

## 2020-06-04 NOTE — Telephone Encounter (Signed)
The patient was also wanting to know if she needs to get her coumadin checked before Monday.    Please advise

## 2020-06-04 NOTE — Telephone Encounter (Signed)
Patient hit her hand on the door knob and broke the skin and now she has a tear in the skin that is infected.   She went to the Urgent Care in [facility] and they prescribed her antibiotics.  She scheduled a virtual appointment for this after noon to talk with Dr. Elease Hashimoto.

## 2020-06-05 ENCOUNTER — Ambulatory Visit: Payer: Medicare Other

## 2020-06-05 ENCOUNTER — Other Ambulatory Visit: Payer: Self-pay | Admitting: General Practice

## 2020-06-05 DIAGNOSIS — N390 Urinary tract infection, site not specified: Secondary | ICD-10-CM

## 2020-06-05 MED ORDER — CEPHALEXIN 250 MG PO CAPS: ORAL_CAPSULE | ORAL | 0 refills | Status: DC

## 2020-06-05 MED ORDER — CEPHALEXIN 250 MG PO CAPS: ORAL_CAPSULE | ORAL | 1 refills | Status: DC

## 2020-06-07 NOTE — Telephone Encounter (Signed)
May refill Keflex with 3 additional refills.  Catherine [name] should be handing the coumadin refills.

## 2020-06-09 ENCOUNTER — Other Ambulatory Visit: Payer: Self-pay

## 2020-06-09 ENCOUNTER — Ambulatory Visit (INDEPENDENT_AMBULATORY_CARE_PROVIDER_SITE_OTHER): Payer: Medicare Other | Admitting: General Practice

## 2020-06-09 DIAGNOSIS — I4891 Unspecified atrial fibrillation: Secondary | ICD-10-CM

## 2020-06-09 LAB — POCT INR: INR: 3.2 — AB (ref 2.0–3.0)

## 2020-06-09 NOTE — Patient Instructions (Addendum)
Pre visit review using our clinic review tool, if applicable. No additional management support is needed unless otherwise documented below in the visit note.  Hold warfarin today and then continue to take 1 tablet daily.  Re-check in 4 weeks.

## 2020-06-16 ENCOUNTER — Telehealth: Payer: Self-pay

## 2020-06-16 NOTE — Progress Notes (Signed)
Chronic Care Management Pharmacy Assistant   Name: Catherine [name]  MRN: [id number] DOB: 1935-05-25  Reason for Encounter: Medication Review/ Initial question for initial visit with clinical pharmacist.    PCP : [name], MD  Allergies:   Allergies  Allergen Reactions  . Aldactone [Spironolactone] Other (See Comments)    dyspnea  . Amoxicillin Palpitations    Tachycardia Has patient had a PCN reaction causing immediate rash, facial/tongue/throat swelling, SOB or lightheadedness with hypotension: no Has patient had a PCN reaction causing severe rash involving mucus membranes or skin necrosis: {no Has patient had a PCN reaction that required hospitalization {no Has patient had a PCN reaction occurring within the last 10 years: {yes If all of the above answers are "NO", then may proceed with Cephalosporin use.  . Diltiazem Other (See Comments)    Causing headaches   . Flagyl [Metronidazole Hcl] Other (See Comments)    Causing headaches    . Flovent [Fluticasone Propionate] Other (See Comments)    Leg cramps  . Gabapentin Swelling  . Lyrica [Pregabalin] Swelling  . Quinidine Diarrhea and Other (See Comments)    Fever diarrhea  . Simvastatin Other (See Comments)    Leg pain, myalgia  . Tramadol Nausea Only  . Verapamil Other (See Comments)    myalgias  . Amlodipine     Low extremity edema  . Zetia [Ezetimibe]     LEG CRAMPS   . Ace Inhibitors Other (See Comments)    unknown  . Benazepril Hcl Cough  . Ciprocin-Fluocin-Procin [Fluocinolone Acetonide] Other (See Comments)    unknown  . Ciprofloxacin Diarrhea  . Codeine Nausea Only  . Nitrofurantoin Monohyd Macro Nausea Only    Medications: Outpatient Encounter Medications as of 06/16/2020  Medication Sig  . acetaminophen (TYLENOL) 500 MG tablet Take 500 mg by mouth daily.   Marland Kitchen albuterol (PROVENTIL HFA;VENTOLIN HFA) 108 (90 Base) MCG/ACT inhaler Inhale 2 puffs into the lungs every 6 (six) hours as needed  for wheezing or shortness of breath.  Marland Kitchen aspirin EC 81 MG EC tablet Take 1 tablet (81 mg total) by mouth daily.  . budesonide-formoterol (SYMBICORT) 80-4.5 MCG/ACT inhaler Inhale 2 puffs into the lungs 2 (two) times daily.  . cephALEXin (KEFLEX) 250 MG capsule TAKE (1) CAPSULE DAILY  . cephALEXin (KEFLEX) 250 MG capsule TAKE (1) CAPSULE DAILY  . diazepam (VALIUM) 5 MG tablet TAKE ONE TABLET AT BEDTIME  . doxazosin (CARDURA) 4 MG tablet TAKE 1 TABLET DAILY. STOP LOSARTAN  . fluconazole (DIFLUCAN) 150 MG tablet TAKE 1 TABLET BY MOUTH ONCE FOR 1 DOSE  . furosemide (LASIX) 40 MG tablet TAKE 1 TABLET DAILY. MAY TAKE 1 EXTRA AS NEEDED FOR SWELLING  . metoprolol succinate (TOPROL-XL) 25 MG 24 hr tablet TAKE 1 TABLET DAILY  . Multiple Vitamins-Minerals (HAIR/SKIN/NAILS/BIOTIN PO) Take 1 tablet by mouth daily.  . ondansetron (ZOFRAN ODT) 4 MG disintegrating tablet Take 1 tablet (4 mg total) by mouth every 8 (eight) hours as needed for nausea or vomiting.  Marland Kitchen OVER THE COUNTER MEDICATION Apply 1 application topically at bedtime as needed (Leg Pain). Magni-Life topical cream for leg pain.  . potassium chloride (KLOR-CON) 10 MEQ tablet TAKE 6 TABLETS DAILY  . rosuvastatin (CRESTOR) 5 MG tablet TAKE 1/2 TO 1 TABLET ON MONDAY AND FRIDAY AS TOLERATED  . warfarin (COUMADIN) 2 MG tablet Take 1 tablet daily or take as directed by anticoagulation clinic   No facility-administered encounter medications on file as of 06/16/2020.  Current Diagnosis: Patient Active Problem List   Diagnosis Date Noted  . Protein-calorie malnutrition, severe 09/05/2019  . Dehydration   . Gastroenteritis   . Enteritis 09/03/2019  . COVID-19 08/27/2019  . Dysuria 07/07/2019  . Chronic anxiety 12/23/2017  . Aortic stenosis, mild 07/26/2017  . Dyslipidemia 12/20/2016  . S/P mitral valve replacement with bioprosthetic valve + CABG x1 + maze procedure 10/14/2016  . S/P Maze operation for atrial fibrillation 10/14/2016  . S/P CABG x  1 10/14/2016  . Cellulitis of left lower extremity 08/17/2016  . Chronic diastolic CHF (congestive heart failure) ([place])   . Coronary artery disease   . Chronic insomnia 07/12/2016  . Dyspnea 03/26/2015  . Atrial fibrillation, chronic ([place]) 03/16/2015  . Warfarin-induced coagulopathy ([place]) 03/16/2015  . Valvular heart disease 03/16/2015  . COPD GOLD II  03/16/2015  . Nephrolithiasis 03/16/2015  . Essential hypertension   . RHEUMATIC MITRAL STENOSIS 01/05/2008  . Rheumatic heart disease 01/05/2008  . Atrial fibrillation ([place]) 01/05/2008     Follow-Up:  Pharmacist Review   Have you seen any other providers since your last visit? no Any changes in your medications or health? no Any side effects from any medications? no Do you have an symptoms or problems not managed by your medications? no Any concerns about your health right now? Yes   Patient states since she had covid she has not been feeling like her self.She been feeling tried and fatigue. Patient states when she was in the hospital they gave her extra medication to take, and she would like to stop taking some of the medication. Has your provider asked that you check blood pressure, blood sugar, or follow special diet at home? Yes  Patient states she checks her blood pressure once in a while since she had covid.  Patient states she cook at home using no salt. Do you get any type of exercise on a regular basis? No  Patient states she does not exercise due to falling on a treadmill in the past and hurting her leg. Can you think of a goal you would like to reach for your health? None ID Do you have any problems getting your medications? Yes  Patient states the prices of her medication can be expensive at times.   Is there anything that you would like to discuss during the appointment?   Patient states she would like to discuss patient assistance for the expensives for some of her medication.  Patient states she would like to discuss  stop taking some of her current medication.  Please bring medications and supplements to appointment  [place] Pharmacist Assistant [phone]

## 2020-06-17 ENCOUNTER — Telehealth: Payer: Self-pay

## 2020-06-17 ENCOUNTER — Ambulatory Visit: Payer: Medicare Other

## 2020-06-17 ENCOUNTER — Other Ambulatory Visit: Payer: Self-pay

## 2020-06-17 DIAGNOSIS — K529 Noninfective gastroenteritis and colitis, unspecified: Secondary | ICD-10-CM

## 2020-06-17 DIAGNOSIS — I10 Essential (primary) hypertension: Secondary | ICD-10-CM

## 2020-06-17 DIAGNOSIS — I5032 Chronic diastolic (congestive) heart failure: Secondary | ICD-10-CM

## 2020-06-17 DIAGNOSIS — E785 Hyperlipidemia, unspecified: Secondary | ICD-10-CM

## 2020-06-17 DIAGNOSIS — I099 Rheumatic heart disease, unspecified: Secondary | ICD-10-CM

## 2020-06-17 NOTE — Patient Instructions (Addendum)
Visit Information It was great speaking with you today!  Please let me know if you have any questions about our visit.  Plan:  . Please reach out to the [facility]) at [phone] to see if you qualify for the Extra Help Program to help you with your prescription medications. Please let me know if you do not qualify.   Doristine Section Clinical Pharmacist [facility] [place]    [place] Eating Plan [name] stands for "Dietary Approaches to Stop Hypertension." The DASH eating plan is a healthy eating plan that has been shown to reduce high blood pressure (hypertension). It may also reduce your risk for type 2 diabetes, heart disease, and stroke. The DASH eating plan may also help with weight loss. What are tips for following this plan?  General guidelines  Avoid eating more than 2,300 mg (milligrams) of salt (sodium) a day. If you have hypertension, you may need to reduce your sodium intake to 1,500 mg a day.  Limit alcohol intake to no more than 1 drink a day for nonpregnant women and 2 drinks a day for men. One drink equals 12 oz of beer, 5 oz of wine, or 1 oz of hard liquor.  Work with your health care provider to maintain a healthy body weight or to lose weight. Ask what an ideal weight is for you.  Get at least 30 minutes of exercise that causes your heart to beat faster (aerobic exercise) most days of the week. Activities may include walking, swimming, or biking.  Work with your health care provider or diet and nutrition specialist (dietitian) to adjust your eating plan to your individual calorie needs. Reading food labels   Check food labels for the amount of sodium per serving. Choose foods with less than 5 percent of the Daily Value of sodium. Generally, foods with less than 300 mg of sodium per serving fit into this eating plan.  To find whole grains, look for the word "whole" as the first word in the  ingredient list. Shopping  Buy products labeled as "low-sodium" or "no salt added."  Buy fresh foods. Avoid canned foods and premade or frozen meals. Cooking  Avoid adding salt when cooking. Use salt-free seasonings or herbs instead of table salt or sea salt. Check with your health care provider or pharmacist before using salt substitutes.  Do not fry foods. Cook foods using healthy methods such as baking, boiling, grilling, and broiling instead.  Cook with heart-healthy oils, such as olive, canola, soybean, or sunflower oil. Meal planning  Eat a balanced diet that includes: ? 5 or more servings of fruits and vegetables each day. At each meal, try to fill half of your plate with fruits and vegetables. ? Up to 6-8 servings of whole grains each day. ? Less than 6 oz of lean meat, poultry, or fish each day. A 3-oz serving of meat is about the same size as a deck of cards. One egg equals 1 oz. ? 2 servings of low-fat dairy each day. ? A serving of nuts, seeds, or beans 5 times each week. ? Heart-healthy fats. Healthy fats called Omega-3 fatty acids are found in foods such as flaxseeds and coldwater fish, like sardines, salmon, and mackerel.  Limit how much you eat of the following: ? Canned or prepackaged foods. ? Food that is high in trans fat, such as fried foods. ? Food that is high in saturated fat, such as fatty meat. ? Sweets, desserts,  sugary drinks, and other foods with added sugar. ? Full-fat dairy products.  Do not salt foods before eating.  Try to eat at least 2 vegetarian meals each week.  Eat more home-cooked food and less restaurant, buffet, and fast food.  When eating at a restaurant, ask that your food be prepared with less salt or no salt, if possible. What foods are recommended? The items listed may not be a complete list. Talk with your dietitian about what dietary choices are best for you. Grains Whole-grain or whole-wheat bread. Whole-grain or whole-wheat  pasta. Brown rice. [name]. Bulgur. Whole-grain and low-sodium cereals. Pita bread. Low-fat, low-sodium crackers. Whole-wheat flour tortillas. Vegetables Fresh or frozen vegetables (raw, steamed, roasted, or grilled). Low-sodium or reduced-sodium tomato and vegetable juice. Low-sodium or reduced-sodium tomato sauce and tomato paste. Low-sodium or reduced-sodium canned vegetables. Fruits All fresh, dried, or frozen fruit. Canned fruit in natural juice (without added sugar). Meat and other protein foods Skinless chicken or [place]. Ground chicken or [place]. Pork with fat trimmed off. Fish and seafood. Egg whites. Dried beans, peas, or lentils. Unsalted nuts, nut butters, and seeds. Unsalted canned beans. Lean cuts of beef with fat trimmed off. Low-sodium, lean deli meat. Dairy Low-fat (1%) or fat-free (skim) milk. Fat-free, low-fat, or reduced-fat cheeses. Nonfat, low-sodium ricotta or cottage cheese. Low-fat or nonfat yogurt. Low-fat, low-sodium cheese. Fats and oils Soft margarine without trans fats. Vegetable oil. Low-fat, reduced-fat, or light mayonnaise and salad dressings (reduced-sodium). Canola, safflower, olive, soybean, and sunflower oils. Avocado. Seasoning and other foods Herbs. Spices. Seasoning mixes without salt. Unsalted popcorn and pretzels. Fat-free sweets. What foods are not recommended? The items listed may not be a complete list. Talk with your dietitian about what dietary choices are best for you. Grains Baked goods made with fat, such as croissants, muffins, or some breads. Dry pasta or rice meal packs. Vegetables Creamed or fried vegetables. Vegetables in a cheese sauce. Regular canned vegetables (not low-sodium or reduced-sodium). Regular canned tomato sauce and paste (not low-sodium or reduced-sodium). Regular tomato and vegetable juice (not low-sodium or reduced-sodium). Catherine [name]. Olives. Fruits Canned fruit in a light or heavy syrup. Fried fruit. Fruit in cream or  butter sauce. Meat and other protein foods Fatty cuts of meat. Ribs. Fried meat. Catherine [name]. Sausage. Bologna and other processed lunch meats. Salami. Fatback. Hotdogs. Bratwurst. Salted nuts and seeds. Canned beans with added salt. Canned or smoked fish. Whole eggs or egg yolks. Chicken or [place] with skin. Dairy Whole or 2% milk, cream, and half-and-half. Whole or full-fat cream cheese. Whole-fat or sweetened yogurt. Full-fat cheese. Nondairy creamers. Whipped toppings. Processed cheese and cheese spreads. Fats and oils Butter. Stick margarine. Lard. Shortening. Ghee. Bacon fat. Tropical oils, such as coconut, palm kernel, or palm oil. Seasoning and other foods Salted popcorn and pretzels. Onion salt, garlic salt, seasoned salt, table salt, and sea salt. Worcestershire sauce. Tartar sauce. Barbecue sauce. Teriyaki sauce. Soy sauce, including reduced-sodium. Steak sauce. Canned and packaged gravies. Fish sauce. Oyster sauce. Cocktail sauce. Horseradish that you find on the shelf. Ketchup. Mustard. Meat flavorings and tenderizers. Bouillon cubes. Hot sauce and Tabasco sauce. Premade or packaged marinades. Premade or packaged taco seasonings. Relishes. Regular salad dressings. Where to find more information:  National Heart, Lung, and [place]: https://wilson-eaton.com/  American Heart Association: www.heart.org Summary  The DASH eating plan is a healthy eating plan that has been shown to reduce high blood pressure (hypertension). It may also reduce your risk for type 2 diabetes, heart disease,  and stroke.  With the DASH eating plan, you should limit salt (sodium) intake to 2,300 mg a day. If you have hypertension, you may need to reduce your sodium intake to 1,500 mg a day.  When on the DASH eating plan, aim to eat more fresh fruits and vegetables, whole grains, lean proteins, low-fat dairy, and heart-healthy fats.  Work with your health care provider or diet and nutrition specialist (dietitian) to  adjust your eating plan to your individual calorie needs. This information is not intended to replace advice given to you by your health care provider. Make sure you discuss any questions you have with your health care provider. Document Revised: 08/26/2017 Document Reviewed: 09/06/2016 Elsevier Patient Education  2020 [employer].

## 2020-06-17 NOTE — Chronic Care Management (AMB) (Signed)
Chronic Care Management Pharmacy  Name: Catherine [name]  MRN: [id number] DOB: 07-12-1935   Chief Complaint/ HPI  [name],  85 y.o. , female presents for their Initial CCM visit with the clinical pharmacist In office.  PCP : [name], MD Patient Care Team: [name], MD as PCP - General (Family Medicine) [name], MD as PCP - Cardiology (Cardiology) Germaine Pomfret, [facility] as Pharmacist (Pharmacist)  Their chronic conditions include: Hypertension, Hyperlipidemia, Atrial Fibrillation, Heart Failure, Coronary Artery Disease, COPD and Anxiety   Office Visits: 06/04/20: Video Visit with Dr. Elease Hashimoto for traumatic hematoma. Patient started on clindamycin 300 mg TID at urgent care.   Consult Visit: 01/16/20: Patient presented to Dr. Fontaine No (dermatology)  01/10/20: Patient presented to Dr. Oval Linsey (Cardiology) for follow-up.   Allergies  Allergen Reactions  . Aldactone [Spironolactone] Other (See Comments)    dyspnea  . Amoxicillin Palpitations    Tachycardia Has patient had a PCN reaction causing immediate rash, facial/tongue/throat swelling, SOB or lightheadedness with hypotension: no Has patient had a PCN reaction causing severe rash involving mucus membranes or skin necrosis: {no Has patient had a PCN reaction that required hospitalization {no Has patient had a PCN reaction occurring within the last 10 years: {yes If all of the above answers are "NO", then may proceed with Cephalosporin use.  . Diltiazem Other (See Comments)    Causing headaches   . Flagyl [Metronidazole Hcl] Other (See Comments)    Causing headaches    . Flovent [Fluticasone Propionate] Other (See Comments)    Leg cramps  . Gabapentin Swelling  . Lyrica [Pregabalin] Swelling  . Quinidine Diarrhea and Other (See Comments)    Fever diarrhea  . Simvastatin Other (See Comments)    Leg pain, myalgia  . Tramadol Nausea Only  . Verapamil Other (See Comments)     myalgias  . Amlodipine     Low extremity edema  . Zetia [Ezetimibe]     LEG CRAMPS   . Ace Inhibitors Other (See Comments)    unknown  . Benazepril Hcl Cough  . Ciprocin-Fluocin-Procin [Fluocinolone Acetonide] Other (See Comments)    unknown  . Ciprofloxacin Diarrhea  . Codeine Nausea Only  . Nitrofurantoin Monohyd Macro Nausea Only    Medications: Outpatient Encounter Medications as of 06/17/2020  Medication Sig  . acetaminophen (TYLENOL) 500 MG tablet Take 500 mg by mouth daily.   Marland Kitchen albuterol (PROVENTIL HFA;VENTOLIN HFA) 108 (90 Base) MCG/ACT inhaler Inhale 2 puffs into the lungs every 6 (six) hours as needed for wheezing or shortness of breath.  Marland Kitchen aspirin EC 81 MG EC tablet Take 1 tablet (81 mg total) by mouth daily.  . budesonide-formoterol (SYMBICORT) 80-4.5 MCG/ACT inhaler Inhale 2 puffs into the lungs 2 (two) times daily.  . cephALEXin (KEFLEX) 250 MG capsule TAKE (1) CAPSULE DAILY  . diazepam (VALIUM) 5 MG tablet TAKE ONE TABLET AT BEDTIME  . doxazosin (CARDURA) 4 MG tablet TAKE 1 TABLET DAILY. STOP LOSARTAN  . furosemide (LASIX) 40 MG tablet TAKE 1 TABLET DAILY. MAY TAKE 1 EXTRA AS NEEDED FOR SWELLING  . metoprolol succinate (TOPROL-XL) 25 MG 24 hr tablet TAKE 1 TABLET DAILY  . Multiple Vitamins-Minerals (HAIR/SKIN/NAILS/BIOTIN PO) Take 1 tablet by mouth daily.  . potassium chloride (KLOR-CON) 10 MEQ tablet TAKE 6 TABLETS DAILY  . rosuvastatin (CRESTOR) 5 MG tablet TAKE 1/2 TO 1 TABLET ON MONDAY AND FRIDAY AS TOLERATED  . warfarin (COUMADIN) 2 MG tablet Take 1 tablet daily or  take as directed by anticoagulation clinic  . cephALEXin (KEFLEX) 250 MG capsule TAKE (1) CAPSULE DAILY  . fluconazole (DIFLUCAN) 150 MG tablet TAKE 1 TABLET BY MOUTH ONCE FOR 1 DOSE  . ondansetron (ZOFRAN ODT) 4 MG disintegrating tablet Take 1 tablet (4 mg total) by mouth every 8 (eight) hours as needed for nausea or vomiting.  Marland Kitchen OVER THE COUNTER MEDICATION Apply 1 application topically at bedtime as  needed (Leg Pain). Magni-Life topical cream for leg pain.   No facility-administered encounter medications on file as of 06/17/2020.    Wt Readings from Last 3 Encounters:  01/10/20 146 lb 12.8 oz (66.6 kg)  01/08/20 145 lb 12.8 oz (66.1 kg)  11/02/19 150 lb 11.2 oz (68.4 kg)    Current Diagnosis/Assessment:  SDOH Interventions     Most Recent Value  SDOH Interventions  Financial Strain Interventions Other (Comment)  [Referred to [facility]]  Transportation Interventions Intervention Not Indicated      Goals Addressed            This Visit's Progress   . Chronic Care Management       CARE PLAN ENTRY (see longitudinal plan of care for additional care plan information)  Current Barriers:  . Chronic Disease Management support, education, and care coordination needs related to Hypertension, Hyperlipidemia, Atrial Fibrillation, Heart Failure, Coronary Artery Disease, COPD and Anxiety    Hypertension BP Readings from Last 3 Encounters:  01/10/20 (!) 142/80  01/08/20 140/66  11/02/19 130/80   . Pharmacist Clinical Goal(s): o Over the next 90 days, patient will work with PharmD and providers to maintain BP goal <130/80 . Current regimen:  . Doxazosin 4 mg daily  . Furosemide 40 mg daily   . Metoprolol XL 25 mg daily  . Interventions: o Discussed low salt diet and exercising as tolerated extensively o Will initiate blood pressure monitoring plan  . Patient self care activities - Over the next 90 days, patient will: o Check Blood pressure 1-2 times weekly, document, and provide at future appointments o Ensure daily salt intake < 2300 mg/day  Hyperlipidemia Lab Results  Component Value Date/Time   LDLCALC 135 (H) 06/06/2019 11:16 AM   LDLDIRECT 68.0 09/24/2019 10:54 AM   . Pharmacist Clinical Goal(s): o Over the next 90 days, patient will work with PharmD and providers to maintain LDL goal < 70 . Current regimen:  o Rosuvastatin 5 mg 1 tablet on Mondays and Fridays   . Interventions: o Discussed low cholesterol diet and exercising as tolerated extensively o Will initiate cholesterol monitoring plan   Medication management . Pharmacist Clinical Goal(s): o Over the next 90 days, patient will work with PharmD and providers to maintain optimal medication adherence . Current pharmacy: [facility] . Interventions o Comprehensive medication review performed. o Continue current medication management strategy . Patient self care activities - Over the next 90 days, patient will: o Take medications as prescribed o Report any questions or concerns to PharmD and/or provider(s)      Hypertension   BP goal is:  <140/90  Office blood pressures are  BP Readings from Last 3 Encounters:  01/10/20 (!) 142/80  01/08/20 140/66  11/02/19 130/80   Patient checks BP at home infrequently Patient home BP readings are ranging:  138/69 9/20 4 PM  129/60 9/20 8 PM  119/73 9/21 10 AM   Patient has failed these meds in the past: Amlodipine, atenolol, irbesartan, losartan, maxzide, nebivolol, spironolactone,  Patient is currently controlled on the following  medications:  . Doxazosin 4 mg daily  . Furosemide 40 mg daily + 40 mg PRN  . Metoprolol XL 25 mg daily   We discussed diet and exercise extensively  Plan  Continue current medications   Heart Failure   Type: Diastolic  Last ejection fraction: 60-65%  NYHA Class: II (slight limitation of activity) AHA HF Stage: B (Heart disease present - no symptoms present)  Patient has failed these meds in past: n/a Patient is currently controlled on the following medications:  . Doxazosin 4 mg daily  . Furosemide 40 mg daily + 40 mg PRN  . Metoprolol XL 25 mg daily    We discussed weighing daily; if you gain more than 3 pounds in one day or 5 pounds in one week call your doctor  Plan  Continue current medications  AFIB   Patient is currently rate controlled.  Patient has failed these meds in  past: Amiodarone Patient is currently controlled on the following medications:  Marland Kitchen Metoprolol XL 25 mg daily  . Warfarin 2 mg 1 tablet daily    We discussed:  Denies unusual bruising/bleeding  Plan  Continue current medications  COPD   Last spirometry score: FEV 67% (10/12/16)  Gold Grade: Gold 2 (FEV1 50-79%) Current COPD Classification:  A (low sx, <2 exacerbations/yr)  Eosinophil count:   Lab Results  Component Value Date/Time   EOSPCT 1.8 09/24/2019 10:54 AM  %                               Eos (Absolute):  Lab Results  Component Value Date/Time   EOSABS 0.1 09/24/2019 10:54 AM   EOSABS 0.1 06/06/2019 11:16 AM    Tobacco Status:  Social History   Tobacco Use  Smoking Status Former Smoker  . Packs/day: 1.00  . Years: 15.00  . Pack years: 15.00  . Types: Cigarettes  . Quit date: 09/28/1975  . Years since quitting: 44.7  Smokeless Tobacco Never Used    Patient has failed these meds in past: n/a Patient is currently controlled on the following medications:  . Albuterol HFA 108 mcg/act  . Symbicort 80-4.5 mcg/act 2 puff twice daily   Using maintenance inhaler regularly? Yes Frequency of rescue inhaler use:  3-5x times per week  We discussed:  proper inhaler technique  Plan  Continue current medications  Hyperlipidemia   LDL goal < 70  Lipid Panel     Component Value Date/Time   CHOL 163 09/24/2019 1054   CHOL 222 (H) 06/06/2019 1116   TRIG 214.0 (H) 09/24/2019 1054   HDL 52.70 09/24/2019 1054   HDL 53 06/06/2019 1116   LDLCALC 135 (H) 06/06/2019 1116   LDLDIRECT 68.0 09/24/2019 1054    Hepatic Function Latest Ref Rng & Units 09/24/2019 09/06/2019 09/05/2019  Total Protein 6.0 - 8.3 g/dL 6.0 5.8(L) 5.7(L)  Albumin 3.5 - 5.2 g/dL 3.9 2.9(L) 2.8(L)  AST 0 - 37 U/L 23 29 21   ALT 0 - 35 U/L 22 23 15   Alk Phosphatase 39 - 117 U/L 94 57 59  Total Bilirubin 0.2 - 1.2 mg/dL 0.9 0.7 0.6  Bilirubin, Direct 0.0 - 0.3 mg/dL 0.2 - -     The ASCVD Risk  score ([place]., et al., 2013) failed to calculate for the following reasons:   The 2013 ASCVD risk score is only valid for ages 58 to 75   Patient has failed these meds in  past: n/a Patient is currently controlled on the following medications:  . Rosuvastatin 5 mg 1 tablet on Mon and Fridays   We discussed:  diet and exercise extensively  Plan  Continue current medications  Depression / Anxiety   PHQ9 Score:  PHQ9 SCORE ONLY 04/18/2019 03/25/2017 12/23/2016  PHQ-9 Total Score 4 3 11    GAD7 Score: No flowsheet data found.  Patient has failed these meds in past: n/a Patient is currently controlled on the following medications:  . Diazepam 5 mg QHS   We discussed:  Discussed long-term risks of BZD use. Patient denies symptoms of oversedation.   Plan  Continue current medications  Misc / OTC   . APAP 500 mg daily  . Aspirin 81 mg daily  . Cephalexin 250 mg daily  . Fluconazole 150 mg  . Multivitamin daily (Vitafusion Multivite Gummies)  . Ondansetron ODT 4 mg q8hr PRN  . Magni-Life Topical Cream  . Potassium 10 mEq 6 tablets daily (3 tablets twice daily)    We discussed:  Patient interested to know if she still needs to be taking potassium. Patient reports concerns with potential yeast infection, wants to know if new dose of fluconazole can be sent in.   Plan  Recommend rechecking BMP to determine if potassium can be decreased.   Vaccines   Reviewed and discussed patient's vaccination history.    Immunization History  Administered Date(s) Administered  . Influenza Split 08/22/2011, 08/06/2012, 07/26/2013  . Influenza, High Dose Seasonal PF 09/17/2015, 07/12/2016, 08/01/2017  . Influenza,inj,Quad PF,6+ Mos 06/21/2019  . Influenza,inj,quad, With Preservative 08/28/2018, 06/21/2019  . Influenza-Unspecified 09/18/2018  . Pneumococcal Conjugate-13 09/17/2015  . Pneumococcal Polysaccharide-23 11/22/2011  . Tdap 05/04/2012  . Zoster 08/22/2011  . Zoster  Recombinat (Shingrix) 10/13/2017, 12/13/2017    Medication Management   Pt uses [facility] for all medications  We discussed: Discussed benefits of medication synchronization, packaging and delivery as well as enhanced pharmacist oversight with [employer].   Patient discussed concerns with affordability of medications.   Plan  Continue current medication management strategy  Will apply for Extra Help Program.  Follow up: 12 month phone visit  Doristine Section Clinical Pharmacist [facility] [place]

## 2020-06-17 NOTE — Telephone Encounter (Signed)
-----   Message from Germaine Pomfret, [facility] sent at 06/16/2020  9:05 AM EDT ----- Regarding: CCM Referral Tillie Rung,  Can you please place a referral to chronic care management for this patient?  Thanks, Doristine Section Clinical Pharmacist [facility] [place]

## 2020-06-19 ENCOUNTER — Telehealth: Payer: Self-pay

## 2020-06-19 ENCOUNTER — Other Ambulatory Visit: Payer: Self-pay | Admitting: Family Medicine

## 2020-06-19 MED ORDER — FLUCONAZOLE 150 MG PO TABS: 150.0000 mg | ORAL_TABLET | Freq: Once | ORAL | 0 refills | Status: DC

## 2020-06-19 NOTE — Progress Notes (Addendum)
Received call from patient, patient is requesting RX Diflucan to be sent to her pharmacy [facility] on [address]. Patient states she is unsure if needs to ask her PCP or Urgent Care. Please Advise.  [facility] Pharmacist Assistant [phone]

## 2020-06-19 NOTE — Telephone Encounter (Signed)
I sent in a Fluconazole 150 mg tablet times one dose.

## 2020-06-19 NOTE — Addendum Note (Signed)
Addended by: [name] on: 06/19/2020 01:43 PM   Modules accepted: Orders

## 2020-07-01 ENCOUNTER — Encounter: Payer: Self-pay | Admitting: Family Medicine

## 2020-07-01 ENCOUNTER — Ambulatory Visit (INDEPENDENT_AMBULATORY_CARE_PROVIDER_SITE_OTHER): Payer: Medicare Other | Admitting: Family Medicine

## 2020-07-01 ENCOUNTER — Other Ambulatory Visit: Payer: Self-pay

## 2020-07-01 VITALS — BP 128/72 | HR 60 | Temp 98.4°F | Ht 63.0 in | Wt 149.0 lb

## 2020-07-01 DIAGNOSIS — S61411A Laceration without foreign body of right hand, initial encounter: Secondary | ICD-10-CM | POA: Diagnosis not present

## 2020-07-01 DIAGNOSIS — Z79899 Other long term (current) drug therapy: Secondary | ICD-10-CM

## 2020-07-01 DIAGNOSIS — E785 Hyperlipidemia, unspecified: Secondary | ICD-10-CM | POA: Diagnosis not present

## 2020-07-01 DIAGNOSIS — Z23 Encounter for immunization: Secondary | ICD-10-CM | POA: Diagnosis not present

## 2020-07-01 DIAGNOSIS — R234 Changes in skin texture: Secondary | ICD-10-CM

## 2020-07-01 NOTE — Progress Notes (Signed)
Established Patient Office Visit  Subjective:  Patient ID: Catherine [name], female    DOB: [birth year]  Age: 85 y.o. MRN: [id number]  CC: No chief complaint on file.   HPI PRUDIE GUTHRIDGE presents for several items as below  1 week ago she was reaching into a drawer and a sharp wire in the drawer (that was part of a bracket) cut the dorsum of her right hand near the wrist.  She had a linear cut about 2 cm in length.  She is on Coumadin and had a fairly profuse amount of bleeding.  This was a superficial cut and nongaping.  Her last tetanus was 2013.  She has had some mild surrounding redness.  No drainage.  No fever.  No red streaks.  She had some fullness in both ears.  History of cerumen impactions in the past.  She has scabbed area on her right upper back area.  Denies any injury.  She has noticed this for a couple of weeks now.  She has had fatigue ever since she had Covid infection over a year ago.  She has chronic dyspnea which is unchanged.  Past Medical History:  Diagnosis Date  . Aortic stenosis, mild 07/26/2017  . Arthritis   . Asthma    last attack 02/2015  . Atrial fibrillation, chronic ([place])   . Cellulitis of left lower extremity 08/17/2016   Ulcer associated with severe venous insufficiency  . Chronic anticoagulation   . Chronic diastolic CHF (congestive heart failure) ([place])   . Chronic kidney disease    "RIGHT MANY KIDNEY INFECTIONS AND STONES"  . COPD (chronic obstructive pulmonary disease) ([place])   . Coronary artery disease   . Dizziness   . H/O: rheumatic fever   . Heart murmur   . Hypertension   . PONV (postoperative nausea and vomiting)    ' SOMETIMES', BUT NOT ALWAYS"  . S/P Maze operation for atrial fibrillation 10/14/2016   Complete bilateral atrial lesion set using cryothermy and bipolar radiofrequency ablation - atrial appendage was not treated due to previous surgical procedure (open mitral commissurotomy)  . S/P mitral valve replacement with  bioprosthetic valve 10/14/2016   29 mm Medtronic Mosaic porcine bioprosthetic tissue valve  . UTI (urinary tract infection)   . Valvular heart disease    Has mitral stenosis with prior mitral commissurotomy in 1970    Past Surgical History:  Procedure Laterality Date  . ABDOMINAL HYSTERECTOMY  1983   endometriosis  . APPENDECTOMY    . BACK SURGERY     neurosurgery x2  . CARDIAC CATHETERIZATION    . CARDIAC CATHETERIZATION N/A 08/03/2016   Procedure: Right/Left Heart Cath and Coronary Angiography;  Surgeon: Peter M Martinique, MD;  Location: [facility];  Service: Cardiovascular;  Laterality: N/A;  . cataract surg    . CHOLECYSTECTOMY N/A 05/30/2015   Procedure: LAPAROSCOPIC CHOLECYSTECTOMY WITH INTRAOPERATIVE CHOLANGIOGRAM;  Surgeon: Excell Seltzer, MD;  Location: [facility];  Service: General;  Laterality: N/A;  . COLONOSCOPY    . CORONARY ARTERY BYPASS GRAFT N/A 10/14/2016   Procedure: CORONARY ARTERY BYPASS GRAFTING (CABG);  Surgeon: Rexene Alberts, MD;  Location: [facility];  Service: Open Heart Surgery;  Laterality: N/A;  . EYE SURGERY    . MAZE N/A 10/14/2016   Procedure: MAZE;  Surgeon: Rexene Alberts, MD;  Location: [facility];  Service: Open Heart Surgery;  Laterality: N/A;  . MITRAL VALVE REPLACEMENT N/A 10/14/2016   Procedure: REDO MITRAL VALVE REPLACEMENT (MVR);  Surgeon: Rexene Alberts, MD;  Location: [facility];  Service: Open Heart Surgery;  Laterality: N/A;  . MITRAL VALVE SURGERY Left 1970   Open mitral commissurotomy via left thoracotomy approach  . TEE WITHOUT CARDIOVERSION N/A 07/05/2016   Procedure: TRANSESOPHAGEAL ECHOCARDIOGRAM (TEE);  Surgeon: [name], MD;  Location: [facility];  Service: Cardiovascular;  Laterality: N/A;  . TEE WITHOUT CARDIOVERSION N/A 10/14/2016   Procedure: TRANSESOPHAGEAL ECHOCARDIOGRAM (TEE);  Surgeon: Rexene Alberts, MD;  Location: [facility];  Service: Open Heart Surgery;  Laterality: N/A;    Family History  Problem Relation Age of Onset  .  Leukemia Father   . Breast cancer Neg Hx     Social History   Socioeconomic History  . Marital status: Married    Spouse name: Not on file  . Number of children: Not on file  . Years of education: Not on file  . Highest education level: Not on file  Occupational History  . Not on file  Tobacco Use  . Smoking status: Former Smoker    Packs/day: 1.00    Years: 15.00    Pack years: 15.00    Types: Cigarettes    Quit date: 09/28/1975    Years since quitting: 44.7  . Smokeless tobacco: Never Used  Vaping Use  . Vaping Use: Never used  Substance and Sexual Activity  . Alcohol use: No    Alcohol/week: 0.0 standard drinks  . Drug use: No  . Sexual activity: Yes  Other Topics Concern  . Not on file  Social History Narrative  . Not on file   Social Determinants of Health   Financial Resource Strain: High Risk  . Difficulty of Paying Living Expenses: Hard  Food Insecurity:   . Worried About Charity fundraiser in the Last Year: Not on file  . Ran Out of Food in the Last Year: Not on file  Transportation Needs: No Transportation Needs  . Lack of Transportation (Medical): No  . Lack of Transportation (Non-Medical): No  Physical Activity:   . Days of Exercise per Week: Not on file  . Minutes of Exercise per Session: Not on file  Stress:   . Feeling of Stress : Not on file  Social Connections:   . Frequency of Communication with Friends and Family: Not on file  . Frequency of Social Gatherings with Friends and Family: Not on file  . Attends Religious Services: Not on file  . Active Member of Clubs or Organizations: Not on file  . Attends Archivist Meetings: Not on file  . Marital Status: Not on file  Intimate Partner Violence:   . Fear of Current or Ex-Partner: Not on file  . Emotionally Abused: Not on file  . Physically Abused: Not on file  . Sexually Abused: Not on file    Outpatient Medications Prior to Visit  Medication Sig Dispense Refill  .  acetaminophen (TYLENOL) 500 MG tablet Take 500 mg by mouth daily.     Marland Kitchen albuterol (PROVENTIL HFA;VENTOLIN HFA) 108 (90 Base) MCG/ACT inhaler Inhale 2 puffs into the lungs every 6 (six) hours as needed for wheezing or shortness of breath. 1 Inhaler 0  . aspirin EC 81 MG EC tablet Take 1 tablet (81 mg total) by mouth daily.    . budesonide-formoterol (SYMBICORT) 80-4.5 MCG/ACT inhaler Inhale 2 puffs into the lungs 2 (two) times daily. 1 Inhaler 11  . cephALEXin (KEFLEX) 250 MG capsule TAKE (1) CAPSULE DAILY 30 capsule 3  .  cephALEXin (KEFLEX) 250 MG capsule TAKE (1) CAPSULE DAILY 30 capsule 1  . diazepam (VALIUM) 5 MG tablet TAKE ONE TABLET AT BEDTIME 30 tablet 5  . doxazosin (CARDURA) 4 MG tablet TAKE 1 TABLET DAILY. STOP LOSARTAN 90 tablet 3  . fluconazole (DIFLUCAN) 150 MG tablet TAKE 1 TABLET BY MOUTH ONCE FOR 1 DOSE 1 tablet 0  . furosemide (LASIX) 40 MG tablet TAKE 1 TABLET DAILY. MAY TAKE 1 EXTRA AS NEEDED FOR SWELLING 60 tablet 6  . metoprolol succinate (TOPROL-XL) 25 MG 24 hr tablet TAKE 1 TABLET DAILY 90 tablet 0  . Multiple Vitamins-Minerals (HAIR/SKIN/NAILS/BIOTIN PO) Take 1 tablet by mouth daily.    . ondansetron (ZOFRAN ODT) 4 MG disintegrating tablet Take 1 tablet (4 mg total) by mouth every 8 (eight) hours as needed for nausea or vomiting. 15 tablet 0  . OVER THE COUNTER MEDICATION Apply 1 application topically at bedtime as needed (Leg Pain). Magni-Life topical cream for leg pain.    . potassium chloride (KLOR-CON) 10 MEQ tablet TAKE 6 TABLETS DAILY 180 tablet 1  . rosuvastatin (CRESTOR) 5 MG tablet TAKE 1/2 TO 1 TABLET ON MONDAY AND FRIDAY AS TOLERATED 20 tablet 5  . warfarin (COUMADIN) 2 MG tablet Take 1 tablet daily or take as directed by anticoagulation clinic 100 tablet 1   No facility-administered medications prior to visit.    Allergies  Allergen Reactions  . Aldactone [Spironolactone] Other (See Comments)    dyspnea  . Amoxicillin Palpitations    Tachycardia Has  patient had a PCN reaction causing immediate rash, facial/tongue/throat swelling, SOB or lightheadedness with hypotension: no Has patient had a PCN reaction causing severe rash involving mucus membranes or skin necrosis: {no Has patient had a PCN reaction that required hospitalization {no Has patient had a PCN reaction occurring within the last 10 years: {yes If all of the above answers are "NO", then may proceed with Cephalosporin use.  . Diltiazem Other (See Comments)    Causing headaches   . Flagyl [Metronidazole Hcl] Other (See Comments)    Causing headaches    . Flovent [Fluticasone Propionate] Other (See Comments)    Leg cramps  . Gabapentin Swelling  . Lyrica [Pregabalin] Swelling  . Quinidine Diarrhea and Other (See Comments)    Fever diarrhea  . Simvastatin Other (See Comments)    Leg pain, myalgia  . Tramadol Nausea Only  . Verapamil Other (See Comments)    myalgias  . Amlodipine     Low extremity edema  . Zetia [Ezetimibe]     LEG CRAMPS   . Ace Inhibitors Other (See Comments)    unknown  . Benazepril Hcl Cough  . Ciprocin-Fluocin-Procin [Fluocinolone Acetonide] Other (See Comments)    unknown  . Ciprofloxacin Diarrhea  . Codeine Nausea Only  . Nitrofurantoin Monohyd Macro Nausea Only    ROS Review of Systems  Constitutional: Positive for fatigue. Negative for chills and fever.  HENT: Negative for ear discharge.   Respiratory: Positive for shortness of breath. Negative for cough and wheezing.   Cardiovascular: Negative for chest pain, palpitations and leg swelling.      Objective:    Physical Exam Vitals reviewed.  Constitutional:      Appearance: Normal appearance.  HENT:     Ears:     Comments: Cerumen impactions bilaterally. Cardiovascular:     Rate and Rhythm: Normal rate.  Pulmonary:     Effort: Pulmonary effort is normal.     Breath sounds: Normal breath sounds.  Musculoskeletal:     Right lower leg: No edema.     Left lower leg: No  edema.  Skin:    Comments: She has small linear laceration dorsum right hand proximally.  This is about 2 cm in length.  Nongaping.  Minimal surrounding erythema.  No warmth.  Nontender.  No drainage.  She has very small eschar right upper back trapezius area which is about 2 x 2 mm.  No nodular base.  No ulceration.  Neurological:     Mental Status: She is alert.     BP 128/72 (BP Location: Left Arm, Patient Position: Sitting, Cuff Size: Normal)   Pulse 60   Temp 98.4 F (36.9 C) (Oral)   Ht 5\' 3"  (1.6 m)   Wt 149 lb (67.6 kg)   SpO2 96%   BMI 26.39 kg/m  Wt Readings from Last 3 Encounters:  07/01/20 149 lb (67.6 kg)  01/10/20 146 lb 12.8 oz (66.6 kg)  01/08/20 145 lb 12.8 oz (66.1 kg)     Health Maintenance Due  Topic Date Due  . COVID-19 Vaccine (1) Never done  . INFLUENZA VACCINE  04/27/2020    There are no preventive care reminders to display for this patient.  Lab Results  Component Value Date   TSH 3.670 05/23/2018   Lab Results  Component Value Date   WBC 6.3 09/24/2019   HGB 11.0 (L) 09/24/2019   HCT 32.4 (L) 09/24/2019   MCV 92.0 09/24/2019   PLT 133.0 (L) 09/24/2019   Lab Results  Component Value Date   NA 145 10/01/2019   K 3.6 10/01/2019   CO2 32 10/01/2019   GLUCOSE 116 (H) 10/01/2019   BUN 8 10/01/2019   CREATININE 0.81 10/01/2019   BILITOT 0.9 09/24/2019   ALKPHOS 94 09/24/2019   AST 23 09/24/2019   ALT 22 09/24/2019   PROT 6.0 09/24/2019   ALBUMIN 3.9 09/24/2019   CALCIUM 8.7 10/01/2019   ANIONGAP 11 09/06/2019   GFR 67.66 10/01/2019   Lab Results  Component Value Date   CHOL 163 09/24/2019   Lab Results  Component Value Date   HDL 52.70 09/24/2019   Lab Results  Component Value Date   LDLCALC 135 (H) 06/06/2019   Lab Results  Component Value Date   TRIG 214.0 (H) 09/24/2019   Lab Results  Component Value Date   CHOLHDL 3 09/24/2019   Lab Results  Component Value Date   HGBA1C 5.3 10/12/2016      Assessment &  Plan:   #1  Laceration right hand.  This is superficial and occurred a week ago.  Tetanus is up-to-date.  No signs of secondary infection. -Clean daily with soap and water -Follow-up promptly for signs of secondary infection  #2 cerumen impactions bilaterally.  We discussed risk of cerumen removal including risk of bleeding, pain, low risk of perforation.  Using a curette removed cerumen from the left ear.  She did have a little bit of bleeding along the inferior portion of the canal and she is on Coumadin.  No evidence for trauma to the eardrum.  Patient tolerated well.  Right ear canal was irrigated and removal of plug of cerumen.  Eardrum appears normal.  Patient tolerated well  #3 small eschar right upper back.  This looks like more of sequelae of local injury and no evidence for skin cancer.  If not healing over the next few weeks return for biopsy.  #4 health maintenance -Flu vaccine given  #5 hyperlipidemia/dyslipidemia. -  Future lab order placed for lipid panel and hepatic panel  #6 chronic anticoagulation secondary to atrial fibrillation -Check CBC with future labs  No orders of the defined types were placed in this encounter.   Follow-up: No follow-ups on file.    [name], MD

## 2020-07-01 NOTE — Patient Instructions (Signed)
Set up lab work for next Wednesday.

## 2020-07-02 ENCOUNTER — Telehealth: Payer: Self-pay | Admitting: Family Medicine

## 2020-07-02 NOTE — Telephone Encounter (Signed)
Patient seen Dr. Elease Hashimoto yesterday and had her ears cleaned out and her left one bleed a little last night and today and now she can't hear out of her left ear.   She would like to know what she needs to do.  Please advise

## 2020-07-02 NOTE — Telephone Encounter (Signed)
BB-Plz see pt message on issues after ear lavage/plz advise/thx dmf

## 2020-07-02 NOTE — Telephone Encounter (Signed)
The wax was removed and there was no trauma to the eardrum .   The bleeding occurred where the wax was adherent to the skin (and she is on coumadin).   It is possible that she bleed into the canal further.  Let's see if we can get her in to office Friday to reassess.

## 2020-07-03 NOTE — Telephone Encounter (Signed)
Spoke to pt and she did not offer any additional information regarding symptoms. Informed pt that she would need to be seen to completely assess what is causing her symptoms. Pt agreed and I transferred pt back to the front to be scheduled.

## 2020-07-04 ENCOUNTER — Encounter: Payer: Self-pay | Admitting: Family Medicine

## 2020-07-04 ENCOUNTER — Other Ambulatory Visit: Payer: Self-pay | Admitting: Family Medicine

## 2020-07-04 ENCOUNTER — Other Ambulatory Visit: Payer: Self-pay

## 2020-07-04 ENCOUNTER — Ambulatory Visit (INDEPENDENT_AMBULATORY_CARE_PROVIDER_SITE_OTHER): Payer: Medicare Other | Admitting: Family Medicine

## 2020-07-04 VITALS — BP 138/72 | HR 68 | Temp 98.2°F | Ht 63.0 in | Wt 155.1 lb

## 2020-07-04 DIAGNOSIS — H9222 Otorrhagia, left ear: Secondary | ICD-10-CM

## 2020-07-04 NOTE — Progress Notes (Signed)
Established Patient Office Visit  Subjective:  Patient ID: Catherine [name], female    DOB: [birth year]  Age: 85 y.o. MRN: [id number]  CC: No chief complaint on file.   HPI [name] presents for decreased hearing left ear.  She has chronic atrial fibrillation is on Coumadin.  She came in a couple days ago and had cerumen in both ears.  We removed some cerumen in the left canal with curette and she had some adherent wax to the side of the canal and had little bit of mild oozing with blood when this was removed.  There was no brisk bleeding.  There was no evidence for eardrum trauma.  We placed cottonball in her external canal and she called back yesterday stating that her hearing was decreased in the left ear.  She has mild discomfort.  No active drainage today.  [name] to get her next INR next week.  No other bleeding complications currently.  No fever  Past Medical History:  Diagnosis Date  . Aortic stenosis, mild 07/26/2017  . Arthritis   . Asthma    last attack 02/2015  . Atrial fibrillation, chronic ([place])   . Cellulitis of left lower extremity 08/17/2016   Ulcer associated with severe venous insufficiency  . Chronic anticoagulation   . Chronic diastolic CHF (congestive heart failure) ([place])   . Chronic kidney disease    "RIGHT MANY KIDNEY INFECTIONS AND STONES"  . COPD (chronic obstructive pulmonary disease) ([place])   . Coronary artery disease   . Dizziness   . H/O: rheumatic fever   . Heart murmur   . Hypertension   . PONV (postoperative nausea and vomiting)    ' SOMETIMES', BUT NOT ALWAYS"  . S/P Maze operation for atrial fibrillation 10/14/2016   Complete bilateral atrial lesion set using cryothermy and bipolar radiofrequency ablation - atrial appendage was not treated due to previous surgical procedure (open mitral commissurotomy)  . S/P mitral valve replacement with bioprosthetic valve 10/14/2016   29 mm Medtronic Mosaic porcine bioprosthetic tissue valve  . UTI  (urinary tract infection)   . Valvular heart disease    Has mitral stenosis with prior mitral commissurotomy in 1970    Past Surgical History:  Procedure Laterality Date  . ABDOMINAL HYSTERECTOMY  1983   endometriosis  . APPENDECTOMY    . BACK SURGERY     neurosurgery x2  . CARDIAC CATHETERIZATION    . CARDIAC CATHETERIZATION N/A 08/03/2016   Procedure: Right/Left Heart Cath and Coronary Angiography;  Surgeon: Peter M Martinique, MD;  Location: [facility];  Service: Cardiovascular;  Laterality: N/A;  . cataract surg    . CHOLECYSTECTOMY N/A 05/30/2015   Procedure: LAPAROSCOPIC CHOLECYSTECTOMY WITH INTRAOPERATIVE CHOLANGIOGRAM;  Surgeon: Excell Seltzer, MD;  Location: [facility];  Service: General;  Laterality: N/A;  . COLONOSCOPY    . CORONARY ARTERY BYPASS GRAFT N/A 10/14/2016   Procedure: CORONARY ARTERY BYPASS GRAFTING (CABG);  Surgeon: Rexene Alberts, MD;  Location: [facility];  Service: Open Heart Surgery;  Laterality: N/A;  . EYE SURGERY    . MAZE N/A 10/14/2016   Procedure: MAZE;  Surgeon: Rexene Alberts, MD;  Location: [facility];  Service: Open Heart Surgery;  Laterality: N/A;  . MITRAL VALVE REPLACEMENT N/A 10/14/2016   Procedure: REDO MITRAL VALVE REPLACEMENT (MVR);  Surgeon: Rexene Alberts, MD;  Location: [facility];  Service: Open Heart Surgery;  Laterality: N/A;  . MITRAL VALVE SURGERY Left 1970   Open  mitral commissurotomy via left thoracotomy approach  . TEE WITHOUT CARDIOVERSION N/A 07/05/2016   Procedure: TRANSESOPHAGEAL ECHOCARDIOGRAM (TEE);  Surgeon: [name], MD;  Location: [facility];  Service: Cardiovascular;  Laterality: N/A;  . TEE WITHOUT CARDIOVERSION N/A 10/14/2016   Procedure: TRANSESOPHAGEAL ECHOCARDIOGRAM (TEE);  Surgeon: Rexene Alberts, MD;  Location: [facility];  Service: Open Heart Surgery;  Laterality: N/A;    Family History  Problem Relation Age of Onset  . Leukemia Father   . Breast cancer Neg Hx     Social History   Socioeconomic History  . Marital  status: Married    Spouse name: Not on file  . Number of children: Not on file  . Years of education: Not on file  . Highest education level: Not on file  Occupational History  . Not on file  Tobacco Use  . Smoking status: Former Smoker    Packs/day: 1.00    Years: 15.00    Pack years: 15.00    Types: Cigarettes    Quit date: 09/28/1975    Years since quitting: 44.7  . Smokeless tobacco: Never Used  Vaping Use  . Vaping Use: Never used  Substance and Sexual Activity  . Alcohol use: No    Alcohol/week: 0.0 standard drinks  . Drug use: No  . Sexual activity: Yes  Other Topics Concern  . Not on file  Social History Narrative  . Not on file   Social Determinants of Health   Financial Resource Strain: High Risk  . Difficulty of Paying Living Expenses: Hard  Food Insecurity:   . Worried About Charity fundraiser in the Last Year: Not on file  . Ran Out of Food in the Last Year: Not on file  Transportation Needs: No Transportation Needs  . Lack of Transportation (Medical): No  . Lack of Transportation (Non-Medical): No  Physical Activity:   . Days of Exercise per Week: Not on file  . Minutes of Exercise per Session: Not on file  Stress:   . Feeling of Stress : Not on file  Social Connections:   . Frequency of Communication with Friends and Family: Not on file  . Frequency of Social Gatherings with Friends and Family: Not on file  . Attends Religious Services: Not on file  . Active Member of Clubs or Organizations: Not on file  . Attends Archivist Meetings: Not on file  . Marital Status: Not on file  Intimate Partner Violence:   . Fear of Current or Ex-Partner: Not on file  . Emotionally Abused: Not on file  . Physically Abused: Not on file  . Sexually Abused: Not on file    Outpatient Medications Prior to Visit  Medication Sig Dispense Refill  . acetaminophen (TYLENOL) 500 MG tablet Take 500 mg by mouth daily.     Marland Kitchen albuterol (PROVENTIL HFA;VENTOLIN HFA)  108 (90 Base) MCG/ACT inhaler Inhale 2 puffs into the lungs every 6 (six) hours as needed for wheezing or shortness of breath. 1 Inhaler 0  . aspirin EC 81 MG EC tablet Take 1 tablet (81 mg total) by mouth daily.    . budesonide-formoterol (SYMBICORT) 80-4.5 MCG/ACT inhaler Inhale 2 puffs into the lungs 2 (two) times daily. 1 Inhaler 11  . cephALEXin (KEFLEX) 250 MG capsule TAKE (1) CAPSULE DAILY 30 capsule 3  . diazepam (VALIUM) 5 MG tablet TAKE ONE TABLET AT BEDTIME 30 tablet 5  . doxazosin (CARDURA) 4 MG tablet TAKE 1 TABLET DAILY. STOP LOSARTAN 90  tablet 3  . fluconazole (DIFLUCAN) 150 MG tablet TAKE 1 TABLET BY MOUTH ONCE FOR 1 DOSE 1 tablet 0  . furosemide (LASIX) 40 MG tablet TAKE 1 TABLET DAILY. MAY TAKE 1 EXTRA AS NEEDED FOR SWELLING 60 tablet 6  . metoprolol succinate (TOPROL-XL) 25 MG 24 hr tablet Take 1 tablet (25 mg total) by mouth daily. 90 tablet 1  . Multiple Vitamins-Minerals (HAIR/SKIN/NAILS/BIOTIN PO) Take 1 tablet by mouth daily.    Marland Kitchen OVER THE COUNTER MEDICATION Apply 1 application topically at bedtime as needed (Leg Pain). Magni-Life topical cream for leg pain.    . potassium chloride (KLOR-CON) 10 MEQ tablet TAKE 6 TABLETS DAILY 180 tablet 1  . rosuvastatin (CRESTOR) 5 MG tablet TAKE 1/2 TO 1 TABLET ON MONDAY AND FRIDAY AS TOLERATED 20 tablet 5  . warfarin (COUMADIN) 2 MG tablet Take 1 tablet daily or take as directed by anticoagulation clinic 100 tablet 1  . cephALEXin (KEFLEX) 250 MG capsule TAKE (1) CAPSULE DAILY 30 capsule 1  . ondansetron (ZOFRAN ODT) 4 MG disintegrating tablet Take 1 tablet (4 mg total) by mouth every 8 (eight) hours as needed for nausea or vomiting. 15 tablet 0   No facility-administered medications prior to visit.    Allergies  Allergen Reactions  . Aldactone [Spironolactone] Other (See Comments)    dyspnea  . Amoxicillin Palpitations    Tachycardia Has patient had a PCN reaction causing immediate rash, facial/tongue/throat swelling, SOB or  lightheadedness with hypotension: no Has patient had a PCN reaction causing severe rash involving mucus membranes or skin necrosis: {no Has patient had a PCN reaction that required hospitalization {no Has patient had a PCN reaction occurring within the last 10 years: {yes If all of the above answers are "NO", then may proceed with Cephalosporin use.  . Diltiazem Other (See Comments)    Causing headaches   . Flagyl [Metronidazole Hcl] Other (See Comments)    Causing headaches    . Flovent [Fluticasone Propionate] Other (See Comments)    Leg cramps  . Gabapentin Swelling  . Lyrica [Pregabalin] Swelling  . Quinidine Diarrhea and Other (See Comments)    Fever diarrhea  . Simvastatin Other (See Comments)    Leg pain, myalgia  . Tramadol Nausea Only  . Verapamil Other (See Comments)    myalgias  . Amlodipine     Low extremity edema  . Zetia [Ezetimibe]     LEG CRAMPS   . Ace Inhibitors Other (See Comments)    unknown  . Benazepril Hcl Cough  . Ciprocin-Fluocin-Procin [Fluocinolone Acetonide] Other (See Comments)    unknown  . Ciprofloxacin Diarrhea  . Codeine Nausea Only  . Nitrofurantoin Monohyd Macro Nausea Only    ROS Review of Systems  Constitutional: Negative for chills and fever.  HENT: Negative for ear discharge.   Neurological: Negative for dizziness and headaches.      Objective:    Physical Exam HENT:     Ears:     Comments: Eardrum and ear canal appear normal.  Left canal reveals some clotted blood near the outer part of the canal.  This is not fully occluding canal.  Portion of eardrum visualized is normal.  No active bleeding.    BP 138/72 (BP Location: Left Arm, Patient Position: Sitting, Cuff Size: Large)   Pulse 68   Temp 98.2 F (36.8 C) (Oral)   Ht 5\' 3"  (1.6 m)   Wt 155 lb 2 oz (70.4 kg)   SpO2 94%  BMI 27.48 kg/m  Wt Readings from Last 3 Encounters:  07/04/20 155 lb 2 oz (70.4 kg)  07/01/20 149 lb (67.6 kg)  01/10/20 146 lb 12.8 oz  (66.6 kg)     Health Maintenance Due  Topic Date Due  . COVID-19 Vaccine (1) Never done    There are no preventive care reminders to display for this patient.  Lab Results  Component Value Date   TSH 3.670 05/23/2018   Lab Results  Component Value Date   WBC 6.3 09/24/2019   HGB 11.0 (L) 09/24/2019   HCT 32.4 (L) 09/24/2019   MCV 92.0 09/24/2019   PLT 133.0 (L) 09/24/2019   Lab Results  Component Value Date   NA 145 10/01/2019   K 3.6 10/01/2019   CO2 32 10/01/2019   GLUCOSE 116 (H) 10/01/2019   BUN 8 10/01/2019   CREATININE 0.81 10/01/2019   BILITOT 0.9 09/24/2019   ALKPHOS 94 09/24/2019   AST 23 09/24/2019   ALT 22 09/24/2019   PROT 6.0 09/24/2019   ALBUMIN 3.9 09/24/2019   CALCIUM 8.7 10/01/2019   ANIONGAP 11 09/06/2019   GFR 67.66 10/01/2019   Lab Results  Component Value Date   CHOL 163 09/24/2019   Lab Results  Component Value Date   HDL 52.70 09/24/2019   Lab Results  Component Value Date   LDLCALC 135 (H) 06/06/2019   Lab Results  Component Value Date   TRIG 214.0 (H) 09/24/2019   Lab Results  Component Value Date   CHOLHDL 3 09/24/2019   Lab Results  Component Value Date   HGBA1C 5.3 10/12/2016      Assessment & Plan:   Clotted blood left ear canal.  Patient on Coumadin.  Removal of cerumen with membrane attached to skin and when this was pulled off she had little bit of bleeding last week.  She now has clot but this should dissolve over time.  She is here next Wed for INR check and we can look again at ear at that time.     No orders of the defined types were placed in this encounter.   Follow-up: No follow-ups on file.    [name], MD

## 2020-07-04 NOTE — Patient Instructions (Addendum)
You have some clotted blood in the canal  This will break down over time  Would avoid agitating the clot at this time to avoid recurrent bleed.   Could use a couple of drops of hydrogen peroxide per day but would wait until next week to start.    Set up office follow up next Wed at 1:15 (15 minute visit OK)

## 2020-07-08 ENCOUNTER — Other Ambulatory Visit: Payer: Self-pay

## 2020-07-09 ENCOUNTER — Other Ambulatory Visit: Payer: Self-pay

## 2020-07-09 ENCOUNTER — Other Ambulatory Visit: Payer: Medicare Other

## 2020-07-09 ENCOUNTER — Ambulatory Visit: Payer: Medicare Other | Admitting: Internal Medicine

## 2020-07-09 ENCOUNTER — Encounter: Payer: Self-pay | Admitting: Internal Medicine

## 2020-07-09 ENCOUNTER — Ambulatory Visit (INDEPENDENT_AMBULATORY_CARE_PROVIDER_SITE_OTHER): Payer: Medicare Other

## 2020-07-09 ENCOUNTER — Encounter: Payer: Self-pay | Admitting: Family Medicine

## 2020-07-09 ENCOUNTER — Ambulatory Visit (INDEPENDENT_AMBULATORY_CARE_PROVIDER_SITE_OTHER): Payer: Medicare Other | Admitting: General Practice

## 2020-07-09 ENCOUNTER — Ambulatory Visit (INDEPENDENT_AMBULATORY_CARE_PROVIDER_SITE_OTHER): Payer: Medicare Other | Admitting: Family Medicine

## 2020-07-09 VITALS — BP 108/72 | HR 100 | Temp 98.3°F | Ht 63.0 in | Wt 151.3 lb

## 2020-07-09 DIAGNOSIS — Z79899 Other long term (current) drug therapy: Secondary | ICD-10-CM | POA: Diagnosis not present

## 2020-07-09 DIAGNOSIS — R0602 Shortness of breath: Secondary | ICD-10-CM | POA: Diagnosis not present

## 2020-07-09 DIAGNOSIS — J449 Chronic obstructive pulmonary disease, unspecified: Secondary | ICD-10-CM | POA: Diagnosis not present

## 2020-07-09 DIAGNOSIS — R0609 Other forms of dyspnea: Secondary | ICD-10-CM

## 2020-07-09 DIAGNOSIS — H9222 Otorrhagia, left ear: Secondary | ICD-10-CM

## 2020-07-09 DIAGNOSIS — J90 Pleural effusion, not elsewhere classified: Secondary | ICD-10-CM | POA: Diagnosis not present

## 2020-07-09 DIAGNOSIS — I4891 Unspecified atrial fibrillation: Secondary | ICD-10-CM | POA: Diagnosis not present

## 2020-07-09 DIAGNOSIS — R0600 Dyspnea, unspecified: Secondary | ICD-10-CM

## 2020-07-09 DIAGNOSIS — E785 Hyperlipidemia, unspecified: Secondary | ICD-10-CM | POA: Diagnosis not present

## 2020-07-09 DIAGNOSIS — J811 Chronic pulmonary edema: Secondary | ICD-10-CM | POA: Diagnosis not present

## 2020-07-09 DIAGNOSIS — I517 Cardiomegaly: Secondary | ICD-10-CM | POA: Diagnosis not present

## 2020-07-09 LAB — TSH: TSH: 3.95 u[IU]/mL (ref 0.35–4.50)

## 2020-07-09 LAB — SEDIMENTATION RATE: Sed Rate: 3 mm/hr (ref 0–30)

## 2020-07-09 LAB — BRAIN NATRIURETIC PEPTIDE: Pro B Natriuretic peptide (BNP): 253 pg/mL — ABNORMAL HIGH (ref 0.0–100.0)

## 2020-07-09 LAB — POCT INR: INR: 2.9 (ref 2.0–3.0)

## 2020-07-09 MED ORDER — ALBUTEROL SULFATE HFA 108 (90 BASE) MCG/ACT IN AERS: 2.0000 | INHALATION_SPRAY | RESPIRATORY_TRACT | 2 refills | Status: DC | PRN

## 2020-07-09 NOTE — Progress Notes (Signed)
Established Patient Office Visit  Subjective:  Patient ID: Catherine [name], female    DOB: [birth year]  Age: 85 y.o. MRN: [id number]  CC:  Chief Complaint  Patient presents with  . Ear Problem    left bleeding is still feels sore  . Follow-up    place on the back to chk on, using antibiotic    HPI Catherine [name] presents for left ear canal check.  She had some bleeding from the canal after removing some wax last week.  There was really no trauma but she had some wax that was strongly adherent with wax membrane to the skin.  Because she is on Coumadin she had some oozing when this was pulled out.  No TM trauma.  She has been using some peroxide drops.  No ongoing bleeding.  No significant dizziness  Past Medical History:  Diagnosis Date  . Aortic stenosis, mild 07/26/2017  . Arthritis   . Asthma    last attack 02/2015  . Atrial fibrillation, chronic ([place])   . Cellulitis of left lower extremity 08/17/2016   Ulcer associated with severe venous insufficiency  . Chronic anticoagulation   . Chronic diastolic CHF (congestive heart failure) ([place])   . Chronic kidney disease    "RIGHT MANY KIDNEY INFECTIONS AND STONES"  . COPD (chronic obstructive pulmonary disease) ([place])   . Coronary artery disease   . Dizziness   . H/O: rheumatic fever   . Heart murmur   . Hypertension   . PONV (postoperative nausea and vomiting)    ' SOMETIMES', BUT NOT ALWAYS"  . S/P Maze operation for atrial fibrillation 10/14/2016   Complete bilateral atrial lesion set using cryothermy and bipolar radiofrequency ablation - atrial appendage was not treated due to previous surgical procedure (open mitral commissurotomy)  . S/P mitral valve replacement with bioprosthetic valve 10/14/2016   29 mm Medtronic Mosaic porcine bioprosthetic tissue valve  . UTI (urinary tract infection)   . Valvular heart disease    Has mitral stenosis with prior mitral commissurotomy in 1970    Past Surgical History:  Procedure  Laterality Date  . ABDOMINAL HYSTERECTOMY  1983   endometriosis  . APPENDECTOMY    . BACK SURGERY     neurosurgery x2  . CARDIAC CATHETERIZATION    . CARDIAC CATHETERIZATION N/A 08/03/2016   Procedure: Right/Left Heart Cath and Coronary Angiography;  Surgeon: Peter M Martinique, MD;  Location: [facility];  Service: Cardiovascular;  Laterality: N/A;  . cataract surg    . CHOLECYSTECTOMY N/A 05/30/2015   Procedure: LAPAROSCOPIC CHOLECYSTECTOMY WITH INTRAOPERATIVE CHOLANGIOGRAM;  Surgeon: Excell Seltzer, MD;  Location: [facility];  Service: General;  Laterality: N/A;  . COLONOSCOPY    . CORONARY ARTERY BYPASS GRAFT N/A 10/14/2016   Procedure: CORONARY ARTERY BYPASS GRAFTING (CABG);  Surgeon: Rexene Alberts, MD;  Location: [facility];  Service: Open Heart Surgery;  Laterality: N/A;  . EYE SURGERY    . MAZE N/A 10/14/2016   Procedure: MAZE;  Surgeon: Rexene Alberts, MD;  Location: [facility];  Service: Open Heart Surgery;  Laterality: N/A;  . MITRAL VALVE REPLACEMENT N/A 10/14/2016   Procedure: REDO MITRAL VALVE REPLACEMENT (MVR);  Surgeon: Rexene Alberts, MD;  Location: [facility];  Service: Open Heart Surgery;  Laterality: N/A;  . MITRAL VALVE SURGERY Left 1970   Open mitral commissurotomy via left thoracotomy approach  . TEE WITHOUT CARDIOVERSION N/A 07/05/2016   Procedure: TRANSESOPHAGEAL ECHOCARDIOGRAM (TEE);  Surgeon: [name], MD;  Location: [facility];  Service: Cardiovascular;  Laterality: N/A;  . TEE WITHOUT CARDIOVERSION N/A 10/14/2016   Procedure: TRANSESOPHAGEAL ECHOCARDIOGRAM (TEE);  Surgeon: Rexene Alberts, MD;  Location: [facility];  Service: Open Heart Surgery;  Laterality: N/A;    Family History  Problem Relation Age of Onset  . Leukemia Father   . Breast cancer Neg Hx     Social History   Socioeconomic History  . Marital status: Married    Spouse name: Not on file  . Number of children: Not on file  . Years of education: Not on file  . Highest education level: Not on file    Occupational History  . Not on file  Tobacco Use  . Smoking status: Former Smoker    Packs/day: 1.00    Years: 15.00    Pack years: 15.00    Types: Cigarettes    Quit date: 09/28/1975    Years since quitting: 44.8  . Smokeless tobacco: Never Used  Vaping Use  . Vaping Use: Never used  Substance and Sexual Activity  . Alcohol use: No    Alcohol/week: 0.0 standard drinks  . Drug use: No  . Sexual activity: Yes  Other Topics Concern  . Not on file  Social History Narrative  . Not on file   Social Determinants of Health   Financial Resource Strain: High Risk  . Difficulty of Paying Living Expenses: Hard  Food Insecurity:   . Worried About Charity fundraiser in the Last Year: Not on file  . Ran Out of Food in the Last Year: Not on file  Transportation Needs: No Transportation Needs  . Lack of Transportation (Medical): No  . Lack of Transportation (Non-Medical): No  Physical Activity:   . Days of Exercise per Week: Not on file  . Minutes of Exercise per Session: Not on file  Stress:   . Feeling of Stress : Not on file  Social Connections:   . Frequency of Communication with Friends and Family: Not on file  . Frequency of Social Gatherings with Friends and Family: Not on file  . Attends Religious Services: Not on file  . Active Member of Clubs or Organizations: Not on file  . Attends Archivist Meetings: Not on file  . Marital Status: Not on file  Intimate Partner Violence:   . Fear of Current or Ex-Partner: Not on file  . Emotionally Abused: Not on file  . Physically Abused: Not on file  . Sexually Abused: Not on file    Outpatient Medications Prior to Visit  Medication Sig Dispense Refill  . acetaminophen (TYLENOL) 500 MG tablet Take 500 mg by mouth daily.     Marland Kitchen albuterol (VENTOLIN HFA) 108 (90 Base) MCG/ACT inhaler Inhale 2 puffs into the lungs every 4 (four) hours as needed for wheezing or shortness of breath. 1 each 2  . aspirin EC 81 MG EC tablet Take  1 tablet (81 mg total) by mouth daily.    . budesonide-formoterol (SYMBICORT) 80-4.5 MCG/ACT inhaler Inhale 2 puffs into the lungs 2 (two) times daily. 1 Inhaler 11  . cephALEXin (KEFLEX) 250 MG capsule TAKE (1) CAPSULE DAILY 30 capsule 3  . diazepam (VALIUM) 5 MG tablet TAKE ONE TABLET AT BEDTIME 30 tablet 5  . doxazosin (CARDURA) 4 MG tablet TAKE 1 TABLET DAILY. STOP LOSARTAN 90 tablet 3  . fluconazole (DIFLUCAN) 150 MG tablet TAKE 1 TABLET BY MOUTH ONCE FOR 1 DOSE 1 tablet 0  . furosemide (LASIX)  40 MG tablet TAKE 1 TABLET DAILY. MAY TAKE 1 EXTRA AS NEEDED FOR SWELLING 60 tablet 6  . metoprolol succinate (TOPROL-XL) 25 MG 24 hr tablet Take 1 tablet (25 mg total) by mouth daily. 90 tablet 1  . Multiple Vitamins-Minerals (HAIR/SKIN/NAILS/BIOTIN PO) Take 1 tablet by mouth daily.    Marland Kitchen OVER THE COUNTER MEDICATION Apply 1 application topically at bedtime as needed (Leg Pain). Magni-Life topical cream for leg pain.    . potassium chloride (KLOR-CON) 10 MEQ tablet TAKE 6 TABLETS DAILY 180 tablet 1  . rosuvastatin (CRESTOR) 5 MG tablet TAKE 1/2 TO 1 TABLET ON MONDAY AND FRIDAY AS TOLERATED 20 tablet 5  . warfarin (COUMADIN) 2 MG tablet Take 1 tablet daily or take as directed by anticoagulation clinic 100 tablet 1   No facility-administered medications prior to visit.    Allergies  Allergen Reactions  . Aldactone [Spironolactone] Other (See Comments)    dyspnea  . Amoxicillin Palpitations    Tachycardia Has patient had a PCN reaction causing immediate rash, facial/tongue/throat swelling, SOB or lightheadedness with hypotension: no Has patient had a PCN reaction causing severe rash involving mucus membranes or skin necrosis: {no Has patient had a PCN reaction that required hospitalization {no Has patient had a PCN reaction occurring within the last 10 years: {yes If all of the above answers are "NO", then may proceed with Cephalosporin use.  . Diltiazem Other (See Comments)    Causing  headaches   . Flagyl [Metronidazole Hcl] Other (See Comments)    Causing headaches    . Flovent [Fluticasone Propionate] Other (See Comments)    Leg cramps  . Gabapentin Swelling  . Lyrica [Pregabalin] Swelling  . Quinidine Diarrhea and Other (See Comments)    Fever diarrhea  . Simvastatin Other (See Comments)    Leg pain, myalgia  . Tramadol Nausea Only  . Verapamil Other (See Comments)    myalgias  . Amlodipine     Low extremity edema  . Zetia [Ezetimibe]     LEG CRAMPS   . Ace Inhibitors Other (See Comments)    unknown  . Benazepril Hcl Cough  . Ciprocin-Fluocin-Procin [Fluocinolone Acetonide] Other (See Comments)    unknown  . Ciprofloxacin Diarrhea  . Codeine Nausea Only  . Nitrofurantoin Monohyd Macro Nausea Only    ROS Review of Systems  Constitutional: Negative for chills and fever.  HENT: Negative for congestion, ear discharge and ear pain.       Objective:    Physical Exam HENT:     Ears:     Comments: Right canal and eardrum are normal.  Left canal reveals clot along the inferior portion that is contracting in size about 50% the size it was last week.  Portion of eardrum visualized is normal    BP 108/72   Pulse 100   Temp 98.3 F (36.8 C)   Ht 5\' 3"  (1.6 m)   Wt 151 lb 4.8 oz (68.6 kg)   SpO2 98%   BMI 26.80 kg/m  Wt Readings from Last 3 Encounters:  07/09/20 151 lb 4.8 oz (68.6 kg)  07/09/20 152 lb 6.4 oz (69.1 kg)  07/04/20 155 lb 2 oz (70.4 kg)     There are no preventive care reminders to display for this patient.  There are no preventive care reminders to display for this patient.  Lab Results  Component Value Date   TSH 3.670 05/23/2018   Lab Results  Component Value Date   WBC 6.3 09/24/2019  HGB 11.0 (L) 09/24/2019   HCT 32.4 (L) 09/24/2019   MCV 92.0 09/24/2019   PLT 133.0 (L) 09/24/2019   Lab Results  Component Value Date   NA 145 10/01/2019   K 3.6 10/01/2019   CO2 32 10/01/2019   GLUCOSE 116 (H) 10/01/2019    BUN 8 10/01/2019   CREATININE 0.81 10/01/2019   BILITOT 0.9 09/24/2019   ALKPHOS 94 09/24/2019   AST 23 09/24/2019   ALT 22 09/24/2019   PROT 6.0 09/24/2019   ALBUMIN 3.9 09/24/2019   CALCIUM 8.7 10/01/2019   ANIONGAP 11 09/06/2019   GFR 67.66 10/01/2019   Lab Results  Component Value Date   CHOL 163 09/24/2019   Lab Results  Component Value Date   HDL 52.70 09/24/2019   Lab Results  Component Value Date   LDLCALC 135 (H) 06/06/2019   Lab Results  Component Value Date   TRIG 214.0 (H) 09/24/2019   Lab Results  Component Value Date   CHOLHDL 3 09/24/2019   Lab Results  Component Value Date   HGBA1C 5.3 10/12/2016      Assessment & Plan:   Small blood clot in left ear canal from recent cerumen removal in patient on Coumadin.  Clot is gradually breaking down with time.  There is no sign of eardrum trauma.  Continue peroxide drops for another week or so  -She is getting her INR repeated today through our Coumadin clinic  No orders of the defined types were placed in this encounter.   Follow-up: No follow-ups on file.    [name], MD

## 2020-07-09 NOTE — Assessment & Plan Note (Addendum)
Quit smoking 1977 - Spirometry 03/24/15  FEV1  1.41 (70%) ratio 65  - 03/26/2015  Walked RA x 3 laps @ 185 ft each stopped due to  End of study, brisk pace, no sob or desat  - Spirometry 07/29/2016  FEV1 1.30 (66%)  Ratio 66   - Spirometry 12/20/2017  FEV1 1.34 (69%)  Ratio 71 with min curvature p am symb 80 and saba w/in 1 hour - 12/20/2017  Walked RA x 3 laps @ 185 ft each stopped due to  End of study, moderately fast pace, no sob or desat     - 07/09/2020  After extensive coaching inhaler device,  effectiveness =    95%  -  07/09/2020   Walked RA  3 laps @ approx 271ft each @ avg  pace  stopped due to end of study, not due to sob, with sats still 91% at end   Symptoms are markedly disproportionate to objective findings and not clear to what extent this is actually a pulmonary  problem but pt does appear to have difficult to sort out respiratory symptoms of unknown origin for which  DDX  = almost all start with A and  include Adherence, Ace Inhibitors, Acid Reflux, Active Sinus Disease, Alpha 1 Antitripsin deficiency, Anxiety masquerading as Airways dz,  ABPA,  Allergy(esp in young), Aspiration (esp in elderly), Adverse effects of meds,  Active smoking or Vaping, A bunch of PE's/clot burden (a few small clots can't cause this syndrome unless there is already severe underlying pulm or vascular dz with poor reserve),  Anemia or thyroid disorder, plus two Bs  = Bronchiectasis and Beta blocker use..and one C= CHF     Adherence is always the initial "prime suspect" and is a multilayered concern that requires a "trust but verify" approach in every patient - starting with knowing how to use medications, especially inhalers, correctly, keeping up with refills and understanding the fundamental difference between maintenance and prns vs those medications only taken for a very short course and then stopped and not refilled.  - see hfa teaching - return with all meds in hand using a trust but verify approach to  confirm accurate Medication  Reconciliation The principal here is that until we are certain that the  patients are doing what we've asked, it makes no sense to ask them to do more.   ? Allergy/asthma > symb 80 2bid approp I spent extra time with pt today reviewing appropriate use of albuterol for prn use on exertion with the following points: 1) saba is for relief of sob that does not improve by walking a slower pace or resting but rather if the pt does not improve after trying this first. 2) If the pt is convinced, as many are, that saba helps recover from activity faster then it's easy to tell if this is the case by re-challenging : ie stop, take the inhaler, then p 5 minutes try the exact same activity (intensity of workload) that just caused the symptoms and see if they are substantially diminished or not after saba 3) if there is an activity that reproducibly causes the symptoms, try the saba 15 min before the activity on alternate days   If in fact the saba really does help, then fine to continue to use it prn but advised may need to look closer at the maintenance regimen being used to achieve better control of airways disease with exertion.    ? Anxiety/depression/ deconditioning  > usually at the  bottom of this list of usual suspects but should be much higher on this pt's based on H and P and note already on psychotropics and may interfere with adherence and also interpretation of response or lack thereof to symptom management which can be quite subjective.   ? Anemia/thyroid dz  >  Ruled out today   ? A bunch of PE s > unlikely on coumadin s/p MV Surgery  ? BB effects > unlikely on  Relatively low doses of   ? CHF  > intermediate bnp but no overt chf on cxr   Overall pattern is one of fatigue/ deconditioning ? Depression than limiting cardiopulmonary mechanism here so no change in medications needed   >>> f/u in 6 weeks           Each maintenance medication was reviewed in  detail including emphasizing most importantly the difference between maintenance and prns and under what circumstances the prns are to be triggered using an action plan format where appropriate.  Total time for H and P, chart review, counseling, teaching device (saba hfa) and generating customized AVS unique to this office visit / charting =  33 min

## 2020-07-09 NOTE — Progress Notes (Signed)
Subjective:     Patient ID: Catherine [name], female   DOB: [birth year],   MRN: [id number]    Brief patient profile:  84  yowf quit smoking in 1977 with GOLD II criteria 6/29016  at her best can do 5 days/7 walk x one half mile 3.2 mph flat but not doing as well since spring 2016 then admitted p flare of copd with new kidney stone/GB pain    Admit date: 03/15/2015 Discharge date: 03/17/2015    Discharge Condition: stable Diet recommendation: low fat, low sodium  Discharge Diagnoses:  Principal Problem:  Left flank pain/ Nephrolithiasis  Right upper quadrant pain-chronic cholecystitis  Essential hypertension  Atrial fibrillation, chronic  Warfarin-induced coagulopathy  Valvular heart disease  COPD (chronic obstructive pulmonary disease)  History of present illness:  -85 year old female who was discharged from the hospital on 6/17 after being treated for a COPD exacerbation. She returned to the hospital on 6/18 with severe left-sided flank pain. The pain eventually moved to left anterior abdomen and then the groin and then resolved. A CT scan of the abdomen and pelvis was done which revealed a forniceal rupture and a 3 mm calculus at the left UV junction or just inside the bladder. This was consistent with her presentation and at this time she has likely passed the stone. The CT scan above incidentally found chronic large gallstone. Further imaging with the ultrasound revealed acute cholecystitis with a 2.6 cm gallstone and possible additional stones. The patient is admitted to intermittent right upper quadrant pain but no significant issues with vomiting or diarrhea.  Hospital Course:  Principal Problem:  Acute cholecystitis? -As mentioned above, CT scan revealed a large gallstone and ultrasound was suspicious of acute cholecystitis -HIDA negative for acute cholecystitis therefore she does not currently have acute cholecystitis however surgery does suspect she has a chronic  cholecystitis as she does complain of having intermittent right upper quadrant pain for the past couple of months -Surgery (Catherine Excell Seltzer) recommending surgery at a later date for chronic cholecystitis and is asking for medical clearance for surgery  Active Problems: Left sided nephrolithiasis with forniceal rupture-discussed with urology -no further management necessary as her pain has resolved indicating passage of the stone  COPD flare -Was admitted for this and discharged on the 17th -has not been hypoxic or wheezing during the hospital stay but continues to have a cough with yellow sputum   Essential hypertension -Atenolol and Maxzide held on admission as she was bradycardic and has acute renal failure - ARF resolved- can resume Losartan   Atrial fibrillation, chronic -she was bradycardic-Atenolol was on hold - HR was in 50-60 range- had a-fib with HR in low 100s for almost 5 min this AM- will resume Atenolol at 12.5 mg daily- cont to hold Digoxin   Warfarin-induced coagulopathy -CHADS Vasc 4 -INR 3.57 on admission - Coumadin was hold for possible surgery- INR 1.49- no surgery planned for this admission- can go back on coumadin takes 2 mg on add days except Wed when she takes 1mg - advised to go back to 2 mg daily and have INR checked in 3 days by PCP   Moderate to severe mitral regurgitation -Unchanged from prior echo in 2015   03/24/2015 1st [facility] office visit/ Catherine [name] / pre op consultation  Chief Complaint  Patient presents with   HFU    Breathing has improved. Her cough has improved some, but she still occ coughs up yellow sputum.   at baseline using saba twice  weekly then yardwrok on   03/07/15  then hackiing 6/11 and admit   03/11/15 with copd/ab  Still some am cough but otherwise back to baseline on symb 80 2bid  Not limited by breathing from desired activities  rec Continue symbicort 80 Take 2 puffs first thing in am and then another 2 puffs about 12 hours later  indefinitely since you had an attack and proair didn't help Only use your albuterol (proair) as a rescue medication You are cleared for gall bladder surgery > did  Fine     07/29/2016  Pulmonary consultation /Catherine [name] re: unexplained sob in pt with MS/ GOLD II criteria copd  Chief Complaint  Patient presents with   Pulmonary Consult    Referred by Catherine [name]. Pt states that she is going to have mitral valve replacement soon. She c/o increased SOB since her last visit in 2016. She gets SOB doing housework.     progressive doe x year not able to trigger hfa effectively (at least a one second delay)  Previously able to do half mile on a treadmill  X  3.5 mph and flat now  2.2 x 0.2 of a mile then stops due to sob occ orthopnea/ maybe once a month Always recovers within a few minutes   rec Plan A = Automatic = Symbicort 80 Take 2 puffs first thing in am and then another 2 puffs about 12 hours later.  Work on inhaler technique:  - late add  If really needs hfa may be required to use spacer as could never trigger the mdi at onset of insp, only > 1 sec prior Plan B = Backup Only use your albuterol as rescue    Oct 14 2016  MVR [name]> marked improvement in doe   Phone call  12/16/17 Catherine [name] She saw ppc Catherine Post, MD 12/13/17 for fatigue She called cardiology 12/16/2017 for dyspnea for a week and then asked to call here  Please take prednisone 40 mg x1 day, then 30 mg x1 day, then 20 mg x1 day, then 10 mg x1 day, and then 5 mg x1 day and stop   -     12/20/2017 acute extended ov/Catherine [name] re:  ? aecopd  Chief Complaint  Patient presents with   Acute Visit    Increased SOB since 12/10/17.  She states she gets SOB "anytime" sometimes with just talking. She was prescribe pred taper and this helped some.   sob at rest s relief from albuterol  "like my insides are quivering"  - symptoms better with walking (see walk today)  Some better with prednisone No cough/ some sensation of strangling on  water  Changed from full diazepam 5 mg at hs to one half around 3 weeks prior to onset of "quivering" and has really not slept well at all since this change was made ? Around 11/24/17  - not having noct cough / wheeze or early am exac rec Try back on a whole diazepam now and one at bedtime just for a week to see to what extent your problem improves and if worsens again on the half pill after that and if so return to see Catherine [name] and if not return here with all meds in hand  Plan A = Automatic = symbicort 80 Take 2 puffs first thing in am and then another 2 puffs about 12 hours later.  Try adding prilosec otc 20mg   Take 30-60 min before first meal of the day and Pepcid  ac (famotidine) 20 mg one @  bedtime  X 4 week trial  GERD diet   Plan B = Backup Only use your albuterol as a rescue medication  Dec 2020 covid > [facility] > no change meds/ no 02    01/08/2020  f/u ov/Catherine [name] re:  GOLD II copd on symbicort 80 2bid  Chief Complaint  Patient presents with   Follow-up    Breathing is doing well today. She has been doing some walking outside. She uses her albuterol 1 x per wk on average.   Dyspnea:  Improving with minimal need for saba  Cough:none Sleeping: slt elevation / thick pillow  SABA use: rare 02: none  rec No change in respiratory medications  - if doing great ok to reduce symbicort 80 to one twice daily    07/09/2020  f/u ov/Catherine [name] re:  GOLD II copd still on symb 80 2bid  Chief Complaint  Patient presents with   Follow-up    SOB with activity  Dyspnea:  Very inactive / depressed as staying secluded   Walks 20 min s stopping s checking 02 sat ever while walking , only afterward Cough: none   Sleeping:  Fine p valium  SABA use: rarely , never pre-challenges prior to ex  02: none    No obvious day to day or daytime variability or assoc excess/ purulent sputum or mucus plugs or hemoptysis or cp or chest tightness, subjective wheeze or overt sinus or hb symptoms.    Sleeping as above without nocturnal  or early am exacerbation  of respiratory  c/o's or need for noct saba. Also denies any obvious fluctuation of symptoms with weather or environmental changes or other aggravating or alleviating factors except as outlined above   No unusual exposure hx or h/o childhood pna/ asthma or knowledge of premature birth.  Current Allergies, Complete Past Medical History, Past Surgical History, Family History, and Social History were reviewed in [employer] record.  ROS  The following are not active complaints unless bolded Hoarseness, sore throat, dysphagia, dental problems, itching, sneezing,  nasal congestion or discharge of excess mucus or purulent secretions, ear ache,   fever, chills, sweats, unintended wt loss or wt gain, classically pleuritic or exertional cp,  orthopnea pnd or arm/hand swelling  or leg swelling, presyncope, palpitations, abdominal pain, anorexia, nausea, vomiting, diarrhea  or change in bowel habits or change in bladder habits, change in stools or change in urine, dysuria, hematuria,  rash, arthralgias, visual complaints, headache, numbness, weakness or ataxia or problems with walking or coordination,  change in mood or  memory.        Current Meds  Medication Sig   acetaminophen (TYLENOL) 500 MG tablet Take 500 mg by mouth daily.    albuterol (PROVENTIL HFA;VENTOLIN HFA) 108 (90 Base) MCG/ACT inhaler Inhale 2 puffs into the lungs every 6 (six) hours as needed for wheezing or shortness of breath.   aspirin EC 81 MG EC tablet Take 1 tablet (81 mg total) by mouth daily.   budesonide-formoterol (SYMBICORT) 80-4.5 MCG/ACT inhaler Inhale 2 puffs into the lungs 2 (two) times daily.   cephALEXin (KEFLEX) 250 MG capsule TAKE (1) CAPSULE DAILY   diazepam (VALIUM) 5 MG tablet TAKE ONE TABLET AT BEDTIME   doxazosin (CARDURA) 4 MG tablet TAKE 1 TABLET DAILY. STOP LOSARTAN   fluconazole (DIFLUCAN) 150 MG tablet TAKE 1 TABLET  BY MOUTH ONCE FOR 1 DOSE   furosemide (LASIX) 40 MG tablet TAKE 1 TABLET  DAILY. MAY TAKE 1 EXTRA AS NEEDED FOR SWELLING   metoprolol succinate (TOPROL-XL) 25 MG 24 hr tablet Take 1 tablet (25 mg total) by mouth daily.   Multiple Vitamins-Minerals (HAIR/SKIN/NAILS/BIOTIN PO) Take 1 tablet by mouth daily.   OVER THE COUNTER MEDICATION Apply 1 application topically at bedtime as needed (Leg Pain). Magni-Life topical cream for leg pain.   potassium chloride (KLOR-CON) 10 MEQ tablet TAKE 6 TABLETS DAILY   rosuvastatin (CRESTOR) 5 MG tablet TAKE 1/2 TO 1 TABLET ON MONDAY AND FRIDAY AS TOLERATED   warfarin (COUMADIN) 2 MG tablet Take 1 tablet daily or take as directed by anticoagulation clinic               Objective:   Physical Exam     amb somber wf nad   07/09/2020      152  01/08/2020        145  12/20/2017        149   07/29/2016       151  03/24/15 139 lb 12.8 oz (63.413 kg)  03/20/15 141 lb (63.957 kg)  03/16/15 143 lb (64.864 kg)    Vital signs reviewed  07/09/2020  - Note at rest 02 sats  97% on RA       HEENT : pt wearing mask not removed for exam due to covid - 19 concerns.   NECK :  without JVD/Nodes/TM/ nl carotid upstrokes bilaterally   LUNGS: no acc muscle use,  Min barrel  contour chest wall with bilateral  slightly decreased bs s audible wheeze and  without cough on insp or exp maneuvers and min  Hyperresonant  to  percussion bilaterally     CV:  RRR  no s3 o 3/6 sem but no increase in P2, and no edema   ABD:  soft and nontender with pos end  insp Hoover's  in the supine position. No bruits or organomegaly appreciated, bowel sounds nl  MS:   Nl gait/  ext warm without deformities, calf tenderness, cyanosis or clubbing No obvious joint restrictions   SKIN: warm and dry without lesions    NEURO:  alert, approp, nl sensorium with  no motor or cerebellar deficits apparent.        CXR PA and Lateral:   07/09/2020 :    I personally reviewed images and    impression as follows:  Mild/mod kyphosis, no change mild non-specific increased markings     Labs ordered/ reviewed:      Chemistry      Component Value Date/Time   NA 144 07/09/2020 1403   NA 143 06/06/2019 1116   K 3.5 07/09/2020 1403   CL 103 07/09/2020 1403   CO2 32 07/09/2020 1403   BUN 12 07/09/2020 1403   BUN 11 06/06/2019 1116   CREATININE 0.71 07/09/2020 1403      Component Value Date/Time   CALCIUM 9.4 07/09/2020 1403   ALKPHOS 94 09/24/2019 1054   AST 22 07/09/2020 1403   ALT 13 07/09/2020 1403   BILITOT 1.0 07/09/2020 1403   BILITOT 0.4 06/06/2019 1116        Lab Results  Component Value Date   WBC 7.0 07/09/2020   HGB 13.3 07/09/2020   HCT 39.7 07/09/2020   MCV 90.2 07/09/2020   PLT 171 07/09/2020       EOS  259                                    07/09/2020     Lab Results  Component Value Date   TSH 3.95 07/09/2020     Lab Results  Component Value Date   PROBNP 253.0 (H) 07/09/2020       Lab Results  Component Value Date   ESRSEDRATE 3 07/09/2020          Assessment:

## 2020-07-09 NOTE — Patient Instructions (Addendum)
Be sure you have your TSH checked today due to your fatigue  (I also added BNP and ESR for evaluation of dyspnea on exertion)  - Dr Erick [facility] clinic may need to add this to the labs you will have drawn    To get the most out of exercise, you need to be continuously aware that you are short of breath, but never out of breath, for 30 minutes daily. As you improve, it will actually be easier for you to do the same amount of exercise  in  30 minutes so always push to the level where you are short of breath.    Try albuterol 15 min before an activity that you know would make you short of breath and see if it makes any difference and if makes none then don't take it after activity unless you can't catch your breath.      Please remember to go to the  x-ray department  for your tests - we will call you with the results when they are available     Please schedule a follow up office visit in 6 weeks, call sooner if needed

## 2020-07-09 NOTE — Patient Instructions (Addendum)
Pre visit review using our clinic review tool, if applicable. No additional management support is needed unless otherwise documented below in the visit note.  Change dosage and take 1 tablet daily except 1/2 tablet on Wednesdays. Re-check in 5 weeks.

## 2020-07-09 NOTE — Patient Instructions (Signed)
     If you have lab work done today you will be contacted with your lab results within the next 2 weeks.  If you have not heard from us then please contact us. The fastest way to get your results is to register for My Chart.   IF you received an x-ray today, you will receive an invoice from [facility]. Please contact [facility] at [phone] with questions or concerns regarding your invoice.   IF you received labwork today, you will receive an invoice from [facility]. Please contact [facility] at [phone] with questions or concerns regarding your invoice.   Our billing staff will not be able to assist you with questions regarding bills from these companies.  You will be contacted with the lab results as soon as they are available. The fastest way to get your results is to activate your My Chart account. Instructions are located on the last page of this paperwork. If you have not heard from us regarding the results in 2 weeks, please contact this office.        If you have lab work done today you will be contacted with your lab results within the next 2 weeks.  If you have not heard from us then please contact us. The fastest way to get your results is to register for My Chart.   IF you received an x-ray today, you will receive an invoice from [facility]. Please contact [facility] at [phone] with questions or concerns regarding your invoice.   IF you received labwork today, you will receive an invoice from [facility]. Please contact [facility] at [phone] with questions or concerns regarding your invoice.   Our billing staff will not be able to assist you with questions regarding bills from these companies.  You will be contacted with the lab results as soon as they are available. The fastest way to get your results is to activate your My Chart account. Instructions are located on the last page of this paperwork. If you have not heard from us  regarding the results in 2 weeks, please contact this office.     

## 2020-07-10 ENCOUNTER — Encounter: Payer: Self-pay | Admitting: Internal Medicine

## 2020-07-10 LAB — CBC WITH DIFFERENTIAL/PLATELET
Absolute Monocytes: 560 cells/uL (ref 200–950)
Basophils Absolute: 49 cells/uL (ref 0–200)
Basophils Relative: 0.7 %
Eosinophils Absolute: 259 cells/uL (ref 15–500)
Eosinophils Relative: 3.7 %
HCT: 39.7 % (ref 35.0–45.0)
Hemoglobin: 13.3 g/dL (ref 11.7–15.5)
Lymphs Abs: 1561 cells/uL (ref 850–3900)
MCH: 30.2 pg (ref 27.0–33.0)
MCHC: 33.5 g/dL (ref 32.0–36.0)
MCV: 90.2 fL (ref 80.0–100.0)
MPV: 10.4 fL (ref 7.5–12.5)
Monocytes Relative: 8 %
Neutro Abs: 4571 cells/uL (ref 1500–7800)
Neutrophils Relative %: 65.3 %
Platelets: 171 10*3/uL (ref 140–400)
RBC: 4.4 10*6/uL (ref 3.80–5.10)
RDW: 13.6 % (ref 11.0–15.0)
Total Lymphocyte: 22.3 %
WBC: 7 10*3/uL (ref 3.8–10.8)

## 2020-07-10 LAB — LIPID PANEL
Cholesterol: 153 mg/dL (ref <200)
HDL: 55 mg/dL (ref >=50)
LDL Cholesterol (Calc): 77 mg/dL (calc)
Non-HDL Cholesterol (Calc): 98 mg/dL (calc) (ref <130)
Total CHOL/HDL Ratio: 2.8 (calc) (ref <5.0)
Triglycerides: 126 mg/dL (ref <150)

## 2020-07-10 LAB — BASIC METABOLIC PANEL
BUN: 12 mg/dL (ref 7–25)
CO2: 32 mmol/L (ref 20–32)
Calcium: 9.4 mg/dL (ref 8.6–10.4)
Chloride: 103 mmol/L (ref 98–110)
Creat: 0.71 mg/dL (ref 0.60–0.88)
Glucose, Bld: 87 mg/dL (ref 65–99)
Potassium: 3.5 mmol/L (ref 3.5–5.3)
Sodium: 144 mmol/L (ref 135–146)

## 2020-07-10 LAB — HEPATIC FUNCTION PANEL
AG Ratio: 2 (calc) (ref 1.0–2.5)
ALT: 13 U/L (ref 6–29)
AST: 22 U/L (ref 10–35)
Albumin: 4.7 g/dL (ref 3.6–5.1)
Alkaline phosphatase (APISO): 97 U/L (ref 37–153)
Bilirubin, Direct: 0.2 mg/dL (ref 0.0–0.2)
Globulin: 2.4 g/dL (calc) (ref 1.9–3.7)
Indirect Bilirubin: 0.8 mg/dL (calc) (ref 0.2–1.2)
Total Bilirubin: 1 mg/dL (ref 0.2–1.2)
Total Protein: 7.1 g/dL (ref 6.1–8.1)

## 2020-07-14 NOTE — Progress Notes (Signed)
Attempted to call patient at both numbers listed. Unable to leave message due to no voicemail picking up on home phone and mailbox not available on cell. Will try again at another time.

## 2020-07-15 NOTE — Progress Notes (Signed)
Spoke with pt and notified of results per Dr. Wert. Pt verbalized understanding and denied any questions. 

## 2020-07-21 ENCOUNTER — Ambulatory Visit: Payer: Medicare Other | Admitting: Cardiovascular Disease

## 2020-07-21 ENCOUNTER — Encounter: Payer: Self-pay | Admitting: Cardiovascular Disease

## 2020-07-21 ENCOUNTER — Other Ambulatory Visit: Payer: Self-pay

## 2020-07-21 VITALS — BP 140/82 | HR 70 | Ht 63.0 in | Wt 151.0 lb

## 2020-07-21 DIAGNOSIS — I4891 Unspecified atrial fibrillation: Secondary | ICD-10-CM | POA: Diagnosis not present

## 2020-07-21 DIAGNOSIS — I5032 Chronic diastolic (congestive) heart failure: Secondary | ICD-10-CM | POA: Diagnosis not present

## 2020-07-21 DIAGNOSIS — E785 Hyperlipidemia, unspecified: Secondary | ICD-10-CM

## 2020-07-21 DIAGNOSIS — I38 Endocarditis, valve unspecified: Secondary | ICD-10-CM | POA: Diagnosis not present

## 2020-07-21 DIAGNOSIS — Z5181 Encounter for therapeutic drug level monitoring: Secondary | ICD-10-CM | POA: Diagnosis not present

## 2020-07-21 DIAGNOSIS — I10 Essential (primary) hypertension: Secondary | ICD-10-CM | POA: Diagnosis not present

## 2020-07-21 DIAGNOSIS — I2510 Atherosclerotic heart disease of native coronary artery without angina pectoris: Secondary | ICD-10-CM

## 2020-07-21 MED ORDER — POTASSIUM CHLORIDE ER 10 MEQ PO TBCR: EXTENDED_RELEASE_TABLET | ORAL | 3 refills | Status: DC

## 2020-07-21 MED ORDER — FUROSEMIDE 40 MG PO TABS: ORAL_TABLET | ORAL | 3 refills | Status: DC

## 2020-07-21 MED ORDER — DOXAZOSIN MESYLATE 8 MG PO TABS
8.0000 mg | ORAL_TABLET | Freq: Every day | ORAL | 3 refills | Status: DC
Start: 2020-07-21 — End: 2021-07-20

## 2020-07-21 NOTE — Progress Notes (Signed)
Cardiology Office Note   Date:  07/21/2020   ID:  Catherine [name], DOB [birth year], MRN [id number]  PCP:  [name], MD  Cardiologist:   [name], MD  Cardiothoracic surgeon: Dr. Roxy Manns  No chief complaint on file.    History of Present Illness: Catherine [name] is a 85 y.o. 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 [name] was previously a patient of [name]. She underwent mitral commissurotomy in the 1970s. On her echocardiogram 02/2014 she had an ejection fraction of 60-65% with moderate to severe mitral regurgitation. There was also mild aortic regurgitation. Her follow-up echo 02/2015 showed an EF of 60-65% with moderate to severe mitral regurgitation, moderate tricuspid regurgitation and elevated pulmonary artery pressures.  Repeat echo 9/11/7 revealed moderate pulmonary stenosis with moderate to severe mitral regurgitation.  She did not have any pulmonary hypertension and her LV was not dilated.  She was referred for left and right heart catheterization. She was found to have severe mitral stenosis with a 13 mmHg mean gradient, MVA 0.99 cm^2. She also had 50% LCx and 80% RCA lesions.  She had a 29 mm bioprosthetic mitral valve, single vessel CABG and MAZE with Dr. Roxy Manns on 10/14/16.  She was referred for a baseline echo 02/15/17 that revealed LVEF 60-65% with mild aortic stenosis (mean gradient 12 mmHg).  Her bioprosthetic mitral valve was functioning well.    After her surgery Ms. Hunsucker continued to report fatigue.  It was unclear whether she was in atrial fibrillation or sinus rhythm with a long PR interval.  She was referred to the atrial fibrillation clinic where this remained unclear.  However, it was felt that if she were in atrial fibrillation it would not be possible to maintain sinus rhythm.  She  developed lower extremity edema. She stopped amlodipine and this improved. This was switched to irbesartan. She then developed diaphoresis and insomnia that she attributed to this medication.    At her last appointment [name] was struggling to recover from [place].  She has been working to increase her exercise.  She was walking her driveway for 20 minutes.  Her PCP recommended that she increase to 30 minutes.  She struggles with her breathing and the inclines.  However she thinks that increasing her walking has helped.  She is trying to cut back on her Symbicort to cut back on the cost. She uses the albuterol before she walks.  She sometimes gets tired with exertion such as making her bed.  She has no orthopnea or PND.  She bumped her R abdomen and has soreness and a bruise.  Past Medical History:  Diagnosis Date  . Aortic stenosis, mild 07/26/2017  . Arthritis   . Asthma    last attack 02/2015  . Atrial fibrillation, chronic ([employer])   . Cellulitis of left lower extremity 08/17/2016   Ulcer associated with severe venous insufficiency  . Chronic anticoagulation   . Chronic diastolic CHF (congestive heart failure) ([place])   . Chronic kidney disease    "RIGHT MANY KIDNEY INFECTIONS AND STONES"  . COPD (chronic obstructive pulmonary disease) ([place])   . Coronary artery disease   . Dizziness   . H/O: rheumatic fever   . Heart murmur   . Hypertension   . PONV (postoperative nausea and vomiting)    ' SOMETIMES', BUT NOT  ALWAYS"  . S/P Maze operation for atrial fibrillation 10/14/2016   Complete bilateral atrial lesion set using cryothermy and bipolar radiofrequency ablation - atrial appendage was not treated due to previous surgical procedure (open mitral commissurotomy)  . S/P mitral valve replacement with bioprosthetic valve 10/14/2016   29 mm Medtronic Mosaic porcine bioprosthetic tissue valve  . UTI (urinary tract infection)   . Valvular heart disease    Has mitral stenosis with prior mitral  commissurotomy in 1970    Past Surgical History:  Procedure Laterality Date  . ABDOMINAL HYSTERECTOMY  1983   endometriosis  . APPENDECTOMY    . BACK SURGERY     neurosurgery x2  . CARDIAC CATHETERIZATION    . CARDIAC CATHETERIZATION N/A 08/03/2016   Procedure: Right/Left Heart Cath and Coronary Angiography;  Surgeon: Peter M Martinique, MD;  Location: [facility];  Service: Cardiovascular;  Laterality: N/A;  . cataract surg    . CHOLECYSTECTOMY N/A 05/30/2015   Procedure: LAPAROSCOPIC CHOLECYSTECTOMY WITH INTRAOPERATIVE CHOLANGIOGRAM;  Surgeon: Excell Seltzer, MD;  Location: [facility];  Service: General;  Laterality: N/A;  . COLONOSCOPY    . CORONARY ARTERY BYPASS GRAFT N/A 10/14/2016   Procedure: CORONARY ARTERY BYPASS GRAFTING (CABG);  Surgeon: Rexene Alberts, MD;  Location: [facility];  Service: Open Heart Surgery;  Laterality: N/A;  . EYE SURGERY    . MAZE N/A 10/14/2016   Procedure: MAZE;  Surgeon: Rexene Alberts, MD;  Location: [facility];  Service: Open Heart Surgery;  Laterality: N/A;  . MITRAL VALVE REPLACEMENT N/A 10/14/2016   Procedure: REDO MITRAL VALVE REPLACEMENT (MVR);  Surgeon: Rexene Alberts, MD;  Location: [facility];  Service: Open Heart Surgery;  Laterality: N/A;  . MITRAL VALVE SURGERY Left 1970   Open mitral commissurotomy via left thoracotomy approach  . TEE WITHOUT CARDIOVERSION N/A 07/05/2016   Procedure: TRANSESOPHAGEAL ECHOCARDIOGRAM (TEE);  Surgeon: [name], MD;  Location: [facility];  Service: Cardiovascular;  Laterality: N/A;  . TEE WITHOUT CARDIOVERSION N/A 10/14/2016   Procedure: TRANSESOPHAGEAL ECHOCARDIOGRAM (TEE);  Surgeon: Rexene Alberts, MD;  Location: [facility];  Service: Open Heart Surgery;  Laterality: N/A;    Current Outpatient Medications  Medication Sig Dispense Refill  . acetaminophen (TYLENOL) 500 MG tablet Take 500 mg by mouth daily.     Marland Kitchen albuterol (VENTOLIN HFA) 108 (90 Base) MCG/ACT inhaler Inhale 2 puffs into the lungs every 4 (four) hours  as needed for wheezing or shortness of breath. 1 each 2  . aspirin EC 81 MG EC tablet Take 1 tablet (81 mg total) by mouth daily.    . budesonide-formoterol (SYMBICORT) 80-4.5 MCG/ACT inhaler Inhale 2 puffs into the lungs 2 (two) times daily. 1 Inhaler 11  . cephALEXin (KEFLEX) 250 MG capsule TAKE (1) CAPSULE DAILY 30 capsule 3  . diazepam (VALIUM) 5 MG tablet TAKE ONE TABLET AT BEDTIME 30 tablet 5  . doxazosin (CARDURA) 8 MG tablet Take 1 tablet (8 mg total) by mouth daily. 90 tablet 3  . fluconazole (DIFLUCAN) 150 MG tablet TAKE 1 TABLET BY MOUTH ONCE FOR 1 DOSE 1 tablet 0  . furosemide (LASIX) 40 MG tablet TAKE 2 TABLETS IN THE MORNING AND 1 IN THE AFTERNOON 270 tablet 3  . metoprolol succinate (TOPROL-XL) 25 MG 24 hr tablet Take 1 tablet (25 mg total) by mouth daily. 90 tablet 1  . Multiple Vitamins-Minerals (HAIR/SKIN/NAILS/BIOTIN PO) Take 1 tablet by mouth daily.    Marland Kitchen OVER THE COUNTER MEDICATION Apply 1 application  topically at bedtime as needed (Leg Pain). Magni-Life topical cream for leg pain.    . potassium chloride (KLOR-CON) 10 MEQ tablet TAKE 8 TABLETS DAILY 720 tablet 3  . rosuvastatin (CRESTOR) 5 MG tablet TAKE 1/2 TO 1 TABLET ON MONDAY AND FRIDAY AS TOLERATED 20 tablet 5  . warfarin (COUMADIN) 2 MG tablet Take 1 tablet daily or take as directed by anticoagulation clinic 100 tablet 1   No current facility-administered medications for this visit.    Allergies:   Aldactone [spironolactone], Amoxicillin, Diltiazem, Flagyl [metronidazole hcl], Flovent [fluticasone propionate], Gabapentin, Lyrica [pregabalin], Quinidine, Simvastatin, Tramadol, Verapamil, Amlodipine, Zetia [ezetimibe], Ace inhibitors, Benazepril hcl, Ciprocin-fluocin-procin [fluocinolone acetonide], Ciprofloxacin, Codeine, and Nitrofurantoin monohyd macro    Social History:  The patient  reports that she quit smoking about 44 years ago. Her smoking use included cigarettes. She has a 15.00 pack-year smoking history. She  has never used smokeless tobacco. She reports that she does not drink alcohol and does not use drugs.   Family History:  The patient's family history includes Leukemia in her father.    ROS:  Please see the history of present illness.   Otherwise, review of systems are positive for chest wall soreness.   All other systems are reviewed and negative.    PHYSICAL EXAM: VS:  BP 140/82   Pulse 70   Ht 5\' 3"  (1.6 m)   Wt 151 lb (68.5 kg)   SpO2 98%   BMI 26.75 kg/m  , BMI Body mass index is 26.75 kg/m. GENERAL:  Well appearing HEENT: Pupils equal round and reactive, fundi not visualized, oral mucosa unremarkable NECK:  No jugular venous distention, waveform within normal limits, carotid upstroke brisk and symmetric, no bruits LUNGS:  Clear to auscultation bilaterally HEART:  Bradycardic.  Irregularly irregular.  PMI not displaced or sustained,S1 and S2 within normal limits, no S3, no S4, no clicks, no rubs, III/VI systolic murmur at the LUSB ABD:  Flat, positive bowel sounds normal in frequency in pitch, no bruits, no rebound, no guarding, no midline pulsatile mass, no hepatomegaly, no splenomegaly EXT:  2 plus pulses throughout, 1+ LLE edema, no cyanosis no clubbing SKIN: L LE erythema and chronic stasis dermatitis NEURO:  Cranial nerves II through XII grossly intact, motor grossly intact throughout PSYCH:  Cognitively intact, oriented to person place and time   EKG:  EKG is ordered today. The ekg ordered 05/21/16 demonstrates atrial fibrillateion.  Prior septal infarct.  Rate 60 bpm.  Unchanged from 10/22/15. 10/27/16: Sinus rhythm.  Rate 60 bpm.  First degree heart block. 02/01/17: Sinus bradycardia with sinus arrhythmia and first degree block.   03/08/17: Sinus bradycardia with sinus arrhythmia.   First degree heart block.  Junctional escape.  QTc 497 ms.   04/26/17: Atrial fibrillation.  Rate 69 bpm. 01/05/18: Accelerated junctional rhythm.  Rate 68 bpm.  Incomplete RBBB.  10/10/18: Sinus  rhythm.  Rate 89 bpm.  Nonspecific ST changes.  06/06/19: Atrial fibrillation.  Rate 61 bpm.  Prior septal infarct. 01/10/20: Atrial fibrillation. Rate 56 bpm.  Prior septal infarct. 07/21/20: Atrial fibrillation.  Rate 70 bpm.  Prior septal infarct.  Echo 07/06/18: Study Conclusions  - Left ventricle: The cavity size was normal. Systolic function was   vigorous. The estimated ejection fraction was in the range of 65%   to 70%. Wall motion was normal; there were no regional wall   motion abnormalities. Doppler parameters are consistent with high   ventricular filling pressure. - Aortic valve: Trileaflet; mildly  thickened, mildly calcified   leaflets. There was mild stenosis. Peak velocity (S): 266 cm/s.   Mean gradient (S): 16 mm Hg. - Mitral valve: A bioprosthesis was present and functioning   normally. - Left atrium: The atrium was severely dilated. Anterior-posterior   dimension: 60 mm. - Tricuspid valve: There was mild regurgitation.  Echo 09/2019: 1. Left ventricular ejection fraction, by visual estimation, is 65 to  70%. The left ventricle has hyperdynamic function. Left ventricular septal  wall thickness was mildly increased. There is mildly increased left  ventricular hypertrophy.  2. The left ventricle has no regional wall motion abnormalities.  3. Global right ventricle has normal systolic function.The right  ventricular size is normal. No increase in right ventricular wall  thickness.  4. Left atrial size was moderately dilated.  5. Right atrial size was moderately dilated.  6. The mitral valve has been repaired/replaced. No evidence of mitral  valve regurgitation. No evidence of mitral stenosis.  7. Patient has a 29 mm biprosthetic valve functioning normally with no  PVL The mitral struts protrude into the LVOT quite a bit likely  contributing to some of the gradient across the LVOT/AV. Creates a small  "neointimal LVOT" area.  8. The tricuspid valve is normal  in structure.  9. The aortic valve is tricuspid. Aortic valve regurgitation is not  visualized. Mild aortic valve stenosis.  10. Pulmonic regurgitation is mild.  11. The pulmonic valve was grossly normal. Pulmonic valve regurgitation is  mild.  12. Mildly elevated pulmonary artery systolic pressure.   LHC/RHC 08/03/16: The left ventricular systolic function is normal.  LV end diastolic pressure is normal.  The left ventricular ejection fraction is 55-65% by visual estimate.  There is no aortic valve stenosis.  There is severe mitral valve stenosis.  Prox Cx to Mid Cx lesion, 50 %stenosed.  Mid RCA-2 lesion, 80 %stenosed.  Mid RCA-1 lesion, 80 %stenosed.  Hemodynamic findings consistent with mild pulmonary hypertension.  LV end diastolic pressure is normal.   1. Coronary artery disease   - 50% mid LCx   - 80% sequential lesions in the mid RCA 2. Normal LV function 3. Severe mitral stenosis. MV gradient of 13 mm Hg. MVA 0.99 cm squared with index 0.57. 4. Moderate to severe mitral insufficiency 5. Mild pulmonary HTN. 6. Normal LV EDP 7. No significant AV gradient 8. Occluded right brachial artery.  Carotid Dopplers 10/12/16: 1-39% bilateral ICA stenosis  7 Day Event Monitor 06/28/17:  Quality: Fair.  Baseline artifact. Predominant rhythm: Sinus rhythm with PACs and sinus arrhythmia.    Cannot  rule out episodes of atrial fibrillation.  Recent Labs: 09/05/2019: Magnesium 2.6 07/09/2020: ALT 13; BUN 12; Creat 0.71; Hemoglobin 13.3; Platelets 171; Potassium 3.5; Pro B Natriuretic peptide (BNP) 253.0; Sodium 144; TSH 3.95    Lipid Panel    Component Value Date/Time   CHOL 153 07/09/2020 1403   CHOL 222 (H) 06/06/2019 1116   TRIG 126 07/09/2020 1403   HDL 55 07/09/2020 1403   HDL 53 06/06/2019 1116   CHOLHDL 2.8 07/09/2020 1403   VLDL 42.8 (H) 09/24/2019 1054   LDLCALC 77 07/09/2020 1403   LDLDIRECT 68.0 09/24/2019 1054      Wt Readings from Last 3  Encounters:  07/21/20 151 lb (68.5 kg)  07/09/20 151 lb 4.8 oz (68.6 kg)  07/09/20 152 lb 6.4 oz (69.1 kg)     ASSESSMENT AND PLAN: # Rheumatic mitral valve disease: # s/p bioprosthetic mitral valve: # Acute on  chronic diastolic heart failure: Ms. Halvorsen is doing well from a heart standpoint. She has no evidence of heart failure on exam.   She needs antibiotics prior to dental procedures.  Se has stasis dermatitis that is slowly improving.  She has exertional dyspnea and BNP is elevated.  We will increase lasix to 80mg  in the am and 40mg  in the afternoon.  I also suggested that she keep her fluids <2L daily.  Check BMP in a week.  # Bruising:  She has a bruise on her R lower quadrant that she thinks occurred after bumping it.  The area is tender.  We will check a CBC to make sure there is no significant blood loss as she does have what seems to be a small hematoma.  # Mild aortic stenosis: Mean gradient 16 mmHg on 06/2018.  Repeat echo in January.  This improved to 11 mmHg on her echo 09/2019.  # Persistent atrial fibrillation:  # Bradycardia: # Palpitations: Rate well-controlled.  Continue warfarin due to her valvular disease.  Check CBC.  # Hypertension:  Blood pressure is persistently above goal.  Increase doxazosin to 8 mg.  Continue metoprolol and increase furosemide as above.  # Nonobstructive coronary disease: # Hyperlipidemia:  Has not tolerated statins and muscle cramps on Zetia. She is on low dose rosuvastatin.   Current medicines are reviewed at length with the patient today.  The patient does not have concerns regarding medicines.  The following changes have been made:  none  Labs/ tests ordered today include:   Orders Placed This Encounter  Procedures  . CBC with Differential/Platelet  . Basic metabolic panel  . EKG 12-Lead    Disposition:   FU with Kimeka Badour C. Oval Linsey, MD, [facility] in 2 months.    Signed, Lynnix Schoneman C. Oval Linsey, MD, [facility]  07/21/2020 1:12 PM     [facility]

## 2020-07-21 NOTE — Patient Instructions (Addendum)
Medication Instructions:  INCREASE YOUR FUROSEMIDE TO 80 MG IN THE MORNING AND 40 MG IN THE AFTERNOON   INCREASE YOUR POTASSIUM TO 80 MEQ  DAILY   INCREASE YOUR DOXAZOSIN TO 8 MG DAILY   *If you need a refill on your cardiac medications before your next appointment, please call your pharmacy*   Lab Work: BMET/CBC 1 WEEK   If you have labs (blood work) drawn today and your tests are completely normal, you will receive your results only by: Marland Kitchen MyChart Message (if you have MyChart) OR . A paper copy in the mail If you have any lab test that is abnormal or we need to change your treatment, we will call you to review the results.  Testing/Procedures: NONE  Follow-Up: At [facility], you and your health needs are our priority.  As part of our continuing mission to provide you with exceptional heart care, we have created designated Provider Care Teams.  These Care Teams include your primary Cardiologist (physician) and Advanced Practice Providers (APPs -  Physician Assistants and Nurse Practitioners) who all work together to provide you with the care you need, when you need it.  We recommend signing up for the patient portal called "MyChart".  Sign up information is provided on this After Visit Summary.  MyChart is used to connect with patients for Virtual Visits (Telemedicine).  Patients are able to view lab/test results, encounter notes, upcoming appointments, etc.  Non-urgent messages can be sent to your provider as well.   To learn more about what you can do with MyChart, go to [email or web address].    Your next appointment:   2-3  month(s)  The format for your next appointment:   In Person  Provider:   You may see [name], MD or one of the following Advanced Practice Providers on your designated Care Team:    Kerin Ransom, PA-C  [place], [place]  Coletta Memos, [place]   MONITOR AND LOG YOUR BLOOD PRESSURE, BRING READINGS TO FOLLOW UP

## 2020-07-22 ENCOUNTER — Telehealth: Payer: Self-pay | Admitting: Family Medicine

## 2020-07-22 ENCOUNTER — Telehealth: Payer: Self-pay

## 2020-07-22 DIAGNOSIS — Z79899 Other long term (current) drug therapy: Secondary | ICD-10-CM

## 2020-07-22 NOTE — Progress Notes (Signed)
Chronic Care Management Pharmacy Assistant   Name: Catherine [name]  MRN: [id number] DOB: May 16, 1935  Reason for Encounter: Medication Review/General Adherence Call.  PCP : [name], MD  Allergies:   Allergies  Allergen Reactions  . Aldactone [Spironolactone] Other (See Comments)    dyspnea  . Amoxicillin Palpitations    Tachycardia Has patient had a PCN reaction causing immediate rash, facial/tongue/throat swelling, SOB or lightheadedness with hypotension: no Has patient had a PCN reaction causing severe rash involving mucus membranes or skin necrosis: {no Has patient had a PCN reaction that required hospitalization {no Has patient had a PCN reaction occurring within the last 10 years: {yes If all of the above answers are "NO", then may proceed with Cephalosporin use.  . Diltiazem Other (See Comments)    Causing headaches   . Flagyl [Metronidazole Hcl] Other (See Comments)    Causing headaches    . Flovent [Fluticasone Propionate] Other (See Comments)    Leg cramps  . Gabapentin Swelling  . Lyrica [Pregabalin] Swelling  . Quinidine Diarrhea and Other (See Comments)    Fever diarrhea  . Simvastatin Other (See Comments)    Leg pain, myalgia  . Tramadol Nausea Only  . Verapamil Other (See Comments)    myalgias  . Amlodipine     Low extremity edema  . Zetia [Ezetimibe]     LEG CRAMPS   . Ace Inhibitors Other (See Comments)    unknown  . Benazepril Hcl Cough  . Ciprocin-Fluocin-Procin [Fluocinolone Acetonide] Other (See Comments)    unknown  . Ciprofloxacin Diarrhea  . Codeine Nausea Only  . Nitrofurantoin Monohyd Macro Nausea Only    Medications: Outpatient Encounter Medications as of 07/22/2020  Medication Sig  . acetaminophen (TYLENOL) 500 MG tablet Take 500 mg by mouth daily.   Marland Kitchen albuterol (VENTOLIN HFA) 108 (90 Base) MCG/ACT inhaler Inhale 2 puffs into the lungs every 4 (four) hours as needed for wheezing or shortness of breath.  Marland Kitchen aspirin EC  81 MG EC tablet Take 1 tablet (81 mg total) by mouth daily.  . budesonide-formoterol (SYMBICORT) 80-4.5 MCG/ACT inhaler Inhale 2 puffs into the lungs 2 (two) times daily.  . cephALEXin (KEFLEX) 250 MG capsule TAKE (1) CAPSULE DAILY  . diazepam (VALIUM) 5 MG tablet TAKE ONE TABLET AT BEDTIME  . doxazosin (CARDURA) 8 MG tablet Take 1 tablet (8 mg total) by mouth daily.  . fluconazole (DIFLUCAN) 150 MG tablet TAKE 1 TABLET BY MOUTH ONCE FOR 1 DOSE  . furosemide (LASIX) 40 MG tablet TAKE 2 TABLETS IN THE MORNING AND 1 IN THE AFTERNOON  . metoprolol succinate (TOPROL-XL) 25 MG 24 hr tablet Take 1 tablet (25 mg total) by mouth daily.  . Multiple Vitamins-Minerals (HAIR/SKIN/NAILS/BIOTIN PO) Take 1 tablet by mouth daily.  Marland Kitchen OVER THE COUNTER MEDICATION Apply 1 application topically at bedtime as needed (Leg Pain). Magni-Life topical cream for leg pain.  . potassium chloride (KLOR-CON) 10 MEQ tablet TAKE 8 TABLETS DAILY  . rosuvastatin (CRESTOR) 5 MG tablet TAKE 1/2 TO 1 TABLET ON MONDAY AND FRIDAY AS TOLERATED  . warfarin (COUMADIN) 2 MG tablet Take 1 tablet daily or take as directed by anticoagulation clinic   No facility-administered encounter medications on file as of 07/22/2020.    Current Diagnosis: Patient Active Problem List   Diagnosis Date Noted  . Protein-calorie malnutrition, severe 09/05/2019  . Dehydration   . Gastroenteritis   . Enteritis 09/03/2019  . COVID-19 08/27/2019  . Dysuria 07/07/2019  .  Chronic anxiety 12/23/2017  . Aortic stenosis, mild 07/26/2017  . Dyslipidemia 12/20/2016  . S/P mitral valve replacement with bioprosthetic valve + CABG x1 + maze procedure 10/14/2016  . S/P Maze operation for atrial fibrillation 10/14/2016  . S/P CABG x 1 10/14/2016  . Cellulitis of left lower extremity 08/17/2016  . Chronic diastolic CHF (congestive heart failure) ([place])   . Coronary artery disease   . Chronic insomnia 07/12/2016  . DOE (dyspnea on exertion) 03/26/2015  .  Warfarin-induced coagulopathy ([place]) 03/16/2015  . Valvular heart disease 03/16/2015  . COPD GOLD II  03/16/2015  . Nephrolithiasis 03/16/2015  . Essential hypertension   . RHEUMATIC MITRAL STENOSIS 01/05/2008  . Rheumatic heart disease 01/05/2008  . Atrial fibrillation ([place]) 01/05/2008    Follow-Up:  Pharmacist Review   Called patient and discussed medication adherence  with patient, no issues at this time with current medication.   Patient denies ED visit since her last CPP follow up.  Patient denies any side effects with her medication. Patient denies any problems with her current pharmacy  Patient states she had a visit with her cardiologist on 07/21/20 and changes was made to her medications.  Increase furosemide to 80 MG in the morning and 40 MG in the afternoon.  Increase potassium to 80 MEQ Daily.  Increase Doxazosin to 8 MG Daily.  Patient states she has not apply for Extra Help program because husband worry they make to much to be approve. Patient states she will speak with her husband and apply for the Extra help program.  [place] Pharmacist Assistant [phone]

## 2020-07-22 NOTE — Telephone Encounter (Signed)
Pt is calling in wanting to get labs done from her Cardiologist that has changed her medication that the pt is currently on.  Pt stated that she was told by Dr. Elease Hashimoto that she can have them done here she is aware that we only do [facility] labs.  Pt stated that the Cardiologist has sent the labs over.  May we have orders for the labs so that pt can be called to be scheduled?

## 2020-07-26 NOTE — Telephone Encounter (Signed)
I placed future order for labs and she needs to schedule.

## 2020-07-28 ENCOUNTER — Telehealth: Payer: Self-pay | Admitting: *Deleted

## 2020-07-28 NOTE — Telephone Encounter (Signed)
Spoke with the pts husband and informed him to have the pt call for a lab appt as below.

## 2020-07-28 NOTE — Telephone Encounter (Signed)
Patient has an appointment for tomorrow with the lab and patient will bring blood pressure readings.

## 2020-07-29 ENCOUNTER — Telehealth: Payer: Self-pay

## 2020-07-29 ENCOUNTER — Other Ambulatory Visit: Payer: Self-pay

## 2020-07-29 ENCOUNTER — Other Ambulatory Visit: Payer: Medicare Other

## 2020-07-29 DIAGNOSIS — Z79899 Other long term (current) drug therapy: Secondary | ICD-10-CM

## 2020-07-29 LAB — CBC WITH DIFFERENTIAL/PLATELET
Absolute Monocytes: 540 cells/uL (ref 200–950)
Basophils Absolute: 38 cells/uL (ref 0–200)
Basophils Relative: 0.7 %
Eosinophils Absolute: 151 cells/uL (ref 15–500)
Eosinophils Relative: 2.8 %
HCT: 39.7 % (ref 35.0–45.0)
Hemoglobin: 12.8 g/dL (ref 11.7–15.5)
Lymphs Abs: 1377 cells/uL (ref 850–3900)
MCH: 30 pg (ref 27.0–33.0)
MCHC: 32.2 g/dL (ref 32.0–36.0)
MCV: 93.2 fL (ref 80.0–100.0)
MPV: 10.3 fL (ref 7.5–12.5)
Monocytes Relative: 10 %
Neutro Abs: 3294 cells/uL (ref 1500–7800)
Neutrophils Relative %: 61 %
Platelets: 185 10*3/uL (ref 140–400)
RBC: 4.26 10*6/uL (ref 3.80–5.10)
RDW: 13.3 % (ref 11.0–15.0)
Total Lymphocyte: 25.5 %
WBC: 5.4 10*3/uL (ref 3.8–10.8)

## 2020-07-29 NOTE — Progress Notes (Signed)
Chronic Care Management Pharmacy Assistant   Name: Catherine [name]  MRN: [id number] DOB: 06-18-1935  Reason for Encounter: Medication Review/Patient assistance for Albuterol and Symbicort.  PCP : [name], MD  Allergies:   Allergies  Allergen Reactions  . Aldactone [Spironolactone] Other (See Comments)    dyspnea  . Amoxicillin Palpitations    Tachycardia Has patient had a PCN reaction causing immediate rash, facial/tongue/throat swelling, SOB or lightheadedness with hypotension: no Has patient had a PCN reaction causing severe rash involving mucus membranes or skin necrosis: {no Has patient had a PCN reaction that required hospitalization {no Has patient had a PCN reaction occurring within the last 10 years: {yes If all of the above answers are "NO", then may proceed with Cephalosporin use.  . Diltiazem Other (See Comments)    Causing headaches   . Flagyl [Metronidazole Hcl] Other (See Comments)    Causing headaches    . Flovent [Fluticasone Propionate] Other (See Comments)    Leg cramps  . Gabapentin Swelling  . Lyrica [Pregabalin] Swelling  . Quinidine Diarrhea and Other (See Comments)    Fever diarrhea  . Simvastatin Other (See Comments)    Leg pain, myalgia  . Tramadol Nausea Only  . Verapamil Other (See Comments)    myalgias  . Amlodipine     Low extremity edema  . Zetia [Ezetimibe]     LEG CRAMPS   . Ace Inhibitors Other (See Comments)    unknown  . Benazepril Hcl Cough  . Ciprocin-Fluocin-Procin [Fluocinolone Acetonide] Other (See Comments)    unknown  . Ciprofloxacin Diarrhea  . Codeine Nausea Only  . Nitrofurantoin Monohyd Macro Nausea Only    Medications: Outpatient Encounter Medications as of 07/29/2020  Medication Sig  . acetaminophen (TYLENOL) 500 MG tablet Take 500 mg by mouth daily.   Marland Kitchen albuterol (VENTOLIN HFA) 108 (90 Base) MCG/ACT inhaler Inhale 2 puffs into the lungs every 4 (four) hours as needed for wheezing or shortness of  breath.  Marland Kitchen aspirin EC 81 MG EC tablet Take 1 tablet (81 mg total) by mouth daily.  . budesonide-formoterol (SYMBICORT) 80-4.5 MCG/ACT inhaler Inhale 2 puffs into the lungs 2 (two) times daily.  . cephALEXin (KEFLEX) 250 MG capsule TAKE (1) CAPSULE DAILY  . diazepam (VALIUM) 5 MG tablet TAKE ONE TABLET AT BEDTIME  . doxazosin (CARDURA) 8 MG tablet Take 1 tablet (8 mg total) by mouth daily.  . fluconazole (DIFLUCAN) 150 MG tablet TAKE 1 TABLET BY MOUTH ONCE FOR 1 DOSE  . furosemide (LASIX) 40 MG tablet TAKE 2 TABLETS IN THE MORNING AND 1 IN THE AFTERNOON  . metoprolol succinate (TOPROL-XL) 25 MG 24 hr tablet Take 1 tablet (25 mg total) by mouth daily.  . Multiple Vitamins-Minerals (HAIR/SKIN/NAILS/BIOTIN PO) Take 1 tablet by mouth daily.  Marland Kitchen OVER THE COUNTER MEDICATION Apply 1 application topically at bedtime as needed (Leg Pain). Magni-Life topical cream for leg pain.  . potassium chloride (KLOR-CON) 10 MEQ tablet TAKE 8 TABLETS DAILY  . rosuvastatin (CRESTOR) 5 MG tablet TAKE 1/2 TO 1 TABLET ON MONDAY AND FRIDAY AS TOLERATED  . warfarin (COUMADIN) 2 MG tablet Take 1 tablet daily or take as directed by anticoagulation clinic   No facility-administered encounter medications on file as of 07/29/2020.    Current Diagnosis: Patient Active Problem List   Diagnosis Date Noted  . Protein-calorie malnutrition, severe 09/05/2019  . Dehydration   . Gastroenteritis   . Enteritis 09/03/2019  . COVID-19 08/27/2019  .  Dysuria 07/07/2019  . Chronic anxiety 12/23/2017  . Aortic stenosis, mild 07/26/2017  . Dyslipidemia 12/20/2016  . S/P mitral valve replacement with bioprosthetic valve + CABG x1 + maze procedure 10/14/2016  . S/P Maze operation for atrial fibrillation 10/14/2016  . S/P CABG x 1 10/14/2016  . Cellulitis of left lower extremity 08/17/2016  . Chronic diastolic CHF (congestive heart failure) ([place])   . Coronary artery disease   . Chronic insomnia 07/12/2016  . DOE (dyspnea on exertion)  03/26/2015  . Warfarin-induced coagulopathy ([place]) 03/16/2015  . Valvular heart disease 03/16/2015  . COPD GOLD II  03/16/2015  . Nephrolithiasis 03/16/2015  . Essential hypertension   . RHEUMATIC MITRAL STENOSIS 01/05/2008  . Rheumatic heart disease 01/05/2008  . Atrial fibrillation ([place]) 01/05/2008    Follow-Up:  Patient Assistance Coordination   Spoke to patient to inform her that we are sending her a Patient assistance form  for albuterol and Symbicort by mail.Informed patient to include a copy of her proof of income AND a copy of her Explanation of Benefits (EOB) statement from her insurance.Advised patient to return the PAP forms back to the [place] office.  Patient verbalized understanding.  [facility] Pharmacist Assistant [phone]

## 2020-07-30 LAB — BASIC METABOLIC PANEL
BUN/Creatinine Ratio: 18 (calc) (ref 6–22)
BUN: 17 mg/dL (ref 7–25)
CO2: 32 mmol/L (ref 20–32)
Calcium: 9.6 mg/dL (ref 8.6–10.4)
Chloride: 104 mmol/L (ref 98–110)
Creat: 0.94 mg/dL — ABNORMAL HIGH (ref 0.60–0.88)
Glucose, Bld: 98 mg/dL (ref 65–99)
Potassium: 3.8 mmol/L (ref 3.5–5.3)
Sodium: 143 mmol/L (ref 135–146)

## 2020-07-31 ENCOUNTER — Telehealth: Payer: Self-pay | Admitting: *Deleted

## 2020-07-31 NOTE — Telephone Encounter (Signed)
-----   Message from [name], MD sent at 07/31/2020 10:55 AM EDT ----- Labs and BP log reviewed.  BP looks good and labs are stable.  I'm very happy that she is feeling better.  Thanks so Dr. Elease Hashimoto for getting her lab work for [name].  ~Tiffany Oval Linsey

## 2020-07-31 NOTE — Telephone Encounter (Signed)
Advised patient, verbalized understanding  

## 2020-08-06 ENCOUNTER — Ambulatory Visit (INDEPENDENT_AMBULATORY_CARE_PROVIDER_SITE_OTHER): Payer: Medicare Other | Admitting: Family Medicine

## 2020-08-06 ENCOUNTER — Encounter: Payer: Self-pay | Admitting: Family Medicine

## 2020-08-06 ENCOUNTER — Other Ambulatory Visit: Payer: Self-pay

## 2020-08-06 VITALS — BP 128/82 | HR 60 | Temp 98.6°F | Ht 63.0 in

## 2020-08-06 DIAGNOSIS — R21 Rash and other nonspecific skin eruption: Secondary | ICD-10-CM

## 2020-08-06 DIAGNOSIS — I10 Essential (primary) hypertension: Secondary | ICD-10-CM | POA: Diagnosis not present

## 2020-08-06 DIAGNOSIS — S301XXA Contusion of abdominal wall, initial encounter: Secondary | ICD-10-CM | POA: Diagnosis not present

## 2020-08-06 MED ORDER — TRIAMCINOLONE ACETONIDE 0.1 % EX CREA: 1.0000 "application " | TOPICAL_CREAM | Freq: Two times a day (BID) | CUTANEOUS | 1 refills | Status: DC

## 2020-08-06 NOTE — Progress Notes (Signed)
Established Patient Office Visit  Subjective:  Patient ID: Catherine [name], female    DOB: 07-19-1935  Age: 85 y.o. MRN: [id number]  CC:  Chief Complaint  Patient presents with  . knot on rt side    knot on rt side , discolored, 3 week ago, painful, feel  hard , having high b/p reading , rash , b/p     HPI Catherine [name] presents for the following issues  She states she recently noticed a "knot "on her right lower abdomen. She states that she frequently bumps into things. She does not recall specific injury. She had some bruising and that is slowly receding but she has had a firm area palpation about size of a marble near the center of the bruising. Nontender. She is on Coumadin.  Recent elevated blood pressure readings. She saw cardiology and apparently her Cardura was increased to 8 mg. Her Lasix was increased and her peripheral edema has improved.  She noticed some rash on her lower extremities just yesterday involving her legs and thighs. Minimally pruritic. No recent changes soaps or detergents. No new medications. No recent fever. No sore throat symptoms. No truncal rash. No upper extremity rash.  Past Medical History:  Diagnosis Date  . Aortic stenosis, mild 07/26/2017  . Arthritis   . Asthma    last attack 02/2015  . Atrial fibrillation, chronic ([place])   . Cellulitis of left lower extremity 08/17/2016   Ulcer associated with severe venous insufficiency  . Chronic anticoagulation   . Chronic diastolic CHF (congestive heart failure) ([place])   . Chronic kidney disease    "RIGHT MANY KIDNEY INFECTIONS AND STONES"  . COPD (chronic obstructive pulmonary disease) ([place])   . Coronary artery disease   . Dizziness   . H/O: rheumatic fever   . Heart murmur   . Hypertension   . PONV (postoperative nausea and vomiting)    ' SOMETIMES', BUT NOT ALWAYS"  . S/P Maze operation for atrial fibrillation 10/14/2016   Complete bilateral atrial lesion set using cryothermy and bipolar  radiofrequency ablation - atrial appendage was not treated due to previous surgical procedure (open mitral commissurotomy)  . S/P mitral valve replacement with bioprosthetic valve 10/14/2016   29 mm Medtronic Mosaic porcine bioprosthetic tissue valve  . UTI (urinary tract infection)   . Valvular heart disease    Has mitral stenosis with prior mitral commissurotomy in 1970    Past Surgical History:  Procedure Laterality Date  . ABDOMINAL HYSTERECTOMY  1983   endometriosis  . APPENDECTOMY    . BACK SURGERY     neurosurgery x2  . CARDIAC CATHETERIZATION    . CARDIAC CATHETERIZATION N/A 08/03/2016   Procedure: Right/Left Heart Cath and Coronary Angiography;  Surgeon: Peter M Martinique, MD;  Location: [facility];  Service: Cardiovascular;  Laterality: N/A;  . cataract surg    . CHOLECYSTECTOMY N/A 05/30/2015   Procedure: LAPAROSCOPIC CHOLECYSTECTOMY WITH INTRAOPERATIVE CHOLANGIOGRAM;  Surgeon: Excell Seltzer, MD;  Location: [facility];  Service: General;  Laterality: N/A;  . COLONOSCOPY    . CORONARY ARTERY BYPASS GRAFT N/A 10/14/2016   Procedure: CORONARY ARTERY BYPASS GRAFTING (CABG);  Surgeon: Rexene Alberts, MD;  Location: [facility];  Service: Open Heart Surgery;  Laterality: N/A;  . EYE SURGERY    . MAZE N/A 10/14/2016   Procedure: MAZE;  Surgeon: Rexene Alberts, MD;  Location: [facility];  Service: Open Heart Surgery;  Laterality: N/A;  . MITRAL VALVE REPLACEMENT N/A  10/14/2016   Procedure: REDO MITRAL VALVE REPLACEMENT (MVR);  Surgeon: Rexene Alberts, MD;  Location: [facility];  Service: Open Heart Surgery;  Laterality: N/A;  . MITRAL VALVE SURGERY Left 1970   Open mitral commissurotomy via left thoracotomy approach  . TEE WITHOUT CARDIOVERSION N/A 07/05/2016   Procedure: TRANSESOPHAGEAL ECHOCARDIOGRAM (TEE);  Surgeon: [name], MD;  Location: [facility];  Service: Cardiovascular;  Laterality: N/A;  . TEE WITHOUT CARDIOVERSION N/A 10/14/2016   Procedure: TRANSESOPHAGEAL ECHOCARDIOGRAM  (TEE);  Surgeon: Rexene Alberts, MD;  Location: [facility];  Service: Open Heart Surgery;  Laterality: N/A;    Family History  Problem Relation Age of Onset  . Leukemia Father   . Breast cancer Neg Hx     Social History   Socioeconomic History  . Marital status: Married    Spouse name: Not on file  . Number of children: Not on file  . Years of education: Not on file  . Highest education level: Not on file  Occupational History  . Not on file  Tobacco Use  . Smoking status: Former Smoker    Packs/day: 1.00    Years: 15.00    Pack years: 15.00    Types: Cigarettes    Quit date: 09/28/1975    Years since quitting: 44.8  . Smokeless tobacco: Never Used  Vaping Use  . Vaping Use: Never used  Substance and Sexual Activity  . Alcohol use: No    Alcohol/week: 0.0 standard drinks  . Drug use: No  . Sexual activity: Yes  Other Topics Concern  . Not on file  Social History Narrative  . Not on file   Social Determinants of Health   Financial Resource Strain: High Risk  . Difficulty of Paying Living Expenses: Hard  Food Insecurity:   . Worried About Charity fundraiser in the Last Year: Not on file  . Ran Out of Food in the Last Year: Not on file  Transportation Needs: No Transportation Needs  . Lack of Transportation (Medical): No  . Lack of Transportation (Non-Medical): No  Physical Activity:   . Days of Exercise per Week: Not on file  . Minutes of Exercise per Session: Not on file  Stress:   . Feeling of Stress : Not on file  Social Connections:   . Frequency of Communication with Friends and Family: Not on file  . Frequency of Social Gatherings with Friends and Family: Not on file  . Attends Religious Services: Not on file  . Active Member of Clubs or Organizations: Not on file  . Attends Archivist Meetings: Not on file  . Marital Status: Not on file  Intimate Partner Violence:   . Fear of Current or Ex-Partner: Not on file  . Emotionally Abused: Not on  file  . Physically Abused: Not on file  . Sexually Abused: Not on file    Outpatient Medications Prior to Visit  Medication Sig Dispense Refill  . acetaminophen (TYLENOL) 500 MG tablet Take 500 mg by mouth daily.     Marland Kitchen albuterol (VENTOLIN HFA) 108 (90 Base) MCG/ACT inhaler Inhale 2 puffs into the lungs every 4 (four) hours as needed for wheezing or shortness of breath. 1 each 2  . aspirin EC 81 MG EC tablet Take 1 tablet (81 mg total) by mouth daily.    . budesonide-formoterol (SYMBICORT) 80-4.5 MCG/ACT inhaler Inhale 2 puffs into the lungs 2 (two) times daily. 1 Inhaler 11  . cephALEXin (KEFLEX) 250 MG capsule  TAKE (1) CAPSULE DAILY 30 capsule 3  . diazepam (VALIUM) 5 MG tablet TAKE ONE TABLET AT BEDTIME 30 tablet 5  . doxazosin (CARDURA) 8 MG tablet Take 1 tablet (8 mg total) by mouth daily. 90 tablet 3  . furosemide (LASIX) 40 MG tablet TAKE 2 TABLETS IN THE MORNING AND 1 IN THE AFTERNOON 270 tablet 3  . metoprolol succinate (TOPROL-XL) 25 MG 24 hr tablet Take 1 tablet (25 mg total) by mouth daily. 90 tablet 1  . Multiple Vitamins-Minerals (HAIR/SKIN/NAILS/BIOTIN PO) Take 1 tablet by mouth daily.    . potassium chloride (KLOR-CON) 10 MEQ tablet TAKE 8 TABLETS DAILY 720 tablet 3  . rosuvastatin (CRESTOR) 5 MG tablet TAKE 1/2 TO 1 TABLET ON MONDAY AND FRIDAY AS TOLERATED 20 tablet 5  . warfarin (COUMADIN) 2 MG tablet Take 1 tablet daily or take as directed by anticoagulation clinic 100 tablet 1  . fluconazole (DIFLUCAN) 150 MG tablet TAKE 1 TABLET BY MOUTH ONCE FOR 1 DOSE 1 tablet 0  . OVER THE COUNTER MEDICATION Apply 1 application topically at bedtime as needed (Leg Pain). Magni-Life topical cream for leg pain.     No facility-administered medications prior to visit.    Allergies  Allergen Reactions  . Aldactone [Spironolactone] Other (See Comments)    dyspnea  . Amoxicillin Palpitations    Tachycardia Has patient had a PCN reaction causing immediate rash, facial/tongue/throat  swelling, SOB or lightheadedness with hypotension: no Has patient had a PCN reaction causing severe rash involving mucus membranes or skin necrosis: {no Has patient had a PCN reaction that required hospitalization {no Has patient had a PCN reaction occurring within the last 10 years: {yes If all of the above answers are "NO", then may proceed with Cephalosporin use.  . Diltiazem Other (See Comments)    Causing headaches   . Flagyl [Metronidazole Hcl] Other (See Comments)    Causing headaches    . Flovent [Fluticasone Propionate] Other (See Comments)    Leg cramps  . Gabapentin Swelling  . Lyrica [Pregabalin] Swelling  . Quinidine Diarrhea and Other (See Comments)    Fever diarrhea  . Simvastatin Other (See Comments)    Leg pain, myalgia  . Tramadol Nausea Only  . Verapamil Other (See Comments)    myalgias  . Amlodipine     Low extremity edema  . Zetia [Ezetimibe]     LEG CRAMPS   . Ace Inhibitors Other (See Comments)    unknown  . Benazepril Hcl Cough  . Ciprocin-Fluocin-Procin [Fluocinolone Acetonide] Other (See Comments)    unknown  . Ciprofloxacin Diarrhea  . Codeine Nausea Only  . Nitrofurantoin Monohyd Macro Nausea Only    ROS Review of Systems  Constitutional: Negative for chills and fever.  HENT: Negative for sore throat.   Respiratory: Negative for cough and shortness of breath.   Cardiovascular: Negative for chest pain.  Gastrointestinal: Negative for abdominal pain.  Skin: Positive for rash.      Objective:    Physical Exam Vitals reviewed.  Constitutional:      Appearance: Normal appearance.  Cardiovascular:     Rate and Rhythm: Normal rate.  Pulmonary:     Effort: Pulmonary effort is normal.     Breath sounds: Normal breath sounds.  Abdominal:     Palpations: Abdomen is soft.     Comments: Only a very small approximately 1 cm area of faint ecchymosis right lower abdomen. Near the center that she has approximately 1/2 cm area of mild  induration  consistent with likely small hematoma. Nontender.  Skin:    Findings: Rash present.     Comments: She has nonspecific rash lower extremities involving legs and thighs which is macular erythematous with slightly scaly surface. No similar involvement of upper extremities or trunk other than lower back region. No pustules.  Neurological:     Mental Status: She is alert.     BP 128/82 (BP Location: Left Arm, Patient Position: Sitting, Cuff Size: Normal)   Pulse 60   Temp 98.6 F (37 C) (Oral)   Ht 5\' 3"  (1.6 m)   SpO2 96%   BMI 26.75 kg/m  Wt Readings from Last 3 Encounters:  07/21/20 151 lb (68.5 kg)  07/09/20 151 lb 4.8 oz (68.6 kg)  07/09/20 152 lb 6.4 oz (69.1 kg)     There are no preventive care reminders to display for this patient.  There are no preventive care reminders to display for this patient.  Lab Results  Component Value Date   TSH 3.95 07/09/2020   Lab Results  Component Value Date   WBC 5.4 07/29/2020   HGB 12.8 07/29/2020   HCT 39.7 07/29/2020   MCV 93.2 07/29/2020   PLT 185 07/29/2020   Lab Results  Component Value Date   NA 143 07/29/2020   K 3.8 07/29/2020   CO2 32 07/29/2020   GLUCOSE 98 07/29/2020   BUN 17 07/29/2020   CREATININE 0.94 (H) 07/29/2020   BILITOT 1.0 07/09/2020   ALKPHOS 94 09/24/2019   AST 22 07/09/2020   ALT 13 07/09/2020   PROT 7.1 07/09/2020   ALBUMIN 3.9 09/24/2019   CALCIUM 9.6 07/29/2020   ANIONGAP 11 09/06/2019   GFR 67.66 10/01/2019   Lab Results  Component Value Date   CHOL 153 07/09/2020   Lab Results  Component Value Date   HDL 55 07/09/2020   Lab Results  Component Value Date   LDLCALC 77 07/09/2020   Lab Results  Component Value Date   TRIG 126 07/09/2020   Lab Results  Component Value Date   CHOLHDL 2.8 07/09/2020   Lab Results  Component Value Date   HGBA1C 5.3 10/12/2016      Assessment & Plan:   #1 recent small hematoma right lower abdomen. This is in a patient on Coumadin. Appears  to be resolving. -Reassurance and continue close follow-up with Coumadin clinic  #2 hypertension. Recent elevation but improved at this time. Continue current blood pressure medication regimen  #3 nonspecific scaly rash lower extremities. Etiology unclear. -Recommend trial of triamcinolone 0.1% cream twice daily as needed and may combine moisturizer with this. -Follow-up for any progressive rash or other concerns  Meds ordered this encounter  Medications  . triamcinolone cream (KENALOG) 0.1 %    Sig: Apply 1 application topically 2 (two) times daily.    Dispense:  30 g    Refill:  1    Follow-up: No follow-ups on file.    [name], MD

## 2020-08-06 NOTE — Patient Instructions (Signed)
Hematoma A hematoma is a collection of blood under the skin, in an organ, in a body space, in a joint space, or in other tissue. The blood can thicken (clot) to form a lump that you can see and feel. The lump is often firm and may become sore and tender. Most hematomas get better in a few days to weeks. However, some hematomas may be serious and require medical care. Hematomas can range from very small to very large. What are the causes? This condition is caused by:  A blunt or penetrating injury.  A leakage from a blood vessel under the skin.  Some medical procedures, including surgeries, such as oral surgery, face lifts, and surgeries on the joints.  Some medical conditions that cause bleeding or bruising. There may be multiple hematomas that appear in different areas of the body. What increases the risk? You are more likely to develop this condition if:  You are an older adult.  You use blood thinners. What are the signs or symptoms?  Symptoms of this condition depend on where the hematoma is located.  Common symptoms of a hematoma that is under the skin include:  A firm lump on the body.  Pain and tenderness in the area.  Bruising. Blue, dark blue, purple-red, or yellowish skin (discoloration) may appear at the site of the hematoma if the hematoma is close to the surface of the skin. Common symptoms of a hematoma that is deep in the tissues or body spaces may be less obvious. They include:  A collection of blood in the stomach (intra-abdominal hematoma). This may cause pain in the abdomen, weakness, fainting, and shortness of breath.  A collection of blood in the head (intracranial hematoma). This may cause a headache or symptoms such as weakness, trouble speaking or understanding, or a change in consciousness. How is this diagnosed? This condition is diagnosed based on:  Your medical history.  A physical exam.  Imaging tests, such as an ultrasound or CT scan. These may  be needed if your health care provider suspects a hematoma in deeper tissues or body spaces.  Blood tests. These may be needed if your health care provider believes that the hematoma is caused by a medical condition. How is this treated? Treatment for this condition depends on the cause, size, and location of the hematoma. Treatment may include:  Doing nothing. The majority of hematomas do not need treatment as many of them go away on their own over time.  Surgery or close monitoring. This may be needed for large hematomas or hematomas that affect vital organs.  Medicines. Medicines may be given if there is an underlying medical cause for the hematoma. Follow these instructions at home: Managing pain, stiffness, and swelling   If directed, put ice on the affected area. ? Put ice in a plastic bag. ? Place a towel between your skin and the bag. ? Leave the ice on for 20 minutes, 2-3 times a day for the first couple of days.  If directed, apply heat to the affected area after applying ice for a couple of days. Use the heat source that your health care provider recommends, such as a moist heat pack or a heating pad. ? Place a towel between your skin and the heat source. ? Leave the heat on for 20-30 minutes. ? Remove the heat if your skin turns bright red. This is especially important if you are unable to feel pain, heat, or cold. You may have a greater   risk of getting burned.  Raise (elevate) the affected area above the level of your heart while you are sitting or lying down.  If told, wrap the affected area with an elastic bandage. The bandage applies pressure (compression) to the area, which may help to reduce swelling and promote healing. Do not wrap the bandage too tightly around the affected area.  If your hematoma is on a leg or foot (lower extremity) and is painful, your health care provider may recommend crutches. Use them as told by your health care provider. General  instructions  Take over-the-counter and prescription medicines only as told by your health care provider.  Keep all follow-up visits as told by your health care provider. This is important. Contact a health care provider if:  You have a fever.  The swelling or discoloration gets worse.  You develop more hematomas. Get help right away if:  Your pain is worse or your pain is not controlled with medicine.  Your skin over the hematoma breaks or starts bleeding.  Your hematoma is in your chest or abdomen and you have weakness, shortness of breath, or a change in consciousness.  You have a hematoma on your scalp that is caused by a fall or injury, and you also have: ? A headache that gets worse. ? Trouble speaking or understanding speech. ? Weakness. ? Change in alertness or consciousness. Summary  A hematoma is a collection of blood under the skin, in an organ, in a body space, in a joint space, or in other tissue.  This condition usually does not need treatment because many hematomas go away on their own over time.  Large hematomas, or those that may affect vital organs, may need surgical drainage or monitoring. If the hematoma is caused by a medical condition, medicines may be prescribed.  Get help right away if your hematoma breaks or starts to bleed, you have shortness of breath, or you have a headache or trouble speaking after a fall. This information is not intended to replace advice given to you by your health care provider. Make sure you discuss any questions you have with your health care provider. Document Revised: 02/07/2019 Document Reviewed: 02/16/2018 Elsevier Patient Education  2020 [employer]  

## 2020-08-11 ENCOUNTER — Ambulatory Visit (INDEPENDENT_AMBULATORY_CARE_PROVIDER_SITE_OTHER): Payer: Medicare Other | Admitting: General Practice

## 2020-08-11 ENCOUNTER — Other Ambulatory Visit: Payer: Self-pay

## 2020-08-11 ENCOUNTER — Ambulatory Visit: Payer: Medicare Other

## 2020-08-11 DIAGNOSIS — I4891 Unspecified atrial fibrillation: Secondary | ICD-10-CM

## 2020-08-11 DIAGNOSIS — D3132 Benign neoplasm of left choroid: Secondary | ICD-10-CM | POA: Diagnosis not present

## 2020-08-11 DIAGNOSIS — H35071 Retinal telangiectasis, right eye: Secondary | ICD-10-CM | POA: Diagnosis not present

## 2020-08-11 DIAGNOSIS — D492 Neoplasm of unspecified behavior of bone, soft tissue, and skin: Secondary | ICD-10-CM | POA: Diagnosis not present

## 2020-08-11 DIAGNOSIS — H26492 Other secondary cataract, left eye: Secondary | ICD-10-CM | POA: Diagnosis not present

## 2020-08-11 DIAGNOSIS — Z961 Presence of intraocular lens: Secondary | ICD-10-CM | POA: Diagnosis not present

## 2020-08-11 LAB — POCT INR: INR: 2.7 (ref 2.0–3.0)

## 2020-08-11 NOTE — Patient Instructions (Addendum)
Pre visit review using our clinic review tool, if applicable. No additional management support is needed unless otherwise documented below in the visit note.  Continue to take 1 tablet daily  except 1/2 tablet on Wednesdays.  Re-check in 5 weeks. 

## 2020-09-09 ENCOUNTER — Encounter: Payer: Self-pay | Admitting: Family Medicine

## 2020-09-09 ENCOUNTER — Other Ambulatory Visit: Payer: Self-pay

## 2020-09-09 ENCOUNTER — Ambulatory Visit (INDEPENDENT_AMBULATORY_CARE_PROVIDER_SITE_OTHER): Payer: Medicare Other | Admitting: Family Medicine

## 2020-09-09 VITALS — BP 142/78 | HR 55 | Temp 98.3°F | Ht 62.5 in | Wt 152.0 lb

## 2020-09-09 DIAGNOSIS — Z0000 Encounter for general adult medical examination without abnormal findings: Secondary | ICD-10-CM | POA: Diagnosis not present

## 2020-09-09 DIAGNOSIS — R8290 Unspecified abnormal findings in urine: Secondary | ICD-10-CM

## 2020-09-09 LAB — POC URINALSYSI DIPSTICK (AUTOMATED)
Bilirubin, UA: NEGATIVE
Blood, UA: NEGATIVE
Glucose, UA: NEGATIVE
Ketones, UA: NEGATIVE
Leukocytes, UA: NEGATIVE
Nitrite, UA: NEGATIVE
Protein, UA: NEGATIVE
Spec Grav, UA: 1.02 (ref 1.010–1.025)
Urobilinogen, UA: 0.2 E.U./dL
pH, UA: 6 (ref 5.0–8.0)

## 2020-09-09 NOTE — Patient Instructions (Signed)
Try Cerumenex or Debrox for left ear and leave in at least 30 minutes followed by gentle irrigation with rubber bulb syringe and luke warm water  Set up 30 minute follow up for skin lesion excision.

## 2020-09-09 NOTE — Progress Notes (Signed)
Established Patient Office Visit  Subjective:  Patient ID: Catherine [name], female    DOB: 1935/05/05  Age: 85 y.o. MRN: [id number]  CC:  Chief Complaint  Patient presents with  . Annual Exam    HPI [name] presents for physical exam.  Chronic problems include history of atrial fibrillation, CAD, chronic diastolic heart failure, hypertension, COPD, history of kidney stones, chronic Coumadin therapy, chronic insomnia, chronic anxiety.  She remains on Coumadin and followed through Coumadin clinic.  She has had a few falls over recent years but none recently.  She feels relatively stable at this time overall.  She has history of chronic peripheral edema issues but currently stable on her regimen of Lasix.  Her weight has been stable.  No recent chest pains  Health maintenance reviewed  -Flu vaccine already given -Covid vaccines complete -Pneumonia vaccines complete -Shingles vaccine completed -Tetanus due 2023 -She had mammogram 1 year ago. -He has had previous hysterectomy -Last colonoscopy 2019 and she is aged out of screening colonoscopies  -Social history-she is married.  Quit smoking back in 1977.  No alcohol.  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  44 year old brother currently being treated for melanoma    Past Medical History:  Diagnosis Date  . Aortic stenosis, mild 07/26/2017  . Arthritis   . Asthma    last attack 02/2015  . Atrial fibrillation, chronic ([place])   . Cellulitis of left lower extremity 08/17/2016   Ulcer associated with severe venous insufficiency  . Chronic anticoagulation   . Chronic diastolic CHF (congestive heart failure) ([place])   . Chronic kidney disease    "RIGHT MANY KIDNEY INFECTIONS AND STONES"  . COPD (chronic obstructive pulmonary disease) ([place])   . Coronary artery disease   . Dizziness   . H/O: rheumatic fever   . Heart murmur   . Hypertension   . PONV (postoperative nausea  and vomiting)    ' SOMETIMES', BUT NOT ALWAYS"  . S/P Maze operation for atrial fibrillation 10/14/2016   Complete bilateral atrial lesion set using cryothermy and bipolar radiofrequency ablation - atrial appendage was not treated due to previous surgical procedure (open mitral commissurotomy)  . S/P mitral valve replacement with bioprosthetic valve 10/14/2016   29 mm Medtronic Mosaic porcine bioprosthetic tissue valve  . UTI (urinary tract infection)   . Valvular heart disease    Has mitral stenosis with prior mitral commissurotomy in 1970    Past Surgical History:  Procedure Laterality Date  . ABDOMINAL HYSTERECTOMY  1983   endometriosis  . APPENDECTOMY    . BACK SURGERY     neurosurgery x2  . CARDIAC CATHETERIZATION    . CARDIAC CATHETERIZATION N/A 08/03/2016   Procedure: Right/Left Heart Cath and Coronary Angiography;  Surgeon: Peter M Martinique, MD;  Location: [facility];  Service: Cardiovascular;  Laterality: N/A;  . cataract surg    . CHOLECYSTECTOMY N/A 05/30/2015   Procedure: LAPAROSCOPIC CHOLECYSTECTOMY WITH INTRAOPERATIVE CHOLANGIOGRAM;  Surgeon: Excell Seltzer, MD;  Location: [facility];  Service: General;  Laterality: N/A;  . COLONOSCOPY    . CORONARY ARTERY BYPASS GRAFT N/A 10/14/2016   Procedure: CORONARY ARTERY BYPASS GRAFTING (CABG);  Surgeon: Rexene Alberts, MD;  Location: [facility];  Service: Open Heart Surgery;  Laterality: N/A;  . EYE SURGERY    . MAZE N/A 10/14/2016   Procedure: MAZE;  Surgeon: Rexene Alberts, MD;  Location: [facility];  Service: Open Heart Surgery;  Laterality: N/A;  . MITRAL VALVE REPLACEMENT N/A 10/14/2016   Procedure: REDO MITRAL VALVE REPLACEMENT (MVR);  Surgeon: Rexene Alberts, MD;  Location: [facility];  Service: Open Heart Surgery;  Laterality: N/A;  . MITRAL VALVE SURGERY Left 1970   Open mitral commissurotomy via left thoracotomy approach  . TEE WITHOUT CARDIOVERSION N/A 07/05/2016   Procedure: TRANSESOPHAGEAL ECHOCARDIOGRAM (TEE);  Surgeon: [name], MD;  Location: [facility];  Service: Cardiovascular;  Laterality: N/A;  . TEE WITHOUT CARDIOVERSION N/A 10/14/2016   Procedure: TRANSESOPHAGEAL ECHOCARDIOGRAM (TEE);  Surgeon: Rexene Alberts, MD;  Location: [facility];  Service: Open Heart Surgery;  Laterality: N/A;    Family History  Problem Relation Age of Onset  . Leukemia Father   . Breast cancer Neg Hx     Social History   Socioeconomic History  . Marital status: Married    Spouse name: Not on file  . Number of children: Not on file  . Years of education: Not on file  . Highest education level: Not on file  Occupational History  . Not on file  Tobacco Use  . Smoking status: Former Smoker    Packs/day: 1.00    Years: 15.00    Pack years: 15.00    Types: Cigarettes    Quit date: 09/28/1975    Years since quitting: 44.9  . Smokeless tobacco: Never Used  Vaping Use  . Vaping Use: Never used  Substance and Sexual Activity  . Alcohol use: No    Alcohol/week: 0.0 standard drinks  . Drug use: No  . Sexual activity: Yes  Other Topics Concern  . Not on file  Social History Narrative  . Not on file   Social Determinants of Health   Financial Resource Strain: High Risk  . Difficulty of Paying Living Expenses: Hard  Food Insecurity: Not on file  Transportation Needs: No Transportation Needs  . Lack of Transportation (Medical): No  . Lack of Transportation (Non-Medical): No  Physical Activity: Not on file  Stress: Not on file  Social Connections: Not on file  Intimate Partner Violence: Not on file    Outpatient Medications Prior to Visit  Medication Sig Dispense Refill  . acetaminophen (TYLENOL) 500 MG tablet Take 500 mg by mouth daily.    Marland Kitchen albuterol (VENTOLIN HFA) 108 (90 Base) MCG/ACT inhaler Inhale 2 puffs into the lungs every 4 (four) hours as needed for wheezing or shortness of breath. 1 each 2  . aspirin EC 81 MG EC tablet Take 1 tablet (81 mg total) by mouth daily.    . budesonide-formoterol  (SYMBICORT) 80-4.5 MCG/ACT inhaler Inhale 2 puffs into the lungs 2 (two) times daily. (Patient taking differently: Inhale 1 puff into the lungs 2 (two) times daily.) 1 Inhaler 11  . cephALEXin (KEFLEX) 250 MG capsule TAKE (1) CAPSULE DAILY 30 capsule 3  . diazepam (VALIUM) 5 MG tablet TAKE ONE TABLET AT BEDTIME 30 tablet 5  . doxazosin (CARDURA) 8 MG tablet Take 1 tablet (8 mg total) by mouth daily. 90 tablet 3  . furosemide (LASIX) 40 MG tablet TAKE 2 TABLETS IN THE MORNING AND 1 IN THE AFTERNOON 270 tablet 3  . metoprolol succinate (TOPROL-XL) 25 MG 24 hr tablet Take 1 tablet (25 mg total) by mouth daily. 90 tablet 1  . Multiple Vitamins-Minerals (HAIR/SKIN/NAILS/BIOTIN PO) Take 1 tablet by mouth daily.    . potassium chloride (KLOR-CON) 10 MEQ tablet TAKE 8 TABLETS DAILY 720 tablet 3  . rosuvastatin (CRESTOR) 5 MG  tablet TAKE 1/2 TO 1 TABLET ON MONDAY AND FRIDAY AS TOLERATED 20 tablet 5  . warfarin (COUMADIN) 2 MG tablet Take 1 tablet daily or take as directed by anticoagulation clinic 100 tablet 1  . triamcinolone cream (KENALOG) 0.1 % Apply 1 application topically 2 (two) times daily. 30 g 1   No facility-administered medications prior to visit.    Allergies  Allergen Reactions  . Aldactone [Spironolactone] Other (See Comments)    dyspnea  . Amoxicillin Palpitations    Tachycardia Has patient had a PCN reaction causing immediate rash, facial/tongue/throat swelling, SOB or lightheadedness with hypotension: no Has patient had a PCN reaction causing severe rash involving mucus membranes or skin necrosis: {no Has patient had a PCN reaction that required hospitalization {no Has patient had a PCN reaction occurring within the last 10 years: {yes If all of the above answers are "NO", then may proceed with Cephalosporin use.  . Diltiazem Other (See Comments)    Causing headaches   . Flagyl [Metronidazole Hcl] Other (See Comments)    Causing headaches    . Flovent [Fluticasone  Propionate] Other (See Comments)    Leg cramps  . Gabapentin Swelling  . Lyrica [Pregabalin] Swelling  . Quinidine Diarrhea and Other (See Comments)    Fever diarrhea  . Simvastatin Other (See Comments)    Leg pain, myalgia  . Tramadol Nausea Only  . Verapamil Other (See Comments)    myalgias  . Amlodipine     Low extremity edema  . Zetia [Ezetimibe]     LEG CRAMPS   . Ace Inhibitors Other (See Comments)    unknown  . Benazepril Hcl Cough  . Ciprocin-Fluocin-Procin [Fluocinolone Acetonide] Other (See Comments)    unknown  . Ciprofloxacin Diarrhea  . Codeine Nausea Only  . Nitrofurantoin Monohyd Macro Nausea Only    ROS Review of Systems  Constitutional: Negative for activity change, appetite change, fever and unexpected weight change.  HENT: Negative for ear pain, hearing loss, sore throat and trouble swallowing.   Eyes: Negative for visual disturbance.  Respiratory: Negative for cough and shortness of breath.   Cardiovascular: Negative for chest pain and palpitations.  Gastrointestinal: Negative for abdominal pain, blood in stool, constipation and diarrhea.  Genitourinary: Negative for dysuria and hematuria.  Musculoskeletal: Positive for arthralgias. Negative for back pain and myalgias.  Skin: Negative for rash.  Neurological: Negative for dizziness, syncope and headaches.  Hematological: Negative for adenopathy.  Psychiatric/Behavioral: Negative for confusion and dysphoric mood.      Objective:    Physical Exam Constitutional:      Appearance: She is well-developed and well-nourished.  HENT:     Head: Normocephalic and atraumatic.     Ears:     Comments: She has cerumen impaction left canal Eyes:     Extraocular Movements: EOM normal.     Pupils: Pupils are equal, round, and reactive to light.  Neck:     Thyroid: No thyromegaly.  Cardiovascular:     Rate and Rhythm: Normal rate and regular rhythm.     Heart sounds: Normal heart sounds. No murmur  heard.   Pulmonary:     Effort: No respiratory distress.     Breath sounds: Normal breath sounds. No wheezing or rales.  Abdominal:     General: Bowel sounds are normal. There is no distension.     Palpations: Abdomen is soft. There is no mass.     Tenderness: There is no abdominal tenderness. There is no guarding or  rebound.  Musculoskeletal:        General: No edema. Normal range of motion.     Cervical back: Normal range of motion and neck supple.  Lymphadenopathy:     Cervical: No cervical adenopathy.  Skin:    Findings: No rash.     Comments: She has skin lesion right shoulder which has slightly nodular erythematous base and hyperkeratotic surface  Neurological:     Mental Status: She is alert and oriented to person, place, and time.     Cranial Nerves: No cranial nerve deficit.     Deep Tendon Reflexes: Reflexes normal.  Psychiatric:        Mood and Affect: Mood and affect normal.        Behavior: Behavior normal.        Thought Content: Thought content normal.        Judgment: Judgment normal.     BP (!) 142/78 (BP Location: Left Arm, Cuff Size: Normal)   Pulse (!) 55   Temp 98.3 F (36.8 C) (Oral)   Ht 5' 2.5" (1.588 m)   Wt 152 lb (68.9 kg)   BMI 27.36 kg/m  Wt Readings from Last 3 Encounters:  09/09/20 152 lb (68.9 kg)  07/21/20 151 lb (68.5 kg)  07/09/20 151 lb 4.8 oz (68.6 kg)     There are no preventive care reminders to display for this patient.  There are no preventive care reminders to display for this patient.  Lab Results  Component Value Date   TSH 3.95 07/09/2020   Lab Results  Component Value Date   WBC 5.4 07/29/2020   HGB 12.8 07/29/2020   HCT 39.7 07/29/2020   MCV 93.2 07/29/2020   PLT 185 07/29/2020   Lab Results  Component Value Date   NA 143 07/29/2020   K 3.8 07/29/2020   CO2 32 07/29/2020   GLUCOSE 98 07/29/2020   BUN 17 07/29/2020   CREATININE 0.94 (H) 07/29/2020   BILITOT 1.0 07/09/2020   ALKPHOS 94 09/24/2019   AST  22 07/09/2020   ALT 13 07/09/2020   PROT 7.1 07/09/2020   ALBUMIN 3.9 09/24/2019   CALCIUM 9.6 07/29/2020   ANIONGAP 11 09/06/2019   GFR 67.66 10/01/2019   Lab Results  Component Value Date   CHOL 153 07/09/2020   Lab Results  Component Value Date   HDL 55 07/09/2020   Lab Results  Component Value Date   LDLCALC 77 07/09/2020   Lab Results  Component Value Date   TRIG 126 07/09/2020   Lab Results  Component Value Date   CHOLHDL 2.8 07/09/2020   Lab Results  Component Value Date   HGBA1C 5.3 10/12/2016      Assessment & Plan:   Physical exam.  She has multiple chronic issues as above.  We discussed several issues today as follows  -She complained of increased odor from her urine.  Her urine dipstick is completely normal.  Reassurance given.  Increase hydration  -Vaccines up-to-date  -She is not due for any follow-up labs at this time  -Suggested she try over-the-counter Debrox or Cerumenex to her left ear canal followed by gentle irrigation with lukewarm water  She has dermatologist.  We have recommend she try to get in to see her dermatologist next couple months.  Concern for possible squamous cell skin cancer right shoulder.  This should be biopsied  -Continue with annual flu vaccine  -She will continue with at least every other year mammogram  No  orders of the defined types were placed in this encounter.   Follow-up: No follow-ups on file.    [name], MD

## 2020-09-10 DIAGNOSIS — L570 Actinic keratosis: Secondary | ICD-10-CM | POA: Diagnosis not present

## 2020-09-10 DIAGNOSIS — C44622 Squamous cell carcinoma of skin of right upper limb, including shoulder: Secondary | ICD-10-CM | POA: Diagnosis not present

## 2020-09-12 ENCOUNTER — Encounter: Payer: Self-pay | Admitting: Family Medicine

## 2020-09-12 ENCOUNTER — Telehealth: Payer: Self-pay | Admitting: Family Medicine

## 2020-09-12 ENCOUNTER — Telehealth (INDEPENDENT_AMBULATORY_CARE_PROVIDER_SITE_OTHER): Payer: Medicare Other | Admitting: Family Medicine

## 2020-09-12 ENCOUNTER — Other Ambulatory Visit: Payer: Self-pay | Admitting: Family Medicine

## 2020-09-12 ENCOUNTER — Other Ambulatory Visit: Payer: Self-pay

## 2020-09-12 DIAGNOSIS — J441 Chronic obstructive pulmonary disease with (acute) exacerbation: Secondary | ICD-10-CM

## 2020-09-12 MED ORDER — DOXYCYCLINE HYCLATE 100 MG PO TABS: 100.0000 mg | ORAL_TABLET | Freq: Two times a day (BID) | ORAL | 0 refills | Status: AC

## 2020-09-12 NOTE — Telephone Encounter (Signed)
Pt is calling in stating that when she takes a antibiotic the provider calls in fluconazole (DIFLUCAN) b/c she gets yeast infections with the antibiotics.   Pharm:  [facility]

## 2020-09-12 NOTE — Progress Notes (Signed)
Virtual Visit via Telephone Note  I connected with Catherine [name] on 09/12/20 at  1:00 PM EST by telephone and verified that I am speaking with the correct person using two identifiers.   I discussed the limitations, risks, security and privacy concerns of performing an evaluation and management service by telephone and the availability of in person appointments. I also discussed with the patient that there may be a patient responsible charge related to this service. The patient expressed understanding and agreed to proceed.  Location patient: home Location provider: work or home office Participants present for the call: patient, provider Patient did not have a visit in the prior 7 days to address this/these issue(s).   History of Present Illness: Pt is an 85 yo female with pmh sig for COPD, HLD, HTN, anxiety, insomnia, CAD, Afib, AS, diastolic CHF, former smoker, s/p mitral valve replacement, CABG x 1, and maze procedure followed by Dr. Elease Hashimoto seen for acute concern.  Pt with hacking productive cough with brown sputum x several days.  Initially thought symptoms 2/2 aspiration of a chocolate covered cherry at time of CPE on 09/09/20.  Symptoms have continued. Also has sore throat.  Pt denies fever.  Pt gargling with salt water, tessalon, and tylenol.  Using symbicort and proair inhaler.   Observations/Objective: Patient sounds cheerful and well on the phone. I do not appreciate any SOB. Speech and thought processing are grossly intact. Patient reported vitals:  Assessment and Plan: COPD exacerbation ([place])  -pt is a former smoker -continue symbicort and albuterol inhlaer prn -pt wishes to wait on prednisone 2/2 it making her feel nervous. -numerous allergies reviewed. -will start doxycycline.  Tolerated in the past. -given precautions - Plan: doxycycline (VIBRA-TABS) 100 MG tablet    Follow Up Instructions: prn  [id number] [id number] [id number] 11-20 [id number] 21-30 I did not refer this patient  for an OV in the next 24 hours for this/these issue(s).  I discussed the assessment and treatment plan with the patient. The patient was provided an opportunity to ask questions and all were answered. The patient agreed with the plan and demonstrated an understanding of the instructions.   The patient was advised to call back or seek an in-person evaluation if the symptoms worsen or if the condition fails to improve as anticipated.  I provided 7 minutes of non-face-to-face time during this encounter.   Billie Ruddy, MD

## 2020-09-13 NOTE — Telephone Encounter (Signed)
rx sent

## 2020-09-15 ENCOUNTER — Other Ambulatory Visit: Payer: Self-pay

## 2020-09-15 ENCOUNTER — Ambulatory Visit (INDEPENDENT_AMBULATORY_CARE_PROVIDER_SITE_OTHER): Payer: Medicare Other | Admitting: General Practice

## 2020-09-15 ENCOUNTER — Other Ambulatory Visit: Payer: Self-pay | Admitting: Family Medicine

## 2020-09-15 DIAGNOSIS — I4891 Unspecified atrial fibrillation: Secondary | ICD-10-CM | POA: Diagnosis not present

## 2020-09-15 LAB — POCT INR: INR: 3 (ref 2.0–3.0)

## 2020-09-15 NOTE — Patient Instructions (Signed)
Pre visit review using our clinic review tool, if applicable. No additional management support is needed unless otherwise documented below in the visit note.  Skip a dosage today and then continue to take 1 tablet daily except 1/2 tablet on Wednesdays. Re-check in 4 weeks.

## 2020-09-15 NOTE — Progress Notes (Signed)
Rx for diflucan sent in by pt's pcp.

## 2020-09-15 NOTE — Telephone Encounter (Signed)
Rx sent in by pt's pcp.

## 2020-10-13 ENCOUNTER — Ambulatory Visit: Payer: Medicare Other | Admitting: Family Medicine

## 2020-10-13 ENCOUNTER — Ambulatory Visit: Payer: Medicare Other

## 2020-10-15 ENCOUNTER — Encounter: Payer: Self-pay | Admitting: Family Medicine

## 2020-10-15 ENCOUNTER — Other Ambulatory Visit: Payer: Self-pay

## 2020-10-15 ENCOUNTER — Ambulatory Visit (INDEPENDENT_AMBULATORY_CARE_PROVIDER_SITE_OTHER): Payer: Medicare Other | Admitting: Family Medicine

## 2020-10-15 ENCOUNTER — Ambulatory Visit: Payer: Medicare Other

## 2020-10-15 VITALS — BP 132/78 | HR 54 | Ht 62.5 in | Wt 156.0 lb

## 2020-10-15 DIAGNOSIS — I4891 Unspecified atrial fibrillation: Secondary | ICD-10-CM | POA: Diagnosis not present

## 2020-10-15 DIAGNOSIS — H6122 Impacted cerumen, left ear: Secondary | ICD-10-CM | POA: Diagnosis not present

## 2020-10-15 LAB — POCT INR: INR: 3.6 — AB (ref 2.0–3.0)

## 2020-10-15 NOTE — Progress Notes (Signed)
Established Patient Office Visit  Subjective:  Patient ID: Catherine [name], female    DOB: [birth year]  Age: 86 y.o. MRN: [id number]  CC:  Chief Complaint  Patient presents with  . Ear Fullness    HPI Catherine [name] presents for fullness left ear.  Few months ago she had problems with impaction and we attempted removal.  She had very dry wax in the left ear and had a membrane of wax adherent to the sidewall and when this was pulled away she had some bleeding.  She is on anticoagulation.  She has had no hearing loss but sometimes has sensation of water trapped in her left ear.  She has occasional vertigo symptoms.  Right ear is asymptomatic.  She is on Coumadin and is due for follow-up pro time.  Past Medical History:  Diagnosis Date  . Aortic stenosis, mild 07/26/2017  . Arthritis   . Asthma    last attack 02/2015  . Atrial fibrillation, chronic ([place])   . Cellulitis of left lower extremity 08/17/2016   Ulcer associated with severe venous insufficiency  . Chronic anticoagulation   . Chronic diastolic CHF (congestive heart failure) ([place])   . Chronic kidney disease    "RIGHT MANY KIDNEY INFECTIONS AND STONES"  . COPD (chronic obstructive pulmonary disease) ([place])   . Coronary artery disease   . Dizziness   . H/O: rheumatic fever   . Heart murmur   . Hypertension   . PONV (postoperative nausea and vomiting)    ' SOMETIMES', BUT NOT ALWAYS"  . S/P Maze operation for atrial fibrillation 10/14/2016   Complete bilateral atrial lesion set using cryothermy and bipolar radiofrequency ablation - atrial appendage was not treated due to previous surgical procedure (open mitral commissurotomy)  . S/P mitral valve replacement with bioprosthetic valve 10/14/2016   29 mm Medtronic Mosaic porcine bioprosthetic tissue valve  . UTI (urinary tract infection)   . Valvular heart disease    Has mitral stenosis with prior mitral commissurotomy in 1970    Past Surgical History:  Procedure Laterality  Date  . ABDOMINAL HYSTERECTOMY  1983   endometriosis  . APPENDECTOMY    . BACK SURGERY     neurosurgery x2  . CARDIAC CATHETERIZATION    . CARDIAC CATHETERIZATION N/A 08/03/2016   Procedure: Right/Left Heart Cath and Coronary Angiography;  Surgeon: Peter M Martinique, MD;  Location: [facility];  Service: Cardiovascular;  Laterality: N/A;  . cataract surg    . CHOLECYSTECTOMY N/A 05/30/2015   Procedure: LAPAROSCOPIC CHOLECYSTECTOMY WITH INTRAOPERATIVE CHOLANGIOGRAM;  Surgeon: Excell Seltzer, MD;  Location: [place];  Service: General;  Laterality: N/A;  . COLONOSCOPY    . CORONARY ARTERY BYPASS GRAFT N/A 10/14/2016   Procedure: CORONARY ARTERY BYPASS GRAFTING (CABG);  Surgeon: Rexene Alberts, MD;  Location: [place];  Service: Open Heart Surgery;  Laterality: N/A;  . EYE SURGERY    . MAZE N/A 10/14/2016   Procedure: MAZE;  Surgeon: Rexene Alberts, MD;  Location: [place];  Service: Open Heart Surgery;  Laterality: N/A;  . MITRAL VALVE REPLACEMENT N/A 10/14/2016   Procedure: REDO MITRAL VALVE REPLACEMENT (MVR);  Surgeon: Rexene Alberts, MD;  Location: [place];  Service: Open Heart Surgery;  Laterality: N/A;  . MITRAL VALVE SURGERY Left 1970   Open mitral commissurotomy via left thoracotomy approach  . TEE WITHOUT CARDIOVERSION N/A 07/05/2016   Procedure: TRANSESOPHAGEAL ECHOCARDIOGRAM (TEE);  Surgeon: [name], MD;  Location: [place];  Service: Cardiovascular;  Laterality: N/A;  . TEE WITHOUT CARDIOVERSION N/A 10/14/2016   Procedure: TRANSESOPHAGEAL ECHOCARDIOGRAM (TEE);  Surgeon: Rexene Alberts, MD;  Location: [place];  Service: Open Heart Surgery;  Laterality: N/A;    Family History  Problem Relation Age of Onset  . Leukemia Father   . Breast cancer Neg Hx     Social History   Socioeconomic History  . Marital status: Married    Spouse name: Not on file  . Number of children: Not on file  . Years of education: Not on file  . Highest education level: Not on file   Occupational History  . Not on file  Tobacco Use  . Smoking status: Former Smoker    Packs/day: 1.00    Years: 15.00    Pack years: 15.00    Types: Cigarettes    Quit date: 09/28/1975    Years since quitting: 45.0  . Smokeless tobacco: Never Used  Vaping Use  . Vaping Use: Never used  Substance and Sexual Activity  . Alcohol use: No    Alcohol/week: 0.0 standard drinks  . Drug use: No  . Sexual activity: Yes  Other Topics Concern  . Not on file  Social History Narrative  . Not on file   Social Determinants of Health   Financial Resource Strain: High Risk  . Difficulty of Paying Living Expenses: Hard  Food Insecurity: Not on file  Transportation Needs: No Transportation Needs  . Lack of Transportation (Medical): No  . Lack of Transportation (Non-Medical): No  Physical Activity: Not on file  Stress: Not on file  Social Connections: Not on file  Intimate Partner Violence: Not on file    Outpatient Medications Prior to Visit  Medication Sig Dispense Refill  . acetaminophen (TYLENOL) 500 MG tablet Take 500 mg by mouth daily.    Marland Kitchen albuterol (VENTOLIN HFA) 108 (90 Base) MCG/ACT inhaler Inhale 2 puffs into the lungs every 4 (four) hours as needed for wheezing or shortness of breath. 1 each 2  . aspirin EC 81 MG EC tablet Take 1 tablet (81 mg total) by mouth daily.    . budesonide-formoterol (SYMBICORT) 80-4.5 MCG/ACT inhaler Inhale 2 puffs into the lungs 2 (two) times daily. (Patient taking differently: Inhale 1 puff into the lungs 2 (two) times daily.) 1 Inhaler 11  . cephALEXin (KEFLEX) 250 MG capsule TAKE (1) CAPSULE DAILY 30 capsule 3  . diazepam (VALIUM) 5 MG tablet TAKE ONE TABLET AT BEDTIME 30 tablet 5  . doxazosin (CARDURA) 8 MG tablet Take 1 tablet (8 mg total) by mouth daily. 90 tablet 3  . fluconazole (DIFLUCAN) 150 MG tablet TAKE 1 TABLET FOR 1 DOSE 1 tablet 0  . furosemide (LASIX) 40 MG tablet TAKE 2 TABLETS IN THE MORNING AND 1 IN THE AFTERNOON 270 tablet 3  .  metoprolol succinate (TOPROL-XL) 25 MG 24 hr tablet Take 1 tablet (25 mg total) by mouth daily. 90 tablet 1  . Multiple Vitamins-Minerals (HAIR/SKIN/NAILS/BIOTIN PO) Take 1 tablet by mouth daily.    . potassium chloride (KLOR-CON) 10 MEQ tablet TAKE 8 TABLETS DAILY 720 tablet 3  . rosuvastatin (CRESTOR) 5 MG tablet TAKE 1/2 TO 1 TABLET ON MONDAY AND FRIDAY AS TOLERATED 20 tablet 5  . warfarin (COUMADIN) 2 MG tablet Take 1 tablet daily or take as directed by anticoagulation clinic 100 tablet 1   No facility-administered medications prior to visit.    Allergies  Allergen Reactions  . Aldactone [Spironolactone] Other (See Comments)  dyspnea  . Amoxicillin Palpitations    Tachycardia Has patient had a PCN reaction causing immediate rash, facial/tongue/throat swelling, SOB or lightheadedness with hypotension: no Has patient had a PCN reaction causing severe rash involving mucus membranes or skin necrosis: {no Has patient had a PCN reaction that required hospitalization {no Has patient had a PCN reaction occurring within the last 10 years: {yes If all of the above answers are "NO", then may proceed with Cephalosporin use.  . Diltiazem Other (See Comments)    Causing headaches   . Flagyl [Metronidazole Hcl] Other (See Comments)    Causing headaches    . Flovent [Fluticasone Propionate] Other (See Comments)    Leg cramps  . Gabapentin Swelling  . Lyrica [Pregabalin] Swelling  . Quinidine Diarrhea and Other (See Comments)    Fever diarrhea  . Simvastatin Other (See Comments)    Leg pain, myalgia  . Tramadol Nausea Only  . Verapamil Other (See Comments)    myalgias  . Amlodipine     Low extremity edema  . Zetia [Ezetimibe]     LEG CRAMPS   . Ace Inhibitors Other (See Comments)    unknown  . Benazepril Hcl Cough  . Ciprocin-Fluocin-Procin [Fluocinolone Acetonide] Other (See Comments)    unknown  . Ciprofloxacin Diarrhea  . Codeine Nausea Only  . Nitrofurantoin Monohyd Macro  Nausea Only    ROS Review of Systems  HENT: Negative for ear discharge, ear pain and hearing loss.   Respiratory: Negative for shortness of breath.   Cardiovascular: Negative for chest pain.      Objective:    Physical Exam Vitals reviewed.  Constitutional:      Appearance: Normal appearance.  HENT:     Ears:     Comments: Only very minimal cerumen right canal.  She has cerumen impaction left canal. Cardiovascular:     Rate and Rhythm: Normal rate.  Pulmonary:     Effort: Pulmonary effort is normal.     Breath sounds: Normal breath sounds.  Neurological:     Mental Status: She is alert.     BP 132/78   Pulse (!) 54   Ht 5' 2.5" (1.588 m)   Wt 156 lb (70.8 kg)   SpO2 94%   BMI 28.08 kg/m  Wt Readings from Last 3 Encounters:  10/15/20 156 lb (70.8 kg)  09/09/20 152 lb (68.9 kg)  07/21/20 151 lb (68.5 kg)     There are no preventive care reminders to display for this patient.  There are no preventive care reminders to display for this patient.  Lab Results  Component Value Date   TSH 3.95 07/09/2020   Lab Results  Component Value Date   WBC 5.4 07/29/2020   HGB 12.8 07/29/2020   HCT 39.7 07/29/2020   MCV 93.2 07/29/2020   PLT 185 07/29/2020   Lab Results  Component Value Date   NA 143 07/29/2020   K 3.8 07/29/2020   CO2 32 07/29/2020   GLUCOSE 98 07/29/2020   BUN 17 07/29/2020   CREATININE 0.94 (H) 07/29/2020   BILITOT 1.0 07/09/2020   ALKPHOS 94 09/24/2019   AST 22 07/09/2020   ALT 13 07/09/2020   PROT 7.1 07/09/2020   ALBUMIN 3.9 09/24/2019   CALCIUM 9.6 07/29/2020   ANIONGAP 11 09/06/2019   GFR 67.66 10/01/2019   Lab Results  Component Value Date   CHOL 153 07/09/2020   Lab Results  Component Value Date   HDL 55 07/09/2020   Lab  Results  Component Value Date   LDLCALC 77 07/09/2020   Lab Results  Component Value Date   TRIG 126 07/09/2020   Lab Results  Component Value Date   CHOLHDL 2.8 07/09/2020   Lab Results   Component Value Date   HGBA1C 5.3 10/12/2016      Assessment & Plan:   #1 recurrent impaction left ear canal -Discussed risk and benefits of ear irrigation including risk of bleeding, pain, low risk of eardrum perforation and patient consents.  She is aware of increased risk of bleeding on Coumadin -Nurse attempted irrigation just once and patient had pain and could not tolerate.  We do not see any evidence for trauma.  No bleeding from the canal.  We used curette and attempted once to remove and patient also could not tolerate.  She has very sensitive ear canal. -We will set up ENT referral  #2 history of atrial fibrillation on chronic Coumadin -INR 3.6.  Route to RN over our Coumadin clinic  No orders of the defined types were placed in this encounter.   Follow-up: No follow-ups on file.    [name], MD

## 2020-10-15 NOTE — Patient Instructions (Signed)

## 2020-10-16 ENCOUNTER — Telehealth (INDEPENDENT_AMBULATORY_CARE_PROVIDER_SITE_OTHER): Payer: Self-pay

## 2020-10-16 ENCOUNTER — Ambulatory Visit (INDEPENDENT_AMBULATORY_CARE_PROVIDER_SITE_OTHER): Payer: Medicare Other | Admitting: General Practice

## 2020-10-16 DIAGNOSIS — I4891 Unspecified atrial fibrillation: Secondary | ICD-10-CM

## 2020-10-16 NOTE — Patient Instructions (Addendum)
Pre visit review using our clinic review tool, if applicable. No additional management support is needed unless otherwise documented below in the visit note.  Skip a dosage today and change dosage and take 1 tablet daily except 1/2 tablet on Wednesday and Saturday.  Re-check in 3 to 4 weeks.

## 2020-10-21 ENCOUNTER — Ambulatory Visit: Payer: Medicare Other | Admitting: Cardiovascular Disease

## 2020-10-21 ENCOUNTER — Encounter: Payer: Self-pay | Admitting: Cardiovascular Disease

## 2020-10-21 ENCOUNTER — Other Ambulatory Visit: Payer: Self-pay

## 2020-10-21 VITALS — BP 150/85 | HR 74 | Ht 63.0 in | Wt 156.0 lb

## 2020-10-21 DIAGNOSIS — Z5181 Encounter for therapeutic drug level monitoring: Secondary | ICD-10-CM | POA: Diagnosis not present

## 2020-10-21 DIAGNOSIS — I4891 Unspecified atrial fibrillation: Secondary | ICD-10-CM | POA: Diagnosis not present

## 2020-10-21 DIAGNOSIS — I5033 Acute on chronic diastolic (congestive) heart failure: Secondary | ICD-10-CM

## 2020-10-21 DIAGNOSIS — I2510 Atherosclerotic heart disease of native coronary artery without angina pectoris: Secondary | ICD-10-CM | POA: Diagnosis not present

## 2020-10-21 DIAGNOSIS — I10 Essential (primary) hypertension: Secondary | ICD-10-CM | POA: Diagnosis not present

## 2020-10-21 MED ORDER — TORSEMIDE 20 MG PO TABS: 40.0000 mg | ORAL_TABLET | Freq: Two times a day (BID) | ORAL | 3 refills | Status: DC

## 2020-10-21 MED ORDER — TORSEMIDE 20 MG PO TABS: 20.0000 mg | ORAL_TABLET | Freq: Every day | ORAL | 3 refills | Status: DC

## 2020-10-21 NOTE — Progress Notes (Signed)
Cardiology Office Note   Date:  10/21/2020   ID:  Catherine [name], DOB [birth year], MRN [id number]  PCP:  Kristian CoveyBurchette, Bruce W, MD  Cardiologist:   Chilton Siiffany Parks, MD  Cardiothoracic surgeon: Dr. Cornelius Moraswen  No chief complaint on file.    History of Present Illness: Catherine Ishikawaeggy M Wands is a 86 y.o. 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 Catherine [name] was previously a patient of Dr. Patty SermonsBrackbill. She underwent mitral commissurotomy in the 1970s. On her echocardiogram 02/2014 she had an ejection fraction of 60-65% with moderate to severe mitral regurgitation. There was also mild aortic regurgitation. Her follow-up echo 02/2015 showed an EF of 60-65% with moderate to severe mitral regurgitation, moderate tricuspid regurgitation and elevated pulmonary artery pressures.  Repeat echo 9/11/7 revealed moderate pulmonary stenosis with moderate to severe mitral regurgitation.  She did not have any pulmonary hypertension and her LV was not dilated.  She was referred for left and right heart catheterization. She was found to have severe mitral stenosis with a 13 mmHg mean gradient, MVA 0.99 cm^2. She also had 50% LCx and 80% RCA lesions.  She had a 29 mm bioprosthetic mitral valve, single vessel CABG and MAZE with Dr. Cornelius Moraswen on 10/14/16.  She was referred for a baseline echo 02/15/17 that revealed LVEF 60-65% with mild aortic stenosis (mean gradient 12 mmHg).  Her bioprosthetic mitral valve was functioning well.    After her surgery Catherine [name] continued to report fatigue.  It was unclear whether she was in atrial fibrillation or sinus rhythm with a long PR interval.  She was referred to the atrial fibrillation clinic where this remained unclear.  However, it was felt that if she were in atrial fibrillation it would not be possible to maintain sinus rhythm.  She  developed lower extremity edema. She stopped amlodipine and this improved. This was switched to irbesartan. She then developed diaphoresis and insomnia that she attributed to this medication.  At her last appointment Catherine [name] had some mild volume overload.  Her morning dose of Lasix was increased.  Se felt much better after increasing the lasix.  However in the last week or two she has noticed more fluid retension and weight increase.  She hasn't changed anytig in her diet and wonders why.  She rarely eats out (though she has a [employer] biscuit after her morning exercise).  She otherwise tries to limit her salt intake.  [facility]  Her mood is better since she has been around people.  Lately her blood pressure has been labile.  She notes that it goes up whenever she is anxious and she has been struggling with a lot of anxiety lately.  She feels good when she works out and has no chest pain.  She feels bloated and gets short of breath when she tries to bend over.  She also notes some lower extremity edema but has no orthopnea or PND.  Past Medical History:  Diagnosis Date  . Acute on chronic diastolic heart failure (HCC)   . Aortic stenosis, mild 07/26/2017  . Arthritis   . Asthma    last attack 02/2015  . Atrial fibrillation, chronic (HCC)   . Cellulitis of left lower extremity 08/17/2016   Ulcer associated with severe venous insufficiency  . Chronic anticoagulation   . Chronic diastolic CHF (congestive  heart failure) ([place])   . Chronic kidney disease    "RIGHT MANY KIDNEY INFECTIONS AND STONES"  . COPD (chronic obstructive pulmonary disease) ([place])   . Coronary artery disease   . Dizziness   . H/O: rheumatic fever   . Heart murmur   . Hypertension   . PONV (postoperative nausea and vomiting)    ' SOMETIMES', BUT NOT ALWAYS"  . S/P Maze operation for atrial fibrillation 10/14/2016   Complete bilateral atrial lesion set using cryothermy and bipolar radiofrequency  ablation - atrial appendage was not treated due to previous surgical procedure (open mitral commissurotomy)  . S/P mitral valve replacement with bioprosthetic valve 10/14/2016   29 mm Medtronic Mosaic porcine bioprosthetic tissue valve  . UTI (urinary tract infection)   . Valvular heart disease    Has mitral stenosis with prior mitral commissurotomy in 1970    Past Surgical History:  Procedure Laterality Date  . ABDOMINAL HYSTERECTOMY  1983   endometriosis  . APPENDECTOMY    . BACK SURGERY     neurosurgery x2  . CARDIAC CATHETERIZATION    . CARDIAC CATHETERIZATION N/A 08/03/2016   Procedure: Right/Left Heart Cath and Coronary Angiography;  Surgeon: Peter M Martinique, MD;  Location: [facility];  Service: Cardiovascular;  Laterality: N/A;  . cataract surg    . CHOLECYSTECTOMY N/A 05/30/2015   Procedure: LAPAROSCOPIC CHOLECYSTECTOMY WITH INTRAOPERATIVE CHOLANGIOGRAM;  Surgeon: Excell Seltzer, MD;  Location: [facility];  Service: General;  Laterality: N/A;  . COLONOSCOPY    . CORONARY ARTERY BYPASS GRAFT N/A 10/14/2016   Procedure: CORONARY ARTERY BYPASS GRAFTING (CABG);  Surgeon: Rexene Alberts, MD;  Location: [facility];  Service: Open Heart Surgery;  Laterality: N/A;  . EYE SURGERY    . MAZE N/A 10/14/2016   Procedure: MAZE;  Surgeon: Rexene Alberts, MD;  Location: [facility];  Service: Open Heart Surgery;  Laterality: N/A;  . MITRAL VALVE REPLACEMENT N/A 10/14/2016   Procedure: REDO MITRAL VALVE REPLACEMENT (MVR);  Surgeon: Rexene Alberts, MD;  Location: [facility];  Service: Open Heart Surgery;  Laterality: N/A;  . MITRAL VALVE SURGERY Left 1970   Open mitral commissurotomy via left thoracotomy approach  . TEE WITHOUT CARDIOVERSION N/A 07/05/2016   Procedure: TRANSESOPHAGEAL ECHOCARDIOGRAM (TEE);  Surgeon: [name], MD;  Location: [facility];  Service: Cardiovascular;  Laterality: N/A;  . TEE WITHOUT CARDIOVERSION N/A 10/14/2016   Procedure: TRANSESOPHAGEAL ECHOCARDIOGRAM (TEE);  Surgeon:  Rexene Alberts, MD;  Location: [facility];  Service: Open Heart Surgery;  Laterality: N/A;    Current Outpatient Medications  Medication Sig Dispense Refill  . acetaminophen (TYLENOL) 500 MG tablet Take 500 mg by mouth daily.    Marland Kitchen albuterol (VENTOLIN HFA) 108 (90 Base) MCG/ACT inhaler Inhale 2 puffs into the lungs every 4 (four) hours as needed for wheezing or shortness of breath. 1 each 2  . aspirin EC 81 MG EC tablet Take 1 tablet (81 mg total) by mouth daily.    . budesonide-formoterol (SYMBICORT) 80-4.5 MCG/ACT inhaler Inhale 2 puffs into the lungs 2 (two) times daily. (Patient taking differently: Inhale 1 puff into the lungs 2 (two) times daily.) 1 Inhaler 11  . cephALEXin (KEFLEX) 250 MG capsule TAKE (1) CAPSULE DAILY 30 capsule 3  . diazepam (VALIUM) 5 MG tablet TAKE ONE TABLET AT BEDTIME 30 tablet 5  . doxazosin (CARDURA) 8 MG tablet Take 1 tablet (8 mg total) by mouth daily. 90 tablet 3  . fluconazole (DIFLUCAN) 150 MG tablet  TAKE 1 TABLET FOR 1 DOSE 1 tablet 0  . metoprolol succinate (TOPROL-XL) 25 MG 24 hr tablet Take 1 tablet (25 mg total) by mouth daily. 90 tablet 1  . Multiple Vitamins-Minerals (HAIR/SKIN/NAILS/BIOTIN PO) Take 1 tablet by mouth daily.    . potassium chloride (KLOR-CON) 10 MEQ tablet TAKE 8 TABLETS DAILY 720 tablet 3  . rosuvastatin (CRESTOR) 5 MG tablet TAKE 1/2 TO 1 TABLET ON MONDAY AND FRIDAY AS TOLERATED 20 tablet 5  . warfarin (COUMADIN) 2 MG tablet Take 1 tablet daily or take as directed by anticoagulation clinic 100 tablet 1  . torsemide (DEMADEX) 20 MG tablet Take 2 tablets (40 mg total) by mouth 2 (two) times daily. 180 tablet 3   No current facility-administered medications for this visit.    Allergies:   Aldactone [spironolactone], Amoxicillin, Diltiazem, Flagyl [metronidazole hcl], Flovent [fluticasone propionate], Gabapentin, Lyrica [pregabalin], Quinidine, Simvastatin, Tramadol, Verapamil, Amlodipine, Zetia [ezetimibe], Ace inhibitors, Benazepril hcl,  Ciprocin-fluocin-procin [fluocinolone acetonide], Ciprofloxacin, Codeine, and Nitrofurantoin monohyd macro    Social History:  The patient  reports that she quit smoking about 45 years ago. Her smoking use included cigarettes. She has a 15.00 pack-year smoking history. She has never used smokeless tobacco. She reports that she does not drink alcohol and does not use drugs.   Family History:  The patient's family history includes Leukemia in her father.    ROS:  Please see the history of present illness.   Otherwise, review of systems are positive for chest wall soreness.   All other systems are reviewed and negative.    PHYSICAL EXAM: VS:  BP (!) 150/85   Pulse 74   Ht 5\' 3"  (1.6 m)   Wt 156 lb (70.8 kg)   SpO2 95%   BMI 27.63 kg/m  , BMI Body mass index is 27.63 kg/m. GENERAL:  Well appearing HEENT: Pupils equal round and reactive, fundi not visualized, oral mucosa unremarkable NECK:  No jugular venous distention, waveform within normal limits, carotid upstroke brisk and symmetric, no bruits LUNGS:  Clear to auscultation bilaterally HEART:  RRR. Marland Kitchen.  PMI not displaced or sustained,S1 and S2 within normal limits, no S3, no S4, no clicks, no rubs, III/VI systolic murmur at the LUSB ABD:  Abdomen bloated. positive bowel sounds normal in frequency in pitch, no bruits, no rebound, no guarding, no midline pulsatile mass, no hepatomegaly, no splenomegaly EXT:  2 plus pulses throughout, 1+ LLE edema, no cyanosis no clubbing SKIN: L LE erythema and chronic stasis dermatitis NEURO:  Cranial nerves II through XII grossly intact, motor grossly intact throughout PSYCH:  Cognitively intact, oriented to person place and time   EKG:  EKG is ordered today. The ekg ordered 05/21/16 demonstrates atrial fibrillateion.  Prior septal infarct.  Rate 60 bpm.  Unchanged from 10/22/15. 10/27/16: Sinus rhythm.  Rate 60 bpm.  First degree heart block. 02/01/17: Sinus bradycardia with sinus arrhythmia and first  degree block.   03/08/17: Sinus bradycardia with sinus arrhythmia.   First degree heart block.  Junctional escape.  QTc 497 ms.   04/26/17: Atrial fibrillation.  Rate 69 bpm. 01/05/18: Accelerated junctional rhythm.  Rate 68 bpm.  Incomplete RBBB.  10/10/18: Sinus rhythm.  Rate 89 bpm.  Nonspecific ST changes.  06/06/19: Atrial fibrillation.  Rate 61 bpm.  Prior septal infarct. 01/10/20: Atrial fibrillation. Rate 56 bpm.  Prior septal infarct. 07/21/20: Atrial fibrillation.  Rate 70 bpm.  Prior septal infarct.  Echo 07/06/18: Study Conclusions  - Left ventricle: The cavity  size was normal. Systolic function was   vigorous. The estimated ejection fraction was in the range of 65%   to 70%. Wall motion was normal; there were no regional wall   motion abnormalities. Doppler parameters are consistent with high   ventricular filling pressure. - Aortic valve: Trileaflet; mildly thickened, mildly calcified   leaflets. There was mild stenosis. Peak velocity (S): 266 cm/s.   Mean gradient (S): 16 mm Hg. - Mitral valve: A bioprosthesis was present and functioning   normally. - Left atrium: The atrium was severely dilated. Anterior-posterior   dimension: 60 mm. - Tricuspid valve: There was mild regurgitation.  Echo 09/2019: 1. Left ventricular ejection fraction, by visual estimation, is 65 to  70%. The left ventricle has hyperdynamic function. Left ventricular septal  wall thickness was mildly increased. There is mildly increased left  ventricular hypertrophy.  2. The left ventricle has no regional wall motion abnormalities.  3. Global right ventricle has normal systolic function.The right  ventricular size is normal. No increase in right ventricular wall  thickness.  4. Left atrial size was moderately dilated.  5. Right atrial size was moderately dilated.  6. The mitral valve has been repaired/replaced. No evidence of mitral  valve regurgitation. No evidence of mitral stenosis.  7. Patient  has a 29 mm biprosthetic valve functioning normally with no  PVL The mitral struts protrude into the LVOT quite a bit likely  contributing to some of the gradient across the LVOT/AV. Creates a small  "neointimal LVOT" area.  8. The tricuspid valve is normal in structure.  9. The aortic valve is tricuspid. Aortic valve regurgitation is not  visualized. Mild aortic valve stenosis.  10. Pulmonic regurgitation is mild.  11. The pulmonic valve was grossly normal. Pulmonic valve regurgitation is  mild.  12. Mildly elevated pulmonary artery systolic pressure.   LHC/RHC 08/03/16: The left ventricular systolic function is normal.  LV end diastolic pressure is normal.  The left ventricular ejection fraction is 55-65% by visual estimate.  There is no aortic valve stenosis.  There is severe mitral valve stenosis.  Prox Cx to Mid Cx lesion, 50 %stenosed.  Mid RCA-2 lesion, 80 %stenosed.  Mid RCA-1 lesion, 80 %stenosed.  Hemodynamic findings consistent with mild pulmonary hypertension.  LV end diastolic pressure is normal.   1. Coronary artery disease   - 50% mid LCx   - 80% sequential lesions in the mid RCA 2. Normal LV function 3. Severe mitral stenosis. MV gradient of 13 mm Hg. MVA 0.99 cm squared with index 0.57. 4. Moderate to severe mitral insufficiency 5. Mild pulmonary HTN. 6. Normal LV EDP 7. No significant AV gradient 8. Occluded right brachial artery.  Carotid Dopplers 10/12/16: 1-39% bilateral ICA stenosis  7 Day Event Monitor 06/28/17:  Quality: Fair.  Baseline artifact. Predominant rhythm: Sinus rhythm with PACs and sinus arrhythmia.    Cannot  rule out episodes of atrial fibrillation.  Recent Labs: 07/09/2020: ALT 13; Pro B Natriuretic peptide (BNP) 253.0; TSH 3.95 07/29/2020: BUN 17; Creat 0.94; Hemoglobin 12.8; Platelets 185; Potassium 3.8; Sodium 143    Lipid Panel    Component Value Date/Time   CHOL 153 07/09/2020 1403   CHOL 222 (H) 06/06/2019 1116    TRIG 126 07/09/2020 1403   HDL 55 07/09/2020 1403   HDL 53 06/06/2019 1116   CHOLHDL 2.8 07/09/2020 1403   VLDL 42.8 (H) 09/24/2019 1054   LDLCALC 77 07/09/2020 1403   LDLDIRECT 68.0 09/24/2019 1054  Wt Readings from Last 3 Encounters:  10/21/20 156 lb (70.8 kg)  10/15/20 156 lb (70.8 kg)  09/09/20 152 lb (68.9 kg)     ASSESSMENT AND PLAN: # Rheumatic mitral valve disease: # s/p bioprosthetic mitral valve: # Acute on chronic diastolic heart failure: Catherine [name] is volume overloaded and not responding as well to lasix.  We will switch to torsemide 40mg  bid.  Check a BMP in a week.  She needs antibiotics prior to dental procedures.   I also suggested that she keep her fluids <2L daily.  Limit sodium intake.  # Mild aortic stenosis: Mean gradient 16 mmHg on 06/2018.  This improved to 11 mmHg on her echo 09/2019.  # Persistent atrial fibrillation:  # Bradycardia: # Palpitations: Rate well-controlled.  Continue warfarin due to her valvular disease. Will discuss whether she wants to use a DOAC given the valve guideline updates.  # Hypertension:  Blood pressure is persistently above goal.  She attributes that to anxiety today.  We will continue to monitor as we diurese her.  Continue doxazosin and metoprolol for now.  # Nonobstructive coronary disease: # Hyperlipidemia:  Has not tolerated statins and muscle cramps on Zetia. She is on low dose rosuvastatin.   Current medicines are reviewed at length with the patient today.  The patient does not have concerns regarding medicines.  The following changes have been made:  none  Labs/ tests ordered today include:   Orders Placed This Encounter  Procedures  . Basic metabolic panel    Disposition:   FU with Catherine Sonnen C. Oval Linsey, MD, [facility] in 2 months.    Signed, Savien Mamula C. Oval Linsey, MD, [facility]  10/21/2020 12:56 PM    [place]

## 2020-10-21 NOTE — Patient Instructions (Addendum)
Medication Instructions:  STOP FUROSEMIDE   START TORSEMIDE 20 MG 2 TABLETS TWICE A DAY   *If you need a refill on your cardiac medications before your next appointment, please call your pharmacy*  Lab Work: BMET IN 1 WEEK   If you have labs (blood work) drawn today and your tests are completely normal, you will receive your results only by: Marland Kitchen MyChart Message (if you have MyChart) OR . A paper copy in the mail If you have any lab test that is abnormal or we need to change your treatment, we will call you to review the results.   Testing/Procedures: NONE   Follow-Up: At [facility], you and your health needs are our priority.  As part of our continuing mission to provide you with exceptional heart care, we have created designated Provider Care Teams.  These Care Teams include your primary Cardiologist (physician) and Advanced Practice Providers (APPs -  Physician Assistants and Nurse Practitioners) who all work together to provide you with the care you need, when you need it.  We recommend signing up for the patient portal called "MyChart".  Sign up information is provided on this After Visit Summary.  MyChart is used to connect with patients for Virtual Visits (Telemedicine).  Patients are able to view lab/test results, encounter notes, upcoming appointments, etc.  Non-urgent messages can be sent to your provider as well.   To learn more about what you can do with MyChart, go to [email or web address].    Your next appointment:   2 month(s)  The format for your next appointment:   In Person  Provider:   You may see [name], MD or one of the following Advanced Practice Providers on your designated Care Team:    Kerin Ransom, PA-C  [place], [place]  Coletta Memos, [place]

## 2020-10-22 ENCOUNTER — Ambulatory Visit (INDEPENDENT_AMBULATORY_CARE_PROVIDER_SITE_OTHER): Payer: Medicare Other | Admitting: Otolaryngology

## 2020-10-22 VITALS — Temp 97.9°F

## 2020-10-22 DIAGNOSIS — H6123 Impacted cerumen, bilateral: Secondary | ICD-10-CM | POA: Diagnosis not present

## 2020-10-22 NOTE — Progress Notes (Signed)
HPI: Catherine [name] is a 86 y.o. female who presents is referred by Dr. Elease Hashimoto for evaluation of wax buildup in her left ear. This was attempted to be removed at Dr. Erick Blinks office but it caused a lot of pain and little bit of bleeding and she was referred here..  Past Medical History:  Diagnosis Date  . Acute on chronic diastolic heart failure ([place])   . Aortic stenosis, mild 07/26/2017  . Arthritis   . Asthma    last attack 02/2015  . Atrial fibrillation, chronic ([place])   . Cellulitis of left lower extremity 08/17/2016   Ulcer associated with severe venous insufficiency  . Chronic anticoagulation   . Chronic diastolic CHF (congestive heart failure) ([place])   . Chronic kidney disease    "RIGHT MANY KIDNEY INFECTIONS AND STONES"  . COPD (chronic obstructive pulmonary disease) ([place])   . Coronary artery disease   . Dizziness   . H/O: rheumatic fever   . Heart murmur   . Hypertension   . PONV (postoperative nausea and vomiting)    ' SOMETIMES', BUT NOT ALWAYS"  . S/P Maze operation for atrial fibrillation 10/14/2016   Complete bilateral atrial lesion set using cryothermy and bipolar radiofrequency ablation - atrial appendage was not treated due to previous surgical procedure (open mitral commissurotomy)  . S/P mitral valve replacement with bioprosthetic valve 10/14/2016   29 mm Medtronic Mosaic porcine bioprosthetic tissue valve  . UTI (urinary tract infection)   . Valvular heart disease    Has mitral stenosis with prior mitral commissurotomy in 1970   Past Surgical History:  Procedure Laterality Date  . ABDOMINAL HYSTERECTOMY  1983   endometriosis  . APPENDECTOMY    . BACK SURGERY     neurosurgery x2  . CARDIAC CATHETERIZATION    . CARDIAC CATHETERIZATION N/A 08/03/2016   Procedure: Right/Left Heart Cath and Coronary Angiography;  Surgeon: Peter M Martinique, MD;  Location: [facility];  Service: Cardiovascular;  Laterality: N/A;  . cataract surg    . CHOLECYSTECTOMY N/A  05/30/2015   Procedure: LAPAROSCOPIC CHOLECYSTECTOMY WITH INTRAOPERATIVE CHOLANGIOGRAM;  Surgeon: Excell Seltzer, MD;  Location: [facility];  Service: General;  Laterality: N/A;  . COLONOSCOPY    . CORONARY ARTERY BYPASS GRAFT N/A 10/14/2016   Procedure: CORONARY ARTERY BYPASS GRAFTING (CABG);  Surgeon: Rexene Alberts, MD;  Location: [facility];  Service: Open Heart Surgery;  Laterality: N/A;  . EYE SURGERY    . MAZE N/A 10/14/2016   Procedure: MAZE;  Surgeon: Rexene Alberts, MD;  Location: [facility];  Service: Open Heart Surgery;  Laterality: N/A;  . MITRAL VALVE REPLACEMENT N/A 10/14/2016   Procedure: REDO MITRAL VALVE REPLACEMENT (MVR);  Surgeon: Rexene Alberts, MD;  Location: [facility];  Service: Open Heart Surgery;  Laterality: N/A;  . MITRAL VALVE SURGERY Left 1970   Open mitral commissurotomy via left thoracotomy approach  . TEE WITHOUT CARDIOVERSION N/A 07/05/2016   Procedure: TRANSESOPHAGEAL ECHOCARDIOGRAM (TEE);  Surgeon: [name], MD;  Location: [facility];  Service: Cardiovascular;  Laterality: N/A;  . TEE WITHOUT CARDIOVERSION N/A 10/14/2016   Procedure: TRANSESOPHAGEAL ECHOCARDIOGRAM (TEE);  Surgeon: Rexene Alberts, MD;  Location: [facility];  Service: Open Heart Surgery;  Laterality: N/A;   Social History   Socioeconomic History  . Marital status: Married    Spouse name: Not on file  . Number of children: Not on file  . Years of education: Not on file  . Highest education level: Not on  file  Occupational History  . Not on file  Tobacco Use  . Smoking status: Former Smoker    Packs/day: 1.00    Years: 15.00    Pack years: 15.00    Types: Cigarettes    Quit date: 09/28/1975    Years since quitting: 45.0  . Smokeless tobacco: Never Used  Vaping Use  . Vaping Use: Never used  Substance and Sexual Activity  . Alcohol use: No    Alcohol/week: 0.0 standard drinks  . Drug use: No  . Sexual activity: Yes  Other Topics Concern  . Not on file  Social History Narrative  . Not on  file   Social Determinants of Health   Financial Resource Strain: High Risk  . Difficulty of Paying Living Expenses: Hard  Food Insecurity: Not on file  Transportation Needs: No Transportation Needs  . Lack of Transportation (Medical): No  . Lack of Transportation (Non-Medical): No  Physical Activity: Not on file  Stress: Not on file  Social Connections: Not on file   Family History  Problem Relation Age of Onset  . Leukemia Father   . Breast cancer Neg Hx    Allergies  Allergen Reactions  . Aldactone [Spironolactone] Other (See Comments)    dyspnea  . Amoxicillin Palpitations    Tachycardia Has patient had a PCN reaction causing immediate rash, facial/tongue/throat swelling, SOB or lightheadedness with hypotension: no Has patient had a PCN reaction causing severe rash involving mucus membranes or skin necrosis: {no Has patient had a PCN reaction that required hospitalization {no Has patient had a PCN reaction occurring within the last 10 years: {yes If all of the above answers are "NO", then may proceed with Cephalosporin use.  . Diltiazem Other (See Comments)    Causing headaches   . Flagyl [Metronidazole Hcl] Other (See Comments)    Causing headaches    . Flovent [Fluticasone Propionate] Other (See Comments)    Leg cramps  . Gabapentin Swelling  . Lyrica [Pregabalin] Swelling  . Quinidine Diarrhea and Other (See Comments)    Fever diarrhea  . Simvastatin Other (See Comments)    Leg pain, myalgia  . Tramadol Nausea Only  . Verapamil Other (See Comments)    myalgias  . Amlodipine     Low extremity edema  . Zetia [Ezetimibe]     LEG CRAMPS   . Ace Inhibitors Other (See Comments)    unknown  . Benazepril Hcl Cough  . Ciprocin-Fluocin-Procin [Fluocinolone Acetonide] Other (See Comments)    unknown  . Ciprofloxacin Diarrhea  . Codeine Nausea Only  . Nitrofurantoin Monohyd Macro Nausea Only   Prior to Admission medications   Medication Sig Start Date End  Date Taking? Authorizing Provider  acetaminophen (TYLENOL) 500 MG tablet Take 500 mg by mouth daily.    [provider]  albuterol (VENTOLIN HFA) 108 (90 Base) MCG/ACT inhaler Inhale 2 puffs into the lungs every 4 (four) hours as needed for wheezing or shortness of breath. 07/09/20   Tanda Rockers, MD  aspirin EC 81 MG EC tablet Take 1 tablet (81 mg total) by mouth daily. 10/22/16   Gold, Wayne E, PA-C  budesonide-formoterol (SYMBICORT) 80-4.5 MCG/ACT inhaler Inhale 2 puffs into the lungs 2 (two) times daily. Patient taking differently: Inhale 1 puff into the lungs 2 (two) times daily. 02/18/20   Tanda Rockers, MD  cephALEXin (KEFLEX) 250 MG capsule TAKE (1) CAPSULE DAILY 06/09/20   Burchette, Alinda Sierras, MD  diazepam (VALIUM) 5 MG tablet  TAKE ONE TABLET AT BEDTIME 05/23/20   Burchette, Alinda Sierras, MD  doxazosin (CARDURA) 8 MG tablet Take 1 tablet (8 mg total) by mouth daily. 07/21/20   [name], MD  fluconazole (DIFLUCAN) 150 MG tablet TAKE 1 TABLET FOR 1 DOSE 09/13/20   Burchette, Alinda Sierras, MD  metoprolol succinate (TOPROL-XL) 25 MG 24 hr tablet Take 1 tablet (25 mg total) by mouth daily. 07/04/20   Burchette, Alinda Sierras, MD  Multiple Vitamins-Minerals (HAIR/SKIN/NAILS/BIOTIN PO) Take 1 tablet by mouth daily.    [provider]  potassium chloride (KLOR-CON) 10 MEQ tablet TAKE 8 TABLETS DAILY 07/21/20   [name], MD  rosuvastatin (CRESTOR) 5 MG tablet TAKE 1/2 TO 1 TABLET ON MONDAY AND FRIDAY AS TOLERATED 02/29/20   [name], MD  torsemide (DEMADEX) 20 MG tablet Take 2 tablets (40 mg total) by mouth 2 (two) times daily. 10/21/20 01/19/21  [name], MD  warfarin (COUMADIN) 2 MG tablet Take 1 tablet daily or take as directed by anticoagulation clinic 06/04/20   [name], MD     Positive ROS: Otherwise negative  All other systems have been reviewed and were otherwise negative with the exception of those mentioned in the HPI and as above.  Physical  Exam: Constitutional: Alert, well-appearing, no acute distress Ears: External ears without lesions or tenderness. Her ear canals are small on both sides. She had a little wax on the right side that was cleaned with a curette. The left ear canal was completely occluded with some wax and little bit of dried blood that was removed with forceps and curettes. The TMs were clear bilaterally. Nasal: External nose without lesions.. Clear nasal passages Oral: Lips and gums without lesions. Tongue and palate mucosa without lesions. Posterior oropharynx clear. Neck: No palpable adenopathy or masses Respiratory: Breathing comfortably  Skin: No facial/neck lesions or rash noted.  Cerumen impaction removal  Date/Time: 10/22/2020 12:44 PM Performed by: Rozetta Nunnery, MD Authorized by: Rozetta Nunnery, MD   Consent:    Consent obtained:  Verbal   Consent given by:  Patient   Risks discussed:  Pain and bleeding Procedure details:    Location:  L ear and R ear   Procedure type: curette and forceps   Post-procedure details:    Inspection:  TM intact and canal normal   Hearing quality:  Improved   Patient tolerance of procedure:  Tolerated well, no immediate complications Comments:     TMs are clear bilaterally.    Assessment: Left ear cerumen impaction with minimal wax buildup on the right side.  Plan: This was cleaned in the office today with minimal discomfort. Both TMs were clear. She will follow-up as needed.   Radene Journey, MD   CC:

## 2020-10-29 ENCOUNTER — Ambulatory Visit (INDEPENDENT_AMBULATORY_CARE_PROVIDER_SITE_OTHER): Payer: Medicare Other | Admitting: General Practice

## 2020-10-29 ENCOUNTER — Other Ambulatory Visit: Payer: Self-pay

## 2020-10-29 DIAGNOSIS — I10 Essential (primary) hypertension: Secondary | ICD-10-CM | POA: Diagnosis not present

## 2020-10-29 DIAGNOSIS — Z5181 Encounter for therapeutic drug level monitoring: Secondary | ICD-10-CM | POA: Diagnosis not present

## 2020-10-29 DIAGNOSIS — I4891 Unspecified atrial fibrillation: Secondary | ICD-10-CM | POA: Diagnosis not present

## 2020-10-29 LAB — POCT INR: INR: 2.7 (ref 2.0–3.0)

## 2020-10-29 NOTE — Patient Instructions (Addendum)
Pre visit review using our clinic review tool, if applicable. No additional management support is needed unless otherwise documented below in the visit note.  Continue to take 1 tablet daily except 1/2 tablet on Wednesday and Saturday.  Re-check in 4 weeks.   

## 2020-10-30 LAB — BASIC METABOLIC PANEL
BUN/Creatinine Ratio: 18 (ref 12–28)
BUN: 16 mg/dL (ref 8–27)
CO2: 26 mmol/L (ref 20–29)
Calcium: 9.3 mg/dL (ref 8.7–10.3)
Chloride: 101 mmol/L (ref 96–106)
Creatinine, Ser: 0.91 mg/dL (ref 0.57–1.00)
GFR calc Af Amer: 67 mL/min/{1.73_m2} (ref >59)
GFR calc non Af Amer: 59 mL/min/{1.73_m2} — ABNORMAL LOW (ref >59)
Glucose: 120 mg/dL — ABNORMAL HIGH (ref 65–99)
Potassium: 3.5 mmol/L (ref 3.5–5.2)
Sodium: 144 mmol/L (ref 134–144)

## 2020-11-03 ENCOUNTER — Ambulatory Visit: Payer: Medicare Other

## 2020-11-10 ENCOUNTER — Telehealth: Payer: Self-pay | Admitting: Cardiovascular Disease

## 2020-11-10 NOTE — Telephone Encounter (Signed)
Reviewed labs with patient and advised would call back if any changes after reviewed by Dr Oval Linsey

## 2020-11-10 NOTE — Telephone Encounter (Signed)
Returning call. Thanks!

## 2020-11-10 NOTE — Telephone Encounter (Signed)
    Patient calling for lab results 

## 2020-11-12 ENCOUNTER — Telehealth: Payer: Self-pay | Admitting: Family Medicine

## 2020-11-12 NOTE — Telephone Encounter (Signed)
Left message for patient to call back and schedule Medicare Annual Wellness Visit (AWV) either virtually or in office. No detailed message left   Last AWVI no information  please schedule at anytime with [facility] Nurse Health Advisor 1 or 2   This should be a 45 minute visit. 

## 2020-11-17 ENCOUNTER — Other Ambulatory Visit: Payer: Self-pay | Admitting: Family Medicine

## 2020-11-27 ENCOUNTER — Ambulatory Visit: Payer: Medicare Other | Admitting: Cardiovascular Disease

## 2020-11-27 ENCOUNTER — Encounter: Payer: Self-pay | Admitting: Cardiovascular Disease

## 2020-11-27 ENCOUNTER — Other Ambulatory Visit: Payer: Self-pay

## 2020-11-27 VITALS — BP 122/78 | HR 52 | Ht 63.0 in | Wt 155.6 lb

## 2020-11-27 DIAGNOSIS — I10 Essential (primary) hypertension: Secondary | ICD-10-CM | POA: Diagnosis not present

## 2020-11-27 DIAGNOSIS — I350 Nonrheumatic aortic (valve) stenosis: Secondary | ICD-10-CM

## 2020-11-27 DIAGNOSIS — R0602 Shortness of breath: Secondary | ICD-10-CM | POA: Diagnosis not present

## 2020-11-27 DIAGNOSIS — I5033 Acute on chronic diastolic (congestive) heart failure: Secondary | ICD-10-CM | POA: Diagnosis not present

## 2020-11-27 NOTE — Progress Notes (Signed)
Cardiology Office Note   Date:  11/27/2020   ID:  Catherine [name], DOB 1935-02-24, MRN [id number]  PCP:  [name], MD  Cardiologist:   [name], MD  Cardiothoracic surgeon: Dr. Roxy Manns  No chief complaint on file.    History of Present Illness: Catherine [name] is a 86 y.o. 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 Catherine [name] was previously a patient of [name]. She underwent mitral commissurotomy in the 1970s. On her echocardiogram 02/2014 she had an ejection fraction of 60-65% with moderate to severe mitral regurgitation. There was also mild aortic regurgitation. Her follow-up echo 02/2015 showed an EF of 60-65% with moderate to severe mitral regurgitation, moderate tricuspid regurgitation and elevated pulmonary artery pressures.  Repeat echo 9/11/7 revealed moderate pulmonary stenosis with moderate to severe mitral regurgitation.  She did not have any pulmonary hypertension and her LV was not dilated.  She was referred for left and right heart catheterization. She was found to have severe mitral stenosis with a 13 mmHg mean gradient, MVA 0.99 cm^2. She also had 50% LCx and 80% RCA lesions.  She had a 29 mm bioprosthetic mitral valve, single vessel CABG and MAZE with Dr. Roxy Manns on 10/14/16.  She was referred for a baseline echo 02/15/17 that revealed LVEF 60-65% with mild aortic stenosis (mean gradient 12 mmHg).  Her bioprosthetic mitral valve was functioning well.    After her surgery Catherine [name] continued to report fatigue.  It was unclear whether she was in atrial fibrillation or sinus rhythm with a long PR interval.  She was referred to the atrial fibrillation clinic where this remained unclear.  However, it was felt that if she were in atrial fibrillation it would not be possible to maintain sinus rhythm.  She  developed lower extremity edema. She stopped amlodipine and this improved. This was switched to irbesartan. She then developed diaphoresis and insomnia that she attributed to this medication.    At her last appointment she was switched from lasix to torsemide.  This helped her abdominal bloating but she still has LE edema.  She notices that it is worse on the R leg than the left.  She has been unable to tolerate compression socks.  She notes that the swelling is worse at the end of the day and gets better by morning.  She struggles with leg cramps and dry mouth.  She continues to feel very tired.  She goes to the [facility] twice per week.  She has a lot of soreness in her thighs from doing the squats.  Her breathing has been better with exercise.  She gets some shortness of breath in the AM when she is getting dressed.  She has noted a rash on her legs that Dr. Elease Hashimoto recommended for triamcinolone.     Past Medical History:  Diagnosis Date  . Acute on chronic diastolic heart failure ([place])   . Aortic stenosis, mild 07/26/2017  . Arthritis   . Asthma    last attack 02/2015  . Atrial fibrillation, chronic ([place])   . Cellulitis of left lower extremity 08/17/2016   Ulcer associated with severe venous insufficiency  . Chronic anticoagulation   . Chronic diastolic CHF (congestive heart failure) ([place])   . Chronic kidney disease    "RIGHT MANY KIDNEY INFECTIONS AND STONES"  . COPD (chronic obstructive pulmonary  disease) ([place])   . Coronary artery disease   . Dizziness   . H/O: rheumatic fever   . Heart murmur   . Hypertension   . PONV (postoperative nausea and vomiting)    ' SOMETIMES', BUT NOT ALWAYS"  . S/P Maze operation for atrial fibrillation 10/14/2016   Complete bilateral atrial lesion set using cryothermy and bipolar radiofrequency ablation - atrial appendage was not treated due to previous surgical procedure (open mitral commissurotomy)  . S/P mitral valve replacement with bioprosthetic valve  10/14/2016   29 mm Medtronic Mosaic porcine bioprosthetic tissue valve  . UTI (urinary tract infection)   . Valvular heart disease    Has mitral stenosis with prior mitral commissurotomy in 1970    Past Surgical History:  Procedure Laterality Date  . ABDOMINAL HYSTERECTOMY  1983   endometriosis  . APPENDECTOMY    . BACK SURGERY     neurosurgery x2  . CARDIAC CATHETERIZATION    . CARDIAC CATHETERIZATION N/A 08/03/2016   Procedure: Right/Left Heart Cath and Coronary Angiography;  Surgeon: Peter M Martinique, MD;  Location: [place] [facility];  Service: Cardiovascular;  Laterality: N/A;  . cataract surg    . CHOLECYSTECTOMY N/A 05/30/2015   Procedure: LAPAROSCOPIC CHOLECYSTECTOMY WITH INTRAOPERATIVE CHOLANGIOGRAM;  Surgeon: Excell Seltzer, MD;  Location: [place];  Service: General;  Laterality: N/A;  . COLONOSCOPY    . CORONARY ARTERY BYPASS GRAFT N/A 10/14/2016   Procedure: CORONARY ARTERY BYPASS GRAFTING (CABG);  Surgeon: Rexene Alberts, MD;  Location: [place];  Service: Open Heart Surgery;  Laterality: N/A;  . EYE SURGERY    . MAZE N/A 10/14/2016   Procedure: MAZE;  Surgeon: Rexene Alberts, MD;  Location: [place];  Service: Open Heart Surgery;  Laterality: N/A;  . MITRAL VALVE REPLACEMENT N/A 10/14/2016   Procedure: REDO MITRAL VALVE REPLACEMENT (MVR);  Surgeon: Rexene Alberts, MD;  Location: [place];  Service: Open Heart Surgery;  Laterality: N/A;  . MITRAL VALVE SURGERY Left 1970   Open mitral commissurotomy via left thoracotomy approach  . TEE WITHOUT CARDIOVERSION N/A 07/05/2016   Procedure: TRANSESOPHAGEAL ECHOCARDIOGRAM (TEE);  Surgeon: [name], MD;  Location: [place];  Service: Cardiovascular;  Laterality: N/A;  . TEE WITHOUT CARDIOVERSION N/A 10/14/2016   Procedure: TRANSESOPHAGEAL ECHOCARDIOGRAM (TEE);  Surgeon: Rexene Alberts, MD;  Location: [place];  Service: Open Heart Surgery;  Laterality: N/A;    Current Outpatient Medications  Medication Sig Dispense Refill  .  acetaminophen (TYLENOL) 500 MG tablet Take 500 mg by mouth daily.    Marland Kitchen albuterol (VENTOLIN HFA) 108 (90 Base) MCG/ACT inhaler Inhale 2 puffs into the lungs every 4 (four) hours as needed for wheezing or shortness of breath. 1 each 2  . aspirin EC 81 MG EC tablet Take 1 tablet (81 mg total) by mouth daily.    . budesonide-formoterol (SYMBICORT) 80-4.5 MCG/ACT inhaler Inhale 2 puffs into the lungs 2 (two) times daily. (Patient taking differently: Inhale 1 puff into the lungs 2 (two) times daily.) 1 Inhaler 11  . cephALEXin (KEFLEX) 250 MG capsule TAKE (1) CAPSULE DAILY 30 capsule 3  . diazepam (VALIUM) 5 MG tablet TAKE ONE TABLET AT BEDTIME 30 tablet 5  . doxazosin (CARDURA) 8 MG tablet Take 1 tablet (8 mg total) by mouth daily. 90 tablet 3  . metoprolol succinate (TOPROL-XL) 25 MG 24 hr tablet Take 1 tablet (25 mg total) by mouth daily. 90 tablet 1  . Multiple Vitamins-Minerals (HAIR/SKIN/NAILS/BIOTIN PO) Take 1 tablet  by mouth daily.    . potassium chloride (KLOR-CON) 10 MEQ tablet TAKE 8 TABLETS DAILY 720 tablet 3  . rosuvastatin (CRESTOR) 5 MG tablet TAKE 1/2 TO 1 TABLET ON MONDAY AND FRIDAY AS TOLERATED 20 tablet 5  . torsemide (DEMADEX) 20 MG tablet Take 2 tablets (40 mg total) by mouth 2 (two) times daily. 180 tablet 3  . warfarin (COUMADIN) 2 MG tablet Take 1 tablet daily or take as directed by anticoagulation clinic 100 tablet 1   No current facility-administered medications for this visit.    Allergies:   Aldactone [spironolactone], Amoxicillin, Diltiazem, Flagyl [metronidazole hcl], Flovent [fluticasone propionate], Gabapentin, Lyrica [pregabalin], Quinidine, Simvastatin, Tramadol, Verapamil, Amlodipine, Zetia [ezetimibe], Ace inhibitors, Benazepril hcl, Ciprocin-fluocin-procin [fluocinolone acetonide], Ciprofloxacin, Codeine, and Nitrofurantoin monohyd macro    Social History:  The patient  reports that she quit smoking about 45 years ago. Her smoking use included cigarettes. She has a  15.00 pack-year smoking history. She has never used smokeless tobacco. She reports that she does not drink alcohol and does not use drugs.   Family History:  The patient's family history includes Leukemia in her father.    ROS:  Please see the history of present illness.   Otherwise, review of systems are positive for chest wall soreness.   All other systems are reviewed and negative.    PHYSICAL EXAM: VS:  BP 122/78   Pulse (!) 52   Ht 5\' 3"  (1.6 m)   Wt 155 lb 9.6 oz (70.6 kg)   SpO2 95%   BMI 27.56 kg/m  , BMI Body mass index is 27.56 kg/m. GENERAL:  Well appearing HEENT: Pupils equal round and reactive, fundi not visualized, oral mucosa unremarkable NECK:  No jugular venous distention, waveform within normal limits, carotid upstroke brisk and symmetric, no bruits LUNGS:  Clear to auscultation bilaterally HEART:  RRR.  PMI not displaced or sustained,S1 and S2 within normal limits, no S3, no S4, no clicks, no rubs, III/VI systolic murmur at the LUSB ABD:  Abdomen bloated. positive bowel sounds normal in frequency in pitch, no bruits, no rebound, no guarding, no midline pulsatile mass, no hepatomegaly, no splenomegaly EXT:  2 plus pulses throughout, 2+ LLE edema, no cyanosis no clubbing SKIN: L LE erythema and chronic stasis dermatitis NEURO:  Cranial nerves II through XII grossly intact, motor grossly intact throughout PSYCH:  Cognitively intact, oriented to person place and time   EKG:  EKG is not ordered today. The ekg ordered 05/21/16 demonstrates atrial fibrillateion.  Prior septal infarct.  Rate 60 bpm.  Unchanged from 10/22/15. 10/27/16: Sinus rhythm.  Rate 60 bpm.  First degree heart block. 02/01/17: Sinus bradycardia with sinus arrhythmia and first degree block.   03/08/17: Sinus bradycardia with sinus arrhythmia.   First degree heart block.  Junctional escape.  QTc 497 ms.   04/26/17: Atrial fibrillation.  Rate 69 bpm. 01/05/18: Accelerated junctional rhythm.  Rate 68 bpm.   Incomplete RBBB.  10/10/18: Sinus rhythm.  Rate 89 bpm.  Nonspecific ST changes.  06/06/19: Atrial fibrillation.  Rate 61 bpm.  Prior septal infarct. 01/10/20: Atrial fibrillation. Rate 56 bpm.  Prior septal infarct. 07/21/20: Atrial fibrillation.  Rate 70 bpm.  Prior septal infarct.  Echo 07/06/18: Study Conclusions  - Left ventricle: The cavity size was normal. Systolic function was   vigorous. The estimated ejection fraction was in the range of 65%   to 70%. Wall motion was normal; there were no regional wall   motion abnormalities. Doppler parameters  are consistent with high   ventricular filling pressure. - Aortic valve: Trileaflet; mildly thickened, mildly calcified   leaflets. There was mild stenosis. Peak velocity (S): 266 cm/s.   Mean gradient (S): 16 mm Hg. - Mitral valve: A bioprosthesis was present and functioning   normally. - Left atrium: The atrium was severely dilated. Anterior-posterior   dimension: 60 mm. - Tricuspid valve: There was mild regurgitation.  Echo 09/2019: 1. Left ventricular ejection fraction, by visual estimation, is 65 to  70%. The left ventricle has hyperdynamic function. Left ventricular septal  wall thickness was mildly increased. There is mildly increased left  ventricular hypertrophy.  2. The left ventricle has no regional wall motion abnormalities.  3. Global right ventricle has normal systolic function.The right  ventricular size is normal. No increase in right ventricular wall  thickness.  4. Left atrial size was moderately dilated.  5. Right atrial size was moderately dilated.  6. The mitral valve has been repaired/replaced. No evidence of mitral  valve regurgitation. No evidence of mitral stenosis.  7. Patient has a 29 mm biprosthetic valve functioning normally with no  PVL The mitral struts protrude into the LVOT quite a bit likely  contributing to some of the gradient across the LVOT/AV. Creates a small  "neointimal LVOT" area.   8. The tricuspid valve is normal in structure.  9. The aortic valve is tricuspid. Aortic valve regurgitation is not  visualized. Mild aortic valve stenosis.  10. Pulmonic regurgitation is mild.  11. The pulmonic valve was grossly normal. Pulmonic valve regurgitation is  mild.  12. Mildly elevated pulmonary artery systolic pressure.   LHC/RHC 08/03/16: The left ventricular systolic function is normal.  LV end diastolic pressure is normal.  The left ventricular ejection fraction is 55-65% by visual estimate.  There is no aortic valve stenosis.  There is severe mitral valve stenosis.  Prox Cx to Mid Cx lesion, 50 %stenosed.  Mid RCA-2 lesion, 80 %stenosed.  Mid RCA-1 lesion, 80 %stenosed.  Hemodynamic findings consistent with mild pulmonary hypertension.  LV end diastolic pressure is normal.   1. Coronary artery disease   - 50% mid LCx   - 80% sequential lesions in the mid RCA 2. Normal LV function 3. Severe mitral stenosis. MV gradient of 13 mm Hg. MVA 0.99 cm squared with index 0.57. 4. Moderate to severe mitral insufficiency 5. Mild pulmonary HTN. 6. Normal LV EDP 7. No significant AV gradient 8. Occluded right brachial artery.  Carotid Dopplers 10/12/16: 1-39% bilateral ICA stenosis  7 Day Event Monitor 06/28/17:  Quality: Fair.  Baseline artifact. Predominant rhythm: Sinus rhythm with PACs and sinus arrhythmia.    Cannot  rule out episodes of atrial fibrillation.  Recent Labs: 07/09/2020: ALT 13; Pro B Natriuretic peptide (BNP) 253.0; TSH 3.95 07/29/2020: Hemoglobin 12.8; Platelets 185 10/29/2020: BUN 16; Creatinine, Ser 0.91; Potassium 3.5; Sodium 144    Lipid Panel    Component Value Date/Time   CHOL 153 07/09/2020 1403   CHOL 222 (H) 06/06/2019 1116   TRIG 126 07/09/2020 1403   HDL 55 07/09/2020 1403   HDL 53 06/06/2019 1116   CHOLHDL 2.8 07/09/2020 1403   VLDL 42.8 (H) 09/24/2019 1054   LDLCALC 77 07/09/2020 1403   LDLDIRECT 68.0 09/24/2019  1054      Wt Readings from Last 3 Encounters:  11/27/20 155 lb 9.6 oz (70.6 kg)  10/21/20 156 lb (70.8 kg)  10/15/20 156 lb (70.8 kg)     ASSESSMENT AND PLAN: #  Rheumatic mitral valve disease: # s/p bioprosthetic mitral valve: # Chronic diastolic heart failure: Volume status seems better since increasing torsemide.  She has less bloated and no longer has elevated JVD.  However she does have bilateral lower extremity edema.  I am suspicious this this may be more attributable to venous insufficiency.  She has not been able to tolerate compression socks.  Continue torsemide.  Check a BMP and a BNP today.  She also is having some cramping so we will check a magnesium to ensure she is not over diuresed or having any electrolyte issues.  We did discuss referral to vein and vascular but she prefers to wait at this time.  # Mild aortic stenosis: Mean gradient 16 mmHg on 06/2018.  This improved to 11 mmHg on her echo 09/2019.  # Persistent atrial fibrillation:  # Bradycardia: # Palpitations: Rate well-controlled.  Continue warfarin due to her valvular disease. Will discuss whether she wants to use a DOAC given the valve guideline updates.  # Hypertension:  Blood pressure is p well-controlled today.  Continue metoprolol and doxazosin.  # Nonobstructive coronary disease: # Hyperlipidemia:  Has not tolerated statins and muscle cramps on Zetia. She is on low dose rosuvastatin.  She is again having cramping.  She will hold her rosuvastatin for couple weeks and see if this helps.  If it does not then she will resume it.  If it does she will let us know and we will try an alternative agent.  Time spent: 35 minutes-Greater than 50% of this time was spent in counseling, explanation of diagnosis, planning of further management, and coordination of care.    Current medicines are reviewed at length with the patient today.  The patient does not have concerns regarding medicines.  The following changes  have been made:  none  Labs/ tests ordered today include:   No orders of the defined types were placed in this encounter.   Disposition:   FU with  C. Oval Linsey, MD, [facility] in 3 months.    Signed,  C. Oval Linsey, MD, [facility]  11/27/2020 1:50 PM    [place]

## 2020-11-27 NOTE — Patient Instructions (Signed)
Medication Instructions:  STOP IRON TABLETS   HOLD YOUR ROSUVASTATIN FOR 2 WEEKS AND CALL THE UPDATE ON HOW YOU ARE FEELING   *If you need a refill on your cardiac medications before your next appointment, please call your pharmacy*  Lab Work: BNP/BMET/CBC/MAGNESIUM TODAY   If you have labs (blood work) drawn today and your tests are completely normal, you will receive your results only by: Marland Kitchen MyChart Message (if you have MyChart) OR . A paper copy in the mail If you have any lab test that is abnormal or we need to change your treatment, we will call you to review the results.  Testing/Procedures: NONE  Follow-Up: At [facility], you and your health needs are our priority.  As part of our continuing mission to provide you with exceptional heart care, we have created designated Provider Care Teams.  These Care Teams include your primary Cardiologist (physician) and Advanced Practice Providers (APPs -  Physician Assistants and Nurse Practitioners) who all work together to provide you with the care you need, when you need it.  We recommend signing up for the patient portal called "MyChart".  Sign up information is provided on this After Visit Summary.  MyChart is used to connect with patients for Virtual Visits (Telemedicine).  Patients are able to view lab/test results, encounter notes, upcoming appointments, etc.  Non-urgent messages can be sent to your provider as well.   To learn more about what you can do with MyChart, go to [email or web address].    Your next appointment:   3 month(s)  The format for your next appointment:   In Person  Provider:   You may see [name], MD or one of the following Advanced Practice Providers on your designated Care Team:    [place], PA-C  Coletta Memos, FNP

## 2020-11-28 ENCOUNTER — Encounter: Payer: Self-pay | Admitting: Cardiovascular Disease

## 2020-11-28 LAB — CBC WITH DIFFERENTIAL/PLATELET
Basophils Absolute: 0 10*3/uL (ref 0.0–0.2)
Basos: 1 %
EOS (ABSOLUTE): 0.1 10*3/uL (ref 0.0–0.4)
Eos: 2 %
Hematocrit: 39.9 % (ref 34.0–46.6)
Hemoglobin: 13.1 g/dL (ref 11.1–15.9)
Immature Grans (Abs): 0 10*3/uL (ref 0.0–0.1)
Immature Granulocytes: 0 %
Lymphocytes Absolute: 1.5 10*3/uL (ref 0.7–3.1)
Lymphs: 27 %
MCH: 29.8 pg (ref 26.6–33.0)
MCHC: 32.8 g/dL (ref 31.5–35.7)
MCV: 91 fL (ref 79–97)
Monocytes Absolute: 0.5 10*3/uL (ref 0.1–0.9)
Monocytes: 9 %
Neutrophils Absolute: 3.5 10*3/uL (ref 1.4–7.0)
Neutrophils: 61 %
Platelets: 173 10*3/uL (ref 150–450)
RBC: 4.39 x10E6/uL (ref 3.77–5.28)
RDW: 13.7 % (ref 11.7–15.4)
WBC: 5.7 10*3/uL (ref 3.4–10.8)

## 2020-11-28 LAB — BASIC METABOLIC PANEL
BUN/Creatinine Ratio: 15 (ref 12–28)
BUN: 14 mg/dL (ref 8–27)
CO2: 23 mmol/L (ref 20–29)
Calcium: 9.4 mg/dL (ref 8.7–10.3)
Chloride: 104 mmol/L (ref 96–106)
Creatinine, Ser: 0.92 mg/dL (ref 0.57–1.00)
Glucose: 89 mg/dL (ref 65–99)
Potassium: 3.5 mmol/L (ref 3.5–5.2)
Sodium: 147 mmol/L — ABNORMAL HIGH (ref 134–144)
eGFR: 62 mL/min/{1.73_m2} (ref >59)

## 2020-11-28 LAB — BRAIN NATRIURETIC PEPTIDE: BNP: 129.8 pg/mL — ABNORMAL HIGH (ref 0.0–100.0)

## 2020-11-28 LAB — MAGNESIUM: Magnesium: 2.2 mg/dL (ref 1.6–2.3)

## 2020-12-01 ENCOUNTER — Ambulatory Visit (INDEPENDENT_AMBULATORY_CARE_PROVIDER_SITE_OTHER): Payer: Medicare Other | Admitting: General Practice

## 2020-12-01 ENCOUNTER — Other Ambulatory Visit: Payer: Self-pay

## 2020-12-01 DIAGNOSIS — I4891 Unspecified atrial fibrillation: Secondary | ICD-10-CM

## 2020-12-01 LAB — POCT INR: INR: 1.9 — AB (ref 2.0–3.0)

## 2020-12-01 NOTE — Patient Instructions (Addendum)
Pre visit review using our clinic review tool, if applicable. No additional management support is needed unless otherwise documented below in the visit note.  Take 1 1/2 tablets today (3/7) and then continue to take 1 tablet daily except 1/2 tablet on Wednesday and Saturday.  Re-check in 4 weeks.

## 2020-12-10 ENCOUNTER — Ambulatory Visit: Payer: Medicare Other | Admitting: Cardiovascular Disease

## 2020-12-16 ENCOUNTER — Telehealth: Payer: Self-pay | Admitting: Cardiovascular Disease

## 2020-12-16 DIAGNOSIS — E7800 Pure hypercholesterolemia, unspecified: Secondary | ICD-10-CM

## 2020-12-16 NOTE — Telephone Encounter (Signed)
  Has taken Zetia, Simvastatin, and now Crestor  Patient aware will be next week before call back  Will forward to Dr Oval Linsey for review

## 2020-12-16 NOTE — Telephone Encounter (Signed)
Please get her a PharmD appointment for PCSK9 inhibitor

## 2020-12-16 NOTE — Telephone Encounter (Signed)
Pt c/o medication issue:  1. Name of Medication: rosuvastatin (CRESTOR) 5 MG tablet  2. How are you currently taking this medication (dosage and times per day)? Stopped taking for 2 weeks  3. Are you having a reaction (difficulty breathing--STAT)? no  4. What is your medication issue? Patient states she was told to stop taking the medication for 2 weeks and to call back. She states her legs are better now.

## 2020-12-18 NOTE — Telephone Encounter (Signed)
Please refer to PharmD for PCSK9 inhibitor

## 2020-12-22 ENCOUNTER — Other Ambulatory Visit: Payer: Self-pay | Admitting: Family Medicine

## 2020-12-22 NOTE — Telephone Encounter (Signed)
She was on this for UTI prophylaxis.  Refill once more

## 2020-12-22 NOTE — Telephone Encounter (Signed)
Advised patient and scheduled for 4/26

## 2020-12-22 NOTE — Telephone Encounter (Signed)
Please advise. Should the patient continue this medication? 

## 2020-12-29 ENCOUNTER — Ambulatory Visit (INDEPENDENT_AMBULATORY_CARE_PROVIDER_SITE_OTHER): Payer: Medicare Other | Admitting: General Practice

## 2020-12-29 ENCOUNTER — Other Ambulatory Visit: Payer: Self-pay

## 2020-12-29 DIAGNOSIS — I4891 Unspecified atrial fibrillation: Secondary | ICD-10-CM

## 2020-12-29 LAB — POCT INR: INR: 2.5 (ref 2.0–3.0)

## 2020-12-29 NOTE — Patient Instructions (Addendum)
Pre visit review using our clinic review tool, if applicable. No additional management support is needed unless otherwise documented below in the visit note.  Continue to take 1 tablet daily except 1/2 tablet on Wednesday and Saturday.  Re-check in 6 weeks.

## 2021-01-07 ENCOUNTER — Telehealth: Payer: Self-pay | Admitting: Pharmacist

## 2021-01-07 NOTE — Chronic Care Management (AMB) (Addendum)
Chronic Care Management Pharmacy Assistant   Name: Catherine [name]  MRN: [id number] DOB: 04-May-1935  Reason for Encounter: Disease State   Conditions to be addressed/monitored: HLD  Recent office visits:  01.19.2022 Catherine Post, MD (PCP)- Ceruminosis 12.17.2022 Catherine Ruddy, MD- Video Visit- COPD Exacerbation Medication Change doxycycline (VIBRA-TABS) 100 mg: One tablet twice a day for 7 days 12.14.2021 Catherine Post, MD (PCP)_ Annual Exam Medication Change Triamcinolone Acetonide 0.1 % 1 application Topical 2 times daily (Discontinued) 11.10.2022 Catherine Post, MD(PCP) - Abdominal wall hematoma Medication Change Triamcinolone Acetonide 0.1 % 1 application Topical 2 times daily (started)  Recent consult visits:  03.03.2022 Catherine Latch, MD Cardiology- Shortness of breath 01.26.2022 Catherine Nunnery, MD Otolaryngology- Bilateral impacted cerumen 01.25.2022 Catherine Latch, MD Cardiology- Essential hypertension Medication Change Torsemide 40 mg Oral 2 times daily Furosemide 40 mg -discontinued 12.15.2021 Catherine [name] Dermatology-Actinic keratosis 11.15.2021 Catherine [name] Ophthalmology-Retinal telangiectasis, right eye  Hospital visits:  None in previous 6 months  Medications: Outpatient Encounter Medications as of 01/07/2021  Medication Sig   acetaminophen (TYLENOL) 500 MG tablet Take 500 mg by mouth daily.   albuterol (VENTOLIN HFA) 108 (90 Base) MCG/ACT inhaler Inhale 2 puffs into the lungs every 4 (four) hours as needed for wheezing or shortness of breath.   aspirin EC 81 MG EC tablet Take 1 tablet (81 mg total) by mouth daily.   budesonide-formoterol (SYMBICORT) 80-4.5 MCG/ACT inhaler Inhale 2 puffs into the lungs 2 (two) times daily. (Patient taking differently: Inhale 1 puff into the lungs 2 (two) times daily.)   cephALEXin (KEFLEX) 250 MG capsule TAKE (1) CAPSULE DAILY   diazepam (VALIUM) 5 MG tablet TAKE ONE TABLET AT  BEDTIME   doxazosin (CARDURA) 8 MG tablet Take 1 tablet (8 mg total) by mouth daily.   metoprolol succinate (TOPROL-XL) 25 MG 24 hr tablet Take 1 tablet (25 mg total) by mouth daily.   Multiple Vitamins-Minerals (HAIR/SKIN/NAILS/BIOTIN PO) Take 1 tablet by mouth daily.   potassium chloride (KLOR-CON) 10 MEQ tablet TAKE 8 TABLETS DAILY   rosuvastatin (CRESTOR) 5 MG tablet TAKE 1/2 TO 1 TABLET ON MONDAY AND FRIDAY AS TOLERATED   torsemide (DEMADEX) 20 MG tablet Take 2 tablets (40 mg total) by mouth 2 (two) times daily.   warfarin (COUMADIN) 2 MG tablet Take 1 tablet daily or take as directed by anticoagulation clinic   No facility-administered encounter medications on file as of 01/07/2021.   01/07/2021 Name: Catherine [name] MRN: [id number] DOB: 06/06/1935 Catherine [name] How is a 86 y.o. year old female who is a primary care patient of Burchette, Catherine Sierras, MD.  Comprehensive medication review performed; Spoke to patient regarding cholesterol  Lipid Panel    Component Value Date/Time   CHOL 153 07/09/2020 1403   CHOL 222 (H) 06/06/2019 1116   TRIG 126 07/09/2020 1403   HDL 55 07/09/2020 1403   HDL 53 06/06/2019 1116   LDLCALC 77 07/09/2020 1403   LDLDIRECT 68.0 09/24/2019 1054    10-year ASCVD risk score: The ASCVD Risk score Catherine Bussing DC Jr., et al., 2013) failed to calculate for the following reasons:   The 2013 ASCVD risk score is only valid for ages 96 to 44  Current antihyperlipidemic regimen:  No medications Previous antihyperlipidemic medications tried: None ASCVD risk enhancing conditions: HTN What recent interventions/DTPs have been made by any provider to improve Cholesterol control since last CPP Visit: None Any recent hospitalizations or ED visits since last visit with CPP? No  What diet changes have been made to improve Cholesterol?  No changes to her diet. What exercise is being done to improve Cholesterol?  She goes to the [facility] twice a week  Adherence Review: Does the patient  have >5 day gap between last estimated fill dates? No   I spoke with the patient and reviewed medication inherence. She stated that she was having a few problems with her medications. She discontinued her Rosuvastatin 5 mg two weeks ago due to leg cramps. She states that she still has leg cramps, but they have eased up a little since stopping the medication. She informed me that she has an appointment with cardiology at the end of the month with the pharmacist to discuss her cholesterol. Torsemide 20 mg was also changed to four tablets daily. She has not had any issues with this change of medications. Recently she went to a life screening nearby and they ran a test on her arteries. She states that she will get the results in about three weeks. She says she still is experiencing low energy. She continues to go to the [facility] for silver sneakers twice a week. There have been no other changes in her medications. She denies any side effects from her current medications. There has not been any ED or urgent care visit since the last CPP or PCP visit. I will follow up with the patient in one month to schedule a new CCM appointment. There are no issues with her current pharmacy  Star Rating Drugs:  [facility]  Rosuvastatin  01.04.2022 [facility] ([place]) [place], [place] [phone]

## 2021-01-09 ENCOUNTER — Other Ambulatory Visit: Payer: Self-pay | Admitting: Family Medicine

## 2021-01-12 ENCOUNTER — Ambulatory Visit (INDEPENDENT_AMBULATORY_CARE_PROVIDER_SITE_OTHER): Payer: Medicare Other | Admitting: Family Medicine

## 2021-01-12 ENCOUNTER — Encounter: Payer: Self-pay | Admitting: Family Medicine

## 2021-01-12 ENCOUNTER — Telehealth: Payer: Self-pay | Admitting: Cardiovascular Disease

## 2021-01-12 ENCOUNTER — Other Ambulatory Visit: Payer: Self-pay

## 2021-01-12 VITALS — BP 132/70 | HR 68 | Temp 98.3°F | Wt 154.2 lb

## 2021-01-12 DIAGNOSIS — I10 Essential (primary) hypertension: Secondary | ICD-10-CM

## 2021-01-12 DIAGNOSIS — L03114 Cellulitis of left upper limb: Secondary | ICD-10-CM

## 2021-01-12 DIAGNOSIS — I4891 Unspecified atrial fibrillation: Secondary | ICD-10-CM

## 2021-01-12 DIAGNOSIS — J449 Chronic obstructive pulmonary disease, unspecified: Secondary | ICD-10-CM

## 2021-01-12 MED ORDER — FLUCONAZOLE 150 MG PO TABS: 150.0000 mg | ORAL_TABLET | Freq: Once | ORAL | 0 refills | Status: AC

## 2021-01-12 MED ORDER — CEPHALEXIN 500 MG PO CAPS: 500.0000 mg | ORAL_CAPSULE | Freq: Four times a day (QID) | ORAL | 0 refills | Status: DC

## 2021-01-12 NOTE — Telephone Encounter (Signed)
Returned call to patient of Dr. Oval Linsey who states she got a letter from Dr. Gustavus Bryant office saying he was no longer a provider covered with her insurance. She would like recommendations of another pulmonologist. Advised that she call Dr. Gustavus Bryant office to ask about this letter and call insurance to ask about providers that are covered.   She would like the message sent to Dr. Awanda Mink LPN stating "Rip Harbour and I are good friends"  Message routed

## 2021-01-12 NOTE — Telephone Encounter (Signed)
PT is calling with concerns her lung doctor sent her a letter, they no longer take her insurance and she has 2 weeks to find another practice. She would like some help finding another lung doctor,Pease advise

## 2021-01-12 NOTE — Telephone Encounter (Signed)
From Dr. Oval Linsey, recommendations: Dr. Lamonte Sakai, Dr. Lavell Anchors, Dr. Valeta Harms.  Left detailed message with names of providers (OK per DPR)

## 2021-01-12 NOTE — Progress Notes (Signed)
Established Patient Office Visit  Subjective:  Patient ID: Catherine [name], female    DOB: June 29, 1935  Age: 86 y.o. MRN: [id number]  CC:  Chief Complaint  Patient presents with  . Hand Injury    X 1 week, hit wrist/hand on a corner, bled for 12 hours straight, swollen, inflamed, very painful    HPI Catherine [name] presents for the following items  Left wrist injury.  She states about 9 days ago she accidentally hit her left wrist against the corner of a rail.  Did not really notice much pain or swelling until a few days afterwards.  She has had some progressive redness and swelling since then.  Occasional "oozing "of some yellowish fluid.  No fevers or chills.  No difficulties with range of motion left wrist.  No other injuries reported.  She has history of hypertension.  Blood pressure currently stable.  Was placed per cardiology on torsemide in place of furosemide.  She is on fairly high dose of 20 mg 2 tablets twice daily.  She feels somewhat less bloated but still has significant edema in her lower extremities and weight is essentially unchanged.  Recent renal function reviewed and relatively stable.  Recent electrolytes back in March were stable.  She has COPD and takes Symbicort.  She is struggled to get her Symbicort because of cost.  A friend of hers had some 160 mg Symbicort and she has questions regarding whether she would be safe to take this dosage.  She feels her COPD symptoms are relatively stable.  Past Medical History:  Diagnosis Date  . Acute on chronic diastolic heart failure ([place])   . Aortic stenosis, mild 07/26/2017  . Arthritis   . Asthma    last attack 02/2015  . Atrial fibrillation, chronic ([place])   . Cellulitis of left lower extremity 08/17/2016   Ulcer associated with severe venous insufficiency  . Chronic anticoagulation   . Chronic diastolic CHF (congestive heart failure) ([place])   . Chronic kidney disease    "RIGHT MANY KIDNEY INFECTIONS AND STONES"  . COPD  (chronic obstructive pulmonary disease) ([place])   . Coronary artery disease   . Dizziness   . H/O: rheumatic fever   . Heart murmur   . Hypertension   . PONV (postoperative nausea and vomiting)    ' SOMETIMES', BUT NOT ALWAYS"  . S/P Maze operation for atrial fibrillation 10/14/2016   Complete bilateral atrial lesion set using cryothermy and bipolar radiofrequency ablation - atrial appendage was not treated due to previous surgical procedure (open mitral commissurotomy)  . S/P mitral valve replacement with bioprosthetic valve 10/14/2016   29 mm Medtronic Mosaic porcine bioprosthetic tissue valve  . UTI (urinary tract infection)   . Valvular heart disease    Has mitral stenosis with prior mitral commissurotomy in 1970    Past Surgical History:  Procedure Laterality Date  . ABDOMINAL HYSTERECTOMY  1983   endometriosis  . APPENDECTOMY    . BACK SURGERY     neurosurgery x2  . CARDIAC CATHETERIZATION    . CARDIAC CATHETERIZATION N/A 08/03/2016   Procedure: Right/Left Heart Cath and Coronary Angiography;  Surgeon: Peter M Martinique, MD;  Location: [facility];  Service: Cardiovascular;  Laterality: N/A;  . cataract surg    . CHOLECYSTECTOMY N/A 05/30/2015   Procedure: LAPAROSCOPIC CHOLECYSTECTOMY WITH INTRAOPERATIVE CHOLANGIOGRAM;  Surgeon: Excell Seltzer, MD;  Location: [facility];  Service: General;  Laterality: N/A;  . COLONOSCOPY    . CORONARY  ARTERY BYPASS GRAFT N/A 10/14/2016   Procedure: CORONARY ARTERY BYPASS GRAFTING (CABG);  Surgeon: Rexene Alberts, MD;  Location: [facility];  Service: Open Heart Surgery;  Laterality: N/A;  . EYE SURGERY    . MAZE N/A 10/14/2016   Procedure: MAZE;  Surgeon: Rexene Alberts, MD;  Location: [facility];  Service: Open Heart Surgery;  Laterality: N/A;  . MITRAL VALVE REPLACEMENT N/A 10/14/2016   Procedure: REDO MITRAL VALVE REPLACEMENT (MVR);  Surgeon: Rexene Alberts, MD;  Location: [facility];  Service: Open Heart Surgery;  Laterality: N/A;  . MITRAL VALVE SURGERY  Left 1970   Open mitral commissurotomy via left thoracotomy approach  . TEE WITHOUT CARDIOVERSION N/A 07/05/2016   Procedure: TRANSESOPHAGEAL ECHOCARDIOGRAM (TEE);  Surgeon: [name], MD;  Location: [facility];  Service: Cardiovascular;  Laterality: N/A;  . TEE WITHOUT CARDIOVERSION N/A 10/14/2016   Procedure: TRANSESOPHAGEAL ECHOCARDIOGRAM (TEE);  Surgeon: Rexene Alberts, MD;  Location: [facility];  Service: Open Heart Surgery;  Laterality: N/A;    Family History  Problem Relation Age of Onset  . Leukemia Father   . Breast cancer Neg Hx     Social History   Socioeconomic History  . Marital status: Married    Spouse name: Not on file  . Number of children: Not on file  . Years of education: Not on file  . Highest education level: Not on file  Occupational History  . Not on file  Tobacco Use  . Smoking status: Former Smoker    Packs/day: 1.00    Years: 15.00    Pack years: 15.00    Types: Cigarettes    Quit date: 09/28/1975    Years since quitting: 45.3  . Smokeless tobacco: Never Used  Vaping Use  . Vaping Use: Never used  Substance and Sexual Activity  . Alcohol use: No    Alcohol/week: 0.0 standard drinks  . Drug use: No  . Sexual activity: Yes  Other Topics Concern  . Not on file  Social History Narrative  . Not on file   Social Determinants of Health   Financial Resource Strain: High Risk  . Difficulty of Paying Living Expenses: Hard  Food Insecurity: Not on file  Transportation Needs: No Transportation Needs  . Lack of Transportation (Medical): No  . Lack of Transportation (Non-Medical): No  Physical Activity: Not on file  Stress: Not on file  Social Connections: Not on file  Intimate Partner Violence: Not on file    Outpatient Medications Prior to Visit  Medication Sig Dispense Refill  . acetaminophen (TYLENOL) 500 MG tablet Take 500 mg by mouth daily.    Marland Kitchen albuterol (VENTOLIN HFA) 108 (90 Base) MCG/ACT inhaler Inhale 2 puffs into the lungs every  4 (four) hours as needed for wheezing or shortness of breath. 1 each 2  . aspirin EC 81 MG EC tablet Take 1 tablet (81 mg total) by mouth daily.    . budesonide-formoterol (SYMBICORT) 80-4.5 MCG/ACT inhaler Inhale 2 puffs into the lungs 2 (two) times daily. (Patient taking differently: Inhale 1 puff into the lungs 2 (two) times daily.) 1 Inhaler 11  . cephALEXin (KEFLEX) 250 MG capsule TAKE (1) CAPSULE DAILY 30 capsule 0  . diazepam (VALIUM) 5 MG tablet TAKE ONE TABLET AT BEDTIME 30 tablet 5  . doxazosin (CARDURA) 8 MG tablet Take 1 tablet (8 mg total) by mouth daily. 90 tablet 3  . metoprolol succinate (TOPROL-XL) 25 MG 24 hr tablet TAKE 1 TABLET DAILY 90 tablet  1  . Multiple Vitamins-Minerals (HAIR/SKIN/NAILS/BIOTIN PO) Take 1 tablet by mouth daily.    . potassium chloride (KLOR-CON) 10 MEQ tablet TAKE 8 TABLETS DAILY 720 tablet 3  . rosuvastatin (CRESTOR) 5 MG tablet TAKE 1/2 TO 1 TABLET ON MONDAY AND FRIDAY AS TOLERATED 20 tablet 5  . torsemide (DEMADEX) 20 MG tablet Take 2 tablets (40 mg total) by mouth 2 (two) times daily. 180 tablet 3  . warfarin (COUMADIN) 2 MG tablet Take 1 tablet daily or take as directed by anticoagulation clinic 100 tablet 1   No facility-administered medications prior to visit.    Allergies  Allergen Reactions  . Aldactone [Spironolactone] Other (See Comments)    dyspnea  . Amoxicillin Palpitations    Tachycardia Has patient had a PCN reaction causing immediate rash, facial/tongue/throat swelling, SOB or lightheadedness with hypotension: no Has patient had a PCN reaction causing severe rash involving mucus membranes or skin necrosis: {no Has patient had a PCN reaction that required hospitalization {no Has patient had a PCN reaction occurring within the last 10 years: {yes If all of the above answers are "NO", then may proceed with Cephalosporin use.  . Diltiazem Other (See Comments)    Causing headaches   . Flagyl [Metronidazole Hcl] Other (See Comments)     Causing headaches    . Flovent [Fluticasone Propionate] Other (See Comments)    Leg cramps  . Gabapentin Swelling  . Lyrica [Pregabalin] Swelling  . Quinidine Diarrhea and Other (See Comments)    Fever diarrhea  . Simvastatin Other (See Comments)    Leg pain, myalgia  . Tramadol Nausea Only  . Verapamil Other (See Comments)    myalgias  . Amlodipine     Low extremity edema  . Zetia [Ezetimibe]     LEG CRAMPS   . Ace Inhibitors Other (See Comments)    unknown  . Benazepril Hcl Cough  . Ciprocin-Fluocin-Procin [Fluocinolone Acetonide] Other (See Comments)    unknown  . Ciprofloxacin Diarrhea  . Codeine Nausea Only  . Nitrofurantoin Monohyd Macro Nausea Only    ROS Review of Systems  Constitutional: Negative for chills and fever.  Respiratory: Negative for cough and shortness of breath.   Cardiovascular: Positive for leg swelling. Negative for chest pain.  Neurological: Negative for dizziness and syncope.      Objective:    Physical Exam Vitals reviewed.  Constitutional:      Appearance: Normal appearance.  Cardiovascular:     Rate and Rhythm: Normal rate.  Pulmonary:     Effort: Pulmonary effort is normal.     Breath sounds: Normal breath sounds.  Musculoskeletal:     Comments: She has trace to 1+ pitting edema lower legs bilaterally.  She states this is about near her baseline.  Skin:    Comments: She has flap laceration left dorsal and distal wrist.  Laceration is about 2 cm in length.  This is laying down nicely.  No skin necrosis.  She does have approximately 4 x 4 centimeter surrounding area of erythema.  No fluctuance.  Mildly tender.  Mild warmth.  Neurological:     Mental Status: She is alert.     BP 132/70 (BP Location: Left Arm, Patient Position: Sitting, Cuff Size: Normal)   Pulse 68   Temp 98.3 F (36.8 C) (Oral)   Wt 154 lb 3.2 oz (69.9 kg)   SpO2 94%   BMI 27.32 kg/m  Wt Readings from Last 3 Encounters:  01/12/21 154 lb 3.2 oz (69.9  kg)   11/27/20 155 lb 9.6 oz (70.6 kg)  10/21/20 156 lb (70.8 kg)     There are no preventive care reminders to display for this patient.  There are no preventive care reminders to display for this patient.  Lab Results  Component Value Date   TSH 3.95 07/09/2020   Lab Results  Component Value Date   WBC 5.7 11/27/2020   HGB 13.1 11/27/2020   HCT 39.9 11/27/2020   MCV 91 11/27/2020   PLT 173 11/27/2020   Lab Results  Component Value Date   NA 147 (H) 11/27/2020   K 3.5 11/27/2020   CO2 23 11/27/2020   GLUCOSE 89 11/27/2020   BUN 14 11/27/2020   CREATININE 0.92 11/27/2020   BILITOT 1.0 07/09/2020   ALKPHOS 94 09/24/2019   AST 22 07/09/2020   ALT 13 07/09/2020   PROT 7.1 07/09/2020   ALBUMIN 3.9 09/24/2019   CALCIUM 9.4 11/27/2020   ANIONGAP 11 09/06/2019   GFR 67.66 10/01/2019   Lab Results  Component Value Date   CHOL 153 07/09/2020   Lab Results  Component Value Date   HDL 55 07/09/2020   Lab Results  Component Value Date   LDLCALC 77 07/09/2020   Lab Results  Component Value Date   TRIG 126 07/09/2020   Lab Results  Component Value Date   CHOLHDL 2.8 07/09/2020   Lab Results  Component Value Date   HGBA1C 5.3 10/12/2016      Assessment & Plan:   #1 recent left wrist injury with flap laceration which appears to be healing uneventfully.  Unfortunately though she has some cellulitis type changes of the left wrist -Elevation of wrist frequently -Start Keflex 500 mg 4 times daily for 10 days -Consider starting probiotic -Follow-up for any fever, progressive redness, or other concerns  #2 hypertension stable and at goal -Continue current medications  #3 chronic atrial fibrillation.  Currently rate controlled. -Continue Coumadin with close follow-up through Coumadin clinic  #4 COPD-symptomatically stable -Patient had questions regarding Symbicort.  She has access to free samples of 160 mg and we felt this would be safe to take this in place of  her 80 mg dose   Meds ordered this encounter  Medications  . cephALEXin (KEFLEX) 500 MG capsule    Sig: Take 1 capsule (500 mg total) by mouth 4 (four) times daily.    Dispense:  40 capsule    Refill:  0  . fluconazole (DIFLUCAN) 150 MG tablet    Sig: Take 1 tablet (150 mg total) by mouth once for 1 dose.    Dispense:  1 tablet    Refill:  0    Follow-up: No follow-ups on file.    [name], MD

## 2021-01-12 NOTE — Patient Instructions (Signed)

## 2021-01-20 ENCOUNTER — Ambulatory Visit: Payer: Medicare Other

## 2021-01-20 ENCOUNTER — Ambulatory Visit (INDEPENDENT_AMBULATORY_CARE_PROVIDER_SITE_OTHER): Payer: Medicare Other | Admitting: Pharmacist

## 2021-01-20 ENCOUNTER — Other Ambulatory Visit: Payer: Self-pay

## 2021-01-20 ENCOUNTER — Telehealth: Payer: Self-pay

## 2021-01-20 VITALS — BP 134/78 | HR 58 | Resp 14 | Ht 63.0 in | Wt 155.2 lb

## 2021-01-20 DIAGNOSIS — I10 Essential (primary) hypertension: Secondary | ICD-10-CM | POA: Diagnosis not present

## 2021-01-20 DIAGNOSIS — I350 Nonrheumatic aortic (valve) stenosis: Secondary | ICD-10-CM | POA: Diagnosis not present

## 2021-01-20 DIAGNOSIS — E785 Hyperlipidemia, unspecified: Secondary | ICD-10-CM | POA: Diagnosis not present

## 2021-01-20 DIAGNOSIS — R0602 Shortness of breath: Secondary | ICD-10-CM | POA: Diagnosis not present

## 2021-01-20 DIAGNOSIS — I5033 Acute on chronic diastolic (congestive) heart failure: Secondary | ICD-10-CM | POA: Diagnosis not present

## 2021-01-20 MED ORDER — PITAVASTATIN CALCIUM 2 MG PO TABS: 2.0000 mg | ORAL_TABLET | Freq: Every day | ORAL | 0 refills | Status: DC

## 2021-01-20 NOTE — Progress Notes (Signed)
Patient ID: SHANNA UN                 DOB: [birth year]                    MRN: [id number]     HPI: Catherine [name] [name] is a 86 y.o. female patient referred to lipid clinic by Dr Oval Linsey. 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  Noted intolerance to simvastatin, rosuvastatin, and ezetimibe.  Patient presents for potential PCSK9i initiation.  Catherine [name] [name] presents in good spirit. Expresses interest to re-challenge with low dose rosuvastatin because legs still hurting on and off and unsure rosuvastatin was making it worst.  She is COMPLETELY against using any injectables.  Current Medications: none  Intolerances:  Simvastatin - leg pain, myalgia Ezetimibe - leg pain Rosuvastatin 5mg  every Monday and Friday - leg pain?  LDL goal: 70mg /dL  Exercise: walks 2-3 times per week  Family History: family history includes Leukemia in her father  Social History:  reports that she quit smoking about 45 years ago. Her smoking use included cigarettes. She has a 15.00 pack-year smoking history. She has never used smokeless tobacco. She reports that she does not drink alcohol and does not use drugs.   Labs: 07/09/2020: CHO 153, HDL 55, TG 126, LDL-c 77   Past Medical History:  Diagnosis Date  . Acute on chronic diastolic heart failure ([place])   . Aortic stenosis, mild 07/26/2017  . Arthritis   . Asthma    last attack 02/2015  . Atrial fibrillation, chronic ([place])   . Cellulitis of left lower extremity 08/17/2016   Ulcer associated with severe venous insufficiency  . Chronic anticoagulation   . Chronic diastolic CHF (congestive heart failure) ([place])   . Chronic kidney disease    "RIGHT MANY KIDNEY INFECTIONS AND STONES"  . COPD (chronic obstructive pulmonary disease) ([place])   . Coronary artery disease   . Dizziness   . H/O: rheumatic fever   . Heart murmur   .  Hypertension   . PONV (postoperative nausea and vomiting)    ' SOMETIMES', BUT NOT ALWAYS"  . S/P Maze operation for atrial fibrillation 10/14/2016   Complete bilateral atrial lesion set using cryothermy and bipolar radiofrequency ablation - atrial appendage was not treated due to previous surgical procedure (open mitral commissurotomy)  . S/P mitral valve replacement with bioprosthetic valve 10/14/2016   29 mm Medtronic Mosaic porcine bioprosthetic tissue valve  . UTI (urinary tract infection)   . Valvular heart disease    Has mitral stenosis with prior mitral commissurotomy in 1970    Current Outpatient Medications on File Prior to Visit  Medication Sig Dispense Refill  . acetaminophen (TYLENOL) 500 MG tablet Take 500 mg by mouth daily.    Marland Kitchen albuterol (VENTOLIN HFA) 108 (90 Base) MCG/ACT inhaler Inhale 2 puffs into the lungs every 4 (four) hours as needed for wheezing or shortness of breath. 1 each 2  . aspirin EC 81 MG EC tablet Take 1 tablet (81 mg total) by mouth daily.    . budesonide-formoterol (SYMBICORT) 80-4.5 MCG/ACT inhaler Inhale 2 puffs into the lungs 2 (two) times daily. (Patient taking differently: Inhale 1 puff into the lungs 2 (two) times daily.) 1 Inhaler 11  . cephALEXin (KEFLEX) 250 MG capsule TAKE (1) CAPSULE DAILY 30 capsule 0  . cephALEXin (KEFLEX) 500 MG capsule Take 1 capsule (  500 mg total) by mouth 4 (four) times daily. 40 capsule 0  . diazepam (VALIUM) 5 MG tablet TAKE ONE TABLET AT BEDTIME 30 tablet 5  . doxazosin (CARDURA) 8 MG tablet Take 1 tablet (8 mg total) by mouth daily. 90 tablet 3  . metoprolol succinate (TOPROL-XL) 25 MG 24 hr tablet TAKE 1 TABLET DAILY 90 tablet 1  . Multiple Vitamins-Minerals (HAIR/SKIN/NAILS/BIOTIN PO) Take 1 tablet by mouth daily.    . potassium chloride (KLOR-CON) 10 MEQ tablet TAKE 8 TABLETS DAILY 720 tablet 3  . warfarin (COUMADIN) 2 MG tablet Take 1 tablet daily or take as directed by anticoagulation clinic 100 tablet 1  .  torsemide (DEMADEX) 20 MG tablet Take 2 tablets (40 mg total) by mouth 2 (two) times daily. 180 tablet 3   No current facility-administered medications on file prior to visit.    Allergies  Allergen Reactions  . Aldactone [Spironolactone] Other (See Comments)    dyspnea  . Amoxicillin Palpitations    Tachycardia Has patient had a PCN reaction causing immediate rash, facial/tongue/throat swelling, SOB or lightheadedness with hypotension: no Has patient had a PCN reaction causing severe rash involving mucus membranes or skin necrosis: {no Has patient had a PCN reaction that required hospitalization {no Has patient had a PCN reaction occurring within the last 10 years: {yes If all of the above answers are "NO", then may proceed with Cephalosporin use.  . Diltiazem Other (See Comments)    Causing headaches   . Flagyl [Metronidazole Hcl] Other (See Comments)    Causing headaches    . Flovent [Fluticasone Propionate] Other (See Comments)    Leg cramps  . Gabapentin Swelling  . Lyrica [Pregabalin] Swelling  . Quinidine Diarrhea and Other (See Comments)    Fever diarrhea  . Simvastatin Other (See Comments)    Leg pain, myalgia  . Tramadol Nausea Only  . Verapamil Other (See Comments)    myalgias  . Amlodipine     Low extremity edema  . Crestor [Rosuvastatin]     myalgia  . Zetia [Ezetimibe]     LEG CRAMPS   . Ace Inhibitors Other (See Comments)    unknown  . Benazepril Hcl Cough  . Ciprocin-Fluocin-Procin [Fluocinolone Acetonide] Other (See Comments)    unknown  . Ciprofloxacin Diarrhea  . Codeine Nausea Only  . Nitrofurantoin Monohyd Macro Nausea Only    Dyslipidemia LDL remains above goal for secondary prevention. Patient reportes intolerance to simvastatin, low dose rosuvastatin, and ezetimibe. She is completley against using any injectables and unwilling to discuss Repatha/Praluent/Leqvio any further. Patient is willing to re-challenge with low dose rosuvastatin or any  other statin. We can try Nexletol as well.  Livalo 2mg  samples provided today (2 weeks). Will submit prior-authorization and application to [facility] in 2 week if patient is able to tolerate therapy. Want to check vitamin D level in case it may be worsening generalized muscle aches.   Tryson Lumley Rodriguez-Guzman PharmD, BCPS, [place] [id number] 01/23/2021 2:44 PM

## 2021-01-20 NOTE — Patient Instructions (Addendum)
Your Results:             Your most recent labs Goal  Total Cholesterol 153 < 200  Triglycerides 126 < 150  HDL (good cholesterol) 55 > 40  LDL (bad cholesterol) 77 < 70     Medication changes: *START trail with Livalo 2mg  1 tablet every day (samples provided)  Lab orders: *Viatmin D level today *Fasting lipid 8 weeks after star new medication*  Clinic phone number: [phone] (Lanny Lipkin/[place])  Thank you for choosing [facility]

## 2021-01-20 NOTE — Chronic Care Management (AMB) (Signed)
Chronic Care Management Pharmacy Assistant   Name: Catherine [name]  MRN: [id number] DOB: [birth year]  Reason for Encounter: Disease State/ Hypertension.   Conditions to be addressed/monitored: HTN  Recent office visits:  01/12/2021 Catherine Post MD (PCP) - seen for cellulitis of left wrist, hypertension, A-FIB and COPD. Patient started on Fluconazole 150mg  once for one dose. Changed cephalexin 250 mg caps once capsule daily to Cephalexin 500mg  orally four times daily for 10 days. Patient instructed to consider starting a probiotic and to follow up for any fever, progressive redness or other concerns.  Recent consult visits:   01/20/2021 Catherine [name] CPP ( cardiology)  - added pitavastatin calcium 2mg  orally daily. Discontinued rosuvastatin calcium 5mg . Vitamin D and fasting lipids 8 weeks after starting livalo ordered. No follow up noted at this time.   Hospital visits:  None in previous 6 months  Medications: Outpatient Encounter Medications as of 01/20/2021  Medication Sig  . acetaminophen (TYLENOL) 500 MG tablet Take 500 mg by mouth daily.  Marland Kitchen albuterol (VENTOLIN HFA) 108 (90 Base) MCG/ACT inhaler Inhale 2 puffs into the lungs every 4 (four) hours as needed for wheezing or shortness of breath.  Marland Kitchen aspirin EC 81 MG EC tablet Take 1 tablet (81 mg total) by mouth daily.  . budesonide-formoterol (SYMBICORT) 80-4.5 MCG/ACT inhaler Inhale 2 puffs into the lungs 2 (two) times daily. (Patient taking differently: Inhale 1 puff into the lungs 2 (two) times daily.)  . cephALEXin (KEFLEX) 250 MG capsule TAKE (1) CAPSULE DAILY  . cephALEXin (KEFLEX) 500 MG capsule Take 1 capsule (500 mg total) by mouth 4 (four) times daily.  . diazepam (VALIUM) 5 MG tablet TAKE ONE TABLET AT BEDTIME  . doxazosin (CARDURA) 8 MG tablet Take 1 tablet (8 mg total) by mouth daily.  . metoprolol succinate (TOPROL-XL) 25 MG 24 hr tablet TAKE 1 TABLET DAILY  . Multiple Vitamins-Minerals  (HAIR/SKIN/NAILS/BIOTIN PO) Take 1 tablet by mouth daily.  . Pitavastatin Calcium 2 MG TABS Take 1 tablet (2 mg total) by mouth daily.  . potassium chloride (KLOR-CON) 10 MEQ tablet TAKE 8 TABLETS DAILY  . torsemide (DEMADEX) 20 MG tablet Take 2 tablets (40 mg total) by mouth 2 (two) times daily.  Marland Kitchen warfarin (COUMADIN) 2 MG tablet Take 1 tablet daily or take as directed by anticoagulation clinic   No facility-administered encounter medications on file as of 01/20/2021.   Reviewed chart prior to disease state call. Spoke with patient regarding BP  Recent Office Vitals: BP Readings from Last 3 Encounters:  01/20/21 134/78  01/12/21 132/70  11/27/20 122/78   Pulse Readings from Last 3 Encounters:  01/20/21 (!) 58  01/12/21 68  11/27/20 (!) 52    Wt Readings from Last 3 Encounters:  01/20/21 155 lb 3.2 oz (70.4 kg)  01/12/21 154 lb 3.2 oz (69.9 kg)  11/27/20 155 lb 9.6 oz (70.6 kg)     Kidney Function Lab Results  Component Value Date/Time   CREATININE 0.92 11/27/2020 02:39 PM   CREATININE 0.91 10/29/2020 12:10 PM   CREATININE 0.94 (H) 07/29/2020 09:50 AM   CREATININE 0.71 07/09/2020 02:03 PM   GFR 67.66 10/01/2019 03:03 PM   GFRNONAA 59 (L) 10/29/2020 12:10 PM   GFRAA 67 10/29/2020 12:10 PM    BMP Latest Ref Rng & Units 11/27/2020 10/29/2020 07/29/2020  Glucose 65 - 99 mg/dL 89 120(H) 98  BUN 8 - 27 mg/dL 14 16 17   Creatinine 0.57 - 1.00 mg/dL 0.92 0.91 0.94(H)  BUN/Creat Ratio 12 - 28 15 18 18   Sodium 134 - 144 mmol/L 147(H) 144 143  Potassium 3.5 - 5.2 mmol/L 3.5 3.5 3.8  Chloride 96 - 106 mmol/L 104 101 104  CO2 20 - 29 mmol/L 23 26 32  Calcium 8.7 - 10.3 mg/dL 9.4 9.3 9.6    . Current antihypertensive regimen:  o Metoprolol 25mg  - take once daily  . How often are you checking your Blood Pressure? weekly  . Current home BP readings: Patient reports her husband uses her monitor as well so she was not able to determine which numbers were hers or her husbands. She  stated that it was 134/76 on 01/21/21 at her visit with the clinical pharmacist at [facility].  . What recent interventions/DTPs have been made by any provider to improve Blood Pressure control since last CPP Visit: none.  . Any recent hospitalizations or ED visits since last visit with CPP? No  . What diet changes have been made to improve Blood Pressure Control?  o Patient has lowered sodium intake and if she eats canned vegetables she rinses them first before eating. She does not add salt to anything.  . What exercise is being done to improve your Blood Pressure Control?  o Patient states she currently attends silver sneakers twice weekly on mondays and wednesdays. She also states that she works around her house cleaning and takes out the trash as well as walking to [employer].  Adherence Review: Is the patient currently on ACE/ARB medication? No Does the patient have >5 day gap between last estimated fill dates? No   Catherine [name], CMA   Star Rating Drugs:  pitavastatin 2mg  - patient was given samples on 01/21/21 from Clinical pharmacist at [facility].   [name] [phone]

## 2021-01-21 LAB — VITAMIN D 25 HYDROXY (VIT D DEFICIENCY, FRACTURES): Vit D, 25-Hydroxy: 25 ng/mL — ABNORMAL LOW (ref 30.0–100.0)

## 2021-01-23 ENCOUNTER — Other Ambulatory Visit: Payer: Self-pay | Admitting: *Deleted

## 2021-01-23 ENCOUNTER — Encounter: Payer: Self-pay | Admitting: Pharmacist

## 2021-01-23 DIAGNOSIS — E559 Vitamin D deficiency, unspecified: Secondary | ICD-10-CM

## 2021-01-23 MED ORDER — VITAMIN D 25 MCG (1000 UNIT) PO TABS
1000.0000 [IU] | ORAL_TABLET | Freq: Every day | ORAL | Status: DC
Start: 2021-01-23 — End: 2021-12-15

## 2021-01-23 NOTE — Assessment & Plan Note (Signed)
LDL remains above goal for secondary prevention. Patient reportes intolerance to simvastatin, low dose rosuvastatin, and ezetimibe. She is completley against using any injectables and unwilling to discuss Repatha/Praluent/Leqvio any further. Patient is willing to re-challenge with low dose rosuvastatin or any other statin. We can try Nexletol as well.  Livalo 2mg  samples provided today (2 weeks). Will submit prior-authorization and application to [facility] in 2 week if patient is able to tolerate therapy. Want to check vitamin D level in case it may be worsening generalized muscle aches.

## 2021-02-02 ENCOUNTER — Other Ambulatory Visit: Payer: Self-pay | Admitting: Family Medicine

## 2021-02-02 ENCOUNTER — Telehealth: Payer: Self-pay

## 2021-02-02 DIAGNOSIS — I4891 Unspecified atrial fibrillation: Secondary | ICD-10-CM

## 2021-02-02 DIAGNOSIS — I4821 Permanent atrial fibrillation: Secondary | ICD-10-CM

## 2021-02-02 DIAGNOSIS — E785 Hyperlipidemia, unspecified: Secondary | ICD-10-CM

## 2021-02-02 NOTE — Telephone Encounter (Signed)
Called and spoke w/pt's husband and stated tht we needed to speak w/pt regarding the livalo and he stated that he would have her give [name] a call back

## 2021-02-02 NOTE — Telephone Encounter (Signed)
-----   Message from Harrington Challenger, RPH-CPP sent at 02/02/2021  9:00 AM EDT ----- Regarding: FW: Livalo Please call patient. Check if taking Livalo samples provided. We can enter PA for insurance if needed.    thanks ----- Message ----- From: Harrington Challenger, RPH-CPP Sent: 02/02/2021  12:00 AM EDT To: Harrington Challenger, RPH-CPP Subject: Livalo                                         Taking Livalo every day? Submit PA and/or [employer] application.

## 2021-02-03 ENCOUNTER — Telehealth: Payer: Self-pay | Admitting: Family Medicine

## 2021-02-03 MED ORDER — ROSUVASTATIN CALCIUM 5 MG PO TABS: ORAL_TABLET | ORAL | 11 refills | Status: DC

## 2021-02-03 MED ORDER — CEPHALEXIN 250 MG PO CAPS: ORAL_CAPSULE | ORAL | 0 refills | Status: DC

## 2021-02-03 NOTE — Telephone Encounter (Signed)
Patient is calling and requesting a refill for cephALEXin (KEFLEX) 250 MG capsule to be sent to    [facility], [place] [address]  Phone:  [phone] Fax:  [phone]  CB is [phone]

## 2021-02-03 NOTE — Telephone Encounter (Signed)
Pt stated that they had more pain with livalo than with crestor so she stopped therapy and believes she can handle crestor better. Pt instructed to start crestor 5mg  m,w,f per raquel pharmd and was advised to get fasting lipid and hepatic 6 wk out. Pt voiced understanding. Labs mailed to pt to complete

## 2021-02-03 NOTE — Telephone Encounter (Signed)
Refill OK.  She is taking this as prophylaxis for UTI.   

## 2021-02-03 NOTE — Telephone Encounter (Signed)
Rx has been sent in. Nothing further needed. 

## 2021-02-03 NOTE — Telephone Encounter (Signed)
Please advise. Should the patient continue this medication? 

## 2021-02-03 NOTE — Addendum Note (Signed)
Addended by: Allean Found on: 02/03/2021 10:45 AM   Modules accepted: Orders

## 2021-02-09 ENCOUNTER — Ambulatory Visit (INDEPENDENT_AMBULATORY_CARE_PROVIDER_SITE_OTHER): Payer: Medicare Other | Admitting: General Practice

## 2021-02-09 ENCOUNTER — Other Ambulatory Visit: Payer: Self-pay

## 2021-02-09 DIAGNOSIS — I4891 Unspecified atrial fibrillation: Secondary | ICD-10-CM

## 2021-02-09 LAB — POCT INR: INR: 2.3 (ref 2.0–3.0)

## 2021-02-09 NOTE — Patient Instructions (Addendum)
Pre visit review using our clinic review tool, if applicable. No additional management support is needed unless otherwise documented below in the visit note.  Continue to take 1 tablet daily except 1/2 tablet on Wednesday and Saturday.  Re-check in 6 weeks.

## 2021-02-12 ENCOUNTER — Telehealth: Payer: Self-pay | Admitting: Family Medicine

## 2021-02-12 NOTE — Telephone Encounter (Signed)
Left message for patient to call back and schedule Medicare Annual Wellness Visit (AWV) either virtually or in office.   AWV-I per [place] 09/27/09 please schedule at anytime with [facility] Nurse Health Advisor 1 or 2   This should be a 45 minute visit. 

## 2021-02-12 NOTE — Telephone Encounter (Signed)
Pt states she does not want to schedule at this present time she would like to put it off at the end of the year because she has so many appointments

## 2021-02-13 ENCOUNTER — Other Ambulatory Visit: Payer: Self-pay | Admitting: Family Medicine

## 2021-02-13 DIAGNOSIS — Z1231 Encounter for screening mammogram for malignant neoplasm of breast: Secondary | ICD-10-CM

## 2021-02-21 NOTE — Progress Notes (Addendum)
Cardiology Office Note:    Date:  02/24/2021   ID:  Catherine [name], DOB 07-17-1935, MRN [id number]  PCP:  [name], MD  Cardiologist:  [name], MD  Electrophysiologist:  None   Referring MD: [name], MD   Chief Complaint: follow-up of CAD, diastolic CHF, MVR, and chronic atrial fibrillation  History of Present Illness:    Catherine [name] is a 86 y.o. 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 Oval Linsey and presents today for routine follow-up.   Patient previously followed by [name] and now follows with Dr. Oval Linsey. She has a history of rheumatic mitral valve disease and first underwent mitral commissurotomy in the 1970s. She has had routine Echos throughout the years for monitoring of valvular disease. Echo in 06/2016 showed LVEF of 60-65% with moderate to severe MR, She ulitimately underwent right/left heart cath in 07/2016 which showed 80% sequential lesion in the mid RCA with moderate non-obstructive disease elsewhere as well as severe mitral stenosis and severe mitral insufficiency with mild pulmonary hypertension. CT surgery was consulted and patient underwent single vessel CABG (SVG to distal RCA), bioprosthetic mitral valve replacement, and bilateral MAZE procedure on 10/14/2016 with Dr. Roxy Manns.   Most recent Echo in 09/2019 showed LVEF of 65-70% with mild LVH but no regional wall motion abnormalities. Bioprosthetic mitral valve was functioning normally with no evidence of MR/MS. Mild aortic stenosis and mild pulmonic regurgitation also noted.  Since CABG/MVR she has continued to report  fatigue. It was initially unclear whether she was in atrial fibrillation or sinus rhythm with a long PR interval. She was referred to the [facility] where this remained unclear. However, it was felt that if she were in atrial fibrillation it would not be possible to maintain sinus rhythm. She has also struggled with some lower extremity edema/fluid retention after surgery.  Patient was last seen by Dr. Oval Linsey in 11/2020 at which time she reported that her abdominal bloating improved after switching from Lasix to Torsemide but she was still having lower extremity edema (right > left). She also continued to have a lot of fatigue. It was felt that lower extremity edema was likely to chronic venous insufficiency. She has been unable to tolerate compression stockings in the past. Referral to Vein and Vascular was offered but patient wanted to wait.  Patient presents today for follow-up. Here alone. Patient continues to report fatigue since having COVID in 2020 but this is unchanged from prior visit. Overall, seems to be stable from a cardiac standpoint. Denies any chest pain. She has chronic shortness of breath, mostly with activity, that is well controlled with inhalers. She follows with Pulmonology. No orthopnea or PND. She has chronic lower extremity edema that is worse at the end of the day and improves with elevation of her legs. This is stable. She notes occasional palpitations where her heart will be fast but cannot tell me how often this occurs. She notes some dizziness with quick position changes. No syncope. Patient also report "funny sensation" that will last for 1/2 second over the last month. She initially had difficult describing this sensation but then reported she would feel a  little dizzy and near syncope with it. However, she again states it would only last for about 1/2 second. She states she was having this sensation a lot last week but it has been much better the last few days.   Past  Medical History:  Diagnosis Date  . Acute on chronic diastolic heart failure ([place])   . Aortic stenosis, mild 07/26/2017  . Arthritis   . Asthma    last attack 02/2015  . Atrial fibrillation, chronic ([place])   . Cellulitis of left lower extremity 08/17/2016   Ulcer associated with severe venous insufficiency  . Chronic anticoagulation   . Chronic diastolic CHF (congestive heart failure) ([place])   . Chronic kidney disease    "RIGHT MANY KIDNEY INFECTIONS AND STONES"  . COPD (chronic obstructive pulmonary disease) ([place])   . Coronary artery disease   . Dizziness   . H/O: rheumatic fever   . Heart murmur   . Hypertension   . PONV (postoperative nausea and vomiting)    ' SOMETIMES', BUT NOT ALWAYS"  . S/P Maze operation for atrial fibrillation 10/14/2016   Complete bilateral atrial lesion set using cryothermy and bipolar radiofrequency ablation - atrial appendage was not treated due to previous surgical procedure (open mitral commissurotomy)  . S/P mitral valve replacement with bioprosthetic valve 10/14/2016   29 mm Medtronic Mosaic porcine bioprosthetic tissue valve  . UTI (urinary tract infection)   . Valvular heart disease    Has mitral stenosis with prior mitral commissurotomy in 1970    Past Surgical History:  Procedure Laterality Date  . ABDOMINAL HYSTERECTOMY  1983   endometriosis  . APPENDECTOMY    . BACK SURGERY     neurosurgery x2  . CARDIAC CATHETERIZATION    . CARDIAC CATHETERIZATION N/A 08/03/2016   Procedure: Right/Left Heart Cath and Coronary Angiography;  Surgeon: Peter M Martinique, MD;  Location: [facility];  Service: Cardiovascular;  Laterality: N/A;  . cataract surg    . CHOLECYSTECTOMY N/A 05/30/2015   Procedure: LAPAROSCOPIC CHOLECYSTECTOMY WITH INTRAOPERATIVE CHOLANGIOGRAM;  Surgeon: Excell Seltzer, MD;  Location: [place];  Service: General;  Laterality: N/A;  . COLONOSCOPY    . CORONARY ARTERY BYPASS GRAFT N/A 10/14/2016   Procedure: CORONARY ARTERY BYPASS  GRAFTING (CABG);  Surgeon: Rexene Alberts, MD;  Location: [facility];  Service: Open Heart Surgery;  Laterality: N/A;  . EYE SURGERY    . MAZE N/A 10/14/2016   Procedure: MAZE;  Surgeon: Rexene Alberts, MD;  Location: [place];  Service: Open Heart Surgery;  Laterality: N/A;  . MITRAL VALVE REPLACEMENT N/A 10/14/2016   Procedure: REDO MITRAL VALVE REPLACEMENT (MVR);  Surgeon: Rexene Alberts, MD;  Location: [place];  Service: Open Heart Surgery;  Laterality: N/A;  . MITRAL VALVE SURGERY Left 1970   Open mitral commissurotomy via left thoracotomy approach  . TEE WITHOUT CARDIOVERSION N/A 07/05/2016   Procedure: TRANSESOPHAGEAL ECHOCARDIOGRAM (TEE);  Surgeon: [name], MD;  Location: [place];  Service: Cardiovascular;  Laterality: N/A;  . TEE WITHOUT CARDIOVERSION N/A 10/14/2016   Procedure: TRANSESOPHAGEAL ECHOCARDIOGRAM (TEE);  Surgeon: Rexene Alberts, MD;  Location: [place];  Service: Open Heart Surgery;  Laterality: N/A;    Current Medications: Current Meds  Medication Sig  . acetaminophen (TYLENOL) 500 MG tablet Take 500 mg by mouth daily.  Marland Kitchen albuterol (VENTOLIN HFA) 108 (90 Base) MCG/ACT inhaler Inhale 2 puffs into the lungs every 4 (four) hours as needed for wheezing or shortness of breath.  Marland Kitchen  aspirin EC 81 MG EC tablet Take 1 tablet (81 mg total) by mouth daily.  . budesonide-formoterol (SYMBICORT) 80-4.5 MCG/ACT inhaler Inhale 2 puffs into the lungs 2 (two) times daily. (Patient taking differently: Inhale 1 puff into the lungs 2 (two) times daily.)  . cephALEXin (KEFLEX) 250 MG capsule Take 1 capsule (250 mg total) by mouth 2 (two) times daily.  . cholecalciferol (VITAMIN D3) 25 MCG (1000 UNIT) tablet Take 1 tablet (1,000 Units total) by mouth daily.  . diazepam (VALIUM) 5 MG tablet TAKE ONE TABLET AT BEDTIME  . doxazosin (CARDURA) 8 MG tablet Take 1 tablet (8 mg total) by mouth daily.  . metoprolol succinate (TOPROL-XL) 25 MG 24 hr tablet TAKE 1 TABLET DAILY  . Multiple  Vitamins-Minerals (HAIR/SKIN/NAILS/BIOTIN PO) Take 1 tablet by mouth daily.  . potassium chloride (KLOR-CON) 10 MEQ tablet TAKE 8 TABLETS DAILY  . rosuvastatin (CRESTOR) 5 MG tablet Take one tablet every Monday, wednesday Friday  . warfarin (COUMADIN) 2 MG tablet Take 1 tablet daily or take as directed by anticoagulation clinic  . [DISCONTINUED] cephALEXin (KEFLEX) 250 MG capsule TAKE (1) CAPSULE DAILY  . [DISCONTINUED] cephALEXin (KEFLEX) 500 MG capsule Take 1 capsule (500 mg total) by mouth 4 (four) times daily.     Allergies:   Aldactone [spironolactone], Amoxicillin, Diltiazem, Flagyl [metronidazole hcl], Flovent [fluticasone propionate], Gabapentin, Lyrica [pregabalin], Quinidine, Simvastatin, Tramadol, Verapamil, Amlodipine, Crestor [rosuvastatin], Livalo [pitavastatin], Zetia [ezetimibe], Ace inhibitors, Benazepril hcl, Ciprocin-fluocin-procin [fluocinolone acetonide], Ciprofloxacin, Codeine, and Nitrofurantoin monohyd macro   Social History   Socioeconomic History  . Marital status: Married    Spouse name: Not on file  . Number of children: Not on file  . Years of education: Not on file  . Highest education level: Not on file  Occupational History  . Not on file  Tobacco Use  . Smoking status: Former Smoker    Packs/day: 1.00    Years: 15.00    Pack years: 15.00    Types: Cigarettes    Quit date: 09/28/1975    Years since quitting: 45.4  . Smokeless tobacco: Never Used  Vaping Use  . Vaping Use: Never used  Substance and Sexual Activity  . Alcohol use: No    Alcohol/week: 0.0 standard drinks  . Drug use: No  . Sexual activity: Yes  Other Topics Concern  . Not on file  Social History Narrative  . Not on file   Social Determinants of Health   Financial Resource Strain: High Risk  . Difficulty of Paying Living Expenses: Hard  Food Insecurity: Not on file  Transportation Needs: No Transportation Needs  . Lack of Transportation (Medical): No  . Lack of Transportation  (Non-Medical): No  Physical Activity: Not on file  Stress: Not on file  Social Connections: Not on file     Family History: The patient's family history includes Leukemia in her father. There is no history of Breast cancer.  ROS:   Please see the history of present illness.     EKGs/Labs/Other Studies Reviewed:    The following studies were reviewed today:  Right/Left Cardiac Catheterization 08/03/2016:  The left ventricular systolic function is normal.  LV end diastolic pressure is normal.  The left ventricular ejection fraction is 55-65% by visual estimate.  There is no aortic valve stenosis.  There is severe mitral valve stenosis.  Prox Cx to Mid Cx lesion, 50 %stenosed.  Mid RCA-2 lesion, 80 %stenosed.  Mid RCA-1 lesion, 80 %stenosed.  Hemodynamic findings consistent with mild  pulmonary hypertension.  LV end diastolic pressure is normal.   1. Coronary artery disease   - 50% mid LCx   - 80% sequential lesions in the mid RCA 2. Normal LV function 3. Severe mitral stenosis. MV gradient of 13 mm Hg. MVA 0.99 cm squared with index 0.57. 4. Moderate to severe mitral insufficiency 5. Mild pulmonary HTN. 6. Normal LV EDP 7. No significant AV gradient 8. Occluded right brachial artery.  Plan: surgical evaluation for MVR and CABG. _______________  Echocardiogram 10/15/2019: Impressions: 1. Left ventricular ejection fraction, by visual estimation, is 65 to  70%. The left ventricle has hyperdynamic function. Left ventricular septal  wall thickness was mildly increased. There is mildly increased left  ventricular hypertrophy.  2. The left ventricle has no regional wall motion abnormalities.  3. Global right ventricle has normal systolic function.The right  ventricular size is normal. No increase in right ventricular wall  thickness.  4. Left atrial size was moderately dilated.  5. Right atrial size was moderately dilated.  6. The mitral valve has been  repaired/replaced. No evidence of mitral  valve regurgitation. No evidence of mitral stenosis.  7. Patient has a 29 mm biprosthetic valve functioning normally with no  PVL The mitral struts protrude into the LVOT quite a bit likely  contributing to some of the gradient across the LVOT/AV. Creates a small  "neointimal LVOT" area.  8. The tricuspid valve is normal in structure.  9. The aortic valve is tricuspid. Aortic valve regurgitation is not  visualized. Mild aortic valve stenosis.  10. Pulmonic regurgitation is mild.  11. The pulmonic valve was grossly normal. Pulmonic valve regurgitation is  mild.  12. Mildly elevated pulmonary artery systolic pressure.    EKG:  EKG not ordered today.   Recent Labs: 07/09/2020: ALT 13; Pro B Natriuretic peptide (BNP) 253.0; TSH 3.95 11/27/2020: BNP 129.8; BUN 14; Creatinine, Ser 0.92; Hemoglobin 13.1; Magnesium 2.2; Platelets 173; Potassium 3.5; Sodium 147  Recent Lipid Panel    Component Value Date/Time   CHOL 153 07/09/2020 1403   CHOL 222 (H) 06/06/2019 1116   TRIG 126 07/09/2020 1403   HDL 55 07/09/2020 1403   HDL 53 06/06/2019 1116   CHOLHDL 2.8 07/09/2020 1403   VLDL 42.8 (H) 09/24/2019 1054   LDLCALC 77 07/09/2020 1403   LDLDIRECT 68.0 09/24/2019 1054    Physical Exam:    Vital Signs: BP 135/62   Pulse (!) 56   Ht 5\' 3"  (1.6 m)   Wt 150 lb (68 kg)   SpO2 94%   BMI 26.57 kg/m     Wt Readings from Last 3 Encounters:  02/24/21 150 lb (68 kg)  01/20/21 155 lb 3.2 oz (70.4 kg)  01/12/21 154 lb 3.2 oz (69.9 kg)     General: 86 y.o. Caucasian female in no acute distress. HEENT: Normocephalic and atraumatic. Sclera clear. Neck: Supple. Bilateral carotid bruits worse on the right (maybe radiation from cardiac murmur). No JVD. Heart: Irregular rhythm with normal rate.  Distinct S1 and S2. III/VI systolic murmur heard best at upper sternal border. Radial pulses 2+ and equal bilaterally. Lungs: No increased work of breathing.  Clear to ausculation bilaterally. No wheezes, rhonchi, or rales.  Abdomen: Soft, non-distended, and non-tender to palpation. Bowel sounds present. Extremities: Trace to 1+ pitting edema of bilateral loewr extremities.    Skin: Warm and dry. Neuro: Alert and oriented x3. No focal deficits. Psych: Normal affect. Responds appropriately.   Assessment:    1. Coronary  artery disease involving native coronary artery of native heart without angina pectoris   2. S/P CABG (coronary artery bypass graft)   3. Rheumatic mitral valve disease   4. S/P mitral valve replacement with bioprosthetic valve   5. Chronic atrial fibrillation (HCC)   6. Chronic diastolic CHF (congestive heart failure) ([place])   7. Aortic valve stenosis, etiology of cardiac valve disease unspecified   8. Primary hypertension   9. Hyperlipidemia, unspecified hyperlipidemia type   10. Bruit     Plan:    CAD s/p CABG - S/p single vessel CABG (SVG to distal RCA) in 09/2016 at time of MVR. - No chest pain.  - Currently on low dose Aspirin in addition to Coumadin. Patient is wondering if she needs to be on both. I think she can stop Aspirin at this point but will confirm with Dr. Oval Linsey as she has been on Aspirin since the time of her surgery. - Continue beta-blocker and statin.  Rheumatic Mitral Valvular Disease s/p Mitral Valve Replacement - S/p bioprosthetic mitral valve replacement in 09/2016. - Most recent Echo in 09/2019 showed normal functioning valve with no evidence of MR/MS. - Continue SBE prophylaxis for dental procedures.  Chronic Atrial Fibrillation - S/p MAZE procedure in 2018 at time of CABG/MVR. - Rate controlled. - Continue Toprol-XL 25mg  daily.  - Continue chronic anticoagulation with Coumadin.   Chronic Diastolic CHF - Echo in 09/9145 showed LVEF of 65-70% with mild LVH.  - Chronic lower extremity on exam but otherwise appears euvolemic. - Continue Torsemide 40mg  twice daily.  - Discussed importance of  sodium restriction. - Recommended compressions stockings for lower extremity edema.  Aortic Stenosis - Mild AS noted on Echo in 09/2019. - Consider repeat Echo in 09/2022 for routine monitoring (or sooner if patient has worsening symptoms).  Carotid Bruits - Bilateral bruits noted on exam (right > left).  - May be radiation from AS murmur. However, patient has not every had carotid dopplers. Will order today.  Mild Dizziness - Patient reports "funny sensation" that will last for 1/2 second over the last month. She initially had difficulty describing this sensation but then reported she would feel a little dizzy and near syncope with it. However, she again states it would only last for about 1/2 second at a time. She states she was having this sensation a lot last week but it has been much better the last few days. Also described mild dizziness with quick position changes.  - Given symptoms are very mild and only last for a very brief time, don't think any additional work-up is needed right now. However, advised patient to let us know fi symptoms worsen or episodes last longer.  Hypertension - BP well controlled. - Continue Toprol-XL 25mg  daily and Cardura 8mg  daily. Patient has multiple medication intolerances.   Hyperlipidemia - Last lipid panel from 06/2020: Total Cholesterol 153, Triglycerides 126, HDL 55, LDL 77. - Slightly above LDL goal of <70 given CAD. - Patient has been intolerant to multiple statins in the past as well as Zetia and has declined PCSK9 inhibitors.  - Patient was seen in our PharmD lipid clinic in 12/2020 and agreed to retry low dose Crestor. She was also started on Livalo but was not able to tolerate this so stopped. - Continue Crestor 5mg  M/W/F.  - Plan is to repeat lipid panel and LFTs in June/July 2022. Labs already ordered.  Of note, patient report"funny sensation" that will last for 1/2 second over the last month.  She initially had difficult describing this  sensation but then reported she would feel a little dizzy and near syncope with it. However, she again states it would only last for about 1/2 second. She states she was having this sensation a lot last week but it has been much better the last few days. Advised patient to let us know if this worsens or episodes last longer.  Disposition: Follow up in 6 months with Dr. Oval Linsey  Addendum 02/26/2021 8:14AM: Discussed with Dr. Oval Linsey - OK to stop Aspirin given patient is on Coumadin. Will update medication list and notify patient.    Medication Adjustments/Labs and Tests Ordered: Current medicines are reviewed at length with the patient today.  Concerns regarding medicines are outlined above.  Orders Placed This Encounter  Procedures  . VAS US CAROTID   Meds ordered this encounter  Medications  . cephALEXin (KEFLEX) 250 MG capsule    Sig: Take 1 capsule (250 mg total) by mouth 2 (two) times daily.    Patient Instructions  Medication Instructions:  Your physician recommends that you continue on your current medications as directed. Please refer to the Current Medication list given to you today.  *If you need a refill on your cardiac medications before your next appointment, please call your pharmacy*   Lab Work: None ordered.   Testing/Procedures: Your physician has requested that you have a carotid duplex. This test is an ultrasound of the carotid arteries in your neck. It looks at blood flow through these arteries that supply the brain with blood. Allow one hour for this exam. There are no restrictions or special instructions.    Follow-Up: At [facility], you and your health needs are our priority.  As part of our continuing mission to provide you with exceptional heart care, we have created designated Provider Care Teams.  These Care Teams include your primary Cardiologist (physician) and Advanced Practice Providers (APPs -  Physician Assistants and Nurse Practitioners) who  all work together to provide you with the care you need, when you need it.  We recommend signing up for the patient portal called "MyChart".  Sign up information is provided on this After Visit Summary.  MyChart is used to connect with patients for Virtual Visits (Telemedicine).  Patients are able to view lab/test results, encounter notes, upcoming appointments, etc.  Non-urgent messages can be sent to your provider as well.   To learn more about what you can do with MyChart, go to [email or web address].    Your next appointment:   6 month(s)  The format for your next appointment:   In Person  Provider:   [name], MD        Signed, Darreld Mclean, PA-C  02/24/2021 2:44 PM    [place]

## 2021-02-24 ENCOUNTER — Other Ambulatory Visit: Payer: Self-pay

## 2021-02-24 ENCOUNTER — Ambulatory Visit: Payer: Medicare Other | Admitting: Student

## 2021-02-24 ENCOUNTER — Encounter: Payer: Self-pay | Admitting: Student

## 2021-02-24 VITALS — BP 135/62 | HR 56 | Ht 63.0 in | Wt 150.0 lb

## 2021-02-24 DIAGNOSIS — Z953 Presence of xenogenic heart valve: Secondary | ICD-10-CM | POA: Diagnosis not present

## 2021-02-24 DIAGNOSIS — I059 Rheumatic mitral valve disease, unspecified: Secondary | ICD-10-CM | POA: Diagnosis not present

## 2021-02-24 DIAGNOSIS — I10 Essential (primary) hypertension: Secondary | ICD-10-CM | POA: Diagnosis not present

## 2021-02-24 DIAGNOSIS — I350 Nonrheumatic aortic (valve) stenosis: Secondary | ICD-10-CM

## 2021-02-24 DIAGNOSIS — E785 Hyperlipidemia, unspecified: Secondary | ICD-10-CM

## 2021-02-24 DIAGNOSIS — R42 Dizziness and giddiness: Secondary | ICD-10-CM

## 2021-02-24 DIAGNOSIS — I5032 Chronic diastolic (congestive) heart failure: Secondary | ICD-10-CM | POA: Diagnosis not present

## 2021-02-24 DIAGNOSIS — I482 Chronic atrial fibrillation, unspecified: Secondary | ICD-10-CM | POA: Diagnosis not present

## 2021-02-24 DIAGNOSIS — R0989 Other specified symptoms and signs involving the circulatory and respiratory systems: Secondary | ICD-10-CM

## 2021-02-24 DIAGNOSIS — I2510 Atherosclerotic heart disease of native coronary artery without angina pectoris: Secondary | ICD-10-CM

## 2021-02-24 DIAGNOSIS — Z951 Presence of aortocoronary bypass graft: Secondary | ICD-10-CM | POA: Diagnosis not present

## 2021-02-24 MED ORDER — CEPHALEXIN 250 MG PO CAPS: 250.0000 mg | ORAL_CAPSULE | Freq: Two times a day (BID) | ORAL | Status: DC

## 2021-02-24 NOTE — Patient Instructions (Signed)
Medication Instructions:  Your physician recommends that you continue on your current medications as directed. Please refer to the Current Medication list given to you today.  *If you need a refill on your cardiac medications before your next appointment, please call your pharmacy*   Lab Work: None ordered.   Testing/Procedures: Your physician has requested that you have a carotid duplex. This test is an ultrasound of the carotid arteries in your neck. It looks at blood flow through these arteries that supply the brain with blood. Allow one hour for this exam. There are no restrictions or special instructions.    Follow-Up: At [facility], you and your health needs are our priority.  As part of our continuing mission to provide you with exceptional heart care, we have created designated Provider Care Teams.  These Care Teams include your primary Cardiologist (physician) and Advanced Practice Providers (APPs -  Physician Assistants and Nurse Practitioners) who all work together to provide you with the care you need, when you need it.  We recommend signing up for the patient portal called "MyChart".  Sign up information is provided on this After Visit Summary.  MyChart is used to connect with patients for Virtual Visits (Telemedicine).  Patients are able to view lab/test results, encounter notes, upcoming appointments, etc.  Non-urgent messages can be sent to your provider as well.   To learn more about what you can do with MyChart, go to [email or web address].    Your next appointment:   6 month(s)  The format for your next appointment:   In Person  Provider:   [name], MD

## 2021-02-26 ENCOUNTER — Telehealth: Payer: Self-pay

## 2021-02-26 NOTE — Telephone Encounter (Signed)
-----   Message from Darreld Mclean, [place] sent at 02/26/2021  8:16 AM EDT ----- Regarding: Medication Update Hi Pammy Vesey,  I saw this patient earlier this week. During our visit, patient was wondering whether she needed to take both Aspirin and Coumadin. I told her Aspirin can likely be stopped but wanted to confirm with Dr. Oval Linsey. I spoke with Dr. Oval Linsey who agreed her Aspirin can be stopped. I have already updated her medication list but can you please call and notify patient of this?  Thank you so much! Callie

## 2021-02-26 NOTE — Addendum Note (Signed)
Addended by: Sande Rives on: 02/26/2021 08:16 AM   Modules accepted: Orders

## 2021-02-26 NOTE — Telephone Encounter (Signed)
Pt informed of providers result & recommendations. Pt verbalized understanding. No further questions . ? ?

## 2021-03-02 ENCOUNTER — Telehealth: Payer: Self-pay | Admitting: Cardiovascular Disease

## 2021-03-02 NOTE — Telephone Encounter (Signed)
Did not need this encounter °

## 2021-03-09 ENCOUNTER — Other Ambulatory Visit: Payer: Self-pay | Admitting: Family Medicine

## 2021-03-09 ENCOUNTER — Other Ambulatory Visit: Payer: Self-pay | Admitting: Internal Medicine

## 2021-03-10 ENCOUNTER — Telehealth: Payer: Self-pay | Admitting: Internal Medicine

## 2021-03-10 ENCOUNTER — Encounter: Payer: Self-pay | Admitting: Family Medicine

## 2021-03-10 ENCOUNTER — Other Ambulatory Visit: Payer: Self-pay | Admitting: Internal Medicine

## 2021-03-10 ENCOUNTER — Other Ambulatory Visit: Payer: Self-pay

## 2021-03-10 ENCOUNTER — Ambulatory Visit (INDEPENDENT_AMBULATORY_CARE_PROVIDER_SITE_OTHER): Payer: Medicare Other | Admitting: Family Medicine

## 2021-03-10 VITALS — BP 142/70 | HR 72 | Temp 98.2°F | Wt 153.0 lb

## 2021-03-10 DIAGNOSIS — L03113 Cellulitis of right upper limb: Secondary | ICD-10-CM

## 2021-03-10 MED ORDER — CEPHALEXIN 500 MG PO CAPS: 500.0000 mg | ORAL_CAPSULE | Freq: Three times a day (TID) | ORAL | 0 refills | Status: DC

## 2021-03-10 MED ORDER — SYMBICORT 80-4.5 MCG/ACT IN AERO: INHALATION_SPRAY | RESPIRATORY_TRACT | 3 refills | Status: DC

## 2021-03-10 MED ORDER — FLUCONAZOLE 150 MG PO TABS: 150.0000 mg | ORAL_TABLET | Freq: Once | ORAL | 0 refills | Status: AC

## 2021-03-10 NOTE — Progress Notes (Signed)
[facility]   Established Patient Office Visit  Subjective:  Patient ID: Catherine [name], female    DOB: 06/26/1935  Age: 86 y.o. MRN: [id number]  CC:  Chief Complaint  Patient presents with   Injury    R arm right above the wrist, hit it on the truck of the car, very painful, inflamed     HPI [name] presents for right arm injury.  She states this occurred last Friday.  She was getting groceries out of her SUV and the hatch lid came down onto her arm with small flap-like laceration.  Over the weekend she noticed some swelling and some progressive erythema which prompted her call to get worked in today.  No fevers or chills.  She seen little bit of drainage that she thought looked purulent.  She has been elevating this.  She is on chronic Coumadin therapy.  Past Medical History:  Diagnosis Date   Acute on chronic diastolic heart failure (HCC)    Aortic stenosis, mild 07/26/2017   Arthritis    Asthma    last attack 02/2015   Atrial fibrillation, chronic (HCC)    Cellulitis of left lower extremity 08/17/2016   Ulcer associated with severe venous insufficiency   Chronic anticoagulation    Chronic diastolic CHF (congestive heart failure) ([place])    Chronic kidney disease    "RIGHT MANY KIDNEY INFECTIONS AND STONES"   COPD (chronic obstructive pulmonary disease) (HCC)    Coronary artery disease    Dizziness    H/O: rheumatic fever    Heart murmur    Hypertension    PONV (postoperative nausea and vomiting)    ' SOMETIMES', BUT NOT ALWAYS"   S/P Maze operation for atrial fibrillation 10/14/2016   Complete bilateral atrial lesion set using cryothermy and bipolar radiofrequency ablation - atrial appendage was not treated due to previous surgical procedure (open mitral commissurotomy)   S/P mitral valve replacement with bioprosthetic valve 10/14/2016   29 mm Medtronic Mosaic porcine bioprosthetic tissue valve   UTI (urinary tract infection)    Valvular heart disease    Has mitral  stenosis with prior mitral commissurotomy in 1970    Past Surgical History:  Procedure Laterality Date   ABDOMINAL HYSTERECTOMY  1983   endometriosis   APPENDECTOMY     BACK SURGERY     neurosurgery x2   CARDIAC CATHETERIZATION     CARDIAC CATHETERIZATION N/A 08/03/2016   Procedure: Right/Left Heart Cath and Coronary Angiography;  Surgeon: Peter M Martinique, MD;  Location: [facility];  Service: Cardiovascular;  Laterality: N/A;   cataract surg     CHOLECYSTECTOMY N/A 05/30/2015   Procedure: LAPAROSCOPIC CHOLECYSTECTOMY WITH INTRAOPERATIVE CHOLANGIOGRAM;  Surgeon: Excell Seltzer, MD;  Location: [facility];  Service: General;  Laterality: N/A;   COLONOSCOPY     CORONARY ARTERY BYPASS GRAFT N/A 10/14/2016   Procedure: CORONARY ARTERY BYPASS GRAFTING (CABG);  Surgeon: Rexene Alberts, MD;  Location: [facility];  Service: Open Heart Surgery;  Laterality: N/A;   EYE SURGERY     MAZE N/A 10/14/2016   Procedure: MAZE;  Surgeon: Rexene Alberts, MD;  Location: [facility];  Service: Open Heart Surgery;  Laterality: N/A;   MITRAL VALVE REPLACEMENT N/A 10/14/2016   Procedure: REDO MITRAL VALVE REPLACEMENT (MVR);  Surgeon: Rexene Alberts, MD;  Location: [facility];  Service: Open Heart Surgery;  Laterality: N/A;   MITRAL VALVE SURGERY Left 1970   Open mitral commissurotomy via left thoracotomy approach  TEE WITHOUT CARDIOVERSION N/A 07/05/2016   Procedure: TRANSESOPHAGEAL ECHOCARDIOGRAM (TEE);  Surgeon: [name], MD;  Location: [facility];  Service: Cardiovascular;  Laterality: N/A;   TEE WITHOUT CARDIOVERSION N/A 10/14/2016   Procedure: TRANSESOPHAGEAL ECHOCARDIOGRAM (TEE);  Surgeon: Rexene Alberts, MD;  Location: [facility];  Service: Open Heart Surgery;  Laterality: N/A;    Family History  Problem Relation Age of Onset   Leukemia Father    Breast cancer Neg Hx     Social History   Socioeconomic History   Marital status: Married    Spouse name: Not on file   Number of children: Not on file    Years of education: Not on file   Highest education level: Not on file  Occupational History   Not on file  Tobacco Use   Smoking status: Former    Packs/day: 1.00    Years: 15.00    Pack years: 15.00    Types: Cigarettes    Quit date: 09/28/1975    Years since quitting: 45.4   Smokeless tobacco: Never  Vaping Use   Vaping Use: Never used  Substance and Sexual Activity   Alcohol use: No    Alcohol/week: 0.0 standard drinks   Drug use: No   Sexual activity: Yes  Other Topics Concern   Not on file  Social History Narrative   Not on file   Social Determinants of Health   Financial Resource Strain: High Risk   Difficulty of Paying Living Expenses: Hard  Food Insecurity: Not on file  Transportation Needs: No Transportation Needs   Lack of Transportation (Medical): No   Lack of Transportation (Non-Medical): No  Physical Activity: Not on file  Stress: Not on file  Social Connections: Not on file  Intimate Partner Violence: Not on file    Outpatient Medications Prior to Visit  Medication Sig Dispense Refill   acetaminophen (TYLENOL) 500 MG tablet Take 500 mg by mouth daily.     albuterol (VENTOLIN HFA) 108 (90 Base) MCG/ACT inhaler Inhale 2 puffs into the lungs every 4 (four) hours as needed for wheezing or shortness of breath. 1 each 2   cephALEXin (KEFLEX) 250 MG capsule TAKE ONE CAPSULE ONCE DAILY 30 capsule 3   cholecalciferol (VITAMIN D3) 25 MCG (1000 UNIT) tablet Take 1 tablet (1,000 Units total) by mouth daily.     diazepam (VALIUM) 5 MG tablet TAKE ONE TABLET AT BEDTIME 30 tablet 5   doxazosin (CARDURA) 8 MG tablet Take 1 tablet (8 mg total) by mouth daily. 90 tablet 3   metoprolol succinate (TOPROL-XL) 25 MG 24 hr tablet TAKE 1 TABLET DAILY 90 tablet 1   Multiple Vitamins-Minerals (HAIR/SKIN/NAILS/BIOTIN PO) Take 1 tablet by mouth daily.     potassium chloride (KLOR-CON) 10 MEQ tablet TAKE 8 TABLETS DAILY 720 tablet 3   rosuvastatin (CRESTOR) 5 MG tablet Take one  tablet every Monday, wednesday Friday 20 tablet 11   SYMBICORT 80-4.5 MCG/ACT inhaler 2 PUFFS 2 TIMES A DAY 10.2 g 3   warfarin (COUMADIN) 2 MG tablet Take 1 tablet daily or take as directed by anticoagulation clinic 100 tablet 1   torsemide (DEMADEX) 20 MG tablet Take 2 tablets (40 mg total) by mouth 2 (two) times daily. 180 tablet 3   No facility-administered medications prior to visit.    Allergies  Allergen Reactions   Aldactone [Spironolactone] Other (See Comments)    dyspnea   Amoxicillin Palpitations    Tachycardia Has patient had a PCN reaction causing immediate  rash, facial/tongue/throat swelling, SOB or lightheadedness with hypotension: no Has patient had a PCN reaction causing severe rash involving mucus membranes or skin necrosis: {no Has patient had a PCN reaction that required hospitalization {no Has patient had a PCN reaction occurring within the last 10 years: {yes If all of the above answers are "NO", then may proceed with Cephalosporin use.   Diltiazem Other (See Comments)    Causing headaches    Flagyl [Metronidazole Hcl] Other (See Comments)    Causing headaches     Flovent [Fluticasone Propionate] Other (See Comments)    Leg cramps   Gabapentin Swelling   Lyrica [Pregabalin] Swelling   Quinidine Diarrhea and Other (See Comments)    Fever diarrhea   Simvastatin Other (See Comments)    Leg pain, myalgia   Tramadol Nausea Only   Verapamil Other (See Comments)    myalgias   Amlodipine     Low extremity edema   Crestor [Rosuvastatin]     myalgia   Livalo [Pitavastatin]     myalgias   Zetia [Ezetimibe]     LEG CRAMPS    Ace Inhibitors Other (See Comments)    unknown   Benazepril Hcl Cough   Ciprocin-Fluocin-Procin [Fluocinolone Acetonide] Other (See Comments)    unknown   Ciprofloxacin Diarrhea   Codeine Nausea Only   Nitrofurantoin Monohyd Macro Nausea Only    ROS Review of Systems  Constitutional:  Negative for chills and fever.      Objective:    Physical Exam Vitals reviewed.  Cardiovascular:     Rate and Rhythm: Normal rate.  Skin:    Comments: Patient has very superficial flap laceration dorsum of the mid right forearm.  None gaping.  Wound edges well approximated.  She has approximately 5 x 6 cm surrounding area of faint erythema.  Slightly swollen.  Mild warmth to palpation.  Mildly tender.  No purulent drainage noted.  No bony tenderness in the wrist or elbow region.  Neurological:     Mental Status: She is alert.    BP (!) 142/70 (BP Location: Left Arm, Patient Position: Sitting, Cuff Size: Normal)   Pulse 72   Temp 98.2 F (36.8 C) (Oral)   Wt 153 lb (69.4 kg)   SpO2 94%   BMI 27.10 kg/m  Wt Readings from Last 3 Encounters:  03/10/21 153 lb (69.4 kg)  02/24/21 150 lb (68 kg)  01/20/21 155 lb 3.2 oz (70.4 kg)     Health Maintenance Due  Topic Date Due   COVID-19 Vaccine (4 - Booster for Moderna series) 11/19/2020    There are no preventive care reminders to display for this patient.  Lab Results  Component Value Date   TSH 3.95 07/09/2020   Lab Results  Component Value Date   WBC 5.7 11/27/2020   HGB 13.1 11/27/2020   HCT 39.9 11/27/2020   MCV 91 11/27/2020   PLT 173 11/27/2020   Lab Results  Component Value Date   NA 147 (H) 11/27/2020   K 3.5 11/27/2020   CO2 23 11/27/2020   GLUCOSE 89 11/27/2020   BUN 14 11/27/2020   CREATININE 0.92 11/27/2020   BILITOT 1.0 07/09/2020   ALKPHOS 94 09/24/2019   AST 22 07/09/2020   ALT 13 07/09/2020   PROT 7.1 07/09/2020   ALBUMIN 3.9 09/24/2019   CALCIUM 9.4 11/27/2020   ANIONGAP 11 09/06/2019   EGFR 62 11/27/2020   GFR 67.66 10/01/2019   Lab Results  Component Value Date  CHOL 153 07/09/2020   Lab Results  Component Value Date   HDL 55 07/09/2020   Lab Results  Component Value Date   LDLCALC 77 07/09/2020   Lab Results  Component Value Date   TRIG 126 07/09/2020   Lab Results  Component Value Date   CHOLHDL 2.8  07/09/2020   Lab Results  Component Value Date   HGBA1C 5.3 10/12/2016      Assessment & Plan:   Recent injury with small superficial flap laceration right forearm dorsally.  She appears to have some early cellulitis changes now.  No evidence for abscess.  -Continue frequent elevation -Start Keflex 500 mg 3 times daily for 7 days -follow-up promptly for any fever or any progressive redness or swelling   Meds ordered this encounter  Medications   cephALEXin (KEFLEX) 500 MG capsule    Sig: Take 1 capsule (500 mg total) by mouth 3 (three) times daily.    Dispense:  21 capsule    Refill:  0   fluconazole (DIFLUCAN) 150 MG tablet    Sig: Take 1 tablet (150 mg total) by mouth once for 1 dose.    Dispense:  1 tablet    Refill:  0    Follow-up: No follow-ups on file.    [name], MD

## 2021-03-10 NOTE — Telephone Encounter (Signed)
Call returned to patient, confirmed DOB. Confirmed medication and pharmacy. Refill sent. Voiced understandig. Nothing further needed at this time.

## 2021-03-10 NOTE — Patient Instructions (Signed)

## 2021-03-19 ENCOUNTER — Other Ambulatory Visit: Payer: Self-pay

## 2021-03-19 ENCOUNTER — Ambulatory Visit (HOSPITAL_COMMUNITY)
Admission: RE | Admit: 2021-03-19 | Discharge: 2021-03-19 | Disposition: A | Discharge status: Home / Self Care | Payer: Medicare Other | Source: Ambulatory Visit | Attending: Cardiovascular Disease | Admitting: Cardiovascular Disease

## 2021-03-19 DIAGNOSIS — E785 Hyperlipidemia, unspecified: Secondary | ICD-10-CM | POA: Diagnosis not present

## 2021-03-19 DIAGNOSIS — R0989 Other specified symptoms and signs involving the circulatory and respiratory systems: Secondary | ICD-10-CM | POA: Insufficient documentation

## 2021-03-19 LAB — HEPATIC FUNCTION PANEL
ALT: 20 IU/L (ref 0–32)
AST: 30 IU/L (ref 0–40)
Albumin: 4.6 g/dL (ref 3.6–4.6)
Alkaline Phosphatase: 121 IU/L (ref 44–121)
Bilirubin Total: 0.7 mg/dL (ref 0.0–1.2)
Bilirubin, Direct: 0.22 mg/dL (ref 0.00–0.40)
Total Protein: 6.7 g/dL (ref 6.0–8.5)

## 2021-03-19 LAB — LIPID PANEL
Chol/HDL Ratio: 2.8 ratio (ref 0.0–4.4)
Cholesterol, Total: 153 mg/dL (ref 100–199)
HDL: 54 mg/dL (ref >39)
LDL Chol Calc (NIH): 80 mg/dL (ref 0–99)
Triglycerides: 105 mg/dL (ref 0–149)
VLDL Cholesterol Cal: 19 mg/dL (ref 5–40)

## 2021-03-20 ENCOUNTER — Telehealth: Payer: Self-pay | Admitting: Student

## 2021-03-20 MED ORDER — ROSUVASTATIN CALCIUM 5 MG PO TABS: ORAL_TABLET | ORAL | 11 refills | Status: DC

## 2021-03-20 NOTE — Telephone Encounter (Signed)
     Pt is returning call to get echo and labs result

## 2021-03-20 NOTE — Telephone Encounter (Signed)
Catherine [name], RPH-CPP  03/20/2021  7:31 AM EDT Back to Top     Total cholesterol improved from 222 down to 153. Bad cholesterol (LDL) alsoimproved from 135 to 80 , ut remains above goal of <70  Is patient tolerating rosuvastatin Mondays, Wednesdays , and Fridays? Ifpatient tolerates rosuvastatin 3x/week, please try increasing to 4x/week.   Advised of labs, tolerating Rosuvastatin 3 times She will increase to 4 x a week  To call back if any issues with increased dose  Darreld Mclean, PA-C  03/19/2021  1:39 PM EDT      Please notify patient of results: Carotid dopplers showed mild disease with 1-39% stenosis of bilateral internal carotid arteries. No significant diseasewhich is good.    Thank you!   Advised patient of carotid results

## 2021-03-23 ENCOUNTER — Other Ambulatory Visit: Payer: Self-pay

## 2021-03-23 ENCOUNTER — Ambulatory Visit (INDEPENDENT_AMBULATORY_CARE_PROVIDER_SITE_OTHER): Payer: Medicare Other | Admitting: General Practice

## 2021-03-23 DIAGNOSIS — I4891 Unspecified atrial fibrillation: Secondary | ICD-10-CM

## 2021-03-23 DIAGNOSIS — E785 Hyperlipidemia, unspecified: Secondary | ICD-10-CM

## 2021-03-23 LAB — POCT INR: INR: 2.3 (ref 2.0–3.0)

## 2021-03-23 NOTE — Patient Instructions (Addendum)
Pre visit review using our clinic review tool, if applicable. No additional management support is needed unless otherwise documented below in the visit note.  Continue to take 1 tablet daily except 1/2 tablet on Wednesday and Saturday.  Re-check in 6 weeks.

## 2021-03-24 ENCOUNTER — Telehealth: Payer: Self-pay | Admitting: Pharmacist

## 2021-03-24 NOTE — Chronic Care Management (AMB) (Signed)
Chronic Care Management Pharmacy Assistant   Name: Catherine [name]  MRN: [id number] DOB: Apr 01, 1935  Reason for Encounter: Disease State/ Hyperlipidemia Assessment Call.    Conditions to be addressed/monitored: HLD  Recent office visits:  03/23/21 Meriam Sprague RN (Anti- Coag Nurse) - visit to Anticoagulation clinic. No medication changes.   03/10/21 [name] MD (PCP) - seen for cellulitis of right wrist. Patient started on Fluconazole 150mg . Changed Cephalexin from 250mg  daily to 500mg  3 times daily. Follow up if no improvement.   Recent consult visits:  02/24/21 Sande Rives PA-C (Cardiology) - presented to clinic for follow up on CAD and other chronic conditions. Discontinued Cephalexin 250mg  daily and changed to 250mg  twice daily. Discontinued aspirin 81mg . Follow up in 6 months.   Hospital visits:  None in previous 6 months  Medications: Outpatient Encounter Medications as of 03/24/2021  Medication Sig   acetaminophen (TYLENOL) 500 MG tablet Take 500 mg by mouth daily.   albuterol (VENTOLIN HFA) 108 (90 Base) MCG/ACT inhaler Inhale 2 puffs into the lungs every 4 (four) hours as needed for wheezing or shortness of breath.   cephALEXin (KEFLEX) 250 MG capsule TAKE ONE CAPSULE ONCE DAILY   cephALEXin (KEFLEX) 500 MG capsule Take 1 capsule (500 mg total) by mouth 3 (three) times daily.   cholecalciferol (VITAMIN D3) 25 MCG (1000 UNIT) tablet Take 1 tablet (1,000 Units total) by mouth daily.   diazepam (VALIUM) 5 MG tablet TAKE ONE TABLET AT BEDTIME   doxazosin (CARDURA) 8 MG tablet Take 1 tablet (8 mg total) by mouth daily.   metoprolol succinate (TOPROL-XL) 25 MG 24 hr tablet TAKE 1 TABLET DAILY   Multiple Vitamins-Minerals (HAIR/SKIN/NAILS/BIOTIN PO) Take 1 tablet by mouth daily.   potassium chloride (KLOR-CON) 10 MEQ tablet TAKE 8 TABLETS DAILY   rosuvastatin (CRESTOR) 5 MG tablet Take one tablet every Monday, wednesday Friday and Sunday   SYMBICORT 80-4.5 MCG/ACT  inhaler 2 PUFFS 2 TIMES A DAY   torsemide (DEMADEX) 20 MG tablet Take 2 tablets (40 mg total) by mouth 2 (two) times daily.   warfarin (COUMADIN) 2 MG tablet Take 1 tablet daily or take as directed by anticoagulation clinic   No facility-administered encounter medications on file as of 03/24/2021.   Comprehensive medication review performed; Spoke to patient regarding cholesterol  Lipid Panel    Component Value Date/Time   CHOL 153 03/19/2021 1023   TRIG 105 03/19/2021 1023   HDL 54 03/19/2021 1023   LDLCALC 80 03/19/2021 1023   LDLCALC 77 07/09/2020 1403   LDLDIRECT 68.0 09/24/2019 1054    10-year ASCVD risk score: The ASCVD Risk score Mikey Bussing DC Jr., et al., 2013) failed to calculate for the following reasons:   The 2013 ASCVD risk score is only valid for ages 52 to 104  Current antihyperlipidemic regimen:  Rosuvastatin 5mg  - take 1 tablet every Monday, Wednesday, Friday and Sunday.  Previous antihyperlipidemic medications tried: None.  ASCVD risk enhancing conditions: age >40 and HTN What recent interventions/DTPs have been made by any provider to improve Cholesterol control since last CPP Visit: Rosuvastatin prescribed to patient for cholesterol.  Any recent hospitalizations or ED visits since last visit with CPP? No What diet changes have been made to improve Cholesterol?  Patient stated that for breakfast she has a fresh orange, some oatmeal with peanut butter and honey and a glass of milk. Patient does not drink coffee anymore since she had covid two years ago. It turned her taste buds off  for coffee per patient. For lunch patient stated she usually only has a pack of nabs and a glass of water except on the two days a week she has silver sneakers her and her husband have lunch at [employer] and she will have a tomato biscuit and a glass of water. For dinner patient does not usually eat meat but she will have some beans, vegetables  or like cottage cheese and fruit. Patient states she  typically drinks around 4 16 ounce glasses of water a day.  What exercise is being done to improve Cholesterol?  Patient sates that her and her husband go to [employer] two days a week and she does all the housework around the house on her own like cleaning and cooking if needed. Patient is able to perform self care duties such as bathing, dressing and feeding herself.  Adherence Review: Does the patient have >5 day gap between last estimated fill dates? No  Notes: Spoke with patient and reviewed all medications as listed. Patient reports taking all medications as prescribed except she is now done with the Keflex 500mg  3 times daily and now is back to only the 250mg  daily for her recurrent UTI's. Patient reports no issues form her medications at this time. Patient thanked me for my call.   Star Rating Drugs:  Rosuvastatin 5mg  - last filled on 03/20/21 58DS at [facility].   [place]  Clinical Pharmacist Assistant [phone]

## 2021-04-01 ENCOUNTER — Encounter: Payer: Self-pay | Admitting: Internal Medicine

## 2021-04-01 ENCOUNTER — Ambulatory Visit: Payer: Medicare Other | Admitting: Internal Medicine

## 2021-04-01 ENCOUNTER — Other Ambulatory Visit: Payer: Self-pay

## 2021-04-01 DIAGNOSIS — J449 Chronic obstructive pulmonary disease, unspecified: Secondary | ICD-10-CM | POA: Diagnosis not present

## 2021-04-01 NOTE — Progress Notes (Signed)
Subjective:     Patient ID: Catherine [name], female   DOB: 1935-04-16,   MRN: [id number]    Brief patient profile:  48  yowf quit smoking in 1977 with GOLD II criteria 6/29016  at her best can do 5 days/7 walk x one half mile 3.2 mph flat but not doing as well since spring 2016 then admitted p flare of copd with new kidney stone/GB pain    Admit date: 03/15/2015 Discharge date: 03/17/2015    Discharge Condition: stable Diet recommendation: low fat, low sodium  Discharge Diagnoses:  Principal Problem:   Left flank pain/  Nephrolithiasis  Right upper quadrant pain-chronic cholecystitis   Essential hypertension   Atrial fibrillation, chronic   Warfarin-induced coagulopathy   Valvular heart disease   COPD (chronic obstructive pulmonary disease)  History of present illness:  -86 year old female who was discharged from the hospital on 6/17 after being treated for a COPD exacerbation. She returned to the hospital on 6/18 with severe left-sided flank pain. The pain eventually moved to left anterior abdomen and then the groin and then resolved. A CT scan of the abdomen and pelvis was done which revealed a forniceal rupture and a 3 mm calculus at the left UV junction or just inside the bladder. This was consistent with her presentation and at this time she has likely passed the stone. The CT scan above incidentally found chronic large gallstone. Further imaging with the ultrasound revealed acute cholecystitis with a 2.6 cm gallstone and possible additional stones. The patient is admitted to intermittent right upper quadrant pain but no significant issues with vomiting or diarrhea.  Hospital Course:  Principal Problem:   Acute cholecystitis? -As mentioned above, CT scan revealed a large gallstone and ultrasound was suspicious of acute cholecystitis -HIDA negative for acute cholecystitis therefore she does not currently have acute cholecystitis however surgery does suspect she has a chronic  cholecystitis as she does complain of having intermittent right upper quadrant pain for the past couple of months -Surgery (Dr Excell Seltzer) recommending surgery at a later date for chronic cholecystitis and is asking for medical clearance for surgery  Active Problems: Left sided nephrolithiasis with forniceal rupture-discussed with urology -no further management necessary as her pain has resolved indicating passage of the stone  COPD flare -Was admitted for this and discharged on the 17th -has not been hypoxic or wheezing during the hospital stay but continues to have a cough with yellow sputum    Essential hypertension -Atenolol and Maxzide held on admission as she was bradycardic and has acute renal failure - ARF resolved- can resume Losartan    Atrial fibrillation, chronic -she was bradycardic-Atenolol was on hold - HR was in 50-60 range- had a-fib with HR in low 100s for almost 5 min this AM- will resume Atenolol at 12.5 mg daily- cont to hold Digoxin    Warfarin-induced coagulopathy -CHADS Vasc 4 -INR 3.57 on admission - Coumadin was hold for possible surgery- INR 1.49- no surgery planned for this admission- can go back on coumadin takes 2 mg on add days except Wed when she takes 16m- advised to go back to 2 mg daily and have INR checked in 3 days by PCP   Moderate to severe mitral regurgitation -Unchanged from prior echo in 2015   03/24/2015 1st  Pulmonary office visit/ Catherine [name] / pre op consultation  Chief Complaint  Patient presents with   HFU    Breathing has improved. Her cough has improved some, but she still occ  coughs up yellow sputum.   at baseline using saba twice weekly then yardwrok on   03/07/15  then hackiing 6/11 and admit   03/11/15 with copd/ab  Still some am cough but otherwise back to baseline on symb 80 2bid  Not limited by breathing from desired activities  rec Continue symbicort 80 Take 2 puffs first thing in am and then another 2 puffs about 12 hours later  indefinitely since you had an attack and proair didn't help Only use your albuterol (proair) as a rescue medication You are cleared for gall bladder surgery > did  Fine     07/29/2016  Pulmonary consultation /Catherine [name] re: unexplained sob in pt with MS/ GOLD II criteria copd  Chief Complaint  Patient presents with   Pulmonary Consult    Referred by Dr. Oval Linsey. Pt states that she is going to have mitral valve replacement soon. She c/o increased SOB since her last visit in 2016. She gets SOB doing housework.     progressive doe x year not able to trigger hfa effectively (at least a one second delay)  Previously able to do half mile on a treadmill  X  3.5 mph and flat now  2.2 x 0.2 of a mile then stops due to sob occ orthopnea/ maybe once a month Always recovers within a few minutes   rec Plan A = Automatic = Symbicort 80 Take 2 puffs first thing in am and then another 2 puffs about 12 hours later.  Work on inhaler technique:  - late add  If really needs hfa may be required to use spacer as could never trigger the mdi at onset of insp, only > 1 sec prior Plan B = Backup Only use your albuterol as rescue    Oct 14 2016  MVR [name]> marked improvement in doe   Phone call  12/16/17 Dr MR She saw ppc [name], MD 12/13/17 for fatigue She called cardiology 12/16/2017 for dyspnea for a week and then asked to call here  Please take prednisone 40 mg x1 day, then 30 mg x1 day, then 20 mg x1 day, then 10 mg x1 day, and then 5 mg x1 day and stop   -     12/20/2017 acute extended ov/Catherine [name] re:  ? aecopd  Chief Complaint  Patient presents with   Acute Visit    Increased SOB since 12/10/17.  She states she gets SOB "anytime" sometimes with just talking. She was prescribe pred taper and this helped some.   sob at rest s relief from albuterol  "like my insides are quivering"  - symptoms better with walking (see walk today)  Some better with prednisone No cough/ some sensation of strangling on  water  Changed from full diazepam 5 mg at hs to one half around 3 weeks prior to onset of "quivering" and has really not slept well at all since this change was made ? Around 11/24/17  - not having noct cough / wheeze or early am exac rec Try back on a whole diazepam now and one at bedtime just for a week to see to what extent your problem improves and if worsens again on the half pill after that and if so return to see Dr Elease Hashimoto and if not return here with all meds in hand  Plan A = Automatic = symbicort 80 Take 2 puffs first thing in am and then another 2 puffs about 12 hours later.  Try adding prilosec otc 74m  Take 30-60 min before first meal of the day and Pepcid ac (famotidine) 20 mg one @  bedtime  X 4 week trial  GERD diet   Plan B = Backup Only use your albuterol as a rescue medication  Dec 2020 covid > [facility] > no change meds/ no 02    01/08/2020  f/u ov/Catherine [name] re:  GOLD II copd on symbicort 80 2bid  Chief Complaint  Patient presents with   Follow-up    Breathing is doing well today. She has been doing some walking outside. She uses her albuterol 1 x per wk on average.   Dyspnea:  Improving with minimal need for saba  Cough:none Sleeping: slt elevation / thick pillow  SABA use: rare 02: none  rec No change in respiratory medications  - if doing great ok to reduce symbicort 80 to one twice daily    07/09/2020  f/u ov/Catherine [name] re:  GOLD II copd still on symb 80 2bid  Chief Complaint  Patient presents with   Follow-up    SOB with activity  Dyspnea:  Very inactive / depressed as staying secluded   Walks 20 min s stopping s checking 02 sat ever while walking , only afterward Cough: none   Sleeping:  Fine p valium  SABA use: rarely , never pre-challenges prior to ex  02: none Rec Be sure you have your TSH checked today due to your fatigue  (I also added BNP and ESR for evaluation of dyspnea on exertion)  To get the most out of exercise, you need to be continuously  aware that you are short of breath, but never out of breath, for 30 minutes daily. Try albuterol 15 min before an activity that you know would make you short of breath and see if it makes any difference     Please remember to go to the  x-ray department  Mild cardiomegaly with central pulmonary vascular congestion and diffuse interstitial prominence. Differential includes interstitial edema versus viral etiologies >  rec continue lasix  Please schedule a follow up office visit in 6 weeks, call sooner if needed > did not return    04/01/2021  f/u ov/Catherine [name] re:  GOLD II copd  Chief Complaint  Patient presents with   Follow-up    COPD gold,. She is coming in for a follow up ,.    Dyspnea:  able to do silver sneakers several times per week  Does fine pushing cart Cough: none  Sleeping: bed is flat/ one pillow thick / no resp cc SABA use: symb 80 2bid  02: none  Covid status:   vax x 3    No obvious day to day or daytime variability or assoc excess/ purulent sputum or mucus plugs or hemoptysis or cp or chest tightness, subjective wheeze or overt sinus or hb symptoms.   Sleeping  without nocturnal  or early am exacerbation  of respiratory  c/o's or need for noct saba. Also denies any obvious fluctuation of symptoms with weather or environmental changes or other aggravating or alleviating factors except as outlined above   No unusual exposure hx or h/o childhood pna/ asthma or knowledge of premature birth.  Current Allergies, Complete Past Medical History, Past Surgical History, Family History, and Social History were reviewed in [employer] record.  ROS  The following are not active complaints unless bolded Hoarseness, sore throat, dysphagia, dental problems, itching, sneezing,  nasal congestion or discharge of excess mucus or purulent secretions, ear ache,  fever, chills, sweats, unintended wt loss or wt gain, classically pleuritic or exertional cp,  orthopnea pnd or  arm/hand swelling  or leg swelling, presyncope, palpitations, abdominal pain, anorexia, nausea, vomiting, diarrhea  or change in bowel habits or change in bladder habits, change in stools or change in urine, dysuria, hematuria,  rash, arthralgias, visual complaints, headache, numbness, weakness or ataxia or problems with walking or coordination,  change in mood or  memory.        Current Meds  Medication Sig   acetaminophen (TYLENOL) 500 MG tablet Take 500 mg by mouth daily.   albuterol (VENTOLIN HFA) 108 (90 Base) MCG/ACT inhaler Inhale 2 puffs into the lungs every 4 (four) hours as needed for wheezing or shortness of breath.   cephALEXin (KEFLEX) 250 MG capsule TAKE ONE CAPSULE ONCE DAILY   cholecalciferol (VITAMIN D3) 25 MCG (1000 UNIT) tablet Take 1 tablet (1,000 Units total) by mouth daily.   diazepam (VALIUM) 5 MG tablet TAKE ONE TABLET AT BEDTIME   doxazosin (CARDURA) 8 MG tablet Take 1 tablet (8 mg total) by mouth daily.   metoprolol succinate (TOPROL-XL) 25 MG 24 hr tablet TAKE 1 TABLET DAILY   Multiple Vitamins-Minerals (HAIR/SKIN/NAILS/BIOTIN PO) Take 1 tablet by mouth daily.   potassium chloride (KLOR-CON) 10 MEQ tablet TAKE 8 TABLETS DAILY   rosuvastatin (CRESTOR) 5 MG tablet Take one tablet every Monday, wednesday Friday and Sunday   SYMBICORT 80-4.5 MCG/ACT inhaler 2 PUFFS 2 TIMES A DAY   warfarin (COUMADIN) 2 MG tablet Take 1 tablet daily or take as directed by anticoagulation clinic                 Objective:   Physical Exam        04/01/2021          153 07/09/2020      152  01/08/2020        145  12/20/2017        149   07/29/2016       15 1  03/24/15 139 lb 12.8 oz (63.413 kg)  03/20/15 141 lb (63.957 kg)  03/16/15 143 lb (64.864 kg)     Vital signs reviewed  04/01/2021  - Note at rest 02 sats  96% on RA   General appearance:    slt hoarse well preserved amb wf nad      HEENT : pt wearing mask not removed for exam due to covid - 19 concerns.   NECK :  without  JVD/Nodes/TM/ nl carotid upstrokes bilaterally   LUNGS: no acc muscle use,  Min barrel  contour chest wall with bilateral  slightly decreased bs s audible wheeze and  without cough on insp or exp maneuvers and min  Hyperresonant  to  percussion bilaterally     CV:  RRR  no s3 or murmur or increase in P2, and no edema   ABD:  soft and nontender with pos end  insp Hoover's  in the supine position. No bruits or organomegaly appreciated, bowel sounds nl  MS:   Nl gait/  ext warm without deformities, calf tenderness, cyanosis or clubbing No obvious joint restrictions   SKIN: warm and dry without lesions    NEURO:  alert, approp, nl sensorium with  no motor or cerebellar deficits apparent.                  Assessment:

## 2021-04-01 NOTE — Assessment & Plan Note (Signed)
Quit smoking 1977 - Spirometry 03/24/15  FEV1  1.41 (70%) ratio 65  - 03/26/2015  Walked RA x 3 laps @ 185 ft each stopped due to  End of study, brisk pace, no sob or desat  - Spirometry 07/29/2016  FEV1 1.30 (66%)  Ratio 66   - Spirometry 12/20/2017  FEV1 1.34 (69%)  Ratio 71 with min curvature p am symb 80 and saba w/in 1 hour - 12/20/2017  Walked RA x 3 laps @ 185 ft each stopped due to  End of study, moderately fast pace, no sob or desat     - 07/09/2020  After extensive coaching inhaler device,  effectiveness =    95%   Bothered by hoarseness from ICS and may not actually need them at this point but just got supply of the 160 symb and wants to try them so suggested:  Ok to use 160 Take 2 puffs first thing in am and then another 2 puffs about 12 hours later.   If worse hoarseness try  the spacer and or the arm and hammer toothpast slurry p rx   If neither work, then [employer] or stioloto best options here.   F/u q 12 m, sooner prn         Each maintenance medication was reviewed in detail including emphasizing most importantly the difference between maintenance and prns and under what circumstances the prns are to be triggered using an action plan format where appropriate.  Total time for H and P, chart review, counseling, reviewing hfa device(s) and generating customized AVS unique to this office visit / same day charting = 28 min

## 2021-04-01 NOTE — Patient Instructions (Signed)
Plan A = Automatic = Always=  Symbicort  80  or 160 Take 2 puffs first thing in am and then another 2 puffs about 12 hours later.    Ok to use a spacer device to see if it helps your hoarseness / arm and hammer toothpaste slurry may also help  Plan B = Backup (to supplement plan A, not to replace it) Only use your albuterol inhaler as a rescue medication to be used if you can't catch your breath by resting or doing a relaxed purse lip breathing pattern.  - The less you use it, the better it will work when you need it. - Ok to use the inhaler up to 2 puffs  every 4 hours if you must but call for appointment if use goes up over your usual need - Don't leave home without it !!  (think of it like the spare tire for your car)     Please schedule a follow up visit in 12 months but call sooner if needed

## 2021-04-06 ENCOUNTER — Other Ambulatory Visit: Payer: Self-pay | Admitting: Cardiovascular Disease

## 2021-04-07 ENCOUNTER — Ambulatory Visit (INDEPENDENT_AMBULATORY_CARE_PROVIDER_SITE_OTHER): Payer: Medicare Other

## 2021-04-07 DIAGNOSIS — Z0000 Encounter for general adult medical examination without abnormal findings: Secondary | ICD-10-CM | POA: Diagnosis not present

## 2021-04-07 NOTE — Progress Notes (Signed)
Subjective:   Catherine [name] is a 86 y.o. female who presents for an Initial Medicare Annual Wellness Visit.  I connected with Jayani Rozman today by telephone and verified that I am speaking with the correct person using two identifiers. Location patient: home Location provider: work Persons participating in the virtual visit: patient, provider.   I discussed the limitations, risks, security and privacy concerns of performing an evaluation and management service by telephone and the availability of in person appointments. I also discussed with the patient that there may be a patient responsible charge related to this service. The patient expressed understanding and verbally consented to this telephonic visit.    Interactive audio and video telecommunications were attempted between this provider and patient, however failed, due to patient having technical difficulties OR patient did not have access to video capability.  We continued and completed visit with audio only.    Review of Systems    N/a       Objective:    There were no vitals filed for this visit. There is no height or weight on file to calculate BMI.  Advanced Directives 09/03/2019 09/03/2019 09/02/2019 08/29/2019 12/23/2016 10/17/2016 10/12/2016  Does Patient Have a Medical Advance Directive? No No No No Yes No Yes  Type of Advance Directive - - - - Living will - Living will  Copy of [place] in Chart? - - - - - - -  Would patient like information on creating a medical advance directive? No - Patient declined Yes (ED - Information included in AVS) Yes (ED - Information included in AVS) No - Patient declined - No - Patient declined -    Current Medications (verified) Outpatient Encounter Medications as of 04/07/2021  Medication Sig   acetaminophen (TYLENOL) 500 MG tablet Take 500 mg by mouth daily.   albuterol (VENTOLIN HFA) 108 (90 Base) MCG/ACT inhaler Inhale 2 puffs into the lungs every 4 (four) hours  as needed for wheezing or shortness of breath.   cephALEXin (KEFLEX) 250 MG capsule TAKE ONE CAPSULE ONCE DAILY   cholecalciferol (VITAMIN D3) 25 MCG (1000 UNIT) tablet Take 1 tablet (1,000 Units total) by mouth daily.   diazepam (VALIUM) 5 MG tablet TAKE ONE TABLET AT BEDTIME   doxazosin (CARDURA) 8 MG tablet Take 1 tablet (8 mg total) by mouth daily.   metoprolol succinate (TOPROL-XL) 25 MG 24 hr tablet TAKE 1 TABLET DAILY   Multiple Vitamins-Minerals (HAIR/SKIN/NAILS/BIOTIN PO) Take 1 tablet by mouth daily.   potassium chloride (KLOR-CON) 10 MEQ tablet TAKE 8 TABLETS DAILY   rosuvastatin (CRESTOR) 5 MG tablet Take one tablet every Monday, wednesday Friday and Sunday   SYMBICORT 80-4.5 MCG/ACT inhaler 2 PUFFS 2 TIMES A DAY   torsemide (DEMADEX) 20 MG tablet TAKE (2) TABLETS TWICE DAILY.   warfarin (COUMADIN) 2 MG tablet Take 1 tablet daily or take as directed by anticoagulation clinic   No facility-administered encounter medications on file as of 04/07/2021.    Allergies (verified) Aldactone [spironolactone], Amoxicillin, Diltiazem, Flagyl [metronidazole hcl], Flovent [fluticasone propionate], Gabapentin, Lyrica [pregabalin], Quinidine, Simvastatin, Tramadol, Verapamil, Amlodipine, Crestor [rosuvastatin], Livalo [pitavastatin], Zetia [ezetimibe], Ace inhibitors, Benazepril hcl, Ciprocin-fluocin-procin [fluocinolone acetonide], Ciprofloxacin, Codeine, and Nitrofurantoin monohyd macro   History: Past Medical History:  Diagnosis Date   Acute on chronic diastolic heart failure (HCC)    Aortic stenosis, mild 07/26/2017   Arthritis    Asthma    last attack 02/2015   Atrial fibrillation, chronic (HCC)    Cellulitis  of left lower extremity 08/17/2016   Ulcer associated with severe venous insufficiency   Chronic anticoagulation    Chronic diastolic CHF (congestive heart failure) ([place])    Chronic kidney disease    "RIGHT MANY KIDNEY INFECTIONS AND STONES"   COPD (chronic obstructive pulmonary  disease) (HCC)    Coronary artery disease    Dizziness    H/O: rheumatic fever    Heart murmur    Hypertension    PONV (postoperative nausea and vomiting)    ' SOMETIMES', BUT NOT ALWAYS"   S/P Maze operation for atrial fibrillation 10/14/2016   Complete bilateral atrial lesion set using cryothermy and bipolar radiofrequency ablation - atrial appendage was not treated due to previous surgical procedure (open mitral commissurotomy)   S/P mitral valve replacement with bioprosthetic valve 10/14/2016   29 mm Medtronic Mosaic porcine bioprosthetic tissue valve   UTI (urinary tract infection)    Valvular heart disease    Has mitral stenosis with prior mitral commissurotomy in 1970   Past Surgical History:  Procedure Laterality Date   ABDOMINAL HYSTERECTOMY  1983   endometriosis   APPENDECTOMY     BACK SURGERY     neurosurgery x2   CARDIAC CATHETERIZATION     CARDIAC CATHETERIZATION N/A 08/03/2016   Procedure: Right/Left Heart Cath and Coronary Angiography;  Surgeon: Peter M Martinique, MD;  Location: [facility];  Service: Cardiovascular;  Laterality: N/A;   cataract surg     CHOLECYSTECTOMY N/A 05/30/2015   Procedure: LAPAROSCOPIC CHOLECYSTECTOMY WITH INTRAOPERATIVE CHOLANGIOGRAM;  Surgeon: Excell Seltzer, MD;  Location: [facility];  Service: General;  Laterality: N/A;   COLONOSCOPY     CORONARY ARTERY BYPASS GRAFT N/A 10/14/2016   Procedure: CORONARY ARTERY BYPASS GRAFTING (CABG);  Surgeon: Rexene Alberts, MD;  Location: [facility];  Service: Open Heart Surgery;  Laterality: N/A;   EYE SURGERY     MAZE N/A 10/14/2016   Procedure: MAZE;  Surgeon: Rexene Alberts, MD;  Location: [facility];  Service: Open Heart Surgery;  Laterality: N/A;   MITRAL VALVE REPLACEMENT N/A 10/14/2016   Procedure: REDO MITRAL VALVE REPLACEMENT (MVR);  Surgeon: Rexene Alberts, MD;  Location: [facility];  Service: Open Heart Surgery;  Laterality: N/A;   MITRAL VALVE SURGERY Left 1970   Open mitral commissurotomy via left  thoracotomy approach   TEE WITHOUT CARDIOVERSION N/A 07/05/2016   Procedure: TRANSESOPHAGEAL ECHOCARDIOGRAM (TEE);  Surgeon: [name], MD;  Location: [facility];  Service: Cardiovascular;  Laterality: N/A;   TEE WITHOUT CARDIOVERSION N/A 10/14/2016   Procedure: TRANSESOPHAGEAL ECHOCARDIOGRAM (TEE);  Surgeon: Rexene Alberts, MD;  Location: [facility];  Service: Open Heart Surgery;  Laterality: N/A;   Family History  Problem Relation Age of Onset   Leukemia Father    Breast cancer Neg Hx    Social History   Socioeconomic History   Marital status: Married    Spouse name: Not on file   Number of children: Not on file   Years of education: Not on file   Highest education level: Not on file  Occupational History   Not on file  Tobacco Use   Smoking status: Former    Packs/day: 1.00    Years: 15.00    Pack years: 15.00    Types: Cigarettes    Quit date: 09/28/1975    Years since quitting: 45.5   Smokeless tobacco: Never  Vaping Use   Vaping Use: Never used  Substance and Sexual Activity   Alcohol use: No  Alcohol/week: 0.0 standard drinks   Drug use: No   Sexual activity: Yes  Other Topics Concern   Not on file  Social History Narrative   Not on file   Social Determinants of Health   Financial Resource Strain: High Risk   Difficulty of Paying Living Expenses: Hard  Food Insecurity: Not on file  Transportation Needs: No Transportation Needs   Lack of Transportation (Medical): No   Lack of Transportation (Non-Medical): No  Physical Activity: Not on file  Stress: Not on file  Social Connections: Not on file    Tobacco Counseling Counseling given: Not Answered   Clinical Intake:                 Diabetic?no         Activities of Daily Living No flowsheet data found.  Patient Care Team: [name], MD as PCP - General (Family Medicine) [name], MD as PCP - Cardiology (Cardiology) Germaine Pomfret, [facility] as Pharmacist  (Pharmacist)  Indicate any recent Medical Services you may have received from other than [facility] providers in the past year (date may be approximate).     Assessment:   This is a routine wellness examination for Avrie.  Hearing/Vision screen No results found.  Dietary issues and exercise activities discussed:     Goals Addressed   None    Depression Screen PHQ 2/9 Scores 09/09/2020 07/01/2020 04/18/2019 03/25/2017 12/23/2016 12/20/2016 11/21/2015  PHQ - 2 Score 1 0 2 0 2 1 0  PHQ- 9 Score - 4 4 3 11  - -    Fall Risk Fall Risk  09/09/2020 07/09/2020 07/01/2020 04/18/2019 12/23/2016  Falls in the past year? 0 0 1 1 No  Number falls in past yr: 0 0 0 0 -  Injury with Fall? - 0 0 - -  Risk for fall due to : - - Impaired mobility - -  Follow up - - Falls evaluation completed - -    FALL RISK PREVENTION PERTAINING TO THE HOME:  Any stairs in or around the home? Yes  If so, are there any without handrails? No  Home free of loose throw rugs in walkways, pet beds, electrical cords, etc? Yes  Adequate lighting in your home to reduce risk of falls? Yes   ASSISTIVE DEVICES UTILIZED TO PREVENT FALLS:  Life alert? No  Use of a cane, walker or w/c? No  Grab bars in the bathroom? No  Shower chair or bench in shower? Yes  Elevated toilet seat or a handicapped toilet? No    Cognitive Function:    Normal cognitive status assessed by direct observation by this Nurse Health Advisor. No abnormalities found.      Immunizations Immunization History  Administered Date(s) Administered   Fluad Quad(high Dose 65+) 07/01/2020   Influenza Split 08/22/2011, 08/06/2012, 07/26/2013   Influenza, High Dose Seasonal PF 09/17/2015, 07/12/2016, 08/01/2017   Influenza,inj,Quad PF,6+ Mos 06/21/2019   Influenza,inj,quad, With Preservative 08/28/2018, 06/21/2019   Influenza-Unspecified 09/18/2018   Moderna Sars-Covid-2 Vaccination 11/06/2019, 12/04/2019, 08/19/2020   Pneumococcal Conjugate-13 09/17/2015    Pneumococcal Polysaccharide-23 11/22/2011   Tdap 05/04/2012   Zoster Recombinat (Shingrix) 10/13/2017, 12/13/2017   Zoster, Live 08/22/2011    TDAP status: Up to date  Flu Vaccine status: Up to date  Pneumococcal vaccine status: Up to date  Covid-19 vaccine status: Completed vaccines  Qualifies for Shingles Vaccine? Yes   Zostavax completed No   Shingrix Completed?: No.    Education has been provided regarding  the importance of this vaccine. Patient has been advised to call insurance company to determine out of pocket expense if they have not yet received this vaccine. Advised may also receive vaccine at local pharmacy or Health Dept. Verbalized acceptance and understanding.  Screening Tests Health Maintenance  Topic Date Due   COVID-19 Vaccine (4 - Booster for Moderna series) 11/19/2020   INFLUENZA VACCINE  04/27/2021   TETANUS/TDAP  05/04/2022   DEXA SCAN  Completed   PNA vac Low Risk Adult  Completed   Zoster Vaccines- Shingrix  Completed   HPV VACCINES  Aged Out    Health Maintenance  Health Maintenance Due  Topic Date Due   COVID-19 Vaccine (4 - Booster for Moderna series) 11/19/2020    Colorectal cancer screening: No longer required.   Mammogram status: No longer required due to age.  Bone Density status: Completed 05/17/2012. Results reflect: Bone density results: OSTEOPENIA. Repeat every 5 years.  Lung Cancer Screening: (Low Dose CT Chest recommended if Age 52-80 years, 30 pack-year currently smoking OR have quit w/in 15years.) does not qualify.   Lung Cancer Screening Referral: n/a  Additional Screening:  Hepatitis C Screening: does not qualify;   Vision Screening: Recommended annual ophthalmology exams for early detection of glaucoma and other disorders of the eye. Is the patient up to date with their annual eye exam?  Yes  Who is the provider or what is the name of the office in which the patient attends annual eye exams? Dr.grote If pt is not  established with a provider, would they like to be referred to a provider to establish care? No .   Dental Screening: Recommended annual dental exams for proper oral hygiene  Community Resource Referral / Chronic Care Management: CRR required this visit?  No   CCM required this visit?  No      Plan:     I have personally reviewed and noted the following in the patient's chart:   Medical and social history Use of alcohol, tobacco or illicit drugs  Current medications and supplements including opioid prescriptions. Patient is not currently taking opioid prescriptions. Functional ability and status Nutritional status Physical activity Advanced directives List of other physicians Hospitalizations, surgeries, and ER visits in previous 12 months Vitals Screenings to include cognitive, depression, and falls Referrals and appointments  In addition, I have reviewed and discussed with patient certain preventive protocols, quality metrics, and best practice recommendations. A written personalized care plan for preventive services as well as general preventive health recommendations were provided to patient.     Randel Pigg, LPN   [id number]   Nurse Notes: none

## 2021-04-07 NOTE — Patient Instructions (Signed)
Catherine [name] , Thank you for taking time to come for your Medicare Wellness Visit. I appreciate your ongoing commitment to your health goals. Please review the following plan we discussed and let me know if I can assist you in the future.   Screening recommendations/referrals: Colonoscopy: no longer required  Mammogram: no longer required  Bone Density: 05/17/2012  pr declines to update  Recommended yearly ophthalmology/optometry visit for glaucoma screening and checkup Recommended yearly dental visit for hygiene and checkup  Vaccinations: Influenza vaccine: due in fall 2022  Pneumococcal vaccine: completed series  Tdap vaccine: 05/04/2012   due 2023 Shingles vaccine: will obtain local pharmacy     Advanced directives: will provide copies   Conditions/risks identified: none   Next appointment: none    Preventive Care 45 Years and Older, Female Preventive care refers to lifestyle choices and visits with your health care provider that can promote health and wellness. What does preventive care include? A yearly physical exam. This is also called an annual well check. Dental exams once or twice a year. Routine eye exams. Ask your health care provider how often you should have your eyes checked. Personal lifestyle choices, including: Daily care of your teeth and gums. Regular physical activity. Eating a healthy diet. Avoiding tobacco and drug use. Limiting alcohol use. Practicing safe sex. Taking low-dose aspirin every day. Taking vitamin and mineral supplements as recommended by your health care provider. What happens during an annual well check? The services and screenings done by your health care provider during your annual well check will depend on your age, overall health, lifestyle risk factors, and family history of disease. Counseling  Your health care provider may ask you questions about your: Alcohol use. Tobacco use. Drug use. Emotional well-being. Home and  relationship well-being. Sexual activity. Eating habits. History of falls. Memory and ability to understand (cognition). Work and work Statistician. Reproductive health. Screening  You may have the following tests or measurements: Height, weight, and BMI. Blood pressure. Lipid and cholesterol levels. These may be checked every 5 years, or more frequently if you are over 2 years old. Skin check. Lung cancer screening. You may have this screening every year starting at age 78 if you have a 30-pack-year history of smoking and currently smoke or have quit within the past 15 years. Fecal occult blood test (FOBT) of the stool. You may have this test every year starting at age 51. Flexible sigmoidoscopy or colonoscopy. You may have a sigmoidoscopy every 5 years or a colonoscopy every 10 years starting at age 61. Hepatitis C blood test. Hepatitis B blood test. Sexually transmitted disease (STD) testing. Diabetes screening. This is done by checking your blood sugar (glucose) after you have not eaten for a while (fasting). You may have this done every 1-3 years. Bone density scan. This is done to screen for osteoporosis. You may have this done starting at age 43. Mammogram. This may be done every 1-2 years. Talk to your health care provider about how often you should have regular mammograms. Talk with your health care provider about your test results, treatment options, and if necessary, the need for more tests. Vaccines  Your health care provider may recommend certain vaccines, such as: Influenza vaccine. This is recommended every year. Tetanus, diphtheria, and acellular pertussis (Tdap, Td) vaccine. You may need a Td booster every 10 years. Zoster vaccine. You may need this after age 77. Pneumococcal 13-valent conjugate (PCV13) vaccine. One dose is recommended after age 37. Pneumococcal polysaccharide (PPSV23)  vaccine. One dose is recommended after age 66. Talk to your health care provider  about which screenings and vaccines you need and how often you need them. This information is not intended to replace advice given to you by your health care provider. Make sure you discuss any questions you have with your health care provider. Document Released: 10/10/2015 Document Revised: 06/02/2016 Document Reviewed: 07/15/2015 Elsevier Interactive Patient Education  2017 [place] Prevention in the Home Falls can cause injuries. They can happen to people of all ages. There are many things you can do to make your home safe and to help prevent falls. What can I do on the outside of my home? Regularly fix the edges of walkways and driveways and fix any cracks. Remove anything that might make you trip as you walk through a door, such as a raised step or threshold. Trim any bushes or trees on the path to your home. Use bright outdoor lighting. Clear any walking paths of anything that might make someone trip, such as rocks or tools. Regularly check to see if handrails are loose or broken. Make sure that both sides of any steps have handrails. Any raised decks and porches should have guardrails on the edges. Have any leaves, snow, or ice cleared regularly. Use sand or salt on walking paths during winter. Clean up any spills in your garage right away. This includes oil or grease spills. What can I do in the bathroom? Use night lights. Install grab bars by the toilet and in the tub and shower. Do not use towel bars as grab bars. Use non-skid mats or decals in the tub or shower. If you need to sit down in the shower, use a plastic, non-slip stool. Keep the floor dry. Clean up any water that spills on the floor as soon as it happens. Remove soap buildup in the tub or shower regularly. Attach bath mats securely with double-sided non-slip rug tape. Do not have throw rugs and other things on the floor that can make you trip. What can I do in the bedroom? Use night lights. Make sure  that you have a light by your bed that is easy to reach. Do not use any sheets or blankets that are too big for your bed. They should not hang down onto the floor. Have a firm chair that has side arms. You can use this for support while you get dressed. Do not have throw rugs and other things on the floor that can make you trip. What can I do in the kitchen? Clean up any spills right away. Avoid walking on wet floors. Keep items that you use a lot in easy-to-reach places. If you need to reach something above you, use a strong step stool that has a grab bar. Keep electrical cords out of the way. Do not use floor polish or wax that makes floors slippery. If you must use wax, use non-skid floor wax. Do not have throw rugs and other things on the floor that can make you trip. What can I do with my stairs? Do not leave any items on the stairs. Make sure that there are handrails on both sides of the stairs and use them. Fix handrails that are broken or loose. Make sure that handrails are as long as the stairways. Check any carpeting to make sure that it is firmly attached to the stairs. Fix any carpet that is loose or worn. Avoid having throw rugs at the top or bottom  of the stairs. If you do have throw rugs, attach them to the floor with carpet tape. Make sure that you have a light switch at the top of the stairs and the bottom of the stairs. If you do not have them, ask someone to add them for you. What else can I do to help prevent falls? Wear shoes that: Do not have high heels. Have rubber bottoms. Are comfortable and fit you well. Are closed at the toe. Do not wear sandals. If you use a stepladder: Make sure that it is fully opened. Do not climb a closed stepladder. Make sure that both sides of the stepladder are locked into place. Ask someone to hold it for you, if possible. Clearly mark and make sure that you can see: Any grab bars or handrails. First and last steps. Where the edge of  each step is. Use tools that help you move around (mobility aids) if they are needed. These include: Canes. Walkers. Scooters. Crutches. Turn on the lights when you go into a dark area. Replace any light bulbs as soon as they burn out. Set up your furniture so you have a clear path. Avoid moving your furniture around. If any of your floors are uneven, fix them. If there are any pets around you, be aware of where they are. Review your medicines with your doctor. Some medicines can make you feel dizzy. This can increase your chance of falling. Ask your doctor what other things that you can do to help prevent falls. This information is not intended to replace advice given to you by your health care provider. Make sure you discuss any questions you have with your health care provider. Document Released: 07/10/2009 Document Revised: 02/19/2016 Document Reviewed: 10/18/2014 Elsevier Interactive Patient Education  2017 [employer].

## 2021-04-09 ENCOUNTER — Ambulatory Visit
Admission: RE | Admit: 2021-04-09 | Discharge: 2021-04-09 | Disposition: A | Discharge status: Home / Self Care | Payer: Medicare Other | Source: Ambulatory Visit | Attending: Family Medicine | Admitting: Family Medicine

## 2021-04-09 ENCOUNTER — Other Ambulatory Visit: Payer: Self-pay

## 2021-04-09 DIAGNOSIS — Z1231 Encounter for screening mammogram for malignant neoplasm of breast: Secondary | ICD-10-CM

## 2021-04-13 ENCOUNTER — Ambulatory Visit: Payer: Medicare Other

## 2021-04-29 ENCOUNTER — Telehealth (HOSPITAL_BASED_OUTPATIENT_CLINIC_OR_DEPARTMENT_OTHER): Payer: Self-pay | Admitting: Cardiovascular Disease

## 2021-04-29 NOTE — Telephone Encounter (Signed)
Spoke with patient and advised I did not try to call  She was glad that I called because she has been having swelling in her legs and feet for couple weeks now, does not go down at night Is having increased shortness of breath, thinks her inhaler helps  Watches her salt intake  Scheduled her appointment with Overton Mam NP for tomorrow Patient aware of date, time and location

## 2021-04-29 NOTE — Telephone Encounter (Signed)
Patient states he was returning call. Please advise ?

## 2021-04-29 NOTE — Progress Notes (Signed)
Office Visit    Patient Name: Catherine [name] Date of Encounter: 04/30/2021  PCP:  [name], MD   [facility]  Cardiologist:  [name], MD  Advanced Practice Provider:  No care team member to display Electrophysiologist:  None   Chief Complaint    Catherine [name] is a 86 y.o. 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 09/2016, chronic atrial fibrillation with bilateral MAZE, chronic diastolic heart failure, aortic stenosis, COPD, asthma, HTN, HLD presents today for edema   Past Medical History    Past Medical History:  Diagnosis Date   Acute on chronic diastolic heart failure ([place])    Aortic stenosis, mild 07/26/2017   Arthritis    Asthma    last attack 02/2015   Atrial fibrillation, chronic ([place])    Cellulitis of left lower extremity 08/17/2016   Ulcer associated with severe venous insufficiency   Chronic anticoagulation    Chronic diastolic CHF (congestive heart failure) ([place])    Chronic kidney disease    "RIGHT MANY KIDNEY INFECTIONS AND STONES"   COPD (chronic obstructive pulmonary disease) ([place])    Coronary artery disease    Dizziness    H/O: rheumatic fever    Heart murmur    Hypertension    PONV (postoperative nausea and vomiting)    ' SOMETIMES', BUT NOT ALWAYS"   S/P Maze operation for atrial fibrillation 10/14/2016   Complete bilateral atrial lesion set using cryothermy and bipolar radiofrequency ablation - atrial appendage was not treated due to previous surgical procedure (open mitral commissurotomy)   S/P mitral valve replacement with bioprosthetic valve 10/14/2016   29 mm Medtronic Mosaic porcine bioprosthetic tissue valve   UTI (urinary tract infection)    Valvular heart disease    Has mitral stenosis with prior mitral commissurotomy in 1970   Past Surgical History:  Procedure Laterality Date   ABDOMINAL HYSTERECTOMY  1983   endometriosis    APPENDECTOMY     BACK SURGERY     neurosurgery x2   CARDIAC CATHETERIZATION     CARDIAC CATHETERIZATION N/A 08/03/2016   Procedure: Right/Left Heart Cath and Coronary Angiography;  Surgeon: Peter M Martinique, MD;  Location: [facility];  Service: Cardiovascular;  Laterality: N/A;   cataract surg     CHOLECYSTECTOMY N/A 05/30/2015   Procedure: LAPAROSCOPIC CHOLECYSTECTOMY WITH INTRAOPERATIVE CHOLANGIOGRAM;  Surgeon: Excell Seltzer, MD;  Location: [facility];  Service: General;  Laterality: N/A;   COLONOSCOPY     CORONARY ARTERY BYPASS GRAFT N/A 10/14/2016   Procedure: CORONARY ARTERY BYPASS GRAFTING (CABG);  Surgeon: Rexene Alberts, MD;  Location: [facility];  Service: Open Heart Surgery;  Laterality: N/A;   EYE SURGERY     MAZE N/A 10/14/2016   Procedure: MAZE;  Surgeon: Rexene Alberts, MD;  Location: [facility];  Service: Open Heart Surgery;  Laterality: N/A;   MITRAL VALVE REPLACEMENT N/A 10/14/2016   Procedure: REDO MITRAL VALVE REPLACEMENT (MVR);  Surgeon: Rexene Alberts, MD;  Location: [facility];  Service: Open Heart Surgery;  Laterality: N/A;   MITRAL VALVE SURGERY Left 1970   Open mitral commissurotomy via left thoracotomy approach   TEE WITHOUT CARDIOVERSION N/A 07/05/2016   Procedure: TRANSESOPHAGEAL ECHOCARDIOGRAM (TEE);  Surgeon: [name], MD;  Location: [facility];  Service: Cardiovascular;  Laterality: N/A;   TEE WITHOUT CARDIOVERSION N/A 10/14/2016   Procedure: TRANSESOPHAGEAL ECHOCARDIOGRAM (TEE);  Surgeon: Valentina Gu  Roxy Manns, MD;  Location: [facility];  Service: Open Heart Surgery;  Laterality: N/A;   Allergies  Allergies  Allergen Reactions   Aldactone [Spironolactone] Other (See Comments)    dyspnea   Amoxicillin Palpitations    Tachycardia Has patient had a PCN reaction causing immediate rash, facial/tongue/throat swelling, SOB or lightheadedness with hypotension: no Has patient had a PCN reaction causing severe rash involving mucus membranes or skin necrosis: {no Has patient  had a PCN reaction that required hospitalization {no Has patient had a PCN reaction occurring within the last 10 years: {yes If all of the above answers are "NO", then may proceed with Cephalosporin use.   Diltiazem Other (See Comments)    Causing headaches    Flagyl [Metronidazole Hcl] Other (See Comments)    Causing headaches     Flovent [Fluticasone Propionate] Other (See Comments)    Leg cramps   Gabapentin Swelling   Lyrica [Pregabalin] Swelling   Quinidine Diarrhea and Other (See Comments)    Fever diarrhea   Simvastatin Other (See Comments)    Leg pain, myalgia   Tramadol Nausea Only   Verapamil Other (See Comments)    myalgias   Amlodipine     Low extremity edema   Crestor [Rosuvastatin]     myalgia   Livalo [Pitavastatin]     myalgias   Zetia [Ezetimibe]     LEG CRAMPS    Ace Inhibitors Other (See Comments)    unknown   Benazepril Hcl Cough   Ciprocin-Fluocin-Procin [Fluocinolone Acetonide] Other (See Comments)    unknown   Ciprofloxacin Diarrhea   Codeine Nausea Only   Nitrofurantoin Monohyd Macro Nausea Only    History of Present Illness    Catherine [name] is a 86 y.o. 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  History of rheumatic MV disease and underwent mitral commissurotomy in the 1970s. 09/2017 she underwent bioprosthetic MVR, CABGx1, and bilateral MAZE procedure with Dr. Roxy Manns.  Echo 09/2019 LVEF 65-70%, mild LVH, no RWMA, bioprosthetic mitral valve functioning appropriately, mild AS, mild pulmonic regurgitation.   She has been evaluated by atrial fibrillation clinic for question of atrial fibrillation vs long PR which remained unclear. However, it was decided that NSR would not be possible to maintain if she were in  atrial fib.   She has had persistent fatigue since her surgery. She was seen 11/2020 by Dr. Oval Linsey with improvement in abdominal bloating after transition from Lasix to Torsemide. Her persistent LE edema was thought to be due to venous insufficiency. Unable to tolerate compression socks and declined referral to [facility].  She was seen 02/24/21 by Sande Rives, PA. She was overall stable with chronic shortness of breath. Her LE edema was stable and worse in the evening. She reported mild dizziness which was fleeting and self resolved. As it was improving, no intervention was recommended.   She presents today for follow up with chief complaint of worsening LE edema. Notes lower extremity edema has been worsening since the spring and summer. Her weight is up 5 pounds over the last month. She takes 2 Torsemide in the morning and 2 Torsemide after dinner around 5:30 PM consistently as prescribed. She denies nocturia. Tells me he abdominal bloating has not recurred.   She goes to [employer] on Mondays  and Wednesdays. She is very active around the home. She does try to sit with her feet up. She has worn compression socks in the past - tells me "they do good for awhile" but then get swollen in the evening. Has trouble getting on the medical strength ones even with her husband's help.  She does not eat salt. Drinks mostly water throughout the day. She will drink half a Dr. Malachi Bonds or a glass of tea. Drinks water out of a styrofoam cup throughout the day about 3-4 large cups.   Tells me her breathing is "bad" over the last month. Reports shortness of breath at rest and with activity. She has been using her inhalers as prescribed. Reports no hematuria nor melena. Reports spells of "going to pass out" which is difficult to describe but sounds like lightheadedness.   EKGs/Labs/Other Studies Reviewed:   The following studies were reviewed today:   Right/Left Cardiac Catheterization 08/03/2016: The left  ventricular systolic function is normal. LV end diastolic pressure is normal. The left ventricular ejection fraction is 55-65% by visual estimate. There is no aortic valve stenosis. There is severe mitral valve stenosis. Prox Cx to Mid Cx lesion, 50 %stenosed. Mid RCA-2 lesion, 80 %stenosed. Mid RCA-1 lesion, 80 %stenosed. Hemodynamic findings consistent with mild pulmonary hypertension. LV end diastolic pressure is normal.   1. Coronary artery disease   - 50% mid LCx   - 80% sequential lesions in the mid RCA 2. Normal LV function 3. Severe mitral stenosis. MV gradient of 13 mm Hg. MVA 0.99 cm squared with index 0.57. 4. Moderate to severe mitral insufficiency 5. Mild pulmonary HTN. 6. Normal LV EDP 7. No significant AV gradient 8. Occluded right brachial artery.   Plan: surgical evaluation for MVR and CABG. _______________   Echocardiogram 10/15/2019: Impressions: 1. Left ventricular ejection fraction, by visual estimation, is 65 to  70%. The left ventricle has hyperdynamic function. Left ventricular septal  wall thickness was mildly increased. There is mildly increased left  ventricular hypertrophy.   2. The left ventricle has no regional wall motion abnormalities.   3. Global right ventricle has normal systolic function.The right  ventricular size is normal. No increase in right ventricular wall  thickness.   4. Left atrial size was moderately dilated.   5. Right atrial size was moderately dilated.   6. The mitral valve has been repaired/replaced. No evidence of mitral  valve regurgitation. No evidence of mitral stenosis.   7. Patient has a 29 mm biprosthetic valve functioning normally with no  PVL The mitral struts protrude into the LVOT quite a bit likely  contributing to some of the gradient across the LVOT/AV. Creates a small  "neointimal LVOT" area.   8. The tricuspid valve is normal in structure.   9. The aortic valve is tricuspid. Aortic valve regurgitation is not   visualized. Mild aortic valve stenosis.  10. Pulmonic regurgitation is mild.  11. The pulmonic valve was grossly normal. Pulmonic valve regurgitation is  mild.  12. Mildly elevated pulmonary artery systolic pressure.  EKG:  EKG is ordered today.  The ekg ordered today demonstrates atrial fibrillation 57 bpm with no acute ST/T wave changes.   Recent Labs: 07/09/2020: Pro B Natriuretic peptide (BNP) 253.0; TSH 3.95 11/27/2020: BNP 129.8; BUN 14; Creatinine, Ser 0.92; Hemoglobin 13.1; Magnesium 2.2; Platelets 173; Potassium 3.5; Sodium 147 03/19/2021: ALT 20  Recent Lipid Panel    Component Value Date/Time   CHOL 153 03/19/2021 1023   TRIG 105  03/19/2021 1023   HDL 54 03/19/2021 1023   CHOLHDL 2.8 03/19/2021 1023   CHOLHDL 2.8 07/09/2020 1403   VLDL 42.8 (H) 09/24/2019 1054   LDLCALC 80 03/19/2021 1023   LDLCALC 77 07/09/2020 1403   LDLDIRECT 68.0 09/24/2019 1054    Risk Assessment/Calculations:   CHA2DS2-VASc Score = 6  This indicates a 9.7% annual risk of stroke. The patient's score is based upon: CHF History: Yes HTN History: Yes Diabetes History: No Stroke History: No Vascular Disease History: Yes Age Score: 2 Gender Score: 1   Home Medications   Current Meds  Medication Sig   acetaminophen (TYLENOL) 500 MG tablet Take 500 mg by mouth daily.   albuterol (VENTOLIN HFA) 108 (90 Base) MCG/ACT inhaler Inhale 2 puffs into the lungs every 4 (four) hours as needed for wheezing or shortness of breath.   cephALEXin (KEFLEX) 250 MG capsule TAKE ONE CAPSULE ONCE DAILY   cholecalciferol (VITAMIN D3) 25 MCG (1000 UNIT) tablet Take 1 tablet (1,000 Units total) by mouth daily.   diazepam (VALIUM) 5 MG tablet TAKE ONE TABLET AT BEDTIME   doxazosin (CARDURA) 8 MG tablet Take 1 tablet (8 mg total) by mouth daily.   metoprolol succinate (TOPROL-XL) 25 MG 24 hr tablet TAKE 1 TABLET DAILY   Multiple Vitamins-Minerals (HAIR/SKIN/NAILS/BIOTIN PO) Take 1 tablet by mouth daily.   potassium  chloride (KLOR-CON) 10 MEQ tablet TAKE 8 TABLETS DAILY   rosuvastatin (CRESTOR) 5 MG tablet Take one tablet every Monday, wednesday Friday and Sunday   SYMBICORT 80-4.5 MCG/ACT inhaler 2 PUFFS 2 TIMES A DAY   torsemide (DEMADEX) 20 MG tablet TAKE (2) TABLETS TWICE DAILY.   warfarin (COUMADIN) 2 MG tablet Take 1 tablet daily or take as directed by anticoagulation clinic     Review of Systems      All other systems reviewed and are otherwise negative except as noted above.  Physical Exam    VS:  BP (!) 160/70   Pulse (!) 57   Ht '5\' 3"'$  (1.6 m)   Wt 157 lb (71.2 kg)   SpO2 96%   BMI 27.81 kg/m  , BMI Body mass index is 27.81 kg/m.  Wt Readings from Last 3 Encounters:  04/30/21 157 lb (71.2 kg)  04/01/21 153 lb 6.4 oz (69.6 kg)  03/10/21 153 lb (69.4 kg)    GEN: Well nourished, well developed, in no acute distress. HEENT: normal. Neck: Supple, no JVD, carotid bruits, or masses. Cardiac: IRIR, no  rubs, or gallops. Gr 2/6 systolic murmur. No clubbing, cyanosis, edema.  Radials/PT 2+ and equal bilaterally.  Respiratory:  Respirations regular and unlabored, clear to auscultation bilaterally. GI: Soft, nontender, nondistended. MS: No deformity or atrophy. Skin: Warm and dry, no rash. Neuro:  Strength and sensation are intact. Psych: Normal affect.  Assessment & Plan    CAD s/p CAG - Stable with no anginal symptoms. No indication for ischemic evaluation.  GDMT includes statin, beta-blocker.  No aspirin due to chronic anticoagulation. Heart healthy diet and regular cardiovascular exercise encouraged.    Rheumatic mitral valve disease s/p bioprosthetic MVR -functioning appropriately on previous echocardiogram.  Continue optimal BP and volume control  Chronic anticoagulation / Chronic atrial fibrillation - Rate controlled by EKG today. Continue warfarin due to CHA2DS2-VASc of at least 6 (agex2, gender, CHF, HTN, MI). Denies bleeding complications.   Chronic diastolic heart failure -  Echo 09/2019 LVEF 65-70%, mild LVH.  Volume overloaded with weight gain of approximately 5 pounds.  Increase torsemide to  60 mg twice daily for 3 days return to 40 mg twice daily. To prevent hypokalemia, add additional 62mq at lunchtime of potassium. BMP, BNP 1 week.  Aortic stenosis - Mild AS by echo 09/2019.  Update echocardiogram for monitoring given report of worsening lightheadedness.  Carotid artery stenosis - bilateral 1-39% stenosis. Continue optimal cholesterol control. No aspirin due to chronic anticoagulation. Repeat imaging as clinically indicated.   Dizziness/Lightheadedness -update echocardiogram, as above. encouraged to make position changes slowly and stay well-hydrated.  HTN -BP mildly elevated today though routinely well controlled at other office visits. Anticipate elevated blood pressure due to elevated volume status.  Continue to follow at next visit.  HLD - Following with lipid clinic. Rosuvastatin increased to 4x/week end of June.  Tolerating without myalgias  Disposition: Follow up in 3 week(s) with CLoel Dubonnet NP and in October as scheduled with Dr. ROval Linsey Signed, CLoel Dubonnet NP 04/30/2021, 9:47 AM [place]

## 2021-04-30 ENCOUNTER — Other Ambulatory Visit: Payer: Self-pay

## 2021-04-30 ENCOUNTER — Ambulatory Visit (HOSPITAL_BASED_OUTPATIENT_CLINIC_OR_DEPARTMENT_OTHER): Payer: Medicare Other | Admitting: Family

## 2021-04-30 ENCOUNTER — Encounter (HOSPITAL_BASED_OUTPATIENT_CLINIC_OR_DEPARTMENT_OTHER): Payer: Self-pay | Admitting: Family

## 2021-04-30 VITALS — BP 142/62 | HR 57 | Ht 63.0 in | Wt 157.0 lb

## 2021-04-30 DIAGNOSIS — I25118 Atherosclerotic heart disease of native coronary artery with other forms of angina pectoris: Secondary | ICD-10-CM | POA: Diagnosis not present

## 2021-04-30 DIAGNOSIS — I872 Venous insufficiency (chronic) (peripheral): Secondary | ICD-10-CM

## 2021-04-30 DIAGNOSIS — Z951 Presence of aortocoronary bypass graft: Secondary | ICD-10-CM

## 2021-04-30 DIAGNOSIS — Z953 Presence of xenogenic heart valve: Secondary | ICD-10-CM | POA: Diagnosis not present

## 2021-04-30 DIAGNOSIS — Z7901 Long term (current) use of anticoagulants: Secondary | ICD-10-CM | POA: Diagnosis not present

## 2021-04-30 DIAGNOSIS — R600 Localized edema: Secondary | ICD-10-CM | POA: Diagnosis not present

## 2021-04-30 NOTE — Patient Instructions (Addendum)
Medication Instructions:  Your physician has recommended you make the following change in your medication:   Today:  Take Torsemide 3 tablets this evening  For Friday, Saturday, Sunday:  CHANGE Torsemide to 3 tablets in the morning and 3 tablets in the evening CHANGE Potassium to 4 tablets in the morning, 2 tablets at lunch time, and 4 tablets in the evening  On Monday - call our office and let us know how your swelling and fluid are doing.   On Monday:  CHANGE Torsemide to 2 tablets in the morning and 2 tablets in the evening CHANGE Potassium to 4 tablets in the morning and 4 tablets in the evening  *If you need a refill on your cardiac medications before your next appointment, please call your pharmacy*   Lab Work: Your physician recommends that you return for lab work in: 1 week for BMP, BNP   If you have labs (blood work) drawn today and your tests are completely normal, you will receive your results only by: [place] (if you have [place]) OR A paper copy in the mail If you have any lab test that is abnormal or we need to change your treatment, we will call you to review the results.   Testing/Procedures: Your physician has requested that you have an echocardiogram. Echocardiography is a painless test that uses sound waves to create images of your heart. It provides your doctor with information about the size and shape of your heart and how well your heart's chambers and valves are working. This procedure takes approximately one hour. There are no restrictions for this procedure.    Follow-Up: At [facility], you and your health needs are our priority.  As part of our continuing mission to provide you with exceptional heart care, we have created designated Provider Care Teams.  These Care Teams include your primary Cardiologist (physician) and Advanced Practice Providers (APPs -  Physician Assistants and Nurse Practitioners) who all work together to provide you with  the care you need, when you need it.  We recommend signing up for the patient portal called "MyChart".  Sign up information is provided on this After Visit Summary.  MyChart is used to connect with patients for Virtual Visits (Telemedicine).  Patients are able to view lab/test results, encounter notes, upcoming appointments, etc.  Non-urgent messages can be sent to your provider as well.   To learn more about what you can do with MyChart, go to [email or web address].    Your next appointment:   In 3-4 weeks with Loel Dubonnet, NP AND In October as scheduled with Dr. Oval Linsey  Other Instructions  To prevent or reduce lower extremity swelling: Eat a low salt diet. Salt makes the body hold onto extra fluid which causes swelling. Sit with legs elevated. For example, in the recliner or on an [place].  Wear knee-high compression stockings during the daytime. Ones labeled 15-20 mmHg provide good compression.   Heart Healthy Diet Recommendations: A low-salt diet is recommended. Meats should be grilled, baked, or boiled. Avoid fried foods. Focus on lean protein sources like fish or chicken with vegetables and fruits. The American Heart Association is a Microbiologist!    Exercise recommendations: The American Heart Association recommends 150 minutes of moderate intensity exercise weekly. Try 30 minutes of moderate intensity exercise 4-5 times per week. This could include walking, jogging, or swimming.  Recommend weighing daily and keeping a log. Please call our office if you have weight gain of 2 pounds overnight  or 5 pounds in 1 week.   Date  Time Weight                                           Chronic Venous Insufficiency Chronic venous insufficiency is a condition where the leg veins cannot effectively pump blood from the legs to the heart. This happens when the vein walls are either stretched, weakened, or damaged, or when the valves inside the vein are damaged. With  the right treatment, you should be able to continue withan active life. This condition is also called venous stasis. What are the causes? Common causes of this condition include: High blood pressure inside the veins (venous hypertension). Sitting or standing too long, causing increased blood pressure in the leg veins. A blood clot that blocks blood flow in a vein (deep vein thrombosis, DVT). Inflammation of a vein (phlebitis) that causes a blood clot to form. Tumors in the pelvis that cause blood to back up. What increases the risk? The following factors may make you more likely to develop this condition: Having a family history of this condition. Obesity. Pregnancy. Living without enough regular physical activity or exercise (sedentary lifestyle). Smoking. Having a job that requires long periods of standing or sitting in one place. Being a certain age. Women in their 68s and 18s and men in their 29s are more likely to develop this condition. What are the signs or symptoms? Symptoms of this condition include: Veins that are enlarged, bulging, or twisted (varicose veins). Skin breakdown or ulcers. Reddened skin or dark discoloration of skin on the leg between the knee and ankle. Brown, smooth, tight, and painful skin just above the ankle, usually on the inside of the leg (lipodermatosclerosis). Swelling of the legs. How is this diagnosed? This condition may be diagnosed based on: Your medical history. A physical exam. Tests, such as: A procedure that creates an image of a blood vessel and nearby organs and provides information about blood flow through the blood vessel (duplex ultrasound). A procedure that tests blood flow (plethysmography). A procedure that looks at the veins using X-ray and dye (venogram). How is this treated? The goals of treatment are to help you return to an active life and to minimize pain or disability. Treatment depends on the severity of your condition, and it  may include: Wearing compression stockings. These can help relieve symptoms and help prevent your condition from getting worse. However, they do not cure the condition. Sclerotherapy. This procedure involves an injection of a solution that shrinks damaged veins. Surgery. This may involve: Removing a diseased vein (vein stripping). Cutting off blood flow through the vein (laser ablation surgery). Repairing or reconstructing a valve within the affected vein. Follow these instructions at home:     Wear compression stockings as told by your health care provider. These stockings help to prevent blood clots and reduce swelling in your legs. Take over-the-counter and prescription medicines only as told by your health care provider. Stay active by exercising, walking, or doing different activities. Ask your health care provider what activities are safe for you and how much exercise you need. Drink enough fluid to keep your urine pale yellow. Do not use any products that contain nicotine or tobacco, such as cigarettes, e-cigarettes, and chewing tobacco. If you need help quitting, ask your health care provider. Keep all follow-up visits as told by your health  care provider. This is important. Contact a health care provider if you: Have redness, swelling, or more pain in the affected area. See a red streak or line that goes up or down from the affected area. Have skin breakdown or skin loss in the affected area, even if the breakdown is small. Get an injury in the affected area. Get help right away if: You get an injury and an open wound in the affected area. You have: Severe pain that does not get better with medicine. Sudden numbness or weakness in the foot or ankle below the affected area. Trouble moving your foot or ankle. A fever. Worse or persistent symptoms. Chest pain. Shortness of breath. Summary Chronic venous insufficiency is a condition where the leg veins cannot effectively pump  blood from the legs to the heart. Chronic venous insufficiency occurs when the vein walls become stretched, weakened, or damaged, or when valves within the vein are damaged. Treatment depends on how severe your condition is. It often involves wearing compression stockings and may involve having a procedure. Make sure you stay active by exercising, walking, or doing different activities. Ask your health care provider what activities are safe for you and how much exercise you need. This information is not intended to replace advice given to you by your health care provider. Make sure you discuss any questions you have with your healthcare provider. Document Revised: 06/06/2018 Document Reviewed: 06/06/2018 Elsevier Patient Education  [place].

## 2021-05-04 ENCOUNTER — Ambulatory Visit (INDEPENDENT_AMBULATORY_CARE_PROVIDER_SITE_OTHER): Payer: Medicare Other | Admitting: General Practice

## 2021-05-04 ENCOUNTER — Other Ambulatory Visit: Payer: Self-pay

## 2021-05-04 ENCOUNTER — Telehealth: Payer: Self-pay | Admitting: Cardiovascular Disease

## 2021-05-04 DIAGNOSIS — Z7901 Long term (current) use of anticoagulants: Secondary | ICD-10-CM | POA: Diagnosis not present

## 2021-05-04 LAB — POCT INR: INR: 1.8 — AB (ref 2.0–3.0)

## 2021-05-04 NOTE — Patient Instructions (Addendum)
Pre visit review using our clinic review tool, if applicable. No additional management support is needed unless otherwise documented below in the visit note.  Increase dose today to 1 and 1/2 tablets and then continue to take 1 tablet daily except 1/2 tablet on Wednesday and Saturday.  Re-check in 4 weeks.

## 2021-05-04 NOTE — Telephone Encounter (Signed)
Pt updated with NP's recommendations and verbalized understanding. Pt also confirmed she is back taking torsemide 40 mg BID.

## 2021-05-04 NOTE — Telephone Encounter (Signed)
Pt called in stated that she was call back to day and let Catlin walker know about how the med changes did for her.  She stated there was big difference  over the weekend. She stated she  Lost 4 pounds and her legs have gone down a lot.   She feels so much better.  Just fyi   Best number [phone] if needed

## 2021-05-04 NOTE — Telephone Encounter (Signed)
Thank you for the update! She took Torsemide '60mg'$  BID x3 days then was to return to Torsemide '40mg'$  BID per last office visit instructions. She should contact our office for weight gain of 2 pounds overnight or 5 pounds in one week.   Loel Dubonnet, NP

## 2021-05-07 DIAGNOSIS — R600 Localized edema: Secondary | ICD-10-CM | POA: Diagnosis not present

## 2021-05-08 LAB — BASIC METABOLIC PANEL
BUN/Creatinine Ratio: 15 (ref 12–28)
BUN: 13 mg/dL (ref 8–27)
CO2: 29 mmol/L (ref 20–29)
Calcium: 9.4 mg/dL (ref 8.7–10.3)
Chloride: 102 mmol/L (ref 96–106)
Creatinine, Ser: 0.86 mg/dL (ref 0.57–1.00)
Glucose: 96 mg/dL (ref 65–99)
Potassium: 4 mmol/L (ref 3.5–5.2)
Sodium: 147 mmol/L — ABNORMAL HIGH (ref 134–144)
eGFR: 67 mL/min/{1.73_m2} (ref >59)

## 2021-05-08 LAB — BRAIN NATRIURETIC PEPTIDE: BNP: 143.1 pg/mL — ABNORMAL HIGH (ref 0.0–100.0)

## 2021-05-11 ENCOUNTER — Other Ambulatory Visit: Payer: Self-pay | Admitting: Family Medicine

## 2021-05-11 NOTE — Telephone Encounter (Signed)
Last filled 11/17/2020 Last OV 03/10/2021  Ok to fill?

## 2021-05-13 ENCOUNTER — Other Ambulatory Visit: Payer: Self-pay | Admitting: Cardiovascular Disease

## 2021-05-13 ENCOUNTER — Telehealth: Payer: Self-pay | Admitting: Pharmacist

## 2021-05-13 NOTE — Chronic Care Management (AMB) (Signed)
Chronic Care Management Pharmacy Assistant   Name: ARDEEN NOSKO  MRN: [id number] DOB: [birth year]  Reason for Encounter: Disease State/ General Assessment Call.   Conditions to be addressed/monitored: HTN and HLD   Recent office visits:  None.  Recent consult visits:  04/30/21 [name] NP (Cardiology) - seen for CAD and other issues. No medication changes. Follow up in 1 week for labs and 3-4 weeks with [name] NP and october with Dr. Oval Linsey.   04/01/21 Christinia Gully MD (Pulmonary Disease) - seen for COPD GOLD II. Decreased Cephalexin from '500mg'$  3 times a day to '250mg'$  daily. Follow up in 12 months or sooner if needed.   Hospital visits:  None in previous 6 months  Medications: Outpatient Encounter Medications as of 05/13/2021  Medication Sig   acetaminophen (TYLENOL) 500 MG tablet Take 500 mg by mouth daily.   albuterol (VENTOLIN HFA) 108 (90 Base) MCG/ACT inhaler Inhale 2 puffs into the lungs every 4 (four) hours as needed for wheezing or shortness of breath.   cephALEXin (KEFLEX) 250 MG capsule TAKE ONE CAPSULE ONCE DAILY   cholecalciferol (VITAMIN D3) 25 MCG (1000 UNIT) tablet Take 1 tablet (1,000 Units total) by mouth daily.   diazepam (VALIUM) 5 MG tablet TAKE ONE TABLET AT BEDTIME   doxazosin (CARDURA) 8 MG tablet Take 1 tablet (8 mg total) by mouth daily.   metoprolol succinate (TOPROL-XL) 25 MG 24 hr tablet TAKE 1 TABLET DAILY   Multiple Vitamins-Minerals (HAIR/SKIN/NAILS/BIOTIN PO) Take 1 tablet by mouth daily.   potassium chloride (KLOR-CON) 10 MEQ tablet TAKE 8 TABLETS DAILY   rosuvastatin (CRESTOR) 5 MG tablet Take one tablet every Monday, wednesday Friday and Sunday   SYMBICORT 80-4.5 MCG/ACT inhaler 2 PUFFS 2 TIMES A DAY   torsemide (DEMADEX) 20 MG tablet TAKE (2) TABLETS TWICE DAILY.   warfarin (COUMADIN) 2 MG tablet Take 1 tablet daily or take as directed by anticoagulation clinic   No facility-administered encounter medications on file as of  05/13/2021.   Fill History: Symbicort 80 mcg-4.5 mcg/actuation HFA aerosol inhaler 04/17/2021 30   cephalexin 250 mg capsule 04/11/2021 30   doxazosin 8 mg tablet 04/06/2021 90   metoprolol succinate ER 25 mg tablet,extended release 24 hr 04/06/2021 90   potassium chloride ER 10 mEq tablet,extended release 04/17/2021 90   rosuvastatin 5 mg tablet 03/20/2021 58   torsemide 20 mg tablet 04/07/2021 45   warfarin 2 mg tablet 02/03/2021 90   diazepam 5 mg tablet 04/11/2021 30   Reviewed chart prior to disease state call. Spoke with patient regarding BP  Recent Office Vitals: BP Readings from Last 3 Encounters:  04/30/21 (!) 142/62  04/01/21 110/70  03/10/21 (!) 142/70   Pulse Readings from Last 3 Encounters:  04/30/21 (!) 57  04/01/21 (!) 56  03/10/21 72    Wt Readings from Last 3 Encounters:  04/30/21 157 lb (71.2 kg)  04/01/21 153 lb 6.4 oz (69.6 kg)  03/10/21 153 lb (69.4 kg)     Kidney Function Lab Results  Component Value Date/Time   CREATININE 0.86 05/07/2021 11:29 AM   CREATININE 0.92 11/27/2020 02:39 PM   CREATININE 0.94 (H) 07/29/2020 09:50 AM   CREATININE 0.71 07/09/2020 02:03 PM   GFR 67.66 10/01/2019 03:03 PM   GFRNONAA 59 (L) 10/29/2020 12:10 PM   GFRAA 67 10/29/2020 12:10 PM    BMP Latest Ref Rng & Units 05/07/2021 11/27/2020 10/29/2020  Glucose 65 - 99 mg/dL 96 89 120(H)  BUN 8 -  27 mg/dL '13 14 16  '$ Creatinine 0.57 - 1.00 mg/dL 0.86 0.92 0.91  BUN/Creat Ratio 12 - '28 15 15 18  '$ Sodium 134 - 144 mmol/L 147(H) 147(H) 144  Potassium 3.5 - 5.2 mmol/L 4.0 3.5 3.5  Chloride 96 - 106 mmol/L 102 104 101  CO2 20 - 29 mmol/L '29 23 26  '$ Calcium 8.7 - 10.3 mg/dL 9.4 9.4 9.3    Current antihypertensive regimen:  Doxazosin '8mg'$  - take 1 tablet by mouth daily. Metoprolol '25mg'$  - take 1 tablet daily How often are you checking your Blood Pressure? infrequently Current home BP readings: last week was 152/60 P:61 What recent interventions/DTPs have been made by any  provider to improve Blood Pressure control since last CPP Visit: None. Any recent hospitalizations or ED visits since last visit with CPP? No  Adherence Review: Is the patient currently on ACE/ARB medication? No Does the patient have >5 day gap between last estimated fill dates? No   Comprehensive medication review performed; Spoke to patient regarding cholesterol  Lipid Panel    Component Value Date/Time   CHOL 153 03/19/2021 1023   TRIG 105 03/19/2021 1023   HDL 54 03/19/2021 1023   LDLCALC 80 03/19/2021 1023   LDLCALC 77 07/09/2020 1403   LDLDIRECT 68.0 09/24/2019 1054    10-year ASCVD risk score: The ASCVD Risk score Mikey Bussing DC Jr., et al., 2013) failed to calculate for the following reasons:   The 2013 ASCVD risk score is only valid for ages 72 to 66  Current antihyperlipidemic regimen:  Rosuvastatin '5mg'$  - take 1 tablet every Monday, Wednesday, Friday and Sunday. Previous antihyperlipidemic medications tried: None. ASCVD risk enhancing conditions: age >59, HTN, and CHF What recent interventions/DTPs have been made by any provider to improve Cholesterol control since last CPP Visit: None. Any recent hospitalizations or ED visits since last visit with CPP? No  Adherence Review: Does the patient have >5 day gap between last estimated fill dates? No  Notes: Spoke with patient and reviewed all medications as prescribed above. Patient reports taking all medications and no issues at this time. Patient states she is taking her blood pressure infrequently but about once a week mostly. Patient attends silver sneaker twice a week and walks daily. Patient still does housework and yard work and gets around fine with no restrictions. Patient stated her eating is the same its always been and she doesn't stray from her typical. She gets plenty of serving of fruits and vegetables a day. Salmon about twice a month for her lipids. She does not add salt to her food unless its a tomato and she drinks  plenty of water. She does not eat red meat hardly at all. She prefers chicken or fish. She is mindful of not eating greasy or salty foods. Patient stated she snacks throughout the day. Patient states her back has been hurting her on her right side lately but she thinks its because she ad a kidney stone recently and then working out she may have bent the wrong way. Patient stated its been feeling a lot better each day. Patient thanked me for my call.  Care Gaps:  AWV - message sent to [name] to schedule Covid-19 vaccine booster 4 - overdue since 11/19/20 Flu vaccine - due  Star Rating Drugs:  Rosuvastatin '5mg'$  - last filled on 03/20/21 58DS at [facility].  [place]  Clinical Pharmacist Assistant [phone]

## 2021-05-14 ENCOUNTER — Telehealth: Payer: Self-pay | Admitting: Cardiovascular Disease

## 2021-05-14 MED ORDER — TORSEMIDE 20 MG PO TABS: 40.0000 mg | ORAL_TABLET | Freq: Two times a day (BID) | ORAL | 1 refills | Status: DC

## 2021-05-14 NOTE — Telephone Encounter (Signed)
  *  STAT* If patient is at the pharmacy, call can be transferred to refill team.   1. Which medications need to be refilled? (please list name of each medication and dose if known) torsemide (DEMADEX) 20 MG tablet  2. Which pharmacy/location (including street and city if local pharmacy) is medication to be sent to? [place], [place]  3. Do they need a 30 day or 90 day supply? 90 days, 360 tablets  Tressie Ellis calling back he said he received the same refill, he said pt needs 90 days supply if this meds

## 2021-05-22 ENCOUNTER — Ambulatory Visit (HOSPITAL_BASED_OUTPATIENT_CLINIC_OR_DEPARTMENT_OTHER): Payer: Medicare Other | Admitting: Family

## 2021-05-22 ENCOUNTER — Other Ambulatory Visit: Payer: Self-pay

## 2021-05-22 ENCOUNTER — Encounter (HOSPITAL_BASED_OUTPATIENT_CLINIC_OR_DEPARTMENT_OTHER): Payer: Self-pay | Admitting: Family

## 2021-05-22 VITALS — BP 152/82 | HR 60 | Ht 63.0 in | Wt 153.0 lb

## 2021-05-22 DIAGNOSIS — Z951 Presence of aortocoronary bypass graft: Secondary | ICD-10-CM | POA: Diagnosis not present

## 2021-05-22 DIAGNOSIS — I5032 Chronic diastolic (congestive) heart failure: Secondary | ICD-10-CM

## 2021-05-22 DIAGNOSIS — I25118 Atherosclerotic heart disease of native coronary artery with other forms of angina pectoris: Secondary | ICD-10-CM | POA: Diagnosis not present

## 2021-05-22 DIAGNOSIS — I872 Venous insufficiency (chronic) (peripheral): Secondary | ICD-10-CM

## 2021-05-22 DIAGNOSIS — Z953 Presence of xenogenic heart valve: Secondary | ICD-10-CM

## 2021-05-22 DIAGNOSIS — R600 Localized edema: Secondary | ICD-10-CM

## 2021-05-22 DIAGNOSIS — E785 Hyperlipidemia, unspecified: Secondary | ICD-10-CM | POA: Diagnosis not present

## 2021-05-22 LAB — HEPATIC FUNCTION PANEL
ALT: 16 IU/L (ref 0–32)
AST: 27 IU/L (ref 0–40)
Albumin: 4.9 g/dL — ABNORMAL HIGH (ref 3.6–4.6)
Alkaline Phosphatase: 115 IU/L (ref 44–121)
Bilirubin Total: 0.5 mg/dL (ref 0.0–1.2)
Bilirubin, Direct: 0.16 mg/dL (ref 0.00–0.40)
Total Protein: 6.9 g/dL (ref 6.0–8.5)

## 2021-05-22 LAB — LIPID PANEL
Chol/HDL Ratio: 2.6 ratio (ref 0.0–4.4)
Cholesterol, Total: 146 mg/dL (ref 100–199)
HDL: 56 mg/dL (ref >39)
LDL Chol Calc (NIH): 70 mg/dL (ref 0–99)
Triglycerides: 109 mg/dL (ref 0–149)
VLDL Cholesterol Cal: 20 mg/dL (ref 5–40)

## 2021-05-22 MED ORDER — TORSEMIDE 20 MG PO TABS: ORAL_TABLET | ORAL | 1 refills | Status: DC

## 2021-05-22 NOTE — Progress Notes (Signed)
Office Visit    Patient Name: Catherine [name] Date of Encounter: 05/22/2021  PCP:  [name], MD   [place]  Cardiologist:  [name], MD  Advanced Practice Provider:  No care team member to display Electrophysiologist:  None   Chief Complaint    Catherine [name] is a 86 y.o. 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  Past Medical History    Past Medical History:  Diagnosis Date   Acute on chronic diastolic heart failure (HCC)    Aortic stenosis, mild 07/26/2017   Arthritis    Asthma    last attack 02/2015   Atrial fibrillation, chronic (HCC)    Cellulitis of left lower extremity 08/17/2016   Ulcer associated with severe venous insufficiency   Chronic anticoagulation    Chronic diastolic CHF (congestive heart failure) ([place])    Chronic kidney disease    "RIGHT MANY KIDNEY INFECTIONS AND STONES"   COPD (chronic obstructive pulmonary disease) (HCC)    Coronary artery disease    Dizziness    H/O: rheumatic fever    Heart murmur    Hypertension    PONV (postoperative nausea and vomiting)    ' SOMETIMES', BUT NOT ALWAYS"   S/P Maze operation for atrial fibrillation 10/14/2016   Complete bilateral atrial lesion set using cryothermy and bipolar radiofrequency ablation - atrial appendage was not treated due to previous surgical procedure (open mitral commissurotomy)   S/P mitral valve replacement with bioprosthetic valve 10/14/2016   29 mm Medtronic Mosaic porcine bioprosthetic tissue valve   UTI (urinary tract infection)    Valvular heart disease    Has mitral stenosis with prior mitral commissurotomy in 1970   Past Surgical History:  Procedure Laterality Date   ABDOMINAL HYSTERECTOMY  1983    endometriosis   APPENDECTOMY     BACK SURGERY     neurosurgery x2   CARDIAC CATHETERIZATION     CARDIAC CATHETERIZATION N/A 08/03/2016   Procedure: Right/Left Heart Cath and Coronary Angiography;  Surgeon: Peter M Martinique, MD;  Location: [place] [facility];  Service: Cardiovascular;  Laterality: N/A;   cataract surg     CHOLECYSTECTOMY N/A 05/30/2015   Procedure: LAPAROSCOPIC CHOLECYSTECTOMY WITH INTRAOPERATIVE CHOLANGIOGRAM;  Surgeon: Excell Seltzer, MD;  Location: [place];  Service: General;  Laterality: N/A;   COLONOSCOPY     CORONARY ARTERY BYPASS GRAFT N/A 10/14/2016   Procedure: CORONARY ARTERY BYPASS GRAFTING (CABG);  Surgeon: Rexene Alberts, MD;  Location: [place];  Service: Open Heart Surgery;  Laterality: N/A;   EYE SURGERY     MAZE N/A 10/14/2016   Procedure: MAZE;  Surgeon: Rexene Alberts, MD;  Location: [place];  Service: Open Heart Surgery;  Laterality: N/A;   MITRAL VALVE REPLACEMENT N/A 10/14/2016   Procedure: REDO MITRAL VALVE REPLACEMENT (MVR);  Surgeon: Rexene Alberts, MD;  Location: [place];  Service: Open Heart Surgery;  Laterality: N/A;   MITRAL VALVE SURGERY Left 1970   Open mitral commissurotomy via left thoracotomy approach   TEE WITHOUT CARDIOVERSION N/A 07/05/2016   Procedure: TRANSESOPHAGEAL ECHOCARDIOGRAM (TEE);  Surgeon: [name], MD;  Location: [place];  Service: Cardiovascular;  Laterality: N/A;   TEE WITHOUT CARDIOVERSION N/A 10/14/2016   Procedure: TRANSESOPHAGEAL ECHOCARDIOGRAM (TEE);  Surgeon:  Rexene Alberts, MD;  Location: [place];  Service: Open Heart Surgery;  Laterality: N/A;   Allergies  Allergies  Allergen Reactions   Aldactone [Spironolactone] Other (See Comments)    dyspnea   Amoxicillin Palpitations    Tachycardia Has patient had a PCN reaction causing immediate rash, facial/tongue/throat swelling, SOB or lightheadedness with hypotension: no Has patient had a PCN reaction causing severe rash involving mucus membranes or skin necrosis:  {no Has patient had a PCN reaction that required hospitalization {no Has patient had a PCN reaction occurring within the last 10 years: {yes If all of the above answers are "NO", then may proceed with Cephalosporin use.   Diltiazem Other (See Comments)    Causing headaches    Flagyl [Metronidazole Hcl] Other (See Comments)    Causing headaches     Flovent [Fluticasone Propionate] Other (See Comments)    Leg cramps   Gabapentin Swelling   Lyrica [Pregabalin] Swelling   Quinidine Diarrhea and Other (See Comments)    Fever diarrhea   Simvastatin Other (See Comments)    Leg pain, myalgia   Tramadol Nausea Only   Verapamil Other (See Comments)    myalgias   Amlodipine     Low extremity edema   Crestor [Rosuvastatin]     myalgia   Livalo [Pitavastatin]     myalgias   Zetia [Ezetimibe]     LEG CRAMPS    Ace Inhibitors Other (See Comments)    unknown   Benazepril Hcl Cough   Ciprocin-Fluocin-Procin [Fluocinolone Acetonide] Other (See Comments)    unknown   Ciprofloxacin Diarrhea   Codeine Nausea Only   Nitrofurantoin Monohyd Macro Nausea Only    History of Present Illness    Catherine [name] is a 86 y.o. 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  History of rheumatic MV disease and underwent mitral commissurotomy in the 1970s. 09/2017 she underwent bioprosthetic MVR, CABGx1, and bilateral MAZE procedure with Dr. Roxy Manns.  Echo 09/2019 LVEF 65-70%, mild LVH, no RWMA, bioprosthetic mitral valve functioning appropriately, mild AS, mild pulmonic regurgitation.   She has been evaluated by atrial fibrillation clinic for question of atrial fibrillation vs long PR which remained unclear. However, it was decided that NSR would not be possible to maintain if she were in atrial  fib.   She has had persistent fatigue since her surgery. She was seen 11/2020 by Dr. Oval Linsey with improvement in abdominal bloating after transition from Lasix to Torsemide. Her persistent LE edema was thought to be due to venous insufficiency. Unable to tolerate compression socks and declined referral to [facility].  Last seen 04/30/21 with LE edema. Her Torsemide was increased to '60mg'$  TID x 3 days then returned to '40mg'$  BID. No abdominal bloating.   Presents today for follow up. Continues to go to [employer] on Mondays and Wednesdays. Her weight is down 5 pounds. Tells me edema quickly resolved on 3 days of increased torsemide. However, starting yesterday notes mildly worsening edema to right ankle. Continues to sit with legs up but does not tolerate compression stockings. She notes some pain in her left hip radiating down her left leg consistent with sciatica. Her husband is present in clinic today and tells me he has encouraged her to do stretching exercises. Reports shortness of breath is much improved. Reports  no chest pain, pressure, or tightness. No  orthopnea, PND. Reports no palpitations.  No lightheadedness, dizziness.   EKGs/Labs/Other Studies Reviewed:   The following studies were reviewed today:   Right/Left Cardiac Catheterization 08/03/2016: The left ventricular systolic function is normal. LV end diastolic pressure is normal. The left ventricular ejection fraction is 55-65% by visual estimate. There is no aortic valve stenosis. There is severe mitral valve stenosis. Prox Cx to Mid Cx lesion, 50 %stenosed. Mid RCA-2 lesion, 80 %stenosed. Mid RCA-1 lesion, 80 %stenosed. Hemodynamic findings consistent with mild pulmonary hypertension. LV end diastolic pressure is normal.   1. Coronary artery disease   - 50% mid LCx   - 80% sequential lesions in the mid RCA 2. Normal LV function 3. Severe mitral stenosis. MV gradient of 13 mm Hg. MVA 0.99 cm squared with index 0.57. 4. Moderate  to severe mitral insufficiency 5. Mild pulmonary HTN. 6. Normal LV EDP 7. No significant AV gradient 8. Occluded right brachial artery.   Plan: surgical evaluation for MVR and CABG. _______________   Echocardiogram 10/15/2019: Impressions: 1. Left ventricular ejection fraction, by visual estimation, is 65 to  70%. The left ventricle has hyperdynamic function. Left ventricular septal  wall thickness was mildly increased. There is mildly increased left  ventricular hypertrophy.   2. The left ventricle has no regional wall motion abnormalities.   3. Global right ventricle has normal systolic function.The right  ventricular size is normal. No increase in right ventricular wall  thickness.   4. Left atrial size was moderately dilated.   5. Right atrial size was moderately dilated.   6. The mitral valve has been repaired/replaced. No evidence of mitral  valve regurgitation. No evidence of mitral stenosis.   7. Patient has a 29 mm biprosthetic valve functioning normally with no  PVL The mitral struts protrude into the LVOT quite a bit likely  contributing to some of the gradient across the LVOT/AV. Creates a small  "neointimal LVOT" area.   8. The tricuspid valve is normal in structure.   9. The aortic valve is tricuspid. Aortic valve regurgitation is not  visualized. Mild aortic valve stenosis.  10. Pulmonic regurgitation is mild.  11. The pulmonic valve was grossly normal. Pulmonic valve regurgitation is  mild.  12. Mildly elevated pulmonary artery systolic pressure.  EKG:  No EKG is ordered today.  The ekg independently reviewed 04/30/21 showed atrial fibrillation 57 bpm with no acute ST/T wave changes.   Recent Labs: 07/09/2020: Pro B Natriuretic peptide (BNP) 253.0; TSH 3.95 11/27/2020: Hemoglobin 13.1; Magnesium 2.2; Platelets 173 03/19/2021: ALT 20 05/07/2021: BNP 143.1; BUN 13; Creatinine, Ser 0.86; Potassium 4.0; Sodium 147  Recent Lipid Panel    Component Value Date/Time    CHOL 153 03/19/2021 1023   TRIG 105 03/19/2021 1023   HDL 54 03/19/2021 1023   CHOLHDL 2.8 03/19/2021 1023   CHOLHDL 2.8 07/09/2020 1403   VLDL 42.8 (H) 09/24/2019 1054   LDLCALC 80 03/19/2021 1023   LDLCALC 77 07/09/2020 1403   LDLDIRECT 68.0 09/24/2019 1054    Risk Assessment/Calculations:   CHA2DS2-VASc Score = 6  This indicates a 9.7% annual risk of stroke. The patient's score is based upon: CHF History: Yes HTN History: Yes Diabetes History: No Stroke History: No Vascular Disease History: Yes Age Score: 2 Gender Score: 1   Home Medications   Current Meds  Medication Sig   acetaminophen (TYLENOL) 500 MG tablet Take 500 mg by mouth daily.   albuterol (VENTOLIN HFA)  108 (90 Base) MCG/ACT inhaler Inhale 2 puffs into the lungs every 4 (four) hours as needed for wheezing or shortness of breath.   cephALEXin (KEFLEX) 250 MG capsule TAKE ONE CAPSULE ONCE DAILY   cholecalciferol (VITAMIN D3) 25 MCG (1000 UNIT) tablet Take 1 tablet (1,000 Units total) by mouth daily.   diazepam (VALIUM) 5 MG tablet TAKE ONE TABLET AT BEDTIME   doxazosin (CARDURA) 8 MG tablet Take 1 tablet (8 mg total) by mouth daily.   metoprolol succinate (TOPROL-XL) 25 MG 24 hr tablet TAKE 1 TABLET DAILY   Multiple Vitamins-Minerals (HAIR/SKIN/NAILS/BIOTIN PO) Take 1 tablet by mouth daily.   potassium chloride (KLOR-CON) 10 MEQ tablet TAKE 8 TABLETS DAILY   rosuvastatin (CRESTOR) 5 MG tablet Take one tablet every Monday, wednesday Friday and Sunday   SYMBICORT 80-4.5 MCG/ACT inhaler 2 PUFFS 2 TIMES A DAY   torsemide (DEMADEX) 20 MG tablet Take 2 tablets (40 mg total) by mouth 2 (two) times daily.   warfarin (COUMADIN) 2 MG tablet Take 1 tablet daily or take as directed by anticoagulation clinic     Review of Systems      All other systems reviewed and are otherwise negative except as noted above.  Physical Exam    VS:  BP (!) 152/82   Pulse 60   Wt 153 lb (69.4 kg)   SpO2 93%   BMI 27.10 kg/m  , BMI  Body mass index is 27.1 kg/m.  Wt Readings from Last 3 Encounters:  05/22/21 153 lb (69.4 kg)  04/30/21 157 lb (71.2 kg)  04/01/21 153 lb 6.4 oz (69.6 kg)    GEN: Well nourished, well developed, in no acute distress. HEENT: normal. Neck: Supple, no JVD, carotid bruits, or masses. Cardiac: IRIR, no  rubs, or gallops. Gr 2/6 systolic murmur. No clubbing, cyanosis. R ankle with non pitting edema..  Radials/PT 2+ and equal bilaterally.  Respiratory:  Respirations regular and unlabored, clear to auscultation bilaterally. GI: Soft, nontender, nondistended. MS: No deformity or atrophy. Skin: Warm and dry, no rash. LE with bilateral varicose veins.  Neuro:  Strength and sensation are intact. Psych: Normal affect.  Assessment & Plan    CAD s/p CAG - Stable with no anginal symptoms. No indication for ischemic evaluation.  GDMT includes statin, beta-blocker.  No aspirin due to chronic anticoagulation. Heart healthy diet and regular cardiovascular exercise encouraged.    Rheumatic mitral valve disease s/p bioprosthetic MVR -functioning appropriately on previous echocardiogram.  Continue optimal BP and volume control. Repeat echo ordered at previous visit for monitoring and will ensure she schedules prior to leaving office today.   Chronic anticoagulation / Chronic atrial fibrillation - Rate controlled. Continue warfarin due to CHA2DS2-VASc of at least 6 (agex2, gender, CHF, HTN, MI). Denies bleeding complications.   Chronic diastolic heart failure - Echo 09/2019 LVEF 65-70%, mild LVH.  Weight down 5 lbs. Slight ankle edema recurred starting yesterday. Take Torsemide '60mg'$  tonigt and in the AM. Then return to '40mg'$  BID. We discussed that she could increase to '60mg'$  once per week as needed for edema. Recent BMP with stable renal function and electrolytes. <2L fluid restriction, low salt diet, and elevation of legs encouraged.     Aortic stenosis - Mild AS by echo 09/2019.  Update echocardiogram ordered at  last visit, encouraged to schedule.  Carotid artery stenosis - bilateral 1-39% stenosis. Continue optimal cholesterol control. No aspirin due to chronic anticoagulation. Repeat imaging as clinically indicated.   Dizziness/Lightheadedness -update echocardiogram, as above.  encouraged to make position changes slowly and stay well-hydrated.  HTN -BP mildly elevated today. She is hesitant regarding medication changes. Reported mild lightheadedness at last clinic visit which has since resolved. She is agreeable to monitor at home, provided BP log, and she will report home readings consistently >130/80.  If additional therapy needed very limited by multiple allergies and intolerances.  HLD - Following with lipid clinic. Rosuvastatin increased to 4x/week end of June.  Tolerating without myalgias  Disposition: Follow up in October as scheduled with Dr. Oval Linsey  Signed, Loel Dubonnet, NP 05/22/2021, 10:17 AM [place]

## 2021-05-22 NOTE — Patient Instructions (Addendum)
Medication Instructions:  Your physician has recommended you make the following change in your medication:   Tonight and in the morning take Torsemide '60mg'$  (3 tablets) to help reduce the swelling in your ankle.  CHANGE Torsemide to take '40mg'$  twice daily. You may increase to '60mg'$  (3 tablets) twice daily as needed for weight gain or edema. Please contact our office if you are having to take extra Torsemide more than once per week.   If you ever need help organizing your medications check with your local pharmacy as they might be able to help with pill packs.   *If you need a refill on your cardiac medications before your next appointment, please call your pharmacy*  Lab Work: None ordered today.   Testing/Procedures: Recommend scheduling your echocardiogram  before your appointment with Dr. Oval Linsey in October.   Follow-Up: At [facility], you and your health needs are our priority.  As part of our continuing mission to provide you with exceptional heart care, we have created designated Provider Care Teams.  These Care Teams include your primary Cardiologist (physician) and Advanced Practice Providers (APPs -  Physician Assistants and Nurse Practitioners) who all work together to provide you with the care you need, when you need it.  We recommend signing up for the patient portal called "MyChart".  Sign up information is provided on this After Visit Summary.  MyChart is used to connect with patients for Virtual Visits (Telemedicine).  Patients are able to view lab/test results, encounter notes, upcoming appointments, etc.  Non-urgent messages can be sent to your provider as well.   To learn more about what you can do with MyChart, go to [email or web address].    Your next appointment:   As scheduled with Dr. Oval Linsey  Other Instructions  Try stretching exercises for sciatica   Recommend weighing daily and keeping a log. Please call our office if you have weight gain of 2 pounds  overnight or 5 pounds in 1 week.    Heart Healthy Diet Recommendations: A low-salt diet is recommended. Meats should be grilled, baked, or boiled. Avoid fried foods. Focus on lean protein sources like fish or chicken with vegetables and fruits. The American Heart Association is a Microbiologist!   Exercise recommendations: The American Heart Association recommends 150 minutes of moderate intensity exercise weekly. Try 30 minutes of moderate intensity exercise 4-5 times per week. This could include walking, jogging, or swimming.   Tips to Measure your Blood Pressure Correctly  Here's what you can do to ensure a correct reading:  Don't drink a caffeinated beverage or smoke during the 30 minutes before the test.  Sit quietly for five minutes before the test begins.  During the measurement, sit in a chair with your feet on the floor and your arm supported so your elbow is at about heart level.  The inflatable part of the cuff should completely cover at least 80% of your upper arm, and the cuff should be placed on bare skin, not over a shirt.  Don't talk during the measurement.  Have your blood pressure measured twice, with a brief break in between. If the readings are different by 5 points or more, have it done a third time.  In 2017, new guidelines from the [place], the [employer] of Cardiology, and nine other health organizations lowered the diagnosis of high blood pressure to 130/80 mm Hg or higher for all adults. The guidelines also redefined the various blood pressure categories to now include normal,  elevated, Stage 1 hypertension, Stage 2 hypertension, and hypertensive crisis (see "Blood pressure categories").  Blood pressure categories  Blood pressure category SYSTOLIC (upper number)  DIASTOLIC (lower number)  Normal Less than 120 mm Hg and Less than 80 mm Hg  Elevated 120-129 mm Hg and Less than 80 mm Hg  High blood pressure: Stage 1 hypertension 130-139 mm  Hg or 80-89 mm Hg  High blood pressure: Stage 2 hypertension 140 mm Hg or higher or 90 mm Hg or higher  Hypertensive crisis (consult your doctor immediately) Higher than 180 mm Hg and/or Higher than 120 mm Hg  Source: American Heart Association and American Stroke Association. For more on getting your blood pressure under control, buy Controlling Your Blood Pressure, a Special Health Report from [facility].   Blood Pressure Log   Date   Time  Blood Pressure  Position  Example: Nov 1 9 AM 124/78 sitting

## 2021-06-03 ENCOUNTER — Other Ambulatory Visit: Payer: Self-pay

## 2021-06-03 ENCOUNTER — Ambulatory Visit (INDEPENDENT_AMBULATORY_CARE_PROVIDER_SITE_OTHER): Payer: Medicare Other

## 2021-06-03 DIAGNOSIS — Z7901 Long term (current) use of anticoagulants: Secondary | ICD-10-CM

## 2021-06-03 LAB — POCT INR: INR: 2.8 (ref 2.0–3.0)

## 2021-06-03 NOTE — Patient Instructions (Addendum)
Pre visit review using our clinic review tool, if applicable. No additional management support is needed unless otherwise documented below in the visit note.  Continue to take 1 tablet daily except 1/2 tablet on Wednesday and Saturday.  Re-check in 4 weeks.

## 2021-06-16 ENCOUNTER — Telehealth: Payer: Self-pay | Admitting: Pharmacist

## 2021-06-16 NOTE — Chronic Care Management (AMB) (Signed)
    Chronic Care Management Pharmacy Assistant   Name: KALIANNA VERBEKE  MRN: [id number] DOB: 03-Jan-1935  06/17/21 APPOINTMENT REMINDER   [name] was reminded to have all medications, supplements and any blood glucose and blood pressure readings available for review with [name], Pharm. D, at her office visit on 06/17/21 at 10am.   Questions: Have you had any recent office visit or specialist visit outside of [place]? No.  Are there any concerns you would like to discuss during your office visit? Patient states her left hip has been bothering her and interfering with her exercise class.  Are you having any problems obtaining your medications? No issues at this time.  If patient has any PAP medications ask if they are having any problems getting their PAP medication or refill? No patient assistance currently.  Care Gaps:  AWV - completed on 04/07/21 COVID-19 vaccine booster 4 - overdue since 11/11/20 Flu vaccine - due  Star Rating Drug:  Rosuvastatin 5mg  - last filled on 05/19/21 58DS at [facility].  Any gaps in medications fill history? No.  [place]  Clinical Pharmacist Assistant [phone]

## 2021-06-17 ENCOUNTER — Ambulatory Visit (INDEPENDENT_AMBULATORY_CARE_PROVIDER_SITE_OTHER): Payer: Medicare Other | Admitting: Pharmacist

## 2021-06-17 ENCOUNTER — Other Ambulatory Visit: Payer: Self-pay

## 2021-06-17 DIAGNOSIS — I4891 Unspecified atrial fibrillation: Secondary | ICD-10-CM

## 2021-06-17 DIAGNOSIS — I10 Essential (primary) hypertension: Secondary | ICD-10-CM

## 2021-06-17 NOTE — Progress Notes (Signed)
Chronic Care Management Pharmacy Note  06/26/2021 Name:  Catherine [name] MRN:  [id number] DOB:  06-13-1935  Summary: BP not at goal < 140/90 per office readings  Recommendations/Changes made from today's visit: -Recommended trial of inhaler with a spacer and recommended to obtain prescription from pulmonary -Recommended home BP monitoring at least weekly  Plan: BP assessment in 1-2 months   Subjective: Catherine [name] is [name] 86 y.o. year old female who is a primary patient of Burchette, Alinda Sierras, MD.  The CCM team was consulted for assistance with disease management and care coordination needs.    Engaged with patient face to face for follow up visit in response to provider referral for pharmacy case management and/or care coordination services.   Consent to Services:  The patient was given information about Chronic Care Management services, agreed to services, and gave verbal consent prior to initiation of services.  Please see initial visit note for detailed documentation.   Patient Care Team: Catherine Post, MD as PCP - General (Family Medicine) Catherine Latch, MD as PCP - Cardiology (Cardiology) Catherine [name], [facility] as Pharmacist (Pharmacist)  Recent office visits: 06/03/21 Catherine An, RN: Patient presented for anti-coag visit. INR 2.8, goal 2-3. Continued warfarin 1 mg (2 mg x 0.5) every Wed, Sat; 2 mg (2 mg x 1) all other days. Follow up in 4 weeks.  03/10/21 Catherine Littler MD (PCP) - seen for cellulitis of right wrist. Patient started on Fluconazole 178m. Changed Cephalexin from 2544mdaily to 50013m times daily. Follow up if no improvement.   01/12/2021 Catherine [name] (PCP) - seen for cellulitis of left wrist, hypertension, A-FIB and COPD. Patient started on Fluconazole 150m8mce for one dose. Changed cephalexin 250 mg caps once capsule daily to Cephalexin 500mg24mlly four times daily for 10 days. Patient instructed to consider starting a probiotic and  to follow up for any fever, progressive redness or other concerns.  Recent consult visits: 05/22/21 Catherine [name](cardiology): Patient presented for CAD follow up. Increased torsemide to 40 mg BID.   04/30/21 Catherine MontanaCardiology) - seen for CAD and other issues. No medication changes. Follow up in 1 week for labs and 3-4 weeks with Catherine Montanand october with Dr. RandoOval Linsey7/6/22 Catherine [facility]ullyPulmonary [facility]) - seen for COPD GOLD II. Decreased Cephalexin from 500mg 11mmes a day to 250mg d70m. Follow up in 12 months or sooner if needed.   02/24/21 Catherine Sande RivesCardiology) - presented to clinic for follow up on CAD and other chronic conditions. Discontinued Cephalexin 250mg da42mand changed to 250mg twi36maily. Discontinued aspirin 81mg. Fol64mup in 6 months.   01/20/2021 Catherine [name] Leighdiology)  - added pitavastatin calcium 2mg orally52mily. Discontinued rosuvastatin calcium 5mg. Vitami3m and fasting lipids 8 weeks after starting livalo ordered. No follow up noted at this time.  Hospital visits: None in previous 6 months   Objective:  Lab Results  Component Value Date   CREATININE 0.86 05/07/2021   BUN 13 05/07/2021   GFR 67.66 10/01/2019   GFRNONAA 59 (L) 10/29/2020   GFRAA 67 10/29/2020   NA 147 (H) 05/07/2021   K 4.0 05/07/2021   CALCIUM 9.4 05/07/2021   CO2 29 05/07/2021   GLUCOSE 96 05/07/2021    Lab Results  Component Value Date/Time   HGBA1C 5.3 10/12/2016 01:59 PM   GFR 67.66 10/01/2019 03:03 PM  GFR 71.73 09/24/2019 10:54 AM    Last diabetic Eye exam: No results found for: HMDIABEYEEXA  Last diabetic Foot exam: No results found for: HMDIABFOOTEX   Lab Results  Component Value Date   CHOL 146 05/22/2021   HDL 56 05/22/2021   LDLCALC 70 05/22/2021   LDLDIRECT 68.0 09/24/2019   TRIG 109 05/22/2021   CHOLHDL 2.6 05/22/2021    Hepatic Function Latest Ref Rng & Units 05/22/2021 03/19/2021 07/09/2020   Total Protein 6.0 - 8.5 g/dL 6.9 6.7 7.1  Albumin 3.6 - 4.6 g/dL 4.9(H) 4.6 -  AST 0 - 40 IU/L _0 ALT 0 - 32 IU/L _1 Alk Phosphatase 44 - 121 IU/L 115 121 -  Total Bilirubin 0.0 - 1.2 mg/dL 0.5 0.7 1.0  Bilirubin, Direct 0.00 - 0.40 mg/dL 0.16 0.22 0.2    Lab Results  Component Value Date/Time   TSH 3.95 07/09/2020 11:35 AM   TSH 3.670 05/23/2018 09:39 AM   FREET4 0.81 03/03/2015 10:51 AM   FREET4 0.81 03/04/2014 11:01 AM    CBC Latest Ref Rng & Units 11/27/2020 07/29/2020 07/09/2020  WBC 3.4 - 10.8 x10E3/uL 5.7 5.4 7.0  Hemoglobin 11.1 - 15.9 g/dL 13.1 12.8 13.3  Hematocrit 34.0 - 46.6 % 39.9 39.7 39.7  Platelets 150 - 450 x10E3/uL 173 185 171    Lab Results  Component Value Date/Time   VD25OH 25.0 (L) 01/20/2021 12:09 PM   VD25OH 36 06/06/2012 11:09 AM    Clinical ASCVD: Yes  The ASCVD Risk score (Arnett DK, et al., 2019) failed to calculate for the following reasons:   The 2019 ASCVD risk score is only valid for ages 45 to 35    Depression screen PHQ 2/9 04/07/2021 09/09/2020 07/01/2020  Decreased Interest 0 0 0  Down, Depressed, Hopeless 0 1 0  PHQ - 2 Score 0 1 0  Altered sleeping - - 1  Tired, decreased energy - - 3  Change in appetite - - 0  Feeling bad or failure about yourself  - - 0  Trouble concentrating - - 0  Moving slowly or fidgety/restless - - 0  Suicidal thoughts - - 0  PHQ-9 Score - - 4  Difficult doing work/chores - - Somewhat difficult  Some recent data might be hidden    CHA2DS2/VAS Stroke Risk Points  Current as of 10 minutes ago     6 >= 2 Points: High Risk  1 - 1.99 Points: Medium Risk  0 Points: Low Risk    Last Change: N/A      Details    This score determines the patient's risk of having a stroke if the  patient has atrial fibrillation.       Points Metrics  1 Has Congestive Heart Failure:  Yes    Current as of 10 minutes ago  1 Has Vascular Disease:  Yes    Current as of 10 minutes ago  1 Has Hypertension:  Yes     Current as of 10 minutes ago  2 Age:  63    Current as of 10 minutes ago  0 Has Diabetes:  No    Current as of 10 minutes ago  0 Had Stroke:  No  Had TIA:  No  Had Thromboembolism:  No    Current as of 10 minutes ago  1 Female:  Yes    Current as of 10 minutes ago         Social History  Tobacco Use  Smoking Status Former   Packs/day: 1.00   Years: 15.00   Pack years: 15.00   Types: Cigarettes   Quit date: 09/28/1975   Years since quitting: 45.7  Smokeless Tobacco Never   BP Readings from Last 3 Encounters:  05/22/21 (!) 152/82  04/30/21 (!) 142/62  04/01/21 110/70   Pulse Readings from Last 3 Encounters:  05/22/21 60  04/30/21 (!) 57  04/01/21 (!) 56   Wt Readings from Last 3 Encounters:  05/22/21 153 lb (69.4 kg)  04/30/21 157 lb (71.2 kg)  04/01/21 153 lb 6.4 oz (69.6 kg)   BMI Readings from Last 3 Encounters:  05/22/21 27.10 kg/m  04/30/21 27.81 kg/m  04/01/21 27.17 kg/m    Assessment/Interventions: Review of patient past medical history, allergies, medications, health status, including review of consultants reports, laboratory and other test data, was performed as part of comprehensive evaluation and provision of chronic care management services.   SDOH:  (Social Determinants of Health) assessments and interventions performed: No  SDOH Screenings   Alcohol Screen: Low Risk    Last Alcohol Screening Score (AUDIT): 0  Depression (PHQ2-9): Low Risk    PHQ-2 Score: 0  Financial Resource Strain: Not on file  Food Insecurity: No Food Insecurity   Worried About Charity fundraiser in the Last Year: Never true   Ran Out of Food in the Last Year: Never true  Housing: Low Risk    Last Housing Risk Score: 0  Physical Activity: Insufficiently Active   Days of Exercise per Week: 2 days   Minutes of Exercise per Session: 60 min  Social Connections: Moderately Integrated   Frequency of Communication with Friends and Family: Twice a week   Frequency of  Social Gatherings with Friends and Family: Twice a week   Attends Religious Services: More than 4 times per year   Active Member of [employer] or Organizations: No   Attends Music therapist: Never   Marital Status: Married  Stress: Not on file  Tobacco Use: Medium Risk   Smoking Tobacco Use: Former   Smokeless Tobacco Use: Never  Transportation Needs: Not on file    CCM Care Plan  Allergies  Allergen Reactions   Aldactone [Spironolactone] Other (See Comments)    dyspnea   Amoxicillin Palpitations    Tachycardia Has patient had a PCN reaction causing immediate rash, facial/tongue/throat swelling, SOB or lightheadedness with hypotension: no Has patient had a PCN reaction causing severe rash involving mucus membranes or skin necrosis: {no Has patient had a PCN reaction that required hospitalization {no Has patient had a PCN reaction occurring within the last 10 years: {yes If all of the above answers are "NO", then may proceed with Cephalosporin use.   Diltiazem Other (See Comments)    Causing headaches    Flagyl [Metronidazole Hcl] Other (See Comments)    Causing headaches     Flovent [Fluticasone Propionate] Other (See Comments)    Leg cramps   Gabapentin Swelling   Lyrica [Pregabalin] Swelling   Quinidine Diarrhea and Other (See Comments)    Fever diarrhea   Simvastatin Other (See Comments)    Leg pain, myalgia   Tramadol Nausea Only   Verapamil Other (See Comments)    myalgias   Amlodipine     Low extremity edema   Crestor [Rosuvastatin]     myalgia   Livalo [Pitavastatin]     myalgias   Zetia [Ezetimibe]     LEG CRAMPS  Ace Inhibitors Other (See Comments)    unknown   Benazepril Hcl Cough   Ciprocin-Fluocin-Procin [Fluocinolone Acetonide] Other (See Comments)    unknown   Ciprofloxacin Diarrhea   Codeine Nausea Only   Nitrofurantoin Monohyd Macro Nausea Only    Medications Reviewed Today     Reviewed by Loel Dubonnet, NP (Nurse  Practitioner) on 05/22/21 at 2209  Med List Status: <None>   Medication Order Taking? Sig Documenting Provider Last Dose Status Informant  acetaminophen (TYLENOL) 500 MG tablet [id number] Yes Take 500 mg by mouth daily. [provider] Taking Active Spouse/Significant Other  albuterol (VENTOLIN HFA) 108 (90 Base) MCG/ACT inhaler [id number] Yes Inhale 2 puffs into the lungs every 4 (four) hours as needed for wheezing or shortness of breath. Tanda Rockers, MD Taking Active   cephALEXin [facility]) 250 MG capsule [id number] Yes TAKE ONE CAPSULE ONCE DAILY Burchette, Alinda Sierras, MD Taking Active   cholecalciferol (VITAMIN D3) 25 MCG (1000 UNIT) tablet [id number] Yes Take 1 tablet (1,000 Units total) by mouth daily. Catherine Latch, MD Taking Active   diazepam (VALIUM) 5 MG tablet [id number] Yes TAKE ONE TABLET AT BEDTIME Burchette, Alinda Sierras, MD Taking Active   doxazosin (CARDURA) 8 MG tablet [id number] Yes Take 1 tablet (8 mg total) by mouth daily. Catherine Latch, MD Taking Active   metoprolol succinate (TOPROL-XL) 25 MG 24 hr tablet [id number] Yes TAKE 1 TABLET DAILY Burchette, Alinda Sierras, MD Taking Active   Multiple Vitamins-Minerals (HAIR/SKIN/NAILS/BIOTIN PO) [id number] Yes Take 1 tablet by mouth daily. [provider] Taking Active Spouse/Significant Other  potassium chloride (KLOR-CON) 10 MEQ tablet [id number] Yes TAKE 8 TABLETS DAILY Catherine Latch, MD Taking Active   rosuvastatin (CRESTOR) 5 MG tablet [id number] Yes Take one tablet every Monday, wednesday Friday and Sunday Catherine Latch, MD Taking Active   SYMBICORT 80-4.5 MCG/ACT inhaler [id number] Yes 2 PUFFS 2 TIMES A Luiz Blare, MD Taking Active   torsemide (DEMADEX) 20 MG tablet [id number]  Take 2 tablets (82m total) twice daily. You may take [name] extra tablet for a total dose of 3 tablets (662mtotal) twice daily as needed for weight gain or swelling. WaLoel DubonnetNP  Active   warfarin (COUMADIN) 2 MG tablet [id number] Take 1 tablet daily or take as directed by anticoagulation clinic BuEulas PostMD Taking Active             Patient Active Problem List   Diagnosis Date Noted   Protein-calorie malnutrition, severe 09/05/2019   Dehydration    Gastroenteritis    Enteritis 09/03/2019   COVID-19 08/27/2019   Dysuria 07/07/2019   Chronic anxiety 12/23/2017   Aortic stenosis, mild 07/26/2017   Dyslipidemia 12/20/2016   S/P mitral valve replacement with bioprosthetic valve + CABG x1 + maze procedure 10/14/2016   S/P Maze operation for atrial fibrillation 10/14/2016   S/P CABG x 1 10/14/2016   Cellulitis of left lower extremity 08/17/2016   Acute on chronic diastolic heart failure (HCC)    Coronary artery disease    Chronic insomnia 07/12/2016   DOE (dyspnea on exertion) 03/26/2015   Warfarin-induced coagulopathy (HC[place]06/19/2016   Valvular heart disease 03/16/2015   COPD GOLD II  03/16/2015   Nephrolithiasis 03/16/2015   Essential hypertension    RHEUMATIC MITRAL STENOSIS 01/05/2008   Rheumatic heart disease 01/05/2008   Atrial fibrillation (HCJ[place]ann[place]04/06/2008    Immunization History  Administered Date(s) Administered   Fluad Quad(high Dose 65+) 07/01/2020   Influenza Split  08/22/2011, 08/06/2012, 07/26/2013   Influenza, High Dose Seasonal PF 09/17/2015, 07/12/2016, 08/01/2017   Influenza,inj,Quad PF,6+ Mos 06/21/2019   Influenza,inj,quad, With Preservative 08/28/2018, 06/21/2019   Influenza-Unspecified 09/18/2018   Moderna Sars-Covid-2 Vaccination 11/06/2019, 12/04/2019, 08/19/2020   Pneumococcal Conjugate-13 09/17/2015   Pneumococcal Polysaccharide-23 11/22/2011   Tdap 05/04/2012   Zoster Recombinat (Shingrix) 10/13/2017, 12/13/2017   Zoster, Live 08/22/2011    Conditions to be addressed/monitored:  Hypertension, Hyperlipidemia, Atrial Fibrillation, Heart Failure, Coronary Artery Disease, COPD, and Anxiety  Conditions addressed this visit: COPD, hypertension   Care Plan  : [place]  Updates made by Catherine [name], Martin since 06/26/2021 12:00 AM     Problem: Problem: Hypertension, Hyperlipidemia, Atrial Fibrillation, Heart Failure, Coronary Artery Disease, COPD, and Anxiety      Long-Range Goal: Patient-Specific Goal   Start Date: 06/17/2021  Expected End Date: 06/17/2022  This Visit's Progress: On track  Priority: High  Note:   Current Barriers:  Unable to independently monitor therapeutic efficacy  Pharmacist Clinical Goal(s):  Patient will achieve adherence to monitoring guidelines and medication adherence to achieve therapeutic efficacy through collaboration with PharmD and provider.   Interventions: 1:1 collaboration with Catherine Post, MD regarding development and update of comprehensive plan of care as evidenced by provider attestation and co-signature Inter-disciplinary care team collaboration (see longitudinal plan of care) Comprehensive medication review performed; medication list updated in electronic medical record  Hypertension (BP goal <140/90) -Uncontrolled -Current treatment: Doxazosin 8 mg daily  - in PM  Torsemide 40 mg twice daily - in AM Metoprolol XL 25 mg daily - in PM -Medications previously tried: Amlodipine, atenolol, irbesartan, losartan, maxzide, nebivolol, spironolactone  -Current home readings: 118/68 -Current dietary habits: tries to limit salt intake -Current exercise habits: limited in her ability -Denies hypotensive/hypertensive symptoms -Educated on BP goals and benefits of medications for prevention of heart attack, stroke and kidney damage; Importance of home blood pressure monitoring; Proper BP monitoring technique; Symptoms of hypotension and importance of maintaining adequate hydration; -Counseled to monitor BP at home at least weekly, document, and provide log at future appointments -Counseled on diet and exercise extensively Recommended to continue current medication  Hyperlipidemia:  (LDL goal < 70) -Not ideally controlled -Current treatment: Rosuvastatin 5 mg 1 tablet 4 days a week -Medications previously tried: Livalo (cost),  Zetia (myalgias), simvastatin (myalgias) -Current dietary patterns: did not discuss -Current exercise habits: minimal -Educated on Cholesterol goals;  Importance of limiting foods high in cholesterol; Exercise goal of 150 minutes per week; -Counseled on diet and exercise extensively Recommended to continue current medication  Atrial Fibrillation (Goal: prevent stroke and major bleeding) -Controlled -CHADSVASC: 6 -Current treatment: Rate control: Metoprolol XL 25 mg daily Anticoagulation: warfarin 2 mg tablets as directed by coumadin clinic -Medications previously tried: Amiodarone -Home BP and HR readings: refer to above  -Counseled on increased risk of stroke due to Afib and benefits of anticoagulation for stroke prevention; bleeding risk associated with warfarin and importance of self-monitoring for signs/symptoms of bleeding; avoidance of NSAIDs due to increased bleeding risk with anticoagulants; -Recommended to continue current medication  Heart Failure (Goal: manage symptoms and prevent exacerbations) -Controlled -Last ejection fraction: 60-65% (Date: 10/15/19) -HF type: Diastolic -NYHA Class: II (slight limitation of activity) -AHA HF Stage: B (Heart disease present - no symptoms present) -Current treatment: Torsemide 40 mg  twice daily Metoprolol XL 25 mg daily  Potassium chloride 10 mEq 8 tablets daily -Medications previously tried: none  -Current home BP/HR readings: refer to  above -Current dietary habits: limits salt intake -Current exercise habits: minimal -Educated on Importance of weighing daily; if you gain more than 3 pounds in one day or 5 pounds in one week, call cardiologist. Proper diuretic administration and potassium supplementation Importance of blood pressure control -Counseled on diet and exercise  extensively Recommended to continue current medication  COPD (Goal: control symptoms and prevent exacerbations) -Controlled -Current treatment  Albuterol HFA 108 mcg/act  Symbicort 80-4.5 mcg/act 2 puff twice daily  -Medications previously tried: none  -Gold Grade: Gold 2 (FEV1 50-79%) -Current COPD Classification:  A (low sx, <2 exacerbations/yr) -MMRC/CAT score: n/a -Pulmonary function testing: 2019 -Exacerbations requiring treatment in last 6 months: none -Patient denies consistent use of maintenance inhaler -Frequency of rescue inhaler use: sometimes once a week -Counseled on Proper inhaler technique; Benefits of consistent maintenance inhaler use When to use rescue inhaler Differences between maintenance and rescue inhalers -Recommended to continue current medication Counseled on benefits of having a spacer and how to obtain one.  Anxiety (Goal: minimize symptoms) -Not ideally controlled -Current treatment: Diazepam 5 mg at bedtime -Medications previously tried/failed: n/a -PHQ9: 0 -GAD7: n/a -Educated on Benefits of medication for symptom control Benefits of cognitive-behavioral therapy with or without medication -Recommended to continue current medication   Health Maintenance -Vaccine gaps: COVID booster, influenza -Current therapy:  Cephalexin 250 mg daily  Fluconazole 150 mg  Multivitamin daily (Vitafusion Multivite Gummies)  Ondansetron ODT 4 mg q8hr PRN  Magni-Life Topical Cream  Potassium 10 mEq 6 tablets daily (8 tablets twice daily)  -Educated on Cost vs benefit of each product must be carefully weighed by individual consumer -Patient is satisfied with current therapy and denies issues -Recommended to continue current medication  Patient Goals/Self-Care Activities Patient will:  - take medications as prescribed check blood pressure weekly, document, and provide at future appointments target a minimum of 150 minutes of moderate intensity exercise  weekly  Follow Up Plan: Telephone follow up appointment with care management team member scheduled for: 6 months       Medication Assistance: None required.  Patient affirms current coverage meets needs.  Compliance/Adherence/Medication fill history: Care Gaps: Influenza, COVID booster  Star-Rating Drugs: Rosuvastatin 17m - [facility]:  [place] [address] [place]Phone: [phone]: [phone] Uses pill box? Yes Pt endorses 99% compliance  We discussed: Current pharmacy is preferred with insurance plan and patient is satisfied with pharmacy services Patient decided to: Continue current medication management strategy  Care Plan and Follow Up Patient Decision:  Patient agrees to Care Plan and Follow-up.  Plan: Telephone follow up appointment with care management team member scheduled for:  6 months  MJeni Salles PharmD, BRockwallPharmacist LClearbrookat B[place]ry [phone]

## 2021-06-26 DIAGNOSIS — I4891 Unspecified atrial fibrillation: Secondary | ICD-10-CM | POA: Diagnosis not present

## 2021-06-26 DIAGNOSIS — I10 Essential (primary) hypertension: Secondary | ICD-10-CM | POA: Diagnosis not present

## 2021-06-29 ENCOUNTER — Ambulatory Visit (INDEPENDENT_AMBULATORY_CARE_PROVIDER_SITE_OTHER): Payer: Medicare Other

## 2021-06-29 ENCOUNTER — Other Ambulatory Visit: Payer: Self-pay

## 2021-06-29 DIAGNOSIS — Z7901 Long term (current) use of anticoagulants: Secondary | ICD-10-CM

## 2021-06-29 LAB — POCT INR: INR: 2.9 (ref 2.0–3.0)

## 2021-06-29 NOTE — Patient Instructions (Addendum)
Pre visit review using our clinic review tool, if applicable. No additional management support is needed unless otherwise documented below in the visit note.  Continue to take 1 tablet daily except 1/2 tablet on Wednesday and Saturday.  Re-check in 6 weeks.

## 2021-07-07 ENCOUNTER — Other Ambulatory Visit: Payer: Self-pay

## 2021-07-07 ENCOUNTER — Ambulatory Visit (HOSPITAL_COMMUNITY): Payer: Medicare Other | Attending: Cardiovascular Disease

## 2021-07-07 ENCOUNTER — Other Ambulatory Visit: Payer: Self-pay | Admitting: Family Medicine

## 2021-07-07 DIAGNOSIS — Z953 Presence of xenogenic heart valve: Secondary | ICD-10-CM | POA: Diagnosis not present

## 2021-07-07 DIAGNOSIS — Z951 Presence of aortocoronary bypass graft: Secondary | ICD-10-CM | POA: Insufficient documentation

## 2021-07-07 DIAGNOSIS — R600 Localized edema: Secondary | ICD-10-CM | POA: Insufficient documentation

## 2021-07-07 DIAGNOSIS — I25118 Atherosclerotic heart disease of native coronary artery with other forms of angina pectoris: Secondary | ICD-10-CM | POA: Diagnosis not present

## 2021-07-07 LAB — ECHOCARDIOGRAM COMPLETE
AR max vel: 2.55 cm2
AV Area VTI: 2.43 cm2
AV Area mean vel: 2.44 cm2
AV Mean grad: 9.2 mmHg
AV Peak grad: 16.5 mmHg
Ao pk vel: 2.03 m/s
Area-P 1/2: 1.39 cm2
MV VTI: 2.08 cm2
S' Lateral: 2.8 cm

## 2021-07-08 ENCOUNTER — Ambulatory Visit: Payer: Medicare Other

## 2021-07-13 ENCOUNTER — Other Ambulatory Visit: Payer: Self-pay | Admitting: Family Medicine

## 2021-07-13 ENCOUNTER — Other Ambulatory Visit (HOSPITAL_BASED_OUTPATIENT_CLINIC_OR_DEPARTMENT_OTHER): Payer: Self-pay | Admitting: Cardiovascular Disease

## 2021-07-14 ENCOUNTER — Telehealth: Payer: Self-pay | Admitting: Pharmacist

## 2021-07-14 ENCOUNTER — Ambulatory Visit (HOSPITAL_BASED_OUTPATIENT_CLINIC_OR_DEPARTMENT_OTHER): Payer: Medicare Other | Admitting: Cardiovascular Disease

## 2021-07-14 ENCOUNTER — Other Ambulatory Visit: Payer: Self-pay

## 2021-07-14 ENCOUNTER — Encounter (HOSPITAL_BASED_OUTPATIENT_CLINIC_OR_DEPARTMENT_OTHER): Payer: Self-pay | Admitting: Cardiovascular Disease

## 2021-07-14 ENCOUNTER — Ambulatory Visit (HOSPITAL_BASED_OUTPATIENT_CLINIC_OR_DEPARTMENT_OTHER)
Admission: RE | Admit: 2021-07-14 | Discharge: 2021-07-14 | Disposition: A | Discharge status: Home / Self Care | Payer: Medicare Other | Source: Ambulatory Visit | Attending: Family Medicine | Admitting: Family Medicine

## 2021-07-14 ENCOUNTER — Ambulatory Visit (INDEPENDENT_AMBULATORY_CARE_PROVIDER_SITE_OTHER): Payer: Medicare Other | Admitting: Family Medicine

## 2021-07-14 VITALS — BP 134/60 | HR 107 | Temp 98.3°F | Wt 157.0 lb

## 2021-07-14 VITALS — BP 154/80 | HR 58 | Ht 63.0 in | Wt 152.4 lb

## 2021-07-14 DIAGNOSIS — Z951 Presence of aortocoronary bypass graft: Secondary | ICD-10-CM | POA: Diagnosis not present

## 2021-07-14 DIAGNOSIS — M5416 Radiculopathy, lumbar region: Secondary | ICD-10-CM

## 2021-07-14 DIAGNOSIS — I10 Essential (primary) hypertension: Secondary | ICD-10-CM | POA: Diagnosis not present

## 2021-07-14 DIAGNOSIS — I4891 Unspecified atrial fibrillation: Secondary | ICD-10-CM | POA: Diagnosis not present

## 2021-07-14 DIAGNOSIS — I071 Rheumatic tricuspid insufficiency: Secondary | ICD-10-CM

## 2021-07-14 DIAGNOSIS — Z953 Presence of xenogenic heart valve: Secondary | ICD-10-CM | POA: Diagnosis not present

## 2021-07-14 DIAGNOSIS — E785 Hyperlipidemia, unspecified: Secondary | ICD-10-CM

## 2021-07-14 DIAGNOSIS — R21 Rash and other nonspecific skin eruption: Secondary | ICD-10-CM

## 2021-07-14 DIAGNOSIS — I361 Nonrheumatic tricuspid (valve) insufficiency: Secondary | ICD-10-CM | POA: Diagnosis not present

## 2021-07-14 DIAGNOSIS — M545 Low back pain, unspecified: Secondary | ICD-10-CM | POA: Diagnosis not present

## 2021-07-14 HISTORY — DX: Rheumatic tricuspid insufficiency: I07.1

## 2021-07-14 MED ORDER — TRIAMCINOLONE ACETONIDE 0.1 % EX CREA: 1.0000 "application " | TOPICAL_CREAM | Freq: Two times a day (BID) | CUTANEOUS | 0 refills | Status: DC

## 2021-07-14 NOTE — Progress Notes (Signed)
Established Patient Office Visit  Subjective:  Patient ID: Catherine [name], female    DOB: 09-21-1935  Age: 86 y.o. MRN: [id number]  CC:  Chief Complaint  Patient presents with   Herpes Zoster   Leg Pain    Left leg pain starts in the socket and goes down the leg. 2 -3 months ago and has gotten worse, sometimes goes numb. Has taken tylenol, sometimes 2 day.    HPI Catherine [name] presents for the following concerns  She is concerned about possible "shingles "right lower abdomen.  First noticed few bumps here a few days ago.  More pruritic than painful but she did have some mild burning pain initially.  No progressive rash.  She has had both Zostavax and Shingrix.  She has had some recent pain left buttock region radiating occasionally all the way down to the foot.  She has had this for about 2 to 3 months.  Has had prior low back surgery many years ago.  She takes Tylenol which helps somewhat.  She also complains of some numbness involving her left lower extremity intermittently.  No progressive weakness.  No urine or stool incontinence.  No recent fall or injury.  Past Medical History:  Diagnosis Date   Acute on chronic diastolic heart failure (HCC)    Aortic stenosis, mild 07/26/2017   Arthritis    Asthma    last attack 02/2015   Atrial fibrillation, chronic (HCC)    Cellulitis of left lower extremity 08/17/2016   Ulcer associated with severe venous insufficiency   Chronic anticoagulation    Chronic diastolic CHF (congestive heart failure) ([place])    Chronic kidney disease    "RIGHT MANY KIDNEY INFECTIONS AND STONES"   COPD (chronic obstructive pulmonary disease) (HCC)    Coronary artery disease    Dizziness    H/O: rheumatic fever    Heart murmur    Hypertension    PONV (postoperative nausea and vomiting)    ' SOMETIMES', BUT NOT ALWAYS"   S/P Maze operation for atrial fibrillation 10/14/2016   Complete bilateral atrial lesion set using cryothermy and bipolar radiofrequency  ablation - atrial appendage was not treated due to previous surgical procedure (open mitral commissurotomy)   S/P mitral valve replacement with bioprosthetic valve 10/14/2016   29 mm Medtronic Mosaic porcine bioprosthetic tissue valve   UTI (urinary tract infection)    Valvular heart disease    Has mitral stenosis with prior mitral commissurotomy in 1970    Past Surgical History:  Procedure Laterality Date   ABDOMINAL HYSTERECTOMY  1983   endometriosis   APPENDECTOMY     BACK SURGERY     neurosurgery x2   CARDIAC CATHETERIZATION     CARDIAC CATHETERIZATION N/A 08/03/2016   Procedure: Right/Left Heart Cath and Coronary Angiography;  Surgeon: Peter M Martinique, MD;  Location: [facility];  Service: Cardiovascular;  Laterality: N/A;   cataract surg     CHOLECYSTECTOMY N/A 05/30/2015   Procedure: LAPAROSCOPIC CHOLECYSTECTOMY WITH INTRAOPERATIVE CHOLANGIOGRAM;  Surgeon: Excell Seltzer, MD;  Location: [facility];  Service: General;  Laterality: N/A;   COLONOSCOPY     CORONARY ARTERY BYPASS GRAFT N/A 10/14/2016   Procedure: CORONARY ARTERY BYPASS GRAFTING (CABG);  Surgeon: Rexene Alberts, MD;  Location: [facility];  Service: Open Heart Surgery;  Laterality: N/A;   EYE SURGERY     MAZE N/A 10/14/2016   Procedure: MAZE;  Surgeon: Rexene Alberts, MD;  Location: [facility];  Service: Open  Heart Surgery;  Laterality: N/A;   MITRAL VALVE REPLACEMENT N/A 10/14/2016   Procedure: REDO MITRAL VALVE REPLACEMENT (MVR);  Surgeon: Rexene Alberts, MD;  Location: [facility];  Service: Open Heart Surgery;  Laterality: N/A;   MITRAL VALVE SURGERY Left 1970   Open mitral commissurotomy via left thoracotomy approach   TEE WITHOUT CARDIOVERSION N/A 07/05/2016   Procedure: TRANSESOPHAGEAL ECHOCARDIOGRAM (TEE);  Surgeon: [name], MD;  Location: [facility];  Service: Cardiovascular;  Laterality: N/A;   TEE WITHOUT CARDIOVERSION N/A 10/14/2016   Procedure: TRANSESOPHAGEAL ECHOCARDIOGRAM (TEE);  Surgeon: Rexene Alberts,  MD;  Location: [facility];  Service: Open Heart Surgery;  Laterality: N/A;    Family History  Problem Relation Age of Onset   Leukemia Father    Breast cancer Neg Hx     Social History   Socioeconomic History   Marital status: Married    Spouse name: Not on file   Number of children: Not on file   Years of education: Not on file   Highest education level: Not on file  Occupational History   Not on file  Tobacco Use   Smoking status: Former    Packs/day: 1.00    Years: 15.00    Pack years: 15.00    Types: Cigarettes    Quit date: 09/28/1975    Years since quitting: 45.8   Smokeless tobacco: Never  Vaping Use   Vaping Use: Never used  Substance and Sexual Activity   Alcohol use: No    Alcohol/week: 0.0 standard drinks   Drug use: No   Sexual activity: Yes  Other Topics Concern   Not on file  Social History Narrative   Not on file   Social Determinants of Health   Financial Resource Strain: Not on file  Food Insecurity: No Food Insecurity   Worried About Charity fundraiser in the Last Year: Never true   Ran Out of Food in the Last Year: Never true  Transportation Needs: Not on file  Physical Activity: Insufficiently Active   Days of Exercise per Week: 2 days   Minutes of Exercise per Session: 60 min  Stress: Not on file  Social Connections: Moderately Integrated   Frequency of Communication with Friends and Family: Twice a week   Frequency of Social Gatherings with Friends and Family: Twice a week   Attends Religious Services: More than 4 times per year   Active Member of [employer] or Organizations: No   Attends Archivist Meetings: Never   Marital Status: Married  Human resources officer Violence: Not At Risk   Fear of Current or Ex-Partner: No   Emotionally Abused: No   Physically Abused: No   Sexually Abused: No    Outpatient Medications Prior to Visit  Medication Sig Dispense Refill   acetaminophen (TYLENOL) 500 MG tablet Take 500 mg by mouth daily.      albuterol (VENTOLIN HFA) 108 (90 Base) MCG/ACT inhaler Inhale 2 puffs into the lungs every 4 (four) hours as needed for wheezing or shortness of breath. 1 each 2   cephALEXin (KEFLEX) 250 MG capsule TAKE ONE CAPSULE ONCE DAILY 30 capsule 3   cholecalciferol (VITAMIN D3) 25 MCG (1000 UNIT) tablet Take 1 tablet (1,000 Units total) by mouth daily.     diazepam (VALIUM) 5 MG tablet TAKE ONE TABLET AT BEDTIME 30 tablet 5   doxazosin (CARDURA) 8 MG tablet Take 1 tablet (8 mg total) by mouth daily. 90 tablet 3   metoprolol succinate (TOPROL-XL)  25 MG 24 hr tablet TAKE 1 TABLET DAILY 90 tablet 1   Multiple Vitamins-Minerals (HAIR/SKIN/NAILS/BIOTIN PO) Take 1 tablet by mouth daily.     Multiple Vitamins-Minerals (MULTIVITAMIN ADULT EXTRA C) CHEW Chew 2 tablets by mouth daily.     potassium chloride (KLOR-CON) 10 MEQ tablet TAKE UP TO 8 TABLETS DAILY AS DIRECTED 720 tablet 3   rosuvastatin (CRESTOR) 5 MG tablet Take one tablet every Monday, wednesday Friday and Sunday 30 tablet 11   SYMBICORT 80-4.5 MCG/ACT inhaler 2 PUFFS 2 TIMES A DAY 10.2 g 3   torsemide (DEMADEX) 20 MG tablet Take 2 tablets (70m total) twice daily. You may take an extra tablet for a total dose of 3 tablets (672mtotal) twice daily as needed for weight gain or swelling. 390 tablet 1   warfarin (COUMADIN) 2 MG tablet Take 1 tablet daily or take as directed by anticoagulation clinic 100 tablet 1   No facility-administered medications prior to visit.    Allergies  Allergen Reactions   Aldactone [Spironolactone] Other (See Comments)    dyspnea   Amoxicillin Palpitations    Tachycardia Has patient had a PCN reaction causing immediate rash, facial/tongue/throat swelling, SOB or lightheadedness with hypotension: no Has patient had a PCN reaction causing severe rash involving mucus membranes or skin necrosis: {no Has patient had a PCN reaction that required hospitalization {no Has patient had a PCN reaction occurring within the last 10  years: {yes If all of the above answers are "NO", then may proceed with Cephalosporin use.   Diltiazem Other (See Comments)    Causing headaches    Flagyl [Metronidazole Hcl] Other (See Comments)    Causing headaches     Flovent [Fluticasone Propionate] Other (See Comments)    Leg cramps   Gabapentin Swelling   Lyrica [Pregabalin] Swelling   Quinidine Diarrhea and Other (See Comments)    Fever diarrhea   Simvastatin Other (See Comments)    Leg pain, myalgia   Tramadol Nausea Only   Verapamil Other (See Comments)    myalgias   Amlodipine     Low extremity edema   Crestor [Rosuvastatin]     myalgia   Livalo [Pitavastatin]     myalgias   Zetia [Ezetimibe]     LEG CRAMPS    Ace Inhibitors Other (See Comments)    unknown   Benazepril Hcl Cough   Ciprocin-Fluocin-Procin [Fluocinolone Acetonide] Other (See Comments)    unknown   Ciprofloxacin Diarrhea   Codeine Nausea Only   Nitrofurantoin Monohyd Macro Nausea Only    ROS Review of Systems  Constitutional:  Negative for chills and fever.  Gastrointestinal:  Negative for abdominal pain.  Musculoskeletal:  Positive for back pain.  Skin:  Positive for rash.  Neurological:  Positive for numbness. Negative for weakness.     Objective:    Physical Exam Vitals reviewed.  Constitutional:      Appearance: Normal appearance.  Cardiovascular:     Rate and Rhythm: Normal rate and regular rhythm.  Pulmonary:     Effort: Pulmonary effort is normal.     Breath sounds: Normal breath sounds.  Musculoskeletal:     Comments: Full range of motion left hip with no reproducible pain  Skin:    Findings: Rash present.     Comments: She has only a few small discrete approximately 1 mm eschars right lower abdomen.  These do not fit any clear dermatome distribution.  No pustules.  No vesicles.  Neurological:  Mental Status: She is alert.     Comments: Sensation intact to touch at this time throughout lower extremity.  She has 1+  reflexes ankle and knee bilaterally.  Good strength with plantarflexion, dorsiflexion, knee extension bilaterally    BP 134/60 (BP Location: Left Arm, Patient Position: Sitting, Cuff Size: Normal)   Pulse (!) 107   Temp 98.3 F (36.8 C) (Oral)   Wt 157 lb (71.2 kg)   SpO2 99%   BMI 27.81 kg/m  Wt Readings from Last 3 Encounters:  07/14/21 157 lb (71.2 kg)  07/14/21 152 lb 6.4 oz (69.1 kg)  05/22/21 153 lb (69.4 kg)     Health Maintenance Due  Topic Date Due   COVID-19 Vaccine (4 - Booster for Moderna series) 11/11/2020   INFLUENZA VACCINE  04/27/2021    There are no preventive care reminders to display for this patient.  Lab Results  Component Value Date   TSH 3.95 07/09/2020   Lab Results  Component Value Date   WBC 5.7 11/27/2020   HGB 13.1 11/27/2020   HCT 39.9 11/27/2020   MCV 91 11/27/2020   PLT 173 11/27/2020   Lab Results  Component Value Date   NA 147 (H) 05/07/2021   K 4.0 05/07/2021   CO2 29 05/07/2021   GLUCOSE 96 05/07/2021   BUN 13 05/07/2021   CREATININE 0.86 05/07/2021   BILITOT 0.5 05/22/2021   ALKPHOS 115 05/22/2021   AST 27 05/22/2021   ALT 16 05/22/2021   PROT 6.9 05/22/2021   ALBUMIN 4.9 (H) 05/22/2021   CALCIUM 9.4 05/07/2021   ANIONGAP 11 09/06/2019   EGFR 67 05/07/2021   GFR 67.66 10/01/2019   Lab Results  Component Value Date   CHOL 146 05/22/2021   Lab Results  Component Value Date   HDL 56 05/22/2021   Lab Results  Component Value Date   LDLCALC 70 05/22/2021   Lab Results  Component Value Date   TRIG 109 05/22/2021   Lab Results  Component Value Date   CHOLHDL 2.6 05/22/2021   Lab Results  Component Value Date   HGBA1C 5.3 10/12/2016      Assessment & Plan:   #1 nonspecific rash right lower abdomen.  Doubt shingles.  This is more pruritic than painful.  We wrote for some triamcinolone 0.1% cream to apply twice daily  #2 left lower extremity pain and intermittent numbness left lower extremity.  Suspect  nerve impingement.  Remote history of lumbar back surgery.  Nonfocal neuro exam at this time. -Given duration of symptoms start with plain films lumbar spine -Not clear if she can have MRI if this is not improving as she may have some hardware in her lumbar spine which could interfere with imaging -Consider referral back to neurosurgeon depending on imaging above -Continue Tylenol for now which seems to be helping   Meds ordered this encounter  Medications   triamcinolone cream (KENALOG) 0.1 %    Sig: Apply 1 application topically 2 (two) times daily.    Dispense:  15 g    Refill:  0    Follow-up: No follow-ups on file.    [name], MD

## 2021-07-14 NOTE — Assessment & Plan Note (Signed)
Her BP was initially elevated but improved on repeat.  It has been controlled at home.  Continue regular exercise.  Continue metoprolol an doxazosin.

## 2021-07-14 NOTE — Assessment & Plan Note (Signed)
She is s/p mitral valve replacement.  Her valve was stable on echo 06/2021.

## 2021-07-14 NOTE — Chronic Care Management (AMB) (Signed)
Chronic Care Management Pharmacy Assistant   Name: Catherine [name]  MRN: [id number] DOB: Nov 06, 1934  Reason for Encounter: Disease State/ Hypertension Assessment Call.    Conditions to be addressed/monitored: HTN   Recent office visits:  07/14/21 [name] MD (PCP) - seen for lumbar pain. Patient started on triamcinolone acetonide 2 times daily. Follow up as needed.  06/29/21 [name] RN [facility]) - seen for anticoagulation office visit.   Recent consult visits:  07/14/21 [name] MD (Cardiology) - seen for A-Fib. No medication changes. Follow up in 4 months.    Hospital visits:  None in previous 6 months  Medications: Outpatient Encounter Medications as of 07/14/2021  Medication Sig   acetaminophen (TYLENOL) 500 MG tablet Take 500 mg by mouth daily.   albuterol (VENTOLIN HFA) 108 (90 Base) MCG/ACT inhaler Inhale 2 puffs into the lungs every 4 (four) hours as needed for wheezing or shortness of breath.   cephALEXin (KEFLEX) 250 MG capsule TAKE ONE CAPSULE ONCE DAILY   cholecalciferol (VITAMIN D3) 25 MCG (1000 UNIT) tablet Take 1 tablet (1,000 Units total) by mouth daily.   diazepam (VALIUM) 5 MG tablet TAKE ONE TABLET AT BEDTIME   doxazosin (CARDURA) 8 MG tablet Take 1 tablet (8 mg total) by mouth daily.   metoprolol succinate (TOPROL-XL) 25 MG 24 hr tablet TAKE 1 TABLET DAILY   Multiple Vitamins-Minerals (HAIR/SKIN/NAILS/BIOTIN PO) Take 1 tablet by mouth daily.   Multiple Vitamins-Minerals (MULTIVITAMIN ADULT EXTRA C) CHEW Chew 2 tablets by mouth daily.   potassium chloride (KLOR-CON) 10 MEQ tablet TAKE UP TO 8 TABLETS DAILY AS DIRECTED   rosuvastatin (CRESTOR) 5 MG tablet Take one tablet every Monday, wednesday Friday and Sunday   SYMBICORT 80-4.5 MCG/ACT inhaler 2 PUFFS 2 TIMES A DAY   torsemide (DEMADEX) 20 MG tablet Take 2 tablets (40mg  total) twice daily. You may take an extra tablet for a total dose of 3 tablets (60mg  total) twice daily as  needed for weight gain or swelling.   warfarin (COUMADIN) 2 MG tablet Take 1 tablet daily or take as directed by anticoagulation clinic   No facility-administered encounter medications on file as of 07/14/2021.   Fill History: Symbicort 80 mcg-4.5 mcg/actuation HFA aerosol inhaler 06/23/2021 30   doxazosin 8 mg tablet 04/06/2021 90   metoprolol succinate ER 25 mg tablet,extended release 24 hr 07/07/2021 90   potassium chloride ER 10 mEq tablet,extended release 04/17/2021 90   rosuvastatin 5 mg tablet 07/07/2021 58   torsemide 20 mg tablet 05/14/2021 90   warfarin 2 mg tablet 05/11/2021 90   diazepam 5 mg tablet 07/07/2021 30   Reviewed chart prior to disease state call. Spoke with patient regarding BP  Recent Office Vitals: BP Readings from Last 3 Encounters:  07/14/21 (!) 154/80  05/22/21 (!) 152/82  04/30/21 (!) 142/62   Pulse Readings from Last 3 Encounters:  07/14/21 (!) 58  05/22/21 60  04/30/21 (!) 57    Wt Readings from Last 3 Encounters:  07/14/21 152 lb 6.4 oz (69.1 kg)  05/22/21 153 lb (69.4 kg)  04/30/21 157 lb (71.2 kg)     Kidney Function Lab Results  Component Value Date/Time   CREATININE 0.86 05/07/2021 11:29 AM   CREATININE 0.92 11/27/2020 02:39 PM   CREATININE 0.94 (H) 07/29/2020 09:50 AM   CREATININE 0.71 07/09/2020 02:03 PM   GFR 67.66 10/01/2019 03:03 PM   GFRNONAA 59 (L) 10/29/2020 12:10 PM   GFRAA 67 10/29/2020 12:10 PM  BMP Latest Ref Rng & Units 05/07/2021 11/27/2020 10/29/2020  Glucose 65 - 99 mg/dL 96 89 120(H)  BUN 8 - 27 mg/dL 13 14 16   Creatinine 0.57 - 1.00 mg/dL 0.86 0.92 0.91  BUN/Creat Ratio 12 - 28 15 15 18   Sodium 134 - 144 mmol/L 147(H) 147(H) 144  Potassium 3.5 - 5.2 mmol/L 4.0 3.5 3.5  Chloride 96 - 106 mmol/L 102 104 101  CO2 20 - 29 mmol/L 29 23 26   Calcium 8.7 - 10.3 mg/dL 9.4 9.4 9.3    Current antihypertensive regimen:  Metoprolol succinate 25mg  - take 1 tablet daily.  Torsemide 20mg  - Take 2 tablets (40mg   total) twice daily. You may take an extra tablet for a total dose of 3 tablets (60mg  total) twice daily as needed for weight gain or swelling. How often are you checking your Blood Pressure? 3-5x per week Current home BP readings: 06/30/21 133/59 P:63 AM, 07/02/21 124/55 P:64 AM, 07/09/21 140/64 P:66 AM.  What recent interventions/DTPs have been made by any provider to improve Blood Pressure control since last CPP Visit: None.  Any recent hospitalizations or ED visits since last visit with CPP? No  Adherence Review: Is the patient currently on ACE/ARB medication? No Does the patient have >5 day gap between last estimated fill dates? No  Notes: Spoke with patient and reviewed all medications as prescribed. Patient reports taking all medications as prescribed and no issues at this time. Patient stated she eats cereal and a orange for breakfast. She has some crackers for lunch. She makes a lot of homemade soups this time of year for dinner. She does not eat a lot of meat at all or add salt to her food. Patient has been having issues with her back again and has been unable to do silver sneakers class. She is able to do things around home but that's about it. Patient thanked me for my call.   Care Gaps:  AWV - completed on 04/07/21 Covid-19 vaccine booster 4 - overdue since 11/11/20 Flu vaccine - due Last PCP BP: 134/60 P: 107 on 07/14/21  Star Rating Drugs:  Rosuvastatin 5mg  - last filled on 07/07/21 58DS at [facility].  [place]  Clinical Pharmacist Assistant [phone]

## 2021-07-14 NOTE — Assessment & Plan Note (Signed)
Stable without angina.  Continue rosuvastatin and metoprolol.  Continue exercising regularly.

## 2021-07-14 NOTE — Patient Instructions (Addendum)
Medication Instructions:  IF YOUR WEIGHT GETS TO 152 LBS TAKE TORSEMIDE 60 MG FOR THAT DAY   *If you need a refill on your cardiac medications before your next appointment, please call your pharmacy*  Lab Work: NONE  Testing/Procedures: NONE   Follow-Up: At [facility], you and your health needs are our priority.  As part of our continuing mission to provide you with exceptional heart care, we have created designated Provider Care Teams.  These Care Teams include your primary Cardiologist (physician) and Advanced Practice Providers (APPs -  Physician Assistants and Nurse Practitioners) who all work together to provide you with the care you need, when you need it.  We recommend signing up for the patient portal called "MyChart".  Sign up information is provided on this After Visit Summary.  MyChart is used to connect with patients for Virtual Visits (Telemedicine).  Patients are able to view lab/test results, encounter notes, upcoming appointments, etc.  Non-urgent messages can be sent to your provider as well.   To learn more about what you can do with MyChart, go to [email or web address].    Your next appointment:   11/19/2021 AT 9:40 WITH DR [facility]

## 2021-07-14 NOTE — Progress Notes (Signed)
Cardiology Office Note   Date:  07/14/2021   ID:  ETTY ISAAC, DOB [birth year], MRN [id number]  PCP:  [name], MD  Cardiologist:   [name], MD  Cardiothoracic surgeon: Dr. Roxy Manns  No chief complaint on file.    History of Present Illness: Catherine [name] is a 86 y.o. 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 Catherine [name] was previously a patient of [name]. She underwent mitral commissurotomy in the 1970s. On her echocardiogram 02/2014 she had an ejection fraction of 60-65% with moderate to severe mitral regurgitation. There was also mild aortic regurgitation. Her follow-up echo 02/2015 showed an EF of 60-65% with moderate to severe mitral regurgitation, moderate tricuspid regurgitation and elevated pulmonary artery pressures.  Repeat echo 9/11/7 revealed moderate pulmonary stenosis with moderate to severe mitral regurgitation.  She did not have any pulmonary hypertension and her LV was not dilated.  She was referred for left and right heart catheterization. She was found to have severe mitral stenosis with a 13 mmHg mean gradient, MVA 0.99 cm^2. She also had 50% LCx and 80% RCA lesions.  She had a 29 mm bioprosthetic mitral valve, single vessel CABG and MAZE with Dr. Roxy Manns on 10/14/16.  She was referred for a baseline echo 02/15/17 that revealed LVEF 60-65% with mild aortic stenosis (mean gradient 12 mmHg).  Her bioprosthetic mitral valve was functioning well.    After her surgery Catherine [name] continued to report fatigue.  It was unclear whether she was in atrial fibrillation or sinus rhythm with a long PR interval.  She was referred to the atrial fibrillation clinic where this remained unclear.  However, it was felt that if she were in atrial fibrillation it would not be possible to maintain sinus rhythm.  She  developed lower extremity edema. She stopped amlodipine and this improved. This was switched to irbesartan. She then developed diaphoresis and insomnia that she attributed to this medication.    She was switched from lasix to torsemide which helped her abdominal bloating but she still has LE edema. She has followed up with Catherine Montana NP and noted increased LE edema and hip pain. She also reported dizziness and had a repeat echo 06/2021 with LV 60-65%. Her bioprosthetic mitral valve was functioning properly. She had moderate to severe tricuspid regurgitation and PASP was 40mmHg. Today, she appears to be doing well. Her blood pressure at home ranges from 110s-150s/50s-60s but averages 400Q-676P systolic. These readings are better than what was captured in clinic. She reports feeling anxious today from driving to clinic on her own. Her LE edema used to swell with associated bilateral pain and numbness. She weighed 149 lbs this morning. Since starting her medication, she has lost 5lbs in 3 days and noted her legs have been feeling better. She walks and elevates her legs at home to help with the swelling. She has been feeling short of breath but taking torsemide helps with these symptoms. For formal exercise, she goes to the gym. However, she was not able to go 07/13/2021 because of rashes on her R hip and posterior L leg she describes as itching and burning. She denies any palpitations, chest pain, lightheadedness, headaches, syncope, orthopnea, PND, or exertional symptoms. Of note, she avoids coffee since her COVID-19 infection has resolved.  Past Medical History:  Diagnosis Date   Acute on  chronic diastolic heart failure (HCC)    Aortic stenosis, mild 07/26/2017   Arthritis    Asthma    last attack 02/2015   Atrial fibrillation, chronic (HCC)    Cellulitis of left lower extremity 08/17/2016   Ulcer associated with severe venous insufficiency   Chronic anticoagulation    Chronic diastolic CHF  (congestive heart failure) ([place])    Chronic kidney disease    "RIGHT MANY KIDNEY INFECTIONS AND STONES"   COPD (chronic obstructive pulmonary disease) (HCC)    Coronary artery disease    Dizziness    H/O: rheumatic fever    Heart murmur    Hypertension    PONV (postoperative nausea and vomiting)    ' SOMETIMES', BUT NOT ALWAYS"   S/P Maze operation for atrial fibrillation 10/14/2016   Complete bilateral atrial lesion set using cryothermy and bipolar radiofrequency ablation - atrial appendage was not treated due to previous surgical procedure (open mitral commissurotomy)   S/P mitral valve replacement with bioprosthetic valve 10/14/2016   29 mm Medtronic Mosaic porcine bioprosthetic tissue valve   Tricuspid regurgitation 07/14/2021   UTI (urinary tract infection)    Valvular heart disease    Has mitral stenosis with prior mitral commissurotomy in 1970    Past Surgical History:  Procedure Laterality Date   ABDOMINAL HYSTERECTOMY  1983   endometriosis   APPENDECTOMY     BACK SURGERY     neurosurgery x2   CARDIAC CATHETERIZATION     CARDIAC CATHETERIZATION N/A 08/03/2016   Procedure: Right/Left Heart Cath and Coronary Angiography;  Surgeon: Peter M Martinique, MD;  Location: [facility];  Service: Cardiovascular;  Laterality: N/A;   cataract surg     CHOLECYSTECTOMY N/A 05/30/2015   Procedure: LAPAROSCOPIC CHOLECYSTECTOMY WITH INTRAOPERATIVE CHOLANGIOGRAM;  Surgeon: Excell Seltzer, MD;  Location: [place] [facility];  Service: General;  Laterality: N/A;   COLONOSCOPY     CORONARY ARTERY BYPASS GRAFT N/A 10/14/2016   Procedure: CORONARY ARTERY BYPASS GRAFTING (CABG);  Surgeon: Rexene Alberts, MD;  Location: [place];  Service: Open Heart Surgery;  Laterality: N/A;   EYE SURGERY     MAZE N/A 10/14/2016   Procedure: MAZE;  Surgeon: Rexene Alberts, MD;  Location: [facility];  Service: Open Heart Surgery;  Laterality: N/A;   MITRAL VALVE REPLACEMENT N/A 10/14/2016   Procedure: REDO MITRAL VALVE REPLACEMENT  (MVR);  Surgeon: Rexene Alberts, MD;  Location: [facility];  Service: Open Heart Surgery;  Laterality: N/A;   MITRAL VALVE SURGERY Left 1970   Open mitral commissurotomy via left thoracotomy approach   TEE WITHOUT CARDIOVERSION N/A 07/05/2016   Procedure: TRANSESOPHAGEAL ECHOCARDIOGRAM (TEE);  Surgeon: [name], MD;  Location: [place];  Service: Cardiovascular;  Laterality: N/A;   TEE WITHOUT CARDIOVERSION N/A 10/14/2016   Procedure: TRANSESOPHAGEAL ECHOCARDIOGRAM (TEE);  Surgeon: Rexene Alberts, MD;  Location: [place];  Service: Open Heart Surgery;  Laterality: N/A;    Current Outpatient Medications  Medication Sig Dispense Refill   acetaminophen (TYLENOL) 500 MG tablet Take 500 mg by mouth daily.     albuterol (VENTOLIN HFA) 108 (90 Base) MCG/ACT inhaler Inhale 2 puffs into the lungs every 4 (four) hours as needed for wheezing or shortness of breath. 1 each 2   cephALEXin (KEFLEX) 250 MG capsule TAKE ONE CAPSULE ONCE DAILY 30 capsule 3   cholecalciferol (VITAMIN D3) 25 MCG (1000 UNIT) tablet Take 1 tablet (1,000 Units total) by mouth daily.     diazepam (VALIUM) 5 MG tablet  TAKE ONE TABLET AT BEDTIME 30 tablet 5   doxazosin (CARDURA) 8 MG tablet Take 1 tablet (8 mg total) by mouth daily. 90 tablet 3   metoprolol succinate (TOPROL-XL) 25 MG 24 hr tablet TAKE 1 TABLET DAILY 90 tablet 1   Multiple Vitamins-Minerals (HAIR/SKIN/NAILS/BIOTIN PO) Take 1 tablet by mouth daily.     Multiple Vitamins-Minerals (MULTIVITAMIN ADULT EXTRA C) CHEW Chew 2 tablets by mouth daily.     potassium chloride (KLOR-CON) 10 MEQ tablet TAKE UP TO 8 TABLETS DAILY AS DIRECTED 720 tablet 3   rosuvastatin (CRESTOR) 5 MG tablet Take one tablet every Monday, wednesday Friday and Sunday 30 tablet 11   SYMBICORT 80-4.5 MCG/ACT inhaler 2 PUFFS 2 TIMES A DAY 10.2 g 3   torsemide (DEMADEX) 20 MG tablet Take 2 tablets (40mg  total) twice daily. You may take an extra tablet for a total dose of 3 tablets (60mg  total) twice  daily as needed for weight gain or swelling. 390 tablet 1   warfarin (COUMADIN) 2 MG tablet Take 1 tablet daily or take as directed by anticoagulation clinic 100 tablet 1   triamcinolone cream (KENALOG) 0.1 % Apply 1 application topically 2 (two) times daily. 15 g 0   No current facility-administered medications for this visit.    Allergies:   Aldactone [spironolactone], Amoxicillin, Diltiazem, Flagyl [metronidazole hcl], Flovent [fluticasone propionate], Gabapentin, Lyrica [pregabalin], Quinidine, Simvastatin, Tramadol, Verapamil, Amlodipine, Crestor [rosuvastatin], Livalo [pitavastatin], Zetia [ezetimibe], Ace inhibitors, Benazepril hcl, Ciprocin-fluocin-procin [fluocinolone acetonide], Ciprofloxacin, Codeine, and Nitrofurantoin monohyd macro    Social History:  The patient  reports that she quit smoking about 45 years ago. Her smoking use included cigarettes. She has a 15.00 pack-year smoking history. She has never used smokeless tobacco. She reports that she does not drink alcohol and does not use drugs.   Family History:  The patient's family history includes Leukemia in her father.    ROS:  Please see the history of present illness.   (+) Stress (+) LE edema (L>R) (+) Itchy, Burning Rash (R hip, posterior L leg) All other systems are reviewed and negative.    PHYSICAL EXAM: VS:  BP (!) 154/80 (BP Location: Right Arm, Patient Position: Sitting, Cuff Size: Normal)   Pulse (!) 58   Ht 5\' 3"  (1.6 m)   Wt 152 lb 6.4 oz (69.1 kg)   SpO2 95%   BMI 27.00 kg/m  , BMI Body mass index is 27 kg/m. GENERAL:  Well appearing HEENT: Pupils equal round and reactive, fundi not visualized, oral mucosa unremarkable NECK:  No jugular venous distention, waveform within normal limits, carotid upstroke brisk and symmetric, no bruits LUNGS:  Clear to auscultation bilaterally HEART:  RRR.  PMI not displaced or sustained,S1 and S2 within normal limits, no S3, no S4, no clicks, no rubs,  III/VI systolic  murmur at the LUSB ABD:   Abdomen flat. Positive bowel sounds normal in frequency in pitch, no bruits, no rebound, no guarding, no midline pulsatile mass, no hepatomegaly, no splenomegaly EXT:  2 plus pulses throughout,  LLE edema (L>R), no cyanosis no clubbing SKIN:  Chronic stasis dermatitis NEURO:  Cranial nerves II through XII grossly intact, motor grossly intact throughout PSYCH:  Cognitively intact, oriented to person place and time  EKG:   The ekg ordered 05/21/16 demonstrates atrial fibrillateion.  Prior septal infarct.  Rate 60 bpm.  Unchanged from 10/22/15. 10/27/16: Sinus rhythm.  Rate 60 bpm.  First degree heart block. 02/01/17: Sinus bradycardia with sinus arrhythmia and first  degree block.   03/08/17: Sinus bradycardia with sinus arrhythmia.   First degree heart block.  Junctional escape.  QTc 497 ms.   04/26/17: Atrial fibrillation.  Rate 69 bpm. 01/05/18: Accelerated junctional rhythm.  Rate 68 bpm.  Incomplete RBBB.  10/10/18: Sinus rhythm.  Rate 89 bpm.  Nonspecific ST changes.  06/06/19: Atrial fibrillation.  Rate 61 bpm.  Prior septal infarct. 01/10/20: Atrial fibrillation. Rate 56 bpm.  Prior septal infarct. 07/21/20: Atrial fibrillation.  Rate 70 bpm.  Prior septal infarct. 07/14/2021:  No EKG was ordered today  Echo 07/07/2021 1. Left ventricular ejection fraction, by estimation, is 60 to 65%. The  left ventricle has normal function. The left ventricle has no regional  wall motion abnormalities. Left ventricular diastolic function could not  be evaluated.   2. Right ventricular systolic function is mildly reduced. The right  ventricular size is mildly enlarged. There is mildly elevated pulmonary  artery systolic pressure. The estimated right ventricular systolic  pressure is 76.2 mmHg.   3. Left atrial size was severely dilated.   4. Right atrial size was severely dilated.   5. 29 mm Medtronic Mosaic Bioprosthesis in mitral position (10/14/2016).  MG 5.5 @ 55 bpm. E max 2.1  m/s, PHT ~120 msec, EOA 2.1 cm2, DI 1.36. All  parameters are within normal limits for this prosthetic valve. The mitral  valve has been repaired/replaced.  No evidence of mitral valve regurgitation. Procedure Date: 10/14/2016.   6. There is at least moderate TR. there is systolic flow reversal in the  hepatic veins suggestive of significant TR. Interrogation poor on this  study. Tricuspid valve regurgitation is moderate to severe.   7. The aortic valve is tricuspid. There is mild calcification of the  aortic valve. There is mild thickening of the aortic valve. Aortic valve  regurgitation is not visualized. Mild to moderate aortic valve  sclerosis/calcification is present, without any  evidence of aortic stenosis.   8. The inferior vena cava is dilated in size with >50% respiratory  variability, suggesting right atrial pressure of 8 mmHg.   Echo 09/2019: 1. Left ventricular ejection fraction, by visual estimation, is 65 to  70%. The left ventricle has hyperdynamic function. Left ventricular septal  wall thickness was mildly increased. There is mildly increased left  ventricular hypertrophy.   2. The left ventricle has no regional wall motion abnormalities.   3. Global right ventricle has normal systolic function.The right  ventricular size is normal. No increase in right ventricular wall  thickness.   4. Left atrial size was moderately dilated.   5. Right atrial size was moderately dilated.   6. The mitral valve has been repaired/replaced. No evidence of mitral  valve regurgitation. No evidence of mitral stenosis.   7. Patient has a 29 mm biprosthetic valve functioning normally with no  PVL The mitral struts protrude into the LVOT quite a bit likely  contributing to some of the gradient across the LVOT/AV. Creates a small  "neointimal LVOT" area.   8. The tricuspid valve is normal in structure.   9. The aortic valve is tricuspid. Aortic valve regurgitation is not  visualized. Mild  aortic valve stenosis.  10. Pulmonic regurgitation is mild.  11. The pulmonic valve was grossly normal. Pulmonic valve regurgitation is  mild.  12. Mildly elevated pulmonary artery systolic pressure.   Echo 07/06/18: Study Conclusions   - Left ventricle: The cavity size was normal. Systolic function was   vigorous. The estimated ejection fraction was  in the range of 65%   to 70%. Wall motion was normal; there were no regional wall   motion abnormalities. Doppler parameters are consistent with high   ventricular filling pressure. - Aortic valve: Trileaflet; mildly thickened, mildly calcified   leaflets. There was mild stenosis. Peak velocity (S): 266 cm/s.   Mean gradient (S): 16 mm Hg. - Mitral valve: A bioprosthesis was present and functioning   normally. - Left atrium: The atrium was severely dilated. Anterior-posterior   dimension: 60 mm. - Tricuspid valve: There was mild regurgitation.    LHC/RHC 08/03/16: The left ventricular systolic function is normal. LV end diastolic pressure is normal. The left ventricular ejection fraction is 55-65% by visual estimate. There is no aortic valve stenosis. There is severe mitral valve stenosis. Prox Cx to Mid Cx lesion, 50 %stenosed. Mid RCA-2 lesion, 80 %stenosed. Mid RCA-1 lesion, 80 %stenosed. Hemodynamic findings consistent with mild pulmonary hypertension. LV end diastolic pressure is normal.   1. Coronary artery disease   - 50% mid LCx   - 80% sequential lesions in the mid RCA 2. Normal LV function 3. Severe mitral stenosis. MV gradient of 13 mm Hg. MVA 0.99 cm squared with index 0.57. 4. Moderate to severe mitral insufficiency 5. Mild pulmonary HTN. 6. Normal LV EDP 7. No significant AV gradient 8. Occluded right brachial artery.  Carotid Dopplers 10/12/16: 1-39% bilateral ICA stenosis  7 Day Event Monitor 06/28/17:   Quality: Fair.  Baseline artifact. Predominant rhythm: Sinus rhythm with PACs and sinus arrhythmia.      Cannot  rule out episodes of atrial fibrillation.  Recent Labs: 11/27/2020: Hemoglobin 13.1; Magnesium 2.2; Platelets 173 05/07/2021: BNP 143.1; BUN 13; Creatinine, Ser 0.86; Potassium 4.0; Sodium 147 05/22/2021: ALT 16    Lipid Panel    Component Value Date/Time   CHOL 146 05/22/2021 0843   TRIG 109 05/22/2021 0843   HDL 56 05/22/2021 0843   CHOLHDL 2.6 05/22/2021 0843   CHOLHDL 2.8 07/09/2020 1403   VLDL 42.8 (H) 09/24/2019 1054   LDLCALC 70 05/22/2021 0843   LDLCALC 77 07/09/2020 1403   LDLDIRECT 68.0 09/24/2019 1054      Wt Readings from Last 3 Encounters:  07/14/21 157 lb (71.2 kg)  07/14/21 152 lb 6.4 oz (69.1 kg)  05/22/21 153 lb (69.4 kg)     ASSESSMENT AND PLAN:  Atrial fibrillation (HCC) She remains in sinus rhythm.  Continue warfarin and metoprolol.  S/P CABG x 1 Stable without angina.  Continue rosuvastatin and metoprolol.  Continue exercising regularly.  S/P mitral valve replacement with bioprosthetic valve + CABG x1 + maze procedure She is s/p mitral valve replacement.  Her valve was stable on echo 06/2021.  Tricuspid regurgitation She has moderate to severe tricuspid regurgitation.  Lately her weight has been stable.  Recommend that she take 60 mg of torsemide twice daily if her weight gets up to 152 pounds.  Otherwise, continue 40 mg twice a day.   Current medicines are reviewed at length with the patient today.  The patient does not have concerns regarding medicines.  The following changes have been made:  none  Labs/ tests ordered today include:   No orders of the defined types were placed in this encounter.   Disposition:   FU with Handsome Anglin C. Oval Linsey, MD, [facility] in 4 months.   I,Mykaella Javier,acting as a scribe for [name], MD.,have documented all relevant documentation on the behalf of [name], MD,as directed by  [name], MD while  in the presence of [name], MD.  I, [place]ter[place] Oval Linsey, MD have  reviewed all documentation for this visit.  The documentation of the exam, diagnosis, procedures, and orders on 07/14/2021 are all accurate and complete.    Signed, Byrl Latin C. Oval Linsey, MD, [facility]  07/14/2021 6:39 PM    [place]

## 2021-07-14 NOTE — Assessment & Plan Note (Signed)
She has moderate to severe tricuspid regurgitation.  Lately her weight has been stable.  Recommend that she take 60 mg of torsemide twice daily if her weight gets up to 152 pounds.  Otherwise, continue 40 mg twice a day.

## 2021-07-14 NOTE — Assessment & Plan Note (Signed)
She remains in sinus rhythm.  Continue warfarin and metoprolol.

## 2021-07-15 ENCOUNTER — Ambulatory Visit: Payer: Medicare Other | Admitting: Family Medicine

## 2021-07-15 NOTE — Telephone Encounter (Signed)
Please advise. [place] for a refill?

## 2021-07-20 ENCOUNTER — Other Ambulatory Visit (HOSPITAL_BASED_OUTPATIENT_CLINIC_OR_DEPARTMENT_OTHER): Payer: Self-pay | Admitting: Cardiovascular Disease

## 2021-07-29 DIAGNOSIS — M5441 Lumbago with sciatica, right side: Secondary | ICD-10-CM | POA: Diagnosis not present

## 2021-07-29 DIAGNOSIS — G8929 Other chronic pain: Secondary | ICD-10-CM | POA: Diagnosis not present

## 2021-07-29 DIAGNOSIS — M5416 Radiculopathy, lumbar region: Secondary | ICD-10-CM | POA: Diagnosis not present

## 2021-07-29 DIAGNOSIS — M5442 Lumbago with sciatica, left side: Secondary | ICD-10-CM | POA: Diagnosis not present

## 2021-07-29 DIAGNOSIS — I10 Essential (primary) hypertension: Secondary | ICD-10-CM | POA: Diagnosis not present

## 2021-08-10 ENCOUNTER — Ambulatory Visit (INDEPENDENT_AMBULATORY_CARE_PROVIDER_SITE_OTHER): Payer: Medicare Other

## 2021-08-10 ENCOUNTER — Ambulatory Visit: Payer: Medicare Other

## 2021-08-10 DIAGNOSIS — Z7901 Long term (current) use of anticoagulants: Secondary | ICD-10-CM | POA: Diagnosis not present

## 2021-08-10 LAB — POCT INR: INR: 2.2 (ref 2.0–3.0)

## 2021-08-10 NOTE — Patient Instructions (Addendum)
Pre visit review using our clinic review tool, if applicable. No additional management support is needed unless otherwise documented below in the visit note.  Continue to take 1 tablet daily except 1/2 tablet on Wednesday and Saturday.  Re-check in 6 weeks.

## 2021-08-10 NOTE — Progress Notes (Signed)
Continue to take 1 tablet daily except 1/2 tablet on Wednesday and Saturday.  Re-check in 6 weeks.

## 2021-08-11 ENCOUNTER — Encounter: Payer: Self-pay | Admitting: Physical Therapy

## 2021-08-11 ENCOUNTER — Other Ambulatory Visit: Payer: Self-pay

## 2021-08-11 ENCOUNTER — Ambulatory Visit: Payer: Medicare Other | Attending: Neurosurgery | Admitting: Physical Therapy

## 2021-08-11 DIAGNOSIS — M5442 Lumbago with sciatica, left side: Secondary | ICD-10-CM | POA: Diagnosis not present

## 2021-08-11 NOTE — Therapy (Signed)
[facility] [place], [place], [address] Phone: [phone]   Fax:  [phone]  Physical Therapy Evaluation  Patient Details  Name: Catherine [name] MRN: [id number] Date of Birth: [birth year] Referring Provider (PT): Fenton Malling NP   Encounter Date: 08/11/2021   PT End of Session - 08/11/21 1354     Visit Number 1    Number of Visits 12    Date for PT Re-Evaluation 09/22/21    PT Start Time 0107    PT Stop Time 0154    PT Time Calculation (min) 47 min    Activity Tolerance Patient tolerated treatment well    Behavior During Therapy [facility] for tasks assessed/performed             Past Medical History:  Diagnosis Date   Acute on chronic diastolic heart failure ([place])    Aortic stenosis, mild 07/26/2017   Arthritis    Asthma    last attack 02/2015   Atrial fibrillation, chronic ([place])    Cellulitis of left lower extremity 08/17/2016   Ulcer associated with severe venous insufficiency   Chronic anticoagulation    Chronic diastolic CHF (congestive heart failure) ([place])    Chronic kidney disease    "RIGHT MANY KIDNEY INFECTIONS AND STONES"   COPD (chronic obstructive pulmonary disease) ([place])    Coronary artery disease    Dizziness    H/O: rheumatic fever    Heart murmur    Hypertension    PONV (postoperative nausea and vomiting)    ' SOMETIMES', BUT NOT ALWAYS"   S/P Maze operation for atrial fibrillation 10/14/2016   Complete bilateral atrial lesion set using cryothermy and bipolar radiofrequency ablation - atrial appendage was not treated due to previous surgical procedure (open mitral commissurotomy)   S/P mitral valve replacement with bioprosthetic valve 10/14/2016   29 mm Medtronic Mosaic porcine bioprosthetic tissue valve   Tricuspid regurgitation 07/14/2021   UTI (urinary tract infection)    Valvular heart disease    Has mitral stenosis with prior mitral commissurotomy in 1970    Past Surgical History:   Procedure Laterality Date   ABDOMINAL HYSTERECTOMY  1983   endometriosis   APPENDECTOMY     BACK SURGERY     neurosurgery x2   CARDIAC CATHETERIZATION     CARDIAC CATHETERIZATION N/A 08/03/2016   Procedure: Right/Left Heart Cath and Coronary Angiography;  Surgeon: Peter M Martinique, MD;  Location: [facility];  Service: Cardiovascular;  Laterality: N/A;   cataract surg     CHOLECYSTECTOMY N/A 05/30/2015   Procedure: LAPAROSCOPIC CHOLECYSTECTOMY WITH INTRAOPERATIVE CHOLANGIOGRAM;  Surgeon: Excell Seltzer, MD;  Location: [facility];  Service: General;  Laterality: N/A;   COLONOSCOPY     CORONARY ARTERY BYPASS GRAFT N/A 10/14/2016   Procedure: CORONARY ARTERY BYPASS GRAFTING (CABG);  Surgeon: Rexene Alberts, MD;  Location: [place];  Service: Open Heart Surgery;  Laterality: N/A;   EYE SURGERY     MAZE N/A 10/14/2016   Procedure: MAZE;  Surgeon: Rexene Alberts, MD;  Location: [facility];  Service: Open Heart Surgery;  Laterality: N/A;   MITRAL VALVE REPLACEMENT N/A 10/14/2016   Procedure: REDO MITRAL VALVE REPLACEMENT (MVR);  Surgeon: Rexene Alberts, MD;  Location: [facility];  Service: Open Heart Surgery;  Laterality: N/A;   MITRAL VALVE SURGERY Left 1970   Open mitral commissurotomy via left thoracotomy approach   TEE WITHOUT CARDIOVERSION N/A 07/05/2016   Procedure: TRANSESOPHAGEAL ECHOCARDIOGRAM (TEE);  Surgeon: [name],  MD;  Location: [facility];  Service: Cardiovascular;  Laterality: N/A;   TEE WITHOUT CARDIOVERSION N/A 10/14/2016   Procedure: TRANSESOPHAGEAL ECHOCARDIOGRAM (TEE);  Surgeon: Rexene Alberts, MD;  Location: [facility];  Service: Open Heart Surgery;  Laterality: N/A;    There were no vitals filed for this visit.    Subjective Assessment - 08/11/21 1331     Subjective COVID-19 screen performed prior to patient entering clinic.  The patient presents to the clinic today with c/o left-sided low back over the last three months.  She states it came on gradually.  She rates her  pain at an 8/10.  She states that there are times she will experience an intense pain in her left buttock and she has to sit for at least 10-15 minutes before she can walk again.  This also reports in numbness over the "whole" left LE.  Sitting and lying down decreases her pain.    Pertinent History Back surgery x 2, CABG, Mitral valave replacement, COPD, HTN.    How long can you walk comfortably? Varies.  Pain hits suddenly without warning.    Diagnostic tests MRI scheduled.    Patient Stated Goals Get out of pain.    Currently in Pain? Yes    Pain Score 8     Pain Location Buttocks   Left buttock.   Pain Orientation Left    Pain Descriptors / Indicators Throbbing;Numbness;Sharp    Pain Type Acute pain    Pain Onset More than a month ago    Pain Frequency Intermittent    Aggravating Factors  See above.    Pain Relieving Factors See above.                [facility] PT Assessment - 08/11/21 0001       Assessment   Medical Diagnosis Lumbar radiculopathy    Referring Provider (PT) Fenton Malling NP    Onset Date/Surgical Date --   ~3 months ago.     Precautions   Precautions None      Restrictions   Weight Bearing Restrictions No      Balance Screen   Has the patient fallen in the past 6 months No    Has the patient had a decrease in activity level because of a fear of falling?  No    Is the patient reluctant to leave their home because of a fear of falling?  No      Home Ecologist residence      Prior Function   Level of Independence Independent      Posture/Postural Control   Posture/Postural Control No significant limitations      Deep Tendon Reflexes   DTR Assessment Site Patella;Achilles    Patella DTR 1+    Achilles DTR 1+      ROM / Strength   AROM / PROM / Strength AROM;Strength      AROM   Overall AROM Comments Full active lumbar flexion and active extension is to 15 degrees.      Strength   Overall Strength Comments  Normal LE strength.      Palpation   Palpation comment Minimal at most left low back and buttock pain.  Her pain appears to be referred in nature.      Special Tests   Other special tests Equal leg lengths. (-) SLR testing. (-) FABER testing.      Bed Mobility   Bed Mobility Sit to Supine  Sit to Supine Independent      Ambulation/Gait   Gait Comments Essentially normal at time of evaluation.                        Objective measurements completed on examination: See above findings.       [facility] Adult PT Treatment/Exercise - 08/11/21 0001       Modalities   Modalities Electrical Stimulation;Moist Heat      Moist Heat Therapy   Number Minutes Moist Heat 15 Minutes    Moist Heat Location Lumbar Spine      Electrical Stimulation   Electrical Stimulation Location Left low back.    Electrical Stimulation Action IFC at 80-150 Hz.    Electrical Stimulation Parameters 40% scan x 15 minutes.    Electrical Stimulation Goals Pain;Tone                          PT Long Term Goals - 08/11/21 1347       PT LONG TERM GOAL #1   Title Independent with a HEP.    Time 6    Period Weeks    Status New      PT LONG TERM GOAL #2   Title Perform ADL's with pain not > 3/10.    Time 6    Period Weeks    Status New      PT LONG TERM GOAL #3   Title Walk a community distance without experiencing left buttock pain and pain not > 3/10.    Time 6    Period Weeks    Status New                    Plan - 08/11/21 1341     Clinical Impression Statement The patient presents to [facility] with c/o pain that can be sever in her left buttock that causes her left LE to go numb.  She reports that came on gradually over the last three months.  Her pain appears to be referred in nature as she has very minimal palpable pain.   Special testing is negative and Patellar/Achilles DTR's are 1+/4+.  Her LE strength is normal.  When the pain strikes her she is  essentially unable to walk for 10 to 15 minutes.  [name] for an MRI and may receive an injection. Patient will benefit from skilled physical therapy intervention to address pain and deficits.    Personal Factors and Comorbidities Comorbidity 1;Other    Comorbidities Back surgery x 2, CABG, Mitral valave replacement, COPD, HTN.    Examination-Activity Limitations Other;Locomotion Level    Examination-Participation Restrictions Other    Stability/Clinical Decision Making Evolving/Moderate complexity    Clinical Decision Making Low    Rehab Potential Good    PT Frequency 2x / week    PT Duration 6 weeks    PT Treatment/Interventions ADLs/Self Care Home Management;Cryotherapy;Electrical Stimulation;Ultrasound;Moist Heat;Therapeutic activities;Therapeutic exercise;Manual techniques;Patient/family education;Passive range of motion    PT Next Visit Plan Conservative treatments and STW/M as needed to decrease pain with slow progression into low-level core exercies.    Consulted and Agree with Plan of Care Patient             Patient will benefit from skilled therapeutic intervention in order to improve the following deficits and impairments:  Pain, Decreased activity tolerance, Postural dysfunction  Visit Diagnosis: Acute left-sided low back pain with left-sided sciatica - Plan:  PT plan of care cert/re-cert     Problem List Patient Active Problem List   Diagnosis Date Noted   Tricuspid regurgitation 07/14/2021   Protein-calorie malnutrition, severe 09/05/2019   Dehydration    Gastroenteritis    Enteritis 09/03/2019   COVID-19 08/27/2019   Dysuria 07/07/2019   Chronic anxiety 12/23/2017   Aortic stenosis, mild 07/26/2017   Dyslipidemia 12/20/2016   S/P mitral valve replacement with bioprosthetic valve + CABG x1 + maze procedure 10/14/2016   S/P Maze operation for atrial fibrillation 10/14/2016   S/P CABG x 1 10/14/2016   Cellulitis of left lower extremity 08/17/2016    Acute on chronic diastolic heart failure (HCC)    Coronary artery disease    Chronic insomnia 07/12/2016   DOE (dyspnea on exertion) 03/26/2015   Warfarin-induced coagulopathy ([place]) 03/16/2015   Valvular heart disease 03/16/2015   COPD GOLD II  03/16/2015   Nephrolithiasis 03/16/2015   Essential hypertension    RHEUMATIC MITRAL STENOSIS 01/05/2008   Rheumatic heart disease 01/05/2008   Atrial fibrillation ([place]) 01/05/2008    Tandy Lewin, [place], PT 08/11/2021, 1:57 PM  [facility] [address] [place], [place], [address] Phone: [phone]   Fax:  [phone]  Name: Catherine [name] MRN: [id number] Date of Birth: 1935/08/07

## 2021-08-18 ENCOUNTER — Ambulatory Visit: Payer: Medicare Other | Admitting: Physical Therapy

## 2021-08-18 ENCOUNTER — Encounter: Payer: Self-pay | Admitting: Physical Therapy

## 2021-08-18 ENCOUNTER — Other Ambulatory Visit: Payer: Self-pay

## 2021-08-18 DIAGNOSIS — M5442 Lumbago with sciatica, left side: Secondary | ICD-10-CM | POA: Diagnosis not present

## 2021-08-18 NOTE — Therapy (Signed)
[facility] [place], [place], [address] Phone: [phone]   Fax:  [phone]  Physical Therapy Treatment  Patient Details  Name: Catherine [name] MRN: [id number] Date of Birth: 13-Jun-1935 Referring Provider (PT): Fenton Malling NP   Encounter Date: 08/18/2021   PT End of Session - 08/18/21 1304     Visit Number 2    Number of Visits 12    Date for PT Re-Evaluation 09/22/21    PT Start Time 1304    PT Stop Time 6962    PT Time Calculation (min) 41 min    Activity Tolerance Patient tolerated treatment well    Behavior During Therapy [facility] for tasks assessed/performed             Past Medical History:  Diagnosis Date   Acute on chronic diastolic heart failure ([place])    Aortic stenosis, mild 07/26/2017   Arthritis    Asthma    last attack 02/2015   Atrial fibrillation, chronic ([place])    Cellulitis of left lower extremity 08/17/2016   Ulcer associated with severe venous insufficiency   Chronic anticoagulation    Chronic diastolic CHF (congestive heart failure) ([place])    Chronic kidney disease    "RIGHT MANY KIDNEY INFECTIONS AND STONES"   COPD (chronic obstructive pulmonary disease) ([place])    Coronary artery disease    Dizziness    H/O: rheumatic fever    Heart murmur    Hypertension    PONV (postoperative nausea and vomiting)    ' SOMETIMES', BUT NOT ALWAYS"   S/P Maze operation for atrial fibrillation 10/14/2016   Complete bilateral atrial lesion set using cryothermy and bipolar radiofrequency ablation - atrial appendage was not treated due to previous surgical procedure (open mitral commissurotomy)   S/P mitral valve replacement with bioprosthetic valve 10/14/2016   29 mm Medtronic Mosaic porcine bioprosthetic tissue valve   Tricuspid regurgitation 07/14/2021   UTI (urinary tract infection)    Valvular heart disease    Has mitral stenosis with prior mitral commissurotomy in 1970    Past Surgical History:   Procedure Laterality Date   ABDOMINAL HYSTERECTOMY  1983   endometriosis   APPENDECTOMY     BACK SURGERY     neurosurgery x2   CARDIAC CATHETERIZATION     CARDIAC CATHETERIZATION N/A 08/03/2016   Procedure: Right/Left Heart Cath and Coronary Angiography;  Surgeon: Peter M Martinique, MD;  Location: [facility];  Service: Cardiovascular;  Laterality: N/A;   cataract surg     CHOLECYSTECTOMY N/A 05/30/2015   Procedure: LAPAROSCOPIC CHOLECYSTECTOMY WITH INTRAOPERATIVE CHOLANGIOGRAM;  Surgeon: Excell Seltzer, MD;  Location: [facility];  Service: General;  Laterality: N/A;   COLONOSCOPY     CORONARY ARTERY BYPASS GRAFT N/A 10/14/2016   Procedure: CORONARY ARTERY BYPASS GRAFTING (CABG);  Surgeon: Rexene Alberts, MD;  Location: [facility];  Service: Open Heart Surgery;  Laterality: N/A;   EYE SURGERY     MAZE N/A 10/14/2016   Procedure: MAZE;  Surgeon: Rexene Alberts, MD;  Location: [facility];  Service: Open Heart Surgery;  Laterality: N/A;   MITRAL VALVE REPLACEMENT N/A 10/14/2016   Procedure: REDO MITRAL VALVE REPLACEMENT (MVR);  Surgeon: Rexene Alberts, MD;  Location: [facility];  Service: Open Heart Surgery;  Laterality: N/A;   MITRAL VALVE SURGERY Left 1970   Open mitral commissurotomy via left thoracotomy approach   TEE WITHOUT CARDIOVERSION N/A 07/05/2016   Procedure: TRANSESOPHAGEAL ECHOCARDIOGRAM (TEE);  Surgeon: [name],  MD;  Location: [facility];  Service: Cardiovascular;  Laterality: N/A;   TEE WITHOUT CARDIOVERSION N/A 10/14/2016   Procedure: TRANSESOPHAGEAL ECHOCARDIOGRAM (TEE);  Surgeon: Rexene Alberts, MD;  Location: [place];  Service: Open Heart Surgery;  Laterality: N/A;    There were no vitals filed for this visit.   Subjective Assessment - 08/18/21 1303     Subjective COVID-19 screen performed prior to patient entering clinic. Has been resting more instead of busy.    Pertinent History Back surgery x 2, CABG, Mitral valave replacement, COPD, HTN.    How long can you walk  comfortably? Varies.  Pain hits suddenly without warning.    Diagnostic tests MRI scheduled.    Patient Stated Goals Get out of pain.    Currently in Pain? Yes    Pain Score 4     Pain Location Buttocks    Pain Orientation Left    Pain Descriptors / Indicators Discomfort    Pain Type Acute pain    Pain Onset More than a month ago    Pain Frequency Intermittent                [facility] PT Assessment - 08/18/21 0001       Assessment   Medical Diagnosis Lumbar radiculopathy    Referring Provider (PT) Fenton Malling NP      Precautions   Precautions None      Restrictions   Weight Bearing Restrictions No                           [facility] Adult PT Treatment/Exercise - 08/18/21 0001       Modalities   Modalities Electrical Stimulation;Moist Heat;Ultrasound      Moist Heat Therapy   Number Minutes Moist Heat 15 Minutes    Moist Heat Location Lumbar Spine      Electrical Stimulation   Electrical Stimulation Location Left low back.    Electrical Stimulation Action IFC    Electrical Stimulation Parameters 80-150 hz x15 min    Electrical Stimulation Goals Pain;Tone      Ultrasound   Ultrasound Location L buttock    Ultrasound Parameters Combo 1.5 w/cm2, 100%, 1 mhz x10 min    Ultrasound Goals Pain      Manual Therapy   Manual Therapy Soft tissue mobilization    Soft tissue mobilization STW to L glute, piriformis, lumbar paraspinals to reduce pain                          PT Long Term Goals - 08/11/21 1347       PT LONG TERM GOAL #1   Title Independent with a HEP.    Time 6    Period Weeks    Status New      PT LONG TERM GOAL #2   Title Perform ADL's with pain not > 3/10.    Time 6    Period Weeks    Status New      PT LONG TERM GOAL #3   Title Walk a community distance without experiencing left buttock pain and pain not > 3/10.    Time 6    Period Weeks    Status New                   Plan - 08/18/21 1343      Clinical Impression Statement Patient presented in clinic with reports of  L hip and low back pain. Patient states that she is ultimately limited with all her usual activities as she is normally very active within the community. Patient experiencing most pain within the L buttock/hip region. No reports of increased pain or tenderness with manual therapy. Normal modalities response noted following removal of the modalities.    Personal Factors and Comorbidities Comorbidity 1;Other    Comorbidities Back surgery x 2, CABG, Mitral valave replacement, COPD, HTN.    Examination-Activity Limitations Other;Locomotion Level    Examination-Participation Restrictions Other    Stability/Clinical Decision Making Evolving/Moderate complexity    Rehab Potential Good    PT Frequency 2x / week    PT Duration 6 weeks    PT Treatment/Interventions ADLs/Self Care Home Management;Cryotherapy;Electrical Stimulation;Ultrasound;Moist Heat;Therapeutic activities;Therapeutic exercise;Manual techniques;Patient/family education;Passive range of motion    PT Next Visit Plan Conservative treatments and STW/M as needed to decrease pain with slow progression into low-level core exercies.    Consulted and Agree with Plan of Care Patient             Patient will benefit from skilled therapeutic intervention in order to improve the following deficits and impairments:  Pain, Decreased activity tolerance, Postural dysfunction  Visit Diagnosis: Acute left-sided low back pain with left-sided sciatica     Problem List Patient Active Problem List   Diagnosis Date Noted   Tricuspid regurgitation 07/14/2021   Protein-calorie malnutrition, severe 09/05/2019   Dehydration    Gastroenteritis    Enteritis 09/03/2019   COVID-19 08/27/2019   Dysuria 07/07/2019   Chronic anxiety 12/23/2017   Aortic stenosis, mild 07/26/2017   Dyslipidemia 12/20/2016   S/P mitral valve replacement with bioprosthetic valve + CABG x1 + maze  procedure 10/14/2016   S/P Maze operation for atrial fibrillation 10/14/2016   S/P CABG x 1 10/14/2016   Cellulitis of left lower extremity 08/17/2016   Acute on chronic diastolic heart failure (HCC)    Coronary artery disease    Chronic insomnia 07/12/2016   DOE (dyspnea on exertion) 03/26/2015   Warfarin-induced coagulopathy ([place]) 03/16/2015   Valvular heart disease 03/16/2015   COPD GOLD II  03/16/2015   Nephrolithiasis 03/16/2015   Essential hypertension    RHEUMATIC MITRAL STENOSIS 01/05/2008   Rheumatic heart disease 01/05/2008   Atrial fibrillation ([place]) 01/05/2008    Standley Brooking, PTA 08/18/2021, 2:58 PM  [facility] [address] [place], [place], [address] Phone: [phone]   Fax:  [phone]  Name: KALINA MORABITO MRN: [id number] Date of Birth: 1934-11-30

## 2021-08-19 ENCOUNTER — Telehealth (HOSPITAL_BASED_OUTPATIENT_CLINIC_OR_DEPARTMENT_OTHER): Payer: Self-pay | Admitting: *Deleted

## 2021-08-19 NOTE — Telephone Encounter (Signed)
Patient with diagnosis of valvular afib on warfarin for anticoagulation.    Procedure: LESI-L4-L5-left-Interiaminar Date of procedure: TBD  CHA2DS2-VASc Score = 6  This indicates a 9.7% annual risk of stroke. The patient's score is based upon: CHF History: 1 HTN History: 1 Diabetes History: 0 Stroke History: 0 Vascular Disease History: 1 Age Score: 2 Gender Score: 1   CrCl 82mL/min using adjusted body weight Platelet count 173K  Per office protocol, patient can hold warfarin for 5 days prior to procedure. Patient [name] not need bridging with Lovenox (enoxaparin) around procedure. Please forward to PCP who manages pt's warfarin as an FYI.

## 2021-08-19 NOTE — Telephone Encounter (Signed)
Pharmacy, can you please comment on how long Coumadin can be held for upcoming procedure?  Thank you! 

## 2021-08-19 NOTE — Telephone Encounter (Signed)
   [facility] Pre-operative Risk Assessment    Patient Name: Catherine [name]  DOB: 1934/12/06 MRN: [id number]  HEARTCARE STAFF:  - IMPORTANT!!!!!! Under Visit Info/Reason for Call, type in Other and utilize the format Clearance MM/DD/YY or Clearance TBD. Do not use dashes or single digits. - Please review there is not already an duplicate clearance open for this procedure. - If request is for dental extraction, please clarify the # of teeth to be extracted. - If the patient is currently at the dentist's office, call Pre-Op Callback Staff (MA/nurse) to input urgent request.  - If the patient is not currently in the dentist office, please route to the Pre-Op pool.  Request for surgical clearance:  What type of surgery is being performed? LESI-L4-L5-left-Interiaminar  When is this surgery scheduled? TBD  What type of clearance is required (medical clearance vs. Pharmacy clearance to hold med vs. Both)? Both  Are there any medications that need to be held prior to surgery and how long? Coumadin for 5 days prior  Practice name and name of physician performing surgery? [facility] Lenord Carbo  What is the office phone number? [phone]   7.   What is the office fax number?        [phone]  8.   Anesthesia type (None, local, MAC, general) ? Georgiann Hahn 08/19/2021, 2:47 PM  _________________________________________________________________   (provider comments below)

## 2021-08-24 NOTE — Telephone Encounter (Signed)
   Name: Catherine [name]  DOB: 1935-04-30  MRN: [id number]   Primary Cardiologist: [name], MD  Chart reviewed as part of pre-operative protocol coverage. Patient was contacted 08/24/2021 in reference to pre-operative risk assessment for pending surgery as outlined below.  Catherine [name] was last seen on 07/14/21 by Dr. Oval Linsey.  Since that day, Catherine [name] has done well. She is primarily limited by back pain. Prior to this, she was able to complete 4.0 METS without angina. She has TR and baseline SOB.   Per our clinical pharmacist: Per office protocol, patient can hold warfarin for 5 days prior to procedure. Patient Catherine [name] not need bridging with Lovenox (enoxaparin) around procedure. Please forward to PCP who manages pt's warfarin as an FYI.  Pt last took coumadin 08/23/21. No procedure date listed on the request form that we received in the afternoon on 08/19/21. She states her injection date is Thursday, which would not allow a full five days off of coumadin. Please reach out to her to reschedule and discuss timing of coumadin hold.  She also inquires about the timing of MRI if injections ar rescheduled.  Therefore, based on ACC/AHA guidelines, the patient would be at acceptable risk for the planned procedure without further cardiovascular testing.   The patient was advised that if she develops new symptoms prior to surgery to contact our office to arrange for a follow-up visit, and she verbalized understanding.  I will route this recommendation to the requesting party via Epic fax function and remove from pre-op pool. Please call with questions.  Ledora Bottcher, PA 08/24/2021, 9:31 AM

## 2021-08-25 ENCOUNTER — Ambulatory Visit: Payer: Medicare Other | Admitting: Physical Therapy

## 2021-08-25 ENCOUNTER — Encounter: Payer: Self-pay | Admitting: Physical Therapy

## 2021-08-25 ENCOUNTER — Other Ambulatory Visit: Payer: Self-pay

## 2021-08-25 DIAGNOSIS — M5442 Lumbago with sciatica, left side: Secondary | ICD-10-CM

## 2021-08-25 NOTE — Therapy (Signed)
[facility] [place], [place], [address] Phone: [phone]   Fax:  [phone]  Physical Therapy Treatment  Patient Details  Name: Catherine [name] MRN: [id number] Date of Birth: 12-31-1934 Referring Provider (PT): Fenton Malling NP   Encounter Date: 08/25/2021   PT End of Session - 08/25/21 1304     Visit Number 3    Number of Visits 12    Date for PT Re-Evaluation 09/22/21    PT Start Time 8299    PT Stop Time 3716    PT Time Calculation (min) 44 min    Activity Tolerance Patient tolerated treatment well    Behavior During Therapy [facility] for tasks assessed/performed             Past Medical History:  Diagnosis Date   Acute on chronic diastolic heart failure ([place])    Aortic stenosis, mild 07/26/2017   Arthritis    Asthma    last attack 02/2015   Atrial fibrillation, chronic ([place])    Cellulitis of left lower extremity 08/17/2016   Ulcer associated with severe venous insufficiency   Chronic anticoagulation    Chronic diastolic CHF (congestive heart failure) ([place])    Chronic kidney disease    "RIGHT MANY KIDNEY INFECTIONS AND STONES"   COPD (chronic obstructive pulmonary disease) ([place])    Coronary artery disease    Dizziness    H/O: rheumatic fever    Heart murmur    Hypertension    PONV (postoperative nausea and vomiting)    ' SOMETIMES', BUT NOT ALWAYS"   S/P Maze operation for atrial fibrillation 10/14/2016   Complete bilateral atrial lesion set using cryothermy and bipolar radiofrequency ablation - atrial appendage was not treated due to previous surgical procedure (open mitral commissurotomy)   S/P mitral valve replacement with bioprosthetic valve 10/14/2016   29 mm Medtronic Mosaic porcine bioprosthetic tissue valve   Tricuspid regurgitation 07/14/2021   UTI (urinary tract infection)    Valvular heart disease    Has mitral stenosis with prior mitral commissurotomy in 1970    Past Surgical History:   Procedure Laterality Date   ABDOMINAL HYSTERECTOMY  1983   endometriosis   APPENDECTOMY     BACK SURGERY     neurosurgery x2   CARDIAC CATHETERIZATION     CARDIAC CATHETERIZATION N/A 08/03/2016   Procedure: Right/Left Heart Cath and Coronary Angiography;  Surgeon: Peter M Martinique, MD;  Location: [place] [facility];  Service: Cardiovascular;  Laterality: N/A;   cataract surg     CHOLECYSTECTOMY N/A 05/30/2015   Procedure: LAPAROSCOPIC CHOLECYSTECTOMY WITH INTRAOPERATIVE CHOLANGIOGRAM;  Surgeon: Excell Seltzer, MD;  Location: [place];  Service: General;  Laterality: N/A;   COLONOSCOPY     CORONARY ARTERY BYPASS GRAFT N/A 10/14/2016   Procedure: CORONARY ARTERY BYPASS GRAFTING (CABG);  Surgeon: Rexene Alberts, MD;  Location: [place];  Service: Open Heart Surgery;  Laterality: N/A;   EYE SURGERY     MAZE N/A 10/14/2016   Procedure: MAZE;  Surgeon: Rexene Alberts, MD;  Location: [place];  Service: Open Heart Surgery;  Laterality: N/A;   MITRAL VALVE REPLACEMENT N/A 10/14/2016   Procedure: REDO MITRAL VALVE REPLACEMENT (MVR);  Surgeon: Rexene Alberts, MD;  Location: [place];  Service: Open Heart Surgery;  Laterality: N/A;   MITRAL VALVE SURGERY Left 1970   Open mitral commissurotomy via left thoracotomy approach   TEE WITHOUT CARDIOVERSION N/A 07/05/2016   Procedure: TRANSESOPHAGEAL ECHOCARDIOGRAM (TEE);  Surgeon: [name],  MD;  Location: [place];  Service: Cardiovascular;  Laterality: N/A;   TEE WITHOUT CARDIOVERSION N/A 10/14/2016   Procedure: TRANSESOPHAGEAL ECHOCARDIOGRAM (TEE);  Surgeon: Rexene Alberts, MD;  Location: [place];  Service: Open Heart Surgery;  Laterality: N/A;    There were no vitals filed for this visit.   Subjective Assessment - 08/25/21 1303     Subjective Has MRI on 08/27/2021 and was supposed to get an injection but miscommunication has had to reschedule that. Did not have the hard pain she has been experienced over the weekend.    Pertinent History Back surgery x  2, CABG, Mitral valave replacement, COPD, HTN.    How long can you walk comfortably? Varies.  Pain hits suddenly without warning.    Diagnostic tests MRI scheduled.    Patient Stated Goals Get out of pain.    Currently in Pain? --   No pain assessment provided               [facility] PT Assessment - 08/25/21 0001       Assessment   Medical Diagnosis Lumbar radiculopathy    Referring Provider (PT) Fenton Malling NP      Precautions   Precautions None      Restrictions   Weight Bearing Restrictions No                           [facility] Adult PT Treatment/Exercise - 08/25/21 0001       Modalities   Modalities Electrical Stimulation;Moist Heat;Ultrasound      Moist Heat Therapy   Number Minutes Moist Heat 15 Minutes    Moist Heat Location Lumbar Spine      Electrical Stimulation   Electrical Stimulation Location L buttock/ low back    Electrical Stimulation Action IFC    Electrical Stimulation Parameters 80-150 hz x15 min    Electrical Stimulation Goals Pain;Tone      Ultrasound   Ultrasound Location L buttock    Ultrasound Parameters Combo 1.5 w/cm2, 100%, 1 mhz x10 min    Ultrasound Goals Pain      Manual Therapy   Manual Therapy Soft tissue mobilization    Soft tissue mobilization STW to L glute, piriformis, lumbar paraspinals to reduce pain                          PT Long Term Goals - 08/11/21 1347       PT LONG TERM GOAL #1   Title Independent with a HEP.    Time 6    Period Weeks    Status New      PT LONG TERM GOAL #2   Title Perform ADL's with pain not > 3/10.    Time 6    Period Weeks    Status New      PT LONG TERM GOAL #3   Title Walk a community distance without experiencing left buttock pain and pain not > 3/10.    Time 6    Period Weeks    Status New                   Plan - 08/25/21 1428     Clinical Impression Statement Patient presented in clinic with reports of less LBP and LE radicular  pain with ADLs and activity. Patient presented in clinic with mod tone of the L buttock and piriformis and mild into lumbar paraspinals.  Patient to have MRI done 08/27/2021 and a spinal injection next week. Patient educated that primary goal once pain is under control is to promote activity and train core. Normal modalities response noted following removal of the modalities.    Personal Factors and Comorbidities Comorbidity 1;Other    Comorbidities Back surgery x 2, CABG, Mitral valave replacement, COPD, HTN.    Examination-Activity Limitations Other;Locomotion Level    Examination-Participation Restrictions Other    Stability/Clinical Decision Making Evolving/Moderate complexity    Rehab Potential Good    PT Frequency 2x / week    PT Duration 6 weeks    PT Treatment/Interventions ADLs/Self Care Home Management;Cryotherapy;Electrical Stimulation;Ultrasound;Moist Heat;Therapeutic activities;Therapeutic exercise;Manual techniques;Patient/family education;Passive range of motion    PT Next Visit Plan Conservative treatments and STW/M as needed to decrease pain with slow progression into low-level core exercies.    Consulted and Agree with Plan of Care Patient             Patient will benefit from skilled therapeutic intervention in order to improve the following deficits and impairments:  Pain, Decreased activity tolerance, Postural dysfunction  Visit Diagnosis: Acute left-sided low back pain with left-sided sciatica     Problem List Patient Active Problem List   Diagnosis Date Noted   Tricuspid regurgitation 07/14/2021   Protein-calorie malnutrition, severe 09/05/2019   Dehydration    Gastroenteritis    Enteritis 09/03/2019   COVID-19 08/27/2019   Dysuria 07/07/2019   Chronic anxiety 12/23/2017   Aortic stenosis, mild 07/26/2017   Dyslipidemia 12/20/2016   S/P mitral valve replacement with bioprosthetic valve + CABG x1 + maze procedure 10/14/2016   S/P Maze operation for atrial  fibrillation 10/14/2016   S/P CABG x 1 10/14/2016   Cellulitis of left lower extremity 08/17/2016   Acute on chronic diastolic heart failure ([facility])    Coronary artery disease    Chronic insomnia 07/12/2016   DOE (dyspnea on exertion) 03/26/2015   Warfarin-induced coagulopathy ([place]) 03/16/2015   Valvular heart disease 03/16/2015   COPD GOLD II  03/16/2015   Nephrolithiasis 03/16/2015   Essential hypertension    RHEUMATIC MITRAL STENOSIS 01/05/2008   Rheumatic heart disease 01/05/2008   Atrial fibrillation ([place]) 01/05/2008    Standley Brooking, PTA 08/25/2021, 3:46 PM  [facility] [address] [place], [place], [address] Phone: [phone]   Fax:  [phone]  Name: Catherine [name] MRN: [id number] Date of Birth: [birth year]

## 2021-08-27 DIAGNOSIS — M5416 Radiculopathy, lumbar region: Secondary | ICD-10-CM | POA: Diagnosis not present

## 2021-08-27 DIAGNOSIS — M4327 Fusion of spine, lumbosacral region: Secondary | ICD-10-CM | POA: Diagnosis not present

## 2021-08-27 DIAGNOSIS — M545 Low back pain, unspecified: Secondary | ICD-10-CM | POA: Diagnosis not present

## 2021-08-31 ENCOUNTER — Telehealth (HOSPITAL_BASED_OUTPATIENT_CLINIC_OR_DEPARTMENT_OTHER): Payer: Self-pay | Admitting: Cardiovascular Disease

## 2021-08-31 NOTE — Telephone Encounter (Signed)
Spoke with patient of Dr. Oval Linsey  She wanted to talk to [facility] about her warfarin  She is supposed to have a back injection this week  She reports previously, she has taken lovenox injections when she had to stop anticoagulation   Reviewed 11/23 clearance note with patient and read verbatim to her what PA and pharmacy team advised.  No lovenox bridging, OK to hold warfarin 5 days  Advised she call PCP office, who follows warfarin, to r/s next INR check if needed

## 2021-08-31 NOTE — Telephone Encounter (Signed)
Patient is asking that the nurse give her a call back.

## 2021-09-01 ENCOUNTER — Other Ambulatory Visit: Payer: Self-pay

## 2021-09-01 ENCOUNTER — Ambulatory Visit: Payer: Medicare Other | Attending: Neurosurgery | Admitting: Physical Therapy

## 2021-09-01 ENCOUNTER — Encounter: Payer: Self-pay | Admitting: Physical Therapy

## 2021-09-01 DIAGNOSIS — M5442 Lumbago with sciatica, left side: Secondary | ICD-10-CM | POA: Insufficient documentation

## 2021-09-01 NOTE — Therapy (Signed)
[facility] [place], [place], [address] Phone: [phone]   Fax:  [phone]  Physical Therapy Treatment  Patient Details  Name: Catherine [name] MRN: [id number] Date of Birth: Jul 09, 1935 Referring Provider (PT): Fenton Malling NP   Encounter Date: 09/01/2021   PT End of Session - 09/01/21 4128     Visit Number 4    Number of Visits 12    Date for PT Re-Evaluation 09/22/21    PT Start Time 7867    PT Stop Time 6720    PT Time Calculation (min) 43 min    Activity Tolerance Patient tolerated treatment well    Behavior During Therapy [facility] for tasks assessed/performed             Past Medical History:  Diagnosis Date   Acute on chronic diastolic heart failure ([place])    Aortic stenosis, mild 07/26/2017   Arthritis    Asthma    last attack 02/2015   Atrial fibrillation, chronic ([place])    Cellulitis of left lower extremity 08/17/2016   Ulcer associated with severe venous insufficiency   Chronic anticoagulation    Chronic diastolic CHF (congestive heart failure) ([place])    Chronic kidney disease    "RIGHT MANY KIDNEY INFECTIONS AND STONES"   COPD (chronic obstructive pulmonary disease) ([place])    Coronary artery disease    Dizziness    H/O: rheumatic fever    Heart murmur    Hypertension    PONV (postoperative nausea and vomiting)    ' SOMETIMES', BUT NOT ALWAYS"   S/P Maze operation for atrial fibrillation 10/14/2016   Complete bilateral atrial lesion set using cryothermy and bipolar radiofrequency ablation - atrial appendage was not treated due to previous surgical procedure (open mitral commissurotomy)   S/P mitral valve replacement with bioprosthetic valve 10/14/2016   29 mm Medtronic Mosaic porcine bioprosthetic tissue valve   Tricuspid regurgitation 07/14/2021   UTI (urinary tract infection)    Valvular heart disease    Has mitral stenosis with prior mitral commissurotomy in 1970    Past Surgical History:   Procedure Laterality Date   ABDOMINAL HYSTERECTOMY  1983   endometriosis   APPENDECTOMY     BACK SURGERY     neurosurgery x2   CARDIAC CATHETERIZATION     CARDIAC CATHETERIZATION N/A 08/03/2016   Procedure: Right/Left Heart Cath and Coronary Angiography;  Surgeon: Peter M Martinique, MD;  Location: [place] [facility];  Service: Cardiovascular;  Laterality: N/A;   cataract surg     CHOLECYSTECTOMY N/A 05/30/2015   Procedure: LAPAROSCOPIC CHOLECYSTECTOMY WITH INTRAOPERATIVE CHOLANGIOGRAM;  Surgeon: Excell Seltzer, MD;  Location: [place];  Service: General;  Laterality: N/A;   COLONOSCOPY     CORONARY ARTERY BYPASS GRAFT N/A 10/14/2016   Procedure: CORONARY ARTERY BYPASS GRAFTING (CABG);  Surgeon: Rexene Alberts, MD;  Location: [place];  Service: Open Heart Surgery;  Laterality: N/A;   EYE SURGERY     MAZE N/A 10/14/2016   Procedure: MAZE;  Surgeon: Rexene Alberts, MD;  Location: [place];  Service: Open Heart Surgery;  Laterality: N/A;   MITRAL VALVE REPLACEMENT N/A 10/14/2016   Procedure: REDO MITRAL VALVE REPLACEMENT (MVR);  Surgeon: Rexene Alberts, MD;  Location: [place];  Service: Open Heart Surgery;  Laterality: N/A;   MITRAL VALVE SURGERY Left 1970   Open mitral commissurotomy via left thoracotomy approach   TEE WITHOUT CARDIOVERSION N/A 07/05/2016   Procedure: TRANSESOPHAGEAL ECHOCARDIOGRAM (TEE);  Surgeon: [name],  MD;  Location: [place];  Service: Cardiovascular;  Laterality: N/A;   TEE WITHOUT CARDIOVERSION N/A 10/14/2016   Procedure: TRANSESOPHAGEAL ECHOCARDIOGRAM (TEE);  Surgeon: Rexene Alberts, MD;  Location: [place];  Service: Open Heart Surgery;  Laterality: N/A;    There were no vitals filed for this visit.   Subjective Assessment - 09/01/21 1302     Subjective Having more pain in LLE pain. Patient reports that she had more pain after last visit after going to the grocery store.    Pertinent History Back surgery x 2, CABG, Mitral valave replacement, COPD, HTN.    How  long can you walk comfortably? Varies.  Pain hits suddenly without warning.    Diagnostic tests MRI scheduled.    Patient Stated Goals Get out of pain.    Currently in Pain? Yes    Pain Score 8     Pain Location Buttocks    Pain Orientation Left    Pain Descriptors / Indicators Discomfort    Pain Type Acute pain    Pain Onset More than a month ago    Pain Frequency Constant                [facility] PT Assessment - 09/01/21 0001       Assessment   Medical Diagnosis Lumbar radiculopathy    Referring Provider (PT) Fenton Malling NP      Precautions   Precautions None      Restrictions   Weight Bearing Restrictions No                           [facility] Adult PT Treatment/Exercise - 09/01/21 0001       Modalities   Modalities Electrical Stimulation;Moist Heat;Ultrasound      Moist Heat Therapy   Number Minutes Moist Heat 15 Minutes    Moist Heat Location Lumbar Spine      Electrical Stimulation   Electrical Stimulation Location L buttock/ low back    Electrical Stimulation Action IFC    Electrical Stimulation Parameters 80-150 hz x15 min    Electrical Stimulation Goals Pain;Tone      Ultrasound   Ultrasound Location L buttock    Ultrasound Parameters Combo 1.5 w/cm2, 100%, 1 mhz x10 min    Ultrasound Goals Pain      Manual Therapy   Manual Therapy Soft tissue mobilization    Soft tissue mobilization STW to L glute, piriformis, lumbar paraspinals to reduce pain                          PT Long Term Goals - 08/11/21 1347       PT LONG TERM GOAL #1   Title Independent with a HEP.    Time 6    Period Weeks    Status New      PT LONG TERM GOAL #2   Title Perform ADL's with pain not > 3/10.    Time 6    Period Weeks    Status New      PT LONG TERM GOAL #3   Title Walk a community distance without experiencing left buttock pain and pain not > 3/10.    Time 6    Period Weeks    Status New                   Plan  - 09/01/21 1354     Clinical Impression  Statement Patient presented in clinic with reports of more radicular LLE pain and buttock pain. Patient indicates that sometimes she sits in the floor at home with her back resting against the couch which helps alleviate some pain. Moderate tone palpable of the L buttock and piriformis region. No reports of soreness to L SI joint or lumbar region. Patient to have injection on 09/03/2021 and has not heard her results from the MRI taken last week. Normal modalities response noted following removal of the modalities.    Personal Factors and Comorbidities Comorbidity 1;Other    Comorbidities Back surgery x 2, CABG, Mitral valave replacement, COPD, HTN.    Examination-Activity Limitations Other;Locomotion Level    Examination-Participation Restrictions Other    Stability/Clinical Decision Making Evolving/Moderate complexity    Rehab Potential Good    PT Frequency 2x / week    PT Duration 6 weeks    PT Treatment/Interventions ADLs/Self Care Home Management;Cryotherapy;Electrical Stimulation;Ultrasound;Moist Heat;Therapeutic activities;Therapeutic exercise;Manual techniques;Patient/family education;Passive range of motion    PT Next Visit Plan Conservative treatments and STW/M as needed to decrease pain with slow progression into low-level core exercies.    Consulted and Agree with Plan of Care Patient             Patient will benefit from skilled therapeutic intervention in order to improve the following deficits and impairments:  Pain, Decreased activity tolerance, Postural dysfunction  Visit Diagnosis: Acute left-sided low back pain with left-sided sciatica     Problem List Patient Active Problem List   Diagnosis Date Noted   Tricuspid regurgitation 07/14/2021   Protein-calorie malnutrition, severe 09/05/2019   Dehydration    Gastroenteritis    Enteritis 09/03/2019   COVID-19 08/27/2019   Dysuria 07/07/2019   Chronic anxiety 12/23/2017    Aortic stenosis, mild 07/26/2017   Dyslipidemia 12/20/2016   S/P mitral valve replacement with bioprosthetic valve + CABG x1 + maze procedure 10/14/2016   S/P Maze operation for atrial fibrillation 10/14/2016   S/P CABG x 1 10/14/2016   Cellulitis of left lower extremity 08/17/2016   Acute on chronic diastolic heart failure ([facility])    Coronary artery disease    Chronic insomnia 07/12/2016   DOE (dyspnea on exertion) 03/26/2015   Warfarin-induced coagulopathy ([place]) 03/16/2015   Valvular heart disease 03/16/2015   COPD GOLD II  03/16/2015   Nephrolithiasis 03/16/2015   Essential hypertension    RHEUMATIC MITRAL STENOSIS 01/05/2008   Rheumatic heart disease 01/05/2008   Atrial fibrillation ([place]) 01/05/2008    Standley Brooking, PTA 09/01/2021, 1:57 PM  [facility] [address] [place], [place], [address] Phone: [phone]   Fax:  [phone]  Name: Catherine [name] MRN: [id number] Date of Birth: 24-Jul-1935

## 2021-09-02 NOTE — Telephone Encounter (Signed)
Pt reports she is having a back injection tomorrow and was advised by cardiology to hold her warfarin for 5 days. This coumadin nurse was not contacted for dosing instructions. She is requesting a sooner apt for her INR check. Advised she will also need to make adjustments to her dosing to get her INR in range sooner. Pt requested to be seen 12/12 due to another apt her husband has. Scheduled pt for 12/12 and advised Friday and Sunday take 1 1/2 tablets and on Saturday take 1 tablet and then recheck on Mon. Advised if any concerns to contact this nurse. Pt given phone number to coumadin clinic. Pt verbalized understanding.

## 2021-09-03 DIAGNOSIS — M5416 Radiculopathy, lumbar region: Secondary | ICD-10-CM | POA: Diagnosis not present

## 2021-09-07 ENCOUNTER — Ambulatory Visit (INDEPENDENT_AMBULATORY_CARE_PROVIDER_SITE_OTHER): Payer: Medicare Other

## 2021-09-07 DIAGNOSIS — D6832 Hemorrhagic disorder due to extrinsic circulating anticoagulants: Secondary | ICD-10-CM | POA: Diagnosis not present

## 2021-09-07 DIAGNOSIS — T45515A Adverse effect of anticoagulants, initial encounter: Secondary | ICD-10-CM

## 2021-09-07 LAB — POCT INR: INR: 2 (ref 2.0–3.0)

## 2021-09-07 NOTE — Patient Instructions (Addendum)
Pre visit review using our clinic review tool, if applicable. No additional management support is needed unless otherwise documented below in the visit note.  Continue to take 1 tablet daily except 1/2 tablet on Wednesday and Saturday.  Re-check in 6 weeks.

## 2021-09-07 NOTE — Progress Notes (Signed)
Continue to take 1 tablet daily except 1/2 tablet on Wednesday and Saturday.  Re-check in 6 weeks.

## 2021-09-08 ENCOUNTER — Encounter: Payer: Self-pay | Admitting: Physical Therapy

## 2021-09-08 ENCOUNTER — Ambulatory Visit: Payer: Medicare Other | Admitting: Physical Therapy

## 2021-09-08 ENCOUNTER — Other Ambulatory Visit: Payer: Self-pay

## 2021-09-08 DIAGNOSIS — M5442 Lumbago with sciatica, left side: Secondary | ICD-10-CM

## 2021-09-08 NOTE — Therapy (Signed)
[facility] [place], [place], [address] Phone: [phone]   Fax:  [phone]  Physical Therapy Treatment  Patient Details  Name: Catherine [name] MRN: [id number] Date of Birth: [birth year] Referring Provider (PT): Fenton Malling NP   Encounter Date: 09/08/2021   PT End of Session - 09/08/21 1304     Visit Number 5    Number of Visits 12    Date for PT Re-Evaluation 09/22/21    PT Start Time 1304    PT Stop Time 5732    PT Time Calculation (min) 43 min    Activity Tolerance Patient tolerated treatment well    Behavior During Therapy [facility] for tasks assessed/performed             Past Medical History:  Diagnosis Date   Acute on chronic diastolic heart failure ([place])    Aortic stenosis, mild 07/26/2017   Arthritis    Asthma    last attack 02/2015   Atrial fibrillation, chronic ([place])    Cellulitis of left lower extremity 08/17/2016   Ulcer associated with severe venous insufficiency   Chronic anticoagulation    Chronic diastolic CHF (congestive heart failure) ([place])    Chronic kidney disease    "RIGHT MANY KIDNEY INFECTIONS AND STONES"   COPD (chronic obstructive pulmonary disease) ([place])    Coronary artery disease    Dizziness    H/O: rheumatic fever    Heart murmur    Hypertension    PONV (postoperative nausea and vomiting)    ' SOMETIMES', BUT NOT ALWAYS"   S/P Maze operation for atrial fibrillation 10/14/2016   Complete bilateral atrial lesion set using cryothermy and bipolar radiofrequency ablation - atrial appendage was not treated due to previous surgical procedure (open mitral commissurotomy)   S/P mitral valve replacement with bioprosthetic valve 10/14/2016   29 mm Medtronic Mosaic porcine bioprosthetic tissue valve   Tricuspid regurgitation 07/14/2021   UTI (urinary tract infection)    Valvular heart disease    Has mitral stenosis with prior mitral commissurotomy in 1970    Past Surgical History:   Procedure Laterality Date   ABDOMINAL HYSTERECTOMY  1983   endometriosis   APPENDECTOMY     BACK SURGERY     neurosurgery x2   CARDIAC CATHETERIZATION     CARDIAC CATHETERIZATION N/A 08/03/2016   Procedure: Right/Left Heart Cath and Coronary Angiography;  Surgeon: Peter M Martinique, MD;  Location: [place] [facility];  Service: Cardiovascular;  Laterality: N/A;   cataract surg     CHOLECYSTECTOMY N/A 05/30/2015   Procedure: LAPAROSCOPIC CHOLECYSTECTOMY WITH INTRAOPERATIVE CHOLANGIOGRAM;  Surgeon: Excell Seltzer, MD;  Location: [place];  Service: General;  Laterality: N/A;   COLONOSCOPY     CORONARY ARTERY BYPASS GRAFT N/A 10/14/2016   Procedure: CORONARY ARTERY BYPASS GRAFTING (CABG);  Surgeon: Rexene Alberts, MD;  Location: [place];  Service: Open Heart Surgery;  Laterality: N/A;   EYE SURGERY     MAZE N/A 10/14/2016   Procedure: MAZE;  Surgeon: Rexene Alberts, MD;  Location: [place];  Service: Open Heart Surgery;  Laterality: N/A;   MITRAL VALVE REPLACEMENT N/A 10/14/2016   Procedure: REDO MITRAL VALVE REPLACEMENT (MVR);  Surgeon: Rexene Alberts, MD;  Location: [place];  Service: Open Heart Surgery;  Laterality: N/A;   MITRAL VALVE SURGERY Left 1970   Open mitral commissurotomy via left thoracotomy approach   TEE WITHOUT CARDIOVERSION N/A 07/05/2016   Procedure: TRANSESOPHAGEAL ECHOCARDIOGRAM (TEE);  Surgeon: [name],  MD;  Location: [place];  Service: Cardiovascular;  Laterality: N/A;   TEE WITHOUT CARDIOVERSION N/A 10/14/2016   Procedure: TRANSESOPHAGEAL ECHOCARDIOGRAM (TEE);  Surgeon: Rexene Alberts, MD;  Location: [place];  Service: Open Heart Surgery;  Laterality: N/A;    There were no vitals filed for this visit.   Subjective Assessment - 09/08/21 1303     Subjective Had injection in her back last Thursday but has had continued pain. Reports that she was up a lot yesterday so she couldnt rest like she normally.    Pertinent History Back surgery x 2, CABG, Mitral valave  replacement, COPD, HTN.    How long can you walk comfortably? Varies.  Pain hits suddenly without warning.    Diagnostic tests MRI scheduled.    Patient Stated Goals Get out of pain.    Currently in Pain? Yes    Pain Score 8     Pain Location Buttocks    Pain Orientation Left    Pain Descriptors / Indicators Discomfort    Pain Type Acute pain    Pain Onset More than a month ago    Pain Frequency Constant                [facility] PT Assessment - 09/08/21 0001       Assessment   Medical Diagnosis Lumbar radiculopathy    Referring Provider (PT) Fenton Malling NP      Precautions   Precautions None                           [facility] Adult PT Treatment/Exercise - 09/08/21 0001       Modalities   Modalities Electrical Stimulation;Moist Heat;Ultrasound      Moist Heat Therapy   Number Minutes Moist Heat 15 Minutes    Moist Heat Location Lumbar Spine      Electrical Stimulation   Electrical Stimulation Location L buttock/ low back    Electrical Stimulation Action IFC    Electrical Stimulation Parameters 80-150 hz x15 min    Electrical Stimulation Goals Pain;Tone      Ultrasound   Ultrasound Location L buttock    Ultrasound Parameters Combo 1.5 w/cm2, 100%, 1 mhz x10 min    Ultrasound Goals Pain      Manual Therapy   Manual Therapy Soft tissue mobilization    Soft tissue mobilization STW to L glute, piriformis, lumbar paraspinals to reduce pain                          PT Long Term Goals - 08/11/21 1347       PT LONG TERM GOAL #1   Title Independent with a HEP.    Time 6    Period Weeks    Status New      PT LONG TERM GOAL #2   Title Perform ADL's with pain not > 3/10.    Time 6    Period Weeks    Status New      PT LONG TERM GOAL #3   Title Walk a community distance without experiencing left buttock pain and pain not > 3/10.    Time 6    Period Weeks    Status New                   Plan - 09/08/21 1400      Clinical Impression Statement Patient had injection in low back  09/03/2021 but has seen no improvement. Patient presented with continued LBP and sciatica related pain. Patient more tender in mid L buttock. Normal modalties response noted following removal of the modalities.    Personal Factors and Comorbidities Comorbidity 1;Other    Comorbidities Back surgery x 2, CABG, Mitral valave replacement, COPD, HTN.    Examination-Activity Limitations Other;Locomotion Level    Examination-Participation Restrictions Other    Stability/Clinical Decision Making Evolving/Moderate complexity    Rehab Potential Good    PT Frequency 2x / week    PT Duration 6 weeks    PT Treatment/Interventions ADLs/Self Care Home Management;Cryotherapy;Electrical Stimulation;Ultrasound;Moist Heat;Therapeutic activities;Therapeutic exercise;Manual techniques;Patient/family education;Passive range of motion    PT Next Visit Plan Conservative treatments and STW/M as needed to decrease pain with slow progression into low-level core exercies.    Consulted and Agree with Plan of Care Patient             Patient will benefit from skilled therapeutic intervention in order to improve the following deficits and impairments:  Pain, Decreased activity tolerance, Postural dysfunction  Visit Diagnosis: Acute left-sided low back pain with left-sided sciatica     Problem List Patient Active Problem List   Diagnosis Date Noted   Tricuspid regurgitation 07/14/2021   Protein-calorie malnutrition, severe 09/05/2019   Dehydration    Gastroenteritis    Enteritis 09/03/2019   COVID-19 08/27/2019   Dysuria 07/07/2019   Chronic anxiety 12/23/2017   Aortic stenosis, mild 07/26/2017   Dyslipidemia 12/20/2016   S/P mitral valve replacement with bioprosthetic valve + CABG x1 + maze procedure 10/14/2016   S/P Maze operation for atrial fibrillation 10/14/2016   S/P CABG x 1 10/14/2016   Cellulitis of left lower extremity 08/17/2016    Acute on chronic diastolic heart failure (H[facility])    Coronary artery disease    Chronic insomnia 07/12/2016   DOE (dyspnea on exertion) 03/26/2015   Warfarin-induced coagulopathy ([place]) 03/16/2015   Valvular heart disease 03/16/2015   COPD GOLD II  03/16/2015   Nephrolithiasis 03/16/2015   Essential hypertension    RHEUMATIC MITRAL STENOSIS 01/05/2008   Rheumatic heart disease 01/05/2008   Atrial fibrillation ([place]) 01/05/2008    Standley Brooking, PTA 09/08/2021, 2:46 PM  [facility] [address] [place], [place], [address] Phone: [phone]   Fax:  [phone]  Name: AALANI AIKENS MRN: [id number] Date of Birth: December 17, 1934

## 2021-09-14 ENCOUNTER — Telehealth: Payer: Self-pay | Admitting: Pharmacist

## 2021-09-14 NOTE — Chronic Care Management (AMB) (Signed)
Chronic Care Management Pharmacy Assistant   Name: Catherine [name]  MRN: [id number] DOB: 1934/12/29  Reason for Encounter: Disease State / Hypertension Assessment Call   Conditions to be addressed/monitored: HTN  Recent office visits:  07/14/2021 [name] MD - Patient was seen for Lumbar pain and additional issues. Started Triamcinolone 0.1% topical twice daily. No follow up noted.  Recent consult visits:  None  Hospital visits:  None  Medications: Outpatient Encounter Medications as of 09/14/2021  Medication Sig   acetaminophen (TYLENOL) 500 MG tablet Take 500 mg by mouth daily.   albuterol (VENTOLIN HFA) 108 (90 Base) MCG/ACT inhaler Inhale 2 puffs into the lungs every 4 (four) hours as needed for wheezing or shortness of breath.   cephALEXin (KEFLEX) 250 MG capsule TAKE ONE CAPSULE ONCE DAILY   cholecalciferol (VITAMIN D3) 25 MCG (1000 UNIT) tablet Take 1 tablet (1,000 Units total) by mouth daily.   diazepam (VALIUM) 5 MG tablet TAKE ONE TABLET AT BEDTIME   doxazosin (CARDURA) 8 MG tablet TAKE 1 TABLET DAILY   metoprolol succinate (TOPROL-XL) 25 MG 24 hr tablet TAKE 1 TABLET DAILY   Multiple Vitamins-Minerals (HAIR/SKIN/NAILS/BIOTIN PO) Take 1 tablet by mouth daily.   Multiple Vitamins-Minerals (MULTIVITAMIN ADULT EXTRA C) CHEW Chew 2 tablets by mouth daily.   potassium chloride (KLOR-CON) 10 MEQ tablet TAKE UP TO 8 TABLETS DAILY AS DIRECTED   rosuvastatin (CRESTOR) 5 MG tablet Take one tablet every Monday, wednesday Friday and Sunday   SYMBICORT 80-4.5 MCG/ACT inhaler 2 PUFFS 2 TIMES A DAY   torsemide (DEMADEX) 20 MG tablet Take 2 tablets (40mg  total) twice daily. You may take an extra tablet for a total dose of 3 tablets (60mg  total) twice daily as needed for weight gain or swelling.   triamcinolone cream (KENALOG) 0.1 % Apply 1 application topically 2 (two) times daily.   warfarin (COUMADIN) 2 MG tablet Take 1 tablet daily or take as directed by anticoagulation  clinic   No facility-administered encounter medications on file as of 09/14/2021.  Fill History: Symbicort 80 mcg-4.5 mcg/actuation HFA aerosol inhaler 08/27/2021 30   doxazosin 8 mg tablet 07/20/2021 90   metoprolol succinate ER 25 mg tablet,extended release 24 hr 07/07/2021 90   potassium chloride ER 10 mEq tablet,extended release 07/14/2021 90   rosuvastatin 5 mg tablet 08/28/2021 58   torsemide 20 mg tablet 08/19/2021 40   TRIAMTERENE-HCTZ 3 07/30/2019 90   warfarin 2 mg tablet 05/11/2021 90   diazepam 5 mg tablet 09/04/2021 30   Reviewed chart prior to disease state call. Spoke with patient regarding BP  Recent Office Vitals: BP Readings from Last 3 Encounters:  07/14/21 134/60  07/14/21 (!) 154/80  05/22/21 (!) 152/82   Pulse Readings from Last 3 Encounters:  07/14/21 (!) 107  07/14/21 (!) 58  05/22/21 60    Wt Readings from Last 3 Encounters:  07/14/21 157 lb (71.2 kg)  07/14/21 152 lb 6.4 oz (69.1 kg)  05/22/21 153 lb (69.4 kg)     Kidney Function Lab Results  Component Value Date/Time   CREATININE 0.86 05/07/2021 11:29 AM   CREATININE 0.92 11/27/2020 02:39 PM   CREATININE 0.94 (H) 07/29/2020 09:50 AM   CREATININE 0.71 07/09/2020 02:03 PM   GFR 67.66 10/01/2019 03:03 PM   GFRNONAA 59 (L) 10/29/2020 12:10 PM   GFRAA 67 10/29/2020 12:10 PM    BMP Latest Ref Rng & Units 05/07/2021 11/27/2020 10/29/2020  Glucose 65 - 99 mg/dL 96 89 120(H)  BUN  8 - 27 mg/dL 13 14 16   Creatinine 0.57 - 1.00 mg/dL 0.86 0.92 0.91  BUN/Creat Ratio 12 - 28 15 15 18   Sodium 134 - 144 mmol/L 147(H) 147(H) 144  Potassium 3.5 - 5.2 mmol/L 4.0 3.5 3.5  Chloride 96 - 106 mmol/L 102 104 101  CO2 20 - 29 mmol/L 29 23 26   Calcium 8.7 - 10.3 mg/dL 9.4 9.4 9.3    Current antihypertensive regimen:  Metoprolol succinate 25mg  - take 1 tablet daily.  Torsemide 20mg  - Take 2 tablets twice daily You may take an extra tablet for a total dose of 3 tablets (60mg  total) twice daily as needed  for weight gain or swelling. Doxazosin 8 mg daily  How often are you checking your Blood Pressure? infrequently  Current home BP readings: Patient states her blood pressures have been good, around 130/75  What recent interventions/DTPs have been made by any provider to improve Blood Pressure control since last CPP Visit: Patient denies any recent changes.  Any recent hospitalizations or ED visits since last visit with CPP? No recent hospitalizations.  What diet changes have been made to improve Blood Pressure Control?  Patient stated she eats cereal and a orange for breakfast. She has some crackers for lunch. She makes a lot of homemade soups this time of year for dinner. She does not eat a lot of meat at all or add salt to her food.   What exercise is being done to improve your Blood Pressure Control?  Patient has been having issues with her back again and has been unable to do silver sneakers class. She is able to do things around home but that's about it.  Adherence Review: Is the patient currently on ACE/ARB medication? No Does the patient have >5 day gap between last estimated fill dates? No   Care Gaps: AWV - completed on 04/07/21 Covid-19 vaccine booster 4 - overdue since 11/11/20 Flu vaccine - due Last BP - 134/60 on 07/14/2021  Star Rating Drugs: Rosuvastatin 5mg  - last filled on 08/28/2021 58DS at [facility].  [place] Pharmacist Assistant [phone]

## 2021-09-15 ENCOUNTER — Encounter: Payer: Self-pay | Admitting: Physical Therapy

## 2021-09-15 ENCOUNTER — Ambulatory Visit: Payer: Medicare Other | Admitting: Physical Therapy

## 2021-09-15 ENCOUNTER — Other Ambulatory Visit: Payer: Self-pay

## 2021-09-15 DIAGNOSIS — M5442 Lumbago with sciatica, left side: Secondary | ICD-10-CM

## 2021-09-15 NOTE — Therapy (Signed)
[facility] [place], [place], [address] Phone: [phone]   Fax:  [phone]  Physical Therapy Treatment  Patient Details  Name: Catherine [name] MRN: [id number] Date of Birth: 25-Apr-1935 Referring Provider (PT): Fenton Malling NP   Encounter Date: 09/15/2021   PT End of Session - 09/15/21 1304     Visit Number 6    Number of Visits 12    Date for PT Re-Evaluation 09/22/21    PT Start Time 8563    PT Stop Time 1497    PT Time Calculation (min) 43 min    Activity Tolerance Patient tolerated treatment well    Behavior During Therapy [facility] for tasks assessed/performed             Past Medical History:  Diagnosis Date   Acute on chronic diastolic heart failure ([place])    Aortic stenosis, mild 07/26/2017   Arthritis    Asthma    last attack 02/2015   Atrial fibrillation, chronic ([place])    Cellulitis of left lower extremity 08/17/2016   Ulcer associated with severe venous insufficiency   Chronic anticoagulation    Chronic diastolic CHF (congestive heart failure) ([place])    Chronic kidney disease    "RIGHT MANY KIDNEY INFECTIONS AND STONES"   COPD (chronic obstructive pulmonary disease) ([place])    Coronary artery disease    Dizziness    H/O: rheumatic fever    Heart murmur    Hypertension    PONV (postoperative nausea and vomiting)    ' SOMETIMES', BUT NOT ALWAYS"   S/P Maze operation for atrial fibrillation 10/14/2016   Complete bilateral atrial lesion set using cryothermy and bipolar radiofrequency ablation - atrial appendage was not treated due to previous surgical procedure (open mitral commissurotomy)   S/P mitral valve replacement with bioprosthetic valve 10/14/2016   29 mm Medtronic Mosaic porcine bioprosthetic tissue valve   Tricuspid regurgitation 07/14/2021   UTI (urinary tract infection)    Valvular heart disease    Has mitral stenosis with prior mitral commissurotomy in 1970    Past Surgical History:   Procedure Laterality Date   ABDOMINAL HYSTERECTOMY  1983   endometriosis   APPENDECTOMY     BACK SURGERY     neurosurgery x2   CARDIAC CATHETERIZATION     CARDIAC CATHETERIZATION N/A 08/03/2016   Procedure: Right/Left Heart Cath and Coronary Angiography;  Surgeon: Peter M Martinique, MD;  Location: [place] [facility];  Service: Cardiovascular;  Laterality: N/A;   cataract surg     CHOLECYSTECTOMY N/A 05/30/2015   Procedure: LAPAROSCOPIC CHOLECYSTECTOMY WITH INTRAOPERATIVE CHOLANGIOGRAM;  Surgeon: Excell Seltzer, MD;  Location: [place];  Service: General;  Laterality: N/A;   COLONOSCOPY     CORONARY ARTERY BYPASS GRAFT N/A 10/14/2016   Procedure: CORONARY ARTERY BYPASS GRAFTING (CABG);  Surgeon: Rexene Alberts, MD;  Location: [place];  Service: Open Heart Surgery;  Laterality: N/A;   EYE SURGERY     MAZE N/A 10/14/2016   Procedure: MAZE;  Surgeon: Rexene Alberts, MD;  Location: [place];  Service: Open Heart Surgery;  Laterality: N/A;   MITRAL VALVE REPLACEMENT N/A 10/14/2016   Procedure: REDO MITRAL VALVE REPLACEMENT (MVR);  Surgeon: Rexene Alberts, MD;  Location: [place];  Service: Open Heart Surgery;  Laterality: N/A;   MITRAL VALVE SURGERY Left 1970   Open mitral commissurotomy via left thoracotomy approach   TEE WITHOUT CARDIOVERSION N/A 07/05/2016   Procedure: TRANSESOPHAGEAL ECHOCARDIOGRAM (TEE);  Surgeon: [name],  MD;  Location: [place];  Service: Cardiovascular;  Laterality: N/A;   TEE WITHOUT CARDIOVERSION N/A 10/14/2016   Procedure: TRANSESOPHAGEAL ECHOCARDIOGRAM (TEE);  Surgeon: Rexene Alberts, MD;  Location: [place];  Service: Open Heart Surgery;  Laterality: N/A;    There were no vitals filed for this visit.   Subjective Assessment - 09/15/21 1302     Subjective Reports less sciatic related pain is less. Having cramps in her legs.    Pertinent History Back surgery x 2, CABG, Mitral valave replacement, COPD, HTN.    How long can you walk comfortably? Varies.  Pain hits  suddenly without warning.    Diagnostic tests MRI scheduled.    Patient Stated Goals Get out of pain.    Currently in Pain? Yes    Pain Score 2     Pain Location Buttocks    Pain Orientation Left    Pain Descriptors / Indicators Discomfort    Pain Type Acute pain    Pain Onset More than a month ago    Pain Frequency Constant                [facility] PT Assessment - 09/15/21 0001       Assessment   Medical Diagnosis Lumbar radiculopathy    Referring Provider (PT) Fenton Malling NP      Precautions   Precautions None                           [facility] Adult PT Treatment/Exercise - 09/15/21 0001       Exercises   Exercises Lumbar      Lumbar Exercises: Aerobic   Nustep L4 x10 min      Modalities   Modalities Electrical Stimulation;Moist Heat;Ultrasound      Moist Heat Therapy   Number Minutes Moist Heat 15 Minutes    Moist Heat Location Lumbar Spine      Electrical Stimulation   Electrical Stimulation Location L buttock    Electrical Stimulation Action Pre-Mod    Electrical Stimulation Parameters 80-150 hz x15 min    Electrical Stimulation Goals Pain      Manual Therapy   Manual Therapy Soft tissue mobilization    Soft tissue mobilization STW to L glute, piriformis, lumbar paraspinals to reduce pain                          PT Long Term Goals - 08/11/21 1347       PT LONG TERM GOAL #1   Title Independent with a HEP.    Time 6    Period Weeks    Status New      PT LONG TERM GOAL #2   Title Perform ADL's with pain not > 3/10.    Time 6    Period Weeks    Status New      PT LONG TERM GOAL #3   Title Walk a community distance without experiencing left buttock pain and pain not > 3/10.    Time 6    Period Weeks    Status New                   Plan - 09/15/21 1339     Clinical Impression Statement Patient presented in clinic with reports of less sciatic related pain. Patient able to try therex introduction  with no increased pain during or after nustep session. Moderate tone palpable in  L buttock today but no sensitivity reported by patient. Normal modalities response noted following removal of the modalities. Patient denied any increased symptoms following end of treatment.    Personal Factors and Comorbidities Comorbidity 1;Other    Comorbidities Back surgery x 2, CABG, Mitral valave replacement, COPD, HTN.    Examination-Activity Limitations Other;Locomotion Level    Examination-Participation Restrictions Other    Stability/Clinical Decision Making Evolving/Moderate complexity    Rehab Potential Good    PT Frequency 2x / week    PT Duration 6 weeks    PT Treatment/Interventions ADLs/Self Care Home Management;Cryotherapy;Electrical Stimulation;Ultrasound;Moist Heat;Therapeutic activities;Therapeutic exercise;Manual techniques;Patient/family education;Passive range of motion    PT Next Visit Plan Conservative treatments and STW/M as needed to decrease pain with slow progression into low-level core exercies.    Consulted and Agree with Plan of Care Patient             Patient will benefit from skilled therapeutic intervention in order to improve the following deficits and impairments:  Pain, Decreased activity tolerance, Postural dysfunction  Visit Diagnosis: Acute left-sided low back pain with left-sided sciatica     Problem List Patient Active Problem List   Diagnosis Date Noted   Tricuspid regurgitation 07/14/2021   Protein-calorie malnutrition, severe 09/05/2019   Dehydration    Gastroenteritis    Enteritis 09/03/2019   COVID-19 08/27/2019   Dysuria 07/07/2019   Chronic anxiety 12/23/2017   Aortic stenosis, mild 07/26/2017   Dyslipidemia 12/20/2016   S/P mitral valve replacement with bioprosthetic valve + CABG x1 + maze procedure 10/14/2016   S/P Maze operation for atrial fibrillation 10/14/2016   S/P CABG x 1 10/14/2016   Cellulitis of left lower extremity 08/17/2016    Acute on chronic diastolic heart failure ([facility])    Coronary artery disease    Chronic insomnia 07/12/2016   DOE (dyspnea on exertion) 03/26/2015   Warfarin-induced coagulopathy ([place]) 03/16/2015   Valvular heart disease 03/16/2015   COPD GOLD II  03/16/2015   Nephrolithiasis 03/16/2015   Essential hypertension    RHEUMATIC MITRAL STENOSIS 01/05/2008   Rheumatic heart disease 01/05/2008   Atrial fibrillation ([place]) 01/05/2008    Standley Brooking, PTA 09/15/2021, 2:51 PM  [facility] [address] [place], [place], [address] Phone: [phone]   Fax:  [phone]  Name: Catherine [name] MRN: [id number] Date of Birth: 27-May-1935

## 2021-09-22 ENCOUNTER — Ambulatory Visit: Payer: Medicare Other | Admitting: Physical Therapy

## 2021-09-22 ENCOUNTER — Encounter: Payer: Self-pay | Admitting: Physical Therapy

## 2021-09-22 ENCOUNTER — Other Ambulatory Visit: Payer: Self-pay

## 2021-09-22 DIAGNOSIS — M5442 Lumbago with sciatica, left side: Secondary | ICD-10-CM

## 2021-09-22 NOTE — Therapy (Signed)
[facility] [place], [place], [address] Phone: [phone]   Fax:  [phone]  Physical Therapy Treatment  Patient Details  Name: Catherine [name] MRN: [id number] Date of Birth: December 14, 1934 Referring Provider (PT): Fenton Malling NP   Encounter Date: 09/22/2021   PT End of Session - 09/22/21 1438     Visit Number 7    Number of Visits 12    Date for PT Re-Evaluation 09/22/21    PT Start Time 1350    PT Stop Time 1430    PT Time Calculation (min) 40 min    Activity Tolerance Patient tolerated treatment well    Behavior During Therapy [facility] for tasks assessed/performed             Past Medical History:  Diagnosis Date   Acute on chronic diastolic heart failure ([place])    Aortic stenosis, mild 07/26/2017   Arthritis    Asthma    last attack 02/2015   Atrial fibrillation, chronic ([place])    Cellulitis of left lower extremity 08/17/2016   Ulcer associated with severe venous insufficiency   Chronic anticoagulation    Chronic diastolic CHF (congestive heart failure) ([place])    Chronic kidney disease    "RIGHT MANY KIDNEY INFECTIONS AND STONES"   COPD (chronic obstructive pulmonary disease) ([place])    Coronary artery disease    Dizziness    H/O: rheumatic fever    Heart murmur    Hypertension    PONV (postoperative nausea and vomiting)    ' SOMETIMES', BUT NOT ALWAYS"   S/P Maze operation for atrial fibrillation 10/14/2016   Complete bilateral atrial lesion set using cryothermy and bipolar radiofrequency ablation - atrial appendage was not treated due to previous surgical procedure (open mitral commissurotomy)   S/P mitral valve replacement with bioprosthetic valve 10/14/2016   29 mm Medtronic Mosaic porcine bioprosthetic tissue valve   Tricuspid regurgitation 07/14/2021   UTI (urinary tract infection)    Valvular heart disease    Has mitral stenosis with prior mitral commissurotomy in 1970    Past Surgical History:   Procedure Laterality Date   ABDOMINAL HYSTERECTOMY  1983   endometriosis   APPENDECTOMY     BACK SURGERY     neurosurgery x2   CARDIAC CATHETERIZATION     CARDIAC CATHETERIZATION N/A 08/03/2016   Procedure: Right/Left Heart Cath and Coronary Angiography;  Surgeon: Peter M Martinique, MD;  Location: [facility];  Service: Cardiovascular;  Laterality: N/A;   cataract surg     CHOLECYSTECTOMY N/A 05/30/2015   Procedure: LAPAROSCOPIC CHOLECYSTECTOMY WITH INTRAOPERATIVE CHOLANGIOGRAM;  Surgeon: Excell Seltzer, MD;  Location: [facility];  Service: General;  Laterality: N/A;   COLONOSCOPY     CORONARY ARTERY BYPASS GRAFT N/A 10/14/2016   Procedure: CORONARY ARTERY BYPASS GRAFTING (CABG);  Surgeon: Rexene Alberts, MD;  Location: [facility];  Service: Open Heart Surgery;  Laterality: N/A;   EYE SURGERY     MAZE N/A 10/14/2016   Procedure: MAZE;  Surgeon: Rexene Alberts, MD;  Location: [facility];  Service: Open Heart Surgery;  Laterality: N/A;   MITRAL VALVE REPLACEMENT N/A 10/14/2016   Procedure: REDO MITRAL VALVE REPLACEMENT (MVR);  Surgeon: Rexene Alberts, MD;  Location: [facility];  Service: Open Heart Surgery;  Laterality: N/A;   MITRAL VALVE SURGERY Left 1970   Open mitral commissurotomy via left thoracotomy approach   TEE WITHOUT CARDIOVERSION N/A 07/05/2016   Procedure: TRANSESOPHAGEAL ECHOCARDIOGRAM (TEE);  Surgeon: [name],  MD;  Location: [facility];  Service: Cardiovascular;  Laterality: N/A;   TEE WITHOUT CARDIOVERSION N/A 10/14/2016   Procedure: TRANSESOPHAGEAL ECHOCARDIOGRAM (TEE);  Surgeon: Rexene Alberts, MD;  Location: [facility];  Service: Open Heart Surgery;  Laterality: N/A;    There were no vitals filed for this visit.   Subjective Assessment - 09/22/21 1346     Subjective More pain after last treatment and tried to go shopping but had to sit more.    Pertinent History Back surgery x 2, CABG, Mitral valave replacement, COPD, HTN.    How long can you walk comfortably? Varies.  Pain  hits suddenly without warning.    Diagnostic tests MRI scheduled.    Patient Stated Goals Get out of pain.    Currently in Pain? No/denies                [facility] PT Assessment - 09/22/21 0001       Assessment   Medical Diagnosis Lumbar radiculopathy    Referring Provider (PT) Fenton Malling NP      Precautions   Precautions None      Restrictions   Weight Bearing Restrictions No                           [facility] Adult PT Treatment/Exercise - 09/22/21 0001       Modalities   Modalities Electrical Stimulation;Moist Heat;Ultrasound      Moist Heat Therapy   Number Minutes Moist Heat 15 Minutes    Moist Heat Location Lumbar Spine      Electrical Stimulation   Electrical Stimulation Location L buttock    Electrical Stimulation Action IFC    Electrical Stimulation Parameters 80-150 hz x15 min    Electrical Stimulation Goals Pain      Ultrasound   Ultrasound Location L buttock    Ultrasound Parameters Combo 1.5 w/cm2, 100%, 1 mhz x10 min    Ultrasound Goals Pain      Manual Therapy   Manual Therapy Soft tissue mobilization    Soft tissue mobilization STW to L glute, piriformis, lumbar paraspinals to reduce pain                          PT Long Term Goals - 08/11/21 1347       PT LONG TERM GOAL #1   Title Independent with a HEP.    Time 6    Period Weeks    Status New      PT LONG TERM GOAL #2   Title Perform ADL's with pain not > 3/10.    Time 6    Period Weeks    Status New      PT LONG TERM GOAL #3   Title Walk a community distance without experiencing left buttock pain and pain not > 3/10.    Time 6    Period Weeks    Status New                   Plan - 09/22/21 1506     Clinical Impression Statement Patient presented in clinic with reports of more sciatic related pain after last visit when therex was lightly introduced. Patient unable to stand and walk for prolonged periods due to pain experienced. No  current pain upon her arrival for today's visit. Patient presented with mod tightness of the L glute and piriformis. Normal modalities response noted following removal  of the modalities.    Personal Factors and Comorbidities Comorbidity 1;Other    Comorbidities Back surgery x 2, CABG, Mitral valave replacement, COPD, HTN.    Examination-Activity Limitations Other;Locomotion Level    Examination-Participation Restrictions Other    Stability/Clinical Decision Making Evolving/Moderate complexity    Rehab Potential Good    PT Frequency 2x / week    PT Duration 6 weeks    PT Treatment/Interventions ADLs/Self Care Home Management;Cryotherapy;Electrical Stimulation;Ultrasound;Moist Heat;Therapeutic activities;Therapeutic exercise;Manual techniques;Patient/family education;Passive range of motion    PT Next Visit Plan Conservative treatments and STW/M as needed to decrease pain with slow progression into low-level core exercies.    Consulted and Agree with Plan of Care Patient             Patient will benefit from skilled therapeutic intervention in order to improve the following deficits and impairments:  Pain, Decreased activity tolerance, Postural dysfunction  Visit Diagnosis: Acute left-sided low back pain with left-sided sciatica     Problem List Patient Active Problem List   Diagnosis Date Noted   Tricuspid regurgitation 07/14/2021   Protein-calorie malnutrition, severe 09/05/2019   Dehydration    Gastroenteritis    Enteritis 09/03/2019   COVID-19 08/27/2019   Dysuria 07/07/2019   Chronic anxiety 12/23/2017   Aortic stenosis, mild 07/26/2017   Dyslipidemia 12/20/2016   S/P mitral valve replacement with bioprosthetic valve + CABG x1 + maze procedure 10/14/2016   S/P Maze operation for atrial fibrillation 10/14/2016   S/P CABG x 1 10/14/2016   Cellulitis of left lower extremity 08/17/2016   Acute on chronic diastolic heart failure ([facility])    Coronary artery disease    Chronic  insomnia 07/12/2016   DOE (dyspnea on exertion) 03/26/2015   Warfarin-induced coagulopathy ([place]) 03/16/2015   Valvular heart disease 03/16/2015   COPD GOLD II  03/16/2015   Nephrolithiasis 03/16/2015   Essential hypertension    RHEUMATIC MITRAL STENOSIS 01/05/2008   Rheumatic heart disease 01/05/2008   Atrial fibrillation ([place]) 01/05/2008    Standley Brooking, PTA 09/22/2021, 3:09 PM  [facility] [address] [place], [place], [address] Phone: [phone]   Fax:  [phone]  Name: Catherine [name] MRN: [id number] Date of Birth: 1935-06-13

## 2021-09-23 ENCOUNTER — Ambulatory Visit: Payer: Medicare Other

## 2021-09-28 ENCOUNTER — Other Ambulatory Visit: Payer: Self-pay | Admitting: Family Medicine

## 2021-09-28 DIAGNOSIS — I4891 Unspecified atrial fibrillation: Secondary | ICD-10-CM

## 2021-09-28 DIAGNOSIS — I4821 Permanent atrial fibrillation: Secondary | ICD-10-CM

## 2021-09-29 ENCOUNTER — Other Ambulatory Visit: Payer: Self-pay | Admitting: Internal Medicine

## 2021-09-29 ENCOUNTER — Ambulatory Visit: Payer: Medicare Other | Attending: Neurosurgery

## 2021-09-29 ENCOUNTER — Other Ambulatory Visit: Payer: Self-pay

## 2021-09-29 DIAGNOSIS — M5442 Lumbago with sciatica, left side: Secondary | ICD-10-CM | POA: Diagnosis not present

## 2021-09-29 NOTE — Telephone Encounter (Signed)
Pt is compliant with coumadin management and PCP apts. Sent in refill.

## 2021-09-29 NOTE — Therapy (Signed)
[facility] [place], [place], [address] Phone: [phone]   Fax:  [phone]  Physical Therapy Treatment  Patient Details  Name: Catherine [name] MRN: [id number] Date of Birth: 1935-09-18 Referring Provider (PT): Fenton Malling NP   Encounter Date: 09/29/2021   PT End of Session - 09/29/21 1352     Visit Number 8    Number of Visits 12    Date for PT Re-Evaluation 09/22/21    PT Start Time 5701    PT Stop Time 1435    PT Time Calculation (min) 50 min    Activity Tolerance Patient tolerated treatment well    Behavior During Therapy [facility] for tasks assessed/performed             Past Medical History:  Diagnosis Date   Acute on chronic diastolic heart failure ([place])    Aortic stenosis, mild 07/26/2017   Arthritis    Asthma    last attack 02/2015   Atrial fibrillation, chronic ([place])    Cellulitis of left lower extremity 08/17/2016   Ulcer associated with severe venous insufficiency   Chronic anticoagulation    Chronic diastolic CHF (congestive heart failure) ([place])    Chronic kidney disease    "RIGHT MANY KIDNEY INFECTIONS AND STONES"   COPD (chronic obstructive pulmonary disease) ([place])    Coronary artery disease    Dizziness    H/O: rheumatic fever    Heart murmur    Hypertension    PONV (postoperative nausea and vomiting)    ' SOMETIMES', BUT NOT ALWAYS"   S/P Maze operation for atrial fibrillation 10/14/2016   Complete bilateral atrial lesion set using cryothermy and bipolar radiofrequency ablation - atrial appendage was not treated due to previous surgical procedure (open mitral commissurotomy)   S/P mitral valve replacement with bioprosthetic valve 10/14/2016   29 mm Medtronic Mosaic porcine bioprosthetic tissue valve   Tricuspid regurgitation 07/14/2021   UTI (urinary tract infection)    Valvular heart disease    Has mitral stenosis with prior mitral commissurotomy in 1970    Past Surgical History:   Procedure Laterality Date   ABDOMINAL HYSTERECTOMY  1983   endometriosis   APPENDECTOMY     BACK SURGERY     neurosurgery x2   CARDIAC CATHETERIZATION     CARDIAC CATHETERIZATION N/A 08/03/2016   Procedure: Right/Left Heart Cath and Coronary Angiography;  Surgeon: Peter M Martinique, MD;  Location: [facility];  Service: Cardiovascular;  Laterality: N/A;   cataract surg     CHOLECYSTECTOMY N/A 05/30/2015   Procedure: LAPAROSCOPIC CHOLECYSTECTOMY WITH INTRAOPERATIVE CHOLANGIOGRAM;  Surgeon: Excell Seltzer, MD;  Location: [facility];  Service: General;  Laterality: N/A;   COLONOSCOPY     CORONARY ARTERY BYPASS GRAFT N/A 10/14/2016   Procedure: CORONARY ARTERY BYPASS GRAFTING (CABG);  Surgeon: Rexene Alberts, MD;  Location: [facility];  Service: Open Heart Surgery;  Laterality: N/A;   EYE SURGERY     MAZE N/A 10/14/2016   Procedure: MAZE;  Surgeon: Rexene Alberts, MD;  Location: [facility];  Service: Open Heart Surgery;  Laterality: N/A;   MITRAL VALVE REPLACEMENT N/A 10/14/2016   Procedure: REDO MITRAL VALVE REPLACEMENT (MVR);  Surgeon: Rexene Alberts, MD;  Location: [facility];  Service: Open Heart Surgery;  Laterality: N/A;   MITRAL VALVE SURGERY Left 1970   Open mitral commissurotomy via left thoracotomy approach   TEE WITHOUT CARDIOVERSION N/A 07/05/2016   Procedure: TRANSESOPHAGEAL ECHOCARDIOGRAM (TEE);  Surgeon: [name],  MD;  Location: [facility];  Service: Cardiovascular;  Laterality: N/A;   TEE WITHOUT CARDIOVERSION N/A 10/14/2016   Procedure: TRANSESOPHAGEAL ECHOCARDIOGRAM (TEE);  Surgeon: Rexene Alberts, MD;  Location: [facility];  Service: Open Heart Surgery;  Laterality: N/A;    There were no vitals filed for this visit.   Subjective Assessment - 09/29/21 1352     Subjective Pt arrives for today's treatment denying any pain, but reluctant to try Nustep due to increased pain after last attempt.    Pertinent History Back surgery x 2, CABG, Mitral valave replacement, COPD, HTN.    How  long can you walk comfortably? Varies.  Pain hits suddenly without warning.    Diagnostic tests MRI scheduled.    Patient Stated Goals Get out of pain.                               [facility] Adult PT Treatment/Exercise - 09/29/21 0001       Exercises   Exercises Lumbar      Lumbar Exercises: Aerobic   Nustep Lvl 2 x 12 mins   Decreased resistance per pt request     Modalities   Modalities Electrical Stimulation;Moist Heat;Ultrasound      Moist Heat Therapy   Number Minutes Moist Heat 15 Minutes    Moist Heat Location Lumbar Spine      Electrical Stimulation   Electrical Stimulation Location L low back and buttock    Electrical Stimulation Action IFC    Electrical Stimulation Parameters 80-150 Hz x 15 mins    Electrical Stimulation Goals Pain      Manual Therapy   Manual Therapy Soft tissue mobilization    Soft tissue mobilization STW to L glute, piriformis, lumbar paraspinals to reduce pain                          PT Long Term Goals - 09/29/21 1353       PT LONG TERM GOAL #1   Title Independent with a HEP.    Baseline Not currently performing any    Time 6    Period Weeks    Status On-going      PT LONG TERM GOAL #2   Title Perform ADL's with pain not > 3/10.    Time 6    Period Weeks    Status Achieved      PT LONG TERM GOAL #3   Title Walk a community distance without experiencing left buttock pain and pain not > 3/10.    Time 6    Period Weeks    Status On-going                   Plan - 09/29/21 1352     Clinical Impression Statement Pt arrives for today's treatment session denying any pain.  Pt states that she is feeling very good today and is nervous to attempt Nustep bc it made her back hurt for up to 2 days after the fact.  Pt agreeable to trying Nustep at lower resistance level without issue.  STW/M performed to left glute and piriformis with mild tightness noted.  Normal responses noted to estim and moist  heat upon removal of modalities.  Pt denied any pain at completion of today's treatment.    Personal Factors and Comorbidities Comorbidity 1;Other    Comorbidities Back surgery x 2, CABG, Mitral valave replacement, COPD,  HTN.    Examination-Activity Limitations Other;Locomotion Level    Examination-Participation Restrictions Other    Stability/Clinical Decision Making Evolving/Moderate complexity    Rehab Potential Good    PT Frequency 2x / week    PT Duration 6 weeks    PT Treatment/Interventions ADLs/Self Care Home Management;Cryotherapy;Electrical Stimulation;Ultrasound;Moist Heat;Therapeutic activities;Therapeutic exercise;Manual techniques;Patient/family education;Passive range of motion    PT Next Visit Plan Conservative treatments and STW/M as needed to decrease pain with slow progression into low-level core exercies.    Consulted and Agree with Plan of Care Patient             Patient will benefit from skilled therapeutic intervention in order to improve the following deficits and impairments:  Pain, Decreased activity tolerance, Postural dysfunction  Visit Diagnosis: Acute left-sided low back pain with left-sided sciatica     Problem List Patient Active Problem List   Diagnosis Date Noted   Tricuspid regurgitation 07/14/2021   Protein-calorie malnutrition, severe 09/05/2019   Dehydration    Gastroenteritis    Enteritis 09/03/2019   COVID-19 08/27/2019   Dysuria 07/07/2019   Chronic anxiety 12/23/2017   Aortic stenosis, mild 07/26/2017   Dyslipidemia 12/20/2016   S/P mitral valve replacement with bioprosthetic valve + CABG x1 + maze procedure 10/14/2016   S/P Maze operation for atrial fibrillation 10/14/2016   S/P CABG x 1 10/14/2016   Cellulitis of left lower extremity 08/17/2016   Acute on chronic diastolic heart failure ([facility])    Coronary artery disease    Chronic insomnia 07/12/2016   DOE (dyspnea on exertion) 03/26/2015   Warfarin-induced coagulopathy ([place])  03/16/2015   Valvular heart disease 03/16/2015   COPD GOLD II  03/16/2015   Nephrolithiasis 03/16/2015   Essential hypertension    RHEUMATIC MITRAL STENOSIS 01/05/2008   Rheumatic heart disease 01/05/2008   Atrial fibrillation ([place]) 01/05/2008    Catherine [name], PTA 09/29/2021, 2:37 PM  [facility] [address] [place], [place], [address] Phone: [phone]   Fax:  [phone]  Name: Catherine [name] MRN: [id number] Date of Birth: 1935-08-02

## 2021-10-06 ENCOUNTER — Ambulatory Visit: Payer: Medicare Other | Admitting: Physical Therapy

## 2021-10-06 ENCOUNTER — Encounter: Payer: Self-pay | Admitting: Physical Therapy

## 2021-10-06 ENCOUNTER — Other Ambulatory Visit: Payer: Self-pay

## 2021-10-06 DIAGNOSIS — M5442 Lumbago with sciatica, left side: Secondary | ICD-10-CM

## 2021-10-06 NOTE — Therapy (Addendum)
[facility] [place], [place], [address] Phone: [phone]   Fax:  [phone]  Physical Therapy Treatment  Patient Details  Name: Catherine [name] MRN: [id number] Date of Birth: Jan 19, 1935 Referring Provider (PT): Fenton Malling NP   Encounter Date: 10/06/2021   PT End of Session - 10/06/21 6945     Visit Number 9    Number of Visits 12    Date for PT Re-Evaluation 09/22/21    PT Start Time 0388    PT Stop Time 8280    PT Time Calculation (min) 40 min    Activity Tolerance Patient tolerated treatment well    Behavior During Therapy [facility] for tasks assessed/performed             Past Medical History:  Diagnosis Date   Acute on chronic diastolic heart failure ([place])    Aortic stenosis, mild 07/26/2017   Arthritis    Asthma    last attack 02/2015   Atrial fibrillation, chronic ([place])    Cellulitis of left lower extremity 08/17/2016   Ulcer associated with severe venous insufficiency   Chronic anticoagulation    Chronic diastolic CHF (congestive heart failure) ([place])    Chronic kidney disease    "RIGHT MANY KIDNEY INFECTIONS AND STONES"   COPD (chronic obstructive pulmonary disease) ([place])    Coronary artery disease    Dizziness    H/O: rheumatic fever    Heart murmur    Hypertension    PONV (postoperative nausea and vomiting)    ' SOMETIMES', BUT NOT ALWAYS"   S/P Maze operation for atrial fibrillation 10/14/2016   Complete bilateral atrial lesion set using cryothermy and bipolar radiofrequency ablation - atrial appendage was not treated due to previous surgical procedure (open mitral commissurotomy)   S/P mitral valve replacement with bioprosthetic valve 10/14/2016   29 mm Medtronic Mosaic porcine bioprosthetic tissue valve   Tricuspid regurgitation 07/14/2021   UTI (urinary tract infection)    Valvular heart disease    Has mitral stenosis with prior mitral commissurotomy in 1970    Past Surgical History:   Procedure Laterality Date   ABDOMINAL HYSTERECTOMY  1983   endometriosis   APPENDECTOMY     BACK SURGERY     neurosurgery x2   CARDIAC CATHETERIZATION     CARDIAC CATHETERIZATION N/A 08/03/2016   Procedure: Right/Left Heart Cath and Coronary Angiography;  Surgeon: Peter M Martinique, MD;  Location: [facility];  Service: Cardiovascular;  Laterality: N/A;   cataract surg     CHOLECYSTECTOMY N/A 05/30/2015   Procedure: LAPAROSCOPIC CHOLECYSTECTOMY WITH INTRAOPERATIVE CHOLANGIOGRAM;  Surgeon: Excell Seltzer, MD;  Location: [facility];  Service: General;  Laterality: N/A;   COLONOSCOPY     CORONARY ARTERY BYPASS GRAFT N/A 10/14/2016   Procedure: CORONARY ARTERY BYPASS GRAFTING (CABG);  Surgeon: Rexene Alberts, MD;  Location: [facility];  Service: Open Heart Surgery;  Laterality: N/A;   EYE SURGERY     MAZE N/A 10/14/2016   Procedure: MAZE;  Surgeon: Rexene Alberts, MD;  Location: [facility];  Service: Open Heart Surgery;  Laterality: N/A;   MITRAL VALVE REPLACEMENT N/A 10/14/2016   Procedure: REDO MITRAL VALVE REPLACEMENT (MVR);  Surgeon: Rexene Alberts, MD;  Location: [facility];  Service: Open Heart Surgery;  Laterality: N/A;   MITRAL VALVE SURGERY Left 1970   Open mitral commissurotomy via left thoracotomy approach   TEE WITHOUT CARDIOVERSION N/A 07/05/2016   Procedure: TRANSESOPHAGEAL ECHOCARDIOGRAM (TEE);  Surgeon: [name],  MD;  Location: [facility];  Service: Cardiovascular;  Laterality: N/A;   TEE WITHOUT CARDIOVERSION N/A 10/14/2016   Procedure: TRANSESOPHAGEAL ECHOCARDIOGRAM (TEE);  Surgeon: Rexene Alberts, MD;  Location: [facility];  Service: Open Heart Surgery;  Laterality: N/A;    There were no vitals filed for this visit.   Subjective Assessment - 10/06/21 1345     Subjective Reports some discomfort in low back after jarring her back when she slipped going up bleachers. Has abrasion on her shin and is weary of exercise and reinjury as she takes Coumadin.    Pertinent History Back  surgery x 2, CABG, Mitral valave replacement, COPD, HTN.    How long can you walk comfortably? Varies.  Pain hits suddenly without warning.    Diagnostic tests MRI scheduled.    Patient Stated Goals Get out of pain.    Currently in Pain? No/denies                [facility] PT Assessment - 10/06/21 0001       Assessment   Medical Diagnosis Lumbar radiculopathy    Referring Provider (PT) Fenton Malling NP      Precautions   Precautions None                           [facility] Adult PT Treatment/Exercise - 10/06/21 0001       Modalities   Modalities Electrical Stimulation;Moist Heat;Ultrasound      Moist Heat Therapy   Number Minutes Moist Heat 15 Minutes    Moist Heat Location Lumbar Spine      Electrical Stimulation   Electrical Stimulation Location L low back and buttock    Electrical Stimulation Action IFC    Electrical Stimulation Parameters 80-150 hz x15 min    Electrical Stimulation Goals Pain      Ultrasound   Ultrasound Location L low back    Ultrasound Parameters Combo 1.5 w/cm2, 100%, 1 mhz x10 min    Ultrasound Goals Pain      Manual Therapy   Manual Therapy Soft tissue mobilization    Soft tissue mobilization STW to L glute, piriformis, lumbar paraspinals to reduce pain                          PT Long Term Goals - 09/29/21 1353       PT LONG TERM GOAL #1   Title Independent with a HEP.    Baseline Not currently performing any    Time 6    Period Weeks    Status On-going      PT LONG TERM GOAL #2   Title Perform ADL's with pain not > 3/10.    Time 6    Period Weeks    Status Achieved      PT LONG TERM GOAL #3   Title Walk a community distance without experiencing left buttock pain and pain not > 3/10.    Time 6    Period Weeks    Status On-going                   Plan - 10/06/21 1442     Clinical Impression Statement Patient presented in clinic with reports of more low level discomfort after  falling on bleachers over the weekend. Patient states that she sat down hard on her buttock. Patient requested more conservative treatment with no exercise as she has an abrasion  on her shin from the fall as well and is weary where she is on Coumadin. Normal modalities response noted following removal of the modalities. Patient has previously felt better and felt some relief in symptoms prior to the fall.    Personal Factors and Comorbidities Comorbidity 1;Other    Comorbidities Back surgery x 2, CABG, Mitral valave replacement, COPD, HTN.    Examination-Activity Limitations Other;Locomotion Level    Examination-Participation Restrictions Other    Stability/Clinical Decision Making Evolving/Moderate complexity    Rehab Potential Good    PT Frequency 2x / week    PT Duration 6 weeks    PT Treatment/Interventions ADLs/Self Care Home Management;Cryotherapy;Electrical Stimulation;Ultrasound;Moist Heat;Therapeutic activities;Therapeutic exercise;Manual techniques;Patient/family education;Passive range of motion    PT Next Visit Plan Conservative treatments and STW/M as needed to decrease pain with slow progression into low-level core exercies.    Consulted and Agree with Plan of Care Patient             Patient will benefit from skilled therapeutic intervention in order to improve the following deficits and impairments:  Pain, Decreased activity tolerance, Postural dysfunction  Visit Diagnosis: Acute left-sided low back pain with left-sided sciatica     Problem List Patient Active Problem List   Diagnosis Date Noted   Tricuspid regurgitation 07/14/2021   Protein-calorie malnutrition, severe 09/05/2019   Dehydration    Gastroenteritis    Enteritis 09/03/2019   COVID-19 08/27/2019   Dysuria 07/07/2019   Chronic anxiety 12/23/2017   Aortic stenosis, mild 07/26/2017   Dyslipidemia 12/20/2016   S/P mitral valve replacement with bioprosthetic valve + CABG x1 + maze procedure 10/14/2016    S/P Maze operation for atrial fibrillation 10/14/2016   S/P CABG x 1 10/14/2016   Cellulitis of left lower extremity 08/17/2016   Acute on chronic diastolic heart failure ([facility])    Coronary artery disease    Chronic insomnia 07/12/2016   DOE (dyspnea on exertion) 03/26/2015   Warfarin-induced coagulopathy ([place]) 03/16/2015   Valvular heart disease 03/16/2015   COPD GOLD II  03/16/2015   Nephrolithiasis 03/16/2015   Essential hypertension    RHEUMATIC MITRAL STENOSIS 01/05/2008   Rheumatic heart disease 01/05/2008   Atrial fibrillation ([place]) 01/05/2008    Standley Brooking, PTA 10/06/2021, 2:45 PM  [facility] [address] [place], [place], [address] Phone: [phone]   Fax:  [phone]  Name: Catherine [name] MRN: [id number] Date of Birth: [birth year]   PHYSICAL THERAPY DISCHARGE SUMMARY  Visits from Start of Care: 9.  Current functional level related to goals / functional outcomes: See above.   Remaining deficits: See below.   Education / Equipment: HEP.   Patient agrees to discharge. Patient goals were not met. Patient is being discharged due to not returning since the last visit.    Mali Applegate MPT

## 2021-10-07 DIAGNOSIS — I10 Essential (primary) hypertension: Secondary | ICD-10-CM | POA: Diagnosis not present

## 2021-10-07 DIAGNOSIS — M5416 Radiculopathy, lumbar region: Secondary | ICD-10-CM | POA: Diagnosis not present

## 2021-10-13 ENCOUNTER — Encounter: Payer: Medicare Other | Admitting: Physical Therapy

## 2021-10-19 ENCOUNTER — Ambulatory Visit (INDEPENDENT_AMBULATORY_CARE_PROVIDER_SITE_OTHER): Payer: Medicare Other

## 2021-10-19 DIAGNOSIS — D6832 Hemorrhagic disorder due to extrinsic circulating anticoagulants: Secondary | ICD-10-CM

## 2021-10-19 DIAGNOSIS — T45515A Adverse effect of anticoagulants, initial encounter: Secondary | ICD-10-CM | POA: Diagnosis not present

## 2021-10-19 LAB — POCT INR: INR: 1.9 — AB (ref 2.0–3.0)

## 2021-10-19 NOTE — Patient Instructions (Addendum)
Pre visit review using our clinic review tool, if applicable. No additional management support is needed unless otherwise documented below in the visit note.  Increase dose today to take 1 1/2 tablets and then continue to take 1 tablet daily except 1/2 tablet on Wednesday and Saturday.  Re-check in 4 weeks.

## 2021-10-19 NOTE — Progress Notes (Signed)
Increase dose today to take 1 1/2 tablets and then continue to take 1 tablet daily except 1/2 tablet on Wednesday and Saturday.  Re-check in 4 weeks.

## 2021-10-25 ENCOUNTER — Other Ambulatory Visit: Payer: Self-pay

## 2021-10-25 ENCOUNTER — Emergency Department (HOSPITAL_BASED_OUTPATIENT_CLINIC_OR_DEPARTMENT_OTHER): Payer: Medicare Other

## 2021-10-25 ENCOUNTER — Emergency Department (HOSPITAL_BASED_OUTPATIENT_CLINIC_OR_DEPARTMENT_OTHER)
Admission: EM | Admit: 2021-10-25 | Discharge: 2021-10-25 | Disposition: A | Discharge status: Home / Self Care | Payer: Medicare Other | Attending: Emergency Medicine | Admitting: Emergency Medicine

## 2021-10-25 DIAGNOSIS — N189 Chronic kidney disease, unspecified: Secondary | ICD-10-CM | POA: Diagnosis not present

## 2021-10-25 DIAGNOSIS — J449 Chronic obstructive pulmonary disease, unspecified: Secondary | ICD-10-CM | POA: Insufficient documentation

## 2021-10-25 DIAGNOSIS — J45909 Unspecified asthma, uncomplicated: Secondary | ICD-10-CM | POA: Insufficient documentation

## 2021-10-25 DIAGNOSIS — R6889 Other general symptoms and signs: Secondary | ICD-10-CM

## 2021-10-25 DIAGNOSIS — J111 Influenza due to unidentified influenza virus with other respiratory manifestations: Secondary | ICD-10-CM | POA: Diagnosis not present

## 2021-10-25 DIAGNOSIS — Z20822 Contact with and (suspected) exposure to covid-19: Secondary | ICD-10-CM | POA: Diagnosis not present

## 2021-10-25 DIAGNOSIS — B349 Viral infection, unspecified: Secondary | ICD-10-CM

## 2021-10-25 DIAGNOSIS — R9431 Abnormal electrocardiogram [ECG] [EKG]: Secondary | ICD-10-CM | POA: Diagnosis not present

## 2021-10-25 DIAGNOSIS — I130 Hypertensive heart and chronic kidney disease with heart failure and stage 1 through stage 4 chronic kidney disease, or unspecified chronic kidney disease: Secondary | ICD-10-CM | POA: Diagnosis not present

## 2021-10-25 DIAGNOSIS — Z955 Presence of coronary angioplasty implant and graft: Secondary | ICD-10-CM | POA: Diagnosis not present

## 2021-10-25 DIAGNOSIS — I2510 Atherosclerotic heart disease of native coronary artery without angina pectoris: Secondary | ICD-10-CM | POA: Insufficient documentation

## 2021-10-25 DIAGNOSIS — I4891 Unspecified atrial fibrillation: Secondary | ICD-10-CM | POA: Diagnosis not present

## 2021-10-25 DIAGNOSIS — Z79899 Other long term (current) drug therapy: Secondary | ICD-10-CM | POA: Insufficient documentation

## 2021-10-25 DIAGNOSIS — I5033 Acute on chronic diastolic (congestive) heart failure: Secondary | ICD-10-CM | POA: Insufficient documentation

## 2021-10-25 DIAGNOSIS — Z7901 Long term (current) use of anticoagulants: Secondary | ICD-10-CM | POA: Insufficient documentation

## 2021-10-25 DIAGNOSIS — R0602 Shortness of breath: Secondary | ICD-10-CM | POA: Diagnosis not present

## 2021-10-25 LAB — COMPREHENSIVE METABOLIC PANEL
ALT: 17 U/L (ref 0–44)
AST: 22 U/L (ref 15–41)
Albumin: 4 g/dL (ref 3.5–5.0)
Alkaline Phosphatase: 72 U/L (ref 38–126)
Anion gap: 9 (ref 5–15)
BUN: 14 mg/dL (ref 8–23)
CO2: 30 mmol/L (ref 22–32)
Calcium: 9.2 mg/dL (ref 8.9–10.3)
Chloride: 103 mmol/L (ref 98–111)
Creatinine, Ser: 0.97 mg/dL (ref 0.44–1.00)
GFR, Estimated: 58 mL/min — ABNORMAL LOW (ref >60)
Glucose, Bld: 105 mg/dL — ABNORMAL HIGH (ref 70–99)
Potassium: 3.6 mmol/L (ref 3.5–5.1)
Sodium: 142 mmol/L (ref 135–145)
Total Bilirubin: 1.3 mg/dL — ABNORMAL HIGH (ref 0.3–1.2)
Total Protein: 6.8 g/dL (ref 6.5–8.1)

## 2021-10-25 LAB — CBC WITH DIFFERENTIAL/PLATELET
Abs Immature Granulocytes: 0.05 10*3/uL (ref 0.00–0.07)
Basophils Absolute: 0 10*3/uL (ref 0.0–0.1)
Basophils Relative: 0 %
Eosinophils Absolute: 0.1 10*3/uL (ref 0.0–0.5)
Eosinophils Relative: 0 %
HCT: 38.3 % (ref 36.0–46.0)
Hemoglobin: 12.9 g/dL (ref 12.0–15.0)
Immature Granulocytes: 0 %
Lymphocytes Relative: 10 %
Lymphs Abs: 1.2 10*3/uL (ref 0.7–4.0)
MCH: 30.8 pg (ref 26.0–34.0)
MCHC: 33.7 g/dL (ref 30.0–36.0)
MCV: 91.4 fL (ref 80.0–100.0)
Monocytes Absolute: 0.9 10*3/uL (ref 0.1–1.0)
Monocytes Relative: 8 %
Neutro Abs: 9.7 10*3/uL — ABNORMAL HIGH (ref 1.7–7.7)
Neutrophils Relative %: 82 %
Platelets: 134 10*3/uL — ABNORMAL LOW (ref 150–400)
RBC: 4.19 MIL/uL (ref 3.87–5.11)
RDW: 14.1 % (ref 11.5–15.5)
WBC: 11.9 10*3/uL — ABNORMAL HIGH (ref 4.0–10.5)
nRBC: 0 % (ref 0.0–0.2)

## 2021-10-25 LAB — RESP PANEL BY RT-PCR (FLU A&B, COVID) ARPGX2
Influenza A by PCR: NEGATIVE
Influenza B by PCR: NEGATIVE
SARS Coronavirus 2 by RT PCR: NEGATIVE

## 2021-10-25 LAB — BRAIN NATRIURETIC PEPTIDE: B Natriuretic Peptide: 250.6 pg/mL — ABNORMAL HIGH (ref 0.0–100.0)

## 2021-10-25 LAB — TROPONIN I (HIGH SENSITIVITY)
Troponin I (High Sensitivity): 17 ng/L (ref <18)
Troponin I (High Sensitivity): 17 ng/L (ref <18)

## 2021-10-25 LAB — LIPASE, BLOOD: Lipase: 17 U/L (ref 11–51)

## 2021-10-25 MED ORDER — ACETAMINOPHEN 325 MG PO TABS
650.0000 mg | ORAL_TABLET | Freq: Once | ORAL | Status: AC
  Administered 2021-10-25: 650 mg via ORAL
  Filled 2021-10-25: qty 2

## 2021-10-25 MED ORDER — ONDANSETRON HCL 4 MG/2ML IJ SOLN
4.0000 mg | Freq: Once | INTRAMUSCULAR | Status: AC
  Administered 2021-10-25: 4 mg via INTRAVENOUS
  Filled 2021-10-25: qty 2

## 2021-10-25 MED ORDER — SODIUM CHLORIDE 0.9 % IV BOLUS
1000.0000 mL | Freq: Once | INTRAVENOUS | Status: AC
  Administered 2021-10-25: 1000 mL via INTRAVENOUS

## 2021-10-25 NOTE — ED Notes (Signed)
Instructed patient on use of IS, returned demo.

## 2021-10-25 NOTE — Discharge Instructions (Addendum)
Work-up here today influenza COVID testing negative.  Symptoms seem to be consistent with a viral syndrome.  Symptomatic over-the-counter treatment for flulike illness.  Follow-up with your doctor.

## 2021-10-25 NOTE — ED Triage Notes (Signed)
Pt complain os headache, runny nose, coughing, nausea and SOB x 2 days. This morning fever 102. 500mg  tylenol taken at 10 am.

## 2021-10-25 NOTE — ED Provider Notes (Signed)
She turned over to me waiting flu and COVID test.  They both were negative.  Patient has been sick since Friday.  Seems to be remaining stable.  Patient was on 2 L of oxygen.  We will verify that her oxygen sats are good off the oxygen.  Then would be symptomatic treatment over-the-counter cold and flu type medicines and follow back up with their provider.   Fredia Sorrow, MD 10/25/21 [phone]

## 2021-10-25 NOTE — ED Provider Notes (Signed)
[facility] EMERGENC[facility] DEPT Provider Note   CSN: [id number] Arrival date & time: 10/25/21  1216     History  Chief Complaint  Patient presents with   Nausea   Fever   Shortness of Breath    Catherine [name] is a 87 y.o. female.  This is a 87 y.o. female with significant medical history as below, including atrial fibrillation, CKD, diastolic heart failure who presents to the ED with complaint of fatigue, cough, nausea, fever, congestion, postnasal drip, headache.  T-max 102.  Last took antipyretic around 10 AM this morning.  Patient denies sick contacts or recent travel.  She has received COVID-vaccine x3.  She was diagnosed with COVID-19 in 2019.  Patient reports poor appetite over the past 24 hours.  Nausea without vomiting.  She has been tolerating liquids and a small meal this morning.  No change in bowel or bladder function.  No chest pain or dyspnea.  Cough has been nonproductive.  No rashes.  She has mild headache described as aching, pressure sensation to her frontal portion.  No numbness or tingling, no vision changes, no difficulty speaking or swallowing.  Patient does report history of COPD, using home inhalers.  No home oxygen use.  She has mild dyspnea slightly worse than her baseline.    Past Medical History: No date: Acute on chronic diastolic heart failure ([place]) 07/26/2017: Aortic stenosis, mild No date: Arthritis No date: Asthma     Comment:  last attack 02/2015 No date: Atrial fibrillation, chronic ([place]) 08/17/2016: Cellulitis of left lower extremity     Comment:  Ulcer associated with severe venous insufficiency No date: Chronic anticoagulation No date: Chronic diastolic CHF (congestive heart failure) (HCC) No date: Chronic kidney disease     Comment:  "RIGHT MANY KIDNEY INFECTIONS AND STONES" No date: COPD (chronic obstructive pulmonary disease) (HCC) No date: Coronary artery disease No date: Dizziness No date: H/O: rheumatic fever No date: Heart  murmur No date: Hypertension No date: PONV (postoperative nausea and vomiting)     Comment:  ' SOMETIMES', BUT NOT ALWAYS" 10/14/2016: S/P Maze operation for atrial fibrillation     Comment:  Complete bilateral atrial lesion set using cryothermy               and bipolar radiofrequency ablation - atrial appendage               was not treated due to previous surgical procedure (open               mitral commissurotomy) 10/14/2016: S/P mitral valve replacement with bioprosthetic valve     Comment:  29 mm [employer] Mosaic porcine bioprosthetic tissue               valve 07/14/2021: Tricuspid regurgitation No date: UTI (urinary tract infection) No date: Valvular heart disease     Comment:  Has mitral stenosis with prior mitral commissurotomy in               1970  Past Surgical History: 1983: ABDOMINAL HYSTERECTOMY     Comment:  endometriosis No date: APPENDECTOMY No date: BACK SURGERY     Comment:  neurosurgery x2 No date: CARDIAC CATHETERIZATION 08/03/2016: CARDIAC CATHETERIZATION; N/A     Comment:  Procedure: Right/Left Heart Cath and Coronary               Angiography;  Surgeon: Peter M Martinique, MD;  Location: [facility]  INVASIVE CV LAB;  Service: Cardiovascular;  Laterality:               N/A; No date: cataract surg 05/30/2015: CHOLECYSTECTOMY; N/A     Comment:  Procedure: LAPAROSCOPIC CHOLECYSTECTOMY WITH               INTRAOPERATIVE CHOLANGIOGRAM;  Surgeon: Excell Seltzer, MD;  Location: [place];  Service: General;                Laterality: N/A; No date: COLONOSCOPY 10/14/2016: CORONARY ARTERY BYPASS GRAFT; N/A     Comment:  Procedure: CORONARY ARTERY BYPASS GRAFTING (CABG);                Surgeon: Rexene Alberts, MD;  Location: [place];  Service:              Open Heart Surgery;  Laterality: N/A; No date: EYE SURGERY 10/14/2016: MAZE; N/A     Comment:  Procedure: MAZE;  Surgeon: Rexene Alberts, MD;                Location: [place];  Service: Open Heart  Surgery;                Laterality: N/A; 10/14/2016: MITRAL VALVE REPLACEMENT; N/A     Comment:  Procedure: REDO MITRAL VALVE REPLACEMENT (MVR);                Surgeon: Rexene Alberts, MD;  Location: [place];  Service:              Open Heart Surgery;  Laterality: N/A; 1970: MITRAL VALVE SURGERY; Left     Comment:  Open mitral commissurotomy via left thoracotomy approach 07/05/2016: TEE WITHOUT CARDIOVERSION; N/A     Comment:  Procedure: TRANSESOPHAGEAL ECHOCARDIOGRAM (TEE);                Surgeon: [name], MD;  Location: [place];                Service: Cardiovascular;  Laterality: N/A; 10/14/2016: TEE WITHOUT CARDIOVERSION; N/A     Comment:  Procedure: TRANSESOPHAGEAL ECHOCARDIOGRAM (TEE);                Surgeon: Rexene Alberts, MD;  Location: [place];  Service:              Open Heart Surgery;  Laterality: N/A;    The history is provided by the patient. No language interpreter was used.  Fever Associated symptoms: congestion, cough, nausea and rhinorrhea   Associated symptoms: no chest pain, no chills, no confusion, no headaches, no rash and no vomiting   Shortness of Breath Associated symptoms: cough and fever   Associated symptoms: no abdominal pain, no chest pain, no headaches, no rash and no vomiting       Home Medications Prior to Admission medications   Medication Sig Start Date End Date Taking? Authorizing Provider  warfarin (COUMADIN) 2 MG tablet TAKE ONE TABLET DAILY BY MOUTH EXCEPT TAKE 1/2 TABLET ON WEDNESDAYS AND SATURDAYS OR AS DIRECTED BY ANTICOAGULATION CLINIC 09/29/21   Burchette, Alinda Sierras, MD  acetaminophen (TYLENOL) 500 MG tablet Take 500 mg by mouth daily.    [provider]  albuterol (VENTOLIN HFA) 108 (90 Base) MCG/ACT inhaler Inhale 2 puffs into the lungs every 4 (four) hours as needed for wheezing or shortness of breath. 07/09/20   Christinia Gully  B, MD  cephALEXin (KEFLEX) 250 MG capsule TAKE ONE CAPSULE ONCE DAILY 07/15/21   Burchette, Alinda Sierras, MD  cholecalciferol (VITAMIN D3) 25 MCG (1000 UNIT) tablet Take 1 tablet (1,000 Units total) by mouth daily. 01/23/21   [name], MD  diazepam (VALIUM) 5 MG tablet TAKE ONE TABLET AT BEDTIME 05/11/21   Burchette, Alinda Sierras, MD  doxazosin (CARDURA) 8 MG tablet TAKE 1 TABLET DAILY 07/20/21   [name], MD  metoprolol succinate (TOPROL-XL) 25 MG 24 hr tablet TAKE 1 TABLET DAILY 07/07/21   Burchette, Alinda Sierras, MD  Multiple Vitamins-Minerals (HAIR/SKIN/NAILS/BIOTIN PO) Take 1 tablet by mouth daily.    [provider]  Multiple Vitamins-Minerals (MULTIVITAMIN ADULT EXTRA C) CHEW Chew 2 tablets by mouth daily.    [provider]  potassium chloride (KLOR-CON) 10 MEQ tablet TAKE UP TO 8 TABLETS DAILY AS DIRECTED 07/14/21   [name], MD  rosuvastatin (CRESTOR) 5 MG tablet Take one tablet every Monday, wednesday Friday and Sunday 03/20/21   [name], MD  [facility] 80-4.5 MCG/ACT inhaler INHALE 2 PUFFS TWICE DAILY 09/29/21   Tanda Rockers, MD  torsemide (DEMADEX) 20 MG tablet Take 2 tablets (40mg  total) twice daily. You may take an extra tablet for a total dose of 3 tablets (60mg  total) twice daily as needed for weight gain or swelling. 05/22/21   Loel Dubonnet, NP  triamcinolone cream (KENALOG) 0.1 % Apply 1 application topically 2 (two) times daily. 07/14/21   Burchette, Alinda Sierras, MD      Allergies    Aldactone [spironolactone], Amoxicillin, Diltiazem, Flagyl [metronidazole hcl], Flovent [fluticasone propionate], Gabapentin, Lyrica [pregabalin], Quinidine, Simvastatin, Tramadol, Verapamil, Amlodipine, Crestor [rosuvastatin], Livalo [pitavastatin], Zetia [ezetimibe], Ace inhibitors, Benazepril hcl, Ciprocin-fluocin-procin [fluocinolone acetonide], Ciprofloxacin, Codeine, and Nitrofurantoin monohyd macro    Review of Systems   Review of Systems  Constitutional:  Positive for appetite change and fever. Negative for chills.  HENT:  Positive for congestion,  postnasal drip, rhinorrhea and sinus pain. Negative for facial swelling and trouble swallowing.   Eyes:  Negative for photophobia and visual disturbance.  Respiratory:  Positive for cough and shortness of breath.   Cardiovascular:  Negative for chest pain and palpitations.  Gastrointestinal:  Positive for nausea. Negative for abdominal pain and vomiting.  Endocrine: Negative for polydipsia and polyuria.  Genitourinary:  Negative for difficulty urinating and hematuria.  Musculoskeletal:  Negative for gait problem and joint swelling.  Skin:  Negative for pallor and rash.  Neurological:  Negative for syncope and headaches.  Psychiatric/Behavioral:  Negative for agitation and confusion.    Physical Exam Updated Vital Signs BP 121/60    Pulse 72    Temp 99 F (37.2 C) (Oral)    Resp 18    Ht 5\' 3"  (1.6 m)    Wt 66.7 kg    SpO2 91%    BMI 26.04 kg/m  Physical Exam Vitals and nursing note reviewed.  Constitutional:      General: She is not in acute distress.    Appearance: Normal appearance. She is well-developed. She is not ill-appearing, toxic-appearing or diaphoretic.  HENT:     Head: Normocephalic and atraumatic.     Comments: Mild tenderness with percussion of the forehead    Right Ear: External ear normal.     Left Ear: External ear normal.     Nose: Nose normal.     Mouth/Throat:     Mouth: Mucous membranes are moist.  Eyes:     General:  No scleral icterus.       Right eye: No discharge.        Left eye: No discharge.     Extraocular Movements: Extraocular movements intact.     Pupils: Pupils are equal, round, and reactive to light.  Cardiovascular:     Rate and Rhythm: Normal rate and regular rhythm.     Pulses: Normal pulses.     Heart sounds: Normal heart sounds.  Pulmonary:     Effort: Pulmonary effort is normal. No respiratory distress.     Breath sounds: Normal breath sounds. No decreased breath sounds.  Abdominal:     General: Abdomen is flat.     Palpations:  Abdomen is soft.     Tenderness: There is no abdominal tenderness. There is no right CVA tenderness or left CVA tenderness.  Musculoskeletal:        General: Normal range of motion.     Cervical back: Normal range of motion.     Right lower leg: No edema.     Left lower leg: No edema.  Skin:    General: Skin is warm and dry.     Capillary Refill: Capillary refill takes less than 2 seconds.  Neurological:     Mental Status: She is alert and oriented to person, place, and time.     GCS: GCS eye subscore is 4. GCS verbal subscore is 5. GCS motor subscore is 6.     Cranial Nerves: Cranial nerves 2-12 are intact.     Sensory: Sensation is intact.     Motor: Motor function is intact.     Coordination: Coordination is intact.  Psychiatric:        Mood and Affect: Mood normal.        Behavior: Behavior normal.    ED Results / Procedures / Treatments   Labs (all labs ordered are listed, but only abnormal results are displayed) Labs Reviewed  CBC WITH DIFFERENTIAL/PLATELET - Abnormal; Notable for the following components:      Result Value   WBC 11.9 (*)    Platelets 134 (*)    Neutro Abs 9.7 (*)    All other components within normal limits  BRAIN NATRIURETIC PEPTIDE - Abnormal; Notable for the following components:   B Natriuretic Peptide 250.6 (*)    All other components within normal limits  COMPREHENSIVE METABOLIC PANEL - Abnormal; Notable for the following components:   Glucose, Bld 105 (*)    Total Bilirubin 1.3 (*)    GFR, Estimated 58 (*)    All other components within normal limits  RESP PANEL BY RT-PCR (FLU A&B, COVID) ARPGX2  LIPASE, BLOOD  TROPONIN I (HIGH SENSITIVITY)  TROPONIN I (HIGH SENSITIVITY)    EKG EKG Interpretation  Date/Time:  Sunday October 25 2021 13:07:47 EST Ventricular Rate:  67 PR Interval:    QRS Duration: 96 QT Interval:  421 QTC Calculation: 445 R Axis:   82 Text Interpretation: Junctional rhythm Borderline right axis deviation Probable  anteroseptal infarct, old Nonspecific repol abnormality, inferior leads similar to prior tracing Confirmed by Wynona Dove ([id number]) on 10/25/2021 2:42:48 PM  Radiology DG Chest Port 1 View  Result Date: 10/25/2021 CLINICAL DATA:  Short of breath. EXAM: PORTABLE CHEST 1 VIEW COMPARISON:  07/09/2020 FINDINGS: Stable changes from previous CABG surgery. Cardiac silhouette normal in size. No mediastinal or hilar masses. Bilateral interstitial thickening, unchanged from the prior study. No lung consolidation. No convincing pleural effusion and no pneumothorax. Previous anterior cervical spine fusion.  Skeletal structures are grossly intact. IMPRESSION: 1. No acute cardiopulmonary disease. 2. Irregularly thickened interstitial markings that are unchanged from the prior exam, therefore presumed chronic. Electronically Signed   By: Lajean Manes M.D.   On: 10/25/2021 13:49    Procedures Procedures    Medications Ordered in ED Medications  ondansetron (ZOFRAN) injection 4 mg (4 mg Intravenous Given 10/25/21 1340)  sodium chloride 0.9 % bolus 1,000 mL (1,000 mLs Intravenous New Bag/Given 10/25/21 1343)  acetaminophen (TYLENOL) tablet 650 mg (650 mg Oral Given 10/25/21 1523)    ED Course/ Medical Decision Making/ A&P                           Medical Decision Making Amount and/or Complexity of Data Reviewed Labs: ordered. Radiology: ordered.  Risk OTC drugs. Prescription drug management.    CC: Nausea, dyspnea, URI symptoms  This patient presents to the Emergency Department for the above complaint. This involves an extensive number of treatment options and is a complaint that carries with it a high risk of complications and morbidity. Vital signs were reviewed. Serious etiologies considered.  Record review:  Previous records obtained and reviewed   Additional history obtained from spouse  Medical and surgical history as noted above.   Work up as above, notable for:  Labs & imaging results  that were available during my care of the patient were reviewed by me and considered in my medical decision making.   I ordered imaging studies which included chest x-ray and I independently visualized and interpreted imaging which showed no acute process  Cardiac monitoring reviewed and interpreted personally which shows normal sinus rhythm  Social determinants of health include - N/a  Management: Antiemetic, Tylenol, IV fluids  Reassessment:  Pt reports feeling better. Remains afebrile, tolerating PO.   Cardiac w/u negative, trop negative x2, ekg without acute ischemia and cxr negative. Favor viral syndrome as etiology of complaints today.  Labs /cxr are re-assuring. Pending viral swab at this time. This has been signed out to incoming EDP. If viral panel negative viral viral syndrome as etiology of complaints today, support care for home. Strict return precautions.       This chart was dictated using voice recognition software.  Despite best efforts to proofread,  errors can occur which can change the documentation meaning.         Final Clinical Impression(s) / ED Diagnoses Final diagnoses:  Viral syndrome  Flu-like symptoms    Rx / DC Orders ED Discharge Orders     None         Jeanell Sparrow, DO 10/25/21 2013

## 2021-10-25 NOTE — ED Notes (Signed)
Dc instructions reviewed with patient. Patient voiced understanding. Dc with belongings.  °

## 2021-10-27 ENCOUNTER — Telehealth: Payer: Self-pay | Admitting: Family Medicine

## 2021-10-27 MED ORDER — ONDANSETRON HCL 4 MG PO TABS: 4.0000 mg | ORAL_TABLET | Freq: Three times a day (TID) | ORAL | 0 refills | Status: DC | PRN

## 2021-10-27 NOTE — Telephone Encounter (Signed)
Please advise. Pt was recently seen in the ED for viral symptoms.

## 2021-10-27 NOTE — Telephone Encounter (Signed)
Pt call and stated she spoke with someone yesterday about sending a message to dr.Burchette to send her something in nausea at  [place], [place] Phone:  [phone]  Fax:  [phone]    She also stated triage told her to call back to the office.

## 2021-10-27 NOTE — Telephone Encounter (Signed)
Rx sent in. Spoke with the patient and she is aware. 

## 2021-10-28 NOTE — Telephone Encounter (Signed)
error 

## 2021-10-29 ENCOUNTER — Emergency Department (HOSPITAL_BASED_OUTPATIENT_CLINIC_OR_DEPARTMENT_OTHER): Payer: Medicare Other | Admitting: Radiology

## 2021-10-29 ENCOUNTER — Encounter (HOSPITAL_BASED_OUTPATIENT_CLINIC_OR_DEPARTMENT_OTHER): Payer: Self-pay

## 2021-10-29 ENCOUNTER — Emergency Department (HOSPITAL_BASED_OUTPATIENT_CLINIC_OR_DEPARTMENT_OTHER): Payer: Medicare Other

## 2021-10-29 ENCOUNTER — Inpatient Hospital Stay (HOSPITAL_BASED_OUTPATIENT_CLINIC_OR_DEPARTMENT_OTHER)
Admission: EM | Admit: 2021-10-29 | Discharge: 2021-11-03 | DRG: 190 | Disposition: A | Discharge status: Home / Self Care | Payer: Medicare Other | Attending: Internal Medicine | Admitting: Internal Medicine

## 2021-10-29 ENCOUNTER — Other Ambulatory Visit: Payer: Self-pay

## 2021-10-29 DIAGNOSIS — I5033 Acute on chronic diastolic (congestive) heart failure: Secondary | ICD-10-CM | POA: Diagnosis present

## 2021-10-29 DIAGNOSIS — Z7951 Long term (current) use of inhaled steroids: Secondary | ICD-10-CM

## 2021-10-29 DIAGNOSIS — R300 Dysuria: Secondary | ICD-10-CM | POA: Diagnosis present

## 2021-10-29 DIAGNOSIS — Z20822 Contact with and (suspected) exposure to covid-19: Secondary | ICD-10-CM | POA: Diagnosis present

## 2021-10-29 DIAGNOSIS — Z87891 Personal history of nicotine dependence: Secondary | ICD-10-CM | POA: Diagnosis not present

## 2021-10-29 DIAGNOSIS — J90 Pleural effusion, not elsewhere classified: Secondary | ICD-10-CM | POA: Diagnosis not present

## 2021-10-29 DIAGNOSIS — Z953 Presence of xenogenic heart valve: Secondary | ICD-10-CM

## 2021-10-29 DIAGNOSIS — N390 Urinary tract infection, site not specified: Secondary | ICD-10-CM | POA: Diagnosis not present

## 2021-10-29 DIAGNOSIS — R110 Nausea: Secondary | ICD-10-CM

## 2021-10-29 DIAGNOSIS — I10 Essential (primary) hypertension: Secondary | ICD-10-CM | POA: Diagnosis present

## 2021-10-29 DIAGNOSIS — R9431 Abnormal electrocardiogram [ECG] [EKG]: Secondary | ICD-10-CM | POA: Diagnosis not present

## 2021-10-29 DIAGNOSIS — Z888 Allergy status to other drugs, medicaments and biological substances status: Secondary | ICD-10-CM

## 2021-10-29 DIAGNOSIS — I082 Rheumatic disorders of both aortic and tricuspid valves: Secondary | ICD-10-CM | POA: Diagnosis not present

## 2021-10-29 DIAGNOSIS — R509 Fever, unspecified: Secondary | ICD-10-CM | POA: Diagnosis not present

## 2021-10-29 DIAGNOSIS — B9620 Unspecified Escherichia coli [E. coli] as the cause of diseases classified elsewhere: Secondary | ICD-10-CM | POA: Diagnosis present

## 2021-10-29 DIAGNOSIS — R0600 Dyspnea, unspecified: Secondary | ICD-10-CM

## 2021-10-29 DIAGNOSIS — M546 Pain in thoracic spine: Secondary | ICD-10-CM | POA: Diagnosis not present

## 2021-10-29 DIAGNOSIS — Z951 Presence of aortocoronary bypass graft: Secondary | ICD-10-CM | POA: Diagnosis not present

## 2021-10-29 DIAGNOSIS — J441 Chronic obstructive pulmonary disease with (acute) exacerbation: Secondary | ICD-10-CM | POA: Diagnosis not present

## 2021-10-29 DIAGNOSIS — R011 Cardiac murmur, unspecified: Secondary | ICD-10-CM | POA: Diagnosis not present

## 2021-10-29 DIAGNOSIS — J9811 Atelectasis: Secondary | ICD-10-CM | POA: Diagnosis present

## 2021-10-29 DIAGNOSIS — R778 Other specified abnormalities of plasma proteins: Secondary | ICD-10-CM

## 2021-10-29 DIAGNOSIS — I130 Hypertensive heart and chronic kidney disease with heart failure and stage 1 through stage 4 chronic kidney disease, or unspecified chronic kidney disease: Secondary | ICD-10-CM | POA: Diagnosis present

## 2021-10-29 DIAGNOSIS — Z7901 Long term (current) use of anticoagulants: Secondary | ICD-10-CM | POA: Diagnosis not present

## 2021-10-29 DIAGNOSIS — Z885 Allergy status to narcotic agent status: Secondary | ICD-10-CM | POA: Diagnosis not present

## 2021-10-29 DIAGNOSIS — R0602 Shortness of breath: Secondary | ICD-10-CM | POA: Diagnosis not present

## 2021-10-29 DIAGNOSIS — Z880 Allergy status to penicillin: Secondary | ICD-10-CM | POA: Diagnosis not present

## 2021-10-29 DIAGNOSIS — I2510 Atherosclerotic heart disease of native coronary artery without angina pectoris: Secondary | ICD-10-CM | POA: Diagnosis not present

## 2021-10-29 DIAGNOSIS — I482 Chronic atrial fibrillation, unspecified: Secondary | ICD-10-CM | POA: Diagnosis not present

## 2021-10-29 DIAGNOSIS — Z8616 Personal history of COVID-19: Secondary | ICD-10-CM

## 2021-10-29 DIAGNOSIS — N1831 Chronic kidney disease, stage 3a: Secondary | ICD-10-CM | POA: Diagnosis not present

## 2021-10-29 DIAGNOSIS — R0789 Other chest pain: Secondary | ICD-10-CM | POA: Diagnosis not present

## 2021-10-29 DIAGNOSIS — J9601 Acute respiratory failure with hypoxia: Secondary | ICD-10-CM | POA: Diagnosis not present

## 2021-10-29 DIAGNOSIS — M549 Dorsalgia, unspecified: Secondary | ICD-10-CM

## 2021-10-29 DIAGNOSIS — R0609 Other forms of dyspnea: Secondary | ICD-10-CM | POA: Diagnosis not present

## 2021-10-29 DIAGNOSIS — I4891 Unspecified atrial fibrillation: Secondary | ICD-10-CM | POA: Diagnosis present

## 2021-10-29 DIAGNOSIS — I509 Heart failure, unspecified: Secondary | ICD-10-CM | POA: Diagnosis not present

## 2021-10-29 DIAGNOSIS — J811 Chronic pulmonary edema: Secondary | ICD-10-CM | POA: Diagnosis not present

## 2021-10-29 DIAGNOSIS — Z79899 Other long term (current) drug therapy: Secondary | ICD-10-CM | POA: Diagnosis not present

## 2021-10-29 DIAGNOSIS — J449 Chronic obstructive pulmonary disease, unspecified: Secondary | ICD-10-CM | POA: Diagnosis not present

## 2021-10-29 LAB — CBC WITH DIFFERENTIAL/PLATELET
Abs Immature Granulocytes: 0.02 10*3/uL (ref 0.00–0.07)
Basophils Absolute: 0 10*3/uL (ref 0.0–0.1)
Basophils Relative: 0 %
Eosinophils Absolute: 0.1 10*3/uL (ref 0.0–0.5)
Eosinophils Relative: 1 %
HCT: 34.9 % — ABNORMAL LOW (ref 36.0–46.0)
Hemoglobin: 11.5 g/dL — ABNORMAL LOW (ref 12.0–15.0)
Immature Granulocytes: 0 %
Lymphocytes Relative: 13 %
Lymphs Abs: 1 10*3/uL (ref 0.7–4.0)
MCH: 30.6 pg (ref 26.0–34.0)
MCHC: 33 g/dL (ref 30.0–36.0)
MCV: 92.8 fL (ref 80.0–100.0)
Monocytes Absolute: 0.8 10*3/uL (ref 0.1–1.0)
Monocytes Relative: 11 %
Neutro Abs: 5.8 10*3/uL (ref 1.7–7.7)
Neutrophils Relative %: 75 %
Platelets: 150 10*3/uL (ref 150–400)
RBC: 3.76 MIL/uL — ABNORMAL LOW (ref 3.87–5.11)
RDW: 13.9 % (ref 11.5–15.5)
WBC: 7.8 10*3/uL (ref 4.0–10.5)
nRBC: 0 % (ref 0.0–0.2)

## 2021-10-29 LAB — TROPONIN I (HIGH SENSITIVITY)
Troponin I (High Sensitivity): 26 ng/L — ABNORMAL HIGH (ref <18)
Troponin I (High Sensitivity): 23 ng/L — ABNORMAL HIGH (ref <18)

## 2021-10-29 LAB — COMPREHENSIVE METABOLIC PANEL
ALT: 17 U/L (ref 0–44)
AST: 20 U/L (ref 15–41)
Albumin: 3.7 g/dL (ref 3.5–5.0)
Alkaline Phosphatase: 71 U/L (ref 38–126)
Anion gap: 9 (ref 5–15)
BUN: 14 mg/dL (ref 8–23)
CO2: 30 mmol/L (ref 22–32)
Calcium: 8.9 mg/dL (ref 8.9–10.3)
Chloride: 104 mmol/L (ref 98–111)
Creatinine, Ser: 0.89 mg/dL (ref 0.44–1.00)
GFR, Estimated: >60 mL/min (ref >60)
Glucose, Bld: 107 mg/dL — ABNORMAL HIGH (ref 70–99)
Potassium: 3.6 mmol/L (ref 3.5–5.1)
Sodium: 143 mmol/L (ref 135–145)
Total Bilirubin: 0.9 mg/dL (ref 0.3–1.2)
Total Protein: 6.7 g/dL (ref 6.5–8.1)

## 2021-10-29 LAB — BRAIN NATRIURETIC PEPTIDE: B Natriuretic Peptide: 234.5 pg/mL — ABNORMAL HIGH (ref 0.0–100.0)

## 2021-10-29 LAB — RESP PANEL BY RT-PCR (FLU A&B, COVID) ARPGX2
Influenza A by PCR: NEGATIVE
Influenza B by PCR: NEGATIVE
SARS Coronavirus 2 by RT PCR: NEGATIVE

## 2021-10-29 LAB — LIPASE, BLOOD: Lipase: 23 U/L (ref 11–51)

## 2021-10-29 LAB — PROTIME-INR
INR: 3.5 — ABNORMAL HIGH (ref 0.8–1.2)
Prothrombin Time: 35.1 seconds — ABNORMAL HIGH (ref 11.4–15.2)

## 2021-10-29 LAB — D-DIMER, QUANTITATIVE: D-Dimer, Quant: 1.14 ug/mL-FEU — ABNORMAL HIGH (ref 0.00–0.50)

## 2021-10-29 MED ORDER — DOXYCYCLINE HYCLATE 100 MG PO TABS
100.0000 mg | ORAL_TABLET | Freq: Once | ORAL | Status: AC
  Administered 2021-10-29: 100 mg via ORAL
  Filled 2021-10-29: qty 1

## 2021-10-29 MED ORDER — ONDANSETRON HCL 4 MG/2ML IJ SOLN
4.0000 mg | Freq: Once | INTRAMUSCULAR | Status: AC
  Administered 2021-10-30: 4 mg via INTRAVENOUS
  Filled 2021-10-29: qty 2

## 2021-10-29 MED ORDER — CEPHALEXIN 250 MG PO CAPS
500.0000 mg | ORAL_CAPSULE | Freq: Once | ORAL | Status: AC
  Administered 2021-10-29: 500 mg via ORAL
  Filled 2021-10-29: qty 2

## 2021-10-29 MED ORDER — IOHEXOL 350 MG/ML SOLN
100.0000 mL | Freq: Once | INTRAVENOUS | Status: AC | PRN
  Administered 2021-10-29: 100 mL via INTRAVENOUS

## 2021-10-29 NOTE — ED Notes (Signed)
[facility] at bedside 

## 2021-10-29 NOTE — ED Triage Notes (Signed)
Patient here POV from Home with Nausea, Fever, SOB.  Patient was seen here 01/29 for Same and was discharged but Patient returns for Evaluation due to Symptoms not becoming better.  NAD Noted during Triage. A&Ox4. GCS 15. BIB Wheelchair.

## 2021-10-29 NOTE — ED Provider Notes (Signed)
[facility]ERG[facility] DEPT Provider Note   CSN: [id number] Arrival date & time: 10/29/21  1727     History  Chief Complaint  Patient presents with   Shortness of Breath    Catherine [name] is a 87 y.o. female.  With past medical history of COPD not on home O2, hypertension, atrial fibrillation, mitral valve replacement in 7412, diastolic heart failure who presents to the emergency department with shortness of breath.  Patient states that she was here on Sunday for similar complaint.  She states that since being discharge she has not felt improved.  She states that over the past week she has continued to have fever up to 103.  She states that she has tried to take aspirin for this with no relief of symptoms.  She states that she feels like she is having worsening difficulty breathing.  She states that she is used both of her inhalers without relief of symptoms.  Additionally she endorses ongoing dry cough, nausea without vomiting.  She has had decreased p.o. intake.  She denies chest pain, palpitations, worsening lower extremity edema.  Denies lightheadedness or dizziness.  Denies urinary symptoms.  On chart review patient was here on 10/25/2021 for shortness of breath and fever.  At that time WBC 11.9, BNP 250, COVID and flu negative, EKG without ischemia or infarction, x2 troponin negative chest x-ray without evidence of pneumonia.  She was discharged with viral syndrome.   Shortness of Breath Associated symptoms: cough, fever and wheezing   Associated symptoms: no abdominal pain, no chest pain, no headaches and no vomiting       Home Medications Prior to Admission medications   Medication Sig Start Date End Date Taking? Authorizing Provider  warfarin (COUMADIN) 2 MG tablet TAKE ONE TABLET DAILY BY MOUTH EXCEPT TAKE 1/2 TABLET ON WEDNESDAYS AND SATURDAYS OR AS DIRECTED BY ANTICOAGULATION CLINIC 09/29/21   Burchette, Alinda Sierras, MD  acetaminophen (TYLENOL) 500 MG tablet Take 500  mg by mouth daily.    [provider]  albuterol (VENTOLIN HFA) 108 (90 Base) MCG/ACT inhaler Inhale 2 puffs into the lungs every 4 (four) hours as needed for wheezing or shortness of breath. 07/09/20   Tanda Rockers, MD  cephALEXin (KEFLEX) 250 MG capsule TAKE ONE CAPSULE ONCE DAILY 07/15/21   Burchette, Alinda Sierras, MD  cholecalciferol (VITAMIN D3) 25 MCG (1000 UNIT) tablet Take 1 tablet (1,000 Units total) by mouth daily. 01/23/21   [name], MD  diazepam (VALIUM) 5 MG tablet TAKE ONE TABLET AT BEDTIME 05/11/21   Burchette, Alinda Sierras, MD  doxazosin (CARDURA) 8 MG tablet TAKE 1 TABLET DAILY 07/20/21   [name], MD  metoprolol succinate (TOPROL-XL) 25 MG 24 hr tablet TAKE 1 TABLET DAILY 07/07/21   Burchette, Alinda Sierras, MD  Multiple Vitamins-Minerals (HAIR/SKIN/NAILS/BIOTIN PO) Take 1 tablet by mouth daily.    [provider]  Multiple Vitamins-Minerals (MULTIVITAMIN ADULT EXTRA C) CHEW Chew 2 tablets by mouth daily.    [provider]  ondansetron (ZOFRAN) 4 MG tablet Take 1 tablet (4 mg total) by mouth every 8 (eight) hours as needed for nausea or vomiting. 10/27/21   Burchette, Alinda Sierras, MD  potassium chloride (KLOR-CON) 10 MEQ tablet TAKE UP TO 8 TABLETS DAILY AS DIRECTED 07/14/21   [name], MD  rosuvastatin (CRESTOR) 5 MG tablet Take one tablet every Monday, wednesday Friday and Sunday 03/20/21   [name], MD  [facility] 80-4.5 MCG/ACT inhaler INHALE 2 PUFFS TWICE DAILY 09/29/21  Tanda Rockers, MD  torsemide (DEMADEX) 20 MG tablet Take 2 tablets (40mg  total) twice daily. You may take an extra tablet for a total dose of 3 tablets (60mg  total) twice daily as needed for weight gain or swelling. 05/22/21   Loel Dubonnet, NP  triamcinolone cream (KENALOG) 0.1 % Apply 1 application topically 2 (two) times daily. 07/14/21   Burchette, Alinda Sierras, MD      Allergies    Aldactone [spironolactone], Amoxicillin, Diltiazem, Flagyl [metronidazole hcl],  Flovent [fluticasone propionate], Gabapentin, Lyrica [pregabalin], Quinidine, Simvastatin, Tramadol, Verapamil, Amlodipine, Crestor [rosuvastatin], Livalo [pitavastatin], Zetia [ezetimibe], Ace inhibitors, Benazepril hcl, Ciprocin-fluocin-procin [fluocinolone acetonide], Ciprofloxacin, Codeine, and Nitrofurantoin monohyd macro    Review of Systems   Review of Systems  Constitutional:  Positive for appetite change and fever.  Respiratory:  Positive for cough, shortness of breath and wheezing.   Cardiovascular:  Negative for chest pain, palpitations and leg swelling.  Gastrointestinal:  Positive for nausea. Negative for abdominal pain and vomiting.  Genitourinary:  Negative for dysuria.  Neurological:  Negative for dizziness, syncope, light-headedness and headaches.  All other systems reviewed and are negative.  Physical Exam Updated Vital Signs BP (!) 143/52    Pulse (!) 59    Temp 98.9 F (37.2 C) (Oral)    Resp 14    Ht 5\' 3"  (1.6 m)    Wt 66.7 kg    SpO2 94%    BMI 26.05 kg/m  Physical Exam Vitals and nursing note reviewed.  Constitutional:      General: She is not in acute distress.    Appearance: Normal appearance. She is well-developed and normal weight. She is ill-appearing. She is not toxic-appearing.  HENT:     Head: Normocephalic and atraumatic.     Mouth/Throat:     Mouth: Mucous membranes are moist.     Pharynx: Oropharynx is clear.  Eyes:     General: No scleral icterus.    Extraocular Movements: Extraocular movements intact.     Pupils: Pupils are equal, round, and reactive to light.  Neck:     Vascular: No JVD.  Cardiovascular:     Rate and Rhythm: Regular rhythm. Bradycardia present.     Pulses: Normal pulses.     Heart sounds: No murmur heard.    Comments: Trace bilateral lower extremity edema Pulmonary:     Effort: Pulmonary effort is normal. No tachypnea, accessory muscle usage or respiratory distress.     Breath sounds: Examination of the right-upper field  reveals decreased breath sounds. Examination of the right-middle field reveals decreased breath sounds. Decreased breath sounds present. No wheezing.  Chest:     Chest wall: No tenderness.  Abdominal:     General: Bowel sounds are normal.     Palpations: Abdomen is soft.  Musculoskeletal:        General: Normal range of motion.     Cervical back: Normal range of motion and neck supple.  Skin:    General: Skin is warm and dry.     Capillary Refill: Capillary refill takes less than 2 seconds.     Coloration: Skin is not cyanotic.  Neurological:     General: No focal deficit present.     Mental Status: She is alert and oriented to person, place, and time.  Psychiatric:        Mood and Affect: Mood normal.        Behavior: Behavior normal.    ED Results / Procedures / Treatments  Labs (all labs ordered are listed, but only abnormal results are displayed) Labs Reviewed  CBC WITH DIFFERENTIAL/PLATELET - Abnormal; Notable for the following components:      Result Value   RBC 3.76 (*)    Hemoglobin 11.5 (*)    HCT 34.9 (*)    All other components within normal limits  COMPREHENSIVE METABOLIC PANEL - Abnormal; Notable for the following components:   Glucose, Bld 107 (*)    All other components within normal limits  D-DIMER, QUANTITATIVE - Abnormal; Notable for the following components:   D-Dimer, Quant 1.14 (*)    All other components within normal limits  BRAIN NATRIURETIC PEPTIDE - Abnormal; Notable for the following components:   B Natriuretic Peptide 234.5 (*)    All other components within normal limits  TROPONIN I (HIGH SENSITIVITY) - Abnormal; Notable for the following components:   Troponin I (High Sensitivity) 26 (*)    All other components within normal limits  TROPONIN I (HIGH SENSITIVITY) - Abnormal; Notable for the following components:   Troponin I (High Sensitivity) 23 (*)    All other components within normal limits  RESP PANEL BY RT-PCR (FLU A&B, COVID) [id number]   LIPASE, BLOOD  URINALYSIS, ROUTINE W REFLEX MICROSCOPIC    EKG EKG Interpretation  Date/Time:  Thursday October 29 2021 17:58:59 EST Ventricular Rate:  58 PR Interval:    QRS Duration: 88 QT Interval:  448 QTC Calculation: 439 R Axis:   53 Text Interpretation: Junctional rhythm Septal infarct , age undetermined Abnormal ECG When compared with ECG of 25-Oct-2021 13:07, PREVIOUS ECG IS PRESENT similar to prior Confirmed by Wynona Dove ([id number]) on 10/29/2021 8:35:41 PM  Radiology DG Chest 2 View  Result Date: 10/29/2021 CLINICAL DATA:  Dyspnea, fever, nausea EXAM: CHEST - 2 VIEW COMPARISON:  10/25/2021 FINDINGS: Lungs are clear. No pneumothorax or pleural effusion. Coronary artery bypass grafting has been performed. Cardiac size within normal limits. Pulmonary vascularity is normal. No acute bone abnormality. IMPRESSION: No active cardiopulmonary disease. Electronically Signed   By: Fidela Salisbury M.D.   On: 10/29/2021 18:17   CT Angio Chest PE W and/or Wo Contrast  Result Date: 10/29/2021 CLINICAL DATA:  Fever and shortness of breath EXAM: CT ANGIOGRAPHY CHEST WITH CONTRAST TECHNIQUE: Multidetector CT imaging of the chest was performed using the standard protocol during bolus administration of intravenous contrast. Multiplanar CT image reconstructions and MIPs were obtained to evaluate the vascular anatomy. RADIATION DOSE REDUCTION: This exam was performed according to the departmental dose-optimization program which includes automated exposure control, adjustment of the mA and/or kV according to patient size and/or use of iterative reconstruction technique. CONTRAST:  83 mL OMNIPAQUE IOHEXOL 350 MG/ML SOLN COMPARISON:  Chest x-ray from earlier in the same day. FINDINGS: Cardiovascular: Atherosclerotic calcifications of the thoracic aorta are noted. Changes consistent with prior coronary bypass grafting are noted. No aneurysmal dilatation is seen. Heart is at the upper limits of normal in size. The  pulmonary artery shows a normal branching pattern. No intraluminal filling defect to suggest pulmonary embolism is seen. Mild coronary calcifications are noted. Mediastinum/Nodes: Thoracic inlet is within normal limits. No sizable hilar or mediastinal adenopathy is noted. The esophagus as visualized is within normal limits. Lungs/Pleura: Mild right middle lobe atelectatic changes are noted. Small pleural effusions are noted bilaterally. No sizable parenchymal nodules are seen. No pneumothorax is noted. Upper Abdomen: Visualized upper abdomen is within normal limits. Musculoskeletal: Postsurgical changes in the cervical spine are seen. No acute bony abnormality is  noted. Review of the MIP images confirms the above findings. IMPRESSION: No evidence of pulmonary emboli. Right middle lobe atelectatic changes. Small pleural effusions are seen. Aortic Atherosclerosis (ICD10-I70.0). Electronically Signed   By: Inez Catalina M.D.   On: 10/29/2021 21:01    Procedures Procedures   Medications Ordered in ED Medications  doxycycline (VIBRA-TABS) tablet 100 mg (has no administration in time range)  cephALEXin (KEFLEX) capsule 500 mg (has no administration in time range)  iohexol (OMNIPAQUE) 350 MG/ML injection 100 mL (100 mLs Intravenous Contrast Given 10/29/21 2035)   ED Course/ Medical Decision Making/ A&P                           Medical Decision Making Amount and/or Complexity of Data Reviewed Labs: ordered. Radiology: ordered.  Risk Prescription drug management.  Patient presents to the ED with complaints of shortness of breath. This involves an extensive number of treatment options, and is a complaint that carries with it a high risk of complications and morbidity.   Additional history obtained:  Additional history obtained from: Husband External records from outside source obtained and reviewed including: Previous ED visit  EKG: EKG: unchanged from previous tracings junctional rhythm.    Cardiac Monitoring: The patient was maintained on a cardiac monitor.  I personally viewed and interpreted the cardiac monitored which showed an underlying rhythm of: Bradycardia  Lab Results: I personally ordered, reviewed, and interpreted labs. Pertinent results include: CBC without leukocytosis CMP without electrolyte derangement BMP 234 (previous 250) COVID and flu negative Initial troponin 26, second troponin 23, delta -3 D-dimer 1.14, elevated  Imaging Studies ordered:  I ordered imaging studies which included x-ray and CT.  I independently reviewed & interpreted imaging & am in agreement with radiology impression. Imaging shows: Chest x-ray: no active cardiopulmonary disease CTA PE study: Negative for pulmonary emboli, right middle lobe atelectatic changes  Medications  I ordered medication including Keflex and Doxycyline for CAP coverage Reevaluation of the patient after medication shows that patient stayed the same  Consultations: I requested consultation with the hospitalist, Hal Hope ,  and discussed lab and imaging findings as well as pertinent plan - they recommend: admission  ED Course: 87 year old female who presents to the emergency department with shortness of breath with almost two weeks of shortness of breath, fever. She was seen 1 week ago and discharged with presumed viral illness. Clinically, patient has decreased lung sounds a RUL, RML. CXR without overt pneumonia. CT is showing RML atelectatic changes. Question whether she is developing pneumonia at this time with underlying COPD.  Remainder over her work-up mostly unremarkable. Troponin x2 stable. Possible demand ischemia with slightly elevated troponin. However delta -3.  No PE on scan Presentation is not consistent with dissection, tamponade, pericarditis, myocarditis, PTX.  Patient ambulated on room air (baseline) and desaturation to 84%.  Given hypoxia, failed outpatient symptomatic care, decreased  PO intake in the setting of underlying COPD, will admit for ongoing care. Will initial PO CAP coverage.   After consideration of the diagnostic results and the patients response to treatment, I feel that the patent would benefit from admission. The patient has been appropriately medically screened and/or stabilized in the ED. I have low suspicion for any other emergent medical condition which would require further screening, evaluation or treatment in the ED or require inpatient management.  Final Clinical Impression(s) / ED Diagnoses Final diagnoses:  Shortness of breath    Rx / DC Orders  ED Discharge Orders     None         Mickie Hillier, PA-C 10/29/21 2301    Jeanell Sparrow, DO 10/30/21 Patrecia Pour

## 2021-10-29 NOTE — ED Notes (Signed)
Called [facility] to transport patient to [facility] room 5

## 2021-10-29 NOTE — ED Notes (Signed)
Pt up to restroom with assistance, pt got short of breath, sats 89-90%.  Pt used her personal albuterol inhaler.

## 2021-10-29 NOTE — ED Provider Notes (Incomplete)
[facility] EMERGENCY DEPT Provider Note   CSN: [id number] Arrival date & time: 10/29/21  1727     History {Add pertinent medical, surgical, social history, OB history to HPI:1} Chief Complaint  Patient presents with   Shortness of Breath    Catherine [name] is a 87 y.o. female.  This is a 87 y.o. ***  with significant medical history as below, including *** who presents to the ED with complaint of ***  Location:  *** Duration:  *** Onset:  *** Timing:  *** Description:  *** Severity:  *** Exacerbating/Alleviating Factors:  *** Associated Symptoms:  *** Pertinent Negatives:  *** Context: ***    Past Medical History: No date: Acute on chronic diastolic heart failure ([place]) 07/26/2017: Aortic stenosis, mild No date: Arthritis No date: Asthma     Comment:  last attack 02/2015 No date: Atrial fibrillation, chronic ([place]) 08/17/2016: Cellulitis of left lower extremity     Comment:  Ulcer associated with severe venous insufficiency No date: Chronic anticoagulation No date: Chronic diastolic CHF (congestive heart failure) (HCC) No date: Chronic kidney disease     Comment:  "RIGHT MANY KIDNEY INFECTIONS AND STONES" No date: COPD (chronic obstructive pulmonary disease) (HCC) No date: Coronary artery disease No date: Dizziness No date: H/O: rheumatic fever No date: Heart murmur No date: Hypertension No date: PONV (postoperative nausea and vomiting)     Comment:  ' SOMETIMES', BUT NOT ALWAYS" 10/14/2016: S/P Maze operation for atrial fibrillation     Comment:  Complete bilateral atrial lesion set using cryothermy               and bipolar radiofrequency ablation - atrial appendage               was not treated due to previous surgical procedure (open               mitral commissurotomy) 10/14/2016: S/P mitral valve replacement with bioprosthetic valve     Comment:  29 mm [employer] Mosaic porcine bioprosthetic tissue               valve 07/14/2021: Tricuspid  regurgitation No date: UTI (urinary tract infection) No date: Valvular heart disease     Comment:  Has mitral stenosis with prior mitral commissurotomy in               1970  Past Surgical History: 1983: ABDOMINAL HYSTERECTOMY     Comment:  endometriosis No date: APPENDECTOMY No date: BACK SURGERY     Comment:  neurosurgery x2 No date: CARDIAC CATHETERIZATION 08/03/2016: CARDIAC CATHETERIZATION; N/A     Comment:  Procedure: Right/Left Heart Cath and Coronary               Angiography;  Surgeon: Peter M Martinique, MD;  Location: [facility];  Service: Cardiovascular;  Laterality:               N/A; No date: cataract surg 05/30/2015: CHOLECYSTECTOMY; N/A     Comment:  Procedure: LAPAROSCOPIC CHOLECYSTECTOMY WITH               INTRAOPERATIVE CHOLANGIOGRAM;  Surgeon: Excell Seltzer, MD;  Location: [facility];  Service: General;                Laterality: N/A; No date: COLONOSCOPY 10/14/2016: CORONARY ARTERY BYPASS GRAFT;  N/A     Comment:  Procedure: CORONARY ARTERY BYPASS GRAFTING (CABG);                Surgeon: Rexene Alberts, MD;  Location: [facility];  Service:              Open Heart Surgery;  Laterality: N/A; No date: EYE SURGERY 10/14/2016: MAZE; N/A     Comment:  Procedure: MAZE;  Surgeon: Rexene Alberts, MD;                Location: [facility];  Service: Open Heart Surgery;                Laterality: N/A; 10/14/2016: MITRAL VALVE REPLACEMENT; N/A     Comment:  Procedure: REDO MITRAL VALVE REPLACEMENT (MVR);                Surgeon: Rexene Alberts, MD;  Location: [facility];  Service:              Open Heart Surgery;  Laterality: N/A; 1970: MITRAL VALVE SURGERY; Left     Comment:  Open mitral commissurotomy via left thoracotomy approach 07/05/2016: TEE WITHOUT CARDIOVERSION; N/A     Comment:  Procedure: TRANSESOPHAGEAL ECHOCARDIOGRAM (TEE);                Surgeon: [name], MD;  Location: [facility];                Service: Cardiovascular;   Laterality: N/A; 10/14/2016: TEE WITHOUT CARDIOVERSION; N/A     Comment:  Procedure: TRANSESOPHAGEAL ECHOCARDIOGRAM (TEE);                Surgeon: Rexene Alberts, MD;  Location: [facility];  Service:              Open Heart Surgery;  Laterality: N/A;     Shortness of Breath     Home Medications Prior to Admission medications   Medication Sig Start Date End Date Taking? Authorizing Provider  warfarin (COUMADIN) 2 MG tablet TAKE ONE TABLET DAILY BY MOUTH EXCEPT TAKE 1/2 TABLET ON WEDNESDAYS AND SATURDAYS OR AS DIRECTED BY ANTICOAGULATION CLINIC 09/29/21   Burchette, Alinda Sierras, MD  acetaminophen (TYLENOL) 500 MG tablet Take 500 mg by mouth daily.    [provider]  albuterol (VENTOLIN HFA) 108 (90 Base) MCG/ACT inhaler Inhale 2 puffs into the lungs every 4 (four) hours as needed for wheezing or shortness of breath. 07/09/20   Tanda Rockers, MD  cephALEXin (KEFLEX) 250 MG capsule TAKE ONE CAPSULE ONCE DAILY 07/15/21   Burchette, Alinda Sierras, MD  cholecalciferol (VITAMIN D3) 25 MCG (1000 UNIT) tablet Take 1 tablet (1,000 Units total) by mouth daily. 01/23/21   [name], MD  diazepam (VALIUM) 5 MG tablet TAKE ONE TABLET AT BEDTIME 05/11/21   Burchette, Alinda Sierras, MD  doxazosin (CARDURA) 8 MG tablet TAKE 1 TABLET DAILY 07/20/21   [name], MD  metoprolol succinate (TOPROL-XL) 25 MG 24 hr tablet TAKE 1 TABLET DAILY 07/07/21   Burchette, Alinda Sierras, MD  Multiple Vitamins-Minerals (HAIR/SKIN/NAILS/BIOTIN PO) Take 1 tablet by mouth daily.    [provider]  Multiple Vitamins-Minerals (MULTIVITAMIN ADULT EXTRA C) CHEW Chew 2 tablets by mouth daily.    [provider]  ondansetron (ZOFRAN) 4 MG tablet Take 1 tablet (4 mg total) by mouth every 8 (eight) hours as needed for nausea or vomiting. 10/27/21   Burchette, Alinda Sierras, MD  potassium chloride (KLOR-CON) 10  MEQ tablet TAKE UP TO 8 TABLETS DAILY AS DIRECTED 07/14/21   [name], MD  rosuvastatin (CRESTOR) 5 MG tablet  Take one tablet every Monday, wednesday Friday and Sunday 03/20/21   [name], MD  [facility] 80-4.5 MCG/ACT inhaler INHALE 2 PUFFS TWICE DAILY 09/29/21   Tanda Rockers, MD  torsemide (DEMADEX) 20 MG tablet Take 2 tablets (40mg  total) twice daily. You may take an extra tablet for a total dose of 3 tablets (60mg  total) twice daily as needed for weight gain or swelling. 05/22/21   Loel Dubonnet, NP  triamcinolone cream (KENALOG) 0.1 % Apply 1 application topically 2 (two) times daily. 07/14/21   Burchette, Alinda Sierras, MD      Allergies    Aldactone [spironolactone], Amoxicillin, Diltiazem, Flagyl [metronidazole hcl], Flovent [fluticasone propionate], Gabapentin, Lyrica [pregabalin], Quinidine, Simvastatin, Tramadol, Verapamil, Amlodipine, Crestor [rosuvastatin], Livalo [pitavastatin], Zetia [ezetimibe], Ace inhibitors, Benazepril hcl, Ciprocin-fluocin-procin [fluocinolone acetonide], Ciprofloxacin, Codeine, and Nitrofurantoin monohyd macro    Review of Systems   Review of Systems  Respiratory:  Positive for shortness of breath.    Physical Exam Updated Vital Signs BP (!) 143/57 (BP Location: Right Arm)    Pulse 60    Temp 98.9 F (37.2 C) (Oral)    Resp 16    Ht 5\' 3"  (1.6 m)    Wt 66.7 kg    SpO2 95%    BMI 26.05 kg/m  Physical Exam  ED Results / Procedures / Treatments   Labs (all labs ordered are listed, but only abnormal results are displayed) Labs Reviewed - No data to display  EKG None  Radiology DG Chest 2 View  Result Date: 10/29/2021 CLINICAL DATA:  Dyspnea, fever, nausea EXAM: CHEST - 2 VIEW COMPARISON:  10/25/2021 FINDINGS: Lungs are clear. No pneumothorax or pleural effusion. Coronary artery bypass grafting has been performed. Cardiac size within normal limits. Pulmonary vascularity is normal. No acute bone abnormality. IMPRESSION: No active cardiopulmonary disease. Electronically Signed   By: Fidela Salisbury M.D.   On: 10/29/2021 18:17    Procedures Procedures   {Document cardiac monitor, telemetry assessment procedure when appropriate:1}  Medications Ordered in ED Medications - No data to display  ED Course/ Medical Decision Making/ A&P                           Medical Decision Making Amount and/or Complexity of Data Reviewed Radiology: ordered.    CC: ***  This patient presents to the Emergency Department for the above complaint. This involves an extensive number of treatment options and is a complaint that carries with it a high risk of complications and morbidity. Vital signs were reviewed. Serious etiologies considered.  Record review:  Previous records obtained and reviewed ***  Additional history obtained from ***  Medical and surgical history as noted above.   Work up as above, notable for:   ***  [facility] & imaging results that were available during my care of the patient were reviewed by me and considered in my medical decision making.   I ordered imaging studies which included *** and I independently visualized and interpreted imaging which showed ***  Cardiac monitoring reviewed and interpreted personally which shows ***  Social determinants of health include - N/a  Personally discussed patient care with consultant; ***  Management: ***  Reassessment:  ***       This chart was dictated using voice recognition software.  Despite best efforts to proofread,  errors can occur which can change the documentation meaning.   {Document critical care time when appropriate:1} {Document review of labs and clinical decision tools ie heart score, Chads2Vasc2 etc:1}  {Document your independent review of radiology images, and any outside records:1} {Document your discussion with family members, caretakers, and with consultants:1} {Document social determinants of health affecting pt's care:1} {Document your decision making why or why not admission, treatments were needed:1} Final Clinical Impression(s) / ED  Diagnoses Final diagnoses:  None    Rx / DC Orders ED Discharge Orders     None

## 2021-10-30 ENCOUNTER — Observation Stay (HOSPITAL_COMMUNITY): Payer: Medicare Other

## 2021-10-30 DIAGNOSIS — Z20822 Contact with and (suspected) exposure to covid-19: Secondary | ICD-10-CM | POA: Diagnosis present

## 2021-10-30 DIAGNOSIS — Z8616 Personal history of COVID-19: Secondary | ICD-10-CM | POA: Diagnosis not present

## 2021-10-30 DIAGNOSIS — Z87891 Personal history of nicotine dependence: Secondary | ICD-10-CM | POA: Diagnosis not present

## 2021-10-30 DIAGNOSIS — I130 Hypertensive heart and chronic kidney disease with heart failure and stage 1 through stage 4 chronic kidney disease, or unspecified chronic kidney disease: Secondary | ICD-10-CM | POA: Diagnosis present

## 2021-10-30 DIAGNOSIS — Z953 Presence of xenogenic heart valve: Secondary | ICD-10-CM | POA: Diagnosis not present

## 2021-10-30 DIAGNOSIS — Z7951 Long term (current) use of inhaled steroids: Secondary | ICD-10-CM | POA: Diagnosis not present

## 2021-10-30 DIAGNOSIS — R110 Nausea: Secondary | ICD-10-CM

## 2021-10-30 DIAGNOSIS — Z951 Presence of aortocoronary bypass graft: Secondary | ICD-10-CM | POA: Diagnosis not present

## 2021-10-30 DIAGNOSIS — J9811 Atelectasis: Secondary | ICD-10-CM | POA: Diagnosis present

## 2021-10-30 DIAGNOSIS — R0609 Other forms of dyspnea: Secondary | ICD-10-CM | POA: Diagnosis not present

## 2021-10-30 DIAGNOSIS — Z7901 Long term (current) use of anticoagulants: Secondary | ICD-10-CM | POA: Diagnosis not present

## 2021-10-30 DIAGNOSIS — Z888 Allergy status to other drugs, medicaments and biological substances status: Secondary | ICD-10-CM | POA: Diagnosis not present

## 2021-10-30 DIAGNOSIS — I5033 Acute on chronic diastolic (congestive) heart failure: Secondary | ICD-10-CM | POA: Diagnosis present

## 2021-10-30 DIAGNOSIS — N390 Urinary tract infection, site not specified: Secondary | ICD-10-CM | POA: Diagnosis present

## 2021-10-30 DIAGNOSIS — I082 Rheumatic disorders of both aortic and tricuspid valves: Secondary | ICD-10-CM | POA: Diagnosis present

## 2021-10-30 DIAGNOSIS — B9620 Unspecified Escherichia coli [E. coli] as the cause of diseases classified elsewhere: Secondary | ICD-10-CM | POA: Diagnosis present

## 2021-10-30 DIAGNOSIS — N1831 Chronic kidney disease, stage 3a: Secondary | ICD-10-CM | POA: Diagnosis present

## 2021-10-30 DIAGNOSIS — I482 Chronic atrial fibrillation, unspecified: Secondary | ICD-10-CM | POA: Diagnosis present

## 2021-10-30 DIAGNOSIS — J9601 Acute respiratory failure with hypoxia: Secondary | ICD-10-CM | POA: Diagnosis not present

## 2021-10-30 DIAGNOSIS — Z79899 Other long term (current) drug therapy: Secondary | ICD-10-CM | POA: Diagnosis not present

## 2021-10-30 DIAGNOSIS — J441 Chronic obstructive pulmonary disease with (acute) exacerbation: Secondary | ICD-10-CM | POA: Diagnosis present

## 2021-10-30 DIAGNOSIS — Z880 Allergy status to penicillin: Secondary | ICD-10-CM | POA: Diagnosis not present

## 2021-10-30 DIAGNOSIS — R778 Other specified abnormalities of plasma proteins: Secondary | ICD-10-CM | POA: Diagnosis present

## 2021-10-30 DIAGNOSIS — R011 Cardiac murmur, unspecified: Secondary | ICD-10-CM | POA: Diagnosis present

## 2021-10-30 DIAGNOSIS — I2510 Atherosclerotic heart disease of native coronary artery without angina pectoris: Secondary | ICD-10-CM | POA: Diagnosis present

## 2021-10-30 DIAGNOSIS — Z885 Allergy status to narcotic agent status: Secondary | ICD-10-CM | POA: Diagnosis not present

## 2021-10-30 LAB — URINALYSIS, ROUTINE W REFLEX MICROSCOPIC
Bilirubin Urine: NEGATIVE
Glucose, UA: NEGATIVE mg/dL
Hgb urine dipstick: NEGATIVE
Ketones, ur: NEGATIVE mg/dL
Leukocytes,Ua: NEGATIVE
Nitrite: NEGATIVE
Protein, ur: NEGATIVE mg/dL
Specific Gravity, Urine: <1.005 — ABNORMAL LOW (ref 1.005–1.030)
pH: 7 (ref 5.0–8.0)

## 2021-10-30 LAB — ECHOCARDIOGRAM COMPLETE
AV Mean grad: 13 mmHg
AV Peak grad: 24.2 mmHg
Ao pk vel: 2.46 m/s
Area-P 1/2: 1.98 cm2
Height: 63 in
S' Lateral: 2.9 cm
Weight: 2376 oz

## 2021-10-30 MED ORDER — FUROSEMIDE 10 MG/ML IJ SOLN
40.0000 mg | Freq: Once | INTRAMUSCULAR | Status: AC
  Administered 2021-10-30: 40 mg via INTRAVENOUS
  Filled 2021-10-30: qty 4

## 2021-10-30 MED ORDER — IPRATROPIUM-ALBUTEROL 0.5-2.5 (3) MG/3ML IN SOLN
3.0000 mL | Freq: Four times a day (QID) | RESPIRATORY_TRACT | Status: DC
  Administered 2021-10-30 (×4): 3 mL via RESPIRATORY_TRACT
  Filled 2021-10-30 (×4): qty 3

## 2021-10-30 MED ORDER — ACETAMINOPHEN 650 MG RE SUPP: 650.0000 mg | Freq: Four times a day (QID) | RECTAL | Status: DC | PRN

## 2021-10-30 MED ORDER — POTASSIUM CHLORIDE CRYS ER 20 MEQ PO TBCR
40.0000 meq | EXTENDED_RELEASE_TABLET | Freq: Once | ORAL | Status: DC
  Filled 2021-10-30: qty 2

## 2021-10-30 MED ORDER — DIAZEPAM 5 MG PO TABS
5.0000 mg | ORAL_TABLET | Freq: Once | ORAL | Status: AC
  Administered 2021-10-30: 5 mg via ORAL
  Filled 2021-10-30: qty 1

## 2021-10-30 MED ORDER — POTASSIUM CHLORIDE 20 MEQ PO PACK
40.0000 meq | PACK | Freq: Once | ORAL | Status: AC
  Administered 2021-10-30: 40 meq via ORAL
  Filled 2021-10-30: qty 2

## 2021-10-30 MED ORDER — AZITHROMYCIN 250 MG PO TABS
250.0000 mg | ORAL_TABLET | Freq: Every day | ORAL | Status: DC
  Administered 2021-10-30 – 2021-11-03 (×5): 250 mg via ORAL
  Filled 2021-10-30 (×6): qty 1

## 2021-10-30 MED ORDER — METHYLPREDNISOLONE SODIUM SUCC 125 MG IJ SOLR
120.0000 mg | INTRAMUSCULAR | Status: DC
  Administered 2021-10-30: 120 mg via INTRAVENOUS
  Filled 2021-10-30: qty 2

## 2021-10-30 MED ORDER — ALBUTEROL SULFATE (2.5 MG/3ML) 0.083% IN NEBU: 2.5000 mg | INHALATION_SOLUTION | RESPIRATORY_TRACT | Status: DC | PRN

## 2021-10-30 MED ORDER — IPRATROPIUM-ALBUTEROL 0.5-2.5 (3) MG/3ML IN SOLN
3.0000 mL | Freq: Three times a day (TID) | RESPIRATORY_TRACT | Status: DC
  Administered 2021-10-31 – 2021-11-02 (×7): 3 mL via RESPIRATORY_TRACT
  Filled 2021-10-30 (×7): qty 3

## 2021-10-30 MED ORDER — FUROSEMIDE 10 MG/ML IJ SOLN
40.0000 mg | Freq: Every day | INTRAMUSCULAR | Status: DC
  Administered 2021-10-30 – 2021-10-31 (×2): 40 mg via INTRAVENOUS
  Filled 2021-10-30 (×2): qty 4

## 2021-10-30 MED ORDER — ACETAMINOPHEN 325 MG PO TABS
650.0000 mg | ORAL_TABLET | Freq: Four times a day (QID) | ORAL | Status: DC | PRN
  Administered 2021-10-31: 650 mg via ORAL
  Filled 2021-10-30: qty 2

## 2021-10-30 MED ORDER — METOPROLOL SUCCINATE ER 25 MG PO TB24
25.0000 mg | ORAL_TABLET | Freq: Every day | ORAL | Status: DC
  Administered 2021-10-30 – 2021-11-03 (×5): 25 mg via ORAL
  Filled 2021-10-30 (×5): qty 1

## 2021-10-30 MED ORDER — BUDESONIDE 0.25 MG/2ML IN SUSP
0.2500 mg | Freq: Two times a day (BID) | RESPIRATORY_TRACT | Status: DC
  Administered 2021-10-30 – 2021-11-03 (×9): 0.25 mg via RESPIRATORY_TRACT
  Filled 2021-10-30 (×9): qty 2

## 2021-10-30 MED ORDER — METHYLPREDNISOLONE SODIUM SUCC 40 MG IJ SOLR
40.0000 mg | Freq: Two times a day (BID) | INTRAMUSCULAR | Status: DC
  Administered 2021-10-30 – 2021-10-31 (×2): 40 mg via INTRAVENOUS
  Filled 2021-10-30 (×2): qty 1

## 2021-10-30 MED ORDER — ONDANSETRON HCL 4 MG/2ML IJ SOLN
4.0000 mg | Freq: Four times a day (QID) | INTRAMUSCULAR | Status: DC | PRN
  Administered 2021-11-03: 4 mg via INTRAVENOUS
  Filled 2021-10-30: qty 2

## 2021-10-30 NOTE — Assessment & Plan Note (Addendum)
-   Troponin minimally elevated in the setting of CHF and COPD exacerbation, no symptoms of ACS -EKG without acute findings -Continue beta-blocker, statin

## 2021-10-30 NOTE — Assessment & Plan Note (Addendum)
Lipase and LFTs normal.   -Resolved

## 2021-10-30 NOTE — Assessment & Plan Note (Addendum)
Acute COPD exacerbation Acute on chronic diastolic CHF S/p MVR -Clinically improving, primarily suspect COPD exacerbation with mild component of diastolic CHF -Cut down steroids, transitioned to prednisone taper, continue nebs and azithromycin  -Volume status considerably better, 2 L negative for the transition back to torsemide 40 Mg twice daily her home dose -2D echo noted preserved EF, diastolic dysfunction, moderate PAH, normal-appearing prosthetic MV -Weaned off O2, continue Mucinex, Best boy

## 2021-10-30 NOTE — Assessment & Plan Note (Addendum)
-   Restart Toprol

## 2021-10-30 NOTE — Progress Notes (Signed)
ANTICOAGULATION CONSULT NOTE - Follow Up Consult  Pharmacy Consult for Warfarin Indication: atrial fibrillation  Allergies  Allergen Reactions   Aldactone [Spironolactone] Other (See Comments)    dyspnea   Amoxicillin Palpitations    Tachycardia Has patient had a PCN reaction causing immediate rash, facial/tongue/throat swelling, SOB or lightheadedness with hypotension: no Has patient had a PCN reaction causing severe rash involving mucus membranes or skin necrosis: {no Has patient had a PCN reaction that required hospitalization {no Has patient had a PCN reaction occurring within the last 10 years: {yes If all of the above answers are "NO", then may proceed with Cephalosporin use.   Diltiazem Other (See Comments)    Causing headaches    Flagyl [Metronidazole Hcl] Other (See Comments)    Causing headaches     Flovent [Fluticasone Propionate] Other (See Comments)    Leg cramps   Gabapentin Swelling   Lyrica [Pregabalin] Swelling   Quinidine Diarrhea and Other (See Comments)    Fever diarrhea   Simvastatin Other (See Comments)    Leg pain, myalgia   Tramadol Nausea Only   Verapamil Other (See Comments)    myalgias   Amlodipine     Low extremity edema   Crestor [Rosuvastatin]     myalgia   Livalo [Pitavastatin]     myalgias   Zetia [Ezetimibe]     LEG CRAMPS    Ace Inhibitors Other (See Comments)    unknown   Benazepril Hcl Cough   Ciprocin-Fluocin-Procin [Fluocinolone Acetonide] Other (See Comments)    unknown   Ciprofloxacin Diarrhea   Codeine Nausea Only   Nitrofurantoin Monohyd Macro Nausea Only    Patient Measurements: Height: 5\' 3"  (160 cm) Weight: 67.4 kg (148 lb 8 oz) IBW/kg (Calculated) : 52.4  Vital Signs: Temp: 98.8 F (37.1 C) (02/03 0042) Temp Source: Oral (02/03 0042) BP: 130/56 (02/03 0042) Pulse Rate: 90 (02/03 0042)  Labs: Recent Labs    10/29/21 1920 10/29/21 2105 10/29/21 2317  HGB 11.5*  --   --   HCT 34.9*  --   --   PLT 150   --   --   LABPROT  --   --  35.1*  INR  --   --  3.5*  CREATININE 0.89  --   --   TROPONINIHS 26* 23*  --     Estimated Creatinine Clearance: 43.4 mL/min (by C-G formula based on SCr of 0.89 mg/dL).  Assessment: 87 year old female on warfarin PTA for afib for SOB INR on admission 3.5 Dose prior to admission 1 mg Wed, Sat., 2 mg all other days  Goal of Therapy:  INR 2-3 Monitor platelets by anticoagulation protocol: Yes   Plan:  No warfarin today Daily INR  Thank you Anette Guarneri, PharmD 10/30/2021,2:54 AM

## 2021-10-30 NOTE — H&P (Signed)
History and Physical    Patient: Catherine [name] DOB: 1935/05/12 DOA: 10/29/2021 DOS: the patient was seen and examined on 10/30/2021 PCP: [name], MD  Patient coming from: Home  Chief Complaint:  Chief Complaint  Patient presents with   Shortness of Breath    HPI: Catherine [name] is a 87 y.o. female with medical history significant of chronic A. 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  At that time, WBC 11.9, BNP 250, COVID and flu negative, troponin negative x2, EKG without acute ischemia, and chest x-ray negative for acute finding.  She was discharged with plan to treat with over-the-counter cold and flu medications and follow-up with PCP.  Patient returned to the ED yesterday due to persistent symptoms.  In the ED, patient was not hypoxic at rest but desatted to 84% on room air with ambulation.  Not febrile.  Labs showing WBC 7.8, hemoglobin 11.5, platelet count 150k.  Sodium 143, potassium 3.6, chloride 104, bicarb 30, BUN 14, creatinine 0.8, glucose 107.  Lipase and LFTs normal.  UA pending.  EKG without acute ischemic changes.  High-sensitivity troponin mildly elevated but stable (26 >23).  D-dimer 1.14.  BNP 234, stable since labs done 5 days ago.  COVID and flu negative.  INR 3.5.  CT angiogram chest negative for PE.  Showing right middle lobe atelectatic changes and small pleural effusions. Patient was given cephalexin, doxycycline, and Zofran.  Patient states she has been "feeling bad" for the past 3 weeks.  A week ago she had a temperature of 103 F and was seen in the emergency room.  Also reports increasing shortness of breath this past week.   She has felt nauseous but not vomited.  Denies abdominal pain.  Denies chest pain.  She had COVID infection in December 2019 but received vaccine x3 since then.  States her legs were swollen and her doctor increased the dose of torsemide for 3 days after which the swelling improved.  Her breathing usually improves after she uses her home COPD inhalers.  Review of Systems: As mentioned in the history of present illness. All other systems reviewed and are negative. Past Medical History:  Diagnosis Date   Acute on chronic diastolic heart failure (HCC)    Aortic stenosis, mild 07/26/2017   Arthritis    Asthma    last attack 02/2015   Atrial fibrillation, chronic (HCC)    Cellulitis of left lower extremity 08/17/2016   Ulcer associated with severe venous insufficiency   Chronic anticoagulation    Chronic diastolic CHF (congestive heart failure) ([place])    Chronic kidney disease    "RIGHT MANY KIDNEY INFECTIONS AND STONES"   COPD (chronic obstructive pulmonary disease) (HCC)    Coronary artery disease    Dizziness    H/O: rheumatic fever    Heart murmur    Hypertension    PONV (postoperative nausea and vomiting)    ' SOMETIMES', BUT NOT ALWAYS"   S/P Maze operation for atrial fibrillation 10/14/2016   Complete bilateral atrial lesion set using cryothermy and bipolar radiofrequency ablation - atrial appendage was not treated due to previous surgical procedure (open mitral commissurotomy)   S/P mitral valve replacement with bioprosthetic  valve 10/14/2016   29 mm Medtronic Mosaic porcine bioprosthetic tissue valve   Tricuspid regurgitation 07/14/2021   UTI (urinary tract infection)    Valvular heart disease    Has mitral stenosis with prior mitral commissurotomy in 1970   Past Surgical History:  Procedure Laterality Date   ABDOMINAL HYSTERECTOMY  1983   endometriosis   APPENDECTOMY     BACK SURGERY     neurosurgery x2   CARDIAC CATHETERIZATION     CARDIAC CATHETERIZATION N/A 08/03/2016    Procedure: Right/Left Heart Cath and Coronary Angiography;  Surgeon: Peter M Martinique, MD;  Location: [facility];  Service: Cardiovascular;  Laterality: N/A;   cataract surg     CHOLECYSTECTOMY N/A 05/30/2015   Procedure: LAPAROSCOPIC CHOLECYSTECTOMY WITH INTRAOPERATIVE CHOLANGIOGRAM;  Surgeon: Excell Seltzer, MD;  Location: [facility];  Service: General;  Laterality: N/A;   COLONOSCOPY     CORONARY ARTERY BYPASS GRAFT N/A 10/14/2016   Procedure: CORONARY ARTERY BYPASS GRAFTING (CABG);  Surgeon: Rexene Alberts, MD;  Location: [facility];  Service: Open Heart Surgery;  Laterality: N/A;   EYE SURGERY     MAZE N/A 10/14/2016   Procedure: MAZE;  Surgeon: Rexene Alberts, MD;  Location: [facility];  Service: Open Heart Surgery;  Laterality: N/A;   MITRAL VALVE REPLACEMENT N/A 10/14/2016   Procedure: REDO MITRAL VALVE REPLACEMENT (MVR);  Surgeon: Rexene Alberts, MD;  Location: [facility];  Service: Open Heart Surgery;  Laterality: N/A;   MITRAL VALVE SURGERY Left 1970   Open mitral commissurotomy via left thoracotomy approach   TEE WITHOUT CARDIOVERSION N/A 07/05/2016   Procedure: TRANSESOPHAGEAL ECHOCARDIOGRAM (TEE);  Surgeon: [name], MD;  Location: [facility];  Service: Cardiovascular;  Laterality: N/A;   TEE WITHOUT CARDIOVERSION N/A 10/14/2016   Procedure: TRANSESOPHAGEAL ECHOCARDIOGRAM (TEE);  Surgeon: Rexene Alberts, MD;  Location: [facility];  Service: Open Heart Surgery;  Laterality: N/A;   Social History:  reports that she quit smoking about 46 years ago. Her smoking use included cigarettes. She has a 15.00 pack-year smoking history. She has never used smokeless tobacco. She reports that she does not drink alcohol and does not use drugs.  Allergies  Allergen Reactions   Aldactone [Spironolactone] Other (See Comments)    dyspnea   Amoxicillin Palpitations    Tachycardia Has patient had a PCN reaction causing immediate rash, facial/tongue/throat swelling, SOB or lightheadedness with hypotension:  no Has patient had a PCN reaction causing severe rash involving mucus membranes or skin necrosis: {no Has patient had a PCN reaction that required hospitalization {no Has patient had a PCN reaction occurring within the last 10 years: {yes If all of the above answers are "NO", then may proceed with Cephalosporin use.   Diltiazem Other (See Comments)    Causing headaches    Flagyl [Metronidazole Hcl] Other (See Comments)    Causing headaches     Flovent [Fluticasone Propionate] Other (See Comments)    Leg cramps   Gabapentin Swelling   Lyrica [Pregabalin] Swelling   Quinidine Diarrhea and Other (See Comments)    Fever diarrhea   Simvastatin Other (See Comments)    Leg pain, myalgia   Tramadol Nausea Only   Verapamil Other (See Comments)    myalgias   Amlodipine     Low extremity edema   Crestor [Rosuvastatin]     myalgia   Livalo [Pitavastatin]     myalgias   Zetia [Ezetimibe]     LEG CRAMPS    Ace Inhibitors  Other (See Comments)    unknown   Benazepril Hcl Cough   Ciprocin-Fluocin-Procin [Fluocinolone Acetonide] Other (See Comments)    unknown   Ciprofloxacin Diarrhea   Codeine Nausea Only   Nitrofurantoin Monohyd Macro Nausea Only    Family History  Problem Relation Age of Onset   Leukemia Father    Breast cancer Neg Hx     Prior to Admission medications   Medication Sig Start Date End Date Taking? Authorizing Provider  warfarin (COUMADIN) 2 MG tablet TAKE ONE TABLET DAILY BY MOUTH EXCEPT TAKE 1/2 TABLET ON WEDNESDAYS AND SATURDAYS OR AS DIRECTED BY ANTICOAGULATION CLINIC 09/29/21   Burchette, Alinda Sierras, MD  acetaminophen (TYLENOL) 500 MG tablet Take 500 mg by mouth daily.    [provider]  albuterol (VENTOLIN HFA) 108 (90 Base) MCG/ACT inhaler Inhale 2 puffs into the lungs every 4 (four) hours as needed for wheezing or shortness of breath. 07/09/20   Tanda Rockers, MD  cephALEXin (KEFLEX) 250 MG capsule TAKE ONE CAPSULE ONCE DAILY 07/15/21   Burchette,  Alinda Sierras, MD  cholecalciferol (VITAMIN D3) 25 MCG (1000 UNIT) tablet Take 1 tablet (1,000 Units total) by mouth daily. 01/23/21   [name], MD  diazepam (VALIUM) 5 MG tablet TAKE ONE TABLET AT BEDTIME 05/11/21   Burchette, Alinda Sierras, MD  doxazosin (CARDURA) 8 MG tablet TAKE 1 TABLET DAILY 07/20/21   [name], MD  metoprolol succinate (TOPROL-XL) 25 MG 24 hr tablet TAKE 1 TABLET DAILY 07/07/21   Burchette, Alinda Sierras, MD  Multiple Vitamins-Minerals (HAIR/SKIN/NAILS/BIOTIN PO) Take 1 tablet by mouth daily.    [provider]  Multiple Vitamins-Minerals (MULTIVITAMIN ADULT EXTRA C) CHEW Chew 2 tablets by mouth daily.    [provider]  ondansetron (ZOFRAN) 4 MG tablet Take 1 tablet (4 mg total) by mouth every 8 (eight) hours as needed for nausea or vomiting. 10/27/21   Burchette, Alinda Sierras, MD  potassium chloride (KLOR-CON) 10 MEQ tablet TAKE UP TO 8 TABLETS DAILY AS DIRECTED 07/14/21   [name], MD  rosuvastatin (CRESTOR) 5 MG tablet Take one tablet every Monday, wednesday Friday and Sunday 03/20/21   [name], MD  [facility] 80-4.5 MCG/ACT inhaler INHALE 2 PUFFS TWICE DAILY 09/29/21   Tanda Rockers, MD  torsemide (DEMADEX) 20 MG tablet Take 2 tablets (40mg  total) twice daily. You may take an extra tablet for a total dose of 3 tablets (60mg  total) twice daily as needed for weight gain or swelling. 05/22/21   Loel Dubonnet, NP  triamcinolone cream (KENALOG) 0.1 % Apply 1 application topically 2 (two) times daily. 07/14/21   [name], MD    Physical Exam: Vitals:   10/29/21 2125 10/29/21 2230 10/30/21 0042 10/30/21 0320  BP: (!) 157/53 (!) 152/65 (!) 130/56 (!) 133/54  Pulse: (!) 58 60 90 63  Resp: 14 15 16 17   Temp:   98.8 F (37.1 C) 98.4 F (36.9 C)  TempSrc:   Oral Oral  SpO2: 93% 92% 91% 95%  Weight:   67.4 kg   Height:   5\' 3"  (1.6 m)    Physical Exam Constitutional:      General: She is not in acute distress. HENT:     Head:  Normocephalic and atraumatic.  Eyes:     Extraocular Movements: Extraocular movements intact.     Conjunctiva/sclera: Conjunctivae normal.  Cardiovascular:     Rate and Rhythm: Normal rate and regular rhythm.     Pulses: Normal pulses.  Pulmonary:     Effort: Pulmonary effort is normal. No respiratory distress.     Breath sounds: No rales.     Comments: Diminished breath sounds bilaterally with mild end expiratory wheezing Abdominal:     General: Bowel sounds are normal. There is no distension.     Palpations: Abdomen is soft.     Tenderness: There is no abdominal tenderness.  Musculoskeletal:        General: No swelling or tenderness.     Cervical back: Normal range of motion and neck supple.  Skin:    General: Skin is warm and dry.  Neurological:     General: No focal deficit present.     Mental Status: She is alert and oriented to person, place, and time.     Data Reviewed:  Labs and imaging reviewed (see HPI).  EKG: Independently reviewed.  Junctional rhythm.  When compared to prior tracings, no acute ischemic changes.  Assessment and Plan: * Acute respiratory failure with hypoxia ([place])- (present on admission) Acute COPD exacerbation Acute on chronic diastolic CHF Patient not hypoxic at rest but desatted to 84% on room air with ambulation in the ED.  No fever or leukocytosis.  COVID and flu negative.  On exam, has diminished breath sounds bilaterally with mild end expiratory wheezing.  CT angiogram chest negative for PE.  Showing right middle lobe atelectatic changes and small pleural effusions. BNP 234, stable since labs done 5 days ago. Echo done in October 2022 showing normal systolic function and bioprosthetic mitral valve functioning appropriately. ACS less likely as EKG without acute ischemic changes and high-sensitivity troponin mildly elevated but stable (26 >23). -IV Lasix 40 mg x 1 ordered, please reassess in the morning and order additional doses as  needed. -Monitor intake and output, daily weights, low-sodium diet with fluid restriction -Solu-Medrol 60 mg every 12 hours, DuoNeb every 6 hours, Pulmicort neb twice daily, albuterol neb every 2 hours as needed -Incentive spirometry -Continuous pulse ox, supplemental oxygen as needed  Nausea without vomiting Lipase and LFTs normal.  Abdominal exam benign. -UA pending. Antiemetic as needed.  Coronary artery disease- (present on admission) Troponin mildly elevated but stable.  Patient is not endorsing any chest pain. -Resume home meds after pharmacy med rec is done.  Essential hypertension- (present on admission) Stable. -Pharmacy med rec pending.  Atrial fibrillation ([place])- (present on admission) INR currently supratherapeutic at 3.5. -Coumadin dosing per pharmacy   Advance Care Planning:   Code Status: Full Code.  Discussed with the patient.  Consults: None  Family Communication: No family available at this time.  Severity of Illness: The appropriate patient status for this patient is OBSERVATION. Observation status is judged to be reasonable and necessary in order to provide the required intensity of service to ensure the patient's safety. The patient's presenting symptoms, physical exam findings, and initial radiographic and laboratory data in the context of their medical condition is felt to place them at decreased risk for further clinical deterioration. Furthermore, it is anticipated that the patient will be medically stable for discharge from the hospital within 2 midnights of admission.   Author: Shela Leff, MD 10/30/2021 3:27 AM  For on call review [email or web address].

## 2021-10-30 NOTE — Progress Notes (Signed)
PROGRESS NOTE    JAHMIYAH DULLEA  AUQ:[id number] DOB: September 10, 1935 DOA: 10/29/2021 PCP: [name], MD  Brief Narrative: 86/F with history of COPD, chronic diastolic CHF, rheumatic heart disease status post bioprosthetic MVR in 2018, moderate to severe TR, CAD/CABG, CKD 3 AAA presented to the ED with shortness of breath.  recent ER visits with cough fever and URI symptoms. In the ED was hypoxic to 84% with activity, BNP was 234, CT chest noted right middle lobe atelectasis and small pleural effusions   Subjective: -Feels better, breathing is improving, not back to baseline yet  Assessment and Plan: * Acute respiratory failure with hypoxia (HCC)- (present on admission) Acute COPD exacerbation Acute on chronic diastolic CHF S/p MVR -Clinically improving, primarily suspect COPD exacerbation with mild component of diastolic CHF -Continue IV steroids today, fluid COVID PCR are negative -Continue IV Solu-Medrol, duo nebs, add azithromycin -Continue IV Lasix 1 more day -Wean O2 as tolerated -update echo  Atrial fibrillation ([place])- (present on admission) - Continue Coumadin per pharmacy -Restart Toprol  Coronary artery disease- (present on admission) - Troponin minimally elevated in the setting of CHF and COPD exacerbation, no symptoms of ACS -EKG without acute findings -Continue beta-blocker, statin  Nausea without vomiting Lipase and LFTs normal.   -Resolved  Essential hypertension- (present on admission) - Restart Toprol   DVT prophylaxis: Warfarin Code Status: Full code Family Communication: No family at bedside Disposition Plan: Home in 1 to 2 days   Consultants:    Procedures:   Antimicrobials:    Objective: Vitals:   10/29/21 2230 10/30/21 0042 10/30/21 0320 10/30/21 0729  BP: (!) 152/65 (!) 130/56 (!) 133/54 (!) 135/58  Pulse: 60 90 63 76  Resp: 15 16 17 17   Temp:  98.8 F (37.1 C) 98.4 F (36.9 C) 98.6 F (37 C)  TempSrc:  Oral Oral Oral  SpO2:  92% 91% 95% 94%  Weight:  67.4 kg    Height:  5\' 3"  (1.6 m)      Intake/Output Summary (Last 24 hours) at 10/30/2021 1011 Last data filed at 10/30/2021 6389 Gross per 24 hour  Intake 360 ml  Output 1350 ml  Net -990 ml   Filed Weights   10/29/21 1746 10/30/21 0042  Weight: 66.7 kg 67.4 kg    Examination:  General exam: Pleasant elderly female sitting up in bed, AAOx3, no distress HEENT: Positive JVD CVS: S1-S2, regular rate rhythm, systolic murmur Lungs: Poor air movement bilaterally, few basilar rales Abdomen: Soft, nontender, bowel sounds present extremities: No edema  Skin: No rashes Psychiatry:  Mood & affect appropriate.     Data Reviewed:   CBC: Recent Labs  Lab 10/25/21 1303 10/29/21 1920  WBC 11.9* 7.8  NEUTROABS 9.7* 5.8  HGB 12.9 11.5*  HCT 38.3 34.9*  MCV 91.4 92.8  PLT 134* 373   Basic Metabolic Panel: Recent Labs  Lab 10/25/21 1303 10/29/21 1920  NA 142 143  K 3.6 3.6  CL 103 104  CO2 30 30  GLUCOSE 105* 107*  BUN 14 14  CREATININE 0.97 0.89  CALCIUM 9.2 8.9   GFR: Estimated Creatinine Clearance: 43.4 mL/min (by C-G formula based on SCr of 0.89 mg/dL). Liver Function Tests: Recent Labs  Lab 10/25/21 1303 10/29/21 1920  AST 22 20  ALT 17 17  ALKPHOS 72 71  BILITOT 1.3* 0.9  PROT 6.8 6.7  ALBUMIN 4.0 3.7   Recent Labs  Lab 10/25/21 1303 10/29/21 1920  LIPASE 17 23  No results for input(s): AMMONIA in the last 168 hours. Coagulation Profile: Recent Labs  Lab 10/29/21 2317  INR 3.5*   Cardiac Enzymes: No results for input(s): CKTOTAL, CKMB, CKMBINDEX, TROPONINI in the last 168 hours. BNP (last 3 results) No results for input(s): PROBNP in the last 8760 hours. HbA1C: No results for input(s): HGBA1C in the last 72 hours. CBG: No results for input(s): GLUCAP in the last 168 hours. Lipid Profile: No results for input(s): CHOL, HDL, LDLCALC, TRIG, CHOLHDL, LDLDIRECT in the last 72 hours. Thyroid Function Tests: No  results for input(s): TSH, T4TOTAL, FREET4, T3FREE, THYROIDAB in the last 72 hours. Anemia Panel: No results for input(s): VITAMINB12, FOLATE, FERRITIN, TIBC, IRON, RETICCTPCT in the last 72 hours. Urine analysis:    Component Value Date/Time   COLORURINE YELLOW 10/30/2021 0327   APPEARANCEUR CLEAR 10/30/2021 0327   LABSPEC <1.005 (L) 10/30/2021 0327   PHURINE 7.0 10/30/2021 0327   GLUCOSEU NEGATIVE 10/30/2021 0327   HGBUR NEGATIVE 10/30/2021 0327   BILIRUBINUR NEGATIVE 10/30/2021 0327   BILIRUBINUR negative 09/09/2020 1129   KETONESUR NEGATIVE 10/30/2021 0327   PROTEINUR NEGATIVE 10/30/2021 0327   UROBILINOGEN 0.2 09/09/2020 1129   UROBILINOGEN 0.2 03/15/2015 2151   NITRITE NEGATIVE 10/30/2021 0327   LEUKOCYTESUR NEGATIVE 10/30/2021 0327   Sepsis Labs: @LABRCNTIP (procalcitonin:4,lacticidven:4)  ) Recent Results (from the past 240 hour(s))  Resp Panel by RT-PCR (Flu A&B, Covid) Nasopharyngeal Swab     Status: None   Collection Time: 10/25/21  3:07 PM   Specimen: Nasopharyngeal Swab; Nasopharyngeal(NP) swabs in vial transport medium  Result Value Ref Range Status   SARS Coronavirus 2 by RT PCR NEGATIVE NEGATIVE Final    Comment: (NOTE) SARS-CoV-2 target nucleic acids are NOT DETECTED.  The SARS-CoV-2 RNA is generally detectable in upper respiratory specimens during the acute phase of infection. The lowest concentration of SARS-CoV-2 viral copies this assay can detect is 138 copies/mL. A negative result does not preclude SARS-Cov-2 infection and should not be used as the sole basis for treatment or other patient management decisions. A negative result may occur with  improper specimen collection/handling, submission of specimen other than nasopharyngeal swab, presence of viral mutation(s) within the areas targeted by this assay, and inadequate number of viral copies(<138 copies/mL). A negative result must be combined with clinical observations, patient history, and  epidemiological information. The expected result is Negative.  Fact Sheet for Patients:  EntrepreneurPulse.com.au  Fact Sheet for Healthcare Providers:  IncredibleEmployment.be  This test is no t yet approved or cleared by the [place] FDA and  has been authorized for detection and/or diagnosis of SARS-CoV-2 by FDA under an Emergency Use Authorization (EUA). This EUA will remain  in effect (meaning this test can be used) for the duration of the COVID-19 declaration under Section 564(b)(1) of the Act, 21 U.S.C.section 360bbb-3(b)(1), unless the authorization is terminated  or revoked sooner.       Influenza A by PCR NEGATIVE NEGATIVE Final   Influenza B by PCR NEGATIVE NEGATIVE Final    Comment: (NOTE) The Xpert Xpress SARS-CoV-2/FLU/RSV plus assay is intended as an aid in the diagnosis of influenza from Nasopharyngeal swab specimens and should not be used as a sole basis for treatment. Nasal washings and aspirates are unacceptable for Xpert Xpress SARS-CoV-2/FLU/RSV testing.  Fact Sheet for Patients: EntrepreneurPulse.com.au  Fact Sheet for Healthcare Providers: IncredibleEmployment.be  This test is not yet approved or cleared by the [place] FDA and has been authorized for detection and/or diagnosis of SARS-CoV-2  by FDA under an Emergency Use Authorization (EUA). This EUA will remain in effect (meaning this test can be used) for the duration of the COVID-19 declaration under Section 564(b)(1) of the Act, 21 U.S.C. section 360bbb-3(b)(1), unless the authorization is terminated or revoked.  Performed at [facility], [address] [place], [place] [address]   Resp Panel by RT-PCR (Flu A&B, Covid) Nasopharyngeal Swab     Status: None   Collection Time: 10/29/21  7:30 PM   Specimen: Nasopharyngeal Swab; Nasopharyngeal(NP) swabs in vial transport medium  Result Value Ref  Range Status   SARS Coronavirus 2 by RT PCR NEGATIVE NEGATIVE Final    Comment: (NOTE) SARS-CoV-2 target nucleic acids are NOT DETECTED.  The SARS-CoV-2 RNA is generally detectable in upper respiratory specimens during the acute phase of infection. The lowest concentration of SARS-CoV-2 viral copies this assay can detect is 138 copies/mL. A negative result does not preclude SARS-Cov-2 infection and should not be used as the sole basis for treatment or other patient management decisions. A negative result may occur with  improper specimen collection/handling, submission of specimen other than nasopharyngeal swab, presence of viral mutation(s) within the areas targeted by this assay, and inadequate number of viral copies(<138 copies/mL). A negative result must be combined with clinical observations, patient history, and epidemiological information. The expected result is Negative.  Fact Sheet for Patients:  EntrepreneurPulse.com.au  Fact Sheet for Healthcare Providers:  IncredibleEmployment.be  This test is no t yet approved or cleared by the [place] FDA and  has been authorized for detection and/or diagnosis of SARS-CoV-2 by FDA under an Emergency Use Authorization (EUA). This EUA will remain  in effect (meaning this test can be used) for the duration of the COVID-19 declaration under Section 564(b)(1) of the Act, 21 U.S.C.section 360bbb-3(b)(1), unless the authorization is terminated  or revoked sooner.       Influenza A by PCR NEGATIVE NEGATIVE Final   Influenza B by PCR NEGATIVE NEGATIVE Final    Comment: (NOTE) The Xpert Xpress SARS-CoV-2/FLU/RSV plus assay is intended as an aid in the diagnosis of influenza from Nasopharyngeal swab specimens and should not be used as a sole basis for treatment. Nasal washings and aspirates are unacceptable for Xpert Xpress SARS-CoV-2/FLU/RSV testing.  Fact Sheet for  Patients: EntrepreneurPulse.com.au  Fact Sheet for Healthcare Providers: IncredibleEmployment.be  This test is not yet approved or cleared by the [place] FDA and has been authorized for detection and/or diagnosis of SARS-CoV-2 by FDA under an Emergency Use Authorization (EUA). This EUA will remain in effect (meaning this test can be used) for the duration of the COVID-19 declaration under Section 564(b)(1) of the Act, 21 U.S.C. section 360bbb-3(b)(1), unless the authorization is terminated or revoked.  Performed at [facility], [address] [place], [place] [address]      Radiology Studies: DG Chest 2 View  Result Date: 10/29/2021 CLINICAL DATA:  Dyspnea, fever, nausea EXAM: CHEST - 2 VIEW COMPARISON:  10/25/2021 FINDINGS: Lungs are clear. No pneumothorax or pleural effusion. Coronary artery bypass grafting has been performed. Cardiac size within normal limits. Pulmonary vascularity is normal. No acute bone abnormality. IMPRESSION: No active cardiopulmonary disease. Electronically Signed   By: Fidela Salisbury M.D.   On: 10/29/2021 18:17   CT Angio Chest PE W and/or Wo Contrast  Result Date: 10/29/2021 CLINICAL DATA:  Fever and shortness of breath EXAM: CT ANGIOGRAPHY CHEST WITH CONTRAST TECHNIQUE: Multidetector CT imaging of the chest was performed using the standard  protocol during bolus administration of intravenous contrast. Multiplanar CT image reconstructions and MIPs were obtained to evaluate the vascular anatomy. RADIATION DOSE REDUCTION: This exam was performed according to the departmental dose-optimization program which includes automated exposure control, adjustment of the mA and/or kV according to patient size and/or use of iterative reconstruction technique. CONTRAST:  83 mL OMNIPAQUE IOHEXOL 350 MG/ML SOLN COMPARISON:  Chest x-ray from earlier in the same day. FINDINGS: Cardiovascular: Atherosclerotic calcifications  of the thoracic aorta are noted. Changes consistent with prior coronary bypass grafting are noted. No aneurysmal dilatation is seen. Heart is at the upper limits of normal in size. The pulmonary artery shows a normal branching pattern. No intraluminal filling defect to suggest pulmonary embolism is seen. Mild coronary calcifications are noted. Mediastinum/Nodes: Thoracic inlet is within normal limits. No sizable hilar or mediastinal adenopathy is noted. The esophagus as visualized is within normal limits. Lungs/Pleura: Mild right middle lobe atelectatic changes are noted. Small pleural effusions are noted bilaterally. No sizable parenchymal nodules are seen. No pneumothorax is noted. Upper Abdomen: Visualized upper abdomen is within normal limits. Musculoskeletal: Postsurgical changes in the cervical spine are seen. No acute bony abnormality is noted. Review of the MIP images confirms the above findings. IMPRESSION: No evidence of pulmonary emboli. Right middle lobe atelectatic changes. Small pleural effusions are seen. Aortic Atherosclerosis (ICD10-I70.0). Electronically Signed   By: Inez Catalina M.D.   On: 10/29/2021 21:01     Scheduled Meds:  azithromycin  250 mg Oral Daily   budesonide (PULMICORT) nebulizer solution  0.25 mg Nebulization BID   furosemide  40 mg Intravenous Daily   ipratropium-albuterol  3 mL Nebulization Q6H   methylPREDNISolone (SOLU-MEDROL) injection  40 mg Intravenous Q12H   potassium chloride  40 mEq Oral Once   Continuous Infusions:   LOS: 0 days    Time spent: 61min  Domenic Polite, MD [facility]   10/30/2021, 10:11 AM

## 2021-10-30 NOTE — Progress Notes (Signed)
°  Echocardiogram 2D Echocardiogram has been performed.  Marybelle Killings 10/30/2021, 11:51 AM

## 2021-10-30 NOTE — Consult Note (Signed)
° °  [facility] Inpatient Consult   10/30/2021  Catherine [name] [birth year] [id number]  [employer] [ACO] Patient: [name]  Primary Care Provider:  [name], MD, [facility], Catherine [name] is an embedded provider with a Chronic Care Management team and program, and is listed for the transition of care follow up and appointments.  Patient was screened for Embedded practice service needs for chronic care management and is currently active in CCM with Embedded Pharmacist.  Plan: Notification sent to the [facility] and made aware of TOC needs for post hospital needs.  Please contact for further questions,  Natividad Brood, RN BSN [facility] Liaison  [phone] business mobile phone Toll free office [phone]  Fax number: [phone] [email or web address]

## 2021-10-30 NOTE — TOC Progression Note (Signed)
Transition of Care [facility]) - Progression Note    Patient Details  Name: Catherine [name] MRN: [id number] Date of Birth: 1935/02/01  [facility]) CM/SW Contact  Zenon Mayo, RN Phone Number: 10/30/2021, 10:50 AM  Clinical Narrative:     Transition of Care [facility]) Screening Note   Patient Details  Name: Catherine [name] Date of Birth: 05-28-1935   Transition of Care [facility]) CM/SW Contact:    Zenon Mayo, RN Phone Number: 10/30/2021, 10:50 AM    Transition of Care Department [facility]) has reviewed patient and no TOC needs have been identified at this time. We will continue to monitor patient advancement through interdisciplinary progression rounds. If new patient transition needs arise, please place a TOC consult.          Expected Discharge Plan and Services                                                 Social Determinants of Health (SDOH) Interventions    Readmission Risk Interventions No flowsheet data found.

## 2021-10-30 NOTE — Assessment & Plan Note (Addendum)
-   Continue Coumadin per pharmacy -Continue Toprol

## 2021-10-31 LAB — PROTIME-INR
INR: 3.1 — ABNORMAL HIGH (ref 0.8–1.2)
Prothrombin Time: 31.6 seconds — ABNORMAL HIGH (ref 11.4–15.2)

## 2021-10-31 LAB — CBC
HCT: 32.8 % — ABNORMAL LOW (ref 36.0–46.0)
Hemoglobin: 11.5 g/dL — ABNORMAL LOW (ref 12.0–15.0)
MCH: 31.9 pg (ref 26.0–34.0)
MCHC: 35.1 g/dL (ref 30.0–36.0)
MCV: 91.1 fL (ref 80.0–100.0)
Platelets: 157 10*3/uL (ref 150–400)
RBC: 3.6 MIL/uL — ABNORMAL LOW (ref 3.87–5.11)
RDW: 13.4 % (ref 11.5–15.5)
WBC: 9.3 10*3/uL (ref 4.0–10.5)
nRBC: 0 % (ref 0.0–0.2)

## 2021-10-31 LAB — BASIC METABOLIC PANEL
Anion gap: 10 (ref 5–15)
BUN: 14 mg/dL (ref 8–23)
CO2: 26 mmol/L (ref 22–32)
Calcium: 9.1 mg/dL (ref 8.9–10.3)
Chloride: 103 mmol/L (ref 98–111)
Creatinine, Ser: 0.91 mg/dL (ref 0.44–1.00)
GFR, Estimated: >60 mL/min (ref >60)
Glucose, Bld: 155 mg/dL — ABNORMAL HIGH (ref 70–99)
Potassium: 3.8 mmol/L (ref 3.5–5.1)
Sodium: 139 mmol/L (ref 135–145)

## 2021-10-31 MED ORDER — WARFARIN - PHARMACIST DOSING INPATIENT: Freq: Every day | Status: DC

## 2021-10-31 MED ORDER — DIAZEPAM 5 MG PO TABS
5.0000 mg | ORAL_TABLET | Freq: Once | ORAL | Status: AC
  Administered 2021-10-31: 5 mg via ORAL
  Filled 2021-10-31: qty 1

## 2021-10-31 MED ORDER — WARFARIN SODIUM 1 MG PO TABS
1.0000 mg | ORAL_TABLET | Freq: Once | ORAL | Status: AC
  Administered 2021-10-31: 1 mg via ORAL
  Filled 2021-10-31: qty 1

## 2021-10-31 MED ORDER — MENTHOL 3 MG MT LOZG
1.0000 | LOZENGE | OROMUCOSAL | Status: DC | PRN
  Filled 2021-10-31: qty 9

## 2021-10-31 MED ORDER — PREDNISONE 20 MG PO TABS
40.0000 mg | ORAL_TABLET | Freq: Every day | ORAL | Status: DC
  Administered 2021-11-01 – 2021-11-02 (×2): 40 mg via ORAL
  Filled 2021-10-31 (×2): qty 2

## 2021-10-31 MED ORDER — GUAIFENESIN ER 600 MG PO TB12
600.0000 mg | ORAL_TABLET | Freq: Two times a day (BID) | ORAL | Status: DC
  Administered 2021-10-31 – 2021-11-03 (×6): 600 mg via ORAL
  Filled 2021-10-31 (×6): qty 1

## 2021-10-31 MED ORDER — METHYLPREDNISOLONE SODIUM SUCC 40 MG IJ SOLR
40.0000 mg | Freq: Two times a day (BID) | INTRAMUSCULAR | Status: AC
  Administered 2021-10-31: 40 mg via INTRAVENOUS
  Filled 2021-10-31: qty 1

## 2021-10-31 MED ORDER — BENZONATATE 100 MG PO CAPS
200.0000 mg | ORAL_CAPSULE | Freq: Two times a day (BID) | ORAL | Status: DC
  Administered 2021-10-31 – 2021-11-03 (×6): 200 mg via ORAL
  Filled 2021-10-31 (×6): qty 2

## 2021-10-31 NOTE — Plan of Care (Signed)
°  Problem: Education: Goal: Knowledge of disease or condition will improve Outcome: Progressing   Problem: Respiratory: Goal: Ability to maintain a clear airway will improve Outcome: Progressing   Problem: Respiratory: Goal: Ability to maintain adequate ventilation will improve Outcome: Progressing

## 2021-10-31 NOTE — Progress Notes (Addendum)
ANTICOAGULATION CONSULT NOTE - Follow Up Consult  Pharmacy Consult for Warfarin Indication: atrial fibrillation   Allergies  Allergen Reactions   Aldactone [Spironolactone] Other (See Comments)    dyspnea   Amoxicillin Palpitations    Tachycardia Has patient had a PCN reaction causing immediate rash, facial/tongue/throat swelling, SOB or lightheadedness with hypotension: no Has patient had a PCN reaction causing severe rash involving mucus membranes or skin necrosis: {no Has patient had a PCN reaction that required hospitalization {no Has patient had a PCN reaction occurring within the last 10 years: {yes If all of the above answers are "NO", then may proceed with Cephalosporin use.   Diltiazem Other (See Comments)    Causing headaches    Flagyl [Metronidazole Hcl] Other (See Comments)    Causing headaches     Flovent [Fluticasone Propionate] Other (See Comments)    Leg cramps   Gabapentin Swelling   Lyrica [Pregabalin] Swelling   Quinidine Diarrhea and Other (See Comments)    Fever diarrhea   Simvastatin Other (See Comments)    Leg pain, myalgia   Tramadol Nausea Only   Verapamil Other (See Comments)    myalgias   Amlodipine     Low extremity edema   Crestor [Rosuvastatin]     myalgia   Livalo [Pitavastatin]     myalgias   Zetia [Ezetimibe]     LEG CRAMPS    Ace Inhibitors Other (See Comments)    unknown   Benazepril Hcl Cough   Ciprocin-Fluocin-Procin [Fluocinolone Acetonide] Other (See Comments)    unknown   Ciprofloxacin Diarrhea   Codeine Nausea Only   Nitrofurantoin Monohyd Macro Nausea Only    Patient Measurements: Height: 5\' 3"  (160 cm) Weight: 66.8 kg (147 lb 3.2 oz) IBW/kg (Calculated) : 52.4  Vital Signs: Temp: 97.6 F (36.4 C) (02/04 0408) Temp Source: Oral (02/04 0408) BP: 137/53 (02/04 0408) Pulse Rate: 56 (02/04 0408)  Labs: Recent Labs    10/29/21 1920 10/29/21 2105 10/29/21 2317 10/31/21 0320  HGB 11.5*  --   --  11.5*  HCT  34.9*  --   --  32.8*  PLT 150  --   --  157  LABPROT  --   --  35.1* 31.6*  INR  --   --  3.5* 3.1*  CREATININE 0.89  --   --  0.91  TROPONINIHS 26* 23*  --   --      Estimated Creatinine Clearance: 42.3 mL/min (by C-G formula based on SCr of 0.91 mg/dL).  Assessment: 87 year old female on warfarin PTA for afib for SOB. Patient has RHD with history of bio-MVR. Patient also taking azithromycin and prednisone, which can increase risk of bleeding. No bleeding noted. CBC WNL. INR 3.1, just above therapeutic range. Will cautiously redose today.  INR on admission 3.5 Dose prior to admission 1 mg Wed, Sat., 2 mg all other days  Goal of Therapy:  INR 2-3 Monitor platelets by anticoagulation protocol: Yes   Plan:  Resume warfarin today at 1 mg Daily INR  Thank you for allowing pharmacy to participate in this patient's care.  Reatha Harps, PharmD PGY1 Pharmacy Resident 10/31/2021 9:46 AM Check [email or web address] for unit specific pharmacy number

## 2021-10-31 NOTE — Progress Notes (Signed)
PROGRESS NOTE    ANEISA KARREN  UTM:[id number] DOB: November 12, 1934 DOA: 10/29/2021 PCP: [name], MD  Brief Narrative: 87/F with history of COPD, chronic diastolic CHF, rheumatic heart disease status post bioprosthetic MVR in 2018, moderate to severe TR, CAD/CABG, CKD 3 AAA presented to the ED with shortness of breath.  recent ER visits with cough fever and URI symptoms. In the ED was hypoxic to 85% with activity, BNP was 234, CT chest noted right middle lobe atelectasis and small pleural effusions  Subjective: -Feels much better overall, breathing improving, still bothered by cough, not back to baseline  Assessment and Plan: * Acute respiratory failure with hypoxia (HCC)- (present on admission) Acute COPD exacerbation Acute on chronic diastolic CHF S/p MVR -Clinically improving, primarily suspect COPD exacerbation with mild component of diastolic CHF -Cut down steroids, transition to oral prednisone tomorrow , continue nebs and azithromycin  -Continue IV Lasix 1 more day -Wean off O2, add Mucinex, Tessalon Perles -2D echo noted preserved EF, diastolic dysfunction, moderate PAH, normal-appearing prosthetic MV  Atrial fibrillation (HCC)- (present on admission) - Continue Coumadin per pharmacy -Continue Toprol  Coronary artery disease- (present on admission) - Troponin minimally elevated in the setting of CHF and COPD exacerbation, no symptoms of ACS -EKG without acute findings -Continue beta-blocker, statin  Nausea without vomiting Lipase and LFTs normal.   -Resolved  Essential hypertension- (present on admission) - Restart Toprol   DVT prophylaxis: Warfarin Code Status: Full code Family Communication: No family at bedside Disposition Plan: Home tomorrow if stable   Consultants:    Procedures:   Antimicrobials:    Objective: Vitals:   10/30/21 1952 10/30/21 1957 10/31/21 0408 10/31/21 0725  BP:   (!) 137/53   Pulse:   (!) 56   Resp:      Temp:   97.6  F (36.4 C)   TempSrc:   Oral   SpO2: 99% 96% 92% 98%  Weight:   66.8 kg   Height:        Intake/Output Summary (Last 24 hours) at 10/31/2021 1104 Last data filed at 10/31/2021 1017 Gross per 24 hour  Intake 418 ml  Output 750 ml  Net -332 ml   Filed Weights   10/29/21 1746 10/30/21 0042 10/31/21 0408  Weight: 66.7 kg 67.4 kg 66.8 kg    Examination:  General exam: Pleasant elderly female sitting up in bed, AAOx3, no distress HEENT: No JVD CVS: S1-S2, regular rhythm, systolic murmur Lungs: Poor air movement bilaterally, rare basilar rales Abdomen: Soft, nontender, bowel sounds present Extremities: No edema  Skin: No rashes Psychiatry:  Mood & affect appropriate.     Data Reviewed:   CBC: Recent Labs  Lab 10/25/21 1303 10/29/21 1920 10/31/21 0320  WBC 11.9* 7.8 9.3  NEUTROABS 9.7* 5.8  --   HGB 12.9 11.5* 11.5*  HCT 38.3 34.9* 32.8*  MCV 91.4 92.8 91.1  PLT 134* 150 568   Basic Metabolic Panel: Recent Labs  Lab 10/25/21 1303 10/29/21 1920 10/31/21 0320  NA 142 143 139  K 3.6 3.6 3.8  CL 103 104 103  CO2 30 30 26   GLUCOSE 105* 107* 155*  BUN 14 14 14   CREATININE 0.97 0.89 0.91  CALCIUM 9.2 8.9 9.1   GFR: Estimated Creatinine Clearance: 42.3 mL/min (by C-G formula based on SCr of 0.91 mg/dL). Liver Function Tests: Recent Labs  Lab 10/25/21 1303 10/29/21 1920  AST 22 20  ALT 17 17  ALKPHOS 72 71  BILITOT 1.3* 0.9  PROT 6.8 6.7  ALBUMIN 4.0 3.7   Recent Labs  Lab 10/25/21 1303 10/29/21 1920  LIPASE 17 23   No results for input(s): AMMONIA in the last 168 hours. Coagulation Profile: Recent Labs  Lab 10/29/21 2317 10/31/21 0320  INR 3.5* 3.1*   Cardiac Enzymes: No results for input(s): CKTOTAL, CKMB, CKMBINDEX, TROPONINI in the last 168 hours. BNP (last 3 results) No results for input(s): PROBNP in the last 8760 hours. HbA1C: No results for input(s): HGBA1C in the last 72 hours. CBG: No results for input(s): GLUCAP in the last 168  hours. Lipid Profile: No results for input(s): CHOL, HDL, LDLCALC, TRIG, CHOLHDL, LDLDIRECT in the last 72 hours. Thyroid Function Tests: No results for input(s): TSH, T4TOTAL, FREET4, T3FREE, THYROIDAB in the last 72 hours. Anemia Panel: No results for input(s): VITAMINB12, FOLATE, FERRITIN, TIBC, IRON, RETICCTPCT in the last 72 hours. Urine analysis:    Component Value Date/Time   COLORURINE YELLOW 10/30/2021 0327   APPEARANCEUR CLEAR 10/30/2021 0327   LABSPEC <1.005 (L) 10/30/2021 0327   PHURINE 7.0 10/30/2021 0327   GLUCOSEU NEGATIVE 10/30/2021 0327   HGBUR NEGATIVE 10/30/2021 0327   BILIRUBINUR NEGATIVE 10/30/2021 0327   BILIRUBINUR negative 09/09/2020 1129   KETONESUR NEGATIVE 10/30/2021 0327   PROTEINUR NEGATIVE 10/30/2021 0327   UROBILINOGEN 0.2 09/09/2020 1129   UROBILINOGEN 0.2 03/15/2015 2151   NITRITE NEGATIVE 10/30/2021 0327   LEUKOCYTESUR NEGATIVE 10/30/2021 0327   Sepsis Labs: @LABRCNTIP (procalcitonin:4,lacticidven:4)  ) Recent Results (from the past 240 hour(s))  Resp Panel by RT-PCR (Flu A&B, Covid) Nasopharyngeal Swab     Status: None   Collection Time: 10/25/21  3:07 PM   Specimen: Nasopharyngeal Swab; Nasopharyngeal(NP) swabs in vial transport medium  Result Value Ref Range Status   SARS Coronavirus 2 by RT PCR NEGATIVE NEGATIVE Final    Comment: (NOTE) SARS-CoV-2 target nucleic acids are NOT DETECTED.  The SARS-CoV-2 RNA is generally detectable in upper respiratory specimens during the acute phase of infection. The lowest concentration of SARS-CoV-2 viral copies this assay can detect is 138 copies/mL. A negative result does not preclude SARS-Cov-2 infection and should not be used as the sole basis for treatment or other patient management decisions. A negative result may occur with  improper specimen collection/handling, submission of specimen other than nasopharyngeal swab, presence of viral mutation(s) within the areas targeted by this assay, and  inadequate number of viral copies(<138 copies/mL). A negative result must be combined with clinical observations, patient history, and epidemiological information. The expected result is Negative.  Fact Sheet for Patients:  EntrepreneurPulse.com.au  Fact Sheet for Healthcare Providers:  IncredibleEmployment.be  This test is no t yet approved or cleared by the [place] FDA and  has been authorized for detection and/or diagnosis of SARS-CoV-2 by FDA under an Emergency Use Authorization (EUA). This EUA will remain  in effect (meaning this test can be used) for the duration of the COVID-19 declaration under Section 564(b)(1) of the Act, 21 U.S.C.section 360bbb-3(b)(1), unless the authorization is terminated  or revoked sooner.       Influenza A by PCR NEGATIVE NEGATIVE Final   Influenza B by PCR NEGATIVE NEGATIVE Final    Comment: (NOTE) The Xpert Xpress SARS-CoV-2/FLU/RSV plus assay is intended as an aid in the diagnosis of influenza from Nasopharyngeal swab specimens and should not be used as a sole basis for treatment. Nasal washings and aspirates are unacceptable for Xpert Xpress SARS-CoV-2/FLU/RSV testing.  Fact Sheet for Patients: EntrepreneurPulse.com.au  Fact Sheet for Healthcare  Providers: IncredibleEmployment.be  This test is not yet approved or cleared by the [place] and has been authorized for detection and/or diagnosis of SARS-CoV-2 by FDA under an Emergency Use Authorization (EUA). This EUA will remain in effect (meaning this test can be used) for the duration of the COVID-19 declaration under Section 564(b)(1) of the Act, 21 U.S.C. section 360bbb-3(b)(1), unless the authorization is terminated or revoked.  Performed at [facility], [address] [place], [place] [address]   Resp Panel by RT-PCR (Flu A&B, Covid) Nasopharyngeal Swab     Status: None    Collection Time: 10/29/21  7:30 PM   Specimen: Nasopharyngeal Swab; Nasopharyngeal(NP) swabs in vial transport medium  Result Value Ref Range Status   SARS Coronavirus 2 by RT PCR NEGATIVE NEGATIVE Final    Comment: (NOTE) SARS-CoV-2 target nucleic acids are NOT DETECTED.  The SARS-CoV-2 RNA is generally detectable in upper respiratory specimens during the acute phase of infection. The lowest concentration of SARS-CoV-2 viral copies this assay can detect is 138 copies/mL. A negative result does not preclude SARS-Cov-2 infection and should not be used as the sole basis for treatment or other patient management decisions. A negative result may occur with  improper specimen collection/handling, submission of specimen other than nasopharyngeal swab, presence of viral mutation(s) within the areas targeted by this assay, and inadequate number of viral copies(<138 copies/mL). A negative result must be combined with clinical observations, patient history, and epidemiological information. The expected result is Negative.  Fact Sheet for Patients:  EntrepreneurPulse.com.au  Fact Sheet for Healthcare Providers:  IncredibleEmployment.be  This test is no t yet approved or cleared by the [place] FDA and  has been authorized for detection and/or diagnosis of SARS-CoV-2 by FDA under an Emergency Use Authorization (EUA). This EUA will remain  in effect (meaning this test can be used) for the duration of the COVID-19 declaration under Section 564(b)(1) of the Act, 21 U.S.C.section 360bbb-3(b)(1), unless the authorization is terminated  or revoked sooner.       Influenza A by PCR NEGATIVE NEGATIVE Final   Influenza B by PCR NEGATIVE NEGATIVE Final    Comment: (NOTE) The Xpert Xpress SARS-CoV-2/FLU/RSV plus assay is intended as an aid in the diagnosis of influenza from Nasopharyngeal swab specimens and should not be used as a sole basis for treatment.  Nasal washings and aspirates are unacceptable for Xpert Xpress SARS-CoV-2/FLU/RSV testing.  Fact Sheet for Patients: EntrepreneurPulse.com.au  Fact Sheet for Healthcare Providers: IncredibleEmployment.be  This test is not yet approved or cleared by the [place] FDA and has been authorized for detection and/or diagnosis of SARS-CoV-2 by FDA under an Emergency Use Authorization (EUA). This EUA will remain in effect (meaning this test can be used) for the duration of the COVID-19 declaration under Section 564(b)(1) of the Act, 21 U.S.C. section 360bbb-3(b)(1), unless the authorization is terminated or revoked.  Performed at [facility], [address], [place], [place] [address]      Radiology Studies: DG Chest 2 View  Result Date: 10/29/2021 CLINICAL DATA:  Dyspnea, fever, nausea EXAM: CHEST - 2 VIEW COMPARISON:  10/25/2021 FINDINGS: Lungs are clear. No pneumothorax or pleural effusion. Coronary artery bypass grafting has been performed. Cardiac size within normal limits. Pulmonary vascularity is normal. No acute bone abnormality. IMPRESSION: No active cardiopulmonary disease. Electronically Signed   By: Fidela Salisbury M.D.   On: 10/29/2021 18:17   CT Angio Chest PE W and/or Wo Contrast  Result Date: 10/29/2021  CLINICAL DATA:  Fever and shortness of breath EXAM: CT ANGIOGRAPHY CHEST WITH CONTRAST TECHNIQUE: Multidetector CT imaging of the chest was performed using the standard protocol during bolus administration of intravenous contrast. Multiplanar CT image reconstructions and MIPs were obtained to evaluate the vascular anatomy. RADIATION DOSE REDUCTION: This exam was performed according to the departmental dose-optimization program which includes automated exposure control, adjustment of the mA and/or kV according to patient size and/or use of iterative reconstruction technique. CONTRAST:  83 mL OMNIPAQUE IOHEXOL 350 MG/ML SOLN  COMPARISON:  Chest x-ray from earlier in the same day. FINDINGS: Cardiovascular: Atherosclerotic calcifications of the thoracic aorta are noted. Changes consistent with prior coronary bypass grafting are noted. No aneurysmal dilatation is seen. Heart is at the upper limits of normal in size. The pulmonary artery shows a normal branching pattern. No intraluminal filling defect to suggest pulmonary embolism is seen. Mild coronary calcifications are noted. Mediastinum/Nodes: Thoracic inlet is within normal limits. No sizable hilar or mediastinal adenopathy is noted. The esophagus as visualized is within normal limits. Lungs/Pleura: Mild right middle lobe atelectatic changes are noted. Small pleural effusions are noted bilaterally. No sizable parenchymal nodules are seen. No pneumothorax is noted. Upper Abdomen: Visualized upper abdomen is within normal limits. Musculoskeletal: Postsurgical changes in the cervical spine are seen. No acute bony abnormality is noted. Review of the MIP images confirms the above findings. IMPRESSION: No evidence of pulmonary emboli. Right middle lobe atelectatic changes. Small pleural effusions are seen. Aortic Atherosclerosis (ICD10-I70.0). Electronically Signed   By: Inez Catalina M.D.   On: 10/29/2021 21:01   ECHOCARDIOGRAM COMPLETE  Result Date: 10/30/2021    ECHOCARDIOGRAM REPORT   Patient Name:   MITZIE MARLAR Morrical Date of Exam: 10/30/2021 Medical Rec #:  [id number]      Height:       63.0 in Accession #:    [id number]     Weight:       148.5 lb Date of Birth:  [birth year]     BSA:          1.704 m Patient Age:    87 years       BP:           126/76 mmHg Patient Gender: F              HR:           67 bpm. Exam Location:  Inpatient Procedure: 2D Echo, Cardiac Doppler and Color Doppler Indications:    Dyspnea  History:        Patient has prior history of Echocardiogram examinations. CAD,                 COPD, Aortic Valve Disease and Mitral Valve Disease,                 Arrythmias:Atrial  Fibrillation, Signs/Symptoms:Murmur; Risk                 Factors:Hypertension. S/p mitral valve replacement. Status post                 Mitral Valve Replacement-29 mm Medtronic Mosaic porcine                 bioprosthetic. CKD. History of rheumatic fever.  Sonographer:    Clayton Lefort RDCS (AE) Referring Phys: Balta  1. Left ventricular ejection fraction, by estimation, is 60 to 65%. The left ventricle has normal function. The left ventricle has no regional wall motion abnormalities.  Left ventricular diastolic parameters are indeterminate.  2. Right ventricular systolic function is mildly reduced. The right ventricular size is mildly enlarged. There is moderately elevated pulmonary artery systolic pressure.  3. Left atrial size was moderately dilated.  4. Right atrial size was moderately dilated.  5. Normal appearing bioprosthetic MVR no PVL low mean gradient at HR 58 bpm Stent struts protrude into LVOT but no obvious sub valvular gradients . The mitral valve has been repaired/replaced. No evidence of mitral valve regurgitation. No evidence of mitral stenosis.  6. Tricuspid valve regurgitation is moderate.  7. The aortic valve is tricuspid. There is moderate calcification of the aortic valve. There is moderate thickening of the aortic valve. Aortic valve regurgitation is not visualized. Mild aortic valve stenosis.  8. The inferior vena cava is normal in size with greater than 50% respiratory variability, suggesting right atrial pressure of 3 mmHg. FINDINGS  Left Ventricle: Left ventricular ejection fraction, by estimation, is 60 to 65%. The left ventricle has normal function. The left ventricle has no regional wall motion abnormalities. The left ventricular internal cavity size was normal in size. There is  no left ventricular hypertrophy. Left ventricular diastolic parameters are indeterminate. Right Ventricle: The right ventricular size is mildly enlarged. No increase in right ventricular  wall thickness. Right ventricular systolic function is mildly reduced. There is moderately elevated pulmonary artery systolic pressure. The tricuspid regurgitant velocity is 3.29 m/s, and with an assumed right atrial pressure of 15 mmHg, the estimated right ventricular systolic pressure is 37.1 mmHg. Left Atrium: Left atrial size was moderately dilated. Right Atrium: Right atrial size was moderately dilated. Pericardium: There is no evidence of pericardial effusion. Mitral Valve: Normal appearing bioprosthetic MVR no PVL low mean gradient at HR 58 bpm Stent struts protrude into LVOT but no obvious sub valvular gradients. The mitral valve has been repaired/replaced. No evidence of mitral valve regurgitation. No evidence of mitral valve stenosis. MV peak gradient, 15.4 mmHg. The mean mitral valve gradient is 4.0 mmHg. Tricuspid Valve: The tricuspid valve is normal in structure. Tricuspid valve regurgitation is moderate . No evidence of tricuspid stenosis. Aortic Valve: The aortic valve is tricuspid. There is moderate calcification of the aortic valve. There is moderate thickening of the aortic valve. Aortic valve regurgitation is not visualized. Mild aortic stenosis is present. Aortic valve mean gradient measures 13.0 mmHg. Aortic valve peak gradient measures 24.2 mmHg. Pulmonic Valve: The pulmonic valve was normal in structure. Pulmonic valve regurgitation is mild. No evidence of pulmonic stenosis. Aorta: The aortic root is normal in size and structure. Venous: The inferior vena cava is normal in size with greater than 50% respiratory variability, suggesting right atrial pressure of 3 mmHg. IAS/Shunts: No atrial level shunt detected by color flow Doppler.  LEFT VENTRICLE PLAX 2D LVIDd:         4.30 cm LVIDs:         2.90 cm LV PW:         1.20 cm LV IVS:        0.90 cm LVOT diam:     1.70 cm LVOT Area:     2.27 cm  RIGHT VENTRICLE             IVC RV Basal diam:  5.10 cm     IVC diam: 2.20 cm RV Mid diam:    3.60 cm  RV S prime:     10.60 cm/s TAPSE (M-mode): 1.1 cm LEFT ATRIUM  Index        RIGHT ATRIUM           Index LA diam:        4.40 cm 2.58 cm/m   RA Area:     20.60 cm LA Vol (A2C):   87.9 ml 51.59 ml/m  RA Volume:   58.20 ml  34.16 ml/m LA Vol (A4C):   96.1 ml 56.40 ml/m LA Biplane Vol: 98.1 ml 57.58 ml/m  AORTIC VALVE AV Vmax:      246.00 cm/s AV Vmean:     167.000 cm/s AV VTI:       0.521 m AV Peak Grad: 24.2 mmHg AV Mean Grad: 13.0 mmHg  AORTA Ao Root diam: 3.10 cm Ao Asc diam:  3.30 cm MITRAL VALVE              TRICUSPID VALVE MV Area (PHT): 1.98 cm   TR Peak grad:   43.3 mmHg MV Peak grad:  15.4 mmHg  TR Vmax:        329.00 cm/s MV Mean grad:  4.0 mmHg MV Vmax:       1.96 m/s   SHUNTS MV Vmean:      83.5 cm/s  Systemic Diam: 1.70 cm Jenkins Rouge MD Electronically signed by Jenkins Rouge MD Signature Date/Time: 10/30/2021/11:59:32 AM    Final      Scheduled Meds:  azithromycin  250 mg Oral Daily   benzonatate  200 mg Oral BID   budesonide (PULMICORT) nebulizer solution  0.25 mg Nebulization BID   furosemide  40 mg Intravenous Daily   guaiFENesin  600 mg Oral BID   ipratropium-albuterol  3 mL Nebulization TID   methylPREDNISolone (SOLU-MEDROL) injection  40 mg Intravenous Q12H   metoprolol succinate  25 mg Oral Daily   [START ON 11/01/2021] predniSONE  40 mg Oral Q breakfast   warfarin  1 mg Oral ONCE-1600   Warfarin - Pharmacist Dosing Inpatient   Does not apply q1600   Continuous Infusions:   LOS: 1 day    Time spent: 53min  Domenic Polite, MD [facility]   10/31/2021, 11:04 AM

## 2021-11-01 LAB — URINALYSIS, MICROSCOPIC (REFLEX): WBC, UA: >50 WBC/hpf (ref 0–5)

## 2021-11-01 LAB — URINALYSIS, ROUTINE W REFLEX MICROSCOPIC
Bilirubin Urine: NEGATIVE
Glucose, UA: NEGATIVE mg/dL
Hgb urine dipstick: NEGATIVE
Ketones, ur: NEGATIVE mg/dL
Nitrite: NEGATIVE
Protein, ur: NEGATIVE mg/dL
Specific Gravity, Urine: 1.02 (ref 1.005–1.030)
pH: 6 (ref 5.0–8.0)

## 2021-11-01 LAB — BASIC METABOLIC PANEL
Anion gap: 10 (ref 5–15)
BUN: 18 mg/dL (ref 8–23)
CO2: 26 mmol/L (ref 22–32)
Calcium: 9.1 mg/dL (ref 8.9–10.3)
Chloride: 104 mmol/L (ref 98–111)
Creatinine, Ser: 0.98 mg/dL (ref 0.44–1.00)
GFR, Estimated: 57 mL/min — ABNORMAL LOW (ref >60)
Glucose, Bld: 137 mg/dL — ABNORMAL HIGH (ref 70–99)
Potassium: 3.4 mmol/L — ABNORMAL LOW (ref 3.5–5.1)
Sodium: 140 mmol/L (ref 135–145)

## 2021-11-01 LAB — PROTIME-INR
INR: 2.5 — ABNORMAL HIGH (ref 0.8–1.2)
Prothrombin Time: 27.2 seconds — ABNORMAL HIGH (ref 11.4–15.2)

## 2021-11-01 MED ORDER — WARFARIN SODIUM 1 MG PO TABS
1.0000 mg | ORAL_TABLET | Freq: Once | ORAL | Status: AC
Start: 2021-11-01 — End: 2021-11-01
  Administered 2021-11-01: 1 mg via ORAL
  Filled 2021-11-01: qty 1

## 2021-11-01 MED ORDER — SODIUM CHLORIDE 0.9 % IV SOLN
1.0000 g | INTRAVENOUS | Status: DC
  Administered 2021-11-01 – 2021-11-03 (×3): 1 g via INTRAVENOUS
  Filled 2021-11-01 (×3): qty 10

## 2021-11-01 MED ORDER — TORSEMIDE 20 MG PO TABS
40.0000 mg | ORAL_TABLET | Freq: Two times a day (BID) | ORAL | Status: DC
  Administered 2021-11-01 – 2021-11-03 (×5): 40 mg via ORAL
  Filled 2021-11-01 (×5): qty 2

## 2021-11-01 MED ORDER — DIAZEPAM 5 MG PO TABS
5.0000 mg | ORAL_TABLET | Freq: Once | ORAL | Status: AC
  Administered 2021-11-01: 5 mg via ORAL
  Filled 2021-11-01: qty 1

## 2021-11-01 MED ORDER — POTASSIUM CHLORIDE CRYS ER 20 MEQ PO TBCR
40.0000 meq | EXTENDED_RELEASE_TABLET | Freq: Once | ORAL | Status: AC
  Administered 2021-11-01: 40 meq via ORAL
  Filled 2021-11-01: qty 2

## 2021-11-01 MED ORDER — FUROSEMIDE 40 MG PO TABS: 40.0000 mg | ORAL_TABLET | Freq: Every day | ORAL | Status: DC

## 2021-11-01 NOTE — Assessment & Plan Note (Addendum)
UTI  -Started ceftriaxone yesterday, UA was abnormal, follow-up cultures, history of recurrent infections

## 2021-11-01 NOTE — Plan of Care (Signed)
Problem: Elimination: Goal: Will not experience complications related to urinary retention Outcome: Completed/Met   Problem: Activity: Goal: Risk for activity intolerance will decrease Outcome: Completed/Met   Problem: Clinical Measurements: Goal: Respiratory complications will improve Outcome: Completed/Met

## 2021-11-01 NOTE — Progress Notes (Addendum)
ANTICOAGULATION CONSULT NOTE - Follow Up Consult  Pharmacy Consult for Warfarin Indication: atrial fibrillation   Allergies  Allergen Reactions   Aldactone [Spironolactone] Other (See Comments)    dyspnea   Amoxicillin Palpitations    Tachycardia Has patient had a PCN reaction causing immediate rash, facial/tongue/throat swelling, SOB or lightheadedness with hypotension: no Has patient had a PCN reaction causing severe rash involving mucus membranes or skin necrosis: {no Has patient had a PCN reaction that required hospitalization {no Has patient had a PCN reaction occurring within the last 10 years: {yes If all of the above answers are "NO", then may proceed with Cephalosporin use.   Diltiazem Other (See Comments)    Causing headaches    Flagyl [Metronidazole Hcl] Other (See Comments)    Causing headaches     Flovent [Fluticasone Propionate] Other (See Comments)    Leg cramps   Gabapentin Swelling   Lyrica [Pregabalin] Swelling   Quinidine Diarrhea and Other (See Comments)    Fever diarrhea   Simvastatin Other (See Comments)    Leg pain, myalgia   Tramadol Nausea Only   Verapamil Other (See Comments)    myalgias   Amlodipine     Low extremity edema   Crestor [Rosuvastatin]     myalgia   Livalo [Pitavastatin]     myalgias   Zetia [Ezetimibe]     LEG CRAMPS    Ace Inhibitors Other (See Comments)    unknown   Benazepril Hcl Cough   Ciprocin-Fluocin-Procin [Fluocinolone Acetonide] Other (See Comments)    unknown   Ciprofloxacin Diarrhea   Codeine Nausea Only   Nitrofurantoin Monohyd Macro Nausea Only    Patient Measurements: Height: 5\' 3"  (160 cm) Weight: 67.3 kg (148 lb 4.8 oz) IBW/kg (Calculated) : 52.4  Vital Signs: Temp: 97.8 F (36.6 C) (02/05 0428) Temp Source: Oral (02/05 0428) BP: 154/69 (02/05 0428) Pulse Rate: 61 (02/05 0428)  Labs: Recent Labs    10/29/21 1920 10/29/21 2105 10/29/21 2317 10/31/21 0320 11/01/21 0325  HGB 11.5*  --   --   11.5*  --   HCT 34.9*  --   --  32.8*  --   PLT 150  --   --  157  --   LABPROT  --   --  35.1* 31.6* 27.2*  INR  --   --  3.5* 3.1* 2.5*  CREATININE 0.89  --   --  0.91 0.98  TROPONINIHS 26* 23*  --   --   --      Estimated Creatinine Clearance: 39.4 mL/min (by C-G formula based on SCr of 0.98 mg/dL).  Assessment: 87 year old female on warfarin PTA for afib for SOB. Patient has RHD with history of bio-MVR. Patient also taking azithromycin and prednisone, which can increase risk of bleeding. No bleeding noted. CBC WNL. INR  in therapeutic range at 2.5 today. Will redose today at lower than home dose due to interacting medications. The drop to therapeutic range is likely reflecting the held doses rather than the dose on 2/5  INR on admission 3.5 Dose prior to admission 1 mg Wed, Sat., 2 mg all other days  Goal of Therapy:  INR 2-3 Monitor platelets by anticoagulation protocol: Yes   Plan:  Give warfarin 1 mg this evening Daily INR  Thank you for allowing pharmacy to participate in this patient's care.  Reatha Harps, PharmD PGY1 Pharmacy Resident 11/01/2021 7:35 AM Check [email or web address] for unit specific pharmacy number

## 2021-11-01 NOTE — Progress Notes (Signed)
PROGRESS NOTE    Catherine [name]  XNA:[id number] DOB: 07-14-1935 DOA: 10/29/2021 PCP: [name], MD  Brief Narrative: 86/F with history of COPD, chronic diastolic CHF, rheumatic heart disease status post bioprosthetic MVR in 2018, moderate to severe TR, CAD/CABG, CKD 3 AAA presented to the ED with shortness of breath.  recent ER visits with cough fever and URI symptoms. In the ED was hypoxic to 84% with activity, BNP was 234, CT chest noted right middle lobe atelectasis and small pleural effusions  Subjective: -Breathing overall better, this morning noticed burning micturition, also has a sore throat  Assessment and Plan: * Acute respiratory failure with hypoxia (HCC)- (present on admission) Acute COPD exacerbation Acute on chronic diastolic CHF S/p MVR -Clinically improving, primarily suspect COPD exacerbation with mild component of diastolic CHF -Cut down steroids, transition to prednisone taper, continue nebs and azithromycin  -Volume status considerably better, 2 L negative for the transition back to torsemide 40 Mg twice daily her home dose -Weaned off O2, add Mucinex, Tessalon Perles -2D echo noted preserved EF, diastolic dysfunction, moderate PAH, normal-appearing prosthetic MV  Atrial fibrillation (HCC)- (present on admission) - Continue Coumadin per pharmacy -Continue Toprol  Coronary artery disease- (present on admission) - Troponin minimally elevated in the setting of CHF and COPD exacerbation, no symptoms of ACS -EKG without acute findings -Continue beta-blocker, statin  Nausea without vomiting Lipase and LFTs normal.   -Resolved  Dysuria- (present on admission) History of recurrent UTIs, check urinalysis -Ordinarily takes daily Keflex  Essential hypertension- (present on admission) - Restart Toprol   DVT prophylaxis: Warfarin Code Status: Full code Family Communication: No family at bedside Disposition Plan: Home tomorrow if stable   Consultants:     Procedures:   Antimicrobials:    Objective: Vitals:   10/31/21 2012 11/01/21 0428 11/01/21 0935 11/01/21 1142  BP:  (!) 154/69  121/60  Pulse:  61  (!) 57  Resp:  17  17  Temp:  97.8 F (36.6 C)  98 F (36.7 C)  TempSrc:  Oral  Oral  SpO2: 94% 95% 94% 92%  Weight:  67.3 kg    Height:        Intake/Output Summary (Last 24 hours) at 11/01/2021 1216 Last data filed at 11/01/2021 1031 Gross per 24 hour  Intake 875 ml  Output 1225 ml  Net -350 ml   Filed Weights   10/30/21 0042 10/31/21 0408 11/01/21 0428  Weight: 67.4 kg 66.8 kg 67.3 kg    Examination:  General exam: Pleasant elderly female sitting up in bed, AAOx3, no distress HEENT: No JVD CVS: S1-S2, regular rhythm, systolic murmur Lungs: Poor air movement bilaterally, no rales today Abdomen: Soft, nontender, bowel sounds present Extremities: No edema Skin: No rashes Psychiatry:  Mood & affect appropriate.     Data Reviewed:   CBC: Recent Labs  Lab 10/25/21 1303 10/29/21 1920 10/31/21 0320  WBC 11.9* 7.8 9.3  NEUTROABS 9.7* 5.8  --   HGB 12.9 11.5* 11.5*  HCT 38.3 34.9* 32.8*  MCV 91.4 92.8 91.1  PLT 134* 150 542   Basic Metabolic Panel: Recent Labs  Lab 10/25/21 1303 10/29/21 1920 10/31/21 0320 11/01/21 0325  NA 142 143 139 140  K 3.6 3.6 3.8 3.4*  CL 103 104 103 104  CO2 30 30 26 26   GLUCOSE 105* 107* 155* 137*  BUN 14 14 14 18   CREATININE 0.97 0.89 0.91 0.98  CALCIUM 9.2 8.9 9.1 9.1   GFR: Estimated Creatinine Clearance:  39.4 mL/min (by C-G formula based on SCr of 0.98 mg/dL). Liver Function Tests: Recent Labs  Lab 10/25/21 1303 10/29/21 1920  AST 22 20  ALT 17 17  ALKPHOS 72 71  BILITOT 1.3* 0.9  PROT 6.8 6.7  ALBUMIN 4.0 3.7   Recent Labs  Lab 10/25/21 1303 10/29/21 1920  LIPASE 17 23   No results for input(s): AMMONIA in the last 168 hours. Coagulation Profile: Recent Labs  Lab 10/29/21 2317 10/31/21 0320 11/01/21 0325  INR 3.5* 3.1* 2.5*   Cardiac  Enzymes: No results for input(s): CKTOTAL, CKMB, CKMBINDEX, TROPONINI in the last 168 hours. BNP (last 3 results) No results for input(s): PROBNP in the last 8760 hours. HbA1C: No results for input(s): HGBA1C in the last 72 hours. CBG: No results for input(s): GLUCAP in the last 168 hours. Lipid Profile: No results for input(s): CHOL, HDL, LDLCALC, TRIG, CHOLHDL, LDLDIRECT in the last 72 hours. Thyroid Function Tests: No results for input(s): TSH, T4TOTAL, FREET4, T3FREE, THYROIDAB in the last 72 hours. Anemia Panel: No results for input(s): VITAMINB12, FOLATE, FERRITIN, TIBC, IRON, RETICCTPCT in the last 72 hours. Urine analysis:    Component Value Date/Time   COLORURINE YELLOW 11/01/2021 [place] 11/01/2021 1045   LABSPEC 1.020 11/01/2021 1045   PHURINE 6.0 11/01/2021 1045   GLUCOSEU NEGATIVE 11/01/2021 1045   HGBUR NEGATIVE 11/01/2021 [place] 11/01/2021 1045   BILIRUBINUR negative 09/09/2020 1129   KETONESUR NEGATIVE 11/01/2021 1045   PROTEINUR NEGATIVE 11/01/2021 1045   UROBILINOGEN 0.2 09/09/2020 1129   UROBILINOGEN 0.2 03/15/2015 2151   NITRITE NEGATIVE 11/01/2021 1045   LEUKOCYTESUR SMALL (A) 11/01/2021 1045   Sepsis Labs: @LABRCNTIP (procalcitonin:4,lacticidven:4)  ) Recent Results (from the past 240 hour(s))  Resp Panel by RT-PCR (Flu A&B, Covid) Nasopharyngeal Swab     Status: None   Collection Time: 10/25/21  3:07 PM   Specimen: Nasopharyngeal Swab; Nasopharyngeal(NP) swabs in vial transport medium  Result Value Ref Range Status   SARS Coronavirus 2 by RT PCR NEGATIVE NEGATIVE Final    Comment: (NOTE) SARS-CoV-2 target nucleic acids are NOT DETECTED.  The SARS-CoV-2 RNA is generally detectable in upper respiratory specimens during the acute phase of infection. The lowest concentration of SARS-CoV-2 viral copies this assay can detect is 138 copies/mL. A negative result does not preclude SARS-Cov-2 infection and should not  be used as the sole basis for treatment or other patient management decisions. A negative result may occur with  improper specimen collection/handling, submission of specimen other than nasopharyngeal swab, presence of viral mutation(s) within the areas targeted by this assay, and inadequate number of viral copies(<138 copies/mL). A negative result must be combined with clinical observations, patient history, and epidemiological information. The expected result is Negative.  Fact Sheet for Patients:  EntrepreneurPulse.com.au  Fact Sheet for Healthcare Providers:  IncredibleEmployment.be  This test is no t yet approved or cleared by the [place] FDA and  has been authorized for detection and/or diagnosis of SARS-CoV-2 by FDA under an Emergency Use Authorization (EUA). This EUA will remain  in effect (meaning this test can be used) for the duration of the COVID-19 declaration under Section 564(b)(1) of the Act, 21 U.S.C.section 360bbb-3(b)(1), unless the authorization is terminated  or revoked sooner.       Influenza A by PCR NEGATIVE NEGATIVE Final   Influenza B by PCR NEGATIVE NEGATIVE Final    Comment: (NOTE) The Xpert Xpress SARS-CoV-2/FLU/RSV plus assay is intended as an aid  in the diagnosis of influenza from Nasopharyngeal swab specimens and should not be used as a sole basis for treatment. Nasal washings and aspirates are unacceptable for Xpert Xpress SARS-CoV-2/FLU/RSV testing.  Fact Sheet for Patients: EntrepreneurPulse.com.au  Fact Sheet for Healthcare Providers: IncredibleEmployment.be  This test is not yet approved or cleared by the [place] FDA and has been authorized for detection and/or diagnosis of SARS-CoV-2 by FDA under an Emergency Use Authorization (EUA). This EUA will remain in effect (meaning this test can be used) for the duration of the COVID-19 declaration under Section  564(b)(1) of the Act, 21 U.S.C. section 360bbb-3(b)(1), unless the authorization is terminated or revoked.  Performed at [facility], [address], [place], [place] [address]   Resp Panel by RT-PCR (Flu A&B, Covid) Nasopharyngeal Swab     Status: None   Collection Time: 10/29/21  7:30 PM   Specimen: Nasopharyngeal Swab; Nasopharyngeal(NP) swabs in vial transport medium  Result Value Ref Range Status   SARS Coronavirus 2 by RT PCR NEGATIVE NEGATIVE Final    Comment: (NOTE) SARS-CoV-2 target nucleic acids are NOT DETECTED.  The SARS-CoV-2 RNA is generally detectable in upper respiratory specimens during the acute phase of infection. The lowest concentration of SARS-CoV-2 viral copies this assay can detect is 138 copies/mL. A negative result does not preclude SARS-Cov-2 infection and should not be used as the sole basis for treatment or other patient management decisions. A negative result may occur with  improper specimen collection/handling, submission of specimen other than nasopharyngeal swab, presence of viral mutation(s) within the areas targeted by this assay, and inadequate number of viral copies(<138 copies/mL). A negative result must be combined with clinical observations, patient history, and epidemiological information. The expected result is Negative.  Fact Sheet for Patients:  EntrepreneurPulse.com.au  Fact Sheet for Healthcare Providers:  IncredibleEmployment.be  This test is no t yet approved or cleared by the [place] FDA and  has been authorized for detection and/or diagnosis of SARS-CoV-2 by FDA under an Emergency Use Authorization (EUA). This EUA will remain  in effect (meaning this test can be used) for the duration of the COVID-19 declaration under Section 564(b)(1) of the Act, 21 U.S.C.section 360bbb-3(b)(1), unless the authorization is terminated  or revoked sooner.       Influenza A by PCR  NEGATIVE NEGATIVE Final   Influenza B by PCR NEGATIVE NEGATIVE Final    Comment: (NOTE) The Xpert Xpress SARS-CoV-2/FLU/RSV plus assay is intended as an aid in the diagnosis of influenza from Nasopharyngeal swab specimens and should not be used as a sole basis for treatment. Nasal washings and aspirates are unacceptable for Xpert Xpress SARS-CoV-2/FLU/RSV testing.  Fact Sheet for Patients: EntrepreneurPulse.com.au  Fact Sheet for Healthcare Providers: IncredibleEmployment.be  This test is not yet approved or cleared by the [place] FDA and has been authorized for detection and/or diagnosis of SARS-CoV-2 by FDA under an Emergency Use Authorization (EUA). This EUA will remain in effect (meaning this test can be used) for the duration of the COVID-19 declaration under Section 564(b)(1) of the Act, 21 U.S.C. section 360bbb-3(b)(1), unless the authorization is terminated or revoked.  Performed at [facility], [address], [place], [place] [address]      Radiology Studies: No results found.   Scheduled Meds:  azithromycin  250 mg Oral Daily   benzonatate  200 mg Oral BID   budesonide (PULMICORT) nebulizer solution  0.25 mg Nebulization BID   guaiFENesin  600 mg Oral BID  ipratropium-albuterol  3 mL Nebulization TID   metoprolol succinate  25 mg Oral Daily   predniSONE  40 mg Oral Q breakfast   torsemide  40 mg Oral BID   warfarin  1 mg Oral ONCE-1600   Warfarin - Pharmacist Dosing Inpatient   Does not apply q1600   Continuous Infusions:   LOS: 2 days    Time spent: 46min  Domenic Polite, MD [facility]   11/01/2021, 12:16 PM

## 2021-11-01 NOTE — Plan of Care (Signed)
Problem: Skin Integrity: Goal: Risk for impaired skin integrity will decrease Outcome: Completed/Met   Problem: Safety: Goal: Ability to remain free from injury will improve Outcome: Completed/Met   Problem: Pain Managment: Goal: General experience of comfort will improve Outcome: Completed/Met   Problem: Coping: Goal: Level of anxiety will decrease Outcome: Completed/Met   Problem: Nutrition: Goal: Adequate nutrition will be maintained Outcome: Completed/Met   

## 2021-11-02 ENCOUNTER — Inpatient Hospital Stay (HOSPITAL_COMMUNITY): Payer: Medicare Other

## 2021-11-02 DIAGNOSIS — R0789 Other chest pain: Secondary | ICD-10-CM

## 2021-11-02 LAB — PROTIME-INR
INR: 2.1 — ABNORMAL HIGH (ref 0.8–1.2)
Prothrombin Time: 23.9 seconds — ABNORMAL HIGH (ref 11.4–15.2)

## 2021-11-02 MED ORDER — WARFARIN SODIUM 2.5 MG PO TABS
2.5000 mg | ORAL_TABLET | Freq: Once | ORAL | Status: AC
  Administered 2021-11-02: 2.5 mg via ORAL
  Filled 2021-11-02: qty 1

## 2021-11-02 MED ORDER — MUSCLE RUB 10-15 % EX CREA
TOPICAL_CREAM | CUTANEOUS | Status: DC | PRN
  Filled 2021-11-02: qty 85

## 2021-11-02 MED ORDER — PREDNISONE 20 MG PO TABS
30.0000 mg | ORAL_TABLET | Freq: Every day | ORAL | Status: DC
  Administered 2021-11-03: 30 mg via ORAL
  Filled 2021-11-02: qty 1

## 2021-11-02 MED ORDER — HYDROCODONE-ACETAMINOPHEN 5-325 MG PO TABS
1.0000 | ORAL_TABLET | Freq: Four times a day (QID) | ORAL | Status: AC | PRN
  Administered 2021-11-02 – 2021-11-03 (×2): 1 via ORAL
  Filled 2021-11-02 (×2): qty 1

## 2021-11-02 MED ORDER — IPRATROPIUM-ALBUTEROL 0.5-2.5 (3) MG/3ML IN SOLN: 3.0000 mL | Freq: Four times a day (QID) | RESPIRATORY_TRACT | Status: DC | PRN

## 2021-11-02 MED ORDER — METHOCARBAMOL 500 MG PO TABS
750.0000 mg | ORAL_TABLET | Freq: Once | ORAL | Status: AC
  Administered 2021-11-02: 750 mg via ORAL
  Filled 2021-11-02: qty 2

## 2021-11-02 MED ORDER — ROSUVASTATIN CALCIUM 5 MG PO TABS
5.0000 mg | ORAL_TABLET | ORAL | Status: DC
  Administered 2021-11-02: 5 mg via ORAL
  Filled 2021-11-02: qty 1

## 2021-11-02 NOTE — Progress Notes (Signed)
ANTICOAGULATION CONSULT NOTE - Follow Up Consult  Pharmacy Consult for Warfarin Indication: atrial fibrillation   Allergies  Allergen Reactions   Aldactone [Spironolactone] Other (See Comments)    dyspnea   Amoxicillin Palpitations    Tachycardia Has patient had a PCN reaction causing immediate rash, facial/tongue/throat swelling, SOB or lightheadedness with hypotension: no Has patient had a PCN reaction causing severe rash involving mucus membranes or skin necrosis: {no Has patient had a PCN reaction that required hospitalization {no Has patient had a PCN reaction occurring within the last 10 years: {yes If all of the above answers are "NO", then may proceed with Cephalosporin use.   Diltiazem Other (See Comments)    Causing headaches    Flagyl [Metronidazole Hcl] Other (See Comments)    Causing headaches     Flovent [Fluticasone Propionate] Other (See Comments)    Leg cramps   Gabapentin Swelling   Lyrica [Pregabalin] Swelling   Quinidine Diarrhea and Other (See Comments)    Fever diarrhea   Simvastatin Other (See Comments)    Leg pain, myalgia   Tramadol Nausea Only   Verapamil Other (See Comments)    myalgias   Amlodipine     Low extremity edema   Crestor [Rosuvastatin]     myalgia   Livalo [Pitavastatin]     myalgias   Zetia [Ezetimibe]     LEG CRAMPS    Ace Inhibitors Other (See Comments)    unknown   Benazepril Hcl Cough   Ciprocin-Fluocin-Procin [Fluocinolone Acetonide] Other (See Comments)    unknown   Ciprofloxacin Diarrhea   Codeine Nausea Only   Nitrofurantoin Monohyd Macro Nausea Only    Patient Measurements: Height: 5\' 3"  (160 cm) Weight: 67.6 kg (149 lb) IBW/kg (Calculated) : 52.4  Vital Signs: Temp: 98.1 F (36.7 C) (02/06 0417) Temp Source: Oral (02/06 0417) BP: 136/46 (02/06 0417)  Labs: Recent Labs    10/31/21 0320 11/01/21 0325 11/02/21 0233  HGB 11.5*  --   --   HCT 32.8*  --   --   PLT 157  --   --   LABPROT 31.6* 27.2*  23.9*  INR 3.1* 2.5* 2.1*  CREATININE 0.91 0.98  --      Estimated Creatinine Clearance: 39.5 mL/min (by C-G formula based on SCr of 0.98 mg/dL).  Assessment: 87 year old female on warfarin PTA for afib for SOB. Patient has RHD with history of bio-MVR. INR on admission 3.5. Last dose PTA was on 2/1. No dose given on 2/2-3.  No bleeding noted. PO intake down today, documented at 25%. Previously documented between 75-100%. INR is therapeutic at 2.1 today.   Interacting medications: azithromycin started 2/3, doxycycline x 1 on 2/2  PTA warfarin dose: 1 mg on Wed and Sat, 2 mg all other days  Goal of Therapy:  INR 2-3 Monitor platelets by anticoagulation protocol: Yes   Plan:  Give warfarin 2.5 mg po x 1 Monitor daily INR, CBC, clinical course, s/sx of bleed, PO intake/diet, Drug-Drug Interactions   Thank you for allowing [name] to participate in this patients care. Jens Som, PharmD 11/02/2021 10:34 AM  **Pharmacist phone directory can be found on [email or web address] listed under [place]**

## 2021-11-02 NOTE — Progress Notes (Signed)
PROGRESS NOTE    Catherine [name]  OJJ:[id number] DOB: July 16, 1935 DOA: 10/29/2021 PCP: [name], MD  Brief Narrative: 86/F with history of COPD, chronic diastolic CHF, rheumatic heart disease status post bioprosthetic MVR in 2018, moderate to severe TR, CAD/CABG, CKD 3 AAA presented to the ED with shortness of breath.  recent ER visits with cough fever and URI symptoms. In the ED was hypoxic to 84% with activity, BNP was 234, CT chest noted right middle lobe atelectasis and small pleural effusions  Subjective: -Breathing overall better, this morning noticed burning micturition, also has a sore throat  Assessment and Plan: * Acute respiratory failure with hypoxia (HCC)- (present on admission) Acute COPD exacerbation Acute on chronic diastolic CHF S/p MVR -Clinically improving, primarily suspect COPD exacerbation with mild component of diastolic CHF -Cut down steroids, transitioned to prednisone taper, continue nebs and azithromycin  -Volume status considerably better, 2 L negative for the transition back to torsemide 40 Mg twice daily her home dose -2D echo noted preserved EF, diastolic dysfunction, moderate PAH, normal-appearing prosthetic MV -Weaned off O2, continue Mucinex, Tessalon Perles  Atrial fibrillation (HCC)- (present on admission) - Continue Coumadin per pharmacy -Continue Toprol  Coronary artery disease- (present on admission) - Troponin minimally elevated in the setting of CHF and COPD exacerbation, no symptoms of ACS -EKG without acute findings -Continue beta-blocker, statin  Nausea without vomiting Lipase and LFTs normal.   -Resolved  Dysuria- (present on admission) UTI  -Started ceftriaxone yesterday, UA was abnormal, follow-up cultures, history of recurrent infections  Chest wall pain Patient describes pain across right lateral chest wall and upper back -Try muscle relaxer, lidocaine patch -Check x-rays if persists -Clinically do not suspect PE she  is anticoagulated with Coumadin  Essential hypertension- (present on admission) - Restart Toprol   DVT prophylaxis: Warfarin Code Status: Full code Family Communication: No family at bedside Disposition Plan: Home later today or in a.m.   Consultants:    Procedures:   Antimicrobials:    Objective: Vitals:   11/02/21 0417 11/02/21 0831 11/02/21 0833 11/02/21 1243  BP: (!) 136/46   (!) 137/53  Pulse:    62  Resp:    18  Temp: 98.1 F (36.7 C)   99.4 F (37.4 C)  TempSrc: Oral   Oral  SpO2:  98% 98% 94%  Weight:      Height:        Intake/Output Summary (Last 24 hours) at 11/02/2021 1535 Last data filed at 11/02/2021 1300 Gross per 24 hour  Intake 800 ml  Output 4175 ml  Net -3375 ml   Filed Weights   10/31/21 0408 11/01/21 0428 11/02/21 0000  Weight: 66.8 kg 67.3 kg 67.6 kg    Examination:  General exam: Pleasant elderly female sitting up in bed, AAOx3, no distress HEENT: No JVD CVS: S1-S2, regular rhythm, systolic murmur Lungs: Improved air movement, no rales Abdomen: Soft, nontender, bowel sounds present Extremities: No edema Skin: No rashes Psychiatry:  Mood & affect appropriate.     Data Reviewed:   CBC: Recent Labs  Lab 10/29/21 1920 10/31/21 0320  WBC 7.8 9.3  NEUTROABS 5.8  --   HGB 11.5* 11.5*  HCT 34.9* 32.8*  MCV 92.8 91.1  PLT 150 937   Basic Metabolic Panel: Recent Labs  Lab 10/29/21 1920 10/31/21 0320 11/01/21 0325  NA 143 139 140  K 3.6 3.8 3.4*  CL 104 103 104  CO2 30 26 26   GLUCOSE 107* 155* 137*  BUN  14 14 18   CREATININE 0.89 0.91 0.98  CALCIUM 8.9 9.1 9.1   GFR: Estimated Creatinine Clearance: 39.5 mL/min (by C-G formula based on SCr of 0.98 mg/dL). Liver Function Tests: Recent Labs  Lab 10/29/21 1920  AST 20  ALT 17  ALKPHOS 71  BILITOT 0.9  PROT 6.7  ALBUMIN 3.7   Recent Labs  Lab 10/29/21 1920  LIPASE 23   No results for input(s): AMMONIA in the last 168 hours. Coagulation Profile: Recent Labs   Lab 10/29/21 2317 10/31/21 0320 11/01/21 0325 11/02/21 0233  INR 3.5* 3.1* 2.5* 2.1*   Cardiac Enzymes: No results for input(s): CKTOTAL, CKMB, CKMBINDEX, TROPONINI in the last 168 hours. BNP (last 3 results) No results for input(s): PROBNP in the last 8760 hours. HbA1C: No results for input(s): HGBA1C in the last 72 hours. CBG: No results for input(s): GLUCAP in the last 168 hours. Lipid Profile: No results for input(s): CHOL, HDL, LDLCALC, TRIG, CHOLHDL, LDLDIRECT in the last 72 hours. Thyroid Function Tests: No results for input(s): TSH, T4TOTAL, FREET4, T3FREE, THYROIDAB in the last 72 hours. Anemia Panel: No results for input(s): VITAMINB12, FOLATE, FERRITIN, TIBC, IRON, RETICCTPCT in the last 72 hours. Urine analysis:    Component Value Date/Time   COLORURINE YELLOW 11/01/2021 [place] 11/01/2021 1045   LABSPEC 1.020 11/01/2021 1045   PHURINE 6.0 11/01/2021 1045   GLUCOSEU NEGATIVE 11/01/2021 1045   HGBUR NEGATIVE 11/01/2021 [place] 11/01/2021 1045   BILIRUBINUR negative 09/09/2020 1129   KETONESUR NEGATIVE 11/01/2021 1045   PROTEINUR NEGATIVE 11/01/2021 1045   UROBILINOGEN 0.2 09/09/2020 1129   UROBILINOGEN 0.2 03/15/2015 2151   NITRITE NEGATIVE 11/01/2021 1045   LEUKOCYTESUR SMALL (A) 11/01/2021 1045   Sepsis Labs: @LABRCNTIP (procalcitonin:4,lacticidven:4)  ) Recent Results (from the past 240 hour(s))  Resp Panel by RT-PCR (Flu A&B, Covid) Nasopharyngeal Swab     Status: None   Collection Time: 10/25/21  3:07 PM   Specimen: Nasopharyngeal Swab; Nasopharyngeal(NP) swabs in vial transport medium  Result Value Ref Range Status   SARS Coronavirus 2 by RT PCR NEGATIVE NEGATIVE Final    Comment: (NOTE) SARS-CoV-2 target nucleic acids are NOT DETECTED.  The SARS-CoV-2 RNA is generally detectable in upper respiratory specimens during the acute phase of infection. The lowest concentration of SARS-CoV-2 viral copies this assay  can detect is 138 copies/mL. A negative result does not preclude SARS-Cov-2 infection and should not be used as the sole basis for treatment or other patient management decisions. A negative result may occur with  improper specimen collection/handling, submission of specimen other than nasopharyngeal swab, presence of viral mutation(s) within the areas targeted by this assay, and inadequate number of viral copies(<138 copies/mL). A negative result must be combined with clinical observations, patient history, and epidemiological information. The expected result is Negative.  Fact Sheet for Patients:  EntrepreneurPulse.com.au  Fact Sheet for Healthcare Providers:  IncredibleEmployment.be  This test is no t yet approved or cleared by the [place] FDA and  has been authorized for detection and/or diagnosis of SARS-CoV-2 by FDA under an Emergency Use Authorization (EUA). This EUA will remain  in effect (meaning this test can be used) for the duration of the COVID-19 declaration under Section 564(b)(1) of the Act, 21 U.S.C.section 360bbb-3(b)(1), unless the authorization is terminated  or revoked sooner.       Influenza A by PCR NEGATIVE NEGATIVE Final   Influenza B by PCR NEGATIVE NEGATIVE Final    Comment: (NOTE)  The Xpert Xpress SARS-CoV-2/FLU/RSV plus assay is intended as an aid in the diagnosis of influenza from Nasopharyngeal swab specimens and should not be used as a sole basis for treatment. Nasal washings and aspirates are unacceptable for Xpert Xpress SARS-CoV-2/FLU/RSV testing.  Fact Sheet for Patients: EntrepreneurPulse.com.au  Fact Sheet for Healthcare Providers: IncredibleEmployment.be  This test is not yet approved or cleared by the [place] FDA and has been authorized for detection and/or diagnosis of SARS-CoV-2 by FDA under an Emergency Use Authorization (EUA). This EUA will  remain in effect (meaning this test can be used) for the duration of the COVID-19 declaration under Section 564(b)(1) of the Act, 21 U.S.C. section 360bbb-3(b)(1), unless the authorization is terminated or revoked.  Performed at [facility], [address], [place], [place] [address]   Resp Panel by RT-PCR (Flu A&B, Covid) Nasopharyngeal Swab     Status: None   Collection Time: 10/29/21  7:30 PM   Specimen: Nasopharyngeal Swab; Nasopharyngeal(NP) swabs in vial transport medium  Result Value Ref Range Status   SARS Coronavirus 2 by RT PCR NEGATIVE NEGATIVE Final    Comment: (NOTE) SARS-CoV-2 target nucleic acids are NOT DETECTED.  The SARS-CoV-2 RNA is generally detectable in upper respiratory specimens during the acute phase of infection. The lowest concentration of SARS-CoV-2 viral copies this assay can detect is 138 copies/mL. A negative result does not preclude SARS-Cov-2 infection and should not be used as the sole basis for treatment or other patient management decisions. A negative result may occur with  improper specimen collection/handling, submission of specimen other than nasopharyngeal swab, presence of viral mutation(s) within the areas targeted by this assay, and inadequate number of viral copies(<138 copies/mL). A negative result must be combined with clinical observations, patient history, and epidemiological information. The expected result is Negative.  Fact Sheet for Patients:  EntrepreneurPulse.com.au  Fact Sheet for Healthcare Providers:  IncredibleEmployment.be  This test is no t yet approved or cleared by the [place] FDA and  has been authorized for detection and/or diagnosis of SARS-CoV-2 by FDA under an Emergency Use Authorization (EUA). This EUA will remain  in effect (meaning this test can be used) for the duration of the COVID-19 declaration under Section 564(b)(1) of the Act,  21 U.S.C.section 360bbb-3(b)(1), unless the authorization is terminated  or revoked sooner.       Influenza A by PCR NEGATIVE NEGATIVE Final   Influenza B by PCR NEGATIVE NEGATIVE Final    Comment: (NOTE) The Xpert Xpress SARS-CoV-2/FLU/RSV plus assay is intended as an aid in the diagnosis of influenza from Nasopharyngeal swab specimens and should not be used as a sole basis for treatment. Nasal washings and aspirates are unacceptable for Xpert Xpress SARS-CoV-2/FLU/RSV testing.  Fact Sheet for Patients: EntrepreneurPulse.com.au  Fact Sheet for Healthcare Providers: IncredibleEmployment.be  This test is not yet approved or cleared by the [place] FDA and has been authorized for detection and/or diagnosis of SARS-CoV-2 by FDA under an Emergency Use Authorization (EUA). This EUA will remain in effect (meaning this test can be used) for the duration of the COVID-19 declaration under Section 564(b)(1) of the Act, 21 U.S.C. section 360bbb-3(b)(1), unless the authorization is terminated or revoked.  Performed at [facility], [address], [place], [place] [address]   Urine Culture     Status: Abnormal (Preliminary result)   Collection Time: 11/01/21 10:45 AM   Specimen: Urine, Clean Catch  Result Value Ref Range Status   Specimen Description URINE, CLEAN CATCH  Final   Special Requests NONE  Final   Culture (A)  Final    >=100,000 COLONIES/mL GRAM NEGATIVE RODS IDENTIFICATION AND SUSCEPTIBILITIES TO FOLLOW Performed at [facility], [place] [address] [place], [place] [address]    Report Status PENDING  Incomplete     Radiology Studies: No results found.   Scheduled Meds:  azithromycin  250 mg Oral Daily   benzonatate  200 mg Oral BID   budesonide (PULMICORT) nebulizer solution  0.25 mg Nebulization BID   guaiFENesin  600 mg Oral BID   metoprolol succinate  25 mg Oral Daily   [START ON 11/03/2021]  predniSONE  30 mg Oral Q breakfast   rosuvastatin  5 mg Oral Once per day on Sun Mon Wed Fri   torsemide  40 mg Oral BID   warfarin  2.5 mg Oral ONCE-1600   Warfarin - Pharmacist Dosing Inpatient   Does not apply q1600   Continuous Infusions:  cefTRIAXone (ROCEPHIN)  IV 1 g (11/02/21 1249)     LOS: 3 days    Time spent: 76min  Domenic Polite, MD [facility]   11/02/2021, 3:35 PM

## 2021-11-02 NOTE — Assessment & Plan Note (Signed)
Patient describes pain across right lateral chest wall and upper back -Try muscle relaxer, lidocaine patch -Check x-rays if persists -Clinically do not suspect PE she is anticoagulated with Coumadin

## 2021-11-02 NOTE — Plan of Care (Signed)
°  Problem: Education: Goal: Knowledge of disease or condition will improve Outcome: Progressing   Problem: Respiratory: Goal: Ability to maintain a clear airway will improve Outcome: Progressing   Problem: Respiratory: Goal: Levels of oxygenation will improve Outcome: Progressing

## 2021-11-02 NOTE — Consult Note (Signed)
° °  [facility] CM Inpatient Consult   11/02/2021  [name] [birth year] [id number]  Follow up:  Met with the patient at the bedside and spoke about her being active in the [employer] [employer] with pharmacy.  Patient spoke at length regarding her hospitalization.  She speaks of continue discomfort on her right side despite muscle relaxer.  Encouraged to speak with attending of ongoing concerns. Patient endorses Dr. Elease Hashimoto and insurance.  She denies any SDOH concerns, husband does work but able to get to appointments and food.   Plan:  Patient agrees to Embedded nurse for follow up, states, "if I need it, I don't mind the extra support for me an my husband.". This Probation officer will continue to follow for appropriateness of needs.  For questions, please contact:  Natividad Brood, RN BSN [facility] Liaison  [phone] business mobile phone Toll free office [phone]  Fax number: [phone] [email or web address]

## 2021-11-02 NOTE — Care Management Important Message (Signed)
Important Message  Patient Details  Name: Catherine [name] MRN: [id number] Date of Birth: [birth year]   Medicare Important Message Given:  Yes     Shelda Altes 11/02/2021, 8:39 AM

## 2021-11-03 LAB — CBC
HCT: 36.2 % (ref 36.0–46.0)
Hemoglobin: 12.4 g/dL (ref 12.0–15.0)
MCH: 30.5 pg (ref 26.0–34.0)
MCHC: 34.3 g/dL (ref 30.0–36.0)
MCV: 88.9 fL (ref 80.0–100.0)
Platelets: 201 10*3/uL (ref 150–400)
RBC: 4.07 MIL/uL (ref 3.87–5.11)
RDW: 13.4 % (ref 11.5–15.5)
WBC: 12.9 10*3/uL — ABNORMAL HIGH (ref 4.0–10.5)
nRBC: 0 % (ref 0.0–0.2)

## 2021-11-03 LAB — BASIC METABOLIC PANEL
Anion gap: 11 (ref 5–15)
Anion gap: 12 (ref 5–15)
BUN: 20 mg/dL (ref 8–23)
BUN: 21 mg/dL (ref 8–23)
CO2: 28 mmol/L (ref 22–32)
CO2: 26 mmol/L (ref 22–32)
Calcium: 8.4 mg/dL — ABNORMAL LOW (ref 8.9–10.3)
Calcium: 8.8 mg/dL — ABNORMAL LOW (ref 8.9–10.3)
Chloride: 100 mmol/L (ref 98–111)
Chloride: 103 mmol/L (ref 98–111)
Creatinine, Ser: 0.71 mg/dL (ref 0.44–1.00)
Creatinine, Ser: 1 mg/dL (ref 0.44–1.00)
GFR, Estimated: >60 mL/min (ref >60)
GFR, Estimated: 56 mL/min — ABNORMAL LOW (ref >60)
Glucose, Bld: 81 mg/dL (ref 70–99)
Glucose, Bld: 134 mg/dL — ABNORMAL HIGH (ref 70–99)
Potassium: 2.5 mmol/L — CL (ref 3.5–5.1)
Potassium: 3.4 mmol/L — ABNORMAL LOW (ref 3.5–5.1)
Sodium: 139 mmol/L (ref 135–145)
Sodium: 141 mmol/L (ref 135–145)

## 2021-11-03 LAB — PROTIME-INR
INR: 2.3 — ABNORMAL HIGH (ref 0.8–1.2)
Prothrombin Time: 25 seconds — ABNORMAL HIGH (ref 11.4–15.2)

## 2021-11-03 LAB — URINE CULTURE: Culture: >=100000 — AB

## 2021-11-03 MED ORDER — CEPHALEXIN 250 MG PO CAPS: 250.0000 mg | ORAL_CAPSULE | Freq: Three times a day (TID) | ORAL | Status: DC

## 2021-11-03 MED ORDER — SENNA 8.6 MG PO TABS
1.0000 | ORAL_TABLET | Freq: Every day | ORAL | 0 refills | Status: DC | PRN
Start: 2021-11-03 — End: 2022-01-05

## 2021-11-03 MED ORDER — HYDROCODONE-ACETAMINOPHEN 5-325 MG PO TABS: 1.0000 | ORAL_TABLET | Freq: Four times a day (QID) | ORAL | Status: DC | PRN

## 2021-11-03 MED ORDER — WARFARIN SODIUM 1 MG PO TABS: 1.0000 mg | ORAL_TABLET | ORAL | Status: DC

## 2021-11-03 MED ORDER — POTASSIUM CHLORIDE 10 MEQ/100ML IV SOLN
10.0000 meq | INTRAVENOUS | Status: AC
  Administered 2021-11-03 (×2): 10 meq via INTRAVENOUS
  Filled 2021-11-03 (×2): qty 100

## 2021-11-03 MED ORDER — CEPHALEXIN 250 MG PO CAPS: 250.0000 mg | ORAL_CAPSULE | Freq: Three times a day (TID) | ORAL | 0 refills | Status: AC

## 2021-11-03 MED ORDER — PREDNISONE 10 MG PO TABS: 20.0000 mg | ORAL_TABLET | Freq: Every day | ORAL | 0 refills | Status: AC

## 2021-11-03 MED ORDER — CEPHALEXIN 250 MG PO CAPS: 250.0000 mg | ORAL_CAPSULE | Freq: Four times a day (QID) | ORAL | 0 refills | Status: DC

## 2021-11-03 MED ORDER — HYDROCODONE-ACETAMINOPHEN 5-325 MG PO TABS: 1.0000 | ORAL_TABLET | Freq: Three times a day (TID) | ORAL | 0 refills | Status: DC | PRN

## 2021-11-03 MED ORDER — POTASSIUM CHLORIDE CRYS ER 20 MEQ PO TBCR
40.0000 meq | EXTENDED_RELEASE_TABLET | ORAL | Status: AC
  Administered 2021-11-03 (×3): 40 meq via ORAL
  Filled 2021-11-03 (×3): qty 2

## 2021-11-03 MED ORDER — METHOCARBAMOL 500 MG PO TABS: 500.0000 mg | ORAL_TABLET | Freq: Four times a day (QID) | ORAL | Status: DC | PRN

## 2021-11-03 MED ORDER — WARFARIN SODIUM 2 MG PO TABS
2.0000 mg | ORAL_TABLET | ORAL | Status: DC
  Administered 2021-11-03: 2 mg via ORAL
  Filled 2021-11-03: qty 1

## 2021-11-03 NOTE — Discharge Summary (Signed)
Physician Discharge Summary  Catherine [name] WJX:[id number] DOB: Oct 29, 1934 DOA: 10/29/2021  PCP: [name], MD  Admit date: 10/29/2021 Discharge date: 11/03/2021  Time spent: 35 minutes  Recommendations for Outpatient Follow-up:  PCP in 1 week Cardiology Dr. Oval Linsey in 2 to 3 weeks  Discharge Diagnoses:  Principal Problem:   Acute respiratory failure with hypoxia (HCC) COPD exacerbation Acute on chronic diastolic CHF Right-sided chest wall pain Status post MVR   Atrial fibrillation (HCC)   Coronary artery disease   Nausea without vomiting   Dysuria   Chest wall pain   Essential hypertension   COPD with acute exacerbation [facility])   Discharge Condition: Stable  Diet recommendation: Low-sodium, heart healthy  Filed Weights   11/01/21 0428 11/02/21 0000 11/03/21 0335  Weight: 67.3 kg 67.6 kg 65.8 kg    History of present illness:   86/F with history of COPD, chronic diastolic CHF, rheumatic heart disease status post bioprosthetic MVR in 2018, moderate to severe TR, CAD/CABG, CKD 3 AAA presented to the ED with shortness of breath.  recent ER visits with cough fever and URI symptoms. In the ED was hypoxic to 84% with activity, BNP was 234, CT chest noted right middle lobe atelectasis and small pleural effusions  Hospital Course:   Acute respiratory failure with hypoxia (HCC)- (present on admission) Acute COPD exacerbation Acute on chronic diastolic CHF S/p MVR -Clinically improving, primarily suspected to be COPD exacerbation with mild component of diastolic CHF -Cut down steroids, transitioned to prednisone taper, continue nebs  -Volume status considerably better, 2 L negative for the transition back to torsemide 40 Mg twice daily her home dose -2D echo noted preserved EF, diastolic dysfunction, moderate PAH, normal-appearing prosthetic MV -Weaned off O2,  -FU with PCP in 1 week and cardiology in few weeks   Atrial fibrillation ([place])- (present on admission) -  Continue Coumadin -Continue Toprol   Coronary artery disease- (present on admission) - Troponin minimally elevated in the setting of CHF and COPD exacerbation, no symptoms of ACS -EKG without acute findings -Continue beta-blocker, statin   Nausea without vomiting Lipase and LFTs normal.   -Resolved   Dysuria- (present on admission) UTI  -Started ceftriaxone 2 days ago, urine culture growing sensitive E. coli, transition to Keflex at discharge for 3 more days, history of recurrent infections, advised against long-term antibiotics due to risk of resistance   Chest wall pain Patient describes pain across right lateral chest wall and upper back, musculoskeletal -X-rays were unremarkable -Clinically do not suspect PE she is anticoagulated with Coumadin -Improved on Vicodin, given prescription for a few pills at discharge-this will not be a long-term medication   Essential hypertension- (present on admission) - continue Toprol  Discharge Exam: Vitals:   11/03/21 0826 11/03/21 1119  BP:  116/68  Pulse:  (!) 51  Resp:  18  Temp:  99.6 F (37.6 C)  SpO2: 96% 93%   General exam: Pleasant elderly female sitting up in bed, AAOx3, no distress HEENT: No JVD CVS: S1-S2, regular rhythm, systolic murmur Lungs: Improved air movement, no rales Abdomen: Soft, nontender, bowel sounds present Extremities: No edema Skin: No rashes Psychiatry:  Mood & affect appropriate.    Discharge Instructions   Discharge Instructions     Diet - low sodium heart healthy   Complete by: As directed    Increase activity slowly   Complete by: As directed       Allergies as of 11/03/2021       Reactions  Aldactone [spironolactone] Other (See Comments)   dyspnea   Amoxicillin Palpitations   Tachycardia Has patient had a PCN reaction causing immediate rash, facial/tongue/throat swelling, SOB or lightheadedness with hypotension: no Has patient had a PCN reaction causing severe rash involving mucus  membranes or skin necrosis: {no Has patient had a PCN reaction that required hospitalization {no Has patient had a PCN reaction occurring within the last 10 years: {yes If all of the above answers are "NO", then may proceed with Cephalosporin use.   Diltiazem Other (See Comments)   Causing headaches   Flagyl [metronidazole Hcl] Other (See Comments)   Causing headaches   Flovent [fluticasone Propionate] Other (See Comments)   Leg cramps   Gabapentin Swelling   Lyrica [pregabalin] Swelling   Quinidine Diarrhea, Other (See Comments)   Fever diarrhea   Simvastatin Other (See Comments)   Leg pain, myalgia   Tramadol Nausea Only   Verapamil Other (See Comments)   myalgias   Amlodipine    Low extremity edema   Crestor [rosuvastatin]    myalgia   Livalo [pitavastatin]    myalgias   Zetia [ezetimibe]    LEG CRAMPS   Ace Inhibitors Other (See Comments)   unknown   Benazepril Hcl Cough   Ciprocin-fluocin-procin [fluocinolone Acetonide] Other (See Comments)   unknown   Ciprofloxacin Diarrhea   Codeine Nausea Only   Nitrofurantoin Monohyd Macro Nausea Only        Medication List     TAKE these medications    acetaminophen 500 MG tablet Commonly known as: TYLENOL Take 1,000 mg by mouth every 6 (six) hours as needed for mild pain.   albuterol 108 (90 Base) MCG/ACT inhaler Commonly known as: VENTOLIN HFA Inhale 2 puffs into the lungs every 4 (four) hours as needed for wheezing or shortness of breath.   cephALEXin 250 MG capsule Commonly known as: KEFLEX Take 1 capsule (250 mg total) by mouth 3 (three) times daily for 3 days. What changed: when to take this   cholecalciferol 25 MCG (1000 UNIT) tablet Commonly known as: VITAMIN D3 Take 1 tablet (1,000 Units total) by mouth daily.   diazepam 5 MG tablet Commonly known as: VALIUM TAKE ONE TABLET AT BEDTIME   doxazosin 8 MG tablet Commonly known as: CARDURA TAKE 1 TABLET DAILY   HAIR/SKIN/NAILS/BIOTIN PO Take 1 tablet  by mouth daily.   Multivitamin Adult Extra C Chew Chew 2 tablets by mouth daily.   HYDROcodone-acetaminophen 5-325 MG tablet Commonly known as: NORCO/VICODIN Take 1 tablet by mouth 3 (three) times daily as needed for moderate pain.   metoprolol succinate 25 MG 24 hr tablet Commonly known as: TOPROL-XL TAKE 1 TABLET DAILY   ondansetron 4 MG tablet Commonly known as: Zofran Take 1 tablet (4 mg total) by mouth every 8 (eight) hours as needed for nausea or vomiting.   potassium chloride 10 MEQ tablet Commonly known as: KLOR-CON TAKE UP TO 8 TABLETS DAILY AS DIRECTED What changed:  how much to take how to take this when to take this additional instructions   predniSONE 10 MG tablet Commonly known as: DELTASONE Take 2 tablets (20 mg total) by mouth daily with breakfast for 2 days. Start taking on: November 04, 2021   rosuvastatin 5 MG tablet Commonly known as: CRESTOR Take one tablet every Monday, wednesday Friday and Sunday What changed:  how much to take how to take this when to take this additional instructions   senna 8.6 MG Tabs tablet Commonly known as:  SENOKOT Take 1 tablet (8.6 mg total) by mouth daily as needed for mild constipation.   Symbicort 80-4.5 MCG/ACT inhaler Generic drug: budesonide-formoterol INHALE 2 PUFFS TWICE DAILY What changed:  how much to take when to take this additional instructions   torsemide 20 MG tablet Commonly known as: DEMADEX Take 2 tablets (40mg  total) twice daily. You may take an extra tablet for a total dose of 3 tablets (60mg  total) twice daily as needed for weight gain or swelling. What changed:  how much to take how to take this when to take this additional instructions   triamcinolone cream 0.1 % Commonly known as: KENALOG Apply 1 application topically 2 (two) times daily. What changed:  when to take this reasons to take this   warfarin 2 MG tablet Commonly known as: COUMADIN Take as directed. If you are unsure  how to take this medication, talk to your nurse or doctor. Original instructions: TAKE ONE TABLET DAILY BY MOUTH EXCEPT TAKE 1/2 TABLET ON WEDNESDAYS AND SATURDAYS OR AS DIRECTED BY ANTICOAGULATION CLINIC What changed:  how much to take how to take this when to take this additional instructions       Allergies  Allergen Reactions   Aldactone [Spironolactone] Other (See Comments)    dyspnea   Amoxicillin Palpitations    Tachycardia Has patient had a PCN reaction causing immediate rash, facial/tongue/throat swelling, SOB or lightheadedness with hypotension: no Has patient had a PCN reaction causing severe rash involving mucus membranes or skin necrosis: {no Has patient had a PCN reaction that required hospitalization {no Has patient had a PCN reaction occurring within the last 10 years: {yes If all of the above answers are "NO", then may proceed with Cephalosporin use.   Diltiazem Other (See Comments)    Causing headaches    Flagyl [Metronidazole Hcl] Other (See Comments)    Causing headaches     Flovent [Fluticasone Propionate] Other (See Comments)    Leg cramps   Gabapentin Swelling   Lyrica [Pregabalin] Swelling   Quinidine Diarrhea and Other (See Comments)    Fever diarrhea   Simvastatin Other (See Comments)    Leg pain, myalgia   Tramadol Nausea Only   Verapamil Other (See Comments)    myalgias   Amlodipine     Low extremity edema   Crestor [Rosuvastatin]     myalgia   Livalo [Pitavastatin]     myalgias   Zetia [Ezetimibe]     LEG CRAMPS    Ace Inhibitors Other (See Comments)    unknown   Benazepril Hcl Cough   Ciprocin-Fluocin-Procin [Fluocinolone Acetonide] Other (See Comments)    unknown   Ciprofloxacin Diarrhea   Codeine Nausea Only   Nitrofurantoin Monohyd Macro Nausea Only      The results of significant diagnostics from this hospitalization (including imaging, microbiology, ancillary and laboratory) are listed below for reference.     Significant Diagnostic Studies: DG Chest 2 View  Result Date: 11/02/2021 CLINICAL DATA:  RIGHT side chest wall pain and RIGHT-side upper back pain, onset of pain while twisting and bending last night to pick something up; history hypertension, CHF, asthma, COPD, atrial fibrillation EXAM: CHEST - 2 VIEW COMPARISON:  10/29/2021 FINDINGS: Enlargement of cardiac silhouette post CABG and MVR. Slight pulmonary vascular congestion. Atherosclerotic calcification aorta. Bibasilar atelectasis and small pleural effusions greater on RIGHT. Upper lungs clear. No infiltrate or pneumothorax. Bones demineralized with prior cervical fusion and again seen lower thoracic compression deformity at approximately T11. IMPRESSION: Enlargement of cardiac  silhouette with pulmonary vascular congestion post CABG and MVR. Mild bibasilar atelectasis and small pleural effusions greater on RIGHT. Known T11 compression fracture. Aortic Atherosclerosis (ICD10-I70.0). Electronically Signed   By: Lavonia Dana M.D.   On: 11/02/2021 15:45   DG Chest 2 View  Result Date: 10/29/2021 CLINICAL DATA:  Dyspnea, fever, nausea EXAM: CHEST - 2 VIEW COMPARISON:  10/25/2021 FINDINGS: Lungs are clear. No pneumothorax or pleural effusion. Coronary artery bypass grafting has been performed. Cardiac size within normal limits. Pulmonary vascularity is normal. No acute bone abnormality. IMPRESSION: No active cardiopulmonary disease. Electronically Signed   By: Fidela Salisbury M.D.   On: 10/29/2021 18:17   DG Thoracic Spine W/Swimmers  Result Date: 11/02/2021 CLINICAL DATA:  RIGHT side chest wall pain and RIGHT-side upper back pain, onset of pain while twisting and bending last night to pick something up EXAM: THORACIC SPINE - 3 VIEWS COMPARISON:  CT angio chest 10/29/2021 FINDINGS: Osseous demineralization. Mild superior endplate compression deformity of T11 vertebral body, unchanged from recent CT. No additional fracture, subluxation or bone destruction.  Prior cervical fusion. IMPRESSION: Unchanged appearance of superior endplate compression fracture of T11. No acute osseous abnormalities. Electronically Signed   By: Lavonia Dana M.D.   On: 11/02/2021 15:46   CT Angio Chest PE W and/or Wo Contrast  Result Date: 10/29/2021 CLINICAL DATA:  Fever and shortness of breath EXAM: CT ANGIOGRAPHY CHEST WITH CONTRAST TECHNIQUE: Multidetector CT imaging of the chest was performed using the standard protocol during bolus administration of intravenous contrast. Multiplanar CT image reconstructions and MIPs were obtained to evaluate the vascular anatomy. RADIATION DOSE REDUCTION: This exam was performed according to the departmental dose-optimization program which includes automated exposure control, adjustment of the mA and/or kV according to patient size and/or use of iterative reconstruction technique. CONTRAST:  83 mL OMNIPAQUE IOHEXOL 350 MG/ML SOLN COMPARISON:  Chest x-ray from earlier in the same day. FINDINGS: Cardiovascular: Atherosclerotic calcifications of the thoracic aorta are noted. Changes consistent with prior coronary bypass grafting are noted. No aneurysmal dilatation is seen. Heart is at the upper limits of normal in size. The pulmonary artery shows a normal branching pattern. No intraluminal filling defect to suggest pulmonary embolism is seen. Mild coronary calcifications are noted. Mediastinum/Nodes: Thoracic inlet is within normal limits. No sizable hilar or mediastinal adenopathy is noted. The esophagus as visualized is within normal limits. Lungs/Pleura: Mild right middle lobe atelectatic changes are noted. Small pleural effusions are noted bilaterally. No sizable parenchymal nodules are seen. No pneumothorax is noted. Upper Abdomen: Visualized upper abdomen is within normal limits. Musculoskeletal: Postsurgical changes in the cervical spine are seen. No acute bony abnormality is noted. Review of the MIP images confirms the above findings. IMPRESSION:  No evidence of pulmonary emboli. Right middle lobe atelectatic changes. Small pleural effusions are seen. Aortic Atherosclerosis (ICD10-I70.0). Electronically Signed   By: Inez Catalina M.D.   On: 10/29/2021 21:01   DG Chest Port 1 View  Result Date: 10/25/2021 CLINICAL DATA:  Short of breath. EXAM: PORTABLE CHEST 1 VIEW COMPARISON:  07/09/2020 FINDINGS: Stable changes from previous CABG surgery. Cardiac silhouette normal in size. No mediastinal or hilar masses. Bilateral interstitial thickening, unchanged from the prior study. No lung consolidation. No convincing pleural effusion and no pneumothorax. Previous anterior cervical spine fusion. Skeletal structures are grossly intact. IMPRESSION: 1. No acute cardiopulmonary disease. 2. Irregularly thickened interstitial markings that are unchanged from the prior exam, therefore presumed chronic. Electronically Signed   By: Dedra Skeens.D.  On: 10/25/2021 13:49   ECHOCARDIOGRAM COMPLETE  Result Date: 10/30/2021    ECHOCARDIOGRAM REPORT   Patient Name:   Catherine [name] Date of Exam: 10/30/2021 Medical Rec #:  [id number]      Height:       63.0 in Accession #:    [id number]     Weight:       148.5 lb Date of Birth:  June 27, 1935     BSA:          1.704 m Patient Age:    24 years       BP:           126/76 mmHg Patient Gender: F              HR:           67 bpm. Exam Location:  Inpatient Procedure: 2D Echo, Cardiac Doppler and Color Doppler Indications:    Dyspnea  History:        Patient has prior history of Echocardiogram examinations. CAD,                 COPD, Aortic Valve Disease and Mitral Valve Disease,                 Arrythmias:Atrial Fibrillation, Signs/Symptoms:Murmur; Risk                 Factors:Hypertension. S/p mitral valve replacement. Status post                 Mitral Valve Replacement-29 mm Medtronic Mosaic porcine                 bioprosthetic. CKD. History of rheumatic fever.  Sonographer:    Clayton Lefort RDCS (AE) Referring Phys: [place]  1. Left ventricular ejection fraction, by estimation, is 60 to 65%. The left ventricle has normal function. The left ventricle has no regional wall motion abnormalities. Left ventricular diastolic parameters are indeterminate.  2. Right ventricular systolic function is mildly reduced. The right ventricular size is mildly enlarged. There is moderately elevated pulmonary artery systolic pressure.  3. Left atrial size was moderately dilated.  4. Right atrial size was moderately dilated.  5. Normal appearing bioprosthetic MVR no PVL low mean gradient at HR 58 bpm Stent struts protrude into LVOT but no obvious sub valvular gradients . The mitral valve has been repaired/replaced. No evidence of mitral valve regurgitation. No evidence of mitral stenosis.  6. Tricuspid valve regurgitation is moderate.  7. The aortic valve is tricuspid. There is moderate calcification of the aortic valve. There is moderate thickening of the aortic valve. Aortic valve regurgitation is not visualized. Mild aortic valve stenosis.  8. The inferior vena cava is normal in size with greater than 50% respiratory variability, suggesting right atrial pressure of 3 mmHg. FINDINGS  Left Ventricle: Left ventricular ejection fraction, by estimation, is 60 to 65%. The left ventricle has normal function. The left ventricle has no regional wall motion abnormalities. The left ventricular internal cavity size was normal in size. There is  no left ventricular hypertrophy. Left ventricular diastolic parameters are indeterminate. Right Ventricle: The right ventricular size is mildly enlarged. No increase in right ventricular wall thickness. Right ventricular systolic function is mildly reduced. There is moderately elevated pulmonary artery systolic pressure. The tricuspid regurgitant velocity is 3.29 m/s, and with an assumed right atrial pressure of 15 mmHg, the estimated right ventricular systolic pressure is 17.4 mmHg. Left Atrium: Left  atrial size  was moderately dilated. Right Atrium: Right atrial size was moderately dilated. Pericardium: There is no evidence of pericardial effusion. Mitral Valve: Normal appearing bioprosthetic MVR no PVL low mean gradient at HR 58 bpm Stent struts protrude into LVOT but no obvious sub valvular gradients. The mitral valve has been repaired/replaced. No evidence of mitral valve regurgitation. No evidence of mitral valve stenosis. MV peak gradient, 15.4 mmHg. The mean mitral valve gradient is 4.0 mmHg. Tricuspid Valve: The tricuspid valve is normal in structure. Tricuspid valve regurgitation is moderate . No evidence of tricuspid stenosis. Aortic Valve: The aortic valve is tricuspid. There is moderate calcification of the aortic valve. There is moderate thickening of the aortic valve. Aortic valve regurgitation is not visualized. Mild aortic stenosis is present. Aortic valve mean gradient measures 13.0 mmHg. Aortic valve peak gradient measures 24.2 mmHg. Pulmonic Valve: The pulmonic valve was normal in structure. Pulmonic valve regurgitation is mild. No evidence of pulmonic stenosis. Aorta: The aortic root is normal in size and structure. Venous: The inferior vena cava is normal in size with greater than 50% respiratory variability, suggesting right atrial pressure of 3 mmHg. IAS/Shunts: No atrial level shunt detected by color flow Doppler.  LEFT VENTRICLE PLAX 2D LVIDd:         4.30 cm LVIDs:         2.90 cm LV PW:         1.20 cm LV IVS:        0.90 cm LVOT diam:     1.70 cm LVOT Area:     2.27 cm  RIGHT VENTRICLE             IVC RV Basal diam:  5.10 cm     IVC diam: 2.20 cm RV Mid diam:    3.60 cm RV S prime:     10.60 cm/s TAPSE (M-mode): 1.1 cm LEFT ATRIUM             Index        RIGHT ATRIUM           Index LA diam:        4.40 cm 2.58 cm/m   RA Area:     20.60 cm LA Vol (A2C):   87.9 ml 51.59 ml/m  RA Volume:   58.20 ml  34.16 ml/m LA Vol (A4C):   96.1 ml 56.40 ml/m LA Biplane Vol: 98.1 ml 57.58 ml/m   AORTIC VALVE AV Vmax:      246.00 cm/s AV Vmean:     167.000 cm/s AV VTI:       0.521 m AV Peak Grad: 24.2 mmHg AV Mean Grad: 13.0 mmHg  AORTA Ao Root diam: 3.10 cm Ao Asc diam:  3.30 cm MITRAL VALVE              TRICUSPID VALVE MV Area (PHT): 1.98 cm   TR Peak grad:   43.3 mmHg MV Peak grad:  15.4 mmHg  TR Vmax:        329.00 cm/s MV Mean grad:  4.0 mmHg MV Vmax:       1.96 m/s   SHUNTS MV Vmean:      83.5 cm/s  Systemic Diam: 1.70 cm Jenkins Rouge MD Electronically signed by Jenkins Rouge MD Signature Date/Time: 10/30/2021/11:59:32 AM    Final     Microbiology: Recent Results (from the past 240 hour(s))  Resp Panel by RT-PCR (Flu A&B, Covid) Nasopharyngeal Swab     Status: None   Collection Time:  10/25/21  3:07 PM   Specimen: Nasopharyngeal Swab; Nasopharyngeal(NP) swabs in vial transport medium  Result Value Ref Range Status   SARS Coronavirus 2 by RT PCR NEGATIVE NEGATIVE Final    Comment: (NOTE) SARS-CoV-2 target nucleic acids are NOT DETECTED.  The SARS-CoV-2 RNA is generally detectable in upper respiratory specimens during the acute phase of infection. The lowest concentration of SARS-CoV-2 viral copies this assay can detect is 138 copies/mL. A negative result does not preclude SARS-Cov-2 infection and should not be used as the sole basis for treatment or other patient management decisions. A negative result may occur with  improper specimen collection/handling, submission of specimen other than nasopharyngeal swab, presence of viral mutation(s) within the areas targeted by this assay, and inadequate number of viral copies(<138 copies/mL). A negative result must be combined with clinical observations, patient history, and epidemiological information. The expected result is Negative.  Fact Sheet for Patients:  EntrepreneurPulse.com.au  Fact Sheet for Healthcare Providers:  IncredibleEmployment.be  This test is no t yet approved or cleared by the  [place] FDA and  has been authorized for detection and/or diagnosis of SARS-CoV-2 by FDA under an Emergency Use Authorization (EUA). This EUA will remain  in effect (meaning this test can be used) for the duration of the COVID-19 declaration under Section 564(b)(1) of the Act, 21 U.S.C.section 360bbb-3(b)(1), unless the authorization is terminated  or revoked sooner.       Influenza A by PCR NEGATIVE NEGATIVE Final   Influenza B by PCR NEGATIVE NEGATIVE Final    Comment: (NOTE) The Xpert Xpress SARS-CoV-2/FLU/RSV plus assay is intended as an aid in the diagnosis of influenza from Nasopharyngeal swab specimens and should not be used as a sole basis for treatment. Nasal washings and aspirates are unacceptable for Xpert Xpress SARS-CoV-2/FLU/RSV testing.  Fact Sheet for Patients: EntrepreneurPulse.com.au  Fact Sheet for Healthcare Providers: IncredibleEmployment.be  This test is not yet approved or cleared by the [place] FDA and has been authorized for detection and/or diagnosis of SARS-CoV-2 by FDA under an Emergency Use Authorization (EUA). This EUA will remain in effect (meaning this test can be used) for the duration of the COVID-19 declaration under Section 564(b)(1) of the Act, 21 U.S.C. section 360bbb-3(b)(1), unless the authorization is terminated or revoked.  Performed at [facility], [address] [place], [place] [address]   Resp Panel by RT-PCR (Flu A&B, Covid) Nasopharyngeal Swab     Status: None   Collection Time: 10/29/21  7:30 PM   Specimen: Nasopharyngeal Swab; Nasopharyngeal(NP) swabs in vial transport medium  Result Value Ref Range Status   SARS Coronavirus 2 by RT PCR NEGATIVE NEGATIVE Final    Comment: (NOTE) SARS-CoV-2 target nucleic acids are NOT DETECTED.  The SARS-CoV-2 RNA is generally detectable in upper respiratory specimens during the acute phase of infection. The  lowest concentration of SARS-CoV-2 viral copies this assay can detect is 138 copies/mL. A negative result does not preclude SARS-Cov-2 infection and should not be used as the sole basis for treatment or other patient management decisions. A negative result may occur with  improper specimen collection/handling, submission of specimen other than nasopharyngeal swab, presence of viral mutation(s) within the areas targeted by this assay, and inadequate number of viral copies(<138 copies/mL). A negative result must be combined with clinical observations, patient history, and epidemiological information. The expected result is Negative.  Fact Sheet for Patients:  EntrepreneurPulse.com.au  Fact Sheet for Healthcare Providers:  IncredibleEmployment.be  This test is no t  yet approved or cleared by the [place] and  has been authorized for detection and/or diagnosis of SARS-CoV-2 by FDA under an Emergency Use Authorization (EUA). This EUA will remain  in effect (meaning this test can be used) for the duration of the COVID-19 declaration under Section 564(b)(1) of the Act, 21 U.S.C.section 360bbb-3(b)(1), unless the authorization is terminated  or revoked sooner.       Influenza A by PCR NEGATIVE NEGATIVE Final   Influenza B by PCR NEGATIVE NEGATIVE Final    Comment: (NOTE) The Xpert Xpress SARS-CoV-2/FLU/RSV plus assay is intended as an aid in the diagnosis of influenza from Nasopharyngeal swab specimens and should not be used as a sole basis for treatment. Nasal washings and aspirates are unacceptable for Xpert Xpress SARS-CoV-2/FLU/RSV testing.  Fact Sheet for Patients: EntrepreneurPulse.com.au  Fact Sheet for Healthcare Providers: IncredibleEmployment.be  This test is not yet approved or cleared by the [place] FDA and has been authorized for detection and/or diagnosis of SARS-CoV-2 by FDA under  an Emergency Use Authorization (EUA). This EUA will remain in effect (meaning this test can be used) for the duration of the COVID-19 declaration under Section 564(b)(1) of the Act, 21 U.S.C. section 360bbb-3(b)(1), unless the authorization is terminated or revoked.  Performed at [facility], [address], [place], [place] [address]   Urine Culture     Status: Abnormal   Collection Time: 11/01/21 10:45 AM   Specimen: Urine, Clean Catch  Result Value Ref Range Status   Specimen Description URINE, CLEAN CATCH  Final   Special Requests   Final    NONE Performed at [facility], [place] [address] [place], [place] [address]    Culture >=100,000 COLONIES/mL ESCHERICHIA COLI (A)  Final   Report Status 11/03/2021 FINAL  Final   Organism ID, Bacteria ESCHERICHIA COLI (A)  Final      Susceptibility   Escherichia coli - MIC*    AMPICILLIN >=32 RESISTANT Resistant     CEFAZOLIN 8 SENSITIVE Sensitive     CEFEPIME <=0.12 SENSITIVE Sensitive     CEFTRIAXONE 0.5 SENSITIVE Sensitive     CIPROFLOXACIN <=0.25 SENSITIVE Sensitive     GENTAMICIN <=1 SENSITIVE Sensitive     IMIPENEM <=0.25 SENSITIVE Sensitive     NITROFURANTOIN <=16 SENSITIVE Sensitive     TRIMETH/SULFA <=20 SENSITIVE Sensitive     AMPICILLIN/SULBACTAM >=32 RESISTANT Resistant     PIP/TAZO <=4 SENSITIVE Sensitive     * >=100,000 COLONIES/mL ESCHERICHIA COLI     Labs: Basic Metabolic Panel: Recent Labs  Lab 10/29/21 1920 10/31/21 0320 11/01/21 0325 11/03/21 0321 11/03/21 1353  NA 143 139 140 139 141  K 3.6 3.8 3.4* 2.5* 3.4*  CL 104 103 104 100 103  CO2 30 26 26 28 26   GLUCOSE 107* 155* 137* 81 134*  BUN 14 14 18 20 21   CREATININE 0.89 0.91 0.98 0.71 1.00  CALCIUM 8.9 9.1 9.1 8.4* 8.8*   Liver Function Tests: Recent Labs  Lab 10/29/21 1920  AST 20  ALT 17  ALKPHOS 71  BILITOT 0.9  PROT 6.7  ALBUMIN 3.7   Recent Labs  Lab 10/29/21 1920  LIPASE 23   No results for input(s):  AMMONIA in the last 168 hours. CBC: Recent Labs  Lab 10/29/21 1920 10/31/21 0320 11/03/21 0321  WBC 7.8 9.3 12.9*  NEUTROABS 5.8  --   --   HGB 11.5* 11.5* 12.4  HCT 34.9* 32.8* 36.2  MCV 92.8 91.1 88.9  PLT 150 157 201   Cardiac Enzymes: No results for input(s): CKTOTAL, CKMB, CKMBINDEX, TROPONINI in the last 168 hours. BNP: BNP (last 3 results) Recent Labs    05/07/21 1129 10/25/21 1303 10/29/21 1920  BNP 143.1* 250.6* 234.5*    ProBNP (last 3 results) No results for input(s): PROBNP in the last 8760 hours.  CBG: No results for input(s): GLUCAP in the last 168 hours.     Signed:  Domenic Polite MD.  [facility] 11/03/2021, 3:56 PM

## 2021-11-03 NOTE — Plan of Care (Signed)
°  Problem: Education: Goal: Knowledge of disease or condition will improve Outcome: Adequate for Discharge   Problem: Education: Goal: Knowledge of the prescribed therapeutic regimen will improve Outcome: Adequate for Discharge   Problem: Education: Goal: Individualized Educational Video(s) Outcome: Adequate for Discharge   Problem: Activity: Goal: Will verbalize the importance of balancing activity with adequate rest periods Outcome: Adequate for Discharge   Problem: Respiratory: Goal: Ability to maintain a clear airway will improve Outcome: Adequate for Discharge   Problem: Respiratory: Goal: Levels of oxygenation will improve Outcome: Adequate for Discharge   Problem: Respiratory: Goal: Ability to maintain adequate ventilation will improve Outcome: Adequate for Discharge   Problem: Clinical Measurements: Goal: Will remain free from infection Outcome: Adequate for Discharge   Problem: Clinical Measurements: Goal: Will remain free from infection Outcome: Adequate for Discharge   Problem: Clinical Measurements: Goal: Diagnostic test results will improve Outcome: Adequate for Discharge   Problem: Clinical Measurements: Goal: Cardiovascular complication will be avoided Outcome: Adequate for Discharge   Problem: Elimination: Goal: Will not experience complications related to bowel motility Outcome: Adequate for Discharge   Problem: Education: Goal: Ability to demonstrate management of disease process will improve Outcome: Adequate for Discharge   Problem: Education: Goal: Ability to verbalize understanding of medication therapies will improve Outcome: Adequate for Discharge   Problem: Education: Goal: Individualized Educational Video(s) Outcome: Adequate for Discharge   Problem: Activity: Goal: Capacity to carry out activities will improve Outcome: Adequate for Discharge   Problem: Cardiac: Goal: Ability to achieve and maintain adequate cardiopulmonary  perfusion will improve Outcome: Adequate for Discharge   Problem: Education: Goal: Knowledge of disease or condition will improve Outcome: Adequate for Discharge   Problem: Education: Goal: Understanding of medication regimen will improve Outcome: Adequate for Discharge   Problem: Education: Goal: Individualized Educational Video(s) Outcome: Adequate for Discharge   Problem: Activity: Goal: Ability to tolerate increased activity will improve Outcome: Adequate for Discharge   Problem: Cardiac: Goal: Ability to achieve and maintain adequate cardiopulmonary perfusion will improve Outcome: Adequate for Discharge   Problem: Health Behavior/Discharge Planning: Goal: Ability to safely manage health-related needs after discharge will improve Outcome: Adequate for Discharge

## 2021-11-03 NOTE — Progress Notes (Signed)
Patient d/c home today. IV and telemetry removed. Discharge instructions explained and given to patient.

## 2021-11-03 NOTE — Progress Notes (Addendum)
ANTICOAGULATION CONSULT NOTE - Follow Up Consult  Pharmacy Consult for Warfarin Indication: atrial fibrillation   Allergies  Allergen Reactions   Aldactone [Spironolactone] Other (See Comments)    dyspnea   Amoxicillin Palpitations    Tachycardia Has patient had a PCN reaction causing immediate rash, facial/tongue/throat swelling, SOB or lightheadedness with hypotension: no Has patient had a PCN reaction causing severe rash involving mucus membranes or skin necrosis: {no Has patient had a PCN reaction that required hospitalization {no Has patient had a PCN reaction occurring within the last 10 years: {yes If all of the above answers are "NO", then may proceed with Cephalosporin use.   Diltiazem Other (See Comments)    Causing headaches    Flagyl [Metronidazole Hcl] Other (See Comments)    Causing headaches     Flovent [Fluticasone Propionate] Other (See Comments)    Leg cramps   Gabapentin Swelling   Lyrica [Pregabalin] Swelling   Quinidine Diarrhea and Other (See Comments)    Fever diarrhea   Simvastatin Other (See Comments)    Leg pain, myalgia   Tramadol Nausea Only   Verapamil Other (See Comments)    myalgias   Amlodipine     Low extremity edema   Crestor [Rosuvastatin]     myalgia   Livalo [Pitavastatin]     myalgias   Zetia [Ezetimibe]     LEG CRAMPS    Ace Inhibitors Other (See Comments)    unknown   Benazepril Hcl Cough   Ciprocin-Fluocin-Procin [Fluocinolone Acetonide] Other (See Comments)    unknown   Ciprofloxacin Diarrhea   Codeine Nausea Only   Nitrofurantoin Monohyd Macro Nausea Only    Patient Measurements: Height: 5\' 3"  (160 cm) Weight: 65.8 kg (145 lb) IBW/kg (Calculated) : 52.4  Vital Signs: Temp: 98.8 F (37.1 C) (02/07 0335) Temp Source: Oral (02/07 0335) BP: 158/58 (02/07 0335) Pulse Rate: 64 (02/07 0335)  Labs: Recent Labs    11/01/21 0325 11/02/21 0233 11/03/21 0321  HGB  --   --  12.4  HCT  --   --  36.2  PLT  --   --   201  LABPROT 27.2* 23.9* 25.0*  INR 2.5* 2.1* 2.3*  CREATININE 0.98  --  0.71    Estimated Creatinine Clearance: 47.8 mL/min (by C-G formula based on SCr of 0.71 mg/dL).  Assessment: 87 year old female on warfarin PTA for afib for SOB. Patient has RHD with history of bio-MVR. INR on admission 3.5. Last dose PTA was on 2/1. No dose given on 2/2-3.  INR 2.3. PO intake ~50%.   Interacting medications: azithromycin started 2/3, doxycycline x 1 on 2/2  PTA warfarin dose: 1 mg on Wed and Sat, 2 mg all other days  Goal of Therapy:  INR 2-3 Monitor platelets by anticoagulation protocol: Yes   Plan:  Schedule home warfarin 2mg  daily except 1mg  on Wed/Sat F/u Mon/Thr INR, Q Mon CBC   Monitor for signs/symptoms of bleeding     Benetta Spar, PharmD, BCPS, BCCP Clinical Pharmacist  Please check AMION for all [facility] phone numbers After 10:00 PM, call [place]

## 2021-11-04 ENCOUNTER — Telehealth: Payer: Self-pay

## 2021-11-04 NOTE — Telephone Encounter (Signed)
Transition Care Management Follow-up Telephone Call Date of discharge and from where: [facility] 11-03-21 Dx: acute respiratory failure with hypoxia How have you been since you were released from the hospital? Doing ok - would like to feel better  Any questions or concerns? No  Items Reviewed: Did the pt receive and understand the discharge instructions provided? Yes  Medications obtained and verified? Yes  Other? No  Any new allergies since your discharge? No  Dietary orders reviewed? Yes Do you have support at home? Yes   Home Care and Equipment/Supplies: Were home health services ordered? No- but patient would like some PT for strengthening  If so, what is the name of the agency? na  Has the agency set up a time to come to the patient's home? not applicable Were any new equipment or medical supplies ordered?  No What is the name of the medical supply agency? na Were you able to get the supplies/equipment? not applicable Do you have any questions related to the use of the equipment or supplies? No  Functional Questionnaire: (I = Independent and D = Dependent) ADLs: I  Bathing/Dressing- I  Meal Prep- I  Eating- I  Maintaining continence- I  Transferring/Ambulation- I  Managing Meds- I  Follow up appointments reviewed:  PCP Hospital f/u appt confirmed? Yes  Scheduled to see Dr Elease Hashimoto  on 11-10-21 @ [facility] f/u appt confirmed? Yes  Scheduled to see Dr Oval Linsey on 11-19-21 @ 940am. Are transportation arrangements needed? No  If their condition worsens, is the pt aware to call PCP or go to the Emergency Dept.? Yes Was the patient provided with contact information for the PCP's office or ED? Yes Was to pt encouraged to call back with questions or concerns? Yes

## 2021-11-05 ENCOUNTER — Other Ambulatory Visit: Payer: Self-pay | Admitting: Family Medicine

## 2021-11-05 NOTE — Telephone Encounter (Signed)
Last filled 05/11/2021 Last OV 07/14/2021  Ok to fill?

## 2021-11-06 ENCOUNTER — Ambulatory Visit (INDEPENDENT_AMBULATORY_CARE_PROVIDER_SITE_OTHER): Payer: Medicare Other

## 2021-11-06 DIAGNOSIS — I2510 Atherosclerotic heart disease of native coronary artery without angina pectoris: Secondary | ICD-10-CM

## 2021-11-06 DIAGNOSIS — I10 Essential (primary) hypertension: Secondary | ICD-10-CM

## 2021-11-06 DIAGNOSIS — I4891 Unspecified atrial fibrillation: Secondary | ICD-10-CM

## 2021-11-06 DIAGNOSIS — I5033 Acute on chronic diastolic (congestive) heart failure: Secondary | ICD-10-CM

## 2021-11-06 DIAGNOSIS — J441 Chronic obstructive pulmonary disease with (acute) exacerbation: Secondary | ICD-10-CM

## 2021-11-06 DIAGNOSIS — E785 Hyperlipidemia, unspecified: Secondary | ICD-10-CM

## 2021-11-06 NOTE — Patient Instructions (Signed)
Visit Information   Thank you for taking time to visit with me today. Please don't hesitate to contact me if I can be of assistance to you before our next scheduled telephone appointment.  Following are the goals we discussed today:  Take all medications as prescribed Attend all scheduled provider appointments Call pharmacy for medication refills 3-7 days in advance of running out of medications Call provider office for new concerns or questions  call office if I gain more than 2 pounds in one day or 5 pounds in one week keep legs up while sitting track weight in diary watch for swelling in feet, ankles and legs every day weigh myself daily follow rescue plan if symptoms flare-up eat more whole grains, fruits and vegetables, lean meats and healthy fats identify and remove indoor air pollutants do breathing exercises every day follow rescue plan if symptoms flare-up get at least 7 to 8 hours of sleep at night use devices that will help like a cane, sock-puller or reacher do breathing exercises every day check pulse (heart) rate once a day make a plan to eat healthy keep all lab appointments check blood pressure 3 times per week take blood pressure log to all doctor appointments call doctor for signs and symptoms of high blood pressure limit salt intake to less than 2370m/day call for medicine refill 2 or 3 days before it runs out take all medications exactly as prescribed call doctor with any symptoms you believe are related to your medicine  Heart Failure Action Plan A heart failure action plan helps you understand what to do when you have symptoms of heart failure. Your action plan is a color-coded plan that lists the symptoms to watch for and indicates what actions to take. If you have symptoms in the red zone, you need medical care right away. If you have symptoms in the yellow zone, you are having problems. If you have symptoms in the green zone, you are doing well. Follow  the plan that was created by you and your health care provider. Review your plan each time you visit your health care provider. Red zone These signs and symptoms mean you should get medical help right away: You have trouble breathing when resting. You have a dry cough that is getting worse. You have swelling or pain in your legs or abdomen that is getting worse. You suddenly gain more than 2-3 lb (0.9-1.4 kg) in 24 hours, or more than 5 lb (2.3 kg) in a week. This amount may be more or less depending on your condition. You have trouble staying awake or you feel confused. You have chest pain. You do not have an appetite. You pass out. You have worsening sadness or depression. If you have any of these symptoms, call your local emergency services (911 in the [place] right away. Do not drive yourself to the hospital. Yellow zone These signs and symptoms mean your condition may be getting worse and you should make some changes: You have trouble breathing when you are active, or you need to sleep with your head raised on extra pillows to help you breathe. You have swelling in your legs or abdomen. You gain 2-3 lb (0.9-1.4 kg) in 24 hours, or 5 lb (2.3 kg) in a week. This amount may be more or less depending on your condition. You get tired easily. You have trouble sleeping. You have a dry cough. If you have any of these symptoms: Contact your health care provider within the next day.  Your health care provider may adjust your medicines. Green zone These signs mean you are doing well and can continue what you are doing: You do not have shortness of breath. You have very little swelling or no new swelling. Your weight is stable (no gain or loss). You have a normal activity level. You do not have chest pain or any other new symptoms. Follow these instructions at home: Take over-the-counter and prescription medicines only as told by your health care provider. Weigh yourself daily. Your target  weight is __________ lb (__________ kg). Call your health care provider if you gain more than __________ lb (__________ kg) in 24 hours, or more than __________ lb (__________ kg) in a week. Health care provider name: _____________________________________________________ Health care provider phone number: _____________________________________________________ Eat a heart-healthy diet. Work with a diet and nutrition specialist (dietitian) to create an eating plan that is best for you. Keep all follow-up visits. This is important. Where to find more information American Heart Association: www.heart.org Summary A heart failure action plan helps you understand what to do when you have symptoms of heart failure. Follow the action plan that was created by you and your health care provider. Get help right away if you have any symptoms in the red zone. This information is not intended to replace advice given to you by your health care provider. Make sure you discuss any questions you have with your health care provider. Document Revised: 04/28/2020 Document Reviewed: 04/28/2020 Elsevier Patient Education  2022 [place] next appointment is by telephone on 12/01/21 at 11:30 AM  Please call the care guide team at [phone] if you need to cancel or reschedule your appointment.   If you are experiencing a Mental Health or [place] or need someone to talk to, please call the Suicide and Crisis Lifeline: 988 call the [place] National Suicide Prevention Lifeline: 720 085 3565 or TTY: 8567485789 TTY (657)887-5657) to talk to a trained counselor call 1-800-273-TALK (toll free, 24 hour hotline) call the [facility]: 2501800871 call 911   Following is a copy of your full care plan:  Care Plan : [facility] of Care  Updates made by Dimitri Ped, RN since 11/06/2021 12:00 AM     Problem: Chronic Disease Management and Care Coordination Needs  (CHF,COPD, Atrial Fib, HTN, HLD,CAD)   Priority: High     Long-Range Goal: Establish Plan of Care for Chronic Disease Management Needs (CHF,COPD, Atrial Fib, HTN, HLD,CAD)   Start Date: 11/06/2021  Expected End Date: 11/06/2022  Priority: High  Note:   Current Barriers:  Knowledge Deficits related to plan of care for management of Atrial Fibrillation, CHF, CAD, HTN, HLD, and COPD  Chronic Disease Management support and education needs related to Atrial Fibrillation, CHF, CAD, HTN, HLD, and COPD  States she is feeling a little better every day since coming home from the hospital.  States she still has some discomfort under her rt breast from coughing when she was getting sick.  States she is finishing her antibiotics today and completed her prednisone yesterday.  States she gets short of breath with mild exertion but recovers when she rests.  States she is using her inhalers as ordered and has not needed to use her Albuterol inhaler.  States she is using the incentive spirometer she got in the hospital.  States she is weighting daily and she weighted 141 this morning.  States she tries to not use salt and mostly cooks at home.  States she did go to [employer] twice a week before she got sick and she would like to get strong enough to start back.  RNCM Clinical Goal(s):  Patient will verbalize understanding of plan for management of Atrial Fibrillation, CHF, CAD, HTN, HLD, and COPD as evidenced by voiced adherence to plan of care verbalize basic understanding of  Atrial Fibrillation, CHF, CAD, HTN, HLD, and COPD disease process and self health management plan as evidenced by voiced understanding and teach back take all medications exactly as prescribed and will call provider for medication related questions as evidenced by readings within limits, voiced adherence to plan of care attend all scheduled medical appointments: Dr. Elease Hashimoto 11/10/21, Cardiology 11/19/21, CCM PharmD 12/15/21 as evidenced by  medical records demonstrate Improved adherence to prescribed treatment plan for Atrial Fibrillation, CHF, CAD, HTN, HLD, and COPD as evidenced by readings within limits, voiced adherence to plan of care continue to work with RN Care Manager to address care management and care coordination needs related to  Atrial Fibrillation, CHF, CAD, HTN, HLD, and COPD as evidenced by adherence to CM Team Scheduled appointments work with pharmacist to address polypharmacy and adherence related toAtrial Fibrillation, CHF, CAD, HTN, HLD, and COPD as evidenced by review or EMR and patient or pharmacist report through collaboration with RN Care manager, provider, and care team.   Interventions: 1:1 collaboration with primary care provider regarding development and update of comprehensive plan of care as evidenced by provider attestation and co-signature Inter-disciplinary care team collaboration (see longitudinal plan of care) Evaluation of current treatment plan related to  self management and patient's adherence to plan as established by provider   AFIB Interventions: (Status:  New goal.) Long Term Goal   Counseled on increased risk of stroke due to Afib and benefits of anticoagulation for stroke prevention Reviewed importance of adherence to anticoagulant exactly as prescribed Counseled on bleeding risk associated with Coumadin and importance of self-monitoring for signs/symptoms of bleeding Counseled on importance of regular laboratory monitoring as prescribed Counseled on seeking medical attention after a head injury or if there is blood in the urine/stool   CAD Interventions: (Status:  New goal.) Long Term Goal Assessed understanding of CAD diagnosis Medications reviewed including medications utilized in CAD treatment plan Provided education on importance of blood pressure control in management of CAD Provided education on Importance of limiting foods high in cholesterol Reviewed Importance of taking all  medications as prescribed   Heart Failure Interventions:  (Status:  New goal.) Long Term Goal Basic overview and discussion of pathophysiology of Heart Failure reviewed Provided education on low sodium diet Reviewed Heart Failure Action Plan in depth and provided written copy Assessed need for readable accurate scales in home Discussed importance of daily weight and advised patient to weigh and record daily Reviewed role of diuretics in prevention of fluid overload and management of heart failure; Discussed the importance of keeping all appointments with provider Reviewed s/sx of HF to call provider.  Reviewed to keep legs elevated when sitting  COPD Interventions:  (Status:  New goal.) Long Term Goal Provided patient with basic written and verbal COPD education on self care/management/and exacerbation prevention Advised patient to track and manage COPD triggers Provided instruction about proper use of medications used for management of COPD including inhalers Advised patient to self assesses COPD action plan zone and make appointment with provider if in the yellow zone for 48 hours without improvement Provided education about and advised patient to utilize infection prevention strategies to  reduce risk of respiratory infection Discussed the importance of adequate rest and management of fatigue with COPD   Hyperlipidemia Interventions:  (Status:  New goal.) Long Term Goal Medication review performed; medication list updated in electronic medical record.  Provider established cholesterol goals reviewed Counseled on importance of regular laboratory monitoring as prescribed Reviewed role and benefits of statin for ASCVD risk reduction  Hypertension Interventions:  (Status:  New goal.) Long Term Goal Last practice recorded BP readings:  BP Readings from Last 3 Encounters:  11/03/21 116/68  10/25/21 121/60  07/14/21 134/60  Most recent eGFR/CrCl:  Lab Results  Component Value Date    EGFR 67 05/07/2021    No components found for: CRCL  Evaluation of current treatment plan related to hypertension self management and patient's adherence to plan as established by provider Provided education to patient re: stroke prevention, s/s of heart attack and stroke Reviewed medications with patient and discussed importance of compliance Discussed plans with patient for ongoing care management follow up and provided patient with direct contact information for care management team Advised patient, providing education and rationale, to monitor blood pressure daily and record, calling PCP for findings outside established parameters Provided education on prescribed diet low sodium heart healthy Screening for signs and symptoms of depression related to chronic disease state  Assessed social determinant of health barriers  Patient Goals/Self-Care Activities: Take all medications as prescribed Attend all scheduled provider appointments Call pharmacy for medication refills 3-7 days in advance of running out of medications Call provider office for new concerns or questions  call office if I gain more than 2 pounds in one day or 5 pounds in one week keep legs up while sitting track weight in diary watch for swelling in feet, ankles and legs every day weigh myself daily follow rescue plan if symptoms flare-up eat more whole grains, fruits and vegetables, lean meats and healthy fats identify and remove indoor air pollutants do breathing exercises every day follow rescue plan if symptoms flare-up get at least 7 to 8 hours of sleep at night use devices that will help like a cane, sock-puller or reacher do breathing exercises every day check pulse (heart) rate once a day make a plan to eat healthy keep all lab appointments check blood pressure 3 times per week take blood pressure log to all doctor appointments call doctor for signs and symptoms of high blood pressure limit salt intake to  less than 2318m/day call for medicine refill 2 or 3 days before it runs out take all medications exactly as prescribed call doctor with any symptoms you believe are related to your medicine  Follow Up Plan:  Telephone follow up appointment with care management team member scheduled for:  12/01/21 The patient has been provided with contact information for the care management team and has been advised to call with any health related questions or concerns.       Consent to CCM Services: Ms. HHarlacherwas given information about Chronic Care Management services including:  CCM service includes personalized support from designated clinical staff supervised by her physician, including individualized plan of care and coordination with other care providers 24/7 contact phone numbers for assistance for urgent and routine care needs. Service will only be billed when office clinical staff spend 20 minutes or more in a month to coordinate care. Only one practitioner may furnish and bill the service in a calendar month. The patient may stop CCM services at any time (effective at the end of  the month) by phone call to the office staff. The patient will be responsible for cost sharing (co-pay) of up to 20% of the service fee (after annual deductible is met).  Patient agreed to services and verbal consent obtained.   The patient verbalized understanding of instructions, educational materials, and care plan provided today and agreed to receive a mailed copy of patient instructions, educational materials, and care plan.   Telephone follow up appointment with care management team member scheduled for: 12/01/21 at 11:30 AM Peter Garter RN, Jackquline Denmark, CDE Care Management Coordinator [facility] [phone]

## 2021-11-06 NOTE — Chronic Care Management (AMB) (Signed)
Chronic Care Management   CCM RN Visit Note  11/06/2021 Name: Catherine [name] MRN: [id number] DOB: [birth year]  Subjective: [name] is a 87 y.o. year old female who is a primary care patient of Burchette, Alinda Sierras, MD. The care management team was consulted for assistance with disease management and care coordination needs.    Engaged with patient by telephone for initial visit in response to provider referral for case management and/or care coordination services.   Consent to Services:  The patient was given the following information about Chronic Care Management services today, agreed to services, and gave verbal consent: 1. CCM service includes personalized support from designated clinical staff supervised by the primary care provider, including individualized plan of care and coordination with other care providers 2. 24/7 contact phone numbers for assistance for urgent and routine care needs. 3. Service will only be billed when office clinical staff spend 20 minutes or more in a month to coordinate care. 4. Only one practitioner may furnish and bill the service in a calendar month. 5.The patient may stop CCM services at any time (effective at the end of the month) by phone call to the office staff. 6. The patient will be responsible for cost sharing (co-pay) of up to 20% of the service fee (after annual deductible is met). Patient agreed to services and consent obtained.  Patient agreed to services and verbal consent obtained.   Assessment: Review of patient past medical history, allergies, medications, health status, including review of consultants reports, laboratory and other test data, was performed as part of comprehensive evaluation and provision of chronic care management services.   SDOH (Social Determinants of Health) assessments and interventions performed:  SDOH Interventions    Flowsheet Row Most Recent Value  SDOH Interventions   Food Insecurity Interventions Intervention  Not Indicated  Financial Strain Interventions Intervention Not Indicated  Housing Interventions Intervention Not Indicated  Intimate Partner Violence Interventions Intervention Not Indicated  Physical Activity Interventions Other (Comments)  [reviewed Silver Sneakers when able]  Stress Interventions Intervention Not Indicated  Transportation Interventions Intervention Not Indicated        CCM Care Plan  Allergies  Allergen Reactions   Aldactone [Spironolactone] Other (See Comments)    dyspnea   Amoxicillin Palpitations    Tachycardia Has patient had a PCN reaction causing immediate rash, facial/tongue/throat swelling, SOB or lightheadedness with hypotension: no Has patient had a PCN reaction causing severe rash involving mucus membranes or skin necrosis: {no Has patient had a PCN reaction that required hospitalization {no Has patient had a PCN reaction occurring within the last 10 years: {yes If all of the above answers are "NO", then may proceed with Cephalosporin use.   Diltiazem Other (See Comments)    Causing headaches    Flagyl [Metronidazole Hcl] Other (See Comments)    Causing headaches     Flovent [Fluticasone Propionate] Other (See Comments)    Leg cramps   Gabapentin Swelling   Lyrica [Pregabalin] Swelling   Quinidine Diarrhea and Other (See Comments)    Fever diarrhea   Simvastatin Other (See Comments)    Leg pain, myalgia   Tramadol Nausea Only   Verapamil Other (See Comments)    myalgias   Amlodipine     Low extremity edema   Crestor [Rosuvastatin]     myalgia   Livalo [Pitavastatin]     myalgias   Zetia [Ezetimibe]     LEG CRAMPS    Ace Inhibitors Other (See Comments)  unknown   Benazepril Hcl Cough   Ciprocin-Fluocin-Procin [Fluocinolone Acetonide] Other (See Comments)    unknown   Ciprofloxacin Diarrhea   Codeine Nausea Only   Nitrofurantoin Monohyd Macro Nausea Only    Outpatient Encounter Medications as of 11/06/2021  Medication Sig    acetaminophen (TYLENOL) 500 MG tablet Take 1,000 mg by mouth every 6 (six) hours as needed for mild pain.   albuterol (VENTOLIN HFA) 108 (90 Base) MCG/ACT inhaler Inhale 2 puffs into the lungs every 4 (four) hours as needed for wheezing or shortness of breath.   cephALEXin (KEFLEX) 250 MG capsule Take 1 capsule (250 mg total) by mouth 3 (three) times daily for 3 days.   cholecalciferol (VITAMIN D3) 25 MCG (1000 UNIT) tablet Take 1 tablet (1,000 Units total) by mouth daily.   diazepam (VALIUM) 5 MG tablet TAKE ONE TABLET AT BEDTIME   doxazosin (CARDURA) 8 MG tablet TAKE 1 TABLET DAILY (Patient taking differently: Take 8 mg by mouth daily.)   HYDROcodone-acetaminophen (NORCO/VICODIN) 5-325 MG tablet Take 1 tablet by mouth 3 (three) times daily as needed for moderate pain.   metoprolol succinate (TOPROL-XL) 25 MG 24 hr tablet TAKE 1 TABLET DAILY (Patient taking differently: Take 25 mg by mouth daily.)   Multiple Vitamins-Minerals (HAIR/SKIN/NAILS/BIOTIN PO) Take 1 tablet by mouth daily.   Multiple Vitamins-Minerals (MULTIVITAMIN ADULT EXTRA C) CHEW Chew 2 tablets by mouth daily.   ondansetron (ZOFRAN) 4 MG tablet Take 1 tablet (4 mg total) by mouth every 8 (eight) hours as needed for nausea or vomiting.   potassium chloride (KLOR-CON) 10 MEQ tablet TAKE UP TO 8 TABLETS DAILY AS DIRECTED (Patient taking differently: Take 40 mEq by mouth 2 (two) times daily.)   predniSONE (DELTASONE) 10 MG tablet Take 2 tablets (20 mg total) by mouth daily with breakfast for 2 days.   rosuvastatin (CRESTOR) 5 MG tablet Take one tablet every Monday, wednesday Friday and Sunday (Patient taking differently: Take 5 mg by mouth See admin instructions. 18m Monday's, Wednesday's,  Friday's, and Sunday's)   senna (SENOKOT) 8.6 MG TABS tablet Take 1 tablet (8.6 mg total) by mouth daily as needed for mild constipation.   SYMBICORT 80-4.5 MCG/ACT inhaler INHALE 2 PUFFS TWICE DAILY (Patient taking differently: 2 puffs in the morning  and at bedtime.)   torsemide (DEMADEX) 20 MG tablet Take 2 tablets (468mtotal) twice daily. You may take an extra tablet for a total dose of 3 tablets (6025motal) twice daily as needed for weight gain or swelling. (Patient taking differently: Take 40 mg by mouth 2 (two) times daily. You may take an extra tablet for a total dose of 3 tablets (52m61mtal) twice daily as needed for weight gain or swelling.)   triamcinolone cream (KENALOG) 0.1 % Apply 1 application topically 2 (two) times daily. (Patient taking differently: Apply 1 application topically 2 (two) times daily as needed (rash).)   warfarin (COUMADIN) 2 MG tablet TAKE ONE TABLET DAILY BY MOUTH EXCEPT TAKE 1/2 TABLET ON WEDNESDAYS AND SATURDAYS OR AS DIRECTED BY ANTICOAGULATION CLINIC (Patient taking differently: Take 1-2 mg by mouth See admin instructions. 1mg 53mWednesday's and Saturday's, 2mg o14mll other days of the week in the evening)   No facility-administered encounter medications on file as of 11/06/2021.    Patient Active Problem List   Diagnosis Date Noted   Chest wall pain 11/02/2021   Nausea without vomiting 10/30/2021   Tricuspid regurgitation 07/14/2021   Protein-calorie malnutrition, severe 09/05/2019   Dehydration  Gastroenteritis    Enteritis 09/03/2019   COVID-19 08/27/2019   Dysuria 07/07/2019   Chronic anxiety 12/23/2017   Aortic stenosis, mild 07/26/2017   Dyslipidemia 12/20/2016   S/P mitral valve replacement with bioprosthetic valve + CABG x1 + maze procedure 10/14/2016   S/P Maze operation for atrial fibrillation 10/14/2016   S/P CABG x 1 10/14/2016   Cellulitis of left lower extremity 08/17/2016   Acute on chronic diastolic heart failure (HCC)    Coronary artery disease    Chronic insomnia 07/12/2016   DOE (dyspnea on exertion) 03/26/2015   Warfarin-induced coagulopathy ([place]) 03/16/2015   Valvular heart disease 03/16/2015   COPD with acute exacerbation ([place]) 03/16/2015   Nephrolithiasis 03/16/2015    Acute respiratory failure with hypoxia ([place]) 03/11/2015   Essential hypertension    RHEUMATIC MITRAL STENOSIS 01/05/2008   Rheumatic heart disease 01/05/2008   Atrial fibrillation ([place]) 01/05/2008    Conditions to be addressed/monitored:Atrial Fibrillation, CHF, CAD, HTN, HLD, and COPD  Care Plan : RN Care Manager Plan of Care  Updates made by Dimitri Ped, RN since 11/06/2021 12:00 AM     Problem: Chronic Disease Management and Care Coordination Needs (CHF,COPD, Atrial Fib, HTN, HLD,CAD)   Priority: High     Long-Range Goal: Establish Plan of Care for Chronic Disease Management Needs (CHF,COPD, Atrial Fib, HTN, HLD,CAD)   Start Date: 11/06/2021  Expected End Date: 11/06/2022  Priority: High  Note:   Current Barriers:  Knowledge Deficits related to plan of care for management of Atrial Fibrillation, CHF, CAD, HTN, HLD, and COPD  Chronic Disease Management support and education needs related to Atrial Fibrillation, CHF, CAD, HTN, HLD, and COPD  States she is feeling a little better every day since coming home from the hospital.  States she still has some discomfort under her rt breast from coughing when she was getting sick.  States she is finishing her antibiotics today and completed her prednisone yesterday.  States she gets short of breath with mild exertion but recovers when she rests.  States she is using her inhalers as ordered and has not needed to use her Albuterol inhaler.  States she is using the incentive spirometer she got in the hospital.  States she is weighting daily and she weighted 141 this morning.  States she tries to not use salt and mostly cooks at home.  States she did go to [employer] twice a week before she got sick and she would like to get strong enough to start back.  RNCM Clinical Goal(s):  Patient will verbalize understanding of plan for management of Atrial Fibrillation, CHF, CAD, HTN, HLD, and COPD as evidenced by voiced adherence to plan of  care verbalize basic understanding of  Atrial Fibrillation, CHF, CAD, HTN, HLD, and COPD disease process and self health management plan as evidenced by voiced understanding and teach back take all medications exactly as prescribed and will call provider for medication related questions as evidenced by readings within limits, voiced adherence to plan of care attend all scheduled medical appointments: Dr. Elease Hashimoto 11/10/21, Cardiology 11/19/21, CCM PharmD 12/15/21 as evidenced by medical records demonstrate Improved adherence to prescribed treatment plan for Atrial Fibrillation, CHF, CAD, HTN, HLD, and COPD as evidenced by readings within limits, voiced adherence to plan of care continue to work with RN Care Manager to address care management and care coordination needs related to  Atrial Fibrillation, CHF, CAD, HTN, HLD, and COPD as evidenced by adherence to CM Team Scheduled appointments work with  pharmacist to address polypharmacy and adherence related toAtrial Fibrillation, CHF, CAD, HTN, HLD, and COPD as evidenced by review or EMR and patient or pharmacist report through collaboration with RN Care manager, provider, and care team.   Interventions: 1:1 collaboration with primary care provider regarding development and update of comprehensive plan of care as evidenced by provider attestation and co-signature Inter-disciplinary care team collaboration (see longitudinal plan of care) Evaluation of current treatment plan related to  self management and patient's adherence to plan as established by provider   AFIB Interventions: (Status:  New goal.) Long Term Goal   Counseled on increased risk of stroke due to Afib and benefits of anticoagulation for stroke prevention Reviewed importance of adherence to anticoagulant exactly as prescribed Counseled on bleeding risk associated with Coumadin and importance of self-monitoring for signs/symptoms of bleeding Counseled on importance of regular laboratory  monitoring as prescribed Counseled on seeking medical attention after a head injury or if there is blood in the urine/stool   CAD Interventions: (Status:  New goal.) Long Term Goal Assessed understanding of CAD diagnosis Medications reviewed including medications utilized in CAD treatment plan Provided education on importance of blood pressure control in management of CAD Provided education on Importance of limiting foods high in cholesterol Reviewed Importance of taking all medications as prescribed   Heart Failure Interventions:  (Status:  New goal.) Long Term Goal Basic overview and discussion of pathophysiology of Heart Failure reviewed Provided education on low sodium diet Reviewed Heart Failure Action Plan in depth and provided written copy Assessed need for readable accurate scales in home Discussed importance of daily weight and advised patient to weigh and record daily Reviewed role of diuretics in prevention of fluid overload and management of heart failure; Discussed the importance of keeping all appointments with provider Reviewed s/sx of HF to call provider.  Reviewed to keep legs elevated when sitting  COPD Interventions:  (Status:  New goal.) Long Term Goal Provided patient with basic written and verbal COPD education on self care/management/and exacerbation prevention Advised patient to track and manage COPD triggers Provided instruction about proper use of medications used for management of COPD including inhalers Advised patient to self assesses COPD action plan zone and make appointment with provider if in the yellow zone for 48 hours without improvement Provided education about and advised patient to utilize infection prevention strategies to reduce risk of respiratory infection Discussed the importance of adequate rest and management of fatigue with COPD   Hyperlipidemia Interventions:  (Status:  New goal.) Long Term Goal Medication review performed; medication  list updated in electronic medical record.  Provider established cholesterol goals reviewed Counseled on importance of regular laboratory monitoring as prescribed Reviewed role and benefits of statin for ASCVD risk reduction  Hypertension Interventions:  (Status:  New goal.) Long Term Goal Last practice recorded BP readings:  BP Readings from Last 3 Encounters:  11/03/21 116/68  10/25/21 121/60  07/14/21 134/60  Most recent eGFR/CrCl:  Lab Results  Component Value Date   EGFR 67 05/07/2021    No components found for: CRCL  Evaluation of current treatment plan related to hypertension self management and patient's adherence to plan as established by provider Provided education to patient re: stroke prevention, s/s of heart attack and stroke Reviewed medications with patient and discussed importance of compliance Discussed plans with patient for ongoing care management follow up and provided patient with direct contact information for care management team Advised patient, providing education and rationale, to monitor blood pressure  daily and record, calling PCP for findings outside established parameters Provided education on prescribed diet low sodium heart healthy Screening for signs and symptoms of depression related to chronic disease state  Assessed social determinant of health barriers  Patient Goals/Self-Care Activities: Take all medications as prescribed Attend all scheduled provider appointments Call pharmacy for medication refills 3-7 days in advance of running out of medications Call provider office for new concerns or questions  call office if I gain more than 2 pounds in one day or 5 pounds in one week keep legs up while sitting track weight in diary watch for swelling in feet, ankles and legs every day weigh myself daily follow rescue plan if symptoms flare-up eat more whole grains, fruits and vegetables, lean meats and healthy fats identify and remove indoor air  pollutants do breathing exercises every day follow rescue plan if symptoms flare-up get at least 7 to 8 hours of sleep at night use devices that will help like a cane, sock-puller or reacher do breathing exercises every day check pulse (heart) rate once a day make a plan to eat healthy keep all lab appointments check blood pressure 3 times per week take blood pressure log to all doctor appointments call doctor for signs and symptoms of high blood pressure limit salt intake to less than 2372m/day call for medicine refill 2 or 3 days before it runs out take all medications exactly as prescribed call doctor with any symptoms you believe are related to your medicine  Follow Up Plan:  Telephone follow up appointment with care management team member scheduled for:  12/01/21 The patient has been provided with contact information for the care management team and has been advised to call with any health related questions or concerns.       Plan:Telephone follow up appointment with care management team member scheduled for:  12/01/21 The patient has been provided with contact information for the care management team and has been advised to call with any health related questions or concerns.  MPeter GarterRN, BJackquline Denmark CDE Care Management Coordinator [facility] [phone]

## 2021-11-10 ENCOUNTER — Ambulatory Visit (INDEPENDENT_AMBULATORY_CARE_PROVIDER_SITE_OTHER): Payer: Medicare Other | Admitting: Family Medicine

## 2021-11-12 NOTE — Progress Notes (Signed)
This patient was scheduled to be seen for hospital follow up but was unable to be seen secondary to my personal illness on day of visit.   She will be re-scheduled.

## 2021-11-16 ENCOUNTER — Ambulatory Visit (INDEPENDENT_AMBULATORY_CARE_PROVIDER_SITE_OTHER): Payer: Medicare Other | Admitting: Family Medicine

## 2021-11-16 ENCOUNTER — Telehealth: Payer: Self-pay

## 2021-11-16 ENCOUNTER — Telehealth: Payer: Self-pay | Admitting: Family Medicine

## 2021-11-16 ENCOUNTER — Ambulatory Visit (INDEPENDENT_AMBULATORY_CARE_PROVIDER_SITE_OTHER): Payer: Medicare Other

## 2021-11-16 ENCOUNTER — Encounter: Payer: Self-pay | Admitting: Family Medicine

## 2021-11-16 VITALS — BP 132/58 | HR 73 | Temp 98.3°F | Ht 63.0 in | Wt 144.0 lb

## 2021-11-16 DIAGNOSIS — R300 Dysuria: Secondary | ICD-10-CM

## 2021-11-16 DIAGNOSIS — E876 Hypokalemia: Secondary | ICD-10-CM | POA: Diagnosis not present

## 2021-11-16 DIAGNOSIS — D6832 Hemorrhagic disorder due to extrinsic circulating anticoagulants: Secondary | ICD-10-CM | POA: Diagnosis not present

## 2021-11-16 DIAGNOSIS — T45515A Adverse effect of anticoagulants, initial encounter: Secondary | ICD-10-CM

## 2021-11-16 DIAGNOSIS — I5033 Acute on chronic diastolic (congestive) heart failure: Secondary | ICD-10-CM | POA: Diagnosis not present

## 2021-11-16 DIAGNOSIS — J9601 Acute respiratory failure with hypoxia: Secondary | ICD-10-CM

## 2021-11-16 DIAGNOSIS — I10 Essential (primary) hypertension: Secondary | ICD-10-CM | POA: Diagnosis not present

## 2021-11-16 DIAGNOSIS — J3489 Other specified disorders of nose and nasal sinuses: Secondary | ICD-10-CM | POA: Diagnosis not present

## 2021-11-16 DIAGNOSIS — J441 Chronic obstructive pulmonary disease with (acute) exacerbation: Secondary | ICD-10-CM | POA: Diagnosis not present

## 2021-11-16 LAB — BASIC METABOLIC PANEL
BUN: 10 mg/dL (ref 6–23)
CO2: 34 mEq/L — ABNORMAL HIGH (ref 19–32)
Calcium: 8.9 mg/dL (ref 8.4–10.5)
Chloride: 103 mEq/L (ref 96–112)
Creatinine, Ser: 0.9 mg/dL (ref 0.40–1.20)
GFR: 58.77 mL/min — ABNORMAL LOW (ref >60.00)
Glucose, Bld: 94 mg/dL (ref 70–99)
Potassium: 3.9 mEq/L (ref 3.5–5.1)
Sodium: 142 mEq/L (ref 135–145)

## 2021-11-16 LAB — PROTIME-INR
INR: 5.9 ratio (ref 0.8–1.0)
Prothrombin Time: 57.9 s (ref 9.6–13.1)

## 2021-11-16 LAB — POCT INR: INR: 6 — AB (ref 2.0–3.0)

## 2021-11-16 MED ORDER — MUPIROCIN 2 % EX OINT: 1.0000 "application " | TOPICAL_OINTMENT | Freq: Two times a day (BID) | CUTANEOUS | 0 refills | Status: DC

## 2021-11-16 NOTE — Progress Notes (Signed)
Hold dose today and hold dose tomorrow and recheck 2/22.

## 2021-11-16 NOTE — Patient Instructions (Signed)
Use the Bactroban intranasally twice daily  Set up 2 week follow up.

## 2021-11-16 NOTE — Telephone Encounter (Signed)
Patient notified of update  and verbalized understanding. 

## 2021-11-16 NOTE — Patient Instructions (Addendum)
Pre visit review using our clinic review tool, if applicable. No additional management support is needed unless otherwise documented below in the visit note.  Hold dose today and hold dose tomorrow and recheck 2/22.

## 2021-11-16 NOTE — Progress Notes (Signed)
Established Patient Office Visit  Subjective:  Patient ID: Catherine [name], female    DOB: November 01, 1934  Age: 87 y.o. MRN: [id number]  CC:  Chief Complaint  Patient presents with   Hospitalization Follow-up    HPI RECIE CIRRINCIONE presents for hospital follow-up.  She had planned follow-up last Tuesday but we had to cancel that appointment because of my personal illness.  She was admitted February 2 through 7 with acute respiratory failure with hypoxia.  We reviewed hospital discharge summary with her and her husband.  She has chronic problems including history of COPD, diastolic heart failure, history of prosthetic mitral valve replacement 2018, history of CAD.    Her hypoxia was felt to be related to COPD exacerbation with possible mild component of diastolic heart failure.  She was treated with steroids and nebulizers.  Apparently diuresed about 2 L.  Her weight was down from prehospital weight.  She is currently back on her usual oral dose of torsemide.  She did have some hypokalemia during her hospitalization and last potassium was 3.4.  She is on oral potassium replacement.  She was weaned off oxygen.  Echocardiogram showed preserved EF with diastolic dysfunction.  Her prosthetic mitral valve appeared normal.  She is on chronic Coumadin and plans to see our chronic Coumadin clinic nurse later today.  She has history of CAD.  Recent troponin minimally elevated in the setting of CHF and COPD exacerbation.  EKG showed no acute findings.  She had some nausea and vomiting on admission.  This apparently did improve.  Her lipase and LFTs were normal.  She had some dysuria.  History of very frequent UTIs in the past.  Had been on low-dose Keflex prior to admission.  She was treated with ceftriaxone and then transition to oral Keflex at discharge.  Her culture did grow out E. coli.  She feels like she might be developing some recurrent urinary symptoms at this time.  She complained of some bilateral  chest wall pain.  CT angiogram chest did not show any acute abnormality.  She took some Vicodin which helped somewhat.  She had thoracic films which showed unchanged appearance of superior endplate compression fracture of T11 with no acute fracture.  She continues to feel very poorly.  She has poor appetite but is eating some.  She has been drinking frequent Gatorade.  Her weight is relatively stable.  She has noticed some strong odor to her urine and mild intermittent burning past couple of days.  She also relates occasional scab-like discharge from her right naris.  Past Medical History:  Diagnosis Date   Acute on chronic diastolic heart failure (HCC)    Aortic stenosis, mild 07/26/2017   Arthritis    Asthma    last attack 02/2015   Atrial fibrillation, chronic (HCC)    Cellulitis of left lower extremity 08/17/2016   Ulcer associated with severe venous insufficiency   Chronic anticoagulation    Chronic diastolic CHF (congestive heart failure) ([place])    Chronic kidney disease    "RIGHT MANY KIDNEY INFECTIONS AND STONES"   COPD (chronic obstructive pulmonary disease) (HCC)    Coronary artery disease    Dizziness    H/O: rheumatic fever    Heart murmur    Hypertension    PONV (postoperative nausea and vomiting)    ' SOMETIMES', BUT NOT ALWAYS"   S/P Maze operation for atrial fibrillation 10/14/2016   Complete bilateral atrial lesion set using cryothermy and bipolar radiofrequency ablation -  atrial appendage was not treated due to previous surgical procedure (open mitral commissurotomy)   S/P mitral valve replacement with bioprosthetic valve 10/14/2016   29 mm Medtronic Mosaic porcine bioprosthetic tissue valve   Tricuspid regurgitation 07/14/2021   UTI (urinary tract infection)    Valvular heart disease    Has mitral stenosis with prior mitral commissurotomy in 1970    Past Surgical History:  Procedure Laterality Date   ABDOMINAL HYSTERECTOMY  1983   endometriosis   APPENDECTOMY      BACK SURGERY     neurosurgery x2   CARDIAC CATHETERIZATION     CARDIAC CATHETERIZATION N/A 08/03/2016   Procedure: Right/Left Heart Cath and Coronary Angiography;  Surgeon: Peter M Martinique, MD;  Location: [facility];  Service: Cardiovascular;  Laterality: N/A;   cataract surg     CHOLECYSTECTOMY N/A 05/30/2015   Procedure: LAPAROSCOPIC CHOLECYSTECTOMY WITH INTRAOPERATIVE CHOLANGIOGRAM;  Surgeon: Excell Seltzer, MD;  Location: [facility];  Service: General;  Laterality: N/A;   COLONOSCOPY     CORONARY ARTERY BYPASS GRAFT N/A 10/14/2016   Procedure: CORONARY ARTERY BYPASS GRAFTING (CABG);  Surgeon: Rexene Alberts, MD;  Location: [facility];  Service: Open Heart Surgery;  Laterality: N/A;   EYE SURGERY     MAZE N/A 10/14/2016   Procedure: MAZE;  Surgeon: Rexene Alberts, MD;  Location: [facility];  Service: Open Heart Surgery;  Laterality: N/A;   MITRAL VALVE REPLACEMENT N/A 10/14/2016   Procedure: REDO MITRAL VALVE REPLACEMENT (MVR);  Surgeon: Rexene Alberts, MD;  Location: [facility];  Service: Open Heart Surgery;  Laterality: N/A;   MITRAL VALVE SURGERY Left 1970   Open mitral commissurotomy via left thoracotomy approach   TEE WITHOUT CARDIOVERSION N/A 07/05/2016   Procedure: TRANSESOPHAGEAL ECHOCARDIOGRAM (TEE);  Surgeon: [name], MD;  Location: [facility];  Service: Cardiovascular;  Laterality: N/A;   TEE WITHOUT CARDIOVERSION N/A 10/14/2016   Procedure: TRANSESOPHAGEAL ECHOCARDIOGRAM (TEE);  Surgeon: Rexene Alberts, MD;  Location: [facility];  Service: Open Heart Surgery;  Laterality: N/A;    Family History  Problem Relation Age of Onset   Leukemia Father    Breast cancer Neg Hx     Social History   Socioeconomic History   Marital status: Married    Spouse name: Not on file   Number of children: Not on file   Years of education: Not on file   Highest education level: Not on file  Occupational History   Not on file  Tobacco Use   Smoking status: Former    Packs/day: 1.00     Years: 15.00    Pack years: 15.00    Types: Cigarettes    Quit date: 09/28/1975    Years since quitting: 46.1   Smokeless tobacco: Never  Vaping Use   Vaping Use: Never used  Substance and Sexual Activity   Alcohol use: No    Alcohol/week: 0.0 standard drinks   Drug use: No   Sexual activity: Yes  Other Topics Concern   Not on file  Social History Narrative   Not on file   Social Determinants of Health   Financial Resource Strain: Low Risk    Difficulty of Paying Living Expenses: Not hard at all  Food Insecurity: No Food Insecurity   Worried About Charity fundraiser in the Last Year: Never true   [place] in the Last Year: Never true  Transportation Needs: No Transportation Needs   Lack of Transportation (Medical): No  Lack of Transportation (Non-Medical): No  Physical Activity: Inactive   Days of Exercise per Week: 0 days   Minutes of Exercise per Session: 0 min  Stress: No Stress Concern Present   Feeling of Stress : Not at all  Social Connections: Moderately Integrated   Frequency of Communication with Friends and Family: Twice a week   Frequency of Social Gatherings with Friends and Family: Twice a week   Attends Religious Services: More than 4 times per year   Active Member of [employer] or Organizations: No   Attends Music therapist: Never   Marital Status: Married  Human resources officer Violence: Not At Risk   Fear of Current or Ex-Partner: No   Emotionally Abused: No   Physically Abused: No   Sexually Abused: No    Outpatient Medications Prior to Visit  Medication Sig Dispense Refill   acetaminophen (TYLENOL) 500 MG tablet Take 1,000 mg by mouth every 6 (six) hours as needed for mild pain.     albuterol (VENTOLIN HFA) 108 (90 Base) MCG/ACT inhaler Inhale 2 puffs into the lungs every 4 (four) hours as needed for wheezing or shortness of breath. 1 each 2   cholecalciferol (VITAMIN D3) 25 MCG (1000 UNIT) tablet Take 1 tablet (1,000 Units total) by  mouth daily.     diazepam (VALIUM) 5 MG tablet TAKE ONE TABLET AT BEDTIME 30 tablet 5   doxazosin (CARDURA) 8 MG tablet TAKE 1 TABLET DAILY (Patient taking differently: Take 8 mg by mouth daily.) 90 tablet 3   HYDROcodone-acetaminophen (NORCO/VICODIN) 5-325 MG tablet Take 1 tablet by mouth 3 (three) times daily as needed for moderate pain. 10 tablet 0   metoprolol succinate (TOPROL-XL) 25 MG 24 hr tablet TAKE 1 TABLET DAILY (Patient taking differently: Take 25 mg by mouth daily.) 90 tablet 1   Multiple Vitamins-Minerals (HAIR/SKIN/NAILS/BIOTIN PO) Take 1 tablet by mouth daily.     Multiple Vitamins-Minerals (MULTIVITAMIN ADULT EXTRA C) CHEW Chew 2 tablets by mouth daily.     ondansetron (ZOFRAN) 4 MG tablet Take 1 tablet (4 mg total) by mouth every 8 (eight) hours as needed for nausea or vomiting. 15 tablet 0   potassium chloride (KLOR-CON) 10 MEQ tablet TAKE UP TO 8 TABLETS DAILY AS DIRECTED (Patient taking differently: Take 40 mEq by mouth 2 (two) times daily.) 720 tablet 3   rosuvastatin (CRESTOR) 5 MG tablet Take one tablet every Monday, wednesday Friday and Sunday (Patient taking differently: Take 5 mg by mouth See admin instructions. 42m Monday's, Wednesday's,  Friday's, and Sunday's) 30 tablet 11   senna (SENOKOT) 8.6 MG TABS tablet Take 1 tablet (8.6 mg total) by mouth daily as needed for mild constipation. 10 tablet 0   SYMBICORT 80-4.5 MCG/ACT inhaler INHALE 2 PUFFS TWICE DAILY (Patient taking differently: 2 puffs in the morning and at bedtime.) 10.2 g 6   torsemide (DEMADEX) 20 MG tablet Take 2 tablets (474mtotal) twice daily. You may take an extra tablet for a total dose of 3 tablets (6010motal) twice daily as needed for weight gain or swelling. (Patient taking differently: Take 40 mg by mouth 2 (two) times daily. You may take an extra tablet for a total dose of 3 tablets (45m49mtal) twice daily as needed for weight gain or swelling.) 390 tablet 1   triamcinolone cream (KENALOG) 0.1 %  Apply 1 application topically 2 (two) times daily. (Patient taking differently: Apply 1 application topically 2 (two) times daily as needed (rash).) 15 g 0  warfarin (COUMADIN) 2 MG tablet TAKE ONE TABLET DAILY BY MOUTH EXCEPT TAKE 1/2 TABLET ON WEDNESDAYS AND SATURDAYS OR AS DIRECTED BY ANTICOAGULATION CLINIC (Patient taking differently: Take 1-2 mg by mouth See admin instructions. 73m on Wednesday's and Saturday's, 252mon all other days of the week in the evening) 100 tablet 1   No facility-administered medications prior to visit.    Allergies  Allergen Reactions   Aldactone [Spironolactone] Other (See Comments)    dyspnea   Amoxicillin Palpitations    Tachycardia Has patient had a PCN reaction causing immediate rash, facial/tongue/throat swelling, SOB or lightheadedness with hypotension: no Has patient had a PCN reaction causing severe rash involving mucus membranes or skin necrosis: {no Has patient had a PCN reaction that required hospitalization {no Has patient had a PCN reaction occurring within the last 10 years: {yes If all of the above answers are "NO", then may proceed with Cephalosporin use.   Diltiazem Other (See Comments)    Causing headaches    Flagyl [Metronidazole Hcl] Other (See Comments)    Causing headaches     Flovent [Fluticasone Propionate] Other (See Comments)    Leg cramps   Gabapentin Swelling   Lyrica [Pregabalin] Swelling   Quinidine Diarrhea and Other (See Comments)    Fever diarrhea   Simvastatin Other (See Comments)    Leg pain, myalgia   Tramadol Nausea Only   Verapamil Other (See Comments)    myalgias   Amlodipine     Low extremity edema   Crestor [Rosuvastatin]     myalgia   Livalo [Pitavastatin]     myalgias   Zetia [Ezetimibe]     LEG CRAMPS    Ace Inhibitors Other (See Comments)    unknown   Benazepril Hcl Cough   Ciprocin-Fluocin-Procin [Fluocinolone Acetonide] Other (See Comments)    unknown   Ciprofloxacin Diarrhea   Codeine  Nausea Only   Nitrofurantoin Monohyd Macro Nausea Only    ROS Review of Systems  Constitutional:  Positive for fatigue. Negative for chills and fever.  Respiratory:  Positive for shortness of breath. Negative for cough.   Cardiovascular:  Negative for chest pain.  Gastrointestinal:  Positive for nausea. Negative for abdominal pain and vomiting.  Genitourinary:  Positive for dysuria. Negative for hematuria.  Neurological:  Negative for headaches.  Psychiatric/Behavioral:  Negative for confusion.      Objective:    Physical Exam Vitals reviewed.  HENT:     Nose:     Comments: Nasal exam left naris is clear.  Right naris reveals scab area along the medial septum anteriorly.  This has somewhat of a eroded sunken in appearance.  No active bleeding. Cardiovascular:     Rate and Rhythm: Normal rate.  Pulmonary:     Effort: Pulmonary effort is normal.     Breath sounds: Normal breath sounds.  Musculoskeletal:     Right lower leg: No edema.     Left lower leg: No edema.  Neurological:     Mental Status: She is alert.    BP (!) 132/58    Pulse 73    Temp 98.3 F (36.8 C) (Oral)    Ht 5' 3"  (1.6 m)    Wt 144 lb (65.3 kg)    SpO2 94%    BMI 25.51 kg/m  Wt Readings from Last 3 Encounters:  11/16/21 144 lb (65.3 kg)  11/10/21 143 lb 14.4 oz (65.3 kg)  11/03/21 145 lb (65.8 kg)     Health Maintenance Due  Topic Date Due   COVID-19 Vaccine (4 - Booster for Moderna series) 10/14/2020    There are no preventive care reminders to display for this patient.  Lab Results  Component Value Date   TSH 3.95 07/09/2020   Lab Results  Component Value Date   WBC 12.9 (H) 11/03/2021   HGB 12.4 11/03/2021   HCT 36.2 11/03/2021   MCV 88.9 11/03/2021   PLT 201 11/03/2021   Lab Results  Component Value Date   NA 141 11/03/2021   K 3.4 (L) 11/03/2021   CO2 26 11/03/2021   GLUCOSE 134 (H) 11/03/2021   BUN 21 11/03/2021   CREATININE 1.00 11/03/2021   BILITOT 0.9 10/29/2021    ALKPHOS 71 10/29/2021   AST 20 10/29/2021   ALT 17 10/29/2021   PROT 6.7 10/29/2021   ALBUMIN 3.7 10/29/2021   CALCIUM 8.8 (L) 11/03/2021   ANIONGAP 12 11/03/2021   EGFR 67 05/07/2021   GFR 67.66 10/01/2019   Lab Results  Component Value Date   CHOL 146 05/22/2021   Lab Results  Component Value Date   HDL 56 05/22/2021   Lab Results  Component Value Date   LDLCALC 70 05/22/2021   Lab Results  Component Value Date   TRIG 109 05/22/2021   Lab Results  Component Value Date   CHOLHDL 2.6 05/22/2021   Lab Results  Component Value Date   HGBA1C 5.3 10/12/2016      Assessment & Plan:   #1 recent admission for acute respiratory failure with hypoxia with suspicion of combination of COPD exacerbation with some mild acute on chronic diastolic heart failure.  Patient diuresed couple liters and weight is down from preadmission weight.  No edema at this time.  She has some chronic dyspnea which is unchanged.  O2 sat today room air 94%.  Continue torsemide.  Continue monitoring weight  #2 hypokalemia.  She had hypokalemia during recent admission.  Discharge potassium 3.4.  Recheck basic metabolic panel.  #3 history of frequent UTIs.  Recent E. coli UTI.  Patient with 2-day history of some mild dysuria.  Recheck urinalysis with reflex to culture if indicated  #4 intermittent discharge right naris.  No active bleeding.  She has scabbed area along the medial septum and somewhat eroded appearance.  Low threshold for ENT referral.  We discussed trial of Bactroban ointment twice daily and then follow-up in 2 weeks.  If not improved at that point ENT referral  #5 overall fatigue and poor appetite.  She has felt this way for quite some time.  Recent albumin 3.7.  Etiology unclear.   Meds ordered this encounter  Medications   mupirocin ointment (BACTROBAN) 2 %    Sig: Apply 1 application topically 2 (two) times daily.    Dispense:  22 g    Refill:  0    Follow-up: No follow-ups on  file.    [name], MD

## 2021-11-16 NOTE — Telephone Encounter (Signed)
Lab draw INR is 5.9 for today.  Contacted pt and advised to continue directions to hold coumadin until recheck on 2/22.  Advised pt that she would need to do a 6 minute walk to qualify for O2 and that Dr. Elease Hashimoto is not sure if it is necessary with the O2 sat reading you had today, at 94%. Advised if she thinks she needs oxygen to contact the office. Pt reports she does not think she needs at this time. Advised if anything changes or she has any signs of bleeding to contact the office or go to the ER. Pt verbalized understanding and was appreciative.

## 2021-11-16 NOTE — Telephone Encounter (Signed)
Pt is calling back about blood work results from today

## 2021-11-18 ENCOUNTER — Ambulatory Visit (INDEPENDENT_AMBULATORY_CARE_PROVIDER_SITE_OTHER): Payer: Medicare Other

## 2021-11-18 DIAGNOSIS — D6832 Hemorrhagic disorder due to extrinsic circulating anticoagulants: Secondary | ICD-10-CM | POA: Diagnosis not present

## 2021-11-18 DIAGNOSIS — T45515A Adverse effect of anticoagulants, initial encounter: Secondary | ICD-10-CM | POA: Diagnosis not present

## 2021-11-18 LAB — URINALYSIS W MICROSCOPIC + REFLEX CULTURE
Bilirubin Urine: NEGATIVE
Glucose, UA: NEGATIVE
Hgb urine dipstick: NEGATIVE
Ketones, ur: NEGATIVE
Nitrites, Initial: NEGATIVE
Protein, ur: NEGATIVE
RBC / HPF: NONE SEEN /HPF (ref 0–2)
Specific Gravity, Urine: 1.008 (ref 1.001–1.035)
Squamous Epithelial / HPF: NONE SEEN /HPF (ref <=5)
pH: 7 (ref 5.0–8.0)

## 2021-11-18 LAB — URINE CULTURE
MICRO NUMBER:: 13035105
SPECIMEN QUALITY:: ADEQUATE

## 2021-11-18 LAB — POCT INR: INR: 2.2 (ref 2.0–3.0)

## 2021-11-18 LAB — CULTURE INDICATED

## 2021-11-18 NOTE — Patient Instructions (Addendum)
Pre visit review using our clinic review tool, if applicable. No additional management support is needed unless otherwise documented below in the visit note.  Continue 1 tablet daily except take 1/2 tablet on Wednesdays and Saturdays. Recheck on 3/6.

## 2021-11-18 NOTE — Progress Notes (Signed)
Cardiology Office Note  Date:  11/19/2021   ID:  Aariyah, Sampey 10/03/1934, MRN [id number]  PCP:  [name], MD  Cardiologist:   [name], MD  Cardiothoracic surgeon: Dr. Roxy Manns  No chief complaint on file.  History of Present Illness: Catherine [name] is a 87 y.o. 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 Catherine [name] was previously a patient of [name]. She underwent mitral commissurotomy in the 1970s. On her echocardiogram 02/2014 she had an ejection fraction of 60-65% with moderate to severe mitral regurgitation. There was also mild aortic regurgitation. Her follow-up echo 02/2015 showed an EF of 60-65% with moderate to severe mitral regurgitation, moderate tricuspid regurgitation and elevated pulmonary artery pressures.  Repeat echo 9/11/7 revealed moderate pulmonary stenosis with moderate to severe mitral regurgitation.  She did not have any pulmonary hypertension and her LV was not dilated.  She was referred for left and right heart catheterization. She was found to have severe mitral stenosis with a 13 mmHg mean gradient, MVA 0.99 cm^2. She also had 50% LCx and 80% RCA lesions.  She had a 29 mm bioprosthetic mitral valve, single vessel CABG and MAZE with Dr. Roxy Manns on 10/14/16.  She was referred for a baseline echo 02/15/17 that revealed LVEF 60-65% with mild aortic stenosis (mean gradient 12 mmHg).  Her bioprosthetic mitral valve was functioning well.    After her surgery Catherine [name] continued to report fatigue.  It was unclear whether she was in atrial fibrillation or sinus rhythm with a long PR interval.  She was referred to the atrial fibrillation clinic where this remained unclear.  However, it was felt that if she were in atrial fibrillation it would not be possible to maintain sinus rhythm.  She  developed lower extremity edema. She stopped amlodipine and this improved. This was switched to irbesartan. She then developed diaphoresis and insomnia that she attributed to this medication. She was switched from lasix to torsemide which helped her abdominal bloating but she still had LE edema. She has followed up with Catherine Montana NP and noted increased LE edema and hip pain. She also reported dizziness and had a repeat echo 06/2021 with LV 60-65%. Her bioprosthetic mitral valve was functioning properly. She had moderate to severe tricuspid regurgitation and PASP was 81mmHg.   At her last appointment, she reported some shortness of breath that improved with torsemide. Her at home blood pressure was ranging 110s-150s/50s-60s, averaging in the 120s-130s. On 10/25/2021 she presented to the ED with complaints of fatigue, cough, nausea, fever, congestion, and headache. Her symptoms were thought to have a viral etiology. She was later admitted to the hospital 10/29/2021 and was found to be hypoxic to 84% with activity, BNP 234, and a chest CT revealed right middle lobe atelectasis and small pleural effusions. 2D echo noted preserved EF. She had moderate TR and her bioprosthetic mitral valve was stable. She also reported right lateral chest wall pain with upper back pain. It was suspected she had a COPD exacerbation with mild component of diastolic CHF.   She is accompanied by her husband. Today, she states she is not feeling well. She is having difficulty with coughing up sputum. Sometimes her cough is productive, but most of the time she feels like the sputum is stuck in her chest. She feels like she still has an infection  in her chest, and she endorses severe dyspnea and night sweats. Yesterday she noticed that her left foot was suddenly swelling. Having L>R edema is typical for her, which she attributes to a prior injury. She also notes her LLE is very sore to palpation. She presents a weight log today, which  shows some weight loss. Her husband notes she has not had as much of an appetite, but this is gradually improving. She quit smoking in 1977 after 15 years of smoking. Her husband reports that she also worked as a Theme park manager for years and was exposed to sprays, bleach, and other chemicals. She continues to use Symbicort and ProAir inhalers, which do help her. Of note, she had a previous COVID infection in 2020. Since then she has never felt that she returned to baseline. She denies any palpitations, lightheadedness, headaches, syncope, orthopnea, or PND.  Past Medical History:  Diagnosis Date   Acute on chronic diastolic heart failure (HCC)    Aortic stenosis, mild 07/26/2017   Arthritis    Asthma    last attack 02/2015   Atrial fibrillation, chronic (HCC)    Cellulitis of left lower extremity 08/17/2016   Ulcer associated with severe venous insufficiency   Chronic anticoagulation    Chronic diastolic CHF (congestive heart failure) ([place])    Chronic kidney disease    "RIGHT MANY KIDNEY INFECTIONS AND STONES"   COPD (chronic obstructive pulmonary disease) (HCC)    Coronary artery disease    Dizziness    H/O: rheumatic fever    Heart murmur    Hypertension    PONV (postoperative nausea and vomiting)    ' SOMETIMES', BUT NOT ALWAYS"   S/P Maze operation for atrial fibrillation 10/14/2016   Complete bilateral atrial lesion set using cryothermy and bipolar radiofrequency ablation - atrial appendage was not treated due to previous surgical procedure (open mitral commissurotomy)   S/P mitral valve replacement with bioprosthetic valve 10/14/2016   29 mm Medtronic Mosaic porcine bioprosthetic tissue valve   Tricuspid regurgitation 07/14/2021   UTI (urinary tract infection)    Valvular heart disease    Has mitral stenosis with prior mitral commissurotomy in 1970    Past Surgical History:  Procedure Laterality Date   ABDOMINAL HYSTERECTOMY  1983   endometriosis   APPENDECTOMY     BACK SURGERY      neurosurgery x2   CARDIAC CATHETERIZATION     CARDIAC CATHETERIZATION N/A 08/03/2016   Procedure: Right/Left Heart Cath and Coronary Angiography;  Surgeon: Peter M Martinique, MD;  Location: [facility];  Service: Cardiovascular;  Laterality: N/A;   cataract surg     CHOLECYSTECTOMY N/A 05/30/2015   Procedure: LAPAROSCOPIC CHOLECYSTECTOMY WITH INTRAOPERATIVE CHOLANGIOGRAM;  Surgeon: Excell Seltzer, MD;  Location: [facility];  Service: General;  Laterality: N/A;   COLONOSCOPY     CORONARY ARTERY BYPASS GRAFT N/A 10/14/2016   Procedure: CORONARY ARTERY BYPASS GRAFTING (CABG);  Surgeon: Rexene Alberts, MD;  Location: [facility];  Service: Open Heart Surgery;  Laterality: N/A;   EYE SURGERY     MAZE N/A 10/14/2016   Procedure: MAZE;  Surgeon: Rexene Alberts, MD;  Location: [facility];  Service: Open Heart Surgery;  Laterality: N/A;   MITRAL VALVE REPLACEMENT N/A 10/14/2016   Procedure: REDO MITRAL VALVE REPLACEMENT (MVR);  Surgeon: Rexene Alberts, MD;  Location: [facility];  Service: Open Heart Surgery;  Laterality: N/A;   MITRAL VALVE SURGERY Left 1970   Open mitral commissurotomy via left thoracotomy approach  TEE WITHOUT CARDIOVERSION N/A 07/05/2016   Procedure: TRANSESOPHAGEAL ECHOCARDIOGRAM (TEE);  Surgeon: [name], MD;  Location: [place] [facility];  Service: Cardiovascular;  Laterality: N/A;   TEE WITHOUT CARDIOVERSION N/A 10/14/2016   Procedure: TRANSESOPHAGEAL ECHOCARDIOGRAM (TEE);  Surgeon: Rexene Alberts, MD;  Location: [facility];  Service: Open Heart Surgery;  Laterality: N/A;    Current Outpatient Medications  Medication Sig Dispense Refill   acetaminophen (TYLENOL) 500 MG tablet Take 1,000 mg by mouth every 6 (six) hours as needed for mild pain.     albuterol (VENTOLIN HFA) 108 (90 Base) MCG/ACT inhaler Inhale 2 puffs into the lungs every 4 (four) hours as needed for wheezing or shortness of breath. 1 each 2   cholecalciferol (VITAMIN D3) 25 MCG (1000 UNIT) tablet Take 1 tablet (1,000 Units  total) by mouth daily.     diazepam (VALIUM) 5 MG tablet TAKE ONE TABLET AT BEDTIME 30 tablet 5   doxazosin (CARDURA) 8 MG tablet TAKE 1 TABLET DAILY (Patient taking differently: Take 8 mg by mouth daily.) 90 tablet 3   HYDROcodone-acetaminophen (NORCO/VICODIN) 5-325 MG tablet Take 1 tablet by mouth 3 (three) times daily as needed for moderate pain. 10 tablet 0   metoprolol succinate (TOPROL-XL) 25 MG 24 hr tablet TAKE 1 TABLET DAILY (Patient taking differently: Take 25 mg by mouth daily.) 90 tablet 1   Multiple Vitamins-Minerals (HAIR/SKIN/NAILS/BIOTIN PO) Take 1 tablet by mouth daily.     Multiple Vitamins-Minerals (MULTIVITAMIN ADULT EXTRA C) CHEW Chew 2 tablets by mouth daily.     mupirocin ointment (BACTROBAN) 2 % Apply 1 application topically 2 (two) times daily. 22 g 0   ondansetron (ZOFRAN) 4 MG tablet Take 1 tablet (4 mg total) by mouth every 8 (eight) hours as needed for nausea or vomiting. 15 tablet 0   potassium chloride (KLOR-CON) 10 MEQ tablet TAKE UP TO 8 TABLETS DAILY AS DIRECTED (Patient taking differently: Take 40 mEq by mouth 2 (two) times daily.) 720 tablet 3   rosuvastatin (CRESTOR) 5 MG tablet Take one tablet every Monday, wednesday Friday and Sunday (Patient taking differently: Take 5 mg by mouth See admin instructions. 5mg  Monday's, Wednesday's,  Friday's, and Sunday's) 30 tablet 11   senna (SENOKOT) 8.6 MG TABS tablet Take 1 tablet (8.6 mg total) by mouth daily as needed for mild constipation. 10 tablet 0   SYMBICORT 80-4.5 MCG/ACT inhaler INHALE 2 PUFFS TWICE DAILY (Patient taking differently: 2 puffs in the morning and at bedtime.) 10.2 g 6   torsemide (DEMADEX) 20 MG tablet Take 2 tablets (40mg  total) twice daily. You may take an extra tablet for a total dose of 3 tablets (60mg  total) twice daily as needed for weight gain or swelling. (Patient taking differently: Take 40 mg by mouth 2 (two) times daily. You may take an extra tablet for a total dose of 3 tablets (60mg  total)  twice daily as needed for weight gain or swelling.) 390 tablet 1   triamcinolone cream (KENALOG) 0.1 % Apply 1 application topically 2 (two) times daily. (Patient taking differently: Apply 1 application topically 2 (two) times daily as needed (rash).) 15 g 0   warfarin (COUMADIN) 2 MG tablet TAKE ONE TABLET DAILY BY MOUTH EXCEPT TAKE 1/2 TABLET ON WEDNESDAYS AND SATURDAYS OR AS DIRECTED BY ANTICOAGULATION CLINIC (Patient taking differently: Take 1-2 mg by mouth See admin instructions. 1mg  on Wednesday's and Saturday's, 2mg  on all other days of the week in the evening) 100 tablet 1   No current facility-administered medications  for this visit.    Allergies:   Aldactone [spironolactone], Amoxicillin, Diltiazem, Flagyl [metronidazole hcl], Flovent [fluticasone propionate], Gabapentin, Lyrica [pregabalin], Quinidine, Simvastatin, Tramadol, Verapamil, Amlodipine, Crestor [rosuvastatin], Livalo [pitavastatin], Zetia [ezetimibe], Ace inhibitors, Benazepril hcl, Ciprocin-fluocin-procin [fluocinolone acetonide], Ciprofloxacin, Codeine, and Nitrofurantoin monohyd macro    Social History:  The patient  reports that she quit smoking about 46 years ago. Her smoking use included cigarettes. She has a 15.00 pack-year smoking history. She has never used smokeless tobacco. She reports that she does not drink alcohol and does not use drugs.   Family History:  The patient's family history includes Leukemia in her father.   ROS:   Please see the history of present illness.   (+) Cough (+) Sputum production (+) Chest congestion (+) Shortness of breath (+) Night sweats  (+) Fatigue (+) LE Edema L>R (+) LLE pain All other systems are reviewed and negative.   PHYSICAL EXAM:  VS:  BP 122/70 (BP Location: Left Arm, Patient Position: Sitting, Cuff Size: Normal)    Pulse 71    Ht 5\' 3"  (1.6 m)    Wt 146 lb 11.2 oz (66.5 kg)    SpO2 96%    BMI 25.99 kg/m  , BMI Body mass index is 25.99 kg/m. GENERAL:  Well  appearing HEENT: Pupils equal round and reactive, fundi not visualized, oral mucosa unremarkable NECK:  No jugular venous distention, waveform within normal limits, carotid upstroke brisk and symmetric, no bruits LUNGS:  Clear to auscultation bilaterally HEART:  RRR.  PMI not displaced or sustained,S1 and S2 within normal limits, no S3, no S4, no clicks, no rubs,  III/VI systolic murmur at the LUSB ABD:   Abdomen flat. Positive bowel sounds normal in frequency in pitch, no bruits, no rebound, no guarding, no midline pulsatile mass, no hepatomegaly, no splenomegaly EXT:  2 plus pulses throughout,  1+LLE edema (L>R) to the ankles, no cyanosis no clubbing SKIN:  L LE anterior tibia chronic stasis dermatitis NEURO:  Cranial nerves II through XII grossly intact, motor grossly intact throughout Catherine [name]:  Cognitively intact, oriented to person place and time  EKG:   11/19/2021: EKG was not ordered. 07/14/2021:  No EKG was ordered. 07/21/20: Atrial fibrillation.  Rate 70 bpm.  Prior septal infarct. 01/10/20: Atrial fibrillation. Rate 56 bpm.  Prior septal infarct. 06/06/19: Atrial fibrillation.  Rate 61 bpm.  Prior septal infarct. 10/10/18: Sinus rhythm.  Rate 89 bpm.  Nonspecific ST changes.  01/05/18: Accelerated junctional rhythm.  Rate 68 bpm.  Incomplete RBBB.  04/26/17: Atrial fibrillation.  Rate 69 bpm. 03/08/17: Sinus bradycardia with sinus arrhythmia.   First degree heart block.  Junctional escape.  QTc 497 ms.  02/01/17: Sinus bradycardia with sinus arrhythmia and first degree block.  10/27/16: Sinus rhythm.  Rate 60 bpm.  First degree heart block. 05/21/16: atrial fibrillateion.  Prior septal infarct.  Rate 60 bpm.  Unchanged from 10/22/15.   Echo 10/30/2021:  1. Left ventricular ejection fraction, by estimation, is 60 to 65%. The  left ventricle has normal function. The left ventricle has no regional  wall motion abnormalities. Left ventricular diastolic parameters are  indeterminate.   2. Right  ventricular systolic function is mildly reduced. The right  ventricular size is mildly enlarged. There is moderately elevated  pulmonary artery systolic pressure.   3. Left atrial size was moderately dilated.   4. Right atrial size was moderately dilated.   5. Normal appearing bioprosthetic MVR no PVL low mean gradient at HR 58  bpm Stent struts protrude into LVOT but no obvious sub valvular gradients  . The mitral valve has been repaired/replaced. No evidence of mitral valve  regurgitation. No evidence of mitral stenosis.   6. Tricuspid valve regurgitation is moderate.   7. The aortic valve is tricuspid. There is moderate calcification of the  aortic valve. There is moderate thickening of the aortic valve. Aortic  valve regurgitation is not visualized. Mild aortic valve stenosis.   8. The inferior vena cava is normal in size with greater than 50%  respiratory variability, suggesting right atrial pressure of 3 mmHg.   CTA Chest 10/29/2021: COMPARISON:  Chest x-ray from earlier in the same day.   FINDINGS: Cardiovascular: Atherosclerotic calcifications of the thoracic aorta are noted. Changes consistent with prior coronary bypass grafting are noted. No aneurysmal dilatation is seen. Heart is at the upper limits of normal in size. The pulmonary artery shows a normal branching pattern. No intraluminal filling defect to suggest pulmonary embolism is seen. Mild coronary calcifications are noted.   Mediastinum/Nodes: Thoracic inlet is within normal limits. No sizable hilar or mediastinal adenopathy is noted. The esophagus as visualized is within normal limits.   Lungs/Pleura: Mild right middle lobe atelectatic changes are noted. Small pleural effusions are noted bilaterally. No sizable parenchymal nodules are seen. No pneumothorax is noted.   Upper Abdomen: Visualized upper abdomen is within normal limits.   Musculoskeletal: Postsurgical changes in the cervical spine are seen. No acute  bony abnormality is noted.   Review of the MIP images confirms the above findings.   IMPRESSION: No evidence of pulmonary emboli.   Right middle lobe atelectatic changes.   Small pleural effusions are seen.   Aortic Atherosclerosis (ICD10-I70.0).  Echo 07/07/2021 1. Left ventricular ejection fraction, by estimation, is 60 to 65%. The  left ventricle has normal function. The left ventricle has no regional  wall motion abnormalities. Left ventricular diastolic function could not  be evaluated.   2. Right ventricular systolic function is mildly reduced. The right  ventricular size is mildly enlarged. There is mildly elevated pulmonary  artery systolic pressure. The estimated right ventricular systolic  pressure is 32.9 mmHg.   3. Left atrial size was severely dilated.   4. Right atrial size was severely dilated.   5. 29 mm Medtronic Mosaic Bioprosthesis in mitral position (10/14/2016).  MG 5.5 @ 55 bpm. E max 2.1 m/s, PHT ~120 msec, EOA 2.1 cm2, DI 1.36. All  parameters are within normal limits for this prosthetic valve. The mitral  valve has been repaired/replaced.  No evidence of mitral valve regurgitation. Procedure Date: 10/14/2016.   6. There is at least moderate TR. there is systolic flow reversal in the  hepatic veins suggestive of significant TR. Interrogation poor on this  study. Tricuspid valve regurgitation is moderate to severe.   7. The aortic valve is tricuspid. There is mild calcification of the  aortic valve. There is mild thickening of the aortic valve. Aortic valve  regurgitation is not visualized. Mild to moderate aortic valve  sclerosis/calcification is present, without any  evidence of aortic stenosis.   8. The inferior vena cava is dilated in size with >50% respiratory  variability, suggesting right atrial pressure of 8 mmHg.   Echo 09/2019: 1. Left ventricular ejection fraction, by visual estimation, is 65 to  70%. The left ventricle has hyperdynamic  function. Left ventricular septal  wall thickness was mildly increased. There is mildly increased left  ventricular hypertrophy.   2. The left  ventricle has no regional wall motion abnormalities.   3. Global right ventricle has normal systolic function.The right  ventricular size is normal. No increase in right ventricular wall  thickness.   4. Left atrial size was moderately dilated.   5. Right atrial size was moderately dilated.   6. The mitral valve has been repaired/replaced. No evidence of mitral  valve regurgitation. No evidence of mitral stenosis.   7. Patient has a 29 mm biprosthetic valve functioning normally with no  PVL The mitral struts protrude into the LVOT quite a bit likely  contributing to some of the gradient across the LVOT/AV. Creates a small  "neointimal LVOT" area.   8. The tricuspid valve is normal in structure.   9. The aortic valve is tricuspid. Aortic valve regurgitation is not  visualized. Mild aortic valve stenosis.  10. Pulmonic regurgitation is mild.  11. The pulmonic valve was grossly normal. Pulmonic valve regurgitation is  mild.  12. Mildly elevated pulmonary artery systolic pressure.   Echo 07/06/18: Study Conclusions   - Left ventricle: The cavity size was normal. Systolic function was   vigorous. The estimated ejection fraction was in the range of 65%   to 70%. Wall motion was normal; there were no regional wall   motion abnormalities. Doppler parameters are consistent with high   ventricular filling pressure. - Aortic valve: Trileaflet; mildly thickened, mildly calcified   leaflets. There was mild stenosis. Peak velocity (S): 266 cm/s.   Mean gradient (S): 16 mm Hg. - Mitral valve: A bioprosthesis was present and functioning   normally. - Left atrium: The atrium was severely dilated. Anterior-posterior   dimension: 60 mm. - Tricuspid valve: There was mild regurgitation.  7 Day Event Monitor 06/28/17:   Quality: Fair.  Baseline  artifact. Predominant rhythm: Sinus rhythm with PACs and sinus arrhythmia.     Cannot  rule out episodes of atrial fibrillation.  Carotid Dopplers 10/12/16: 1-39% bilateral ICA stenosis  LHC/RHC 08/03/16: The left ventricular systolic function is normal. LV end diastolic pressure is normal. The left ventricular ejection fraction is 55-65% by visual estimate. There is no aortic valve stenosis. There is severe mitral valve stenosis. Prox Cx to Mid Cx lesion, 50 %stenosed. Mid RCA-2 lesion, 80 %stenosed. Mid RCA-1 lesion, 80 %stenosed. Hemodynamic findings consistent with mild pulmonary hypertension. LV end diastolic pressure is normal.   1. Coronary artery disease   - 50% mid LCx   - 80% sequential lesions in the mid RCA 2. Normal LV function 3. Severe mitral stenosis. MV gradient of 13 mm Hg. MVA 0.99 cm squared with index 0.57. 4. Moderate to severe mitral insufficiency 5. Mild pulmonary HTN. 6. Normal LV EDP 7. No significant AV gradient 8. Occluded right brachial artery.   Recent Labs: 11/27/2020: Magnesium 2.2 10/29/2021: ALT 17; B Natriuretic Peptide 234.5 11/03/2021: Hemoglobin 12.4; Platelets 201 11/16/2021: BUN 10; Creatinine, Ser 0.90; Potassium 3.9; Sodium 142    Lipid Panel    Component Value Date/Time   CHOL 146 05/22/2021 0843   TRIG 109 05/22/2021 0843   HDL 56 05/22/2021 0843   CHOLHDL 2.6 05/22/2021 0843   CHOLHDL 2.8 07/09/2020 1403   VLDL 42.8 (H) 09/24/2019 1054   LDLCALC 70 05/22/2021 0843   LDLCALC 77 07/09/2020 1403   LDLDIRECT 68.0 09/24/2019 1054      Wt Readings from Last 3 Encounters:  11/19/21 146 lb 11.2 oz (66.5 kg)  11/16/21 144 lb (65.3 kg)  11/10/21 143 lb 14.4 oz (65.3 kg)  ASSESSMENT AND PLAN:  S/P mitral valve replacement with bioprosthetic valve + CABG x1 + maze procedure Mitral valve stable on echo during her recent admission.  Continue torsemide.    Atrial fibrillation (HCC) Currently in sinus rhythm.  She is  anticoagulated with warfarin.  Continue metoprolol.  S/P CABG x 1 She has no angina.  Encouraged her to gradually increase her exercise by starting with walking around her home.  Continue rosuvastatin.  She isn't on aspirin as she is on warfarin.  COPD with acute exacerbation ([name]) Recent hospitalization for COPD exacerbation with mild volume overload.  She remains dyspneic with minimal exertion.  No wheezing on exam, no fever/chills but she did have night sweats last night.  I don't think her dyspnea is primarily heart failure.  Recommended f/u with PCP/pulmonary.  Will increase torsemide today/tomorrow as above.  Acute on chronic diastolic heart failure (HCC) Very mildly volume overloaded on exam with ankle edema that just started yesterday.  She has not noted improvement in her breathing since discharge from the hospital.  Echo showed mild improvement in her tricuspid regurgitation and mitral valve was stable.  Increase torsemide to 60mg  this afternoon and in the morning.  She will take an extra 20 mEq of potassium today and tomorrow.    Current medicines are reviewed at length with the patient today.  The patient does not have concerns regarding medicines.  The following changes have been made:  none  Labs/ tests ordered today include:   No orders of the defined types were placed in this encounter.   Disposition:   FU with Catherine Davidson C. Oval Linsey, MD, [facility] in 3 months.  I,Mathew Stumpf,acting as a Education administrator for [name], MD.,have documented all relevant documentation on the behalf of [name], MD,as directed by  [name], MD while in the presence of [name], MD.  I, Catherine Oval Linsey, MD have reviewed all documentation for this visit.  The documentation of the exam, diagnosis, procedures, and orders on 11/19/2021 are all accurate and complete.  Signed, Shiloh Swopes C. Oval Linsey, MD, [facility]  11/19/2021 10:50 AM    [place]

## 2021-11-18 NOTE — Progress Notes (Signed)
Continue 1 tablet daily except take 1/2 tablet on Wednesdays and Saturdays. Recheck on 3/6.

## 2021-11-19 ENCOUNTER — Encounter (HOSPITAL_BASED_OUTPATIENT_CLINIC_OR_DEPARTMENT_OTHER): Payer: Self-pay | Admitting: Cardiovascular Disease

## 2021-11-19 ENCOUNTER — Ambulatory Visit (HOSPITAL_BASED_OUTPATIENT_CLINIC_OR_DEPARTMENT_OTHER): Payer: Medicare Other | Admitting: Cardiovascular Disease

## 2021-11-19 ENCOUNTER — Other Ambulatory Visit: Payer: Self-pay

## 2021-11-19 DIAGNOSIS — I4891 Unspecified atrial fibrillation: Secondary | ICD-10-CM

## 2021-11-19 DIAGNOSIS — J441 Chronic obstructive pulmonary disease with (acute) exacerbation: Secondary | ICD-10-CM | POA: Diagnosis not present

## 2021-11-19 DIAGNOSIS — Z951 Presence of aortocoronary bypass graft: Secondary | ICD-10-CM

## 2021-11-19 DIAGNOSIS — Z953 Presence of xenogenic heart valve: Secondary | ICD-10-CM | POA: Diagnosis not present

## 2021-11-19 DIAGNOSIS — I5033 Acute on chronic diastolic (congestive) heart failure: Secondary | ICD-10-CM

## 2021-11-19 NOTE — Patient Instructions (Addendum)
Medication Instructions:  TAKE AN EXTRA TORSEMIDE 20 MG TODAY AND TOMORROW MORNING  TAKE AN EXTRA 20 MEQ TODAY AND TOMORROW   *If you need a refill on your cardiac medications before your next appointment, please call your pharmacy*  Lab Work: NONE   Testing/Procedures: NONE   Follow-Up: At [facility], you and your health needs are our priority.  As part of our continuing mission to provide you with exceptional heart care, we have created designated Provider Care Teams.  These Care Teams include your primary Cardiologist (physician) and Advanced Practice Providers (APPs -  Physician Assistants and Nurse Practitioners) who all work together to provide you with the care you need, when you need it.  We recommend signing up for the patient portal called "MyChart".  Sign up information is provided on this After Visit Summary.  MyChart is used to connect with patients for Virtual Visits (Telemedicine).  Patients are able to view lab/test results, encounter notes, upcoming appointments, etc.  Non-urgent messages can be sent to your provider as well.   To learn more about what you can do with MyChart, go to [email or web address].    Your next appointment:   3 month(s)  The format for your next appointment:   In Person  Provider:   [name], MD   Other Instructions CALL DR Dubois

## 2021-11-19 NOTE — Assessment & Plan Note (Signed)
Very mildly volume overloaded on exam with ankle edema that just started yesterday.  She has not noted improvement in her breathing since discharge from the hospital.  Echo showed mild improvement in her tricuspid regurgitation and mitral valve was stable.  Increase torsemide to 60mg  this afternoon and in the morning.  She will take an extra 20 mEq of potassium today and tomorrow.

## 2021-11-19 NOTE — Assessment & Plan Note (Signed)
Currently in sinus rhythm.  She is anticoagulated with warfarin.  Continue metoprolol.

## 2021-11-19 NOTE — Assessment & Plan Note (Signed)
Mitral valve stable on echo during her recent admission.  Continue torsemide.

## 2021-11-19 NOTE — Assessment & Plan Note (Signed)
Recent hospitalization for COPD exacerbation with mild volume overload.  She remains dyspneic with minimal exertion.  No wheezing on exam, no fever/chills but she did have night sweats last night.  I don't think her dyspnea is primarily heart failure.  Recommended f/u with PCP/pulmonary.  Will increase torsemide today/tomorrow as above.

## 2021-11-19 NOTE — Assessment & Plan Note (Signed)
She has no angina.  Encouraged her to gradually increase her exercise by starting with walking around her home.  Continue rosuvastatin.  She isn't on aspirin as she is on warfarin.

## 2021-11-24 DIAGNOSIS — I4891 Unspecified atrial fibrillation: Secondary | ICD-10-CM

## 2021-11-24 DIAGNOSIS — I10 Essential (primary) hypertension: Secondary | ICD-10-CM | POA: Diagnosis not present

## 2021-11-24 DIAGNOSIS — E785 Hyperlipidemia, unspecified: Secondary | ICD-10-CM

## 2021-11-24 DIAGNOSIS — J441 Chronic obstructive pulmonary disease with (acute) exacerbation: Secondary | ICD-10-CM

## 2021-11-24 DIAGNOSIS — I2510 Atherosclerotic heart disease of native coronary artery without angina pectoris: Secondary | ICD-10-CM

## 2021-11-24 DIAGNOSIS — I5033 Acute on chronic diastolic (congestive) heart failure: Secondary | ICD-10-CM | POA: Diagnosis not present

## 2021-11-25 ENCOUNTER — Other Ambulatory Visit: Payer: Self-pay | Admitting: Internal Medicine

## 2021-11-30 ENCOUNTER — Ambulatory Visit (INDEPENDENT_AMBULATORY_CARE_PROVIDER_SITE_OTHER): Payer: Medicare Other | Admitting: Family Medicine

## 2021-11-30 ENCOUNTER — Ambulatory Visit (INDEPENDENT_AMBULATORY_CARE_PROVIDER_SITE_OTHER): Payer: Medicare Other

## 2021-11-30 ENCOUNTER — Encounter: Payer: Self-pay | Admitting: Family Medicine

## 2021-11-30 VITALS — BP 128/60 | HR 65 | Temp 98.2°F | Ht 63.0 in | Wt 147.1 lb

## 2021-11-30 DIAGNOSIS — J340 Abscess, furuncle and carbuncle of nose: Secondary | ICD-10-CM

## 2021-11-30 DIAGNOSIS — D6832 Hemorrhagic disorder due to extrinsic circulating anticoagulants: Secondary | ICD-10-CM

## 2021-11-30 DIAGNOSIS — R300 Dysuria: Secondary | ICD-10-CM

## 2021-11-30 DIAGNOSIS — R5383 Other fatigue: Secondary | ICD-10-CM | POA: Diagnosis not present

## 2021-11-30 DIAGNOSIS — T45515A Adverse effect of anticoagulants, initial encounter: Secondary | ICD-10-CM

## 2021-11-30 LAB — POC URINALSYSI DIPSTICK (AUTOMATED)
Bilirubin, UA: NEGATIVE
Blood, UA: NEGATIVE
Glucose, UA: NEGATIVE
Ketones, UA: NEGATIVE
Leukocytes, UA: NEGATIVE
Nitrite, UA: NEGATIVE
Protein, UA: NEGATIVE
Spec Grav, UA: 1.015 (ref 1.010–1.025)
Urobilinogen, UA: 0.2 E.U./dL
pH, UA: 6.5 (ref 5.0–8.0)

## 2021-11-30 LAB — TSH: TSH: 3.39 u[IU]/mL (ref 0.35–5.50)

## 2021-11-30 LAB — VITAMIN B12: Vitamin B-12: 374 pg/mL (ref 211–911)

## 2021-11-30 LAB — POCT INR: INR: 2.3 (ref 2.0–3.0)

## 2021-11-30 MED ORDER — MUPIROCIN 2 % EX OINT: 1.0000 "application " | TOPICAL_OINTMENT | Freq: Two times a day (BID) | CUTANEOUS | 2 refills | Status: DC

## 2021-11-30 NOTE — Patient Instructions (Signed)
Set up appointment to see me on the 27th.    ?

## 2021-11-30 NOTE — Progress Notes (Signed)
Established Patient Office Visit  Subjective:  Patient ID: Catherine [name], female    DOB: May 23, 1935  Age: 87 y.o. MRN: [id number]  CC:  Chief Complaint  Patient presents with   Follow-up   Dysuria    X 1 day     HPI Catherine [name] presents for medical follow-up.  Refer to last note for details.  She had increased malaise along with multiple nonspecific symptoms including some nausea without vomiting and poor appetite.  She had some intermittent dysuria and urinalysis last visit did show some leukocytes but urine culture came back mixed flora.  She continues to have some intermittent suprapubic discomfort.  No fever.  She had hypokalemia with recent admission.  Follow-up potassium was normal.  Her fluid intake is somewhat better.  She had recent admission for acute respiratory failure with hypoxemia suspected related to COPD exacerbation and possibly component of diastolic dysfunction.  She feels stable at this time.  Home weights are stable.  No dyspnea with basic ADLs.  O2 sat today room air 95%.  She had nasal septal ulcerative type lesion right naris last visit.  We started Bactroban.  She has noticed last nosebleed and less nasal drip since starting the Bactroban.  Past Medical History:  Diagnosis Date   Acute on chronic diastolic heart failure (HCC)    Aortic stenosis, mild 07/26/2017   Arthritis    Asthma    last attack 02/2015   Atrial fibrillation, chronic (HCC)    Cellulitis of left lower extremity 08/17/2016   Ulcer associated with severe venous insufficiency   Chronic anticoagulation    Chronic diastolic CHF (congestive heart failure) ([place] [facility])    Chronic kidney disease    "RIGHT MANY KIDNEY INFECTIONS AND STONES"   COPD (chronic obstructive pulmonary disease) (HCC)    Coronary artery disease    Dizziness    H/O: rheumatic fever    Heart murmur    Hypertension    PONV (postoperative nausea and vomiting)    ' SOMETIMES', BUT NOT ALWAYS"   S/P Maze operation for  atrial fibrillation 10/14/2016   Complete bilateral atrial lesion set using cryothermy and bipolar radiofrequency ablation - atrial appendage was not treated due to previous surgical procedure (open mitral commissurotomy)   S/P mitral valve replacement with bioprosthetic valve 10/14/2016   29 mm Medtronic Mosaic porcine bioprosthetic tissue valve   Tricuspid regurgitation 07/14/2021   UTI (urinary tract infection)    Valvular heart disease    Has mitral stenosis with prior mitral commissurotomy in 1970    Past Surgical History:  Procedure Laterality Date   ABDOMINAL HYSTERECTOMY  1983   endometriosis   APPENDECTOMY     BACK SURGERY     neurosurgery x2   CARDIAC CATHETERIZATION     CARDIAC CATHETERIZATION N/A 08/03/2016   Procedure: Right/Left Heart Cath and Coronary Angiography;  Surgeon: Peter M Martinique, MD;  Location: [place] [facility];  Service: Cardiovascular;  Laterality: N/A;   cataract surg     CHOLECYSTECTOMY N/A 05/30/2015   Procedure: LAPAROSCOPIC CHOLECYSTECTOMY WITH INTRAOPERATIVE CHOLANGIOGRAM;  Surgeon: Excell Seltzer, MD;  Location: [place];  Service: General;  Laterality: N/A;   COLONOSCOPY     CORONARY ARTERY BYPASS GRAFT N/A 10/14/2016   Procedure: CORONARY ARTERY BYPASS GRAFTING (CABG);  Surgeon: Rexene Alberts, MD;  Location: [place];  Service: Open Heart Surgery;  Laterality: N/A;   EYE SURGERY     MAZE N/A 10/14/2016   Procedure: MAZE;  Surgeon: Valentina Gu  Roxy Manns, MD;  Location: [place];  Service: Open Heart Surgery;  Laterality: N/A;   MITRAL VALVE REPLACEMENT N/A 10/14/2016   Procedure: REDO MITRAL VALVE REPLACEMENT (MVR);  Surgeon: Rexene Alberts, MD;  Location: [place];  Service: Open Heart Surgery;  Laterality: N/A;   MITRAL VALVE SURGERY Left 1970   Open mitral commissurotomy via left thoracotomy approach   TEE WITHOUT CARDIOVERSION N/A 07/05/2016   Procedure: TRANSESOPHAGEAL ECHOCARDIOGRAM (TEE);  Surgeon: [name], MD;  Location: [place];  Service:  Cardiovascular;  Laterality: N/A;   TEE WITHOUT CARDIOVERSION N/A 10/14/2016   Procedure: TRANSESOPHAGEAL ECHOCARDIOGRAM (TEE);  Surgeon: Rexene Alberts, MD;  Location: [place];  Service: Open Heart Surgery;  Laterality: N/A;    Family History  Problem Relation Age of Onset   Leukemia Father    Breast cancer Neg Hx     Social History   Socioeconomic History   Marital status: Married    Spouse name: Not on file   Number of children: Not on file   Years of education: Not on file   Highest education level: Not on file  Occupational History   Not on file  Tobacco Use   Smoking status: Former    Packs/day: 1.00    Years: 15.00    Pack years: 15.00    Types: Cigarettes    Quit date: 09/28/1975    Years since quitting: 46.2   Smokeless tobacco: Never  Vaping Use   Vaping Use: Never used  Substance and Sexual Activity   Alcohol use: No    Alcohol/week: 0.0 standard drinks   Drug use: No   Sexual activity: Yes  Other Topics Concern   Not on file  Social History Narrative   Not on file   Social Determinants of Health   Financial Resource Strain: Low Risk    Difficulty of Paying Living Expenses: Not hard at all  Food Insecurity: No Food Insecurity   Worried About Charity fundraiser in the Last Year: Never true   [place] in the Last Year: Never true  Transportation Needs: No Transportation Needs   Lack of Transportation (Medical): No   Lack of Transportation (Non-Medical): No  Physical Activity: Inactive   Days of Exercise per Week: 0 days   Minutes of Exercise per Session: 0 min  Stress: No Stress Concern Present   Feeling of Stress : Not at all  Social Connections: Moderately Integrated   Frequency of Communication with Friends and Family: Twice a week   Frequency of Social Gatherings with Friends and Family: Twice a week   Attends Religious Services: More than 4 times per year   Active Member of [employer] or Organizations: No   Attends Arts administrator: Never   Marital Status: Married  Human resources officer Violence: Not At Risk   Fear of Current or Ex-Partner: No   Emotionally Abused: No   Physically Abused: No   Sexually Abused: No    Outpatient Medications Prior to Visit  Medication Sig Dispense Refill   acetaminophen (TYLENOL) 500 MG tablet Take 1,000 mg by mouth every 6 (six) hours as needed for mild pain.     albuterol (VENTOLIN HFA) 108 (90 Base) MCG/ACT inhaler INHALE 2 PUFFS INTO THE LUNGS EVERY 4 HOURS AS NEEDED FOR AHEEZING OR SHORTNESS OF BREATH 8.5 g 2   cholecalciferol (VITAMIN D3) 25 MCG (1000 UNIT) tablet Take 1 tablet (1,000 Units total) by mouth daily.     diazepam (VALIUM)  5 MG tablet TAKE ONE TABLET AT BEDTIME 30 tablet 5   doxazosin (CARDURA) 8 MG tablet TAKE 1 TABLET DAILY (Patient taking differently: Take 8 mg by mouth daily.) 90 tablet 3   HYDROcodone-acetaminophen (NORCO/VICODIN) 5-325 MG tablet Take 1 tablet by mouth 3 (three) times daily as needed for moderate pain. 10 tablet 0   metoprolol succinate (TOPROL-XL) 25 MG 24 hr tablet TAKE 1 TABLET DAILY (Patient taking differently: Take 25 mg by mouth daily.) 90 tablet 1   Multiple Vitamins-Minerals (HAIR/SKIN/NAILS/BIOTIN PO) Take 1 tablet by mouth daily.     Multiple Vitamins-Minerals (MULTIVITAMIN ADULT EXTRA C) CHEW Chew 2 tablets by mouth daily.     ondansetron (ZOFRAN) 4 MG tablet Take 1 tablet (4 mg total) by mouth every 8 (eight) hours as needed for nausea or vomiting. 15 tablet 0   potassium chloride (KLOR-CON) 10 MEQ tablet TAKE UP TO 8 TABLETS DAILY AS DIRECTED (Patient taking differently: Take 40 mEq by mouth 2 (two) times daily.) 720 tablet 3   rosuvastatin (CRESTOR) 5 MG tablet Take one tablet every Monday, wednesday Friday and Sunday (Patient taking differently: Take 5 mg by mouth See admin instructions. 42m Monday's, Wednesday's,  Friday's, and Sunday's) 30 tablet 11   senna (SENOKOT) 8.6 MG TABS tablet Take 1 tablet (8.6 mg total) by mouth daily  as needed for mild constipation. 10 tablet 0   SYMBICORT 80-4.5 MCG/ACT inhaler INHALE 2 PUFFS TWICE DAILY (Patient taking differently: 2 puffs in the morning and at bedtime.) 10.2 g 6   torsemide (DEMADEX) 20 MG tablet Take 2 tablets (463mtotal) twice daily. You may take an extra tablet for a total dose of 3 tablets (6086motal) twice daily as needed for weight gain or swelling. (Patient taking differently: Take 40 mg by mouth 2 (two) times daily. You may take an extra tablet for a total dose of 3 tablets (88m38mtal) twice daily as needed for weight gain or swelling.) 390 tablet 1   triamcinolone cream (KENALOG) 0.1 % Apply 1 application topically 2 (two) times daily. (Patient taking differently: Apply 1 application. topically 2 (two) times daily as needed (rash).) 15 g 0   warfarin (COUMADIN) 2 MG tablet TAKE ONE TABLET DAILY BY MOUTH EXCEPT TAKE 1/2 TABLET ON WEDNESDAYS AND SATURDAYS OR AS DIRECTED BY ANTICOAGULATION CLINIC (Patient taking differently: Take 1-2 mg by mouth See admin instructions. 1mg 67mWednesday's and Saturday's, 2mg o65mll other days of the week in the evening) 100 tablet 1   mupirocin ointment (BACTROBAN) 2 % Apply 1 application topically 2 (two) times daily. 22 g 0   No facility-administered medications prior to visit.    Allergies  Allergen Reactions   Aldactone [Spironolactone] Other (See Comments)    dyspnea   Amoxicillin Palpitations    Tachycardia Has patient had a PCN reaction causing immediate rash, facial/tongue/throat swelling, SOB or lightheadedness with hypotension: no Has patient had a PCN reaction causing severe rash involving mucus membranes or skin necrosis: {no Has patient had a PCN reaction that required hospitalization {no Has patient had a PCN reaction occurring within the last 10 years: {yes If all of the above answers are "NO", then may proceed with Cephalosporin use.   Diltiazem Other (See Comments)    Causing headaches    Flagyl [Metronidazole  Hcl] Other (See Comments)    Causing headaches     Flovent [Fluticasone Propionate] Other (See Comments)    Leg cramps   Gabapentin Swelling   Lyrica [Pregabalin] Swelling  Quinidine Diarrhea and Other (See Comments)    Fever diarrhea   Simvastatin Other (See Comments)    Leg pain, myalgia   Tramadol Nausea Only   Verapamil Other (See Comments)    myalgias   Amlodipine     Low extremity edema   Crestor [Rosuvastatin]     myalgia   Livalo [Pitavastatin]     myalgias   Zetia [Ezetimibe]     LEG CRAMPS    Ace Inhibitors Other (See Comments)    unknown   Benazepril Hcl Cough   Ciprocin-Fluocin-Procin [Fluocinolone Acetonide] Other (See Comments)    unknown   Ciprofloxacin Diarrhea   Codeine Nausea Only   Nitrofurantoin Monohyd Macro Nausea Only    ROS Review of Systems  Constitutional:  Positive for fatigue. Negative for chills and fever.  HENT:  Negative for sinus pressure and sinus pain.   Respiratory:  Negative for cough and shortness of breath.   Cardiovascular:  Negative for chest pain.  Genitourinary:  Positive for dysuria. Negative for flank pain and hematuria.     Objective:    Physical Exam Vitals reviewed.  Constitutional:      Appearance: Normal appearance.  HENT:     Nose:     Comments: Right naris reveals along the anterior medial septum proxy 1/2 cm area of eschar which appears to be filling in and healing.  Couple weeks ago this looks more ulcerated of and appears to be in a state of healing at this time.  No bleeding. Cardiovascular:     Rate and Rhythm: Normal rate.  Pulmonary:     Effort: Pulmonary effort is normal.     Breath sounds: Normal breath sounds.  Neurological:     Mental Status: She is alert.    BP 128/60 (BP Location: Left Arm, Patient Position: Sitting, Cuff Size: Normal)    Pulse 65    Temp 98.2 F (36.8 C) (Oral)    Ht 5' 3"  (1.6 m)    Wt 147 lb 1.6 oz (66.7 kg)    SpO2 95%    BMI 26.06 kg/m  Wt Readings from Last 3  Encounters:  11/30/21 147 lb 1.6 oz (66.7 kg)  11/19/21 146 lb 11.2 oz (66.5 kg)  11/16/21 144 lb (65.3 kg)     Health Maintenance Due  Topic Date Due   COVID-19 Vaccine (4 - Booster for Moderna series) 10/14/2020    There are no preventive care reminders to display for this patient.  Lab Results  Component Value Date   TSH 3.95 07/09/2020   Lab Results  Component Value Date   WBC 12.9 (H) 11/03/2021   HGB 12.4 11/03/2021   HCT 36.2 11/03/2021   MCV 88.9 11/03/2021   PLT 201 11/03/2021   Lab Results  Component Value Date   NA 142 11/16/2021   K 3.9 11/16/2021   CO2 34 (H) 11/16/2021   GLUCOSE 94 11/16/2021   BUN 10 11/16/2021   CREATININE 0.90 11/16/2021   BILITOT 0.9 10/29/2021   ALKPHOS 71 10/29/2021   AST 20 10/29/2021   ALT 17 10/29/2021   PROT 6.7 10/29/2021   ALBUMIN 3.7 10/29/2021   CALCIUM 8.9 11/16/2021   ANIONGAP 12 11/03/2021   EGFR 67 05/07/2021   GFR 58.77 (L) 11/16/2021   Lab Results  Component Value Date   CHOL 146 05/22/2021   Lab Results  Component Value Date   HDL 56 05/22/2021   Lab Results  Component Value Date   LDLCALC 70 05/22/2021  Lab Results  Component Value Date   TRIG 109 05/22/2021   Lab Results  Component Value Date   CHOLHDL 2.6 05/22/2021   Lab Results  Component Value Date   HGBA1C 5.3 10/12/2016      Assessment & Plan:   #1 ongoing fatigue.  Likely multifactorial.  No recent B12 or TSH and these will be checked.  Recommend she continue to try to build her stamina by slowly increasing activities  #2 recurrent dysuria.  History of frequent UTIs in the past.  Repeat urine dipstick today is completely clear.  Reassurance.  #3 ulcerative type lesion right naris anterior medial septum.  This appears to be healing after recent initiation of Bactroban.  Recheck this once more in 3 weeks.  #4 history of recent acute respiratory failure probably related to COPD exacerbation and congestive heart failure-diastolic.   Currently stable symptomatically.   Meds ordered this encounter  Medications   mupirocin ointment (BACTROBAN) 2 %    Sig: Apply 1 application. topically 2 (two) times daily.    Dispense:  22 g    Refill:  2    Follow-up: No follow-ups on file.    [name], MD

## 2021-11-30 NOTE — Progress Notes (Signed)
Continue 1 tablet daily except take 1/2 tablet on Wednesdays and Saturdays. ?Recheck in 3 weeks.  ?

## 2021-11-30 NOTE — Patient Instructions (Addendum)
Pre visit review using our clinic review tool, if applicable. No additional management support is needed unless otherwise documented below in the visit note. ? ?Continue 1 tablet daily except take 1/2 tablet on Wednesdays and Saturdays. ?Recheck in 3 weeks.  ?

## 2021-12-01 ENCOUNTER — Ambulatory Visit (INDEPENDENT_AMBULATORY_CARE_PROVIDER_SITE_OTHER): Payer: Medicare Other

## 2021-12-01 DIAGNOSIS — J441 Chronic obstructive pulmonary disease with (acute) exacerbation: Secondary | ICD-10-CM

## 2021-12-01 DIAGNOSIS — I10 Essential (primary) hypertension: Secondary | ICD-10-CM

## 2021-12-01 DIAGNOSIS — I5033 Acute on chronic diastolic (congestive) heart failure: Secondary | ICD-10-CM

## 2021-12-01 DIAGNOSIS — I2510 Atherosclerotic heart disease of native coronary artery without angina pectoris: Secondary | ICD-10-CM

## 2021-12-01 DIAGNOSIS — I4891 Unspecified atrial fibrillation: Secondary | ICD-10-CM

## 2021-12-01 NOTE — Patient Instructions (Signed)
Visit Information ? ?Thank you for taking time to visit with me today. Please don't hesitate to contact me if I can be of assistance to you before our next scheduled telephone appointment. ? ?Following are the goals we discussed today:  ?Take all medications as prescribed ?Attend all scheduled provider appointments ?Call pharmacy for medication refills 3-7 days in advance of running out of medications ?Call provider office for new concerns or questions  ?call office if I gain more than 2 pounds in one day or 5 pounds in one week ?keep legs up while sitting ?track weight in diary ?watch for swelling in feet, ankles and legs every day ?weigh myself daily ?follow rescue plan if symptoms flare-up ?eat more whole grains, fruits and vegetables, lean meats and healthy fats ?identify and remove indoor air pollutants ?limit outdoor activity during cold weather ?do breathing exercises every day ?follow rescue plan if symptoms flare-up ?get at least 7 to 8 hours of sleep at night ?use devices that will help like a cane, sock-puller or reacher ?do breathing exercises every day ?check pulse (heart) rate once a day ?make a plan to eat healthy ?keep all lab appointments ?check blood pressure 3 times per week ?take blood pressure log to all doctor appointments ?call doctor for signs and symptoms of high blood pressure ?take medications for blood pressure exactly as prescribed ?limit salt intake to less than '2300mg'$ /day ?call for medicine refill 2 or 3 days before it runs out ?take all medications exactly as prescribed ?call doctor with any symptoms you believe are related to your medicine ? ?Our next appointment is by telephone on 01/07/22 at 11:30 AM ? ?Please call the care guide team at [phone] if you need to cancel or reschedule your appointment.  ? ?If you are experiencing a Mental Health or [place] or need someone to talk to, please call the Suicide and Crisis Lifeline: 988 ?call the [place] National Suicide  Prevention Lifeline: 630-781-9755 or TTY: 208-637-6898 TTY (515)336-5669) to talk to a trained counselor ?call 1-800-273-TALK (toll free, 24 hour hotline) ?go to [facility] [address] [place] [phone]) ?call 911  ? ?The patient verbalized understanding of instructions, educational materials, and care plan provided today and agreed to receive a mailed copy of patient instructions, educational materials, and care plan.  ? ?Peter Garter RN, BSN,CCM, CDE ?Care Management Coordinator ?[facility] ?[phone]   ?

## 2021-12-01 NOTE — Chronic Care Management (AMB) (Signed)
Chronic Care Management   CCM RN Visit Note  12/01/2021 Name: Catherine [name] MRN: [id number] DOB: 1935-02-02  Subjective: Catherine [name] is a 87 y.o. year old female who is a primary care patient of Burchette, Alinda Sierras, MD. The care management team was consulted for assistance with disease management and care coordination needs.    Engaged with patient by telephone for follow up visit in response to provider referral for case management and/or care coordination services.   Consent to Services:  The patient was given information about Chronic Care Management services, agreed to services, and gave verbal consent prior to initiation of services.  Please see initial visit note for detailed documentation.   Patient agreed to services and verbal consent obtained.   Assessment: Review of patient past medical history, allergies, medications, health status, including review of consultants reports, laboratory and other test data, was performed as part of comprehensive evaluation and provision of chronic care management services.   SDOH (Social Determinants of Health) assessments and interventions performed:    CCM Care Plan  Allergies  Allergen Reactions   Aldactone [Spironolactone] Other (See Comments)    dyspnea   Amoxicillin Palpitations    Tachycardia Has patient had a PCN reaction causing immediate rash, facial/tongue/throat swelling, SOB or lightheadedness with hypotension: no Has patient had a PCN reaction causing severe rash involving mucus membranes or skin necrosis: {no Has patient had a PCN reaction that required hospitalization {no Has patient had a PCN reaction occurring within the last 10 years: {yes If all of the above answers are "NO", then may proceed with Cephalosporin use.   Diltiazem Other (See Comments)    Causing headaches    Flagyl [Metronidazole Hcl] Other (See Comments)    Causing headaches     Flovent [Fluticasone Propionate] Other (See Comments)    Leg cramps    Gabapentin Swelling   Lyrica [Pregabalin] Swelling   Quinidine Diarrhea and Other (See Comments)    Fever diarrhea   Simvastatin Other (See Comments)    Leg pain, myalgia   Tramadol Nausea Only   Verapamil Other (See Comments)    myalgias   Amlodipine     Low extremity edema   Crestor [Rosuvastatin]     myalgia   Livalo [Pitavastatin]     myalgias   Zetia [Ezetimibe]     LEG CRAMPS    Ace Inhibitors Other (See Comments)    unknown   Benazepril Hcl Cough   Ciprocin-Fluocin-Procin [Fluocinolone Acetonide] Other (See Comments)    unknown   Ciprofloxacin Diarrhea   Codeine Nausea Only   Nitrofurantoin Monohyd Macro Nausea Only    Outpatient Encounter Medications as of 12/01/2021  Medication Sig   acetaminophen (TYLENOL) 500 MG tablet Take 1,000 mg by mouth every 6 (six) hours as needed for mild pain.   albuterol (VENTOLIN HFA) 108 (90 Base) MCG/ACT inhaler INHALE 2 PUFFS INTO THE LUNGS EVERY 4 HOURS AS NEEDED FOR AHEEZING OR SHORTNESS OF BREATH   cholecalciferol (VITAMIN D3) 25 MCG (1000 UNIT) tablet Take 1 tablet (1,000 Units total) by mouth daily.   diazepam (VALIUM) 5 MG tablet TAKE ONE TABLET AT BEDTIME   doxazosin (CARDURA) 8 MG tablet TAKE 1 TABLET DAILY (Patient taking differently: Take 8 mg by mouth daily.)   HYDROcodone-acetaminophen (NORCO/VICODIN) 5-325 MG tablet Take 1 tablet by mouth 3 (three) times daily as needed for moderate pain.   metoprolol succinate (TOPROL-XL) 25 MG 24 hr tablet TAKE 1 TABLET DAILY (Patient taking differently: Take 25  mg by mouth daily.)   Multiple Vitamins-Minerals (HAIR/SKIN/NAILS/BIOTIN PO) Take 1 tablet by mouth daily.   Multiple Vitamins-Minerals (MULTIVITAMIN ADULT EXTRA C) CHEW Chew 2 tablets by mouth daily.   mupirocin ointment (BACTROBAN) 2 % Apply 1 application. topically 2 (two) times daily.   ondansetron (ZOFRAN) 4 MG tablet Take 1 tablet (4 mg total) by mouth every 8 (eight) hours as needed for nausea or vomiting.   potassium  chloride (KLOR-CON) 10 MEQ tablet TAKE UP TO 8 TABLETS DAILY AS DIRECTED (Patient taking differently: Take 40 mEq by mouth 2 (two) times daily.)   rosuvastatin (CRESTOR) 5 MG tablet Take one tablet every Monday, wednesday Friday and Sunday (Patient taking differently: Take 5 mg by mouth See admin instructions. 5mg  Monday's, Wednesday's,  Friday's, and Sunday's)   senna (SENOKOT) 8.6 MG TABS tablet Take 1 tablet (8.6 mg total) by mouth daily as needed for mild constipation.   SYMBICORT 80-4.5 MCG/ACT inhaler INHALE 2 PUFFS TWICE DAILY (Patient taking differently: 2 puffs in the morning and at bedtime.)   torsemide (DEMADEX) 20 MG tablet Take 2 tablets (40mg  total) twice daily. You may take an extra tablet for a total dose of 3 tablets (60mg  total) twice daily as needed for weight gain or swelling. (Patient taking differently: Take 40 mg by mouth 2 (two) times daily. You may take an extra tablet for a total dose of 3 tablets (60mg  total) twice daily as needed for weight gain or swelling.)   triamcinolone cream (KENALOG) 0.1 % Apply 1 application topically 2 (two) times daily. (Patient taking differently: Apply 1 application. topically 2 (two) times daily as needed (rash).)   warfarin (COUMADIN) 2 MG tablet TAKE ONE TABLET DAILY BY MOUTH EXCEPT TAKE 1/2 TABLET ON WEDNESDAYS AND SATURDAYS OR AS DIRECTED BY ANTICOAGULATION CLINIC (Patient taking differently: Take 1-2 mg by mouth See admin instructions. 1mg  on Wednesday's and Saturday's, 2mg  on all other days of the week in the evening)   No facility-administered encounter medications on file as of 12/01/2021.    Patient Active Problem List   Diagnosis Date Noted   Chest wall pain 11/02/2021   Nausea without vomiting 10/30/2021   Tricuspid regurgitation 07/14/2021   Protein-calorie malnutrition, severe 09/05/2019   Dehydration    Gastroenteritis    Enteritis 09/03/2019   COVID-19 08/27/2019   Dysuria 07/07/2019   Chronic anxiety 12/23/2017   Aortic  stenosis, mild 07/26/2017   Dyslipidemia 12/20/2016   S/P mitral valve replacement with bioprosthetic valve + CABG x1 + maze procedure 10/14/2016   S/P Maze operation for atrial fibrillation 10/14/2016   S/P CABG x 1 10/14/2016   Cellulitis of left lower extremity 08/17/2016   Acute on chronic diastolic heart failure (HCC)    Coronary artery disease    Chronic insomnia 07/12/2016   DOE (dyspnea on exertion) 03/26/2015   Warfarin-induced coagulopathy ([place]) 03/16/2015   Valvular heart disease 03/16/2015   COPD with acute exacerbation ([place]) 03/16/2015   Nephrolithiasis 03/16/2015   Acute respiratory failure with hypoxia ([place]) 03/11/2015   Essential hypertension    RHEUMATIC MITRAL STENOSIS 01/05/2008   Rheumatic heart disease 01/05/2008   Atrial fibrillation ([place]) 01/05/2008    Conditions to be addressed/monitored:Atrial Fibrillation, CHF, CAD, HTN, HLD, and COPD  Care Plan : RN Care Manager Plan of Care  Updates made by Dimitri Ped, RN since 12/01/2021 12:00 AM     Problem: Chronic Disease Management and Care Coordination Needs (CHF,COPD, Atrial Fib, HTN, HLD,CAD)   Priority: High  Long-Range Goal: Establish Plan of Care for Chronic Disease Management Needs (CHF,COPD, Atrial Fib, HTN, HLD,CAD)   Start Date: 11/06/2021  Expected End Date: 11/06/2022  Priority: High  Note:   Current Barriers:  Knowledge Deficits related to plan of care for management of Atrial Fibrillation, CHF, CAD, HTN, HLD, and COPD  Chronic Disease Management support and education needs related to Atrial Fibrillation, CHF, CAD, HTN, HLD, and COPD  States she saw Dr. Elease Hashimoto yesterday.  States she is still feeling fatigued and has bladder issues at times  States she gets short of breath with mild exertion but recovers when she rests.  States she is using her inhalers as ordered and has not needed to use her Albuterol inhaler.  States she is still using the incentive spirometer she got in the hospital.   States she is weighting daily and she weighted 144 this morning. Denies any chest pains or increase in swelling.  States she tries to not use salt and mostly cooks at home.  States she did go to [employer] twice a week before she got sick and she would like to get strong enough to start back.  RNCM Clinical Goal(s):  Patient will verbalize understanding of plan for management of Atrial Fibrillation, CHF, CAD, HTN, HLD, and COPD as evidenced by voiced adherence to plan of care verbalize basic understanding of  Atrial Fibrillation, CHF, CAD, HTN, HLD, and COPD disease process and self health management plan as evidenced by voiced understanding and teach back take all medications exactly as prescribed and will call provider for medication related questions as evidenced by readings within limits, voiced adherence to plan of care attend all scheduled medical appointments: Dr. Elease Hashimoto 12/21/21, Cardiology 03/04/22, CCM PharmD 12/15/21 as evidenced by medical records demonstrate Improved adherence to prescribed treatment plan for Atrial Fibrillation, CHF, CAD, HTN, HLD, and COPD as evidenced by readings within limits, voiced adherence to plan of care continue to work with RN Care Manager to address care management and care coordination needs related to  Atrial Fibrillation, CHF, CAD, HTN, HLD, and COPD as evidenced by adherence to CM Team Scheduled appointments work with pharmacist to address polypharmacy and adherence related toAtrial Fibrillation, CHF, CAD, HTN, HLD, and COPD as evidenced by review or EMR and patient or pharmacist report through collaboration with RN Care manager, provider, and care team.   Interventions: 1:1 collaboration with primary care provider regarding development and update of comprehensive plan of care as evidenced by provider attestation and co-signature Inter-disciplinary care team collaboration (see longitudinal plan of care) Evaluation of current treatment plan related to   self management and patient's adherence to plan as established by provider   AFIB Interventions: (Status:  Goal on track:  Yes.) Long Term Goal   Counseled on increased risk of stroke due to Afib and benefits of anticoagulation for stroke prevention Reviewed importance of adherence to anticoagulant exactly as prescribed Counseled on bleeding risk associated with Coumadin and importance of self-monitoring for signs/symptoms of bleeding Counseled on importance of regular laboratory monitoring as prescribed Counseled on seeking medical attention after a head injury or if there is blood in the urine/stool Reviewed foods to avoid with Coumadin   CAD Interventions: (Status:  Goal on track:  Yes.) Long Term Goal Assessed understanding of CAD diagnosis Medications reviewed including medications utilized in CAD treatment plan Provided education on importance of blood pressure control in management of CAD Provided education on Importance of limiting foods high in cholesterol Reviewed Importance of taking all  medications as prescribed Advised to report any changes in symptoms or exercise tolerance   Heart Failure Interventions:  (Status:  New goal.) Long Term Goal Basic overview and discussion of pathophysiology of Heart Failure reviewed Provided education on low sodium diet Reviewed Heart Failure Action Plan in depth and provided written copy Discussed importance of daily weight and advised patient to weigh and record daily Reviewed role of diuretics in prevention of fluid overload and management of heart failure; Discussed the importance of keeping all appointments with provider Reinforced s/sx of HF to call provider.  Reinforced to keep legs elevated when sitting  COPD Interventions:  (Status:  Goal on track:  Yes.) Long Term Goal Provided patient with basic written and verbal COPD education on self care/management/and exacerbation prevention Advised patient to track and manage COPD  triggers Provided instruction about proper use of medications used for management of COPD including inhalers Advised patient to self assesses COPD action plan zone and make appointment with provider if in the yellow zone for 48 hours without improvement Provided education about and advised patient to utilize infection prevention strategies to reduce risk of respiratory infection Discussed the importance of adequate rest and management of fatigue with COPD Reviewed to pace activity and to gradually increase as tolerated   Hyperlipidemia Interventions:  (Status:  Goal on track:  Yes.) Long Term Goal Medication review performed; medication list updated in electronic medical record.  Provider established cholesterol goals reviewed Counseled on importance of regular laboratory monitoring as prescribed Reviewed role and benefits of statin for ASCVD risk reduction Reviewed importance of limiting foods high in cholesterol  Hypertension Interventions:  (Status:  Goal on track:  Yes.) Long Term Goal Last practice recorded BP readings:  BP Readings from Last 3 Encounters:  11/30/21 128/60  11/19/21 122/70  11/16/21 (!) 132/58  Most recent eGFR/CrCl:  Lab Results  Component Value Date   EGFR 67 05/07/2021    No components found for: CRCL  Evaluation of current treatment plan related to hypertension self management and patient's adherence to plan as established by provider Provided education to patient re: stroke prevention, s/s of heart attack and stroke Reviewed medications with patient and discussed importance of compliance Discussed plans with patient for ongoing care management follow up and provided patient with direct contact information for care management team Advised patient, providing education and rationale, to monitor blood pressure daily and record, calling PCP for findings outside established parameters Provided education on prescribed diet low sodium heart healthy  Patient  Goals/Self-Care Activities: Take all medications as prescribed Attend all scheduled provider appointments Call pharmacy for medication refills 3-7 days in advance of running out of medications Call provider office for new concerns or questions  call office if I gain more than 2 pounds in one day or 5 pounds in one week keep legs up while sitting track weight in diary watch for swelling in feet, ankles and legs every day weigh myself daily follow rescue plan if symptoms flare-up eat more whole grains, fruits and vegetables, lean meats and healthy fats identify and remove indoor air pollutants limit outdoor activity during cold weather do breathing exercises every day follow rescue plan if symptoms flare-up get at least 7 to 8 hours of sleep at night use devices that will help like a cane, sock-puller or reacher do breathing exercises every day check pulse (heart) rate once a day make a plan to eat healthy keep all lab appointments check blood pressure 3 times per week take blood  pressure log to all doctor appointments call doctor for signs and symptoms of high blood pressure take medications for blood pressure exactly as prescribed limit salt intake to less than 2311m/day call for medicine refill 2 or 3 days before it runs out take all medications exactly as prescribed call doctor with any symptoms you believe are related to your medicine  Follow Up Plan:  Telephone follow up appointment with care management team member scheduled for:  01/07/22 The patient has been provided with contact information for the care management team and has been advised to call with any health related questions or concerns.       Plan:Telephone follow up appointment with care management team member scheduled for:  01/07/22 The patient has been provided with contact information for the care management team and has been advised to call with any health related questions or concerns.  MPeter GarterRN,  BJackquline Denmark CDE Care Management Coordinator [facility] ([phone]

## 2021-12-14 ENCOUNTER — Telehealth: Payer: Self-pay | Admitting: Pharmacist

## 2021-12-14 NOTE — Chronic Care Management (AMB) (Signed)
? ? ?  Chronic Care Management ?Pharmacy Assistant  ? ?Name: Catherine [name]  MRN: [id number] DOB: 12-Sep-1935 ? ?12/15/2021 APPOINTMENT REMINDER ? ?[name] was reminded to have all medications, supplements and any blood glucose and blood pressure readings available for review with [name], Pharm. D, at her telephone visit on 12/15/2021 at 1:00. ? ? ?Questions: ?Have you had any recent office visit or specialist visit outside of [place]?  Patient denies any visits outside of [facility] ? ?Are there any concerns you would like to discuss during your office visit?  Patient doesn't have any concerns at this time. ? ?Are you having any problems obtaining your medications? (Whether it pharmacy issues or cost)  Patient denies ? ?If patient has any PAP medications ask if they are having any problems getting their PAP medication or refill? Patient denies any medications filled by a pap ? ?Care Gaps: ?AWV - completed on 04/07/21 ?Last BP - 128/60 on 11/30/2021 ?Covid booster - overdue ?  ?Star Rating Drugs: ?Rosuvastatin '5mg'$  - last filled on 10/15/2021 58 DS at [facility]. ? ? ?Any gaps in medications fill history?  ? ?Gennie Alma CMA  ?Clinical Pharmacist Assistant ?[phone] ? ?

## 2021-12-15 ENCOUNTER — Ambulatory Visit: Payer: Medicare Other | Admitting: Pharmacist

## 2021-12-15 DIAGNOSIS — I10 Essential (primary) hypertension: Secondary | ICD-10-CM

## 2021-12-15 DIAGNOSIS — I4891 Unspecified atrial fibrillation: Secondary | ICD-10-CM

## 2021-12-15 NOTE — Progress Notes (Signed)
? ?Chronic Care Management ?Pharmacy Note ? ?12/15/2021 ?Name:  Catherine [name] MRN:  [id number] DOB:  [birth year] ? ?Summary: ?BP at goal < 140/90 per recent office readings ?Pt has had 2 UTIs since discharge ? ?Recommendations/Changes made from today's visit: ?-Recommended avoiding use of cranberry products for UTI as this can affect INR ?-Consider PRN antibiotics for UTI treatment ?-Recommended referral to urology for frequent UTIs ? ?Plan: ?BP assessment in 2 months ?Follow up in 4 months ? ? ?Subjective: ?Catherine [name] is an 87 y.o. year old female who is a primary patient of Burchette, Alinda Sierras, MD.  The CCM team was consulted for assistance with disease management and care coordination needs.   ? ?Engaged with patient face to face for follow up visit in response to provider referral for pharmacy case management and/or care coordination services.  ? ?Consent to Services:  ?The patient was given information about Chronic Care Management services, agreed to services, and gave verbal consent prior to initiation of services.  Please see initial visit note for detailed documentation.  ? ?Patient Care Team: ?[name], MD as PCP - General (Family Medicine) ?[name], MD as PCP - Cardiology (Cardiology) ?Viona Gilmore, [facility] as Pharmacist (Pharmacist) ?Dimitri Ped, RN as Case Manager ? ?Recent office visits: ?12/01/21 Harrell Gave, RN: Patient presented for RN CCM visit. ? ?11/30/21 [name] MD: Patient presented for dysuria and fatigue. No medication changes. ? ?11/30/21 [name], RN: Patient presented for anti-coag visit. INR 2.3, goal 2-3. Continued warfarin 1 mg (2 mg x 0.5) every Wed, Sat; 2 mg (2 mg x 1) all other days. Follow up in 3 weeks. ? ?11/16/21 [name] MD: Patient presented for hospital follow up. Prescribed bactroban. Follow up in 2 weeks. ? ?07/14/2021 [name] MD - Patient was seen for Lumbar pain and additional issues. Started Triamcinolone 0.1%  topical twice daily. No follow up noted. ? ?Recent consult visits: ?11/19/21 [name], MD (cardiology): Patient presented for mitral valve replacement follow up. Increased torsemide to 60 mg this afternoon and tomorrow morning. Take an extra potassium today and tomorrow. ? ?10/06/21 Claudie Revering, PTA (outpatient rehab): Patient presented for PT treatment. ? ?07/14/21 [name] MD (Cardiology) - seen for A-Fib. No medication changes. Follow up in 4 months.  ?  ?04/01/21 Christinia Gully MD (Pulmonary Disease) - seen for COPD GOLD II. Decreased Cephalexin from 51m 3 times a day to 2519mdaily. Follow up in 12 months or sooner if needed.  ? ?Hospital visits: ?2/2-11/03/21 [facility] acute respiratory failure with hypoxia. Prescribed prednisone. ? ?10/25/21 [facility].  ? ?Objective: ? ?Lab Results  ?Component Value Date  ? CREATININE 0.90 11/16/2021  ? BUN 10 11/16/2021  ? GFR 58.77 (L) 11/16/2021  ? GFRNONAA 56 (L) 11/03/2021  ? GFRAA 67 10/29/2020  ? NA 142 11/16/2021  ? K 3.9 11/16/2021  ? CALCIUM 8.9 11/16/2021  ? CO2 34 (H) 11/16/2021  ? GLUCOSE 94 11/16/2021  ? ? ?Lab Results  ?Component Value Date/Time  ? HGBA1C 5.3 10/12/2016 01:59 PM  ? GFR 58.77 (L) 11/16/2021 11:35 AM  ? GFR 67.66 10/01/2019 03:03 PM  ?  ?Last diabetic Eye exam: No results found for: HMDIABEYEEXA  ?Last diabetic Foot exam: No results found for: HMDIABFOOTEX  ? ?Lab Results  ?Component Value Date  ? CHOL 146 05/22/2021  ? HDL 56 05/22/2021  ? LDChatmoss0 05/22/2021  ?  LDLDIRECT 68.0 09/24/2019  ? TRIG 109 05/22/2021  ? CHOLHDL 2.6 05/22/2021  ? ? ?Hepatic Function Latest Ref Rng & Units 10/29/2021 10/25/2021 05/22/2021  ?Total Protein 6.5 - 8.1 g/dL 6.7 6.8 6.9  ?Albumin 3.5 - 5.0 g/dL 3.7 4.0 4.9(H)  ?AST 15 - 41 U/L 20 22 27   ?ALT 0 - 44 U/L 17 17 16   ?Alk Phosphatase 38 - 126 U/L 71 72 115  ?Total Bilirubin 0.3 - 1.2 mg/dL 0.9 1.3(H) 0.5   ?Bilirubin, Direct 0.00 - 0.40 mg/dL - - 0.16  ? ? ?Lab Results  ?Component Value Date/Time  ? TSH 3.39 11/30/2021 11:36 AM  ? TSH 3.95 07/09/2020 11:35 AM  ? FREET4 0.81 03/03/2015 10:51 AM  ? FREET4 0.81 03/04/2014 11:01 AM  ? ? ?CBC Latest Ref Rng & Units 11/03/2021 10/31/2021 10/29/2021  ?WBC 4.0 - 10.5 K/uL 12.9(H) 9.3 7.8  ?Hemoglobin 12.0 - 15.0 g/dL 12.4 11.5(L) 11.5(L)  ?Hematocrit 36.0 - 46.0 % 36.2 32.8(L) 34.9(L)  ?Platelets 150 - 400 K/uL 201 157 150  ? ? ?Lab Results  ?Component Value Date/Time  ? VD25OH 25.0 (L) 01/20/2021 12:09 PM  ? VD25OH 36 06/06/2012 11:09 AM  ? ? ?Clinical ASCVD: Yes  ?The ASCVD Risk score (Arnett DK, et al., 2019) failed to calculate for the following reasons: ?  The 2019 ASCVD risk score is only valid for ages 29 to 57   ? ?Depression screen [facility] 2/9 11/06/2021 04/07/2021 09/09/2020  ?Decreased Interest 0 0 0  ?Down, Depressed, Hopeless 0 0 1  ?PHQ - 2 Score 0 0 1  ?Altered sleeping - - -  ?Tired, decreased energy - - -  ?Change in appetite - - -  ?Feeling bad or failure about yourself  - - -  ?Trouble concentrating - - -  ?Moving slowly or fidgety/restless - - -  ?Suicidal thoughts - - -  ?PHQ-9 Score - - -  ?Difficult doing work/chores - - -  ?Some recent data might be hidden  ?  ?CHA2DS2/VAS Stroke Risk Points  Current as of 10 minutes ago  ?   6 >= 2 Points: High Risk  ?1 - 1.99 Points: Medium Risk  ?0 Points: Low Risk  ?  Last Change: N/A   ?  ? Details   ? This score determines the patient's risk of having a stroke if the  ?patient has atrial fibrillation.  ?  ?  ? Points Metrics  ?1 Has Congestive Heart Failure:  Yes   ? Current as of 10 minutes ago  ?1 Has Vascular Disease:  Yes   ? Current as of 10 minutes ago  ?1 Has Hypertension:  Yes   ? Current as of 10 minutes ago  ?2 Age:  62   ? Current as of 10 minutes ago  ?0 Has Diabetes:  No   ? Current as of 10 minutes ago  ?0 Had Stroke:  No  Had TIA:  No  Had Thromboembolism:  No   ? Current as of 10 minutes ago  ?1 Female:  Yes    ? Current as of 10 minutes ago  ?  ?  ?  ? ?Social History  ? ?Tobacco Use  ?Smoking Status Former  ? Packs/day: 1.00  ? Years: 15.00  ? Pack years: 15.00  ? Types: Cigarettes  ? Quit date: 09/28/1975  ? Years since quitting: 46.2  ?Smokeless Tobacco Never  ? ?BP Readings from Last 3 Encounters:  ?11/30/21 128/60  ?11/19/21 122/70  ?  11/16/21 (!) 132/58  ? ?Pulse Readings from Last 3 Encounters:  ?11/30/21 65  ?11/19/21 71  ?11/16/21 73  ? ?Wt Readings from Last 3 Encounters:  ?11/30/21 147 lb 1.6 oz (66.7 kg)  ?11/19/21 146 lb 11.2 oz (66.5 kg)  ?11/16/21 144 lb (65.3 kg)  ? ?BMI Readings from Last 3 Encounters:  ?11/30/21 26.06 kg/m?  ?11/19/21 25.99 kg/m?  ?11/16/21 25.51 kg/m?  ? ? ?Assessment/Interventions: Review of patient past medical history, allergies, medications, health status, including review of consultants reports, laboratory and other test data, was performed as part of comprehensive evaluation and provision of chronic care management services.  ? ?SDOH:  (Social Determinants of Health) assessments and interventions performed: No ? ?SDOH Screenings  ? ?Alcohol Screen: Low Risk   ? Last Alcohol Screening Score (AUDIT): 0  ?Depression (PHQ2-9): Low Risk   ? PHQ-2 Score: 0  ?Financial Resource Strain: Low Risk   ? Difficulty of Paying Living Expenses: Not hard at all  ?Food Insecurity: No Food Insecurity  ? Worried About Charity fundraiser in the Last Year: Never true  ? Ran Out of Food in the Last Year: Never true  ?Housing: Low Risk   ? Last Housing Risk Score: 0  ?Physical Activity: Inactive  ? Days of Exercise per Week: 0 days  ? Minutes of Exercise per Session: 0 min  ?Social Connections: Moderately Integrated  ? Frequency of Communication with Friends and Family: Twice a week  ? Frequency of Social Gatherings with Friends and Family: Twice a week  ? Attends Religious Services: More than 4 times per year  ? Active Member of Clubs or Organizations: No  ? Attends Archivist Meetings: Never   ? Marital Status: Married  ?Stress: No Stress Concern Present  ? Feeling of Stress : Not at all  ?Tobacco Use: Medium Risk  ? Smoking Tobacco Use: Former  ? Smokeless Tobacco Use: Never  ? Passive Exposure

## 2021-12-21 ENCOUNTER — Ambulatory Visit (INDEPENDENT_AMBULATORY_CARE_PROVIDER_SITE_OTHER): Payer: Medicare Other | Admitting: Family Medicine

## 2021-12-21 ENCOUNTER — Encounter: Payer: Self-pay | Admitting: Family Medicine

## 2021-12-21 ENCOUNTER — Ambulatory Visit (INDEPENDENT_AMBULATORY_CARE_PROVIDER_SITE_OTHER): Payer: Medicare Other

## 2021-12-21 ENCOUNTER — Other Ambulatory Visit: Payer: Medicare Other

## 2021-12-21 VITALS — BP 124/70 | HR 83 | Temp 98.1°F | Ht 63.0 in | Wt 152.6 lb

## 2021-12-21 DIAGNOSIS — E876 Hypokalemia: Secondary | ICD-10-CM

## 2021-12-21 DIAGNOSIS — T45515A Adverse effect of anticoagulants, initial encounter: Secondary | ICD-10-CM

## 2021-12-21 DIAGNOSIS — D6832 Hemorrhagic disorder due to extrinsic circulating anticoagulants: Secondary | ICD-10-CM | POA: Diagnosis not present

## 2021-12-21 DIAGNOSIS — R300 Dysuria: Secondary | ICD-10-CM | POA: Diagnosis not present

## 2021-12-21 DIAGNOSIS — R600 Localized edema: Secondary | ICD-10-CM

## 2021-12-21 DIAGNOSIS — R635 Abnormal weight gain: Secondary | ICD-10-CM | POA: Diagnosis not present

## 2021-12-21 LAB — POC URINALSYSI DIPSTICK (AUTOMATED)
Bilirubin, UA: NEGATIVE
Blood, UA: NEGATIVE
Glucose, UA: NEGATIVE
Ketones, UA: NEGATIVE
Leukocytes, UA: NEGATIVE
Nitrite, UA: NEGATIVE
Protein, UA: NEGATIVE
Spec Grav, UA: 1.015 (ref 1.010–1.025)
Urobilinogen, UA: NEGATIVE E.U./dL — AB
pH, UA: 7 (ref 5.0–8.0)

## 2021-12-21 LAB — COMPREHENSIVE METABOLIC PANEL
ALT: 16 U/L (ref 0–35)
AST: 25 U/L (ref 0–37)
Albumin: 4.1 g/dL (ref 3.5–5.2)
Alkaline Phosphatase: 63 U/L (ref 39–117)
BUN: 13 mg/dL (ref 6–23)
CO2: 31 mEq/L (ref 19–32)
Calcium: 9.2 mg/dL (ref 8.4–10.5)
Chloride: 103 mEq/L (ref 96–112)
Creatinine, Ser: 0.86 mg/dL (ref 0.40–1.20)
GFR: 62.02 mL/min (ref >60.00)
Glucose, Bld: 109 mg/dL — ABNORMAL HIGH (ref 70–99)
Potassium: 3.2 mEq/L — ABNORMAL LOW (ref 3.5–5.1)
Sodium: 143 mEq/L (ref 135–145)
Total Bilirubin: 0.5 mg/dL (ref 0.2–1.2)
Total Protein: 6.5 g/dL (ref 6.0–8.3)

## 2021-12-21 LAB — POCT INR: INR: 1.8 — AB (ref 2.0–3.0)

## 2021-12-21 LAB — MICROALBUMIN / CREATININE URINE RATIO
Creatinine,U: 13 mg/dL
Microalb Creat Ratio: 12 mg/g (ref 0.0–30.0)
Microalb, Ur: 1.6 mg/dL (ref 0.0–1.9)

## 2021-12-21 NOTE — Patient Instructions (Addendum)
Pre visit review using our clinic review tool, if applicable. No additional management support is needed unless otherwise documented below in the visit note. ? ?Increase dose today to take 1 1/2 tablets and then continue 1 tablet daily except take 1/2 tablet on Wednesdays and Saturdays. ?Recheck in 3 weeks.  ?

## 2021-12-21 NOTE — Progress Notes (Signed)
? ?Established Patient Office Visit ? ?Subjective:  ?Patient ID: Catherine [name], female    DOB: [birth year]  Age: 87 y.o. MRN: [id number] ? ?CC:  ?Chief Complaint  ?Patient presents with  ? Follow-up  ? Edema  ?  Patient complains of Bi-laterlal feet swelling, x1 week   ? ? ?HPI ?Catherine [name] presents for several items ? ?Her major complaint is bilateral lower leg and especially foot and ankle edema.  This has been swelling to the extent that she cannot wear most of her shoes.  She is followed by cardiology and has recently been taking torsemide 20 mg up to 3 tablets in the morning and 3 tablets at night without much improvement.  Edema does seem to go down at night.  She has used compression stockings in the past but struggles to get these on and has some associated pain.  No increased dyspnea.  No orthopnea.  Recent albumin 3.7. ? ?Her weight is up about 8 pounds since February 20.  No dietary changes.  Tries to watch her sodium intake. ? ?Patient had recent ulcerative type lesion nasal septum on the right side.  Seem to be improving some with Bactroban.  No further bleeding.  No rhinorrhea.  She feels better overall.  Less fatigue.  Improving appetite. ? ?She does complain of some increased odor with her urine and occasional cloudy urine but no burning with urination.  She has had frequent UTIs in the past.  Was on prophylaxis with low-dose Keflex and this was taken off during hospitalization.  She was requesting repeat urine today ? ?Past Medical History:  ?Diagnosis Date  ? Acute on chronic diastolic heart failure ([place])   ? Aortic stenosis, mild 07/26/2017  ? Arthritis   ? Asthma   ? last attack 02/2015  ? Atrial fibrillation, chronic ([place])   ? Cellulitis of left lower extremity 08/17/2016  ? Ulcer associated with severe venous insufficiency  ? Chronic anticoagulation   ? Chronic diastolic CHF (congestive heart failure) ([place])   ? Chronic kidney disease   ? "RIGHT MANY KIDNEY INFECTIONS AND STONES"  ? COPD  (chronic obstructive pulmonary disease) ([place])   ? Coronary artery disease   ? Dizziness   ? H/O: rheumatic fever   ? Heart murmur   ? Hypertension   ? PONV (postoperative nausea and vomiting)   ? ' SOMETIMES', BUT NOT ALWAYS"  ? S/P Maze operation for atrial fibrillation 10/14/2016  ? Complete bilateral atrial lesion set using cryothermy and bipolar radiofrequency ablation - atrial appendage was not treated due to previous surgical procedure (open mitral commissurotomy)  ? S/P mitral valve replacement with bioprosthetic valve 10/14/2016  ? 29 mm Medtronic Mosaic porcine bioprosthetic tissue valve  ? Tricuspid regurgitation 07/14/2021  ? UTI (urinary tract infection)   ? Valvular heart disease   ? Has mitral stenosis with prior mitral commissurotomy in 1970  ? ? ?Past Surgical History:  ?Procedure Laterality Date  ? ABDOMINAL HYSTERECTOMY  1983  ? endometriosis  ? APPENDECTOMY    ? BACK SURGERY    ? neurosurgery x2  ? CARDIAC CATHETERIZATION    ? CARDIAC CATHETERIZATION N/A 08/03/2016  ? Procedure: Right/Left Heart Cath and Coronary Angiography;  Surgeon: Peter M Martinique, MD;  Location: [place] [facility];  Service: Cardiovascular;  Laterality: N/A;  ? cataract surg    ? CHOLECYSTECTOMY N/A 05/30/2015  ? Procedure: LAPAROSCOPIC CHOLECYSTECTOMY WITH INTRAOPERATIVE CHOLANGIOGRAM;  Surgeon: Excell Seltzer, MD;  Location: [place];  Service: General;  Laterality: N/A;  ? COLONOSCOPY    ? CORONARY ARTERY BYPASS GRAFT N/A 10/14/2016  ? Procedure: CORONARY ARTERY BYPASS GRAFTING (CABG);  Surgeon: Rexene Alberts, MD;  Location: [place];  Service: Open Heart Surgery;  Laterality: N/A;  ? EYE SURGERY    ? MAZE N/A 10/14/2016  ? Procedure: MAZE;  Surgeon: Rexene Alberts, MD;  Location: [place];  Service: Open Heart Surgery;  Laterality: N/A;  ? MITRAL VALVE REPLACEMENT N/A 10/14/2016  ? Procedure: REDO MITRAL VALVE REPLACEMENT (MVR);  Surgeon: Rexene Alberts, MD;  Location: [place];  Service: Open Heart Surgery;  Laterality: N/A;  ? MITRAL  VALVE SURGERY Left 1970  ? Open mitral commissurotomy via left thoracotomy approach  ? TEE WITHOUT CARDIOVERSION N/A 07/05/2016  ? Procedure: TRANSESOPHAGEAL ECHOCARDIOGRAM (TEE);  Surgeon: [name], MD;  Location: [place];  Service: Cardiovascular;  Laterality: N/A;  ? TEE WITHOUT CARDIOVERSION N/A 10/14/2016  ? Procedure: TRANSESOPHAGEAL ECHOCARDIOGRAM (TEE);  Surgeon: Rexene Alberts, MD;  Location: [place];  Service: Open Heart Surgery;  Laterality: N/A;  ? ? ?Family History  ?Problem Relation Age of Onset  ? Leukemia Father   ? Breast cancer Neg Hx   ? ? ?Social History  ? ?Socioeconomic History  ? Marital status: Married  ?  Spouse name: Not on file  ? Number of children: Not on file  ? Years of education: Not on file  ? Highest education level: Not on file  ?Occupational History  ? Not on file  ?Tobacco Use  ? Smoking status: Former  ?  Packs/day: 1.00  ?  Years: 15.00  ?  Pack years: 15.00  ?  Types: Cigarettes  ?  Quit date: 09/28/1975  ?  Years since quitting: 46.2  ? Smokeless tobacco: Never  ?Vaping Use  ? Vaping Use: Never used  ?Substance and Sexual Activity  ? Alcohol use: No  ?  Alcohol/week: 0.0 standard drinks  ? Drug use: No  ? Sexual activity: Yes  ?Other Topics Concern  ? Not on file  ?Social History Narrative  ? Not on file  ? ?Social Determinants of Health  ? ?Financial Resource Strain: Low Risk   ? Difficulty of Paying Living Expenses: Not hard at all  ?Food Insecurity: No Food Insecurity  ? Worried About Charity fundraiser in the Last Year: Never true  ? Ran Out of Food in the Last Year: Never true  ?Transportation Needs: No Transportation Needs  ? Lack of Transportation (Medical): No  ? Lack of Transportation (Non-Medical): No  ?Physical Activity: Inactive  ? Days of Exercise per Week: 0 days  ? Minutes of Exercise per Session: 0 min  ?Stress: No Stress Concern Present  ? Feeling of Stress : Not at all  ?Social Connections: Moderately Integrated  ? Frequency of Communication with  Friends and Family: Twice a week  ? Frequency of Social Gatherings with Friends and Family: Twice a week  ? Attends Religious Services: More than 4 times per year  ? Active Member of Clubs or Organizations: No  ? Attends Archivist Meetings: Never  ? Marital Status: Married  ?Intimate Partner Violence: Not At Risk  ? Fear of Current or Ex-Partner: No  ? Emotionally Abused: No  ? Physically Abused: No  ? Sexually Abused: No  ? ? ?Outpatient Medications Prior to Visit  ?Medication Sig Dispense Refill  ? acetaminophen (TYLENOL) 500 MG tablet Take 1,000 mg by mouth every 6 (six) hours as needed  for mild pain.    ? albuterol (VENTOLIN HFA) 108 (90 Base) MCG/ACT inhaler INHALE 2 PUFFS INTO THE LUNGS EVERY 4 HOURS AS NEEDED FOR AHEEZING OR SHORTNESS OF BREATH 8.5 g 2  ? cholecalciferol (VITAMIN D3) 25 MCG (1000 UNIT) tablet Take 1,000 Units by mouth daily.    ? diazepam (VALIUM) 5 MG tablet TAKE ONE TABLET AT BEDTIME 30 tablet 5  ? doxazosin (CARDURA) 8 MG tablet TAKE 1 TABLET DAILY (Patient taking differently: Take 8 mg by mouth daily.) 90 tablet 3  ? HYDROcodone-acetaminophen (NORCO/VICODIN) 5-325 MG tablet Take 1 tablet by mouth 3 (three) times daily as needed for moderate pain. 10 tablet 0  ? metoprolol succinate (TOPROL-XL) 25 MG 24 hr tablet TAKE 1 TABLET DAILY (Patient taking differently: Take 25 mg by mouth daily.) 90 tablet 1  ? Multiple Vitamins-Minerals (HAIR/SKIN/NAILS/BIOTIN PO) Take 1 tablet by mouth daily.    ? Multiple Vitamins-Minerals (MULTIVITAMIN ADULT EXTRA C) CHEW Chew 2 tablets by mouth daily.    ? mupirocin ointment (BACTROBAN) 2 % Apply 1 application. topically 2 (two) times daily. 22 g 2  ? ondansetron (ZOFRAN) 4 MG tablet Take 1 tablet (4 mg total) by mouth every 8 (eight) hours as needed for nausea or vomiting. 15 tablet 0  ? potassium chloride (KLOR-CON) 10 MEQ tablet TAKE UP TO 8 TABLETS DAILY AS DIRECTED (Patient taking differently: Take 40 mEq by mouth 2 (two) times daily.) 720  tablet 3  ? rosuvastatin (CRESTOR) 5 MG tablet Take one tablet every Monday, wednesday Friday and Sunday (Patient taking differently: Take 5 mg by mouth See admin instructions. '5mg'$  Monday's, Wednesday's,  [place]

## 2021-12-21 NOTE — Progress Notes (Signed)
Increase dose today to take 1 1/2 tablets and then continue 1 tablet daily except take 1/2 tablet on Wednesdays and Saturdays. ?Recheck in 3 weeks.  ?

## 2021-12-22 NOTE — Addendum Note (Signed)
Addended by: Nilda Riggs on: 12/22/2021 09:57 AM ? ? Modules accepted: Orders ? ?

## 2021-12-25 DIAGNOSIS — I2510 Atherosclerotic heart disease of native coronary artery without angina pectoris: Secondary | ICD-10-CM

## 2021-12-25 DIAGNOSIS — I10 Essential (primary) hypertension: Secondary | ICD-10-CM

## 2021-12-25 DIAGNOSIS — J441 Chronic obstructive pulmonary disease with (acute) exacerbation: Secondary | ICD-10-CM

## 2021-12-25 DIAGNOSIS — I4891 Unspecified atrial fibrillation: Secondary | ICD-10-CM | POA: Diagnosis not present

## 2021-12-25 DIAGNOSIS — I5033 Acute on chronic diastolic (congestive) heart failure: Secondary | ICD-10-CM | POA: Diagnosis not present

## 2021-12-30 ENCOUNTER — Other Ambulatory Visit: Payer: Self-pay | Admitting: Family Medicine

## 2022-01-05 ENCOUNTER — Other Ambulatory Visit: Payer: Medicare Other

## 2022-01-05 ENCOUNTER — Other Ambulatory Visit: Payer: Self-pay

## 2022-01-05 ENCOUNTER — Emergency Department (HOSPITAL_BASED_OUTPATIENT_CLINIC_OR_DEPARTMENT_OTHER): Payer: Medicare Other | Admitting: Radiology

## 2022-01-05 ENCOUNTER — Telehealth: Payer: Self-pay | Admitting: Family Medicine

## 2022-01-05 ENCOUNTER — Inpatient Hospital Stay (HOSPITAL_BASED_OUTPATIENT_CLINIC_OR_DEPARTMENT_OTHER)
Admission: EM | Admit: 2022-01-05 | Discharge: 2022-01-12 | DRG: 291 | Disposition: A | Discharge status: Home / Self Care | Payer: Medicare Other | Attending: Internal Medicine | Admitting: Internal Medicine

## 2022-01-05 ENCOUNTER — Encounter (HOSPITAL_BASED_OUTPATIENT_CLINIC_OR_DEPARTMENT_OTHER): Payer: Self-pay

## 2022-01-05 DIAGNOSIS — F5104 Psychophysiologic insomnia: Secondary | ICD-10-CM | POA: Diagnosis present

## 2022-01-05 DIAGNOSIS — I959 Hypotension, unspecified: Secondary | ICD-10-CM | POA: Diagnosis not present

## 2022-01-05 DIAGNOSIS — I10 Essential (primary) hypertension: Secondary | ICD-10-CM | POA: Diagnosis present

## 2022-01-05 DIAGNOSIS — F419 Anxiety disorder, unspecified: Secondary | ICD-10-CM | POA: Diagnosis present

## 2022-01-05 DIAGNOSIS — I361 Nonrheumatic tricuspid (valve) insufficiency: Secondary | ICD-10-CM | POA: Diagnosis not present

## 2022-01-05 DIAGNOSIS — I4819 Other persistent atrial fibrillation: Secondary | ICD-10-CM | POA: Diagnosis not present

## 2022-01-05 DIAGNOSIS — I130 Hypertensive heart and chronic kidney disease with heart failure and stage 1 through stage 4 chronic kidney disease, or unspecified chronic kidney disease: Principal | ICD-10-CM | POA: Diagnosis present

## 2022-01-05 DIAGNOSIS — R059 Cough, unspecified: Secondary | ICD-10-CM | POA: Diagnosis not present

## 2022-01-05 DIAGNOSIS — R031 Nonspecific low blood-pressure reading: Secondary | ICD-10-CM | POA: Diagnosis not present

## 2022-01-05 DIAGNOSIS — N189 Chronic kidney disease, unspecified: Secondary | ICD-10-CM | POA: Diagnosis present

## 2022-01-05 DIAGNOSIS — E1122 Type 2 diabetes mellitus with diabetic chronic kidney disease: Secondary | ICD-10-CM | POA: Diagnosis present

## 2022-01-05 DIAGNOSIS — Z951 Presence of aortocoronary bypass graft: Secondary | ICD-10-CM

## 2022-01-05 DIAGNOSIS — J90 Pleural effusion, not elsewhere classified: Secondary | ICD-10-CM | POA: Diagnosis not present

## 2022-01-05 DIAGNOSIS — I4891 Unspecified atrial fibrillation: Secondary | ICD-10-CM | POA: Diagnosis present

## 2022-01-05 DIAGNOSIS — E876 Hypokalemia: Secondary | ICD-10-CM | POA: Diagnosis present

## 2022-01-05 DIAGNOSIS — Z885 Allergy status to narcotic agent status: Secondary | ICD-10-CM

## 2022-01-05 DIAGNOSIS — N1831 Chronic kidney disease, stage 3a: Secondary | ICD-10-CM | POA: Diagnosis not present

## 2022-01-05 DIAGNOSIS — Z79899 Other long term (current) drug therapy: Secondary | ICD-10-CM

## 2022-01-05 DIAGNOSIS — Z7951 Long term (current) use of inhaled steroids: Secondary | ICD-10-CM | POA: Diagnosis not present

## 2022-01-05 DIAGNOSIS — M1990 Unspecified osteoarthritis, unspecified site: Secondary | ICD-10-CM | POA: Diagnosis not present

## 2022-01-05 DIAGNOSIS — J441 Chronic obstructive pulmonary disease with (acute) exacerbation: Secondary | ICD-10-CM | POA: Diagnosis present

## 2022-01-05 DIAGNOSIS — I872 Venous insufficiency (chronic) (peripheral): Secondary | ICD-10-CM | POA: Diagnosis not present

## 2022-01-05 DIAGNOSIS — I509 Heart failure, unspecified: Secondary | ICD-10-CM | POA: Insufficient documentation

## 2022-01-05 DIAGNOSIS — Z90710 Acquired absence of both cervix and uterus: Secondary | ICD-10-CM

## 2022-01-05 DIAGNOSIS — I25118 Atherosclerotic heart disease of native coronary artery with other forms of angina pectoris: Secondary | ICD-10-CM | POA: Diagnosis not present

## 2022-01-05 DIAGNOSIS — I2510 Atherosclerotic heart disease of native coronary artery without angina pectoris: Secondary | ICD-10-CM | POA: Diagnosis not present

## 2022-01-05 DIAGNOSIS — I517 Cardiomegaly: Secondary | ICD-10-CM | POA: Diagnosis not present

## 2022-01-05 DIAGNOSIS — J209 Acute bronchitis, unspecified: Secondary | ICD-10-CM | POA: Diagnosis present

## 2022-01-05 DIAGNOSIS — E785 Hyperlipidemia, unspecified: Secondary | ICD-10-CM | POA: Diagnosis present

## 2022-01-05 DIAGNOSIS — Z9049 Acquired absence of other specified parts of digestive tract: Secondary | ICD-10-CM

## 2022-01-05 DIAGNOSIS — K5900 Constipation, unspecified: Secondary | ICD-10-CM | POA: Diagnosis not present

## 2022-01-05 DIAGNOSIS — J9601 Acute respiratory failure with hypoxia: Secondary | ICD-10-CM | POA: Diagnosis not present

## 2022-01-05 DIAGNOSIS — I495 Sick sinus syndrome: Secondary | ICD-10-CM | POA: Diagnosis present

## 2022-01-05 DIAGNOSIS — Z7901 Long term (current) use of anticoagulants: Secondary | ICD-10-CM | POA: Diagnosis not present

## 2022-01-05 DIAGNOSIS — Z8616 Personal history of COVID-19: Secondary | ICD-10-CM

## 2022-01-05 DIAGNOSIS — I5033 Acute on chronic diastolic (congestive) heart failure: Principal | ICD-10-CM | POA: Insufficient documentation

## 2022-01-05 DIAGNOSIS — J449 Chronic obstructive pulmonary disease, unspecified: Secondary | ICD-10-CM | POA: Diagnosis not present

## 2022-01-05 DIAGNOSIS — J440 Chronic obstructive pulmonary disease with acute lower respiratory infection: Secondary | ICD-10-CM | POA: Diagnosis present

## 2022-01-05 DIAGNOSIS — I4811 Longstanding persistent atrial fibrillation: Secondary | ICD-10-CM | POA: Diagnosis not present

## 2022-01-05 DIAGNOSIS — Z87891 Personal history of nicotine dependence: Secondary | ICD-10-CM

## 2022-01-05 DIAGNOSIS — Z953 Presence of xenogenic heart valve: Secondary | ICD-10-CM | POA: Diagnosis not present

## 2022-01-05 DIAGNOSIS — Z883 Allergy status to other anti-infective agents status: Secondary | ICD-10-CM

## 2022-01-05 DIAGNOSIS — R0602 Shortness of breath: Secondary | ICD-10-CM | POA: Diagnosis not present

## 2022-01-05 DIAGNOSIS — Z888 Allergy status to other drugs, medicaments and biological substances status: Secondary | ICD-10-CM

## 2022-01-05 DIAGNOSIS — Z952 Presence of prosthetic heart valve: Secondary | ICD-10-CM | POA: Diagnosis not present

## 2022-01-05 DIAGNOSIS — I110 Hypertensive heart disease with heart failure: Secondary | ICD-10-CM | POA: Diagnosis not present

## 2022-01-05 LAB — TROPONIN I (HIGH SENSITIVITY)
Troponin I (High Sensitivity): 10 ng/L (ref <18)
Troponin I (High Sensitivity): 10 ng/L (ref <18)

## 2022-01-05 LAB — CBC WITH DIFFERENTIAL/PLATELET
Abs Immature Granulocytes: 0.02 10*3/uL (ref 0.00–0.07)
Basophils Absolute: 0 10*3/uL (ref 0.0–0.1)
Basophils Relative: 0 %
Eosinophils Absolute: 0.1 10*3/uL (ref 0.0–0.5)
Eosinophils Relative: 1 %
HCT: 38.5 % (ref 36.0–46.0)
Hemoglobin: 12.6 g/dL (ref 12.0–15.0)
Immature Granulocytes: 0 %
Lymphocytes Relative: 11 %
Lymphs Abs: 0.7 10*3/uL (ref 0.7–4.0)
MCH: 30.6 pg (ref 26.0–34.0)
MCHC: 32.7 g/dL (ref 30.0–36.0)
MCV: 93.4 fL (ref 80.0–100.0)
Monocytes Absolute: 0.6 10*3/uL (ref 0.1–1.0)
Monocytes Relative: 9 %
Neutro Abs: 5.2 10*3/uL (ref 1.7–7.7)
Neutrophils Relative %: 79 %
Platelets: 128 10*3/uL — ABNORMAL LOW (ref 150–400)
RBC: 4.12 MIL/uL (ref 3.87–5.11)
RDW: 13.2 % (ref 11.5–15.5)
WBC: 6.7 10*3/uL (ref 4.0–10.5)
nRBC: 0 % (ref 0.0–0.2)

## 2022-01-05 LAB — COMPREHENSIVE METABOLIC PANEL
ALT: 21 U/L (ref 0–44)
AST: 19 U/L (ref 15–41)
Albumin: 4.1 g/dL (ref 3.5–5.0)
Alkaline Phosphatase: 87 U/L (ref 38–126)
Anion gap: 12 (ref 5–15)
BUN: 21 mg/dL (ref 8–23)
CO2: 24 mmol/L (ref 22–32)
Calcium: 9.4 mg/dL (ref 8.9–10.3)
Chloride: 103 mmol/L (ref 98–111)
Creatinine, Ser: 0.97 mg/dL (ref 0.44–1.00)
GFR, Estimated: 58 mL/min — ABNORMAL LOW (ref >60)
Glucose, Bld: 128 mg/dL — ABNORMAL HIGH (ref 70–99)
Potassium: 3.3 mmol/L — ABNORMAL LOW (ref 3.5–5.1)
Sodium: 139 mmol/L (ref 135–145)
Total Bilirubin: 0.2 mg/dL — ABNORMAL LOW (ref 0.3–1.2)
Total Protein: 7.1 g/dL (ref 6.5–8.1)

## 2022-01-05 LAB — PROTIME-INR
INR: 3 — ABNORMAL HIGH (ref 0.8–1.2)
Prothrombin Time: 30.6 seconds — ABNORMAL HIGH (ref 11.4–15.2)

## 2022-01-05 LAB — MRSA NEXT GEN BY PCR, NASAL: MRSA by PCR Next Gen: NOT DETECTED

## 2022-01-05 LAB — BRAIN NATRIURETIC PEPTIDE: B Natriuretic Peptide: 184.1 pg/mL — ABNORMAL HIGH (ref 0.0–100.0)

## 2022-01-05 LAB — PROCALCITONIN: Procalcitonin: 0.11 ng/mL

## 2022-01-05 LAB — TSH: TSH: 1.716 u[IU]/mL (ref 0.350–4.500)

## 2022-01-05 LAB — MAGNESIUM: Magnesium: 1.9 mg/dL (ref 1.7–2.4)

## 2022-01-05 MED ORDER — BUDESONIDE 0.5 MG/2ML IN SUSP
0.5000 mg | Freq: Two times a day (BID) | RESPIRATORY_TRACT | Status: DC
  Administered 2022-01-05 – 2022-01-12 (×14): 0.5 mg via RESPIRATORY_TRACT
  Filled 2022-01-05 (×14): qty 2

## 2022-01-05 MED ORDER — GUAIFENESIN ER 600 MG PO TB12
600.0000 mg | ORAL_TABLET | Freq: Two times a day (BID) | ORAL | Status: DC
  Administered 2022-01-05 – 2022-01-12 (×15): 600 mg via ORAL
  Filled 2022-01-05 (×16): qty 1

## 2022-01-05 MED ORDER — DIAZEPAM 5 MG PO TABS
5.0000 mg | ORAL_TABLET | Freq: Every evening | ORAL | Status: DC | PRN
  Administered 2022-01-05 – 2022-01-07 (×3): 5 mg via ORAL
  Filled 2022-01-05 (×3): qty 1

## 2022-01-05 MED ORDER — PREDNISONE 20 MG PO TABS
40.0000 mg | ORAL_TABLET | Freq: Every day | ORAL | Status: DC
  Administered 2022-01-05 – 2022-01-06 (×2): 40 mg via ORAL
  Filled 2022-01-05 (×2): qty 2

## 2022-01-05 MED ORDER — ROSUVASTATIN CALCIUM 5 MG PO TABS
5.0000 mg | ORAL_TABLET | ORAL | Status: DC
  Administered 2022-01-07 – 2022-01-12 (×4): 5 mg via ORAL
  Filled 2022-01-05 (×4): qty 1

## 2022-01-05 MED ORDER — POTASSIUM CHLORIDE 10 MEQ/100ML IV SOLN
10.0000 meq | Freq: Once | INTRAVENOUS | Status: DC
Start: 2022-01-05 — End: 2022-01-05
  Filled 2022-01-05: qty 100

## 2022-01-05 MED ORDER — ALBUTEROL SULFATE (2.5 MG/3ML) 0.083% IN NEBU
2.5000 mg | INHALATION_SOLUTION | RESPIRATORY_TRACT | Status: DC | PRN
  Administered 2022-01-06 – 2022-01-08 (×4): 2.5 mg via RESPIRATORY_TRACT
  Filled 2022-01-05 (×4): qty 3

## 2022-01-05 MED ORDER — ARFORMOTEROL TARTRATE 15 MCG/2ML IN NEBU
15.0000 ug | INHALATION_SOLUTION | Freq: Two times a day (BID) | RESPIRATORY_TRACT | Status: DC
  Administered 2022-01-05 – 2022-01-12 (×14): 15 ug via RESPIRATORY_TRACT
  Filled 2022-01-05 (×14): qty 2

## 2022-01-05 MED ORDER — WARFARIN - PHARMACIST DOSING INPATIENT: Freq: Every day | Status: DC

## 2022-01-05 MED ORDER — ORAL CARE MOUTH RINSE
15.0000 mL | Freq: Two times a day (BID) | OROMUCOSAL | Status: DC
  Administered 2022-01-05 – 2022-01-11 (×10): 15 mL via OROMUCOSAL

## 2022-01-05 MED ORDER — GUAIFENESIN-DM 100-10 MG/5ML PO SYRP
5.0000 mL | ORAL_SOLUTION | ORAL | Status: DC | PRN
  Administered 2022-01-05 – 2022-01-10 (×4): 5 mL via ORAL
  Filled 2022-01-05 (×4): qty 5

## 2022-01-05 MED ORDER — ONDANSETRON HCL 4 MG/2ML IJ SOLN: 4.0000 mg | Freq: Four times a day (QID) | INTRAMUSCULAR | Status: DC | PRN

## 2022-01-05 MED ORDER — ACETAMINOPHEN 325 MG PO TABS
650.0000 mg | ORAL_TABLET | ORAL | Status: DC | PRN
  Administered 2022-01-05 – 2022-01-11 (×3): 650 mg via ORAL
  Filled 2022-01-05 (×3): qty 2

## 2022-01-05 MED ORDER — SODIUM CHLORIDE 0.9% FLUSH: 3.0000 mL | INTRAVENOUS | Status: DC | PRN

## 2022-01-05 MED ORDER — ONDANSETRON HCL 4 MG/2ML IJ SOLN
4.0000 mg | Freq: Once | INTRAMUSCULAR | Status: AC
  Administered 2022-01-05: 4 mg via INTRAVENOUS
  Filled 2022-01-05: qty 2

## 2022-01-05 MED ORDER — FUROSEMIDE 10 MG/ML IJ SOLN
60.0000 mg | Freq: Once | INTRAMUSCULAR | Status: AC
Start: 2022-01-05 — End: 2022-01-05
  Administered 2022-01-05: 60 mg via INTRAVENOUS
  Filled 2022-01-05: qty 6

## 2022-01-05 MED ORDER — IPRATROPIUM-ALBUTEROL 0.5-2.5 (3) MG/3ML IN SOLN
3.0000 mL | Freq: Three times a day (TID) | RESPIRATORY_TRACT | Status: DC
Start: 2022-01-05 — End: 2022-01-08
  Administered 2022-01-05 – 2022-01-08 (×8): 3 mL via RESPIRATORY_TRACT
  Filled 2022-01-05 (×7): qty 3

## 2022-01-05 MED ORDER — DIAZEPAM 5 MG/ML IJ SOLN
2.5000 mg | Freq: Once | INTRAMUSCULAR | Status: AC
  Administered 2022-01-05: 2.5 mg via INTRAVENOUS
  Filled 2022-01-05: qty 2

## 2022-01-05 MED ORDER — METOPROLOL SUCCINATE ER 25 MG PO TB24
25.0000 mg | ORAL_TABLET | Freq: Every day | ORAL | Status: DC
  Administered 2022-01-06: 25 mg via ORAL
  Filled 2022-01-05 (×2): qty 1

## 2022-01-05 MED ORDER — POTASSIUM CHLORIDE CRYS ER 20 MEQ PO TBCR
40.0000 meq | EXTENDED_RELEASE_TABLET | Freq: Two times a day (BID) | ORAL | Status: DC
  Administered 2022-01-05 – 2022-01-07 (×5): 40 meq via ORAL
  Filled 2022-01-05 (×5): qty 2

## 2022-01-05 MED ORDER — ALBUTEROL SULFATE HFA 108 (90 BASE) MCG/ACT IN AERS: 2.0000 | INHALATION_SPRAY | RESPIRATORY_TRACT | Status: DC | PRN

## 2022-01-05 MED ORDER — POTASSIUM CHLORIDE CRYS ER 20 MEQ PO TBCR
40.0000 meq | EXTENDED_RELEASE_TABLET | Freq: Every day | ORAL | Status: DC
  Administered 2022-01-05: 40 meq via ORAL
  Filled 2022-01-05: qty 2

## 2022-01-05 MED ORDER — SODIUM CHLORIDE 0.9 % IV SOLN
1.0000 g | INTRAVENOUS | Status: DC
  Administered 2022-01-05 – 2022-01-07 (×3): 1 g via INTRAVENOUS
  Filled 2022-01-05 (×3): qty 10

## 2022-01-05 MED ORDER — SODIUM CHLORIDE 0.9% FLUSH
3.0000 mL | Freq: Two times a day (BID) | INTRAVENOUS | Status: DC
  Administered 2022-01-05 – 2022-01-08 (×7): 3 mL via INTRAVENOUS
  Administered 2022-01-08: 10 mL via INTRAVENOUS
  Administered 2022-01-09 – 2022-01-12 (×7): 3 mL via INTRAVENOUS

## 2022-01-05 MED ORDER — IPRATROPIUM-ALBUTEROL 0.5-2.5 (3) MG/3ML IN SOLN
3.0000 mL | Freq: Four times a day (QID) | RESPIRATORY_TRACT | Status: DC
  Administered 2022-01-05: 3 mL via RESPIRATORY_TRACT
  Filled 2022-01-05: qty 3

## 2022-01-05 MED ORDER — FUROSEMIDE 10 MG/ML IJ SOLN
60.0000 mg | Freq: Two times a day (BID) | INTRAMUSCULAR | Status: DC
  Administered 2022-01-05: 60 mg via INTRAVENOUS
  Filled 2022-01-05: qty 6

## 2022-01-05 MED ORDER — SODIUM CHLORIDE 0.9 % IV SOLN: 250.0000 mL | INTRAVENOUS | Status: DC | PRN

## 2022-01-05 MED ORDER — WARFARIN SODIUM 1 MG PO TABS
1.0000 mg | ORAL_TABLET | Freq: Once | ORAL | Status: AC
  Administered 2022-01-05: 1 mg via ORAL
  Filled 2022-01-05: qty 1

## 2022-01-05 MED ORDER — IPRATROPIUM-ALBUTEROL 0.5-2.5 (3) MG/3ML IN SOLN
3.0000 mL | RESPIRATORY_TRACT | Status: AC
  Administered 2022-01-05 (×3): 3 mL via RESPIRATORY_TRACT
  Filled 2022-01-05: qty 9

## 2022-01-05 NOTE — Assessment & Plan Note (Addendum)
Acute.  Potassium initially noted to be 3.3 on admission.  Patient was ordered potassium chloride 40 meq po.  Home regimen includes 80 mEq of potassium daily. ?-Continue potassium chloride 40 mEq twice daily ?-Adjust replacement as needed ?

## 2022-01-05 NOTE — H&P (Addendum)
?History and Physical  ? ? ?Patient: Catherine [name] WKG:[id number] DOB: Aug 24, 1935 ?DOA: 01/05/2022 ?DOS: the patient was seen and examined on 01/05/2022 ?PCP: [name], MD  ?Patient coming from: [facility] transfer ? ?Chief Complaint:  ?Chief Complaint  ?Patient presents with  ? Shortness of Breath  ? ?HPI: Catherine [name] is a 87 y.o. female with medical history significant of chronic diastolic CHF, moderate tricuspid regurg, CAD s/p CABG, rheumatic heart disease, chronic atrial fibrillation on Coumadin, and CKD stage III presents with complaints of progressively worsening shortness of breath over the last 4 days.  She complains of having a productive cough with beige to brownish sputum production.  Been taking Mucinex without improvement.  Associated symptoms include wheezing, leg swelling, weight from 147 pounds to 152 pounds.  Denies having any significant fever.  She has been abiding by low-sodium diet and taking furosemide 40 mg twice daily.  Last hospitalized 2/2-2/7 for acute respiratory failure with hypoxia related to a combination of COPD and diastolic heart failure. ? ?On admission to the emergency department patient was noted to be afebrile with respirations 15-23, blood pressures maintained, and O2 saturations as low as 87-88% on room air with improvement on 3 L of nasal cannula oxygen.  Labs significant for platelets 128, potassium 3.3, BNP 184.1, INR 3, and high-sensitivity troponins negative x2.  Chest x-ray noted cardiomegaly with small left effusion and mild diffuse interstitial opacity concerning for edema or inflammatory process.  Patient was suspected to be fluid overloaded and given Lasix 60 mg IV, Valium 2.5 mg IV, and DuoNeb breathing treatments. ? ?Review of Systems: As mentioned in the history of present illness. All other systems reviewed and are negative. ?Past Medical History:  ?Diagnosis Date  ? Acute on chronic diastolic heart failure ([place])   ? Aortic stenosis, mild 07/26/2017  ?  Arthritis   ? Asthma   ? last attack 02/2015  ? Atrial fibrillation, chronic ([place])   ? Cellulitis of left lower extremity 08/17/2016  ? Ulcer associated with severe venous insufficiency  ? Chronic anticoagulation   ? Chronic diastolic CHF (congestive heart failure) ([place])   ? Chronic kidney disease   ? "RIGHT MANY KIDNEY INFECTIONS AND STONES"  ? COPD (chronic obstructive pulmonary disease) ([place])   ? Coronary artery disease   ? Dizziness   ? H/O: rheumatic fever   ? Heart murmur   ? Hypertension   ? PONV (postoperative nausea and vomiting)   ? ' SOMETIMES', BUT NOT ALWAYS"  ? S/P Maze operation for atrial fibrillation 10/14/2016  ? Complete bilateral atrial lesion set using cryothermy and bipolar radiofrequency ablation - atrial appendage was not treated due to previous surgical procedure (open mitral commissurotomy)  ? S/P mitral valve replacement with bioprosthetic valve 10/14/2016  ? 29 mm Medtronic Mosaic porcine bioprosthetic tissue valve  ? Tricuspid regurgitation 07/14/2021  ? UTI (urinary tract infection)   ? Valvular heart disease   ? Has mitral stenosis with prior mitral commissurotomy in 1970  ? ?Past Surgical History:  ?Procedure Laterality Date  ? ABDOMINAL HYSTERECTOMY  1983  ? endometriosis  ? APPENDECTOMY    ? BACK SURGERY    ? neurosurgery x2  ? CARDIAC CATHETERIZATION    ? CARDIAC CATHETERIZATION N/A 08/03/2016  ? Procedure: Right/Left Heart Cath and Coronary Angiography;  Surgeon: Peter M Martinique, MD;  Location: [facility];  Service: Cardiovascular;  Laterality: N/A;  ? cataract surg    ? CHOLECYSTECTOMY N/A 05/30/2015  ? Procedure:  LAPAROSCOPIC CHOLECYSTECTOMY WITH INTRAOPERATIVE CHOLANGIOGRAM;  Surgeon: Excell Seltzer, MD;  Location: [facility];  Service: General;  Laterality: N/A;  ? COLONOSCOPY    ? CORONARY ARTERY BYPASS GRAFT N/A 10/14/2016  ? Procedure: CORONARY ARTERY BYPASS GRAFTING (CABG);  Surgeon: Rexene Alberts, MD;  Location: [facility];  Service: Open Heart Surgery;  Laterality: N/A;  ?  EYE SURGERY    ? MAZE N/A 10/14/2016  ? Procedure: MAZE;  Surgeon: Rexene Alberts, MD;  Location: [facility];  Service: Open Heart Surgery;  Laterality: N/A;  ? MITRAL VALVE REPLACEMENT N/A 10/14/2016  ? Procedure: REDO MITRAL VALVE REPLACEMENT (MVR);  Surgeon: Rexene Alberts, MD;  Location: [facility];  Service: Open Heart Surgery;  Laterality: N/A;  ? MITRAL VALVE SURGERY Left 1970  ? Open mitral commissurotomy via left thoracotomy approach  ? TEE WITHOUT CARDIOVERSION N/A 07/05/2016  ? Procedure: TRANSESOPHAGEAL ECHOCARDIOGRAM (TEE);  Surgeon: [name], MD;  Location: [facility];  Service: Cardiovascular;  Laterality: N/A;  ? TEE WITHOUT CARDIOVERSION N/A 10/14/2016  ? Procedure: TRANSESOPHAGEAL ECHOCARDIOGRAM (TEE);  Surgeon: Rexene Alberts, MD;  Location: [facility];  Service: Open Heart Surgery;  Laterality: N/A;  ? ?Social History:  reports that she quit smoking about 46 years ago. Her smoking use included cigarettes. She has a 15.00 pack-year smoking history. She has never used smokeless tobacco. She reports that she does not drink alcohol and does not use drugs. ? ?Allergies  ?Allergen Reactions  ? Aldactone [Spironolactone] Other (See Comments)  ?  dyspnea  ? Amoxicillin Palpitations  ?  Tachycardia ?Has patient had a PCN reaction causing immediate rash, facial/tongue/throat swelling, SOB or lightheadedness with hypotension: no ?Has patient had a PCN reaction causing severe rash involving mucus membranes or skin necrosis: {no ?Has patient had a PCN reaction that required hospitalization {no ?Has patient had a PCN reaction occurring within the last 10 years: {yes ?If all of the above answers are "NO", then may proceed with Cephalosporin use.  ? Diltiazem Other (See Comments)  ?  Causing headaches ?  ? Flagyl [Metronidazole Hcl] Other (See Comments)  ?  Causing headaches ? ?  ? Flovent [Fluticasone Propionate] Other (See Comments)  ?  Leg cramps  ? Gabapentin Swelling  ? Lyrica [Pregabalin] Swelling  ? Quinidine  Diarrhea and Other (See Comments)  ?  Fever diarrhea  ? Simvastatin Other (See Comments)  ?  Leg pain, myalgia  ? Tramadol Nausea Only  ? Verapamil Other (See Comments)  ?  myalgias  ? Amlodipine   ?  Low extremity edema  ? Crestor [Rosuvastatin]   ?  myalgia  ? Livalo [Pitavastatin]   ?  myalgias  ? Zetia [Ezetimibe]   ?  LEG CRAMPS ?  ? Ace Inhibitors Other (See Comments)  ?  unknown  ? Benazepril Hcl Cough  ? Ciprocin-Fluocin-Procin [Fluocinolone Acetonide] Other (See Comments)  ?  unknown  ? Ciprofloxacin Diarrhea  ? Codeine Nausea Only  ? Nitrofurantoin Monohyd Macro Nausea Only  ? ? ?Family History  ?Problem Relation Age of Onset  ? Leukemia Father   ? Breast cancer Neg Hx   ? ? ?Prior to Admission medications   ?Medication Sig Start Date End Date Taking? Authorizing Provider  ?acetaminophen (TYLENOL) 500 MG tablet Take 1,000 mg by mouth every 6 (six) hours as needed for mild pain.    [provider]  ?albuterol (VENTOLIN HFA) 108 (90 Base) MCG/ACT inhaler INHALE 2 PUFFS INTO THE LUNGS EVERY 4 HOURS AS NEEDED  FOR [facility] OR SHORTNESS OF BREATH 11/25/21   Tanda Rockers, MD  ?cholecalciferol (VITAMIN D3) 25 MCG (1000 UNIT) tablet Take 1,000 Units by mouth daily.    [provider]  ?diazepam (VALIUM) 5 MG tablet TAKE ONE TABLET AT BEDTIME 11/06/21   Burchette, Alinda Sierras, MD  ?doxazosin (CARDURA) 8 MG tablet TAKE 1 TABLET DAILY ?Patient taking differently: Take 8 mg by mouth daily. 07/20/21   [name], MD  ?HYDROcodone-acetaminophen (NORCO/VICODIN) 5-325 MG tablet Take 1 tablet by mouth 3 (three) times daily as needed for moderate pain. 11/03/21   Domenic Polite, MD  ?metoprolol succinate (TOPROL-XL) 25 MG 24 hr tablet TAKE 1 TABLET DAILY 12/30/21   Burchette, Alinda Sierras, MD  ?Multiple Vitamins-Minerals (HAIR/SKIN/NAILS/BIOTIN PO) Take 1 tablet by mouth daily.    [provider]  ?Multiple Vitamins-Minerals (MULTIVITAMIN ADULT EXTRA C) CHEW Chew 2 tablets by mouth daily.    [provider]  ?mupirocin ointment (BACTROBAN) 2 % Apply 1 application. topically 2 (two) times daily. 11/30/21   Burchette, Alinda Sierras, MD  ?ondansetron (ZOFRAN) 4 MG tablet Take 1 tablet (4 mg total) by mouth e

## 2022-01-05 NOTE — Assessment & Plan Note (Deleted)
Transient hypotension.  ? ?Blood pressure 130 to 160 mmHg ?Continue diuresis with furosemide and rate control atrial fibrillation with metoprolol.  ?

## 2022-01-05 NOTE — Assessment & Plan Note (Deleted)
Last echocardiogram revealed EF of 60 to 65% with indeterminant diastolic parameters with normal looking bioprosthetic MVR. ?

## 2022-01-05 NOTE — Assessment & Plan Note (Addendum)
No signs of acute coronary syndrome. ?Plan to continue aspirin and statin. ? ?Dyslipidemia, continue with rosuvastatin.   ?

## 2022-01-05 NOTE — ED Provider Notes (Signed)
?[facility] EMERGENCY DEPT ?Provider Note ? ?CSN: [id number] ?Arrival date & time: 01/05/22 0041 ? ?Chief Complaint(s) ?Shortness of Breath ? ?HPI ?Catherine [name] is a 87 y.o. female with a past medical history listed below radiation atrial fibrillation and mild, mitral valve replacement on Coumadin, chronic diastolic heart failure, asthma/COPD not on supplemental oxygen who presents to the emergency department several days of gradually worsening shortness of breath and dyspnea on exertion with worsening lower extremity edema.  She also endorses a dry cough and orthopnea.  She has been compliant with for torsemide.  No fevers.  No overt chest pain but reports she has epigastric abdominal discomfort with exertion. ? ? ?Shortness of Breath ? ?Past Medical History ?Past Medical History:  ?Diagnosis Date  ? Acute on chronic diastolic heart failure ([place])   ? Aortic stenosis, mild 07/26/2017  ? Arthritis   ? Asthma   ? last attack 02/2015  ? Atrial fibrillation, chronic ([place])   ? Cellulitis of left lower extremity 08/17/2016  ? Ulcer associated with severe venous insufficiency  ? Chronic anticoagulation   ? Chronic diastolic CHF (congestive heart failure) ([place])   ? Chronic kidney disease   ? "RIGHT MANY KIDNEY INFECTIONS AND STONES"  ? COPD (chronic obstructive pulmonary disease) ([place])   ? Coronary artery disease   ? Dizziness   ? H/O: rheumatic fever   ? Heart murmur   ? Hypertension   ? PONV (postoperative nausea and vomiting)   ? ' SOMETIMES', BUT NOT ALWAYS"  ? S/P Maze operation for atrial fibrillation 10/14/2016  ? Complete bilateral atrial lesion set using cryothermy and bipolar radiofrequency ablation - atrial appendage was not treated due to previous surgical procedure (open mitral commissurotomy)  ? S/P mitral valve replacement with bioprosthetic valve 10/14/2016  ? 29 mm Medtronic Mosaic porcine bioprosthetic tissue valve  ? Tricuspid regurgitation 07/14/2021  ? UTI (urinary tract infection)   ?  Valvular heart disease   ? Has mitral stenosis with prior mitral commissurotomy in 1970  ? ?Patient Active Problem List  ? Diagnosis Date Noted  ? Acute exacerbation of CHF (congestive heart failure) ([place]) 01/05/2022  ? Chest wall pain 11/02/2021  ? Nausea without vomiting 10/30/2021  ? Tricuspid regurgitation 07/14/2021  ? Protein-calorie malnutrition, severe 09/05/2019  ? Dehydration   ? Gastroenteritis   ? Enteritis 09/03/2019  ? COVID-19 08/27/2019  ? Dysuria 07/07/2019  ? Chronic anxiety 12/23/2017  ? Aortic stenosis, mild 07/26/2017  ? Dyslipidemia 12/20/2016  ? S/P mitral valve replacement with bioprosthetic valve + CABG x1 + maze procedure 10/14/2016  ? S/P Maze operation for atrial fibrillation 10/14/2016  ? S/P CABG x 1 10/14/2016  ? Cellulitis of left lower extremity 08/17/2016  ? Acute on chronic diastolic heart failure ([place])   ? Coronary artery disease   ? Chronic insomnia 07/12/2016  ? DOE (dyspnea on exertion) 03/26/2015  ? Warfarin-induced coagulopathy ([place]) 03/16/2015  ? Valvular heart disease 03/16/2015  ? COPD with acute exacerbation ([place]) 03/16/2015  ? Nephrolithiasis 03/16/2015  ? Acute respiratory failure with hypoxia ([place]) 03/11/2015  ? Essential hypertension   ? RHEUMATIC MITRAL STENOSIS 01/05/2008  ? Rheumatic heart disease 01/05/2008  ? Atrial fibrillation ([place]) 01/05/2008  ? ?Home Medication(s) ?Prior to Admission medications   ?Medication Sig Start Date End Date Taking? Authorizing Provider  ?acetaminophen (TYLENOL) 500 MG tablet Take 1,000 mg by mouth every 6 (six) hours as needed for mild pain.    [provider]  ?albuterol (VENTOLIN HFA) 108 (90  Base) MCG/ACT inhaler INHALE 2 PUFFS INTO THE LUNGS EVERY 4 HOURS AS NEEDED FOR AHEEZING OR SHORTNESS OF BREATH 11/25/21   Tanda Rockers, MD  ?cholecalciferol (VITAMIN D3) 25 MCG (1000 UNIT) tablet Take 1,000 Units by mouth daily.    [provider]  ?diazepam (VALIUM) 5 MG tablet TAKE ONE TABLET AT BEDTIME 11/06/21    Burchette, Alinda Sierras, MD  ?doxazosin (CARDURA) 8 MG tablet TAKE 1 TABLET DAILY ?Patient taking differently: Take 8 mg by mouth daily. 07/20/21   [name], MD  ?HYDROcodone-acetaminophen (NORCO/VICODIN) 5-325 MG tablet Take 1 tablet by mouth 3 (three) times daily as needed for moderate pain. 11/03/21   Domenic Polite, MD  ?metoprolol succinate (TOPROL-XL) 25 MG 24 hr tablet TAKE 1 TABLET DAILY 12/30/21   Burchette, Alinda Sierras, MD  ?Multiple Vitamins-Minerals (HAIR/SKIN/NAILS/BIOTIN PO) Take 1 tablet by mouth daily.    [provider]  ?Multiple Vitamins-Minerals (MULTIVITAMIN ADULT EXTRA C) CHEW Chew 2 tablets by mouth daily.    [provider]  ?mupirocin ointment (BACTROBAN) 2 % Apply 1 application. topically 2 (two) times daily. 11/30/21   Burchette, Alinda Sierras, MD  ?ondansetron (ZOFRAN) 4 MG tablet Take 1 tablet (4 mg total) by mouth every 8 (eight) hours as needed for nausea or vomiting. 10/27/21   Burchette, Alinda Sierras, MD  ?potassium chloride (KLOR-CON) 10 MEQ tablet TAKE UP TO 8 TABLETS DAILY AS DIRECTED ?Patient taking differently: Take 40 mEq by mouth 2 (two) times daily. 07/14/21   [name], MD  ?rosuvastatin (CRESTOR) 5 MG tablet Take one tablet every Monday, wednesday Friday and Sunday ?Patient taking differently: Take 5 mg by mouth See admin instructions. '5mg'$  Monday's, Wednesday's,  Friday's, and Sunday's 03/20/21   [name], MD  ?senna (SENOKOT) 8.6 MG TABS tablet Take 1 tablet (8.6 mg total) by mouth daily as needed for mild constipation. 11/03/21   Domenic Polite, MD  ?Dellis Anes 80-4.5 MCG/ACT inhaler INHALE 2 PUFFS TWICE DAILY ?Patient taking differently: 2 puffs in the morning and at bedtime. 09/29/21   Tanda Rockers, MD  ?torsemide (DEMADEX) 20 MG tablet Take 2 tablets ('40mg'$  total) twice daily. You may take an extra tablet for a total dose of 3 tablets ('60mg'$  total) twice daily as needed for weight gain or swelling. ?Patient taking differently: Take 40 mg by mouth 2 (two)  times daily. You may take an extra tablet for a total dose of 3 tablets ('60mg'$  total) twice daily as needed for weight gain or swelling. 05/22/21   Loel Dubonnet, NP  ?triamcinolone cream (KENALOG) 0.1 % Apply 1 application topically 2 (two) times daily. ?Patient taking differently: Apply 1 application. topically 2 (two) times daily as needed (rash). 07/14/21   Burchette, Alinda Sierras, MD  ?warfarin (COUMADIN) 2 MG tablet TAKE ONE TABLET DAILY BY MOUTH EXCEPT TAKE 1/2 TABLET ON WEDNESDAYS AND SATURDAYS OR AS DIRECTED BY ANTICOAGULATION CLINIC ?Patient taking differently: Take 1-2 mg by mouth See admin instructions. '1mg'$  on Wednesday's and Saturday's, '2mg'$  on all other days of the week in the evening 09/29/21   Burchette, Alinda Sierras, MD  ?                                                                                                                                  ?  Allergies ?Aldactone [spironolactone], Amoxicillin, Diltiazem, Flagyl [metronidazole hcl], Flovent [fluticasone propionate], Gabapentin, Lyrica [pregabalin], Quinidine, Simvastatin, Tramadol, Verapamil, Amlodipine, Crestor [rosuvastatin], Livalo [pitavastatin], Zetia [ezetimibe], Ace inhibitors, Benazepril hcl, Ciprocin-fluocin-procin [fluocinolone acetonide], Ciprofloxacin, Codeine, and Nitrofurantoin monohyd macro ? ?Review of Systems ?Review of Systems  ?Respiratory:  Positive for shortness of breath.   ?As noted in HPI ? ?Physical Exam ?Vital Signs  ?I have reviewed the triage vital signs ?BP (!) 129/57   Pulse 70   Temp 98.6 ?F (37 ?C) (Oral)   Resp (!) 21   Ht '5\' 3"'$  (1.6 m)   Wt 68.9 kg   SpO2 96%   BMI 26.91 kg/m?  ? ?Physical Exam ?Vitals reviewed.  ?Constitutional:   ?   General: She is not in acute distress. ?   Appearance: She is well-developed. She is not diaphoretic.  ?HENT:  ?   Head: Normocephalic and atraumatic.  ?   Nose: Nose normal.  ?Eyes:  ?   General: No scleral icterus.    ?   Right eye: No discharge.     ?   Left eye: No discharge.   ?   Conjunctiva/sclera: Conjunctivae normal.  ?   Pupils: Pupils are equal, round, and reactive to light.  ?Cardiovascular:  ?   Rate and Rhythm: Normal rate and regular rhythm.  ?   Heart sounds: No murmur heard. ?  No

## 2022-01-05 NOTE — Progress Notes (Signed)
Heart Failure Stewardship Pharmacist Progress Note ? ? ?PCP: [name], MD ?PCP-Cardiologist: [name], MD  ? ? ?HPI:  ?87 yo F with PMH of CHF, CAD s/p CABG, rheumatic heart disease, afib, history of frequent UTIs, and CKD. She presented to the ED on 4/11 with shortness of breath, LE edema, and orthopnea. CXR with possible mild interstitial edema. Her last ECHO was done 10/30/21 and LVEF was 60-65% with mildly reduced RV. ? ?Current HF Medications: ?None ? ?Prior to admission HF Medications: ?Diuretic: torsemide 40 mg BID ?Beta blocker: metoprolol XL 25 mg daily ? ?Pertinent Lab Values: ?Serum creatinine 0.97, BUN 21, Potassium 3.3, Sodium 139, BNP 184.1, Magnesium 1.9  ? ?Vital Signs: ?Weight: 150 lbs (admission weight: 150 lbs) ?Blood pressure: 120/50s  ?Heart rate: 70s  ?I/O: net -1L ? ?Medication Assistance / Insurance Benefits Check: ?Does the patient have prescription insurance?  Yes ?Type of insurance plan: [facility] Medicare ? ?Outpatient Pharmacy:  ?Prior to admission outpatient pharmacy: [facility] ?Is the patient willing to use [facility] pharmacy at discharge? Yes ?Is the patient willing to transition their outpatient pharmacy to utilize a [facility] outpatient pharmacy?   Pending ?  ? ?Assessment: ?1. Acute on chronic diastolic CHF (EF 35-45%). NYHA class III symptoms. ?- Furosemide 60 mg IV x 2 given since admission - further dosing per MD ?- Consider restarting metoprolol XL 25 mg daily ?- BP variable for ACE/ARB/ARNI or spironolactone, will await for stabilization before recommending to initiate therapy ?- Caution SGLT2i with history of UTIs ?  ?Plan: ?1) Medication changes recommended at this time: ?- Restart PTA metoprolol XL 25 mg daily ? ?2) Patient assistance: ?- None pending ? ?3)  Education  ?- To be completed prior to discharge ? ?Kerby Nora, PharmD, BCPS ?Heart Failure Stewardship Pharmacist ?Phone [phone] ? ? ?

## 2022-01-05 NOTE — Progress Notes (Signed)
Initial Nutrition Assessment ? ?DOCUMENTATION CODES:  ? ?Not applicable ? ?INTERVENTION:  ? ?- Ensure Enlive po BID, each supplement provides 350 kcal and 20 grams of protein ? ?- MVI with minerals daily ? ?- Encourage PO intake ? ?NUTRITION DIAGNOSIS:  ? ?Increased nutrient needs related to chronic illness (CHF, COPD) as evidenced by estimated needs. ? ?GOAL:  ? ?Patient will meet greater than or equal to 90% of their needs ? ?MONITOR:  ? ?PO intake, Supplement acceptance, Labs, Weight trends, I & O's ? ?REASON FOR ASSESSMENT:  ? ?Consult ?COPD Protocol ? ?ASSESSMENT:  ? ?87 year old female who presented to the ED on 4/11 with SOB. PMH of atrial fibrillation, mitral valve replacement, CHF, COPD, HTN, CAD s/p CABG, CKD stage III. ? ?Spoke with pt at bedside. Pt requesting abbreviated visit due to presence of visitor in room. Noted mostly untouched lunch meal tray at bedside. Pt had consumed [place] ice. She reports not eating the rest of her lunch due to being busy talking to her pastor, other visitors, and doctors. Pt reports that her appetite is not what it used to be. ? ?Pt reports fair to good appetite at home. She typically eats 3 "meals" daily. Breakfast is usually her largest meal. Pt will typically have oatmeal with 2 tbsp peanut butter, milk, a large orange, and half of a banana. For lunch, pt will usually have something light like a pack of Nabs crackers. For dinner, pt may have something more substantial like a meat with vegetables or may have more of a "snack" like a bag of popcorn. ? ?Pt denies any recent weight changes. Reviewed weight history in chart. Pt's weight has fluctuated between 65-71 kg over the last year with no real trends noted. Suspect weight fluctuations can be attributed to volume status given CHF diagnosis. Per nursing edema assessment, pt with deep pitting edema to BLE. ? ?Discussed importance of adequate kcal and protein intake to maintain lean muscle mass and promote healing. Pt  amenable to RD ordering Ensure oral nutrition supplements as pt used to drink something similar at home. RD will also order daily MVI with minerals. Discussed plan with RN. ? ?Medications reviewed and include: IV lasix 40 mg daily, IV solu-medrol, klor-con 40 mEq BID, warfarin, IV abx ? ?Labs reviewed: creatinine 1.05, platelets 128, INR 3.0 ? ?UOP: 600 ml x 24 hours ?I/O's: -1.0 L since admit ? ?NUTRITION - FOCUSED PHYSICAL EXAM: ? ?Deferred per pt request due to visitor presence. ? ?Diet Order:   ?Diet Order   ? ?       ?  Diet Heart Room service appropriate? Yes; Fluid consistency: Thin; Fluid restriction: 1800 mL Fluid  Diet effective now       ?  ? ?  ?  ? ?  ? ? ?EDUCATION NEEDS:  ? ?Education needs have been addressed ? ?Skin:  Skin Assessment: Reviewed RN Assessment ? ?Last BM:  01/05/22 ? ?Height:  ? ?Ht Readings from Last 1 Encounters:  ?01/05/22 '5\' 3"'$  (1.6 m)  ? ? ?Weight:  ? ?Wt Readings from Last 1 Encounters:  ?01/06/22 68.9 kg  ? ? ?BMI:  Body mass index is 26.91 kg/m?. ? ?Estimated Nutritional Needs:  ? ?Kcal:  1600-1800 ? ?Protein:  80-95 grams ? ?Fluid:  1.6-1.8 L ? ? ? ?Gustavus Bryant, MS, RD, LDN ?Inpatient Clinical Dietitian ?Please see AMiON for contact information. ? ?

## 2022-01-05 NOTE — ED Notes (Signed)
Humidification bottle added to patient's nasal cannula per pt request. ?

## 2022-01-05 NOTE — Consult Note (Addendum)
?Cardiology Consultation:  ? ?Patient ID: Catherine [name] ?MRN: [id number]; DOB: [birth year] ? ?Admit date: 01/05/2022 ?Date of Consult: 01/05/2022 ? ?PCP:  [name], MD ?  ?[facility] ?Cardiologist:  [name], MD    ? ?Patient Profile:  ? ?Catherine [name] is a 87 y.o. 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 Tamala Julian. ? ?History of Present Illness:  ? ?Catherine [name] is an 87 yo female with PMH noted above. She has been followed by Dr. Oval Linsey as an outpatient, previously [name].  She underwent mitral commissurotomy in the 1970s. Echocardiogram 02/2015 showed an EF of 60 to 65% with moderate to severe mitral regurg, moderate tricuspid regurg and elevated pulmonary artery pressures.  On echo in September 2017 she was noted to have moderate pulmonary stenosis with moderate to severe MR.  She was referred for left and right heart cath.  She is found to have severe mitral stenosis with a mean gradient of 13 mmHg, also 50% lesion in left circumflex and 80% RCA lesion.  She had a single-vessel CABG with maze and bioprosthetic mitral valve with Dr. Roxy Manns 09/2016.  Postop she continued to report fatigue.  It was unclear whether she was in atrial fibrillation or sinus rhythm with a prolonged PR interval.  She was referred to the atrial fibrillation clinic.  It was felt that if she were in atrial fibrillation it would not be possible to maintain sinus rhythm.  She developed worsening lower extremity edema.  Stopped her amlodipine which helped.  She was switched from Lasix to torsemide which helped with abdominal bloating but still had persistent lower extremity edema. ? ?She was admitted to the hospital 10/29/2021 and found to be hypoxic with BNP of 234 and chest CT that showed right middle lobe  atelectasis and small pleural effusions.  Echocardiogram showed preserved EF, moderate TR and stable bioprosthetic mitral valve.  It was suspected that she was experiencing a COPD exacerbation with mild component of diastolic heart failure. ? ?Seen in the office on 11/19/2021 with Dr. Oval Linsey.  Stated she was having some difficulty with productive cough.  She was instructed to increase her torsemide to 60 mg for 2 days then resume prior home dose.  It was recommended that she follow-up with her PCP/pulmonary for her dyspnea as it was felt likely not related to heart failure. ? ?Presented to the ED on 4/11 with complaints of progressive shortness of breath, LE edema and productive cough. No chest pain. Weight gain of 5lbs over the past week. States she has not felt well since being discharged back in February. Seen by her pulmonologist, no changed in meds.  ? ?Labs in the ED showed Na 139, K 3.3, Cr 0.97, Mag 1.9, BNP 184, hsTn 10>>10, procalcitonin 0.11, WBC 6.7, Hgb 12.6, INR 3.0, TSH 1.7. EKG appears afib rate controlled 67bpm. She was treated with IV lasix '60mg'$  x2, rocephin and admitted to [facility] for further management.  ? ?In review of telemetry, noted to have episodes RVR rates in the 100-120s, then return to baseline of 70-80s. She does report feeling the palpitations.  ? ? ?Past Medical History:  ?Diagnosis Date  ? Acute on chronic diastolic heart failure ([place])   ? Aortic stenosis, mild 07/26/2017  ? Arthritis   ? Asthma   ?  last attack 02/2015  ? Atrial fibrillation, chronic ([place])   ? Cellulitis of left lower extremity 08/17/2016  ? Ulcer associated with severe venous insufficiency  ? Chronic anticoagulation   ? Chronic diastolic CHF (congestive heart failure) ([place])   ? Chronic kidney disease   ? "RIGHT MANY KIDNEY INFECTIONS AND STONES"  ? COPD (chronic obstructive pulmonary disease) ([place])   ? Coronary artery disease   ? Dizziness   ? H/O: rheumatic fever   ? Heart murmur   ? Hypertension   ? PONV  (postoperative nausea and vomiting)   ? ' SOMETIMES', BUT NOT ALWAYS"  ? S/P Maze operation for atrial fibrillation 10/14/2016  ? Complete bilateral atrial lesion set using cryothermy and bipolar radiofrequency ablation - atrial appendage was not treated due to previous surgical procedure (open mitral commissurotomy)  ? S/P mitral valve replacement with bioprosthetic valve 10/14/2016  ? 29 mm Medtronic Mosaic porcine bioprosthetic tissue valve  ? Tricuspid regurgitation 07/14/2021  ? UTI (urinary tract infection)   ? Valvular heart disease   ? Has mitral stenosis with prior mitral commissurotomy in 1970  ? ? ?Past Surgical History:  ?Procedure Laterality Date  ? ABDOMINAL HYSTERECTOMY  1983  ? endometriosis  ? APPENDECTOMY    ? BACK SURGERY    ? neurosurgery x2  ? CARDIAC CATHETERIZATION    ? CARDIAC CATHETERIZATION N/A 08/03/2016  ? Procedure: Right/Left Heart Cath and Coronary Angiography;  Surgeon: Peter M Martinique, MD;  Location: [facility];  Service: Cardiovascular;  Laterality: N/A;  ? cataract surg    ? CHOLECYSTECTOMY N/A 05/30/2015  ? Procedure: LAPAROSCOPIC CHOLECYSTECTOMY WITH INTRAOPERATIVE CHOLANGIOGRAM;  Surgeon: Excell Seltzer, MD;  Location: [facility];  Service: General;  Laterality: N/A;  ? COLONOSCOPY    ? CORONARY ARTERY BYPASS GRAFT N/A 10/14/2016  ? Procedure: CORONARY ARTERY BYPASS GRAFTING (CABG);  Surgeon: Rexene Alberts, MD;  Location: [facility];  Service: Open Heart Surgery;  Laterality: N/A;  ? EYE SURGERY    ? MAZE N/A 10/14/2016  ? Procedure: MAZE;  Surgeon: Rexene Alberts, MD;  Location: [facility];  Service: Open Heart Surgery;  Laterality: N/A;  ? MITRAL VALVE REPLACEMENT N/A 10/14/2016  ? Procedure: REDO MITRAL VALVE REPLACEMENT (MVR);  Surgeon: Rexene Alberts, MD;  Location: [facility];  Service: Open Heart Surgery;  Laterality: N/A;  ? MITRAL VALVE SURGERY Left 1970  ? Open mitral commissurotomy via left thoracotomy approach  ? TEE WITHOUT CARDIOVERSION N/A 07/05/2016  ? Procedure:  TRANSESOPHAGEAL ECHOCARDIOGRAM (TEE);  Surgeon: [name], MD;  Location: [facility];  Service: Cardiovascular;  Laterality: N/A;  ? TEE WITHOUT CARDIOVERSION N/A 10/14/2016  ? Procedure: TRANSESOPHAGEAL ECHOCARDIOGRAM (TEE);  Surgeon: Rexene Alberts, MD;  Location: [facility];  Service: Open Heart Surgery;  Laterality: N/A;  ?  ? ?Home Medications:  ?Prior to Admission medications   ?Medication Sig Start Date End Date Taking? Authorizing Provider  ?acetaminophen (TYLENOL) 500 MG tablet Take 1,000 mg by mouth every 6 (six) hours as needed for mild pain.   Yes [provider]  ?albuterol (VENTOLIN HFA) 108 (90 Base) MCG/ACT inhaler INHALE 2 PUFFS INTO THE LUNGS EVERY 4 HOURS AS NEEDED FOR AHEEZING OR SHORTNESS OF BREATH 11/25/21  Yes Tanda Rockers, MD  ?cholecalciferol (VITAMIN D3) 25 MCG (1000 UNIT) tablet Take 1,000 Units by mouth daily.   Yes [provider]  ?diazepam (VALIUM) 5 MG tablet TAKE ONE TABLET AT BEDTIME ?Patient taking differently: Take 5 mg by mouth at  bedtime. 11/06/21  Yes Burchette, Alinda Sierras, MD  ?doxazosin (CARDURA) 8 MG tablet TAKE 1 TABLET DAILY ?Patient taking differently: Take 8 mg by mouth daily with supper. 07/20/21  Yes [name], MD  ?guaiFENesin (MUCINEX) 600 MG 12 hr tablet Take 600 mg by mouth 3 (three) times daily as needed for to loosen phlegm.   Yes [provider]  ?metoprolol succinate (TOPROL-XL) 25 MG 24 hr tablet TAKE 1 TABLET DAILY ?Patient taking differently: 25 mg daily. 12/30/21  Yes Burchette, Alinda Sierras, MD  ?Multiple Vitamins-Minerals (HAIR/SKIN/NAILS/BIOTIN PO) Take 1 tablet by mouth daily.   Yes [provider]  ?Multiple Vitamins-Minerals (MULTIVITAMIN ADULT EXTRA C) CHEW Chew 2 tablets by mouth daily.   Yes [provider]  ?mupirocin ointment (BACTROBAN) 2 % Apply 1 application. topically 2 (two) times daily. ?Patient taking differently: Apply 1 application. topically at bedtime. 11/30/21  Yes Burchette, Alinda Sierras, MD   ?ondansetron (ZOFRAN) 4 MG tablet Take 1 tablet (4 mg total) by mouth every 8 (eight) hours as needed for nausea or vomiting. 10/27/21  Yes Burchette, Alinda Sierras, MD  ?potassium chloride (KLOR-CON) 10 MEQ tablet TAKE UP TO 8 TABLETS D

## 2022-01-05 NOTE — Assessment & Plan Note (Deleted)
Patient presents with complaints of 4-day history of shortness of breath with reports of increased cough, leg swelling, and weight gain of 5 pounds.  Patient was noted to be hypoxic down to 88-87% and placed on 3 L nasal cannula oxygen with improvement of O2 saturation greater than 92%.  Chest x-ray noted cardiomegaly with small left-sided effusion and mild diffuse interstitial opacity concerning for interstitial edema or inflammatory process.  BNP was mildly elevated at 184.1.  Last echocardiogram 2022 revealed EF of 60 to 65% with indeterminant diastolic parameters with normal looking bioprosthetic MVR. patient had been given Lasix 60 mg IV in the emergency department.  Patient only has trace lower extremity edema at this time with no significant JVD appreciated on physical exam. ?-Admit to a cardiac telemetry bed ?-Strict I&O's and daily weight ?-Continue nasal cannula oxygen maintain O2 saturation greater than 92% ?-Check procalcitonin ?-Check O2 saturations with ambulation ?-Heart healthy diet with fluid restriction ?-Give Lasix 60 mg IV x1 dose ?-Empiric antibiotics with Rocephin ?-Prednisone 40 mg daily ?-Pharmacy substitution of Brovana and budesonide nebs ?-DuoNebs 4 times daily ?-PT to eval and treat tomorrow morning ?

## 2022-01-05 NOTE — Progress Notes (Addendum)
Heart Failure Nurse Navigator Progress Note ? ?Attempted to interview patient to assess for HV TOC readiness. Pt currently sleeping on R side. SpO2 96% on 3LPM, noted increased WOB, with prolonged exhale. Noted junctional to AF on monitor. Spoke with bedside RN, Hildred Alamin, states she had fine crackles. IV lasix ordered on MAR for noon.  ? ?Will visit patient again afternoon to attempt interview and assess UOP.  ? ?Pricilla Holm, MSN, RN ?Heart Failure Nurse Navigator ? ?ADDENDUM 1300:  ? ?Heart Failure Nurse Navigator Progress Note ? ?PCP: [name], MD ?PCP-Cardiologist: Berneice Gandy., MD ?Admission Diagnosis: acute CHF ?Admitted from: home with spouse ? ?Presentation:   ?[place] presented 4/11 with increased SOB and BLE edema. Pt is s/p MAZE + MVR 2018. Recently hospitalized in February for PNA. Patient interactive with interview process. Daughter at bedside, pitting sitting on EOB with 2 LPM O2, SpO2 92%. Pt has wet productive cough. BLE +1 pitting edema at ankles, warm and dry. Pt voices concern regarding need for O2 right now. Explained progression with probable PT and ambulatory oxygenation need evaluation. No issues with medication compliance or cost. Quit smoking in 1976, hx of 2PPD x 25 years.  ?Pt had 5 second run of increased HR 130-140s, when asked she stated she could feel the fast heart rate, states this is normal for her.  ?Noted 212m in cannister from [place] dose of IV lasix at 11AM. Pt states she feels she might be wet on her pad currently. Bedside staff notified.  ?Pt does not eat high salt diet, moderate fluid intake at home. Pt states she still drives, [place] Explained benefits of Heart & Vascular Transitions of Care Clinic appointment, patient agreeable.  ? ?Pt has frequent UTI.  ? ?ECHO/ LVEF: 60-65%, Moderately reduced RV, mod TR, mild AS. S/P valve repair and MAZE.  ? ?Clinical Course: ? ?Past Medical History:  ?Diagnosis Date  ? Acute on chronic diastolic  heart failure ([place]   ? Aortic stenosis, mild 07/26/2017  ? Arthritis   ? Asthma   ? last attack 02/2015  ? [place]rial [place], chronic [place]   ? Cellulitis of left lower extremity 08/17/2016  ? Ulcer associated with severe venous insufficiency  ? Chronic anticoagulation   ? Chronic diastolic CHF (congestive heart failure) ([place]   ? Chronic kidney disease   ? "RIGHT MANY KIDNEY INFECTIONS AND STONES"  ? COPD [place] obstructive pulmonary disease) [place]   ? Coronary artery disease   ? Dizziness   ? H/O: rheumatic fever   ? Heart murmur   ? Hypertension   ? PONV (postoperative nausea and vomiting)   ? ' SOMETIMES', BUT NOT ALWAYS"  ? S/P Maze operation for atrial fibrillation 10/14/2016  ? Complete bilateral atrial lesion set using cryothermy and bipolar radiofrequency ablation - atrial appendage was not treated due to previous surgical procedure (open mitral commissurotomy)  ? S/P mitral valve replacement with bioprosthetic valve 10/14/2016  ? 29 mm Medtronic Mosaic porcine bioprosthetic tissue valve  ? Tricuspid regurgitation 07/14/2021  ? UTI (urinary tract infection)   ? Valvular heart disease   ? Has mitral stenosis with prior mitral commissurotomy in 1970  ?  ? ?Social History  ? ?Socioeconomic History  ? Marital status: Married  ?  Spouse name: Not on file  ? Number of children: Not on file  ? Years of education: Not on file  ? Highest education level: Not on file  ?Occupational History  ? Not on file  ?  Tobacco Use  ? Smoking status: Former  ?  Packs/day: 1.00  ?  Years: 15.00  ?  Pack years: 15.00  ?  Types: Cigarettes  ?  Quit date: 09/28/1975  ?  Years since quitting: 46.3  ? Smokeless tobacco: Never  ?Vaping Use  ? Vaping Use: Never used  ?Substance and Sexual Activity  ? Alcohol use: No  ?  Alcohol/week: 0.0 standard drinks  ? Drug use: No  ? Sexual activity: Yes  ?Other Topics Concern  ? Not on file  ?Social History Narrative  ? Not on file  ? ?Social Determinants of Health  ? ?Financial Resource Strain: Low  Risk   ? Difficulty of Paying Living Expenses: Not hard at all  ?Food Insecurity: No Food Insecurity  ? Worried About Charity fundraiser in the Last Year: Never true  ? Ran Out of Food in the Last Year: Never true  ?Transportation Needs: No Transportation Needs  ? Lack of Transportation (Medical): No  ? Lack of Transportation (Non-Medical): No  ?Physical Activity: Inactive  ? Days of Exercise per Week: 0 days  ? Minutes of Exercise per Session: 0 min  ?Stress: No Stress Concern Present  ? Feeling of Stress : Not at all  ?Social Connections: Moderately Integrated  ? Frequency of Communication with Friends and Family: Twice a week  ? Frequency of Social Gatherings with Friends and Family: Twice a week  ? Attends Religious Services: More than 4 times per year  ? Active Member of Clubs or Organizations: No  ? Attends Archivist Meetings: Never  ? Marital Status: Married  ? ? ?High Risk Criteria for Readmission and/or Poor Patient Outcomes: ?Heart failure hospital admissions (last 6 months): 1  ?No Show rate: 1% ?Difficult social situation: no ?Demonstrates medication adherence: yes ?Primary Language: English ?Literacy level: able to read/write and comprehend. ? ?Barriers of Care:   ?-none ? ?Considerations/Referrals:  ? ?Referral made to Heart Failure Pharmacist Stewardship: yes, appreciated ?Referral made to Heart Failure CSW/NCM TOC: no ?Referral made to Heart & Vascular TOC clinic: pending hospitalization  ? ?Items for Follow-up on DC/TOC: ?-optimize ?-continue HF education ? ? ?Pricilla Holm, MSN, RN ?Heart Failure Nurse Navigator ? ? ? ?

## 2022-01-05 NOTE — Progress Notes (Signed)
ANTICOAGULATION CONSULT NOTE - Initial Consult ? ?Pharmacy Consult for Warfarin ?Indication: atrial fibrillation ? ?Allergies  ?Allergen Reactions  ? Aldactone [Spironolactone] Other (See Comments)  ?  dyspnea  ? Amoxicillin Palpitations  ?  Tachycardia ?Has patient had a PCN reaction causing immediate rash, facial/tongue/throat swelling, SOB or lightheadedness with hypotension: no ?Has patient had a PCN reaction causing severe rash involving mucus membranes or skin necrosis: {no ?Has patient had a PCN reaction that required hospitalization {no ?Has patient had a PCN reaction occurring within the last 10 years: {yes ?If all of the above answers are "NO", then may proceed with Cephalosporin use.  ? Diltiazem Other (See Comments)  ?  Causing headaches ?  ? Flagyl [Metronidazole Hcl] Other (See Comments)  ?  Causing headaches ? ?  ? Flovent [Fluticasone Propionate] Other (See Comments)  ?  Leg cramps  ? Gabapentin Swelling  ? Lyrica [Pregabalin] Swelling  ? Quinidine Diarrhea and Other (See Comments)  ?  Fever diarrhea  ? Simvastatin Other (See Comments)  ?  Leg pain, myalgia  ? Tramadol Nausea Only  ? Verapamil Other (See Comments)  ?  myalgias  ? Amlodipine   ?  Low extremity edema  ? Crestor [Rosuvastatin]   ?  myalgia  ? Livalo [Pitavastatin]   ?  myalgias  ? Zetia [Ezetimibe]   ?  LEG CRAMPS ?  ? Ace Inhibitors Other (See Comments)  ?  unknown  ? Benazepril Hcl Cough  ? Ciprocin-Fluocin-Procin [Fluocinolone Acetonide] Other (See Comments)  ?  unknown  ? Ciprofloxacin Diarrhea  ? Codeine Nausea Only  ? Nitrofurantoin Monohyd Macro Nausea Only  ? ? ?Patient Measurements: ?Height: '5\' 3"'$  (160 cm) ?Weight: 68.4 kg (150 lb 12.7 oz) ?IBW/kg (Calculated) : 52.4 ? ? ?Vital Signs: ?Temp: 99 ?F (37.2 ?C) (04/11 7510) ?Temp Source: Oral (04/11 2585) ?BP: 116/76 (04/11 0854) ?Pulse Rate: 67 (04/11 0800) ? ?Labs: ?Recent Labs  ?  01/05/22 ?0116 01/05/22 ?2778  ?HGB 12.6  --   ?HCT 38.5  --   ?PLT 128*  --   ?LABPROT 30.6*  --    ?INR 3.0*  --   ?CREATININE 0.97  --   ?TROPONINIHS 10 10  ? ? ?Estimated Creatinine Clearance: 40.1 mL/min (by C-G formula based on SCr of 0.97 mg/dL). ? ? ?Medical History: ?Past Medical History:  ?Diagnosis Date  ? Acute on chronic diastolic heart failure ([place])   ? Aortic stenosis, mild 07/26/2017  ? Arthritis   ? Asthma   ? last attack 02/2015  ? Atrial fibrillation, chronic ([place])   ? Cellulitis of left lower extremity 08/17/2016  ? Ulcer associated with severe venous insufficiency  ? Chronic anticoagulation   ? Chronic diastolic CHF (congestive heart failure) ([place])   ? Chronic kidney disease   ? "RIGHT MANY KIDNEY INFECTIONS AND STONES"  ? COPD (chronic obstructive pulmonary disease) ([place])   ? Coronary artery disease   ? Dizziness   ? H/O: rheumatic fever   ? Heart murmur   ? Hypertension   ? PONV (postoperative nausea and vomiting)   ? ' SOMETIMES', BUT NOT ALWAYS"  ? S/P Maze operation for atrial fibrillation 10/14/2016  ? Complete bilateral atrial lesion set using cryothermy and bipolar radiofrequency ablation - atrial appendage was not treated due to previous surgical procedure (open mitral commissurotomy)  ? S/P mitral valve replacement with bioprosthetic valve 10/14/2016  ? 29 mm Medtronic Mosaic porcine bioprosthetic tissue valve  ? Tricuspid regurgitation 07/14/2021  ? UTI (urinary  tract infection)   ? Valvular heart disease   ? Has mitral stenosis with prior mitral commissurotomy in 1970  ? ? ? ?Assessment: ?87 yo female with worsening SOB and lower extremity edema. Patient on warfarin chronically for atrial fibrillation.  Pharmacy consulted to continue warfarin therapy ? ?Home warfarin dose 2 mg daily except 1 mg on Wed and Sat. ? ?INR today - 3 ? ?Goal of Therapy:  ?INR 2-3 ? ?  ?Plan:  ?Warfarin 1 mg po x 1 ?Monitor daily INR and CBC ?Monitor for signs and symptoms of bleeding ? ?Alanda Slim, PharmD, FCCM ?Clinical Pharmacist ?Please see AMION for all Pharmacists' Contact Phone Numbers ?01/05/2022,  10:32 AM  ? ? ? ?

## 2022-01-05 NOTE — Assessment & Plan Note (Addendum)
Continue statin the ?

## 2022-01-05 NOTE — Assessment & Plan Note (Addendum)
Continue heart rate control with metoprolol and anticoagulation with warfarin.  ?INR is 2.3  ?

## 2022-01-05 NOTE — ED Triage Notes (Signed)
Pt arrives POV with her husband. ? ?Pt reports shortness of breath since Saturday that has gotten worse. ? ?Pt says she was recently hospitalized for pneumonia. ? ?She has dry cough and reports a low grade fever over the weekend.   ? ?Pt appears uncomfortable in triage, BIB wheelchair. ?

## 2022-01-05 NOTE — Plan of Care (Signed)
[facility] will assume care on arrival to accepting facility. Until arrival, care as per EDP. However, [facility] available 24/7 for questions and assistance.  Nursing staff, please page TRH Admits and Consults ([phone]) as soon as the patient arrives to the hospital.   

## 2022-01-05 NOTE — Assessment & Plan Note (Signed)
Due to the history of rheumatic heart disease. ?

## 2022-01-05 NOTE — Telephone Encounter (Signed)
Patient's husband wants Dr. Elease Hashimoto to know that patient is back in the hospital.  She will need to r/s labs once she knows what the hospital did for labs. ?

## 2022-01-06 DIAGNOSIS — J9601 Acute respiratory failure with hypoxia: Secondary | ICD-10-CM | POA: Diagnosis not present

## 2022-01-06 LAB — CBC
HCT: 35.6 % — ABNORMAL LOW (ref 36.0–46.0)
Hemoglobin: 11.4 g/dL — ABNORMAL LOW (ref 12.0–15.0)
MCH: 30.2 pg (ref 26.0–34.0)
MCHC: 32 g/dL (ref 30.0–36.0)
MCV: 94.4 fL (ref 80.0–100.0)
Platelets: 130 10*3/uL — ABNORMAL LOW (ref 150–400)
RBC: 3.77 MIL/uL — ABNORMAL LOW (ref 3.87–5.11)
RDW: 13.3 % (ref 11.5–15.5)
WBC: 8.4 10*3/uL (ref 4.0–10.5)
nRBC: 0 % (ref 0.0–0.2)

## 2022-01-06 LAB — BASIC METABOLIC PANEL
Anion gap: 6 (ref 5–15)
BUN: 13 mg/dL (ref 8–23)
CO2: 28 mmol/L (ref 22–32)
Calcium: 8.9 mg/dL (ref 8.9–10.3)
Chloride: 106 mmol/L (ref 98–111)
Creatinine, Ser: 1.05 mg/dL — ABNORMAL HIGH (ref 0.44–1.00)
GFR, Estimated: 52 mL/min — ABNORMAL LOW (ref >60)
Glucose, Bld: 158 mg/dL — ABNORMAL HIGH (ref 70–99)
Potassium: 4.1 mmol/L (ref 3.5–5.1)
Sodium: 140 mmol/L (ref 135–145)

## 2022-01-06 LAB — PROTIME-INR
INR: 3.1 — ABNORMAL HIGH (ref 0.8–1.2)
Prothrombin Time: 31.6 seconds — ABNORMAL HIGH (ref 11.4–15.2)

## 2022-01-06 MED ORDER — METHYLPREDNISOLONE SODIUM SUCC 125 MG IJ SOLR
80.0000 mg | Freq: Every day | INTRAMUSCULAR | Status: DC
  Administered 2022-01-06 – 2022-01-08 (×3): 80 mg via INTRAVENOUS
  Filled 2022-01-06 (×3): qty 2

## 2022-01-06 MED ORDER — FUROSEMIDE 10 MG/ML IJ SOLN
40.0000 mg | Freq: Every day | INTRAMUSCULAR | Status: DC
  Administered 2022-01-06 – 2022-01-09 (×4): 40 mg via INTRAVENOUS
  Filled 2022-01-06 (×4): qty 4

## 2022-01-06 MED ORDER — HYDROXYZINE HCL 10 MG PO TABS
10.0000 mg | ORAL_TABLET | Freq: Three times a day (TID) | ORAL | Status: DC | PRN
  Administered 2022-01-06 – 2022-01-08 (×2): 10 mg via ORAL
  Filled 2022-01-06 (×2): qty 1

## 2022-01-06 MED ORDER — DOXYCYCLINE HYCLATE 100 MG PO TABS
100.0000 mg | ORAL_TABLET | Freq: Two times a day (BID) | ORAL | Status: DC
  Administered 2022-01-06 – 2022-01-08 (×4): 100 mg via ORAL
  Filled 2022-01-06 (×4): qty 1

## 2022-01-06 MED ORDER — SODIUM CHLORIDE 0.9 % IV SOLN
100.0000 mg | Freq: Two times a day (BID) | INTRAVENOUS | Status: DC
  Administered 2022-01-06: 100 mg via INTRAVENOUS
  Filled 2022-01-06: qty 100

## 2022-01-06 MED ORDER — ADULT MULTIVITAMIN W/MINERALS CH
1.0000 | ORAL_TABLET | Freq: Every day | ORAL | Status: DC
  Administered 2022-01-07 – 2022-01-12 (×6): 1 via ORAL
  Filled 2022-01-06 (×6): qty 1

## 2022-01-06 MED ORDER — MOMETASONE FURO-FORMOTEROL FUM 100-5 MCG/ACT IN AERO
2.0000 | INHALATION_SPRAY | Freq: Two times a day (BID) | RESPIRATORY_TRACT | Status: DC
Start: 2022-01-06 — End: 2022-01-12
  Administered 2022-01-06 – 2022-01-12 (×12): 2 via RESPIRATORY_TRACT
  Filled 2022-01-06: qty 8.8

## 2022-01-06 MED ORDER — ENSURE ENLIVE PO LIQD
237.0000 mL | Freq: Two times a day (BID) | ORAL | Status: DC
  Administered 2022-01-06 – 2022-01-12 (×7): 237 mL via ORAL

## 2022-01-06 MED ORDER — METOPROLOL SUCCINATE ER 25 MG PO TB24
37.5000 mg | ORAL_TABLET | Freq: Every day | ORAL | Status: DC
  Administered 2022-01-07 – 2022-01-10 (×4): 37.5 mg via ORAL
  Filled 2022-01-06 (×4): qty 2

## 2022-01-06 NOTE — Progress Notes (Signed)
Pt had vivid night terrors of someone shooting in the hospital. Endorses she hasn't been watching news lately, but is very shaken up. She is concerned that " I'm supposed to be getting better, but I feel like this has set me back". Also endorses that she feels like she's working harder to breathe. Expiratory wheezes noted upon ascultation. All other vitals WDL, O2 on 3L [place] is 94-95% which I figure is the best she's going to get with her COPD.  ? ?Billy Coast MD secure chatted '@0800'$  for non -urgent order requests:baby dose anti-anxiety med and knee high TED hose. Pt has 2+ BLE edema.  ? ?

## 2022-01-06 NOTE — Progress Notes (Addendum)
? ?Progress Note ? ?Patient Name: Catherine [name] ?Date of Encounter: 01/06/2022 ? ?[facility] Cardiologist: [name], MD  ? ?Subjective  ? ?Had a rough night, vivid nightmares. Breathing was short as well.  ? ?Inpatient Medications  ?  ?Scheduled Meds: ? arformoterol  15 mcg Nebulization BID  ? budesonide (PULMICORT) nebulizer solution  0.5 mg Nebulization BID  ? doxycycline  100 mg Oral Q12H  ? furosemide  40 mg Intravenous Daily  ? guaiFENesin  600 mg Oral BID  ? ipratropium-albuterol  3 mL Nebulization TID  ? mouth rinse  15 mL Mouth Rinse BID  ? methylPREDNISolone (SOLU-MEDROL) injection  80 mg Intravenous Daily  ? metoprolol succinate  25 mg Oral Daily  ? mometasone-formoterol  2 puff Inhalation BID  ? potassium chloride  40 mEq Oral BID  ? [START ON 01/07/2022] rosuvastatin  5 mg Oral Once per day on Sun Tue Thu Sat  ? sodium chloride flush  3 mL Intravenous Q12H  ? Warfarin - Pharmacist Dosing Inpatient   Does not apply M3536  ? ?Continuous Infusions: ? sodium chloride    ? cefTRIAXone (ROCEPHIN)  IV Stopped (01/05/22 1711)  ? ?PRN Meds: ?sodium chloride, acetaminophen, albuterol, diazepam, guaiFENesin-dextromethorphan, hydrOXYzine, ondansetron (ZOFRAN) IV, sodium chloride flush  ? ?Vital Signs  ?  ?Vitals:  ? 01/06/22 0400 01/06/22 0801 01/06/22 0808 01/06/22 1127  ?BP: (!) 99/52 127/64  (!) 144/63  ?Pulse: 70 87 (!) 102 70  ?Resp: '18 14 20 20  '$ ?Temp: 98.1 ?F (36.7 ?C) 97.7 ?F (36.5 ?C)  98.5 ?F (36.9 ?C)  ?TempSrc: Oral Axillary  Oral  ?SpO2: 96% 97% 97% 96%  ?Weight: 68.9 kg     ?Height:      ? ? ?Intake/Output Summary (Last 24 hours) at 01/06/2022 1137 ?Last data filed at 01/06/2022 1443 ?Gross per 24 hour  ?Intake 223 ml  ?Output 600 ml  ?Net -377 ml  ? ? ?  01/06/2022  ?  4:00 AM 01/05/2022  ?  8:50 AM 01/05/2022  ? 12:54 AM  ?Last 3 Weights  ?Weight (lbs) 151 lb 14.4 oz 150 lb 12.7 oz 151 lb 14.4 oz  ?Weight (kg) 68.9 kg 68.4 kg 68.9 kg  ?   ? ?Telemetry  ?  ?Junctional rhythm vs aFib 70s,  episodes of narrow complex tachycardia - Personally Reviewed ? ?ECG  ?  ?No new tracing this morning ? ?Physical Exam  ? ?GEN: No acute distress. Wearing    ?Neck: No JVD ?Cardiac: RRR, no murmurs, rubs, or gallops.  ?Respiratory: Expiratory wheezing ?GI: Soft, nontender, non-distended  ?MS: mild LE edema; No deformity. ?Neuro:  Nonfocal  ?Psych: Normal affect  ? ?Labs  ?  ?High Sensitivity Troponin:   ?Recent Labs  ?Lab 01/05/22 ?0116 01/05/22 ?0313  ?TROPONINIHS 10 10  ?   ?Chemistry ?Recent Labs  ?Lab 01/05/22 ?0116 01/05/22 ?1041 01/06/22 ?1540  ?NA 139  --  140  ?K 3.3*  --  4.1  ?CL 103  --  106  ?CO2 24  --  28  ?GLUCOSE 128*  --  158*  ?BUN 21  --  13  ?CREATININE 0.97  --  1.05*  ?CALCIUM 9.4  --  8.9  ?MG  --  1.9  --   ?PROT 7.1  --   --   ?ALBUMIN 4.1  --   --   ?AST 19  --   --   ?ALT 21  --   --   ?ALKPHOS 87  --   --   ?  BILITOT 0.2*  --   --   ?GFRNONAA 58*  --  52*  ?ANIONGAP 12  --  6  ?  ?Lipids No results for input(s): CHOL, TRIG, HDL, LABVLDL, LDLCALC, CHOLHDL in the last 168 hours.  ?Hematology ?Recent Labs  ?Lab 01/05/22 ?0116 01/06/22 ?9450  ?WBC 6.7 8.4  ?RBC 4.12 3.77*  ?HGB 12.6 11.4*  ?HCT 38.5 35.6*  ?MCV 93.4 94.4  ?MCH 30.6 30.2  ?MCHC 32.7 32.0  ?RDW 13.2 13.3  ?PLT 128* 130*  ? ?Thyroid  ?Recent Labs  ?Lab 01/05/22 ?1041  ?TSH 1.716  ?  ?BNP ?Recent Labs  ?Lab 01/05/22 ?0116  ?BNP 184.1*  ?  ?DDimer No results for input(s): DDIMER in the last 168 hours.  ? ?Radiology  ?  ?DG Chest Port 1 View ? ?Result Date: 01/05/2022 ?CLINICAL DATA:  Shortness of breath EXAM: PORTABLE CHEST 1 VIEW COMPARISON:  11/02/2021, CT 10/29/2021, radiograph 07/09/2020 FINDINGS: Post sternotomy changes. Valve prosthesis. Cardiomegaly with small left-sided pleural effusion. Diffuse increased interstitial opacity. No pneumothorax IMPRESSION: 1. Cardiomegaly with small left effusion. Mild diffuse interstitial opacity could be due to mild interstitial edema or interstitial inflammatory process Electronically  Signed   By: Donavan Foil M.D.   On: 01/05/2022 01:32   ? ?Cardiac Studies  ? ?N/a  ? ?Patient Profile  ?   ?87 y.o. 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 Tamala Julian. ? ?Assessment & Plan  ?  ?Acute hypoxic respiratory in the setting of COPD/HFpEF: reports she has not felt well since being discharged back in February. Suspect etiology is multifactorial, though likely that her CHF is contributing. BNP elevated and CXR with  small left pleural effusion.  ?-- given IV lasix '60mg'$  x2 on admission, net -1L. Ordered for additional lasix ?-- continue home nebs and steroids  ?  ?Paroxysmal afib/junctional rhythm: she was documented to be in SR at last office visit. Now appears in afib with episode of RVR on telemetry. Suspect this may be contributing to her HF. Has been on Toprol '25mg'$  daily PTA. May need to consider addition AAD? ?-- on Toprol '25mg'$  daily, will further increase to 37.'5mg'$  daily ?-- continue coumadin per pharmacy, INR 3.0 on admission  ?  ?S/p bioprosthetic MVR/CABG x1/maze: no chest pain PTA, stable MV on echo 10/2021 ?-- hsTn neg x2 ?  ?HLD: on statin ?  ?Per primary: ?COPD ?Hypokalemia ?Anxiety ? ?For questions or updates, please contact [place] ?Please consult [email or web address] for contact info under  ? ?  ?   ?Signed, ?Reino Bellis, NP  ?01/06/2022, 11:37 AM   ? ? ? ?Patient seen and examined. Agree with assessment and plan. Rhythm currently stable, junctional in the 70s without significant tachycardic episodes. I/O -3888 since admission. Breathing better. ? ? ?Troy Sine, MD, FACC ?01/06/2022 ?7:14 PM  ? ?

## 2022-01-06 NOTE — Hospital Course (Addendum)
Catherine [name] was admitted to the hospital with the working diagnosis of decompensated heart failure and COPD exacerbation with acute hypoxemic respiratory failure.  ? ?87 year old with history of chronic diastolic CHF on torsemide, A-fib on Coumadin, COPD, CAD, s/p bioprosthetic MVR/CABG x1/maze, hyperlipidemia presenting with progressively worsening dyspnea on exertion and peripheral edema.  5 lbs weight gain. On her initial physical examination her blood pressure was 122/46, HR 67 RR 23 an 02 saturation 92%, lungs with decreased ventilation, expiratory wheezing, heart with S1 and S2 present and rhythmic, no gallops or murmurs, trace lower extremity edema.  ? ?Na 139, K 3,3 Cl 103, bicarbonate 24 glucose 128 bun 21 cr 0,97  ?BNP 184  ?High sensitive troponin 10 and 10  ?Wbc 6.7 hgb 12,6 plt 128  ?INR 3.0  ? ?Chest radiograph with cardiomegaly, bilateral increased interstitial markings.  ? ?EKG 67 bpm, normal axis, normal qtc, junctional rhythm, q waves in V1 and V2, no significant ST segment or T wave changes.  ? ?She was given IV Lasix 60 mg.  O2 saturations as low as 88 to 87% placed on 3 L oxygen for comfort. ?Cardiology was consulted for acute hypoxic aspiratory failure in the setting of COPD and acute on chronic diastolic CHF, also with paroxysmal A-fib/junctional rhythm. ?

## 2022-01-06 NOTE — Progress Notes (Signed)
Heart Failure Stewardship Pharmacist Progress Note ? ? ?PCP: [name], MD ?PCP-Cardiologist: [name], MD  ? ? ?HPI:  ?87 yo F with PMH of CHF, CAD s/p CABG, rheumatic heart disease, afib, history of frequent UTIs, and CKD. She presented to the ED on 4/11 with shortness of breath, LE edema, and orthopnea. CXR with possible mild interstitial edema. Her last ECHO was done 10/30/21 and LVEF was 60-65% with mildly reduced RV. ? ?Current HF Medications: ?Diuretic: furosemide 40 mg IV daily ?Beta Blocker: metoprolol XL 25 mg daily ? ?Prior to admission HF Medications: ?Diuretic: torsemide 40 mg BID ?Beta blocker: metoprolol XL 25 mg daily ? ?Pertinent Lab Values: ?Serum creatinine 1.05, BUN 13, Potassium 4.1, Sodium 140, BNP 184.1, Magnesium 1.9  ? ?Vital Signs: ?Weight: 151 lbs (admission weight: 150 lbs) ?Blood pressure: 90-120/50s  ?Heart rate: 70-100s  ?I/O: -1.3L yesterday; net -1.1L ? ?Medication Assistance / Insurance Benefits Check: ?Does the patient have prescription insurance?  Yes ?Type of insurance plan: [facility] Medicare ? ?Outpatient Pharmacy:  ?Prior to admission outpatient pharmacy: [facility] ?Is the patient willing to use [place] pharmacy at discharge? Yes ?Is the patient willing to transition their outpatient pharmacy to utilize a [facility] outpatient pharmacy?   Pending ?  ? ?Assessment: ?1. Acute on chronic diastolic CHF (EF 49-44%). NYHA class III symptoms. ?- Agree with adding furosemide 40 mg IV daily ?- Continue metoprolol XL 25 mg daily ?- BP variable for ACE/ARB/ARNI or spironolactone, will await for stabilization before recommending to initiate therapy ?- Caution SGLT2i with history of UTIs ?  ?Plan: ?1) Medication changes recommended at this time: ?- Agree with changes ? ?2) Patient assistance: ?- None pending ? ?3)  Education  ?- To be completed prior to discharge ? ?Kerby Nora, PharmD, BCPS ?Heart Failure Stewardship Pharmacist ?Phone [phone] ? ? ?

## 2022-01-06 NOTE — Progress Notes (Signed)
?PROGRESS NOTE ?Catherine [name]  JGG:[id number] DOB: 1935-02-04 DOA: 01/05/2022 ?PCP: [name], MD  ? ?Brief Narrative/Hospital Course: ?87 year old with history of chronic diastolic CHF on torsemide, A-fib on Coumadin, COPD, CAD, s/p bioprosthetic MVR/CABG x1/maze, hyperlipidemia presenting with progressively worsening dyspnea on exertion and peripheral edema.  BNP not significantly elevated but appears volume overloaded on exam and chest x-ray showing pulmonary edema.  Troponin negative.  She was given IV Lasix 60 mg.  O2 saturations as low as 88 to 87% placed on 3 L oxygen for comfort. ?Cardiology was consulted for acute hypoxic aspiratory failure in the setting of COPD and acute on chronic diastolic CHF, also with paroxysmal A-fib/junctional rhythm.  ?  ?Subjective: ?Seen this am ?Tmax of 100.7 last night at 11:50 PM ?Saturating well on 3 L nasal cannula no tachypnea or significant tachycardia. ?Patient had some vivid nightmares dream this morning and anxious/short of breath ?Got nebs earlier, having productive cough and feels worse today ?Legs edematous ? ?Assessment and Plan: ?Principal Problem: ?  Acute respiratory failure with hypoxia  secondary to acute on chronic COPD and diastolic congestive heart failure exacerbation ?Active Problems: ?  Transient hypotension ?  Atrial fibrillation ([place]) ?  Hypokalemia ?  COPD with acute exacerbation ([place]) ?  Acute on chronic diastolic heart failure ([place]) ?  Coronary artery disease ?  S/P mitral valve replacement with bioprosthetic valve + CABG x1 + maze procedure ?  Dyslipidemia ? ?Acute hypoxic aspiratory failure ?AECOPD w/ acute bronchitis ?Acute exacerbation of diastolic chf: ?Shortness of breath and acute respiratory failure with hypoxia in the setting of COPD exacerbation and acute on chronic HFpEF.Not on home o2, on 3LNC. Cont rocephin/doxy as she is having productive cough. S/p IV Lasix in the ED, cardiology following, Cont [place] o2, bronchodialtoers, switch  to iv steroid, iv lasix. Cont to address underlying copd and copd as below. ? ?Transient hypotension in the ED.resolved. ? ?Paroxysmal atrial fibrillation/junctional rhythm ?Status post bioprosthetic MVR/CABG X1/Maze: ?TSH 1.7.  Continue Coumadin pharmacy managing, therapeutic.  Cardiology following heart rate is stable on metoprolol ? ?Hyperlipidemia: Continue Crestor ? ?Hypokalemia: Monitor and supplement with potassium ? ?CAD: No chest pain.  Serial troponin negative on admission.  Patient on Crestor and metoprolol and Coumadin. ?Anxiety- cont home valiu, add atarax prn ? ? ?DVT prophylaxis: Place TED hose Start: 01/06/22 [id number] ?Code Status:   Code Status: Full Code ?Family Communication: plan of care discussed with patient at bedside. ?Patient status is: INPATIENT Level of care: Telemetry Cardiac  ?Remains inpatient because: ongoing management of shortness of breath  ?Patient currently not stable ? ?Dispo: The patient is from: home, with husband ?           Anticipated disposition: Home ? ?Mobility Assessment (last 72 hours)   ? ? Mobility Assessment   ? ? [place] Name 01/06/22 0715 01/05/22 2009 01/05/22 0900 01/05/22 0856  ?  ? Does patient have an order for bedrest or is patient medically unstable No - Continue assessment No - Continue assessment No - Continue assessment No - Continue assessment   ? What is the highest level of mobility based on the progressive mobility assessment? Level 4 (Walks with assist in room) - Balance while marching in place and cannot step forward and back - Complete Level 3 (Stands with assist) - Balance while standing  and cannot march in place Level 3 (Stands with assist) - Balance while standing  and cannot march in place Level 3 (Stands with assist) - Balance  while standing  and cannot march in place   ? Is the above level different from baseline mobility prior to current illness? Yes - Recommend PT order Yes - Recommend PT order Yes - Recommend PT order Yes - Recommend PT  order   ? ?  ?  ? ?  ?  ? ?Objective: ?Vitals last 24 hrs: ?Vitals:  ? 01-30-2022 2346 01/06/22 0400 01/06/22 0801 01/06/22 0808  ?BP: (!) 105/49 (!) 99/52 127/64   ?Pulse: 73 70 87 (!) 102  ?Resp: '20 18 14 20  '$ ?Temp: 98.3 ?F (36.8 ?C) 98.1 ?F (36.7 ?C) 97.7 ?F (36.5 ?C)   ?TempSrc: Oral Oral Axillary   ?SpO2: 95% 96% 97% 97%  ?Weight:  68.9 kg    ?Height:      ? ?Weight change: -0.547 kg ? ?Physical Examination: ?General exam: AAOX3, abel to speak in full sentences older than stated age, weak appearing. ?HEENT:Oral mucosa moist, Ear/Nose WNL grossly, dentition normal. ?Respiratory system: bilaterally diminished BS w/ exp wheezes, no use of accessory muscle ?Cardiovascular system: S1 & S2 +, No JVD,. ?Gastrointestinal system: Abdomen soft,NT,ND, BS+ ?Nervous System:Alert, awake, moving extremities and grossly nonfocal ?Extremities: LE edema + with skin wrinkling,distal peripheral pulses palpable.  ?Skin: No rashes,no icterus. ?MSK: Normal muscle bulk,tone, power ? ?Medications reviewed:  ?Scheduled Meds: ? arformoterol  15 mcg Nebulization BID  ? budesonide (PULMICORT) nebulizer solution  0.5 mg Nebulization BID  ? guaiFENesin  600 mg Oral BID  ? ipratropium-albuterol  3 mL Nebulization TID  ? mouth rinse  15 mL Mouth Rinse BID  ? metoprolol succinate  25 mg Oral Daily  ? potassium chloride  40 mEq Oral BID  ? predniSONE  40 mg Oral Q breakfast  ? [START ON 01/07/2022] rosuvastatin  5 mg Oral Once per day on Sun Tue Thu Sat  ? sodium chloride flush  3 mL Intravenous Q12H  ? Warfarin - Pharmacist Dosing Inpatient   Does not apply P5093  ? ?Continuous Infusions: ? sodium chloride    ? cefTRIAXone (ROCEPHIN)  IV Stopped (Jan 30, 2022 1711)  ? ? ?  ?Diet Order   ? ?       ?  Diet Heart Room service appropriate? Yes; Fluid consistency: Thin; Fluid restriction: 1800 mL Fluid  Diet effective now       ?  ? ?  ?  ? ?  ?  ? ?  ?  ?  ? ? ?Intake/Output Summary (Last 24 hours) at 01/06/2022 0844 ?Last data filed at 01/06/2022  0600 ?Gross per 24 hour  ?Intake 220 ml  ?Output 600 ml  ?Net -380 ml  ? ?Net IO Since Admission: -1,080 mL [01/06/22 0844]  ?Wt Readings from Last 3 Encounters:  ?01/06/22 68.9 kg  ?12/21/21 69.2 kg  ?11/30/21 66.7 kg  ?  ? ?Unresulted Labs (From admission, onward)  ? ?  Start     Ordered  ? 01/06/22 2671  Basic metabolic panel  Daily,   R     ?Question:  Specimen collection method  Answer:  Lab=Lab collect  ? 01/30/22 0955  ? ?  ?  ? ?  ?Data Reviewed: I have personally reviewed following labs and imaging studies ?CBC: ?Recent Labs  ?Lab 2022-01-30 ?0116  ?WBC 6.7  ?NEUTROABS 5.2  ?HGB 12.6  ?HCT 38.5  ?MCV 93.4  ?PLT 128*  ? ?Basic Metabolic Panel: ?Recent Labs  ?Lab 2022/01/30 ?0116 Jan 30, 2022 ?1041 01/06/22 ?2458  ?NA 139  --  140  ?K 3.3*  --  4.1  ?CL 103  --  106  ?CO2 24  --  28  ?GLUCOSE 128*  --  158*  ?BUN 21  --  13  ?CREATININE 0.97  --  1.05*  ?CALCIUM 9.4  --  8.9  ?MG  --  1.9  --   ? ?GFR: ?Estimated Creatinine Clearance: 37.1 mL/min (A) (by C-G formula based on SCr of 1.05 mg/dL (H)). ?Liver Function Tests: ?Recent Labs  ?Lab 01/05/22 ?0116  ?AST 19  ?ALT 21  ?ALKPHOS 87  ?BILITOT 0.2*  ?PROT 7.1  ?ALBUMIN 4.1  ? ?No results for input(s): LIPASE, AMYLASE in the last 168 hours. ?No results for input(s): AMMONIA in the last 168 hours. ?Coagulation Profile: ?Recent Labs  ?Lab 01/05/22 ?0116  ?INR 3.0*  ? ?BNP (last 3 results) ?No results for input(s): PROBNP in the last 8760 hours. ?HbA1C: ?No results for input(s): HGBA1C in the last 72 hours. ?CBG: ?No results for input(s): GLUCAP in the last 168 hours. ?Lipid Profile: ?No results for input(s): CHOL, HDL, LDLCALC, TRIG, CHOLHDL, LDLDIRECT in the last 72 hours. ?Thyroid Function Tests: ?Recent Labs  ?  01/05/22 ?1041  ?TSH 1.716  ? ?Sepsis Labs: ?Recent Labs  ?Lab 01/05/22 ?1041  ?PROCALCITON 0.11  ? ? ?Recent Results (from the past 240 hour(s))  ?MRSA Next Gen by PCR, Nasal     Status: None  ? Collection Time: 01/05/22 10:40 AM  ? Specimen: Nasal Mucosa;  Nasal Swab  ?Result Value Ref Range Status  ? MRSA by PCR Next Gen NOT DETECTED NOT DETECTED Final  ?  Comment: (NOTE) ?The GeneXpert MRSA Assay (FDA approved for NASAL specimens only), ?is one component of a

## 2022-01-06 NOTE — Plan of Care (Signed)
?  Problem: Education: ?Goal: Ability to demonstrate management of disease process will improve ?01/06/2022 0244 by Trellis Moment, RN ?Outcome: Progressing ?  ?Problem: Education: ?Goal: Ability to verbalize understanding of medication therapies will improve ?01/06/2022 0244 by Trellis Moment, RN ?Outcome: Progressing ?  ?Problem: Education: ?Goal: Individualized Educational Video(s) ?01/06/2022 0244 by Trellis Moment, RN ?Outcome: Progressing ?  ?Problem: Activity: ?Goal: Capacity to carry out activities will improve ?01/06/2022 0244 by Trellis Moment, RN ?Outcome: Progressing ?  ?Problem: Cardiac: ?Goal: Ability to achieve and maintain adequate cardiopulmonary perfusion will improve ?01/06/2022 0244 by Trellis Moment, RN ?Outcome: Progressing ?  ?Problem: Education: ?Goal: Knowledge of General Education information will improve ?Description: Including pain rating scale, medication(s)/side effects and non-pharmacologic comfort measures ?Outcome: Progressing ?  ?Problem: Health Behavior/Discharge Planning: ?Goal: Ability to manage health-related needs will improve ?Outcome: Progressing ?  ?Problem: Clinical Measurements: ?Goal: Ability to maintain clinical measurements within normal limits will improve ?Outcome: Progressing ?  ?Problem: Clinical Measurements: ?Goal: Will remain free from infection ?Outcome: Progressing ?  ?Problem: Clinical Measurements: ?Goal: Diagnostic test results will improve ?Outcome: Progressing ?  ?Problem: Clinical Measurements: ?Goal: Respiratory complications will improve ?Outcome: Progressing ?  ?Problem: Clinical Measurements: ?Goal: Cardiovascular complication will be avoided ?Outcome: Progressing ?  ?Problem: Activity: ?Goal: Risk for activity intolerance will decrease ?Outcome: Progressing ?  ?Problem: Nutrition: ?Goal: Adequate nutrition will be maintained ?Outcome: Progressing ?  ?Problem: Coping: ?Goal: Level of anxiety will decrease ?Outcome: Progressing ?  ?Problem: Elimination: ?Goal: Will  not experience complications related to bowel motility ?Outcome: Progressing ?  ?Problem: Elimination: ?Goal: Will not experience complications related to urinary retention ?Outcome: Progressing ?  ?Problem: Pain Managment: ?Goal: General experience of comfort will improve ?Outcome: Progressing ?  ?Problem: Safety: ?Goal: Ability to remain free from injury will improve ?Outcome: Progressing ?  ?Problem: Skin Integrity: ?Goal: Risk for impaired skin integrity will decrease ?Outcome: Progressing ?  ?

## 2022-01-06 NOTE — Progress Notes (Signed)
SATURATION QUALIFICATIONS: (This note is used to comply with regulatory documentation for home oxygen) ? ?Patient Saturations on Room Air at Rest = 88% ? ?Patient Saturations on Room Air while Ambulating = Not tested due to < 90% on RA at rest ? ?Patient Saturations on 3 Liters of oxygen while Ambulating = 90% ? ?Please briefly explain why patient needs home oxygen: To maintain oxygen saturation > 90% at rest and with activity. ? ?Wyona Almas, PT, DPT ?Acute Rehabilitation Services ?Pager [phone] ?Office [phone] ? ?

## 2022-01-06 NOTE — Progress Notes (Signed)
?   01/06/22 2121  ?Assess: MEWS Score  ?ECG Heart Rate (!) 140  ?Resp 16  ?Level of Consciousness Alert  ?Assess: MEWS Score  ?MEWS Temp 0  ?MEWS Systolic 0  ?MEWS Pulse 3  ?MEWS RR 0  ?MEWS LOC 0  ?MEWS Score 3  ?MEWS Score Color Yellow  ?Assess: if the MEWS score is Yellow or Red  ?Were vital signs taken at a resting state? Yes  ?Focused Assessment Change from prior assessment (see assessment flowsheet)  ?Early Detection of Sepsis Score *See Row Information* Low  ?MEWS guidelines implemented *See Row Information* Yes  ?Treat  ?MEWS Interventions Escalated (See documentation below)  ?Pain Scale 0-10  ?Pain Score 0  ?Notify: Charge Nurse/RN  ?Name of Charge Nurse/RN Notified Rondall Allegra, RN  ?Date Charge Nurse/RN Notified 01/06/22  ?Time Charge Nurse/RN Notified 2130  ?Notify: Provider  ?Provider Name/Title Henreitta Leber, MD  ?Date Provider Notified 01/06/22  ?Time Provider Notified 2119  ?Notification Type Page  ?Notification Reason Change in status  ?Provider response No new orders  ?Date of Provider Response 01/06/22  ?Time of Provider Response 2121  ? ?Pt is having multiple asymptomatic runs of tachycardia and has hx of A fib. Dr Alcario Drought was notified and instructed RN to monitor only. No new orders unless Pt becomes symptomatic. ?

## 2022-01-06 NOTE — Progress Notes (Signed)
ANTICOAGULATION CONSULT NOTE ? ?Pharmacy Consult for Warfarin ?Indication: atrial fibrillation ? ?Allergies  ?Allergen Reactions  ? Aldactone [Spironolactone] Other (See Comments)  ?  dyspnea  ? Amoxicillin Palpitations  ?  Tachycardia ?Has patient had a PCN reaction causing immediate rash, facial/tongue/throat swelling, SOB or lightheadedness with hypotension: no ?Has patient had a PCN reaction causing severe rash involving mucus membranes or skin necrosis: {no ?Has patient had a PCN reaction that required hospitalization {no ?Has patient had a PCN reaction occurring within the last 10 years: {yes ?If all of the above answers are "NO", then may proceed with Cephalosporin use.  ? Diltiazem Other (See Comments)  ?  Causing headaches ?  ? Flagyl [Metronidazole Hcl] Other (See Comments)  ?  Causing headaches ? ?  ? Flovent [Fluticasone Propionate] Other (See Comments)  ?  Leg cramps  ? Gabapentin Swelling  ? Lyrica [Pregabalin] Swelling  ? Quinidine Diarrhea and Other (See Comments)  ?  Fever diarrhea  ? Simvastatin Other (See Comments)  ?  Leg pain, myalgia  ? Tramadol Nausea Only  ? Verapamil Other (See Comments)  ?  myalgias  ? Amlodipine   ?  Low extremity edema  ? Crestor [Rosuvastatin]   ?  myalgia  ? Livalo [Pitavastatin]   ?  myalgias  ? Zetia [Ezetimibe]   ?  LEG CRAMPS ?  ? Benazepril Hcl Cough  ? Ciprofloxacin Diarrhea  ? Codeine Nausea Only  ? Nitrofurantoin Monohyd Macro Nausea Only  ? ? ?Patient Measurements: ?Height: '5\' 3"'$  (160 cm) ?Weight: 68.9 kg (151 lb 14.4 oz) ?IBW/kg (Calculated) : 52.4 ? ? ?Vital Signs: ?Temp: 97.7 ?F (36.5 ?C) (04/12 0801) ?Temp Source: Axillary (04/12 0801) ?BP: 127/64 (04/12 0801) ?Pulse Rate: 102 (04/12 0808) ? ?Labs: ?Recent Labs  ?  01/05/22 ?0116 01/05/22 ?0313 01/06/22 ?3614 01/06/22 ?4315  ?HGB 12.6  --   --  11.4*  ?HCT 38.5  --   --  35.6*  ?PLT 128*  --   --  130*  ?LABPROT 30.6*  --   --  31.6*  ?INR 3.0*  --   --  3.1*  ?CREATININE 0.97  --  1.05*  --   ?TROPONINIHS  10 10  --   --   ? ? ?Estimated Creatinine Clearance: 37.1 mL/min (A) (by C-G formula based on SCr of 1.05 mg/dL (H)). ? ? ?PTA Anticoagulation:  ?Home warfarin dose 2 mg daily except 1 mg on Wed and Sat. ?  ?Assessment: ?87 yo female with worsening SOB and lower extremity edema. Patient on warfarin chronically for atrial fibrillation.  Pharmacy consulted to continue warfarin therapy. INR above therapeutic goal x2 days, currently 3.1. Received warfarin '1mg'$  x1 on 4/11. CBC stable, platelets 130 (appears baseline). No s/sx bleeding reported.  ? ?Goal of Therapy:  ?INR 2-3 ?Monitor platelets by anticoagulation protocol: Yes ?  ?Plan:  ?Hold warfarin x1 dose this evening ?Check INR daily while on warfarin ?Continue to monitor H&H and platelets ? ? ? ?Thank you for allowing pharmacy to be a part of this patient?s care. ? ?Ardyth Harps, PharmD ?Clinical Pharmacist ? ? ? ?

## 2022-01-06 NOTE — Evaluation (Signed)
Physical Therapy Evaluation ?Patient Details ?Name: Catherine [name] ?MRN: [id number] ?DOB: August 03, 1935 ?Today's Date: 01/06/2022 ? ?History of Present Illness ? Pt is a 87 y.o. F who presents 01/05/2022 with complaints of progressively worsening shortness of breath over the last 4 days. Admitted with acute respiratory failure with hypoxia secondary to acute on chronic COPD and diastolic congestive heart failure exacerbation. Significant PMH: chronic diastolic CHF, moderate triscuspid regurg, CAD s/p CABG, rheumatic heart disease, chronic atrial fibrillation on Coumadin, CKD stage III.  ?Clinical Impression ? PTA, pt lives with her family and is independent and active; she attends [employer] 3 days/wk. Pt presents with decreased cardiopulmonary endurance and mild balance deficits. Pt ambulating 400 ft with no assistive device at a supervision level; requires one standing rest break. HR 107-134 bpm, SpO2 > 90% on 3L O2. Will continue to progress mobility as tolerated. ?   ? ?Recommendations for follow up therapy are one component of a multi-disciplinary discharge planning process, led by the attending physician.  Recommendations may be updated based on patient status, additional functional criteria and insurance authorization. ? ?Follow Up Recommendations No PT follow up ? ?  ?Assistance Recommended at Discharge PRN  ?Patient can return home with the following ? Assistance with cooking/housework;Help with stairs or ramp for entrance ? ?  ?Equipment Recommendations None recommended by PT  ?Recommendations for Other Services ?    ?  ?Functional Status Assessment Patient has had a recent decline in their functional status and demonstrates the ability to make significant improvements in function in a reasonable and predictable amount of time.  ? ?  ?Precautions / Restrictions Precautions ?Precautions: Other (comment) ?Precaution Comments: watch O2 ?Restrictions ?Weight Bearing Restrictions: No  ? ?  ? ?Mobility ? Bed  Mobility ?Overal bed mobility: Modified Independent ?  ?  ?  ?  ?  ?  ?  ?  ? ?Transfers ?Overall transfer level: Independent ?Equipment used: None ?  ?  ?  ?  ?  ?  ?  ?  ?  ? ?Ambulation/Gait ?Ambulation/Gait assistance: Supervision ?Gait Distance (Feet): 400 Feet ?Assistive device: None ?Gait Pattern/deviations: Step-through pattern, Decreased stride length ?Gait velocity: decreased ?  ?  ?General Gait Details: Pt with one initial lateral LOB, but able to self correct. Otherwise, slow and steady pace. Pt requiring one seated rest break, cues for activity pacing and pursed lip breathing ? ?Stairs ?  ?  ?  ?  ?  ? ?Wheelchair Mobility ?  ? ?Modified Rankin (Stroke Patients Only) ?  ? ?  ? ?Balance Overall balance assessment: Mild deficits observed, not formally tested ?  ?  ?  ?  ?  ?  ?  ?  ?  ?  ?  ?  ?  ?  ?  ?  ?  ?  ?   ? ? ? ?Pertinent Vitals/Pain Pain Assessment ?Pain Assessment: No/denies pain  ? ? ?Home Living Family/patient expects to be discharged to:: Private residence ?Living Arrangements: Spouse/significant other;Children ?Available Help at Discharge: Family ?Type of Home: House ?Home Access: Stairs to enter ?Entrance Stairs-Rails: Right;Left ?Entrance Stairs-Number of Steps: 3 ?  ?Home Layout: One level ?Home Equipment: Conservation officer, nature (2 wheels);Shower seat;BSC/3in1 ?   ?  ?Prior Function Prior Level of Function : Independent/Modified Independent ?  ?  ?  ?  ?  ?  ?Mobility Comments: Does [employer] ?  ?  ? ? ?Hand Dominance  ?   ? ?  ?Extremity/Trunk Assessment  ?   ?  ? ?  Lower Extremity Assessment ?Lower Extremity Assessment: RLE deficits/detail;LLE deficits/detail ?RLE Deficits / Details: Grossly 5/5, increased edema distally ?LLE Deficits / Details: Grossly 5/5, increased edema distally ?  ? ?Cervical / Trunk Assessment ?Cervical / Trunk Assessment: Normal  ?Communication  ? Communication: No difficulties  ?Cognition Arousal/Alertness: Awake/alert ?Behavior During Therapy: [facility] for tasks  assessed/performed ?Overall Cognitive Status: Within Functional Limits for tasks assessed ?  ?  ?  ?  ?  ?  ?  ?  ?  ?  ?  ?  ?  ?  ?  ?  ?  ?  ?  ? ?  ?General Comments   ? ?  ?Exercises    ? ?Assessment/Plan  ?  ?PT Assessment Patient needs continued PT services  ?PT Problem List Decreased strength;Decreased activity tolerance;Decreased balance;Decreased mobility ? ?   ?  ?PT Treatment Interventions DME instruction;Gait training;Stair training;Functional mobility training;Therapeutic activities;Therapeutic exercise;Balance training;Patient/family education   ? ?PT Goals (Current goals can be found in the Care Plan section)  ?Acute Rehab PT Goals ?Patient Stated Goal: return to prior level of activity ?PT Goal Formulation: With patient ?Time For Goal Achievement: 01/20/22 ?Potential to Achieve Goals: Good ? ?  ?Frequency Min 3X/week ?  ? ? ?Co-evaluation   ?  ?  ?  ?  ? ? ?  ?AM-PAC PT "6 Clicks" Mobility  ?Outcome Measure Help needed turning from your back to your side while in a flat bed without using bedrails?: None ?Help needed moving from lying on your back to sitting on the side of a flat bed without using bedrails?: None ?Help needed moving to and from a bed to a chair (including a wheelchair)?: None ?Help needed standing up from a chair using your arms (e.g., wheelchair or bedside chair)?: None ?Help needed to walk in hospital room?: A Little ?Help needed climbing 3-5 steps with a railing? : A Little ?6 Click Score: 22 ? ?  ?End of Session Equipment Utilized During Treatment: Oxygen ?Activity Tolerance: Patient tolerated treatment well ?Patient left: in chair;with call bell/phone within reach ?Nurse Communication: Mobility status ?PT Visit Diagnosis: Unsteadiness on feet (R26.81);Difficulty in walking, not elsewhere classified (R26.2) ?  ? ?Time: 0930-1003 ?PT Time Calculation (min) (ACUTE ONLY): 33 min ? ? ?Charges:   PT Evaluation ?$PT Eval Low Complexity: 1 Low ?PT Treatments ?$Therapeutic Activity: 8-22  mins ?  ?   ? ? ?Wyona Almas, PT, DPT ?Acute Rehabilitation Services ?Pager [phone] ?Office [phone] ? ? ?Carloine Margo Aye ?01/06/2022, 12:26 PM ? ?

## 2022-01-06 NOTE — Plan of Care (Signed)
?  Problem: Coping: ?Goal: Level of anxiety will decrease ?Outcome: Not Progressing ?  ?Problem: Activity: ?Goal: Capacity to carry out activities will improve ?Outcome: Progressing ?  ?Problem: Cardiac: ?Goal: Ability to achieve and maintain adequate cardiopulmonary perfusion will improve ?Outcome: Progressing ?  ?Problem: Health Behavior/Discharge Planning: ?Goal: Ability to manage health-related needs will improve ?Outcome: Progressing ?  ?Problem: Education: ?Goal: Knowledge of General Education information will improve ?Description: Including pain rating scale, medication(s)/side effects and non-pharmacologic comfort measures ?Outcome: Progressing ?  ?Problem: Clinical Measurements: ?Goal: Ability to maintain clinical measurements within normal limits will improve ?Outcome: Progressing ?  ?Problem: Clinical Measurements: ?Goal: Will remain free from infection ?Outcome: Progressing ?  ?

## 2022-01-06 NOTE — Progress Notes (Signed)
Pt woke from vivid dream at [place] and had multiple 1-3 minute episodes of tachycardia reaching HR in 120's. MD was notified and episodes resolved without intervention. RN will continue to monitor for further episodes. ?

## 2022-01-07 ENCOUNTER — Telehealth: Payer: Medicare Other

## 2022-01-07 DIAGNOSIS — J9601 Acute respiratory failure with hypoxia: Secondary | ICD-10-CM | POA: Diagnosis not present

## 2022-01-07 LAB — BASIC METABOLIC PANEL
Anion gap: 6 (ref 5–15)
BUN: 22 mg/dL (ref 8–23)
CO2: 27 mmol/L (ref 22–32)
Calcium: 9 mg/dL (ref 8.9–10.3)
Chloride: 107 mmol/L (ref 98–111)
Creatinine, Ser: 0.92 mg/dL (ref 0.44–1.00)
GFR, Estimated: >60 mL/min (ref >60)
Glucose, Bld: 200 mg/dL — ABNORMAL HIGH (ref 70–99)
Potassium: 4.5 mmol/L (ref 3.5–5.1)
Sodium: 140 mmol/L (ref 135–145)

## 2022-01-07 LAB — CBC
HCT: 35.5 % — ABNORMAL LOW (ref 36.0–46.0)
Hemoglobin: 11.5 g/dL — ABNORMAL LOW (ref 12.0–15.0)
MCH: 30.9 pg (ref 26.0–34.0)
MCHC: 32.4 g/dL (ref 30.0–36.0)
MCV: 95.4 fL (ref 80.0–100.0)
Platelets: 133 10*3/uL — ABNORMAL LOW (ref 150–400)
RBC: 3.72 MIL/uL — ABNORMAL LOW (ref 3.87–5.11)
RDW: 13.4 % (ref 11.5–15.5)
WBC: 8.1 10*3/uL (ref 4.0–10.5)
nRBC: 0 % (ref 0.0–0.2)

## 2022-01-07 LAB — PROTIME-INR
INR: 3.1 — ABNORMAL HIGH (ref 0.8–1.2)
Prothrombin Time: 32.1 seconds — ABNORMAL HIGH (ref 11.4–15.2)

## 2022-01-07 MED ORDER — POTASSIUM CHLORIDE CRYS ER 20 MEQ PO TBCR
40.0000 meq | EXTENDED_RELEASE_TABLET | Freq: Every day | ORAL | Status: DC
  Administered 2022-01-08 – 2022-01-12 (×5): 40 meq via ORAL
  Filled 2022-01-07 (×5): qty 2

## 2022-01-07 NOTE — Progress Notes (Signed)
ANTICOAGULATION CONSULT NOTE - Follow Up Consult ? ?Pharmacy Consult for Warfarin ?Indication: atrial fibrillation ? ?Allergies  ?Allergen Reactions  ? Aldactone [Spironolactone] Other (See Comments)  ?  dyspnea  ? Amoxicillin Palpitations  ?  Tachycardia ?Has patient had a PCN reaction causing immediate rash, facial/tongue/throat swelling, SOB or lightheadedness with hypotension: no ?Has patient had a PCN reaction causing severe rash involving mucus membranes or skin necrosis: {no ?Has patient had a PCN reaction that required hospitalization {no ?Has patient had a PCN reaction occurring within the last 10 years: {yes ?If all of the above answers are "NO", then may proceed with Cephalosporin use.  ? Diltiazem Other (See Comments)  ?  Causing headaches ?  ? Flagyl [Metronidazole Hcl] Other (See Comments)  ?  Causing headaches ? ?  ? Flovent [Fluticasone Propionate] Other (See Comments)  ?  Leg cramps  ? Gabapentin Swelling  ? Lyrica [Pregabalin] Swelling  ? Quinidine Diarrhea and Other (See Comments)  ?  Fever diarrhea  ? Simvastatin Other (See Comments)  ?  Leg pain, myalgia  ? Tramadol Nausea Only  ? Verapamil Other (See Comments)  ?  myalgias  ? Amlodipine   ?  Low extremity edema  ? Crestor [Rosuvastatin]   ?  myalgia  ? Livalo [Pitavastatin]   ?  myalgias  ? Zetia [Ezetimibe]   ?  LEG CRAMPS ?  ? Benazepril Hcl Cough  ? Ciprofloxacin Diarrhea  ? Codeine Nausea Only  ? Nitrofurantoin Monohyd Macro Nausea Only  ? ? ?Patient Measurements: ?Height: '5\' 3"'$  (160 cm) ?Weight: 69.4 kg (153 lb) ?IBW/kg (Calculated) : 52.4 ? ?Vital Signs: ?Temp: 97.7 ?F (36.5 ?C) (04/13 0712) ?Temp Source: Oral (04/13 1975) ?BP: 143/63 (04/13 0724) ?Pulse Rate: 100 (04/13 0843) ? ?Labs: ?Recent Labs  ?  01/05/22 ?0116 01/05/22 ?0313 01/06/22 ?8832 01/06/22 ?5498 01/07/22 ?0022  ?HGB 12.6  --   --  11.4* 11.5*  ?HCT 38.5  --   --  35.6* 35.5*  ?PLT 128*  --   --  130* 133*  ?LABPROT 30.6*  --   --  31.6* 32.1*  ?INR 3.0*  --   --  3.1* 3.1*   ?CREATININE 0.97  --  1.05*  --  0.92  ?TROPONINIHS 10 10  --   --   --   ? ? ?Estimated Creatinine Clearance: 42.5 mL/min (by C-G formula based on SCr of 0.92 mg/dL). ? ? ?PTA Anticoagulation: ?Home warfarin 2 mg daily except 1 mg on Wednesday and Saturday  ? ?Assessment: ?On warfarin PTA for atrial fibrillation, also has bioprosthetic MVR (2018). Received warfarin 1 mg x 1 on 4/11. PLTs and Hgb are stable. INR 3.1 unchanged after holding dose on 4/12. No s/sx bleeding reported and eating well.  ? ?Noted possible DDI with doxycycline.  ? ?Goal of Therapy:  ?INR 2-3 ?Monitor platelets by anticoagulation protocol: Yes ?  ?Plan:  ?Hold warfarin today  ?Check INR daily ?Monitor Hgb/Hct and PLTs daily  ? ?Eliseo Gum, PharmD Candidate  ?01/07/2022 10:55 AM  ? ? ?

## 2022-01-07 NOTE — Plan of Care (Signed)
?  Problem: Education: ?Goal: Ability to demonstrate management of disease process will improve ?Outcome: Progressing ?Goal: Ability to verbalize understanding of medication therapies will improve ?Outcome: Progressing ?Goal: Individualized Educational Video(s) ?Outcome: Progressing ?  ?Problem: Activity: ?Goal: Capacity to carry out activities will improve ?Outcome: Progressing ?  ?Problem: Cardiac: ?Goal: Ability to achieve and maintain adequate cardiopulmonary perfusion will improve ?Outcome: Progressing ?  ?Problem: Education: ?Goal: Knowledge of General Education information will improve ?Description: Including pain rating scale, medication(s)/side effects and non-pharmacologic comfort measures ?Outcome: Progressing ?  ?Problem: Health Behavior/Discharge Planning: ?Goal: Ability to manage health-related needs will improve ?Outcome: Progressing ?  ?Problem: Clinical Measurements: ?Goal: Ability to maintain clinical measurements within normal limits will improve ?Outcome: Progressing ?Goal: Will remain free from infection ?Outcome: Progressing ?Goal: Diagnostic test results will improve ?Outcome: Progressing ?Goal: Cardiovascular complication will be avoided ?Outcome: Progressing ?  ?Problem: Activity: ?Goal: Risk for activity intolerance will decrease ?Outcome: Progressing ?  ?Problem: Nutrition: ?Goal: Adequate nutrition will be maintained ?Outcome: Progressing ?  ?Problem: Coping: ?Goal: Level of anxiety will decrease ?Outcome: Progressing ?  ?Problem: Elimination: ?Goal: Will not experience complications related to bowel motility ?Outcome: Progressing ?Goal: Will not experience complications related to urinary retention ?Outcome: Progressing ?  ?Problem: Pain Managment: ?Goal: General experience of comfort will improve ?Outcome: Progressing ?  ?Problem: Safety: ?Goal: Ability to remain free from injury will improve ?Outcome: Progressing ?  ?Problem: Skin Integrity: ?Goal: Risk for impaired skin integrity will  decrease ?Outcome: Progressing ?  ?Problem: Clinical Measurements: ?Goal: Respiratory complications will improve ?Outcome: Not Progressing ?  ?

## 2022-01-07 NOTE — Progress Notes (Signed)
?PROGRESS NOTE ?[name]  SHF:[id number] DOB: 16-May-1935 DOA: 01/05/2022 ?PCP: [name], MD  ? ?Brief Narrative/Hospital Course: ?87 year old with history of chronic diastolic CHF on torsemide, A-fib on Coumadin, COPD, CAD, s/p bioprosthetic MVR/CABG x1/maze, hyperlipidemia presenting with progressively worsening dyspnea on exertion and peripheral edema.  BNP not significantly elevated but appears volume overloaded on exam and chest x-ray showing pulmonary edema.  Troponin negative.  She was given IV Lasix 60 mg.  O2 saturations as low as 88 to 87% placed on 3 L oxygen for comfort. ?Cardiology was consulted for acute hypoxic aspiratory failure in the setting of COPD and acute on chronic diastolic CHF, also with paroxysmal A-fib/junctional rhythm. ? ?Subjective: ?Seen and examined this morning.  She tells me she feels better this morning ?Overnight multiple asymptomatic runs of tachycardia ?Afebrile, on 3 L nasal cannula BP stable ?Labs stable INR 3.1 ? ?Assessment and Plan: ?Principal Problem: ?  Acute respiratory failure with hypoxia  secondary to acute on chronic COPD and diastolic congestive heart failure exacerbation ?Active Problems: ?  Transient hypotension ?  Atrial fibrillation ([place]) ?  Hypokalemia ?  COPD with acute exacerbation ([place]) ?  Acute on chronic diastolic heart failure ([place]) ?  Coronary artery disease ?  S/P mitral valve replacement with bioprosthetic valve + CABG x1 + maze procedure ?  Dyslipidemia ? ?Acute hypoxic aspiratory failure ?AECOPD w/ acute bronchitis ?Acute exacerbation of diastolic chf: ?Respiratory distress and acute respiratory failure with hypoxia in the setting of COPD exacerbation and acute on chronic HFpEF.Not on home o2 currently on on 3LNC.  Overall clinically improving-still has wheezing.Cont rocephin/doxy as she is having productive cough.  Along with bronchodialtoers, iv steroid, iv lasix.  Cardiology following closely.  Monitor intake output.  Cont to address  underlying copd and copd as below. ?Net IO Since Admission: -1,767 mL [01/07/22 1102]  ? ?Transient hypotension in the ED.resolved. ? ?Paroxysmal atrial fibrillation/junctional rhythm ?Status post bioprosthetic MVR/CABG X1/Maze ?Intermittent symptomatic tachycardia up to 140s during night: ?TSH 1.7.  Asymptomatic tachycardia overnight intermittently-metoprolol increased to 37.5 daily 4/13.Continue Coumadin INR therapeutic, pharmacy managing.  Appreciate cardiology input ? ?Hyperlipidemia: On Crestor ? ?Hypokalemia: Resolved ? ?CAD: No chest pain.  Serial troponin negative on admission.  Continue Crestor and metoprolol and Coumadin. ?Anxiety- cont home valiu, added atarax prn ? ?DVT prophylaxis: Place TED hose Start: 01/06/22 0816WARFARIN ?Code Status:   Code Status: Full Code ?Family Communication: plan of care discussed with patient at bedside. ?Patient status is: INPATIENT Level of care: Telemetry Cardiac  ?Remains inpatient because: ongoing management of shortness of breath  ?Patient currently not stable ? ?Dispo: The patient is from: home, with husband.  At Activity level 5,walking with assist in room/hall ?           Anticipated disposition: Home ? ?Mobility Assessment (last 72 hours)   ? ? Mobility Assessment   ? ? [place] Name 01/07/22 0800 01/06/22 2048 01/06/22 0936 01/06/22 0715 01/05/22 2009  ? Does patient have an order for bedrest or is patient medically unstable No - Continue assessment No - Continue assessment -- No - Continue assessment No - Continue assessment  ? What is the highest level of mobility based on the progressive mobility assessment? Level 5 (Walks with assist in room/hall) - Balance while stepping forward/back and can walk in room with assist - Complete Level 5 (Walks with assist in room/hall) - Balance while stepping forward/back and can walk in room with assist - Complete Level 5 (Walks with  assist in room/hall) - Balance while stepping forward/back and can walk in room with assist -  Complete Level 4 (Walks with assist in room) - Balance while marching in place and cannot step forward and back - Complete Level 3 (Stands with assist) - Balance while standing  and cannot march in place  ? Is the above level different from baseline mobility prior to current illness? Yes - Recommend PT order Yes - Recommend PT order -- Yes - Recommend PT order Yes - Recommend PT order  ? ? [place] Name 01/05/22 0900 01/05/22 0856  ?  ?  ?  ? Does patient have an order for bedrest or is patient medically unstable No - Continue assessment No - Continue assessment     ? What is the highest level of mobility based on the progressive mobility assessment? Level 3 (Stands with assist) - Balance while standing  and cannot march in place Level 3 (Stands with assist) - Balance while standing  and cannot march in place     ? Is the above level different from baseline mobility prior to current illness? Yes - Recommend PT order Yes - Recommend PT order     ? ?  ?  ? ?  ?  ? ?Objective: ?Vitals last 24 hrs: ?Vitals:  ? 01/07/22 0200 01/07/22 0400 01/07/22 0724 01/07/22 0843  ?BP: 120/88 (!) 126/53 (!) 143/63   ?Pulse: 65 67 70 100  ?Resp: '20 16 13   '$ ?Temp: 97.6 ?F (36.4 ?C) 97.7 ?F (36.5 ?C) 97.7 ?F (36.5 ?C)   ?TempSrc: Oral Oral Oral   ?SpO2: 96% 96% 99%   ?Weight:  69.4 kg    ?Height:      ? ?Weight change: 1 kg ? ?Physical Examination: ?General exam: AAOX3,older than stated age, weak appearing. ?HEENT:Oral mucosa moist, Ear/Nose WNL grossly, dentition normal. ?Respiratory system: bilaterally diminished with expiratory wheezing,no use of accessory muscle ?Cardiovascular system: S1 & S2 +, No JVD,. ?Gastrointestinal system: Abdomen soft,NT,ND, BS+ ?Nervous System:Alert, awake, moving extremities and grossly nonfocal ?Extremities: edema neg,distal peripheral pulses palpable.  ?Skin: No rashes,no icterus. ?MSK: Normal muscle bulk,tone, power ? ? ?Medications reviewed:  ?Scheduled Meds: ? arformoterol  15 mcg Nebulization BID  ?  budesonide (PULMICORT) nebulizer solution  0.5 mg Nebulization BID  ? doxycycline  100 mg Oral Q12H  ? feeding supplement  237 mL Oral BID BM  ? furosemide  40 mg Intravenous Daily  ? guaiFENesin  600 mg Oral BID  ? ipratropium-albuterol  3 mL Nebulization TID  ? mouth rinse  15 mL Mouth Rinse BID  ? methylPREDNISolone (SOLU-MEDROL) injection  80 mg Intravenous Daily  ? metoprolol succinate  37.5 mg Oral Daily  ? mometasone-formoterol  2 puff Inhalation BID  ? multivitamin with minerals  1 tablet Oral Daily  ? potassium chloride  40 mEq Oral BID  ? rosuvastatin  5 mg Oral Once per day on Sun Tue Thu Sat  ? sodium chloride flush  3 mL Intravenous Q12H  ? Warfarin - Pharmacist Dosing Inpatient   Does not apply W0981  ? ?Continuous Infusions: ? sodium chloride    ? cefTRIAXone (ROCEPHIN)  IV Stopped (01/06/22 1819)  ? ? ?  ?Diet Order   ? ?       ?  Diet Heart Room service appropriate? Yes; Fluid consistency: Thin; Fluid restriction: 1800 mL Fluid  Diet effective now       ?  ? ?  ?  ? ?  ?  ? ?  Nutrition Problem: Increased nutrient needs ?Etiology: chronic illness (CHF, COPD) ?Signs/Symptoms: estimated needs ?Interventions: Ensure Enlive (each supplement provides 350kcal and 20 grams of protein), MVI ? ? ?Intake/Output Summary (Last 24 hours) at 01/07/2022 1102 ?Last data filed at 01/07/2022 0800 ?Gross per 24 hour  ?Intake 460 ml  ?Output 1150 ml  ?Net -690 ml  ? ?Net IO Since Admission: -1,767 mL [01/07/22 1102]  ?Wt Readings from Last 3 Encounters:  ?01/07/22 69.4 kg  ?12/21/21 69.2 kg  ?11/30/21 66.7 kg  ?  ? ?Unresulted Labs (From admission, onward)  ? ?  Start     Ordered  ? 01/07/22 0500  Protime-INR  Daily,   R     ?Question:  Specimen collection method  Answer:  Lab=Lab collect  ?See Hyperspace for full Linked Orders Report.  ? 01/06/22 0909  ? 01/07/22 0500  CBC  Daily,   R     ?Question:  Specimen collection method  Answer:  Lab=Lab collect  ?See Hyperspace for full Linked Orders Report.  ? 01/06/22 0909  ?  01/06/22 8828  Basic metabolic panel  Daily,   R     ?Question:  Specimen collection method  Answer:  Lab=Lab collect  ? 01/05/22 0955  ? ?  ?  ? ?  ?Data Reviewed: I have personally reviewed following labs and

## 2022-01-07 NOTE — Progress Notes (Signed)
?  Mobility Specialist Criteria Algorithm Info. ? ? 01/07/22 1015  ?Mobility  ?Activity Ambulated with assistance in hallway  ?Range of Motion/Exercises Active;All extremities  ?Level of Assistance Contact guard assist, steadying assist  ?Assistive Device None ?(arm held)  ?Distance Ambulated (ft) 320 ft  ?Activity Response Tolerated well  ? ?Patient received in supine eager to participate in mobility. Was independent for bed mobility but required minimal HHA to boost from side lying to sitting. Ambulated in hallway min guard with slow steady gait. Oxygen maintained >93% on 2LO2 while ambulating. Returned to room without complaint or incident. Was left in recliner chair with all needs met, call bell in reach and NT present. ? ?01/07/2022 ?3:44 PM ? ?Martinique Tripp, CMS, BS EXP ?Acute Rehabilitation Services  ?JOINO:[phone] ?Office: [phone] ? ?

## 2022-01-07 NOTE — Plan of Care (Signed)
?  Problem: Clinical Measurements: ?Goal: Cardiovascular complication will be avoided ?Outcome: Not Progressing ? ?Problem: Education: ?Goal: Ability to demonstrate management of disease process will improve ?Outcome: Progressing ?Goal: Ability to verbalize understanding of medication therapies will improve ?Outcome: Progressing ?Goal: Individualized Educational Video(s) ?Outcome: Progressing ?  ?Problem: Activity: ?Goal: Capacity to carry out activities will improve ?Outcome: Progressing ?  ?Problem: Cardiac: ?Goal: Ability to achieve and maintain adequate cardiopulmonary perfusion will improve ?Outcome: Progressing ?  ?Problem: Education: ?Goal: Knowledge of General Education information will improve ?Description: Including pain rating scale, medication(s)/side effects and non-pharmacologic comfort measures ?Outcome: Progressing ?  ?Problem: Health Behavior/Discharge Planning: ?Goal: Ability to manage health-related needs will improve ?Outcome: Progressing ?  ?Problem: Clinical Measurements: ?Goal: Ability to maintain clinical measurements within normal limits will improve ?Outcome: Progressing ?Goal: Will remain free from infection ?Outcome: Progressing ?Goal: Diagnostic test results will improve ?Outcome: Progressing ?Goal: Respiratory complications will improve ?Outcome: Progressing ?  ?Problem: Activity: ?Goal: Risk for activity intolerance will decrease ?Outcome: Progressing ?  ?Problem: Nutrition: ?Goal: Adequate nutrition will be maintained ?Outcome: Progressing ?  ?Problem: Coping: ?Goal: Level of anxiety will decrease ?Outcome: Progressing ?  ?Problem: Elimination: ?Goal: Will not experience complications related to bowel motility ?Outcome: Progressing ?Goal: Will not experience complications related to urinary retention ?Outcome: Progressing ?  ?Problem: Pain Managment: ?Goal: General experience of comfort will improve ?Outcome: Progressing ?  ?Problem: Safety: ?Goal: Ability to remain free from injury  will improve ?Outcome: Progressing ?  ?Problem: Skin Integrity: ?Goal: Risk for impaired skin integrity will decrease ?Outcome: Progressing ?  ?

## 2022-01-07 NOTE — Progress Notes (Signed)
Heart Failure Stewardship Pharmacist Progress Note ? ? ?PCP: [name], MD ?PCP-Cardiologist: [name], MD  ? ? ?HPI:  ?87 yo F with PMH of CHF, CAD s/p CABG, rheumatic heart disease, afib, history of frequent UTIs, and CKD. She presented to the ED on 4/11 with shortness of breath, LE edema, and orthopnea. CXR with possible mild interstitial edema. Her last ECHO was done 10/30/21 and LVEF was 60-65% with mildly reduced RV. ? ?Current HF Medications: ?Diuretic: furosemide 40 mg IV daily ?Beta Blocker: metoprolol XL 37.5 mg daily ? ?Prior to admission HF Medications: ?Diuretic: torsemide 40 mg BID ?Beta blocker: metoprolol XL 25 mg daily ? ?Pertinent Lab Values: ?Serum creatinine 0.92, BUN 22, Potassium 4.5, Sodium 140, BNP 184.1 ? ?Vital Signs: ?Weight: 153 lbs (admission weight: 150 lbs) ?Blood pressure: 130/60s  ?Heart rate: 60s  ?I/O: -1L yesterday; net -1.8L ? ?Medication Assistance / Insurance Benefits Check: ?Does the patient have prescription insurance?  Yes ?Type of insurance plan: [facility] Medicare ? ?Outpatient Pharmacy:  ?Prior to admission outpatient pharmacy: [facility] ?Is the patient willing to use [place] pharmacy at discharge? Yes ?Is the patient willing to transition their outpatient pharmacy to utilize a [facility] outpatient pharmacy?   Pending ?  ? ?Assessment: ?1. Acute on chronic diastolic CHF (EF 35-00%). NYHA class III symptoms. ?- Continue furosemide 40 mg IV daily - I/O minimal and weight up today. Consider increasing to BID ?- Continue metoprolol XL 37.5 mg daily ?- Intolerance to benazepril - caused cough. Consider adding ARB ?- Intolerance to spironolactone - caused dyspnea ?- Caution SGLT2i with history of UTIs ?  ?Plan: ?1) Medication changes recommended at this time: ?- Increase furosemide to 40 mg IV BID if warranted clinically, I/O minimal and weight up ?- Add losartan 12.5 mg daily ? ?2) Patient assistance: ?- None pending ? ?3)  Education  ?- To be completed prior  to discharge ? ?Kerby Nora, PharmD, BCPS ?Heart Failure Stewardship Pharmacist ?Phone [phone] ? ? ?

## 2022-01-07 NOTE — Progress Notes (Addendum)
? ?Progress Note ? ?Patient Name: Catherine [name] ?Date of Encounter: 01/07/2022 ? ?[facility] Cardiologist: [name], MD  ? ?Subjective  ? ?Resting. ? ?Inpatient Medications  ?  ?Scheduled Meds: ? arformoterol  15 mcg Nebulization BID  ? budesonide (PULMICORT) nebulizer solution  0.5 mg Nebulization BID  ? doxycycline  100 mg Oral Q12H  ? feeding supplement  237 mL Oral BID BM  ? furosemide  40 mg Intravenous Daily  ? guaiFENesin  600 mg Oral BID  ? ipratropium-albuterol  3 mL Nebulization TID  ? mouth rinse  15 mL Mouth Rinse BID  ? methylPREDNISolone (SOLU-MEDROL) injection  80 mg Intravenous Daily  ? metoprolol succinate  37.5 mg Oral Daily  ? mometasone-formoterol  2 puff Inhalation BID  ? multivitamin with minerals  1 tablet Oral Daily  ? [START ON 01/08/2022] potassium chloride  40 mEq Oral Daily  ? rosuvastatin  5 mg Oral Once per day on Sun Tue Thu Sat  ? sodium chloride flush  3 mL Intravenous Q12H  ? Warfarin - Pharmacist Dosing Inpatient   Does not apply C5852  ? ?Continuous Infusions: ? sodium chloride    ? cefTRIAXone (ROCEPHIN)  IV Stopped (01/06/22 1819)  ? ?PRN Meds: ?sodium chloride, acetaminophen, albuterol, diazepam, guaiFENesin-dextromethorphan, hydrOXYzine, ondansetron (ZOFRAN) IV, sodium chloride flush  ? ?Vital Signs  ?  ?Vitals:  ? 01/07/22 0400 01/07/22 0724 01/07/22 0843 01/07/22 1131  ?BP: (!) 126/53 (!) 143/63  (!) 142/76  ?Pulse: 67 70 100 61  ?Resp: '16 13  18  '$ ?Temp: 97.7 ?F (36.5 ?C) 97.7 ?F (36.5 ?C)  97.8 ?F (36.6 ?C)  ?TempSrc: Oral Oral  Oral  ?SpO2: 96% 99%  97%  ?Weight: 69.4 kg     ?Height:      ? ? ?Intake/Output Summary (Last 24 hours) at 01/07/2022 1218 ?Last data filed at 01/07/2022 1100 ?Gross per 24 hour  ?Intake 460 ml  ?Output 1750 ml  ?Net -1290 ml  ? ? ?  01/07/2022  ?  4:00 AM 01/06/2022  ?  4:00 AM 01/05/2022  ?  8:50 AM  ?Last 3 Weights  ?Weight (lbs) 153 lb 151 lb 14.4 oz 150 lb 12.7 oz  ?Weight (kg) 69.4 kg 68.9 kg 68.4 kg  ?   ? ?Telemetry  ?  ?Atrial fib  with HR to 30s and to 140s - Personally Reviewed ? ?ECG  ?  ? No new - Personally Reviewed ? ?Physical Exam  ?Per Dr. Claiborne Billings  ?GEN: No acute distress.   ?Neck: No JVD ?Cardiac: irreg irreg, no murmurs, rubs, or gallops.  ?Respiratory: Clear to auscultation bilaterally. ?GI: Soft, nontender, non-distended  ?MS: No edema; No deformity. ?Neuro:  Nonfocal  ?Psych: Normal affect  ? ?Labs  ?  ?High Sensitivity Troponin:   ?Recent Labs  ?Lab 01/05/22 ?0116 01/05/22 ?0313  ?TROPONINIHS 10 10  ?   ?Chemistry ?Recent Labs  ?Lab 01/05/22 ?0116 01/05/22 ?1041 01/06/22 ?7782 01/07/22 ?0022  ?NA 139  --  140 140  ?K 3.3*  --  4.1 4.5  ?CL 103  --  106 107  ?CO2 24  --  28 27  ?GLUCOSE 128*  --  158* 200*  ?BUN 21  --  13 22  ?CREATININE 0.97  --  1.05* 0.92  ?CALCIUM 9.4  --  8.9 9.0  ?MG  --  1.9  --   --   ?PROT 7.1  --   --   --   ?ALBUMIN 4.1  --   --   --   ?  AST 19  --   --   --   ?ALT 21  --   --   --   ?ALKPHOS 87  --   --   --   ?BILITOT 0.2*  --   --   --   ?GFRNONAA 58*  --  52* >60  ?ANIONGAP 12  --  6 6  ?  ?Lipids No results for input(s): CHOL, TRIG, HDL, LABVLDL, LDLCALC, CHOLHDL in the last 168 hours.  ?Hematology ?Recent Labs  ?Lab 01/05/22 ?0116 01/06/22 ?2725 01/07/22 ?0022  ?WBC 6.7 8.4 8.1  ?RBC 4.12 3.77* 3.72*  ?HGB 12.6 11.4* 11.5*  ?HCT 38.5 35.6* 35.5*  ?MCV 93.4 94.4 95.4  ?MCH 30.6 30.2 30.9  ?MCHC 32.7 32.0 32.4  ?RDW 13.2 13.3 13.4  ?PLT 128* 130* 133*  ? ?Thyroid  ?Recent Labs  ?Lab 01/05/22 ?1041  ?TSH 1.716  ?  ?BNP ?Recent Labs  ?Lab 01/05/22 ?0116  ?BNP 184.1*  ?  ?DDimer No results for input(s): DDIMER in the last 168 hours.  ? ?Radiology  ?  ?No results found. ? ?Cardiac Studies  ? ?10/30/21 echo ?IMPRESSIONS  ? ? ? 1. Left ventricular ejection fraction, by estimation, is 60 to 65%. The  ?left ventricle has normal function. The left ventricle has no regional  ?wall motion abnormalities. Left ventricular diastolic parameters are  ?indeterminate.  ? 2. Right ventricular systolic function is mildly  reduced. The right  ?ventricular size is mildly enlarged. There is moderately elevated  ?pulmonary artery systolic pressure.  ? 3. Left atrial size was moderately dilated.  ? 4. Right atrial size was moderately dilated.  ? 5. Normal appearing bioprosthetic MVR no PVL low mean gradient at HR 58  ?bpm Stent struts protrude into LVOT but no obvious sub valvular gradients  ?Marland Kitchen The mitral valve has been repaired/replaced. No evidence of mitral valve  ?regurgitation. No evidence of  ?mitral stenosis.  ? 6. Tricuspid valve regurgitation is moderate.  ? 7. The aortic valve is tricuspid. There is moderate calcification of the  ?aortic valve. There is moderate thickening of the aortic valve. Aortic  ?valve regurgitation is not visualized. Mild aortic valve stenosis.  ? 8. The inferior vena cava is normal in size with greater than 50%  ?respiratory variability, suggesting right atrial pressure of 3 mmHg.  ? ?FINDINGS  ? Left Ventricle: Left ventricular ejection fraction, by estimation, is 60  ?to 65%. The left ventricle has normal function. The left ventricle has no  ?regional wall motion abnormalities. The left ventricular internal cavity  ?size was normal in size. There is  ? no left ventricular hypertrophy. Left ventricular diastolic parameters  ?are indeterminate.  ? ?Right Ventricle: The right ventricular size is mildly enlarged. No  ?increase in right ventricular wall thickness. Right ventricular systolic  ?function is mildly reduced. There is moderately elevated pulmonary artery  ?systolic pressure. The tricuspid  ?regurgitant velocity is 3.29 m/s, and with an assumed right atrial  ?pressure of 15 mmHg, the estimated right ventricular systolic pressure is  ?36.6 mmHg.  ? ?Left Atrium: Left atrial size was moderately dilated.  ? ?Right Atrium: Right atrial size was moderately dilated.  ? ?Pericardium: There is no evidence of pericardial effusion.  ? ?Mitral Valve: Normal appearing bioprosthetic MVR no PVL low mean  gradient  ?at HR 58 bpm Stent struts protrude into LVOT but no obvious sub valvular  ?gradients. The mitral valve has been repaired/replaced. No evidence of  ?mitral valve regurgitation. No  ?  evidence of mitral valve stenosis. MV peak gradient, 15.4 mmHg. The mean  ?mitral valve gradient is 4.0 mmHg.  ? ?Tricuspid Valve: The tricuspid valve is normal in structure. Tricuspid  ?valve regurgitation is moderate . No evidence of tricuspid stenosis.  ? ?Aortic Valve: The aortic valve is tricuspid. There is moderate  ?calcification of the aortic valve. There is moderate thickening of the  ?aortic valve. Aortic valve regurgitation is not visualized. Mild aortic  ?stenosis is present. Aortic valve mean gradient  ?measures 13.0 mmHg. Aortic valve peak gradient measures 24.2 mmHg.  ? ?Pulmonic Valve: The pulmonic valve was normal in structure. Pulmonic valve  ?regurgitation is mild. No evidence of pulmonic stenosis.  ? ?Aorta: The aortic root is normal in size and structure.  ? ? ?Patient Profile  ?   ?87 y.o. 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   ? ?Assessment & Plan  ? Acute hypoxic respiratory in the setting of COPD/HFpEF: reports she has not felt well since being discharged back in February. Suspect etiology is multifactorial, though likely that her CHF is contributing. BNP elevated and CXR with  small left pleural effusion.  ?-- given IV lasix '60mg'$  x2 on admission, net -1L. Ordered for additional lasix ?--neg 2,367 wt wt without change. ?-- continue home nebs and steroids  ?  ?Paroxysmal afib/junctional rhythm: she was documented to be in SR at last office visit. Now appears in afib with episode of RVR on telemetry. Suspect this may be contributing to her HF. Has been on Toprol '25mg'$  daily PTA. May need to consider addition AAD? ?-- on Toprol '25mg'$  daily, will further  increase to 37.'5mg'$  daily has been held at times due to HR to 30s. On occ.  ?-- continue coumadin per pharmacy, INR 3.0 on admission  ?  ?S/p bioprosthetic MVR/CABG x1/maze: no chest pain PTA, stable MV on echo 2/2

## 2022-01-07 NOTE — Progress Notes (Signed)
Paged NP Dorene Ar  at [phone] to inform cardiology of patients fluctuating HR with it ranging anywhere from 60-140 within seconds of each other. Patient is asymptomatic, will continue to monitor. ?

## 2022-01-07 NOTE — TOC Progression Note (Signed)
Transition of Care (TOC) - Progression Note  ? ? ?Patient Details  ?Name: Catherine [name] ?MRN: [id number] ?Date of Birth: June 03, 1935 ? ?Transition of Care (TOC) CM/SW Contact  ?Angelita Ingles, RN ?Phone Number:[phone] ? ?01/07/2022, 2:43 PM ? ?Clinical Narrative:    ? ?Transition of Care (TOC) Screening Note ? ? ?Patient Details  ?Name: Catherine [name] ?Date of Birth: 12-12-1934 ? ? ?Transition of Care (TOC) CM/SW Contact:    ?Angelita Ingles, RN ?Phone Number: ?01/07/2022, 2:43 PM ? ? ? ?Transition of Care Department [facility]) has reviewed patient and no TOC needs have been identified at this time. We will continue to monitor patient advancement through interdisciplinary progression rounds.  ? ? ? ? ?  ?  ? ?Expected Discharge Plan and Services ?  ?  ?  ?  ?  ?                ?  ?  ?  ?  ?  ?  ?  ?  ?  ?  ? ? ?Social Determinants of Health (SDOH) Interventions ?  ? ?Readmission Risk Interventions ?   ? View : No data to display.  ?  ?  ?  ? ? ?

## 2022-01-08 DIAGNOSIS — J9601 Acute respiratory failure with hypoxia: Secondary | ICD-10-CM | POA: Diagnosis not present

## 2022-01-08 DIAGNOSIS — I4819 Other persistent atrial fibrillation: Secondary | ICD-10-CM | POA: Diagnosis not present

## 2022-01-08 DIAGNOSIS — I2510 Atherosclerotic heart disease of native coronary artery without angina pectoris: Secondary | ICD-10-CM | POA: Diagnosis not present

## 2022-01-08 DIAGNOSIS — N1831 Chronic kidney disease, stage 3a: Secondary | ICD-10-CM

## 2022-01-08 DIAGNOSIS — N189 Chronic kidney disease, unspecified: Secondary | ICD-10-CM | POA: Diagnosis present

## 2022-01-08 DIAGNOSIS — I4811 Longstanding persistent atrial fibrillation: Secondary | ICD-10-CM

## 2022-01-08 DIAGNOSIS — F419 Anxiety disorder, unspecified: Secondary | ICD-10-CM

## 2022-01-08 DIAGNOSIS — I10 Essential (primary) hypertension: Secondary | ICD-10-CM | POA: Diagnosis present

## 2022-01-08 LAB — CBC
HCT: 33.1 % — ABNORMAL LOW (ref 36.0–46.0)
Hemoglobin: 11.4 g/dL — ABNORMAL LOW (ref 12.0–15.0)
MCH: 34.4 pg — ABNORMAL HIGH (ref 26.0–34.0)
MCHC: 34.4 g/dL (ref 30.0–36.0)
MCV: 100 fL (ref 80.0–100.0)
Platelets: 144 10*3/uL — ABNORMAL LOW (ref 150–400)
RBC: 3.31 MIL/uL — ABNORMAL LOW (ref 3.87–5.11)
RDW: 14.3 % (ref 11.5–15.5)
WBC: 8.3 10*3/uL (ref 4.0–10.5)
nRBC: 0 % (ref 0.0–0.2)

## 2022-01-08 LAB — BASIC METABOLIC PANEL
Anion gap: 6 (ref 5–15)
BUN: 25 mg/dL — ABNORMAL HIGH (ref 8–23)
CO2: 25 mmol/L (ref 22–32)
Calcium: 8.9 mg/dL (ref 8.9–10.3)
Chloride: 105 mmol/L (ref 98–111)
Creatinine, Ser: 0.8 mg/dL (ref 0.44–1.00)
GFR, Estimated: >60 mL/min (ref >60)
Glucose, Bld: 131 mg/dL — ABNORMAL HIGH (ref 70–99)
Potassium: 4.5 mmol/L (ref 3.5–5.1)
Sodium: 136 mmol/L (ref 135–145)

## 2022-01-08 LAB — PROTIME-INR
INR: 2.3 — ABNORMAL HIGH (ref 0.8–1.2)
Prothrombin Time: 25.3 seconds — ABNORMAL HIGH (ref 11.4–15.2)

## 2022-01-08 MED ORDER — BISACODYL 5 MG PO TBEC
5.0000 mg | DELAYED_RELEASE_TABLET | Freq: Once | ORAL | Status: AC
  Administered 2022-01-08: 5 mg via ORAL
  Filled 2022-01-08: qty 1

## 2022-01-08 MED ORDER — WARFARIN SODIUM 2 MG PO TABS
2.0000 mg | ORAL_TABLET | Freq: Once | ORAL | Status: AC
  Administered 2022-01-08: 2 mg via ORAL
  Filled 2022-01-08: qty 1

## 2022-01-08 MED ORDER — DIAZEPAM 5 MG PO TABS
5.0000 mg | ORAL_TABLET | Freq: Every day | ORAL | Status: DC
  Administered 2022-01-08 – 2022-01-11 (×4): 5 mg via ORAL
  Filled 2022-01-08 (×4): qty 1

## 2022-01-08 MED ORDER — IPRATROPIUM-ALBUTEROL 0.5-2.5 (3) MG/3ML IN SOLN
3.0000 mL | Freq: Four times a day (QID) | RESPIRATORY_TRACT | Status: DC
Start: 2022-01-08 — End: 2022-01-09
  Administered 2022-01-08 – 2022-01-09 (×4): 3 mL via RESPIRATORY_TRACT
  Filled 2022-01-08 (×4): qty 3

## 2022-01-08 MED ORDER — METHYLPREDNISOLONE SODIUM SUCC 40 MG IJ SOLR
40.0000 mg | Freq: Two times a day (BID) | INTRAMUSCULAR | Status: DC
  Administered 2022-01-08 – 2022-01-10 (×4): 40 mg via INTRAVENOUS
  Filled 2022-01-08 (×4): qty 1

## 2022-01-08 MED ORDER — ALPRAZOLAM 0.5 MG PO TABS: 0.5000 mg | ORAL_TABLET | Freq: Three times a day (TID) | ORAL | Status: DC | PRN

## 2022-01-08 MED ORDER — POLYETHYLENE GLYCOL 3350 17 G PO PACK
17.0000 g | PACK | Freq: Two times a day (BID) | ORAL | Status: DC
  Administered 2022-01-08: 17 g via ORAL
  Filled 2022-01-08 (×3): qty 1

## 2022-01-08 NOTE — Evaluation (Signed)
Occupational Therapy Evaluation ?Patient Details ?Name: Catherine [name] ?MRN: [id number] ?DOB: March 18, 1935 ?Today's Date: 01/08/2022 ? ? ?History of Present Illness Pt is a 87 y.o. F who presents 01/05/2022 with complaints of progressively worsening shortness of breath over the last 4 days. Admitted with acute respiratory failure with hypoxia secondary to acute on chronic COPD and diastolic congestive heart failure exacerbation. Significant PMH: chronic diastolic CHF, moderate triscuspid regurg, CAD s/p CABG, rheumatic heart disease, chronic atrial fibrillation on Coumadin, CKD stage III.  ? ?Clinical Impression ?  ?Prior to this admission, patient living with her spouse and was independent in ADLs and IADLs. Patient endorsing she still drives, manages her medications without issue, and still cooks and cleans the house. Patient received in the middle of a bathing ADL, where patient was able to complete in standing, needing min A for lower body ADLs. Patient able to ambulate around room without O2 (above 94% throughout session) and able to navigate around hospital room reaching out minimally for furniture and surfaces. O2 left off at end of session due to good pleth line and saturations throughout session with RN notified. No OT recommended at discharge, however OT will continue to follow acutely to address deficits outlined below.  ?   ? ?Recommendations for follow up therapy are one component of a multi-disciplinary discharge planning process, led by the attending physician.  Recommendations may be updated based on patient status, additional functional criteria and insurance authorization.  ? ?Follow Up Recommendations ? No OT follow up  ?  ?Assistance Recommended at Discharge PRN  ?Patient can return home with the following Assistance with cooking/housework;Assist for transportation;Help with stairs or ramp for entrance ? ?  ?Functional Status Assessment ? Patient has had a recent decline in their functional status and  demonstrates the ability to make significant improvements in function in a reasonable and predictable amount of time.  ?Equipment Recommendations ? None recommended by OT  ?  ?Recommendations for Other Services   ? ? ?  ?Precautions / Restrictions Precautions ?Precautions: Other (comment) ?Precaution Comments: watch O2 and HR ?Restrictions ?Weight Bearing Restrictions: No  ? ?  ? ?Mobility Bed Mobility ?  ?  ?  ?  ?  ?  ?  ?General bed mobility comments: standing with RN completing bathing upon arrival ?  ? ?Transfers ?Overall transfer level: Independent ?Equipment used: None ?  ?  ?  ?  ?  ?  ?  ?General transfer comment: able to complete multiple transfers independently during grooming and ADLs ?  ? ?  ?Balance Overall balance assessment: Needs assistance ?Sitting-balance support: Feet supported ?Sitting balance-Leahy Scale: Good ?  ?  ?Standing balance support: Single extremity supported, No upper extremity supported, During functional activity ?Standing balance-Leahy Scale: Fair ?Standing balance comment: minor LOB when attempting lower body dressing in standing ?  ?  ?  ?  ?  ?  ?  ?  ?  ?  ?  ?   ? ?ADL either performed or assessed with clinical judgement  ? ?ADL Overall ADL's : Needs assistance/impaired ?Eating/Feeding: Set up;Sitting ?  ?Grooming: Wash/dry hands;Wash/dry face;Oral care;Applying deodorant;Brushing hair;Set up;Standing ?  ?Upper Body Bathing: Set up;Standing ?  ?Lower Body Bathing: Min guard;Sit to/from stand ?  ?Upper Body Dressing : Set up;Standing ?  ?Lower Body Dressing: Minimal assistance;Sit to/from stand;Sitting/lateral leans ?Lower Body Dressing Details (indicate cue type and reason): minor LOB doffing underwear, sitting on top of BSC to don, min A to thread ?Toilet Transfer: Min  guard;Ambulation ?  ?Toileting- Water quality scientist and Hygiene: Min guard;Sit to/from stand;Sitting/lateral lean ?  ?  ?  ?Functional mobility during ADLs: Min guard;Cueing for safety ?General ADL Comments:  Patient presenting with decreased activity tolerance, minor balance issues, and need for increased time when completing ADLs  ? ? ? ?Vision Baseline Vision/History: 0 No visual deficits ?Ability to See in Adequate Light: 0 Adequate ?Patient Visual Report: No change from baseline ?   ?   ?Perception   ?  ?Praxis   ?  ? ?Pertinent Vitals/Pain Pain Assessment ?Pain Assessment: No/denies pain  ? ? ? ?Hand Dominance   ?  ?Extremity/Trunk Assessment Upper Extremity Assessment ?Upper Extremity Assessment: Overall WFL for tasks assessed ?  ?Lower Extremity Assessment ?Lower Extremity Assessment: Defer to PT evaluation ?  ?Cervical / Trunk Assessment ?Cervical / Trunk Assessment: Normal ?  ?Communication Communication ?Communication: No difficulties ?  ?Cognition Arousal/Alertness: Awake/alert ?Behavior During Therapy: [facility] for tasks assessed/performed ?Overall Cognitive Status: Within Functional Limits for tasks assessed ?  ?  ?  ?  ?  ?  ?  ?  ?  ?  ?  ?  ?  ?  ?  ?  ?General Comments: very pleasant and motivated ?  ?  ?General Comments  VSS on RA, above 94% when completing functional tasks, left of oxygen at end of session with RN notified ? ?  ?Exercises   ?  ?Shoulder Instructions    ? ? ?Home Living Family/patient expects to be discharged to:: Private residence ?Living Arrangements: Spouse/significant other ?Available Help at Discharge: Family ?Type of Home: House ?Home Access: Stairs to enter ?Entrance Stairs-Number of Steps: 3 ?Entrance Stairs-Rails: Right;Left ?Home Layout: One level ?  ?  ?Bathroom Shower/Tub: Walk-in shower ?  ?Bathroom Toilet: Standard ?  ?  ?Home Equipment: Conservation officer, nature (2 wheels);Shower seat;BSC/3in1 ?  ?  ?  ? ?  ?Prior Functioning/Environment Prior Level of Function : Independent/Modified Independent;Driving ?  ?  ?  ?  ?  ?  ?Mobility Comments: Does Silver Sneakers ?ADLs Comments: Independent, still drives, grocery shops, was a Emergency planning/management officer for 52 years ?  ? ?  ?  ?OT Problem List:  Decreased activity tolerance;Impaired balance (sitting and/or standing);Decreased coordination;Decreased safety awareness ?  ?   ?OT Treatment/Interventions: Self-care/ADL training;Therapeutic exercise;Energy conservation;DME and/or AE instruction;Therapeutic activities;Patient/family education;Balance training  ?  ?OT Goals(Current goals can be found in the care plan section) Acute Rehab OT Goals ?Patient Stated Goal: to get back home ?OT Goal Formulation: With patient ?Time For Goal Achievement: 01/22/22 ?Potential to Achieve Goals: Good ?ADL Goals ?Pt Will Transfer to Toilet: Independently;ambulating ?Pt Will Perform Toileting - Clothing Manipulation and hygiene: Independently;sitting/lateral leans;sit to/from stand ?Additional ADL Goal #1: Patient will be able to verbalize 3 strategies for energy conservation in order for safe discharge home. ?Additional ADL Goal #2: Patient will be able to retrieve items for ADLs and complete ADL in standing for 5-7 minutes without LOB or need for seated rest break.  ?OT Frequency: Min 2X/week ?  ? ?Co-evaluation   ?  ?  ?  ?  ? ?  ?AM-PAC OT "6 Clicks" Daily Activity     ?Outcome Measure Help from another person eating meals?: A Little ?Help from another person taking care of personal grooming?: A Little ?Help from another person toileting, which includes using toliet, bedpan, or urinal?: A Little ?Help from another person bathing (including washing, rinsing, drying)?: A Little ?Help from another person to put on and  taking off regular upper body clothing?: A Little ?Help from another person to put on and taking off regular lower body clothing?: A Little ?6 Click Score: 18 ?  ?End of Session Nurse Communication: Mobility status;Other (comment) (off of o2 at end of session) ? ?Activity Tolerance: Patient tolerated treatment well ?Patient left: in chair;with call bell/phone within reach ? ?OT Visit Diagnosis: Unsteadiness on feet (R26.81)  ?              ?Time: 1610-9604 ?OT  Time Calculation (min): 25 min ?Charges:  OT General Charges ?$OT Visit: 1 Visit ?OT Evaluation ?$OT Eval Moderate Complexity: 1 Mod ?OT Treatments ?$Self Care/Home Management : 8-22 mins ? ?Corinne Ports E. Junetta Hearn

## 2022-01-08 NOTE — Assessment & Plan Note (Addendum)
Patient continue to have dyspnea, her oxygenation is 97% on 2 L.min per [place] ?Her volume status has improved, urine output over last 24 hrs is 1,900  ? ?Plan to continue diuresis with furosemide, to keep negative fluid balance.  ? ?COPD exacerbation.  ?Patient with positive wheezing and rhonchi, prolonged expiratory phase.  ? ?Plan to continue corticosteroid therapy with methylprednisolone 40 mg IV q12 hrs ?Aggressive broncho dilator therapy with duoneb, scheduled and as needed.  ?Inhaled corticosteroids and airway clearing techniques with flutter valve and incentive spirometer.  ?No clinical signs of pneumonia, will dc antibiotic therapy.  ? ?Continue to encourage mobility, PT, OT, and out of bed to chair tid with meals. ?Consult nutrition.  ?

## 2022-01-08 NOTE — Progress Notes (Signed)
PT session comments: Pt received in recliner, agreeable to therapy session and with good participation and tolerance for gait and stair training. Pt with noted Afib but HR improved with standing breaks, SpO2 WFL. Pt with multiple mild LOB requiring minA to correct balance while performing Dynamic Gait Index tasks and scored 11/24 on DGI. Scores of 19 and fewer indicate increased fall risk for community dwelling older adults. Pt continues to benefit from PT services to progress toward functional mobility goals. Continue to recommend pt utilize assistive device for longer gait tasks due to increased LOB with longer gait distances and when stepping over obstacles. ? 01/08/22 1700  ?PT Visit Information  ?Last PT Received On 01/08/22  ?Assistance Needed +1  ?History of Present Illness Pt is a 87 y.o. F who presents 01/05/2022 with complaints of progressively worsening shortness of breath over the last 4 days. Admitted with acute respiratory failure with hypoxia secondary to acute on chronic COPD and diastolic congestive heart failure exacerbation. Significant PMH: chronic diastolic CHF, moderate triscuspid regurg, CAD s/p CABG, rheumatic heart disease, chronic atrial fibrillation on Coumadin, CKD stage III.  ?Subjective Data  ?Patient Stated Goal return to prior level of activity  ?Precautions  ?Precautions Fall  ?Precaution Comments watch O2 and HR  ?Restrictions  ?Weight Bearing Restrictions No  ?Pain Assessment  ?Pain Assessment No/denies pain  ?Cognition  ?Arousal/Alertness Awake/alert  ?Behavior During Therapy [facility] for tasks assessed/performed  ?Overall Cognitive Status Within Functional Limits for tasks assessed  ?General Comments very pleasant and motivated, noted cognitive deficits  ?Bed Mobility  ?General bed mobility comments received in recliner  ?Transfers  ?Overall transfer level Modified independent  ?Equipment used None  ?General transfer comment from chair  ?Ambulation/Gait  ?Ambulation/Gait assistance Min  assist  ?Gait Distance (Feet) 110 Feet  ?Assistive device None  ?Gait Pattern/deviations Step-through pattern;Decreased stride length  ?General Gait Details Pt with three lateral LOB, but able to self correct. Otherwise, slow and steady pace - cues for activity pacing. LOB with balance challenges on DGI and noted scissoring stepping over obstacle  ?Stairs Yes  ?Stairs assistance Min guard  ?Stair Management One rail Left  ?Number of Stairs 8 ?(pt stepped up and down from 1 step 4x)  ?General stair comments cues for step-to pattern and min guard steadying with U UE support per home setup  ?Balance  ?Overall balance assessment Needs assistance  ?Sitting-balance support Feet supported;No upper extremity supported  ?Sitting balance-Leahy Scale Good  ?Standing balance support No upper extremity supported;During functional activity  ?Standing balance-Leahy Scale Fair  ?Standing balance comment minor LOB when stepping over box  ?Standardized Balance Assessment  ?Standardized Balance Assessment  Dynamic Gait Index  ?Dynamic Gait Index  ?Level Surface 2  ?Change in Gait Speed 1  ?Gait with Horizontal Head Turns 1  ?Gait with Vertical Head Turns 1  ?Gait and Pivot Turn 2  ?Step Over Obstacle 1  ?Step Around Obstacles 2  ?Steps 1  ?Total Score 11  ?General Comments  ?General comments (skin integrity, edema, etc.) Tachy to 133 bpm with exertion, pt HR decreased with standing breaks; SpO2 WFL throughout  ?Exercises  ?Exercises General Lower Extremity  ?General Exercises - Lower Extremity  ?Hip Flexion/Marching AROM;Strengthening;Both;5 reps;Seated  ?Ankle Circles/Pumps AROM;Both;10 reps;Supine;Strengthening  ?Heel Slides AROM;Both;5 reps;Seated;Strengthening  ?Straight Leg Raises AROM;Strengthening;Both;10 reps;Seated  ?Long Arc Quad AROM;Both;10 reps;Seated;Strengthening  ?PT - End of Session  ?Equipment Utilized During Treatment Gait belt  ?Activity Tolerance Patient tolerated treatment well  ?Patient left in chair;with  call  bell/phone within reach  ?Nurse Communication Mobility status  ? PT - Assessment/Plan  ?PT Plan Current plan remains appropriate  ?PT Visit Diagnosis Unsteadiness on feet (R26.81);Difficulty in walking, not elsewhere classified (R26.2)  ?PT Frequency (ACUTE ONLY) Min 3X/week  ?Follow Up Recommendations No PT follow up  ?Assistance recommended at discharge PRN  ?Patient can return home with the following Assistance with cooking/housework;Help with stairs or ramp for entrance  ?PT equipment None recommended by PT  ?AM-PAC PT "6 Clicks" Mobility Outcome Measure (Version 2)  ?Help needed turning from your back to your side while in a flat bed without using bedrails? 4  ?Help needed moving from lying on your back to sitting on the side of a flat bed without using bedrails? 4  ?Help needed moving to and from a bed to a chair (including a wheelchair)? 4  ?Help needed standing up from a chair using your arms (e.g., wheelchair or bedside chair)? 4  ?Help needed to walk in hospital room? 3  ?Help needed climbing 3-5 steps with a railing?  3  ?6 Click Score 22  ?Consider Recommendation of Discharge To: Home with no services  ?Progressive Mobility  ?What is the highest level of mobility based on the progressive mobility assessment? Level 5 (Walks with assist in room/hall) - Balance while stepping forward/back and can walk in room with assist - Complete  ?Activity Ambulated with assistance in hallway  ?PT Goal Progression  ?Progress towards PT goals Progressing toward goals  ?Acute Rehab PT Goals  ?PT Goal Formulation With patient  ?Time For Goal Achievement 01/20/22  ?PT Time Calculation  ?PT Start Time (ACUTE ONLY) 1720  ?PT Stop Time (ACUTE ONLY) 1743  ?PT Time Calculation (min) (ACUTE ONLY) 23 min  ?PT General Charges  ?$$ ACUTE PT VISIT 1 Visit  ?PT Treatments  ?$Gait Training 8-22 mins  ?$Therapeutic Exercise 8-22 mins  ? ?

## 2022-01-08 NOTE — Assessment & Plan Note (Signed)
Transient hypotension.  ? ?Blood pressure 130 to 160 mmHg ?Continue diuresis with furosemide and rate control atrial fibrillation with metoprolol.  ?

## 2022-01-08 NOTE — Care Management Important Message (Signed)
Important Message ? ?Patient Details  ?Name: Catherine [name] ?MRN: [id number] ?Date of Birth: Feb 16, 1935 ? ? ?Medicare Important Message Given:  Yes ? ? ? ? ?[name] ?01/08/2022, 2:21 PM ?

## 2022-01-08 NOTE — Progress Notes (Signed)
Heart Failure Stewardship Pharmacist Progress Note ? ? ?PCP: [name], MD ?PCP-Cardiologist: [name], MD  ? ? ?HPI:  ?87 yo F with PMH of CHF, CAD s/p CABG, rheumatic heart disease, afib, history of frequent UTIs, and CKD. She presented to the ED on 4/11 with shortness of breath, LE edema, and orthopnea. CXR with possible mild interstitial edema. Her last ECHO was done 10/30/21 and LVEF was 60-65% with mildly reduced RV. ? ?Current HF Medications: ?Diuretic: furosemide 40 mg IV daily ?Beta Blocker: metoprolol XL 37.5 mg daily ? ?Prior to admission HF Medications: ?Diuretic: torsemide 40 mg BID ?Beta blocker: metoprolol XL 25 mg daily ? ?Pertinent Lab Values: ?Serum creatinine 0.80, BUN 25, Potassium 4.5, Sodium 136, BNP 184.1 ? ?Vital Signs: ?Weight: 152 lbs (admission weight: 150 lbs) ?Blood pressure: 160/60s  ?Heart rate: 60s  ?I/O: -1.7L yesterday; net -3L ? ?Medication Assistance / Insurance Benefits Check: ?Does the patient have prescription insurance?  Yes ?Type of insurance plan: [facility] Medicare ? ?Outpatient Pharmacy:  ?Prior to admission outpatient pharmacy: [facility] ?Is the patient willing to use [facility] pharmacy at discharge? Yes ?Is the patient willing to transition their outpatient pharmacy to utilize a [facility] outpatient pharmacy?   Pending ?  ? ?Assessment: ?1. Acute on chronic diastolic CHF (EF 68-34%). NYHA class III symptoms. ?- Continue furosemide 40 mg IV daily ?- Continue metoprolol XL 37.5 mg daily ?- Intolerance to benazepril - caused cough. Consider adding ARB ?- Intolerance to spironolactone - caused dyspnea ?- Caution SGLT2i with history of UTIs ?  ?Plan: ?1) Medication changes recommended at this time: ?- Add losartan 12.5 mg daily ? ?2) Patient assistance: ?- None pending ? ?3)  Education  ?- To be completed prior to discharge ? ?Kerby Nora, PharmD, BCPS ?Heart Failure Stewardship Pharmacist ?Phone [phone] ? ? ?

## 2022-01-08 NOTE — Consult Note (Signed)
? ?  [facility] CM Inpatient Consult ? ? ?01/08/2022 ? ?Catherine [name] ?[birth year] ?[id number] ? ?[facility] [ACO] Patient: [employer] ? ?Primary Care Provider:  [name], MD,  Primary Care at [facility] is an embedded provider with a Chronic Care Management team and program, and is listed for the transition of care follow up and appointments. ? ?Patient was screened for Embedded practice service needs for chronic care management and noted to be active in the CCM team with CCM RN and CCM Pharmacist. Met with the patient at the bedside.  Patient verbalizes ongoing follow up with Embedded PCP.  Expressed no additional needs.  Awaiting Dr. Erick Blinks return for her follow up appointment for labs. ? ?Plan: Will continue to follow for needs and update  Embedded CCM team for any changes for post hospital follow up care. ? ?Please contact for further questions, ? ?Natividad Brood, RN BSN CCM ?[facility] Liaison ? [phone] business mobile phone ?Toll free office [phone]  ?Fax number: [phone] ?[email or web address] ? ? ? ?

## 2022-01-08 NOTE — Assessment & Plan Note (Signed)
CKD stage 3a hypokalemia ? ?Renal function with serum cr at 0,80 with K at 4,5 and serum bicarbonate at 25. ?Continue close follow up on renal function and electrolytes, continue diuresis with furosemide.  ?

## 2022-01-08 NOTE — Progress Notes (Addendum)
?Progress Note ? ? ?Patient: Catherine [name] YQM:[id number] DOB: Apr 09, 1935 DOA: 01/05/2022     3 ?DOS: the patient was seen and examined on 01/08/2022 ?  ?Brief hospital course: ?Catherine [name] was admitted to the hospital with the working diagnosis of decompensated heart failure and COPD exacerbation with acute hypoxemic respiratory failure.  ? ?87 year old with history of chronic diastolic CHF on torsemide, A-fib on Coumadin, COPD, CAD, s/p bioprosthetic MVR/CABG x1/maze, hyperlipidemia presenting with progressively worsening dyspnea on exertion and peripheral edema.  5 lbs weight gain. On her initial physical examination her blood pressure was 122/46, HR 67 RR 23 an 02 saturation 92%, lungs with decreased ventilation, expiratory wheezing, heart with S1 and S2 present and rhythmic, no gallops or murmurs, trace lower extremity edema.  ? ?Na 139, K 3,3 Cl 103, bicarbonate 24 glucose 128 bun 21 cr 0,97  ?BNP 184  ?High sensitive troponin 10 and 10  ?Wbc 6.7 hgb 12,6 plt 128  ?INR 3.0  ? ?Chest radiograph with cardiomegaly, bilateral increased interstitial markings.  ? ?EKG 67 bpm, normal axis, normal qtc, junctional rhythm, q waves in V1 and V2, no significant ST segment or T wave changes.  ? ?She was given IV Lasix 60 mg.  O2 saturations as low as 88 to 87% placed on 3 L oxygen for comfort. ?Cardiology was consulted for acute hypoxic aspiratory failure in the setting of COPD and acute on chronic diastolic CHF, also with paroxysmal A-fib/junctional rhythm. ? ?Assessment and Plan: ?* Acute respiratory failure with hypoxia  secondary to acute on chronic COPD and diastolic congestive heart failure exacerbation ?Patient continue to have dyspnea, her oxygenation is 97% on 2 L.min per [place] ?Her volume status has improved, urine output over last 24 hrs is 1,900  ? ?Plan to continue diuresis with furosemide, to keep negative fluid balance.  ? ?COPD exacerbation.  ?Patient with positive wheezing and rhonchi, prolonged expiratory  phase.  ? ?Plan to continue corticosteroid therapy with methylprednisolone 40 mg IV q12 hrs ?Aggressive broncho dilator therapy with duoneb, scheduled and as needed.  ?Inhaled corticosteroids and airway clearing techniques with flutter valve and incentive spirometer.  ?No clinical signs of pneumonia, will dc antibiotic therapy.  ? ?Continue to encourage mobility, PT, OT, and out of bed to chair tid with meals. ?Consult nutrition.  ? ?Atrial fibrillation ([place]) ?Continue heart rate control with metoprolol and anticoagulation with warfarin.  ?INR is 2.3  ? ?Hypertension ?Transient hypotension.  ? ?Blood pressure 130 to 160 mmHg ?Continue diuresis with furosemide and rate control atrial fibrillation with metoprolol.  ? ?Chronic kidney disease ?CKD stage 3a hypokalemia ? ?Renal function with serum cr at 0,80 with K at 4,5 and serum bicarbonate at 25. ?Continue close follow up on renal function and electrolytes, continue diuresis with furosemide.  ? ?Coronary artery disease ?No signs of acute coronary syndrome. ?Plan to continue aspirin and statin. ? ?Dyslipidemia, continue with rosuvastatin.   ? ?Anxiety ?Continue with diazepam at night and during the day will add as needed alprazolam.  ? ? ? ? ?  ? ?Subjective: Patient continue to have dyspnea, she has not been able to sleep due to anxiety and positive anxiety during the day. Patient has been constipated, no bowel movement for the last 5 days.  ? ?Physical Exam: ?Vitals:  ? 01/07/22 2021 01/07/22 2257 01/08/22 5284 01/08/22 1324  ?BP: (!) 156/64 (!) 159/64 (!) 161/66 (!) 136/59  ?Pulse: 99 64 61 70  ?Resp: '20 16 16 18  '$ ?Temp: 97.6 ?F (  36.4 ?C) 97.6 ?F (36.4 ?C) 97.7 ?F (36.5 ?C) 97.7 ?F (36.5 ?C)  ?TempSrc: Oral Oral Oral Oral  ?SpO2: 96% 97% 97% 97%  ?Weight:   69.3 kg   ?Height:      ? ?Neurology awake and alert ?ENT with no pallor ?Cardiovascular with S1 and S2 present, irregularly irregular with no gallops, rubs or murmurs ?Respiratory with prolonged expiratory  phase, bilateral rhonchi and wheezing.  ?Abdomen not distended ?Trace lower extremity edema, compression stockings in place  ?Data Reviewed: ? ? ? ?Family Communication: no family at the bedside  ? ?Disposition: ?Status is: Inpatient ?Remains inpatient appropriate because: respiratory failure ? Planned Discharge Destination: Home ? ? ? ? ?Author: ?Tawni Millers, MD ?01/08/2022 11:44 AM ? ?For on call review [email or web address].  ?

## 2022-01-08 NOTE — Progress Notes (Addendum)
? ?Progress Note ? ?Patient Name: Catherine [name] ?Date of Encounter: 01/08/2022 ? ?[facility] Cardiologist: [name], MD  ? ?Subjective  ? ?No chest pain or SOB ? ?Inpatient Medications  ?  ?Scheduled Meds: ? arformoterol  15 mcg Nebulization BID  ? budesonide (PULMICORT) nebulizer solution  0.5 mg Nebulization BID  ? diazepam  5 mg Oral QHS  ? feeding supplement  237 mL Oral BID BM  ? furosemide  40 mg Intravenous Daily  ? guaiFENesin  600 mg Oral BID  ? ipratropium-albuterol  3 mL Nebulization Q6H  ? mouth rinse  15 mL Mouth Rinse BID  ? methylPREDNISolone (SOLU-MEDROL) injection  40 mg Intravenous Q12H  ? metoprolol succinate  37.5 mg Oral Daily  ? mometasone-formoterol  2 puff Inhalation BID  ? multivitamin with minerals  1 tablet Oral Daily  ? potassium chloride  40 mEq Oral Daily  ? rosuvastatin  5 mg Oral Once per day on Sun Tue Thu Sat  ? sodium chloride flush  3 mL Intravenous Q12H  ? warfarin  2 mg Oral ONCE-1600  ? Warfarin - Pharmacist Dosing Inpatient   Does not apply A2130  ? ?Continuous Infusions: ? sodium chloride    ? ?PRN Meds: ?sodium chloride, acetaminophen, albuterol, ALPRAZolam, guaiFENesin-dextromethorphan, ondansetron (ZOFRAN) IV, sodium chloride flush  ? ?Vital Signs  ?  ?Vitals:  ? 01/07/22 2021 01/07/22 2257 01/08/22 8657 01/08/22 8469  ?BP: (!) 156/64 (!) 159/64 (!) 161/66 (!) 136/59  ?Pulse: 99 64 61 70  ?Resp: '20 16 16 18  '$ ?Temp: 97.6 ?F (36.4 ?C) 97.6 ?F (36.4 ?C) 97.7 ?F (36.5 ?C) 97.7 ?F (36.5 ?C)  ?TempSrc: Oral Oral Oral Oral  ?SpO2: 96% 97% 97% 97%  ?Weight:   69.3 kg   ?Height:      ? ? ?Intake/Output Summary (Last 24 hours) at 01/08/2022 1156 ?Last data filed at 01/08/2022 1000 ?Gross per 24 hour  ?Intake 897 ml  ?Output 1300 ml  ?Net -403 ml  ? ? ?  01/08/2022  ?  6:39 AM 01/07/2022  ?  4:00 AM 01/06/2022  ?  4:00 AM  ?Last 3 Weights  ?Weight (lbs) 152 lb 12.5 oz 153 lb 151 lb 14.4 oz  ?Weight (kg) 69.3 kg 69.4 kg 68.9 kg  ?   ? ?Telemetry  ?  ?A fib with bursts of RVR -  Personally Reviewed ? ?ECG  ?  ?No new - Personally Reviewed ? ?Physical Exam  ? ?GEN: No acute distress.   ?Neck: No JVD ?Cardiac: irreg irreg, no murmurs, rubs, or gallops.  ?Respiratory: occ wheeze and rhonchi to auscultation bilaterally. ?GI: Soft, nontender, non-distended  ?MS: No edema; No deformity. ?Neuro:  Nonfocal  ?Psych: Normal affect  ? ?Labs  ?  ?High Sensitivity Troponin:   ?Recent Labs  ?Lab 01/05/22 ?0116 01/05/22 ?0313  ?TROPONINIHS 10 10  ?   ?Chemistry ?Recent Labs  ?Lab 01/05/22 ?0116 01/05/22 ?1041 01/06/22 ?6295 01/07/22 ?0022 01/08/22 ?0040  ?NA 139  --  140 140 136  ?K 3.3*  --  4.1 4.5 4.5  ?CL 103  --  106 107 105  ?CO2 24  --  '28 27 25  '$ ?GLUCOSE 128*  --  158* 200* 131*  ?BUN 21  --  13 22 25*  ?CREATININE 0.97  --  1.05* 0.92 0.80  ?CALCIUM 9.4  --  8.9 9.0 8.9  ?MG  --  1.9  --   --   --   ?PROT 7.1  --   --   --   --   ?  ALBUMIN 4.1  --   --   --   --   ?AST 19  --   --   --   --   ?ALT 21  --   --   --   --   ?ALKPHOS 87  --   --   --   --   ?BILITOT 0.2*  --   --   --   --   ?GFRNONAA 58*  --  52* >60 >60  ?ANIONGAP 12  --  '6 6 6  '$ ?  ?Lipids No results for input(s): CHOL, TRIG, HDL, LABVLDL, LDLCALC, CHOLHDL in the last 168 hours.  ?Hematology ?Recent Labs  ?Lab 01/06/22 ?9163 01/07/22 ?0022 01/08/22 ?0040  ?WBC 8.4 8.1 8.3  ?RBC 3.77* 3.72* 3.31*  ?HGB 11.4* 11.5* 11.4*  ?HCT 35.6* 35.5* 33.1*  ?MCV 94.4 95.4 100.0  ?MCH 30.2 30.9 34.4*  ?MCHC 32.0 32.4 34.4  ?RDW 13.3 13.4 14.3  ?PLT 130* 133* 144*  ? ?Thyroid  ?Recent Labs  ?Lab 01/05/22 ?1041  ?TSH 1.716  ?  ?BNP ?Recent Labs  ?Lab 01/05/22 ?0116  ?BNP 184.1*  ?  ?DDimer No results for input(s): DDIMER in the last 168 hours.  ? ?Radiology  ?  ?No results found. ? ?Cardiac Studies  ? ?10/30/21 echo ?IMPRESSIONS  ? ? ? 1. Left ventricular ejection fraction, by estimation, is 60 to 65%. The  ?left ventricle has normal function. The left ventricle has no regional  ?wall motion abnormalities. Left ventricular diastolic parameters are   ?indeterminate.  ? 2. Right ventricular systolic function is mildly reduced. The right  ?ventricular size is mildly enlarged. There is moderately elevated  ?pulmonary artery systolic pressure.  ? 3. Left atrial size was moderately dilated.  ? 4. Right atrial size was moderately dilated.  ? 5. Normal appearing bioprosthetic MVR no PVL low mean gradient at HR 58  ?bpm Stent struts protrude into LVOT but no obvious sub valvular gradients  ?Marland Kitchen The mitral valve has been repaired/replaced. No evidence of mitral valve  ?regurgitation. No evidence of  ?mitral stenosis.  ? 6. Tricuspid valve regurgitation is moderate.  ? 7. The aortic valve is tricuspid. There is moderate calcification of the  ?aortic valve. There is moderate thickening of the aortic valve. Aortic  ?valve regurgitation is not visualized. Mild aortic valve stenosis.  ? 8. The inferior vena cava is normal in size with greater than 50%  ?respiratory variability, suggesting right atrial pressure of 3 mmHg.  ? ?FINDINGS  ? Left Ventricle: Left ventricular ejection fraction, by estimation, is 60  ?to 65%. The left ventricle has normal function. The left ventricle has no  ?regional wall motion abnormalities. The left ventricular internal cavity  ?size was normal in size. There is  ? no left ventricular hypertrophy. Left ventricular diastolic parameters  ?are indeterminate.  ? ?Right Ventricle: The right ventricular size is mildly enlarged. No  ?increase in right ventricular wall thickness. Right ventricular systolic  ?function is mildly reduced. There is moderately elevated pulmonary artery  ?systolic pressure. The tricuspid  ?regurgitant velocity is 3.29 m/s, and with an assumed right atrial  ?pressure of 15 mmHg, the estimated right ventricular systolic pressure is  ?84.6 mmHg.  ? ?Left Atrium: Left atrial size was moderately dilated.  ? ?Right Atrium: Right atrial size was moderately dilated.  ? ?Pericardium: There is no evidence of pericardial effusion.   ? ?Mitral Valve: Normal appearing bioprosthetic MVR no PVL low mean gradient  ?at HR  58 bpm Stent struts protrude into LVOT but no obvious sub valvular  ?gradients. The mitral valve has been repaired/replaced. No evidence of  ?mitral valve regurgitation. No  ?evidence of mitral valve stenosis. MV peak gradient, 15.4 mmHg. The mean  ?mitral valve gradient is 4.0 mmHg.  ? ?Tricuspid Valve: The tricuspid valve is normal in structure. Tricuspid  ?valve regurgitation is moderate . No evidence of tricuspid stenosis.  ? ?Aortic Valve: The aortic valve is tricuspid. There is moderate  ?calcification of the aortic valve. There is moderate thickening of the  ?aortic valve. Aortic valve regurgitation is not visualized. Mild aortic  ?stenosis is present. Aortic valve mean gradient  ?measures 13.0 mmHg. Aortic valve peak gradient measures 24.2 mmHg.  ? ?Pulmonic Valve: The pulmonic valve was normal in structure. Pulmonic valve  ?regurgitation is mild. No evidence of pulmonic stenosis.  ? ?Aorta: The aortic root is normal in size and structure.  ? ?Patient Profile  ?   ?87 y.o. 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 ?Assessment & Plan  ?  ?Acute hypoxic respiratory in the setting of COPD/HFpEF: reported she has not felt well since being discharged back in February. Suspect etiology is multifactorial, though likely that her CHF is contributing. BNP elevated and CXR with  small left pleural effusion.  ?-- given IV lasix '60mg'$  x2 on admission, net -2.7L. though wt is increased by 1 Kg.  On lasix 40 daily IV  ?- continue home nebs and steroids  ?  ?Paroxysmal afib/junctional rhythm: she was documented to be in SR at last office visit. Though no EKG proves this and freq her rate is controlled in atrial fib.   Afib with episode of RVR on telemetry with this admit. May be contributing to  her HF. Has been on Toprol '25mg'$  daily PTA. May need to consider addition AAD? ?-- on Toprol '25mg'$  daily, increased to 37.'5mg'$  daily but held yesterday to bradycardia  did receive today  occ 3 sec pauses.   But

## 2022-01-08 NOTE — Progress Notes (Signed)
Mobility Specialist Progress Note ? ? 01/08/22 1600  ?Mobility  ?Activity Ambulated with assistance in hallway  ?Level of Assistance Contact guard assist, steadying assist  ?Assistive Device  ?(HHA)  ?Distance Ambulated (ft) 340 ft  ?Activity Response Tolerated well  ?$Mobility charge 1 Mobility  ? ?Pre Mobility: 73 HR, BP, 94% SpO2 on RA  ?During Mobility: 133 HR, 90% SpO2 on RA ?Post Mobility: 86 HR, 148/77 BP, 92% SpO2 on RA ? ?Received pt in chair having no complaints and eager for ambulation. X3 episodes of Afib >122bpm during ambulation. Took a standing rest break and practiced PLB each episode and pt was able to to stabilize self. Pt never voiced any complaints during ambulation and needed no AD during, did use arm as a HHA throughout. Returned back to chair w/o fault and call bell in reach.  ? ?Holland Falling ?Mobility Specialist ?Phone Number [phone] ? ?

## 2022-01-08 NOTE — Progress Notes (Signed)
ANTICOAGULATION CONSULT NOTE - Follow Up Consult ? ?Pharmacy Consult for Warfarin ?Indication: atrial fibrillation ? ?Allergies  ?Allergen Reactions  ? Aldactone [Spironolactone] Other (See Comments)  ?  dyspnea  ? Amoxicillin Palpitations  ?  Tachycardia ?Has patient had a PCN reaction causing immediate rash, facial/tongue/throat swelling, SOB or lightheadedness with hypotension: no ?Has patient had a PCN reaction causing severe rash involving mucus membranes or skin necrosis: {no ?Has patient had a PCN reaction that required hospitalization {no ?Has patient had a PCN reaction occurring within the last 10 years: {yes ?If all of the above answers are "NO", then may proceed with Cephalosporin use.  ? Diltiazem Other (See Comments)  ?  Causing headaches ?  ? Flagyl [Metronidazole Hcl] Other (See Comments)  ?  Causing headaches ? ?  ? Flovent [Fluticasone Propionate] Other (See Comments)  ?  Leg cramps  ? Gabapentin Swelling  ? Lyrica [Pregabalin] Swelling  ? Quinidine Diarrhea and Other (See Comments)  ?  Fever diarrhea  ? Simvastatin Other (See Comments)  ?  Leg pain, myalgia  ? Tramadol Nausea Only  ? Verapamil Other (See Comments)  ?  myalgias  ? Amlodipine   ?  Low extremity edema  ? Crestor [Rosuvastatin]   ?  myalgia  ? Livalo [Pitavastatin]   ?  myalgias  ? Zetia [Ezetimibe]   ?  LEG CRAMPS ?  ? Benazepril Hcl Cough  ? Ciprofloxacin Diarrhea  ? Codeine Nausea Only  ? Nitrofurantoin Monohyd Macro Nausea Only  ? ? ?Patient Measurements: ?Height: '5\' 3"'$  (160 cm) ?Weight: 69.3 kg (152 lb 12.5 oz) ?IBW/kg (Calculated) : 52.4 ? ?Vital Signs: ?Temp: 97.7 ?F (36.5 ?C) (04/14 7622) ?Temp Source: Oral (04/14 6333) ?BP: 136/59 (04/14 5456) ?Pulse Rate: 70 (04/14 0812) ? ?Labs: ?Recent Labs  ?  01/06/22 ?2563 01/06/22 ?8937 01/06/22 ?3428 01/07/22 ?0022 01/08/22 ?0040  ?HGB  --  11.4*   < > 11.5* 11.4*  ?HCT  --  35.6*  --  35.5* 33.1*  ?PLT  --  130*  --  133* 144*  ?LABPROT  --  31.6*  --  32.1* 25.3*  ?INR  --  3.1*  --   3.1* 2.3*  ?CREATININE 1.05*  --   --  0.92 0.80  ? < > = values in this interval not displayed.  ? ? ? ?Estimated Creatinine Clearance: 48.9 mL/min (by C-G formula based on SCr of 0.8 mg/dL). ? ? ?PTA Anticoagulation: ?Home warfarin 2 mg daily except 1 mg on Wednesday and Saturday  ? ?Assessment: ?On warfarin PTA for atrial fibrillation, also has bioprosthetic MVR (2018). Received warfarin 1 mg x 1 on 4/11. PLTs and Hgb are stable. INR 3.1>2.3 now down after holding dose on 4/11, 4/12. No s/sx bleeding reported and eating well. Will restart warfarin today.  ? ?Noted possible DDI with doxycycline.  ? ?Goal of Therapy:  ?INR 2-3 ?Monitor platelets by anticoagulation protocol: Yes ?  ?Plan:  ?Warfarin '2mg'$  today ?Check INR daily ?Monitor Hgb/Hct and PLTs daily  ? ?Erin Hearing PharmD., BCPS ?Clinical Pharmacist ?01/08/2022 9:57 AM ? ?

## 2022-01-08 NOTE — Assessment & Plan Note (Signed)
Continue with diazepam at night and during the day will add as needed alprazolam.  ?

## 2022-01-08 NOTE — Progress Notes (Signed)
Nutrition Follow-up ? ?DOCUMENTATION CODES:  ? ?Not applicable ? ?INTERVENTION:  ? ?- Continue Ensure Enlive po BID, each supplement provides 350 kcal and 20 grams of protein ?  ?- Continue MVI with minerals daily ?  ?- Encourage PO intake ? ?NUTRITION DIAGNOSIS:  ? ?Increased nutrient needs related to chronic illness (CHF, COPD) as evidenced by estimated needs. ? ?Ongoing, being addressed via oral nutrition supplements ? ?GOAL:  ? ?Patient will meet greater than or equal to 90% of their needs ? ?Progressing ? ?MONITOR:  ? ?PO intake, Supplement acceptance, Labs, Weight trends, I & O's ? ?REASON FOR ASSESSMENT:  ? ?Consult ?COPD Protocol ? ?ASSESSMENT:  ? ?87 year old female who presented to the ED on 4/11 with SOB. PMH of atrial fibrillation, mitral valve replacement, CHF, COPD, HTN, CAD s/p CABG, CKD stage III. ? ?RD consulted for assessment. Initial Nutrition Assessment was completed by this RD on 01/06/22. Spoke with pt at bedside. She reports that her appetite is improving and that she loves the Ensure supplements. Pt reports that she is only able to eat so much due to feeling full. Pt reports not having a bowel movement in several days. RD discussed with pt that MD had ordered dulcolax 5 mg once and miralax. Pt is hopeful that this will help her feel better. Pt with no other nutrition-related concerns at this time. ? ?Admit weight: 68.9 kg ?Current weight: 69.3 kg ? ?Pt with non-pitting edema to BLE. ? ?Meal Completion: 60-100% ? ?Medications reviewed and include: Ensure Enlive BID, IV lasix 40 mg daily, IV solu-medrol, MVI with minerals, klor-con 40 mEq daily, warfarin ? ?Labs reviewed: BUN 25, platelets 144 ? ?UOP: 1900 ml x 24 hours ?I/O's: -2.5 L since admit ? ?Diet Order:   ?Diet Order   ? ?       ?  Diet Heart Room service appropriate? Yes; Fluid consistency: Thin; Fluid restriction: 1800 mL Fluid  Diet effective now       ?  ? ?  ?  ? ?  ? ? ?EDUCATION NEEDS:  ? ?Education needs have been  addressed ? ?Skin:  Skin Assessment: Reviewed RN Assessment ? ?Last BM:  01/05/22 ? ?Height:  ? ?Ht Readings from Last 1 Encounters:  ?01/05/22 '5\' 3"'$  (1.6 m)  ? ? ?Weight:  ? ?Wt Readings from Last 1 Encounters:  ?01/08/22 69.3 kg  ? ? ?BMI:  Body mass index is 27.06 kg/m?. ? ?Estimated Nutritional Needs:  ? ?Kcal:  1600-1800 ? ?Protein:  80-95 grams ? ?Fluid:  1.6-1.8 L ? ? ? ?Gustavus Bryant, MS, RD, LDN ?Inpatient Clinical Dietitian ?Please see AMiON for contact information. ? ?

## 2022-01-09 DIAGNOSIS — J9601 Acute respiratory failure with hypoxia: Secondary | ICD-10-CM | POA: Diagnosis not present

## 2022-01-09 LAB — BASIC METABOLIC PANEL
Anion gap: 7 (ref 5–15)
BUN: 26 mg/dL — ABNORMAL HIGH (ref 8–23)
CO2: 26 mmol/L (ref 22–32)
Calcium: 9.1 mg/dL (ref 8.9–10.3)
Chloride: 106 mmol/L (ref 98–111)
Creatinine, Ser: 0.77 mg/dL (ref 0.44–1.00)
GFR, Estimated: >60 mL/min (ref >60)
Glucose, Bld: 131 mg/dL — ABNORMAL HIGH (ref 70–99)
Potassium: 4.4 mmol/L (ref 3.5–5.1)
Sodium: 139 mmol/L (ref 135–145)

## 2022-01-09 LAB — CBC
HCT: 37.3 % (ref 36.0–46.0)
Hemoglobin: 12.7 g/dL (ref 12.0–15.0)
MCH: 31.1 pg (ref 26.0–34.0)
MCHC: 34 g/dL (ref 30.0–36.0)
MCV: 91.4 fL (ref 80.0–100.0)
Platelets: 138 10*3/uL — ABNORMAL LOW (ref 150–400)
RBC: 4.08 MIL/uL (ref 3.87–5.11)
RDW: 13.3 % (ref 11.5–15.5)
WBC: 7.3 10*3/uL (ref 4.0–10.5)
nRBC: 0 % (ref 0.0–0.2)

## 2022-01-09 LAB — PROTIME-INR
INR: 1.8 — ABNORMAL HIGH (ref 0.8–1.2)
Prothrombin Time: 20.7 seconds — ABNORMAL HIGH (ref 11.4–15.2)

## 2022-01-09 MED ORDER — FUROSEMIDE 10 MG/ML IJ SOLN
40.0000 mg | Freq: Two times a day (BID) | INTRAMUSCULAR | Status: DC
Start: 2022-01-09 — End: 2022-01-10
  Administered 2022-01-09 – 2022-01-10 (×2): 40 mg via INTRAVENOUS
  Filled 2022-01-09 (×2): qty 4

## 2022-01-09 MED ORDER — LEVALBUTEROL HCL 1.25 MG/0.5ML IN NEBU
1.2500 mg | INHALATION_SOLUTION | Freq: Four times a day (QID) | RESPIRATORY_TRACT | Status: DC
  Administered 2022-01-09: 1.25 mg via RESPIRATORY_TRACT
  Filled 2022-01-09: qty 0.5

## 2022-01-09 MED ORDER — WARFARIN SODIUM 2 MG PO TABS
2.0000 mg | ORAL_TABLET | Freq: Once | ORAL | Status: AC
  Administered 2022-01-09: 2 mg via ORAL
  Filled 2022-01-09: qty 1

## 2022-01-09 MED ORDER — IPRATROPIUM BROMIDE 0.02 % IN SOLN
0.5000 mg | Freq: Four times a day (QID) | RESPIRATORY_TRACT | Status: DC
  Administered 2022-01-09: 0.5 mg via RESPIRATORY_TRACT
  Filled 2022-01-09: qty 2.5

## 2022-01-09 NOTE — Progress Notes (Signed)
MD notified of elevated blood pressure 171/58. Will continue to monitor. ?

## 2022-01-09 NOTE — Progress Notes (Signed)
? ?Progress Note ? ?Patient Name: Catherine [name] ?Date of Encounter: 01/09/2022 ? ?[facility] Cardiologist: [name], MD  ? ?Subjective  ? ?Ongoing SOB ? ?Inpatient Medications  ?  ?Scheduled Meds: ? arformoterol  15 mcg Nebulization BID  ? budesonide (PULMICORT) nebulizer solution  0.5 mg Nebulization BID  ? diazepam  5 mg Oral QHS  ? feeding supplement  237 mL Oral BID BM  ? furosemide  40 mg Intravenous Daily  ? guaiFENesin  600 mg Oral BID  ? ipratropium-albuterol  3 mL Nebulization Q6H  ? mouth rinse  15 mL Mouth Rinse BID  ? methylPREDNISolone (SOLU-MEDROL) injection  40 mg Intravenous Q12H  ? metoprolol succinate  37.5 mg Oral Daily  ? mometasone-formoterol  2 puff Inhalation BID  ? multivitamin with minerals  1 tablet Oral Daily  ? polyethylene glycol  17 g Oral BID  ? potassium chloride  40 mEq Oral Daily  ? rosuvastatin  5 mg Oral Once per day on Sun Tue Thu Sat  ? sodium chloride flush  3 mL Intravenous Q12H  ? Warfarin - Pharmacist Dosing Inpatient   Does not apply E5631  ? ?Continuous Infusions: ? sodium chloride    ? ?PRN Meds: ?sodium chloride, acetaminophen, albuterol, ALPRAZolam, guaiFENesin-dextromethorphan, ondansetron (ZOFRAN) IV, sodium chloride flush  ? ?Vital Signs  ?  ?Vitals:  ? 01/09/22 0400 01/09/22 0500 01/09/22 0600 01/09/22 0738  ?BP:    (!) 160/67  ?Pulse: 64 (!) 59 (!) 59 67  ?Resp: 20 16 (!) 24 15  ?Temp:    97.9 ?F (36.6 ?C)  ?TempSrc:    Oral  ?SpO2: 92% 92% 93% 92%  ?Weight:      ?Height:      ? ? ?Intake/Output Summary (Last 24 hours) at 01/09/2022 0934 ?Last data filed at 01/09/2022 0800 ?Gross per 24 hour  ?Intake 1017 ml  ?Output 1 ml  ?Net 1016 ml  ? ? ?  01/09/2022  ?  3:45 AM 01/08/2022  ?  6:39 AM 01/07/2022  ?  4:00 AM  ?Last 3 Weights  ?Weight (lbs) 151 lb 0.2 oz 152 lb 12.5 oz 153 lb  ?Weight (kg) 68.5 kg 69.3 kg 69.4 kg  ?   ? ?Telemetry  ?  ?Afib, paroxysms of RVR not sustained - Personally Reviewed ? ?ECG  ?  ?N/a - Personally Reviewed ? ?Physical Exam   ? ?GEN: No acute distress.   ?Neck: +JVD ?Cardiac: irreg, 2/6 systolic murmur rusb ?Respiratory: Clear to auscultation bilaterally. ?GI: Soft, nontender, non-distended  ?MS: 1+ bilateral LE edema; No deformity. ?Neuro:  Nonfocal  ?Psych: Normal affect  ? ?Labs  ?  ?High Sensitivity Troponin:   ?Recent Labs  ?Lab 01/05/22 ?0116 01/05/22 ?0313  ?TROPONINIHS 10 10  ?   ?Chemistry ?Recent Labs  ?Lab 01/05/22 ?0116 01/05/22 ?1041 01/06/22 ?4970 01/07/22 ?0022 01/08/22 ?0040 01/09/22 ?2637  ?NA 139  --    < > 140 136 139  ?K 3.3*  --    < > 4.5 4.5 4.4  ?CL 103  --    < > 107 105 106  ?CO2 24  --    < > '27 25 26  '$ ?GLUCOSE 128*  --    < > 200* 131* 131*  ?BUN 21  --    < > 22 25* 26*  ?CREATININE 0.97  --    < > 0.92 0.80 0.77  ?CALCIUM 9.4  --    < > 9.0 8.9 9.1  ?MG  --  1.9  --   --   --   --   ?PROT 7.1  --   --   --   --   --   ?ALBUMIN 4.1  --   --   --   --   --   ?AST 19  --   --   --   --   --   ?ALT 21  --   --   --   --   --   ?ALKPHOS 87  --   --   --   --   --   ?BILITOT 0.2*  --   --   --   --   --   ?GFRNONAA 58*  --    < > >60 >60 >60  ?ANIONGAP 12  --    < > '6 6 7  '$ ? < > = values in this interval not displayed.  ?  ?Lipids No results for input(s): CHOL, TRIG, HDL, LABVLDL, LDLCALC, CHOLHDL in the last 168 hours.  ?Hematology ?Recent Labs  ?Lab 01/07/22 ?0022 01/08/22 ?0040 01/09/22 ?3419  ?WBC 8.1 8.3 7.3  ?RBC 3.72* 3.31* 4.08  ?HGB 11.5* 11.4* 12.7  ?HCT 35.5* 33.1* 37.3  ?MCV 95.4 100.0 91.4  ?MCH 30.9 34.4* 31.1  ?MCHC 32.4 34.4 34.0  ?RDW 13.4 14.3 13.3  ?PLT 133* 144* 138*  ? ?Thyroid  ?Recent Labs  ?Lab 01/05/22 ?1041  ?TSH 1.716  ?  ?BNP ?Recent Labs  ?Lab 01/05/22 ?0116  ?BNP 184.1*  ?  ?DDimer No results for input(s): DDIMER in the last 168 hours.  ? ?Radiology  ?  ?No results found. ? ?Cardiac Studies  ? ?10/30/21 echo ?IMPRESSIONS  ? ? ? 1. Left ventricular ejection fraction, by estimation, is 60 to 65%. The  ?left ventricle has normal function. The left ventricle has no regional  ?wall motion  abnormalities. Left ventricular diastolic parameters are  ?indeterminate.  ? 2. Right ventricular systolic function is mildly reduced. The right  ?ventricular size is mildly enlarged. There is moderately elevated  ?pulmonary artery systolic pressure.  ? 3. Left atrial size was moderately dilated.  ? 4. Right atrial size was moderately dilated.  ? 5. Normal appearing bioprosthetic MVR no PVL low mean gradient at HR 58  ?bpm Stent struts protrude into LVOT but no obvious sub valvular gradients  ?Marland Kitchen The mitral valve has been repaired/replaced. No evidence of mitral valve  ?regurgitation. No evidence of  ?mitral stenosis.  ? 6. Tricuspid valve regurgitation is moderate.  ? 7. The aortic valve is tricuspid. There is moderate calcification of the  ?aortic valve. There is moderate thickening of the aortic valve. Aortic  ?valve regurgitation is not visualized. Mild aortic valve stenosis.  ? 8. The inferior vena cava is normal in size with greater than 50%  ?respiratory variability, suggesting right atrial pressure of 3 mmHg.  ? ?FINDINGS  ? Left Ventricle: Left ventricular ejection fraction, by estimation, is 60  ?to 65%. The left ventricle has normal function. The left ventricle has no  ?regional wall motion abnormalities. The left ventricular internal cavity  ?size was normal in size. There is  ? no left ventricular hypertrophy. Left ventricular diastolic parameters  ?are indeterminate.  ? ?Right Ventricle: The right ventricular size is mildly enlarged. No  ?increase in right ventricular wall thickness. Right ventricular systolic  ?function is mildly reduced. There is moderately elevated pulmonary artery  ?systolic pressure. The tricuspid  ?regurgitant velocity is 3.29 m/s, and with  an assumed right atrial  ?pressure of 15 mmHg, the estimated right ventricular systolic pressure is  ?32.1 mmHg.  ? ?Left Atrium: Left atrial size was moderately dilated.  ? ?Right Atrium: Right atrial size was moderately dilated.   ? ?Pericardium: There is no evidence of pericardial effusion.  ? ?Mitral Valve: Normal appearing bioprosthetic MVR no PVL low mean gradient  ?at HR 58 bpm Stent struts protrude into LVOT but no obvious sub valvular  ?gradients. The mitral valve has been repaired/replaced. No evidence of  ?mitral valve regurgitation. No  ?evidence of mitral valve stenosis. MV peak gradient, 15.4 mmHg. The mean  ?mitral valve gradient is 4.0 mmHg.  ? ?Tricuspid Valve: The tricuspid valve is normal in structure. Tricuspid  ?valve regurgitation is moderate . No evidence of tricuspid stenosis.  ? ?Aortic Valve: The aortic valve is tricuspid. There is moderate  ?calcification of the aortic valve. There is moderate thickening of the  ?aortic valve. Aortic valve regurgitation is not visualized. Mild aortic  ?stenosis is present. Aortic valve mean gradient  ?measures 13.0 mmHg. Aortic valve peak gradient measures 24.2 mmHg.  ? ?Pulmonic Valve: The pulmonic valve was normal in structure. Pulmonic valve  ?regurgitation is mild. No evidence of pulmonic stenosis.  ? ?Aorta: The aortic root is normal in size and structure.  ? ?Patient Profile  ?   ?87 y.o. 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 ?Assessment & Plan  ?  ?1.Acute on chronic HFpEF ?- 10/2021 echo LVEF 60-65%, no WMAs, indet diastolic, mild RV dysfunction, mod pulm HTN, mod BAE, normal MVR, mild AS mean grad 13 AVA VTI not reported ?- I/Os incomplete yesterday, from admit reported neg 2.2 L. She ison IV lasix '40mg'$  daily. Downtrend in Cr with diuresis consitent with venous congestion and CHF ? ?- remains fluid overloaded, LE edema and elevated JVD. Increase IV lasix to '40mg'$  bid ? ? ?2.COPD exacerbation ?- per primary team ? ?3. Longstanding persistent afib/Tachy-brady ?- varaible rates. Primarily rate controlled afib, paroxysms of RVR  that are not sustained, occasional low rates. She is getting albuterol nebs, IV solumedrol with active COPD exacerbation.  ?- monitor rates as COPD exacerbation resolves and weans off nebs and steroids. Cautious titration o

## 2022-01-09 NOTE — Progress Notes (Signed)
ANTICOAGULATION CONSULT NOTE - Follow Up Consult ? ?Pharmacy Consult for Warfarin ?Indication: atrial fibrillation ? ?Allergies  ?Allergen Reactions  ? Aldactone [Spironolactone] Other (See Comments)  ?  dyspnea  ? Amoxicillin Palpitations  ?  Tachycardia ?Has patient had a PCN reaction causing immediate rash, facial/tongue/throat swelling, SOB or lightheadedness with hypotension: no ?Has patient had a PCN reaction causing severe rash involving mucus membranes or skin necrosis: {no ?Has patient had a PCN reaction that required hospitalization {no ?Has patient had a PCN reaction occurring within the last 10 years: {yes ?If all of the above answers are "NO", then may proceed with Cephalosporin use.  ? Diltiazem Other (See Comments)  ?  Causing headaches ?  ? Flagyl [Metronidazole Hcl] Other (See Comments)  ?  Causing headaches ? ?  ? Flovent [Fluticasone Propionate] Other (See Comments)  ?  Leg cramps  ? Gabapentin Swelling  ? Lyrica [Pregabalin] Swelling  ? Quinidine Diarrhea and Other (See Comments)  ?  Fever diarrhea  ? Simvastatin Other (See Comments)  ?  Leg pain, myalgia  ? Tramadol Nausea Only  ? Verapamil Other (See Comments)  ?  myalgias  ? Amlodipine   ?  Low extremity edema  ? Crestor [Rosuvastatin]   ?  myalgia  ? Livalo [Pitavastatin]   ?  myalgias  ? Zetia [Ezetimibe]   ?  LEG CRAMPS ?  ? Benazepril Hcl Cough  ? Ciprofloxacin Diarrhea  ? Codeine Nausea Only  ? Nitrofurantoin Monohyd Macro Nausea Only  ? ? ?Patient Measurements: ?Height: '5\' 3"'$  (160 cm) ?Weight: 68.5 kg (151 lb 0.2 oz) ?IBW/kg (Calculated) : 52.4 ? ?Vital Signs: ?Temp: 97.6 ?F (36.4 ?C) (04/15 1116) ?Temp Source: Oral (04/15 1116) ?BP: 171/58 (04/15 1116) ?Pulse Rate: 62 (04/15 1116) ? ?Labs: ?Recent Labs  ?  01/07/22 ?0022 01/08/22 ?0040 01/09/22 ?9983  ?HGB 11.5* 11.4* 12.7  ?HCT 35.5* 33.1* 37.3  ?PLT 133* 144* 138*  ?LABPROT 32.1* 25.3* 20.7*  ?INR 3.1* 2.3* 1.8*  ?CREATININE 0.92 0.80 0.77  ? ? ? ?Estimated Creatinine Clearance: 48.6  mL/min (by C-G formula based on SCr of 0.77 mg/dL). ? ? ?PTA Anticoagulation: ?Home warfarin 2 mg daily except 1 mg on Wednesday and Saturday  ? ?Assessment: ?On warfarin PTA for atrial fibrillation, also has bioprosthetic MVR (2018). Received warfarin 1 mg x 1 on 4/11. PLTs and Hgb are stable. INR 3.1>1.8 now down after holding dose on 4/11, 4/12. No s/sx bleeding reported and eating well, warfarin resumed 4/14.  ? ?INR 1.8 today, CBC stable and improving.  ? ?Noted possible DDI with doxycycline.  ? ?Goal of Therapy:  ?INR 2-3 ?Monitor platelets by anticoagulation protocol: Yes ?  ?Plan:  ?Warfarin '2mg'$  today ?Check INR daily ?Monitor Hgb/Hct and plts daily  ? ?Cathrine Muster, PharmD ?PGY2 Cardiology Pharmacy Resident ?Phone: [phone] ? ?01/09/2022  2:57 PM ? ?Please check [email or web address] for unit-specific pharmacy phone numbers. ? ? ?

## 2022-01-09 NOTE — Progress Notes (Addendum)
?PROGRESS NOTE ? ?Catherine [name]:[id number] DOB: [birth year] DOA: 01/05/2022 ?PCP: [name], MD ? ? LOS: 4 days  ? ?Brief Narrative / Interim history: ?This is an 87 year old female with chronic diastolic CHF on torsemide, A-fib on Coumadin, COPD, CAD, bioprosthetic MVR, CABG x1, history of maze, comes into the hospital with progressive dyspnea on exertion as well as weight gain and peripheral edema.  Given wheezing on admission she was started on nebulizers along with steroids, as well as diuretics for her fluid overload.  Cardiology was consulted. ? ?Subjective / 24h Interval events: ?Complaining of shortness of breath as well as significant weakness. ? ?Assesement and Plan: ?Principal Problem: ?  Acute respiratory failure with hypoxia  secondary to acute on chronic COPD and diastolic congestive heart failure exacerbation ?Active Problems: ?  Atrial fibrillation ([place]) ?  Hypertension ?  Chronic kidney disease ?  Coronary artery disease ?  Anxiety ? ? ?Assessment and Plan: ?Principal problem ?Acute respiratory failure with hypoxia  secondary to acute on chronic COPD and diastolic congestive heart failure exacerbation -cardiology consulted, appreciate input.  Continue furosemide, she still has wheezing today and will increase dose.  Continue corticosteroids as well as nebulizers.  It appears that her hypoxia is now resolved and she appears comfortable on room air, however still dyspneic and with wheezing. ? ?Active problems ?Atrial fibrillation, intermittent RVR-Continue heart rate control with metoprolol and anticoagulation with Coumadin.  She cannot be on Eliquis or Xarelto because this is valvular A-fib ? ?Hypertension -Continue metoprolol as below, on furosemide ? ?CKD 3a -Cr stable with diuresis ? ?Coronary artery disease -on aspirin, statin, no chest pain or ACS type features ? ?Dyslipidemia- continue with rosuvastatin.   ? ?Anxiety -Continue with diazepam at night and during the day will add as  needed alprazolam.  ? ? ?Scheduled Meds: ? arformoterol  15 mcg Nebulization BID  ? budesonide (PULMICORT) nebulizer solution  0.5 mg Nebulization BID  ? diazepam  5 mg Oral QHS  ? feeding supplement  237 mL Oral BID BM  ? furosemide  40 mg Intravenous BID  ? guaiFENesin  600 mg Oral BID  ? ipratropium-albuterol  3 mL Nebulization Q6H  ? mouth rinse  15 mL Mouth Rinse BID  ? methylPREDNISolone (SOLU-MEDROL) injection  40 mg Intravenous Q12H  ? metoprolol succinate  37.5 mg Oral Daily  ? mometasone-formoterol  2 puff Inhalation BID  ? multivitamin with minerals  1 tablet Oral Daily  ? polyethylene glycol  17 g Oral BID  ? potassium chloride  40 mEq Oral Daily  ? rosuvastatin  5 mg Oral Once per day on Sun Tue Thu Sat  ? sodium chloride flush  3 mL Intravenous Q12H  ? Warfarin - Pharmacist Dosing Inpatient   Does not apply W1191  ? ?Continuous Infusions: ? sodium chloride    ? ?PRN Meds:.sodium chloride, acetaminophen, albuterol, ALPRAZolam, guaiFENesin-dextromethorphan, ondansetron (ZOFRAN) IV, sodium chloride flush ? ?Diet Orders (From admission, onward)  ? ?  Start     Ordered  ? 01/05/22 1108  Diet Heart Room service appropriate? Yes; Fluid consistency: Thin; Fluid restriction: 1800 mL Fluid  Diet effective now       ?Question Answer Comment  ?Room service appropriate? Yes   ?Fluid consistency: Thin   ?Fluid restriction: 1800 mL Fluid   ?  ? 01/05/22 1107  ? ?  ?  ? ?  ? ? ?DVT prophylaxis: Place TED hose Start: 01/06/22 0816 ? ? ?Lab Results  ?Component Value  Date  ? PLT 138 (L) 01/09/2022  ? ? ?  Code Status: Full Code ? ?Family Communication: No family at bedside ? ?Status is: Inpatient ?Remains inpatient appropriate because: Persistent fluid overload, wheezing, dyspnea ? ?Level of care: Telemetry Cardiac ? ?Consultants:  ?Cardiology ? ?Procedures:  ?none ? ?Microbiology  ?none ? ?Antimicrobials: ?none  ? ? ?Objective: ?Vitals:  ? 01/09/22 0400 01/09/22 0500 01/09/22 0600 01/09/22 0738  ?BP:    (!) 160/67   ?Pulse: 64 (!) 59 (!) 59 67  ?Resp: 20 16 (!) 24 15  ?Temp:    97.9 ?F (36.6 ?C)  ?TempSrc:    Oral  ?SpO2: 92% 92% 93% 92%  ?Weight:      ?Height:      ? ? ?Intake/Output Summary (Last 24 hours) at 01/09/2022 1115 ?Last data filed at 01/09/2022 0800 ?Gross per 24 hour  ?Intake 780 ml  ?Output 1 ml  ?Net 779 ml  ? ?Wt Readings from Last 3 Encounters:  ?01/09/22 68.5 kg  ?12/21/21 69.2 kg  ?11/30/21 66.7 kg  ? ? ?Examination: ? ?Constitutional: NAD ?Eyes: no scleral icterus ?ENMT: Mucous membranes are moist.  ?Neck: normal, supple ?Respiratory: End expiratory wheezing, mainly on the right lung field.  Diminished at the bases ?Cardiovascular: Irregular.  1+ edema ?Abdomen: non distended, no tenderness. Bowel sounds positive.  ?Musculoskeletal: no clubbing / cyanosis.  ?Skin: no rashes ?Neurologic: non focal  ? ? ?Data Reviewed: I have independently reviewed following labs and imaging studies ? ?CBC ?Recent Labs  ?Lab 01/05/22 ?0116 01/06/22 ?9563 01/07/22 ?0022 01/08/22 ?0040 01/09/22 ?8756  ?WBC 6.7 8.4 8.1 8.3 7.3  ?HGB 12.6 11.4* 11.5* 11.4* 12.7  ?HCT 38.5 35.6* 35.5* 33.1* 37.3  ?PLT 128* 130* 133* 144* 138*  ?MCV 93.4 94.4 95.4 100.0 91.4  ?MCH 30.6 30.2 30.9 34.4* 31.1  ?MCHC 32.7 32.0 32.4 34.4 34.0  ?RDW 13.2 13.3 13.4 14.3 13.3  ?LYMPHSABS 0.7  --   --   --   --   ?MONOABS 0.6  --   --   --   --   ?EOSABS 0.1  --   --   --   --   ?BASOSABS 0.0  --   --   --   --   ? ? ?Recent Labs  ?Lab 01/05/22 ?0116 01/05/22 ?1041 01/06/22 ?4332 01/06/22 ?9518 01/07/22 ?0022 01/08/22 ?0040 01/09/22 ?8416  ?NA 139  --  140  --  140 136 139  ?K 3.3*  --  4.1  --  4.5 4.5 4.4  ?CL 103  --  106  --  107 105 106  ?CO2 24  --  28  --  '27 25 26  '$ ?GLUCOSE 128*  --  158*  --  200* 131* 131*  ?BUN 21  --  13  --  22 25* 26*  ?CREATININE 0.97  --  1.05*  --  0.92 0.80 0.77  ?CALCIUM 9.4  --  8.9  --  9.0 8.9 9.1  ?AST 19  --   --   --   --   --   --   ?ALT 21  --   --   --   --   --   --   ?ALKPHOS 87  --   --   --   --   --   --    ?BILITOT 0.2*  --   --   --   --   --   --   ?ALBUMIN  4.1  --   --   --   --   --   --   ?MG  --  1.9  --   --   --   --   --   ?PROCALCITON  --  0.11  --   --   --   --   --   ?INR 3.0*  --   --  3.1* 3.1* 2.3* 1.8*  ?TSH  --  1.716  --   --   --   --   --   ?BNP 184.1*  --   --   --   --   --   --   ? ? ?------------------------------------------------------------------------------------------------------------------ ?No results for input(s): CHOL, HDL, LDLCALC, TRIG, CHOLHDL, LDLDIRECT in the last 72 hours. ? ?Lab Results  ?Component Value Date  ? HGBA1C 5.3 10/12/2016  ? ?------------------------------------------------------------------------------------------------------------------ ?No results for input(s): TSH, T4TOTAL, T3FREE, THYROIDAB in the last 72 hours. ? ?Invalid input(s): FREET3 ? ?Cardiac Enzymes ?No results for input(s): CKMB, TROPONINI, MYOGLOBIN in the last 168 hours. ? ?Invalid input(s): CK ?------------------------------------------------------------------------------------------------------------------ ?   ?Component Value Date/Time  ? BNP 184.1 (H) 01/05/2022 0116  ? ? ?CBG: ?No results for input(s): GLUCAP in the last 168 hours. ? ?Recent Results (from the past 240 hour(s))  ?MRSA Next Gen by PCR, Nasal     Status: None  ? Collection Time: 01/05/22 10:40 AM  ? Specimen: Nasal Mucosa; Nasal Swab  ?Result Value Ref Range Status  ? MRSA by PCR Next Gen NOT DETECTED NOT DETECTED Final  ?  Comment: (NOTE) ?The GeneXpert MRSA Assay (FDA approved for NASAL specimens only), ?is one component of a comprehensive MRSA colonization surveillance ?program. It is not intended to diagnose MRSA infection nor to guide ?or monitor treatment for MRSA infections. ?Test performance is not FDA approved in patients less than 2 years ?old. ?Performed at [facility], [place] [address] [place], [place] ?[address] ?  ?  ? ?Radiology Studies: ?No results found. ? ? ?Marzetta Board, MD, PhD ?[facility] ? ?Between 7 am - 7 pm I am available, please contact me via Amion (for emergencies) or Securechat (non urgent messages) ? ?Between 7 pm - 7 am I am not available, please contact night coverage MD/APP via Amion ? ?

## 2022-01-09 NOTE — Plan of Care (Signed)
  Problem: Education: Goal: Ability to demonstrate management of disease process will improve Outcome: Progressing Goal: Ability to verbalize understanding of medication therapies will improve Outcome: Progressing   

## 2022-01-10 DIAGNOSIS — J9601 Acute respiratory failure with hypoxia: Secondary | ICD-10-CM | POA: Diagnosis not present

## 2022-01-10 LAB — CBC
HCT: 37.2 % (ref 36.0–46.0)
Hemoglobin: 12.4 g/dL (ref 12.0–15.0)
MCH: 30.5 pg (ref 26.0–34.0)
MCHC: 33.3 g/dL (ref 30.0–36.0)
MCV: 91.4 fL (ref 80.0–100.0)
Platelets: 161 10*3/uL (ref 150–400)
RBC: 4.07 MIL/uL (ref 3.87–5.11)
RDW: 13.1 % (ref 11.5–15.5)
WBC: 8.2 10*3/uL (ref 4.0–10.5)
nRBC: 0 % (ref 0.0–0.2)

## 2022-01-10 LAB — BASIC METABOLIC PANEL
Anion gap: 7 (ref 5–15)
BUN: 22 mg/dL (ref 8–23)
CO2: 29 mmol/L (ref 22–32)
Calcium: 8.9 mg/dL (ref 8.9–10.3)
Chloride: 102 mmol/L (ref 98–111)
Creatinine, Ser: 0.81 mg/dL (ref 0.44–1.00)
GFR, Estimated: >60 mL/min (ref >60)
Glucose, Bld: 129 mg/dL — ABNORMAL HIGH (ref 70–99)
Potassium: 4 mmol/L (ref 3.5–5.1)
Sodium: 138 mmol/L (ref 135–145)

## 2022-01-10 LAB — PROTIME-INR
INR: 2 — ABNORMAL HIGH (ref 0.8–1.2)
Prothrombin Time: 22.6 seconds — ABNORMAL HIGH (ref 11.4–15.2)

## 2022-01-10 MED ORDER — LEVALBUTEROL HCL 1.25 MG/0.5ML IN NEBU
1.2500 mg | INHALATION_SOLUTION | Freq: Three times a day (TID) | RESPIRATORY_TRACT | Status: DC
  Administered 2022-01-10 – 2022-01-12 (×8): 1.25 mg via RESPIRATORY_TRACT
  Filled 2022-01-10 (×8): qty 0.5

## 2022-01-10 MED ORDER — FUROSEMIDE 10 MG/ML IJ SOLN
20.0000 mg | Freq: Once | INTRAMUSCULAR | Status: AC
  Administered 2022-01-10: 20 mg via INTRAVENOUS
  Filled 2022-01-10: qty 2

## 2022-01-10 MED ORDER — FUROSEMIDE 10 MG/ML IJ SOLN
60.0000 mg | Freq: Two times a day (BID) | INTRAMUSCULAR | Status: DC
  Administered 2022-01-10 – 2022-01-11 (×2): 60 mg via INTRAVENOUS
  Filled 2022-01-10 (×2): qty 6

## 2022-01-10 MED ORDER — IPRATROPIUM BROMIDE 0.02 % IN SOLN
0.5000 mg | Freq: Three times a day (TID) | RESPIRATORY_TRACT | Status: DC
  Administered 2022-01-10 – 2022-01-12 (×8): 0.5 mg via RESPIRATORY_TRACT
  Filled 2022-01-10 (×8): qty 2.5

## 2022-01-10 MED ORDER — PREDNISONE 20 MG PO TABS
40.0000 mg | ORAL_TABLET | Freq: Every day | ORAL | Status: DC
  Administered 2022-01-11 – 2022-01-12 (×2): 40 mg via ORAL
  Filled 2022-01-10 (×2): qty 2

## 2022-01-10 MED ORDER — METOPROLOL SUCCINATE ER 50 MG PO TB24
50.0000 mg | ORAL_TABLET | Freq: Every day | ORAL | Status: DC
  Administered 2022-01-11: 50 mg via ORAL
  Filled 2022-01-10: qty 1

## 2022-01-10 MED ORDER — METOPROLOL SUCCINATE ER 25 MG PO TB24
12.5000 mg | ORAL_TABLET | Freq: Once | ORAL | Status: AC
  Administered 2022-01-10: 12.5 mg via ORAL
  Filled 2022-01-10: qty 1

## 2022-01-10 MED ORDER — WARFARIN SODIUM 2 MG PO TABS
2.0000 mg | ORAL_TABLET | Freq: Once | ORAL | Status: AC
  Administered 2022-01-10: 2 mg via ORAL
  Filled 2022-01-10: qty 1

## 2022-01-10 NOTE — Progress Notes (Signed)
Mobility Specialist Progress Note: ? ? 01/10/22 1030  ?Mobility  ?Activity Ambulated with assistance in hallway  ?Level of Assistance Contact guard assist, steadying assist  ?Assistive Device None  ?Distance Ambulated (ft) 500 ft  ?Activity Response Tolerated well  ?$Mobility charge 1 Mobility  ? ? ?Pre Mobility: SpO2 93% RA ?During Mobility: SpO2 85-91% RA ?Post Mobility: SpO2 93% RA ? ?Pt received on RA in bed, eager for mobility. Required CGA throughout d/t minor unsteadiness. Pt desat to 85% upon exertion on RA, recovered to 91% with a standing rest break and PLB. Pt left sitting up in chair with all needs met.  ? ?Nelta Numbers ?Acute Rehab ?Phone: [phone] ?Office Phone: [phone] ? ?

## 2022-01-10 NOTE — Progress Notes (Signed)
?PROGRESS NOTE ? ?Catherine [name] TFT:[id number] DOB: Aug 25, 1935 DOA: 01/05/2022 ?PCP: [name], MD ? ? LOS: 5 days  ? ?Brief Narrative / Interim history: ?This is an 87 year old female with chronic diastolic CHF on torsemide, A-fib on Coumadin, COPD, CAD, bioprosthetic MVR, CABG x1, history of maze, comes into the hospital with progressive dyspnea on exertion as well as weight gain and peripheral edema.  Given wheezing on admission she was started on nebulizers along with steroids, as well as diuretics for her fluid overload.  Cardiology was consulted. ? ?Subjective / 24h Interval events: ?Remains weak this morning.  Main complaint is incessant cough.  Still has shortness of breath. ? ?Assesement and Plan: ?Principal Problem: ?  Acute respiratory failure with hypoxia  secondary to acute on chronic COPD and diastolic congestive heart failure exacerbation ?Active Problems: ?  Atrial fibrillation ([place]) ?  Hypertension ?  Chronic kidney disease ?  Coronary artery disease ?  Anxiety ? ? ?Assessment and Plan: ?Principal problem ?Acute respiratory failure with hypoxia  secondary to acute on chronic COPD and diastolic congestive heart failure exacerbation -cardiology consulted, appreciate input.  Continue furosemide 60 twice daily.  Critical status overall improving however she still gets dyspneic with activities.  Continue nebulizers, change steroids to prednisone as wheezing is resolved today ? ?Active problems ?Atrial fibrillation, intermittent RVR-Continue heart rate control with metoprolol and anticoagulation with Coumadin.  She cannot be on Eliquis or Xarelto because this is valvular A-fib. Switch from albuterol to Xopenex due to intermittent RVR ? ?Hypertension -Continue metoprolol as below, on furosemide.  Blood pressure stable overall ? ?CKD 3a -Cr stable with diuresis ? ?Coronary artery disease -on aspirin, statin, no chest pain or ACS type features ? ?Dyslipidemia- continue with rosuvastatin.    ? ?Anxiety -Continue with diazepam at night and during the day will add as needed alprazolam.  ? ? ?Scheduled Meds: ? arformoterol  15 mcg Nebulization BID  ? budesonide (PULMICORT) nebulizer solution  0.5 mg Nebulization BID  ? diazepam  5 mg Oral QHS  ? feeding supplement  237 mL Oral BID BM  ? furosemide  20 mg Intravenous Once  ? furosemide  60 mg Intravenous BID  ? guaiFENesin  600 mg Oral BID  ? ipratropium  0.5 mg Nebulization TID  ? levalbuterol  1.25 mg Nebulization TID  ? mouth rinse  15 mL Mouth Rinse BID  ? metoprolol succinate  12.5 mg Oral Once  ? [START ON 01/11/2022] metoprolol succinate  50 mg Oral Daily  ? mometasone-formoterol  2 puff Inhalation BID  ? multivitamin with minerals  1 tablet Oral Daily  ? polyethylene glycol  17 g Oral BID  ? potassium chloride  40 mEq Oral Daily  ? [START ON 01/11/2022] predniSONE  40 mg Oral Q breakfast  ? rosuvastatin  5 mg Oral Once per day on Sun Tue Thu Sat  ? sodium chloride flush  3 mL Intravenous Q12H  ? Warfarin - Pharmacist Dosing Inpatient   Does not apply H0623  ? ?Continuous Infusions: ? sodium chloride    ? ?PRN Meds:.sodium chloride, acetaminophen, albuterol, ALPRAZolam, guaiFENesin-dextromethorphan, ondansetron (ZOFRAN) IV, sodium chloride flush ? ?Diet Orders (From admission, onward)  ? ?  Start     Ordered  ? 01/05/22 1108  Diet Heart Room service appropriate? Yes; Fluid consistency: Thin; Fluid restriction: 1800 mL Fluid  Diet effective now       ?Question Answer Comment  ?Room service appropriate? Yes   ?Fluid consistency: Thin   ?  Fluid restriction: 1800 mL Fluid   ?  ? 01/05/22 1107  ? ?  ?  ? ?  ? ? ?DVT prophylaxis: Place TED hose Start: 01/06/22 0816 ? ? ?Lab Results  ?Component Value Date  ? PLT 161 01/10/2022  ? ? ?  Code Status: Full Code ? ?Family Communication: No family at bedside, discussed with husband over the phone ? ?Status is: Inpatient ?Remains inpatient appropriate because: Persistent fluid overload, wheezing, dyspnea ? ?Level of  care: Telemetry Cardiac ? ?Consultants:  ?Cardiology ? ?Procedures:  ?none ? ?Microbiology  ?none ? ?Antimicrobials: ?none  ? ? ?Objective: ?Vitals:  ? 01/10/22 0258 01/10/22 0500 01/10/22 0805 01/10/22 0820  ?BP: (!) 137/97  (!) 142/61   ?Pulse: 83   65  ?Resp: 19     ?Temp: 97.7 ?F (36.5 ?C)  98.7 ?F (37.1 ?C)   ?TempSrc: Oral  Oral   ?SpO2: 92%  98%   ?Weight:  67.5 kg    ?Height:      ? ? ?Intake/Output Summary (Last 24 hours) at 01/10/2022 1020 ?Last data filed at 01/10/2022 0800 ?Gross per 24 hour  ?Intake 1027 ml  ?Output 1000 ml  ?Net 27 ml  ? ? ?Wt Readings from Last 3 Encounters:  ?01/10/22 67.5 kg  ?12/21/21 69.2 kg  ?11/30/21 66.7 kg  ? ? ?Examination: ? ?Constitutional: NAD ?Eyes: lids and conjunctivae normal, no scleral icterus ?ENMT: mmm ?Neck: normal, supple ?Respiratory: Faint bibasilar crackles, no wheezing ?Cardiovascular: Regular rate and rhythm, no murmurs / rubs / gallops. 1+ edema ?Abdomen: soft, no distention, no tenderness. Bowel sounds positive.  ?Skin: no rashes ?Neurologic: no focal deficits, equal strength ? ? ?Data Reviewed: I have independently reviewed following labs and imaging studies ? ?CBC ?Recent Labs  ?Lab 01/05/22 ?0116 01/06/22 ?1308 01/07/22 ?0022 01/08/22 ?0040 01/09/22 ?6578 01/10/22 ?0326  ?WBC 6.7 8.4 8.1 8.3 7.3 8.2  ?HGB 12.6 11.4* 11.5* 11.4* 12.7 12.4  ?HCT 38.5 35.6* 35.5* 33.1* 37.3 37.2  ?PLT 128* 130* 133* 144* 138* 161  ?MCV 93.4 94.4 95.4 100.0 91.4 91.4  ?MCH 30.6 30.2 30.9 34.4* 31.1 30.5  ?MCHC 32.7 32.0 32.4 34.4 34.0 33.3  ?RDW 13.2 13.3 13.4 14.3 13.3 13.1  ?LYMPHSABS 0.7  --   --   --   --   --   ?MONOABS 0.6  --   --   --   --   --   ?EOSABS 0.1  --   --   --   --   --   ?BASOSABS 0.0  --   --   --   --   --   ? ? ? ?Recent Labs  ?Lab 01/05/22 ?0116 01/05/22 ?1041 01/06/22 ?4696 01/06/22 ?2952 01/07/22 ?0022 01/08/22 ?0040 01/09/22 ?8413 01/10/22 ?0326  ?NA 139  --  140  --  140 136 139 138  ?K 3.3*  --  4.1  --  4.5 4.5 4.4 4.0  ?CL 103  --  106  --  107  105 106 102  ?CO2 24  --  28  --  '27 25 26 29  '$ ?GLUCOSE 128*  --  158*  --  200* 131* 131* 129*  ?BUN 21  --  13  --  22 25* 26* 22  ?CREATININE 0.97  --  1.05*  --  0.92 0.80 0.77 0.81  ?CALCIUM 9.4  --  8.9  --  9.0 8.9 9.1 8.9  ?AST 19  --   --   --   --   --   --   --   ?  ALT 21  --   --   --   --   --   --   --   ?ALKPHOS 87  --   --   --   --   --   --   --   ?BILITOT 0.2*  --   --   --   --   --   --   --   ?ALBUMIN 4.1  --   --   --   --   --   --   --   ?MG  --  1.9  --   --   --   --   --   --   ?PROCALCITON  --  0.11  --   --   --   --   --   --   ?INR 3.0*  --   --  3.1* 3.1* 2.3* 1.8* 2.0*  ?TSH  --  1.716  --   --   --   --   --   --   ?BNP 184.1*  --   --   --   --   --   --   --   ? ? ? ?------------------------------------------------------------------------------------------------------------------ ?No results for input(s): CHOL, HDL, LDLCALC, TRIG, CHOLHDL, LDLDIRECT in the last 72 hours. ? ?Lab Results  ?Component Value Date  ? HGBA1C 5.3 10/12/2016  ? ?------------------------------------------------------------------------------------------------------------------ ?No results for input(s): TSH, T4TOTAL, T3FREE, THYROIDAB in the last 72 hours. ? ?Invalid input(s): FREET3 ? ?Cardiac Enzymes ?No results for input(s): CKMB, TROPONINI, MYOGLOBIN in the last 168 hours. ? ?Invalid input(s): CK ?------------------------------------------------------------------------------------------------------------------ ?   ?Component Value Date/Time  ? BNP 184.1 (H) 01/05/2022 0116  ? ? ?CBG: ?No results for input(s): GLUCAP in the last 168 hours. ? ?Recent Results (from the past 240 hour(s))  ?MRSA Next Gen by PCR, Nasal     Status: None  ? Collection Time: 01/05/22 10:40 AM  ? Specimen: Nasal Mucosa; Nasal Swab  ?Result Value Ref Range Status  ? MRSA by PCR Next Gen NOT DETECTED NOT DETECTED Final  ?  Comment: (NOTE) ?The GeneXpert MRSA Assay (FDA approved for NASAL specimens only), ?is one component of a  comprehensive MRSA colonization surveillance ?program. It is not intended to diagnose MRSA infection nor to guide ?or monitor treatment for MRSA infections. ?Test performance is not FDA approved in patients less tha

## 2022-01-10 NOTE — Progress Notes (Signed)
? ?Progress Note ? ?Patient Name: Catherine [name] ?Date of Encounter: 01/10/2022 ? ?[facility] Cardiologist: [name], MD  ? ?Subjective  ? ?Ongoing SOB ? ?Inpatient Medications  ?  ?Scheduled Meds: ? arformoterol  15 mcg Nebulization BID  ? budesonide (PULMICORT) nebulizer solution  0.5 mg Nebulization BID  ? diazepam  5 mg Oral QHS  ? feeding supplement  237 mL Oral BID BM  ? furosemide  40 mg Intravenous BID  ? guaiFENesin  600 mg Oral BID  ? ipratropium  0.5 mg Nebulization TID  ? levalbuterol  1.25 mg Nebulization TID  ? mouth rinse  15 mL Mouth Rinse BID  ? metoprolol succinate  37.5 mg Oral Daily  ? mometasone-formoterol  2 puff Inhalation BID  ? multivitamin with minerals  1 tablet Oral Daily  ? polyethylene glycol  17 g Oral BID  ? potassium chloride  40 mEq Oral Daily  ? [START ON 01/11/2022] predniSONE  40 mg Oral Q breakfast  ? rosuvastatin  5 mg Oral Once per day on Sun Tue Thu Sat  ? sodium chloride flush  3 mL Intravenous Q12H  ? Warfarin - Pharmacist Dosing Inpatient   Does not apply L2751  ? ?Continuous Infusions: ? sodium chloride    ? ?PRN Meds: ?sodium chloride, acetaminophen, albuterol, ALPRAZolam, guaiFENesin-dextromethorphan, ondansetron (ZOFRAN) IV, sodium chloride flush  ? ?Vital Signs  ?  ?Vitals:  ? 01/10/22 0258 01/10/22 0500 01/10/22 0805 01/10/22 0820  ?BP: (!) 137/97  (!) 142/61   ?Pulse: 83   65  ?Resp: 19     ?Temp: 97.7 ?F (36.5 ?C)     ?TempSrc: Oral  Oral   ?SpO2: 92%  98%   ?Weight:  67.5 kg    ?Height:      ? ? ?Intake/Output Summary (Last 24 hours) at 01/10/2022 0956 ?Last data filed at 01/10/2022 0800 ?Gross per 24 hour  ?Intake 1027 ml  ?Output 1000 ml  ?Net 27 ml  ? ? ?  01/10/2022  ?  5:00 AM 01/09/2022  ?  3:45 AM 01/08/2022  ?  6:39 AM  ?Last 3 Weights  ?Weight (lbs) 148 lb 13 oz 151 lb 0.2 oz 152 lb 12.5 oz  ?Weight (kg) 67.5 kg 68.5 kg 69.3 kg  ?   ? ?Telemetry  ?  ?Afib variable rates - Personally Reviewed ? ?ECG  ?  ?N/a - Personally Reviewed ? ?Physical Exam   ? ?GEN: No acute distress.   ?Neck: + JVD ?Cardiac: irreg, 2/6 systolic murmur rusb ?Respiratory: Clear to auscultation bilaterally. ?GI: Soft, nontender, non-distended  ?MS: No edema; No deformity. ?Neuro:  Nonfocal  ?Psych: Normal affect  ? ?Labs  ?  ?High Sensitivity Troponin:   ?Recent Labs  ?Lab 01/05/22 ?0116 01/05/22 ?0313  ?TROPONINIHS 10 10  ?   ?Chemistry ?Recent Labs  ?Lab 01/05/22 ?0116 01/05/22 ?1041 01/06/22 ?7001 01/08/22 ?0040 01/09/22 ?7494 01/10/22 ?0326  ?NA 139  --    < > 136 139 138  ?K 3.3*  --    < > 4.5 4.4 4.0  ?CL 103  --    < > 105 106 102  ?CO2 24  --    < > '25 26 29  '$ ?GLUCOSE 128*  --    < > 131* 131* 129*  ?BUN 21  --    < > 25* 26* 22  ?CREATININE 0.97  --    < > 0.80 0.77 0.81  ?CALCIUM 9.4  --    < >  8.9 9.1 8.9  ?MG  --  1.9  --   --   --   --   ?PROT 7.1  --   --   --   --   --   ?ALBUMIN 4.1  --   --   --   --   --   ?AST 19  --   --   --   --   --   ?ALT 21  --   --   --   --   --   ?ALKPHOS 87  --   --   --   --   --   ?BILITOT 0.2*  --   --   --   --   --   ?GFRNONAA 58*  --    < > >60 >60 >60  ?ANIONGAP 12  --    < > '6 7 7  '$ ? < > = values in this interval not displayed.  ?  ?Lipids No results for input(s): CHOL, TRIG, HDL, LABVLDL, LDLCALC, CHOLHDL in the last 168 hours.  ?Hematology ?Recent Labs  ?Lab 01/08/22 ?0040 01/09/22 ?7741 01/10/22 ?0326  ?WBC 8.3 7.3 8.2  ?RBC 3.31* 4.08 4.07  ?HGB 11.4* 12.7 12.4  ?HCT 33.1* 37.3 37.2  ?MCV 100.0 91.4 91.4  ?MCH 34.4* 31.1 30.5  ?MCHC 34.4 34.0 33.3  ?RDW 14.3 13.3 13.1  ?PLT 144* 138* 161  ? ?Thyroid  ?Recent Labs  ?Lab 01/05/22 ?1041  ?TSH 1.716  ?  ?BNP ?Recent Labs  ?Lab 01/05/22 ?0116  ?BNP 184.1*  ?  ?DDimer No results for input(s): DDIMER in the last 168 hours.  ? ?Radiology  ?  ?No results found. ? ?Cardiac Studies  ? ?10/30/21 echo ?IMPRESSIONS  ? ? ? 1. Left ventricular ejection fraction, by estimation, is 60 to 65%. The  ?left ventricle has normal function. The left ventricle has no regional  ?wall motion abnormalities.  Left ventricular diastolic parameters are  ?indeterminate.  ? 2. Right ventricular systolic function is mildly reduced. The right  ?ventricular size is mildly enlarged. There is moderately elevated  ?pulmonary artery systolic pressure.  ? 3. Left atrial size was moderately dilated.  ? 4. Right atrial size was moderately dilated.  ? 5. Normal appearing bioprosthetic MVR no PVL low mean gradient at HR 58  ?bpm Stent struts protrude into LVOT but no obvious sub valvular gradients  ?Marland Kitchen The mitral valve has been repaired/replaced. No evidence of mitral valve  ?regurgitation. No evidence of  ?mitral stenosis.  ? 6. Tricuspid valve regurgitation is moderate.  ? 7. The aortic valve is tricuspid. There is moderate calcification of the  ?aortic valve. There is moderate thickening of the aortic valve. Aortic  ?valve regurgitation is not visualized. Mild aortic valve stenosis.  ? 8. The inferior vena cava is normal in size with greater than 50%  ?respiratory variability, suggesting right atrial pressure of 3 mmHg.  ? ?FINDINGS  ? Left Ventricle: Left ventricular ejection fraction, by estimation, is 60  ?to 65%. The left ventricle has normal function. The left ventricle has no  ?regional wall motion abnormalities. The left ventricular internal cavity  ?size was normal in size. There is  ? no left ventricular hypertrophy. Left ventricular diastolic parameters  ?are indeterminate.  ? ?Right Ventricle: The right ventricular size is mildly enlarged. No  ?increase in right ventricular wall thickness. Right ventricular systolic  ?function is mildly reduced. There is moderately elevated pulmonary artery  ?systolic pressure. The tricuspid  ?  regurgitant velocity is 3.29 m/s, and with an assumed right atrial  ?pressure of 15 mmHg, the estimated right ventricular systolic pressure is  ?81.2 mmHg.  ? ?Left Atrium: Left atrial size was moderately dilated.  ? ?Right Atrium: Right atrial size was moderately dilated.  ? ?Pericardium: There is no  evidence of pericardial effusion.  ? ?Mitral Valve: Normal appearing bioprosthetic MVR no PVL low mean gradient  ?at HR 58 bpm Stent struts protrude into LVOT but no obvious sub valvular  ?gradients. The mitral valve has been repaired/replaced. No evidence of  ?mitral valve regurgitation. No  ?evidence of mitral valve stenosis. MV peak gradient, 15.4 mmHg. The mean  ?mitral valve gradient is 4.0 mmHg.  ? ?Tricuspid Valve: The tricuspid valve is normal in structure. Tricuspid  ?valve regurgitation is moderate . No evidence of tricuspid stenosis.  ? ?Aortic Valve: The aortic valve is tricuspid. There is moderate  ?calcification of the aortic valve. There is moderate thickening of the  ?aortic valve. Aortic valve regurgitation is not visualized. Mild aortic  ?stenosis is present. Aortic valve mean gradient  ?measures 13.0 mmHg. Aortic valve peak gradient measures 24.2 mmHg.  ? ?Pulmonic Valve: The pulmonic valve was normal in structure. Pulmonic valve  ?regurgitation is mild. No evidence of pulmonic stenosis.  ? ?Aorta: The aortic root is normal in size and structure.  ? ?Patient Profile  ?   ?87 y.o. 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 ?Assessment & Plan  ?  ?1.Acute on chronic HFpEF ?- 10/2021 echo LVEF 60-65%, no WMAs, indet diastolic, mild RV dysfunction, mod pulm HTN, mod BAE, normal MVR, mild AS mean grad 13 AVA VTI not reported ?- I/Os incomplete this admission, from yesterday data they were even. , from admit reported neg 2.3 L. Weight over 24 hrs down 3 lsb.  REceived IV lasix '40mg'$  bid yesterday, stable renal function ?- continue IV diuresis, dose '60mg'$  bid today. Remains fluid overloaded ? ? ? ?  ?- remains fluid overloaded, LE edema and elevated JVD. Increase IV lasix to '40mg'$  bid ?  ?  ?2.COPD exacerbation ?- per primary team ?  ?3. Longstanding persistent  afib/Tachy-brady ?- varaible rates. Primarily rate controlled afib, paroxysms of RVR that are not sustained, occasional low rates. She is getting albuterol nebs, IV solumedrol with active COPD exacerbation

## 2022-01-10 NOTE — Progress Notes (Signed)
ANTICOAGULATION CONSULT NOTE - Follow Up Consult ? ?Pharmacy Consult for Warfarin ?Indication: atrial fibrillation ? ?Allergies  ?Allergen Reactions  ? Aldactone [Spironolactone] Other (See Comments)  ?  dyspnea  ? Amoxicillin Palpitations  ?  Tachycardia ?Has patient had a PCN reaction causing immediate rash, facial/tongue/throat swelling, SOB or lightheadedness with hypotension: no ?Has patient had a PCN reaction causing severe rash involving mucus membranes or skin necrosis: {no ?Has patient had a PCN reaction that required hospitalization {no ?Has patient had a PCN reaction occurring within the last 10 years: {yes ?If all of the above answers are "NO", then may proceed with Cephalosporin use.  ? Diltiazem Other (See Comments)  ?  Causing headaches ?  ? Flagyl [Metronidazole Hcl] Other (See Comments)  ?  Causing headaches ? ?  ? Flovent [Fluticasone Propionate] Other (See Comments)  ?  Leg cramps  ? Gabapentin Swelling  ? Lyrica [Pregabalin] Swelling  ? Quinidine Diarrhea and Other (See Comments)  ?  Fever diarrhea  ? Simvastatin Other (See Comments)  ?  Leg pain, myalgia  ? Tramadol Nausea Only  ? Verapamil Other (See Comments)  ?  myalgias  ? Amlodipine   ?  Low extremity edema  ? Crestor [Rosuvastatin]   ?  myalgia  ? Livalo [Pitavastatin]   ?  myalgias  ? Zetia [Ezetimibe]   ?  LEG CRAMPS ?  ? Benazepril Hcl Cough  ? Ciprofloxacin Diarrhea  ? Codeine Nausea Only  ? Nitrofurantoin Monohyd Macro Nausea Only  ? ? ?Patient Measurements: ?Height: '5\' 3"'$  (160 cm) ?Weight: 67.5 kg (148 lb 13 oz) ?IBW/kg (Calculated) : 52.4 ? ?Vital Signs: ?Temp: 98 ?F (36.7 ?C) (04/16 1240) ?Temp Source: Oral (04/16 1240) ?BP: 144/59 (04/16 1240) ?Pulse Rate: 65 (04/16 0820) ? ?Labs: ?Recent Labs  ?  01/08/22 ?0040 01/09/22 ?4462 01/10/22 ?0326  ?HGB 11.4* 12.7 12.4  ?HCT 33.1* 37.3 37.2  ?PLT 144* 138* 161  ?LABPROT 25.3* 20.7* 22.6*  ?INR 2.3* 1.8* 2.0*  ?CREATININE 0.80 0.77 0.81  ? ? ? ?Estimated Creatinine Clearance: 47.7 mL/min  (by C-G formula based on SCr of 0.81 mg/dL). ? ? ?PTA Anticoagulation: ?Home warfarin 2 mg daily except 1 mg on Wednesday and Saturday  ? ?Assessment: ?On warfarin PTA for atrial fibrillation, also has bioprosthetic MVR (2018). Received warfarin 1 mg x 1 on 4/11, warfarin resumed 4/14.  ? ?INR 2.0 today, after 2 mg dose 4/14-4/15, CBC stable.  No s/sx bleeding reported and eating well.  ? ?Warfarin PTA '1mg'$  W/Sa, '2mg'$  AOD ? ?Goal of Therapy:  ?INR 2-3 ?Monitor platelets by anticoagulation protocol: Yes ?  ?Plan:  ?Warfarin '2mg'$  today ?Check INR daily ?Monitor Hgb/Hct and plts daily  ? ?Cathrine Muster, PharmD ?PGY2 Cardiology Pharmacy Resident ?Phone: [phone] ? ?01/10/2022  2:47 PM ? ?Please check [email or web address] for unit-specific pharmacy phone numbers. ? ? ?

## 2022-01-10 NOTE — Plan of Care (Signed)
?  Problem: Activity: ?Goal: Capacity to carry out activities will improve ?Outcome: Progressing ?  ?Problem: Education: ?Goal: Knowledge of General Education information will improve ?Description: Including pain rating scale, medication(s)/side effects and non-pharmacologic comfort measures ?Outcome: Progressing ?  ?Problem: Health Behavior/Discharge Planning: ?Goal: Ability to manage health-related needs will improve ?Outcome: Progressing ?  ?

## 2022-01-11 ENCOUNTER — Ambulatory Visit: Payer: Medicare Other

## 2022-01-11 DIAGNOSIS — Z952 Presence of prosthetic heart valve: Secondary | ICD-10-CM

## 2022-01-11 DIAGNOSIS — Z7901 Long term (current) use of anticoagulants: Secondary | ICD-10-CM

## 2022-01-11 DIAGNOSIS — J9601 Acute respiratory failure with hypoxia: Secondary | ICD-10-CM | POA: Diagnosis not present

## 2022-01-11 LAB — BASIC METABOLIC PANEL
Anion gap: 8 (ref 5–15)
BUN: 23 mg/dL (ref 8–23)
CO2: 29 mmol/L (ref 22–32)
Calcium: 9 mg/dL (ref 8.9–10.3)
Chloride: 103 mmol/L (ref 98–111)
Creatinine, Ser: 0.91 mg/dL (ref 0.44–1.00)
GFR, Estimated: >60 mL/min (ref >60)
Glucose, Bld: 96 mg/dL (ref 70–99)
Potassium: 3 mmol/L — ABNORMAL LOW (ref 3.5–5.1)
Sodium: 140 mmol/L (ref 135–145)

## 2022-01-11 LAB — CBC
HCT: 39.3 % (ref 36.0–46.0)
Hemoglobin: 12.9 g/dL (ref 12.0–15.0)
MCH: 30.5 pg (ref 26.0–34.0)
MCHC: 32.8 g/dL (ref 30.0–36.0)
MCV: 92.9 fL (ref 80.0–100.0)
Platelets: 198 10*3/uL (ref 150–400)
RBC: 4.23 MIL/uL (ref 3.87–5.11)
RDW: 13.1 % (ref 11.5–15.5)
WBC: 12.6 10*3/uL — ABNORMAL HIGH (ref 4.0–10.5)
nRBC: 0 % (ref 0.0–0.2)

## 2022-01-11 LAB — BRAIN NATRIURETIC PEPTIDE: B Natriuretic Peptide: 226.4 pg/mL — ABNORMAL HIGH (ref 0.0–100.0)

## 2022-01-11 LAB — PROTIME-INR
INR: 2.2 — ABNORMAL HIGH (ref 0.8–1.2)
Prothrombin Time: 24.3 seconds — ABNORMAL HIGH (ref 11.4–15.2)

## 2022-01-11 LAB — MAGNESIUM: Magnesium: 2.6 mg/dL — ABNORMAL HIGH (ref 1.7–2.4)

## 2022-01-11 MED ORDER — METOPROLOL SUCCINATE ER 25 MG PO TB24
37.5000 mg | ORAL_TABLET | Freq: Every day | ORAL | Status: DC
  Administered 2022-01-12: 37.5 mg via ORAL
  Filled 2022-01-11: qty 2

## 2022-01-11 MED ORDER — FUROSEMIDE 10 MG/ML IJ SOLN
60.0000 mg | Freq: Once | INTRAMUSCULAR | Status: AC
Start: 2022-01-11 — End: 2022-01-11
  Administered 2022-01-11: 60 mg via INTRAVENOUS
  Filled 2022-01-11: qty 6

## 2022-01-11 MED ORDER — WARFARIN SODIUM 2 MG PO TABS
2.0000 mg | ORAL_TABLET | Freq: Once | ORAL | Status: AC
  Administered 2022-01-11: 2 mg via ORAL
  Filled 2022-01-11: qty 1

## 2022-01-11 MED ORDER — LOSARTAN POTASSIUM 25 MG PO TABS
12.5000 mg | ORAL_TABLET | Freq: Every day | ORAL | Status: DC
  Administered 2022-01-11 – 2022-01-12 (×2): 12.5 mg via ORAL
  Filled 2022-01-11 (×2): qty 1

## 2022-01-11 MED ORDER — POTASSIUM CHLORIDE CRYS ER 20 MEQ PO TBCR
40.0000 meq | EXTENDED_RELEASE_TABLET | Freq: Once | ORAL | Status: AC
  Administered 2022-01-11: 40 meq via ORAL
  Filled 2022-01-11: qty 2

## 2022-01-11 NOTE — Progress Notes (Signed)
Physical Therapy Treatment ?Patient Details ?Name: Catherine [name] ?MRN: [id number] ?DOB: Nov 01, 1934 ?Today's Date: 01/11/2022 ? ? ?History of Present Illness Pt is a 87 y.o. F who presents 01/05/2022 with complaints of progressively worsening shortness of breath over the last 4 days. Admitted with acute respiratory failure with hypoxia secondary to acute on chronic COPD and diastolic congestive heart failure exacerbation. Significant PMH: chronic diastolic CHF, moderate triscuspid regurg, CAD s/p CABG, rheumatic heart disease, chronic atrial fibrillation on Coumadin, CKD stage III. ? ?  ?PT Comments  ? ? Patient progressing well towards PT goals. Reports breathing and strength seem to be improving. Session focused on higher level balance activities, functional mobility and ambulation. Scored 14/24 on the DGI, which is a 3 point improvement from last week. Performed 5xSTS in 18.24 sec (>15 secs means an increased fall risk). VSS on RA throughout. Continues to have mild balance deficits. Motivated to mobilize and wants to be able to return to [place] at d/c. Will follow. ?  ?Recommendations for follow up therapy are one component of a multi-disciplinary discharge planning process, led by the attending physician.  Recommendations may be updated based on patient status, additional functional criteria and insurance authorization. ? ?Follow Up Recommendations ? No PT follow up ?  ?  ?Assistance Recommended at Discharge PRN  ?Patient can return home with the following Assistance with cooking/housework;Help with stairs or ramp for entrance ?  ?Equipment Recommendations ?    ?  ?Recommendations for Other Services   ? ? ?  ?Precautions / Restrictions Precautions ?Precautions: Fall ?Precaution Comments: watch O2 and HR ?Restrictions ?Weight Bearing Restrictions: No  ?  ? ?Mobility ? Bed Mobility ?  ?  ?  ?  ?  ?  ?  ?General bed mobility comments: Up in chair upon PT arrival. ?  ? ?Transfers ?Overall transfer level:  Modified independent ?Equipment used: None ?  ?  ?  ?  ?  ?  ?  ?General transfer comment: Stood from chair x6 ?  ? ?Ambulation/Gait ?Ambulation/Gait assistance: Min guard ?Gait Distance (Feet): 300 Feet ?Assistive device: None ?Gait Pattern/deviations: Step-through pattern, Decreased stride length ?Gait velocity: decreased ?  ?  ?General Gait Details: Slow, mostly steady gait with some drifting noted esp with higher level balance activities, close Min guard for safety. ? ? ?Stairs ?  ?  ?  ?  ?  ? ? ?Wheelchair Mobility ?  ? ?Modified Rankin (Stroke Patients Only) ?  ? ? ?  ?Balance Overall balance assessment: Needs assistance ?Sitting-balance support: Feet supported, No upper extremity supported ?Sitting balance-Leahy Scale: Good ?  ?  ?Standing balance support: During functional activity ?Standing balance-Leahy Scale: Fair ?  ?Single Leg Stance - Right Leg: 1 ?Single Leg Stance - Left Leg: 2 ?Tandem Stance - Right Leg: 10 ?Tandem Stance - Left Leg: 13 ?Rhomberg - Eyes Opened: 25 ?Rhomberg - Eyes Closed: 20 (sway noted) ?High level balance activites: Backward walking, Direction changes, Turns, Sudden stops, Head turns ?High Level Balance Comments: Able to walk backwards which decreased gait speed and mild deviations in gait with head turns and changes in direction. ?Standardized Balance Assessment ?Standardized Balance Assessment : Dynamic Gait Index ?  ?Dynamic Gait Index ?Level Surface: Mild Impairment ?Change in Gait Speed: Moderate Impairment ?Gait with Horizontal Head Turns: Mild Impairment ?Gait with Vertical Head Turns: Mild Impairment ?Gait and Pivot Turn: Mild Impairment ?Step Over Obstacle: Mild Impairment ?Step Around Obstacles: Mild Impairment ?Steps: Moderate Impairment ?Total Score: 14 ?  ? ?  ?  Cognition Arousal/Alertness: Awake/alert ?  ?Overall Cognitive Status: Within Functional Limits for tasks assessed ?  ?  ?  ?  ?  ?  ?  ?  ?  ?  ?  ?  ?  ?  ?  ?  ?General Comments: mild short term memory  deficits ?  ?  ? ?  ?Exercises Other Exercises ?Other Exercises: 5xSTS in 18.24 sec (>15, increased fall risk) ? ?  ?General Comments General comments (skin integrity, edema, etc.): Sp02 remained >90% on RA with activity. ?  ?  ? ?Pertinent Vitals/Pain Pain Assessment ?Pain Assessment: Faces ?Faces Pain Scale: Hurts little more ?Pain Location: chronic back ?Pain Descriptors / Indicators: Discomfort ?Pain Intervention(s): Monitored during session, Repositioned  ? ? ?Home Living   ?  ?  ?  ?  ?  ?  ?  ?  ?  ?   ?  ?Prior Function    ?  ?  ?   ? ?PT Goals (current goals can now be found in the care plan section) Progress towards PT goals: Progressing toward goals ? ?  ?Frequency ? ? ? Min 3X/week ? ? ? ?  ?PT Plan Current plan remains appropriate  ? ? ?Co-evaluation   ?  ?  ?  ?  ? ?  ?AM-PAC PT "6 Clicks" Mobility   ?Outcome Measure ? Help needed turning from your back to your side while in a flat bed without using bedrails?: None ?Help needed moving from lying on your back to sitting on the side of a flat bed without using bedrails?: None ?Help needed moving to and from a bed to a chair (including a wheelchair)?: None ?Help needed standing up from a chair using your arms (e.g., wheelchair or bedside chair)?: None ?Help needed to walk in hospital room?: A Little ?Help needed climbing 3-5 steps with a railing? : A Little ?6 Click Score: 22 ? ?  ?End of Session Equipment Utilized During Treatment: Gait belt ?Activity Tolerance: Patient tolerated treatment well ?Patient left: in chair;with call bell/phone within reach ?Nurse Communication: Mobility status ?PT Visit Diagnosis: Unsteadiness on feet (R26.81);Difficulty in walking, not elsewhere classified (R26.2) ?  ? ? ?Time: 0354-6568 ?PT Time Calculation (min) (ACUTE ONLY): 30 min ? ?Charges:  $Gait Training: 8-22 mins ?$Neuromuscular Re-education: 8-22 mins          ?          ? ?Marisa Severin, PT, DPT ?Acute Rehabilitation Services ?Secure chat preferred ?Office  [phone] ? ? ? ? ? ?[place] ?01/11/2022, 3:47 PM ? ?

## 2022-01-11 NOTE — Progress Notes (Signed)
ANTICOAGULATION CONSULT NOTE - Follow Up Consult ? ?Pharmacy Consult for Warfarin ?Indication: atrial fibrillation ? ?Allergies  ?Allergen Reactions  ? Aldactone [Spironolactone] Other (See Comments)  ?  dyspnea  ? Amoxicillin Palpitations  ?  Tachycardia ?Has patient had a PCN reaction causing immediate rash, facial/tongue/throat swelling, SOB or lightheadedness with hypotension: no ?Has patient had a PCN reaction causing severe rash involving mucus membranes or skin necrosis: {no ?Has patient had a PCN reaction that required hospitalization {no ?Has patient had a PCN reaction occurring within the last 10 years: {yes ?If all of the above answers are "NO", then may proceed with Cephalosporin use.  ? Diltiazem Other (See Comments)  ?  Causing headaches ?  ? Flagyl [Metronidazole Hcl] Other (See Comments)  ?  Causing headaches ? ?  ? Flovent [Fluticasone Propionate] Other (See Comments)  ?  Leg cramps  ? Gabapentin Swelling  ? Lyrica [Pregabalin] Swelling  ? Quinidine Diarrhea and Other (See Comments)  ?  Fever diarrhea  ? Simvastatin Other (See Comments)  ?  Leg pain, myalgia  ? Tramadol Nausea Only  ? Verapamil Other (See Comments)  ?  myalgias  ? Amlodipine   ?  Low extremity edema  ? Crestor [Rosuvastatin]   ?  myalgia  ? Livalo [Pitavastatin]   ?  myalgias  ? Zetia [Ezetimibe]   ?  LEG CRAMPS ?  ? Benazepril Hcl Cough  ? Ciprofloxacin Diarrhea  ? Codeine Nausea Only  ? Nitrofurantoin Monohyd Macro Nausea Only  ? ? ?Patient Measurements: ?Height: '5\' 3"'$  (160 cm) ?Weight: 66.1 kg (145 lb 11.6 oz) ?IBW/kg (Calculated) : 52.4 ? ?Vital Signs: ?Temp: 98.7 ?F (37.1 ?C) (04/17 0729) ?Temp Source: Oral (04/17 0729) ?BP: 184/59 (04/17 0729) ?Pulse Rate: 61 (04/17 0729) ? ?Labs: ?Recent Labs  ?  01/09/22 ?7414 01/10/22 ?0326 01/11/22 ?0120  ?HGB 12.7 12.4 12.9  ?HCT 37.3 37.2 39.3  ?PLT 138* 161 198  ?LABPROT 20.7* 22.6* 24.3*  ?INR 1.8* 2.0* 2.2*  ?CREATININE 0.77 0.81 0.91  ? ? ? ?Estimated Creatinine Clearance: 42.1  mL/min (by C-G formula based on SCr of 0.91 mg/dL). ? ? ? ?Assessment: ?54 yoF admitted with CHF/COPD exacerbation. Pt on warfarin PTA for hx AF and bMVR. INR therapeutic at 2.2, CBC stable. ? ?Home warfarin dose: '1mg'$  W/Sa, '2mg'$  AOD ? ?Goal of Therapy:  ?INR 2-3 ?Monitor platelets by anticoagulation protocol: Yes ?  ?Plan:  ?Warfarin '2mg'$  today ?Check INR daily ?Monitor Hgb/Hct and plts daily  ? ?Arrie Senate, PharmD, BCPS, BCCP ?Clinical Pharmacist ?[phone] ?Please check AMION for all [place] numbers ?01/11/2022 ? ? ?

## 2022-01-11 NOTE — Progress Notes (Signed)
?PROGRESS NOTE ? ?Catherine [name] JZP:[id number] DOB: 02-02-1935 DOA: 01/05/2022 ?PCP: [name], MD ? ? LOS: 6 days  ? ?Brief Narrative / Interim history: ?This is an 87 year old female with chronic diastolic CHF on torsemide, A-fib on Coumadin, COPD, CAD, bioprosthetic MVR, CABG x1, history of maze, comes into the hospital with progressive dyspnea on exertion as well as weight gain and peripheral edema.  Given wheezing on admission she was started on nebulizers along with steroids, as well as diuretics for her fluid overload.  Cardiology was consulted. ? ?Subjective / 24h Interval events: ?Complains of persistent weakness and shortness of breath with activities. ? ?Assesement and Plan: ?Principal Problem: ?  Acute respiratory failure with hypoxia  secondary to acute on chronic COPD and diastolic congestive heart failure exacerbation ?Active Problems: ?  Atrial fibrillation ([place]) ?  Hypertension ?  Chronic kidney disease ?  Coronary artery disease ?  Anxiety ? ?Assessment and Plan: ?Principal problem ?Acute respiratory failure with hypoxia  secondary to acute on chronic COPD and diastolic congestive heart failure exacerbation -cardiology consulted, appreciate input.  Continue intravenous furosemide.  Critical status overall improving however she still gets dyspneic with activities.  Continue nebulizers, steroids.  Wheezing is resolved, will taper off steroids tomorrow.  Patient reports some dyspnea for several months, and weakness has been persistent, suspect also multifactorial in the setting of deconditioning, COPD, diastolic CHF, A-fib. ? ?Active problems ?Atrial fibrillation, intermittent RVR-Continue heart rate control with metoprolol and anticoagulation with Coumadin.  Currently on metoprolol 35.7 twice daily.  Rates overall stable ? ?Hypertension -Continue metoprolol as below, on furosemide.  Blood pressure still on the high side, appreciate cardiology follow-up ? ?CKD 3a -Cr stable with  diuresis ? ?Coronary artery disease -on aspirin, statin, no chest pain or ACS type features ? ?Dyslipidemia- continue with rosuvastatin.   ? ?Anxiety -Continue with diazepam at night and during the day will add as needed alprazolam.  ? ? ?Scheduled Meds: ? arformoterol  15 mcg Nebulization BID  ? budesonide (PULMICORT) nebulizer solution  0.5 mg Nebulization BID  ? diazepam  5 mg Oral QHS  ? feeding supplement  237 mL Oral BID BM  ? furosemide  60 mg Intravenous Once  ? guaiFENesin  600 mg Oral BID  ? ipratropium  0.5 mg Nebulization TID  ? levalbuterol  1.25 mg Nebulization TID  ? losartan  12.5 mg Oral Daily  ? mouth rinse  15 mL Mouth Rinse BID  ? [START ON 01/12/2022] metoprolol succinate  37.5 mg Oral Daily  ? mometasone-formoterol  2 puff Inhalation BID  ? multivitamin with minerals  1 tablet Oral Daily  ? polyethylene glycol  17 g Oral BID  ? potassium chloride  40 mEq Oral Daily  ? predniSONE  40 mg Oral Q breakfast  ? rosuvastatin  5 mg Oral Once per day on Sun Tue Thu Sat  ? sodium chloride flush  3 mL Intravenous Q12H  ? warfarin  2 mg Oral ONCE-1600  ? Warfarin - Pharmacist Dosing Inpatient   Does not apply Y8016  ? ?Continuous Infusions: ? sodium chloride    ? ?PRN Meds:.sodium chloride, acetaminophen, albuterol, ALPRAZolam, guaiFENesin-dextromethorphan, ondansetron (ZOFRAN) IV, sodium chloride flush ? ?Diet Orders (From admission, onward)  ? ?  Start     Ordered  ? 01/05/22 1108  Diet Heart Room service appropriate? Yes; Fluid consistency: Thin; Fluid restriction: 1800 mL Fluid  Diet effective now       ?Question Answer Comment  ?Room service  appropriate? Yes   ?Fluid consistency: Thin   ?Fluid restriction: 1800 mL Fluid   ?  ? 01/05/22 1107  ? ?  ?  ? ?  ? ? ?DVT prophylaxis: Place TED hose Start: 01/06/22 0816 ?warfarin (COUMADIN) tablet 2 mg  ? ?Lab Results  ?Component Value Date  ? PLT 198 01/11/2022  ? ? ?  Code Status: Full Code ? ?Family Communication: Updated husband over the phone ? ?Status is:  Inpatient ?Remains inpatient appropriate because: IV diuresis ? ?Level of care: Telemetry Cardiac ? ?Consultants:  ?Cardiology ? ?Procedures:  ?none ? ?Microbiology  ?none ? ?Antimicrobials: ?none  ? ? ?Objective: ?Vitals:  ? 01/11/22 0346 01/11/22 0520 01/11/22 0729 01/11/22 1610  ?BP: (!) 171/80  (!) 184/59   ?Pulse: 72  61   ?Resp: 16  16   ?Temp: 98.2 ?F (36.8 ?C)  98.7 ?F (37.1 ?C)   ?TempSrc: Oral  Oral   ?SpO2: 91%  92% 94%  ?Weight:  66.1 kg    ?Height:      ? ? ?Intake/Output Summary (Last 24 hours) at 01/11/2022 1338 ?Last data filed at 01/11/2022 0700 ?Gross per 24 hour  ?Intake 1080 ml  ?Output 1650 ml  ?Net -570 ml  ? ? ?Wt Readings from Last 3 Encounters:  ?01/11/22 66.1 kg  ?12/21/21 69.2 kg  ?11/30/21 66.7 kg  ? ? ?Examination: ? ?Constitutional: NAD ?Eyes: lids and conjunctivae normal, no scleral icterus ?ENMT: mmm ?Neck: normal, supple ?Respiratory: No wheezing heard.  Diminished at the bases. ?Cardiovascular: Regular rate and rhythm, no murmurs / rubs / gallops.  Trace edema ?Abdomen: soft, no distention, no tenderness. Bowel sounds positive.  ?Skin: no rashes ?Neurologic: no focal deficits, equal strength ? ? ?Data Reviewed: I have independently reviewed following labs and imaging studies ? ?CBC ?Recent Labs  ?Lab 01/05/22 ?0116 01/06/22 ?9604 01/07/22 ?0022 01/08/22 ?0040 01/09/22 ?5409 01/10/22 ?0326 01/11/22 ?0120  ?WBC 6.7   < > 8.1 8.3 7.3 8.2 12.6*  ?HGB 12.6   < > 11.5* 11.4* 12.7 12.4 12.9  ?HCT 38.5   < > 35.5* 33.1* 37.3 37.2 39.3  ?PLT 128*   < > 133* 144* 138* 161 198  ?MCV 93.4   < > 95.4 100.0 91.4 91.4 92.9  ?MCH 30.6   < > 30.9 34.4* 31.1 30.5 30.5  ?MCHC 32.7   < > 32.4 34.4 34.0 33.3 32.8  ?RDW 13.2   < > 13.4 14.3 13.3 13.1 13.1  ?LYMPHSABS 0.7  --   --   --   --   --   --   ?MONOABS 0.6  --   --   --   --   --   --   ?EOSABS 0.1  --   --   --   --   --   --   ?BASOSABS 0.0  --   --   --   --   --   --   ? < > = values in this interval not displayed.  ? ? ? ?Recent Labs  ?Lab  01/05/22 ?0116 01/05/22 ?1041 01/06/22 ?8119 01/07/22 ?0022 01/08/22 ?0040 01/09/22 ?1478 01/10/22 ?0326 01/11/22 ?0120  ?NA 139  --    < > 140 136 139 138 140  ?K 3.3*  --    < > 4.5 4.5 4.4 4.0 3.0*  ?CL 103  --    < > 107 105 106 102 103  ?CO2 24  --    < > 27  $'25 26 29 29  'M$ ?GLUCOSE 128*  --    < > 200* 131* 131* 129* 96  ?BUN 21  --    < > 22 25* 26* 22 23  ?CREATININE 0.97  --    < > 0.92 0.80 0.77 0.81 0.91  ?CALCIUM 9.4  --    < > 9.0 8.9 9.1 8.9 9.0  ?AST 19  --   --   --   --   --   --   --   ?ALT 21  --   --   --   --   --   --   --   ?ALKPHOS 87  --   --   --   --   --   --   --   ?BILITOT 0.2*  --   --   --   --   --   --   --   ?ALBUMIN 4.1  --   --   --   --   --   --   --   ?MG  --  1.9  --   --   --   --   --  2.6*  ?PROCALCITON  --  0.11  --   --   --   --   --   --   ?INR 3.0*  --    < > 3.1* 2.3* 1.8* 2.0* 2.2*  ?TSH  --  1.716  --   --   --   --   --   --   ?BNP 184.1*  --   --   --   --   --   --  226.4*  ? < > = values in this interval not displayed.  ? ? ? ?------------------------------------------------------------------------------------------------------------------ ?No results for input(s): CHOL, HDL, LDLCALC, TRIG, CHOLHDL, LDLDIRECT in the last 72 hours. ? ?Lab Results  ?Component Value Date  ? HGBA1C 5.3 10/12/2016  ? ?------------------------------------------------------------------------------------------------------------------ ?No results for input(s): TSH, T4TOTAL, T3FREE, THYROIDAB in the last 72 hours. ? ?Invalid input(s): FREET3 ? ?Cardiac Enzymes ?No results for input(s): CKMB, TROPONINI, MYOGLOBIN in the last 168 hours. ? ?Invalid input(s): CK ?------------------------------------------------------------------------------------------------------------------ ?   ?Component Value Date/Time  ? BNP 226.4 (H) 01/11/2022 0120  ? ? ?CBG: ?No results for input(s): GLUCAP in the last 168 hours. ? ?Recent Results (from the past 240 hour(s))  ?MRSA Next Gen by PCR, Nasal     Status:  None  ? Collection Time: 01/05/22 10:40 AM  ? Specimen: Nasal Mucosa; Nasal Swab  ?Result Value Ref Range Status  ? MRSA by PCR Next Gen NOT DETECTED NOT DETECTED Final  ?  Comment: (NOTE) ?The GeneXpert MRSA Assay (FD

## 2022-01-11 NOTE — Plan of Care (Signed)
?  Problem: Education: ?Goal: Ability to demonstrate management of disease process will improve ?Outcome: Progressing ?Goal: Ability to verbalize understanding of medication therapies will improve ?Outcome: Progressing ?  ?Problem: Cardiac: ?Goal: Ability to achieve and maintain adequate cardiopulmonary perfusion will improve ?Outcome: Progressing ?  ?Problem: Clinical Measurements: ?Goal: Cardiovascular complication will be avoided ?Outcome: Progressing ?  ?Problem: Activity: ?Goal: Risk for activity intolerance will decrease ?Outcome: Progressing ?  ?

## 2022-01-11 NOTE — Progress Notes (Signed)
?  Mobility Specialist Criteria Algorithm Info. ? ? 01/11/22 1430  ?Mobility  ?Activity Ambulated with assistance in hallway ?(In chair before and after ambulation)  ?Range of Motion/Exercises Active;All extremities  ?Level of Assistance Standby assist, set-up cues, supervision of patient - no hands on  ?Assistive Device Front wheel walker  ?Distance Ambulated (ft) 480 ft  ?Activity Response Tolerated well  ? ?Patient received in chair eager to participate in mobility. Ambulated in hallway supervision level with slow gait. Completed education on energy conservation and pursed lip breathing. Oxygen saturated >89% throughout ambulation. Returned to room without complaint or incident. Was left in recliner chair with all needs met, call bell in reach. ? ?01/11/2022 ?4:02 PM ? ?[place] Crystle Carelli, CMS, BS EXP ?Acute Rehabilitation Services  ?ZGFUQ:[phone] ?Office: [phone] ? ?

## 2022-01-11 NOTE — Progress Notes (Addendum)
? ?Progress Note ? ?Patient Name: Catherine [name] ?Date of Encounter: 01/11/2022 ? ?[facility] Cardiologist: [name], MD  ? ?Subjective  ? ?Patient feels breathing has improved, but she continues to have a cough. Denies any orthopnea, chest pain, dizziness. Does have some palpitations.  ? ?Inpatient Medications  ?  ?Scheduled Meds: ? arformoterol  15 mcg Nebulization BID  ? budesonide (PULMICORT) nebulizer solution  0.5 mg Nebulization BID  ? diazepam  5 mg Oral QHS  ? feeding supplement  237 mL Oral BID BM  ? furosemide  60 mg Intravenous BID  ? guaiFENesin  600 mg Oral BID  ? ipratropium  0.5 mg Nebulization TID  ? levalbuterol  1.25 mg Nebulization TID  ? mouth rinse  15 mL Mouth Rinse BID  ? metoprolol succinate  50 mg Oral Daily  ? mometasone-formoterol  2 puff Inhalation BID  ? multivitamin with minerals  1 tablet Oral Daily  ? polyethylene glycol  17 g Oral BID  ? potassium chloride  40 mEq Oral Daily  ? predniSONE  40 mg Oral Q breakfast  ? rosuvastatin  5 mg Oral Once per day on Sun Tue Thu Sat  ? sodium chloride flush  3 mL Intravenous Q12H  ? Warfarin - Pharmacist Dosing Inpatient   Does not apply N2778  ? ?Continuous Infusions: ? sodium chloride    ? ?PRN Meds: ?sodium chloride, acetaminophen, albuterol, ALPRAZolam, guaiFENesin-dextromethorphan, ondansetron (ZOFRAN) IV, sodium chloride flush  ? ?Vital Signs  ?  ?Vitals:  ? 01/10/22 2333 01/11/22 0346 01/11/22 0520 01/11/22 0729  ?BP: (!) 145/54 (!) 171/80  (!) 184/59  ?Pulse: 64 72  61  ?Resp: '17 16  16  '$ ?Temp: 98.2 ?F (36.8 ?C) 98.2 ?F (36.8 ?C)  98.7 ?F (37.1 ?C)  ?TempSrc: Oral Oral  Oral  ?SpO2: 90% 91%  92%  ?Weight:   66.1 kg   ?Height:      ? ? ?Intake/Output Summary (Last 24 hours) at 01/11/2022 0833 ?Last data filed at 01/11/2022 0700 ?Gross per 24 hour  ?Intake 1080 ml  ?Output 1650 ml  ?Net -570 ml  ? ? ?  01/11/2022  ?  5:20 AM 01/10/2022  ?  5:00 AM 01/09/2022  ?  3:45 AM  ?Last 3 Weights  ?Weight (lbs) 145 lb 11.6 oz 148 lb 13 oz 151  lb 0.2 oz  ?Weight (kg) 66.1 kg 67.5 kg 68.5 kg  ?   ? ?Telemetry  ?  ?Atrial fibrillation, HR in the 40s-50s with occasional 2-2.4 second pauses - Personally Reviewed ? ?ECG  ?  ?No new tracings since 4/12 - Personally Reviewed ? ?Physical Exam  ? ?GEN: No acute distress.  Laying comfortably in the bed  ?Neck: No JVD ?Cardiac: Irregular rate and rhythm. 1/6 systolic murmur best heard at RUSB ?Respiratory: Diffuse inspiratory and expiratory wheezing  ?GI: Soft, nontender, mild distention  ?MS: Trace edema; No deformity. ?Neuro:  Nonfocal  ?Psych: Normal affect  ? ?Labs  ?  ?High Sensitivity Troponin:   ?Recent Labs  ?Lab 01/05/22 ?0116 01/05/22 ?0313  ?TROPONINIHS 10 10  ?   ?Chemistry ?Recent Labs  ?Lab 01/05/22 ?0116 01/05/22 ?1041 01/06/22 ?2423 01/09/22 ?5361 01/10/22 ?0326 01/11/22 ?0120  ?NA 139  --    < > 139 138 140  ?K 3.3*  --    < > 4.4 4.0 3.0*  ?CL 103  --    < > 106 102 103  ?CO2 24  --    < >  $'26 29 29  'p$ ?GLUCOSE 128*  --    < > 131* 129* 96  ?BUN 21  --    < > 26* 22 23  ?CREATININE 0.97  --    < > 0.77 0.81 0.91  ?CALCIUM 9.4  --    < > 9.1 8.9 9.0  ?MG  --  1.9  --   --   --  2.6*  ?PROT 7.1  --   --   --   --   --   ?ALBUMIN 4.1  --   --   --   --   --   ?AST 19  --   --   --   --   --   ?ALT 21  --   --   --   --   --   ?ALKPHOS 87  --   --   --   --   --   ?BILITOT 0.2*  --   --   --   --   --   ?GFRNONAA 58*  --    < > >60 >60 >60  ?ANIONGAP 12  --    < > '7 7 8  '$ ? < > = values in this interval not displayed.  ?  ?Lipids No results for input(s): CHOL, TRIG, HDL, LABVLDL, LDLCALC, CHOLHDL in the last 168 hours.  ?Hematology ?Recent Labs  ?Lab 01/09/22 ?8119 01/10/22 ?0326 01/11/22 ?0120  ?WBC 7.3 8.2 12.6*  ?RBC 4.08 4.07 4.23  ?HGB 12.7 12.4 12.9  ?HCT 37.3 37.2 39.3  ?MCV 91.4 91.4 92.9  ?MCH 31.1 30.5 30.5  ?MCHC 34.0 33.3 32.8  ?RDW 13.3 13.1 13.1  ?PLT 138* 161 198  ? ?Thyroid  ?Recent Labs  ?Lab 01/05/22 ?1041  ?TSH 1.716  ?  ?BNP ?Recent Labs  ?Lab 01/05/22 ?0116 01/11/22 ?0120  ?BNP 184.1*  226.4*  ?  ?DDimer No results for input(s): DDIMER in the last 168 hours.  ? ?Radiology  ?  ?No results found. ? ?Cardiac Studies  ? ?10/30/21 echo ?IMPRESSIONS  ? ? ? 1. Left ventricular ejection fraction, by estimation, is 60 to 65%. The  ?left ventricle has normal function. The left ventricle has no regional  ?wall motion abnormalities. Left ventricular diastolic parameters are  ?indeterminate.  ? 2. Right ventricular systolic function is mildly reduced. The right  ?ventricular size is mildly enlarged. There is moderately elevated  ?pulmonary artery systolic pressure.  ? 3. Left atrial size was moderately dilated.  ? 4. Right atrial size was moderately dilated.  ? 5. Normal appearing bioprosthetic MVR no PVL low mean gradient at HR 58  ?bpm Stent struts protrude into LVOT but no obvious sub valvular gradients  ?Marland Kitchen The mitral valve has been repaired/replaced. No evidence of mitral valve  ?regurgitation. No evidence of  ?mitral stenosis.  ? 6. Tricuspid valve regurgitation is moderate.  ? 7. The aortic valve is tricuspid. There is moderate calcification of the  ?aortic valve. There is moderate thickening of the aortic valve. Aortic  ?valve regurgitation is not visualized. Mild aortic valve stenosis.  ? 8. The inferior vena cava is normal in size with greater than 50%  ?respiratory variability, suggesting right atrial pressure of 3 mmHg.  ? ?FINDINGS  ? Left Ventricle: Left ventricular ejection fraction, by estimation, is 60  ?to 65%. The left ventricle has normal function. The left ventricle has no  ?regional wall motion abnormalities. The left ventricular internal cavity  ?size was normal in size.  There is  ? no left ventricular hypertrophy. Left ventricular diastolic parameters  ?are indeterminate.  ? ?Right Ventricle: The right ventricular size is mildly enlarged. No  ?increase in right ventricular wall thickness. Right ventricular systolic  ?function is mildly reduced. There is moderately elevated pulmonary artery   ?systolic pressure. The tricuspid  ?regurgitant velocity is 3.29 m/s, and with an assumed right atrial  ?pressure of 15 mmHg, the estimated right ventricular systolic pressure is  ?03.5 mmHg.  ? ?Left Atrium: Left atrial size was moderately dilated.  ? ?Right Atrium: Right atrial size was moderately dilated.  ? ?Pericardium: There is no evidence of pericardial effusion.  ? ?Mitral Valve: Normal appearing bioprosthetic MVR no PVL low mean gradient  ?at HR 58 bpm Stent struts protrude into LVOT but no obvious sub valvular  ?gradients. The mitral valve has been repaired/replaced. No evidence of  ?mitral valve regurgitation. No  ?evidence of mitral valve stenosis. MV peak gradient, 15.4 mmHg. The mean  ?mitral valve gradient is 4.0 mmHg.  ? ?Tricuspid Valve: The tricuspid valve is normal in structure. Tricuspid  ?valve regurgitation is moderate . No evidence of tricuspid stenosis.  ? ?Aortic Valve: The aortic valve is tricuspid. There is moderate  ?calcification of the aortic valve. There is moderate thickening of the  ?aortic valve. Aortic valve regurgitation is not visualized. Mild aortic  ?stenosis is present. Aortic valve mean gradient  ?measures 13.0 mmHg. Aortic valve peak gradient measures 24.2 mmHg.  ? ?Pulmonic Valve: The pulmonic valve was normal in structure. Pulmonic valve  ?regurgitation is mild. No evidence of pulmonic stenosis.  ? ?Aorta: The aortic root is normal in size and structure.  ? ?Patient Profile  ?   ?87 y.o. 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 ?Assessment & Plan  ?  ?Acute on Chronic HFpEF  ?- Most recent echocardiogram on 10/30/21 showed LVEF 60-65%, no WMAs, indeterminate diastolic function, mild RV dysfunction, moderate pulmonary HTN, moderate BAE, normal MVR, mild AS mean grad 13 AVA VTI not reported ?- Currently on IV lasix  60 mg BID. Output 1.65 L urine yesterday and is currently net -2.534 L since admission. Weight down to 145.72 lbs from 152 on admission  ?- Creatinine stable at 0.91  ?- K 3.0-- on K 40 mEq daily per

## 2022-01-11 NOTE — Progress Notes (Signed)
Heart Failure Stewardship Pharmacist Progress Note ? ? ?PCP: [name], MD ?PCP-Cardiologist: [name], MD  ? ? ?HPI:  ?87 yo F with PMH of CHF, CAD s/p CABG, rheumatic heart disease, afib, history of frequent UTIs, and CKD. She presented to the ED on 4/11 with shortness of breath, LE edema, and orthopnea. CXR with possible mild interstitial edema. Her last ECHO was done 10/30/21 and LVEF was 60-65% with mildly reduced RV. ? ?Current HF Medications: ?Diuretic: furosemide 60 mg IV BID ?Beta Blocker: metoprolol XL 37.5 mg daily ? ?Prior to admission HF Medications: ?Diuretic: torsemide 40 mg BID ?Beta blocker: metoprolol XL 25 mg daily ? ?Pertinent Lab Values: ?Serum creatinine 0.80, BUN 25, Potassium 4.5, Sodium 136, BNP 184.1 ? ?Vital Signs: ?Weight: 152 lbs (admission weight: 150 lbs) ?Blood pressure: 140-180/60s  ?Heart rate: 50s ?I/O: -1.5L yesterday; net -2.5L ? ?Medication Assistance / Insurance Benefits Check: ?Does the patient have prescription insurance?  Yes ?Type of insurance plan: [facility] Medicare ? ?Outpatient Pharmacy:  ?Prior to admission outpatient pharmacy: [facility] ?Is the patient willing to use [facility] pharmacy at discharge? Yes ?Is the patient willing to transition their outpatient pharmacy to utilize a [facility] outpatient pharmacy?   Pending ?  ? ?Assessment: ?1. Acute on chronic diastolic CHF (EF 74-08%). NYHA class III symptoms. ?- Continue furosemide 60 mg IV BID - plan to transition to PO tomorrow ?- Continue metoprolol XL 37.5 mg daily ?- Intolerance to benazepril - caused cough. Agree with starting losartan 12.5 mg daily ?- Intolerance to spironolactone - caused dyspnea ?- Caution SGLT2i with history of UTIs ?  ?Plan: ?1) Medication changes recommended at this time: ?- Agree with changes as above  ? ?2) Patient assistance: ?- None pending ? ?3)  Education  ?- To be completed prior to discharge ? ?Kerby Nora, PharmD, BCPS ?Heart Failure Stewardship Pharmacist ?Phone  [phone] ? ? ?

## 2022-01-11 NOTE — Progress Notes (Signed)
Occupational Therapy Treatment ?Patient Details ?Name: Catherine [name] ?MRN: [id number] ?DOB: 1935/02/18 ?Today's Date: 01/11/2022 ? ? ?History of present illness Pt is a 87 y.o. F who presents 01/05/2022 with complaints of progressively worsening shortness of breath over the last 4 days. Admitted with acute respiratory failure with hypoxia secondary to acute on chronic COPD and diastolic congestive heart failure exacerbation. Significant PMH: chronic diastolic CHF, moderate triscuspid regurg, CAD s/p CABG, rheumatic heart disease, chronic atrial fibrillation on Coumadin, CKD stage III. ?  ?OT comments ? Patient continues to make steady progress towards goals in skilled OT session. Patient's session encompassed  grooming tasks at sink, toileting, and functional mobility in hallway. Patient able to ambulate in hallway with sats above 90% on room air, dropping down to 88% but able to recover with pursed lip breathing without need for supplemental oxygen. Patient completing grooming at supervision level in standing, requiring min A for toilet transfer due to minor LOB when attempting to sit down. Patient left in recliner at end of session, OT will continue to follow.   ? ?Recommendations for follow up therapy are one component of a multi-disciplinary discharge planning process, led by the attending physician.  Recommendations may be updated based on patient status, additional functional criteria and insurance authorization. ?   ?Follow Up Recommendations ? No OT follow up  ?  ?Assistance Recommended at Discharge PRN  ?Patient can return home with the following ? Assistance with cooking/housework;Assist for transportation;Help with stairs or ramp for entrance ?  ?Equipment Recommendations ? None recommended by OT  ?  ?Recommendations for Other Services   ? ?  ?Precautions / Restrictions Precautions ?Precautions: Fall ?Precaution Comments: watch O2 and HR ?Restrictions ?Weight Bearing Restrictions: No  ? ? ?  ? ?Mobility Bed  Mobility ?Overal bed mobility: Modified Independent ?  ?  ?  ?  ?  ?  ?  ?  ? ?Transfers ?Overall transfer level: Modified independent ?Equipment used: None, 1 person hand held assist ?  ?  ?  ?  ?  ?  ?  ?General transfer comment: reaching out for therapist occasionally for balance ?  ?  ?Balance Overall balance assessment: Needs assistance ?Sitting-balance support: Feet supported, No upper extremity supported ?Sitting balance-Leahy Scale: Good ?  ?  ?Standing balance support: No upper extremity supported, During functional activity ?Standing balance-Leahy Scale: Fair ?Standing balance comment: minor LOB when transitioning down onto toilet seat ?  ?  ?  ?  ?  ?  ?  ?  ?  ?  ?  ?   ? ?ADL either performed or assessed with clinical judgement  ? ?ADL Overall ADL's : Needs assistance/impaired ?  ?  ?Grooming: Wash/dry hands;Wash/dry face;Oral care;Applying deodorant;Brushing hair;Set up;Standing ?  ?  ?  ?  ?  ?  ?  ?  ?  ?Toilet Transfer: Ambulation;Minimal assistance ?Toilet Transfer Details (indicate cue type and reason): reaching out for therapist's hand when decending onto toilet seat ?Toileting- Water quality scientist and Hygiene: Min guard;Sit to/from stand;Sitting/lateral lean ?  ?  ?  ?Functional mobility during ADLs: Minimal assistance;Cueing for safety ?General ADL Comments: Patient continuing to progress towards goals, minimal unsteadiness and STM deficits noted ?  ? ?Extremity/Trunk Assessment   ?  ?  ?  ?  ?  ? ?Vision   ?  ?  ?Perception   ?  ?Praxis   ?  ? ?Cognition Arousal/Alertness: Awake/alert ?Behavior During Therapy: [facility] for tasks assessed/performed ?Overall Cognitive Status: Within  Functional Limits for tasks assessed ?  ?  ?  ?  ?  ?  ?  ?  ?  ?  ?  ?  ?  ?  ?  ?  ?General Comments: min short term memory deficits, forgot she put her face cream on already at the sink when completing grooming tasks ?  ?  ?   ?Exercises   ? ?  ?Shoulder Instructions   ? ? ?  ?General Comments    ? ? ?Pertinent  Vitals/ Pain       Pain Assessment ?Pain Assessment: No/denies pain ? ?Home Living   ?  ?  ?  ?  ?  ?  ?  ?  ?  ?  ?  ?  ?  ?  ?  ?  ?  ?  ? ?  ?Prior Functioning/Environment    ?  ?  ?  ?   ? ?Frequency ? Min 2X/week  ? ? ? ? ?  ?Progress Toward Goals ? ?OT Goals(current goals can now be found in the care plan section) ? Progress towards OT goals: Progressing toward goals ? ?Acute Rehab OT Goals ?Patient Stated Goal: to go home ?OT Goal Formulation: With patient ?Time For Goal Achievement: 01/22/22 ?Potential to Achieve Goals: Good  ?Plan Discharge plan remains appropriate   ? ?Co-evaluation ? ? ?   ?  ?  ?  ?  ? ?  ?AM-PAC OT "6 Clicks" Daily Activity     ?Outcome Measure ? ? Help from another person eating meals?: A Little ?Help from another person taking care of personal grooming?: A Little ?Help from another person toileting, which includes using toliet, bedpan, or urinal?: A Little ?Help from another person bathing (including washing, rinsing, drying)?: A Little ?Help from another person to put on and taking off regular upper body clothing?: A Little ?Help from another person to put on and taking off regular lower body clothing?: A Little ?6 Click Score: 18 ? ?  ?End of Session Equipment Utilized During Treatment: Gait belt ? ?OT Visit Diagnosis: Unsteadiness on feet (R26.81) ?  ?Activity Tolerance Patient tolerated treatment well ?  ?Patient Left in chair;with call bell/phone within reach ?  ?Nurse Communication Mobility status ?  ? ?   ? ?Time: 6286-3817 ?OT Time Calculation (min): 34 min ? ?Charges: OT General Charges ?$OT Visit: 1 Visit ?OT Treatments ?$Self Care/Home Management : 23-37 mins ? ?Corinne Ports E. Trexton Escamilla, OTR/L ?Acute Rehabilitation Services ?[phone] ?[phone]  ? ?Corinne Ports Stephanny Tsutsui ?01/11/2022, 1:12 PM ?

## 2022-01-12 ENCOUNTER — Inpatient Hospital Stay (HOSPITAL_COMMUNITY): Payer: Medicare Other

## 2022-01-12 ENCOUNTER — Other Ambulatory Visit (HOSPITAL_COMMUNITY): Payer: Self-pay

## 2022-01-12 DIAGNOSIS — I10 Essential (primary) hypertension: Secondary | ICD-10-CM

## 2022-01-12 DIAGNOSIS — I25118 Atherosclerotic heart disease of native coronary artery with other forms of angina pectoris: Secondary | ICD-10-CM

## 2022-01-12 LAB — CBC
HCT: 40.8 % (ref 36.0–46.0)
Hemoglobin: 13.6 g/dL (ref 12.0–15.0)
MCH: 30.4 pg (ref 26.0–34.0)
MCHC: 33.3 g/dL (ref 30.0–36.0)
MCV: 91.3 fL (ref 80.0–100.0)
Platelets: 178 10*3/uL (ref 150–400)
RBC: 4.47 MIL/uL (ref 3.87–5.11)
RDW: 13.1 % (ref 11.5–15.5)
WBC: 14.1 10*3/uL — ABNORMAL HIGH (ref 4.0–10.5)
nRBC: 0 % (ref 0.0–0.2)

## 2022-01-12 LAB — MAGNESIUM: Magnesium: 2.4 mg/dL (ref 1.7–2.4)

## 2022-01-12 LAB — BASIC METABOLIC PANEL
Anion gap: 12 (ref 5–15)
BUN: 22 mg/dL (ref 8–23)
CO2: 27 mmol/L (ref 22–32)
Calcium: 8.4 mg/dL — ABNORMAL LOW (ref 8.9–10.3)
Chloride: 97 mmol/L — ABNORMAL LOW (ref 98–111)
Creatinine, Ser: 0.77 mg/dL (ref 0.44–1.00)
GFR, Estimated: >60 mL/min (ref >60)
Glucose, Bld: 94 mg/dL (ref 70–99)
Potassium: 3.6 mmol/L (ref 3.5–5.1)
Sodium: 136 mmol/L (ref 135–145)

## 2022-01-12 LAB — PROTIME-INR
INR: 2.3 — ABNORMAL HIGH (ref 0.8–1.2)
Prothrombin Time: 25.4 seconds — ABNORMAL HIGH (ref 11.4–15.2)

## 2022-01-12 MED ORDER — BENZONATATE 100 MG PO CAPS: 100.0000 mg | ORAL_CAPSULE | Freq: Four times a day (QID) | ORAL | 0 refills | Status: DC | PRN

## 2022-01-12 MED ORDER — GUAIFENESIN-DM 100-10 MG/5ML PO SYRP
5.0000 mL | ORAL_SOLUTION | ORAL | 0 refills | Status: DC | PRN
  Filled 2022-01-12: qty 118, 4d supply, fill #0

## 2022-01-12 MED ORDER — BENZONATATE 100 MG PO CAPS
100.0000 mg | ORAL_CAPSULE | Freq: Four times a day (QID) | ORAL | 0 refills | Status: DC | PRN
  Filled 2022-01-12: qty 30, 8d supply, fill #0

## 2022-01-12 MED ORDER — PREDNISONE 10 MG PO TABS
ORAL_TABLET | ORAL | 0 refills | Status: AC
Start: 2022-01-12 — End: 2022-01-21

## 2022-01-12 MED ORDER — LOSARTAN POTASSIUM 25 MG PO TABS: 25.0000 mg | ORAL_TABLET | Freq: Every day | ORAL | 1 refills | Status: DC

## 2022-01-12 MED ORDER — LOSARTAN POTASSIUM 25 MG PO TABS
25.0000 mg | ORAL_TABLET | Freq: Every day | ORAL | 1 refills | Status: DC
  Filled 2022-01-12: qty 30, 30d supply, fill #0

## 2022-01-12 MED ORDER — ALBUTEROL SULFATE (2.5 MG/3ML) 0.083% IN NEBU: 2.5000 mg | INHALATION_SOLUTION | RESPIRATORY_TRACT | 2 refills | Status: DC | PRN

## 2022-01-12 MED ORDER — METOPROLOL SUCCINATE ER 25 MG PO TB24
37.5000 mg | ORAL_TABLET | Freq: Every day | ORAL | 1 refills | Status: DC
Start: 2022-01-13 — End: 2022-01-18

## 2022-01-12 MED ORDER — POTASSIUM CHLORIDE ER 10 MEQ PO TBCR
40.0000 meq | EXTENDED_RELEASE_TABLET | Freq: Two times a day (BID) | ORAL | Status: DC
Start: 2022-01-12 — End: 2022-04-14

## 2022-01-12 MED ORDER — PREDNISONE 10 MG PO TABS
ORAL_TABLET | ORAL | 0 refills | Status: DC
  Filled 2022-01-12: qty 18, 9d supply, fill #0

## 2022-01-12 MED ORDER — LOSARTAN POTASSIUM 25 MG PO TABS: 25.0000 mg | ORAL_TABLET | Freq: Every day | ORAL | Status: DC

## 2022-01-12 MED ORDER — METOPROLOL SUCCINATE ER 25 MG PO TB24
37.5000 mg | ORAL_TABLET | Freq: Every day | ORAL | 1 refills | Status: DC
Start: 2022-01-13 — End: 2022-01-12
  Filled 2022-01-12: qty 45, 30d supply, fill #0

## 2022-01-12 MED ORDER — GUAIFENESIN-DM 100-10 MG/5ML PO SYRP: 5.0000 mL | ORAL_SOLUTION | ORAL | 0 refills | Status: DC | PRN

## 2022-01-12 MED ORDER — WARFARIN SODIUM 2 MG PO TABS: 2.0000 mg | ORAL_TABLET | Freq: Once | ORAL | Status: DC

## 2022-01-12 MED ORDER — TORSEMIDE 20 MG PO TABS: 40.0000 mg | ORAL_TABLET | Freq: Two times a day (BID) | ORAL | Status: DC

## 2022-01-12 MED ORDER — ALBUTEROL SULFATE (2.5 MG/3ML) 0.083% IN NEBU
2.5000 mg | INHALATION_SOLUTION | RESPIRATORY_TRACT | 2 refills | Status: DC | PRN
  Filled 2022-01-12: qty 75, 5d supply, fill #0

## 2022-01-12 NOTE — Care Management Important Message (Signed)
Important Message ? ?Patient Details  ?Name: Catherine [name] ?MRN: [id number] ?Date of Birth: 1934/12/20 ? ? ?Medicare Important Message Given:  Yes ? ? ? ? ?Quinette Hentges ?01/12/2022, 4:08 PM ?

## 2022-01-12 NOTE — Progress Notes (Signed)
SATURATION QUALIFICATIONS: (This note is used to comply with regulatory documentation for home oxygen) ? ?Patient Saturations on Room Air at Rest = 90% ? ?Patient Saturations on Room Air while Ambulating = 87% ? ?Patient Saturations on 1 Liters of oxygen while Ambulating = 92% ? ?Please briefly explain why patient needs home oxygen: ?

## 2022-01-12 NOTE — Progress Notes (Addendum)
Heart Failure Stewardship Pharmacist Progress Note ? ? ?PCP: [name], MD ?PCP-Cardiologist: [name], MD  ? ? ?HPI:  ?87 yo F with PMH of CHF, CAD s/p CABG, rheumatic heart disease, afib, history of frequent UTIs, and CKD. She presented to the ED on 4/11 with shortness of breath, LE edema, and orthopnea. CXR with possible mild interstitial edema. Her last ECHO was done 10/30/21 and LVEF was 60-65% with mildly reduced RV. ? ?Current HF Medications: ?Beta Blocker: metoprolol XL 37.5 mg daily ?ACE/ARB/ARNI: losartan 12.5 mg daily ? ?Prior to admission HF Medications: ?Diuretic: torsemide 40 mg BID ?Beta blocker: metoprolol XL 25 mg daily ? ?Pertinent Lab Values: ?Serum creatinine 0.77, BUN 22, Potassium 3.6, Sodium 136, Magnesium 2.4, BNP 184.1 ? ?Vital Signs: ?Weight: 144 lbs (admission weight: 150 lbs) ?Blood pressure: 140-160/60s  ?Heart rate: 50s ?I/O: -1L yesterday; net -2.9L ? ?Medication Assistance / Insurance Benefits Check: ?Does the patient have prescription insurance?  Yes ?Type of insurance plan: [facility] Medicare ? ?Outpatient Pharmacy:  ?Prior to admission outpatient pharmacy: [facility] ?Is the patient willing to use [facility] pharmacy at discharge? Yes ?Is the patient willing to transition their outpatient pharmacy to utilize a [facility] outpatient pharmacy?   Pending ?  ? ?Assessment: ?1. Acute on chronic diastolic CHF (EF 67-34%). NYHA class II symptoms. ?- Off IV lasix - consider starting PO today. Was on torsemide 40 mg BID PTA. ?- Continue metoprolol XL 37.5 mg daily ?- Intolerance to benazepril - caused cough. Continue losartan 12.5 mg daily ?- Intolerance to spironolactone - caused dyspnea ?- Caution SGLT2i with history of UTIs ?  ?Plan: ?1) Medication changes recommended at this time: ?- Start PTA torsemide 40 mg BID ? ?2) Patient assistance: ?- None pending ? ?3)  Education  ?- Patient has been educated on current HF medications and potential additions to HF medication  regimen ?- Patient verbalizes understanding that over the next few months, these medication doses may change and more medications may be added to optimize HF regimen ?- Patient has been educated on basic disease state pathophysiology and goals of therapy ? ? ?Kerby Nora, PharmD, BCPS ?Heart Failure Stewardship Pharmacist ?Phone [phone] ? ? ?

## 2022-01-12 NOTE — Discharge Summary (Signed)
? ?Physician Discharge Summary  ?Catherine [name] WCH:[id number] DOB: 05/05/1935 DOA: 01/05/2022 ? ?PCP: [name], MD ? ?Admit date: 01/05/2022 ?Discharge date: 01/12/2022 ? ?Admitted From: home ?Disposition:  home ? ?Recommendations for Outpatient Follow-up:  ?Follow up with PCP in 1-2 weeks ?Please obtain BMP/CBC in one week ? ?Home Health: none  ?Equipment/Devices: home O2 ? ?Discharge Condition: stable ?CODE STATUS: Full code ?Diet recommendation: low sodium ? ?HPI: Per admitting MD, ?Catherine [name] is a 87 y.o. female with medical history significant of chronic diastolic CHF, moderate tricuspid regurg, CAD s/p CABG, rheumatic heart disease, chronic atrial fibrillation on Coumadin, and CKD stage III presents with complaints of progressively worsening shortness of breath over the last 4 days.  She complains of having a productive cough with beige to brownish sputum production.  Been taking Mucinex without improvement.  Associated symptoms include wheezing, leg swelling, weight from 147 pounds to 152 pounds.  Denies having any significant fever.  She has been abiding by low-sodium diet and taking furosemide 40 mg twice daily.  Last hospitalized 2/2-2/7 for acute respiratory failure with hypoxia related to a combination of COPD and diastolic heart failure. On admission to the emergency department patient was noted to be afebrile with respirations 15-23, blood pressures maintained, and O2 saturations as low as 87-88% on room air with improvement on 3 L of nasal cannula oxygen.  Labs significant for platelets 128, potassium 3.3, BNP 184.1, INR 3, and high-sensitivity troponins negative x2.  Chest x-ray noted cardiomegaly with small left effusion and mild diffuse interstitial opacity concerning for edema or inflammatory process.  Patient was suspected to be fluid overloaded and given Lasix 60 mg IV, Valium 2.5 mg IV, and DuoNeb breathing treatments. ? ?Hospital Course / Discharge diagnoses: ?Principal Problem: ?   Acute respiratory failure with hypoxia  secondary to acute on chronic COPD and diastolic congestive heart failure exacerbation ?Active Problems: ?  Atrial fibrillation ([place]) ?  Hypertension ?  Chronic kidney disease ?  Coronary artery disease ?  Anxiety ? ? ?Assessment and Plan: ?Principal problem ?Acute respiratory failure with hypoxia  secondary to acute on chronic COPD and diastolic congestive heart failure exacerbation -cardiology consulted, appreciate input.  She was placed on IV furosemide, with significant improvement in her volume status and currently appears dry/euvolemic.  She was able to be weaned off to room air, however on ambulation she was found to require oxygen which I suspect is due to her underlying COPD.  She was maintained on steroids with improvement in her wheezing.  She was converted to p.o. diuretics per cardiology, will also do a steroid taper.  She is significantly improved, able to ambulate in the hallway, feeling better, and will be discharged home in stable condition with outpatient follow-up.  She has been reporting some dyspnea for several months, likely multifactorial due to deconditioning, COPD, diastolic CHF, A-fib.  ? ?Active problems ?Atrial fibrillation, intermittent RVR-Continue heart rate control with metoprolol and anticoagulation with Coumadin.  Currently on metoprolol 35.7 twice daily.  Rates overall stable, will prescribe on discharge  ?Hypertension -Continue metoprolol as below, on furosemide.  She was added on losartan  ?CKD 3a -Cr stable with diuresis ?Coronary artery disease -on aspirin, statin, no chest pain or ACS type features ?Dyslipidemia- continue with rosuvastatin.   ?Anxiety -Continue home regimen ? ?Sepsis ruled out ? ? ?Discharge Instructions ? ? ?Allergies as of 01/12/2022   ? ?   Reactions  ? Aldactone [spironolactone] Other (See Comments)  ? dyspnea  ?  Amoxicillin Palpitations  ? Tachycardia ?Has patient had a PCN reaction causing immediate rash,  facial/tongue/throat swelling, SOB or lightheadedness with hypotension: no ?Has patient had a PCN reaction causing severe rash involving mucus membranes or skin necrosis: {no ?Has patient had a PCN reaction that required hospitalization {no ?Has patient had a PCN reaction occurring within the last 10 years: {yes ?If all of the above answers are "NO", then may proceed with Cephalosporin use.  ? Diltiazem Other (See Comments)  ? Causing headaches  ? Flagyl [metronidazole Hcl] Other (See Comments)  ? Causing headaches  ? Flovent [fluticasone Propionate] Other (See Comments)  ? Leg cramps  ? Gabapentin Swelling  ? Lyrica [pregabalin] Swelling  ? Quinidine Diarrhea, Other (See Comments)  ? Fever diarrhea  ? Simvastatin Other (See Comments)  ? Leg pain, myalgia  ? Tramadol Nausea Only  ? Verapamil Other (See Comments)  ? myalgias  ? Amlodipine   ? Low extremity edema  ? Crestor [rosuvastatin]   ? myalgia  ? Livalo [pitavastatin]   ? myalgias  ? Zetia [ezetimibe]   ? LEG CRAMPS  ? Benazepril Hcl Cough  ? Ciprofloxacin Diarrhea  ? Codeine Nausea Only  ? Nitrofurantoin Monohyd Macro Nausea Only  ? ?  ? ?  ?Medication List  ?  ? ?TAKE these medications   ? ?acetaminophen 500 MG tablet ?Commonly known as: TYLENOL ?Take 1,000 mg by mouth every 6 (six) hours as needed for mild pain. ?  ?albuterol 108 (90 Base) MCG/ACT inhaler ?Commonly known as: VENTOLIN HFA ?INHALE 2 PUFFS INTO THE LUNGS EVERY 4 HOURS AS NEEDED FOR AHEEZING OR SHORTNESS OF BREATH ?What changed: Another medication with the same name was added. Make sure you understand how and when to take each. ?  ?albuterol (2.5 MG/3ML) 0.083% nebulizer solution ?Commonly known as: PROVENTIL ?Take 3 mLs (2.5 mg total) by nebulization every 4 (four) hours as needed for wheezing or shortness of breath. ?What changed: You were already taking a medication with the same name, and this prescription was added. Make sure you understand how and when to take each. ?  ?benzonatate 100 MG  capsule ?Commonly known as: [place] ?Take 1 capsule (100 mg total) by mouth every 6 (six) hours as needed for cough. ?  ?cholecalciferol 25 MCG (1000 UNIT) tablet ?Commonly known as: VITAMIN D3 ?Take 1,000 Units by mouth daily. ?  ?diazepam 5 MG tablet ?Commonly known as: VALIUM ?TAKE ONE TABLET AT BEDTIME ?  ?doxazosin 8 MG tablet ?Commonly known as: CARDURA ?TAKE 1 TABLET DAILY ?What changed: when to take this ?  ?guaiFENesin 600 MG 12 hr tablet ?Commonly known as: [place] ?Take 600 mg by mouth 3 (three) times daily as needed for to loosen phlegm. ?  ?guaiFENesin-dextromethorphan 100-10 MG/5ML syrup ?Commonly known as: ROBITUSSIN DM ?Take 5 mLs by mouth every 4 (four) hours as needed for cough. ?  ?HAIR/SKIN/NAILS/BIOTIN PO ?Take 1 tablet by mouth daily. ?  ?Multivitamin Adult Extra C Chew ?Chew 2 tablets by mouth daily. ?  ?losartan 25 MG tablet ?Commonly known as: COZAAR ?Take 1 tablet (25 mg total) by mouth daily. ?Start taking on: January 13, 2022 ?  ?metoprolol succinate 25 MG 24 hr tablet ?Commonly known as: TOPROL-XL ?Take 1.5 tablets (37.5 mg total) by mouth daily. ?Start taking on: January 13, 2022 ?What changed: how much to take ?  ?mupirocin ointment 2 % ?Commonly known as: BACTROBAN ?Apply 1 application. topically 2 (two) times daily. ?What changed: when to take this ?  ?  ondansetron 4 MG tablet ?Commonly known as: Zofran ?Take 1 tablet (4 mg total) by mouth every 8 (eight) hours as needed for nausea or vomiting. ?  ?potassium chloride 10 MEQ tablet ?Commonly known as: KLOR-CON ?TAKE UP TO 8 TABLETS DAILY AS DIRECTED ?What changed:  ?how much to take ?how to take this ?when to take this ?additional instructions ?  ?predniSONE 10 MG tablet ?Commonly known as: DELTASONE ?Take 3 tablets (30 mg total) by mouth daily for 3 days, THEN 2 tablets (20 mg total) daily for 3 days, THEN 1 tablet (10 mg total) daily for 3 days. ?Start taking on: January 12, 2022 ?  ?rosuvastatin 5 MG tablet ?Commonly known as:  CRESTOR ?Take one tablet every Monday, wednesday Friday and Sunday ?What changed:  ?how much to take ?how to take this ?when to take this ?additional instructions ?  ?Symbicort 80-4.5 MCG/ACT inhaler ?Generic dru

## 2022-01-12 NOTE — Progress Notes (Addendum)
?  Mobility Specialist Criteria Algorithm Info. ? ? ? 01/12/22 0945  ?Mobility  ?Activity Ambulated with assistance in hallway;Transferred from bed to chair ?(to chair after ambulation)  ?Range of Motion/Exercises Active;All extremities  ?Level of Assistance Standby assist, set-up cues, supervision of patient - no hands on  ?Assistive Device None  ?Distance Ambulated (ft) 400 ft  ?Activity Response Tolerated well  ? ?Patient received in supine eager to participate in mobility. Ambulated in hallway supervision level with slow steady gait. Oxygen saturation maintained >87% throughout but required cues for pursed lip breathing and supplemental O2. Returned to room without complaint or incident. Was left with all needs met, call bell in reach. MD & RN present.  ? ?01/12/2022 ?10:05 AM ? ?Martinique Tripp, CMS, BS EXP ?Acute Rehabilitation Services  ?DGLOV:[phone] ?Office: [phone] ? ?

## 2022-01-12 NOTE — Progress Notes (Signed)
ANTICOAGULATION CONSULT NOTE - Follow Up Consult ? ?Pharmacy Consult for Warfarin ?Indication: atrial fibrillation ? ?Allergies  ?Allergen Reactions  ? Aldactone [Spironolactone] Other (See Comments)  ?  dyspnea  ? Amoxicillin Palpitations  ?  Tachycardia ?Has patient had a PCN reaction causing immediate rash, facial/tongue/throat swelling, SOB or lightheadedness with hypotension: no ?Has patient had a PCN reaction causing severe rash involving mucus membranes or skin necrosis: {no ?Has patient had a PCN reaction that required hospitalization {no ?Has patient had a PCN reaction occurring within the last 10 years: {yes ?If all of the above answers are "NO", then may proceed with Cephalosporin use.  ? Diltiazem Other (See Comments)  ?  Causing headaches ?  ? Flagyl [Metronidazole Hcl] Other (See Comments)  ?  Causing headaches ? ?  ? Flovent [Fluticasone Propionate] Other (See Comments)  ?  Leg cramps  ? Gabapentin Swelling  ? Lyrica [Pregabalin] Swelling  ? Quinidine Diarrhea and Other (See Comments)  ?  Fever diarrhea  ? Simvastatin Other (See Comments)  ?  Leg pain, myalgia  ? Tramadol Nausea Only  ? Verapamil Other (See Comments)  ?  myalgias  ? Amlodipine   ?  Low extremity edema  ? Crestor [Rosuvastatin]   ?  myalgia  ? Livalo [Pitavastatin]   ?  myalgias  ? Zetia [Ezetimibe]   ?  LEG CRAMPS ?  ? Benazepril Hcl Cough  ? Ciprofloxacin Diarrhea  ? Codeine Nausea Only  ? Nitrofurantoin Monohyd Macro Nausea Only  ? ? ?Patient Measurements: ?Height: '5\' 3"'$  (160 cm) ?Weight: 65.5 kg (144 lb 8 oz) ?IBW/kg (Calculated) : 52.4 ? ?Vital Signs: ?Temp: 97.4 ?F (36.3 ?C) (04/18 0359) ?Temp Source: Oral (04/18 0359) ?BP: 166/62 (04/18 0359) ?Pulse Rate: 88 (04/18 0430) ? ?Labs: ?Recent Labs  ?  01/10/22 ?0326 01/11/22 ?0120 01/12/22 ?0938  ?HGB 12.4 12.9 13.6  ?HCT 37.2 39.3 40.8  ?PLT 161 198 178  ?LABPROT 22.6* 24.3* 25.4*  ?INR 2.0* 2.2* 2.3*  ?CREATININE 0.81 0.91 0.77  ? ? ? ?Estimated Creatinine Clearance: 47.6 mL/min  (by C-G formula based on SCr of 0.77 mg/dL). ? ? ? ?Assessment: ?51 yoF admitted with CHF/COPD exacerbation. Pt on warfarin PTA for hx AF and bMVR. INR therapeutic at 2.3, CBC stable. ? ?Home warfarin dose: '1mg'$  W/Sa, '2mg'$  AOD ? ?Goal of Therapy:  ?INR 2-3 ?Monitor platelets by anticoagulation protocol: Yes ?  ?Plan:  ?Warfarin '2mg'$  today ?Check INR daily ?Monitor Hgb/Hct and plts daily  ? ?Arrie Senate, PharmD, BCPS, BCCP ?Clinical Pharmacist ?[phone] ?Please check AMION for all [place] numbers ?01/12/2022 ? ? ?

## 2022-01-12 NOTE — TOC Transition Note (Signed)
Transition of Care (TOC) - CM/SW Discharge Note ? ? ?Patient Details  ?Name: KALLYN DEMARCUS ?MRN: [id number] ?Date of Birth: 20-Jun-1935 ? ?Transition of Care (TOC) CM/SW Contact:  ?Angelita Ingles, RN ?Phone [phone] ? ?01/12/2022, 12:06 PM ? ? ?Clinical Narrative:    ?TOC consulted for patient discharging home with DME needs. DME nebulizer and home O2 have been ordered and to be delivered to the bedside prior to discharge. No other needs noted at this time.  ? ? ?Final next level of care: Home/Self Care ?Barriers to Discharge: No Barriers Identified ? ? ?Patient Goals and CMS Choice ?  ?  ?  ? ?Discharge Placement ?  ?           ?  ?  ?  ?  ? ?Discharge Plan and Services ?  ?  ?           ?  ?  ?  ?  ?  ?  ?  ?  ?  ?  ? ?Social Determinants of Health (SDOH) Interventions ?  ? ? ?Readmission Risk Interventions ?   ? View : No data to display.  ?  ?  ?  ? ? ? ? ? ?

## 2022-01-12 NOTE — Plan of Care (Signed)
?  Problem: Education: ?Goal: Ability to demonstrate management of disease process will improve ?Outcome: Progressing ?  ?Problem: Activity: ?Goal: Capacity to carry out activities will improve ?Outcome: Progressing ?  ?Problem: Cardiac: ?Goal: Ability to achieve and maintain adequate cardiopulmonary perfusion will improve ?Outcome: Progressing ?  ?Problem: Clinical Measurements: ?Goal: Respiratory complications will improve ?Outcome: Progressing ?  ?Problem: Activity: ?Goal: Risk for activity intolerance will decrease ?Outcome: Progressing ?  ?

## 2022-01-12 NOTE — Progress Notes (Addendum)
? ?Progress Note ? ?Patient Name: Catherine [name] ?Date of Encounter: 01/12/2022 ? ?[facility] Cardiologist: [name], MD  ? ?Subjective  ? ?Patient feels better today. Breathing has improved, patient is able to lay flat without dyspnea. Continues to cough. Denies chest pain, palpitations, dizziness.  ? ?Inpatient Medications  ?  ?Scheduled Meds: ? arformoterol  15 mcg Nebulization BID  ? budesonide (PULMICORT) nebulizer solution  0.5 mg Nebulization BID  ? diazepam  5 mg Oral QHS  ? feeding supplement  237 mL Oral BID BM  ? guaiFENesin  600 mg Oral BID  ? ipratropium  0.5 mg Nebulization TID  ? levalbuterol  1.25 mg Nebulization TID  ? losartan  12.5 mg Oral Daily  ? mouth rinse  15 mL Mouth Rinse BID  ? metoprolol succinate  37.5 mg Oral Daily  ? mometasone-formoterol  2 puff Inhalation BID  ? multivitamin with minerals  1 tablet Oral Daily  ? polyethylene glycol  17 g Oral BID  ? potassium chloride  40 mEq Oral Daily  ? predniSONE  40 mg Oral Q breakfast  ? rosuvastatin  5 mg Oral Once per day on Sun Tue Thu Sat  ? sodium chloride flush  3 mL Intravenous Q12H  ? torsemide  40 mg Oral BID  ? warfarin  2 mg Oral ONCE-1600  ? Warfarin - Pharmacist Dosing Inpatient   Does not apply [id number]  ? ?Continuous Infusions: ? sodium chloride    ? ?PRN Meds: ?sodium chloride, acetaminophen, albuterol, ALPRAZolam, guaiFENesin-dextromethorphan, ondansetron (ZOFRAN) IV, sodium chloride flush  ? ?Vital Signs  ?  ?Vitals:  ? 01/12/22 0430 01/12/22 0756 01/12/22 0973 01/12/22 0815  ?BP:  (!) 148/52    ?Pulse: 88 78    ?Resp: 18 17    ?Temp:  (!) 97.4 ?F (36.3 ?C)    ?TempSrc:  Oral    ?SpO2: 92% 93% 94% 94%  ?Weight: 65.5 kg     ?Height:      ? ? ?Intake/Output Summary (Last 24 hours) at 01/12/2022 1035 ?Last data filed at 01/12/2022 0843 ?Gross per 24 hour  ?Intake 480 ml  ?Output 800 ml  ?Net -320 ml  ? ? ?  01/12/2022  ?  4:30 AM 01/11/2022  ?  5:20 AM 01/10/2022  ?  5:00 AM  ?Last 3 Weights  ?Weight (lbs) 144 lb 8 oz 145 lb  11.6 oz 148 lb 13 oz  ?Weight (kg) 65.545 kg 66.1 kg 67.5 kg  ?   ? ?Telemetry  ?  ?Atrial fibrillation, HR int he 50s, infrequent brief pauses 2 seconds or less - Personally Reviewed ? ?ECG  ?  ?Poor data quality (lead V2 not captured), junctional rhythm, HR 51 - Personally Reviewed ? ?Physical Exam  ? ?GEN: No acute distress.  Sitting comfortably in the chair on room air  ?Neck: supple  ?Cardiac: Irregular rate and rhythm, grade 1/6 systolic murmur best heard at RUSB  ?Respiratory: Diffuse wheezing  ?GI: Soft, nontender, non-distended  ?MS: Trace edema; No deformity. ?Neuro:  Nonfocal  ?Psych: Normal affect  ? ?Labs  ?  ?High Sensitivity Troponin:   ?Recent Labs  ?Lab 01/05/22 ?0116 01/05/22 ?0313  ?TROPONINIHS 10 10  ?   ?Chemistry ?Recent Labs  ?Lab 01/05/22 ?1041 01/06/22 ?5329 01/10/22 ?0326 01/11/22 ?0120 01/12/22 ?0052  ?NA  --    < > 138 140 136  ?K  --    < > 4.0 3.0* 3.6  ?CL  --    < >  102 103 97*  ?CO2  --    < > '29 29 27  '$ ?GLUCOSE  --    < > 129* 96 94  ?BUN  --    < > '22 23 22  '$ ?CREATININE  --    < > 0.81 0.91 0.77  ?CALCIUM  --    < > 8.9 9.0 8.4*  ?MG 1.9  --   --  2.6* 2.4  ?GFRNONAA  --    < > >60 >60 >60  ?ANIONGAP  --    < > '7 8 12  '$ ? < > = values in this interval not displayed.  ?  ?Lipids No results for input(s): CHOL, TRIG, HDL, LABVLDL, LDLCALC, CHOLHDL in the last 168 hours.  ?Hematology ?Recent Labs  ?Lab 01/10/22 ?0326 01/11/22 ?0120 01/12/22 ?4098  ?WBC 8.2 12.6* 14.1*  ?RBC 4.07 4.23 4.47  ?HGB 12.4 12.9 13.6  ?HCT 37.2 39.3 40.8  ?MCV 91.4 92.9 91.3  ?MCH 30.5 30.5 30.4  ?MCHC 33.3 32.8 33.3  ?RDW 13.1 13.1 13.1  ?PLT 161 198 178  ? ?Thyroid  ?Recent Labs  ?Lab 01/05/22 ?1041  ?TSH 1.716  ?  ?BNP ?Recent Labs  ?Lab 01/11/22 ?0120  ?BNP 226.4*  ?  ?DDimer No results for input(s): DDIMER in the last 168 hours.  ? ?Radiology  ?  ?No results found. ? ?Cardiac Studies  ? ?10/30/21 echo ?IMPRESSIONS  ? ? ? 1. Left ventricular ejection fraction, by estimation, is 60 to 65%. The  ?left ventricle  has normal function. The left ventricle has no regional  ?wall motion abnormalities. Left ventricular diastolic parameters are  ?indeterminate.  ? 2. Right ventricular systolic function is mildly reduced. The right  ?ventricular size is mildly enlarged. There is moderately elevated  ?pulmonary artery systolic pressure.  ? 3. Left atrial size was moderately dilated.  ? 4. Right atrial size was moderately dilated.  ? 5. Normal appearing bioprosthetic MVR no PVL low mean gradient at HR 58  ?bpm Stent struts protrude into LVOT but no obvious sub valvular gradients  ?Marland Kitchen The mitral valve has been repaired/replaced. No evidence of mitral valve  ?regurgitation. No evidence of  ?mitral stenosis.  ? 6. Tricuspid valve regurgitation is moderate.  ? 7. The aortic valve is tricuspid. There is moderate calcification of the  ?aortic valve. There is moderate thickening of the aortic valve. Aortic  ?valve regurgitation is not visualized. Mild aortic valve stenosis.  ? 8. The inferior vena cava is normal in size with greater than 50%  ?respiratory variability, suggesting right atrial pressure of 3 mmHg.  ? ?FINDINGS  ? Left Ventricle: Left ventricular ejection fraction, by estimation, is 60  ?to 65%. The left ventricle has normal function. The left ventricle has no  ?regional wall motion abnormalities. The left ventricular internal cavity  ?size was normal in size. There is  ? no left ventricular hypertrophy. Left ventricular diastolic parameters  ?are indeterminate.  ? ?Right Ventricle: The right ventricular size is mildly enlarged. No  ?increase in right ventricular wall thickness. Right ventricular systolic  ?function is mildly reduced. There is moderately elevated pulmonary artery  ?systolic pressure. The tricuspid  ?regurgitant velocity is 3.29 m/s, and with an assumed right atrial  ?pressure of 15 mmHg, the estimated right ventricular systolic pressure is  ?11.9 mmHg.  ? ?Left Atrium: Left atrial size was moderately dilated.   ? ?Right Atrium: Right atrial size was moderately dilated.  ? ?Pericardium: There is no evidence of pericardial  effusion.  ? ?Mitral Valve: Normal appearing bioprosthetic MVR no PVL low mean gradient  ?at HR 58 bpm Stent struts protrude into LVOT but no obvious sub valvular  ?gradients. The mitral valve has been repaired/replaced. No evidence of  ?mitral valve regurgitation. No  ?evidence of mitral valve stenosis. MV peak gradient, 15.4 mmHg. The mean  ?mitral valve gradient is 4.0 mmHg.  ? ?Tricuspid Valve: The tricuspid valve is normal in structure. Tricuspid  ?valve regurgitation is moderate . No evidence of tricuspid stenosis.  ? ?Aortic Valve: The aortic valve is tricuspid. There is moderate  ?calcification of the aortic valve. There is moderate thickening of the  ?aortic valve. Aortic valve regurgitation is not visualized. Mild aortic  ?stenosis is present. Aortic valve mean gradient  ?measures 13.0 mmHg. Aortic valve peak gradient measures 24.2 mmHg.  ? ?Pulmonic Valve: The pulmonic valve was normal in structure. Pulmonic valve  ?regurgitation is mild. No evidence of pulmonic stenosis.  ? ?Aorta: The aortic root is normal in size and structure.  ? ?Patient Profile  ?   ?87 y.o. 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 ?Assessment & Plan  ?  ?Acute on Chronic HFpEF  ?- Most recent echocardiogram on 10/30/21 showed LVEF 60-65%, no WMAs, indeterminate diastolic function, mild RV dysfunction, moderate pulmonary HTN, moderate BAE, normal MVR, mild AS mean grad 13 AVA VTI not reported ?- Was on IV lasix 60 mg BID this admission. Output at least 0.8 L urine yesterday (incomplete I/Os). Weight down to 144.5 lbs from 152 on admission  ?- Creatinine stable at 0.77  ?- Patient euvolemic on exam, able to lay flat without dyspnea. Suspect breathing issues/cough  are related to COPD rather than heart failure  ?- IV lasix stopped last night, transition to home dose of torsemide 40 mg BID today  ?- Patient unable to start spironolactone or ACEi due to history of int

## 2022-01-12 NOTE — Plan of Care (Signed)
?  Problem: Education: ?Goal: Ability to demonstrate management of disease process will improve ?01/12/2022 1559 by Thressa Sheller, RN ?Outcome: Adequate for Discharge ?01/12/2022 0849 by Thressa Sheller, RN ?Outcome: Progressing ?Goal: Ability to verbalize understanding of medication therapies will improve ?Outcome: Adequate for Discharge ?Goal: Individualized Educational Video(s) ?Outcome: Adequate for Discharge ?  ?Problem: Activity: ?Goal: Capacity to carry out activities will improve ?01/12/2022 1559 by Thressa Sheller, RN ?Outcome: Adequate for Discharge ?01/12/2022 0849 by Thressa Sheller, RN ?Outcome: Progressing ?  ?Problem: Cardiac: ?Goal: Ability to achieve and maintain adequate cardiopulmonary perfusion will improve ?01/12/2022 1559 by Thressa Sheller, RN ?Outcome: Adequate for Discharge ?01/12/2022 0849 by Thressa Sheller, RN ?Outcome: Progressing ?  ?Problem: Education: ?Goal: Knowledge of General Education information will improve ?Description: Including pain rating scale, medication(s)/side effects and non-pharmacologic comfort measures ?Outcome: Adequate for Discharge ?  ?Problem: Health Behavior/Discharge Planning: ?Goal: Ability to manage health-related needs will improve ?Outcome: Adequate for Discharge ?  ?Problem: Clinical Measurements: ?Goal: Ability to maintain clinical measurements within normal limits will improve ?Outcome: Adequate for Discharge ?Goal: Will remain free from infection ?Outcome: Adequate for Discharge ?Goal: Diagnostic test results will improve ?Outcome: Adequate for Discharge ?Goal: Respiratory complications will improve ?01/12/2022 1559 by Thressa Sheller, RN ?Outcome: Adequate for Discharge ?01/12/2022 0849 by Thressa Sheller, RN ?Outcome: Progressing ?Goal: Cardiovascular complication will be avoided ?Outcome: Adequate for Discharge ?  ?Problem: Activity: ?Goal: Risk for activity intolerance will decrease ?01/12/2022 1559 by Thressa Sheller,  RN ?Outcome: Adequate for Discharge ?01/12/2022 0849 by Thressa Sheller, RN ?Outcome: Progressing ?  ?Problem: Nutrition: ?Goal: Adequate nutrition will be maintained ?Outcome: Adequate for Discharge ?  ?Problem: Coping: ?Goal: Level of anxiety will decrease ?Outcome: Adequate for Discharge ?  ?Problem: Elimination: ?Goal: Will not experience complications related to bowel motility ?Outcome: Adequate for Discharge ?Goal: Will not experience complications related to urinary retention ?Outcome: Adequate for Discharge ?  ?Problem: Pain Managment: ?Goal: General experience of comfort will improve ?Outcome: Adequate for Discharge ?  ?Problem: Safety: ?Goal: Ability to remain free from injury will improve ?Outcome: Adequate for Discharge ?  ?Problem: Skin Integrity: ?Goal: Risk for impaired skin integrity will decrease ?Outcome: Adequate for Discharge ?  ?Problem: Acute Rehab PT Goals(only PT should resolve) ?Goal: Pt Will Ambulate ?Outcome: Adequate for Discharge ?Goal: Pt Will Go Up/Down Stairs ?Outcome: Adequate for Discharge ?  ?Problem: Increased Nutrient Needs (NI-5.1) ?Goal: Food and/or nutrient delivery ?Description: Individualized approach for food/nutrient provision. ?Outcome: Adequate for Discharge ?  ?Problem: Acute Rehab OT Goals (only OT should resolve) ?Goal: Pt. Will Perform Toileting-Clothing Manipulation ?Outcome: Adequate for Discharge ?Goal: OT Additional ADL Goal #1 ?Outcome: Adequate for Discharge ?Goal: OT Additional ADL Goal #2 ?Outcome: Adequate for Discharge ?  ?Problem: Acute Rehab OT Goals (only OT should resolve) ?Goal: Pt. Will Transfer To Toilet ?Outcome: Adequate for Discharge ?  ?

## 2022-01-13 ENCOUNTER — Telehealth: Payer: Self-pay

## 2022-01-13 NOTE — Telephone Encounter (Signed)
Transition Care Management Follow-up Telephone Call ?Date of discharge and from where: 01/12/2022 Catherine [name] ?How have you been since you were released from the hospital? Feeling much better ?Any questions or concerns? No ? ?Items Reviewed: ?Did the pt receive and understand the discharge instructions provided? Yes  ?Medications obtained and verified? Yes  ?Other? No  ?Any new allergies since your discharge? No  ?Dietary orders reviewed? Yes ?Do you have support at home? Yes  ? ?Home Care and Equipment/Supplies: ?Were home health services ordered? not applicable ?If so, what is the name of the agency? N/a  ?Has the agency set up a time to come to the patient's home? not applicable ?Were any new equipment or medical supplies ordered?  Yes: oxygen, nebulizer ?What is the name of the medical supply agency?  ?Were you able to get the supplies/equipment? yes ?Do you have any questions related to the use of the equipment or supplies? No ? ?Functional Questionnaire: (I = Independent and D = Dependent) ?ADLs: i ? ?Bathing/Dressing- i ? ?Meal Prep- i ? ?Eating- i ? ?Maintaining continence- i ? ?Transferring/Ambulation- i ? ?Managing Meds- i ? ?Follow up appointments reviewed: ? ?PCP Hospital f/u appt confirmed? Yes  Scheduled to see Dr. Elease Hashimoto on 01/22/2022 @ 11:30. ?Are transportation arrangements needed? No  ?If their condition worsens, is the pt aware to call PCP or go to the Emergency Dept.? No ?Was the patient provided with contact information for the PCP's office or ED? No ?Was to pt encouraged to call back with questions or concerns? No ? ?

## 2022-01-17 NOTE — Progress Notes (Signed)
? ? ?HEART & VASCULAR TRANSITION OF CARE CONSULT NOTE  ? ? ? ?Referring Physician: Dr. Cruzita Lederer ?Primary Care: Dr. Elease Hashimoto ?Primary Cardiologist: Dr. Oval Linsey ? ?HPI: ?Referred to clinic by Dr. Cruzita Lederer with [facility] for heart failure consultation. 87 y.o. female with history of longstanding persistent atrial fibrillation with tachybrady syndrome, rheumatic MV disease s/p mitral commissurotomy in 19770s and later bioprosthetic MVR and MAZE 2018, CAD s/p CABG X 1 (SVG to RCA) at time of MVR, aortic valve stenosis, COPD.  ? ?Continued to struggle with fatigue post-op. Has been uncertain whether she was in atrial fibrillation or sinus rhythm with long PR interval. She was seen by atrial fibrillation clinic in 2018 where this remained unclear. It was felt that if she was in atrial fibrillation it would be next to impossible to maintain sinus rhythm. ? ?Hospitalized 02/23 with COPD exacerbation and a/c HFpEF. Echo 02/23 EF 60-65%, RV mildly reduced, RVSP 58 mmHg, moderate BAE, normal appearing bioprosthetic MVR, moderate TR. ? ?She was admitted earlier this month with acute hypoxic respiratory failure in setting of COPD exacerbation and HFpEF. Cardiology consulted. Diuresed with IV lasix. Started on low-dose losartan, did not start spiro d/t prior intolerance. Had episodes of RVR during admit that were felt to contribute to CHF exacerbations. Developed bradycardia with HR 40s and pauses (2.0-2.4 seconds) while on metoprolol xl 50 mg daily. Less pauses when cut back to 37.5 mg daily. There was discussion considering addition of amiodarone to reduce risk of developing intermittent RVR vs pacemaker for tachybrady syndrome. ? ?Patient reports feeling much better since hospital discharge. Lower extremity edema has resolved. Dyspnea improved but still gets short of breath with exertion. Prior to hospitalization home weight was around 152 lb, averaging 135-137 since discharge. Has intermittent cough that has been  improving. Using tessalon perles, mucinex and her inhalers. Completing prednisone taper. Has home O2 to use PRN. She has been much more diligent with reducing sodium intake. Taking medications as prescribed except has been taking 25 mg metoprolol xl daily instead of 35 mg daily. Notes occasional palpitations and feels as if her heart rate speeds up at times while at rest. This has been chronic. ? ? ?Review of Systems: [y] = yes, '[ ]'$  = no  ? ?General: Weight gain '[ ]'$ ; Weight loss [Y]; Anorexia '[ ]'$ ; Fatigue '[ ]'$ ; Fever '[ ]'$ ; Chills '[ ]'$ ; Weakness '[ ]'$   ?Cardiac: Chest pain/pressure '[ ]'$ ; Resting SOB '[ ]'$ ; Exertional SOB [Y]; Orthopnea '[ ]'$ ; Pedal Edema '[ ]'$ ; Palpitations [Y]; Syncope '[ ]'$ ; Presyncope '[ ]'$ ; Paroxysmal nocturnal dyspnea'[ ]'$   ?Pulmonary: Cough [Y]; Wheezing'[ ]'$ ; Hemoptysis'[ ]'$ ; Sputum '[ ]'$ ; Snoring '[ ]'$   ?GI: Vomiting'[ ]'$ ; Dysphagia'[ ]'$ ; Melena'[ ]'$ ; Hematochezia '[ ]'$ ; Heartburn'[ ]'$ ; Abdominal pain '[ ]'$ ; Constipation '[ ]'$ ; Diarrhea '[ ]'$ ; BRBPR '[ ]'$   ?GU: Hematuria'[ ]'$ ; Dysuria '[ ]'$ ; Nocturia'[ ]'$   ?Vascular: Pain in legs with walking '[ ]'$ ; Pain in feet with lying flat '[ ]'$ ; Non-healing sores '[ ]'$ ; Stroke '[ ]'$ ; TIA '[ ]'$ ; Slurred speech '[ ]'$ ;  ?Neuro: Headaches'[ ]'$ ; Vertigo'[ ]'$ ; Seizures'[ ]'$ ; Paresthesias'[ ]'$ ;Blurred vision '[ ]'$ ; Diplopia '[ ]'$ ; Vision changes '[ ]'$   ?Ortho/Skin: Arthritis '[ ]'$ ; Joint pain '[ ]'$ ; Muscle pain '[ ]'$ ; Joint swelling '[ ]'$ ; Back Pain '[ ]'$ ; Rash '[ ]'$   ?Psych: Depression'[ ]'$ ; Anxiety'[ ]'$   ?Heme: Bleeding problems '[ ]'$ ; Clotting disorders '[ ]'$ ; Anemia '[ ]'$   ?Endocrine: Diabetes '[ ]'$ ; Thyroid dysfunction'[ ]'$  ? ? ?Past Medical History:  ?Diagnosis  Date  ? Acute on chronic diastolic heart failure ([place])   ? Aortic stenosis, mild 07/26/2017  ? Arthritis   ? Asthma   ? last attack 02/2015  ? Atrial fibrillation, chronic ([place])   ? Cellulitis of left lower extremity 08/17/2016  ? Ulcer associated with severe venous insufficiency  ? Chronic anticoagulation   ? Chronic diastolic CHF (congestive heart failure) ([place])   ? Chronic kidney disease   ?  "RIGHT MANY KIDNEY INFECTIONS AND STONES"  ? COPD (chronic obstructive pulmonary disease) ([place])   ? Coronary artery disease   ? Dizziness   ? H/O: rheumatic fever   ? Heart murmur   ? Hypertension   ? PONV (postoperative nausea and vomiting)   ? ' SOMETIMES', BUT NOT ALWAYS"  ? S/P Maze operation for atrial fibrillation 10/14/2016  ? Complete bilateral atrial lesion set using cryothermy and bipolar radiofrequency ablation - atrial appendage was not treated due to previous surgical procedure (open mitral commissurotomy)  ? S/P mitral valve replacement with bioprosthetic valve 10/14/2016  ? 29 mm Med[employer]onic Mosaic porcine bioprosthetic tissue valve  ? Tricuspid regurgitation 07/14/2021  ? UTI (urinary tract infection)   ? Valvular heart disease   ? Has mitral stenosis with prior mitral commissurotomy in 1970  ? ? ?Current Outpatient Medications  ?Medication Sig Dispense Refill  ? acetaminophen (TYLENOL) 500 MG tablet Take 1,000 mg by mouth every 6 (six) hours as needed for mild pain.    ? albuterol (PROVENTIL) (2.5 MG/3ML) 0.083% nebulizer solution Take 3 mLs (2.5 mg total) by nebulization every 4 (four) hours as needed for wheezing or shortness of breath. 75 mL 2  ? albuterol (VENTOLIN HFA) 108 (90 Base) MCG/ACT inhaler INHALE 2 PUFFS INTO THE LUNGS EVERY 4 HOURS AS NEEDED FOR AHEEZING OR SHORTNESS OF BREATH 8.5 g 2  ? benzonatate (TESSALON PERLES) 100 MG capsule Take 1 capsule (100 mg total) by mouth every 6 (six) hours as needed for cough. 30 capsule 0  ? cholecalciferol (VITAMIN D3) 25 MCG (1000 UNIT) tablet Take 1,000 Units by mouth daily.    ? diazepam (VALIUM) 5 MG tablet TAKE ONE TABLET AT BEDTIME 30 tablet 5  ? guaiFENesin (MUCINEX) 600 MG 12 hr tablet Take 600 mg by mouth 3 (three) times daily as needed for to loosen phlegm.    ? guaiFENesin-dextromethorphan (ROBITUSSIN DM) 100-10 MG/5ML syrup Take 5 mLs by mouth every 4 (four) hours as needed for cough. 118 mL 0  ? losartan (COZAAR) 25 MG tablet Take 1 tablet  (25 mg total) by mouth daily. 30 tablet 1  ? Multiple Vitamins-Minerals (HAIR/SKIN/NAILS/BIOTIN PO) Take 1 tablet by mouth daily.    ? Multiple Vitamins-Minerals (MULTIVITAMIN ADULT EXTRA C) CHEW Chew 2 tablets by mouth daily.    ? ondansetron (ZOFRAN) 4 MG tablet Take 1 tablet (4 mg total) by mouth every 8 (eight) hours as needed for nausea or vomiting. 15 tablet 0  ? potassium chloride (KLOR-CON) 10 MEQ tablet Take 4 tablets (40 mEq total) by mouth 2 (two) times daily.    ? predniSONE (DELTASONE) 10 MG tablet Take 3 tablets (30 mg total) by mouth daily for 3 days, THEN 2 tablets (20 mg total) daily for 3 days, THEN 1 tablet (10 mg total) daily for 3 days. 18 tablet 0  ? rosuvastatin (CRESTOR) 5 MG tablet Take one tablet every Monday, wednesday Friday and Sunday 30 tablet 11  ? SYMBICORT 80-4.5 MCG/ACT inhaler INHALE 2 PUFFS TWICE DAILY 10.2 g 6  ?  warfarin (COUMADIN) 2 MG tablet TAKE ONE TABLET DAILY BY MOUTH EXCEPT TAKE 1/2 TABLET ON WEDNESDAYS AND SATURDAYS OR AS DIRECTED BY ANTICOAGULATION CLINIC 100 tablet 1  ? doxazosin (CARDURA) 8 MG tablet TAKE 1 TABLET DAILY 90 tablet 3  ? metoprolol succinate (TOPROL-XL) 25 MG 24 hr tablet Take 1 tablet (25 mg total) by mouth daily. 30 tablet 1  ? mupirocin ointment (BACTROBAN) 2 % Apply 1 application. topically 2 (two) times daily. 22 g 2  ? torsemide (DEMADEX) 20 MG tablet Take 2 tablets (40 mg total) by mouth in the morning AND 1 tablet (20 mg total) every evening. Can take extra 1 tab ('20mg'$ ) as needed for weight gain.. 390 tablet 1  ? ?No current facility-administered medications for this encounter.  ? ? ?Allergies  ?Allergen Reactions  ? Aldactone [Spironolactone] Other (See Comments)  ?  dyspnea  ? Amoxicillin Palpitations  ?  Tachycardia ?Has patient had a PCN reaction causing immediate rash, facial/tongue/throat swelling, SOB or lightheadedness with hypotension: no ?Has patient had a PCN reaction causing severe rash involving mucus membranes or skin necrosis:  {no ?Has patient had a PCN reaction that required hospitalization {no ?Has patient had a PCN reaction occurring within the last 10 years: {yes ?If all of the above answers are "NO", then may proceed with [facility]

## 2022-01-18 ENCOUNTER — Other Ambulatory Visit (HOSPITAL_COMMUNITY): Payer: Self-pay

## 2022-01-18 ENCOUNTER — Telehealth (HOSPITAL_COMMUNITY): Payer: Self-pay | Admitting: *Deleted

## 2022-01-18 ENCOUNTER — Ambulatory Visit (INDEPENDENT_AMBULATORY_CARE_PROVIDER_SITE_OTHER): Payer: Medicare Other

## 2022-01-18 ENCOUNTER — Encounter (HOSPITAL_COMMUNITY): Payer: Self-pay

## 2022-01-18 ENCOUNTER — Ambulatory Visit (HOSPITAL_COMMUNITY)
Admit: 2022-01-18 | Discharge: 2022-01-18 | Disposition: A | Discharge status: Home / Self Care | Payer: Medicare Other | Attending: Physician Assistant | Admitting: Physician Assistant

## 2022-01-18 VITALS — BP 120/78 | HR 55 | Wt 139.0 lb

## 2022-01-18 DIAGNOSIS — I110 Hypertensive heart disease with heart failure: Secondary | ICD-10-CM | POA: Insufficient documentation

## 2022-01-18 DIAGNOSIS — Z7901 Long term (current) use of anticoagulants: Secondary | ICD-10-CM | POA: Diagnosis not present

## 2022-01-18 DIAGNOSIS — I361 Nonrheumatic tricuspid (valve) insufficiency: Secondary | ICD-10-CM

## 2022-01-18 DIAGNOSIS — I2510 Atherosclerotic heart disease of native coronary artery without angina pectoris: Secondary | ICD-10-CM

## 2022-01-18 DIAGNOSIS — Z79899 Other long term (current) drug therapy: Secondary | ICD-10-CM | POA: Insufficient documentation

## 2022-01-18 DIAGNOSIS — T45515A Adverse effect of anticoagulants, initial encounter: Secondary | ICD-10-CM

## 2022-01-18 DIAGNOSIS — I4811 Longstanding persistent atrial fibrillation: Secondary | ICD-10-CM | POA: Diagnosis not present

## 2022-01-18 DIAGNOSIS — I5032 Chronic diastolic (congestive) heart failure: Secondary | ICD-10-CM | POA: Diagnosis not present

## 2022-01-18 DIAGNOSIS — R002 Palpitations: Secondary | ICD-10-CM | POA: Diagnosis not present

## 2022-01-18 DIAGNOSIS — D6832 Hemorrhagic disorder due to extrinsic circulating anticoagulants: Secondary | ICD-10-CM

## 2022-01-18 DIAGNOSIS — I071 Rheumatic tricuspid insufficiency: Secondary | ICD-10-CM | POA: Insufficient documentation

## 2022-01-18 DIAGNOSIS — Z952 Presence of prosthetic heart valve: Secondary | ICD-10-CM | POA: Diagnosis not present

## 2022-01-18 DIAGNOSIS — Z951 Presence of aortocoronary bypass graft: Secondary | ICD-10-CM | POA: Insufficient documentation

## 2022-01-18 DIAGNOSIS — I5030 Unspecified diastolic (congestive) heart failure: Secondary | ICD-10-CM | POA: Insufficient documentation

## 2022-01-18 DIAGNOSIS — I495 Sick sinus syndrome: Secondary | ICD-10-CM | POA: Diagnosis not present

## 2022-01-18 DIAGNOSIS — J449 Chronic obstructive pulmonary disease, unspecified: Secondary | ICD-10-CM | POA: Insufficient documentation

## 2022-01-18 DIAGNOSIS — Z9981 Dependence on supplemental oxygen: Secondary | ICD-10-CM | POA: Insufficient documentation

## 2022-01-18 LAB — CBC
HCT: 41.4 % (ref 36.0–46.0)
Hemoglobin: 15.1 g/dL — ABNORMAL HIGH (ref 12.0–15.0)
MCH: 34.4 pg — ABNORMAL HIGH (ref 26.0–34.0)
MCHC: 36.5 g/dL — ABNORMAL HIGH (ref 30.0–36.0)
MCV: 94.3 fL (ref 80.0–100.0)
Platelets: 180 10*3/uL (ref 150–400)
RBC: 4.39 MIL/uL (ref 3.87–5.11)
RDW: 13.7 % (ref 11.5–15.5)
WBC: 16.6 10*3/uL — ABNORMAL HIGH (ref 4.0–10.5)
nRBC: 0 % (ref 0.0–0.2)

## 2022-01-18 LAB — BRAIN NATRIURETIC PEPTIDE: B Natriuretic Peptide: 153.2 pg/mL — ABNORMAL HIGH (ref 0.0–100.0)

## 2022-01-18 LAB — BASIC METABOLIC PANEL
Anion gap: 11 (ref 5–15)
BUN: 28 mg/dL — ABNORMAL HIGH (ref 8–23)
CO2: 26 mmol/L (ref 22–32)
Calcium: 9 mg/dL (ref 8.9–10.3)
Chloride: 104 mmol/L (ref 98–111)
Creatinine, Ser: 0.96 mg/dL (ref 0.44–1.00)
GFR, Estimated: 58 mL/min — ABNORMAL LOW (ref >60)
Glucose, Bld: 118 mg/dL — ABNORMAL HIGH (ref 70–99)
Potassium: 3.5 mmol/L (ref 3.5–5.1)
Sodium: 141 mmol/L (ref 135–145)

## 2022-01-18 LAB — POCT INR: INR: 3.4 — AB (ref 2.0–3.0)

## 2022-01-18 MED ORDER — TORSEMIDE 20 MG PO TABS: ORAL_TABLET | ORAL | 1 refills | Status: DC

## 2022-01-18 MED ORDER — METOPROLOL SUCCINATE ER 25 MG PO TB24: 25.0000 mg | ORAL_TABLET | Freq: Every day | ORAL | 1 refills | Status: DC

## 2022-01-18 NOTE — Patient Instructions (Signed)
Decrease your metoprolol to 1 tab ('25mg'$ ) daily.  ? ?Decrease your torsemide to '40mg'$  AM and '20mg'$  PM. May take 1 extra tab ('20mg'$ ) as needed for weight gain.  ? ?Your labs were drawn today, we will call you if there are abnormal results.  ? ?Do the following things EVERYDAY: ?Weigh yourself in the morning before breakfast. Write it down and keep it in a log. ?Take your medicines as prescribed ?Eat low salt foods--Limit salt (sodium) to 2000 mg per day.  ?Stay as active as you can everyday ?Limit all fluids for the day to less than 2 liters ? ?Thank you for allowing [name] to provider your heart failure care after your recent hospitalization. Please keep your follow up as scheduled with [facility].  ? ?If you have any questions, issues, or concerns before your next appointment please call our office at [phone], opt. 2 and leave a message for the triage nurse. ? ? ?

## 2022-01-18 NOTE — Telephone Encounter (Signed)
Called to confirm Heart & Vascular Transitions of Care appointment at 01/18/22 @ 12 noon. Patient reminded to bring all medications and pill box organizer with them. Confirmed patient has transportation. Gave directions, instructed to utilize [place] parking. ? ?Confirmed appointment prior to ending call.   ? ?Earnestine Leys, BSN, RN ?Heart Failure Nurse Navigator ?Secure Chat Only  ?

## 2022-01-18 NOTE — Progress Notes (Addendum)
Pt recently in hospital for Acute respiratory failure with hypoxia  secondary to acute on chronic COPD and diastolic congestive heart failure exacerbation and has had medications added. Pt reports an additional hypertension medication and also prednisone. Pt still has 2 days of prednisone. Pt also reports she was told in the hospital that she should be taking her warfarin in the morning and not in the evening as directed by anticoagulation clinic. Pt has an apt today with the pharmacy in the hospital to go over her meds. Advised to f/u to inquire why she was told to take warfarin in the morning. Do to the recommendation to take warfarin in the morning, the pt has already taken warfarin today so will need to hold dose tomorrow. Pt was also very stable on her current dosing so only one dose of warfarin will be held and no change will be made to weekly dosing.  ? ?Hold dose tomorrow and then continue 1 tablet daily except take 1/2 tablet on Wednesdays and Saturdays. Recheck in 2 weeks.  ?

## 2022-01-18 NOTE — Patient Instructions (Addendum)
Pre visit review using our clinic review tool, if applicable. No additional management support is needed unless otherwise documented below in the visit note. ? ?Hold dose tomorrow and then continue 1 tablet daily except take 1/2 tablet on Wednesdays and Saturdays. Recheck in 2 weeks.  ?

## 2022-01-18 NOTE — Progress Notes (Signed)
ReDS Vest / Clip - 01/18/22 1200   ? ?  ? ReDS Vest / Clip  ? Station Marker A   ? Ruler Value 34   ? ReDS Value Range Low volume   ? ReDS Actual Value 23   ? ?  ?  ? ?  ? ? ?

## 2022-01-19 ENCOUNTER — Ambulatory Visit (INDEPENDENT_AMBULATORY_CARE_PROVIDER_SITE_OTHER): Payer: Medicare Other

## 2022-01-19 DIAGNOSIS — J441 Chronic obstructive pulmonary disease with (acute) exacerbation: Secondary | ICD-10-CM

## 2022-01-19 DIAGNOSIS — I4891 Unspecified atrial fibrillation: Secondary | ICD-10-CM

## 2022-01-19 DIAGNOSIS — I10 Essential (primary) hypertension: Secondary | ICD-10-CM

## 2022-01-19 DIAGNOSIS — I5033 Acute on chronic diastolic (congestive) heart failure: Secondary | ICD-10-CM

## 2022-01-19 DIAGNOSIS — I2510 Atherosclerotic heart disease of native coronary artery without angina pectoris: Secondary | ICD-10-CM

## 2022-01-19 NOTE — Patient Instructions (Signed)
Visit Information ? ?Thank you for taking time to visit with me today. Please don't hesitate to contact me if I can be of assistance to you before our next scheduled telephone appointment. ? ?Following are the goals we discussed today:  ?Take all medications as prescribed ?Attend all scheduled provider appointments ?Call pharmacy for medication refills 3-7 days in advance of running out of medications ?Call provider office for new concerns or questions  ?call office if I gain more than 2 pounds in one day or 5 pounds in one week ?keep legs up while sitting ?track weight in diary ?watch for swelling in feet, ankles and legs every day ?weigh myself daily ?follow rescue plan if symptoms flare-up ?eat more whole grains, fruits and vegetables, lean meats and healthy fats ?identify and remove indoor air pollutants ?limit outdoor activity during cold weather ?do breathing exercises every day ?follow rescue plan if symptoms flare-up ?get at least 7 to 8 hours of sleep at night ?use devices that will help like a cane, sock-puller or reacher ?do breathing exercises every day ?check pulse (heart) rate once a day ?make a plan to eat healthy ?keep all lab appointments ?check blood pressure 3 times per week ?take blood pressure log to all doctor appointments ?call doctor for signs and symptoms of high blood pressure ?take medications for blood pressure exactly as prescribed ?limit salt intake to less than '2000mg'$ /day ?call for medicine refill 2 or 3 days before it runs out ?take all medications exactly as prescribed ?call doctor with any symptoms you believe are related to your medicine ? ?Our next appointment is by telephone on 02/16/22 at 1:15 PM ? ?Please call the care guide team at [phone] if you need to cancel or reschedule your appointment.  ? ?If you are experiencing a Mental Health or [place] or need someone to talk to, please call the Suicide and Crisis Lifeline: 988 ?call the [place] National Suicide  Prevention Lifeline: 2153734090 or TTY: (639) 176-8300 TTY (587) 686-0435) to talk to a trained counselor ?call 1-800-273-TALK (toll free, 24 hour hotline) ?go to [facility] [address] [place] [phone]) ?call 911  ? ?The patient verbalized understanding of instructions, educational materials, and care plan provided today and agreed to receive a mailed copy of patient instructions, educational materials, and care plan.  ? ?Peter Garter RN, BSN,CCM, CDE ?Care Management Coordinator ?[facility] ?[phone]   ?

## 2022-01-19 NOTE — Chronic Care Management (AMB) (Signed)
?Chronic Care Management  ? ?CCM RN Visit Note ? ?01/19/2022 ?Name: Catherine [name] MRN: [id number] DOB: Oct 30, 1934 ? ?Subjective: ?Catherine [name] is a 87 y.o. year old female who is a primary care patient of Burchette, Alinda Sierras, MD. The care management team was consulted for assistance with disease management and care coordination needs.   ? ?Engaged with patient by telephone for follow up visit in response to provider referral for case management and/or care coordination services.  ? ?Consent to Services:  ?The patient was given information about Chronic Care Management services, agreed to services, and gave verbal consent prior to initiation of services.  Please see initial visit note for detailed documentation.  ? ?Patient agreed to services and verbal consent obtained.  ? ?Assessment: Review of patient past medical history, allergies, medications, health status, including review of consultants reports, laboratory and other test data, was performed as part of comprehensive evaluation and provision of chronic care management services.  ? ?SDOH (Social Determinants of Health) assessments and interventions performed:   ? ?CCM Care Plan ? ?Allergies  ?Allergen Reactions  ? Aldactone [Spironolactone] Other (See Comments)  ?  dyspnea  ? Amoxicillin Palpitations  ?  Tachycardia ?Has patient had a PCN reaction causing immediate rash, facial/tongue/throat swelling, SOB or lightheadedness with hypotension: no ?Has patient had a PCN reaction causing severe rash involving mucus membranes or skin necrosis: {no ?Has patient had a PCN reaction that required hospitalization {no ?Has patient had a PCN reaction occurring within the last 10 years: {yes ?If all of the above answers are "NO", then may proceed with Cephalosporin use.  ? Diltiazem Other (See Comments)  ?  Causing headaches ?  ? Flagyl [Metronidazole Hcl] Other (See Comments)  ?  Causing headaches ? ?  ? Flovent [Fluticasone Propionate] Other (See Comments)  ?  Leg cramps   ? Gabapentin Swelling  ? Lyrica [Pregabalin] Swelling  ? Quinidine Diarrhea and Other (See Comments)  ?  Fever diarrhea  ? Simvastatin Other (See Comments)  ?  Leg pain, myalgia  ? Tramadol Nausea Only  ? Verapamil Other (See Comments)  ?  myalgias  ? Amlodipine   ?  Low extremity edema  ? Crestor [Rosuvastatin]   ?  myalgia  ? Livalo [Pitavastatin]   ?  myalgias  ? Zetia [Ezetimibe]   ?  LEG CRAMPS ?  ? Benazepril Hcl Cough  ? Ciprofloxacin Diarrhea  ? Codeine Nausea Only  ? Nitrofurantoin Monohyd Macro Nausea Only  ? ? ?Outpatient Encounter Medications as of 01/19/2022  ?Medication Sig  ? acetaminophen (TYLENOL) 500 MG tablet Take 1,000 mg by mouth every 6 (six) hours as needed for mild pain.  ? albuterol (PROVENTIL) (2.5 MG/3ML) 0.083% nebulizer solution Take 3 mLs (2.5 mg total) by nebulization every 4 (four) hours as needed for wheezing or shortness of breath.  ? albuterol (VENTOLIN HFA) 108 (90 Base) MCG/ACT inhaler INHALE 2 PUFFS INTO THE LUNGS EVERY 4 HOURS AS NEEDED FOR AHEEZING OR SHORTNESS OF BREATH  ? benzonatate (TESSALON PERLES) 100 MG capsule Take 1 capsule (100 mg total) by mouth every 6 (six) hours as needed for cough.  ? cholecalciferol (VITAMIN D3) 25 MCG (1000 UNIT) tablet Take 1,000 Units by mouth daily.  ? diazepam (VALIUM) 5 MG tablet TAKE ONE TABLET AT BEDTIME  ? doxazosin (CARDURA) 8 MG tablet TAKE 1 TABLET DAILY  ? guaiFENesin (MUCINEX) 600 MG 12 hr tablet Take 600 mg by mouth 3 (three) times daily as needed for to  loosen phlegm.  ? guaiFENesin-dextromethorphan (ROBITUSSIN DM) 100-10 MG/5ML syrup Take 5 mLs by mouth every 4 (four) hours as needed for cough.  ? losartan (COZAAR) 25 MG tablet Take 1 tablet (25 mg total) by mouth daily.  ? metoprolol succinate (TOPROL-XL) 25 MG 24 hr tablet Take 1 tablet (25 mg total) by mouth daily.  ? Multiple Vitamins-Minerals (HAIR/SKIN/NAILS/BIOTIN PO) Take 1 tablet by mouth daily.  ? Multiple Vitamins-Minerals (MULTIVITAMIN ADULT EXTRA C) CHEW Chew 2  tablets by mouth daily.  ? mupirocin ointment (BACTROBAN) 2 % Apply 1 application. topically 2 (two) times daily.  ? ondansetron (ZOFRAN) 4 MG tablet Take 1 tablet (4 mg total) by mouth every 8 (eight) hours as needed for nausea or vomiting.  ? potassium chloride (KLOR-CON) 10 MEQ tablet Take 4 tablets (40 mEq total) by mouth 2 (two) times daily.  ? predniSONE (DELTASONE) 10 MG tablet Take 3 tablets (30 mg total) by mouth daily for 3 days, THEN 2 tablets (20 mg total) daily for 3 days, THEN 1 tablet (10 mg total) daily for 3 days.  ? rosuvastatin (CRESTOR) 5 MG tablet Take one tablet every Monday, wednesday Friday and Sunday  ? SYMBICORT 80-4.5 MCG/ACT inhaler INHALE 2 PUFFS TWICE DAILY  ? torsemide (DEMADEX) 20 MG tablet Take 2 tablets (40 mg total) by mouth in the morning AND 1 tablet (20 mg total) every evening. Can take extra 1 tab ('20mg'$ ) as needed for weight gain..  ? warfarin (COUMADIN) 2 MG tablet TAKE ONE TABLET DAILY BY MOUTH EXCEPT TAKE 1/2 TABLET ON WEDNESDAYS AND SATURDAYS OR AS DIRECTED BY ANTICOAGULATION CLINIC  ? ?No facility-administered encounter medications on file as of 01/19/2022.  ? ? ?Patient Active Problem List  ? Diagnosis Date Noted  ? Hypertension   ? Chronic kidney disease   ? Anxiety   ? Acute exacerbation of CHF (congestive heart failure) ([place]) 01/05/2022  ? Chest wall pain 11/02/2021  ? Nausea without vomiting 10/30/2021  ? Tricuspid regurgitation 07/14/2021  ? Protein-calorie malnutrition, severe 09/05/2019  ? Dehydration   ? Gastroenteritis   ? Enteritis 09/03/2019  ? COVID-19 08/27/2019  ? Dysuria 07/07/2019  ? Chronic anxiety 12/23/2017  ? Aortic stenosis, mild 07/26/2017  ? S/P Maze operation for atrial fibrillation 10/14/2016  ? S/P CABG x 1 10/14/2016  ? Cellulitis of left lower extremity 08/17/2016  ? Coronary artery disease   ? Chronic insomnia 07/12/2016  ? DOE (dyspnea on exertion) 03/26/2015  ? Warfarin-induced coagulopathy ([place]) 03/16/2015  ? Valvular heart disease  03/16/2015  ? Nephrolithiasis 03/16/2015  ? Acute respiratory failure with hypoxia  secondary to acute on chronic COPD and diastolic congestive heart failure exacerbation 03/11/2015  ? Essential hypertension   ? RHEUMATIC MITRAL STENOSIS 01/05/2008  ? Rheumatic heart disease 01/05/2008  ? Atrial fibrillation ([place]) 01/05/2008  ? ? ?Conditions to be addressed/monitored:Atrial Fibrillation, CHF, HTN, and COPD ? ?Care Plan : RN Care Manager Plan of Care  ?Updates made by Dimitri Ped, RN since 01/19/2022 12:00 AM  ?  ? ?Problem: Chronic Disease Management and Care Coordination Needs (CHF,COPD, Atrial Fib, HTN, HLD,CAD)   ?Priority: High  ?  ? ?Long-Range Goal: Establish Plan of Care for Chronic Disease Management Needs (CHF,COPD, Atrial Fib, HTN, HLD,CAD)   ?Start Date: 11/06/2021  ?Expected End Date: 11/06/2022  ?Priority: High  ?Note:   ?Current Barriers:  ?Knowledge Deficits related to plan of care for management of Atrial Fibrillation, CHF, CAD, HTN, HLD, and COPD  ?Chronic Disease Management support and  education needs related to Atrial Fibrillation, CHF, CAD, HTN, HLD, and COPD  ?States she saw Heart failure clinic yesterday.  States she is still feeling weak from being in the hospital.  States she has O2 but she has not needed to wear. States she gets short of breath with mild exertion but recovers when she rests.  States she is using her inhalers as ordered and has not needed to use her Albuterol inhaler.  States she is still using the incentive spirometer she got in the hospital.  States she is weighting daily and she weighted 135 this morning. Denies any chest pains or increase in swelling.  States she tries to not use salt and mostly cooks at home. States she has had some back pain since she came home from the hospital and she is trying to walk as she can to help it. ? ?RNCM Clinical Goal(s):  ?Patient will verbalize understanding of plan for management of Atrial Fibrillation, CHF, CAD, HTN, HLD, and COPD  as evidenced by voiced adherence to plan of care ?verbalize basic understanding of  Atrial Fibrillation, CHF, CAD, HTN, HLD, and COPD disease process and self health management plan as evidenced by voiced unders

## 2022-01-22 ENCOUNTER — Ambulatory Visit (INDEPENDENT_AMBULATORY_CARE_PROVIDER_SITE_OTHER): Payer: Medicare Other | Admitting: Family Medicine

## 2022-01-22 ENCOUNTER — Encounter: Payer: Self-pay | Admitting: Family Medicine

## 2022-01-22 VITALS — BP 116/60 | HR 68 | Temp 97.5°F | Ht 63.0 in | Wt 140.0 lb

## 2022-01-22 DIAGNOSIS — I5043 Acute on chronic combined systolic (congestive) and diastolic (congestive) heart failure: Secondary | ICD-10-CM | POA: Diagnosis not present

## 2022-01-22 DIAGNOSIS — I10 Essential (primary) hypertension: Secondary | ICD-10-CM | POA: Diagnosis not present

## 2022-01-22 DIAGNOSIS — I4811 Longstanding persistent atrial fibrillation: Secondary | ICD-10-CM | POA: Diagnosis not present

## 2022-01-22 DIAGNOSIS — R531 Weakness: Secondary | ICD-10-CM | POA: Diagnosis not present

## 2022-01-22 DIAGNOSIS — R300 Dysuria: Secondary | ICD-10-CM

## 2022-01-22 LAB — POC URINALSYSI DIPSTICK (AUTOMATED)
Bilirubin, UA: NEGATIVE
Blood, UA: NEGATIVE
Glucose, UA: NEGATIVE
Ketones, UA: NEGATIVE
Leukocytes, UA: NEGATIVE
Nitrite, UA: NEGATIVE
Protein, UA: NEGATIVE
Spec Grav, UA: 1.015 (ref 1.010–1.025)
Urobilinogen, UA: 0.2 E.U./dL
pH, UA: 6.5 (ref 5.0–8.0)

## 2022-01-22 NOTE — Progress Notes (Signed)
? ?Established Patient Office Visit ? ?Subjective   ?Patient ID: Catherine [name], female    DOB: [birth year]  Age: 87 y.o. MRN: [id number] ? ?Chief Complaint  ?Patient presents with  ? Hospitalization Follow-up  ? ? ?HPI ? ? ?Patient seen for hospital follow-up.  She has history of COPD and heart failure with preserved ejection fraction.  She was admitted back in February of this year with COPD exacerbation and heart failure and echo at that time showed EF 60 to 65% with normal-appearing bioprosthetic mitral valve.   ? ?Recent admission for acute hypoxic respiratory failure.  She has really not felt well for months.  It was felt that she had combination of COPD exacerbation and heart failure with preserved ejection fraction.  Cardiology consulted.  Diuresed with IV Lasix.  Her weight was up around 150 pounds and currently down to 140.  She was on high-dose torsemide prior to admission and currently taking 20 mg 2 in the morning and 1 at night.  Thus far, weight stable since discharge. ? ?She apparently had episodes of rapid ventricular response that were felt to be contributing to CHF exacerbations.  Started on low-dose losartan.  She developed some bradycardia with heart rate apparently in the 40s.  There was reported discussion of consideration for amiodarone to reduce risk of developing intermittent rapid ventricular spots versus pacemaker for tachybradycardia syndrome. ? ?Her main complaint at this time is general fatigue and generalized weakness.  Dyspnea is some improved.  Peripheral edema improved.  Still has occasional cough.  She has home O2 but not using.  Trying to watch sodium intake closely. ? ?She has noted abnormal "odor "with urine.  No burning with urination.  Requesting urinalysis.  No gross hematuria. ? ?She had recent labs per cardiology.  BNP 153.  Her white count was 16.6 thousand but she is finishing prednisone from recent taper. ? ?Past Medical History:  ?Diagnosis Date  ? Acute on chronic  diastolic heart failure ([place])   ? Aortic stenosis, mild 07/26/2017  ? Arthritis   ? Asthma   ? last attack 02/2015  ? Atrial fibrillation, chronic ([place])   ? Cellulitis of left lower extremity 08/17/2016  ? Ulcer associated with severe venous insufficiency  ? Chronic anticoagulation   ? Chronic diastolic CHF (congestive heart failure) ([place])   ? Chronic kidney disease   ? "RIGHT MANY KIDNEY INFECTIONS AND STONES"  ? COPD (chronic obstructive pulmonary disease) ([place])   ? Coronary artery disease   ? Dizziness   ? H/O: rheumatic fever   ? Heart murmur   ? Hypertension   ? PONV (postoperative nausea and vomiting)   ? ' SOMETIMES', BUT NOT ALWAYS"  ? S/P Maze operation for atrial fibrillation 10/14/2016  ? Complete bilateral atrial lesion set using cryothermy and bipolar radiofrequency ablation - atrial appendage was not treated due to previous surgical procedure (open mitral commissurotomy)  ? S/P mitral valve replacement with bioprosthetic valve 10/14/2016  ? 29 mm Medtronic Mosaic porcine bioprosthetic tissue valve  ? Tricuspid regurgitation 07/14/2021  ? UTI (urinary tract infection)   ? Valvular heart disease   ? Has mitral stenosis with prior mitral commissurotomy in 1970  ? ?Past Surgical History:  ?Procedure Laterality Date  ? ABDOMINAL HYSTERECTOMY  1983  ? endometriosis  ? APPENDECTOMY    ? BACK SURGERY    ? neurosurgery x2  ? CARDIAC CATHETERIZATION    ? CARDIAC CATHETERIZATION N/A 08/03/2016  ? Procedure: Right/Left Heart Cath and Coronary  Angiography;  Surgeon: Peter M Martinique, MD;  Location: [facility];  Service: Cardiovascular;  Laterality: N/A;  ? cataract surg    ? CHOLECYSTECTOMY N/A 05/30/2015  ? Procedure: LAPAROSCOPIC CHOLECYSTECTOMY WITH INTRAOPERATIVE CHOLANGIOGRAM;  Surgeon: Excell Seltzer, MD;  Location: [facility];  Service: General;  Laterality: N/A;  ? COLONOSCOPY    ? CORONARY ARTERY BYPASS GRAFT N/A 10/14/2016  ? Procedure: CORONARY ARTERY BYPASS GRAFTING (CABG);  Surgeon: Rexene Alberts, MD;   Location: [facility];  Service: Open Heart Surgery;  Laterality: N/A;  ? EYE SURGERY    ? MAZE N/A 10/14/2016  ? Procedure: MAZE;  Surgeon: Rexene Alberts, MD;  Location: [facility];  Service: Open Heart Surgery;  Laterality: N/A;  ? MITRAL VALVE REPLACEMENT N/A 10/14/2016  ? Procedure: REDO MITRAL VALVE REPLACEMENT (MVR);  Surgeon: Rexene Alberts, MD;  Location: [facility];  Service: Open Heart Surgery;  Laterality: N/A;  ? MITRAL VALVE SURGERY Left 1970  ? Open mitral commissurotomy via left thoracotomy approach  ? TEE WITHOUT CARDIOVERSION N/A 07/05/2016  ? Procedure: TRANSESOPHAGEAL ECHOCARDIOGRAM (TEE);  Surgeon: [name], MD;  Location: [facility];  Service: Cardiovascular;  Laterality: N/A;  ? TEE WITHOUT CARDIOVERSION N/A 10/14/2016  ? Procedure: TRANSESOPHAGEAL ECHOCARDIOGRAM (TEE);  Surgeon: Rexene Alberts, MD;  Location: [facility];  Service: Open Heart Surgery;  Laterality: N/A;  ? ? reports that she quit smoking about 46 years ago. Her smoking use included cigarettes. She has a 15.00 pack-year smoking history. She has never used smokeless tobacco. She reports that she does not drink alcohol and does not use drugs. ?family history includes Leukemia in her father. ?Allergies  ?Allergen Reactions  ? Aldactone [Spironolactone] Other (See Comments)  ?  dyspnea  ? Amoxicillin Palpitations  ?  Tachycardia ?Has patient had a PCN reaction causing immediate rash, facial/tongue/throat swelling, SOB or lightheadedness with hypotension: no ?Has patient had a PCN reaction causing severe rash involving mucus membranes or skin necrosis: {no ?Has patient had a PCN reaction that required hospitalization {no ?Has patient had a PCN reaction occurring within the last 10 years: {yes ?If all of the above answers are "NO", then may proceed with Cephalosporin use.  ? Diltiazem Other (See Comments)  ?  Causing headaches ?  ? Flagyl [Metronidazole Hcl] Other (See Comments)  ?  Causing headaches ? ?  ? Flovent [Fluticasone Propionate] Other  (See Comments)  ?  Leg cramps  ? Gabapentin Swelling  ? Lyrica [Pregabalin] Swelling  ? Quinidine Diarrhea and Other (See Comments)  ?  Fever diarrhea  ? Simvastatin Other (See Comments)  ?  Leg pain, myalgia  ? Tramadol Nausea Only  ? Verapamil Other (See Comments)  ?  myalgias  ? Amlodipine   ?  Low extremity edema  ? Crestor [Rosuvastatin]   ?  myalgia  ? Livalo [Pitavastatin]   ?  myalgias  ? Zetia [Ezetimibe]   ?  LEG CRAMPS ?  ? Benazepril Hcl Cough  ? Ciprofloxacin Diarrhea  ? Codeine Nausea Only  ? Nitrofurantoin Monohyd Macro Nausea Only  ? ? ?Review of Systems  ?Constitutional:  Positive for malaise/fatigue. Negative for chills and fever.  ?Respiratory:  Negative for hemoptysis, sputum production and wheezing.   ?Cardiovascular:  Negative for chest pain and leg swelling.  ?Gastrointestinal:  Negative for blood in stool, diarrhea, nausea and vomiting.  ?Genitourinary:  Positive for dysuria. Negative for flank pain and hematuria.  ?Neurological:  Negative for dizziness.  ? ?  ?Objective:  ?  ? ?  BP 116/60 (BP Location: Left Arm, Patient Position: Sitting, Cuff Size: Normal)   Pulse 68   Temp (!) 97.5 ?F (36.4 ?C) (Oral)   Ht '5\' 3"'$  (1.6 m)   Wt 140 lb (63.5 kg)   SpO2 95%   BMI 24.80 kg/m?  ? ? ?Physical Exam ?Vitals reviewed.  ?Constitutional:   ?   Appearance: Normal appearance.  ?Cardiovascular:  ?   Rate and Rhythm: Normal rate.  ?Pulmonary:  ?   Effort: Pulmonary effort is normal.  ?   Breath sounds: Normal breath sounds. No wheezing or rales.  ?Musculoskeletal:  ?   Right lower leg: No edema.  ?   Left lower leg: No edema.  ?Neurological:  ?   General: No focal deficit present.  ?   Mental Status: She is alert.  ? ? ? ?Results for orders placed or performed in visit on 01/22/22  ?POCT Urinalysis Dipstick (Automated)  ?Result Value Ref Range  ? Color, UA yellow   ? Clarity, UA clear   ? Glucose, UA Negative Negative  ? Bilirubin, UA negative   ? Ketones, UA negative   ? Spec Grav, UA 1.015 1.010 -  1.025  ? Blood, UA negative   ? pH, UA 6.5 5.0 - 8.0  ? Protein, UA Negative Negative  ? Urobilinogen, UA 0.2 0.2 or 1.0 E.U./dL  ? Nitrite, UA negative   ? Leukocytes, UA Negative Negative  ? ? ?Last CBC ?Lab R

## 2022-01-24 DIAGNOSIS — I509 Heart failure, unspecified: Secondary | ICD-10-CM | POA: Diagnosis not present

## 2022-01-24 DIAGNOSIS — E785 Hyperlipidemia, unspecified: Secondary | ICD-10-CM

## 2022-01-24 DIAGNOSIS — I2510 Atherosclerotic heart disease of native coronary artery without angina pectoris: Secondary | ICD-10-CM | POA: Diagnosis not present

## 2022-01-24 DIAGNOSIS — I110 Hypertensive heart disease with heart failure: Secondary | ICD-10-CM

## 2022-01-24 DIAGNOSIS — J449 Chronic obstructive pulmonary disease, unspecified: Secondary | ICD-10-CM | POA: Diagnosis not present

## 2022-01-24 DIAGNOSIS — I4891 Unspecified atrial fibrillation: Secondary | ICD-10-CM

## 2022-01-27 ENCOUNTER — Telehealth: Payer: Self-pay | Admitting: Family Medicine

## 2022-01-27 NOTE — Telephone Encounter (Signed)
Pt is calling and getting ready to have an injection in her back on 02-05-2022 and has a question concerning stopping her coumadin ?

## 2022-01-27 NOTE — Telephone Encounter (Signed)
Pt requesting 5 day hold of warfarin. Pt did well with a 5 day hold and no lovenox bridge with her last injection Dec 2022.  ? ? ?Pt will take last dose of warfarin on 01/31/22 and will restart warfarin the day after the procedure if authorized by provider performing injection. Pt will have increased dose for three days after the procedure and recheck of INR in 5-7 days.  ?Current warfarin dosing; 1 tablet (2 mg) daily except take 1/2 tablet (1 mg) on Wednesdays and Saturdays.  ? ?Hold scheduled for warfarin for back injection on 02/05/22; ? ?5/7: Take last dose of warfarin ?5/8: NO warfarin ?5/9: NO warfarin ?5/10: NO warfarin ?5/11: NO warfarin ? ?5/12: Procedure; NO warfarin ? ?5/13: Take 1 tablet (2 mg) warfarin ?5/14: Take 1 1/2 tablets (3 mg) warfarin ?5/15: Take 1 1/2 tablets (3 mg) warfarin ?5/16: Resume normal warfarin dosage ?5/17: Recheck INR at [facility] in the AM after her husbands apt with PCP at [place] ? ?Contacted pt and advised of dosing schedule and recheck of INR at [facility] so she can get INR checked after her husband's apt with PCP at [facility].  ?Pt wrote instructions on calendar and readback instructions. Pt did not have any further  questions. Advised if anything changes to contact anticoagulation clinic. Pt verbalized understanding.  ?

## 2022-01-28 NOTE — Telephone Encounter (Signed)
noted 

## 2022-01-29 ENCOUNTER — Ambulatory Visit (HOSPITAL_BASED_OUTPATIENT_CLINIC_OR_DEPARTMENT_OTHER): Payer: Medicare Other | Admitting: Family

## 2022-01-29 ENCOUNTER — Encounter (HOSPITAL_BASED_OUTPATIENT_CLINIC_OR_DEPARTMENT_OTHER): Payer: Self-pay | Admitting: Family

## 2022-01-29 VITALS — BP 112/64 | HR 68 | Ht 63.0 in | Wt 142.8 lb

## 2022-01-29 DIAGNOSIS — I5032 Chronic diastolic (congestive) heart failure: Secondary | ICD-10-CM

## 2022-01-29 DIAGNOSIS — Z952 Presence of prosthetic heart valve: Secondary | ICD-10-CM | POA: Diagnosis not present

## 2022-01-29 DIAGNOSIS — I10 Essential (primary) hypertension: Secondary | ICD-10-CM | POA: Diagnosis not present

## 2022-01-29 DIAGNOSIS — I2510 Atherosclerotic heart disease of native coronary artery without angina pectoris: Secondary | ICD-10-CM

## 2022-01-29 DIAGNOSIS — E782 Mixed hyperlipidemia: Secondary | ICD-10-CM | POA: Diagnosis not present

## 2022-01-29 MED ORDER — AMIODARONE HCL 200 MG PO TABS: ORAL_TABLET | ORAL | 3 refills | Status: DC

## 2022-01-29 MED ORDER — LOSARTAN POTASSIUM 25 MG PO TABS: 25.0000 mg | ORAL_TABLET | Freq: Every day | ORAL | 3 refills | Status: DC

## 2022-01-29 NOTE — Patient Instructions (Addendum)
Medication Instructions:  ?Your physician has recommended you make the following change in your medication:  ? ?START Amiodarone 251m twice daily for 2 weeks then transition to 2075mdaily. ? ?CONTINUE your other medications ?  ?*If you need a refill on your cardiac medications before your next appointment, please call your pharmacy* ? ?Lab Work: ?None ordered today.  ? ?Testing/Procedures: ?None ordered today.  ? ?Follow-Up: ?[facility]  As part of our continuing mission to provide you with exceptional heart care, we have created designated Provider Care Teams.  These Care Teams include your primary Cardiologist (physician) and Advanced Practice Providers (APPs -  Physician Assistants and Nurse Practitioners) who all work together to provide you with the care you need, when you need it. ? ?We recommend signing up for the patient portal called "MyChart".  Sign up information is provided on this After Visit Summary.  MyChart is used to connect with patients for Virtual Visits (Telemedicine).  Patients are able to view lab/test results, encounter notes, upcoming appointments, etc.  Non-urgent messages can be sent to your provider as well.   ?To learn more about what you can do with MyChart, go to [email or web address]  ? ?Your next appointment:   ?As scheduled with Dr. RaOval Linsey ?Other Instructions ? ?Recommend calling Dr. WeMelvyn Novaso schedule pulmonology follow up. Their phone number is [phone] ? ?Start walking regimen. Start by walking either to the mailbox or around your home once per day. After a few days, increase to twice per day. Gradually increasing your exercise will help your energy level. ? ?Recommend weighing daily and keeping a log. Please call our office if you have weight gain of 2 pounds overnight or 5 pounds in 1 week. If you hit 140 pounds please call the office.  ? ?Date ? Time Weight  ? ?    ? ?    ? ?    ? ?    ? ?    ? ?    ? ?    ? ?     ?  ?Exercises to do While Sitting ? ?Exercises that you do while sitting (chair exercises) can give you many of the same benefits as full exercise. Benefits include strengthening your heart, burning calories, and keeping muscles and joints healthy. Exercise can also improve your mood and help with depression and anxiety. ?You may benefit from chair exercises if you are unable to do standing exercises due to: ?Diabetic foot pain. ?Obesity. ?Illness. ?Arthritis. ?Recovery from surgery or injury. ?Breathing problems. ?Balance problems. ?Another type of disability. ?Before starting chair exercises, check with your health care provider or a physical therapist to find out how much exercise you can tolerate and which exercises are safe for you. If your health care provider approves: ?Start out slowly and build up over time. Aim to work up to about 10-20 minutes for each exercise session. ?Make exercise part of your daily routine. ?Drink water when you exercise. Do not wait until you are thirsty. Drink every 10-15 minutes. ?Stop exercising right away if you have pain, nausea, shortness of breath, or dizziness. ?If you are exercising in a wheelchair, make sure to lock the wheels. ?Ask your health care provider whether you can do tai chi or yoga. Many positions in these mind-body exercises can be modified to do while seated. ?Warm-up ?Before starting other exercises: ?Sit up as straight as you can. Have your knees bent at  90 degrees, which is the shape of the capital letter "L." Keep your feet flat on the floor. ?Sit at the front edge of your chair, if you can. ?Pull in (tighten) the muscles in your abdomen and stretch your spine and neck as straight as you can. Hold this position for a few minutes. ?Breathe in and out evenly. Try to concentrate on your breathing, and relax your mind. ?Stretching ?Exercise A: Arm stretch ?Hold your arms out straight in front of your body. ?Bend your hands at the wrist with your fingers  pointing up, as if signaling someone to stop. Notice the slight tension in your forearms as you hold the position. ?Keeping your arms out and your hands bent, rotate your hands outward as far as you can and hold this stretch. Aim to have your thumbs pointing up and your pinkie fingers pointing down. ?Slowly repeat arm stretches for one minute as tolerated. ?Exercise B: Leg stretch ?If you can move your legs, try to "draw" letters on the floor with the toes of your foot. Write your name with one foot. ?Write your name with the toes of your other foot. ?Slowly repeat the movements for one minute as tolerated. ?Exercise C: Reach for the sky ?Reach your hands as far over your head as you can to stretch your spine. ?Move your hands and arms as if you are climbing a rope. ?Slowly repeat the movements for one minute as tolerated. ?Range of motion exercises ?Exercise A: Shoulder roll ?Let your arms hang loosely at your sides. ?Lift just your shoulders up toward your ears, then let them relax back down. ?When your shoulders feel loose, rotate your shoulders in backward and forward circles. ?Do shoulder rolls slowly for one minute as tolerated. ?Exercise B: March in place ?As if you are marching, pump your arms and lift your legs up and down. Lift your knees as high as you can. ?If you are unable to lift your knees, just pump your arms and move your ankles and feet up and down. ?March in place for one minute as tolerated. ?Exercise C: Seated jumping jacks ?Let your arms hang down straight. ?Keeping your arms straight, lift them up over your head. Aim to point your fingers to the ceiling. ?While you lift your arms, straighten your legs and slide your heels along the floor to your sides, as wide as you can. ?As you bring your arms back down to your sides, slide your legs back together. ?If you are unable to use your legs, just move your arms. ?Slowly repeat seated jumping jacks for one minute as tolerated. ?Strengthening  exercises ?Exercise A: Shoulder squeeze ?Hold your arms straight out from your body to your sides, with your elbows bent and your fists pointed at the ceiling. ?Keeping your arms in the bent position, move them forward so your elbows and forearms meet in front of your face. ?Open your arms back out as wide as you can with your elbows still bent, until you feel your shoulder blades squeezing together. Hold for 5 seconds. ?Slowly repeat the movements forward and backward for one minute as tolerated. ? ? ?  ?

## 2022-01-29 NOTE — Progress Notes (Signed)
? ?Office Visit  ?  ?Patient Name: [name] ?Date of Encounter: 01/29/2022 ? ?PCP:  [name], MD ?  ?[place]  ?Cardiologist:  [name], MD  ?Advanced Practice Provider:  No care team member to display ?Electrophysiologist:  None  ? ?Chief Complaint  ?  ?Catherine [name] is a 87 y.o. 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  ? ?Past Medical History  ?  ?Past Medical History:  ?Diagnosis Date  ? Acute on chronic diastolic heart failure ([place])   ? Aortic stenosis, mild 07/26/2017  ? Arthritis   ? Asthma   ? last attack 02/2015  ? Atrial fibrillation, chronic ([place])   ? Cellulitis of left lower extremity 08/17/2016  ? Ulcer associated with severe venous insufficiency  ? Chronic anticoagulation   ? Chronic diastolic CHF (congestive heart failure) ([place])   ? Chronic kidney disease   ? "RIGHT MANY KIDNEY INFECTIONS AND STONES"  ? COPD (chronic obstructive pulmonary disease) ([place])   ? Coronary artery disease   ? Dizziness   ? H/O: rheumatic fever   ? Heart murmur   ? Hypertension   ? PONV (postoperative nausea and vomiting)   ? ' SOMETIMES', BUT NOT ALWAYS"  ? S/P Maze operation for atrial fibrillation 10/14/2016  ? Complete bilateral atrial lesion set using cryothermy and bipolar radiofrequency ablation - atrial appendage was not treated due to previous surgical procedure (open mitral commissurotomy)  ? S/P mitral valve replacement with bioprosthetic valve 10/14/2016  ? 29 mm Medtronic Mosaic porcine bioprosthetic tissue valve  ? Tricuspid regurgitation 07/14/2021  ? UTI (urinary tract infection)   ? Valvular heart disease   ? Has mitral stenosis with prior mitral commissurotomy in 1970  ? ?Past Surgical History:  ?Procedure Laterality  Date  ? ABDOMINAL HYSTERECTOMY  1983  ? endometriosis  ? APPENDECTOMY    ? BACK SURGERY    ? neurosurgery x2  ? CARDIAC CATHETERIZATION    ? CARDIAC CATHETERIZATION N/A 08/03/2016  ? Procedure: Right/Left Heart Cath and Coronary Angiography;  Surgeon: Peter M Martinique, MD;  Location: [place] [facility];  Service: Cardiovascular;  Laterality: N/A;  ? cataract surg    ? CHOLECYSTECTOMY N/A 05/30/2015  ? Procedure: LAPAROSCOPIC CHOLECYSTECTOMY WITH INTRAOPERATIVE CHOLANGIOGRAM;  Surgeon: Excell Seltzer, MD;  Location: [place];  Service: General;  Laterality: N/A;  ? COLONOSCOPY    ? CORONARY ARTERY BYPASS GRAFT N/A 10/14/2016  ? Procedure: CORONARY ARTERY BYPASS GRAFTING (CABG);  Surgeon: Rexene Alberts, MD;  Location: [place];  Service: Open Heart Surgery;  Laterality: N/A;  ? EYE SURGERY    ? MAZE N/A 10/14/2016  ? Procedure: MAZE;  Surgeon: Rexene Alberts, MD;  Location: [place];  Service: Open Heart Surgery;  Laterality: N/A;  ? MITRAL VALVE REPLACEMENT N/A 10/14/2016  ? Procedure: REDO MITRAL VALVE REPLACEMENT (MVR);  Surgeon: Rexene Alberts, MD;  Location: [place];  Service: Open Heart Surgery;  Laterality: N/A;  ? MITRAL VALVE SURGERY Left 1970  ? Open mitral commissurotomy via left thoracotomy approach  ? TEE WITHOUT CARDIOVERSION N/A 07/05/2016  ? Procedure: TRANSESOPHAGEAL ECHOCARDIOGRAM (TEE);  Surgeon: [name], MD;  Location: [place];  Service: Cardiovascular;  Laterality: N/A;  ? TEE WITHOUT CARDIOVERSION N/A 10/14/2016  ?  Procedure: TRANSESOPHAGEAL ECHOCARDIOGRAM (TEE);  Surgeon: Rexene Alberts, MD;  Location: [place];  Service: Open Heart Surgery;  Laterality: N/A;  ? ?Allergies ? ?Allergies  ?Allergen Reactions  ? Aldactone [Spironolactone] Other (See Comments)  ?  dyspnea  ? Amoxicillin Palpitations  ?  Tachycardia ?Has patient had a PCN reaction causing immediate rash, facial/tongue/throat swelling, SOB or lightheadedness with hypotension: no ?Has patient had a PCN reaction causing severe rash  involving mucus membranes or skin necrosis: {no ?Has patient had a PCN reaction that required hospitalization {no ?Has patient had a PCN reaction occurring within the last 10 years: {yes ?If all of the above answers are "NO", then may proceed with Cephalosporin use.  ? Diltiazem Other (See Comments)  ?  Causing headaches ?  ? Flagyl [Metronidazole Hcl] Other (See Comments)  ?  Causing headaches ? ?  ? Flovent [Fluticasone Propionate] Other (See Comments)  ?  Leg cramps  ? Gabapentin Swelling  ? Lyrica [Pregabalin] Swelling  ? Quinidine Diarrhea and Other (See Comments)  ?  Fever diarrhea  ? Simvastatin Other (See Comments)  ?  Leg pain, myalgia  ? Tramadol Nausea Only  ? Verapamil Other (See Comments)  ?  myalgias  ? Amlodipine   ?  Low extremity edema  ? Crestor [Rosuvastatin]   ?  myalgia  ? Livalo [Pitavastatin]   ?  myalgias  ? Zetia [Ezetimibe]   ?  LEG CRAMPS ?  ? Benazepril Hcl Cough  ? Ciprofloxacin Diarrhea  ? Codeine Nausea Only  ? Nitrofurantoin Monohyd Macro Nausea Only  ? ? ?History of Present Illness  ?  ?Catherine [name] is a 87 y.o. 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  ? ?History of rheumatic MV disease and underwent mitral commissurotomy in the 1970s. 09/2017 she underwent bioprosthetic MVR, CABGx1, and bilateral MAZE procedure with Dr. Roxy Manns. ? ?Echo 09/2019 LVEF 65-70%, mild LVH, no RWMA, bioprosthetic mitral valve functioning appropriately, mild AS, mild pulmonic regurgitation.  ? ?She has been evaluated by atrial fibrillation clinic for question of atrial fibrillation vs long PR which remained unclear. However, it was decided that NSR would not be possible to maintain if she were in atrial fib.  ? ?She has had persistent fatigue since her  surgery. She was seen 11/2020 by Dr. Oval Linsey with improvement in abdominal bloating after transition from Lasix to Torsemide. Her persistent LE edema was thought to be due to venous insufficiency. Unable to tolerate compression socks and declined referral to [facility]. ? ?Hospitalized 2/22 3 with COPD exacerbation and HFpEF.  Echo 10/2071 LVEF 60 to 65%, RV mildly reduced, RVSP 58 mmHg, moderate BAE, normal-appearing bioprosthetic MVR, moderate TR.  She was admitted 01/05/22 for acute on chronic diastolic heart failure with acute hypoxic respiratory failure and COPD exacerbation.  She was diuresed with IV Lasix.  Started on low-dose losartan.  Episodes of atrial fibrillation VR during admission felt secondary to CHF exacerbations.  She developed bradycardia with heart rate in the 40s and pauses up to 2.4 seconds while on metoprolol XL 50 mg daily.  It was reduced to 37.5 mg daily.  Amiodarone was considered to reduce risk of intermittent RVR versus pacemaker for tachybradycardia syndrome. ? ?Seen in advanced heart failure clinic 01/18/2022 feeling much better since discharge.  Lower extremity edema resolved, dyspnea improved but not yet resolved.  Weight 135-137 since discharge.  She was taking 25 mg of metoprolol XL.  She noted occasional palpitations.  She was possibly dry on exam with Reds vest 23%.  Torsemide 40 mg a.m. continued and p.m. dose decreased to 20 mg daily.  SGLT2i deferred due to low volume status.  She saw PCP last week and physical therapy was ordered. ? ?She presents today for follow up with her husband. She becomes tearful when reviewing her discharge paperwork from the hospital that has the title 'living with heart failure'. We reviewed that this was not a new diagnosis and reassurance provided regarding lifestyle changes and medications to prevent exacerbations. Not as active over past months, hopeful to return to [employer]. Weight at home stable 137-139 pounds. LE resolved. Exertional dyspnea  gradually improving. No chest pain, orthopnea, PND. Does note palpitations and that she can see her heart racing in her chest. Long discussion regarding possible addition of Amiodarone and SGLT2i. ? ?EKGs/Labs/Other

## 2022-02-01 ENCOUNTER — Ambulatory Visit: Payer: Medicare Other

## 2022-02-02 ENCOUNTER — Other Ambulatory Visit (HOSPITAL_BASED_OUTPATIENT_CLINIC_OR_DEPARTMENT_OTHER): Payer: Self-pay | Admitting: *Deleted

## 2022-02-02 DIAGNOSIS — I350 Nonrheumatic aortic (valve) stenosis: Secondary | ICD-10-CM | POA: Diagnosis not present

## 2022-02-02 DIAGNOSIS — Z952 Presence of prosthetic heart valve: Secondary | ICD-10-CM | POA: Diagnosis not present

## 2022-02-02 DIAGNOSIS — I2510 Atherosclerotic heart disease of native coronary artery without angina pectoris: Secondary | ICD-10-CM | POA: Diagnosis not present

## 2022-02-02 DIAGNOSIS — J441 Chronic obstructive pulmonary disease with (acute) exacerbation: Secondary | ICD-10-CM | POA: Diagnosis not present

## 2022-02-02 DIAGNOSIS — I482 Chronic atrial fibrillation, unspecified: Secondary | ICD-10-CM | POA: Diagnosis not present

## 2022-02-02 DIAGNOSIS — N189 Chronic kidney disease, unspecified: Secondary | ICD-10-CM | POA: Diagnosis not present

## 2022-02-02 DIAGNOSIS — J9601 Acute respiratory failure with hypoxia: Secondary | ICD-10-CM | POA: Diagnosis not present

## 2022-02-02 DIAGNOSIS — Z9049 Acquired absence of other specified parts of digestive tract: Secondary | ICD-10-CM | POA: Diagnosis not present

## 2022-02-02 DIAGNOSIS — Z87440 Personal history of urinary (tract) infections: Secondary | ICD-10-CM | POA: Diagnosis not present

## 2022-02-02 DIAGNOSIS — I078 Other rheumatic tricuspid valve diseases: Secondary | ICD-10-CM | POA: Diagnosis not present

## 2022-02-02 DIAGNOSIS — Z7901 Long term (current) use of anticoagulants: Secondary | ICD-10-CM | POA: Diagnosis not present

## 2022-02-02 DIAGNOSIS — I130 Hypertensive heart and chronic kidney disease with heart failure and stage 1 through stage 4 chronic kidney disease, or unspecified chronic kidney disease: Secondary | ICD-10-CM | POA: Diagnosis not present

## 2022-02-02 DIAGNOSIS — Z87442 Personal history of urinary calculi: Secondary | ICD-10-CM | POA: Diagnosis not present

## 2022-02-02 DIAGNOSIS — I5032 Chronic diastolic (congestive) heart failure: Secondary | ICD-10-CM | POA: Diagnosis not present

## 2022-02-02 DIAGNOSIS — M1990 Unspecified osteoarthritis, unspecified site: Secondary | ICD-10-CM | POA: Diagnosis not present

## 2022-02-02 DIAGNOSIS — Z951 Presence of aortocoronary bypass graft: Secondary | ICD-10-CM | POA: Diagnosis not present

## 2022-02-02 DIAGNOSIS — Z9981 Dependence on supplemental oxygen: Secondary | ICD-10-CM | POA: Diagnosis not present

## 2022-02-02 MED ORDER — ROSUVASTATIN CALCIUM 5 MG PO TABS: ORAL_TABLET | ORAL | 2 refills | Status: DC

## 2022-02-02 NOTE — Telephone Encounter (Signed)
Rx(s) sent to pharmacy electronically.  

## 2022-02-04 ENCOUNTER — Telehealth: Payer: Self-pay | Admitting: Pharmacist

## 2022-02-04 DIAGNOSIS — Z7901 Long term (current) use of anticoagulants: Secondary | ICD-10-CM | POA: Diagnosis not present

## 2022-02-04 DIAGNOSIS — J9601 Acute respiratory failure with hypoxia: Secondary | ICD-10-CM | POA: Diagnosis not present

## 2022-02-04 DIAGNOSIS — Z952 Presence of prosthetic heart valve: Secondary | ICD-10-CM | POA: Diagnosis not present

## 2022-02-04 DIAGNOSIS — J441 Chronic obstructive pulmonary disease with (acute) exacerbation: Secondary | ICD-10-CM | POA: Diagnosis not present

## 2022-02-04 DIAGNOSIS — I078 Other rheumatic tricuspid valve diseases: Secondary | ICD-10-CM | POA: Diagnosis not present

## 2022-02-04 DIAGNOSIS — Z9981 Dependence on supplemental oxygen: Secondary | ICD-10-CM | POA: Diagnosis not present

## 2022-02-04 DIAGNOSIS — M1990 Unspecified osteoarthritis, unspecified site: Secondary | ICD-10-CM | POA: Diagnosis not present

## 2022-02-04 DIAGNOSIS — I350 Nonrheumatic aortic (valve) stenosis: Secondary | ICD-10-CM | POA: Diagnosis not present

## 2022-02-04 DIAGNOSIS — I482 Chronic atrial fibrillation, unspecified: Secondary | ICD-10-CM | POA: Diagnosis not present

## 2022-02-04 DIAGNOSIS — I5032 Chronic diastolic (congestive) heart failure: Secondary | ICD-10-CM | POA: Diagnosis not present

## 2022-02-04 DIAGNOSIS — Z951 Presence of aortocoronary bypass graft: Secondary | ICD-10-CM | POA: Diagnosis not present

## 2022-02-04 DIAGNOSIS — Z87442 Personal history of urinary calculi: Secondary | ICD-10-CM | POA: Diagnosis not present

## 2022-02-04 DIAGNOSIS — I2510 Atherosclerotic heart disease of native coronary artery without angina pectoris: Secondary | ICD-10-CM | POA: Diagnosis not present

## 2022-02-04 DIAGNOSIS — Z9049 Acquired absence of other specified parts of digestive tract: Secondary | ICD-10-CM | POA: Diagnosis not present

## 2022-02-04 DIAGNOSIS — I130 Hypertensive heart and chronic kidney disease with heart failure and stage 1 through stage 4 chronic kidney disease, or unspecified chronic kidney disease: Secondary | ICD-10-CM | POA: Diagnosis not present

## 2022-02-04 DIAGNOSIS — N189 Chronic kidney disease, unspecified: Secondary | ICD-10-CM | POA: Diagnosis not present

## 2022-02-04 DIAGNOSIS — Z87440 Personal history of urinary (tract) infections: Secondary | ICD-10-CM | POA: Diagnosis not present

## 2022-02-04 NOTE — Chronic Care Management (AMB) (Signed)
Chronic Care Management Pharmacy Assistant   Name: Catherine [name]  MRN: [id number] DOB: 1935/01/31  Reason for Encounter: Disease State / Hypertension and Congestive Heart Failure Assessment Call   Conditions to be addressed/monitored: CHF and HTN  Recent office visits:  01/22/2022 Catherine Littler MD - Patient was seen for Acute on chronic combined systolic and diastolic congestive heart failure and additional issues. No mediation changes. No follow up noted.  01/18/2022 Catherine An RN - Anti-coagulation visit  12/21/2021 Catherine An RN - Anti-coagulation visit  12/21/2021 Catherine Littler MD - Patient was seen for bilateral leg edema and additional issues.No medication changes. Follow up in 1 month.  Recent consult visits:  01/29/2022 Catherine Montana NP - Patient was seen for Chronic diastolic heart failure and additional issues. Started Amiodarone 200 mg 1 tablet twice daily for 14 days them once daily. Discontinued Robitussin DM, Guaifenesin, Mupirocin and Ondansetron. No follow up noted.   Hospital visits:  Admitted to [name] on 01/05/2022 due to Acute respiratory failure with hypoxia  secondary to acute on chronic COPD and diastolic congestive heart failure exacerbation. Discharge date was 01/12/2022.  New?Medications Started at [facility] Discharge:?? benzonatate (Tessalon Perles) guaiFENesin-dextromethorphan (ROBITUSSIN DM) losartan (COZAAR) predniSONE (DELTASONE) Medication Changes at Hospital Discharge: albuterol (VENTOLIN HFA) albuterol (PROVENTIL) metoprolol succinate (TOPROL-XL) Medications Discontinued at Hospital Discharge: No medications discontinued Medications that remain the same after Hospital Discharge:??  -All other medications will remain the same.    Medications: Outpatient Encounter Medications as of 02/04/2022  Medication Sig   acetaminophen (TYLENOL) 500 MG tablet Take 1,000 mg by mouth every 6 (six) hours as needed for  mild pain.   albuterol (PROVENTIL) (2.5 MG/3ML) 0.083% nebulizer solution Take 3 mLs (2.5 mg total) by nebulization every 4 (four) hours as needed for wheezing or shortness of breath.   albuterol (VENTOLIN HFA) 108 (90 Base) MCG/ACT inhaler INHALE 2 PUFFS INTO THE LUNGS EVERY 4 HOURS AS NEEDED FOR AHEEZING OR SHORTNESS OF BREATH   amiodarone (PACERONE) 200 MG tablet Take 1 tablet (200 mg total) by mouth 2 (two) times daily for 14 days, THEN 1 tablet (200 mg total) daily.   benzonatate (TESSALON PERLES) 100 MG capsule Take 1 capsule (100 mg total) by mouth every 6 (six) hours as needed for cough.   cholecalciferol (VITAMIN D3) 25 MCG (1000 UNIT) tablet Take 1,000 Units by mouth daily.   diazepam (VALIUM) 5 MG tablet TAKE ONE TABLET AT BEDTIME   doxazosin (CARDURA) 8 MG tablet TAKE 1 TABLET DAILY   losartan (COZAAR) 25 MG tablet Take 1 tablet (25 mg total) by mouth daily.   metoprolol succinate (TOPROL-XL) 25 MG 24 hr tablet Take 1 tablet (25 mg total) by mouth daily.   Multiple Vitamins-Minerals (HAIR/SKIN/NAILS/BIOTIN PO) Take 1 tablet by mouth daily.   Multiple Vitamins-Minerals (MULTIVITAMIN ADULT EXTRA C) CHEW Chew 2 tablets by mouth daily.   potassium chloride (KLOR-CON) 10 MEQ tablet Take 4 tablets (40 mEq total) by mouth 2 (two) times daily.   rosuvastatin (CRESTOR) 5 MG tablet Take one tablet every Monday, wednesday Friday and Sunday   SYMBICORT 80-4.5 MCG/ACT inhaler INHALE 2 PUFFS TWICE DAILY   torsemide (DEMADEX) 20 MG tablet Take 2 tablets (40 mg total) by mouth in the morning AND 1 tablet (20 mg total) every evening. Can take extra 1 tab ('20mg'$ ) as needed for weight gain.Marland Kitchen   warfarin (COUMADIN) 2 MG tablet TAKE ONE TABLET DAILY BY MOUTH EXCEPT TAKE 1/2 TABLET ON WEDNESDAYS AND SATURDAYS  OR AS DIRECTED BY ANTICOAGULATION CLINIC   No facility-administered encounter medications on file as of 02/04/2022.  Fill History: albuterol sulfate 2.5 mg/3 mL (0.083 %) solution for nebulization  01/12/2022 15   amiodarone 200 mg tablet 01/29/2022 90   doxazosin 8 mg tablet 01/21/2022 90   losartan 25 mg tablet 01/12/2022 30   metoprolol succinate ER 25 mg tablet,extended release 24 hr 12/30/2021 90   potassium chloride ER 10 mEq tablet,extended release 12/30/2021 90   ROSUVASTATIN CALCIUM  5 MG TABS 02/04/2022 90   torsemide 20 mg tablet 01/21/2022 29   warfarin 2 mg tablet 01/21/2022 100   diazepam 5 mg tablet 02/01/2022 30   Reviewed chart prior to disease state call. Spoke with patient regarding BP  Recent Office Vitals: BP Readings from Last 3 Encounters:  01/29/22 112/64  01/22/22 116/60  01/18/22 120/78   Pulse Readings from Last 3 Encounters:  01/29/22 68  01/22/22 68  01/18/22 (!) 55    Wt Readings from Last 3 Encounters:  01/29/22 142 lb 12.8 oz (64.8 kg)  01/22/22 140 lb (63.5 kg)  01/18/22 139 lb (63 kg)     Kidney Function Lab Results  Component Value Date/Time   CREATININE 0.96 01/18/2022 01:08 PM   CREATININE 0.77 01/12/2022 12:52 AM   CREATININE 0.94 (H) 07/29/2020 09:50 AM   CREATININE 0.71 07/09/2020 02:03 PM   GFR 62.02 12/21/2021 02:37 PM   GFRNONAA 58 (L) 01/18/2022 01:08 PM   GFRAA 67 10/29/2020 12:10 PM       Latest Ref Rng & Units 01/18/2022    1:08 PM 01/12/2022   12:52 AM 01/11/2022    1:20 AM  BMP  Glucose 70 - 99 mg/dL 118   94   96    BUN 8 - 23 mg/dL '28   22   23    '$ Creatinine 0.44 - 1.00 mg/dL 0.96   0.77   0.91    Sodium 135 - 145 mmol/L 141   136   140    Potassium 3.5 - 5.1 mmol/L 3.5   3.6   3.0    Chloride 98 - 111 mmol/L 104   97   103    CO2 22 - 32 mmol/L '26   27   29    '$ Calcium 8.9 - 10.3 mg/dL 9.0   8.4   9.0      Current antihypertensive regimen:  Doxazosin 8 mg every evening  Torsemide 40 mg twice daily Metoprolol XL 25 mg every evening  How often are you checking your Blood Pressure? Patient is checking blood pressures daily  Current home BP readings: 02/02/22 110/50, 02/01/22 99/47, 01/31/22  105/52, 05/06/23100/49, 01/28/22 102/46  Is patient taking fluid pill: She is currently taking it twice daily as she had some swelling in her feet, she states it is getting better.   Is patient weighing herself daily: Patient is weighing herself daily, her last 5 days readings were, 140,141,141,140 and 140  What recent interventions/DTPs have been made by any provider to improve Blood Pressure control since last CPP Visit: No recent interventions  Any recent hospitalizations or ED visits since last visit with CPP? Patient had a recent hospitalization on 01/05/2022 for Acute respiratory failure with hypoxia  secondary to acute on chronic COPD and diastolic congestive heart failure exacerbation  What diet changes have been made to improve Blood Pressure Control?  Patient follows a low sodium diet Breakfast - patient will have oatmeal with peanut butter, orange and  1/2 banana or boiled egg with fruit Lunch - patient will have hamburger or something light Dinner - patient will have vegetables, fruit and sometimes a meat  What exercise is being done to improve your Blood Pressure Control?  Patient is limited due to back pain   Adherence Review: Is the patient currently on ACE/ARB medication? Yes Does the patient have >5 day gap between last estimated fill dates? No   Care Gaps: AWV - completed on 04/07/21 Last BP - 128/60 on 11/30/2021 Covid booster - overdue   Star Rating Drugs: Rosuvastatin '5mg'$  - last filled on 02/04/2022 90 DS at [facility]. Losartan 25 mg - last filled 01/12/2022 30 DS at [place] Pharmacist Assistant [phone]

## 2022-02-05 DIAGNOSIS — M5416 Radiculopathy, lumbar region: Secondary | ICD-10-CM | POA: Diagnosis not present

## 2022-02-08 DIAGNOSIS — Z7901 Long term (current) use of anticoagulants: Secondary | ICD-10-CM | POA: Diagnosis not present

## 2022-02-08 DIAGNOSIS — M1990 Unspecified osteoarthritis, unspecified site: Secondary | ICD-10-CM | POA: Diagnosis not present

## 2022-02-08 DIAGNOSIS — J9601 Acute respiratory failure with hypoxia: Secondary | ICD-10-CM | POA: Diagnosis not present

## 2022-02-08 DIAGNOSIS — I350 Nonrheumatic aortic (valve) stenosis: Secondary | ICD-10-CM | POA: Diagnosis not present

## 2022-02-08 DIAGNOSIS — Z9049 Acquired absence of other specified parts of digestive tract: Secondary | ICD-10-CM | POA: Diagnosis not present

## 2022-02-08 DIAGNOSIS — Z952 Presence of prosthetic heart valve: Secondary | ICD-10-CM | POA: Diagnosis not present

## 2022-02-08 DIAGNOSIS — Z87442 Personal history of urinary calculi: Secondary | ICD-10-CM | POA: Diagnosis not present

## 2022-02-08 DIAGNOSIS — Z9981 Dependence on supplemental oxygen: Secondary | ICD-10-CM | POA: Diagnosis not present

## 2022-02-08 DIAGNOSIS — Z951 Presence of aortocoronary bypass graft: Secondary | ICD-10-CM | POA: Diagnosis not present

## 2022-02-08 DIAGNOSIS — I2510 Atherosclerotic heart disease of native coronary artery without angina pectoris: Secondary | ICD-10-CM | POA: Diagnosis not present

## 2022-02-08 DIAGNOSIS — I5032 Chronic diastolic (congestive) heart failure: Secondary | ICD-10-CM | POA: Diagnosis not present

## 2022-02-08 DIAGNOSIS — I130 Hypertensive heart and chronic kidney disease with heart failure and stage 1 through stage 4 chronic kidney disease, or unspecified chronic kidney disease: Secondary | ICD-10-CM | POA: Diagnosis not present

## 2022-02-08 DIAGNOSIS — I078 Other rheumatic tricuspid valve diseases: Secondary | ICD-10-CM | POA: Diagnosis not present

## 2022-02-08 DIAGNOSIS — J441 Chronic obstructive pulmonary disease with (acute) exacerbation: Secondary | ICD-10-CM | POA: Diagnosis not present

## 2022-02-08 DIAGNOSIS — I482 Chronic atrial fibrillation, unspecified: Secondary | ICD-10-CM | POA: Diagnosis not present

## 2022-02-08 DIAGNOSIS — Z87440 Personal history of urinary (tract) infections: Secondary | ICD-10-CM | POA: Diagnosis not present

## 2022-02-08 DIAGNOSIS — N189 Chronic kidney disease, unspecified: Secondary | ICD-10-CM | POA: Diagnosis not present

## 2022-02-10 ENCOUNTER — Ambulatory Visit (INDEPENDENT_AMBULATORY_CARE_PROVIDER_SITE_OTHER): Payer: Medicare Other | Admitting: Family Medicine

## 2022-02-10 DIAGNOSIS — D6832 Hemorrhagic disorder due to extrinsic circulating anticoagulants: Secondary | ICD-10-CM

## 2022-02-10 DIAGNOSIS — T45515A Adverse effect of anticoagulants, initial encounter: Secondary | ICD-10-CM

## 2022-02-10 LAB — POCT INR: INR: 2.2 (ref 2.0–3.0)

## 2022-02-10 NOTE — Patient Instructions (Addendum)
Pre visit review using our clinic review tool, if applicable. No additional management support is needed unless otherwise documented below in the visit note. ? ?Continue 1 tablet daily except take 1/2 tablet on Wednesdays and Saturdays. Recheck in 1 weeks.  ?

## 2022-02-10 NOTE — Progress Notes (Signed)
I have reviewed and agree with note, evaluation, plan.   Mayley Lish, MD  

## 2022-02-10 NOTE — Progress Notes (Addendum)
Pt had back injection on 5/12 and had suspended warfarin and used booster dosing after the procedure for 3 days. ? ?Pt revealed today that she was started on amiodarone and losartan on 5/5. Will monitor weekly until warfarin dosing is stable with amiodarone.  ? ?Continue 1 tablet daily except take 1/2 tablet on Wednesdays and Saturdays. Recheck in 1 weeks.  ?

## 2022-02-11 DIAGNOSIS — J9601 Acute respiratory failure with hypoxia: Secondary | ICD-10-CM | POA: Diagnosis not present

## 2022-02-11 DIAGNOSIS — Z87440 Personal history of urinary (tract) infections: Secondary | ICD-10-CM | POA: Diagnosis not present

## 2022-02-11 DIAGNOSIS — Z87442 Personal history of urinary calculi: Secondary | ICD-10-CM | POA: Diagnosis not present

## 2022-02-11 DIAGNOSIS — J441 Chronic obstructive pulmonary disease with (acute) exacerbation: Secondary | ICD-10-CM | POA: Diagnosis not present

## 2022-02-11 DIAGNOSIS — Z9049 Acquired absence of other specified parts of digestive tract: Secondary | ICD-10-CM | POA: Diagnosis not present

## 2022-02-11 DIAGNOSIS — Z952 Presence of prosthetic heart valve: Secondary | ICD-10-CM | POA: Diagnosis not present

## 2022-02-11 DIAGNOSIS — Z7901 Long term (current) use of anticoagulants: Secondary | ICD-10-CM | POA: Diagnosis not present

## 2022-02-11 DIAGNOSIS — I5032 Chronic diastolic (congestive) heart failure: Secondary | ICD-10-CM | POA: Diagnosis not present

## 2022-02-11 DIAGNOSIS — I130 Hypertensive heart and chronic kidney disease with heart failure and stage 1 through stage 4 chronic kidney disease, or unspecified chronic kidney disease: Secondary | ICD-10-CM | POA: Diagnosis not present

## 2022-02-11 DIAGNOSIS — Z9981 Dependence on supplemental oxygen: Secondary | ICD-10-CM | POA: Diagnosis not present

## 2022-02-11 DIAGNOSIS — M1990 Unspecified osteoarthritis, unspecified site: Secondary | ICD-10-CM | POA: Diagnosis not present

## 2022-02-11 DIAGNOSIS — I350 Nonrheumatic aortic (valve) stenosis: Secondary | ICD-10-CM | POA: Diagnosis not present

## 2022-02-11 DIAGNOSIS — I078 Other rheumatic tricuspid valve diseases: Secondary | ICD-10-CM | POA: Diagnosis not present

## 2022-02-11 DIAGNOSIS — I2510 Atherosclerotic heart disease of native coronary artery without angina pectoris: Secondary | ICD-10-CM | POA: Diagnosis not present

## 2022-02-11 DIAGNOSIS — J449 Chronic obstructive pulmonary disease, unspecified: Secondary | ICD-10-CM | POA: Diagnosis not present

## 2022-02-11 DIAGNOSIS — I482 Chronic atrial fibrillation, unspecified: Secondary | ICD-10-CM | POA: Diagnosis not present

## 2022-02-11 DIAGNOSIS — N189 Chronic kidney disease, unspecified: Secondary | ICD-10-CM | POA: Diagnosis not present

## 2022-02-11 DIAGNOSIS — Z951 Presence of aortocoronary bypass graft: Secondary | ICD-10-CM | POA: Diagnosis not present

## 2022-02-15 ENCOUNTER — Ambulatory Visit (INDEPENDENT_AMBULATORY_CARE_PROVIDER_SITE_OTHER): Payer: Medicare Other

## 2022-02-15 DIAGNOSIS — D6832 Hemorrhagic disorder due to extrinsic circulating anticoagulants: Secondary | ICD-10-CM

## 2022-02-15 DIAGNOSIS — T45515A Adverse effect of anticoagulants, initial encounter: Secondary | ICD-10-CM

## 2022-02-15 LAB — POCT INR: INR: 3.2 — AB (ref 2.0–3.0)

## 2022-02-15 NOTE — Patient Instructions (Addendum)
Pre visit review using our clinic review tool, if applicable. No additional management support is needed unless otherwise documented below in the visit note.  Hold dose today and then continue 1 tablet daily except take 1/2 tablet on Wednesdays and Saturdays. Recheck in 1 weeks.

## 2022-02-15 NOTE — Progress Notes (Addendum)
Started amiodarone on 5/5. Pt reports after being asked about dosing that she took 1 1/2 tablets twice last week and that she misunderstood the instructions.  Hold dose today and then continue 1 tablet daily except take 1/2 tablet on Wednesdays and Saturdays. Recheck in 1 weeks.

## 2022-02-16 ENCOUNTER — Telehealth: Payer: Self-pay | Admitting: Family

## 2022-02-16 ENCOUNTER — Ambulatory Visit (INDEPENDENT_AMBULATORY_CARE_PROVIDER_SITE_OTHER): Payer: Medicare Other

## 2022-02-16 DIAGNOSIS — I4811 Longstanding persistent atrial fibrillation: Secondary | ICD-10-CM

## 2022-02-16 DIAGNOSIS — J441 Chronic obstructive pulmonary disease with (acute) exacerbation: Secondary | ICD-10-CM

## 2022-02-16 DIAGNOSIS — I2510 Atherosclerotic heart disease of native coronary artery without angina pectoris: Secondary | ICD-10-CM

## 2022-02-16 DIAGNOSIS — I5043 Acute on chronic combined systolic (congestive) and diastolic (congestive) heart failure: Secondary | ICD-10-CM

## 2022-02-16 DIAGNOSIS — I10 Essential (primary) hypertension: Secondary | ICD-10-CM

## 2022-02-16 NOTE — Chronic Care Management (AMB) (Signed)
Chronic Care Management   CCM RN Visit Note  02/16/2022 Name: Catherine [name] MRN: [id number] DOB: 1934/11/28  Subjective: [name] is a 87 y.o. year old female who is a primary care patient of Burchette, Alinda Sierras, MD. The care management team was consulted for assistance with disease management and care coordination needs.    Engaged with patient by telephone for follow up visit in response to provider referral for case management and/or care coordination services.   Consent to Services:  The patient was given information about Chronic Care Management services, agreed to services, and gave verbal consent prior to initiation of services.  Please see initial visit note for detailed documentation.   Patient agreed to services and verbal consent obtained.   Assessment: Review of patient past medical history, allergies, medications, health status, including review of consultants reports, laboratory and other test data, was performed as part of comprehensive evaluation and provision of chronic care management services.   SDOH (Social Determinants of Health) assessments and interventions performed:    CCM Care Plan  Allergies  Allergen Reactions   Aldactone [Spironolactone] Other (See Comments)    dyspnea   Amoxicillin Palpitations    Tachycardia Has patient had a PCN reaction causing immediate rash, facial/tongue/throat swelling, SOB or lightheadedness with hypotension: no Has patient had a PCN reaction causing severe rash involving mucus membranes or skin necrosis: {no Has patient had a PCN reaction that required hospitalization {no Has patient had a PCN reaction occurring within the last 10 years: {yes If all of the above answers are "NO", then may proceed with Cephalosporin use.   Diltiazem Other (See Comments)    Causing headaches    Flagyl [Metronidazole Hcl] Other (See Comments)    Causing headaches     Flovent [Fluticasone Propionate] Other (See Comments)    Leg cramps    Gabapentin Swelling   Lyrica [Pregabalin] Swelling   Quinidine Diarrhea and Other (See Comments)    Fever diarrhea   Simvastatin Other (See Comments)    Leg pain, myalgia   Tramadol Nausea Only   Verapamil Other (See Comments)    myalgias   Amlodipine     Low extremity edema   Crestor [Rosuvastatin]     myalgia   Livalo [Pitavastatin]     myalgias   Zetia [Ezetimibe]     LEG CRAMPS    Benazepril Hcl Cough   Ciprofloxacin Diarrhea   Codeine Nausea Only   Nitrofurantoin Monohyd Macro Nausea Only    Outpatient Encounter Medications as of 02/16/2022  Medication Sig   acetaminophen (TYLENOL) 500 MG tablet Take 1,000 mg by mouth every 6 (six) hours as needed for mild pain.   albuterol (PROVENTIL) (2.5 MG/3ML) 0.083% nebulizer solution Take 3 mLs (2.5 mg total) by nebulization every 4 (four) hours as needed for wheezing or shortness of breath.   albuterol (VENTOLIN HFA) 108 (90 Base) MCG/ACT inhaler INHALE 2 PUFFS INTO THE LUNGS EVERY 4 HOURS AS NEEDED FOR AHEEZING OR SHORTNESS OF BREATH   amiodarone (PACERONE) 200 MG tablet Take 1 tablet (200 mg total) by mouth 2 (two) times daily for 14 days, THEN 1 tablet (200 mg total) daily.   benzonatate (TESSALON PERLES) 100 MG capsule Take 1 capsule (100 mg total) by mouth every 6 (six) hours as needed for cough.   cholecalciferol (VITAMIN D3) 25 MCG (1000 UNIT) tablet Take 1,000 Units by mouth daily.   diazepam (VALIUM) 5 MG tablet TAKE ONE TABLET AT BEDTIME   doxazosin (CARDURA)  8 MG tablet TAKE 1 TABLET DAILY   losartan (COZAAR) 25 MG tablet Take 1 tablet (25 mg total) by mouth daily.   metoprolol succinate (TOPROL-XL) 25 MG 24 hr tablet Take 1 tablet (25 mg total) by mouth daily.   Multiple Vitamins-Minerals (HAIR/SKIN/NAILS/BIOTIN PO) Take 1 tablet by mouth daily.   Multiple Vitamins-Minerals (MULTIVITAMIN ADULT EXTRA C) CHEW Chew 2 tablets by mouth daily.   potassium chloride (KLOR-CON) 10 MEQ tablet Take 4 tablets (40 mEq total) by  mouth 2 (two) times daily.   rosuvastatin (CRESTOR) 5 MG tablet Take one tablet every Monday, wednesday Friday and Sunday   SYMBICORT 80-4.5 MCG/ACT inhaler INHALE 2 PUFFS TWICE DAILY   torsemide (DEMADEX) 20 MG tablet Take 2 tablets (40 mg total) by mouth in the morning AND 1 tablet (20 mg total) every evening. Can take extra 1 tab (46m) as needed for weight gain..Marland Kitchen  warfarin (COUMADIN) 2 MG tablet TAKE ONE TABLET DAILY BY MOUTH EXCEPT TAKE 1/2 TABLET ON WEDNESDAYS AND SATURDAYS OR AS DIRECTED BY ANTICOAGULATION CLINIC   No facility-administered encounter medications on file as of 02/16/2022.    Patient Active Problem List   Diagnosis Date Noted   Hypertension    Chronic kidney disease    Anxiety    Acute exacerbation of CHF (congestive heart failure) ([facility] 01/05/2022   Chest wall pain 11/02/2021   Nausea without vomiting 10/30/2021   Tricuspid regurgitation 07/14/2021   Protein-calorie malnutrition, severe 09/05/2019   Dehydration    Gastroenteritis    Enteritis 09/03/2019   COVID-19 08/27/2019   Dysuria 07/07/2019   Chronic anxiety 12/23/2017   Aortic stenosis, mild 07/26/2017   S/P Maze operation for atrial fibrillation 10/14/2016   S/P CABG x 1 10/14/2016   Cellulitis of left lower extremity 08/17/2016   Coronary artery disease    Chronic insomnia 07/12/2016   DOE (dyspnea on exertion) 03/26/2015   Warfarin[place]induced coagulopathy ([facility] [place] 03/16/2015   Valvular heart disease 03/16/2015   Nephrolithiasis 03/16/2015   Acute respiratory failure with hypoxia  secondary to acute on chronic COPD and diastolic congestive heart failure exacerbation 03/11/2015   Essential hypertension    RHEUMATIC MITRAL STENOSIS 01/05/2008   Rheumatic heart disease 01/05/2008   Atrial fibrillation ([facility] 01/05/2008    Conditions to be addressed/monitored:Atrial Fibrillation, CHF, CAD, HTN, HLD, and COPD  Care Plan : RN Care Manager Plan of Care  Updates made by SDimitri Ped RN since  02/16/2022 12:00 AM     Problem: Chronic Disease Management and Care Coordination Needs (CHF,COPD, Atrial Fib, HTN, HLD,CAD)   Priority: High     Long-Range Goal: Establish Plan of Care for Chronic Disease Management Needs (CHF,COPD, Atrial Fib, HTN, HLD,CAD)   Start Date: 11/06/2021  Expected End Date: 11/06/2022  Priority: High  Note:   Current Barriers:  Knowledge Deficits related to plan of care for management of Atrial Fibrillation, CHF, CAD, HTN, HLD, and COPD  Chronic Disease Management support and education needs related to Atrial Fibrillation, CHF, CAD, HTN, HLD, and COPD  States she has not been sleeping good and waking up very 2 hours since she started taking the Amiodarone and she is concerned about all of its side effects  States she is using her inhalers as ordered and has not needed to use her Albuterol inhaler.  States she is still using the incentive spirometer she got in the hospital.  States she is weighting daily and her weights have ranged 138-142. Denies any chest pains or increase in  swelling.  States she tries to not use salt and mostly cooks at home. States she has had some back pain since she came home from the hospital and she is trying to walk as she can to help it. States she no longer had PT coming but she is trying to do the exercises they gave her  RNCM Clinical Goal(s):  Patient will verbalize understanding of plan for management of Atrial Fibrillation, CHF, CAD, HTN, HLD, and COPD as evidenced by voiced adherence to plan of care verbalize basic understanding of  Atrial Fibrillation, CHF, CAD, HTN, HLD, and COPD disease process and self health management plan as evidenced by voiced understanding and teach back take all medications exactly as prescribed and will call provider for medication related questions as evidenced by readings within limits, voiced adherence to plan of care attend all scheduled medical appointments: Dr. [name] 03/02/22, Cardiology 03/04/22, CCM  PharmD 04/20/22 as evidenced by medical records demonstrate Improved adherence to prescribed treatment plan for Atrial Fibrillation, CHF, CAD, HTN, HLD, and COPD as evidenced by readings within limits, voiced adherence to plan of care continue to work with RN Care Manager to address care management and care coordination needs related to  Atrial Fibrillation, CHF, CAD, HTN, HLD, and COPD as evidenced by adherence to CM Team Scheduled appointments work with pharmacist to address polypharmacy and adherence related toAtrial Fibrillation, CHF, CAD, HTN, HLD, and COPD as evidenced by review or EMR and patient or pharmacist report through collaboration with RN Care manager, provider, and care team.   Interventions: 1:1 collaboration with primary care provider regarding development and update of comprehensive plan of care as evidenced by provider attestation and co-signature Inter-disciplinary care team collaboration (see longitudinal plan of care) Evaluation of current treatment plan related to  self management and patient's adherence to plan as established by provider   AFIB Interventions: (Status:  Goal on track:  Yes.) Long Term Goal   Counseled on increased risk of stroke due to Afib and benefits of anticoagulation for stroke prevention Reviewed importance of adherence to anticoagulant exactly as prescribed Counseled on bleeding risk associated with Coumadin and importance of self-monitoring for signs/symptoms of bleeding Counseled on importance of regular laboratory monitoring as prescribed Counseled on seeking medical attention after a head injury or if there is blood in the urine/stool  Communicated with cardiology about pt's concerns with amiodarone. Reviewed with pt to not stop medication unless advised by provider. Reinforced foods to avoid with Coumadin   CAD Interventions: (Status:  Goal on track:  Yes.) Long Term Goal Assessed understanding of CAD diagnosis Medications reviewed including  medications utilized in CAD treatment plan Provided education on importance of blood pressure control in management of CAD Provided education on Importance of limiting foods high in cholesterol Counseled on importance of regular laboratory monitoring as prescribed Reviewed Importance of taking all medications as prescribed Advised to report any changes in symptoms or exercise tolerance   Heart Failure Interventions:  (Status:  Goal on track:  Yes.) Long Term Goal Basic overview and discussion of pathophysiology of Heart Failure reviewed Provided education on low sodium diet Reviewed Heart Failure Action Plan in depth and provided written copy Discussed importance of daily weight and advised patient to weigh and record daily Reviewed role of diuretics in prevention of fluid overload and management of heart failure; Discussed the importance of keeping all appointments with provider Reinforced to follow  low sodium diet and fluid restrictions. Reinforced s/sx of HF to call provider.  Reinforced  to keep legs elevated when sitting  COPD Interventions:  (Status:  Goal on track:  Yes.) Long Term Goal Provided patient with basic written and verbal COPD education on self care/management/and exacerbation prevention Advised patient to track and manage COPD triggers Provided instruction about proper use of medications used for management of COPD including inhalers Advised patient to self assesses COPD action plan zone and make appointment with provider if in the yellow zone for 48 hours without improvement Provided education about and advised patient to utilize infection prevention strategies to reduce risk of respiratory infection Discussed the importance of adequate rest and management of fatigue with COPD Reviewed to keep appointment with Dr.Wert on 03/02/22 Reinforced to pace activity and to gradually increase as tolerated   Hyperlipidemia Interventions:  (Status:  Condition stable.  Not addressed  this visit.) Long Term Goal Medication review performed; medication list updated in electronic medical record.  Provider established cholesterol goals reviewed Counseled on importance of regular laboratory monitoring as prescribed Reviewed role and benefits of statin for ASCVD risk reduction Reviewed importance of limiting foods high in cholesterol  Hypertension Interventions:  (Status:  Goal on track:  Yes.) Long Term Goal Last practice recorded BP readings:  BP Readings from Last 3 Encounters:  01/29/22 112/64  01/22/22 116/60  01/18/22 120/78  Most recent eGFR/CrCl:  Lab Results  Component Value Date   EGFR 67 05/07/2021    No components found for: CRCL  Evaluation of current treatment plan related to hypertension self management and patient's adherence to plan as established by provider Provided education to patient re: stroke prevention, s/s of heart attack and stroke Reviewed medications with patient and discussed importance of compliance Discussed plans with patient for ongoing care management follow up and provided patient with direct contact information for care management team Advised patient, providing education and rationale, to monitor blood pressure daily and record, calling PCP for findings outside established parameters Provided education on prescribed diet low sodium heart healthy Reinforced to call provider if B/P is too low or high  Patient Goals/Self-Care Activities: Take all medications as prescribed Attend all scheduled provider appointments Call pharmacy for medication refills 3-7 days in advance of running out of medications Call provider office for new concerns or questions  call office if I gain more than 2 pounds in one day or 5 pounds in one week keep legs up while sitting track weight in diary watch for swelling in feet, ankles and legs every day weigh myself daily follow rescue plan if symptoms flare-up eat more whole grains, fruits and vegetables,  lean meats and healthy fats identify and remove indoor air pollutants limit outdoor activity during cold weather do breathing exercises every day follow rescue plan if symptoms flare-up get at least 7 to 8 hours of sleep at night use devices that will help like a cane, sock-puller or reacher do breathing exercises every day check pulse (heart) rate once a day make a plan to eat healthy keep all lab appointments check blood pressure 3 times per week take blood pressure log to all doctor appointments call doctor for signs and symptoms of high blood pressure take medications for blood pressure exactly as prescribed limit salt intake to less than 2076m/day call for medicine refill 2 or 3 days before it runs out take all medications exactly as prescribed call doctor with any symptoms you believe are related to your medicine  Follow Up Plan:  Telephone follow up appointment with care management team member scheduled for:  03/16/22 The patient has been provided with contact information for the care management team and has been advised to call with any health related questions or concerns.       Plan:Telephone follow up appointment with care management team member scheduled for:  03/16/22 The patient has been provided with contact information for the care management team and has been advised to call with any health related questions or concerns.  Peter Garter RN, Jackquline Denmark, CDE Care Management Coordinator [facility] [phone]

## 2022-02-16 NOTE — Patient Instructions (Signed)
Visit Information  Thank you for taking time to visit with me today. Please don't hesitate to contact me if I can be of assistance to you before our next scheduled telephone appointment.  Following are the goals we discussed today:  Take all medications as prescribed Attend all scheduled provider appointments Call pharmacy for medication refills 3-7 days in advance of running out of medications Call provider office for new concerns or questions  call office if I gain more than 2 pounds in one day or 5 pounds in one week keep legs up while sitting track weight in diary watch for swelling in feet, ankles and legs every day weigh myself daily follow rescue plan if symptoms flare-up eat more whole grains, fruits and vegetables, lean meats and healthy fats identify and remove indoor air pollutants limit outdoor activity during cold weather do breathing exercises every day follow rescue plan if symptoms flare-up get at least 7 to 8 hours of sleep at night use devices that will help like a cane, sock-puller or reacher do breathing exercises every day check pulse (heart) rate once a day make a plan to eat healthy keep all lab appointments check blood pressure 3 times per week take blood pressure log to all doctor appointments call doctor for signs and symptoms of high blood pressure take medications for blood pressure exactly as prescribed limit salt intake to less than '2000mg'$ /day call for medicine refill 2 or 3 days before it runs out take all medications exactly as prescribed call doctor with any symptoms you believe are related to your medicine  Our next appointment is by telephone on 03/16/22 at 1:15 PM  Please call the care guide team at [phone] if you need to cancel or reschedule your appointment.   If you are experiencing a Mental Health or [place] or need someone to talk to, please call the Suicide and Crisis Lifeline: 988 call the [place] National Suicide  Prevention Lifeline: (317)742-7114 or TTY: 548-845-9393 TTY 2123461801) to talk to a trained counselor call 1-800-273-TALK (toll free, 24 hour hotline) call 911   The patient verbalized understanding of instructions, educational materials, and care plan provided today and agreed to receive a mailed copy of patient instructions, educational materials, and care plan.  Peter Garter RN, Jackquline Denmark, CDE Care Management Coordinator [facility] [phone]

## 2022-02-16 NOTE — Telephone Encounter (Signed)
RN returned call to patient and provided the following recommendations to the patient. Patient verbalizes understanding.     "Amiodarone started in clinic visit due to uncontrolled atrial fibrillation causing exacerbations of heart failure.    Given reported side effects, may discontinue Amiodarone. Follow up as scheduled with Dr. Oval Linsey 03/04/22.    Loel Dubonnet, NP "

## 2022-02-16 NOTE — Telephone Encounter (Signed)
  Pt c/o medication issue:  1. Name of Medication:   amiodarone (PACERONE) 200 MG tablet    2. How are you currently taking this medication (dosage and times per day)? Take 1 tablet (200 mg total) by mouth 2 (two) times daily for 14 days, THEN 1 tablet (200 mg total) daily.  3. Are you having a reaction (difficulty breathing--STAT)?   4. What is your medication issue? Pt said, she is having all the side effects of this medication, including she cant sleep at night

## 2022-02-16 NOTE — Telephone Encounter (Signed)
Rn returning call to patient to discuss issues with medication. Patient states that [name], NP started her on Amiodarone '200mg'$  tablet for congestive heart failure. Instructions were to take 2 a day for 2 weeks and then once daily. Patient states she has the paper from the drug store with the side effects and she has most of them. Cant sleep despite taking a valium, tired, eyes are going crossed, shaking, nervous on the outside and the inside. Patient also has increased saliva, she said that is runs out of the corner of her mouth.    Routing to [name], NP for advisement.

## 2022-02-16 NOTE — Telephone Encounter (Signed)
Amiodarone started in clinic visit due to uncontrolled atrial fibrillation causing exacerbations of heart failure.   Given reported side effects, may discontinue Amiodarone. Follow up as scheduled with Dr. Oval Linsey 03/04/22.   Loel Dubonnet, NP

## 2022-02-24 ENCOUNTER — Telehealth: Payer: Self-pay

## 2022-02-24 ENCOUNTER — Ambulatory Visit (INDEPENDENT_AMBULATORY_CARE_PROVIDER_SITE_OTHER): Payer: Medicare Other

## 2022-02-24 ENCOUNTER — Ambulatory Visit (INDEPENDENT_AMBULATORY_CARE_PROVIDER_SITE_OTHER): Payer: Medicare Other | Admitting: Family Medicine

## 2022-02-24 ENCOUNTER — Encounter: Payer: Self-pay | Admitting: Family Medicine

## 2022-02-24 VITALS — BP 110/70 | HR 53 | Temp 98.3°F | Ht 63.0 in | Wt 144.2 lb

## 2022-02-24 DIAGNOSIS — M545 Low back pain, unspecified: Secondary | ICD-10-CM

## 2022-02-24 DIAGNOSIS — J449 Chronic obstructive pulmonary disease, unspecified: Secondary | ICD-10-CM

## 2022-02-24 DIAGNOSIS — I509 Heart failure, unspecified: Secondary | ICD-10-CM | POA: Diagnosis not present

## 2022-02-24 DIAGNOSIS — I2510 Atherosclerotic heart disease of native coronary artery without angina pectoris: Secondary | ICD-10-CM

## 2022-02-24 DIAGNOSIS — I110 Hypertensive heart disease with heart failure: Secondary | ICD-10-CM | POA: Diagnosis not present

## 2022-02-24 DIAGNOSIS — E785 Hyperlipidemia, unspecified: Secondary | ICD-10-CM

## 2022-02-24 DIAGNOSIS — I4891 Unspecified atrial fibrillation: Secondary | ICD-10-CM

## 2022-02-24 DIAGNOSIS — D6832 Hemorrhagic disorder due to extrinsic circulating anticoagulants: Secondary | ICD-10-CM | POA: Diagnosis not present

## 2022-02-24 DIAGNOSIS — Z87891 Personal history of nicotine dependence: Secondary | ICD-10-CM | POA: Diagnosis not present

## 2022-02-24 DIAGNOSIS — T45515A Adverse effect of anticoagulants, initial encounter: Secondary | ICD-10-CM | POA: Diagnosis not present

## 2022-02-24 DIAGNOSIS — J441 Chronic obstructive pulmonary disease with (acute) exacerbation: Secondary | ICD-10-CM

## 2022-02-24 LAB — POCT INR: INR: 5.3 — AB (ref 2.0–3.0)

## 2022-02-24 MED ORDER — TRAMADOL HCL 50 MG PO TABS: 50.0000 mg | ORAL_TABLET | Freq: Four times a day (QID) | ORAL | 0 refills | Status: AC | PRN

## 2022-02-24 NOTE — Patient Instructions (Addendum)
Pre visit review using our clinic review tool, if applicable. No additional management support is needed unless otherwise documented below in the visit note.  Hold dose today and hold dose tomorrow and then change weekly dose to take 1/2 tablet daily except take 1 tablet on Mondays, Wednesdays and Fridays. Recheck in 1 week at cardiology appointment on 6/8

## 2022-02-24 NOTE — Progress Notes (Signed)
Established Patient Office Visit  Subjective   Patient ID: Catherine [name], female    DOB: [birth year]  Age: 87 y.o. MRN: [id number]  Chief Complaint  Patient presents with   Back Injury    Patient complains of back injury, x1 week, Patient reports she was reaching upwards to grab am object and felt a pop,Tried Tylenol with little relief    HPI   Seen with somewhat poorly localized lower back pain.  Onset about 10 days ago.  She states she was cleaning some windows and twisted trying to push up on a window and felt a "pop "sensation in her lower back.  She has had some pain since then initially more left lumbar but now bilateral.  She has taken some Tylenol which helps.  She has severe pain at times.  Not resting well.  Had difficulty getting into their bed and had to sleep on the sofa for the past 10 days.  No consistent radiculitis symptoms.  She had low back surgery previously.  Denies any lower extremity numbness.  Past Medical History:  Diagnosis Date   Acute on chronic diastolic heart failure (HCC)    Aortic stenosis, mild 07/26/2017   Arthritis    Asthma    last attack 02/2015   Atrial fibrillation, chronic (HCC)    Cellulitis of left lower extremity 08/17/2016   Ulcer associated with severe venous insufficiency   Chronic anticoagulation    Chronic diastolic CHF (congestive heart failure) ([place])    Chronic kidney disease    "RIGHT MANY KIDNEY INFECTIONS AND STONES"   COPD (chronic obstructive pulmonary disease) (HCC)    Coronary artery disease    Dizziness    H/O: rheumatic fever    Heart murmur    Hypertension    PONV (postoperative nausea and vomiting)    ' SOMETIMES', BUT NOT ALWAYS"   S/P Maze operation for atrial fibrillation 10/14/2016   Complete bilateral atrial lesion set using cryothermy and bipolar radiofrequency ablation - atrial appendage was not treated due to previous surgical procedure (open mitral commissurotomy)   S/P mitral valve replacement with  bioprosthetic valve 10/14/2016   29 mm Medtronic Mosaic porcine bioprosthetic tissue valve   Tricuspid regurgitation 07/14/2021   UTI (urinary tract infection)    Valvular heart disease    Has mitral stenosis with prior mitral commissurotomy in 1970   Past Surgical History:  Procedure Laterality Date   ABDOMINAL HYSTERECTOMY  1983   endometriosis   APPENDECTOMY     BACK SURGERY     neurosurgery x2   CARDIAC CATHETERIZATION     CARDIAC CATHETERIZATION N/A 08/03/2016   Procedure: Right/Left Heart Cath and Coronary Angiography;  Surgeon: Peter M Martinique, MD;  Location: [facility];  Service: Cardiovascular;  Laterality: N/A;   cataract surg     CHOLECYSTECTOMY N/A 05/30/2015   Procedure: LAPAROSCOPIC CHOLECYSTECTOMY WITH INTRAOPERATIVE CHOLANGIOGRAM;  Surgeon: Excell Seltzer, MD;  Location: [facility];  Service: General;  Laterality: N/A;   COLONOSCOPY     CORONARY ARTERY BYPASS GRAFT N/A 10/14/2016   Procedure: CORONARY ARTERY BYPASS GRAFTING (CABG);  Surgeon: Rexene Alberts, MD;  Location: [facility];  Service: Open Heart Surgery;  Laterality: N/A;   EYE SURGERY     MAZE N/A 10/14/2016   Procedure: MAZE;  Surgeon: Rexene Alberts, MD;  Location: [facility];  Service: Open Heart Surgery;  Laterality: N/A;   MITRAL VALVE REPLACEMENT N/A 10/14/2016   Procedure: REDO MITRAL VALVE REPLACEMENT (MVR);  Surgeon:  Rexene Alberts, MD;  Location: [facility];  Service: Open Heart Surgery;  Laterality: N/A;   MITRAL VALVE SURGERY Left 1970   Open mitral commissurotomy via left thoracotomy approach   TEE WITHOUT CARDIOVERSION N/A 07/05/2016   Procedure: TRANSESOPHAGEAL ECHOCARDIOGRAM (TEE);  Surgeon: [name], MD;  Location: [facility];  Service: Cardiovascular;  Laterality: N/A;   TEE WITHOUT CARDIOVERSION N/A 10/14/2016   Procedure: TRANSESOPHAGEAL ECHOCARDIOGRAM (TEE);  Surgeon: Rexene Alberts, MD;  Location: [facility];  Service: Open Heart Surgery;  Laterality: N/A;    reports that she quit smoking about  46 years ago. Her smoking use included cigarettes. She has a 15.00 pack-year smoking history. She has never used smokeless tobacco. She reports that she does not drink alcohol and does not use drugs. family history includes Leukemia in her father. Allergies  Allergen Reactions   Aldactone [Spironolactone] Other (See Comments)    dyspnea   Amoxicillin Palpitations    Tachycardia Has patient had a PCN reaction causing immediate rash, facial/tongue/throat swelling, SOB or lightheadedness with hypotension: no Has patient had a PCN reaction causing severe rash involving mucus membranes or skin necrosis: {no Has patient had a PCN reaction that required hospitalization {no Has patient had a PCN reaction occurring within the last 10 years: {yes If all of the above answers are "NO", then may proceed with Cephalosporin use.   Diltiazem Other (See Comments)    Causing headaches    Flagyl [Metronidazole Hcl] Other (See Comments)    Causing headaches     Flovent [Fluticasone Propionate] Other (See Comments)    Leg cramps   Gabapentin Swelling   Lyrica [Pregabalin] Swelling   Quinidine Diarrhea and Other (See Comments)    Fever diarrhea   Simvastatin Other (See Comments)    Leg pain, myalgia   Tramadol Nausea Only   Verapamil Other (See Comments)    myalgias   Amlodipine     Low extremity edema   Crestor [Rosuvastatin]     myalgia   Livalo [Pitavastatin]     myalgias   Zetia [Ezetimibe]     LEG CRAMPS    Benazepril Hcl Cough   Ciprofloxacin Diarrhea   Codeine Nausea Only   Nitrofurantoin Monohyd Macro Nausea Only    Review of Systems  Constitutional:  Negative for chills and fever.  Cardiovascular:  Negative for chest pain.  Genitourinary:  Negative for dysuria.  Musculoskeletal:  Positive for back pain.  Neurological:  Negative for focal weakness.     Objective:     BP 110/70 (BP Location: Left Arm, Patient Position: Sitting, Cuff Size: Normal)   Pulse (!) 53   Temp 98.3  F (36.8 C) (Oral)   Ht '5\' 3"'$  (1.6 m)   Wt 144 lb 3.2 oz (65.4 kg)   SpO2 98%   BMI 25.54 kg/m    Physical Exam Vitals reviewed.  Constitutional:      Appearance: Normal appearance.  Cardiovascular:     Rate and Rhythm: Normal rate and regular rhythm.  Pulmonary:     Effort: Pulmonary effort is normal.     Breath sounds: Normal breath sounds.  Musculoskeletal:     Comments: Lumbar spine reveals no localizing tenderness.  Neurological:     Mental Status: She is alert.     Comments: Full strength lower extremities     No results found for any visits on 02/24/22.    The ASCVD Risk score (Arnett DK, et al., 2019) failed to calculate for the following reasons:  The 2019 ASCVD risk score is only valid for ages 21 to 55    Assessment & Plan:   Problem List Items Addressed This Visit   None Visit Diagnoses     Lumbar pain    -  Primary   Relevant Medications   traMADol (ULTRAM) 50 MG tablet   Other Relevant Orders   DG Lumbar Spine Complete     Acute lumbar back pain.  Following injury at home as above.  Hopefully represents just soft tissue strain.  Avoid nonsteroidals and muscle relaxers because of risk.  She is on chronic Coumadin.  Continue Tylenol as needed.  We did write for limited tramadol 50 mg every 6 hours as needed with caution for potential side effects.  Plain x-rays lumbaosacral spine  No follow-ups on file.    [name], MD

## 2022-02-24 NOTE — Progress Notes (Signed)
Pt was advised by cardiology to stop amiodarone last week due to side effects. Pt has also been experiencing more edema. Pt was in to see PCP today regarding back pain from injury and had x-ray. Pt was advised, per PCP, no acute findings on x-ray today but radiology will over-read and contact if needed.  Pt wants to reduce apts and needs INR checked next week. She has apt with cardiology on 6/8 and she will request her INR be checked at that apt. Advised a msg would also be sent to cardiology to request INR.   Hold dose today and hold dose tomorrow and then change weekly dose to take /12 tablet daily except take 1 tablet on Mondays, Wednesdays and Fridays. Recheck in 1 weeks at cardiology office.

## 2022-02-24 NOTE — Telephone Encounter (Signed)
Pt was in today for coumadin clinic and result was 5.3  Pt needs INR checked in one week but is trying to reduce apts so requested that INR be checked at her cardiology apt on 6/8. Advised pt a msg would be sent to her cardiologist request POCT INR or lab draw INR. Advised pt to assure to have this completed at cardiology. Advised this nurse will contact her with dosing instructions after result is returned.

## 2022-02-25 ENCOUNTER — Telehealth: Payer: Self-pay | Admitting: *Deleted

## 2022-02-25 NOTE — Telephone Encounter (Signed)
Malachy Mood called from [facility] with a call report stating the lumbar spine x-ray performed today reveals:  compression fracture deformity L1 vertebral body, likely acute and MRI was recommended.  Message sent to Montez Hageman as PCP is out of the office.

## 2022-02-26 NOTE — Telephone Encounter (Signed)
Noted, pt will have INR checked at cardiology on 6/8

## 2022-02-26 NOTE — Telephone Encounter (Signed)
Pt informed of the message and verbalized understanding. F/u scheduled

## 2022-03-02 ENCOUNTER — Encounter: Payer: Self-pay | Admitting: Internal Medicine

## 2022-03-02 ENCOUNTER — Ambulatory Visit: Payer: Medicare Other | Admitting: Internal Medicine

## 2022-03-02 DIAGNOSIS — J449 Chronic obstructive pulmonary disease, unspecified: Secondary | ICD-10-CM | POA: Diagnosis not present

## 2022-03-02 NOTE — Progress Notes (Signed)
Subjective:     Patient ID: Catherine [name], female   DOB: [birth year]    MRN: [id number]    Brief patient profile:  87   yowf quit smoking in 1977 with GOLD II criteria 6/29016      07/29/2016  Pulmonary consultation /Asuka Dusseau re: unexplained sob in pt with Mitral stenosis/ GOLD II criteria copd  Chief Complaint  Patient presents with   Pulmonary Consult    Referred by Dr. Oval Linsey. Pt states that she is going to have mitral valve replacement soon. She c/o increased SOB since her last visit in 2016. She gets SOB doing housework.     progressive doe x year not able to trigger hfa effectively (at least a one second delay)  Previously able to do half mile on a treadmill  X  3.5 mph and flat now  2.2 x 0.2 of a mile then stops due to sob occ orthopnea/ maybe once a month Always recovers within a few minutes   rec Plan A = Automatic = Symbicort 80 Take 2 puffs first thing in am and then another 2 puffs about 12 hours later.  Work on inhaler technique:  - late add  If really needs hfa may be required to use spacer as could never trigger the mdi at onset of insp, only > 1 sec prior Plan B = Backup Only use your albuterol as rescue    Oct 14 2016  MVR [name]> marked improvement in doe         Admit date: 01/05/2022 Discharge date: 01/12/2022   Admitted From: home Disposition:  home   Recommendations for Outpatient Follow-up:  Follow up with PCP in 1-2 weeks Please obtain BMP/CBC in one week   Home Health: none  Equipment/Devices: home O2   Discharge Condition: stable CODE STATUS: Full code Diet recommendation: low sodium   HPI: Per admitting MD, Catherine [name] is a 87 y.o. female with medical history significant of chronic diastolic CHF, moderate tricuspid regurg, CAD s/p CABG, rheumatic heart disease, chronic atrial fibrillation on Coumadin, and CKD stage III presents with complaints of progressively worsening shortness of breath over the last 4 days.  She complains of having a  productive cough with beige to brownish sputum production.  Been taking Mucinex without improvement.  Associated symptoms include wheezing, leg swelling, weight from 147 pounds to 152 pounds.  Denies having any significant fever.  She has been abiding by low-sodium diet and taking furosemide 40 mg twice daily.  Last hospitalized 2/2-2/7 for acute respiratory failure with hypoxia related to a combination of COPD and diastolic heart failure. On admission to the emergency department patient was noted to be afebrile with respirations 15-23, blood pressures maintained, and O2 saturations as low as 87-88% on room air with improvement on 3 L of nasal cannula oxygen.  Labs significant for platelets 128, potassium 3.3, BNP 184.1, INR 3, and high-sensitivity troponins negative x2.  Chest x-ray noted cardiomegaly with small left effusion and mild diffuse interstitial opacity concerning for edema or inflammatory process.  Patient was suspected to be fluid overloaded and given Lasix 60 mg IV, Valium 2.5 mg IV, and DuoNeb breathing treatments.   Hospital Course / Discharge diagnoses: Principal Problem:   Acute respiratory failure with hypoxia  secondary to acute on chronic COPD and diastolic congestive heart failure exacerbation Active Problems:   Atrial fibrillation (HCC)   Hypertension   Chronic kidney disease   Coronary artery disease   Anxiety     Assessment  and Plan: Principal problem Acute respiratory failure with hypoxia  secondary to acute on chronic COPD and diastolic congestive heart failure exacerbation -cardiology consulted, appreciate input.  She was placed on IV furosemide, with significant improvement in her volume status and currently appears dry/euvolemic.  She was able to be weaned off to room air, however on ambulation she was found to require oxygen which I suspect is due to her underlying COPD.  She was maintained on steroids with improvement in her wheezing.  She was converted to p.o.  diuretics per cardiology, will also do a steroid taper.  She is significantly improved, able to ambulate in the hallway, feeling better, and will be discharged home in stable condition with outpatient follow-up.  She has been reporting some dyspnea for several months, likely multifactorial due to deconditioning, COPD, diastolic CHF, A-fib.    Active problems Atrial fibrillation, intermittent RVR-Continue heart rate control with metoprolol and anticoagulation with Coumadin.  Currently on metoprolol 35.7 twice daily.  Rates overall stable, will prescribe on discharge  Hypertension -Continue metoprolol as below, on furosemide.  She was added on losartan  CKD 3a -Cr stable with diuresis Coronary artery disease -on aspirin, statin, no chest pain or ACS type features Dyslipidemia- continue with rosuvastatin.   Anxiety -Continue home regimen   Sepsis ruled out  03/02/2022  f/u ov/Darcell Sabino re: GOLD 2  copd  maint on symb 80 2bid   Chief Complaint  Patient presents with   Follow-up    Breathing is overall doing well. She c/o occ prod cough with clear sputum.   Dyspnea:  walks [place] both [place]" x sev hundred feet same walk  as usual but now slower / more tired  on high dose lopressor to control AF Cough: same as usual/ sporadic mucoid  Sleeping: bed blocks and pillows  SABA use: neb daily ? Not sure it does much  02: not using using / just prn and not titrating    No obvious day to day or daytime variability or assoc excess/ purulent sputum or mucus plugs or hemoptysis or cp or chest tightness, subjective wheeze or overt sinus or hb symptoms.   Sleeping  without nocturnal  or early am exacerbation  of respiratory  c/o's or need for noct saba. Also denies any obvious fluctuation of symptoms with weather or environmental changes or other aggravating or alleviating factors except as outlined above   No unusual exposure hx or h/o childhood pna/ asthma or knowledge of premature birth.  Current  Allergies, Complete Past Medical History, Past Surgical History, Family History, and Social History were reviewed in [employer] record.  ROS  The following are not active complaints unless bolded Hoarseness, sore throat, dysphagia, dental problems, itching, sneezing,  nasal congestion or discharge of excess mucus or purulent secretions, ear ache,   fever, chills, sweats, unintended wt loss or wt gain, classically pleuritic or exertional cp,  orthopnea pnd or arm/hand swelling  or leg swelling, presyncope, palpitations, abdominal pain, anorexia, nausea, vomiting, diarrhea  or change in bowel habits or change in bladder habits, change in stools or change in urine, dysuria, hematuria,  rash, arthralgias, visual complaints, headache, numbness, weakness or ataxia or problems with walking or coordination,  change in mood or  memory.        Current Meds  Medication Sig   acetaminophen (TYLENOL) 500 MG tablet Take 1,000 mg by mouth every 6 (six) hours as needed for mild pain.   albuterol (PROVENTIL) (2.5 MG/3ML)  0.083% nebulizer solution Take 3 mLs (2.5 mg total) by nebulization every 4 (four) hours as needed for wheezing or shortness of breath.   albuterol (VENTOLIN HFA) 108 (90 Base) MCG/ACT inhaler INHALE 2 PUFFS INTO THE LUNGS EVERY 4 HOURS AS NEEDED FOR AHEEZING OR SHORTNESS OF BREATH   benzonatate (TESSALON PERLES) 100 MG capsule Take 1 capsule (100 mg total) by mouth every 6 (six) hours as needed for cough.   cholecalciferol (VITAMIN D3) 25 MCG (1000 UNIT) tablet Take 1,000 Units by mouth daily.   diazepam (VALIUM) 5 MG tablet TAKE ONE TABLET AT BEDTIME   doxazosin (CARDURA) 8 MG tablet TAKE 1 TABLET DAILY   losartan (COZAAR) 25 MG tablet Take 1 tablet (25 mg total) by mouth daily.   metoprolol succinate (TOPROL-XL) 25 MG 24 hr tablet Take 1 tablet (25 mg total) by mouth daily.   Multiple Vitamins-Minerals (HAIR/SKIN/NAILS/BIOTIN PO) Take 1 tablet by mouth daily.   Multiple  Vitamins-Minerals (MULTIVITAMIN ADULT EXTRA C) CHEW Chew 2 tablets by mouth daily.   potassium chloride (KLOR-CON) 10 MEQ tablet Take 4 tablets (40 mEq total) by mouth 2 (two) times daily.   rosuvastatin (CRESTOR) 5 MG tablet Take one tablet every Monday, wednesday Friday and 'Sunday   SYMBICORT 80-4.5 MCG/ACT inhaler INHALE 2 PUFFS TWICE DAILY   torsemide (DEMADEX) 20 MG tablet Take 2 tablets (40 mg total) by mouth in the morning AND 1 tablet (20 mg total) every evening. Can take extra 1 tab (20mg) as needed for weight gain..   warfarin (COUMADIN) 2 MG tablet TAKE ONE TABLET DAILY BY MOUTH EXCEPT TAKE 1/2 TABLET ON WEDNESDAYS AND SATURDAYS OR AS DIRECTED BY ANTICOAGULATION CLINIC                Objective:   Physical Exam    wts   03/02/2022          144   04/01/2021          153 07/09/2020      152  01/08/2020        145  12/20/2017        149   07/29/2016       15'$ 1  03/24/15 139 lb 12.8 oz (63.413 kg)  03/20/15 141 lb (63.957 kg)  03/16/15 143 lb (64.864 kg)    Vital signs reviewed  03/02/2022  - Note at rest 02 sats  95% on RA   General appearance:    amb wf  with voice fatigue / easily confused with details of care.    HEENT : Oropharynx  clear   Nasal turbinates nl    NECK :  without  apparent JVD/ palpable Nodes/TM    LUNGS: no acc muscle use,  Min barrel  contour chest wall with bilateral  slightly decreased bs s audible wheeze and  without cough on insp or exp maneuvers and min  Hyperresonant  to  percussion bilaterally    CV:  IRIR  no s3 or murmur or increase in P2, and no edema   ABD:  soft and nontender with pos end  insp Hoover's  in the supine position.  No bruits or organomegaly appreciated   MS:  Nl gait/ ext warm without deformities Or obvious joint restrictions  calf tenderness, cyanosis or clubbing     SKIN: warm and dry without lesions    NEURO:  alert, approp, nl sensorium with  no motor or cerebellar deficits apparent.         I personally reviewed  images and agree with radiology impression as follows:  CXR:   portable  01/12/22  No acute cardiopulmonary process.          Assessment:

## 2022-03-02 NOTE — Assessment & Plan Note (Addendum)
Quit smoking 1977 - Spirometry 03/24/15  FEV1  1.41 (70%) ratio 65  - 03/26/2015  Walked RA x 3 laps @ 185 ft each stopped due to  End of study, brisk pace, no sob or desat  - Spirometry 07/29/2016  FEV1 1.30 (66%)  Ratio 66   - Spirometry 12/20/2017  FEV1 1.34 (69%)  Ratio 71 with min curvature p am symb 80 and saba w/in 1 hour - 12/20/2017  Walked RA x 3 laps @ 185 ft each stopped due to  End of study, moderately fast pace, no sob or desat       - 03/02/2022  After extensive coaching inhaler device,  effectiveness =    75%  >>>  continue symbiocort 80 2bid plus approp saba   - 03/02/2022   Walked on RA  x 2  lap(s) =  approx 500  ft  @ mod  pace, stopped due to tired, not sob  with lowest 02 sats 93% and HR  61  Appears to have chronotropic incompetence due to BB rx of AFib so more tired than sob at this point and no need to change rx for copd   = symb 80 2bid which will irritate the upper way less than the alternatives  Each maintenance medication was reviewed in detail including emphasizing most importantly the difference between maintenance and prns and under what circumstances the prns are to be triggered using an action plan format where appropriate.  Total time for H and P, chart review, counseling, reviewing hfa device(s) , directly observing portions of ambulatory 02 saturation study/ and generating customized AVS unique to this office visit / same day charting > 30 min for  refractory exertional   symptoms of uncertain etiology

## 2022-03-02 NOTE — Patient Instructions (Addendum)
Plan A = Automatic = Always=    symbicort 80 Take 2 puffs first thing in am and then another 2 puffs about 12 hours later.    Work on inhaler technique:  relax and gently blow all the way out then take a nice smooth full deep breath back in, triggering the inhaler at same time you start breathing in.  Hold for up to 5 seconds if you can. Blow out thru nose. Rinse and gargle with water when done.  If mouth or throat bother you at all,  try brushing teeth/gums/tongue with arm and hammer toothpaste/ make a slurry and gargle and spit out.   Plan B = Backup (to supplement plan A, not to replace it) Only use your albuterol inhaler as a rescue medication to be used if you can't catch your breath by resting or doing a relaxed purse lip breathing pattern.  - The less you use it, the better it will work when you need it. - Ok to use the inhaler up to 2 puffs  every 4 hours if you must but call for appointment if use goes up over your usual need - Don't leave home without it !!  (think of it like the spare tire for your car)   Plan C = Crisis (instead of Plan B but only if Plan B stops working) - only use your albuterol nebulizer if you first try Plan B and it fails to help > ok to use the nebulizer up to every 4 hours but if start needing it regularly call for immediate appointment  Ok to try albuterol 15 min before an activity (on alternating days with inhaler vs nebulizer)  that you know would usually make you short of breath and see if it makes any difference and if makes none then don't take albuterol after activity unless you can't catch your breath as this means it's the resting that helps, not the albuterol.       Make sure you check your oxygen saturation  AT  your highest level of activity (not after you stop)   to be sure it stays over 90% and adjust  02 flow upward to maintain this level if needed but remember to turn it back to previous settings when you stop (to conserve your supply).     Please schedule a follow up visit in 3 months but call sooner if needed

## 2022-03-04 ENCOUNTER — Telehealth: Payer: Self-pay

## 2022-03-04 ENCOUNTER — Telehealth (HOSPITAL_BASED_OUTPATIENT_CLINIC_OR_DEPARTMENT_OTHER): Payer: Self-pay | Admitting: *Deleted

## 2022-03-04 ENCOUNTER — Ambulatory Visit (HOSPITAL_BASED_OUTPATIENT_CLINIC_OR_DEPARTMENT_OTHER): Payer: Medicare Other | Admitting: Cardiovascular Disease

## 2022-03-04 VITALS — BP 158/68 | HR 47 | Ht 63.0 in | Wt 146.2 lb

## 2022-03-04 DIAGNOSIS — Z7901 Long term (current) use of anticoagulants: Secondary | ICD-10-CM

## 2022-03-04 DIAGNOSIS — Z5181 Encounter for therapeutic drug level monitoring: Secondary | ICD-10-CM

## 2022-03-04 DIAGNOSIS — I4811 Longstanding persistent atrial fibrillation: Secondary | ICD-10-CM

## 2022-03-04 LAB — POCT INR: INR: 1.9 — AB (ref 2.0–3.0)

## 2022-03-04 MED ORDER — ENTRESTO 24-26 MG PO TABS: 1.0000 | ORAL_TABLET | Freq: Two times a day (BID) | ORAL | 5 refills | Status: DC

## 2022-03-04 MED ORDER — BUMETANIDE 2 MG PO TABS: 2.0000 mg | ORAL_TABLET | Freq: Two times a day (BID) | ORAL | 3 refills | Status: DC

## 2022-03-04 NOTE — Telephone Encounter (Signed)
Pt had cardiology apt today and preferred having INR lab done there to save her a trip. INR result is not back yet. Pt was advised to take 1/2 tablet warfarin today and this nurse will f/u with her tomorrow after the result is back. Pt also reported the medication she was prescribed today was expensive. Advised pt of [place] and we will discuss that more the next time she is in coumadin clinic.

## 2022-03-04 NOTE — Patient Instructions (Addendum)
Medication Instructions:  STOP LOSARTAN   STOP TORSEMIDE   START ENTRESTO 24-26 MG TWICE A DAY   START BUMEX 2 MG TWICE A DAY   *If you need a refill on your cardiac medications before your next appointment, please call your pharmacy*  Lab Work: BMET IN 1 WEEK   INR TODAY   If you have labs (blood work) drawn today and your tests are completely normal, you will receive your results only by: MyChart Message (if you have MyChart) OR A paper copy in the mail If you have any lab test that is abnormal or we need to change your treatment, we will call you to review the results.  Testing/Procedures: NONE  Follow-Up: At [facility], you and your health needs are our priority.  As part of our continuing mission to provide you with exceptional heart care, we have created designated Provider Care Teams.  These Care Teams include your primary Cardiologist (physician) and Advanced Practice Providers (APPs -  Physician Assistants and Nurse Practitioners) who all work together to provide you with the care you need, when you need it.  We recommend signing up for the patient portal called "MyChart".  Sign up information is provided on this After Visit Summary.  MyChart is used to connect with patients for Virtual Visits (Telemedicine).  Patients are able to view lab/test results, encounter notes, upcoming appointments, etc.  Non-urgent messages can be sent to your provider as well.   To learn more about what you can do with MyChart, go to [email or web address].    Your next appointment:   2 week(s)  The format for your next appointment:   In Person  Provider:   [name], NP   DR [facility] 3 MONTHS

## 2022-03-04 NOTE — Telephone Encounter (Signed)
Entresto 24-25 mg # 2 [id number] exp 4/25 Given to patient at visit

## 2022-03-04 NOTE — Progress Notes (Signed)
Cardiology Office Note  Date:  03/31/2022   ID:  [name], DOB [birth year], MRN [id number]  PCP:  [name], MD  Cardiologist:   [name], MD  Cardiothoracic surgeon: Dr. Roxy Manns  No chief complaint on file.  History of Present Illness: Catherine [name] is a 87 y.o. 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 [name] was previously a patient of [name]. She underwent mitral commissurotomy in the 1970s. On her echocardiogram 02/2014 she had an ejection fraction of 60-65% with moderate to severe mitral regurgitation. There was also mild aortic regurgitation. Her follow-up echo 02/2015 showed an EF of 60-65% with moderate to severe mitral regurgitation, moderate tricuspid regurgitation and elevated pulmonary artery pressures.  Repeat echo 9/11/7 revealed moderate pulmonary stenosis with moderate to severe mitral regurgitation.  She did not have any pulmonary hypertension and her LV was not dilated.  She was referred for left and right heart catheterization. She was found to have severe mitral stenosis with a 13 mmHg mean gradient, MVA 0.99 cm^2. She also had 50% LCx and 80% RCA lesions.  She had a 29 mm bioprosthetic mitral valve, single vessel CABG and MAZE with Dr. Roxy Manns on 10/14/16.  She was referred for a baseline echo 02/15/17 that revealed LVEF 60-65% with mild aortic stenosis (mean gradient 12 mmHg).  Her bioprosthetic mitral valve was functioning well.    After her surgery [name] continued to report fatigue.  It was unclear whether she was in atrial fibrillation or sinus rhythm with a long PR interval.  She was referred to the atrial fibrillation clinic where this remained unclear.  However, it was felt that if she were in atrial fibrillation it would not be possible to maintain sinus rhythm.  She  developed lower extremity edema. She stopped amlodipine and this improved. This was switched to irbesartan. She then developed diaphoresis and insomnia that she attributed to this medication. She was switched from lasix to torsemide which helped her abdominal bloating but she still had LE edema. She has followed up with [name] NP and noted increased LE edema and hip pain. She also reported dizziness and had a repeat echo 06/2021 with LV 60-65%. Her bioprosthetic mitral valve was functioning properly. She had moderate to severe tricuspid regurgitation and PASP was 67mHg.   At her last appointment, she reported some shortness of breath that improved with torsemide. Her at home blood pressure was ranging 110s-150s/50s-60s, averaging in the 120s-130s. On 10/25/2021 she presented to the ED with complaints of fatigue, cough, nausea, fever, congestion, and headache. Her symptoms were thought to have a viral etiology. She was later admitted to the hospital 10/29/2021 and was found to be hypoxic to 84% with activity, BNP 234, and a chest CT revealed right middle lobe atelectasis and small pleural effusions. 2D echo noted preserved EF. She had moderate TR and her bioprosthetic mitral valve was stable. She also reported right lateral chest wall pain with upper back pain. It was suspected she had a COPD exacerbation with mild component of diastolic CHF.   She is accompanied by her husband. Today, she states she is not feeling well. She is having difficulty with coughing up sputum. Sometimes her cough is productive, but most of the time she feels like the sputum is stuck in her chest. She feels like she still has an infection  in her chest, and she endorses severe dyspnea and night sweats. Yesterday she noticed that her left foot was suddenly swelling. Having L>R edema is typical for her, which she attributes to a prior injury. She also notes her LLE is very sore to palpation. She presents a weight log today, which  shows some weight loss. Her husband notes she has not had as much of an appetite, but this is gradually improving. She quit smoking in 1977 after 15 years of smoking. Her husband reports that she also worked as a Theme park manager for years and was exposed to sprays, bleach, and other chemicals. She continues to use Symbicort and ProAir inhalers, which do help her. Of note, she had a previous COVID infection in 2020. Since then she has never felt that she returned to baseline. She denies any palpitations, lightheadedness, headaches, syncope, orthopnea, or PND.  At the last appointment she continued to have dyspnea after hospitalization for COPD.  Though she did have mild tenderness concerning that potentially she had underlying pulmonary process as well.  She was referred to pulmonary and her torsemide was increased for a few days.  She saw Dr. [name] and was started on Symbicort with albuterol as needed.  She was hospitalized 12/2020 with acute on chronic diastolic heart failure and COPD.  She was diuresed with IV Lasix and started on low-dose losartan.  In the hospital she was noted to have episodes of A-fib with bradycardia.  Her heart rates dropped into the 40s and she had up to 2.4-second pauses on metoprolol.  Her metoprolol dose was reduced.  She followed up in advanced heart failure clinic 12/2021 and was feeling much better.  She saw [name], NP 01/2022 and her dyspnea was improving.  She was started on amiodarone. It made her feel poorly, she had her hair was falling out, and it affected her coumadin levels.  Her INR was 5.3 last week.  She stopped the amiodarone.  She hasn't noted any palpitations.  She has occasional LH.  No falls.   She did PT after she left the hospital but still feels wobbly.  3 days after seeing Urban Gibson she started noticing lower extremity edema.  She has been trying to avoid salt.  At home her blood pressures have been well controlled.  She denies orthopnea or PND but does have  shortness of breath.  She does not seem to get good urine output after taking torsemide.  Past Medical History:  Diagnosis Date   Acute on chronic diastolic heart failure (HCC)    Aortic stenosis, mild 07/26/2017   Arthritis    Asthma    last attack 02/2015   Atrial fibrillation, chronic (HCC)    Cellulitis of left lower extremity 08/17/2016   Ulcer associated with severe venous insufficiency   Chronic anticoagulation    Chronic diastolic CHF (congestive heart failure) ([place])    Chronic kidney disease    "RIGHT MANY KIDNEY INFECTIONS AND STONES"   COPD (chronic obstructive pulmonary disease) (HCC)    Coronary artery disease    Dizziness    H/O: rheumatic fever    Heart murmur    Hypertension    PONV (postoperative nausea and vomiting)    ' SOMETIMES', BUT NOT ALWAYS"   S/P Maze operation for atrial fibrillation 10/14/2016   Complete bilateral atrial lesion set using cryothermy and bipolar radiofrequency ablation - atrial appendage was not treated due to previous surgical procedure (open mitral commissurotomy)   S/P mitral valve replacement with bioprosthetic valve  10/14/2016   29 mm Medtronic Mosaic porcine bioprosthetic tissue valve   Tricuspid regurgitation 07/14/2021   UTI (urinary tract infection)    Valvular heart disease    Has mitral stenosis with prior mitral commissurotomy in 1970    Past Surgical History:  Procedure Laterality Date   ABDOMINAL HYSTERECTOMY  1983   endometriosis   APPENDECTOMY     BACK SURGERY     neurosurgery x2   CARDIAC CATHETERIZATION     CARDIAC CATHETERIZATION N/A 08/03/2016   Procedure: Right/Left Heart Cath and Coronary Angiography;  Surgeon: Peter M Martinique, MD;  Location: [facility];  Service: Cardiovascular;  Laterality: N/A;   cataract surg     CHOLECYSTECTOMY N/A 05/30/2015   Procedure: LAPAROSCOPIC CHOLECYSTECTOMY WITH INTRAOPERATIVE CHOLANGIOGRAM;  Surgeon: Excell Seltzer, MD;  Location: [facility];  Service: General;  Laterality:  N/A;   COLONOSCOPY     CORONARY ARTERY BYPASS GRAFT N/A 10/14/2016   Procedure: CORONARY ARTERY BYPASS GRAFTING (CABG);  Surgeon: Rexene Alberts, MD;  Location: [facility];  Service: Open Heart Surgery;  Laterality: N/A;   EYE SURGERY     MAZE N/A 10/14/2016   Procedure: MAZE;  Surgeon: Rexene Alberts, MD;  Location: [place];  Service: Open Heart Surgery;  Laterality: N/A;   MITRAL VALVE REPLACEMENT N/A 10/14/2016   Procedure: REDO MITRAL VALVE REPLACEMENT (MVR);  Surgeon: Rexene Alberts, MD;  Location: [place] [facility];  Service: Open Heart Surgery;  Laterality: N/A;   MITRAL VALVE SURGERY Left 1970   Open mitral commissurotomy via left thoracotomy approach   TEE WITHOUT CARDIOVERSION N/A 07/05/2016   Procedure: TRANSESOPHAGEAL ECHOCARDIOGRAM (TEE);  Surgeon: [name], MD;  Location: [facility];  Service: Cardiovascular;  Laterality: N/A;   TEE WITHOUT CARDIOVERSION N/A 10/14/2016   Procedure: TRANSESOPHAGEAL ECHOCARDIOGRAM (TEE);  Surgeon: Rexene Alberts, MD;  Location: [place];  Service: Open Heart Surgery;  Laterality: N/A;    Current Outpatient Medications  Medication Sig Dispense Refill   acetaminophen (TYLENOL) 500 MG tablet Take 1,000 mg by mouth every 6 (six) hours as needed for mild pain.     albuterol (PROVENTIL) (2.5 MG/3ML) 0.083% nebulizer solution Take 3 mLs (2.5 mg total) by nebulization every 4 (four) hours as needed for wheezing or shortness of breath. 75 mL 2   albuterol (VENTOLIN HFA) 108 (90 Base) MCG/ACT inhaler INHALE 2 PUFFS INTO THE LUNGS EVERY 4 HOURS AS NEEDED FOR AHEEZING OR SHORTNESS OF BREATH 8.5 g 2   bumetanide (BUMEX) 2 MG tablet Take 1 tablet (2 mg total) by mouth 2 (two) times daily. 60 tablet 3   cholecalciferol (VITAMIN D3) 25 MCG (1000 UNIT) tablet Take 1,000 Units by mouth daily.     diazepam (VALIUM) 5 MG tablet TAKE ONE TABLET AT BEDTIME 30 tablet 5   doxazosin (CARDURA) 8 MG tablet TAKE 1 TABLET DAILY 90 tablet 3   metoprolol succinate (TOPROL-XL) 25 MG 24 hr  tablet Take 1 tablet (25 mg total) by mouth daily. 30 tablet 1   Multiple Vitamins-Minerals (HAIR/SKIN/NAILS/BIOTIN PO) Take 1 tablet by mouth daily.     Multiple Vitamins-Minerals (MULTIVITAMIN ADULT EXTRA C) CHEW Chew 2 tablets by mouth daily.     potassium chloride (KLOR-CON) 10 MEQ tablet Take 4 tablets (40 mEq total) by mouth 2 (two) times daily.     rosuvastatin (CRESTOR) 5 MG tablet Take one tablet every Monday, wednesday Friday and Sunday 90 tablet 2   sacubitril-valsartan (ENTRESTO) 24-26 MG Take 1 tablet by mouth 2 (  two) times daily. 60 tablet 5   SYMBICORT 80-4.5 MCG/ACT inhaler INHALE 2 PUFFS TWICE DAILY 10.2 g 6   warfarin (COUMADIN) 2 MG tablet TAKE ONE TABLET DAILY BY MOUTH EXCEPT TAKE 1/2 TABLET ON WEDNESDAYS AND SATURDAYS OR AS DIRECTED BY ANTICOAGULATION CLINIC 100 tablet 1   No current facility-administered medications for this visit.    Allergies:   Aldactone [spironolactone], Amoxicillin, Diltiazem, Flagyl [metronidazole hcl], Flovent [fluticasone propionate], Gabapentin, Lyrica [pregabalin], Quinidine, Simvastatin, Tramadol, Verapamil, Amlodipine, Crestor [rosuvastatin], Livalo [pitavastatin], Zetia [ezetimibe], Benazepril hcl, Ciprofloxacin, Codeine, and Nitrofurantoin monohyd macro    Social History:  The patient  reports that she quit smoking about 46 years ago. Her smoking use included cigarettes. She has a 15.00 pack-year smoking history. She has never used smokeless tobacco. She reports that she does not drink alcohol and does not use drugs.   Family History:  The patient's family history includes Leukemia in her father.   ROS:   Please see the history of present illness.   (+) Cough (+) Sputum production (+) Chest congestion (+) Shortness of breath (+) Night sweats  (+) Fatigue (+) LE Edema L>R (+) LLE pain All other systems are reviewed and negative.   PHYSICAL EXAM:  VS:  BP (!) 158/68 (BP Location: Right Arm, Patient Position: Sitting)   Pulse (!) 47    Ht '5\' 3"'$  (1.6 m)   Wt 146 lb 3.2 oz (66.3 kg)   SpO2 97%   BMI 25.90 kg/m  , BMI Body mass index is 25.9 kg/m. GENERAL:  Well appearing HEENT: Pupils equal round and reactive, fundi not visualized, oral mucosa unremarkable NECK:  No jugular venous distention, waveform within normal limits, carotid upstroke brisk and symmetric, no bruits LUNGS:  Clear to auscultation bilaterally HEART:  RRR.  PMI not displaced or sustained,S1 and S2 within normal limits, no S3, no S4, no clicks, no rubs,  III/VI systolic murmur at the LUSB ABD:   Abdomen flat. Positive bowel sounds normal in frequency in pitch, no bruits, no rebound, no guarding, no midline pulsatile mass, no hepatomegaly, no splenomegaly EXT:  2 plus pulses throughout,  1+LLE edema (L>R) to the ankles, no cyanosis no clubbing SKIN:  L LE anterior tibia chronic stasis dermatitis NEURO:  Cranial nerves II through XII grossly intact, motor grossly intact throughout [facility]:  Cognitively intact, oriented to person place and time  EKG:   11/19/2021: EKG was not ordered. 07/14/2021:  No EKG was ordered. 07/21/20: Atrial fibrillation.  Rate 70 bpm.  Prior septal infarct. 01/10/20: Atrial fibrillation. Rate 56 bpm.  Prior septal infarct. 06/06/19: Atrial fibrillation.  Rate 61 bpm.  Prior septal infarct. 10/10/18: Sinus rhythm.  Rate 89 bpm.  Nonspecific ST changes.  01/05/18: Accelerated junctional rhythm.  Rate 68 bpm.  Incomplete RBBB.  04/26/17: Atrial fibrillation.  Rate 69 bpm. 03/08/17: Sinus bradycardia with sinus arrhythmia.   First degree heart block.  Junctional escape.  QTc 497 ms.  02/01/17: Sinus bradycardia with sinus arrhythmia and first degree block.  10/27/16: Sinus rhythm.  Rate 60 bpm.  First degree heart block. 05/21/16: atrial fibrillateion.  Prior septal infarct.  Rate 60 bpm.  Unchanged from 10/22/15.   Echo 10/30/2021:  1. Left ventricular ejection fraction, by estimation, is 60 to 65%. The  left ventricle has normal function. The  left ventricle has no regional  wall motion abnormalities. Left ventricular diastolic parameters are  indeterminate.   2. Right ventricular systolic function is mildly reduced. The right  ventricular size is  mildly enlarged. There is moderately elevated  pulmonary artery systolic pressure.   3. Left atrial size was moderately dilated.   4. Right atrial size was moderately dilated.   5. Normal appearing bioprosthetic MVR no PVL low mean gradient at HR 58  bpm Stent struts protrude into LVOT but no obvious sub valvular gradients  . The mitral valve has been repaired/replaced. No evidence of mitral valve  regurgitation. No evidence of mitral stenosis.   6. Tricuspid valve regurgitation is moderate.   7. The aortic valve is tricuspid. There is moderate calcification of the  aortic valve. There is moderate thickening of the aortic valve. Aortic  valve regurgitation is not visualized. Mild aortic valve stenosis.   8. The inferior vena cava is normal in size with greater than 50%  respiratory variability, suggesting right atrial pressure of 3 mmHg.   CTA Chest 10/29/2021: COMPARISON:  Chest x-ray from earlier in the same day.   FINDINGS: Cardiovascular: Atherosclerotic calcifications of the thoracic aorta are noted. Changes consistent with prior coronary bypass grafting are noted. No aneurysmal dilatation is seen. Heart is at the upper limits of normal in size. The pulmonary artery shows a normal branching pattern. No intraluminal filling defect to suggest pulmonary embolism is seen. Mild coronary calcifications are noted.   Mediastinum/Nodes: Thoracic inlet is within normal limits. No sizable hilar or mediastinal adenopathy is noted. The esophagus as visualized is within normal limits.   Lungs/Pleura: Mild right middle lobe atelectatic changes are noted. Small pleural effusions are noted bilaterally. No sizable parenchymal nodules are seen. No pneumothorax is noted.   Upper Abdomen:  Visualized upper abdomen is within normal limits.   Musculoskeletal: Postsurgical changes in the cervical spine are seen. No acute bony abnormality is noted.   Review of the MIP images confirms the above findings.   IMPRESSION: No evidence of pulmonary emboli.   Right middle lobe atelectatic changes.   Small pleural effusions are seen.   Aortic Atherosclerosis (ICD10-I70.0).  Echo 07/07/2021 1. Left ventricular ejection fraction, by estimation, is 60 to 65%. The  left ventricle has normal function. The left ventricle has no regional  wall motion abnormalities. Left ventricular diastolic function could not  be evaluated.   2. Right ventricular systolic function is mildly reduced. The right  ventricular size is mildly enlarged. There is mildly elevated pulmonary  artery systolic pressure. The estimated right ventricular systolic  pressure is 41.9 mmHg.   3. Left atrial size was severely dilated.   4. Right atrial size was severely dilated.   5. 29 mm Medtronic Mosaic Bioprosthesis in mitral position (10/14/2016).  MG 5.5 @ 55 bpm. E max 2.1 m/s, PHT ~120 msec, EOA 2.1 cm2, DI 1.36. All  parameters are within normal limits for this prosthetic valve. The mitral  valve has been repaired/replaced.  No evidence of mitral valve regurgitation. Procedure Date: 10/14/2016.   6. There is at least moderate TR. there is systolic flow reversal in the  hepatic veins suggestive of significant TR. Interrogation poor on this  study. Tricuspid valve regurgitation is moderate to severe.   7. The aortic valve is tricuspid. There is mild calcification of the  aortic valve. There is mild thickening of the aortic valve. Aortic valve  regurgitation is not visualized. Mild to moderate aortic valve  sclerosis/calcification is present, without any  evidence of aortic stenosis.   8. The inferior vena cava is dilated in size with >50% respiratory  variability, suggesting right atrial pressure of 8 mmHg.  Echo 09/2019: 1. Left ventricular ejection fraction, by visual estimation, is 65 to  70%. The left ventricle has hyperdynamic function. Left ventricular septal  wall thickness was mildly increased. There is mildly increased left  ventricular hypertrophy.   2. The left ventricle has no regional wall motion abnormalities.   3. Global right ventricle has normal systolic function.The right  ventricular size is normal. No increase in right ventricular wall  thickness.   4. Left atrial size was moderately dilated.   5. Right atrial size was moderately dilated.   6. The mitral valve has been repaired/replaced. No evidence of mitral  valve regurgitation. No evidence of mitral stenosis.   7. Patient has a 29 mm biprosthetic valve functioning normally with no  PVL The mitral struts protrude into the LVOT quite a bit likely  contributing to some of the gradient across the LVOT/AV. Creates a small  "neointimal LVOT" area.   8. The tricuspid valve is normal in structure.   9. The aortic valve is tricuspid. Aortic valve regurgitation is not  visualized. Mild aortic valve stenosis.  10. Pulmonic regurgitation is mild.  11. The pulmonic valve was grossly normal. Pulmonic valve regurgitation is  mild.  12. Mildly elevated pulmonary artery systolic pressure.   Echo 07/06/18: Study Conclusions   - Left ventricle: The cavity size was normal. Systolic function was   vigorous. The estimated ejection fraction was in the range of 65%   to 70%. Wall motion was normal; there were no regional wall   motion abnormalities. Doppler parameters are consistent with high   ventricular filling pressure. - Aortic valve: Trileaflet; mildly thickened, mildly calcified   leaflets. There was mild stenosis. Peak velocity (S): 266 cm/s.   Mean gradient (S): 16 mm Hg. - Mitral valve: A bioprosthesis was present and functioning   normally. - Left atrium: The atrium was severely dilated. Anterior-posterior   dimension:  60 mm. - Tricuspid valve: There was mild regurgitation.  7 Day Event Monitor 06/28/17:   Quality: Fair.  Baseline artifact. Predominant rhythm: Sinus rhythm with PACs and sinus arrhythmia.     Cannot  rule out episodes of atrial fibrillation.  Carotid Dopplers 10/12/16: 1-39% bilateral ICA stenosis  LHC/RHC 08/03/16: The left ventricular systolic function is normal. LV end diastolic pressure is normal. The left ventricular ejection fraction is 55-65% by visual estimate. There is no aortic valve stenosis. There is severe mitral valve stenosis. Prox Cx to Mid Cx lesion, 50 %stenosed. Mid RCA-2 lesion, 80 %stenosed. Mid RCA-1 lesion, 80 %stenosed. Hemodynamic findings consistent with mild pulmonary hypertension. LV end diastolic pressure is normal.   1. Coronary artery disease   - 50% mid LCx   - 80% sequential lesions in the mid RCA 2. Normal LV function 3. Severe mitral stenosis. MV gradient of 13 mm Hg. MVA 0.99 cm squared with index 0.57. 4. Moderate to severe mitral insufficiency 5. Mild pulmonary HTN. 6. Normal LV EDP 7. No significant AV gradient 8. Occluded right brachial artery.   Recent Labs: 01/05/2022: ALT 21; TSH 1.716 01/12/2022: Magnesium 2.4 01/18/2022: B Natriuretic Peptide 153.2; Hemoglobin 15.1; Platelets 180 03/17/2022: BUN 11; Creatinine, Ser 1.09; Potassium 4.1; Sodium 139    Lipid Panel    Component Value Date/Time   CHOL 146 05/22/2021 0843   TRIG 109 05/22/2021 0843   HDL 56 05/22/2021 0843   CHOLHDL 2.6 05/22/2021 0843   CHOLHDL 2.8 07/09/2020 1403   VLDL 42.8 (H) 09/24/2019 1054   LDLCALC 70 05/22/2021 0843  LDLCALC 77 07/09/2020 1403   LDLDIRECT 68.0 09/24/2019 1054      Wt Readings from Last 3 Encounters:  03/19/22 145 lb (65.8 kg)  03/08/22 147 lb 8 oz (66.9 kg)  03/04/22 146 lb 3.2 oz (66.3 kg)     ASSESSMENT AND PLAN:  No problem-specific Assessment & Plan notes found for this encounter. S/P mitral valve replacement with  bioprosthetic valve + CABG x1 + maze procedure Mitral valve stable on echo during her recent admission.  She is worsening volume overload.  We will switch torsemide to Bumex 2 mg twice daily.  Check a BMP in a week.   Atrial fibrillation (HCC) Currently in sinus rhythm.  She is anticoagulated with warfarin.  Continue metoprolol.  Amiodarone was stopped due to intolerance.  INRs have been labile.  Check INR today.   S/P CABG x 1 She has no angina.  Encouraged her to gradually increase her exercise by starting with walking around her home.  Continue rosuvastatin.  She isn't on aspirin as she is on warfarin.  Refer to PREP.   Acute on chronic diastolic heart failure ([place]) Essential hypertension She is volume overloaded today and is not getting good urinary output this month.  We will try Bumex 2 mg bid.  Stop torsemide.  We will also switch losartan to Entresto 24/26 mg twice daily.  Check a BMP in a week.  Blood pressures were elevated in the office today but have been controlled at home.    Current medicines are reviewed at length with the patient today.  The patient does not have concerns regarding medicines.  The following changes have been made: Switch losartan to Entresto.  Switch torsemide to Bumex.  Labs/ tests ordered today include:   Orders Placed This Encounter  Procedures   Protime-INR    Disposition:   FU with Shahid Flori C. Oval Linsey, MD, [facility] in 3 months.  [name], NP in 2 weeks.   Signed, Mikeala Girdler C. Oval Linsey, MD, [facility]  03/31/2022 7:55 AM    [place]

## 2022-03-05 ENCOUNTER — Ambulatory Visit (INDEPENDENT_AMBULATORY_CARE_PROVIDER_SITE_OTHER): Payer: Medicare Other

## 2022-03-05 DIAGNOSIS — Z7901 Long term (current) use of anticoagulants: Secondary | ICD-10-CM | POA: Diagnosis not present

## 2022-03-05 LAB — PROTIME-INR
INR: 1.9 — ABNORMAL HIGH (ref 0.9–1.2)
Prothrombin Time: 19.3 s — ABNORMAL HIGH (ref 9.1–12.0)

## 2022-03-05 NOTE — Telephone Encounter (Signed)
INR result in of 1.9 from yesterday. Contacted pt with dosing instructions.

## 2022-03-05 NOTE — Patient Instructions (Addendum)
Pre visit review using our clinic review tool, if applicable. No additional management support is needed unless otherwise documented below in the visit note.  Increase dose today to take 1 1/2 tablet and then continue 1/2 tablet daily except take 1 tablet on Mondays, Wednesdays and Fridays. Recheck in 1 week at lab at [facility] on 6/15.

## 2022-03-05 NOTE — Progress Notes (Signed)
Pt was seen by cardiology yesterday where she had requested a lab INR so she could reduce trips to apts.   Lab INR from yesterday was 1.9 which is a significant improvement over last week of 5.3 for her INR.  Pt was advised by cardiology to stop amiodarone last week due to side effects. Pt has also been experiencing edema. Medication changes by cardiology are addition of bumex and entresto.   Increase dose today to take 1 1/2 tablet and then continue 1/2 tablet daily except take 1 tablet on Mondays, Wednesdays and Fridays. Recheck in 1 week at [facility] lab on 6/15.  Contacted pt by phone and advised. Scheduled pt for lab on 6/15 for BMET, which was requested by cardiology and INR for coumadin clinic.

## 2022-03-08 ENCOUNTER — Ambulatory Visit (INDEPENDENT_AMBULATORY_CARE_PROVIDER_SITE_OTHER): Payer: Medicare Other | Admitting: Family Medicine

## 2022-03-08 ENCOUNTER — Telehealth (HOSPITAL_BASED_OUTPATIENT_CLINIC_OR_DEPARTMENT_OTHER): Payer: Self-pay

## 2022-03-08 ENCOUNTER — Encounter: Payer: Self-pay | Admitting: Family Medicine

## 2022-03-08 VITALS — BP 136/60 | HR 50 | Temp 98.1°F | Ht 63.0 in | Wt 147.5 lb

## 2022-03-08 DIAGNOSIS — R600 Localized edema: Secondary | ICD-10-CM | POA: Diagnosis not present

## 2022-03-08 DIAGNOSIS — S32010D Wedge compression fracture of first lumbar vertebra, subsequent encounter for fracture with routine healing: Secondary | ICD-10-CM

## 2022-03-08 DIAGNOSIS — M545 Low back pain, unspecified: Secondary | ICD-10-CM | POA: Diagnosis not present

## 2022-03-08 NOTE — Patient Instructions (Signed)
Check at pharmacy to clarify prescriptions  Looks like you should be on Bumex (bumetanide) 2 mg twice daily and not Budesonide '3mg'$ .

## 2022-03-08 NOTE — Telephone Encounter (Addendum)
Called results to patient and left results on VM (ok per DPR), instructions left to call office back if patient has any questions!     ----- Message from [name], MD sent at 03/08/2022  1:37 PM EDT ----- INR is a little bit low at 1.9.  Coumadin is managed by Dr. Erick Blinks team

## 2022-03-08 NOTE — Progress Notes (Signed)
Established Patient Office Visit  Subjective   Patient ID: Catherine [name], female    DOB: 16-Mar-1935  Age: 87 y.o. MRN: [id number]  Chief Complaint  Patient presents with   Follow-up    HPI   Catherine [name] is seen for follow-up regarding recent onset low back pain.  Onset occurred around the middle of May.  She was at home cleaning some windows and was in a twisted position trying to lift up a window when she felt a pop sensation in her low back area.  She had poorly localized back pain follow-up.  Tylenol helps somewhat.  She had severe pain at times.  She had difficulty sleeping.  We obtained x-ray lumbar spine which did show anterior wedge compression L1 which had not been previously noted on lumbar films 10/22.  We will prescribe some tramadol which helps some.  She states her pain is better at this time though not fully resolved.  She is coping fairly well and sleeping better.  She has not had bone density scan in several years.  She is somewhat reluctant to consider osteoporosis medication such as Fosamax at this time.  Her major complaint is some recent weight gain and increased fluid issues especially in her feet and ankles.  She was started on Entresto and Bumex recently per her cardiologist.  Her losartan and torsemide were discontinued.  Patient brings in prescriptions today and apparently at the pharmacy got prescription filled for [facility] and also budesonide 3 mg twice daily.  It looks like the budesonide may have been filled in error in place of the Bumex.  Her current home weight is up to about 145 pounds from her previous around 141 pounds.  Past Medical History:  Diagnosis Date   Acute on chronic diastolic heart failure (HCC)    Aortic stenosis, mild 07/26/2017   Arthritis    Asthma    last attack 02/2015   Atrial fibrillation, chronic (HCC)    Cellulitis of left lower extremity 08/17/2016   Ulcer associated with severe venous insufficiency   Chronic anticoagulation     Chronic diastolic CHF (congestive heart failure) ([place])    Chronic kidney disease    "RIGHT MANY KIDNEY INFECTIONS AND STONES"   COPD (chronic obstructive pulmonary disease) (HCC)    Coronary artery disease    Dizziness    H/O: rheumatic fever    Heart murmur    Hypertension    PONV (postoperative nausea and vomiting)    ' SOMETIMES', BUT NOT ALWAYS"   S/P Maze operation for atrial fibrillation 10/14/2016   Complete bilateral atrial lesion set using cryothermy and bipolar radiofrequency ablation - atrial appendage was not treated due to previous surgical procedure (open mitral commissurotomy)   S/P mitral valve replacement with bioprosthetic valve 10/14/2016   29 mm Medtronic Mosaic porcine bioprosthetic tissue valve   Tricuspid regurgitation 07/14/2021   UTI (urinary tract infection)    Valvular heart disease    Has mitral stenosis with prior mitral commissurotomy in 1970   Past Surgical History:  Procedure Laterality Date   ABDOMINAL HYSTERECTOMY  1983   endometriosis   APPENDECTOMY     BACK SURGERY     neurosurgery x2   CARDIAC CATHETERIZATION     CARDIAC CATHETERIZATION N/A 08/03/2016   Procedure: Right/Left Heart Cath and Coronary Angiography;  Surgeon: Peter M Martinique, MD;  Location: [facility];  Service: Cardiovascular;  Laterality: N/A;   cataract surg     CHOLECYSTECTOMY N/A 05/30/2015  Procedure: LAPAROSCOPIC CHOLECYSTECTOMY WITH INTRAOPERATIVE CHOLANGIOGRAM;  Surgeon: Excell Seltzer, MD;  Location: [place];  Service: General;  Laterality: N/A;   COLONOSCOPY     CORONARY ARTERY BYPASS GRAFT N/A 10/14/2016   Procedure: CORONARY ARTERY BYPASS GRAFTING (CABG);  Surgeon: Rexene Alberts, MD;  Location: [place];  Service: Open Heart Surgery;  Laterality: N/A;   EYE SURGERY     MAZE N/A 10/14/2016   Procedure: MAZE;  Surgeon: Rexene Alberts, MD;  Location: [place];  Service: Open Heart Surgery;  Laterality: N/A;   MITRAL VALVE REPLACEMENT N/A 10/14/2016   Procedure: REDO  MITRAL VALVE REPLACEMENT (MVR);  Surgeon: Rexene Alberts, MD;  Location: [place];  Service: Open Heart Surgery;  Laterality: N/A;   MITRAL VALVE SURGERY Left 1970   Open mitral commissurotomy via left thoracotomy approach   TEE WITHOUT CARDIOVERSION N/A 07/05/2016   Procedure: TRANSESOPHAGEAL ECHOCARDIOGRAM (TEE);  Surgeon: [name], MD;  Location: [place];  Service: Cardiovascular;  Laterality: N/A;   TEE WITHOUT CARDIOVERSION N/A 10/14/2016   Procedure: TRANSESOPHAGEAL ECHOCARDIOGRAM (TEE);  Surgeon: Rexene Alberts, MD;  Location: [place];  Service: Open Heart Surgery;  Laterality: N/A;    reports that she quit smoking about 46 years ago. Her smoking use included cigarettes. She has a 15.00 pack-year smoking history. She has never used smokeless tobacco. She reports that she does not drink alcohol and does not use drugs. family history includes Leukemia in her father. Allergies  Allergen Reactions   Aldactone [Spironolactone] Other (See Comments)    dyspnea   Amoxicillin Palpitations    Tachycardia Has patient had a PCN reaction causing immediate rash, facial/tongue/throat swelling, SOB or lightheadedness with hypotension: no Has patient had a PCN reaction causing severe rash involving mucus membranes or skin necrosis: {no Has patient had a PCN reaction that required hospitalization {no Has patient had a PCN reaction occurring within the last 10 years: {yes If all of the above answers are "NO", then may proceed with Cephalosporin use.   Diltiazem Other (See Comments)    Causing headaches    Flagyl [Metronidazole Hcl] Other (See Comments)    Causing headaches     Flovent [Fluticasone Propionate] Other (See Comments)    Leg cramps   Gabapentin Swelling   Lyrica [Pregabalin] Swelling   Quinidine Diarrhea and Other (See Comments)    Fever diarrhea   Simvastatin Other (See Comments)    Leg pain, myalgia   Tramadol Nausea Only   Verapamil Other (See Comments)    myalgias    Amlodipine     Low extremity edema   Crestor [Rosuvastatin]     myalgia   Livalo [Pitavastatin]     myalgias   Zetia [Ezetimibe]     LEG CRAMPS    Benazepril Hcl Cough   Ciprofloxacin Diarrhea   Codeine Nausea Only   Nitrofurantoin Monohyd Macro Nausea Only    Review of Systems  Constitutional:  Negative for chills, fever and weight loss.  Respiratory:  Negative for cough and wheezing.   Cardiovascular:  Positive for leg swelling. Negative for chest pain.  Musculoskeletal:  Positive for back pain.  Neurological:  Negative for weakness.      Objective:     BP 136/60 (BP Location: Left Arm, Patient Position: Sitting, Cuff Size: Normal)   Pulse (!) 50   Temp 98.1 F (36.7 C) (Oral)   Ht '5\' 3"'$  (1.6 m)   Wt 147 lb 8 oz (66.9 kg)   SpO2 98%  BMI 26.13 kg/m  BP Readings from Last 3 Encounters:  03/08/22 136/60  03/04/22 (!) 158/68  03/02/22 130/68   Wt Readings from Last 3 Encounters:  03/08/22 147 lb 8 oz (66.9 kg)  03/04/22 146 lb 3.2 oz (66.3 kg)  03/02/22 144 lb (65.3 kg)      Physical Exam Vitals reviewed.  Constitutional:      Appearance: Normal appearance.  Cardiovascular:     Rate and Rhythm: Normal rate.  Pulmonary:     Effort: Pulmonary effort is normal.     Breath sounds: Normal breath sounds. No wheezing or rales.  Musculoskeletal:     Right lower leg: Edema present.     Left lower leg: Edema present.     Comments: Some edema involving lower legs feet and ankles bilaterally.  No spinal tenderness thoracic or lumbar region  Neurological:     Mental Status: She is alert.      No results found for any visits on 03/08/22.  Last CBC Lab Results  Component Value Date   WBC 16.6 (H) 01/18/2022   HGB 15.1 (H) 01/18/2022   HCT 41.4 01/18/2022   MCV 94.3 01/18/2022   MCH 34.4 (H) 01/18/2022   RDW 13.7 01/18/2022   PLT 180 02/54/2706   Last metabolic panel Lab Results  Component Value Date   GLUCOSE 118 (H) 01/18/2022   NA 141  01/18/2022   K 3.5 01/18/2022   CL 104 01/18/2022   CO2 26 01/18/2022   BUN 28 (H) 01/18/2022   CREATININE 0.96 01/18/2022   GFRNONAA 58 (L) 01/18/2022   CALCIUM 9.0 01/18/2022   PROT 7.1 01/05/2022   ALBUMIN 4.1 01/05/2022   LABGLOB 2.3 06/06/2019   AGRATIO 2.0 06/06/2019   BILITOT 0.2 (L) 01/05/2022   ALKPHOS 87 01/05/2022   AST 19 01/05/2022   ALT 21 01/05/2022   ANIONGAP 11 01/18/2022      The ASCVD Risk score (Arnett DK, et al., 2019) failed to calculate for the following reasons:   The 2019 ASCVD risk score is only valid for ages 69 to 2    Assessment & Plan:   #1 low back pain following fall.  Suspect her pain may have been more muscular in origin even though she did have evidence for L1 compression fracture but not sure regarding the age.  She is not tender whatsoever around L1 at this time.  Her pain is already improving.  Continue tramadol as needed and supplement with Tylenol as needed  #2 compression fracture L1.  Noted on recent x-ray.  Age-indeterminate.  No tenderness over L1 at this time.  We discussed possible DEXA scan but she declines at this time.  #3 increased peripheral edema.  Longstanding history of diastolic heart failure.  Looks like she had prescription filled at pharmacy for budesonide versus bumetanide which may be accounting for her edema issues.  We will see if we can clarify this with the pharmacist and through cardiology office  No follow-ups on file.    [name], MD

## 2022-03-11 ENCOUNTER — Telehealth: Payer: Self-pay

## 2022-03-11 ENCOUNTER — Other Ambulatory Visit (INDEPENDENT_AMBULATORY_CARE_PROVIDER_SITE_OTHER): Payer: Medicare Other

## 2022-03-11 ENCOUNTER — Ambulatory Visit (INDEPENDENT_AMBULATORY_CARE_PROVIDER_SITE_OTHER): Payer: Medicare Other

## 2022-03-11 DIAGNOSIS — Z5181 Encounter for therapeutic drug level monitoring: Secondary | ICD-10-CM

## 2022-03-11 DIAGNOSIS — T45515A Adverse effect of anticoagulants, initial encounter: Secondary | ICD-10-CM

## 2022-03-11 DIAGNOSIS — I10 Essential (primary) hypertension: Secondary | ICD-10-CM

## 2022-03-11 DIAGNOSIS — Z7901 Long term (current) use of anticoagulants: Secondary | ICD-10-CM | POA: Diagnosis not present

## 2022-03-11 DIAGNOSIS — D6832 Hemorrhagic disorder due to extrinsic circulating anticoagulants: Secondary | ICD-10-CM

## 2022-03-11 LAB — POCT INR: INR: 3.8 — AB (ref 2.0–3.0)

## 2022-03-11 NOTE — Progress Notes (Signed)
Pt was in clinic today for lab to perform POCT INR and lab for cardiology.  Cardiology lab was not drawn today because lab did not see an order for a BMET. Will need pt to come in to have that redrawn for cardiology. Contacted pt by phone and advised of missed lab for cardiology and dosing instructions. Pt will return next week for INR check and a msg will be sent to cardiology about the lab and to place an order so it can be drawn next week. Pt verbalize understanding.  Sent msg to cardiology.  Hold dose today and then change weekly dose to take 1/2 tablet daily except take 1 tablet on Mondays and Fridays. Recheck in 1 week.

## 2022-03-11 NOTE — Patient Instructions (Addendum)
Pre visit review using our clinic review tool, if applicable. No additional management support is needed unless otherwise documented below in the visit note.  Hold dose today and then change weekly dose to take 1/2 tablet daily except take 1 tablet on Mondays and Fridays. Recheck in 1 week.

## 2022-03-11 NOTE — Telephone Encounter (Signed)
Pt went to [place] today to have her INR checked. The lab was also supposed to draw a BMET ordered by cardiology but the order was not placed. The lab drew a protime INR instead. Pt will return next week to coumadin clinic and can have the BMET drawn then. Sending msg to cardiology to add BMET order.

## 2022-03-12 NOTE — Telephone Encounter (Signed)
Order placed for BMET  

## 2022-03-14 DIAGNOSIS — J449 Chronic obstructive pulmonary disease, unspecified: Secondary | ICD-10-CM | POA: Diagnosis not present

## 2022-03-15 ENCOUNTER — Other Ambulatory Visit: Payer: Self-pay | Admitting: Cardiovascular Disease

## 2022-03-15 DIAGNOSIS — I10 Essential (primary) hypertension: Secondary | ICD-10-CM

## 2022-03-16 ENCOUNTER — Ambulatory Visit (INDEPENDENT_AMBULATORY_CARE_PROVIDER_SITE_OTHER): Payer: Medicare Other

## 2022-03-16 DIAGNOSIS — I4811 Longstanding persistent atrial fibrillation: Secondary | ICD-10-CM

## 2022-03-16 DIAGNOSIS — J441 Chronic obstructive pulmonary disease with (acute) exacerbation: Secondary | ICD-10-CM

## 2022-03-16 DIAGNOSIS — I10 Essential (primary) hypertension: Secondary | ICD-10-CM

## 2022-03-16 DIAGNOSIS — I5043 Acute on chronic combined systolic (congestive) and diastolic (congestive) heart failure: Secondary | ICD-10-CM

## 2022-03-16 NOTE — Chronic Care Management (AMB) (Signed)
Chronic Care Management   CCM RN Visit Note  03/16/2022 Name: Catherine [name] MRN: [id number] DOB: 12/26/1934  Subjective: Catherine [name] is a 87 y.o. year old female who is a primary care patient of Burchette, Alinda Sierras, MD. The care management team was consulted for assistance with disease management and care coordination needs.    Engaged with patient by telephone for follow up visit in response to provider referral for case management and/or care coordination services.   Consent to Services:  The patient was given information about Chronic Care Management services, agreed to services, and gave verbal consent prior to initiation of services.  Please see initial visit note for detailed documentation.   Patient agreed to services and verbal consent obtained.   Assessment: Review of patient past medical history, allergies, medications, health status, including review of consultants reports, laboratory and other test data, was performed as part of comprehensive evaluation and provision of chronic care management services.   SDOH (Social Determinants of Health) assessments and interventions performed:    CCM Care Plan  Allergies  Allergen Reactions   Aldactone [Spironolactone] Other (See Comments)    dyspnea   Amoxicillin Palpitations    Tachycardia Has patient had a PCN reaction causing immediate rash, facial/tongue/throat swelling, SOB or lightheadedness with hypotension: no Has patient had a PCN reaction causing severe rash involving mucus membranes or skin necrosis: {no Has patient had a PCN reaction that required hospitalization {no Has patient had a PCN reaction occurring within the last 10 years: {yes If all of the above answers are "NO", then may proceed with Cephalosporin use.   Diltiazem Other (See Comments)    Causing headaches    Flagyl [Metronidazole Hcl] Other (See Comments)    Causing headaches     Flovent [Fluticasone Propionate] Other (See Comments)    Leg cramps    Gabapentin Swelling   Lyrica [Pregabalin] Swelling   Quinidine Diarrhea and Other (See Comments)    Fever diarrhea   Simvastatin Other (See Comments)    Leg pain, myalgia   Tramadol Nausea Only   Verapamil Other (See Comments)    myalgias   Amlodipine     Low extremity edema   Crestor [Rosuvastatin]     myalgia   Livalo [Pitavastatin]     myalgias   Zetia [Ezetimibe]     LEG CRAMPS    Benazepril Hcl Cough   Ciprofloxacin Diarrhea   Codeine Nausea Only   Nitrofurantoin Monohyd Macro Nausea Only    Outpatient Encounter Medications as of 03/16/2022  Medication Sig   acetaminophen (TYLENOL) 500 MG tablet Take 1,000 mg by mouth every 6 (six) hours as needed for mild pain.   albuterol (PROVENTIL) (2.5 MG/3ML) 0.083% nebulizer solution Take 3 mLs (2.5 mg total) by nebulization every 4 (four) hours as needed for wheezing or shortness of breath.   albuterol (VENTOLIN HFA) 108 (90 Base) MCG/ACT inhaler INHALE 2 PUFFS INTO THE LUNGS EVERY 4 HOURS AS NEEDED FOR AHEEZING OR SHORTNESS OF BREATH   budesonide (ENTOCORT EC) 3 MG 24 hr capsule Take 3 mg by mouth 2 (two) times daily.   bumetanide (BUMEX) 2 MG tablet Take 1 tablet (2 mg total) by mouth 2 (two) times daily.   cholecalciferol (VITAMIN D3) 25 MCG (1000 UNIT) tablet Take 1,000 Units by mouth daily.   diazepam (VALIUM) 5 MG tablet TAKE ONE TABLET AT BEDTIME   doxazosin (CARDURA) 8 MG tablet TAKE 1 TABLET DAILY   metoprolol succinate (TOPROL-XL) 25 MG 24  hr tablet Take 1 tablet (25 mg total) by mouth daily.   Multiple Vitamins-Minerals (HAIR/SKIN/NAILS/BIOTIN PO) Take 1 tablet by mouth daily.   Multiple Vitamins-Minerals (MULTIVITAMIN ADULT EXTRA C) CHEW Chew 2 tablets by mouth daily.   potassium chloride (KLOR-CON) 10 MEQ tablet Take 4 tablets (40 mEq total) by mouth 2 (two) times daily.   rosuvastatin (CRESTOR) 5 MG tablet Take one tablet every Monday, wednesday Friday and Sunday   sacubitril-valsartan (ENTRESTO) 24-26 MG Take 1  tablet by mouth 2 (two) times daily.   SYMBICORT 80-4.5 MCG/ACT inhaler INHALE 2 PUFFS TWICE DAILY   warfarin (COUMADIN) 2 MG tablet TAKE ONE TABLET DAILY BY MOUTH EXCEPT TAKE 1/2 TABLET ON WEDNESDAYS AND SATURDAYS OR AS DIRECTED BY ANTICOAGULATION CLINIC   No facility-administered encounter medications on file as of 03/16/2022.    Patient Active Problem List   Diagnosis Date Noted   COPD  GOLD 2 03/02/2022   Hypertension    Chronic kidney disease    Anxiety    Acute exacerbation of CHF (congestive heart failure) ([place]) 01/05/2022   Chest wall pain 11/02/2021   Nausea without vomiting 10/30/2021   Tricuspid regurgitation 07/14/2021   Protein-calorie malnutrition, severe 09/05/2019   Dehydration    Gastroenteritis    Enteritis 09/03/2019   COVID-19 08/27/2019   Dysuria 07/07/2019   Chronic anxiety 12/23/2017   Aortic stenosis, mild 07/26/2017   S/P Maze operation for atrial fibrillation 10/14/2016   S/P CABG x 1 10/14/2016   Cellulitis of left lower extremity 08/17/2016   Coronary artery disease    Chronic insomnia 07/12/2016   DOE (dyspnea on exertion) 03/26/2015   Warfarin-induced coagulopathy ([place]) 03/16/2015   Valvular heart disease 03/16/2015   Nephrolithiasis 03/16/2015   Acute respiratory failure with hypoxia  secondary to acute on chronic COPD and diastolic congestive heart failure exacerbation 03/11/2015   Essential hypertension    RHEUMATIC MITRAL STENOSIS 01/05/2008   Rheumatic heart disease 01/05/2008   Atrial fibrillation ([place]) 01/05/2008    Conditions to be addressed/monitored:Atrial Fibrillation, CHF, CAD, HTN, HLD, and COPD  Care Plan : RN Care Manager Plan of Care  Updates made by Dimitri Ped, RN since 03/16/2022 12:00 AM     Problem: Chronic Disease Management and Care Coordination Needs (CHF,COPD, Atrial Fib, HTN, HLD,CAD)   Priority: High     Long-Range Goal: Establish Plan of Care for Chronic Disease Management Needs (CHF,COPD, Atrial Fib,  HTN, HLD,CAD)   Start Date: 11/06/2021  Expected End Date: 11/06/2022  Priority: High  Note:   Current Barriers:  Knowledge Deficits related to plan of care for management of Atrial Fibrillation, CHF, CAD, HTN, HLD, and COPD  Chronic Disease Management support and education needs related to Atrial Fibrillation, CHF, CAD, HTN, HLD, and COPD    States she is using her inhalers as ordered and has not needed to use her Albuterol inhaler.  States she is weighting daily and her weight was 139 today. States she had swelling that she saw her doctor and she put her on a new fluid pill. States her weight went down 6 lbs when she started the Bumex.  Denies any chest pains or increase in swelling.  States she felt better when the  Amiodarone was stopped States she tries to not use salt and mostly cooks at home. States  back pain since she came home from the hospital and she is trying to walk as she can to help it. States she saw the lung doctor but is is not  clear  about his instructions  RNCM Clinical Goal(s):  Patient will verbalize understanding of plan for management of Atrial Fibrillation, CHF, CAD, HTN, HLD, and COPD as evidenced by voiced adherence to plan of care verbalize basic understanding of  Atrial Fibrillation, CHF, CAD, HTN, HLD, and COPD disease process and self health management plan as evidenced by voiced understanding and teach back take all medications exactly as prescribed and will call provider for medication related questions as evidenced by readings within limits, voiced adherence to plan of care attend all scheduled medical appointments: Coumadin clinic 03/17/22, Dr. [name] 95/23, Cardiology 03/19/22, CCM PharmD 04/20/22 as evidenced by medical records demonstrate Improved adherence to prescribed treatment plan for Atrial Fibrillation, CHF, CAD, HTN, HLD, and COPD as evidenced by readings within limits, voiced adherence to plan of care continue to work with RN Care Manager to address care  management and care coordination needs related to  Atrial Fibrillation, CHF, CAD, HTN, HLD, and COPD as evidenced by adherence to CM Team Scheduled appointments work with pharmacist to address polypharmacy and adherence related toAtrial Fibrillation, CHF, CAD, HTN, HLD, and COPD as evidenced by review or EMR and patient or pharmacist report through collaboration with RN Care manager, provider, and care team.   Interventions: 1:1 collaboration with primary care provider regarding development and update of comprehensive plan of care as evidenced by provider attestation and co-signature Inter-disciplinary care team collaboration (see longitudinal plan of care) Evaluation of current treatment plan related to  self management and patient's adherence to plan as established by provider   AFIB Interventions: (Status:  Goal on track:  Yes.) Long Term Goal   Counseled on increased risk of stroke due to Afib and benefits of anticoagulation for stroke prevention Reviewed importance of adherence to anticoagulant exactly as prescribed Counseled on bleeding risk associated with Coumadin and importance of self-monitoring for signs/symptoms of bleeding Counseled on importance of regular laboratory monitoring as prescribed Counseled on seeking medical attention after a head injury or if there is blood in the urine/stool  Reviewed to keep Coumadin check appointments Reinforced foods to avoid with Coumadin   CAD Interventions: (Status:  Goal on track:  Yes.) Long Term Goal Assessed understanding of CAD diagnosis Medications reviewed including medications utilized in CAD treatment plan Provided education on importance of blood pressure control in management of CAD Provided education on Importance of limiting foods high in cholesterol Counseled on importance of regular laboratory monitoring as prescribed Reviewed Importance of taking all medications as prescribed Reviewed Importance of attending all scheduled  provider appointments Advised to report any changes in symptoms or exercise tolerance   Heart Failure Interventions:  (Status:  Goal on track:  Yes.) Long Term Goal Basic overview and discussion of pathophysiology of Heart Failure reviewed Provided education on low sodium diet Reviewed Heart Failure Action Plan in depth and provided written copy Discussed importance of daily weight and advised patient to weigh and record daily Reviewed role of diuretics in prevention of fluid overload and management of heart failure; Discussed the importance of keeping all appointments with provider Reviewed to take Bumex and Entresto as instructed. Reinforced to follow  low sodium diet and fluid restrictions. Reinforced s/sx of HF to call provider.  Reinforced to keep legs elevated when sitting  COPD Interventions:  (Status:  Goal on track:  Yes.) Long Term Goal Provided patient with basic written and verbal COPD education on self care/management/and exacerbation prevention Advised patient to track and manage COPD triggers Provided instruction about proper use  of medications used for management of COPD including inhalers Advised patient to self assesses COPD action plan zone and make appointment with provider if in the yellow zone for 48 hours without improvement Provided education about and advised patient to utilize infection prevention strategies to reduce risk of respiratory infection Discussed the importance of adequate rest and management of fatigue with COPD Reviewed pt instructions from 03/02/22 visit with pulmonology with pt.  Reviewed use of Symbicort everyday and to use albuterol only as needed.  Reinforced to pace activity and to gradually increase as tolerated   Hyperlipidemia Interventions:  (Status:  Condition stable.  Not addressed this visit.) Long Term Goal Medication review performed; medication list updated in electronic medical record.  Provider established cholesterol goals  reviewed Counseled on importance of regular laboratory monitoring as prescribed Reviewed role and benefits of statin for ASCVD risk reduction Reviewed importance of limiting foods high in cholesterol  Hypertension Interventions:  (Status:  Goal on track:  Yes.) Long Term Goal Last practice recorded BP readings:  BP Readings from Last 3 Encounters:  03/08/22 136/60  03/04/22 (!) 158/68  03/02/22 130/68  Most recent eGFR/CrCl:  Lab Results  Component Value Date   EGFR 67 05/07/2021    No components found for: CRCL  Evaluation of current treatment plan related to hypertension self management and patient's adherence to plan as established by provider Provided education to patient re: stroke prevention, s/s of heart attack and stroke Reviewed medications with patient and discussed importance of compliance Discussed plans with patient for ongoing care management follow up and provided patient with direct contact information for care management team Advised patient, providing education and rationale, to monitor blood pressure daily and record, calling PCP for findings outside established parameters Provided education on prescribed diet low sodium heart healthy Reviewed to try checking B/P regularly at home. Reinforced to call provider if B/P is too low or high  Patient Goals/Self-Care Activities: Take all medications as prescribed Attend all scheduled provider appointments Call pharmacy for medication refills 3-7 days in advance of running out of medications Call provider office for new concerns or questions  call office if I gain more than 2 pounds in one day or 5 pounds in one week keep legs up while sitting track weight in diary watch for swelling in feet, ankles and legs every day weigh myself daily follow rescue plan if symptoms flare-up eat more whole grains, fruits and vegetables, lean meats and healthy fats identify and remove indoor air pollutants limit outdoor activity during  cold weather do breathing exercises every day follow rescue plan if symptoms flare-up get at least 7 to 8 hours of sleep at night use devices that will help like a cane, sock-puller or reacher do breathing exercises every day check pulse (heart) rate once a day make a plan to eat healthy keep all lab appointments check blood pressure 3 times per week take blood pressure log to all doctor appointments call doctor for signs and symptoms of high blood pressure take medications for blood pressure exactly as prescribed limit salt intake to less than 2078m/day call for medicine refill 2 or 3 days before it runs out take all medications exactly as prescribed call doctor with any symptoms you believe are related to your medicine  Follow Up Plan:  Telephone follow up appointment with care management team member scheduled for:  04/15/22 The patient has been provided with contact information for the care management team and has been advised to call with any  health related questions or concerns.       Plan:Telephone follow up appointment with care management team member scheduled for:  04/15/22 The patient has been provided with contact information for the care management team and has been advised to call with any health related questions or concerns.  Peter Garter RN, Jackquline Denmark, CDE Care Management Coordinator [facility] [phone]

## 2022-03-16 NOTE — Patient Instructions (Signed)
Visit Information  Thank you for taking time to visit with me today. Please don't hesitate to contact me if I can be of assistance to you before our next scheduled telephone appointment.  Following are the goals we discussed today:  Take all medications as prescribed Attend all scheduled provider appointments Call pharmacy for medication refills 3-7 days in advance of running out of medications Call provider office for new concerns or questions  call office if I gain more than 2 pounds in one day or 5 pounds in one week keep legs up while sitting track weight in diary watch for swelling in feet, ankles and legs every day weigh myself daily follow rescue plan if symptoms flare-up eat more whole grains, fruits and vegetables, lean meats and healthy fats identify and remove indoor air pollutants limit outdoor activity during cold weather do breathing exercises every day follow rescue plan if symptoms flare-up get at least 7 to 8 hours of sleep at night use devices that will help like a cane, sock-puller or reacher do breathing exercises every day check pulse (heart) rate once a day make a plan to eat healthy keep all lab appointments check blood pressure 3 times per week take blood pressure log to all doctor appointments call doctor for signs and symptoms of high blood pressure take medications for blood pressure exactly as prescribed limit salt intake to less than '2000mg'$ /day call for medicine refill 2 or 3 days before it runs out take all medications exactly as prescribed call doctor with any symptoms you believe are related to your medicine How to Use a Metered Dose Inhaler  A metered dose inhaler (MDI) is a handheld device filled with medicine that must be breathed into the lungs (inhaled). The medicine is delivered by pushing down on a metal canister. This releases a preset amount of spray and mist through the mouth and into the lungs. Each MDI canister holds a certain number of  doses (puffs). Using a spacer with a metered dose inhaler may be recommended to help get more medicine into the lungs. A spacer is a plastic tube that connects to the MDI on one end and has a mouthpiece on the other end. A spacer holds the medicine in the tube for a short time. This allows more medicine to be inhaled. The MDI can be used to deliver many kinds of inhaled medicines, including: Quick relief or rescue medicines, such as bronchodilators. Controller medicines, such as corticosteroids. What are the risks? If you do not use your inhaler correctly, medicine might not reach your lungs to help you breathe. If you do not have enough strength to push down the canister to make it spray, ask your health care provider for ways to help. The medicine in the MDI may cause side effects, such as: Mouth sores (thrush). Cough. Hoarseness. Shakiness. Headache. Supplies needed: A metered dose inhaler. A spacer, if recommended. How to use a metered dose inhaler without a spacer  Remove the cap from the inhaler. If you are using the inhaler for the first time, shake it for 5 seconds, turn it away from your face, then release 4 puffs into the air. This is called priming. Shake the inhaler for 5 seconds. Position the inhaler so the top of the canister faces up. Put your index finger on the top of the medicine canister. Support the bottom of the inhaler with your thumb. Breathe out normally and as completely as possible, away from the inhaler. Either place the inhaler between  your teeth and close your lips tightly around the mouthpiece, or hold the inhaler 1-2 inches (2.5-5 cm) away from your open mouth. Keep your tongue down out of the way. If you are unsure which technique to use, ask your health care provider. Press the canister down with your index finger to release the medicine. Inhale deeply and slowly through your mouth until your lungs are completely filled. Do not breathe in through your nose.  Inhaling should take 4-6 seconds. Hold the medicine in your lungs for 5-10 seconds (10 seconds is best). This helps the medicine get into the small airways of your lungs. Remove the inhaler from your mouth, turn your head, and breathe out normally. Wait about 1 minute between puffs or as directed. Then repeat steps 3-10 until you have taken the number of puffs that your health care provider directed. Put the cap on the inhaler. If you are using a steroid inhaler, rinse your mouth with water, gargle, and spit out the water. Do not swallow the water. How to use a metered dose inhaler with a spacer  Remove the cap from the inhaler. If you are using the inhaler for the first time, shake it for 5 seconds, turn it away from your face, then release 4 puffs into the air. This is called priming. Shake the inhaler for 5 seconds. Place the open end of the spacer onto the inhaler mouthpiece. Position the inhaler so the top of the canister faces up and the spacer mouthpiece faces you. Put your index finger on the top of the medicine canister. Support the bottom of the inhaler and the spacer with your thumb. Breathe out normally and as completely as possible, away from the spacer. Place the spacer between your teeth and close your lips tightly around it. Keep your tongue down out of the way. Press the canister down with your index finger to release the medicine, then inhale deeply and slowly through your mouth until your lungs are completely filled. Do not breathe in through your nose. Inhaling should take 4-6 seconds. Hold the medicine in your lungs for 5-10 seconds (10 seconds is best). This helps the medicine get into the small airways of your lungs. Remove the spacer from your mouth, turn your head, and breathe out normally. Wait about 1 minute between puffs or as directed. Then repeat steps 3-11 until you have taken the number of puffs that your health care provider directed. Remove the spacer from the  inhaler and put the cap on the inhaler. If you are using a steroid inhaler, rinse your mouth with water, gargle, and spit out the water. Do not swallow the water. Follow these instructions at home: Caring for your MDI Store your inhaler at or near room temperature. A cold MDI will not work properly. Follow directions on the package insert for care and cleaning of your MDI and spacer. General instructions Take your inhaled medicine only as told by your health care provider. Do not use the inhaler more than directed by your health care provider. Refill your MDI with medicine before all the preset doses have been used. If your inhaler has a counter, check it to determine how full your MDI is. The number you see tells you how many doses are left. If your inhaler does not have a counter, ask your health care provider when you will need to refill it. Then write the refill date on a calendar or on your MDI canister. Keep in mind that you cannot tell  when the medicine in an inhaler is empty by shaking it. You may feel or hear something in the canister even when the preset medicine doses have been used up. Keeping track of your dosages is important. Do not use any products that contain nicotine or tobacco, such as cigarettes, e-cigarettes, and chewing tobacco. If you need help quitting, ask your health care provider. Keep all follow-up visits as told by your health care provider. This is important. Where to find more information Centers for Disease Control and Prevention: http://www.wolf.info/ American Lung Association: www.lung.org Contact a health care provider if: Symptoms are only partially relieved with your inhaler. You are having trouble using your inhaler. You have side effects from the medicine. You have chills or a fever. You have night sweats. There is blood in your thick saliva (phlegm). Get help right away if: You have dizziness. You have a fast heart rate. You have severe shortness of  breath. You have difficulty breathing. These symptoms may represent a serious problem that is an emergency. Do not wait to see if the symptoms will go away. Get medical help right away. Call your local emergency services (911 in the [place] Do not drive yourself to the hospital. Summary A metered dose inhaler is a handheld device for taking medicine that must be breathed into the lungs (inhaled). Take your inhaled medicine only as told by your health care provider. Do not use the inhaler more than directed by your health care provider. You cannot tell when the medicine is gone in an inhaler by shaking it. Refill it with medicine before all the preset doses have been used. Follow directions on the package insert for care and cleaning of your MDI and spacer. This information is not intended to replace advice given to you by your health care provider. Make sure you discuss any questions you have with your health care provider. Document Revised: 10/30/2019 Document Reviewed: 10/30/2019 Elsevier Patient Education  [place] next appointment is by telephone on 04/15/22 at 1:15 PM  Please call the care guide team at [phone] if you need to cancel or reschedule your appointment.   If you are experiencing a Mental Health or [place] or need someone to talk to, please call the Suicide and Crisis Lifeline: 988 call the [place] National Suicide Prevention Lifeline: (701)122-2629 or TTY: (947)143-7777 TTY 606-749-2134) to talk to a trained counselor call 1-800-273-TALK (toll free, 24 hour hotline) go to [facility] [address], [place] [phone]) call 911   The patient verbalized understanding of instructions, educational materials, and care plan provided today and agreed to receive a mailed copy of patient instructions, educational materials, and care plan.  Peter Garter RN, Jackquline Denmark, CDE Care Management Coordinator   [facility] [phone]

## 2022-03-17 ENCOUNTER — Ambulatory Visit (INDEPENDENT_AMBULATORY_CARE_PROVIDER_SITE_OTHER): Payer: Medicare Other

## 2022-03-17 ENCOUNTER — Other Ambulatory Visit (INDEPENDENT_AMBULATORY_CARE_PROVIDER_SITE_OTHER): Payer: Medicare Other

## 2022-03-17 DIAGNOSIS — I10 Essential (primary) hypertension: Secondary | ICD-10-CM

## 2022-03-17 DIAGNOSIS — Z7901 Long term (current) use of anticoagulants: Secondary | ICD-10-CM | POA: Diagnosis not present

## 2022-03-17 LAB — BASIC METABOLIC PANEL
BUN: 11 mg/dL (ref 6–23)
CO2: 32 mEq/L (ref 19–32)
Calcium: 9.6 mg/dL (ref 8.4–10.5)
Chloride: 102 mEq/L (ref 96–112)
Creatinine, Ser: 1.09 mg/dL (ref 0.40–1.20)
GFR: 46.59 mL/min — ABNORMAL LOW (ref >60.00)
Glucose, Bld: 106 mg/dL — ABNORMAL HIGH (ref 70–99)
Potassium: 4.1 mEq/L (ref 3.5–5.1)
Sodium: 139 mEq/L (ref 135–145)

## 2022-03-17 LAB — POCT INR: INR: 1.9 — AB (ref 2.0–3.0)

## 2022-03-17 NOTE — Patient Instructions (Addendum)
Pre visit review using our clinic review tool, if applicable. No additional management support is needed unless otherwise documented below in the visit note.  Increase dose to take 1 1/2 tablets today and then change weekly dose to take 1 tablet daily except take 1/2 tablet on Sundays, Tuesdays and Fridays. Recheck in 3 week.

## 2022-03-17 NOTE — Progress Notes (Signed)
Increase dose to take 1 1/2 tablets today and then change weekly dose to take 1 tablet daily except take 1/2 tablet on Sundays, Tuesdays and Fridays. Recheck in 3 week.

## 2022-03-19 ENCOUNTER — Ambulatory Visit (HOSPITAL_BASED_OUTPATIENT_CLINIC_OR_DEPARTMENT_OTHER): Payer: Medicare Other | Admitting: Family

## 2022-03-19 ENCOUNTER — Encounter (HOSPITAL_BASED_OUTPATIENT_CLINIC_OR_DEPARTMENT_OTHER): Payer: Self-pay | Admitting: Family

## 2022-03-19 VITALS — BP 134/74 | HR 60 | Ht 63.0 in | Wt 145.0 lb

## 2022-03-19 DIAGNOSIS — D6859 Other primary thrombophilia: Secondary | ICD-10-CM

## 2022-03-19 DIAGNOSIS — Z953 Presence of xenogenic heart valve: Secondary | ICD-10-CM

## 2022-03-19 DIAGNOSIS — I5032 Chronic diastolic (congestive) heart failure: Secondary | ICD-10-CM | POA: Diagnosis not present

## 2022-03-19 DIAGNOSIS — E785 Hyperlipidemia, unspecified: Secondary | ICD-10-CM

## 2022-03-19 DIAGNOSIS — I10 Essential (primary) hypertension: Secondary | ICD-10-CM

## 2022-03-19 DIAGNOSIS — I4811 Longstanding persistent atrial fibrillation: Secondary | ICD-10-CM | POA: Diagnosis not present

## 2022-03-20 ENCOUNTER — Encounter (HOSPITAL_BASED_OUTPATIENT_CLINIC_OR_DEPARTMENT_OTHER): Payer: Self-pay | Admitting: Family

## 2022-03-22 ENCOUNTER — Telehealth (HOSPITAL_BASED_OUTPATIENT_CLINIC_OR_DEPARTMENT_OTHER): Payer: Self-pay

## 2022-03-22 NOTE — Telephone Encounter (Addendum)
Results called to patient who verbalizes understanding!     ----- Message from Chilton Si, MD sent at 03/22/2022  4:09 PM EDT ----- Kidney function and electrolytes are stable.

## 2022-03-26 ENCOUNTER — Telehealth: Payer: Self-pay

## 2022-03-26 DIAGNOSIS — E785 Hyperlipidemia, unspecified: Secondary | ICD-10-CM

## 2022-03-26 DIAGNOSIS — J449 Chronic obstructive pulmonary disease, unspecified: Secondary | ICD-10-CM | POA: Diagnosis not present

## 2022-03-26 DIAGNOSIS — I110 Hypertensive heart disease with heart failure: Secondary | ICD-10-CM | POA: Diagnosis not present

## 2022-03-26 DIAGNOSIS — I509 Heart failure, unspecified: Secondary | ICD-10-CM | POA: Diagnosis not present

## 2022-03-26 DIAGNOSIS — I4891 Unspecified atrial fibrillation: Secondary | ICD-10-CM

## 2022-03-26 NOTE — Telephone Encounter (Signed)
Called to discuss and explain PREP program; she is already doing Silver Sneakers, not sure she wants to attend at this point, feels like she is exercising enough on her own and watching her salt intake; also concerned about the distance with driving to [place]; advised if she changed her mind, to let us know.

## 2022-03-31 ENCOUNTER — Encounter (HOSPITAL_BASED_OUTPATIENT_CLINIC_OR_DEPARTMENT_OTHER): Payer: Self-pay | Admitting: Cardiovascular Disease

## 2022-04-02 ENCOUNTER — Telehealth: Payer: Self-pay | Admitting: Family Medicine

## 2022-04-02 NOTE — Telephone Encounter (Signed)
Pt is reporting bad bruises - she is on coumadin. She wants to be seen today and only with her MD.  Pt states MD told her that if she ever needed to be seen right away on any day, he could give her 10 minutes.  Please advise.  [phone]

## 2022-04-02 NOTE — Telephone Encounter (Signed)
PCP was informed of the message below.  Dr Elease Hashimoto approved a 15-minute appt on Monday as he does not have any openings today.  Spoke with the patient and offered an appt on 7/10.  Patient stated she hit her leg again, the skin is broken and she wanted to be seen today by Dr Elease Hashimoto as he knows her history/medication.  I advised the patient to go to an urgent care for evaluation as Dr Elease Hashimoto does not have any openings today and advised her any provider can help, even with her being on Coumadin.

## 2022-04-05 ENCOUNTER — Telehealth: Payer: Self-pay | Admitting: Cardiovascular Disease

## 2022-04-05 ENCOUNTER — Telehealth: Payer: Self-pay | Admitting: Pharmacist

## 2022-04-05 DIAGNOSIS — Z5181 Encounter for therapeutic drug level monitoring: Secondary | ICD-10-CM

## 2022-04-05 DIAGNOSIS — R609 Edema, unspecified: Secondary | ICD-10-CM

## 2022-04-05 DIAGNOSIS — I10 Essential (primary) hypertension: Secondary | ICD-10-CM

## 2022-04-05 DIAGNOSIS — I5032 Chronic diastolic (congestive) heart failure: Secondary | ICD-10-CM

## 2022-04-05 MED ORDER — BUMETANIDE 2 MG PO TABS: 2.0000 mg | ORAL_TABLET | Freq: Two times a day (BID) | ORAL | 3 refills | Status: DC

## 2022-04-05 MED ORDER — METOLAZONE 5 MG PO TABS: ORAL_TABLET | ORAL | 0 refills | Status: DC

## 2022-04-05 NOTE — Telephone Encounter (Addendum)
RN returned call to patient and provided the following recommendations,    "Please give her metolazone '5mg'$  x1 to take one hour before her next bumex dose.  Check BMP and BNP and later this week.   TCR "    Medications sent to pharmacy and patient to come for labs on wednesday or Thursday for repeat labs.

## 2022-04-05 NOTE — Chronic Care Management (AMB) (Signed)
    Chronic Care Management Pharmacy Assistant   Name: Catherine [name]  MRN: [id number] DOB: 03-08-1935  04/06/2022 APPOINTMENT REMINDER  [name] was reminded to have all medications, supplements and any blood glucose and blood pressure readings available for review with [name], Pharm. D, at her telephone visit on 04/06/2022 at 11:45.  Care Gaps: AWV - message sent to [name] Last BP - 134/74 on 03/19/2022 Covid booster - overdue  Star Rating Drug: Rosuvastatin '5mg'$  - last filled 02/04/2022 90 DS at [facility]  Any gaps in medications fill history? No  [name]  Catering manager [phone]

## 2022-04-05 NOTE — Telephone Encounter (Signed)
RN returned call to patient to discuss possible medication reactions and swelling.   Patient endorses that she does not feel good at all. One month prior started Entresto and Bumex, and did good for a while, but then her feet started to swell. States she is unable to really get her shoes on, even with compression hose nothing is getting better.   Patient has been checking her weight   7/1- 141 7/2-142 7/3-142 7/4-141 7/5-142 7/6-142 7/7-141 7/8-141 7/9-141 7/10- 142  One foot more swollen than the other, patient endorses she has a bruise from hitting her leg, but denies any changes to color, heat or pain.   Patient states she is due to refill her meds tomorrow and does not want to do so until she hears back from [name].

## 2022-04-05 NOTE — Progress Notes (Unsigned)
Chronic Care Management Pharmacy Note  04/06/2022 Name:  Catherine [name] MRN:  [id number] DOB:  07-01-1935  Summary: BP at goal < 140/90 per recent office readings and low at home  Pt reports swelling has improved  Recommendations/Changes made from today's visit: -Recommend decreasing or discontinuing doxazosin due to risk for edema and hypotension at home -Recommended bringing BP cuff to next office visit to ensure accuracy -Recommend switching Symbicort to LABA/LAMA   Plan: PAP for Entresto BP assessment in 1 month Follow up in 4 months    Subjective: Catherine [name] is an 87 y.o. year old female who is a primary patient of Burchette, Alinda Sierras, MD.  The CCM team was consulted for assistance with disease management and care coordination needs.    Engaged with patient by telephone for follow up visit in response to provider referral for pharmacy case management and/or care coordination services.   Consent to Services:  The patient was given information about Chronic Care Management services, agreed to services, and gave verbal consent prior to initiation of services.  Please see initial visit note for detailed documentation.   Patient Care Team: [name], MD as PCP - General (Family Medicine) [name], MD as PCP - Cardiology (Cardiology) Viona Gilmore, [facility] as Pharmacist (Pharmacist) Dimitri Ped, RN as Case Manager  Recent office visits: 03/17/22 [name], RN: Patient presented for anti-coag visit. INR 1.9, goal 2-3. Boosted dose x 1 day. Increased warfarin to 1 mg (2 mg x 0.5) every Sun, Tue, Fri; 2 mg (2 mg x 1) all other days. Follow up in 3 weeks.  03/08/22 [name] MD: Patient presented for bilateral back pain and fluid gain.  Patient got a prescription filled for Entresto and also budesonide 3 mg twice daily, which may have been filled in error in place of the Bumex.   02/24/22 [name] MD: Patient presented for back injury.  Prescribed tramadol PRN.  01/22/2022 [name] MD - Patient was seen for Acute on chronic combined systolic and diastolic congestive heart failure and additional issues. No mediation changes. No follow up noted.   12/21/2021 [name] MD - Patient was seen for bilateral leg edema and additional issues.No medication changes. Follow up in 1 month.  12/01/21 Harrell Gave, RN: Patient presented for RN CCM visit.  11/30/21 [name] MD: Patient presented for dysuria and fatigue. No medication changes.  11/16/21 [name] MD: Patient presented for hospital follow up. Prescribed bactroban. Follow up in 2 weeks.  Recent consult visits: 03/19/22 [name], NP (cardiology): Patient presented for Afib follow up. Referred to PREP.  03/04/22 [name], MD (cardiology): Patient presented for A fib follow up.  Switched torsemide to Bumex 2 mg BID. Follow up in 2 weeks.  03/02/22 Christinia Gully, MD (Pulmonary Disease): Patient presented for COPD GOLD II follow up. Continued Symbicort. Follow up in 3 months.  01/29/2022 [name] NP - Patient was seen for Chronic diastolic heart failure and additional issues. Started Amiodarone 200 mg 1 tablet twice daily for 14 days them once daily. Discontinued Robitussin DM, Guaifenesin, Mupirocin and Ondansetron. No follow up noted.   11/19/21 [name], MD (cardiology): Patient presented for mitral valve replacement follow up. Increased torsemide to 60 mg this afternoon and tomorrow morning. Take an extra potassium today and tomorrow.  10/06/21 Claudie Revering, PTA (outpatient rehab): Patient presented for PT treatment.  Hospital visits: Admitted to [facility] on 01/05/2022 due to Acute respiratory failure  with hypoxia  secondary to acute on chronic COPD and diastolic congestive heart failure exacerbation. Discharge date was 01/12/2022.  New?Medications Started at [facility] Discharge:?? benzonatate (Tessalon  Perles) guaiFENesin-dextromethorphan (ROBITUSSIN DM) losartan (COZAAR) predniSONE (DELTASONE) Medication Changes at Hospital Discharge: albuterol (VENTOLIN HFA) albuterol (PROVENTIL) metoprolol succinate (TOPROL-XL) Medications Discontinued at Hospital Discharge: No medications discontinued Medications that remain the same after Hospital Discharge:??  -All other medications will remain the same.    2/2-11/03/21 Patient admitted to [facility] for acute respiratory failure with hypoxia. Prescribed prednisone.  10/25/21 Patient presented to [place] ED for viral syndrome.   Objective:  Lab Results  Component Value Date   CREATININE 1.09 03/17/2022   BUN 11 03/17/2022   GFR 46.59 (L) 03/17/2022   GFRNONAA 58 (L) 01/18/2022   GFRAA 67 10/29/2020   NA 139 03/17/2022   K 4.1 03/17/2022   CALCIUM 9.6 03/17/2022   CO2 32 03/17/2022   GLUCOSE 106 (H) 03/17/2022    Lab Results  Component Value Date/Time   HGBA1C 5.3 10/12/2016 01:59 PM   GFR 46.59 (L) 03/17/2022 01:31 PM   GFR 62.02 12/21/2021 02:37 PM   MICROALBUR 1.6 12/21/2021 02:37 PM    Last diabetic Eye exam: No results found for: "HMDIABEYEEXA"  Last diabetic Foot exam: No results found for: "HMDIABFOOTEX"   Lab Results  Component Value Date   CHOL 146 05/22/2021   HDL 56 05/22/2021   LDLCALC 70 05/22/2021   LDLDIRECT 68.0 09/24/2019   TRIG 109 05/22/2021   CHOLHDL 2.6 05/22/2021       Latest Ref Rng & Units 01/05/2022    1:16 AM 12/21/2021    2:37 PM 10/29/2021    7:20 PM  Hepatic Function  Total Protein 6.5 - 8.1 g/dL 7.1  6.5  6.7   Albumin 3.5 - 5.0 g/dL 4.1  4.1  3.7   AST 15 - 41 U/L _0 ALT 0 - 44 U/L _1 Alk Phosphatase 38 - 126 U/L 87  63  71   Total Bilirubin 0.3 - 1.2 mg/dL 0.2  0.5  0.9     Lab Results  Component Value Date/Time   TSH 1.716 01/05/2022 10:41 AM   TSH 3.39 11/30/2021 11:36 AM   TSH 3.95 07/09/2020 11:35 AM   FREET4 0.81  03/03/2015 10:51 AM   FREET4 0.81 03/04/2014 11:01 AM       Latest Ref Rng & Units 01/18/2022    1:08 PM 01/12/2022   12:52 AM 01/11/2022    1:20 AM  CBC  WBC 4.0 - 10.5 K/uL 16.6  14.1  12.6   Hemoglobin 12.0 - 15.0 g/dL 15.1  13.6  12.9   Hematocrit 36.0 - 46.0 % 41.4  40.8  39.3   Platelets 150 - 400 K/uL 180  178  198     Lab Results  Component Value Date/Time   VD25OH 25.0 (L) 01/20/2021 12:09 PM   VD25OH 36 06/06/2012 11:09 AM    Clinical ASCVD: Yes  The ASCVD Risk score (Arnett DK, et al., 2019) failed to calculate for the following reasons:   The 2019 ASCVD risk score is only valid for ages 67 to 17       11/06/2021   11:01 AM 04/07/2021    9:09 AM 09/09/2020   11:22 AM  Depression screen PHQ 2/9  Decreased Interest 0 0 0  Down, Depressed, Hopeless 0 0 1  PHQ -  2 Score 0 0 1    CHA2DS2/VAS Stroke Risk Points  Current as of 10 minutes ago     6 >= 2 Points: High Risk  1 - 1.99 Points: Medium Risk  0 Points: Low Risk    Last Change: N/A      Details    This score determines the patient's risk of having a stroke if the  patient has atrial fibrillation.       Points Metrics  1 Has Congestive Heart Failure:  Yes    Current as of 10 minutes ago  1 Has Vascular Disease:  Yes    Current as of 10 minutes ago  1 Has Hypertension:  Yes    Current as of 10 minutes ago  2 Age:  30    Current as of 10 minutes ago  0 Has Diabetes:  No    Current as of 10 minutes ago  0 Had Stroke:  No  Had TIA:  No  Had Thromboembolism:  No    Current as of 10 minutes ago  1 Female:  Yes    Current as of 10 minutes ago         Social History   Tobacco Use  Smoking Status Former   Packs/day: 1.00   Years: 15.00   Total pack years: 15.00   Types: Cigarettes   Quit date: 09/28/1975   Years since quitting: 46.5  Smokeless Tobacco Never   BP Readings from Last 3 Encounters:  03/19/22 134/74  03/08/22 136/60  03/04/22 (!) 158/68   Pulse Readings from Last 3 Encounters:   03/19/22 60  03/08/22 (!) 50  03/04/22 (!) 47   Wt Readings from Last 3 Encounters:  03/19/22 145 lb (65.8 kg)  03/08/22 147 lb 8 oz (66.9 kg)  03/04/22 146 lb 3.2 oz (66.3 kg)   BMI Readings from Last 3 Encounters:  03/19/22 25.69 kg/m  03/08/22 26.13 kg/m  03/04/22 25.90 kg/m    Assessment/Interventions: Review of patient past medical history, allergies, medications, health status, including review of consultants reports, laboratory and other test data, was performed as part of comprehensive evaluation and provision of chronic care management services.   SDOH:  (Social Determinants of Health) assessments and interventions performed: Yes  SDOH Screenings   Alcohol Screen: Low Risk  (11/06/2021)   Alcohol Screen    Last Alcohol Screening Score (AUDIT): 0  Depression (PHQ2-9): Low Risk  (11/06/2021)   Depression (PHQ2-9)    PHQ-2 Score: 0  Financial Resource Strain: Low Risk  (11/06/2021)   Overall Financial Resource Strain (CARDIA)    Difficulty of Paying Living Expenses: Not hard at all  Food Insecurity: No Food Insecurity (11/06/2021)   Hunger Vital Sign    Worried About Running Out of Food in the Last Year: Never true    Ran Out of Food in the Last Year: Never true  Housing: Low Risk  (11/06/2021)   Housing    Last Housing Risk Score: 0  Physical Activity: Inactive (11/06/2021)   Exercise Vital Sign    Days of Exercise per Week: 0 days    Minutes of Exercise per Session: 0 min  Social Connections: Moderately Integrated (04/07/2021)   Social Connection and Isolation Panel [NHANES]    Frequency of Communication with Friends and Family: Twice a week    Frequency of Social Gatherings with Friends and Family: Twice a week    Attends Religious Services: More than 4 times per year    Active Member  of Clubs or Organizations: No    Attends Archivist Meetings: Never    Marital Status: Married  Stress: No Stress Concern Present (11/06/2021)   [place]    Feeling of Stress : Not at all  Tobacco Use: Medium Risk (03/31/2022)   Patient History    Smoking Tobacco Use: Former    Smokeless Tobacco Use: Never    Passive Exposure: Not on file  Transportation Needs: No Transportation Needs (11/06/2021)   PRAPARE - Hydrologist (Medical): No    Lack of Transportation (Non-Medical): No    CCM Care Plan  Allergies  Allergen Reactions   Aldactone [Spironolactone] Other (See Comments)    dyspnea   Amoxicillin Palpitations    Tachycardia Has patient had a PCN reaction causing immediate rash, facial/tongue/throat swelling, SOB or lightheadedness with hypotension: no Has patient had a PCN reaction causing severe rash involving mucus membranes or skin necrosis: {no Has patient had a PCN reaction that required hospitalization {no Has patient had a PCN reaction occurring within the last 10 years: {yes If all of the above answers are "NO", then may proceed with Cephalosporin use.   Diltiazem Other (See Comments)    Causing headaches    Flagyl [Metronidazole Hcl] Other (See Comments)    Causing headaches     Flovent [Fluticasone Propionate] Other (See Comments)    Leg cramps   Gabapentin Swelling   Lyrica [Pregabalin] Swelling   Quinidine Diarrhea and Other (See Comments)    Fever diarrhea   Simvastatin Other (See Comments)    Leg pain, myalgia   Tramadol Nausea Only   Verapamil Other (See Comments)    myalgias   Amlodipine     Low extremity edema   Crestor [Rosuvastatin]     myalgia   Livalo [Pitavastatin]     myalgias   Zetia [Ezetimibe]     LEG CRAMPS    Benazepril Hcl Cough   Ciprofloxacin Diarrhea   Codeine Nausea Only   Nitrofurantoin Monohyd Macro Nausea Only    Medications Reviewed Today     Reviewed by [name], MD (Physician) on 03/31/22 at 0755  Med List Status: <None>   Medication Order Taking? Sig Documenting Provider  Last Dose Status Informant  acetaminophen (TYLENOL) 500 MG tablet [id number] Yes Take 1,000 mg by mouth every 6 (six) hours as needed for mild pain. [provider] Taking Active Self, Pharmacy Records  albuterol (PROVENTIL) (2.5 MG/3ML) 0.083% nebulizer solution [id number] Yes Take 3 mLs (2.5 mg total) by nebulization every 4 (four) hours as needed for wheezing or shortness of breath. Caren Griffins, MD Taking Active   albuterol (VENTOLIN HFA) 108 (90 Base) MCG/ACT inhaler [id number] Yes INHALE 2 PUFFS INTO THE LUNGS EVERY 4 HOURS AS NEEDED FOR AHEEZING OR SHORTNESS OF Hulan Amato, MD Taking Active Self, Pharmacy Records  bumetanide [facility]) 2 MG tablet [id number] Yes Take 1 tablet (2 mg total) by mouth 2 (two) times daily. [name], MD  Active   cholecalciferol (VITAMIN D3) 25 MCG (1000 UNIT) tablet [id number] Yes Take 1,000 Units by mouth daily. [provider] Taking Active Self, Pharmacy Records  diazepam (VALIUM) 5 MG tablet [id number] Yes TAKE ONE TABLET AT BEDTIME [name], MD Taking Active Self, Pharmacy Records  doxazosin (CARDURA) 8 MG tablet [id number] Yes TAKE 1 TABLET DAILY [name], MD Taking Active Self, Pharmacy Records  metoprolol succinate (TOPROL-XL) 25 MG 24  hr tablet [id number] Yes Take 1 tablet (25 mg total) by mouth daily. Joette Catching, PA-C Taking Expired 03/19/22 2359   Multiple Vitamins-Minerals (HAIR/SKIN/NAILS/BIOTIN PO) [id number] Yes Take 1 tablet by mouth daily. [provider] Taking Active Self, Pharmacy Records  Multiple Vitamins-Minerals (MULTIVITAMIN ADULT Williemae Natter) CHEW [id number] Yes Chew 2 tablets by mouth daily. [provider] Taking Active Self, Pharmacy Records  potassium chloride (KLOR-CON) 10 MEQ tablet [id number] Yes Take 4 tablets (40 mEq total) by mouth 2 (two) times daily. Caren Griffins, MD Taking Active   rosuvastatin (CRESTOR) 5 MG tablet [id number] Yes Take one tablet every  Monday, wednesday Friday and Sunday [name], MD Taking Active   sacubitril-valsartan [facility]) 24-26 MG [id number] Yes Take 1 tablet by mouth 2 (two) times daily. [name], MD  Active   SYMBICORT 80-4.5 MCG/ACT inhaler [id number] Yes INHALE 2 PUFFS TWICE DAILY Tanda Rockers, MD Taking Active Self, Pharmacy Records  warfarin (COUMADIN) 2 MG tablet [id number] Yes TAKE ONE TABLET DAILY BY MOUTH EXCEPT TAKE 1/2 TABLET ON WEDNESDAYS AND SATURDAYS OR AS DIRECTED BY ANTICOAGULATION CLINIC [name], MD Taking Active Self, Pharmacy Records            Patient Active Problem List   Diagnosis Date Noted   COPD  GOLD 2 03/02/2022   Hypertension    Chronic kidney disease    Anxiety    Acute exacerbation of CHF (congestive heart failure) (HCC) 01/05/2022   Chest wall pain 11/02/2021   Nausea without vomiting 10/30/2021   Tricuspid regurgitation 07/14/2021   Protein-calorie malnutrition, severe 09/05/2019   Dehydration    Gastroenteritis    Enteritis 09/03/2019   COVID-19 08/27/2019   Dysuria 07/07/2019   Chronic anxiety 12/23/2017   Aortic stenosis, mild 07/26/2017   S/P Maze operation for atrial fibrillation 10/14/2016   S/P CABG x 1 10/14/2016   Cellulitis of left lower extremity 08/17/2016   Coronary artery disease    Chronic insomnia 07/12/2016   DOE (dyspnea on exertion) 03/26/2015   Warfarin-induced coagulopathy ([place]) 03/16/2015   Valvular heart disease 03/16/2015   Nephrolithiasis 03/16/2015   Acute respiratory failure with hypoxia  secondary to acute on chronic COPD and diastolic congestive heart failure exacerbation 03/11/2015   Essential hypertension    RHEUMATIC MITRAL STENOSIS 01/05/2008   Rheumatic heart disease 01/05/2008   Atrial fibrillation ([place]) 01/05/2008    Immunization History  Administered Date(s) Administered   Fluad Quad(high Dose 65+) 07/01/2020   Influenza Split 08/22/2011, 08/06/2012, 07/26/2013   Influenza, High Dose Seasonal  PF 09/17/2015, 07/12/2016, 08/01/2017   Influenza,inj,Quad PF,6+ Mos 06/21/2019   Influenza,inj,quad, With Preservative 08/28/2018, 06/21/2019   Influenza-Unspecified 09/18/2018, 09/22/2021   Moderna Sars-Covid-2 Vaccination 11/06/2019, 12/04/2019, 08/19/2020   Pneumococcal Conjugate-13 09/17/2015   Pneumococcal Polysaccharide-23 11/22/2011   Tdap 05/04/2012   Zoster Recombinat (Shingrix) 10/13/2017, 12/13/2017   Zoster, Live 08/22/2011   Patient inquired about stopping any medications as she feels like she is taking too many. Recommended looking for a new multivitamin with biotin in it to cut down on the amount of tablets she has to take.  Patient is having a lot of UTIs and has been on a low dose of cephalexin for a while to prevent this but they stopped it in the hospital. Patient has had a UTI since then and she feels like she has one again. She took AZO to help and it didn't help. Patient is wanting to have a referral to a urologist.   Patient has  been drinking cranberry juice and will try phenazopyridine.  Entresto PAP? Still filling losartan?  What inhalers has she tried before? Repeat spriometry  Been on LABA/LAMA before?  Upstream?  D/c doxazosin for risk of CHF exacerbations?  Patient is having a lot of swelling in her ankles. She has been doing some exercises in her bed. She feels so bad and wants to sit down and lay down. She dropped 3 lbs last night from the metolazone. Patient's weight went up to 142 and now its 139. She is really trying to work on her salt intake and is reading on the package. Patient is trying to aim for < 2000 mcg. Patient did have a hot dog last week.    Patient hit her leg on the same place twice and tore the skin and has never done this before.   Patient has neuropathy as well so this made things work.  BP Readings from Last 3 Encounters:  03/19/22 134/74  03/08/22 136/60  03/04/22 (!) 158/68   Patient is feeling dizzy and lightheaded some  but doesn't always check her.  Patient wants to feel good and wants to go back to silver sneakers and to be around people. Has not brought cuff into the office - arm cuff - doxazosin? Dr. Oval Linsey   Delene Loll? It is expensive at the pharmacy - [phone] for a months supply  -Symbicort is expensive too - they do file taxes   Patient reports that her breathing was pretty good until the fluid build up. She has oxygen too and very seldom uses the nebulizer. She can use the albuterol every 4 hours. She hasn't used the nebulizer in 2-3 weeks. She has never been on another inhaler.  Patient doesn't feel Dr. [name] is the best doctor with his mannerisms because he wants to do the talking.  Patient reports the cost of living just keeps going up.  Offered social work referral and patient declined for right now with everything going on.       Conditions to be addressed/monitored:  Hypertension, Hyperlipidemia, Atrial Fibrillation, Heart Failure, Coronary Artery Disease, COPD, and Anxiety  Conditions addressed this visit: COPD, hypertension, heart failure   Care Plan : CCM Pharmacy Care Plan  Updates made by Viona Gilmore, [place] since 04/06/2022 12:00 AM     Problem: Problem: Hypertension, Hyperlipidemia, Atrial Fibrillation, Heart Failure, Coronary Artery Disease, COPD, and Anxiety      Long-Range Goal: Patient-Specific Goal   Start Date: 06/17/2021  Expected End Date: 06/17/2022  Recent Progress: On track  Priority: High  Note:   Current Barriers:  Unable to independently monitor therapeutic efficacy  Pharmacist Clinical Goal(s):  Patient will achieve adherence to monitoring guidelines and medication adherence to achieve therapeutic efficacy through collaboration with PharmD and provider.   Interventions: 1:1 collaboration with [name], MD regarding development and update of comprehensive plan of care as evidenced by provider attestation and co-signature Inter-disciplinary care  team collaboration (see longitudinal plan of care) Comprehensive medication review performed; medication list updated in electronic medical record  Hypertension (BP goal <140/90) -Controlled -Current treatment: Doxazosin 8 mg daily  - in PM  - Query Appropriate, Effective, Query Safe, Accessible Bumetanide 2 mg 1 tablet twice daily  - Appropriate, Effective, Safe, Accessible Metoprolol XL 25 mg daily - in PM - Appropriate, Query effective, Safe, Accessible Entresto 24-26 mg 1 tablet twice daily - Appropriate, Effective, Safe, Query accessible -Medications previously tried: amlodipine, atenolol, irbesartan, losartan, maxzide, nebivolol, spironolactone  -Current home  readings: 116/50, 122/60, 110/65, 117/57, 120/55, 116/51 -Current dietary habits: tries to limit salt intake -Current exercise habits: limited in her ability -Denies hypotensive/hypertensive symptoms -Educated on BP goals and benefits of medications for prevention of heart attack, stroke and kidney damage; Importance of home blood pressure monitoring; Proper BP monitoring technique; Symptoms of hypotension and importance of maintaining adequate hydration; -Counseled to monitor BP at home at least weekly, document, and provide log at future appointments -Counseled on diet and exercise extensively Recommended to continue current medication Recommended discontinuing doxazosin due to hypotension and increased risk for fluid retention.  Hyperlipidemia: (LDL goal < 70) -Not ideally controlled -Current treatment: Rosuvastatin 5 mg 1 tablet 4 days a week - Appropriate, Query effective, Safe, Accessible -Medications previously tried: Livalo (cost),  Zetia (myalgias), simvastatin (myalgias) -Current dietary patterns: did not discuss -Current exercise habits: minimal -Educated on Cholesterol goals;  Importance of limiting foods high in cholesterol; Exercise goal of 150 minutes per week; -Counseled on diet and exercise  extensively Recommended to continue current medication  Atrial Fibrillation (Goal: prevent stroke and major bleeding) -Controlled -CHADSVASC: 6 -Current treatment: Rate control: Metoprolol XL 25 mg daily - Appropriate, Effective, Safe, Accessible Anticoagulation: warfarin 2 mg tablets as directed by coumadin clinic - Appropriate, Effective, Safe, Accessible -Medications previously tried: Amiodarone -Home BP and HR readings: refer to above  -Counseled on increased risk of stroke due to Afib and benefits of anticoagulation for stroke prevention; bleeding risk associated with warfarin and importance of self-monitoring for signs/symptoms of bleeding; avoidance of NSAIDs due to increased bleeding risk with anticoagulants; -Recommended to continue current medication  Heart Failure (Goal: manage symptoms and prevent exacerbations) -Controlled -Last ejection fraction: 60-65% (Date: 10/15/19) -HF type: Diastolic -NYHA Class: II (slight limitation of activity) -AHA HF Stage: B (Heart disease present - no symptoms present) -Current treatment: Bumetanide 2 mg  1 tablet twice daily - Appropriate, Query effective, Safe, Accessible Metoprolol XL 25 mg daily  - Appropriate, Effective, Safe, Accessible Potassium chloride 10 mEq 8 tablets daily - Appropriate, Effective, Safe, Accessible Entresto 24-36 mg 1 tablet twice daily - Appropriate, Effective, Safe, Query accessible Metolazone  2.5 mg 1 dose as needed - Appropriate, Effective, Safe, Accessible -Medications previously tried: none  -Current home BP/HR readings: refer to above -Current dietary habits: limits salt intake; patient is closely monitoring sodium intake and is down to < 2000 mcg per day -Current exercise habits: minimal -Educated on Importance of weighing daily; if you gain more than 3 pounds in one day or 5 pounds in one week, call cardiologist. Proper diuretic administration and potassium supplementation Importance of blood pressure  control -Counseled on diet and exercise extensively Recommended to continue current medication  COPD (Goal: control symptoms and prevent exacerbations) -Controlled -Current treatment  Albuterol HFA 108 mcg/act as needed - Appropriate, Effective, Safe, Accessible Symbicort 80-4.5 mcg/act 2 puff twice daily - Query Appropriate, Query effective, Safe, Accessible -Medications previously tried: none  -Gold Grade: Gold 2 (FEV1 50-79%) -Current COPD Classification:  C (low sx, >/=2 exacerbations/yr) -MMRC/CAT score: n/a -Pulmonary function testing: 2019 -Exacerbations requiring treatment in last 6 months: yes -Patient denies consistent use of maintenance inhaler -Frequency of rescue inhaler use: sometimes once a week -Counseled on Proper inhaler technique; Benefits of consistent maintenance inhaler use When to use rescue inhaler Differences between maintenance and rescue inhalers -Recommended to continue current medication Recommended switching Symbicort to LAMA/LABA inhaler due to COPD.  Anxiety (Goal: minimize symptoms) -Not ideally controlled -Current treatment: Diazepam 5 mg at bedtime -  Appropriate, Effective, Query Safe, Accessible -Medications previously tried/failed: n/a -PHQ9: 0 -GAD7: n/a -Educated on Benefits of medication for symptom control Benefits of cognitive-behavioral therapy with or without medication -Recommended to continue current medication   Health Maintenance -Vaccine gaps: COVID booster, influenza -Current therapy:  Fluconazole 150 mg  Multivitamin daily (Vitafusion Multivite Gummies)  Hair skin and nails multivitamin daily Vitamin D 5000 units daily Ondansetron ODT 4 mg q8hr PRN  Magni-Life Topical Cream  Potassium 10 mEq 6 tablets daily (8 tablets twice daily)  -Educated on Cost vs benefit of each product must be carefully weighed by individual consumer -Patient is satisfied with current therapy and denies issues -Recommended to continue current  medication  Patient Goals/Self-Care Activities Patient will:  - take medications as prescribed check blood pressure weekly, document, and provide at future appointments target a minimum of 150 minutes of moderate intensity exercise weekly  Follow Up Plan: Telephone follow up appointment with care management team member scheduled for: 4 months       Medication Assistance: None required.  Patient affirms current coverage meets needs.  Compliance/Adherence/Medication fill history: Care Gaps: COVID booster 134/74 on 03/19/2022  Star-Rating Drugs: Rosuvastatin 46m - [facility] Patient's preferred pharmacy is:  [place] [address]: [phone]: [address]Phone: [phone]: [phone]  Uses pill box? Yes Pt endorses 99% compliance  We discussed: Current pharmacy is preferred with insurance plan and patient is satisfied with pharmacy services Patient decided to: Continue current medication management strategy  Care Plan and Follow Up Patient Decision:  Patient agrees to Care Plan and Follow-up.  Plan: Telephone follow up appointment with care management team member scheduled for:  4 months  MJeni Salles PharmD, BWest Puente ValleyPharmacist LCoatsburgat [phone]

## 2022-04-05 NOTE — Telephone Encounter (Signed)
Pt c/o medication issue:  1. Name of Medication: bumetanide (BUMEX) 2 MG tablet sacubitril-valsartan (ENTRESTO) 24-26 MG  2. How are you currently taking this medication (dosage and times per day)? Take 1 tablet (2 mg total) by mouth 2 (two) times daily. Take 1 tablet by mouth 2 (two) times daily.  3. Are you having a reaction (difficulty breathing--STAT)?   4. What is your medication issue?  She states that one of these medication is causing for her legs to swell.

## 2022-04-06 ENCOUNTER — Ambulatory Visit (INDEPENDENT_AMBULATORY_CARE_PROVIDER_SITE_OTHER): Payer: Medicare Other | Admitting: Pharmacist

## 2022-04-06 DIAGNOSIS — J441 Chronic obstructive pulmonary disease with (acute) exacerbation: Secondary | ICD-10-CM

## 2022-04-06 DIAGNOSIS — I10 Essential (primary) hypertension: Secondary | ICD-10-CM

## 2022-04-07 ENCOUNTER — Ambulatory Visit (INDEPENDENT_AMBULATORY_CARE_PROVIDER_SITE_OTHER): Payer: Medicare Other

## 2022-04-07 ENCOUNTER — Telehealth: Payer: Self-pay | Admitting: Pharmacist

## 2022-04-07 ENCOUNTER — Encounter: Payer: Self-pay | Admitting: Family Medicine

## 2022-04-07 ENCOUNTER — Ambulatory Visit (INDEPENDENT_AMBULATORY_CARE_PROVIDER_SITE_OTHER): Payer: Medicare Other | Admitting: Family Medicine

## 2022-04-07 VITALS — BP 100/60 | HR 75 | Temp 97.8°F | Ht 63.0 in | Wt 140.7 lb

## 2022-04-07 DIAGNOSIS — R609 Edema, unspecified: Secondary | ICD-10-CM | POA: Diagnosis not present

## 2022-04-07 DIAGNOSIS — Z5181 Encounter for therapeutic drug level monitoring: Secondary | ICD-10-CM | POA: Diagnosis not present

## 2022-04-07 DIAGNOSIS — S80812A Abrasion, left lower leg, initial encounter: Secondary | ICD-10-CM | POA: Diagnosis not present

## 2022-04-07 DIAGNOSIS — Z7901 Long term (current) use of anticoagulants: Secondary | ICD-10-CM | POA: Diagnosis not present

## 2022-04-07 DIAGNOSIS — I10 Essential (primary) hypertension: Secondary | ICD-10-CM | POA: Diagnosis not present

## 2022-04-07 DIAGNOSIS — I5032 Chronic diastolic (congestive) heart failure: Secondary | ICD-10-CM | POA: Diagnosis not present

## 2022-04-07 LAB — POCT INR: INR: 3.5 — AB (ref 2.0–3.0)

## 2022-04-07 NOTE — Patient Instructions (Addendum)
Pre visit review using our clinic review tool, if applicable. No additional management support is needed unless otherwise documented below in the visit note.  Hold dose today and then change weekly dose to take 1/2 tablet daily except take 1 tablet on Mondays, Thursdays, and Saturdays. Recheck in 3 week.

## 2022-04-07 NOTE — Chronic Care Management (AMB) (Signed)
    Chronic Care Management Pharmacy Assistant   Name: Catherine [name]  MRN: [id number] DOB: 22-Dec-1934  Patient assistance application for Entresto (Dr. [name]) completed. [name] Clinical Pharmacist to complete with patient.    [place] Pharmacist Assistant [phone]

## 2022-04-07 NOTE — Patient Instructions (Signed)
Keep cleaning wound with soap and water  Leave off the alcohol.    Watch for any signs of infection- redness, warmth, pain.

## 2022-04-07 NOTE — Addendum Note (Signed)
Addended by: Alvina Filbert B on: 04/07/2022 02:06 PM   Modules accepted: Orders

## 2022-04-07 NOTE — Progress Notes (Addendum)
Pt is unsure if she has been taking warfarin as advised at last apt. She will contact coumadin clinic tomorrow to report the dosing she has been taking since last apt.  Hold dose today and then change weekly dose to take 1/2 tablet daily except take 1 tablet on Mondays, Thursdays, and Saturdays. Recheck in 3 week.

## 2022-04-07 NOTE — Telephone Encounter (Signed)
Patient came by office to discuss labs Discussed swelling in feet, did improve after the Metolazone dose but came back Getting BMET/BNP today  Weight this am 137  Did have some shortness of breath this am, improved with her breathing treatments Discussed with Dr Oval Linsey and will await lab results

## 2022-04-07 NOTE — Progress Notes (Unsigned)
   Established Patient Office Visit  Subjective   Patient ID: Catherine [name], female    DOB: 1935-09-05  Age: 87 y.o. MRN: [id number]  Chief Complaint  Patient presents with   Leg Injury    Patient complains of left leg injury, x3 days    HPI  {History (Optional):23778} Seen to evaluate left leg injury.  This occurred a few days ago.  She accidentally hit this against the edge of a stool.  She has been cleaning this with Dial soap and also applying some topical alcohol.  She has been changing dressing daily.  No surrounding erythema.  No fever.  She is on Coumadin  Chronic bilateral leg edema.  She has history of chronic diastolic heart failure.  Recently changed to Bumex and also started [place] recently.  Still has some edema right lower extremity greater than left. Review of Systems  Constitutional:  Negative for chills and fever.      Objective:     BP 100/60 (BP Location: Left Arm, Patient Position: Sitting, Cuff Size: Normal)   Pulse 75   Temp 97.8 F (36.6 C) (Oral)   Ht '5\' 3"'$  (1.6 m)   Wt 140 lb 11.2 oz (63.8 kg)   SpO2 97%   BMI 24.92 kg/m  {Vitals History (Optional):23777}  Physical Exam Vitals reviewed.  Cardiovascular:     Rate and Rhythm: Normal rate.  Skin:    Comments: Left anterior leg reveals area approximately 3 x 3 cm of superficial abrasion.  No surrounding erythema.  No necrosis.  No warmth.  No purulent drainage.  No foul odor.  Neurological:     Mental Status: She is alert.      Results for orders placed or performed in visit on 04/07/22  POCT INR  Result Value Ref Range   INR 3.5 (A) 2.0 - 3.0    {Labs (Optional):23779}  The ASCVD Risk score (Arnett DK, et al., 2019) failed to calculate for the following reasons:   The 2019 ASCVD risk score is only valid for ages 58 to 28    Assessment & Plan:   Superficial wound/abrasion left anterior leg.  No signs of infection at this time.  Continue daily gentle cleaning with soap and water.   Leave off topical alcohol, peroxide, or any other topicals that could slow healing.  Watch closely for signs of secondary infection.  Elevate legs frequently  No follow-ups on file.    [name], MD

## 2022-04-08 LAB — BASIC METABOLIC PANEL
BUN/Creatinine Ratio: 14 (ref 12–28)
BUN: 18 mg/dL (ref 8–27)
CO2: 25 mmol/L (ref 20–29)
Calcium: 9.8 mg/dL (ref 8.7–10.3)
Chloride: 97 mmol/L (ref 96–106)
Creatinine, Ser: 1.28 mg/dL — ABNORMAL HIGH (ref 0.57–1.00)
Glucose: 103 mg/dL — ABNORMAL HIGH (ref 70–99)
Potassium: 3.2 mmol/L — ABNORMAL LOW (ref 3.5–5.2)
Sodium: 144 mmol/L (ref 134–144)
eGFR: 41 mL/min/{1.73_m2} — ABNORMAL LOW (ref >59)

## 2022-04-08 LAB — BRAIN NATRIURETIC PEPTIDE: BNP: 162.9 pg/mL — ABNORMAL HIGH (ref 0.0–100.0)

## 2022-04-09 ENCOUNTER — Telehealth: Payer: Self-pay | Admitting: Cardiovascular Disease

## 2022-04-09 ENCOUNTER — Ambulatory Visit (INDEPENDENT_AMBULATORY_CARE_PROVIDER_SITE_OTHER): Payer: Medicare Other

## 2022-04-09 VITALS — Ht 63.0 in | Wt 138.0 lb

## 2022-04-09 DIAGNOSIS — Z0000 Encounter for general adult medical examination without abnormal findings: Secondary | ICD-10-CM | POA: Diagnosis not present

## 2022-04-09 MED ORDER — METOLAZONE 5 MG PO TABS: ORAL_TABLET | ORAL | 0 refills | Status: DC

## 2022-04-09 NOTE — Progress Notes (Signed)
Subjective:   Catherine [name] is a 87 y.o. female who presents for Medicare Annual (Subsequent) preventive examination.  Review of Systems    Virtual Visit via Telephone Note  I connected with  Catherine [name] on 04/09/22 at 12:30 PM EDT by telephone and verified that I am speaking with the correct person using two identifiers.  Location: Patient: Home Provider: Office Persons participating in the virtual visit: patient/Nurse Health Advisor   I discussed the limitations, risks, security and privacy concerns of performing an evaluation and management service by telephone and the availability of in person appointments. The patient expressed understanding and agreed to proceed.  Interactive audio and video telecommunications were attempted between this nurse and patient, however failed, due to patient having technical difficulties OR patient did not have access to video capability.  We continued and completed visit with audio only.  Some vital signs may be absent or patient reported.   Catherine Peaches, LPN  Cardiac Risk Factors include: hypertension;Other (see comment)     Objective:    Today's Vitals   04/09/22 1237 04/09/22 1238  Weight: 138 lb (62.6 kg)   Height: '5\' 3"'$  (1.6 m)   PainSc:  0-No pain   Body mass index is 24.45 kg/m.     04/09/2022   12:47 PM 01/07/2022    4:00 AM 01/05/2022   12:57 AM 11/06/2021   11:03 AM 10/29/2021    5:48 PM 10/25/2021    1:01 PM 04/07/2021    9:08 AM  Advanced Directives  Does Patient Have a Medical Advance Directive? Yes Yes No Yes No Yes Yes  Type of Paramedic of [place];Living will Living will  [place];Living will  Living will [place];Living will  Does patient want to make changes to medical advance directive? No - Patient declined No - Patient declined  No - Patient declined  No - Patient declined   Copy of [place] in Chart? No - copy requested   No  - copy requested   No - copy requested  Would patient like information on creating a medical advance directive?     No - Patient declined No - Patient declined     Current Medications (verified) Outpatient Encounter Medications as of 04/09/2022  Medication Sig   acetaminophen (TYLENOL) 500 MG tablet Take 1,000 mg by mouth every 6 (six) hours as needed for mild pain.   albuterol (PROVENTIL) (2.5 MG/3ML) 0.083% nebulizer solution Take 3 mLs (2.5 mg total) by nebulization every 4 (four) hours as needed for wheezing or shortness of breath.   albuterol (VENTOLIN HFA) 108 (90 Base) MCG/ACT inhaler INHALE 2 PUFFS INTO THE LUNGS EVERY 4 HOURS AS NEEDED FOR AHEEZING OR SHORTNESS OF BREATH   bumetanide (BUMEX) 2 MG tablet Take 1 tablet (2 mg total) by mouth 2 (two) times daily.   cholecalciferol (VITAMIN D3) 25 MCG (1000 UNIT) tablet Take 1,000 Units by mouth daily.   diazepam (VALIUM) 5 MG tablet TAKE ONE TABLET AT BEDTIME   doxazosin (CARDURA) 8 MG tablet TAKE 1 TABLET DAILY   metolazone (ZAROXOLYN) 5 MG tablet Please take one '5mg'$  tablet 30 minutes prior to next bumex dose   metoprolol succinate (TOPROL-XL) 25 MG 24 hr tablet Take 1 tablet (25 mg total) by mouth daily.   Multiple Vitamins-Minerals (HAIR/SKIN/NAILS/BIOTIN PO) Take 1 tablet by mouth daily.   Multiple Vitamins-Minerals (MULTIVITAMIN ADULT EXTRA C) CHEW Chew 2 tablets by mouth daily.  potassium chloride (KLOR-CON) 10 MEQ tablet Take 4 tablets (40 mEq total) by mouth 2 (two) times daily.   rosuvastatin (CRESTOR) 5 MG tablet Take one tablet every Monday, wednesday Friday and Sunday   sacubitril-valsartan (ENTRESTO) 24-26 MG Take 1 tablet by mouth 2 (two) times daily.   SYMBICORT 80-4.5 MCG/ACT inhaler INHALE 2 PUFFS TWICE DAILY   warfarin (COUMADIN) 2 MG tablet TAKE ONE TABLET DAILY BY MOUTH EXCEPT TAKE 1/2 TABLET ON WEDNESDAYS AND SATURDAYS OR AS DIRECTED BY ANTICOAGULATION CLINIC   No facility-administered encounter medications on file  as of 04/09/2022.    Allergies (verified) Aldactone [spironolactone], Amoxicillin, Diltiazem, Flagyl [metronidazole hcl], Flovent [fluticasone propionate], Gabapentin, Lyrica [pregabalin], Quinidine, Simvastatin, Tramadol, Verapamil, Amlodipine, Crestor [rosuvastatin], Livalo [pitavastatin], Zetia [ezetimibe], Benazepril hcl, Ciprofloxacin, Codeine, and Nitrofurantoin monohyd macro   History: Past Medical History:  Diagnosis Date   Acute on chronic diastolic heart failure (HCC)    Aortic stenosis, mild 07/26/2017   Arthritis    Asthma    last attack 02/2015   Atrial fibrillation, chronic (HCC)    Cellulitis of left lower extremity 08/17/2016   Ulcer associated with severe venous insufficiency   Chronic anticoagulation    Chronic diastolic CHF (congestive heart failure) ([place])    Chronic kidney disease    "RIGHT MANY KIDNEY INFECTIONS AND STONES"   COPD (chronic obstructive pulmonary disease) ([place])    Coronary artery disease    Dizziness    H/O: rheumatic fever    Heart murmur    Hypertension    PONV (postoperative nausea and vomiting)    ' SOMETIMES', BUT NOT ALWAYS"   S/P Maze operation for atrial fibrillation 10/14/2016   Complete bilateral atrial lesion set using cryothermy and bipolar radiofrequency ablation - atrial appendage was not treated due to previous surgical procedure (open mitral commissurotomy)   S/P mitral valve replacement with bioprosthetic valve 10/14/2016   29 mm Medtronic Mosaic porcine bioprosthetic tissue valve   Tricuspid regurgitation 07/14/2021   UTI (urinary tract infection)    Valvular heart disease    Has mitral stenosis with prior mitral commissurotomy in 1970   Past Surgical History:  Procedure Laterality Date   ABDOMINAL HYSTERECTOMY  1983   endometriosis   APPENDECTOMY     BACK SURGERY     neurosurgery x2   CARDIAC CATHETERIZATION     CARDIAC CATHETERIZATION N/A 08/03/2016   Procedure: Right/Left Heart Cath and Coronary Angiography;  Surgeon:  Peter M Martinique, MD;  Location: [facility];  Service: Cardiovascular;  Laterality: N/A;   cataract surg     CHOLECYSTECTOMY N/A 05/30/2015   Procedure: LAPAROSCOPIC CHOLECYSTECTOMY WITH INTRAOPERATIVE CHOLANGIOGRAM;  Surgeon: Excell Seltzer, MD;  Location: [facility];  Service: General;  Laterality: N/A;   COLONOSCOPY     CORONARY ARTERY BYPASS GRAFT N/A 10/14/2016   Procedure: CORONARY ARTERY BYPASS GRAFTING (CABG);  Surgeon: Rexene Alberts, MD;  Location: [facility];  Service: Open Heart Surgery;  Laterality: N/A;   EYE SURGERY     MAZE N/A 10/14/2016   Procedure: MAZE;  Surgeon: Rexene Alberts, MD;  Location: [facility];  Service: Open Heart Surgery;  Laterality: N/A;   MITRAL VALVE REPLACEMENT N/A 10/14/2016   Procedure: REDO MITRAL VALVE REPLACEMENT (MVR);  Surgeon: Rexene Alberts, MD;  Location: [facility];  Service: Open Heart Surgery;  Laterality: N/A;   MITRAL VALVE SURGERY Left 1970   Open mitral commissurotomy via left thoracotomy approach   TEE WITHOUT CARDIOVERSION N/A 07/05/2016   Procedure: TRANSESOPHAGEAL ECHOCARDIOGRAM (  TEE);  Surgeon: [name], MD;  Location: [facility];  Service: Cardiovascular;  Laterality: N/A;   TEE WITHOUT CARDIOVERSION N/A 10/14/2016   Procedure: TRANSESOPHAGEAL ECHOCARDIOGRAM (TEE);  Surgeon: Rexene Alberts, MD;  Location: [facility];  Service: Open Heart Surgery;  Laterality: N/A;   Family History  Problem Relation Age of Onset   Leukemia Father    Breast cancer Neg Hx    Social History   Socioeconomic History   Marital status: Married    Spouse name: Not on file   Number of children: Not on file   Years of education: Not on file   Highest education level: Not on file  Occupational History   Not on file  Tobacco Use   Smoking status: Former    Packs/day: 1.00    Years: 15.00    Total pack years: 15.00    Types: Cigarettes    Quit date: 09/28/1975    Years since quitting: 46.5   Smokeless tobacco: Never  Vaping Use   Vaping Use: Never used   Substance and Sexual Activity   Alcohol use: No    Alcohol/week: 0.0 standard drinks of alcohol   Drug use: No   Sexual activity: Yes  Other Topics Concern   Not on file  Social History Narrative   Not on file   Social Determinants of Health   Financial Resource Strain: Low Risk  (04/09/2022)   Overall Financial Resource Strain (CARDIA)    Difficulty of Paying Living Expenses: Not hard at all  Food Insecurity: No Food Insecurity (04/09/2022)   Hunger Vital Sign    Worried About Running Out of Food in the Last Year: Never true    [place] in the Last Year: Never true  Transportation Needs: No Transportation Needs (04/09/2022)   PRAPARE - Hydrologist (Medical): No    Lack of Transportation (Non-Medical): No  Physical Activity: Insufficiently Active (04/09/2022)   Exercise Vital Sign    Days of Exercise per Week: 5 days    Minutes of Exercise per Session: 20 min  Stress: No Stress Concern Present (04/09/2022)   [place]    Feeling of Stress : Not at all  Social Connections: [place] (04/09/2022)   Social Connection and Isolation Panel [NHANES]    Frequency of Communication with Friends and Family: More than three times a week    Frequency of Social Gatherings with Friends and Family: More than three times a week    Attends Religious Services: More than 4 times per year    Active Member of [employer] or Organizations: Yes    Attends Music therapist: More than 4 times per year    Marital Status: Married    Tobacco Counseling Counseling given: Not Answered   Clinical Intake:  Pre-visit preparation completed: No  Diabetic?  No  Interpreter Needed?: No Activities of Daily Living    04/09/2022   12:44 PM 01/06/2022    5:00 AM  In your present state of health, do you have any difficulty performing the following activities:  Hearing? 0 0  Vision? 0 0   Difficulty concentrating or making decisions? 0 0  Walking or climbing stairs? 0 1  Dressing or bathing? 0 0  Doing errands, shopping? 0 0  Preparing Food and eating ? N   Using the Toilet? N   In the past six months, have you accidently leaked urine? N  Do you have problems with loss of bowel control? N   Managing your Medications? N   Managing your Finances? N   Housekeeping or managing your Housekeeping? N     Patient Care Team: [name], MD as PCP - General (Family Medicine) [name], MD as PCP - Cardiology (Cardiology) Viona Gilmore, [facility] as Pharmacist (Pharmacist) Dimitri Ped, RN as Case Manager  Indicate any recent Medical Services you may have received from other than [facility] providers in the past year (date may be approximate).     Assessment:   This is a routine wellness examination for Catherine [name].  Hearing/Vision screen Hearing Screening - Comments:: No difficulty hearing Vision Screening - Comments:: No vision difficulty. Followed by Dr Katy Fitch  Dietary issues and exercise activities discussed: Exercise limited by: None identified   Goals Addressed               This Visit's Progress     Increase physical activity (pt-stated)         Depression Screen    04/09/2022   12:42 PM 11/06/2021   11:01 AM 04/07/2021    9:09 AM 09/09/2020   11:22 AM 07/01/2020    3:10 PM 04/18/2019    2:12 PM 03/25/2017    3:03 PM  PHQ 2/9 Scores  PHQ - 2 Score 0 0 0 1 0 2 0  PHQ- 9 Score     '4 4 3    '$ Fall Risk    04/09/2022   12:46 PM 11/06/2021   11:00 AM 09/09/2020   11:22 AM 07/09/2020    1:15 PM 07/01/2020    3:05 PM  Fall Risk   Falls in the past year? 0 0 0 0 1  Number falls in past yr: 0 0 0 0 0  Injury with Fall? 0 0  0 0  Risk for fall due to : No Fall Risks Impaired mobility   Impaired mobility  Follow up  Education provided;Falls prevention discussed   Falls evaluation completed    FALL RISK PREVENTION PERTAINING TO THE HOME:  Any  stairs in or around the home? Yes  If so, are there any without handrails? No  Home free of loose throw rugs in walkways, pet beds, electrical cords, etc? Yes  Adequate lighting in your home to reduce risk of falls? Yes   ASSISTIVE DEVICES UTILIZED TO PREVENT FALLS:  Life alert? No  Use of a cane, walker or w/c? No  Grab bars in the bathroom? Yes Shower chair or bench in shower? Yes  Elevated toilet seat or a handicapped toilet? Yes   TIMED UP AND GO:  Was the test performed? No . Audio Visit  Cognitive Function:        04/09/2022   12:47 PM  6CIT Screen  What Year? 0 points  What month? 0 points  What time? 0 points  Count back from 20 0 points  Months in reverse 0 points  Repeat phrase 0 points  Total Score 0 points    Immunizations Immunization History  Administered Date(s) Administered   Fluad Quad(high Dose 65+) 07/01/2020   Influenza Split 08/22/2011, 08/06/2012, 07/26/2013   Influenza, High Dose Seasonal PF 09/17/2015, 07/12/2016, 08/01/2017   Influenza,inj,Quad PF,6+ Mos 06/21/2019   Influenza,inj,quad, With Preservative 08/28/2018, 06/21/2019   Influenza-Unspecified 09/18/2018, 09/22/2021   Moderna Sars-Covid-2 Vaccination 11/06/2019, 12/04/2019, 08/19/2020   Pneumococcal Conjugate-13 09/17/2015   Pneumococcal Polysaccharide-23 11/22/2011   Tdap 05/04/2012   Zoster Recombinat (Shingrix) 10/13/2017, 12/13/2017  Zoster, Live 08/22/2011      Flu Vaccine status: Up to date  Pneumococcal vaccine status: Up to date  Covid-19 vaccine status: Completed vaccines  Qualifies for Shingles Vaccine? Yes   Zostavax completed Yes   Shingrix Completed?: Yes  Screening Tests Health Maintenance  Topic Date Due   COVID-19 Vaccine (4 - Booster for Moderna series) 04/25/2022 (Originally 10/14/2020)   INFLUENZA VACCINE  04/27/2022   TETANUS/TDAP  05/04/2022   Pneumonia Vaccine 44+ Years old  Completed   DEXA SCAN  Completed   Zoster Vaccines- Shingrix  Completed    HPV VACCINES  Aged Out    Health Maintenance  There are no preventive care reminders to display for this patient.   Colorectal cancer screening: No longer required.   Mammogram status: No longer required due to Age.    Lung Cancer Screening: (Low Dose CT Chest recommended if Age 36-80 years, 30 pack-year currently smoking OR have quit w/in 15years.) does not qualify.     Additional Screening:  Hepatitis C Screening: does not qualify; Completed   Vision Screening: Recommended annual ophthalmology exams for early detection of glaucoma and other disorders of the eye. Is the patient up to date with their annual eye exam?  Yes  Who is the provider or what is the name of the office in which the patient attends annual eye exams? Dr Katy Fitch If pt is not established with a provider, would they like to be referred to a provider to establish care? No .   Dental Screening: Recommended annual dental exams for proper oral hygiene  Community Resource Referral / Chronic Care Management:  CRR required this visit?  No   CCM required this visit?  No      Plan:     I have personally reviewed and noted the following in the patient's chart:   Medical and social history Use of alcohol, tobacco or illicit drugs  Current medications and supplements including opioid prescriptions.  Functional ability and status Nutritional status Physical activity Advanced directives List of other physicians Hospitalizations, surgeries, and ER visits in previous 12 months Vitals Screenings to include cognitive, depression, and falls Referrals and appointments  In addition, I have reviewed and discussed with patient certain preventive protocols, quality metrics, and best practice recommendations. A written personalized care plan for preventive services as well as general preventive health recommendations were provided to patient.     Catherine Peaches, LPN   12/24/5186   Nurse Notes: None

## 2022-04-09 NOTE — Patient Instructions (Addendum)
Catherine [name] , Thank you for taking time to come for your Medicare Wellness Visit. I appreciate your ongoing commitment to your health goals. Please review the following plan we discussed and let me know if I can assist you in the future.   These are the goals we discussed:  Goals       Exercise 3x per week (30 min per time)      Increase physical activity (pt-stated)      LDL CALC < 70        This is a list of the screening recommended for you and due dates:  Health Maintenance  Topic Date Due   COVID-19 Vaccine (4 - Booster for Moderna series) 04/25/2022*   Flu Shot  04/27/2022   Tetanus Vaccine  05/04/2022   Pneumonia Vaccine  Completed   DEXA scan (bone density measurement)  Completed   Zoster (Shingles) Vaccine  Completed   HPV Vaccine  Aged Out  *Topic was postponed. The date shown is not the original due date.    Advanced directives: Yes  Conditions/risks identified: None  Next appointment: Follow up in one year for your annual wellness visit    Preventive Care 65 Years and Older, Female Preventive care refers to lifestyle choices and visits with your health care provider that can promote health and wellness. What does preventive care include? A yearly physical exam. This is also called an annual well check. Dental exams once or twice a year. Routine eye exams. Ask your health care provider how often you should have your eyes checked. Personal lifestyle choices, including: Daily care of your teeth and gums. Regular physical activity. Eating a healthy diet. Avoiding tobacco and drug use. Limiting alcohol use. Practicing safe sex. Taking low-dose aspirin every day. Taking vitamin and mineral supplements as recommended by your health care provider. What happens during an annual well check? The services and screenings done by your health care provider during your annual well check will depend on your age, overall health, lifestyle risk factors, and family history of  disease. Counseling  Your health care provider may ask you questions about your: Alcohol use. Tobacco use. Drug use. Emotional well-being. Home and relationship well-being. Sexual activity. Eating habits. History of falls. Memory and ability to understand (cognition). Work and work Statistician. Reproductive health. Screening  You may have the following tests or measurements: Height, weight, and BMI. Blood pressure. Lipid and cholesterol levels. These may be checked every 5 years, or more frequently if you are over 32 years old. Skin check. Lung cancer screening. You may have this screening every year starting at age 98 if you have a 30-pack-year history of smoking and currently smoke or have quit within the past 15 years. Fecal occult blood test (FOBT) of the stool. You may have this test every year starting at age 30. Flexible sigmoidoscopy or colonoscopy. You may have a sigmoidoscopy every 5 years or a colonoscopy every 10 years starting at age 40. Hepatitis C blood test. Hepatitis B blood test. Sexually transmitted disease (STD) testing. Diabetes screening. This is done by checking your blood sugar (glucose) after you have not eaten for a while (fasting). You may have this done every 1-3 years. Bone density scan. This is done to screen for osteoporosis. You may have this done starting at age 33. Mammogram. This may be done every 1-2 years. Talk to your health care provider about how often you should have regular mammograms. Talk with your health care provider about your test  results, treatment options, and if necessary, the need for more tests. Vaccines  Your health care provider may recommend certain vaccines, such as: Influenza vaccine. This is recommended every year. Tetanus, diphtheria, and acellular pertussis (Tdap, Td) vaccine. You may need a Td booster every 10 years. Zoster vaccine. You may need this after age 36. Pneumococcal 13-valent conjugate (PCV13) vaccine. One  dose is recommended after age 18. Pneumococcal polysaccharide (PPSV23) vaccine. One dose is recommended after age 60. Talk to your health care provider about which screenings and vaccines you need and how often you need them. This information is not intended to replace advice given to you by your health care provider. Make sure you discuss any questions you have with your health care provider. Document Released: 10/10/2015 Document Revised: 06/02/2016 Document Reviewed: 07/15/2015 Elsevier Interactive Patient Education  2017 [place] Prevention in the Home Falls can cause injuries. They can happen to people of all ages. There are many things you can do to make your home safe and to help prevent falls. What can I do on the outside of my home? Regularly fix the edges of walkways and driveways and fix any cracks. Remove anything that might make you trip as you walk through a door, such as a raised step or threshold. Trim any bushes or trees on the path to your home. Use bright outdoor lighting. Clear any walking paths of anything that might make someone trip, such as rocks or tools. Regularly check to see if handrails are loose or broken. Make sure that both sides of any steps have handrails. Any raised decks and porches should have guardrails on the edges. Have any leaves, snow, or ice cleared regularly. Use sand or salt on walking paths during winter. Clean up any spills in your garage right away. This includes oil or grease spills. What can I do in the bathroom? Use night lights. Install grab bars by the toilet and in the tub and shower. Do not use towel bars as grab bars. Use non-skid mats or decals in the tub or shower. If you need to sit down in the shower, use a plastic, non-slip stool. Keep the floor dry. Clean up any water that spills on the floor as soon as it happens. Remove soap buildup in the tub or shower regularly. Attach bath mats securely with double-sided  non-slip rug tape. Do not have throw rugs and other things on the floor that can make you trip. What can I do in the bedroom? Use night lights. Make sure that you have a light by your bed that is easy to reach. Do not use any sheets or blankets that are too big for your bed. They should not hang down onto the floor. Have a firm chair that has side arms. You can use this for support while you get dressed. Do not have throw rugs and other things on the floor that can make you trip. What can I do in the kitchen? Clean up any spills right away. Avoid walking on wet floors. Keep items that you use a lot in easy-to-reach places. If you need to reach something above you, use a strong step stool that has a grab bar. Keep electrical cords out of the way. Do not use floor polish or wax that makes floors slippery. If you must use wax, use non-skid floor wax. Do not have throw rugs and other things on the floor that can make you trip. What can I do with my stairs?  Do not leave any items on the stairs. Make sure that there are handrails on both sides of the stairs and use them. Fix handrails that are broken or loose. Make sure that handrails are as long as the stairways. Check any carpeting to make sure that it is firmly attached to the stairs. Fix any carpet that is loose or worn. Avoid having throw rugs at the top or bottom of the stairs. If you do have throw rugs, attach them to the floor with carpet tape. Make sure that you have a light switch at the top of the stairs and the bottom of the stairs. If you do not have them, ask someone to add them for you. What else can I do to help prevent falls? Wear shoes that: Do not have high heels. Have rubber bottoms. Are comfortable and fit you well. Are closed at the toe. Do not wear sandals. If you use a stepladder: Make sure that it is fully opened. Do not climb a closed stepladder. Make sure that both sides of the stepladder are locked into place. Ask  someone to hold it for you, if possible. Clearly mark and make sure that you can see: Any grab bars or handrails. First and last steps. Where the edge of each step is. Use tools that help you move around (mobility aids) if they are needed. These include: Canes. Walkers. Scooters. Crutches. Turn on the lights when you go into a dark area. Replace any light bulbs as soon as they burn out. Set up your furniture so you have a clear path. Avoid moving your furniture around. If any of your floors are uneven, fix them. If there are any pets around you, be aware of where they are. Review your medicines with your doctor. Some medicines can make you feel dizzy. This can increase your chance of falling. Ask your doctor what other things that you can do to help prevent falls. This information is not intended to replace advice given to you by your health care provider. Make sure you discuss any questions you have with your health care provider. Document Released: 07/10/2009 Document Revised: 02/19/2016 Document Reviewed: 10/18/2014 Elsevier Interactive Patient Education  2017 [employer].

## 2022-04-09 NOTE — Addendum Note (Signed)
Addended by: Gerald Stabs on: 04/09/2022 08:27 AM   Modules accepted: Orders

## 2022-04-09 NOTE — Telephone Encounter (Signed)
   Pt c/o medication issue:  1. Name of Medication: metolazone (ZAROXOLYN) 5 MG tablet  2. How are you currently taking this medication (dosage and times per day)? Please take one '5mg'$  tablet 30 minutes prior to next bumex dose  3. Are you having a reaction (difficulty breathing--STAT)? No   4. What is your medication issue? [facility] called, they would like to get clarification about this medication dosage. They said, to look for their pharmacist Suanne Marker

## 2022-04-09 NOTE — Telephone Encounter (Signed)
RN returned call to [facility] to clarify prescription. Rx to be filled.

## 2022-04-09 NOTE — Telephone Encounter (Signed)
RN called patient and provided the following recommendations, Rx sent to pharmacy and patient scheduled for 7/17 with [name], NP     "Increase potassium to 80 mEq x one day.  Take another metolazone today.  Can she be seen next week?  Hard to manage over MyChart.   TCR"

## 2022-04-11 NOTE — Progress Notes (Unsigned)
Office Visit    Patient Name: Catherine [name] Date of Encounter: 04/12/2022  PCP:  [name], MD   [place]  Cardiologist:  [name], MD  Advanced Practice Provider:  No care team member to display Electrophysiologist:  None   Chief Complaint    Catherine [name] is a 87 y.o. 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   Past Medical History    Past Medical History:  Diagnosis Date   Acute on chronic diastolic heart failure (HCC)    Aortic stenosis, mild 07/26/2017   Arthritis    Asthma    last attack 02/2015   Atrial fibrillation, chronic (HCC)    Cellulitis of left lower extremity 08/17/2016   Ulcer associated with severe venous insufficiency   Chronic anticoagulation    Chronic diastolic CHF (congestive heart failure) ([place])    Chronic kidney disease    "RIGHT MANY KIDNEY INFECTIONS AND STONES"   COPD (chronic obstructive pulmonary disease) (HCC)    Coronary artery disease    Dizziness    H/O: rheumatic fever    Heart murmur    Hypertension    PONV (postoperative nausea and vomiting)    ' SOMETIMES', BUT NOT ALWAYS"   S/P Maze operation for atrial fibrillation 10/14/2016   Complete bilateral atrial lesion set using cryothermy and bipolar radiofrequency ablation - atrial appendage was not treated due to previous surgical procedure (open mitral commissurotomy)   S/P mitral valve replacement with bioprosthetic valve 10/14/2016   29 mm Medtronic Mosaic porcine bioprosthetic tissue valve   Tricuspid regurgitation 07/14/2021   UTI (urinary tract infection)    Valvular heart disease    Has mitral stenosis with prior mitral commissurotomy in 1970   Past Surgical History:  Procedure Laterality  Date   ABDOMINAL HYSTERECTOMY  1983   endometriosis   APPENDECTOMY     BACK SURGERY     neurosurgery x2   CARDIAC CATHETERIZATION     CARDIAC CATHETERIZATION N/A 08/03/2016   Procedure: Right/Left Heart Cath and Coronary Angiography;  Surgeon: Peter M Martinique, MD;  Location: [facility];  Service: Cardiovascular;  Laterality: N/A;   cataract surg     CHOLECYSTECTOMY N/A 05/30/2015   Procedure: LAPAROSCOPIC CHOLECYSTECTOMY WITH INTRAOPERATIVE CHOLANGIOGRAM;  Surgeon: Excell Seltzer, MD;  Location: [facility];  Service: General;  Laterality: N/A;   COLONOSCOPY     CORONARY ARTERY BYPASS GRAFT N/A 10/14/2016   Procedure: CORONARY ARTERY BYPASS GRAFTING (CABG);  Surgeon: Rexene Alberts, MD;  Location: [place];  Service: Open Heart Surgery;  Laterality: N/A;   EYE SURGERY     MAZE N/A 10/14/2016   Procedure: MAZE;  Surgeon: Rexene Alberts, MD;  Location: [place];  Service: Open Heart Surgery;  Laterality: N/A;   MITRAL VALVE REPLACEMENT N/A 10/14/2016   Procedure: REDO MITRAL VALVE REPLACEMENT (MVR);  Surgeon: Rexene Alberts, MD;  Location: [place];  Service: Open Heart Surgery;  Laterality: N/A;   MITRAL VALVE SURGERY Left 1970   Open mitral commissurotomy via left thoracotomy approach   TEE WITHOUT CARDIOVERSION N/A 07/05/2016   Procedure: TRANSESOPHAGEAL ECHOCARDIOGRAM (TEE);  Surgeon: [name], MD;  Location: [place];  Service: Cardiovascular;  Laterality: N/A;   TEE WITHOUT CARDIOVERSION N/A 10/14/2016  Procedure: TRANSESOPHAGEAL ECHOCARDIOGRAM (TEE);  Surgeon: Rexene Alberts, MD;  Location: [place];  Service: Open Heart Surgery;  Laterality: N/A;   Allergies  Allergies  Allergen Reactions   Aldactone [Spironolactone] Other (See Comments)    dyspnea   Amoxicillin Palpitations    Tachycardia Has patient had a PCN reaction causing immediate rash, facial/tongue/throat swelling, SOB or lightheadedness with hypotension: no Has patient had a PCN reaction causing severe rash  involving mucus membranes or skin necrosis: {no Has patient had a PCN reaction that required hospitalization {no Has patient had a PCN reaction occurring within the last 10 years: {yes If all of the above answers are "NO", then may proceed with Cephalosporin use.   Diltiazem Other (See Comments)    Causing headaches    Flagyl [Metronidazole Hcl] Other (See Comments)    Causing headaches     Flovent [Fluticasone Propionate] Other (See Comments)    Leg cramps   Gabapentin Swelling   Lyrica [Pregabalin] Swelling   Quinidine Diarrhea and Other (See Comments)    Fever diarrhea   Simvastatin Other (See Comments)    Leg pain, myalgia   Tramadol Nausea Only   Verapamil Other (See Comments)    myalgias   Amlodipine     Low extremity edema   Crestor [Rosuvastatin]     myalgia   Livalo [Pitavastatin]     myalgias   Zetia [Ezetimibe]     LEG CRAMPS    Benazepril Hcl Cough   Ciprofloxacin Diarrhea   Codeine Nausea Only   Nitrofurantoin Monohyd Macro Nausea Only    History of Present Illness    Catherine [name] is a 87 y.o. 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   History of rheumatic MV disease and underwent mitral commissurotomy in the 1970s. 09/2017 she underwent bioprosthetic MVR, CABGx1, and bilateral MAZE procedure with Dr. Roxy Manns.  Echo 09/2019 LVEF 65-70%, mild LVH, no RWMA, bioprosthetic mitral valve functioning appropriately, mild AS, mild pulmonic regurgitation.   She has been evaluated by atrial fibrillation clinic for question of atrial fibrillation vs long PR which remained unclear. However, it was decided that NSR would not be possible to maintain if she were in atrial fib.   She has had persistent fatigue since her surgery. She was seen 11/2020 by Dr.  Oval Linsey with improvement in abdominal bloating after transition from Lasix to Torsemide. Her persistent LE edema was thought to be due to venous insufficiency. Unable to tolerate compression socks and declined referral to [facility].  Hospitalized 10/2021 with COPD exacerbation and HFpEF.  Echo 10/2071 LVEF 60 to 65%, RV mildly reduced, RVSP 58 mmHg, moderate BAE, normal-appearing bioprosthetic MVR, moderate TR.  She was admitted 01/05/22 for acute on chronic diastolic heart failure with acute hypoxic respiratory failure and COPD exacerbation.  She was diuresed with IV Lasix.  Started on low-dose losartan.  Episodes of atrial fibrillation VR during admission felt secondary to CHF exacerbations.  She developed bradycardia with heart rate in the 40s and pauses up to 2.4 seconds while on metoprolol XL 50 mg daily.  It was reduced to 37.5 mg daily.  Amiodarone was considered to reduce risk of intermittent RVR versus pacemaker for tachybradycardia syndrome.  She was trialed on amiodarone but did not tolerate.  At clinic 03/04/22 her Losartan and Torsemide were stopped.  She was started on Entresto and Bumex. She was seen by Dr. Elease Hashimoto 03/08/22 noted pharmacist had provided Budesonide rather than Bumetamide. It was corrected.  At follow-up 03/19/2022 she was down 1 pound and medications were continued.  She was referred to prep exercise program at the [facility] but declined to participate.  She has contacted the office 04/05/2022 noting swelling.  She was provided metolazone 5 mg 1 dose to take 1 hour prior to Bumex.  04/07/2022 BNP 162.9, creatinine 1.28, GFR 41, K3.2.  Potassium increased to 80 mill equivalent x1 day.  She was instructed to take an additional dose of metolazone.   Presents today for follow up with her husband. Her weight is down 8 pounds from clinic visit 3 weeks ago. Tells me her feet today look the best they have been in a couple weeks.Tells me the swelling was predominantly in the tops of her feet. Also some  mild swelling in her hands that has since resolved. Tells me when she took the Metolazone she did go to the bathroom a lot and felt it was effective. She is walking to the mailbox for exercise.  Taking her morning medications the best after breakfast at 10 AM and second dose of Entresto, Bumex at 5 PM.  Has had some difficulties with nausea and encouraged to continue to take her medication with food.  EKGs/Labs/Other Studies Reviewed:   The following studies were reviewed today:   Right/Left Cardiac Catheterization 08/03/2016: The left ventricular systolic function is normal. LV end diastolic pressure is normal. The left ventricular ejection fraction is 55-65% by visual estimate. There is no aortic valve stenosis. There is severe mitral valve stenosis. Prox Cx to Mid Cx lesion, 50 %stenosed. Mid RCA-2 lesion, 80 %stenosed. Mid RCA-1 lesion, 80 %stenosed. Hemodynamic findings consistent with mild pulmonary hypertension. LV end diastolic pressure is normal.   1. Coronary artery disease   - 50% mid LCx   - 80% sequential lesions in the mid RCA 2. Normal LV function 3. Severe mitral stenosis. MV gradient of 13 mm Hg. MVA 0.99 cm squared with index 0.57. 4. Moderate to severe mitral insufficiency 5. Mild pulmonary HTN. 6. Normal LV EDP 7. No significant AV gradient 8. Occluded right brachial artery.   Plan: surgical evaluation for MVR and CABG. _______________   Echocardiogram 10/15/2019: Impressions: 1. Left ventricular ejection fraction, by visual estimation, is 65 to  70%. The left ventricle has hyperdynamic function. Left ventricular septal  wall thickness was mildly increased. There is mildly increased left  ventricular hypertrophy.   2. The left ventricle has no regional wall motion abnormalities.   3. Global right ventricle has normal systolic function.The right  ventricular size is normal. No increase in right ventricular wall  thickness.   4. Left atrial size was  moderately dilated.   5. Right atrial size was moderately dilated.   6. The mitral valve has been repaired/replaced. No evidence of mitral  valve regurgitation. No evidence of mitral stenosis.   7. Patient has a 29 mm biprosthetic valve functioning normally with no  PVL The mitral struts protrude into the LVOT quite a bit likely  contributing to some of the gradient across the LVOT/AV. Creates a small  "neointimal LVOT" area.   8. The tricuspid valve is normal in structure.   9. The aortic valve is tricuspid. Aortic valve regurgitation is not  visualized. Mild aortic valve stenosis.  10. Pulmonic regurgitation is mild.  11. The pulmonic valve was grossly normal. Pulmonic valve  regurgitation is  mild.  12. Mildly elevated pulmonary artery systolic pressure. ___________________  Echo 10/30/21  1. Left ventricular ejection fraction, by estimation, is 60 to 65%. The  left ventricle has normal function. The left ventricle has no regional  wall motion abnormalities. Left ventricular diastolic parameters are  indeterminate.   2. Right ventricular systolic function is mildly reduced. The right  ventricular size is mildly enlarged. There is moderately elevated  pulmonary artery systolic pressure.   3. Left atrial size was moderately dilated.   4. Right atrial size was moderately dilated.   5. Normal appearing bioprosthetic MVR no PVL low mean gradient at HR 58  bpm Stent struts protrude into LVOT but no obvious sub valvular gradients  . The mitral valve has been repaired/replaced. No evidence of mitral valve  regurgitation. No evidence of  mitral stenosis.   6. Tricuspid valve regurgitation is moderate.   7. The aortic valve is tricuspid. There is moderate calcification of the  aortic valve. There is moderate thickening of the aortic valve. Aortic  valve regurgitation is not visualized. Mild aortic valve stenosis.   8. The inferior vena cava is normal in size with greater than 50%   respiratory variability, suggesting right atrial pressure of 3 mmHg.   EKG:  No EKG is ordered today.  Recent Labs: 01/05/2022: ALT 21; TSH 1.716 01/12/2022: Magnesium 2.4 01/18/2022: Hemoglobin 15.1; Platelets 180 04/07/2022: BNP 162.9; BUN 18; Creatinine, Ser 1.28; Potassium 3.2; Sodium 144  Recent Lipid Panel    Component Value Date/Time   CHOL 146 05/22/2021 0843   TRIG 109 05/22/2021 0843   HDL 56 05/22/2021 0843   CHOLHDL 2.6 05/22/2021 0843   CHOLHDL 2.8 07/09/2020 1403   VLDL 42.8 (H) 09/24/2019 1054   LDLCALC 70 05/22/2021 0843   LDLCALC 77 07/09/2020 1403   LDLDIRECT 68.0 09/24/2019 1054    Risk Assessment/Calculations:   CHA2DS2-VASc Score = 6  This indicates a 9.7% annual risk of stroke. The patient's score is based upon: CHF History: 1 HTN History: 1 Diabetes History: 0 Stroke History: 0 Vascular Disease History: 1 Age Score: 2 Gender Score: 1    Home Medications   Current Meds  Medication Sig   acetaminophen (TYLENOL) 500 MG tablet Take 1,000 mg by mouth every 6 (six) hours as needed for mild pain.   albuterol (PROVENTIL) (2.5 MG/3ML) 0.083% nebulizer solution Take 3 mLs (2.5 mg total) by nebulization every 4 (four) hours as needed for wheezing or shortness of breath.   albuterol (VENTOLIN HFA) 108 (90 Base) MCG/ACT inhaler INHALE 2 PUFFS INTO THE LUNGS EVERY 4 HOURS AS NEEDED FOR AHEEZING OR SHORTNESS OF BREATH   bumetanide (BUMEX) 2 MG tablet Take 1 tablet (2 mg total) by mouth 2 (two) times daily.   cholecalciferol (VITAMIN D3) 25 MCG (1000 UNIT) tablet Take 1,000 Units by mouth daily.   diazepam (VALIUM) 5 MG tablet TAKE ONE TABLET AT BEDTIME   doxazosin (CARDURA) 8 MG tablet TAKE 1 TABLET DAILY   Multiple Vitamins-Minerals (HAIR/SKIN/NAILS/BIOTIN PO) Take 1 tablet by mouth daily.   Multiple Vitamins-Minerals (MULTIVITAMIN ADULT EXTRA C) CHEW Chew 2 tablets by mouth daily.   potassium chloride (KLOR-CON) 10 MEQ tablet Take 4 tablets (40 mEq total) by  mouth 2 (two) times daily.   rosuvastatin (CRESTOR) 5 MG tablet Take one tablet every Monday, wednesday Friday and Sunday   sacubitril-valsartan (ENTRESTO) 24-26 MG Take 1 tablet by mouth 2 (two) times daily.   SYMBICORT 80-4.5 MCG/ACT inhaler INHALE 2  PUFFS TWICE DAILY   warfarin (COUMADIN) 2 MG tablet TAKE ONE TABLET DAILY BY MOUTH EXCEPT TAKE 1/2 TABLET ON WEDNESDAYS AND SATURDAYS OR AS DIRECTED BY ANTICOAGULATION CLINIC   [DISCONTINUED] metolazone (ZAROXOLYN) 5 MG tablet Please take one 70m tablet 30 minutes prior to next bumex dose     Review of Systems      All other systems reviewed and are otherwise negative except as noted above.  Physical Exam    VS:  BP 118/60   Pulse 61   Ht 5' 3"  (1.6 m)   Wt 137 lb (62.1 kg)   BMI 24.27 kg/m  , BMI Body mass index is 24.27 kg/m.  Wt Readings from Last 3 Encounters:  04/12/22 137 lb (62.1 kg)  04/09/22 138 lb (62.6 kg)  04/07/22 140 lb 11.2 oz (63.8 kg)    GEN: Well nourished, well developed, in no acute distress. HEENT: normal. Neck: Supple, no JVD, carotid bruits, or masses. Cardiac: IRIR, no  rubs, or gallops. Gr 2/6 systolic murmur. No clubbing, cyanosis, edema. Radials/PT 2+ and equal bilaterally.  Respiratory:  Respirations regular and unlabored, clear to auscultation bilaterally. GI: Soft, nontender, nondistended. MS: No deformity or atrophy. Skin: Warm and dry, no rash. LE with bilateral varicose veins.  Neuro:  Strength and sensation are intact. Psych: Normal affect.  Assessment & Plan    CAD s/p CAG - Stable with no anginal symptoms. No indication for ischemic evaluation.  GDMT includes statin, beta-blocker.  No aspirin due to chronic anticoagulation. Heart healthy diet and regular cardiovascular exercise encouraged.    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 Echo 10/30/21 normal appearing bioprosthetic MVR with no PVL.  Chronic anticoagulation / Chronic atrial  fibrillation - Rate controlled. Previously intolerant of amiodarone. Continue Metoprolol 21mQD. Previous bradycardia on higher doses. Continue warfarin due to CHA2DS2-VASc of at least 6 (agex2, gender, CHF, HTN, MI). Follows with coumadin clinic at her PCP. Denies bleeding complications.   Chronic diastolic heart failure -Weight down 8 pounds  from last clinic visit. Notes her edema is improving.  Continue Bumex 2 mg twice daily.  She had good diuretic response with metolazone we will add metolazone 5 mg once per week on Fridays 30 minutes prior to morning Bumex.  BMP, BNP, CBC, magnesium today.  Be met in 1 week.  Additional GDMT Entresto, Metoprolol. Could consider SGLT2i at follow up.  Deferred today to avoid multiple medication changes.  Prior intolerance to Spironolactone. Low sodium diet, fluid restriction <2L, and daily weights encouraged. Educated to contact our office for weight gain of 2 lbs overnight or 5 lbs in one week.   Aortic stenosis - Mild AS by echo 10/2021  Continue optimal BP and volume control.  Carotid artery stenosis - 02/2021 bilateral 1-39% stenosis. Continue optimal cholesterol control. No aspirin due to chronic anticoagulation. Repeat imaging as clinically indicated.  HTN -BP well controlled. Continue current antihypertensive regimen.    HLD -Continue Rosuvastatin.  Disposition: Follow up as scheduled with Dr. RaOval LinseySigned, CaLoel DubonnetNP 04/12/2022, 11:42 AM [place]

## 2022-04-12 ENCOUNTER — Encounter (HOSPITAL_BASED_OUTPATIENT_CLINIC_OR_DEPARTMENT_OTHER): Payer: Self-pay | Admitting: Family

## 2022-04-12 ENCOUNTER — Ambulatory Visit (HOSPITAL_BASED_OUTPATIENT_CLINIC_OR_DEPARTMENT_OTHER): Payer: Medicare Other | Admitting: Family

## 2022-04-12 VITALS — BP 118/60 | HR 61 | Ht 63.0 in | Wt 137.0 lb

## 2022-04-12 DIAGNOSIS — I5032 Chronic diastolic (congestive) heart failure: Secondary | ICD-10-CM

## 2022-04-12 DIAGNOSIS — E782 Mixed hyperlipidemia: Secondary | ICD-10-CM

## 2022-04-12 DIAGNOSIS — I4821 Permanent atrial fibrillation: Secondary | ICD-10-CM

## 2022-04-12 DIAGNOSIS — I6523 Occlusion and stenosis of bilateral carotid arteries: Secondary | ICD-10-CM | POA: Diagnosis not present

## 2022-04-12 DIAGNOSIS — I10 Essential (primary) hypertension: Secondary | ICD-10-CM | POA: Diagnosis not present

## 2022-04-12 DIAGNOSIS — I25118 Atherosclerotic heart disease of native coronary artery with other forms of angina pectoris: Secondary | ICD-10-CM | POA: Diagnosis not present

## 2022-04-12 DIAGNOSIS — I350 Nonrheumatic aortic (valve) stenosis: Secondary | ICD-10-CM

## 2022-04-12 DIAGNOSIS — D6859 Other primary thrombophilia: Secondary | ICD-10-CM

## 2022-04-12 DIAGNOSIS — E876 Hypokalemia: Secondary | ICD-10-CM

## 2022-04-12 MED ORDER — METOLAZONE 5 MG PO TABS: ORAL_TABLET | ORAL | 0 refills | Status: DC

## 2022-04-12 NOTE — Patient Instructions (Addendum)
Medication Instructions:  Your physician has recommended you make the following change in your medication:   START Metolazone '5mg'$  once per week on Fridays *take 30 minutes prior to your morning Bumex *for example, take Metolazone at 10 AM and your Bumex at 10:30 AM  CHANGE your second dose of Entresto to take at bedtime with your Valium.  CONTINUE Bumex twice per day around 10am and 5pm  *If you need a refill on your cardiac medications before your next appointment, please call your pharmacy*   Lab Work: Your physician recommends that you return for lab work today: BNP, BMP, magnesium, CBC  Your physician recommends that you return for lab work in 2 weeks for BMP.  If you have labs (blood work) drawn today and your tests are completely normal, you will receive your results only by: [place] (if you have MyChart) OR A paper copy in the mail If you have any lab test that is abnormal or we need to change your treatment, we will call you to review the results.   Testing/Procedures: None ordered today.   Follow-Up: At [facility], you and your health needs are our priority.  As part of our continuing mission to provide you with exceptional heart care, we have created designated Provider Care Teams.  These Care Teams include your primary Cardiologist (physician) and Advanced Practice Providers (APPs -  Physician Assistants and Nurse Practitioners) who all work together to provide you with the care you need, when you need it.  We recommend signing up for the patient portal called "MyChart".  Sign up information is provided on this After Visit Summary.  MyChart is used to connect with patients for Virtual Visits (Telemedicine).  Patients are able to view lab/test results, encounter notes, upcoming appointments, etc.  Non-urgent messages can be sent to your provider as well.   To learn more about what you can do with MyChart, go to [email or web address].    Your next appointment:    As scheduled with Dr. Oval Linsey  Other Instructions  To prevent or reduce lower extremity swelling: Eat a low salt diet. Salt makes the body hold onto extra fluid which causes swelling. Sit with legs elevated. For example, in the recliner or on an [place].  Wear knee-high compression stockings during the daytime. Ones labeled 15-20 mmHg provide good compression.   Heart Healthy Diet Recommendations: A low-salt diet is recommended. Meats should be grilled, baked, or boiled. Avoid fried foods. Focus on lean protein sources like fish or chicken with vegetables and fruits. The American Heart Association is a Microbiologist!  American Heart Association Diet and Lifeystyle Recommendations   Exercise recommendations: The American Heart Association recommends 150 minutes of moderate intensity exercise weekly. Try 30 minutes of moderate intensity exercise 4-5 times per week. This could include walking, jogging, or swimming.

## 2022-04-13 ENCOUNTER — Other Ambulatory Visit: Payer: Self-pay | Admitting: Internal Medicine

## 2022-04-13 DIAGNOSIS — J449 Chronic obstructive pulmonary disease, unspecified: Secondary | ICD-10-CM | POA: Diagnosis not present

## 2022-04-13 LAB — BASIC METABOLIC PANEL
BUN/Creatinine Ratio: 18 (ref 12–28)
BUN: 22 mg/dL (ref 8–27)
CO2: 29 mmol/L (ref 20–29)
Calcium: 9.7 mg/dL (ref 8.7–10.3)
Chloride: 96 mmol/L (ref 96–106)
Creatinine, Ser: 1.25 mg/dL — ABNORMAL HIGH (ref 0.57–1.00)
Glucose: 115 mg/dL — ABNORMAL HIGH (ref 70–99)
Potassium: 3.2 mmol/L — ABNORMAL LOW (ref 3.5–5.2)
Sodium: 145 mmol/L — ABNORMAL HIGH (ref 134–144)
eGFR: 43 mL/min/{1.73_m2} — ABNORMAL LOW (ref >59)

## 2022-04-13 MED ORDER — BEVESPI AEROSPHERE 9-4.8 MCG/ACT IN AERO: 2.0000 | INHALATION_SPRAY | Freq: Two times a day (BID) | RESPIRATORY_TRACT | 3 refills | Status: DC

## 2022-04-13 NOTE — Progress Notes (Unsigned)
Bevespi 2bid  90 days as requested

## 2022-04-14 ENCOUNTER — Telehealth: Payer: Self-pay | Admitting: Pharmacist

## 2022-04-14 ENCOUNTER — Other Ambulatory Visit (HOSPITAL_BASED_OUTPATIENT_CLINIC_OR_DEPARTMENT_OTHER): Payer: Self-pay

## 2022-04-14 DIAGNOSIS — Z79899 Other long term (current) drug therapy: Secondary | ICD-10-CM

## 2022-04-14 MED ORDER — POTASSIUM CHLORIDE ER 10 MEQ PO TBCR: 60.0000 meq | EXTENDED_RELEASE_TABLET | Freq: Two times a day (BID) | ORAL | Status: DC

## 2022-04-14 NOTE — Telephone Encounter (Signed)
Called patient about change in inhaler from Symbicort to [facility]. Patient just picked up a Symbicort inhaler yesterday but will continue using this and then start on Bevespi when she runs out. Patient is aware that the device is similar and will call if she has any questions. Patient was approved for assistance for Bevespi and should be getting a 3 months supply in a few weeks sent to her house.  Will follow up on Entresto and Bevespi PAP in 2 weeks.

## 2022-04-15 ENCOUNTER — Ambulatory Visit: Payer: Medicare Other

## 2022-04-15 DIAGNOSIS — I2510 Atherosclerotic heart disease of native coronary artery without angina pectoris: Secondary | ICD-10-CM

## 2022-04-15 DIAGNOSIS — I5033 Acute on chronic diastolic (congestive) heart failure: Secondary | ICD-10-CM

## 2022-04-15 DIAGNOSIS — J441 Chronic obstructive pulmonary disease with (acute) exacerbation: Secondary | ICD-10-CM

## 2022-04-15 DIAGNOSIS — I10 Essential (primary) hypertension: Secondary | ICD-10-CM

## 2022-04-15 NOTE — Chronic Care Management (AMB) (Signed)
Chronic Care Management   CCM RN Visit Note  04/15/2022 Name: Catherine [name] MRN: [id number] DOB: 1934/12/30  Subjective: Catherine [name] is a 87 y.o. year old female who is a primary care patient of Burchette, Alinda Sierras, MD. The care management team was consulted for assistance with disease management and care coordination needs.    Engaged with patient by telephone for follow up visit in response to provider referral for case management and/or care coordination services.   Consent to Services:  The patient was given information about Chronic Care Management services, agreed to services, and gave verbal consent prior to initiation of services.  Please see initial visit note for detailed documentation.   Patient agreed to services and verbal consent obtained.   Assessment: Review of patient past medical history, allergies, medications, health status, including review of consultants reports, laboratory and other test data, was performed as part of comprehensive evaluation and provision of chronic care management services.   SDOH (Social Determinants of Health) assessments and interventions performed:    CCM Care Plan  Allergies  Allergen Reactions   Aldactone [Spironolactone] Other (See Comments)    dyspnea   Amoxicillin Palpitations    Tachycardia Has patient had a PCN reaction causing immediate rash, facial/tongue/throat swelling, SOB or lightheadedness with hypotension: no Has patient had a PCN reaction causing severe rash involving mucus membranes or skin necrosis: {no Has patient had a PCN reaction that required hospitalization {no Has patient had a PCN reaction occurring within the last 10 years: {yes If all of the above answers are "NO", then may proceed with Cephalosporin use.   Diltiazem Other (See Comments)    Causing headaches    Flagyl [Metronidazole Hcl] Other (See Comments)    Causing headaches     Flovent [Fluticasone Propionate] Other (See Comments)    Leg cramps    Gabapentin Swelling   Lyrica [Pregabalin] Swelling   Quinidine Diarrhea and Other (See Comments)    Fever diarrhea   Simvastatin Other (See Comments)    Leg pain, myalgia   Tramadol Nausea Only   Verapamil Other (See Comments)    myalgias   Amlodipine     Low extremity edema   Crestor [Rosuvastatin]     myalgia   Livalo [Pitavastatin]     myalgias   Zetia [Ezetimibe]     LEG CRAMPS    Benazepril Hcl Cough   Ciprofloxacin Diarrhea   Codeine Nausea Only   Nitrofurantoin Monohyd Macro Nausea Only    Outpatient Encounter Medications as of 04/15/2022  Medication Sig   acetaminophen (TYLENOL) 500 MG tablet Take 1,000 mg by mouth every 6 (six) hours as needed for mild pain.   albuterol (PROVENTIL) (2.5 MG/3ML) 0.083% nebulizer solution Take 3 mLs (2.5 mg total) by nebulization every 4 (four) hours as needed for wheezing or shortness of breath.   albuterol (VENTOLIN HFA) 108 (90 Base) MCG/ACT inhaler INHALE 2 PUFFS INTO THE LUNGS EVERY 4 HOURS AS NEEDED FOR AHEEZING OR SHORTNESS OF BREATH   bumetanide (BUMEX) 2 MG tablet Take 1 tablet (2 mg total) by mouth 2 (two) times daily.   cholecalciferol (VITAMIN D3) 25 MCG (1000 UNIT) tablet Take 1,000 Units by mouth daily.   diazepam (VALIUM) 5 MG tablet TAKE ONE TABLET AT BEDTIME   doxazosin (CARDURA) 8 MG tablet TAKE 1 TABLET DAILY   Glycopyrrolate-Formoterol (BEVESPI AEROSPHERE) 9-4.8 MCG/ACT AERO Inhale 2 puffs into the lungs 2 (two) times daily.   metolazone (ZAROXOLYN) 5 MG tablet Take one  78m tablet 30 minutes prior to morning Bumex dose on Fridays.   metoprolol succinate (TOPROL-XL) 25 MG 24 hr tablet Take 1 tablet (25 mg total) by mouth daily.   Multiple Vitamins-Minerals (HAIR/SKIN/NAILS/BIOTIN PO) Take 1 tablet by mouth daily.   Multiple Vitamins-Minerals (MULTIVITAMIN ADULT EXTRA C) CHEW Chew 2 tablets by mouth daily.   potassium chloride (KLOR-CON) 10 MEQ tablet Take 6 tablets (60 mEq total) by mouth 2 (two) times daily.    rosuvastatin (CRESTOR) 5 MG tablet Take one tablet every Monday, wednesday Friday and Sunday   sacubitril-valsartan (ENTRESTO) 24-26 MG Take 1 tablet by mouth 2 (two) times daily.   SYMBICORT 80-4.5 MCG/ACT inhaler INHALE 2 PUFFS TWICE DAILY   warfarin (COUMADIN) 2 MG tablet TAKE ONE TABLET DAILY BY MOUTH EXCEPT TAKE 1/2 TABLET ON WEDNESDAYS AND SATURDAYS OR AS DIRECTED BY ANTICOAGULATION CLINIC   No facility-administered encounter medications on file as of 04/15/2022.    Patient Active Problem List   Diagnosis Date Noted   COPD  GOLD 2 03/02/2022   Hypertension    Chronic kidney disease    Anxiety    Acute exacerbation of CHF (congestive heart failure) ([facility] 01/05/2022   Chest wall pain 11/02/2021   Nausea without vomiting 10/30/2021   Tricuspid regurgitation 07/14/2021   Protein-calorie malnutrition, severe 09/05/2019   Dehydration    Gastroenteritis    Enteritis 09/03/2019   COVID-19 08/27/2019   Dysuria 07/07/2019   Chronic anxiety 12/23/2017   Aortic stenosis, mild 07/26/2017   S/P Maze operation for atrial fibrillation 10/14/2016   S/P CABG x 1 10/14/2016   Cellulitis of left lower extremity 08/17/2016   Coronary artery disease    Chronic insomnia 07/12/2016   DOE (dyspnea on exertion) 03/26/2015   Warfarin-induced coagulopathy ([facility] 03/16/2015   Valvular heart disease 03/16/2015   Nephrolithiasis 03/16/2015   Acute respiratory failure with hypoxia  secondary to acute on chronic COPD and diastolic congestive heart failure exacerbation 03/11/2015   Essential hypertension    RHEUMATIC MITRAL STENOSIS 01/05/2008   Rheumatic heart disease 01/05/2008   Atrial fibrillation ([facility] 01/05/2008    Conditions to be addressed/monitored:Atrial Fibrillation, CHF, CAD, HTN, HLD, and COPD  Care Plan : RN Care Manager Plan of Care  Updates made by SDimitri Ped RN since 04/15/2022 12:00 AM  Completed 04/15/2022   Problem: Chronic Disease Management and Care Coordination Needs  (CHF,COPD, Atrial Fib, HTN, HLD,CAD) Resolved 04/15/2022  Priority: High     Long-Range Goal: Establish Plan of Care for Chronic Disease Management Needs (CHF,COPD, Atrial Fib, HTN, HLD,CAD) Completed 04/15/2022  Start Date: 11/06/2021  Expected End Date: 11/06/2022  Priority: High  Note:   RNCM Case closed goals met Current Barriers:  Knowledge Deficits related to plan of care for management of Atrial Fibrillation, CHF, CAD, HTN, HLD, and COPD  Chronic Disease Management support and education needs related to Atrial Fibrillation, CHF, CAD, HTN, HLD, and COPD    States the swelling in her legs is much better since her medications were changed and she is taking the metolazone once a week  States she is weighting daily and her weight is ranging 137-139. Denies any chest pains or increase in swelling.  States she now walking [employer] daily.  States she is only having a little shortness of breath when walking   States she tries to not use salt and mostly cooks at home. States her back pain is much better. States she has been approved for assistance for her  Bevespi inhaler and has applied for help with the Entresto.  RNCM Clinical Goal(s):  Patient will verbalize understanding of plan for management of Atrial Fibrillation, CHF, CAD, HTN, HLD, and COPD as evidenced by voiced adherence to plan of care verbalize basic understanding of  Atrial Fibrillation, CHF, CAD, HTN, HLD, and COPD disease process and self health management plan as evidenced by voiced understanding and teach back take all medications exactly as prescribed and will call provider for medication related questions as evidenced by readings within limits, voiced adherence to plan of care attend all scheduled medical appointments: Coumadin clinic 04/28/22, Dr. [name] 06/01/22, Cardiology 06/15/22, CCM PharmD 05/28/22 Annual Wellness Visit 04/14/23 as evidenced by medical records demonstrate Improved adherence to prescribed treatment plan  for Atrial Fibrillation, CHF, CAD, HTN, HLD, and COPD as evidenced by readings within limits, voiced adherence to plan of care continue to work with RN Care Manager to address care management and care coordination needs related to  Atrial Fibrillation, CHF, CAD, HTN, HLD, and COPD as evidenced by adherence to CM Team Scheduled appointments work with pharmacist to address polypharmacy and adherence related toAtrial Fibrillation, CHF, CAD, HTN, HLD, and COPD as evidenced by review or EMR and patient or pharmacist report through collaboration with RN Care manager, provider, and care team.   Interventions: 1:1 collaboration with primary care provider regarding development and update of comprehensive plan of care as evidenced by provider attestation and co-signature Inter-disciplinary care team collaboration (see longitudinal plan of care) Evaluation of current treatment plan related to  self management and patient's adherence to plan as established by provider   AFIB Interventions: (Status:  Goal Met.) Long Term Goal   Counseled on increased risk of stroke due to Afib and benefits of anticoagulation for stroke prevention Reviewed importance of adherence to anticoagulant exactly as prescribed Counseled on bleeding risk associated with Coumadin and importance of self-monitoring for signs/symptoms of bleeding Counseled on importance of regular laboratory monitoring as prescribed Counseled on seeking medical attention after a head injury or if there is blood in the urine/stool  Reinforced to keep Coumadin check appointments Reinforced foods to avoid with Coumadin   CAD Interventions: (Status:  Goal Met.) Long Term Goal Assessed understanding of CAD diagnosis Medications reviewed including medications utilized in CAD treatment plan Provided education on importance of blood pressure control in management of CAD Provided education on Importance of limiting foods high in cholesterol Counseled on  importance of regular laboratory monitoring as prescribed Reviewed Importance of taking all medications as prescribed Reviewed Importance of attending all scheduled provider appointments Advised to report any changes in symptoms or exercise tolerance   Heart Failure Interventions:  (Status:  Goal Met.) Long Term Goal Basic overview and discussion of pathophysiology of Heart Failure reviewed Provided education on low sodium diet Reviewed Heart Failure Action Plan in depth and provided written copy Discussed importance of daily weight and advised patient to weigh and record daily Reviewed role of diuretics in prevention of fluid overload and management of heart failure; Discussed the importance of keeping all appointments with provider Reinforced to take metolazone, Bumex and Entresto as instructed. Reinforced to follow  low sodium diet and fluid restrictions. Reinforced s/sx of HF to call provider.  Reinforced to keep legs elevated when sitting  COPD Interventions:  (Status:  Goal Met.) Long Term Goal Provided patient with basic written and verbal COPD education on self care/management/and exacerbation prevention Advised patient to track and manage COPD triggers Provided instruction about proper use of medications  used for management of COPD including inhalers Advised patient to self assesses COPD action plan zone and make appointment with provider if in the yellow zone for 48 hours without improvement Provided education about and advised patient to utilize infection prevention strategies to reduce risk of respiratory infection Discussed the importance of adequate rest and management of fatigue with COPD  Reviewed use of Symbicort then Bevespi everyday and to use albuterol only as needed.  Reinforced to pace activity and to gradually increase as tolerated   Hyperlipidemia Interventions:  (Status:  Goal Met.) Long Term Goal Medication review performed; medication list updated in electronic  medical record.  Provider established cholesterol goals reviewed Counseled on importance of regular laboratory monitoring as prescribed Reviewed role and benefits of statin for ASCVD risk reduction Reviewed importance of limiting foods high in cholesterol  Hypertension Interventions:  (Status:  Goal Met.) Long Term Goal Last practice recorded BP readings:  BP Readings from Last 3 Encounters:  04/12/22 118/60  04/07/22 100/60  03/19/22 134/74  Most recent eGFR/CrCl:  Lab Results  Component Value Date   EGFR 67 05/07/2021    No components found for: CRCL  Evaluation of current treatment plan related to hypertension self management and patient's adherence to plan as established by provider Provided education to patient re: stroke prevention, s/s of heart attack and stroke Reviewed medications with patient and discussed importance of compliance Discussed plans with patient for ongoing care management follow up and provided patient with direct contact information for care management team Advised patient, providing education and rationale, to monitor blood pressure daily and record, calling PCP for findings outside established parameters Provided education on prescribed diet low sodium heart healthy Reviewed to continue walking daily. Reinforced to try checking B/P regularly at home. Reinforced to call provider if B/P is too low or high  Patient Goals/Self-Care Activities: Take all medications as prescribed Attend all scheduled provider appointments Call pharmacy for medication refills 3-7 days in advance of running out of medications Call provider office for new concerns or questions  call office if I gain more than 2 pounds in one day or 5 pounds in one week keep legs up while sitting track weight in diary watch for swelling in feet, ankles and legs every day weigh myself daily follow rescue plan if symptoms flare-up eat more whole grains, fruits and vegetables, lean meats and  healthy fats identify and remove indoor air pollutants limit outdoor activity during cold weather do breathing exercises every day follow rescue plan if symptoms flare-up get at least 7 to 8 hours of sleep at night use devices that will help like a cane, sock-puller or reacher do breathing exercises every day check pulse (heart) rate once a day make a plan to eat healthy keep all lab appointments check blood pressure 3 times per week take blood pressure log to all doctor appointments call doctor for signs and symptoms of high blood pressure take medications for blood pressure exactly as prescribed limit salt intake to less than 2039m/day call for medicine refill 2 or 3 days before it runs out take all medications exactly as prescribed call doctor with any symptoms you believe are related to your medicine  Follow Up Plan:  The patient has been provided with contact information for the care management team and has been advised to call with any health related questions or concerns.  No further follow up required: RNCM Case closed goals met       Plan:The patient has been provided  with contact information for the care management team and has been advised to call with any health related questions or concerns.  No further follow up required: RNCM Case closed goals met Peter Garter RN, [facility], CDE Care Management Coordinator [place] [phone]

## 2022-04-15 NOTE — Patient Instructions (Signed)
Visit Information RNCM Case closed goals met Thank you for allowing me to share the care management and care coordination services that are available to you as part of your health plan and services through your primary care provider and medical home. Please reach out to me at [phone] if the care management/care coordination team may be of assistance to you in the future.   Peter Garter RN, [facility], CDE Care Management Coordinator [facility] [phone]

## 2022-04-19 ENCOUNTER — Telehealth: Payer: Self-pay | Admitting: Cardiovascular Disease

## 2022-04-19 DIAGNOSIS — I4821 Permanent atrial fibrillation: Secondary | ICD-10-CM | POA: Diagnosis not present

## 2022-04-19 DIAGNOSIS — D6859 Other primary thrombophilia: Secondary | ICD-10-CM | POA: Diagnosis not present

## 2022-04-19 DIAGNOSIS — I5032 Chronic diastolic (congestive) heart failure: Secondary | ICD-10-CM | POA: Diagnosis not present

## 2022-04-19 DIAGNOSIS — E876 Hypokalemia: Secondary | ICD-10-CM | POA: Diagnosis not present

## 2022-04-19 NOTE — Telephone Encounter (Signed)
  Pt c/o medication issue:  1. Name of Medication:   sacubitril-valsartan (ENTRESTO) 24-26 MG    2. How are you currently taking this medication (dosage and times per day)? Take 1 tablet by mouth 2 (two) times daily.  3. Are you having a reaction (difficulty breathing--STAT)? No   4. What is your medication issue? [employer] pt assistance called, they received the pt assistance enrollment for the pt. She said, to complete they need a prescription for entresto . She gave fax#  [phone]

## 2022-04-19 NOTE — Telephone Encounter (Signed)
Waiting on patient to bring back signed forms, Will send prescription upon receiving paperwork

## 2022-04-20 ENCOUNTER — Telehealth: Payer: Medicare Other

## 2022-04-20 LAB — CBC
Hematocrit: 37.4 % (ref 34.0–46.6)
Hemoglobin: 12.6 g/dL (ref 11.1–15.9)
MCH: 30.4 pg (ref 26.6–33.0)
MCHC: 33.7 g/dL (ref 31.5–35.7)
MCV: 90 fL (ref 79–97)
Platelets: 185 10*3/uL (ref 150–450)
RBC: 4.14 x10E6/uL (ref 3.77–5.28)
RDW: 13.3 % (ref 11.7–15.4)
WBC: 7.2 10*3/uL (ref 3.4–10.8)

## 2022-04-20 LAB — BASIC METABOLIC PANEL
BUN/Creatinine Ratio: 16 (ref 12–28)
BUN: 19 mg/dL (ref 8–27)
CO2: 23 mmol/L (ref 20–29)
Calcium: 9.2 mg/dL (ref 8.7–10.3)
Chloride: 101 mmol/L (ref 96–106)
Creatinine, Ser: 1.18 mg/dL — ABNORMAL HIGH (ref 0.57–1.00)
Glucose: 67 mg/dL — ABNORMAL LOW (ref 70–99)
Potassium: 3.8 mmol/L (ref 3.5–5.2)
Sodium: 146 mmol/L — ABNORMAL HIGH (ref 134–144)
eGFR: 46 mL/min/{1.73_m2} — ABNORMAL LOW (ref >59)

## 2022-04-20 LAB — BRAIN NATRIURETIC PEPTIDE: BNP: 133.2 pg/mL — ABNORMAL HIGH (ref 0.0–100.0)

## 2022-04-20 LAB — MAGNESIUM: Magnesium: 1.7 mg/dL (ref 1.6–2.3)

## 2022-04-20 NOTE — Telephone Encounter (Signed)
Updated paperwork submitted, fax successful, received documentation patient approved and notified, pt. Notified via letter of approval.

## 2022-04-21 ENCOUNTER — Telehealth (HOSPITAL_BASED_OUTPATIENT_CLINIC_OR_DEPARTMENT_OTHER): Payer: Self-pay

## 2022-04-21 NOTE — Telephone Encounter (Addendum)
Called results to patient and left results on VM (ok per DPR), instructions left to call office back if patient has any questions!      ----- Message from Loel Dubonnet, NP sent at 04/20/2022  5:14 PM EDT ----- BNP with volume status improving. CBC with no evidence of anemia nor infection.  Normal magnesium, potassium. Kidney function improving.   Overall a good result! Continue current medications.

## 2022-04-23 ENCOUNTER — Telehealth (HOSPITAL_BASED_OUTPATIENT_CLINIC_OR_DEPARTMENT_OTHER): Payer: Self-pay | Admitting: Cardiovascular Disease

## 2022-04-23 NOTE — Progress Notes (Addendum)
Spoke with Beckie Busing at [employer] [facility]), Patient as been approved 04/19/2022 - 09/26/2022. It is pending shippment due to needing a prescription faxed to them.   Spoke with Artelia Laroche at [facility]), Patient has been approved, medication has been ordered and will ship today, it will take 10-14 days for shipping time and will be shipped to the patients home.   Spoke with Sunday Spillers at Dr. Gevena Barre office. Requested Dwan Bolt to be sent to [employer] for the patient assistance program, this has been approved it is pending the prescription to be completed.

## 2022-04-23 NOTE — Telephone Encounter (Signed)
*  STAT* If patient is at the pharmacy, call can be transferred to refill team.   1. Which medications need to be refilled? (please list name of each medication and dose if known)  a new  prescription Entresto  2. Which pharmacy/location (including street and city if local pharmacy) is medication to be sent to [employer] fax it pleas- Application is approved, but need a new prescriptiono   3. Do they need a 30 day or 90 day supply? 90 days and refills

## 2022-04-26 DIAGNOSIS — I5033 Acute on chronic diastolic (congestive) heart failure: Secondary | ICD-10-CM | POA: Diagnosis not present

## 2022-04-26 DIAGNOSIS — I2510 Atherosclerotic heart disease of native coronary artery without angina pectoris: Secondary | ICD-10-CM

## 2022-04-26 DIAGNOSIS — J441 Chronic obstructive pulmonary disease with (acute) exacerbation: Secondary | ICD-10-CM

## 2022-04-26 DIAGNOSIS — I10 Essential (primary) hypertension: Secondary | ICD-10-CM | POA: Diagnosis not present

## 2022-04-27 MED ORDER — ENTRESTO 24-26 MG PO TABS: 1.0000 | ORAL_TABLET | Freq: Two times a day (BID) | ORAL | 3 refills | Status: DC

## 2022-04-27 NOTE — Telephone Encounter (Signed)
Spoke with [employer] and Rx can be sent electronically to [facility], sent [employer]

## 2022-04-27 NOTE — Telephone Encounter (Signed)
Looks like this has already been done?

## 2022-04-28 ENCOUNTER — Other Ambulatory Visit: Payer: Self-pay | Admitting: Family Medicine

## 2022-04-28 ENCOUNTER — Ambulatory Visit (INDEPENDENT_AMBULATORY_CARE_PROVIDER_SITE_OTHER): Payer: Medicare Other

## 2022-04-28 DIAGNOSIS — Z7901 Long term (current) use of anticoagulants: Secondary | ICD-10-CM

## 2022-04-28 LAB — POCT INR: INR: 1.9 — AB (ref 2.0–3.0)

## 2022-04-28 MED ORDER — TRAMADOL HCL 50 MG PO TABS: 50.0000 mg | ORAL_TABLET | Freq: Four times a day (QID) | ORAL | 0 refills | Status: DC | PRN

## 2022-04-28 NOTE — Progress Notes (Signed)
Increase dose today to take 1 tablet and  then continue 1/2 tablet daily except take 1 tablet on Mondays, Thursdays, and Saturdays. Recheck in 3 week.

## 2022-04-28 NOTE — Patient Instructions (Addendum)
Pre visit review using our clinic review tool, if applicable. No additional management support is needed unless otherwise documented below in the visit note.  Increase dose today to take 1 tablet and  then continue 1/2 tablet daily except take 1 tablet on Mondays, Thursdays, and Saturdays. Recheck in 3 week.

## 2022-05-04 ENCOUNTER — Other Ambulatory Visit: Payer: Self-pay | Admitting: Internal Medicine

## 2022-05-04 ENCOUNTER — Other Ambulatory Visit: Payer: Self-pay | Admitting: Family Medicine

## 2022-05-04 DIAGNOSIS — I4891 Unspecified atrial fibrillation: Secondary | ICD-10-CM

## 2022-05-04 DIAGNOSIS — I4821 Permanent atrial fibrillation: Secondary | ICD-10-CM

## 2022-05-04 NOTE — Telephone Encounter (Signed)
Last refill Valium- 11/06/21---30 tabs, 5 refills Last OV---04/07/22   No future OV scheduled.   Patient is seen in Coumadin Clinic*

## 2022-05-05 ENCOUNTER — Telehealth: Payer: Self-pay

## 2022-05-05 NOTE — Telephone Encounter (Signed)
Back injection on 8/21. Pt will need to hold warfarin for 5 days. Cancelled apt on 8/23 for INR check. Advised pt to take last dose on 8/16 and a schedule for dosing would be mailed to her and if she did not receive it before 8/21 to contact coumadin clinic. Pt verbalized understanding.

## 2022-05-05 NOTE — Telephone Encounter (Signed)
8/16: Take last dose of warfarin until after procedure 8/17: No warfarin 8/18: No warfarin 8/19: No warfarin 8/20: No warfarin  8/21: Procedure day; No warfarin  8/22: Take 1 tablet coumadin 8/23: Take 1 tablet coumadin 8/24: Take 1 1/2 tablet coumadin 8/25: Resume normal dosing of taking 1/2 tablet daily except take 1 tablet on Mondays, Thursdays and Saturdays.   8/28: Recheck INR at appointment at 1:30   Placed copy of instructions in mail.

## 2022-05-07 ENCOUNTER — Telehealth: Payer: Self-pay | Admitting: Cardiovascular Disease

## 2022-05-07 DIAGNOSIS — E782 Mixed hyperlipidemia: Secondary | ICD-10-CM

## 2022-05-07 DIAGNOSIS — Z5181 Encounter for therapeutic drug level monitoring: Secondary | ICD-10-CM

## 2022-05-07 NOTE — Telephone Encounter (Signed)
RN spoke with [employer] and they are setting up her delivery, stated they attempted to contact the patient on Monday but no answer.   RN called patient to update her.    Patient endorses severe bilateral leg pain believed to be related to medication. RN advised patient to take a 2 week statin holiday to see if symptoms resolve. Will reach out to the patient in 2 weeks to see if symptoms are better

## 2022-05-07 NOTE — Telephone Encounter (Signed)
Pt c/o medication issue:  1. Name of Medication:   sacubitril-valsartan (ENTRESTO) 24-26 MG    2. How are you currently taking this medication (dosage and times per day)?  Take 1 tablet by mouth 2 (two) times daily. 3. Are you having a reaction (difficulty breathing--STAT)? No  4. What is your medication issue? Pt states that she hs yet to receive or hear anything regarding medication. She would like for nurse to callback regarding this matter. Please advise

## 2022-05-11 ENCOUNTER — Telehealth: Payer: Self-pay | Admitting: Cardiovascular Disease

## 2022-05-11 NOTE — Telephone Encounter (Signed)
Call and spoke with Catherine [name], advised pt that she will need to call patient assistance and update her address with them and they will be able to track her package, [employer] patient assistance number was given to the pt [phone]), advised pt if there is anything needed to be done on our end to give [name] a call back, pt voice understanding and thanks for the call.

## 2022-05-11 NOTE — Telephone Encounter (Signed)
Patient is called stating she was approved for the patient assistance program for [facility].  She states she got a voice mail message stating her parcel is being held up and she needs to up date her address.  She called stating she needs help with that.  She wants to know if her package of Delene Loll can be tracked.

## 2022-05-14 DIAGNOSIS — J449 Chronic obstructive pulmonary disease, unspecified: Secondary | ICD-10-CM | POA: Diagnosis not present

## 2022-05-17 DIAGNOSIS — M5416 Radiculopathy, lumbar region: Secondary | ICD-10-CM | POA: Diagnosis not present

## 2022-05-19 ENCOUNTER — Telehealth: Payer: Self-pay | Admitting: Pharmacist

## 2022-05-19 ENCOUNTER — Ambulatory Visit: Payer: Medicare Other

## 2022-05-19 NOTE — Chronic Care Management (AMB) (Signed)
    Chronic Care Management Pharmacy Assistant   Name: YULIA ULRICH  MRN: [id number] DOB: 11-18-1934  Reason for Encounter: Follow up Delene Loll patient assistance.   Spoke with Estill Bamberg at [employer], Patient has been approved for [employer]. Approval was completed 04/22/2022 through 09/26/2022.  Patients first shipment tracking shows Delene Loll was  received by patient on 05/12/2022. Verified with patient, she states she has received Entresto.   [facility] Pharmacist Assistant [phone]

## 2022-05-20 NOTE — Telephone Encounter (Signed)
1) Leg pain like she was having have improved since stopping Rosuvastatin   2) Stopped Doxazosin secondary to SBP being below 100 multiple readings  Have been off for about 2 weeks, SBP doing better, still getting some readings in the 90's but feeling much better of both Rosuvastatin and Doxazosin   She will continue to monitor blood pressure at home and call back if SBP goes above 130  Will forward to Dr Oval Linsey for review

## 2022-05-21 ENCOUNTER — Telehealth (HOSPITAL_BASED_OUTPATIENT_CLINIC_OR_DEPARTMENT_OTHER): Payer: Self-pay

## 2022-05-21 NOTE — Telephone Encounter (Addendum)
Returned call to patient to see how she is feeling after statin holiday. Patient states that her symptoms resolved after holding. Patient previously spoke with Alvina Filbert who advised her to extended the statin holiday until hearing from Dr. Oval Linsey or being seen at her upcoming appointment. Agree with plan and patient was very appreciative of the call.     ----- Message from Gerald Stabs, RN sent at 05/07/2022  3:39 PM EDT ----- 2 week check in to see if symptoms are better after statin holiday

## 2022-05-24 ENCOUNTER — Ambulatory Visit (INDEPENDENT_AMBULATORY_CARE_PROVIDER_SITE_OTHER): Payer: Medicare Other

## 2022-05-24 DIAGNOSIS — T45515A Adverse effect of anticoagulants, initial encounter: Secondary | ICD-10-CM | POA: Diagnosis not present

## 2022-05-24 DIAGNOSIS — D6832 Hemorrhagic disorder due to extrinsic circulating anticoagulants: Secondary | ICD-10-CM | POA: Diagnosis not present

## 2022-05-24 LAB — POCT INR: INR: 2.9 (ref 2.0–3.0)

## 2022-05-24 NOTE — Progress Notes (Addendum)
Continue 1/2 tablet daily except take 1 tablet on Mondays, Thursdays, and Saturdays. Recheck in 3 week.

## 2022-05-24 NOTE — Patient Instructions (Addendum)
Pre visit review using our clinic review tool, if applicable. No additional management support is needed unless otherwise documented below in the visit note.  Continue 1/2 tablet daily except take 1 tablet on Mondays, Thursdays, and Saturdays. Recheck in 5 week.

## 2022-05-25 NOTE — Telephone Encounter (Signed)
OK.  Please have her see PharmD lipid clinic.   OK to stay off doxazosin.    TCR   Discussed with patient  She would like to have labs checked again prior to seeing anyone else, preferably prior to follow up next month  Will discuss Pharm D visit at follow up with Dr Oval Linsey

## 2022-05-27 ENCOUNTER — Telehealth: Payer: Self-pay | Admitting: Pharmacist

## 2022-05-27 NOTE — Chronic Care Management (AMB) (Signed)
    Chronic Care Management Pharmacy Assistant   Name: Catherine [name]  MRN: [id number] DOB: [birth year]  05/28/2022 APPOINTMENT REMINDER  [name] was reminded to have all medications, supplements and any blood glucose and blood pressure readings available for review with [name], Pharm. D, at her telephone visit on 05/28/2022 at 9:30.  Care Gaps: AWV - scheduled 04/14/2023  Last BP - 118/60 on 04/12/2022 Covid booster - overdue Flu - due Tdap - overdue  Star Rating Drug: None  Any gaps in medications fill history? None  [place] Pharmacist Assistant [phone]

## 2022-05-28 ENCOUNTER — Other Ambulatory Visit (HOSPITAL_BASED_OUTPATIENT_CLINIC_OR_DEPARTMENT_OTHER): Payer: Self-pay | Admitting: Family

## 2022-05-28 ENCOUNTER — Ambulatory Visit (INDEPENDENT_AMBULATORY_CARE_PROVIDER_SITE_OTHER): Payer: Medicare Other | Admitting: Pharmacist

## 2022-05-28 ENCOUNTER — Ambulatory Visit (INDEPENDENT_AMBULATORY_CARE_PROVIDER_SITE_OTHER): Payer: Medicare Other | Admitting: Family Medicine

## 2022-05-28 ENCOUNTER — Encounter: Payer: Self-pay | Admitting: Family Medicine

## 2022-05-28 VITALS — BP 116/60 | HR 60 | Temp 98.0°F | Ht 63.0 in | Wt 135.6 lb

## 2022-05-28 DIAGNOSIS — B3731 Acute candidiasis of vulva and vagina: Secondary | ICD-10-CM

## 2022-05-28 DIAGNOSIS — I5032 Chronic diastolic (congestive) heart failure: Secondary | ICD-10-CM

## 2022-05-28 DIAGNOSIS — J441 Chronic obstructive pulmonary disease with (acute) exacerbation: Secondary | ICD-10-CM

## 2022-05-28 DIAGNOSIS — L989 Disorder of the skin and subcutaneous tissue, unspecified: Secondary | ICD-10-CM

## 2022-05-28 DIAGNOSIS — L821 Other seborrheic keratosis: Secondary | ICD-10-CM

## 2022-05-28 DIAGNOSIS — I5033 Acute on chronic diastolic (congestive) heart failure: Secondary | ICD-10-CM

## 2022-05-28 MED ORDER — FLUCONAZOLE 150 MG PO TABS: ORAL_TABLET | ORAL | 0 refills | Status: DC

## 2022-05-28 NOTE — Progress Notes (Signed)
Chronic Care Management Pharmacy Note  05/28/2022 Name:  Catherine [name] MRN:  [id number] DOB:  04-29-1935  Summary: BP at goal < 140/90 per recent office readings and is no longer having lows at home Pt reports breathing has improved Pt reports frequent urination at night  Recommendations/Changes made from today's visit: -Recommended bringing BP cuff to next office visit to ensure accuracy -Recommended Mucinex for congestion  -Recommend repeat spirometry -Recommended moving second dose of bumetanide to 2 pm   Plan: Follow up in 2 months  Subjective: Catherine [name] is [name] 87 y.o. year old female who is a primary patient of Catherine [name], Catherine Sierras, MD.  The CCM team was consulted for assistance with disease management and care coordination needs.    Engaged with patient by telephone for follow up visit in response to provider referral for pharmacy case management and/or care coordination services.   Consent to Services:  The patient was given information about Chronic Care Management services, agreed to services, and [name] verbal consent prior to initiation of services.  Please see initial visit note for detailed documentation.   Patient Care Team: Catherine Post, MD as PCP - General (Family Medicine) Catherine Latch, MD as PCP - Cardiology (Cardiology) Catherine [name], [facility] as Pharmacist (Pharmacist)  Recent office visits: 05/24/22 Catherine An, RN: Patient presented for anti-coag visit. INR 2.9, goal 2-3. Continued warfarin 2 mg (2 mg x 1) every Mon, Thu, Sat; 1 mg (2 mg x 0.5) all other days. Follow up in 3 weeks.  04/09/22 Catherine Arbour, LPN: Patient presented for AWV.  04/07/22 Catherine Littler MD: Patient presented for abrasion of left lower extremity.  03/08/22 Catherine Littler MD: Patient presented for bilateral back pain and fluid gain.  Patient got a prescription filled for Entresto and also budesonide 3 mg twice daily, which may have been filled in error in place of  the Bumex.   02/24/22 Catherine Littler MD: Patient presented for back injury. Prescribed tramadol PRN.  01/22/2022 Catherine Littler MD - Patient was seen for Acute on chronic combined systolic and diastolic congestive heart failure and additional issues. No mediation changes. No follow up noted.   12/21/2021 Catherine Littler MD - Patient was seen for bilateral leg edema and additional issues.No medication changes. Follow up in 1 month.  12/01/21 Catherine Gave, RN: Patient presented for RN CCM visit.  11/30/21 Catherine Littler MD: Patient presented for dysuria and fatigue. No medication changes.  Recent consult visits: 05/07/22 telephone encounter (Cardiology): Held rosuvastatin due to leg pain and doxazosin due to SBP < 100.   04/12/22 Catherine Shark, NP (cardiology): Patient presented for CAD and Afib follow up.  Prescribed metolazone once per week on Fridays. Continued Bumex dose. Lab in 2 weeks.  03/19/22 Catherine Montana, NP (cardiology): Patient presented for Afib follow up. Referred to PREP.  03/04/22 Catherine Latch, MD (cardiology): Patient presented for A fib follow up.  Switched torsemide to Bumex 2 mg BID. Follow up in 2 weeks.  03/02/22 Catherine Gully, MD (Pulmonary Disease): Patient presented for COPD GOLD II follow up. Continued Symbicort. Follow up in 3 months.  01/29/2022 Catherine Montana NP - Patient was seen for Chronic diastolic heart failure and additional issues. Started Amiodarone 200 mg 1 tablet twice daily for 14 days them once daily. Discontinued Robitussin DM, Catherine [name], Mupirocin and Catherine [name]. No follow up noted.   Hospital visits: Admitted to [facility] on 01/05/2022 due to Acute respiratory failure with hypoxia  secondary to acute on  chronic COPD and diastolic congestive heart failure exacerbation. Discharge date was 01/12/2022.  New?Medications Started at [facility] Discharge:?? benzonatate (Tessalon Perles) Catherine [name]-dextromethorphan (ROBITUSSIN  DM) losartan (COZAAR) predniSONE (DELTASONE) Medication Changes at Hospital Discharge: albuterol (VENTOLIN HFA) albuterol (PROVENTIL) metoprolol succinate (TOPROL-XL) Medications Discontinued at Hospital Discharge: No medications discontinued Medications that remain the same after Hospital Discharge:??  -All other medications will remain the same.    2/2-11/03/21 Patient admitted to [facility] for acute respiratory failure with hypoxia. Prescribed prednisone.  10/25/21 Patient presented to [place] ED for viral syndrome.   Objective:  Lab Results  Component Value Date   CREATININE 1.18 (H) 04/19/2022   BUN 19 04/19/2022   GFR 46.59 (L) 03/17/2022   GFRNONAA 58 (L) 01/18/2022   GFRAA 67 10/29/2020   NA 146 (H) 04/19/2022   K 3.8 04/19/2022   CALCIUM 9.2 04/19/2022   CO2 23 04/19/2022   GLUCOSE 67 (L) 04/19/2022    Lab Results  Component Value Date/Time   HGBA1C 5.3 10/12/2016 01:59 PM   GFR 46.59 (L) 03/17/2022 01:31 PM   GFR 62.02 12/21/2021 02:37 PM   MICROALBUR 1.6 12/21/2021 02:37 PM    Last diabetic Eye exam: No results found for: "HMDIABEYEEXA"  Last diabetic Foot exam: No results found for: "HMDIABFOOTEX"   Lab Results  Component Value Date   CHOL 146 05/22/2021   HDL 56 05/22/2021   LDLCALC 70 05/22/2021   LDLDIRECT 68.0 09/24/2019   TRIG 109 05/22/2021   CHOLHDL 2.6 05/22/2021       Latest Ref Rng & Units 01/05/2022    1:16 AM 12/21/2021    2:37 PM 10/29/2021    7:20 PM  Hepatic Function  Total Protein 6.5 - 8.1 g/dL 7.1  6.5  6.7   Albumin 3.5 - 5.0 g/dL 4.1  4.1  3.7   AST 15 - 41 U/L 19  25  20    ALT 0 - 44 U/L 21  16  17    Alk Phosphatase 38 - 126 U/L 87  63  71   Total Bilirubin 0.3 - 1.2 mg/dL 0.2  0.5  0.9     Lab Results  Component Value Date/Time   TSH 1.716 01/05/2022 10:41 AM   TSH 3.39 11/30/2021 11:36 AM   TSH 3.95 07/09/2020 11:35 AM   FREET4 0.81 03/03/2015 10:51 AM   FREET4 0.81 03/04/2014 11:01  AM       Latest Ref Rng & Units 04/19/2022   11:39 AM 01/18/2022    1:08 PM 01/12/2022   12:52 AM  CBC  WBC 3.4 - 10.8 x10E3/uL 7.2  16.6  14.1   Hemoglobin 11.1 - 15.9 g/dL 12.6  15.1  13.6   Hematocrit 34.0 - 46.6 % 37.4  41.4  40.8   Platelets 150 - 450 x10E3/uL 185  180  178     Lab Results  Component Value Date/Time   VD25OH 25.0 (L) 01/20/2021 12:09 PM   VD25OH 36 06/06/2012 11:09 AM    Clinical ASCVD: Yes  The ASCVD Risk score (Arnett DK, et al., 2019) failed to calculate for the following reasons:   The 2019 ASCVD risk score is only valid for ages 50 to 20       04/09/2022   12:42 PM 11/06/2021   11:01 AM 04/07/2021    9:09 AM  Depression screen PHQ 2/9  Decreased Interest 0 0 0  Down, Depressed, Hopeless 0 0 0  PHQ - 2 Score 0 0 0  CHA2DS2/VAS Stroke Risk Points  Current as of 10 minutes ago     6 >= 2 Points: High Risk  1 - 1.99 Points: Medium Risk  0 Points: Low Risk    Last Change: N/A      Details    This score determines the patient's risk of having a stroke if the  patient has atrial fibrillation.       Points Metrics  1 Has Congestive Heart Failure:  Yes    Current as of 10 minutes ago  1 Has Vascular Disease:  Yes    Current as of 10 minutes ago  1 Has Hypertension:  Yes    Current as of 10 minutes ago  2 Age:  89    Current as of 10 minutes ago  0 Has Diabetes:  No    Current as of 10 minutes ago  0 Had Stroke:  No  Had TIA:  No  Had Thromboembolism:  No    Current as of 10 minutes ago  1 Female:  Yes    Current as of 10 minutes ago         Social History   Tobacco Use  Smoking Status Former   Packs/day: 1.00   Years: 15.00   Total pack years: 15.00   Types: Cigarettes   Quit date: 09/28/1975   Years since quitting: 46.6  Smokeless Tobacco Never   BP Readings from Last 3 Encounters:  05/28/22 116/60  04/12/22 118/60  04/07/22 100/60   Pulse Readings from Last 3 Encounters:  05/28/22 60  04/12/22 61  04/07/22 75   Wt  Readings from Last 3 Encounters:  05/28/22 135 lb 9.6 oz (61.5 kg)  04/12/22 137 lb (62.1 kg)  04/09/22 138 lb (62.6 kg)   BMI Readings from Last 3 Encounters:  05/28/22 24.02 kg/m  04/12/22 24.27 kg/m  04/09/22 24.45 kg/m    Assessment/Interventions: Review of patient past medical history, allergies, medications, health status, including review of consultants reports, laboratory and other test data, was performed as part of comprehensive evaluation and provision of chronic care management services.   SDOH:  (Social Determinants of Health) assessments and interventions performed: Yes (last 04/06/22) SDOH Interventions    Flowsheet Row Clinical Support from 04/09/2022 in [place] at [place] Management from 11/06/2021 in [place] at [place] from 04/07/2021 in [place] at [place] from 07/01/2020 in [place] at [place] Management from 06/17/2020 in [place] at [place] from 03/25/2017 in [place] Interventions        Food Insecurity Interventions Intervention Not Indicated Intervention Not Indicated Intervention Not Indicated -- -- --  Housing Interventions Intervention Not Indicated Intervention Not Indicated Intervention Not Indicated -- -- --  Transportation Interventions Intervention Not Indicated Intervention Not Indicated -- -- Intervention Not Indicated --  Depression Interventions/Treatment  -- -- -- PHQ2-9 Score <4 Follow-up Not Indicated -- --  [No treatment needed. ]  Financial Strain Interventions Intervention Not Indicated Intervention Not Indicated -- -- Other (Comment)  [Referred to [facility]] --  Physical Activity Interventions Intervention Not Indicated Other (Comments)  [reviewed Silver Sneakers when able] Intervention Not Indicated -- -- --  Stress Interventions Intervention Not Indicated Intervention Not  Indicated -- -- -- --  Social Connections Interventions Intervention Not Indicated -- Intervention Not Indicated -- -- --      SDOH Screenings   Food Insecurity: No Food Insecurity (04/09/2022)  Housing: Low Risk  (04/09/2022)  Transportation Needs: No Transportation Needs (04/09/2022)  Alcohol Screen: Low Risk  (11/06/2021)  Depression (PHQ2-9): Low Risk  (04/09/2022)  Financial Resource Strain: Low Risk  (04/09/2022)  Physical Activity: Insufficiently Active (04/09/2022)  Social Connections: Socially Integrated (04/09/2022)  Stress: No Stress Concern Present (04/09/2022)  Tobacco Use: Medium Risk (05/28/2022)    CCM Care Plan  Allergies  Allergen Reactions   Aldactone [Spironolactone] Other (See Comments)    dyspnea   Amoxicillin Palpitations    Tachycardia Has patient had a PCN reaction causing immediate rash, facial/tongue/throat swelling, SOB or lightheadedness with hypotension: no Has patient had a PCN reaction causing severe rash involving mucus membranes or skin necrosis: {no Has patient had a PCN reaction that required hospitalization {no Has patient had a PCN reaction occurring within the last 10 years: {yes If all of the above answers are "NO", then may proceed with Cephalosporin use.   Diltiazem Other (See Comments)    Causing headaches    Flagyl [Metronidazole Hcl] Other (See Comments)    Causing headaches     Flovent [Fluticasone Propionate] Other (See Comments)    Leg cramps   Gabapentin Swelling   Lyrica [Pregabalin] Swelling   Quinidine Diarrhea and Other (See Comments)    Fever diarrhea   Simvastatin Other (See Comments)    Leg pain, myalgia   Tramadol Nausea Only   Verapamil Other (See Comments)    myalgias   Amlodipine     Low extremity edema   Crestor [Rosuvastatin]     myalgia   Livalo [Pitavastatin]     myalgias   Zetia [Ezetimibe]     LEG CRAMPS    Benazepril Hcl Cough   Ciprofloxacin Diarrhea   Codeine Nausea Only   Nitrofurantoin Monohyd  Macro Nausea Only    Medications Reviewed Today     Reviewed by Catherine Post, MD (Physician) on 05/28/22 at 1453  Med List Status: <None>   Medication Order Taking? Sig Documenting Provider Last Dose Status Informant  acetaminophen (TYLENOL) 500 MG tablet [id number] Yes Take 1,000 mg by mouth every 6 (six) hours as needed for mild pain. [provider] Taking Active Self, Pharmacy Records  albuterol (PROVENTIL) (2.5 MG/3ML) 0.083% nebulizer solution [id number] Yes Take 3 mLs (2.5 mg total) by nebulization every 4 (four) hours as needed for wheezing or shortness of breath. Caren Griffins, MD Taking Active   albuterol (VENTOLIN HFA) 108 (90 Base) MCG/ACT inhaler [id number] Yes INHALE 2 PUFFS INTO THE LUNGS EVERY 4 HOURS AS NEEDED FOR AHEEZING OR SHORTNESS OF Hulan Amato, MD Taking Active Self, Pharmacy Records  bumetanide [facility]) 2 MG tablet [id number] Yes Take 1 tablet (2 mg total) by mouth 2 (two) times daily. Catherine Latch, MD Taking Active   cholecalciferol (VITAMIN D3) 25 MCG (1000 UNIT) tablet [id number] Yes Take 1,000 Units by mouth daily. [provider] Taking Active Self, Pharmacy Records  diazepam (VALIUM) 5 MG tablet [id number] Yes TAKE ONE TABLET AT BEDTIME Catherine Post, MD Taking Active   Glycopyrrolate-Formoterol (BEVESPI AEROSPHERE) 9-4.8 MCG/ACT AERO [id number] Yes Inhale 2 puffs into the lungs 2 (two) times daily. Tanda Rockers, MD Taking Active   metolazone (ZAROXOLYN) 5 MG tablet [id number] Yes Take one 65m tablet 30 minutes prior to morning Bumex dose on Fridays. WLoel Dubonnet NP Taking Active   metoprolol succinate (TOPROL-XL) 25 MG 24 hr tablet [id number] Take 1 tablet (25 mg total) by mouth daily. FJoette Catching PA-C  Expired 03/19/22 2359   Multiple Vitamins-Minerals (HAIR/SKIN/NAILS/BIOTIN PO) [id number] Yes Take 1 tablet by mouth daily. [provider] Taking Active Self, Pharmacy Records  Multiple  Vitamins-Minerals (MULTIVITAMIN ADULT Williemae Natter) CHEW [id number] Yes Chew 2 tablets by mouth daily. [provider] Taking Active Self, Pharmacy Records  potassium chloride (KLOR-CON) 10 MEQ tablet [id number] Yes Take 6 tablets (60 mEq total) by mouth 2 (two) times daily. Loel Dubonnet, NP Taking Active   sacubitril-valsartan (ENTRESTO) 24-26 [place] [id number] Yes Take 1 tablet by mouth 2 (two) times daily. Catherine Latch, MD Taking Active   warfarin (COUMADIN) 2 MG tablet [id number] Yes Take 1 tablet daily or take as directed by anticoagulation clinic Catherine Post, MD Taking Active             Patient Active Problem List   Diagnosis Date Noted   COPD  GOLD 2 03/02/2022   Hypertension    Chronic kidney disease    Anxiety    Acute exacerbation of CHF (congestive heart failure) ([place]) 01/05/2022   Chest wall pain 11/02/2021   Nausea without vomiting 10/30/2021   Tricuspid regurgitation 07/14/2021   Protein-calorie malnutrition, severe 09/05/2019   Dehydration    Gastroenteritis    Enteritis 09/03/2019   COVID-19 08/27/2019   Dysuria 07/07/2019   Chronic anxiety 12/23/2017   Aortic stenosis, mild 07/26/2017   S/P Maze operation for atrial fibrillation 10/14/2016   S/P CABG x 1 10/14/2016   Cellulitis of left lower extremity 08/17/2016   Coronary artery disease    Chronic insomnia 07/12/2016   DOE (dyspnea on exertion) 03/26/2015   Warfarin-induced coagulopathy ([place]) 03/16/2015   Valvular heart disease 03/16/2015   Nephrolithiasis 03/16/2015   Acute respiratory failure with hypoxia  secondary to acute on chronic COPD and diastolic congestive heart failure exacerbation 03/11/2015   Essential hypertension    RHEUMATIC MITRAL STENOSIS 01/05/2008   Rheumatic heart disease 01/05/2008   Atrial fibrillation ([place]) 01/05/2008    Immunization History  Administered Date(s) Administered   Fluad Quad(high Dose 65+) 07/01/2020   Influenza Split 08/22/2011, 08/06/2012,  07/26/2013   Influenza, High Dose Seasonal PF 09/17/2015, 07/12/2016, 08/01/2017   Influenza,inj,Quad PF,6+ Mos 06/21/2019   Influenza,inj,quad, With Preservative 08/28/2018, 06/21/2019   Influenza-Unspecified 09/18/2018, 09/22/2021   Moderna Sars-Covid-2 Vaccination 11/06/2019, 12/04/2019, 08/19/2020   Pneumococcal Conjugate-13 09/17/2015   Pneumococcal Polysaccharide-23 11/22/2011   Tdap 05/04/2012   Zoster Recombinat (Shingrix) 10/13/2017, 12/13/2017   Zoster, Live 08/22/2011   Patient is doing much better than when we spoke last. Her BP isn't low anymore, she's breathing a lot better with the new inhaler but has noticed more hoarseness. We discussed rinsing out her mouth to see if that helps and drinking plenty of water throughout the day. Repeat CAT score was 8. Patient was previously using albuterol a few times a week and now has only used it once since switching inhalers. Patient reports she is having more problems with sleep as well but it could be related to pain.   Patient is taking the Bumetanide at 9am and then 4-5pm. She does go to the bathroom often during the night as well.  Conditions to be addressed/monitored:  Hypertension, Hyperlipidemia, Atrial Fibrillation, Heart Failure, Coronary Artery Disease, COPD, and Anxiety  Conditions addressed this visit: COPD, hypertension, heart failure   Care Plan : CCM Pharmacy Care Plan  Updates made by Catherine [name], [place] since 05/28/2022 12:00 AM     Problem: Problem: Hypertension, Hyperlipidemia, Atrial Fibrillation, Heart Failure, Coronary Artery Disease,  COPD, and Anxiety      Long-Range Goal: Patient-Specific Goal   Start Date: 06/17/2021  Expected End Date: 06/17/2022  Recent Progress: On track  Priority: High  Note:   Current Barriers:  Unable to independently monitor therapeutic efficacy  Pharmacist Clinical Goal(s):  Patient will achieve adherence to monitoring guidelines and medication adherence to achieve  therapeutic efficacy through collaboration with PharmD and provider.   Interventions: 1:1 collaboration with Catherine Post, MD regarding development and update of comprehensive plan of care as evidenced by provider attestation and co-signature Inter-disciplinary care team collaboration (see longitudinal plan of care) Comprehensive medication review performed; medication list updated in electronic medical record  Hypertension (BP goal <140/90) -Controlled -Current treatment: Bumetanide 2 mg 1 tablet twice daily  - Appropriate, Effective, Safe, Accessible Metoprolol XL 25 mg daily - in PM - Appropriate, Query effective, Safe, Accessible Entresto 24-26 mg 1 tablet twice daily - Appropriate, Effective, Safe, Accessible -Medications previously tried: doxazosin (low BP), amlodipine, atenolol, irbesartan, losartan, maxzide, nebivolol, spironolactone  -Current home readings: 126/62, 101/60, 129/58, 104/59, 105/50, 118/52 (stopped doxazosin 8/11 - all readings [name] stopping doxazosin) -Current dietary habits: tries to limit salt intake -Current exercise habits: limited in her ability -Denies hypotensive/hypertensive symptoms -Educated on BP goals and benefits of medications for prevention of heart attack, stroke and kidney damage; Importance of home blood pressure monitoring; Proper BP monitoring technique; Symptoms of hypotension and importance of maintaining adequate hydration; -Counseled to monitor BP at home at least weekly, document, and provide log at future appointments -Counseled on diet and exercise extensively Recommended to continue current medication  Hyperlipidemia: (LDL goal < 70) -Not ideally controlled -Current treatment: No medications -Medications previously tried: rosuvastatin (myalgias), Livalo (cost),  Zetia (myalgias), simvastatin (myalgias) -Current dietary patterns: did not discuss -Current exercise habits: minimal -Educated on Cholesterol goals;  Importance of  limiting foods high in cholesterol; Exercise goal of 150 minutes per week; -Counseled on diet and exercise extensively Recommended to continue current medication  Atrial Fibrillation (Goal: prevent stroke and major bleeding) -Controlled -CHADSVASC: 6 -Current treatment: Rate control: Metoprolol XL 25 mg daily - Appropriate, Effective, Safe, Accessible Anticoagulation: warfarin 2 mg tablets as directed by coumadin clinic - Appropriate, Effective, Safe, Accessible -Medications previously tried: Amiodarone -Home BP and HR readings: refer to above  -Counseled on increased risk of stroke due to Afib and benefits of anticoagulation for stroke prevention; bleeding risk associated with warfarin and importance of self-monitoring for signs/symptoms of bleeding; avoidance of NSAIDs due to increased bleeding risk with anticoagulants; -Recommended to continue current medication  Heart Failure (Goal: manage symptoms and prevent exacerbations) -Controlled -Last ejection fraction: 60-65% (Date: 10/15/19) -HF type: Diastolic -NYHA Class: II (slight limitation of activity) -AHA HF Stage: B (Heart disease present - no symptoms present) -Current treatment: Bumetanide 2 mg 1 tablet twice daily - Appropriate, Query effective, Safe, Accessible Metoprolol XL 25 mg daily  - Appropriate, Effective, Safe, Accessible Potassium chloride 10 mEq 8 tablets daily - Appropriate, Effective, Safe, Accessible Entresto 24-36 mg 1 tablet twice daily - Appropriate, Effective, Safe, Query accessible Metolazone 2.5 mg 1 dose every Friday - Appropriate, Effective, Safe, Accessible -Medications previously tried: none  -Current home BP/HR readings: refer to above -Current dietary habits: limits salt intake; patient is closely monitoring sodium intake and is down to < 2000 mcg per day -Current exercise habits: minimal -Educated on Importance of weighing daily; if you gain more than 3 pounds in one day or 5 pounds in one week,  call  cardiologist. Proper diuretic administration and potassium supplementation Importance of blood pressure control -Counseled on diet and exercise extensively Recommended to continue current medication  COPD (Goal: control symptoms and prevent exacerbations) -Controlled -Current treatment  Albuterol HFA 108 mcg/act as needed - Appropriate, Effective, Safe, Accessible Bevespi aerosphere 9-4.8 mcg/act 2 puffs twice daily - Query Appropriate, Effective, Safe, Accessible -Medications previously tried: Symbicort (switched to Catherine Corning) -Gold Grade: Gold 2 (FEV1 50-79%) -Current COPD Classification:  C (low sx, >/=2 exacerbations/yr) -MMRC/CAT score: n/a -Pulmonary function testing: 2019 -Exacerbations requiring treatment in last 6 months: yes -Patient denies consistent use of maintenance inhaler -Frequency of rescue inhaler use: sometimes once a week -Counseled on Proper inhaler technique; Benefits of consistent maintenance inhaler use When to use rescue inhaler Differences between maintenance and rescue inhalers -Recommended to continue current medication Recommended repeat spirometry.  Anxiety (Goal: minimize symptoms) -Not ideally controlled -Current treatment: Diazepam 5 mg at bedtime - Appropriate, Effective, Query Safe, Accessible -Medications previously tried/failed: n/a -PHQ9: 0 -GAD7: n/a -Educated on Benefits of medication for symptom control Benefits of cognitive-behavioral therapy with or without medication -Recommended to continue current medication   Health Maintenance -Vaccine gaps: COVID booster, influenza -Current therapy:  Fluconazole 150 mg  Multivitamin daily (Vitafusion Multivite Gummies)  Hair skin and nails multivitamin daily Vitamin D 5000 units daily Catherine [name] ODT 4 mg q8hr PRN  Magni-Life Topical Cream  Potassium 10 mEq 6 tablets daily (8 tablets twice daily)  -Educated on Cost vs benefit of each product must be carefully weighed by individual  consumer -Patient is satisfied with current therapy and denies issues -Recommended to continue current medication  Patient Goals/Self-Care Activities Patient will:  - take medications as prescribed check blood pressure weekly, document, and provide at future appointments target a minimum of 150 minutes of moderate intensity exercise weekly  Follow Up Plan: Telephone follow up appointment with care management team member scheduled for: 2 months     Medication Assistance:  Charolotte Eke and Entresto obtained through [employer] and [employer] medication assistance program.  Enrollment ends 09/26/22  Compliance/Adherence/Medication fill history: Care Gaps: COVID booster, tetanus, influenza Last BP: 118/60 on 04/12/2022  Star-Rating Drugs: None  Patient's preferred pharmacy is:  [facility], [place] [address] Phone: [phone] Fax: [place] [address] [place] [address] Phone: [phone] Fax: [place], [place]. [place] [address] Phone: [phone] Fax: [phone]  [facility]orene Grebe, [place] [address] [place] [address] Phone: [phone] Fax: [phone]   Uses pill box? Yes Pt endorses 99% compliance  We discussed: Current pharmacy is preferred with insurance plan and patient is satisfied with pharmacy services Patient decided to: Continue current medication management strategy  Care Plan and Follow Up Patient Decision:  Patient agrees to Care Plan and Follow-up.  Plan: Telephone follow up appointment with care management team member scheduled for:  2 months  [name], PharmD, [place] Pharmacist [facility] [phone]

## 2022-05-28 NOTE — Telephone Encounter (Signed)
Rx request sent to pharmacy.  

## 2022-05-28 NOTE — Patient Instructions (Signed)
I am setting up referral to [facility] for facial lesion.

## 2022-05-28 NOTE — Progress Notes (Signed)
Established Patient Office Visit  Subjective   Patient ID: Catherine [name], female    DOB: [birth year]  Age: 87 y.o. MRN: [id number]  Chief Complaint  Patient presents with   Vaginitis    X1 week,    HPI   Seen today for the following items  Vaginal irritation and constant burning sensation.  Started about 8 days ago.  She went to the pharmacy and got some miconazole and inserted intravaginally and has seen some improvement but still has some burning.  No burning with urination whatsoever.  She states she has had multiple UTIs previously and the symptoms are different.  She noticed some external redness.  She does have stress urine incontinence and frequent leakage and feels like that is an exacerbating factor.  She also had recent steroid injection in her back about 10 days ago.  No recent antibiotics.  No history of diabetes.  Other issue is skin lesion left cheek present about 6 months.  Frequent crusting.  She tries not to traumatize.  Also has a couple of scaly lesions right cheek.  Past Medical History:  Diagnosis Date   Acute on chronic diastolic heart failure (HCC)    Aortic stenosis, mild 07/26/2017   Arthritis    Asthma    last attack 02/2015   Atrial fibrillation, chronic (HCC)    Cellulitis of left lower extremity 08/17/2016   Ulcer associated with severe venous insufficiency   Chronic anticoagulation    Chronic diastolic CHF (congestive heart failure) ([place])    Chronic kidney disease    "RIGHT MANY KIDNEY INFECTIONS AND STONES"   COPD (chronic obstructive pulmonary disease) (HCC)    Coronary artery disease    Dizziness    H/O: rheumatic fever    Heart murmur    Hypertension    PONV (postoperative nausea and vomiting)    ' SOMETIMES', BUT NOT ALWAYS"   S/P Maze operation for atrial fibrillation 10/14/2016   Complete bilateral atrial lesion set using cryothermy and bipolar radiofrequency ablation - atrial appendage was not treated due to previous surgical  procedure (open mitral commissurotomy)   S/P mitral valve replacement with bioprosthetic valve 10/14/2016   29 mm Medtronic Mosaic porcine bioprosthetic tissue valve   Tricuspid regurgitation 07/14/2021   UTI (urinary tract infection)    Valvular heart disease    Has mitral stenosis with prior mitral commissurotomy in 1970   Past Surgical History:  Procedure Laterality Date   ABDOMINAL HYSTERECTOMY  1983   endometriosis   APPENDECTOMY     BACK SURGERY     neurosurgery x2   CARDIAC CATHETERIZATION     CARDIAC CATHETERIZATION N/A 08/03/2016   Procedure: Right/Left Heart Cath and Coronary Angiography;  Surgeon: Peter M Martinique, MD;  Location: [facility];  Service: Cardiovascular;  Laterality: N/A;   cataract surg     CHOLECYSTECTOMY N/A 05/30/2015   Procedure: LAPAROSCOPIC CHOLECYSTECTOMY WITH INTRAOPERATIVE CHOLANGIOGRAM;  Surgeon: Excell Seltzer, MD;  Location: [facility];  Service: General;  Laterality: N/A;   COLONOSCOPY     CORONARY ARTERY BYPASS GRAFT N/A 10/14/2016   Procedure: CORONARY ARTERY BYPASS GRAFTING (CABG);  Surgeon: Rexene Alberts, MD;  Location: [facility];  Service: Open Heart Surgery;  Laterality: N/A;   EYE SURGERY     MAZE N/A 10/14/2016   Procedure: MAZE;  Surgeon: Rexene Alberts, MD;  Location: [facility];  Service: Open Heart Surgery;  Laterality: N/A;   MITRAL VALVE REPLACEMENT N/A 10/14/2016   Procedure: REDO MITRAL  VALVE REPLACEMENT (MVR);  Surgeon: Rexene Alberts, MD;  Location: [facility];  Service: Open Heart Surgery;  Laterality: N/A;   MITRAL VALVE SURGERY Left 1970   Open mitral commissurotomy via left thoracotomy approach   TEE WITHOUT CARDIOVERSION N/A 07/05/2016   Procedure: TRANSESOPHAGEAL ECHOCARDIOGRAM (TEE);  Surgeon: [name], MD;  Location: [facility];  Service: Cardiovascular;  Laterality: N/A;   TEE WITHOUT CARDIOVERSION N/A 10/14/2016   Procedure: TRANSESOPHAGEAL ECHOCARDIOGRAM (TEE);  Surgeon: Rexene Alberts, MD;  Location: [facility];  Service:  Open Heart Surgery;  Laterality: N/A;    reports that she quit smoking about 46 years ago. Her smoking use included cigarettes. She has a 15.00 pack-year smoking history. She has never used smokeless tobacco. She reports that she does not drink alcohol and does not use drugs. family history includes Leukemia in her father. Allergies  Allergen Reactions   Aldactone [Spironolactone] Other (See Comments)    dyspnea   Amoxicillin Palpitations    Tachycardia Has patient had a PCN reaction causing immediate rash, facial/tongue/throat swelling, SOB or lightheadedness with hypotension: no Has patient had a PCN reaction causing severe rash involving mucus membranes or skin necrosis: {no Has patient had a PCN reaction that required hospitalization {no Has patient had a PCN reaction occurring within the last 10 years: {yes If all of the above answers are "NO", then may proceed with Cephalosporin use.   Diltiazem Other (See Comments)    Causing headaches    Flagyl [Metronidazole Hcl] Other (See Comments)    Causing headaches     Flovent [Fluticasone Propionate] Other (See Comments)    Leg cramps   Gabapentin Swelling   Lyrica [Pregabalin] Swelling   Quinidine Diarrhea and Other (See Comments)    Fever diarrhea   Simvastatin Other (See Comments)    Leg pain, myalgia   Tramadol Nausea Only   Verapamil Other (See Comments)    myalgias   Amlodipine     Low extremity edema   Crestor [Rosuvastatin]     myalgia   Livalo [Pitavastatin]     myalgias   Zetia [Ezetimibe]     LEG CRAMPS    Benazepril Hcl Cough   Ciprofloxacin Diarrhea   Codeine Nausea Only   Nitrofurantoin Monohyd Macro Nausea Only    Review of Systems  Constitutional:  Negative for chills and fever.  Respiratory:  Negative for cough.   Cardiovascular:  Negative for chest pain.  Genitourinary:  Negative for dysuria and hematuria.      Objective:     BP 116/60 (BP Location: Left Arm, Patient Position: Sitting, Cuff  Size: Normal)   Pulse 60   Temp 98 F (36.7 C) (Oral)   Ht '5\' 3"'$  (1.6 m)   Wt 135 lb 9.6 oz (61.5 kg)   SpO2 97%   BMI 24.02 kg/m  BP Readings from Last 3 Encounters:  05/28/22 116/60  04/12/22 118/60  04/07/22 100/60   Wt Readings from Last 3 Encounters:  05/28/22 135 lb 9.6 oz (61.5 kg)  04/12/22 137 lb (62.1 kg)  04/09/22 138 lb (62.6 kg)      Physical Exam Vitals reviewed.  Cardiovascular:     Rate and Rhythm: Normal rate.  Pulmonary:     Effort: Pulmonary effort is normal.  Skin:    Comments: Left cheek reveals approximately 3 x 3 mm crusted skin lesion.  No nodular quality. Right cheek reveals brownish well-demarcated scaly lesion and a smaller similar lesion just superior to this  Neurological:  Mental Status: She is alert.      No results found for any visits on 05/28/22.    The ASCVD Risk score (Arnett DK, et al., 2019) failed to calculate for the following reasons:   The 2019 ASCVD risk score is only valid for ages 77 to 42    Assessment & Plan:   #1 vaginitis symptoms.  Probable yeast vaginitis related to her stress incontinence and increased moisture -Keep area dry as possible -Continue topical antifungal -Add fluconazole 150 mg x 1 dose -Touch base for any persistent symptoms  #2 nonhealing crusted skin lesion left cheek region.  Given duration of symptoms set up referral to dermatology for further evaluation.  #3 benign-appearing seborrheic keratoses right cheek   No follow-ups on file.    [name], MD

## 2022-06-01 ENCOUNTER — Encounter: Payer: Self-pay | Admitting: Internal Medicine

## 2022-06-01 ENCOUNTER — Other Ambulatory Visit: Payer: Self-pay | Admitting: Family Medicine

## 2022-06-01 ENCOUNTER — Telehealth: Payer: Self-pay | Admitting: Pharmacist

## 2022-06-01 ENCOUNTER — Ambulatory Visit: Payer: Medicare Other | Admitting: Internal Medicine

## 2022-06-01 DIAGNOSIS — D3132 Benign neoplasm of left choroid: Secondary | ICD-10-CM | POA: Diagnosis not present

## 2022-06-01 DIAGNOSIS — H04123 Dry eye syndrome of bilateral lacrimal glands: Secondary | ICD-10-CM | POA: Diagnosis not present

## 2022-06-01 DIAGNOSIS — H43813 Vitreous degeneration, bilateral: Secondary | ICD-10-CM | POA: Diagnosis not present

## 2022-06-01 DIAGNOSIS — R0609 Other forms of dyspnea: Secondary | ICD-10-CM

## 2022-06-01 DIAGNOSIS — H35071 Retinal telangiectasis, right eye: Secondary | ICD-10-CM | POA: Diagnosis not present

## 2022-06-01 DIAGNOSIS — D492 Neoplasm of unspecified behavior of bone, soft tissue, and skin: Secondary | ICD-10-CM | POA: Diagnosis not present

## 2022-06-01 DIAGNOSIS — H26492 Other secondary cataract, left eye: Secondary | ICD-10-CM | POA: Diagnosis not present

## 2022-06-01 DIAGNOSIS — J449 Chronic obstructive pulmonary disease, unspecified: Secondary | ICD-10-CM

## 2022-06-01 DIAGNOSIS — R300 Dysuria: Secondary | ICD-10-CM

## 2022-06-01 DIAGNOSIS — Z961 Presence of intraocular lens: Secondary | ICD-10-CM | POA: Diagnosis not present

## 2022-06-01 NOTE — Assessment & Plan Note (Addendum)
Echo 10/30/21 1. LVEF  60 to 65%. The left ventricle has normal function. The left ventricle has no  regional wall motion abnormalities. LVdiastolic parameters are  indeterminate.  2. Right ventricular systolic function is mildly reduced. The right  ventricular size is mildly enlarged. There is moderately elevated  pulmonary artery systolic pressure.  3. Left atrial size was moderately dilated.  4. Right atrial size was moderately dilated.  5 The mitral valve has been repaired/replaced. No evidence of mitral valve  regurgitation. No evidence of  mitral stenosis.  6. Tricuspid valve regurgitation is moderate.  7. The aortic valve is tricuspid. There is moderate calcification of the  aortic valve. There is moderate thickening of the aortic valve. Aortic  valve regurgitation is not visualized. Mild aortic valve stenosis.  8. The inferior vena cava is normal in size with greater than 50%  respiratory variability, suggesting right atrial pressure of 3 mmHg.   rx per cards  NB: The sacubitrilat component of entresto is not and ACEi but it does lead to higher levels of bradykinin (the culprit in ACEi related cough) because it reduces Neprilysin based clearance of bradykinin. The typical symptoms are dry daytime cough (9% per PI) or complaints of a new sensation of globus or excess PNDS as is the case here, so low threshold given nl ef to try off entresto and just on the valsartan component at a higher dose.

## 2022-06-01 NOTE — Progress Notes (Unsigned)
Subjective:     Patient ID: Catherine [name], female   DOB: [birth year]    MRN: [id number]    Brief patient profile:  87   yowf quit smoking in 1977 with GOLD II criteria 6/29016      07/29/2016  Pulmonary consultation /Jakyron Fabro re: unexplained sob in pt with Mitral stenosis/ GOLD II criteria copd  Chief Complaint  Patient presents with   Pulmonary Consult    Referred by Dr. Oval Linsey. Pt states that she is going to have mitral valve replacement soon. She c/o increased SOB since her last visit in 2016. She gets SOB doing housework.     progressive doe x year not able to trigger hfa effectively (at least a one second delay)  Previously able to do half mile on a treadmill  X  3.5 mph and flat now  2.2 x 0.2 of a mile then stops due to sob occ orthopnea/ maybe once a month Always recovers within a few minutes   rec Plan A = Automatic = Symbicort 80 Take 2 puffs first thing in am and then another 2 puffs about 12 hours later.  Work on inhaler technique:  - late add  If really needs hfa may be required to use spacer as could never trigger the mdi at onset of insp, only > 1 sec prior Plan B = Backup Only use your albuterol as rescue    Oct 14 2016  MVR [name]> marked improvement in doe         Admit date: 01/05/2022 Discharge date: 01/12/2022   Admitted From: home Disposition:  home   Recommendations for Outpatient Follow-up:  Follow up with PCP in 1-2 weeks Please obtain BMP/CBC in one week   Home Health: none  Equipment/Devices: home O2   Discharge Condition: stable CODE STATUS: Full code Diet recommendation: low sodium   HPI: Per admitting MD, Catherine [name] is a 87 y.o. female with medical history significant of chronic diastolic CHF, moderate tricuspid regurg, CAD s/p CABG, rheumatic heart disease, chronic atrial fibrillation on Coumadin, and CKD stage III presents with complaints of progressively worsening shortness of breath over the last 4 days.  She complains of having a  productive cough with beige to brownish sputum production.  Been taking Mucinex without improvement.  Associated symptoms include wheezing, leg swelling, weight from 147 pounds to 152 pounds.  Denies having any significant fever.  She has been abiding by low-sodium diet and taking furosemide 40 mg twice daily.  Last hospitalized 2/2-2/7 for acute respiratory failure with hypoxia related to a combination of COPD and diastolic heart failure. On admission to the emergency department patient was noted to be afebrile with respirations 15-23, blood pressures maintained, and O2 saturations as low as 87-88% on room air with improvement on 3 L of nasal cannula oxygen.  Labs significant for platelets 128, potassium 3.3, BNP 184.1, INR 3, and high-sensitivity troponins negative x2.  Chest x-ray noted cardiomegaly with small left effusion and mild diffuse interstitial opacity concerning for edema or inflammatory process.  Patient was suspected to be fluid overloaded and given Lasix 60 mg IV, Valium 2.5 mg IV, and DuoNeb breathing treatments.   Hospital Course / Discharge diagnoses: Principal Problem:   Acute respiratory failure with hypoxia  secondary to acute on chronic COPD and diastolic congestive heart failure exacerbation Active Problems:   Atrial fibrillation (HCC)   Hypertension   Chronic kidney disease   Coronary artery disease   Anxiety     Assessment  and Plan: Principal problem Acute respiratory failure with hypoxia  secondary to acute on chronic COPD and diastolic congestive heart failure exacerbation -cardiology consulted, appreciate input.  She was placed on IV furosemide, with significant improvement in her volume status and currently appears dry/euvolemic.  She was able to be weaned off to room air, however on ambulation she was found to require oxygen which I suspect is due to her underlying COPD.  She was maintained on steroids with improvement in her wheezing.  She was converted to p.o.  diuretics per cardiology, will also do a steroid taper.  She is significantly improved, able to ambulate in the hallway, feeling better, and will be discharged home in stable condition with outpatient follow-up.  She has been reporting some dyspnea for several months, likely multifactorial due to deconditioning, COPD, diastolic CHF, A-fib.    Active problems Atrial fibrillation, intermittent RVR-Continue heart rate control with metoprolol and anticoagulation with Coumadin.  Currently on metoprolol 35.7 twice daily.  Rates overall stable, will prescribe on discharge  Hypertension -Continue metoprolol as below, on furosemide.  She was added on losartan  CKD 3a -Cr stable with diuresis Coronary artery disease -on aspirin, statin, no chest pain or ACS type features Dyslipidemia- continue with rosuvastatin.   Anxiety -Continue home regimen   Sepsis ruled out  03/02/2022  f/u ov/Norvel Wenker re: GOLD 2  copd  maint on symb 80 2bid   Chief Complaint  Patient presents with   Follow-up    Breathing is overall doing well. She c/o occ prod cough with clear sputum.   Dyspnea:  walks to [place] "h[place] both ways" x sev hundred feet same walk  as usual but now slower / more tired  on high dose lopressor to control AF Cough: same as usual/ sporadic mucoid  Sleeping: bed blocks and pillows  SABA use: neb daily ? Not sure it does much  02: not using using / just prn and not titrating  Rec Plan A = Automatic = Always=    symbicort 80 Take 2 puffs first thing in am and then another 2 puffs about 12 hours later.  Work on inhaler technique:   Plan B = Backup (to supplement plan A, not to replace it) Only use your albuterol inhaler as a rescue medication  Plan C = Crisis (instead of Plan B but only if Plan B stops working) - only use your albuterol nebulizer if you first try Plan B  Ok to try albuterol 15 min before an activity (on alternating days with inhaler vs nebulizer)  that you know would usually make you short of  breath Make sure you check your oxygen saturation  AT  your highest level of activity (not after you stop)   to be sure it stays over 90%     06/01/2022  f/u ov/Helayne Metsker re: COPD GOLD 2    maint on bevespi  Chief Complaint  Patient presents with   Follow-up    Pt states that her heart doctor changed her inhalers and she would like to talk about that.  Dyspnea:  mailbox and back s 02 ok  Cough: feels like something stuck in throat  Sleeping: bed blocks SABA use: rarely  02: prn  Covid status:   vax x 3   No obvious day to day or daytime variability or assoc excess/ purulent sputum or mucus plugs or hemoptysis or cp or chest tightness, subjective wheeze or overt sinus or hb symptoms.   Sleeps  without nocturnal  or early am exacerbation  of respiratory  c/o's or need for noct saba. Also denies any obvious fluctuation of symptoms with weather or environmental changes or other aggravating or alleviating factors except as outlined above   No unusual exposure hx or h/o childhood pna/ asthma or knowledge of premature birth.  Current Allergies, Complete Past Medical History, Past Surgical History, Family History, and Social History were reviewed in [employer] record.  ROS  The following are not active complaints unless bolded Hoarseness, sore throat, dysphagia, dental problems, itching, sneezing,  nasal congestion or discharge of excess mucus or purulent secretions, ear ache,   fever, chills, sweats, unintended wt loss or wt gain, classically pleuritic or exertional cp,  orthopnea pnd or arm/hand swelling  or leg swelling, presyncope, palpitations, abdominal pain, anorexia, nausea, vomiting, diarrhea  or change in bowel habits or change in bladder habits, change in stools or change in urine, dysuria, hematuria,  rash, arthralgias, visual complaints, headache, numbness, weakness or ataxia or problems with walking or coordination,  change in mood or  memory.        Current Meds   Medication Sig   acetaminophen (TYLENOL) 500 MG tablet Take 1,000 mg by mouth every 6 (six) hours as needed for mild pain.   albuterol (PROVENTIL) (2.5 MG/3ML) 0.083% nebulizer solution Take 3 mLs (2.5 mg total) by nebulization every 4 (four) hours as needed for wheezing or shortness of breath.   albuterol (VENTOLIN HFA) 108 (90 Base) MCG/ACT inhaler INHALE 2 PUFFS INTO THE LUNGS EVERY 4 HOURS AS NEEDED FOR AHEEZING OR SHORTNESS OF BREATH   bumetanide (BUMEX) 2 MG tablet Take 1 tablet (2 mg total) by mouth 2 (two) times daily.   cholecalciferol (VITAMIN D3) 25 MCG (1000 UNIT) tablet Take 1,000 Units by mouth daily.   diazepam (VALIUM) 5 MG tablet TAKE ONE TABLET AT BEDTIME   fluconazole (DIFLUCAN) 150 MG tablet Take one tablet by mouth and may repeat in 3-4 days as needed.   Glycopyrrolate-Formoterol (BEVESPI AEROSPHERE) 9-4.8 MCG/ACT AERO Inhale 2 puffs into the lungs 2 (two) times daily.   metolazone (ZAROXOLYN) 5 MG tablet TAKE 1 TABLET 30 MINUTES PRIOR TO BUMEX DOSE ON FRIDAYS   Multiple Vitamins-Minerals (HAIR/SKIN/NAILS/BIOTIN PO) Take 1 tablet by mouth daily.   Multiple Vitamins-Minerals (MULTIVITAMIN ADULT EXTRA C) CHEW Chew 2 tablets by mouth daily.   potassium chloride (KLOR-CON) 10 MEQ tablet Take 6 tablets (60 mEq total) by mouth 2 (two) times daily.   sacubitril-valsartan (ENTRESTO) 24-26 MG Take 1 tablet by mouth 2 (two) times daily.   warfarin (COUMADIN) 2 MG tablet Take 1 tablet daily or take as directed by anticoagulation clinic                Objective:   Physical Exam   wts    06/01/2022           136 03/02/2022          144   04/01/2021          153 07/09/2020      152  01/08/2020        145  12/20/2017        149   07/29/2016       151  03/24/15 139 lb 12.8 oz (63.413 kg)  03/20/15 141 lb (63.957 kg)  03/16/15 143 lb (64.864 kg)    Vital signs reviewed  06/01/2022  - Note at rest 02 sats  98% on RA   General appearance:  hoarse amb wf nad     HEENT : Oropharynx   clear   Nasal turbinates nl    NECK :  without  apparent JVD/ palpable Nodes/TM    LUNGS: no acc muscle use,  Min barrel  contour chest wall with bilateral  slightly decreased bs s audible wheeze and  without cough on insp or exp maneuvers and min  Hyperresonant  to  percussion bilaterally    CV:  RRR  no s3   3/6 SEM  s increase in P2, and no edema   ABD:  soft and nontender with pos end  insp Hoover's  in the supine position.  No bruits or organomegaly appreciated   MS:  Nl gait/ ext warm without deformities Or obvious joint restrictions  calf tenderness, cyanosis or clubbing     SKIN: warm and dry without lesions    NEURO:  alert, approp, nl sensorium with  no motor or cerebellar deficits apparent.                 Assessment:

## 2022-06-01 NOTE — Progress Notes (Signed)
Pt with dysuria.  Will order urinalysis with reflex to culture if indicated  [name] MD [facility]

## 2022-06-01 NOTE — Patient Instructions (Addendum)
Make sure you check your oxygen saturation  AT  your highest level of activity (not after you stop)   to be sure it stays over 90% and adjust  02 flow upward to maintain this level if needed but remember to turn it back to previous settings when you stop (to conserve your supply).   Plan A = Automatic = Always=   try reduce Breztri  to 2 each am but rinse and gargle when done   Plan B = Backup (to supplement plan A, not to replace it) Only use your albuterol inhaler as a rescue medication to be used if you can't catch your breath by resting or doing a relaxed purse lip breathing pattern.  - The less you use it, the better it will work when you need it. - Ok to use the inhaler up to 2 puffs  every 4 hours if you must but call for appointment if use goes up over your usual need - Don't leave home without it !!  (think of it like the spare tire for your car)   Plan C = Crisis (instead of Plan B but only if Plan B stops working) - only use your albuterol nebulizer if you first try Plan B and it fails to help > ok to use the nebulizer up to every 4 hours but if start needing it regularly call for immediate appointment   Please schedule a follow up visit in 3 months but call sooner if needed

## 2022-06-01 NOTE — Telephone Encounter (Signed)
Patient called to inquire taking an old cephalexin prescription because she feels like she is having a UTI now. She has been taking AZO tablets with some relief and only took one of the fluconazole tablets. She thinks this problem is separate from the yeast infection. She denies itching or changes in color of urine and only reports burning with urination. Routing to PCP to advise.

## 2022-06-01 NOTE — Assessment & Plan Note (Addendum)
Quit smoking 1977 - Spirometry 03/24/15  FEV1  1.41 (70%) ratio 65  - 03/26/2015  Walked RA x 3 laps @ 185 ft each stopped due to  End of study, brisk pace, no sob or desat  - Spirometry 07/29/2016  FEV1 1.30 (66%)  Ratio 66   - Spirometry 12/20/2017  FEV1 1.34 (69%)  Ratio 71 with min curvature p am symb 80 and saba w/in 1 hour - 12/20/2017  Walked RA x 3 laps @ 185 ft each stopped due to  End of study, moderately fast pace, no sob or desat     - 03/02/2022  After extensive coaching inhaler device,  effectiveness =    75% > continue symbiocort 80 2bid plus approp saba  - 03/02/2022   Walked on RA  x 2  lap(s) =  approx 500  ft  @ moderate pace, stopped due to tired, not sob  with lowest 02 sats 93% and HR  61  Pt is Group B in terms of symptom/risk and laba/lama therefore appropriate rx at this point >>>  bevespi reasonable rx though may be irritating throat vs effects of entresto or occult gerd  rec - The proper method of use, as well as anticipated side effects, of a metered-dose inhaler were discussed and demonstrated to the patient using teach back method  - arm and hammer toothpaste/slurry and gargle p am use - decrease bevespi to 2 each am/ the pm doses can be optional for now in absence of any pm or early am resp symptoms (which is presently the case).            Each maintenance medication was reviewed in detail including emphasizing most importantly the difference between maintenance and prns and under what circumstances the prns are to be triggered using an action plan format where appropriate.  Total time for H and P, chart review, counseling, reviewing hfa/neb/02  device(s) and generating customized AVS unique to this office visit / same day charting =32 min

## 2022-06-02 ENCOUNTER — Other Ambulatory Visit (INDEPENDENT_AMBULATORY_CARE_PROVIDER_SITE_OTHER): Payer: Medicare Other

## 2022-06-02 DIAGNOSIS — R300 Dysuria: Secondary | ICD-10-CM

## 2022-06-02 NOTE — Telephone Encounter (Signed)
Scheduled patient for lab visit for urinalysis.

## 2022-06-04 MED ORDER — CEPHALEXIN 500 MG PO CAPS: ORAL_CAPSULE | ORAL | 0 refills | Status: DC

## 2022-06-04 NOTE — Addendum Note (Signed)
Addended by: Nilda Riggs on: 06/04/2022 08:11 AM   Modules accepted: Orders

## 2022-06-08 LAB — URINE CULTURE
MICRO NUMBER:: 13885292
SPECIMEN QUALITY:: ADEQUATE

## 2022-06-08 LAB — URINALYSIS W MICROSCOPIC + REFLEX CULTURE
Bilirubin Urine: NEGATIVE
Glucose, UA: NEGATIVE
Hgb urine dipstick: NEGATIVE
Hyaline Cast: NONE SEEN /LPF
Ketones, ur: NEGATIVE
Nitrites, Initial: NEGATIVE
Protein, ur: NEGATIVE
RBC / HPF: NONE SEEN /HPF (ref 0–2)
Specific Gravity, Urine: 1.011 (ref 1.001–1.035)
Squamous Epithelial / HPF: NONE SEEN /HPF (ref <=5)
pH: 7 (ref 5.0–8.0)

## 2022-06-08 LAB — CULTURE INDICATED

## 2022-06-08 MED ORDER — LEVOFLOXACIN 500 MG PO TABS: ORAL_TABLET | ORAL | 0 refills | Status: DC

## 2022-06-08 NOTE — Addendum Note (Signed)
Addended by: Nilda Riggs on: 06/08/2022 02:18 PM   Modules accepted: Orders

## 2022-06-14 ENCOUNTER — Telehealth: Payer: Self-pay

## 2022-06-14 ENCOUNTER — Ambulatory Visit (INDEPENDENT_AMBULATORY_CARE_PROVIDER_SITE_OTHER): Payer: Medicare Other

## 2022-06-14 DIAGNOSIS — M47816 Spondylosis without myelopathy or radiculopathy, lumbar region: Secondary | ICD-10-CM | POA: Diagnosis not present

## 2022-06-14 DIAGNOSIS — E782 Mixed hyperlipidemia: Secondary | ICD-10-CM | POA: Diagnosis not present

## 2022-06-14 DIAGNOSIS — J449 Chronic obstructive pulmonary disease, unspecified: Secondary | ICD-10-CM | POA: Diagnosis not present

## 2022-06-14 DIAGNOSIS — Z5181 Encounter for therapeutic drug level monitoring: Secondary | ICD-10-CM | POA: Diagnosis not present

## 2022-06-14 DIAGNOSIS — M5416 Radiculopathy, lumbar region: Secondary | ICD-10-CM | POA: Diagnosis not present

## 2022-06-14 DIAGNOSIS — Z7901 Long term (current) use of anticoagulants: Secondary | ICD-10-CM | POA: Diagnosis not present

## 2022-06-14 LAB — PROTIME-INR
INR: 7 ratio (ref 0.8–1.0)
Prothrombin Time: 67.8 s (ref 9.6–13.1)

## 2022-06-14 LAB — POCT INR: INR: 7.1 — AB (ref 2.0–3.0)

## 2022-06-14 NOTE — Telephone Encounter (Signed)
CRITICAL VALUE STICKER  CRITICAL VALUE: Protime- 65.6, INR-6.7  RECEIVER (on-site recipient of call): Maverick Dieudonne G, CMA  DATE & TIME NOTIFIED: 1:08 pm  MESSENGER (representative from lab): Kenney Houseman  MD NOTIFIED: Via Epic  TIME OF NOTIFICATION: 1:11 pm  RESPONSE:  N/A

## 2022-06-14 NOTE — Patient Instructions (Addendum)
Pre visit review using our clinic review tool, if applicable. No additional management support is needed unless otherwise documented below in the visit note.  Hold all warfarin and recheck on 9/20

## 2022-06-14 NOTE — Telephone Encounter (Signed)
Noted  

## 2022-06-14 NOTE — Progress Notes (Addendum)
Pt reports fluconazole for a yeast infection and just finishing cephalexin x 7 days for UTI. Pt reports she has not felt well and has not been eating due to nausea.  Pt was sent for lab INR stat.  Hold all warfarin and recheck on 9/20. Pt denies any signs or symptoms of bleeding. Advised if any signs or symptoms of bleeding or abnormal bruising to go to the ER. Pt and her husband verbalized understanding.

## 2022-06-15 ENCOUNTER — Ambulatory Visit (HOSPITAL_BASED_OUTPATIENT_CLINIC_OR_DEPARTMENT_OTHER): Payer: Medicare Other | Admitting: Cardiovascular Disease

## 2022-06-15 LAB — COMPREHENSIVE METABOLIC PANEL
ALT: 13 IU/L (ref 0–32)
AST: 28 IU/L (ref 0–40)
Albumin/Globulin Ratio: 2.3 — ABNORMAL HIGH (ref 1.2–2.2)
Albumin: 4.3 g/dL (ref 3.7–4.7)
Alkaline Phosphatase: 65 IU/L (ref 44–121)
BUN/Creatinine Ratio: 15 (ref 12–28)
BUN: 30 mg/dL — ABNORMAL HIGH (ref 8–27)
Bilirubin Total: 0.6 mg/dL (ref 0.0–1.2)
CO2: 26 mmol/L (ref 20–29)
Calcium: 9.3 mg/dL (ref 8.7–10.3)
Chloride: 98 mmol/L (ref 96–106)
Creatinine, Ser: 2.02 mg/dL — ABNORMAL HIGH (ref 0.57–1.00)
Globulin, Total: 1.9 g/dL (ref 1.5–4.5)
Glucose: 97 mg/dL (ref 70–99)
Potassium: 3.4 mmol/L — ABNORMAL LOW (ref 3.5–5.2)
Sodium: 144 mmol/L (ref 134–144)
Total Protein: 6.2 g/dL (ref 6.0–8.5)
eGFR: 24 mL/min/{1.73_m2} — ABNORMAL LOW (ref >59)

## 2022-06-15 LAB — LIPID PANEL
Chol/HDL Ratio: 6.8 ratio — ABNORMAL HIGH (ref 0.0–4.4)
Cholesterol, Total: 297 mg/dL — ABNORMAL HIGH (ref 100–199)
HDL: 44 mg/dL (ref >39)
LDL Chol Calc (NIH): 195 mg/dL — ABNORMAL HIGH (ref 0–99)
Triglycerides: 295 mg/dL — ABNORMAL HIGH (ref 0–149)
VLDL Cholesterol Cal: 58 mg/dL — ABNORMAL HIGH (ref 5–40)

## 2022-06-16 ENCOUNTER — Telehealth: Payer: Self-pay

## 2022-06-16 ENCOUNTER — Ambulatory Visit (INDEPENDENT_AMBULATORY_CARE_PROVIDER_SITE_OTHER): Payer: Medicare Other

## 2022-06-16 DIAGNOSIS — Z7901 Long term (current) use of anticoagulants: Secondary | ICD-10-CM

## 2022-06-16 LAB — POCT INR: INR: 4.1 — AB (ref 2.0–3.0)

## 2022-06-16 NOTE — Telephone Encounter (Signed)
Pt seen in anticoagulation clinic today. POC INR was 4.1. Pt previously seen on 9/18 and INR was 7.1.  Instructed patient to hold dose today and take 1/2 tablet tomorrow. Then continue to take 1/2 tablet every day except 1 tablet every day on Mondays, Thursdays, and Saturdays. I'd like to recheck in 1 week.  Pt has an appointment with Dr. Oval Linsey on 9/26 at 1140. She requests to have INR checked during that visit to save another trip into [facility] for anticoagulation clinic appointment on 9/27.  I've placed orders for lab INR and a POC INR. Either would be acceptable; results can be sent to me and I'll dose patient's warfarin accordingly. Please let me know if you can accommodate this request.

## 2022-06-16 NOTE — Progress Notes (Signed)
9/20: INR 4.1. Hold dose today and take 1/2 tablet tomorrow. Then continue to take 1/2 tablet every day except 1 tablet every day on Mondays, Thursdays, and Saturdays. Recheck INR on 9/26 at cardiology appointment with Dr. Oval Linsey.

## 2022-06-16 NOTE — Patient Instructions (Signed)
Hold dose today and take 1/2 tablet tomorrow. Then continue to take 1/2 tablet every day except 1 tablet every day on Mondays, Thursdays, and Saturdays. Recheck INR on 9/26 at cardiology appointment with Dr. Oval Linsey.

## 2022-06-16 NOTE — Telephone Encounter (Signed)
Hi Jason,   We can get an in lab INR at clinic. I updated appointment notes. I would recommend she do it prior to her 11:40 AM visit as the lab closes at 12 PM. So if she could come at 11:10 to have labs then she will have plenty of time prior to visit. (Otherwise would have to wait til 1 PM when lab reopens). I've CC'd Melinda to ask her to call her with this recommendation.   Thanks,  Loel Dubonnet, NP

## 2022-06-17 ENCOUNTER — Other Ambulatory Visit (HOSPITAL_BASED_OUTPATIENT_CLINIC_OR_DEPARTMENT_OTHER): Payer: Self-pay | Admitting: *Deleted

## 2022-06-17 DIAGNOSIS — Z7901 Long term (current) use of anticoagulants: Secondary | ICD-10-CM

## 2022-06-17 NOTE — Telephone Encounter (Signed)
Advised patient, verbalized understanding  

## 2022-06-22 ENCOUNTER — Encounter (HOSPITAL_BASED_OUTPATIENT_CLINIC_OR_DEPARTMENT_OTHER): Payer: Self-pay | Admitting: Cardiovascular Disease

## 2022-06-22 ENCOUNTER — Ambulatory Visit (HOSPITAL_BASED_OUTPATIENT_CLINIC_OR_DEPARTMENT_OTHER): Payer: Medicare Other | Admitting: Cardiovascular Disease

## 2022-06-22 VITALS — BP 120/68 | HR 60 | Ht 63.0 in | Wt 133.8 lb

## 2022-06-22 DIAGNOSIS — I10 Essential (primary) hypertension: Secondary | ICD-10-CM

## 2022-06-22 DIAGNOSIS — I4811 Longstanding persistent atrial fibrillation: Secondary | ICD-10-CM | POA: Diagnosis not present

## 2022-06-22 DIAGNOSIS — N179 Acute kidney failure, unspecified: Secondary | ICD-10-CM | POA: Diagnosis not present

## 2022-06-22 DIAGNOSIS — Z5181 Encounter for therapeutic drug level monitoring: Secondary | ICD-10-CM

## 2022-06-22 DIAGNOSIS — Z7901 Long term (current) use of anticoagulants: Secondary | ICD-10-CM | POA: Diagnosis not present

## 2022-06-22 DIAGNOSIS — I5032 Chronic diastolic (congestive) heart failure: Secondary | ICD-10-CM

## 2022-06-22 HISTORY — DX: Acute kidney failure, unspecified: N17.9

## 2022-06-22 LAB — POCT INR: INR: 2.5 (ref 2.0–3.0)

## 2022-06-22 MED ORDER — BUMETANIDE 2 MG PO TABS: 2.0000 mg | ORAL_TABLET | Freq: Every day | ORAL | 3 refills | Status: DC

## 2022-06-22 MED ORDER — METOLAZONE 5 MG PO TABS: ORAL_TABLET | ORAL | 0 refills | Status: DC

## 2022-06-22 NOTE — Assessment & Plan Note (Signed)
Volume status has been challenging to manage.  Renal function was worse when last checked a week ago.  She is now having nausea and appears to be somewhat dry on exam.  We will have her reduce the Bumex to once daily.  Metolazone was being taken once per week.  Now she will only take it as needed for edema, shortness of breath, or other heart failure symptoms.  Check a BMP today and again in 1 week.  Check blood pressures and heart rates at home.

## 2022-06-22 NOTE — Progress Notes (Signed)
Cardiology Office Note  Date:  06/22/2022   ID:  Catherine [name], DOB [birth year], MRN [id number]  PCP:  [name], MD  Cardiologist:   [name], MD  Cardiothoracic surgeon: Dr. Roxy Manns  No chief complaint on file.  History of Present Illness: Catherine [name] is a 87 y.o. 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 Catherine [name] was previously a patient of [name]. She underwent mitral commissurotomy in the 1970s. On her echocardiogram 02/2014 she had an ejection fraction of 60-65% with moderate to severe mitral regurgitation. There was also mild aortic regurgitation. Her follow-up echo 02/2015 showed an EF of 60-65% with moderate to severe mitral regurgitation, moderate tricuspid regurgitation and elevated pulmonary artery pressures.  Repeat echo 9/11/7 revealed moderate pulmonary stenosis with moderate to severe mitral regurgitation.  She did not have any pulmonary hypertension and her LV was not dilated.  She was referred for left and right heart catheterization. She was found to have severe mitral stenosis with a 13 mmHg mean gradient, MVA 0.99 cm^2. She also had 50% LCx and 80% RCA lesions.  She had a 29 mm bioprosthetic mitral valve, single vessel CABG and MAZE with Dr. Roxy Manns on 10/14/16.  She was referred for a baseline echo 02/15/17 that revealed LVEF 60-65% with mild aortic stenosis (mean gradient 12 mmHg).  Her bioprosthetic mitral valve was functioning well.    After her surgery Catherine [name] continued to report fatigue.  It was unclear whether she was in atrial fibrillation or sinus rhythm with a long PR interval.  She was referred to the atrial fibrillation clinic where this remained unclear.  However, it was felt that if she were in atrial fibrillation it would not be possible to maintain sinus rhythm.  She  developed lower extremity edema. She stopped amlodipine and this improved. This was switched to irbesartan. She then developed diaphoresis and insomnia that she attributed to this medication. She was switched from lasix to torsemide which helped her abdominal bloating but she still had LE edema. She has followed up with Catherine Montana NP and noted increased LE edema and hip pain. She also reported dizziness and had a repeat echo 06/2021 with LV 60-65%. Her bioprosthetic mitral valve was functioning properly. She had moderate to severe tricuspid regurgitation and PASP was 59mHg.   She reported some shortness of breath that improved with torsemide. Her at home blood pressure was ranging 110s-150s/50s-60s, averaging in the 120s-130s. On 10/25/2021 she presented to the ED with complaints of fatigue, cough, nausea, fever, congestion, and headache. Her symptoms were thought to have a viral etiology. She was later admitted to the hospital 10/29/2021 and was found to be hypoxic to 84% with activity, BNP 234, and a chest CT revealed right middle lobe atelectasis and small pleural effusions. 2D echo noted preserved EF. She had moderate TR and her bioprosthetic mitral valve was stable. She also reported right lateral chest wall pain with upper back pain. It was suspected she had a COPD exacerbation with mild component of diastolic CHF.   She was hospitalized 12/2020 with acute on chronic diastolic heart failure and COPD.  She was diuresed with IV Lasix and started on low-dose losartan.  In the hospital she was noted to have episodes of A-fib with bradycardia.  Her heart rates dropped into the 40s and she had up to 2.4-second pauses  on metoprolol.  Her metoprolol dose was reduced.  She followed up in advanced heart failure clinic 12/2021 and was feeling much better.  She saw Catherine Montana, NP 01/2022 and her dyspnea was improving.  She was started on amiodarone. It made her feel poorly, she said her hair was falling out, and it  affected her coumadin levels. Amiodarone was discontinued. Torsemide was switched to Bumex. :Losartan was switched to [employer]. She followed up with Catherine Montana, NP and it was noted that instead of Bumex the pharmacist gave her budesonide. She was referred to the PREP program, and followed up with [facility] the following month. Her weight was down 8 lbs. She had a good response to metolazone, so it was added once per week.   Today, she is accompanied by a family member. Recently she suffered from a severe UTI requiring multiple antibiotics. For the past 2-3 weeks she has been struggling with persistent nausea. This worsened after trying to take Tums. In the mornings her voice is hoarse. Usually she is not feeling very hungry so she doesn't eat much; she is drinking plenty of water. She presents a BP log today. On personal review this shows that her BP was previously in the 01U systolic, but has lately been ranging 100s-110s more frequently. She confirms having mild dizziness and lightheadedness, especially when getting up from a seated position too quickly. Also she sometimes staggers a little bit. Currently she is taking her Bumex around 10 AM and again around 4-5 PM. At night she is waking up 5-6 times to use the restroom. Since her last visit she is down 3-4 lbs. Additionally she complains of back and sciatic issues. Due to her pain she has not been walking for exercise. She denies any palpitations, chest pain, shortness of breath, or peripheral edema. No headaches, syncope, orthopnea, or PND.   Past Medical History:  Diagnosis Date   Acute on chronic diastolic heart failure ([place])    AKI (acute kidney injury) ([place]) 06/22/2022   Aortic stenosis, mild 07/26/2017   Arthritis    Asthma    last attack 02/2015   Atrial fibrillation, chronic (HCC)    Cellulitis of left lower extremity 08/17/2016   Ulcer associated with severe venous insufficiency   Chronic anticoagulation    Chronic diastolic CHF  (congestive heart failure) ([place])    Chronic kidney disease    "RIGHT MANY KIDNEY INFECTIONS AND STONES"   COPD (chronic obstructive pulmonary disease) (HCC)    Coronary artery disease    Dizziness    H/O: rheumatic fever    Heart murmur    Hypertension    PONV (postoperative nausea and vomiting)    ' SOMETIMES', BUT NOT ALWAYS"   S/P Maze operation for atrial fibrillation 10/14/2016   Complete bilateral atrial lesion set using cryothermy and bipolar radiofrequency ablation - atrial appendage was not treated due to previous surgical procedure (open mitral commissurotomy)   S/P mitral valve replacement with bioprosthetic valve 10/14/2016   29 mm Medtronic Mosaic porcine bioprosthetic tissue valve   Tricuspid regurgitation 07/14/2021   UTI (urinary tract infection)    Valvular heart disease    Has mitral stenosis with prior mitral commissurotomy in 1970    Past Surgical History:  Procedure Laterality Date   ABDOMINAL HYSTERECTOMY  1983   endometriosis   APPENDECTOMY     BACK SURGERY     neurosurgery x2   CARDIAC CATHETERIZATION     CARDIAC CATHETERIZATION N/A 08/03/2016   Procedure: Right/Left Heart Cath  and Coronary Angiography;  Surgeon: Peter M Martinique, MD;  Location: [facility];  Service: Cardiovascular;  Laterality: N/A;   cataract surg     CHOLECYSTECTOMY N/A 05/30/2015   Procedure: LAPAROSCOPIC CHOLECYSTECTOMY WITH INTRAOPERATIVE CHOLANGIOGRAM;  Surgeon: Excell Seltzer, MD;  Location: [facility];  Service: General;  Laterality: N/A;   COLONOSCOPY     CORONARY ARTERY BYPASS GRAFT N/A 10/14/2016   Procedure: CORONARY ARTERY BYPASS GRAFTING (CABG);  Surgeon: Rexene Alberts, MD;  Location: [place];  Service: Open Heart Surgery;  Laterality: N/A;   EYE SURGERY     MAZE N/A 10/14/2016   Procedure: MAZE;  Surgeon: Rexene Alberts, MD;  Location: [facility];  Service: Open Heart Surgery;  Laterality: N/A;   MITRAL VALVE REPLACEMENT N/A 10/14/2016   Procedure: REDO MITRAL VALVE REPLACEMENT  (MVR);  Surgeon: Rexene Alberts, MD;  Location: [facility];  Service: Open Heart Surgery;  Laterality: N/A;   MITRAL VALVE SURGERY Left 1970   Open mitral commissurotomy via left thoracotomy approach   TEE WITHOUT CARDIOVERSION N/A 07/05/2016   Procedure: TRANSESOPHAGEAL ECHOCARDIOGRAM (TEE);  Surgeon: [name], MD;  Location: [place] [facility];  Service: Cardiovascular;  Laterality: N/A;   TEE WITHOUT CARDIOVERSION N/A 10/14/2016   Procedure: TRANSESOPHAGEAL ECHOCARDIOGRAM (TEE);  Surgeon: Rexene Alberts, MD;  Location: [facility];  Service: Open Heart Surgery;  Laterality: N/A;    Current Outpatient Medications  Medication Sig Dispense Refill   acetaminophen (TYLENOL) 500 MG tablet Take 1,000 mg by mouth every 6 (six) hours as needed for mild pain.     albuterol (PROVENTIL) (2.5 MG/3ML) 0.083% nebulizer solution Take 3 mLs (2.5 mg total) by nebulization every 4 (four) hours as needed for wheezing or shortness of breath. 75 mL 2   albuterol (VENTOLIN HFA) 108 (90 Base) MCG/ACT inhaler INHALE 2 PUFFS INTO THE LUNGS EVERY 4 HOURS AS NEEDED FOR AHEEZING OR SHORTNESS OF BREATH 8.5 g 2   cholecalciferol (VITAMIN D3) 25 MCG (1000 UNIT) tablet Take 1,000 Units by mouth daily.     diazepam (VALIUM) 5 MG tablet TAKE ONE TABLET AT BEDTIME 30 tablet 5   Glycopyrrolate-Formoterol (BEVESPI AEROSPHERE) 9-4.8 MCG/ACT AERO Inhale 2 puffs into the lungs 2 (two) times daily. 3 each 3   Multiple Vitamins-Minerals (HAIR/SKIN/NAILS/BIOTIN PO) Take 1 tablet by mouth daily.     Multiple Vitamins-Minerals (MULTIVITAMIN ADULT EXTRA C) CHEW Chew 2 tablets by mouth daily.     potassium chloride (KLOR-CON) 10 MEQ tablet Take 40 mEq by mouth 2 (two) times daily.     sacubitril-valsartan (ENTRESTO) 24-26 MG Take 1 tablet by mouth 2 (two) times daily. 180 tablet 3   warfarin (COUMADIN) 2 MG tablet Take 1 tablet daily or take as directed by anticoagulation clinic 100 tablet 1   bumetanide (BUMEX) 2 MG tablet Take 1 tablet (2 mg  total) by mouth daily. 30 tablet 3   metolazone (ZAROXOLYN) 5 MG tablet TAKE 1 TABLET 30 MINUTES PRIOR TO BUMEX DOSE AS NEEDED FOR SWELLING 5 tablet 0   No current facility-administered medications for this visit.    Allergies:   Aldactone [spironolactone], Amoxicillin, Diltiazem, Flagyl [metronidazole hcl], Flovent [fluticasone propionate], Gabapentin, Lyrica [pregabalin], Quinidine, Simvastatin, Tramadol, Verapamil, Amlodipine, Crestor [rosuvastatin], Livalo [pitavastatin], Zetia [ezetimibe], Benazepril hcl, Ciprofloxacin, Codeine, and Nitrofurantoin monohyd macro    Social History:  The patient  reports that she quit smoking about 46 years ago. Her smoking use included cigarettes. She has a 15.00 pack-year smoking history. She has never used smokeless  tobacco. She reports that she does not drink alcohol and does not use drugs.   Family History:  The patient's family history includes Leukemia in her father.   ROS:   Please see the history of present illness.   (+) Urinary Frequency/Nocturia (+) Back pain (+) Dizziness/Lightheadedness (+) Nausea (+) Hoarseness of voice All other systems are reviewed and negative.   PHYSICAL EXAM:  VS:  BP 120/68 (BP Location: Right Arm, Patient Position: Sitting, Cuff Size: Normal)   Pulse 60   Ht '5\' 3"'$  (1.6 m)   Wt 133 lb 12.8 oz (60.7 kg)   SpO2 97%   BMI 23.70 kg/m  , BMI Body mass index is 23.7 kg/m. GENERAL:  Well appearing HEENT: Pupils equal round and reactive, fundi not visualized, oral mucosa unremarkable NECK:  No jugular venous distention, waveform within normal limits, carotid upstroke brisk and symmetric, no bruits LUNGS:  Clear to auscultation bilaterally HEART:  RRR.  PMI not displaced or sustained,S1 and S2 within normal limits, no S3, no S4, no clicks, no rubs,  III/VI systolic murmur at the LUSB ABD:   Abdomen flat. Positive bowel sounds normal in frequency in pitch, no bruits, no rebound, no guarding, no midline pulsatile  mass, no hepatomegaly, no splenomegaly EXT:  2 plus pulses throughout,  no edema, no cyanosis no clubbing SKIN:  L LE anterior tibia chronic stasis dermatitis NEURO:  Cranial nerves II through XII grossly intact, motor grossly intact throughout PSYCH:  Cognitively intact, oriented to person place and time  EKG:  EKG is personally reviewed. 06/22/2022:  EKG was not ordered. 11/19/2021: EKG was not ordered. 07/14/2021:  No EKG was ordered. 07/21/20: Atrial fibrillation.  Rate 70 bpm.  Prior septal infarct. 01/10/20: Atrial fibrillation. Rate 56 bpm.  Prior septal infarct. 06/06/19: Atrial fibrillation.  Rate 61 bpm.  Prior septal infarct. 10/10/18: Sinus rhythm.  Rate 89 bpm.  Nonspecific ST changes.  01/05/18: Accelerated junctional rhythm.  Rate 68 bpm.  Incomplete RBBB.  04/26/17: Atrial fibrillation.  Rate 69 bpm. 03/08/17: Sinus bradycardia with sinus arrhythmia.   First degree heart block.  Junctional escape.  QTc 497 ms.  02/01/17: Sinus bradycardia with sinus arrhythmia and first degree block.  10/27/16: Sinus rhythm.  Rate 60 bpm.  First degree heart block. 05/21/16: atrial fibrillateion.  Prior septal infarct.  Rate 60 bpm.  Unchanged from 10/22/15.   Echo 10/30/2021:  1. Left ventricular ejection fraction, by estimation, is 60 to 65%. The  left ventricle has normal function. The left ventricle has no regional  wall motion abnormalities. Left ventricular diastolic parameters are  indeterminate.   2. Right ventricular systolic function is mildly reduced. The right  ventricular size is mildly enlarged. There is moderately elevated  pulmonary artery systolic pressure.   3. Left atrial size was moderately dilated.   4. Right atrial size was moderately dilated.   5. Normal appearing bioprosthetic MVR no PVL low mean gradient at HR 58  bpm Stent struts protrude into LVOT but no obvious sub valvular gradients  . The mitral valve has been repaired/replaced. No evidence of mitral valve   regurgitation. No evidence of mitral stenosis.   6. Tricuspid valve regurgitation is moderate.   7. The aortic valve is tricuspid. There is moderate calcification of the  aortic valve. There is moderate thickening of the aortic valve. Aortic  valve regurgitation is not visualized. Mild aortic valve stenosis.   8. The inferior vena cava is normal in size with greater than 50%  respiratory variability, suggesting right atrial pressure of 3 mmHg.   CTA Chest 10/29/2021: COMPARISON:  Chest x-ray from earlier in the same day.   FINDINGS: Cardiovascular: Atherosclerotic calcifications of the thoracic aorta are noted. Changes consistent with prior coronary bypass grafting are noted. No aneurysmal dilatation is seen. Heart is at the upper limits of normal in size. The pulmonary artery shows a normal branching pattern. No intraluminal filling defect to suggest pulmonary embolism is seen. Mild coronary calcifications are noted.   Mediastinum/Nodes: Thoracic inlet is within normal limits. No sizable hilar or mediastinal adenopathy is noted. The esophagus as visualized is within normal limits.   Lungs/Pleura: Mild right middle lobe atelectatic changes are noted. Small pleural effusions are noted bilaterally. No sizable parenchymal nodules are seen. No pneumothorax is noted.   Upper Abdomen: Visualized upper abdomen is within normal limits.   Musculoskeletal: Postsurgical changes in the cervical spine are seen. No acute bony abnormality is noted.   Review of the MIP images confirms the above findings.   IMPRESSION: No evidence of pulmonary emboli.   Right middle lobe atelectatic changes.   Small pleural effusions are seen.   Aortic Atherosclerosis (ICD10-I70.0).  Echo 07/07/2021 1. Left ventricular ejection fraction, by estimation, is 60 to 65%. The  left ventricle has normal function. The left ventricle has no regional  wall motion abnormalities. Left ventricular diastolic function  could not  be evaluated.   2. Right ventricular systolic function is mildly reduced. The right  ventricular size is mildly enlarged. There is mildly elevated pulmonary  artery systolic pressure. The estimated right ventricular systolic  pressure is 16.1 mmHg.   3. Left atrial size was severely dilated.   4. Right atrial size was severely dilated.   5. 29 mm Medtronic Mosaic Bioprosthesis in mitral position (10/14/2016).  MG 5.5 @ 55 bpm. E max 2.1 m/s, PHT ~120 msec, EOA 2.1 cm2, DI 1.36. All  parameters are within normal limits for this prosthetic valve. The mitral  valve has been repaired/replaced.  No evidence of mitral valve regurgitation. Procedure Date: 10/14/2016.   6. There is at least moderate TR. there is systolic flow reversal in the  hepatic veins suggestive of significant TR. Interrogation poor on this  study. Tricuspid valve regurgitation is moderate to severe.   7. The aortic valve is tricuspid. There is mild calcification of the  aortic valve. There is mild thickening of the aortic valve. Aortic valve  regurgitation is not visualized. Mild to moderate aortic valve  sclerosis/calcification is present, without any  evidence of aortic stenosis.   8. The inferior vena cava is dilated in size with >50% respiratory  variability, suggesting right atrial pressure of 8 mmHg.   Echo 09/2019: 1. Left ventricular ejection fraction, by visual estimation, is 65 to  70%. The left ventricle has hyperdynamic function. Left ventricular septal  wall thickness was mildly increased. There is mildly increased left  ventricular hypertrophy.   2. The left ventricle has no regional wall motion abnormalities.   3. Global right ventricle has normal systolic function.The right  ventricular size is normal. No increase in right ventricular wall  thickness.   4. Left atrial size was moderately dilated.   5. Right atrial size was moderately dilated.   6. The mitral valve has been repaired/replaced.  No evidence of mitral  valve regurgitation. No evidence of mitral stenosis.   7. Patient has a 29 mm biprosthetic valve functioning normally with no  PVL The mitral struts protrude into the  LVOT quite a bit likely  contributing to some of the gradient across the LVOT/AV. Creates a small  "neointimal LVOT" area.   8. The tricuspid valve is normal in structure.   9. The aortic valve is tricuspid. Aortic valve regurgitation is not  visualized. Mild aortic valve stenosis.  10. Pulmonic regurgitation is mild.  11. The pulmonic valve was grossly normal. Pulmonic valve regurgitation is  mild.  12. Mildly elevated pulmonary artery systolic pressure.   Echo 07/06/18: Study Conclusions   - Left ventricle: The cavity size was normal. Systolic function was   vigorous. The estimated ejection fraction was in the range of 65%   to 70%. Wall motion was normal; there were no regional wall   motion abnormalities. Doppler parameters are consistent with high   ventricular filling pressure. - Aortic valve: Trileaflet; mildly thickened, mildly calcified   leaflets. There was mild stenosis. Peak velocity (S): 266 cm/s.   Mean gradient (S): 16 mm Hg. - Mitral valve: A bioprosthesis was present and functioning   normally. - Left atrium: The atrium was severely dilated. Anterior-posterior   dimension: 60 mm. - Tricuspid valve: There was mild regurgitation.  7 Day Event Monitor 06/28/17:   Quality: Fair.  Baseline artifact. Predominant rhythm: Sinus rhythm with PACs and sinus arrhythmia.     Cannot  rule out episodes of atrial fibrillation.  Carotid Dopplers 10/12/16: 1-39% bilateral ICA stenosis  LHC/RHC 08/03/16: The left ventricular systolic function is normal. LV end diastolic pressure is normal. The left ventricular ejection fraction is 55-65% by visual estimate. There is no aortic valve stenosis. There is severe mitral valve stenosis. Prox Cx to Mid Cx lesion, 50 %stenosed. Mid RCA-2 lesion,  80 %stenosed. Mid RCA-1 lesion, 80 %stenosed. Hemodynamic findings consistent with mild pulmonary hypertension. LV end diastolic pressure is normal.   1. Coronary artery disease   - 50% mid LCx   - 80% sequential lesions in the mid RCA 2. Normal LV function 3. Severe mitral stenosis. MV gradient of 13 mm Hg. MVA 0.99 cm squared with index 0.57. 4. Moderate to severe mitral insufficiency 5. Mild pulmonary HTN. 6. Normal LV EDP 7. No significant AV gradient 8. Occluded right brachial artery.   Recent Labs: 01/05/2022: TSH 1.716 04/19/2022: BNP 133.2; Hemoglobin 12.6; Magnesium 1.7; Platelets 185 06/14/2022: ALT 13; BUN 30; Creatinine, Ser 2.02; Potassium 3.4; Sodium 144    Lipid Panel    Component Value Date/Time   CHOL 297 (H) 06/14/2022 1032   TRIG 295 (H) 06/14/2022 1032   HDL 44 06/14/2022 1032   CHOLHDL 6.8 (H) 06/14/2022 1032   CHOLHDL 2.8 07/09/2020 1403   VLDL 42.8 (H) 09/24/2019 1054   LDLCALC 195 (H) 06/14/2022 1032   LDLCALC 77 07/09/2020 1403   LDLDIRECT 68.0 09/24/2019 1054      Wt Readings from Last 3 Encounters:  06/22/22 133 lb 12.8 oz (60.7 kg)  06/01/22 136 lb 12.8 oz (62.1 kg)  05/28/22 135 lb 9.6 oz (61.5 kg)     ASSESSMENT AND PLAN:  Atrial fibrillation (HCC) Currently maintaining sinus rhythm.  She did not tolerate amiodarone.  Continue warfarin.  Essential hypertension Blood pressure is very well controlled.  In fact it has been running low.  I am concerned that she is dry.  Continue Entresto and reducing Bumex and metolazone.  Continue Farxiga.  AKI (acute kidney injury) ([place]) Volume status has been challenging to manage.  Renal function was worse when last checked a week ago.  She is now  having nausea and appears to be somewhat dry on exam.  We will have her reduce the Bumex to once daily.  Metolazone was being taken once per week.  Now she will only take it as needed for edema, shortness of breath, or other heart failure symptoms.  Check a BMP  today and again in 1 week.  Check blood pressures and heart rates at home.   Disposition:   FU with Malissia Rabbani C. Oval Linsey, MD, [facility] in 1 month.  Medication Adjustments/Labs and Tests Ordered: Current medicines are reviewed at length with the patient today.  Concerns regarding medicines are outlined above.   Orders Placed This Encounter  Procedures   Basic metabolic panel   Basic metabolic panel   Meds ordered this encounter  Medications   metolazone (ZAROXOLYN) 5 MG tablet    Sig: TAKE 1 TABLET 30 MINUTES PRIOR TO BUMEX DOSE AS NEEDED FOR SWELLING    Dispense:  5 tablet    Refill:  0    D/C PREVIOUS RX   bumetanide (BUMEX) 2 MG tablet    Sig: Take 1 tablet (2 mg total) by mouth daily.    Dispense:  30 tablet    Refill:  3    New dose, d/c previous rx   Patient Instructions  Medication Instructions:  DECREASE YOUR BUMEX TO ONCE A DAY   ONLY TAKE METOLAZONE 1 TABLET AS NEED, NOT EVERY WEEK   Labwork: BMET TODAY   BMET IN 1 WEEK   Testing/Procedures: NONE   Follow-Up: 07/21/2022 10:00 AM WITH DR Jessamine   I,Mathew Stumpf,acting as a scribe for [name], MD.,have documented all relevant documentation on the behalf of [name], MD,as directed by  [name], MD while in the presence of [name], MD.  I, Clawson Oval Linsey, MD have reviewed all documentation for this visit.  The documentation of the exam, diagnosis, procedures, and orders on 06/22/2022 are all accurate and complete.   Signed, Abbeygail Igoe C. Oval Linsey, MD, [facility]  06/22/2022 1:14 PM    [facility]

## 2022-06-22 NOTE — Assessment & Plan Note (Signed)
Blood pressure is very well controlled.  In fact it has been running low.  I am concerned that she is dry.  Continue Entresto and reducing Bumex and metolazone.  Continue Farxiga.

## 2022-06-22 NOTE — Patient Instructions (Addendum)
Medication Instructions:  DECREASE YOUR BUMEX TO ONCE A DAY   ONLY TAKE METOLAZONE 1 TABLET AS NEED, NOT EVERY WEEK   Labwork: BMET TODAY   BMET IN 1 WEEK   Testing/Procedures: NONE   Follow-Up: 07/21/2022 10:00 AM WITH DR [facility]

## 2022-06-22 NOTE — Assessment & Plan Note (Signed)
Currently maintaining sinus rhythm.  She did not tolerate amiodarone.  Continue warfarin.

## 2022-06-23 ENCOUNTER — Ambulatory Visit (INDEPENDENT_AMBULATORY_CARE_PROVIDER_SITE_OTHER): Payer: Medicare Other

## 2022-06-23 DIAGNOSIS — Z7901 Long term (current) use of anticoagulants: Secondary | ICD-10-CM | POA: Diagnosis not present

## 2022-06-23 LAB — BASIC METABOLIC PANEL
BUN/Creatinine Ratio: 19 (ref 12–28)
BUN: 28 mg/dL — ABNORMAL HIGH (ref 8–27)
CO2: 23 mmol/L (ref 20–29)
Calcium: 10.5 mg/dL — ABNORMAL HIGH (ref 8.7–10.3)
Chloride: 98 mmol/L (ref 96–106)
Creatinine, Ser: 1.44 mg/dL — ABNORMAL HIGH (ref 0.57–1.00)
Glucose: 99 mg/dL (ref 70–99)
Potassium: 3.9 mmol/L (ref 3.5–5.2)
Sodium: 145 mmol/L — ABNORMAL HIGH (ref 134–144)
eGFR: 36 mL/min/{1.73_m2} — ABNORMAL LOW (ref >59)

## 2022-06-23 LAB — PROTIME-INR
INR: 2.5 — ABNORMAL HIGH (ref 0.9–1.2)
Prothrombin Time: 25.2 s — ABNORMAL HIGH (ref 9.1–12.0)

## 2022-06-23 NOTE — Patient Instructions (Signed)
Increase dose today to 1 tablet, then continue to take 1/2 tablet every day except take 1 tablet every day on Mondays, Thursdays, and Saturdays. Recheck INR at [facility] on 07/07/22 at 12:00.

## 2022-06-23 NOTE — Progress Notes (Signed)
I have reviewed and agree with note, evaluation, plan.   Churchill Grimsley, MD  

## 2022-06-23 NOTE — Progress Notes (Signed)
Called patient to inform her that INR from yesterday's labs at cardiology was 2.5. Pt states she did not take warfarin at all yesterday.   Pt instructed to increase dose today to 1 tablet, then continue to take 1/2 tablet every day except take 1 tablet a day on Mondays, Thursdays, and Saturdays. Recheck INR at [facility] on 07/07/22 at 12:00.

## 2022-06-26 DIAGNOSIS — J449 Chronic obstructive pulmonary disease, unspecified: Secondary | ICD-10-CM | POA: Diagnosis not present

## 2022-06-26 DIAGNOSIS — I4891 Unspecified atrial fibrillation: Secondary | ICD-10-CM | POA: Diagnosis not present

## 2022-06-26 DIAGNOSIS — I5030 Unspecified diastolic (congestive) heart failure: Secondary | ICD-10-CM

## 2022-06-26 DIAGNOSIS — E785 Hyperlipidemia, unspecified: Secondary | ICD-10-CM | POA: Diagnosis not present

## 2022-06-26 DIAGNOSIS — I110 Hypertensive heart disease with heart failure: Secondary | ICD-10-CM | POA: Diagnosis not present

## 2022-06-29 DIAGNOSIS — Z789 Other specified health status: Secondary | ICD-10-CM | POA: Diagnosis not present

## 2022-06-29 DIAGNOSIS — L298 Other pruritus: Secondary | ICD-10-CM | POA: Diagnosis not present

## 2022-06-29 DIAGNOSIS — L538 Other specified erythematous conditions: Secondary | ICD-10-CM | POA: Diagnosis not present

## 2022-06-29 DIAGNOSIS — R208 Other disturbances of skin sensation: Secondary | ICD-10-CM | POA: Diagnosis not present

## 2022-06-29 DIAGNOSIS — L820 Inflamed seborrheic keratosis: Secondary | ICD-10-CM | POA: Diagnosis not present

## 2022-06-29 DIAGNOSIS — I10 Essential (primary) hypertension: Secondary | ICD-10-CM | POA: Diagnosis not present

## 2022-06-29 DIAGNOSIS — Z5181 Encounter for therapeutic drug level monitoring: Secondary | ICD-10-CM | POA: Diagnosis not present

## 2022-06-29 LAB — BASIC METABOLIC PANEL
BUN/Creatinine Ratio: 15 (ref 12–28)
BUN: 21 mg/dL (ref 8–27)
CO2: 28 mmol/L (ref 20–29)
Calcium: 9.5 mg/dL (ref 8.7–10.3)
Chloride: 100 mmol/L (ref 96–106)
Creatinine, Ser: 1.4 mg/dL — ABNORMAL HIGH (ref 0.57–1.00)
Glucose: 114 mg/dL — ABNORMAL HIGH (ref 70–99)
Potassium: 3.1 mmol/L — ABNORMAL LOW (ref 3.5–5.2)
Sodium: 143 mmol/L (ref 134–144)
eGFR: 37 mL/min/{1.73_m2} — ABNORMAL LOW (ref >59)

## 2022-06-30 ENCOUNTER — Telehealth: Payer: Self-pay | Admitting: Pharmacist

## 2022-06-30 ENCOUNTER — Telehealth: Payer: Self-pay

## 2022-06-30 NOTE — Chronic Care Management (AMB) (Signed)
    Chronic Care Management Pharmacy Assistant   Name: Catherine [name]  MRN: [id number] DOB: 1934-12-04  Reason for Encounter: Reschedule 07/30/2022 appointment with [name], Clinical Pharmacist.    Rescheduled with patient to 08/24/2022.  [place] Pharmacist Assistant [phone]

## 2022-06-30 NOTE — Telephone Encounter (Signed)
Pt called stating that she has a back injection scheduled for 07/05/22. Anticoagulation clinic appointment rescheduled to 07/12/22 at 11:30.

## 2022-07-05 ENCOUNTER — Ambulatory Visit: Payer: Medicare Other

## 2022-07-05 ENCOUNTER — Telehealth: Payer: Self-pay

## 2022-07-05 DIAGNOSIS — M5416 Radiculopathy, lumbar region: Secondary | ICD-10-CM | POA: Diagnosis not present

## 2022-07-05 NOTE — Telephone Encounter (Signed)
Pt called to verify warfarin dosing instructions. She received an epidural back injection today and has held warfarin for the past 5 days.  The below instructions were given to the patient and verbalized understanding:  10/9: take 1  (3 mg total) tablets  10/10: take 1 ('2mg'$ ) tablet 10/11:  take 1 ('2mg'$ ) tablet 10/12: take 1 ('2mg'$ ) tablet 10/13: take  ('1mg'$ ) tablet 10/14: take 1 ('2mg'$ ) tablet 10/15: take  ('1mg'$ ) tablet  10/16: return for INR check. No warfarin until after recheck at anticoagulation clinic.

## 2022-07-07 ENCOUNTER — Telehealth (HOSPITAL_BASED_OUTPATIENT_CLINIC_OR_DEPARTMENT_OTHER): Payer: Self-pay | Admitting: Cardiovascular Disease

## 2022-07-07 ENCOUNTER — Ambulatory Visit: Payer: Medicare Other

## 2022-07-07 NOTE — Telephone Encounter (Signed)
Pt c/o swelling: STAT is pt has developed SOB within 24 hours  If swelling, where is the swelling located? Ankles  How much weight have you gained and in what time span?  5 lbs in 6 days   Have you gained 3 pounds in a day or 5 pounds in a week? Yes  Do you have a log of your daily weights (if so, list)?  10/05 131 lbs 10/06 132 lbs 10/07 134 lbs 10/08 134 lbs 10/09 133 lbs 10/10 135 lbs 10/11 136 lbs  Are you currently taking a fluid pill? Yes  Are you currently SOB? No   Have you traveled recently? No    Patient is requesting Rip Harbour give her a callback regarding this.

## 2022-07-07 NOTE — Telephone Encounter (Signed)
Verbal from [name], NP   " Take Metolazone 30 minutes prior to Bumex tomorrow"   Advised patient to take Metolazone tomorrow 30 minutes. Patient states she has been taking her Bumex twice daily but unclear how many days and then states she has been taking one tablet a day since 9/26.   Advised patient to call on Friday with her updated weight.

## 2022-07-09 ENCOUNTER — Telehealth (HOSPITAL_BASED_OUTPATIENT_CLINIC_OR_DEPARTMENT_OTHER): Payer: Self-pay | Admitting: Cardiovascular Disease

## 2022-07-09 NOTE — Telephone Encounter (Signed)
Seems extra dose of Metolazone worked well. Continue present dosing of Metolazone once per week.  Loel Dubonnet, NP

## 2022-07-09 NOTE — Telephone Encounter (Signed)
Advised patient, verbalized understanding  Number give for [facility] to reorder [facility] when she needs filled

## 2022-07-09 NOTE — Telephone Encounter (Signed)
Patient called to say that she down 4lbs since Wednesday

## 2022-07-12 ENCOUNTER — Ambulatory Visit (INDEPENDENT_AMBULATORY_CARE_PROVIDER_SITE_OTHER): Payer: Medicare Other

## 2022-07-12 DIAGNOSIS — Z7901 Long term (current) use of anticoagulants: Secondary | ICD-10-CM | POA: Diagnosis not present

## 2022-07-12 LAB — POCT INR: INR: 1.8 — AB (ref 2.0–3.0)

## 2022-07-12 NOTE — Patient Instructions (Signed)
Pt instructed to increase dose today to 1 1/2 tablet, then continue to take 1/2 tablet every day except take 1 tablet every day on Mondays, Thursdays, and Saturdays. Recheck INR at [facility] on 08/04/22 at 12:45.

## 2022-07-12 NOTE — Progress Notes (Signed)
Pt had steroid injection in back on 10/9. Was on 5 day warfarin hold. Back today for INR check.   Pt instructed to increase dose today to 1 1/2 tablet, then continue to take 1/2 tablet every day except take 1 tablet every day on Mondays, Thursdays, and Saturdays. Recheck INR at [facility] on 08/04/22 at 12:45.

## 2022-07-14 DIAGNOSIS — J449 Chronic obstructive pulmonary disease, unspecified: Secondary | ICD-10-CM | POA: Diagnosis not present

## 2022-07-16 ENCOUNTER — Ambulatory Visit (INDEPENDENT_AMBULATORY_CARE_PROVIDER_SITE_OTHER): Payer: Medicare Other | Admitting: Family Medicine

## 2022-07-16 VITALS — BP 120/60 | HR 63 | Temp 97.9°F | Wt 139.8 lb

## 2022-07-16 DIAGNOSIS — Z23 Encounter for immunization: Secondary | ICD-10-CM

## 2022-07-16 DIAGNOSIS — R350 Frequency of micturition: Secondary | ICD-10-CM | POA: Diagnosis not present

## 2022-07-16 LAB — POC URINALSYSI DIPSTICK (AUTOMATED)
Bilirubin, UA: NEGATIVE
Blood, UA: NEGATIVE
Glucose, UA: NEGATIVE
Ketones, UA: NEGATIVE
Leukocytes, UA: NEGATIVE
Nitrite, UA: NEGATIVE
Protein, UA: NEGATIVE
Spec Grav, UA: 1.02 (ref 1.010–1.025)
Urobilinogen, UA: 0.2 E.U./dL
pH, UA: 6 (ref 5.0–8.0)

## 2022-07-16 NOTE — Progress Notes (Signed)
Established Patient Office Visit  Subjective   Patient ID: Catherine [name], female    DOB: 08-28-1935  Age: 87 y.o. MRN: [id number]  Chief Complaint  Patient presents with   Abdominal Pain    Lower-abdomen pain   Urinary Frequency    With urgency. Sx 3 days ago. Took AZO 2 days ago. Relief sx    HPI   Catherine [name] is here with onset couple days ago of some burning at the end of her urination in the setting of chronic urgency.  She took some Azo and this morning had no symptoms.  No recent fevers or chills.  No flank pain.  She has chronic low back pain secondary to lumbar stenosis.  She did have confirmed UTI back in September with E. coli resistant to multiple organisms.  No recent gross hematuria.  No nausea or vomiting.  Past Medical History:  Diagnosis Date   Acute on chronic diastolic heart failure ([place])    AKI (acute kidney injury) ([place]) 06/22/2022   Aortic stenosis, mild 07/26/2017   Arthritis    Asthma    last attack 02/2015   Atrial fibrillation, chronic (HCC)    Cellulitis of left lower extremity 08/17/2016   Ulcer associated with severe venous insufficiency   Chronic anticoagulation    Chronic diastolic CHF (congestive heart failure) ([place])    Chronic kidney disease    "RIGHT MANY KIDNEY INFECTIONS AND STONES"   COPD (chronic obstructive pulmonary disease) (HCC)    Coronary artery disease    Dizziness    H/O: rheumatic fever    Heart murmur    Hypertension    PONV (postoperative nausea and vomiting)    ' SOMETIMES', BUT NOT ALWAYS"   S/P Maze operation for atrial fibrillation 10/14/2016   Complete bilateral atrial lesion set using cryothermy and bipolar radiofrequency ablation - atrial appendage was not treated due to previous surgical procedure (open mitral commissurotomy)   S/P mitral valve replacement with bioprosthetic valve 10/14/2016   29 mm Medtronic Mosaic porcine bioprosthetic tissue valve   Tricuspid regurgitation 07/14/2021   UTI (urinary tract infection)     Valvular heart disease    Has mitral stenosis with prior mitral commissurotomy in 1970   Past Surgical History:  Procedure Laterality Date   ABDOMINAL HYSTERECTOMY  1983   endometriosis   APPENDECTOMY     BACK SURGERY     neurosurgery x2   CARDIAC CATHETERIZATION     CARDIAC CATHETERIZATION N/A 08/03/2016   Procedure: Right/Left Heart Cath and Coronary Angiography;  Surgeon: Peter M Martinique, MD;  Location: [place] [facility];  Service: Cardiovascular;  Laterality: N/A;   cataract surg     CHOLECYSTECTOMY N/A 05/30/2015   Procedure: LAPAROSCOPIC CHOLECYSTECTOMY WITH INTRAOPERATIVE CHOLANGIOGRAM;  Surgeon: Excell Seltzer, MD;  Location: [place];  Service: General;  Laterality: N/A;   COLONOSCOPY     CORONARY ARTERY BYPASS GRAFT N/A 10/14/2016   Procedure: CORONARY ARTERY BYPASS GRAFTING (CABG);  Surgeon: Rexene Alberts, MD;  Location: [place];  Service: Open Heart Surgery;  Laterality: N/A;   EYE SURGERY     MAZE N/A 10/14/2016   Procedure: MAZE;  Surgeon: Rexene Alberts, MD;  Location: [place];  Service: Open Heart Surgery;  Laterality: N/A;   MITRAL VALVE REPLACEMENT N/A 10/14/2016   Procedure: REDO MITRAL VALVE REPLACEMENT (MVR);  Surgeon: Rexene Alberts, MD;  Location: [place];  Service: Open Heart Surgery;  Laterality: N/A;   MITRAL VALVE SURGERY Left 1970  Open mitral commissurotomy via left thoracotomy approach   TEE WITHOUT CARDIOVERSION N/A 07/05/2016   Procedure: TRANSESOPHAGEAL ECHOCARDIOGRAM (TEE);  Surgeon: [name], MD;  Location: [place];  Service: Cardiovascular;  Laterality: N/A;   TEE WITHOUT CARDIOVERSION N/A 10/14/2016   Procedure: TRANSESOPHAGEAL ECHOCARDIOGRAM (TEE);  Surgeon: Rexene Alberts, MD;  Location: [place];  Service: Open Heart Surgery;  Laterality: N/A;    reports that she quit smoking about 46 years ago. Her smoking use included cigarettes. She has a 15.00 pack-year smoking history. She has never used smokeless tobacco. She reports that she does not  drink alcohol and does not use drugs. family history includes Leukemia in her father. Allergies  Allergen Reactions   Aldactone [Spironolactone] Other (See Comments)    dyspnea   Amoxicillin Palpitations    Tachycardia Has patient had a PCN reaction causing immediate rash, facial/tongue/throat swelling, SOB or lightheadedness with hypotension: no Has patient had a PCN reaction causing severe rash involving mucus membranes or skin necrosis: {no Has patient had a PCN reaction that required hospitalization {no Has patient had a PCN reaction occurring within the last 10 years: {yes If all of the above answers are "NO", then may proceed with Cephalosporin use.   Diltiazem Other (See Comments)    Causing headaches    Flagyl [Metronidazole Hcl] Other (See Comments)    Causing headaches     Flovent [Fluticasone Propionate] Other (See Comments)    Leg cramps   Gabapentin Swelling   Lyrica [Pregabalin] Swelling   Quinidine Diarrhea and Other (See Comments)    Fever diarrhea   Simvastatin Other (See Comments)    Leg pain, myalgia   Tramadol Nausea Only   Verapamil Other (See Comments)    myalgias   Amlodipine     Low extremity edema   Crestor [Rosuvastatin]     myalgia   Livalo [Pitavastatin]     myalgias   Zetia [Ezetimibe]     LEG CRAMPS    Benazepril Hcl Cough   Ciprofloxacin Diarrhea   Codeine Nausea Only   Nitrofurantoin Monohyd Macro Nausea Only    Review of Systems  Constitutional:  Negative for chills and fever.  Cardiovascular:  Negative for chest pain.  Gastrointestinal:  Negative for abdominal pain.  Genitourinary:  Positive for dysuria and urgency. Negative for flank pain and hematuria.  Musculoskeletal:  Positive for back pain.      Objective:     BP 120/60 (BP Location: Left Arm, Patient Position: Sitting, Cuff Size: Normal)   Pulse 63   Temp 97.9 F (36.6 C) (Oral)   Wt 139 lb 12.8 oz (63.4 kg)   SpO2 96%   BMI 24.76 kg/m  BP Readings from Last 3  Encounters:  07/16/22 120/60  06/22/22 120/68  06/01/22 98/62   Wt Readings from Last 3 Encounters:  07/16/22 139 lb 12.8 oz (63.4 kg)  06/22/22 133 lb 12.8 oz (60.7 kg)  06/01/22 136 lb 12.8 oz (62.1 kg)      Physical Exam Vitals reviewed.  Cardiovascular:     Rate and Rhythm: Normal rate and regular rhythm.  Pulmonary:     Effort: Pulmonary effort is normal.     Breath sounds: Normal breath sounds.  Neurological:     Mental Status: She is alert.      Results for orders placed or performed in visit on 07/16/22  POCT Urinalysis Dipstick (Automated)  Result Value Ref Range   Color, UA Clear    Clarity, UA clear  Glucose, UA Negative Negative   Bilirubin, UA Negative    Ketones, UA Negative    Spec Grav, UA 1.020 1.010 - 1.025   Blood, UA Negative    pH, UA 6.0 5.0 - 8.0   Protein, UA Negative Negative   Urobilinogen, UA 0.2 0.2 or 1.0 E.U./dL   Nitrite, UA Negative    Leukocytes, UA Negative Negative      The ASCVD Risk score (Arnett DK, et al., 2019) failed to calculate for the following reasons:   The 2019 ASCVD risk score is only valid for ages 42 to 74    Assessment & Plan:   Problem List Items Addressed This Visit   None Visit Diagnoses     Urinary frequency    -  Primary   Relevant Orders   POCT Urinalysis Dipstick (Automated) (Completed)     Patient has history of some dysuria few days ago but none today.  Urine dipstick is completely normal.  No evidence for infection.  Reassurance given.  Stressed importance of good hydration and follow-up for any recurrent symptoms.  No indication for antibiotics at this time.  -Flu vaccine given  No follow-ups on file.    [name], MD

## 2022-07-19 ENCOUNTER — Telehealth: Payer: Self-pay | Admitting: Internal Medicine

## 2022-07-19 ENCOUNTER — Encounter: Payer: Self-pay | Admitting: Pulmonary Disease

## 2022-07-19 ENCOUNTER — Other Ambulatory Visit (INDEPENDENT_AMBULATORY_CARE_PROVIDER_SITE_OTHER): Payer: Medicare Other

## 2022-07-19 ENCOUNTER — Ambulatory Visit: Payer: Medicare Other | Admitting: Pulmonary Disease

## 2022-07-19 VITALS — BP 124/64 | HR 65 | Temp 98.3°F | Ht 63.0 in | Wt 138.0 lb

## 2022-07-19 DIAGNOSIS — J4489 Other specified chronic obstructive pulmonary disease: Secondary | ICD-10-CM | POA: Diagnosis not present

## 2022-07-19 DIAGNOSIS — Z23 Encounter for immunization: Secondary | ICD-10-CM

## 2022-07-19 NOTE — Progress Notes (Signed)
[facility]  Chief Complaint  Patient presents with   Follow-up    DOE    Past Surgical History:  She  has a past surgical history that includes Cardiac catheterization; cataract surg; Abdominal hysterectomy (1983); Eye surgery; Cholecystectomy (N/A, 05/30/2015); TEE without cardioversion (N/A, 07/05/2016); Cardiac catheterization (N/A, 08/03/2016); Mitral valve surgery (Left, 1970); Back surgery; Appendectomy; Colonoscopy; Mitral valve replacement (N/A, 10/14/2016); Coronary artery bypass graft (N/A, 10/14/2016); MAZE (N/A, 10/14/2016); and TEE without cardioversion (N/A, 10/14/2016).  Past Medical History:  Diastolic CHF, Rheumatic fever with valvular heart disease s/p MVR, Atrial fibrillation s/p MAZE, CKD, Nephrolithiasis, CAD, HTN  Constitutional:  BP 124/64 (BP Location: Left Arm, Patient Position: Sitting, Cuff Size: Normal)   Pulse 65   Temp 98.3 F (36.8 C) (Oral)   Ht '5\' 3"'$  (1.6 m)   Wt 138 lb (62.6 kg)   SpO2 90%   BMI 24.45 kg/m   Brief Summary:  Catherine [name] is a 87 y.o. female former smoker with COPD and asthma.      Subjective:   She is here with her husband.  She has been followed by Dr. [name].  She was diagnosed with asthma years ago.  She was using symbicort and this helped some, but was expensive.  She was switched to bevespi.  She started getting leg cramps.  She was also having trouble with urination and pelvic area pain.  She was treated for an E coli UTI in September.  She read the side effect profile for bevespi, and thinks this caused much of her symptoms.  She stopped bevespi two days ago.  Her breathing has been okay.  She is not having cough, wheeze, or sputum.  Sleeping okay.  She was sent home with oxygen when she had pneumonia.  She doesn't use the oxygen set up anymore, but has to still pay for this.  Physical Exam:   Appearance - well kempt   ENMT - no sinus tenderness, no oral exudate, no LAN,  Mallampati 2 airway, no stridor  Respiratory - equal breath sounds bilaterally, no wheezing or rales  CV - s1s2 regular rate and rhythm, no murmurs  Ext - no clubbing, no edema  Skin - no rashes  Psych - normal mood and affect   Pulmonary testing:  PFT 10/12/16 >> FEV1 1.34 (71%), FEV1% 70, TLC 5.00 (101%), DLCO 54%  Chest Imaging:  CT angio chest 10/29/21 >> ATX RML, small effusions  Cardiac Tests:  Echo 10/30/21 >> EF 60 to 65%, mod elevation in PASP, mod LA/RA dilation, bioprosthetic MV, mod TR, mild AS  Social History:  She  reports that she quit smoking about 46 years ago. Her smoking use included cigarettes. She has a 15.00 pack-year smoking history. She has never used smokeless tobacco. She reports that she does not drink alcohol and does not use drugs.  Family History:  Her family history includes Leukemia in her father.     Assessment/Plan:   COPD with asthma. - she has minimal symptoms at present - she is concerned that much of her recent symptoms are related to bevespi; this has been discontinued - she can use albuterol prn for now; she has a nebulizer - if she develops progressive symptoms, then will need to consider restarting a LABA/ICS inhaler   Chronic respiratory failure with hypoxia. - this occurred when she had pneumonia - she is no longer using supplemental oxygen - will send order to [employer] to have her home  oxygen set up discontinued  Chronic atrial fibrillation, Rheumatic valvular heart disease s/p MVR, CAD s/p CABG. - followed by Dr. [name] with cardiology  Time Spent Involved in Patient Care on Day of Examination:  35 minutes  Follow up:   Patient Instructions  Stop bevespi.  You can use albuterol every 6 hours as needed for cough, wheeze, chest congestion, or shortness of breath.  Will arrange for follow up with Dr. Halford Chessman at the [facility] office in 2 to 3 months.  Medication List:   Allergies as of 07/19/2022       Reactions    Aldactone [spironolactone] Other (See Comments)   dyspnea   Amoxicillin Palpitations   Tachycardia Has patient had a PCN reaction causing immediate rash, facial/tongue/throat swelling, SOB or lightheadedness with hypotension: no Has patient had a PCN reaction causing severe rash involving mucus membranes or skin necrosis: {no Has patient had a PCN reaction that required hospitalization {no Has patient had a PCN reaction occurring within the last 10 years: {yes If all of the above answers are "NO", then may proceed with Cephalosporin use.   Diltiazem Other (See Comments)   Causing headaches   Flagyl [metronidazole Hcl] Other (See Comments)   Causing headaches   Flovent [fluticasone Propionate] Other (See Comments)   Leg cramps   Gabapentin Swelling   Lyrica [pregabalin] Swelling   Quinidine Diarrhea, Other (See Comments)   Fever diarrhea   Simvastatin Other (See Comments)   Leg pain, myalgia   Tramadol Nausea Only   Verapamil Other (See Comments)   myalgias   Amlodipine    Low extremity edema   Crestor [rosuvastatin]    myalgia   Livalo [pitavastatin]    myalgias   Zetia [ezetimibe]    LEG CRAMPS   Benazepril Hcl Cough   Ciprofloxacin Diarrhea   Codeine Nausea Only   Nitrofurantoin Monohyd Macro Nausea Only        Medication List        Accurate as of July 19, 2022  1:16 PM. If you have any questions, ask your nurse or doctor.          STOP taking these medications    Bevespi Aerosphere 9-4.8 MCG/ACT Aero Generic drug: Glycopyrrolate-Formoterol Stopped by: Chesley Mires, MD       TAKE these medications    acetaminophen 500 MG tablet Commonly known as: TYLENOL Take 1,000 mg by mouth every 6 (six) hours as needed for mild pain.   albuterol 108 (90 Base) MCG/ACT inhaler Commonly known as: VENTOLIN HFA INHALE 2 PUFFS INTO THE LUNGS EVERY 4 HOURS AS NEEDED FOR AHEEZING OR SHORTNESS OF BREATH   albuterol (2.5 MG/3ML) 0.083% nebulizer  solution Commonly known as: PROVENTIL Take 3 mLs (2.5 mg total) by nebulization every 4 (four) hours as needed for wheezing or shortness of breath.   bumetanide 2 MG tablet Commonly known as: Bumex Take 1 tablet (2 mg total) by mouth daily.   cholecalciferol 25 MCG (1000 UNIT) tablet Commonly known as: VITAMIN D3 Take 1,000 Units by mouth daily.   diazepam 5 MG tablet Commonly known as: VALIUM TAKE ONE TABLET AT BEDTIME   Entresto 24-26 MG Generic drug: sacubitril-valsartan Take 1 tablet by mouth 2 (two) times daily.   HAIR/SKIN/NAILS/BIOTIN PO Take 1 tablet by mouth daily.   Multivitamin Adult Extra C Chew Chew 2 tablets by mouth daily.   metolazone 5 MG tablet Commonly known as: ZAROXOLYN TAKE 1 TABLET 30 MINUTES PRIOR TO BUMEX DOSE AS NEEDED FOR  SWELLING   potassium chloride 10 MEQ tablet Commonly known as: KLOR-CON Take 40 mEq by mouth 2 (two) times daily.   warfarin 2 MG tablet Commonly known as: COUMADIN Take as directed by the anticoagulation clinic. If you are unsure how to take this medication, talk to your nurse or doctor. Original instructions: Take 1 tablet daily or take as directed by anticoagulation clinic        Signature:  Chesley Mires, MD [place] Pager - [phone] 07/19/2022, 1:16 PM

## 2022-07-19 NOTE — Telephone Encounter (Signed)
Pt seen in clinic today by Dr Halford Chessman for acute visit  She is asking to change providers to see Dr Halford Chessman at [facility]  Please advise if this is okay, thanks!

## 2022-07-19 NOTE — Telephone Encounter (Signed)
Fine with me

## 2022-07-19 NOTE — Patient Instructions (Signed)
Stop bevespi.  You can use albuterol every 6 hours as needed for cough, wheeze, chest congestion, or shortness of breath.  Will arrange for follow up with Dr. Halford Chessman at the [facility] office in 2 to 3 months.

## 2022-07-20 ENCOUNTER — Other Ambulatory Visit (HOSPITAL_BASED_OUTPATIENT_CLINIC_OR_DEPARTMENT_OTHER): Payer: Self-pay | Admitting: Cardiovascular Disease

## 2022-07-20 NOTE — Telephone Encounter (Signed)
Dr Halford Chessman, are you okay with pt switching to you?

## 2022-07-20 NOTE — Telephone Encounter (Signed)
Yes

## 2022-07-20 NOTE — Telephone Encounter (Signed)
Spoke with the pt  She is aware recall in place for VS at [facility] the appt with Dr [name] for Dec 2023  Nothing further needed

## 2022-07-20 NOTE — Telephone Encounter (Signed)
Rx request sent to pharmacy.  

## 2022-07-21 ENCOUNTER — Ambulatory Visit (HOSPITAL_BASED_OUTPATIENT_CLINIC_OR_DEPARTMENT_OTHER): Payer: Medicare Other | Admitting: Cardiovascular Disease

## 2022-07-21 ENCOUNTER — Encounter (HOSPITAL_BASED_OUTPATIENT_CLINIC_OR_DEPARTMENT_OTHER): Payer: Self-pay | Admitting: Cardiovascular Disease

## 2022-07-21 DIAGNOSIS — I10 Essential (primary) hypertension: Secondary | ICD-10-CM | POA: Diagnosis not present

## 2022-07-21 DIAGNOSIS — I482 Chronic atrial fibrillation, unspecified: Secondary | ICD-10-CM | POA: Diagnosis not present

## 2022-07-21 DIAGNOSIS — I5043 Acute on chronic combined systolic (congestive) and diastolic (congestive) heart failure: Secondary | ICD-10-CM | POA: Diagnosis not present

## 2022-07-21 DIAGNOSIS — R300 Dysuria: Secondary | ICD-10-CM

## 2022-07-21 MED ORDER — BUMETANIDE 2 MG PO TABS: ORAL_TABLET | ORAL | 3 refills | Status: DC

## 2022-07-21 NOTE — Assessment & Plan Note (Signed)
Currently in sinus rhythm.  Continue warfarin.

## 2022-07-21 NOTE — Progress Notes (Signed)
Cardiology Office Note  Date:  07/21/2022   ID:  Catherine [name], DOB 29-Jun-1935, MRN [id number]  PCP:  [name], MD  Cardiologist:   [name], MD  Cardiothoracic surgeon: Dr. Roxy Manns  No chief complaint on file.  History of Present Illness: Catherine [name] is a 87 y.o. 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 Catherine [name] was previously a patient of [name]. She underwent mitral commissurotomy in the 1970s. On her echocardiogram 02/2014 she had an ejection fraction of 60-65% with moderate to severe mitral regurgitation. There was also mild aortic regurgitation. Her follow-up echo 02/2015 showed an EF of 60-65% with moderate to severe mitral regurgitation, moderate tricuspid regurgitation and elevated pulmonary artery pressures.  Repeat echo 9/11/7 revealed moderate pulmonary stenosis with moderate to severe mitral regurgitation.  She did not have any pulmonary hypertension and her LV was not dilated.  She was referred for left and right heart catheterization. She was found to have severe mitral stenosis with a 13 mmHg mean gradient, MVA 0.99 cm^2. She also had 50% LCx and 80% RCA lesions.  She had a 29 mm bioprosthetic mitral valve, single vessel CABG and MAZE with Dr. Roxy Manns on 10/14/16.  She was referred for a baseline echo 02/15/17 that revealed LVEF 60-65% with mild aortic stenosis (mean gradient 12 mmHg).  Her bioprosthetic mitral valve was functioning well.    After her surgery Catherine [name] continued to report fatigue.  It was unclear whether she was in atrial fibrillation or sinus rhythm with a long PR interval.  She was referred to the atrial fibrillation clinic where this remained unclear.  However, it was felt that if she were in atrial fibrillation it would not be possible to maintain sinus rhythm.  She  developed lower extremity edema. She stopped amlodipine and this improved. This was switched to irbesartan. She then developed diaphoresis and insomnia that she attributed to this medication. She was switched from lasix to torsemide which helped her abdominal bloating but she still had LE edema. She has followed up with [name] NP and noted increased LE edema and hip pain. She also reported dizziness and had a repeat echo 06/2021 with LV 60-65%. Her bioprosthetic mitral valve was functioning properly. She had moderate to severe tricuspid regurgitation and PASP was 105mHg.   She reported some shortness of breath that improved with torsemide. Her at home blood pressure was ranging 110s-150s/50s-60s, averaging in the 120s-130s. On 10/25/2021 she presented to the ED with complaints of fatigue, cough, nausea, fever, congestion, and headache. Her symptoms were thought to have a viral etiology. She was later admitted to the hospital 10/29/2021 and was found to be hypoxic to 84% with activity, BNP 234, and a chest CT revealed right middle lobe atelectasis and small pleural effusions. 2D echo noted preserved EF. She had moderate TR and her bioprosthetic mitral valve was stable. She also reported right lateral chest wall pain with upper back pain. It was suspected she had a COPD exacerbation with mild component of diastolic CHF.   She was hospitalized 12/2020 with acute on chronic diastolic heart failure and COPD.  She was diuresed with IV Lasix and started on low-dose losartan.  In the hospital she was noted to have episodes of A-fib with bradycardia.  Her heart rates dropped into the 40s and she had up to 2.4-second pauses  on metoprolol.  Her metoprolol dose was reduced.  She followed up in advanced heart failure clinic 12/2021 and was feeling much better.  She saw [name], NP 01/2022 and her dyspnea was improving.  She was started on amiodarone. It made her feel poorly, she said her hair was falling out, and it  affected her coumadin levels. Amiodarone was discontinued. Torsemide was switched to Bumex. :Losartan was switched to [employer]. She followed up with [name], NP and it was noted that instead of Bumex the pharmacist gave her budesonide. She was referred to the PREP program, and followed up with [facility] the following month. Her weight was down 8 lbs. She had a good response to metolazone, so it was added once per week.   At her last visit she was struggling with persistent nausea. Her at home blood pressures were ranging in the 100s-110s more frequently. She also appeared somewhat dry on exam. Bumex was reduced to once daily. Metolazone was changed to prn for edema, shortness of breath, or other heart failure symptoms.   Today, she is accompanied by a family member. She is not feeling well. One of her main concerns today is management of her fluid retention. Usually in the middle of the week she will again notice the start of gradual weight gain of 1-2 lbs. Today she presents a log of weights and blood pressures, personally reviewed. She notes on the day that her weight was 135 lbs, she had taken metolazone due to fluid. She noticed a weight loss of 4 lbs that night. This past year she has had multiple UTI infections, associated with sharp pain after urination. Last Wednesday and Thursday, this symptom recurred. She saw her PCP Friday and was not found to have an infection. However, she continued to notice the urinary pain. Additionally, she has been concerned about her inhalers causing chronic side effects such as her frequent UTIs, LE muscle cramps, and polydipsia. She saw her pulmonologist 06/2022 and her Charolotte Eke was discontinued. She was instructed to use albuterol as needed. This morning was the first time she needed to use the albuterol. Also, she spontaneously developed persistent back pain that has prevented her from formally exercising. She denies any palpitations, chest pain, lightheadedness,  headaches, syncope, orthopnea, or PND.   Past Medical History:  Diagnosis Date   Acute on chronic diastolic heart failure (HCC)    AKI (acute kidney injury) ([place]) 06/22/2022   Aortic stenosis, mild 07/26/2017   Arthritis    Asthma    last attack 02/2015   Atrial fibrillation, chronic (HCC)    Cellulitis of left lower extremity 08/17/2016   Ulcer associated with severe venous insufficiency   Chronic anticoagulation    Chronic diastolic CHF (congestive heart failure) ([place])    Chronic kidney disease    "RIGHT MANY KIDNEY INFECTIONS AND STONES"   COPD (chronic obstructive pulmonary disease) (HCC)    Coronary artery disease    Dizziness    H/O: rheumatic fever    Heart murmur    Hypertension    PONV (postoperative nausea and vomiting)    ' SOMETIMES', BUT NOT ALWAYS"   S/P Maze operation for atrial fibrillation 10/14/2016   Complete bilateral atrial lesion set using cryothermy and bipolar radiofrequency ablation - atrial appendage was not treated due to previous surgical procedure (open mitral commissurotomy)   S/P mitral valve replacement with bioprosthetic valve 10/14/2016   29 mm Medtronic Mosaic porcine bioprosthetic tissue valve   Tricuspid regurgitation 07/14/2021   UTI (urinary  tract infection)    Valvular heart disease    Has mitral stenosis with prior mitral commissurotomy in 1970    Past Surgical History:  Procedure Laterality Date   ABDOMINAL HYSTERECTOMY  1983   endometriosis   APPENDECTOMY     BACK SURGERY     neurosurgery x2   CARDIAC CATHETERIZATION     CARDIAC CATHETERIZATION N/A 08/03/2016   Procedure: Right/Left Heart Cath and Coronary Angiography;  Surgeon: Peter M Martinique, MD;  Location: [place] [facility];  Service: Cardiovascular;  Laterality: N/A;   cataract surg     CHOLECYSTECTOMY N/A 05/30/2015   Procedure: LAPAROSCOPIC CHOLECYSTECTOMY WITH INTRAOPERATIVE CHOLANGIOGRAM;  Surgeon: Excell Seltzer, MD;  Location: [place];  Service: General;  Laterality: N/A;    COLONOSCOPY     CORONARY ARTERY BYPASS GRAFT N/A 10/14/2016   Procedure: CORONARY ARTERY BYPASS GRAFTING (CABG);  Surgeon: Rexene Alberts, MD;  Location: [place];  Service: Open Heart Surgery;  Laterality: N/A;   EYE SURGERY     MAZE N/A 10/14/2016   Procedure: MAZE;  Surgeon: Rexene Alberts, MD;  Location: [place];  Service: Open Heart Surgery;  Laterality: N/A;   MITRAL VALVE REPLACEMENT N/A 10/14/2016   Procedure: REDO MITRAL VALVE REPLACEMENT (MVR);  Surgeon: Rexene Alberts, MD;  Location: [place];  Service: Open Heart Surgery;  Laterality: N/A;   MITRAL VALVE SURGERY Left 1970   Open mitral commissurotomy via left thoracotomy approach   TEE WITHOUT CARDIOVERSION N/A 07/05/2016   Procedure: TRANSESOPHAGEAL ECHOCARDIOGRAM (TEE);  Surgeon: [name], MD;  Location: [place];  Service: Cardiovascular;  Laterality: N/A;   TEE WITHOUT CARDIOVERSION N/A 10/14/2016   Procedure: TRANSESOPHAGEAL ECHOCARDIOGRAM (TEE);  Surgeon: Rexene Alberts, MD;  Location: [place];  Service: Open Heart Surgery;  Laterality: N/A;    Current Outpatient Medications  Medication Sig Dispense Refill   acetaminophen (TYLENOL) 500 MG tablet Take 1,000 mg by mouth every 6 (six) hours as needed for mild pain.     albuterol (VENTOLIN HFA) 108 (90 Base) MCG/ACT inhaler INHALE 2 PUFFS INTO THE LUNGS EVERY 4 HOURS AS NEEDED FOR AHEEZING OR SHORTNESS OF BREATH 8.5 g 2   cholecalciferol (VITAMIN D3) 25 MCG (1000 UNIT) tablet Take 1,000 Units by mouth daily.     diazepam (VALIUM) 5 MG tablet TAKE ONE TABLET AT BEDTIME 30 tablet 5   metolazone (ZAROXOLYN) 5 MG tablet TAKE 1 TABLET 30 MINUTES PRIOR TO BUMEX DOSE AS NEEDED FOR SWELLING 5 tablet 0   Multiple Vitamins-Minerals (HAIR/SKIN/NAILS/BIOTIN PO) Take 1 tablet by mouth daily.     Multiple Vitamins-Minerals (MULTIVITAMIN ADULT EXTRA C) CHEW Chew 2 tablets by mouth daily.     potassium chloride (KLOR-CON) 10 MEQ tablet TAKE UP TO 8 TABLETS DAILY AS DIRECTED 720 tablet 3    sacubitril-valsartan (ENTRESTO) 24-26 MG Take 1 tablet by mouth 2 (two) times daily. 180 tablet 3   warfarin (COUMADIN) 2 MG tablet Take 1 tablet daily or take as directed by anticoagulation clinic 100 tablet 1   albuterol (PROVENTIL) (2.5 MG/3ML) 0.083% nebulizer solution Take 3 mLs (2.5 mg total) by nebulization every 4 (four) hours as needed for wheezing or shortness of breath. (Patient not taking: Reported on 07/21/2022) 75 mL 2   bumetanide (BUMEX) 2 MG tablet Please take '2mg'$  daily in the morning and then '2mg'$  in the afternoon on MONDAY, [facility] AND FRIDAY 120 tablet 3   No current facility-administered medications for this visit.    Allergies:   Aldactone [  spironolactone], Amoxicillin, Diltiazem, Flagyl [metronidazole hcl], Flovent [fluticasone propionate], Gabapentin, Lyrica [pregabalin], Quinidine, Simvastatin, Tramadol, Verapamil, Amlodipine, Crestor [rosuvastatin], Livalo [pitavastatin], Zetia [ezetimibe], Benazepril hcl, Ciprofloxacin, Codeine, and Nitrofurantoin monohyd macro    Social History:  The patient  reports that she quit smoking about 46 years ago. Her smoking use included cigarettes. She has a 15.00 pack-year smoking history. She has never used smokeless tobacco. She reports that she does not drink alcohol and does not use drugs.   Family History:  The patient's family history includes Leukemia in her father.   ROS:   Please see the history of present illness.   (+) Fluid retention (+) Dysuria (+) Bilateral LE muscle cramps (+) Back pain (+) Polydipsia All other systems are reviewed and negative.   PHYSICAL EXAM:  VS:  BP 108/68   Pulse (!) 57   Ht '5\' 3"'$  (1.6 m)   Wt 140 lb (63.5 kg)   SpO2 98%   BMI 24.80 kg/m  , BMI Body mass index is 24.8 kg/m. GENERAL:  Well appearing HEENT: Pupils equal round and reactive, fundi not visualized, oral mucosa unremarkable NECK:  No jugular venous distention, waveform within normal limits, carotid upstroke brisk and  symmetric, no bruits LUNGS:  Clear to auscultation bilaterally HEART:  RRR.  PMI not displaced or sustained,S1 and S2 within normal limits, no S3, no S4, no clicks, no rubs,  III/VI systolic murmur at the LUSB ABD:   Abdomen flat. Positive bowel sounds normal in frequency in pitch, no bruits, no rebound, no guarding, no midline pulsatile mass, no hepatomegaly, no splenomegaly EXT:  2 plus pulses throughout,  no edema, no cyanosis no clubbing SKIN: No bruises or ecchymosis NEURO:  Cranial nerves II through XII grossly intact, motor grossly intact throughout PSYCH:  Cognitively intact, oriented to person place and time  EKG:  EKG is personally reviewed. 07/21/2022:  EKG was not ordered. 06/22/2022:  EKG was not ordered. 11/19/2021: EKG was not ordered. 07/14/2021:  No EKG was ordered. 07/21/20: Atrial fibrillation.  Rate 70 bpm.  Prior septal infarct. 01/10/20: Atrial fibrillation. Rate 56 bpm.  Prior septal infarct. 06/06/19: Atrial fibrillation.  Rate 61 bpm.  Prior septal infarct. 10/10/18: Sinus rhythm.  Rate 89 bpm.  Nonspecific ST changes.  01/05/18: Accelerated junctional rhythm.  Rate 68 bpm.  Incomplete RBBB.  04/26/17: Atrial fibrillation.  Rate 69 bpm. 03/08/17: Sinus bradycardia with sinus arrhythmia.   First degree heart block.  Junctional escape.  QTc 497 ms.  02/01/17: Sinus bradycardia with sinus arrhythmia and first degree block.  10/27/16: Sinus rhythm.  Rate 60 bpm.  First degree heart block. 05/21/16: atrial fibrillateion.  Prior septal infarct.  Rate 60 bpm.  Unchanged from 10/22/15.   Echo 10/30/2021:  1. Left ventricular ejection fraction, by estimation, is 60 to 65%. The  left ventricle has normal function. The left ventricle has no regional  wall motion abnormalities. Left ventricular diastolic parameters are  indeterminate.   2. Right ventricular systolic function is mildly reduced. The right  ventricular size is mildly enlarged. There is moderately elevated  pulmonary artery  systolic pressure.   3. Left atrial size was moderately dilated.   4. Right atrial size was moderately dilated.   5. Normal appearing bioprosthetic MVR no PVL low mean gradient at HR 58  bpm Stent struts protrude into LVOT but no obvious sub valvular gradients  . The mitral valve has been repaired/replaced. No evidence of mitral valve  regurgitation. No evidence of mitral stenosis.  6. Tricuspid valve regurgitation is moderate.   7. The aortic valve is tricuspid. There is moderate calcification of the  aortic valve. There is moderate thickening of the aortic valve. Aortic  valve regurgitation is not visualized. Mild aortic valve stenosis.   8. The inferior vena cava is normal in size with greater than 50%  respiratory variability, suggesting right atrial pressure of 3 mmHg.   CTA Chest 10/29/2021: COMPARISON:  Chest x-ray from earlier in the same day.   FINDINGS: Cardiovascular: Atherosclerotic calcifications of the thoracic aorta are noted. Changes consistent with prior coronary bypass grafting are noted. No aneurysmal dilatation is seen. Heart is at the upper limits of normal in size. The pulmonary artery shows a normal branching pattern. No intraluminal filling defect to suggest pulmonary embolism is seen. Mild coronary calcifications are noted.   Mediastinum/Nodes: Thoracic inlet is within normal limits. No sizable hilar or mediastinal adenopathy is noted. The esophagus as visualized is within normal limits.   Lungs/Pleura: Mild right middle lobe atelectatic changes are noted. Small pleural effusions are noted bilaterally. No sizable parenchymal nodules are seen. No pneumothorax is noted.   Upper Abdomen: Visualized upper abdomen is within normal limits.   Musculoskeletal: Postsurgical changes in the cervical spine are seen. No acute bony abnormality is noted.   Review of the MIP images confirms the above findings.   IMPRESSION: No evidence of pulmonary emboli.   Right  middle lobe atelectatic changes.   Small pleural effusions are seen.   Aortic Atherosclerosis (ICD10-I70.0).  Echo 07/07/2021 1. Left ventricular ejection fraction, by estimation, is 60 to 65%. The  left ventricle has normal function. The left ventricle has no regional  wall motion abnormalities. Left ventricular diastolic function could not  be evaluated.   2. Right ventricular systolic function is mildly reduced. The right  ventricular size is mildly enlarged. There is mildly elevated pulmonary  artery systolic pressure. The estimated right ventricular systolic  pressure is 28.3 mmHg.   3. Left atrial size was severely dilated.   4. Right atrial size was severely dilated.   5. 29 mm Medtronic Mosaic Bioprosthesis in mitral position (10/14/2016).  MG 5.5 @ 55 bpm. E max 2.1 m/s, PHT ~120 msec, EOA 2.1 cm2, DI 1.36. All  parameters are within normal limits for this prosthetic valve. The mitral  valve has been repaired/replaced.  No evidence of mitral valve regurgitation. Procedure Date: 10/14/2016.   6. There is at least moderate TR. there is systolic flow reversal in the  hepatic veins suggestive of significant TR. Interrogation poor on this  study. Tricuspid valve regurgitation is moderate to severe.   7. The aortic valve is tricuspid. There is mild calcification of the  aortic valve. There is mild thickening of the aortic valve. Aortic valve  regurgitation is not visualized. Mild to moderate aortic valve  sclerosis/calcification is present, without any  evidence of aortic stenosis.   8. The inferior vena cava is dilated in size with >50% respiratory  variability, suggesting right atrial pressure of 8 mmHg.   Echo 09/2019: 1. Left ventricular ejection fraction, by visual estimation, is 65 to  70%. The left ventricle has hyperdynamic function. Left ventricular septal  wall thickness was mildly increased. There is mildly increased left  ventricular hypertrophy.   2. The left  ventricle has no regional wall motion abnormalities.   3. Global right ventricle has normal systolic function.The right  ventricular size is normal. No increase in right ventricular wall  thickness.   4.  Left atrial size was moderately dilated.   5. Right atrial size was moderately dilated.   6. The mitral valve has been repaired/replaced. No evidence of mitral  valve regurgitation. No evidence of mitral stenosis.   7. Patient has a 29 mm biprosthetic valve functioning normally with no  PVL The mitral struts protrude into the LVOT quite a bit likely  contributing to some of the gradient across the LVOT/AV. Creates a small  "neointimal LVOT" area.   8. The tricuspid valve is normal in structure.   9. The aortic valve is tricuspid. Aortic valve regurgitation is not  visualized. Mild aortic valve stenosis.  10. Pulmonic regurgitation is mild.  11. The pulmonic valve was grossly normal. Pulmonic valve regurgitation is  mild.  12. Mildly elevated pulmonary artery systolic pressure.   Echo 07/06/18: Study Conclusions   - Left ventricle: The cavity size was normal. Systolic function was   vigorous. The estimated ejection fraction was in the range of 65%   to 70%. Wall motion was normal; there were no regional wall   motion abnormalities. Doppler parameters are consistent with high   ventricular filling pressure. - Aortic valve: Trileaflet; mildly thickened, mildly calcified   leaflets. There was mild stenosis. Peak velocity (S): 266 cm/s.   Mean gradient (S): 16 mm Hg. - Mitral valve: A bioprosthesis was present and functioning   normally. - Left atrium: The atrium was severely dilated. Anterior-posterior   dimension: 60 mm. - Tricuspid valve: There was mild regurgitation.  7 Day Event Monitor 06/28/17:   Quality: Fair.  Baseline artifact. Predominant rhythm: Sinus rhythm with PACs and sinus arrhythmia.     Cannot  rule out episodes of atrial fibrillation.  Carotid Dopplers  10/12/16: 1-39% bilateral ICA stenosis  LHC/RHC 08/03/16: The left ventricular systolic function is normal. LV end diastolic pressure is normal. The left ventricular ejection fraction is 55-65% by visual estimate. There is no aortic valve stenosis. There is severe mitral valve stenosis. Prox Cx to Mid Cx lesion, 50 %stenosed. Mid RCA-2 lesion, 80 %stenosed. Mid RCA-1 lesion, 80 %stenosed. Hemodynamic findings consistent with mild pulmonary hypertension. LV end diastolic pressure is normal.   1. Coronary artery disease   - 50% mid LCx   - 80% sequential lesions in the mid RCA 2. Normal LV function 3. Severe mitral stenosis. MV gradient of 13 mm Hg. MVA 0.99 cm squared with index 0.57. 4. Moderate to severe mitral insufficiency 5. Mild pulmonary HTN. 6. Normal LV EDP 7. No significant AV gradient 8. Occluded right brachial artery.   Recent Labs: 01/05/2022: TSH 1.716 04/19/2022: BNP 133.2; Hemoglobin 12.6; Magnesium 1.7; Platelets 185 06/14/2022: ALT 13 06/29/2022: BUN 21; Creatinine, Ser 1.40; Potassium 3.1; Sodium 143    Lipid Panel    Component Value Date/Time   CHOL 297 (H) 06/14/2022 1032   TRIG 295 (H) 06/14/2022 1032   HDL 44 06/14/2022 1032   CHOLHDL 6.8 (H) 06/14/2022 1032   CHOLHDL 2.8 07/09/2020 1403   VLDL 42.8 (H) 09/24/2019 1054   LDLCALC 195 (H) 06/14/2022 1032   LDLCALC 77 07/09/2020 1403   LDLDIRECT 68.0 09/24/2019 1054      Wt Readings from Last 3 Encounters:  07/21/22 140 lb (63.5 kg)  07/19/22 138 lb (62.6 kg)  07/16/22 139 lb 12.8 oz (63.4 kg)     ASSESSMENT AND PLAN:  Atrial fibrillation, chronic (HCC) Currently in sinus rhythm.  Continue warfarin.  Essential hypertension Blood pressure is well-controlled.  She is tolerating Entresto.  Acute  exacerbation of CHF (congestive heart failure) ([place]) She has struggled with volume overload.  She also has chronic kidney disease.  We will continue Entresto.  Continue bumetanide '2mg'$  daily and '2mg'$  MWF  in the afternoon.  Continue metolazone once per week.  Continue to limit sodium intake.  We discussed adding [place] or [place] but she has frequent UTIs so we will not start it.   Dysuria Frequent UTIs and she reports dysuria.  Check U/A with culture.   Disposition:   FU with Megin [name] C. Oval Linsey, MD, [facility] in 2 months.  Medication Adjustments/Labs and Tests Ordered: Current medicines are reviewed at length with the patient today.  Concerns regarding medicines are outlined above.   Orders Placed This Encounter  Procedures   Urinalysis   Basic metabolic panel   Basic metabolic panel   Meds ordered this encounter  Medications   DISCONTD: bumetanide (BUMEX) 2 MG tablet    Sig: Please take '20mg'$  daily in the morning and then '20mg'$  in the afternoon on MONDAY, [facility] AND FRIDAY    Dispense:  120 tablet    Refill:  3    New dose, d/c previous rx   bumetanide (BUMEX) 2 MG tablet    Sig: Please take '2mg'$  daily in the morning and then '2mg'$  in the afternoon on MONDAY, [facility] AND FRIDAY    Dispense:  120 tablet    Refill:  3    New dose, d/c previous rx   Patient Instructions  Medication Instructions:  Your physician has recommended you make the following change in your medication:   Change: Bumex '2mg'$  daily in the arm, and add a '2mg'$  dose in the afternoon on Monday, Wednesday, Friday   Please call Entresto Patient Assistance to ensure refills are being sent the number is [phone]  *If you need a refill on your cardiac medications before your next appointment, please call your pharmacy*   Lab Work: Your physician recommends that you return for lab work today- Urine Analysis and BMP   Please return for Lab work in 2 weeks for repeat BMP. You may come to the...   [facility] (3rd floor) [address], [place], [place] [address]  Open: 8am-Noon and 1pm-4:30pm  Please ring the doorbell on the small table when you exit the elevator and the Lab Tech will come get  you  [place] [facility] [address] [place], [place], [place] [address] Open: 8am-1pm, then 2pm-4:30pm   [place]- Please see attached locations sheet stapled to your lab work with address and hours.    If you have labs (blood work) drawn today and your tests are completely normal, you will receive your results only by: [place] (if you have MyChart) OR A paper copy in the mail If you have any lab test that is abnormal or we need to change your treatment, we will call you to review the results.   Follow-Up: At [facility], you and your health needs are our priority.  As part of our continuing mission to provide you with exceptional heart care, we have created designated Provider Care Teams.  These Care Teams include your primary Cardiologist (physician) and Advanced Practice Providers (APPs -  Physician Assistants and Nurse Practitioners) who all work together to provide you with the care you need, when you need it.  We recommend signing up for the patient portal called "MyChart".  Sign up information is provided on this After Visit Summary.  MyChart is used  to connect with patients for Virtual Visits (Telemedicine).  Patients are able to view lab/test results, encounter notes, upcoming appointments, etc.  Non-urgent messages can be sent to your provider as well.   To learn more about what you can do with MyChart, go to [email or web address].    Your next appointment:   2 month(s)  The format for your next appointment:   In Person  Provider:   [name], MD or [name], NP      [facility],acting as a scribe for [name], MD.,have documented all relevant documentation on the behalf of [name], MD,as directed by  [name], MD while in the presence of [name], MD.  I, Nichols Oval Linsey, MD have reviewed all documentation for this visit.  The documentation of the exam,  diagnosis, procedures, and orders on 07/21/2022 are all accurate and complete.  Signed, Bianca Vester C. Oval Linsey, MD, [facility]  07/21/2022 11:15 AM    Catherine [name]

## 2022-07-21 NOTE — Assessment & Plan Note (Addendum)
She has struggled with volume overload.  She also has chronic kidney disease.  We will continue Entresto.  Continue bumetanide '2mg'$  daily and '2mg'$  MWF in the afternoon.  Continue metolazone once per week.  Continue to limit sodium intake.  We discussed adding [place] or [place] but she has frequent UTIs so we will not start it.

## 2022-07-21 NOTE — Assessment & Plan Note (Signed)
Blood pressure is well-controlled.  She is tolerating Entresto.

## 2022-07-21 NOTE — Assessment & Plan Note (Signed)
Frequent UTIs and she reports dysuria.  Check U/A with culture.

## 2022-07-21 NOTE — Patient Instructions (Signed)
Medication Instructions:  Your physician has recommended you make the following change in your medication:   Change: Bumex '2mg'$  daily in the arm, and add a '2mg'$  dose in the afternoon on Monday, Wednesday, Friday   Please call Entresto Patient Assistance to ensure refills are being sent the number is [phone]  *If you need a refill on your cardiac medications before your next appointment, please call your pharmacy*   Lab Work: Your physician recommends that you return for lab work today- Urine Analysis and BMP   Please return for Lab work in 2 weeks for repeat BMP. You may come to the...   [facility] (3rd floor) [address] [place], [place] [address]  Open: 8am-Noon and 1pm-4:30pm  Please ring the doorbell on the small table when you exit the elevator and the Lab Tech will come get you  [place] [facility] [address] [place], [place], [place] [address] Open: 8am-1pm, then 2pm-4:30pm   [place]- Please see attached locations sheet stapled to your lab work with address and hours.    If you have labs (blood work) drawn today and your tests are completely normal, you will receive your results only by: [place] (if you have MyChart) OR A paper copy in the mail If you have any lab test that is abnormal or we need to change your treatment, we will call you to review the results.   Follow-Up: At [facility], you and your health needs are our priority.  As part of our continuing mission to provide you with exceptional heart care, we have created designated Provider Care Teams.  These Care Teams include your primary Cardiologist (physician) and Advanced Practice Providers (APPs -  Physician Assistants and Nurse Practitioners) who all work together to provide you with the care you need, when you need it.  We recommend signing up for the patient portal called "MyChart".  Sign up information is provided on this After  Visit Summary.  MyChart is used to connect with patients for Virtual Visits (Telemedicine).  Patients are able to view lab/test results, encounter notes, upcoming appointments, etc.  Non-urgent messages can be sent to your provider as well.   To learn more about what you can do with MyChart, go to [email or web address].    Your next appointment:   2 month(s)  The format for your next appointment:   In Person  Provider:   [name], MD or [name], NP

## 2022-07-22 DIAGNOSIS — I482 Chronic atrial fibrillation, unspecified: Secondary | ICD-10-CM | POA: Diagnosis not present

## 2022-07-22 DIAGNOSIS — I10 Essential (primary) hypertension: Secondary | ICD-10-CM | POA: Diagnosis not present

## 2022-07-22 DIAGNOSIS — I5043 Acute on chronic combined systolic (congestive) and diastolic (congestive) heart failure: Secondary | ICD-10-CM | POA: Diagnosis not present

## 2022-07-23 LAB — BASIC METABOLIC PANEL
BUN/Creatinine Ratio: 15 (ref 12–28)
BUN: 15 mg/dL (ref 8–27)
CO2: 25 mmol/L (ref 20–29)
Calcium: 9.1 mg/dL (ref 8.7–10.3)
Chloride: 100 mmol/L (ref 96–106)
Creatinine, Ser: 0.99 mg/dL (ref 0.57–1.00)
Glucose: 90 mg/dL (ref 70–99)
Potassium: 4.4 mmol/L (ref 3.5–5.2)
Sodium: 138 mmol/L (ref 134–144)
eGFR: 56 mL/min/{1.73_m2} — ABNORMAL LOW (ref >59)

## 2022-07-23 LAB — URINALYSIS
Bilirubin, UA: NEGATIVE
Glucose, UA: NEGATIVE
Ketones, UA: NEGATIVE
Nitrite, UA: NEGATIVE
RBC, UA: NEGATIVE
Specific Gravity, UA: 1.012 (ref 1.005–1.030)
Urobilinogen, Ur: 0.2 mg/dL (ref 0.2–1.0)
pH, UA: 6.5 (ref 5.0–7.5)

## 2022-07-27 ENCOUNTER — Encounter: Payer: Self-pay | Admitting: Family Medicine

## 2022-07-27 ENCOUNTER — Ambulatory Visit (INDEPENDENT_AMBULATORY_CARE_PROVIDER_SITE_OTHER): Payer: Medicare Other | Admitting: Family Medicine

## 2022-07-27 VITALS — BP 110/66 | HR 55 | Temp 97.9°F | Ht 63.0 in | Wt 137.5 lb

## 2022-07-27 DIAGNOSIS — N952 Postmenopausal atrophic vaginitis: Secondary | ICD-10-CM

## 2022-07-27 DIAGNOSIS — R300 Dysuria: Secondary | ICD-10-CM

## 2022-07-27 LAB — POC URINALSYSI DIPSTICK (AUTOMATED)
Bilirubin, UA: NEGATIVE
Blood, UA: NEGATIVE
Glucose, UA: NEGATIVE
Ketones, UA: NEGATIVE
Leukocytes, UA: NEGATIVE
Nitrite, UA: NEGATIVE
Protein, UA: NEGATIVE
Spec Grav, UA: 1.015 (ref 1.010–1.025)
Urobilinogen, UA: 0.2 E.U./dL
pH, UA: 7 (ref 5.0–8.0)

## 2022-07-27 MED ORDER — ESTRADIOL 0.1 MG/GM VA CREA: TOPICAL_CREAM | VAGINAL | 11 refills | Status: DC

## 2022-07-27 NOTE — Progress Notes (Signed)
Established Patient Office Visit  Subjective   Patient ID: Catherine [name], female    DOB: 1935-03-31  Age: 87 y.o. MRN: [id number]  Chief Complaint  Patient presents with   Recurrent UTI    Tried Cephalexin    Back Pain    HPI   Catherine [name] is seen for the following issues  History of recurrent UTIs.  She had E. coli UTI recently which was treated successfully with Keflex.  She started having some burning again vaginal area recently without any discharge.  She was concerned about possible UTI and started herself back on some leftover Keflex.  No fever.  No flank pain.  No gross hematuria.  She has frequent burning symptoms in her vagina region.  She does have occasional stress incontinence and wears pads frequently during the day.  No recent rash.  No vaginal discharge.  She has chronic low back pain with severe arthritis lumbar spine.  She has pending follow-up next week with new neurosurgeon.  She has had multiple injections in the past year without improvement.  Pain is worse with ambulation.  Improved some at rest.  Past Medical History:  Diagnosis Date   Acute on chronic diastolic heart failure (HCC)    AKI (acute kidney injury) ([place]) 06/22/2022   Aortic stenosis, mild 07/26/2017   Arthritis    Asthma    last attack 02/2015   Atrial fibrillation, chronic (HCC)    Cellulitis of left lower extremity 08/17/2016   Ulcer associated with severe venous insufficiency   Chronic anticoagulation    Chronic diastolic CHF (congestive heart failure) ([place])    Chronic kidney disease    "RIGHT MANY KIDNEY INFECTIONS AND STONES"   COPD (chronic obstructive pulmonary disease) (HCC)    Coronary artery disease    Dizziness    H/O: rheumatic fever    Heart murmur    Hypertension    PONV (postoperative nausea and vomiting)    ' SOMETIMES', BUT NOT ALWAYS"   S/P Maze operation for atrial fibrillation 10/14/2016   Complete bilateral atrial lesion set using cryothermy and bipolar radiofrequency  ablation - atrial appendage was not treated due to previous surgical procedure (open mitral commissurotomy)   S/P mitral valve replacement with bioprosthetic valve 10/14/2016   29 mm Medtronic Mosaic porcine bioprosthetic tissue valve   Tricuspid regurgitation 07/14/2021   UTI (urinary tract infection)    Valvular heart disease    Has mitral stenosis with prior mitral commissurotomy in 1970   Past Surgical History:  Procedure Laterality Date   ABDOMINAL HYSTERECTOMY  1983   endometriosis   APPENDECTOMY     BACK SURGERY     neurosurgery x2   CARDIAC CATHETERIZATION     CARDIAC CATHETERIZATION N/A 08/03/2016   Procedure: Right/Left Heart Cath and Coronary Angiography;  Surgeon: Peter M Martinique, MD;  Location: [facility];  Service: Cardiovascular;  Laterality: N/A;   cataract surg     CHOLECYSTECTOMY N/A 05/30/2015   Procedure: LAPAROSCOPIC CHOLECYSTECTOMY WITH INTRAOPERATIVE CHOLANGIOGRAM;  Surgeon: Excell Seltzer, MD;  Location: [facility];  Service: General;  Laterality: N/A;   COLONOSCOPY     CORONARY ARTERY BYPASS GRAFT N/A 10/14/2016   Procedure: CORONARY ARTERY BYPASS GRAFTING (CABG);  Surgeon: Rexene Alberts, MD;  Location: [facility];  Service: Open Heart Surgery;  Laterality: N/A;   EYE SURGERY     MAZE N/A 10/14/2016   Procedure: MAZE;  Surgeon: Rexene Alberts, MD;  Location: [facility];  Service: Open Heart Surgery;  Laterality: N/A;   MITRAL VALVE REPLACEMENT N/A 10/14/2016   Procedure: REDO MITRAL VALVE REPLACEMENT (MVR);  Surgeon: Rexene Alberts, MD;  Location: [facility];  Service: Open Heart Surgery;  Laterality: N/A;   MITRAL VALVE SURGERY Left 1970   Open mitral commissurotomy via left thoracotomy approach   TEE WITHOUT CARDIOVERSION N/A 07/05/2016   Procedure: TRANSESOPHAGEAL ECHOCARDIOGRAM (TEE);  Surgeon: [name], MD;  Location: [facility];  Service: Cardiovascular;  Laterality: N/A;   TEE WITHOUT CARDIOVERSION N/A 10/14/2016   Procedure: TRANSESOPHAGEAL ECHOCARDIOGRAM  (TEE);  Surgeon: Rexene Alberts, MD;  Location: [facility];  Service: Open Heart Surgery;  Laterality: N/A;    reports that she quit smoking about 46 years ago. Her smoking use included cigarettes. She has a 15.00 pack-year smoking history. She has never used smokeless tobacco. She reports that she does not drink alcohol and does not use drugs. family history includes Leukemia in her father. Allergies  Allergen Reactions   Aldactone [Spironolactone] Other (See Comments)    dyspnea   Amoxicillin Palpitations    Tachycardia Has patient had a PCN reaction causing immediate rash, facial/tongue/throat swelling, SOB or lightheadedness with hypotension: no Has patient had a PCN reaction causing severe rash involving mucus membranes or skin necrosis: {no Has patient had a PCN reaction that required hospitalization {no Has patient had a PCN reaction occurring within the last 10 years: {yes If all of the above answers are "NO", then may proceed with Cephalosporin use.   Diltiazem Other (See Comments)    Causing headaches    Flagyl [Metronidazole Hcl] Other (See Comments)    Causing headaches     Flovent [Fluticasone Propionate] Other (See Comments)    Leg cramps   Gabapentin Swelling   Lyrica [Pregabalin] Swelling   Quinidine Diarrhea and Other (See Comments)    Fever diarrhea   Simvastatin Other (See Comments)    Leg pain, myalgia   Tramadol Nausea Only   Verapamil Other (See Comments)    myalgias   Amlodipine     Low extremity edema   Crestor [Rosuvastatin]     myalgia   Livalo [Pitavastatin]     myalgias   Zetia [Ezetimibe]     LEG CRAMPS    Benazepril Hcl Cough   Ciprofloxacin Diarrhea   Codeine Nausea Only   Nitrofurantoin Monohyd Macro Nausea Only    Review of Systems  Constitutional:  Negative for chills and fever.  Respiratory:  Negative for shortness of breath.   Cardiovascular:  Negative for chest pain.  Genitourinary:  Positive for dysuria and frequency. Negative for  flank pain and hematuria.      Objective:     BP 110/66 (BP Location: Left Arm, Patient Position: Sitting, Cuff Size: Normal)   Pulse (!) 55   Temp 97.9 F (36.6 C) (Oral)   Ht '5\' 3"'$  (1.6 m)   Wt 137 lb 8 oz (62.4 kg)   SpO2 97%   BMI 24.36 kg/m  BP Readings from Last 3 Encounters:  07/27/22 110/66  07/21/22 108/68  07/19/22 124/64   Wt Readings from Last 3 Encounters:  07/27/22 137 lb 8 oz (62.4 kg)  07/21/22 140 lb (63.5 kg)  07/19/22 138 lb (62.6 kg)      Physical Exam Vitals reviewed.  Constitutional:      General: She is not in acute distress.    Appearance: Normal appearance. She is not ill-appearing.  Cardiovascular:     Rate and Rhythm: Normal rate.  Pulmonary:  Effort: Pulmonary effort is normal.     Breath sounds: Normal breath sounds. No wheezing or rales.  Neurological:     Mental Status: She is alert.      Results for orders placed or performed in visit on 07/27/22  POCT Urinalysis Dipstick (Automated)  Result Value Ref Range   Color, UA Yellow    Clarity, UA Clear    Glucose, UA Negative Negative   Bilirubin, UA Negative    Ketones, UA Negative    Spec Grav, UA 1.015 1.010 - 1.025   Blood, UA Negative    pH, UA 7.0 5.0 - 8.0   Protein, UA Negative Negative   Urobilinogen, UA 0.2 0.2 or 1.0 E.U./dL   Nitrite, UA Negative    Leukocytes, UA Negative Negative      The ASCVD Risk score (Arnett DK, et al., 2019) failed to calculate for the following reasons:   The 2019 ASCVD risk score is only valid for ages 27 to 24    Assessment & Plan:   #1 recurrent dysuria.  Patient has some chronic mild stress incontinence.  Urine dipstick today is completely clear as it was last visit as well.  Suspect she probably has element of atrophic vaginitis.  We did discuss possible trial of Estrace vaginal cream-but explained this may be expensive and cost may be prohibitive. -Start 2 g per vagina daily for 2 weeks and then 1 g per vagina 3 times  weekly -Consider moisturizer such as Replens -Keep area as dry as possible from urine  #2 chronic back pain.  Suspect lumbar stenosis.  She has pending follow-up with a new neurosurgeon next week.   Return in about 3 months (around 10/27/2022).    [name], MD

## 2022-07-30 ENCOUNTER — Other Ambulatory Visit (HOSPITAL_BASED_OUTPATIENT_CLINIC_OR_DEPARTMENT_OTHER): Payer: Self-pay

## 2022-07-30 ENCOUNTER — Emergency Department (HOSPITAL_BASED_OUTPATIENT_CLINIC_OR_DEPARTMENT_OTHER)
Admission: EM | Admit: 2022-07-30 | Discharge: 2022-07-30 | Disposition: A | Discharge status: Home / Self Care | Payer: Medicare Other | Attending: Emergency Medicine | Admitting: Emergency Medicine

## 2022-07-30 ENCOUNTER — Encounter (HOSPITAL_BASED_OUTPATIENT_CLINIC_OR_DEPARTMENT_OTHER): Payer: Self-pay

## 2022-07-30 ENCOUNTER — Emergency Department (HOSPITAL_BASED_OUTPATIENT_CLINIC_OR_DEPARTMENT_OTHER): Payer: Medicare Other

## 2022-07-30 ENCOUNTER — Other Ambulatory Visit: Payer: Self-pay

## 2022-07-30 ENCOUNTER — Telehealth: Payer: Medicare Other

## 2022-07-30 DIAGNOSIS — N390 Urinary tract infection, site not specified: Secondary | ICD-10-CM | POA: Insufficient documentation

## 2022-07-30 DIAGNOSIS — R109 Unspecified abdominal pain: Secondary | ICD-10-CM | POA: Insufficient documentation

## 2022-07-30 DIAGNOSIS — N200 Calculus of kidney: Secondary | ICD-10-CM | POA: Diagnosis not present

## 2022-07-30 DIAGNOSIS — R300 Dysuria: Secondary | ICD-10-CM | POA: Diagnosis present

## 2022-07-30 LAB — BASIC METABOLIC PANEL
Anion gap: 12 (ref 5–15)
BUN: 16 mg/dL (ref 8–23)
CO2: 28 mmol/L (ref 22–32)
Calcium: 9.8 mg/dL (ref 8.9–10.3)
Chloride: 101 mmol/L (ref 98–111)
Creatinine, Ser: 1.07 mg/dL — ABNORMAL HIGH (ref 0.44–1.00)
GFR, Estimated: 51 mL/min — ABNORMAL LOW (ref >60)
Glucose, Bld: 103 mg/dL — ABNORMAL HIGH (ref 70–99)
Potassium: 3.6 mmol/L (ref 3.5–5.1)
Sodium: 141 mmol/L (ref 135–145)

## 2022-07-30 LAB — CBC
HCT: 37.1 % (ref 36.0–46.0)
Hemoglobin: 12.5 g/dL (ref 12.0–15.0)
MCH: 30.1 pg (ref 26.0–34.0)
MCHC: 33.7 g/dL (ref 30.0–36.0)
MCV: 89.4 fL (ref 80.0–100.0)
Platelets: 215 10*3/uL (ref 150–400)
RBC: 4.15 MIL/uL (ref 3.87–5.11)
RDW: 12.8 % (ref 11.5–15.5)
WBC: 9.1 10*3/uL (ref 4.0–10.5)
nRBC: 0 % (ref 0.0–0.2)

## 2022-07-30 LAB — URINALYSIS, ROUTINE W REFLEX MICROSCOPIC
Bilirubin Urine: NEGATIVE
Glucose, UA: NEGATIVE mg/dL
Hgb urine dipstick: NEGATIVE
Ketones, ur: NEGATIVE mg/dL
Nitrite: NEGATIVE
Protein, ur: NEGATIVE mg/dL
Specific Gravity, Urine: 1.006 (ref 1.005–1.030)
pH: 6.5 (ref 5.0–8.0)

## 2022-07-30 MED ORDER — SULFAMETHOXAZOLE-TRIMETHOPRIM 800-160 MG PO TABS
1.0000 | ORAL_TABLET | Freq: Two times a day (BID) | ORAL | 0 refills | Status: DC
  Filled 2022-07-30: qty 20, 10d supply, fill #0

## 2022-07-30 MED ORDER — PHENAZOPYRIDINE HCL 200 MG PO TABS
200.0000 mg | ORAL_TABLET | Freq: Three times a day (TID) | ORAL | 0 refills | Status: DC | PRN
  Filled 2022-07-30: qty 9, 3d supply, fill #0

## 2022-07-30 MED ORDER — SULFAMETHOXAZOLE-TRIMETHOPRIM 800-160 MG PO TABS: 1.0000 | ORAL_TABLET | Freq: Two times a day (BID) | ORAL | 0 refills | Status: DC

## 2022-07-30 MED ORDER — PHENAZOPYRIDINE HCL 200 MG PO TABS: 200.0000 mg | ORAL_TABLET | Freq: Three times a day (TID) | ORAL | 0 refills | Status: DC | PRN

## 2022-07-30 NOTE — ED Provider Notes (Signed)
[facility] EMERGENCY DEPT Provider Note   CSN: [id number] Arrival date & time: 07/30/22  1412     History  Chief Complaint  Patient presents with   Dysuria    Catherine [name] is a 87 y.o. female.  Patient complains of discomfort with urination.  Patient reports that she has had the symptoms for the past 2 months she has been seen by her primary care doctor and treated with cephalexin.  Patient reports symptoms have continued.  She was seen by her primary care doctor on 10/31 for recheck of the same symptom had a negative UA.  Patient has had a past medical history of a kidney stone in the past.  Patient reports that she has been told that she had an extra kidney.  The history is provided by the patient and the spouse. No language interpreter was used.  Dysuria Pain quality:  Aching and burning Pain severity:  Moderate Onset quality:  Gradual Duration:  2 months Timing:  Constant Progression:  Worsening Chronicity:  New Recent urinary tract infections: no   Relieved by:  Nothing Worsened by:  Nothing Ineffective treatments:  None tried Urinary symptoms: frequent urination   Associated symptoms: no abdominal pain   Risk factors: recurrent urinary tract infections        Home Medications Prior to Admission medications   Medication Sig Start Date End Date Taking? Authorizing Provider  acetaminophen (TYLENOL) 500 MG tablet Take 1,000 mg by mouth every 6 (six) hours as needed for mild pain.    [provider]  albuterol (PROVENTIL) (2.5 MG/3ML) 0.083% nebulizer solution Take 3 mLs (2.5 mg total) by nebulization every 4 (four) hours as needed for wheezing or shortness of breath. 01/12/22 01/12/23  Caren Griffins, MD  albuterol (VENTOLIN HFA) 108 (90 Base) MCG/ACT inhaler INHALE 2 PUFFS INTO THE LUNGS EVERY 4 HOURS AS NEEDED FOR AHEEZING OR SHORTNESS OF BREATH 11/25/21   Tanda Rockers, MD  bumetanide (BUMEX) 2 MG tablet Please take '2mg'$  daily in the morning  and then '2mg'$  in the afternoon on [place], [place] 07/21/22   [name], MD  cholecalciferol (VITAMIN D3) 25 MCG (1000 UNIT) tablet Take 1,000 Units by mouth daily.    [provider]  diazepam (VALIUM) 5 MG tablet TAKE ONE TABLET AT BEDTIME 05/04/22   Burchette, Alinda Sierras, MD  estradiol (ESTRACE VAGINAL) 0.1 MG/GM vaginal cream USE 2 grams per vagina daily for 2 weeks and then 1 gram per vagina three times per week. 07/27/22   Burchette, Alinda Sierras, MD  metolazone (ZAROXOLYN) 5 MG tablet TAKE 1 TABLET 30 MINUTES PRIOR TO BUMEX DOSE AS NEEDED FOR SWELLING 06/22/22   [name], MD  Multiple Vitamins-Minerals (HAIR/SKIN/NAILS/BIOTIN PO) Take 1 tablet by mouth daily.    [provider]  Multiple Vitamins-Minerals (MULTIVITAMIN ADULT EXTRA C) CHEW Chew 2 tablets by mouth daily.    [provider]  potassium chloride (KLOR-CON) 10 MEQ tablet TAKE UP TO 8 TABLETS DAILY AS DIRECTED 07/20/22   [name], MD  sacubitril-valsartan (ENTRESTO) 24-26 MG Take 1 tablet by mouth 2 (two) times daily. 04/27/22   [name], MD  warfarin (COUMADIN) 2 MG tablet Take 1 tablet daily or take as directed by anticoagulation clinic 05/04/22   [name], MD      Allergies    Aldactone [spironolactone], Amoxicillin, Diltiazem, Flagyl [metronidazole hcl], Flovent [fluticasone propionate], Gabapentin, Lyrica [pregabalin], Quinidine, Simvastatin, Tramadol, Verapamil, Amlodipine, Crestor [rosuvastatin], Livalo [pitavastatin], Zetia [ezetimibe], Benazepril  hcl, Ciprofloxacin, Codeine, and Nitrofurantoin monohyd macro    Review of Systems   Review of Systems  Gastrointestinal:  Negative for abdominal pain.  Genitourinary:  Positive for dysuria.  All other systems reviewed and are negative.   Physical Exam Updated Vital Signs BP 131/66 (BP Location: Right Arm)   Pulse 61   Temp 98.4 F (36.9 C) (Oral)   Resp 18   Ht '5\' 3"'$  (1.6 m)   Wt 62.4 kg   SpO2 95%    BMI 24.37 kg/m  Physical Exam Vitals and nursing note reviewed.  Constitutional:      Appearance: She is well-developed.  HENT:     Head: Normocephalic.  Cardiovascular:     Rate and Rhythm: Normal rate.  Pulmonary:     Effort: Pulmonary effort is normal.  Abdominal:     General: Abdomen is flat. There is no distension.     Palpations: Abdomen is soft.  Musculoskeletal:        General: Normal range of motion.     Cervical back: Normal range of motion.  Skin:    General: Skin is warm.  Neurological:     General: No focal deficit present.     Mental Status: She is alert and oriented to person, place, and time.     ED Results / Procedures / Treatments   Labs (all labs ordered are listed, but only abnormal results are displayed) Labs Reviewed  URINALYSIS, ROUTINE W REFLEX MICROSCOPIC - Abnormal; Notable for the following components:      Result Value   Color, Urine COLORLESS (*)    Leukocytes,Ua SMALL (*)    All other components within normal limits  BASIC METABOLIC PANEL - Abnormal; Notable for the following components:   Glucose, Bld 103 (*)    Creatinine, Ser 1.07 (*)    GFR, Estimated 51 (*)    All other components within normal limits  URINE CULTURE  CBC    EKG None  Radiology CT Renal Stone Study  Result Date: 07/30/2022 CLINICAL DATA:  Abdominal pain and dysuria for 2 months. EXAM: CT ABDOMEN AND PELVIS WITHOUT CONTRAST TECHNIQUE: Multidetector CT imaging of the abdomen and pelvis was performed following the standard protocol without IV contrast. RADIATION DOSE REDUCTION: This exam was performed according to the departmental dose-optimization program which includes automated exposure control, adjustment of the mA and/or kV according to patient size and/or use of iterative reconstruction technique. COMPARISON:  09/02/2019 FINDINGS: Lower chest: No acute findings. Hepatobiliary: No mass visualized on this unenhanced exam. Prior cholecystectomy. No evidence of  biliary obstruction. Pancreas: No mass or inflammatory process visualized on this unenhanced exam. Spleen:  Within normal limits in size. Adrenals/Urinary tract: A few tiny 1-2 mm left renal calculi are seen. No evidence of ureteral calculi or hydronephrosis. Unremarkable unopacified urinary bladder. Stomach/Bowel: No evidence of obstruction, inflammatory process, or abnormal fluid collections. Vascular/Lymphatic: No pathologically enlarged lymph nodes identified. No evidence of abdominal aortic aneurysm. Aortic atherosclerotic calcification incidentally noted. Reproductive: Prior hysterectomy noted. Adnexal regions are unremarkable in appearance. Other:  None. Musculoskeletal: No suspicious bone lesions identified. Mild compression fracture deformity of the L1 vertebral body is seen, which does not appear acute but is new since prior exam in 2020. Fusion hardware again seen at L5-S1. IMPRESSION: Tiny left renal calculi. No evidence of ureteral calculi, hydronephrosis, or other acute findings. Electronically Signed   By: Marlaine Hind M.D.   On: 07/30/2022 16:43    Procedures Procedures    Medications Ordered  in ED Medications - No data to display  ED Course/ Medical Decision Making/ A&P                           Medical Decision Making Patient complains of burning after urination  Amount and/or Complexity of Data Reviewed Independent Historian: spouse    Details: And is here with her husband who is supportive. External Data Reviewed: notes.    Details: A from visit on 1031 reviewed and is negative Labs: ordered. Decision-making details documented in ED Course.    Details: Shows small amount leukocytes Levan to 20 white blood cells Radiology: ordered and independent interpretation performed. Decision-making details documented in ED Course.    Details: CT renal shows no evidence of obstruction  Risk Risk Details: Urine culture is ordered I will treat patient for urinary tract infection I will  try giving her Pyridium for symptoms.  It sounds as if patient failed treatment with Keflex.  She is allergic to amoxicillin ciprofloxacin and Macrobid.  Will try treating patient with Bactrim.  I have advised her to recheck with her primary care physician.           Final Clinical Impression(s) / ED Diagnoses Final diagnoses:  Dysuria  Urinary tract infection without hematuria, site unspecified    Rx / DC Orders ED Discharge Orders          Ordered    sulfamethoxazole-trimethoprim (BACTRIM DS) 800-160 MG tablet  2 times daily        07/30/22 1710    phenazopyridine (PYRIDIUM) 200 MG tablet  3 times daily PRN        07/30/22 1710          An After Visit Summary was printed and given to the patient.     Fransico Meadow, PA-C 55/73/22 0254    Campbell Stall P, DO 27/06/23 1603

## 2022-07-30 NOTE — ED Triage Notes (Signed)
Patient here POV from Home.  Endorses for 2 Months some Dysuria. Prescribed Keflex originally. After finishing Antibiotics she began to have continued Pain with Urination despite Urinalysis resulting negative.  Then utilized Diflucan with No Relief. Seeking Relief from Symptoms.   NAD Noted during Triage. A&Ox4. GCS 15. Ambulatory.

## 2022-07-30 NOTE — Discharge Instructions (Addendum)
Follow up with your Physician  for recheck next week

## 2022-08-01 LAB — URINE CULTURE
Culture: <10000 — AB
Special Requests: NORMAL

## 2022-08-02 ENCOUNTER — Telehealth: Payer: Self-pay

## 2022-08-02 ENCOUNTER — Telehealth: Payer: Self-pay | Admitting: Pulmonary Disease

## 2022-08-02 NOTE — Telephone Encounter (Signed)
Pt called to report that she was seen in the ED on 07/30/22 for UTI and was prescribed Bactrim DS 80 mg-160 mg BID at that time. Pt reports already taking warfarin today. Instructed her to hold tomorrow's dose. Will check INR at anticoagulation clinic appointment on 11/8.

## 2022-08-02 NOTE — Chronic Care Management (AMB) (Signed)
    Chronic Care Management Pharmacy Assistant   Name: Catherine [name]  MRN: [id number] DOB: 10/08/1934  Reason for Encounter: Patient assistance application for Entresto 0177   Application completed to be mailed to patient 08/05/2022.   [facility] Pharmacist Assistant [phone]

## 2022-08-02 NOTE — Telephone Encounter (Signed)
Called and left a detailed message for [employer] regarding need for oxygen supplies to be picked up and that the order was sent over on 07/19/22 and the patient still has the equipment in her home.  I left our phone # an the patient's # for return call.  Will await return call.  Called and spoke with patient, she states that she is referring to the oxygen that she is not using, not CPAP supplies.  I advised her that we sent a fax over to [employer] to discontinue it on 07/11/22 and received a confirmation that it was sent successfully.  I let her know I would give them a call and see what is happening on their side and then call her back.  She verbalized understanding.

## 2022-08-03 ENCOUNTER — Ambulatory Visit (INDEPENDENT_AMBULATORY_CARE_PROVIDER_SITE_OTHER): Payer: Medicare Other | Admitting: Family Medicine

## 2022-08-03 ENCOUNTER — Encounter: Payer: Self-pay | Admitting: Family Medicine

## 2022-08-03 VITALS — BP 112/60 | HR 60 | Temp 97.4°F | Ht 63.0 in | Wt 134.9 lb

## 2022-08-03 DIAGNOSIS — M545 Low back pain, unspecified: Secondary | ICD-10-CM

## 2022-08-03 DIAGNOSIS — G8929 Other chronic pain: Secondary | ICD-10-CM | POA: Diagnosis not present

## 2022-08-03 DIAGNOSIS — R300 Dysuria: Secondary | ICD-10-CM

## 2022-08-03 DIAGNOSIS — N952 Postmenopausal atrophic vaginitis: Secondary | ICD-10-CM | POA: Diagnosis not present

## 2022-08-03 MED ORDER — PHENAZOPYRIDINE HCL 200 MG PO TABS: 200.0000 mg | ORAL_TABLET | Freq: Three times a day (TID) | ORAL | 1 refills | Status: DC | PRN

## 2022-08-03 NOTE — Progress Notes (Signed)
Established Patient Office Visit  Subjective   Patient ID: Catherine [name], female    DOB: 1934/10/10  Age: 87 y.o. MRN: [id number]  Chief Complaint  Patient presents with   Hospitalization Follow-up    HPI   Catherine [name] has chronic urinary symptoms.  Refer to most recent note for further details.  She was seen here late October and urinalysis was completely normal.  A few days later she felt like her symptoms were progressing and she went to the ER and was diagnosed with a "UTI ".  Her urinalysis there showed only small leukocytes with 11-20 white blood cells and she was started on Septra DS.  She was also started on Pyridium 200 mg every 8 hours.  However, her urine culture came back insignificant growth.  She had renal stone study which showed a couple small renal calculi left kidney but no ureteral stones.  CBC and basic metabolic panel unremarkable.  She continues to have some chronic discomfort with urination even in spite of multiple antibiotics and she has had multiple negative cultures.  We had discussed possible element of atrophic vaginitis last visit.  She only took the Estrace vaginal cream 1 day and states that "it did not help "and she stopped it.  She denies any gross hematuria.  She continues to have severe daily low back pain.  She has seen neurosurgeon in past.  Lumbar stenosis.  Bilateral pain radiating into the buttock.  Worse with walking.  Very limited in activities because of this.  Days in bed much of the day according to husband.  Past Medical History:  Diagnosis Date   Acute on chronic diastolic heart failure ([place])    AKI (acute kidney injury) ([place]) 06/22/2022   Aortic stenosis, mild 07/26/2017   Arthritis    Asthma    last attack 02/2015   Atrial fibrillation, chronic (HCC)    Cellulitis of left lower extremity 08/17/2016   Ulcer associated with severe venous insufficiency   Chronic anticoagulation    Chronic diastolic CHF (congestive heart failure) ([place])     Chronic kidney disease    "RIGHT MANY KIDNEY INFECTIONS AND STONES"   COPD (chronic obstructive pulmonary disease) (HCC)    Coronary artery disease    Dizziness    H/O: rheumatic fever    Heart murmur    Hypertension    PONV (postoperative nausea and vomiting)    ' SOMETIMES', BUT NOT ALWAYS"   S/P Maze operation for atrial fibrillation 10/14/2016   Complete bilateral atrial lesion set using cryothermy and bipolar radiofrequency ablation - atrial appendage was not treated due to previous surgical procedure (open mitral commissurotomy)   S/P mitral valve replacement with bioprosthetic valve 10/14/2016   29 mm Medtronic Mosaic porcine bioprosthetic tissue valve   Tricuspid regurgitation 07/14/2021   UTI (urinary tract infection)    Valvular heart disease    Has mitral stenosis with prior mitral commissurotomy in 1970   Past Surgical History:  Procedure Laterality Date   ABDOMINAL HYSTERECTOMY  1983   endometriosis   APPENDECTOMY     BACK SURGERY     neurosurgery x2   CARDIAC CATHETERIZATION     CARDIAC CATHETERIZATION N/A 08/03/2016   Procedure: Right/Left Heart Cath and Coronary Angiography;  Surgeon: Peter M Martinique, MD;  Location: [facility];  Service: Cardiovascular;  Laterality: N/A;   cataract surg     CHOLECYSTECTOMY N/A 05/30/2015   Procedure: LAPAROSCOPIC CHOLECYSTECTOMY WITH INTRAOPERATIVE CHOLANGIOGRAM;  Surgeon: Excell Seltzer, MD;  Location: [place] [facility];  Service: General;  Laterality: N/A;   COLONOSCOPY     CORONARY ARTERY BYPASS GRAFT N/A 10/14/2016   Procedure: CORONARY ARTERY BYPASS GRAFTING (CABG);  Surgeon: Rexene Alberts, MD;  Location: [facility];  Service: Open Heart Surgery;  Laterality: N/A;   EYE SURGERY     MAZE N/A 10/14/2016   Procedure: MAZE;  Surgeon: Rexene Alberts, MD;  Location: [place];  Service: Open Heart Surgery;  Laterality: N/A;   MITRAL VALVE REPLACEMENT N/A 10/14/2016   Procedure: REDO MITRAL VALVE REPLACEMENT (MVR);  Surgeon: Rexene Alberts, MD;   Location: [place] [place];  Service: Open Heart Surgery;  Laterality: N/A;   MITRAL VALVE SURGERY Left 1970   Open mitral commissurotomy via left thoracotomy approach   TEE WITHOUT CARDIOVERSION N/A 07/05/2016   Procedure: TRANSESOPHAGEAL ECHOCARDIOGRAM (TEE);  Surgeon: [name], MD;  Location: [facility];  Service: Cardiovascular;  Laterality: N/A;   TEE WITHOUT CARDIOVERSION N/A 10/14/2016   Procedure: TRANSESOPHAGEAL ECHOCARDIOGRAM (TEE);  Surgeon: Rexene Alberts, MD;  Location: [place];  Service: Open Heart Surgery;  Laterality: N/A;    reports that she quit smoking about 46 years ago. Her smoking use included cigarettes. She has a 15.00 pack-year smoking history. She has never used smokeless tobacco. She reports that she does not drink alcohol and does not use drugs. family history includes Leukemia in her father. Allergies  Allergen Reactions   Aldactone [Spironolactone] Other (See Comments)    dyspnea   Amoxicillin Palpitations    Tachycardia Has patient had a PCN reaction causing immediate rash, facial/tongue/throat swelling, SOB or lightheadedness with hypotension: no Has patient had a PCN reaction causing severe rash involving mucus membranes or skin necrosis: {no Has patient had a PCN reaction that required hospitalization {no Has patient had a PCN reaction occurring within the last 10 years: {yes If all of the above answers are "NO", then may proceed with Cephalosporin use.   Diltiazem Other (See Comments)    Causing headaches    Flagyl [Metronidazole Hcl] Other (See Comments)    Causing headaches     Flovent [Fluticasone Propionate] Other (See Comments)    Leg cramps   Gabapentin Swelling   Lyrica [Pregabalin] Swelling   Quinidine Diarrhea and Other (See Comments)    Fever diarrhea   Simvastatin Other (See Comments)    Leg pain, myalgia   Tramadol Nausea Only   Verapamil Other (See Comments)    myalgias   Amlodipine     Low extremity edema   Crestor [Rosuvastatin]      myalgia   Livalo [Pitavastatin]     myalgias   Zetia [Ezetimibe]     LEG CRAMPS    Benazepril Hcl Cough   Ciprofloxacin Diarrhea   Codeine Nausea Only   Nitrofurantoin Monohyd Macro Nausea Only    Review of Systems  Constitutional:  Negative for chills and fever.  Cardiovascular:  Negative for chest pain.  Genitourinary:  Positive for dysuria. Negative for flank pain and hematuria.  Musculoskeletal:  Positive for back pain.      Objective:     BP 112/60 (BP Location: Left Arm, Patient Position: Sitting, Cuff Size: Normal)   Pulse 60   Temp (!) 97.4 F (36.3 C) (Oral)   Ht '5\' 3"'$  (1.6 m)   Wt 134 lb 14.4 oz (61.2 kg)   SpO2 95%   BMI 23.90 kg/m  BP Readings from Last 3 Encounters:  08/03/22 112/60  07/30/22 127/71  07/27/22 110/66  Wt Readings from Last 3 Encounters:  08/03/22 134 lb 14.4 oz (61.2 kg)  07/30/22 137 lb 9.1 oz (62.4 kg)  07/27/22 137 lb 8 oz (62.4 kg)      Physical Exam Vitals reviewed.  Constitutional:      Appearance: Normal appearance.  Cardiovascular:     Rate and Rhythm: Normal rate.  Pulmonary:     Effort: Pulmonary effort is normal.     Breath sounds: Normal breath sounds.  Neurological:     Mental Status: She is alert.      No results found for any visits on 08/03/22.  Last CBC Lab Results  Component Value Date   WBC 9.1 07/30/2022   HGB 12.5 07/30/2022   HCT 37.1 07/30/2022   MCV 89.4 07/30/2022   MCH 30.1 07/30/2022   RDW 12.8 07/30/2022   PLT 215 40/81/4481   Last metabolic panel Lab Results  Component Value Date   GLUCOSE 103 (H) 07/30/2022   NA 141 07/30/2022   K 3.6 07/30/2022   CL 101 07/30/2022   CO2 28 07/30/2022   BUN 16 07/30/2022   CREATININE 1.07 (H) 07/30/2022   GFRNONAA 51 (L) 07/30/2022   CALCIUM 9.8 07/30/2022   PROT 6.2 06/14/2022   ALBUMIN 4.3 06/14/2022   LABGLOB 1.9 06/14/2022   AGRATIO 2.3 (H) 06/14/2022   BILITOT 0.6 06/14/2022   ALKPHOS 65 06/14/2022   AST 28 06/14/2022   ALT  13 06/14/2022   ANIONGAP 12 07/30/2022      The ASCVD Risk score (Arnett DK, et al., 2019) failed to calculate for the following reasons:   The 2019 ASCVD risk score is only valid for ages 64 to 23    Assessment & Plan:   #1 recurrent/chronic dysuria.  Repeat urine culture through ER again reveals no significant growth.  We do not see any indication for her to continue with the Septra DS at this time.  She did seem to get some benefit potentially from Pyridium and we went ahead and refilled that.  Stressed importance of good hydration.  We have strongly advocated that she try once more topical estrogen.  We explained that this would take potentially several weeks to see any potential benefit.  #2 chronic low back pain with history of known lumbar stenosis.  Patient has daily pain.  Minimal relief with Tylenol.  Previous intolerance with gabapentin, Lyrica, codeine, tramadol which limits medication options.  She cannot take nonsteroidals.  No follow-ups on file.    [name], MD

## 2022-08-04 ENCOUNTER — Telehealth: Payer: Self-pay | Admitting: Licensed Clinical Social Worker

## 2022-08-04 ENCOUNTER — Ambulatory Visit (INDEPENDENT_AMBULATORY_CARE_PROVIDER_SITE_OTHER): Payer: Medicare Other

## 2022-08-04 DIAGNOSIS — Z7901 Long term (current) use of anticoagulants: Secondary | ICD-10-CM

## 2022-08-04 DIAGNOSIS — I10 Essential (primary) hypertension: Secondary | ICD-10-CM | POA: Diagnosis not present

## 2022-08-04 DIAGNOSIS — I482 Chronic atrial fibrillation, unspecified: Secondary | ICD-10-CM | POA: Diagnosis not present

## 2022-08-04 DIAGNOSIS — I5043 Acute on chronic combined systolic (congestive) and diastolic (congestive) heart failure: Secondary | ICD-10-CM | POA: Diagnosis not present

## 2022-08-04 DIAGNOSIS — M5416 Radiculopathy, lumbar region: Secondary | ICD-10-CM | POA: Diagnosis not present

## 2022-08-04 LAB — POCT INR: INR: 3 (ref 2.0–3.0)

## 2022-08-04 NOTE — Patient Instructions (Addendum)
Pt was seen in the ED on 07/30/22 for UTI and was prescribed Bactrim DS 80 mg-160 mg BID at that time. PCP discontinued it on 11/7. On 11/6, pt was was instructed to hold warfarin on 11/7.    INR 3.0 today. Continue to take 1/2 tablet every day except take 1 tablet every day on Mondays, Thursdays, and Saturdays. Recheck in 4 weeks, on 09/01/22 at 11:30.

## 2022-08-04 NOTE — Patient Outreach (Signed)
  Care Coordination   08/04/2022 Name: Catherine [name] MRN: [id number] DOB: [birth year]   Care Coordination Outreach Attempts:  An unsuccessful telephone outreach was attempted today to offer the patient information about available care coordination services as a benefit of their health plan.   Follow Up Plan:  Additional outreach attempts will be made to offer the patient care coordination information and services.   Encounter Outcome:  No Answer  Care Coordination Interventions Activated:  No   Care Coordination Interventions:  No, not indicated    Christa See, [email or web address], [email or web address] Phone [phone] 11:45 AM

## 2022-08-04 NOTE — Progress Notes (Signed)
Pt was seen in the ED on 07/30/22 for UTI and was prescribed Bactrim DS 80 mg-160 mg BID at that time. PCP discontinued it on 11/7. On 11/6, pt was was instructed to hold warfarin on 11/7.    INR 3.0 today. Continue to take 1/2 tablet every day except take 1 tablet every day on Mondays, Thursdays, and Saturdays. Recheck in 4 weeks, on 09/01/22 at 11:30.

## 2022-08-05 ENCOUNTER — Other Ambulatory Visit (HOSPITAL_BASED_OUTPATIENT_CLINIC_OR_DEPARTMENT_OTHER): Payer: Self-pay | Admitting: Cardiovascular Disease

## 2022-08-05 DIAGNOSIS — I5032 Chronic diastolic (congestive) heart failure: Secondary | ICD-10-CM

## 2022-08-05 LAB — BASIC METABOLIC PANEL
BUN/Creatinine Ratio: 15 (ref 12–28)
BUN: 26 mg/dL (ref 8–27)
CO2: 23 mmol/L (ref 20–29)
Calcium: 9.5 mg/dL (ref 8.7–10.3)
Chloride: 101 mmol/L (ref 96–106)
Creatinine, Ser: 1.78 mg/dL — ABNORMAL HIGH (ref 0.57–1.00)
Glucose: 85 mg/dL (ref 70–99)
Potassium: 5 mmol/L (ref 3.5–5.2)
Sodium: 140 mmol/L (ref 134–144)
eGFR: 28 mL/min/{1.73_m2} — ABNORMAL LOW (ref >59)

## 2022-08-05 NOTE — Telephone Encounter (Signed)
RX instructions say to take 30 min prior to [place] dose. Dr. Blenda Mounts note says to Continue taking once per week. Can you clarify this Rx please?

## 2022-08-09 ENCOUNTER — Telehealth (HOSPITAL_BASED_OUTPATIENT_CLINIC_OR_DEPARTMENT_OTHER): Payer: Self-pay | Admitting: *Deleted

## 2022-08-09 NOTE — Telephone Encounter (Signed)
Advised patient, verbalized understanding  

## 2022-08-09 NOTE — Telephone Encounter (Signed)
-----   Message from [name], MD sent at 08/09/2022  4:23 PM EST ----- Labs show that she is getting too dry.  Her kidney function is worsening again. Only take the metolazone once per week if she needs it.  Her urinalysis was negative for infection.

## 2022-08-12 ENCOUNTER — Telehealth: Payer: Self-pay | Admitting: Cardiovascular Disease

## 2022-08-12 ENCOUNTER — Other Ambulatory Visit: Payer: Self-pay | Admitting: Family Medicine

## 2022-08-12 ENCOUNTER — Telehealth: Payer: Self-pay | Admitting: Pulmonary Disease

## 2022-08-12 NOTE — Telephone Encounter (Signed)
Pt is requesting call back to discuss an oxygen tank that she has and is trying to return.

## 2022-08-12 NOTE — Telephone Encounter (Signed)
Called and spoke with pt who states her O2 still has not been picked up by DME and she is still receiving a bill for it. Stated to pt that I would call DME to further figure out why her O2 had not been picked up and she verbalized understanding.  Called [employer] and spoke with [facility] who stated that she did find the order after further looking for it. She stated she would get it taken care of for pt's O2 to be picked up. nothing further needed.

## 2022-08-12 NOTE — Telephone Encounter (Signed)
Returned call to patient to discuss questions regarding oxygen tanks. Patient states that she is trying to reach Dr. Levonne Lapping. She states that when she saw Dr. Levonne Lapping he told he would help her get her her oxygen tank picked up and no one has been by to get them. Gave the patient the correct office number to call to follow up.

## 2022-08-16 ENCOUNTER — Telehealth (HOSPITAL_BASED_OUTPATIENT_CLINIC_OR_DEPARTMENT_OTHER): Payer: Self-pay | Admitting: *Deleted

## 2022-08-16 MED ORDER — ENTRESTO 24-26 MG PO TABS: 1.0000 | ORAL_TABLET | Freq: Two times a day (BID) | ORAL | 3 refills | Status: DC

## 2022-08-16 NOTE — Telephone Encounter (Signed)
[employer] patient assistance papers filled out and waiting for Dr Oval Linsey to sign

## 2022-08-18 NOTE — Telephone Encounter (Signed)
Faxed 11/20, confirmation received

## 2022-08-23 ENCOUNTER — Telehealth: Payer: Self-pay | Admitting: Pharmacist

## 2022-08-23 NOTE — Chronic Care Management (AMB) (Signed)
    Chronic Care Management Pharmacy Assistant   Name: Catherine [name]  MRN: [id number] DOB: November 15, 1934  08/24/2022 APPOINTMENT REMINDER  Catherine [name] was reminded to have all medications, supplements and any blood glucose and blood pressure readings available for review with [name], Pharm. D, at her telephone visit on 08/24/2022 at 4:00.  Care Gaps: AWV - scheduled 04/14/2023  Last BP - 112/60 on 08/03/2022 Covid - overdue AWV - upcoming  Star Rating Drug: None  Any gaps in medications fill history? No  [facility]  Catering manager [phone]

## 2022-08-24 ENCOUNTER — Ambulatory Visit (INDEPENDENT_AMBULATORY_CARE_PROVIDER_SITE_OTHER): Payer: Medicare Other | Admitting: Pharmacist

## 2022-08-24 DIAGNOSIS — J441 Chronic obstructive pulmonary disease with (acute) exacerbation: Secondary | ICD-10-CM

## 2022-08-24 DIAGNOSIS — I10 Essential (primary) hypertension: Secondary | ICD-10-CM

## 2022-08-24 NOTE — Telephone Encounter (Signed)
Received notification approved through 09/27/2023, advised patient

## 2022-08-24 NOTE — Progress Notes (Unsigned)
Chronic Care Management Pharmacy Note  08/25/2022 Name:  Catherine [name] MRN:  [id number] DOB:  October 24, 1934  Summary: BP at goal < 140/90 per recent office readings and is no longer having lows at home Pt is not on a maintenance inhaler  Recommendations/Changes made from today's visit: -Recommended bringing BP cuff to next office visit to ensure accuracy -Recommend repeat spirometry -Recommended adding LAMA/LABA inhaler back to regimen and sent a message to pulmonologist -Requested referral for new neurosurgeon/spine specialist per pt request  Plan: Follow up pending maintenance inhaler  Subjective: Catherine [name] is an 87 y.o. year old female who is a primary patient of Burchette, Alinda Sierras, MD.  The CCM team was consulted for assistance with disease management and care coordination needs.    Engaged with patient by telephone for follow up visit in response to provider referral for pharmacy case management and/or care coordination services.   Consent to Services:  The patient was given information about Chronic Care Management services, agreed to services, and gave verbal consent prior to initiation of services.  Please see initial visit note for detailed documentation.   Patient Care Team: [name], MD as PCP - General (Family Medicine) [name], MD as PCP - Cardiology (Cardiology) Viona Gilmore, [facility] as Pharmacist (Pharmacist)  Recent office visits: 08/04/22 Lorenda Peck, RN: Patient presented for anti-coag visit. INR 3.0, goal 2-3. Continued warfarin 2 mg (2 mg x 1) every Mon, Thu, Sat; 1 mg (2 mg x 0.5) all other days. Follow up in 4 weeks.  08/03/22 [name] MD: Patient presented for dysuria. Recommended restarting topical estrogen. D/c'd Bactrim.  07/27/22 [name] MD: Patient presented for dysuria. Prescribed Estrace vaginal cream daily x 2 weeks then 1 g three times weekly. Follow up in 3 months.   07/06/22 [name] MD: Patient  presented for dysuria. Influenza vaccine administered.  05/28/22 [name] MD: Patient presented for yeast vaginitis.  Referred to skin surgery center for facial lesion.  04/09/22 Rolene Arbour, LPN: Patient presented for AWV.  04/07/22 [name] MD: Patient presented for abrasion of left lower extremity.  03/08/22 [name] MD: Patient presented for bilateral back pain and fluid gain.  Patient got a prescription filled for Entresto and also budesonide 3 mg twice daily, which may have been filled in error in place of the Bumex.   02/24/22 [name] MD: Patient presented for back injury. Prescribed tramadol PRN.   Recent consult visits: 07/21/22 [name], MD (cardiology): Patient presented for Afib follow up. No medication changes. Follow up in 2 months.  07/19/22 Chesley Mires, MD (pulmonary): Patient presented for COPD follow up. Patient stopped Bevespi on her own due to leg cramps. Follow up in 2-3 months.  06/22/22 [name], MD (cardiology): Patient presented for Afib and CHF follow up. Decreased Bumex to once daily. Recommended only taking metolazone as needed not once a week.  06/01/22 Christinia Gully, MD (pulmonary): Patient presented for COPD follow up. Follow up in 3 months.  05/07/22 telephone encounter (Cardiology): Held rosuvastatin due to leg pain and doxazosin due to SBP < 100.   04/12/22 [name] Shark, NP (cardiology): Patient presented for CAD and Afib follow up.  Prescribed metolazone once per week on Fridays. Continued Bumex dose. Lab in 2 weeks.  03/19/22 [name], NP (cardiology): Patient presented for Afib follow up. Referred to [facility].  03/04/22 [name], MD (cardiology): Patient presented for A fib follow up.  Switched torsemide to Bumex 2 mg BID.  Follow up in 2 weeks.  03/02/22 Christinia Gully, MD (Pulmonary Disease): Patient presented for COPD GOLD II follow up. Continued Symbicort. Follow up in 3 months.   Hospital  visits: Presented to [facility] ED on 07/30/2022 due to dysuria. Present for 2 hours. New?Medications Started at [facility] Discharge:?? Bactrim DS Pyridium Medication Changes at Hospital Discharge: None Medications Discontinued at Hospital Discharge: No medications discontinued Medications that remain the same after Hospital Discharge:??  -All other medications will remain the same.    Objective:  Lab Results  Component Value Date   CREATININE 1.78 (H) 08/04/2022   BUN 26 08/04/2022   GFR 46.59 (L) 03/17/2022   GFRNONAA 51 (L) 07/30/2022   GFRAA 67 10/29/2020   NA 140 08/04/2022   K 5.0 08/04/2022   CALCIUM 9.5 08/04/2022   CO2 23 08/04/2022   GLUCOSE 85 08/04/2022    Lab Results  Component Value Date/Time   HGBA1C 5.3 10/12/2016 01:59 PM   GFR 46.59 (L) 03/17/2022 01:31 PM   GFR 62.02 12/21/2021 02:37 PM   MICROALBUR 1.6 12/21/2021 02:37 PM    Last diabetic Eye exam: No results found for: "HMDIABEYEEXA"  Last diabetic Foot exam: No results found for: "HMDIABFOOTEX"   Lab Results  Component Value Date   CHOL 297 (H) 06/14/2022   HDL 44 06/14/2022   LDLCALC 195 (H) 06/14/2022   LDLDIRECT 68.0 09/24/2019   TRIG 295 (H) 06/14/2022   CHOLHDL 6.8 (H) 06/14/2022       Latest Ref Rng & Units 06/14/2022   10:32 AM 01/05/2022    1:16 AM 12/21/2021    2:37 PM  Hepatic Function  Total Protein 6.0 - 8.5 g/dL 6.2  7.1  6.5   Albumin 3.7 - 4.7 g/dL 4.3  4.1  4.1   AST 0 - 40 IU/L _0 ALT 0 - 32 IU/L _1 Alk Phosphatase 44 - 121 IU/L 65  87  63   Total Bilirubin 0.0 - 1.2 mg/dL 0.6  0.2  0.5     Lab Results  Component Value Date/Time   TSH 1.716 01/05/2022 10:41 AM   TSH 3.39 11/30/2021 11:36 AM   TSH 3.95 07/09/2020 11:35 AM   FREET4 0.81 03/03/2015 10:51 AM   FREET4 0.81 03/04/2014 11:01 AM       Latest Ref Rng & Units 07/30/2022    2:41 PM 04/19/2022   11:39 AM 01/18/2022    1:08 PM  CBC  WBC 4.0 - 10.5 K/uL 9.1  7.2  16.6    Hemoglobin 12.0 - 15.0 g/dL 12.5  12.6  15.1   Hematocrit 36.0 - 46.0 % 37.1  37.4  41.4   Platelets 150 - 400 K/uL 215  185  180     Lab Results  Component Value Date/Time   VD25OH 25.0 (L) 01/20/2021 12:09 PM   VD25OH 36 06/06/2012 11:09 AM    Clinical ASCVD: Yes  The ASCVD Risk score (Arnett DK, et al., 2019) failed to calculate for the following reasons:   The 2019 ASCVD risk score is only valid for ages 78 to 19       04/09/2022   12:42 PM 11/06/2021   11:01 AM 04/07/2021    9:09 AM  Depression screen PHQ 2/9  Decreased Interest 0 0 0  Down, Depressed, Hopeless 0 0 0  PHQ - 2 Score 0 0 0    CHA2DS2/VAS Stroke Risk Points  Current as  of 10 minutes ago     6 >= 2 Points: High Risk  1 - 1.99 Points: Medium Risk  0 Points: Low Risk    Last Change: N/A      Details    This score determines the patient's risk of having a stroke if the  patient has atrial fibrillation.       Points Metrics  1 Has Congestive Heart Failure:  Yes    Current as of 10 minutes ago  1 Has Vascular Disease:  Yes    Current as of 10 minutes ago  1 Has Hypertension:  Yes    Current as of 10 minutes ago  2 Age:  23    Current as of 10 minutes ago  0 Has Diabetes:  No    Current as of 10 minutes ago  0 Had Stroke:  No  Had TIA:  No  Had Thromboembolism:  No    Current as of 10 minutes ago  1 Female:  Yes    Current as of 10 minutes ago         Social History   Tobacco Use  Smoking Status Former   Packs/day: 1.00   Years: 15.00   Total pack years: 15.00   Types: Cigarettes   Quit date: 09/28/1975   Years since quitting: 46.9  Smokeless Tobacco Never   BP Readings from Last 3 Encounters:  08/03/22 112/60  07/30/22 127/71  07/27/22 110/66   Pulse Readings from Last 3 Encounters:  08/03/22 60  07/30/22 64  07/27/22 (!) 55   Wt Readings from Last 3 Encounters:  08/03/22 134 lb 14.4 oz (61.2 kg)  07/30/22 137 lb 9.1 oz (62.4 kg)  07/27/22 137 lb 8 oz (62.4 kg)   BMI Readings  from Last 3 Encounters:  08/03/22 23.90 kg/m  07/30/22 24.37 kg/m  07/27/22 24.36 kg/m    Assessment/Interventions: Review of patient past medical history, allergies, medications, health status, including review of consultants reports, laboratory and other test data, was performed as part of comprehensive evaluation and provision of chronic care management services.   SDOH:  (Social Determinants of Health) assessments and interventions performed: Yes (last 04/06/22) SDOH Interventions    Flowsheet Row Clinical Support from 04/09/2022 in [place] at [place] Management from 11/06/2021 in [place] at [place] from 04/07/2021 in [place] at [place] from 07/01/2020 in [place] at [place] Management from 06/17/2020 in [place] at [place] from 03/25/2017 in [place] Interventions        Food Insecurity Interventions Intervention Not Indicated Intervention Not Indicated Intervention Not Indicated -- -- --  Housing Interventions Intervention Not Indicated Intervention Not Indicated Intervention Not Indicated -- -- --  Transportation Interventions Intervention Not Indicated Intervention Not Indicated -- -- Intervention Not Indicated --  Depression Interventions/Treatment  -- -- -- PHQ2-9 Score <4 Follow-up Not Indicated -- --  [No treatment needed. ]  Financial Strain Interventions Intervention Not Indicated Intervention Not Indicated -- -- Other (Comment)  [Referred to [facility]] --  Physical Activity Interventions Intervention Not Indicated Other (Comments)  [reviewed Silver Sneakers when able] Intervention Not Indicated -- -- --  Stress Interventions Intervention Not Indicated Intervention Not Indicated -- -- -- --  Social Connections Interventions Intervention Not Indicated -- Intervention Not Indicated -- -- --      SDOH  Screenings   Food Insecurity: No Food Insecurity (04/09/2022)  Housing:  Low Risk  (04/09/2022)  Transportation Needs: No Transportation Needs (04/09/2022)  Alcohol Screen: Low Risk  (11/06/2021)  Depression (PHQ2-9): Low Risk  (04/09/2022)  Financial Resource Strain: Low Risk  (04/09/2022)  Physical Activity: Insufficiently Active (04/09/2022)  Social Connections: Socially Integrated (04/09/2022)  Stress: No Stress Concern Present (04/09/2022)  Tobacco Use: Medium Risk (08/03/2022)    CCM Care Plan  Allergies  Allergen Reactions   Aldactone [Spironolactone] Other (See Comments)    dyspnea   Amoxicillin Palpitations    Tachycardia Has patient had a PCN reaction causing immediate rash, facial/tongue/throat swelling, SOB or lightheadedness with hypotension: no Has patient had a PCN reaction causing severe rash involving mucus membranes or skin necrosis: {no Has patient had a PCN reaction that required hospitalization {no Has patient had a PCN reaction occurring within the last 10 years: {yes If all of the above answers are "NO", then may proceed with Cephalosporin use.   Diltiazem Other (See Comments)    Causing headaches    Flagyl [Metronidazole Hcl] Other (See Comments)    Causing headaches     Flovent [Fluticasone Propionate] Other (See Comments)    Leg cramps   Gabapentin Swelling   Lyrica [Pregabalin] Swelling   Quinidine Diarrhea and Other (See Comments)    Fever diarrhea   Simvastatin Other (See Comments)    Leg pain, myalgia   Tramadol Nausea Only   Verapamil Other (See Comments)    myalgias   Amlodipine     Low extremity edema   Crestor [Rosuvastatin]     myalgia   Livalo [Pitavastatin]     myalgias   Zetia [Ezetimibe]     LEG CRAMPS    Benazepril Hcl Cough   Ciprofloxacin Diarrhea   Codeine Nausea Only   Nitrofurantoin Monohyd Macro Nausea Only    Medications Reviewed Today     Reviewed by [name], MD (Physician) on 08/03/22 at 1431  Med List  Status: <None>   Medication Order Taking? Sig Documenting Provider Last Dose Status Informant  acetaminophen (TYLENOL) 500 MG tablet [id number] Yes Take 1,000 mg by mouth every 6 (six) hours as needed for mild pain. [provider] Taking Active Self, Pharmacy Records  albuterol (PROVENTIL) (2.5 MG/3ML) 0.083% nebulizer solution [id number] Yes Take 3 mLs (2.5 mg total) by nebulization every 4 (four) hours as needed for wheezing or shortness of breath. Caren Griffins, MD Taking Active   albuterol (VENTOLIN HFA) 108 (90 Base) MCG/ACT inhaler [id number] Yes INHALE 2 PUFFS INTO THE LUNGS EVERY 4 HOURS AS NEEDED FOR AHEEZING OR SHORTNESS OF Hulan Amato, MD Taking Active Self, Pharmacy Records  bumetanide [facility]) 2 MG tablet [id number] Yes Please take 74m daily in the morning and then 252min the afternoon on [facility] FRWendi SnipesaSkeet LatchMD Taking Active   cholecalciferol (VITAMIN D3) 25 MCG (1000 UNIT) tablet [id number] Take 1,000 Units by mouth daily. [provider] Taking Active Self, Pharmacy Records  diazepam (VALIUM) 5 MG tablet [id number] TAKE ONE TABLET AT BEDTIME Burchette, BrAlinda SierrasMD Taking Active   estradiol (ESTRACE VAGINAL) 0.1 MG/GM vaginal cream [id number] USE 2 grams per vagina daily for 2 weeks and then 1 gram per vagina three times per week. BuEulas PostMD Taking Active   metolazone (ZAROXOLYN) 5 MG tablet [id number] TAKE 1 TABLET 30 MINUTES PRIOR TO BUMEX DOSE AS NEEDED FOR SWELLING RaSkeet LatchMD Taking Active   Multiple Vitamins-Minerals (HAIR/SKIN/NAILS/BIOTIN PO) [id number] Take 1 tablet  by mouth daily. [provider] Taking Active Self, Pharmacy Records  Multiple Vitamins-Minerals (MULTIVITAMIN ADULT Williemae Natter) CHEW [id number] Yes Chew 2 tablets by mouth daily. [provider] Taking Active Self, Pharmacy Records  phenazopyridine (PYRIDIUM) 200 MG tablet [id number]  Take 1 tablet (200 mg total)  by mouth 3 (three) times daily as needed for pain. [name], MD  Active   potassium chloride (KLOR-CON) 10 MEQ tablet [id number] Yes TAKE UP TO 8 TABLETS DAILY AS DIRECTED [name], MD Taking Active   sacubitril-valsartan [facility]) 24-26 MG [id number] Yes Take 1 tablet by mouth 2 (two) times daily. [name], MD Taking Active   warfarin (COUMADIN) 2 MG tablet [id number] Yes Take 1 tablet daily or take as directed by anticoagulation clinic [name], MD Taking Active             Patient Active Problem List   Diagnosis Date Noted   Chronic low back pain 08/03/2022   AKI (acute kidney injury) ([place]) 06/22/2022   COPD  GOLD 2 03/02/2022   Hypertension    Chronic kidney disease    Anxiety    Acute exacerbation of CHF (congestive heart failure) ([place]) 01/05/2022   Chest wall pain 11/02/2021   Nausea without vomiting 10/30/2021   Tricuspid regurgitation 07/14/2021   Protein-calorie malnutrition, severe 09/05/2019   Dehydration    Gastroenteritis    Enteritis 09/03/2019   COVID-19 08/27/2019   Dysuria 07/07/2019   Chronic anxiety 12/23/2017   Aortic stenosis, mild 07/26/2017   S/P Maze operation for atrial fibrillation 10/14/2016   S/P CABG x 1 10/14/2016   Cellulitis of left lower extremity 08/17/2016   Coronary artery disease    Chronic insomnia 07/12/2016   DOE (dyspnea on exertion) 03/26/2015   Warfarin-induced coagulopathy ([place]) 03/16/2015   Valvular heart disease 03/16/2015   Nephrolithiasis 03/16/2015   Acute respiratory failure with hypoxia  secondary to acute on chronic COPD and diastolic congestive heart failure exacerbation 03/11/2015   Essential hypertension    RHEUMATIC MITRAL STENOSIS 01/05/2008   Rheumatic heart disease 01/05/2008   Atrial fibrillation ([place]) 01/05/2008    Immunization History  Administered Date(s) Administered   Fluad Quad(high Dose 65+) 07/01/2020, 07/16/2022   Influenza Split 08/22/2011, 08/06/2012, 07/26/2013    Influenza, High Dose Seasonal PF 09/17/2015, 07/12/2016, 08/01/2017   Influenza,inj,Quad PF,6+ Mos 06/21/2019   Influenza,inj,quad, With Preservative 08/28/2018, 06/21/2019   Influenza-Unspecified 09/18/2018, 09/22/2021   Moderna Sars-Covid-2 Vaccination 11/06/2019, 12/04/2019, 08/19/2020   Pneumococcal Conjugate-13 09/17/2015   Pneumococcal Polysaccharide-23 11/22/2011   Tdap 05/04/2012   Zoster Recombinat (Shingrix) 10/13/2017, 12/13/2017   Zoster, Live 08/22/2011   Patient's back is bothering her a lot and her neurosurgeon just retired. She reports she just saw a new one and he didn't give her a lot of advice and he said she needs to change her lifestyle. Her pain is in lower back and in hips and all the way to her feet. She is just using Tylenol and Tramadol to alternate. The new doctor she saw was Dr. Reatha Armour at [place] and wants a second opinion.   Patient reports she is an active person and she reports it was not helpful.   She reports she didn't do too well with Dr. [name] and switched to Dr. Halford Chessman after seeing him once. He moved to the [place] office and this is also more convenient for her. Patient wasn't having side effects with the Bevespi. She reports since stopping the Bevespi and she doesn't  feel like anything has changed with her breathing but it has not been long off of it.  Patient had some swelling over Thanksgiving and had more swelling because of what she ate. She really has cut back on salt and hasn't noticed much of a difference.  Conditions to be addressed/monitored:  Hypertension, Hyperlipidemia, Atrial Fibrillation, Heart Failure, Coronary Artery Disease, COPD, and Anxiety  Conditions addressed this visit: COPD, hypertension, heart failure   Care Plan : CCM Pharmacy Care Plan  Updates made by Viona Gilmore, [place] since 08/25/2022 12:00 AM     Problem: Problem: Hypertension, Hyperlipidemia, Atrial Fibrillation, Heart Failure,  Coronary Artery Disease, COPD, and Anxiety      Long-Range Goal: Patient-Specific Goal   Start Date: 06/17/2021  Expected End Date: 06/17/2022  Recent Progress: On track  Priority: High  Note:   Current Barriers:  Unable to independently monitor therapeutic efficacy  Pharmacist Clinical Goal(s):  Patient will achieve adherence to monitoring guidelines and medication adherence to achieve therapeutic efficacy through collaboration with PharmD and provider.   Interventions: 1:1 collaboration with [name], MD regarding development and update of comprehensive plan of care as evidenced by provider attestation and co-signature Inter-disciplinary care team collaboration (see longitudinal plan of care) Comprehensive medication review performed; medication list updated in electronic medical record  Hypertension (BP goal <140/90) -Controlled -Current treatment: Bumetanide 2 mg 1 tablet twice daily  - Appropriate, Effective, Safe, Accessible Metoprolol XL 25 mg daily - in PM - Appropriate, Query effective, Safe, Accessible Entresto 24-26 mg 1 tablet twice daily - Appropriate, Effective, Safe, Accessible -Medications previously tried: doxazosin (low BP), amlodipine, atenolol, irbesartan, losartan, maxzide, nebivolol, spironolactone  -Current home readings: 126/62, 101/60, 129/58, 104/59, 105/50, 118/52 (stopped doxazosin 8/11 - all readings post stopping doxazosin) -Current dietary habits: tries to limit salt intake -Current exercise habits: limited in her ability -Denies hypotensive/hypertensive symptoms -Educated on BP goals and benefits of medications for prevention of heart attack, stroke and kidney damage; Importance of home blood pressure monitoring; Proper BP monitoring technique; Symptoms of hypotension and importance of maintaining adequate hydration; -Counseled to monitor BP at home at least weekly, document, and provide log at future appointments -Counseled on diet and  exercise extensively Recommended to continue current medication  Hyperlipidemia: (LDL goal < 70) -Not ideally controlled -Current treatment: No medications -Medications previously tried: rosuvastatin (myalgias), Livalo (cost),  Zetia (myalgias), simvastatin (myalgias) -Current dietary patterns: did not discuss -Current exercise habits: minimal -Educated on Cholesterol goals;  Importance of limiting foods high in cholesterol; Exercise goal of 150 minutes per week; -Counseled on diet and exercise extensively Recommended to continue current medication  Atrial Fibrillation (Goal: prevent stroke and major bleeding) -Controlled -CHADSVASC: 6 -Current treatment: Rate control: Metoprolol XL 25 mg daily - Appropriate, Effective, Safe, Accessible Anticoagulation: warfarin 2 mg tablets as directed by coumadin clinic - Appropriate, Effective, Safe, Accessible -Medications previously tried: Amiodarone -Home BP and HR readings: refer to above  -Counseled on increased risk of stroke due to Afib and benefits of anticoagulation for stroke prevention; bleeding risk associated with warfarin and importance of self-monitoring for signs/symptoms of bleeding; avoidance of NSAIDs due to increased bleeding risk with anticoagulants; -Recommended to continue current medication  Heart Failure (Goal: manage symptoms and prevent exacerbations) -Controlled -Last ejection fraction: 60-65% (Date: 10/15/19) -HF type: Diastolic -NYHA Class: II (slight limitation of activity) -AHA HF Stage: B (Heart disease present - no symptoms present) -Current treatment: Bumetanide 2 mg 1 tablet  daily and twice daily on Mon,  Wed and Fri - Appropriate, Effective, Safe, Accessible Metoprolol XL 25 mg daily  - Appropriate, Effective, Safe, Accessible Potassium chloride 10 mEq 8 tablets daily - Appropriate, Effective, Safe, Accessible Entresto 24-36 mg 1 tablet twice daily - Appropriate, Effective, Safe, Accessible Metolazone 2.5  mg 1 dose as needed - Appropriate, Effective, Safe, Accessible -Medications previously tried: none  -Current home BP/HR readings: refer to above -Current dietary habits: limits salt intake; patient is closely monitoring sodium intake and is down to < 2000 mcg per day -Current exercise habits: minimal -Educated on Importance of weighing daily; if you gain more than 3 pounds in one day or 5 pounds in one week, call cardiologist. Proper diuretic administration and potassium supplementation Importance of blood pressure control -Counseled on diet and exercise extensively Recommended to continue current medication  COPD (Goal: control symptoms and prevent exacerbations) -Uncontrolled -Current treatment  Albuterol HFA 108 mcg/act as needed - Appropriate, Effective, Safe, Accessible -Medications previously tried: Symbicort (switched to [name]), Bevespi (stopped) -Gold Grade: Gold 2 (FEV1 50-79%) -Current COPD Classification:  C (low sx, >/=2 exacerbations/yr) -MMRC/CAT score: n/a -Pulmonary function testing: 2019 -Exacerbations requiring treatment in last 6 months: yes -Patient denies consistent use of maintenance inhaler -Frequency of rescue inhaler use: sometimes once a week -Counseled on Proper inhaler technique; Benefits of consistent maintenance inhaler use When to use rescue inhaler Differences between maintenance and rescue inhalers -Recommended to continue current medication Recommended repeat spirometry and restarting LAMA/LABA.  Anxiety (Goal: minimize symptoms) -Not ideally controlled -Current treatment: Diazepam 5 mg at bedtime - Appropriate, Effective, Query Safe, Accessible -Medications previously tried/failed: n/a -PHQ9: 0 -GAD7: n/a -Educated on Benefits of medication for symptom control Benefits of cognitive-behavioral therapy with or without medication -Recommended to continue current medication   Health Maintenance -Vaccine gaps: COVID booster, RSV -Current  therapy:  Fluconazole 150 mg  Multivitamin daily (Vitafusion Multivite Gummies)  Hair skin and nails multivitamin daily Vitamin D 5000 units daily Ondansetron ODT 4 mg q8hr PRN  Magni-Life Topical Cream  Potassium 10 mEq 6 tablets daily (8 tablets twice daily)  -Educated on Cost vs benefit of each product must be carefully weighed by individual consumer -Patient is satisfied with current therapy and denies issues -Recommended to continue current medication  Patient Goals/Self-Care Activities Patient will:  - take medications as prescribed check blood pressure weekly, document, and provide at future appointments target a minimum of 150 minutes of moderate intensity exercise weekly  Follow Up Plan: The care management team will reach out to the patient again over the next 7 days.        Medication Assistance:  Charolotte Eke and Entresto obtained through [employer] and [employer] medication assistance program.  Enrollment ends 09/26/22  Compliance/Adherence/Medication fill history: Care Gaps: COVID booster (does not want to get the vaccine again), tetanus Last BP - 112/60 on 08/03/2022  Star-Rating Drugs: None  Patient's preferred pharmacy is:  [facility], [address] [place] [address] Phone: [phone] Fax: [place] [phone] [address] [place] [address] Phone: [phone] Fax: [place], [place] [place] [address] Phone: [phone] Fax: [phone]  [facility] Dorene Grebe, [place] [address] [place] [address] Phone: [phone] Fax: [phone]  [place]  [place] [address] Phone: [phone] Fax: [phone]   Uses pill box? Yes Pt endorses 99% compliance  We discussed: Current  pharmacy is preferred with insurance plan and patient is satisfied with pharmacy services Patient decided to: Continue current medication management strategy  Care Plan and Follow Up Patient Decision:  Patient agrees to Care Plan and Follow-up.  Plan: The care management team will reach out to the patient again over the next 7 days.  [name], PharmD, [place] Pharmacist [facility]

## 2022-08-26 DIAGNOSIS — J441 Chronic obstructive pulmonary disease with (acute) exacerbation: Secondary | ICD-10-CM

## 2022-08-26 DIAGNOSIS — I10 Essential (primary) hypertension: Secondary | ICD-10-CM

## 2022-08-30 ENCOUNTER — Encounter (HOSPITAL_BASED_OUTPATIENT_CLINIC_OR_DEPARTMENT_OTHER): Payer: Self-pay | Admitting: Pulmonary Disease

## 2022-08-30 ENCOUNTER — Ambulatory Visit (INDEPENDENT_AMBULATORY_CARE_PROVIDER_SITE_OTHER): Payer: Medicare Other

## 2022-08-30 ENCOUNTER — Ambulatory Visit (INDEPENDENT_AMBULATORY_CARE_PROVIDER_SITE_OTHER): Payer: Medicare Other | Admitting: Pulmonary Disease

## 2022-08-30 VITALS — BP 114/68 | HR 54 | Temp 98.3°F | Ht 63.0 in | Wt 138.4 lb

## 2022-08-30 DIAGNOSIS — J4489 Other specified chronic obstructive pulmonary disease: Secondary | ICD-10-CM

## 2022-08-30 DIAGNOSIS — Z7901 Long term (current) use of anticoagulants: Secondary | ICD-10-CM | POA: Diagnosis not present

## 2022-08-30 LAB — POCT INR: INR: 1.8 — AB (ref 2.0–3.0)

## 2022-08-30 MED ORDER — FLUTICASONE PROPIONATE 50 MCG/ACT NA SUSP: 1.0000 | Freq: Every day | NASAL | 2 refills | Status: DC | PRN

## 2022-08-30 NOTE — Progress Notes (Signed)
[facility]  Chief Complaint  Patient presents with   Follow-up    Pt states she has had to use her Albuterol Inhaler X 3 this past weekend with exertion.     Past Surgical History:  She  has a past surgical history that includes Cardiac catheterization; cataract surg; Abdominal hysterectomy (1983); Eye surgery; Cholecystectomy (N/A, 05/30/2015); TEE without cardioversion (N/A, 07/05/2016); Cardiac catheterization (N/A, 08/03/2016); Mitral valve surgery (Left, 1970); Back surgery; Appendectomy; Colonoscopy; Mitral valve replacement (N/A, 10/14/2016); Coronary artery bypass graft (N/A, 10/14/2016); MAZE (N/A, 10/14/2016); and TEE without cardioversion (N/A, 10/14/2016).  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 female former smoker with COPD and asthma.      Subjective:   She is here with her husband.  She developed dryness in her nose over the weekend.  Yesterday her nose started running.  Her throat is scratchy and she has a dry cough.  Had to use albuterol.  Not having chest congestion, wheeze, or shortness.  Had more back pain and this is limiting her activity level.  Physical Exam:   Appearance - well kempt   ENMT - no sinus tenderness, no oral exudate, no LAN, Mallampati 3 airway, no stridor  Respiratory - equal breath sounds bilaterally, no wheezing or rales  CV - s1s2 regular rate and rhythm, no murmurs  Ext - no clubbing, no edema  Skin - no rashes  Psych - normal mood and affect    Pulmonary testing:  PFT 10/12/16  >> FEV1 1.34 (71%), FEV1% 70, TLC 5.00 (101%), DLCO 54%  Chest Imaging:  CT angio chest 10/29/21 >> ATX RML, small effusions  Cardiac Tests:  Echo 10/30/21 >> EF 60 to 65%, mod elevation in PASP, mod LA/RA dilation, bioprosthetic MV, mod TR, mild AS  Social History:  She  reports that she quit smoking about 46 years ago. Her smoking use included cigarettes. She has a 15.00 pack-year smoking history. She has never used smokeless tobacco. She reports that she does not drink alcohol and does not use drugs.  Family History:  Her family history includes Leukemia in her father.     Assessment/Plan:   COPD with asthma. - bevespi caused leg cramps and urine retention - she has minimal symptoms at present - prn albuterol  Allergic rhinitis. - likely cause of her current symptoms - prn flonase, Ayr  Chronic atrial fibrillation, Rheumatic valvular heart disease s/p MVR, CAD s/p CABG. - followed by Dr. [name] with cardiology  Time Spent Involved in Patient Care on Day of Examination:  26 minutes  Follow up:   Patient Instructions  Use Ayr and Flonase at night to help with sinus congestion and dryness  Follow up in 6 months  Medication List:   Allergies as of 08/30/2022       Reactions   Aldactone [spironolactone] Other (See Comments)   dyspnea   Amoxicillin Palpitations   Tachycardia Has patient had a PCN reaction causing immediate rash, facial/tongue/throat swelling,  SOB or lightheadedness with hypotension: no Has patient had a PCN reaction causing severe rash involving mucus membranes or skin necrosis: {no Has patient had a PCN reaction that required hospitalization {no Has patient had a PCN reaction occurring within the last 10 years: {yes If all of the above answers are "NO", then may proceed with Cephalosporin use.   Diltiazem Other (See Comments)   Causing headaches   Flagyl [metronidazole Hcl] Other (See Comments)   Causing headaches   Flovent [fluticasone  Propionate] Other (See Comments)   Leg cramps   Gabapentin Swelling   Lyrica [pregabalin] Swelling   Quinidine Diarrhea, Other (See Comments)   Fever diarrhea   Simvastatin Other (See Comments)   Leg pain, myalgia   Tramadol Nausea Only   Verapamil Other (See Comments)   myalgias   Amlodipine    Low extremity edema   Crestor [rosuvastatin]    myalgia   Livalo [pitavastatin]    myalgias   Zetia [ezetimibe]    LEG CRAMPS   Benazepril Hcl Cough   Ciprofloxacin Diarrhea   Codeine Nausea Only   Nitrofurantoin Monohyd Macro Nausea Only        Medication List        Accurate as of August 30, 2022 11:32 AM. If you have any questions, ask your nurse or doctor.          acetaminophen 500 MG tablet Commonly known as: TYLENOL Take 1,000 mg by mouth every 6 (six) hours as needed for mild pain.   albuterol 108 (90 Base) MCG/ACT inhaler Commonly known as: VENTOLIN HFA INHALE 2 PUFFS INTO THE LUNGS EVERY 4 HOURS AS NEEDED FOR AHEEZING OR SHORTNESS OF BREATH   albuterol (2.5 MG/3ML) 0.083% nebulizer solution Commonly known as: PROVENTIL Take 3 mLs (2.5 mg total) by nebulization every 4 (four) hours as needed for wheezing or shortness of breath.   bumetanide 2 MG tablet Commonly known as: Bumex Please take '2mg'$  daily in the morning and then '2mg'$  in the afternoon on MONDAY, [facility] AND FRIDAY   cholecalciferol 25 MCG (1000 UNIT) tablet Commonly known as: VITAMIN D3 Take 1,000 Units by mouth daily.   diazepam 5 MG tablet Commonly known as: VALIUM TAKE ONE TABLET AT BEDTIME   Entresto 24-26 MG Generic drug: sacubitril-valsartan Take 1 tablet by mouth 2 (two) times daily.   estradiol 0.1 MG/GM vaginal cream Commonly known as: ESTRACE VAGINAL USE 2 grams per vagina daily for 2 weeks and then 1 gram per vagina three times per week.   fluticasone 50 MCG/ACT nasal spray Commonly known as: FLONASE Place 1 spray into both nostrils daily as needed for allergies or  rhinitis. Started by: Chesley Mires, MD   HAIR/SKIN/NAILS/BIOTIN PO Take 1 tablet by mouth daily.   Multivitamin Adult Extra C Chew Chew 2 tablets by mouth daily.   metolazone 5 MG tablet Commonly known as: ZAROXOLYN Take 5 mg by mouth daily. TAKE ONE TABLET ONCE A WEEK 30 MINUTES BEFORE BUMEX DOSE AS NEEDED ONLY   phenazopyridine 200 MG tablet Commonly known as: Pyridium Take 1 tablet (200 mg total) by mouth 3 (three) times daily as needed for pain.   potassium chloride 10 MEQ tablet Commonly known as: KLOR-CON TAKE UP TO 8 TABLETS DAILY AS DIRECTED   traMADol 50 MG tablet Commonly known as: ULTRAM TAKE ONE TABLET BY MOUTH EVERY 6 HOURS AS NEEDED FOR UP TO FIVE DAYS   warfarin 2 MG tablet Commonly known as: COUMADIN Take as directed by the anticoagulation  clinic. If you are unsure how to take this medication, talk to your nurse or doctor. Original instructions: Take 1 tablet daily or take as directed by anticoagulation clinic        Signature:  Chesley Mires, MD [place] Pager - [phone] 08/30/2022, 11:32 AM

## 2022-08-30 NOTE — Patient Instructions (Addendum)
Pre visit review using our clinic review tool, if applicable. No additional management support is needed unless otherwise documented below in the visit note.  Increase dose today to take 1 1/2 tablets and then continue to take 1/2 tablet every day except take 1 tablet every day on Mondays, Thursdays, and Saturdays. Recheck in 4 weeks.

## 2022-08-30 NOTE — Progress Notes (Unsigned)
Pt reports reduction in appetite.   Increase dose today to take 1 1/2 tablets and then continue to take 1/2 tablet every day except take 1 tablet every day on Mondays, Thursdays, and Saturdays. Recheck in 4 weeks.

## 2022-08-30 NOTE — Patient Instructions (Signed)
Use Ayr and Flonase at night to help with sinus congestion and dryness  Follow up in 6 months

## 2022-09-01 NOTE — Addendum Note (Signed)
Addended by: [name] A on: 09/01/2022 08:11 AM   Modules accepted: Level of Service

## 2022-09-02 ENCOUNTER — Telehealth (HOSPITAL_BASED_OUTPATIENT_CLINIC_OR_DEPARTMENT_OTHER): Payer: Self-pay | Admitting: *Deleted

## 2022-09-02 ENCOUNTER — Telehealth (HOSPITAL_BASED_OUTPATIENT_CLINIC_OR_DEPARTMENT_OTHER): Payer: Self-pay | Admitting: Pulmonary Disease

## 2022-09-02 ENCOUNTER — Other Ambulatory Visit (HOSPITAL_BASED_OUTPATIENT_CLINIC_OR_DEPARTMENT_OTHER): Payer: Self-pay

## 2022-09-02 DIAGNOSIS — B078 Other viral warts: Secondary | ICD-10-CM | POA: Diagnosis not present

## 2022-09-02 DIAGNOSIS — R238 Other skin changes: Secondary | ICD-10-CM | POA: Diagnosis not present

## 2022-09-02 DIAGNOSIS — L538 Other specified erythematous conditions: Secondary | ICD-10-CM | POA: Diagnosis not present

## 2022-09-02 DIAGNOSIS — L570 Actinic keratosis: Secondary | ICD-10-CM | POA: Diagnosis not present

## 2022-09-02 DIAGNOSIS — L298 Other pruritus: Secondary | ICD-10-CM | POA: Diagnosis not present

## 2022-09-02 DIAGNOSIS — L820 Inflamed seborrheic keratosis: Secondary | ICD-10-CM | POA: Diagnosis not present

## 2022-09-02 DIAGNOSIS — Z789 Other specified health status: Secondary | ICD-10-CM | POA: Diagnosis not present

## 2022-09-02 MED ORDER — ALBUTEROL SULFATE HFA 108 (90 BASE) MCG/ACT IN AERS: INHALATION_SPRAY | RESPIRATORY_TRACT | 2 refills | Status: DC

## 2022-09-02 MED ORDER — AMOXICILLIN 400 MG/5ML PO SUSR: 800.0000 mg | Freq: Two times a day (BID) | ORAL | 0 refills | Status: DC

## 2022-09-02 NOTE — Telephone Encounter (Signed)
Patient in office with husband today Dr Oval Linsey looked arm for patient and Rx'd Amoxil sus 800 mg twice a day for 5 days  Rx sent to pharmacy as requested

## 2022-09-02 NOTE — Telephone Encounter (Signed)
Called and spoke to patient and advised her that I was sending in her albuterol inhaler to [facility] as requested. Nothing further needed

## 2022-09-06 ENCOUNTER — Ambulatory Visit: Payer: Medicare Other | Admitting: Internal Medicine

## 2022-09-14 ENCOUNTER — Ambulatory Visit (HOSPITAL_BASED_OUTPATIENT_CLINIC_OR_DEPARTMENT_OTHER): Payer: Medicare Other | Admitting: Family

## 2022-09-14 ENCOUNTER — Ambulatory Visit (INDEPENDENT_AMBULATORY_CARE_PROVIDER_SITE_OTHER): Payer: Medicare Other | Admitting: Family Medicine

## 2022-09-14 VITALS — BP 140/70 | HR 65 | Temp 98.5°F | Ht 63.0 in | Wt 134.7 lb

## 2022-09-14 DIAGNOSIS — G8929 Other chronic pain: Secondary | ICD-10-CM

## 2022-09-14 DIAGNOSIS — M545 Low back pain, unspecified: Secondary | ICD-10-CM | POA: Diagnosis not present

## 2022-09-14 DIAGNOSIS — R059 Cough, unspecified: Secondary | ICD-10-CM

## 2022-09-14 DIAGNOSIS — R300 Dysuria: Secondary | ICD-10-CM | POA: Diagnosis not present

## 2022-09-14 LAB — POCT INFLUENZA A/B
Influenza A, POC: NEGATIVE
Influenza B, POC: NEGATIVE

## 2022-09-14 LAB — POC COVID19 BINAXNOW: SARS Coronavirus 2 Ag: NEGATIVE

## 2022-09-14 MED ORDER — HYDROCODONE-ACETAMINOPHEN 5-325 MG PO TABS: 1.0000 | ORAL_TABLET | Freq: Three times a day (TID) | ORAL | 0 refills | Status: DC

## 2022-09-14 NOTE — Patient Instructions (Signed)
Drink plenty of water/fluids  Consider daily stool softener to reduce constipated  Could use Miralax and/or Senokot S as needed for constipation.

## 2022-09-14 NOTE — Progress Notes (Unsigned)
Established Patient Office Visit  Subjective   Patient ID: Catherine [name], female    DOB: May 17, 1935  Age: 87 y.o. MRN: [id number]  Chief Complaint  Patient presents with   Urinary Tract Infection    X1 week   Sore Throat    Patient complains of sore throat, x6 days    Back Pain    HPI  {History (Optional):23778} Catherine [name] is here today accompanied by husband with several issues  She has longstanding history of recurrent UTIs and frequent dysuria.  She has had multiple negative cultures as well over time.  We recently had written prescription for estrogen trial for atrophic vaginitis.  She only took this for a month and then stopped this on her own.  Current symptoms over the past week include some intermittent burning with urination but no fever.  No chills.  No flank pain.  She has had some upper respiratory symptoms with sore throat and nasal congestion past few days.  Again no fever.  Her major complaint though is severe back pain.  She has history of low back surgery x 2.  She recently went back to her neurosurgical practice and was told there is nothing else they could offer.  She has had multiple injections in the past without improvement.  She has tried Tylenol without relief.  She relates her pain 8 out of 10 and severely compromising her quality of life.  Has difficulty sleeping and basically has become almost bedridden because of her pain.  She has pain that radiates into both buttocks with ambulation.  She recently had a few hydrocodone but has been using sparingly and only taking a half of 5 mg without much improvement.  She was told recently by neurosurgeon that surgeries were no longer an option  Past Medical History:  Diagnosis Date   Acute on chronic diastolic heart failure ([facility])    AKI (acute kidney injury) ([facility]) 06/22/2022   Aortic stenosis, mild 07/26/2017   Arthritis    Asthma    last attack 02/2015   Atrial fibrillation, chronic (HCC)    Cellulitis of left  lower extremity 08/17/2016   Ulcer associated with severe venous insufficiency   Chronic anticoagulation    Chronic diastolic CHF (congestive heart failure) ([place] [facility])    Chronic kidney disease    "RIGHT MANY KIDNEY INFECTIONS AND STONES"   COPD (chronic obstructive pulmonary disease) ([facility])    Coronary artery disease    Dizziness    H/O: rheumatic fever    Heart murmur    Hypertension    PONV (postoperative nausea and vomiting)    ' SOMETIMES', BUT NOT ALWAYS"   S/P Maze operation for atrial fibrillation 10/14/2016   Complete bilateral atrial lesion set using cryothermy and bipolar radiofrequency ablation - atrial appendage was not treated due to previous surgical procedure (open mitral commissurotomy)   S/P mitral valve replacement with bioprosthetic valve 10/14/2016   29 mm Medtronic Mosaic porcine bioprosthetic tissue valve   Tricuspid regurgitation 07/14/2021   UTI (urinary tract infection)    Valvular heart disease    Has mitral stenosis with prior mitral commissurotomy in 1970   Past Surgical History:  Procedure Laterality Date   ABDOMINAL HYSTERECTOMY  1983   endometriosis   APPENDECTOMY     BACK SURGERY     neurosurgery x2   CARDIAC CATHETERIZATION     CARDIAC CATHETERIZATION N/A 08/03/2016   Procedure: Right/Left Heart Cath and Coronary Angiography;  Surgeon: Peter M Martinique, MD;  Location:  [facility];  Service: Cardiovascular;  Laterality: N/A;   cataract surg     CHOLECYSTECTOMY N/A 05/30/2015   Procedure: LAPAROSCOPIC CHOLECYSTECTOMY WITH INTRAOPERATIVE CHOLANGIOGRAM;  Surgeon: Excell Seltzer, MD;  Location: [facility];  Service: General;  Laterality: N/A;   COLONOSCOPY     CORONARY ARTERY BYPASS GRAFT N/A 10/14/2016   Procedure: CORONARY ARTERY BYPASS GRAFTING (CABG);  Surgeon: Rexene Alberts, MD;  Location: [facility];  Service: Open Heart Surgery;  Laterality: N/A;   EYE SURGERY     MAZE N/A 10/14/2016   Procedure: MAZE;  Surgeon: Rexene Alberts, MD;  Location: [facility];   Service: Open Heart Surgery;  Laterality: N/A;   MITRAL VALVE REPLACEMENT N/A 10/14/2016   Procedure: REDO MITRAL VALVE REPLACEMENT (MVR);  Surgeon: Rexene Alberts, MD;  Location: [facility];  Service: Open Heart Surgery;  Laterality: N/A;   MITRAL VALVE SURGERY Left 1970   Open mitral commissurotomy via left thoracotomy approach   TEE WITHOUT CARDIOVERSION N/A 07/05/2016   Procedure: TRANSESOPHAGEAL ECHOCARDIOGRAM (TEE);  Surgeon: [name], MD;  Location: [facility];  Service: Cardiovascular;  Laterality: N/A;   TEE WITHOUT CARDIOVERSION N/A 10/14/2016   Procedure: TRANSESOPHAGEAL ECHOCARDIOGRAM (TEE);  Surgeon: Rexene Alberts, MD;  Location: [facility];  Service: Open Heart Surgery;  Laterality: N/A;    reports that she quit smoking about 46 years ago. Her smoking use included cigarettes. She has a 15.00 pack-year smoking history. She has never used smokeless tobacco. She reports that she does not drink alcohol and does not use drugs. family history includes Leukemia in her father. Allergies  Allergen Reactions   Aldactone [Spironolactone] Other (See Comments)    dyspnea   Amoxicillin Palpitations    Tachycardia Has patient had a PCN reaction causing immediate rash, facial/tongue/throat swelling, SOB or lightheadedness with hypotension: no Has patient had a PCN reaction causing severe rash involving mucus membranes or skin necrosis: {no Has patient had a PCN reaction that required hospitalization {no Has patient had a PCN reaction occurring within the last 10 years: {yes If all of the above answers are "NO", then may proceed with Cephalosporin use.   Diltiazem Other (See Comments)    Causing headaches    Flagyl [Metronidazole Hcl] Other (See Comments)    Causing headaches     Flovent [Fluticasone Propionate] Other (See Comments)    Leg cramps   Gabapentin Swelling   Lyrica [Pregabalin] Swelling   Quinidine Diarrhea and Other (See Comments)    Fever diarrhea   Simvastatin Other  (See Comments)    Leg pain, myalgia   Tramadol Nausea Only   Verapamil Other (See Comments)    myalgias   Amlodipine     Low extremity edema   Crestor [Rosuvastatin]     myalgia   Livalo [Pitavastatin]     myalgias   Zetia [Ezetimibe]     LEG CRAMPS    Benazepril Hcl Cough   Ciprofloxacin Diarrhea   Codeine Nausea Only   Nitrofurantoin Monohyd Macro Nausea Only    Review of Systems  Constitutional:  Positive for malaise/fatigue. Negative for chills and fever.  HENT:  Positive for congestion.   Respiratory:  Positive for cough.   Cardiovascular:  Negative for chest pain.  Genitourinary:  Positive for dysuria and frequency. Negative for hematuria.  Musculoskeletal:  Positive for back pain.      Objective:     BP (!) 140/70 (BP Location: Left Arm, Patient Position: Sitting, Cuff Size: Normal)   Pulse  65   Temp 98.5 F (36.9 C) (Oral)   Ht '5\' 3"'$  (1.6 m)   Wt 134 lb 11.2 oz (61.1 kg)   SpO2 95%   BMI 23.86 kg/m  BP Readings from Last 3 Encounters:  09/14/22 (!) 140/70  08/30/22 114/68  08/03/22 112/60   Wt Readings from Last 3 Encounters:  09/14/22 134 lb 11.2 oz (61.1 kg)  08/30/22 138 lb 6.4 oz (62.8 kg)  08/03/22 134 lb 14.4 oz (61.2 kg)      Physical Exam Vitals reviewed.  Constitutional:      General: She is not in acute distress. Cardiovascular:     Rate and Rhythm: Normal rate.  Pulmonary:     Effort: Pulmonary effort is normal.     Breath sounds: No wheezing or rales.  Neurological:     Mental Status: She is alert.      Results for orders placed or performed in visit on 09/14/22  POC COVID-19  Result Value Ref Range   SARS Coronavirus 2 Ag Negative Negative  POC Influenza A/B  Result Value Ref Range   Influenza A, POC Negative Negative   Influenza B, POC Negative Negative    {Labs (Optional):23779}  The ASCVD Risk score (Arnett DK, et al., 2019) failed to calculate for the following reasons:   The 2019 ASCVD risk score is only valid  for ages 21 to 24    Assessment & Plan:   #1 dysuria.  Urine dipstick showed only trace leukocytes.  Urine culture sent.  Follow-up immediately for any fever.  Stay well-hydrated  #2 URI symptoms.  Nonfocal exam.  Afebrile.  Suspect viral.  Follow-up immediately for any fever or worsening symptoms  #3 chronic low back pain.  She is describing 8 out of 10 pain daily and recently saw her neurosurgeon with no further treatments recommended.  She states her quality life is extremely poor.  We did discuss goals of getting her relief from her chronic pain.  She has not gotten relief with plain Tylenol. -We had long discussion regarding chronic pain management. -We discussed risk of chronic opioid management including risk of constipation, fatigue, etc. -After much discussion we decided to write for hydrocodone 5/325 mg to take 1 every 8 hours.  We suggested measures to reduce constipation risks. -We discussed [facility] Board recommendations for chronic opioid use.  Controlled substance contract reviewed by nurse and signed.  She is aware that she will need to get drug test at least once per year. -We have recommended follow-up in 1 month to reassess her pain control.   Return in about 1 month (around 10/15/2022).    [name], MD

## 2022-09-15 LAB — POC URINALSYSI DIPSTICK (AUTOMATED)
Bilirubin, UA: NEGATIVE
Blood, UA: NEGATIVE
Glucose, UA: NEGATIVE
Ketones, UA: NEGATIVE
Nitrite, UA: NEGATIVE
Protein, UA: NEGATIVE
Spec Grav, UA: 1.02 (ref 1.010–1.025)
Urobilinogen, UA: 0.2 E.U./dL — AB
pH, UA: 6 (ref 5.0–8.0)

## 2022-09-17 LAB — URINE CULTURE
MICRO NUMBER:: 14334651
SPECIMEN QUALITY:: ADEQUATE

## 2022-09-17 MED ORDER — CEPHALEXIN 500 MG PO CAPS: 500.0000 mg | ORAL_CAPSULE | Freq: Three times a day (TID) | ORAL | 0 refills | Status: AC

## 2022-09-17 NOTE — Addendum Note (Signed)
Addended by: Gwenyth Ober R on: 09/17/2022 12:43 PM   Modules accepted: Orders

## 2022-09-18 ENCOUNTER — Other Ambulatory Visit: Payer: Self-pay | Admitting: Cardiovascular Disease

## 2022-09-21 ENCOUNTER — Telehealth (HOSPITAL_BASED_OUTPATIENT_CLINIC_OR_DEPARTMENT_OTHER): Payer: Self-pay | Admitting: Cardiovascular Disease

## 2022-09-21 ENCOUNTER — Other Ambulatory Visit: Payer: Self-pay | Admitting: Cardiovascular Disease

## 2022-09-21 MED ORDER — METOPROLOL SUCCINATE ER 25 MG PO TB24: 37.5000 mg | ORAL_TABLET | Freq: Every day | ORAL | 1 refills | Status: DC

## 2022-09-21 NOTE — Telephone Encounter (Signed)
Rx(s) sent to pharmacy electronically.  

## 2022-09-21 NOTE — Telephone Encounter (Signed)
Pt c/o medication issue:  1. Name of Medication:  metoprolol succinate (TOPROL-XL) 24 hr tablet 25 mg   2. How are you currently taking this medication (dosage and times per day)? 1 and a 1/2 tablets in the morning   3. Are you having a reaction (difficulty breathing--STAT)? No   4. What is your medication issue? Patient requested a refill and the pharmacy advised her that she is not supposed to be taking this medication anymore. Please advise.

## 2022-09-29 ENCOUNTER — Ambulatory Visit (INDEPENDENT_AMBULATORY_CARE_PROVIDER_SITE_OTHER): Payer: Medicare Other

## 2022-09-29 DIAGNOSIS — Z789 Other specified health status: Secondary | ICD-10-CM | POA: Diagnosis not present

## 2022-09-29 DIAGNOSIS — C44311 Basal cell carcinoma of skin of nose: Secondary | ICD-10-CM | POA: Diagnosis not present

## 2022-09-29 DIAGNOSIS — C44329 Squamous cell carcinoma of skin of other parts of face: Secondary | ICD-10-CM | POA: Diagnosis not present

## 2022-09-29 DIAGNOSIS — Z7901 Long term (current) use of anticoagulants: Secondary | ICD-10-CM | POA: Diagnosis not present

## 2022-09-29 DIAGNOSIS — L820 Inflamed seborrheic keratosis: Secondary | ICD-10-CM | POA: Diagnosis not present

## 2022-09-29 DIAGNOSIS — D485 Neoplasm of uncertain behavior of skin: Secondary | ICD-10-CM | POA: Diagnosis not present

## 2022-09-29 DIAGNOSIS — L570 Actinic keratosis: Secondary | ICD-10-CM | POA: Diagnosis not present

## 2022-09-29 DIAGNOSIS — B078 Other viral warts: Secondary | ICD-10-CM | POA: Diagnosis not present

## 2022-09-29 DIAGNOSIS — R208 Other disturbances of skin sensation: Secondary | ICD-10-CM | POA: Diagnosis not present

## 2022-09-29 DIAGNOSIS — L298 Other pruritus: Secondary | ICD-10-CM | POA: Diagnosis not present

## 2022-09-29 DIAGNOSIS — L538 Other specified erythematous conditions: Secondary | ICD-10-CM | POA: Diagnosis not present

## 2022-09-29 LAB — POCT INR: INR: 1.7 — AB (ref 2.0–3.0)

## 2022-09-29 NOTE — Progress Notes (Signed)
Increase dose today to take 1 tablets and then change weekly dose to take 1 tablet daily except take 1/2 tablet on Mondays, Wednesdays and Fridays. Recheck in 3 weeks.

## 2022-09-29 NOTE — Patient Instructions (Addendum)
Pre visit review using our clinic review tool, if applicable. No additional management support is needed unless otherwise documented below in the visit note.  Increase dose today to take 1 tablets and then change weekly dose to take 1 tablet daily except take 1/2 tablet on Mondays, Wednesdays and Fridays. Recheck in 3 weeks.

## 2022-10-10 NOTE — Progress Notes (Unsigned)
Office Visit    Patient Name: Catherine [name] Date of Encounter: 10/11/2022  PCP:  [name], MD   [place]  Cardiologist:  [name], MD  Advanced Practice Provider:  No care team member to display Electrophysiologist:  None   Chief Complaint    Catherine [name] is a 88 y.o. female  presents today for heart failure follow up.   Past Medical History    Past Medical History:  Diagnosis Date   Acute on chronic diastolic heart failure ([place])    AKI (acute kidney injury) ([place]) 06/22/2022   Aortic stenosis, mild 07/26/2017   Arthritis    Asthma    last attack 02/2015   Atrial fibrillation, chronic (HCC)    Cellulitis of left lower extremity 08/17/2016   Ulcer associated with severe venous insufficiency   Chronic anticoagulation    Chronic diastolic CHF (congestive heart failure) ([name])    Chronic kidney disease    "RIGHT MANY KIDNEY INFECTIONS AND STONES"   COPD (chronic obstructive pulmonary disease) (HCC)    Coronary artery disease    Dizziness    H/O: rheumatic fever    Heart murmur    Hypertension    PONV (postoperative nausea and vomiting)    ' SOMETIMES', BUT NOT ALWAYS"   S/P Maze operation for atrial fibrillation 10/14/2016   Complete bilateral atrial lesion set using cryothermy and bipolar radiofrequency ablation - atrial appendage was not treated due to previous surgical procedure (open mitral commissurotomy)   S/P mitral valve replacement with bioprosthetic valve 10/14/2016   29 mm Medtronic Mosaic porcine bioprosthetic tissue valve   Tricuspid regurgitation 07/14/2021   UTI (urinary tract infection)    Valvular heart disease    Has mitral stenosis with prior mitral commissurotomy in 1970   Past Surgical History:  Procedure Laterality Date   ABDOMINAL HYSTERECTOMY  1983   endometriosis   APPENDECTOMY     BACK SURGERY     neurosurgery x2   CARDIAC CATHETERIZATION     CARDIAC CATHETERIZATION N/A 08/03/2016   Procedure:  Right/Left Heart Cath and Coronary Angiography;  Surgeon: Peter M Martinique, MD;  Location: [facility];  Service: Cardiovascular;  Laterality: N/A;   cataract surg     CHOLECYSTECTOMY N/A 05/30/2015   Procedure: LAPAROSCOPIC CHOLECYSTECTOMY WITH INTRAOPERATIVE CHOLANGIOGRAM;  Surgeon: Excell Seltzer, MD;  Location: [facility];  Service: General;  Laterality: N/A;   COLONOSCOPY     CORONARY ARTERY BYPASS GRAFT N/A 10/14/2016   Procedure: CORONARY ARTERY BYPASS GRAFTING (CABG);  Surgeon: Rexene Alberts, MD;  Location: [facility];  Service: Open Heart Surgery;  Laterality: N/A;   EYE SURGERY     MAZE N/A 10/14/2016   Procedure: MAZE;  Surgeon: Rexene Alberts, MD;  Location: [facility];  Service: Open Heart Surgery;  Laterality: N/A;   MITRAL VALVE REPLACEMENT N/A 10/14/2016   Procedure: REDO MITRAL VALVE REPLACEMENT (MVR);  Surgeon: Rexene Alberts, MD;  Location: [facility];  Service: Open Heart Surgery;  Laterality: N/A;   MITRAL VALVE SURGERY Left 1970   Open mitral commissurotomy via left thoracotomy approach   TEE WITHOUT CARDIOVERSION N/A 07/05/2016   Procedure: TRANSESOPHAGEAL ECHOCARDIOGRAM (TEE);  Surgeon: [name], MD;  Location: [facility];  Service: Cardiovascular;  Laterality: N/A;   TEE WITHOUT CARDIOVERSION N/A 10/14/2016   Procedure: TRANSESOPHAGEAL ECHOCARDIOGRAM (TEE);  Surgeon: Rexene Alberts, MD;  Location: [facility];  Service: Open Heart Surgery;  Laterality: N/A;   Allergies  Allergies  Allergen Reactions   Aldactone [Spironolactone] Other (See Comments)    dyspnea   Amoxicillin Palpitations    Tachycardia Has patient had a PCN reaction causing immediate rash, facial/tongue/throat swelling, SOB or lightheadedness with hypotension: no Has patient had a PCN reaction causing severe rash involving mucus membranes or skin necrosis: {no Has patient had a PCN reaction that required hospitalization {no Has patient had a PCN reaction occurring within the last 10 years: {yes If all of  the above answers are "NO", then may proceed with Cephalosporin use.   Diltiazem Other (See Comments)    Causing headaches    Flagyl [Metronidazole Hcl] Other (See Comments)    Causing headaches     Flovent [Fluticasone Propionate] Other (See Comments)    Leg cramps   Gabapentin Swelling   Lyrica [Pregabalin] Swelling   Quinidine Diarrhea and Other (See Comments)    Fever diarrhea   Simvastatin Other (See Comments)    Leg pain, myalgia   Tramadol Nausea Only   Verapamil Other (See Comments)    myalgias   Amlodipine     Low extremity edema   Crestor [Rosuvastatin]     myalgia   Livalo [Pitavastatin]     myalgias   Zetia [Ezetimibe]     LEG CRAMPS    Benazepril Hcl Cough   Ciprofloxacin Diarrhea   Codeine Nausea Only   Nitrofurantoin Monohyd Macro Nausea Only    History of Present Illness    Catherine [name] is a 88 y.o. 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  History of rheumatic MV disease and underwent mitral commissurotomy in the 1970s. 09/2017 she underwent bioprosthetic MVR, CABGx1, and bilateral MAZE procedure with Dr. Roxy Manns.  Echo 09/2019 LVEF 65-70%, mild LVH, no RWMA, bioprosthetic mitral valve functioning appropriately, mild AS, mild pulmonic regurgitation.   She was seen 11/2020 by Dr. Oval Linsey with improvement in abdominal bloating after transition from Lasix to Torsemide. Her persistent LE edema was thought to be due to venous insufficiency. Unable to tolerate compression socks and declined referral to [facility].  Hospitalized 10/2021 with COPD exacerbation and HFpEF.  Echo 10/2071 LVEF 60 to 65%, RV mildly reduced, RVSP 58 mmHg, moderate BAE, normal-appearing bioprosthetic MVR, moderate TR.  She was admitted 01/05/22 for acute on chronic diastolic heart  failure with acute hypoxic respiratory failure and COPD exacerbation.   She developed bradycardia with heart rate in the 40s and pauses up to 2.4 seconds while on metoprolol XL 50 mg daily.  It was reduced to 37.5 mg daily.  Amiodarone was considered to reduce risk of intermittent RVR versus pacemaker for tachybradycardia syndrome.  She was trialed on amiodarone but did not tolerate.  At clinic 03/04/22 her Losartan and Torsemide were stopped. She was started on Entresto and Bumex. She was referred to prep exercise program at the [facility] but declined to participate. At follow up 03/2022 Metolazone added once per week for volume control.   Last seen 06/2022 by Dr. Oval Linsey. Volume status had been labile at home. Bumex adjusted to '2mg'$  daily AM and '2mg'$  in the afternoon on M, W, F. Metolazone once per week was continued. Jardiance/Farxiga deferred due to frequent UTI.   Subsequent labs 08/04/22 with creatinine 1.78 and Metolazone was recommended to reduce to weekly PRN.   Presents today for follow  up independently. Notes about a month ago she had the flu but has been recovering. Tells me her primary care has started hydrocodone for back pain as surgery not recommended at this time per neurosurgery. She has not been taking Metolazone. She is taking Bumex only '2mg'$  every morning and none in the afternoon.   Reports no shortness of breath nor dyspnea on exertion. Reports no chest pain, pressure, or tightness. No edema, orthopnea, PND. Reports no palpitations. Tells me her lips still feel puffy.   EKGs/Labs/Other Studies Reviewed:   The following studies were reviewed today:   Right/Left Cardiac Catheterization 08/03/2016: The left ventricular systolic function is normal. LV end diastolic pressure is normal. The left ventricular ejection fraction is 55-65% by visual estimate. There is no aortic valve stenosis. There is severe mitral valve stenosis. Prox Cx to Mid Cx lesion, 50 %stenosed. Mid RCA-2 lesion, 80  %stenosed. Mid RCA-1 lesion, 80 %stenosed. Hemodynamic findings consistent with mild pulmonary hypertension. LV end diastolic pressure is normal.   1. Coronary artery disease   - 50% mid LCx   - 80% sequential lesions in the mid RCA 2. Normal LV function 3. Severe mitral stenosis. MV gradient of 13 mm Hg. MVA 0.99 cm squared with index 0.57. 4. Moderate to severe mitral insufficiency 5. Mild pulmonary HTN. 6. Normal LV EDP 7. No significant AV gradient 8. Occluded right brachial artery.   Plan: surgical evaluation for MVR and CABG. _______________   Echocardiogram 10/15/2019: Impressions: 1. Left ventricular ejection fraction, by visual estimation, is 65 to  70%. The left ventricle has hyperdynamic function. Left ventricular septal  wall thickness was mildly increased. There is mildly increased left  ventricular hypertrophy.   2. The left ventricle has no regional wall motion abnormalities.   3. Global right ventricle has normal systolic function.The right  ventricular size is normal. No increase in right ventricular wall  thickness.   4. Left atrial size was moderately dilated.   5. Right atrial size was moderately dilated.   6. The mitral valve has been repaired/replaced. No evidence of mitral  valve regurgitation. No evidence of mitral stenosis.   7. Patient has a 29 mm biprosthetic valve functioning normally with no  PVL The mitral struts protrude into the LVOT quite a bit likely  contributing to some of the gradient across the LVOT/AV. Creates a small  "neointimal LVOT" area.   8. The tricuspid valve is normal in structure.   9. The aortic valve is tricuspid. Aortic valve regurgitation is not  visualized. Mild aortic valve stenosis.  10. Pulmonic regurgitation is mild.  11. The pulmonic valve was grossly normal. Pulmonic valve regurgitation is  mild.  12. Mildly elevated pulmonary artery systolic pressure. ___________________  Echo 10/30/21  1. Left ventricular  ejection fraction, by estimation, is 60 to 65%. The  left ventricle has normal function. The left ventricle has no regional  wall motion abnormalities. Left ventricular diastolic parameters are  indeterminate.   2. Right ventricular systolic function is mildly reduced. The right  ventricular size is mildly enlarged. There is moderately elevated  pulmonary artery systolic pressure.   3. Left atrial size was moderately dilated.   4. Right atrial size was moderately dilated.   5. Normal appearing bioprosthetic MVR no PVL low mean gradient at HR 58  bpm Stent struts protrude into LVOT but no obvious sub valvular gradients  . The mitral valve has been repaired/replaced. No evidence of mitral valve  regurgitation. No evidence of  mitral  stenosis.   6. Tricuspid valve regurgitation is moderate.   7. The aortic valve is tricuspid. There is moderate calcification of the  aortic valve. There is moderate thickening of the aortic valve. Aortic  valve regurgitation is not visualized. Mild aortic valve stenosis.   8. The inferior vena cava is normal in size with greater than 50%  respiratory variability, suggesting right atrial pressure of 3 mmHg.   EKG:  EKG is ordered today. EKG performed today demonstrates junctional rhythm 50 bpm with no acute St/T wave changes. .   Recent Labs: 01/05/2022: TSH 1.716 04/19/2022: BNP 133.2; Magnesium 1.7 06/14/2022: ALT 13 07/30/2022: Hemoglobin 12.5; Platelets 215 08/04/2022: BUN 26; Creatinine, Ser 1.78; Potassium 5.0; Sodium 140  Recent Lipid Panel    Component Value Date/Time   CHOL 297 (H) 06/14/2022 1032   TRIG 295 (H) 06/14/2022 1032   HDL 44 06/14/2022 1032   CHOLHDL 6.8 (H) 06/14/2022 1032   CHOLHDL 2.8 07/09/2020 1403   VLDL 42.8 (H) 09/24/2019 1054   LDLCALC 195 (H) 06/14/2022 1032   LDLCALC 77 07/09/2020 1403   LDLDIRECT 68.0 09/24/2019 1054    Risk Assessment/Calculations:    CHA2DS2-VASc Score = 6   This indicates a 9.7% annual risk of  stroke. The patient's score is based upon: CHF History: 1 HTN History: 1 Diabetes History: 0 Stroke History: 0 Vascular Disease History: 1 Age Score: 2 Gender Score: 1      Home Medications   Current Meds  Medication Sig   acetaminophen (TYLENOL) 500 MG tablet Take 1,000 mg by mouth every 6 (six) hours as needed for mild pain.   albuterol (PROVENTIL) (2.5 MG/3ML) 0.083% nebulizer solution Take 3 mLs (2.5 mg total) by nebulization every 4 (four) hours as needed for wheezing or shortness of breath.   albuterol (VENTOLIN HFA) 108 (90 Base) MCG/ACT inhaler INHALE 2 PUFFS INTO THE LUNGS EVERY 4 HOURS AS NEEDED FOR AHEEZING OR SHORTNESS OF BREATH   amoxicillin (AMOXIL) 400 MG/5ML suspension Take 10 mLs (800 mg total) by mouth 2 (two) times daily.   bumetanide (BUMEX) 2 MG tablet Please take '2mg'$  daily in the morning and then '2mg'$  in the afternoon on MONDAY, [place] (Patient taking differently: Take 2 mg by mouth daily.)   cholecalciferol (VITAMIN D3) 25 MCG (1000 UNIT) tablet Take 1,000 Units by mouth daily.   diazepam (VALIUM) 5 MG tablet TAKE ONE TABLET AT BEDTIME   estradiol (ESTRACE VAGINAL) 0.1 MG/GM vaginal cream USE 2 grams per vagina daily for 2 weeks and then 1 gram per vagina three times per week.   fluticasone (FLONASE) 50 MCG/ACT nasal spray Place 1 spray into both nostrils daily as needed for allergies or rhinitis.   HYDROcodone-acetaminophen (NORCO/VICODIN) 5-325 MG tablet Take 1 tablet by mouth every 8 (eight) hours.   metoprolol succinate (TOPROL XL) 25 MG 24 hr tablet Take 1.5 tablets (37.5 mg total) by mouth daily.   Multiple Vitamins-Minerals (HAIR/SKIN/NAILS/BIOTIN PO) Take 1 tablet by mouth daily.   Multiple Vitamins-Minerals (MULTIVITAMIN ADULT EXTRA C) CHEW Chew 2 tablets by mouth daily.   phenazopyridine (PYRIDIUM) 200 MG tablet Take 1 tablet (200 mg total) by mouth 3 (three) times daily as needed for pain.   potassium chloride (KLOR-CON) 10 MEQ tablet TAKE  UP TO 8 TABLETS DAILY AS DIRECTED   sacubitril-valsartan (ENTRESTO) 24-26 MG Take 1 tablet by mouth 2 (two) times daily.   traMADol (ULTRAM) 50 MG tablet TAKE ONE TABLET BY MOUTH EVERY 6 HOURS AS NEEDED FOR  UP TO FIVE DAYS   warfarin (COUMADIN) 2 MG tablet Take 1 tablet daily or take as directed by anticoagulation clinic     Review of Systems      All other systems reviewed and are otherwise negative except as noted above.  Physical Exam    VS:  BP 132/62 (BP Location: Left Arm, Patient Position: Sitting, Cuff Size: Normal)   Pulse (!) 50   Ht '5\' 3"'$  (1.6 m)   Wt 134 lb (60.8 kg)   SpO2 96%   BMI 23.74 kg/m  , BMI Body mass index is 23.74 kg/m.  Wt Readings from Last 3 Encounters:  10/11/22 134 lb (60.8 kg)  09/14/22 134 lb 11.2 oz (61.1 kg)  08/30/22 138 lb 6.4 oz (62.8 kg)    GEN: Well nourished, well developed, in no acute distress. HEENT: normal. Neck: Supple, no JVD, carotid bruits, or masses. Cardiac: RRR, no  rubs, or gallops. Gr 2/6 systolic murmur. No clubbing, cyanosis, edema. Radials/PT 2+ and equal bilaterally.  Respiratory:  Respirations regular and unlabored, clear to auscultation bilaterally. GI: Soft, nontender, nondistended. MS: No deformity or atrophy. Skin: Warm and dry, no rash. LE with bilateral varicose veins.  Neuro:  Strength and sensation are intact. Psych: Normal affect.  Assessment & Plan    CAD s/p CAG - Stable with no anginal symptoms. No indication for ischemic evaluation.  GDMT includes statin, beta-blocker.  No aspirin due to chronic anticoagulation. Heart healthy diet and regular cardiovascular exercise encouraged.    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 Echo 10/30/21 normal appearing bioprosthetic MVR with no PVL. Update echo 10/2022 for monitoring.   Chronic anticoagulation / Chronic atrial fibrillation - Rate controlled. Previously intolerant of amiodarone. Continue Metoprolol '25mg'$  QD.  Previous bradycardia on higher doses. Continue warfarin due to CHA2DS2-VASc of at least 6 (agex2, gender, CHF, HTN, MI). Follows with coumadin clinic at her PCP. Denies bleeding complications.   Chronic diastolic heart failure - GDMT Entresto, Bumex, Metoprolol, PRN Metolazone.Defer SGLT2i due to frequent UTI.  Prior intolerance to Spironolactone. Low sodium diet, fluid restriction <2L, and daily weights encouraged. Educated to contact our office for weight gain of 2 lbs overnight or 5 lbs in one week. BMP, BNP today. She is euvolemic on exam. Continue Metolazone 2.'5mg'$  PRN weekly. She is taking Bumex '2mg'$  in the AM only and not additional afternoon doses. Will continue same until her labs result.   Aortic stenosis - Mild AS by echo 10/2021  Continue optimal BP and volume control. Update echocardiogram 10/2022.   Carotid artery stenosis - 02/2021 bilateral 1-39% stenosis. Lipid management, as below. No aspirin due to chronic anticoagulation. Repeat imaging as clinically indicated.  HTN -BP well controlled. Continue current antihypertensive regimen.  She will continue to monitor at home.   HLD - Previously did not tolerate Rosuvastatin, Simvastatin, Pitavastatin. Managed with diet and exercise.   Disposition: Follow up in 4 months with Dr. Oval Linsey  Signed, Loel Dubonnet, NP 10/11/2022, 9:23 AM [place]

## 2022-10-11 ENCOUNTER — Encounter: Payer: Self-pay | Admitting: Family Medicine

## 2022-10-11 ENCOUNTER — Ambulatory Visit (HOSPITAL_BASED_OUTPATIENT_CLINIC_OR_DEPARTMENT_OTHER): Payer: Medicare Other | Admitting: Family

## 2022-10-11 ENCOUNTER — Ambulatory Visit (INDEPENDENT_AMBULATORY_CARE_PROVIDER_SITE_OTHER): Payer: Medicare Other

## 2022-10-11 ENCOUNTER — Encounter (HOSPITAL_BASED_OUTPATIENT_CLINIC_OR_DEPARTMENT_OTHER): Payer: Self-pay | Admitting: Family

## 2022-10-11 ENCOUNTER — Ambulatory Visit (INDEPENDENT_AMBULATORY_CARE_PROVIDER_SITE_OTHER): Payer: Medicare Other | Admitting: Family Medicine

## 2022-10-11 VITALS — BP 132/62 | HR 50 | Ht 63.0 in | Wt 134.0 lb

## 2022-10-11 VITALS — BP 126/64 | HR 47 | Temp 98.0°F | Ht 63.0 in | Wt 130.4 lb

## 2022-10-11 DIAGNOSIS — Z7901 Long term (current) use of anticoagulants: Secondary | ICD-10-CM

## 2022-10-11 DIAGNOSIS — E785 Hyperlipidemia, unspecified: Secondary | ICD-10-CM | POA: Diagnosis not present

## 2022-10-11 DIAGNOSIS — G8929 Other chronic pain: Secondary | ICD-10-CM

## 2022-10-11 DIAGNOSIS — I25118 Atherosclerotic heart disease of native coronary artery with other forms of angina pectoris: Secondary | ICD-10-CM

## 2022-10-11 DIAGNOSIS — I5032 Chronic diastolic (congestive) heart failure: Secondary | ICD-10-CM

## 2022-10-11 DIAGNOSIS — I350 Nonrheumatic aortic (valve) stenosis: Secondary | ICD-10-CM | POA: Diagnosis not present

## 2022-10-11 DIAGNOSIS — M545 Low back pain, unspecified: Secondary | ICD-10-CM

## 2022-10-11 LAB — POCT INR: INR: 1.9 — AB (ref 2.0–3.0)

## 2022-10-11 MED ORDER — METOLAZONE 5 MG PO TABS: ORAL_TABLET | ORAL | 1 refills | Status: DC

## 2022-10-11 NOTE — Patient Instructions (Addendum)
Medication Instructions:  Your physician has recommended you make the following change in your medication:    Take Metolazone once per week as needed for weight gain of 2 pounds overnight or 5 pounds in one week.   Continue Bumex one tablet ('2mg'$ ) in the morning. Based on your labs today we will decide if we need to add back the afternoon dose.   *If you need a refill on your cardiac medications before your next appointment, please call your pharmacy*   Lab Work: Your physician recommends that you return for lab work today: BMP, BNP  If you have labs (blood work) drawn today and your tests are completely normal, you will receive your results only by: MyChart Message (if you have MyChart) OR A paper copy in the mail If you have any lab test that is abnormal or we need to change your treatment, we will call you to review the results.   Testing/Procedures: Your physician has requested that you have an echocardiogram for monitoring of your aortic stenosis in February. Echocardiography is a painless test that uses sound waves to create images of your heart. It provides your doctor with information about the size and shape of your heart and how well your heart's chambers and valves are working. This procedure takes approximately one hour. There are no restrictions for this procedure. Please do NOT wear cologne, perfume, aftershave, or lotions (deodorant is allowed). Please arrive 15 minutes prior to your appointment time.    Follow-Up: At [facility], you and your health needs are our priority.  As part of our continuing mission to provide you with exceptional heart care, we have created designated Provider Care Teams.  These Care Teams include your primary Cardiologist (physician) and Advanced Practice Providers (APPs -  Physician Assistants and Nurse Practitioners) who all work together to provide you with the care you need, when you need it.  We recommend signing up for the patient  portal called "MyChart".  Sign up information is provided on this After Visit Summary.  MyChart is used to connect with patients for Virtual Visits (Telemedicine).  Patients are able to view lab/test results, encounter notes, upcoming appointments, etc.  Non-urgent messages can be sent to your provider as well.   To learn more about what you can do with MyChart, go to [email or web address].    Your next appointment:   4 month(s)  Provider:   [name], MD or [name], NP    Other Instructions  To prevent or reduce lower extremity swelling: Eat a low salt diet. Salt makes the body hold onto extra fluid which causes swelling. Sit with legs elevated. For example, in the recliner or on an [place].  Wear knee-high compression stockings during the daytime. Ones labeled 15-20 mmHg provide good compression.  Heart Healthy Diet Recommendations: A low-salt diet is recommended. Meats should be grilled, baked, or boiled. Avoid fried foods. Focus on lean protein sources like fish or chicken with vegetables and fruits. The American Heart Association is a Microbiologist!  American Heart Association Diet and Lifeystyle Recommendations   Exercise recommendations: The American Heart Association recommends 150 minutes of moderate intensity exercise weekly. Try 30 minutes of moderate intensity exercise 4-5 times per week. This could include walking, jogging, or swimming.

## 2022-10-11 NOTE — Progress Notes (Signed)
Increase dose today to take 1 tablets and then change weekly dose to take 1 tablet daily except take 1/2 tablet on Mondays and Fridays. Recheck in 3 weeks.

## 2022-10-11 NOTE — Progress Notes (Signed)
Established Patient Office Visit  Subjective   Patient ID: Catherine [name], female    DOB: [birth year]  Age: 88 y.o. MRN: [id number]  Chief Complaint  Patient presents with   Follow-up    HPI   Catherine [name] is seen for follow-up regarding her chronic back pain.  She had a couple of prior surgeries.  She has been having daily chronic pain for years and becoming more more inactive secondary to her pain.  She was told by her neurosurgical group that there was nothing else they could offer.  She related frequent 8 out of 10 pain greatly compromising her quality of life and not relieved with over-the-counter medications.  She is on chronic Coumadin and cannot take nonsteroidals.  Pain, as previously noted, mostly lower lumbar area with radiation into both buttocks.  We decided to offer chronic pain medication option and she started Vicodin 5/325 mg 1 every 8 hours.  She has actually been taking 1 every 12 hours for most part and states her pain is greatly improved.  She states when she takes pain medication her pain frequently goes down to about 1-2 out of 10 and has greatly improved her quality of life.  She has not had any major constipation issues yet and is taking daily stool softener.  Past Medical History:  Diagnosis Date   Acute on chronic diastolic heart failure ([place])    AKI (acute kidney injury) ([place]) 06/22/2022   Aortic stenosis, mild 07/26/2017   Arthritis    Asthma    last attack 02/2015   Atrial fibrillation, chronic (HCC)    Cellulitis of left lower extremity 08/17/2016   Ulcer associated with severe venous insufficiency   Chronic anticoagulation    Chronic diastolic CHF (congestive heart failure) ([place])    Chronic kidney disease    "RIGHT MANY KIDNEY INFECTIONS AND STONES"   COPD (chronic obstructive pulmonary disease) (HCC)    Coronary artery disease    Dizziness    H/O: rheumatic fever    Heart murmur    Hypertension    PONV (postoperative nausea and vomiting)    ' SOMETIMES',  BUT NOT ALWAYS"   S/P Maze operation for atrial fibrillation 10/14/2016   Complete bilateral atrial lesion set using cryothermy and bipolar radiofrequency ablation - atrial appendage was not treated due to previous surgical procedure (open mitral commissurotomy)   S/P mitral valve replacement with bioprosthetic valve 10/14/2016   29 mm Medtronic Mosaic porcine bioprosthetic tissue valve   Tricuspid regurgitation 07/14/2021   UTI (urinary tract infection)    Valvular heart disease    Has mitral stenosis with prior mitral commissurotomy in 1970   Past Surgical History:  Procedure Laterality Date   ABDOMINAL HYSTERECTOMY  1983   endometriosis   APPENDECTOMY     BACK SURGERY     neurosurgery x2   CARDIAC CATHETERIZATION     CARDIAC CATHETERIZATION N/A 08/03/2016   Procedure: Right/Left Heart Cath and Coronary Angiography;  Surgeon: Peter M Martinique, MD;  Location: [facility];  Service: Cardiovascular;  Laterality: N/A;   cataract surg     CHOLECYSTECTOMY N/A 05/30/2015   Procedure: LAPAROSCOPIC CHOLECYSTECTOMY WITH INTRAOPERATIVE CHOLANGIOGRAM;  Surgeon: Excell Seltzer, MD;  Location: [facility];  Service: General;  Laterality: N/A;   COLONOSCOPY     CORONARY ARTERY BYPASS GRAFT N/A 10/14/2016   Procedure: CORONARY ARTERY BYPASS GRAFTING (CABG);  Surgeon: Rexene Alberts, MD;  Location: [facility];  Service: Open Heart Surgery;  Laterality: N/A;  EYE SURGERY     MAZE N/A 10/14/2016   Procedure: MAZE;  Surgeon: Rexene Alberts, MD;  Location: [facility];  Service: Open Heart Surgery;  Laterality: N/A;   MITRAL VALVE REPLACEMENT N/A 10/14/2016   Procedure: REDO MITRAL VALVE REPLACEMENT (MVR);  Surgeon: Rexene Alberts, MD;  Location: [facility];  Service: Open Heart Surgery;  Laterality: N/A;   MITRAL VALVE SURGERY Left 1970   Open mitral commissurotomy via left thoracotomy approach   TEE WITHOUT CARDIOVERSION N/A 07/05/2016   Procedure: TRANSESOPHAGEAL ECHOCARDIOGRAM (TEE);  Surgeon: [name], MD;   Location: [facility];  Service: Cardiovascular;  Laterality: N/A;   TEE WITHOUT CARDIOVERSION N/A 10/14/2016   Procedure: TRANSESOPHAGEAL ECHOCARDIOGRAM (TEE);  Surgeon: Rexene Alberts, MD;  Location: [facility];  Service: Open Heart Surgery;  Laterality: N/A;    reports that she quit smoking about 47 years ago. Her smoking use included cigarettes. She has a 15.00 pack-year smoking history. She has never used smokeless tobacco. She reports that she does not drink alcohol and does not use drugs. family history includes Catherine [name] in her father. Allergies  Allergen Reactions   Aldactone [Spironolactone] Other (See Comments)    dyspnea   Amoxicillin Palpitations    Tachycardia Has patient had a PCN reaction causing immediate rash, facial/tongue/throat swelling, SOB or lightheadedness with hypotension: no Has patient had a PCN reaction causing severe rash involving mucus membranes or skin necrosis: {no Has patient had a PCN reaction that required hospitalization {no Has patient had a PCN reaction occurring within the last 10 years: {yes If all of the above answers are "NO", then may proceed with Cephalosporin use.   Diltiazem Other (See Comments)    Causing headaches    Flagyl [Metronidazole Hcl] Other (See Comments)    Causing headaches     Flovent [Fluticasone Propionate] Other (See Comments)    Leg cramps   Gabapentin Swelling   Lyrica [Pregabalin] Swelling   Quinidine Diarrhea and Other (See Comments)    Fever diarrhea   Simvastatin Other (See Comments)    Leg pain, myalgia   Tramadol Nausea Only   Verapamil Other (See Comments)    myalgias   Amlodipine     Low extremity edema   Crestor [Rosuvastatin]     myalgia   Livalo [Pitavastatin]     myalgias   Zetia [Ezetimibe]     LEG CRAMPS    Benazepril Hcl Cough   Ciprofloxacin Diarrhea   Codeine Nausea Only   Nitrofurantoin Monohyd Macro Nausea Only    Review of Systems  Constitutional:  Negative for chills and fever.   Cardiovascular:  Negative for chest pain.  Musculoskeletal:  Positive for back pain.      Objective:     BP 126/64 (BP Location: Left Arm, Patient Position: Sitting, Cuff Size: Normal)   Pulse (!) 47   Temp 98 F (36.7 C) (Oral)   Ht '5\' 3"'$  (1.6 m)   Wt 130 lb 6.4 oz (59.1 kg)   SpO2 95%   BMI 23.10 kg/m  BP Readings from Last 3 Encounters:  10/11/22 126/64  10/11/22 132/62  09/14/22 (!) 140/70   Wt Readings from Last 3 Encounters:  10/11/22 130 lb 6.4 oz (59.1 kg)  10/11/22 134 lb (60.8 kg)  09/14/22 134 lb 11.2 oz (61.1 kg)      Physical Exam Vitals reviewed.  Constitutional:      Appearance: Normal appearance.  Cardiovascular:     Rate and Rhythm: Normal rate.  Pulmonary:  Effort: Pulmonary effort is normal.     Breath sounds: Normal breath sounds. No wheezing or rales.  Musculoskeletal:     Right lower leg: No edema.     Left lower leg: No edema.  Neurological:     Mental Status: She is alert.      Results for orders placed or performed in visit on 10/11/22  POCT INR  Result Value Ref Range   INR 1.9 (A) 2.0 - 3.0      The ASCVD Risk score (Arnett DK, et al., 2019) failed to calculate for the following reasons:   The 2019 ASCVD risk score is only valid for ages 17 to 74    Assessment & Plan:   Chronic low back pain.  Patient has had multiple interventions from neurosurgery without relief and pain was greatly compromising her quality of life and inadequate relief from over-the-counter medications.  We recently started Vicodin 5/3 and 25 mg 1 every 8 hours and she has actually been taking this about every 12 hours.  Her pain relief has been great.  She has tried to stretch out her medications and has found some difficulty around 6 PM when preparing dinner but otherwise fairly well-controlled.  Thus far, no major adverse side effects.  -Continue current medication.  We explained that she can reduce interval to every 8 hours if needed.  She will be  due for refill in about 1 week.  We reviewed requirements for chronic pain management.  She signed controlled medication contract last visit. [name], MD

## 2022-10-11 NOTE — Patient Instructions (Addendum)
Pre visit review using our clinic review tool, if applicable. No additional management support is needed unless otherwise documented below in the visit note.  Increase dose today to take 1 tablets and then change weekly dose to take 1 tablet daily except take 1/2 tablet on Mondays and Fridays. Recheck in 3 weeks.

## 2022-10-12 ENCOUNTER — Telehealth (HOSPITAL_BASED_OUTPATIENT_CLINIC_OR_DEPARTMENT_OTHER): Payer: Self-pay

## 2022-10-12 LAB — BASIC METABOLIC PANEL
BUN/Creatinine Ratio: 11 — ABNORMAL LOW (ref 12–28)
BUN: 12 mg/dL (ref 8–27)
CO2: 25 mmol/L (ref 20–29)
Calcium: 9.7 mg/dL (ref 8.7–10.3)
Chloride: 104 mmol/L (ref 96–106)
Creatinine, Ser: 1.06 mg/dL — ABNORMAL HIGH (ref 0.57–1.00)
Glucose: 90 mg/dL (ref 70–99)
Potassium: 4.2 mmol/L (ref 3.5–5.2)
Sodium: 143 mmol/L (ref 134–144)
eGFR: 51 mL/min/{1.73_m2} — ABNORMAL LOW (ref >59)

## 2022-10-12 LAB — BRAIN NATRIURETIC PEPTIDE: BNP: 181.8 pg/mL — ABNORMAL HIGH (ref 0.0–100.0)

## 2022-10-12 MED ORDER — BUMETANIDE 2 MG PO TABS: ORAL_TABLET | ORAL | 3 refills | Status: DC

## 2022-10-12 NOTE — Telephone Encounter (Addendum)
Results called to patient who verbalizes understanding! Patient verbalizes understanding. Updated medication list mailed to patient.     ----- Message from Loel Dubonnet, NP sent at 10/12/2022  3:14 PM EST ----- Kidney function improved to baseline. BNP shows worsening volume overload.   May use Metolazone AS NEEDED once per week for weight gain of 2 pounds overnight or 5 pounds in one week.   Recommend she return to Bumex '2mg'$  daily in the morning with an extra tablet in the afternoon Mondays, Wednesdays, Fridays. (She is presently taking only in the morning)  Please mail copy of update med list to patient.

## 2022-10-14 ENCOUNTER — Other Ambulatory Visit: Payer: Self-pay | Admitting: Family Medicine

## 2022-10-15 ENCOUNTER — Ambulatory Visit: Payer: Medicare Other | Admitting: Family Medicine

## 2022-10-18 ENCOUNTER — Ambulatory Visit: Payer: Medicare Other

## 2022-10-21 DIAGNOSIS — D0439 Carcinoma in situ of skin of other parts of face: Secondary | ICD-10-CM | POA: Diagnosis not present

## 2022-10-26 ENCOUNTER — Telehealth: Payer: Self-pay | Admitting: *Deleted

## 2022-10-26 NOTE — Patient Outreach (Signed)
  Care Coordination   10/26/2022 Name: Catherine [name] MRN: [id number] DOB: 1934/11/11   Care Coordination Outreach Attempts:  An unsuccessful telephone outreach was attempted today to offer the patient information about available care coordination services as a benefit of their health plan.   Follow Up Plan:  Additional outreach attempts will be made to offer the patient care coordination information and services.   Encounter Outcome:  No Answer   Care Coordination Interventions:  No, not indicated    Raina Mina, RN Care Management Coordinator [place] Office [phone]

## 2022-10-28 ENCOUNTER — Telehealth: Payer: Self-pay

## 2022-10-28 NOTE — Progress Notes (Signed)
Care Management & Coordination Services Pharmacy Team  Reason for Encounter: COPD  Contacted patient to discuss COPD disease state. Spoke with patient on 10/28/2022   Current COPD regimen:  Proventil 0.083% nebulizer solution as needed, patient states she is no longer needing to use this medication.  Ventolin HFA 108 MCG/ACT 2 puffs every 4 hours as needed     05/28/2022   10:00 AM  CAT Score  Total CAT Score 9   Any recent hospitalizations or ED visits since last visit with CPP? No recent hospital visits noted.  Reports denies COPD symptoms, including none  What recent interventions/DTPs have been made by any provider to improve breathing since last visit: No recent interventions noted  Have you had exacerbation/flare-up since last visit? Patient denies any flare ups, she states she has been doing very good.   What do you do when you are short of breath?  Patient states she will use her ventolin inhaler however, she uses it very rare.   Respiratory Devices/Equipment Do you have a nebulizer? No, patient states she no longer needs this Do you use a Peak Flow Meter? No Do you use a maintenance inhaler? No How often do you forget to use your daily inhaler? Patient does not have a daily inhaler Do you use a rescue inhaler? Yes How often do you use your rescue inhaler?  Very rare, only every couple months Do you use a spacer with your inhaler? No  Adherence Review: Does the patient have >5 day gap between last estimated fill date for maintenance inhaler medications? No   Chart Review:  Recent office visits:  10/11/2022 [name] MD - Patient was seen for Chronic bilateral low back pain, unspecified whether sciatica present. No medication changes.   09/14/2022 [name] MD - Patient was seen for cough and additional concerns. Started Cephalexin 500 mg 3 times daily and Norco 5/325 mg every 8 hours.   Recent consult visits:  10/11/2022 [name] NP (cardiology)  - Patient was seen for Coronary artery disease of native artery of native heart with stable angina pectoris and additional concerns. Changed Metolazone 5 mg from daily to one tablet as needed once a week 30 minutes before bumex as needed only for weight gain 2 lbs overnight or 5 lbs in one week.   08/30/2022 Chesley Mires MD (cardiology) - Patient was seen for COPD with asthma. Started Flonase nasal spray.   Hospital visits:  None  Medications: Outpatient Encounter Medications as of 10/28/2022  Medication Sig   acetaminophen (TYLENOL) 500 MG tablet Take 1,000 mg by mouth every 6 (six) hours as needed for mild pain.   albuterol (PROVENTIL) (2.5 MG/3ML) 0.083% nebulizer solution Take 3 mLs (2.5 mg total) by nebulization every 4 (four) hours as needed for wheezing or shortness of breath.   albuterol (VENTOLIN HFA) 108 (90 Base) MCG/ACT inhaler INHALE 2 PUFFS INTO THE LUNGS EVERY 4 HOURS AS NEEDED FOR AHEEZING OR SHORTNESS OF BREATH   amoxicillin (AMOXIL) 400 MG/5ML suspension Take 10 mLs (800 mg total) by mouth 2 (two) times daily.   bumetanide (BUMEX) 2 MG tablet Please take '2mg'$  daily in the morning and then '2mg'$  in the afternoon on MONDAY, [facility] AND FRIDAY   cholecalciferol (VITAMIN D3) 25 MCG (1000 UNIT) tablet Take 1,000 Units by mouth daily.   diazepam (VALIUM) 5 MG tablet TAKE ONE TABLET AT BEDTIME   estradiol (ESTRACE VAGINAL) 0.1 MG/GM vaginal cream USE 2 grams per vagina daily for 2 weeks and then  1 gram per vagina three times per week.   fluticasone (FLONASE) 50 MCG/ACT nasal spray Place 1 spray into both nostrils daily as needed for allergies or rhinitis.   HYDROcodone-acetaminophen (NORCO/VICODIN) 5-325 MG tablet TAKE ONE TABLET EVERY 8 HOURS   metolazone (ZAROXOLYN) 5 MG tablet TAKE ONE TABLET AS NEEDED ONCE A WEEK 30 MINUTES BEFORE BUMEX DOSE. AS NEEDED ONLY FOR WEIGHT GAIN 2LBS OVERNIGHT OR 5 LBS IN ONE WEEK   metoprolol succinate (TOPROL XL) 25 MG 24 hr tablet Take 1.5 tablets (37.5  mg total) by mouth daily.   Multiple Vitamins-Minerals (HAIR/SKIN/NAILS/BIOTIN PO) Take 1 tablet by mouth daily.   Multiple Vitamins-Minerals (MULTIVITAMIN ADULT EXTRA C) CHEW Chew 2 tablets by mouth daily.   phenazopyridine (PYRIDIUM) 200 MG tablet Take 1 tablet (200 mg total) by mouth 3 (three) times daily as needed for pain.   potassium chloride (KLOR-CON) 10 MEQ tablet TAKE UP TO 8 TABLETS DAILY AS DIRECTED   sacubitril-valsartan (ENTRESTO) 24-26 MG Take 1 tablet by mouth 2 (two) times daily.   traMADol (ULTRAM) 50 MG tablet TAKE ONE TABLET BY MOUTH EVERY 6 HOURS AS NEEDED FOR UP TO FIVE DAYS   warfarin (COUMADIN) 2 MG tablet Take 1 tablet daily or take as directed by anticoagulation clinic   No facility-administered encounter medications on file as of 10/28/2022.   Fill History:   Dispensed Days Supply Quantity Provider Pharmacy  albuterol sulfate HFA 90 mcg/actuation aerosol inhaler 09/02/2022 30 8.5 g      Dispensed Days Supply Quantity Provider Pharmacy  bumetanide 2 mg tablet 09/24/2022 90 80 each      Dispensed Days Supply Quantity Provider Pharmacy  diazepam 5 mg tablet 10/06/2022 30 30 each      Dispensed Days Supply Quantity Provider Pharmacy  hydrocodone 5 mg-acetaminophen 325 mg tablet 10/14/2022 30 90 each      Dispensed Days Supply Quantity Provider Pharmacy  metolazone 5 mg tablet 10/11/2022 90 13 each      Dispensed Days Supply Quantity Provider Pharmacy  metoprolol succinate ER 25 mg tablet,extended release 24 hr 10/20/2022 30 45 each      Dispensed Days Supply Quantity Provider Pharmacy  phenazopyridine 200 mg tablet 08/03/2022 10 30 each      Dispensed Days Supply Quantity Provider Pharmacy  potassium chloride ER 10 mEq tablet,extended release 10/06/2022 90 720 each      Dispensed Days Supply Quantity Provider Pharmacy  ENTRESTO  24-26 MG TABS 05/04/2022 30 60 tablet      Dispensed Days Supply Quantity Provider Pharmacy  tramadol 50 mg tablet 08/16/2022 8  30 each      Dispensed Days Supply Quantity Provider Pharmacy  warfarin 2 mg tablet 08/26/2022 100 100 each       [place] Pharmacist Assistant [phone]

## 2022-11-03 ENCOUNTER — Other Ambulatory Visit (HOSPITAL_BASED_OUTPATIENT_CLINIC_OR_DEPARTMENT_OTHER): Payer: Self-pay | Admitting: *Deleted

## 2022-11-03 ENCOUNTER — Ambulatory Visit (INDEPENDENT_AMBULATORY_CARE_PROVIDER_SITE_OTHER): Payer: Medicare Other

## 2022-11-03 ENCOUNTER — Telehealth (HOSPITAL_BASED_OUTPATIENT_CLINIC_OR_DEPARTMENT_OTHER): Payer: Self-pay | Admitting: *Deleted

## 2022-11-03 DIAGNOSIS — I350 Nonrheumatic aortic (valve) stenosis: Secondary | ICD-10-CM

## 2022-11-03 DIAGNOSIS — I5032 Chronic diastolic (congestive) heart failure: Secondary | ICD-10-CM

## 2022-11-03 DIAGNOSIS — Z7901 Long term (current) use of anticoagulants: Secondary | ICD-10-CM

## 2022-11-03 LAB — ECHOCARDIOGRAM COMPLETE
AR max vel: 2.33 cm2
AV Area VTI: 2.2 cm2
AV Area mean vel: 2.22 cm2
AV Mean grad: 11 mmHg
AV Peak grad: 19.9 mmHg
Ao pk vel: 2.23 m/s
Area-P 1/2: 1.84 cm2
MV VTI: 2.1 cm2
S' Lateral: 2.85 cm

## 2022-11-03 LAB — POCT INR: INR: 2 (ref 2.0–3.0)

## 2022-11-03 NOTE — Telephone Encounter (Signed)
Patient in office today for Echo and heart rate in 40's however she was asymptomatic  Discussed with Dr Oval Linsey and will have patient decrease Toprol 25 mg 1 And 1/2 tablet daily to just 1 tablet daily   Advised patient, verbalized understanding

## 2022-11-03 NOTE — Patient Instructions (Addendum)
Pre visit review using our clinic review tool, if applicable. No additional management support is needed unless otherwise documented below in the visit note.  Continue 1 tablet daily except take 1/2 tablet on Mondays and Fridays. Recheck in 4 weeks.

## 2022-11-03 NOTE — Progress Notes (Signed)
Continue 1 tablet daily except take 1/2 tablet on Mondays and Fridays. Recheck in 4 weeks.

## 2022-11-04 ENCOUNTER — Other Ambulatory Visit (HOSPITAL_BASED_OUTPATIENT_CLINIC_OR_DEPARTMENT_OTHER): Payer: Self-pay

## 2022-11-04 ENCOUNTER — Telehealth: Payer: Self-pay | Admitting: Family Medicine

## 2022-11-04 DIAGNOSIS — I5043 Acute on chronic combined systolic (congestive) and diastolic (congestive) heart failure: Secondary | ICD-10-CM

## 2022-11-04 NOTE — Telephone Encounter (Signed)
Pt called to say she was seen yesterday and MD promised to send her some cream to treat the sores at the corners of her lips, but they went to the pharmacy this a.m and pharmacy said they have not received a prescription for such a cream.  Pt says she really needs this cream sent to: [place], [place] Phone: [phone]  Fax: [phone]      As soon as possible.

## 2022-11-05 MED ORDER — CLOTRIMAZOLE-BETAMETHASONE 1-0.05 % EX CREA: TOPICAL_CREAM | CUTANEOUS | 0 refills | Status: DC

## 2022-11-05 NOTE — Telephone Encounter (Signed)
RX sent  

## 2022-11-08 ENCOUNTER — Other Ambulatory Visit: Payer: Self-pay | Admitting: Family Medicine

## 2022-11-09 DIAGNOSIS — L821 Other seborrheic keratosis: Secondary | ICD-10-CM | POA: Diagnosis not present

## 2022-11-09 DIAGNOSIS — C44311 Basal cell carcinoma of skin of nose: Secondary | ICD-10-CM | POA: Diagnosis not present

## 2022-11-09 DIAGNOSIS — L570 Actinic keratosis: Secondary | ICD-10-CM | POA: Diagnosis not present

## 2022-11-11 ENCOUNTER — Telehealth: Payer: Self-pay | Admitting: Family Medicine

## 2022-11-11 ENCOUNTER — Telehealth (HOSPITAL_BASED_OUTPATIENT_CLINIC_OR_DEPARTMENT_OTHER): Payer: Self-pay | Admitting: Cardiovascular Disease

## 2022-11-11 MED ORDER — HYDROCODONE-ACETAMINOPHEN 5-325 MG PO TABS: ORAL_TABLET | ORAL | 0 refills | Status: DC

## 2022-11-11 NOTE — Addendum Note (Signed)
Addended by: [name] on: 11/11/2022 05:11 PM   Modules accepted: Orders

## 2022-11-11 NOTE — Telephone Encounter (Signed)
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

## 2022-11-11 NOTE — Telephone Encounter (Signed)
Patient called to report BP readings  Date    BP       HR    Time 2/7     143/63  46 PM 2/8 119/50  50 AM 2/9 131/57  49 PM 2/10 116/47  51 AM 2/11 129/59  52 PM 2/12 132/51  51 AM 2/13 141/60  46 PM 2/14 124/60  51 AM 2/15 145/60  53 AM  Patient stated she was taken off the half of her BP medication and would like a call back on next steps.

## 2022-11-11 NOTE — Telephone Encounter (Signed)
Patient asking tht her refill for HYDROcodone-acetaminophen (NORCO/VICODIN) 5-325 MG tablet be entered in advance since the provider is going to be out of the office when she needs it filled.  [place], [place] Phone: [phone]  Fax: [phone]    Asks that someone call her to update

## 2022-11-11 NOTE — Telephone Encounter (Signed)
I went ahead and refilled with note to pharmacy to fill on or after 11-14-22.  [name] MD [facility]

## 2022-11-19 ENCOUNTER — Telehealth (HOSPITAL_BASED_OUTPATIENT_CLINIC_OR_DEPARTMENT_OTHER): Payer: Self-pay | Admitting: Cardiovascular Disease

## 2022-11-19 ENCOUNTER — Telehealth: Payer: Self-pay | Admitting: Cardiovascular Disease

## 2022-11-19 DIAGNOSIS — I5032 Chronic diastolic (congestive) heart failure: Secondary | ICD-10-CM

## 2022-11-19 MED ORDER — METOPROLOL SUCCINATE ER 25 MG PO TB24: ORAL_TABLET | ORAL | 2 refills | Status: DC

## 2022-11-19 MED ORDER — METOPROLOL SUCCINATE ER 25 MG PO TB24: 25.0000 mg | ORAL_TABLET | Freq: Every day | ORAL | 2 refills | Status: DC

## 2022-11-19 NOTE — Telephone Encounter (Signed)
Rx(s) sent to pharmacy electronically.  

## 2022-11-19 NOTE — Telephone Encounter (Signed)
Reduce toprol to 12.'5mg'$  QD (half tablet)  BP overall average 136/58 which is reasonable.   Would recommend she report back with BP readings in a week.   Loel Dubonnet, NP

## 2022-11-19 NOTE — Telephone Encounter (Signed)
*  STAT* If patient is at the pharmacy, call can be transferred to refill team.   1. Which medications need to be refilled? (please list name of each medication and dose if known)  Metoprolol Succinate 25 MG  2. Which pharmacy/location (including street and city if local pharmacy) is medication to be sent to? [place], [place]  3. Do they need a 30 day or 90 day supply?  90 day supply

## 2022-11-19 NOTE — Telephone Encounter (Signed)
Patient currently taking Toprol 25 mg 1 daily   Will forward updated readings to Dr Oval Linsey and Overton Mam NP for review

## 2022-11-19 NOTE — Telephone Encounter (Signed)
Spoke with Catherine [name] and advised actually decreasing to Toprol 25 mg 1/2 tablet daily, see phone note 2/23 New Rx sent

## 2022-11-19 NOTE — Telephone Encounter (Signed)
Pt c/o medication issue:  1. Name of Medication:   metoprolol succinate (TOPROL-XL) 25 MG 24 hr tablet   2. How are you currently taking this medication (dosage and times per day)? As prescribed  3. Are you having a reaction (difficulty breathing--STAT)?   4. What is your medication issue?   Caller stated they have a prescription for the patient to take 1.5 tablets and today's prescription states to take 1 tablet.  Caller wants to confirm the change is an intentional decrease of this medication.

## 2022-11-19 NOTE — Telephone Encounter (Signed)
Pt c/o BP issue: STAT if pt c/o blurred vision, one-sided weakness or slurred speech  1. What are your last 5 BP readings?   2/16: 150/67 47 AM 2/17:129/54 46 PM 2/18: 141/63 54 AM 2/19: 148/54 49 PM 2/20: 125/48 51 PM  2/22: 122/59 48 PM    2. Are you having any other symptoms (ex. Dizziness, headache, blurred vision, passed out)?   3. What is your BP issue?   Patient is calling to report BP readings.

## 2022-11-19 NOTE — Telephone Encounter (Signed)
[name], MD  to Me     11/19/22  9:52 AM BP is reasonable. OK with reducing metoprolol due to bradycardia.  TCR

## 2022-11-22 NOTE — Telephone Encounter (Signed)
Late entry- Advised patient 2/23 of recommendation, verbalized understanding

## 2022-11-25 ENCOUNTER — Telehealth (HOSPITAL_BASED_OUTPATIENT_CLINIC_OR_DEPARTMENT_OTHER): Payer: Self-pay | Admitting: Cardiovascular Disease

## 2022-11-25 NOTE — Telephone Encounter (Signed)
BP log follow up as requested

## 2022-11-25 NOTE — Telephone Encounter (Signed)
Patient calling back with bp readying  2/24: 136/60 HR 53 during the evening  2/25: 144/55 HR 43 during the evening   2/26: 136/53 HR 55  taking during morning  2/27:  119/58 HR 55 during the evening  2/28:  120/57 HR 57 taken during th eveing   2/29: 121/71 HR 78 taken this morning

## 2022-11-25 NOTE — Telephone Encounter (Signed)
BP average 129/60 which is well controlled. Heart rate has appropriately increased a bit. Continue current medications.   Loel Dubonnet, NP

## 2022-11-25 NOTE — Telephone Encounter (Signed)
Returned call to patient, no answer, left detailed message, ok per DPR.     "BP average 129/60 which is well controlled. Heart rate has appropriately increased a bit. Continue current medications.    Loel Dubonnet, NP "

## 2022-11-29 ENCOUNTER — Telehealth: Payer: Self-pay

## 2022-11-29 ENCOUNTER — Ambulatory Visit (INDEPENDENT_AMBULATORY_CARE_PROVIDER_SITE_OTHER): Payer: Medicare Other

## 2022-11-29 DIAGNOSIS — Z7901 Long term (current) use of anticoagulants: Secondary | ICD-10-CM | POA: Diagnosis not present

## 2022-11-29 LAB — POCT INR: INR: 1.7 — AB (ref 2.0–3.0)

## 2022-11-29 NOTE — Progress Notes (Signed)
Increase dose today to take 1 tablet and then change weekly dose to take 1 tablet daily except take 1/2 tablet on Fridays. Recheck in 3 weeks.

## 2022-11-29 NOTE — Patient Instructions (Addendum)
Pre visit review using our clinic review tool, if applicable. No additional management support is needed unless otherwise documented below in the visit note.  Increase dose today to take 1 tablet and then change weekly dose to take 1 tablet daily except take 1/2 tablet on Fridays. Recheck in 3 weeks.

## 2022-11-29 NOTE — Telephone Encounter (Signed)
Pt called to [facility] coumadin clinic apt so it could be placed on the same day as PCP apt. She also asked to verify her husbands apt with PCP since they have the same PCP. Pt's husband, Will, gave verbal permission to enter his chart to verify date and time of next apt with PCP. Verified date and time for him and the pt. Pt verbalized understanding.

## 2022-12-15 ENCOUNTER — Encounter: Payer: Self-pay | Admitting: Family Medicine

## 2022-12-15 ENCOUNTER — Ambulatory Visit (INDEPENDENT_AMBULATORY_CARE_PROVIDER_SITE_OTHER): Payer: Medicare Other

## 2022-12-15 ENCOUNTER — Ambulatory Visit (INDEPENDENT_AMBULATORY_CARE_PROVIDER_SITE_OTHER): Payer: Medicare Other | Admitting: Family Medicine

## 2022-12-15 VITALS — BP 140/64 | HR 55 | Temp 98.0°F | Ht 63.0 in | Wt 130.2 lb

## 2022-12-15 DIAGNOSIS — Z7901 Long term (current) use of anticoagulants: Secondary | ICD-10-CM | POA: Diagnosis not present

## 2022-12-15 DIAGNOSIS — H1033 Unspecified acute conjunctivitis, bilateral: Secondary | ICD-10-CM | POA: Diagnosis not present

## 2022-12-15 DIAGNOSIS — M545 Low back pain, unspecified: Secondary | ICD-10-CM

## 2022-12-15 DIAGNOSIS — I10 Essential (primary) hypertension: Secondary | ICD-10-CM | POA: Diagnosis not present

## 2022-12-15 DIAGNOSIS — L570 Actinic keratosis: Secondary | ICD-10-CM

## 2022-12-15 DIAGNOSIS — G8929 Other chronic pain: Secondary | ICD-10-CM | POA: Diagnosis not present

## 2022-12-15 LAB — POCT INR: INR: 2.4 (ref 2.0–3.0)

## 2022-12-15 MED ORDER — HYDROCODONE-ACETAMINOPHEN 5-325 MG PO TABS: ORAL_TABLET | ORAL | 0 refills | Status: DC

## 2022-12-15 MED ORDER — POLYMYXIN B-TRIMETHOPRIM 10000-0.1 UNIT/ML-% OP SOLN: 2.0000 [drp] | OPHTHALMIC | 0 refills | Status: DC

## 2022-12-15 NOTE — Progress Notes (Addendum)
Continue 1 tablet daily except take 1/2 tablet on Fridays. Recheck in 5 weeks.

## 2022-12-15 NOTE — Patient Instructions (Addendum)
Pre visit review using our clinic review tool, if applicable. No additional management support is needed unless otherwise documented below in the visit note.  Continue 1 tablet daily except take 1/2 tablet on Fridays. Recheck in 5 weeks.

## 2022-12-15 NOTE — Progress Notes (Signed)
Established Patient Office Visit  Subjective   Patient ID: Catherine [name], female    DOB: October 15, 1934  Age: 88 y.o. MRN: [id number]  Chief Complaint  Patient presents with   Medical Management of Chronic Issues    HPI   Catherine [name] is seen for follow-up today for management of several issues as below accompanied by her husband.  Her past medical history is reviewed and includes atrial fibrillation, CAD, hypertension, aortic stenosis, history of rheumatic mitral stenosis, COPD, chronic kidney disease, history of kidney stones, chronic Coumadin therapy, chronic insomnia, chronic back pain.  She had previous bypass surgery and also previous aortic valve surgery for stenosis.  Overall doing fairly well at this time.  Her major complaint is ongoing chronic back pain with severe radiculitis features at times on the right side.  She is seen neurosurgeon and physiatrist it was getting little relief.  We eventually agreed to start hydrocodone 2-3 times daily as needed for severe pain.  She did not get much relief with Tylenol or Ultram.  She is currently taking this usually twice daily.  Having quite a bit of pain midday.  Usually takes 1 dose around 6:30 AM and another dose around 6 PM.  She is taking a stool softener and denies any recent constipation issues.  She complains of eye issue with some matting in the corners of both eyes past several days and states that her eyes have been full of drainage early in the mornings especially.  She has history of bilateral peripheral edema which is currently stable.  She is weighing regularly and her weights are reviewed and have been very stable from 123 to 129 pounds.  She had some problems with her pulse dropping occasionally in the 30s and her dosage of metoprolol was reduced and pulse recently in the 50s.  Her blood pressures look to be very well-controlled by her current log  Past Medical History:  Diagnosis Date   Acute on chronic diastolic heart failure  (HCC)    AKI (acute kidney injury) ([place]) 06/22/2022   Aortic stenosis, mild 07/26/2017   Arthritis    Asthma    last attack 02/2015   Atrial fibrillation, chronic (HCC)    Cellulitis of left lower extremity 08/17/2016   Ulcer associated with severe venous insufficiency   Chronic anticoagulation    Chronic diastolic CHF (congestive heart failure) ([place])    Chronic kidney disease    "RIGHT MANY KIDNEY INFECTIONS AND STONES"   COPD (chronic obstructive pulmonary disease) (HCC)    Coronary artery disease    Dizziness    H/O: rheumatic fever    Heart murmur    Hypertension    PONV (postoperative nausea and vomiting)    ' SOMETIMES', BUT NOT ALWAYS"   S/P Maze operation for atrial fibrillation 10/14/2016   Complete bilateral atrial lesion set using cryothermy and bipolar radiofrequency ablation - atrial appendage was not treated due to previous surgical procedure (open mitral commissurotomy)   S/P mitral valve replacement with bioprosthetic valve 10/14/2016   29 mm Medtronic Mosaic porcine bioprosthetic tissue valve   Tricuspid regurgitation 07/14/2021   UTI (urinary tract infection)    Valvular heart disease    Has mitral stenosis with prior mitral commissurotomy in 1970   Past Surgical History:  Procedure Laterality Date   ABDOMINAL HYSTERECTOMY  1983   endometriosis   APPENDECTOMY     BACK SURGERY     neurosurgery x2   CARDIAC CATHETERIZATION     CARDIAC CATHETERIZATION  N/A 08/03/2016   Procedure: Right/Left Heart Cath and Coronary Angiography;  Surgeon: Peter M Martinique, MD;  Location: [facility];  Service: Cardiovascular;  Laterality: N/A;   cataract surg     CHOLECYSTECTOMY N/A 05/30/2015   Procedure: LAPAROSCOPIC CHOLECYSTECTOMY WITH INTRAOPERATIVE CHOLANGIOGRAM;  Surgeon: Excell Seltzer, MD;  Location: [facility];  Service: General;  Laterality: N/A;   COLONOSCOPY     CORONARY ARTERY BYPASS GRAFT N/A 10/14/2016   Procedure: CORONARY ARTERY BYPASS GRAFTING (CABG);  Surgeon:  Rexene Alberts, MD;  Location: [facility];  Service: Open Heart Surgery;  Laterality: N/A;   EYE SURGERY     MAZE N/A 10/14/2016   Procedure: MAZE;  Surgeon: Rexene Alberts, MD;  Location: [place];  Service: Open Heart Surgery;  Laterality: N/A;   MITRAL VALVE REPLACEMENT N/A 10/14/2016   Procedure: REDO MITRAL VALVE REPLACEMENT (MVR);  Surgeon: Rexene Alberts, MD;  Location: [facility];  Service: Open Heart Surgery;  Laterality: N/A;   MITRAL VALVE SURGERY Left 1970   Open mitral commissurotomy via left thoracotomy approach   TEE WITHOUT CARDIOVERSION N/A 07/05/2016   Procedure: TRANSESOPHAGEAL ECHOCARDIOGRAM (TEE);  Surgeon: [name], MD;  Location: [facility];  Service: Cardiovascular;  Laterality: N/A;   TEE WITHOUT CARDIOVERSION N/A 10/14/2016   Procedure: TRANSESOPHAGEAL ECHOCARDIOGRAM (TEE);  Surgeon: Rexene Alberts, MD;  Location: [place];  Service: Open Heart Surgery;  Laterality: N/A;    reports that she quit smoking about 47 years ago. Her smoking use included cigarettes. She has a 15.00 pack-year smoking history. She has never used smokeless tobacco. She reports that she does not drink alcohol and does not use drugs. family history includes Leukemia in her father. Allergies  Allergen Reactions   Aldactone [Spironolactone] Other (See Comments)    dyspnea   Amoxicillin Palpitations    Tachycardia Has patient had a PCN reaction causing immediate rash, facial/tongue/throat swelling, SOB or lightheadedness with hypotension: no Has patient had a PCN reaction causing severe rash involving mucus membranes or skin necrosis: {no Has patient had a PCN reaction that required hospitalization {no Has patient had a PCN reaction occurring within the last 10 years: {yes If all of the above answers are "NO", then may proceed with Cephalosporin use.   Diltiazem Other (See Comments)    Causing headaches    Flagyl [Metronidazole Hcl] Other (See Comments)    Causing headaches     Flovent  [Fluticasone Propionate] Other (See Comments)    Leg cramps   Gabapentin Swelling   Lyrica [Pregabalin] Swelling   Quinidine Diarrhea and Other (See Comments)    Fever diarrhea   Simvastatin Other (See Comments)    Leg pain, myalgia   Tramadol Nausea Only   Verapamil Other (See Comments)    myalgias   Amlodipine     Low extremity edema   Crestor [Rosuvastatin]     myalgia   Livalo [Pitavastatin]     myalgias   Zetia [Ezetimibe]     LEG CRAMPS    Benazepril Hcl Cough   Ciprofloxacin Diarrhea   Codeine Nausea Only   Nitrofurantoin Monohyd Macro Nausea Only     Review of Systems  Constitutional:  Negative for chills, fever and malaise/fatigue.  Eyes:  Positive for discharge and redness. Negative for blurred vision and pain.  Respiratory:  Negative for shortness of breath.   Cardiovascular:  Negative for chest pain.  Neurological:  Negative for dizziness, weakness and headaches.      Objective:  BP (!) 140/64 (BP Location: Left Arm, Patient Position: Sitting, Cuff Size: Normal)   Pulse (!) 55   Temp 98 F (36.7 C) (Oral)   Ht 5\' 3"  (1.6 m)   Wt 130 lb 3.2 oz (59.1 kg)   SpO2 98%   BMI 23.06 kg/m  BP Readings from Last 3 Encounters:  12/15/22 (!) 140/64  10/11/22 126/64  10/11/22 132/62   Wt Readings from Last 3 Encounters:  12/15/22 130 lb 3.2 oz (59.1 kg)  10/11/22 130 lb 6.4 oz (59.1 kg)  10/11/22 134 lb (60.8 kg)      Physical Exam Vitals reviewed.  Constitutional:      Appearance: Normal appearance.  Eyes:     Comments: Conjunctive only minimally erythematous bilaterally.  No purulent drainage at this time.  Did not notice any obvious scaling around the eyelids.  Cardiovascular:     Rate and Rhythm: Normal rate.  Pulmonary:     Effort: Pulmonary effort is normal.     Breath sounds: Normal breath sounds. No wheezing or rales.  Musculoskeletal:     Right lower leg: No edema.     Left lower leg: No edema.  Neurological:     Mental Status: She  is alert.     Results for orders placed or performed in visit on 12/15/22  POCT INR  Result Value Ref Range   INR 2.4 2.0 - 3.0    Last CBC Lab Results  Component Value Date   WBC 9.1 07/30/2022   HGB 12.5 07/30/2022   HCT 37.1 07/30/2022   MCV 89.4 07/30/2022   MCH 30.1 07/30/2022   RDW 12.8 07/30/2022   PLT 215 [id number]   Last metabolic panel Lab Results  Component Value Date   GLUCOSE 90 10/11/2022   NA 143 10/11/2022   K 4.2 10/11/2022   CL 104 10/11/2022   CO2 25 10/11/2022   BUN 12 10/11/2022   CREATININE 1.06 (H) 10/11/2022   EGFR 51 (L) 10/11/2022   CALCIUM 9.7 10/11/2022   PROT 6.2 06/14/2022   ALBUMIN 4.3 06/14/2022   LABGLOB 1.9 06/14/2022   AGRATIO 2.3 (H) 06/14/2022   BILITOT 0.6 06/14/2022   ALKPHOS 65 06/14/2022   AST 28 06/14/2022   ALT 13 06/14/2022   ANIONGAP 12 07/30/2022   Last lipids Lab Results  Component Value Date   CHOL 297 (H) 06/14/2022   HDL 44 06/14/2022   LDLCALC 195 (H) 06/14/2022   LDLDIRECT 68.0 09/24/2019   TRIG 295 (H) 06/14/2022   CHOLHDL 6.8 (H) 06/14/2022   Last thyroid functions Lab Results  Component Value Date   TSH 1.716 01/05/2022      The ASCVD Risk score (Arnett DK, et al., 2019) failed to calculate for the following reasons:   The 2019 ASCVD risk score is only valid for ages 42 to 74    Assessment & Plan:   #1 hypertension stable by home readings.  She is monitoring this regularly at home.  She is currently on Entresto and low-dose metoprolol and blood pressure stable  #2 chronic low back pain with intermittent radiculitis symptoms on the right.  She is aware of cautions with benzodiazepine and opioid.  She takes low-dose diazepam at night and has so for years.  She is currently taking hydrocodone just twice daily.  We discussed measures to reduce constipation.  Refill sent.  #3 probable mild bacterial conjunctivitis bilaterally.  Continue warm compresses.  Send in Polytrim ophthalmic drops 2 drops  each eye every  4-6 hours while awake and be in touch if eye symptoms not clearing over the next few days  [name], MD

## 2022-12-15 NOTE — Patient Instructions (Signed)
If right forearm lesion not resolving in 3-4 weeks need to follow up with dermatologist.

## 2022-12-16 ENCOUNTER — Encounter: Payer: Medicare Other | Admitting: Family Medicine

## 2022-12-16 ENCOUNTER — Telehealth: Payer: Self-pay | Admitting: Family Medicine

## 2022-12-16 ENCOUNTER — Encounter: Payer: Self-pay | Admitting: Family Medicine

## 2022-12-16 NOTE — Telephone Encounter (Signed)
Patient informed of the message below and verbalized understanding. Patient reported drainage and redness and has been scheduled to see [facility] tomorrow. I attempted to schedule the patient with Dr. Maudie Mercury but patient was unable to [facility] virtual visit

## 2022-12-16 NOTE — Telephone Encounter (Signed)
Patient reported that if her eyes are feeling better by tomorrow she will contact our office to cancel her appointment.

## 2022-12-16 NOTE — Telephone Encounter (Signed)
Patient says trimethoprim-polymyxin b (POLYTRIM) ophthalmic solution makes her eyes burn and feel like there is sand in them, swollen underneath eye area. Please advise as to whether she should discontinue. Aware that provider is not here today.

## 2022-12-17 ENCOUNTER — Encounter: Payer: Self-pay | Admitting: Adult Health

## 2022-12-17 ENCOUNTER — Ambulatory Visit (INDEPENDENT_AMBULATORY_CARE_PROVIDER_SITE_OTHER): Payer: Medicare Other | Admitting: Adult Health

## 2022-12-17 VITALS — BP 120/60 | HR 53 | Temp 98.1°F | Ht 63.0 in | Wt 132.0 lb

## 2022-12-17 DIAGNOSIS — H1033 Unspecified acute conjunctivitis, bilateral: Secondary | ICD-10-CM | POA: Diagnosis not present

## 2022-12-17 DIAGNOSIS — T7840XA Allergy, unspecified, initial encounter: Secondary | ICD-10-CM

## 2022-12-17 MED ORDER — METHYLPREDNISOLONE ACETATE 80 MG/ML IJ SUSP
80.0000 mg | Freq: Once | INTRAMUSCULAR | Status: AC
  Administered 2022-12-17: 80 mg via INTRAMUSCULAR

## 2022-12-17 MED ORDER — ERYTHROMYCIN 5 MG/GM OP OINT: 1.0000 | TOPICAL_OINTMENT | Freq: Three times a day (TID) | OPHTHALMIC | 0 refills | Status: AC

## 2022-12-17 NOTE — Patient Instructions (Addendum)
It was great seeing you today   We gave you a steroid injection in the office today   I have also sent in a new antibiotic for your eyes,place a ribbon on the inside of the bottom lid  3 times a day for 7 days

## 2022-12-17 NOTE — Progress Notes (Signed)
Subjective:    Patient ID: Catherine [name], female    DOB: [birth year], 88 y.o.   MRN: [id number]  HPI 88 year old female who  has a past medical history of Acute on chronic diastolic heart failure ([place]), AKI (acute kidney injury) ([place]) (06/22/2022), Aortic stenosis, mild (07/26/2017), Arthritis, Asthma, Atrial fibrillation, chronic ([place]), Cellulitis of left lower extremity (08/17/2016), Chronic anticoagulation, Chronic diastolic CHF (congestive heart failure) ([place]), Chronic kidney disease, COPD (chronic obstructive pulmonary disease) ([place]), Coronary artery disease, Dizziness, H/O: rheumatic fever, Heart murmur, Hypertension, PONV (postoperative nausea and vomiting), S/P Maze operation for atrial fibrillation (10/14/2016), S/P mitral valve replacement with bioprosthetic valve (10/14/2016), Tricuspid regurgitation (07/14/2021), UTI (urinary tract infection), and Valvular heart disease.  She presents to the office today for an acute issue. She was seen 2 days ago by her PCP and treated with Polytrim ophthalmic drops for suspected bacterial conjunctivitis bilaterally. . She used the drops th night she got them  and when she woke up the next day her eyes were swollen shut. She continues to have drainage and her eyes will matt shut. Orbital swelling has improved but still present. She has been placing warm compresses on eyes.    Review of Systems See HPI   Past Medical History:  Diagnosis Date   Acute on chronic diastolic heart failure (HCC)    AKI (acute kidney injury) ([place]) 06/22/2022   Aortic stenosis, mild 07/26/2017   Arthritis    Asthma    last attack 02/2015   Atrial fibrillation, chronic (HCC)    Cellulitis of left lower extremity 08/17/2016   Ulcer associated with severe venous insufficiency   Chronic anticoagulation    Chronic diastolic CHF (congestive heart failure) ([place])    Chronic kidney disease    "RIGHT MANY KIDNEY INFECTIONS AND STONES"   COPD (chronic obstructive pulmonary  disease) (HCC)    Coronary artery disease    Dizziness    H/O: rheumatic fever    Heart murmur    Hypertension    PONV (postoperative nausea and vomiting)    ' SOMETIMES', BUT NOT ALWAYS"   S/P Maze operation for atrial fibrillation 10/14/2016   Complete bilateral atrial lesion set using cryothermy and bipolar radiofrequency ablation - atrial appendage was not treated due to previous surgical procedure (open mitral commissurotomy)   S/P mitral valve replacement with bioprosthetic valve 10/14/2016   29 mm Medtronic Mosaic porcine bioprosthetic tissue valve   Tricuspid regurgitation 07/14/2021   UTI (urinary tract infection)    Valvular heart disease    Has mitral stenosis with prior mitral commissurotomy in 1970    Social History   Socioeconomic History   Marital status: Married    Spouse name: Not on file   Number of children: Not on file   Years of education: Not on file   Highest education level: Not on file  Occupational History   Not on file  Tobacco Use   Smoking status: Former    Packs/day: 1.00    Years: 15.00    Additional pack years: 0.00    Total pack years: 15.00    Types: Cigarettes    Quit date: 09/28/1975    Years since quitting: 47.2   Smokeless tobacco: Never  Vaping Use   Vaping Use: Never used  Substance and Sexual Activity   Alcohol use: No    Alcohol/week: 0.0 standard drinks of alcohol   Drug use: No   Sexual activity: Yes  Other Topics Concern   Not on  file  Social History Narrative   Not on file   Social Determinants of Health   Financial Resource Strain: Low Risk  (04/09/2022)   Overall Financial Resource Strain (CARDIA)    Difficulty of Paying Living Expenses: Not hard at all  Food Insecurity: No Food Insecurity (04/09/2022)   Hunger Vital Sign    Worried About Running Out of Food in the Last Year: Never true    Ran Out of Food in the Last Year: Never true  Transportation Needs: No Transportation Needs (04/09/2022)   PRAPARE -  Hydrologist (Medical): No    Lack of Transportation (Non-Medical): No  Physical Activity: Insufficiently Active (04/09/2022)   Exercise Vital Sign    Days of Exercise per Week: 5 days    Minutes of Exercise per Session: 20 min  Stress: No Stress Concern Present (04/09/2022)   [place]    Feeling of Stress : Not at all  Social Connections: [place] (04/09/2022)   Social Connection and Isolation Panel [NHANES]    Frequency of Communication with Friends and Family: More than three times a week    Frequency of Social Gatherings with Friends and Family: More than three times a week    Attends Religious Services: More than 4 times per year    Active Member of Clubs or Organizations: Yes    Attends Archivist Meetings: More than 4 times per year    Marital Status: Married  Human resources officer Violence: Not At Risk (04/09/2022)   Humiliation, Afraid, Rape, and Kick questionnaire    Fear of Current or Ex-Partner: No    Emotionally Abused: No    Physically Abused: No    Sexually Abused: No    Past Surgical History:  Procedure Laterality Date   ABDOMINAL HYSTERECTOMY  1983   endometriosis   APPENDECTOMY     BACK SURGERY     neurosurgery x2   CARDIAC CATHETERIZATION     CARDIAC CATHETERIZATION N/A 08/03/2016   Procedure: Right/Left Heart Cath and Coronary Angiography;  Surgeon: Peter M Martinique, MD;  Location: [place];  Service: Cardiovascular;  Laterality: N/A;   cataract surg     CHOLECYSTECTOMY N/A 05/30/2015   Procedure: LAPAROSCOPIC CHOLECYSTECTOMY WITH INTRAOPERATIVE CHOLANGIOGRAM;  Surgeon: Excell Seltzer, MD;  Location: [place];  Service: General;  Laterality: N/A;   COLONOSCOPY     CORONARY ARTERY BYPASS GRAFT N/A 10/14/2016   Procedure: CORONARY ARTERY BYPASS GRAFTING (CABG);  Surgeon: Rexene Alberts, MD;  Location: [place];  Service: Open Heart Surgery;   Laterality: N/A;   EYE SURGERY     MAZE N/A 10/14/2016   Procedure: MAZE;  Surgeon: Rexene Alberts, MD;  Location: [place];  Service: Open Heart Surgery;  Laterality: N/A;   MITRAL VALVE REPLACEMENT N/A 10/14/2016   Procedure: REDO MITRAL VALVE REPLACEMENT (MVR);  Surgeon: Rexene Alberts, MD;  Location: [place];  Service: Open Heart Surgery;  Laterality: N/A;   MITRAL VALVE SURGERY Left 1970   Open mitral commissurotomy via left thoracotomy approach   TEE WITHOUT CARDIOVERSION N/A 07/05/2016   Procedure: TRANSESOPHAGEAL ECHOCARDIOGRAM (TEE);  Surgeon: [name], MD;  Location: [place];  Service: Cardiovascular;  Laterality: N/A;   TEE WITHOUT CARDIOVERSION N/A 10/14/2016   Procedure: TRANSESOPHAGEAL ECHOCARDIOGRAM (TEE);  Surgeon: Rexene Alberts, MD;  Location: [place];  Service: Open Heart Surgery;  Laterality: N/A;    Family History  Problem Relation  Age of Onset   Leukemia Father    Breast cancer Neg Hx     Allergies  Allergen Reactions   Aldactone [Spironolactone] Other (See Comments)    dyspnea   Amoxicillin Palpitations    Tachycardia Has patient had a PCN reaction causing immediate rash, facial/tongue/throat swelling, SOB or lightheadedness with hypotension: no Has patient had a PCN reaction causing severe rash involving mucus membranes or skin necrosis: {no Has patient had a PCN reaction that required hospitalization {no Has patient had a PCN reaction occurring within the last 10 years: {yes If all of the above answers are "NO", then may proceed with Cephalosporin use.   Diltiazem Other (See Comments)    Causing headaches    Flagyl [Metronidazole Hcl] Other (See Comments)    Causing headaches     Flovent [Fluticasone Propionate] Other (See Comments)    Leg cramps   Gabapentin Swelling   Lyrica [Pregabalin] Swelling   Quinidine Diarrhea and Other (See Comments)    Fever diarrhea   Simvastatin Other (See Comments)    Leg pain, myalgia   Tramadol Nausea Only    Verapamil Other (See Comments)    myalgias   Amlodipine     Low extremity edema   Crestor [Rosuvastatin]     myalgia   Livalo [Pitavastatin]     myalgias   Polymyxin B-Trimethoprim Swelling   Zetia [Ezetimibe]     LEG CRAMPS    Benazepril Hcl Cough   Ciprofloxacin Diarrhea   Codeine Nausea Only   Nitrofurantoin Monohyd Macro Nausea Only    Current Outpatient Medications on File Prior to Visit  Medication Sig Dispense Refill   acetaminophen (TYLENOL) 500 MG tablet Take 1,000 mg by mouth every 6 (six) hours as needed for mild pain.     albuterol (PROVENTIL) (2.5 MG/3ML) 0.083% nebulizer solution Take 3 mLs (2.5 mg total) by nebulization every 4 (four) hours as needed for wheezing or shortness of breath. 75 mL 2   albuterol (VENTOLIN HFA) 108 (90 Base) MCG/ACT inhaler INHALE 2 PUFFS INTO THE LUNGS EVERY 4 HOURS AS NEEDED FOR AHEEZING OR SHORTNESS OF BREATH 8.5 g 2   bumetanide (BUMEX) 2 MG tablet Please take 2mg  daily in the morning and then 2mg  in the afternoon on MONDAY, [facility] AND FRIDAY 120 tablet 3   cholecalciferol (VITAMIN D3) 25 MCG (1000 UNIT) tablet Take 1,000 Units by mouth daily.     clotrimazole-betamethasone (LOTRISONE) cream Apply to affected rash twice daily as needed 15 g 0   diazepam (VALIUM) 5 MG tablet TAKE ONE TABLET AT BEDTIME 30 tablet 5   estradiol (ESTRACE VAGINAL) 0.1 MG/GM vaginal cream USE 2 grams per vagina daily for 2 weeks and then 1 gram per vagina three times per week. 42.5 g 11   fluticasone (FLONASE) 50 MCG/ACT nasal spray Place 1 spray into both nostrils daily as needed for allergies or rhinitis. 16 g 2   HYDROcodone-acetaminophen (NORCO/VICODIN) 5-325 MG tablet TAKE ONE TABLET EVERY 8 HOURS 90 tablet 0   metolazone (ZAROXOLYN) 5 MG tablet TAKE ONE TABLET AS NEEDED ONCE A WEEK 30 MINUTES BEFORE BUMEX DOSE. AS NEEDED ONLY FOR WEIGHT GAIN 2LBS OVERNIGHT OR 5 LBS IN ONE WEEK 30 tablet 1   metoprolol succinate (TOPROL-XL) 25 MG 24 hr tablet Take 1/2  tablet daily 45 tablet 2   Multiple Vitamins-Minerals (HAIR/SKIN/NAILS/BIOTIN PO) Take 1 tablet by mouth daily.     Multiple Vitamins-Minerals (MULTIVITAMIN ADULT EXTRA C) CHEW Chew 2 tablets by mouth daily.  phenazopyridine (PYRIDIUM) 200 MG tablet Take 1 tablet (200 mg total) by mouth 3 (three) times daily as needed for pain. 30 tablet 1   potassium chloride (KLOR-CON) 10 MEQ tablet TAKE UP TO 8 TABLETS DAILY AS DIRECTED 720 tablet 3   sacubitril-valsartan (ENTRESTO) 24-26 MG Take 1 tablet by mouth 2 (two) times daily. 180 tablet 3   trimethoprim-polymyxin b (POLYTRIM) ophthalmic solution Place 2 drops into both eyes every 4 (four) hours. 10 mL 0   warfarin (COUMADIN) 2 MG tablet Take 1 tablet daily or take as directed by anticoagulation clinic 100 tablet 1   No current facility-administered medications on file prior to visit.    BP 120/60   Pulse (!) 53   Temp 98.1 F (36.7 C) (Oral)   Ht 5\' 3"  (1.6 m)   Wt 132 lb (59.9 kg)   SpO2 94%   BMI 23.38 kg/m       Objective:   Physical Exam Vitals and nursing note reviewed.  Constitutional:      Appearance: Normal appearance.  Eyes:     Conjunctiva/sclera:     Right eye: Right conjunctiva is injected. Exudate present.     Left eye: Left conjunctiva is injected. Exudate present.     Comments: Mild periorbital edema bilaterally   Neurological:     General: No focal deficit present.     Mental Status: She is alert and oriented to person, place, and time.  Psychiatric:        Mood and Affect: Mood normal.        Behavior: Behavior normal.        Thought Content: Thought content normal.        Judgment: Judgment normal.       Assessment & Plan:  1. Acute bacterial conjunctivitis of both eyes - Will switch to erythromycin ointment. She has a lot of allergies to antibiotics - Follow up if no improvement in the next 2-3 days  - erythromycin ophthalmic ointment; Place 1 Application into both eyes 3 (three) times daily for 7  days.  Dispense: 21 g; Refill: 0  2. Allergic reaction, initial encounter  - methylPREDNISolone acetate (DEPO-MEDROL) injection 80 mg  Dorothyann Peng, NP  Time spent with patient today was 32 minutes which consisted of chart review, discussing diagnosis, work up, treatment answering questions and documentation.

## 2022-12-20 ENCOUNTER — Ambulatory Visit: Payer: Medicare Other

## 2022-12-21 ENCOUNTER — Telehealth: Payer: Self-pay

## 2022-12-21 NOTE — Progress Notes (Unsigned)
Care Management & Coordination Services Pharmacy Note  12/22/2022 Name:  Catherine [name] MRN:  [id number] DOB:  [birth year]  Summary: BP at goal <140/90 and denies significant weight fluctuations daily Last DEXA scan 2013, showed osteoporosis  Recommendations/Changes made from today's visit: -Continue to check weight daily, BP weekly and be mindful of salt intake -Recommend DEXA scan update, patient declines. Continue OTC Vitamin D and calcium in diet/MVI  Follow up plan: BP in 3 months Pharmacist visit in 6 months   Subjective: Catherine [name] is [name] 88 y.o. year old female who is a primary patient of Burchette, Alinda Sierras, MD.  The care coordination team was consulted for assistance with disease management and care coordination needs.    Engaged with patient by telephone for follow up visit.  Recent office visits: 12/17/22 Catherine Peng, NP - For acute bacterial conjunctivitis of both eyes, START Erythromycin ointment both eyes TID  12/15/22 Catherine An, RN - For Anti-Coag. INR 2.4. Continue 1mg  every Friday, and 2mg  all other days  12/15/22 Catherine Littler, MD - For HTN, stop amoxcillin and tramadol, START polytrim eye drops  10/11/2022 Catherine Littler MD - Patient was seen for Chronic bilateral low back pain, unspecified whether sciatica present. No medication changes.    09/14/2022 Catherine Littler MD - Patient was seen for cough and additional concerns. Started Cephalexin 500 mg 3 times daily and Norco 5/325 mg every 8 hours.  Recent consult visits: 11/19/22 Catherine Latch, MD (Cardio) - Phone call, decrease to Toprol 25mg  1/2 tab daily due to bradycardia  10/11/2022 Catherine Montana NP (cardiology) - Patient was seen for Coronary artery disease of native artery of native heart with stable angina pectoris and additional concerns. Changed Metolazone 5 mg from daily to one tablet as needed once a week 30 minutes before bumex as needed only for weight gain 2 lbs overnight or 5 lbs in  one week.    08/30/2022 Catherine Mires MD (cardiology) - Patient was seen for COPD with asthma. Started Flonase nasal spray  Hospital visits: None in previous 6 months   Objective:  Lab Results  Component Value Date   CREATININE 1.06 (H) 10/11/2022   BUN 12 10/11/2022   GFR 46.59 (L) 03/17/2022   EGFR 51 (L) 10/11/2022   GFRNONAA 51 (L) 07/30/2022   GFRAA 67 10/29/2020   NA 143 10/11/2022   K 4.2 10/11/2022   CALCIUM 9.7 10/11/2022   CO2 25 10/11/2022   GLUCOSE 90 10/11/2022    Lab Results  Component Value Date/Time   HGBA1C 5.3 10/12/2016 01:59 PM   GFR 46.59 (L) 03/17/2022 01:31 PM   GFR 62.02 12/21/2021 02:37 PM   MICROALBUR 1.6 12/21/2021 02:37 PM    Last diabetic Eye exam: No results found for: "HMDIABEYEEXA"  Last diabetic Foot exam: No results found for: "HMDIABFOOTEX"   Lab Results  Component Value Date   CHOL 297 (H) 06/14/2022   HDL 44 06/14/2022   LDLCALC 195 (H) 06/14/2022   LDLDIRECT 68.0 09/24/2019   TRIG 295 (H) 06/14/2022   CHOLHDL 6.8 (H) 06/14/2022       Latest Ref Rng & Units 06/14/2022   10:32 AM 01/05/2022    1:16 AM 12/21/2021    2:37 PM  Hepatic Function  Total Protein 6.0 - 8.5 g/dL 6.2  7.1  6.5   Albumin 3.7 - 4.7 g/dL 4.3  4.1  4.1   AST 0 - 40 IU/L 28  19  25    ALT 0 - 32 IU/L  13  21  16    Alk Phosphatase 44 - 121 IU/L 65  87  63   Total Bilirubin 0.0 - 1.2 mg/dL 0.6  0.2  0.5     Lab Results  Component Value Date/Time   TSH 1.716 01/05/2022 10:41 AM   TSH 3.39 11/30/2021 11:36 AM   TSH 3.95 07/09/2020 11:35 AM   FREET4 0.81 03/03/2015 10:51 AM   FREET4 0.81 03/04/2014 11:01 AM       Latest Ref Rng & Units 07/30/2022    2:41 PM 04/19/2022   11:39 AM 01/18/2022    1:08 PM  CBC  WBC 4.0 - 10.5 K/uL 9.1  7.2  16.6   Hemoglobin 12.0 - 15.0 g/dL 12.5  12.6  15.1   Hematocrit 36.0 - 46.0 % 37.1  37.4  41.4   Platelets 150 - 400 K/uL 215  185  180     Lab Results  Component Value Date/Time   VD25OH 25.0 (L) 01/20/2021 12:09  PM   VD25OH 36 06/06/2012 11:09 AM   VITAMINB12 374 11/30/2021 11:36 AM   VITAMINB12 781 02/22/2014 10:50 AM    Clinical ASCVD: Yes  The ASCVD Risk score (Arnett DK, et al., 2019) failed to calculate for the following reasons:   The 2019 ASCVD risk score is only valid for ages 28 to 54         10/11/2022   10:49 AM 04/09/2022   12:42 PM 11/06/2021   11:01 AM  Depression screen PHQ 2/9  Decreased Interest 2 0 0  Down, Depressed, Hopeless 1 0 0  PHQ - 2 Score 3 0 0  Altered sleeping 1    Tired, decreased energy 1    Change in appetite 1    Feeling bad or failure about yourself  1    Trouble concentrating 3    Moving slowly or fidgety/restless 0    Suicidal thoughts 0    PHQ-9 Score 10    Difficult doing work/chores Somewhat difficult       Social History   Tobacco Use  Smoking Status Former   Packs/day: 1.00   Years: 15.00   Additional pack years: 0.00   Total pack years: 15.00   Types: Cigarettes   Quit date: 09/28/1975   Years since quitting: 47.2  Smokeless Tobacco Never   BP Readings from Last 3 Encounters:  12/17/22 120/60  12/15/22 (!) 140/64  10/11/22 126/64   Pulse Readings from Last 3 Encounters:  12/17/22 (!) 53  12/15/22 (!) 55  10/11/22 (!) 47   Wt Readings from Last 3 Encounters:  12/17/22 132 lb (59.9 kg)  12/16/22 130 lb 3.2 oz (59.1 kg)  12/15/22 130 lb 3.2 oz (59.1 kg)   BMI Readings from Last 3 Encounters:  12/17/22 23.38 kg/m  12/16/22 23.06 kg/m  12/15/22 23.06 kg/m    Allergies  Allergen Reactions   Aldactone [Spironolactone] Other (See Comments)    dyspnea   Amoxicillin Palpitations    Tachycardia Has patient had a PCN reaction causing immediate rash, facial/tongue/throat swelling, SOB or lightheadedness with hypotension: no Has patient had a PCN reaction causing severe rash involving mucus membranes or skin necrosis: {no Has patient had a PCN reaction that required hospitalization {no Has patient had a PCN reaction  occurring within the last 10 years: {yes If all of the above answers are "NO", then may proceed with Cephalosporin use.   Diltiazem Other (See Comments)    Causing headaches    Flagyl [Metronidazole Hcl]  Other (See Comments)    Causing headaches     Flovent [Fluticasone Propionate] Other (See Comments)    Leg cramps   Gabapentin Swelling   Lyrica [Pregabalin] Swelling   Quinidine Diarrhea and Other (See Comments)    Fever diarrhea   Simvastatin Other (See Comments)    Leg pain, myalgia   Tramadol Nausea Only   Verapamil Other (See Comments)    myalgias   Amlodipine     Low extremity edema   Crestor [Rosuvastatin]     myalgia   Livalo [Pitavastatin]     myalgias   Polymyxin B-Trimethoprim Swelling   Zetia [Ezetimibe]     LEG CRAMPS    Benazepril Hcl Cough   Ciprofloxacin Diarrhea   Codeine Nausea Only   Nitrofurantoin Monohyd Macro Nausea Only    Medications Reviewed Today     Reviewed by Maren Reamer, RPH (Pharmacist) on 12/22/22 at 1041  Med List Status: <None>   Medication Order Taking? Sig Documenting Provider Last Dose Status Informant  acetaminophen (TYLENOL) 500 MG tablet [id number] No Take 1,000 mg by mouth every 6 (six) hours as needed for mild pain. [provider] Taking Active Self, Pharmacy Records  albuterol (PROVENTIL) (2.5 MG/3ML) 0.083% nebulizer solution [id number] No Take 3 mLs (2.5 mg total) by nebulization every 4 (four) hours as needed for wheezing or shortness of breath. Caren Griffins, MD Taking Active   albuterol (VENTOLIN HFA) 108 (90 Base) MCG/ACT inhaler [id number] No INHALE 2 PUFFS INTO THE LUNGS EVERY 4 HOURS AS NEEDED FOR AHEEZING OR SHORTNESS OF Ivette Loyal, MD Taking Active   bumetanide (BUMEX) 2 MG tablet [id number] No Please take 2mg  daily in the morning and then 2mg  in the afternoon on [place], [facility] AND FRIDAY Loel Dubonnet, NP Taking Active   cholecalciferol (VITAMIN D3) 25 MCG (1000 UNIT) tablet [id number]  No Take 1,000 Units by mouth daily. [provider] Taking Active Self, Pharmacy Records  clotrimazole-betamethasone (LOTRISONE) cream [id number] No Apply to affected rash twice daily as needed [name], MD Taking Active   diazepam (VALIUM) 5 MG tablet [id number] No TAKE ONE TABLET AT BEDTIME [name], MD Taking Active   erythromycin ophthalmic ointment [id number]  Place 1 Application into both eyes 3 (three) times daily for 7 days. Nafziger, Tommi Rumps, NP  Active   estradiol (ESTRACE VAGINAL) 0.1 MG/GM vaginal cream [id number] No USE 2 grams per vagina daily for 2 weeks and then 1 gram per vagina three times per week. [name], MD Taking Active   fluticasone [facility]) 50 MCG/ACT nasal spray [id number] No Place 1 spray into both nostrils daily as needed for allergies or rhinitis. Catherine Mires, MD Taking Active   HYDROcodone-acetaminophen (NORCO/VICODIN) 5-325 MG tablet [id number] No TAKE ONE TABLET EVERY 8 HOURS Burchette, Alinda Sierras, MD Taking Active   metolazone (ZAROXOLYN) 5 MG tablet [id number] No TAKE ONE TABLET AS NEEDED ONCE A WEEK 30 MINUTES BEFORE BUMEX DOSE. AS NEEDED ONLY FOR WEIGHT GAIN 2LBS OVERNIGHT OR 5 LBS IN ONE WEEK Loel Dubonnet, NP Taking Active   metoprolol succinate (TOPROL-XL) 25 MG 24 hr tablet [id number] No Take 1/2 tablet daily Loel Dubonnet, NP Taking Active   Multiple Vitamins-Minerals (HAIR/SKIN/NAILS/BIOTIN PO) [id number] No Take 1 tablet by mouth daily. [provider] Taking Active Self, Pharmacy Records  Multiple Vitamins-Minerals (MULTIVITAMIN ADULT EXTRA C) CHEW [id number] No Chew 2 tablets by mouth daily. [provider] Taking Active Self, Pharmacy Records  phenazopyridine (PYRIDIUM) 200 MG  tablet [id number] No Take 1 tablet (200 mg total) by mouth 3 (three) times daily as needed for pain. [name], MD Taking Active   potassium chloride (KLOR-CON) 10 MEQ tablet [id number] No TAKE UP TO 8 TABLETS DAILY AS DIRECTED  Catherine Latch, MD Taking Active   sacubitril-valsartan (ENTRESTO) 24-26 MG [id number] No Take 1 tablet by mouth 2 (two) times daily. Catherine Latch, MD Taking Active   trimethoprim-polymyxin b [facility]) ophthalmic solution [id number] No Place 2 drops into both eyes every 4 (four) hours. [name], MD Taking Active   warfarin (COUMADIN) 2 MG tablet [id number] No Take 1 tablet daily or take as directed by anticoagulation clinic Burchette, Alinda Sierras, MD Taking Active             SDOH:  (Social Determinants of Health) assessments and interventions performed: Yes SDOH Interventions    [place] Coordination from 12/22/2022 in [place] from 04/09/2022 in [place] at [place] Management from 11/06/2021 in [place] at [place] from 04/07/2021 in [place] [facility] [place] from 07/01/2020 in [place] at [place] Management from 06/17/2020 in [place] [facility] [place] Interventions Intervention Not Indicated Intervention Not Indicated Intervention Not Indicated Intervention Not Indicated -- --  Housing Interventions Intervention Not Indicated Intervention Not Indicated Intervention Not Indicated Intervention Not Indicated -- --  Transportation Interventions -- Intervention Not Indicated Intervention Not Indicated -- -- Intervention Not Indicated  Depression Interventions/Treatment  -- -- -- -- PHQ2-9 Score <4 Follow-up Not Indicated --  Financial Strain Interventions -- Intervention Not Indicated Intervention Not Indicated -- -- Other (Comment)  [Referred to [facility]]  Physical Activity Interventions -- Intervention Not Indicated Other (Comments)  [reviewed Silver Sneakers when able] Intervention Not Indicated -- --  Stress Interventions -- Intervention  Not Indicated Intervention Not Indicated -- -- --  Social Connections Interventions -- Intervention Not Indicated -- Intervention Not Indicated -- --       Medication Assistance: None required.  Patient affirms current coverage meets needs.  Medication Access: Within the past 30 days, how often has patient missed a dose of medication? None Is a pillbox or other method used to improve adherence? Yes  Factors that may affect medication adherence? no barriers identified Are meds synced by current pharmacy? No  Are meds delivered by current pharmacy? No  Does patient experience delays in picking up medications due to transportation concerns? No   [facility] Services Reviewed: Is patient disadvantaged to use [facility]?: No  Current Rx insurance plan: [facility] Name and location of Current pharmacy:  [place], [address] [place] [address] Phone: [phone] Fax: [place] [address] [place] [address] Phone: [phone] Fax: [place], [place] [place] [address] Phone: [phone] Fax: [phone]  [facility] ([place]) [place], [place] [address] [place] [address] Phone: [phone] Fax: [phone]  [place] [address] Phone: [phone] Fax: [phone]  [facility] services reviewed with patient today?: No  Patient requests to transfer care to [facility]?: No  Reason  patient declined to change pharmacies: Not mentioned at this visit  Compliance/Adherence/Medication fill history: Care Gaps: AWV - completed 04/09/2022, scheduled 04/14/2023  Last BP - 120/60 on 12/17/2022 Tdap - overdue Covid - overdue  Star-Rating Drugs: Entresto 24-26mg  BID PDC 0% --  Getting through PAP   Assessment/Plan   Heart Failure (Goal: manage symptoms and prevent exacerbations) -Controlled -Last ejection fraction: 60-65% (Date: 11/03/22) -HF type: HFpEF (EF > 50%) -NYHA Class: II (slight limitation of activity) -AHA HF Stage: C (Heart disease and symptoms present) -Current treatment: Bumex 2mg  1 BID MWF Appropriate, Effective, Safe, Accessible Metolazone 5mg  prn once weekly Appropriate, Effective, Safe, Accessible Metoprolol 25mg  1/2 tab qd Appropriate, Effective, Safe, Accessible Klor-Con 10 mEq up to 8 tabs daily UTD Appropriate, Effective, Safe, Accessible Entresto 24-26mg  BID Appropriate, Effective, Safe, Accessible -Medications previously tried: Atenolol, Lasix, Losartan, Spironolactone, Triamterene/HCTZ -Current home BP/HR readings: 3/1 116/48 pm; 3/2 121/57 HR 60 am; 3/3 146/59 HR 47 pm; 3/13 157/57 HR 51 -Current home daily weights: 123-128 throughout the month of March (denies <3lbs in on e day) -Current dietary habits: not adding salt to food and mindful of sodium content of labels -Current exercise habits: housework -Educated on Benefits of medications for managing symptoms and prolonging life Importance of weighing daily; if you gain more than 3 pounds in one day or 5 pounds in one week, notify cardio/follow fluid pill instructions Proper diuretic administration and potassium supplementation Importance of blood pressure control -Counseled on diet and exercise extensively Recommended to continue current medication   Osteoporosis / Osteopenia (Goal Prevent bone loss and fracture) -Not ideally controlled -Last DEXA Scan: 05/17/2012   T-Score femoral neck: LFN -2.7 -Patient is a candidate for pharmacologic treatment due to T-Score < -2.5 in femoral neck -Current treatment  None -Medications previously tried: None  -Recommend [phone] units of vitamin D daily. Recommend 1200 mg of calcium daily from dietary and supplemental sources. Recommend  weight-bearing and muscle strengthening exercises for building and maintaining bone density. -Recommended to continue current medication -Recommend updated DEXA scan, patient declines  [place] Pharmacist [phone]

## 2022-12-21 NOTE — Progress Notes (Signed)
Care Management & Coordination Services Pharmacy Team  Reason for Encounter: Appointment Reminder  Contacted patient to confirm telephone appointment with Burman Riis, PharmD on 12/22/2022 at 10:30. Spoke with patient on 12/21/2022   Do you have any problems getting your medications? Patient denies  What is your top health concern you would like to discuss at your upcoming visit? Patient was recently diagnosed with pink eye, bilateral and is still trying to get over it.   Have you seen any other providers since your last visit with PCP? Patient denies  Care Gaps: AWV - completed 04/09/2022, scheduled 04/14/2023  Last BP - 120/60 on 12/17/2022 Tdap - overdue Covid - overdue   Star Rating Drug: None   [place] Pharmacist Assistant [phone]

## 2022-12-21 NOTE — Progress Notes (Signed)
error 

## 2022-12-22 ENCOUNTER — Ambulatory Visit: Payer: Medicare Other

## 2022-12-23 ENCOUNTER — Telehealth: Payer: Self-pay | Admitting: Family Medicine

## 2022-12-23 MED ORDER — FLUCONAZOLE 150 MG PO TABS: 150.0000 mg | ORAL_TABLET | Freq: Once | ORAL | 0 refills | Status: AC

## 2022-12-23 NOTE — Telephone Encounter (Signed)
Patient states when she takes anything with erythromycin she gets a yeast infection, now taking erythromycin ophthalmic ointment. Saw Nafziger on 12/17/22. Requesting "the small pill" to help with yeast infection. Pls advise

## 2022-12-23 NOTE — Telephone Encounter (Signed)
Please advise 

## 2022-12-23 NOTE — Addendum Note (Signed)
Addended by: Gwenyth Ober R on: 12/23/2022 01:38 PM   Modules accepted: Orders

## 2022-12-23 NOTE — Telephone Encounter (Signed)
Pt notified of Rx. Pt claimed that she is having sharp pains that are getting worse. Pt stated that she is taking AZO with no relief. Advised pt that I will put her on the schedule in case it may be something else going on. Pt verbalized understanding. Pt stated if the diflucan helps she will call to cancel. Pt was nervous about not being able to call back in time due to the weekend and holidays so that she want get charged. I placed a note in pt appt. Note regarding charge.

## 2022-12-24 DIAGNOSIS — B3731 Acute candidiasis of vulva and vagina: Secondary | ICD-10-CM | POA: Diagnosis not present

## 2022-12-24 DIAGNOSIS — N3001 Acute cystitis with hematuria: Secondary | ICD-10-CM | POA: Diagnosis not present

## 2022-12-24 DIAGNOSIS — R309 Painful micturition, unspecified: Secondary | ICD-10-CM | POA: Diagnosis not present

## 2022-12-27 ENCOUNTER — Telehealth: Payer: Self-pay | Admitting: Family Medicine

## 2022-12-27 ENCOUNTER — Encounter: Payer: Self-pay | Admitting: Family Medicine

## 2022-12-27 ENCOUNTER — Ambulatory Visit (INDEPENDENT_AMBULATORY_CARE_PROVIDER_SITE_OTHER): Payer: Medicare Other | Admitting: Family Medicine

## 2022-12-27 VITALS — BP 150/62 | HR 56 | Temp 98.4°F | Ht 63.0 in | Wt 128.4 lb

## 2022-12-27 DIAGNOSIS — N3001 Acute cystitis with hematuria: Secondary | ICD-10-CM | POA: Diagnosis not present

## 2022-12-27 DIAGNOSIS — R300 Dysuria: Secondary | ICD-10-CM

## 2022-12-27 LAB — POCT URINALYSIS DIPSTICK
Bilirubin, UA: NEGATIVE
Blood, UA: NEGATIVE
Glucose, UA: NEGATIVE
Ketones, UA: NEGATIVE
Leukocytes, UA: NEGATIVE
Nitrite, UA: NEGATIVE
Protein, UA: NEGATIVE
Spec Grav, UA: 1.015 (ref 1.010–1.025)
Urobilinogen, UA: 0.2 E.U./dL
pH, UA: 7 (ref 5.0–8.0)

## 2022-12-27 MED ORDER — CEFADROXIL 500 MG PO CAPS
500.0000 mg | ORAL_CAPSULE | Freq: Two times a day (BID) | ORAL | 0 refills | Status: AC
Start: 2022-12-27 — End: 2023-01-03

## 2022-12-27 NOTE — Addendum Note (Signed)
Addended byEncarnacion Slates on: 12/27/2022 02:51 PM   Modules accepted: Orders

## 2022-12-27 NOTE — Patient Instructions (Signed)
We have not been able to get in touch with the urgent care regarding your urine  culture results.  We will change the antibiotic from doxycycline to Keflex.  You have tolerated Keflex in the past.  Once we get the information regarding the culture results if the antibiotic needs to be changed we can do so.  Stop taking doxycycline.  Continue drinking plenty of water and fluids.

## 2022-12-27 NOTE — Progress Notes (Signed)
   Established Patient Office Visit   Subjective  Patient ID: POCAHONTAS CRUS, female    DOB: Aug 27, 1935  Age: 88 y.o. MRN: [id number]  Chief Complaint  Patient presents with   Dysuria    Pt c/o dysuria started last Thursday. Went to UC on 12/24/2022. Pt is on doxycycline and diflucan on hand.     Patient is an 88 year old female with PMH SIG for HTN, chronic diastolic heart failure, anxiety,  followed by Dr. Elease Hashimoto and seen for acute concern.  Patient seen on 3/30 at [facility] for urinary concerns.  Given Rx for doxycycline 2/2 numerous medication intolerances, diflucan, and pyridium.  Pt states she has been taking Doxy BID, but did not take any today. Did not take the diflucan.  Still having dysuria.  States feels like a "knife in my vagina" after urinating.  Denies constipation is taking stool softener every other day.  Dysuria       Review of Systems  Genitourinary:  Positive for dysuria.   Negative unless stated above    Objective:     BP (!) 150/62 (BP Location: Left Arm, Patient Position: Sitting, Cuff Size: Normal)   Pulse (!) 56   Temp 98.4 F (36.9 C) (Oral)   Ht 5\' 3"  (1.6 m)   Wt 128 lb 6.4 oz (58.2 kg)   SpO2 95%   BMI 22.75 kg/m    Physical Exam Constitutional:      General: She is not in acute distress.    Appearance: Normal appearance.  HENT:     Head: Normocephalic and atraumatic.     Nose: Nose normal.     Mouth/Throat:     Mouth: Mucous membranes are moist.  Eyes:     Extraocular Movements: Extraocular movements intact.     Conjunctiva/sclera: Conjunctivae normal.  Cardiovascular:     Rate and Rhythm: Normal rate and regular rhythm.     Heart sounds: Murmur heard.     Systolic murmur is present with a grade of 3/6.     No gallop.  Pulmonary:     Effort: Pulmonary effort is normal. No respiratory distress.     Breath sounds: Normal breath sounds. No wheezing, rhonchi or rales.  Abdominal:     General: Bowel sounds are normal.     Palpations:  Abdomen is soft.     Tenderness: There is no abdominal tenderness. There is no right CVA tenderness or left CVA tenderness.  Skin:    General: Skin is warm and dry.  Neurological:     Mental Status: She is alert and oriented to person, place, and time.      No results found for any visits on 12/27/22.    Assessment & Plan:  Acute cystitis with hematuria -     Cefadroxil; Take 1 capsule (500 mg total) by mouth 2 (two) times daily for 7 days.  Dispense: 14 capsule; Refill: 0  Dysuria -     Cefadroxil; Take 1 capsule (500 mg total) by mouth 2 (two) times daily for 7 days.  Dispense: 14 capsule; Refill: 0  Patient with numerous medication intolerances rather than allergies.  D/c doxy as no improvement in UTI symptoms.  Start cefadroxil.  Attempted to contact UC x 3 for urine culture results without answer.  Obtain repeat UA this visit.  Further recommendations based on culture results.  Advised to take Diflucan as needed for yeast infection.  Return if symptoms worsen or fail to improve.   Billie Ruddy, MD

## 2022-12-27 NOTE — Telephone Encounter (Signed)
Pt calling to confirm that she is to take cefadroxil (DURICEF) 500 MG capsule  and not the keflex that was discussed during the appointment. Saw S. Banks today

## 2022-12-31 NOTE — Telephone Encounter (Signed)
yes

## 2023-01-03 ENCOUNTER — Other Ambulatory Visit: Payer: Self-pay | Admitting: Family Medicine

## 2023-01-03 DIAGNOSIS — B379 Candidiasis, unspecified: Secondary | ICD-10-CM

## 2023-01-03 MED ORDER — FLUCONAZOLE 150 MG PO TABS
150.0000 mg | ORAL_TABLET | Freq: Once | ORAL | 0 refills | Status: AC
Start: 2023-01-03 — End: 2023-01-03

## 2023-01-03 NOTE — Telephone Encounter (Signed)
Sounds like pt has a yeast infection from the antibiotics.  Will send in a prescription for Diflucan.  Patient does not need to take both antibiotics.  Ensure patient completed cefadroxil.  Does not need to start the Keflex sent by urgent care.

## 2023-01-06 NOTE — Telephone Encounter (Signed)
Spoke to pt. Inform pt of message below.  Pt reports she completed prescription  from Dr. Salomon Fick 2 days ago. Feeling better. Pt continued she had pick up Keflex and started 2 days ago. Advise pt can stopped taking keflex and take the diflucan for yeast infection. Verbalized understanding.

## 2023-01-10 ENCOUNTER — Ambulatory Visit: Payer: Medicare Other | Admitting: Family Medicine

## 2023-01-11 ENCOUNTER — Ambulatory Visit: Payer: Medicare Other | Admitting: Family Medicine

## 2023-01-19 ENCOUNTER — Ambulatory Visit (INDEPENDENT_AMBULATORY_CARE_PROVIDER_SITE_OTHER): Payer: Medicare Other

## 2023-01-19 DIAGNOSIS — Z7901 Long term (current) use of anticoagulants: Secondary | ICD-10-CM | POA: Diagnosis not present

## 2023-01-19 LAB — POCT INR: INR: 3.7 — AB (ref 2.0–3.0)

## 2023-01-19 NOTE — Progress Notes (Signed)
Pt was on keflex around  4/11 and then diflucan was taken on 4/21. Diflucan has a major interaction with warfarin. The diflucan is what caused the supra therapeutic INR. Pt was stable on current dose so no long term change in dosing will be made.  Hold dose today and the continue 1 tablet daily except take 1/2 tablet on Fridays. Recheck in 3 weeks.

## 2023-01-19 NOTE — Patient Instructions (Addendum)
Pre visit review using our clinic review tool, if applicable. No additional management support is needed unless otherwise documented below in the visit note.  Hold dose today and the continue 1 tablet daily except take 1/2 tablet on Fridays. Recheck in 3 weeks.

## 2023-02-02 DIAGNOSIS — H04123 Dry eye syndrome of bilateral lacrimal glands: Secondary | ICD-10-CM | POA: Diagnosis not present

## 2023-02-07 ENCOUNTER — Ambulatory Visit (INDEPENDENT_AMBULATORY_CARE_PROVIDER_SITE_OTHER): Payer: Medicare Other

## 2023-02-07 DIAGNOSIS — I4821 Permanent atrial fibrillation: Secondary | ICD-10-CM

## 2023-02-07 DIAGNOSIS — Z7901 Long term (current) use of anticoagulants: Secondary | ICD-10-CM | POA: Diagnosis not present

## 2023-02-07 DIAGNOSIS — I4891 Unspecified atrial fibrillation: Secondary | ICD-10-CM

## 2023-02-07 DIAGNOSIS — M25551 Pain in right hip: Secondary | ICD-10-CM | POA: Diagnosis not present

## 2023-02-07 DIAGNOSIS — M4316 Spondylolisthesis, lumbar region: Secondary | ICD-10-CM | POA: Diagnosis not present

## 2023-02-07 DIAGNOSIS — M545 Low back pain, unspecified: Secondary | ICD-10-CM | POA: Diagnosis not present

## 2023-02-07 DIAGNOSIS — M25552 Pain in left hip: Secondary | ICD-10-CM | POA: Diagnosis not present

## 2023-02-07 DIAGNOSIS — M48062 Spinal stenosis, lumbar region with neurogenic claudication: Secondary | ICD-10-CM | POA: Diagnosis not present

## 2023-02-07 LAB — POCT INR: INR: 2.9 (ref 2.0–3.0)

## 2023-02-07 MED ORDER — WARFARIN SODIUM 2 MG PO TABS
ORAL_TABLET | ORAL | 1 refills | Status: DC
Start: 2023-02-07 — End: 2023-07-06

## 2023-02-07 NOTE — Progress Notes (Signed)
Continue 1 tablet daily except take 1/2 tablet on Fridays. Recheck in 4 weeks.  

## 2023-02-07 NOTE — Progress Notes (Signed)
Pt is compliant with warfarin management and PCP apts.  Sent in refill of warfarin to requested pharmacy.      

## 2023-02-07 NOTE — Addendum Note (Signed)
Addended by: Sherrie George A on: 02/07/2023 01:46 PM   Modules accepted: Orders

## 2023-02-07 NOTE — Patient Instructions (Addendum)
Pre visit review using our clinic review tool, if applicable. No additional management support is needed unless otherwise documented below in the visit note.  Continue 1 tablet daily except take 1/2 tablet on Fridays. Recheck in 4 weeks.  

## 2023-02-11 ENCOUNTER — Ambulatory Visit (HOSPITAL_BASED_OUTPATIENT_CLINIC_OR_DEPARTMENT_OTHER): Payer: Medicare Other | Admitting: Family

## 2023-02-11 ENCOUNTER — Encounter (HOSPITAL_BASED_OUTPATIENT_CLINIC_OR_DEPARTMENT_OTHER): Payer: Self-pay | Admitting: Family

## 2023-02-11 VITALS — BP 138/70 | HR 66 | Ht 63.0 in | Wt 130.0 lb

## 2023-02-11 DIAGNOSIS — E782 Mixed hyperlipidemia: Secondary | ICD-10-CM

## 2023-02-11 DIAGNOSIS — I10 Essential (primary) hypertension: Secondary | ICD-10-CM | POA: Diagnosis not present

## 2023-02-11 DIAGNOSIS — I4811 Longstanding persistent atrial fibrillation: Secondary | ICD-10-CM

## 2023-02-11 DIAGNOSIS — I25118 Atherosclerotic heart disease of native coronary artery with other forms of angina pectoris: Secondary | ICD-10-CM

## 2023-02-11 DIAGNOSIS — I5032 Chronic diastolic (congestive) heart failure: Secondary | ICD-10-CM | POA: Diagnosis not present

## 2023-02-11 DIAGNOSIS — D6859 Other primary thrombophilia: Secondary | ICD-10-CM | POA: Diagnosis not present

## 2023-02-11 DIAGNOSIS — I350 Nonrheumatic aortic (valve) stenosis: Secondary | ICD-10-CM | POA: Diagnosis not present

## 2023-02-11 LAB — CBC
Hematocrit: 41.9 % (ref 34.0–46.6)
Hemoglobin: 13.8 g/dL (ref 11.1–15.9)
MCH: 29.8 pg (ref 26.6–33.0)
MCHC: 32.9 g/dL (ref 31.5–35.7)
MCV: 91 fL (ref 79–97)
Platelets: 239 10*3/uL (ref 150–450)
RBC: 4.63 x10E6/uL (ref 3.77–5.28)
RDW: 13.5 % (ref 11.7–15.4)
WBC: 9.1 10*3/uL (ref 3.4–10.8)

## 2023-02-11 LAB — BASIC METABOLIC PANEL
BUN/Creatinine Ratio: 20 (ref 12–28)
BUN: 18 mg/dL (ref 8–27)
CO2: 24 mmol/L (ref 20–29)
Calcium: 9.4 mg/dL (ref 8.7–10.3)
Chloride: 103 mmol/L (ref 96–106)
Creatinine, Ser: 0.9 mg/dL (ref 0.57–1.00)
Glucose: 87 mg/dL (ref 70–99)
Potassium: 4.5 mmol/L (ref 3.5–5.2)
Sodium: 143 mmol/L (ref 134–144)
eGFR: 63 mL/min/{1.73_m2} (ref >59)

## 2023-02-11 NOTE — Patient Instructions (Signed)
Medication Instructions:  Continue your current medications.   *If you need a refill on your cardiac medications before your next appointment, please call your pharmacy*   Lab Work: Your physician recommends that you return for lab work today: BMP, CBC  If you have labs (blood work) drawn today and your tests are completely normal, you will receive your results only by: MyChart Message (if you have MyChart) OR A paper copy in the mail If you have any lab test that is abnormal or we need to change your treatment, we will call you to review the results.   Follow-Up: At [facility], you and your health needs are our priority.  As part of our continuing mission to provide you with exceptional heart care, we have created designated Provider Care Teams.  These Care Teams include your primary Cardiologist (physician) and Advanced Practice Providers (APPs -  Physician Assistants and Nurse Practitioners) who all work together to provide you with the care you need, when you need it.  We recommend signing up for the patient portal called "MyChart".  Sign up information is provided on this After Visit Summary.  MyChart is used to connect with patients for Virtual Visits (Telemedicine).  Patients are able to view lab/test results, encounter notes, upcoming appointments, etc.  Non-urgent messages can be sent to your provider as well.   To learn more about what you can do with MyChart, go to [email or web address].    Your next appointment:   4-6 months with Dr. Duke Salvia or Alver Sorrow, NP   Other Instructions  Heart Healthy Diet Recommendations: A low-salt diet is recommended. Meats should be grilled, baked, or boiled. Avoid fried foods. Focus on lean protein sources like fish or chicken with vegetables and fruits. The American [employer] Association is a Chief Technology Officer!  American Heart Association Diet and Lifeystyle Recommendations   Exercise recommendations: The American Heart  Association recommends 150 minutes of moderate intensity exercise weekly. Try 30 minutes of moderate intensity exercise 4-5 times per week. This could include walking, jogging, or swimming.  Recommend weighing daily and keeping a log. Please call our office if you have weight gain of 2 pounds overnight or 5 pounds in 1 week.

## 2023-02-11 NOTE — Progress Notes (Signed)
Office Visit    Patient Name: Catherine [name] Date of Encounter: 02/11/2023  PCP:  Kristian Covey, MD   [facility]  Cardiologist:  Chilton Si, MD  Advanced Practice Provider:  No care team member to display Electrophysiologist:  None   Chief Complaint    Catherine [name] is a 88 y.o. female  presents today for heart failure follow up.   Past Medical History    Past Medical History:  Diagnosis Date   Acute on chronic diastolic heart failure (HCC)    AKI (acute kidney injury) (HCC) 06/22/2022   Aortic stenosis, mild 07/26/2017   Arthritis    Asthma    last attack 02/2015   Atrial fibrillation, chronic (HCC)    Cellulitis of left lower extremity 08/17/2016   Ulcer associated with severe venous insufficiency   Chronic anticoagulation    Chronic diastolic CHF (congestive heart failure) (HCC)    Chronic kidney disease    "RIGHT MANY KIDNEY INFECTIONS AND STONES"   COPD (chronic obstructive pulmonary disease) (HCC)    Coronary artery disease    Dizziness    H/O: rheumatic fever    Heart murmur    Hypertension    PONV (postoperative nausea and vomiting)    ' SOMETIMES', BUT NOT ALWAYS"   S/P Maze operation for atrial fibrillation 10/14/2016   Complete bilateral atrial lesion set using cryothermy and bipolar radiofrequency ablation - atrial appendage was not treated due to previous surgical procedure (open mitral commissurotomy)   S/P mitral valve replacement with bioprosthetic valve 10/14/2016   29 mm Medtronic Mosaic porcine bioprosthetic tissue valve   Tricuspid regurgitation 07/14/2021   UTI (urinary tract infection)    Valvular heart disease    Has mitral stenosis with prior mitral commissurotomy in 1970   Past Surgical History:  Procedure Laterality Date   ABDOMINAL HYSTERECTOMY  1983   endometriosis   APPENDECTOMY     BACK SURGERY     neurosurgery x2   CARDIAC CATHETERIZATION     CARDIAC CATHETERIZATION N/A 08/03/2016   Procedure:  Right/Left Heart Cath and Coronary Angiography;  Surgeon: Peter M Swaziland, MD;  Location: [facility] INVASIVE CV LAB;  Service: Cardiovascular;  Laterality: N/A;   cataract surg     CHOLECYSTECTOMY N/A 05/30/2015   Procedure: LAPAROSCOPIC CHOLECYSTECTOMY WITH INTRAOPERATIVE CHOLANGIOGRAM;  Surgeon: Glenna Fellows, MD;  Location: [facility] OR;  Service: General;  Laterality: N/A;   COLONOSCOPY     CORONARY ARTERY BYPASS GRAFT N/A 10/14/2016   Procedure: CORONARY ARTERY BYPASS GRAFTING (CABG);  Surgeon: Purcell Nails, MD;  Location: [facility] OR;  Service: Open Heart Surgery;  Laterality: N/A;   EYE SURGERY     MAZE N/A 10/14/2016   Procedure: MAZE;  Surgeon: Purcell Nails, MD;  Location: [facility] OR;  Service: Open Heart Surgery;  Laterality: N/A;   MITRAL VALVE REPLACEMENT N/A 10/14/2016   Procedure: REDO MITRAL VALVE REPLACEMENT (MVR);  Surgeon: Purcell Nails, MD;  Location: [facility] OR;  Service: Open Heart Surgery;  Laterality: N/A;   MITRAL VALVE SURGERY Left 1970   Open mitral commissurotomy via left thoracotomy approach   TEE WITHOUT CARDIOVERSION N/A 07/05/2016   Procedure: TRANSESOPHAGEAL ECHOCARDIOGRAM (TEE);  Surgeon: Chilton Si, MD;  Location: [facility] ENDOSCOPY;  Service: Cardiovascular;  Laterality: N/A;   TEE WITHOUT CARDIOVERSION N/A 10/14/2016   Procedure: TRANSESOPHAGEAL ECHOCARDIOGRAM (TEE);  Surgeon: Purcell Nails, MD;  Location: [facility] OR;  Service: Open Heart Surgery;  Laterality: N/A;   Allergies  Allergies  Allergen Reactions   Aldactone [Spironolactone] Other (See Comments)    dyspnea   Amoxicillin Palpitations    Tachycardia Has patient had a PCN reaction causing immediate rash, facial/tongue/throat swelling, SOB or lightheadedness with hypotension: no Has patient had a PCN reaction causing severe rash involving mucus membranes or skin necrosis: {no Has patient had a PCN reaction that required hospitalization {no Has patient had a PCN reaction occurring within the last 10 years: {yes If all of  the above answers are "NO", then may proceed with Cephalosporin use.   Diltiazem Other (See Comments)    Causing headaches    Flagyl [Metronidazole Hcl] Other (See Comments)    Causing headaches     Flovent [Fluticasone Propionate] Other (See Comments)    Leg cramps   Gabapentin Swelling   Lyrica [Pregabalin] Swelling   Quinidine Diarrhea and Other (See Comments)    Fever diarrhea   Simvastatin Other (See Comments)    Leg pain, myalgia   Tramadol Nausea Only   Verapamil Other (See Comments)    myalgias   Amlodipine     Low extremity edema   Crestor [Rosuvastatin]     myalgia   Livalo [Pitavastatin]     myalgias   Polymyxin B-Trimethoprim Swelling   Zetia [Ezetimibe]     LEG CRAMPS    Benazepril Hcl Cough   Ciprofloxacin Diarrhea   Codeine Nausea Only   Nitrofurantoin Monohyd Macro Nausea Only    History of Present Illness    Catherine [name] is a 88 y.o. 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  History of rheumatic MV disease and underwent mitral commissurotomy in the 1970s. 09/2017 she underwent bioprosthetic MVR, CABGx1, and bilateral MAZE procedure with Dr. Cornelius Moras.  Echo 09/2019 LVEF 65-70%, mild LVH, no RWMA, bioprosthetic mitral valve functioning appropriately, mild AS, mild pulmonic regurgitation.   She was seen 11/2020 by Dr. Duke Salvia with improvement in abdominal bloating after transition from Lasix to Torsemide. Her persistent LE edema was thought to be due to venous insufficiency. Unable to tolerate compression socks and declined referral to [facility].  Hospitalized 10/2021 with COPD exacerbation and HFpEF.  Echo 10/2071 LVEF 60 to 65%, RV mildly reduced, RVSP 58 mmHg, moderate BAE, normal-appearing bioprosthetic MVR, moderate TR.  She was admitted 01/05/22  for acute on chronic diastolic heart failure with acute hypoxic respiratory failure and COPD exacerbation.   She developed bradycardia with heart rate in the 40s and pauses up to 2.4 seconds while on metoprolol XL 50 mg daily.  It was reduced to 37.5 mg daily.  Amiodarone was considered to reduce risk of intermittent RVR versus pacemaker for tachybradycardia syndrome.  She was trialed on amiodarone but did not tolerate.  At clinic 03/04/22 her Losartan and Torsemide were stopped. She was started on Entresto and Bumex. She was referred to prep exercise program at the [facility] but declined to participate. At follow up 03/2022 Metolazone added once per week for volume control.   Seen 06/2022 by Dr. Duke Salvia. Volume status had been labile at home. Bumex adjusted to 2mg  daily AM and 2mg  in the afternoon on M, W, F. Metolazone once per week was continued. Jardiance/Farxiga deferred due to frequent UTI. Subsequent labs 08/04/22 with creatinine 1.78 and Metolazone was recommended to reduce to weekly PRN.   Last seen  10/11/22 recommended for updated order echocardiogram 10/2022 for monitoring of mild AS.  She was taking only Bumex 2 mg in the morning not afternoon dose.  Based on subsequent lab work she was recommended for Bumex 2 mg in the morning and extra tablet in the afternoon on Mondays, Wednesdays, Fridays.  She was also recommended to use metolazone only as needed once per week for a week and 2 pounds overnight or 5 pounds in 1 week.  Echo 2/7/ 24 normal LVEF 60 to 65%, RV moderately reduced function, mild elevated PASP, MV prosthesis functioning appropriately, mild aortic stenosis with mean gradient 11 mmHg. When seen in clinic for echocardiogram she was bradycardic and metoprolol was reduced to 25 mg daily.  It was later further reduced to 12.5 mg daily due to bradycardia.  Presents today for follow up with her husband.  Weight down 4 pounds from clinic visit 4 months ago. Notes still having back pain related to  two prior back surgeries which is limiting her activity - she is using Hydrocodone PRN. The provider following this has offered more injections which she is considering. SBP at home 120-130s and HR in the 50s. Stable mild exertional dyspnea with more than usual activity but no orthopnea, PND, edema.    EKGs/Labs/Other Studies Reviewed:   The following studies were reviewed today:  Cardiac Studies & Procedures   CARDIAC CATHETERIZATION  CARDIAC CATHETERIZATION 08/03/2016  Narrative  The left ventricular systolic function is normal.  LV end diastolic pressure is normal.  The left ventricular ejection fraction is 55-65% by visual estimate.  There is no aortic valve stenosis.  There is severe mitral valve stenosis.  Prox Cx to Mid Cx lesion, 50 %stenosed.  Mid RCA-2 lesion, 80 %stenosed.  Mid RCA-1 lesion, 80 %stenosed.  Hemodynamic findings consistent with mild pulmonary hypertension.  LV end diastolic pressure is normal.  1. Coronary artery disease - 50% mid LCx - 80% sequential lesions in the mid RCA 2. Normal LV function 3. Severe mitral stenosis. MV gradient of 13 mm Hg. MVA 0.99 cm squared with index 0.57. 4. Moderate to severe mitral insufficiency 5. Mild pulmonary HTN. 6. Normal LV EDP 7. No significant AV gradient 8. Occluded right brachial artery.  Plan: surgical evaluation for MVR and CABG.  Findings Coronary Findings Diagnostic  Dominance: Right  Left Main Vessel was injected. Vessel is normal in caliber. Vessel is angiographically normal.  Left Anterior Descending Vessel was injected. Vessel is normal in caliber. Vessel is angiographically normal.  Left Circumflex  Right Coronary Artery  Intervention  No interventions have been documented.   STRESS TESTS  ECHOCARDIOGRAM STRESS TEST 07/29/2016  Narrative *[facility]* [address] [place], [place]  [address] [phone]  ------------------------------------------------------------------- Stress Echocardiography  Patient:    Catherine [name], Catherine [name] MR #:       [id number] Study Date: 07/27/2016 Gender:     F Age:        19 Height:     162.6 cm Weight:     65.5 kg BSA:        1.73 m^2 Pt. Status: Room:  SONOGRAPHER  Junious Dresser, RDCS PERFORMING   [facility], Outpatient ATTENDING    Chilton Si, MD ORDERING     Chilton Si, MD REFERRING    Chilton Si, MD  cc:  -------------------------------------------------------------------  ------------------------------------------------------------------- Indications:      (R06.02).  (I34.0). Assess Mitral valve (stenosis and regurgitation)  ------------------------------------------------------------------- History:   PMH:  Acquired from the patient and  from the patient&'s chart.  Dyspnea and murmur.  Persistent atrial fibrillation. Moderate mitral regurgitation.  Borderline significant mitral stenosis.  Chronic obstructive pulmonary disease.  Rheumatic fever. Risk factors:  Hypertension. Dyslipidemia.  ------------------------------------------------------------------- Study Conclusions  - Mitral valve: Mitral stenosis at rest - mean gradient (mild to moderate) Mitral stenosis at stress (exericse) - mean gradient . (Severe)  Mitral regugitation appears severe at both rest and stress. (PISA radius 0.8cm at rest, 1.2cm at stress). Severely dilated left atrium. - Stress ECG conclusions: Atrial fibrillation at baseline and stress. There was 2 mm hortizontal/ downsloping ST depression at stress in several leads with elevation in aVR. - Staged echo: EF 65% at baseline and 75% with exercise. Study not able to determine echocardiographic evidence of ischemia due to lack of standard protocol.  Impressions:  - Abnormal ETT portion of study - poor exercise tolerance ( ), ST depression 2 mm diffusely with aVR  elevation concerning for global ischemia. EF does improve during exercise. Study not technically sufficient to assess wall motion abnormalities in all segments.  Severe mitral regurgitation with severe mitral stenosis at stress. See above for details.  ------------------------------------------------------------------- Labs, prior tests, procedures, and surgery: Transesophageal echocardiography (07/05/2016).    The mitral valve showed moderate stenosis and moderate regurgitation. Mitral valve: mean gradient of 5 mm Hg.  Catheterization with cardiac intervention (1970).    Mitral commissurotomy was performed.  ------------------------------------------------------------------- Study data:  Images are not labeled pre and post, sorry. Images 1-29 are pre exercise pictures. Images 30 to the end are post exercise pictures.  Study status:  Routine.  Consent:  The risks, benefits, and alternatives to the procedure were explained to the patient and informed consent was obtained.  Procedure:  The patient reported no pain pre or post test. Initial setup. The patient was brought to the laboratory. A baseline ECG was recorded. Surface ECG leads and automatic cuff blood pressure measurements were monitored. Treadmill exercise testing was performed using the Bruce protocol. The patient exercised for 3 min, to protocol stage 1, to a maximal work rate of 4.6 mets. Exercise was terminated due to fatigue. The patient was positioned for image acquisition and recovery monitoring. Transthoracic stress echocardiography for assessment of valvular function and Unable to do ischemia assess due to atrial fibrillation. Image quality was adequate. Images were captured at baseline and peak exercise.  Study completion:  The patient tolerated the procedure well. There were no complications. Bruce protocol. Stress echocardiography.  Birthdate: Patient birthdate: 1935-07-14.  Age:  Patient is 88 yr old.   Sex: Gender: female.    BMI: 24.8 kg/m^2.  Blood pressure:     142/77 Patient status:  Outpatient.  Study date:  Study date: 07/27/2016. Study time: 07:38 AM.  -------------------------------------------------------------------  ------------------------------------------------------------------- Mitral valve:  Mitral stenosis at rest - mean gradient (mild to moderate) Mitral stenosis at stress (exericse) - mean gradient . (Severe)  Mitral regugitation appears severe at both rest and stress. (PISA radius 0.8cm at rest, 1.2cm at stress). Severely dilated left atrium. Pre exercise mitral valve measurements Mitral mean velocity, D Mitral mean gradient, D 6 mm Hg Mitral annulus VTI, D 49 cm Mitral regurg, VTI, PISA 195 cm Mitral ERO, PISA 0.28 cm2 Mitral regurg volume, PISA 55 ml  Doppler:     Mean gradient (D): 14 mm Hg.  ------------------------------------------------------------------- Stress protocol:  +---------------------+---+-----------+---------------+ !Stage                !HR !BP (mmHg)  !Symptoms       ! +---------------------+---+-----------+---------------+ !  Baseline             !59 !142/77 (99)!None           ! +---------------------+---+-----------+---------------+ !Stage 1              !117!52/33 (39) !Fatigue, RPE 17! +---------------------+---+-----------+---------------+ !Immediate post stress!108!-----------!None           ! +---------------------+---+-----------+---------------+ !Recovery; 1 min      !96 !92/46 (61) !None           ! +---------------------+---+-----------+---------------+ !Recovery; 2 min      !71 !-----------!None           ! +---------------------+---+-----------+---------------+ !Recovery; 3 min      !68 !-----------!None           ! +---------------------+---+-----------+---------------+ !Recovery; 4 min      !69 !126/73 (91)!None           ! +---------------------+---+-----------+---------------+ !Recovery; 5 min       !64 !-----------!None           ! +---------------------+---+-----------+---------------+ !Late recovery        !65 !117/69 (85)!None           ! +---------------------+---+-----------+---------------+  ------------------------------------------------------------------- Stress results:   Maximal heart rate during stress was 117 bpm (83% of maximal predicted heart rate). The maximal predicted heart rate was 141 bpm.The target heart rate was achieved. The heart rate response to stress was normal. There was a normal resting blood pressure with an appropriate response to stress. The rate-pressure product for the peak heart rate and blood pressure was 8832 mm Hg/min.  The patient experienced no chest pain during stress.  ------------------------------------------------------------------- Stress ECG:  Atrial fibrillation at baseline and stress. There was 2 mm hortizontal/ downsloping ST depression at stress in several leads with elevation in aVR.  ------------------------------------------------------------------- Baseline:  Peak stress:  ------------------------------------------------------------------- Stress echo results:     EF 65% at baseline and 75% with exercise. Study not able to determine echocardiographic evidence of ischemia due to lack of standard protocol. Left ventricular ejection fraction was normal at rest and with stress.  ------------------------------------------------------------------- Measurements  Left atrium                     Value LA volume, S                    145   ml LA volume/bsa, S                83.8  ml/m^2 LA volume, ES, 1-p A4C          122   ml LA volume/bsa, ES, 1-p A4C      70.5  ml/m^2 LA volume, ES, 1-p A2C          157   ml LA volume/bsa, ES, 1-p A2C      90.7  ml/m^2  Mitral valve                    Value Mitral mean velocity, D         180   cm/s Mitral mean gradient, D         14    mm Hg Mitral annulus VTI, D           37     cm Mitral regurg VTI, PISA         156   cm Mitral ERO, PISA  0.48  cm^2 Mitral regurg volume, PISA      75    ml  Legend: (L)  and  (H)  mark values outside specified reference range.  ------------------------------------------------------------------- Prepared and Electronically Authenticated by  Donato Schultz, M.D. 2017-11-01T11:32:32   ECHOCARDIOGRAM  ECHOCARDIOGRAM COMPLETE 11/03/2022  Narrative ECHOCARDIOGRAM REPORT    Patient Name:   Catherine [name] Date of Exam: 11/03/2022 Medical Rec #:  [id number]      Height:       63.0 in Accession #:    [id number]     Weight:       130.4 lb Date of Birth:  06-06-1935     BSA:          1.612 m Patient Age:    87 years       BP:           120/60 mmHg Patient Gender: F              HR:           42 bpm. Exam Location:  Outpatient  Procedure: 2D Echo, 3D Echo, Cardiac Doppler, Color Doppler and Strain Analysis  Indications:    Aortic Stenosis  History:        Patient has prior history of Echocardiogram examinations, most recent 10/30/2021. CHF, Prior CABG, COPD, Arrythmias:Atrial Fibrillation; Risk Factors:Former Smoker and Hypertension. S/P Maze operation for atrial fibrillation; Mitral valve ; Status post Mitral Valve Replacement-29 mm Medtronic Mosaic porcine bioprosthetic.  Mitral Valve: 29 mm Medtronic bioprosthetic valve valve is present in the mitral position.  Sonographer:    Jeryl Columbia RDCS Referring Phys: [id number] Uriah Trueba S Martia Dalby  IMPRESSIONS   1. Left ventricular ejection fraction, by estimation, is 60 to 65%. The left ventricle has normal function. The left ventricle has no regional wall motion abnormalities. Left ventricular diastolic function could not be evaluated. 2. Right ventricular systolic function is moderately reduced. The right ventricular size is mildly enlarged. There is mildly elevated pulmonary artery systolic pressure. 3. Left atrial size was severely dilated. 4. Right atrial size  was moderately dilated. 5. The mitral valve has been repaired/replaced. No evidence of mitral valve regurgitation. The mean mitral valve gradient is 4.0 mmHg. There is a 29 mm Medtronic bioprosthetic valve present in the mitral position. Echo findings are consistent with normal structure and function of the mitral valve prosthesis. 6. Tricuspid valve regurgitation is moderate. 7. The aortic valve is tricuspid. There is moderate calcification of the aortic valve. There is moderate thickening of the aortic valve. Aortic valve regurgitation is not visualized. Mild aortic valve stenosis. Aortic valve area, by VTI measures 2.20 cm. Aortic valve mean gradient measures 11.0 mmHg. Aortic valve Vmax measures 2.23 m/s. 8. The inferior vena cava is dilated in size with >50% respiratory variability, suggesting right atrial pressure of 8 mmHg. 9. Cannot exclude a small PFO.  Comparison(s): No significant change from prior study.  Conclusion(s)/Recommendation(s): Bradycardic into the 40s during study.  FINDINGS Left Ventricle: Left ventricular ejection fraction, by estimation, is 60 to 65%. The left ventricle has normal function. The left ventricle has no regional wall motion abnormalities. The left ventricular internal cavity size was normal in size. There is borderline left ventricular hypertrophy. Left ventricular diastolic function could not be evaluated due to mitral valve replacement. Left ventricular diastolic function could not be evaluated.  Right Ventricle: The right ventricular size is mildly enlarged. Right vetricular wall thickness was not well visualized. Right ventricular systolic function is moderately reduced.  There is mildly elevated pulmonary artery systolic pressure. The tricuspid regurgitant velocity is 2.75 m/s, and with an assumed right atrial pressure of 8 mmHg, the estimated right ventricular systolic pressure is 38.2 mmHg.  Left Atrium: Left atrial size was severely dilated.  Right  Atrium: Right atrial size was moderately dilated.  Pericardium: There is no evidence of pericardial effusion.  Mitral Valve: Mobile structures in LV most consistent with ruptured chordae/papillary muscle. Struts of MVR extend into LV cavity/LVOT without significant obstruction by gradients/doppler. The mitral valve has been repaired/replaced. No evidence of mitral valve regurgitation. There is a 29 mm Medtronic bioprosthetic valve present in the mitral position. Echo findings are consistent with normal structure and function of the mitral valve prosthesis. MV peak gradient, 14.4 mmHg. The mean mitral valve gradient is 4.0 mmHg.  Tricuspid Valve: The tricuspid valve is normal in structure. Tricuspid valve regurgitation is moderate . No evidence of tricuspid stenosis.  Aortic Valve: The aortic valve is tricuspid. There is moderate calcification of the aortic valve. There is moderate thickening of the aortic valve. Aortic valve regurgitation is not visualized. Mild aortic stenosis is present. Aortic valve mean gradient measures 11.0 mmHg. Aortic valve peak gradient measures 19.9 mmHg. Aortic valve area, by VTI measures 2.20 cm.  Pulmonic Valve: The pulmonic valve was grossly normal. Pulmonic valve regurgitation is trivial. No evidence of pulmonic stenosis.  Aorta: The aortic root, ascending aorta, aortic arch and descending aorta are all structurally normal, with no evidence of dilitation or obstruction.  Venous: The inferior vena cava is dilated in size with greater than 50% respiratory variability, suggesting right atrial pressure of 8 mmHg.  IAS/Shunts: Cannot exclude a small PFO.   LEFT VENTRICLE PLAX 2D LVIDd:         4.43 cm   Diastology LVIDs:         2.85 cm   LV e' medial:    3.50 cm/s LV PW:         1.29 cm   LV E/e' medial:  47.7 LV IVS:        0.93 cm   LV e' lateral:   6.53 cm/s LVOT diam:     1.70 cm   LV E/e' lateral: 25.6 LV SV:         132 LV SV Index:   82 LVOT Area:      2.27 cm  3D Volume EF: 3D EF:        64 % LV EDV:       110 ml LV ESV:       40 ml LV SV:        70 ml  RIGHT VENTRICLE RV Basal diam:  3.99 cm RV Mid diam:    2.62 cm RV S prime:     8.66 cm/s TAPSE (M-mode): 0.7 cm  LEFT ATRIUM              Index        RIGHT ATRIUM           Index LA diam:        4.90 cm  3.04 cm/m   RA Area:     18.00 cm LA Vol (A2C):   95.9 ml  59.48 ml/m  RA Volume:   48.90 ml  30.33 ml/m LA Vol (A4C):   105.0 ml 65.13 ml/m LA Biplane Vol: 103.0 ml 63.89 ml/m AORTIC VALVE AV Area (Vmax):    2.33 cm AV Area (Vmean):   2.22 cm AV  Area (VTI):     2.20 cm AV Vmax:           223.00 cm/s AV Vmean:          149.000 cm/s AV VTI:            0.600 m AV Peak Grad:      19.9 mmHg AV Mean Grad:      11.0 mmHg LVOT Vmax:         229.00 cm/s LVOT Vmean:        146.000 cm/s LVOT VTI:          0.582 m LVOT/AV VTI ratio: 0.97  AORTA Ao Root diam: 2.40 cm  MITRAL VALVE                TRICUSPID VALVE MV Area (PHT): 1.84 cm     TR Peak grad:   30.2 mmHg MV Area VTI:   2.10 cm     TR Vmax:        275.00 cm/s MV Peak grad:  14.4 mmHg MV Mean grad:  4.0 mmHg     SHUNTS MV Vmax:       1.90 m/s     Systemic VTI:  0.58 m MV Vmean:      85.3 cm/s    Systemic Diam: 1.70 cm MV Decel Time: 412 msec MV E velocity: 167.00 cm/s  Jodelle Red MD Electronically signed by Jodelle Red MD Signature Date/Time: 11/03/2022/2:28:21 PM    Final   TEE  ECHO TEE 10/18/2016  Interpretation Summary  Left atrium: Cavity is dilated and dilated.  Right atrium: Cavity is dilated.  Tricuspid valve: Mild regurgitation. The tricuspid valve regurgitation jet is central.  Mitral valve: Suggestive of rheumatic valve disease with hockey stick appearance of anterior leaflet and dilated mitral annulus. Severe leaflet thickening is present. Moderate leaflet calcification is present. Mild mitral annular calcification. Mild stenosis. Abnormal anterior mitral and  abnormal posterior mitral leaflet mobility. Moderate to severe regurgitation.  Aortic valve: The valve is trileaflet. Mild valve thickening present. No stenosis. No regurgitation. 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  Pulmonic valve: Trace regurgitation.  Right ventricle: Normal cavity size, wall thickness and ejection fraction.  Aorta: The ascending aorta is mildly dilated.  Left ventricle: Normal cavity size. Concentric hypertrophy of mild severity. LV systolic function is normal with an EF of 60-65%. There are no obvious wall motion abnormalities.  Mitral valve: There is a bioprosthetic mitral valve.   MONITORS  CARDIAC EVENT MONITOR 07/26/2017  Narrative 7 Day Event Monitor  Quality: Fair.  Baseline artifact. Predominant rhythm: Sinus rhythm with PACs and sinus arrhythmia.  Cannot  rule out episodes of atrial fibrillation.   Tiffany C. Duke Salvia, MD, [facility] 07/15/2017 2:15 PM            EKG:  EKG is  not ordered today.   Recent Labs: 04/19/2022: Magnesium 1.7 06/14/2022: ALT 13 07/30/2022: Hemoglobin 12.5; Platelets 215 10/11/2022: BNP 181.8; BUN 12; Creatinine, Ser 1.06; Potassium 4.2; Sodium 143  Recent Lipid Panel    Component Value Date/Time   CHOL 297 (H) 06/14/2022 1032   TRIG 295 (H) 06/14/2022 1032   HDL 44 06/14/2022 1032   CHOLHDL 6.8 (H) 06/14/2022 1032   CHOLHDL 2.8 07/09/2020 1403   VLDL 42.8 (H) 09/24/2019 1054   LDLCALC 195 (H) 06/14/2022 1032   LDLCALC 77 07/09/2020 1403   LDLDIRECT 68.0 09/24/2019 1054  Risk Assessment/Calculations:    CHA2DS2-VASc Score = 6   This indicates a 9.7% annual risk of stroke. The patient's score is based upon: CHF History: 1 HTN History: 1 Diabetes History: 0 Stroke History: 0 Vascular Disease History: 1 Age Score: 2 Gender Score: 1      Home Medications   Current Meds  Medication Sig   acetaminophen (TYLENOL)  500 MG tablet Take 1,000 mg by mouth every 6 (six) hours as needed for mild pain.   albuterol (VENTOLIN HFA) 108 (90 Base) MCG/ACT inhaler INHALE 2 PUFFS INTO THE LUNGS EVERY 4 HOURS AS NEEDED FOR AHEEZING OR SHORTNESS OF BREATH   bumetanide (BUMEX) 2 MG tablet Please take 2mg  daily in the morning and then 2mg  in the afternoon on MONDAY, [facility] AND FRIDAY   cholecalciferol (VITAMIN D3) 25 MCG (1000 UNIT) tablet Take 1,000 Units by mouth daily.   diazepam (VALIUM) 5 MG tablet TAKE ONE TABLET AT BEDTIME   estradiol (ESTRACE VAGINAL) 0.1 MG/GM vaginal cream USE 2 grams per vagina daily for 2 weeks and then 1 gram per vagina three times per week. (Patient taking differently: USE 1 gram per vagina three times per week PRN)   fluticasone (FLONASE) 50 MCG/ACT nasal spray Place 1 spray into both nostrils daily as needed for allergies or rhinitis.   HYDROcodone-acetaminophen (NORCO/VICODIN) 5-325 MG tablet TAKE ONE TABLET EVERY 8 HOURS   metolazone (ZAROXOLYN) 5 MG tablet TAKE ONE TABLET AS NEEDED ONCE A WEEK 30 MINUTES BEFORE BUMEX DOSE. AS NEEDED ONLY FOR WEIGHT GAIN 2LBS OVERNIGHT OR 5 LBS IN ONE WEEK   metoprolol succinate (TOPROL-XL) 25 MG 24 hr tablet Take 1/2 tablet daily   Multiple Vitamins-Minerals (HAIR/SKIN/NAILS/BIOTIN PO) Take 1 tablet by mouth daily.   Multiple Vitamins-Minerals (MULTIVITAMIN ADULT EXTRA C) CHEW Chew 2 tablets by mouth daily.   potassium chloride (KLOR-CON) 10 MEQ tablet TAKE UP TO 8 TABLETS DAILY AS DIRECTED   sacubitril-valsartan (ENTRESTO) 24-26 MG Take 1 tablet by mouth 2 (two) times daily.   trimethoprim-polymyxin b (POLYTRIM) ophthalmic solution Place 2 drops into both eyes every 4 (four) hours.   warfarin (COUMADIN) 2 MG tablet TAKE 1 TABLET BY MOUTH DAILY EXCEPT TAKE 1/2 TABLET ON FRIDAYS OR AS DIRECTED BY ANTICOAGULATION CLINIC     Review of Systems      All other systems reviewed and are otherwise negative except as noted above.  Physical Exam    VS:  BP  138/70   Pulse 66   Ht 5\' 3"  (1.6 m)   Wt 130 lb (59 kg)   BMI 23.03 kg/m  , BMI Body mass index is 23.03 kg/m.  Wt Readings from Last 3 Encounters:  02/11/23 130 lb (59 kg)  12/27/22 128 lb 6.4 oz (58.2 kg)  12/17/22 132 lb (59.9 kg)    GEN: Well nourished, well developed, in no acute distress. HEENT: normal. Neck: Supple, no JVD, carotid bruits, or masses. Cardiac: RRR, no  rubs, or gallops. Gr 2/6 systolic murmur. No clubbing, cyanosis, edema. Radials/PT 2+ and equal bilaterally.  Respiratory:  Respirations regular and unlabored, clear to auscultation bilaterally. GI: Soft, nontender, nondistended. MS: No deformity or atrophy. Skin: Warm and dry, no rash. LE with bilateral varicose veins.  Neuro:  Strength and sensation are intact. Psych: Normal affect.  Assessment & Plan    CAD s/p CABG - Stable with no anginal symptoms. No indication for ischemic evaluation.  GDMT includes statin, beta-blocker.  No aspirin due to chronic anticoagulation. Heart healthy diet  and regular cardiovascular exercise encouraged.    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 Echo 10/30/21 and 10/2022 normal appearing bioprosthetic MVR with no PVL. Update echo 10/2023 for monitoring already ordered.  Chronic anticoagulation / Chronic atrial fibrillation - Rate controlled. Previously intolerant of amiodarone. Continue Metoprolol 12.5mg  QD.  Dose previously reduced due to bradycardia.  Continue warfarin. CHA2DS2-VASc Score = 6 [CHF History: 1, HTN History: 1, Diabetes History: 0, Stroke History: 0, Vascular Disease History: 1, Age Score: 2, Gender Score: 1].  Therefore, the patient's annual risk of stroke is 9.7 %. Follows with coumadin clinic at her PCP. Denies bleeding complications. Update CBC.   Chronic diastolic heart failure - GDMT Entresto, Bumex, Metoprolol, PRN Metolazone.Defer SGLT2i due to frequent UTI.  Prior intolerance to Spironolactone. Low sodium  diet, fluid restriction <2L, and daily weights encouraged. Educated to contact our office for weight gain of 2 lbs overnight or 5 lbs in one week. BMP today. She is euvolemic on exam. Continue Metolazone 2.5mg  PRN weekly. She is taking Bumex 2mg  QAM and additional tablet in the afternoon M, W, F.   Aortic stenosis - Mild AS by echo 10/2022 mean gradient  Continue optimal BP and volume control. Update echocardiogram 10/2023 already ordered.   Carotid artery stenosis - 02/2021 bilateral 1-39% stenosis. Lipid management, as below. No aspirin due to chronic anticoagulation. Repeat imaging as clinically indicated.  HTN -BP well controlled by home monitoring. . Continue current antihypertensive regimen.  She will continue to monitor at home.   HLD - Previously did not tolerate Rosuvastatin, Simvastatin, Pitavastatin. Managed with diet and exercise.   Disposition: Follow up in 4-6 months with Dr. Duke Salvia  Signed, Alver Sorrow, NP 02/11/2023, 10:19 AM [facility]

## 2023-02-14 ENCOUNTER — Telehealth (HOSPITAL_BASED_OUTPATIENT_CLINIC_OR_DEPARTMENT_OTHER): Payer: Self-pay

## 2023-02-14 NOTE — Telephone Encounter (Addendum)
Results called to patient who verbalizes understanding!   ----- Message from Alver Sorrow, NP sent at 02/14/2023  7:56 AM EDT ----- CBC with no evidence of anemia nor infection.  Stable kidney function. Normal electrolytes. Good result!

## 2023-02-22 ENCOUNTER — Ambulatory Visit (HOSPITAL_BASED_OUTPATIENT_CLINIC_OR_DEPARTMENT_OTHER): Payer: Medicare Other | Admitting: Pulmonary Disease

## 2023-02-22 ENCOUNTER — Encounter (HOSPITAL_BASED_OUTPATIENT_CLINIC_OR_DEPARTMENT_OTHER): Payer: Self-pay | Admitting: Pulmonary Disease

## 2023-02-22 VITALS — BP 132/70 | HR 64 | Temp 98.2°F | Ht 63.0 in | Wt 126.2 lb

## 2023-02-22 DIAGNOSIS — J4489 Other specified chronic obstructive pulmonary disease: Secondary | ICD-10-CM | POA: Diagnosis not present

## 2023-02-22 NOTE — Progress Notes (Signed)
[facility]  Chief Complaint  Patient presents with   Follow-up    Follow up. Patient has no complaints.     Past Surgical History:  She  has a past surgical history that includes Cardiac catheterization; cataract surg; Abdominal hysterectomy (1983); Eye surgery; Cholecystectomy (N/A, 05/30/2015); TEE without cardioversion (N/A, 07/05/2016); Cardiac catheterization (N/A, 08/03/2016); Mitral valve surgery (Left, 1970); Back surgery; Appendectomy; Colonoscopy; Mitral valve replacement (N/A, 10/14/2016); Coronary artery bypass graft (N/A, 10/14/2016); MAZE (N/A, 10/14/2016); and TEE without cardioversion (N/A, 10/14/2016).  Past Medical History:  Diastolic CHF, Rheumatic fever with valvular heart disease s/p MVR, Atrial fibrillation s/p MAZE, CKD, Nephrolithiasis, CAD, HTN, COVID 19 infection November 2020  Constitutional:  BP 132/70 (BP Location: Left Arm, Patient Position: Sitting, Cuff Size: Normal)   Pulse 64   Temp 98.2 F (36.8 C) (Oral)   Ht 5\' 3"  (1.6 m)   Wt 126 lb 3.2 oz (57.2 kg)   SpO2 94%   BMI 22.36 kg/m   Brief Summary:  Catherine [name] is a 88 y.o. female former smoker with COPD and asthma.      Subjective:   She gets episodes of leg swelling when she eats more salty foods.  She will feel more short of breath and then her heart starts racing.  She takes metolazone and this helps her swelling.  Her breathing gets better and palpitations get better.  She is not having cough, wheeze, or chest congestion.  Sinuses are okay.  Physical Exam:   Appearance - well kempt   ENMT - no sinus tenderness, no oral exudate, no LAN, Mallampati 2 airway, no stridor  Respiratory - equal breath sounds bilaterally, no wheezing or rales  CV - s1s2 regular rate and rhythm, no murmurs  Ext - no clubbing, no edema  Skin - no rashes  Psych - normal mood and affect     Pulmonary testing:  PFT 10/12/16 >> FEV1 1.34 (71%), FEV1% 70, TLC 5.00  (101%), DLCO 54%  Chest Imaging:  CT angio chest 10/29/21 >> ATX RML, small effusions  Cardiac Tests:  Echo 11/03/22 >> EF 60 to 65%, mild elevation in PASP, severe LA and mod RA dilation, s/p MVR, mod TR  Social History:  She  reports that she quit smoking about 47 years ago. Her smoking use included cigarettes. She has a 15.00 pack-year smoking history. She has never used smokeless tobacco. She reports that she does not drink alcohol and does not use drugs.  Family History:  Her family history includes Leukemia in her father.     Assessment/Plan:   COPD with asthma. - bevespi caused leg cramps and urine retention - prn albuterol; she will call in January 2025 for a refill  Allergic rhinitis. - likely cause of her current symptoms - prn flonase, [place]  Chronic atrial fibrillation, Rheumatic valvular heart disease s/p MVR, CAD s/p CABG. - followed by Dr. Chilton Si with cardiology - discussed importance of monitoring her sodium intake  Time Spent Involved in Patient Care on Day of Examination:  26 minutes  Follow up:   Patient Instructions  Follow up in 1 year  Medication List:   Allergies as of 02/22/2023       Reactions   Aldactone [spironolactone] Other (See Comments)   dyspnea   Amoxicillin Palpitations   Tachycardia Has patient had a PCN reaction causing immediate rash, facial/tongue/throat swelling, SOB or lightheadedness with hypotension: no Has patient had a PCN reaction causing severe  rash involving mucus membranes or skin necrosis: {no Has patient had a PCN reaction that required hospitalization {no Has patient had a PCN reaction occurring within the last 10 years: {yes If all of the above answers are "NO", then may proceed with Cephalosporin use.   Diltiazem Other (See Comments)   Causing headaches   Flagyl [metronidazole Hcl] Other (See Comments)   Causing headaches   Flovent [fluticasone Propionate] Other (See Comments)   Leg cramps   Gabapentin  Swelling   Lyrica [pregabalin] Swelling   Quinidine Diarrhea, Other (See Comments)   Fever diarrhea   Simvastatin Other (See Comments)   Leg pain, myalgia   Tramadol Nausea Only   Verapamil Other (See Comments)   myalgias   Amlodipine    Low extremity edema   Crestor [rosuvastatin]    myalgia   Livalo [pitavastatin]    myalgias   Polymyxin B-trimethoprim Swelling   Zetia [ezetimibe]    LEG CRAMPS   Benazepril Hcl Cough   Ciprofloxacin Diarrhea   Codeine Nausea Only   Nitrofurantoin Monohyd Macro Nausea Only        Medication List        Accurate as of Feb 22, 2023 11:15 AM. If you have any questions, ask your nurse or doctor.          acetaminophen 500 MG tablet Commonly known as: TYLENOL Take 1,000 mg by mouth every 6 (six) hours as needed for mild pain.   albuterol 108 (90 Base) MCG/ACT inhaler Commonly known as: VENTOLIN HFA INHALE 2 PUFFS INTO THE LUNGS EVERY 4 HOURS AS NEEDED FOR AHEEZING OR SHORTNESS OF BREATH   bumetanide 2 MG tablet Commonly known as: Bumex Please take 2mg  daily in the morning and then 2mg  in the afternoon on MONDAY, [facility] AND FRIDAY   cholecalciferol 25 MCG (1000 UNIT) tablet Commonly known as: VITAMIN D3 Take 1,000 Units by mouth daily.   diazepam 5 MG tablet Commonly known as: VALIUM TAKE ONE TABLET AT BEDTIME   Entresto 24-26 MG Generic drug: sacubitril-valsartan Take 1 tablet by mouth 2 (two) times daily.   estradiol 0.1 MG/GM vaginal cream Commonly known as: ESTRACE VAGINAL USE 2 grams per vagina daily for 2 weeks and then 1 gram per vagina three times per week. What changed: additional instructions   fluticasone 50 MCG/ACT nasal spray Commonly known as: FLONASE Place 1 spray into both nostrils daily as needed for allergies or rhinitis.   HAIR/SKIN/NAILS/BIOTIN PO Take 1 tablet by mouth daily.   Multivitamin Adult Extra C Chew Chew 2 tablets by mouth daily.   HYDROcodone-acetaminophen 5-325 MG  tablet Commonly known as: NORCO/VICODIN TAKE ONE TABLET EVERY 8 HOURS   metolazone 5 MG tablet Commonly known as: ZAROXOLYN TAKE ONE TABLET AS NEEDED ONCE A WEEK 30 MINUTES BEFORE BUMEX DOSE. AS NEEDED ONLY FOR WEIGHT GAIN 2LBS OVERNIGHT OR 5 LBS IN ONE WEEK   metoprolol succinate 25 MG 24 hr tablet Commonly known as: TOPROL-XL Take 1/2 tablet daily   potassium chloride 10 MEQ tablet Commonly known as: KLOR-CON TAKE UP TO 8 TABLETS DAILY AS DIRECTED   trimethoprim-polymyxin b ophthalmic solution Commonly known as: Polytrim Place 2 drops into both eyes every 4 (four) hours.   warfarin 2 MG tablet Commonly known as: COUMADIN Take as directed by the anticoagulation clinic. If you are unsure how to take this medication, talk to your nurse or doctor. Original instructions: TAKE 1 TABLET BY MOUTH DAILY EXCEPT TAKE 1/2 TABLET ON FRIDAYS OR AS DIRECTED  BY ANTICOAGULATION CLINIC        Signature:  Coralyn Helling, MD [facility] Pager - [phone] 02/22/2023, 11:15 AM

## 2023-02-22 NOTE — Patient Instructions (Signed)
Follow up in 1 year.

## 2023-02-28 ENCOUNTER — Encounter: Payer: Self-pay | Admitting: Internal Medicine

## 2023-02-28 ENCOUNTER — Ambulatory Visit (INDEPENDENT_AMBULATORY_CARE_PROVIDER_SITE_OTHER): Payer: Medicare Other | Admitting: Internal Medicine

## 2023-02-28 VITALS — BP 110/70 | HR 78 | Temp 98.3°F | Wt 123.9 lb

## 2023-02-28 DIAGNOSIS — K649 Unspecified hemorrhoids: Secondary | ICD-10-CM

## 2023-02-28 MED ORDER — HYDROCORTISONE (PERIANAL) 2.5 % EX CREA
1.0000 | TOPICAL_CREAM | Freq: Two times a day (BID) | CUTANEOUS | 3 refills | Status: DC
Start: 2023-02-28 — End: 2023-09-21

## 2023-02-28 NOTE — Progress Notes (Signed)
Established Patient Office Visit     CC/Reason for Visit: Discuss hemorrhoids  HPI: Catherine [name] is a 88 y.o. female who is coming in today for the above mentioned reasons.  For the past week has been having significant issues with anal hemorrhoids.  She has had them in the past.   Past Medical/Surgical History: Past Medical History:  Diagnosis Date   Acute on chronic diastolic heart failure (HCC)    AKI (acute kidney injury) (HCC) 06/22/2022   Aortic stenosis, mild 07/26/2017   Arthritis    Asthma    last attack 02/2015   Atrial fibrillation, chronic (HCC)    Cellulitis of left lower extremity 08/17/2016   Ulcer associated with severe venous insufficiency   Chronic anticoagulation    Chronic diastolic CHF (congestive heart failure) (HCC)    Chronic kidney disease    "RIGHT MANY KIDNEY INFECTIONS AND STONES"   COPD (chronic obstructive pulmonary disease) (HCC)    Coronary artery disease    Dizziness    H/O: rheumatic fever    Heart murmur    Hypertension    PONV (postoperative nausea and vomiting)    ' SOMETIMES', BUT NOT ALWAYS"   S/P Maze operation for atrial fibrillation 10/14/2016   Complete bilateral atrial lesion set using cryothermy and bipolar radiofrequency ablation - atrial appendage was not treated due to previous surgical procedure (open mitral commissurotomy)   S/P mitral valve replacement with bioprosthetic valve 10/14/2016   29 mm Medtronic Mosaic porcine bioprosthetic tissue valve   Tricuspid regurgitation 07/14/2021   UTI (urinary tract infection)    Valvular heart disease    Has mitral stenosis with prior mitral commissurotomy in 1970    Past Surgical History:  Procedure Laterality Date   ABDOMINAL HYSTERECTOMY  1983   endometriosis   APPENDECTOMY     BACK SURGERY     neurosurgery x2   CARDIAC CATHETERIZATION     CARDIAC CATHETERIZATION N/A 08/03/2016   Procedure: Right/Left Heart Cath and Coronary Angiography;  Surgeon: Peter M Swaziland, MD;   Location: [facility] INVASIVE CV LAB;  Service: Cardiovascular;  Laterality: N/A;   cataract surg     CHOLECYSTECTOMY N/A 05/30/2015   Procedure: LAPAROSCOPIC CHOLECYSTECTOMY WITH INTRAOPERATIVE CHOLANGIOGRAM;  Surgeon: Glenna Fellows, MD;  Location: [facility] OR;  Service: General;  Laterality: N/A;   COLONOSCOPY     CORONARY ARTERY BYPASS GRAFT N/A 10/14/2016   Procedure: CORONARY ARTERY BYPASS GRAFTING (CABG);  Surgeon: Purcell Nails, MD;  Location: [facility] OR;  Service: Open Heart Surgery;  Laterality: N/A;   EYE SURGERY     MAZE N/A 10/14/2016   Procedure: MAZE;  Surgeon: Purcell Nails, MD;  Location: [facility] OR;  Service: Open Heart Surgery;  Laterality: N/A;   MITRAL VALVE REPLACEMENT N/A 10/14/2016   Procedure: REDO MITRAL VALVE REPLACEMENT (MVR);  Surgeon: Purcell Nails, MD;  Location: [facility] OR;  Service: Open Heart Surgery;  Laterality: N/A;   MITRAL VALVE SURGERY Left 1970   Open mitral commissurotomy via left thoracotomy approach   TEE WITHOUT CARDIOVERSION N/A 07/05/2016   Procedure: TRANSESOPHAGEAL ECHOCARDIOGRAM (TEE);  Surgeon: Chilton Si, MD;  Location: [facility] ENDOSCOPY;  Service: Cardiovascular;  Laterality: N/A;   TEE WITHOUT CARDIOVERSION N/A 10/14/2016   Procedure: TRANSESOPHAGEAL ECHOCARDIOGRAM (TEE);  Surgeon: Purcell Nails, MD;  Location: [facility] OR;  Service: Open Heart Surgery;  Laterality: N/A;    Social History:  reports that she quit smoking about 47 years ago. Her smoking use included cigarettes. She  has a 15.00 pack-year smoking history. She has never used smokeless tobacco. She reports that she does not drink alcohol and does not use drugs.  Allergies: Allergies  Allergen Reactions   Aldactone [Spironolactone] Other (See Comments)    dyspnea   Amoxicillin Palpitations    Tachycardia Has patient had a PCN reaction causing immediate rash, facial/tongue/throat swelling, SOB or lightheadedness with hypotension: no Has patient had a PCN reaction causing severe rash involving mucus  membranes or skin necrosis: {no Has patient had a PCN reaction that required hospitalization {no Has patient had a PCN reaction occurring within the last 10 years: {yes If all of the above answers are "NO", then may proceed with Cephalosporin use.   Diltiazem Other (See Comments)    Causing headaches    Flagyl [Metronidazole Hcl] Other (See Comments)    Causing headaches     Flovent [Fluticasone Propionate] Other (See Comments)    Leg cramps   Gabapentin Swelling   Lyrica [Pregabalin] Swelling   Quinidine Diarrhea and Other (See Comments)    Fever diarrhea   Simvastatin Other (See Comments)    Leg pain, myalgia   Tramadol Nausea Only   Verapamil Other (See Comments)    myalgias   Amlodipine     Low extremity edema   Crestor [Rosuvastatin]     myalgia   Livalo [Pitavastatin]     myalgias   Polymyxin B-Trimethoprim Swelling   Zetia [Ezetimibe]     LEG CRAMPS    Benazepril Hcl Cough   Ciprofloxacin Diarrhea   Codeine Nausea Only   Nitrofurantoin Monohyd Macro Nausea Only    Family History:  Family History  Problem Relation Age of Onset   Leukemia Father    Breast cancer Neg Hx      Current Outpatient Medications:    acetaminophen (TYLENOL) 500 MG tablet, Take 1,000 mg by mouth every 6 (six) hours as needed for mild pain., Disp: , Rfl:    albuterol (VENTOLIN HFA) 108 (90 Base) MCG/ACT inhaler, INHALE 2 PUFFS INTO THE LUNGS EVERY 4 HOURS AS NEEDED FOR AHEEZING OR SHORTNESS OF BREATH, Disp: 8.5 g, Rfl: 2   bumetanide (BUMEX) 2 MG tablet, Please take 2mg  daily in the morning and then 2mg  in the afternoon on MONDAY, [facility] AND FRIDAY, Disp: 120 tablet, Rfl: 3   cholecalciferol (VITAMIN D3) 25 MCG (1000 UNIT) tablet, Take 1,000 Units by mouth daily., Disp: , Rfl:    diazepam (VALIUM) 5 MG tablet, TAKE ONE TABLET AT BEDTIME, Disp: 30 tablet, Rfl: 5   estradiol (ESTRACE VAGINAL) 0.1 MG/GM vaginal cream, USE 2 grams per vagina daily for 2 weeks and then 1 gram per vagina  three times per week. (Patient taking differently: USE 1 gram per vagina three times per week PRN), Disp: 42.5 g, Rfl: 11   fluticasone (FLONASE) 50 MCG/ACT nasal spray, Place 1 spray into both nostrils daily as needed for allergies or rhinitis., Disp: 16 g, Rfl: 2   HYDROcodone-acetaminophen (NORCO/VICODIN) 5-325 MG tablet, TAKE ONE TABLET EVERY 8 HOURS, Disp: 90 tablet, Rfl: 0   metolazone (ZAROXOLYN) 5 MG tablet, TAKE ONE TABLET AS NEEDED ONCE A WEEK 30 MINUTES BEFORE BUMEX DOSE. AS NEEDED ONLY FOR WEIGHT GAIN 2LBS OVERNIGHT OR 5 LBS IN ONE WEEK, Disp: 30 tablet, Rfl: 1   metoprolol succinate (TOPROL-XL) 25 MG 24 hr tablet, Take 1/2 tablet daily, Disp: 45 tablet, Rfl: 2   Multiple Vitamins-Minerals (HAIR/SKIN/NAILS/BIOTIN PO), Take 1 tablet by mouth daily., Disp: , Rfl:  Multiple Vitamins-Minerals (MULTIVITAMIN ADULT EXTRA C) CHEW, Chew 2 tablets by mouth daily., Disp: , Rfl:    potassium chloride (KLOR-CON) 10 MEQ tablet, TAKE UP TO 8 TABLETS DAILY AS DIRECTED, Disp: 720 tablet, Rfl: 3   sacubitril-valsartan (ENTRESTO) 24-26 MG, Take 1 tablet by mouth 2 (two) times daily., Disp: 180 tablet, Rfl: 3   trimethoprim-polymyxin b (POLYTRIM) ophthalmic solution, Place 2 drops into both eyes every 4 (four) hours., Disp: 10 mL, Rfl: 0   warfarin (COUMADIN) 2 MG tablet, TAKE 1 TABLET BY MOUTH DAILY EXCEPT TAKE 1/2 TABLET ON FRIDAYS OR AS DIRECTED BY ANTICOAGULATION CLINIC, Disp: 100 tablet, Rfl: 1  Review of Systems:  Negative unless indicated in HPI.   Physical Exam: Vitals:   02/28/23 1421  BP: 110/70  Pulse: 78  Temp: 98.3 F (36.8 C)  TempSrc: Oral  SpO2: 96%  Weight: 123 lb 14.4 oz (56.2 kg)    Body mass index is 21.95 kg/m.   Physical Exam Vitals reviewed.  Constitutional:      Appearance: Normal appearance.  HENT:     Head: Normocephalic and atraumatic.  Eyes:     Conjunctiva/sclera: Conjunctivae normal.     Pupils: Pupils are equal, round, and reactive to light.  Skin:     General: Skin is warm and dry.  Neurological:     General: No focal deficit present.     Mental Status: She is alert and oriented to person, place, and time.  Psychiatric:        Mood and Affect: Mood normal.        Behavior: Behavior normal.        Thought Content: Thought content normal.        Judgment: Judgment normal.      Impression and Plan:  Hemorrhoids, unspecified hemorrhoid type   -Advised daily sitz bathss, Anusol cream, Preparation H wipes, stool softeners..  If no improvement will need to consider banding or hemorrhoidectomy.  Time spent:23 minutes reviewing chart, interviewing and examining patient and formulating plan of care.     Chaya Jan, MD [facility]

## 2023-03-07 ENCOUNTER — Ambulatory Visit (INDEPENDENT_AMBULATORY_CARE_PROVIDER_SITE_OTHER): Payer: Medicare Other

## 2023-03-07 DIAGNOSIS — Z7901 Long term (current) use of anticoagulants: Secondary | ICD-10-CM

## 2023-03-07 DIAGNOSIS — H04123 Dry eye syndrome of bilateral lacrimal glands: Secondary | ICD-10-CM | POA: Diagnosis not present

## 2023-03-07 LAB — POCT INR: INR: 2.7 (ref 2.0–3.0)

## 2023-03-07 NOTE — Progress Notes (Signed)
Continue 1 tablet daily except take 1/2 tablet on Fridays. Recheck in 6 weeks. 

## 2023-03-07 NOTE — Patient Instructions (Addendum)
Pre visit review using our clinic review tool, if applicable. No additional management support is needed unless otherwise documented below in the visit note.  Continue 1 tablet daily except take 1/2 tablet on Fridays. Recheck in 6 weeks. 

## 2023-03-10 ENCOUNTER — Other Ambulatory Visit: Payer: Self-pay | Admitting: Family Medicine

## 2023-03-23 ENCOUNTER — Encounter: Payer: Self-pay | Admitting: Family Medicine

## 2023-03-23 ENCOUNTER — Ambulatory Visit (INDEPENDENT_AMBULATORY_CARE_PROVIDER_SITE_OTHER): Payer: Medicare Other

## 2023-03-23 ENCOUNTER — Ambulatory Visit (INDEPENDENT_AMBULATORY_CARE_PROVIDER_SITE_OTHER): Payer: Medicare Other | Admitting: Family Medicine

## 2023-03-23 VITALS — BP 154/60 | HR 53 | Temp 97.7°F | Ht 63.0 in | Wt 127.1 lb

## 2023-03-23 DIAGNOSIS — G8929 Other chronic pain: Secondary | ICD-10-CM | POA: Diagnosis not present

## 2023-03-23 DIAGNOSIS — R234 Changes in skin texture: Secondary | ICD-10-CM

## 2023-03-23 DIAGNOSIS — M545 Low back pain, unspecified: Secondary | ICD-10-CM | POA: Diagnosis not present

## 2023-03-23 DIAGNOSIS — I5022 Chronic systolic (congestive) heart failure: Secondary | ICD-10-CM | POA: Diagnosis not present

## 2023-03-23 DIAGNOSIS — Z7901 Long term (current) use of anticoagulants: Secondary | ICD-10-CM | POA: Diagnosis not present

## 2023-03-23 LAB — POCT INR: INR: 1.7 — AB (ref 2.0–3.0)

## 2023-03-23 NOTE — Patient Instructions (Signed)
Pre visit review using our clinic review tool, if applicable. No additional management support is needed unless otherwise documented below in the visit note. 

## 2023-03-23 NOTE — Progress Notes (Unsigned)
Established Patient Office Visit  Subjective   Patient ID: Catherine [name], female    DOB: 1935-02-20  Age: 88 y.o. MRN: [id number]  Chief Complaint  Patient presents with   Medication Consultation    HPI  {History (Optional):23778} Catherine [name] has multiple medical problems including history of heart failure, atrial fibrillation, CAD, COPD, chronic kidney disease, osteoarthritis involving multiple joints, history of kidney stones, chronic Coumadin therapy, chronic insomnia.  She had previous bypass surgery and also previous aortic valve surgery.  She is followed by the Coumadin clinic here.  She relates episode recently when she was getting up off the commode and apparently had a small eschar right thigh which was unroofed and she had significant bleeding afterwards.  Her most recent INR was 2.7.  She is getting this rechecked today.  No bleeding since then.  This was eventually controlled with pressure.  She has severe chronic back pain and currently takes hydrocodone 5/325 mg 1 every 8 hours.  She has actually been recently taking this twice daily but was having significant daytime pain.  She has been reluctant to take third dose because of her diazepam and she is aware of interaction risk with these.  She is on several medications per cardiology for heart failure including Entresto, potassium, metoprolol, Zaroxolyn, and Bumex.  Her edema has been stable recently.  She had recent electrolytes in May which were stable.  Past Medical History:  Diagnosis Date   Acute on chronic diastolic heart failure (HCC)    AKI (acute kidney injury) (HCC) 06/22/2022   Aortic stenosis, mild 07/26/2017   Arthritis    Asthma    last attack 02/2015   Atrial fibrillation, chronic (HCC)    Cellulitis of left lower extremity 08/17/2016   Ulcer associated with severe venous insufficiency   Chronic anticoagulation    Chronic diastolic CHF (congestive heart failure) (HCC)    Chronic kidney disease    "RIGHT MANY  KIDNEY INFECTIONS AND STONES"   COPD (chronic obstructive pulmonary disease) (HCC)    Coronary artery disease    Dizziness    H/O: rheumatic fever    Heart murmur    Hypertension    PONV (postoperative nausea and vomiting)    ' SOMETIMES', BUT NOT ALWAYS"   S/P Maze operation for atrial fibrillation 10/14/2016   Complete bilateral atrial lesion set using cryothermy and bipolar radiofrequency ablation - atrial appendage was not treated due to previous surgical procedure (open mitral commissurotomy)   S/P mitral valve replacement with bioprosthetic valve 10/14/2016   29 mm Medtronic Mosaic porcine bioprosthetic tissue valve   Tricuspid regurgitation 07/14/2021   UTI (urinary tract infection)    Valvular heart disease    Has mitral stenosis with prior mitral commissurotomy in 1970   Past Surgical History:  Procedure Laterality Date   ABDOMINAL HYSTERECTOMY  1983   endometriosis   APPENDECTOMY     BACK SURGERY     neurosurgery x2   CARDIAC CATHETERIZATION     CARDIAC CATHETERIZATION N/A 08/03/2016   Procedure: Right/Left Heart Cath and Coronary Angiography;  Surgeon: Peter M Swaziland, MD;  Location: [facility];  Service: Cardiovascular;  Laterality: N/A;   cataract surg     CHOLECYSTECTOMY N/A 05/30/2015   Procedure: LAPAROSCOPIC CHOLECYSTECTOMY WITH INTRAOPERATIVE CHOLANGIOGRAM;  Surgeon: Glenna Fellows, MD;  Location: [facility] OR;  Service: General;  Laterality: N/A;   COLONOSCOPY     CORONARY ARTERY BYPASS GRAFT N/A 10/14/2016   Procedure: CORONARY ARTERY BYPASS GRAFTING (CABG);  Surgeon: Purcell Nails, MD;  Location: [facility] OR;  Service: Open Heart Surgery;  Laterality: N/A;   EYE SURGERY     MAZE N/A 10/14/2016   Procedure: MAZE;  Surgeon: Purcell Nails, MD;  Location: [facility] OR;  Service: Open Heart Surgery;  Laterality: N/A;   MITRAL VALVE REPLACEMENT N/A 10/14/2016   Procedure: REDO MITRAL VALVE REPLACEMENT (MVR);  Surgeon: Purcell Nails, MD;  Location: [facility] OR;  Service: Open Heart  Surgery;  Laterality: N/A;   MITRAL VALVE SURGERY Left 1970   Open mitral commissurotomy via left thoracotomy approach   TEE WITHOUT CARDIOVERSION N/A 07/05/2016   Procedure: TRANSESOPHAGEAL ECHOCARDIOGRAM (TEE);  Surgeon: Chilton Si, MD;  Location: [facility] ENDOSCOPY;  Service: Cardiovascular;  Laterality: N/A;   TEE WITHOUT CARDIOVERSION N/A 10/14/2016   Procedure: TRANSESOPHAGEAL ECHOCARDIOGRAM (TEE);  Surgeon: Purcell Nails, MD;  Location: [facility] OR;  Service: Open Heart Surgery;  Laterality: N/A;    reports that she quit smoking about 47 years ago. Her smoking use included cigarettes. She has a 15.00 pack-year smoking history. She has never used smokeless tobacco. She reports that she does not drink alcohol and does not use drugs. family history includes Leukemia in her father. Allergies  Allergen Reactions   Aldactone [Spironolactone] Other (See Comments)    dyspnea   Amoxicillin Palpitations    Tachycardia Has patient had a PCN reaction causing immediate rash, facial/tongue/throat swelling, SOB or lightheadedness with hypotension: no Has patient had a PCN reaction causing severe rash involving mucus membranes or skin necrosis: {no Has patient had a PCN reaction that required hospitalization {no Has patient had a PCN reaction occurring within the last 10 years: {yes If all of the above answers are "NO", then may proceed with Cephalosporin use.   Diltiazem Other (See Comments)    Causing headaches    Flagyl [Metronidazole Hcl] Other (See Comments)    Causing headaches     Flovent [Fluticasone Propionate] Other (See Comments)    Leg cramps   Gabapentin Swelling   Lyrica [Pregabalin] Swelling   Quinidine Diarrhea and Other (See Comments)    Fever diarrhea   Simvastatin Other (See Comments)    Leg pain, myalgia   Tramadol Nausea Only   Verapamil Other (See Comments)    myalgias   Amlodipine     Low extremity edema   Crestor [Rosuvastatin]     myalgia   Livalo [Pitavastatin]      myalgias   Polymyxin B-Trimethoprim Swelling   Zetia [Ezetimibe]     LEG CRAMPS    Benazepril Hcl Cough   Ciprofloxacin Diarrhea   Codeine Nausea Only   Nitrofurantoin Monohyd Macro Nausea Only    Review of Systems  Constitutional:  Negative for malaise/fatigue.  Eyes:  Negative for blurred vision.  Respiratory:  Negative for shortness of breath.   Cardiovascular:  Negative for chest pain.  Neurological:  Negative for dizziness, weakness and headaches.      Objective:     BP (!) 154/60 (BP Location: Left Arm, Patient Position: Sitting, Cuff Size: Normal)   Pulse (!) 53   Temp 97.7 F (36.5 C) (Oral)   Ht 5\' 3"  (1.6 m)   Wt 127 lb 1.6 oz (57.7 kg)   SpO2 95%   BMI 22.51 kg/m  BP Readings from Last 3 Encounters:  03/23/23 (!) 154/60  02/28/23 110/70  02/22/23 132/70   Wt Readings from Last 3 Encounters:  03/23/23 127 lb 1.6 oz (57.7 kg)  02/28/23 123 lb 14.4  oz (56.2 kg)  02/22/23 126 lb 3.2 oz (57.2 kg)      Physical Exam Constitutional:      Appearance: She is well-developed.  Eyes:     Pupils: Pupils are equal, round, and reactive to light.  Neck:     Thyroid: No thyromegaly.     Vascular: No JVD.  Cardiovascular:     Rate and Rhythm: Normal rate and regular rhythm.     Heart sounds:     No gallop.  Pulmonary:     Effort: Pulmonary effort is normal. No respiratory distress.     Breath sounds: Normal breath sounds. No wheezing or rales.  Musculoskeletal:     Cervical back: Neck supple.  Skin:    Comments: Right anterior thigh reveals very small eschar on the mid thigh.  No surrounding bruising.  No erythema.  Nontender.  Neurological:     Mental Status: She is alert.      Results for orders placed or performed in visit on 03/23/23  POCT INR  Result Value Ref Range   INR 1.7 (A) 2.0 - 3.0    Last CBC Lab Results  Component Value Date   WBC 9.1 02/11/2023   HGB 13.8 02/11/2023   HCT 41.9 02/11/2023   MCV 91 02/11/2023   MCH 29.8  02/11/2023   RDW 13.5 02/11/2023   PLT 239 02/11/2023   Last metabolic panel Lab Results  Component Value Date   GLUCOSE 87 02/11/2023   NA 143 02/11/2023   K 4.5 02/11/2023   CL 103 02/11/2023   CO2 24 02/11/2023   BUN 18 02/11/2023   CREATININE 0.90 02/11/2023   EGFR 63 02/11/2023   CALCIUM 9.4 02/11/2023   PROT 6.2 06/14/2022   ALBUMIN 4.3 06/14/2022   LABGLOB 1.9 06/14/2022   AGRATIO 2.3 (H) 06/14/2022   BILITOT 0.6 06/14/2022   ALKPHOS 65 06/14/2022   AST 28 06/14/2022   ALT 13 06/14/2022   ANIONGAP 12 07/30/2022   Last lipids Lab Results  Component Value Date   CHOL 297 (H) 06/14/2022   HDL 44 06/14/2022   LDLCALC 195 (H) 06/14/2022   LDLDIRECT 68.0 09/24/2019   TRIG 295 (H) 06/14/2022   CHOLHDL 6.8 (H) 06/14/2022   Last hemoglobin A1c Lab Results  Component Value Date   HGBA1C 5.3 10/12/2016   Last thyroid functions Lab Results  Component Value Date   TSH 1.716 01/05/2022      The ASCVD Risk score (Arnett DK, et al., 2019) failed to calculate for the following reasons:   The 2019 ASCVD risk score is only valid for ages 77 to 21    Assessment & Plan:   #1 chronic back pain.  She is maintained currently on hydrocodone 5/325 mg and currently taking 1 twice daily.  Still having significant breakthrough daytime pain.  We instructed her that she could shorten her interval intake 1 every 6 hours.  She is trying to avoid concomitant use with her diazepam at night.  She does not use any alcohol.  #2 recent bleeding event from her right thigh.  Looks like she has small scratch or eschar that was unroofed.  No signs of secondary infection.  Avoid scratching.  She plans to get INR rechecked today  #3 systolic heart failure currently stable on multidrug regimen as above.  Her weight is stable and she denies any recent increased dyspnea.  Evelena Peat, MD

## 2023-03-23 NOTE — Progress Notes (Signed)
Increase dose today to take 1 1/2 tablets and the continue 1 tablet daily except take 1/2 tablet on Fridays. Recheck in 3 weeks.

## 2023-04-04 ENCOUNTER — Ambulatory Visit (INDEPENDENT_AMBULATORY_CARE_PROVIDER_SITE_OTHER): Payer: Medicare Other | Admitting: Family Medicine

## 2023-04-04 ENCOUNTER — Encounter: Payer: Self-pay | Admitting: Family Medicine

## 2023-04-04 VITALS — BP 120/78 | HR 68 | Temp 98.0°F | Wt 128.4 lb

## 2023-04-04 DIAGNOSIS — N390 Urinary tract infection, site not specified: Secondary | ICD-10-CM | POA: Diagnosis not present

## 2023-04-04 LAB — POCT URINALYSIS DIPSTICK
Bilirubin, UA: NEGATIVE
Blood, UA: NEGATIVE
Glucose, UA: NEGATIVE
Ketones, UA: NEGATIVE
Leukocytes, UA: NEGATIVE
Nitrite, UA: NEGATIVE
Protein, UA: POSITIVE — AB
Spec Grav, UA: 1.01 (ref 1.010–1.025)
Urobilinogen, UA: 1 E.U./dL
pH, UA: 6 (ref 5.0–8.0)

## 2023-04-04 MED ORDER — PHENAZOPYRIDINE HCL 200 MG PO TABS: 200.0000 mg | ORAL_TABLET | Freq: Three times a day (TID) | ORAL | 0 refills | Status: DC | PRN

## 2023-04-04 MED ORDER — FLUCONAZOLE 150 MG PO TABS: 150.0000 mg | ORAL_TABLET | Freq: Once | ORAL | 0 refills | Status: AC

## 2023-04-04 MED ORDER — CEPHALEXIN 500 MG PO CAPS: 500.0000 mg | ORAL_CAPSULE | Freq: Three times a day (TID) | ORAL | 0 refills | Status: DC

## 2023-04-04 NOTE — Progress Notes (Signed)
   Subjective:    Patient ID: Catherine [name], female    DOB: 12/01/1934, 88 y.o.   MRN: [id number]  HPI Here for 3 days of urinary urgency and burning. No fever. She drinks plenty of water. Her last UTI was in March, but no culture was done. She also had one last December, and a culture grew Klebsiella.    Review of Systems  Constitutional: Negative.   Respiratory: Negative.    Cardiovascular: Negative.   Gastrointestinal: Negative.   Genitourinary:  Positive for dysuria, frequency and urgency. Negative for flank pain and hematuria.       Objective:   Physical Exam Constitutional:      Appearance: Normal appearance. She is not ill-appearing.  Cardiovascular:     Rate and Rhythm: Normal rate and regular rhythm.     Pulses: Normal pulses.     Heart sounds: Normal heart sounds.  Pulmonary:     Effort: Pulmonary effort is normal.     Breath sounds: Normal breath sounds.  Abdominal:     Tenderness: There is no right CVA tenderness or left CVA tenderness.  Neurological:     Mental Status: She is alert.           Assessment & Plan:  UTI, treat with 7 days of Keflex 500 mg TID. Culture the sample.  Gershon Crane, MD

## 2023-04-04 NOTE — Addendum Note (Signed)
Addended by: Carola Rhine on: 04/04/2023 04:14 PM   Modules accepted: Orders

## 2023-04-05 LAB — URINE CULTURE
MICRO NUMBER:: 15170602
SPECIMEN QUALITY:: ADEQUATE

## 2023-04-13 ENCOUNTER — Telehealth (HOSPITAL_BASED_OUTPATIENT_CLINIC_OR_DEPARTMENT_OTHER): Payer: Self-pay | Admitting: Cardiovascular Disease

## 2023-04-13 ENCOUNTER — Ambulatory Visit: Payer: Medicare Other

## 2023-04-13 DIAGNOSIS — Z7901 Long term (current) use of anticoagulants: Secondary | ICD-10-CM

## 2023-04-13 LAB — POCT INR: INR: 3.3 — AB (ref 2.0–3.0)

## 2023-04-13 NOTE — Progress Notes (Signed)
Pt was prescribed Keflex and flucanozole on 7/2 for UTI. Pt has finished abx about 1 week ago. Pt reports bilateral foot edema x 1 week. Advised pt to f/u with cardiology concerning edema. Pt verbalized understanding.  Hold dose today and then continue 1 tablet daily except take 1/2 tablet on Fridays. Recheck in 3 weeks.

## 2023-04-13 NOTE — Telephone Encounter (Signed)
Spoke with patient, denies any shortness of breath or increased salt intake Her weight went up only 1 lb Friday morning. She did take Metolazone secondary to the swelling, weight down 1 lb Saturday  Continues to have swelling in feet that does not go down at night.  Did finish Keflex Monday  Saw PCP office today and nurse advised to call cardiology  Again denies any shortness of breath only swelling in feet Advised would discuss with Ronn Melena NP when she returns tomorrow and call back If she does not hear from me before 9 ok to use Metolazone again in the morning if continued swelling

## 2023-04-13 NOTE — Patient Instructions (Addendum)
Pre visit review using our clinic review tool, if applicable. No additional management support is needed unless otherwise documented below in the visit note.  Hold dose today and then continue 1 tablet daily except take 1/2 tablet on Fridays. Recheck in 3 weeks.

## 2023-04-13 NOTE — Telephone Encounter (Signed)
Pt c/o swelling/edema: STAT if pt has developed SOB within 24 hours  If swelling, where is the swelling located?   Still having swelling in feet  How much weight have you gained and in what time span?   No  Have you gained 2 pounds in a day or 5 pounds in a week?   No  Do you have a log of your daily weights (if so, list)?   Yes  Are you currently taking a fluid pill?   Yes  Are you currently SOB?   No  Have you traveled recently in a car or plane for an extended period of time?   No  Patient wants to know if she should be taking her medication differently.

## 2023-04-14 ENCOUNTER — Telehealth: Payer: Medicare Other | Admitting: Family Medicine

## 2023-04-14 NOTE — Telephone Encounter (Signed)
Advised patient, verbalized understanding  

## 2023-04-14 NOTE — Telephone Encounter (Signed)
She may take additional dose of Metolazone this morning. Please ensure she takes it 30 minutes prior to her Bumex.  Recommend sitting with feet elevated and wearing her compression stockings.   Alver Sorrow, NP

## 2023-04-15 ENCOUNTER — Ambulatory Visit (INDEPENDENT_AMBULATORY_CARE_PROVIDER_SITE_OTHER): Payer: Medicare Other | Admitting: Family Medicine

## 2023-04-15 ENCOUNTER — Encounter: Payer: Self-pay | Admitting: Family Medicine

## 2023-04-15 VITALS — BP 120/68 | HR 70 | Temp 98.2°F | Ht 63.0 in | Wt 125.6 lb

## 2023-04-15 DIAGNOSIS — G8929 Other chronic pain: Secondary | ICD-10-CM | POA: Diagnosis not present

## 2023-04-15 DIAGNOSIS — M5441 Lumbago with sciatica, right side: Secondary | ICD-10-CM

## 2023-04-15 NOTE — Patient Instructions (Signed)
May add one 500 mg Tylenol every 6 hours to your current medication regimen.   If above not sufficient for pain control we might want to consider trial of Cymbalta.

## 2023-04-15 NOTE — Progress Notes (Unsigned)
Established Patient Office Visit  Subjective   Patient ID: Catherine [name], female    DOB: 1935/08/19  Age: 88 y.o. MRN: [id number]  Chief Complaint  Patient presents with   Medical Management of Chronic Issues    HPI  {History (Optional):23778} Catherine [name] is here today to discuss her chronic pain management.  She has chronic back pain especially lumbar area worse with position change and especially with back extension and somewhat relieved with back flexion.  She currently takes hydrocodone low dosage about 3 times daily.  She was reluctant to add any Tylenol with her hydrocodone which is 5/325 mg.  She has occasional right radiculitis symptoms.  She had extensive workup with back specialist in the past including physiatrist and they were told she was told that we cannot offer any additional help.  She had problems with peripheral edema with Lyrica and gabapentin in the past.  She is on chronic Coumadin.  Some recent dysuria and urine culture came back negative.  She was placed on Keflex and had increase in INR following that.  Followed closely through Coumadin clinic.  She had some recent increased peripheral edema and contacted cardiologist and had titration of her diuretics and her weight and edema are improved at this time.  No orthopnea.  No PND.  Past Medical History:  Diagnosis Date   Acute on chronic diastolic heart failure (HCC)    AKI (acute kidney injury) (HCC) 06/22/2022   Aortic stenosis, mild 07/26/2017   Arthritis    Asthma    last attack 02/2015   Atrial fibrillation, chronic (HCC)    Cellulitis of left lower extremity 08/17/2016   Ulcer associated with severe venous insufficiency   Chronic anticoagulation    Chronic diastolic CHF (congestive heart failure) (HCC)    Chronic kidney disease    "RIGHT MANY KIDNEY INFECTIONS AND STONES"   COPD (chronic obstructive pulmonary disease) (HCC)    Coronary artery disease    Dizziness    H/O: rheumatic fever    Heart murmur     Hypertension    PONV (postoperative nausea and vomiting)    ' SOMETIMES', BUT NOT ALWAYS"   S/P Maze operation for atrial fibrillation 10/14/2016   Complete bilateral atrial lesion set using cryothermy and bipolar radiofrequency ablation - atrial appendage was not treated due to previous surgical procedure (open mitral commissurotomy)   S/P mitral valve replacement with bioprosthetic valve 10/14/2016   29 mm Medtronic Mosaic porcine bioprosthetic tissue valve   Tricuspid regurgitation 07/14/2021   UTI (urinary tract infection)    Valvular heart disease    Has mitral stenosis with prior mitral commissurotomy in 1970   Past Surgical History:  Procedure Laterality Date   ABDOMINAL HYSTERECTOMY  1983   endometriosis   APPENDECTOMY     BACK SURGERY     neurosurgery x2   CARDIAC CATHETERIZATION     CARDIAC CATHETERIZATION N/A 08/03/2016   Procedure: Right/Left Heart Cath and Coronary Angiography;  Surgeon: Peter M Swaziland, MD;  Location: [facility];  Service: Cardiovascular;  Laterality: N/A;   cataract surg     CHOLECYSTECTOMY N/A 05/30/2015   Procedure: LAPAROSCOPIC CHOLECYSTECTOMY WITH INTRAOPERATIVE CHOLANGIOGRAM;  Surgeon: Glenna Fellows, MD;  Location: [facility] OR;  Service: General;  Laterality: N/A;   COLONOSCOPY     CORONARY ARTERY BYPASS GRAFT N/A 10/14/2016   Procedure: CORONARY ARTERY BYPASS GRAFTING (CABG);  Surgeon: Purcell Nails, MD;  Location: [facility] OR;  Service: Open Heart Surgery;  Laterality: N/A;  EYE SURGERY     MAZE N/A 10/14/2016   Procedure: MAZE;  Surgeon: Purcell Nails, MD;  Location: [facility] OR;  Service: Open Heart Surgery;  Laterality: N/A;   MITRAL VALVE REPLACEMENT N/A 10/14/2016   Procedure: REDO MITRAL VALVE REPLACEMENT (MVR);  Surgeon: Purcell Nails, MD;  Location: [facility] OR;  Service: Open Heart Surgery;  Laterality: N/A;   MITRAL VALVE SURGERY Left 1970   Open mitral commissurotomy via left thoracotomy approach   TEE WITHOUT CARDIOVERSION N/A 07/05/2016    Procedure: TRANSESOPHAGEAL ECHOCARDIOGRAM (TEE);  Surgeon: Chilton Si, MD;  Location: [facility] ENDOSCOPY;  Service: Cardiovascular;  Laterality: N/A;   TEE WITHOUT CARDIOVERSION N/A 10/14/2016   Procedure: TRANSESOPHAGEAL ECHOCARDIOGRAM (TEE);  Surgeon: Purcell Nails, MD;  Location: [facility] OR;  Service: Open Heart Surgery;  Laterality: N/A;    reports that she quit smoking about 47 years ago. Her smoking use included cigarettes. She started smoking about 62 years ago. She has a 15 pack-year smoking history. She has never used smokeless tobacco. She reports that she does not drink alcohol and does not use drugs. family history includes Leukemia in her father. Allergies  Allergen Reactions   Aldactone [Spironolactone] Other (See Comments)    dyspnea   Amoxicillin Palpitations    Tachycardia Has patient had a PCN reaction causing immediate rash, facial/tongue/throat swelling, SOB or lightheadedness with hypotension: no Has patient had a PCN reaction causing severe rash involving mucus membranes or skin necrosis: {no Has patient had a PCN reaction that required hospitalization {no Has patient had a PCN reaction occurring within the last 10 years: {yes If all of the above answers are "NO", then may proceed with Cephalosporin use.   Diltiazem Other (See Comments)    Causing headaches    Flagyl [Metronidazole Hcl] Other (See Comments)    Causing headaches     Flovent [Fluticasone Propionate] Other (See Comments)    Leg cramps   Gabapentin Swelling   Lyrica [Pregabalin] Swelling   Quinidine Diarrhea and Other (See Comments)    Fever diarrhea   Simvastatin Other (See Comments)    Leg pain, myalgia   Tramadol Nausea Only   Verapamil Other (See Comments)    myalgias   Amlodipine     Low extremity edema   Crestor [Rosuvastatin]     myalgia   Livalo [Pitavastatin]     myalgias   Polymyxin B-Trimethoprim Swelling   Zetia [Ezetimibe]     LEG CRAMPS    Benazepril Hcl Cough   Ciprofloxacin  Diarrhea   Codeine Nausea Only   Nitrofurantoin Monohyd Macro Nausea Only    Review of Systems  Constitutional:  Negative for chills and fever.  Cardiovascular:  Negative for chest pain.  Musculoskeletal:  Positive for back pain.      Objective:     BP 120/68 (BP Location: Left Arm, Patient Position: Sitting, Cuff Size: Normal)   Pulse 70   Temp 98.2 F (36.8 C) (Oral)   Ht 5\' 3"  (1.6 m)   Wt 125 lb 9.6 oz (57 kg)   SpO2 96%   BMI 22.25 kg/m  BP Readings from Last 3 Encounters:  04/15/23 120/68  04/04/23 120/78  03/23/23 (!) 154/60   Wt Readings from Last 3 Encounters:  04/15/23 125 lb 9.6 oz (57 kg)  04/04/23 128 lb 6.4 oz (58.2 kg)  03/23/23 127 lb 1.6 oz (57.7 kg)      Physical Exam Vitals reviewed.  Constitutional:      General: She is  not in acute distress.    Appearance: Normal appearance. She is not ill-appearing.  Cardiovascular:     Rate and Rhythm: Normal rate and regular rhythm.  Pulmonary:     Effort: Pulmonary effort is normal.     Breath sounds: Normal breath sounds.  Musculoskeletal:     Right lower leg: No edema.     Left lower leg: No edema.  Neurological:     Mental Status: She is alert.      No results found for any visits on 04/15/23.  Last CBC Lab Results  Component Value Date   WBC 9.1 02/11/2023   HGB 13.8 02/11/2023   HCT 41.9 02/11/2023   MCV 91 02/11/2023   MCH 29.8 02/11/2023   RDW 13.5 02/11/2023   PLT 239 02/11/2023   Last metabolic panel Lab Results  Component Value Date   GLUCOSE 87 02/11/2023   NA 143 02/11/2023   K 4.5 02/11/2023   CL 103 02/11/2023   CO2 24 02/11/2023   BUN 18 02/11/2023   CREATININE 0.90 02/11/2023   EGFR 63 02/11/2023   CALCIUM 9.4 02/11/2023   PROT 6.2 06/14/2022   ALBUMIN 4.3 06/14/2022   LABGLOB 1.9 06/14/2022   AGRATIO 2.3 (H) 06/14/2022   BILITOT 0.6 06/14/2022   ALKPHOS 65 06/14/2022   AST 28 06/14/2022   ALT 13 06/14/2022   ANIONGAP 12 07/30/2022      The ASCVD Risk  score (Arnett DK, et al., 2019) failed to calculate for the following reasons:   The 2019 ASCVD risk score is only valid for ages 60 to 37    Assessment & Plan:   #1 chronic low back pain.  Suspected lumbar stenosis.  Occasional right lumbar radiculitis symptoms.  Discussed options.  She has had previous intolerance with Lyrica and gabapentin.  We discussed adding 1 extra 500 mg Tylenol to her hydrocodone 5/325 mg.  We also discussed potential option of Cymbalta but she would like to try adding extra Tylenol a few times daily as a first step    Evelena Peat, MD

## 2023-04-18 ENCOUNTER — Ambulatory Visit: Payer: Medicare Other

## 2023-04-19 ENCOUNTER — Other Ambulatory Visit: Payer: Self-pay | Admitting: Family Medicine

## 2023-05-04 ENCOUNTER — Ambulatory Visit (INDEPENDENT_AMBULATORY_CARE_PROVIDER_SITE_OTHER): Payer: Medicare Other

## 2023-05-04 ENCOUNTER — Telehealth: Payer: Self-pay

## 2023-05-04 DIAGNOSIS — Z7901 Long term (current) use of anticoagulants: Secondary | ICD-10-CM

## 2023-05-04 LAB — POCT INR: INR: 3.8 — AB (ref 2.0–3.0)

## 2023-05-04 NOTE — Telephone Encounter (Signed)
Pt in coumadin clinic today and reported she will be out of diazepam at the end of the week. Advised a msg would be sent to PCP requesting refill. She would like it sent to [facility].  Last fill 11/08/22, #30, 5 RF

## 2023-05-04 NOTE — Progress Notes (Signed)
Hold dose today and then change weekly dose to take 1 tablet daily except take 1/2 tablet on Mondays and Fridays. Recheck in 2 weeks.

## 2023-05-04 NOTE — Patient Instructions (Addendum)
Pre visit review using our clinic review tool, if applicable. No additional management support is needed unless otherwise documented below in the visit note.  Hold dose today and then change weekly dose to take 1 tablet daily except take 1/2 tablet on Mondays and Fridays. Recheck in 2 weeks.

## 2023-05-05 MED ORDER — DIAZEPAM 5 MG PO TABS: 5.0000 mg | ORAL_TABLET | Freq: Every day | ORAL | 5 refills | Status: DC

## 2023-05-10 ENCOUNTER — Ambulatory Visit (INDEPENDENT_AMBULATORY_CARE_PROVIDER_SITE_OTHER): Payer: Medicare Other | Admitting: Family Medicine

## 2023-05-10 VITALS — BP 150/68 | HR 65 | Temp 98.0°F | Ht 63.0 in | Wt 130.9 lb

## 2023-05-10 DIAGNOSIS — Z7901 Long term (current) use of anticoagulants: Secondary | ICD-10-CM

## 2023-05-10 DIAGNOSIS — M5441 Lumbago with sciatica, right side: Secondary | ICD-10-CM | POA: Diagnosis not present

## 2023-05-10 DIAGNOSIS — F419 Anxiety disorder, unspecified: Secondary | ICD-10-CM

## 2023-05-10 DIAGNOSIS — R300 Dysuria: Secondary | ICD-10-CM | POA: Diagnosis not present

## 2023-05-10 DIAGNOSIS — G8929 Other chronic pain: Secondary | ICD-10-CM

## 2023-05-10 LAB — POC URINALSYSI DIPSTICK (AUTOMATED)
Bilirubin, UA: POSITIVE
Blood, UA: NEGATIVE
Glucose, UA: POSITIVE — AB
Ketones, UA: NEGATIVE
Nitrite, UA: POSITIVE
Protein, UA: POSITIVE — AB
Spec Grav, UA: 1.015 (ref 1.010–1.025)
Urobilinogen, UA: 4 E.U./dL — AB
pH, UA: 5 (ref 5.0–8.0)

## 2023-05-10 MED ORDER — CEPHALEXIN 500 MG PO CAPS: 500.0000 mg | ORAL_CAPSULE | Freq: Three times a day (TID) | ORAL | 0 refills | Status: DC

## 2023-05-10 MED ORDER — FLUCONAZOLE 150 MG PO TABS: 150.0000 mg | ORAL_TABLET | Freq: Once | ORAL | 0 refills | Status: AC

## 2023-05-10 NOTE — Progress Notes (Signed)
Established Patient Office Visit  Subjective   Patient ID: Catherine [name], female    DOB: Dec 21, 1934  Age: 88 y.o. MRN: [id number]  Chief Complaint  Patient presents with   Dysuria    HPI   Alundra has history of congestive heart failure, atrial fibrillation, CAD, hypertension, history rheumatic heart disease, history of rheumatic mitral stenosis, COPD, chronic kidney disease, history of kidney stones, chronic Coumadin therapy, chronic insomnia and is seen today for the following issues  She has had frequent dysuria in the past although several of her cultures have come back negative.  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  She did not seem to get any benefit from topical estrogen.  She relates relatively sudden onset last Saturday around 9 PM of burning with urination.  Possibly some mild frequency.  No fever.  No flank pain.  No nausea or vomiting.  She took prescription Azo which has not seemed to help.  She is on Coumadin and had recent INR 3.8.  She held Coumadin last Wednesday and took half dose on Friday.  She has scheduled follow-up next week to reassess that.  Has battled with peripheral edema for some time although this seems to be fairly stable recently.  She does wear self daily.  She is on fairly strong diuretic dosing with bumetanide and as needed metolazone  Chronic insomnia.  She has been on diazepam 5 mg nightly for many years.  We tried other alternatives without very much success.  She has chronic back pain and has seen multiple back specialist in the past.  They basically could not offer any further help after many failed injections.  She is currently on hydrocodone 5 mg 3 times daily.  She takes dose around 7 AM, 12 noon, and 7 PM.  Still has some breakthrough pain.  We expressed our concern about her taking higher dose opioids especially in combination with her diazepam at night.  She does not use any alcohol.  Past Medical History:  Diagnosis Date   Acute on chronic  diastolic heart failure (HCC)    AKI (acute kidney injury) (HCC) 06/22/2022   Aortic stenosis, mild 07/26/2017   Arthritis    Asthma    last attack 02/2015   Atrial fibrillation, chronic (HCC)    Cellulitis of left lower extremity 08/17/2016   Ulcer associated with severe venous insufficiency   Chronic anticoagulation    Chronic diastolic CHF (congestive heart failure) (HCC)    Chronic kidney disease    "RIGHT MANY KIDNEY INFECTIONS AND STONES"   COPD (chronic obstructive pulmonary disease) (HCC)    Coronary artery disease    Dizziness    H/O: rheumatic fever    Heart murmur    Hypertension    PONV (postoperative nausea and vomiting)    ' SOMETIMES', BUT NOT ALWAYS"   S/P Maze operation for atrial fibrillation 10/14/2016   Complete bilateral atrial lesion set using cryothermy and bipolar radiofrequency ablation - atrial appendage was not treated due to previous surgical procedure (open mitral commissurotomy)   S/P mitral valve replacement with bioprosthetic valve 10/14/2016   29 mm Medtronic Mosaic porcine bioprosthetic tissue valve   Tricuspid regurgitation 07/14/2021   UTI (urinary tract infection)    Valvular heart disease    Has mitral stenosis with prior mitral commissurotomy in 1970   Past Surgical History:  Procedure Laterality Date   ABDOMINAL HYSTERECTOMY  1983   endometriosis   APPENDECTOMY     BACK SURGERY  neurosurgery x2   CARDIAC CATHETERIZATION     CARDIAC CATHETERIZATION N/A 08/03/2016   Procedure: Right/Left Heart Cath and Coronary Angiography;  Surgeon: Peter M Swaziland, MD;  Location: [facility] INVASIVE CV LAB;  Service: Cardiovascular;  Laterality: N/A;   cataract surg     CHOLECYSTECTOMY N/A 05/30/2015   Procedure: LAPAROSCOPIC CHOLECYSTECTOMY WITH INTRAOPERATIVE CHOLANGIOGRAM;  Surgeon: Glenna Fellows, MD;  Location: [facility] OR;  Service: General;  Laterality: N/A;   COLONOSCOPY     CORONARY ARTERY BYPASS GRAFT N/A 10/14/2016   Procedure: CORONARY ARTERY BYPASS  GRAFTING (CABG);  Surgeon: Purcell Nails, MD;  Location: [facility] OR;  Service: Open Heart Surgery;  Laterality: N/A;   EYE SURGERY     MAZE N/A 10/14/2016   Procedure: MAZE;  Surgeon: Purcell Nails, MD;  Location: [facility] OR;  Service: Open Heart Surgery;  Laterality: N/A;   MITRAL VALVE REPLACEMENT N/A 10/14/2016   Procedure: REDO MITRAL VALVE REPLACEMENT (MVR);  Surgeon: Purcell Nails, MD;  Location: [facility] OR;  Service: Open Heart Surgery;  Laterality: N/A;   MITRAL VALVE SURGERY Left 1970   Open mitral commissurotomy via left thoracotomy approach   TEE WITHOUT CARDIOVERSION N/A 07/05/2016   Procedure: TRANSESOPHAGEAL ECHOCARDIOGRAM (TEE);  Surgeon: Chilton Si, MD;  Location: [facility] ENDOSCOPY;  Service: Cardiovascular;  Laterality: N/A;   TEE WITHOUT CARDIOVERSION N/A 10/14/2016   Procedure: TRANSESOPHAGEAL ECHOCARDIOGRAM (TEE);  Surgeon: Purcell Nails, MD;  Location: [facility] OR;  Service: Open Heart Surgery;  Laterality: N/A;    reports that she quit smoking about 47 years ago. Her smoking use included cigarettes. She started smoking about 62 years ago. She has a 15 pack-year smoking history. She has never used smokeless tobacco. She reports that she does not drink alcohol and does not use drugs. family history includes Leukemia in her father. Allergies  Allergen Reactions   Aldactone [Spironolactone] Other (See Comments)    dyspnea   Amoxicillin Palpitations    Tachycardia Has patient had a PCN reaction causing immediate rash, facial/tongue/throat swelling, SOB or lightheadedness with hypotension: no Has patient had a PCN reaction causing severe rash involving mucus membranes or skin necrosis: {no Has patient had a PCN reaction that required hospitalization {no Has patient had a PCN reaction occurring within the last 10 years: {yes If all of the above answers are "NO", then may proceed with Cephalosporin use.   Diltiazem Other (See Comments)    Causing headaches    Flagyl [Metronidazole Hcl]  Other (See Comments)    Causing headaches     Flovent [Fluticasone Propionate] Other (See Comments)    Leg cramps   Gabapentin Swelling   Lyrica [Pregabalin] Swelling   Quinidine Diarrhea and Other (See Comments)    Fever diarrhea   Simvastatin Other (See Comments)    Leg pain, myalgia   Tramadol Nausea Only   Verapamil Other (See Comments)    myalgias   Amlodipine     Low extremity edema   Crestor [Rosuvastatin]     myalgia   Livalo [Pitavastatin]     myalgias   Polymyxin B-Trimethoprim Swelling   Zetia [Ezetimibe]     LEG CRAMPS    Benazepril Hcl Cough   Ciprofloxacin Diarrhea   Codeine Nausea Only   Nitrofurantoin Monohyd Macro Nausea Only      Review of Systems  Constitutional:  Positive for malaise/fatigue. Negative for chills and fever.  Cardiovascular:  Negative for chest pain.  Gastrointestinal:  Negative for nausea and vomiting.  Genitourinary:  Positive for  dysuria. Negative for flank pain and hematuria.      Objective:     BP (!) 150/68 (BP Location: Left Arm, Patient Position: Sitting, Cuff Size: Normal)   Pulse 65   Temp 98 F (36.7 C) (Oral)   Ht 5\' 3"  (1.6 m)   Wt 130 lb 14.4 oz (59.4 kg)   SpO2 95%   BMI 23.19 kg/m  BP Readings from Last 3 Encounters:  05/10/23 (!) 150/68  04/15/23 120/68  04/04/23 120/78   Wt Readings from Last 3 Encounters:  05/10/23 130 lb 14.4 oz (59.4 kg)  04/15/23 125 lb 9.6 oz (57 kg)  04/04/23 128 lb 6.4 oz (58.2 kg)      Physical Exam Vitals reviewed.  Constitutional:      General: She is not in acute distress.    Appearance: Normal appearance.  HENT:     Head: Normocephalic and atraumatic.  Cardiovascular:     Rate and Rhythm: Normal rate and regular rhythm.  Pulmonary:     Effort: Pulmonary effort is normal.     Breath sounds: Normal breath sounds. No wheezing or rales.  Musculoskeletal:     Right lower leg: No edema.     Left lower leg: No edema.  Neurological:     Mental Status: She is  alert.      Results for orders placed or performed in visit on 05/10/23  POCT Urinalysis Dipstick (Automated)  Result Value Ref Range   Color, UA Yellow    Clarity, UA Clear    Glucose, UA Positive (A) Negative   Bilirubin, UA Positive    Ketones, UA Negative    Spec Grav, UA 1.015 1.010 - 1.025   Blood, UA Negative    pH, UA 5.0 5.0 - 8.0   Protein, UA Positive (A) Negative   Urobilinogen, UA 4.0 (A) 0.2 or 1.0 E.U./dL   Nitrite, UA Positive    Leukocytes, UA Large (3+) (A) Negative    Last CBC Lab Results  Component Value Date   WBC 9.1 02/11/2023   HGB 13.8 02/11/2023   HCT 41.9 02/11/2023   MCV 91 02/11/2023   MCH 29.8 02/11/2023   RDW 13.5 02/11/2023   PLT 239 02/11/2023   Last metabolic panel Lab Results  Component Value Date   GLUCOSE 87 02/11/2023   NA 143 02/11/2023   K 4.5 02/11/2023   CL 103 02/11/2023   CO2 24 02/11/2023   BUN 18 02/11/2023   CREATININE 0.90 02/11/2023   EGFR 63 02/11/2023   CALCIUM 9.4 02/11/2023   PROT 6.2 06/14/2022   ALBUMIN 4.3 06/14/2022   LABGLOB 1.9 06/14/2022   AGRATIO 2.3 (H) 06/14/2022   BILITOT 0.6 06/14/2022   ALKPHOS 65 06/14/2022   AST 28 06/14/2022   ALT 13 06/14/2022   ANIONGAP 12 07/30/2022   Last lipids Lab Results  Component Value Date   CHOL 297 (H) 06/14/2022   HDL 44 06/14/2022   LDLCALC 195 (H) 06/14/2022   LDLDIRECT 68.0 09/24/2019   TRIG 295 (H) 06/14/2022   CHOLHDL 6.8 (H) 06/14/2022   Last thyroid functions Lab Results  Component Value Date   TSH 1.716 01/05/2022      The ASCVD Risk score (Arnett DK, et al., 2019) failed to calculate for the following reasons:   The 2019 ASCVD risk score is only valid for ages 67 to 79    Assessment & Plan:   #1 dysuria.  Patient has longstanding history of recurrent dysuria.  Suspected atrophic  vaginitis.  Urine dipstick today reveals large leukocytes and positive nitrites although difficult to assess accuracy as she was taking Azo.  Urine culture  sent.  Will cover with Keflex 500 mg 3 times daily for 7 days pending culture results.  Patient also requesting fluconazole 150 mg x 1 dose while taking Keflex  #2 chronic Coumadin therapy.  Will need to get INR followed closely.  She has scheduled follow-up in a week to get INR rechecked  #3 chronic low back pain.  Patient currently on Vicodin 5/325 mg 1 3 times daily.  She has fair pain control through much of the day but still has breakthrough at times.  Try to avoid escalation especially of nighttime dosing with her diazepam.   Evelena Peat, MD

## 2023-05-18 ENCOUNTER — Ambulatory Visit (INDEPENDENT_AMBULATORY_CARE_PROVIDER_SITE_OTHER): Payer: Medicare Other

## 2023-05-18 ENCOUNTER — Ambulatory Visit: Payer: Medicare Other | Admitting: Family Medicine

## 2023-05-18 DIAGNOSIS — Z7901 Long term (current) use of anticoagulants: Secondary | ICD-10-CM | POA: Diagnosis not present

## 2023-05-18 LAB — POCT INR: INR: 4.2 — AB (ref 2.0–3.0)

## 2023-05-18 NOTE — Progress Notes (Addendum)
Pt had OV on 8/13 for UTI and was prescribed Keflex, x 7 days, and fluconazole.  Hold dose today and hold dose tomorrow and then change weekly dose to take 1 tablet daily except take 1/2 tablet on Mondays, Wednesday and Fridays. Recheck in 1 weeks.  Pt's husband, Catherine [name], is with pt today and reports elevated BP. A msg was sent to [facility] concerning the BP. They recommended an OV. Catherine [name] has been scheduled for an OV with Shirline Frees, PA, for tomorrow. Advised Catherine [name] if BP was any higher when taking it at home to go to the ER. Catherine [name] verbalized understanding.

## 2023-05-18 NOTE — Patient Instructions (Addendum)
Pre visit review using our clinic review tool, if applicable. No additional management support is needed unless otherwise documented below in the visit note.  Hold dose today and hold dose tomorrow and then change weekly dose to take 1 tablet daily except take 1/2 tablet on Mondays, Wednesday and Fridays. Recheck in 1 weeks.

## 2023-05-25 ENCOUNTER — Other Ambulatory Visit: Payer: Self-pay | Admitting: Family Medicine

## 2023-05-25 ENCOUNTER — Ambulatory Visit (INDEPENDENT_AMBULATORY_CARE_PROVIDER_SITE_OTHER): Payer: Medicare Other

## 2023-05-25 DIAGNOSIS — Z7901 Long term (current) use of anticoagulants: Secondary | ICD-10-CM

## 2023-05-25 LAB — POCT INR: INR: 3.5 — AB (ref 2.0–3.0)

## 2023-05-25 NOTE — Progress Notes (Signed)
Pt reports increased BLL edema. She has increased her diuretic.  Hold dose today and then change weekly dose to take 1/2 tablet daily except take 1 tablet on Mondays, Wednesday and Fridays. Recheck in 2 weeks.

## 2023-05-25 NOTE — Patient Instructions (Addendum)
Pre visit review using our clinic review tool, if applicable. No additional management support is needed unless otherwise documented below in the visit note.  Hold dose today and then change weekly dose to take 1/2 tablet daily except take 1 tablet on Mondays, Wednesday and Fridays. Recheck in 2 weeks.

## 2023-06-03 ENCOUNTER — Telehealth: Payer: Self-pay | Admitting: Cardiovascular Disease

## 2023-06-03 NOTE — Telephone Encounter (Signed)
Patient is requesting to speak with RN Regis Bill. Please advise.

## 2023-06-03 NOTE — Telephone Encounter (Signed)
Spoke with patient regarding swelling in lower extremities Has taken her Metolazone weekly for 2 weeks, would like to continue until swelling improved Does have some changes in her breathing Weight did get up to 130's end of August but down to 120's now Discussed with Dr Cristal Deer and ok to take Metolazone every other day for total of 3 doses to see if improves, call back if no improvement. Does have visit 9/11 with Dr Caryl Never  Advised patient, verbalized understanding.

## 2023-06-08 ENCOUNTER — Encounter: Payer: Self-pay | Admitting: Family Medicine

## 2023-06-08 ENCOUNTER — Ambulatory Visit (INDEPENDENT_AMBULATORY_CARE_PROVIDER_SITE_OTHER): Payer: Medicare Other

## 2023-06-08 ENCOUNTER — Ambulatory Visit (INDEPENDENT_AMBULATORY_CARE_PROVIDER_SITE_OTHER): Payer: Medicare Other | Admitting: Family Medicine

## 2023-06-08 VITALS — BP 136/76 | HR 55 | Temp 98.1°F | Ht 63.0 in | Wt 127.9 lb

## 2023-06-08 DIAGNOSIS — G8929 Other chronic pain: Secondary | ICD-10-CM

## 2023-06-08 DIAGNOSIS — Z7901 Long term (current) use of anticoagulants: Secondary | ICD-10-CM | POA: Diagnosis not present

## 2023-06-08 DIAGNOSIS — M5441 Lumbago with sciatica, right side: Secondary | ICD-10-CM | POA: Diagnosis not present

## 2023-06-08 DIAGNOSIS — I4811 Longstanding persistent atrial fibrillation: Secondary | ICD-10-CM | POA: Diagnosis not present

## 2023-06-08 DIAGNOSIS — R600 Localized edema: Secondary | ICD-10-CM | POA: Diagnosis not present

## 2023-06-08 LAB — POCT INR: INR: 1.8 — AB (ref 2.0–3.0)

## 2023-06-08 NOTE — Patient Instructions (Addendum)
Pre visit review using our clinic review tool, if applicable. No additional management support is needed unless otherwise documented below in the visit note.  Increase dose today to take 1 1/2 tablets and then continue 1/2 tablet daily except take 1 tablet on Mondays, Wednesday and Fridays. Recheck in 4 weeks.

## 2023-06-08 NOTE — Patient Instructions (Signed)
Continue with the Bumex daily  Continue with the Metolazone 5 mg with one more this Friday and then resume once weekly as needed.

## 2023-06-08 NOTE — Progress Notes (Unsigned)
Established Patient Office Visit  Subjective   Patient ID: Catherine [name], female    DOB: 1935-03-27  Age: 88 y.o. MRN: [id number]  Chief Complaint  Patient presents with   Back Pain   Leg Pain    HPI  {History (Optional):23778} Catherine [name] is seen for medical follow-up.  She has multiple chronic problems including heart failure, atrial fibrillation, CAD, hypertension, COPD, chronic kidney disease, kidney stones  She has battled chronic bilateral leg edema.  She takes Bumex daily and has in the past taken metolazone 5 mg once weekly as needed for increased edema.  Her weight had gone up from her baseline around 124-1 30 recently.  She started metolazone and took this for a few days in a row.  Current weight is back down around 124.  She has some chronic dyspnea with exertion but unchanged.  No orthopnea.  No significant edema at this time.  She is on chronic Coumadin therapy for A-fib.  Recent INR 3.5.  This was rechecked today and 1.8.  She has had ongoing pain issues from her back.  Had seen multiple back specialist in the past not felt to be surgical candidate nor candidate for further injections.  Currently taking hydrocodone 5 mg 3 times daily but still has some poor pain control at times.  Has some right-sided radiculitis symptoms.  No weakness.  Previous intolerance with gabapentin and Lyrica secondary to edema.  Past Medical History:  Diagnosis Date   Acute on chronic diastolic heart failure (HCC)    AKI (acute kidney injury) (HCC) 06/22/2022   Aortic stenosis, mild 07/26/2017   Arthritis    Asthma    last attack 02/2015   Atrial fibrillation, chronic (HCC)    Cellulitis of left lower extremity 08/17/2016   Ulcer associated with severe venous insufficiency   Chronic anticoagulation    Chronic diastolic CHF (congestive heart failure) (HCC)    Chronic kidney disease    "RIGHT MANY KIDNEY INFECTIONS AND STONES"   COPD (chronic obstructive pulmonary disease) (HCC)    Coronary  artery disease    Dizziness    H/O: rheumatic fever    Heart murmur    Hypertension    PONV (postoperative nausea and vomiting)    ' SOMETIMES', BUT NOT ALWAYS"   S/P Maze operation for atrial fibrillation 10/14/2016   Complete bilateral atrial lesion set using cryothermy and bipolar radiofrequency ablation - atrial appendage was not treated due to previous surgical procedure (open mitral commissurotomy)   S/P mitral valve replacement with bioprosthetic valve 10/14/2016   29 mm Medtronic Mosaic porcine bioprosthetic tissue valve   Tricuspid regurgitation 07/14/2021   UTI (urinary tract infection)    Valvular heart disease    Has mitral stenosis with prior mitral commissurotomy in 1970   Past Surgical History:  Procedure Laterality Date   ABDOMINAL HYSTERECTOMY  1983   endometriosis   APPENDECTOMY     BACK SURGERY     neurosurgery x2   CARDIAC CATHETERIZATION     CARDIAC CATHETERIZATION N/A 08/03/2016   Procedure: Right/Left Heart Cath and Coronary Angiography;  Surgeon: Peter M Swaziland, MD;  Location: [facility] INVASIVE CV LAB;  Service: Cardiovascular;  Laterality: N/A;   cataract surg     CHOLECYSTECTOMY N/A 05/30/2015   Procedure: LAPAROSCOPIC CHOLECYSTECTOMY WITH INTRAOPERATIVE CHOLANGIOGRAM;  Surgeon: Glenna Fellows, MD;  Location: [facility] OR;  Service: General;  Laterality: N/A;   COLONOSCOPY     CORONARY ARTERY BYPASS GRAFT N/A 10/14/2016   Procedure: CORONARY ARTERY BYPASS  GRAFTING (CABG);  Surgeon: Purcell Nails, MD;  Location: [facility] OR;  Service: Open Heart Surgery;  Laterality: N/A;   EYE SURGERY     MAZE N/A 10/14/2016   Procedure: MAZE;  Surgeon: Purcell Nails, MD;  Location: [facility] OR;  Service: Open Heart Surgery;  Laterality: N/A;   MITRAL VALVE REPLACEMENT N/A 10/14/2016   Procedure: REDO MITRAL VALVE REPLACEMENT (MVR);  Surgeon: Purcell Nails, MD;  Location: [facility] OR;  Service: Open Heart Surgery;  Laterality: N/A;   MITRAL VALVE SURGERY Left 1970   Open mitral commissurotomy via  left thoracotomy approach   TEE WITHOUT CARDIOVERSION N/A 07/05/2016   Procedure: TRANSESOPHAGEAL ECHOCARDIOGRAM (TEE);  Surgeon: Chilton Si, MD;  Location[facility] ENDOSCOPY;  Service: Cardiovascular;  Laterality: N/A;   TEE WITHOUT CARDIOVERSION N/A 10/14/2016   Procedure: TRANSESOPHAGEAL ECHOCARDIOGRAM (TEE);  Surgeon: Purcell Nails, MD;  Location: [facility] OR;  Service: Open Heart Surgery;  Laterality: N/A;    reports that she quit smoking about 47 years ago. Her smoking use included cigarettes. She started smoking about 62 years ago. She has a 15 pack-year smoking history. She has never used smokeless tobacco. She reports that she does not drink alcohol and does not use drugs. family history includes Leukemia in her father. Allergies  Allergen Reactions   Aldactone [Spironolactone] Other (See Comments)    dyspnea   Amoxicillin Palpitations    Tachycardia Has patient had a PCN reaction causing immediate rash, facial/tongue/throat swelling, SOB or lightheadedness with hypotension: no Has patient had a PCN reaction causing severe rash involving mucus membranes or skin necrosis: {no Has patient had a PCN reaction that required hospitalization {no Has patient had a PCN reaction occurring within the last 10 years: {yes If all of the above answers are "NO", then may proceed with Cephalosporin use.   Diltiazem Other (See Comments)    Causing headaches    Flagyl [Metronidazole Hcl] Other (See Comments)    Causing headaches     Flovent [Fluticasone Propionate] Other (See Comments)    Leg cramps   Gabapentin Swelling   Lyrica [Pregabalin] Swelling   Quinidine Diarrhea and Other (See Comments)    Fever diarrhea   Simvastatin Other (See Comments)    Leg pain, myalgia   Tramadol Nausea Only   Verapamil Other (See Comments)    myalgias   Amlodipine     Low extremity edema   Crestor [Rosuvastatin]     myalgia   Livalo [Pitavastatin]     myalgias   Polymyxin B-Trimethoprim Swelling   Zetia  [Ezetimibe]     LEG CRAMPS    Benazepril Hcl Cough   Ciprofloxacin Diarrhea   Codeine Nausea Only   Nitrofurantoin Monohyd Macro Nausea Only    Review of Systems  Constitutional:  Positive for malaise/fatigue. Negative for chills and fever.  Eyes:  Negative for blurred vision.  Respiratory:  Negative for shortness of breath.   Cardiovascular:  Negative for chest pain.       See HPI  Neurological:  Negative for dizziness, weakness and headaches.      Objective:     BP 136/76 (BP Location: Left Arm, Patient Position: Sitting, Cuff Size: Normal)   Pulse (!) 55   Temp 98.1 F (36.7 C) (Oral)   Ht 5\' 3"  (1.6 m)   Wt 127 lb 14.4 oz (58 kg)   SpO2 96%   BMI 22.66 kg/m  BP Readings from Last 3 Encounters:  06/08/23 136/76  05/12/23 (!) 150/68  04/15/23  120/68   Wt Readings from Last 3 Encounters:  06/08/23 127 lb 14.4 oz (58 kg)  05/10/23 130 lb 14.4 oz (59.4 kg)  04/15/23 125 lb 9.6 oz (57 kg)      Physical Exam Vitals reviewed.  Constitutional:      Appearance: Normal appearance.  Cardiovascular:     Rate and Rhythm: Normal rate and regular rhythm.  Pulmonary:     Effort: Pulmonary effort is normal.     Breath sounds: Normal breath sounds. No wheezing or rales.  Musculoskeletal:     Right lower leg: No edema.     Left lower leg: No edema.     Comments: No significant edema at this time.  Neurological:     General: No focal deficit present.     Mental Status: She is alert.      Results for orders placed or performed in visit on 06/08/23  POCT INR  Result Value Ref Range   INR 1.8 (A) 2.0 - 3.0    {Labs (Optional):23779}  The ASCVD Risk score (Arnett DK, et al., 2019) failed to calculate for the following reasons:   The 2019 ASCVD risk score is only valid for ages 48 to 73    Assessment & Plan:   #1 recent bilateral leg and ankle edema.  She appears to be back to baseline currently based on her home weights.  Continue regular Bumex.  She will  revert back to metolazone once per week as needed after 1 more dose on Friday.  Continue to elevate legs frequently.  No evidence for volume overload on exam.  Lungs are clear.  #2 history of atrial fibrillation.  Patient on Coumadin.  Continue instructions for Coumadin as per Coumadin clinic.  #3 chronic back pain.  Patient on hydrocodone 3 times daily.  Previous intolerance with Lyrica and gabapentin.  Not a surgical candidate.  Offered referral to pain management but she is not interested at this time.   No follow-ups on file.    Evelena Peat, MD

## 2023-06-08 NOTE — Progress Notes (Signed)
Pt is also seeing PCP today. Pt reports lower leg edema has improved. Pt reports she has been eating turnip greens.   Increase dose today to take 1 1/2 tablets and then continue 1/2 tablet daily except take 1 tablet on Mondays, Wednesday and Fridays. Recheck in 4 weeks.

## 2023-06-09 LAB — BASIC METABOLIC PANEL
BUN: 27 mg/dL — ABNORMAL HIGH (ref 6–23)
CO2: 32 meq/L (ref 19–32)
Calcium: 10 mg/dL (ref 8.4–10.5)
Chloride: 97 meq/L (ref 96–112)
Creatinine, Ser: 1.02 mg/dL (ref 0.40–1.20)
GFR: 50.02 mL/min — ABNORMAL LOW (ref >60.00)
Glucose, Bld: 86 mg/dL (ref 70–99)
Potassium: 3.8 meq/L (ref 3.5–5.1)
Sodium: 141 meq/L (ref 135–145)

## 2023-06-16 ENCOUNTER — Telehealth: Payer: Self-pay | Admitting: Cardiovascular Disease

## 2023-06-16 NOTE — Telephone Encounter (Signed)
Spoke with patient who read the letter she received from [facility] patient assistance. Sounds like she needs to apply for [facility]  Number given to patient to reach out [phone] Advised patient if she gets denial letter from [facility] will fax to [facility] patient assistance for her

## 2023-06-16 NOTE — Telephone Encounter (Signed)
Pt c/o medication issue:  1. Name of Medication: sacubitril-valsartan (ENTRESTO) 24-26 MG   2. How are you currently taking this medication (dosage and times per day)?   Take 1 tablet by mouth 2 (two) times daily.    3. Are you having a reaction (difficulty breathing--STAT)? No  4. What is your medication issue? Pt states she would like to speak with Regis Bill regarding her medication. Please advise

## 2023-06-20 ENCOUNTER — Telehealth: Payer: Self-pay

## 2023-06-20 NOTE — Telephone Encounter (Signed)
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 Advised pt to f/u with dermatology because the last time she had this procedure they did not ask to stop warfarin. Gave pt number to dermatologist. She will f/u with coumadin clinic if warfarin is requested to be stopped. Pt verbalized understanding.

## 2023-06-22 ENCOUNTER — Telehealth: Payer: Self-pay | Admitting: Cardiovascular Disease

## 2023-06-22 NOTE — Telephone Encounter (Signed)
June from [facility] is requesting to speak with RN Regis Bill in regards to this patient and the patient's husband Shannie Berroa (MRN [id number]). June gave her direct line of [phone]. Please advise.

## 2023-06-22 NOTE — Telephone Encounter (Signed)
Returned call to June,   She states the patient and husband came  to see if they could get assistance, they are about $400 over the limit. Asked if she would go ahead and submit applications so that we can get denial letter from [facility] to submit patient assistance.   Attempted to call patient to let them know to watch for denial letter in case they want to try for patient assistance, no answer, unable to leave VM. Will reattempt contact.

## 2023-06-23 ENCOUNTER — Other Ambulatory Visit: Payer: Self-pay | Admitting: Family Medicine

## 2023-06-23 NOTE — Telephone Encounter (Signed)
Returned call to patient, reviewed what patient will need to do when she gets denial letter. Entresto mailed to patient to fill out and bring with her denial for resubmission

## 2023-06-29 ENCOUNTER — Ambulatory Visit (INDEPENDENT_AMBULATORY_CARE_PROVIDER_SITE_OTHER): Payer: Medicare Other | Admitting: Adult Health

## 2023-06-29 ENCOUNTER — Ambulatory Visit: Payer: Medicare Other

## 2023-06-29 ENCOUNTER — Telehealth: Payer: Self-pay | Admitting: Family Medicine

## 2023-06-29 ENCOUNTER — Encounter: Payer: Self-pay | Admitting: Adult Health

## 2023-06-29 VITALS — BP 122/60 | HR 50 | Temp 98.1°F | Ht 63.0 in | Wt 128.0 lb

## 2023-06-29 DIAGNOSIS — Z7901 Long term (current) use of anticoagulants: Secondary | ICD-10-CM | POA: Diagnosis not present

## 2023-06-29 DIAGNOSIS — B356 Tinea cruris: Secondary | ICD-10-CM | POA: Diagnosis not present

## 2023-06-29 LAB — POCT INR: INR: 1.7 — AB (ref 2.0–3.0)

## 2023-06-29 MED ORDER — FLUCONAZOLE 150 MG PO TABS
ORAL_TABLET | ORAL | 0 refills | Status: DC
Start: 2023-06-29 — End: 2023-07-11

## 2023-06-29 NOTE — Telephone Encounter (Signed)
error 

## 2023-06-29 NOTE — Progress Notes (Signed)
Pt is also seeing provider today for acute visit. Pt just prescribed fluconazole for yeast infection today. She will take 1 tablet per week for 3 weeks.  This has a major interaction with warfarin and pt has taken this in the past and has had supratherapeutic INRs following this medication. Pt is subtherapeutic today but weekly dosing has been reduced due to major interaction. Pt will return in 1 week for INR recheck.  Change weekly dose to take 1/2 tablet daily. Recheck in 1 weeks.  Advised pt if any s/s of abnormal bruising or bleeding to go to ER. Pt verbalized understanding.

## 2023-06-29 NOTE — Patient Instructions (Addendum)
Pre visit review using our clinic review tool, if applicable. No additional management support is needed unless otherwise documented below in the visit note.  Change weekly dose to take 1/2 tablet daily. Recheck in 1 weeks.

## 2023-06-29 NOTE — Progress Notes (Signed)
Subjective:    Patient ID: Catherine [name], female    DOB: April 12, 1935, 88 y.o.   MRN: [id number]  Vaginal Itching   88 year old female who  has a past medical history of Acute on chronic diastolic heart failure (HCC), AKI (acute kidney injury) (HCC) (06/22/2022), Aortic stenosis, mild (07/26/2017), Arthritis, Asthma, Atrial fibrillation, chronic (HCC), Cellulitis of left lower extremity (08/17/2016), Chronic anticoagulation, Chronic diastolic CHF (congestive heart failure) (HCC), Chronic kidney disease, COPD (chronic obstructive pulmonary disease) (HCC), Coronary artery disease, Dizziness, H/O: rheumatic fever, Heart murmur, Hypertension, PONV (postoperative nausea and vomiting), S/P Maze operation for atrial fibrillation (10/14/2016), S/P mitral valve replacement with bioprosthetic valve (10/14/2016), Tricuspid regurgitation (07/14/2021), UTI (urinary tract infection), and Valvular heart disease.  She is a patient of Dr. Caryl Never who I am seeing today for an acute issue.   She is concerned that she may have a yeast infection stating " it feels like there are little bugs down there that are causing itching and burning". She also has noticed a " pimple" on the left side of her vagina.   Symptoms have been present for 1 day.   She tried using Vagisil with witchhazel but did not get much relief.   Has not noticed any vaginal discharge    Review of Systems See HPI   Past Medical History:  Diagnosis Date   Acute on chronic diastolic heart failure (HCC)    AKI (acute kidney injury) (HCC) 06/22/2022   Aortic stenosis, mild 07/26/2017   Arthritis    Asthma    last attack 02/2015   Atrial fibrillation, chronic (HCC)    Cellulitis of left lower extremity 08/17/2016   Ulcer associated with severe venous insufficiency   Chronic anticoagulation    Chronic diastolic CHF (congestive heart failure) (HCC)    Chronic kidney disease    "RIGHT MANY KIDNEY INFECTIONS AND STONES"   COPD (chronic  obstructive pulmonary disease) (HCC)    Coronary artery disease    Dizziness    H/O: rheumatic fever    Heart murmur    Hypertension    PONV (postoperative nausea and vomiting)    ' SOMETIMES', BUT NOT ALWAYS"   S/P Maze operation for atrial fibrillation 10/14/2016   Complete bilateral atrial lesion set using cryothermy and bipolar radiofrequency ablation - atrial appendage was not treated due to previous surgical procedure (open mitral commissurotomy)   S/P mitral valve replacement with bioprosthetic valve 10/14/2016   29 mm Medtronic Mosaic porcine bioprosthetic tissue valve   Tricuspid regurgitation 07/14/2021   UTI (urinary tract infection)    Valvular heart disease    Has mitral stenosis with prior mitral commissurotomy in 1970    Social History   Socioeconomic History   Marital status: Married    Spouse name: Not on file   Number of children: Not on file   Years of education: Not on file   Highest education level: Not on file  Occupational History   Not on file  Tobacco Use   Smoking status: Former    Current packs/day: 0.00    Average packs/day: 1 pack/day for 15.0 years (15.0 ttl pk-yrs)    Types: Cigarettes    Start date: 09/27/1960    Quit date: 09/28/1975    Years since quitting: 47.7   Smokeless tobacco: Never  Vaping Use   Vaping status: Never Used  Substance and Sexual Activity   Alcohol use: No    Alcohol/week: 0.0 standard drinks of alcohol   Drug use:  No   Sexual activity: Yes  Other Topics Concern   Not on file  Social History Narrative   Not on file   Social Determinants of Health   Financial Resource Strain: Low Risk  (04/09/2022)   Overall Financial Resource Strain (CARDIA)    Difficulty of Paying Living Expenses: Not hard at all  Food Insecurity: No Food Insecurity (12/22/2022)   Hunger Vital Sign    Worried About Running Out of Food in the Last Year: Never true    Ran Out of Food in the Last Year: Never true  Transportation Needs: No  Transportation Needs (04/09/2022)   PRAPARE - Administrator, Civil Service (Medical): No    Lack of Transportation (Non-Medical): No  Physical Activity: Insufficiently Active (04/09/2022)   Exercise Vital Sign    Days of Exercise per Week: 5 days    Minutes of Exercise per Session: 20 min  Stress: No Stress Concern Present (04/09/2022)   [employer] of Occupational Health - Occupational Stress Questionnaire    Feeling of Stress : Not at all  Social Connections: Socially Integrated (04/09/2022)   Social Connection and Isolation Panel [NHANES]    Frequency of Communication with Friends and Family: More than three times a week    Frequency of Social Gatherings with Friends and Family: More than three times a week    Attends Religious Services: More than 4 times per year    Active Member of Clubs or Organizations: Yes    Attends Banker Meetings: More than 4 times per year    Marital Status: Married  Catering manager Violence: Not At Risk (04/09/2022)   Humiliation, Afraid, Rape, and Kick questionnaire    Fear of Current or Ex-Partner: No    Emotionally Abused: No    Physically Abused: No    Sexually Abused: No    Past Surgical History:  Procedure Laterality Date   ABDOMINAL HYSTERECTOMY  1983   endometriosis   APPENDECTOMY     BACK SURGERY     neurosurgery x2   CARDIAC CATHETERIZATION     CARDIAC CATHETERIZATION N/A 08/03/2016   Procedure: Right/Left Heart Cath and Coronary Angiography;  Surgeon: Peter M Swaziland, MD;  Location: [facility] INVASIVE CV LAB;  Service: Cardiovascular;  Laterality: N/A;   cataract surg     CHOLECYSTECTOMY N/A 05/30/2015   Procedure: LAPAROSCOPIC CHOLECYSTECTOMY WITH INTRAOPERATIVE CHOLANGIOGRAM;  Surgeon: Glenna Fellows, MD;  Location: [facility] OR;  Service: General;  Laterality: N/A;   COLONOSCOPY     CORONARY ARTERY BYPASS GRAFT N/A 10/14/2016   Procedure: CORONARY ARTERY BYPASS GRAFTING (CABG);  Surgeon: Purcell Nails, MD;  Location:  [facility] OR;  Service: Open Heart Surgery;  Laterality: N/A;   EYE SURGERY     MAZE N/A 10/14/2016   Procedure: MAZE;  Surgeon: Purcell Nails, MD;  Location: [facility] OR;  Service: Open Heart Surgery;  Laterality: N/A;   MITRAL VALVE REPLACEMENT N/A 10/14/2016   Procedure: REDO MITRAL VALVE REPLACEMENT (MVR);  Surgeon: Purcell Nails, MD;  Location: [facility] OR;  Service: Open Heart Surgery;  Laterality: N/A;   MITRAL VALVE SURGERY Left 1970   Open mitral commissurotomy via left thoracotomy approach   TEE WITHOUT CARDIOVERSION N/A 07/05/2016   Procedure: TRANSESOPHAGEAL ECHOCARDIOGRAM (TEE);  Surgeon: Chilton Si, MD;  Location: [facility] ENDOSCOPY;  Service: Cardiovascular;  Laterality: N/A;   TEE WITHOUT CARDIOVERSION N/A 10/14/2016   Procedure: TRANSESOPHAGEAL ECHOCARDIOGRAM (TEE);  Surgeon: Purcell Nails, MD;  Location: [facility] OR;  Service:  Open Heart Surgery;  Laterality: N/A;    Family History  Problem Relation Age of Onset   Leukemia Father    Breast cancer Neg Hx     Allergies  Allergen Reactions   Aldactone [Spironolactone] Other (See Comments)    dyspnea   Amoxicillin Palpitations    Tachycardia Has patient had a PCN reaction causing immediate rash, facial/tongue/throat swelling, SOB or lightheadedness with hypotension: no Has patient had a PCN reaction causing severe rash involving mucus membranes or skin necrosis: {no Has patient had a PCN reaction that required hospitalization {no Has patient had a PCN reaction occurring within the last 10 years: {yes If all of the above answers are "NO", then may proceed with Cephalosporin use.   Diltiazem Other (See Comments)    Causing headaches    Flagyl [Metronidazole Hcl] Other (See Comments)    Causing headaches     Flovent [Fluticasone Propionate] Other (See Comments)    Leg cramps   Gabapentin Swelling   Lyrica [Pregabalin] Swelling   Quinidine Diarrhea and Other (See Comments)    Fever diarrhea   Simvastatin Other (See Comments)    Leg  pain, myalgia   Tramadol Nausea Only   Verapamil Other (See Comments)    myalgias   Amlodipine     Low extremity edema   Crestor [Rosuvastatin]     myalgia   Livalo [Pitavastatin]     myalgias   Polymyxin B-Trimethoprim Swelling   Zetia [Ezetimibe]     LEG CRAMPS    Benazepril Hcl Cough   Ciprofloxacin Diarrhea   Codeine Nausea Only   Nitrofurantoin Monohyd Macro Nausea Only    Current Outpatient Medications on File Prior to Visit  Medication Sig Dispense Refill   acetaminophen (TYLENOL) 500 MG tablet Take 1,000 mg by mouth every 6 (six) hours as needed for mild pain.     albuterol (VENTOLIN HFA) 108 (90 Base) MCG/ACT inhaler INHALE 2 PUFFS INTO THE LUNGS EVERY 4 HOURS AS NEEDED FOR AHEEZING OR SHORTNESS OF BREATH 8.5 g 2   bumetanide (BUMEX) 2 MG tablet Please take 2mg  daily in the morning and then 2mg  in the afternoon on MONDAY, [facility] AND FRIDAY 120 tablet 3   cephALEXin (KEFLEX) 500 MG capsule Take 1 capsule (500 mg total) by mouth 3 (three) times daily. 21 capsule 0   cholecalciferol (VITAMIN D3) 25 MCG (1000 UNIT) tablet Take 1,000 Units by mouth daily.     diazepam (VALIUM) 5 MG tablet Take 1 tablet (5 mg total) by mouth at bedtime. 30 tablet 5   estradiol (ESTRACE VAGINAL) 0.1 MG/GM vaginal cream USE 2 grams per vagina daily for 2 weeks and then 1 gram per vagina three times per week. (Patient taking differently: USE 1 gram per vagina three times per week PRN) 42.5 g 11   fluticasone (FLONASE) 50 MCG/ACT nasal spray Place 1 spray into both nostrils daily as needed for allergies or rhinitis. 16 g 2   HYDROcodone-acetaminophen (NORCO/VICODIN) 5-325 MG tablet TAKE 1 TABLET EVERY 8 HOURS 90 tablet 0   hydrocortisone (ANUSOL-HC) 2.5 % rectal cream Place 1 Application rectally 2 (two) times daily. 30 g 3   metolazone (ZAROXOLYN) 5 MG tablet TAKE ONE TABLET AS NEEDED ONCE A WEEK 30 MINUTES BEFORE BUMEX DOSE. AS NEEDED ONLY FOR WEIGHT GAIN 2LBS OVERNIGHT OR 5 LBS IN ONE WEEK 30  tablet 1   metoprolol succinate (TOPROL-XL) 25 MG 24 hr tablet Take 1/2 tablet daily 45 tablet 2   Multiple Vitamins-Minerals (HAIR/SKIN/NAILS/BIOTIN  PO) Take 1 tablet by mouth daily.     Multiple Vitamins-Minerals (MULTIVITAMIN ADULT EXTRA C) CHEW Chew 2 tablets by mouth daily.     phenazopyridine (PYRIDIUM) 200 MG tablet Take 1 tablet (200 mg total) by mouth 3 (three) times daily as needed (urine pain). 60 tablet 0   potassium chloride (KLOR-CON) 10 MEQ tablet TAKE UP TO 8 TABLETS DAILY AS DIRECTED 720 tablet 3   sacubitril-valsartan (ENTRESTO) 24-26 MG Take 1 tablet by mouth 2 (two) times daily. 180 tablet 3   trimethoprim-polymyxin b (POLYTRIM) ophthalmic solution Place 2 drops into both eyes every 4 (four) hours. 10 mL 0   warfarin (COUMADIN) 2 MG tablet TAKE 1 TABLET BY MOUTH DAILY EXCEPT TAKE 1/2 TABLET ON FRIDAYS OR AS DIRECTED BY ANTICOAGULATION CLINIC 100 tablet 1   No current facility-administered medications on file prior to visit.    BP 122/60   Pulse (!) 50   Temp 98.1 F (36.7 C) (Oral)   Ht 5\' 3"  (1.6 m)   Wt 128 lb (58.1 kg)   SpO2 95%   BMI 22.67 kg/m       Objective:   Physical Exam Vitals and nursing note reviewed. Exam conducted with a chaperone present.  Constitutional:      Appearance: Normal appearance.  Cardiovascular:     Rate and Rhythm: Normal rate and regular rhythm.     Pulses: Normal pulses.     Heart sounds: Normal heart sounds.  Pulmonary:     Effort: Pulmonary effort is normal.     Breath sounds: Normal breath sounds.  Genitourinary:    Exam position: Supine.     Labia:        Right: Rash present.      Vagina: No vaginal discharge.       Comments: She has a red flat rash noted on her mons pubis, lower abdomen, sides of her groin and around her vagina.  No pimple-like structure noted Skin:    General: Skin is warm and dry.  Neurological:     General: No focal deficit present.     Mental Status: She is alert and oriented to person,  place, and time.  Psychiatric:        Mood and Affect: Mood normal.        Behavior: Behavior normal.        Thought Content: Thought content normal.        Judgment: Judgment normal.        Assessment & Plan:  1. Tinea cruris -Treat for suspected tinea infection with Diflucan weekly x 3 weeks.  Follow-up if not improving over the next few days. - fluconazole (DIFLUCAN) 150 MG tablet; Take one tablet weekly x 3 weeks  Dispense: 3 tablet; Refill: 0  Shirline Frees, NP

## 2023-07-06 ENCOUNTER — Ambulatory Visit (INDEPENDENT_AMBULATORY_CARE_PROVIDER_SITE_OTHER): Payer: Medicare Other

## 2023-07-06 ENCOUNTER — Telehealth: Payer: Self-pay | Admitting: Cardiovascular Disease

## 2023-07-06 ENCOUNTER — Ambulatory Visit: Payer: Medicare Other

## 2023-07-06 DIAGNOSIS — L570 Actinic keratosis: Secondary | ICD-10-CM | POA: Diagnosis not present

## 2023-07-06 DIAGNOSIS — Z7901 Long term (current) use of anticoagulants: Secondary | ICD-10-CM

## 2023-07-06 DIAGNOSIS — I4891 Unspecified atrial fibrillation: Secondary | ICD-10-CM

## 2023-07-06 DIAGNOSIS — L820 Inflamed seborrheic keratosis: Secondary | ICD-10-CM | POA: Diagnosis not present

## 2023-07-06 DIAGNOSIS — I4821 Permanent atrial fibrillation: Secondary | ICD-10-CM

## 2023-07-06 DIAGNOSIS — Z789 Other specified health status: Secondary | ICD-10-CM | POA: Diagnosis not present

## 2023-07-06 DIAGNOSIS — L538 Other specified erythematous conditions: Secondary | ICD-10-CM | POA: Diagnosis not present

## 2023-07-06 DIAGNOSIS — L2989 Other pruritus: Secondary | ICD-10-CM | POA: Diagnosis not present

## 2023-07-06 LAB — POCT INR: INR: 1.7 — AB (ref 2.0–3.0)

## 2023-07-06 MED ORDER — ENTRESTO 24-26 MG PO TABS: 1.0000 | ORAL_TABLET | Freq: Two times a day (BID) | ORAL | 5 refills | Status: DC

## 2023-07-06 MED ORDER — WARFARIN SODIUM 2 MG PO TABS: ORAL_TABLET | ORAL | 1 refills | Status: DC

## 2023-07-06 NOTE — Patient Instructions (Addendum)
Pre visit review using our clinic review tool, if applicable. No additional management support is needed unless otherwise documented below in the visit note.  Increase dose today to take 1 tablet and then change weekly dose to take 1/2 tablet daily except take 1 tablet on Fridays. Recheck in 2 weeks.

## 2023-07-06 NOTE — Progress Notes (Signed)
Pt was prescribed fluconazole for yeast infection one week ago. She will take 1 tablet per week for 3 weeks.  This has a major interaction with warfarin and pt has taken this in the past and has had supratherapeutic INRs following this medication. Pt was subtherapeutic last week but dosing has been reduced due to major interaction.  Pt will take her second dose of fluconazole this evening. Pt is still subtherapeutic so a small increase in her dosing has been made.  Increase dose today to take 1 tablet and then change weekly dose to take 1/2 tablet daily except take 1 tablet on Fridays. Recheck in 2 weeks.    Pt is compliant with warfarin management and PCP apts.  Sent in refill of warfarin to requested pharmacy.

## 2023-07-06 NOTE — Telephone Encounter (Signed)
Patient brought her patient assistance papers She will bring first 2 pages of tax return and SHIPP denial letter when she gets

## 2023-07-06 NOTE — Addendum Note (Signed)
Addended by: Regis Bill B on: 07/06/2023 04:15 PM   Modules accepted: Orders

## 2023-07-06 NOTE — Addendum Note (Signed)
Addended by: Regis Bill B on: 07/06/2023 04:08 PM   Modules accepted: Orders

## 2023-07-06 NOTE — Telephone Encounter (Signed)
Patient was requesting to speak with Catherine [name]

## 2023-07-06 NOTE — Telephone Encounter (Signed)
Left message to call back  

## 2023-07-07 ENCOUNTER — Encounter: Payer: Self-pay | Admitting: Cardiovascular Disease

## 2023-07-07 NOTE — Telephone Encounter (Signed)
This encounter was created in error - please disregard.

## 2023-07-07 NOTE — Telephone Encounter (Signed)
Patient is returning a call from [place], LPN and is requesting return call.

## 2023-07-11 ENCOUNTER — Encounter: Payer: Self-pay | Admitting: Family Medicine

## 2023-07-11 ENCOUNTER — Ambulatory Visit: Payer: Medicare Other | Admitting: Family Medicine

## 2023-07-11 ENCOUNTER — Ambulatory Visit (INDEPENDENT_AMBULATORY_CARE_PROVIDER_SITE_OTHER): Payer: Medicare Other

## 2023-07-11 VITALS — BP 110/70 | HR 60 | Temp 98.5°F | Resp 16 | Ht 63.0 in | Wt 130.4 lb

## 2023-07-11 DIAGNOSIS — B3731 Acute candidiasis of vulva and vagina: Secondary | ICD-10-CM

## 2023-07-11 DIAGNOSIS — Z7901 Long term (current) use of anticoagulants: Secondary | ICD-10-CM

## 2023-07-11 DIAGNOSIS — T45515A Adverse effect of anticoagulants, initial encounter: Secondary | ICD-10-CM | POA: Diagnosis not present

## 2023-07-11 DIAGNOSIS — R300 Dysuria: Secondary | ICD-10-CM | POA: Diagnosis not present

## 2023-07-11 DIAGNOSIS — N390 Urinary tract infection, site not specified: Secondary | ICD-10-CM

## 2023-07-11 DIAGNOSIS — D6832 Hemorrhagic disorder due to extrinsic circulating anticoagulants: Secondary | ICD-10-CM | POA: Diagnosis not present

## 2023-07-11 LAB — POC URINALSYSI DIPSTICK (AUTOMATED)
Bilirubin, UA: POSITIVE
Blood, UA: NEGATIVE
Glucose, UA: NEGATIVE
Ketones, UA: NEGATIVE
Nitrite, UA: POSITIVE
Protein, UA: POSITIVE — AB
Spec Grav, UA: 1.015 (ref 1.010–1.025)
Urobilinogen, UA: 1 U/dL
pH, UA: 6 (ref 5.0–8.0)

## 2023-07-11 LAB — POCT INR: INR: 2.7 (ref 2.0–3.0)

## 2023-07-11 MED ORDER — FLUCONAZOLE 150 MG PO TABS: 150.0000 mg | ORAL_TABLET | ORAL | 0 refills | Status: AC

## 2023-07-11 MED ORDER — TERCONAZOLE 0.4 % VA CREA: 1.0000 | TOPICAL_CREAM | Freq: Every day | VAGINAL | 0 refills | Status: DC

## 2023-07-11 MED ORDER — CEPHALEXIN 500 MG PO CAPS
500.0000 mg | ORAL_CAPSULE | Freq: Three times a day (TID) | ORAL | 0 refills | Status: AC
Start: 2023-07-11 — End: 2023-07-16

## 2023-07-11 NOTE — Patient Instructions (Addendum)
A few things to remember from today's visit:  Dysuria - Plan: POCT Urinalysis Dipstick (Automated), Culture, Urine, Ambulatory referral to Urology  Recurrent UTI - Plan: cephALEXin (KEFLEX) 500 MG capsule, Ambulatory referral to Urology  Vulvovaginal candidiasis  Tinea cruris - Plan: fluconazole (DIFLUCAN) 150 MG tablet  Warfarin-induced coagulopathy (HCC)  Please call coumadin clinic to arrange appt for INR in 2-3 days.  Do not use My Chart to request refills or for acute issues that need immediate attention. If you send a my chart message, it may take a few days to be addressed, specially if I am not in the office.  Please be sure medication list is accurate. If a new problem present, please set up appointment sooner than planned today.

## 2023-07-11 NOTE — Progress Notes (Signed)
ACUTE VISIT Chief Complaint  Patient presents with   Urinary Tract Infection    Started on Saturday, has been using AZO.    HPI: Ms.Catherine [name] is a 88 y.o. female with a PMHx significant for CHF, aortic stenosis, atrial fibrillation on chronic anticoagulation, CAD, HTN, and COPD, anxiety here today with her husband complaining of UTI symptoms.  She complains of a stabbing sensation on urination that started on the morning of 10/12.   Dysuria  This is a recurrent problem. The current episode started in the past 7 days. The problem has been unchanged. The quality of the pain is described as stabbing. The pain is severe. There has been no fever. She is Not sexually active. There is No history of pyelonephritis. Associated symptoms include hesitancy. Pertinent negatives include no chills, discharge, flank pain, hematuria, nausea, sweats or vomiting. Her past medical history is significant for recurrent UTIs.   She endorses some associated lower abdominal pain. She denies gross hematuria. She says she has UTI's about once every six weeks.  She has not had exacerbating or alleviating factors. Multiple abx allergies.  She denies fever, chills, vaginal discharge, or vaginal bleeding.  She took AZO for her symptoms.  She has not seen urology or gynecology for some time.     Component 2 mo ago  MICRO NUMBER: [id number]  SPECIMEN QUALITY: Adequate  Sample Source NOT GIVEN  STATUS: FINAL  ISOLATE 1: Klebsiella pneumoniae Abnormal   Comment: Greater than 100,000 CFU/mL of Klebsiella pneumoniae  Resulting Agency [facility] [place]     Susceptibility   Klebsiella pneumoniae    URINE CULTURE, REFLEX    AMOX/CLAVULANIC <=2 Sensitive    AMPICILLIN >=32 Resistant    AMPICILLIN/SULBACTAM 4 Sensitive    CEFAZOLIN <=4 Not Reportable 1    CEFEPIME <=1 Sensitive    CEFTAZIDIME <=1 Sensitive    CEFTRIAXONE <=1 Sensitive    CIPROFLOXACIN <=0.25 Sensitive    GENTAMICIN <=1 Sensitive     IMIPENEM <=0.25 Sensitive    LEVOFLOXACIN <=0.12 Sensitive    NITROFURANTOIN 64 Intermediate    PIP/TAZO <=4 Sensitive    TOBRAMYCIN <=1 Sensitive    TRIMETH/SULFA <=20 Sensitive 2               She mentions she had a yeast infection 3 weeks ago due to antibiotic treatment, she completed cephalexin for UTI treatment.  Symptoms resolved with Diflucan 150 mg, she has taken 2 tablets and last one to be taken in 2 days. She also mentions she had a hysterectomy.  She has not noted any external genital lesion and reports that symptoms of yeast infection have resolved.  Review of Systems  Constitutional:  Negative for appetite change, chills and fever.  Cardiovascular:  Negative for leg swelling.  Gastrointestinal:  Negative for nausea and vomiting.  Genitourinary:  Positive for dysuria and hesitancy. Negative for decreased urine volume, flank pain, hematuria and vaginal pain.  Skin:  Negative for rash.  Neurological:  Negative for syncope and weakness.  Psychiatric/Behavioral:  Negative for confusion. The patient is nervous/anxious.   See other pertinent positives and negatives in HPI.  Current Outpatient Medications on File Prior to Visit  Medication Sig Dispense Refill   acetaminophen (TYLENOL) 500 MG tablet Take 1,000 mg by mouth every 6 (six) hours as needed for mild pain.     albuterol (VENTOLIN HFA) 108 (90 Base) MCG/ACT inhaler INHALE 2 PUFFS INTO THE LUNGS EVERY 4 HOURS AS NEEDED FOR AHEEZING OR SHORTNESS OF  BREATH 8.5 g 2   bumetanide (BUMEX) 2 MG tablet Please take 2mg  daily in the morning and then 2mg  in the afternoon on MONDAY, [facility] AND FRIDAY 120 tablet 3   cholecalciferol (VITAMIN D3) 25 MCG (1000 UNIT) tablet Take 1,000 Units by mouth daily.     diazepam (VALIUM) 5 MG tablet Take 1 tablet (5 mg total) by mouth at bedtime. 30 tablet 5   estradiol (ESTRACE VAGINAL) 0.1 MG/GM vaginal cream USE 2 grams per vagina daily for 2 weeks and then 1 gram per vagina three times per  week. (Patient taking differently: USE 1 gram per vagina three times per week PRN) 42.5 g 11   fluticasone (FLONASE) 50 MCG/ACT nasal spray Place 1 spray into both nostrils daily as needed for allergies or rhinitis. 16 g 2   HYDROcodone-acetaminophen (NORCO/VICODIN) 5-325 MG tablet TAKE 1 TABLET EVERY 8 HOURS 90 tablet 0   hydrocortisone (ANUSOL-HC) 2.5 % rectal cream Place 1 Application rectally 2 (two) times daily. 30 g 3   metolazone (ZAROXOLYN) 5 MG tablet TAKE ONE TABLET AS NEEDED ONCE A WEEK 30 MINUTES BEFORE BUMEX DOSE. AS NEEDED ONLY FOR WEIGHT GAIN 2LBS OVERNIGHT OR 5 LBS IN ONE WEEK 30 tablet 1   metoprolol succinate (TOPROL-XL) 25 MG 24 hr tablet Take 1/2 tablet daily 45 tablet 2   Multiple Vitamins-Minerals (HAIR/SKIN/NAILS/BIOTIN PO) Take 1 tablet by mouth daily.     Multiple Vitamins-Minerals (MULTIVITAMIN ADULT EXTRA C) CHEW Chew 2 tablets by mouth daily.     phenazopyridine (PYRIDIUM) 200 MG tablet Take 1 tablet (200 mg total) by mouth 3 (three) times daily as needed (urine pain). 60 tablet 0   potassium chloride (KLOR-CON) 10 MEQ tablet TAKE UP TO 8 TABLETS DAILY AS DIRECTED 720 tablet 3   sacubitril-valsartan (ENTRESTO) 24-26 MG Take 1 tablet by mouth 2 (two) times daily. 60 tablet 5   trimethoprim-polymyxin b (POLYTRIM) ophthalmic solution Place 2 drops into both eyes every 4 (four) hours. 10 mL 0   warfarin (COUMADIN) 2 MG tablet TAKE 1 TABLET BY MOUTH DAILY OR AS DIRECTED BY ANTICOAGULATION CLINIC 100 tablet 1   No current facility-administered medications on file prior to visit.    Past Medical History:  Diagnosis Date   Acute on chronic diastolic heart failure (HCC)    AKI (acute kidney injury) (HCC) 06/22/2022   Aortic stenosis, mild 07/26/2017   Arthritis    Asthma    last attack 02/2015   Atrial fibrillation, chronic (HCC)    Cellulitis of left lower extremity 08/17/2016   Ulcer associated with severe venous insufficiency   Chronic anticoagulation    Chronic  diastolic CHF (congestive heart failure) (HCC)    Chronic kidney disease    "RIGHT MANY KIDNEY INFECTIONS AND STONES"   COPD (chronic obstructive pulmonary disease) (HCC)    Coronary artery disease    Dizziness    H/O: rheumatic fever    Heart murmur    Hypertension    PONV (postoperative nausea and vomiting)    ' SOMETIMES', BUT NOT ALWAYS"   S/P Maze operation for atrial fibrillation 10/14/2016   Complete bilateral atrial lesion set using cryothermy and bipolar radiofrequency ablation - atrial appendage was not treated due to previous surgical procedure (open mitral commissurotomy)   S/P mitral valve replacement with bioprosthetic valve 10/14/2016   29 mm Medtronic Mosaic porcine bioprosthetic tissue valve   Tricuspid regurgitation 07/14/2021   UTI (urinary tract infection)    Valvular heart disease    Has  mitral stenosis with prior mitral commissurotomy in 1970   Allergies  Allergen Reactions   Aldactone [Spironolactone] Other (See Comments)    dyspnea   Amoxicillin Palpitations    Tachycardia Has patient had a PCN reaction causing immediate rash, facial/tongue/throat swelling, SOB or lightheadedness with hypotension: no Has patient had a PCN reaction causing severe rash involving mucus membranes or skin necrosis: {no Has patient had a PCN reaction that required hospitalization {no Has patient had a PCN reaction occurring within the last 10 years: {yes If all of the above answers are "NO", then may proceed with Cephalosporin use.   Diltiazem Other (See Comments)    Causing headaches    Flagyl [Metronidazole Hcl] Other (See Comments)    Causing headaches     Flovent [Fluticasone Propionate] Other (See Comments)    Leg cramps   Gabapentin Swelling   Lyrica [Pregabalin] Swelling   Quinidine Diarrhea and Other (See Comments)    Fever diarrhea   Simvastatin Other (See Comments)    Leg pain, myalgia   Tramadol Nausea Only   Verapamil Other (See Comments)    myalgias    Amlodipine     Low extremity edema   Crestor [Rosuvastatin]     myalgia   Livalo [Pitavastatin]     myalgias   Polymyxin B-Trimethoprim Swelling   Zetia [Ezetimibe]     LEG CRAMPS    Benazepril Hcl Cough   Ciprofloxacin Diarrhea   Codeine Nausea Only   Nitrofurantoin Monohyd Macro Nausea Only    Social History   Socioeconomic History   Marital status: Married    Spouse name: Not on file   Number of children: Not on file   Years of education: Not on file   Highest education level: Not on file  Occupational History   Not on file  Tobacco Use   Smoking status: Former    Current packs/day: 0.00    Average packs/day: 1 pack/day for 15.0 years (15.0 ttl pk-yrs)    Types: Cigarettes    Start date: 09/27/1960    Quit date: 09/28/1975    Years since quitting: 47.8   Smokeless tobacco: Never  Vaping Use   Vaping status: Never Used  Substance and Sexual Activity   Alcohol use: No    Alcohol/week: 0.0 standard drinks of alcohol   Drug use: No   Sexual activity: Yes  Other Topics Concern   Not on file  Social History Narrative   Not on file   Social Determinants of Health   Financial Resource Strain: Low Risk  (04/09/2022)   Overall Financial Resource Strain (CARDIA)    Difficulty of Paying Living Expenses: Not hard at all  Food Insecurity: No Food Insecurity (12/22/2022)   Hunger Vital Sign    Worried About Running Out of Food in the Last Year: Never true    Ran Out of Food in the Last Year: Never true  Transportation Needs: No Transportation Needs (04/09/2022)   PRAPARE - Administrator, Civil Service (Medical): No    Lack of Transportation (Non-Medical): No  Physical Activity: Insufficiently Active (04/09/2022)   Exercise Vital Sign    Days of Exercise per Week: 5 days    Minutes of Exercise per Session: 20 min  Stress: No Stress Concern Present (04/09/2022)   [employer] of Occupational Health - Occupational Stress Questionnaire    Feeling of Stress :  Not at all  Social Connections: Socially Integrated (04/09/2022)   Social Connection and Isolation Panel [NHANES]  Frequency of Communication with Friends and Family: More than three times a week    Frequency of Social Gatherings with Friends and Family: More than three times a week    Attends Religious Services: More than 4 times per year    Active Member of Clubs or Organizations: Yes    Attends Banker Meetings: More than 4 times per year    Marital Status: Married   Vitals:   07/11/23 1418  BP: 110/70  Pulse: 60  Resp: 16  Temp: 98.5 F (36.9 C)  SpO2: 95%   Body mass index is 23.09 kg/m.  Physical Exam Vitals and nursing note reviewed.  Constitutional:      General: She is not in acute distress.    Appearance: She is well-developed.  HENT:     Head: Normocephalic and atraumatic.     Mouth/Throat:     Mouth: Mucous membranes are moist.  Eyes:     Conjunctiva/sclera: Conjunctivae normal.  Cardiovascular:     Rate and Rhythm: Normal rate and regular rhythm.     Pulses:          Dorsalis pedis pulses are 2+ on the right side and 2+ on the left side.     Heart sounds: Murmur (SEM I-II/VI RUSB-LUSB) heard.  Pulmonary:     Effort: Pulmonary effort is normal. No respiratory distress.     Breath sounds: Normal breath sounds.  Abdominal:     Palpations: Abdomen is soft. There is no mass.     Tenderness: There is abdominal tenderness in the suprapubic area. There is no CVA tenderness, right CVA tenderness, left CVA tenderness, guarding or rebound.  Musculoskeletal:     Right lower leg: Edema present.     Left lower leg: Edema present.  Skin:    General: Skin is warm.     Findings: No erythema.  Neurological:     General: No focal deficit present.     Mental Status: She is alert and oriented to person, place, and time.  Psychiatric:        Mood and Affect: Mood is anxious.   ASSESSMENT AND PLAN:  Ms. Charter was seen today for UTI symptoms.    Dysuria We discussed possible etiologies. Problem has been recurrent. Urine dipstick today positive for protein, nitrate, and leukocytes 2+. Urine will be sent for culture. Follow-up in 2 weeks if problem is persistent.  -     POCT Urinalysis Dipstick (Automated) -     Urine Culture; Future -     Ambulatory referral to Urology  Recurrent UTI We discussed possible causes, including atrophic vaginitis. She has multiple antibiotic allergies, so we will repeat cephalexin 500 mg 3 times daily for 5 days. Continue adequate hydration. Monitor for new symptoms. Referral to urologist placed and contact information of urologist in [facility] given. Instructed about warning signs.  -     Cephalexin; Take 1 capsule (500 mg total) by mouth 3 (three) times daily for 5 days.  Dispense: 15 capsule; Refill: 0 -     Ambulatory referral to Urology  Vulvovaginal candidiasis Reported that symptoms have resolved. Instructed to hold on Diflucan and resume it if she has symptoms. Recommend topical terconazole.  -     Fluconazole; Take 1 tablet (150 mg total) by mouth once a week for 2 doses. Take one tablet weekly x 3 weeks  Dispense: 2 tablet; Refill: 0 -     Terconazole; Place 1 applicator vaginally at bedtime.  Dispense:  45 g; Refill: 0  Warfarin-induced coagulopathy (HCC) Instructed to arrange appointment with Coumadin clinic to have INR check in 2 to 3 days due to high risk of interaction between Coumadin, antibiotic, and antimycotic medication.  Return in about 2 weeks (around 07/25/2023) for Recurrent UTI with PCP.  I, Rolla Etienne Wierda, acting as a scribe for Chianna Spirito [place], MD., have documented all relevant documentation on the behalf of Catherine Player [place], MD, as directed by  Catherine Victory [place], MD while in the presence of Catherine Overall [place], MD.   I, Catherine Saville [place], MD, have reviewed all documentation for this visit. The documentation on 07/11/23 for the exam, diagnosis, procedures, and orders are all accurate  and complete.  Catherine Teng G. [place], MD  [facility]. [place] office.

## 2023-07-11 NOTE — Patient Instructions (Addendum)
Pre visit review using our clinic review tool, if applicable. No additional management support is needed unless otherwise documented below in the visit note.  Continue 1/2 tablet daily except take 1 tablet on Fridays.  Recheck in 1 weeks.

## 2023-07-11 NOTE — Progress Notes (Signed)
Pt had OV with Dr. [place] today for UTI and yeast infection. Pt was prescribed fluconazole 3 weeks ago nad has been taking it once weekly. This week is week 3. She was prescribed today two more weeks of fluconazole, also cephalexin and also vaginal cream, terconazole.  Dr. [place] asked that pt f/u for INR check in 2-3 days. Pt lives a long distance from the clinic and asked for INR to be checked today.  Continue 1/2 tablet daily except take 1 tablet on Fridays. Recheck in 1 weeks.

## 2023-07-13 LAB — URINE CULTURE
MICRO NUMBER:: 15590846
SPECIMEN QUALITY:: ADEQUATE

## 2023-07-15 ENCOUNTER — Other Ambulatory Visit (HOSPITAL_BASED_OUTPATIENT_CLINIC_OR_DEPARTMENT_OTHER): Payer: Self-pay | Admitting: Cardiovascular Disease

## 2023-07-19 DIAGNOSIS — N3000 Acute cystitis without hematuria: Secondary | ICD-10-CM | POA: Diagnosis not present

## 2023-07-20 ENCOUNTER — Ambulatory Visit (INDEPENDENT_AMBULATORY_CARE_PROVIDER_SITE_OTHER): Payer: Medicare Other

## 2023-07-20 DIAGNOSIS — Z7901 Long term (current) use of anticoagulants: Secondary | ICD-10-CM | POA: Diagnosis not present

## 2023-07-20 LAB — POCT INR: INR: 2.4 (ref 2.0–3.0)

## 2023-07-20 NOTE — Patient Instructions (Addendum)
Pre visit review using our clinic review tool, if applicable. No additional management support is needed unless otherwise documented below in the visit note.  Continue 1/2 tablet daily except take 1 tablet on Fridays. Recheck in 2 weeks.

## 2023-07-20 NOTE — Progress Notes (Signed)
Pt has been taking fluconazole for 4 weeks. She will continue it for 2 more weeks. Watching INR closely due to major interaction. Pt's warfarin dosing was reduced when the medication was originally prescribed. Pt was also prescribed Keflex last week, no interaction with warfarin. Pt reports she had apt with [facility] and they prescribed a different vaginal cream but she cannot remember the name of it and notes are not available in her chart. She will LVM with name of cream. Continue 1/2 tablet daily except take 1 tablet on Fridays. Recheck in 2 weeks.

## 2023-07-25 ENCOUNTER — Other Ambulatory Visit: Payer: Self-pay | Admitting: Family Medicine

## 2023-07-26 ENCOUNTER — Telehealth: Payer: Self-pay | Admitting: Cardiovascular Disease

## 2023-07-26 NOTE — Telephone Encounter (Signed)
Returned call to patient, no answer unable to leave message.

## 2023-07-26 NOTE — Telephone Encounter (Signed)
Pt states she would like to speak with Regis Bill regarding some medications. Please advise

## 2023-07-27 MED ORDER — ENTRESTO 24-26 MG PO TABS: 1.0000 | ORAL_TABLET | Freq: Two times a day (BID) | ORAL | 3 refills | Status: DC

## 2023-07-27 NOTE — Telephone Encounter (Signed)
Patient dropped off denial from [facility] Filled out patient assistance, waiting for Dr Duke Salvia to sign

## 2023-07-27 NOTE — Addendum Note (Signed)
Addended by: Regis Bill B on: 07/27/2023 04:01 PM   Modules accepted: Orders

## 2023-07-29 NOTE — Telephone Encounter (Signed)
Faxed patient assistance forms for Entresto, confirmation received

## 2023-08-03 ENCOUNTER — Ambulatory Visit (INDEPENDENT_AMBULATORY_CARE_PROVIDER_SITE_OTHER): Payer: Medicare Other

## 2023-08-03 DIAGNOSIS — Z7901 Long term (current) use of anticoagulants: Secondary | ICD-10-CM

## 2023-08-03 LAB — POCT INR: INR: 2.3 (ref 2.0–3.0)

## 2023-08-03 NOTE — Progress Notes (Signed)
Pt took last fluconazole last week.  Pt reported urology prescribed estradiol cream she is to apply twice weekly.  Continue 1/2 tablet daily except take 1 tablet on Fridays. Recheck in 4 weeks.

## 2023-08-03 NOTE — Patient Instructions (Addendum)
Pre visit review using our clinic review tool, if applicable. No additional management support is needed unless otherwise documented below in the visit note.  Continue 1/2 tablet daily except take 1 tablet on Fridays. Re-check in 4 weeks.

## 2023-08-03 NOTE — Telephone Encounter (Signed)
Received notification from [employer] patient approved through 09/26/2024 Advised patients husband, verbalized understanding

## 2023-08-09 DIAGNOSIS — D485 Neoplasm of uncertain behavior of skin: Secondary | ICD-10-CM | POA: Diagnosis not present

## 2023-08-09 DIAGNOSIS — D044 Carcinoma in situ of skin of scalp and neck: Secondary | ICD-10-CM | POA: Diagnosis not present

## 2023-08-17 DIAGNOSIS — D044 Carcinoma in situ of skin of scalp and neck: Secondary | ICD-10-CM | POA: Diagnosis not present

## 2023-08-19 ENCOUNTER — Other Ambulatory Visit: Payer: Self-pay | Admitting: Family

## 2023-08-26 ENCOUNTER — Other Ambulatory Visit: Payer: Self-pay | Admitting: Family Medicine

## 2023-08-29 ENCOUNTER — Ambulatory Visit (HOSPITAL_BASED_OUTPATIENT_CLINIC_OR_DEPARTMENT_OTHER): Payer: Medicare Other | Admitting: Cardiovascular Disease

## 2023-08-29 ENCOUNTER — Encounter (HOSPITAL_BASED_OUTPATIENT_CLINIC_OR_DEPARTMENT_OTHER): Payer: Self-pay | Admitting: Cardiovascular Disease

## 2023-08-29 VITALS — BP 128/60 | HR 53 | Ht 63.0 in | Wt 128.8 lb

## 2023-08-29 DIAGNOSIS — I4811 Longstanding persistent atrial fibrillation: Secondary | ICD-10-CM | POA: Diagnosis not present

## 2023-08-29 DIAGNOSIS — R0609 Other forms of dyspnea: Secondary | ICD-10-CM | POA: Diagnosis not present

## 2023-08-29 DIAGNOSIS — G8929 Other chronic pain: Secondary | ICD-10-CM

## 2023-08-29 DIAGNOSIS — I10 Essential (primary) hypertension: Secondary | ICD-10-CM | POA: Diagnosis not present

## 2023-08-29 DIAGNOSIS — I361 Nonrheumatic tricuspid (valve) insufficiency: Secondary | ICD-10-CM

## 2023-08-29 DIAGNOSIS — Z951 Presence of aortocoronary bypass graft: Secondary | ICD-10-CM

## 2023-08-29 DIAGNOSIS — E782 Mixed hyperlipidemia: Secondary | ICD-10-CM

## 2023-08-29 DIAGNOSIS — M5489 Other dorsalgia: Secondary | ICD-10-CM | POA: Diagnosis not present

## 2023-08-29 DIAGNOSIS — I050 Rheumatic mitral stenosis: Secondary | ICD-10-CM | POA: Diagnosis not present

## 2023-08-29 DIAGNOSIS — M5430 Sciatica, unspecified side: Secondary | ICD-10-CM | POA: Insufficient documentation

## 2023-08-29 HISTORY — DX: Sciatica, unspecified side: M54.30

## 2023-08-29 NOTE — Progress Notes (Signed)
Cardiology Office Note:  .   Date:  08/29/2023  ID:  DANNON DEMSKY, DOB 11-01-1934, MRN [id number] PCP: Kristian Covey, MD  [facility] Providers Cardiologist:  Chilton Si, MD    History of Present Illness: .    SADIELYNN ONION is a 88 y.o. 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 Ms. Christoff was previously a patient of Dr. Patty Sermons. She underwent mitral commissurotomy in the 1970s. On her echocardiogram 02/2014 she had an ejection fraction of 60-65% with moderate to severe mitral regurgitation. There was also mild aortic regurgitation. Her follow-up echo 02/2015 showed an EF of 60-65% with moderate to severe mitral regurgitation, moderate tricuspid regurgitation and elevated pulmonary artery pressures.  Repeat echo 9/11/7 revealed moderate pulmonary stenosis with moderate to severe mitral regurgitation.  She did not have any pulmonary hypertension and her LV was not dilated.  She was referred for left and right heart catheterization. She was found to have severe mitral stenosis with a 13 mmHg mean gradient, MVA 0.99 cm^2. She also had 50% LCx and 80% RCA lesions.  She had a 29 mm bioprosthetic mitral valve, single vessel CABG and MAZE with Dr. Cornelius Moras on 10/14/16.  She was referred for a baseline echo 02/15/17 that revealed LVEF 60-65% with mild aortic stenosis (mean gradient 12 mmHg).  Her bioprosthetic mitral valve was functioning well.     After her surgery Ms. Syler continued to report fatigue.  It was unclear whether she was in atrial fibrillation or sinus rhythm with a long PR interval.  She was referred to the atrial fibrillation clinic where this remained unclear.  However, it was felt that if she were in atrial fibrillation it would not be possible to maintain sinus rhythm.  She developed lower extremity edema. She  stopped amlodipine and this improved. This was switched to irbesartan. She then developed diaphoresis and insomnia that she attributed to this medication. She was switched from lasix to torsemide which helped her abdominal bloating but she still had LE edema. She has followed up with Gillian Shields NP and noted increased LE edema and hip pain. She also reported dizziness and had a repeat echo 06/2021 with LV 60-65%. Her bioprosthetic mitral valve was functioning properly. She had moderate to severe tricuspid regurgitation and PASP was .    She reported some shortness of breath that improved with torsemide. Her at home blood pressure was ranging 110s-150s/50s-60s, averaging in the 120s-130s. On 10/25/2021 she presented to the ED with complaints of fatigue, cough, nausea, fever, congestion, and headache. Her symptoms were thought to have a viral etiology. She was later admitted to the hospital 10/29/2021 and was found to be hypoxic to 84% with activity, BNP 234, and a chest CT revealed right middle lobe atelectasis and small pleural effusions. 2D echo noted preserved EF. She had moderate TR and her bioprosthetic mitral valve was stable. She also reported right lateral chest wall pain with upper back pain. It was suspected she had a COPD exacerbation with mild component of diastolic CHF.    She was hospitalized 12/2020 with acute on chronic diastolic heart failure and COPD.  She was diuresed with IV Lasix and started on low-dose losartan.  In the hospital she was noted to have episodes of A-fib with bradycardia.  Her heart rates dropped into the 40s and she had up to 2.4-second pauses  on metoprolol.  Her metoprolol dose was reduced.  She followed up in advanced heart failure clinic 12/2021 and was feeling much better.  She saw Gillian Shields, NP 01/2022 and her dyspnea was improving.  She was started on amiodarone. It made her feel poorly, she said her hair was falling out, and it affected her coumadin levels.  Amiodarone was discontinued. Torsemide was switched to Bumex. Losartan was switched to [facility]. She was referred to the PREP program, and followed up with [facility] the following month. Her weight was down 8 lbs. She had a good response to metolazone, so it was added once per week.    Due to low BP and overdiureiss, Bumex was reduced to once daily. Metolazone was changed to prn for edema, shortness of breath, or other heart failure symptoms.  At her visit 06/2022 she was not feeling well.  She also noted weight gain and recurrent UTIs.  Therefore she was not started on SGLT2 inhibitor.  Repeat echo 10/2022 revealed LVEF 60-65% with moderately reduced RV function.  Mitral valve prosthesis was appropriately functioning and she had mild AS with mean gradient of 11 mmHg.  Metoprolol was reduced due to bradycardia.  Recently she has been struggling with leg pain.  She had surgery on her back over a decade ago.  Recently she has had more issues with sciatica in both legs.  Sometimes the pain is incapacitating.  She is very frustrated that she has been told she cannot have anymore surgery due to her age.  She was also told that she should stop exercising and could no longer go to the [facility].  From a cardiac standpoint she is been doing well.  She has not had any chest pain and her breathing has been stable.  She denies lower extremity edema, orthopnea, or PND.  She does sometimes have some abdominal swelling.  When this occurs she takes the Bumex and a metolazone.  She only takes the metolazone as needed.  She notes that she recently had a skin cancer removed from her forehead.  She is pleased with the results of her skin cancer removals from the side of her neck and does not even have a noticeable scar.  She notes frequent UTIs in the past.  She saw urology and was started on topical estradiol.  Since then she has not had any more urinary tract infections.  ROS:  As per HPI  Studies Reviewed: .       Echo  11/03/22: 1. Left ventricular ejection fraction, by estimation, is 60 to 65%. The  left ventricle has normal function. The left ventricle has no regional  wall motion abnormalities. Left ventricular diastolic function could not  be evaluated.   2. Right ventricular systolic function is moderately reduced. The right  ventricular size is mildly enlarged. There is mildly elevated pulmonary  artery systolic pressure.   3. Left atrial size was severely dilated.   4. Right atrial size was moderately dilated.   5. The mitral valve has been repaired/replaced. No evidence of mitral  valve regurgitation. The mean mitral valve gradient is 4.0 mmHg. There is  a 29 mm [employer] bioprosthetic valve present in the mitral position. Echo  findings are consistent with  normal structure and function of the mitral valve prosthesis.   6. Tricuspid valve regurgitation is moderate.   7. The aortic valve is tricuspid. There is moderate calcification of the  aortic valve. There is moderate thickening of the aortic valve. Aortic  valve regurgitation  is not visualized. Mild aortic valve stenosis. Aortic  valve area, by VTI measures 2.20  cm. Aortic valve mean gradient measures 11.0 mmHg. Aortic valve Vmax  measures 2.23 m/s.   8. The inferior vena cava is dilated in size with >50% respiratory  variability, suggesting right atrial pressure of 8 mmHg.   9. Cannot exclude a small PFO.   Risk Assessment/Calculations:    CHA2DS2-VASc Score = 6   This indicates a 9.7% annual risk of stroke. The patient's score is based upon: CHF History: 1 HTN History: 1 Diabetes History: 0 Stroke History: 0 Vascular Disease History: 1 Age Score: 2 Gender Score: 1            Physical Exam:    VS:  BP 128/60   Pulse (!) 53   Ht 5\' 3"  (1.6 m)   Wt 128 lb 12.8 oz (58.4 kg)   SpO2 95%   BMI 22.82 kg/m  , BMI Body mass index is 22.82 kg/m. GENERAL:  Well appearing HEENT: Pupils equal round and reactive, fundi not  visualized, oral mucosa unremarkable NECK:  No jugular venous distention, waveform within normal limits, carotid upstroke brisk and symmetric, no bruits, no thyromegaly LUNGS:  Clear to auscultation bilaterally HEART: Bradycardic.  Regular rhythm.  PMI not displaced or sustained,S1 and S2 within normal limits, no S3, no S4, no clicks, no rubs, III/VI systolic murmur at the LUSB ABD:  Flat, positive bowel sounds normal in frequency in pitch, no bruits, no rebound, no guarding, no midline pulsatile mass, no hepatomegaly, no splenomegaly EXT:  2 plus pulses throughout, no edema, no cyanosis no clubbing SKIN:  No rashes no nodules NEURO:  Cranial nerves II through XII grossly intact, motor grossly intact throughout PSYCH:  Cognitively intact, oriented to person place and time   ASSESSMENT AND PLAN: .    # Paroxysmal atrial fibrillation:  # Junctional bradycardia: She continues to have junctional bradycardia.  She is asymptomatic.  Continue metoprolol and warfarin.  # Status post mitral valve replacement: INR managed by her PCP.  Valve function has been stable on echo most recently 10/2022.  # Mild aortic stenosis: Mean gradient 11 mmHg on echo 10/2022.  She is asymptomatic.  Continue to monitor.  # Hypertension: Blood pressure is overall well-controlled on Entresto and metoprolol.  # HFpEF: Volume status has been very stable.  She does a good job of managing her diuretics.  She continues on Bumex daily with an extra dose as needed.  She also takes the metolazone as needed.  She is euvolemic today.  There has been consideration for SGLT2 inhibitors but this has been deferred in the past due to frequent UTIs.  Exam is now better controlled with estradiol.  If she continues to not have any UTIs, would consider Jardiance or Farxiga in the future.  # Sciatica: Prior back surgeries and persistent pain.  She has been managed with hydrocodone but has breakthrough pain daily.  She has been intolerant  to multiple agents in the past.  Will refer her to Dr. Romero Belling to see if there are any other nonsurgical options for pain control.  She is open to injections.    # CAD: # Hyperlipidemia: Status post single-vessel CABG.  She has no ischemic symptoms.  Continue metoprolol and warfarin.  Lipids are very poorly controlled.  She has allergies to rosuvastatin, Potaba statin, and Zetia.  She is not interested in trying any other statins.  We discussed Repatha but she does not think  she would be able to give herself injections.  We will try to get her started on Leqvio. Check fasting lipids/CMP.  # Dispo:  F/u with Gillian Shields, NP in 4 months. F/u with Kelleen Stolze C. Duke Salvia, MD, [facility] in 1 year  Signed, Chilton Si, MD

## 2023-08-29 NOTE — Patient Instructions (Signed)
Medication Instructions:  ONCE PHARMACY TEAM HAS RECEIVED INFORMATION ABOUT THE LEQVIO THEY WILL BE IN TOUCH   *If you need a refill on your cardiac medications before your next appointment, please call your pharmacy*  Lab Work: FASTING LP/CMET WHEN YOU GO TO PCP   If you have labs (blood work) drawn today and your tests are completely normal, you will receive your results only by: MyChart Message (if you have MyChart) OR A paper copy in the mail If you have any lab test that is abnormal or we need to change your treatment, we will call you to review the results.  Testing/Procedures: NONE   Follow-Up: At [facility], you and your health needs are our priority.  As part of our continuing mission to provide you with exceptional heart care, we have created designated Provider Care Teams.  These Care Teams include your primary Cardiologist (physician) and Advanced Practice Providers (APPs -  Physician Assistants and Nurse Practitioners) who all work together to provide you with the care you need, when you need it.  We recommend signing up for the patient portal called "MyChart".  Sign up information is provided on this After Visit Summary.  MyChart is used to connect with patients for Virtual Visits (Telemedicine).  Patients are able to view lab/test results, encounter notes, upcoming appointments, etc.  Non-urgent messages can be sent to your provider as well.   To learn more about what you can do with MyChart, go to [email or web address].    Your next appointment:   4 month(s)  Provider:   Gillian Shields, NP   1 YEAR WITH DR [facility]    Other Instructions  Dr. Romero Belling for non-surgical management of back pain

## 2023-08-31 ENCOUNTER — Other Ambulatory Visit: Payer: Self-pay | Admitting: Family Medicine

## 2023-08-31 ENCOUNTER — Ambulatory Visit (INDEPENDENT_AMBULATORY_CARE_PROVIDER_SITE_OTHER): Payer: Medicare Other

## 2023-08-31 ENCOUNTER — Other Ambulatory Visit: Payer: Medicare Other

## 2023-08-31 DIAGNOSIS — Z7901 Long term (current) use of anticoagulants: Secondary | ICD-10-CM

## 2023-08-31 DIAGNOSIS — E785 Hyperlipidemia, unspecified: Secondary | ICD-10-CM

## 2023-08-31 LAB — COMPREHENSIVE METABOLIC PANEL
ALT: 11 U/L (ref 0–35)
AST: 22 U/L (ref 0–37)
Albumin: 4 g/dL (ref 3.5–5.2)
Alkaline Phosphatase: 70 U/L (ref 39–117)
BUN: 27 mg/dL — ABNORMAL HIGH (ref 6–23)
CO2: 31 meq/L (ref 19–32)
Calcium: 9.8 mg/dL (ref 8.4–10.5)
Chloride: 104 meq/L (ref 96–112)
Creatinine, Ser: 0.93 mg/dL (ref 0.40–1.20)
GFR: 55.8 mL/min — ABNORMAL LOW (ref >60.00)
Glucose, Bld: 100 mg/dL — ABNORMAL HIGH (ref 70–99)
Potassium: 4.7 meq/L (ref 3.5–5.1)
Sodium: 142 meq/L (ref 135–145)
Total Bilirubin: 0.6 mg/dL (ref 0.2–1.2)
Total Protein: 6.8 g/dL (ref 6.0–8.3)

## 2023-08-31 LAB — LIPID PANEL
Cholesterol: 240 mg/dL — ABNORMAL HIGH (ref 0–200)
HDL: 41.9 mg/dL (ref >39.00)
LDL Cholesterol: 157 mg/dL — ABNORMAL HIGH (ref 0–99)
NonHDL: 198.19
Total CHOL/HDL Ratio: 6
Triglycerides: 208 mg/dL — ABNORMAL HIGH (ref 0.0–149.0)
VLDL: 41.6 mg/dL — ABNORMAL HIGH (ref 0.0–40.0)

## 2023-08-31 LAB — POCT INR: INR: 1.2 — AB (ref 2.0–3.0)

## 2023-08-31 NOTE — Patient Instructions (Addendum)
Pre visit review using our clinic review tool, if applicable. No additional management support is needed unless otherwise documented below in the visit note.  Increase dose today to take 1 tablets and then change weekly dose to take 1/2 tablet daily except take 1 tablet on Mondays, Wednesdays and Fridays. Recheck in 1 weeks.

## 2023-08-31 NOTE — Progress Notes (Signed)
Pt started using estradiol cream. This will decrease effects of warfarin.  Increase dose today to take 1 tablets and then change weekly dose to take 1/2 tablet daily except take 1 tablet on Mondays, Wednesdays and Fridays. Recheck in 1 weeks.

## 2023-09-01 ENCOUNTER — Ambulatory Visit: Payer: Medicare Other | Admitting: Family Medicine

## 2023-09-01 DIAGNOSIS — Z0000 Encounter for general adult medical examination without abnormal findings: Secondary | ICD-10-CM | POA: Diagnosis not present

## 2023-09-01 NOTE — Progress Notes (Signed)
PATIENT CHECK-IN and HEALTH RISK ASSESSMENT QUESTIONNAIRE:  -completed by phone/video for upcoming Medicare Preventive Visit  Pre-Visit Check-in: 1)Vitals (height, wt, BP, etc) - record in vitals section for visit on day of visit Request home vitals (wt, BP, etc.) and enter into vitals, THEN update Vital Signs SmartPhrase below at the top of the HPI. See below.  2)Review and Update Medications, Allergies PMH, Surgeries, Social history in Epic 3)Hospitalizations in the last year with date/reason? none  4)Review and Update Care Team (patient's specialists) in Epic 5) Complete PHQ9 in Epic  6) Complete Fall Screening in Epic 7)Review all Health Maintenance Due and order under PCP if not done.  Medicare Wellness Patient Questionnaire:  Answer theses question about your habits: How often do you have a drink containing alcohol?no How many drinks containing alcohol do you have on a typical day when you are drinking?no How often do you have six or more drinks on one occasion?no Have you ever smoked?no On average, how many days per week do you engage in moderate to strenuous exercise (like a brisk walk)? Does walk her house back and forth On average, how many minutes do you engage in exercise at this level? 5-10 minutes  Feels like is eating healthy, has made changes to her diet and has been successful in losing some weight  Answer theses question about your everyday activities: Can you perform most household chores? yes Are you deaf or have significant trouble hearing? no Do you feel that you have a problem with memory? no Do you feel safe at home?yes Last dentist visit? yearly 8. Do you have any difficulty performing your everyday activities?no Are you having any difficulty walking, taking medications on your own, and or difficulty managing daily home needs?no Do you have difficulty walking or climbing stairs?no Do you have difficulty dressing or bathing?no Do you have difficulty doing  errands alone such as visiting a doctor's office or shopping?no Do you currently have any difficulty preparing food and eating?no Do you currently have any difficulty using the toilet?no Do you have any difficulty managing your finances?no Do you have any difficulties with housekeeping of managing your housekeeping?no   Do you have Advanced Directives in place (Living Will, Healthcare Power or Attorney)? yearly   Last eye Exam and location? yearly   Do you currently use prescribed or non-prescribed narcotic or opioid pain medications? She is prescribed norco by her PCP for chronic severe back pain. She only takes two per day as it does help with the pain. Reports is tolerating well and is considering getting back in with her specialist.   Do you have a history or close family history of breast, ovarian, tubal or peritoneal cancer or a family member with BRCA (breast cancer susceptibility 1 and 2) gene mutations?   ----------------------------------------------------------------------------------------------------------------------------------------------------------------------------------------------------------------------  Because this visit was a virtual/telehealth visit, some criteria may be missing or patient reported. Any vitals not documented were not able to be obtained and vitals that have been documented are patient reported.    MEDICARE ANNUAL PREVENTIVE VISIT WITH PROVIDER: (Welcome to Medicare, initial annual wellness or annual wellness exam)  Virtual Visit via Phone Note  I connected with Catherine [name] on 09/01/23 by phone and verified that I am speaking with the correct person using two identifiers.  Location patient: home Location provider:work or home office Persons participating in the virtual visit: patient, provider  Concerns and/or follow up today: other than chronic back pain doing ok. Sees PCP and specialist for this.  See HM section in Epic for other  details of completed HM.    ROS: negative for report of fevers, unintentional weight loss, vision changes, vision loss, hearing loss or change, chest pain, sob, hemoptysis, melena, hematochezia, hematuria, falls, bleeding or bruising, thoughts of suicide or self harm, memory loss  Patient-completed extensive health risk assessment - reviewed and discussed with the patient: See Health Risk Assessment completed with patient prior to the visit either above or in recent phone note. This was reviewed in detailed with the patient today and appropriate recommendations, orders and referrals were placed as needed per Summary below and patient instructions.   Review of Medical History: -PMH, PSH, Family History and current specialty and care providers reviewed and updated and listed below   Patient Care Team: Kristian Covey, MD as PCP - General (Family Medicine) Chilton Si, MD as PCP - Cardiology (Cardiology) Leitha Bleak, Milas Kocher, [facility] (Pharmacist)   Past Medical History:  Diagnosis Date   Acute on chronic diastolic heart failure (HCC)    AKI (acute kidney injury) (HCC) 06/22/2022   Aortic stenosis, mild 07/26/2017   Arthritis    Asthma    last attack 02/2015   Atrial fibrillation, chronic (HCC)    Cellulitis of left lower extremity 08/17/2016   Ulcer associated with severe venous insufficiency   Chronic anticoagulation    Chronic diastolic CHF (congestive heart failure) (HCC)    Chronic kidney disease    "RIGHT MANY KIDNEY INFECTIONS AND STONES"   COPD (chronic obstructive pulmonary disease) (HCC)    Coronary artery disease    Dizziness    H/O: rheumatic fever    Heart murmur    Hypertension    PONV (postoperative nausea and vomiting)    ' SOMETIMES', BUT NOT ALWAYS"   Primary hypertension    S/P Maze operation for atrial fibrillation 10/14/2016   Complete bilateral atrial lesion set using cryothermy and bipolar radiofrequency ablation - atrial appendage was not treated due to  previous surgical procedure (open mitral commissurotomy)   S/P mitral valve replacement with bioprosthetic valve 10/14/2016   29 mm Medtronic Mosaic porcine bioprosthetic tissue valve   Sciatica 08/29/2023   Tricuspid regurgitation 07/14/2021   UTI (urinary tract infection)    Valvular heart disease    Has mitral stenosis with prior mitral commissurotomy in 1970    Past Surgical History:  Procedure Laterality Date   ABDOMINAL HYSTERECTOMY  1983   endometriosis   APPENDECTOMY     BACK SURGERY     neurosurgery x2   CARDIAC CATHETERIZATION     CARDIAC CATHETERIZATION N/A 08/03/2016   Procedure: Right/Left Heart Cath and Coronary Angiography;  Surgeon: Peter M Swaziland, MD;  Location: [facility] INVASIVE CV LAB;  Service: Cardiovascular;  Laterality: N/A;   cataract surg     CHOLECYSTECTOMY N/A 05/30/2015   Procedure: LAPAROSCOPIC CHOLECYSTECTOMY WITH INTRAOPERATIVE CHOLANGIOGRAM;  Surgeon: Glenna Fellows, MD;  Location: [facility] OR;  Service: General;  Laterality: N/A;   COLONOSCOPY     CORONARY ARTERY BYPASS GRAFT N/A 10/14/2016   Procedure: CORONARY ARTERY BYPASS GRAFTING (CABG);  Surgeon: Purcell Nails, MD;  Location: [facility];  Service: Open Heart Surgery;  Laterality: N/A;   EYE SURGERY     MAZE N/A 10/14/2016   Procedure: MAZE;  Surgeon: Purcell Nails, MD;  Location: [facility] OR;  Service: Open Heart Surgery;  Laterality: N/A;   MITRAL VALVE REPLACEMENT N/A 10/14/2016   Procedure: REDO MITRAL VALVE REPLACEMENT (MVR);  Surgeon: Purcell Nails, MD;  Location:  [facility] OR;  Service: Open Heart Surgery;  Laterality: N/A;   MITRAL VALVE SURGERY Left 1970   Open mitral commissurotomy via left thoracotomy approach   TEE WITHOUT CARDIOVERSION N/A 07/05/2016   Procedure: TRANSESOPHAGEAL ECHOCARDIOGRAM (TEE);  Surgeon: Chilton Si, MD;  Location[facility] ENDOSCOPY;  Service: Cardiovascular;  Laterality: N/A;   TEE WITHOUT CARDIOVERSION N/A 10/14/2016   Procedure: TRANSESOPHAGEAL ECHOCARDIOGRAM (TEE);  Surgeon:  Purcell Nails, MD;  Location: [facility] OR;  Service: Open Heart Surgery;  Laterality: N/A;    Social History   Socioeconomic History   Marital status: Married    Spouse name: Not on file   Number of children: Not on file   Years of education: Not on file   Highest education level: Not on file  Occupational History   Not on file  Tobacco Use   Smoking status: Former    Current packs/day: 0.00    Average packs/day: 1 pack/day for 15.0 years (15.0 ttl pk-yrs)    Types: Cigarettes    Start date: 09/27/1960    Quit date: 09/28/1975    Years since quitting: 47.9   Smokeless tobacco: Never  Vaping Use   Vaping status: Never Used  Substance and Sexual Activity   Alcohol use: No    Alcohol/week: 0.0 standard drinks of alcohol   Drug use: No   Sexual activity: Yes  Other Topics Concern   Not on file  Social History Narrative   Not on file   Social Determinants of Health   Financial Resource Strain: Low Risk  (04/09/2022)   Overall Financial Resource Strain (CARDIA)    Difficulty of Paying Living Expenses: Not hard at all  Food Insecurity: No Food Insecurity (12/22/2022)   Hunger Vital Sign    Worried About Running Out of Food in the Last Year: Never true    Ran Out of Food in the Last Year: Never true  Transportation Needs: No Transportation Needs (04/09/2022)   PRAPARE - Administrator, Civil Service (Medical): No    Lack of Transportation (Non-Medical): No  Physical Activity: Insufficiently Active (04/09/2022)   Exercise Vital Sign    Days of Exercise per Week: 5 days    Minutes of Exercise per Session: 20 min  Stress: No Stress Concern Present (04/09/2022)   [employer] of Occupational Health - Occupational Stress Questionnaire    Feeling of Stress : Not at all  Social Connections: Socially Integrated (04/09/2022)   Social Connection and Isolation Panel [NHANES]    Frequency of Communication with Friends and Family: More than three times a week    Frequency of  Social Gatherings with Friends and Family: More than three times a week    Attends Religious Services: More than 4 times per year    Active Member of [employer] or Organizations: Yes    Attends Engineer, structural: More than 4 times per year    Marital Status: Married  Catering manager Violence: Not At Risk (04/09/2022)   Humiliation, Afraid, Rape, and Kick questionnaire    Fear of Current or Ex-Partner: No    Emotionally Abused: No    Physically Abused: No    Sexually Abused: No    Family History  Problem Relation Age of Onset   Leukemia Father    Breast cancer Neg Hx     Current Outpatient Medications on File Prior to Visit  Medication Sig Dispense Refill   acetaminophen (TYLENOL) 500 MG tablet Take 1,000 mg by mouth every 6 (six)  hours as needed for mild pain.     albuterol (VENTOLIN HFA) 108 (90 Base) MCG/ACT inhaler INHALE 2 PUFFS INTO THE LUNGS EVERY 4 HOURS AS NEEDED FOR AHEEZING OR SHORTNESS OF BREATH 8.5 g 2   bumetanide (BUMEX) 2 MG tablet Please take 2mg  daily in the morning and then 2mg  in the afternoon on MONDAY, [facility] AND FRIDAY 120 tablet 3   cholecalciferol (VITAMIN D3) 25 MCG (1000 UNIT) tablet Take 1,000 Units by mouth daily.     diazepam (VALIUM) 5 MG tablet Take 1 tablet (5 mg total) by mouth at bedtime. 30 tablet 5   estradiol (ESTRACE VAGINAL) 0.1 MG/GM vaginal cream USE 2 grams per vagina daily for 2 weeks and then 1 gram per vagina three times per week. (Patient taking differently: USE 1 gram per vagina three times per week PRN) 42.5 g 11   fluticasone (FLONASE) 50 MCG/ACT nasal spray Place 1 spray into both nostrils daily as needed for allergies or rhinitis. 16 g 2   HYDROcodone-acetaminophen (NORCO/VICODIN) 5-325 MG tablet TAKE ONE TABLET EVERY 8 HOURS AS NEEDED FOR PAIN 90 tablet 0   hydrocortisone (ANUSOL-HC) 2.5 % rectal cream Place 1 Application rectally 2 (two) times daily. (Patient not taking: Reported on 08/29/2023) 30 g 3   metolazone (ZAROXOLYN)  5 MG tablet TAKE ONE TABLET AS NEEDED ONCE A WEEK 30 MINUTES BEFORE BUMEX DOSE. AS NEEDED ONLY FOR WEIGHT GAIN 2LBS OVERNIGHT OR 5 LBS IN ONE WEEK 30 tablet 1   metoprolol succinate (TOPROL-XL) 25 MG 24 hr tablet TAKE 1/2 TABLET DAILY 45 tablet 0   Multiple Vitamins-Minerals (HAIR/SKIN/NAILS/BIOTIN PO) Take 1 tablet by mouth daily.     Multiple Vitamins-Minerals (MULTIVITAMIN ADULT EXTRA C) CHEW Chew 2 tablets by mouth daily.     phenazopyridine (PYRIDIUM) 200 MG tablet Take 1 tablet (200 mg total) by mouth 3 (three) times daily as needed (urine pain). (Patient not taking: Reported on 08/29/2023) 60 tablet 0   potassium chloride (KLOR-CON) 10 MEQ tablet TAKE UP TO 8 TABLETS DAILY AS DIRECTED 720 tablet 3   sacubitril-valsartan (ENTRESTO) 24-26 MG Take 1 tablet by mouth 2 (two) times daily. 180 tablet 3   terconazole (TERAZOL 7) 0.4 % vaginal cream Place 1 applicator vaginally at bedtime. (Patient not taking: Reported on 08/29/2023) 45 g 0   trimethoprim-polymyxin b (POLYTRIM) ophthalmic solution Place 2 drops into both eyes every 4 (four) hours. (Patient not taking: Reported on 08/29/2023) 10 mL 0   warfarin (COUMADIN) 2 MG tablet TAKE 1 TABLET BY MOUTH DAILY OR AS DIRECTED BY ANTICOAGULATION CLINIC 100 tablet 1   No current facility-administered medications on file prior to visit.    Allergies  Allergen Reactions   Aldactone [Spironolactone] Other (See Comments)    dyspnea   Amoxicillin Palpitations    Tachycardia Has patient had a PCN reaction causing immediate rash, facial/tongue/throat swelling, SOB or lightheadedness with hypotension: no Has patient had a PCN reaction causing severe rash involving mucus membranes or skin necrosis: {no Has patient had a PCN reaction that required hospitalization {no Has patient had a PCN reaction occurring within the last 10 years: {yes If all of the above answers are "NO", then may proceed with Cephalosporin use.   Diltiazem Other (See Comments)     Causing headaches    Flagyl [Metronidazole Hcl] Other (See Comments)    Causing headaches     Flovent [Fluticasone Propionate] Other (See Comments)    Leg cramps   Gabapentin Swelling   Lyrica [Pregabalin]  Swelling   Quinidine Diarrhea and Other (See Comments)    Fever diarrhea   Simvastatin Other (See Comments)    Leg pain, myalgia   Tramadol Nausea Only   Verapamil Other (See Comments)    myalgias   Amlodipine     Low extremity edema   Crestor [Rosuvastatin]     myalgia   Livalo [Pitavastatin]     myalgias   Polymyxin B-Trimethoprim Swelling   Zetia [Ezetimibe]     LEG CRAMPS    Benazepril Hcl Cough   Ciprofloxacin Diarrhea   Codeine Nausea Only   Nitrofurantoin Monohyd Macro Nausea Only       Physical Exam Vitals requested from patient and listed below if patient had equipment and was able to obtain at home for this virtual visit: There were no vitals filed for this visit. Estimated body mass index is 22.82 kg/m as calculated from the following:   Height as of 08/29/23: 5\' 3"  (1.6 m).   Weight as of 08/29/23: 128 lb 12.8 oz (58.4 kg).  EKG (optional): deferred due to virtual visit  GENERAL: alert, oriented, no acute distress detected, full vision exam deferred due to pandemic and/or virtual encounter  PSYCH/NEURO: pleasant and cooperative, no obvious depression or anxiety, speech and thought processing grossly intact, Cognitive function grossly intact  Flowsheet Row Office Visit from 10/11/2022 in [facility]  PHQ-9 Total Score 10           09/01/2023    4:18 PM 04/04/2023    4:13 PM 10/11/2022   10:49 AM 04/09/2022   12:42 PM 11/06/2021   11:01 AM  Depression screen PHQ 2/9  Decreased Interest 0  2 0 0  Down, Depressed, Hopeless 1  1 0 0  PHQ - 2 Score 1  3 0 0  Altered sleeping  1 1    Tired, decreased energy  1 1    Change in appetite  0 1    Feeling bad or failure about yourself   2 1    Trouble concentrating  1 3     Moving slowly or fidgety/restless  1 0    Suicidal thoughts  0 0    PHQ-9 Score   10    Difficult doing work/chores  Somewhat difficult Somewhat difficult    Reports is related to the chronic pain which has limited her activity.      08/03/2022    1:56 PM 10/11/2022   10:49 AM 02/28/2023    2:44 PM 04/04/2023    4:13 PM 09/01/2023    4:16 PM  Fall Risk  Falls in the past year? 0 0 0 1 0  Was there an injury with Fall? 0 0 0 0 0  Fall Risk Category Calculator 0 0 0  0  Fall Risk Category (Retired) Low      (RETIRED) Patient Fall Risk Level Low fall risk      Patient at Risk for Falls Due to No Fall Risks No Fall Risks  No Fall Risks   Fall risk Follow up Falls evaluation completed Falls evaluation completed Falls evaluation completed Falls evaluation completed      SUMMARY AND PLAN:  Encounter for Medicare annual wellness exam   Discussed applicable health maintenance/preventive health measures and advised and referred or ordered per patient preferences: -she is considering vaccine due, declined covid though Health Maintenance  Topic Date Due   DTaP/Tdap/Td (2 - Td or Tdap) 05/04/2022   COVID-19 Vaccine (4 -  2023-24 season) 09/17/2023 (Originally 05/29/2023)   Medicare Annual Wellness (AWV)  08/31/2024   Pneumonia Vaccine 30+ Years old  Completed   INFLUENZA VACCINE  Completed   DEXA SCAN  Completed   Zoster Vaccines- Shingrix  Completed   HPV VACCINES  Aged Out      Education and counseling on the following was provided based on the above review of health and a plan/checklist for the patient, along with additional information discussed, was provided for the patient in the patient instructions :  -Advised and counseled on a healthy lifestyle - including the importance of a healthy diet, regular physical activity, social connections  -Reviewed patient's current diet. Advised and counseled on a whole foods based healthy diet. A summary of a healthy diet was provided in the  Patient Instructions.  -reviewed patient's current physical activity level and discussed exercise guidelines for adults. Discussed community resources and ideas for safe exercise at home to assist in meeting exercise guideline recommendations in a safe and healthy way.  -Advise yearly dental visits at minimum and regular eye exams -she see PCP soon to discuss chronic pain, reports doesn't think the norco helps much so discussed potentially seeing if she should come off or adjust therapy when she sees PCP.   Follow up: see patient instructions     Patient Instructions  I really enjoyed getting to talk with you today! I am available on Tuesdays and Thursdays for virtual visits if you have any questions or concerns, or if I can be of any further assistance.   CHECKLIST FROM ANNUAL WELLNESS VISIT:  -Follow up (please call to schedule if not scheduled after visit):   -yearly for annual wellness visit with primary care office  Here is a list of your preventive care/health maintenance measures and the plan for each if any are due:  PLAN For any measures below that may be due:    Health Maintenance  Topic Date Due   DTaP/Tdap/Td (2 - Td or Tdap) 05/04/2022   COVID-19 Vaccine (4 - 2023-24 season) 09/17/2023 (Originally 05/29/2023)   Medicare Annual Wellness (AWV)  08/31/2024   Pneumonia Vaccine 54+ Years old  Completed   INFLUENZA VACCINE  Completed   DEXA SCAN  Completed   Zoster Vaccines- Shingrix  Completed   HPV VACCINES  Aged Out    -See a dentist at least yearly  -Get your eyes checked and then per your eye specialist's recommendations  -Other issues addressed today:   -I have included below further information regarding a healthy whole foods based diet, physical activity guidelines for adults, stress management and opportunities for social connections. I hope you find this information useful.    -----------------------------------------------------------------------------------------------------------------------------------------------------------------------------------------------------------------------------------------------------------  NUTRITION: -eat real food: lots of colorful vegetables (half the plate) and fruits -5-7 servings of vegetables and fruits per day (fresh or steamed is best), exp. 2 servings of vegetables with lunch and dinner and 2 servings of fruit per day. Berries and greens such as kale and collards are great choices.  -consume on a regular basis: whole grains (make sure first ingredient on label contains the word "whole"), fresh fruits, fish, nuts, seeds, healthy oils (such as olive oil, avocado oil, grape seed oil) -may eat small amounts of dairy and lean meat on occasion, but avoid processed meats such as ham, bacon, lunch meat, etc. -drink water -try to avoid fast food and pre-packaged foods, processed meat -most experts advise limiting sodium to < 2300mg  per day, should limit further is any chronic conditions  such as high blood pressure, heart disease, diabetes, etc. The American Heart Association advised that < 1500mg  is is ideal -try to avoid foods that contain any ingredients with names you do not recognize  -try to avoid sugar/sweets (except for the natural sugar that occurs in fresh fruit) -try to avoid sweet drinks -try to avoid white rice, white bread, pasta (unless whole grain), white or yellow potatoes  EXERCISE GUIDELINES FOR ADULTS: -if you wish to increase your physical activity, do so gradually and with the approval of your doctor -STOP and seek medical care immediately if you have any chest pain, chest discomfort or trouble breathing when starting or increasing exercise  -move and stretch your body, legs, feet and arms when sitting for long periods -Physical activity guidelines for optimal health in adults: -least 150 minutes per week of  aerobic exercise (can talk, but not sing) once approved by your doctor, 20-30 minutes of sustained activity or two 10 minute episodes of sustained activity every day.  -resistance training at least 2 days per week if approved by your doctor -balance exercises 3+ days per week:   Stand somewhere where you have something sturdy to hold onto if you lose balance.    1) lift up on toes, start with 5x per day and work up to 20x   2) stand and lift on leg straight out to the side so that foot is a few inches of the floor, start with 5x each side and work up to 20x each side   3) stand on one foot, start with 5 seconds each side and work up to 20 seconds on each side  If you need ideas or help with getting more active:  -Silver sneakers https://tools.silversneakers.com  -Walk with a Doc: http://www.duncan-williams.com/  -try to include resistance (weight lifting/strength building) and balance exercises twice per week: or the following link for ideas: http://castillo-powell.com/  BuyDucts.dk  STRESS MANAGEMENT: -can try meditating, or just sitting quietly with deep breathing while intentionally relaxing all parts of your body for 5 minutes daily -if you need further help with stress, anxiety or depression please follow up with your primary doctor or contact the wonderful folks at [facility]: [phone]  SOCIAL CONNECTIONS: -options in [place] if you wish to engage in more social and exercise related activities:  -Silver sneakers https://tools.silversneakers.com  -Walk with a Doc: http://www.duncan-williams.com/  -Check out the [facility] Active Adults 50+ section on the [place] [employer] (hiking clubs, book clubs, cards and games, chess, exercise classes, aquatic classes and much more) - see the website for  details: https://www.[place].gov/departments/parks-recreation/active-adults50  -YouTube has lots of exercise videos for different ages and abilities as well  -[facility] (a variety of indoor and outdoor inperson activities for adults). 606-030-7279. [address]  -Virtual Online Classes (a variety of topics): see seniorplanet.org or call [phone]  -consider volunteering at a school, hospice center, church, senior center or elsewhere           Terressa Koyanagi, DO

## 2023-09-01 NOTE — Patient Instructions (Signed)
I really enjoyed getting to talk with you today! I am available on Tuesdays and Thursdays for virtual visits if you have any questions or concerns, or if I can be of any further assistance.   CHECKLIST FROM ANNUAL WELLNESS VISIT:  -Follow up (please call to schedule if not scheduled after visit):   -yearly for annual wellness visit with primary care office  Here is a list of your preventive care/health maintenance measures and the plan for each if any are due:  PLAN For any measures below that may be due:    Health Maintenance  Topic Date Due   DTaP/Tdap/Td (2 - Td or Tdap) 05/04/2022   COVID-19 Vaccine (4 - 2023-24 season) 09/17/2023 (Originally 05/29/2023)   Medicare Annual Wellness (AWV)  08/31/2024   Pneumonia Vaccine 24+ Years old  Completed   INFLUENZA VACCINE  Completed   DEXA SCAN  Completed   Zoster Vaccines- Shingrix  Completed   HPV VACCINES  Aged Out    -See a dentist at least yearly  -Get your eyes checked and then per your eye specialist's recommendations  -Other issues addressed today:   -I have included below further information regarding a healthy whole foods based diet, physical activity guidelines for adults, stress management and opportunities for social connections. I hope you find this information useful.   -----------------------------------------------------------------------------------------------------------------------------------------------------------------------------------------------------------------------------------------------------------  NUTRITION: -eat real food: lots of colorful vegetables (half the plate) and fruits -5-7 servings of vegetables and fruits per day (fresh or steamed is best), exp. 2 servings of vegetables with lunch and dinner and 2 servings of fruit per day. Berries and greens such as kale and collards are great choices.  -consume on a regular basis: whole grains (make sure first ingredient on label contains the word  "whole"), fresh fruits, fish, nuts, seeds, healthy oils (such as olive oil, avocado oil, grape seed oil) -may eat small amounts of dairy and lean meat on occasion, but avoid processed meats such as ham, bacon, lunch meat, etc. -drink water -try to avoid fast food and pre-packaged foods, processed meat -most experts advise limiting sodium to < 2300mg  per day, should limit further is any chronic conditions such as high blood pressure, heart disease, diabetes, etc. The American Heart Association advised that < 1500mg  is is ideal -try to avoid foods that contain any ingredients with names you do not recognize  -try to avoid sugar/sweets (except for the natural sugar that occurs in fresh fruit) -try to avoid sweet drinks -try to avoid white rice, white bread, pasta (unless whole grain), white or yellow potatoes  EXERCISE GUIDELINES FOR ADULTS: -if you wish to increase your physical activity, do so gradually and with the approval of your doctor -STOP and seek medical care immediately if you have any chest pain, chest discomfort or trouble breathing when starting or increasing exercise  -move and stretch your body, legs, feet and arms when sitting for long periods -Physical activity guidelines for optimal health in adults: -least 150 minutes per week of aerobic exercise (can talk, but not sing) once approved by your doctor, 20-30 minutes of sustained activity or two 10 minute episodes of sustained activity every day.  -resistance training at least 2 days per week if approved by your doctor -balance exercises 3+ days per week:   Stand somewhere where you have something sturdy to hold onto if you lose balance.    1) lift up on toes, start with 5x per day and work up to 20x   2) stand and lift  on leg straight out to the side so that foot is a few inches of the floor, start with 5x each side and work up to 20x each side   3) stand on one foot, start with 5 seconds each side and work up to 20 seconds on  each side  If you need ideas or help with getting more active:  -Silver sneakers https://tools.silversneakers.com  -Walk with a Doc: http://www.duncan-williams.com/  -try to include resistance (weight lifting/strength building) and balance exercises twice per week: or the following link for ideas: http://castillo-powell.com/  BuyDucts.dk  STRESS MANAGEMENT: -can try meditating, or just sitting quietly with deep breathing while intentionally relaxing all parts of your body for 5 minutes daily -if you need further help with stress, anxiety or depression please follow up with your primary doctor or contact the wonderful folks at [facility]: [phone]  SOCIAL CONNECTIONS: -options in [place] if you wish to engage in more social and exercise related activities:  -Silver sneakers https://tools.silversneakers.com  -Walk with a Doc: http://www.duncan-williams.com/  -Check out the [facility] Active Adults 50+ section on the [place] [employer] (hiking clubs, book clubs, cards and games, chess, exercise classes, aquatic classes and much more) - see the website for details: https://www.[place].gov/departments/parks-recreation/active-adults50  -YouTube has lots of exercise videos for different ages and abilities as well  -[facility] (a variety of indoor and outdoor inperson activities for adults). ([phone]. [address].  -Virtual Online Classes (a variety of topics): see seniorplanet.org or call [phone]  -consider volunteering at a school, hospice center, church, senior center or elsewhere

## 2023-09-07 ENCOUNTER — Ambulatory Visit (INDEPENDENT_AMBULATORY_CARE_PROVIDER_SITE_OTHER): Payer: Medicare Other | Admitting: Family Medicine

## 2023-09-07 ENCOUNTER — Encounter: Payer: Self-pay | Admitting: Family Medicine

## 2023-09-07 ENCOUNTER — Ambulatory Visit (INDEPENDENT_AMBULATORY_CARE_PROVIDER_SITE_OTHER): Payer: Medicare Other

## 2023-09-07 VITALS — BP 124/60 | HR 53 | Temp 98.1°F | Ht 63.0 in | Wt 130.0 lb

## 2023-09-07 DIAGNOSIS — M48061 Spinal stenosis, lumbar region without neurogenic claudication: Secondary | ICD-10-CM

## 2023-09-07 DIAGNOSIS — G8929 Other chronic pain: Secondary | ICD-10-CM | POA: Diagnosis not present

## 2023-09-07 DIAGNOSIS — I4811 Longstanding persistent atrial fibrillation: Secondary | ICD-10-CM

## 2023-09-07 DIAGNOSIS — M545 Low back pain, unspecified: Secondary | ICD-10-CM | POA: Diagnosis not present

## 2023-09-07 DIAGNOSIS — Z789 Other specified health status: Secondary | ICD-10-CM

## 2023-09-07 DIAGNOSIS — Z7901 Long term (current) use of anticoagulants: Secondary | ICD-10-CM

## 2023-09-07 DIAGNOSIS — I2510 Atherosclerotic heart disease of native coronary artery without angina pectoris: Secondary | ICD-10-CM

## 2023-09-07 LAB — POCT INR: INR: 1.2 — AB (ref 2.0–3.0)

## 2023-09-07 NOTE — Progress Notes (Unsigned)
Established Patient Office Visit  Subjective   Patient ID: Catherine [name], female    DOB: 03-14-1935  Age: 88 y.o. MRN: [id number]  Chief Complaint  Patient presents with   Back Pain    HPI  {History (Optional):23778} Catherine [name] is her to discuss her chronic back pain.  She has seen multiple orthopedist and back specialists in the past.  Couple years ago she was seeing physiatrist and had had multiple back injections but was not seeing much improvement.  She states she was told there was not much else they could offer her.  We have been following her since then.  She has daily persistent back pain.  Spends much of her time sitting and has difficulty with achy back discomfort which is bilateral with basically any movement.  She has a lot of back stiffness in the morning which does get somewhat improved with mobility but her pain never goes away.  Her husband states that she is complaining about her back pain constantly.  She is currently on hydrocodone 5 mg 3 times daily.  She has taken for years diazepam at night and we have cautioned her about combination of benzos and opioids.  She is very limited in what she can do because of her back pain and feels that her quality of life is very poor at this time.  Last plain films we obtained were back in May 2023 with compression deformity L1.  She has had multiple MRI scans through neurosurgery in the past.  Multiple prior back surgeries.  No recent urine or stool incontinence.  She has hyperlipidemia and intolerant of multiple statins.  Saw cardiologist recently.  They discussed possible use of Leqvio.    Past Medical History:  Diagnosis Date   Acute on chronic diastolic heart failure (HCC)    AKI (acute kidney injury) (HCC) 06/22/2022   Aortic stenosis, mild 07/26/2017   Arthritis    Asthma    last attack 02/2015   Atrial fibrillation, chronic (HCC)    Cellulitis of left lower extremity 08/17/2016   Ulcer associated with severe venous  insufficiency   Chronic anticoagulation    Chronic diastolic CHF (congestive heart failure) (HCC)    Chronic kidney disease    "RIGHT MANY KIDNEY INFECTIONS AND STONES"   COPD (chronic obstructive pulmonary disease) (HCC)    Coronary artery disease    Dizziness    H/O: rheumatic fever    Heart murmur    Hypertension    PONV (postoperative nausea and vomiting)    ' SOMETIMES', BUT NOT ALWAYS"   Primary hypertension    S/P Maze operation for atrial fibrillation 10/14/2016   Complete bilateral atrial lesion set using cryothermy and bipolar radiofrequency ablation - atrial appendage was not treated due to previous surgical procedure (open mitral commissurotomy)   S/P mitral valve replacement with bioprosthetic valve 10/14/2016   29 mm Medtronic Mosaic porcine bioprosthetic tissue valve   Sciatica 08/29/2023   Tricuspid regurgitation 07/14/2021   UTI (urinary tract infection)    Valvular heart disease    Has mitral stenosis with prior mitral commissurotomy in 1970   Past Surgical History:  Procedure Laterality Date   ABDOMINAL HYSTERECTOMY  1983   endometriosis   APPENDECTOMY     BACK SURGERY     neurosurgery x2   CARDIAC CATHETERIZATION     CARDIAC CATHETERIZATION N/A 08/03/2016   Procedure: Right/Left Heart Cath and Coronary Angiography;  Surgeon: Peter M Swaziland, MD;  Location: [facility];  Service: Cardiovascular;  Laterality: N/A;   cataract surg     CHOLECYSTECTOMY N/A 05/30/2015   Procedure: LAPAROSCOPIC CHOLECYSTECTOMY WITH INTRAOPERATIVE CHOLANGIOGRAM;  Surgeon: Glenna Fellows, MD;  Location: [facility] OR;  Service: General;  Laterality: N/A;   COLONOSCOPY     CORONARY ARTERY BYPASS GRAFT N/A 10/14/2016   Procedure: CORONARY ARTERY BYPASS GRAFTING (CABG);  Surgeon: Purcell Nails, MD;  Location: [facility] OR;  Service: Open Heart Surgery;  Laterality: N/A;   EYE SURGERY     MAZE N/A 10/14/2016   Procedure: MAZE;  Surgeon: Purcell Nails, MD;  Location[facility] OR;  Service: Open  Heart Surgery;  Laterality: N/A;   MITRAL VALVE REPLACEMENT N/A 10/14/2016   Procedure: REDO MITRAL VALVE REPLACEMENT (MVR);  Surgeon: Purcell Nails, MD;  Location: [facility] OR;  Service: Open Heart Surgery;  Laterality: N/A;   MITRAL VALVE SURGERY Left 1970   Open mitral commissurotomy via left thoracotomy approach   TEE WITHOUT CARDIOVERSION N/A 07/05/2016   Procedure: TRANSESOPHAGEAL ECHOCARDIOGRAM (TEE);  Surgeon: Chilton Si, MD;  Location: [facility] ENDOSCOPY;  Service: Cardiovascular;  Laterality: N/A;   TEE WITHOUT CARDIOVERSION N/A 10/14/2016   Procedure: TRANSESOPHAGEAL ECHOCARDIOGRAM (TEE);  Surgeon: Purcell Nails, MD;  Location: [facility] OR;  Service: Open Heart Surgery;  Laterality: N/A;    reports that she quit smoking about 47 years ago. Her smoking use included cigarettes. She started smoking about 62 years ago. She has a 15 pack-year smoking history. She has never used smokeless tobacco. She reports that she does not drink alcohol and does not use drugs. family history includes Leukemia in her father. Allergies  Allergen Reactions   Aldactone [Spironolactone] Other (See Comments)    dyspnea   Amoxicillin Palpitations    Tachycardia Has patient had a PCN reaction causing immediate rash, facial/tongue/throat swelling, SOB or lightheadedness with hypotension: no Has patient had a PCN reaction causing severe rash involving mucus membranes or skin necrosis: {no Has patient had a PCN reaction that required hospitalization {no Has patient had a PCN reaction occurring within the last 10 years: {yes If all of the above answers are "NO", then may proceed with Cephalosporin use.   Diltiazem Other (See Comments)    Causing headaches    Flagyl [Metronidazole Hcl] Other (See Comments)    Causing headaches     Flovent [Fluticasone Propionate] Other (See Comments)    Leg cramps   Gabapentin Swelling   Lyrica [Pregabalin] Swelling   Quinidine Diarrhea and Other (See Comments)    Fever diarrhea    Simvastatin Other (See Comments)    Leg pain, myalgia   Tramadol Nausea Only   Verapamil Other (See Comments)    myalgias   Amlodipine     Low extremity edema   Crestor [Rosuvastatin]     myalgia   Livalo [Pitavastatin]     myalgias   Polymyxin B-Trimethoprim Swelling   Zetia [Ezetimibe]     LEG CRAMPS    Benazepril Hcl Cough   Ciprofloxacin Diarrhea   Codeine Nausea Only   Nitrofurantoin Monohyd Macro Nausea Only    Review of Systems  Constitutional:  Negative for chills and fever.  Cardiovascular:  Negative for chest pain.  Genitourinary:  Negative for dysuria.  Musculoskeletal:  Positive for back pain.      Objective:     BP 124/60 (BP Location: Left Arm, Patient Position: Sitting, Cuff Size: Normal)   Pulse (!) 53   Temp 98.1 F (36.7 C) (Oral)   Ht 5\' 3"  (1.6  m)   Wt 130 lb (59 kg)   SpO2 95%   BMI 23.03 kg/m  BP Readings from Last 3 Encounters:  09/07/23 124/60  08/29/23 128/60  07/11/23 110/70   Wt Readings from Last 3 Encounters:  09/07/23 130 lb (59 kg)  08/29/23 128 lb 12.8 oz (58.4 kg)  07/11/23 130 lb 6 oz (59.1 kg)      Physical Exam Vitals reviewed.  Constitutional:      General: She is not in acute distress.    Appearance: She is not ill-appearing.  Cardiovascular:     Rate and Rhythm: Normal rate and regular rhythm.     Heart sounds: Murmur heard.  Pulmonary:     Effort: Pulmonary effort is normal.     Breath sounds: Normal breath sounds.  Musculoskeletal:     Comments: No spinal tenderness to palpation  Neurological:     Mental Status: She is alert.      Results for orders placed or performed in visit on 09/07/23  POCT INR  Result Value Ref Range   INR 1.2 (A) 2.0 - 3.0    {Labs (Optional):23779}  The ASCVD Risk score (Arnett DK, et al., 2019) failed to calculate for the following reasons:   The 2019 ASCVD risk score is only valid for ages 86 to 21    Assessment & Plan:   #1 chronic back pain.  History of lumbar  stenosis.  She is not a surgical candidate with her chronic heart issue, age, multiple comorbidities.  Has had minimal improvement previously with injection therapy but we have recommended that she consider follow-up with physiatrist.  We also discussed possible referral to chronic pain management.  She is currently on hydrocodone.  Would be leery of increasing her opioids if she is to remain on diazepam-which she has taken for many years for chronic insomnia. -It appears that her cardiologist placed referral to local physiatrist and she will wait to hear back regarding that  #2 dyslipidemia.  Intolerant of multiple statins.  Patient does not feel like she could administer PCSK9 inhibitors.  There have been discussion of Leqvio per cardiology  Evelena Peat, MD

## 2023-09-07 NOTE — Progress Notes (Signed)
Pt also has a PCP apt today. Pt is still using estradiol cream. This will decrease effects of warfarin. Pt denies any other changes except a drastic reduction in edema. Increase dose today to take 1 1/2 tablets and increase dose tomorrow to take 1 1/2 tablets and then change weekly dose to take 1 tablet daily except take 1/2 tablet on Mondays and Thursdays. Recheck in 1 weeks.

## 2023-09-07 NOTE — Patient Instructions (Addendum)
Pre visit review using our clinic review tool, if applicable. No additional management support is needed unless otherwise documented below in the visit note.  Increase dose today to take 1 1/2 tablets and increase dose tomorrow to take 1 1/2 tablets and then change weekly dose to take 1 tablet daily except take 1/2 tablet on Mondays and Thursdays. Recheck in 1 weeks.

## 2023-09-08 DIAGNOSIS — Z789 Other specified health status: Secondary | ICD-10-CM | POA: Insufficient documentation

## 2023-09-10 ENCOUNTER — Emergency Department (HOSPITAL_BASED_OUTPATIENT_CLINIC_OR_DEPARTMENT_OTHER): Payer: Medicare Other

## 2023-09-10 ENCOUNTER — Other Ambulatory Visit: Payer: Self-pay

## 2023-09-10 ENCOUNTER — Inpatient Hospital Stay (HOSPITAL_BASED_OUTPATIENT_CLINIC_OR_DEPARTMENT_OTHER)
Admission: EM | Admit: 2023-09-10 | Discharge: 2023-09-13 | DRG: 199 | Disposition: A | Discharge status: Home / Self Care | Payer: Medicare Other | Attending: Internal Medicine | Admitting: Internal Medicine

## 2023-09-10 ENCOUNTER — Encounter (HOSPITAL_BASED_OUTPATIENT_CLINIC_OR_DEPARTMENT_OTHER): Payer: Self-pay

## 2023-09-10 DIAGNOSIS — Z881 Allergy status to other antibiotic agents status: Secondary | ICD-10-CM | POA: Diagnosis not present

## 2023-09-10 DIAGNOSIS — I480 Paroxysmal atrial fibrillation: Secondary | ICD-10-CM | POA: Diagnosis not present

## 2023-09-10 DIAGNOSIS — Z87891 Personal history of nicotine dependence: Secondary | ICD-10-CM

## 2023-09-10 DIAGNOSIS — Z953 Presence of xenogenic heart valve: Secondary | ICD-10-CM

## 2023-09-10 DIAGNOSIS — J939 Pneumothorax, unspecified: Secondary | ICD-10-CM | POA: Diagnosis not present

## 2023-09-10 DIAGNOSIS — I050 Rheumatic mitral stenosis: Secondary | ICD-10-CM | POA: Diagnosis not present

## 2023-09-10 DIAGNOSIS — J4489 Other specified chronic obstructive pulmonary disease: Secondary | ICD-10-CM | POA: Diagnosis not present

## 2023-09-10 DIAGNOSIS — Z79899 Other long term (current) drug therapy: Secondary | ICD-10-CM

## 2023-09-10 DIAGNOSIS — Z806 Family history of leukemia: Secondary | ICD-10-CM

## 2023-09-10 DIAGNOSIS — R791 Abnormal coagulation profile: Secondary | ICD-10-CM | POA: Diagnosis not present

## 2023-09-10 DIAGNOSIS — Z90710 Acquired absence of both cervix and uterus: Secondary | ICD-10-CM

## 2023-09-10 DIAGNOSIS — Z87440 Personal history of urinary (tract) infections: Secondary | ICD-10-CM

## 2023-09-10 DIAGNOSIS — Z880 Allergy status to penicillin: Secondary | ICD-10-CM

## 2023-09-10 DIAGNOSIS — F419 Anxiety disorder, unspecified: Secondary | ICD-10-CM | POA: Diagnosis present

## 2023-09-10 DIAGNOSIS — R079 Chest pain, unspecified: Secondary | ICD-10-CM | POA: Diagnosis not present

## 2023-09-10 DIAGNOSIS — I440 Atrioventricular block, first degree: Secondary | ICD-10-CM | POA: Diagnosis present

## 2023-09-10 DIAGNOSIS — E876 Hypokalemia: Secondary | ICD-10-CM | POA: Diagnosis not present

## 2023-09-10 DIAGNOSIS — I130 Hypertensive heart and chronic kidney disease with heart failure and stage 1 through stage 4 chronic kidney disease, or unspecified chronic kidney disease: Secondary | ICD-10-CM | POA: Diagnosis present

## 2023-09-10 DIAGNOSIS — Z951 Presence of aortocoronary bypass graft: Secondary | ICD-10-CM

## 2023-09-10 DIAGNOSIS — J9311 Primary spontaneous pneumothorax: Principal | ICD-10-CM | POA: Diagnosis present

## 2023-09-10 DIAGNOSIS — Z885 Allergy status to narcotic agent status: Secondary | ICD-10-CM | POA: Diagnosis not present

## 2023-09-10 DIAGNOSIS — G8929 Other chronic pain: Secondary | ICD-10-CM | POA: Diagnosis present

## 2023-09-10 DIAGNOSIS — I4811 Longstanding persistent atrial fibrillation: Secondary | ICD-10-CM

## 2023-09-10 DIAGNOSIS — E785 Hyperlipidemia, unspecified: Secondary | ICD-10-CM | POA: Diagnosis not present

## 2023-09-10 DIAGNOSIS — N179 Acute kidney failure, unspecified: Secondary | ICD-10-CM | POA: Diagnosis not present

## 2023-09-10 DIAGNOSIS — Z7901 Long term (current) use of anticoagulants: Secondary | ICD-10-CM

## 2023-09-10 DIAGNOSIS — U071 COVID-19: Secondary | ICD-10-CM | POA: Diagnosis not present

## 2023-09-10 DIAGNOSIS — I5032 Chronic diastolic (congestive) heart failure: Secondary | ICD-10-CM | POA: Diagnosis not present

## 2023-09-10 DIAGNOSIS — R918 Other nonspecific abnormal finding of lung field: Secondary | ICD-10-CM | POA: Diagnosis not present

## 2023-09-10 DIAGNOSIS — J982 Interstitial emphysema: Secondary | ICD-10-CM | POA: Diagnosis not present

## 2023-09-10 DIAGNOSIS — Z888 Allergy status to other drugs, medicaments and biological substances status: Secondary | ICD-10-CM | POA: Diagnosis not present

## 2023-09-10 DIAGNOSIS — N1831 Chronic kidney disease, stage 3a: Secondary | ICD-10-CM

## 2023-09-10 DIAGNOSIS — I2510 Atherosclerotic heart disease of native coronary artery without angina pectoris: Secondary | ICD-10-CM | POA: Diagnosis present

## 2023-09-10 DIAGNOSIS — I455 Other specified heart block: Secondary | ICD-10-CM | POA: Diagnosis not present

## 2023-09-10 DIAGNOSIS — I4891 Unspecified atrial fibrillation: Secondary | ICD-10-CM | POA: Diagnosis not present

## 2023-09-10 DIAGNOSIS — R5381 Other malaise: Secondary | ICD-10-CM | POA: Diagnosis present

## 2023-09-10 LAB — RESP PANEL BY RT-PCR (RSV, FLU A&B, COVID)  RVPGX2
Influenza A by PCR: NEGATIVE
Influenza B by PCR: NEGATIVE
Resp Syncytial Virus by PCR: NEGATIVE
SARS Coronavirus 2 by RT PCR: POSITIVE — AB

## 2023-09-10 LAB — BASIC METABOLIC PANEL
Anion gap: 9 (ref 5–15)
BUN: 19 mg/dL (ref 8–23)
CO2: 31 mmol/L (ref 22–32)
Calcium: 9.5 mg/dL (ref 8.9–10.3)
Chloride: 101 mmol/L (ref 98–111)
Creatinine, Ser: 0.94 mg/dL (ref 0.44–1.00)
GFR, Estimated: 59 mL/min — ABNORMAL LOW (ref >60)
Glucose, Bld: 111 mg/dL — ABNORMAL HIGH (ref 70–99)
Potassium: 3.3 mmol/L — ABNORMAL LOW (ref 3.5–5.1)
Sodium: 141 mmol/L (ref 135–145)

## 2023-09-10 LAB — CBC
HCT: 37.1 % (ref 36.0–46.0)
Hemoglobin: 12.7 g/dL (ref 12.0–15.0)
MCH: 30.6 pg (ref 26.0–34.0)
MCHC: 34.2 g/dL (ref 30.0–36.0)
MCV: 89.4 fL (ref 80.0–100.0)
Platelets: 164 10*3/uL (ref 150–400)
RBC: 4.15 MIL/uL (ref 3.87–5.11)
RDW: 12.9 % (ref 11.5–15.5)
WBC: 7.6 10*3/uL (ref 4.0–10.5)
nRBC: 0 % (ref 0.0–0.2)

## 2023-09-10 LAB — PROTIME-INR
INR: 1.5 — ABNORMAL HIGH (ref 0.8–1.2)
Prothrombin Time: 18.1 s — ABNORMAL HIGH (ref 11.4–15.2)

## 2023-09-10 LAB — TROPONIN I (HIGH SENSITIVITY): Troponin I (High Sensitivity): 30 ng/L — ABNORMAL HIGH (ref <18)

## 2023-09-10 MED ORDER — ACETAMINOPHEN 500 MG PO TABS
1000.0000 mg | ORAL_TABLET | Freq: Once | ORAL | Status: AC
  Administered 2023-09-10: 1000 mg via ORAL
  Filled 2023-09-10: qty 2

## 2023-09-10 MED ORDER — IOHEXOL 350 MG/ML SOLN
100.0000 mL | Freq: Once | INTRAVENOUS | Status: AC | PRN
  Administered 2023-09-10: 75 mL via INTRAVENOUS

## 2023-09-10 MED ORDER — DEXAMETHASONE SODIUM PHOSPHATE 10 MG/ML IJ SOLN
10.0000 mg | Freq: Once | INTRAMUSCULAR | Status: AC
  Administered 2023-09-11: 10 mg via INTRAVENOUS
  Filled 2023-09-10: qty 1

## 2023-09-10 NOTE — ED Notes (Signed)
Patient placed on non-rebreather oxygen mask per MD order   09/10/23 2339  Therapy Vitals  Pulse Rate 65  Resp 12  MEWS Score/Color  MEWS Score 2  MEWS Score Color Yellow  Oxygen Therapy/Pulse Ox  O2 Device Non-rebreather Mask  O2 Therapy Oxygen  O2 Flow Rate (L/min) 10 L/min  FiO2 (%) 100 %  SpO2 100 %

## 2023-09-10 NOTE — ED Provider Notes (Signed)
[facility]MENT AT [facility] Provider Note   CSN: [id number] Arrival date & time: 09/10/23  2006     History {Add pertinent medical, surgical, social history, OB history to HPI:1} Chief Complaint  Patient presents with   Chest Pain   Shortness of Breath   Fever    Catherine [name] is a 88 y.o. female.  Patient with a history of hypertension, atrial fibrillation, mitral stenosis status post mechanical valve replacement, COPD, diastolic heart failure, aortic stenosis presenting with a 1 day history of bodyaches, congestion, shortness of breath, fatigue and fever.  States she woke up feeling poorly this morning.  Found to have a fever of 102 at home.  She endorses congestion in her nose but not much cough.  Does feel increased shortness of breath compared to baseline.  Intermittent left-sided chest and shoulder pain, ongoing for the past 2 to 3 days but none currently.  It is not exertional or pleuritic.  Denies abdominal pain.  Did have 1 episode of diarrhea this morning.  No vomiting.  No leg pain or leg swelling.  No sick contacts at home.  No pain with urination or blood in the urine.  Febrile on ED arrival.  Did use Tylenol at home.  Denies significant headache or bodyaches.  The history is provided by the patient.  Chest Pain Associated symptoms: cough, fatigue, fever, shortness of breath and weakness   Associated symptoms: no abdominal pain, no nausea and no vomiting   Shortness of Breath Associated symptoms: chest pain, cough, fever and sore throat   Associated symptoms: no abdominal pain, no rash and no vomiting   Fever Associated symptoms: chest pain, congestion, cough, myalgias and sore throat   Associated symptoms: no dysuria, no nausea, no rash and no vomiting        Home Medications Prior to Admission medications   Medication Sig Start Date End Date Taking? Authorizing Provider  acetaminophen (TYLENOL) 500 MG tablet Take 1,000 mg by mouth  every 6 (six) hours as needed for mild pain.    [provider]  albuterol (VENTOLIN HFA) 108 (90 Base) MCG/ACT inhaler INHALE 2 PUFFS INTO THE LUNGS EVERY 4 HOURS AS NEEDED FOR AHEEZING OR SHORTNESS OF BREATH 09/02/22   Coralyn Helling, MD  bumetanide (BUMEX) 2 MG tablet Please take 2mg  daily in the morning and then 2mg  in the afternoon on [place] AND FRIDAY 10/12/22   Alver Sorrow, NP  cholecalciferol (VITAMIN D3) 25 MCG (1000 UNIT) tablet Take 1,000 Units by mouth daily.    [provider]  diazepam (VALIUM) 5 MG tablet Take 1 tablet (5 mg total) by mouth at bedtime. 05/05/23   Burchette, Elberta Fortis, MD  estradiol (ESTRACE VAGINAL) 0.1 MG/GM vaginal cream USE 2 grams per vagina daily for 2 weeks and then 1 gram per vagina three times per week. Patient taking differently: USE 1 gram per vagina three times per week PRN 07/27/22   Burchette, Elberta Fortis, MD  fluticasone (FLONASE) 50 MCG/ACT nasal spray Place 1 spray into both nostrils daily as needed for allergies or rhinitis. 08/30/22   Coralyn Helling, MD  HYDROcodone-acetaminophen (NORCO/VICODIN) 5-325 MG tablet TAKE ONE TABLET EVERY 8 HOURS AS NEEDED FOR PAIN 08/29/23   Burchette, Elberta Fortis, MD  hydrocortisone (ANUSOL-HC) 2.5 % rectal cream Place 1 Application rectally 2 (two) times daily. 02/28/23   Philip Aspen, Limmie Patricia, MD  metolazone (ZAROXOLYN) 5 MG tablet TAKE ONE TABLET AS NEEDED ONCE A WEEK 30 MINUTES  BEFORE BUMEX DOSE. AS NEEDED ONLY FOR WEIGHT GAIN 2LBS OVERNIGHT OR 5 LBS IN ONE WEEK 10/11/22   Alver Sorrow, NP  metoprolol succinate (TOPROL-XL) 25 MG 24 hr tablet TAKE 1/2 TABLET DAILY 08/19/23   Alver Sorrow, NP  Multiple Vitamins-Minerals (HAIR/SKIN/NAILS/BIOTIN PO) Take 1 tablet by mouth daily.    [provider]  Multiple Vitamins-Minerals (MULTIVITAMIN ADULT EXTRA C) CHEW Chew 2 tablets by mouth daily.    [provider]  phenazopyridine (PYRIDIUM) 200 MG tablet Take 1 tablet (200 mg total) by  mouth 3 (three) times daily as needed (urine pain). 04/04/23   Nelwyn Salisbury, MD  potassium chloride (KLOR-CON) 10 MEQ tablet TAKE UP TO 8 TABLETS DAILY AS DIRECTED 07/15/23   Chilton Si, MD  sacubitril-valsartan (ENTRESTO) 24-26 MG Take 1 tablet by mouth 2 (two) times daily. 07/27/23   Chilton Si, MD  terconazole (TERAZOL 7) 0.4 % vaginal cream Place 1 applicator vaginally at bedtime. 07/11/23   [place], Betty G, MD  trimethoprim-polymyxin b Joaquim Lai) ophthalmic solution Place 2 drops into both eyes every 4 (four) hours. 12/15/22   Burchette, Elberta Fortis, MD  warfarin (COUMADIN) 2 MG tablet TAKE 1 TABLET BY MOUTH DAILY OR AS DIRECTED BY ANTICOAGULATION CLINIC 07/06/23   Burchette, Elberta Fortis, MD      Allergies    Aldactone [spironolactone], Amoxicillin, Diltiazem, Flagyl [metronidazole hcl], Flovent [fluticasone propionate], Gabapentin, Lyrica [pregabalin], Quinidine, Simvastatin, Tramadol, Verapamil, Amlodipine, Crestor [rosuvastatin], Livalo [pitavastatin], Polymyxin b-trimethoprim, Zetia [ezetimibe], Benazepril hcl, Ciprofloxacin, Codeine, and Nitrofurantoin monohyd macro    Review of Systems   Review of Systems  Constitutional:  Positive for activity change, appetite change, fatigue and fever.  HENT:  Positive for congestion and sore throat.   Respiratory:  Positive for cough, chest tightness and shortness of breath.   Cardiovascular:  Positive for chest pain.  Gastrointestinal:  Negative for abdominal pain, nausea and vomiting.  Genitourinary:  Negative for dysuria and hematuria.  Musculoskeletal:  Positive for arthralgias and myalgias.  Skin:  Negative for rash.  Neurological:  Positive for weakness.   all other systems are negative except as noted in the HPI and PMH.    Physical Exam Updated Vital Signs BP 123/62   Pulse 75   Temp (!) 101.2 F (38.4 C)   Resp 11   Ht 5\' 3"  (1.6 m)   Wt 55.8 kg   SpO2 94%   BMI 21.79 kg/m  Physical Exam Vitals and nursing note  reviewed.  Constitutional:      General: She is not in acute distress.    Appearance: She is well-developed.     Comments: No distress, speaking full sentences  HENT:     Head: Normocephalic and atraumatic.     Mouth/Throat:     Pharynx: No oropharyngeal exudate.  Eyes:     Conjunctiva/sclera: Conjunctivae normal.     Pupils: Pupils are equal, round, and reactive to light.  Neck:     Comments: No meningismus. Cardiovascular:     Rate and Rhythm: Normal rate and regular rhythm.     Heart sounds: Normal heart sounds. No murmur heard. Pulmonary:     Effort: Pulmonary effort is normal. No respiratory distress.     Breath sounds: Normal breath sounds.     Comments: Diminished bilaterally Abdominal:     Palpations: Abdomen is soft.     Tenderness: There is no abdominal tenderness. There is no guarding or rebound.  Musculoskeletal:        General:  No tenderness. Normal range of motion.     Cervical back: Normal range of motion and neck supple.     Right lower leg: No edema.     Left lower leg: No edema.  Skin:    General: Skin is warm.  Neurological:     Mental Status: She is alert and oriented to person, place, and time.     Cranial Nerves: No cranial nerve deficit.     Motor: No abnormal muscle tone.     Coordination: Coordination normal.     Comments:  5/5 strength throughout. CN 2-12 intact.Equal grip strength.   Psychiatric:        Behavior: Behavior normal.     ED Results / Procedures / Treatments   Labs (all labs ordered are listed, but only abnormal results are displayed) Labs Reviewed  RESP PANEL BY RT-PCR (RSV, FLU A&B, COVID)  RVPGX2 - Abnormal; Notable for the following components:      Result Value   SARS Coronavirus 2 by RT PCR POSITIVE (*)    All other components within normal limits  BASIC METABOLIC PANEL - Abnormal; Notable for the following components:   Potassium 3.3 (*)    Glucose, Bld 111 (*)    GFR, Estimated 59 (*)    All other components within  normal limits  TROPONIN I (HIGH SENSITIVITY) - Abnormal; Notable for the following components:   Troponin I (High Sensitivity) 30 (*)    All other components within normal limits  CULTURE, BLOOD (ROUTINE X 2)  CULTURE, BLOOD (ROUTINE X 2)  CBC  PROTIME-INR  LACTIC ACID, PLASMA  LACTIC ACID, PLASMA  TROPONIN I (HIGH SENSITIVITY)    EKG None  Radiology DG Chest Port 1 View Result Date: 09/10/2023 CLINICAL DATA:  Chest pain, COVID. EXAM: PORTABLE CHEST 1 VIEW COMPARISON:  Chest x-ray 01/12/2022 FINDINGS: There is a small left pneumothorax measuring 6 mm from the lung apex. There are minimal patchy opacities in the left costophrenic angle. No pleural effusion. The cardiomediastinal silhouette is stable. Patient is status post cardiac surgery. The heart is mildly enlarged. No acute fractures are seen. Cervical spinal fusion plate is present. IMPRESSION: 1. Small left pneumothorax measuring 6 mm from the lung apex. 2. Minimal patchy opacities in the left costophrenic angle, likely atelectasis. These results were called by telephone at the time of interpretation on 09/10/2023 at 10:46 pm to provider HAYLEY NAASZ , who verbally acknowledged these results. Electronically Signed   By: Darliss Cheney M.D.   On: 09/10/2023 22:46    Procedures .Critical Care  Performed by: Glynn Octave, MD Authorized by: Glynn Octave, MD   Critical care provider statement:    Critical care time (minutes):  45   Critical care time was exclusive of:  Separately billable procedures and treating other patients   Critical care was necessary to treat or prevent imminent or life-threatening deterioration of the following conditions:  Respiratory failure   Critical care was time spent personally by me on the following activities:  Development of treatment plan with patient or surrogate, discussions with consultants, evaluation of patient's response to treatment, examination of patient, ordering and review of  laboratory studies, ordering and review of radiographic studies, ordering and performing treatments and interventions, pulse oximetry, re-evaluation of patient's condition, review of old charts, blood draw for specimens and obtaining history from patient or surrogate   I assumed direction of critical care for this patient from another provider in my specialty: no     Care  discussed with: admitting provider     {Document cardiac monitor, telemetry assessment procedure when appropriate:1}  Medications Ordered in ED Medications  dexamethasone (DECADRON) injection 10 mg (has no administration in time range)  iohexol (OMNIPAQUE) 350 MG/ML injection 100 mL (has no administration in time range)  acetaminophen (TYLENOL) tablet 1,000 mg (1,000 mg Oral Given 09/10/23 2252)    ED Course/ Medical Decision Making/ A&P Clinical Course as of 09/10/23 2321  Sat Sep 10, 2023  2247 Received call from radiology about small L PTX. Pt with covid, likely d/t coughing. Will order CT chest wo contrast. She is not hypoxic on RA. [HN]    Clinical Course User Index [HN] Loetta Rough, MD   {   Click here for ABCD2, HEART and other calculatorsREFRESH Note before signing :1}                              Medical Decision Making Amount and/or Complexity of Data Reviewed Independent Historian: spouse Labs: ordered. Decision-making details documented in ED Course. Radiology: ordered and independent interpretation performed. Decision-making details documented in ED Course. ECG/medicine tests: ordered and independent interpretation performed. Decision-making details documented in ED Course.  Risk Prescription drug management.   1 day of bodyaches, chills, fever, congestion, shortness of breath.  Vitals are stable on arrival but she is febrile.  Clear lungs bilaterally.  Borderline O2 saturations.  Called from triage that x-ray is positive for left-sided apical pneumothorax.  Results reviewed interpreted by me.   There is no midline shift.  Found to be COVID-positive.  D/w Dr. Larinda Buttery pulmonary critical care.  He recommends no distress tube at this time but can monitor pneumothorax.  Recommend 100% FiO2 on nonrebreather and repeat chest x-ray in 6 hours which will be at 4 AM.  He will consult but admit to hospitalist service.  {Document critical care time when appropriate:1} {Document review of labs and clinical decision tools ie heart score, Chads2Vasc2 etc:1}  {Document your independent review of radiology images, and any outside records:1} {Document your discussion with family members, caretakers, and with consultants:1} {Document social determinants of health affecting pt's care:1} {Document your decision making why or why not admission, treatments were needed:1} Final Clinical Impression(s) / ED Diagnoses Final diagnoses:  None    Rx / DC Orders ED Discharge Orders     None

## 2023-09-10 NOTE — Progress Notes (Signed)
Plan of Care Note for accepted transfer   Patient: Catherine [name] MRN: [id number]   DOA: 09/10/2023  Facility requesting transfer: [facility]   Requesting Provider: Dr. Manus Gunning   Reason for transfer: Small pneumothorax, COVID+   Facility course: 88 yr old female with HTN, HLD, CAD, HFpEF, PAF on warfarin, and COPD presents with fever, aches, malaise, cough, chest pain and SOB.   She is febrile with normal HR and RR, stable BP, and saturating mid-90s on rm air.  ED workup most notable for positive COVID-19 pcr and small left pneumothorax on CXR.   PCCM (Dr. Larinda Buttery) recommended administering 100% FiO2 and repeating CXR in 6 hours. She was treated with dexamethasone and acetaminophen in ED.   CTA chest, lactic acid level, and second troponin are in process.   Plan of care: The patient is accepted for admission to Telemetry unit, at [facility].   Author: Briscoe Deutscher, MD 09/10/2023  Check [email or web address] for on-call coverage.  Nursing staff, Please call [facility] Admits & Consults System-Wide number on Amion as soon as patient's arrival, so appropriate admitting provider can evaluate the pt.

## 2023-09-10 NOTE — ED Provider Notes (Signed)
Clinical Course as of 09/10/23 2247  Sat Sep 10, 2023  2247 Received call from radiology about small L PTX. Pt with covid, likely d/t coughing. Will order CT chest wo contrast. She is not hypoxic on RA. [HN]    Clinical Course User Index [HN] Loetta Rough, MD      Loetta Rough, MD 09/10/23 [phone]

## 2023-09-10 NOTE — ED Triage Notes (Addendum)
Pt POV from home reporting fever congestion, SOB, and L sided chest pain that began this morning. Hx COPD and CHF. Breathing even and unlabored in triage, NAD noted at this time. Last tylenol dose at 1800, unable to take ibuprofen.

## 2023-09-11 ENCOUNTER — Observation Stay (HOSPITAL_COMMUNITY): Payer: Medicare Other

## 2023-09-11 DIAGNOSIS — J982 Interstitial emphysema: Secondary | ICD-10-CM | POA: Diagnosis not present

## 2023-09-11 DIAGNOSIS — E876 Hypokalemia: Secondary | ICD-10-CM | POA: Diagnosis not present

## 2023-09-11 DIAGNOSIS — I5032 Chronic diastolic (congestive) heart failure: Secondary | ICD-10-CM | POA: Diagnosis not present

## 2023-09-11 DIAGNOSIS — N1831 Chronic kidney disease, stage 3a: Secondary | ICD-10-CM | POA: Diagnosis not present

## 2023-09-11 DIAGNOSIS — J9811 Atelectasis: Secondary | ICD-10-CM | POA: Diagnosis not present

## 2023-09-11 DIAGNOSIS — J984 Other disorders of lung: Secondary | ICD-10-CM | POA: Diagnosis not present

## 2023-09-11 DIAGNOSIS — R079 Chest pain, unspecified: Secondary | ICD-10-CM | POA: Diagnosis not present

## 2023-09-11 DIAGNOSIS — Z79899 Other long term (current) drug therapy: Secondary | ICD-10-CM | POA: Diagnosis not present

## 2023-09-11 DIAGNOSIS — Z881 Allergy status to other antibiotic agents status: Secondary | ICD-10-CM | POA: Diagnosis not present

## 2023-09-11 DIAGNOSIS — I455 Other specified heart block: Secondary | ICD-10-CM | POA: Diagnosis not present

## 2023-09-11 DIAGNOSIS — N179 Acute kidney failure, unspecified: Secondary | ICD-10-CM | POA: Diagnosis not present

## 2023-09-11 DIAGNOSIS — I480 Paroxysmal atrial fibrillation: Secondary | ICD-10-CM | POA: Diagnosis not present

## 2023-09-11 DIAGNOSIS — Z885 Allergy status to narcotic agent status: Secondary | ICD-10-CM | POA: Diagnosis not present

## 2023-09-11 DIAGNOSIS — J4489 Other specified chronic obstructive pulmonary disease: Secondary | ICD-10-CM | POA: Diagnosis not present

## 2023-09-11 DIAGNOSIS — R791 Abnormal coagulation profile: Secondary | ICD-10-CM | POA: Diagnosis present

## 2023-09-11 DIAGNOSIS — U071 COVID-19: Secondary | ICD-10-CM | POA: Diagnosis not present

## 2023-09-11 DIAGNOSIS — Z888 Allergy status to other drugs, medicaments and biological substances status: Secondary | ICD-10-CM | POA: Diagnosis not present

## 2023-09-11 DIAGNOSIS — I4891 Unspecified atrial fibrillation: Secondary | ICD-10-CM | POA: Diagnosis not present

## 2023-09-11 DIAGNOSIS — R0602 Shortness of breath: Secondary | ICD-10-CM | POA: Diagnosis not present

## 2023-09-11 DIAGNOSIS — I130 Hypertensive heart and chronic kidney disease with heart failure and stage 1 through stage 4 chronic kidney disease, or unspecified chronic kidney disease: Secondary | ICD-10-CM | POA: Diagnosis not present

## 2023-09-11 DIAGNOSIS — Z87891 Personal history of nicotine dependence: Secondary | ICD-10-CM | POA: Diagnosis not present

## 2023-09-11 DIAGNOSIS — J449 Chronic obstructive pulmonary disease, unspecified: Secondary | ICD-10-CM | POA: Diagnosis not present

## 2023-09-11 DIAGNOSIS — F419 Anxiety disorder, unspecified: Secondary | ICD-10-CM | POA: Diagnosis present

## 2023-09-11 DIAGNOSIS — I440 Atrioventricular block, first degree: Secondary | ICD-10-CM | POA: Diagnosis present

## 2023-09-11 DIAGNOSIS — Z7901 Long term (current) use of anticoagulants: Secondary | ICD-10-CM | POA: Diagnosis not present

## 2023-09-11 DIAGNOSIS — Z880 Allergy status to penicillin: Secondary | ICD-10-CM | POA: Diagnosis not present

## 2023-09-11 DIAGNOSIS — E785 Hyperlipidemia, unspecified: Secondary | ICD-10-CM | POA: Diagnosis not present

## 2023-09-11 DIAGNOSIS — R918 Other nonspecific abnormal finding of lung field: Secondary | ICD-10-CM | POA: Diagnosis not present

## 2023-09-11 DIAGNOSIS — I517 Cardiomegaly: Secondary | ICD-10-CM | POA: Diagnosis not present

## 2023-09-11 DIAGNOSIS — J939 Pneumothorax, unspecified: Secondary | ICD-10-CM | POA: Diagnosis not present

## 2023-09-11 DIAGNOSIS — Z951 Presence of aortocoronary bypass graft: Secondary | ICD-10-CM | POA: Diagnosis not present

## 2023-09-11 DIAGNOSIS — I2510 Atherosclerotic heart disease of native coronary artery without angina pectoris: Secondary | ICD-10-CM | POA: Diagnosis present

## 2023-09-11 DIAGNOSIS — J9311 Primary spontaneous pneumothorax: Secondary | ICD-10-CM | POA: Diagnosis not present

## 2023-09-11 DIAGNOSIS — I4811 Longstanding persistent atrial fibrillation: Secondary | ICD-10-CM | POA: Diagnosis not present

## 2023-09-11 DIAGNOSIS — Z953 Presence of xenogenic heart valve: Secondary | ICD-10-CM | POA: Diagnosis not present

## 2023-09-11 LAB — CBC
HCT: 41 % (ref 36.0–46.0)
Hemoglobin: 13.9 g/dL (ref 12.0–15.0)
MCH: 30 pg (ref 26.0–34.0)
MCHC: 33.9 g/dL (ref 30.0–36.0)
MCV: 88.6 fL (ref 80.0–100.0)
Platelets: 161 10*3/uL (ref 150–400)
RBC: 4.63 MIL/uL (ref 3.87–5.11)
RDW: 12.8 % (ref 11.5–15.5)
WBC: 5.9 10*3/uL (ref 4.0–10.5)
nRBC: 0 % (ref 0.0–0.2)

## 2023-09-11 LAB — BASIC METABOLIC PANEL
Anion gap: 13 (ref 5–15)
BUN: 19 mg/dL (ref 8–23)
CO2: 27 mmol/L (ref 22–32)
Calcium: 9.3 mg/dL (ref 8.9–10.3)
Chloride: 99 mmol/L (ref 98–111)
Creatinine, Ser: 1.02 mg/dL — ABNORMAL HIGH (ref 0.44–1.00)
GFR, Estimated: 54 mL/min — ABNORMAL LOW (ref >60)
Glucose, Bld: 163 mg/dL — ABNORMAL HIGH (ref 70–99)
Potassium: 3.3 mmol/L — ABNORMAL LOW (ref 3.5–5.1)
Sodium: 139 mmol/L (ref 135–145)

## 2023-09-11 LAB — LACTIC ACID, PLASMA
Lactic Acid, Venous: 0.8 mmol/L (ref 0.5–1.9)
Lactic Acid, Venous: 0.9 mmol/L (ref 0.5–1.9)

## 2023-09-11 LAB — TROPONIN I (HIGH SENSITIVITY): Troponin I (High Sensitivity): 34 ng/L — ABNORMAL HIGH (ref <18)

## 2023-09-11 LAB — MAGNESIUM: Magnesium: 2.1 mg/dL (ref 1.7–2.4)

## 2023-09-11 LAB — PHOSPHORUS: Phosphorus: 4 mg/dL (ref 2.5–4.6)

## 2023-09-11 MED ORDER — POLYETHYLENE GLYCOL 3350 17 G PO PACK: 17.0000 g | PACK | Freq: Every day | ORAL | Status: DC | PRN

## 2023-09-11 MED ORDER — ACETAMINOPHEN 325 MG PO TABS: 650.0000 mg | ORAL_TABLET | Freq: Four times a day (QID) | ORAL | Status: DC | PRN

## 2023-09-11 MED ORDER — POTASSIUM CHLORIDE CRYS ER 20 MEQ PO TBCR
40.0000 meq | EXTENDED_RELEASE_TABLET | Freq: Once | ORAL | Status: AC
  Administered 2023-09-11: 40 meq via ORAL
  Filled 2023-09-11: qty 2

## 2023-09-11 MED ORDER — POTASSIUM CHLORIDE 10 MEQ/100ML IV SOLN
10.0000 meq | INTRAVENOUS | Status: DC
  Filled 2023-09-11: qty 100

## 2023-09-11 MED ORDER — MELATONIN 3 MG PO TABS
3.0000 mg | ORAL_TABLET | Freq: Every evening | ORAL | Status: DC | PRN
  Administered 2023-09-11 – 2023-09-12 (×2): 3 mg via ORAL
  Filled 2023-09-11 (×2): qty 1

## 2023-09-11 MED ORDER — SACUBITRIL-VALSARTAN 24-26 MG PO TABS
1.0000 | ORAL_TABLET | Freq: Two times a day (BID) | ORAL | Status: DC
  Administered 2023-09-11 – 2023-09-12 (×2): 1 via ORAL
  Filled 2023-09-11 (×2): qty 1

## 2023-09-11 MED ORDER — SODIUM CHLORIDE 0.9 % IV SOLN
100.0000 mg | Freq: Every day | INTRAVENOUS | Status: DC
  Administered 2023-09-12: 100 mg via INTRAVENOUS
  Filled 2023-09-11: qty 20

## 2023-09-11 MED ORDER — DIAZEPAM 5 MG PO TABS
5.0000 mg | ORAL_TABLET | Freq: Every day | ORAL | Status: DC
  Administered 2023-09-11 – 2023-09-12 (×2): 5 mg via ORAL
  Filled 2023-09-11 (×2): qty 1

## 2023-09-11 MED ORDER — WARFARIN SODIUM 2 MG PO TABS
4.0000 mg | ORAL_TABLET | Freq: Once | ORAL | Status: AC
  Administered 2023-09-11: 4 mg via ORAL
  Filled 2023-09-11: qty 2

## 2023-09-11 MED ORDER — WARFARIN - PHARMACIST DOSING INPATIENT: Freq: Every day | Status: DC

## 2023-09-11 MED ORDER — GUAIFENESIN 100 MG/5ML PO LIQD
5.0000 mL | ORAL | Status: DC | PRN
  Administered 2023-09-11 – 2023-09-13 (×4): 5 mL via ORAL
  Filled 2023-09-11 (×4): qty 10

## 2023-09-11 MED ORDER — METOPROLOL SUCCINATE ER 25 MG PO TB24
12.5000 mg | ORAL_TABLET | Freq: Every day | ORAL | Status: DC
  Filled 2023-09-11: qty 1

## 2023-09-11 MED ORDER — SODIUM CHLORIDE 0.9 % IV SOLN
200.0000 mg | Freq: Once | INTRAVENOUS | Status: AC
  Administered 2023-09-11: 200 mg via INTRAVENOUS
  Filled 2023-09-11: qty 40

## 2023-09-11 MED ORDER — PROCHLORPERAZINE EDISYLATE 10 MG/2ML IJ SOLN: 5.0000 mg | Freq: Four times a day (QID) | INTRAMUSCULAR | Status: DC | PRN

## 2023-09-11 NOTE — Plan of Care (Signed)
?  Problem: Education: ?Goal: Knowledge of risk factors and measures for prevention of condition will improve ?Outcome: Progressing ?  ?Problem: Coping: ?Goal: Psychosocial and spiritual needs will be supported ?Outcome: Progressing ?  ?Problem: Respiratory: ?Goal: Will maintain a patent airway ?Outcome: Progressing ?Goal: Complications related to the disease process, condition or treatment will be avoided or minimized ?Outcome: Progressing ?  ?Problem: Education: ?Goal: Knowledge of General Education information will improve ?Description: Including pain rating scale, medication(s)/side effects and non-pharmacologic comfort measures ?Outcome: Progressing ?  ?

## 2023-09-11 NOTE — Progress Notes (Signed)
PROGRESS NOTE  Catherine [name]  DOB: Feb 21, 1935  PCP: Kristian Covey, MD NUU:[id number]  DOA: 09/10/2023  LOS: 0 days  Hospital Day: 2  Brief narrative: Catherine [name] is a 88 y.o. female with PMH significant for HTN, HLD, paroxysmal A-fib on Coumadin, severe MS s/p post bioprosthetic MVR, CAD/CABG, hypertension, hyperlipidemia, chronic HFpEF, CKD, chronic back pain with prior back surgeries, chronic insomnia 12/14, patient presented to ED at [facility] with fever, cough, shortness of breath, left-sided chest pain, malaise, grossly worsening for last 3 to 4 days  In the ED, patient had fever of 101.2 Labs with normal WBC count, potassium low at 3.3, troponin 30 COVID PCR positive Chest x-ray showed Small left pneumothorax measuring 6 mm from the lung apex.   Admitted to [facility] CT angio chest negative for PE, showed small abdomen soft, mild pneumomediastinum, mediastinal shift or hyperexpansion of the left hemithorax to suggest tension physiology. Pulmonology was consulted.  Subjective: Patient was seen and examined this morning.  Pleasant elderly Caucasian female.  Sitting up at the edge of the bed.  Not in distress.  Family not at bedside. On 2 L oxygen by nasal cannula.  Does not use oxygen at home.  States she used to smoke in the past but quit in 1970s.  Has history of COPD and was seeing Dr. Craige Cotta in the past.  Uses as needed albuterol. Chart reviewed Tmax 101.2 at 11 PM, heart rate in 40s to 50s, blood pressure in 100s  Assessment and plan: Small left pneumothorax In the setting of acute COVID infection and COPD No h/o trauma, esophageal disease Primary versus secondary thorax I wonder if she had emphysematous bullae that ruptured Currently on 100% FiO2 via nonrebreather for nitrogen washout effect. PCCM recommended repeat chest x-ray after 6 hours of 100% FiO2  Pending report of chest x-ray this morning.   COVID-19 viral infection Has been feeling poorly for the past 3  to 4 days COVID-19 screening test positive on 09/10/2023 Started on remdesivir x 3 days   H/o MS s/p bioprosthetic valve  Valvular paroxysmal A-fib on Coumadin Subtherapeutic INR PTA meds- Toprol 12.5 mg daily.  Resume the same Monitor INR Pharmacy consulted to assist with Coumadin dosing Recent Labs  Lab 09/07/23 0000 09/10/23 2325  INR 1.2* 1.5*   Chronic HFpEF Last 2D echo done on 11/03/2022 revealed LVEF 60-65% PTA meds- Toprol 12.5 mg daily, Entresto 24-26 mg twice daily, Bumex 2 mg daily in the morning as well as 2 mg in the afternoon MWF Resume Toprol and Entresto.  Clinically looks dry.  Hold Bumex for now   CKD 3A Renal function is at baseline Avoid nephrotoxic agents, dehydration, and hypotension. Monitor urine output with strict I's and O's Recent Labs    10/11/22 0957 02/11/23 1111 06/08/23 1539 08/31/23 0827 09/10/23 2018 09/11/23 0922  BUN 12 18 27* 27* 19 19  CREATININE 1.06* 0.90 1.02 0.93 0.94 1.02*  CO2 25 24 32 31 31 27     Mobility: Independent at baseline.  Encourage ambulation  Goals of care   Code Status: Full Code     DVT prophylaxis:   warfarin (COUMADIN) tablet 4 mg   Antimicrobials: None Fluid: None Consultants: None Family Communication: None at bedside  Status: Observation Level of care:  Telemetry   Patient is from: Home Needs to continue in-hospital care: COVID and pneumothorax Anticipated d/c to: Pending clinical course      Diet:  Diet Order  Diet Heart Room service appropriate? Yes; Fluid consistency: Thin  Diet effective now                   Scheduled Meds:  diazepam  5 mg Oral QHS   metoprolol succinate  12.5 mg Oral Daily   sacubitril-valsartan  1 tablet Oral BID   warfarin  4 mg Oral ONCE-1600   Warfarin - Pharmacist Dosing Inpatient   Does not apply q1600    PRN meds: acetaminophen, melatonin, polyethylene glycol, prochlorperazine   Infusions:   [START ON 09/12/2023] remdesivir 100  mg in sodium chloride 0.9 % 100 mL IVPB      Antimicrobials: Anti-infectives (From admission, onward)    Start     Dose/Rate Route Frequency Ordered Stop   09/12/23 1000  remdesivir 100 mg in sodium chloride 0.9 % 100 mL IVPB       Placed in "Followed by" Linked Group   100 mg 200 mL/hr over 30 Minutes Intravenous Daily 09/11/23 0557 09/14/23 0959   09/11/23 0730  remdesivir 200 mg in sodium chloride 0.9% 250 mL IVPB       Placed in "Followed by" Linked Group   200 mg 580 mL/hr over 30 Minutes Intravenous Once 09/11/23 0557 09/11/23 0841       Objective: Vitals:   09/11/23 0035 09/11/23 0800  BP:  116/84  Pulse: (!) 45 (!) 46  Resp: 15 14  Temp:  98.6 F (37 C)  SpO2: 100% 98%   No intake or output data in the 24 hours ending 09/11/23 1057 Filed Weights   09/10/23 2015  Weight: 55.8 kg   Weight change:  Body mass index is 21.79 kg/m.   Physical Exam: General exam: Pleasant, elderly Caucasian female.  Not in distress Skin: No rashes, lesions or ulcers. HEENT: Atraumatic, normocephalic, no obvious bleeding Lungs: Clear to auscultation bilaterally at the time of my evaluation CVS: Regular rate and rhythm, no murmur GI/Abd soft, nontender, nondistended, bowel sound present CNS: Alert, awake, oriented x 3 Psychiatry: Mood appropriate Extremities: No pedal edema, no calf tenderness  Data Review: I have personally reviewed the laboratory data and studies available.  F/u labs ordered Unresulted Labs (From admission, onward)     Start     Ordered   09/12/23 0500  Protime-INR  Daily,   R      09/11/23 0410   09/10/23 2307  Blood culture (routine x 2)  BLOOD CULTURE X 2,   STAT      09/10/23 2306            Total time spent in review of labs and imaging, patient evaluation, formulation of plan, documentation and communication with family: 55 minutes  Signed, Lorin Glass, MD [facility] 09/11/2023

## 2023-09-11 NOTE — ED Notes (Signed)
Report given to Charge nurse at [facility].

## 2023-09-11 NOTE — Consult Note (Signed)
HPI [name] is an 88 year old female patient with a past medical history of paroxysmal A-fib on Coumadin, COPD, hypertension, hyperlipidemia who presented from home with fever aches malaise left-sided chest pain and shortness of breath.  She was found to have a small left pneumothorax for which the pulmonary team is being consulted.  She has been complaining of days of not feeling well with fever generalized malaise that progressed to become shortness of breath and left-sided chest pain.  This prompted her presentation to the emergency department.  She was found to have COVID-19 positive.  CBC unremarkable.  BMP unremarkable with kidney function at baseline with creatinine 0.93 mg/dL.  Chest x-ray with small left pneumothorax measuring 6 mm.  CTA chest obtained 12/14 did not show pulmonary embolism but did show small left pneumothorax with mild pneumomediastinum. No signs of esophageal abnormality nor parenchymal lung disease.   Physical Exam  GEN: Stable in NAD  HEENT Supple neck, reactive pupils  CVS Brady w/ rate in the 50s, reg rhythm  Lungs Clear bilateral air entry  Abdomen Soft, non tender, non distended +BS  Extremities Warm and well perfused.   Labs and imaging were reviewed.   A/P  [name] is an 88 year old female patient with a past medical history of paroxysmal A-fib on Coumadin, COPD, hypertension, hyperlipidemia who presented from home with fever aches malaise left-sided chest pain and shortness of breath.  She was found to have a small left pneumothorax for which the pulmonary team is being consulted.  Chest x-ray with small left pneumothorax measuring 6 mm.  CTA chest obtained 12/14 did not show pulmonary embolism but did show small left pneumothorax with mild pneumomediastinum. No signs of esophageal abnormality nor parenchymal lung disease.   Unclear underlying cause. No signs of traumo, esophageal disease or parenchymal lung disease. She is COVID 19 + but not sure if  playing role in this. Primary SP?   Recs  []  100% FiO2 via non rebreather for nitrogen washout effect  []  Repeat CXR in 6 hours   I spent 60 minutes caring for this patient today, including preparing to see the patient, obtaining a medical history , reviewing a separately obtained history, performing a medically appropriate examination and/or evaluation, counseling and educating the patient/family/caregiver, documenting clinical information in the electronic health record, and independently interpreting results (not separately reported/billed) and communicating results to the patient/family/caregiver  Janann Colonel, MD [facility] 09/11/2023 5:39 AM

## 2023-09-11 NOTE — Progress Notes (Signed)
Patient arrived to the floor alert and oriented, CHG bath given, cardiac monitoring initiated. We'll continue to monitor.

## 2023-09-11 NOTE — Progress Notes (Signed)
PHARMACY - ANTICOAGULATION CONSULT NOTE  Pharmacy Consult for Warfarin  Indication: atrial fibrillation  Allergies  Allergen Reactions   Aldactone [Spironolactone] Other (See Comments)    dyspnea   Amoxicillin Palpitations    Tachycardia Has patient had a PCN reaction causing immediate rash, facial/tongue/throat swelling, SOB or lightheadedness with hypotension: no Has patient had a PCN reaction causing severe rash involving mucus membranes or skin necrosis: {no Has patient had a PCN reaction that required hospitalization {no Has patient had a PCN reaction occurring within the last 10 years: {yes If all of the above answers are "NO", then may proceed with Cephalosporin use.   Diltiazem Other (See Comments)    Causing headaches    Flagyl [Metronidazole Hcl] Other (See Comments)    Causing headaches     Flovent [Fluticasone Propionate] Other (See Comments)    Leg cramps   Gabapentin Swelling   Lyrica [Pregabalin] Swelling   Quinidine Diarrhea and Other (See Comments)    Fever diarrhea   Simvastatin Other (See Comments)    Leg pain, myalgia   Tramadol Nausea Only   Verapamil Other (See Comments)    myalgias   Amlodipine     Low extremity edema   Crestor [Rosuvastatin]     myalgia   Livalo [Pitavastatin]     myalgias   Polymyxin B-Trimethoprim Swelling   Zetia [Ezetimibe]     LEG CRAMPS    Benazepril Hcl Cough   Ciprofloxacin Diarrhea   Codeine Nausea Only   Nitrofurantoin Monohyd Macro Nausea Only    Patient Measurements: Height: 5\' 3"  (160 cm) Weight: 55.8 kg (123 lb) IBW/kg (Calculated) : 52.4  Vital Signs: Temp: 100 F (37.8 C) (12/15 0030) Temp Source: Oral (12/14 2026) BP: 111/49 (12/15 0030) Pulse Rate: 45 (12/15 0035)  Labs: Recent Labs    09/10/23 2018 09/10/23 2325  HGB 12.7  --   HCT 37.1  --   PLT 164  --   LABPROT  --  18.1*  INR  --  1.5*  CREATININE 0.94  --   TROPONINIHS 30* 34*    Estimated Creatinine Clearance: 35.5 mL/min (by  C-G formula based on SCr of 0.94 mg/dL).   Medical History: Past Medical History:  Diagnosis Date   Acute on chronic diastolic heart failure (HCC)    AKI (acute kidney injury) (HCC) 06/22/2022   Aortic stenosis, mild 07/26/2017   Arthritis    Asthma    last attack 02/2015   Atrial fibrillation, chronic (HCC)    Cellulitis of left lower extremity 08/17/2016   Ulcer associated with severe venous insufficiency   Chronic anticoagulation    Chronic diastolic CHF (congestive heart failure) (HCC)    Chronic kidney disease    "RIGHT MANY KIDNEY INFECTIONS AND STONES"   COPD (chronic obstructive pulmonary disease) (HCC)    Coronary artery disease    Dizziness    H/O: rheumatic fever    Heart murmur    Hypertension    PONV (postoperative nausea and vomiting)    ' SOMETIMES', BUT NOT ALWAYS"   Primary hypertension    S/P Maze operation for atrial fibrillation 10/14/2016   Complete bilateral atrial lesion set using cryothermy and bipolar radiofrequency ablation - atrial appendage was not treated due to previous surgical procedure (open mitral commissurotomy)   S/P mitral valve replacement with bioprosthetic valve 10/14/2016   29 mm Medtronic Mosaic porcine bioprosthetic tissue valve   Sciatica 08/29/2023   Tricuspid regurgitation 07/14/2021   UTI (urinary tract infection)  Valvular heart disease    Has mitral stenosis with prior mitral commissurotomy in 1970    Assessment: 88 y/o F who presents to the ED with fever, congestion, chest pain. Found to have COVID and small pneumothorax. INR is sub-therapeutic at 1.5.   Warfarin PTA dosing: 1 mg on Mon/Thurs, 2 mg all other days  Goal of Therapy:  INR 2-3 Monitor platelets by anticoagulation protocol: Yes   Plan:  Warfarin 4 mg PO x 1 today at 1600 Daily PT/INR Monitor for bleeding  Abran Duke, PharmD, BCPS Clinical Pharmacist Phone: [phone]

## 2023-09-11 NOTE — H&P (Addendum)
History and Physical  Catherine [name]:[id number] DOB: 12-10-1934 DOA: 09/10/2023  Referring physician: Accepted by Dr. Sherrell Puller, hospitalist service. PCP: Catherine Covey, [name]  Outpatient Specialists: Cardiology. Patient coming from: [facility].  Chief Complaint: Subjective fevers, body ache, malaise,cough, chest pain, shortness of breath  HPI: Catherine [name] is a 88 y.o. female with medical history significant for paroxysmal A-fib on Coumadin, severe mitral stenosis status post bioprosthetic mitral valve replacement, coronary artery disease status post single-vessel CABG, hypertension, hyperlipidemia with statin and Zetia intolerance, chronic HFpEF, chronic back pain with prior back surgeries, chronic insomnia, CKD 3A, who initially presented to [name] ED with a constellation of complaints, subjective fevers, body aches, malaise, cough, chest pain and shortness of breath for the past 3-4 days.  Associated with pleuritic pain, worse with taking a deep breath.  In the ED, a chest x-ray showed small left pneumothorax measuring 6 mm from the lung apex.  The patient subsequently had a CT angio chest PE which was negative for pulmonary embolism, confirmed small left pneumothorax, mild pneumomediastinum with no significant mediastinal shift or hyperexpansion of the left hemithorax to suggest tension physiology.  EDP discussed the case with PCCM who recommended 100% FiO2 and repeat chest x-ray in 6 hours.  The patient COVID-19 screening test returned positive.  Admitted by Catherine [name], hospitalist service.  ED Course: Temperature 100.  BP 111/49, pulse 59, respiratory 20, O2 saturation of 100% on nonrebreather.  Lab studies notable for serum potassium 3.3, glucose 111.  GFR 59.  Review of Systems: Review of systems as noted in the HPI. All other systems reviewed and are negative.   Past Medical History:  Diagnosis Date   Acute on chronic diastolic heart failure (HCC)    AKI  (acute kidney injury) (HCC) 06/22/2022   Aortic stenosis, mild 07/26/2017   Arthritis    Asthma    last attack 02/2015   Atrial fibrillation, chronic (HCC)    Cellulitis of left lower extremity 08/17/2016   Ulcer associated with severe venous insufficiency   Chronic anticoagulation    Chronic diastolic CHF (congestive heart failure) (HCC)    Chronic kidney disease    "RIGHT MANY KIDNEY INFECTIONS AND STONES"   COPD (chronic obstructive pulmonary disease) (HCC)    Coronary artery disease    Dizziness    H/O: rheumatic fever    Heart murmur    Hypertension    PONV (postoperative nausea and vomiting)    ' SOMETIMES', BUT NOT ALWAYS"   Primary hypertension    S/P Maze operation for atrial fibrillation 10/14/2016   Complete bilateral atrial lesion set using cryothermy and bipolar radiofrequency ablation - atrial appendage was not treated due to previous surgical procedure (open mitral commissurotomy)   S/P mitral valve replacement with bioprosthetic valve 10/14/2016   29 mm Medtronic Mosaic porcine bioprosthetic tissue valve   Sciatica 08/29/2023   Tricuspid regurgitation 07/14/2021   UTI (urinary tract infection)    Valvular heart disease    Has mitral stenosis with prior mitral commissurotomy in 1970   Past Surgical History:  Procedure Laterality Date   ABDOMINAL HYSTERECTOMY  1983   endometriosis   APPENDECTOMY     BACK SURGERY     neurosurgery x2   CARDIAC CATHETERIZATION     CARDIAC CATHETERIZATION N/A 08/03/2016   Procedure: Right/Left Heart Cath and Coronary Angiography;  Surgeon: Catherine [name];  Location: [facility] INVASIVE CV LAB;  Service: Cardiovascular;  Laterality: N/A;   cataract surg  CHOLECYSTECTOMY N/A 05/30/2015   Procedure: LAPAROSCOPIC CHOLECYSTECTOMY WITH INTRAOPERATIVE CHOLANGIOGRAM;  Surgeon: Catherine [name];  Location: [facility] OR;  Service: General;  Laterality: N/A;   COLONOSCOPY     CORONARY ARTERY BYPASS GRAFT N/A 10/14/2016   Procedure: CORONARY  ARTERY BYPASS GRAFTING (CABG);  Surgeon: Catherine [name];  [name] OR;  Service: Open Heart Surgery;  Laterality: N/A;   EYE SURGERY     MAZE N/A 10/14/2016   Procedure: MAZE;  Surgeon: Catherine [name];  [name];  Service: Open Heart Surgery;  Laterality: N/A;   MITRAL VALVE REPLACEMENT N/A 10/14/2016   Procedure: REDO MITRAL VALVE REPLACEMENT (MVR);  Surgeon: Catherine [name];  Location: [facility] OR;  Service: Open Heart Surgery;  Laterality: N/A;   MITRAL VALVE SURGERY Left 1970   Open mitral commissurotomy via left thoracotomy approach   TEE WITHOUT CARDIOVERSION N/A 07/05/2016   Procedure: TRANSESOPHAGEAL ECHOCARDIOGRAM (TEE);  Surgeon: Catherine [name];  Location: [facility] ENDOSCOPY;  Service: Cardiovascular;  Laterality: N/A;   TEE WITHOUT CARDIOVERSION N/A 10/14/2016   Procedure: TRANSESOPHAGEAL ECHOCARDIOGRAM (TEE);  Surgeon: Catherine [name];  Location[name] OR;  Service: Open Heart Surgery;  Laterality: N/A;    Social History:  reports that she quit smoking about 47 years ago. Her smoking use included cigarettes. She started smoking about 62 years ago. She has a 15 pack-year smoking history. She has never used smokeless tobacco. She reports that she does not drink alcohol and does not use drugs.   Allergies  Allergen Reactions   Aldactone [Spironolactone] Other (See Comments)    dyspnea   Amoxicillin Palpitations    Tachycardia Has patient had a PCN reaction causing immediate rash, facial/tongue/throat swelling, SOB or lightheadedness with hypotension: no Has patient had a PCN reaction causing severe rash involving mucus membranes or skin necrosis: {no Has patient had a PCN reaction that required hospitalization {no Has patient had a PCN reaction occurring within the last 10 years: {yes If all of the above answers are "NO", then may proceed with Cephalosporin use.   Diltiazem Other (See Comments)    Causing headaches    Flagyl [Metronidazole Hcl] Other (See  Comments)    Causing headaches     Flovent [Fluticasone Propionate] Other (See Comments)    Leg cramps   Gabapentin Swelling   Lyrica [Pregabalin] Swelling   Quinidine Diarrhea and Other (See Comments)    Fever diarrhea   Simvastatin Other (See Comments)    Leg pain, myalgia   Tramadol Nausea Only   Verapamil Other (See Comments)    myalgias   Amlodipine     Low extremity edema   Crestor [Rosuvastatin]     myalgia   Livalo [Pitavastatin]     myalgias   Polymyxin B-Trimethoprim Swelling   Zetia [Ezetimibe]     LEG CRAMPS    Benazepril Hcl Cough   Ciprofloxacin Diarrhea   Codeine Nausea Only   Nitrofurantoin Monohyd Macro Nausea Only    Family History  Problem Relation Age of Onset   Leukemia Father    Breast cancer Neg Hx       Prior to Admission medications   Medication Sig Start Date End Date Taking? Authorizing Provider  acetaminophen (TYLENOL) 500 MG tablet Take 1,000 mg by mouth every 6 (six) hours as needed for mild pain.    Provider, Historical, [name]  albuterol (VENTOLIN HFA) 108 (90 Base) MCG/ACT inhaler INHALE 2 PUFFS INTO THE LUNGS EVERY 4 HOURS AS NEEDED FOR  AHEEZING OR SHORTNESS OF BREATH 09/02/22   Coralyn Helling, [name]  bumetanide (BUMEX) 2 MG tablet Please take 2mg  daily in the morning and then 2mg  in the afternoon on [place], [facility] AND FRIDAY 10/12/22   Alver Sorrow, NP  cholecalciferol (VITAMIN D3) 25 MCG (1000 UNIT) tablet Take 1,000 Units by mouth daily.    Provider, Historical, [name]  diazepam (VALIUM) 5 MG tablet Take 1 tablet (5 mg total) by mouth at bedtime. 05/05/23   Burchette, Elberta Fortis, [name]  estradiol (ESTRACE VAGINAL) 0.1 MG/GM vaginal cream USE 2 grams per vagina daily for 2 weeks and then 1 gram per vagina three times per week. Patient taking differently: USE 1 gram per vagina three times per week PRN 07/27/22   Burchette, Elberta Fortis, [name]  fluticasone (FLONASE) 50 MCG/ACT nasal spray Place 1 spray into both nostrils daily as needed for allergies or  rhinitis. 08/30/22   Coralyn Helling, [name]  HYDROcodone-acetaminophen (NORCO/VICODIN) 5-325 MG tablet TAKE ONE TABLET EVERY 8 HOURS AS NEEDED FOR PAIN 08/29/23   Burchette, Elberta Fortis, [name]  hydrocortisone (ANUSOL-HC) 2.5 % rectal cream Place 1 Application rectally 2 (two) times daily. 02/28/23   Philip Aspen, Limmie Patricia, [name]  metolazone (ZAROXOLYN) 5 MG tablet TAKE ONE TABLET AS NEEDED ONCE A WEEK 30 MINUTES BEFORE BUMEX DOSE. AS NEEDED ONLY FOR WEIGHT GAIN 2LBS OVERNIGHT OR 5 LBS IN ONE WEEK 10/11/22   Alver Sorrow, NP  metoprolol succinate (TOPROL-XL) 25 MG 24 hr tablet TAKE 1/2 TABLET DAILY 08/19/23   Alver Sorrow, NP  Multiple Vitamins-Minerals (HAIR/SKIN/NAILS/BIOTIN PO) Take 1 tablet by mouth daily.    Provider, Historical, [name]  Multiple Vitamins-Minerals (MULTIVITAMIN ADULT EXTRA C) CHEW Chew 2 tablets by mouth daily.    Provider, Historical, [name]  phenazopyridine (PYRIDIUM) 200 MG tablet Take 1 tablet (200 mg total) by mouth 3 (three) times daily as needed (urine pain). 04/04/23   Nelwyn Salisbury, [name]  potassium chloride (KLOR-CON) 10 MEQ tablet TAKE UP TO 8 TABLETS DAILY AS DIRECTED 07/15/23   Catherine [name]  sacubitril-valsartan (ENTRESTO) 24-26 MG Take 1 tablet by mouth 2 (two) times daily. 07/27/23   Catherine [name]  terconazole (TERAZOL 7) 0.4 % vaginal cream Place 1 applicator vaginally at bedtime. 07/11/23   [place], Betty G, [name]  trimethoprim-polymyxin b Joaquim Lai) ophthalmic solution Place 2 drops into both eyes every 4 (four) hours. 12/15/22   Burchette, Elberta Fortis, [name]  warfarin (COUMADIN) 2 MG tablet TAKE 1 TABLET BY MOUTH DAILY OR AS DIRECTED BY ANTICOAGULATION CLINIC 07/06/23   Catherine Covey, [name]    Physical Exam: BP (!) 111/49   Pulse (!) 45   Temp 100 F (37.8 C)   Resp 15   Ht 5\' 3"  (1.6 m)   Wt 55.8 kg   SpO2 100%   BMI 21.79 kg/m   General: 88 y.o. year-old female well developed well nourished in no acute distress.  Alert and oriented x3. Cardiovascular: Regular  rate and rhythm with no rubs or gallops.  No thyromegaly or JVD noted.  No lower extremity edema. 2/4 pulses in all 4 extremities. Respiratory: Clear to auscultation with no wheezes or rales. Good inspiratory effort. Abdomen: Soft nontender nondistended with normal bowel sounds x4 quadrants. Muskuloskeletal: No cyanosis, clubbing or edema noted bilaterally Neuro: CN II-XII intact, strength, sensation, reflexes Skin: No ulcerative lesions noted or rashes Psychiatry: Judgement and insight appear normal. Mood is appropriate for condition and setting  Labs on Admission:  Basic Metabolic Panel: Recent Labs  Lab 09/10/23 2018  NA 141  K 3.3*  CL 101  CO2 31  GLUCOSE 111*  BUN 19  CREATININE 0.94  CALCIUM 9.5   Liver Function Tests: No results for input(s): "AST", "ALT", "ALKPHOS", "BILITOT", "PROT", "ALBUMIN" in the last 168 hours. No results for input(s): "LIPASE", "AMYLASE" in the last 168 hours. No results for input(s): "AMMONIA" in the last 168 hours. CBC: Recent Labs  Lab 09/10/23 2018  WBC 7.6  HGB 12.7  HCT 37.1  MCV 89.4  PLT 164   Cardiac Enzymes: No results for input(s): "CKTOTAL", "CKMB", "CKMBINDEX", "TROPONINI" in the last 168 hours.  BNP (last 3 results) Recent Labs    10/11/22 0957  BNP 181.8*    ProBNP (last 3 results) No results for input(s): "PROBNP" in the last 8760 hours.  CBG: No results for input(s): "GLUCAP" in the last 168 hours.  Radiological Exams on Admission: CT Angio Chest PE W and/or Wo Contrast Result Date: 09/11/2023 CLINICAL DATA:  Dyspnea, left chest pain, COPD, CHF EXAM: CT ANGIOGRAPHY CHEST WITH CONTRAST TECHNIQUE: Multidetector CT imaging of the chest was performed using the standard protocol during bolus administration of intravenous contrast. Multiplanar CT image reconstructions and MIPs were obtained to evaluate the vascular anatomy. RADIATION DOSE REDUCTION: This exam was performed according to the departmental  dose-optimization program which includes automated exposure control, adjustment of the mA and/or kV according to patient size and/or use of iterative reconstruction technique. CONTRAST:  75mL OMNIPAQUE IOHEXOL 350 MG/ML SOLN COMPARISON:  10/29/2021 FINDINGS: Cardiovascular: There is adequate opacification of the pulmonary arterial tree. No intraluminal filling defect identified to suggest acute pulmonary embolism. Central pulmonary arteries are mildly enlarged in keeping with changes of pulmonary arterial hypertension, stable since prior examination. Coronary artery bypass grafting and mitral valve replacement have been performed. Mild cardiomegaly with biatrial enlargement is present. No pericardial effusion. Mild atherosclerotic calcification within the thoracic aorta. No aortic aneurysm. Mediastinum/Nodes: No enlarged mediastinal, hilar, or axillary lymph nodes. Thyroid gland, trachea, and esophagus demonstrate no significant findings. Lungs/Pleura: Small left pneumothorax is present. There is mild pneumomediastinum within the left anterolateral mediastinum. No significant mediastinal shift or hyperexpansion of the left hemithorax to suggest tension physiology. Lingular atelectasis. No confluent pulmonary infiltrate. No pleural effusion. Upper Abdomen: No acute abnormality. Musculoskeletal: Remote appearing T10 and T11 compression deformities are seen with mild loss of height. No acute bone abnormality. No lytic or blastic bone lesion. Review of the MIP images confirms the above findings. IMPRESSION: 1. No pulmonary embolism. 2. Small left pneumothorax. Mild pneumomediastinum. No significant mediastinal shift or hyperexpansion of the left hemithorax to suggest tension physiology. 3. Mild cardiomegaly with biatrial enlargement. Status post coronary artery bypass grafting and mitral valve replacement. Aortic Atherosclerosis (ICD10-I70.0). Electronically Signed   By: Helyn Numbers M.D.   On: 09/11/2023 00:16   DG  Chest Port 1 View Result Date: 09/10/2023 CLINICAL DATA:  Chest pain, COVID. EXAM: PORTABLE CHEST 1 VIEW COMPARISON:  Chest x-ray 01/12/2022 FINDINGS: There is a small left pneumothorax measuring 6 mm from the lung apex. There are minimal patchy opacities in the left costophrenic angle. No pleural effusion. The cardiomediastinal silhouette is stable. Patient is status post cardiac surgery. The heart is mildly enlarged. No acute fractures are seen. Cervical spinal fusion plate is present. IMPRESSION: 1. Small left pneumothorax measuring 6 mm from the lung apex. 2. Minimal patchy opacities in the left costophrenic angle, likely atelectasis. These results were  called by telephone at the time of interpretation on 09/10/2023 at 10:46 pm to provider HAYLEY NAASZ , who verbally acknowledged these results. Electronically Signed   By: Darliss Cheney M.D.   On: 09/10/2023 22:46    EKG: I independently viewed the EKG done and my findings are as followed: Atrial fibrillation with slow ventricular response rate of 59.  Nonspecific ST-T changes.  QTc 443.  Assessment/Plan Present on Admission:  Pneumothorax on left  Principal Problem:   Pneumothorax on left  Small left pneumothorax, unclear etiology 100% nonrebreather Repeat chest x-ray after 6 hours of 100% FiO2 as suggested by PCCM. Maintain O2 saturation above 92% Rest of management per pulmonology.  COVID-19 viral infection, POA Has been feeling poorly for the past 3 to 4 days COVID-19 screening test positive on 09/10/2023 Remdesivir x 3 days  Valvular paroxysmal A-fib on Coumadin, rate controlled Subtherapeutic INR 1.5 Pharmacy consulted to assist with Coumadin dosing  Subtherapeutic INR Appreciate pharmacy's assistance with dosing Coumadin INR 1.5 Repeat INR  Chronic HFpEF Last 2D echo done on 11/03/2022 revealed LVEF 60-65% Euvolemic on exam Monitor strict I's and O's and daily weight  CKD 3A Renal function is at baseline Avoid  nephrotoxic agents, dehydration, and hypotension. Monitor urine output with strict I's and O's   Time: 75 minutes.   DVT prophylaxis: Home Coumadin  Code Status: Full code.  Family Communication: None at bedside.  Disposition Plan: Admitted to telemetry unit  Consults called: Pulmonology  Admission status: Observation status.   Status is: Observation    Darlin Drop [name] Pager [phone]  If 7PM-7AM, please contact night-coverage [email or web address] Password [name]  09/11/2023, 3:43 AM

## 2023-09-12 ENCOUNTER — Encounter (HOSPITAL_COMMUNITY): Payer: Self-pay | Admitting: Family Medicine

## 2023-09-12 ENCOUNTER — Inpatient Hospital Stay (HOSPITAL_COMMUNITY): Payer: Medicare Other

## 2023-09-12 ENCOUNTER — Telehealth: Payer: Self-pay

## 2023-09-12 DIAGNOSIS — Z953 Presence of xenogenic heart valve: Secondary | ICD-10-CM

## 2023-09-12 DIAGNOSIS — E876 Hypokalemia: Secondary | ICD-10-CM

## 2023-09-12 DIAGNOSIS — I5032 Chronic diastolic (congestive) heart failure: Secondary | ICD-10-CM

## 2023-09-12 DIAGNOSIS — U071 COVID-19: Secondary | ICD-10-CM | POA: Diagnosis not present

## 2023-09-12 DIAGNOSIS — I4891 Unspecified atrial fibrillation: Secondary | ICD-10-CM

## 2023-09-12 DIAGNOSIS — N1831 Chronic kidney disease, stage 3a: Secondary | ICD-10-CM | POA: Diagnosis not present

## 2023-09-12 DIAGNOSIS — J939 Pneumothorax, unspecified: Secondary | ICD-10-CM | POA: Diagnosis not present

## 2023-09-12 DIAGNOSIS — Z7901 Long term (current) use of anticoagulants: Secondary | ICD-10-CM

## 2023-09-12 LAB — CBC WITH DIFFERENTIAL/PLATELET
Abs Immature Granulocytes: 0.02 10*3/uL (ref 0.00–0.07)
Basophils Absolute: 0 10*3/uL (ref 0.0–0.1)
Basophils Relative: 0 %
Eosinophils Absolute: 0 10*3/uL (ref 0.0–0.5)
Eosinophils Relative: 0 %
HCT: 33.1 % — ABNORMAL LOW (ref 36.0–46.0)
Hemoglobin: 11.3 g/dL — ABNORMAL LOW (ref 12.0–15.0)
Immature Granulocytes: 0 %
Lymphocytes Relative: 15 %
Lymphs Abs: 0.9 10*3/uL (ref 0.7–4.0)
MCH: 30.1 pg (ref 26.0–34.0)
MCHC: 34.1 g/dL (ref 30.0–36.0)
MCV: 88.3 fL (ref 80.0–100.0)
Monocytes Absolute: 0.9 10*3/uL (ref 0.1–1.0)
Monocytes Relative: 14 %
Neutro Abs: 4.6 10*3/uL (ref 1.7–7.7)
Neutrophils Relative %: 71 %
Platelets: 170 10*3/uL (ref 150–400)
RBC: 3.75 MIL/uL — ABNORMAL LOW (ref 3.87–5.11)
RDW: 13.2 % (ref 11.5–15.5)
WBC: 6.4 10*3/uL (ref 4.0–10.5)
nRBC: 0 % (ref 0.0–0.2)

## 2023-09-12 LAB — BASIC METABOLIC PANEL
Anion gap: 7 (ref 5–15)
BUN: 31 mg/dL — ABNORMAL HIGH (ref 8–23)
CO2: 25 mmol/L (ref 22–32)
Calcium: 8.5 mg/dL — ABNORMAL LOW (ref 8.9–10.3)
Chloride: 104 mmol/L (ref 98–111)
Creatinine, Ser: 1.49 mg/dL — ABNORMAL HIGH (ref 0.44–1.00)
GFR, Estimated: 34 mL/min — ABNORMAL LOW (ref >60)
Glucose, Bld: 104 mg/dL — ABNORMAL HIGH (ref 70–99)
Potassium: 3.2 mmol/L — ABNORMAL LOW (ref 3.5–5.1)
Sodium: 136 mmol/L (ref 135–145)

## 2023-09-12 LAB — PROTIME-INR
INR: 1.8 — ABNORMAL HIGH (ref 0.8–1.2)
Prothrombin Time: 21.3 s — ABNORMAL HIGH (ref 11.4–15.2)

## 2023-09-12 MED ORDER — POTASSIUM CHLORIDE CRYS ER 20 MEQ PO TBCR
40.0000 meq | EXTENDED_RELEASE_TABLET | Freq: Once | ORAL | Status: AC
  Administered 2023-09-12: 40 meq via ORAL
  Filled 2023-09-12: qty 2

## 2023-09-12 MED ORDER — IPRATROPIUM-ALBUTEROL 0.5-2.5 (3) MG/3ML IN SOLN
3.0000 mL | Freq: Once | RESPIRATORY_TRACT | Status: AC
  Administered 2023-09-12: 3 mL via RESPIRATORY_TRACT
  Filled 2023-09-12: qty 3

## 2023-09-12 MED ORDER — IPRATROPIUM-ALBUTEROL 0.5-2.5 (3) MG/3ML IN SOLN
3.0000 mL | Freq: Two times a day (BID) | RESPIRATORY_TRACT | Status: DC
  Administered 2023-09-13: 3 mL via RESPIRATORY_TRACT
  Filled 2023-09-12: qty 3

## 2023-09-12 MED ORDER — IPRATROPIUM-ALBUTEROL 0.5-2.5 (3) MG/3ML IN SOLN: 3.0000 mL | RESPIRATORY_TRACT | Status: DC | PRN

## 2023-09-12 MED ORDER — IPRATROPIUM-ALBUTEROL 0.5-2.5 (3) MG/3ML IN SOLN: 3.0000 mL | Freq: Four times a day (QID) | RESPIRATORY_TRACT | Status: DC

## 2023-09-12 MED ORDER — WARFARIN SODIUM 2 MG PO TABS
2.0000 mg | ORAL_TABLET | Freq: Once | ORAL | Status: AC
  Administered 2023-09-12: 2 mg via ORAL
  Filled 2023-09-12: qty 1

## 2023-09-12 NOTE — Assessment & Plan Note (Addendum)
09-12-2023 on coumadin. Pharmacy managing INR.  09-13-2023 discussed with cards. Pt had been on coumadin in the past due to valvular afib from her mitral stenosis. Now that she has had mitral valve replacement with bioprosthetic valve, cards has decided to change her to Eliquis. This will eliminate her need to have INR checked for anticoagulation.

## 2023-09-12 NOTE — Progress Notes (Signed)
PHARMACY - ANTICOAGULATION CONSULT NOTE  Pharmacy Consult for Warfarin  Indication: atrial fibrillation  Allergies  Allergen Reactions   Aldactone [Spironolactone] Other (See Comments)    dyspnea   Amoxicillin Palpitations    Tachycardia Has patient had a PCN reaction causing immediate rash, facial/tongue/throat swelling, SOB or lightheadedness with hypotension: no Has patient had a PCN reaction causing severe rash involving mucus membranes or skin necrosis: {no Has patient had a PCN reaction that required hospitalization {no Has patient had a PCN reaction occurring within the last 10 years: {yes If all of the above answers are "NO", then may proceed with Cephalosporin use.   Diltiazem Other (See Comments)    Causing headaches    Flagyl [Metronidazole Hcl] Other (See Comments)    Causing headaches     Flovent [Fluticasone Propionate] Other (See Comments)    Leg cramps   Gabapentin Swelling   Lyrica [Pregabalin] Swelling   Quinidine Diarrhea and Other (See Comments)    Fever diarrhea   Simvastatin Other (See Comments)    Leg pain, myalgia   Tramadol Nausea Only   Verapamil Other (See Comments)    myalgias   Amlodipine     Low extremity edema   Crestor [Rosuvastatin]     myalgia   Livalo [Pitavastatin]     myalgias   Polymyxin B-Trimethoprim Swelling   Zetia [Ezetimibe]     LEG CRAMPS    Benazepril Hcl Cough   Ciprofloxacin Diarrhea   Codeine Nausea Only   Nitrofurantoin Monohyd Macro Nausea Only    Patient Measurements: Height: 5\' 3"  (160 cm) Weight: 55.8 kg (123 lb 0.3 oz) IBW/kg (Calculated) : 52.4  Vital Signs: Temp: 98.4 F (36.9 C) (12/16 0828) Temp Source: Oral (12/16 0828) BP: 134/45 (12/16 0828) Pulse Rate: 53 (12/16 0828)  Labs: Recent Labs    09/10/23 2018 09/10/23 2325 09/11/23 0922 09/12/23 0312  HGB 12.7  --  13.9 11.3*  HCT 37.1  --  41.0 33.1*  PLT 164  --  161 170  LABPROT  --  18.1*  --  21.3*  INR  --  1.5*  --  1.8*   CREATININE 0.94  --  1.02* 1.49*  TROPONINIHS 30* 34*  --   --     Estimated Creatinine Clearance: 22.4 mL/min (A) (by C-G formula based on SCr of 1.49 mg/dL (H)).   Medical History: Past Medical History:  Diagnosis Date   Acute on chronic diastolic heart failure (HCC)    AKI (acute kidney injury) (HCC) 06/22/2022   Aortic stenosis, mild 07/26/2017   Arthritis    Asthma    last attack 02/2015   Atrial fibrillation, chronic (HCC)    Cellulitis of left lower extremity 08/17/2016   Ulcer associated with severe venous insufficiency   Chronic anticoagulation    Chronic diastolic CHF (congestive heart failure) (HCC)    Chronic kidney disease    "RIGHT MANY KIDNEY INFECTIONS AND STONES"   COPD (chronic obstructive pulmonary disease) (HCC)    Coronary artery disease    Dizziness    H/O: rheumatic fever    Heart murmur    Hypertension    PONV (postoperative nausea and vomiting)    ' SOMETIMES', BUT NOT ALWAYS"   Primary hypertension    RHEUMATIC MITRAL STENOSIS 01/05/2008   Qualifier: Diagnosis of   By: Maple Hudson MD, Clinton D        S/P Maze operation for atrial fibrillation 10/14/2016   Complete bilateral atrial lesion set using cryothermy and bipolar  radiofrequency ablation - atrial appendage was not treated due to previous surgical procedure (open mitral commissurotomy)   S/P mitral valve replacement with bioprosthetic valve 10/14/2016   29 mm Medtronic Mosaic porcine bioprosthetic tissue valve   Sciatica 08/29/2023   Tricuspid regurgitation 07/14/2021   UTI (urinary tract infection)    Valvular heart disease    Has mitral stenosis with prior mitral commissurotomy in 1970    Assessment: 88 y/o F with h/o afib and severe MS s/p bioprosthetic MVR on warfarin who presents to the ED with fever, congestion, chest pain. Found to have COVID and small pneumothorax. INR on admission was sub-therapeutic at 1.5.   Warfarin PTA dosing: 1 mg on Mon/Thurs, 2 mg all other days  INR 1.8 s/p  4mg  dose. Hgb dropped from 13.9 to 11.3, plt WNL. No s/sx of bleeding per RN. Home dose would be 1mg  today, however will continue with increased dosing.  Goal of Therapy:  INR 2-3 Monitor platelets by anticoagulation protocol: Yes   Plan:  Warfarin 2mg  PO x1 tonight Daily PT/INR Monitor for bleeding  Nicole Kindred, PharmD PGY1 Pharmacy Resident 09/12/2023 10:50 AM

## 2023-09-12 NOTE — Progress Notes (Signed)
PROGRESS NOTE    Catherine [name]  OAC:[id number] DOB: 06/25/1935 DOA: 09/10/2023 PCP: Kristian Covey, MD  Subjective: Pt seen and examined. Pt has LOTS of questions. They are all related to her chronic disease states. I told her I would not be able to answer her questions as I am not familiar with all of her medical issues.  She PCP and other specialists that see her regularly would be able to provide comment on her "prognosis".  Pt weaned off NRB for her nitrogen wash-out for her PTX.   Hospital Course: HPI: ADLIH PEDROZA is a 88 y.o. female with medical history significant for paroxysmal A-fib on Coumadin, severe mitral stenosis status post bioprosthetic mitral valve replacement, coronary artery disease status post single-vessel CABG, hypertension, hyperlipidemia with statin and Zetia intolerance, chronic HFpEF, chronic back pain with prior back surgeries, chronic insomnia, CKD 3A, who initially presented to [facility] ED with a constellation of complaints, subjective fevers, body aches, malaise, cough, chest pain and shortness of breath for the past 3-4 days.  Associated with pleuritic pain, worse with taking a deep breath.   In the ED, a chest x-ray showed small left pneumothorax measuring 6 mm from the lung apex.  The patient subsequently had a CT angio chest PE which was negative for pulmonary embolism, confirmed small left pneumothorax, mild pneumomediastinum with no significant mediastinal shift or hyperexpansion of the left hemithorax to suggest tension physiology.   EDP discussed the case with PCCM who recommended 100% FiO2 and repeat chest x-ray in 6 hours.  The patient COVID-19 screening test returned positive.   Significant Events: Admitted 09/10/2023 for left pneumothorax   Significant Labs: Covid PCR POSITIVE WBC 7.6, HgB 12.7, Na 141, K 3.3, BUN 19, Scr 0.94  Significant Imaging Studies: Admission CTPA shows No pulmonary embolism. 2. Small left pneumothorax. Mild  pneumomediastinum. No significant mediastinal shift or hyperexpansion of the left hemithorax to suggest tension physiology. 3. Mild cardiomegaly with biatrial enlargement. Status post coronary artery bypass grafting and mitral valve replacement.  Antibiotic Therapy: Anti-infectives (From admission, onward)    Start     Dose/Rate Route Frequency Ordered Stop   09/12/23 1000  remdesivir 100 mg in sodium chloride 0.9 % 100 mL IVPB       Placed in "Followed by" Linked Group   100 mg 200 mL/hr over 30 Minutes Intravenous Daily 09/11/23 0557 09/14/23 0959   09/11/23 0730  remdesivir 200 mg in sodium chloride 0.9% 250 mL IVPB       Placed in "Followed by" Linked Group   200 mg 580 mL/hr over 30 Minutes Intravenous Once 09/11/23 0557 09/11/23 1000       Procedures:   Consultants: PCCM    Assessment and Plan: * Pneumothorax on left 09-12-2023 repeat CXR after 100% NRB for nitrogen washout shows resolution of PTX. No need for chest tube. Will repeat CXR in AM to ensure stability. Pt is nervous about going home. Told her that she should be discharged today but I'm keeping her overnight out of an abundance of caution.  COVID-19 virus infection 09-12-2023 pt without hypoxia. Treated with IV remdesivir. Continue supportive care. Will arrange for home nebulizer machine. Prn duonebs.  Hypokalemia 09-12-2023 replete with po kcl. Repeat CMP in AM.  CKD stage 3a, GFR 45-59 ml/min (HCC) 09-12-2023 Scr up to 1.49 today. Known CKD stage 3a with baseline Scr 1-1.4.  holding Entresto now. No indications for diuresis despite what pt is stating. Pt does not have any flank  edema. She says her abd is "hard" but in actuality her abd is soft and non-tender. No edema is noted. Do NOT give IV lasix.   Chronic diastolic CHF (congestive heart failure) (HCC) 09-12-2023 pt is euvolemic. Maybe even slightly hypovolemic. Definitely does not need diuresis today.  S/P mitral valve replacement with bioprosthetic  valve - Oct 14, 2016 09-12-2023 stable.  On Coumadin for atrial fibrillation (HCC) 09-12-2023 on coumadin. Pharmacy managing INR.  Chronic anxiety 09-12-2023 chronic. Have placated her by keeping her in the hospital overnight to repeat CXR in AM. Plan for DC in AM.  Atrial fibrillation (HCC) 09-12-2023 stable.   DVT prophylaxis:  warfarin (COUMADIN) tablet 2 mg     Code Status: Full Code Family Communication: no family at bedside Disposition Plan: return home Reason for continuing need for hospitalization: medically stable for DC. Plan for DC in AM.  Objective: Vitals:   09/12/23 0700 09/12/23 0828 09/12/23 1200 09/12/23 1454  BP:  (!) 134/45 (!) 143/49   Pulse:  (!) 53 (!) 52   Resp:  17 17   Temp:  98.4 F (36.9 C) 98.6 F (37 C)   TempSrc:  Oral Oral   SpO2:  94% 97% 96%  Weight: 55.8 kg     Height:        Intake/Output Summary (Last 24 hours) at 09/12/2023 1458 Last data filed at 09/12/2023 1000 Gross per 24 hour  Intake 452.06 ml  Output 270 ml  Net 182.06 ml   Filed Weights   09/10/23 2015 09/12/23 0700  Weight: 55.8 kg 55.8 kg    Examination:  Physical Exam Vitals and nursing note reviewed.  Constitutional:      General: She is not in acute distress.    Appearance: She is not toxic-appearing or diaphoretic.     Comments: Chronically ill appearing  HENT:     Head: Normocephalic and atraumatic.     Nose: Nose normal.  Cardiovascular:     Rate and Rhythm: Normal rate and regular rhythm.  Pulmonary:     Effort: Pulmonary effort is normal. No respiratory distress.     Breath sounds: Normal breath sounds. No stridor. No wheezing.  Abdominal:     General: Abdomen is flat. Bowel sounds are normal. There is no distension.     Palpations: Abdomen is soft.  Musculoskeletal:     Right lower leg: No edema.     Left lower leg: No edema.  Skin:    General: Skin is warm and dry.     Capillary Refill: Capillary refill takes less than 2 seconds.   Neurological:     General: No focal deficit present.     Mental Status: She is oriented to person, place, and time.  Psychiatric:        Mood and Affect: Mood is anxious.     Data Reviewed: I have personally reviewed following labs and imaging studies  CBC: Recent Labs  Lab 09/10/23 2018 09/11/23 0922 09/12/23 0312  WBC 7.6 5.9 6.4  NEUTROABS  --   --  4.6  HGB 12.7 13.9 11.3*  HCT 37.1 41.0 33.1*  MCV 89.4 88.6 88.3  PLT 164 161 170   Basic Metabolic Panel: Recent Labs  Lab 09/10/23 2018 09/11/23 0922 09/12/23 0312  NA 141 139 136  K 3.3* 3.3* 3.2*  CL 101 99 104  CO2 31 27 25   GLUCOSE 111* 163* 104*  BUN 19 19 31*  CREATININE 0.94 1.02* 1.49*  CALCIUM 9.5 9.3 8.5*  MG  --  2.1  --   PHOS  --  4.0  --    GFR: Estimated Creatinine Clearance: 22.4 mL/min (A) (by C-G formula based on SCr of 1.49 mg/dL (H)). Coagulation Profile: Recent Labs  Lab 09/07/23 0000 09/10/23 2325 09/12/23 0312  INR 1.2* 1.5* 1.8*   BNP (last 3 results) Recent Labs    10/11/22 0957  BNP 181.8*   Sepsis Labs: Recent Labs  Lab 09/10/23 2325 09/11/23 0336  LATICACIDVEN 0.8 0.9    Recent Results (from the past 240 hours)  Resp panel by RT-PCR (RSV, Flu A&B, Covid) Anterior Nasal Swab     Status: Abnormal   Collection Time: 09/10/23  8:18 PM   Specimen: Anterior Nasal Swab  Result Value Ref Range Status   SARS Coronavirus 2 by RT PCR POSITIVE (A) NEGATIVE Final    Comment: (NOTE) SARS-CoV-2 target nucleic acids are DETECTED.  The SARS-CoV-2 RNA is generally detectable in upper respiratory specimens during the acute phase of infection. Positive results are indicative of the presence of the identified virus, but do not rule out bacterial infection or co-infection with other pathogens not detected by the test. Clinical correlation with patient history and other diagnostic information is necessary to determine patient infection status. The expected result is  Negative.  Fact Sheet for Patients: BloggerCourse.com  Fact Sheet for Healthcare Providers: SeriousBroker.it  This test is not yet approved or cleared by the [place] FDA and  has been authorized for detection and/or diagnosis of SARS-CoV-2 by FDA under an Emergency Use Authorization (EUA).  This EUA will remain in effect (meaning this test can be used) for the duration of  the COVID-19 declaration under Section 564(b)(1) of the A ct, 21 U.S.C. section 360bbb-3(b)(1), unless the authorization is terminated or revoked sooner.     Influenza A by PCR NEGATIVE NEGATIVE Final   Influenza B by PCR NEGATIVE NEGATIVE Final    Comment: (NOTE) The Xpert Xpress SARS-CoV-2/FLU/RSV plus assay is intended as an aid in the diagnosis of influenza from Nasopharyngeal swab specimens and should not be used as a sole basis for treatment. Nasal washings and aspirates are unacceptable for Xpert Xpress SARS-CoV-2/FLU/RSV testing.  Fact Sheet for Patients: BloggerCourse.com  Fact Sheet for Healthcare Providers: SeriousBroker.it  This test is not yet approved or cleared by the [place] FDA and has been authorized for detection and/or diagnosis of SARS-CoV-2 by FDA under an Emergency Use Authorization (EUA). This EUA will remain in effect (meaning this test can be used) for the duration of the COVID-19 declaration under Section 564(b)(1) of the Act, 21 U.S.C. section 360bbb-3(b)(1), unless the authorization is terminated or revoked.     Resp Syncytial Virus by PCR NEGATIVE NEGATIVE Final    Comment: (NOTE) Fact Sheet for Patients: BloggerCourse.com  Fact Sheet for Healthcare Providers: SeriousBroker.it  This test is not yet approved or cleared by the [place] FDA and has been authorized for detection and/or diagnosis of SARS-CoV-2  by FDA under an Emergency Use Authorization (EUA). This EUA will remain in effect (meaning this test can be used) for the duration of the COVID-19 declaration under Section 564(b)(1) of the Act, 21 U.S.C. section 360bbb-3(b)(1), unless the authorization is terminated or revoked.  Performed at [facility], [address], [place], [place] [address]   Blood culture (routine x 2)     Status: None (Preliminary result)   Collection Time: 09/10/23 11:07 PM   Specimen: BLOOD  Result Value Ref Range Status  Specimen Description   Final    BLOOD LEFT ANTECUBITAL Performed at [facility], [address], [place], [place] [address]    Special Requests   Final    BOTTLES DRAWN AEROBIC AND ANAEROBIC Blood Culture results may not be optimal due to an excessive volume of blood received in culture bottles Performed at [facility], [address], [place], [place] [address]    Culture   Final    NO GROWTH < 24 HOURS Performed at [facility], [address] [place], [place] [address]    Report Status PENDING  Incomplete  Blood culture (routine x 2)     Status: None (Preliminary result)   Collection Time: 09/10/23 11:12 PM   Specimen: BLOOD RIGHT FOREARM  Result Value Ref Range Status   Specimen Description   Final    BLOOD RIGHT FOREARM Performed at [facility], [address] [place], [place] [address]    Special Requests   Final    BOTTLES DRAWN AEROBIC AND ANAEROBIC Blood Culture results may not be optimal due to an excessive volume of blood received in culture bottles Performed at [facility], [address] [place], [place] [address]    Culture   Final    NO GROWTH < 24 HOURS Performed at [facility], [address], [place], [place] [address]    Report Status PENDING  Incomplete     Radiology Studies: DG CHEST PORT 1 VIEW Result Date: 09/11/2023 CLINICAL DATA:   Pneumothorax EXAM: PORTABLE CHEST 1 VIEW COMPARISON:  09/10/2023 FINDINGS: Small left pneumothorax has decreased in size from the prior study, now estimated at 5% volume. No mediastinal shift. Left basilar atelectasis. Stable heart size status post sternotomy and CABG. No pleural effusion. No right pneumothorax. IMPRESSION: Small left pneumothorax has decreased in size from the prior study, now estimated at 5% volume. Electronically Signed   By: Duanne Guess D.O.   On: 09/11/2023 12:04   CT Angio Chest PE W and/or Wo Contrast Result Date: 09/11/2023 CLINICAL DATA:  Dyspnea, left chest pain, COPD, CHF EXAM: CT ANGIOGRAPHY CHEST WITH CONTRAST TECHNIQUE: Multidetector CT imaging of the chest was performed using the standard protocol during bolus administration of intravenous contrast. Multiplanar CT image reconstructions and MIPs were obtained to evaluate the vascular anatomy. RADIATION DOSE REDUCTION: This exam was performed according to the departmental dose-optimization program which includes automated exposure control, adjustment of the mA and/or kV according to patient size and/or use of iterative reconstruction technique. CONTRAST:  75mL OMNIPAQUE IOHEXOL 350 MG/ML SOLN COMPARISON:  10/29/2021 FINDINGS: Cardiovascular: There is adequate opacification of the pulmonary arterial tree. No intraluminal filling defect identified to suggest acute pulmonary embolism. Central pulmonary arteries are mildly enlarged in keeping with changes of pulmonary arterial hypertension, stable since prior examination. Coronary artery bypass grafting and mitral valve replacement have been performed. Mild cardiomegaly with biatrial enlargement is present. No pericardial effusion. Mild atherosclerotic calcification within the thoracic aorta. No aortic aneurysm. Mediastinum/Nodes: No enlarged mediastinal, hilar, or axillary lymph nodes. Thyroid gland, trachea, and esophagus demonstrate no significant findings. Lungs/Pleura: Small  left pneumothorax is present. There is mild pneumomediastinum within the left anterolateral mediastinum. No significant mediastinal shift or hyperexpansion of the left hemithorax to suggest tension physiology. Lingular atelectasis. No confluent pulmonary infiltrate. No pleural effusion. Upper Abdomen: No acute abnormality. Musculoskeletal: Remote appearing T10 and T11 compression deformities are seen with mild loss of height. No acute bone abnormality. No lytic or  blastic bone lesion. Review of the MIP images confirms the above findings. IMPRESSION: 1. No pulmonary embolism. 2. Small left pneumothorax. Mild pneumomediastinum. No significant mediastinal shift or hyperexpansion of the left hemithorax to suggest tension physiology. 3. Mild cardiomegaly with biatrial enlargement. Status post coronary artery bypass grafting and mitral valve replacement. Aortic Atherosclerosis (ICD10-I70.0). Electronically Signed   By: Helyn Numbers M.D.   On: 09/11/2023 00:16   DG Chest Port 1 View Result Date: 09/10/2023 CLINICAL DATA:  Chest pain, COVID. EXAM: PORTABLE CHEST 1 VIEW COMPARISON:  Chest x-ray 01/12/2022 FINDINGS: There is a small left pneumothorax measuring 6 mm from the lung apex. There are minimal patchy opacities in the left costophrenic angle. No pleural effusion. The cardiomediastinal silhouette is stable. Patient is status post cardiac surgery. The heart is mildly enlarged. No acute fractures are seen. Cervical spinal fusion plate is present. IMPRESSION: 1. Small left pneumothorax measuring 6 mm from the lung apex. 2. Minimal patchy opacities in the left costophrenic angle, likely atelectasis. These results were called by telephone at the time of interpretation on 09/10/2023 at 10:46 pm to provider HAYLEY NAASZ , who verbally acknowledged these results. Electronically Signed   By: Darliss Cheney M.D.   On: 09/10/2023 22:46    Scheduled Meds:  diazepam  5 mg Oral QHS   potassium chloride  40 mEq Oral Once    warfarin  2 mg Oral ONCE-1600   Warfarin - Pharmacist Dosing Inpatient   Does not apply q1600   Continuous Infusions:  remdesivir 100 mg in sodium chloride 0.9 % 100 mL IVPB 100 mg (09/12/23 0928)     LOS: 1 day   Time spent: 40 minutes  Carollee Herter, DO  [facility]  09/12/2023, 2:58 PM

## 2023-09-12 NOTE — Plan of Care (Signed)
  Problem: Education: Goal: Knowledge of risk factors and measures for prevention of condition will improve Outcome: Progressing   Problem: Coping: Goal: Psychosocial and spiritual needs will be supported Outcome: Progressing   Problem: Respiratory: Goal: Will maintain a patent airway Outcome: Progressing Goal: Complications related to the disease process, condition or treatment will be avoided or minimized Outcome: Progressing   Problem: Education: Goal: Knowledge of General Education information will improve Description: Including pain rating scale, medication(s)/side effects and non-pharmacologic comfort measures Outcome: Progressing   Problem: Education: Goal: Knowledge of General Education information will improve Description: Including pain rating scale, medication(s)/side effects and non-pharmacologic comfort measures Outcome: Progressing   Problem: Health Behavior/Discharge Planning: Goal: Ability to manage health-related needs will improve Outcome: Progressing   Problem: Clinical Measurements: Goal: Ability to maintain clinical measurements within normal limits will improve Outcome: Progressing Goal: Will remain free from infection Outcome: Progressing Goal: Diagnostic test results will improve Outcome: Progressing Goal: Respiratory complications will improve Outcome: Progressing Goal: Cardiovascular complication will be avoided Outcome: Progressing

## 2023-09-12 NOTE — Assessment & Plan Note (Addendum)
09-12-2023 chronic. Have placated her by keeping her in the hospital overnight to repeat CXR in AM. Plan for DC in AM.  09-13-2023 stable.

## 2023-09-12 NOTE — Assessment & Plan Note (Addendum)
09-12-2023 stable. 09-13-2023 discussed with cards. Pt had been on coumadin in the past due to valvular afib from her mitral stenosis. Now that she has had mitral valve replacement with bioprosthetic valve, cards has decided to change her to Eliquis. This will eliminate her need to have INR checked for anticoagulation.

## 2023-09-12 NOTE — Assessment & Plan Note (Addendum)
09-12-2023 Scr up to 1.49 today. Known CKD stage 3a with baseline Scr 1-1.4.  holding Entresto now. No indications for diuresis despite what pt is stating. Pt does not have any flank edema. She says her abd is "hard" but in actuality her abd is soft and non-tender. No edema is noted. Do NOT give IV lasix.   09-13-2023 Scr back down to 1.32. will ask pt to hold bumex/metolazone until her PO intake is back to normal after she recovers from covid.

## 2023-09-12 NOTE — Hospital Course (Signed)
HPI: Catherine [name] is a 88 y.o. female with medical history significant for paroxysmal A-fib on Coumadin, severe mitral stenosis status post bioprosthetic mitral valve replacement, coronary artery disease status post single-vessel CABG, hypertension, hyperlipidemia with statin and Zetia intolerance, chronic HFpEF, chronic back pain with prior back surgeries, chronic insomnia, CKD 3A, who initially presented to [facility] ED with a constellation of complaints, subjective fevers, body aches, malaise, cough, chest pain and shortness of breath for the past 3-4 days.  Associated with pleuritic pain, worse with taking a deep breath.   In the ED, a chest x-ray showed small left pneumothorax measuring 6 mm from the lung apex.  The patient subsequently had a CT angio chest PE which was negative for pulmonary embolism, confirmed small left pneumothorax, mild pneumomediastinum with no significant mediastinal shift or hyperexpansion of the left hemithorax to suggest tension physiology.   EDP discussed the case with PCCM who recommended 100% FiO2 and repeat chest x-ray in 6 hours.  The patient COVID-19 screening test returned positive.   Significant Events: Admitted 09/10/2023 for left pneumothorax   Significant Labs: Covid PCR POSITIVE WBC 7.6, HgB 12.7, Na 141, K 3.3, BUN 19, Scr 0.94  Significant Imaging Studies: Admission CTPA shows No pulmonary embolism. 2. Small left pneumothorax. Mild pneumomediastinum. No significant mediastinal shift or hyperexpansion of the left hemithorax to suggest tension physiology. 3. Mild cardiomegaly with biatrial enlargement. Status post coronary artery bypass grafting and mitral valve replacement.  Antibiotic Therapy: Anti-infectives (From admission, onward)    Start     Dose/Rate Route Frequency Ordered Stop   09/12/23 1000  remdesivir 100 mg in sodium chloride 0.9 % 100 mL IVPB       Placed in "Followed by" Linked Group   100 mg 200 mL/hr over 30 Minutes Intravenous  Daily 09/11/23 0557 09/14/23 0959   09/11/23 0730  remdesivir 200 mg in sodium chloride 0.9% 250 mL IVPB       Placed in "Followed by" Linked Group   200 mg 580 mL/hr over 30 Minutes Intravenous Once 09/11/23 0557 09/11/23 1000       Procedures:   Consultants: PCCM

## 2023-09-12 NOTE — Progress Notes (Signed)
PCCM brief progress note  Chest x-ray from today at 11 AM shows continued improvement in her small left pneumothorax.  No other intervention needed.  Would recheck chest x-ray if she has any acute clinical change, dyspnea, pain.  Please call if we can help further  Levy Pupa, MD, PhD 09/12/2023, 1:33 PM [facility] [phone] or if no answer before 7:00PM call [phone] For any issues after 7:00PM please call eLink [phone]

## 2023-09-12 NOTE — Assessment & Plan Note (Addendum)
09-12-2023 pt is euvolemic. Maybe even slightly hypovolemic. Definitely does not need diuresis today.  09-13-2023 continue to hold diuresis until po intake is back to normal.

## 2023-09-12 NOTE — Subjective & Objective (Signed)
Pt seen and examined. Pt has LOTS of questions. They are all related to her chronic disease states. I told her I would not be able to answer her questions as I am not familiar with all of her medical issues.  She PCP and other specialists that see her regularly would be able to provide comment on her "prognosis".  Pt weaned off NRB for her nitrogen wash-out for her PTX.

## 2023-09-12 NOTE — Assessment & Plan Note (Addendum)
09-12-2023 pt without hypoxia. Treated with IV remdesivir. Continue supportive care. Will arrange for home nebulizer machine. Prn duonebs. 09-13-2023 DC to home. Supportive care. Prn duonebs.

## 2023-09-12 NOTE — Assessment & Plan Note (Addendum)
09-12-2023 repeat CXR after 100% NRB for nitrogen washout shows resolution of PTX. No need for chest tube. Will repeat CXR in AM to ensure stability. Pt is nervous about going home. Told her that she should be discharged today but I'm keeping her overnight out of an abundance of caution.  09-13-2023 resolved. PCCM has signed off.

## 2023-09-12 NOTE — Assessment & Plan Note (Addendum)
09-12-2023 replete with po kcl. Repeat CMP in AM.  09-13-2023 resolved.

## 2023-09-12 NOTE — Telephone Encounter (Signed)
Pt LVM reporting she has been admitted on 12/14 for positive COVID and pneumothorax. She is requesting to cancel her coumadin clinic apt for 12/18. She is also requesting to advised PCP that she is the hospital.  LVM for pt wishing her the best in her recovery. Coumadin clinic apt has been cancelled  Forwarding to PCP.

## 2023-09-13 ENCOUNTER — Inpatient Hospital Stay (HOSPITAL_COMMUNITY)
Admit: 2023-09-13 | Discharge: 2023-09-13 | Disposition: A | Discharge status: Home / Self Care | Payer: Medicare Other | Attending: Student

## 2023-09-13 ENCOUNTER — Telehealth (HOSPITAL_COMMUNITY): Payer: Self-pay | Admitting: Pharmacy Technician

## 2023-09-13 ENCOUNTER — Other Ambulatory Visit (HOSPITAL_COMMUNITY): Payer: Self-pay

## 2023-09-13 ENCOUNTER — Inpatient Hospital Stay (HOSPITAL_COMMUNITY): Payer: Medicare Other

## 2023-09-13 DIAGNOSIS — I455 Other specified heart block: Secondary | ICD-10-CM

## 2023-09-13 DIAGNOSIS — N1831 Chronic kidney disease, stage 3a: Secondary | ICD-10-CM | POA: Diagnosis not present

## 2023-09-13 DIAGNOSIS — U071 COVID-19: Secondary | ICD-10-CM | POA: Diagnosis not present

## 2023-09-13 DIAGNOSIS — I480 Paroxysmal atrial fibrillation: Secondary | ICD-10-CM | POA: Diagnosis not present

## 2023-09-13 DIAGNOSIS — Z7901 Long term (current) use of anticoagulants: Secondary | ICD-10-CM

## 2023-09-13 DIAGNOSIS — I4811 Longstanding persistent atrial fibrillation: Secondary | ICD-10-CM

## 2023-09-13 DIAGNOSIS — J939 Pneumothorax, unspecified: Secondary | ICD-10-CM | POA: Diagnosis not present

## 2023-09-13 LAB — COMPREHENSIVE METABOLIC PANEL
ALT: 11 U/L (ref 0–44)
AST: 22 U/L (ref 15–41)
Albumin: 2.8 g/dL — ABNORMAL LOW (ref 3.5–5.0)
Alkaline Phosphatase: 53 U/L (ref 38–126)
Anion gap: 8 (ref 5–15)
BUN: 32 mg/dL — ABNORMAL HIGH (ref 8–23)
CO2: 24 mmol/L (ref 22–32)
Calcium: 8.3 mg/dL — ABNORMAL LOW (ref 8.9–10.3)
Chloride: 107 mmol/L (ref 98–111)
Creatinine, Ser: 1.32 mg/dL — ABNORMAL HIGH (ref 0.44–1.00)
GFR, Estimated: 39 mL/min — ABNORMAL LOW (ref >60)
Glucose, Bld: 96 mg/dL (ref 70–99)
Potassium: 3.5 mmol/L (ref 3.5–5.1)
Sodium: 139 mmol/L (ref 135–145)
Total Bilirubin: 0.4 mg/dL (ref <1.2)
Total Protein: 5.1 g/dL — ABNORMAL LOW (ref 6.5–8.1)

## 2023-09-13 LAB — MAGNESIUM: Magnesium: 2.1 mg/dL (ref 1.7–2.4)

## 2023-09-13 LAB — PROTIME-INR
INR: 2.1 — ABNORMAL HIGH (ref 0.8–1.2)
Prothrombin Time: 24.1 s — ABNORMAL HIGH (ref 11.4–15.2)

## 2023-09-13 MED ORDER — APIXABAN 5 MG PO TABS: 5.0000 mg | ORAL_TABLET | Freq: Two times a day (BID) | ORAL | 0 refills | Status: DC

## 2023-09-13 MED ORDER — POTASSIUM CHLORIDE CRYS ER 20 MEQ PO TBCR
40.0000 meq | EXTENDED_RELEASE_TABLET | ORAL | Status: AC
  Administered 2023-09-13 (×2): 40 meq via ORAL
  Filled 2023-09-13 (×2): qty 2

## 2023-09-13 MED ORDER — APIXABAN 2.5 MG PO TABS: 2.5000 mg | ORAL_TABLET | Freq: Two times a day (BID) | ORAL | 0 refills | Status: DC

## 2023-09-13 MED ORDER — IPRATROPIUM-ALBUTEROL 0.5-2.5 (3) MG/3ML IN SOLN: 3.0000 mL | RESPIRATORY_TRACT | 0 refills | Status: DC | PRN

## 2023-09-13 NOTE — Assessment & Plan Note (Signed)
09-13-2023 discussed with cards. Pt had been on coumadin in the past due to valvular afib from her mitral stenosis. Now that she has had mitral valve replacement with bioprosthetic valve, cards has decided to change her to Eliquis. This will eliminate her need to have INR checked for anticoagulation.

## 2023-09-13 NOTE — TOC Transition Note (Signed)
Transition of Care (TOC) - Discharge Note Donn Pierini RN, BSN Transitions of Care Unit 4E- RN Case Manager See Treatment Team for direct phone #   Patient Details  Name: Catherine [name] MRN: [id number] Date of Birth: 1935/02/27  Transition of [facility]) CM/SW Contact:  Darrold Span, RN Phone Number: 09/13/2023, 12:13 PM   Clinical Narrative:    Pt stable for transition home today, order for home nebulizer noted- referral sent to [employer] for DME need- nebulizer to be delivered to pt's home this afternoon.   No further TOC needs noted. Family to transport home.   Pt has PCP and will need to make appointment for f/u within 1-2 weeks post discharge.    Final next level of care: Home/Self Care Barriers to Discharge: No Barriers Identified   Patient Goals and CMS Choice Patient states their goals for this hospitalization and ongoing recovery are:: return home          Discharge Placement               Home        Discharge Plan and Services Additional resources added to the After Visit Summary for     Discharge Planning Services: CM Consult Post Acute Care Choice: Durable Medical Equipment          DME Arranged: Nebulizer machine DME Agency: [employer] Date DME Agency Contacted: 09/13/23 Time DME Agency Contacted: 1212 Representative spoke with at DME Agency: [facility] HH Arranged: NA HH Agency: NA        Social Drivers of Health (SDOH) Interventions SDOH Screenings   Food Insecurity: No Food Insecurity (09/11/2023)  Housing: Unknown (09/12/2023)  Transportation Needs: No Transportation Needs (09/11/2023)  Utilities: Not At Risk (09/11/2023)  Alcohol Screen: Low Risk  (11/06/2021)  Depression (PHQ2-9): Low Risk  (09/01/2023)  Financial Resource Strain: Low Risk  (04/09/2022)  Physical Activity: Insufficiently Active (04/09/2022)  Social Connections: Socially Integrated (04/09/2022)  Stress: No Stress Concern Present (04/09/2022)  Tobacco Use: Medium  Risk (09/10/2023)     Readmission Risk Interventions    09/13/2023   12:13 PM  Readmission Risk Prevention Plan  Transportation Screening Complete  HRI or Home Care Consult Complete  Palliative Care Screening Not Applicable  Medication Review (RN Care Manager) Complete

## 2023-09-13 NOTE — Discharge Instructions (Signed)
Information on my medicine - ELIQUIS (apixaban)  This medication education was reviewed with me or my healthcare representative as part of my discharge preparation.     Why was Eliquis prescribed for you? Eliquis was prescribed for you to reduce the risk of a blood clot forming that can cause a stroke if you have a medical condition called atrial fibrillation (a type of irregular heartbeat).  What do You need to know about Eliquis ? Take your Eliquis TWICE DAILY - one tablet in the morning and one tablet in the evening with or without food. If you have difficulty swallowing the tablet whole please discuss with your pharmacist how to take the medication safely.  Take Eliquis exactly as prescribed by your doctor and DO NOT stop taking Eliquis without talking to the doctor who prescribed the medication.  Stopping may increase your risk of developing a stroke.  Refill your prescription before you run out.  After discharge, you should have regular check-up appointments with your healthcare provider that is prescribing your Eliquis.  In the future your dose may need to be changed if your kidney function or weight changes by a significant amount or as you get older.  What do you do if you miss a dose? If you miss a dose, take it as soon as you remember on the same day and resume taking twice daily.  Do not take more than one dose of ELIQUIS at the same time to make up a missed dose.  Important Safety Information A possible side effect of Eliquis is bleeding. You should call your healthcare provider right away if you experience any of the following: Bleeding from an injury or your nose that does not stop. Unusual colored urine (red or dark brown) or unusual colored stools (red or black). Unusual bruising for unknown reasons. A serious fall or if you hit your head (even if there is no bleeding).  Some medicines may interact with Eliquis and might increase your risk of bleeding or clotting  while on Eliquis. To help avoid this, consult your healthcare provider or pharmacist prior to using any new prescription or non-prescription medications, including herbals, vitamins, non-steroidal anti-inflammatory drugs (NSAIDs) and supplements.  This website has more information on Eliquis (apixaban): http://www.eliquis.com/eliquis/home =======================================  Atrial Fibrillation    Atrial fibrillation is a type of heartbeat that is irregular or fast. If you have this condition, your heart beats without any order. This makes it hard for your heart to pump blood in a normal way. Atrial fibrillation may come and go, or it may become a long-lasting problem. If this condition is not treated, it can put you at higher risk for stroke, heart failure, and other heart problems.  What are the causes? This condition may be caused by diseases that damage the heart. They include: High blood pressure. Heart failure. Heart valve disease. Heart surgery. Other causes include: Diabetes. Thyroid disease. Being overweight. Kidney disease. Sometimes the cause is not known.  What increases the risk? You are more likely to develop this condition if: You are older. You smoke. You exercise often and very hard. You have a family history of this condition. You are a man. You use drugs. You drink a lot of alcohol. You have lung conditions, such as emphysema, pneumonia, or COPD. You have sleep apnea.  What are the signs or symptoms? Common symptoms of this condition include: A feeling that your heart is beating very fast. Chest pain or discomfort. Feeling short of breath. Suddenly feeling   light-headed or weak. Getting tired easily during activity. Fainting. Sweating. In some cases, there are no symptoms.  How is this treated? Treatment for this condition depends on underlying conditions and how you feel when you have atrial fibrillation. They include: Medicines to: Prevent  blood clots. Treat heart rate or heart rhythm problems. Using devices, such as a pacemaker, to correct heart rhythm problems. Doing surgery to remove the part of the heart that sends bad signals. Closing an area where clots can form in the heart (left atrial appendage). In some cases, your doctor will treat other underlying conditions.  Follow these instructions at home:  Medicines Take over-the-counter and prescription medicines only as told by your doctor. Do not take any new medicines without first talking to your doctor. If you are taking blood thinners: Talk with your doctor before you take any medicines that have aspirin or NSAIDs, such as ibuprofen, in them. Take your medicine exactly as told by your doctor. Take it at the same time each day. Avoid activities that could hurt or bruise you. Follow instructions about how to prevent falls. Wear a bracelet that says you are taking blood thinners. Or, carry a card that lists what medicines you take. Lifestyle         Do not use any products that have nicotine or tobacco in them. These include cigarettes, e-cigarettes, and chewing tobacco. If you need help quitting, ask your doctor. Eat heart-healthy foods. Talk with your doctor about the right eating plan for you. Exercise regularly as told by your doctor. Do not drink alcohol. Lose weight if you are overweight. Do not use drugs, including cannabis.  General instructions If you have a condition that causes breathing to stop for a short period of time (apnea), treat it as told by your doctor. Keep a healthy weight. Do not use diet pills unless your doctor says they are safe for you. Diet pills may make heart problems worse. Keep all follow-up visits as told by your doctor. This is important.  Contact a doctor if: You notice a change in the speed, rhythm, or strength of your heartbeat. You are taking a blood-thinning medicine and you get more bruising. You get tired more easily  when you move or exercise. You have a sudden change in weight.  Get help right away if:    You have pain in your chest or your belly (abdomen). You have trouble breathing. You have side effects of blood thinners, such as blood in your vomit, poop (stool), or pee (urine), or bleeding that cannot stop. You have any signs of a stroke. "BE FAST" is an easy way to remember the main warning signs: B - Balance. Signs are dizziness, sudden trouble walking, or loss of balance. E - Eyes. Signs are trouble seeing or a change in how you see. F - Face. Signs are sudden weakness or loss of feeling in the face, or the face or eyelid drooping on one side. A - Arms. Signs are weakness or loss of feeling in an arm. This happens suddenly and usually on one side of the body. S - Speech. Signs are sudden trouble speaking, slurred speech, or trouble understanding what people say. T - Time. Time to call emergency services. Write down what time symptoms started. You have other signs of a stroke, such as: A sudden, very bad headache with no known cause. Feeling like you may vomit (nausea). Vomiting. A seizure.  These symptoms may be an emergency. Do not wait to   see if the symptoms will go away. Get medical help right away. Call your local emergency services (911 in the [place] Do not drive yourself to the hospital. Summary Atrial fibrillation is a type of heartbeat that is irregular or fast. You are at higher risk of this condition if you smoke, are older, have diabetes, or are overweight. Follow your doctor's instructions about medicines, diet, exercise, and follow-up visits. Get help right away if you have signs or symptoms of a stroke. Get help right away if you cannot catch your breath, or you have chest pain or discomfort. This information is not intended to replace advice given to you by your health care provider. Make sure you discuss any questions you have with your health care provider. Document  Revised: 03/07/2019 Document Reviewed: 03/07/2019 Elsevier Patient Education  2020 [employer]    

## 2023-09-13 NOTE — Plan of Care (Signed)
  Problem: Education: Goal: Knowledge of risk factors and measures for prevention of condition will improve Outcome: Progressing   Problem: Coping: Goal: Psychosocial and spiritual needs will be supported Outcome: Progressing   Problem: Respiratory: Goal: Will maintain a patent airway Outcome: Progressing   

## 2023-09-13 NOTE — Assessment & Plan Note (Signed)
09-13-2023 consulted cardiology about 4 sec sinus pauses. Pt not on any AV nodal blockers. Pt will get 14 day ziopatch vs live monitor and f/u with cardiology in clinic.

## 2023-09-13 NOTE — Consult Note (Signed)
Cardiology Consultation   Patient ID: [name] MRN: [id number]; DOB: 07-25-1935  Admit date: 09/10/2023 Date of Consult: 09/13/2023  PCP:  Kristian Covey, MD   [facility] Providers Cardiologist:  Chilton Si, MD        Patient Profile:   Catherine [name] is a 88 y.o. 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 Imogene Burn.  History of Present Illness:   [name] was admitted 12/15 with shortness of breath and chest pain in the setting of COVID-19 infection.  She went to the ED with 3 days of fever, body aches, cough and shortness of breath.  She reported pleuritic CP.  CXR revealed a small L pneumothorax at the apex.  100% FiO2 oxygen was recommended.  COVID-19 testing was positive.  She was admitted to the hospitalist service and cardiology was consulted for 4s sinus pause on telemetry. She reports that she was awake and asymptomatic.  She denies any lightheadedness or dizziness.   [name] underwent mitral commissurotomy in the 1970s.  She was found to have severe mitral stenosis with a 13 mmHg mean gradient, MVA 0.99 cm^2. She also had 50% LCx and 80% RCA lesions.  She had a 29 mm bioprosthetic mitral valve, single vessel CABG and MAZE with Dr. Cornelius Moras on 10/14/16.  She was referred for a baseline echo 02/15/17 that revealed LVEF 60-65% with mild aortic stenosis (mean gradient 12 mmHg).  Her bioprosthetic mitral valve was functioning well.     After her surgery [name] continued to report fatigue.  It was unclear whether she was in atrial fibrillation or sinus rhythm with a long PR interval.  She was referred to the atrial fibrillation clinic where this remained unclear.  However, it was felt that if she were in atrial  fibrillation it would not be possible to maintain sinus rhythm.  She developed lower extremity edema. She stopped amlodipine and this improved. This was switched to irbesartan.  She was switched from lasix to torsemide which helped her abdominal bloating but she still had LE edema. She also reported dizziness and had a repeat echo 06/2021 with LV 60-65%. Her bioprosthetic mitral valve was functioning properly. She had moderate to severe tricuspid regurgitation and PASP was .    She was hospitalized 12/2020 with acute on chronic diastolic heart failure and COPD.  She was diuresed with IV Lasix and started on low-dose losartan.  In the hospital she was noted to have episodes of A-fib with bradycardia.  Her heart rates dropped into the 40s and she had up to 2.4-second pauses on metoprolol.  Her metoprolol dose was reduced.  She followed up in advanced heart failure clinic 12/2021 and was feeling much better.  She saw Gillian Shields, NP 01/2022 and her dyspnea was improving.  She was started on amiodarone. It made her feel poorly, she said her hair was falling out, and it affected her coumadin levels. Amiodarone was discontinued. Torsemide was switched to Bumex. Losartan was switched to [facility].  She had a good response to metolazone, so it was added once per week.    Due to low BP and overdiureiss, Bumex was reduced to once daily. Metolazone was changed to prn for edema, shortness of breath, or other heart failure symptoms.  At her visit 06/2022 she was not feeling well.  She also noted weight gain and recurrent UTIs.  Therefore she was not started on SGLT2 inhibitor.  Repeat echo 10/2022 revealed LVEF 60-65% with moderately reduced RV function.  Mitral valve prosthesis was appropriately functioning and she had mild AS with mean gradient of 11 mmHg.  Metoprolol was reduced due to bradycardia.  Past Medical History:  Diagnosis Date   Acute on chronic diastolic heart failure (HCC)    AKI (acute kidney injury)  (HCC) 06/22/2022   Aortic stenosis, mild 07/26/2017   Arthritis    Asthma    last attack 02/2015   Atrial fibrillation, chronic (HCC)    Cellulitis of left lower extremity 08/17/2016   Ulcer associated with severe venous insufficiency   Chronic anticoagulation    Chronic diastolic CHF (congestive heart failure) (HCC)    Chronic kidney disease    "RIGHT MANY KIDNEY INFECTIONS AND STONES"   COPD (chronic obstructive pulmonary disease) (HCC)    Coronary artery disease    Dizziness    H/O: rheumatic fever    Heart murmur    Hypertension    PONV (postoperative nausea and vomiting)    ' SOMETIMES', BUT NOT ALWAYS"   Primary hypertension    RHEUMATIC MITRAL STENOSIS 01/05/2008   Qualifier: Diagnosis of   By: Maple Hudson MD, Clinton D        S/P Maze operation for atrial fibrillation 10/14/2016   Complete bilateral atrial lesion set using cryothermy and bipolar radiofrequency ablation - atrial appendage was not treated due to previous surgical procedure (open mitral commissurotomy)   S/P mitral valve replacement with bioprosthetic valve 10/14/2016   29 mm Medtronic Mosaic porcine bioprosthetic tissue valve   Sciatica 08/29/2023   Tricuspid regurgitation 07/14/2021   UTI (urinary tract infection)    Valvular heart disease    Has mitral stenosis with prior mitral commissurotomy in 1970    Past Surgical History:  Procedure Laterality Date   ABDOMINAL HYSTERECTOMY  1983   endometriosis   APPENDECTOMY     BACK SURGERY     neurosurgery x2   CARDIAC CATHETERIZATION     CARDIAC CATHETERIZATION N/A 08/03/2016   Procedure: Right/Left Heart Cath and Coronary Angiography;  Surgeon: Peter M Swaziland, MD;  Location: [facility] INVASIVE CV LAB;  Service: Cardiovascular;  Laterality: N/A;   cataract surg     CHOLECYSTECTOMY N/A 05/30/2015   Procedure: LAPAROSCOPIC CHOLECYSTECTOMY WITH INTRAOPERATIVE CHOLANGIOGRAM;  Surgeon: Glenna Fellows, MD;  Location: [facility] OR;  Service: General;  Laterality: N/A;    COLONOSCOPY     CORONARY ARTERY BYPASS GRAFT N/A 10/14/2016   Procedure: CORONARY ARTERY BYPASS GRAFTING (CABG);  Surgeon: Purcell Nails, MD;  Location: [facility] OR;  Service: Open Heart Surgery;  Laterality: N/A;   EYE SURGERY     MAZE N/A 10/14/2016   Procedure: MAZE;  Surgeon: Purcell Nails, MD;  Location: [facility] OR;  Service: Open Heart Surgery;  Laterality: N/A;   MITRAL VALVE REPLACEMENT N/A 10/14/2016   Procedure: REDO MITRAL VALVE REPLACEMENT (MVR);  Surgeon: Purcell Nails, MD;  Location: [facility] OR;  Service: Open Heart Surgery;  Laterality: N/A;   MITRAL VALVE SURGERY Left 1970   Open mitral commissurotomy via left thoracotomy approach   TEE WITHOUT CARDIOVERSION N/A 07/05/2016   Procedure: TRANSESOPHAGEAL ECHOCARDIOGRAM (TEE);  Surgeon: Chilton Si, MD;  Location: [facility];  Service: Cardiovascular;  Laterality: N/A;   TEE WITHOUT CARDIOVERSION N/A 10/14/2016   Procedure: TRANSESOPHAGEAL ECHOCARDIOGRAM (TEE);  Surgeon: Marilu Favre  Zadie Cleverly, MD;  Location: [facility] OR;  Service: Open Heart Surgery;  Laterality: N/A;     Home Medications:  Prior to Admission medications   Medication Sig Start Date End Date Taking? Authorizing Provider  acetaminophen (TYLENOL) 500 MG tablet Take 1,000 mg by mouth every 6 (six) hours as needed for mild pain.   Yes [provider]  albuterol (VENTOLIN HFA) 108 (90 Base) MCG/ACT inhaler INHALE 2 PUFFS INTO THE LUNGS EVERY 4 HOURS AS NEEDED FOR AHEEZING OR SHORTNESS OF BREATH 09/02/22  Yes Coralyn Helling, MD  bumetanide (BUMEX) 2 MG tablet Please take 2mg  daily in the morning and then 2mg  in the afternoon on MONDAY, [facility] AND FRIDAY 10/12/22  Yes Alver Sorrow, NP  cholecalciferol (VITAMIN D3) 25 MCG (1000 UNIT) tablet Take 1,000 Units by mouth daily.   Yes [provider]  diazepam (VALIUM) 5 MG tablet Take 1 tablet (5 mg total) by mouth at bedtime. 05/05/23  Yes Burchette, Elberta Fortis, MD  estradiol (ESTRACE VAGINAL) 0.1 MG/GM vaginal cream USE 2 grams  per vagina daily for 2 weeks and then 1 gram per vagina three times per week. Patient taking differently: USE 1 gram per vagina three times per week PRN 07/27/22  Yes Burchette, Elberta Fortis, MD  fluticasone (FLONASE) 50 MCG/ACT nasal spray Place 1 spray into both nostrils daily as needed for allergies or rhinitis. 08/30/22  Yes Coralyn Helling, MD  HYDROcodone-acetaminophen (NORCO/VICODIN) 5-325 MG tablet TAKE ONE TABLET EVERY 8 HOURS AS NEEDED FOR PAIN Patient taking differently: Take 1 tablet by mouth every 8 (eight) hours as needed for moderate pain (pain score 4-6). TAKE ONE TABLET EVERY 8 HOURS AS NEEDED FOR PAIN 08/29/23  Yes Burchette, Elberta Fortis, MD  hydrocortisone (ANUSOL-HC) 2.5 % rectal cream Place 1 Application rectally 2 (two) times daily. 02/28/23  Yes Henderson Cloud, MD  metolazone (ZAROXOLYN) 5 MG tablet TAKE ONE TABLET AS NEEDED ONCE A WEEK 30 MINUTES BEFORE BUMEX DOSE. AS NEEDED ONLY FOR WEIGHT GAIN 2LBS OVERNIGHT OR 5 LBS IN ONE WEEK 10/11/22  Yes Alver Sorrow, NP  metoprolol succinate (TOPROL-XL) 25 MG 24 hr tablet TAKE 1/2 TABLET DAILY 08/19/23  Yes Alver Sorrow, NP  Multiple Vitamins-Minerals (HAIR/SKIN/NAILS/BIOTIN PO) Take 1 tablet by mouth daily.   Yes [provider]  Multiple Vitamins-Minerals (MULTIVITAMIN ADULT EXTRA C) CHEW Chew 2 tablets by mouth daily.   Yes [provider]  phenazopyridine (PYRIDIUM) 200 MG tablet Take 1 tablet (200 mg total) by mouth 3 (three) times daily as needed (urine pain). 04/04/23  Yes Nelwyn Salisbury, MD  potassium chloride (KLOR-CON) 10 MEQ tablet TAKE UP TO 8 TABLETS DAILY AS DIRECTED Patient taking differently: Take 80 mEq by mouth daily. 07/15/23  Yes Chilton Si, MD  sacubitril-valsartan (ENTRESTO) 24-26 MG Take 1 tablet by mouth 2 (two) times daily. 07/27/23  Yes Chilton Si, MD  terconazole (TERAZOL 7) 0.4 % vaginal cream Place 1 applicator vaginally at bedtime. 07/11/23  Yes Swaziland, Betty G, MD   trimethoprim-polymyxin b (POLYTRIM) ophthalmic solution Place 2 drops into both eyes every 4 (four) hours. 12/15/22  Yes Burchette, Elberta Fortis, MD  warfarin (COUMADIN) 2 MG tablet TAKE 1 TABLET BY MOUTH DAILY OR AS DIRECTED BY ANTICOAGULATION CLINIC 07/06/23  Yes Burchette, Elberta Fortis, MD  ipratropium-albuterol (DUONEB) 0.5-2.5 (3) MG/3ML SOLN Take 3 mLs by nebulization every 4 (four) hours as needed. 09/13/23 10/13/23  Carollee Herter, DO    Inpatient Medications: Scheduled Meds:  diazepam  5 mg Oral QHS   ipratropium-albuterol  3 mL Nebulization BID   potassium chloride  40 mEq Oral Q4H   Warfarin - Pharmacist Dosing Inpatient   Does not apply q1600   Continuous Infusions:  PRN Meds: acetaminophen, guaiFENesin, ipratropium-albuterol, melatonin, polyethylene glycol, prochlorperazine  Allergies:    Allergies  Allergen Reactions   Aldactone [Spironolactone] Other (See Comments)    dyspnea   Amoxicillin Palpitations    Tachycardia Has patient had a PCN reaction causing immediate rash, facial/tongue/throat swelling, SOB or lightheadedness with hypotension: no Has patient had a PCN reaction causing severe rash involving mucus membranes or skin necrosis: {no Has patient had a PCN reaction that required hospitalization {no Has patient had a PCN reaction occurring within the last 10 years: {yes If all of the above answers are "NO", then may proceed with Cephalosporin use.   Diltiazem Other (See Comments)    Causing headaches    Flagyl [Metronidazole Hcl] Other (See Comments)    Causing headaches     Flovent [Fluticasone Propionate] Other (See Comments)    Leg cramps   Gabapentin Swelling   Lyrica [Pregabalin] Swelling   Quinidine Diarrhea and Other (See Comments)    Fever diarrhea   Simvastatin Other (See Comments)    Leg pain, myalgia   Tramadol Nausea Only   Verapamil Other (See Comments)    myalgias   Amlodipine     Low extremity edema   Crestor [Rosuvastatin]     myalgia   Livalo  [Pitavastatin]     myalgias   Polymyxin B-Trimethoprim Swelling   Zetia [Ezetimibe]     LEG CRAMPS    Benazepril Hcl Cough   Ciprofloxacin Diarrhea   Codeine Nausea Only   Nitrofurantoin Monohyd Macro Nausea Only    Social History:   Social History   Socioeconomic History   Marital status: Married    Spouse name: Not on file   Number of children: Not on file   Years of education: Not on file   Highest education level: Not on file  Occupational History   Not on file  Tobacco Use   Smoking status: Former    Current packs/day: 0.00    Average packs/day: 1 pack/day for 15.0 years (15.0 ttl pk-yrs)    Types: Cigarettes    Start date: 09/27/1960    Quit date: 09/28/1975    Years since quitting: 47.9   Smokeless tobacco: Never  Vaping Use   Vaping status: Never Used  Substance and Sexual Activity   Alcohol use: No    Alcohol/week: 0.0 standard drinks of alcohol   Drug use: No   Sexual activity: Yes  Other Topics Concern   Not on file  Social History Narrative   Not on file   Social Drivers of Health   Financial Resource Strain: Low Risk  (04/09/2022)   Overall Financial Resource Strain (CARDIA)    Difficulty of Paying Living Expenses: Not hard at all  Food Insecurity: No Food Insecurity (09/11/2023)   Hunger Vital Sign    Worried About Running Out of Food in the Last Year: Never true    Ran Out of Food in the Last Year: Never true  Transportation Needs: No Transportation Needs (09/11/2023)   PRAPARE - Administrator, Civil Service (Medical): No    Lack of Transportation (Non-Medical): No  Physical Activity: Insufficiently Active (04/09/2022)   Exercise Vital Sign    Days of Exercise per Week: 5 days    Minutes of Exercise  per Session: 20 min  Stress: No Stress Concern Present (04/09/2022)   [employer] of Occupational Health - Occupational Stress Questionnaire    Feeling of Stress : Not at all  Social Connections: Socially Integrated (04/09/2022)    Social Connection and Isolation Panel [NHANES]    Frequency of Communication with Friends and Family: More than three times a week    Frequency of Social Gatherings with Friends and Family: More than three times a week    Attends Religious Services: More than 4 times per year    Active Member of [employer] or Organizations: Yes    Attends Engineer, structural: More than 4 times per year    Marital Status: Married  Catering manager Violence: Not At Risk (09/11/2023)   Humiliation, Afraid, Rape, and Kick questionnaire    Fear of Current or Ex-Partner: No    Emotionally Abused: No    Physically Abused: No    Sexually Abused: No    Family History:    Family History  Problem Relation Age of Onset   Leukemia Father    Breast cancer Neg Hx      ROS:  Please see the history of present illness.   All other ROS reviewed and negative.     Physical Exam/Data:   Vitals:   09/13/23 0200 09/13/23 0706 09/13/23 0730 09/13/23 0832  BP: (!) 125/56     Pulse: (!) 55   67  Resp: (!) 25   20  Temp:    98.7 F (37.1 C)  TempSrc:    Oral  SpO2: 93%  95%   Weight:  55.9 kg    Height:        Intake/Output Summary (Last 24 hours) at 09/13/2023 1003 Last data filed at 09/13/2023 0839 Gross per 24 hour  Intake 600 ml  Output 260 ml  Net 340 ml      09/13/2023    7:06 AM 09/12/2023    7:00 AM 09/10/2023    8:15 PM  Last 3 Weights  Weight (lbs) 123 lb 3.8 oz 123 lb 0.3 oz 123 lb  Weight (kg) 55.9 kg 55.8 kg 55.792 kg     Body mass index is 21.83 kg/m.  General:  Well nourished, well developed, in no acute distress HEENT: normal Neck: no JVD Vascular: No carotid bruits; Distal pulses 2+ bilaterally Cardiac:  normal S1, S2; RRR; no murmur  Lungs:  clear to auscultation bilaterally, no wheezing, rhonchi or rales  Abd: soft, nontender, no hepatomegaly  Ext: no edema Musculoskeletal:  No deformities, BUE and BLE strength normal and equal Skin: warm and dry  Neuro:  CNs 2-12  intact, no focal abnormalities noted Psych:  Normal affect   EKG:  The EKG was personally reviewed and demonstrates:  Sinus rhythm.  Long first degree AV block.  HR 59 bpm.  PAC Telemetry:  Telemetry was personally reviewed and demonstrates:  Sinus rhythm.  Long first degree AV block.  PACs.  SVT.  4.15s sinus arrest with ventricular escape  Relevant CV Studies:   Laboratory Data:  High Sensitivity Troponin:   Recent Labs  Lab 09/10/23 2018 09/10/23 2325  TROPONINIHS 30* 34*     Chemistry Recent Labs  Lab 09/11/23 0922 09/12/23 0312 09/13/23 0323  NA 139 136 139  K 3.3* 3.2* 3.5  CL 99 104 107  CO2 27 25 24   GLUCOSE 163* 104* 96  BUN 19 31* 32*  CREATININE 1.02* 1.49* 1.32*  CALCIUM 9.3 8.5* 8.3*  MG 2.1  --  2.1  GFRNONAA 54* 34* 39*  ANIONGAP 13 7 8     Recent Labs  Lab 09/13/23 0323  PROT 5.1*  ALBUMIN 2.8*  AST 22  ALT 11  ALKPHOS 53  BILITOT 0.4   Lipids No results for input(s): "CHOL", "TRIG", "HDL", "LABVLDL", "LDLCALC", "CHOLHDL" in the last 168 hours.  Hematology Recent Labs  Lab 09/10/23 2018 09/11/23 0922 09/12/23 0312  WBC 7.6 5.9 6.4  RBC 4.15 4.63 3.75*  HGB 12.7 13.9 11.3*  HCT 37.1 41.0 33.1*  MCV 89.4 88.6 88.3  MCH 30.6 30.0 30.1  MCHC 34.2 33.9 34.1  RDW 12.9 12.8 13.2  PLT 164 161 170   Thyroid No results for input(s): "TSH", "FREET4" in the last 168 hours.  BNPNo results for input(s): "BNP", "PROBNP" in the last 168 hours.  DDimer No results for input(s): "DDIMER" in the last 168 hours.   Radiology/Studies:  DG CHEST PORT 1 VIEW Result Date: 09/13/2023 CLINICAL DATA:  Short of breath, chest pain, fever EXAM: PORTABLE CHEST 1 VIEW COMPARISON:  09/12/2023 FINDINGS: Single frontal view of the chest demonstrates postsurgical changes from CABG. The cardiac silhouette is stable. Stable left apical pneumothorax volume estimated far less than 5%. Stable small left pleural effusion versus pleural thickening. Right chest is clear. No  acute bony abnormalities. IMPRESSION: 1. Stable trace residual left apical pneumothorax. 2. Stable small left pleural effusion versus pleural thickening. Electronically Signed   By: Sharlet Salina M.D.   On: 09/13/2023 09:28   DG Chest Port 1 View Result Date: 09/12/2023 CLINICAL DATA:  Primary spontaneous pneumothorax. Chest pain and shortness of breath. EXAM: PORTABLE CHEST 1 VIEW COMPARISON:  Radiograph and CT yesterday FINDINGS: Left pneumothorax is diminished in size with only a tiny apical component persisting. Stable ill-defined basilar opacity and blunting of the costophrenic angle. Stable cardiomegaly, unchanged mediastinal contours. Median sternotomy. Chronic coarsened lung markings. IMPRESSION: 1. Left pneumothorax is diminished in size with only a tiny apical component persisting. 2. Stable cardiomegaly and left lung base scarring. Electronically Signed   By: Narda Rutherford M.D.   On: 09/12/2023 15:55   DG CHEST PORT 1 VIEW Result Date: 09/11/2023 CLINICAL DATA:  Pneumothorax EXAM: PORTABLE CHEST 1 VIEW COMPARISON:  09/10/2023 FINDINGS: Small left pneumothorax has decreased in size from the prior study, now estimated at 5% volume. No mediastinal shift. Left basilar atelectasis. Stable heart size status post sternotomy and CABG. No pleural effusion. No right pneumothorax. IMPRESSION: Small left pneumothorax has decreased in size from the prior study, now estimated at 5% volume. Electronically Signed   By: Duanne Guess D.O.   On: 09/11/2023 12:04   CT Angio Chest PE W and/or Wo Contrast Result Date: 09/11/2023 CLINICAL DATA:  Dyspnea, left chest pain, COPD, CHF EXAM: CT ANGIOGRAPHY CHEST WITH CONTRAST TECHNIQUE: Multidetector CT imaging of the chest was performed using the standard protocol during bolus administration of intravenous contrast. Multiplanar CT image reconstructions and MIPs were obtained to evaluate the vascular anatomy. RADIATION DOSE REDUCTION: This exam was performed  according to the departmental dose-optimization program which includes automated exposure control, adjustment of the mA and/or kV according to patient size and/or use of iterative reconstruction technique. CONTRAST:  75mL OMNIPAQUE IOHEXOL 350 MG/ML SOLN COMPARISON:  10/29/2021 FINDINGS: Cardiovascular: There is adequate opacification of the pulmonary arterial tree. No intraluminal filling defect identified to suggest acute pulmonary embolism. Central pulmonary arteries are mildly enlarged in keeping with changes of pulmonary arterial hypertension, stable since prior examination.  Coronary artery bypass grafting and mitral valve replacement have been performed. Mild cardiomegaly with biatrial enlargement is present. No pericardial effusion. Mild atherosclerotic calcification within the thoracic aorta. No aortic aneurysm. Mediastinum/Nodes: No enlarged mediastinal, hilar, or axillary lymph nodes. Thyroid gland, trachea, and esophagus demonstrate no significant findings. Lungs/Pleura: Small left pneumothorax is present. There is mild pneumomediastinum within the left anterolateral mediastinum. No significant mediastinal shift or hyperexpansion of the left hemithorax to suggest tension physiology. Lingular atelectasis. No confluent pulmonary infiltrate. No pleural effusion. Upper Abdomen: No acute abnormality. Musculoskeletal: Remote appearing T10 and T11 compression deformities are seen with mild loss of height. No acute bone abnormality. No lytic or blastic bone lesion. Review of the MIP images confirms the above findings. IMPRESSION: 1. No pulmonary embolism. 2. Small left pneumothorax. Mild pneumomediastinum. No significant mediastinal shift or hyperexpansion of the left hemithorax to suggest tension physiology. 3. Mild cardiomegaly with biatrial enlargement. Status post coronary artery bypass grafting and mitral valve replacement. Aortic Atherosclerosis (ICD10-I70.0). Electronically Signed   By: Helyn Numbers M.D.    On: 09/11/2023 00:16   DG Chest Port 1 View Result Date: 09/10/2023 CLINICAL DATA:  Chest pain, COVID. EXAM: PORTABLE CHEST 1 VIEW COMPARISON:  Chest x-ray 01/12/2022 FINDINGS: There is a small left pneumothorax measuring 6 mm from the lung apex. There are minimal patchy opacities in the left costophrenic angle. No pleural effusion. The cardiomediastinal silhouette is stable. Patient is status post cardiac surgery. The heart is mildly enlarged. No acute fractures are seen. Cervical spinal fusion plate is present. IMPRESSION: 1. Small left pneumothorax measuring 6 mm from the lung apex. 2. Minimal patchy opacities in the left costophrenic angle, likely atelectasis. These results were called by telephone at the time of interpretation on 09/10/2023 at 10:46 pm to provider HAYLEY NAASZ , who verbally acknowledged these results. Electronically Signed   By: Darliss Cheney M.D.   On: 09/10/2023 22:46    Assessment and Plan:   # Sinus arrest:  She had a 4-second pause on telemetry.  She also has a very long first-degree AV block at baseline.  She has had some bradycardia in the past that has stabilized with holding metoprolol.  She has been known to have up to 2.5-second pauses before.  This pause was asymptomatic.  Also occurring in the setting of COVID-19 and coughing, though she was not coughing excessively at the time and I do not think that this was vagally mediated.  Recommend that she wear a monitor for 14 days and then see EP again as an outpatient.  She was on metoprolol prior to admission but has not been on it in the hospital.  Continue to hold at discharge.  # PAF: Currently in sinus rhythm with first-degree AV block as above.  She has been on warfarin for anticoagulation for years.  She has a bioprosthetic valve and should be okay to transition to Eliquis.  She has had trouble maintaining INR is in range.  No nodal agents as above.  # AKI: Renal function is stable at baseline but her creatinine  increased in the setting of poor oral intake and acute illness.  Agree with holding metolazone and Bumex until her appetite improves.  She is okay to take a dose of Bumex as needed, but otherwise hold off on her diuretics until she is feeling better.  # HFpEF: Holding diuretics as above.  Continue Entresto.  Considering SGLT2 as an outpatient.   # Mild aortic stenosis:  Follow outpatient.   #  CAD:  # Hyperlipidemia: She has multiple medication intolerances.  Switching warfarin to Eliquis as above.  Working to get her started on Leqvio as an outpatient.   Risk Assessment/Risk Scores:        New York Heart Association (NYHA) Functional Class NYHA Class II  CHA2DS2-VASc Score = 6   This indicates a 9.7% annual risk of stroke. The patient's score is based upon: CHF History: 1 HTN History: 1 Diabetes History: 0 Stroke History: 0 Vascular Disease History: 1 Age Score: 2 Gender Score: 1     [facility] will sign off.   Medication Recommendations:  hold metoprolol switch warfarin to Eliquis.  Hold diuretics until oral intake normalizes.  Okay to take Bumex if she has lower extremity edema. Other recommendations (labs, testing, etc): 14 day Zio Follow up as an outpatient: WIll arrange  For questions or updates, please contact [facility] Please consult [email or web address] for contact info under    Signed, Chilton Si, MD  09/13/2023 10:03 AM

## 2023-09-13 NOTE — Progress Notes (Signed)
Pt/family given discharge instructions, medication lists, follow up appointments, and when to call the doctor.  Pt/family verbalizes understanding. Adria Costley McClintock, RN   

## 2023-09-13 NOTE — Discharge Summary (Signed)
[facility] Physician Discharge Summary   Patient name: Catherine [name]  Admit date:     09/10/2023  Discharge date: 09/13/2023  Attending Physician: Lorin Glass [[id number]]  Discharge Physician: Carollee Herter   PCP: Kristian Covey, MD  Admitted From: Home  Disposition:  Home  Recommendations for Outpatient Follow-up:  Follow up with PCP in 1-2 weeks Follow up with cardiology Please follow up on the following pending results:  pt going home with cardiac monitor to evaluate her sinus pauses  Home Health:No Equipment/Devices: Home nebulizer machine.  Discharge Condition:Stable CODE STATUS:FULL Diet recommendation: Heart Healthy Fluid Restriction: None  Hospital Summary: HPI: Catherine [name] is a 88 y.o. female with medical history significant for paroxysmal A-fib on Coumadin, severe mitral stenosis status post bioprosthetic mitral valve replacement, coronary artery disease status post single-vessel CABG, hypertension, hyperlipidemia with statin and Zetia intolerance, chronic HFpEF, chronic back pain with prior back surgeries, chronic insomnia, CKD 3A, who initially presented to [facility] ED with a constellation of complaints, subjective fevers, body aches, malaise, cough, chest pain and shortness of breath for the past 3-4 days.  Associated with pleuritic pain, worse with taking a deep breath.   In the ED, a chest x-ray showed small left pneumothorax measuring 6 mm from the lung apex.  The patient subsequently had a CT angio chest PE which was negative for pulmonary embolism, confirmed small left pneumothorax, mild pneumomediastinum with no significant mediastinal shift or hyperexpansion of the left hemithorax to suggest tension physiology.   EDP discussed the case with PCCM who recommended 100% FiO2 and repeat chest x-ray in 6 hours.  The patient COVID-19 screening test returned positive.   Significant Events: Admitted 09/10/2023 for left pneumothorax   Significant  Labs: Covid PCR POSITIVE WBC 7.6, HgB 12.7, Na 141, K 3.3, BUN 19, Scr 0.94  Significant Imaging Studies: Admission CTPA shows No pulmonary embolism. 2. Small left pneumothorax. Mild pneumomediastinum. No significant mediastinal shift or hyperexpansion of the left hemithorax to suggest tension physiology. 3. Mild cardiomegaly with biatrial enlargement. Status post coronary artery bypass grafting and mitral valve replacement.  Antibiotic Therapy: Anti-infectives (From admission, onward)    Start     Dose/Rate Route Frequency Ordered Stop   09/12/23 1000  remdesivir 100 mg in sodium chloride 0.9 % 100 mL IVPB       Placed in "Followed by" Linked Group   100 mg 200 mL/hr over 30 Minutes Intravenous Daily 09/11/23 0557 09/14/23 0959   09/11/23 0730  remdesivir 200 mg in sodium chloride 0.9% 250 mL IVPB       Placed in "Followed by" Linked Group   200 mg 580 mL/hr over 30 Minutes Intravenous Once 09/11/23 0557 09/11/23 1000       Procedures:   Consultants: [facility] Course by Problem: * Pneumothorax on left 09-12-2023 repeat CXR after 100% NRB for nitrogen washout shows resolution of PTX. No need for chest tube. Will repeat CXR in AM to ensure stability. Pt is nervous about going home. Told her that she should be discharged today but I'm keeping her overnight out of an abundance of caution.  09-13-2023 resolved. PCCM has signed off.  COVID-19 virus infection 09-12-2023 pt without hypoxia. Treated with IV remdesivir. Continue supportive care. Will arrange for home nebulizer machine. Prn duonebs. 09-13-2023 DC to home. Supportive care. Prn duonebs.  Hypokalemia 09-12-2023 replete with po kcl. Repeat CMP in AM.  09-13-2023 resolved.  CKD stage 3a, GFR 45-59 ml/min (HCC) 09-12-2023 Scr up  to 1.49 today. Known CKD stage 3a with baseline Scr 1-1.4.  holding Entresto now. No indications for diuresis despite what pt is stating. Pt does not have any flank edema. She says her abd  is "hard" but in actuality her abd is soft and non-tender. No edema is noted. Do NOT give IV lasix.   09-13-2023 Scr back down to 1.32. will ask pt to hold bumex/metolazone until her PO intake is back to normal after she recovers from covid.  Chronic diastolic CHF (congestive heart failure) (HCC) 09-12-2023 pt is euvolemic. Maybe even slightly hypovolemic. Definitely does not need diuresis today.  09-13-2023 continue to hold diuresis until po intake is back to normal.  S/P mitral valve replacement with bioprosthetic valve - Oct 14, 2016 09-12-2023 stable.  09-13-2023 discussed with cards. Pt had been on coumadin in the past due to valvular afib from her mitral stenosis. Now that she has had mitral valve replacement with bioprosthetic valve, cards has decided to change her to Eliquis. This will eliminate her need to have INR checked for anticoagulation.  Chronic anxiety 09-12-2023 chronic. Have placated her by keeping her in the hospital overnight to repeat CXR in AM. Plan for DC in AM.  09-13-2023 stable.  Atrial fibrillation (HCC) 09-12-2023 stable. 09-13-2023 discussed with cards. Pt had been on coumadin in the past due to valvular afib from her mitral stenosis. Now that she has had mitral valve replacement with bioprosthetic valve, cards has decided to change her to Eliquis. This will eliminate her need to have INR checked for anticoagulation.  On Coumadin for atrial fibrillation (HCC)-resolved as of 09/13/2023 09-12-2023 on coumadin. Pharmacy managing INR.  09-13-2023 discussed with cards. Pt had been on coumadin in the past due to valvular afib from her mitral stenosis. Now that she has had mitral valve replacement with bioprosthetic valve, cards has decided to change her to Eliquis. This will eliminate her need to have INR checked for anticoagulation.  Chronic anticoagulation 09-13-2023 discussed with cards. Pt had been on coumadin in the past due to valvular afib from her mitral  stenosis. Now that she has had mitral valve replacement with bioprosthetic valve, cards has decided to change her to Eliquis. This will eliminate her need to have INR checked for anticoagulation.  Sinus pause 09-13-2023 consulted cardiology about 4 sec sinus pauses. Pt not on any AV nodal blockers. Pt will get 14 day ziopatch vs live monitor and f/u with cardiology in clinic.    Discharge Diagnoses:  Principal Problem:   Pneumothorax on left Active Problems:   COVID-19 virus infection   CKD stage 3a, GFR 45-59 ml/min (HCC)   Hypokalemia   Atrial fibrillation (HCC)   Chronic anxiety   S/P mitral valve replacement with bioprosthetic valve - Oct 14, 2016   Chronic diastolic CHF (congestive heart failure) (HCC)   Sinus pause   Chronic anticoagulation   Discharge Instructions  Discharge Instructions     Call MD for:  difficulty breathing, headache or visual disturbances   Complete by: As directed    Call MD for:  extreme fatigue   Complete by: As directed    Call MD for:  persistant dizziness or light-headedness   Complete by: As directed    Call MD for:  persistant nausea and vomiting   Complete by: As directed    Call MD for:  severe uncontrolled pain   Complete by: As directed    Call MD for:  temperature >100.4   Complete by: As directed  Diet - low sodium heart healthy   Complete by: As directed    Discharge instructions   Complete by: As directed    1. Follow up with your primary care provider in 1-2 weeks following discharge 2. Hold on taking your water pills(bumex and metolazone) until your appetite and fluid intake is back to normal after you recover from your Covid infection   Increase activity slowly   Complete by: As directed       Allergies as of 09/13/2023       Reactions   Aldactone [spironolactone] Other (See Comments)   dyspnea   Amoxicillin Palpitations   Tachycardia Has patient had a PCN reaction causing immediate rash, facial/tongue/throat  swelling, SOB or lightheadedness with hypotension: no Has patient had a PCN reaction causing severe rash involving mucus membranes or skin necrosis: {no Has patient had a PCN reaction that required hospitalization {no Has patient had a PCN reaction occurring within the last 10 years: {yes If all of the above answers are "NO", then may proceed with Cephalosporin use.   Diltiazem Other (See Comments)   Causing headaches   Flagyl [metronidazole Hcl] Other (See Comments)   Causing headaches   Flovent [fluticasone Propionate] Other (See Comments)   Leg cramps   Gabapentin Swelling   Lyrica [pregabalin] Swelling   Quinidine Diarrhea, Other (See Comments)   Fever diarrhea   Simvastatin Other (See Comments)   Leg pain, myalgia   Tramadol Nausea Only   Verapamil Other (See Comments)   myalgias   Amlodipine    Low extremity edema   Crestor [rosuvastatin]    myalgia   Livalo [pitavastatin]    myalgias   Polymyxin B-trimethoprim Swelling   Zetia [ezetimibe]    LEG CRAMPS   Benazepril Hcl Cough   Ciprofloxacin Diarrhea   Codeine Nausea Only   Nitrofurantoin Monohyd Macro Nausea Only        Medication List     PAUSE taking these medications    bumetanide 2 MG tablet Wait to take this until your doctor or other care provider tells you to start again. Commonly known as: Bumex Please take 2mg  daily in the morning and then 2mg  in the afternoon on MONDAY, [facility] AND FRIDAY   metolazone 5 MG tablet Wait to take this until your doctor or other care provider tells you to start again. Commonly known as: ZAROXOLYN TAKE ONE TABLET AS NEEDED ONCE A WEEK 30 MINUTES BEFORE BUMEX DOSE. AS NEEDED ONLY FOR WEIGHT GAIN 2LBS OVERNIGHT OR 5 LBS IN ONE WEEK   potassium chloride 10 MEQ tablet Wait to take this until your doctor or other care provider tells you to start again. Commonly known as: KLOR-CON TAKE UP TO 8 TABLETS DAILY AS DIRECTED       STOP taking these medications     trimethoprim-polymyxin b ophthalmic solution Commonly known as: Polytrim   warfarin 2 MG tablet Commonly known as: COUMADIN       TAKE these medications    acetaminophen 500 MG tablet Commonly known as: TYLENOL Take 1,000 mg by mouth every 6 (six) hours as needed for mild pain.   albuterol 108 (90 Base) MCG/ACT inhaler Commonly known as: VENTOLIN HFA INHALE 2 PUFFS INTO THE LUNGS EVERY 4 HOURS AS NEEDED FOR AHEEZING OR SHORTNESS OF BREATH   apixaban 2.5 MG Tabs tablet Commonly known as: ELIQUIS Take 1 tablet (2.5 mg total) by mouth 2 (two) times daily. Start taking on: September 19, 2023   cholecalciferol 25 MCG (  1000 UNIT) tablet Commonly known as: VITAMIN D3 Take 1,000 Units by mouth daily.   diazepam 5 MG tablet Commonly known as: VALIUM Take 1 tablet (5 mg total) by mouth at bedtime.   Entresto 24-26 MG Generic drug: sacubitril-valsartan Take 1 tablet by mouth 2 (two) times daily.   estradiol 0.1 MG/GM vaginal cream Commonly known as: ESTRACE VAGINAL USE 2 grams per vagina daily for 2 weeks and then 1 gram per vagina three times per week. What changed: additional instructions   fluticasone 50 MCG/ACT nasal spray Commonly known as: FLONASE Place 1 spray into both nostrils daily as needed for allergies or rhinitis.   HAIR/SKIN/NAILS/BIOTIN PO Take 1 tablet by mouth daily.   Multivitamin Adult Extra C Chew Chew 2 tablets by mouth daily.   HYDROcodone-acetaminophen 5-325 MG tablet Commonly known as: NORCO/VICODIN TAKE ONE TABLET EVERY 8 HOURS AS NEEDED FOR PAIN What changed: See the new instructions.   hydrocortisone 2.5 % rectal cream Commonly known as: Anusol-HC Place 1 Application rectally 2 (two) times daily.   ipratropium-albuterol 0.5-2.5 (3) MG/3ML Soln Commonly known as: DUONEB Take 3 mLs by nebulization every 4 (four) hours as needed.   metoprolol succinate 25 MG 24 hr tablet Commonly known as: TOPROL-XL TAKE 1/2 TABLET DAILY    phenazopyridine 200 MG tablet Commonly known as: Pyridium Take 1 tablet (200 mg total) by mouth 3 (three) times daily as needed (urine pain).   terconazole 0.4 % vaginal cream Commonly known as: TERAZOL 7 Place 1 applicator vaginally at bedtime.               Durable Medical Equipment  (From admission, onward)           Start     Ordered   09/12/23 1320  For home use only DME Nebulizer machine  Once       Question Answer Comment  Patient needs a nebulizer to treat with the following condition COPD (chronic obstructive pulmonary disease) (HCC)   Length of Need Lifetime      09/12/23 1319            Follow-up Information     [facility] Follow up.   Specialty: Cardiology Why: You have been referred to our Electrophysiology team. The office will call you to schedule this appointment. Contact information: [address] [place] [address] [phone]               Allergies  Allergen Reactions   Aldactone [Spironolactone] Other (See Comments)    dyspnea   Amoxicillin Palpitations    Tachycardia Has patient had a PCN reaction causing immediate rash, facial/tongue/throat swelling, SOB or lightheadedness with hypotension: no Has patient had a PCN reaction causing severe rash involving mucus membranes or skin necrosis: {no Has patient had a PCN reaction that required hospitalization {no Has patient had a PCN reaction occurring within the last 10 years: {yes If all of the above answers are "NO", then may proceed with Cephalosporin use.   Diltiazem Other (See Comments)    Causing headaches    Flagyl [Metronidazole Hcl] Other (See Comments)    Causing headaches     Flovent [Fluticasone Propionate] Other (See Comments)    Leg cramps   Gabapentin Swelling   Lyrica [Pregabalin] Swelling   Quinidine Diarrhea and Other (See Comments)    Fever diarrhea   Simvastatin Other (See Comments)    Leg  pain, myalgia   Tramadol Nausea Only  Verapamil Other (See Comments)    myalgias   Amlodipine     Low extremity edema   Crestor [Rosuvastatin]     myalgia   Livalo [Pitavastatin]     myalgias   Polymyxin B-Trimethoprim Swelling   Zetia [Ezetimibe]     LEG CRAMPS    Benazepril Hcl Cough   Ciprofloxacin Diarrhea   Codeine Nausea Only   Nitrofurantoin Monohyd Macro Nausea Only    Discharge Exam: Vitals:   09/13/23 0730 09/13/23 0832  BP:  (!) 120/49  Pulse:  67  Resp:  20  Temp:  98.7 F (37.1 C)  SpO2: 95% 95%    Physical Exam Vitals and nursing note reviewed.  Constitutional:      General: She is not in acute distress.    Appearance: She is not toxic-appearing or diaphoretic.     Comments: Chronically ill appearing  HENT:     Head: Normocephalic and atraumatic.     Nose: Nose normal.  Cardiovascular:     Rate and Rhythm: Normal rate and regular rhythm.  Pulmonary:     Effort: Pulmonary effort is normal. No respiratory distress.     Breath sounds: Normal breath sounds. No stridor. No wheezing.  Abdominal:     General: Abdomen is flat. Bowel sounds are normal. There is no distension.     Palpations: Abdomen is soft.  Musculoskeletal:     Right lower leg: No edema.     Left lower leg: No edema.  Skin:    General: Skin is warm and dry.     Capillary Refill: Capillary refill takes less than 2 seconds.  Neurological:     General: No focal deficit present.     Mental Status: She is oriented to person, place, and time.  Psychiatric:        Mood and Affect: Mood is anxious.     The results of significant diagnostics from this hospitalization (including imaging, microbiology, ancillary and laboratory) are listed below for reference.    Microbiology: Recent Results (from the past 240 hours)  Resp panel by RT-PCR (RSV, Flu A&B, Covid) Anterior Nasal Swab     Status: Abnormal   Collection Time: 09/10/23  8:18 PM   Specimen: Anterior Nasal Swab  Result Value Ref  Range Status   SARS Coronavirus 2 by RT PCR POSITIVE (A) NEGATIVE Final    Comment: (NOTE) SARS-CoV-2 target nucleic acids are DETECTED.  The SARS-CoV-2 RNA is generally detectable in upper respiratory specimens during the acute phase of infection. Positive results are indicative of the presence of the identified virus, but do not rule out bacterial infection or co-infection with other pathogens not detected by the test. Clinical correlation with patient history and other diagnostic information is necessary to determine patient infection status. The expected result is Negative.  Fact Sheet for Patients: BloggerCourse.com  Fact Sheet for Healthcare Providers: SeriousBroker.it  This test is not yet approved or cleared by the [place] FDA and  has been authorized for detection and/or diagnosis of SARS-CoV-2 by FDA under an Emergency Use Authorization (EUA).  This EUA will remain in effect (meaning this test can be used) for the duration of  the COVID-19 declaration under Section 564(b)(1) of the A ct, 21 U.S.C. section 360bbb-3(b)(1), unless the authorization is terminated or revoked sooner.     Influenza A by PCR NEGATIVE NEGATIVE Final   Influenza B by PCR NEGATIVE NEGATIVE Final    Comment: (NOTE) The Xpert Xpress SARS-CoV-2/FLU/RSV plus assay is intended  as an aid in the diagnosis of influenza from Nasopharyngeal swab specimens and should not be used as a sole basis for treatment. Nasal washings and aspirates are unacceptable for Xpert Xpress SARS-CoV-2/FLU/RSV testing.  Fact Sheet for Patients: BloggerCourse.com  Fact Sheet for Healthcare Providers: SeriousBroker.it  This test is not yet approved or cleared by the [place] FDA and has been authorized for detection and/or diagnosis of SARS-CoV-2 by FDA under an Emergency Use Authorization (EUA). This EUA will  remain in effect (meaning this test can be used) for the duration of the COVID-19 declaration under Section 564(b)(1) of the Act, 21 U.S.C. section 360bbb-3(b)(1), unless the authorization is terminated or revoked.     Resp Syncytial Virus by PCR NEGATIVE NEGATIVE Final    Comment: (NOTE) Fact Sheet for Patients: BloggerCourse.com  Fact Sheet for Healthcare Providers: SeriousBroker.it  This test is not yet approved or cleared by the [place] FDA and has been authorized for detection and/or diagnosis of SARS-CoV-2 by FDA under an Emergency Use Authorization (EUA). This EUA will remain in effect (meaning this test can be used) for the duration of the COVID-19 declaration under Section 564(b)(1) of the Act, 21 U.S.C. section 360bbb-3(b)(1), unless the authorization is terminated or revoked.  Performed at [facility], [address] [place], [place] [address]   Blood culture (routine x 2)     Status: None (Preliminary result)   Collection Time: 09/10/23 11:07 PM   Specimen: BLOOD  Result Value Ref Range Status   Specimen Description   Final    BLOOD LEFT ANTECUBITAL Performed at [facility], [address] [place], [place] [address]    Special Requests   Final    BOTTLES DRAWN AEROBIC AND ANAEROBIC Blood Culture results may not be optimal due to an excessive volume of blood received in culture bottles Performed at [facility], [address] [place], [place] [address]    Culture   Final    NO GROWTH 2 DAYS Performed at [facility], [address], [place], [place] [address]    Report Status PENDING  Incomplete  Blood culture (routine x 2)     Status: None (Preliminary result)   Collection Time: 09/10/23 11:12 PM   Specimen: BLOOD RIGHT FOREARM  Result Value Ref Range Status   Specimen Description   Final    BLOOD RIGHT FOREARM Performed at [facility], [address] [place], [place] [address]    Special Requests   Final    BOTTLES DRAWN AEROBIC AND ANAEROBIC Blood Culture results may not be optimal due to an excessive volume of blood received in culture bottles Performed at [facility], [address], [place], [place] [address]    Culture   Final    NO GROWTH 2 DAYS Performed at [facility], [address] [place], [place] [address]    Report Status PENDING  Incomplete     Labs: BNP (last 3 results) Recent Labs    10/11/22 0957  BNP 181.8*   Basic Metabolic Panel: Recent Labs  Lab 09/10/23 2018 09/11/23 0922 09/12/23 0312 09/13/23 0323  NA 141 139 136 139  K 3.3* 3.3* 3.2* 3.5  CL 101 99 104 107  CO2 31 27 25 24   GLUCOSE 111* 163* 104* 96  BUN 19 19 31* 32*  CREATININE 0.94 1.02* 1.49* 1.32*  CALCIUM 9.5 9.3 8.5* 8.3*  MG  --  2.1  --  2.1  PHOS  --  4.0  --   --    Liver Function Tests: Recent Labs  Lab 09/13/23 0323  AST 22  ALT 11  ALKPHOS 53  BILITOT 0.4  PROT 5.1*  ALBUMIN 2.8*   CBC: Recent Labs  Lab 09/10/23 2018 09/11/23 0922 09/12/23 0312  WBC 7.6 5.9 6.4  NEUTROABS  --   --  4.6  HGB 12.7 13.9 11.3*  HCT 37.1 41.0 33.1*  MCV 89.4 88.6 88.3  PLT 164 161 170   Sepsis Labs Recent Labs  Lab 09/10/23 2018 09/11/23 0922 09/12/23 0312  WBC 7.6 5.9 6.4   Microbiology Recent Results (from the past 240 hours)  Resp panel by RT-PCR (RSV, Flu A&B, Covid) Anterior Nasal Swab     Status: Abnormal   Collection Time: 09/10/23  8:18 PM   Specimen: Anterior Nasal Swab  Result Value Ref Range Status   SARS Coronavirus 2 by RT PCR POSITIVE (A) NEGATIVE Final    Comment: (NOTE) SARS-CoV-2 target nucleic acids are DETECTED.  The SARS-CoV-2 RNA is generally detectable in upper respiratory specimens during the acute phase of infection. Positive results are indicative of the presence of the identified virus, but do not rule out bacterial infection or  co-infection with other pathogens not detected by the test. Clinical correlation with patient history and other diagnostic information is necessary to determine patient infection status. The expected result is Negative.  Fact Sheet for Patients: BloggerCourse.com  Fact Sheet for Healthcare Providers: SeriousBroker.it  This test is not yet approved or cleared by the [place] FDA and  has been authorized for detection and/or diagnosis of SARS-CoV-2 by FDA under an Emergency Use Authorization (EUA).  This EUA will remain in effect (meaning this test can be used) for the duration of  the COVID-19 declaration under Section 564(b)(1) of the A ct, 21 U.S.C. section 360bbb-3(b)(1), unless the authorization is terminated or revoked sooner.     Influenza A by PCR NEGATIVE NEGATIVE Final   Influenza B by PCR NEGATIVE NEGATIVE Final    Comment: (NOTE) The Xpert Xpress SARS-CoV-2/FLU/RSV plus assay is intended as an aid in the diagnosis of influenza from Nasopharyngeal swab specimens and should not be used as a sole basis for treatment. Nasal washings and aspirates are unacceptable for Xpert Xpress SARS-CoV-2/FLU/RSV testing.  Fact Sheet for Patients: BloggerCourse.com  Fact Sheet for Healthcare Providers: SeriousBroker.it  This test is not yet approved or cleared by the [place] FDA and has been authorized for detection and/or diagnosis of SARS-CoV-2 by FDA under an Emergency Use Authorization (EUA). This EUA will remain in effect (meaning this test can be used) for the duration of the COVID-19 declaration under Section 564(b)(1) of the Act, 21 U.S.C. section 360bbb-3(b)(1), unless the authorization is terminated or revoked.     Resp Syncytial Virus by PCR NEGATIVE NEGATIVE Final    Comment: (NOTE) Fact Sheet for Patients: BloggerCourse.com  Fact  Sheet for Healthcare Providers: SeriousBroker.it  This test is not yet approved or cleared by the [place] FDA and has been authorized for detection and/or diagnosis of SARS-CoV-2 by FDA under an Emergency Use Authorization (EUA). This EUA will remain in effect (meaning this test can be used) for the duration of the COVID-19 declaration under Section 564(b)(1) of the Act, 21 U.S.C. section 360bbb-3(b)(1), unless the authorization is terminated or revoked.  Performed at [facility], [address], [place], [place] [address]   Blood culture (routine x 2)  Status: None (Preliminary result)   Collection Time: 09/10/23 11:07 PM   Specimen: BLOOD  Result Value Ref Range Status   Specimen Description   Final    BLOOD LEFT ANTECUBITAL Performed at [facility], [address] [place], [place] [address]    Special Requests   Final    BOTTLES DRAWN AEROBIC AND ANAEROBIC Blood Culture results may not be optimal due to an excessive volume of blood received in culture bottles Performed at [facility], [address], [place], [place] [address]    Culture   Final    NO GROWTH 2 DAYS Performed at [facility], [address], [place], [place] [address]    Report Status PENDING  Incomplete  Blood culture (routine x 2)     Status: None (Preliminary result)   Collection Time: 09/10/23 11:12 PM   Specimen: BLOOD RIGHT FOREARM  Result Value Ref Range Status   Specimen Description   Final    BLOOD RIGHT FOREARM Performed at [facility], [address] [place], [place] [address]    Special Requests   Final    BOTTLES DRAWN AEROBIC AND ANAEROBIC Blood Culture results may not be optimal due to an excessive volume of blood received in culture bottles Performed at [facility], [address] [place], [place] [address]    Culture   Final    NO GROWTH 2 DAYS Performed  at [facility], [address], [place], [place] [address]    Report Status PENDING  Incomplete    Procedures/Studies: DG CHEST PORT 1 VIEW Result Date: 09/13/2023 CLINICAL DATA:  Short of breath, chest pain, fever EXAM: PORTABLE CHEST 1 VIEW COMPARISON:  09/12/2023 FINDINGS: Single frontal view of the chest demonstrates postsurgical changes from CABG. The cardiac silhouette is stable. Stable left apical pneumothorax volume estimated far less than 5%. Stable small left pleural effusion versus pleural thickening. Right chest is clear. No acute bony abnormalities. IMPRESSION: 1. Stable trace residual left apical pneumothorax. 2. Stable small left pleural effusion versus pleural thickening. Electronically Signed   By: Sharlet Salina M.D.   On: 09/13/2023 09:28   DG Chest Port 1 View Result Date: 09/12/2023 CLINICAL DATA:  Primary spontaneous pneumothorax. Chest pain and shortness of breath. EXAM: PORTABLE CHEST 1 VIEW COMPARISON:  Radiograph and CT yesterday FINDINGS: Left pneumothorax is diminished in size with only a tiny apical component persisting. Stable ill-defined basilar opacity and blunting of the costophrenic angle. Stable cardiomegaly, unchanged mediastinal contours. Median sternotomy. Chronic coarsened lung markings. IMPRESSION: 1. Left pneumothorax is diminished in size with only a tiny apical component persisting. 2. Stable cardiomegaly and left lung base scarring. Electronically Signed   By: Narda Rutherford M.D.   On: 09/12/2023 15:55   DG CHEST PORT 1 VIEW Result Date: 09/11/2023 CLINICAL DATA:  Pneumothorax EXAM: PORTABLE CHEST 1 VIEW COMPARISON:  09/10/2023 FINDINGS: Small left pneumothorax has decreased in size from the prior study, now estimated at 5% volume. No mediastinal shift. Left basilar atelectasis. Stable heart size status post sternotomy and CABG. No pleural effusion. No right pneumothorax. IMPRESSION: Small left pneumothorax has decreased in size from the prior  study, now estimated at 5% volume. Electronically Signed   By: Duanne Guess D.O.   On: 09/11/2023 12:04   CT Angio Chest PE W and/or Wo Contrast Result Date: 09/11/2023 CLINICAL DATA:  Dyspnea, left chest pain, COPD, CHF EXAM: CT ANGIOGRAPHY CHEST WITH CONTRAST TECHNIQUE: Multidetector CT imaging of  the chest was performed using the standard protocol during bolus administration of intravenous contrast. Multiplanar CT image reconstructions and MIPs were obtained to evaluate the vascular anatomy. RADIATION DOSE REDUCTION: This exam was performed according to the departmental dose-optimization program which includes automated exposure control, adjustment of the mA and/or kV according to patient size and/or use of iterative reconstruction technique. CONTRAST:  75mL OMNIPAQUE IOHEXOL 350 MG/ML SOLN COMPARISON:  10/29/2021 FINDINGS: Cardiovascular: There is adequate opacification of the pulmonary arterial tree. No intraluminal filling defect identified to suggest acute pulmonary embolism. Central pulmonary arteries are mildly enlarged in keeping with changes of pulmonary arterial hypertension, stable since prior examination. Coronary artery bypass grafting and mitral valve replacement have been performed. Mild cardiomegaly with biatrial enlargement is present. No pericardial effusion. Mild atherosclerotic calcification within the thoracic aorta. No aortic aneurysm. Mediastinum/Nodes: No enlarged mediastinal, hilar, or axillary lymph nodes. Thyroid gland, trachea, and esophagus demonstrate no significant findings. Lungs/Pleura: Small left pneumothorax is present. There is mild pneumomediastinum within the left anterolateral mediastinum. No significant mediastinal shift or hyperexpansion of the left hemithorax to suggest tension physiology. Lingular atelectasis. No confluent pulmonary infiltrate. No pleural effusion. Upper Abdomen: No acute abnormality. Musculoskeletal: Remote appearing T10 and T11 compression  deformities are seen with mild loss of height. No acute bone abnormality. No lytic or blastic bone lesion. Review of the MIP images confirms the above findings. IMPRESSION: 1. No pulmonary embolism. 2. Small left pneumothorax. Mild pneumomediastinum. No significant mediastinal shift or hyperexpansion of the left hemithorax to suggest tension physiology. 3. Mild cardiomegaly with biatrial enlargement. Status post coronary artery bypass grafting and mitral valve replacement. Aortic Atherosclerosis (ICD10-I70.0). Electronically Signed   By: Helyn Numbers M.D.   On: 09/11/2023 00:16   DG Chest Port 1 View Result Date: 09/10/2023 CLINICAL DATA:  Chest pain, COVID. EXAM: PORTABLE CHEST 1 VIEW COMPARISON:  Chest x-ray 01/12/2022 FINDINGS: There is a small left pneumothorax measuring 6 mm from the lung apex. There are minimal patchy opacities in the left costophrenic angle. No pleural effusion. The cardiomediastinal silhouette is stable. Patient is status post cardiac surgery. The heart is mildly enlarged. No acute fractures are seen. Cervical spinal fusion plate is present. IMPRESSION: 1. Small left pneumothorax measuring 6 mm from the lung apex. 2. Minimal patchy opacities in the left costophrenic angle, likely atelectasis. These results were called by telephone at the time of interpretation on 09/10/2023 at 10:46 pm to provider HAYLEY NAASZ , who verbally acknowledged these results. Electronically Signed   By: Darliss Cheney M.D.   On: 09/10/2023 22:46    Time coordinating discharge: 45 mins  SIGNED:  Carollee Herter, DO [facility] 09/13/23, 11:06 AM

## 2023-09-13 NOTE — Progress Notes (Signed)
PROGRESS NOTE    Catherine [name]  ZOX:[id number] DOB: 04/11/1935 DOA: 09/10/2023 PCP: Catherine Covey, MD  Subjective: Pt seen and examined. Pt has LOTS of questions. They are all related to her chronic disease states. I told her I would not be able to answer her questions as I am not familiar with all of her medical issues.  She PCP and other specialists that see her regularly would be able to provide comment on her "prognosis".  Pt weaned off NRB for her nitrogen wash-out for her PTX.   Hospital Course: HPI: Catherine [name] is a 88 y.o. female with medical history significant for paroxysmal A-fib on Coumadin, severe mitral stenosis status post bioprosthetic mitral valve replacement, coronary artery disease status post single-vessel CABG, hypertension, hyperlipidemia with statin and Zetia intolerance, chronic HFpEF, chronic back pain with prior back surgeries, chronic insomnia, CKD 3A, who initially presented to [facility] ED with a constellation of complaints, subjective fevers, body aches, malaise, cough, chest pain and shortness of breath for the past 3-4 days.  Associated with pleuritic pain, worse with taking a deep breath.   In the ED, a chest x-ray showed small left pneumothorax measuring 6 mm from the lung apex.  The patient subsequently had a CT angio chest PE which was negative for pulmonary embolism, confirmed small left pneumothorax, mild pneumomediastinum with no significant mediastinal shift or hyperexpansion of the left hemithorax to suggest tension physiology.   EDP discussed the case with PCCM who recommended 100% FiO2 and repeat chest x-ray in 6 hours.  The patient COVID-19 screening test returned positive.   Significant Events: Admitted 09/10/2023 for left pneumothorax   Significant Labs: Covid PCR POSITIVE WBC 7.6, HgB 12.7, Na 141, K 3.3, BUN 19, Scr 0.94  Significant Imaging Studies: Admission CTPA shows No pulmonary embolism. 2. Small left pneumothorax. Mild  pneumomediastinum. No significant mediastinal shift or hyperexpansion of the left hemithorax to suggest tension physiology. 3. Mild cardiomegaly with biatrial enlargement. Status post coronary artery bypass grafting and mitral valve replacement.  Antibiotic Therapy: Anti-infectives (From admission, onward)    Start     Dose/Rate Route Frequency Ordered Stop   09/12/23 1000  remdesivir 100 mg in sodium chloride 0.9 % 100 mL IVPB       Placed in "Followed by" Linked Group   100 mg 200 mL/hr over 30 Minutes Intravenous Daily 09/11/23 0557 09/14/23 0959   09/11/23 0730  remdesivir 200 mg in sodium chloride 0.9% 250 mL IVPB       Placed in "Followed by" Linked Group   200 mg 580 mL/hr over 30 Minutes Intravenous Once 09/11/23 0557 09/11/23 1000       Procedures:   Consultants: PCCM    Assessment and Plan: * Pneumothorax on left 09-12-2023 repeat CXR after 100% NRB for nitrogen washout shows resolution of PTX. No need for chest tube. Will repeat CXR in AM to ensure stability. Pt is nervous about going home. Told her that she should be discharged today but I'm keeping her overnight out of an abundance of caution.  09-13-2023 resolved. PCCM has signed off.  COVID-19 virus infection 09-12-2023 pt without hypoxia. Treated with IV remdesivir. Continue supportive care. Will arrange for home nebulizer machine. Prn duonebs. 09-13-2023 DC to home. Supportive care. Prn duonebs.  Hypokalemia 09-12-2023 replete with po kcl. Repeat CMP in AM.  09-13-2023 resolved.  CKD stage 3a, GFR 45-59 ml/min (HCC) 09-12-2023 Scr up to 1.49 today. Known CKD stage 3a with baseline Scr 1-1.4.  holding Entresto now. No indications for diuresis despite what pt is stating. Pt does not have any flank edema. She says her abd is "hard" but in actuality her abd is soft and non-tender. No edema is noted. Do NOT give IV lasix.   09-13-2023 Scr back down to 1.32. will ask pt to hold bumex/metolazone until her PO  intake is back to normal after she recovers from covid.  Chronic diastolic CHF (congestive heart failure) (HCC) 09-12-2023 pt is euvolemic. Maybe even slightly hypovolemic. Definitely does not need diuresis today.  09-13-2023 continue to hold diuresis until po intake is back to normal.  S/P mitral valve replacement with bioprosthetic valve - Oct 14, 2016 09-12-2023 stable.  09-13-2023 discussed with cards. Pt had been on coumadin in the past due to valvular afib from her mitral stenosis. Now that she has had mitral valve replacement with bioprosthetic valve, cards has decided to change her to Eliquis. This will eliminate her need to have INR checked for anticoagulation.  Chronic anxiety 09-12-2023 chronic. Have placated her by keeping her in the hospital overnight to repeat CXR in AM. Plan for DC in AM.  09-13-2023 stable.  Atrial fibrillation (HCC) 09-12-2023 stable. 09-13-2023 discussed with cards. Pt had been on coumadin in the past due to valvular afib from her mitral stenosis. Now that she has had mitral valve replacement with bioprosthetic valve, cards has decided to change her to Eliquis. This will eliminate her need to have INR checked for anticoagulation.  On Coumadin for atrial fibrillation (HCC)-resolved as of 09/13/2023 09-12-2023 on coumadin. Pharmacy managing INR.  09-13-2023 discussed with cards. Pt had been on coumadin in the past due to valvular afib from her mitral stenosis. Now that she has had mitral valve replacement with bioprosthetic valve, cards has decided to change her to Eliquis. This will eliminate her need to have INR checked for anticoagulation.  Chronic anticoagulation 09-13-2023 discussed with cards. Pt had been on coumadin in the past due to valvular afib from her mitral stenosis. Now that she has had mitral valve replacement with bioprosthetic valve, cards has decided to change her to Eliquis. This will eliminate her need to have INR checked for  anticoagulation.  Sinus pause 09-13-2023 consulted cardiology about 4 sec sinus pauses. Pt not on any AV nodal blockers. Pt will get 14 day ziopatch vs live monitor and f/u with cardiology in clinic.       DVT prophylaxis:   coumadin   Code Status: Full Code Family Communication: no family at bedside Disposition Plan: return home Reason for continuing need for hospitalization: medically stable for DC  Objective: Vitals:   09/13/23 0200 09/13/23 0706 09/13/23 0730 09/13/23 0832  BP: (!) 125/56   (!) 120/49  Pulse: (!) 55   67  Resp: (!) 25   20  Temp:    98.7 F (37.1 C)  TempSrc:    Oral  SpO2: 93%  95% 95%  Weight:  55.9 kg    Height:        Intake/Output Summary (Last 24 hours) at 09/13/2023 1109 Last data filed at 09/13/2023 0839 Gross per 24 hour  Intake 600 ml  Output 260 ml  Net 340 ml   Filed Weights   09/10/23 2015 09/12/23 0700 09/13/23 0706  Weight: 55.8 kg 55.8 kg 55.9 kg    Examination:  Physical Exam Vitals and nursing note reviewed.  Constitutional:      General: She is not in acute distress.    Appearance: She is  normal weight. She is not toxic-appearing.  HENT:     Head: Normocephalic and atraumatic.  Cardiovascular:     Rate and Rhythm: Normal rate and regular rhythm.  Pulmonary:     Effort: Pulmonary effort is normal.     Breath sounds: No wheezing.  Abdominal:     General: Abdomen is flat.  Musculoskeletal:     Right lower leg: No edema.     Left lower leg: No edema.  Skin:    General: Skin is warm and dry.     Capillary Refill: Capillary refill takes less than 2 seconds.  Neurological:     Mental Status: She is alert and oriented to person, place, and time.     Data Reviewed: I have personally reviewed following labs and imaging studies  CBC: Recent Labs  Lab 09/10/23 2018 09/11/23 0922 09/12/23 0312  WBC 7.6 5.9 6.4  NEUTROABS  --   --  4.6  HGB 12.7 13.9 11.3*  HCT 37.1 41.0 33.1*  MCV 89.4 88.6 88.3  PLT 164  161 170   Basic Metabolic Panel: Recent Labs  Lab 09/10/23 2018 09/11/23 0922 09/12/23 0312 09/13/23 0323  NA 141 139 136 139  K 3.3* 3.3* 3.2* 3.5  CL 101 99 104 107  CO2 31 27 25 24   GLUCOSE 111* 163* 104* 96  BUN 19 19 31* 32*  CREATININE 0.94 1.02* 1.49* 1.32*  CALCIUM 9.5 9.3 8.5* 8.3*  MG  --  2.1  --  2.1  PHOS  --  4.0  --   --    GFR: Estimated Creatinine Clearance: 25.3 mL/min (A) (by C-G formula based on SCr of 1.32 mg/dL (H)). Liver Function Tests: Recent Labs  Lab 09/13/23 0323  AST 22  ALT 11  ALKPHOS 53  BILITOT 0.4  PROT 5.1*  ALBUMIN 2.8*    Coagulation Profile: Recent Labs  Lab 09/07/23 0000 09/10/23 2325 09/12/23 0312 09/13/23 0323  INR 1.2* 1.5* 1.8* 2.1*   BNP (last 3 results) Recent Labs    10/11/22 0957  BNP 181.8*   Sepsis Labs: Recent Labs  Lab 09/10/23 2325 09/11/23 0336  LATICACIDVEN 0.8 0.9    Recent Results (from the past 240 hours)  Resp panel by RT-PCR (RSV, Flu A&B, Covid) Anterior Nasal Swab     Status: Abnormal   Collection Time: 09/10/23  8:18 PM   Specimen: Anterior Nasal Swab  Result Value Ref Range Status   SARS Coronavirus 2 by RT PCR POSITIVE (A) NEGATIVE Final    Comment: (NOTE) SARS-CoV-2 target nucleic acids are DETECTED.  The SARS-CoV-2 RNA is generally detectable in upper respiratory specimens during the acute phase of infection. Positive results are indicative of the presence of the identified virus, but do not rule out bacterial infection or co-infection with other pathogens not detected by the test. Clinical correlation with patient history and other diagnostic information is necessary to determine patient infection status. The expected result is Negative.  Fact Sheet for Patients: BloggerCourse.com  Fact Sheet for Healthcare Providers: SeriousBroker.it  This test is not yet approved or cleared by the [place] FDA and  has been  authorized for detection and/or diagnosis of SARS-CoV-2 by FDA under an Emergency Use Authorization (EUA).  This EUA will remain in effect (meaning this test can be used) for the duration of  the COVID-19 declaration under Section 564(b)(1) of the A ct, 21 U.S.C. section 360bbb-3(b)(1), unless the authorization is terminated or revoked sooner.     Influenza  A by PCR NEGATIVE NEGATIVE Final   Influenza B by PCR NEGATIVE NEGATIVE Final    Comment: (NOTE) The Xpert Xpress SARS-CoV-2/FLU/RSV plus assay is intended as an aid in the diagnosis of influenza from Nasopharyngeal swab specimens and should not be used as a sole basis for treatment. Nasal washings and aspirates are unacceptable for Xpert Xpress SARS-CoV-2/FLU/RSV testing.  Fact Sheet for Patients: BloggerCourse.com  Fact Sheet for Healthcare Providers: SeriousBroker.it  This test is not yet approved or cleared by the [place] FDA and has been authorized for detection and/or diagnosis of SARS-CoV-2 by FDA under an Emergency Use Authorization (EUA). This EUA will remain in effect (meaning this test can be used) for the duration of the COVID-19 declaration under Section 564(b)(1) of the Act, 21 U.S.C. section 360bbb-3(b)(1), unless the authorization is terminated or revoked.     Resp Syncytial Virus by PCR NEGATIVE NEGATIVE Final    Comment: (NOTE) Fact Sheet for Patients: BloggerCourse.com  Fact Sheet for Healthcare Providers: SeriousBroker.it  This test is not yet approved or cleared by the [place] FDA and has been authorized for detection and/or diagnosis of SARS-CoV-2 by FDA under an Emergency Use Authorization (EUA). This EUA will remain in effect (meaning this test can be used) for the duration of the COVID-19 declaration under Section 564(b)(1) of the Act, 21 U.S.C. section 360bbb-3(b)(1), unless the  authorization is terminated or revoked.  Performed at [facility], [address], [place], [place] [address]   Blood culture (routine x 2)     Status: None (Preliminary result)   Collection Time: 09/10/23 11:07 PM   Specimen: BLOOD  Result Value Ref Range Status   Specimen Description   Final    BLOOD LEFT ANTECUBITAL Performed at [facility], [address], [place], [place] [address]    Special Requests   Final    BOTTLES DRAWN AEROBIC AND ANAEROBIC Blood Culture results may not be optimal due to an excessive volume of blood received in culture bottles Performed at [facility], [address], [place], [place] [address]    Culture   Final    NO GROWTH 2 DAYS Performed at [facility], [address] [place], [place] [address]    Report Status PENDING  Incomplete  Blood culture (routine x 2)     Status: None (Preliminary result)   Collection Time: 09/10/23 11:12 PM   Specimen: BLOOD RIGHT FOREARM  Result Value Ref Range Status   Specimen Description   Final    BLOOD RIGHT FOREARM Performed at [facility], [address], [place], [place] [address]    Special Requests   Final    BOTTLES DRAWN AEROBIC AND ANAEROBIC Blood Culture results may not be optimal due to an excessive volume of blood received in culture bottles Performed at [facility], [address] [place], [place] [address]    Culture   Final    NO GROWTH 2 DAYS Performed at [facility], [address], [place], [place] [address]    Report Status PENDING  Incomplete     Radiology Studies: DG CHEST PORT 1 VIEW Result Date: 09/13/2023 CLINICAL DATA:  Short of breath, chest pain, fever EXAM: PORTABLE CHEST 1 VIEW COMPARISON:  09/12/2023 FINDINGS: Single frontal view of the chest demonstrates postsurgical changes from CABG. The cardiac silhouette is stable. Stable left apical pneumothorax volume estimated far  less than 5%. Stable small left pleural effusion versus pleural thickening. Right  chest is clear. No acute bony abnormalities. IMPRESSION: 1. Stable trace residual left apical pneumothorax. 2. Stable small left pleural effusion versus pleural thickening. Electronically Signed   By: Sharlet Salina M.D.   On: 09/13/2023 09:28   DG Chest Port 1 View Result Date: 09/12/2023 CLINICAL DATA:  Primary spontaneous pneumothorax. Chest pain and shortness of breath. EXAM: PORTABLE CHEST 1 VIEW COMPARISON:  Radiograph and CT yesterday FINDINGS: Left pneumothorax is diminished in size with only a tiny apical component persisting. Stable ill-defined basilar opacity and blunting of the costophrenic angle. Stable cardiomegaly, unchanged mediastinal contours. Median sternotomy. Chronic coarsened lung markings. IMPRESSION: 1. Left pneumothorax is diminished in size with only a tiny apical component persisting. 2. Stable cardiomegaly and left lung base scarring. Electronically Signed   By: Narda Rutherford M.D.   On: 09/12/2023 15:55    Scheduled Meds:  diazepam  5 mg Oral QHS   ipratropium-albuterol  3 mL Nebulization BID   potassium chloride  40 mEq Oral Q4H   Warfarin - Pharmacist Dosing Inpatient   Does not apply q1600   Continuous Infusions:   LOS: 2 days   Time spent: 40 minutes  Carollee Herter, DO  [facility]  09/13/2023, 11:09 AM

## 2023-09-13 NOTE — Telephone Encounter (Signed)
Patient Product/process development scientist completed.    The patient is insured through [facility]. Patient has Medicare and is not eligible for a copay card, but may be able to apply for patient assistance, if available.    Ran test claim for Eliquis 2.5 mg and the current 30 day co-pay is $47.00.   This test claim was processed through [facility]- copay amounts may vary at other pharmacies due to pharmacy/plan contracts, or as the patient moves through the different stages of their insurance plan.     Roland Earl, CPHT Pharmacy Technician III Certified Patient Advocate [facility] Patient Advocate Team Direct Number: [phone]  Fax: [phone]

## 2023-09-14 ENCOUNTER — Ambulatory Visit: Payer: Medicare Other

## 2023-09-14 ENCOUNTER — Other Ambulatory Visit (HOSPITAL_BASED_OUTPATIENT_CLINIC_OR_DEPARTMENT_OTHER): Payer: Self-pay | Admitting: Cardiovascular Disease

## 2023-09-14 ENCOUNTER — Telehealth: Payer: Self-pay

## 2023-09-14 ENCOUNTER — Ambulatory Visit: Payer: Self-pay

## 2023-09-14 ENCOUNTER — Telehealth: Payer: Self-pay | Admitting: *Deleted

## 2023-09-14 DIAGNOSIS — I455 Other specified heart block: Secondary | ICD-10-CM | POA: Diagnosis not present

## 2023-09-14 NOTE — Telephone Encounter (Signed)
Copied from CRM [id number]. Topic: Clinical - Medication Question >> Sep 14, 2023 11:32 AM Maxwell Marion wrote: Reason for CRM: Scarlette Calico RN wants to let Dr. Lucie Leather nurse know the patient was discharged from hospital on 12/17 and would like something called in for her cough Catherine [name]) to the [facility]

## 2023-09-14 NOTE — Telephone Encounter (Signed)
Pt was transitioned to Eliquis when d/c from hospital stay.   Contacted pt to advise if anything is needed to not hesitate to contact the coumadin clinic. Pt reports she is now home but feeling only a little better. She is on isolation now at home because of the positive COVID diagnosis.  Advised pt she is on Eliquis now and she will not have to take warfarin as long as she is on Eliquis and she will not need to check INR while taking Eliquis.   Pt does have hospital f/u with PCP next week.   Pt appreciative of the call and verbalized understand.

## 2023-09-14 NOTE — Transitions of Care (Post Inpatient/ED Visit) (Signed)
09/14/2023  Name: Catherine [name] MRN: [id number] DOB: 09/10/1935  Today's TOC FU Call Status: Today's TOC FU Call Status:: Successful TOC FU Call Completed TOC FU Call Complete Date: 09/14/23 Patient's Name and Date of Birth confirmed.  Transition Care Management Follow-up Telephone Call Date of Discharge: 09/13/23 Discharge Facility: [facility]) Type of Discharge: Inpatient Admission Primary Inpatient Discharge Diagnosis:: Pneumothorax on left How have you been since you were released from the hospital?: Better (I still feel bad but some better) Any questions or concerns?: Yes Patient Questions/Concerns:: Patient needed help to go over meds and get organized Patient Questions/Concerns Addressed: Other: (RN went through all meddications and explained which medications to pause and which ones to stop)  Items Reviewed: Did you receive and understand the discharge instructions provided?: No Medications obtained,verified, and reconciled?: Yes (Medications Reviewed) Any new allergies since your discharge?: No Dietary orders reviewed?: Yes Type of Diet Ordered:: low sodium heart healthy Do you have support at home?: Yes People in Home: spouse Name of Support/Comfort Primary Source: Will  Medications Reviewed Today: Medications Reviewed Today     Reviewed by Ian Malkin Dennard Schaumann, RN (Case Manager) on 09/14/23 at 1101  Med List Status: <None>   Medication Order Taking? Sig Documenting Provider Last Dose Status Informant  acetaminophen (TYLENOL) 500 MG tablet [id number] Yes Take 1,000 mg by mouth every 6 (six) hours as needed for mild pain. [provider] Taking Active Self, Pharmacy Records  albuterol (VENTOLIN HFA) 108 (90 Base) MCG/ACT inhaler [id number] Yes INHALE 2 PUFFS INTO THE LUNGS EVERY 4 HOURS AS NEEDED FOR AHEEZING OR SHORTNESS OF Benny Lennert, MD Taking Active Self, Pharmacy Records  apixaban (ELIQUIS) 2.5 MG TABS tablet [id number] Yes Take 1 tablet (2.5  mg total) by mouth 2 (two) times daily. Carollee Herter, DO Taking Active   bumetanide (BUMEX) 2 MG tablet [id number]  Please take 2mg  daily in the morning and then 2mg  in the afternoon on Echo, [facility] AND FRIDAY Alver Sorrow, NP  Active Self, Pharmacy Records  cholecalciferol (VITAMIN D3) 25 MCG (1000 UNIT) tablet [id number] Yes Take 1,000 Units by mouth daily. [provider] Taking Active Self, Pharmacy Records  diazepam (VALIUM) 5 MG tablet [id number] Yes Take 1 tablet (5 mg total) by mouth at bedtime. Kristian Covey, MD Taking Active Self, Pharmacy Records  estradiol [facility] VAGINAL) 0.1 MG/GM vaginal cream [id number] Yes USE 2 grams per vagina daily for 2 weeks and then 1 gram per vagina three times per week.  Patient taking differently: USE 1 gram per vagina three times per week PRN   Kristian Covey, MD Taking Active Self, Pharmacy Records  fluticasone [facility]) 50 MCG/ACT nasal spray [id number] Yes Place 1 spray into both nostrils daily as needed for allergies or rhinitis. Coralyn Helling, MD Taking Active Self, Pharmacy Records  HYDROcodone-acetaminophen (NORCO/VICODIN) 5-325 MG tablet [id number] Yes TAKE ONE TABLET EVERY 8 HOURS AS NEEDED FOR PAIN  Patient taking differently: Take 1 tablet by mouth every 8 (eight) hours as needed for moderate pain (pain score 4-6). TAKE ONE TABLET EVERY 8 HOURS AS NEEDED FOR PAIN   Burchette, Elberta Fortis, MD Taking Active Self, Pharmacy Records  hydrocortisone (ANUSOL-HC) 2.5 % rectal cream [id number] Yes Place 1 Application rectally 2 (two) times daily. Philip Aspen, Limmie Patricia, MD Taking Active Self, Pharmacy Records  ipratropium-albuterol (DUONEB) 0.5-2.5 (3) MG/3ML Criss Rosales [id number] Yes Take 3 mLs by nebulization every 4 (four) hours as needed. Carollee Herter, DO Taking Active   metolazone (  ZAROXOLYN) 5 MG tablet [id number]  TAKE ONE TABLET AS NEEDED ONCE A WEEK 30 MINUTES BEFORE BUMEX DOSE. AS NEEDED ONLY FOR WEIGHT GAIN 2LBS OVERNIGHT OR 5 LBS IN ONE  WEEK Alver Sorrow, NP  Active Self, Pharmacy Records  Multiple Vitamins-Minerals (HAIR/SKIN/NAILS/BIOTIN PO) [id number] Yes Take 1 tablet by mouth daily. [provider] Taking Active Self, Pharmacy Records  Multiple Vitamins-Minerals (MULTIVITAMIN ADULT Roma Kayser) CHEW [id number] Yes Chew 2 tablets by mouth daily. [provider] Taking Active Self, Pharmacy Records  phenazopyridine (PYRIDIUM) 200 MG tablet [id number] Yes Take 1 tablet (200 mg total) by mouth 3 (three) times daily as needed (urine pain). Nelwyn Salisbury, MD Taking Active Self, Pharmacy Records           Med Note Gabriel Rung   Mon Aug 29, 2023 11:08 AM) Only uses when having uti's  potassium chloride (KLOR-CON) 10 MEQ tablet [id number]  TAKE UP TO 8 TABLETS DAILY AS DIRECTED  Patient taking differently: Take 80 mEq by mouth daily.   Chilton Si, MD  Active Self, Pharmacy Records  sacubitril-valsartan [facility]) 24-26 [place] [id number] Yes Take 1 tablet by mouth 2 (two) times daily. Chilton Si, MD Taking Active Self, Pharmacy Records  terconazole (TERAZOL 7) 0.4 % vaginal cream [id number] Yes Place 1 applicator vaginally at bedtime. [place], Betty G, MD Taking Active Self, Pharmacy Records            Home Care and Equipment/Supplies: Were Home Health Services Ordered?: NA Any new equipment or medical supplies ordered?: Yes Name of Medical supply agency?: [employer] Were you able to get the equipment/medical supplies?: Yes Do you have any questions related to the use of the equipment/supplies?: No  Functional Questionnaire: Do you need assistance with bathing/showering or dressing?: No Do you need assistance with meal preparation?: No Do you need assistance with eating?: No Do you have difficulty maintaining continence: No Do you need assistance with getting out of bed/getting out of a chair/moving?: No Do you have difficulty managing or taking your medications?: No  Follow up  appointments reviewed: PCP Follow-up appointment confirmed?: Yes Date of PCP follow-up appointment?: 09/20/23 Follow-up Provider: Dr Smitty Cords [facility] Follow-up appointment confirmed?: Yes Date of Specialist follow-up appointment?: 10/09/22 Follow-Up Specialty Provider:: Dr Steffanie Dunn Do you need transportation to your follow-up appointment?: No Do you understand care options if your condition(s) worsen?: Yes-patient verbalized understanding  SDOH Interventions Today    Flowsheet Row Most Recent Value  SDOH Interventions   Food Insecurity Interventions Intervention Not Indicated  Housing Interventions Intervention Not Indicated  Transportation Interventions Intervention Not Indicated, Patient Resources (Friends/Family)  Utilities Interventions Intervention Not Indicated       Goals Addressed             This Visit's Progress    30 DayTransition of Care Program       Current Barriers:  Knowledge Deficits related to plan of care for management of Pneumothorax and COVID   RNCM Clinical Goal(s):  Patient will work with the Care Management team over the next 30 days to address Transition of Care Barriers: Medication Management take all medications exactly as prescribed and will call provider for medication related questions as evidenced by EMR attend all scheduled medical appointments: PCP and Specialists as evidenced by EMR  through collaboration with RN Care manager, provider, and care team.   Interventions: Evaluation of current treatment plan related to  self management and patient's adherence to plan as established by provider  Transitions of Care:  New goal. Doctor Visits  - discussed the importance of doctor visits Contacted provider for patient needs Patient requesting cough medication  Patient Goals/Self-Care Activities: Participate in Transition of Care Program/Attend [facility] scheduled calls Notify RN Care Manager of TOC call rescheduling  needs Take all medications as prescribed Attend all scheduled provider appointments Call pharmacy for medication refills 3-7 days in advance of running out of medications Call provider office for new concerns or questions   Follow Up Plan:  Telephone follow up appointment with care management team member scheduled for:  Antionette Fairy [phone] 10:30 Call PCP for medication for cough         Interventions Today    Flowsheet Row Most Recent Value  Chronic Disease   Chronic disease during today's visit Other  [Pnuemothorax/ COVID]  General Interventions   General Interventions Discussed/Reviewed General Interventions Discussed, General Interventions Reviewed, Communication with  Communication with RN  Doristine Section [place] Care Coordinator]  Nutrition Interventions   Nutrition Discussed/Reviewed Nutrition Discussed, Nutrition Reviewed  [RN discussed with patient how to initate [facility] Meal Plan]  Pharmacy Interventions   Pharmacy Dicussed/Reviewed Pharmacy Topics Discussed, Pharmacy Topics Reviewed     Patient has consented to [facility] Coordinator to do weekly follow up outreach calls. Patient will call customer service to initiate [facility] Meal Plan Care Guide scheduled PCP appt RN called  and spoke with [facility] and left message for cough medication to be called in.  Gean Maidens BSN RN Population Health- Transition of Care Team.  [facility] [phone]

## 2023-09-14 NOTE — Progress Notes (Signed)
Pt was transitioned to Eliquis when d/c from hospital. Resolved anticoagulation encounters.

## 2023-09-15 ENCOUNTER — Telehealth: Payer: Self-pay | Admitting: Cardiovascular Disease

## 2023-09-15 ENCOUNTER — Encounter (HOSPITAL_BASED_OUTPATIENT_CLINIC_OR_DEPARTMENT_OTHER): Payer: Self-pay

## 2023-09-15 MED ORDER — BENZONATATE 100 MG PO CAPS: ORAL_CAPSULE | ORAL | 0 refills | Status: DC

## 2023-09-15 NOTE — Telephone Encounter (Signed)
Cardiology consulted during recent admission for 4 second pause on telemetry. Metoprolol was stopped.   ZIO not available under media. The only rhythm strip from today is: ZIOAT 09/15/23 at 5:%9 Am SB 48bpm with junctional escape beats with longest pause by my review 2.2-2.4 seconds. Prior triggered event 09/14/23 NSR 99bpm. Otherwise generally SB and PAC's.   As asymptomatic, no further change at this time. Will ask nursing team to ensure she is no longer taking Metoprolol. Has EP follow up 10/10/23 with Dr. Lalla Brothers which she should keep as scheduled.   Alver Sorrow, NP

## 2023-09-15 NOTE — Addendum Note (Signed)
Addended by: Christy Sartorius on: 09/15/2023 08:19 AM   Modules accepted: Orders

## 2023-09-15 NOTE — Telephone Encounter (Signed)
Zio by Meredeth Ide is calling to give critical results.

## 2023-09-15 NOTE — Progress Notes (Signed)
 ZIO AT applied at hospital. Dr. Duke Salvia to read.

## 2023-09-15 NOTE — Telephone Encounter (Signed)
Received call from Jocelyn from [employer] to report a critical finding on pt's heart monitor.   4.5 second pause on 12/19 @ 7:26 am. Pt reports feeling ok - woke up at that time. Asymptomatic.  Will route to MD/APP.

## 2023-09-15 NOTE — Telephone Encounter (Signed)
Rx sent 

## 2023-09-15 NOTE — Progress Notes (Signed)
Duplicate. See phone encounter 09/15/23.   Alver Sorrow, NP

## 2023-09-15 NOTE — Addendum Note (Signed)
Addended by: Christy Sartorius on: 09/15/2023 08:36 AM   Modules accepted: Orders

## 2023-09-15 NOTE — Telephone Encounter (Signed)
Called and spoke to patient, discussed critical pause on heart monitor. When asked how she felt, she said, "I feel fine." Pt has not taken her metoprolol.

## 2023-09-15 NOTE — Progress Notes (Signed)
Entered in error - please disregard

## 2023-09-16 ENCOUNTER — Telehealth: Payer: Self-pay | Admitting: Cardiology

## 2023-09-16 ENCOUNTER — Telehealth (HOSPITAL_BASED_OUTPATIENT_CLINIC_OR_DEPARTMENT_OTHER): Payer: Self-pay | Admitting: Cardiovascular Disease

## 2023-09-16 LAB — CULTURE, BLOOD (ROUTINE X 2)
Culture: NO GROWTH
Culture: NO GROWTH

## 2023-09-16 NOTE — Telephone Encounter (Signed)
Caller [employer]) is reporting abnormal results.

## 2023-09-16 NOTE — Telephone Encounter (Signed)
See updated phone note from today. Asymptomatic at time of event. follow up with EP as scheduled.   Alver Sorrow, NP

## 2023-09-16 NOTE — Telephone Encounter (Signed)
   Cardiac Monitor Alert  Date of alert:  09/16/2023   Patient Name: Catherine [name]  DOB: Dec 17, 1934  MRN: [id number]   [facility] Cardiologist: Chilton Si, MD  Medical [facility] EP:  Dr Lalla Brothers (consult 10/10/2023)  Monitor Information: Long Term Monitor-Live Telemetry [ZioAT]  Reason:  sinus pause  Ordering provider:  Shubert    Alert Pause(s) - Longest:  4.4 seconds This is the 3rd alert for this rhythm.   Next Cardiology Appointment}  Date:  10/10/2023  Provider:  DR Lalla Brothers  The patient was contacted today.  She is asymptomatic. Arrhythmia, symptoms and history reviewed with Ronn Melena NP.   Plan: No changes, keep follow up with Dr Lalla Brothers as planned   Regis Bill, LPN  08/65/7846 10:28 AM

## 2023-09-16 NOTE — Telephone Encounter (Signed)
Discussed with Dr Duke Salvia and ok to change parameters of contacting office to 5 or greater pause Faxed [employer]

## 2023-09-16 NOTE — Telephone Encounter (Signed)
On-Call cardiology  Called by [employer] regarding 4 second pause noted on ziopatch that occurred around 11pm on 09-15-23.  Pt was contacted and was asleep at the time of the event/asymptomatic. H/o pauses noted during recent hospitalization and prior pauses of similar length noted during the current monitoring period. Will notify ordering provider(s).  Precious Reel, MD , [facility] 09/16/23 1:52 AM

## 2023-09-18 ENCOUNTER — Telehealth: Payer: Self-pay | Admitting: Cardiology

## 2023-09-18 NOTE — Telephone Encounter (Signed)
   [employer] called this afternoon to notify us of a 4.6 second and 4.1 second pause occurring at 12:57 and 12:58 respectively this afternoon. Rhythm before and after pause reported as sinus/junctional by [employer]. I called and spoke with patient who denied any awareness of pauses. She has not felt dizzy/lightheaded/fatigued today. She did report noticing her heart rate increase while unloading the dishwasher this afternoon/repeatedly bending over but says she then rested and rates returned to normal. Continue to monitor. Patient aware that if she develops symptoms to call [facility] and with severe symptoms call 911.   Perlie Gold, PA-C

## 2023-09-19 ENCOUNTER — Telehealth: Payer: Self-pay | Admitting: Physician Assistant

## 2023-09-19 NOTE — Telephone Encounter (Signed)
Call received from [employer]:  Back to back pauses of 3.3 sec and 4.5 sec in total 7.8 sec at 3:32 pm EST with one conducted beat between pauses.   4.0 sec followed by a 4.1 sec for a total of 8.1 sec pause at 3:32 pm EST.   Pt reported heart fluttering, no syncope or fall. She was not sleeping at the time of pauses.   Currently in "sinus and junctional" per [employer].   NO AV nodal agents.   I spoke with the patient and scheduled her an appt with Dr. Ladona Ridgel tomorrow morning at [facility] to review her telemetry.   Triage - can you please reach out to the patient early tomorrow morning with direction to the office? I advised her to not drive.   Roe Rutherford Ceanna Wareing, PA-C 09/19/2023, 5:48 PM [phone]

## 2023-09-20 ENCOUNTER — Other Ambulatory Visit (HOSPITAL_COMMUNITY)
Admission: RE | Admit: 2023-09-20 | Discharge: 2023-09-20 | Disposition: A | Discharge status: Home / Self Care | Payer: Medicare Other | Source: Ambulatory Visit | Attending: Internal Medicine | Admitting: Internal Medicine

## 2023-09-20 ENCOUNTER — Ambulatory Visit: Payer: Medicare Other | Admitting: Family Medicine

## 2023-09-20 ENCOUNTER — Encounter: Payer: Self-pay | Admitting: Family Medicine

## 2023-09-20 ENCOUNTER — Ambulatory Visit: Payer: Medicare Other | Admitting: Internal Medicine

## 2023-09-20 ENCOUNTER — Encounter: Payer: Self-pay | Admitting: Internal Medicine

## 2023-09-20 ENCOUNTER — Ambulatory Visit: Payer: Medicare Other | Attending: Internal Medicine | Admitting: Internal Medicine

## 2023-09-20 VITALS — BP 142/64 | HR 56 | Ht 63.0 in | Wt 128.8 lb

## 2023-09-20 VITALS — BP 160/66 | HR 65 | Temp 97.8°F | Wt 128.8 lb

## 2023-09-20 DIAGNOSIS — I083 Combined rheumatic disorders of mitral, aortic and tricuspid valves: Secondary | ICD-10-CM | POA: Diagnosis not present

## 2023-09-20 DIAGNOSIS — E785 Hyperlipidemia, unspecified: Secondary | ICD-10-CM | POA: Diagnosis not present

## 2023-09-20 DIAGNOSIS — N1831 Chronic kidney disease, stage 3a: Secondary | ICD-10-CM

## 2023-09-20 DIAGNOSIS — J939 Pneumothorax, unspecified: Secondary | ICD-10-CM | POA: Diagnosis not present

## 2023-09-20 DIAGNOSIS — Z953 Presence of xenogenic heart valve: Secondary | ICD-10-CM

## 2023-09-20 DIAGNOSIS — I440 Atrioventricular block, first degree: Secondary | ICD-10-CM | POA: Diagnosis not present

## 2023-09-20 DIAGNOSIS — Z87891 Personal history of nicotine dependence: Secondary | ICD-10-CM | POA: Diagnosis not present

## 2023-09-20 DIAGNOSIS — I495 Sick sinus syndrome: Secondary | ICD-10-CM | POA: Insufficient documentation

## 2023-09-20 DIAGNOSIS — Z7901 Long term (current) use of anticoagulants: Secondary | ICD-10-CM

## 2023-09-20 DIAGNOSIS — I480 Paroxysmal atrial fibrillation: Secondary | ICD-10-CM | POA: Diagnosis not present

## 2023-09-20 DIAGNOSIS — J4489 Other specified chronic obstructive pulmonary disease: Secondary | ICD-10-CM | POA: Diagnosis not present

## 2023-09-20 DIAGNOSIS — N189 Chronic kidney disease, unspecified: Secondary | ICD-10-CM | POA: Diagnosis not present

## 2023-09-20 DIAGNOSIS — Z881 Allergy status to other antibiotic agents status: Secondary | ICD-10-CM | POA: Diagnosis not present

## 2023-09-20 DIAGNOSIS — Z885 Allergy status to narcotic agent status: Secondary | ICD-10-CM | POA: Diagnosis not present

## 2023-09-20 DIAGNOSIS — I5032 Chronic diastolic (congestive) heart failure: Secondary | ICD-10-CM

## 2023-09-20 DIAGNOSIS — Z806 Family history of leukemia: Secondary | ICD-10-CM | POA: Diagnosis not present

## 2023-09-20 DIAGNOSIS — I2510 Atherosclerotic heart disease of native coronary artery without angina pectoris: Secondary | ICD-10-CM | POA: Diagnosis not present

## 2023-09-20 DIAGNOSIS — Z8616 Personal history of COVID-19: Secondary | ICD-10-CM | POA: Diagnosis not present

## 2023-09-20 DIAGNOSIS — G479 Sleep disorder, unspecified: Secondary | ICD-10-CM | POA: Diagnosis not present

## 2023-09-20 DIAGNOSIS — I130 Hypertensive heart and chronic kidney disease with heart failure and stage 1 through stage 4 chronic kidney disease, or unspecified chronic kidney disease: Secondary | ICD-10-CM | POA: Diagnosis not present

## 2023-09-20 DIAGNOSIS — Z888 Allergy status to other drugs, medicaments and biological substances status: Secondary | ICD-10-CM | POA: Diagnosis not present

## 2023-09-20 DIAGNOSIS — Z951 Presence of aortocoronary bypass graft: Secondary | ICD-10-CM | POA: Diagnosis not present

## 2023-09-20 DIAGNOSIS — Z79899 Other long term (current) drug therapy: Secondary | ICD-10-CM | POA: Diagnosis not present

## 2023-09-20 DIAGNOSIS — Z880 Allergy status to penicillin: Secondary | ICD-10-CM | POA: Diagnosis not present

## 2023-09-20 LAB — CBC
HCT: 34.5 % — ABNORMAL LOW (ref 36.0–46.0)
Hemoglobin: 11.6 g/dL — ABNORMAL LOW (ref 12.0–15.0)
MCH: 30.5 pg (ref 26.0–34.0)
MCHC: 33.6 g/dL (ref 30.0–36.0)
MCV: 90.8 fL (ref 80.0–100.0)
Platelets: 178 10*3/uL (ref 150–400)
RBC: 3.8 MIL/uL — ABNORMAL LOW (ref 3.87–5.11)
RDW: 12.9 % (ref 11.5–15.5)
WBC: 8.2 10*3/uL (ref 4.0–10.5)
nRBC: 0 % (ref 0.0–0.2)

## 2023-09-20 LAB — BASIC METABOLIC PANEL
Anion gap: 4 — ABNORMAL LOW (ref 5–15)
BUN: 17 mg/dL (ref 8–23)
CO2: 25 mmol/L (ref 22–32)
Calcium: 8.8 mg/dL — ABNORMAL LOW (ref 8.9–10.3)
Chloride: 108 mmol/L (ref 98–111)
Creatinine, Ser: 0.79 mg/dL (ref 0.44–1.00)
GFR, Estimated: >60 mL/min (ref >60)
Glucose, Bld: 102 mg/dL — ABNORMAL HIGH (ref 70–99)
Potassium: 4.2 mmol/L (ref 3.5–5.1)
Sodium: 137 mmol/L (ref 135–145)

## 2023-09-20 NOTE — Progress Notes (Signed)
Established Patient Office Visit  Subjective   Patient ID: Catherine [name], female    DOB: 1935-07-28  Age: 88 y.o. MRN: [id number]  Chief Complaint  Patient presents with   Hospitalization Follow-up    HPI   Catherine [name] is seen for hospital follow-up from recent COVID and small pneumothorax.  She has multiple chronic problems including history of diastolic heart failure, CAD, aortic stenosis, hypertension, chronic kidney disease, kidney stones, chronic insomnia, chronic low back pain.  She has had history of mitral valve replacement with bioprosthetic valve 2018 as well as CABG.  Recent hospital discharge reviewed.  She was admitted on 14 December and discharged on the 17th.  History of atrial fibrillation and on chronic Coumadin.  Presented to the ER all day of admission with subjective fever, body aches, fatigue, cough, shortness of breath for a few days.  Also noticed some pleuritic pain with taking deep breath.  Chest x-ray showed small left pneumothorax 6 mm from the lung apex.  CT angiogram showed no PE or pneumonia but did confirm small left pneumothorax.  No mediastinal shift.  Testing was positive for COVID.  White count 7.6 thousand.  Electrolytes stable except for mildly low potassium.  She was treated with IV remdesivir and gradually improved with regard to her COVID.  Was noted to have some 4-second sinus pauses.  Has seen EP specialist and planning to have pacemaker placement later this week.  Currently has Zio patch in place.  No recent syncope.  Denies any significant pleuritic pain at this time.  No further fever.  Has battled with fatigue for quite some time but feels like she is improved compared with last week.  She had some worsening kidney function and diuretics were held at discharge.  Currently holding Bumex and metolazone.  No peripheral edema at this time.  There was discussion of switching her from Coumadin which has been on for years to Eliquis is she just darted the  Eliquis today.  She does had follow-up CBC today with hemoglobin stable.  Renal function improved and electrolytes stable  Past Medical History:  Diagnosis Date   Acute on chronic diastolic heart failure (HCC)    AKI (acute kidney injury) (HCC) 06/22/2022   Aortic stenosis, mild 07/26/2017   Arthritis    Asthma    last attack 02/2015   Atrial fibrillation, chronic (HCC)    Cellulitis of left lower extremity 08/17/2016   Ulcer associated with severe venous insufficiency   Chronic anticoagulation    Chronic diastolic CHF (congestive heart failure) (HCC)    Chronic kidney disease    "RIGHT MANY KIDNEY INFECTIONS AND STONES"   COPD (chronic obstructive pulmonary disease) (HCC)    Coronary artery disease    Dizziness    H/O: rheumatic fever    Heart murmur    Hypertension    PONV (postoperative nausea and vomiting)    ' SOMETIMES', BUT NOT ALWAYS"   Primary hypertension    RHEUMATIC MITRAL STENOSIS 01/05/2008   Qualifier: Diagnosis of   By: Maple Hudson MD, Clinton D        S/P Maze operation for atrial fibrillation 10/14/2016   Complete bilateral atrial lesion set using cryothermy and bipolar radiofrequency ablation - atrial appendage was not treated due to previous surgical procedure (open mitral commissurotomy)   S/P mitral valve replacement with bioprosthetic valve 10/14/2016   29 mm Medtronic Mosaic porcine bioprosthetic tissue valve   Sciatica 08/29/2023   Tricuspid regurgitation 07/14/2021   UTI (urinary tract  infection)    Valvular heart disease    Has mitral stenosis with prior mitral commissurotomy in 1970   Past Surgical History:  Procedure Laterality Date   ABDOMINAL HYSTERECTOMY  1983   endometriosis   APPENDECTOMY     BACK SURGERY     neurosurgery x2   CARDIAC CATHETERIZATION     CARDIAC CATHETERIZATION N/A 08/03/2016   Procedure: Right/Left Heart Cath and Coronary Angiography;  Surgeon: Peter M Swaziland, MD;  Location: [facility] INVASIVE CV LAB;  Service: Cardiovascular;   Laterality: N/A;   cataract surg     CHOLECYSTECTOMY N/A 05/30/2015   Procedure: LAPAROSCOPIC CHOLECYSTECTOMY WITH INTRAOPERATIVE CHOLANGIOGRAM;  Surgeon: Glenna Fellows, MD;  Location: [facility] OR;  Service: General;  Laterality: N/A;   COLONOSCOPY     CORONARY ARTERY BYPASS GRAFT N/A 10/14/2016   Procedure: CORONARY ARTERY BYPASS GRAFTING (CABG);  Surgeon: Purcell Nails, MD;  Location: [facility] OR;  Service: Open Heart Surgery;  Laterality: N/A;   EYE SURGERY     MAZE N/A 10/14/2016   Procedure: MAZE;  Surgeon: Purcell Nails, MD;  Location: [facility] OR;  Service: Open Heart Surgery;  Laterality: N/A;   MITRAL VALVE REPLACEMENT N/A 10/14/2016   Procedure: REDO MITRAL VALVE REPLACEMENT (MVR);  Surgeon: Purcell Nails, MD;  Location: [facility] OR;  Service: Open Heart Surgery;  Laterality: N/A;   MITRAL VALVE SURGERY Left 1970   Open mitral commissurotomy via left thoracotomy approach   TEE WITHOUT CARDIOVERSION N/A 07/05/2016   Procedure: TRANSESOPHAGEAL ECHOCARDIOGRAM (TEE);  Surgeon: Chilton Si, MD;  Location: [facility] ENDOSCOPY;  Service: Cardiovascular;  Laterality: N/A;   TEE WITHOUT CARDIOVERSION N/A 10/14/2016   Procedure: TRANSESOPHAGEAL ECHOCARDIOGRAM (TEE);  Surgeon: Purcell Nails, MD;  Location: [facility] OR;  Service: Open Heart Surgery;  Laterality: N/A;    reports that she quit smoking about 48 years ago. Her smoking use included cigarettes. She started smoking about 63 years ago. She has a 15 pack-year smoking history. She has never used smokeless tobacco. She reports that she does not drink alcohol and does not use drugs. family history includes Leukemia in her father. Allergies  Allergen Reactions   Aldactone [Spironolactone] Other (See Comments)    dyspnea   Amoxicillin Palpitations    Tachycardia Has patient had a PCN reaction causing immediate rash, facial/tongue/throat swelling, SOB or lightheadedness with hypotension: no Has patient had a PCN reaction causing severe rash involving mucus  membranes or skin necrosis: {no Has patient had a PCN reaction that required hospitalization {no Has patient had a PCN reaction occurring within the last 10 years: {yes If all of the above answers are "NO", then may proceed with Cephalosporin use.   Diltiazem Other (See Comments)    Causing headaches    Flagyl [Metronidazole Hcl] Other (See Comments)    Causing headaches     Flovent [Fluticasone Propionate] Other (See Comments)    Leg cramps   Gabapentin Swelling   Lyrica [Pregabalin] Swelling   Quinidine Diarrhea and Other (See Comments)    Fever diarrhea   Simvastatin Other (See Comments)    Leg pain, myalgia   Tramadol Nausea Only   Verapamil Other (See Comments)    myalgias   Amlodipine     Low extremity edema   Crestor [Rosuvastatin]     myalgia   Livalo [Pitavastatin]     myalgias   Polymyxin B-Trimethoprim Swelling   Zetia [Ezetimibe]     LEG CRAMPS    Benazepril Hcl Cough   Ciprofloxacin Diarrhea  Codeine Nausea Only   Nitrofurantoin Monohyd Macro Nausea Only    Review of Systems  Constitutional:  Positive for malaise/fatigue. Negative for chills and fever.  Respiratory:  Negative for cough and shortness of breath.   Cardiovascular:  Negative for chest pain.  Gastrointestinal:  Negative for abdominal pain.  Genitourinary:  Negative for dysuria.      Objective:     BP (!) 160/66 (BP Location: Left Arm, Patient Position: Sitting, Cuff Size: Normal)   Pulse 65   Temp 97.8 F (36.6 C) (Oral)   Wt 128 lb 12.8 oz (58.4 kg)   SpO2 97%   BMI 22.82 kg/m  BP Readings from Last 3 Encounters:  09/20/23 (!) 160/66  09/20/23 (!) 142/64  09/13/23 (!) 146/54   Wt Readings from Last 3 Encounters:  09/20/23 128 lb 12.8 oz (58.4 kg)  09/20/23 128 lb 12.8 oz (58.4 kg)  09/13/23 123 lb 3.8 oz (55.9 kg)      Physical Exam Vitals reviewed.  Constitutional:      General: She is not in acute distress. Cardiovascular:     Rate and Rhythm: Normal rate.   Pulmonary:     Effort: Pulmonary effort is normal.     Breath sounds: Normal breath sounds. No wheezing or rales.  Musculoskeletal:     Right lower leg: No edema.     Left lower leg: No edema.  Neurological:     Mental Status: She is alert.  Psychiatric:        Mood and Affect: Mood normal.      Results for orders placed or performed during the hospital encounter of 09/20/23  Basic Metabolic Panel (BMET)  Result Value Ref Range   Sodium 137 135 - 145 mmol/L   Potassium 4.2 3.5 - 5.1 mmol/L   Chloride 108 98 - 111 mmol/L   CO2 25 22 - 32 mmol/L   Glucose, Bld 102 (H) 70 - 99 mg/dL   BUN 17 8 - 23 mg/dL   Creatinine, Ser 1.61 0.44 - 1.00 mg/dL   Calcium 8.8 (L) 8.9 - 10.3 mg/dL   GFR, Estimated >09 >60 mL/min   Anion gap 4 (L) 5 - 15  CBC  Result Value Ref Range   WBC 8.2 4.0 - 10.5 K/uL   RBC 3.80 (L) 3.87 - 5.11 MIL/uL   Hemoglobin 11.6 (L) 12.0 - 15.0 g/dL   HCT 45.4 (L) 09.8 - 11.9 %   MCV 90.8 80.0 - 100.0 fL   MCH 30.5 26.0 - 34.0 pg   MCHC 33.6 30.0 - 36.0 g/dL   RDW 14.7 82.9 - 56.2 %   Platelets 178 150 - 400 K/uL   nRBC 0.0 0.0 - 0.2 %    Last CBC Lab Results  Component Value Date   WBC 8.2 09/20/2023   HGB 11.6 (L) 09/20/2023   HCT 34.5 (L) 09/20/2023   MCV 90.8 09/20/2023   MCH 30.5 09/20/2023   RDW 12.9 09/20/2023   PLT 178 09/20/2023   Last metabolic panel Lab Results  Component Value Date   GLUCOSE 102 (H) 09/20/2023   NA 137 09/20/2023   K 4.2 09/20/2023   CL 108 09/20/2023   CO2 25 09/20/2023   BUN 17 09/20/2023   CREATININE 0.79 09/20/2023   GFRNONAA >60 09/20/2023   CALCIUM 8.8 (L) 09/20/2023   PHOS 4.0 09/11/2023   PROT 5.1 (L) 09/13/2023   ALBUMIN 2.8 (L) 09/13/2023   LABGLOB 1.9 06/14/2022   AGRATIO 2.3 (H) 06/14/2022  BILITOT 0.4 09/13/2023   ALKPHOS 53 09/13/2023   AST 22 09/13/2023   ALT 11 09/13/2023   ANIONGAP 4 (L) 09/20/2023      The ASCVD Risk score (Arnett DK, et al., 2019) failed to calculate for the  following reasons:   The 2019 ASCVD risk score is only valid for ages 44 to 78    Assessment & Plan:   #1 recent small left pneumothorax.  Follow-up chest x-ray showed near resolution of pneumothorax.  She has no significant pain at this time.  #2 recent COVID-19 infection without hypoxia.  Treated with IV remdesivir.  Slowly improving.  #3 chronic kidney disease stage III.  Recent worsening of kidney function and diuretics held.  Renal function improved by labs this morning.  #4 chronic diastolic heart failure.  Currently appears to be euvolemic.  She knows to be doing daily weights and watch her volume intake improves may need to go back on Bumex metolazone.  #5 history of mitral valve replacement with bioprosthetic valve 2018.  Coumadin recently discontinued and now starting Eliquis.  #6 history of atrial fibrillation.  On anticoagulation as above.  #7 recent sinus pauses.  ZIO monitor in place.  Pending pacemaker.  45 minutes were spent including face-to-face evaluation and reviewing recent hospital discharge, recent labs, imaging studies and medication changes  Evelena Peat, MD

## 2023-09-20 NOTE — Patient Instructions (Signed)
Medication Instructions:  Your physician recommends that you continue on your current medications as directed. Please refer to the Current Medication list given to you today.  *If you need a refill on your cardiac medications before your next appointment, please call your pharmacy*   Lab Work: Your physician recommends that you return for lab work in: Today    If you have labs (blood work) drawn today and your tests are completely normal, you will receive your results only by: MyChart Message (if you have MyChart) OR A paper copy in the mail If you have any lab test that is abnormal or we need to change your treatment, we will call you to review the results.   Testing/Procedures: Your physician has recommended that you have a pacemaker inserted. A pacemaker is a small device that is placed under the skin of your chest or abdomen to help control abnormal heart rhythms. This device uses electrical pulses to prompt the heart to beat at a normal rate. Pacemakers are used to treat heart rhythms that are too slow. Wire (leads) are attached to the pacemaker that goes into the chambers of you heart. This is done in the hospital and usually requires and overnight stay. Please see the instruction sheet given to you today for more information.    Follow-Up: At [facility], you and your health needs are our priority.  As part of our continuing mission to provide you with exceptional heart care, we have created designated Provider Care Teams.  These Care Teams include your primary Cardiologist (physician) and Advanced Practice Providers (APPs -  Physician Assistants and Nurse Practitioners) who all work together to provide you with the care you need, when you need it.  We recommend signing up for the patient portal called "MyChart".  Sign up information is provided on this After Visit Summary.  MyChart is used to connect with patients for Virtual Visits (Telemedicine).  Patients are able to view  lab/test results, encounter notes, upcoming appointments, etc.  Non-urgent messages can be sent to your provider as well.   To learn more about what you can do with MyChart, go to [email or web address].    Your next appointment:   3 month(s)  Provider:   Lewayne Bunting, MD    Other Instructions Thank you for choosing [facility]!      Implantable Device Instructions    Catherine [name]  09/20/2023  You are scheduled for a Permanent transvenous pacemaker (PPM) on Friday, December 27 with Dr. Lewayne Bunting.  1. Pre procedure Lab testing:  [facility]     2. Please arrive at the Main Entrance A at [facility]: [address] [place], [place] [address] on December 27 at 7:30 AM (This time is two hours before your procedure to ensure your preparation). Free valet parking service is available. You will check in at ADMITTING. The support person will be asked to wait in the waiting room.  It is OK to have someone drop you off and come back when you are ready to be discharged.        Special note: Every effort is made to have your procedure done on time. Please understand that emergencies sometimes delay  scheduled procedures.  3.  No eating or drinking after midnight prior to procedure.     4.  Medication instructions:  On the morning of your procedure hold your Eliquis (Apixaban) for 2 day(s) prior to your procedure. Your last dose will be  Tuesday, December 23, PM dose.   5.  The night before your procedure and the morning of your procedure scrub your neck/chest with CHG surgical scrub.  See instruction letter.  6. Plan to go home the same day, you will only stay overnight if medically necessary. 7.  You MUST have a responsible adult to drive you home. 8.   An adult MUST be with you the first 24 hours after you arrive home. 9..  Bring a current list of your medications, and the last time and date medication taken. 10. Bring ID and current insurance  cards. 11. .Please wear clothes that are easy to get on and off and wear slip-on shoes.    You will follow up with the [facility] Device clinic 10-14 days after your procedure.  You will follow up with Dr. Lewayne Bunting 91 days after your procedure.  These appointments will be made for you.   * If you have ANY questions after you get home, please call the office at [phone] or send a MyChart message.  FYI: For your safety, and to allow [name] to monitor your vital signs accurately during the surgery/procedure we request that if you have artificial nails, gel coating, SNS etc. Please have those removed prior to your surgery/procedure. Not having the nail coverings /polish removed may result in cancellation or delay of your surgery/procedure.    [place] - Preparing For Surgery    Before surgery, you can play an important role. Because skin is not sterile, your skin needs to be as free of germs as possible. You can reduce the number of germs on your skin by washing with CHG (chlorahexidine gluconate) Soap before surgery.  CHG is an antiseptic cleaner which kills germs and bonds with the skin to continue killing germs even after washing.  Please do not use if you have an allergy to CHG or antibacterial soaps.  If your skin becomes reddened/irritated stop using the CHG.   Do not shave (including legs and underarms) for at least 48 hours prior to first CHG shower.  It is OK to shave your face.  Please follow these instructions carefully:  1.  Shower the night before surgery and the morning of surgery with CHG.  2.  If you choose to wash your hair, wash your hair first as usual with your normal shampoo.  3.  After you shampoo, rinse your hair and body thoroughly to remove the shampoo.  4.  Use CHG as you would any other liquid soap.  You can apply CHG directly to the skin and wash gently with a clean washcloth. 5.  Apply the CHG Soap to your body ONLY FROM THE NECK DOWN.  Do not use on open wounds  or open sores.  Avoid contact with your eyes, ears, mouth and genitals (private parts).  Wash genitals (private parts) with your normal soap.  6.  Wash thoroughly, paying special attention to the area where your surgery will be performed.  7.  Thoroughly rinse your body with warm water from the neck down.   8.  DO NOT shower/wash with your normal soap after using and rinsing off the CHG soap.  9.  Pat yourself dry with a clean towel.           10.  Wear clean pajamas.           11.  Place clean sheets on your bed the night of your first shower and do not sleep with pets.  Day of Surgery: Do not apply any deodorants/lotions.  Please wear clean clothes to the hospital/surgery center.

## 2023-09-20 NOTE — Progress Notes (Signed)
HPI Catherine [name] presents today for evaluation of bradycardia. She is a pleasant 88 yo woman who follows with Dr. Duke Salvia, previously Dr. Patty Sermons. She has an extensive cardiac history well documented a few weeks ago by Dr. Presley Raddle including prior Mitral valve replacement/MAZE/single vessel CABG. She has known long first degree AV block. She has persistent atrial fib but had maintained NSR on beta blockers and amio which she has been off over 2 weeks. She has worn a live Zio and was noted to have multiple pauses of over 4 seconds. She is on no sinus or AV nodal blocking drugs. She has not had frank syncope but feels fatigued. She had been in the hospital a few weeks ago with Covid and a PTX.  Allergies  Allergen Reactions   Aldactone [Spironolactone] Other (See Comments)    dyspnea   Amoxicillin Palpitations    Tachycardia Has patient had a PCN reaction causing immediate rash, facial/tongue/throat swelling, SOB or lightheadedness with hypotension: no Has patient had a PCN reaction causing severe rash involving mucus membranes or skin necrosis: {no Has patient had a PCN reaction that required hospitalization {no Has patient had a PCN reaction occurring within the last 10 years: {yes If all of the above answers are "NO", then may proceed with Cephalosporin use.   Diltiazem Other (See Comments)    Causing headaches    Flagyl [Metronidazole Hcl] Other (See Comments)    Causing headaches     Flovent [Fluticasone Propionate] Other (See Comments)    Leg cramps   Gabapentin Swelling   Lyrica [Pregabalin] Swelling   Quinidine Diarrhea and Other (See Comments)    Fever diarrhea   Simvastatin Other (See Comments)    Leg pain, myalgia   Tramadol Nausea Only   Verapamil Other (See Comments)    myalgias   Amlodipine     Low extremity edema   Crestor [Rosuvastatin]     myalgia   Livalo [Pitavastatin]     myalgias   Polymyxin B-Trimethoprim Swelling   Zetia [Ezetimibe]     LEG  CRAMPS    Benazepril Hcl Cough   Ciprofloxacin Diarrhea   Codeine Nausea Only   Nitrofurantoin Monohyd Macro Nausea Only     Current Outpatient Medications  Medication Sig Dispense Refill   acetaminophen (TYLENOL) 500 MG tablet Take 1,000 mg by mouth every 6 (six) hours as needed for mild pain.     albuterol (VENTOLIN HFA) 108 (90 Base) MCG/ACT inhaler INHALE 2 PUFFS INTO THE LUNGS EVERY 4 HOURS AS NEEDED FOR AHEEZING OR SHORTNESS OF BREATH 8.5 g 2   apixaban (ELIQUIS) 2.5 MG TABS tablet Take 1 tablet (2.5 mg total) by mouth 2 (two) times daily. 60 tablet 0   benzonatate (TESSALON) 100 MG capsule Take 1 capsule every 8 hours as needed for cough 30 capsule 0   [Paused] bumetanide (BUMEX) 2 MG tablet Please take 2mg  daily in the morning and then 2mg  in the afternoon on MONDAY, [facility] AND FRIDAY 120 tablet 3   cholecalciferol (VITAMIN D3) 25 MCG (1000 UNIT) tablet Take 1,000 Units by mouth daily.     diazepam (VALIUM) 5 MG tablet Take 1 tablet (5 mg total) by mouth at bedtime. 30 tablet 5   estradiol (ESTRACE VAGINAL) 0.1 MG/GM vaginal cream USE 2 grams per vagina daily for 2 weeks and then 1 gram per vagina three times per week. (Patient taking differently: USE 1 gram per vagina three times per week PRN) 42.5 g 11  fluticasone (FLONASE) 50 MCG/ACT nasal spray Place 1 spray into both nostrils daily as needed for allergies or rhinitis. 16 g 2   HYDROcodone-acetaminophen (NORCO/VICODIN) 5-325 MG tablet TAKE ONE TABLET EVERY 8 HOURS AS NEEDED FOR PAIN (Patient taking differently: Take 1 tablet by mouth every 8 (eight) hours as needed for moderate pain (pain score 4-6). TAKE ONE TABLET EVERY 8 HOURS AS NEEDED FOR PAIN) 90 tablet 0   hydrocortisone (ANUSOL-HC) 2.5 % rectal cream Place 1 Application rectally 2 (two) times daily. 30 g 3   ipratropium-albuterol (DUONEB) 0.5-2.5 (3) MG/3ML SOLN Take 3 mLs by nebulization every 4 (four) hours as needed. 360 mL 0   [Paused] metolazone (ZAROXOLYN) 5 MG  tablet TAKE ONE TABLET AS NEEDED ONCE A WEEK 30 MINUTES BEFORE BUMEX DOSE. AS NEEDED ONLY FOR WEIGHT GAIN 2LBS OVERNIGHT OR 5 LBS IN ONE WEEK 30 tablet 1   Multiple Vitamins-Minerals (HAIR/SKIN/NAILS/BIOTIN PO) Take 1 tablet by mouth daily.     Multiple Vitamins-Minerals (MULTIVITAMIN ADULT EXTRA C) CHEW Chew 2 tablets by mouth daily.     phenazopyridine (PYRIDIUM) 200 MG tablet Take 1 tablet (200 mg total) by mouth 3 (three) times daily as needed (urine pain). 60 tablet 0   [Paused] potassium chloride (KLOR-CON) 10 MEQ tablet TAKE UP TO 8 TABLETS DAILY AS DIRECTED (Patient taking differently: Take 80 mEq by mouth daily.) 720 tablet 3   sacubitril-valsartan (ENTRESTO) 24-26 MG Take 1 tablet by mouth 2 (two) times daily. 180 tablet 3   terconazole (TERAZOL 7) 0.4 % vaginal cream Place 1 applicator vaginally at bedtime. 45 g 0   No current facility-administered medications for this visit.     Past Medical History:  Diagnosis Date   Acute on chronic diastolic heart failure (HCC)    AKI (acute kidney injury) (HCC) 06/22/2022   Aortic stenosis, mild 07/26/2017   Arthritis    Asthma    last attack 02/2015   Atrial fibrillation, chronic (HCC)    Cellulitis of left lower extremity 08/17/2016   Ulcer associated with severe venous insufficiency   Chronic anticoagulation    Chronic diastolic CHF (congestive heart failure) (HCC)    Chronic kidney disease    "RIGHT MANY KIDNEY INFECTIONS AND STONES"   COPD (chronic obstructive pulmonary disease) (HCC)    Coronary artery disease    Dizziness    H/O: rheumatic fever    Heart murmur    Hypertension    PONV (postoperative nausea and vomiting)    ' SOMETIMES', BUT NOT ALWAYS"   Primary hypertension    RHEUMATIC MITRAL STENOSIS 01/05/2008   Qualifier: Diagnosis of   By: Maple Hudson MD, Clinton D        S/P Maze operation for atrial fibrillation 10/14/2016   Complete bilateral atrial lesion set using cryothermy and bipolar radiofrequency ablation -  atrial appendage was not treated due to previous surgical procedure (open mitral commissurotomy)   S/P mitral valve replacement with bioprosthetic valve 10/14/2016   29 mm Medtronic Mosaic porcine bioprosthetic tissue valve   Sciatica 08/29/2023   Tricuspid regurgitation 07/14/2021   UTI (urinary tract infection)    Valvular heart disease    Has mitral stenosis with prior mitral commissurotomy in 1970    ROS:   All systems reviewed and negative except as noted in the HPI.   Past Surgical History:  Procedure Laterality Date   ABDOMINAL HYSTERECTOMY  1983   endometriosis   APPENDECTOMY     BACK SURGERY     neurosurgery x2  CARDIAC CATHETERIZATION     CARDIAC CATHETERIZATION N/A 08/03/2016   Procedure: Right/Left Heart Cath and Coronary Angiography;  Surgeon: Peter M Swaziland, MD;  Location: [facility] INVASIVE CV LAB;  Service: Cardiovascular;  Laterality: N/A;   cataract surg     CHOLECYSTECTOMY N/A 05/30/2015   Procedure: LAPAROSCOPIC CHOLECYSTECTOMY WITH INTRAOPERATIVE CHOLANGIOGRAM;  Surgeon: Glenna Fellows, MD;  Location: [facility] OR;  Service: General;  Laterality: N/A;   COLONOSCOPY     CORONARY ARTERY BYPASS GRAFT N/A 10/14/2016   Procedure: CORONARY ARTERY BYPASS GRAFTING (CABG);  Surgeon: Purcell Nails, MD;  Location: [facility] OR;  Service: Open Heart Surgery;  Laterality: N/A;   EYE SURGERY     MAZE N/A 10/14/2016   Procedure: MAZE;  Surgeon: Purcell Nails, MD;  Location: [facility] OR;  Service: Open Heart Surgery;  Laterality: N/A;   MITRAL VALVE REPLACEMENT N/A 10/14/2016   Procedure: REDO MITRAL VALVE REPLACEMENT (MVR);  Surgeon: Purcell Nails, MD;  Location: [facility] OR;  Service: Open Heart Surgery;  Laterality: N/A;   MITRAL VALVE SURGERY Left 1970   Open mitral commissurotomy via left thoracotomy approach   TEE WITHOUT CARDIOVERSION N/A 07/05/2016   Procedure: TRANSESOPHAGEAL ECHOCARDIOGRAM (TEE);  Surgeon: Chilton Si, MD;  Location: [facility] ENDOSCOPY;  Service: Cardiovascular;   Laterality: N/A;   TEE WITHOUT CARDIOVERSION N/A 10/14/2016   Procedure: TRANSESOPHAGEAL ECHOCARDIOGRAM (TEE);  Surgeon: Purcell Nails, MD;  Location: [facility] OR;  Service: Open Heart Surgery;  Laterality: N/A;     Family History  Problem Relation Age of Onset   Leukemia Father    Breast cancer Neg Hx      Social History   Socioeconomic History   Marital status: Married    Spouse name: Not on file   Number of children: Not on file   Years of education: Not on file   Highest education level: Not on file  Occupational History   Not on file  Tobacco Use   Smoking status: Former    Current packs/day: 0.00    Average packs/day: 1 pack/day for 15.0 years (15.0 ttl pk-yrs)    Types: Cigarettes    Start date: 09/27/1960    Quit date: 09/28/1975    Years since quitting: 48.0   Smokeless tobacco: Never  Vaping Use   Vaping status: Never Used  Substance and Sexual Activity   Alcohol use: No    Alcohol/week: 0.0 standard drinks of alcohol   Drug use: No   Sexual activity: Yes  Other Topics Concern   Not on file  Social History Narrative   Not on file   Social Drivers of Health   Financial Resource Strain: Low Risk  (04/09/2022)   Overall Financial Resource Strain (CARDIA)    Difficulty of Paying Living Expenses: Not hard at all  Food Insecurity: No Food Insecurity (09/14/2023)   Hunger Vital Sign    Worried About Running Out of Food in the Last Year: Never true    Ran Out of Food in the Last Year: Never true  Transportation Needs: No Transportation Needs (09/14/2023)   PRAPARE - Administrator, Civil Service (Medical): No    Lack of Transportation (Non-Medical): No  Physical Activity: Insufficiently Active (04/09/2022)   Exercise Vital Sign    Days of Exercise per Week: 5 days    Minutes of Exercise per Session: 20 min  Stress: No Stress Concern Present (04/09/2022)   [employer] of Occupational Health - Occupational Stress Questionnaire    Feeling of Stress  :  Not at all  Social Connections: Socially Integrated (04/09/2022)   Social Connection and Isolation Panel [NHANES]    Frequency of Communication with Friends and Family: More than three times a week    Frequency of Social Gatherings with Friends and Family: More than three times a week    Attends Religious Services: More than 4 times per year    Active Member of [employer] or Organizations: Yes    Attends Engineer, structural: More than 4 times per year    Marital Status: Married  Catering manager Violence: Not At Risk (09/14/2023)   Humiliation, Afraid, Rape, and Kick questionnaire    Fear of Current or Ex-Partner: No    Emotionally Abused: No    Physically Abused: No    Sexually Abused: No     BP (!) 142/64   Pulse (!) 56   Ht 5\' 3"  (1.6 m)   Wt 128 lb 12.8 oz (58.4 kg)   SpO2 96%   BMI 22.82 kg/m   Physical Exam:  Well appearing NAD HEENT: Unremarkable Neck:  No JVD, no thyromegally Lymphatics:  No adenopathy Back:  No CVA tenderness Lungs:  Clear with no wheezes HEART:  Regular rate rhythm, 3/6 systolic murmurs, no rubs, no clicks Abd:  soft, positive bowel sounds, no organomegally, no rebound, no guarding Ext:  2 plus pulses, no edema, no cyanosis, no clubbing Skin:  No rashes no nodules Neuro:  CN II through XII intact, motor grossly intact  EKG - reviewed NSR with long first degree AV block   Assess/Plan: Sinus node dysfunction - she is having long pauses of over 4 seconds and feels fatigued. Despite not having frank syncope, I am certain the spells will worsen. I have offered her insertion of a DDD PM. The indications/risks/benefits/goals/expectations of PPM insertion and she is willing to proceed.  TR - we will use a 5 Fr pacing lead to minimize worsening of the TR.  Persistent atrial fib - we will consider restarting the amio once the PPM is in place.   Catherine Gowda Nikolaus Pienta,MD

## 2023-09-21 ENCOUNTER — Encounter (HOSPITAL_BASED_OUTPATIENT_CLINIC_OR_DEPARTMENT_OTHER): Payer: Self-pay

## 2023-09-21 ENCOUNTER — Other Ambulatory Visit: Payer: Self-pay

## 2023-09-21 ENCOUNTER — Inpatient Hospital Stay (HOSPITAL_BASED_OUTPATIENT_CLINIC_OR_DEPARTMENT_OTHER)
Admission: EM | Admit: 2023-09-21 | Discharge: 2023-09-23 | DRG: 243 | Disposition: A | Discharge status: Home / Self Care | Payer: Medicare Other | Attending: Internal Medicine | Admitting: Internal Medicine

## 2023-09-21 ENCOUNTER — Emergency Department (HOSPITAL_BASED_OUTPATIENT_CLINIC_OR_DEPARTMENT_OTHER): Payer: Medicare Other

## 2023-09-21 DIAGNOSIS — Z806 Family history of leukemia: Secondary | ICD-10-CM | POA: Diagnosis not present

## 2023-09-21 DIAGNOSIS — I455 Other specified heart block: Principal | ICD-10-CM | POA: Diagnosis present

## 2023-09-21 DIAGNOSIS — I440 Atrioventricular block, first degree: Secondary | ICD-10-CM | POA: Diagnosis present

## 2023-09-21 DIAGNOSIS — Z951 Presence of aortocoronary bypass graft: Secondary | ICD-10-CM

## 2023-09-21 DIAGNOSIS — R001 Bradycardia, unspecified: Secondary | ICD-10-CM | POA: Diagnosis not present

## 2023-09-21 DIAGNOSIS — Z87891 Personal history of nicotine dependence: Secondary | ICD-10-CM

## 2023-09-21 DIAGNOSIS — Z8616 Personal history of COVID-19: Secondary | ICD-10-CM | POA: Diagnosis not present

## 2023-09-21 DIAGNOSIS — R0602 Shortness of breath: Secondary | ICD-10-CM | POA: Diagnosis not present

## 2023-09-21 DIAGNOSIS — Z7901 Long term (current) use of anticoagulants: Secondary | ICD-10-CM

## 2023-09-21 DIAGNOSIS — Z79818 Long term (current) use of other agents affecting estrogen receptors and estrogen levels: Secondary | ICD-10-CM

## 2023-09-21 DIAGNOSIS — I5032 Chronic diastolic (congestive) heart failure: Secondary | ICD-10-CM | POA: Diagnosis present

## 2023-09-21 DIAGNOSIS — Z79899 Other long term (current) drug therapy: Secondary | ICD-10-CM | POA: Diagnosis not present

## 2023-09-21 DIAGNOSIS — I130 Hypertensive heart and chronic kidney disease with heart failure and stage 1 through stage 4 chronic kidney disease, or unspecified chronic kidney disease: Secondary | ICD-10-CM | POA: Diagnosis not present

## 2023-09-21 DIAGNOSIS — R0989 Other specified symptoms and signs involving the circulatory and respiratory systems: Secondary | ICD-10-CM | POA: Diagnosis not present

## 2023-09-21 DIAGNOSIS — Z881 Allergy status to other antibiotic agents status: Secondary | ICD-10-CM | POA: Diagnosis not present

## 2023-09-21 DIAGNOSIS — Z885 Allergy status to narcotic agent status: Secondary | ICD-10-CM

## 2023-09-21 DIAGNOSIS — I2510 Atherosclerotic heart disease of native coronary artery without angina pectoris: Secondary | ICD-10-CM | POA: Diagnosis not present

## 2023-09-21 DIAGNOSIS — I083 Combined rheumatic disorders of mitral, aortic and tricuspid valves: Secondary | ICD-10-CM | POA: Diagnosis present

## 2023-09-21 DIAGNOSIS — G479 Sleep disorder, unspecified: Secondary | ICD-10-CM | POA: Diagnosis present

## 2023-09-21 DIAGNOSIS — I480 Paroxysmal atrial fibrillation: Secondary | ICD-10-CM | POA: Diagnosis present

## 2023-09-21 DIAGNOSIS — E785 Hyperlipidemia, unspecified: Secondary | ICD-10-CM | POA: Diagnosis present

## 2023-09-21 DIAGNOSIS — R918 Other nonspecific abnormal finding of lung field: Secondary | ICD-10-CM | POA: Diagnosis not present

## 2023-09-21 DIAGNOSIS — N189 Chronic kidney disease, unspecified: Secondary | ICD-10-CM | POA: Diagnosis not present

## 2023-09-21 DIAGNOSIS — Z953 Presence of xenogenic heart valve: Secondary | ICD-10-CM

## 2023-09-21 DIAGNOSIS — J9811 Atelectasis: Secondary | ICD-10-CM | POA: Diagnosis not present

## 2023-09-21 DIAGNOSIS — J4489 Other specified chronic obstructive pulmonary disease: Secondary | ICD-10-CM | POA: Diagnosis present

## 2023-09-21 DIAGNOSIS — I495 Sick sinus syndrome: Principal | ICD-10-CM | POA: Diagnosis present

## 2023-09-21 DIAGNOSIS — Z950 Presence of cardiac pacemaker: Secondary | ICD-10-CM | POA: Diagnosis not present

## 2023-09-21 DIAGNOSIS — J90 Pleural effusion, not elsewhere classified: Secondary | ICD-10-CM | POA: Diagnosis not present

## 2023-09-21 DIAGNOSIS — Z880 Allergy status to penicillin: Secondary | ICD-10-CM | POA: Diagnosis not present

## 2023-09-21 DIAGNOSIS — Z888 Allergy status to other drugs, medicaments and biological substances status: Secondary | ICD-10-CM

## 2023-09-21 LAB — CBC WITH DIFFERENTIAL/PLATELET
Abs Immature Granulocytes: 0.01 10*3/uL (ref 0.00–0.07)
Basophils Absolute: 0 10*3/uL (ref 0.0–0.1)
Basophils Relative: 0 %
Eosinophils Absolute: 0.1 10*3/uL (ref 0.0–0.5)
Eosinophils Relative: 2 %
HCT: 33 % — ABNORMAL LOW (ref 36.0–46.0)
Hemoglobin: 11.3 g/dL — ABNORMAL LOW (ref 12.0–15.0)
Immature Granulocytes: 0 %
Lymphocytes Relative: 24 %
Lymphs Abs: 1.4 10*3/uL (ref 0.7–4.0)
MCH: 30.5 pg (ref 26.0–34.0)
MCHC: 34.2 g/dL (ref 30.0–36.0)
MCV: 89.2 fL (ref 80.0–100.0)
Monocytes Absolute: 0.6 10*3/uL (ref 0.1–1.0)
Monocytes Relative: 10 %
Neutro Abs: 3.6 10*3/uL (ref 1.7–7.7)
Neutrophils Relative %: 64 %
Platelets: 179 10*3/uL (ref 150–400)
RBC: 3.7 MIL/uL — ABNORMAL LOW (ref 3.87–5.11)
RDW: 13 % (ref 11.5–15.5)
WBC: 5.7 10*3/uL (ref 4.0–10.5)
nRBC: 0 % (ref 0.0–0.2)

## 2023-09-21 LAB — BASIC METABOLIC PANEL
Anion gap: 5 (ref 5–15)
BUN: 15 mg/dL (ref 8–23)
CO2: 28 mmol/L (ref 22–32)
Calcium: 9 mg/dL (ref 8.9–10.3)
Chloride: 107 mmol/L (ref 98–111)
Creatinine, Ser: 0.76 mg/dL (ref 0.44–1.00)
GFR, Estimated: >60 mL/min (ref >60)
Glucose, Bld: 100 mg/dL — ABNORMAL HIGH (ref 70–99)
Potassium: 4 mmol/L (ref 3.5–5.1)
Sodium: 140 mmol/L (ref 135–145)

## 2023-09-21 LAB — MRSA NEXT GEN BY PCR, NASAL: MRSA by PCR Next Gen: NOT DETECTED

## 2023-09-21 LAB — TSH: TSH: 3.454 u[IU]/mL (ref 0.350–4.500)

## 2023-09-21 LAB — TROPONIN I (HIGH SENSITIVITY)
Troponin I (High Sensitivity): 33 ng/L — ABNORMAL HIGH (ref <18)
Troponin I (High Sensitivity): 34 ng/L — ABNORMAL HIGH (ref <18)

## 2023-09-21 MED ORDER — ATROPINE SULFATE 1 MG/10ML IJ SOSY
PREFILLED_SYRINGE | INTRAMUSCULAR | Status: AC
  Filled 2023-09-21: qty 10

## 2023-09-21 MED ORDER — DIAZEPAM 5 MG PO TABS
5.0000 mg | ORAL_TABLET | Freq: Every day | ORAL | Status: DC
  Administered 2023-09-21: 5 mg via ORAL
  Filled 2023-09-21: qty 1

## 2023-09-21 MED ORDER — ALBUTEROL SULFATE HFA 108 (90 BASE) MCG/ACT IN AERS: 1.0000 | INHALATION_SPRAY | RESPIRATORY_TRACT | Status: DC | PRN

## 2023-09-21 MED ORDER — ACETAMINOPHEN 500 MG PO TABS: 1000.0000 mg | ORAL_TABLET | Freq: Four times a day (QID) | ORAL | Status: DC | PRN

## 2023-09-21 MED ORDER — CHLORHEXIDINE GLUCONATE CLOTH 2 % EX PADS
6.0000 | MEDICATED_PAD | Freq: Every day | CUTANEOUS | Status: DC
  Administered 2023-09-21 – 2023-09-23 (×3): 6 via TOPICAL

## 2023-09-21 MED ORDER — ALBUTEROL SULFATE (2.5 MG/3ML) 0.083% IN NEBU
2.5000 mg | INHALATION_SOLUTION | RESPIRATORY_TRACT | Status: DC | PRN
  Administered 2023-09-21 – 2023-09-22 (×2): 2.5 mg via RESPIRATORY_TRACT
  Filled 2023-09-21 (×2): qty 3

## 2023-09-21 MED ORDER — HYDROCODONE-ACETAMINOPHEN 5-325 MG PO TABS
1.0000 | ORAL_TABLET | Freq: Three times a day (TID) | ORAL | Status: DC | PRN
  Administered 2023-09-22 – 2023-09-23 (×2): 1 via ORAL
  Filled 2023-09-21 (×2): qty 1

## 2023-09-21 MED ORDER — ONDANSETRON HCL 4 MG/2ML IJ SOLN: 4.0000 mg | Freq: Four times a day (QID) | INTRAMUSCULAR | Status: DC | PRN

## 2023-09-21 MED ORDER — FLUTICASONE PROPIONATE 50 MCG/ACT NA SUSP: 1.0000 | Freq: Every day | NASAL | Status: DC | PRN

## 2023-09-21 MED ORDER — SACUBITRIL-VALSARTAN 24-26 MG PO TABS
1.0000 | ORAL_TABLET | Freq: Two times a day (BID) | ORAL | Status: DC
  Administered 2023-09-21 – 2023-09-23 (×5): 1 via ORAL
  Filled 2023-09-21 (×6): qty 1

## 2023-09-21 NOTE — Plan of Care (Signed)
  Problem: Education: Goal: Knowledge of risk factors and measures for prevention of condition will improve Outcome: Progressing   Problem: Coping: Goal: Psychosocial and spiritual needs will be supported Outcome: Progressing   Problem: Respiratory: Goal: Will maintain a patent airway Outcome: Progressing Goal: Complications related to the disease process, condition or treatment will be avoided or minimized Outcome: Progressing   Problem: Education: Goal: Knowledge of General Education information will improve Description: Including pain rating scale, medication(s)/side effects and non-pharmacologic comfort measures Outcome: Progressing   Problem: Health Behavior/Discharge Planning: Goal: Ability to manage health-related needs will improve Outcome: Progressing   Problem: Clinical Measurements: Goal: Ability to maintain clinical measurements within normal limits will improve Outcome: Progressing Goal: Will remain free from infection Outcome: Progressing Goal: Diagnostic test results will improve Outcome: Progressing Goal: Respiratory complications will improve Outcome: Progressing Goal: Cardiovascular complication will be avoided Outcome: Progressing   Problem: Activity: Goal: Risk for activity intolerance will decrease Outcome: Progressing   Problem: Nutrition: Goal: Adequate nutrition will be maintained Outcome: Progressing   Problem: Coping: Goal: Level of anxiety will decrease Outcome: Progressing   Problem: Elimination: Goal: Will not experience complications related to bowel motility Outcome: Progressing Goal: Will not experience complications related to urinary retention Outcome: Progressing   Problem: Pain Management: Goal: General experience of comfort will improve Outcome: Progressing   Problem: Safety: Goal: Ability to remain free from injury will improve Outcome: Progressing   Problem: Skin Integrity: Goal: Risk for impaired skin integrity will  decrease Outcome: Progressing

## 2023-09-21 NOTE — ED Triage Notes (Signed)
Pt reports being off of eloquis beginning last night per instructions from provider in preparation for pacemaker insertion.

## 2023-09-21 NOTE — ED Triage Notes (Signed)
Pt coming in after being called while asleep being told her cardiac rhythm had "something wrong" and that she needs to come to the ER for evaluation. Pt reports briefly feeling like she might pass out when she was en route to hospital and states her breathing feels "funny". No acute respiratory distress noted, patient talking in full sentences, breathing unlabored and easy. Pt denies CP. Zio patch on patient's chest. Due for pacemaker to be placed on Friday. Pt brought into hospital 1 week ago for similar issue per patient.

## 2023-09-21 NOTE — Consult Note (Signed)
Brief consult note  09/20/23 10pm: Called by [employer] that patient had 2 pauses - one 6.9 sec and one 6.3 sec each returning to sinus rhythm, they called patient and each were happening while she was falling in/out of sleep and asymptomatic.  She was seen by cardiology and family medicine earlier in the day with a plan made for elective PM implantation on Friday.  At the time of her clinic visits, longest pause was slightly over 4 seconds.  I instructed [employer] that if another pause occurred or if the patient was symptomatic, she may need inpatient monitoring +/- temporary pacing which would require her to be hospitalized.  09/21/23 2am: [employer] called again with a third episode of 6+ second pause, this time associated with symptoms of feeling like she might pass out.  Instructed ED evaluation  09/21/23 4am: [facility] called after evaluation, while there, she is intermittently having pauses and ECG shows intermittent junctional rhythm, confirmed she was symptomatic during these episodes. Electrolytes ok. Troponin 33.  I am concerned that an 88yo woman with prolonged pauses associated with presyncope is at high risk of asystolic arrest.  She has known heart disease with prior CABG and mitral valve replacement.  She is not on any nodal blocking or slowing agents.    I would recommend very close telemetry follow-up, transcutaneous pads to be applied, atropine at the bedside.  Her pacemaker insertion might be moved up earlier if possible. If not and she has longer pauses or more symptomatic pauses, she may need a transvenous pacing wire for safety.  Yu-Ping Ahuva Poynor 09/21/2023 4:30am

## 2023-09-21 NOTE — ED Provider Notes (Signed)
[place] EMERGENCY DEPARTMENT AT [facility] Provider Note   CSN: [id number] Arrival date & time: 09/21/23  0221     History  Chief Complaint  Patient presents with   Pacemaker Problem    Catherine [name] is a 88 y.o. female.  Patient is an 88 year old female with past medical history of atrial fibrillation on Eliquis, prior mitral valve replacement, CHF.  Patient also recently admitted for a small pneumothorax.  She had a Zio patch applied during this recent hospitalization.  She was called this morning and advised by the Zio patch representative to come to the ER for some sort of heart rhythm issue.  Patient has no symptoms and states that she feels fine.  Patient also found to be COVID-positive during this recent hospitalization.  The history is provided by the patient.       Home Medications Prior to Admission medications   Medication Sig Start Date End Date Taking? Authorizing Provider  acetaminophen (TYLENOL) 500 MG tablet Take 1,000 mg by mouth every 6 (six) hours as needed for mild pain.    [provider]  albuterol (VENTOLIN HFA) 108 (90 Base) MCG/ACT inhaler INHALE 2 PUFFS INTO THE LUNGS EVERY 4 HOURS AS NEEDED FOR AHEEZING OR SHORTNESS OF BREATH 09/02/22   Coralyn Helling, MD  apixaban (ELIQUIS) 2.5 MG TABS tablet Take 1 tablet (2.5 mg total) by mouth 2 (two) times daily. 09/19/23 10/19/23  Carollee Herter, DO  bumetanide (BUMEX) 2 MG tablet Please take 2mg  daily in the morning and then 2mg  in the afternoon on MONDAY, [facility] AND FRIDAY 10/12/22   Alver Sorrow, NP  cholecalciferol (VITAMIN D3) 25 MCG (1000 UNIT) tablet Take 1,000 Units by mouth daily.    [provider]  diazepam (VALIUM) 5 MG tablet Take 1 tablet (5 mg total) by mouth at bedtime. 05/05/23   Burchette, Elberta Fortis, MD  estradiol (ESTRACE VAGINAL) 0.1 MG/GM vaginal cream USE 2 grams per vagina daily for 2 weeks and then 1 gram per vagina three times per week. Patient taking differently:  USE 1 gram per vagina three times per week PRN 07/27/22   Burchette, Elberta Fortis, MD  fluticasone (FLONASE) 50 MCG/ACT nasal spray Place 1 spray into both nostrils daily as needed for allergies or rhinitis. 08/30/22   Coralyn Helling, MD  HYDROcodone-acetaminophen (NORCO/VICODIN) 5-325 MG tablet TAKE ONE TABLET EVERY 8 HOURS AS NEEDED FOR PAIN Patient taking differently: Take 1 tablet by mouth every 8 (eight) hours as needed for moderate pain (pain score 4-6). TAKE ONE TABLET EVERY 8 HOURS AS NEEDED FOR PAIN 08/29/23   Burchette, Elberta Fortis, MD  hydrocortisone (ANUSOL-HC) 2.5 % rectal cream Place 1 Application rectally 2 (two) times daily. 02/28/23   Philip Aspen, Limmie Patricia, MD  ipratropium-albuterol (DUONEB) 0.5-2.5 (3) MG/3ML SOLN Take 3 mLs by nebulization every 4 (four) hours as needed. 09/13/23 10/13/23  Carollee Herter, DO  metolazone (ZAROXOLYN) 5 MG tablet TAKE ONE TABLET AS NEEDED ONCE A WEEK 30 MINUTES BEFORE BUMEX DOSE. AS NEEDED ONLY FOR WEIGHT GAIN 2LBS OVERNIGHT OR 5 LBS IN ONE WEEK 10/11/22   Alver Sorrow, NP  Multiple Vitamins-Minerals (HAIR/SKIN/NAILS/BIOTIN PO) Take 1 tablet by mouth daily.    [provider]  Multiple Vitamins-Minerals (MULTIVITAMIN ADULT EXTRA C) CHEW Chew 2 tablets by mouth daily.    [provider]  potassium chloride (KLOR-CON) 10 MEQ tablet TAKE UP TO 8 TABLETS DAILY AS DIRECTED Patient taking differently: Take 80 mEq by mouth daily. 07/15/23  Chilton Si, MD  sacubitril-valsartan (ENTRESTO) 24-26 MG Take 1 tablet by mouth 2 (two) times daily. 07/27/23   Chilton Si, MD      Allergies    Aldactone [spironolactone], Amoxicillin, Diltiazem, Flagyl [metronidazole hcl], Flovent [fluticasone propionate], Gabapentin, Lyrica [pregabalin], Quinidine, Simvastatin, Tramadol, Verapamil, Amlodipine, Crestor [rosuvastatin], Livalo [pitavastatin], Polymyxin b-trimethoprim, Zetia [ezetimibe], Benazepril hcl, Ciprofloxacin, Codeine, and Nitrofurantoin monohyd  macro    Review of Systems   Review of Systems  All other systems reviewed and are negative.   Physical Exam Updated Vital Signs BP (!) 157/72   Pulse 80   Temp 98.2 F (36.8 C) (Oral)   Resp 14   Ht 5\' 3"  (1.6 m)   Wt 58.1 kg   SpO2 99%   BMI 22.67 kg/m  Physical Exam Vitals and nursing note reviewed.  Constitutional:      General: She is not in acute distress.    Appearance: She is well-developed. She is not diaphoretic.  HENT:     Head: Normocephalic and atraumatic.  Cardiovascular:     Rate and Rhythm: Normal rate and regular rhythm.     Heart sounds: No murmur heard.    No friction rub. No gallop.  Pulmonary:     Effort: Pulmonary effort is normal. No respiratory distress.     Breath sounds: Normal breath sounds. No wheezing.  Abdominal:     General: Bowel sounds are normal. There is no distension.     Palpations: Abdomen is soft.     Tenderness: There is no abdominal tenderness.  Musculoskeletal:        General: Normal range of motion.     Cervical back: Normal range of motion and neck supple.  Skin:    General: Skin is warm and dry.  Neurological:     General: No focal deficit present.     Mental Status: She is alert and oriented to person, place, and time.     ED Results / Procedures / Treatments   Labs (all labs ordered are listed, but only abnormal results are displayed) Labs Reviewed - No data to display  EKG None  Radiology No results found.  Procedures Procedures    Medications Ordered in ED Medications - No data to display  ED Course/ Medical Decision Making/ A&P  Patient is a an 88 year old female with history of atrial fibrillation on Eliquis and prior mitral valve replacement.  Patient advised to come to the ER by the Zio patch representative due to having multiple 6+ second pauses.  Patient arrives here with stable vital signs and is afebrile.  Physical examination basically unremarkable.  Laboratory studies obtained including  CBC, metabolic panel, and troponin, all of which are unremarkable.  Chest x-ray showing cardiomegaly and vascular congestion.  Patient has been experiencing episodes of bradycardia with heart rate lowering into the 30s.  This appears to be a junctional escape rhythm and is also having pauses several seconds long.  This situation was discussed with Dr. Regino Schultze the fellow on duty for cardiology.  It is her assessment that the patient should be admitted for observation and likely pacemaker implantation.  Patient will be admitted to the intensive care unit under the cardiology service.  Patient has been observed here in the ER with pacemaker pads in place and atropine at bedside.  CRITICAL CARE Performed by: Geoffery Lyons Total critical care time: 35 minutes Critical care time was exclusive of separately billable procedures and treating other patients. Critical care was necessary to treat or prevent  imminent or life-threatening deterioration. Critical care was time spent personally by me on the following activities: development of treatment plan with patient and/or surrogate as well as nursing, discussions with consultants, evaluation of patient's response to treatment, examination of patient, obtaining history from patient or surrogate, ordering and performing treatments and interventions, ordering and review of laboratory studies, ordering and review of radiographic studies, pulse oximetry and re-evaluation of patient's condition.   Final Clinical Impression(s) / ED Diagnoses Final diagnoses:  None    Rx / DC Orders ED Discharge Orders     None         Geoffery Lyons, MD 09/21/23 [phone]

## 2023-09-21 NOTE — ED Notes (Signed)
[facility] at bedside packaging pt. Report given to transport team.

## 2023-09-21 NOTE — H&P (Cosign Needed Addendum)
Cardiology Admission History and Physical   Patient ID: Catherine [name] MRN: [id number]; DOB: 1934/11/29   Admission date: 09/21/2023  PCP:  Kristian Covey, MD   [facility] Providers Cardiologist:  Chilton Si, MD   {  Chief Complaint:  sinus pauses  Patient Profile:   Catherine [name] is a 88 y.o. 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  History of Present Illness:   Ms. Niezgoda has history of mitral commissurotomy in 78s and had severe mitral stenosis with a mean gradient of 13, MVA 0.99 as well as a 50% stenotic left circumflex and 80% RCA.  She underwent single vessel CABG with maze in January 2018.  She has had mild aortic stenosis that has been stable.  She also has history of atrial fibrillation that had not been clearly identified as she has also had a long PR interval making it difficult to diagnose.  Followed by the A-fib clinic.  Previously on amiodarone however not tolerated due to hair falling out in it affecting Coumadin levels so this was eventually discontinued.  For her chronic HFpEF she has been on Bumex, metolazone, Entresto however this has been held with recent hospitalizations.  Additionally, has had decreased dependency on this with as needed dosing.  Has not been on an SGLT2 inhibitor due to recurrent UTIs.  More recently she has been admitted for sinus pauses that have been documented since at least 2022 but appear to have been occurring with more frequency and extended duration.  Most recent admission was on 09/11/2023 where she was evaluated for COVID-19 infection and had some pleuritic chest pain associated with small left pneumothorax that did not require any interventions.  Cardiology consulted  for 4-second sinus pauses (previously 2.5) noted on telemetry.  Metoprolol was stopped during this admission in she was discharged on 14-day heart monitor with plans to see EP, additionally was transition from Coumadin to low-dose Eliquis due to issues maintaining therapeutic dosing.  Since then she has had multiple heart rhythm notifications from our team indicating continued 4-second pauses almost daily for the past 3 days.  Eventually saw Dr. Ladona Ridgel 09/20/2023 with plans for PPM.  Fortunately has not had any syncopal episodes or falls.  Today at approximately 4 AM she had 6.3 seconds pause and was sleeping.  She was advised to come to the emergency room by overnight fellow.  She has now been transferred to [facility] and has transcutaneous pacing pads and atropine at the bedside.  Still, patient reports no significant episodes of syncope or falls.  She reports that most of these notifications occur at night and she has been generally been asymptomatic however she does note some episodes more recently where she has a very brief sensation that she might pass out however again still has not done so and has been stable.  Reports some dizziness.  Today she is accompanied with her husband and family without any complaints of chest pain or shortness of breath.  She confirms that she has not been taking her Eliquis as well as her diuretics and metoprolol.  She has had one 3-second pause while here.  Heart rates in the 50s.  Vital signs otherwise stable.  No electrolyte abnormalities, chronically elevated troponins 33-34.  Hemoglobin 11.3.  Past Medical History:  Diagnosis Date   Acute on chronic diastolic heart failure (HCC)    AKI (acute kidney injury) (HCC) 06/22/2022   Aortic stenosis, mild 07/26/2017   Arthritis    Asthma    last attack 02/2015   Atrial fibrillation, chronic (HCC)    Cellulitis of left lower extremity 08/17/2016   Ulcer associated with severe venous insufficiency   Chronic  anticoagulation    Chronic diastolic CHF (congestive heart failure) (HCC)    Chronic kidney disease    "RIGHT MANY KIDNEY INFECTIONS AND STONES"   COPD (chronic obstructive pulmonary disease) (HCC)    Coronary artery disease    Dizziness    H/O: rheumatic fever    Heart murmur    Hypertension    PONV (postoperative nausea and vomiting)    ' SOMETIMES', BUT NOT ALWAYS"   Primary hypertension    RHEUMATIC MITRAL STENOSIS 01/05/2008   Qualifier: Diagnosis of   By: Maple Hudson MD, Clinton D        S/P Maze operation for atrial fibrillation 10/14/2016   Complete bilateral atrial lesion set using cryothermy and bipolar radiofrequency ablation - atrial appendage was not treated due to previous surgical procedure (open mitral commissurotomy)   S/P mitral valve replacement with bioprosthetic valve 10/14/2016   29 mm Medtronic Mosaic porcine bioprosthetic tissue valve   Sciatica 08/29/2023   Tricuspid regurgitation 07/14/2021   UTI (urinary tract infection)    Valvular heart disease    Has mitral stenosis with prior mitral commissurotomy in 1970    Past Surgical History:  Procedure Laterality Date   ABDOMINAL HYSTERECTOMY  1983   endometriosis   APPENDECTOMY     BACK SURGERY     neurosurgery x2   CARDIAC CATHETERIZATION     CARDIAC CATHETERIZATION N/A 08/03/2016   Procedure: Right/Left Heart Cath and Coronary Angiography;  Surgeon: Peter M Swaziland, MD;  Location: [facility] INVASIVE CV LAB;  Service: Cardiovascular;  Laterality: N/A;   cataract surg     CHOLECYSTECTOMY N/A 05/30/2015   Procedure: LAPAROSCOPIC CHOLECYSTECTOMY WITH INTRAOPERATIVE CHOLANGIOGRAM;  Surgeon: Glenna Fellows, MD;  Location: [facility] OR;  Service: General;  Laterality: N/A;   COLONOSCOPY     CORONARY ARTERY BYPASS GRAFT N/A 10/14/2016   Procedure: CORONARY ARTERY BYPASS GRAFTING (CABG);  Surgeon: Purcell Nails, MD;  Location: [facility] OR;  Service: Open Heart Surgery;  Laterality: N/A;   EYE SURGERY     MAZE N/A 10/14/2016    Procedure: MAZE;  Surgeon: Purcell Nails, MD;  Location: [facility] OR;  Service: Open Heart Surgery;  Laterality: N/A;   MITRAL VALVE REPLACEMENT N/A 10/14/2016   Procedure: REDO MITRAL VALVE REPLACEMENT (MVR);  Surgeon: Purcell Nails, MD;  Location: [facility] OR;  Service: Open Heart Surgery;  Laterality: N/A;   MITRAL VALVE SURGERY Left 1970   Open mitral commissurotomy via left thoracotomy approach   TEE WITHOUT CARDIOVERSION N/A 07/05/2016   Procedure: TRANSESOPHAGEAL ECHOCARDIOGRAM (TEE);  Surgeon: Chilton Si, MD;  Location: [facility] ENDOSCOPY;  Service: Cardiovascular;  Laterality: N/A;   TEE WITHOUT CARDIOVERSION N/A 10/14/2016   Procedure: TRANSESOPHAGEAL ECHOCARDIOGRAM (TEE);  Surgeon: Purcell Nails, MD;  Location: [facility] OR;  Service: Open Heart Surgery;  Laterality: N/A;     Medications Prior to Admission: Prior to Admission medications   Medication Sig Start Date End Date Taking? Authorizing Provider  HYDROcodone-acetaminophen (NORCO/VICODIN) 5-325 MG tablet TAKE ONE TABLET EVERY 8 HOURS AS NEEDED FOR PAIN 08/29/23  Yes Burchette, Elberta Fortis, MD  acetaminophen (TYLENOL) 500  MG tablet Take 1,000 mg by mouth every 6 (six) hours as needed for mild pain.    [provider]  albuterol (VENTOLIN HFA) 108 (90 Base) MCG/ACT inhaler INHALE 2 PUFFS INTO THE LUNGS EVERY 4 HOURS AS NEEDED FOR AHEEZING OR SHORTNESS OF BREATH 09/02/22   Coralyn Helling, MD  apixaban (ELIQUIS) 2.5 MG TABS tablet Take 1 tablet (2.5 mg total) by mouth 2 (two) times daily. 09/19/23 10/19/23  Carollee Herter, DO  bumetanide (BUMEX) 2 MG tablet Please take 2mg  daily in the morning and then 2mg  in the afternoon on MONDAY, [facility] AND FRIDAY 10/12/22   Alver Sorrow, NP  cholecalciferol (VITAMIN D3) 25 MCG (1000 UNIT) tablet Take 1,000 Units by mouth daily.    [provider]  diazepam (VALIUM) 5 MG tablet Take 1 tablet (5 mg total) by mouth at bedtime. 05/05/23   Burchette, Elberta Fortis, MD  estradiol (ESTRACE VAGINAL) 0.1 MG/GM  vaginal cream USE 2 grams per vagina daily for 2 weeks and then 1 gram per vagina three times per week. Patient taking differently: USE 1 gram per vagina three times per week PRN 07/27/22   Burchette, Elberta Fortis, MD  fluticasone (FLONASE) 50 MCG/ACT nasal spray Place 1 spray into both nostrils daily as needed for allergies or rhinitis. 08/30/22   Coralyn Helling, MD  ipratropium-albuterol (DUONEB) 0.5-2.5 (3) MG/3ML SOLN Take 3 mLs by nebulization every 4 (four) hours as needed. 09/13/23 10/13/23  Carollee Herter, DO  metolazone (ZAROXOLYN) 5 MG tablet TAKE ONE TABLET AS NEEDED ONCE A WEEK 30 MINUTES BEFORE BUMEX DOSE. AS NEEDED ONLY FOR WEIGHT GAIN 2LBS OVERNIGHT OR 5 LBS IN ONE WEEK 10/11/22   Alver Sorrow, NP  Multiple Vitamins-Minerals (HAIR/SKIN/NAILS/BIOTIN PO) Take 1 tablet by mouth daily.    [provider]  Multiple Vitamins-Minerals (MULTIVITAMIN ADULT EXTRA C) CHEW Chew 2 tablets by mouth daily.    [provider]  potassium chloride (KLOR-CON) 10 MEQ tablet TAKE UP TO 8 TABLETS DAILY AS DIRECTED Patient taking differently: Take 80 mEq by mouth daily. 07/15/23   Chilton Si, MD  sacubitril-valsartan (ENTRESTO) 24-26 MG Take 1 tablet by mouth 2 (two) times daily. 07/27/23   Chilton Si, MD     Allergies:    Allergies  Allergen Reactions   Aldactone [Spironolactone] Other (See Comments)    dyspnea   Amoxicillin Palpitations    Tachycardia Has patient had a PCN reaction causing immediate rash, facial/tongue/throat swelling, SOB or lightheadedness with hypotension: no Has patient had a PCN reaction causing severe rash involving mucus membranes or skin necrosis: {no Has patient had a PCN reaction that required hospitalization {no Has patient had a PCN reaction occurring within the last 10 years: {yes If all of the above answers are "NO", then may proceed with Cephalosporin use.   Diltiazem Other (See Comments)    Causing headaches    Flagyl [Metronidazole Hcl]  Other (See Comments)    Causing headaches     Flovent [Fluticasone Propionate] Other (See Comments)    Leg cramps   Gabapentin Swelling   Lyrica [Pregabalin] Swelling   Quinidine Diarrhea and Other (See Comments)    Fever diarrhea   Simvastatin Other (See Comments)    Leg pain, myalgia   Tramadol Nausea Only   Verapamil Other (See Comments)    myalgias   Amlodipine     Low extremity edema   Crestor [Rosuvastatin]     myalgia   Livalo [Pitavastatin]     myalgias  Polymyxin B-Trimethoprim Swelling   Zetia [Ezetimibe]     LEG CRAMPS    Benazepril Hcl Cough   Ciprofloxacin Diarrhea   Codeine Nausea Only   Nitrofurantoin Monohyd Macro Nausea Only    Social History:   Social History   Socioeconomic History   Marital status: Married    Spouse name: Not on file   Number of children: Not on file   Years of education: Not on file   Highest education level: Not on file  Occupational History   Not on file  Tobacco Use   Smoking status: Former    Current packs/day: 0.00    Average packs/day: 1 pack/day for 15.0 years (15.0 ttl pk-yrs)    Types: Cigarettes    Start date: 09/27/1960    Quit date: 09/28/1975    Years since quitting: 48.0   Smokeless tobacco: Never  Vaping Use   Vaping status: Never Used  Substance and Sexual Activity   Alcohol use: No    Alcohol/week: 0.0 standard drinks of alcohol   Drug use: No   Sexual activity: Yes  Other Topics Concern   Not on file  Social History Narrative   Not on file   Social Drivers of Health   Financial Resource Strain: Low Risk  (04/09/2022)   Overall Financial Resource Strain (CARDIA)    Difficulty of Paying Living Expenses: Not hard at all  Food Insecurity: No Food Insecurity (09/14/2023)   Hunger Vital Sign    Worried About Running Out of Food in the Last Year: Never true    Ran Out of Food in the Last Year: Never true  Transportation Needs: No Transportation Needs (09/14/2023)   PRAPARE - Doctor, general practice (Medical): No    Lack of Transportation (Non-Medical): No  Physical Activity: Insufficiently Active (04/09/2022)   Exercise Vital Sign    Days of Exercise per Week: 5 days    Minutes of Exercise per Session: 20 min  Stress: No Stress Concern Present (04/09/2022)   [employer] of Occupational Health - Occupational Stress Questionnaire    Feeling of Stress : Not at all  Social Connections: Socially Integrated (04/09/2022)   Social Connection and Isolation Panel [NHANES]    Frequency of Communication with Friends and Family: More than three times a week    Frequency of Social Gatherings with Friends and Family: More than three times a week    Attends Religious Services: More than 4 times per year    Active Member of [employer] or Organizations: Yes    Attends Banker Meetings: More than 4 times per year    Marital Status: Married  Catering manager Violence: Not At Risk (09/14/2023)   Humiliation, Afraid, Rape, and Kick questionnaire    Fear of Current or Ex-Partner: No    Emotionally Abused: No    Physically Abused: No    Sexually Abused: No    Family History:  The patient's family history includes Leukemia in her father. There is no history of Breast cancer.    ROS:  Please see the history of present illness. All other ROS reviewed and negative.     Physical Exam/Data:   Vitals:   09/21/23 0500 09/21/23 0550 09/21/23 0630 09/21/23 0705  BP: (!) 157/58 (!) 163/65 130/64 (!) 153/51  Pulse: (!) 48  83 (!) 48  Resp: 17 19 10 14   Temp:  98.4 F (36.9 C)    TempSrc:  Oral    SpO2: 97%  98% 94%  Weight:      Height:       No intake or output data in the 24 hours ending 09/21/23 0750    09/21/2023    2:32 AM 09/20/2023   10:57 AM 09/20/2023    8:43 AM  Last 3 Weights  Weight (lbs) 128 lb 128 lb 12.8 oz 128 lb 12.8 oz  Weight (kg) 58.06 kg 58.423 kg 58.423 kg     Body mass index is 22.67 kg/m.  General:  Well nourished, well developed, in no  acute distress HEENT: normal Neck: + JVD Vascular: No carotid bruits; Distal pulses 2+ bilaterally   Cardiac:  normal S1, S2; RRR; 2/6 murmur.  Lungs:  clear to auscultation bilaterally, no wheezing, rhonchi or rales  Abd: soft, nontender, no hepatomegaly  Ext: no edema Musculoskeletal:  No deformities, BUE and BLE strength normal and equal Skin: warm and dry  Neuro:  CNs 2-12 intact, no focal abnormalities noted Psych:  Normal affect    EKG: Sinus rhythm, heart rate 91, first-degree AV block.  Sinus pauses 2 to 3 seconds.  QTc 587.  Relevant CV Studies: Echocardiogram 11/03/2022 1. Left ventricular ejection fraction, by estimation, is 60 to 65%. The  left ventricle has normal function. The left ventricle has no regional  wall motion abnormalities. Left ventricular diastolic function could not  be evaluated.   2. Right ventricular systolic function is moderately reduced. The right  ventricular size is mildly enlarged. There is mildly elevated pulmonary  artery systolic pressure.   3. Left atrial size was severely dilated.   4. Right atrial size was moderately dilated.   5. The mitral valve has been repaired/replaced. No evidence of mitral  valve regurgitation. The mean mitral valve gradient is 4.0 mmHg. There is  a 29 mm Medtronic bioprosthetic valve present in the mitral position. Echo  findings are consistent with  normal structure and function of the mitral valve prosthesis.   6. Tricuspid valve regurgitation is moderate.   7. The aortic valve is tricuspid. There is moderate calcification of the  aortic valve. There is moderate thickening of the aortic valve. Aortic  valve regurgitation is not visualized. Mild aortic valve stenosis. Aortic  valve area, by VTI measures 2.20  cm. Aortic valve mean gradient measures 11.0 mmHg. Aortic valve Vmax  measures 2.23 m/s.   8. The inferior vena cava is dilated in size with >50% respiratory  variability, suggesting right atrial pressure  of 8 mmHg.   9. Cannot exclude a small PFO.   Comparison(s): No significant change from prior study.   Conclusion(s)/Recommendation(s): Bradycardic into the 40s during study.   Laboratory Data:  High Sensitivity Troponin:   Recent Labs  Lab 09/10/23 2018 09/10/23 2325 09/21/23 0247 09/21/23 0452  TROPONINIHS 30* 34* 33* 34*      Chemistry Recent Labs  Lab 09/20/23 0954 09/21/23 0247  NA 137 140  K 4.2 4.0  CL 108 107  CO2 25 28  GLUCOSE 102* 100*  BUN 17 15  CREATININE 0.79 0.76  CALCIUM 8.8* 9.0  GFRNONAA >60 >60  ANIONGAP 4* 5    No results for input(s): "PROT", "ALBUMIN", "AST", "ALT", "ALKPHOS", "BILITOT" in the last 168 hours. Lipids No results for input(s): "CHOL", "TRIG", "HDL", "LABVLDL", "LDLCALC", "CHOLHDL" in the last 168 hours. Hematology Recent Labs  Lab 09/20/23 0954 09/21/23 0247  WBC 8.2 5.7  RBC 3.80* 3.70*  HGB 11.6* 11.3*  HCT 34.5* 33.0*  MCV 90.8 89.2  MCH 30.5  30.5  MCHC 33.6 34.2  RDW 12.9 13.0  PLT 178 179   Thyroid No results for input(s): "TSH", "FREET4" in the last 168 hours. BNPNo results for input(s): "BNP", "PROBNP" in the last 168 hours.  DDimer No results for input(s): "DDIMER" in the last 168 hours.   Radiology/Studies:  DG Chest Port 1 View Result Date: 09/21/2023 CLINICAL DATA:  Shortness of breath EXAM: PORTABLE CHEST 1 VIEW COMPARISON:  09/13/2023 FINDINGS: Prior CABG. Cardiomegaly, vascular congestion. Small bilateral pleural effusions with bibasilar opacities, likely atelectasis. No overt edema. No acute bony abnormality. IMPRESSION: Cardiomegaly, vascular congestion. Small bilateral pleural effusions with bibasilar opacities, likely atelectasis. Electronically Signed   By: Charlett Nose M.D.   On: 09/21/2023 03:14     Assessment and Plan:   Sinus node dysfunction Has had chronic sinus pauses and bradycardia however now appear to be longer in duration and occurring more frequently.  Fortunately she is mildly  symptomatic however has had some presyncopal moments, but no falls.  Initial plan was for PPM on 09/23/2023 however had one 6.9 and one 6.3-second pause while sleeping and admitted.  Will discuss with EP about planning of PPM, seems to be stable now will defer to him whether this can be done today or not. Confirms no metoprolol or Eliquis (last dose 12/24) External pacing by the bedside along with atropine. Will check TSH, normal 1 year ago.  Prolonged QTc 587 Likely inaccurate however will ask EP to weigh in and comment  PAF status post Maze 2018 First-degree AV block Maintaining sinus rhythm here, recently transition from warfarin to Eliquis with known bioprosthetic valve with issues maintaining therapeutic INRs.  Hold Eliquis with anticipation of procedure.  Valvular disease Mean mitral valve gradient 4,  Stable on echocardiogram in February 2024.  Moderate TR.  Mild aortic stenosis, mean gradient 11.  Continue to monitor outpatient.  Chronic HFpEF EF 60 to 65%, moderately reduced RV function. Maybe secondary to COPD or possible OSA.   Euvolemic, has been doing well off her diuretics.  Continue to hold. Okay to continue Entresto 24-26mg  BID.   Reports frequent UTIs, no SGLT2 inhibitor  CAD status post single-vessel CABG 2018 No anginal complaint, stable.  Has reported multiple medication intolerances (Rosuvastatin, Simvastatin, Pitavastatin).  Per Dr. Duke Salvia working on starting on [place].   Sleep disorder Chronically has been on Valium and pain meds, she is extremely high risk for adverse side effects.  Recommend discussing with PCP other alternatives.  Discussed with EP not likely contributory. Will reluctantly continue here since she's likely developed some dependency on this.    Risk Assessment/Risk Scores:    New York Heart Association (NYHA) Functional Class NYHA Class II  CHA2DS2-VASc Score = 6   This indicates a 9.7% annual risk of stroke. The patient's score is based  upon: CHF History: 1 HTN History: 1 Diabetes History: 0 Stroke History: 0 Vascular Disease History: 1 Age Score: 2 Gender Score: 1   Code Status: Partial Code: NO Chest Compressions NO Mechanical Ventilation NO Intubation  Severity of Illness: The appropriate patient status for this patient is INPATIENT. Inpatient status is judged to be reasonable and necessary in order to provide the required intensity of service to ensure the patient's safety. The patient's presenting symptoms, physical exam findings, and initial radiographic and laboratory data in the context of their chronic comorbidities is felt to place them at high risk for further clinical deterioration. Furthermore, it is not anticipated that the patient will be medically stable  for discharge from the hospital within 2 midnights of admission.   * I certify that at the point of admission it is my clinical judgment that the patient will require inpatient hospital care spanning beyond 2 midnights from the point of admission due to high intensity of service, high risk for further deterioration and high frequency of surveillance required.*   For questions or updates, please contact [facility] Please consult [email or web address] for contact info under     Signed, Abagail Kitchens, PA-C  09/21/2023 7:50 AM    EP Attending  Please see my consult note as well. We will plan to proceed with PPM insertion once her eliquis has had a chance to wash out.   Sharlot Gowda Kamirah Shugrue,MD

## 2023-09-21 NOTE — ED Notes (Signed)
Writer spoke with representative from [employer] who told Clinical research associate that patient was told to come in d/t having three separate 6-second pauses while in a sinus/junctional rhythm. Pt has also had one 6-second pause in cardiac rhythm since arriving in ED. Delo, MD made aware.

## 2023-09-21 NOTE — Consult Note (Signed)
Cardiology Consultation   Patient ID: EDWYNA KAFKA MRN: [id number]; DOB: [birth year]  Admit date: 09/21/2023 Date of Consult: 09/21/2023  PCP:  Catherine Covey, MD   [facility] Providers Cardiologist:  Chilton Si, MD        Patient Profile:   Catherine [name] is a 88 y.o. female with a hx of mitral valve stenosis, s/p replacement who is being seen 09/21/2023 for the evaluation of prolonged pauses at the request of Dr. Regino Schultze.  History of Present Illness:   Catherine [name] has an extensive cardiac history including mitral valve stenosis, CAD, diastolic heart failure, and recent Covid infection and spontaneous PTX. She had been placed on a Zio monitor and I saw her in the office urgently due to pauses of over 4 seconds occurring during the daytime. The patient was minimally symptomatic. She had nocturnal pauses of over 6 seconds and was summoned to the ED. She feels well. She took her last dose of eliquis last night.  She is on no AV or sinus node slowing meds. She has long first degree AV block.    Past Medical History:  Diagnosis Date   Acute on chronic diastolic heart failure (HCC)    AKI (acute kidney injury) (HCC) 06/22/2022   Aortic stenosis, mild 07/26/2017   Arthritis    Asthma    last attack 02/2015   Atrial fibrillation, chronic (HCC)    Cellulitis of left lower extremity 08/17/2016   Ulcer associated with severe venous insufficiency   Chronic anticoagulation    Chronic diastolic CHF (congestive heart failure) (HCC)    Chronic kidney disease    "RIGHT MANY KIDNEY INFECTIONS AND STONES"   COPD (chronic obstructive pulmonary disease) (HCC)    Coronary artery disease    Dizziness    H/O: rheumatic fever    Heart murmur    Hypertension    PONV (postoperative nausea and vomiting)    ' SOMETIMES', BUT NOT ALWAYS"   Primary hypertension    RHEUMATIC MITRAL STENOSIS 01/05/2008   Qualifier: Diagnosis of   By: Maple Hudson MD, Clinton D        S/P Maze operation  for atrial fibrillation 10/14/2016   Complete bilateral atrial lesion set using cryothermy and bipolar radiofrequency ablation - atrial appendage was not treated due to previous surgical procedure (open mitral commissurotomy)   S/P mitral valve replacement with bioprosthetic valve 10/14/2016   29 mm Medtronic Mosaic porcine bioprosthetic tissue valve   Sciatica 08/29/2023   Tricuspid regurgitation 07/14/2021   UTI (urinary tract infection)    Valvular heart disease    Has mitral stenosis with prior mitral commissurotomy in 1970    Past Surgical History:  Procedure Laterality Date   ABDOMINAL HYSTERECTOMY  1983   endometriosis   APPENDECTOMY     BACK SURGERY     neurosurgery x2   CARDIAC CATHETERIZATION     CARDIAC CATHETERIZATION N/A 08/03/2016   Procedure: Right/Left Heart Cath and Coronary Angiography;  Surgeon: Peter M Swaziland, MD;  Location: [facility] INVASIVE CV LAB;  Service: Cardiovascular;  Laterality: N/A;   cataract surg     CHOLECYSTECTOMY N/A 05/30/2015   Procedure: LAPAROSCOPIC CHOLECYSTECTOMY WITH INTRAOPERATIVE CHOLANGIOGRAM;  Surgeon: Glenna Fellows, MD;  Location: [facility] OR;  Service: General;  Laterality: N/A;   COLONOSCOPY     CORONARY ARTERY BYPASS GRAFT N/A 10/14/2016   Procedure: CORONARY ARTERY BYPASS GRAFTING (CABG);  Surgeon: Purcell Nails, MD;  Location: [facility] OR;  Service: Open Heart Surgery;  Laterality:  N/A;   EYE SURGERY     MAZE N/A 10/14/2016   Procedure: MAZE;  Surgeon: Purcell Nails, MD;  Location: [facility] OR;  Service: Open Heart Surgery;  Laterality: N/A;   MITRAL VALVE REPLACEMENT N/A 10/14/2016   Procedure: REDO MITRAL VALVE REPLACEMENT (MVR);  Surgeon: Purcell Nails, MD;  Location: [facility] OR;  Service: Open Heart Surgery;  Laterality: N/A;   MITRAL VALVE SURGERY Left 1970   Open mitral commissurotomy via left thoracotomy approach   TEE WITHOUT CARDIOVERSION N/A 07/05/2016   Procedure: TRANSESOPHAGEAL ECHOCARDIOGRAM (TEE);  Surgeon: Chilton Si, MD;   Location: [facility] ENDOSCOPY;  Service: Cardiovascular;  Laterality: N/A;   TEE WITHOUT CARDIOVERSION N/A 10/14/2016   Procedure: TRANSESOPHAGEAL ECHOCARDIOGRAM (TEE);  Surgeon: Purcell Nails, MD;  Location: [facility] OR;  Service: Open Heart Surgery;  Laterality: N/A;     Home Medications:  Prior to Admission medications   Medication Sig Start Date End Date Taking? Authorizing Provider  HYDROcodone-acetaminophen (NORCO/VICODIN) 5-325 MG tablet TAKE ONE TABLET EVERY 8 HOURS AS NEEDED FOR PAIN 08/29/23  Yes Burchette, Elberta Fortis, MD  acetaminophen (TYLENOL) 500 MG tablet Take 1,000 mg by mouth every 6 (six) hours as needed for mild pain.    [provider]  albuterol (VENTOLIN HFA) 108 (90 Base) MCG/ACT inhaler INHALE 2 PUFFS INTO THE LUNGS EVERY 4 HOURS AS NEEDED FOR AHEEZING OR SHORTNESS OF BREATH 09/02/22   Coralyn Helling, MD  apixaban (ELIQUIS) 2.5 MG TABS tablet Take 1 tablet (2.5 mg total) by mouth 2 (two) times daily. 09/19/23 10/19/23  Carollee Herter, DO  bumetanide (BUMEX) 2 MG tablet Please take 2mg  daily in the morning and then 2mg  in the afternoon on [place], [facility] AND FRIDAY 10/12/22   Alver Sorrow, NP  cholecalciferol (VITAMIN D3) 25 MCG (1000 UNIT) tablet Take 1,000 Units by mouth daily.    [provider]  diazepam (VALIUM) 5 MG tablet Take 1 tablet (5 mg total) by mouth at bedtime. 05/05/23   Burchette, Elberta Fortis, MD  estradiol (ESTRACE VAGINAL) 0.1 MG/GM vaginal cream USE 2 grams per vagina daily for 2 weeks and then 1 gram per vagina three times per week. Patient taking differently: USE 1 gram per vagina three times per week PRN 07/27/22   Burchette, Elberta Fortis, MD  fluticasone (FLONASE) 50 MCG/ACT nasal spray Place 1 spray into both nostrils daily as needed for allergies or rhinitis. 08/30/22   Coralyn Helling, MD  ipratropium-albuterol (DUONEB) 0.5-2.5 (3) MG/3ML SOLN Take 3 mLs by nebulization every 4 (four) hours as needed. 09/13/23 10/13/23  Carollee Herter, DO  metolazone (ZAROXOLYN) 5 MG  tablet TAKE ONE TABLET AS NEEDED ONCE A WEEK 30 MINUTES BEFORE BUMEX DOSE. AS NEEDED ONLY FOR WEIGHT GAIN 2LBS OVERNIGHT OR 5 LBS IN ONE WEEK 10/11/22   Alver Sorrow, NP  Multiple Vitamins-Minerals (HAIR/SKIN/NAILS/BIOTIN PO) Take 1 tablet by mouth daily.    [provider]  Multiple Vitamins-Minerals (MULTIVITAMIN ADULT EXTRA C) CHEW Chew 2 tablets by mouth daily.    [provider]  potassium chloride (KLOR-CON) 10 MEQ tablet TAKE UP TO 8 TABLETS DAILY AS DIRECTED Patient taking differently: Take 80 mEq by mouth daily. 07/15/23   Chilton Si, MD  sacubitril-valsartan (ENTRESTO) 24-26 MG Take 1 tablet by mouth 2 (two) times daily. 07/27/23   Chilton Si, MD    Inpatient Medications: Scheduled Meds:  Chlorhexidine Gluconate Cloth  6 each Topical Q0600   diazepam  5 mg Oral QHS   sacubitril-valsartan  1  tablet Oral BID   Continuous Infusions:  PRN Meds: acetaminophen, albuterol, fluticasone, HYDROcodone-acetaminophen, ondansetron (ZOFRAN) IV  Allergies:    Allergies  Allergen Reactions   Aldactone [Spironolactone] Other (See Comments)    dyspnea   Amoxicillin Palpitations    Tachycardia Has patient had a PCN reaction causing immediate rash, facial/tongue/throat swelling, SOB or lightheadedness with hypotension: no Has patient had a PCN reaction causing severe rash involving mucus membranes or skin necrosis: {no Has patient had a PCN reaction that required hospitalization {no Has patient had a PCN reaction occurring within the last 10 years: {yes If all of the above answers are "NO", then may proceed with Cephalosporin use.   Diltiazem Other (See Comments)    Causing headaches    Flagyl [Metronidazole Hcl] Other (See Comments)    Causing headaches     Flovent [Fluticasone Propionate] Other (See Comments)    Leg cramps   Gabapentin Swelling   Lyrica [Pregabalin] Swelling   Quinidine Diarrhea and Other (See Comments)    Fever diarrhea    Simvastatin Other (See Comments)    Leg pain, myalgia   Tramadol Nausea Only   Verapamil Other (See Comments)    myalgias   Amlodipine     Low extremity edema   Crestor [Rosuvastatin]     myalgia   Livalo [Pitavastatin]     myalgias   Polymyxin B-Trimethoprim Swelling   Zetia [Ezetimibe]     LEG CRAMPS    Benazepril Hcl Cough   Ciprofloxacin Diarrhea   Codeine Nausea Only   Nitrofurantoin Monohyd Macro Nausea Only    Social History:   Social History   Socioeconomic History   Marital status: Married    Spouse name: Not on file   Number of children: Not on file   Years of education: Not on file   Highest education level: Not on file  Occupational History   Not on file  Tobacco Use   Smoking status: Former    Current packs/day: 0.00    Average packs/day: 1 pack/day for 15.0 years (15.0 ttl pk-yrs)    Types: Cigarettes    Start date: 09/27/1960    Quit date: 09/28/1975    Years since quitting: 48.0   Smokeless tobacco: Never  Vaping Use   Vaping status: Never Used  Substance and Sexual Activity   Alcohol use: No    Alcohol/week: 0.0 standard drinks of alcohol   Drug use: No   Sexual activity: Yes  Other Topics Concern   Not on file  Social History Narrative   Not on file   Social Drivers of Health   Financial Resource Strain: Low Risk  (04/09/2022)   Overall Financial Resource Strain (CARDIA)    Difficulty of Paying Living Expenses: Not hard at all  Food Insecurity: No Food Insecurity (09/21/2023)   Hunger Vital Sign    Worried About Running Out of Food in the Last Year: Never true    Ran Out of Food in the Last Year: Never true  Transportation Needs: No Transportation Needs (09/21/2023)   PRAPARE - Administrator, Civil Service (Medical): No    Lack of Transportation (Non-Medical): No  Physical Activity: Insufficiently Active (04/09/2022)   Exercise Vital Sign    Days of Exercise per Week: 5 days    Minutes of Exercise per Session: 20 min   Stress: No Stress Concern Present (04/09/2022)   [employer] of Occupational Health - Occupational Stress Questionnaire    Feeling of Stress : Not  at all  Social Connections: Socially Integrated (04/09/2022)   Social Connection and Isolation Panel [NHANES]    Frequency of Communication with Friends and Family: More than three times a week    Frequency of Social Gatherings with Friends and Family: More than three times a week    Attends Religious Services: More than 4 times per year    Active Member of [employer] or Organizations: Yes    Attends Engineer, structural: More than 4 times per year    Marital Status: Married  Catering manager Violence: Not At Risk (09/21/2023)   Humiliation, Afraid, Rape, and Kick questionnaire    Fear of Current or Ex-Partner: No    Emotionally Abused: No    Physically Abused: No    Sexually Abused: No    Family History:    Family History  Problem Relation Age of Onset   Leukemia Father    Breast cancer Neg Hx      ROS:  Please see the history of present illness.   All other ROS reviewed and negative.     Physical Exam/Data:   Vitals:   09/21/23 0833 09/21/23 0845 09/21/23 0900 09/21/23 0915  BP:   (!) 141/67   Pulse:  (!) 51 81 82  Resp:  14 15 16   Temp: 98.5 F (36.9 C)     TempSrc: Oral     SpO2:  97% 97% 96%  Weight:      Height:       No intake or output data in the 24 hours ending 09/21/23 1007    09/21/2023    2:32 AM 09/20/2023   10:57 AM 09/20/2023    8:43 AM  Last 3 Weights  Weight (lbs) 128 lb 128 lb 12.8 oz 128 lb 12.8 oz  Weight (kg) 58.06 kg 58.423 kg 58.423 kg     Body mass index is 22.67 kg/m.  General:  Well nourished, well developed, in no acute distress HEENT: normal Neck: no JVD Vascular: No carotid bruits; Distal pulses 2+ bilaterally Cardiac:  normal S1, S2; sinus brady; no murmur  Lungs:  clear to auscultation bilaterally, no wheezing, rhonchi or rales  Abd: soft, nontender, no hepatomegaly   Ext: no edema Musculoskeletal:  No deformities, BUE and BLE strength normal and equal Skin: warm and dry  Neuro:  CNs 2-12 intact, no focal abnormalities noted Psych:  Normal affect   EKG:  The EKG was personally reviewed and demonstrates:  sinus brady with long first degree AV block and AV WB Telemetry:  Telemetry was personally reviewed and demonstrates:  sinus brady and NSR with ectopy.  Relevant CV Studies: none  Laboratory Data:  High Sensitivity Troponin:   Recent Labs  Lab 09/10/23 2018 09/10/23 2325 09/21/23 0247 09/21/23 0452  TROPONINIHS 30* 34* 33* 34*     Chemistry Recent Labs  Lab 09/20/23 0954 09/21/23 0247  NA 137 140  K 4.2 4.0  CL 108 107  CO2 25 28  GLUCOSE 102* 100*  BUN 17 15  CREATININE 0.79 0.76  CALCIUM 8.8* 9.0  GFRNONAA >60 >60  ANIONGAP 4* 5    No results for input(s): "PROT", "ALBUMIN", "AST", "ALT", "ALKPHOS", "BILITOT" in the last 168 hours. Lipids No results for input(s): "CHOL", "TRIG", "HDL", "LABVLDL", "LDLCALC", "CHOLHDL" in the last 168 hours.  Hematology Recent Labs  Lab 09/20/23 0954 09/21/23 0247  WBC 8.2 5.7  RBC 3.80* 3.70*  HGB 11.6* 11.3*  HCT 34.5* 33.0*  MCV 90.8 89.2  MCH 30.5  30.5  MCHC 33.6 34.2  RDW 12.9 13.0  PLT 178 179   Thyroid No results for input(s): "TSH", "FREET4" in the last 168 hours.  BNPNo results for input(s): "BNP", "PROBNP" in the last 168 hours.  DDimer No results for input(s): "DDIMER" in the last 168 hours.   Radiology/Studies:  DG Chest Port 1 View Result Date: 09/21/2023 CLINICAL DATA:  Shortness of breath EXAM: PORTABLE CHEST 1 VIEW COMPARISON:  09/13/2023 FINDINGS: Prior CABG. Cardiomegaly, vascular congestion. Small bilateral pleural effusions with bibasilar opacities, likely atelectasis. No overt edema. No acute bony abnormality. IMPRESSION: Cardiomegaly, vascular congestion. Small bilateral pleural effusions with bibasilar opacities, likely atelectasis. Electronically Signed    By: Charlett Nose M.D.   On: 09/21/2023 03:14     Assessment and Plan:   AV block/sinus node dysfunction - I have reviewed the indications for PPM. We will plan to place her PPM either tomorrow afternoon or Friday morning, pending washout of her eliquis and the schedule. Left bundle area pacing is indicated as she will pace in the ventricle essentially 100% due to her heart block. Chronic diastolic heart failure - she appears euvolemic. Continue current meds. Coags - her eliquis has been held. Restart a couple of days after her PPM.    Risk Assessment/Risk Scores:      For questions or updates, please contact [facility] Please consult [email or web address] for contact info under    Signed, Lewayne Bunting, MD  09/21/2023 10:07 AM

## 2023-09-22 ENCOUNTER — Ambulatory Visit (HOSPITAL_COMMUNITY): Admission: RE | Admit: 2023-09-22 | Payer: Medicare Other | Source: Home / Self Care | Admitting: Cardiology

## 2023-09-22 ENCOUNTER — Encounter (HOSPITAL_COMMUNITY)
Admission: EM | Disposition: A | Discharge status: Home / Self Care | Payer: Self-pay | Source: Home / Self Care | Attending: Internal Medicine

## 2023-09-22 DIAGNOSIS — I495 Sick sinus syndrome: Secondary | ICD-10-CM | POA: Diagnosis not present

## 2023-09-22 HISTORY — PX: PACEMAKER IMPLANT: EP1218

## 2023-09-22 LAB — CBC
HCT: 31.3 % — ABNORMAL LOW (ref 36.0–46.0)
Hemoglobin: 10.6 g/dL — ABNORMAL LOW (ref 12.0–15.0)
MCH: 30.3 pg (ref 26.0–34.0)
MCHC: 33.9 g/dL (ref 30.0–36.0)
MCV: 89.4 fL (ref 80.0–100.0)
Platelets: 164 10*3/uL (ref 150–400)
RBC: 3.5 MIL/uL — ABNORMAL LOW (ref 3.87–5.11)
RDW: 12.9 % (ref 11.5–15.5)
WBC: 7.9 10*3/uL (ref 4.0–10.5)
nRBC: 0 % (ref 0.0–0.2)

## 2023-09-22 LAB — BASIC METABOLIC PANEL
Anion gap: 7 (ref 5–15)
BUN: 10 mg/dL (ref 8–23)
CO2: 24 mmol/L (ref 22–32)
Calcium: 8.7 mg/dL — ABNORMAL LOW (ref 8.9–10.3)
Chloride: 110 mmol/L (ref 98–111)
Creatinine, Ser: 0.71 mg/dL (ref 0.44–1.00)
GFR, Estimated: >60 mL/min (ref >60)
Glucose, Bld: 101 mg/dL — ABNORMAL HIGH (ref 70–99)
Potassium: 3.5 mmol/L (ref 3.5–5.1)
Sodium: 141 mmol/L (ref 135–145)

## 2023-09-22 LAB — SURGICAL PCR SCREEN
MRSA, PCR: NEGATIVE
Staphylococcus aureus: POSITIVE — AB

## 2023-09-22 SURGERY — PACEMAKER IMPLANT
Anesthesia: LOCAL

## 2023-09-22 MED ORDER — CEFAZOLIN SODIUM-DEXTROSE 2-4 GM/100ML-% IV SOLN
2.0000 g | INTRAVENOUS | Status: AC
  Filled 2023-09-22: qty 100

## 2023-09-22 MED ORDER — POTASSIUM CHLORIDE CRYS ER 20 MEQ PO TBCR
20.0000 meq | EXTENDED_RELEASE_TABLET | Freq: Every day | ORAL | Status: DC
Start: 2023-09-22 — End: 2023-09-23
  Administered 2023-09-22 – 2023-09-23 (×2): 20 meq via ORAL
  Filled 2023-09-22 (×2): qty 1

## 2023-09-22 MED ORDER — LIDOCAINE HCL (PF) 1 % IJ SOLN
INTRAMUSCULAR | Status: DC | PRN
  Administered 2023-09-22: 60 mL

## 2023-09-22 MED ORDER — FENTANYL CITRATE (PF) 100 MCG/2ML IJ SOLN
INTRAMUSCULAR | Status: AC
  Filled 2023-09-22: qty 2

## 2023-09-22 MED ORDER — CEFAZOLIN SODIUM-DEXTROSE 2-4 GM/100ML-% IV SOLN
INTRAVENOUS | Status: AC
  Administered 2023-09-22: 2 g via INTRAVENOUS
  Filled 2023-09-22: qty 100

## 2023-09-22 MED ORDER — MIDAZOLAM HCL 5 MG/5ML IJ SOLN
INTRAMUSCULAR | Status: AC
Start: 2023-09-22 — End: ?
  Filled 2023-09-22: qty 5

## 2023-09-22 MED ORDER — HEPARIN (PORCINE) IN NACL 1000-0.9 UT/500ML-% IV SOLN
INTRAVENOUS | Status: DC | PRN
  Administered 2023-09-22: 500 mL

## 2023-09-22 MED ORDER — CHLORHEXIDINE GLUCONATE 4 % EX SOLN
60.0000 mL | Freq: Once | CUTANEOUS | Status: AC
  Administered 2023-09-22: 4 via TOPICAL

## 2023-09-22 MED ORDER — FENTANYL CITRATE (PF) 100 MCG/2ML IJ SOLN
INTRAMUSCULAR | Status: DC | PRN
  Administered 2023-09-22 (×2): 25 ug via INTRAVENOUS
  Administered 2023-09-22: 50 ug via INTRAVENOUS

## 2023-09-22 MED ORDER — LIDOCAINE HCL (PF) 1 % IJ SOLN
INTRAMUSCULAR | Status: AC
  Filled 2023-09-22: qty 60

## 2023-09-22 MED ORDER — MORPHINE SULFATE (PF) 2 MG/ML IV SOLN
1.0000 mg | INTRAVENOUS | Status: DC | PRN
  Administered 2023-09-22: 1 mg via INTRAVENOUS
  Filled 2023-09-22: qty 1

## 2023-09-22 MED ORDER — CHLORHEXIDINE GLUCONATE 4 % EX SOLN
60.0000 mL | Freq: Once | CUTANEOUS | Status: AC
  Administered 2023-09-22: 4 via TOPICAL
  Filled 2023-09-22: qty 60

## 2023-09-22 MED ORDER — SODIUM CHLORIDE 0.9 % IV SOLN
INTRAVENOUS | Status: AC
  Filled 2023-09-22: qty 2

## 2023-09-22 MED ORDER — MIDAZOLAM HCL 5 MG/5ML IJ SOLN
INTRAMUSCULAR | Status: DC | PRN
  Administered 2023-09-22 (×2): .5 mg via INTRAVENOUS

## 2023-09-22 MED ORDER — FENTANYL CITRATE (PF) 100 MCG/2ML IJ SOLN
INTRAMUSCULAR | Status: DC | PRN
  Administered 2023-09-22 (×2): 25 ug via INTRAVENOUS

## 2023-09-22 MED ORDER — SODIUM CHLORIDE 0.9 % IV SOLN: INTRAVENOUS | Status: DC

## 2023-09-22 MED ORDER — SODIUM CHLORIDE 0.9 % IV SOLN
80.0000 mg | INTRAVENOUS | Status: DC
  Administered 2023-09-22: 80 mg
  Filled 2023-09-22: qty 2

## 2023-09-22 MED ORDER — CEFAZOLIN SODIUM-DEXTROSE 1-4 GM/50ML-% IV SOLN
1.0000 g | Freq: Four times a day (QID) | INTRAVENOUS | Status: AC
  Administered 2023-09-22 – 2023-09-23 (×3): 1 g via INTRAVENOUS
  Filled 2023-09-22 (×3): qty 50

## 2023-09-22 MED ORDER — MIDAZOLAM HCL 5 MG/5ML IJ SOLN
INTRAMUSCULAR | Status: DC | PRN
  Administered 2023-09-22 (×3): .5 mg via INTRAVENOUS

## 2023-09-22 SURGICAL SUPPLY — 16 items
CABLE SURGICAL S-101-97-12 (CABLE) ×1 IMPLANT
CATH RIGHTSITE C315HIS02 (CATHETERS) IMPLANT
IPG PACE AZUR XT DR MRI W1DR01 (Pacemaker) IMPLANT
LEAD CAPSURE NOVUS 5076-52CM (Lead) IMPLANT
LEAD PASSIVE MRI 4574-53 (Lead) IMPLANT
LEAD SELECT SECURE 3830 383069 (Lead) IMPLANT
PACE AZURE XT DR MRI W1DR01 (Pacemaker) ×1 IMPLANT
PAD DEFIB RADIO PHYSIO CONN (PAD) ×1 IMPLANT
POUCH AIGIS-R ANTIBACT PPM (Mesh General) ×1 IMPLANT
POUCH AIGIS-R ANTIBACT PPM MED (Mesh General) IMPLANT
SELECT SECURE 3830 383069 (Lead) ×2 IMPLANT
SHEATH 7FR PRELUDE SNAP 13 (SHEATH) IMPLANT
SHEATH PROBE COVER 6X72 (BAG) IMPLANT
SLITTER 6232ADJ (MISCELLANEOUS) IMPLANT
TRAY PACEMAKER INSERTION (PACKS) ×1 IMPLANT
WIRE HI TORQ VERSACORE-J 145CM (WIRE) IMPLANT

## 2023-09-22 NOTE — Progress Notes (Signed)
Copies of living will made and placed in patients' chart

## 2023-09-22 NOTE — Discharge Instructions (Addendum)
After Your Pacemaker   You have a Medtronic Pacemaker  ACTIVITY Do not lift your arm above shoulder height for 1 week after your procedure. After 7 days, you may progress as below.  You should remove your sling 24 hours after your procedure, unless otherwise instructed by your provider.     Thursday September 29, 2023  Friday September 30, 2023 Saturday October 01, 2023 Sunday October 02, 2023   Do not lift, push, pull, or carry anything over 10 pounds with the affected arm until 6 weeks (Thursday November 03, 2023 ) after your procedure.   You may drive AFTER your wound check, unless you have been told otherwise by your provider.   Ask your healthcare provider when you can go back to work   INCISION/Dressing Resume your Eliquis after 5 days (On January 1st, 2025)  If large square, outer bandage is left in place, this can be removed after 24 hours from your procedure. Do not remove steri-strips or glue as below.   If a PRESSURE DRESSING (a bulky dressing that usually goes up over your shoulder) was applied or left in place, please follow instructions given by your provider on when to return to have this removed.   Monitor your Pacemaker site for redness, swelling, and drainage. Call the device clinic at [phone] if you experience these symptoms or fever/chills.  If your incision is sealed with Steri-strips or staples, you may shower 7 days after your procedure or when told by your provider. Do not remove the steri-strips or let the shower hit directly on your site. You may wash around your site with soap and water.    If you were discharged in a sling, please do not wear this during the day more than 48 hours after your surgery unless otherwise instructed. This may increase the risk of stiffness and soreness in your shoulder.   Avoid lotions, ointments, or perfumes over your incision until it is well-healed.  You may use a hot tub or a pool AFTER your wound check appointment if the  incision is completely closed.  Pacemaker Alerts:  Some alerts are vibratory and others beep. These are NOT emergencies. Please call our office to let us know. If this occurs at night or on weekends, it can wait until the next business day. Send a remote transmission.  If your device is capable of reading fluid status (for heart failure), you will be offered monthly monitoring to review this with you.   DEVICE MANAGEMENT Remote monitoring is used to monitor your pacemaker from home. This monitoring is scheduled every 91 days by our office. It allows [name] to keep an eye on the functioning of your device to ensure it is working properly. You will routinely see your Electrophysiologist annually (more often if necessary).   You should receive your ID card for your new device in 4-8 weeks. Keep this card with you at all times once received. Consider wearing a medical alert bracelet or necklace.  Your Pacemaker may be MRI compatible. This will be discussed at your next office visit/wound check.  You should avoid contact with strong electric or magnetic fields.   Do not use amateur (ham) radio equipment or electric (arc) welding torches. MP3 player headphones with magnets should not be used. Some devices are safe to use if held at least 12 inches (30 cm) from your Pacemaker. These include power tools, lawn mowers, and speakers. If you are unsure if something is safe to use, ask your health  care provider.  When using your cell phone, hold it to the ear that is on the opposite side from the Pacemaker. Do not leave your cell phone in a pocket over the Pacemaker.  You may safely use electric blankets, heating pads, computers, and microwave ovens.  Call the office right away if: You have chest pain. You feel more short of breath than you have felt before. You feel more light-headed than you have felt before. Your incision starts to open up.  This information is not intended to replace advice given to you  by your health care provider. Make sure you discuss any questions you have with your health care provider.

## 2023-09-22 NOTE — Interval H&P Note (Signed)
History and Physical Interval Note:  09/22/2023 4:31 PM  Catherine [name]  has presented today for surgery, with the diagnosis of Sinus node dysfunction.  The various methods of treatment have been discussed with the patient and family. After consideration of risks, benefits and other options for treatment, the patient has consented to  Procedure(s): PACEMAKER IMPLANT (N/A) as a surgical intervention.  The patient's history has been reviewed, patient examined, no change in status, stable for surgery.  I have reviewed the patient's chart and labs.  Questions were answered to the patient's satisfaction.     Catherine [name]

## 2023-09-22 NOTE — TOC CM/SW Note (Signed)
Transition of Care [facility]) - Inpatient Brief Assessment   Patient Details  Name: Catherine [name] MRN: [id number] Date of Birth: 01-Jun-1935  Transition of Care [facility]) CM/SW Contact:    Catherine Masson, RN Phone Number: 09/22/2023, 1:17 PM   Clinical Narrative: Patient schedule for pacemaker implant today. No TOC needs at this time.   Transition of Care Asessment: Insurance and Status: Insurance coverage has been reviewed Patient has primary care physician: Yes Home environment has been reviewed: safe to discharge home Prior level of function:: independent Prior/Current Home Services: No current home services Social Drivers of Health Review: SDOH reviewed no interventions necessary Readmission risk has been reviewed: Yes Transition of care needs: no transition of care needs at this time

## 2023-09-22 NOTE — Plan of Care (Signed)
  Problem: Respiratory: Goal: Will maintain a patent airway Outcome: Progressing   Problem: Education: Goal: Knowledge of General Education information will improve Description: Including pain rating scale, medication(s)/side effects and non-pharmacologic comfort measures Outcome: Progressing   Problem: Clinical Measurements: Goal: Will remain free from infection Outcome: Progressing   Problem: Activity: Goal: Risk for activity intolerance will decrease Outcome: Progressing   Problem: Elimination: Goal: Will not experience complications related to urinary retention Outcome: Progressing   Problem: Safety: Goal: Ability to remain free from injury will improve Outcome: Progressing

## 2023-09-22 NOTE — H&P (View-Only) (Signed)
  Patient Name: Catherine [name] Date of Encounter: 09/22/2023  Primary Cardiologist: Chilton Si, MD Electrophysiologist: None  Interval Summary   The patient is doing well today, anxious about pacemaker implant. She denies chest pain, shortness of breath, dizziness or lightheadedness.  She was asymptomatic and sleeping during overnight pauses  Vital Signs    Vitals:   09/21/23 1932 09/21/23 2339 09/22/23 0510 09/22/23 0756  BP: (!) 126/57 (!) 132/55 (!) 147/67 (!) 164/54  Pulse: (!) 52 (!) 54 85   Resp: 20 20 20    Temp: 98.5 F (36.9 C) 97.6 F (36.4 C) 98.1 F (36.7 C) 97.9 F (36.6 C)  TempSrc: Oral Oral Oral Oral  SpO2: 95% 95%    Weight:   57.3 kg   Height:        Intake/Output Summary (Last 24 hours) at 09/22/2023 0800 Last data filed at 09/22/2023 0510 Gross per 24 hour  Intake 240 ml  Output 400 ml  Net -160 ml   Filed Weights   09/21/23 0232 09/22/23 0510  Weight: 58.1 kg 57.3 kg    Physical Exam    GEN- The patient is well appearing, alert and oriented x 3 today.   Lungs- Clear to ausculation bilaterally, normal work of breathing Cardiac- Regular, bradycardic rate and rhythm, no murmurs, rubs or gallops GI- soft, NT, ND, + BS Extremities- no clubbing or cyanosis. No edema  Telemetry    Sinus brady w 4 second pauses overnight (personally reviewed)  Hospital Course    Kaela ZELINA PAOLETTI is a 88 y.o. female with PMH ov mitral valve stenosis s/p replacement, CAD s/p CABG, parox AFib, COPD, asthma, CKD, HTN, HLD, diastolic heart failure, recent covid infx with spontaneous PTX admitted for sinus bradycardia with prolonged pauses.   Assessment & Plan    #) AV block/sinus node dysfunction  Discussed rationale for pacemaker Reviewed risks/ benefits of procedure. Patient verbalized understanding and wished to proceed Clear liquid diet until 9am, then NPO for procedure this afternoon   #) Chronic diastolic heart failure Euvolemic on exam Continue  home meds - 24/26 entresto BID   #) Parox AFib Remains in sinus rhythm at this time Home eliquis held, last dose 12/24 PM      For questions or updates, please contact [facility] Please consult [email or web address] for contact info under Cardiology/STEMI.  Signed, Sherie Don, NP  09/22/2023, 8:00 AM   I have seen and examined this patient with Sherie Don.  Agree with above, note added to reflect my findings.  Patient feeling well.  No acute complaints.  Ready for pacemaker implant.  GEN: Well nourished, well developed, in no acute distress  HEENT: normal  Neck: no JVD, carotid bruits, or masses Cardiac: RRR; no murmurs, rubs, or gallops,no edema  Respiratory:  clear to auscultation bilaterally, normal work of breathing GI: soft, nontender, nondistended, + BS MS: no deformity or atrophy  Skin: warm and dry, Neuro:  Strength and sensation are intact Psych: euthymic mood, full affect   AV block/sinus node dysfunction: Delano Frate plan for pacemaker implant.  No reversible causes.  Risk and benefits have been discussed.  Risk of bleeding, tamponade, infection, pneumothorax, lead dislodgment, MI, worsening renal function, CVA, death.  She understands these risks and is agreed to the procedure. Chronic diastolic heart failure: Appears euvolemic Paroxysmal atrial fibrillation: Holding anticoagulation for pacemaker implant.  Conn Trombetta M. Gracynn Rajewski MD 09/22/2023 4:14 PM

## 2023-09-22 NOTE — Progress Notes (Addendum)
  Patient Name: Catherine [name] Date of Encounter: 09/22/2023  Primary Cardiologist: Chilton Si, MD Electrophysiologist: None  Interval Summary   The patient is doing well today, anxious about pacemaker implant. She denies chest pain, shortness of breath, dizziness or lightheadedness.  She was asymptomatic and sleeping during overnight pauses  Vital Signs    Vitals:   09/21/23 1932 09/21/23 2339 09/22/23 0510 09/22/23 0756  BP: (!) 126/57 (!) 132/55 (!) 147/67 (!) 164/54  Pulse: (!) 52 (!) 54 85   Resp: 20 20 20    Temp: 98.5 F (36.9 C) 97.6 F (36.4 C) 98.1 F (36.7 C) 97.9 F (36.6 C)  TempSrc: Oral Oral Oral Oral  SpO2: 95% 95%    Weight:   57.3 kg   Height:        Intake/Output Summary (Last 24 hours) at 09/22/2023 0800 Last data filed at 09/22/2023 0510 Gross per 24 hour  Intake 240 ml  Output 400 ml  Net -160 ml   Filed Weights   09/21/23 0232 09/22/23 0510  Weight: 58.1 kg 57.3 kg    Physical Exam    GEN- The patient is well appearing, alert and oriented x 3 today.   Lungs- Clear to ausculation bilaterally, normal work of breathing Cardiac- Regular, bradycardic rate and rhythm, no murmurs, rubs or gallops GI- soft, NT, ND, + BS Extremities- no clubbing or cyanosis. No edema  Telemetry    Sinus brady w 4 second pauses overnight (personally reviewed)  Hospital Course    Kaela ZELINA PAOLETTI is a 88 y.o. female with PMH ov mitral valve stenosis s/p replacement, CAD s/p CABG, parox AFib, COPD, asthma, CKD, HTN, HLD, diastolic heart failure, recent covid infx with spontaneous PTX admitted for sinus bradycardia with prolonged pauses.   Assessment & Plan    #) AV block/sinus node dysfunction  Discussed rationale for pacemaker Reviewed risks/ benefits of procedure. Patient verbalized understanding and wished to proceed Clear liquid diet until 9am, then NPO for procedure this afternoon   #) Chronic diastolic heart failure Euvolemic on exam Continue  home meds - 24/26 entresto BID   #) Parox AFib Remains in sinus rhythm at this time Home eliquis held, last dose 12/24 PM      For questions or updates, please contact [facility] Please consult [email or web address] for contact info under Cardiology/STEMI.  Signed, Sherie Don, NP  09/22/2023, 8:00 AM   I have seen and examined this patient with Sherie Don.  Agree with above, note added to reflect my findings.  Patient feeling well.  No acute complaints.  Ready for pacemaker implant.  GEN: Well nourished, well developed, in no acute distress  HEENT: normal  Neck: no JVD, carotid bruits, or masses Cardiac: RRR; no murmurs, rubs, or gallops,no edema  Respiratory:  clear to auscultation bilaterally, normal work of breathing GI: soft, nontender, nondistended, + BS MS: no deformity or atrophy  Skin: warm and dry, Neuro:  Strength and sensation are intact Psych: euthymic mood, full affect   AV block/sinus node dysfunction: Delano Frate plan for pacemaker implant.  No reversible causes.  Risk and benefits have been discussed.  Risk of bleeding, tamponade, infection, pneumothorax, lead dislodgment, MI, worsening renal function, CVA, death.  She understands these risks and is agreed to the procedure. Chronic diastolic heart failure: Appears euvolemic Paroxysmal atrial fibrillation: Holding anticoagulation for pacemaker implant.  Conn Trombetta M. Gracynn Rajewski MD 09/22/2023 4:14 PM

## 2023-09-23 ENCOUNTER — Inpatient Hospital Stay (HOSPITAL_COMMUNITY): Payer: Medicare Other

## 2023-09-23 ENCOUNTER — Encounter (HOSPITAL_COMMUNITY): Payer: Self-pay | Admitting: Cardiology

## 2023-09-23 ENCOUNTER — Other Ambulatory Visit: Payer: Self-pay

## 2023-09-23 LAB — CBC
HCT: 32.4 % — ABNORMAL LOW (ref 36.0–46.0)
Hemoglobin: 11 g/dL — ABNORMAL LOW (ref 12.0–15.0)
MCH: 30.4 pg (ref 26.0–34.0)
MCHC: 34 g/dL (ref 30.0–36.0)
MCV: 89.5 fL (ref 80.0–100.0)
Platelets: 168 10*3/uL (ref 150–400)
RBC: 3.62 MIL/uL — ABNORMAL LOW (ref 3.87–5.11)
RDW: 13.2 % (ref 11.5–15.5)
WBC: 10.5 10*3/uL (ref 4.0–10.5)
nRBC: 0 % (ref 0.0–0.2)

## 2023-09-23 LAB — BASIC METABOLIC PANEL
Anion gap: 6 (ref 5–15)
BUN: 7 mg/dL — ABNORMAL LOW (ref 8–23)
CO2: 24 mmol/L (ref 22–32)
Calcium: 8.6 mg/dL — ABNORMAL LOW (ref 8.9–10.3)
Chloride: 112 mmol/L — ABNORMAL HIGH (ref 98–111)
Creatinine, Ser: 0.79 mg/dL (ref 0.44–1.00)
GFR, Estimated: >60 mL/min (ref >60)
Glucose, Bld: 102 mg/dL — ABNORMAL HIGH (ref 70–99)
Potassium: 3.6 mmol/L (ref 3.5–5.1)
Sodium: 142 mmol/L (ref 135–145)

## 2023-09-23 MED ORDER — APIXABAN 2.5 MG PO TABS: 2.5000 mg | ORAL_TABLET | Freq: Two times a day (BID) | ORAL | Status: DC

## 2023-09-23 MED ORDER — MUPIROCIN 2 % EX OINT: TOPICAL_OINTMENT | Freq: Two times a day (BID) | CUTANEOUS | Status: DC

## 2023-09-23 NOTE — Discharge Summary (Signed)
ELECTROPHYSIOLOGY PROCEDURE DISCHARGE SUMMARY    Patient ID: Catherine [name],  MRN: [id number], DOB/AGE: 86-Apr-1936 86 y.o.  Admit date: 09/21/2023 Discharge date: 09/23/2023  Primary Care Physician: Kristian Covey, MD  Primary Cardiologist: Chilton Si, MD  Electrophysiologist: Dr. Jimmey Ralph   Primary Discharge Diagnosis:  Symptomatic bradycardia status post pacemaker implantation this admission  Secondary Discharge Diagnosis:  H/o Mitral valve replacement CAD s/p CABG PAF COPD CKD HTN HLD Recent Covid  Allergies  Allergen Reactions   Aldactone [Spironolactone] Other (See Comments)    dyspnea   Amoxicillin Palpitations    Tachycardia   Amlodipine Other (See Comments)    Low extremity edema   Crestor [Rosuvastatin] Other (See Comments)    myalgia   Diltiazem Other (See Comments)    Causing headaches    Flagyl [Metronidazole Hcl] Other (See Comments)    Causing headaches     Flovent [Fluticasone Propionate] Other (See Comments)    Leg cramps   Gabapentin Swelling   Livalo [Pitavastatin] Other (See Comments)    myalgias   Lyrica [Pregabalin] Swelling   Quinidine Diarrhea and Other (See Comments)    Fever diarrhea   Simvastatin Other (See Comments)    Leg pain, myalgia   Tramadol Nausea Only   Verapamil Other (See Comments)    myalgias   Zetia [Ezetimibe] Other (See Comments)    Myalgia   Benazepril Hcl Cough   Ciprofloxacin Diarrhea   Codeine Nausea Only   Nitrofurantoin Monohyd Macro Nausea Only   Polymyxin B-Trimethoprim Swelling     Procedures This Admission:  1.  Implantation of a Abbott Dual Chamber PPM on 09/22/2023 by Dr. Jimmey Ralph. The patient received a Medtronic Azure XT DR [id number]  with a Medtronic Lead Passive MRI [id number] right atrial lead and a Medtronic SelectSure [id number] right ventricular lead.  There were no immediate post procedure complications.   2.  CXR on 09/22/2023 demonstrated no pneumothorax status post device  implantation.       Brief HPI: Catherine [name] is a 88 y.o. female was admitted for sinus pauses and brief AV block on monitoring and electrophysiology team asked to see for consideration of PPM implantation.  Past medical history includes above.  The patient has had symptomatic bradycardia without reversible causes identified.  Risks, benefits, and alternatives to PPM implantation were reviewed with the patient who wished to proceed.   Hospital Course:  The patient was admitted and underwent implantation of a Medtronic dual chamber PPM with details as outlined above.  She was monitored on telemetry overnight which demonstrated appropriate pacing.  Left chest was without hematoma or ecchymosis.  The device was interrogated and found to be functioning normally.  CXR was obtained and demonstrated no pneumothorax status post device implantation.  Wound care, arm mobility, and restrictions were reviewed with the patient.  The patient was examined and considered stable for discharge to home.    Anticoagulation resumption This patient should resume their Eliquis on Wednesday, January 1st, 2025.    Physical Exam: Vitals:   09/22/23 2007 09/22/23 2330 09/23/23 0434 09/23/23 0732  BP: (!) 175/63 (!) 130/56 (!) 150/59 133/65  Pulse: 60 60 60 60  Resp: 15 15 16  (!) 23  Temp: 98.4 F (36.9 C) 97.7 F (36.5 C) 98.4 F (36.9 C) 98.6 F (37 C)  TempSrc: Oral Oral Oral Oral  SpO2: 94% 92% 92% 92%  Weight:      Height:        GEN- NAD. A&O  x 3.  HEENT: Normocephalic, atraumatic Lungs- CTAB, Normal effort.  Heart- RRR, No M/G/R.  GI- Soft, NT, ND.  Extremities- No clubbing, cyanosis, or edema;  Skin- warm and dry, no rash or lesion, left chest without hematoma/ecchymosis  Discharge Medications:  Allergies as of 09/23/2023       Reactions   Aldactone [spironolactone] Other (See Comments)   dyspnea   Amoxicillin Palpitations   Tachycardia   Amlodipine Other (See Comments)   Low extremity  edema   Crestor [rosuvastatin] Other (See Comments)   myalgia   Diltiazem Other (See Comments)   Causing headaches   Flagyl [metronidazole Hcl] Other (See Comments)   Causing headaches   Flovent [fluticasone Propionate] Other (See Comments)   Leg cramps   Gabapentin Swelling   Livalo [pitavastatin] Other (See Comments)   myalgias   Lyrica [pregabalin] Swelling   Quinidine Diarrhea, Other (See Comments)   Fever diarrhea   Simvastatin Other (See Comments)   Leg pain, myalgia   Tramadol Nausea Only   Verapamil Other (See Comments)   myalgias   Zetia [ezetimibe] Other (See Comments)   Myalgia   Benazepril Hcl Cough   Ciprofloxacin Diarrhea   Codeine Nausea Only   Nitrofurantoin Monohyd Macro Nausea Only   Polymyxin B-trimethoprim Swelling        Medication List     PAUSE taking these medications    bumetanide 2 MG tablet Wait to take this until your doctor or other care provider tells you to start again. Commonly known as: Bumex Please take 2mg  daily in the morning and then 2mg  in the afternoon on MONDAY, [facility] AND FRIDAY   metolazone 5 MG tablet Wait to take this until your doctor or other care provider tells you to start again. Commonly known as: ZAROXOLYN TAKE ONE TABLET AS NEEDED ONCE A WEEK 30 MINUTES BEFORE BUMEX DOSE. AS NEEDED ONLY FOR WEIGHT GAIN 2LBS OVERNIGHT OR 5 LBS IN ONE WEEK   potassium chloride 10 MEQ tablet Wait to take this until your doctor or other care provider tells you to start again. Commonly known as: KLOR-CON TAKE UP TO 8 TABLETS DAILY AS DIRECTED       TAKE these medications    acetaminophen 500 MG tablet Commonly known as: TYLENOL Take 500 mg by mouth 2 (two) times daily.   albuterol 108 (90 Base) MCG/ACT inhaler Commonly known as: VENTOLIN HFA INHALE 2 PUFFS INTO THE LUNGS EVERY 4 HOURS AS NEEDED FOR AHEEZING OR SHORTNESS OF BREATH   apixaban 2.5 MG Tabs tablet Commonly known as: ELIQUIS Take 1 tablet (2.5 mg total) by  mouth 2 (two) times daily. Start taking on: September 28, 2023 What changed: These instructions start on September 28, 2023. If you are unsure what to do until then, ask your doctor or other care provider.   cholecalciferol 25 MCG (1000 UNIT) tablet Commonly known as: VITAMIN D3 Take 1,000 Units by mouth daily.   diazepam 5 MG tablet Commonly known as: VALIUM Take 1 tablet (5 mg total) by mouth at bedtime.   Entresto 24-26 MG Generic drug: sacubitril-valsartan Take 1 tablet by mouth 2 (two) times daily.   estradiol 0.1 MG/GM vaginal cream Commonly known as: ESTRACE VAGINAL USE 2 grams per vagina daily for 2 weeks and then 1 gram per vagina three times per week. What changed: additional instructions   fluticasone 50 MCG/ACT nasal spray Commonly known as: FLONASE Place 1 spray into both nostrils daily as needed for allergies or rhinitis.  HAIR/SKIN/NAILS/BIOTIN PO Take 1 tablet by mouth daily.   Multivitamin Adult Extra C Chew Chew 2 tablets by mouth daily.   HYDROcodone-acetaminophen 5-325 MG tablet Commonly known as: NORCO/VICODIN TAKE ONE TABLET EVERY 8 HOURS AS NEEDED FOR PAIN What changed: See the new instructions.   ipratropium-albuterol 0.5-2.5 (3) MG/3ML Soln Commonly known as: DUONEB Take 3 mLs by nebulization every 4 (four) hours as needed.        Disposition:  CIR with usual follow up as in AVS   Duration of Discharge Encounter: Greater than 30 minutes including physician time.  Dustin Flock, PA-C  09/23/2023 8:56 AM

## 2023-09-23 NOTE — Progress Notes (Signed)
Mobility Specialist Progress Note:    09/23/23 0900  Mobility  Activity Ambulated with assistance in hallway  Level of Assistance Contact guard assist, steadying assist  Assistive Device None  Distance Ambulated (ft) 300 ft  Activity Response Tolerated well  Mobility Referral Yes  Mobility visit 1 Mobility  Mobility Specialist Start Time (ACUTE ONLY) 0855  Mobility Specialist Stop Time (ACUTE ONLY) [id number]  Mobility Specialist Time Calculation (min) (ACUTE ONLY) 28 min   Pt received in bed, agreeable to mobility. Follows pacemaker precautions well. Took x2 standing rest breaks d/t fatigue and SOB. VSS throughout. Pt left in chair w/ call bell and RN present.  D'Vante Earlene Plater Mobility Specialist Please contact via Special educational needs teacher or Rehab office at [phone]

## 2023-09-23 NOTE — Progress Notes (Signed)
Went over AVS paperwork with patient/family, answered any questions, ivs removed, patient teaching performed by PA, belongings gathered and patient was wheeled out to family vehicle.

## 2023-09-23 NOTE — Care Management Important Message (Signed)
Important Message  Patient Details  Name: Catherine [name] MRN: [id number] Date of Birth: [birth year]   Important Message Given:  Yes - Medicare IM     Dorena Bodo 09/23/2023, 2:23 PM

## 2023-09-23 NOTE — Patient Outreach (Signed)
  Care Management  Transitions of Care Program Transitions of Care Post-discharge week 2   09/23/2023 Name: Catherine [name] MRN: [id number] DOB: 12-Feb-1935  Subjective: Catherine [name] is a 88 y.o. year old female who is a primary care patient of Burchette, Elberta Fortis, MD.         Patient enrolled in 30-day [facility] program. Upon chart review for weekly outreach noted that patient readmitted to hospital on 09/21/23 for "sinus pause" and remains inpatient.      Goals Addressed             This Visit's Progress    COMPLETED: TOC Care Plan       Current Barriers:  Knowledge Deficits related to plan of care for management of Pneumothorax and COVID   RNCM Clinical Goal(s):  Patient will work with the Care Management team over the next 30 days to address Transition of Care Barriers: Medication Management take all medications exactly as prescribed and will call provider for medication related questions as evidenced by EMR attend all scheduled medical appointments: PCP and Specialists as evidenced by EMR  through collaboration with RN Care manager, provider, and care team.   Interventions: Evaluation of current treatment plan related to  self management and patient's adherence to plan as established by provider  Transitions of Care:   Goal-Not on Track-pt readmitted-09/21/23   Patient Goals/Self-Care Activities: Participate in Transition of Care Program/Attend [facility] scheduled calls Notify RN Care Manager of TOC call rescheduling needs Take all medications as prescribed Attend all scheduled provider appointments Call pharmacy for medication refills 3-7 days in advance of running out of medications Call provider office for new concerns or questions   Follow Up Plan:  RN CM will follow for d/c plans & disposition         Plan: RN CM will monitor for d/c plans and disposition.   Antionette Fairy, RN,BSN,CCM RN Care Manager Transitions of Care  [facility]   Direct Phone: [phone] Toll Free: [phone] Fax: [phone]

## 2023-09-26 ENCOUNTER — Telehealth: Payer: Self-pay | Admitting: Family

## 2023-09-26 ENCOUNTER — Telehealth: Payer: Self-pay

## 2023-09-26 ENCOUNTER — Telehealth: Payer: Self-pay | Admitting: *Deleted

## 2023-09-26 NOTE — Telephone Encounter (Signed)
Triage team- please inform the patient she may take dose of Bumex today. If she need additional dose on Friday 09/30/23 she may take one that day. Recommend adhering to 2L (64 oz) fluid restriction, low sodium diet. Follow up as scheduled 10/03/23.  Alver Sorrow, NP

## 2023-09-26 NOTE — Telephone Encounter (Signed)
Pt would like to know whether she should start back taking    bumetanide (BUMEX) 2 MG tablet  Pt  has been having swelling in her hands and stomach since having Sunday. She seems to think it may have something to do with not taking above medication at the moment due to recent surgery. Please advise

## 2023-09-26 NOTE — Telephone Encounter (Signed)
Follow-up after same day discharge: Implant date: 09/22/2023 MD: [name] Device: Medtronic Pacemaker Location: Left Chest   Wound check visit: 10/07/2023 90 day MD follow-up: 12/26/2023  Remote Transmission received:no. Pt has a Relay monitor. I put it in the [facility] system today.  Dressing/sling removed: Yes  Confirm OAC restart on: She start her Eliquis the first of the year.  Please continue to monitor your cardiac device site for redness, swelling, and drainage. Call the device clinic at [phone] if you experience these symptoms, fever/chills, or have questions about your device.   Remote monitoring is used to monitor your cardiac device from home. This monitoring is scheduled every 91 days by our office. It allows [name] to keep an eye on the functioning of your device to ensure it is working properly.  Pt states her left hand and fingers is swollen. She would like to know if she can start back her fluid pills. I read her the note Caitlyn put in epic. I also let her speak with Boneta Lucks, rn.

## 2023-09-26 NOTE — Telephone Encounter (Signed)
Pt returning call

## 2023-09-26 NOTE — Telephone Encounter (Signed)
Spoke with Pt and messaged Gillian Shields, NP.  Will have Pt take one Bumex today.  She will take 2 tablets Bumex AM/PM tomorrow.  She will take 1 tablet on Wednesday.  This nurse will call Pt on Thursday to reevaluate fluid status.

## 2023-09-26 NOTE — Telephone Encounter (Signed)
Pt requested feedback on fluid management.  Per Gillian Shields, NP ok to take 1 tablet Bumex this afternoon.  Advised to take 2 doses tomorrow.  Advised to take 1 dose on Wednesday.  This nurse to call Pt on Thursday to reevaluate fluid status.

## 2023-09-26 NOTE — Transitions of Care (Post Inpatient/ED Visit) (Signed)
09/26/2023  Name: Catherine [name] MRN: [id number] DOB: November 14, 1934  Today's TOC FU Call Status: Today's TOC FU Call Status:: Successful TOC FU Call Completed TOC FU Call Complete Date: 09/26/23 Patient's Name and Date of Birth confirmed.  Transition Care Management Follow-up Telephone Call Date of Discharge: 09/23/23 Discharge Facility: [facility]) Type of Discharge: Inpatient Admission Primary Inpatient Discharge Diagnosis:: Sick Sinus Syndrome/ Pacemaker insertion How have you been since you were released from the hospital?: Better Any questions or concerns?: Yes Patient Questions/Concerns:: Patient stated she has swelling in lt hand fingers and abdomen. She wanted to know should she take the Bumex., Patient Questions/Concerns Addressed: Notified Provider of Patient Questions/Concerns (Patient has called the cardiologist and is wating on the return call.)  Items Reviewed: Did you receive and understand the discharge instructions provided?: Yes Medications obtained,verified, and reconciled?: Yes (Medications Reviewed) Any new allergies since your discharge?: No Dietary orders reviewed?: Yes Type of Diet Ordered:: low sodium heart healthy with 64 oz fluid restriction Do you have support at home?: Yes People in Home: spouse Name of Support/Comfort Primary Source: Will  Medications Reviewed Today: Medications Reviewed Today     Reviewed by Luella Cook, RN (Case Manager) on 09/26/23 at 1129  Med List Status: <None>   Medication Order Taking? Sig Documenting Provider Last Dose Status Informant  acetaminophen (TYLENOL) 500 MG tablet [id number] Yes Take 500 mg by mouth 2 (two) times daily. [provider] Taking Active Self, Pharmacy Records           Med Note Jola Schmidt   Wed Sep 21, 2023  1:24 PM) Confirmed taking on top of the hydrocodone/acetaminophen.   albuterol (VENTOLIN HFA) 108 (90 Base) MCG/ACT inhaler [id number] Yes INHALE 2 PUFFS INTO THE LUNGS  EVERY 4 HOURS AS NEEDED FOR AHEEZING OR SHORTNESS OF Benny Lennert, MD Taking Active Self, Pharmacy Records  apixaban (ELIQUIS) 2.5 MG TABS tablet [id number] Yes Take 1 tablet (2.5 mg total) by mouth 2 (two) times daily. Graciella Freer, PA-C Taking Active   bumetanide (BUMEX) 2 MG tablet [id number]  Please take 2mg  daily in the morning and then 2mg  in the afternoon on [place] AND FRIDAY  Patient not taking: Reported on 09/21/2023   Alver Sorrow, NP  Active Self, Pharmacy Records  cholecalciferol (VITAMIN D3) 25 MCG (1000 UNIT) tablet [id number] Yes Take 1,000 Units by mouth daily. [provider] Taking Active Self, Pharmacy Records  diazepam (VALIUM) 5 MG tablet [id number] Yes Take 1 tablet (5 mg total) by mouth at bedtime. Kristian Covey, MD Taking Active Self, Pharmacy Records  estradiol [facility] VAGINAL) 0.1 MG/GM vaginal cream [id number] Yes USE 2 grams per vagina daily for 2 weeks and then 1 gram per vagina three times per week.  Patient taking differently: USE 1 gram per vagina three times per week PRN   Kristian Covey, MD Taking Active Self, Pharmacy Records  fluticasone [facility]) 50 MCG/ACT nasal spray [id number] Yes Place 1 spray into both nostrils daily as needed for allergies or rhinitis. Coralyn Helling, MD Taking Active Self, Pharmacy Records  HYDROcodone-acetaminophen (NORCO/VICODIN) 5-325 MG tablet [id number] Yes TAKE ONE TABLET EVERY 8 HOURS AS NEEDED FOR PAIN  Patient taking differently: Take 2 tablets by mouth 2 (two) times daily.   Kristian Covey, MD Taking Active Self, Pharmacy Records  ipratropium-albuterol (DUONEB) 0.5-2.5 (3) MG/3ML SOLN [id number] Yes Take 3 mLs by nebulization every 4 (four) hours as needed. Carollee Herter, DO Taking Active Pharmacy  Records, Self  metolazone (ZAROXOLYN) 5 MG tablet [id number]  TAKE ONE TABLET AS NEEDED ONCE A WEEK 30 MINUTES BEFORE BUMEX DOSE. AS NEEDED ONLY FOR WEIGHT GAIN 2LBS OVERNIGHT OR 5 LBS IN ONE  WEEK Alver Sorrow, NP  Active Self, Pharmacy Records  Multiple Vitamins-Minerals (HAIR/SKIN/NAILS/BIOTIN PO) [id number] Yes Take 1 tablet by mouth daily. [provider] Taking Active Self, Pharmacy Records  Multiple Vitamins-Minerals (MULTIVITAMIN ADULT Roma Kayser) CHEW [id number] Yes Chew 2 tablets by mouth daily. [provider] Taking Active Self, Pharmacy Records  potassium chloride (KLOR-CON) 10 MEQ tablet [id number]  TAKE UP TO 8 TABLETS DAILY AS DIRECTED  Patient not taking: Reported on 09/21/2023   Chilton Si, MD  Active Self, Pharmacy Records  sacubitril-valsartan [facility]) 24-26 MG [id number] Yes Take 1 tablet by mouth 2 (two) times daily. Chilton Si, MD Taking Active Self, Pharmacy Records            Home Care and Equipment/Supplies: Any new equipment or medical supplies ordered?: NA Do you have any questions related to the use of the equipment/supplies?: No  Functional Questionnaire: Do you need assistance with bathing/showering or dressing?: No Do you need assistance with meal preparation?: No Do you need assistance with eating?: No Do you have difficulty maintaining continence: No Do you need assistance with getting out of bed/getting out of a chair/moving?: No Do you have difficulty managing or taking your medications?: No  Follow up appointments reviewed: PCP Follow-up appointment confirmed?: Yes Date of PCP follow-up appointment?: 10/18/22 Follow-up Provider: Dr Smitty Cords [facility] Follow-up appointment confirmed?: Yes Date of Specialist follow-up appointment?: 10/03/23 Follow-Up Specialty Provider:: [id number] Gillian Shields NP, [id number] Clementeen Hoof Do you need transportation to your follow-up appointment?: No Do you understand care options if your condition(s) worsen?: Yes-patient verbalized understanding  SDOH Interventions Today    Flowsheet Row Most Recent Value  SDOH Interventions   Food Insecurity  Interventions Intervention Not Indicated  Housing Interventions Intervention Not Indicated  Transportation Interventions Intervention Not Indicated, Patient Resources (Friends/Family)       Goals Addressed             This Visit's Progress    30 Day TOC program       Current Barriers:  Knowledge Deficits related to plan of care for management of Sick Sinus Syndrome/Pacemaker insertion   RNCM Clinical Goal(s):  Patient will work with the Care Management team over the next 30 days to address Transition of Care Barriers: Medication Management take all medications exactly as prescribed and will call provider for medication related questions as evidenced by EMR attend all scheduled medical appointments: PCP and Specialists as evidenced by EMR  through collaboration with RN Care manager, provider, and care team.   Interventions: Evaluation of current treatment plan related to  self management and patient's adherence to plan as established by provider  Transitions of Care:  New goal. Doctor Visits  - discussed the importance of doctor visits Referral to Longitudinal Nurse Case Manager for Ongoing follow-up  Patient Goals/Self-Care Activities: Participate in Transition of Care Program/Attend [facility] scheduled calls Notify RN Care Manager of [facility] call rescheduling needs Take all medications as prescribed Attend all scheduled provider appointments Call pharmacy for medication refills 3-7 days in advance of running out of medications  Follow Up Plan:  Telephone follow up appointment with care management team member scheduled for:  Antionette Fairy RN [id number] 11:00 The care management team will reach out to the patient again over the next 7 days.  Next PCP appointment scheduled for:  [id number] Dr Caryl Never         Interventions Today    Flowsheet Row Most Recent Value  Chronic Disease   Chronic disease during today's visit Other  [sick sinus syndrome]  General Interventions   General  Interventions Discussed/Reviewed General Interventions Discussed, General Interventions Reviewed, Doctor Visits  [reviewed all Dr visits and made aware of changes]  Doctor Visits Discussed/Reviewed Doctor Visits Discussed, Doctor Visits Reviewed, Specialist  Communication with PCP/Specialists, RN  [referred to Antionette Fairy for Care coordination. Patient has called Cardiologist regarding Bumex]  Nutrition Interventions   Nutrition Discussed/Reviewed Nutrition Discussed, Nutrition Reviewed, Fluid intake  Pharmacy Interventions   Pharmacy Dicussed/Reviewed Pharmacy Topics Discussed, Pharmacy Topics Reviewed      Patient has consented to f/u outreach calls from [facility] RN Patient understands the [facility] meal benefit plan  Gean Maidens BSN RN Population Health- Transition of Care Team.  [facility] [phone]

## 2023-09-29 NOTE — Telephone Encounter (Signed)
 Called and spoke to patient to f/u on fluid status  Patient's concerns: - started bumex  as prescribed; able to recall medication instructions - swelling went down in hands and feet, practically gone - 1/1 weight: 123 lbs - 1/2 weight: 122 lbs  Will route to MD/APP for review. Patient verbalized understanding.

## 2023-09-29 NOTE — Telephone Encounter (Signed)
 Recommend continue Bumex 2mg  daily and follow up as scheduled 10/03/23.   Alver Sorrow, NP

## 2023-09-30 ENCOUNTER — Ambulatory Visit: Payer: Medicare Other | Attending: Internal Medicine | Admitting: Internal Medicine

## 2023-09-30 ENCOUNTER — Telehealth: Payer: Self-pay

## 2023-09-30 DIAGNOSIS — Z950 Presence of cardiac pacemaker: Secondary | ICD-10-CM | POA: Diagnosis not present

## 2023-09-30 DIAGNOSIS — Z79899 Other long term (current) drug therapy: Secondary | ICD-10-CM

## 2023-09-30 DIAGNOSIS — I472 Ventricular tachycardia, unspecified: Secondary | ICD-10-CM | POA: Diagnosis not present

## 2023-09-30 NOTE — Patient Instructions (Signed)
 Medication Instructions:  Your physician recommends that you continue on your current medications as directed. Please refer to the Current Medication list given to you today.  *If you need a refill on your cardiac medications before your next appointment, please call your pharmacy*   Lab Work: BMET and Mg If you have labs (blood work) drawn today and your tests are completely normal, you will receive your results only by: MyChart Message (if you have MyChart) OR A paper copy in the mail If you have any lab test that is abnormal or we need to change your treatment, we will call you to review the results.   Testing/Procedures: A chest x-ray takes a picture of the organs and structures inside the chest, including the heart, lungs, and blood vessels. This test can show several things, including, whether the heart is enlarges; whether fluid is building up in the lungs; and whether pacemaker / defibrillator leads are still in place.    Follow-Up: At [facility], you and your health needs are our priority.  As part of our continuing mission to provide you with exceptional heart care, we have created designated Provider Care Teams.  These Care Teams include your primary Cardiologist (physician) and Advanced Practice Providers (APPs -  Physician Assistants and Nurse Practitioners) who all work together to provide you with the care you need, when you need it.  We recommend signing up for the patient portal called MyChart.  Sign up information is provided on this After Visit Summary.  MyChart is used to connect with patients for Virtual Visits (Telemedicine).  Patients are able to view lab/test results, encounter notes, upcoming appointments, etc.  Non-urgent messages can be sent to your provider as well.   To learn more about what you can do with MyChart, go to [email or web address].    Your next appointment:   As scheduled

## 2023-09-30 NOTE — Telephone Encounter (Signed)
 Pt agreed to come in at 3 pm.

## 2023-09-30 NOTE — Telephone Encounter (Signed)
 Pt granddaughter [name] called stating pt got her blood work done. She tried to get the Xray but could not get it done today. She wants to make sure its okay to wait until Monday to get the Xray. [name]'s phone number is [phone]. [name] is going to give her a call back after talking with Dr. Fernande.

## 2023-09-30 NOTE — Telephone Encounter (Signed)
 Called pt. 2 VT episodes under detection. Unable to determine termination of either episode. Pt reports swelling has improved. Reports yesterday felt like her heart was jumping out of her chest all day. No syncope. Stressed importance of coming to clinic. Pt trying to find ride and calling back.

## 2023-09-30 NOTE — Progress Notes (Signed)
 Pt seen in clinic. Concern for RA dislodgement vs VT. 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 Pt in Junctional Tachycardia that self terminated. 2 view Xray ordered. BMET mag ordered. Normotensive. Appears well. Asymptomatic. SK assessed pt in conjunction w/ 12 lead EKG and programmer.   Testing demonstrated no atrial capture even at maximum outputs and intermittent atrial sensing. VA dissociation was noted at times; electrocardiogram demonstrated a QRS morphology exceedingly low close to sinus suggestive of excellent conduction system pacing with good thresholds and adequate sensing.  As above, chest x-ray was ordered  Patient has been volume overloaded and has been treated with Bumex , Rx 2mg  daily 12/30>> C walker increased to 4 mg x 2days and now back to 2 mg She was on eval euvolemic.   Last K 3.6 on 12/27  Repeat pending   According to [facility] takes zaroxylyn and potassium but  paused. While at hospital   I am confused as her K are recorded in the low 3 x for 12/24  4.2 and 4.0  Called patient by way of her granddaughter and she will resume her potassium and her Bumex .  Have reached out to Dr. Raford to alert her to the status

## 2023-09-30 NOTE — Telephone Encounter (Signed)
 I asked the pt for a manual transmission for the nurse to review. 09/30/2023

## 2023-10-01 LAB — BASIC METABOLIC PANEL
BUN/Creatinine Ratio: 16 (ref 12–28)
BUN: 12 mg/dL (ref 8–27)
CO2: 30 mmol/L — ABNORMAL HIGH (ref 20–29)
Calcium: 9.3 mg/dL (ref 8.7–10.3)
Chloride: 100 mmol/L (ref 96–106)
Creatinine, Ser: 0.75 mg/dL (ref 0.57–1.00)
Glucose: 108 mg/dL — ABNORMAL HIGH (ref 70–99)
Potassium: 3.4 mmol/L — ABNORMAL LOW (ref 3.5–5.2)
Sodium: 142 mmol/L (ref 134–144)
eGFR: 77 mL/min/{1.73_m2} (ref >59)

## 2023-10-01 LAB — MAGNESIUM: Magnesium: 2.1 mg/dL (ref 1.6–2.3)

## 2023-10-03 ENCOUNTER — Ambulatory Visit
Admission: RE | Admit: 2023-10-03 | Discharge: 2023-10-03 | Disposition: A | Discharge status: Home / Self Care | Payer: Medicare Other | Source: Ambulatory Visit | Attending: Internal Medicine | Admitting: Internal Medicine

## 2023-10-03 ENCOUNTER — Ambulatory Visit (HOSPITAL_BASED_OUTPATIENT_CLINIC_OR_DEPARTMENT_OTHER): Payer: Medicare Other | Admitting: Family

## 2023-10-03 ENCOUNTER — Encounter (HOSPITAL_BASED_OUTPATIENT_CLINIC_OR_DEPARTMENT_OTHER): Payer: Self-pay | Admitting: Family

## 2023-10-03 ENCOUNTER — Other Ambulatory Visit: Payer: Self-pay | Admitting: Pharmacist Clinician (PhC)/ Clinical Pharmacy Specialist

## 2023-10-03 VITALS — BP 130/62 | HR 73 | Ht 63.0 in | Wt 126.0 lb

## 2023-10-03 DIAGNOSIS — Z5181 Encounter for therapeutic drug level monitoring: Secondary | ICD-10-CM | POA: Diagnosis not present

## 2023-10-03 DIAGNOSIS — Z951 Presence of aortocoronary bypass graft: Secondary | ICD-10-CM | POA: Diagnosis not present

## 2023-10-03 DIAGNOSIS — I050 Rheumatic mitral stenosis: Secondary | ICD-10-CM | POA: Diagnosis not present

## 2023-10-03 DIAGNOSIS — Z950 Presence of cardiac pacemaker: Secondary | ICD-10-CM

## 2023-10-03 DIAGNOSIS — I472 Ventricular tachycardia, unspecified: Secondary | ICD-10-CM

## 2023-10-03 DIAGNOSIS — I517 Cardiomegaly: Secondary | ICD-10-CM | POA: Diagnosis not present

## 2023-10-03 DIAGNOSIS — I5032 Chronic diastolic (congestive) heart failure: Secondary | ICD-10-CM

## 2023-10-03 DIAGNOSIS — E785 Hyperlipidemia, unspecified: Secondary | ICD-10-CM | POA: Diagnosis not present

## 2023-10-03 DIAGNOSIS — I25118 Atherosclerotic heart disease of native coronary artery with other forms of angina pectoris: Secondary | ICD-10-CM | POA: Diagnosis not present

## 2023-10-03 DIAGNOSIS — J90 Pleural effusion, not elsewhere classified: Secondary | ICD-10-CM | POA: Diagnosis not present

## 2023-10-03 NOTE — Progress Notes (Signed)
 Cardiology Office Note:  .   Date:  10/03/2023  ID:  Catherine [name], DOB [birth year], MRN [id number] PCP: Micheal Wolm ORN, MD  [facility] Providers Cardiologist:  Annabella Scarce, MD    History of Present Illness: .   Catherine [name] is a 89 y.o. 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    History of rheumatic MV disease and underwent mitral commissurotomy in the 1970s. 09/2017 she underwent bioprosthetic MVR, CABGx1, and bilateral MAZE procedure with Dr. Dusty.    She was seen 11/2020 by Dr. Scarce with improvement in abdominal bloating after transition from Lasix  to Torsemide . Her persistent LE edema was thought to be due to venous insufficiency. Unable to tolerate compression socks and declined referral to [facility].   Hospitalized 10/2021 with COPD exacerbation and HFpEF.  Echo 10/2021 LVEF 60 to 65%, RV mildly reduced, RVSP 58 mmHg, moderate BAE, normal-appearing bioprosthetic MVR, moderate TR.  She was admitted 01/05/22 for acute on chronic diastolic heart failure with acute hypoxic respiratory failure and COPD exacerbation.   She developed bradycardia, Metoprolol  reduced. She was trialed on amiodarone  but did not tolerate.   At clinic 03/04/22 her Losartan  and Torsemide  were transitioned to Entresto  and Bumex . 03/2022 Metolazone  added once per week for volume control.     Echo 11/03/22 normal LVEF 60 to 65%, RV moderately reduced function, mild elevated PASP, MV prosthesis functioning appropriately, mild aortic stenosis with mean gradient 11 mmHg.  Despite further reduction and discontinuation of Metoprolol  bradycardia persisted. Admitted 12/25-12/27/24 with placement of dual chamber PPM. Now undergoing workup for possible RA dislodgement.   Presents today  for follow up with her husband.  Weight down 4 pounds from clinic visit 4 months ago. Notes still having back pain related to two prior back surgeries which is limiting her activity - she is using Hydrocodone  PRN. The provider following this has offered more injections which she is considering. SBP at home 120-130s and HR in the 50s. Stable mild exertional dyspnea with more than usual activity but no orthopnea, PND, edema.    Presents today for follow up with her husband. Weight today 126 lbs (prior visit 08/29/23 weight 128 lbs). Notes feeling anxious if her heart rat eis too fast or too slow. PPM site still sore but getting better. Weighing daily at home. Yesterday 121 lbs at home which was stable for her. LE edema improved since resuming Bumex  2mg  daily. Notes mild exertional dyspnea which is overall unchanged. She notes some vaginal itching and started vagasil wipe, encouraged to discuss with PCP. Exercise limited by sciatica discomfort.    ROS: Please see the history of present illness.    All other systems reviewed and are negative.   Studies Reviewed: .        Cardiac Studies & Procedures      ECHOCARDIOGRAM  ECHOCARDIOGRAM COMPLETE 11/03/2022  Narrative ECHOCARDIOGRAM REPORT    Patient Name:   Catherine [name] Date of Exam: 11/03/2022 Medical Rec #:  [id number]      Height:       63.0 in Accession #:    [id number]     Weight:       130.4 lb Date of Birth:  1935-02-15     BSA:  1.612 m Patient Age:    87 years       BP:           120/60 mmHg Patient Gender: F              HR:           42 bpm. Exam Location:  Outpatient  Procedure: 2D Echo, 3D Echo, Cardiac Doppler, Color Doppler and Strain Analysis  Indications:    Aortic Stenosis  History:        Patient has prior history of Echocardiogram examinations, most recent 10/30/2021. CHF, Prior CABG, COPD, Arrythmias:Atrial Fibrillation; Risk Factors:Former Smoker and Hypertension. S/P Maze operation for atrial fibrillation;  Mitral valve ; Status post Mitral Valve Replacement-29 mm Medtronic Mosaic porcine bioprosthetic.  Mitral Valve: 29 mm Medtronic bioprosthetic valve valve is present in the mitral position.  Sonographer:    Orvil Holmes RDCS Referring Phys: [id number] Taina Landry S Mareon Robinette  IMPRESSIONS   1. Left ventricular ejection fraction, by estimation, is 60 to 65%. The left ventricle has normal function. The left ventricle has no regional wall motion abnormalities. Left ventricular diastolic function could not be evaluated. 2. Right ventricular systolic function is moderately reduced. The right ventricular size is mildly enlarged. There is mildly elevated pulmonary artery systolic pressure. 3. Left atrial size was severely dilated. 4. Right atrial size was moderately dilated. 5. The mitral valve has been repaired/replaced. No evidence of mitral valve regurgitation. The mean mitral valve gradient is 4.0 mmHg. There is a 29 mm Medtronic bioprosthetic valve present in the mitral position. Echo findings are consistent with normal structure and function of the mitral valve prosthesis. 6. Tricuspid valve regurgitation is moderate. 7. The aortic valve is tricuspid. There is moderate calcification of the aortic valve. There is moderate thickening of the aortic valve. Aortic valve regurgitation is not visualized. Mild aortic valve stenosis. Aortic valve area, by VTI measures 2.20 cm. Aortic valve mean gradient measures 11.0 mmHg. Aortic valve Vmax measures 2.23 m/s. 8. The inferior vena cava is dilated in size with >50% respiratory variability, suggesting right atrial pressure of 8 mmHg. 9. Cannot exclude a small PFO.  Comparison(s): No significant change from prior study.  Conclusion(s)/Recommendation(s): Bradycardic into the 40s during study.  FINDINGS Left Ventricle: Left ventricular ejection fraction, by estimation, is 60 to 65%. The left ventricle has normal function. The left ventricle has no regional  wall motion abnormalities. The left ventricular internal cavity size was normal in size. There is borderline left ventricular hypertrophy. Left ventricular diastolic function could not be evaluated due to mitral valve replacement. Left ventricular diastolic function could not be evaluated.  Right Ventricle: The right ventricular size is mildly enlarged. Right vetricular wall thickness was not well visualized. Right ventricular systolic function is moderately reduced. There is mildly elevated pulmonary artery systolic pressure. The tricuspid regurgitant velocity is 2.75 m/s, and with an assumed right atrial pressure of 8 mmHg, the estimated right ventricular systolic pressure is 38.2 mmHg.  Left Atrium: Left atrial size was severely dilated.  Right Atrium: Right atrial size was moderately dilated.  Pericardium: There is no evidence of pericardial effusion.  Mitral Valve: Mobile structures in LV most consistent with ruptured chordae/papillary muscle. Struts of MVR extend into LV cavity/LVOT without significant obstruction by gradients/doppler. The mitral valve has been repaired/replaced. No evidence of mitral valve regurgitation. There is a 29 mm Medtronic bioprosthetic valve present in the mitral position. Echo findings are consistent with normal structure and function of the mitral valve  prosthesis. MV peak gradient, 14.4 mmHg. The mean mitral valve gradient is 4.0 mmHg.  Tricuspid Valve: The tricuspid valve is normal in structure. Tricuspid valve regurgitation is moderate . No evidence of tricuspid stenosis.  Aortic Valve: The aortic valve is tricuspid. There is moderate calcification of the aortic valve. There is moderate thickening of the aortic valve. Aortic valve regurgitation is not visualized. Mild aortic stenosis is present. Aortic valve mean gradient measures 11.0 mmHg. Aortic valve peak gradient measures 19.9 mmHg. Aortic valve area, by VTI measures 2.20 cm.  Pulmonic Valve: The  pulmonic valve was grossly normal. Pulmonic valve regurgitation is trivial. No evidence of pulmonic stenosis.  Aorta: The aortic root, ascending aorta, aortic arch and descending aorta are all structurally normal, with no evidence of dilitation or obstruction.  Venous: The inferior vena cava is dilated in size with greater than 50% respiratory variability, suggesting right atrial pressure of 8 mmHg.  IAS/Shunts: Cannot exclude a small PFO.   LEFT VENTRICLE PLAX 2D LVIDd:         4.43 cm   Diastology LVIDs:         2.85 cm   LV e' medial:    3.50 cm/s LV PW:         1.29 cm   LV E/e' medial:  47.7 LV IVS:        0.93 cm   LV e' lateral:   6.53 cm/s LVOT diam:     1.70 cm   LV E/e' lateral: 25.6 LV SV:         132 LV SV Index:   82 LVOT Area:     2.27 cm  3D Volume EF: 3D EF:        64 % LV EDV:       110 ml LV ESV:       40 ml LV SV:        70 ml  RIGHT VENTRICLE RV Basal diam:  3.99 cm RV Mid diam:    2.62 cm RV S prime:     8.66 cm/s TAPSE (M-mode): 0.7 cm  LEFT ATRIUM              Index        RIGHT ATRIUM           Index LA diam:        4.90 cm  3.04 cm/m   RA Area:     18.00 cm LA Vol (A2C):   95.9 ml  59.48 ml/m  RA Volume:   48.90 ml  30.33 ml/m LA Vol (A4C):   105.0 ml 65.13 ml/m LA Biplane Vol: 103.0 ml 63.89 ml/m AORTIC VALVE AV Area (Vmax):    2.33 cm AV Area (Vmean):   2.22 cm AV Area (VTI):     2.20 cm AV Vmax:           223.00 cm/s AV Vmean:          149.000 cm/s AV VTI:            0.600 m AV Peak Grad:      19.9 mmHg AV Mean Grad:      11.0 mmHg LVOT Vmax:         229.00 cm/s LVOT Vmean:        146.000 cm/s LVOT VTI:          0.582 m LVOT/AV VTI ratio: 0.97  AORTA Ao Root diam: 2.40 cm  MITRAL VALVE  TRICUSPID VALVE MV Area (PHT): 1.84 cm     TR Peak grad:   30.2 mmHg MV Area VTI:   2.10 cm     TR Vmax:        275.00 cm/s MV Peak grad:  14.4 mmHg MV Mean grad:  4.0 mmHg     SHUNTS MV Vmax:       1.90 m/s     Systemic  VTI:  0.58 m MV Vmean:      85.3 cm/s    Systemic Diam: 1.70 cm MV Decel Time: 412 msec MV E velocity: 167.00 cm/s  Shelda Bruckner MD Electronically signed by Shelda Bruckner MD Signature Date/Time: 11/03/2022/2:28:21 PM    Final  TEE  ECHO TEE 10/14/2016  Interpretation Summary  Left atrium: Cavity is dilated and dilated.  Right atrium: Cavity is dilated.  Tricuspid valve: Mild regurgitation. The tricuspid valve regurgitation jet is central.  Mitral valve: Suggestive of rheumatic valve disease with hockey stick appearance of anterior leaflet and dilated mitral annulus. Severe leaflet thickening is present. Moderate leaflet calcification is present. Mild mitral annular calcification. Mild stenosis. Abnormal anterior mitral and abnormal posterior mitral leaflet mobility. Moderate to severe regurgitation.  Aortic valve: The valve is trileaflet. Mild valve thickening present. No stenosis. No regurgitation. 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  Pulmonic valve: Trace regurgitation.  Right ventricle: Normal cavity size, wall thickness and ejection fraction.  Aorta: The ascending aorta is mildly dilated.  Left ventricle: Normal cavity size. Concentric hypertrophy of mild severity. LV systolic function is normal with an EF of 60-65%. There are no obvious wall motion abnormalities.  Mitral valve: There is a bioprosthetic mitral valve.  MONITORS  CARDIAC EVENT MONITOR 06/28/2017  Narrative 7 Day Event Monitor  Quality: Fair.  Baseline artifact. Predominant rhythm: Sinus rhythm with PACs and sinus arrhythmia.  Cannot  rule out episodes of atrial fibrillation.   Tiffany C. Raford, MD, [facility] 07/15/2017 2:15 PM           Risk Assessment/Calculations:    CHA2DS2-VASc Score = 6   This indicates a 9.7% annual risk of stroke. The patient's score is based upon: CHF History:  1 HTN History: 1 Diabetes History: 0 Stroke History: 0 Vascular Disease History: 1 Age Score: 2 Gender Score: 1            Physical Exam:   VS:  BP 130/62 (BP Location: Left Arm, Patient Position: Sitting, Cuff Size: Normal)   Pulse 73   Ht 5' 3 (1.6 m)   Wt 126 lb (57.2 kg)   SpO2 95%   BMI 22.32 kg/m    Wt Readings from Last 3 Encounters:  10/03/23 126 lb (57.2 kg)  09/22/23 126 lb 5.2 oz (57.3 kg)  09/20/23 128 lb 12.8 oz (58.4 kg)    GEN: Well nourished, well developed in no acute distress NECK: No JVD; No carotid bruits CARDIAC: RRR, no murmurs, rubs, gallops RESPIRATORY:  Clear to auscultation without rales, wheezing or rhonchi  ABDOMEN: Soft, non-tender, non-distended EXTREMITIES:  No edema; No deformity   ASSESSMENT AND PLAN: .    Sinus node dysfunction s/p PPM - Per EP. Undergoing workup for possible RA lead dislodgement with plan only to intervene if symtpomatic.  HFpEF - Euvolemic and well compensated on exam. Continue Bumex  2mg  daily. May take Metolazone  once per week PRN.  Hypokalemia - In setting of diuretic use. Unclear when she  resumed potassium. Will hav eher take potassium 50mEq AM and 40mEq PM tomorrow and 40mEq BID thereafter. BMP Friday for monitoring.  CAD / HLD - Stable with no anginal symptoms. No indication for ischemic evaluation.  Intolerant to rosuvastatin , pravastatin, zetia . Declined Repatha and other statin. Continue with plan for Leqvio .  HTN -  BP well controlled. Continue current antihypertensive regimen.   S/p MVR / mild AS - Stable on echo 10/2022.  Persistent atrial fib / Hypercoagulable state - RRR on auscultation. Beta blocker previously discontinued due to bradycardia, now s/p PPM. ontinue Eliquis  2.5mg  BID. Reduced dose due to age, body habitus.        Dispo: follow up as scheduled  Signed, Reche GORMAN Finder, NP

## 2023-10-03 NOTE — Patient Instructions (Addendum)
 Medication Instructions:  TAKE 5 POTASSIUM TOMORROW MORNING AND 4 TOMORROW AFTERNOON ON WEDNESDAY GO BACK TO 4 TABLETS TWICE A DAY   TAKE BUMEX  2 MG DAILY   WHEN YOU GET HOME MAKE SURE YOU ARE NOT TAKING METOPROLOL    Labwork: BMET/MAGNESIUM  FRIDAY WHEN YOU GO TO [address] OFFICE  Testing/Procedures: NONE  Follow-Up: KEEP AS SCHEDULED   SPECIAL INSTRUCTIONS: IF THE VAGINAL ITCHING CONTINUES/GETS WORSE CONTACT DR MICHEAL   If you need a refill on your cardiac medications before your next appointment, please call your pharmacy.

## 2023-10-04 ENCOUNTER — Ambulatory Visit: Payer: Self-pay | Admitting: Family Medicine

## 2023-10-04 ENCOUNTER — Telehealth (HOSPITAL_BASED_OUTPATIENT_CLINIC_OR_DEPARTMENT_OTHER): Payer: Self-pay | Admitting: *Deleted

## 2023-10-04 ENCOUNTER — Telehealth: Payer: Self-pay

## 2023-10-04 MED ORDER — FLUCONAZOLE 150 MG PO TABS
150.0000 mg | ORAL_TABLET | Freq: Once | ORAL | 0 refills | Status: AC
Start: 2023-10-04 — End: 2023-10-04

## 2023-10-04 NOTE — Telephone Encounter (Signed)
    Chief Complaint: vaginal burning Symptoms: vaginal burning, vaginal itching, vaginal redness Frequency: x 2 days Pertinent Negatives: Patient denies fever Disposition: [] ED /[] Urgent Care (no appt availability in office) / [x] Appointment(In office/virtual)/ []  [facility]/ [] Home Care/ [x] Refused Recommended Disposition /[] [facility]/ []  Follow-up with PCP Additional Notes: Patient reports that she has been experiencing vaginal burning, itching, and redness for the past few days. Patient believes she has a yeast infection due to antibiotics that she was on recently. Per protocol, this RN attempted to schedule in office appt. Patient refused and is requesting medication be called in today without being seen. Patient reports she has attempted OTC treatment with no symptom relief. This RN advised that she would send request to appropriate person for follow-up but could not guarantee medication would be called in without being seen. Patient verbalized understanding.   Copied from CRM [id number]. Topic: Appointments - Appointment Scheduling >> Oct 04, 2023  8:06 AM Victoria A wrote: Patient called because she has a Yeast Infection. Patient also had a Pacemaker surgery at [facility]. Reason for Disposition  [1] MILD-MODERATE pain AND [2] present > 24 hours  (Exception: Chronic pain.)  Answer Assessment - Initial Assessment Questions 1. SYMPTOM: What's the main symptom you're concerned about? (e.g., pain, itching, dryness)     Burning, dryness, redness 2. LOCATION: Where is the  burning located? (e.g., inside/outside, left/right)     Inside and outside 3. ONSET: When did the burning start?     2 days ago 4. PAIN: Is there any pain? If Yes, ask: How bad is it? (Scale: 1-10; mild, moderate, severe)   -  MILD (1-3): Doesn't interfere with normal activities.    -  MODERATE (4-7): Interferes with normal activities (e.g., work or school) or awakens from sleep.     -   SEVERE (8-10): Excruciating pain, unable to do any normal activities.     moderate 5. ITCHING: Is there any itching? If Yes, ask: How bad is it? (Scale: 1-10; mild, moderate, severe)     mild 6. CAUSE: What do you think is causing the discharge? Have you had the same problem before? What happened then?     I think I have a yeast infection 7. OTHER SYMPTOMS: Do you have any other symptoms? (e.g., fever, itching, vaginal bleeding, pain with urination, injury to genital area, vaginal foreign body)     Redness  Protocols used: Vaginal Symptoms-A-AH

## 2023-10-04 NOTE — Telephone Encounter (Signed)
Patient aware and rx sent  

## 2023-10-04 NOTE — Telephone Encounter (Signed)
 Faxed patient assistance forms for Eliquis, confirmation received

## 2023-10-04 NOTE — Telephone Encounter (Signed)
 Patient called in requesting an update on her medication request from this morning. Advised patient that Dr. Micheal had acknowledged medication request, per note in chart. 1 x flucanozole 150 mg tablet to be sent in, per MD. Advised patient to wait a few hours for the prescription to be filled. Advised patient to call back if she does not hear from the pharmacy by the end of the day.  Reason for Disposition  General information question, no triage required and triager able to answer question  Answer Assessment - Initial Assessment Questions 1. REASON FOR CALL or QUESTION: What is your reason for calling today? or How can I best help you? or What question do you have that I can help answer?     Patient calling in asking for an update regarding the prescription for vaginitis she requested this morning.  Protocols used: Information Only Call - No Triage-A-AH

## 2023-10-04 NOTE — Addendum Note (Signed)
 Addended by: Christy Sartorius on: 10/04/2023 03:55 PM   Modules accepted: Orders

## 2023-10-04 NOTE — Telephone Encounter (Signed)
 Dr. Raford and Kristin, please see denial reason  Auth Submission: DENIED Site of care: Site of care: [facility] Payer: [facility] Medication & CPT/J Code(s) submitted: Leqvio  (Inclisiran) J1306 Route of submission (phone, fax, portal): portal  Authorization has been DENIED because the patient must try and fail either Praluent or Reptha before they will consider Leqvio .

## 2023-10-04 NOTE — Telephone Encounter (Signed)
 Patient called in requesting an update on her medication request from this morning. Advised patient that Dr. Micheal had acknowledged medication request, per note in chart. 1 x flucanozole 150 mg tablet to be sent in, per MD. Advised patient to wait a few hours for the prescription to be filled. Advised patient to call back if she does not hear from the pharmacy by the end of the day.

## 2023-10-04 NOTE — Telephone Encounter (Signed)
Please see previous encounter from today.

## 2023-10-05 ENCOUNTER — Other Ambulatory Visit: Payer: Self-pay

## 2023-10-05 NOTE — Patient Outreach (Signed)
 Care Management  Transitions of Care Program Transitions of Care Post-discharge week 2   10/05/2023 Name: Catherine [name] MRN: [id number] DOB: 02/27/1935  Subjective: Catherine [name] is a 89 y.o. year old female who is a primary care patient of Burchette, Wolm ORN, MD. The Care Management team  Engaged with patient by telephone to assess and address transitions of care needs.   Consent to Services:  Patient was given information about care management services, agreed to services, and gave verbal consent to participate. Agreeable to transfer to RN CM for continued TOC calls and follow up appt scheduled accordingly.  Assessment:   Pt voices she is doing better was having yeast infection-has taken some med and sitz bath that helped. She shares her concerns about pacer and having to go for further workup/teting due ot possible issues with device already. She does nto want to have to undergo getting device taken out and another one inserted and hoepful cardiology will tell her its an easy fix. She is aware of s/s to monitor for and has remote home monitoring device for cardiology office to assess pacer. Denies any RN CM needs or concerns at this time.         SDOH Interventions    Flowsheet Row Telephone from 09/26/2023 in [place] [facility] DEPARTMENT Telephone from 09/14/2023 in [place] POPULATION HEALTH DEPARTMENT Care Coordination from 12/22/2022 in [facility] Clinical Support from 04/09/2022 in [facility] [place] Chronic Care Management from 11/06/2021 in [facility] [place] Clinical Support from 04/07/2021 in [facility] [place]  SDOH Interventions        Food Insecurity Interventions Intervention Not Indicated Intervention Not Indicated Intervention Not Indicated Intervention Not Indicated Intervention Not Indicated Intervention Not Indicated  Housing Interventions Intervention Not Indicated  Intervention Not Indicated Intervention Not Indicated Intervention Not Indicated Intervention Not Indicated Intervention Not Indicated  Transportation Interventions Intervention Not Indicated, Patient Resources (Friends/Family) Intervention Not Indicated, Patient Resources (Friends/Family) -- Intervention Not Indicated Intervention Not Indicated --  Utilities Interventions -- Intervention Not Indicated -- -- -- --  Financial Strain Interventions -- -- -- Intervention Not Indicated Intervention Not Indicated --  Physical Activity Interventions -- -- -- Intervention Not Indicated Other (Comments)  [reviewed Silver Sneakers when able] Intervention Not Indicated  Stress Interventions -- -- -- Intervention Not Indicated Intervention Not Indicated --  Social Connections Interventions -- -- -- Intervention Not Indicated -- Intervention Not Indicated        Goals Addressed             This Visit's Progress    TOC Care Plan       Current Barriers:  Knowledge Deficits related to plan of care for management of Sick Sinus Syndrome/Pacemaker insertion   RNCM Clinical Goal(s):  Patient will work with the Care Management team over the next 30 days to address Transition of Care Barriers: Medication Management take all medications exactly as prescribed and will call provider for medication related questions as evidenced by EMR attend all scheduled medical appointments: PCP and Specialists as evidenced by EMR  through collaboration with RN Care manager, provider, and care team.   Interventions: Evaluation of current treatment plan related to  self management and patient's adherence to plan as established by provider  Transitions of Care:  Goal on track:  Yes. Assessed for any changes in condition- pt voices she is doing better-was having a yeast infection earlier this week- called PCP  took one dose of Diflucan  as advised by provide and then she did a vinegar sitz bath and reports feeling 100x  better Assessed status of Pacer insertion- pt voices that site still has packing on it-area is sore and tender- adhering to activity restrictions Assessed cardiac status- pt voices that she was told by cardiology office by remote monitoring results that sometimes her heart has been beating too fast since pacer placement and that possible one of her pacer leads slipped out of place and she ay have to have Pacer repaired/replaced Assessed nutritional status-pt reports decreased appetite-everything tastes bland-drinking Boost about 1-2x/day, pt has scale-weighing daily-wgt today-119lbs Discussed recent appts with pt-she completed cardiology appt on 10/03/23 Reviewed upcoming appts with pt-she is going to for pacer check on this Friday,PCP appt on 10/19/23 Discussed pain mgmt-pt with chronic back pain-taking Hydrocodone  and Tylenol  as ordered  Patient Goals/Self-Care Activities: Participate in Transition of Care Program/Attend TOC scheduled calls Notify RN Care Manager of TOC call rescheduling needs Take all medications as prescribed Attend all scheduled provider appointments Call pharmacy for medication refills 3-7 days in advance of running out of medications Monitor wgt daily-notify provider of any abnormal wgt Monitor pacer site for any s/s of infection, report any abnormal cardiac sxs to provider  Follow Up Plan:  Telephone follow up appointment with care management team member scheduled for:  10/12/23-11 am The patient has been provided with contact information for the care management team and has been advised to call with any health related questions or concerns.           Plan: The patient has been provided with contact information for the care management team and has been advised to call with any health related questions or concerns.  Follow up appt scheduled with pt for 10/12/23-11 am.  Rama Pilling, RN,BSN,CCM RN Care Manager Transitions of Care  [facility]  Direct Phone: [phone] Toll Free: [phone] Fax: [phone]

## 2023-10-05 NOTE — Patient Instructions (Signed)
 Visit Information  Thank you for taking time to visit with me today. Please don't hesitate to contact me if I can be of assistance to you before your next scheduled telephone appointment.  Your next appointment is by telephone on 10/12/23 at 11am  Following is a copy of your care plan:   Goals Addressed             This Visit's Progress    TOC Care Plan       Current Barriers:  Knowledge Deficits related to plan of care for management of Sick Sinus Syndrome/Pacemaker insertion   RNCM Clinical Goal(s):  Patient will work with the Care Management team over the next 30 days to address Transition of Care Barriers: Medication Management take all medications exactly as prescribed and will call provider for medication related questions as evidenced by EMR attend all scheduled medical appointments: PCP and Specialists as evidenced by EMR  through collaboration with RN Care manager, provider, and care team.   Interventions: Evaluation of current treatment plan related to  self management and patient's adherence to plan as established by provider  Transitions of Care:  Goal on track:  Yes. Assessed for any changes in condition- pt voices she is doing better-was having a yeast infection earlier this week- called PCP took one dose of Diflucan  as advised by provide and then she did a vinegar sitz bath and reports feeling 100x better Assessed status of Pacer insertion- pt voices that site still has packing on it-area is sore and tender- adhering to activity restrictions Assessed cardiac status- pt voices that she was told by cardiology office by remote monitoring results that sometimes her heart has been beating too fast since pacer placement and that possible one of her pacer leads slipped out of place and she ay have to have Pacer repaired/replaced Assessed nutritional status-pt reports decreased appetite-everything tastes bland-drinking Boost about 1-2x/day, pt has scale-weighing daily-wgt  today-119lbs Discussed recent appts with pt-she completed cardiology appt on 10/03/23 Reviewed upcoming appts with pt-she is going to for pacer check on this Friday,PCP appt on 10/19/23 Discussed pain mgmt-pt with chronic back pain-taking Hydrocodone  and Tylenol  as ordered  Patient Goals/Self-Care Activities: Participate in Transition of Care Program/Attend TOC scheduled calls Notify RN Care Manager of TOC call rescheduling needs Take all medications as prescribed Attend all scheduled provider appointments Call pharmacy for medication refills 3-7 days in advance of running out of medications Monitor wgt daily-notify provider of any abnormal wgt Monitor pacer site for any s/s of infection, report any abnormal cardiac sxs to provider  Follow Up Plan:  Telephone follow up appointment with care management team member scheduled for:  10/12/23-11 am The patient has been provided with contact information for the care management team and has been advised to call with any health related questions or concerns.           The patient verbalized understanding of instructions, educational materials, and care plan provided today and DECLINED offer to receive copy of patient instructions, educational materials, and care plan.   The patient has been provided with contact information for the care management team and has been advised to call with any health related questions or concerns.   Please call the care guide team at [phone] if you need to cancel or reschedule your appointment.   Please call the Suicide and Crisis Lifeline: 988 call the [place]  National Suicide Prevention Lifeline: 519-386-7028 or TTY: (320) 812-1177 TTY [phone]) to talk to a trained counselor if you are  experiencing a Mental Health or Behavioral Health Crisis or need someone to talk to.  Rama Pilling, RN,BSN,CCM RN Care Manager Transitions of Care  [facility]  Direct Phone: [phone] Toll  Free: [phone] Fax: [phone]

## 2023-10-06 ENCOUNTER — Ambulatory Visit: Payer: Self-pay | Admitting: Family Medicine

## 2023-10-06 NOTE — Telephone Encounter (Signed)
 Copied from CRM [id number]. Topic: Clinical - Red Word Triage >> Oct 06, 2023  8:53 AM Eleanor C wrote: [facility] that prompted transfer to Nurse Triage: patient saw Dr. Micheal a few days ago for a bad UTI, and got treated. Pill he gave her was working at first however she feels worse now and is in a lot of pain  Chief Complaint: yeast infection to peri area Symptoms: burning, itching Frequency: constant Pertinent Negatives: Patient denies fever, sob Disposition: [] ED /[x] Urgent Care (no appt availability in office) / [] Appointment(In office/virtual)/ []  [place] [facility]/ [] Home Care/ [] Refused Recommended Disposition /[] [employer] [facility]/ []  Follow-up with PCP Additional Notes: Patient very anxious, states had medication called in for yeast infection, one pill of fluconazole .  States symptoms are worse and the pill didn't help.  Patient was instructed to make an appointment for today.  Patient kept debating on going to UC or making an appointment for today.  Apt's for today are gone.  Instructed to go to UC for evaluation.  Care advice given, denies questions and instructed to go to ER if becomes worse.   Reason for Disposition  [1] Finished taking antibiotics AND [2] symptoms are BETTER but [3] not completely gone  Answer Assessment - Initial Assessment Questions 1. INFECTION: What infection is the antibiotic being given for?     Yeast infection to peri area 2. ANTIBIOTIC: What antibiotic are you taking How many times per day?     Fluconazole   3. DURATION: When was the antibiotic started?     Had one pill 4. MAIN CONCERN OR SYMPTOM:  What is your main concern right now?     Itches and medication  Isn't working 5. BETTER-SAME-WORSE: Are you getting better, staying the same, or getting worse compared to when you first started the antibiotics? If getting worse, ask: In what way?      worse 6. FEVER: Do you have a fever? If Yes, ask: What is your temperature, how  was it measured, and when did it start?     Denies, but states gets the chills 7. SYMPTOMS: Are there any other symptoms you're concerned about? If Yes, ask: When did it start?     Monday 8. FOLLOW-UP APPOINTMENT: Do you have a follow-up appointment with your doctor?     no  Protocols used: Infection on Antibiotic Follow-up Call-[facility]

## 2023-10-07 ENCOUNTER — Encounter: Payer: Self-pay | Admitting: Student

## 2023-10-07 ENCOUNTER — Ambulatory Visit: Payer: Medicare Other | Admitting: Physician Assistant

## 2023-10-07 ENCOUNTER — Ambulatory Visit: Payer: Medicare Other | Attending: Student | Admitting: Student

## 2023-10-07 VITALS — HR 68

## 2023-10-07 DIAGNOSIS — I495 Sick sinus syndrome: Secondary | ICD-10-CM

## 2023-10-07 DIAGNOSIS — Z950 Presence of cardiac pacemaker: Secondary | ICD-10-CM | POA: Diagnosis not present

## 2023-10-07 DIAGNOSIS — I5032 Chronic diastolic (congestive) heart failure: Secondary | ICD-10-CM | POA: Diagnosis not present

## 2023-10-07 DIAGNOSIS — I472 Ventricular tachycardia, unspecified: Secondary | ICD-10-CM

## 2023-10-07 LAB — CUP PACEART INCLINIC DEVICE CHECK
Date Time Interrogation Session: 20250110131027
Implantable Lead Connection Status: 753985
Implantable Lead Connection Status: 753985
Implantable Lead Implant Date: 20241226
Implantable Lead Implant Date: 20241226
Implantable Lead Location: 753859
Implantable Lead Location: 753860
Implantable Lead Model: 3830
Implantable Lead Model: 4574
Implantable Pulse Generator Implant Date: 20241226

## 2023-10-07 NOTE — Progress Notes (Signed)
 Cardiology Office Note:  .   Date:  10/07/2023  ID:  [name], DOB March 31, 1935, MRN [id number] PCP: Micheal Wolm ORN, MD  [facility] Providers Cardiologist:  Annabella Scarce, MD { EP: Dr. Kennyth    History of Present Illness: .   TENA LINEBAUGH is a 89 y.o. female w/PMHx of HTN, COPD, CKD (IIIa), Rheumatic heart disease (in 1970s > mitral commissurotomy >> s/p MVR/bioprosthetic in 2018) and CAD (CABG an MAZE 2018), AFib, chronic diastolic CHF  121424 admitettd with SOB/hypoxic, _ COVID > small L apical PTX noted, during her stay she was observed to have pauses on telemetry, cards consulted (reported of 4 seconds) > suspected vagally mediated and was asymptomatic > recs to monitor outpt and see EP  She saw Dr. Waddell 09/20/23, noted she was having long pauses of over 4 seconds and feels fatigued. Despite not having frank syncope, felt certain the spells will worsen and recommended PPM Consideration for resuming amio post pacing with efforts to maintain SR  09/21/23 > referred to the ER 2/2 further pauses on her monitor 6 seconds (though nocturnal) Underwent PPM implant 09/22/23  Discharged 09/23/23 Instructed to resume Eliquis  09/28/23  Post discharge numerous telephone note with cards and device clinic [facility]  dosing questions Unclear why though remote transmission was requested via the clinic >  Concerns of possible VT of unclear duration  Pt was brought in 09/29/22 seen by device clinic RN and Dr. Fernande By reports, no atrial capture even at maximum outputs and intermittent atrial sensing. VA dissociation was noted at times; electrocardiogram demonstrated a QRS morphology exceedingly low close to sinus suggestive of excellent conduction system pacing with good thresholds and adequate sensing.  XRAY 10/02/22 IMPRESSION: Right-sided pacing device with leads projecting over right atrium and right ventricle. Decreased cardiomegaly and small pleural effusions.  10/02/22 :  cards further addressed volume/meds/lytes  As above, chest x-ray was ordered which is normal appearing.  Today's visit is scheduled as a wound check visit. She has no complaints and is reassured that findings from 1/3 were similar to findings at time of implant per Dr. Carroll note. Given extensive atriopathy and scarring, these findings may repeat themselves in the future, especially if pt has more AF, which her atrial lead is unlikely to sense.    Device information MDT dual chamber PPM implanted 09/22/23  Arrhythmia/AAD hx Amiodarone  started April 2023 >> qyuickly discontinued 2/2 feeling poorly and labile INRs  Studies Reviewed: SABRA    EKG is not performed today  EKG performed 09/23/2023 showed AV dual pacing  DEVICE interrogation done today and reviewed by myself Battery and lead measurements are good   Echo 11/03/22 LVEF 60-65%, mod RV dysfunction, severe LAE, mod RAE, stable MVR, Mod TR  Risk Assessment/Calculations:    Physical Exam:   VS:  Pulse 68  Wt Readings from Last 3 Encounters:  10/03/23 126 lb (57.2 kg)  09/22/23 126 lb 5.2 oz (57.3 kg)  09/20/23 128 lb 12.8 oz (58.4 kg)    GEN: Well nourished, well developed in no acute distress NECK: No JVD; No carotid bruits CARDIAC: RRR, no murmurs, rubs, gallops RESPIRATORY:   CTA b/l without rales, wheezing or rhonchi  ABDOMEN: Soft, non-tender, non-distended EXTREMITIES:  No edema; No deformity   Steri-strips removed. PPM site is stable, no thinning, fluctuation, tethering.   ASSESSMENT AND PLAN: .    SND s/p Dual chamber MDT PPM Normal device function See PaceArt Report No changes today.  Acute implant outputs  remain Her atrial lead is PASSIVE and may result in undersensing in AF. If this becomes a recurrent issue, will need to change to VVI.   I am unable to replicate reported findings from 1/3 today.   Persistent AFib CHA2DS2Vasc is 6, on Eliquis  <1 % burden, complicated by possible atrial undersensing.    VHD Rheumatic  > MVR (bioprosthetic) > functioining well on her last echo Mod TR Stable last echo  CAD Denies s/s ischemia C/w Dr. Chana  Secondary hypercoagulable state Pt on Eliquis  as above   Dispo: 3 month follow up as scheduled.   Signed, Ozell Prentice Passey, PA-C

## 2023-10-07 NOTE — Progress Notes (Deleted)
 Cardiology Office Note:  .   Date:  10/07/2023  ID:  Catherine [name], DOB 08-30-1935, MRN [id number] PCP: Micheal Wolm ORN, MD  [facility] Providers Cardiologist:  Annabella Scarce, MD { EP: Dr. Kennyth    History of Present Illness: .   Catherine [name] is a 89 y.o. female w/PMHx of HTN, COPD, CKD (IIIa), Rheumatic heart disease (in 1970s > mitral commissurotomy >> s/p MVR/bioprosthetic in 2018) and CAD (CABG an MAZE 2018), AFib, chronic diastolic CHF  121424 admitettd with SOB/hypoxic, _ COVID > small L apical PTX noted, during her stay she was observed to have pauses on telemetry, cards consulted (reported of 4 seconds) > suspected vagally mediated and was asymptomatic > recs to monitor outpt and see EP  She saw Dr. Waddell 09/20/23, noted she was having long pauses of over 4 seconds and feels fatigued. Despite not having frank syncope, felt certain the spells will worsen and recommended PPM Consideration for resuming amio post pacing with efforts to maintain SR  09/21/23 > referred to the ER 2/2 further pauses on her monitor 6 seconds (though nocturnal) Underwent PPM implant 09/22/23  Discharged 09/23/23 Instructed to resume Eliquis  09/28/23  Post discharge numerous telephone note with cards and device clinic [facility]  dosing questions Unclear why though remote transmission was requested via the clinic >  Concerns of possible VT of unclear duration  Pt was brought in 09/29/22 seen by device clinic RN and Dr. Fernande Testing demonstrated no atrial capture even at maximum outputs and intermittent atrial sensing. VA dissociation was noted at times; electrocardiogram demonstrated a QRS morphology exceedingly low close to sinus suggestive of excellent conduction system pacing with good thresholds and adequate sensing.  XRAY not done until 10/02/22 IMPRESSION: Right-sided pacing device with leads projecting over right atrium and right ventricle. Decreased cardiomegaly and small  pleural effusions.  In MY review, leads appear largely unchanged in position  10/02/22 : cards further addressed volume/meds/lytes  As above, chest x-ray was ordered  Today's visit is scheduled as a wound check visit  ROS:   *** BMEt today via [place] *** site *** eliquis  *** remaining restrictions *** acute outputs *** amio? metoprolol    Device information MDT dual chamber PPM implanted 09/22/23  Arrhythmia/AAD hx Amiodarone  started April 2023 >> qyuickly discontinued 2/2 feeling poorly and labile INRs  Studies Reviewed: SABRA    EKG not done today  DEVICE interrogation done today and reviewed by myself *** Battery and lead measurements are good ***   11/03/22: TTE 1. Left ventricular ejection fraction, by estimation, is 60 to 65%. The  left ventricle has normal function. The left ventricle has no regional  wall motion abnormalities. Left ventricular diastolic function could not  be evaluated.   2. Right ventricular systolic function is moderately reduced. The right  ventricular size is mildly enlarged. There is mildly elevated pulmonary  artery systolic pressure.   3. Left atrial size was severely dilated.   4. Right atrial size was moderately dilated.   5. The mitral valve has been repaired/replaced. No evidence of mitral  valve regurgitation. The mean mitral valve gradient is 4.0 mmHg. There is  a 29 mm Medtronic bioprosthetic valve present in the mitral position. Echo  findings are consistent with  normal structure and function of the mitral valve prosthesis.   6. Tricuspid valve regurgitation is moderate.   7. The aortic valve is tricuspid. There is moderate calcification of the  aortic valve. There is moderate thickening of the aortic  valve. Aortic  valve regurgitation is not visualized. Mild aortic valve stenosis. Aortic  valve area, by VTI measures 2.20  cm. Aortic valve mean gradient measures 11.0 mmHg. Aortic valve Vmax  measures 2.23 m/s.   8. The  inferior vena cava is dilated in size with >50% respiratory  variability, suggesting right atrial pressure of 8 mmHg.   9. Cannot exclude a small PFO.   Comparison(s): No significant change from prior study.    Risk Assessment/Calculations:    Physical Exam:   VS:  There were no vitals taken for this visit.   Wt Readings from Last 3 Encounters:  10/03/23 126 lb (57.2 kg)  09/22/23 126 lb 5.2 oz (57.3 kg)  09/20/23 128 lb 12.8 oz (58.4 kg)    GEN: Well nourished, well developed in no acute distress NECK: No JVD; No carotid bruits CARDIAC: ***RRR, no murmurs, rubs, gallops RESPIRATORY:  *** CTA b/l without rales, wheezing or rhonchi  ABDOMEN: Soft, non-tender, non-distended EXTREMITIES:  No edema; No deformity   PPM site: *** is stable, no thinning, fluctuation, tethering  ASSESSMENT AND PLAN: .    PPM *** Intact function *** no programming changes made *** acute implant outputs remain  Persistent AFib CHA2DS2Vasc is 6, on Eliquis , *** appropriately dosed *** % burden ***  VHD Rheumatic  > MVR (bioprosthetic) > functioining well on her last echo Mod TR C/w Dr. Chana  CAD *** C/w Dr. Chana  Secondary hypercoagulable state 2/2 AFib     {Are you ordering a CV Procedure (e.g. stress test, cath, DCCV, TEE, etc)?   Press F2        :[id number]}     Dispo: ***  Signed, Charlies Macario Arthur, PA-C

## 2023-10-07 NOTE — Patient Instructions (Signed)
 Medication Instructions:  Your physician recommends that you continue on your current medications as directed. Please refer to the Current Medication list given to you today.  *If you need a refill on your cardiac medications before your next appointment, please call your pharmacy*  Lab Work: None ordered If you have labs (blood work) drawn today and your tests are completely normal, you will receive your results only by: MyChart Message (if you have MyChart) OR A paper copy in the mail If you have any lab test that is abnormal or we need to change your treatment, we will call you to review the results.  Follow-Up: At [facility], you and your health needs are our priority.  As part of our continuing mission to provide you with exceptional heart care, we have created designated Provider Care Teams.  These Care Teams include your primary Cardiologist (physician) and Advanced Practice Providers (APPs -  Physician Assistants and Nurse Practitioners) who all work together to provide you with the care you need, when you need it.  We recommend signing up for the patient portal called "MyChart".  Sign up information is provided on this After Visit Summary.  MyChart is used to connect with patients for Virtual Visits (Telemedicine).  Patients are able to view lab/test results, encounter notes, upcoming appointments, etc.  Non-urgent messages can be sent to your provider as well.   To learn more about what you can do with MyChart, go to [email or web address].    Your next appointment:   As scheduled

## 2023-10-08 ENCOUNTER — Encounter (HOSPITAL_BASED_OUTPATIENT_CLINIC_OR_DEPARTMENT_OTHER): Payer: Self-pay | Admitting: Family

## 2023-10-10 ENCOUNTER — Ambulatory Visit (INDEPENDENT_AMBULATORY_CARE_PROVIDER_SITE_OTHER): Payer: Medicare Other | Admitting: Family Medicine

## 2023-10-10 ENCOUNTER — Ambulatory Visit: Payer: Medicare Other | Admitting: Cardiology

## 2023-10-10 ENCOUNTER — Encounter: Payer: Self-pay | Admitting: Family Medicine

## 2023-10-10 VITALS — BP 108/58 | HR 80 | Temp 98.4°F | Wt 119.0 lb

## 2023-10-10 DIAGNOSIS — B3731 Acute candidiasis of vulva and vagina: Secondary | ICD-10-CM

## 2023-10-10 MED ORDER — CLOTRIMAZOLE-BETAMETHASONE 1-0.05 % EX CREA: 1.0000 | TOPICAL_CREAM | Freq: Two times a day (BID) | CUTANEOUS | 2 refills | Status: DC

## 2023-10-10 NOTE — Progress Notes (Signed)
   Subjective:    Patient ID: Catherine [name], female    DOB: 1935-08-11, 89 y.o.   MRN: [id number]  HPI Here to follow up on a vaginal yeast infection. She likely developed this after she was given antibiotics for a recent procedure. On 10-04-23 she was given a Fluconazole  150 mg tablet by Dr. Micheal. This did not help so she went to urgent care on 10-06-23 for this. They treated her with 3 days of Fluconazole  100 mg and Nystatin cream. This seems to be helping but she still has a lot of itching and burning in the perineal area. No DC.    Review of Systems  Constitutional: Negative.   Respiratory: Negative.    Cardiovascular: Negative.   Genitourinary:  Negative for vaginal bleeding and vaginal discharge.       Objective:   Physical Exam Constitutional:      Appearance: Normal appearance.  Cardiovascular:     Rate and Rhythm: Normal rate and regular rhythm.     Pulses: Normal pulses.     Heart sounds: Normal heart sounds.  Pulmonary:     Effort: Pulmonary effort is normal.     Breath sounds: Normal breath sounds.  Neurological:     Mental Status: She is alert.           Assessment & Plan:  Partially treated vaginal yeast infection. She will stop the Nystatin cream and instead will apply Lotrisone  cream BID. Recheck as needed. Garnette Olmsted, MD

## 2023-10-11 NOTE — Telephone Encounter (Signed)
 Spoke with patient.  She is not willing to do Repatha/Praluent, refuses to do shots at home.  Suggested Nexletol 180 mg daily.   She states no history of gout.  Will submit to insurance for PA

## 2023-10-12 ENCOUNTER — Telehealth: Payer: Self-pay | Admitting: Cardiovascular Disease

## 2023-10-12 ENCOUNTER — Other Ambulatory Visit: Payer: Self-pay

## 2023-10-12 NOTE — Telephone Encounter (Signed)
 Called and spoke to patient. Discussed NP's recommendation to take Bumex  every other day and to follow up with daily weights. She verbalized understanding.

## 2023-10-12 NOTE — Telephone Encounter (Signed)
 Called and spoke to patient.  Pt concerned of loss of appetite and loss of 10 lbs from 1/1. Did not take Bumex  today and wants to know if she can change Bumex  to every other day. Pt understands uses and necessity of Bumex  but would still like a provider's opinion. Also stated she had concerns of yeast infection. She has gone to PCP and urgent care - taking antifungal cream and feels better. Will see PCP next Wednesday - suggested letting know PCP know of loss of appetite as well.   Will route to MD/APP for review. She verbalized understanding.

## 2023-10-12 NOTE — Telephone Encounter (Signed)
 Pt c/o medication issue:  1. Name of Medication:   bumetanide  (BUMEX ) 2 MG tablet    2. How are you currently taking this medication (dosage and times per day)? Take 2 mg by mouth daily.   3. Are you having a reaction (difficulty breathing--STAT)? No  4. What is your medication issue? Pt requesting cb to discuss losing weight, no appetite

## 2023-10-12 NOTE — Patient Outreach (Signed)
 Care Management  Transitions of Care Program Transitions of Care Post-discharge week 3   10/12/2023 Name: Catherine [name] MRN: [id number] DOB: 04-02-1935  Subjective: Catherine [name] is a 89 y.o. year old female who is a primary care patient of Catherine [name], Catherine Shouts, MD. The Care Management team Engaged with patient Engaged with patient by telephone to assess and address transitions of care needs.   Consent to Services:  Patient was given information about care management services, agreed to services, and gave verbal consent to participate.   Assessment: Patient reports worst yeast infection in her life. Went to urgent care and was RX a cream and fluconazole  for 3 days.  Patient reports that she saw Dr. Alyne [name] an additional cream. Patient reports that she continues to have burning and itching.  Patient reports that she thinks the cream is helping.  Reports she has lost 10 pounds since Jan 1.  Reports she is not hungry.  Reports she drinks  1 boost daily.   Reports that she occasionally feels like her heart is beating fast.  Has a [employer] monitoring but is unsure if it is working correctly.          SDOH Interventions    Flowsheet Row Telephone from 09/26/2023 in [place] [facility] Telephone from 09/14/2023 in [place] [facility]PART[facility] Care Coordination from 12/22/2022 in [facility] Clinical Support from 04/09/2022 in [facility] [place] Chronic Care Management from 11/06/2021 in [facility] [place] Clinical Support from 04/07/2021 in [facility] [place]  SDOH Interventions        Food Insecurity Interventions Intervention Not Indicated Intervention Not Indicated Intervention Not Indicated Intervention Not Indicated Intervention Not Indicated Intervention Not Indicated  Housing Interventions Intervention Not Indicated Intervention Not Indicated Intervention Not Indicated  Intervention Not Indicated Intervention Not Indicated Intervention Not Indicated  Transportation Interventions Intervention Not Indicated, Patient Resources (Friends/Family) Intervention Not Indicated, Patient Resources (Friends/Family) -- Intervention Not Indicated Intervention Not Indicated --  Utilities Interventions -- Intervention Not Indicated -- -- -- --  Financial Strain Interventions -- -- -- Intervention Not Indicated Intervention Not Indicated --  Physical Activity Interventions -- -- -- Intervention Not Indicated Other (Comments)  [reviewed Silver Sneakers when able] Intervention Not Indicated  Stress Interventions -- -- -- Intervention Not Indicated Intervention Not Indicated --  Social Connections Interventions -- -- -- Intervention Not Indicated -- Intervention Not Indicated        Goals Addressed             This Visit's Progress    TOC Care Plan       Current Barriers:  Knowledge Deficits related to plan of care for management of Sick Sinus Syndrome/Pacemaker insertion 10/12/2023  Patient reports she occasionally feels an irregular heart beat. Reports that she received a letter in the mail stating that her monitored needs to be update.  Yeast infection.  10/12/2023  Patient reports worst yeast infection in her lift. Continues to have burning and itching.   RNCM Clinical Goal(s):  Patient will work with the Care Management team over the next 30 days to address Transition of Care Barriers: Medication Management take all medications exactly as prescribed and will call provider for medication related questions as evidenced by EMR attend all scheduled medical appointments: PCP and Specialists as evidenced by EMR  through collaboration with RN Care manager, provider, and care team.   Interventions: Evaluation of current treatment plan related to  self management and patient's adherence to plan as established by provider  Transitions of Care:  Goal on track:  Yes. Assessed  for any changes in condition- Reviewed EMR and patient update about yeast infection. Encouraged patient to use cool compressed. Reviewed use of yogurt..  Assessed nutritional status-pt reports decreased appetite-"everything tastes bland"-drinking Boost about 1-2x/day, pt has scale-weighing daily-wgt today-113 pounds today. Reports she did not take her bumex  today. Encouraged patient to call cardiology for guidance Discussed pain mgmt-pt with chronic back pain-taking Hydrocodone  and Tylenol  as ordered Encouraged patient to call [employer] to inquire about letter sent in the mail Offered support and a listening ear  Patient Goals/Self-Care Activities: Participate in Transition of Care Program/Attend [facility] scheduled calls Notify RN Care Manager of TOC call rescheduling needs Take all medications as prescribed Attend all scheduled provider appointments Call pharmacy for medication refills 3-7 days in advance of running out of medications Monitor wgt daily-notify provider of any abnormal wgt Monitor pacer site for any s/s of infection, report any abnormal cardiac sxs to provider  Follow Up Plan:  Telephone follow up appointment with care management team member scheduled for:  09/2323-noon The patient has been provided with contact information for the care management team and has been advised to call with any health related questions or concerns.    Provided my contact information and name as I am nurse case Production designer, theatre/television/film.        Plan: Telephone follow up appointment with care management team member scheduled for: 10/20/2023   Catherine Blade, RN, BSN, CEN Population Health- Transition of Care Team.  [facility] [phone]

## 2023-10-13 ENCOUNTER — Ambulatory Visit: Payer: Medicare Other

## 2023-10-14 ENCOUNTER — Telehealth (HOSPITAL_BASED_OUTPATIENT_CLINIC_OR_DEPARTMENT_OTHER): Payer: Self-pay | Admitting: Cardiovascular Disease

## 2023-10-14 NOTE — Telephone Encounter (Signed)
Spoke with patient, per patient received letter that Leqvio denied by insurance  Will forward to [employer] D for review

## 2023-10-14 NOTE — Telephone Encounter (Signed)
  Pt c/o medication issue:  1. Name of Medication: Leqvio  2. How are you currently taking this medication (dosage and times per day)? As written   3. Are you having a reaction (difficulty breathing--STAT)?   4. What is your medication issue? Pt said, her pharmacy told her that her insurance denied her Wilber Bihari because they did not received the correct information. She is requesting to speak with Juliette Alcide to discuss

## 2023-10-17 ENCOUNTER — Other Ambulatory Visit: Payer: Self-pay | Admitting: Family Medicine

## 2023-10-17 NOTE — Telephone Encounter (Signed)
Spoke with patient [employer] preference is for [employer], will only cover Leqvio if failed that.  Patient not interested in [employer]/self injections.  States cannot do it herself and nobody in family to help.  Will hold of on lipid lowering for now.  Patient aware.

## 2023-10-19 ENCOUNTER — Encounter: Payer: Self-pay | Admitting: Family Medicine

## 2023-10-19 ENCOUNTER — Ambulatory Visit: Payer: Medicare Other | Admitting: Family Medicine

## 2023-10-19 VITALS — BP 124/66 | HR 88 | Temp 98.2°F | Wt 118.9 lb

## 2023-10-19 DIAGNOSIS — R634 Abnormal weight loss: Secondary | ICD-10-CM

## 2023-10-19 DIAGNOSIS — B3731 Acute candidiasis of vulva and vagina: Secondary | ICD-10-CM

## 2023-10-19 DIAGNOSIS — I4891 Unspecified atrial fibrillation: Secondary | ICD-10-CM

## 2023-10-19 DIAGNOSIS — R0981 Nasal congestion: Secondary | ICD-10-CM | POA: Diagnosis not present

## 2023-10-19 MED ORDER — FLUCONAZOLE 100 MG PO TABS: 100.0000 mg | ORAL_TABLET | Freq: Every day | ORAL | 0 refills | Status: DC

## 2023-10-19 NOTE — Progress Notes (Signed)
Established Patient Office Visit  Subjective   Patient ID: Catherine [name], female    DOB: [birth year]  Age: 89 y.o. MRN: [id number]  Chief Complaint  Patient presents with   Medical Management of Chronic Issues    HPI   Catherine [name] is seen with ongoing irritation in her vagina region.  She has had significant erythema and pruritus.  She had called regarding this on 6 January and fluconazole was called in-- 150 mg 1 dose.  This usually has helped in the past.  Her symptoms persisted and she went to urgent care on the ninth and was examined and had some ongoing erythema and was given nystatin cream along with fluconazole 100 mg daily for 3 days.  Her symptoms persisted and she was seen here on the 13th and prescribed Lotrisone cream which did help some with erythema but she has had some ongoing irritation.  No recent antibiotics.  No recent steroids.  No history of diabetes.  Has had poor nutrition with declining appetite.  Currently using boost twice per day.  She had recent pacemaker placed and is recovering from that.  No chest pains.  No dizziness.  She has had some recent ongoing nasal congestion symptoms especially at night.  No purulent secretions.  Longstanding history of hyperlipidemia.  Intolerance with multiple statins.  Cardiology has been working with her trying to get coverage for injectable-presumably inclisiran  Past Medical History:  Diagnosis Date   Acute on chronic diastolic heart failure (HCC)    AKI (acute kidney injury) (HCC) 06/22/2022   Aortic stenosis, mild 07/26/2017   Arthritis    Asthma    last attack 02/2015   Atrial fibrillation, chronic (HCC)    Cellulitis of left lower extremity 08/17/2016   Ulcer associated with severe venous insufficiency   Chronic anticoagulation    Chronic diastolic CHF (congestive heart failure) (HCC)    Chronic kidney disease    "RIGHT MANY KIDNEY INFECTIONS AND STONES"   COPD (chronic obstructive pulmonary disease) (HCC)     Coronary artery disease    Dizziness    H/O: rheumatic fever    Heart murmur    Hypertension    PONV (postoperative nausea and vomiting)    ' SOMETIMES', BUT NOT ALWAYS"   Primary hypertension    RHEUMATIC MITRAL STENOSIS 01/05/2008   Qualifier: Diagnosis of   By: Maple Hudson MD, Clinton D        S/P Maze operation for atrial fibrillation 10/14/2016   Complete bilateral atrial lesion set using cryothermy and bipolar radiofrequency ablation - atrial appendage was not treated due to previous surgical procedure (open mitral commissurotomy)   S/P mitral valve replacement with bioprosthetic valve 10/14/2016   29 mm Medtronic Mosaic porcine bioprosthetic tissue valve   Sciatica 08/29/2023   Tricuspid regurgitation 07/14/2021   UTI (urinary tract infection)    Valvular heart disease    Has mitral stenosis with prior mitral commissurotomy in 1970   Past Surgical History:  Procedure Laterality Date   ABDOMINAL HYSTERECTOMY  1983   endometriosis   APPENDECTOMY     BACK SURGERY     neurosurgery x2   CARDIAC CATHETERIZATION     CARDIAC CATHETERIZATION N/A 08/03/2016   Procedure: Right/Left Heart Cath and Coronary Angiography;  Surgeon: Peter M Swaziland, MD;  Location: [facility] INVASIVE CV LAB;  Service: Cardiovascular;  Laterality: N/A;   cataract surg     CHOLECYSTECTOMY N/A 05/30/2015   Procedure: LAPAROSCOPIC CHOLECYSTECTOMY WITH INTRAOPERATIVE CHOLANGIOGRAM;  Surgeon: Glenna Fellows, MD;  Location: [facility] OR;  Service: General;  Laterality: N/A;   COLONOSCOPY     CORONARY ARTERY BYPASS GRAFT N/A 10/14/2016   Procedure: CORONARY ARTERY BYPASS GRAFTING (CABG);  Surgeon: Purcell Nails, MD;  Location: [facility] OR;  Service: Open Heart Surgery;  Laterality: N/A;   EYE SURGERY     MAZE N/A 10/14/2016   Procedure: MAZE;  Surgeon: Purcell Nails, MD;  Location: [facility] OR;  Service: Open Heart Surgery;  Laterality: N/A;   MITRAL VALVE REPLACEMENT N/A 10/14/2016   Procedure: REDO MITRAL VALVE REPLACEMENT (MVR);   Surgeon: Purcell Nails, MD;  Location: [facility] OR;  Service: Open Heart Surgery;  Laterality: N/A;   MITRAL VALVE SURGERY Left 1970   Open mitral commissurotomy via left thoracotomy approach   PACEMAKER IMPLANT N/A 09/22/2023   Procedure: PACEMAKER IMPLANT;  Surgeon: [name], MD;  Location: [facility] INVASIVE CV LAB;  Service: Cardiovascular;  Laterality: N/A;   TEE WITHOUT CARDIOVERSION N/A 07/05/2016   Procedure: TRANSESOPHAGEAL ECHOCARDIOGRAM (TEE);  Surgeon: Chilton Si, MD;  Location: [facility] ENDOSCOPY;  Service: Cardiovascular;  Laterality: N/A;   TEE WITHOUT CARDIOVERSION N/A 10/14/2016   Procedure: TRANSESOPHAGEAL ECHOCARDIOGRAM (TEE);  Surgeon: Purcell Nails, MD;  Location: [facility] OR;  Service: Open Heart Surgery;  Laterality: N/A;    reports that she quit smoking about 48 years ago. Her smoking use included cigarettes. She started smoking about 63 years ago. She has a 15 pack-year smoking history. She has never used smokeless tobacco. She reports that she does not drink alcohol and does not use drugs. family history includes Leukemia in her father. Allergies  Allergen Reactions   Aldactone [Spironolactone] Other (See Comments)    dyspnea   Amoxicillin Palpitations    Tachycardia   Amlodipine Other (See Comments)    Low extremity edema   Crestor [Rosuvastatin] Other (See Comments)    myalgia   Diltiazem Other (See Comments)    Causing headaches    Flagyl [Metronidazole Hcl] Other (See Comments)    Causing headaches     Flovent [Fluticasone Propionate] Other (See Comments)    Leg cramps   Gabapentin Swelling   Livalo [Pitavastatin] Other (See Comments)    myalgias   Lyrica [Pregabalin] Swelling   Quinidine Diarrhea and Other (See Comments)    Fever diarrhea   Simvastatin Other (See Comments)    Leg pain, myalgia   Tramadol Nausea Only   Verapamil Other (See Comments)    myalgias   Zetia [Ezetimibe] Other (See Comments)    Myalgia   Benazepril Hcl Cough   Ciprofloxacin  Diarrhea   Codeine Nausea Only   Nitrofurantoin Monohyd Macro Nausea Only   Polymyxin B-Trimethoprim Swelling    Review of Systems  Constitutional:  Negative for chills and fever.  HENT:  Positive for congestion. Negative for sinus pain.   Respiratory:  Negative for shortness of breath.   Cardiovascular:  Negative for chest pain.  Genitourinary:  Negative for dysuria.  Skin:  Positive for itching and rash.      Objective:     BP 124/66 (BP Location: Left Arm, Patient Position: Sitting, Cuff Size: Normal)   Pulse 88   Temp 98.2 F (36.8 C) (Oral)   Wt 118 lb 14.4 oz (53.9 kg)   SpO2 97%   BMI 21.06 kg/m  BP Readings from Last 3 Encounters:  10/19/23 124/66  10/10/23 (!) 108/58  10/03/23 130/62   Wt Readings from Last 3 Encounters:  10/19/23 118 lb 14.4 oz (53.9 kg)  10/12/23 113 lb (51.3 kg)  10/10/23 119 lb (54 kg)      Physical Exam Vitals reviewed.  Constitutional:      General: She is not in acute distress.    Appearance: She is not ill-appearing.  Cardiovascular:     Rate and Rhythm: Normal rate and regular rhythm.  Pulmonary:     Effort: Pulmonary effort is normal.     Breath sounds: Normal breath sounds.  Musculoskeletal:     Right lower leg: No edema.     Left lower leg: No edema.  Neurological:     Mental Status: She is alert.      No results found for any visits on 10/19/23.  Last CBC Lab Results  Component Value Date   WBC 10.5 09/23/2023   HGB 11.0 (L) 09/23/2023   HCT 32.4 (L) 09/23/2023   MCV 89.5 09/23/2023   MCH 30.4 09/23/2023   RDW 13.2 09/23/2023   PLT 168 09/23/2023   Last metabolic panel Lab Results  Component Value Date   GLUCOSE 108 (H) 09/30/2023   NA 142 09/30/2023   K 3.4 (L) 09/30/2023   CL 100 09/30/2023   CO2 30 (H) 09/30/2023   BUN 12 09/30/2023   CREATININE 0.75 09/30/2023   EGFR 77 09/30/2023   CALCIUM 9.3 09/30/2023   PHOS 4.0 09/11/2023   PROT 5.1 (L) 09/13/2023   ALBUMIN 2.8 (L) 09/13/2023    LABGLOB 1.9 06/14/2022   AGRATIO 2.3 (H) 06/14/2022   BILITOT 0.4 09/13/2023   ALKPHOS 53 09/13/2023   AST 22 09/13/2023   ALT 11 09/13/2023   ANIONGAP 6 09/23/2023   Last lipids Lab Results  Component Value Date   CHOL 240 (H) 08/31/2023   HDL 41.90 08/31/2023   LDLCALC 157 (H) 08/31/2023   LDLDIRECT 68.0 09/24/2019   TRIG 208.0 (H) 08/31/2023   CHOLHDL 6 08/31/2023      The ASCVD Risk score (Arnett DK, et al., 2019) failed to calculate for the following reasons:   The 2019 ASCVD risk score is only valid for ages 107 to 50    Assessment & Plan:   #1 yeast vaginitis.  She has had ongoing problems with this in the past.  No recent antibiotic or steroid use.  No history of diabetes.  Did not respond to short course of fluconazole.  Recommend fluconazole 100 mg daily for 7 days.  Continue to keep area dry as possible.  Recommend loose fitting garments especially at night when she is at home.  Limit Lotrisone cream to short-term use.  #2 nasal congestion.  Probably exacerbated by dry air.  Consider nasal saline irrigation.  Consider Nasacort AQ  #3 history of atrial fibrillation.  Previously on Coumadin.  Now on Eliquis  #4 history of weight loss with poor appetite.  Currently using boost.  We made some suggestions regarding healthy options to increase protein and nutrient intake. Evelena Peat, MD

## 2023-10-20 ENCOUNTER — Other Ambulatory Visit: Payer: Self-pay

## 2023-10-20 NOTE — Patient Outreach (Signed)
Care Management  Transitions of Care Program Transitions of Care Post-discharge week 4   10/20/2023 Name: Catherine [name] MRN: [id number] DOB: April 09, 1935  Subjective: Catherine [name] is a 89 y.o. year old female who is a primary care patient of Burchette, Elberta Fortis, MD. The Care Management team Engaged with patient Engaged with patient by telephone to assess and address transitions of care needs.   Consent to Services:  Patient was given information about care management services, agreed to services, and gave verbal consent to participate.   Assessment: Reports that she saw PCP yesterday.  Reports that is some better. RX for an additional 7 days of treatment.  Weight 114 pounds.  Reports eating yogurt and cottage cheese. Drink the boost.  Report appetite is 50% better.           SDOH Interventions    Flowsheet Row Telephone from 09/26/2023 in [place] POPULATION HEALTH DEPARTMENT Telephone from 09/14/2023 in [place] POPULATION HEALTH DEPARTMENT Care Coordination from 12/22/2022 in [facility] Clinical Support from 04/09/2022 in [facility] at [place] Chronic Care Management from 11/06/2021 in [facility] at [place] Clinical Support from 04/07/2021 in [facility] at [place]  SDOH Interventions        Food Insecurity Interventions Intervention Not Indicated Intervention Not Indicated Intervention Not Indicated Intervention Not Indicated Intervention Not Indicated Intervention Not Indicated  Housing Interventions Intervention Not Indicated Intervention Not Indicated Intervention Not Indicated Intervention Not Indicated Intervention Not Indicated Intervention Not Indicated  Transportation Interventions Intervention Not Indicated, Patient Resources (Friends/Family) Intervention Not Indicated, Patient Resources (Friends/Family) -- Intervention Not Indicated Intervention Not Indicated --  Utilities Interventions --  Intervention Not Indicated -- -- -- --  Financial Strain Interventions -- -- -- Intervention Not Indicated Intervention Not Indicated --  Physical Activity Interventions -- -- -- Intervention Not Indicated Other (Comments)  [reviewed Silver Sneakers when able] Intervention Not Indicated  Stress Interventions -- -- -- Intervention Not Indicated Intervention Not Indicated --  Social Connections Interventions -- -- -- Intervention Not Indicated -- Intervention Not Indicated        Goals Addressed             This Visit's Progress    COMPLETED: TOC Care Plan       Current Barriers:  Knowledge Deficits related to plan of care for management of Sick Sinus Syndrome/Pacemaker insertion 10/12/2023  Patient reports she occasionally feels an irregular heart beat. Reports that she received a letter in the mail stating that her monitored needs to be update. 10/20/2023.  Patient reports that she confirmed with her daughter that her account is set up and everything is working fine. Yeast infection.  10/12/2023  Patient reports worst yeast infection in her lift. Continues to have burning and itching. 10/20/2023  Patient reports decrease in her yeast infection  and states that she saw MD yesterday and that she will take an additional 7 days of fluconazole. Reports that she is eating yogurt and cottage cheese. Reports that her appetite is better and that she is eating and drinking much better.  Reports weight is unchanged.  Denies any additional concerns. Very appreciative of calls.   RNCM Clinical Goal(s):  Patient will work with the Care Management team over the next 30 days to address Transition of Care Barriers: Medication Management take all medications exactly as prescribed and will call provider for medication related questions as evidenced by EMR attend all scheduled medical appointments: PCP and Specialists  as evidenced by EMR  through collaboration with RN Care manager, provider, and care team.    Interventions: Evaluation of current treatment plan related to  self management and patient's adherence to plan as established by provider  Transitions of Care:  Patient declined further engagement on this goal. Assessed for any changes in condition Offered support and a listening ear Reviewed TOC program and additional calls. Patient reports that she is doing well and that she does not need any additional calls. Ensured patient has my contact information and she will call me if needed.   Patient Goals/Self-Care Activities: Take all medications as prescribed Attend all scheduled provider appointments Call pharmacy for medication refills 3-7 days in advance of running out of medications Call MD for any additional concerns Monitor wgt daily-notify provider of any abnormal wgt Monitor pacer site for any s/s of infection, report any abnormal cardiac sxs to provider  Follow Up Plan:  The patient has been provided with contact information for the care management team and has been advised to call with any health related questions or concerns.    Provided my contact information and name as I am nurse case Production designer, theatre/television/film.        Plan: The patient has been provided with contact information for the care management team and has been advised to call with any health related questions or concerns.    Lonia Chimera, RN, BSN, CEN [employer]- Transition of Care Team.  [facility] [phone]

## 2023-10-20 NOTE — Patient Instructions (Signed)
Visit Information  Thank you for taking time to visit with me today. Please don't hesitate to contact me if I can be of assistance to you before our next scheduled telephone appointment.  Following are the goals we discussed today:   Goals Addressed             This Visit's Progress    COMPLETED: TOC Care Plan       Current Barriers:  Knowledge Deficits related to plan of care for management of Sick Sinus Syndrome/Pacemaker insertion 10/12/2023  Patient reports she occasionally feels an irregular heart beat. Reports that she received a letter in the mail stating that her monitored needs to be update. 10/20/2023.  Patient reports that she confirmed with her daughter that her account is set up and everything is working fine. Yeast infection.  10/12/2023  Patient reports worst yeast infection in her lift. Continues to have burning and itching. 10/20/2023  Patient reports decrease in her yeast infection  and states that she saw MD yesterday and that she will take an additional 7 days of fluconazole. Reports that she is eating yogurt and cottage cheese. Reports that her appetite is better and that she is eating and drinking much better.  Reports weight is unchanged.  Denies any additional concerns. Very appreciative of calls.   RNCM Clinical Goal(s):  Patient will work with the Care Management team over the next 30 days to address Transition of Care Barriers: Medication Management take all medications exactly as prescribed and will call provider for medication related questions as evidenced by EMR attend all scheduled medical appointments: PCP and Specialists as evidenced by EMR  through collaboration with RN Care manager, provider, and care team.   Interventions: Evaluation of current treatment plan related to  self management and patient's adherence to plan as established by provider  Transitions of Care:  Patient declined further engagement on this goal. Assessed for any changes in  condition Offered support and a listening ear Reviewed TOC program and additional calls. Patient reports that she is doing well and that she does not need any additional calls. Ensured patient has my contact information and she will call me if needed.   Patient Goals/Self-Care Activities: Take all medications as prescribed Attend all scheduled provider appointments Call pharmacy for medication refills 3-7 days in advance of running out of medications Call MD for any additional concerns Monitor wgt daily-notify provider of any abnormal wgt Monitor pacer site for any s/s of infection, report any abnormal cardiac sxs to provider  Follow Up Plan:  The patient has been provided with contact information for the care management team and has been advised to call with any health related questions or concerns.    Provided my contact information and name as I am nurse case Production designer, theatre/television/film.        If you are experiencing a Mental Health or Behavioral Health Crisis or need someone to talk to, please call 911   Patient verbalizes understanding of instructions and care plan provided today and agrees to view in MyChart. Active MyChart status and patient understanding of how to access instructions and care plan via MyChart confirmed with patient.     Lonia Chimera, RN, BSN, CEN [employer]- Transition of Care Team.  [facility] [phone]

## 2023-10-21 ENCOUNTER — Other Ambulatory Visit: Payer: Self-pay | Admitting: Family Medicine

## 2023-10-21 ENCOUNTER — Telehealth (HOSPITAL_BASED_OUTPATIENT_CLINIC_OR_DEPARTMENT_OTHER): Payer: Self-pay | Admitting: Cardiovascular Disease

## 2023-10-21 NOTE — Telephone Encounter (Signed)
Follow Up:     Patient would like for [facility] to please give her a call. A call. She said it is concerning her call on 10-14-23, about her medicine.

## 2023-10-21 NOTE — Telephone Encounter (Signed)
Spoke with pt, she reports her insurance is looking for more information for approving the leqvio. Aware found paperwork in her chart and I have faxed information to them to start an appeals process.

## 2023-10-25 ENCOUNTER — Other Ambulatory Visit: Payer: Self-pay | Admitting: Pharmacist Clinician (PhC)/ Clinical Pharmacy Specialist

## 2023-10-25 ENCOUNTER — Telehealth: Payer: Self-pay

## 2023-10-25 NOTE — Patient Outreach (Signed)
  Care Coordination   Follow Up Visit Note   10/25/2023 Name: Catherine [name] MRN: [id number] DOB: 11/11/1934  Catherine [name] is a 89 y.o. year old female who sees Burchette, Elberta Fortis, MD for primary care. I spoke with  Catherine [name] by phone today.  What matters to the patients health and wellness today?  Patient called and left a VM.  Returned called. Patient reports that she continues to have pain at the insertion site of her pacemaker.  Reports that she takes tylenol and hydrocodone for pain daily ( due to her back) Patient worried about her continued pain.       SDOH assessments and interventions completed:  No  .    Care Coordination Interventions:  Yes, provided    Reviewed with patient that typically patients have pain for several week up to months. Reviewed with patient that everyone heals differently.  Patient denies any redness or warmth.  Encouraged patient to call her MD if she continues to have concerns.    Follow up plan: No further intervention required.   Encounter Outcome:  Patient Visit Completed   Lonia Chimera, RN, BSN, CEN Population Health- Transition of Care Team.  [facility] [phone]

## 2023-10-26 ENCOUNTER — Telehealth: Payer: Self-pay | Admitting: Cardiology

## 2023-10-26 NOTE — Telephone Encounter (Signed)
Patient called and said that since she had her pacemaker put in, she has been having pain everytime she breathes. Want to know if that is normal or something to be concerned about

## 2023-10-27 ENCOUNTER — Telehealth: Payer: Self-pay | Admitting: Cardiovascular Disease

## 2023-10-27 NOTE — Telephone Encounter (Signed)
Patient is requesting to speak with RN Juliette Alcide in regard to her medications. Please advise.

## 2023-10-27 NOTE — Telephone Encounter (Signed)
Patient called, reports of pain at direct side of pacemaker x2 days, pain stopped yesterday evening. Denies any redness, swelling or drainage at ppm site. Patient advised remote transmission received and reviewed. Normal device function. Advised continue to monitor and call if she has further questions or concerns. PT voiced understanding and appreciative of call.

## 2023-10-27 NOTE — Telephone Encounter (Signed)
Spoke with patient, advised Eliquis denied secondary to it being covered by insurance,  Advised patient

## 2023-10-27 NOTE — Telephone Encounter (Signed)
Spoke with patient, advised Eliquis denied secondary to it being covered by insurance,  Advised patient   Spoke with patient regarding cholesterol medications  Per patient she received call this that Catherine [name] was approved Will forward to [employer] D for review

## 2023-11-01 ENCOUNTER — Ambulatory Visit (HOSPITAL_BASED_OUTPATIENT_CLINIC_OR_DEPARTMENT_OTHER): Payer: Medicare Other

## 2023-11-01 DIAGNOSIS — I5043 Acute on chronic combined systolic (congestive) and diastolic (congestive) heart failure: Secondary | ICD-10-CM

## 2023-11-02 LAB — ECHOCARDIOGRAM COMPLETE
AR max vel: 0.79 cm2
AV Area VTI: 0.85 cm2
AV Area mean vel: 0.74 cm2
AV Mean grad: 15.3 mm[Hg]
AV Peak grad: 30 mm[Hg]
Ao pk vel: 2.74 m/s
Area-P 1/2: 1.62 cm2
MV M vel: 1.81 m/s
MV Peak grad: 13.1 mm[Hg]
P 1/2 time: 141.5 ms
S' Lateral: 2.88 cm

## 2023-11-04 ENCOUNTER — Other Ambulatory Visit (HOSPITAL_BASED_OUTPATIENT_CLINIC_OR_DEPARTMENT_OTHER): Payer: Self-pay | Admitting: Family

## 2023-11-04 ENCOUNTER — Telehealth (HOSPITAL_BASED_OUTPATIENT_CLINIC_OR_DEPARTMENT_OTHER): Payer: Self-pay

## 2023-11-04 NOTE — Telephone Encounter (Signed)
-----   Message from Reche GORMAN Finder sent at 11/03/2023 12:54 PM EST ----- Echocardiogram normal left heart pumping function. Right heart pumping function mildly reduced. Mitral valve prosthesis working well. Mild to moderate leaking of tricuspid valve. Mild to moderate aortic valve stenosis. Overall similar to previous with only slight progression of aortic stenosis. No changes at this time. Continue optimal BP and volume control and follow up as scheduled.

## 2023-11-04 NOTE — Telephone Encounter (Signed)
 Called and spoke to patient; discussed echo results. She verbalized understanding.

## 2023-11-07 ENCOUNTER — Telehealth: Payer: Self-pay | Admitting: Cardiovascular Disease

## 2023-11-07 NOTE — Telephone Encounter (Signed)
*  STAT* If patient is at the pharmacy, call can be transferred to refill team.   1. Which medications need to be refilled? (please list name of each medication and dose if known)   bumetanide  (BUMEX ) 2 MG tablet    2. Which pharmacy/location (including street and city if local pharmacy) is medication to be sent to?  [facility] [place], [place] - [address]    3. Do they need a 30 day or 90 day supply? 90

## 2023-11-08 ENCOUNTER — Other Ambulatory Visit: Payer: Self-pay

## 2023-11-08 ENCOUNTER — Encounter: Payer: Self-pay | Admitting: Family Medicine

## 2023-11-08 ENCOUNTER — Ambulatory Visit (INDEPENDENT_AMBULATORY_CARE_PROVIDER_SITE_OTHER): Payer: Medicare Other | Admitting: Family Medicine

## 2023-11-08 VITALS — BP 120/74 | HR 101 | Temp 98.3°F | Ht 63.0 in | Wt 118.1 lb

## 2023-11-08 DIAGNOSIS — H6123 Impacted cerumen, bilateral: Secondary | ICD-10-CM | POA: Diagnosis not present

## 2023-11-08 DIAGNOSIS — R040 Epistaxis: Secondary | ICD-10-CM | POA: Diagnosis not present

## 2023-11-08 DIAGNOSIS — N952 Postmenopausal atrophic vaginitis: Secondary | ICD-10-CM | POA: Diagnosis not present

## 2023-11-08 MED ORDER — BUMETANIDE 2 MG PO TABS: 2.0000 mg | ORAL_TABLET | Freq: Every day | ORAL | 3 refills | Status: DC

## 2023-11-08 MED ORDER — MUPIROCIN 2 % EX OINT: 1.0000 | TOPICAL_OINTMENT | Freq: Every day | CUTANEOUS | 0 refills | Status: DC

## 2023-11-08 NOTE — Progress Notes (Signed)
Established Patient Office Visit  Subjective   Patient ID: Catherine [name], female    DOB: 1935-01-18  Age: 89 y.o. MRN: [id number]  Chief Complaint  Patient presents with   Ear Pain    Started a week ago , bilateral ear pressure, left is worse, patient also has a sore spot in her nose     HPI   [name] is seen today for several issues as follows  She has had decreased hearing especially left ear but somewhat right as well.  She has had cerumen impactions in the past.  She does have very narrow canals which makes removal challenging.  She tried applying some Debrox at home and tried irrigation herself but did not see any success with that.  Second issue is recent irritation right naris.  Actually her irritation is more elbow Kiesselbach plexus region.  She is tried some saline nasal irrigation.  Intermittent nosebleeds.  Feels sore in the area of question.  Denies any trauma.  Third issue is she has history of atrophic vaginitis.  Recurrent UTIs.  Currently on estradiol vaginal cream using 3 times weekly.  She is requesting that we refill with the future if possible.  This was prescribed by urologist.  Past Medical History:  Diagnosis Date   Acute on chronic diastolic heart failure (HCC)    AKI (acute kidney injury) (HCC) 06/22/2022   Aortic stenosis, mild 07/26/2017   Arthritis    Asthma    last attack 02/2015   Atrial fibrillation, chronic (HCC)    Cellulitis of left lower extremity 08/17/2016   Ulcer associated with severe venous insufficiency   Chronic anticoagulation    Chronic diastolic CHF (congestive heart failure) (HCC)    Chronic kidney disease    "RIGHT MANY KIDNEY INFECTIONS AND STONES"   COPD (chronic obstructive pulmonary disease) (HCC)    Coronary artery disease    Dizziness    H/O: rheumatic fever    Heart murmur    Hypertension    PONV (postoperative nausea and vomiting)    ' SOMETIMES', BUT NOT ALWAYS"   Primary hypertension    RHEUMATIC MITRAL STENOSIS  01/05/2008   Qualifier: Diagnosis of   By: Maple Hudson MD, Clinton D        S/P Maze operation for atrial fibrillation 10/14/2016   Complete bilateral atrial lesion set using cryothermy and bipolar radiofrequency ablation - atrial appendage was not treated due to previous surgical procedure (open mitral commissurotomy)   S/P mitral valve replacement with bioprosthetic valve 10/14/2016   29 mm Medtronic Mosaic porcine bioprosthetic tissue valve   Sciatica 08/29/2023   Tricuspid regurgitation 07/14/2021   UTI (urinary tract infection)    Valvular heart disease    Has mitral stenosis with prior mitral commissurotomy in 1970   Past Surgical History:  Procedure Laterality Date   ABDOMINAL HYSTERECTOMY  1983   endometriosis   APPENDECTOMY     BACK SURGERY     neurosurgery x2   CARDIAC CATHETERIZATION     CARDIAC CATHETERIZATION N/A 08/03/2016   Procedure: Right/Left Heart Cath and Coronary Angiography;  Surgeon: Peter M Swaziland, MD;  Location: [facility] INVASIVE CV LAB;  Service: Cardiovascular;  Laterality: N/A;   cataract surg     CHOLECYSTECTOMY N/A 05/30/2015   Procedure: LAPAROSCOPIC CHOLECYSTECTOMY WITH INTRAOPERATIVE CHOLANGIOGRAM;  Surgeon: Glenna Fellows, MD;  Location: [facility] OR;  Service: General;  Laterality: N/A;   COLONOSCOPY     CORONARY ARTERY BYPASS GRAFT N/A 10/14/2016   Procedure: CORONARY ARTERY BYPASS  GRAFTING (CABG);  Surgeon: Purcell Nails, MD;  Location: [facility] OR;  Service: Open Heart Surgery;  Laterality: N/A;   EYE SURGERY     MAZE N/A 10/14/2016   Procedure: MAZE;  Surgeon: Purcell Nails, MD;  Location: [facility] OR;  Service: Open Heart Surgery;  Laterality: N/A;   MITRAL VALVE REPLACEMENT N/A 10/14/2016   Procedure: REDO MITRAL VALVE REPLACEMENT (MVR);  Surgeon: Purcell Nails, MD;  Location: [facility] OR;  Service: Open Heart Surgery;  Laterality: N/A;   MITRAL VALVE SURGERY Left 1970   Open mitral commissurotomy via left thoracotomy approach   PACEMAKER IMPLANT N/A 09/22/2023    Procedure: PACEMAKER IMPLANT;  Surgeon: [name], MD;  Location: [facility] INVASIVE CV LAB;  Service: Cardiovascular;  Laterality: N/A;   TEE WITHOUT CARDIOVERSION N/A 07/05/2016   Procedure: TRANSESOPHAGEAL ECHOCARDIOGRAM (TEE);  Surgeon: Chilton Si, MD;  Location: [facility] ENDOSCOPY;  Service: Cardiovascular;  Laterality: N/A;   TEE WITHOUT CARDIOVERSION N/A 10/14/2016   Procedure: TRANSESOPHAGEAL ECHOCARDIOGRAM (TEE);  Surgeon: Purcell Nails, MD;  Location: [facility] OR;  Service: Open Heart Surgery;  Laterality: N/A;    reports that she quit smoking about 48 years ago. Her smoking use included cigarettes. She started smoking about 63 years ago. She has a 15 pack-year smoking history. She has never used smokeless tobacco. She reports that she does not drink alcohol and does not use drugs. family history includes Leukemia in her father. Allergies  Allergen Reactions   Aldactone [Spironolactone] Other (See Comments)    dyspnea   Amoxicillin Palpitations    Tachycardia   Amlodipine Other (See Comments)    Low extremity edema   Crestor [Rosuvastatin] Other (See Comments)    myalgia   Diltiazem Other (See Comments)    Causing headaches    Flagyl [Metronidazole Hcl] Other (See Comments)    Causing headaches     Flovent [Fluticasone Propionate] Other (See Comments)    Leg cramps   Gabapentin Swelling   Livalo [Pitavastatin] Other (See Comments)    myalgias   Lyrica [Pregabalin] Swelling   Quinidine Diarrhea and Other (See Comments)    Fever diarrhea   Simvastatin Other (See Comments)    Leg pain, myalgia   Tramadol Nausea Only   Verapamil Other (See Comments)    myalgias   Zetia [Ezetimibe] Other (See Comments)    Myalgia   Benazepril Hcl Cough   Ciprofloxacin Diarrhea   Codeine Nausea Only   Nitrofurantoin Monohyd Macro Nausea Only   Polymyxin B-Trimethoprim Swelling    Review of Systems  Constitutional:  Negative for chills and fever.  HENT:  Positive for hearing loss and  nosebleeds. Negative for ear discharge and ear pain.   Cardiovascular:  Negative for chest pain.      Objective:     BP 120/74 (BP Location: Left Arm, Patient Position: Sitting, Cuff Size: Normal)   Pulse (!) 101   Temp 98.3 F (36.8 C) (Oral)   Ht 5\' 3"  (1.6 m)   Wt 118 lb 1.6 oz (53.6 kg)   SpO2 94%   BMI 20.92 kg/m  BP Readings from Last 3 Encounters:  11/08/23 120/74  10/19/23 124/66  10/10/23 (!) 108/58   Wt Readings from Last 3 Encounters:  11/08/23 118 lb 1.6 oz (53.6 kg)  10/19/23 118 lb 14.4 oz (53.9 kg)  10/12/23 113 lb (51.3 kg)      Physical Exam Vitals reviewed.  Constitutional:      General: She is not in acute distress.  Appearance: She is not ill-appearing.  HENT:     Ears:     Comments: She has bilateral cerumen impactions.  Very narrow ear canals    Nose:     Comments: Left naris is clear.  Right naris reveals some crusted blood left Kiesselbach plexus region.  No visible ulcerations or lesions. Cardiovascular:     Rate and Rhythm: Normal rate.  Pulmonary:     Effort: Pulmonary effort is normal.     Breath sounds: Normal breath sounds. No wheezing or rales.  Neurological:     Mental Status: She is alert.      No results found for any visits on 11/08/23.    The ASCVD Risk score (Arnett DK, et al., 2019) failed to calculate for the following reasons:   The 2019 ASCVD risk score is only valid for ages 35 to 79    Assessment & Plan:   #1 bilateral cerumen impactions causing hearing loss.  Patient aware of risk of irrigation including pain, bleeding, low risk of eardrum perforation.  She consents to irrigation.  Both ear canals were irrigated with removal of wax in entirety.  She could hear much better afterwards.  No residual wax  #2 right nose irritation.  She has significant dryness little bit of dried clotted blood Kiesselbach plexus region of the right naris.  Recommend humidifier at night.  Continue nasal saline.  Consider small  amount of Bactroban ointment on a Q-tip in the area that is crusted and dry and reassess in 2 weeks  #3 history of atrophic vaginitis.  Refill estradiol vaginal cream as needed Evelena Peat, MD

## 2023-11-08 NOTE — Patient Instructions (Signed)
Consider humidifier for bedroom use at night  Continue with saline intranasal at night  Use the Bactroban on Q-tip intranasally at bedtime.    Set up 2 week follow up.

## 2023-11-09 NOTE — Telephone Encounter (Signed)
Message sent to Kim at Infusion center to be sure they have patient information.

## 2023-11-11 ENCOUNTER — Telehealth: Payer: Self-pay | Admitting: Pharmacy Technician

## 2023-11-11 ENCOUNTER — Other Ambulatory Visit: Payer: Self-pay | Admitting: Pharmacy Technician

## 2023-11-11 NOTE — Telephone Encounter (Signed)
Infusion center aware, will call to schedule

## 2023-11-11 NOTE — Telephone Encounter (Signed)
Patient has been approved and will be scheduled as soon as possible.  Auth Submission: APPROVED Site of care: Site of care: [facility] Payer: [employer] Medication & CPT/J Code(s) submitted: Leqvio (Inclisiran) [id number] Route of submission (phone, fax, portal):  Phone # Fax # Auth type: Buy/Bill PB Units/visits requested: 3 DOSES Reference number: [id number] Approval from: 10/03/23 to 09/26/24    [employer]: Pending [employer] aware

## 2023-11-18 NOTE — Telephone Encounter (Signed)
PA request has been Approved for Leqvio. New Encounter created for follow up. For additional info see Pharmacy Prior Auth telephone encounter from 11/11/23 by kim chambers.

## 2023-11-22 ENCOUNTER — Encounter: Payer: Self-pay | Admitting: Family Medicine

## 2023-11-22 ENCOUNTER — Ambulatory Visit (INDEPENDENT_AMBULATORY_CARE_PROVIDER_SITE_OTHER): Payer: Medicare Other | Admitting: Family Medicine

## 2023-11-22 ENCOUNTER — Telehealth: Payer: Self-pay

## 2023-11-22 ENCOUNTER — Encounter (HOSPITAL_BASED_OUTPATIENT_CLINIC_OR_DEPARTMENT_OTHER): Payer: Self-pay | Admitting: Pulmonary Disease

## 2023-11-22 ENCOUNTER — Ambulatory Visit (HOSPITAL_BASED_OUTPATIENT_CLINIC_OR_DEPARTMENT_OTHER): Payer: Medicare Other | Admitting: Pulmonary Disease

## 2023-11-22 VITALS — BP 122/78 | HR 91 | Ht 63.0 in | Wt 121.1 lb

## 2023-11-22 VITALS — BP 116/52 | HR 72 | Temp 98.4°F | Ht 63.0 in | Wt 120.9 lb

## 2023-11-22 DIAGNOSIS — R6889 Other general symptoms and signs: Secondary | ICD-10-CM

## 2023-11-22 DIAGNOSIS — J4489 Other specified chronic obstructive pulmonary disease: Secondary | ICD-10-CM | POA: Diagnosis not present

## 2023-11-22 DIAGNOSIS — M5441 Lumbago with sciatica, right side: Secondary | ICD-10-CM

## 2023-11-22 DIAGNOSIS — Z2989 Encounter for other specified prophylactic measures: Secondary | ICD-10-CM | POA: Diagnosis not present

## 2023-11-22 DIAGNOSIS — G8929 Other chronic pain: Secondary | ICD-10-CM

## 2023-11-22 DIAGNOSIS — I495 Sick sinus syndrome: Secondary | ICD-10-CM

## 2023-11-22 MED ORDER — FLUCONAZOLE 150 MG PO TABS: ORAL_TABLET | ORAL | 0 refills | Status: DC

## 2023-11-22 MED ORDER — CLINDAMYCIN HCL 300 MG PO CAPS: ORAL_CAPSULE | ORAL | 0 refills | Status: DC

## 2023-11-22 NOTE — Patient Instructions (Signed)
 Continue albuterol as needed

## 2023-11-22 NOTE — Progress Notes (Signed)
 Established Patient Office Visit  Subjective   Patient ID: Catherine [name], female    DOB: 15-Mar-1935  Age: 89 y.o. MRN: [id number]  Chief Complaint  Patient presents with   Follow-up    Pt is here with her spouse to follow up on her L ear.     HPI   [name] is here today to discuss and follow-up several items as follows  Having some ongoing especially left ear symptoms.  She feels like there is some pressure in her ear at times and abnormal sounds.  No tinnitus.  No ear pain.  Had recent cerumen impaction and ears were irrigated last visit with removal of cerumen.  No acute hearing changes  Lumbar stenosis history.  She had seen neurosurgery and physiatrist for quite some time and had multiple back injections but was not getting much benefit.  She currently takes hydrocodone for pain relief but has very poor control at times.  She has chronic back pain slightly worse right side worse with any movement especially with walking and ambulation.  She is very discouraged regarding her inability to do several things that she used to do.  Very limited physically.  Upcoming dental work.  She inquires about antibiotic prophylaxis.  She has history of both mitral valve replacement 2018 and CABG.  She has multiple drug allergies.  She initially states she had taken amoxicillin before but she had listed intolerance.  Has apparently not had history of clindamycin intolerance.  Past Medical History:  Diagnosis Date   Acute on chronic diastolic heart failure (HCC)    AKI (acute kidney injury) (HCC) 06/22/2022   Aortic stenosis, mild 07/26/2017   Arthritis    Asthma    last attack 02/2015   Atrial fibrillation, chronic (HCC)    Cellulitis of left lower extremity 08/17/2016   Ulcer associated with severe venous insufficiency   Chronic anticoagulation    Chronic diastolic CHF (congestive heart failure) (HCC)    Chronic kidney disease    "RIGHT MANY KIDNEY INFECTIONS AND STONES"   COPD (chronic  obstructive pulmonary disease) (HCC)    Coronary artery disease    Dizziness    H/O: rheumatic fever    Heart murmur    Hypertension    PONV (postoperative nausea and vomiting)    ' SOMETIMES', BUT NOT ALWAYS"   Primary hypertension    RHEUMATIC MITRAL STENOSIS 01/05/2008   Qualifier: Diagnosis of   By: Maple Hudson MD, Clinton D        S/P Maze operation for atrial fibrillation 10/14/2016   Complete bilateral atrial lesion set using cryothermy and bipolar radiofrequency ablation - atrial appendage was not treated due to previous surgical procedure (open mitral commissurotomy)   S/P mitral valve replacement with bioprosthetic valve 10/14/2016   29 mm Medtronic Mosaic porcine bioprosthetic tissue valve   Sciatica 08/29/2023   Tricuspid regurgitation 07/14/2021   UTI (urinary tract infection)    Valvular heart disease    Has mitral stenosis with prior mitral commissurotomy in 1970   Past Surgical History:  Procedure Laterality Date   ABDOMINAL HYSTERECTOMY  1983   endometriosis   APPENDECTOMY     BACK SURGERY     neurosurgery x2   CARDIAC CATHETERIZATION     CARDIAC CATHETERIZATION N/A 08/03/2016   Procedure: Right/Left Heart Cath and Coronary Angiography;  Surgeon: Peter M Swaziland, MD;  Location: [facility] INVASIVE CV LAB;  Service: Cardiovascular;  Laterality: N/A;   cataract surg     CHOLECYSTECTOMY N/A 05/30/2015  Procedure: LAPAROSCOPIC CHOLECYSTECTOMY WITH INTRAOPERATIVE CHOLANGIOGRAM;  Surgeon: Glenna Fellows, MD;  Location: [facility] OR;  Service: General;  Laterality: N/A;   COLONOSCOPY     CORONARY ARTERY BYPASS GRAFT N/A 10/14/2016   Procedure: CORONARY ARTERY BYPASS GRAFTING (CABG);  Surgeon: Purcell Nails, MD;  Location: [facility] OR;  Service: Open Heart Surgery;  Laterality: N/A;   EYE SURGERY     MAZE N/A 10/14/2016   Procedure: MAZE;  Surgeon: Purcell Nails, MD;  Location: [facility] OR;  Service: Open Heart Surgery;  Laterality: N/A;   MITRAL VALVE REPLACEMENT N/A 10/14/2016   Procedure:  REDO MITRAL VALVE REPLACEMENT (MVR);  Surgeon: Purcell Nails, MD;  Location: [facility] OR;  Service: Open Heart Surgery;  Laterality: N/A;   MITRAL VALVE SURGERY Left 1970   Open mitral commissurotomy via left thoracotomy approach   PACEMAKER IMPLANT N/A 09/22/2023   Procedure: PACEMAKER IMPLANT;  Surgeon: [name], MD;  Location: [facility] INVASIVE CV LAB;  Service: Cardiovascular;  Laterality: N/A;   TEE WITHOUT CARDIOVERSION N/A 07/05/2016   Procedure: TRANSESOPHAGEAL ECHOCARDIOGRAM (TEE);  Surgeon: Chilton Si, MD;  Location: [facility] ENDOSCOPY;  Service: Cardiovascular;  Laterality: N/A;   TEE WITHOUT CARDIOVERSION N/A 10/14/2016   Procedure: TRANSESOPHAGEAL ECHOCARDIOGRAM (TEE);  Surgeon: Purcell Nails, MD;  Location: [facility] OR;  Service: Open Heart Surgery;  Laterality: N/A;    reports that she quit smoking about 48 years ago. Her smoking use included cigarettes. She started smoking about 63 years ago. She has a 15 pack-year smoking history. She has never used smokeless tobacco. She reports that she does not drink alcohol and does not use drugs. family history includes Leukemia in her father. Allergies  Allergen Reactions   Aldactone [Spironolactone] Other (See Comments)    dyspnea   Amoxicillin Palpitations    Tachycardia   Amlodipine Other (See Comments)    Low extremity edema   Crestor [Rosuvastatin] Other (See Comments)    myalgia   Diltiazem Other (See Comments)    Causing headaches    Flagyl [Metronidazole Hcl] Other (See Comments)    Causing headaches     Flovent [Fluticasone Propionate] Other (See Comments)    Leg cramps   Gabapentin Swelling   Livalo [Pitavastatin] Other (See Comments)    myalgias   Lyrica [Pregabalin] Swelling   Quinidine Diarrhea and Other (See Comments)    Fever diarrhea   Simvastatin Other (See Comments)    Leg pain, myalgia   Tramadol Nausea Only   Verapamil Other (See Comments)    myalgias   Zetia [Ezetimibe] Other (See Comments)    Myalgia    Benazepril Hcl Cough   Ciprofloxacin Diarrhea   Codeine Nausea Only   Nitrofurantoin Monohyd Macro Nausea Only   Polymyxin B-Trimethoprim Swelling    Review of Systems  Constitutional:  Negative for chills and fever.  Cardiovascular:  Negative for chest pain.  Gastrointestinal:  Negative for abdominal pain.  Genitourinary:  Negative for dysuria.  Musculoskeletal:  Positive for back pain.      Objective:     BP (!) 116/52 (BP Location: Right Arm, Patient Position: Sitting, Cuff Size: Normal)   Pulse 72   Temp 98.4 F (36.9 C) (Oral)   Ht 5\' 3"  (1.6 m)   Wt 120 lb 14.4 oz (54.8 kg)   SpO2 95%   BMI 21.42 kg/m  BP Readings from Last 3 Encounters:  11/22/23 (!) 116/52  11/08/23 120/74  10/19/23 124/66   Wt Readings from Last 3 Encounters:  11/22/23 120  lb 14.4 oz (54.8 kg)  11/08/23 118 lb 1.6 oz (53.6 kg)  10/19/23 118 lb 14.4 oz (53.9 kg)      Physical Exam Vitals reviewed.  Constitutional:      General: She is not in acute distress.    Appearance: She is not ill-appearing.  HENT:     Ears:     Comments: Both ear canals are completely clear with no cerumen.  No abnormalities.  Eardrums appear normal.    Nose:     Comments: Right naris reveals slightly crusted area right anterior septal region. Appears to be healing.  Cardiovascular:     Rate and Rhythm: Normal rate and regular rhythm.  Pulmonary:     Effort: Pulmonary effort is normal.     Breath sounds: Normal breath sounds.  Musculoskeletal:     Right lower leg: No edema.     Left lower leg: No edema.  Neurological:     Mental Status: She is alert.      No results found for any visits on 11/22/23.    The ASCVD Risk score (Arnett DK, et al., 2019) failed to calculate for the following reasons:   The 2019 ASCVD risk score is only valid for ages 67 to 75    Assessment & Plan:   #1 chronic back pain with history of lumbar stenosis.  Patient has had limited improvement with hydrocodone 3 times  daily.  We have recommended referral back to back specialist.  She plans to set up follow-up with physiatrist and go from there.  She will let us know if she needs referral  #2 antibiotic prophylaxis.  She has history of prosthetic mitral valve.  Multiple drug allergies including amoxicillin.  Discussed using clindamycin 300 mg 2 capsules 1 hour prior to dental procedure  #3 left ear symptoms.  Totally normal exam.  No signs of persistent cerumen or ear infection.  Reassurance. Evelena Peat, MD

## 2023-11-22 NOTE — Telephone Encounter (Addendum)
 Alert received from [employer]:  Alert remote transmission:  Monitored VT episode 2/25 @ 09:49, duration 6sec, mean HR 149, V>A, duration > 20 beats - route to triage per protocol 1 additional NSVT classified event 2/4 @ 09:52, V>A, also > 20 beats  Outreach made to Pt.  Left message.  Pt has had multiple appointments today per review of chart.  Will attempt to contact Pt tomorrow AM to assess for any symptoms.

## 2023-11-22 NOTE — Progress Notes (Signed)
 [facility]  Chief Complaint  Patient presents with   Follow-up    COPD with asthma    Past Surgical History:  She  has a past surgical history that includes Cardiac catheterization; cataract surg; Abdominal hysterectomy (1983); Eye surgery; Cholecystectomy (N/A, 05/30/2015); TEE without cardioversion (N/A, 07/05/2016); Cardiac catheterization (N/A, 08/03/2016); Mitral valve surgery (Left, 1970); Back surgery; Appendectomy; Colonoscopy; Mitral valve replacement (N/A, 10/14/2016); Coronary artery bypass graft (N/A, 10/14/2016); MAZE (N/A, 10/14/2016); TEE without cardioversion (N/A, 10/14/2016); and PACEMAKER IMPLANT (N/A, 09/22/2023).  Past Medical History:  Diastolic CHF, Rheumatic fever with valvular heart disease s/p MVR, Atrial fibrillation s/p MAZE, s/p PPM 09/22/23, CKD, Nephrolithiasis, CAD, HTN, COVID 19 infection November 2020  Constitutional:  BP 122/78   Pulse 91   Ht 5\' 3"  (1.6 m)   Wt 121 lb 1.6 oz (54.9 kg)   SpO2 95%   BMI 21.45 kg/m   Brief Summary:  Catherine [name] is a 89 y.o. female former smoker with COPD and asthma.      Subjective:   She does not currently have a maintenance inhaler. She rarely uses her albuterol handheld or nebulizer. Denies wheezing, cough. Rarely has shortness of breath since her PM placement in after 09/22/23 for sinus pauses.  Physical Exam:   Physical Exam: General: Well-appearing, no acute distress HENT: [place], AT Eyes: EOMI, no scleral icterus Respiratory: Clear to auscultation bilaterally.  No crackles, wheezing or rales Cardiovascular: RRR, -M/R/G, no JVD Extremities:-Edema,-tenderness Neuro: AAO x4, CNII-XII grossly intact Psych: Normal mood, normal affect      Pulmonary testing:  PFT 10/12/16 >> FEV1 1.34 (71%), FEV1% 70, TLC 5.00 (101%), DLCO 54%  Chest Imaging:  CT angio chest 10/29/21 >> ATX RML, small effusions  Cardiac Tests:  Echo 11/03/22 >> EF 60 to 65%, mild  elevation in PASP, severe LA and mod RA dilation, s/p MVR, mod TR  Social History:  She  reports that she quit smoking about 48 years ago. Her smoking use included cigarettes. She started smoking about 63 years ago. She has a 15 pack-year smoking history. She has never used smokeless tobacco. She reports that she does not drink alcohol and does not use drugs.  Family History:  Her family history includes Leukemia in her father.     Assessment/Plan:   COPD with asthma - well controlled. Symptoms improved after PPM - bevespi caused leg cramps and urine retention - prn albuterol  Allergic rhinitis. - likely cause of her current symptoms - prn flonase, [place]  Chronic atrial fibrillation, Rheumatic valvular heart disease s/p MVR, CAD s/p CABG, s/p PPM 09/20/23 - followed by Dr. Chilton Si with cardiology  Time Spent Involved in Patient Care on Day of Examination:  21 minutes  Follow up:   Patient Instructions  Continue albuterol as needed  Medication List:   Allergies as of 11/22/2023       Reactions   Aldactone [spironolactone] Other (See Comments)   dyspnea   Amoxicillin Palpitations   Tachycardia   Amlodipine Other (See Comments)   Low extremity edema   Crestor [rosuvastatin] Other (See Comments)   myalgia   Diltiazem Other (See Comments)   Causing headaches   Flagyl [metronidazole Hcl] Other (See Comments)   Causing headaches   Flovent [fluticasone Propionate] Other (See Comments)   Leg cramps   Gabapentin Swelling   Livalo [pitavastatin] Other (See Comments)   myalgias   Lyrica [pregabalin] Swelling   Quinidine Diarrhea, Other (See Comments)  Fever diarrhea   Simvastatin Other (See Comments)   Leg pain, myalgia   Tramadol Nausea Only   Verapamil Other (See Comments)   myalgias   Zetia [ezetimibe] Other (See Comments)   Myalgia   Benazepril Hcl Cough   Ciprofloxacin Diarrhea   Codeine Nausea Only   Nitrofurantoin Monohyd Macro Nausea Only    Polymyxin B-trimethoprim Swelling        Medication List        Accurate as of November 22, 2023  2:40 PM. If you have any questions, ask your nurse or doctor.          STOP taking these medications    clotrimazole-betamethasone cream Commonly known as: LOTRISONE Stopped by: Bruce Burchette   docusate sodium 100 MG capsule Commonly known as: COLACE Stopped by: Evelena Peat       TAKE these medications    acetaminophen 500 MG tablet Commonly known as: TYLENOL Take 500 mg by mouth 2 (two) times daily.   albuterol 108 (90 Base) MCG/ACT inhaler Commonly known as: VENTOLIN HFA INHALE 2 PUFFS INTO THE LUNGS EVERY 4 HOURS AS NEEDED FOR AHEEZING OR SHORTNESS OF BREATH   bumetanide 2 MG tablet Commonly known as: BUMEX Take 1 tablet (2 mg total) by mouth daily.   cholecalciferol 25 MCG (1000 UNIT) tablet Commonly known as: VITAMIN D3 Take 1,000 Units by mouth daily.   clindamycin 300 MG capsule Commonly known as: Cleocin Take two capsules one hour prior to dental procedure. Started by: Evelena Peat   diazepam 5 MG tablet Commonly known as: VALIUM Take 1 tablet (5 mg total) by mouth at bedtime.   Eliquis 2.5 MG Tabs tablet Generic drug: apixaban TAKE ONE TABLET TWICE DAILY (START 09/18/23 OR AS DIRECTED)   Entresto 24-26 MG Generic drug: sacubitril-valsartan Take 1 tablet by mouth 2 (two) times daily.   estradiol 0.1 MG/GM vaginal cream Commonly known as: ESTRACE Place 1 Applicatorful vaginally 3 (three) times a week.   fluconazole 150 MG tablet Commonly known as: DIFLUCAN Take one tablet times one dose as needed for yeast vaginitis. Started by: Evelena Peat   fluticasone 50 MCG/ACT nasal spray Commonly known as: FLONASE Place 1 spray into both nostrils daily as needed for allergies or rhinitis.   HAIR/SKIN/NAILS/BIOTIN PO Take 1 tablet by mouth daily.   Multivitamin Adult Extra C Chew Chew 2 tablets by mouth daily.    HYDROcodone-acetaminophen 5-325 MG tablet Commonly known as: NORCO/VICODIN TAKE ONE TABLET EVERY 8 HOURS AS NEEDED FOR PAIN   metolazone 5 MG tablet Commonly known as: ZAROXOLYN Take 5 mg by mouth as directed. TAKE ONE TABLET AS NEEDED ONCE A WEEK 30 MINUTES BEFORE BUMEX DOSE. AS NEEDED ONLY FOR WEIGHT GAIN 2LBS OVERNIGHT OR 5 LBS IN ONE WEEK   mupirocin ointment 2 % Commonly known as: BACTROBAN Apply 1 Application topically daily.   potassium chloride 10 MEQ tablet Commonly known as: KLOR-CON Take 10 mEq by mouth as directed. TAKE 4 TABLETS TWICE A DAY        Signature:  Mechele Collin, MD [facility] Pager - [phone] 11/22/2023, 2:40 PM

## 2023-11-23 NOTE — Telephone Encounter (Signed)
 Attempted to contact Pt.  Phone is busy.  Will try later.

## 2023-11-24 ENCOUNTER — Ambulatory Visit (INDEPENDENT_AMBULATORY_CARE_PROVIDER_SITE_OTHER): Payer: Medicare Other

## 2023-11-24 ENCOUNTER — Telehealth: Payer: Self-pay | Admitting: *Deleted

## 2023-11-24 VITALS — BP 100/65 | HR 67 | Temp 98.2°F | Resp 16 | Ht 63.0 in | Wt 120.8 lb

## 2023-11-24 DIAGNOSIS — E785 Hyperlipidemia, unspecified: Secondary | ICD-10-CM

## 2023-11-24 MED ORDER — INCLISIRAN SODIUM 284 MG/1.5ML ~~LOC~~ SOSY
284.0000 mg | PREFILLED_SYRINGE | Freq: Once | SUBCUTANEOUS | Status: AC
  Administered 2023-11-24: 284 mg via SUBCUTANEOUS
  Filled 2023-11-24: qty 1.5

## 2023-11-24 NOTE — Progress Notes (Signed)
 Diagnosis: Hyperlipidemia  Provider:  Chilton Greathouse MD  Procedure: Injection  Leqvio (inclisiran), Dose: 284 mg, Site: subcutaneous, Number of injections: 1  Injection Site(s): Left lower quad. abdomen  Post Care: Observation period completed  Discharge: Condition: Good, Destination: Home . AVS Provided  Performed by:  Rico Ala, LPN

## 2023-11-24 NOTE — Telephone Encounter (Signed)
 The pt was returning nurses call. I let her know that the nurse will call her back.

## 2023-11-24 NOTE — Telephone Encounter (Signed)
 Patient reports the day before the event she was cleaning blinds and noted intermittent pain under left breast that afternoon and the day of the NSVT. Pt saw her PCP at 11:00 on 11/22/23 for an issue with her nose/ear issue. Patient denies any further pain. Reports compliance with Eliquis 2.5 mg BID and Entresto 24-26 mg BID. Denies any issues with fluid retention.   Advised patient I will forward to Dr. Jimmey Ralph for further review. Pt advised to call if she has new or worsening symptoms. ED precautions given for chest pain.

## 2023-11-24 NOTE — Telephone Encounter (Signed)
 Copied from CRM [id number]. Topic: General - Other >> Nov 24, 2023 11:43 AM [name] wrote: Reason for CRM: Urgently Requesting A Call Back >> Nov 24, 2023 11:46 AM [name] wrote: Patient states she have a dentist appointment coming up very soon and the dentist office is wanting clearance on rather or not it's okay for patient to have an x-ray completed with her having the pace maker. Patient is urgently requesting a call back with an answer, patient states it's okay to leave a detailed messages on her answering machine if by chance she's unable to pick up. Patient call back number is [phone]

## 2023-11-24 NOTE — Patient Instructions (Signed)
 Inclisiran Injection What is this medication? INCLISIRAN (in kli SIR an) treats high cholesterol. It works by decreasing bad cholesterol (such as LDL) in your blood. Changes to diet and exercise are often combined with this medication. This medicine may be used for other purposes; ask your health care provider or pharmacist if you have questions. COMMON BRAND NAME(S): LEQVIO What should I tell my care team before I take this medication? They need to know if you have any of these conditions: An unusual or allergic reaction to inclisiran, other medications, foods, dyes, or preservatives Pregnant or trying to get pregnant Breast-feeding How should I use this medication? This medication is injected under the skin. It is given by your care team in a hospital or clinic setting. Talk to your care team about the use of this medication in children. Special care may be needed. Overdosage: If you think you have taken too much of this medicine contact a poison control center or emergency room at once. NOTE: This medicine is only for you. Do not share this medicine with others. What if I miss a dose? Keep appointments for follow-up doses. It is important not to miss your dose. Call your care team if you are unable to keep an appointment. What may interact with this medication? Interactions are not expected. This list may not describe all possible interactions. Give your health care provider a list of all the medicines, herbs, non-prescription drugs, or dietary supplements you use. Also tell them if you smoke, drink alcohol, or use illegal drugs. Some items may interact with your medicine. What should I watch for while using this medication? Visit your care team for regular checks on your progress. Tell your care team if your symptoms do not start to get better or if they get worse. You may need blood work while you are taking this medication. What side effects may I notice from receiving this  medication? Side effects that you should report to your care team as soon as possible: Allergic reactions--skin rash, itching, hives, swelling of the face, lips, tongue, or throat Side effects that usually do not require medical attention (report these to your care team if they continue or are bothersome): Joint pain Pain, redness, or irritation at injection site This list may not describe all possible side effects. Call your doctor for medical advice about side effects. You may report side effects to FDA at 1-800-FDA-1088. Where should I keep my medication? This medication is given in a hospital or clinic. It will not be stored at home. NOTE: This sheet is a summary. It may not cover all possible information. If you have questions about this medicine, talk to your doctor, pharmacist, or health care provider.  2024 Elsevier/Gold Standard (2023-08-26 00:00:00)

## 2023-11-24 NOTE — Telephone Encounter (Signed)
 Left message requesting call back.  In addition to device alert, Pt wants to know if it's ok to get an xray at her dental office.

## 2023-11-25 ENCOUNTER — Other Ambulatory Visit: Payer: Self-pay | Admitting: Family Medicine

## 2023-11-25 NOTE — Telephone Encounter (Signed)
 I spoke with the patient and she reported it will be a plain X-ray films

## 2023-11-28 MED ORDER — METOPROLOL SUCCINATE ER 50 MG PO TB24: ORAL_TABLET | ORAL | 3 refills | Status: DC

## 2023-11-28 NOTE — Telephone Encounter (Signed)
 Patient informed of the message below.

## 2023-11-28 NOTE — Telephone Encounter (Signed)
 Patient called to START metoprolol XL 50mg  once daily at night.   Script sent to pharmacy. Patient aware and advised to call if she has further questions or concerns.

## 2023-11-28 NOTE — Addendum Note (Signed)
 Addended by: Unk Lightning T on: 11/28/2023 09:34 AM   Modules accepted: Orders

## 2023-12-12 ENCOUNTER — Other Ambulatory Visit: Payer: Self-pay | Admitting: Family Medicine

## 2023-12-23 ENCOUNTER — Ambulatory Visit (INDEPENDENT_AMBULATORY_CARE_PROVIDER_SITE_OTHER): Payer: Medicare Other

## 2023-12-23 ENCOUNTER — Telehealth: Payer: Self-pay

## 2023-12-23 DIAGNOSIS — I495 Sick sinus syndrome: Secondary | ICD-10-CM

## 2023-12-23 LAB — CUP PACEART REMOTE DEVICE CHECK
Battery Remaining Longevity: 116 mo
Battery Voltage: 3.16 V
Brady Statistic AP VP Percent: 97.38 %
Brady Statistic AP VS Percent: 1.05 %
Brady Statistic AS VP Percent: 0.13 %
Brady Statistic AS VS Percent: 1.44 %
Brady Statistic RA Percent Paced: 99.74 %
Brady Statistic RV Percent Paced: 97.51 %
Date Time Interrogation Session: 20250327232337
Implantable Lead Connection Status: 753985
Implantable Lead Connection Status: 753985
Implantable Lead Implant Date: 20241226
Implantable Lead Implant Date: 20241226
Implantable Lead Location: 753859
Implantable Lead Location: 753860
Implantable Lead Model: 3830
Implantable Lead Model: 4574
Implantable Pulse Generator Implant Date: 20241226
Lead Channel Impedance Value: 342 Ohm
Lead Channel Impedance Value: 380 Ohm
Lead Channel Impedance Value: 475 Ohm
Lead Channel Impedance Value: 475 Ohm
Lead Channel Pacing Threshold Amplitude: 0.5 V
Lead Channel Pacing Threshold Amplitude: 0.75 V
Lead Channel Pacing Threshold Pulse Width: 0.4 ms
Lead Channel Pacing Threshold Pulse Width: 0.4 ms
Lead Channel Sensing Intrinsic Amplitude: 0.875 mV
Lead Channel Sensing Intrinsic Amplitude: 0.875 mV
Lead Channel Sensing Intrinsic Amplitude: 6.875 mV
Lead Channel Sensing Intrinsic Amplitude: 6.875 mV
Lead Channel Setting Pacing Amplitude: 2 V
Lead Channel Setting Pacing Amplitude: 2.75 V
Lead Channel Setting Pacing Pulse Width: 0.4 ms
Lead Channel Setting Sensing Sensitivity: 1.2 mV
Zone Setting Status: 755011

## 2023-12-23 NOTE — Telephone Encounter (Signed)
 Scheduled remote reviewed. Normal device function.  Presenting rhythm:  AP/VP 2 new NSVT, V>A, both >20 beats, 2/28 @ 10:05 & 3/3 @ 10:28, HR's 154-156 - route to triage per protocol (Monitored VT episode 2/25 @ 09:49, duration 6sec, mean HR 149, V>A, duration > 20 beats 1 additional NSVT classified event 2/4 @ 09:52, V>A, also > 20 beats)  Previously reviewed and routed, pt was started on Metoprolol per EPIC Follow up as scheduled. LA, CVRS  Next remote 91 days. LA, CVRS  Unknown duration with no visible termination of VT episode #7 on EGMs. Called number listed and LMTCB.

## 2023-12-23 NOTE — Telephone Encounter (Signed)
 Pt asymptomatic other than some occasional palpitations. Has 91 day f/u w/ JP on Monday. Sent to JP for review.

## 2023-12-25 NOTE — Progress Notes (Signed)
 Electrophysiology Office Note:   Date:  12/26/2023  ID:  Catherine [name], DOB Jan 29, 1935, MRN [id number]  Primary Cardiologist: Chilton Si, MD Electrophysiologist: [name], MD      History of Present Illness:   Catherine [name] is a 89 y.o. female with h/o mitral valve stenosis s/p replacement, CAD s/p CABG, parox Afib s/p MAZE, COPD, asthma, CKD, HTN, HLD, diastolic heart failure, recent covid infx with spontaneous PTX and sinus node dysfunction s/p dual chamber pacemaker who is being seen today for follow up evaluation after pacemaker implant.   Discussed the use of AI scribe software for clinical note transcription with the patient, who gave verbal consent to proceed.  History of Present Illness Patient's main complaint at the moment is significant pain in her leg and back, described as nerve pain, which she associates with her sciatic nerve. She has previously received injections for this pain, which were initially helpful but have not been repeated in recent years. Hydrocodone provides only temporary relief, lasting about two hours. She experiences episodes where the bottom chamber of her heart beats rapidly for short durations, occurring about four times in the past month. These episodes are sometimes accompanied by brief pain and a sensation of shortness of breath. She was started on metoprolol for this. She reports occasional visual disturbances, feeling like she 'can't even see out of one of my eyes for a little while,' although her recent eye exam was normal. She is curious as to whether this is secondary to metoprolol.  Review of systems complete and found to be negative unless listed in HPI.   EP Information / Studies Reviewed:    EKG is ordered today. Personal review as below.  EKG Interpretation Date/Time:  Monday December 26 2023 09:13:24 EDT Ventricular Rate:  82 PR Interval:  214 QRS Duration:  94 QT Interval:  388 QTC Calculation: 453 R Axis:   60  Text  Interpretation: AV dual-paced rhythm with prolonged AV conduction When compared with ECG of 07-Oct-2023 10:49, Vent. rate has decreased BY   5 BPM Confirmed by [name] [phone]) on 12/26/2023 7:34:24 PM   Echo 11/01/23:   1. Left ventricular ejection fraction, by estimation, is 65 to 70%. The  left ventricle has normal function. The left ventricle has no regional  wall motion abnormalities. There is moderate left ventricular hypertrophy.  Left ventricular diastolic  parameters are indeterminate.   2. Right ventricular systolic function is mildly reduced. The right  ventricular size is mildly enlarged. There is mildly elevated pulmonary  artery systolic pressure. The estimated right ventricular systolic  pressure is 37.8 mmHg.   3. Left atrial size was severely dilated.   4. Right atrial size was moderately dilated.   5. The mitral valve has been repaired/replaced. Trivial mitral valve  regurgitation. The mean mitral valve gradient is 3.6 mmHg. There is a 29  mm Medtronic Mosaic Porcine bioprosthetic valve present in the mitral  position. Procedure Date: 10/14/2016. Echo   findings are consistent with normal structure and function of the mitral  valve prosthesis.   6. Tricuspid valve regurgitation is mild to moderate.   7. The aortic valve is abnormal. There is moderate calcification of the  aortic valve. Aortic valve regurgitation is not visualized. Mild-Moderate  aortic valve stenosis. Aortic valve area, by VTI measures 0.85 cm. Aortic  valve mean gradient measures  15.3 mmHg. Aortic valve Vmax measures 2.74 m/s. Aortic valve acceleration  time measures 90 msec. DVI 0.48.   8. The  inferior vena cava is dilated in size with >50% respiratory  variability, suggesting right atrial pressure of 8 mmHg.   Pacer Check 12/22/23:  2 new NSVT, V>A, both >20 beats, 2/28 @ 10:05 & 3/3 @ 10:28, HR's 154-156 - route to triage per protocol  (Monitored VT episode 2/25 @ 09:49, duration 6sec, mean HR  149, V>A, duration > 20 beats  1 additional NSVT classified event 2/4 @ 09:52, V>A, also > 20 beats)    Risk Assessment/Calculations:    CHA2DS2-VASc Score = 6   This indicates a 9.7% annual risk of stroke. The patient's score is based upon: CHF History: 1 HTN History: 1 Diabetes History: 0 Stroke History: 0 Vascular Disease History: 1 Age Score: 2 Gender Score: 1             Physical Exam:   VS:  BP 110/62   Pulse 82   Ht 5\' 3"  (1.6 m)   Wt 121 lb 12.8 oz (55.2 kg)   SpO2 97%   BMI 21.58 kg/m    Wt Readings from Last 3 Encounters:  12/26/23 121 lb 12.8 oz (55.2 kg)  11/24/23 120 lb 12.8 oz (54.8 kg)  11/22/23 121 lb 1.6 oz (54.9 kg)     GEN: Well nourished, well developed in no acute distress NECK: No JVD CARDIAC: Normal rate, regular rhythm. Well healed left chest pacer pocket. RESPIRATORY:  Clear to auscultation without rales, wheezing or rhonchi  ABDOMEN: Soft, non-distended EXTREMITIES:  Trace  edema; No deformity   ASSESSMENT AND PLAN:    #. S/p dual chamber pacemaker: Diffuse atrial scarring, requiring implant of passive RA lead in order to achieve any atrial sensed events.  #. Sinus node dysfunction and sinus pauses:  -In clinic device interrogation performed today.  Appropriate device function and stable lead parameters.  Estimated longevity 9.6 years.   -Presenting rhythm was AP/VP. There was no underlying atrial or ventricular activity on testing. -Continue with regular scheduled remote monitoring.  #. NSVT: Device detected.  - Increase metoprolol XL to 100mg  once daily at night.  #. Paroxysmal atrial fibrillation s/p MAZE: AT/AF burden less than 0.1% on device check. #. Secondary hypercoagulable state due to atrial fibrillation:  - Continue Eliquis 2.5 mg twice daily. -Toprol as above.  #. Hypertension - Goal today.  Recommend checking blood pressures 1-2 times per week at home and recording the values.  Recommend bringing these recordings to the  primary care physician.  #. Chronic diastolic heart failure: Well compensated on exam. #. S/p mitral valve replacement: Well compensated on exam #. CAD status post CABG: No angina. -Continue current medication regimen and follow-up with primary cardiologist.  Follow up with Dr. Jimmey Ralph in 12 months  Signed, [name], MD

## 2023-12-26 ENCOUNTER — Encounter: Payer: Self-pay | Admitting: Cardiology

## 2023-12-26 ENCOUNTER — Ambulatory Visit: Payer: Medicare Other | Attending: Cardiology | Admitting: Cardiology

## 2023-12-26 VITALS — BP 110/62 | HR 82 | Ht 63.0 in | Wt 121.8 lb

## 2023-12-26 DIAGNOSIS — I10 Essential (primary) hypertension: Secondary | ICD-10-CM

## 2023-12-26 DIAGNOSIS — D6869 Other thrombophilia: Secondary | ICD-10-CM

## 2023-12-26 DIAGNOSIS — Z950 Presence of cardiac pacemaker: Secondary | ICD-10-CM | POA: Diagnosis not present

## 2023-12-26 DIAGNOSIS — I480 Paroxysmal atrial fibrillation: Secondary | ICD-10-CM

## 2023-12-26 DIAGNOSIS — I495 Sick sinus syndrome: Secondary | ICD-10-CM | POA: Diagnosis not present

## 2023-12-26 MED ORDER — METOPROLOL SUCCINATE ER 100 MG PO TB24: 100.0000 mg | ORAL_TABLET | Freq: Every evening | ORAL | 3 refills | Status: DC

## 2023-12-26 NOTE — Patient Instructions (Signed)
 Medication Instructions:  Your physician has recommended you make the following change in your medication:  1) INCREASE Toprol XL (metoprolol succinate) to 100 mg every night *If you need a refill on your cardiac medications before your next appointment, please call your pharmacy*  Follow-Up: At [facility], you and your health needs are our priority.  As part of our continuing mission to provide you with exceptional heart care, our providers are all part of one team.  This team includes your primary Cardiologist (physician) and Advanced Practice Providers or APPs (Physician Assistants and Nurse Practitioners) who all work together to provide you with the care you need, when you need it.  Your next appointment:   1 year  Provider:   You may see [name], MD or one of the following Advanced Practice Providers on your designated Care Team:   Francis Dowse, [place] Casimiro Needle "Mardelle Matte" [place], [place] Sherie Don, NP Canary Brim, NP         1st Floor: - Lobby - Registration  - Pharmacy  - Lab - Cafe  2nd Floor: - PV Lab - Diagnostic Testing (echo, CT, nuclear med)  3rd Floor: - Vacant  4th Floor: - TCTS (cardiothoracic surgery) - AFib Clinic - Structural Heart Clinic - Vascular Surgery  - Vascular Ultrasound  5th Floor: - HeartCare Cardiology (general and EP) - Clinical Pharmacy for coumadin, hypertension, lipid, weight-loss medications, and med management appointments    Valet parking services will be available as well.

## 2023-12-28 ENCOUNTER — Encounter: Payer: Medicare Other | Admitting: Internal Medicine

## 2024-01-02 ENCOUNTER — Telehealth: Payer: Self-pay | Admitting: Cardiology

## 2024-01-02 DIAGNOSIS — I455 Other specified heart block: Secondary | ICD-10-CM

## 2024-01-02 NOTE — Telephone Encounter (Signed)
 Pt c/o medication issue:  1. Name of Medication: metoprolol succinate (TOPROL-XL) 100 MG 24 hr tablet   2. How are you currently taking this medication (dosage and times per day)? As written  3. Are you having a reaction (difficulty breathing--STAT)?   4. What is your medication issue? Pt doesn't feel like she need this high of a dosage. Please advise

## 2024-01-02 NOTE — Telephone Encounter (Signed)
 Patient calling in concerned about increased dose of Toprol XL.  Per Dr. Jimmey Ralph at last OV on 12/26/23: #. NSVT: Device detected.  - Increase metoprolol XL to 100mg  once daily at night.  Patient states she took 100 mg of Toprol XL 3/31 through 4/3 and noted she had difficulty breathing and had to use her albuterol inhaler as well as albuterol nebulizer. She reports she went back to taking 50 mg of Toprol XL this past Friday, Saturday and Sunday and has felt better on this dose.  Patient does not feel that she can tolerate the increased dose and would like to remain on 50 mg if OK with Dr. Jimmey Ralph. If she is to remain on Toprol XL 50 mg daily, will need new Rx sent to pharmacy.  Will forward to Dr. Jimmey Ralph and his nurse to address and follow-up with patient when they return to the office tomorrow.

## 2024-01-03 ENCOUNTER — Encounter (HOSPITAL_BASED_OUTPATIENT_CLINIC_OR_DEPARTMENT_OTHER): Payer: Self-pay | Admitting: Family

## 2024-01-03 ENCOUNTER — Ambulatory Visit (HOSPITAL_BASED_OUTPATIENT_CLINIC_OR_DEPARTMENT_OTHER): Payer: Medicare Other | Admitting: Family

## 2024-01-03 VITALS — BP 126/68 | HR 80 | Ht 63.0 in | Wt 119.0 lb

## 2024-01-03 DIAGNOSIS — I25118 Atherosclerotic heart disease of native coronary artery with other forms of angina pectoris: Secondary | ICD-10-CM | POA: Diagnosis not present

## 2024-01-03 DIAGNOSIS — E785 Hyperlipidemia, unspecified: Secondary | ICD-10-CM | POA: Diagnosis not present

## 2024-01-03 DIAGNOSIS — I5032 Chronic diastolic (congestive) heart failure: Secondary | ICD-10-CM | POA: Diagnosis not present

## 2024-01-03 DIAGNOSIS — I4819 Other persistent atrial fibrillation: Secondary | ICD-10-CM | POA: Diagnosis not present

## 2024-01-03 DIAGNOSIS — I10 Essential (primary) hypertension: Secondary | ICD-10-CM

## 2024-01-03 DIAGNOSIS — D6859 Other primary thrombophilia: Secondary | ICD-10-CM | POA: Diagnosis not present

## 2024-01-03 DIAGNOSIS — E876 Hypokalemia: Secondary | ICD-10-CM

## 2024-01-03 MED ORDER — METOLAZONE 5 MG PO TABS: 5.0000 mg | ORAL_TABLET | ORAL | 1 refills | Status: DC

## 2024-01-03 MED ORDER — METOPROLOL SUCCINATE ER 50 MG PO TB24: 50.0000 mg | ORAL_TABLET | Freq: Every evening | ORAL | 3 refills | Status: DC

## 2024-01-03 NOTE — Patient Instructions (Signed)
 Medication Instructions:  Your physician has recommended you make the following change in your medication:   Change Metoprolol 50mg  daily at bedtime   *If you need a refill on your cardiac medications before your next appointment, please call your pharmacy*  Lab Work: Your physician recommends that you return for lab work today- BMP, CBC & Mag    Follow-Up: At [facility], you and your health needs are our priority.  As part of our continuing mission to provide you with exceptional heart care, our providers are all part of one team.  This team includes your primary Cardiologist (physician) and Advanced Practice Providers or APPs (Physician Assistants and Nurse Practitioners) who all work together to provide you with the care you need, when you need it.  Your next appointment:   August with Dr. Duke Salvia

## 2024-01-03 NOTE — Telephone Encounter (Signed)
 [name], MD  [facility] Triage; Chauvigne, Carlyle, RNJust now (7:57 AM)   JP She can remain on 50mg     Pt is seeing Gillian Shields NP today. Will route to her.

## 2024-01-03 NOTE — Progress Notes (Addendum)
 Cardiology Office Note:  .   Date:  01/03/2024  ID:  Catherine [name], DOB May 01, 1935, MRN [id number] PCP: Kristian Covey, MD  [facility] Providers Cardiologist:  Chilton Si, MD Electrophysiologist:  [name], MD    History of Present Illness: .   Catherine [name] is a 89 y.o. 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    History of rheumatic MV disease and underwent mitral commissurotomy in the 1970s. 09/2017 she underwent bioprosthetic MVR, CABGx1, and bilateral MAZE procedure with Dr. Cornelius Moras.    She was seen 11/2020 by Dr. Duke Salvia with improvement in abdominal bloating after transition from Lasix to Torsemide. Her persistent LE edema was thought to be due to venous insufficiency. Unable to tolerate compression socks and declined referral to [facility].   Hospitalized 10/2021 with COPD exacerbation and HFpEF.  Echo 10/2021 LVEF 60 to 65%, RV mildly reduced, RVSP 58 mmHg, moderate BAE, normal-appearing bioprosthetic MVR, moderate TR.  She was admitted 01/05/22 for acute on chronic diastolic heart failure with acute hypoxic respiratory failure and COPD exacerbation.   She developed bradycardia, Metoprolol reduced. She was trialed on amiodarone but did not tolerate.   At clinic 03/04/22 her Losartan and Torsemide were transitioned to [facility] and Bumex. 03/2022 Metolazone added once per week for volume control.     Echo 11/03/22 normal LVEF 60 to 65%, RV moderately reduced function, mild elevated PASP, MV prosthesis functioning appropriately, mild aortic stenosis with mean gradient 11 mmHg.  Despite further reduction and discontinuation of Metoprolol bradycardia persisted. Admitted 12/25-12/27/24 with placement of dual chamber PPM. Her atrial lead is passive per  EP team. She saw Dr. Jimmey Ralph 12/26/23 most obthered by leg and back pain. In clinic device interrogation appropriate. [name], Toprol increased to 100mg  at bedtime however she felt more dysnpea and requested to return to 50mg  dose.   Presents today for follow up. She has had a difficult time adjusting to PPM, but is hopeful that things are looking more positive. She has recently fallen on the treadmill and her husband had it removed. She is going to continue to try to walk outside some as the weather gets warmer. Dyspnea resolved after returning to 50 mg of metoprolol at bedtime. She reports BP at home around 110-125/50-60's and heart rate in the 60's and 70's. She has noticed some weight loss and has been increasing her protein with a Boost in the morning and trying to eat as she is hungry. Her weight has stayed stable with some sporadic weight gain. She reports taking an extra Bumex Monday, Wednesday, and Friday. Reports no chest pain, pressure, or tightness. No  orthopnea, PND. Mild bilateral ankle edema that is overall unchanged and not bothersome. Reports no palpitations.    Labs via [facility]: 09/30/23 creatinine 0.75, GFR 77, K 3.4 09/23/23 Hb 11  ROS: Please see the history of present illness.    All other systems reviewed and are negative.   Studies Reviewed: .           Risk Assessment/Calculations:    CHA2DS2-VASc Score = 6   This indicates a 9.7% annual risk of stroke. The patient's score is based upon: CHF History: 1 HTN History: 1 Diabetes History: 0 Stroke History: 0 Vascular Disease History: 1 Age Score: 2 Gender  Score: 1            Physical Exam:   VS:  BP 126/68   Pulse 80   Ht 5\' 3"  (1.6 m)   Wt 119 lb (54 kg)   SpO2 96%   BMI 21.08 kg/m    Wt Readings from Last 3 Encounters:  01/03/24 119 lb (54 kg)  12/26/23 121 lb 12.8 oz (55.2 kg)  11/24/23 120 lb 12.8 oz (54.8 kg)    GEN: Well nourished, well developed in no acute distress NECK: No JVD; No carotid  bruits CARDIAC: IRR, gr 2/6 systolic murmur, no rubs, gallops RESPIRATORY:  Clear to auscultation without rales, wheezing or rhonchi  ABDOMEN: Soft, non-tender, non-distended EXTREMITIES:  Mild non pitting edema BLE; No deformity   ASSESSMENT AND PLAN: .    Sinus node dysfunction s/p PPM - Per EP. She did report no SOB with the decreased dose of metoprolol 50 mg QHS. Decrease metoprolol succinate to 50 mg at bedtime per Dr. Jimmey Ralph. Refill provided.  HFpEF - 09/30/23 creatinine 0.75, GFR 77, K 3.4. Mild non pitting edema on bilateral ankles. She reports taking her Bumex 2 mg daily, and extra pill Monday, Wednesday, and Friday.She reports not needing the PRN metolazone 5 mg weekly. Educated on when to take metolazone. She is working to increase her activity by walking outside as the weather gets warmer. Recheck BMET and Mag today. Continue GDMT: Bumex 2 mg daily, metolazone 5 mg PRN, potassium 40 mEq BID,metoprolol 50 mg QHS, and Entresto 24-26 mg daily. Readdress Bumex dosing based on labs as taking extra tablet M/W/F and with stable weight may not require.   CAD / HLD - Stable with no anginal symptoms. No indication for ischemic evaluation. She has noted some fatigue and SOB with metoprolol dose increase, and has since resolved since reducing dose to 50 mg. GDMT: no aspirin d/t OAC, and metoprolol 50 mg at bedtime. Increase activity as tolerable.   HTN -  BP well controlled. Continue current antihypertensive regimen of Entresto 24-26 mg twice daily. Discussed to monitor BP at home at least 2 hours after medications and sitting for 5-10 minutes.  S/p MVR / mild AS - Echo 11/01/2023 mild-moderate aortic valve stenosis with VTI measuring 0.85 cm and mean gradient of 15.3 mmHg. No symptoms indicate need for repeat echo at this time. Continue optimal BP control.  Persistent atrial fib / Hypercoagulable state - She has reported no signs of bleeding. 09/23/23 Hgb: 11. Recheck CBC today. Continue Eliquis 2.5  mg BID and metoprolol 50 mg at bedtime. Reduced Eliquis dose due to age, body habitus. CHA2DS2-VASc Score = 6 [CHF History: 1, HTN History: 1, Diabetes History: 0, Stroke History: 0, Vascular Disease History: 1, Age Score: 2, Gender Score: 1].  Therefore, the patient's annual risk of stroke is 9.7 %.     ?Pilonidal cyst - She has what appears to be a pilonidal cyst above her buttocks. It is firm on palpation and mildly tender. No evidence of infection. Encouraged her to discuss with PCP.        Dispo: follow up in August   Signed, Alver Sorrow, NP

## 2024-01-03 NOTE — Telephone Encounter (Signed)
 Patient updated via clinic visit - see clinic note for details.   Alver Sorrow, NP

## 2024-01-04 ENCOUNTER — Other Ambulatory Visit: Payer: Self-pay | Admitting: Family Medicine

## 2024-01-04 LAB — CBC
Hematocrit: 41.7 % (ref 34.0–46.6)
Hemoglobin: 13.3 g/dL (ref 11.1–15.9)
MCH: 29.8 pg (ref 26.6–33.0)
MCHC: 31.9 g/dL (ref 31.5–35.7)
MCV: 94 fL (ref 79–97)
Platelets: 173 10*3/uL (ref 150–450)
RBC: 4.46 x10E6/uL (ref 3.77–5.28)
RDW: 12.4 % (ref 11.7–15.4)
WBC: 8.2 10*3/uL (ref 3.4–10.8)

## 2024-01-04 LAB — BASIC METABOLIC PANEL WITH GFR
BUN/Creatinine Ratio: 23 (ref 12–28)
BUN: 18 mg/dL (ref 8–27)
CO2: 26 mmol/L (ref 20–29)
Calcium: 9.2 mg/dL (ref 8.7–10.3)
Chloride: 102 mmol/L (ref 96–106)
Creatinine, Ser: 0.77 mg/dL (ref 0.57–1.00)
Glucose: 92 mg/dL (ref 70–99)
Potassium: 4.2 mmol/L (ref 3.5–5.2)
Sodium: 143 mmol/L (ref 134–144)
eGFR: 75 mL/min/{1.73_m2} (ref >59)

## 2024-01-04 LAB — MAGNESIUM: Magnesium: 2.1 mg/dL (ref 1.6–2.3)

## 2024-01-05 ENCOUNTER — Telehealth: Payer: Self-pay | Admitting: Cardiology

## 2024-01-05 NOTE — Telephone Encounter (Signed)
 Spoke with patient who stated when she went back to the Metoprolol 50 mg she it started making her feel hyper  Stated she has not slept since her appointment, even tried taking at 6 pm still kept her up   Explained to her that this is the same medication/dose she was taking prior to increasing to 100 mg  She does not want to continue  Discussed with Ronn Melena NP and ok to have her start taking in the morning

## 2024-01-05 NOTE — Telephone Encounter (Signed)
 Advised patient, verbalized understanding

## 2024-01-05 NOTE — Telephone Encounter (Signed)
 Pt c/o medication issue:  1. Name of Medication: metoprolol succinate (TOPROL-XL) 50 MG 24 hr tablet   2. How are you currently taking this medication (dosage and times per day)?  Take 1 tablet (50 mg total) by mouth at bedtime. Take with or immediately following a meal.   3. Are you having a reaction (difficulty breathing--STAT)? No   4. What is your medication issue? Patient says that medication makes her stay up all night long

## 2024-01-06 ENCOUNTER — Telehealth (HOSPITAL_BASED_OUTPATIENT_CLINIC_OR_DEPARTMENT_OTHER): Payer: Self-pay | Admitting: *Deleted

## 2024-01-06 NOTE — Telephone Encounter (Signed)
 Spoke with patient regarding Metoprolol the was prescribed by Dr Jimmey Ralph  She has noticed that her hair has started falling out since starting and concerned coming from new medication   Will forward to Pharm D for review

## 2024-01-06 NOTE — Telephone Encounter (Signed)
 Catherine [name], [facility] to Me     01/06/24  4:37 PM Hair loss was not a commonly-reported side effect during clinical trials of metoprolol. However, rare reports of reversible hair loss have been reported by patients taking it. After stopping the medication, hair grows back again.  Typically, hair loss doesn't occur until a few months after starting metoprolol. This is because it can take 2-5 months for hair to fall out after a trigger, like metoprolol, is present. At this time, you may notice clumps of hair coming out during brushing or showering If  she just started taking metoprolol it is unlikely it's from metoprolol  Advised patient, verbalized understanding.

## 2024-01-10 NOTE — Telephone Encounter (Signed)
 Called and left voice message to call back

## 2024-01-10 NOTE — Telephone Encounter (Signed)
 Called and spoke to patient. Patient verified with 2 identifiers (DOB and Last Name)  Patient's concerns:  - pt is concerned that she may be allergic to metoprolol; has been having headaches and diarrhea Saturday and today.  - pt is concerned of hair loss being due to metoprolol.  - pt is concerned if pacemaker is necessary if HR was too low and now she is on metoprolol to control HR.  Nurse's Recommendations:  - reminded pt of phone call with Eldonna Greenspan, LPN on 1/61 of how hair loss is not a common side effect of metoprolol. (Please see phone encounter)  - will route concerns to Dr. Daneil Dunker.  - advised pt to continue taking lopressor.  Patient verbalized understanding.

## 2024-01-10 NOTE — Telephone Encounter (Signed)
 Pt is requesting a callback regarding her being advised to start taking the Metoprolol 50mg  in the mornings instead after nurse Eldonna Greenspan spoke with Creed Dodrill., NP. Now she stated she can sleep a little better but her hair is still falling out so she'd like to discuss further with a nurse again about this. Please advise

## 2024-01-11 MED ORDER — METOPROLOL TARTRATE 50 MG PO TABS: 50.0000 mg | ORAL_TABLET | Freq: Two times a day (BID) | ORAL | 3 refills | Status: DC

## 2024-01-11 NOTE — Telephone Encounter (Signed)
 Pt returning call

## 2024-01-11 NOTE — Addendum Note (Signed)
 Addended by: Zorita Hiss on: 01/11/2024 03:41 PM   Modules accepted: Orders

## 2024-01-11 NOTE — Telephone Encounter (Signed)
 Called and spoke to pt. Discussed MD's recommendation. Pt is in agreement. Old lopressor rx has been cancelled. New rx sent to pharmacy. Pt verbalized understanding.

## 2024-01-20 ENCOUNTER — Telehealth: Payer: Self-pay | Admitting: Cardiology

## 2024-01-20 NOTE — Telephone Encounter (Signed)
 She reports insomnia.  She was still awake at 3:00 am.  She has not slept every since been on the medicine.  Yesterday she only took the morning tablet.  Spells of blurry vision is also new since starting medication.  Not occurring every day.    Asked about SOB, she has an inhaler and a nebulizer but until she had the pacemaker she has not needed the albuterol  in a long long time.  But going on the metoprolol  she'd needed the albuterol  for SOB and it helps.    Also reporting continued hair loss.  She wants to know isn't there something else that can be tried?  BP on metoprolol  is too low and she doesn't feel good 96/51, 68, 112/55, 100/49, 97/44.  She feels she is staying hydrated.     She states "I'm just not going to take anymore of this" .   it was best when it was 50 mg of Toprol  that she took once a day in the morning that she slept best.  When she went to 100 mg daily is when insomnia started.  She also takes a valium  every night as well.    I asked her to at least take 1/2 tablet twice a day - she doesn't want to try at night because then she won't sleep.  Pt aware I will let Dr. Daneil Dunker and Dr. Theodis Fiscal know and will call her with any recommendations.

## 2024-01-20 NOTE — Telephone Encounter (Signed)
 Pt c/o medication issue:  1. Name of Medication:   metoprolol  tartrate (LOPRESSOR ) 50 MG tablet    2. How are you currently taking this medication (dosage and times per day)? As written  3. Are you having a reaction (difficulty breathing--STAT)? No   4. What is your medication issue? Insomnia,headaches,  hair loss, sob, blurred vision

## 2024-01-23 NOTE — Telephone Encounter (Signed)
 Patient reports that over the weekend she reduced her metoprolol  tartrate to 1/2 tablet (25 mg) in the morning and evening and states that she did well on that. She has been able to sleep. She would like to continue on this for now unless she starts having issues.

## 2024-01-27 DIAGNOSIS — M5416 Radiculopathy, lumbar region: Secondary | ICD-10-CM | POA: Diagnosis not present

## 2024-01-27 DIAGNOSIS — M47816 Spondylosis without myelopathy or radiculopathy, lumbar region: Secondary | ICD-10-CM | POA: Diagnosis not present

## 2024-01-31 NOTE — Progress Notes (Signed)
 Remote pacemaker transmission.

## 2024-02-06 ENCOUNTER — Telehealth: Payer: Self-pay

## 2024-02-06 NOTE — Telephone Encounter (Signed)
Please advise holding Eliquis prior to [facility].  Thank you!  DW

## 2024-02-06 NOTE — Telephone Encounter (Signed)
   Pre-operative Risk Assessment    Patient Name: Catherine [name]  DOB: 1935-04-05 MRN: [id number]   Date of last office visit: 01/03/24 CAITLIN WALKER, NP Date of next office visit: 05/22/24 [facility] RANDOPLH, MD   Request for Surgical Clearance    Procedure:  ESI  Date of Surgery:  Clearance TBD                                Surgeon:  DR Katheryn Pandy Surgeon's Group or Practice Name:  [facility] Phone number:  [phone] Fax number:  [phone]   Type of Clearance Requested:   - Medical  - Pharmacy:  Hold Apixaban  (Eliquis ) 3 DAYS PRIOR AND RESUME NEXT DAY   Type of Anesthesia:  Not Indicated   Additional requests/questions:    Signed, Collin Deal   02/06/2024, 8:17 AM

## 2024-02-06 NOTE — Telephone Encounter (Signed)
 Catherine [name],   You saw this patient on 01/03/2024. Per office protocol, will you please comment on medical clearance for ESI?  Please route your response to P CV DIV Preop. I will communicate with requesting office once you have given recommendations.   Thank you!  Morey Ar, NP

## 2024-02-06 NOTE — Telephone Encounter (Signed)
 Based on last office visit, Per AHA/ACC guidelines, she is deemed acceptable risk for the planned procedure without additional cardiovascular testing.   Kenna Kirn S Marcus Schwandt, NP

## 2024-02-07 NOTE — Telephone Encounter (Signed)
 Patient with diagnosis of afib on Eliquis  for anticoagulation.    Procedure: ESI Date of procedure: TBD   CHA2DS2-VASc Score = 6   This indicates a 9.7% annual risk of stroke. The patient's score is based upon: CHF History: 1 HTN History: 1 Diabetes History: 0 Stroke History: 0 Vascular Disease History: 1 Age Score: 2 Gender Score: 1      CrCl 44.7 ml/min Platelet count 173  Patient has not had an Afib/aflutter ablation within the last 3 months or DCCV within the last 30 days  Per office protocol, patient can hold Eliquis  for 3 days prior to procedure.    **This guidance is not considered finalized until pre-operative APP has relayed final recommendations.**

## 2024-02-07 NOTE — Telephone Encounter (Signed)
   Name: Catherine [name]  DOB: 30-Mar-1935  MRN: [id number]   Primary Cardiologist: Maudine Sos, MD  Chart reviewed as part of pre-operative protocol coverage. [name] was last seen on 01/03/2024 by Neomi Banks, NP.  Per Jerrold Morgan "Based on last office visit, Per AHA/ACC guidelines, she is deemed acceptable risk for the planned procedure without additional cardiovascular testing."  Per Pharm D, patient may hold Eliquis  for 3 days prior to procedure.    I will route this recommendation to the requesting party via Epic fax function and remove from pre-op  pool. Please call with questions.  Morey Ar, NP 02/07/2024, 2:11 PM

## 2024-02-10 ENCOUNTER — Telehealth (HOSPITAL_BASED_OUTPATIENT_CLINIC_OR_DEPARTMENT_OTHER): Payer: Self-pay | Admitting: Cardiovascular Disease

## 2024-02-10 NOTE — Telephone Encounter (Signed)
 Advised patient, verbalized understanding

## 2024-02-10 NOTE — Telephone Encounter (Signed)
 Spoke with patient regarding breathing and weight gain  Stated over the last week her breathing has gotten worse Has had to use her Albuterol  inhaler and breathing treatments, does help some  Hasn't had to use inhaler in months Weight is up to 120 today, 2 pounds from yesterday. This is the first time her weight has been in the 120's since getting pacemaker Swelling in feet, taking diuretics as prescribed and will take Metolazone  today  Feels bloated  Blood pressure today 106/51 HR 66, previous readings 97/53 HR 83, 114/53 HR 78, 108/52 HR 71, 101/50 HR 62 and 112/50 HR 69 Feels like her blood pressure is too low  Yesterday took only Metoprolol  25 in am, none last night  Discussed with patient that she was taking the Metoprolol  succ back in January 2024, she didn't realize   Will forward to Creed Dodrill NP for review

## 2024-02-10 NOTE — Telephone Encounter (Signed)
 Continue Metoprolol  Tartrate half tablet (25mg ) twice per day.  Recommend Metolazone  today if she has not yet taken her Bumex  and then take Bumex  30 minutes later.  If she already took Bumex  today, wait til tomorrow to take Metolazone .   Call to update us  Monday on how she is feeling  Clearnce Curia, NP

## 2024-02-10 NOTE — Telephone Encounter (Signed)
 Pt c/o medication issue:  1. Name of Medication:   metoprolol  tartrate (LOPRESSOR ) 50 MG tablet   2. How are you currently taking this medication (dosage and times per day)?   As prescribed  3. Are you having a reaction (difficulty breathing--STAT)?   4. What is your medication issue?   Patient stated her HR and breathing is getting worse and wants a call back from [employer] regarding next steps.

## 2024-02-13 ENCOUNTER — Other Ambulatory Visit: Payer: Self-pay | Admitting: Family Medicine

## 2024-02-13 ENCOUNTER — Telehealth: Payer: Self-pay | Admitting: Cardiovascular Disease

## 2024-02-13 NOTE — Telephone Encounter (Signed)
 PT update as requested from Friday- please review and advise

## 2024-02-13 NOTE — Telephone Encounter (Signed)
 Weight trending down appropriately which should help edema. Recommend elevating legs, compression stockings, low sodium diet.   If still bothered by edema may increase Bumex  to 2nd BID x 2 days then return to daily dosing.   Neomi Banks, NP

## 2024-02-13 NOTE — Telephone Encounter (Signed)
 Pt c/o swelling: STAT is pt has developed SOB within 24 hours  How much weight have you gained and in what time span? 117 this morning, 118 yesterday and 119 Saturday  If swelling, where is the swelling located? Legs, feet and ankles. Pt's right leg is very swelled more than the left leg.   Are you currently taking a fluid pill? Yes   Are you currently SOB? Yes  Do you have a log of your daily weights (if so, list)? No  Have you gained 3 pounds in a day or 5 pounds in a week? yes  Have you traveled recently? No  Pt c/o Shortness Of Breath: STAT if SOB developed within the last 24 hours or pt is noticeably SOB on the phone  1. Are you currently SOB (can you hear that pt is SOB on the phone)? Yes, but unable to hear it over the phone  2. How long have you been experiencing SOB? Off and on since March   3. Are you SOB when sitting or when up moving around? Moving around  4. Are you currently experiencing any other symptoms? Some dizziness and lightheadedness.

## 2024-02-13 NOTE — Telephone Encounter (Signed)
 Returned call to patient and reviewed recommendations!

## 2024-02-15 ENCOUNTER — Other Ambulatory Visit (HOSPITAL_BASED_OUTPATIENT_CLINIC_OR_DEPARTMENT_OTHER): Payer: Self-pay | Admitting: Pulmonary Disease

## 2024-02-15 ENCOUNTER — Other Ambulatory Visit (HOSPITAL_BASED_OUTPATIENT_CLINIC_OR_DEPARTMENT_OTHER): Payer: Self-pay

## 2024-02-15 MED ORDER — ALBUTEROL SULFATE (2.5 MG/3ML) 0.083% IN NEBU: 2.5000 mg | INHALATION_SOLUTION | Freq: Four times a day (QID) | RESPIRATORY_TRACT | 2 refills | Status: DC | PRN

## 2024-02-15 NOTE — Telephone Encounter (Signed)
 Last Fill: 09/02/22  Last OV: 11/22/23 NP Next OV: None Scheduled  Routing to provider for review/authorization.

## 2024-02-15 NOTE — Telephone Encounter (Signed)
 Please advise per Dr Washington Hacker last note she states continue Albuterol  inhaler, looks like weve never rxed her Albuterol  nebulizer.

## 2024-02-15 NOTE — Telephone Encounter (Signed)
 Pt has nebulizer machine already, rx sent to pharmacy

## 2024-02-15 NOTE — Telephone Encounter (Signed)
 Copied from CRM [id number]. Topic: Clinical - Medication Refill >> Feb 15, 2024  2:40 PM Justina Oman C wrote: Most Recent Pulmonary Care Visit:   Provider: Dr. Quillian Brunt Department: [facility] Pulmonary Care Date: 11/22/23  Medication: albuterol  (VENTOLIN  HFA) 108 (90 Base) MCG/ACT   Has the patient contacted their pharmacy? Yes, Stew pharmacist from [facility] [phone] is request a refill.  (Agent: If no, request that the patient contact the pharmacy for the refill. If patient does not wish to contact the pharmacy document the reason why and proceed with request.) (Agent: If yes, when and what did the pharmacy advise?)  This is the patient's preferred pharmacy:  [facility] [place], [place] - [address] [place] [address] Phone: [phone] Fax: [phone]  Is this the correct pharmacy for this prescription? Yes If no, delete pharmacy and type the correct one.   Has the prescription been filled recently? No  Is the patient out of the medication? Yes  Has the patient been seen for an appointment in the last year OR does the patient have an upcoming appointment? Yes  Can we respond through MyChart? No. Stew pharmacist from [facility] [phone] is request a refill, patient is out of medication.  Agent: Please be advised that Rx refills may take up to 3 business days. We ask that you follow-up with your pharmacy.

## 2024-02-15 NOTE — Telephone Encounter (Signed)
 We can send in order for nebulizer machine and albuterol  solution to use every 6 hours as needed for exacerbation of COPD

## 2024-02-21 ENCOUNTER — Other Ambulatory Visit (HOSPITAL_BASED_OUTPATIENT_CLINIC_OR_DEPARTMENT_OTHER): Payer: Self-pay

## 2024-02-21 MED ORDER — ALBUTEROL SULFATE HFA 108 (90 BASE) MCG/ACT IN AERS: INHALATION_SPRAY | RESPIRATORY_TRACT | 2 refills | Status: DC

## 2024-02-22 ENCOUNTER — Ambulatory Visit (INDEPENDENT_AMBULATORY_CARE_PROVIDER_SITE_OTHER): Payer: Medicare Other

## 2024-02-22 ENCOUNTER — Encounter: Payer: Self-pay | Admitting: Family Medicine

## 2024-02-22 ENCOUNTER — Ambulatory Visit (INDEPENDENT_AMBULATORY_CARE_PROVIDER_SITE_OTHER): Payer: Medicare Other | Admitting: Family Medicine

## 2024-02-22 VITALS — BP 105/65 | HR 89 | Temp 98.3°F | Resp 16 | Ht 63.0 in | Wt 121.6 lb

## 2024-02-22 VITALS — BP 116/64 | HR 78 | Temp 98.0°F | Wt 120.6 lb

## 2024-02-22 DIAGNOSIS — I5032 Chronic diastolic (congestive) heart failure: Secondary | ICD-10-CM

## 2024-02-22 DIAGNOSIS — I10 Essential (primary) hypertension: Secondary | ICD-10-CM | POA: Diagnosis not present

## 2024-02-22 DIAGNOSIS — M5441 Lumbago with sciatica, right side: Secondary | ICD-10-CM

## 2024-02-22 DIAGNOSIS — G8929 Other chronic pain: Secondary | ICD-10-CM | POA: Diagnosis not present

## 2024-02-22 DIAGNOSIS — J340 Abscess, furuncle and carbuncle of nose: Secondary | ICD-10-CM | POA: Diagnosis not present

## 2024-02-22 DIAGNOSIS — E785 Hyperlipidemia, unspecified: Secondary | ICD-10-CM | POA: Diagnosis not present

## 2024-02-22 DIAGNOSIS — R5383 Other fatigue: Secondary | ICD-10-CM | POA: Diagnosis not present

## 2024-02-22 MED ORDER — INCLISIRAN SODIUM 284 MG/1.5ML ~~LOC~~ SOSY
284.0000 mg | PREFILLED_SYRINGE | Freq: Once | SUBCUTANEOUS | Status: AC
  Administered 2024-02-22: 284 mg via SUBCUTANEOUS
  Filled 2024-02-22: qty 1.5

## 2024-02-22 NOTE — Progress Notes (Signed)
 Diagnosis: Hyperlipidemia  Provider:  Chilton Greathouse MD  Procedure: Injection  Leqvio (inclisiran), Dose: 284 mg, Site: subcutaneous, Number of injections: 1  Injection Site(s): Left lower quad. abdomen  Post Care: Patient declined observation  Discharge: Condition: Good, Destination: Home . AVS Provided  Performed by:  Loney Hering, LPN

## 2024-02-22 NOTE — Progress Notes (Signed)
 Established Patient Office Visit  Subjective   Patient ID: Catherine [name], female    DOB: August 19, 1935  Age: 89 y.o. MRN: [id number]  Chief Complaint  Patient presents with   Medical Management of Chronic Issues   Edema   Shortness of Breath    HPI   Catherine [name] has multiple medical problems including history of CAD, hypertension, diastolic heart failure, sick sinus syndrome, lumbar stenosis with ongoing severe daily claudication symptoms, hyperlipidemia, history of atrial fibrillation.  She has had prior history of CABG and mitral valve replacement.  History of statin intolerance.  She is here today to discuss several items as follows  Ongoing fatigue.  She attributes a lot of this to metoprolol .  She had been on 100 mg twice daily and currently down to 50 mg 1/2 tablet twice daily.  She feels like she is still having a lot of fatigue related to this.  She plans to discuss this further with cardiology.  She has had ongoing edema for several years.  Currently on daily Bumex  and takes once weekly Zaroxolyn .  Home weights reviewed and very consistently 116 to 118.  She has chronic shortness of breath which is unchanged Tries to elevate her feet periodically.  Ongoing severe low back pain.  Is taken hydrocodone  with very minimal relief.  She has seen back specialist and plans to get back injection again next Tuesday.  Complains of a "fluttering sensation "left ear.  No drainage.  No ear pain.  No acute hearing changes.  No increased dizziness.  She has had ongoing intermittent nosebleeds on the right side.  We had noted slightly scabbed area right anterior septum several months ago.  She was advised to use Bactroban  ointment twice daily and then recheck.  Not clear if she ever use that.  Husband states she frequently picks at this area with a Kleenex  Past Medical History:  Diagnosis Date   Acute on chronic diastolic heart failure (HCC)    AKI (acute kidney injury) (HCC) 06/22/2022    Aortic stenosis, mild 07/26/2017   Arthritis    Asthma    last attack 02/2015   Atrial fibrillation, chronic (HCC)    Cellulitis of left lower extremity 08/17/2016   Ulcer associated with severe venous insufficiency   Chronic anticoagulation    Chronic diastolic CHF (congestive heart failure) (HCC)    Chronic kidney disease    "RIGHT MANY KIDNEY INFECTIONS AND STONES"   COPD (chronic obstructive pulmonary disease) (HCC)    Coronary artery disease    Dizziness    H/O: rheumatic fever    Heart murmur    Hypertension    PONV (postoperative nausea and vomiting)    ' SOMETIMES', BUT NOT ALWAYS"   Primary hypertension    RHEUMATIC MITRAL STENOSIS 01/05/2008   Qualifier: Diagnosis of   By: Linder Revere MD, Clinton D        S/P Maze operation for atrial fibrillation 10/14/2016   Complete bilateral atrial lesion set using cryothermy and bipolar radiofrequency ablation - atrial appendage was not treated due to previous surgical procedure (open mitral commissurotomy)   S/P mitral valve replacement with bioprosthetic valve 10/14/2016   29 mm Medtronic Mosaic porcine bioprosthetic tissue valve   Sciatica 08/29/2023   Tricuspid regurgitation 07/14/2021   UTI (urinary tract infection)    Valvular heart disease    Has mitral stenosis with prior mitral commissurotomy in 1970   Past Surgical History:  Procedure Laterality Date   ABDOMINAL HYSTERECTOMY  1983  endometriosis   APPENDECTOMY     BACK SURGERY     neurosurgery x2   CARDIAC CATHETERIZATION     CARDIAC CATHETERIZATION N/A 08/03/2016   Procedure: Right/Left Heart Cath and Coronary Angiography;  Surgeon: Peter M Swaziland, MD;  Location: [facility] INVASIVE CV LAB;  Service: Cardiovascular;  Laterality: N/A;   cataract surg     CHOLECYSTECTOMY N/A 05/30/2015   Procedure: LAPAROSCOPIC CHOLECYSTECTOMY WITH INTRAOPERATIVE CHOLANGIOGRAM;  Surgeon: Ayesha Lente, MD;  Location: [facility] OR;  Service: General;  Laterality: N/A;   COLONOSCOPY     CORONARY  ARTERY BYPASS GRAFT N/A 10/14/2016   Procedure: CORONARY ARTERY BYPASS GRAFTING (CABG);  Surgeon: Gardenia Jump, MD;  Location: [facility] OR;  Service: Open Heart Surgery;  Laterality: N/A;   EYE SURGERY     MAZE N/A 10/14/2016   Procedure: MAZE;  Surgeon: Gardenia Jump, MD;  Location: [facility] OR;  Service: Open Heart Surgery;  Laterality: N/A;   MITRAL VALVE REPLACEMENT N/A 10/14/2016   Procedure: REDO MITRAL VALVE REPLACEMENT (MVR);  Surgeon: Gardenia Jump, MD;  Location: [facility] OR;  Service: Open Heart Surgery;  Laterality: N/A;   MITRAL VALVE SURGERY Left 1970   Open mitral commissurotomy via left thoracotomy approach   PACEMAKER IMPLANT N/A 09/22/2023   Procedure: PACEMAKER IMPLANT;  Surgeon: Ardeen Kohler, MD;  Location: [facility] INVASIVE CV LAB;  Service: Cardiovascular;  Laterality: N/A;   TEE WITHOUT CARDIOVERSION N/A 07/05/2016   Procedure: TRANSESOPHAGEAL ECHOCARDIOGRAM (TEE);  Surgeon: Maudine Sos, MD;  Location: [facility] ENDOSCOPY;  Service: Cardiovascular;  Laterality: N/A;   TEE WITHOUT CARDIOVERSION N/A 10/14/2016   Procedure: TRANSESOPHAGEAL ECHOCARDIOGRAM (TEE);  Surgeon: Gardenia Jump, MD;  Location: [facility] OR;  Service: Open Heart Surgery;  Laterality: N/A;    reports that she quit smoking about 48 years ago. Her smoking use included cigarettes. She started smoking about 63 years ago. She has a 15 pack-year smoking history. She has never used smokeless tobacco. She reports that she does not drink alcohol and does not use drugs. family history includes Leukemia in her father. Allergies  Allergen Reactions   Aldactone  [Spironolactone ] Other (See Comments)    dyspnea   Amoxicillin  Palpitations    Tachycardia   Amlodipine  Other (See Comments)    Low extremity edema   Crestor  [Rosuvastatin ] Other (See Comments)    myalgia   Diltiazem Other (See Comments)    Causing headaches    Flagyl  [Metronidazole  Hcl] Other (See Comments)    Causing headaches     Flovent  [Fluticasone  Propionate]  Other (See Comments)    Leg cramps   Gabapentin Swelling   Livalo  [Pitavastatin ] Other (See Comments)    myalgias   Lyrica [Pregabalin] Swelling   Quinidine Diarrhea and Other (See Comments)    Fever diarrhea   Simvastatin  Other (See Comments)    Leg pain, myalgia   Tramadol  Nausea Only   Verapamil  Other (See Comments)    myalgias   Zetia  [Ezetimibe ] Other (See Comments)    Myalgia   Benazepril Hcl Cough   Ciprofloxacin  Diarrhea   Codeine Nausea Only   Nitrofurantoin Monohyd Macro Nausea Only   Polymyxin B -Trimethoprim  Swelling    Review of Systems  Constitutional:  Positive for malaise/fatigue. Negative for chills, fever and weight loss.  Respiratory:  Positive for shortness of breath. Negative for cough and hemoptysis.   Cardiovascular:  Positive for leg swelling. Negative for chest pain and orthopnea.  Gastrointestinal:  Negative for abdominal pain.  Genitourinary:  Negative for dysuria.  Objective:     BP 116/64 (BP Location: Left Arm, Patient Position: Sitting, Cuff Size: Normal)   Pulse 78   Temp 98 F (36.7 C) (Oral)   Wt 120 lb 9.6 oz (54.7 kg)   SpO2 95%   BMI 21.36 kg/m  BP Readings from Last 3 Encounters:  02/22/24 116/64  01/03/24 126/68  12/26/23 110/62   Wt Readings from Last 3 Encounters:  02/22/24 120 lb 9.6 oz (54.7 kg)  01/03/24 119 lb (54 kg)  12/26/23 121 lb 12.8 oz (55.2 kg)      Physical Exam Vitals reviewed.  Constitutional:      General: She is not in acute distress.    Appearance: She is not ill-appearing.  HENT:     Ears:     Comments: Left ear canal and TM appear normal.    Nose:     Comments: Left naris appears normal.  Right naris-dry scabbed area approximately 6 x 8 mm right anterior nasal septum region.  No active bleeding or clots.    Mouth/Throat:     Mouth: Mucous membranes are moist.     Pharynx: Oropharynx is clear.  Cardiovascular:     Rate and Rhythm: Normal rate and regular rhythm.  Pulmonary:     Effort:  Pulmonary effort is normal.     Breath sounds: Normal breath sounds.  Musculoskeletal:     Right lower leg: Edema present.     Left lower leg: Edema present.     Comments: She has 1+ edema lower legs feet and ankles bilaterally.  Neurological:     Mental Status: She is alert.      No results found for any visits on 02/22/24.    The ASCVD Risk score (Arnett DK, et al., 2019) failed to calculate for the following reasons:   The 2019 ASCVD risk score is only valid for ages 79 to 79    Assessment & Plan:   #1 nonhealing scabbed area right anterior nasal septum region.  Not clear if patient has used Bactroban  previously on this.  Set up ENT referral as she has had issues with this for several months nonhealing  #2 chronic fatigue issues.  She has had recent thyroid  functions that were normal.  Recent hemoglobin 13.3 which is actually up from previous.  She is on metoprolol  and patient is fairly certain this is causing increased fatigue.  She will discuss with cardiologist to see if they would consider other possible medication options  #3 hypertension well-controlled by several home readings.  She has had occasional lightheadedness and not consistently  #4 chronic lower extremity edema with heart failure with preserved ejection fraction.  Patient on Bumex  and Zaroxolyn .  Followed by cardiology.  #5 chronic low back pain.  She has history of known lumbar stenosis.  She will be trying injection therapy next week.  We have had her on hydrocodone  to use as needed although not getting much relief with that   Return in about 3 months (around 05/24/2024).    Glean Lamy, MD

## 2024-02-22 NOTE — Patient Instructions (Signed)
 Use the Mupirocin  ointment and apply intranasally to right side twice daily.

## 2024-02-28 DIAGNOSIS — M5416 Radiculopathy, lumbar region: Secondary | ICD-10-CM | POA: Diagnosis not present

## 2024-03-15 ENCOUNTER — Other Ambulatory Visit: Payer: Self-pay | Admitting: Family Medicine

## 2024-03-15 ENCOUNTER — Telehealth: Payer: Self-pay | Admitting: Cardiovascular Disease

## 2024-03-15 NOTE — Telephone Encounter (Signed)
 Returned call to patient- explained provider recommendations!

## 2024-03-15 NOTE — Telephone Encounter (Signed)
 Pt c/o swelling/edema: STAT if pt has developed SOB within 24 hours  If swelling, where is the swelling located? Bi late LE Edema  How much weight have you gained and in what time span? 2 lbs in 2 days or so  Have you gained 2 pounds in a day or 5 pounds in a week? -lbs  Do you have a log of your daily weights (if so, list)? Yes- 119, 120, 118  Are you currently taking a fluid pill? yes  Are you currently SOB? On/off  Have you traveled recently in a car or plane for an extended period of time? No   Pt requesting to speak with Melinda

## 2024-03-15 NOTE — Telephone Encounter (Signed)
 Dry mouth occurs in 1% of individuals taking Metoprolol , low suspicion related. Recommend Biotene dry mouth mouthwash in the morning and evening. Would be unusual for her to suddenly develop headache on Metoprolol  as she has taken since 2018. My use Tylenol  as needed for headache.   Metoprolol  helps her heart to relax and work more efficiently. She has been on Metoprolol  since 2018. It would be unlikely for it to be causing adverse effects. She was on Metoprolol  Succinate which was switched back to Metoprolol  Tartrate which has been reduced as she tolerated better per her report.   If she wishes, may reduce to Metoprolol  tartrate 12.5mg  BID (will need Rx for 25mg  tablets to split in half).  Recommend she take a dose of her PRN Metolazone  tomorrow prior to Bumex . Ensure elevating legs, wearing compression stockings.   Callaghan Laverdure S Tc Kapusta, NP

## 2024-03-15 NOTE — Telephone Encounter (Signed)
 Returned call to pt-   Patient states she has had swelling in her feet and legs starting around 6/6.   6/5-120 117 116 117 x 3 days  119 118 117 118 6/18-120 6/19-119    She states even when her weight down her swelling does not go down. She states it looks lumpy and tight- like you could put it a pin in it and it would pop. She believes all these issues started with swelling when she was started on the Metoprolol  back in December after her pacemaker. She states she has all kinds of side effects- including dry mouth, headaches. She states she has called a few times and states that they just adjust the mg and it doesn't change anything.

## 2024-03-16 MED ORDER — BISOPROLOL FUMARATE 5 MG PO TABS
5.0000 mg | ORAL_TABLET | Freq: Every day | ORAL | 3 refills | Status: AC
Start: 2024-03-16 — End: ?

## 2024-03-16 NOTE — Telephone Encounter (Signed)
 Returned call to patient- advised patient of change- she will pick up and make the switch tomorrow.

## 2024-03-16 NOTE — Addendum Note (Signed)
 Addended by: Guss Legacy on: 03/16/2024 11:17 AM   Modules accepted: Orders

## 2024-03-21 ENCOUNTER — Telehealth: Payer: Self-pay | Admitting: Cardiology

## 2024-03-21 NOTE — Telephone Encounter (Signed)
 Pt c/o medication issue:  1. Name of Medication: bisoprolol  (ZEBETA ) 5 MG tablet   2. How are you currently taking this medication (dosage and times per day)? As written   3. Are you having a reaction (difficulty breathing--STAT)? Yes   4. What is your medication issue? Pt states she SOB from taking this medication.

## 2024-03-21 NOTE — Telephone Encounter (Signed)
 Stop Bisoprolol  (reports s/e on various BB formulations and doses). If swelling improved, continue present diuretic regimen. Recommend elevating legs, compression socks, low salt diet. Monitor BP, HR, weight daily and report back readings Monday.   Reche Finder NP

## 2024-03-21 NOTE — Telephone Encounter (Signed)
 Returned call to pt- provided provider recommendations. Pt will monitor and bring readings on Monday because she will be in the building.

## 2024-03-21 NOTE — Addendum Note (Signed)
 Addended by: TANDA ERMA HERO on: 03/21/2024 04:00 PM   Modules accepted: Orders

## 2024-03-21 NOTE — Telephone Encounter (Signed)
 Spoke with pt. Pt is not currently SOB but had a event this morning at 4am that woke her. She was extremely SOB and did a treatment of her inhaler. Pt is better now but feels it is related to the change to bisoprolol . Stated she started feeling more SOB over the weekend. Pt has not gained any weight and the swelling in her feet has went down but not completely gone. Pt stated she has not had to use inhaler since getting her PPM. Pt has taken no other new medications than the bisoprolol . Pt does not want to take bisoprolol  again. Pt stated Vannie has given her something in the past for swelling and it went away without all this happening. Advised Pt of 911/ED precautions if this happens again or new symptoms start. Tried to find a sooner Appt with APP but could not locate one as the schedulers also could not find one. Will route to providers for review and recommendations.

## 2024-03-23 ENCOUNTER — Ambulatory Visit (INDEPENDENT_AMBULATORY_CARE_PROVIDER_SITE_OTHER): Payer: Medicare Other

## 2024-03-23 DIAGNOSIS — I495 Sick sinus syndrome: Secondary | ICD-10-CM | POA: Diagnosis not present

## 2024-03-23 LAB — CUP PACEART REMOTE DEVICE CHECK
Battery Remaining Longevity: 113 mo
Battery Voltage: 3.09 V
Brady Statistic AP VP Percent: 96.8 %
Brady Statistic AP VS Percent: 0.42 %
Brady Statistic AS VP Percent: 1.11 %
Brady Statistic AS VS Percent: 1.67 %
Brady Statistic RA Percent Paced: 98.62 %
Brady Statistic RV Percent Paced: 97.91 %
Date Time Interrogation Session: 20250626231244
Implantable Lead Connection Status: 753985
Implantable Lead Connection Status: 753985
Implantable Lead Implant Date: 20241226
Implantable Lead Implant Date: 20241226
Implantable Lead Location: 753859
Implantable Lead Location: 753860
Implantable Lead Model: 3830
Implantable Lead Model: 4574
Implantable Pulse Generator Implant Date: 20241226
Lead Channel Impedance Value: 323 Ohm
Lead Channel Impedance Value: 342 Ohm
Lead Channel Impedance Value: 475 Ohm
Lead Channel Impedance Value: 494 Ohm
Lead Channel Pacing Threshold Amplitude: 0.5 V
Lead Channel Pacing Threshold Amplitude: 0.5 V
Lead Channel Pacing Threshold Pulse Width: 0.4 ms
Lead Channel Pacing Threshold Pulse Width: 0.4 ms
Lead Channel Sensing Intrinsic Amplitude: 0.75 mV
Lead Channel Sensing Intrinsic Amplitude: 0.75 mV
Lead Channel Sensing Intrinsic Amplitude: 5.75 mV
Lead Channel Sensing Intrinsic Amplitude: 5.75 mV
Lead Channel Setting Pacing Amplitude: 2 V
Lead Channel Setting Pacing Amplitude: 2.75 V
Lead Channel Setting Pacing Pulse Width: 0.4 ms
Lead Channel Setting Sensing Sensitivity: 1.2 mV
Zone Setting Status: 755011
Zone Setting Status: 755011

## 2024-03-24 ENCOUNTER — Other Ambulatory Visit: Payer: Self-pay

## 2024-03-24 ENCOUNTER — Encounter (HOSPITAL_BASED_OUTPATIENT_CLINIC_OR_DEPARTMENT_OTHER): Payer: Self-pay | Admitting: Emergency Medicine

## 2024-03-24 ENCOUNTER — Inpatient Hospital Stay (HOSPITAL_BASED_OUTPATIENT_CLINIC_OR_DEPARTMENT_OTHER)
Admission: EM | Admit: 2024-03-24 | Discharge: 2024-03-27 | DRG: 291 | Disposition: A | Discharge status: Home / Self Care | Attending: Internal Medicine | Admitting: Internal Medicine

## 2024-03-24 ENCOUNTER — Emergency Department (HOSPITAL_BASED_OUTPATIENT_CLINIC_OR_DEPARTMENT_OTHER)

## 2024-03-24 DIAGNOSIS — Z950 Presence of cardiac pacemaker: Secondary | ICD-10-CM

## 2024-03-24 DIAGNOSIS — E875 Hyperkalemia: Secondary | ICD-10-CM | POA: Diagnosis present

## 2024-03-24 DIAGNOSIS — J9601 Acute respiratory failure with hypoxia: Secondary | ICD-10-CM | POA: Diagnosis present

## 2024-03-24 DIAGNOSIS — R051 Acute cough: Secondary | ICD-10-CM

## 2024-03-24 DIAGNOSIS — Z87891 Personal history of nicotine dependence: Secondary | ICD-10-CM | POA: Diagnosis not present

## 2024-03-24 DIAGNOSIS — R5381 Other malaise: Secondary | ICD-10-CM | POA: Diagnosis not present

## 2024-03-24 DIAGNOSIS — N179 Acute kidney failure, unspecified: Secondary | ICD-10-CM | POA: Diagnosis present

## 2024-03-24 DIAGNOSIS — Z953 Presence of xenogenic heart valve: Secondary | ICD-10-CM

## 2024-03-24 DIAGNOSIS — I2510 Atherosclerotic heart disease of native coronary artery without angina pectoris: Secondary | ICD-10-CM | POA: Diagnosis not present

## 2024-03-24 DIAGNOSIS — Z7901 Long term (current) use of anticoagulants: Secondary | ICD-10-CM | POA: Diagnosis not present

## 2024-03-24 DIAGNOSIS — I482 Chronic atrial fibrillation, unspecified: Secondary | ICD-10-CM | POA: Diagnosis present

## 2024-03-24 DIAGNOSIS — R0602 Shortness of breath: Secondary | ICD-10-CM | POA: Diagnosis not present

## 2024-03-24 DIAGNOSIS — Z66 Do not resuscitate: Secondary | ICD-10-CM | POA: Diagnosis not present

## 2024-03-24 DIAGNOSIS — Z951 Presence of aortocoronary bypass graft: Secondary | ICD-10-CM | POA: Diagnosis not present

## 2024-03-24 DIAGNOSIS — I5033 Acute on chronic diastolic (congestive) heart failure: Secondary | ICD-10-CM | POA: Diagnosis present

## 2024-03-24 DIAGNOSIS — Z79899 Other long term (current) drug therapy: Secondary | ICD-10-CM | POA: Diagnosis not present

## 2024-03-24 DIAGNOSIS — J810 Acute pulmonary edema: Secondary | ICD-10-CM | POA: Diagnosis not present

## 2024-03-24 DIAGNOSIS — R600 Localized edema: Secondary | ICD-10-CM

## 2024-03-24 DIAGNOSIS — R0902 Hypoxemia: Secondary | ICD-10-CM | POA: Diagnosis not present

## 2024-03-24 DIAGNOSIS — I480 Paroxysmal atrial fibrillation: Secondary | ICD-10-CM | POA: Diagnosis present

## 2024-03-24 DIAGNOSIS — J189 Pneumonia, unspecified organism: Secondary | ICD-10-CM | POA: Diagnosis present

## 2024-03-24 DIAGNOSIS — J440 Chronic obstructive pulmonary disease with acute lower respiratory infection: Secondary | ICD-10-CM | POA: Diagnosis present

## 2024-03-24 DIAGNOSIS — I130 Hypertensive heart and chronic kidney disease with heart failure and stage 1 through stage 4 chronic kidney disease, or unspecified chronic kidney disease: Secondary | ICD-10-CM | POA: Diagnosis not present

## 2024-03-24 DIAGNOSIS — E785 Hyperlipidemia, unspecified: Secondary | ICD-10-CM | POA: Diagnosis present

## 2024-03-24 DIAGNOSIS — R918 Other nonspecific abnormal finding of lung field: Secondary | ICD-10-CM | POA: Diagnosis not present

## 2024-03-24 DIAGNOSIS — M6281 Muscle weakness (generalized): Secondary | ICD-10-CM | POA: Diagnosis present

## 2024-03-24 DIAGNOSIS — J9611 Chronic respiratory failure with hypoxia: Secondary | ICD-10-CM | POA: Insufficient documentation

## 2024-03-24 DIAGNOSIS — Z90710 Acquired absence of both cervix and uterus: Secondary | ICD-10-CM

## 2024-03-24 DIAGNOSIS — I5032 Chronic diastolic (congestive) heart failure: Secondary | ICD-10-CM

## 2024-03-24 DIAGNOSIS — J90 Pleural effusion, not elsewhere classified: Secondary | ICD-10-CM | POA: Diagnosis not present

## 2024-03-24 DIAGNOSIS — R0989 Other specified symptoms and signs involving the circulatory and respiratory systems: Secondary | ICD-10-CM | POA: Diagnosis not present

## 2024-03-24 DIAGNOSIS — N1831 Chronic kidney disease, stage 3a: Secondary | ICD-10-CM | POA: Diagnosis not present

## 2024-03-24 LAB — CBC
HCT: 36.3 % (ref 36.0–46.0)
Hemoglobin: 11.9 g/dL — ABNORMAL LOW (ref 12.0–15.0)
MCH: 30.3 pg (ref 26.0–34.0)
MCHC: 32.8 g/dL (ref 30.0–36.0)
MCV: 92.4 fL (ref 80.0–100.0)
Platelets: 166 10*3/uL (ref 150–400)
RBC: 3.93 MIL/uL (ref 3.87–5.11)
RDW: 13.3 % (ref 11.5–15.5)
WBC: 8 10*3/uL (ref 4.0–10.5)
nRBC: 0 % (ref 0.0–0.2)

## 2024-03-24 LAB — BASIC METABOLIC PANEL WITH GFR
Anion gap: 11 (ref 5–15)
BUN: 26 mg/dL — ABNORMAL HIGH (ref 8–23)
CO2: 24 mmol/L (ref 22–32)
Calcium: 9.3 mg/dL (ref 8.9–10.3)
Chloride: 106 mmol/L (ref 98–111)
Creatinine, Ser: 1.19 mg/dL — ABNORMAL HIGH (ref 0.44–1.00)
GFR, Estimated: 44 mL/min — ABNORMAL LOW (ref >60)
Glucose, Bld: 105 mg/dL — ABNORMAL HIGH (ref 70–99)
Potassium: 5.4 mmol/L — ABNORMAL HIGH (ref 3.5–5.1)
Sodium: 140 mmol/L (ref 135–145)

## 2024-03-24 LAB — RESP PANEL BY RT-PCR (RSV, FLU A&B, COVID)  RVPGX2
Influenza A by PCR: NEGATIVE
Influenza B by PCR: NEGATIVE
Resp Syncytial Virus by PCR: NEGATIVE
SARS Coronavirus 2 by RT PCR: NEGATIVE

## 2024-03-24 LAB — LACTIC ACID, PLASMA: Lactic Acid, Venous: 1 mmol/L (ref 0.5–1.9)

## 2024-03-24 LAB — PRO BRAIN NATRIURETIC PEPTIDE: Pro Brain Natriuretic Peptide: 5712 pg/mL — ABNORMAL HIGH (ref <300.0)

## 2024-03-24 LAB — TSH: TSH: 4.18 u[IU]/mL (ref 0.350–4.500)

## 2024-03-24 LAB — TROPONIN T, HIGH SENSITIVITY: Troponin T High Sensitivity: 41 ng/L — ABNORMAL HIGH (ref <19)

## 2024-03-24 MED ORDER — ALBUTEROL SULFATE (2.5 MG/3ML) 0.083% IN NEBU
INHALATION_SOLUTION | RESPIRATORY_TRACT | Status: AC
  Filled 2024-03-24: qty 3

## 2024-03-24 MED ORDER — ALBUTEROL SULFATE (2.5 MG/3ML) 0.083% IN NEBU
2.5000 mg | INHALATION_SOLUTION | Freq: Once | RESPIRATORY_TRACT | Status: AC
  Administered 2024-03-24: 2.5 mg via RESPIRATORY_TRACT

## 2024-03-24 MED ORDER — SODIUM CHLORIDE 0.9 % IV SOLN
2.0000 g | Freq: Once | INTRAVENOUS | Status: AC
  Administered 2024-03-24: 2 g via INTRAVENOUS
  Filled 2024-03-24: qty 12.5

## 2024-03-24 NOTE — ED Triage Notes (Signed)
 Gradually been more and more congested this week and today can't breathe, pt is visibly short of breath.

## 2024-03-24 NOTE — ED Provider Notes (Signed)
 [place] EMERGENCY DEPARTMENT AT [facility] Provider Note   CSN: [id number] Arrival date & time: 03/24/24  1831     Patient presents with: No chief complaint on file.   Catherine [name] is a 89 y.o. female.  {Add pertinent medical, surgical, social history, OB history to [id number]} The history is provided by the patient, medical records and a relative. No language interpreter was used.  Shortness of Breath Severity:  Severe Onset quality:  Gradual Duration:  1 week Timing:  Intermittent Progression:  Waxing and waning Chronicity:  New Context: URI   Relieved by:  Nothing Worsened by:  Coughing and exertion Ineffective treatments:  None tried Associated symptoms: cough and sputum production   Associated symptoms: no abdominal pain, no chest pain, no fever, no headaches, no hemoptysis, no neck pain, no vomiting and no wheezing        Prior to Admission medications   Medication Sig Start Date End Date Taking? Authorizing Provider  acetaminophen  (TYLENOL ) 500 MG tablet Take 500 mg by mouth 2 (two) times daily.    [provider]  albuterol  (PROVENTIL ) (2.5 MG/3ML) 0.083% nebulizer solution Take 3 mLs (2.5 mg total) by nebulization every 6 (six) hours as needed (during COPD Exacerbation). 02/15/24 02/14/25  Hope Almarie ORN, NP  albuterol  (VENTOLIN  HFA) 108 (90 Base) MCG/ACT inhaler INHALE 2 PUFFS INTO THE LUNGS EVERY 4 HOURS AS NEEDED FOR AHEEZING OR SHORTNESS OF BREATH 02/21/24   Parrett, Madelin RAMAN, NP  apixaban  (ELIQUIS ) 2.5 MG TABS tablet TAKE ONE TABLET TWICE DAILY (START 09/18/23 OR AS DIRECTED) 10/24/23   Burchette, Wolm ORN, MD  bumetanide  (BUMEX ) 2 MG tablet Take 1 tablet (2 mg total) by mouth daily. 11/08/23   Lesia Ozell Barter, PA-C  cholecalciferol (VITAMIN D3) 25 MCG (1000 UNIT) tablet Take 1,000 Units by mouth daily.    [provider]  clindamycin  (CLEOCIN ) 300 MG capsule Take two capsules one hour prior to dental procedure. 11/22/23    Burchette, Wolm ORN, MD  diazepam  (VALIUM ) 5 MG tablet TAKE ONE TABLET BY MOUTH AT BEDTIME 12/12/23   Burchette, Wolm ORN, MD  estradiol  (ESTRACE ) 0.1 MG/GM vaginal cream Place 1 Applicatorful vaginally 3 (three) times a week.    [provider]  fluconazole  (DIFLUCAN ) 150 MG tablet Take one tablet times one dose as needed for yeast vaginitis. 11/22/23   Burchette, Wolm ORN, MD  fluticasone  (FLONASE ) 50 MCG/ACT nasal spray Place 1 spray into both nostrils daily as needed for allergies or rhinitis. 08/30/22   Sood, Vineet, MD  HYDROcodone -acetaminophen  (NORCO/VICODIN) 5-325 MG tablet TAKE ONE TABLET EVERY 8 HOURS AS NEEDED FOR PAIN 02/13/24   Burchette, Wolm ORN, MD  metolazone  (ZAROXOLYN ) 5 MG tablet Take 1 tablet (5 mg total) by mouth as directed. TAKE ONE TABLET AS NEEDED ONCE A WEEK 30 MINUTES BEFORE BUMEX  DOSE. AS NEEDED ONLY FOR WEIGHT GAIN 2LBS OVERNIGHT OR 5 LBS IN ONE WEEK 01/03/24   Walker, Caitlin S, NP  Multiple Vitamins-Minerals (HAIR/SKIN/NAILS/BIOTIN PO) Take 1 tablet by mouth daily.    [provider]  Multiple Vitamins-Minerals (MULTIVITAMIN ADULT EXTRA C) CHEW Chew 2 tablets by mouth daily.    [provider]  mupirocin  ointment (BACTROBAN ) 2 % APPLY TOPICALLY ONCE A DAY 03/16/24   Burchette, Wolm ORN, MD  potassium chloride  (KLOR-CON ) 10 MEQ tablet Take 10 mEq by mouth as directed. TAKE 4 TABLETS TWICE A DAY    [provider]  sacubitril -valsartan  (ENTRESTO ) 24-26 MG Take 1 tablet by mouth 2 (  two) times daily. 07/27/23   Raford Riggs, MD    Allergies: Aldactone  [spironolactone ], Amoxicillin , Bisoprolol , Amlodipine , Crestor  [rosuvastatin ], Diltiazem, Flagyl  [metronidazole  hcl], Flovent  [fluticasone  propionate], Gabapentin, Livalo  [pitavastatin ], Lyrica [pregabalin], Quinidine, Simvastatin , Tramadol , Verapamil , Zetia  [ezetimibe ], Benazepril hcl, Ciprofloxacin , Codeine, Nitrofurantoin monohyd macro, and Polymyxin b -trimethoprim     Review of Systems   Constitutional:  Positive for fatigue. Negative for chills and fever.  HENT:  Positive for congestion.   Respiratory:  Positive for cough, sputum production, chest tightness and shortness of breath. Negative for hemoptysis, wheezing and stridor.   Cardiovascular:  Positive for leg swelling. Negative for chest pain and palpitations.  Gastrointestinal:  Positive for nausea. Negative for abdominal pain, constipation, diarrhea and vomiting.  Genitourinary:  Negative for dysuria.  Musculoskeletal:  Negative for back pain and neck pain.  Skin:  Negative for wound.  Neurological:  Negative for headaches.  Psychiatric/Behavioral:  Negative for agitation.   All other systems reviewed and are negative.   Updated Vital Signs BP (!) 104/45   Pulse 68   Temp 98.1 F (36.7 C) (Oral)   Resp (!) 28   Wt 56.9 kg   SpO2 97%   BMI 22.22 kg/m   Physical Exam Vitals and nursing note reviewed.  Constitutional:      General: She is not in acute distress.    Appearance: She is well-developed. She is not ill-appearing, toxic-appearing or diaphoretic.  HENT:     Head: Normocephalic and atraumatic.     Nose: Nose normal.     Mouth/Throat:     Mouth: Mucous membranes are moist.     Pharynx: No oropharyngeal exudate or posterior oropharyngeal erythema.   Eyes:     Conjunctiva/sclera: Conjunctivae normal.    Cardiovascular:     Rate and Rhythm: Normal rate and regular rhythm.     Heart sounds: Murmur heard.  Pulmonary:     Effort: Pulmonary effort is normal. No respiratory distress.     Breath sounds: Rhonchi and rales present. No wheezing.  Chest:     Chest wall: Tenderness present.  Abdominal:     Palpations: Abdomen is soft.     Tenderness: There is no abdominal tenderness. There is no guarding or rebound.   Musculoskeletal:        General: No swelling or tenderness.     Cervical back: Neck supple. No tenderness.     Right lower leg: Edema present.     Left lower leg: Edema present.    Skin:    General: Skin is warm and dry.     Capillary Refill: Capillary refill takes less than 2 seconds.     Findings: No erythema.   Neurological:     General: No focal deficit present.     Mental Status: She is alert.   Psychiatric:        Mood and Affect: Mood normal.     (all labs ordered are listed, but only abnormal results are displayed) Labs Reviewed  BASIC METABOLIC PANEL WITH GFR - Abnormal; Notable for the following components:      Result Value   Potassium 5.4 (*)    Glucose, Bld 105 (*)    BUN 26 (*)    Creatinine, Ser 1.19 (*)    GFR, Estimated 44 (*)    All other components within normal limits  CBC - Abnormal; Notable for the following components:   Hemoglobin 11.9 (*)    All other components within normal limits  CULTURE, BLOOD (ROUTINE X 2)  CULTURE,  BLOOD (ROUTINE X 2)  PRO BRAIN NATRIURETIC PEPTIDE  LACTIC ACID, PLASMA  LACTIC ACID, PLASMA  TSH  TROPONIN T, HIGH SENSITIVITY    EKG: EKG Interpretation Date/Time:  Saturday March 24 2024 18:43:51 EDT Ventricular Rate:  67 PR Interval:    QRS Duration:  99 QT Interval:  433 QTC Calculation: 458 R Axis:   30  Text Interpretation: paced rhythm Anteroseptal infarct, old Repol abnrm suggests ischemia, diffuse leads when compared to prior, similar paced rhythm no STEMI Confirmed by Ginger Barefoot ([id number]) on 03/24/2024 10:26:52 PM  Radiology: [facility] Chest Port 1 View Result Date: 03/24/2024 CLINICAL DATA:  Congestion and shortness of breath. EXAM: PORTABLE CHEST 1 VIEW COMPARISON:  October 03, 2023 FINDINGS: There is stable dual lead AICD positioning. Multiple sternal wires are noted. The cardiac silhouette is mildly enlarged and unchanged in size. Mild diffusely increased interstitial lung markings are seen which may be, in part, chronic in nature. Mild to moderate severity areas of atelectasis and/or infiltrate are noted within the bilateral lung bases. There is a small bilateral pleural effusions, right  greater than left. No pneumothorax is identified. Multilevel degenerative changes are seen throughout the thoracic spine. IMPRESSION: 1. Stable cardiomegaly with mild to moderate severity bibasilar atelectasis and/or infiltrate. 2. Small bilateral pleural effusions. Electronically Signed   By: Suzen Dials M.D.   On: 03/24/2024 19:17   CUP PACEART REMOTE DEVICE CHECK Result Date: 03/23/2024 PPM scheduled remote reviewed. Normal device function.  Presenting rhythm: AP-VP Next remote 91 days. AB, CVRS   {Document cardiac monitor, telemetry assessment procedure when appropriate:32947} Procedures   Medications Ordered in the ED  ceFEPIme (MAXIPIME) 2 g in sodium chloride  0.9 % 100 mL IVPB (has no administration in time range)  albuterol  (PROVENTIL ) (2.5 MG/3ML) 0.083% nebulizer solution 2.5 mg (2.5 mg Nebulization Given 03/24/24 1848)      {Click here for ABCD2, HEART and other calculators REFRESH Note before signing:1}                              Medical Decision Making Amount and/or Complexity of Data Reviewed Labs: ordered. Radiology: ordered.  Risk Prescription drug management.    Mayumi CHASEY DULL is a 89 y.o. female with a past medical history significant for rheumatic heart disease with valve replacement, sick sinus syndrome with pacemaker, CHF, CKD, COPD, CAD status post CABG, A-fib status post maze operation, hypertension, and previous cholecystectomy/hysterectomy/appendectomy who presents with worsening shortness of breath, productive cough, low blood pressures at home, and fatigue.  According to patient and family, patient has had some difficulty with her breathing ever since having pacemaker placed back in December.  She says that she has been battling with peripheral edema but it is still present.  She says that over the last week she has had more congestion and cough and feels like she is having phlegm that she is getting some but not everything.  No reported hemoptysis.   Family says that when she is talking she gets to where she cannot complete her sentences and has to stop talking due to gasping for breath.  They are describing air hunger.  She documented her blood pressures and they have been low over the last few days in the 90s and even into the 80 systolic earlier today.  She is denying any fevers or chills however she is feeling very tired with his cough.  She is denying any vomiting but has had some  nausea.  Denies any chest pain at this time.  Denies any constipation, diarrhea, or urinary changes.  She has been taking all her medicines and diuretics but is concerned her breathing is not improving.  On exam, patient has rales, diminished breath sounds bilaterally in the bases and coarseness.  There is minimal wheezing.  Her chest was tender to palpation on the left side.  Abdomen nontender with good bowel sounds.  Legs are edematous bilaterally but not focally tender.  No focal neurologic deficits on my exam.  EKG does not show STEMI.  It shows a paced rhythm similar to prior.  When talking, patient's oxygen  saturations did dip into the 80s at times and she was more visibly short of breath.  She will be placed on 2 L nasal cannula.  Patient had some workup starting in triage including a chest x-ray CBC and BMP.  Her chest x-ray shows evidence of some pleural effusions and evidence of infiltrate.  Given her productive cough, soft pressures, tachypnea, and intermittent hypoxia, feel she needs antibiotics and admission.  Due to the edema we will add on a BNP and a troponin and we will also get the lactic acid and TSH with her fatigue.  Her metabolic panel showed elevation in potassium but creatinine similar to prior at 1.1.  Will call for admission for pneumonia and fluid overload causing increased work of breathing, air hunger, and intermittent hypoxia.  I called and spoke to pharmacy to get appropriate antibiotic given her intolerances and allergies and they  recommended cefepime.  Of note, patient does states she gets yeast infections with antibiotics so may need inpatient antifungal medication after discussion with the team.  Will admit for further management.  Due to her heart failure history and cardiac history patient may need to go to [facility] in case cardiology needs to be involved.  {Document critical care time when appropriate  Document review of labs and clinical decision tools ie CHADS2VASC2, etc  Document your independent review of radiology images and any outside records  Document your discussion with family members, caretakers and with consultants  Document social determinants of health affecting pt's care  Document your decision making why or why not admission, treatments were needed:32947:::1}   Final diagnoses:  Hypoxia  Pneumonia due to infectious organism, unspecified laterality, unspecified part of lung  Peripheral edema  Acute cough     Clinical Impression: 1. Hypoxia   2. Pneumonia due to infectious organism, unspecified laterality, unspecified part of lung   3. Peripheral edema   4. Acute cough     Disposition: Admit  This note was prepared with assistance of Dragon voice recognition software. Occasional wrong-word or sound-a-like substitutions may have occurred due to the inherent limitations of voice recognition software.

## 2024-03-25 ENCOUNTER — Ambulatory Visit: Payer: Self-pay | Admitting: Cardiology

## 2024-03-25 DIAGNOSIS — I482 Chronic atrial fibrillation, unspecified: Secondary | ICD-10-CM | POA: Diagnosis not present

## 2024-03-25 DIAGNOSIS — Z7901 Long term (current) use of anticoagulants: Secondary | ICD-10-CM | POA: Diagnosis not present

## 2024-03-25 DIAGNOSIS — I2510 Atherosclerotic heart disease of native coronary artery without angina pectoris: Secondary | ICD-10-CM | POA: Diagnosis present

## 2024-03-25 DIAGNOSIS — Z66 Do not resuscitate: Secondary | ICD-10-CM | POA: Diagnosis present

## 2024-03-25 DIAGNOSIS — Z87891 Personal history of nicotine dependence: Secondary | ICD-10-CM | POA: Diagnosis not present

## 2024-03-25 DIAGNOSIS — J9601 Acute respiratory failure with hypoxia: Secondary | ICD-10-CM | POA: Diagnosis not present

## 2024-03-25 DIAGNOSIS — Z951 Presence of aortocoronary bypass graft: Secondary | ICD-10-CM | POA: Diagnosis not present

## 2024-03-25 DIAGNOSIS — E875 Hyperkalemia: Secondary | ICD-10-CM | POA: Diagnosis not present

## 2024-03-25 DIAGNOSIS — J440 Chronic obstructive pulmonary disease with acute lower respiratory infection: Secondary | ICD-10-CM | POA: Diagnosis present

## 2024-03-25 DIAGNOSIS — Z950 Presence of cardiac pacemaker: Secondary | ICD-10-CM | POA: Diagnosis not present

## 2024-03-25 DIAGNOSIS — E785 Hyperlipidemia, unspecified: Secondary | ICD-10-CM | POA: Diagnosis present

## 2024-03-25 DIAGNOSIS — Z79899 Other long term (current) drug therapy: Secondary | ICD-10-CM | POA: Diagnosis not present

## 2024-03-25 DIAGNOSIS — M6281 Muscle weakness (generalized): Secondary | ICD-10-CM | POA: Diagnosis present

## 2024-03-25 DIAGNOSIS — Z90710 Acquired absence of both cervix and uterus: Secondary | ICD-10-CM | POA: Diagnosis not present

## 2024-03-25 DIAGNOSIS — J189 Pneumonia, unspecified organism: Secondary | ICD-10-CM | POA: Diagnosis present

## 2024-03-25 DIAGNOSIS — R600 Localized edema: Secondary | ICD-10-CM | POA: Diagnosis not present

## 2024-03-25 DIAGNOSIS — R5381 Other malaise: Secondary | ICD-10-CM | POA: Diagnosis present

## 2024-03-25 DIAGNOSIS — N1831 Chronic kidney disease, stage 3a: Secondary | ICD-10-CM | POA: Diagnosis present

## 2024-03-25 DIAGNOSIS — R0902 Hypoxemia: Secondary | ICD-10-CM | POA: Diagnosis present

## 2024-03-25 DIAGNOSIS — Z953 Presence of xenogenic heart valve: Secondary | ICD-10-CM | POA: Diagnosis not present

## 2024-03-25 DIAGNOSIS — I130 Hypertensive heart and chronic kidney disease with heart failure and stage 1 through stage 4 chronic kidney disease, or unspecified chronic kidney disease: Secondary | ICD-10-CM | POA: Diagnosis present

## 2024-03-25 DIAGNOSIS — I480 Paroxysmal atrial fibrillation: Secondary | ICD-10-CM | POA: Diagnosis present

## 2024-03-25 DIAGNOSIS — N179 Acute kidney failure, unspecified: Secondary | ICD-10-CM | POA: Diagnosis not present

## 2024-03-25 DIAGNOSIS — I5033 Acute on chronic diastolic (congestive) heart failure: Secondary | ICD-10-CM | POA: Diagnosis not present

## 2024-03-25 LAB — COMPREHENSIVE METABOLIC PANEL WITH GFR
ALT: 11 U/L (ref 0–44)
AST: 31 U/L (ref 15–41)
Albumin: 2.3 g/dL — ABNORMAL LOW (ref 3.5–5.0)
Alkaline Phosphatase: 80 U/L (ref 38–126)
Anion gap: 16 — ABNORMAL HIGH (ref 5–15)
BUN: 23 mg/dL (ref 8–23)
CO2: 22 mmol/L (ref 22–32)
Calcium: 8.8 mg/dL — ABNORMAL LOW (ref 8.9–10.3)
Chloride: 103 mmol/L (ref 98–111)
Creatinine, Ser: 1.02 mg/dL — ABNORMAL HIGH (ref 0.44–1.00)
GFR, Estimated: 54 mL/min — ABNORMAL LOW (ref >60)
Glucose, Bld: 109 mg/dL — ABNORMAL HIGH (ref 70–99)
Potassium: 4.6 mmol/L (ref 3.5–5.1)
Sodium: 141 mmol/L (ref 135–145)
Total Bilirubin: 1.1 mg/dL (ref 0.0–1.2)
Total Protein: 4.9 g/dL — ABNORMAL LOW (ref 6.5–8.1)

## 2024-03-25 LAB — TROPONIN T, HIGH SENSITIVITY: Troponin T High Sensitivity: 39 ng/L — ABNORMAL HIGH (ref <19)

## 2024-03-25 LAB — PROCALCITONIN: Procalcitonin: <0.1 ng/mL

## 2024-03-25 LAB — LACTIC ACID, PLASMA: Lactic Acid, Venous: 0.9 mmol/L (ref 0.5–1.9)

## 2024-03-25 MED ORDER — AZITHROMYCIN 250 MG PO TABS
500.0000 mg | ORAL_TABLET | Freq: Every day | ORAL | Status: AC
  Administered 2024-03-25 – 2024-03-27 (×3): 500 mg via ORAL
  Filled 2024-03-25 (×3): qty 2

## 2024-03-25 MED ORDER — FUROSEMIDE 10 MG/ML IJ SOLN
40.0000 mg | Freq: Two times a day (BID) | INTRAMUSCULAR | Status: DC
  Administered 2024-03-25: 40 mg via INTRAVENOUS
  Filled 2024-03-25 (×2): qty 4

## 2024-03-25 MED ORDER — SODIUM CHLORIDE 0.9 % IV SOLN
1.0000 g | INTRAVENOUS | Status: DC
  Administered 2024-03-26 (×2): 1 g via INTRAVENOUS
  Filled 2024-03-25 (×2): qty 10

## 2024-03-25 MED ORDER — GUAIFENESIN ER 600 MG PO TB12
600.0000 mg | ORAL_TABLET | Freq: Two times a day (BID) | ORAL | Status: DC
  Administered 2024-03-25 – 2024-03-27 (×5): 600 mg via ORAL
  Filled 2024-03-25 (×5): qty 1

## 2024-03-25 MED ORDER — ACETAMINOPHEN 325 MG PO TABS
650.0000 mg | ORAL_TABLET | Freq: Four times a day (QID) | ORAL | Status: DC | PRN
  Administered 2024-03-26 – 2024-03-27 (×2): 650 mg via ORAL
  Filled 2024-03-25 (×2): qty 2

## 2024-03-25 MED ORDER — APIXABAN 2.5 MG PO TABS
2.5000 mg | ORAL_TABLET | Freq: Two times a day (BID) | ORAL | Status: DC
  Administered 2024-03-25 – 2024-03-27 (×5): 2.5 mg via ORAL
  Filled 2024-03-25 (×5): qty 1

## 2024-03-25 MED ORDER — ALBUTEROL SULFATE (2.5 MG/3ML) 0.083% IN NEBU: 2.5000 mg | INHALATION_SOLUTION | Freq: Once | RESPIRATORY_TRACT | Status: DC

## 2024-03-25 MED ORDER — ALBUTEROL SULFATE (2.5 MG/3ML) 0.083% IN NEBU: 2.5000 mg | INHALATION_SOLUTION | Freq: Four times a day (QID) | RESPIRATORY_TRACT | Status: DC | PRN

## 2024-03-25 MED ORDER — HYDROCODONE-ACETAMINOPHEN 5-325 MG PO TABS
1.0000 | ORAL_TABLET | Freq: Four times a day (QID) | ORAL | Status: DC | PRN
  Administered 2024-03-26: 1 via ORAL
  Filled 2024-03-25: qty 1

## 2024-03-25 MED ORDER — SACUBITRIL-VALSARTAN 24-26 MG PO TABS
1.0000 | ORAL_TABLET | Freq: Two times a day (BID) | ORAL | Status: DC
  Filled 2024-03-25: qty 1

## 2024-03-25 MED ORDER — ENOXAPARIN SODIUM 30 MG/0.3ML IJ SOSY: 30.0000 mg | PREFILLED_SYRINGE | Freq: Every day | INTRAMUSCULAR | Status: DC

## 2024-03-25 NOTE — H&P (Addendum)
 History and Physical    Patient: Catherine [name] FMW:[id number] DOB: 1935-06-05 DOA: 03/24/2024 DOS: the patient was seen and examined on 03/25/2024 PCP: Micheal Wolm ORN, MD  Patient coming from: Home  Chief Complaint: No chief complaint on file.  HPI: Catherine [name] is a 89 y.o. female with medical history significant of paroxysmal A-fib on Coumadin , severe mitral stenosis status post bioprosthetic mitral valve replacement, coronary artery disease status post single-vessel CABG, hypertension, hyperlipidemia with statin and Zetia  intolerance, chronic HFpEF (EF 65-70% in 10/2023), chronic back pain with prior back surgeries, chronic insomnia, and CKD 3A p/w SOB iso CAP c/b ADHF on admission XR.  Pt states that she was in her USOH until 2 days ago when her SOB worsened. Pt reports feeling unwell for the past two days, having increased lethargy, and SOB that has prevented her from cooking meals. Of note, she does not follow a low salt diet and cook salted meals 3x/week and eats out the remaining days. Per her report, pt states that she has felt worse since having her PPM placed in 08/2023, as she lost a significant amount of weight that admission and has been unable to get back to where she was prior to that admission.    At the time of admission, pt was AFVSS. Labs notable for K 5.4, Cr 1.19, and BNP 5712, and troponin 41-->39. CXR c/f bibasilar infiltrate as well as b/l pleural effusions. Pt started on IV cefepime and admitted to medicine for ongoing care.  Review of Systems: As mentioned in the history of present illness. All other systems reviewed and are negative. Past Medical History:  Diagnosis Date   Acute on chronic diastolic heart failure (HCC)    AKI (acute kidney injury) (HCC) 06/22/2022   Aortic stenosis, mild 07/26/2017   Arthritis    Asthma    last attack 02/2015   Atrial fibrillation, chronic (HCC)    Cellulitis of left lower extremity 08/17/2016   Ulcer associated with  severe venous insufficiency   Chronic anticoagulation    Chronic diastolic CHF (congestive heart failure) (HCC)    Chronic kidney disease    RIGHT MANY KIDNEY INFECTIONS AND STONES   COPD (chronic obstructive pulmonary disease) (HCC)    Coronary artery disease    Dizziness    H/O: rheumatic fever    Heart murmur    Hypertension    PONV (postoperative nausea and vomiting)    ' SOMETIMES', BUT NOT ALWAYS   Primary hypertension    RHEUMATIC MITRAL STENOSIS 01/05/2008   Qualifier: Diagnosis of   By: Neysa MD, Clinton D        S/P Maze operation for atrial fibrillation 10/14/2016   Complete bilateral atrial lesion set using cryothermy and bipolar radiofrequency ablation - atrial appendage was not treated due to previous surgical procedure (open mitral commissurotomy)   S/P mitral valve replacement with bioprosthetic valve 10/14/2016   29 mm Medtronic Mosaic porcine bioprosthetic tissue valve   Sciatica 08/29/2023   Tricuspid regurgitation 07/14/2021   UTI (urinary tract infection)    Valvular heart disease    Has mitral stenosis with prior mitral commissurotomy in 1970   Past Surgical History:  Procedure Laterality Date   ABDOMINAL HYSTERECTOMY  1983   endometriosis   APPENDECTOMY     BACK SURGERY     neurosurgery x2   CARDIAC CATHETERIZATION     CARDIAC CATHETERIZATION N/A 08/03/2016   Procedure: Right/Left Heart Cath and Coronary Angiography;  Surgeon: Peter M Swaziland, MD;  Location: [facility] INVASIVE CV LAB;  Service: Cardiovascular;  Laterality: N/A;   cataract surg     CHOLECYSTECTOMY N/A 05/30/2015   Procedure: LAPAROSCOPIC CHOLECYSTECTOMY WITH INTRAOPERATIVE CHOLANGIOGRAM;  Surgeon: Morene Olives, MD;  Location: [facility] OR;  Service: General;  Laterality: N/A;   COLONOSCOPY     CORONARY ARTERY BYPASS GRAFT N/A 10/14/2016   Procedure: CORONARY ARTERY BYPASS GRAFTING (CABG);  Surgeon: Sudie VEAR Laine, MD;  Location: [facility] OR;  Service: Open Heart Surgery;  Laterality: N/A;   EYE  SURGERY     MAZE N/A 10/14/2016   Procedure: MAZE;  Surgeon: Sudie VEAR Laine, MD;  Location: [facility] OR;  Service: Open Heart Surgery;  Laterality: N/A;   MITRAL VALVE REPLACEMENT N/A 10/14/2016   Procedure: REDO MITRAL VALVE REPLACEMENT (MVR);  Surgeon: Sudie VEAR Laine, MD;  Location: [facility] OR;  Service: Open Heart Surgery;  Laterality: N/A;   MITRAL VALVE SURGERY Left 1970   Open mitral commissurotomy via left thoracotomy approach   PACEMAKER IMPLANT N/A 09/22/2023   Procedure: PACEMAKER IMPLANT;  Surgeon: Kennyth Chew, MD;  Location: [facility] INVASIVE CV LAB;  Service: Cardiovascular;  Laterality: N/A;   TEE WITHOUT CARDIOVERSION N/A 07/05/2016   Procedure: TRANSESOPHAGEAL ECHOCARDIOGRAM (TEE);  Surgeon: Annabella Scarce, MD;  Location: [facility] ENDOSCOPY;  Service: Cardiovascular;  Laterality: N/A;   TEE WITHOUT CARDIOVERSION N/A 10/14/2016   Procedure: TRANSESOPHAGEAL ECHOCARDIOGRAM (TEE);  Surgeon: Sudie VEAR Laine, MD;  Location: [facility] OR;  Service: Open Heart Surgery;  Laterality: N/A;   Social History:  reports that she quit smoking about 48 years ago. Her smoking use included cigarettes. She started smoking about 63 years ago. She has a 15 pack-year smoking history. She has never used smokeless tobacco. She reports that she does not drink alcohol and does not use drugs.  Allergies  Allergen Reactions   Aldactone  [Spironolactone ] Other (See Comments)    Dyspnea    Amoxil  [Amoxicillin ] Palpitations and Other (See Comments)    Tachycardia   Zebeta  [Bisoprolol ] Shortness Of Breath   Calan  [Verapamil ] Other (See Comments)    Myalgias    Cardioquin [Quinidine] Diarrhea   Cardizem [Diltiazem] Other (See Comments)    Headaches     Crestor  [Rosuvastatin ] Other (See Comments)    Myalgias    Flagyl  [Metronidazole  Hcl] Other (See Comments)    Causing headaches     Flovent  [Fluticasone  Propionate] Other (See Comments)    Leg cramps   Livalo  [Pitavastatin ] Other (See Comments)    Myalgias    Lyrica  [Pregabalin] Swelling   Neurontin [Gabapentin] Swelling   Norvasc  [Amlodipine ] Swelling    Low extremity edema   Ultram  [Tramadol ] Nausea Only   Zetia  [Ezetimibe ] Other (See Comments)    Myalgias   Zocor  [Simvastatin ] Other (See Comments)    Myalgias    Cipro  [Ciprofloxacin  Hcl] Diarrhea   Lotensin [Benazepril] Cough   Macrobid [Nitrofurantoin] Nausea Only   Codeine Nausea Only   Polytrim  [Polymyxin B -Trimethoprim ] Swelling    Family History  Problem Relation Age of Onset   Leukemia Father    Breast cancer Neg Hx     Prior to Admission medications   Medication Sig Start Date End Date Taking? Authorizing Provider  acetaminophen  (TYLENOL ) 500 MG tablet Take 500 mg by mouth 2 (two) times daily.    [provider]  albuterol  (PROVENTIL ) (2.5 MG/3ML) 0.083% nebulizer solution Take 3 mLs (2.5 mg total) by nebulization every 6 (six) hours as needed (during COPD Exacerbation). 02/15/24 02/14/25  Hope Almarie ORN, NP  albuterol  (  VENTOLIN  HFA) 108 (90 Base) MCG/ACT inhaler INHALE 2 PUFFS INTO THE LUNGS EVERY 4 HOURS AS NEEDED FOR AHEEZING OR SHORTNESS OF BREATH 02/21/24   Parrett, Tammy S, NP  apixaban  (ELIQUIS ) 2.5 MG TABS tablet TAKE ONE TABLET TWICE DAILY (START 09/18/23 OR AS DIRECTED) 10/24/23   Burchette, Wolm ORN, MD  bumetanide  (BUMEX ) 2 MG tablet Take 1 tablet (2 mg total) by mouth daily. 11/08/23   Lesia Ozell Barter, PA-C  cholecalciferol (VITAMIN D3) 25 MCG (1000 UNIT) tablet Take 1,000 Units by mouth daily.    [provider]  clindamycin  (CLEOCIN ) 300 MG capsule Take two capsules one hour prior to dental procedure. 11/22/23   Burchette, Wolm ORN, MD  diazepam  (VALIUM ) 5 MG tablet TAKE ONE TABLET BY MOUTH AT BEDTIME 12/12/23   Burchette, Wolm ORN, MD  estradiol  (ESTRACE ) 0.1 MG/GM vaginal cream Place 1 Applicatorful vaginally 3 (three) times a week.    [provider]  fluconazole  (DIFLUCAN ) 150 MG tablet Take one tablet times one dose as needed for yeast  vaginitis. 11/22/23   Burchette, Wolm ORN, MD  fluticasone  (FLONASE ) 50 MCG/ACT nasal spray Place 1 spray into both nostrils daily as needed for allergies or rhinitis. 08/30/22   Sood, Vineet, MD  HYDROcodone -acetaminophen  (NORCO/VICODIN) 5-325 MG tablet TAKE ONE TABLET EVERY 8 HOURS AS NEEDED FOR PAIN 02/13/24   Burchette, Wolm ORN, MD  metolazone  (ZAROXOLYN ) 5 MG tablet Take 1 tablet (5 mg total) by mouth as directed. TAKE ONE TABLET AS NEEDED ONCE A WEEK 30 MINUTES BEFORE BUMEX  DOSE. AS NEEDED ONLY FOR WEIGHT GAIN 2LBS OVERNIGHT OR 5 LBS IN ONE WEEK 01/03/24   Walker, Caitlin S, NP  Multiple Vitamins-Minerals (HAIR/SKIN/NAILS/BIOTIN PO) Take 1 tablet by mouth daily.    [provider]  Multiple Vitamins-Minerals (MULTIVITAMIN ADULT EXTRA C) CHEW Chew 2 tablets by mouth daily.    [provider]  mupirocin  ointment (BACTROBAN ) 2 % APPLY TOPICALLY ONCE A DAY 03/16/24   Burchette, Wolm ORN, MD  potassium chloride  (KLOR-CON ) 10 MEQ tablet Take 10 mEq by mouth as directed. TAKE 4 TABLETS TWICE A DAY    [provider]  sacubitril -valsartan  (ENTRESTO ) 24-26 MG Take 1 tablet by mouth 2 (two) times daily. 07/27/23   Raford Riggs, MD    Physical Exam: Vitals:   03/25/24 0518 03/25/24 0639 03/25/24 0913 03/25/24 1139  BP:  104/61 (!) 110/54 (!) 101/47  Pulse:  86 68 60  Resp:  16    Temp: 97.8 F (36.6 C) 97.7 F (36.5 C) 97.9 F (36.6 C) 98.2 F (36.8 C)  TempSrc: Oral Oral Oral Oral  SpO2:  98% 100% 100%  Weight:  55.3 kg     General: Alert, oriented x3, resting comfortably in no acute distress Respiratory: Bibasilar rales; no wheezing Cardiovascular: Regular rate and rhythm w/o m/r/g   Data Reviewed:  Lab Results  Component Value Date   WBC 8.0 03/24/2024   HGB 11.9 (L) 03/24/2024   HCT 36.3 03/24/2024   MCV 92.4 03/24/2024   PLT 166 03/24/2024   Lab Results  Component Value Date   GLUCOSE 105 (H) 03/24/2024   CALCIUM  9.3 03/24/2024   NA 140 03/24/2024    K 5.4 (H) 03/24/2024   CO2 24 03/24/2024   CL 106 03/24/2024   BUN 26 (H) 03/24/2024   CREATININE 1.19 (H) 03/24/2024   Lab Results  Component Value Date   ALT 11 09/13/2023   AST 22 09/13/2023   ALKPHOS 53 09/13/2023   BILITOT  0.4 09/13/2023   Lab Results  Component Value Date   INR 2.1 (H) 09/13/2023   INR 1.8 (H) 09/12/2023   INR 1.5 (H) 09/10/2023    Radiology: [facility] Chest Port 1 View Result Date: 03/24/2024 CLINICAL DATA:  Congestion and shortness of breath. EXAM: PORTABLE CHEST 1 VIEW COMPARISON:  October 03, 2023 FINDINGS: There is stable dual lead AICD positioning. Multiple sternal wires are noted. The cardiac silhouette is mildly enlarged and unchanged in size. Mild diffusely increased interstitial lung markings are seen which may be, in part, chronic in nature. Mild to moderate severity areas of atelectasis and/or infiltrate are noted within the bilateral lung bases. There is a small bilateral pleural effusions, right greater than left. No pneumothorax is identified. Multilevel degenerative changes are seen throughout the thoracic spine. IMPRESSION: 1. Stable cardiomegaly with mild to moderate severity bibasilar atelectasis and/or infiltrate. 2. Small bilateral pleural effusions. Electronically Signed   By: Suzen Dials M.D.   On: 03/24/2024 19:17    Assessment and Plan: 75F h/o HFpEF, Afib, s/p MV replacement, HTN, HLD, and COPD p/w SOB and CAP c/b ADHF on admission XR.  CAP -IV CTX 1g daily x 4 days to complete 5 day CAP course -PO azithromycin  500mg  daily x2 doses to complete 3 day CAP course -Albuterol  nebs prn -Wean O2 as tolerated -Ambulatory pulse ox prior to d/c  HFpEF EF 65-70% in 10/2023 -RD consulted for low sodium diet teaching -IV lasix  40mg  BID for now; goal net neg 1-2L/d; strict I/Os; daily standing weights; K>4/Mg>2 -HOLD pta Entresto  -Consider cardiology consult prior to discharge to optimize home diuretic dosing as pt with complicated regimen  (bumex  1x daily vs bumex  daily followed by bumex  BID every other day vs bumex  daily and metolazone  once a week)   H/o Afib -PTA aixaban 2.5mg  BID  H/o COPD Low suspicion for AECOPD; will defer steroids for now -Albuterol  nebs prn per above   Advance Care Planning:   Code Status: Limited: Do not attempt resuscitation (DNR) -DNR-LIMITED -Do Not Intubate/DNI    Consults: N/A  Family Communication: N/A  Severity of Illness: The appropriate patient status for this patient is INPATIENT. Inpatient status is judged to be reasonable and necessary in order to provide the required intensity of service to ensure the patient's safety. The patient's presenting symptoms, physical exam findings, and initial radiographic and laboratory data in the context of their chronic comorbidities is felt to place them at high risk for further clinical deterioration. Furthermore, it is not anticipated that the patient will be medically stable for discharge from the hospital within 2 midnights of admission.   * I certify that at the point of admission it is my clinical judgment that the patient will require inpatient hospital care spanning beyond 2 midnights from the point of admission due to high intensity of service, high risk for further deterioration and high frequency of surveillance required.*   ------- I spent 55 minutes reviewing previous labs/notes, obtaining separate history at the bedside, counseling/discussing the treatment plan outlined above, ordering medications/tests, and performing clinical documentation.  Author: Marsha Ada, MD 03/25/2024 1:13 PM  For on call review [email or web address].

## 2024-03-26 DIAGNOSIS — I482 Chronic atrial fibrillation, unspecified: Secondary | ICD-10-CM

## 2024-03-26 DIAGNOSIS — E875 Hyperkalemia: Secondary | ICD-10-CM | POA: Diagnosis not present

## 2024-03-26 DIAGNOSIS — N179 Acute kidney failure, unspecified: Secondary | ICD-10-CM | POA: Diagnosis not present

## 2024-03-26 DIAGNOSIS — R600 Localized edema: Secondary | ICD-10-CM

## 2024-03-26 DIAGNOSIS — J189 Pneumonia, unspecified organism: Secondary | ICD-10-CM

## 2024-03-26 DIAGNOSIS — I5033 Acute on chronic diastolic (congestive) heart failure: Secondary | ICD-10-CM | POA: Diagnosis not present

## 2024-03-26 LAB — BASIC METABOLIC PANEL WITH GFR
Anion gap: 8 (ref 5–15)
BUN: 20 mg/dL (ref 8–23)
CO2: 25 mmol/L (ref 22–32)
Calcium: 8.6 mg/dL — ABNORMAL LOW (ref 8.9–10.3)
Chloride: 107 mmol/L (ref 98–111)
Creatinine, Ser: 0.92 mg/dL (ref 0.44–1.00)
GFR, Estimated: >60 mL/min (ref >60)
Glucose, Bld: 92 mg/dL (ref 70–99)
Potassium: 3.6 mmol/L (ref 3.5–5.1)
Sodium: 140 mmol/L (ref 135–145)

## 2024-03-26 MED ORDER — METOLAZONE 5 MG PO TABS
10.0000 mg | ORAL_TABLET | Freq: Once | ORAL | Status: AC
  Administered 2024-03-26: 10 mg via ORAL
  Filled 2024-03-26: qty 2

## 2024-03-26 MED ORDER — ONDANSETRON 4 MG PO TBDP
4.0000 mg | ORAL_TABLET | Freq: Three times a day (TID) | ORAL | Status: DC | PRN
  Administered 2024-03-26: 4 mg via ORAL
  Filled 2024-03-26 (×2): qty 1

## 2024-03-26 MED ORDER — DIAZEPAM 2 MG PO TABS
2.0000 mg | ORAL_TABLET | ORAL | Status: AC
  Administered 2024-03-26: 2 mg via ORAL
  Filled 2024-03-26: qty 1

## 2024-03-26 MED ORDER — BUMETANIDE 0.25 MG/ML IJ SOLN
2.0000 mg | Freq: Three times a day (TID) | INTRAMUSCULAR | Status: DC
  Administered 2024-03-26 – 2024-03-27 (×4): 2 mg via INTRAVENOUS
  Filled 2024-03-26 (×6): qty 8

## 2024-03-26 MED ORDER — ENSURE PLUS HIGH PROTEIN PO LIQD
237.0000 mL | Freq: Two times a day (BID) | ORAL | Status: DC
  Administered 2024-03-27: 237 mL via ORAL

## 2024-03-26 NOTE — Hospital Course (Signed)
 89 y.o. female with medical history significant of paroxysmal A-fib on Coumadin , severe mitral stenosis status post bioprosthetic mitral valve replacement, coronary artery disease status post single-vessel CABG, hypertension, hyperlipidemia with statin and Zetia  intolerance, chronic HFpEF (EF 65-70% in 10/2023), chronic back pain with prior back surgeries, chronic insomnia, and CKD 3A p/w SOB iso CAP c/b ADHF on admission XR.    Assessment and Plan:   Acute exacerbation of chronic HFpEF - Noted dyspnea on exertion, lower extremity edema worsening dyspnea on presentation.  Worsening swelling prior to admission per cardiology note.  Mild response to diuresis so far.  Will increase IV diuresis to 2 mg IV Bumex  every 8 hours with 10 mg metolazone  x 1.  Monitor I's and O's.  Recheck kidney function in AM.   Possible community-acquired pneumonia - Chest x-ray noting bilateral consolidations with concern for pneumonia on presentation.  He was initiated on empiric ceftriaxone  plus azithromycin .  Chest x-ray personally reviewed more likely consistent with edema.  Influenza/COVID/RSV rapid swabs negative.  Will continue with 3 days of empiric antibiotic therapy.   Atrial fibrillation - Continue Eliquis  2.5 mg twice daily.  Continues on telemetry.   COPD - Does not appear to be in acute exacerbation.  No wheezing or cough appreciated.  Nebulizers as needed.   Physical debilitation muscle weakness - Likely exacerbated by above.  Advanced age with underlying volume overload in the setting of CHF exacerbation.  Will order PT consult.   Acute kidney injury - Likely prerenal etiology.  Creatinine 1.19 on presentation (previous baseline 0.7).  Showing improvement this morning.  Continue to monitor urine output and recheck kidney function in AM.   Mild hyperkalemia - Potassium 5.4 on presentation.  Showing improvement after diuresis.  Will continue to monitor.  Will recheck BMP and magnesium  in AM.

## 2024-03-26 NOTE — Progress Notes (Addendum)
 Progress Note   Patient: Catherine [name] FMW:[id number] DOB: Jan 25, 1935 DOA: 03/24/2024  DOS: the patient was seen and examined on 03/26/2024   Brief hospital course:  89 y.o. female with medical history significant of paroxysmal A-fib on Coumadin , severe mitral stenosis status post bioprosthetic mitral valve replacement, coronary artery disease status post single-vessel CABG, hypertension, hyperlipidemia with statin and Zetia  intolerance, chronic HFpEF (EF 65-70% in 10/2023), chronic back pain with prior back surgeries, chronic insomnia, and CKD 3A p/w SOB iso CAP c/b ADHF on admission XR.   Assessment and Plan:  Acute exacerbation of chronic HFpEF - Noted dyspnea on exertion, lower extremity edema worsening dyspnea on presentation.  Worsening swelling prior to admission per cardiology note.  Mild response to diuresis so far.  Will increase IV diuresis to 2 mg IV Bumex  every 8 hours with 10 mg metolazone  x 1.  Monitor I's and O's.  Recheck kidney function in AM.  Possible community-acquired pneumonia - Chest x-ray noting bilateral consolidations with concern for pneumonia on presentation.  He was initiated on empiric ceftriaxone  plus azithromycin .  Chest x-ray personally reviewed more likely consistent with edema.  Influenza/COVID/RSV rapid swabs negative.  Will continue with 3 days of empiric antibiotic therapy.  Atrial fibrillation - Continue Eliquis  2.5 mg twice daily.  Continues on telemetry.  COPD - Does not appear to be in acute exacerbation.  No wheezing or cough appreciated.  Nebulizers as needed.  Physical debilitation muscle weakness - Likely exacerbated by above.  Advanced age with underlying volume overload in the setting of CHF exacerbation.  Will order PT consult.  Acute kidney injury - Likely prerenal etiology.  Creatinine 1.19 on presentation (previous baseline 0.7).  Showing improvement this morning.  Continue to monitor urine output and recheck kidney function in  AM.  Mild hyperkalemia - Potassium 5.4 on presentation.  Showing improvement after diuresis.  Will continue to monitor.  Will recheck BMP and magnesium  in AM.  Subjective: Patient feeling minimally improved this morning.  Sitting up in chair.  Admits to orthopnea.  States her lower extremity swelling is improved but still has some residual edema.  Denies any chest pain, purulent sputum, fever, nausea, vomiting, abdominal pain.  Physical Exam:  Vitals:   03/26/24 0200 03/26/24 0400 03/26/24 0733 03/26/24 1200  BP:  (!) 91/48 (!) 104/52   Pulse: 65 63 61 75  Resp:  17 17 16   Temp:  98.4 F (36.9 C) 97.6 F (36.4 C) 98.1 F (36.7 C)  TempSrc:  Axillary Oral Oral  SpO2: 93% (!) 89% 96% 96%  Weight:        GENERAL:  Alert, pleasant, no acute distress  HEENT:  EOMI CARDIOVASCULAR:  RRR, no murmurs appreciated RESPIRATORY: Poor air movement bilaterally GASTROINTESTINAL:  Soft, nontender, nondistended EXTREMITIES: 2+ R>L BL LE pitting edema NEURO:  No new focal deficits appreciated SKIN: Scattered ecchymosis PSYCH:  Appropriate mood and affect     Data Reviewed:  No new imaging to review  Previous records (including but not limited to H&P, progress notes, nursing notes, TOC management) were reviewed in assessment of this patient.  Labs: CBC: Recent Labs  Lab 03/24/24 1934  WBC 8.0  HGB 11.9*  HCT 36.3  MCV 92.4  PLT 166   Basic Metabolic Panel: Recent Labs  Lab 03/24/24 1934 03/25/24 1246 03/26/24 0451  NA 140 141 140  K 5.4* 4.6 3.6  CL 106 103 107  CO2 24 22 25   GLUCOSE 105* 109* 92  BUN 26* 23  20  CREATININE 1.19* 1.02* 0.92  CALCIUM  9.3 8.8* 8.6*   Liver Function Tests: Recent Labs  Lab 03/25/24 1246  AST 31  ALT 11  ALKPHOS 80  BILITOT 1.1  PROT 4.9*  ALBUMIN  2.3*   CBG: No results for input(s): GLUCAP in the last 168 hours.  Scheduled Meds:  apixaban   2.5 mg Oral BID   azithromycin   500 mg Oral Daily   bumetanide  (BUMEX ) IV  2 mg  Intravenous Q8H   guaiFENesin   600 mg Oral BID   Continuous Infusions:  cefTRIAXone  (ROCEPHIN )  IV 1 g (03/26/24 0052)   PRN Meds:.acetaminophen , albuterol , HYDROcodone -acetaminophen   Family Communication: None at bedside  Disposition: Status is: Inpatient Remains inpatient appropriate because: CHF exacerbation     Time spent: 48 minutes  Length of inpatient stay: 1 days  Author: Carliss LELON Canales, DO 03/26/2024 1:36 PM  For on call review [email or web address].

## 2024-03-26 NOTE — Plan of Care (Signed)

## 2024-03-26 NOTE — Progress Notes (Signed)
 Mobility Specialist Progress Note;   03/26/24 1052  Mobility  Activity Ambulated with assistance in hallway  Level of Assistance Contact guard assist, steadying assist  Assistive Device None  Distance Ambulated (ft) 350 ft  Activity Response Tolerated well  Mobility Referral Yes  Mobility visit 1 Mobility  Mobility Specialist Start Time (ACUTE ONLY) 1052  Mobility Specialist Stop Time (ACUTE ONLY) 1102  Mobility Specialist Time Calculation (min) (ACUTE ONLY) 10 min   Pt eager for mobility. Required MinG assistance during ambulation for safety. 1x minor LOB noted, assistance to recover. VSS throughout and no c/o when asked. Pt returned to chair with all needs met, call bell in reach.   Lauraine Erm Mobility Specialist Please contact via SecureChat or [employer] [phone]

## 2024-03-26 NOTE — Plan of Care (Signed)
 Nutrition Education Note  RD consulted for nutrition education regarding acute on chronic CHF and low sodium diet.  RD provided Low Sodium Nutrition Therapy and Heart Failure Nutrition Therapy handout from the Academy of [employer]. Reviewed patient's dietary recall. Pt reports poor appetite most days and lack of protein intake, discussed easy protein options that require minimal preparation. Pt reports relying on take out foods when energy levels are low, discussed nutrient dense, low sodium snacks like cottage cheese fruit bowls, and greek yogurt egg salad, pt seemed interested in agreed those would be simple enough. Provided examples on ways to decrease sodium intake in diet like rinsing canned vegetables, decreasing take out meals, and looking for items with 140mg  sodium per serving. Discouraged intake of processed foods and use of salt shaker. Encouraged fresh fruits and vegetables as well as whole grain sources of carbohydrates to maximize fiber intake. Discussed ONS like Ensure to help pt meet protein needs when she does not have an appetite or energy to cook.  RD discussed why it is important for patient to adhere to diet recommendations, and emphasized the role of fluids, foods to avoid, and importance of weighing self daily. Provided coupons for Ensure to help pt meet protein needs once discharged. Attached handouts to AVS. Teach back method used.  Expect good compliance.  Body mass index is 21.6 kg/m. Pt meets criteria for normal based on current BMI.  Current diet order is 2g Sodium, patient is consuming approximately 100% of meals at this time. Labs and medications reviewed. No further nutrition interventions warranted at this time. RD contact information provided. If additional nutrition issues arise, please re-consult RD.   Josette Glance, MS, RDN, LDN Clinical Dietitian I Please reach out via secure chat

## 2024-03-26 NOTE — Discharge Instructions (Signed)
 Low Sodium Nutrition Therapy  Eating less sodium can help you if you have high blood pressure, heart failure, or kidney or liver disease.   Your body needs a little sodium, but too much sodium can cause your body to hold onto extra water . This extra water  will raise your blood pressure and can cause damage to your heart, kidneys, or liver as they are forced to work harder.   Sometimes you can see how the extra fluid affects you because your hands, legs, or belly swell. You may also hold water  around your heart and lungs, which makes it hard to breathe.   Even if you take medication for blood pressure or a water  pill (diuretic) to remove fluid, it is still important to have less salt in your diet.   Check with your primary care provider before drinking alcohol since it may affect the amount of fluid in your body and how your heart, kidneys, or liver work. Sodium in Food A low-sodium meal plan limits the sodium that you get from food and beverages to 1,500-2,000 milligrams (mg) per day. Salt is the main source of sodium. Read the nutrition label on the package to find out how much sodium is in one serving of a food.  Select foods with 140 milligrams (mg) of sodium or less per serving.  You may be able to eat one or two servings of foods with a little more than 140 milligrams (mg) of sodium if you are closely watching how much sodium you eat in a day.  Check the serving size on the label. The amount of sodium listed on the label shows the amount in one serving of the food. So, if you eat more than one serving, you will get more sodium than the amount listed.  Tips Cutting Back on Sodium Eat more fresh foods.  Fresh fruits and vegetables are low in sodium, as well as frozen vegetables and fruits that have no added juices or sauces.  Fresh meats are lower in sodium than processed meats, such as bacon, sausage, and hotdogs.  Not all processed foods are unhealthy, but some processed foods may  have too much sodium.  Eat less salt at the table and when cooking. One of the ingredients in salt is sodium.  One teaspoon of table salt has 2,300 milligrams of sodium.  Leave the salt out of recipes for pasta, casseroles, and soups. Be a Engineer, building services.  Food packages that say "Salt-free", sodium-free", "very low sodium," and "low sodium" have less than 140 milligrams of sodium per serving.  Beware of products identified as "Unsalted," "No Salt Added," "Reduced Sodium," or "Lower Sodium." These items may still be high in sodium. You should always check the nutrition label. Add flavors to your food without adding sodium.  Try lemon juice, lime juice, or vinegar.  Dry or fresh herbs add flavor.  Buy a sodium-free seasoning blend or make your own at home. You can purchase salt-free or sodium-free condiments like barbeque sauce in stores and online. Ask your registered dietitian nutritionist for recommendations and where to find them.   Eating in Restaurants Choose foods carefully when you eat outside your home. Restaurant foods can be very high in sodium. Many restaurants provide nutrition facts on their menus or their websites. If you cannot find that information, ask your server. Let your server know that you want your food to be cooked without salt and that you would like your salad dressing and sauces to be served  on the side.    Foods Recommended Food Group Foods Recommended  Grains Bread, bagels, rolls without salted tops Homemade bread made with reduced-sodium baking powder Cold cereals, especially shredded wheat and puffed rice Oats, grits, or cream of wheat Pastas, quinoa, and rice Popcorn, pretzels or crackers without salt Corn tortillas  Protein Foods Fresh meats and fish; [place] bacon (check the nutrition labels - make sure they are not packaged in a sodium solution) Canned or packed tuna (no more than 4 ounces at 1 serving) Beans and peas Soybeans) and tofu Eggs Nuts or nut  butters without salt  Dairy Milk or milk powder Plant milks, such as rice and soy Yogurt, including Greek yogurt Small amounts of natural cheese (blocks of cheese) or reduced-sodium cheese can be used in moderation. (Swiss, ricotta, and fresh mozzarella cheese are lower in sodium than the others) Cream Cheese Low sodium cottage cheese  Vegetables Fresh and frozen vegetables without added sauces or salt Homemade soups (without salt) Low-sodium, salt-free or sodium-free canned vegetables and soups  Fruit Fresh and canned fruits Dried fruits, such as raisins, cranberries, and prunes  Oils Tub or liquid margarine, regular or without salt Canola, corn, peanut, olive, safflower, or sunflower oils  Condiments Fresh or dried herbs such as basil, bay leaf, dill, mustard (dry), nutmeg, paprika, parsley, rosemary, sage, or thyme.  Low sodium ketchup Vinegar  Lemon or lime juice Pepper, red pepper flakes, and cayenne. Hot sauce contains sodium, but if you use just a drop or two, it will not add up to much.  Salt-free or sodium-free seasoning mixes and marinades Simple salad dressings: vinegar and oil   Foods Not Recommended Food Group Foods Not Recommended  Grains Breads or crackers topped with salt Cereals (hot/cold) with more than 300 mg sodium per serving Biscuits, cornbread, and other "quick" breads prepared with baking soda Pre-packaged bread crumbs Seasoned and packaged rice and pasta mixes Self-rising flours  Protein Foods Cured meats: Bacon, ham, sausage, pepperoni and hot dogs Canned meats (chili, vienna sausage, or sardines) Smoked fish and meats Frozen meals that have more than 600 mg of sodium per serving Egg substitute (with added sodium)  Dairy Buttermilk Processed cheese spreads Cottage cheese (1 cup may have over 500 mg of sodium; look for low-sodium.) American or feta cheese Shredded Cheese has more sodium than blocks of cheese String cheese  Vegetables Canned  vegetables (unless they are salt-free, sodium-free or low sodium) Frozen vegetables with seasoning and sauces Sauerkraut and pickled vegetables Canned or dried soups (unless they are salt-free, sodium-free, or low sodium) [place] fries and onion rings  Fruit Dried fruits preserved with additives that have sodium  Oils Salted butter or margarine, all types of olives  Condiments Salt, sea salt, kosher salt, onion salt, and garlic salt Seasoning mixes with salt Bouillon cubes Ketchup Barbeque sauce and Worcestershire sauce unless low sodium Soy sauce Salsa, pickles, olives, relish Salad dressings: ranch, blue cheese, [place], and [place].   Low Sodium Sample 1-Day Menu  Breakfast 1 cup cooked oatmeal  1 slice whole wheat bread toast  1 tablespoon peanut butter without salt  1 banana  1 cup 1% milk  Lunch Tacos made with: 2 corn tortillas   cup black beans, low sodium   cup roasted or grilled chicken (without skin)   avocado  Squeeze of lime juice  1 cup salad greens  1 tablespoon low-sodium salad dressing   cup strawberries  1 orange  Afternoon Snack 1/3 cup grapes  6  ounces yogurt  Evening Meal 3 ounces herb-baked fish  1 baked potato  2 teaspoons olive oil   cup cooked carrots  2 thick slices tomatoes on:  2 lettuce leaves  1 teaspoon olive oil  1 teaspoon balsamic vinegar  1 cup 1% milk  Evening Snack 1 apple   cup almonds without salt   Low-Sodium Vegetarian (Lacto-Ovo) Sample 1-Day Menu  Breakfast 1 cup cooked oatmeal  1 slice whole wheat toast  1 tablespoon peanut butter without salt  1 banana  1 cup 1% milk  Lunch Tacos made with: 2 corn tortillas   cup black beans, low sodium   cup roasted or grilled chicken (without skin)   avocado  Squeeze of lime juice  1 cup salad greens  1 tablespoon low-sodium salad dressing   cup strawberries  1 orange  Evening Meal Stir fry made with:  cup tofu  1 cup brown rice   cup broccoli   cup green beans    cup peppers   tablespoon peanut oil  1 orange  1 cup 1% milk  Evening Snack 4 strips celery  2 tablespoons hummus  1 hard-boiled egg   Low-Sodium Vegan Sample 1-Day Menu  Breakfast 1 cup cooked oatmeal  1 tablespoon peanut butter without salt  1 cup blueberries  1 cup soymilk fortified with calcium , vitamin B12, and vitamin D   Lunch 1 small whole wheat pita   cup cooked lentils  2 tablespoons hummus  4 carrot sticks  1 medium apple  1 cup soymilk fortified with calcium , vitamin B12, and vitamin D   Evening Meal Stir fry made with:  cup tofu  1 cup brown rice   cup broccoli   cup green beans   cup peppers   tablespoon peanut oil  1 cup cantaloupe  Evening Snack 1 cup soy yogurt   cup mixed nuts  Copyright 2020  Academy [employer]. All rights reserved  Sodium Free Flavoring Tips  When cooking, the following items may be used for flavoring instead of salt or seasonings that contain sodium. Remember: A little bit of spice goes a long way! Be careful not to overseason. Spice Blend Recipe (makes about ? cup) 5 teaspoons onion powder  2 teaspoons garlic powder  2 teaspoons paprika  2 teaspoon dry mustard  1 teaspoon crushed thyme leaves   teaspoon white pepper   teaspoon celery seed Food Item Flavorings  Beef Basil, bay leaf, caraway, curry, dill, dry mustard, garlic, grape jelly, green pepper, mace, marjoram, mushrooms (fresh), nutmeg, onion or onion powder, parsley, pepper, rosemary, sage  Chicken Basil, cloves, cranberries, mace, mushrooms (fresh), nutmeg, oregano, paprika, parsley, pineapple, saffron, sage, savory, tarragon, thyme, tomato, turmeric  Egg Chervil, curry, dill, dry mustard, garlic or garlic powder, green pepper, jelly, mushrooms (fresh), nutmeg, onion powder, paprika, parsley, rosemary, tarragon, tomato  Fish Basil, bay leaf, chervil, curry, dill, dry mustard, green pepper, lemon juice, marjoram, mushrooms (fresh), paprika,  pepper, tarragon, tomato, turmeric  Lamb Cloves, curry, dill, garlic or garlic powder, mace, mint, mint jelly, onion, oregano, parsley, pineapple, rosemary, tarragon, thyme  Pork Applesauce, basil, caraway, chives, cloves, garlic or garlic powder, onion or onion powder, rosemary, thyme  Veal Apricots, basil, bay leaf, currant jelly, curry, ginger, marjoram, mushrooms (fresh), oregano, paprika  Vegetables Basil, dill, garlic or garlic powder, ginger, lemon juice, mace, marjoram, nutmeg, onion or onion powder, tarragon, tomato, sugar or sugar substitute, salt-free salad dressing, vinegar  Desserts Allspice, anise, cinnamon, cloves, ginger, mace, nutmeg,  vanilla extract, other extracts   Copyright 2020  Academy of [employer]etics. All rights reserved  Fluid Restricted Nutrition Therapy  You have been prescribed this diet because your condition affects how much fluid you can eat or drink. If your heart, liver, or kidneys aren't working properly, you may not be able to effectively eliminate fluids from the body and this may cause swelling (edema) in the legs, arms, and/or stomach. Drink no more than _________ liters or ________ ounces or ________cups of fluid per day.  You don't need to stop eating or drinking the same fluids you normally would, but you may need to eat or drink less than usual.  Your registered dietitian nutritionist will help you determine the correct amount of fluid to consume during the day Breakfast Include fluids taken with medications  Lunch Include fluids taken with medications  Dinner Include fluids taken with medications  Bedtime Snack Include fluids taken with medications     Tips What Are Fluids?  A fluid is anything that is liquid or anything that would melt if left at room temperature. You will need to count these foods and liquids--including any liquid used to take medication--as part of your daily fluid intake. Some examples are: Alcohol (drink only with  your doctor's permission)  Coffee, tea, and other hot beverages  Gelatin (Jell-O)  Gravy  Ice cream, sherbet, sorbet  Ice cubes, ice chips  Milk, liquid creamer  Nutritional supplements  Popsicles  Vegetable and fruit juices; fluid in canned fruit  Watermelon  Yogurt  Soft drinks, lemonade, limeade  Soups  Syrup How Do I Measure My Fluid Intake? Record your fluid intake daily.  Tip: Every day, each time you eat or drink fluids, pour water  in the same amount into an empty container that can hold the same amount of fluids you are allowed daily. This may help you keep track of how much fluid you are taking in throughout the day.  To accurately keep track of how much liquid you take in, measure the size of the cups, glasses, and bowls you use. If you eat soup, measure how much of it is liquid and how much is solid (such as noodles, vegetables, meat). Conversions for Measuring Fluid Intake  Milliliters (mL) Liters (L) Ounces (oz) Cups (c)  1000 1 32 4  1200 1.2 40 5  1500 1.5 50 6 1/4  1800 1.8 60 7 1/2  2000 2 67 8 1/3  Tips to Reduce Your Thirst Chew gum or suck on hard candy.  Rinse or gargle with mouthwash. Do not swallow.  Ice chips or popsicles my help quench thirst, but this too needs to be calculated into the total restriction. Melt ice chips or cubes first to figure out how much fluid they produce (for example, experiment with melting  cup ice chips or 2 ice cubes).  Add a lemon wedge to your water .  Limit how much salt you take in. A high salt intake might make you thirstier.  Don't eat or drink all your allowed liquids at once. Space your liquids out through the day.  Use small glasses and cups and sip slowly. If allowed, take your medications with fluids you eat or drink during a meal.   Fluid-Restricted Nutrition Therapy Sample 1-Day Menu  Breakfast 1 slice wheat toast  1 tablespoon peanut butter  1/2 cup yogurt (120 milliliters)  1/2 cup blueberries  1 cup milk (240  milliliters)   Lunch 3 ounces sliced [place]  2  slices whole wheat bread  1/2 cup lettuce for sandwich  2 slices tomato for sandwich  1 ounce reduced-fat, reduced-sodium cheese  1/2 cup fresh carrot sticks  1 banana  1 cup unsweetened tea (240 milliliters)   Evening Meal 8 ounces soup (240 milliliters)  3 ounces salmon  1/2 cup quinoa  1 cup green beans  1 cup mixed greens salad  1 tablespoon olive oil  1 cup coffee (240 milliliters)  Evening Snack 1/2 cup sliced peaches  1/2 cup frozen yogurt (120 milliliters)  1 cup water  (240 milliliters)  Copyright 2020  Academy of [employer]. All rights reserved   Heart Failure Nutrition Therapy For The Undernourished  This nutrition therapy will help you eat more calories and protein in addition to helping your heart. These suggestions can help you if you can't eat enough, have lost weight, or need extra calories and protein in your diet. This plan focuses on: Eating more calories.  Eating more calories can give you more energy, help you gain weight, and promote wound healing. Eating more protein. Protein can help you heal, give you energy, and build and repair muscle. Ask your registered dietitian nutritionist (RDN) how much protein is the right amount for you. Eating a low-sodium diet while increasing your calorie and protein intake. This will help control buildup of fluids around your heart, stomach, lungs, and legs. Too much sodium may make your blood pressure too high and put stress on your heart. You can achieve these goals by: Eating more calories and protein. Eating less than 2,000 milligrams of sodium per day. Reading food labels to keep track of how much sodium, protein, and calories are in the foods you eat. Limiting fluid intake if your doctor has asked you to follow a fluid restriction. Reading the Food Label: How Much Sodium Is too Much? The nutrition plan for heart failure usually limits the sodium that you get  from food and beverages to 2,000 milligrams per day. Salt is the main source of sodium. Read the nutrition label to find out how much sodium is in 1 serving. Foods with more than 300 milligrams of sodium per serving may not fit into a reduced-sodium meal plan. Check serving sizes on the label. If you eat more than 1 serving, you will get more sodium than the amount listed. You can find protein and calories on the Nutrition Facts label too. Eating More Protein and Calories Eat at least 6 small meals throughout the day. If you become full quickly after you begin eating, you may benefit from small, frequent meals instead of 3 large meals. Keep high-calorie and high-protein snacks available in your car or bag for when you get hungry. Add extra calories and protein when cooking meals. Cook with unsalted butter, margarine, or oils instead of calorie-free cooking spray. Use reduced-fat (2%) milk instead of fat-free (skim) milk. Ask your RDN if whole milk is recommended for you. Add milk powder to protein shakes, cereal, or casseroles. Sprinkle unsalted nuts and seeds into your cereal, stir-fry, or salad. Add 1 tablespoon mayonnaise, sugar, or honey to foods (adds 50 to 100 calories). Add calories and protein to your fruits and vegetables. Fruits are low in calories. Add peanut butter, yogurt, or cottage cheese to add calories and protein. Buy fruit cups canned in syrup instead of water  or juice. Vegetables are also low in calories. Add butter, sour cream, margarine, or oil for extra calories. Potatoes, corn, and peas are higher in calories than nonstarchy  vegetables. Avocados are rich in calories and healthy fat. Eat them alone or use them as a topping or spread. Limit foods that have little to no nutrition. Eat fewer calorie-free and low-calorie foods such as applesauce, Jell-O, and diet soft drinks. These foods will take up space in your stomach and may leave little room for higher-calorie foods and  beverages.  When shopping, avoid products that say "low calorie" or "low fat." Drink high-calorie beverages. There are several liquid nutrition supplements available that also have less than 300 milligrams of sodium per serving. Drink them between meals as snacks.  Make your own supplement at home with milk or ice cream.  Ask your RDN if protein powder would also be a good addition. Milk and juice are high in calories. Limit plain tea, coffee, and water . These have no calories.  Foods Recommended Food Group Foods Recommended  Grains Whole wheat bread with less than 80 milligrams of sodium per slice  Many cold cereals, especially shredded wheat and granola Oats or cream of wheat made with milk (add butter or margarine, sugar, dried fruit, and nuts for more calories and fat) Quinoa Wheat germ (sprinkle on soups, baked goods, or cereal)  Vegetables Fresh and frozen vegetables without added sauces, salt, or sodium Homemade soups (salt free or low sodium) with dry milk powder or cream Potatoes, corn, and peas  Fruits Canned fruits (canned in heavy syrup) Dried fruits, such as raisins, cranberries, and prunes Avocados  Dairy (Milk and Milk Products) Milk or milk powder Soy milk Yogurt, including Greek yogurt Small amounts of natural, blocked cheeses or reduced-sodium cheese (Swiss, ricotta, and fresh mozzarella are lower in sodium than others) Regular or soft cream cheese and low-sodium cottage cheese  Protein Foods (Meat, Poultry, Fish, and Armed forces logistics/support/administrative officer) [employer] and fish [place] bacon (check the Nutrition Facts label to make sure its not packaged in a sodium solution) Tuna canned or packed in oil Dried beans, peas, and legumes; edamame (fresh soybeans) Eggs Unsalted nuts or peanut butter  Desserts and Snacks Apple (with peanut butter); baked apple with sugar and butter             Granola bars Pudding made with reduced-fat (2%) milk or milk powder Smoothies  Custard Chocolate syrup added  to milk or ice cream  Fats Tub or liquid margarine (trans fat-free) Butter (check with your doctor or RDN first) Unsaturated fat oils (canola, olive, corn, sunflower, safflower, peanut, vegetable, and soybean) Flaxseed (oil or ground)  Sodium-Free Condiments Fresh or dried herbs; pepper; vinegar; lemon juice or lime juice; salt-free seasoning mixes and marinades such as Mrs. Dash or McC[employer] salt-free blend; simple salad dressings such as vinegar and oil; low-sodium ketchup. You can purchase salt-free barbecue sauce and many others on the Internet.  Ask your RDN.     Foods Not Recommended Food Group Foods Not Recommended  Grains Breads or crackers topped with salt Cereals (hot/cold) with more than 300 milligrams sodium per serving Biscuits, cornbread, and other "quick" breads prepared with baking soda Prepackaged bread crumbs Self-rising flours  Vegetables Canned vegetables (unless they are salt free or low sodium) Frozen vegetables with high-sodium seasoning and sauces Sauerkraut and pickled vegetables Canned or dried soups (unless they are salt free or low  sodium) [place] fries and onion rings Broth-based soups  Fruits Dried fruits preserved with sodium-containing additives  Dairy (Milk and Milk Products) Buttermilk Processed cheeses such as Cheese Wiz, Velveeta, and Queso Feta cheese Shredded cheese (has more sodium than blocked  cheeses) "Singles" cheese slices and string cheese  Protein Foods (Meat, Poultry, Fish, and Beans) Cured meats (bacon, ham, sausage, pepperoni, and hot dogs)  Canned meats (chili, Vienna sausage, sardines, and Spam) Smoked fish and meats Frozen meals with more than 600 milligrams of sodium Egg Beaters   Fats Salted butter or margarine  Condiments Salt, sea salt, kosher salt, onion salt, and garlic salt Seasoning mixes containing salt such as Lemon Pepper or Lawry's Bouillon cubes Catsup or ketchup Barbecue sauce and Worcestershire sauce Soy  sauce Salsa, pickles, olives, relish Salad dressings: ranch, blue cheese, [place], and [place]  Alcohol Check with your doctor.   Heart Failure (Undernourished) Sample 1-Day Menu View Nutrient Info Breakfast 1 cup oatmeal made with milk 1 tablespoon wheat germ (sprinkle on oatmeal) 1 cup (240 milliliters) reduced-fat (2%) milk 2 tablespoons chocolate syrup (add to milk) 1 banana 2 tablespoons peanut butter (for banana)  Morning Snack 1/4 cup raw almonds Cranberries (add to almonds) 1 cup (240 milliliters) oral nutritional supplement  Lunch 3 ounces grilled chicken breast 1 small potato 2 ounces cheese (to add to potato) 2 tablespoons margarine (to add to potato) Croutons (add to salad) Walnuts (add to salad) Vinegar and oil dressing (add to salad) 1 cup (240 milliliters) juice  Afternoon Snack 10 tortilla chips Guacamole (avocado, lime juice, onion, and tomatoes, and pepper) 1 cup (240 milliliters) water  or juice  Evening Meal 3 ounces herb-baked fish 1 cup homemade mashed potatoes with margarine (trans fat-free) and milk 1/2 cup creamed spinach 1 cup (240 milliliters) water  or juice  Evening Snack 1 cup (240 milliliters) homemade milkshake 1 slice low-sodium [place] breast 1 slice whole wheat bread 1 slice cheese Mayonnaise  Daily Sum Nutrient Unit Value  Macronutrients  Energy kcal 3461  Energy kJ 14480  Protein g 151  Total lipid (fat) g 158  Carbohydrate, by difference g 381  Fiber, total dietary g 36  Sugars, total g 163  Minerals  Calcium , Ca mg 2598  Iron , Fe mg 32  Sodium, Na mg 2530  Vitamins  Vitamin C, total ascorbic acid mg 180  Vitamin A, IU IU 9009  Vitamin D  IU 575  Lipids  Fatty acids, total saturated g 43  Fatty acids, total monounsaturated g 56  Fatty acids, total polyunsaturated g 48  Cholesterol mg 252     Heart Failure (Undernourished) Vegan Sample 1-Day Menu View Nutrient Info Breakfast 1 cup oatmeal made with: 1 cup soymilk fortified  with calcium , vitamin B12, and vitamin D  2 tablespoons walnuts, unsalted 1 banana 1 cup orange juice with added calcium  and vitamin D   Morning Snack  cup almond butter, unsalted 1 apple  cup granola  cup soy yogurt  Lunch 1 black bean burger, low sodium 1 hamburger bun 1 cup lettuce for salad with:  cup cucumbers, sliced  cup carrots, shredded 1 tablespoon cashews, unsalted 1 tablespoon sesame seed dressing, low sodium  cup pineapple  Afternoon Snack 1 pita, whole wheat  cup hummus  cup grapes  Evening Meal 1 cup cooked rice 1 cup red beans, cooked 1 cup corn, cooked  cup cucumber slices  cup tomato, diced  Evening Snack Smoothie made with: 1 cup soymilk fortified with calcium , vitamin B12, and vitamin D  1 scoop soy protein powder, for smoothie 1 cup strawberries, for smoothie  Daily Sum Nutrient Unit Value  Macronutrients  Energy kcal 2831  Energy kJ 11850  Protein g 121  Total lipid (fat) g 85  Carbohydrate, by difference g  428  Fiber, total dietary g 71  Sugars, total g 154  Minerals  Calcium , Ca mg 2162  Iron , Fe mg 45  Sodium, Na mg 2054  Vitamins  Vitamin C, total ascorbic acid mg 296  Vitamin A, IU IU 17869  Vitamin D  IU 318  Lipids  Fatty acids, total saturated g 9  Fatty acids, total monounsaturated g 36  Fatty acids, total polyunsaturated g 30  Cholesterol mg 0     Heart Failure (Undernourished) Vegetarian (Lacto-Ovo) Sample 1-Day Menu View Nutrient Info Breakfast 1 cup oatmeal made with: 1 cup 2% milk 1 slice toast, whole wheat topped with:  cup avocado 1 cup orange juice with added calcium  and vitamin D   Morning Snack  cup almonds, unsalted 2 tablespoons dried cranberries (add to almonds) 1 cup [place] yogurt, plain  Lunch Sandwich made with: 2 slices bread, whole wheat 2 tablespoons peanut butter, unsalted 1 tablespoon jam 4 carrot sticks with: 2 tablespoons hummus 1 banana  Afternoon Snack  cup granola  cup peanuts,  unsalted 1 apple  Evening Meal 1 cup black bean soup, low sodium  cup tofu, cooked  cup cooked brown rice  cup broccoli, cooked  cup carrots, cooked  cup onions, cooked 2 tablespoons peanut oil  Evening Snack 5 crackers, whole wheat, low sodium 3 dried figs 1 cup 2% milk  Daily Sum Nutrient Unit Value  Macronutrients  Energy kcal 3020  Energy kJ 12636  Protein g 117  Total lipid (fat) g 133  Carbohydrate, by difference g 375  Fiber, total dietary g 58  Sugars, total g 148  Minerals  Calcium , Ca mg 2197  Iron , Fe mg 29  Sodium, Na mg 1434  Vitamins  Vitamin C, total ascorbic acid mg 174  Vitamin A, IU IU 20295  Vitamin D  IU 317  Lipids  Fatty acids, total saturated g 27  Fatty acids, total monounsaturated g 62  Fatty acids, total polyunsaturated g 34  Cholesterol mg 63    Copyright 2020  [employer]. All rights reserved

## 2024-03-27 DIAGNOSIS — I5033 Acute on chronic diastolic (congestive) heart failure: Secondary | ICD-10-CM | POA: Diagnosis not present

## 2024-03-27 DIAGNOSIS — N179 Acute kidney failure, unspecified: Secondary | ICD-10-CM | POA: Diagnosis not present

## 2024-03-27 DIAGNOSIS — R600 Localized edema: Secondary | ICD-10-CM | POA: Diagnosis not present

## 2024-03-27 DIAGNOSIS — I482 Chronic atrial fibrillation, unspecified: Secondary | ICD-10-CM | POA: Diagnosis not present

## 2024-03-27 LAB — CBC
HCT: 33.3 % — ABNORMAL LOW (ref 36.0–46.0)
Hemoglobin: 11.3 g/dL — ABNORMAL LOW (ref 12.0–15.0)
MCH: 31 pg (ref 26.0–34.0)
MCHC: 33.9 g/dL (ref 30.0–36.0)
MCV: 91.5 fL (ref 80.0–100.0)
Platelets: 155 10*3/uL (ref 150–400)
RBC: 3.64 MIL/uL — ABNORMAL LOW (ref 3.87–5.11)
RDW: 13 % (ref 11.5–15.5)
WBC: 7.7 10*3/uL (ref 4.0–10.5)
nRBC: 0 % (ref 0.0–0.2)

## 2024-03-27 LAB — BASIC METABOLIC PANEL WITH GFR
Anion gap: 7 (ref 5–15)
BUN: 18 mg/dL (ref 8–23)
CO2: 29 mmol/L (ref 22–32)
Calcium: 8.7 mg/dL — ABNORMAL LOW (ref 8.9–10.3)
Chloride: 103 mmol/L (ref 98–111)
Creatinine, Ser: 1.12 mg/dL — ABNORMAL HIGH (ref 0.44–1.00)
GFR, Estimated: 48 mL/min — ABNORMAL LOW (ref >60)
Glucose, Bld: 100 mg/dL — ABNORMAL HIGH (ref 70–99)
Potassium: 3.1 mmol/L — ABNORMAL LOW (ref 3.5–5.1)
Sodium: 139 mmol/L (ref 135–145)

## 2024-03-27 LAB — MAGNESIUM: Magnesium: 1.8 mg/dL (ref 1.7–2.4)

## 2024-03-27 MED ORDER — BUMETANIDE 2 MG PO TABS
ORAL_TABLET | ORAL | Status: DC
Start: 2024-03-27 — End: 2024-04-09

## 2024-03-27 MED ORDER — POTASSIUM CHLORIDE 20 MEQ PO PACK
40.0000 meq | PACK | Freq: Once | ORAL | Status: AC
  Administered 2024-03-27: 40 meq via ORAL
  Filled 2024-03-27: qty 2

## 2024-03-27 MED ORDER — DILTIAZEM HCL 25 MG/5ML IV SOLN
15.0000 mg | Freq: Once | INTRAVENOUS | Status: AC
  Administered 2024-03-27: 15 mg via INTRAVENOUS
  Filled 2024-03-27: qty 5

## 2024-03-27 MED ORDER — FLUCONAZOLE 200 MG PO TABS
200.0000 mg | ORAL_TABLET | Freq: Once | ORAL | Status: AC
  Administered 2024-03-27: 200 mg via ORAL
  Filled 2024-03-27: qty 1

## 2024-03-27 NOTE — Evaluation (Addendum)
 Physical Therapy Evaluation Patient Details Name: Catherine [name] MRN: [id number] DOB: [birth year] Today's Date: 03/27/2024  History of Present Illness  The pt is an 89 yo female presenting 6/28 with SOB and LE swelling. Work up revealed CHF exacerbation and possible CAP. PMH includes: afib on coumadin , severe mitral stenosis s/p valve replacement, CAD s/p CABG x1, HTN, HLD, HFpEF, chronic back pain with past surgeries, and CKD III.   Clinical Impression  Pt in bed upon arrival of PT, agreeable to evaluation at this time. Prior to admission the pt was independent without use of DME, reports still driving and running errands, independent with ADLs and IADLs, but generally with decreased energy and endurance since PPM in December 2024. The pt was able to complete sit-stand transfer without assistance or UE support, and complete good distance of hallway ambulation with balance challenge without DME. She did have single LOB with gait while turning needing minA to recover, but was generally stable with other challenges. Pt will benefit from skilled PT to progress functional strength, power, and stability as she is generally deconditioned and frail given decreased mobility over last 6 months. Will be safe to return home with family support when medically stable.   5X Sit-to-Stand: 14.4 sec (> 14.8 sec indicates increased risk of falls for individuals aged 84-89, > 15 sec indicates increased risk of recurrent falls)     If plan is discharge home, recommend the following: A little help with walking and/or transfers;A little help with bathing/dressing/bathroom;Help with stairs or ramp for entrance   Can travel by private vehicle        Equipment Recommendations None recommended by PT  Recommendations for Other Services       Functional Status Assessment Patient has had a recent decline in their functional status and demonstrates the ability to make significant improvements in function in a reasonable  and predictable amount of time.     Precautions / Restrictions Precautions Precautions: Fall Recall of Precautions/Restrictions: Intact Restrictions Weight Bearing Restrictions Per Provider Order: No      Mobility  Bed Mobility Overal bed mobility: Needs Assistance Bed Mobility: Supine to Sit     Supine to sit: Supervision     General bed mobility comments: increased time, no assist    Transfers Overall transfer level: Needs assistance Equipment used: None Transfers: Sit to/from Stand Sit to Stand: Supervision           General transfer comment: supervision without DME, steady    Ambulation/Gait Ambulation/Gait assistance: Contact guard assist, Min assist Gait Distance (Feet): 250 Feet Assistive device: None Gait Pattern/deviations: Step-through pattern, Decreased stride length, Narrow base of support Gait velocity: decreased Gait velocity interpretation: 1.31 - 2.62 ft/sec, indicative of limited community ambulator   General Gait Details: pt with single episode of LOB with scissoring step when turning, minA to correct. generally stable with balance challenges without UE support.      Balance Overall balance assessment: Mild deficits observed, not formally tested                               Standardized Balance Assessment Standardized Balance Assessment : Dynamic Gait Index   Dynamic Gait Index Level Surface: Normal Change in Gait Speed: Mild Impairment Gait with Horizontal Head Turns: Mild Impairment Gait with Vertical Head Turns: Mild Impairment Gait and Pivot Turn: Mild Impairment Step Over Obstacle: Mild Impairment Step Around Obstacles: Normal Steps: Mild Impairment Total Score: 18  Pertinent Vitals/Pain Pain Assessment Pain Assessment: No/denies pain    Home Living Family/patient expects to be discharged to:: Private residence Living Arrangements: Spouse/significant other;Children Available Help at Discharge:  Family;Available 24 hours/day Type of Home: House Home Access: Level entry       Home Layout: One level (one step from sunroom to deck) Home Equipment: Agricultural consultant (2 wheels);Cane - single point      Prior Function Prior Level of Function : Independent/Modified Independent;Driving             Mobility Comments: pt reports largely sedentary since PPM placement due to not feeling good but is able to move without DME or assist. no falls. no consistent exercise ADLs Comments: independent     Extremity/Trunk Assessment   Upper Extremity Assessment Upper Extremity Assessment: Generalized weakness    Lower Extremity Assessment Lower Extremity Assessment: Generalized weakness (pt frail but grossly 4-/5 to MMT. no overt buckling with mobility or difference in sensation)    Cervical / Trunk Assessment Cervical / Trunk Assessment: Kyphotic;Other exceptions Cervical / Trunk Exceptions: frail  Communication   Communication Communication: No apparent difficulties    Cognition Arousal: Alert Behavior During Therapy: WFL for tasks assessed/performed   PT - Cognitive impairments: No apparent impairments, No family/caregiver present to determine baseline                       PT - Cognition Comments: pt able to follow all instructions and give detailed hx of cardiac issues. no family present to confirm Following commands: Intact       Cueing Cueing Techniques: Verbal cues     General Comments General comments (skin integrity, edema, etc.): SpO2 stable, HR to 145bpm at rest, recovered to 70s and then maintained in 90s during ambulaiton    Exercises Other Exercises Other Exercises: 5x sit-stand: 14.4 sec   Assessment/Plan    PT Assessment Patient needs continued PT services  PT Problem List Decreased strength;Decreased range of motion;Decreased activity tolerance;Decreased balance;Decreased mobility       PT Treatment Interventions DME instruction;Gait  training;Stair training;Functional mobility training;Therapeutic activities;Therapeutic exercise;Balance training;Patient/family education    PT Goals (Current goals can be found in the Care Plan section)  Acute Rehab PT Goals Patient Stated Goal: return to independence and improved energy PT Goal Formulation: With patient Time For Goal Achievement: 04/10/24 Potential to Achieve Goals: Good Additional Goals Additional Goal #1: Pt will complete DGI and score <19/24 to indicate reduced risk of falls.    Frequency Min 2X/week        AM-PAC PT 6 Clicks Mobility  Outcome Measure Help needed turning from your back to your side while in a flat bed without using bedrails?: None Help needed moving from lying on your back to sitting on the side of a flat bed without using bedrails?: None Help needed moving to and from a bed to a chair (including a wheelchair)?: A Little Help needed standing up from a chair using your arms (e.g., wheelchair or bedside chair)?: A Little Help needed to walk in hospital room?: A Little Help needed climbing 3-5 steps with a railing? : A Little 6 Click Score: 20    End of Session Equipment Utilized During Treatment: Gait belt Activity Tolerance: Patient tolerated treatment well Patient left: in chair;with call bell/phone within reach;with chair alarm set;with nursing/sitter in room Nurse Communication: Mobility status PT Visit Diagnosis: Unsteadiness on feet (R26.81);Muscle weakness (generalized) (M62.81);Other abnormalities of gait and mobility (R26.89)  Time: 8968-8940 PT Time Calculation (min) (ACUTE ONLY): 28 min   Charges:   PT Evaluation $PT Eval Low Complexity: 1 Low PT Treatments $Therapeutic Exercise: 8-22 mins PT General Charges $$ ACUTE PT VISIT: 1 Visit         Izetta Call, PT, DPT   Acute Rehabilitation Department Office [phone] Secure Chat Communication Preferred  Izetta JULIANNA Call 03/27/2024, 11:51 AM

## 2024-03-27 NOTE — Progress Notes (Signed)
 Heart Failure Navigator Progress Note  Assessed for Heart & Vascular TOC clinic readiness.  Patient does not meet criteria due to EF 65-70%, has a scheduled [facility] appointment on 05/22/2024. No HF TOC. .   Navigator will sign off at this time.   Stephane Haddock, BSN, Scientist, clinical (histocompatibility and immunogenetics) Only

## 2024-03-27 NOTE — TOC CM/SW Note (Signed)
 Transition of Care [facility]) - Inpatient Brief Assessment   Patient Details  Name: Catherine [name] MRN: [id number] Date of Birth: 03/21/1935  Transition of Care [facility]) CM/SW Contact:    Sudie Erminio Deems, RN Phone Number: 03/27/2024, 1:18 PM   Clinical Narrative: Patient presented for dyspnea. PTA patient states she is from home with spouse. PT/OT has worked with the patient and recommendations are for outpatient PT- Ambulatory referral submitted to the [facility]. Patient states she has two physicians at this location and is very familiar. Patient states she will have transportation. Office to call the patient with a visit time. No further needs identified at this time.     Transition of Care Asessment: Insurance and Status: Insurance coverage has been reviewed Patient has primary care physician: Yes Home environment has been reviewed: reviewed Prior level of function:: independent Prior/Current Home Services: No current home services Social Drivers of Health Review: SDOH reviewed no interventions necessary Readmission risk has been reviewed: Yes Transition of care needs: no transition of care needs at this time

## 2024-03-27 NOTE — Plan of Care (Signed)
  Problem: Education: Goal: Knowledge of General Education information will improve Description: Including pain rating scale, medication(s)/side effects and non-pharmacologic comfort measures Outcome: Adequate for Discharge   Problem: Health Behavior/Discharge Planning: Goal: Ability to manage health-related needs will improve Outcome: Adequate for Discharge   Problem: Clinical Measurements: Goal: Ability to maintain clinical measurements within normal limits will improve Outcome: Adequate for Discharge Goal: Will remain free from infection Outcome: Adequate for Discharge Goal: Diagnostic test results will improve Outcome: Adequate for Discharge Goal: Respiratory complications will improve Outcome: Adequate for Discharge Goal: Cardiovascular complication will be avoided Outcome: Adequate for Discharge   Problem: Activity: Goal: Risk for activity intolerance will decrease Outcome: Adequate for Discharge   Problem: Nutrition: Goal: Adequate nutrition will be maintained Outcome: Adequate for Discharge   Problem: Coping: Goal: Level of anxiety will decrease Outcome: Adequate for Discharge   Problem: Safety: Goal: Ability to remain free from injury will improve Outcome: Adequate for Discharge   Problem: Skin Integrity: Goal: Risk for impaired skin integrity will decrease Outcome: Adequate for Discharge   Problem: Food- and Nutrition-Related Knowledge Deficit (NB-1.1) Goal: Nutrition education Description: Formal process to instruct or train a patient/client in a skill or to impart knowledge to help patients/clients voluntarily manage or modify food choices and eating behavior to maintain or improve health. Outcome: Adequate for Discharge   Problem: Acute Rehab PT Goals(only PT should resolve) Goal: Patient Will Transfer Sit To/From Stand Outcome: Adequate for Discharge Goal: Pt Will Ambulate Outcome: Adequate for Discharge Goal: Pt/caregiver will Perform Home Exercise  Program Outcome: Adequate for Discharge Goal: PT Additional Goal #1 Outcome: Adequate for Discharge

## 2024-03-27 NOTE — Discharge Summary (Signed)
 Physician Discharge Summary   Patient: Catherine [name] MRN: [id number] DOB: September 18, 1935  Admit date:     03/24/2024  Discharge date: 03/27/24  Discharge Physician: Carliss LELON Canales   PCP: Micheal Wolm LELON, MD   Recommendations at discharge:    Pt to be discharged home.   If you experience worsening fever, chills, chest pain, shortness of breath, or other concerning symptoms, please call your PCP or go to the emergency department immediately.  Discharge Diagnoses: Active Problems:   Atrial fibrillation, chronic (HCC)   Acute on chronic heart failure with preserved ejection fraction (HFpEF) (HCC)   AKI (acute kidney injury) (HCC)   CAP (community acquired pneumonia)   Hyperkalemia   Peripheral edema  Resolved Problems:   * No resolved hospital problems. *   Hospital Course:  89 y.o. female with medical history significant of paroxysmal A-fib on Coumadin , severe mitral stenosis status post bioprosthetic mitral valve replacement, coronary artery disease status post single-vessel CABG, hypertension, hyperlipidemia with statin and Zetia  intolerance, chronic HFpEF (EF 65-70% in 10/2023), chronic back pain with prior back surgeries, chronic insomnia, and CKD 3A p/w SOB iso CAP c/b ADHF on admission XR.    Assessment and Plan:   Acute exacerbation of chronic HFpEF - Noted dyspnea on exertion, lower extremity edema worsening dyspnea on presentation.  Worsening swelling prior to admission per cardiology note.  Showed excellent response to IV diuresis to 2 mg IV Bumex  every 8 hours with 10 mg metolazone  x 1.  Patient no longer dyspneic, ambulatory, eager for discharge.  Will transition patient to to p.o. Bumex  2 mg twice daily for the next 3 days then resume normal home regimen Bumex  2 mg daily.  Continue to assess weight daily.  Follow-up with cardiologist in 1 week.  Possible community-acquired pneumonia - Chest x-ray noting bilateral consolidations with concern for pneumonia on presentation.   He was initiated on empiric ceftriaxone  plus azithromycin .  Chest x-ray personally reviewed more likely consistent with edema.  Influenza/COVID/RSV rapid swabs negative.  Will discontinue antibiotics upon discharge.  Pneumonia ruled out.  Atrial fibrillation - Had 1 episode of RVR with rates in the 130s, patient asymptomatic at the time.  Was given 1 dose of IV Cardizem with prompt resolution.  Patient was previously on bisoprolol  but was instructed to discontinue by her outpatient cardiologist.  Would recommend that patient follow-up with her cardiologist to discuss whether or not continuing her bisoprolol  is warranted.  Continue Eliquis  2.5 mg twice daily.     COPD - Does not appear to be in acute exacerbation.  No wheezing or cough appreciated.  Nebulizers as needed.   Physical debilitation muscle weakness - Likely exacerbated by above.  Advanced age with underlying volume overload in the setting of CHF exacerbation.  PT recommending outpatient PT.   Acute kidney injury - Likely prerenal etiology.  Creatinine 1.19 on presentation (previous baseline 0.7).  Showing improvement throughout the course hospital stay.  Mild hyperkalemia - Potassium 5.4 on presentation.  Showing improvement after diuresis.  Resolved.  Consultants: None Procedures performed: None Disposition: Home Diet recommendation:  Discharge Diet Orders (From admission, onward)     Start     Ordered   03/27/24 0000  Diet - low sodium heart healthy        03/27/24 1204           Cardiac diet  DISCHARGE MEDICATION: Allergies as of 03/27/2024       Reactions   Aldactone  [spironolactone ] Other (See Comments)  Dyspnea    Amoxil  [amoxicillin ] Palpitations, Other (See Comments)   Tachycardia   Zebeta  [bisoprolol ] Shortness Of Breath   Calan  Erth.Fast ] Other (See Comments)   Myalgias    Crestor  [rosuvastatin ] Other (See Comments)   Myalgias    Flovent  [fluticasone  Propionate] Other (See Comments)   Leg cramps    Livalo  [pitavastatin ] Other (See Comments)   Myalgias    Lyrica [pregabalin] Swelling   Neurontin [gabapentin] Swelling   Norvasc  [amlodipine ] Swelling   Low extremity edema   Zetia  [ezetimibe ] Other (See Comments)   Myalgias   Zocor  [simvastatin ] Other (See Comments)   Myalgias    Lotensin [benazepril] Cough   Polytrim  [polymyxin B -trimethoprim ] Swelling        Medication List     STOP taking these medications    clindamycin  300 MG capsule Commonly known as: Cleocin        TAKE these medications    acetaminophen  500 MG tablet Commonly known as: TYLENOL  Take 500 mg by mouth 2 (two) times daily.   albuterol  (2.5 MG/3ML) 0.083% nebulizer solution Commonly known as: PROVENTIL  Take 3 mLs (2.5 mg total) by nebulization every 6 (six) hours as needed (during COPD Exacerbation).   albuterol  108 (90 Base) MCG/ACT inhaler Commonly known as: VENTOLIN  HFA INHALE 2 PUFFS INTO THE LUNGS EVERY 4 HOURS AS NEEDED FOR AHEEZING OR SHORTNESS OF BREATH   bumetanide  2 MG tablet Commonly known as: BUMEX  Take 1 tablet (2 mg total) by mouth 2 (two) times daily for 3 days, THEN 1 tablet (2 mg total) daily. Start taking on: March 27, 2024 What changed: See the new instructions.   cholecalciferol 25 MCG (1000 UNIT) tablet Commonly known as: VITAMIN D3 Take 1,000 Units by mouth daily.   diazepam  5 MG tablet Commonly known as: VALIUM  TAKE ONE TABLET BY MOUTH AT BEDTIME   Eliquis  2.5 MG Tabs tablet Generic drug: apixaban  TAKE ONE TABLET TWICE DAILY (START 09/18/23 OR AS DIRECTED)   Entresto  24-26 MG Generic drug: sacubitril -valsartan  Take 1 tablet by mouth 2 (two) times daily.   estradiol  0.1 MG/GM vaginal cream Commonly known as: ESTRACE  Place 1 Applicatorful vaginally See admin instructions. Apply a small amount vaginally three nights a week on Monday, Wednesday and Friday.   fluticasone  50 MCG/ACT nasal spray Commonly known as: FLONASE  Place 1 spray into both nostrils daily  as needed for allergies or rhinitis.   HYDROcodone -acetaminophen  5-325 MG tablet Commonly known as: NORCO/VICODIN TAKE ONE TABLET EVERY 8 HOURS AS NEEDED FOR PAIN What changed: See the new instructions.   metolazone  5 MG tablet Commonly known as: ZAROXOLYN  Take 1 tablet (5 mg total) by mouth as directed. TAKE ONE TABLET AS NEEDED ONCE A WEEK 30 MINUTES BEFORE BUMEX  DOSE. AS NEEDED ONLY FOR WEIGHT GAIN 2LBS OVERNIGHT OR 5 LBS IN ONE WEEK   mupirocin  ointment 2 % Commonly known as: BACTROBAN  APPLY TOPICALLY ONCE A DAY   potassium chloride  10 MEQ tablet Commonly known as: KLOR-CON  Take 40 mEq by mouth See admin instructions. Take 4 tablets (40mEq) twice daily, after breakfast and after dinner.         Discharge Exam: Filed Weights   03/24/24 1843 03/25/24 0639 03/26/24 1715  Weight: 56.9 kg 55.3 kg 55.3 kg    GENERAL:  Alert, pleasant, no acute distress  HEENT:  EOMI CARDIOVASCULAR:  RRR, no murmurs appreciated RESPIRATORY: Poor air movement bilaterally, improved GASTROINTESTINAL:  Soft, nontender, nondistended EXTREMITIES: 1+ R>L BL LE pitting edema below the ankle NEURO:  No new focal  deficits appreciated SKIN: Scattered ecchymosis  PSYCH:  Appropriate mood and affect    Condition at discharge: improving  The results of significant diagnostics from this hospitalization (including imaging, microbiology, ancillary and laboratory) are listed below for reference.   Imaging Studies: DG Chest Port 1 View Result Date: 03/24/2024 CLINICAL DATA:  Congestion and shortness of breath. EXAM: PORTABLE CHEST 1 VIEW COMPARISON:  October 03, 2023 FINDINGS: There is stable dual lead AICD positioning. Multiple sternal wires are noted. The cardiac silhouette is mildly enlarged and unchanged in size. Mild diffusely increased interstitial lung markings are seen which may be, in part, chronic in nature. Mild to moderate severity areas of atelectasis and/or infiltrate are noted within the  bilateral lung bases. There is a small bilateral pleural effusions, right greater than left. No pneumothorax is identified. Multilevel degenerative changes are seen throughout the thoracic spine. IMPRESSION: 1. Stable cardiomegaly with mild to moderate severity bibasilar atelectasis and/or infiltrate. 2. Small bilateral pleural effusions. Electronically Signed   By: Suzen Dials M.D.   On: 03/24/2024 19:17   CUP PACEART REMOTE DEVICE CHECK Result Date: 03/23/2024 PPM scheduled remote reviewed. Normal device function.  Presenting rhythm: AP-VP Next remote 91 days. AB, CVRS   Microbiology: Results for orders placed or performed during the hospital encounter of 03/24/24  Blood culture (routine x 2)     Status: None (Preliminary result)   Collection Time: 03/24/24 10:40 PM   Specimen: BLOOD  Result Value Ref Range Status   Specimen Description   Final    BLOOD LEFT ANTECUBITAL Performed at [facility], [address], [place], [place] [address]    Special Requests   Final    BOTTLES DRAWN AEROBIC AND ANAEROBIC Blood Culture adequate volume Performed at [facility], [address] [place], [place] [address]    Culture   Final    NO GROWTH 2 DAYS Performed at [facility], [address] [place], [place] [address]    Report Status PENDING  Incomplete  Resp panel by RT-PCR (RSV, Flu A&B, Covid) Anterior Nasal Swab     Status: None   Collection Time: 03/24/24 10:40 PM   Specimen: Anterior Nasal Swab  Result Value Ref Range Status   SARS Coronavirus 2 by RT PCR NEGATIVE NEGATIVE Final    Comment: (NOTE) SARS-CoV-2 target nucleic acids are NOT DETECTED.  The SARS-CoV-2 RNA is generally detectable in upper respiratory specimens during the acute phase of infection. The lowest concentration of SARS-CoV-2 viral copies this assay can detect is 138 copies/mL. A negative result does not preclude SARS-Cov-2 infection and should not be used as  the sole basis for treatment or other patient management decisions. A negative result may occur with  improper specimen collection/handling, submission of specimen other than nasopharyngeal swab, presence of viral mutation(s) within the areas targeted by this assay, and inadequate number of viral copies(<138 copies/mL). A negative result must be combined with clinical observations, patient history, and epidemiological information. The expected result is Negative.  Fact Sheet for Patients:  BloggerCourse.com  Fact Sheet for Healthcare Providers:  SeriousBroker.it  This test is no t yet approved or cleared by the [place]  FDA and  has been authorized for detection and/or diagnosis of SARS-CoV-2 by FDA under an Emergency Use Authorization (EUA). This EUA will remain  in effect (meaning this test can be used) for the duration of the COVID-19 declaration under Section 564(b)(1) of the Act, 21 U.S.C.section 360bbb-3(b)(1), unless the authorization is terminated  or revoked sooner.       Influenza A by PCR NEGATIVE NEGATIVE Final   Influenza B by PCR NEGATIVE NEGATIVE Final    Comment: (NOTE) The Xpert Xpress SARS-CoV-2/FLU/RSV plus assay is intended as an aid in the diagnosis of influenza from Nasopharyngeal swab specimens and should not be used as a sole basis for treatment. Nasal washings and aspirates are unacceptable for Xpert Xpress SARS-CoV-2/FLU/RSV testing.  Fact Sheet for Patients: BloggerCourse.com  Fact Sheet for Healthcare Providers: SeriousBroker.it  This test is not yet approved or cleared by the [place]  FDA and has been authorized for detection and/or diagnosis of SARS-CoV-2 by FDA under an Emergency Use Authorization (EUA). This EUA will remain in effect (meaning this test can be used) for the duration of the COVID-19 declaration under Section 564(b)(1) of  the Act, 21 U.S.C. section 360bbb-3(b)(1), unless the authorization is terminated or revoked.     Resp Syncytial Virus by PCR NEGATIVE NEGATIVE Final    Comment: (NOTE) Fact Sheet for Patients: BloggerCourse.com  Fact Sheet for Healthcare Providers: SeriousBroker.it  This test is not yet approved or cleared by the [place]  FDA and has been authorized for detection and/or diagnosis of SARS-CoV-2 by FDA under an Emergency Use Authorization (EUA). This EUA will remain in effect (meaning this test can be used) for the duration of the COVID-19 declaration under Section 564(b)(1) of the Act, 21 U.S.C. section 360bbb-3(b)(1), unless the authorization is terminated or revoked.  Performed at [facility], [address], [place], [place] [address]    *Note: Due to a large number of results and/or encounters for the requested time period, some results have not been displayed. A complete set of results can be found in Results Review.    Labs: CBC: Recent Labs  Lab 03/24/24 1934 03/27/24 0439  WBC 8.0 7.7  HGB 11.9* 11.3*  HCT 36.3 33.3*  MCV 92.4 91.5  PLT 166 155   Basic Metabolic Panel: Recent Labs  Lab 03/24/24 1934 03/25/24 1246 03/26/24 0451 03/27/24 0439  NA 140 141 140 139  K 5.4* 4.6 3.6 3.1*  CL 106 103 107 103  CO2 24 22 25 29   GLUCOSE 105* 109* 92 100*  BUN 26* 23 20 18   CREATININE 1.19* 1.02* 0.92 1.12*  CALCIUM  9.3 8.8* 8.6* 8.7*  MG  --   --   --  1.8   Liver Function Tests: Recent Labs  Lab 03/25/24 1246  AST 31  ALT 11  ALKPHOS 80  BILITOT 1.1  PROT 4.9*  ALBUMIN  2.3*   CBG: No results for input(s): GLUCAP in the last 168 hours.  Discharge time spent: 38 minutes.  Length of inpatient stay: 2 days  Signed: Carliss LELON Canales, DO [facility] 03/27/2024

## 2024-03-28 ENCOUNTER — Telehealth: Payer: Self-pay

## 2024-03-28 ENCOUNTER — Telehealth: Payer: Self-pay | Admitting: *Deleted

## 2024-03-28 ENCOUNTER — Ambulatory Visit: Payer: Self-pay

## 2024-03-28 NOTE — Telephone Encounter (Signed)
 FYI Only or Action Required?: FYI only for provider.  Patient was last seen in primary care on 02/22/2024 by Micheal Wolm ORN, MD. Called Nurse Triage reporting Nausea. Symptoms began yesterday. Interventions attempted: OTC medications: dramamine and Rest, hydration, or home remedies. Symptoms are: gradually improving.  Triage Disposition: Home Care  Patient/caregiver understands and will follow disposition?: Yes    Message from [facility] sent at 03/28/2024 12:47 PM EDT  Summary: nausea   Nausea Callback # [phone]      Reason for Disposition  Unexplained nausea  MILD-MODERATE diarrhea (e.g., 1-6 times / day more than normal)  Answer Assessment - Initial Assessment Questions 1. NAUSEA SEVERITY: How bad is the nausea? (e.g., mild, moderate, severe; dehydration, weight loss)   - MILD: loss of appetite without change in eating habits   - MODERATE: decreased oral intake without significant weight loss, dehydration, or malnutrition   - SEVERE: inadequate caloric or fluid intake, significant weight loss, symptoms of dehydration     Moderate  2. ONSET: When did the nausea begin?     Yesterday after discharge from hospital 3. VOMITING: Any vomiting? If Yes, ask: How many times today?     no 4. RECURRENT SYMPTOM: Have you had nausea before? If Yes, ask: When was the last time? What happened that time?     No  5. CAUSE: What do you think is causing the nausea?   Unsure, maybe all the medicine they gave me in the hospital.   Additional info: 1) Discharged from hospital on 03/27/24, Atrial fibrillation, chronic (HCC) Acute on chronic heart failure with preserved ejection fraction (HFpEF) (HCC),   AKI (acute kidney injury) (HCC),   CAP (community acquired pneumonia),   Hyperkalemia,   Peripheral edema. Received pain med and antibiotics while inpatient.  2) Took dramamine 15 minutes prior to calling in and it is reducing her nausea.  Answer Assessment - Initial  Assessment Questions 1. DIARRHEA SEVERITY: How bad is the diarrhea? How many more stools have you had in the past 24 hours than normal?    - NO DIARRHEA (SCALE 0)   - MILD (SCALE 1-3): Few loose or mushy BMs; increase of 1-3 stools over normal daily number of stools; mild increase in ostomy output.   -  MODERATE (SCALE 4-7): Increase of 4-6 stools daily over normal; moderate increase in ostomy output.   -  SEVERE (SCALE 8-10; OR WORST POSSIBLE): Increase of 7 or more stools daily over normal; moderate increase in ostomy output; incontinence.     Mild  2. ONSET: When did the diarrhea begin?      Yesterday 3. BM CONSISTENCY: How loose or watery is the diarrhea?      Loose, mushy 4. VOMITING: Are you also vomiting? If Yes, ask: How many times in the past 24 hours?      No 5. ABDOMEN PAIN: Are you having any abdomen pain? If Yes, ask: What does it feel like? (e.g., crampy, dull, intermittent, constant)      no 6. ABDOMEN PAIN SEVERITY: If present, ask: How bad is the pain?  (e.g., Scale 1-10; mild, moderate, or severe)   - MILD (1-3): doesn't interfere with normal activities, abdomen soft and not tender to touch    - MODERATE (4-7): interferes with normal activities or awakens from sleep, abdomen tender to touch    - SEVERE (8-10): excruciating pain, doubled over, unable to do any normal activities       no 7. ORAL INTAKE: If vomiting,  Have you been able to drink liquids? How much liquids have you had in the past 24 hours?     yes 8. HYDRATION: Any signs of dehydration? (e.g., dry mouth [not just dry lips], too weak to stand, dizziness, new weight loss) When did you last urinate?     no 9. EXPOSURE: Have you traveled to a foreign country recently? Have you been exposed to anyone with diarrhea? Could you have eaten any food that was spoiled?     no 10. ANTIBIOTIC USE: Are you taking antibiotics now or have you taken antibiotics in the past 2 months?        yes 11. OTHER SYMPTOMS: Do you have any other symptoms? (e.g., fever, blood in stool)       nausea  Protocols used: Nausea-A-AH, Diarrhea-A-AH

## 2024-03-28 NOTE — Telephone Encounter (Signed)
 Alert remote transmission:  1 or more monitored VT episodes detected Events occurred 6/30 @ 22:31, 7/1 @ 06:37, HR's 155-162, V>A, > 20 beats  ___________________________________________________________________________   Patient recently in hospital for heart failure exacerbation. Patient was previously started on Toprol  for similar NSVT noted December 26, 2023 office visit. Multiple points of contact with patient after. Appears patient did not tolerate Toprol . Med changes were made. Last beta blocker noted to be Bisoprolol  which was discontinued just prior to recent hospitalization. At time of current episode patient is not on any AAD or rhythm rate controlling medication. Will route to provider for review and recommendations.

## 2024-03-28 NOTE — Transitions of Care (Post Inpatient/ED Visit) (Signed)
 03/28/2024  Name: Catherine [name] MRN: [id number] DOB: 04-20-1935  Today's TOC FU Call Status: Today's TOC FU Call Status:: Successful TOC FU Call Completed TOC FU Call Complete Date: 03/28/24 Patient's Name and Date of Birth confirmed.  Transition Care Management Follow-up Telephone Call Date of Discharge: 03/27/24 Discharge Facility: [facility]) Type of Discharge: Inpatient Admission Primary Inpatient Discharge Diagnosis:: hypoxia How have you been since you were released from the hospital?: Better Any questions or concerns?: Yes Patient Questions/Concerns:: I am having some nausea Patient Questions/Concerns Addressed: Notified Provider of Patient Questions/Concerns  Items Reviewed: Did you receive and understand the discharge instructions provided?: Yes Medications obtained,verified, and reconciled?: Yes (Medications Reviewed) Any new allergies since your discharge?: No Dietary orders reviewed?: No Do you have support at home?: Yes People in Home [RPT]: spouse Name of Support/Comfort Primary Source: Will  Medications Reviewed Today: Medications Reviewed Today     Reviewed by Kennieth Cathlean DEL, RN (Case Manager) on 03/28/24 at 1251  Med List Status: <None>   Medication Order Taking? Sig Documenting Provider Last Dose Status Informant  acetaminophen  (TYLENOL ) 500 MG tablet [id number] Yes Take 500 mg by mouth 2 (two) times daily. [provider]  Active Self, Pharmacy Records           Med Note (COFFELL, JON CHRISTELLA   Sun Mar 25, 2024 11:26 AM)    albuterol  (PROVENTIL ) (2.5 MG/3ML) 0.083% nebulizer solution [id number] Yes Take 3 mLs (2.5 mg total) by nebulization every 6 (six) hours as needed (during COPD Exacerbation). Hope Almarie ORN, NP  Active Self, Pharmacy Records  albuterol  (VENTOLIN  HFA) 108 [phone] Base) MCG/ACT inhaler [id number] Yes INHALE 2 PUFFS INTO THE LUNGS EVERY 4 HOURS AS NEEDED FOR AHEEZING OR SHORTNESS OF BREATH Parrett, Madelin RAMAN, NP  Active Self,  Pharmacy Records  apixaban  (ELIQUIS ) 2.5 MG TABS tablet [id number] Yes TAKE ONE TABLET TWICE DAILY (START 09/18/23 OR AS DIRECTED) Micheal Wolm ORN, MD  Active Self, Pharmacy Records  bumetanide  (BUMEX ) 2 MG tablet [id number] Yes Take 1 tablet (2 mg total) by mouth 2 (two) times daily for 3 days, THEN 1 tablet (2 mg total) daily. Arlon Carliss ORN, DO  Active   cholecalciferol (VITAMIN D3) 25 MCG (1000 UNIT) tablet [id number] Yes Take 1,000 Units by mouth daily. [provider]  Active Self, Pharmacy Records  diazepam  (VALIUM ) 5 MG tablet [id number] Yes TAKE ONE TABLET BY MOUTH AT BEDTIME Micheal Wolm ORN, MD  Active Self, Pharmacy Records  estradiol  (ESTRACE ) 0.1 MG/GM vaginal cream [id number] Yes Place 1 Applicatorful vaginally See admin instructions. Apply a small amount vaginally three nights a week on Monday, Wednesday and Friday. [provider]  Active Self, Pharmacy Records  fluticasone  (FLONASE ) 50 MCG/ACT nasal spray [id number] Yes Place 1 spray into both nostrils daily as needed for allergies or rhinitis. Shellia Oh, MD  Active Self, Pharmacy Records  HYDROcodone -acetaminophen  (NORCO/VICODIN) 5-325 MG tablet [id number] Yes TAKE ONE TABLET EVERY 8 HOURS AS NEEDED FOR PAIN Micheal Wolm ORN, MD  Active Self, Pharmacy Records  metolazone  (ZAROXOLYN ) 5 MG tablet [id number] Yes Take 1 tablet (5 mg total) by mouth as directed. TAKE ONE TABLET AS NEEDED ONCE A WEEK 30 MINUTES BEFORE BUMEX  DOSE. AS NEEDED ONLY FOR WEIGHT GAIN 2LBS OVERNIGHT OR 5 LBS IN ONE WEEK Walker, Caitlin S, NP  Active Self, Pharmacy Records  mupirocin  ointment (BACTROBAN ) 2 % [id number] Yes APPLY TOPICALLY ONCE A DAY Burchette, Wolm ORN, MD  Active Self, Pharmacy Records  potassium chloride  (KLOR-CON )  10 MEQ tablet [id number] Yes Take 40 mEq by mouth See admin instructions. Take 4 tablets (40mEq) twice daily, after breakfast and after dinner. [provider]  Active Self, Pharmacy Records   sacubitril -valsartan  (ENTRESTO ) 24-26 MG [id number] Yes Take 1 tablet by mouth 2 (two) times daily. Raford Riggs, MD  Active Self, Pharmacy Records           Med Note (COFFELL, JON HERO   Sun Mar 25, 2024 11:31 AM) No fill history found on dispense report in 2025. Pt states she is still taking.            Home Care and Equipment/Supplies: Were Home Health Services Ordered?: NA Any new equipment or medical supplies ordered?: NA  Functional Questionnaire: Do you need assistance with bathing/showering or dressing?: No Do you need assistance with meal preparation?: No Do you need assistance with eating?: No Do you have difficulty maintaining continence: No Do you need assistance with getting out of bed/getting out of a chair/moving?: No Do you have difficulty managing or taking your medications?: No  Follow up appointments reviewed: PCP Follow-up appointment confirmed?: Yes Date of PCP follow-up appointment?: 04/02/24 Follow-up Provider: Dr Wolm [facility] Follow-up appointment confirmed?: Yes Date of Specialist follow-up appointment?: 04/09/24 Follow-Up Specialty Provider:: Ozell Bane Do you need transportation to your follow-up appointment?: No Do you understand care options if your condition(s) worsen?: Yes-patient verbalized understanding  SDOH Interventions Today    Flowsheet Row Most Recent Value  SDOH Interventions   Food Insecurity Interventions Intervention Not Indicated  Housing Interventions Intervention Not Indicated  Transportation Interventions Intervention Not Indicated  Utilities Interventions Intervention Not Indicated  Depression Interventions/Treatment  PHQ2-9 Score <4 Follow-up Not Indicated    Goals Addressed             This Visit's Progress    VBCI Transitions of Care (TOC) Care Plan       Problems:  Recent Hospitalization for treatment of Acute hypoxemic respiratory failure Knowledge Deficit Related to Acute  hypoxemic respiratory failure  Goal:  Over the next 30 days, the patient will not experience hospital readmission  Interventions:  Transitions of Care: Doctor Visits  - discussed the importance of doctor visits Communication with PCP  re: Nausea Arranged PCP follow-up within 7 days (Care Guide Scheduled)  Patient Self Care Activities:  Attend all scheduled provider appointments Call pharmacy for medication refills 3-7 days in advance of running out of medications Call provider office for new concerns or questions  Notify RN Care Manager of TOC call rescheduling needs Participate in Transition of Care Program/Attend TOC scheduled calls Perform all self care activities independently  Perform IADL's (shopping, preparing meals, housekeeping, managing finances) independently Take medications as prescribed    Plan:  An initial telephone outreach has been scheduled for: [id number] Follow up with provider re: nausea. RN called PCP for order for nausea medication called in. Next PCP appointment scheduled for: [id number] Telephone follow up appointment with care management team member scheduled for: Alan Ee [id number] 11:00       RN Called PCP for nausea medication  Cathlean Headland BSN RN [facility] Care Management Coordinator [email or web address] Direct Dial: [phone]  Fax: [phone] Website: [email or web address]

## 2024-03-29 ENCOUNTER — Other Ambulatory Visit: Payer: Self-pay | Admitting: Family Medicine

## 2024-03-29 ENCOUNTER — Telehealth: Payer: Self-pay | Admitting: Pharmacist Clinician (PhC)/ Clinical Pharmacy Specialist

## 2024-03-29 DIAGNOSIS — E785 Hyperlipidemia, unspecified: Secondary | ICD-10-CM

## 2024-03-29 NOTE — Telephone Encounter (Signed)
 Lab orders mailed to patient.

## 2024-03-30 LAB — CULTURE, BLOOD (ROUTINE X 2)
Culture: NO GROWTH
Special Requests: ADEQUATE

## 2024-04-02 ENCOUNTER — Ambulatory Visit: Admitting: Family Medicine

## 2024-04-02 ENCOUNTER — Telehealth: Payer: Self-pay | Admitting: Family Medicine

## 2024-04-02 ENCOUNTER — Encounter: Payer: Self-pay | Admitting: Family Medicine

## 2024-04-02 VITALS — BP 92/56 | HR 81 | Temp 98.2°F | Wt 119.5 lb

## 2024-04-02 DIAGNOSIS — Z789 Other specified health status: Secondary | ICD-10-CM

## 2024-04-02 DIAGNOSIS — M5441 Lumbago with sciatica, right side: Secondary | ICD-10-CM

## 2024-04-02 DIAGNOSIS — E876 Hypokalemia: Secondary | ICD-10-CM | POA: Diagnosis not present

## 2024-04-02 DIAGNOSIS — I5033 Acute on chronic diastolic (congestive) heart failure: Secondary | ICD-10-CM

## 2024-04-02 DIAGNOSIS — R5383 Other fatigue: Secondary | ICD-10-CM | POA: Diagnosis not present

## 2024-04-02 DIAGNOSIS — N189 Chronic kidney disease, unspecified: Secondary | ICD-10-CM | POA: Diagnosis not present

## 2024-04-02 DIAGNOSIS — G8929 Other chronic pain: Secondary | ICD-10-CM

## 2024-04-02 DIAGNOSIS — I482 Chronic atrial fibrillation, unspecified: Secondary | ICD-10-CM

## 2024-04-02 DIAGNOSIS — N1831 Chronic kidney disease, stage 3a: Secondary | ICD-10-CM

## 2024-04-02 NOTE — Patient Instructions (Signed)
 Continue to elevate legs frequently  Continue daily weights and follow up with cardiology for weight gain 3 pounds in one day or 5 pounds in one week.  Call Rehab if they have  not called you in next few days.

## 2024-04-02 NOTE — Progress Notes (Signed)
 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  In January 2018 she underwent bioprosthetic MVR, CABG x 1, and bilateral maze procedure with Dr. Dusty.   Seen by Dr. Scarce 11/2020 with improvement in abdominal bloating after transition from Lasix  to torsemide .  Her persistent LE edema was thought to be due to venous insufficiency.  She was unable to tolerate compression socks and declined referral to [facility].  Hospitalized 10/2021 with COPD exacerbation and HFpEF.  Echo LVEF 60 to 65%, RV mildly reduced, RVSP 58 mmHg, moderate BAE, normal-appearing bioprosthetic MVR, moderate TR.  She was admitted 01/05/2022 for acute on chronic diastolic heart failure with acute hypoxic respiratory failure and COPD exacerbation.  She developed bradycardia, metoprolol  was reduced.  She was trialed on amiodarone  but did not tolerate.  At clinic visit 03/04/2022 her losartan  torsemide  were transition to Entresto  and Bumex .  03/2022 metolazone  added once per week for volume control.  Echo 11/03/2022 with normal LVEF 60 to 65%, RV moderately reduced function, mildly elevated PASP, MV prosthesis functioning appropriately, mild aortic stenosis with mean gradient 11 mmHg.  Despite further reduction and discontinuation of metoprolol , bradycardia persisted.  Admission 12/25-12/27/24 with placement of dual-chamber PPM.   Her atrial lead is passive per EP team.  She saw Dr. Kitty 12/26/2023 most bothered by leg and back pain.  Device interrogation appropriate in clinic.  [name], Toprol  was increased to 100 mg at bedtime, however she felt more dyspnea and requested to reduce back to 50 mg dose.  Last cardiology clinic visit was 01/03/2024 with Reche Finder, NP.  She reported a difficult time adjusting to PPM.  She had recently fallen on the treadmill and her husband had it removed.  [name] to try to walk outside as the weather gets warmer.  Dyspnea resolved after resuming metoprolol  50 mg at bedtime.  Home BP 110-125/50s-60s, and HR in the 60s and 70s.  Recent weight loss, working on increasing protein with boost in the morning and trying to eat even when not hungry.  Weight is stable with some sporadic weight gain.  She takes extra Bumex  on Monday Wednesday and Friday.  Mild bilateral ankle edema that is overall unchanged.  Based on lab results completed that day, she was advised to take Bumex  2 mg daily with no afternoon dosing for fear of over diuresing.  She was advised to take metolazone  as needed for weight gain of 5 pounds in 1 week, do not take more than once per week.  She was later changed to Lopressor  50 mg twice daily by Dr. Kitty.  Admission 6/28-03/27/24 for DOE, edema, possible pneumonia which was later ruled out. Excellent diuresis with IV Bumex  and metolazone  x 1. Advised to take Bumex  2 mg twice daily for 3 days then return to once daily dosing. One episode of AF RVR in the 130s with improvement with IV diltiazem  x 1.    History of Present Illness Discussed  the use of AI scribe software for clinical note transcription with the patient, who gave verbal consent to proceed.  History of Present Illness Catherine [name] is a very pleasant 89 year old female here today with multiple complaints, including leg swelling. She has experienced palpitations and generally has not felt well since paceaker  placement in December. Palpitations often occur in the mornings and can persist. She discontinued metoprolol  and bisoprolol  due to side effects including shortness of breath, wheezing, and nausea. She uses a breathing machine and inhaler for shortness of breath. Recently concerned about having pneumonia, with congestion that has persisted. She is concerned about bilateral leg swelling and skin tears. Swelling is often improved in the mornings but worsens throughout the day. She wears support hose but cannot use them on her left leg due to an open wound. Increased Bumex  dosing x 3 days did not improve swelling.  She has significant pain in her back between her shoulder blades.  Occasional chest pain. No dyspnea, orthopnea, PND, presyncope activity.   ROS: See HPI  Studies Reviewed       No results found for: LIPOA  Risk Assessment/Calculations  CHA2DS2-VASc Score = 6   This indicates a 9.7% annual risk of stroke. The patient's score is based upon: CHF History: 1 HTN History: 1 Diabetes History: 0 Stroke History: 0 Vascular Disease History: 1 Age Score: 2 Gender Score: 1            Physical Exam VS:  BP 102/60   Pulse 81   Ht 5' 5 (1.651 m)   Wt 115 lb (52.2 kg)   SpO2 97%   BMI 19.14 kg/m    Wt Readings from Last 3 Encounters:  04/09/24 115 lb (52.2 kg)  04/04/24 117 lb (53.1 kg)  04/02/24 119 lb 8 oz (54.2 kg)    GEN: Well nourished, well developed in no acute distress NECK: No JVD; No carotid bruits CARDIAC: Irregular RR, no murmurs, rubs, gallops RESPIRATORY:  Clear to auscultation without rales, wheezing or rhonchi  ABDOMEN: Soft, non-tender, non-distended EXTREMITIES:  No edema; No deformity    Assessment & Plan Chronic HFpEF Chronic leg swelling   Bilateral lower extremity swelling that does not improve with increased diuresis. Slight improvement with compression stockings, however she has an open wound on left lower leg and should avoid compression  stockings on that leg.  Echo 11/01/2023 reveals normal LVEF, moderate LVH, indeterminate diastolic function. She follows low sodium diet. Admits appetite is not good. Encouraged her to maintain regular hydration and nutrition to maintain leg strength and muscle tone. Continue Bumex  2 mg daily. Will have her take metolazone  5 mg once weekly, 30 minutes before Bumex . Refer to Dr. Harden for evaluation of leg swelling and potential venous insufficiency. Advise elevation of legs above heart level when sitting and continued compression, avoiding left leg compression due to open wound.  Knee function stable on labs completed 04/02/2024.  We will repeat in 1 month due to the addition of weekly metolazone .  Continue low-dose Entresto  91/2 of 24-26 mg tab) twice daily.  Atrial fibrillation on chronic anticoagulation Occasional episodes of tachy palpitations. Intolerant of multiple AV nodal blocking agents. She denies bleeding concerns.  Continue Eliquis  2.5 mg twice daily which is appropriate dose (age, weight) for stroke prevention for CHA2DS2-VASc score of 6. See EP for management of HR, NSVT as noted below.   Sinus node dysfunction Pacemaker Insertion NSVT   Notes palpitations and tachycardia most notable in the mornings.  Episodes of VT noted on remote transmission 6/30 and 7/1, > 20 beats. She has been unable to tolerate multiple beta-blockers in varying doses and does not want to add additional medications. Most recently, had wheezing on bisoprolol . She wants to see Dr. Kennyth for evaluation of pacemaker. We reached out to EP scheduling team for soon follow-up.   S/p MVR/Aortic stenosis Echo 11/29/2023 reveals mitral valve prosthesis working well, mild to moderate leaking of the tricuspid valve, mild to moderate AS with mean gradient 15.3 mmHg, VTI measuring 0.85 cm.  No symptoms to suggest worsening valve function. Will continue to monitor clinically at this time.  CAD She denies chest pain, dyspnea, or other  symptoms concerning for angina.  No indication for further ischemic evaluation at this time.  Back Pain   Chronic back pain possibly related to previous surgeries and nerve pain. Recommend follow-up with specialist for management of pain.         Dispo: Keep your August appointment with Dr. Raford  Signed, Rosaline Bane, NP-C

## 2024-04-02 NOTE — Progress Notes (Signed)
 Established Patient Office Visit  Subjective   Patient ID: Catherine [name], female    DOB: 12-31-1934  Age: 89 y.o. MRN: [id number]  Chief Complaint  Patient presents with   Hospitalization Follow-up    HPI   Catherine [name] has chronic problems including chronic heart failure with preserved ejection fraction, chronic atrial fibrillation, history of CAD with prior CABG, sick sinus syndrome, hypertension, history of mitral valve replacement, chronic kidney disease, history of kidney stones, chronic insomnia, chronic back pain with prior back surgeries.  She has battled with chronic lower extremity edema for quite some time which has been managed per cardiology.  Recent hospital admission June 28 through July 1 for acute on chronic heart failure with preserved ejection fraction.  There was some question initially of community-acquired pneumonia but felt to be less likely.  She presented with increased dyspnea and increasing lower extremity edema.  She received IV Bumex  and metolazone  with good diuresis and prompt improvement in her dyspnea.  Was transition to oral Bumex  2 mg twice daily for 3 days and then her usual regimen of 2 mg once daily.  Chest x-ray apparently showed bilateral consolidations with initial concern for pneumonia.  Was treated initially with empiric ceftriaxone  plus azithromycin .  COVID testing and RSV testing negative.  Antibiotics were discontinued at discharge.  History of atrial fibrillation and had 1 episode of rapid ventricular response with rate in 130s and patient was asymptomatic.  She received 1 dose of IV Cardizem .  Currently taking bisoprolol  but apparently only half of a 5 mg tablet.  She does remain on Eliquis  2.5 mg twice daily.  Very debilitated and has become more debilitated over time.  Apparently referral was made for some outpatient PT.  She had initially some mild hyperkalemia 5.4 and decreased to 3.1 at discharge.  She does take potassium replacement with  40 mill equivalents twice daily.  Home weights have been around 117 pounds and stable since her hospital discharge.  She is trying to watch her sodium intake.  She is doing morning weights regularly.  Patient states that she had pacemaker back in December and has felt poorly since then.  Past Medical History:  Diagnosis Date   Acute on chronic diastolic heart failure (HCC)    AKI (acute kidney injury) (HCC) 06/22/2022   Aortic stenosis, mild 07/26/2017   Arthritis    Asthma    last attack 02/2015   Atrial fibrillation, chronic (HCC)    Cellulitis of left lower extremity 08/17/2016   Ulcer associated with severe venous insufficiency   Chronic anticoagulation    Chronic diastolic CHF (congestive heart failure) (HCC)    Chronic kidney disease    RIGHT MANY KIDNEY INFECTIONS AND STONES   COPD (chronic obstructive pulmonary disease) (HCC)    Coronary artery disease    Dizziness    H/O: rheumatic fever    Heart murmur    Hypertension    PONV (postoperative nausea and vomiting)    ' SOMETIMES', BUT NOT ALWAYS   Primary hypertension    RHEUMATIC MITRAL STENOSIS 01/05/2008   Qualifier: Diagnosis of   By: Neysa MD, Clinton D        S/P Maze operation for atrial fibrillation 10/14/2016   Complete bilateral atrial lesion set using cryothermy and bipolar radiofrequency ablation - atrial appendage was not treated due to previous surgical procedure (open mitral commissurotomy)   S/P mitral valve replacement with bioprosthetic valve 10/14/2016   29 mm Medtronic Mosaic porcine bioprosthetic tissue valve  Sciatica 08/29/2023   Tricuspid regurgitation 07/14/2021   UTI (urinary tract infection)    Valvular heart disease    Has mitral stenosis with prior mitral commissurotomy in 1970   Past Surgical History:  Procedure Laterality Date   ABDOMINAL HYSTERECTOMY  1983   endometriosis   APPENDECTOMY     BACK SURGERY     neurosurgery x2   CARDIAC CATHETERIZATION     CARDIAC CATHETERIZATION N/A  08/03/2016   Procedure: Right/Left Heart Cath and Coronary Angiography;  Surgeon: Peter M Swaziland, MD;  Location: [facility] INVASIVE CV LAB;  Service: Cardiovascular;  Laterality: N/A;   cataract surg     CHOLECYSTECTOMY N/A 05/30/2015   Procedure: LAPAROSCOPIC CHOLECYSTECTOMY WITH INTRAOPERATIVE CHOLANGIOGRAM;  Surgeon: Morene Olives, MD;  Location: [facility] OR;  Service: General;  Laterality: N/A;   COLONOSCOPY     CORONARY ARTERY BYPASS GRAFT N/A 10/14/2016   Procedure: CORONARY ARTERY BYPASS GRAFTING (CABG);  Surgeon: Sudie VEAR Laine, MD;  Location: [facility] OR;  Service: Open Heart Surgery;  Laterality: N/A;   EYE SURGERY     MAZE N/A 10/14/2016   Procedure: MAZE;  Surgeon: Sudie VEAR Laine, MD;  Location: [facility] OR;  Service: Open Heart Surgery;  Laterality: N/A;   MITRAL VALVE REPLACEMENT N/A 10/14/2016   Procedure: REDO MITRAL VALVE REPLACEMENT (MVR);  Surgeon: Sudie VEAR Laine, MD;  Location: [facility] OR;  Service: Open Heart Surgery;  Laterality: N/A;   MITRAL VALVE SURGERY Left 1970   Open mitral commissurotomy via left thoracotomy approach   PACEMAKER IMPLANT N/A 09/22/2023   Procedure: PACEMAKER IMPLANT;  Surgeon: Kennyth Chew, MD;  Location: [facility] INVASIVE CV LAB;  Service: Cardiovascular;  Laterality: N/A;   TEE WITHOUT CARDIOVERSION N/A 07/05/2016   Procedure: TRANSESOPHAGEAL ECHOCARDIOGRAM (TEE);  Surgeon: Annabella Scarce, MD;  Location: [facility] ENDOSCOPY;  Service: Cardiovascular;  Laterality: N/A;   TEE WITHOUT CARDIOVERSION N/A 10/14/2016   Procedure: TRANSESOPHAGEAL ECHOCARDIOGRAM (TEE);  Surgeon: Sudie VEAR Laine, MD;  Location: [facility] OR;  Service: Open Heart Surgery;  Laterality: N/A;    reports that she quit smoking about 48 years ago. Her smoking use included cigarettes. She started smoking about 63 years ago. She has a 15 pack-year smoking history. She has never used smokeless tobacco. She reports that she does not drink alcohol and does not use drugs. family history includes Leukemia in her  father. Allergies  Allergen Reactions   Aldactone  [Spironolactone ] Other (See Comments)    Dyspnea    Amoxil  [Amoxicillin ] Palpitations and Other (See Comments)    Tachycardia   Zebeta  [Bisoprolol ] Shortness Of Breath   Calan  [Verapamil ] Other (See Comments)    Myalgias    Crestor  [Rosuvastatin ] Other (See Comments)    Myalgias    Flovent  [Fluticasone  Propionate] Other (See Comments)    Leg cramps   Livalo  [Pitavastatin ] Other (See Comments)    Myalgias    Lyrica [Pregabalin] Swelling   Neurontin [Gabapentin] Swelling   Norvasc  [Amlodipine ] Swelling    Low extremity edema   Zetia  [Ezetimibe ] Other (See Comments)    Myalgias   Zocor  [Simvastatin ] Other (See Comments)    Myalgias    Lotensin [Benazepril] Cough   Polytrim  [Polymyxin B -Trimethoprim ] Swelling     Review of Systems  Constitutional:  Positive for malaise/fatigue. Negative for chills and fever.  Respiratory:  Positive for shortness of breath. Negative for cough and wheezing.   Cardiovascular:  Positive for leg swelling. Negative for chest pain.  Gastrointestinal:  Negative for abdominal pain.  Genitourinary:  Negative for dysuria.  Musculoskeletal:  Positive for back pain.      Objective:     BP (!) 92/56 (BP Location: Left Arm, Patient Position: Sitting, Cuff Size: Normal)   Pulse 81   Temp 98.2 F (36.8 C) (Oral)   Wt 119 lb 8 oz (54.2 kg)   SpO2 95%   BMI 19.89 kg/m  BP Readings from Last 3 Encounters:  04/02/24 (!) 92/56  03/27/24 (!) 95/56  02/22/24 105/65   Wt Readings from Last 3 Encounters:  04/02/24 119 lb 8 oz (54.2 kg)  03/26/24 121 lb 14.6 oz (55.3 kg)  02/22/24 121 lb 9.6 oz (55.2 kg)      Physical Exam Vitals reviewed.  Constitutional:      Comments: Alert somewhat frail-appearing 89 year old female in no distress  Cardiovascular:     Rate and Rhythm: Normal rate and regular rhythm.  Pulmonary:     Comments: No rales.  No wheezes. Musculoskeletal:     Comments: Has trace  pitting edema feet, ankles, lower legs bilaterally  Neurological:     General: No focal deficit present.      No results found for any visits on 04/02/24.  Last CBC Lab Results  Component Value Date   WBC 7.7 03/27/2024   HGB 11.3 (L) 03/27/2024   HCT 33.3 (L) 03/27/2024   MCV 91.5 03/27/2024   MCH 31.0 03/27/2024   RDW 13.0 03/27/2024   PLT 155 03/27/2024   Last metabolic panel Lab Results  Component Value Date   GLUCOSE 100 (H) 03/27/2024   NA 139 03/27/2024   K 3.1 (L) 03/27/2024   CL 103 03/27/2024   CO2 29 03/27/2024   BUN 18 03/27/2024   CREATININE 1.12 (H) 03/27/2024   GFRNONAA 48 (L) 03/27/2024   CALCIUM  8.7 (L) 03/27/2024   PHOS 4.0 09/11/2023   PROT 4.9 (L) 03/25/2024   ALBUMIN  2.3 (L) 03/25/2024   LABGLOB 1.9 06/14/2022   AGRATIO 2.3 (H) 06/14/2022   BILITOT 1.1 03/25/2024   ALKPHOS 80 03/25/2024   AST 31 03/25/2024   ALT 11 03/25/2024   ANIONGAP 7 03/27/2024   Last lipids Lab Results  Component Value Date   CHOL 240 (H) 08/31/2023   HDL 41.90 08/31/2023   LDLCALC 157 (H) 08/31/2023   LDLDIRECT 68.0 09/24/2019   TRIG 208.0 (H) 08/31/2023   CHOLHDL 6 08/31/2023   Last hemoglobin A1c Lab Results  Component Value Date   HGBA1C 5.3 10/12/2016   Last thyroid  functions Lab Results  Component Value Date   TSH 4.180 03/24/2024      The ASCVD Risk score (Arnett DK, et al., 2019) failed to calculate for the following reasons:   The 2019 ASCVD risk score is only valid for ages 89 to 23    Assessment & Plan:   #1 recent acute exacerbation of chronic heart failure with preserved ejection fraction.  Patient diuresed well with IV Bumex .  Currently back on daily regimen of 2 mg of Bumex  daily.  Home weights have been stable --117 pounds.  Continue daily weights.  Continue current diuretic regimen.  Recheck basic metabolic panel.  Continue low-sodium diet.  #2 hypokalemia at discharge with potassium 3.1.  Recheck basic metabolic panel.  Continue  daily supplement with potassium and adjust dose if necessary  #3 history of atrial fibrillation.  Had 1 episode of rapid ventricular response during hospitalization.  She will discuss with cardiology regarding bisoprolol  versus other medication.  She had intolerance of metoprolol  previously Continue Eliquis  2.5 mg twice  daily  #4 generalized weakness.  Patient had referral placed for outpatient  rehab but is waiting to hear back regarding that  #5 chronic back pain followed currently by orthopedics.  This limits her activity greatly as she has fairly severe lumbar stenosis  #6 history of hyperlipidemia with statin intolerance as well as Zetia  intolerance   No follow-ups on file.    Wolm Scarlet, MD

## 2024-04-02 NOTE — Telephone Encounter (Signed)
 Pt got her Potassium checked today in the lab.  She is wondering if she is supposed to continue taking her Potassium at home or is she supposed to wait until her lab results come back before taking anymore?  Please give patient a call back to let her know.

## 2024-04-03 ENCOUNTER — Ambulatory Visit: Payer: Self-pay | Admitting: Family Medicine

## 2024-04-03 ENCOUNTER — Other Ambulatory Visit: Payer: Self-pay | Admitting: Family Medicine

## 2024-04-03 LAB — BASIC METABOLIC PANEL WITH GFR
BUN: 37 mg/dL — ABNORMAL HIGH (ref 6–23)
CO2: 30 meq/L (ref 19–32)
Calcium: 9 mg/dL (ref 8.4–10.5)
Chloride: 101 meq/L (ref 96–112)
Creatinine, Ser: 1.37 mg/dL — ABNORMAL HIGH (ref 0.40–1.20)
GFR: 34.91 mL/min — ABNORMAL LOW (ref >60.00)
Glucose, Bld: 91 mg/dL (ref 70–99)
Potassium: 5 meq/L (ref 3.5–5.1)
Sodium: 137 meq/L (ref 135–145)

## 2024-04-03 MED ORDER — FLUCONAZOLE 150 MG PO TABS: 150.0000 mg | ORAL_TABLET | Freq: Once | ORAL | 0 refills | Status: AC

## 2024-04-03 NOTE — Telephone Encounter (Signed)
Rx sent and patient aware 

## 2024-04-03 NOTE — Telephone Encounter (Signed)
 Patient informed of the message and voiced understanding. Patient inquired of Fluconazole  can be called in as she states she has a yeast infection.

## 2024-04-04 ENCOUNTER — Other Ambulatory Visit: Payer: Self-pay

## 2024-04-04 NOTE — Transitions of Care (Post Inpatient/ED Visit) (Unsigned)
 Transition of Care week 2  Visit Note  04/04/2024  Name: Catherine [name] MRN: [id number]          DOB: [birth year]  Situation: Patient enrolled in [facility] 30-day program. Visit completed with patient by telephone.   Background: recent admission for respiratory difficulties and fluid overload  Initial Transition Care Management Follow-up Telephone Call    Past Medical History:  Diagnosis Date   Acute on chronic diastolic heart failure (HCC)    AKI (acute kidney injury) (HCC) 06/22/2022   Aortic stenosis, mild 07/26/2017   Arthritis    Asthma    last attack 02/2015   Atrial fibrillation, chronic (HCC)    Cellulitis of left lower extremity 08/17/2016   Ulcer associated with severe venous insufficiency   Chronic anticoagulation    Chronic diastolic CHF (congestive heart failure) (HCC)    Chronic kidney disease    RIGHT MANY KIDNEY INFECTIONS AND STONES   COPD (chronic obstructive pulmonary disease) (HCC)    Coronary artery disease    Dizziness    H/O: rheumatic fever    Heart murmur    Hypertension    PONV (postoperative nausea and vomiting)    ' SOMETIMES', BUT NOT ALWAYS   Primary hypertension    RHEUMATIC MITRAL STENOSIS 01/05/2008   Qualifier: Diagnosis of   By: Neysa MD, Clinton D        S/P Maze operation for atrial fibrillation 10/14/2016   Complete bilateral atrial lesion set using cryothermy and bipolar radiofrequency ablation - atrial appendage was not treated due to previous surgical procedure (open mitral commissurotomy)   S/P mitral valve replacement with bioprosthetic valve 10/14/2016   29 mm Medtronic Mosaic porcine bioprosthetic tissue valve   Sciatica 08/29/2023   Tricuspid regurgitation 07/14/2021   UTI (urinary tract infection)    Valvular heart disease    Has mitral stenosis with prior mitral commissurotomy in 1970    Assessment: Patient reports she is concerned about the swelling in her legs. Reports no weight gain. Wearing compression hose, elevating  legs and following her low salt diet.  Continues to have low BP.  Patient Reported Symptoms: Cognitive Cognitive Status: Able to follow simple commands, Alert and oriented to person, place, and time, Normal speech and language skills      Neurological Neurological Review of Symptoms: No symptoms reported    HEENT HEENT Symptoms Reported: No symptoms reported      Cardiovascular Cardiovascular Symptoms Reported: Swelling in legs or feet, Other: Other Cardiovascular Symptoms: Reports low BP at home, Reports severe swelling in legs. Denies weight gain. Does patient have uncontrolled Hypertension?: No Cardiovascular Management Strategies: Medical device, Medication therapy, Weight management Do You Have a Working Readable Scale?: Yes Weight: 117 lb (53.1 kg) (home monitoring) Cardiovascular Self-Management Outcome: 3 (uncertain) Cardiovascular Comment: Patient is very worried about her swelling in her legs.  Respiratory Respiratory Symptoms Reported: Shortness of breath Other Respiratory Symptoms: Reports shortness of breath comes and goes    Endocrine Endocrine Symptoms Reported: No symptoms reported    Gastrointestinal Gastrointestinal Symptoms Reported: No symptoms reported      Genitourinary Genitourinary Symptoms Reported: Itching or irritation, Pain/burning with urination, Frequency Additional Genitourinary Details: Reports that she was getting a yeast infection. Took Difflucan yesterday and has decrease itching at this time. Genitourinary Management Strategies: Medication therapy Genitourinary Self-Management Outcome: 4 (good)  Integumentary Integumentary Symptoms Reported: No symptoms reported    Musculoskeletal Musculoskelatal Symptoms Reviewed: No symptoms reported        Psychosocial Psychosocial  Symptoms Reported: No symptoms reported          Today's Vitals   04/04/24 1218  BP: (!) 103/57  Pulse: 89  Weight: 117 lb (53.1 kg)    Medications Reviewed Today      Reviewed by Rumalda Alan PENNER, RN (Registered Nurse) on 04/04/24 at 1144  Med List Status: <None>   Medication Order Taking? Sig Documenting Provider Last Dose Status Informant  acetaminophen  (TYLENOL ) 500 MG tablet [id number] Yes Take 500 mg by mouth 2 (two) times daily. [provider]  Active Self, Pharmacy Records           Med Note (COFFELL, JON HERO   Sun Mar 25, 2024 11:26 AM)    albuterol  (PROVENTIL ) (2.5 MG/3ML) 0.083% nebulizer solution [id number] Yes Take 3 mLs (2.5 mg total) by nebulization every 6 (six) hours as needed (during COPD Exacerbation). Hope Almarie ORN, NP  Active Self, Pharmacy Records  albuterol  (VENTOLIN  HFA) 108 [phone] Base) MCG/ACT inhaler [id number] Yes INHALE 2 PUFFS INTO THE LUNGS EVERY 4 HOURS AS NEEDED FOR AHEEZING OR SHORTNESS OF BREATH Parrett, Madelin RAMAN, NP  Active Self, Pharmacy Records  apixaban  (ELIQUIS ) 2.5 MG TABS tablet [id number] Yes TAKE ONE TABLET TWICE DAILY (START 09/18/23 OR AS DIRECTED) Micheal Wolm ORN, MD  Active Self, Pharmacy Records  bisoprolol  (ZEBETA ) 5 MG tablet [id number] Yes Take 2.5 mg by mouth daily. [provider]  Active   bumetanide  (BUMEX ) 2 MG tablet [id number] Yes Take 1 tablet (2 mg total) by mouth 2 (two) times daily for 3 days, THEN 1 tablet (2 mg total) daily. Arlon Carliss ORN, DO  Active   cholecalciferol (VITAMIN D3) 25 MCG (1000 UNIT) tablet [id number] Yes Take 1,000 Units by mouth daily. [provider]  Active Self, Pharmacy Records  diazepam  (VALIUM ) 5 MG tablet [id number] Yes TAKE ONE TABLET BY MOUTH AT BEDTIME Micheal Wolm ORN, MD  Active Self, Pharmacy Records  estradiol  (ESTRACE ) 0.1 MG/GM vaginal cream [id number] Yes Place 1 Applicatorful vaginally See admin instructions. Apply a small amount vaginally three nights a week on Monday, Wednesday and Friday. [provider]  Active Self, Pharmacy Records  fluticasone  (FLONASE ) 50 MCG/ACT nasal spray [id number] Yes Place 1 spray into both nostrils  daily as needed for allergies or rhinitis. Shellia Oh, MD  Active Self, Pharmacy Records  HYDROcodone -acetaminophen  (NORCO/VICODIN) 5-325 MG tablet [id number] Yes TAKE ONE TABLET EVERY 8 HOURS AS NEEDED FOR PAIN Burchette, Wolm ORN, MD  Active   metolazone  (ZAROXOLYN ) 5 MG tablet [id number] Yes Take 1 tablet (5 mg total) by mouth as directed. TAKE ONE TABLET AS NEEDED ONCE A WEEK 30 MINUTES BEFORE BUMEX  DOSE. AS NEEDED ONLY FOR WEIGHT GAIN 2LBS OVERNIGHT OR 5 LBS IN ONE WEEK Walker, Caitlin S, NP  Active Self, Pharmacy Records  mupirocin  ointment (BACTROBAN ) 2 % [id number] Yes APPLY TOPICALLY ONCE A DAY Burchette, Wolm ORN, MD  Active Self, Pharmacy Records  potassium chloride  (KLOR-CON ) 10 MEQ tablet [id number] Yes Take 40 mEq by mouth See admin instructions. Take 4 tablets (40mEq) twice daily, after breakfast and after dinner. [provider]  Active Self, Pharmacy Records  sacubitril -valsartan  (ENTRESTO ) 24-26 MG [id number] Yes Take 1 tablet by mouth 2 (two) times daily. Raford Riggs, MD  Active Self, Pharmacy Records           Med Note (COFFELL, JON HERO   Sun Mar 25, 2024 11:31 AM) No fill history found on dispense report in 2025. Pt states she is still taking.  Recommendation:   Continue Current Plan of Care Call MD for weight gain or any other changes in your health.   Follow Up Plan:   I have sent an in basket message to cardiology for recommendation and notifications.  Telephone follow up appointment date/time:  ***  SIG ***

## 2024-04-05 ENCOUNTER — Telehealth: Payer: Self-pay | Admitting: Cardiovascular Disease

## 2024-04-05 NOTE — Telephone Encounter (Signed)
 Calling to ask that you look at her message she you, and then reach out to the patient because she will be on vacation tomorrow at 12. Please advise

## 2024-04-05 NOTE — Patient Instructions (Signed)
 Visit Information  Thank you for taking time to visit with me today. Please don't hesitate to contact me if I can be of assistance to you before our next scheduled telephone appointment.  Our next appointment is by telephone on 04/11/2024  at 130pm  Following is a copy of your care plan:   Goals Addressed             This Visit's Progress    [facility] Transitions of Care (TOC) Care Plan       Problems:  Recent Hospitalization for treatment of Acute hypoxemic respiratory failure.  04/04/2024  Patient reports that she continues to have some shortness of breath but she is much improve.   Swelling:   04/04/2024  Patient reports her biggest concern is her selling. Reports that her legs look like they will pop if she stuck a needle in them. Reports weight is unchanged and is ranging from 114-117 pounds. Today 117. Patient reports that she is following her low salt diet. Reports she is wearing her compression hose and keeping her legs elevated.  Reports that she restarted her bisoprolol  at a half dose because her heart rate when up when she got home.  She reports that she developed a yeast infection but was able to take a Diflucan  yesterday and feels some better today. Reports she has already had her hospital follow up with PCP. Patient reports that her BP range has been 76-103/37-57. Reports BP is always low for her.   Update: 04/05/2024   no response by Kaitlin Walker  via inbasket, Called cardiologist office and Ms. Vannie will be back tomorrow. Office will call patient directly. Patient informed. Patient reports no energy today and feels weak. Did not take her Bisprolol today. Weight today of 117 pounds   Goal:  Over the next 30 days, the patient will not experience hospital readmission  Interventions:  Transitions of Care: Doctor Visits  - discussed the importance of doctor visits. Reviewed PCP follow up  Encouraged patient to continue to weigh daily, follow her low salt diet, elevate legs and  wear her compression hose In basket message sent to cardiology about current condition and patients concerns. Cardiology Follow up is not planned until 04/09/2024 04/05/2024 call to cardiology office - they will call the patient directly tomorrow.  Reviewed current weight and BP readings. Continue to take medications as prescribed.  Provided a listening ear and offered support and reassurance.   Patient Self Care Activities:  Attend all scheduled provider appointments Call pharmacy for medication refills 3-7 days in advance of running out of medications Call provider office for new concerns or questions  Notify RN Care Manager of TOC call rescheduling needs Participate in Transition of Care Program/Attend TOC scheduled calls Perform all self care activities independently  Perform IADL's (shopping, preparing meals, housekeeping, managing finances) independently Take medications as prescribed   Continue to weigh daily and follow your low salt diet. Continues to elevate your legs and wear your compression hose.  Call MD for weight gain.   Plan:   Telephone follow up appointment with care management team member scheduled for: 04/11/2024 with Bing Edison RN [facility] team)        Patient verbalizes understanding of instructions and care plan provided today and agrees to view in MyChart. Active MyChart status and patient understanding of how to access instructions and care plan via MyChart confirmed with patient.     Telephone follow up appointment with care management team member scheduled for:04/11/2024 at  1:30 pm  Please call the care guide team at [phone] if you need to cancel or reschedule your appointment.   Please call the Suicide and Crisis Lifeline: 988 call the [place]  National Suicide Prevention Lifeline: 719-008-3110 or TTY: 682-206-5692 TTY 4182195155) to talk to a trained counselor call 1-800-273-TALK (toll free, 24 hour hotline) call 911 if you are experiencing a  Mental Health or Behavioral Health Crisis or need someone to talk to.  Alan Ee, RN, BSN, CEN [employer]- Transition of Care Team.  [facility] [phone]

## 2024-04-06 MED ORDER — SACUBITRIL-VALSARTAN 24-26 MG PO TABS: ORAL_TABLET | ORAL | Status: DC

## 2024-04-06 NOTE — Addendum Note (Signed)
 Addended by: FREDIRICK BEAU B on: 04/06/2024 12:22 PM   Modules accepted: Orders

## 2024-04-06 NOTE — Telephone Encounter (Signed)
 Advised patient, verbalized understanding

## 2024-04-06 NOTE — Telephone Encounter (Signed)
 Per Population Health TOC team   I did a transition of care call on her today (04/04/24) and she is complaining of severe swelling in her legs.  States they look like they will pop. She is weighing daily and today's weight is 117 pounds. Reports following low salt diet and wearing compression hose.  She restarted her Bisoprolol  at 2.5 mg daily because her heart rate when up after discharge.  Continues to have low BP.  She is very concerned about the swelling.    I wanted to see if she should be taking the Bisoprolol  or not?  It was not on her AVS after discharge.  Sweet sweet lady who is very concern.  Admitted 6/28-03/27/2024 with acute on chronic HFpEF on discharge recommended for Bumex  2 mg twice daily x 3 days then 2 mg daily.  1 episode of A-fib with RVR.  She was recommended to discuss with cardiologist outpatient whether to continue bisoprolol .  Of note multiple previous phone calls where she did not tolerate metoprolol  tartrate nor metoprolol  succinate with various reported side effects.  She had contacted the office/25/25 regarding shortness of breath which she attributed to bisoprolol  after 1 dose.  She saw PCP 04/02/2024 with reported home weight stable 117 pounds. BP 92/56, HR 82. K5.0, creatinine 1.37, GFR 34.91.  She has a cardiology visit upcoming 04/09/2024.  Recommendations: Split Entresto  to take half tablet BID due to relative hypotension As heart rate controlled <100bpm and relative hypotension would remain off Bisoprolol  at this time. Increase Bumex  to 2mg  BID until her OV Monday Elevated legs, wear compression stockings, continue low salt diet If worsened LE edema or shortness of breath over the weekend, would recommend ED evaluation.  Zamarian Scarano S Vaniyah Lansky, NP

## 2024-04-09 ENCOUNTER — Ambulatory Visit (HOSPITAL_BASED_OUTPATIENT_CLINIC_OR_DEPARTMENT_OTHER): Admitting: Nurse Practitioner

## 2024-04-09 ENCOUNTER — Encounter (HOSPITAL_BASED_OUTPATIENT_CLINIC_OR_DEPARTMENT_OTHER): Payer: Self-pay | Admitting: Nurse Practitioner

## 2024-04-09 VITALS — BP 102/60 | HR 81 | Ht 65.0 in | Wt 115.0 lb

## 2024-04-09 DIAGNOSIS — Z953 Presence of xenogenic heart valve: Secondary | ICD-10-CM

## 2024-04-09 DIAGNOSIS — G8929 Other chronic pain: Secondary | ICD-10-CM

## 2024-04-09 DIAGNOSIS — I38 Endocarditis, valve unspecified: Secondary | ICD-10-CM | POA: Diagnosis not present

## 2024-04-09 DIAGNOSIS — Z950 Presence of cardiac pacemaker: Secondary | ICD-10-CM

## 2024-04-09 DIAGNOSIS — S81802A Unspecified open wound, left lower leg, initial encounter: Secondary | ICD-10-CM

## 2024-04-09 DIAGNOSIS — S81802D Unspecified open wound, left lower leg, subsequent encounter: Secondary | ICD-10-CM

## 2024-04-09 DIAGNOSIS — M5489 Other dorsalgia: Secondary | ICD-10-CM | POA: Diagnosis not present

## 2024-04-09 DIAGNOSIS — D6859 Other primary thrombophilia: Secondary | ICD-10-CM

## 2024-04-09 DIAGNOSIS — I4819 Other persistent atrial fibrillation: Secondary | ICD-10-CM | POA: Diagnosis not present

## 2024-04-09 DIAGNOSIS — I5032 Chronic diastolic (congestive) heart failure: Secondary | ICD-10-CM | POA: Diagnosis not present

## 2024-04-09 DIAGNOSIS — I2510 Atherosclerotic heart disease of native coronary artery without angina pectoris: Secondary | ICD-10-CM | POA: Diagnosis not present

## 2024-04-09 DIAGNOSIS — M7989 Other specified soft tissue disorders: Secondary | ICD-10-CM

## 2024-04-09 DIAGNOSIS — E785 Hyperlipidemia, unspecified: Secondary | ICD-10-CM | POA: Diagnosis not present

## 2024-04-09 DIAGNOSIS — I495 Sick sinus syndrome: Secondary | ICD-10-CM

## 2024-04-09 LAB — LIPID PANEL
Chol/HDL Ratio: 5.4 ratio — ABNORMAL HIGH (ref 0.0–4.4)
Cholesterol, Total: 227 mg/dL — ABNORMAL HIGH (ref 100–199)
HDL: 42 mg/dL (ref >39)
LDL Chol Calc (NIH): 127 mg/dL — ABNORMAL HIGH (ref 0–99)
Triglycerides: 328 mg/dL — ABNORMAL HIGH (ref 0–149)
VLDL Cholesterol Cal: 58 mg/dL — ABNORMAL HIGH (ref 5–40)

## 2024-04-09 MED ORDER — BUMETANIDE 2 MG PO TABS: 2.0000 mg | ORAL_TABLET | Freq: Every day | ORAL | Status: DC

## 2024-04-09 MED ORDER — METOLAZONE 5 MG PO TABS: ORAL_TABLET | ORAL | Status: DC

## 2024-04-09 NOTE — Patient Instructions (Signed)
 Medication Instructions:   Continue Entresto  one half (0.5) tablet (12-13 mg) twice daily.   Take Metolazone  one (1) tablet ( 5 mg) weekly.   Continue Bumex  one (1) tablet by mouth ( 2 mg ) daily.   *If you need a refill on your cardiac medications before your next appointment, please call your pharmacy*  Lab Work:  Please get lab work on the day of your Appointment with Dr. Kennyth on or around August 5, no fasting. Patient given paperwork today.   If you have labs (blood work) drawn today and your tests are completely normal, you will receive your results only by: MyChart Message (if you have MyChart) OR A paper copy in the mail If you have any lab test that is abnormal or we need to change your treatment, we will call you to review the results.  Testing/Procedures:  None ordered.  Follow-Up: At [facility], you and your health needs are our priority.  As part of our continuing mission to provide you with exceptional heart care, our providers are all part of one team.  This team includes your primary Cardiologist (physician) and Advanced Practice Providers or APPs (Physician Assistants and Nurse Practitioners) who all work together to provide you with the care you need, when you need it.  Your next appointment:   1 month(s)  Provider:   Annabella Scarce, MD    We recommend signing up for the patient portal called MyChart.  Sign up information is provided on this After Visit Summary.  MyChart is used to connect with patients for Virtual Visits (Telemedicine).  Patients are able to view lab/test results, encounter notes, upcoming appointments, etc.  Non-urgent messages can be sent to your provider as well.   To learn more about what you can do with MyChart, go to [email or web address].   Other Instructions  You have been referred to Dr. Harden for leg swelling.  This office will call  you to schedule appointment.   Please use Polysporin instead of Neosporin for your  skin tear.

## 2024-04-10 ENCOUNTER — Ambulatory Visit: Payer: Self-pay | Admitting: Pharmacist Clinician (PhC)/ Clinical Pharmacy Specialist

## 2024-04-11 ENCOUNTER — Telehealth: Payer: Self-pay | Admitting: Cardiology

## 2024-04-11 ENCOUNTER — Telehealth: Payer: Self-pay

## 2024-04-11 NOTE — Telephone Encounter (Signed)
 Patient reports  she was in hospital with PNA and CHF about month ago.. Patient reports medication was stopped while she was in hospital. Patient  requesting to see  Dr. Kennyth. Patient reports symptoms are rapid heart beat that wakes her up at night. Patient report lightheadedness and dizziness couple times a week. Patient reported most of time she feels like lying down and  has noticed lost of appetite with weight lost. Patient verbalized she saw APP on 7/14 at [facility] but are still have symptoms. Appointment made with Charlies Arthur 7/17 @ 8:50. Patient verbalized an understanding.

## 2024-04-11 NOTE — Telephone Encounter (Signed)
 Patient c/o Palpitations: STAT if patient c/o lightheadedness, shortness of breath, or chest pain  How long have you had palpitations/irregular HR/ Afib? Are you having the symptoms now? A couple of months off and on  Are you currently experiencing lightheadedness, SOB or CP? SOB   Do you have a history of afib (atrial fibrillation) or irregular heart rhythm? Yes, pt has had 2 heart surgery and then pace maker was put in 09/23/2023  Have you checked your BP or HR? (document readings if available): Yesterday 102/60 HR 81  Are you experiencing any other symptoms? No, pt is just very concerned and she stated at her last office visit her medication were adjusted and she was taken off some more since her heart was still beating fast. Pt is wanting to speak with Dr. Shaune nurse regarding concerns. Please advise

## 2024-04-11 NOTE — Progress Notes (Unsigned)
 Cardiology Office Note:  .   Date:  04/11/2024  ID:  Catherine [name], DOB [birth year], MRN [id number] PCP: Micheal Wolm ORN, MD  [facility] Providers Cardiologist:  Annabella Scarce, MD Electrophysiologist:  Fonda Kitty, MD {  History of Present Illness: .   Catherine [name] is a 89 y.o. female w/PMHx of  HTN, HLD, CKD, asthma/COPD Hx of COVID complicated by spontaneous PTX VHD/CAD (s/p MVR (bioprosthetic), CAD s/p CABG, parox Afib s/p MAZE, 2018) Chronic diastolic HF SND w/PPM  She saw Dr. Kitty 12/26/23, struggling with back/sciatica pain, reported episodes where the bottom chamber of her heart beats rapidly for short durations, occurring about four times in the past month. These episodes are sometimes accompanied by brief pain and a sensation of shortness of breath  Alos of transient unilateral visual loss that she wondered might be the metoprolol   Device check with NSVTs 20>20beats 154-156bpm AF <0.1% Metoprolol  increased to 100mg  HS  Seeing cards team a couple times since then, numerous phone notes as well.  Admission 6/28-03/27/24 for DOE, edema, possible pneumonia which was later ruled out. Excellent diuresis with IV Bumex  and metolazone  x 1. Advised to take Bumex  2 mg twice daily for 3 days then return to once daily dosing. One episode of AF RVR in the 130s with improvement with IV diltiazem  x 1.   Her most recent visit 04/09/24 with cards APP, reported a wound on her leftleg unable to wear support stocking on that legt Edema worse as the day goes on Reported palpitations and generally feeling unwell post pacer Some SOB remained Edema reported to have a chronic component Noted on recent remotes further NSVTs episodes, did not want further medication changes and requested to be seen by Dr. Kitty   Today's visit is scheduled for c/o palpitations ROS:    She comes today accompanied by her husband Generally feeling poorly > timeline is ever since her pacemaker was  put in, seems to connect with that Reminisces about how awful the procedure was, terribly painful despite meds, never felt like she was sleepy or numb  Feels weak and tired all of the time, says doesn't have the energy to do anything Skipped a trip to see her new grand baby, just didn't have the energy Nauseous, barely able to eat and losing weight No near syncope or syncope No rest SOB, some DOE She remains edematous, but nearly as much as her June hospitalization w/diastolic HF + palpitations, feels her HR fast, sometimes wakes her  Device information MDT dual chamber PPM implanted 09/22/23  Arrhythmia/AAD hx No AAD AFib (hx of MAZE in 2018) NSVT/VTs (slow noted on her pacer check 20205  Studies Reviewed: SABRA    EKG done today and reviewed by myself:  SVT 110bpm, narrow QRS 74ms and  Reviewed with Dr. Waddell > junctional tachycardia  DEVICE interrogation done today and reviewed by myself Battery and auto lead measurements are good She presents in VS 110bpm rhythm (EGM A and V are simultaneous) + AMS and HVR episodes Some clearly are V>A and others A>V but both have the same V EGM AP 97.7% VP 95.4%   Echo 11/01/23:   1. Left ventricular ejection fraction, by estimation, is 65 to 70%. The  left ventricle has normal function. The left ventricle has no regional  wall motion abnormalities. There is moderate left ventricular hypertrophy.  Left ventricular diastolic  parameters are indeterminate.   2. Right ventricular systolic function is mildly reduced. The right  ventricular  size is mildly enlarged. There is mildly elevated pulmonary  artery systolic pressure. The estimated right ventricular systolic  pressure is 37.8 mmHg.   3. Left atrial size was severely dilated.   4. Right atrial size was moderately dilated.   5. The mitral valve has been repaired/replaced. Trivial mitral valve  regurgitation. The mean mitral valve gradient is 3.6 mmHg. There is a 29  mm Medtronic  Mosaic Porcine bioprosthetic valve present in the mitral  position. Procedure Date: 10/14/2016. Echo   findings are consistent with normal structure and function of the mitral  valve prosthesis.   6. Tricuspid valve regurgitation is mild to moderate.   7. The aortic valve is abnormal. There is moderate calcification of the  aortic valve. Aortic valve regurgitation is not visualized. Mild-Moderate  aortic valve stenosis. Aortic valve area, by VTI measures 0.85 cm. Aortic  valve mean gradient measures  15.3 mmHg. Aortic valve Vmax measures 2.74 m/s. Aortic valve acceleration  time measures 90 msec. DVI 0.48.   8. The inferior vena cava is dilated in size with >50% respiratory  variability, suggesting right atrial pressure of 8 mmHg.      Risk Assessment/Calculations:    Physical Exam:   VS:  There were no vitals taken for this visit.   Wt Readings from Last 3 Encounters:  04/09/24 115 lb (52.2 kg)  04/04/24 117 lb (53.1 kg)  04/02/24 119 lb 8 oz (54.2 kg)    GEN: Well nourished, well developed in no acute distress NECK: No JVD; No carotid bruits CARDIAC: RRR, tachycardicno murmurs, rubs, gallops RESPIRATORY:  CTA b/l without rales, wheezing or rhonchi  ABDOMEN: Soft, non-tender, non-distended EXTREMITIES:  1++ edema; bandage L shin, clean, dry, No deformity   PPM site: is stable, no thinning, fluctuation, tethering, she is quite thin, no erosion  ASSESSMENT AND PLAN: .    Paroxysmal AFib CHA2DS2Vasc is 6, on Eliquis , appropriately dosed <1% burden  NSVT In review today and with Dr. Waddell Think the episodes V>A are likely this same junctional tachycardia she has today  CAD VHD No anginal sounding symptoms She is edematous > advised she take 2 Bumex  tablets (4mg ) daily for 3 days then resume usual dose C/w Dr. Chana  Secondary hypercoagulable state 2/2 AFib  6.  Junctional tachycardia EKGs pre-pacer also note this She reports wheezing with 2 different  BB CCB also listed as an allergy, verapamil  causing myalgia  Dr. Waddell saw her today While we are with her, she is in/out of the junctional tachycardia Based on pacing % fairly low burden of tachycardia Discussed amiodarone  and ablation Dr. Waddell recommends she pursue ablation to avoid further medications They discussed the procedure, potential risks/benefits She would like to proceed Dr. Waddell did not recommend any medication changes as this time  Given a couple dates, she chose the September date wanted her daughter to be in town I advised that if she has any escalation of symptoms, continues to feel worse, has any clinical deterioration to go to the ER  Dr. Waddell discussed that unclear/unlikely that all of her symptoms are 2/2 the junctional tachycardia, though prolonged episodes certainly could make her feel poorly  She would like to transition her care to Dr. Waddell, aware of his upcoming retirement  Dispo: post procedure follow up with Dr. Waddell, sooner if needed TODAY's visit ~  Signed, Charlies Macario Arthur, PA-C

## 2024-04-12 ENCOUNTER — Other Ambulatory Visit: Payer: Self-pay

## 2024-04-12 ENCOUNTER — Ambulatory Visit: Attending: Physician Assistant | Admitting: Physician Assistant

## 2024-04-12 ENCOUNTER — Encounter: Payer: Self-pay | Admitting: Physician Assistant

## 2024-04-12 VITALS — BP 108/62 | HR 75 | Ht 63.0 in | Wt 120.6 lb

## 2024-04-12 DIAGNOSIS — Z952 Presence of prosthetic heart valve: Secondary | ICD-10-CM | POA: Diagnosis not present

## 2024-04-12 DIAGNOSIS — S81802A Unspecified open wound, left lower leg, initial encounter: Secondary | ICD-10-CM | POA: Diagnosis not present

## 2024-04-12 DIAGNOSIS — I4719 Other supraventricular tachycardia: Secondary | ICD-10-CM

## 2024-04-12 DIAGNOSIS — D6869 Other thrombophilia: Secondary | ICD-10-CM | POA: Diagnosis not present

## 2024-04-12 DIAGNOSIS — I10 Essential (primary) hypertension: Secondary | ICD-10-CM | POA: Diagnosis not present

## 2024-04-12 DIAGNOSIS — I480 Paroxysmal atrial fibrillation: Secondary | ICD-10-CM

## 2024-04-12 DIAGNOSIS — I4819 Other persistent atrial fibrillation: Secondary | ICD-10-CM

## 2024-04-12 DIAGNOSIS — I5032 Chronic diastolic (congestive) heart failure: Secondary | ICD-10-CM | POA: Diagnosis not present

## 2024-04-12 DIAGNOSIS — M7989 Other specified soft tissue disorders: Secondary | ICD-10-CM | POA: Diagnosis not present

## 2024-04-12 DIAGNOSIS — Z950 Presence of cardiac pacemaker: Secondary | ICD-10-CM

## 2024-04-12 DIAGNOSIS — I2510 Atherosclerotic heart disease of native coronary artery without angina pectoris: Secondary | ICD-10-CM | POA: Diagnosis not present

## 2024-04-12 LAB — CUP PACEART INCLINIC DEVICE CHECK
Battery Remaining Longevity: 115 mo
Battery Voltage: 3.07 V
Brady Statistic AP VP Percent: 94.1 %
Brady Statistic AP VS Percent: 0.67 %
Brady Statistic AS VP Percent: 1.28 %
Brady Statistic AS VS Percent: 3.95 %
Brady Statistic RA Percent Paced: 97.67 %
Brady Statistic RV Percent Paced: 95.38 %
Date Time Interrogation Session: 20250717175803
Implantable Lead Connection Status: 753985
Implantable Lead Connection Status: 753985
Implantable Lead Implant Date: 20241226
Implantable Lead Implant Date: 20241226
Implantable Lead Location: 753859
Implantable Lead Location: 753860
Implantable Lead Model: 3830
Implantable Lead Model: 4574
Implantable Pulse Generator Implant Date: 20241226
Lead Channel Impedance Value: 323 Ohm
Lead Channel Impedance Value: 361 Ohm
Lead Channel Impedance Value: 475 Ohm
Lead Channel Impedance Value: 532 Ohm
Lead Channel Pacing Threshold Amplitude: 0.5 V
Lead Channel Pacing Threshold Amplitude: 0.625 V
Lead Channel Pacing Threshold Pulse Width: 0.4 ms
Lead Channel Pacing Threshold Pulse Width: 0.4 ms
Lead Channel Sensing Intrinsic Amplitude: 0.5 mV
Lead Channel Sensing Intrinsic Amplitude: 0.5 mV
Lead Channel Sensing Intrinsic Amplitude: 6.125 mV
Lead Channel Sensing Intrinsic Amplitude: 6.125 mV
Lead Channel Setting Pacing Amplitude: 2 V
Lead Channel Setting Pacing Amplitude: 2.75 V
Lead Channel Setting Pacing Pulse Width: 0.4 ms
Lead Channel Setting Sensing Sensitivity: 1.2 mV
Zone Setting Status: 755011
Zone Setting Status: 755011

## 2024-04-12 NOTE — Patient Instructions (Signed)
 Medication Instructions:    FOR 3 DAYS ONLY : TAKE TWO TABLETS OF BUMEX   ONCE A DAY  THEN RESUME BACK TO NORMAL DOSE   *If you need a refill on your cardiac medications before your next appointment, please call your pharmacy*   Lab Work:  NONE ORDERED  TODAY      If you have labs (blood work) drawn today and your tests are completely normal, you will receive your results only by: MyChart Message (if you have MyChart) OR A paper copy in the mail If you have any lab test that is abnormal or we need to change your treatment, we will call you to review the results.    Testing/Procedures: Your physician has recommended that you have an ablation. Catheter ablation is a medical procedure used to treat some cardiac arrhythmias (irregular heartbeats). During catheter ablation, a long, thin, flexible tube is put into a blood vessel in your groin (upper thigh), or neck. This tube is called an ablation catheter. It is then guided to your heart through the blood vessel. Radio frequency waves destroy small areas of heart tissue where abnormal heartbeats may cause an arrhythmia to start. Please see the instruction sheet given to you today.      Follow-Up: At [facility], you and your health needs are our priority.  As part of our continuing mission to provide you with exceptional heart care, our providers are all part of one team.  This team includes your primary Cardiologist (physician) and Advanced Practice Providers or APPs (Physician Assistants and Nurse Practitioners) who all work together to provide you with the care you need, when you need it.  Your next appointment:  AS SCHEDULED WITH DR KENNYTH    Provider:    Fonda KENNYTH, MD ONLY !!!   We recommend signing up for the patient portal called MyChart.  Sign up information is provided on this After Visit Summary.  MyChart is used to connect with patients for Virtual Visits (Telemedicine).  Patients are able to view lab/test  results, encounter notes, upcoming appointments, etc.  Non-urgent messages can be sent to your provider as well.   To learn more about what you can do with MyChart, go to [email or web address].   Other Instructions

## 2024-04-13 ENCOUNTER — Other Ambulatory Visit: Payer: Self-pay

## 2024-04-13 ENCOUNTER — Ambulatory Visit: Payer: Self-pay | Admitting: Nurse Practitioner

## 2024-04-13 ENCOUNTER — Other Ambulatory Visit: Payer: Self-pay | Admitting: *Deleted

## 2024-04-13 DIAGNOSIS — Z79899 Other long term (current) drug therapy: Secondary | ICD-10-CM

## 2024-04-13 LAB — BASIC METABOLIC PANEL WITH GFR
BUN/Creatinine Ratio: 24 (ref 12–28)
BUN: 24 mg/dL (ref 8–27)
CO2: 23 mmol/L (ref 20–29)
Calcium: 9.2 mg/dL (ref 8.7–10.3)
Chloride: 102 mmol/L (ref 96–106)
Creatinine, Ser: 1 mg/dL (ref 0.57–1.00)
Glucose: 80 mg/dL (ref 70–99)
Potassium: 5.6 mmol/L — ABNORMAL HIGH (ref 3.5–5.2)
Sodium: 138 mmol/L (ref 134–144)
eGFR: 55 mL/min/1.73 — ABNORMAL LOW (ref >59)

## 2024-04-13 MED ORDER — POTASSIUM CHLORIDE ER 10 MEQ PO TBCR: 10.0000 meq | EXTENDED_RELEASE_TABLET | Freq: Every day | ORAL | Status: DC

## 2024-04-13 NOTE — Patient Instructions (Signed)
 Visit Information  Thank you for taking time to visit with me today. Please don't hesitate to contact me if I can be of assistance to you before our next scheduled telephone appointment.  Our next appointment is by telephone on 04/20/24 at 2 pm.   Following is a copy of your care plan:   Goals Addressed             This Visit's Progress    VBCI Transitions of Care (TOC) Care Plan   On track    Problems: (All Reviewed and/or updated on call on 04/13/24).  Recent Hospitalization for treatment of Acute hypoxemic respiratory failure.   04/13/24  Patient reports that she continues to have some shortness of breath but she is much improve.   Bilateral Leg  Swelling: 04/13/24:  Patient reports her biggest concern is her selling and just not feeling well or right.  Reports that her legs look like they will pop if she stuck a needle in them.  Reports weight is unchanged and is ranging from 114-117 pounds.  Patient reports weight this am at 117 lbs Patient reports that she is following her low salt diet.  Reports she is wearing her compression hose and keeping her legs elevated.   Reports that she restarted her bisoprolol  at a half dose because her heart rate when up when she got home.   She reports that she developed a yeast infection  04/13/24: Revolved after completing Diflucan  course of treatment.  Patient reports that her BP range has been 76-103/37-57.  Patient reports blood pressure last evening on 04/12/24 was 104/47, and that this is better than usual.  Reports BP is always low for her.   04/13/24: Update: Went to see cardiology EP provider 04/12/24 as she still felt unwell, fatigued, edematous, and is now scheduled for Ablation for arrhythmias on 05/30/24  Persistent wound on leg from an old injury. Patient soaking in warm water /Dove soap and applying Bactroban  per instructions/prescribed Has appointment with an orhto provider on 04/19/24 to address this would, which was reopened after hitting  her log on corner of dishwasher door.   Goal:  Over the next 30 days, the patient will not experience hospital readmission  Interventions: Reviewed and/or updated 04/13/24.  Transitions of Care: Doctor Visits  - discussed the importance of doctor visits. Completed follow up with cardiologist 04/09/24 Made and completed appointment with cardiology EP provider.   Ablation scheduled for 05/30/24 for arrhythmias identified at this visit.  Self Care: Encouraged patient to continue to weigh daily, follow her low salt diet, elevate legs and wear her compression hose Reviewed current weight and BP readings. She is very hopeful this ablation procedure will provide her with some improvements.  Wound care: Wound care on leg per discussion on this call Medications: Continue to take medications as prescribed.  04/13/24 : Patient noted temporary changes in medications: Bumex  and Potassium made since OV 7/14 and 7/17.  Concerns/Anxiety: Provided a listening ear and offered support and reassurance. 04/13/24 Patient anxious and concerned about her health and that she continued not feeling well and contacted cardiology EP provider on her own.  Provided a good bit of recounting of her conditions, listening, reviewing upcoming procedures Patient stated she felt better after talking to someone she felt is listening.    Patient Self Care Activities: Reviewed and/or updated 04/13/24.  Attend all scheduled provider appointments Call pharmacy for medication refills 3-7 days in advance of running out of medications Call provider office for new  concerns or questions  Financial trader of TOC call rescheduling needs Participate in Transition of Care Program/Attend TOC scheduled calls Perform all self care activities independently  Perform IADL's (shopping, preparing meals, housekeeping, managing finances) independently Take medications as prescribed   Continue to weigh daily and follow your low salt  diet. Continues to elevate your legs and wear your compression hose.  Call MD for weight gain.  Attend appointment to address wound/wound care on leg per discussion on this call and scheduled appointment.   Plan:   Telephone follow up appointment with care management team member scheduled for: 04/20/2024 with [facility] team)        The patient verbalized understanding of instructions, educational materials, and care plan provided today and agreed to receive a mailed copy of patient instructions, educational materials, and care plan.   Telephone follow up appointment with care management team member scheduled for: 04/20/24 at 2 pm with [facility] RN CM team member.  The patient has been provided with contact information for the care management team and has been advised to call with any health related questions or concerns.   Please call the care guide team at [phone] if you need to cancel or reschedule your appointment.   Please call the Suicide and Crisis Lifeline: 988 call 1-800-273-TALK (toll free, 24 hour hotline) call 911 if you are experiencing a Mental Health or Behavioral Health Crisis or need someone to talk to.   Bing Edison MSN, RN RN Case Sales executive Health  VBCI-Population Health Office Hours M-F [phone] Direct Dial: [phone] Main Phone [phone]  Fax: [phone] [email or web address]

## 2024-04-13 NOTE — Transitions of Care (Post Inpatient/ED Visit) (Signed)
 Transition of Care week 3  Visit Note  04/13/2024  Name: Catherine [name] MRN: [id number]          DOB: [birth year]  Situation: Patient enrolled in [facility] 30-day program. Visit completed with patient by telephone.   Background:   Initial Transition Care Management Follow-up Telephone Call    Past Medical History:  Diagnosis Date   Acute on chronic diastolic heart failure (HCC)    AKI (acute kidney injury) (HCC) 06/22/2022   Aortic stenosis, mild 07/26/2017   Arthritis    Asthma    last attack 02/2015   Atrial fibrillation, chronic (HCC)    Cellulitis of left lower extremity 08/17/2016   Ulcer associated with severe venous insufficiency   Chronic anticoagulation    Chronic diastolic CHF (congestive heart failure) (HCC)    Chronic kidney disease    RIGHT MANY KIDNEY INFECTIONS AND STONES   COPD (chronic obstructive pulmonary disease) (HCC)    Coronary artery disease    Dizziness    H/O: rheumatic fever    Heart murmur    Hypertension    PONV (postoperative nausea and vomiting)    ' SOMETIMES', BUT NOT ALWAYS   Primary hypertension    RHEUMATIC MITRAL STENOSIS 01/05/2008   Qualifier: Diagnosis of   By: Neysa MD, Clinton D        S/P Maze operation for atrial fibrillation 10/14/2016   Complete bilateral atrial lesion set using cryothermy and bipolar radiofrequency ablation - atrial appendage was not treated due to previous surgical procedure (open mitral commissurotomy)   S/P mitral valve replacement with bioprosthetic valve 10/14/2016   29 mm Medtronic Mosaic porcine bioprosthetic tissue valve   Sciatica 08/29/2023   Tricuspid regurgitation 07/14/2021   UTI (urinary tract infection)    Valvular heart disease    Has mitral stenosis with prior mitral commissurotomy in 1970    Assessment: Patient Reported Symptoms: Cognitive Cognitive Status: No symptoms reported, Able to follow simple commands, Alert and oriented to person, place, and time, Normal speech and language  skills      Neurological Neurological Review of Symptoms: No symptoms reported    HEENT HEENT Symptoms Reported: No symptoms reported      Cardiovascular Cardiovascular Symptoms Reported: Swelling in legs or feet, Irregular pulse, Palpitations, Other:, Fatigue Other Cardiovascular Symptoms: Fatigue, lower blood pressure, losing weight per patient. Does patient have uncontrolled Hypertension?: No Cardiovascular Management Strategies: Adequate rest, Medication therapy, Routine screening Do You Have a Working Readable Scale?: Yes Weight: 117 lb (53.1 kg) (Taken this am, patient weights self daily in the am.) Cardiovascular Self-Management Outcome: 3 (uncertain) Cardiovascular Comment: Went back to provider/cardiology EP provider and is now scheduled for Ablation on 05/30/24 for arrythmias.  Respiratory Respiratory Symptoms Reported: Shortness of breath Other Respiratory Symptoms: Gets a bit breathless if she talks to fast or intermittently. Additional Respiratory Details: No shortness of breath noted today subjectively on call. Respiratory Management Strategies: Adequate rest, Medication therapy, Routine screening Respiratory Self-Management Outcome: 3 (uncertain)  Endocrine Endocrine Symptoms Reported: No symptoms reported    Gastrointestinal Gastrointestinal Symptoms Reported: Nausea, Unintentional weight loss Gastrointestinal Management Strategies: Nutrition support, Coping strategies    Genitourinary Genitourinary Symptoms Reported: No symptoms reported Additional Genitourinary Details: Completed Diflucan , and symptoms resolved.    Integumentary Integumentary Symptoms Reported: Wound Additional Integumentary Details: Hit leg on dishwasher corner the other day. Old wound at same place, color is brown. Bilateral Edema in legs. Told to use polysporin and bandage. Also told to use Bactroban /Muciprocin. Soaks warm  water /dove soap nightly. Skin Management Strategies: Routine screening,  Dressing changes, Medication therapy  Musculoskeletal Musculoskelatal Symptoms Reviewed: No symptoms reported     Patient at Risk for Falls Due to: Other (Comment) (Cardiac arrythmias, lower blood pressure, fatigue, age)  Psychosocial Psychosocial Symptoms Reported: No symptoms reported, Hyperviligence Additional Psychological Details: No specific symptoms reported but patient has concerns about her health conditions and feels she is not being heard until last appointment, that something is not right with the way she feels so poorly. (Pt. stated). Behavioral Management Strategies: Medication therapy, Support system   Quality of Family Relationships: supportive Do you feel physically threatened by others?: No   Vitals:   04/13/24 1559  BP: (!) 104/47  Pulse: 70    Recommendation:   Continue Current Plan of Care And current updates.   Follow Up Plan:   Telephone follow-up in 1 week 04/20/24 with Alan Rumalda Bing Carolee MSN, RN RN Case Manager [facility]  [facility] Office Hours M-F [phone] Direct Dial: [phone] Main Phone [phone]  Fax: [phone] [email or web address]

## 2024-04-15 ENCOUNTER — Emergency Department (HOSPITAL_BASED_OUTPATIENT_CLINIC_OR_DEPARTMENT_OTHER): Admitting: Radiology

## 2024-04-15 ENCOUNTER — Inpatient Hospital Stay (HOSPITAL_BASED_OUTPATIENT_CLINIC_OR_DEPARTMENT_OTHER)
Admission: EM | Admit: 2024-04-15 | Discharge: 2024-04-25 | DRG: 291 | Disposition: A | Discharge status: Home / Self Care | Attending: Internal Medicine | Admitting: Internal Medicine

## 2024-04-15 ENCOUNTER — Other Ambulatory Visit: Payer: Self-pay

## 2024-04-15 ENCOUNTER — Encounter (HOSPITAL_BASED_OUTPATIENT_CLINIC_OR_DEPARTMENT_OTHER): Payer: Self-pay

## 2024-04-15 DIAGNOSIS — I480 Paroxysmal atrial fibrillation: Secondary | ICD-10-CM | POA: Diagnosis present

## 2024-04-15 DIAGNOSIS — Z7901 Long term (current) use of anticoagulants: Secondary | ICD-10-CM

## 2024-04-15 DIAGNOSIS — Z515 Encounter for palliative care: Secondary | ICD-10-CM

## 2024-04-15 DIAGNOSIS — R7989 Other specified abnormal findings of blood chemistry: Secondary | ICD-10-CM

## 2024-04-15 DIAGNOSIS — I495 Sick sinus syndrome: Secondary | ICD-10-CM | POA: Diagnosis present

## 2024-04-15 DIAGNOSIS — I2510 Atherosclerotic heart disease of native coronary artery without angina pectoris: Secondary | ICD-10-CM | POA: Diagnosis present

## 2024-04-15 DIAGNOSIS — J918 Pleural effusion in other conditions classified elsewhere: Secondary | ICD-10-CM | POA: Diagnosis present

## 2024-04-15 DIAGNOSIS — R627 Adult failure to thrive: Secondary | ICD-10-CM | POA: Diagnosis not present

## 2024-04-15 DIAGNOSIS — L89156 Pressure-induced deep tissue damage of sacral region: Secondary | ICD-10-CM | POA: Diagnosis not present

## 2024-04-15 DIAGNOSIS — E43 Unspecified severe protein-calorie malnutrition: Secondary | ICD-10-CM | POA: Diagnosis present

## 2024-04-15 DIAGNOSIS — Z953 Presence of xenogenic heart valve: Secondary | ICD-10-CM

## 2024-04-15 DIAGNOSIS — Z66 Do not resuscitate: Secondary | ICD-10-CM | POA: Diagnosis present

## 2024-04-15 DIAGNOSIS — Z79899 Other long term (current) drug therapy: Secondary | ICD-10-CM

## 2024-04-15 DIAGNOSIS — I872 Venous insufficiency (chronic) (peripheral): Secondary | ICD-10-CM | POA: Diagnosis present

## 2024-04-15 DIAGNOSIS — E871 Hypo-osmolality and hyponatremia: Secondary | ICD-10-CM | POA: Diagnosis not present

## 2024-04-15 DIAGNOSIS — J90 Pleural effusion, not elsewhere classified: Secondary | ICD-10-CM | POA: Diagnosis not present

## 2024-04-15 DIAGNOSIS — I952 Hypotension due to drugs: Secondary | ICD-10-CM | POA: Diagnosis present

## 2024-04-15 DIAGNOSIS — Z950 Presence of cardiac pacemaker: Secondary | ICD-10-CM

## 2024-04-15 DIAGNOSIS — G4700 Insomnia, unspecified: Secondary | ICD-10-CM | POA: Diagnosis present

## 2024-04-15 DIAGNOSIS — I482 Chronic atrial fibrillation, unspecified: Secondary | ICD-10-CM | POA: Diagnosis present

## 2024-04-15 DIAGNOSIS — I130 Hypertensive heart and chronic kidney disease with heart failure and stage 1 through stage 4 chronic kidney disease, or unspecified chronic kidney disease: Secondary | ICD-10-CM | POA: Diagnosis not present

## 2024-04-15 DIAGNOSIS — J9811 Atelectasis: Secondary | ICD-10-CM | POA: Diagnosis not present

## 2024-04-15 DIAGNOSIS — R54 Age-related physical debility: Secondary | ICD-10-CM | POA: Diagnosis present

## 2024-04-15 DIAGNOSIS — E876 Hypokalemia: Secondary | ICD-10-CM | POA: Diagnosis not present

## 2024-04-15 DIAGNOSIS — Z87891 Personal history of nicotine dependence: Secondary | ICD-10-CM

## 2024-04-15 DIAGNOSIS — F419 Anxiety disorder, unspecified: Secondary | ICD-10-CM | POA: Diagnosis present

## 2024-04-15 DIAGNOSIS — E872 Acidosis, unspecified: Secondary | ICD-10-CM | POA: Diagnosis not present

## 2024-04-15 DIAGNOSIS — I5033 Acute on chronic diastolic (congestive) heart failure: Secondary | ICD-10-CM | POA: Diagnosis present

## 2024-04-15 DIAGNOSIS — J4489 Other specified chronic obstructive pulmonary disease: Secondary | ICD-10-CM | POA: Diagnosis not present

## 2024-04-15 DIAGNOSIS — E785 Hyperlipidemia, unspecified: Secondary | ICD-10-CM | POA: Diagnosis present

## 2024-04-15 DIAGNOSIS — J449 Chronic obstructive pulmonary disease, unspecified: Secondary | ICD-10-CM | POA: Diagnosis present

## 2024-04-15 DIAGNOSIS — I4719 Other supraventricular tachycardia: Secondary | ICD-10-CM | POA: Diagnosis not present

## 2024-04-15 DIAGNOSIS — N179 Acute kidney failure, unspecified: Secondary | ICD-10-CM | POA: Diagnosis not present

## 2024-04-15 DIAGNOSIS — T502X5A Adverse effect of carbonic-anhydrase inhibitors, benzothiadiazides and other diuretics, initial encounter: Secondary | ICD-10-CM | POA: Diagnosis present

## 2024-04-15 DIAGNOSIS — R0602 Shortness of breath: Secondary | ICD-10-CM | POA: Diagnosis not present

## 2024-04-15 DIAGNOSIS — I87332 Chronic venous hypertension (idiopathic) with ulcer and inflammation of left lower extremity: Secondary | ICD-10-CM

## 2024-04-15 DIAGNOSIS — Z951 Presence of aortocoronary bypass graft: Secondary | ICD-10-CM

## 2024-04-15 DIAGNOSIS — E8809 Other disorders of plasma-protein metabolism, not elsewhere classified: Secondary | ICD-10-CM | POA: Diagnosis not present

## 2024-04-15 DIAGNOSIS — I10 Essential (primary) hypertension: Secondary | ICD-10-CM

## 2024-04-15 DIAGNOSIS — I509 Heart failure, unspecified: Secondary | ICD-10-CM

## 2024-04-15 DIAGNOSIS — Z23 Encounter for immunization: Secondary | ICD-10-CM

## 2024-04-15 DIAGNOSIS — L97822 Non-pressure chronic ulcer of other part of left lower leg with fat layer exposed: Secondary | ICD-10-CM | POA: Diagnosis not present

## 2024-04-15 DIAGNOSIS — L03116 Cellulitis of left lower limb: Secondary | ICD-10-CM | POA: Diagnosis present

## 2024-04-15 DIAGNOSIS — N1831 Chronic kidney disease, stage 3a: Secondary | ICD-10-CM | POA: Diagnosis present

## 2024-04-15 DIAGNOSIS — Z888 Allergy status to other drugs, medicaments and biological substances status: Secondary | ICD-10-CM

## 2024-04-15 DIAGNOSIS — L0390 Cellulitis, unspecified: Principal | ICD-10-CM

## 2024-04-15 DIAGNOSIS — L03115 Cellulitis of right lower limb: Secondary | ICD-10-CM | POA: Diagnosis not present

## 2024-04-15 DIAGNOSIS — R609 Edema, unspecified: Secondary | ICD-10-CM

## 2024-04-15 DIAGNOSIS — Z6820 Body mass index (BMI) 20.0-20.9, adult: Secondary | ICD-10-CM

## 2024-04-15 DIAGNOSIS — R918 Other nonspecific abnormal finding of lung field: Secondary | ICD-10-CM | POA: Diagnosis not present

## 2024-04-15 HISTORY — DX: Paroxysmal atrial fibrillation: I48.0

## 2024-04-15 LAB — URINALYSIS, ROUTINE W REFLEX MICROSCOPIC
Bacteria, UA: NONE SEEN
Bilirubin Urine: NEGATIVE
Glucose, UA: NEGATIVE mg/dL
Ketones, ur: NEGATIVE mg/dL
Leukocytes,Ua: NEGATIVE
Nitrite: NEGATIVE
Protein, ur: >300 mg/dL — AB
Specific Gravity, Urine: 1.013 (ref 1.005–1.030)
pH: 6 (ref 5.0–8.0)

## 2024-04-15 LAB — BASIC METABOLIC PANEL WITH GFR
Anion gap: 14 (ref 5–15)
BUN: 29 mg/dL — ABNORMAL HIGH (ref 8–23)
CO2: 26 mmol/L (ref 22–32)
Calcium: 9.2 mg/dL (ref 8.9–10.3)
Chloride: 97 mmol/L — ABNORMAL LOW (ref 98–111)
Creatinine, Ser: 1.32 mg/dL — ABNORMAL HIGH (ref 0.44–1.00)
GFR, Estimated: 39 mL/min — ABNORMAL LOW (ref >60)
Glucose, Bld: 134 mg/dL — ABNORMAL HIGH (ref 70–99)
Potassium: 2.8 mmol/L — ABNORMAL LOW (ref 3.5–5.1)
Sodium: 137 mmol/L (ref 135–145)

## 2024-04-15 LAB — HEPATIC FUNCTION PANEL
ALT: 10 U/L (ref 0–44)
AST: 36 U/L (ref 15–41)
Albumin: 2.8 g/dL — ABNORMAL LOW (ref 3.5–5.0)
Alkaline Phosphatase: 94 U/L (ref 38–126)
Bilirubin, Direct: <0.1 mg/dL (ref 0.0–0.2)
Total Bilirubin: 0.2 mg/dL (ref 0.0–1.2)
Total Protein: 5.5 g/dL — ABNORMAL LOW (ref 6.5–8.1)

## 2024-04-15 LAB — LACTIC ACID, PLASMA
Lactic Acid, Venous: 1.5 mmol/L (ref 0.5–1.9)
Lactic Acid, Venous: 2 mmol/L (ref 0.5–1.9)

## 2024-04-15 LAB — CBC
HCT: 38.5 % (ref 36.0–46.0)
Hemoglobin: 13.2 g/dL (ref 12.0–15.0)
MCH: 30.3 pg (ref 26.0–34.0)
MCHC: 34.3 g/dL (ref 30.0–36.0)
MCV: 88.5 fL (ref 80.0–100.0)
Platelets: 146 K/uL — ABNORMAL LOW (ref 150–400)
RBC: 4.35 MIL/uL (ref 3.87–5.11)
RDW: 13.1 % (ref 11.5–15.5)
WBC: 9.5 K/uL (ref 4.0–10.5)
nRBC: 0 % (ref 0.0–0.2)

## 2024-04-15 LAB — PRO BRAIN NATRIURETIC PEPTIDE: Pro Brain Natriuretic Peptide: 14671 pg/mL — ABNORMAL HIGH (ref <300.0)

## 2024-04-15 LAB — TROPONIN T, HIGH SENSITIVITY
Troponin T High Sensitivity: 135 ng/L (ref <19)
Troponin T High Sensitivity: 129 ng/L (ref <19)

## 2024-04-15 LAB — MAGNESIUM: Magnesium: 1.8 mg/dL (ref 1.7–2.4)

## 2024-04-15 MED ORDER — TETANUS-DIPHTH-ACELL PERTUSSIS 5-2.5-18.5 LF-MCG/0.5 IM SUSY
0.5000 mL | PREFILLED_SYRINGE | Freq: Once | INTRAMUSCULAR | Status: AC
  Administered 2024-04-15: 0.5 mL via INTRAMUSCULAR
  Filled 2024-04-15: qty 0.5

## 2024-04-15 MED ORDER — ALBUTEROL SULFATE (2.5 MG/3ML) 0.083% IN NEBU
2.5000 mg | INHALATION_SOLUTION | Freq: Four times a day (QID) | RESPIRATORY_TRACT | Status: DC | PRN
  Administered 2024-04-17: 2.5 mg via RESPIRATORY_TRACT
  Filled 2024-04-15: qty 3

## 2024-04-15 MED ORDER — ALUM & MAG HYDROXIDE-SIMETH 200-200-20 MG/5ML PO SUSP
30.0000 mL | Freq: Once | ORAL | Status: AC
  Administered 2024-04-15: 30 mL via ORAL
  Filled 2024-04-15: qty 30

## 2024-04-15 MED ORDER — HYDROCODONE-ACETAMINOPHEN 5-325 MG PO TABS
1.0000 | ORAL_TABLET | Freq: Four times a day (QID) | ORAL | Status: DC | PRN
  Administered 2024-04-15 – 2024-04-16 (×3): 1 via ORAL
  Filled 2024-04-15 (×3): qty 1

## 2024-04-15 MED ORDER — POTASSIUM CHLORIDE 10 MEQ/100ML IV SOLN
10.0000 meq | INTRAVENOUS | Status: DC
  Filled 2024-04-15: qty 100

## 2024-04-15 MED ORDER — POTASSIUM CHLORIDE 20 MEQ PO PACK
40.0000 meq | PACK | Freq: Once | ORAL | Status: AC
  Administered 2024-04-15: 40 meq via ORAL
  Filled 2024-04-15: qty 2

## 2024-04-15 MED ORDER — SODIUM CHLORIDE 0.9 % IV BOLUS
500.0000 mL | Freq: Once | INTRAVENOUS | Status: AC
  Administered 2024-04-15: 500 mL via INTRAVENOUS

## 2024-04-15 MED ORDER — CEFAZOLIN SODIUM-DEXTROSE 1-4 GM/50ML-% IV SOLN
1.0000 g | Freq: Once | INTRAVENOUS | Status: AC
  Administered 2024-04-15: 1 g via INTRAVENOUS
  Filled 2024-04-15: qty 50

## 2024-04-15 NOTE — Plan of Care (Addendum)
 [facility] emergency department progressive unit [facility] tranfers:  89 year old female past medical history of atrial fibrillation on Coumadin , pacemaker, severe mitral stenosis status post bioprosthetic mitral valve replacement, coronary artery disease s/p CABG, essential hypertension, hyperlipidemia, HFpEF 60 to 70%, chronic back pain, COPD and chronic physical debility presented emergency department complaining of nonproductive cough and shortness of breath and swelling of the abdomen that started since yesterday 7/19.also left lower wound from recent bump.  At presentation to ED patient found tachycardic, borderline hypotensive otherwise hemodynamically stable. Troponin 135 trended down to 129. Elevated proBNP 14,000. Elevated lactic acid level 2. UA unremarkable. CBC unremarkable except low platelet 146. BMP showing low potassium 2.8, elevated creatinine 1.32.  Normal hepatic function panel except low albumin  2.8.  Normal mag level 1.8.  EKG showed atrial fibrillation rate controlled 76.  There is no evidence of ST and T wave abnormality. Chest x-ray showing atelectasis, small right pleural effusion.  No evidence of pulmonary edema or pneumonia.  ED physician reported that patient has been scheduled for outpatient cardiac ablation very soon.  Recently she had episode of AKI however renal function has been improved but today it is again worsening.  Also patient has left lower extremity wound/cellulitis.  In the ED patient received Ancef  and Td booster.  Also received oral KCl and 500 mL of NS bolus due to setting of low blood pressure. Also at this time in the setting of elevated BNP concern for underlying CHF exacerbation as well.  But due to low blood pressure limiting use of Lasix  to treat it.  Dr. Bari was wondering if cardiology can do the ablation this time while patient is here in the hospital. Inform Dr. Bari consult cardiology as well.  Hospitalist has been consulted for further  dimension management of sepsis in the setting of left lower extremity cellulitis, acute on chronic CHF exacerbation, demand ischemia,  hypokalemia, elevated, AKI on CKD stage IIIb.  Update, Dr. Bari spoke with on-call cardiology fellow Dr. Charlott stated that reconsult cardiology in the morning so they can fully evaluate her and see if the ablation needs to be worked up. But he otherwise feels she is stable overnight. If anything changes  can consult the cardiology tonight.    [facility] will assume care on arrival to accepting facility. Until arrival, care as per EDP. However, [facility] available 24/7 for questions and assistance. Check [email or web address] for on-call coverage. Nursing staff, please call [facility] Admits & Consults System-Wide number under Amion on patient's arrival so appropriate admitting provider can evaluate the pt.   Author: Kendrick Haapala, MD  [facility]

## 2024-04-15 NOTE — ED Notes (Signed)
 Pt reports that she has taken 2 nebulizer treatments at home prior to arrival with no relief. Hx of COPD and CHF.

## 2024-04-15 NOTE — ED Notes (Signed)
 Lactic of 2.0 and troponin 129 results given to Dr. MARLA. Horton.

## 2024-04-15 NOTE — ED Notes (Signed)
 Pt husband (Will Smick [phone]) would like to be contacted when pt gets a room.

## 2024-04-15 NOTE — ED Triage Notes (Signed)
 Pt reports increased SOB since yesterday. Pt reports non-productive cough x1 week. Pt also has increased swelling in abdomen. Pt also endorses nausea.

## 2024-04-15 NOTE — ED Provider Notes (Signed)
 [place] EMERGENCY DEPARTMENT AT [facility] Provider Note   CSN: [id number] Arrival date & time: 04/15/24  1518     Patient presents with: Shortness of Breath   Catherine [name] is a 89 y.o. female.   HPI   89 year old female presents emergency department with multiple complaints.  Patient recently had a pacemaker placed in December 2024, since then she has not felt completely well.  She continues to have episodes of junctional tachycardia and is planned for ablation in 2 months.  She feels that this is contributing mainly to how she is feeling.  But she presents today complaining of worsening bilateral lower extremity swelling as well as a wound on the left shin.  She denies any fever but does admit to exertional shortness of breath.  Has been taking her diuretic as prescribed without significant relief of the lower extremity swelling.  Of note she does have history of hypokalemia, is typically on supplements but recently had blood work that showed elevated potassium and this was discontinued.  Denies any diarrhea or genitourinary complaints.  Prior to Admission medications   Medication Sig Start Date End Date Taking? Authorizing Provider  acetaminophen  (TYLENOL ) 500 MG tablet Take 500 mg by mouth 2 (two) times daily.    [provider]  albuterol  (PROVENTIL ) (2.5 MG/3ML) 0.083% nebulizer solution Take 3 mLs (2.5 mg total) by nebulization every 6 (six) hours as needed (during COPD Exacerbation). 02/15/24 02/14/25  Hope Almarie ORN, NP  albuterol  (VENTOLIN  HFA) 108 (90 Base) MCG/ACT inhaler INHALE 2 PUFFS INTO THE LUNGS EVERY 4 HOURS AS NEEDED FOR AHEEZING OR SHORTNESS OF BREATH 02/21/24   Parrett, Tammy S, NP  apixaban  (ELIQUIS ) 2.5 MG TABS tablet TAKE ONE TABLET TWICE DAILY (START 09/18/23 OR AS DIRECTED) 10/24/23   Burchette, Wolm ORN, MD  bumetanide  (BUMEX ) 2 MG tablet Take 1 tablet (2 mg total) by mouth daily. 04/09/24 05/09/24  Swinyer, Rosaline CHRISTELLA, NP  cholecalciferol  (VITAMIN D3) 25 MCG (1000 UNIT) tablet Take 1,000 Units by mouth daily.    [provider]  diazepam  (VALIUM ) 5 MG tablet TAKE ONE TABLET BY MOUTH AT BEDTIME 12/12/23   Burchette, Wolm ORN, MD  estradiol  (ESTRACE ) 0.1 MG/GM vaginal cream Place 1 Applicatorful vaginally See admin instructions. Apply a small amount vaginally three nights a week on Monday, Wednesday and Friday.    [provider]  fluticasone  (FLONASE ) 50 MCG/ACT nasal spray Place 1 spray into both nostrils daily as needed for allergies or rhinitis. 08/30/22   Sood, Vineet, MD  HYDROcodone -acetaminophen  (NORCO/VICODIN) 5-325 MG tablet TAKE ONE TABLET EVERY 8 HOURS AS NEEDED FOR PAIN 04/01/24   Burchette, Wolm ORN, MD  metolazone  (ZAROXOLYN ) 5 MG tablet TAKE ONE TABLET WEEKLY 30 MINUTES BEFORE BUMEX  DOSE. 04/09/24   Swinyer, Rosaline CHRISTELLA, NP  mupirocin  ointment (BACTROBAN ) 2 % APPLY TOPICALLY ONCE A DAY 03/16/24   Burchette, Wolm ORN, MD  potassium chloride  (KLOR-CON ) 10 MEQ tablet Take 1 tablet (10 mEq total) by mouth daily. 04/13/24 07/12/24  Swinyer, Rosaline CHRISTELLA, NP  sacubitril -valsartan  (ENTRESTO ) 24-26 MG Take 1/2 tablet twice a day 04/06/24   Walker, Caitlin S, NP    Allergies: Aldactone  [spironolactone ], Amoxil  [amoxicillin ], Zebeta  [bisoprolol ], Calan  [verapamil ], Crestor  [rosuvastatin ], Flovent  [fluticasone  propionate], Livalo  [pitavastatin ], Lyrica [pregabalin], Neurontin [gabapentin], Norvasc  [amlodipine ], Zetia  [ezetimibe ], Zocor  [simvastatin ], Lotensin [benazepril], and Polytrim  [polymyxin b -trimethoprim ]    Review of Systems  Updated Vital Signs BP (!) 106/58   Pulse (!) 121   Temp 98 F (36.7 C) (Oral)  Resp 13   Ht 5' 3 (1.6 m)   Wt 52.2 kg   SpO2 93%   BMI 20.37 kg/m   Physical Exam  (all labs ordered are listed, but only abnormal results are displayed) Labs Reviewed  BASIC METABOLIC PANEL WITH GFR - Abnormal; Notable for the following components:      Result Value   Potassium 2.8 (*)    Chloride  97 (*)    Glucose, Bld 134 (*)    BUN 29 (*)    Creatinine, Ser 1.32 (*)    GFR, Estimated 39 (*)    All other components within normal limits  CBC - Abnormal; Notable for the following components:   Platelets 146 (*)    All other components within normal limits  PRO BRAIN NATRIURETIC PEPTIDE - Abnormal; Notable for the following components:   Pro Brain Natriuretic Peptide 14,671.0 (*)    All other components within normal limits  URINALYSIS, ROUTINE W REFLEX MICROSCOPIC - Abnormal; Notable for the following components:   Hgb urine dipstick SMALL (*)    Protein, ur >300 (*)    All other components within normal limits  LACTIC ACID, PLASMA - Abnormal; Notable for the following components:   Lactic Acid, Venous 2.0 (*)    All other components within normal limits  HEPATIC FUNCTION PANEL - Abnormal; Notable for the following components:   Total Protein 5.5 (*)    Albumin  2.8 (*)    All other components within normal limits  TROPONIN T, HIGH SENSITIVITY - Abnormal; Notable for the following components:   Troponin T High Sensitivity 135 (*)    All other components within normal limits  TROPONIN T, HIGH SENSITIVITY - Abnormal; Notable for the following components:   Troponin T High Sensitivity 129 (*)    All other components within normal limits  MAGNESIUM   LACTIC ACID, PLASMA    EKG: EKG Interpretation Date/Time:  Sunday April 15 2024 15:29:18 EDT Ventricular Rate:  76 PR Interval:    QRS Duration:  105 QT Interval:  414 QTC Calculation: 441 R Axis:   31  Text Interpretation: Atrial fibrillation Anteroseptal infarct, old Repol abnrm, severe global ischemia (LM/MVD) similar to previous Confirmed by Bari Flank [phone]) on 04/15/2024 3:33:20 PM  Radiology: DG Chest 2 View Result Date: 04/15/2024 CLINICAL DATA:  Short of breath. EXAM: CHEST - 2 VIEW COMPARISON:  03/16/2024.  CT, 09/10/2023. FINDINGS: Stable changes from previous cardiac surgery. Cardiac silhouette normal in  size. Left anterior chest wall dual lead pacemaker is stable and well positioned. No mediastinal or hilar masses. No evidence of adenopathy. Small right pleural effusion. Additional opacity at both lung bases consistent with atelectasis. Chronic bilateral interstitial thickening. No overt pulmonary edema. No pneumothorax. Mild depression of the upper endplate of a lower thoracic vertebra, stable from the prior chest CT. No acute fracture. IMPRESSION: 1. Lung base opacities suspected to be atelectasis, associated with a small right pleural effusion. 2. No convincing pneumonia or pulmonary edema. Electronically Signed   By: Alm Parkins M.D.   On: 04/15/2024 16:41     Procedures   Medications Ordered in the ED  Tdap (BOOSTRIX ) injection 0.5 mL (0.5 mLs Intramuscular Given 04/15/24 1711)  sodium chloride  0.9 % bolus 500 mL (0 mLs Intravenous Stopped 04/15/24 1803)  ceFAZolin  (ANCEF ) IVPB 1 g/50 mL premix (0 g Intravenous Stopped 04/15/24 2052)  potassium chloride  (KLOR-CON ) packet 40 mEq (40 mEq Oral Given 04/15/24 2015)  Medical Decision Making Amount and/or Complexity of Data Reviewed Labs: ordered. Radiology: ordered.  Risk Prescription drug management. Decision regarding hospitalization.   89 year old female presents emergency department with concern for lower extremity edema, wounds on the left leg, general unwell feeling.  Vitals are stable on arrival.  EKG is a paced rhythm, unchanged from previous.  No active chest pain.  Blood work shows an elevated but flat troponin, proBNP is elevated over 14,000.  Chest x-ray shows a small pleural effusion but no overt pulmonary edema.  Potassium is low today around 2.8, which is lower than her baseline hypokalemia.  Of note she had stopped her oral potassium supplements after she had hyperkalemia on outpatient blood work.  She is compliant with her diuretic.  The left shin has a small wound that has surrounding  erythema/warmth.  Will treat as a superficial cellulitis.  Her lactic is 2 but her white cell count is normal, no fever, doubt sepsis at this time.  Patient does have runs of tachycardia on the monitor.  Reviewing cardiology note has acknowledged this.  She is planned for ablation in September.  Concern would be if this tachycardia is contributing to decompensation or heart failure.  Will consult to cardiology for their opinion.  Otherwise patient will be admitted for further evaluation and treatment.  Patients evaluation and results requires admission for further treatment and care.  Spoke with hospitalist, reviewed patient's ED course and they accept admission.  Patient agrees with admission plan, offers no new complaints and is stable/unchanged at time of admit.     Final diagnoses:  Cellulitis, unspecified cellulitis site  Edema, unspecified type  Elevated troponin    ED Discharge Orders     None          Bari Roxie HERO, DO 04/15/24 2100

## 2024-04-16 ENCOUNTER — Encounter (HOSPITAL_COMMUNITY): Payer: Self-pay | Admitting: Internal Medicine

## 2024-04-16 DIAGNOSIS — I361 Nonrheumatic tricuspid (valve) insufficiency: Secondary | ICD-10-CM

## 2024-04-16 DIAGNOSIS — G4700 Insomnia, unspecified: Secondary | ICD-10-CM | POA: Diagnosis present

## 2024-04-16 DIAGNOSIS — N179 Acute kidney failure, unspecified: Secondary | ICD-10-CM | POA: Diagnosis present

## 2024-04-16 DIAGNOSIS — R7989 Other specified abnormal findings of blood chemistry: Secondary | ICD-10-CM | POA: Diagnosis not present

## 2024-04-16 DIAGNOSIS — E785 Hyperlipidemia, unspecified: Secondary | ICD-10-CM | POA: Diagnosis present

## 2024-04-16 DIAGNOSIS — I350 Nonrheumatic aortic (valve) stenosis: Secondary | ICD-10-CM

## 2024-04-16 DIAGNOSIS — N1831 Chronic kidney disease, stage 3a: Secondary | ICD-10-CM | POA: Diagnosis not present

## 2024-04-16 DIAGNOSIS — R609 Edema, unspecified: Secondary | ICD-10-CM | POA: Diagnosis not present

## 2024-04-16 DIAGNOSIS — I480 Paroxysmal atrial fibrillation: Secondary | ICD-10-CM | POA: Diagnosis not present

## 2024-04-16 DIAGNOSIS — R5381 Other malaise: Secondary | ICD-10-CM | POA: Diagnosis not present

## 2024-04-16 DIAGNOSIS — Z23 Encounter for immunization: Secondary | ICD-10-CM | POA: Diagnosis not present

## 2024-04-16 DIAGNOSIS — I5031 Acute diastolic (congestive) heart failure: Secondary | ICD-10-CM | POA: Diagnosis not present

## 2024-04-16 DIAGNOSIS — I4719 Other supraventricular tachycardia: Secondary | ICD-10-CM | POA: Diagnosis present

## 2024-04-16 DIAGNOSIS — I5032 Chronic diastolic (congestive) heart failure: Secondary | ICD-10-CM

## 2024-04-16 DIAGNOSIS — I495 Sick sinus syndrome: Secondary | ICD-10-CM | POA: Diagnosis present

## 2024-04-16 DIAGNOSIS — I509 Heart failure, unspecified: Secondary | ICD-10-CM | POA: Diagnosis not present

## 2024-04-16 DIAGNOSIS — Z66 Do not resuscitate: Secondary | ICD-10-CM | POA: Diagnosis not present

## 2024-04-16 DIAGNOSIS — Z7189 Other specified counseling: Secondary | ICD-10-CM | POA: Diagnosis not present

## 2024-04-16 DIAGNOSIS — I2510 Atherosclerotic heart disease of native coronary artery without angina pectoris: Secondary | ICD-10-CM | POA: Diagnosis not present

## 2024-04-16 DIAGNOSIS — Z515 Encounter for palliative care: Secondary | ICD-10-CM | POA: Diagnosis not present

## 2024-04-16 DIAGNOSIS — R0989 Other specified symptoms and signs involving the circulatory and respiratory systems: Secondary | ICD-10-CM | POA: Diagnosis not present

## 2024-04-16 DIAGNOSIS — E861 Hypovolemia: Secondary | ICD-10-CM

## 2024-04-16 DIAGNOSIS — J4489 Other specified chronic obstructive pulmonary disease: Secondary | ICD-10-CM | POA: Diagnosis present

## 2024-04-16 DIAGNOSIS — L03116 Cellulitis of left lower limb: Secondary | ICD-10-CM | POA: Diagnosis present

## 2024-04-16 DIAGNOSIS — Z48813 Encounter for surgical aftercare following surgery on the respiratory system: Secondary | ICD-10-CM | POA: Diagnosis not present

## 2024-04-16 DIAGNOSIS — I482 Chronic atrial fibrillation, unspecified: Secondary | ICD-10-CM | POA: Diagnosis not present

## 2024-04-16 DIAGNOSIS — L89156 Pressure-induced deep tissue damage of sacral region: Secondary | ICD-10-CM | POA: Diagnosis present

## 2024-04-16 DIAGNOSIS — I10 Essential (primary) hypertension: Secondary | ICD-10-CM | POA: Diagnosis not present

## 2024-04-16 DIAGNOSIS — I5033 Acute on chronic diastolic (congestive) heart failure: Secondary | ICD-10-CM | POA: Diagnosis not present

## 2024-04-16 DIAGNOSIS — J449 Chronic obstructive pulmonary disease, unspecified: Secondary | ICD-10-CM | POA: Diagnosis not present

## 2024-04-16 DIAGNOSIS — E871 Hypo-osmolality and hyponatremia: Secondary | ICD-10-CM | POA: Diagnosis present

## 2024-04-16 DIAGNOSIS — Z953 Presence of xenogenic heart valve: Secondary | ICD-10-CM | POA: Diagnosis not present

## 2024-04-16 DIAGNOSIS — I87332 Chronic venous hypertension (idiopathic) with ulcer and inflammation of left lower extremity: Secondary | ICD-10-CM | POA: Diagnosis not present

## 2024-04-16 DIAGNOSIS — J90 Pleural effusion, not elsewhere classified: Secondary | ICD-10-CM | POA: Diagnosis not present

## 2024-04-16 DIAGNOSIS — J918 Pleural effusion in other conditions classified elsewhere: Secondary | ICD-10-CM | POA: Diagnosis present

## 2024-04-16 DIAGNOSIS — I517 Cardiomegaly: Secondary | ICD-10-CM | POA: Diagnosis not present

## 2024-04-16 DIAGNOSIS — E876 Hypokalemia: Secondary | ICD-10-CM | POA: Diagnosis not present

## 2024-04-16 DIAGNOSIS — I130 Hypertensive heart and chronic kidney disease with heart failure and stage 1 through stage 4 chronic kidney disease, or unspecified chronic kidney disease: Secondary | ICD-10-CM | POA: Diagnosis present

## 2024-04-16 DIAGNOSIS — E43 Unspecified severe protein-calorie malnutrition: Secondary | ICD-10-CM | POA: Diagnosis not present

## 2024-04-16 DIAGNOSIS — J984 Other disorders of lung: Secondary | ICD-10-CM | POA: Diagnosis not present

## 2024-04-16 DIAGNOSIS — R627 Adult failure to thrive: Secondary | ICD-10-CM | POA: Diagnosis present

## 2024-04-16 DIAGNOSIS — L97822 Non-pressure chronic ulcer of other part of left lower leg with fat layer exposed: Secondary | ICD-10-CM | POA: Diagnosis present

## 2024-04-16 DIAGNOSIS — E8809 Other disorders of plasma-protein metabolism, not elsewhere classified: Secondary | ICD-10-CM | POA: Diagnosis not present

## 2024-04-16 DIAGNOSIS — E872 Acidosis, unspecified: Secondary | ICD-10-CM | POA: Diagnosis present

## 2024-04-16 LAB — C-REACTIVE PROTEIN: CRP: 0.5 mg/dL (ref <1.0)

## 2024-04-16 LAB — SEDIMENTATION RATE: Sed Rate: 55 mm/h — ABNORMAL HIGH (ref 0–22)

## 2024-04-16 MED ORDER — IPRATROPIUM-ALBUTEROL 0.5-2.5 (3) MG/3ML IN SOLN
3.0000 mL | Freq: Four times a day (QID) | RESPIRATORY_TRACT | Status: DC | PRN
  Administered 2024-04-23: 3 mL via RESPIRATORY_TRACT
  Filled 2024-04-16: qty 3

## 2024-04-16 MED ORDER — APIXABAN 2.5 MG PO TABS
2.5000 mg | ORAL_TABLET | Freq: Two times a day (BID) | ORAL | Status: DC
  Administered 2024-04-16 – 2024-04-25 (×19): 2.5 mg via ORAL
  Filled 2024-04-16 (×19): qty 1

## 2024-04-16 MED ORDER — MAGNESIUM SULFATE IN D5W 1-5 GM/100ML-% IV SOLN
1.0000 g | Freq: Once | INTRAVENOUS | Status: AC
  Administered 2024-04-16: 1 g via INTRAVENOUS
  Filled 2024-04-16: qty 100

## 2024-04-16 MED ORDER — MUPIROCIN CALCIUM 2 % EX CREA
TOPICAL_CREAM | Freq: Every day | CUTANEOUS | Status: DC
  Filled 2024-04-16: qty 15

## 2024-04-16 MED ORDER — SODIUM CHLORIDE 0.9 % IV SOLN
1.0000 g | INTRAVENOUS | Status: DC
  Administered 2024-04-16 – 2024-04-17 (×2): 1 g via INTRAVENOUS
  Filled 2024-04-16 (×2): qty 10

## 2024-04-16 MED ORDER — POTASSIUM CHLORIDE CRYS ER 20 MEQ PO TBCR
40.0000 meq | EXTENDED_RELEASE_TABLET | Freq: Three times a day (TID) | ORAL | Status: AC
  Administered 2024-04-16 (×3): 40 meq via ORAL
  Filled 2024-04-16 (×3): qty 2

## 2024-04-16 MED ORDER — APIXABAN 2.5 MG PO TABS
2.5000 mg | ORAL_TABLET | Freq: Once | ORAL | Status: AC
  Administered 2024-04-16: 2.5 mg via ORAL
  Filled 2024-04-16: qty 1

## 2024-04-16 MED ORDER — GUAIFENESIN ER 600 MG PO TB12
600.0000 mg | ORAL_TABLET | Freq: Two times a day (BID) | ORAL | Status: AC
  Administered 2024-04-16 – 2024-04-18 (×6): 600 mg via ORAL
  Filled 2024-04-16 (×6): qty 1

## 2024-04-16 NOTE — Consult Note (Addendum)
 WOC Nurse Consult Note: Reason for Consult: Consult requested for left leg wound and cellulitis.  Performed remotely after review of progress notes and photos in the EMR.  Left lower leg with full thickness wound, red and dry with loose peeling skin and generalized erythremia surrounding.  Pt is on systemic antibiotics for the cellulitis.   Dressing procedure/placement/frequency: Topical treatment orders provided for bedside nurses to perform as follows to promote moist healing: Apply Bactroban  to left leg wound Q day, then cover with foam dressing.  Change foam dressing Q 3 days or PRN soiling. Please re-consult if further assistance is needed.  Thank-you,  Stephane Fought MSN, RN, CWOCN, CWCN-AP, CNS Contact Mon-Fri 0700-1500: [phone]

## 2024-04-16 NOTE — H&P (Signed)
 History and Physical    Patient: Catherine [name] FMW:[id number] DOB: 1935-02-17 DOA: 04/15/2024 DOS: the patient was seen and examined on 04/16/2024 PCP: Micheal Wolm ORN, MD  Patient coming from: Home  Chief Complaint:  Chief Complaint  Patient presents with   Shortness of Breath   HPI: Catherine [name] is a 89 y.o. female with medical history significant of atrial fibrillation on Coumadin , pacemaker, severe mitral stenosis status post bioprosthetic mitral valve replacement, coronary artery disease s/p CABG, essential hypertension, hyperlipidemia, HFpEF (EF 60-70% in 2025), chronic back pain, COPD and chronic physical debility presented emergency department complaining of nonproductive cough, SOB, and LLE cellulitis.  Pt was in her USOH until this past Thursday when her SOB worsened. Pt presented to her cardiologist's office and was evaluated by Charlies Arthur, PA who recommended increasing her Bumex  1--2mg  daily for 3 days. Pt completed her course of increased Bumex  and despite this therapy became so SOB on Sunday evening that she presented to [facility] ED for evaluation.   At presentation to ED patient found tachycardic, borderline hypotensive otherwise hemodynamically stable. Troponin 135 trended down to 129. Elevated proBNP 14,000. Elevated lactic acid level 2. UA unremarkable. CBC unremarkable except low platelet 146. BMP showing low potassium 2.8, elevated creatinine 1.32.  Normal hepatic function panel except low albumin  2.8.  Normal mag level 1.8. EKG showed atrial fibrillation rate controlled 76.  There is no evidence of ST and T wave abnormality. Chest x-ray showing atelectasis, small right pleural effusion.  No evidence of pulmonary edema or pneumonia. EDP administered Ancef  and Td booster for LLE wound/cellulitis, and 500 mL of NS bolus due to setting of low blood pressure. Pt admitted to medicine PCU for ongoing evaluation.  Review of Systems: As mentioned in the history of present illness.  All other systems reviewed and are negative. Past Medical History:  Diagnosis Date   Acute on chronic diastolic heart failure (HCC)    AKI (acute kidney injury) (HCC) 06/22/2022   Aortic stenosis, mild 07/26/2017   Arthritis    Asthma    last attack 02/2015   Atrial fibrillation, chronic (HCC)    Cellulitis of left lower extremity 08/17/2016   Ulcer associated with severe venous insufficiency   Chronic anticoagulation    Chronic diastolic CHF (congestive heart failure) (HCC)    Chronic kidney disease    RIGHT MANY KIDNEY INFECTIONS AND STONES   COPD (chronic obstructive pulmonary disease) (HCC)    Coronary artery disease    Dizziness    H/O: rheumatic fever    Heart murmur    Hypertension    PONV (postoperative nausea and vomiting)    ' SOMETIMES', BUT NOT ALWAYS   Primary hypertension    RHEUMATIC MITRAL STENOSIS 01/05/2008   Qualifier: Diagnosis of   By: Neysa MD, Clinton D        S/P Maze operation for atrial fibrillation 10/14/2016   Complete bilateral atrial lesion set using cryothermy and bipolar radiofrequency ablation - atrial appendage was not treated due to previous surgical procedure (open mitral commissurotomy)   S/P mitral valve replacement with bioprosthetic valve 10/14/2016   29 mm Medtronic Mosaic porcine bioprosthetic tissue valve   Sciatica 08/29/2023   Tricuspid regurgitation 07/14/2021   UTI (urinary tract infection)    Valvular heart disease    Has mitral stenosis with prior mitral commissurotomy in 1970   Past Surgical History:  Procedure Laterality Date   ABDOMINAL HYSTERECTOMY  1983   endometriosis   APPENDECTOMY  BACK SURGERY     neurosurgery x2   CARDIAC CATHETERIZATION     CARDIAC CATHETERIZATION N/A 08/03/2016   Procedure: Right/Left Heart Cath and Coronary Angiography;  Surgeon: Peter M Swaziland, MD;  Location: [facility] INVASIVE CV LAB;  Service: Cardiovascular;  Laterality: N/A;   cataract surg     CHOLECYSTECTOMY N/A 05/30/2015   Procedure:  LAPAROSCOPIC CHOLECYSTECTOMY WITH INTRAOPERATIVE CHOLANGIOGRAM;  Surgeon: Morene Olives, MD;  Location: [facility] OR;  Service: General;  Laterality: N/A;   COLONOSCOPY     CORONARY ARTERY BYPASS GRAFT N/A 10/14/2016   Procedure: CORONARY ARTERY BYPASS GRAFTING (CABG);  Surgeon: Sudie VEAR Laine, MD;  Location: [facility] OR;  Service: Open Heart Surgery;  Laterality: N/A;   EYE SURGERY     MAZE N/A 10/14/2016   Procedure: MAZE;  Surgeon: Sudie VEAR Laine, MD;  Location: [facility] OR;  Service: Open Heart Surgery;  Laterality: N/A;   MITRAL VALVE REPLACEMENT N/A 10/14/2016   Procedure: REDO MITRAL VALVE REPLACEMENT (MVR);  Surgeon: Sudie VEAR Laine, MD;  Location: [facility] OR;  Service: Open Heart Surgery;  Laterality: N/A;   MITRAL VALVE SURGERY Left 1970   Open mitral commissurotomy via left thoracotomy approach   PACEMAKER IMPLANT N/A 09/22/2023   Procedure: PACEMAKER IMPLANT;  Surgeon: Kennyth Chew, MD;  Location: [facility] INVASIVE CV LAB;  Service: Cardiovascular;  Laterality: N/A;   TEE WITHOUT CARDIOVERSION N/A 07/05/2016   Procedure: TRANSESOPHAGEAL ECHOCARDIOGRAM (TEE);  Surgeon: Annabella Scarce, MD;  Location: [facility] ENDOSCOPY;  Service: Cardiovascular;  Laterality: N/A;   TEE WITHOUT CARDIOVERSION N/A 10/14/2016   Procedure: TRANSESOPHAGEAL ECHOCARDIOGRAM (TEE);  Surgeon: Sudie VEAR Laine, MD;  Location: [facility] OR;  Service: Open Heart Surgery;  Laterality: N/A;   Social History:  reports that she quit smoking about 48 years ago. Her smoking use included cigarettes. She started smoking about 63 years ago. She has a 15 pack-year smoking history. She has never used smokeless tobacco. She reports that she does not drink alcohol and does not use drugs.  Allergies  Allergen Reactions   Aldactone  [Spironolactone ] Other (See Comments)    Dyspnea    Amoxil  [Amoxicillin ] Palpitations and Other (See Comments)    Tachycardia   Zebeta  [Bisoprolol ] Shortness Of Breath   Calan  [Verapamil ] Other (See Comments)    Myalgias     Crestor  [Rosuvastatin ] Other (See Comments)    Myalgias    Flovent  [Fluticasone  Propionate] Other (See Comments)    Leg cramps   Livalo  [Pitavastatin ] Other (See Comments)    Myalgias    Lyrica [Pregabalin] Swelling   Neurontin [Gabapentin] Swelling   Norvasc  [Amlodipine ] Swelling    Low extremity edema   Zetia  [Ezetimibe ] Other (See Comments)    Myalgias   Zocor  [Simvastatin ] Other (See Comments)    Myalgias    Lotensin [Benazepril] Cough   Polytrim  [Polymyxin B -Trimethoprim ] Swelling    Family History  Problem Relation Age of Onset   Leukemia Father    Breast cancer Neg Hx     Prior to Admission medications   Medication Sig Start Date End Date Taking? Authorizing Provider  acetaminophen  (TYLENOL ) 500 MG tablet Take 500 mg by mouth 2 (two) times daily.   Yes [provider]  albuterol  (PROVENTIL ) (2.5 MG/3ML) 0.083% nebulizer solution Take 3 mLs (2.5 mg total) by nebulization every 6 (six) hours as needed (during COPD Exacerbation). 02/15/24 02/14/25 Yes Hope Almarie ORN, NP  albuterol  (VENTOLIN  HFA) 108 (90 Base) MCG/ACT inhaler INHALE 2 PUFFS INTO THE LUNGS EVERY 4 HOURS AS NEEDED FOR TOBE  OR SHORTNESS OF BREATH 02/21/24  Yes Parrett, Tammy S, NP  apixaban  (ELIQUIS ) 2.5 MG TABS tablet TAKE ONE TABLET TWICE DAILY (START 09/18/23 OR AS DIRECTED) 10/24/23  Yes Burchette, Wolm ORN, MD  bumetanide  (BUMEX ) 2 MG tablet Take 1 tablet (2 mg total) by mouth daily. 04/09/24 05/09/24 Yes Swinyer, Rosaline HERO, NP  cholecalciferol (VITAMIN D3) 25 MCG (1000 UNIT) tablet Take 1,000 Units by mouth daily.   Yes [provider]  diazepam  (VALIUM ) 5 MG tablet TAKE ONE TABLET BY MOUTH AT BEDTIME 12/12/23  Yes Burchette, Wolm ORN, MD  estradiol  (ESTRACE ) 0.1 MG/GM vaginal cream Place 1 Applicatorful vaginally See admin instructions. Apply a small amount vaginally three nights a week on Monday, Wednesday and Friday.   Yes [provider]  fluticasone  (FLONASE ) 50 MCG/ACT nasal  spray Place 1 spray into both nostrils daily as needed for allergies or rhinitis. 08/30/22  Yes Sood, Vineet, MD  HYDROcodone -acetaminophen  (NORCO/VICODIN) 5-325 MG tablet TAKE ONE TABLET EVERY 8 HOURS AS NEEDED FOR PAIN 04/01/24  Yes Burchette, Wolm ORN, MD  metolazone  (ZAROXOLYN ) 5 MG tablet TAKE ONE TABLET WEEKLY 30 MINUTES BEFORE BUMEX  DOSE. 04/09/24  Yes Swinyer, Rosaline HERO, NP  mupirocin  ointment (BACTROBAN ) 2 % APPLY TOPICALLY ONCE A DAY 03/16/24  Yes Burchette, Wolm ORN, MD  potassium chloride  (KLOR-CON ) 10 MEQ tablet Take 1 tablet (10 mEq total) by mouth daily. 04/13/24 07/12/24 Yes Swinyer, Rosaline HERO, NP  sacubitril -valsartan  (ENTRESTO ) 24-26 MG Take 1/2 tablet twice a day 04/06/24  Yes Vannie Reche RAMAN, NP    Physical Exam: Vitals:   04/16/24 0503 04/16/24 0600 04/16/24 0717 04/16/24 0800  BP: (!) 113/51 (!) 101/49 105/76 (!) 106/48  Pulse: 64 (!) 59 71 60  Resp: 15 15 16 18   Temp:   98 F (36.7 C)   TempSrc:   Oral   SpO2: 93% 93% 95% 95%  Weight:      Height:       General: Alert, oriented x3, resting comfortably in no acute distress Respiratory: Bibasilar rales; no wheezing Cardiovascular: Regular rate and rhythm w/o m/r/g   Data Reviewed:  Lab Results  Component Value Date   WBC 9.5 04/15/2024   HGB 13.2 04/15/2024   HCT 38.5 04/15/2024   MCV 88.5 04/15/2024   PLT 146 (L) 04/15/2024   Lab Results  Component Value Date   GLUCOSE 134 (H) 04/15/2024   CALCIUM  9.2 04/15/2024   NA 137 04/15/2024   K 2.8 (L) 04/15/2024   CO2 26 04/15/2024   CL 97 (L) 04/15/2024   BUN 29 (H) 04/15/2024   CREATININE 1.32 (H) 04/15/2024   Lab Results  Component Value Date   ALT 10 04/15/2024   AST 36 04/15/2024   ALKPHOS 94 04/15/2024   BILITOT 0.2 04/15/2024   Lab Results  Component Value Date   INR 2.1 (H) 09/13/2023   INR 1.8 (H) 09/12/2023   INR 1.5 (H) 09/10/2023    Radiology: DG Chest 2 View Result Date: 04/15/2024 CLINICAL DATA:  Short of breath. EXAM: CHEST - 2  VIEW COMPARISON:  03/16/2024.  CT, 09/10/2023. FINDINGS: Stable changes from previous cardiac surgery. Cardiac silhouette normal in size. Left anterior chest wall dual lead pacemaker is stable and well positioned. No mediastinal or hilar masses. No evidence of adenopathy. Small right pleural effusion. Additional opacity at both lung bases consistent with atelectasis. Chronic bilateral interstitial thickening. No overt pulmonary edema. No pneumothorax. Mild depression of the upper endplate of a lower thoracic vertebra, stable from  the prior chest CT. No acute fracture. IMPRESSION: 1. Lung base opacities suspected to be atelectasis, associated with a small right pleural effusion. 2. No convincing pneumonia or pulmonary edema. Electronically Signed   By: Alm Parkins M.D.   On: 04/15/2024 16:41    Assessment and Plan: 46F h/o atrial fibrillation on Coumadin , pacemaker, severe mitral stenosis status post bioprosthetic mitral valve replacement, coronary artery disease s/p CABG, essential hypertension, hyperlipidemia, HFpEF (EF 60-70% in 2025), chronic back pain, COPD and chronic physical debility presented emergency department complaining of nonproductive cough, SOB, hypotension 2/2 overdiuresis c/b AKI, and LLE cellulitis.  AKI Presumed pre-renal iso overdiuresis -HOLD MIVF for now per above -HOLD pta diuretics and allow slow recovery for now -Strict I&Os and daily weights (standing preferred) -F/u BMP daily -Renally dose medications for CrCl -Avoid lovenox , NSAIDs, morphine , Fleet's phosphate enema, regular insulin , contrast; no gadolinium for MRI to avoid nephrogenic systemic fibrosis -Consider renal US  and nephrology consult if worsening AKI or lack of improvementening AKI or lack of improvement  Junctional tachycardia SOB Per last cardiology note on 7/17, pt in/out of her junctional tachycardia and Dr. Waddell recommended amiodarone  and ablation -Cards consulted; apprec eval/recs  Hypotension  2/2 overdiuresis HFpEF -IVF bolus prn -IV lasix  once BP allows; goal net neg 1-2L/d; strict I/Os; daily standing weights; K>4/Mg>2 -HOLD pta Entresto  for now given low BP; resume once able to tolerate  LLE cellulitis -Wound care RN consulted; apprec eval/recs -IV CTX daily for now; consider oral meds in 24h  H/o vaginal candidiasis -Pt has history of vaginal candidiasis following oral abx and will need PO fluconazole  250mg  dose x1 after abx course  Hypokalemia 2/2 overdiuresis -Kdur x3 doses -F/u Mg and replete prn  COPD -Duonebs prn -OP Pulmonology on d/c for medication optimization   Advance Care Planning:   Code Status: Prior   Consults: Cardiology  Family Communication: N/A  Severity of Illness: The appropriate patient status for this patient is INPATIENT. Inpatient status is judged to be reasonable and necessary in order to provide the required intensity of service to ensure the patient's safety. The patient's presenting symptoms, physical exam findings, and initial radiographic and laboratory data in the context of their chronic comorbidities is felt to place them at high risk for further clinical deterioration. Furthermore, it is not anticipated that the patient will be medically stable for discharge from the hospital within 2 midnights of admission.   * I certify that at the point of admission it is my clinical judgment that the patient will require inpatient hospital care spanning beyond 2 midnights from the point of admission due to high intensity of service, high risk for further deterioration and high frequency of surveillance required.*   ------- I spent 55 minutes reviewing previous labs/notes, obtaining separate history at the bedside, counseling/discussing the treatment plan outlined above, ordering medications/tests, and performing clinical documentation.  Author: Marsha Ada, MD 04/16/2024 9:37 AM  For on call review [email or web address].

## 2024-04-16 NOTE — ED Notes (Signed)
 [facility] at bedside. Handoff given to staff.

## 2024-04-16 NOTE — ED Notes (Signed)
 Thomas with cl called for transport

## 2024-04-16 NOTE — Consult Note (Addendum)
 Cardiology Consultation   Patient ID: Catherine [name] MRN: [id number]; DOB: August 21, 1935  Admit date: 04/15/2024 Date of Consult: 04/16/2024  PCP:  Micheal Wolm ORN, MD   [facility] Providers Cardiologist:  Annabella Scarce, MD  Electrophysiologist:  Fonda Kitty, MD  -> recent plan to transition to Dr. Waddell Finn here to update MD or APP on Care Team, Refresh:1}     Patient Profile: Catherine [name] is a 89 y.o. 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 Georgina.  History of Present Illness: Catherine [name] has had a challenging few years with the above history of CABG, MVR and heart failure. She has a history of persistent LE edema despite diuretics with some component felt due to venous insufficiency. She has not felt well in most office visits. SGLT2i has been deferred due to recurrent UTIs. She was previously unable to tolerate compression socks and declined referral to [facility]. Lasix  previously switched to bumex  due to abdominal bloating. She was hospitalized in 10/2021 for AECOPD + HFpEF. Echo had shown EF 60-65%, RV mildly reduced, RVSP 58 mmHg, moderate BAE, normal appearing bioprosthetic MVR, moderate TR. She was readmitted 12/2021 for the same, along with hypoxia and AF RVR. This was complicated by some bradycardia as well. Metoprolol  was adjusted and she was started on amiodarone  but did not tolerate due to multiple side effects  and insomnia. During 2024 she had junctional bradycardia by EKG, with with multiple pauses on Zio and ultimately underwent MDT PPM 08/2023. EP notes indicate diffuse atrial scarring, requiring implant of passive RA lead in order to achieve any atrial sensed events. She also subsequently had device-detected NSVT requiring titration of metoprolol . This was later changed to bisoprolol  due to wheezing and feeling poorly, but patient did not tolerate this either. Otherwise intolerant to spironolactone  (dyspnea), verapamil  (myalgias), amlodipine  (edema), statins (myalgias). Labs have shown varying degrees of labile Cr, potassium. Last echo 10/2023 EF 65-70%, moderate LVH, mildly reduced RVSF, mildly enlarged RV, mildly elevated PASP, severe LAE, moderate RAE, s/p MVR c/w normal function, mild-moderate TR and AS, dilated IVC.  She was recently admitted 6/28-03/27/24 with worsening SOB, felt to be due to combination of a/c HFpEF and possible CAP, complicate by mild AKI and mild hyperkalemia. Was treated with IV Bumex  + metolazone  x1. Also received IV diltiazem  for 1 episode of AF RVR. She was advised to take Bumex  2 mg twice daily for 3 days then return to once daily dosing. She was seen by Rosaline Bane, NP on 04/09/24 with continued BLE edema. She was continued on Bumex  2mg  daily with 5mg  metolazone  once weekly. She was referred to Dr. Harden for evaluation of leg wounds and leg swelling/venous insufficiency. It was also noted that she had been unable to tolerate multiple beta blockers in varying degrees, including wheezing on metoprolol , therefore did not wish to add anything back in this regard. Follow-up with EP was encouraged especially in light of episodes of VT noted on remote transmission. She saw Charlies Arthur PA-C 04/12/24  with numerous complaints, feeling awful. Per Renee's note, EKG done today and reviewed by myself: SVT 110bpm, narrow QRS 74ms - Reviewed with Dr. Waddell > junctional tachycardia. DEVICE  interrogation done today and reviewed by myself Battery and auto lead measurements are good. She presents in VS 110bpm rhythm (EGM A and V are simultaneous) + AMS and HVR episodes. Some clearly are V>A and others A>V but both have the same V EGM. AP 97.7%. VP 95.4%. They discussed amiodarone  vs ablation; ultimate plan was to pursue ablation planned 05/30/24 when her daughter is in town. They felt it was unclear/unlikely that all her symptoms were due to the junctional tachycardia, though prolonged episodes could certainly make her feel poorly. She requested to transition care to Dr. Waddell. Labs 04/12/24 resulted with mild hyperkalemia of 5.6 therefore advised to stop her potassium x3 days then resume at lower dose. The patient subsequently gives somewhat conflicting information about her Bumex . At first she states she was told to take an extra dose of Bumex  on 7/18, then stated she has only been taking 1 Bumex  per day the last few days, then settled that she thinks she has been taking 2 Bumex  the last 3 days (as advised at EP appointment) She last took her metolazone  on Saturday.    In the meantime she presented to [facility] ED with increased SOB, nonproductive cough x1 week, increased swelling in her abdomen. She was also noted to have LLE wound and cellulitis. She received abx, potassium replacement, and IV fluid bolus due to hypotension, BP 89/57 on arrival. She was transferred to [facility] for further evaluation. Labs show proBNP 14,671, hsTroponin 135->129, lactic acid 1.5->2.0, H/H/WBC OK, plt 146k, K 2.8, albumin  2.8, BUN/Cr 29/1.32 (variable baseline 0.7->1.5), ESR 55, >300 protein in UA. Her edema has markedly improved with elevation in bed. She denies any chest pain, main complaint is the wound on her leg. Cardiology asked to assist with fluid management as well as arrhythmia since she is still having frequent episodes of tachycardia on telemetry suspected to be junctional tach similar to what was observed in the  office recently.  Per Epic review, has had progressive downtrend in weights from 130s-150s (2023) to 120s (2024) down to 115 on this admission.  Past Medical History:  Diagnosis Date   AKI (acute kidney injury) (HCC) 06/22/2022   Aortic stenosis, mild 07/26/2017   Arthritis    Asthma    last attack 02/2015   Cellulitis of left lower extremity 08/17/2016   Ulcer associated with severe venous insufficiency   Chronic anticoagulation    Chronic diastolic CHF (congestive heart failure) (HCC)    Chronic kidney disease    RIGHT MANY KIDNEY INFECTIONS AND STONES   COPD (chronic obstructive pulmonary disease) (HCC)    Coronary artery disease    Dizziness    H/O: rheumatic fever    Hypertension    PAF (paroxysmal atrial fibrillation) (HCC)    PONV (postoperative nausea and vomiting)    ' SOMETIMES', BUT NOT ALWAYS   Primary hypertension    RHEUMATIC MITRAL STENOSIS 01/05/2008   Qualifier: Diagnosis of   By: Neysa MD, Clinton D        S/P Maze operation for atrial fibrillation 10/14/2016   Complete bilateral atrial lesion set using cryothermy and bipolar radiofrequency ablation - atrial appendage was not treated due to previous surgical procedure (open mitral commissurotomy)   S/P mitral valve replacement with bioprosthetic valve 10/14/2016   29 mm Medtronic Mosaic porcine bioprosthetic tissue valve  Sciatica 08/29/2023   Tricuspid regurgitation 07/14/2021   UTI (urinary tract infection)    Valvular heart disease    Has mitral stenosis with prior mitral commissurotomy in 1970    Past Surgical History:  Procedure Laterality Date   ABDOMINAL HYSTERECTOMY  1983   endometriosis   APPENDECTOMY     BACK SURGERY     neurosurgery x2   CARDIAC CATHETERIZATION     CARDIAC CATHETERIZATION N/A 08/03/2016   Procedure: Right/Left Heart Cath and Coronary Angiography;  Surgeon: Peter M Swaziland, MD;  Location: [facility] INVASIVE CV LAB;  Service: Cardiovascular;  Laterality: N/A;   cataract surg      CHOLECYSTECTOMY N/A 05/30/2015   Procedure: LAPAROSCOPIC CHOLECYSTECTOMY WITH INTRAOPERATIVE CHOLANGIOGRAM;  Surgeon: Morene Olives, MD;  Location: [facility] OR;  Service: General;  Laterality: N/A;   COLONOSCOPY     CORONARY ARTERY BYPASS GRAFT N/A 10/14/2016   Procedure: CORONARY ARTERY BYPASS GRAFTING (CABG);  Surgeon: Sudie VEAR Laine, MD;  Location: [facility] OR;  Service: Open Heart Surgery;  Laterality: N/A;   EYE SURGERY     MAZE N/A 10/14/2016   Procedure: MAZE;  Surgeon: Sudie VEAR Laine, MD;  Location: [facility] OR;  Service: Open Heart Surgery;  Laterality: N/A;   MITRAL VALVE REPLACEMENT N/A 10/14/2016   Procedure: REDO MITRAL VALVE REPLACEMENT (MVR);  Surgeon: Sudie VEAR Laine, MD;  Location: [facility] OR;  Service: Open Heart Surgery;  Laterality: N/A;   MITRAL VALVE SURGERY Left 1970   Open mitral commissurotomy via left thoracotomy approach   PACEMAKER IMPLANT N/A 09/22/2023   Procedure: PACEMAKER IMPLANT;  Surgeon: Kennyth Chew, MD;  Location: [facility] INVASIVE CV LAB;  Service: Cardiovascular;  Laterality: N/A;   TEE WITHOUT CARDIOVERSION N/A 07/05/2016   Procedure: TRANSESOPHAGEAL ECHOCARDIOGRAM (TEE);  Surgeon: Annabella Scarce, MD;  Location: [facility] ENDOSCOPY;  Service: Cardiovascular;  Laterality: N/A;   TEE WITHOUT CARDIOVERSION N/A 10/14/2016   Procedure: TRANSESOPHAGEAL ECHOCARDIOGRAM (TEE);  Surgeon: Sudie VEAR Laine, MD;  Location: [facility] OR;  Service: Open Heart Surgery;  Laterality: N/A;     Home Medications:  Prior to Admission medications   Medication Sig Start Date End Date Taking? Authorizing Provider  acetaminophen  (TYLENOL ) 500 MG tablet Take 500 mg by mouth every 6 (six) hours as needed for mild pain (pain score 1-3).   Yes [provider]  albuterol  (PROVENTIL ) (2.5 MG/3ML) 0.083% nebulizer solution Take 3 mLs (2.5 mg total) by nebulization every 6 (six) hours as needed (during COPD Exacerbation). 02/15/24 02/14/25 Yes Hope Almarie ORN, NP  albuterol  (VENTOLIN  HFA) 108 (90 Base)  MCG/ACT inhaler INHALE 2 PUFFS INTO THE LUNGS EVERY 4 HOURS AS NEEDED FOR AHEEZING OR SHORTNESS OF BREATH 02/21/24  Yes Parrett, Tammy S, NP  apixaban  (ELIQUIS ) 2.5 MG TABS tablet TAKE ONE TABLET TWICE DAILY (START 09/18/23 OR AS DIRECTED) 10/24/23  Yes Burchette, Wolm ORN, MD  bumetanide  (BUMEX ) 2 MG tablet Take 1 tablet (2 mg total) by mouth daily. Patient taking differently: Take 4 mg by mouth daily. 04/09/24 05/09/24 Yes Swinyer, Rosaline HERO, NP  cholecalciferol (VITAMIN D3) 25 MCG (1000 UNIT) tablet Take 1,000 Units by mouth daily.   Yes [provider]  diazepam  (VALIUM ) 5 MG tablet TAKE ONE TABLET BY MOUTH AT BEDTIME 12/12/23  Yes Burchette, Wolm ORN, MD  estradiol  (ESTRACE ) 0.1 MG/GM vaginal cream Place 1 Applicatorful vaginally See admin instructions. Apply a small amount vaginally three nights a week on Monday, Wednesday and Friday.   Yes [provider]  fluticasone  (FLONASE ) 50 MCG/ACT nasal  spray Place 1 spray into both nostrils daily as needed for allergies or rhinitis. 08/30/22  Yes Sood, Vineet, MD  HYDROcodone -acetaminophen  (NORCO/VICODIN) 5-325 MG tablet TAKE ONE TABLET EVERY 8 HOURS AS NEEDED FOR PAIN 04/01/24  Yes Burchette, Wolm ORN, MD  metolazone  (ZAROXOLYN ) 5 MG tablet TAKE ONE TABLET WEEKLY 30 MINUTES BEFORE BUMEX  DOSE. 04/09/24  Yes Swinyer, Rosaline HERO, NP  mupirocin  ointment (BACTROBAN ) 2 % APPLY TOPICALLY ONCE A DAY 03/16/24  Yes Burchette, Wolm ORN, MD  sacubitril -valsartan  (ENTRESTO ) 24-26 MG Take 1/2 tablet twice a day 04/06/24  Yes Walker, Caitlin S, NP  potassium chloride  (MICRO-K ) 10 MEQ CR capsule Take 80 mEq by mouth daily. Patient not taking: Reported on 04/16/2024    [provider]    Scheduled Meds:  apixaban   2.5 mg Oral BID   guaiFENesin   600 mg Oral BID   mupirocin  cream   Topical Daily   potassium chloride   40 mEq Oral TID   Continuous Infusions:  cefTRIAXone  (ROCEPHIN )  IV 1 g (04/16/24 1143)   PRN Meds: albuterol ,  HYDROcodone -acetaminophen , ipratropium-albuterol   Allergies:    Allergies  Allergen Reactions   Aldactone  [Spironolactone ] Other (See Comments)    Dyspnea    Amoxil  [Amoxicillin ] Palpitations and Other (See Comments)    Tachycardia   Zebeta  [Bisoprolol ] Shortness Of Breath   Calan  [Verapamil ] Other (See Comments)    Myalgias    Crestor  [Rosuvastatin ] Other (See Comments)    Myalgias    Flovent  [Fluticasone  Propionate] Other (See Comments)    Leg cramps   Livalo  [Pitavastatin ] Other (See Comments)    Myalgias    Lyrica [Pregabalin] Swelling   Neurontin [Gabapentin] Swelling   Norvasc  [Amlodipine ] Swelling    Low extremity edema   Zetia  [Ezetimibe ] Other (See Comments)    Myalgias   Zocor  [Simvastatin ] Other (See Comments)    Myalgias    Lotensin [Benazepril] Cough   Polytrim  [Polymyxin B -Trimethoprim ] Swelling    Social History:   Social History   Socioeconomic History   Marital status: Married    Spouse name: Not on file   Number of children: Not on file   Years of education: Not on file   Highest education level: Not on file  Occupational History   Not on file  Tobacco Use   Smoking status: Former    Current packs/day: 0.00    Average packs/day: 1 pack/day for 15.0 years (15.0 ttl pk-yrs)    Types: Cigarettes    Start date: 09/27/1960    Quit date: 09/28/1975    Years since quitting: 48.5   Smokeless tobacco: Never  Vaping Use   Vaping status: Never Used  Substance and Sexual Activity   Alcohol use: No    Alcohol/week: 0.0 standard drinks of alcohol   Drug use: No   Sexual activity: Yes  Other Topics Concern   Not on file  Social History Narrative   Not on file   Social Drivers of Health   Financial Resource Strain: Low Risk  (04/09/2022)   Overall Financial Resource Strain (CARDIA)    Difficulty of Paying Living Expenses: Not hard at all  Food Insecurity: No Food Insecurity (04/13/2024)   Hunger Vital Sign    Worried About Running Out of Food in the  Last Year: Never true    Ran Out of Food in the Last Year: Never true  Transportation Needs: No Transportation Needs (04/13/2024)   PRAPARE - Transportation    Lack of Transportation (Medical): No    Lack of  Transportation (Non-Medical): No  Physical Activity: Insufficiently Active (04/09/2022)   Exercise Vital Sign    Days of Exercise per Week: 5 days    Minutes of Exercise per Session: 20 min  Stress: No Stress Concern Present (04/09/2022)   [employer] of Occupational Health - Occupational Stress Questionnaire    Feeling of Stress : Not at all  Social Connections: Socially Integrated (03/26/2024)   Social Connection and Isolation Panel    Frequency of Communication with Friends and Family: More than three times a week    Frequency of Social Gatherings with Friends and Family: More than three times a week    Attends Religious Services: More than 4 times per year    Active Member of [employer] or Organizations: Yes    Attends Engineer, structural: More than 4 times per year    Marital Status: Married  Catering manager Violence: Not At Risk (04/13/2024)   Humiliation, Afraid, Rape, and Kick questionnaire    Fear of Current or Ex-Partner: No    Emotionally Abused: No    Physically Abused: No    Sexually Abused: No    Family History:   Family History  Problem Relation Age of Onset   Leukemia Father    Breast cancer Neg Hx      ROS:  Please see the history of present illness.   All other ROS reviewed and negative.     Physical Exam/Data: Vitals:   04/16/24 0600 04/16/24 0717 04/16/24 0800 04/16/24 1219  BP: (!) 101/49 105/76 (!) 106/48 (!) 101/55  Pulse: (!) 59 71 60 62  Resp: 15 16 18 18   Temp:  98 F (36.7 C)  97.8 F (36.6 C)  TempSrc:  Oral  Oral  SpO2: 93% 95% 95% 95%  Weight:      Height:        Intake/Output Summary (Last 24 hours) at 04/16/2024 1524 Last data filed at 04/16/2024 1220 Gross per 24 hour  Intake 790 ml  Output --  Net 790 ml       04/15/2024    3:29 PM 04/13/2024    3:59 PM 04/12/2024   10:13 AM  Last 3 Weights  Weight (lbs) 115 lb 117 lb 120 lb 9.6 oz  Weight (kg) 52.164 kg 53.071 kg 54.704 kg     Body mass index is 20.37 kg/m.  General: Frail appearing WF in no acute distress. Head: Normocephalic, atraumatic, sclera non-icteric, no xanthomas, nares are without discharge. Neck: Negative for carotid bruits. JVP not elevated. Lungs: Clear bilaterally to auscultation without wheezes, rales, or rhonchi. Breathing is unlabored. Heart: RRR S1 S2 2/6 SEM without rubs or gallops.  Abdomen: Soft, non-tender, non-distended with normoactive bowel sounds. No rebound/guarding. Extremities: Chronic venous hyperpigmentation changes noted with striations indicating previous more significant edema now improved. Left leg wound is dressed. Neuro: Alert and oriented X 3. Moves all extremities spontaneously. Psych:  Responds to questions appropriately with a normal affect.   EKG:  The EKG was personally reviewed and demonstrates:  possible underlying atrial fibrillation wtih intermittent V pacing 76bpm, nonspecific STTW changes Telemetry:  Telemetry was personally reviewed and demonstrates:  Predominantly AV paced with intermittent episodes of tachycardia suspected possible junctional tachycardia  Relevant CV Studies: 2d echo 11/01/23 jection fraction, by estimation, is 65 to 70%. The  left ventricle has normal function. The left ventricle has no regional  wall motion abnormalities. There is moderate left ventricular hypertrophy.  Left ventricular diastolic  parameters are indeterminate.  2. Right ventricular systolic function is mildly reduced. The right  ventricular size is mildly enlarged. There is mildly elevated pulmonary  artery systolic pressure. The estimated right ventricular systolic  pressure is 37.8 mmHg.   3. Left atrial size was severely dilated.   4. Right atrial size was moderately dilated.   5. The mitral valve  has been repaired/replaced. Trivial mitral valve  regurgitation. The mean mitral valve gradient is 3.6 mmHg. There is a 29  mm Medtronic Mosaic Porcine bioprosthetic valve present in the mitral  position. Procedure Date: 10/14/2016. Echo   findings are consistent with normal structure and function of the mitral  valve prosthesis.   6. Tricuspid valve regurgitation is mild to moderate.   7. The aortic valve is abnormal. There is moderate calcification of the  aortic valve. Aortic valve regurgitation is not visualized. Mild-Moderate  aortic valve stenosis. Aortic valve area, by VTI measures 0.85 cm. Aortic  valve mean gradient measures  15.3 mmHg. Aortic valve Vmax measures 2.74 m/s. Aortic valve acceleration  time measures 90 msec. DVI 0.48.   8. The inferior vena cava is dilated in size with >50% respiratory  variability, suggesting right atrial pressure of 8 mmHg.    Laboratory Data: High Sensitivity Troponin:  No results for input(s): TROPONINIHS in the last 720 hours.   Chemistry Recent Labs  Lab 04/12/24 0837 04/15/24 1604  NA 138 137  K 5.6* 2.8*  CL 102 97*  CO2 23 26  GLUCOSE 80 134*  BUN 24 29*  CREATININE 1.00 1.32*  CALCIUM  9.2 9.2  MG  --  1.8  GFRNONAA  --  39*  ANIONGAP  --  14    Recent Labs  Lab 04/15/24 1604  PROT 5.5*  ALBUMIN  2.8*  AST 36  ALT 10  ALKPHOS 94  BILITOT 0.2   Lipids No results for input(s): CHOL, TRIG, HDL, LABVLDL, LDLCALC, CHOLHDL in the last 168 hours.  Hematology Recent Labs  Lab 04/15/24 1604  WBC 9.5  RBC 4.35  HGB 13.2  HCT 38.5  MCV 88.5  MCH 30.3  MCHC 34.3  RDW 13.1  PLT 146*   Thyroid  No results for input(s): TSH, FREET4 in the last 168 hours.  BNP Recent Labs  Lab 04/15/24 1604  PROBNP 14,671.0*    DDimer No results for input(s): DDIMER in the last 168 hours.  Radiology/Studies:  DG Chest 2 View Result Date: 04/15/2024 CLINICAL DATA:  Short of breath. EXAM: CHEST - 2 VIEW  COMPARISON:  03/16/2024.  CT, 09/10/2023. FINDINGS: Stable changes from previous cardiac surgery. Cardiac silhouette normal in size. Left anterior chest wall dual lead pacemaker is stable and well positioned. No mediastinal or hilar masses. No evidence of adenopathy. Small right pleural effusion. Additional opacity at both lung bases consistent with atelectasis. Chronic bilateral interstitial thickening. No overt pulmonary edema. No pneumothorax. Mild depression of the upper endplate of a lower thoracic vertebra, stable from the prior chest CT. No acute fracture. IMPRESSION: 1. Lung base opacities suspected to be atelectasis, associated with a small right pleural effusion. 2. No convincing pneumonia or pulmonary edema. Electronically Signed   By: Alm Parkins M.D.   On: 04/15/2024 16:41   CUP PACEART INCLINIC DEVICE CHECK Result Date: 04/12/2024 in-clinic  _dual__ chamber pacemaker check. Presenting Rhythm: _VS/tachy c/w SVT__ . auto thresholds, sensing, and impedance demonstrate stable parameters and no programming changes needed at this time.. Estimated longevity _9.6 years___ . Pt enrolled in  remote follow-up. in office with Dr.  Waddell support in/out of felt to be junctional tachycardia. full discussion in office note    Assessment and Plan:  1. Left leg wound with suspected LLE cellulitis - per medicine team - was pending OP referral to ortho for wound care prior to admission  2. Chronic HFpEF with chronic lower extremity edema, AKI with possible baseline CKD stage 2-3a (challenging to know true euvolemic creatinine), hypokalemia - chart well-outlines that volume status has been extremely difficult to control, with edema persisting despite diuretic adjustment, leading to conclusion that significant venous insufficiency also at play. This is supported by resolution of edema with elevation of legs  - has not tolerated compression hose in the past - patient reports varying degrees of Bumex  prior  to admission but settled on that she has taken 2 Bumex  daily for the last 3 days with metolazone  dose on Saturday - now with mild AKI, hypotension, suggesting degree of hypovolemia - difficult to know what to make of pro-BNP, agree with holding diuretics for now - will review further evaluation with MD. For now appears dry but if there are questions about volume status in future, RHC may be helpful - agree with holding Entresto  - some constellation of issues (arrhythmias, LVH, edema) could allude to underlying OSA though not sure she would tolerate workup/mgmt of such  3. H/o PAF, sinus node dysfunction s/p MDT PPM 08/2023 complicated by NSVT, junctional tachycardia - recent TSH wnl - intolerant of beta blockers, verapamil  - EP recently discussed amiodarone  versus ablation and outpatient ablation was planned - potassium being repleted, will give 1g mag sulfate for Mg 1.8 - on Eliquis  2.5mg  BID, dose adjusted for age/weight - will ask EP to see in AM for continued episodes of tachycardia while inpatient, symptomatic with palpitations. Intolerance to medical therapy may make this more challenging  4. CAD s/p prior CABG - hsTroponin mildly elevated, suspect demand ischemia - no ASA given concomitant Eliquis  - hx of statin intolerance well documented  5. MV disease s/p mitral commissurotomy in the 1970s, bioprosthetic MVR 2018 at time of CABG, also mild-moderate AS/TR - echo recently done 10/2023 and reviewed above - per d/w Dr. Loni, no clear findings to suggest amyloid  6. ? Adult failure to thrive - recent trends suggest frequent readmissions, downtrending weight, low albumin , proteinuria - consult dietitian - consider GOC consultation  Risk Assessment/Risk Scores:       New [place]  Heart Association (NYHA) Functional Class NYHA Class III  CHA2DS2-VASc Score = 6   This indicates a 9.7% annual risk of stroke. The patient's score is based upon: CHF History: 1 HTN History:  1 Diabetes History: 0 Stroke History: 0 Vascular Disease History: 1 Age Score: 2 Gender Score: 1        For questions or updates, please contact [facility] Please consult [email or web address] for contact info under    Signed, Raphael LOISE Bring, PA-C  04/16/2024 3:24 PM  Patient seen and examined with Dayna Dunn PA-C.  Agree as above, with the following exceptions and changes as noted below.  Patient has a very complex past medical history but currently presents with evidence of increasing shortness of breath nonproductive cough and increased swelling in her abdomen, with noted left lower extremity wound and cellulitis.  Placed on antibiotics.  Edema was felt to be dependent as it improved with elevation alone, patient has been intolerant to compression stockings in the past and currently has a lower extremity wound on the left.  Due to hypotension she  received some fluids in the ER on presentation, as an outpatient she has been on increasing doses of diuretic with Bumex  and metolazone  at the direction of the cardiology office.  Patient overall is exasperated with multiple hospitalization and just wants to feel better.  She feels that her decline began in December.  Most recent echocardiogram demonstrates preserved LV function, normal mitral valve prosthesis function, mildly reduced RV function with mildly dilated RV and mildly elevated PASP at 38 mmHg, massive dilation of the LA, moderate to severely dilated RA, and likely mild to moderate aortic valve stenosis.  Gen: Thin and frail, CV: RRR, mid peaking systolic murmur across precordium, Lungs: clear, Abd: soft, Extrem: Lower extremities are not edematous, there is venous stasis change and skin wrinkling neuro/Psych: alert and oriented x 3, normal mood and affect. All available labs, radiology testing, previous records reviewed.   Increasing volume overload at home likely multifactorial with low albumin  likely some component of third spacing  and nutritional deficiencies, in combination with dietary indiscretions at home including canned vegetables though she does try to rinse these, or will eat meals that her husband picks up from takeout.  She does try to limit salt intake but feels there is some insidious salt in her diet regardless.  I am quite concerned that she has nutritional deficiencies given that she mostly eats fruits and vegetables with occasional cottage cheese.  Recommend dietitian consult to assist with evidence of low albumin .  Patient also notes that she is frequently nauseated, unclear etiology, may be medication related.  I have discussed in detail with the patient the thought of a palliative care consult for symptom management and goals of care.  She has had repeated hospitalizations for heart failure, she appears thin and frail losing weight, and is not enjoying the quality of life she would like.  While we continue to optimize medical factors, she may benefit from a goals of care discussion.  She mentions that her husband may be starting to develop dementia as well.  All these factors combined, it would be beneficial to define her goals of care and treatment wishes.  Electrophysiology has been involved and with episodes of narrow complex tachycardia while in hospital, they may be able to provide some additional recommendations given her multiple medication intolerances and current wound which may preclude her from in-hospital ablation, we will defer to their judgment.    For now reasonable to hold diuresis as she appears well diuresed and received fluids on presentation.  Diastolic dysfunction is likely a component of her presentations with volume overload but not likely to be the entire reason with probable third spacing from hypoalbuminemia.  At times diuresis may be less effective then compression and elevation.  These are challenging for the patient to do all the time, therefore goals of care and any assistance at home may  be of benefit to the patient.  She currently lives at home independently with her husband but may need additional medical services based on her description of her debility.   Soyla DELENA Merck, MD 04/16/24 4:57 PM

## 2024-04-17 DIAGNOSIS — I5033 Acute on chronic diastolic (congestive) heart failure: Secondary | ICD-10-CM | POA: Diagnosis not present

## 2024-04-17 DIAGNOSIS — Z66 Do not resuscitate: Secondary | ICD-10-CM | POA: Diagnosis not present

## 2024-04-17 DIAGNOSIS — Z7189 Other specified counseling: Secondary | ICD-10-CM | POA: Diagnosis not present

## 2024-04-17 DIAGNOSIS — Z515 Encounter for palliative care: Secondary | ICD-10-CM | POA: Diagnosis not present

## 2024-04-17 LAB — MAGNESIUM: Magnesium: 2 mg/dL (ref 1.7–2.4)

## 2024-04-17 LAB — BASIC METABOLIC PANEL WITH GFR
Anion gap: 8 (ref 5–15)
BUN: 24 mg/dL — ABNORMAL HIGH (ref 8–23)
CO2: 25 mmol/L (ref 22–32)
Calcium: 8.8 mg/dL — ABNORMAL LOW (ref 8.9–10.3)
Chloride: 103 mmol/L (ref 98–111)
Creatinine, Ser: 1.01 mg/dL — ABNORMAL HIGH (ref 0.44–1.00)
GFR, Estimated: 54 mL/min — ABNORMAL LOW (ref >60)
Glucose, Bld: 111 mg/dL — ABNORMAL HIGH (ref 70–99)
Potassium: 4.4 mmol/L (ref 3.5–5.1)
Sodium: 136 mmol/L (ref 135–145)

## 2024-04-17 LAB — CBC
HCT: 30.5 % — ABNORMAL LOW (ref 36.0–46.0)
Hemoglobin: 10.4 g/dL — ABNORMAL LOW (ref 12.0–15.0)
MCH: 30.4 pg (ref 26.0–34.0)
MCHC: 34.1 g/dL (ref 30.0–36.0)
MCV: 89.2 fL (ref 80.0–100.0)
Platelets: 116 K/uL — ABNORMAL LOW (ref 150–400)
RBC: 3.42 MIL/uL — ABNORMAL LOW (ref 3.87–5.11)
RDW: 13.3 % (ref 11.5–15.5)
WBC: 6.1 K/uL (ref 4.0–10.5)
nRBC: 0 % (ref 0.0–0.2)

## 2024-04-17 MED ORDER — HYDROCODONE-ACETAMINOPHEN 5-325 MG PO TABS
1.0000 | ORAL_TABLET | Freq: Once | ORAL | Status: AC
  Administered 2024-04-17: 1 via ORAL

## 2024-04-17 MED ORDER — ENSURE PLUS HIGH PROTEIN PO LIQD
237.0000 mL | Freq: Two times a day (BID) | ORAL | Status: DC
  Administered 2024-04-17 – 2024-04-25 (×14): 237 mL via ORAL

## 2024-04-17 MED ORDER — BOOST PLUS PO LIQD
237.0000 mL | Freq: Two times a day (BID) | ORAL | Status: DC
  Administered 2024-04-17 – 2024-04-24 (×12): 237 mL via ORAL
  Filled 2024-04-17 (×15): qty 237

## 2024-04-17 MED ORDER — HYDROCODONE-ACETAMINOPHEN 5-325 MG PO TABS
1.0000 | ORAL_TABLET | Freq: Four times a day (QID) | ORAL | Status: AC | PRN
  Administered 2024-04-17 – 2024-04-19 (×3): 1 via ORAL
  Filled 2024-04-17 (×4): qty 1

## 2024-04-17 MED ORDER — HYDROCODONE-ACETAMINOPHEN 5-325 MG PO TABS
1.0000 | ORAL_TABLET | Freq: Four times a day (QID) | ORAL | Status: DC | PRN
  Filled 2024-04-17: qty 1

## 2024-04-17 MED ORDER — ONDANSETRON HCL 4 MG/2ML IJ SOLN
4.0000 mg | Freq: Four times a day (QID) | INTRAMUSCULAR | Status: DC | PRN
  Administered 2024-04-17 – 2024-04-25 (×4): 4 mg via INTRAVENOUS
  Filled 2024-04-17 (×4): qty 2

## 2024-04-17 MED ORDER — FLUCONAZOLE 150 MG PO TABS
150.0000 mg | ORAL_TABLET | Freq: Once | ORAL | Status: AC
  Administered 2024-04-17: 150 mg via ORAL
  Filled 2024-04-17: qty 1

## 2024-04-17 MED ORDER — ADULT MULTIVITAMIN W/MINERALS CH
1.0000 | ORAL_TABLET | Freq: Every day | ORAL | Status: DC
  Administered 2024-04-17 – 2024-04-25 (×9): 1 via ORAL
  Filled 2024-04-17 (×9): qty 1

## 2024-04-17 NOTE — Progress Notes (Signed)
 PROGRESS NOTE  Catherine [name] FMW:[id number] DOB: October 23, 1934 DOA: 04/15/2024 PCP: Micheal Wolm ORN, MD  HPI/Recap of past 24 hours: Catherine [name] is a 89 y.o. female with medical history significant of Afib on Coumadin , pacemaker, severe mitral stenosis status post bioprosthetic mitral valve replacement, CAD s/p CABG, HTN, HLD, HFpEF (EF 60-70% in 2025), chronic back pain, COPD and chronic physical debility presented ED complaining of nonproductive cough, worsening SOB, and LLE cellulitis. Pt presented to her cardiologist's office and was evaluated by Charlies Arthur, PA who recommended increasing her Bumex  1--2mg  daily for 3 days. Pt completed her course of increased Bumex  without any improvement prompting an ED visit. In the ED patient noted to be borderline hypotensive otherwise hemodynamically stable. Troponin 135 trended down to 129. Elevated proBNP 14,000. Elevated lactic acid level 2. BMP showing low potassium 2.8, elevated creatinine 1.32. EKG showed atrial fibrillation rate controlled 76. Chest x-ray showing atelectasis, small right pleural effusion. EDP administered Ancef  and Td booster for LLE wound/cellulitis, and 500 mL of NS bolus due to setting of low blood pressure. Cardiology consulted. Pt admitted for further management.   Today, pt reports feeling ok, denies any chest pain, abdominal pain, worsening SOB, fever/chills.    Assessment/Plan: Principal Problem:   Acute exacerbation of CHF (congestive heart failure) (HCC)  Hypotension AKI on CKD stage IIIa Possibly likely from overdiuresis S/p IV fluids in the ED Hold PTA diuretics, Entresto  Strict I's and O's, daily weight Daily BMP  ??Acute on chronic HFpEF Chronic lower extremity edema/possible venous insufficiency Noted elevated proBNP 14,000 Troponin elevated 135--129 Chest x-ray with atelectasis, small right pleural effusion Cardiology on board, hold further diuresis, Entresto  Telemetry   Junctional  tachycardia/SVT History of paroxysmal A-fib Sinus node dysfunction s/p PPM 08/2023 EP consulted, scheduled for ablation in September we will try to get it done sooner in August Recommend low-dose bisoprolol  as BP permits (cardiology/EP to initiate) Continue Eliquis  Telemetry   LLE cellulitis Currently afebrile, with no leukocytosis Continue IV ceftriaxone  WOC consulted   H/o vaginal candidiasis Pt has history of vaginal candidiasis following oral abx Started p.o. fluconazole   CAD s/p prior CABG Denies any chest pain Troponin elevated, possibly demand ischemia History of statin intolerance well-documented Continue Eliquis    Hypokalemia 2/2 overdiuresis Replete as needed   COPD Duonebs prn OP Pulmonology on d/c for medication optimization  ??Adult failure to thrive Low albumin , frequent admission Dietitian consulted Palliative consulted for GOC discussions PT/OT    Estimated body mass index is 20.37 kg/m as calculated from the following:   Height as of this encounter: 5' 3 (1.6 m).   Weight as of this encounter: 52.2 kg.     Code Status: Full  Family Communication: None at bedside  Disposition Plan: Status is: Inpatient Remains inpatient appropriate because: Level of care      Consultants: Cardiology  Procedures: None  Antimicrobials: Ceftriaxone   DVT prophylaxis: Eliquis    Objective: Vitals:   04/17/24 0331 04/17/24 0855 04/17/24 1120 04/17/24 1124  BP: (!) 103/59 (!) 91/48  (!) 92/53  Pulse:  100  (!) 59  Resp: 16 18 17 18   Temp: 98.2 F (36.8 C) 98.6 F (37 C)  98.2 F (36.8 C)  TempSrc: Oral Oral Oral Oral  SpO2:  93%  95%  Weight:      Height:        Intake/Output Summary (Last 24 hours) at 04/17/2024 1310 Last data filed at 04/17/2024 0900 Gross per 24 hour  Intake 320.06 ml  Output --  Net 320.06 ml   Filed Weights   04/15/24 1529  Weight: 52.2 kg    Exam: General: NAD, chronically ill appearing   Cardiovascular: S1,  S2 present Respiratory: Diminished breath sounds bilaterally Abdomen: Soft, nontender, nondistended, bowel sounds present Musculoskeletal: Trace bilateral pedal edema noted Skin: Noted thin skin, easy bruising Psychiatry: Normal mood     Data Reviewed: CBC: Recent Labs  Lab 04/15/24 1604 04/17/24 0531  WBC 9.5 6.1  HGB 13.2 10.4*  HCT 38.5 30.5*  MCV 88.5 89.2  PLT 146* 116*   Basic Metabolic Panel: Recent Labs  Lab 04/12/24 0837 04/15/24 1604 04/17/24 0531  NA 138 137 136  K 5.6* 2.8* 4.4  CL 102 97* 103  CO2 23 26 25   GLUCOSE 80 134* 111*  BUN 24 29* 24*  CREATININE 1.00 1.32* 1.01*  CALCIUM  9.2 9.2 8.8*  MG  --  1.8 2.0   GFR: Estimated Creatinine Clearance: 32.9 mL/min (A) (by C-G formula based on SCr of 1.01 mg/dL (H)). Liver Function Tests: Recent Labs  Lab 04/15/24 1604  AST 36  ALT 10  ALKPHOS 94  BILITOT 0.2  PROT 5.5*  ALBUMIN  2.8*   No results for input(s): LIPASE, AMYLASE in the last 168 hours. No results for input(s): AMMONIA in the last 168 hours. Coagulation Profile: No results for input(s): INR, PROTIME in the last 168 hours. Cardiac Enzymes: No results for input(s): CKTOTAL, CKMB, CKMBINDEX, TROPONINI in the last 168 hours. BNP (last 3 results) Recent Labs    03/24/24 2240 04/15/24 1604  PROBNP 5,712.0* 14,671.0*   HbA1C: No results for input(s): HGBA1C in the last 72 hours. CBG: No results for input(s): GLUCAP in the last 168 hours. Lipid Profile: No results for input(s): CHOL, HDL, LDLCALC, TRIG, CHOLHDL, LDLDIRECT in the last 72 hours. Thyroid  Function Tests: No results for input(s): TSH, T4TOTAL, FREET4, T3FREE, THYROIDAB in the last 72 hours. Anemia Panel: No results for input(s): VITAMINB12, FOLATE, FERRITIN, TIBC, IRON , RETICCTPCT in the last 72 hours. Urine analysis:    Component Value Date/Time   COLORURINE YELLOW 04/15/2024 1640   APPEARANCEUR CLEAR  04/15/2024 1640   APPEARANCEUR Cloudy (A) 07/22/2022 1513   LABSPEC 1.013 04/15/2024 1640   PHURINE 6.0 04/15/2024 1640   GLUCOSEU NEGATIVE 04/15/2024 1640   HGBUR SMALL (A) 04/15/2024 1640   BILIRUBINUR NEGATIVE 04/15/2024 1640   BILIRUBINUR positive 07/11/2023 1453   BILIRUBINUR Negative 07/22/2022 1513   KETONESUR NEGATIVE 04/15/2024 1640   PROTEINUR >300 (A) 04/15/2024 1640   UROBILINOGEN 1.0 07/11/2023 1453   UROBILINOGEN 0.2 03/15/2015 2151   NITRITE NEGATIVE 04/15/2024 1640   LEUKOCYTESUR NEGATIVE 04/15/2024 1640   Sepsis Labs: @LABRCNTIP (procalcitonin:4,lacticidven:4)  ) Recent Results (from the past 240 hours)  Culture, blood (Routine X 2) w Reflex to ID Panel     Status: None (Preliminary result)   Collection Time: 04/16/24  9:55 AM   Specimen: BLOOD RIGHT ARM  Result Value Ref Range Status   Specimen Description BLOOD RIGHT ARM  Final   Special Requests   Final    BOTTLES DRAWN AEROBIC ONLY Blood Culture results may not be optimal due to an inadequate volume of blood received in culture bottles   Culture   Final    NO GROWTH < 24 HOURS Performed at [facility], [address], [place], [place] [address]    Report Status PENDING  Incomplete  Culture, blood (Routine X 2) w Reflex to ID Panel     Status:  None (Preliminary result)   Collection Time: 04/16/24  9:55 AM   Specimen: BLOOD LEFT HAND  Result Value Ref Range Status   Specimen Description BLOOD LEFT HAND  Final   Special Requests   Final    BOTTLES DRAWN AEROBIC ONLY Blood Culture results may not be optimal due to an inadequate volume of blood received in culture bottles   Culture   Final    NO GROWTH < 24 HOURS Performed at [facility], [address], [place], [place] [address]    Report Status PENDING  Incomplete      Studies: No results found.  Scheduled Meds:  apixaban   2.5 mg Oral BID   fluconazole   150 mg Oral Once   guaiFENesin   600 mg Oral BID   mupirocin  cream   Topical  Daily    Continuous Infusions:  cefTRIAXone  (ROCEPHIN )  IV 1 g (04/17/24 1028)     LOS: 1 day     Lebron JINNY Cage, MD [facility]  If 7PM-7AM, please contact night-coverage [email or web address] 04/17/2024, 1:10 PM

## 2024-04-17 NOTE — Plan of Care (Signed)

## 2024-04-17 NOTE — Progress Notes (Signed)
 Initial Nutrition Assessment  DOCUMENTATION CODES:   Severe malnutrition in context of chronic illness  INTERVENTION:  -Continue 2g Na+ menu, regular texture, thin liquids -Add Ensure Plus High Protein BID, Boost BID as pt likes to mix them -Add Magic Cup BID -Add MVI w/min -Ordered Zinc  lab d/t taste disturbances   NUTRITION DIAGNOSIS:   Severe Malnutrition related to nausea, poor appetite, chronic illness, wound healing as evidenced by percent weight loss, severe fat depletion, severe muscle depletion, energy intake < or equal to 75% for > or equal to 1 month, edema.  GOAL:   Patient will meet greater than or equal to 90% of their needs   MONITOR:   PO intake, Supplement acceptance, Labs, Weight trends, Skin  REASON FOR ASSESSMENT:   Consult Assessment of nutrition requirement/status  ASSESSMENT:   Hx atrial fibrillation on Coumadin , pacemaker, severe mitral stenosis status post bioprosthetic mitral valve replacement, coronary artery disease s/p CABG, essential hypertension, hyperlipidemia, HFpEF (EF 60-70% in 2025), chronic back pain, COPD and chronic physical debility  Spoke to pt in room. Pt sitting in chair with lunch tray, mostly untouched. Pt states poor appetite, food just doesn't taste good. Pt does also endorse nausea at baseline, no outpatient antiemetics. No noted v/c/d or chewing/swallowing difficulties. Last BM 7/21. Pt focused on low salt recommendation, discussed balance between salt/fluid recommendations and needing to consume adequate kcal/pro. Discussed small frequent meals, fortifying foods. Pt verbalized understanding. Pt like Boost/Ensure mixed together, ordered BID. Pt also willing to try Magic Cup BID. Ordered Zinc  lab to access deficiency with stated taste disturbances. Pt states significant weight loss of 30 lbs (21%) since her pacemaker procedure in late December 24' (7 months). Documented weights show weight in Dec ~130 lbs, 15 lb loss (11.5%), which  is still severe. NPFE performed (see below), pt meets criteria for severe protein calorie malnutrition. Pt denies additional questions/concerns at this time, will continue to monitor, RDN available prn.   Labs BG 111-134 BUN 24 Cr 1.01 Calcium  8.8 Albumin  2.8 GFR 54 H/H 10.4/30.5   Medications  apixaban   2.5 mg Oral BID   feeding supplement  237 mL Oral BID BM   guaiFENesin   600 mg Oral BID   lactose free nutrition  237 mL Oral BID BM   multivitamin with minerals  1 tablet Oral Daily   mupirocin  cream   Topical Daily    NUTRITION - FOCUSED PHYSICAL EXAM:  Flowsheet Row Most Recent Value  Orbital Region Severe depletion  Upper Arm Region Severe depletion  Thoracic and Lumbar Region Severe depletion  Buccal Region Severe depletion  Temple Region Severe depletion  Clavicle Bone Region Severe depletion  Clavicle and Acromion Bone Region Severe depletion  Scapular Bone Region Severe depletion  Dorsal Hand Severe depletion  Patellar Region Severe depletion  Anterior Thigh Region Severe depletion  Posterior Calf Region Severe depletion  Edema (RD Assessment) Mild  Hair Reviewed  Eyes Reviewed  Mouth Reviewed  Skin Reviewed  Nails Reviewed    Diet Order:   Diet Order             Diet 2 gram sodium Fluid consistency: Thin; Fluid restriction: 1500 mL Fluid  Diet effective now                   EDUCATION NEEDS:   Education needs have been addressed  Skin:  Skin Assessment: Skin Integrity Issues: Skin Integrity Issues:: DTI, Other (Comment) DTI: Saccrum Other: Traumatic injury to LLE, left chest  Last  BM:  7/21  Height:   Ht Readings from Last 1 Encounters:  04/15/24 5' 3 (1.6 m)    Weight:   Wt Readings from Last 1 Encounters:  04/15/24 52.2 kg    BMI:  Body mass index is 20.37 kg/m.  Estimated Nutritional Needs:   Kcal:  1300-1575 kcal  Protein:  65-80g  Fluid:  1500 ml fluid rest   Shailey Butterbaugh Daml-Budig, RDN, LDN Registered Dietitian  Nutritionist RD Inpatient Contact Info in [place]

## 2024-04-17 NOTE — Consult Note (Signed)
 Palliative Medicine Inpatient Consult Note  Consulting Provider:  Donnamarie Lebron PARAS, MD   Reason for consult:   Palliative Care Consult Services Palliative Medicine Consult  Reason for Consult? GOC discussion. MUltiple hospitalization, CHF   04/17/2024  HPI:  Per intake H&P --> Catherine Catherine [name] is a 89 y.o. female with medical history significant of Afib on Coumadin , pacemaker, severe mitral stenosis status post bioprosthetic mitral valve replacement, CAD s/p CABG, HTN, HLD, HFpEF (EF 60-70% in 2025), chronic back pain, COPD and chronic physical debility.  She has been admitted for shortness of breath due to heart failure exacerbation and left lower extremity cellulitis.  Given Catherine Catherine [name]'s recurrent rehospitalizations palliative care consultation has been requested.  Clinical Assessment/Goals of Care:  *Please note that this is a verbal dictation therefore any spelling or grammatical errors are due to the Dragon Medical One system interpretation.  Catherine [name] have reviewed medical records including EPIC notes, labs and imaging, received report from bedside RN, assessed the patient who is mobilizing around the room finally resting in the recliner chair.  Nursing elevated her legs for comfort.   Catherine [name] met with Catherine Catherine [name] to further discuss diagnosis prognosis, GOC, EOL wishes, disposition and options.   Catherine [name] introduced Palliative Medicine as specialized medical care for people living with serious illness. It focuses on providing relief from the symptoms and stress of a serious illness. The goal is to improve quality of life for both the patient and the family.  Medical History Review and Understanding:  A review of Catherine [name]'s past medical history significant for coronary artery disease, hypertension, hyperlipidemia, diastolic heart failure, chronic back pain, COPD, atrial fibrillation, and junctional tachycardia was completed  Social History:  Catherine Catherine [name] Catherine [name] that she is from [facility] where she has  lived throughout the duration of her life.  She has been married to her husband for the past 69 years and this upcoming September.  She has 1 daughter 3 granddaughter and multiple great-grandchildren.  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  She is a woman of the [employer].  Functional and Nutritional State:  Preceding hospitalization Catherine Catherine [name] was able to mobilize around her home, bathe, dress, and feed herself.  She and her husband get food from restaurants or people bring foods that are easy to heat up for them.  Catherine [name] does notice significant decline in appetite.  Advance Directives:  A detailed discussion was had today regarding advanced directives.  Catherine Catherine [name] Catherine [name] that her husband would be her surrogate decision maker if she cannot speak for self.  She does have her last living Catherine scanned into the [facility].  Code Status:  Concepts specific to code status, artifical feeding and hydration, continued IV antibiotics and rehospitalization was had.  The difference between a aggressive medical intervention path  and a palliative comfort care path for this patient at this time was had.   Catherine Catherine [name] and Catherine [name] discussed cardiopulmonary resuscitation status she Catherine [name] she would not want chest compressions, shocks, or intubation/mechanical ventilatory support.  If she were to die she would want to allow for that to be a natural death.  Discussion:  Catherine Catherine [name] reviewed her recurrent rehospitalization's.  She Catherine [name] that since her pacemaker placement in December 2024 she has had for the last 7 months little to no energy.  She feels like she is gone down since that surgery.  Per what Catherine Catherine [name] Catherine [name] with me she  has met with her cardiologist multiple times who feels that at times her heart rate is too rapid leading to her lethargy and shortness of breath.  Catherine Catherine [name] is awaiting a an ablation procedure which is scheduled in  September.  She worries if this is not done sooner she Catherine continue to be rehospitalized.  Catherine Catherine [name] and Catherine [name] discussed heart failure and the progressive nature of the disease.  We reviewed that even with appropriate medical management, dietary adherence, and compliance this disease can still progress.  We reviewed the various classifications through the [place]  Heart Association classification system and when individuals get to the later stages of the disease.   Catherine Catherine [name] Catherine [name] that she has had increasing fatigue with activities, though she suspects a lot of this is related to her increased heart rate at those times.  We discussed if she is having more bad days than good days and/or if she continues to decline despite treatments the idea of hospice care.  Catherine Catherine [name] Catherine [name] understanding and has known individuals on hospice.  She says to me that her goal right now is to have more time with her great-grandchildren 1 she has not met yet and 1 is on the way.  She notes that she continues to be optimistic but understands that she is not in control at the end of the day.  Discussed the importance of continued conversation with family and their  medical providers regarding overall plan of care and treatment options, ensuring decisions are within the context of the patients values and GOCs.  Decision Maker: Catherine Catherine [name],Catherine Catherine [name] (Spouse): [phone] (Mobile)   SUMMARY OF RECOMMENDATIONS   DNAR/DNI  Open and honest conversations held in the setting of Catherine Catherine [name]'s heart failure and recurrent rehospitalization's  Catherine Catherine [name] that her goals are to get her ablation procedure to see if that helps her symptom burden  Catherine Catherine [name] would like to spend more time with her great-grandchildren if at all possible  Ongoing palliative care support  Code Status/Advance Care Planning: DNAR/DNI  Palliative Prophylaxis:  Aspiration, Bowel Regimen, Delirium Protocol, Frequent Pain Assessment, Oral Care, Palliative Wound Care, and Turn  Reposition  Additional Recommendations (Limitations, Scope, Preferences): Continue present care  Psycho-social/Spiritual:  Desire for further Chaplaincy support: Patient has [facility] she is open to spiritual support Additional Recommendations:  Education on chronic disease burden   Prognosis: Recurrent rehospitalization's, adult failure to thrive, high chronic disease burden, 70-month mortality risk increased  Discharge Planning: Discharge plan to be determined.  Vitals:   04/17/24 1120 04/17/24 1124  BP:  (!) 92/53  Pulse:  (!) 59  Resp: 17 18  Temp:  98.2 F (36.8 C)  SpO2:  95%    Intake/Output Summary (Last 24 hours) at 04/17/2024 1537 Last data filed at 04/17/2024 1339 Gross per 24 hour  Intake 440.06 ml  Output --  Net 440.06 ml   Last Weight  Most recent update: 04/15/2024  3:29 PM    Weight  52.2 kg (115 lb)            Gen: Elderly Caucasian female chronically ill-appearing HEENT: moist mucous membranes CV: Regular rate and rhythm  PULM: On room air breathing is even and unlabored -gets the breath in conversation ABD: soft/nontender  EXT: Bilateral lower extremity edema edema Neuro: Alert and oriented x3  PPS: 50-60%   This conversation/these recommendations were discussed with patient primary care team, Dr. Donnamarie ______________________________________________________ Catherine Catherine [name] [facility] Palliative Medicine Team Team Cell Phone: [phone] Please utilize secure chat with additional questions, if  there is no response within 30 minutes please call the above phone number  Total Time: 75 Billing based on MDM: High  Palliative Medicine Team providers are available by phone from 7am to 7pm daily and can be reached through the team cell phone.  Should this patient require assistance outside of these hours, please call the patient's attending physician.

## 2024-04-17 NOTE — Progress Notes (Signed)
 Progress Note  Patient Name: Catherine [name] Date of Encounter: 04/17/2024  Primary Cardiologist: Annabella Scarce, MD   Subjective   No chest pain. Dyspnea is better.   Inpatient Medications    Scheduled Meds:  apixaban   2.5 mg Oral BID   guaiFENesin   600 mg Oral BID   mupirocin  cream   Topical Daily   Continuous Infusions:  cefTRIAXone  (ROCEPHIN )  IV Stopped (04/16/24 1214)   PRN Meds: albuterol , HYDROcodone -acetaminophen , ipratropium-albuterol    Vital Signs    Vitals:   04/16/24 1615 04/16/24 2019 04/16/24 2324 04/17/24 0331  BP: 103/79 (!) 95/52 (!) 120/59 (!) 103/59  Pulse: 90     Resp: 15 16 17 16   Temp: 97.6 F (36.4 C) 98.4 F (36.9 C) 98.1 F (36.7 C) 98.2 F (36.8 C)  TempSrc: Oral Oral Oral Oral  SpO2: 94%     Weight:      Height:        Intake/Output Summary (Last 24 hours) at 04/17/2024 0856 Last data filed at 04/16/2024 1950 Gross per 24 hour  Intake 440.06 ml  Output --  Net 440.06 ml   Filed Weights   04/15/24 1529  Weight: 52.2 kg    Telemetry    Mostly atrial pacing, rare SVT - Personally Reviewed  ECG    none - Personally Reviewed  Physical Exam   GEN: No acute distress.   Neck: No JVD Cardiac: RRR, no murmurs, rubs, or gallops.  Respiratory: Clear to auscultation bilaterally. GI: Soft, nontender, non-distended  MS: 2+ peripheral edema; No deformity. Neuro:  Nonfocal  Psych: Normal affect   Labs    Chemistry Recent Labs  Lab 04/12/24 0837 04/15/24 1604 04/17/24 0531  NA 138 137 136  K 5.6* 2.8* 4.4  CL 102 97* 103  CO2 23 26 25   GLUCOSE 80 134* 111*  BUN 24 29* 24*  CREATININE 1.00 1.32* 1.01*  CALCIUM  9.2 9.2 8.8*  PROT  --  5.5*  --   ALBUMIN   --  2.8*  --   AST  --  36  --   ALT  --  10  --   ALKPHOS  --  94  --   BILITOT  --  0.2  --   GFRNONAA  --  39* 54*  ANIONGAP  --  14 8     Hematology Recent Labs  Lab 04/15/24 1604 04/17/24 0531  WBC 9.5 6.1  RBC 4.35 3.42*  HGB 13.2 10.4*  HCT  38.5 30.5*  MCV 88.5 89.2  MCH 30.3 30.4  MCHC 34.3 34.1  RDW 13.1 13.3  PLT 146* 116*    Cardiac EnzymesNo results for input(s): TROPONINI in the last 168 hours. No results for input(s): TROPIPOC in the last 168 hours.   BNP Recent Labs  Lab 04/15/24 1604  PROBNP 14,671.0*     DDimer No results for input(s): DDIMER in the last 168 hours.   Radiology    DG Chest 2 View Result Date: 04/15/2024 CLINICAL DATA:  Short of breath. EXAM: CHEST - 2 VIEW COMPARISON:  03/16/2024.  CT, 09/10/2023. FINDINGS: Stable changes from previous cardiac surgery. Cardiac silhouette normal in size. Left anterior chest wall dual lead pacemaker is stable and well positioned. No mediastinal or hilar masses. No evidence of adenopathy. Small right pleural effusion. Additional opacity at both lung bases consistent with atelectasis. Chronic bilateral interstitial thickening. No overt pulmonary edema. No pneumothorax. Mild depression of the upper endplate of a lower thoracic vertebra, stable from  the prior chest CT. No acute fracture. IMPRESSION: 1. Lung base opacities suspected to be atelectasis, associated with a small right pleural effusion. 2. No convincing pneumonia or pulmonary edema. Electronically Signed   By: Alm Parkins M.D.   On: 04/15/2024 16:41    Cardiac Studies   none  Patient Profile     89 y.o. female admitted with worsening sob and lower extremity cellulitis  A/P SVT/Junctional tachy - she is scheduled for ablation in September. Patient would like to get done sooner but schedule limiting. I'll look to see if she can get done in August. For now would give low dose bisoprolol  and uptitrate as/ if bp allows and once volume overload resolves. Acute on chronic diastolic heart failure - her dyspnea is improved. Probnp was elevated. IV lasix .  PPM -her current device is working normally. We will follow.    For questions or updates, please contact [facility] Please consult  [email or web address] for contact info under Cardiology/STEMI.      Signed, Danelle Birmingham, MD  04/17/2024, 8:56 AM

## 2024-04-18 ENCOUNTER — Telehealth: Payer: Self-pay | Admitting: Orthopedic Surgery

## 2024-04-18 DIAGNOSIS — Z515 Encounter for palliative care: Secondary | ICD-10-CM

## 2024-04-18 DIAGNOSIS — I87332 Chronic venous hypertension (idiopathic) with ulcer and inflammation of left lower extremity: Secondary | ICD-10-CM | POA: Diagnosis not present

## 2024-04-18 DIAGNOSIS — Z7189 Other specified counseling: Secondary | ICD-10-CM | POA: Diagnosis not present

## 2024-04-18 LAB — BASIC METABOLIC PANEL WITH GFR
Anion gap: 9 (ref 5–15)
BUN: 25 mg/dL — ABNORMAL HIGH (ref 8–23)
CO2: 24 mmol/L (ref 22–32)
Calcium: 8.9 mg/dL (ref 8.9–10.3)
Chloride: 102 mmol/L (ref 98–111)
Creatinine, Ser: 1.05 mg/dL — ABNORMAL HIGH (ref 0.44–1.00)
GFR, Estimated: 52 mL/min — ABNORMAL LOW (ref >60)
Glucose, Bld: 89 mg/dL (ref 70–99)
Potassium: 4.5 mmol/L (ref 3.5–5.1)
Sodium: 135 mmol/L (ref 135–145)

## 2024-04-18 LAB — CBC
HCT: 29.9 % — ABNORMAL LOW (ref 36.0–46.0)
Hemoglobin: 10 g/dL — ABNORMAL LOW (ref 12.0–15.0)
MCH: 30.6 pg (ref 26.0–34.0)
MCHC: 33.4 g/dL (ref 30.0–36.0)
MCV: 91.4 fL (ref 80.0–100.0)
Platelets: 113 K/uL — ABNORMAL LOW (ref 150–400)
RBC: 3.27 MIL/uL — ABNORMAL LOW (ref 3.87–5.11)
RDW: 13.4 % (ref 11.5–15.5)
WBC: 5.7 K/uL (ref 4.0–10.5)
nRBC: 0 % (ref 0.0–0.2)

## 2024-04-18 LAB — LACTIC ACID, PLASMA: Lactic Acid, Venous: 1 mmol/L (ref 0.5–1.9)

## 2024-04-18 MED ORDER — MIDODRINE HCL 5 MG PO TABS
5.0000 mg | ORAL_TABLET | Freq: Two times a day (BID) | ORAL | Status: DC
  Administered 2024-04-18 – 2024-04-25 (×15): 5 mg via ORAL
  Filled 2024-04-18 (×15): qty 1

## 2024-04-18 MED ORDER — FLUCONAZOLE 100 MG PO TABS
100.0000 mg | ORAL_TABLET | Freq: Once | ORAL | Status: AC
  Administered 2024-04-18: 100 mg via ORAL
  Filled 2024-04-18 (×2): qty 1

## 2024-04-18 MED ORDER — FUROSEMIDE 10 MG/ML IJ SOLN
20.0000 mg | Freq: Two times a day (BID) | INTRAMUSCULAR | Status: AC
  Administered 2024-04-18 (×2): 20 mg via INTRAVENOUS
  Filled 2024-04-18 (×2): qty 2

## 2024-04-18 NOTE — Progress Notes (Signed)
 PROGRESS NOTE    Catherine [name]  FMW:[id number] DOB: [birth year] DOA: 04/15/2024 PCP: Micheal Wolm ORN, MD   88/F with history of SVT, junctional tachycardia, due for ablation in September, paroxysmal A-fib on Coumadin , bioprosthetic MVR, CABG, diastolic CHF, COPD, chronic back pain, debility presented to the ED with cough, worsening shortness of breath and edema. -Recently seen in cards clinic, Bumex  dose increased for 3 days without improvement  -In the ER blood pressure borderline, troponins 135, proBNP 1400, creatinine 1.3, Chest x-ray showing atelectasis, small right pleural effusion. - Cards, EP following   Subjective: Waiting to hear from EP, some shortness of breath with activity - Dr. Kathalene notes reviewed from yesterday  Assessment and Plan:  Acute on chronic HFpEF Hypoalbuminemia and third spacing also contributing Noted elevated proBNP 14,000 -Appears volume overloaded restart IV Lasix  today, GDMT limited by hypotension -Add midodrine   Hypotension AKI on CKD stage IIIa -Creatinine back to baseline, patient reports chronic hypotension -Add low-dose midodrine    Junctional tachycardia/SVT History of paroxysmal A-fib Sinus node dysfunction s/p PPM 08/2023 EP consulted, scheduled for ablation in September  - Now preponed to later this week -Eliquis     ?  UTI Currently afebrile, with no leukocytosis -She denies any symptoms, DC antibiotics and monitor   H/o vaginal candidiasis Fluconazole    CAD s/p prior CABG Denies any chest pain Troponin elevated, possibly demand ischemia History of statin intolerance well-documented Continue Eliquis    Hypokalemia 2/2 overdiuresis Replete as needed   COPD Duonebs prn   ??Adult failure to thrive Low albumin , frequent admission Dietitian consulted Palliative consulted for GOC discussions, now DNR PT/OT     Estimated body mass index is 20.37 kg/m as calculated from the following:   Height as of this  encounter: 5' 3 (1.6 m).   Weight as of this encounter: 52.2 kg.      Code Status: Full   Family Communication: None at bedside    DVT prophylaxis: Eliquis  Code Status: DNR Family Communication: None present Disposition Plan: Home pending above workup  Consultants: Cards, EP, palliative care   Objective: Vitals:   04/17/24 2325 04/18/24 0431 04/18/24 0756 04/18/24 1134  BP:  (!) 93/51 101/68 (!) 113/50  Pulse:  (!) 59 71 (!) 59  Resp: 16 16 16 16   Temp: 98.5 F (36.9 C) 98.6 F (37 C) 98.6 F (37 C) 98.6 F (37 C)  TempSrc: Oral Oral Oral Oral  SpO2:  99% 99% 99%  Weight:      Height:        Intake/Output Summary (Last 24 hours) at 04/18/2024 1158 Last data filed at 04/18/2024 1100 Gross per 24 hour  Intake 960 ml  Output 200 ml  Net 760 ml   Filed Weights   04/15/24 1529  Weight: 52.2 kg    Examination:  General exam: Chronically ill elderly, sitting up in bed, AAO x 3 HEENT: Positive JVD CVs: S1-S2, irregular rhythm Lungs: Decreased breath sounds to bases Abdomen: Soft, nontender, bowel sounds present Extremities: 1+ edema, chronic skin changes  Skin: No rashes Psychiatry:  Mood & affect appropriate.     Data Reviewed:   CBC: Recent Labs  Lab 04/15/24 1604 04/17/24 0531 04/18/24 0453  WBC 9.5 6.1 5.7  HGB 13.2 10.4* 10.0*  HCT 38.5 30.5* 29.9*  MCV 88.5 89.2 91.4  PLT 146* 116* 113*   Basic Metabolic Panel: Recent Labs  Lab 04/12/24 0837 04/15/24 1604 04/17/24 0531 04/18/24 0453  NA 138 137 136 135  K 5.6*  2.8* 4.4 4.5  CL 102 97* 103 102  CO2 23 26 25 24   GLUCOSE 80 134* 111* 89  BUN 24 29* 24* 25*  CREATININE 1.00 1.32* 1.01* 1.05*  CALCIUM  9.2 9.2 8.8* 8.9  MG  --  1.8 2.0  --    GFR: Estimated Creatinine Clearance: 31.7 mL/min (A) (by C-G formula based on SCr of 1.05 mg/dL (H)). Liver Function Tests: Recent Labs  Lab 04/15/24 1604  AST 36  ALT 10  ALKPHOS 94  BILITOT 0.2  PROT 5.5*  ALBUMIN  2.8*   No results  for input(s): LIPASE, AMYLASE in the last 168 hours. No results for input(s): AMMONIA in the last 168 hours. Coagulation Profile: No results for input(s): INR, PROTIME in the last 168 hours. Cardiac Enzymes: No results for input(s): CKTOTAL, CKMB, CKMBINDEX, TROPONINI in the last 168 hours. BNP (last 3 results) Recent Labs    03/24/24 2240 04/15/24 1604  PROBNP 5,712.0* 14,671.0*   HbA1C: No results for input(s): HGBA1C in the last 72 hours. CBG: No results for input(s): GLUCAP in the last 168 hours. Lipid Profile: No results for input(s): CHOL, HDL, LDLCALC, TRIG, CHOLHDL, LDLDIRECT in the last 72 hours. Thyroid  Function Tests: No results for input(s): TSH, T4TOTAL, FREET4, T3FREE, THYROIDAB in the last 72 hours. Anemia Panel: No results for input(s): VITAMINB12, FOLATE, FERRITIN, TIBC, IRON , RETICCTPCT in the last 72 hours. Urine analysis:    Component Value Date/Time   COLORURINE YELLOW 04/15/2024 1640   APPEARANCEUR CLEAR 04/15/2024 1640   APPEARANCEUR Cloudy (A) 07/22/2022 1513   LABSPEC 1.013 04/15/2024 1640   PHURINE 6.0 04/15/2024 1640   GLUCOSEU NEGATIVE 04/15/2024 1640   HGBUR SMALL (A) 04/15/2024 1640   BILIRUBINUR NEGATIVE 04/15/2024 1640   BILIRUBINUR positive 07/11/2023 1453   BILIRUBINUR Negative 07/22/2022 1513   KETONESUR NEGATIVE 04/15/2024 1640   PROTEINUR >300 (A) 04/15/2024 1640   UROBILINOGEN 1.0 07/11/2023 1453   UROBILINOGEN 0.2 03/15/2015 2151   NITRITE NEGATIVE 04/15/2024 1640   LEUKOCYTESUR NEGATIVE 04/15/2024 1640   Sepsis Labs: @LABRCNTIP (procalcitonin:4,lacticidven:4)  ) Recent Results (from the past 240 hours)  Culture, blood (Routine X 2) w Reflex to ID Panel     Status: None (Preliminary result)   Collection Time: 04/16/24  9:55 AM   Specimen: BLOOD RIGHT ARM  Result Value Ref Range Status   Specimen Description BLOOD RIGHT ARM  Final   Special Requests   Final    BOTTLES  DRAWN AEROBIC ONLY Blood Culture results may not be optimal due to an inadequate volume of blood received in culture bottles   Culture   Final    NO GROWTH 2 DAYS Performed at [facility], [address] [place], [place] [address]    Report Status PENDING  Incomplete  Culture, blood (Routine X 2) w Reflex to ID Panel     Status: None (Preliminary result)   Collection Time: 04/16/24  9:55 AM   Specimen: BLOOD LEFT HAND  Result Value Ref Range Status   Specimen Description BLOOD LEFT HAND  Final   Special Requests   Final    BOTTLES DRAWN AEROBIC ONLY Blood Culture results may not be optimal due to an inadequate volume of blood received in culture bottles   Culture   Final    NO GROWTH 2 DAYS Performed at [facility], [address], [place], [place] [address]    Report Status PENDING  Incomplete     Radiology Studies: No results found.  Scheduled Meds:  apixaban   2.5 mg Oral BID   feeding supplement  237 mL Oral BID BM   fluconazole   100 mg Oral Once   furosemide   20 mg Intravenous BID   guaiFENesin   600 mg Oral BID   lactose free nutrition  237 mL Oral BID BM   midodrine   5 mg Oral BID WC   multivitamin with minerals  1 tablet Oral Daily   mupirocin  cream   Topical Daily   Continuous Infusions:   LOS: 2 days    Time spent:    Sigurd Pac, MD [facility]   04/18/2024, 11:58 AM

## 2024-04-18 NOTE — Telephone Encounter (Signed)
 This pt was on the sch new referral for tomorrow and she is currently in the hospital and the family would like to know if you can see her while she is there.

## 2024-04-18 NOTE — Progress Notes (Addendum)
 Palliative Medicine Inpatient Follow Up Note HPI: Catherine [name] is a 89 y.o. female with medical history significant of Afib on Coumadin , pacemaker, severe mitral stenosis status post bioprosthetic mitral valve replacement, CAD s/p CABG, HTN, HLD, HFpEF (EF 60-70% in 2025), chronic back pain, COPD and chronic physical debility.  She has been admitted for shortness of breath due to heart failure exacerbation and left lower extremity cellulitis.  Given Catherine [name]'s recurrent rehospitalizations palliative care consultation has been requested.   Today's Discussion 04/18/2024  *Please note that this is a verbal dictation therefore any spelling or grammatical errors are due to the Dragon Medical One system interpretation.  Chart reviewed inclusive of vital signs, progress notes, laboratory results, and diagnostic images.   I met with Catherine [name] this morning. She shares she just got to the chair from the restroom so is feeling winded. We discussed her concerns if she neglects to get a cardiac ablation - she worried that she will continue to be readmitted to the hospital. She shares she is willing to have it done if there is an available cardiologist willing to do it. I shared that I would inform the primary and cardiology teams of this.   We reviewed again best case and worst care scenarios should the ablation work or not work. Patient is understanding of her current situation though shares that she's thankful for everyday. We discussed if she should neglect to improve in the long run the idea of hospice support.    Created space and opportunity for patient to explore thoughts feelings and fears regarding current medical situation. She endorses the goal of feeling better. She reflects on how since her PM placement she has felt so poorly. Allowed her time to expresses herself, offered support through reflective listening.   Questions and concerns addressed/Palliative Support Provided.   Objective  Assessment: Vital Signs Vitals:   04/18/24 0431 04/18/24 0756  BP: (!) 93/51 101/68  Pulse: (!) 59 71  Resp: 16 16  Temp: 98.6 F (37 C) 98.6 F (37 C)  SpO2: 99% 99%    Intake/Output Summary (Last 24 hours) at 04/18/2024 1110 Last data filed at 04/18/2024 0900 Gross per 24 hour  Intake 960 ml  Output --  Net 960 ml   Last Weight  Most recent update: 04/15/2024  3:29 PM    Weight  52.2 kg (115 lb)            Gen: Elderly Caucasian female chronically ill-appearing HEENT: moist mucous membranes CV: Regular rate and rhythm  PULM: On room air breathing is even and unlabored -gets the breath in conversation ABD: soft/nontender  EXT: Bilateral lower extremity edema edema Neuro: Alert and oriented x3  SUMMARY OF RECOMMENDATIONS   DNAR/DNI    Catherine [name] hoping to get ablation procedure during hospitalization   Catherine [name] would like to spend more time with her great-grandchildren if at all possible  Catherine [name] is understanding of the possible outcomes if she neglects to improve - we have discussed hospice as a consideration if this is the reality   Ongoing palliative care support ______________________________________________________________________________________ Catherine [name] [facility] Palliative Medicine Team Team Cell Phone: [phone] Please utilize secure chat with additional questions, if there is no response within 30 minutes please call the above phone number  Billing based on MDM: Moderate  Palliative Medicine Team providers are available by phone from 7am to 7pm daily and can be reached through the team cell phone.  Should this patient require assistance outside of these hours,  please call the patient's attending physician.

## 2024-04-18 NOTE — Telephone Encounter (Signed)
 Patient has been admitted to [facility] room 6 [address] 05. Pt family wants to know if Dr Harden could come by

## 2024-04-18 NOTE — Evaluation (Signed)
 Physical Therapy Brief Evaluation and Discharge Note Patient Details Name: Catherine [name] MRN: [id number] DOB: [birth year] Today's Date: 04/18/2024   History of Present Illness  The pt is an 89 yo female presenting 6/28 with SOB and LE swelling. Work up revealed CHF exacerbation and possible CAP. PMH includes: afib on coumadin , severe mitral stenosis s/p valve replacement, CAD s/p CABG x1, HTN, HLD, HFpEF, chronic back pain with past surgeries, and CKD III.  Clinical Impression  Pt doing well with mobility and no further PT needed.  Pt feels she can work on building up her activity tolerance at home. Ready for dc from PT standpoint. Will have mobility team follow for continued in hospital activity to prevent deconditioning.         PT Assessment Patient does not need any further PT services  Assistance Needed at Discharge  PRN    Equipment Recommendations None recommended by PT  Recommendations for Other Services       Precautions/Restrictions Precautions Recall of Precautions/Restrictions: Intact Restrictions Weight Bearing Restrictions Per Provider Order: No        Mobility  Bed Mobility       General bed mobility comments: Pt up in chair  Transfers Overall transfer level: Modified independent Equipment used: None Transfers: Sit to/from Stand Sit to Stand: Modified independent (Device/Increase time)           General transfer comment: Able to perform repeated sit to stand x 4 without use of hands on last 3.    Ambulation/Gait Ambulation/Gait assistance: Supervision, Modified independent (Device/Increase time) Gait Distance (Feet): 275 Feet Assistive device: None Gait Pattern/deviations: Step-through pattern, Decreased stride length Gait Speed: Below normal General Gait Details: Steady gait with 1 standing rest break  Home Activity Instructions Home Activity Instructions: Instructed in repeat sit to stand at breakfast, lunch and dinner working her way up  to 10 reps. Instructed in bacwards gait with counter support twice a day.  Stairs            Modified Rankin (Stroke Patients Only)        Balance Overall balance assessment: Needs assistance   Sitting balance-Leahy Scale: Normal     Standing balance support: No upper extremity supported, During functional activity Standing balance-Leahy Scale: Good            Pertinent Vitals/Pain PT - Brief Vital Signs All Vital Signs Stable: Yes Pain Assessment Pain Assessment: No/denies pain     Home Living Family/patient expects to be discharged to:: Private residence Living Arrangements: Spouse/significant other;Children Available Help at Discharge: Family;Available 24 hours/day Home Environment: Level entry   Home Equipment: Agricultural consultant (2 wheels);Cane - single point;Shower seat        Prior Function Level of Independence: Independent Comments: Does not use assistive device    UE/LE Assessment   UE ROM/Strength/Tone/Coordination: WFL    LE ROM/Strength/Tone/Coordination: WFL      Communication   Communication Communication: No apparent difficulties     Cognition Overall Cognitive Status: Appears within functional limits for tasks assessed/performed       General Comments General comments (skin integrity, edema, etc.): SpO2 93-95% on RA with amb    Exercises     Assessment/Plan    PT Problem List Decreased balance;Decreased activity tolerance       PT Visit Diagnosis Other abnormalities of gait and mobility (R26.89)    No Skilled PT Patient at baseline level of functioning   Co-evaluation  AMPAC 6 Clicks Help needed turning from your back to your side while in a flat bed without using bedrails?: None Help needed moving from lying on your back to sitting on the side of a flat bed without using bedrails?: None Help needed moving to and from a bed to a chair (including a wheelchair)?: None Help needed standing up from a  chair using your arms (e.g., wheelchair or bedside chair)?: None Help needed to walk in hospital room?: A Little Help needed climbing 3-5 steps with a railing? : A Little 6 Click Score: 22      End of Session   Activity Tolerance: Patient tolerated treatment well Patient left: in chair;with call bell/phone within reach   PT Visit Diagnosis: Other abnormalities of gait and mobility (R26.89)     Time: 8993-8971 PT Time Calculation (min) (ACUTE ONLY): 22 min  Charges:   PT Evaluation $PT Eval Low Complexity: 1 Low      [facility] PT Acute Rehabilitation Services Office [phone]   Rodgers ORN [facility]  04/18/2024, 11:30 AM

## 2024-04-18 NOTE — Evaluation (Signed)
 Occupational Therapy Evaluation & Discharge  Patient Details Name: Catherine [name] MRN: [id number] DOB: Jun 10, 1935 Today's Date: 04/18/2024   History of Present Illness   The pt is an 89 yo female presenting 6/28 with SOB and LE swelling. Work up revealed CHF exacerbation and possible CAP. PMH includes: afib on coumadin , severe mitral stenosis s/p valve replacement, CAD s/p CABG x1, HTN, HLD, HFpEF, chronic back pain with past surgeries, and CKD III.     Clinical Impressions Pt admitted based on above, and was seen based on problem list below. PTA pt was independent with ADLs and IADLs. Today pt is at her functional baseline of mod I for ADLs with increased time, no AD. Pt able to tolerate in room mobility, standing bathing and grooming task without LOB, and maintaining O2 levels on RA. Provided and reviewed energy conservation handout and DME education with pt. Pt verbalizing understanding of all education. Pt reporting no concerns regarding self-care needs at d/c. No follow up OT or DME needs. No further acute OT needs, OT is signing off.       If plan is discharge home, recommend the following:   Assistance with cooking/housework     Functional Status Assessment   Patient has not had a recent decline in their functional status     Equipment Recommendations   None recommended by OT      Precautions/Restrictions   Precautions Precautions: Fall Recall of Precautions/Restrictions: Intact Restrictions Weight Bearing Restrictions Per Provider Order: No     Mobility Bed Mobility     General bed mobility comments: Received in room standing    Transfers Overall transfer level: Needs assistance Equipment used: None Transfers: Sit to/from Stand, Bed to chair/wheelchair/BSC Sit to Stand: Supervision     Step pivot transfers: Supervision     General transfer comment: S for safety      Balance Overall balance assessment: Mild deficits observed, not formally  tested       ADL either performed or assessed with clinical judgement   ADL Overall ADL's : Modified independent;At baseline     General ADL Comments: Mod I for increased time no AD, reviewed energy conservation strategies and use of DME     Vision Baseline Vision/History: 0 No visual deficits Vision Assessment?: No apparent visual deficits            Pertinent Vitals/Pain Pain Assessment Pain Assessment: No/denies pain     Extremity/Trunk Assessment Upper Extremity Assessment Upper Extremity Assessment: Generalized weakness   Lower Extremity Assessment Lower Extremity Assessment: Defer to PT evaluation   Cervical / Trunk Assessment Cervical / Trunk Assessment: Kyphotic;Other exceptions   Communication Communication Communication: No apparent difficulties   Cognition Arousal: Alert Behavior During Therapy: WFL for tasks assessed/performed Cognition: No apparent impairments   OT - Cognition Comments: Pt reports STM deficits, slightly tangental, but overall WFL     Following commands: Intact       Cueing  General Comments   Cueing Techniques: Verbal cues  VSS on RA, Access Code: [email or web address]  URL: [email or web address]  Date: 04/18/2024  Prepared by: Adrianne Savers    Patient Education  - Understanding Energy Conservation  - Energy Conservation During Daily Tasks           Home Living Family/patient expects to be discharged to:: Private residence Living Arrangements: Spouse/significant other;Children Available Help at Discharge: Family;Available 24 hours/day Type of Home: House Home Access: Level entry     Home Layout: One level     Bathroom  Shower/Tub: Producer, television/film/video: Standard Bathroom Accessibility: Yes How Accessible: Accessible via walker Home Equipment: Agricultural consultant (2 wheels);Cane - single point;Shower seat          Prior Functioning/Environment Prior Level of Function : Independent/Modified  Independent;Driving   Mobility Comments: No DME, no falls      OT Problem List: Cardiopulmonary status limiting activity;Decreased activity tolerance        OT Goals(Current goals can be found in the care plan section)   Acute Rehab OT Goals Patient Stated Goal: To get better OT Goal Formulation: All assessment and education complete, DC therapy Time For Goal Achievement: 05/02/24 Potential to Achieve Goals: Good   AM-PAC OT 6 Clicks Daily Activity     Outcome Measure Help from another person eating meals?: None Help from another person taking care of personal grooming?: None Help from another person toileting, which includes using toliet, bedpan, or urinal?: None Help from another person bathing (including washing, rinsing, drying)?: None Help from another person to put on and taking off regular upper body clothing?: None Help from another person to put on and taking off regular lower body clothing?: None 6 Click Score: 24   End of Session Nurse Communication: Mobility status  Activity Tolerance: Patient tolerated treatment well Patient left: in chair;with call bell/phone within reach  OT Visit Diagnosis: Muscle weakness (generalized) (M62.81)                Time: 9157-9093 OT Time Calculation (min): 24 min Charges:  OT General Charges $OT Visit: 1 Visit OT Evaluation $OT Eval Moderate Complexity: 1 Mod  Kurt Azimi C, OT  Acute Rehabilitation Services Office [phone] Secure chat preferred   Adrianne GORMAN Savers 04/18/2024, 9:15 AM

## 2024-04-18 NOTE — Consult Note (Signed)
 ORTHOPAEDIC CONSULTATION  REQUESTING PHYSICIAN: Fairy Frames, MD  Chief Complaint: Traumatic venous insufficiency ulcer left leg  HPI: Catherine [name] is a 89 y.o. female who presents with chronic venous insufficiency bilateral lower extremities.  Patient does wear compression stockings she states that she struck a coffee table with her left leg and had profuse bleeding through the compression stocking.  Past Medical History:  Diagnosis Date   AKI (acute kidney injury) (HCC) 06/22/2022   Aortic stenosis, mild 07/26/2017   Arthritis    Asthma    last attack 02/2015   Cellulitis of left lower extremity 08/17/2016   Ulcer associated with severe venous insufficiency   Chronic anticoagulation    Chronic diastolic CHF (congestive heart failure) (HCC)    Chronic kidney disease    RIGHT MANY KIDNEY INFECTIONS AND STONES   COPD (chronic obstructive pulmonary disease) (HCC)    Coronary artery disease    Dizziness    H/O: rheumatic fever    Hypertension    PAF (paroxysmal atrial fibrillation) (HCC)    PONV (postoperative nausea and vomiting)    ' SOMETIMES', BUT NOT ALWAYS   Primary hypertension    RHEUMATIC MITRAL STENOSIS 01/05/2008   Qualifier: Diagnosis of   By: Neysa MD, Clinton D        S/P Maze operation for atrial fibrillation 10/14/2016   Complete bilateral atrial lesion set using cryothermy and bipolar radiofrequency ablation - atrial appendage was not treated due to previous surgical procedure (open mitral commissurotomy)   S/P mitral valve replacement with bioprosthetic valve 10/14/2016   29 mm Medtronic Mosaic porcine bioprosthetic tissue valve   Sciatica 08/29/2023   Tricuspid regurgitation 07/14/2021   UTI (urinary tract infection)    Valvular heart disease    Has mitral stenosis with prior mitral commissurotomy in 1970   Past Surgical History:  Procedure Laterality Date   ABDOMINAL HYSTERECTOMY  1983   endometriosis   APPENDECTOMY     BACK SURGERY      neurosurgery x2   CARDIAC CATHETERIZATION     CARDIAC CATHETERIZATION N/A 08/03/2016   Procedure: Right/Left Heart Cath and Coronary Angiography;  Surgeon: Peter M Swaziland, MD;  Location: [facility] INVASIVE CV LAB;  Service: Cardiovascular;  Laterality: N/A;   cataract surg     CHOLECYSTECTOMY N/A 05/30/2015   Procedure: LAPAROSCOPIC CHOLECYSTECTOMY WITH INTRAOPERATIVE CHOLANGIOGRAM;  Surgeon: Morene Olives, MD;  Location: [facility] OR;  Service: General;  Laterality: N/A;   COLONOSCOPY     CORONARY ARTERY BYPASS GRAFT N/A 10/14/2016   Procedure: CORONARY ARTERY BYPASS GRAFTING (CABG);  Surgeon: Sudie VEAR Laine, MD;  Location[facility] OR;  Service: Open Heart Surgery;  Laterality: N/A;   EYE SURGERY     MAZE N/A 10/14/2016   Procedure: MAZE;  Surgeon: Sudie VEAR Laine, MD;  Location: [facility] OR;  Service: Open Heart Surgery;  Laterality: N/A;   MITRAL VALVE REPLACEMENT N/A 10/14/2016   Procedure: REDO MITRAL VALVE REPLACEMENT (MVR);  Surgeon: Sudie VEAR Laine, MD;  Location: [facility] OR;  Service: Open Heart Surgery;  Laterality: N/A;   MITRAL VALVE SURGERY Left 1970   Open mitral commissurotomy via left thoracotomy approach   PACEMAKER IMPLANT N/A 09/22/2023   Procedure: PACEMAKER IMPLANT;  Surgeon: Kennyth Chew, MD;  Location: [facility] INVASIVE CV LAB;  Service: Cardiovascular;  Laterality: N/A;   TEE WITHOUT CARDIOVERSION N/A 07/05/2016   Procedure: TRANSESOPHAGEAL ECHOCARDIOGRAM (TEE);  Surgeon: Annabella Scarce, MD;  Location: [facility] ENDOSCOPY;  Service: Cardiovascular;  Laterality: N/A;   TEE WITHOUT  CARDIOVERSION N/A 10/14/2016   Procedure: TRANSESOPHAGEAL ECHOCARDIOGRAM (TEE);  Surgeon: Sudie VEAR Laine, MD;  Location: [facility];  Service: Open Heart Surgery;  Laterality: N/A;   Social History   Socioeconomic History   Marital status: Married    Spouse name: Not on file   Number of children: Not on file   Years of education: Not on file   Highest education level: Not on file  Occupational History   Not on file   Tobacco Use   Smoking status: Former    Current packs/day: 0.00    Average packs/day: 1 pack/day for 15.0 years (15.0 ttl pk-yrs)    Types: Cigarettes    Start date: 09/27/1960    Quit date: 09/28/1975    Years since quitting: 48.5   Smokeless tobacco: Never  Vaping Use   Vaping status: Never Used  Substance and Sexual Activity   Alcohol use: No    Alcohol/week: 0.0 standard drinks of alcohol   Drug use: No   Sexual activity: Yes  Other Topics Concern   Not on file  Social History Narrative   Not on file   Social Drivers of Health   Financial Resource Strain: Low Risk  (04/09/2022)   Overall Financial Resource Strain (CARDIA)    Difficulty of Paying Living Expenses: Not hard at all  Food Insecurity: No Food Insecurity (04/16/2024)   Hunger Vital Sign    Worried About Running Out of Food in the Last Year: Never true    Ran Out of Food in the Last Year: Never true  Transportation Needs: No Transportation Needs (04/16/2024)   PRAPARE - Administrator, Civil Service (Medical): No    Lack of Transportation (Non-Medical): No  Physical Activity: Insufficiently Active (04/09/2022)   Exercise Vital Sign    Days of Exercise per Week: 5 days    Minutes of Exercise per Session: 20 min  Stress: No Stress Concern Present (04/09/2022)   [employer] of Occupational Health - Occupational Stress Questionnaire    Feeling of Stress : Not at all  Social Connections: Socially Integrated (04/16/2024)   Social Connection and Isolation Panel    Frequency of Communication with Friends and Family: More than three times a week    Frequency of Social Gatherings with Friends and Family: More than three times a week    Attends Religious Services: More than 4 times per year    Active Member of [employer] or Organizations: Yes    Attends Engineer, structural: More than 4 times per year    Marital Status: Married   Family History  Problem Relation Age of Onset   Leukemia Father     Breast cancer Neg Hx    - negative except otherwise stated in the family history section Allergies  Allergen Reactions   Aldactone  [Spironolactone ] Other (See Comments)    Dyspnea    Amoxil  [Amoxicillin ] Palpitations and Other (See Comments)    Tachycardia   Zebeta  [Bisoprolol ] Shortness Of Breath   Calan  [Verapamil ] Other (See Comments)    Myalgias    Crestor  [Rosuvastatin ] Other (See Comments)    Myalgias    Flovent  [Fluticasone  Propionate] Other (See Comments)    Leg cramps   Livalo  [Pitavastatin ] Other (See Comments)    Myalgias    Lyrica [Pregabalin] Swelling   Neurontin [Gabapentin] Swelling   Norvasc  [Amlodipine ] Swelling    Low extremity edema   Zetia  [Ezetimibe ] Other (See Comments)    Myalgias   Zocor  [Simvastatin ] Other (See  Comments)    Myalgias    Lotensin [Benazepril] Cough   Polytrim  [Polymyxin B -Trimethoprim ] Swelling   Prior to Admission medications   Medication Sig Start Date End Date Taking? Authorizing Provider  acetaminophen  (TYLENOL ) 500 MG tablet Take 500 mg by mouth every 6 (six) hours as needed for mild pain (pain score 1-3).   Yes [provider]  albuterol  (PROVENTIL ) (2.5 MG/3ML) 0.083% nebulizer solution Take 3 mLs (2.5 mg total) by nebulization every 6 (six) hours as needed (during COPD Exacerbation). 02/15/24 02/14/25 Yes Hope Almarie ORN, NP  albuterol  (VENTOLIN  HFA) 108 (90 Base) MCG/ACT inhaler INHALE 2 PUFFS INTO THE LUNGS EVERY 4 HOURS AS NEEDED FOR AHEEZING OR SHORTNESS OF BREATH 02/21/24  Yes Parrett, Tammy S, NP  apixaban  (ELIQUIS ) 2.5 MG TABS tablet TAKE ONE TABLET TWICE DAILY (START 09/18/23 OR AS DIRECTED) 10/24/23  Yes Burchette, Wolm ORN, MD  bumetanide  (BUMEX ) 2 MG tablet Take 1 tablet (2 mg total) by mouth daily. Patient taking differently: Take 4 mg by mouth daily. 04/09/24 05/09/24 Yes Swinyer, Rosaline HERO, NP  cholecalciferol (VITAMIN D3) 25 MCG (1000 UNIT) tablet Take 1,000 Units by mouth daily.   Yes [provider]   diazepam  (VALIUM ) 5 MG tablet TAKE ONE TABLET BY MOUTH AT BEDTIME 12/12/23  Yes Burchette, Wolm ORN, MD  estradiol  (ESTRACE ) 0.1 MG/GM vaginal cream Place 1 Applicatorful vaginally See admin instructions. Apply a small amount vaginally three nights a week on Monday, Wednesday and Friday.   Yes [provider]  fluticasone  (FLONASE ) 50 MCG/ACT nasal spray Place 1 spray into both nostrils daily as needed for allergies or rhinitis. 08/30/22  Yes Sood, Vineet, MD  HYDROcodone -acetaminophen  (NORCO/VICODIN) 5-325 MG tablet TAKE ONE TABLET EVERY 8 HOURS AS NEEDED FOR PAIN 04/01/24  Yes Burchette, Wolm ORN, MD  metolazone  (ZAROXOLYN ) 5 MG tablet TAKE ONE TABLET WEEKLY 30 MINUTES BEFORE BUMEX  DOSE. 04/09/24  Yes Swinyer, Rosaline HERO, NP  mupirocin  ointment (BACTROBAN ) 2 % APPLY TOPICALLY ONCE A DAY 03/16/24  Yes Burchette, Wolm ORN, MD  sacubitril -valsartan  (ENTRESTO ) 24-26 MG Take 1/2 tablet twice a day 04/06/24  Yes Walker, Caitlin S, NP  potassium chloride  (MICRO-K ) 10 MEQ CR capsule Take 80 mEq by mouth daily. Patient not taking: Reported on 04/16/2024    [provider]   No results found. - pertinent xrays, CT, MRI studies were reviewed and independently interpreted  Positive ROS: All other systems have been reviewed and were otherwise negative with the exception of those mentioned in the HPI and as above.  Physical Exam: General: Alert, no acute distress Psychiatric: Patient is competent for consent with normal mood and affect Lymphatic: No axillary or cervical lymphadenopathy Cardiovascular: No pedal edema Respiratory: No cyanosis, no use of accessory musculature GI: No organomegaly, abdomen is soft and non-tender    Images:  @ENCIMAGES @  Labs:  Lab Results  Component Value Date   HGBA1C 5.3 10/12/2016   ESRSEDRATE 55 (H) 04/16/2024   ESRSEDRATE 3 07/09/2020   CRP 0.5 04/16/2024   CRP 1.0 (H) 09/05/2019   CRP 4.2 (H) 09/04/2019   LABURIC 5.4 02/27/2013   REPTSTATUS  PENDING 04/16/2024   REPTSTATUS PENDING 04/16/2024   CULT  04/16/2024    NO GROWTH 2 DAYS Performed at [facility], [address], [place], [place] [address]    CULT  04/16/2024    NO GROWTH 2 DAYS Performed at [facility], [address], [place], [place] [address]    LABORGA ESCHERICHIA COLI (A)  11/01/2021    Lab Results  Component Value Date   ALBUMIN  2.8 (L) 04/15/2024   ALBUMIN  2.3 (L) 03/25/2024   ALBUMIN  2.8 (L) 09/13/2023   LABURIC 5.4 02/27/2013        Latest Ref Rng & Units 04/18/2024    4:53 AM 04/17/2024    5:31 AM 04/15/2024    4:04 PM  CBC EXTENDED  WBC 4.0 - 10.5 K/uL 5.7  6.1  9.5   RBC 3.87 - 5.11 MIL/uL 3.27  3.42  4.35   Hemoglobin 12.0 - 15.0 g/dL 89.9  89.5  86.7   HCT 36.0 - 46.0 % 29.9  30.5  38.5   Platelets 150 - 400 K/uL 113  116  146     Neurologic: Patient does not have protective sensation bilateral lower extremities.   MUSCULOSKELETAL:   Skin: Examination patient has brawny skin color changes with pitting edema both lower extremities there is a traumatic venous ulcer left leg approximately a centimeter in diameter.  There is no cellulitis no drainage no signs of infection.  Hemoglobin 10.0 white blood cell count 5.7.  Albumin  2.8.  Sed rate 55 with a C-reactive protein of 0.5.  Assessment: Assessment: Traumatic venous ulcer left leg.  Plan: Plan: I will follow-up in the office 1 week after discharge.  She may continue to use the Bactroban  ointment dressing changes daily.  Thank you for the consult and the opportunity to see Ms. Tish Jerona Sage, MD [facility] [phone] 3:08 PM

## 2024-04-19 ENCOUNTER — Ambulatory Visit: Admitting: Orthopedic Surgery

## 2024-04-19 DIAGNOSIS — R7989 Other specified abnormal findings of blood chemistry: Secondary | ICD-10-CM

## 2024-04-19 DIAGNOSIS — R609 Edema, unspecified: Secondary | ICD-10-CM | POA: Diagnosis not present

## 2024-04-19 DIAGNOSIS — I87332 Chronic venous hypertension (idiopathic) with ulcer and inflammation of left lower extremity: Secondary | ICD-10-CM | POA: Diagnosis not present

## 2024-04-19 DIAGNOSIS — I5033 Acute on chronic diastolic (congestive) heart failure: Secondary | ICD-10-CM | POA: Diagnosis not present

## 2024-04-19 LAB — CBC
HCT: 30.6 % — ABNORMAL LOW (ref 36.0–46.0)
Hemoglobin: 10.4 g/dL — ABNORMAL LOW (ref 12.0–15.0)
MCH: 31 pg (ref 26.0–34.0)
MCHC: 34 g/dL (ref 30.0–36.0)
MCV: 91.3 fL (ref 80.0–100.0)
Platelets: 125 K/uL — ABNORMAL LOW (ref 150–400)
RBC: 3.35 MIL/uL — ABNORMAL LOW (ref 3.87–5.11)
RDW: 13.5 % (ref 11.5–15.5)
WBC: 6.4 K/uL (ref 4.0–10.5)
nRBC: 0 % (ref 0.0–0.2)

## 2024-04-19 LAB — BASIC METABOLIC PANEL WITH GFR
Anion gap: 6 (ref 5–15)
BUN: 32 mg/dL — ABNORMAL HIGH (ref 8–23)
CO2: 28 mmol/L (ref 22–32)
Calcium: 9.1 mg/dL (ref 8.9–10.3)
Chloride: 102 mmol/L (ref 98–111)
Creatinine, Ser: 1.1 mg/dL — ABNORMAL HIGH (ref 0.44–1.00)
GFR, Estimated: 49 mL/min — ABNORMAL LOW (ref >60)
Glucose, Bld: 97 mg/dL (ref 70–99)
Potassium: 4.4 mmol/L (ref 3.5–5.1)
Sodium: 136 mmol/L (ref 135–145)

## 2024-04-19 LAB — ZINC: Zinc: 39 ug/dL — ABNORMAL LOW (ref 44–115)

## 2024-04-19 MED ORDER — FUROSEMIDE 10 MG/ML IJ SOLN
40.0000 mg | Freq: Once | INTRAMUSCULAR | Status: AC
  Administered 2024-04-19: 40 mg via INTRAVENOUS
  Filled 2024-04-19: qty 4

## 2024-04-19 MED ORDER — HYDROCODONE-ACETAMINOPHEN 5-325 MG PO TABS
1.0000 | ORAL_TABLET | Freq: Four times a day (QID) | ORAL | Status: DC | PRN
  Administered 2024-04-19 – 2024-04-24 (×8): 1 via ORAL
  Filled 2024-04-19 (×8): qty 1

## 2024-04-19 NOTE — Plan of Care (Signed)

## 2024-04-19 NOTE — Progress Notes (Signed)
  Progress Note  Patient Name: Catherine [name] Date of Encounter: 04/19/2024 [facility] Cardiologist: Annabella Scarce, MD   Interval Summary   Feels poorly today. LE edema, likely mostly dependent. Felt better yesterday with lasix  but did not have a particularly robust UOP.  Vital Signs Vitals:   04/18/24 1952 04/19/24 0031 04/19/24 0417 04/19/24 0806  BP: (!) 102/49 (!) 86/47 (!) 97/50 98/68  Pulse: 64 (!) 59 60 69  Resp: 20 18 16 16   Temp: 98.8 F (37.1 C) (!) 97.5 F (36.4 C) 98.2 F (36.8 C) 98.6 F (37 C)  TempSrc: Oral Oral Oral Oral  SpO2: 93% 96% 96% 90%  Weight:      Height:        Intake/Output Summary (Last 24 hours) at 04/19/2024 1110 Last data filed at 04/18/2024 2200 Gross per 24 hour  Intake --  Output 500 ml  Net -500 ml      04/15/2024    3:29 PM 04/13/2024    3:59 PM 04/12/2024   10:13 AM  Last 3 Weights  Weight (lbs) 115 lb 117 lb 120 lb 9.6 oz  Weight (kg) 52.164 kg 53.071 kg 54.704 kg      Telemetry/ECG  Av pacing - Personally Reviewed  Physical Exam  GEN: No acute distress.   Neck: No JVD Cardiac: RRR, soft systolic murmur Respiratory: Clear to auscultation bilaterally. GI: Soft, nontender, non-distended  MS: No edema  Assessment & Plan  HFpEF Chronic LE edema, likely third spacing from poor nutrition/hypoalbuminemia - diuresed yesterday, modest output but she felt better, continue IV lasix  40 mg once today and monitor response.  - holding entresto  - takes bumex  and metolazone  at home, likely needs some chronic diuresis but did present slightly overdiuresed and required IVF with AKI.   PAF SN dysfunction with MDT PPM 12/24 Junctional tachycardia -intolerant to metop and bisoprolol , but EP recommends retrialing, will consider on day prior to dc - on eliquis  2.5 mg BID, age and weight adjusted - ablation of SVT/Junc tach scheduled in August now, moved up by EP per patient preference.  I remain concerned about her  nutritional status at home and overall frailty and decline. Recommend continued RD and Palliative involvement.    For questions or updates, please contact [facility] Please consult [email or web address] for contact info under       Signed, Harper Smoker A Temitope Griffing, MD

## 2024-04-19 NOTE — Progress Notes (Addendum)
 [facility]  PROGRESS NOTE  Catherine [name] FMW:[id number] DOB: 07/02/1935 DOA: 04/15/2024 PCP: Micheal Wolm ORN, MD   Brief HPI:   88/F with history of SVT, junctional tachycardia, due for ablation in September, paroxysmal A-fib on Coumadin , bioprosthetic MVR, CABG, diastolic CHF, COPD, chronic back pain, debility presented to the ED with cough, worsening shortness of breath and edema. -Recently seen in cards clinic, Bumex  dose increased for 3 days without improvement  -In the ER blood pressure borderline, troponins 135, proBNP 1400, creatinine 1.3, Chest x-ray showing atelectasis, small right pleural effusion. - Cards, EP following    Assessment/Plan:   Acute on chronic HFpEF Hypoalbuminemia and third spacing also contributing Noted elevated proBNP 14,000 -Appears volume overloaded restart IV Lasix  today, GDMT limited by hypotension -Added midodrine    Hypotension AKI on CKD stage IIIa -Creatinine back to baseline, patient reports chronic hypotension -Added low-dose midodrine    Junctional tachycardia/SVT History of paroxysmal A-fib Sinus node dysfunction s/p PPM 08/2023 EP consulted, scheduled for ablation in September  - Now preponed to later this week -Eliquis     ?  UTI Currently afebrile, with no leukocytosis -She denies any symptoms, antibiotics discontinued   H/o vaginal candidiasis Fluconazole    CAD s/p prior CABG Denies any chest pain Troponin elevated, possibly demand ischemia History of statin intolerance well-documented Continue Eliquis    Hypokalemia 2/2 overdiuresis Replete as needed   COPD Duonebs prn   ??Adult failure to thrive Low albumin , frequent admission Dietitian consulted Palliative consulted for GOC discussions, now DNR PT/OT   Venous ulcer left leg - Seen by orthopedics Dr. Harden - He will see her in office  1 week after discharge - Continue Bactroban  ointment dressing changes daily    Medications     apixaban   2.5 mg  Oral BID   feeding supplement  237 mL Oral BID BM   lactose free nutrition  237 mL Oral BID BM   midodrine   5 mg Oral BID WC   multivitamin with minerals  1 tablet Oral Daily   mupirocin  cream   Topical Daily     Data Reviewed:   CBG:  No results for input(s): GLUCAP in the last 168 hours.  SpO2: 90 %    Vitals:   04/18/24 1952 04/19/24 0031 04/19/24 0417 04/19/24 0806  BP: (Abnormal) 102/49 (Abnormal) 86/47 (Abnormal) 97/50 98/68  Pulse: 64 (Abnormal) 59 60 69  Resp: 20 18 16 16   Temp: 98.8 F (37.1 C) (Abnormal) 97.5 F (36.4 C) 98.2 F (36.8 C) 98.6 F (37 C)  TempSrc: Oral Oral Oral Oral  SpO2: 93% 96% 96% 90%  Weight:      Height:          Data Reviewed:  Basic Metabolic Panel: Recent Labs  Lab 04/12/24 0837 04/15/24 1604 04/17/24 0531 04/18/24 0453 04/19/24 0411  NA 138 137 136 135 136  K 5.6* 2.8* 4.4 4.5 4.4  CL 102 97* 103 102 102  CO2 23 26 25 24 28   GLUCOSE 80 134* 111* 89 97  BUN 24 29* 24* 25* 32*  CREATININE 1.00 1.32* 1.01* 1.05* 1.10*  CALCIUM  9.2 9.2 8.8* 8.9 9.1  MG  --  1.8 2.0  --   --     CBC: Recent Labs  Lab 04/15/24 1604 04/17/24 0531 04/18/24 0453 04/19/24 0411  WBC 9.5 6.1 5.7 6.4  HGB 13.2 10.4* 10.0* 10.4*  HCT 38.5 30.5* 29.9* 30.6*  MCV 88.5 89.2 91.4 91.3  PLT 146* 116* 113* 125*  LFT Recent Labs  Lab 04/15/24 1604  AST 36  ALT 10  ALKPHOS 94  BILITOT 0.2  PROT 5.5*  ALBUMIN  2.8*     Antibiotics: Anti-infectives (From admission, onward)    Start     Dose/Rate Route Frequency Ordered Stop   04/18/24 0830  fluconazole  (DIFLUCAN ) tablet 100 mg        100 mg Oral  Once 04/18/24 0737 04/18/24 1305   04/17/24 1330  fluconazole  (DIFLUCAN ) tablet 150 mg        150 mg Oral  Once 04/17/24 1236 04/17/24 1454   04/16/24 1100  cefTRIAXone  (ROCEPHIN ) 1 g in sodium chloride  0.9 % 100 mL IVPB  Status:  Discontinued        1 g 200 mL/hr over 30 Minutes Intravenous Every 24 hours 04/16/24 0928 04/18/24 0732    04/15/24 2000  ceFAZolin  (ANCEF ) IVPB 1 g/50 mL premix        1 g 100 mL/hr over 30 Minutes Intravenous  Once 04/15/24 1956 04/15/24 2052        DVT prophylaxis: Apixaban   Code Status: DNR  Family Communication: No family at bedside   CONSULTS cardiology   Subjective   Denies shortness of breath   Objective    Physical Examination:   General-appears in no acute distress Heart-S1-S2, regular, no murmur auscultated Lungs-clear to auscultation bilaterally, no wheezing or crackles auscultated Abdomen-soft, nontender, no organomegaly Extremities-trace edema in the lower extremities Neuro-alert, oriented x3, no focal deficit noted  Status is: Inpatient:             Sabas GORMAN Brod   [facility] If 7PM-7AM, please contact night-coverage at [email or web address], Office  [phone]   04/19/2024, 8:14 AM  LOS: 3 days

## 2024-04-19 NOTE — Plan of Care (Signed)
  Problem: Health Behavior/Discharge Planning: Goal: Ability to manage health-related needs will improve Outcome: Progressing   Problem: Clinical Measurements: Goal: Respiratory complications will improve Outcome: Progressing Goal: Cardiovascular complication will be avoided Outcome: Progressing   Problem: Activity: Goal: Risk for activity intolerance will decrease Outcome: Progressing   Problem: Pain Managment: Goal: General experience of comfort will improve and/or be controlled Outcome: Progressing   Problem: Safety: Goal: Ability to remain free from injury will improve Outcome: Progressing

## 2024-04-19 NOTE — Progress Notes (Signed)
 Mobility Specialist Progress Note;   04/19/24 1019  Mobility  Activity Transferred from bed to chair  Level of Assistance Contact guard assist, steadying assist  Assistive Device None  Distance Ambulated (ft) 3 ft  Activity Response Tolerated well  Mobility Referral Yes  Mobility visit 1 Mobility  Mobility Specialist Start Time (ACUTE ONLY) 1019  Mobility Specialist Stop Time (ACUTE ONLY) 1031  Mobility Specialist Time Calculation (min) (ACUTE ONLY) 12 min   Pt deferred ambulation in hallway, however agreeable to sit up in the chair for an hour. C/o back pain and SOB this AM. Required MinG assistance to safely transfer to chair. Provided hot pack for back and hot blanket. Pt left in chair with all needs met, call bell in reach.   Lauraine Erm Mobility Specialist Please contact via SecureChat or [employer] [phone]

## 2024-04-20 ENCOUNTER — Telehealth

## 2024-04-20 DIAGNOSIS — R5381 Other malaise: Secondary | ICD-10-CM | POA: Diagnosis not present

## 2024-04-20 DIAGNOSIS — E876 Hypokalemia: Secondary | ICD-10-CM | POA: Diagnosis not present

## 2024-04-20 DIAGNOSIS — I480 Paroxysmal atrial fibrillation: Secondary | ICD-10-CM

## 2024-04-20 DIAGNOSIS — I5033 Acute on chronic diastolic (congestive) heart failure: Secondary | ICD-10-CM | POA: Diagnosis not present

## 2024-04-20 DIAGNOSIS — E8809 Other disorders of plasma-protein metabolism, not elsewhere classified: Secondary | ICD-10-CM

## 2024-04-20 DIAGNOSIS — I87332 Chronic venous hypertension (idiopathic) with ulcer and inflammation of left lower extremity: Secondary | ICD-10-CM | POA: Diagnosis not present

## 2024-04-20 MED ORDER — ALBUMIN HUMAN 25 % IV SOLN
12.5000 g | Freq: Once | INTRAVENOUS | Status: AC
  Administered 2024-04-20: 12.5 g via INTRAVENOUS
  Filled 2024-04-20: qty 50

## 2024-04-20 MED ORDER — SENNA 8.6 MG PO TABS
1.0000 | ORAL_TABLET | Freq: Every day | ORAL | Status: DC | PRN
  Administered 2024-04-23 (×2): 8.6 mg via ORAL
  Filled 2024-04-20 (×2): qty 1

## 2024-04-20 MED ORDER — FUROSEMIDE 10 MG/ML IJ SOLN
40.0000 mg | Freq: Once | INTRAMUSCULAR | Status: AC
  Administered 2024-04-20: 40 mg via INTRAVENOUS
  Filled 2024-04-20: qty 4

## 2024-04-20 MED ORDER — ZINC SULFATE 220 (50 ZN) MG PO CAPS
220.0000 mg | ORAL_CAPSULE | Freq: Every day | ORAL | Status: DC
  Administered 2024-04-20 – 2024-04-25 (×6): 220 mg via ORAL
  Filled 2024-04-20 (×6): qty 1

## 2024-04-20 NOTE — Progress Notes (Signed)
 Heart Failure Navigator Progress Note  Assessed for Heart & Vascular TOC clinic readiness.  Patient does not meet criteria due to EF 65-70%, is scheduled for a ablation on 05/30/2024 and has a scheduled [facility] appointment on  06/04/2024. No HF TOC. .   Navigator will sign off at this time.   Stephane Haddock, BSN, Scientist, clinical (histocompatibility and immunogenetics) Only

## 2024-04-20 NOTE — Plan of Care (Signed)
  Problem: Clinical Measurements: Goal: Respiratory complications will improve Outcome: Progressing Goal: Cardiovascular complication will be avoided Outcome: Progressing   Problem: Activity: Goal: Risk for activity intolerance will decrease Outcome: Progressing   Problem: Pain Managment: Goal: General experience of comfort will improve and/or be controlled Outcome: Progressing   Problem: Safety: Goal: Ability to remain free from injury will improve Outcome: Progressing

## 2024-04-20 NOTE — Plan of Care (Signed)
  Problem: Clinical Measurements: Goal: Will remain free from infection Outcome: Progressing Goal: Diagnostic test results will improve Outcome: Progressing Goal: Respiratory complications will improve Outcome: Progressing Goal: Cardiovascular complication will be avoided Outcome: Progressing   Problem: Activity: Goal: Risk for activity intolerance will decrease Outcome: Progressing   Problem: Coping: Goal: Level of anxiety will decrease Outcome: Progressing   Problem: Pain Managment: Goal: General experience of comfort will improve and/or be controlled Outcome: Progressing   Problem: Safety: Goal: Ability to remain free from injury will improve Outcome: Progressing   Problem: Skin Integrity: Goal: Risk for impaired skin integrity will decrease Outcome: Progressing

## 2024-04-20 NOTE — Progress Notes (Signed)
 Progress Note   Patient: Catherine [name] FMW:[id number] DOB: 10-Jul-1935 DOA: 04/15/2024     4 DOS: the patient was seen and examined on 04/20/2024   Brief hospital course: 88/F with history of SVT, junctional tachycardia, due for ablation in September, paroxysmal A-fib on Coumadin , bioprosthetic MVR, CABG, diastolic CHF, COPD, chronic back pain, debility presented to the ED with cough, worsening shortness of breath and edema. Her proBNP elevated, admitted to TRH service for CHF exacerbation.   Assessment and Plan: Acute on chronic HFpEF Hypoalbuminemia and third spacing also contributing Noted elevated proBNP 14,000. Bilateral leg swelling due to low albumin , poor oral intake, on top of heart failure. Will order IV Lasix +albumin .  Did talk to her regarding palliative care. She wishes to be DNR but would like to continue medical management. She is hopeful that ablation will improve her heart condition. GDMT limited by hypotension. Continue midodrine . Cardiology on board appreciate recommendations.   Hypotension AKI on CKD stage IIIa Creatinine stable at baseline, avoid nephrotoxic drugs. Continue midodrine .   Junctional tachycardia/SVT History of paroxysmal A-fib Sinus node dysfunction s/p PPM 08/2023 EP consulted, who rescheduled ablation to August from September. Consider retrial of beta blocker prior to dc. Continue Eliquis     H/o vaginal candidiasis Fluconazole . UTI ruled out.   CAD s/p prior CABG Denies any chest pain Troponin elevated, possibly demand ischemia History of statin intolerance well-documented Continue Eliquis    Hypokalemia 2/2 overdiuresis K improved with repletion.   COPD Duonebs prn.  Chronic Venous insufficiency Ulcer left leg s/p trauma - seen by orthopedics, continue wound care, outpatient ortho follow up in 1 week post dc.   Adult failure to thrive Low albumin , frequent admission Dietitian on board. Encourage oral diet,  supplements. Palliative consulted for GOC discussions, now DNR wishes to continue medical management. PT/ OT for dc plans.        Out of bed to chair. Incentive spirometry. Nursing supportive care. Fall, aspiration precautions. Diet:  Diet Orders (From admission, onward)     Start     Ordered   04/16/24 0939  Diet 2 gram sodium Fluid consistency: Thin; Fluid restriction: 1500 mL Fluid  Diet effective now       Question Answer Comment  Fluid consistency: Thin   Fluid restriction: 1500 mL Fluid      04/16/24 0938           DVT prophylaxis: apixaban  (ELIQUIS ) tablet 2.5 mg Start: 04/16/24 1030 apixaban  (ELIQUIS ) tablet 2.5 mg  Level of care: Progressive   Code Status: Limited: Do not attempt resuscitation (DNR) -DNR-LIMITED -Do Not Intubate/DNI   Subjective: Patient is seen and examined today morning. Feels weak, did work with PT. Eating poor. Seem anxious about her health.  Physical Exam: Vitals:   04/20/24 0007 04/20/24 0359 04/20/24 0805 04/20/24 1147  BP: (!) 96/53 (!) 99/49 (!) 110/56 (!) 100/51  Pulse: (!) 59 61 61 61  Resp: 16 18 18 18   Temp: 98.8 F (37.1 C) 98.6 F (37 C) 98.5 F (36.9 C) 98.6 F (37 C)  TempSrc: Oral Oral Oral Oral  SpO2: 92% 90% 90% 95%  Weight:      Height:        General - Elderly Caucasian female, anxious, no distress HEENT - PERRLA, EOMI, atraumatic head, non tender sinuses. Lung - Clear, basal rales, rhonchi, no wheezes. Heart - S1, S2 heard, no murmurs, rubs, 2+ pedal edema. Abdomen - Soft, non tender, bowel sounds good Neuro - Alert, awake and oriented x  3, non focal exam. Skin - Warm and dry. Lower extremity skin changes, dressing noted.  Data Reviewed:      Latest Ref Rng & Units 04/19/2024    4:11 AM 04/18/2024    4:53 AM 04/17/2024    5:31 AM  CBC  WBC 4.0 - 10.5 K/uL 6.4  5.7  6.1   Hemoglobin 12.0 - 15.0 g/dL 89.5  89.9  89.5   Hematocrit 36.0 - 46.0 % 30.6  29.9  30.5   Platelets 150 - 400 K/uL 125  113  116        Latest Ref Rng & Units 04/19/2024    4:11 AM 04/18/2024    4:53 AM 04/17/2024    5:31 AM  BMP  Glucose 70 - 99 mg/dL 97  89  888   BUN 8 - 23 mg/dL 32  25  24   Creatinine 0.44 - 1.00 mg/dL 8.89  8.94  8.98   Sodium 135 - 145 mmol/L 136  135  136   Potassium 3.5 - 5.1 mmol/L 4.4  4.5  4.4   Chloride 98 - 111 mmol/L 102  102  103   CO2 22 - 32 mmol/L 28  24  25    Calcium  8.9 - 10.3 mg/dL 9.1  8.9  8.8    No results found.  Family Communication: Discussed with patient, she understand and agree. All questions answered.  Disposition: Status is: Inpatient Remains inpatient appropriate because: IV lasix , albumin , weakness  Planned Discharge Destination: Home with Home Health     Time spent: 45 minutes  Author: Concepcion Riser, MD 04/20/2024 2:49 PM Secure chat 7am to 7pm For on call review [email or web address].

## 2024-04-20 NOTE — Progress Notes (Signed)
 Mobility Specialist Progress Note;   04/20/24 1355  Mobility  Activity Ambulated independently in hallway  Level of Assistance Standby assist, set-up cues, supervision of patient - no hands on  Assistive Device None  Distance Ambulated (ft) 125 ft  Activity Response Tolerated well  Mobility Referral Yes  Mobility visit 1 Mobility  Mobility Specialist Start Time (ACUTE ONLY) 1355  Mobility Specialist Stop Time (ACUTE ONLY) 1410  Mobility Specialist Time Calculation (min) (ACUTE ONLY) 15 min   Pt agreeable to mobility w/ encouragement. Required no physical assistance during ambulation, SV. Limited d/t feeling SOB, however VSS on RA. Pt returned back to bed and left with all needs met, call bell in reach. Alarm on.   Lauraine Erm Mobility Specialist Please contact via SecureChat or [employer] [phone]

## 2024-04-21 DIAGNOSIS — I482 Chronic atrial fibrillation, unspecified: Secondary | ICD-10-CM | POA: Diagnosis not present

## 2024-04-21 DIAGNOSIS — I5033 Acute on chronic diastolic (congestive) heart failure: Secondary | ICD-10-CM | POA: Diagnosis not present

## 2024-04-21 DIAGNOSIS — E43 Unspecified severe protein-calorie malnutrition: Secondary | ICD-10-CM

## 2024-04-21 DIAGNOSIS — I10 Essential (primary) hypertension: Secondary | ICD-10-CM | POA: Diagnosis not present

## 2024-04-21 DIAGNOSIS — J449 Chronic obstructive pulmonary disease, unspecified: Secondary | ICD-10-CM

## 2024-04-21 DIAGNOSIS — N1831 Chronic kidney disease, stage 3a: Secondary | ICD-10-CM | POA: Diagnosis not present

## 2024-04-21 DIAGNOSIS — F419 Anxiety disorder, unspecified: Secondary | ICD-10-CM

## 2024-04-21 LAB — BASIC METABOLIC PANEL WITH GFR
Anion gap: 6 (ref 5–15)
BUN: 39 mg/dL — ABNORMAL HIGH (ref 8–23)
CO2: 27 mmol/L (ref 22–32)
Calcium: 9.2 mg/dL (ref 8.9–10.3)
Chloride: 101 mmol/L (ref 98–111)
Creatinine, Ser: 1.08 mg/dL — ABNORMAL HIGH (ref 0.44–1.00)
GFR, Estimated: 50 mL/min — ABNORMAL LOW (ref >60)
Glucose, Bld: 103 mg/dL — ABNORMAL HIGH (ref 70–99)
Potassium: 3.9 mmol/L (ref 3.5–5.1)
Sodium: 134 mmol/L — ABNORMAL LOW (ref 135–145)

## 2024-04-21 LAB — PROTEIN / CREATININE RATIO, URINE
Creatinine, Urine: 80 mg/dL
Protein Creatinine Ratio: 11.41 mg/mg{creat} — ABNORMAL HIGH (ref 0.00–0.15)
Total Protein, Urine: 913 mg/dL

## 2024-04-21 MED ORDER — DIAZEPAM 5 MG PO TABS
5.0000 mg | ORAL_TABLET | Freq: Every day | ORAL | Status: DC
  Administered 2024-04-21 – 2024-04-24 (×4): 5 mg via ORAL
  Filled 2024-04-21 (×5): qty 1

## 2024-04-21 MED ORDER — FUROSEMIDE 10 MG/ML IJ SOLN
60.0000 mg | Freq: Two times a day (BID) | INTRAMUSCULAR | Status: DC
  Administered 2024-04-21: 60 mg via INTRAVENOUS
  Filled 2024-04-21 (×2): qty 6

## 2024-04-21 MED ORDER — EMPAGLIFLOZIN 10 MG PO TABS
10.0000 mg | ORAL_TABLET | Freq: Every day | ORAL | Status: DC
  Administered 2024-04-21 – 2024-04-25 (×5): 10 mg via ORAL
  Filled 2024-04-21 (×5): qty 1

## 2024-04-21 MED ORDER — FUROSEMIDE 10 MG/ML IJ SOLN
40.0000 mg | Freq: Once | INTRAMUSCULAR | Status: AC
  Administered 2024-04-21: 40 mg via INTRAVENOUS
  Filled 2024-04-21: qty 4

## 2024-04-21 MED ORDER — POLYETHYLENE GLYCOL 3350 17 G PO PACK
17.0000 g | PACK | Freq: Every day | ORAL | Status: DC
  Administered 2024-04-21 – 2024-04-22 (×2): 17 g via ORAL
  Filled 2024-04-21 (×3): qty 1

## 2024-04-21 MED ORDER — ALBUTEROL SULFATE (2.5 MG/3ML) 0.083% IN NEBU
2.5000 mg | INHALATION_SOLUTION | Freq: Four times a day (QID) | RESPIRATORY_TRACT | Status: DC
  Administered 2024-04-21 – 2024-04-23 (×8): 2.5 mg via RESPIRATORY_TRACT
  Filled 2024-04-21 (×8): qty 3

## 2024-04-21 NOTE — Assessment & Plan Note (Addendum)
 Echocardiogram with preserved LV systolic function with EF 65 to 70%, moderate LVH, RV with mild reduction in systolic function, RVSP 37,8 mmHg, LA with severe dilatation, RA with moderate dilatation, mitral valve replaced with normal function, mild to moderate TR, mild to moderate AS,   Urine output is documented at 400 ml  Systolic blood pressure 100 mmHg range.   Improved volume status, continue SGLT 2 inh Patient intolerant to spironolactone   Hold on losartan  due to risk of hypotension Continue midodrine  for blood pressure support.  Continue to hold on loop diuretic for now.

## 2024-04-21 NOTE — Assessment & Plan Note (Signed)
 Nutritional supplements

## 2024-04-21 NOTE — Assessment & Plan Note (Signed)
 Resume diazepam

## 2024-04-21 NOTE — Progress Notes (Signed)
  Progress Note   Patient: Catherine [name] FMW:[id number] DOB: 01/11/1935 DOA: 04/15/2024     5 DOS: the patient was seen and examined on 04/21/2024   Brief hospital course: 88/F with history of SVT, junctional tachycardia, due for ablation in September, paroxysmal A-fib on Coumadin , bioprosthetic MVR, CABG, diastolic CHF, COPD, chronic back pain, debility presented to the ED with cough, worsening shortness of breath and edema. Her proBNP elevated, admitted to TRH service for CHF exacerbation.   Assessment and Plan: * Acute on chronic diastolic CHF (congestive heart failure) (HCC) Echocardiogram with preserved LV systolic function with EF 65 to 70%, moderate LVH, RV with mild reduction in systolic function, RVSP 37,8 mmHg, LA with severe dilatation, RA with moderate dilatation, mitral valve replaced with normal function, mild to moderate TR, mild to moderate AS,   Urine output is 850 ml  Systolic blood pressure 110 mmHg range.   Plan to resume diuresis with furosemide  60 mg IV bid Add SGLT inh  Patient intolerant to spironolactone   Hold on losartan  due to risk of hypotension Continue midodrine  for blood pressure support.   Essential hypertension Continue midodrine  for blood pressure support.   Atrial fibrillation, chronic (HCC) Continue telemetry monitoring Anticoagulation with reduced dose apixaban  Possible re trail of bisoprolol  during this hospitalization   CKD stage 3a, GFR 45-59 ml/min (HCC) Positive proteinuria  Will check protein/ creatinine ration  Add SGLT 2 inh Hold on ARB due to risk of hypotension.   COPD  GOLD 2 No signs of acute exacerbation  Protein-calorie malnutrition, severe Consult nutrition for evaluation   Chronic anxiety Resume diazepam .       Subjective: patient continue to have lower extremity edema, dyspnea and PND   Physical Exam: Vitals:   04/20/24 1943 04/20/24 2355 04/21/24 0423 04/21/24 0808  BP: (!) 106/57 (!) 102/58 (!) 103/53 112/63   Pulse: (!) 59 60 79 64  Resp: 18 16 18 16   Temp: 98.6 F (37 C) 98.4 F (36.9 C) 98.5 F (36.9 C) 98.6 F (37 C)  TempSrc: Oral Oral Oral Oral  SpO2: 96% 96% (!) 88% 91%  Weight:      Height:       Neurology awake and alert, deconditioned ENT with mild pallor Cardiovascular with S1 and S2 present and regular with no gallops or rubs, positive systolic murmur at the right lower sternal border Mild JVD Positive lower extremity edema +++ pitting bilaterally Respiratory with bilateral rales at bases with no wheezing or rhonchi  Abdomen soft, non tender and not distended Left distal medial leg with superficial wound  Data Reviewed:    Family Communication: no family at the bedside   Disposition: Status is: Inpatient Remains inpatient appropriate because: IV diuresis   Planned Discharge Destination: Home    Author: Elidia Toribio Furnace, MD 04/21/2024 11:30 AM  For on call review [email or web address].

## 2024-04-21 NOTE — Plan of Care (Signed)
 ?  Problem: Clinical Measurements: ?Goal: Respiratory complications will improve ?Outcome: Progressing ?Goal: Cardiovascular complication will be avoided ?Outcome: Progressing ?  ?Problem: Safety: ?Goal: Ability to remain free from injury will improve ?Outcome: Progressing ?  ?

## 2024-04-21 NOTE — Assessment & Plan Note (Signed)
 PS MAZE 2018  Sinus node dysfunction sp pacemaker 08/2023   Anticoagulation with reduced dose apixaban .  Rhythm atrial fibrillation with ventricular pacing, intermittent atrial pacing.  Burst of SVT, junctional tachycardia, plan for ablation, follow up with EP as outpatient.

## 2024-04-21 NOTE — Assessment & Plan Note (Signed)
Continue midodrine for blood pressure support.  

## 2024-04-21 NOTE — Assessment & Plan Note (Signed)
Old records personally reviewed, neurology follow up from 06/2021. Seropositive ocular myasthenia gravis, no significant bulbar, or limb weakness noted.  He is not longer on Mestinon or prednisone, never treated with long term steroids sparing agent.   

## 2024-04-21 NOTE — Hospital Course (Signed)
 Catherine [name] was admitted to the hospital with the working diagnosis of heart failure exacerbation.   88/F with history of SVT, junctional tachycardia, paroxysmal A-fib bioprosthetic MVR, CABG, diastolic CHF, COPD, chronic back pain, and debility presented to the ED with cough, worsening dyspnea and edema. Reported 24 hrs of worsening symptoms despite increasing dose of bumetanide  at home.  On her initial physical examination her blood pressure was 113/51, HR 59, RR 15 and 02 saturation 93% Lungs with bilateral rales with no wheezing, heart with S1 and S2 present and regular, abdomen with no distention and positive +++ bilateral lower extremity edema.   Na 137, K 2,8 Cl 97 bicarbonate 26 glucose 134 bun 29 cr 1,32  BNP 14,671  High sensitive troponin 135 and 129  Lactic acid 1,5 and 2,0  Wbc 9,5 hgb 13.2 plt 146  Urine analysis SG 1,013, protein > 300, negative leukocytes and negative Hgb   Chest radiograph with hyperinflation, no cardiomegaly, prominent pulmonary vasculature, positive small right pleural effusion, no infiltrates.  EKG 76 bpm, normal axis, normal intervals, qtc 441, atrial fibrillation rhythm with ventricular pacing, no ST segment changes, negative T waves lead I, aVL, V4 to V6.   Patient was placed on furosemide  for diuresis, limited therapy due to hypotension.  Positive proteinuria nephrotic range.   07/29 thoracentesis right 1.1 L removed.  07/30 improved volume status, patient will follow up as outpatient and continue bumetanide  as needed as outpatient. Use compression socks for peripheral edema.

## 2024-04-21 NOTE — Assessment & Plan Note (Signed)
 Positive proteinuria  Will check protein/ creatinine ration  Add SGLT 2 inh Hold on ARB due to risk of hypotension.

## 2024-04-21 NOTE — Plan of Care (Signed)

## 2024-04-22 DIAGNOSIS — I482 Chronic atrial fibrillation, unspecified: Secondary | ICD-10-CM | POA: Diagnosis not present

## 2024-04-22 DIAGNOSIS — N1831 Chronic kidney disease, stage 3a: Secondary | ICD-10-CM | POA: Diagnosis not present

## 2024-04-22 DIAGNOSIS — I5033 Acute on chronic diastolic (congestive) heart failure: Secondary | ICD-10-CM | POA: Diagnosis not present

## 2024-04-22 DIAGNOSIS — I10 Essential (primary) hypertension: Secondary | ICD-10-CM | POA: Diagnosis not present

## 2024-04-22 LAB — BASIC METABOLIC PANEL WITH GFR
Anion gap: 11 (ref 5–15)
BUN: 37 mg/dL — ABNORMAL HIGH (ref 8–23)
CO2: 27 mmol/L (ref 22–32)
Calcium: 9.6 mg/dL (ref 8.9–10.3)
Chloride: 98 mmol/L (ref 98–111)
Creatinine, Ser: 1.29 mg/dL — ABNORMAL HIGH (ref 0.44–1.00)
GFR, Estimated: 40 mL/min — ABNORMAL LOW (ref >60)
Glucose, Bld: 99 mg/dL (ref 70–99)
Potassium: 3.7 mmol/L (ref 3.5–5.1)
Sodium: 136 mmol/L (ref 135–145)

## 2024-04-22 LAB — MAGNESIUM: Magnesium: 2.2 mg/dL (ref 1.7–2.4)

## 2024-04-22 NOTE — Plan of Care (Signed)
  Problem: Clinical Measurements: Goal: Will remain free from infection Outcome: Progressing Goal: Diagnostic test results will improve Outcome: Progressing Goal: Respiratory complications will improve Outcome: Progressing Goal: Cardiovascular complication will be avoided Outcome: Progressing   Problem: Activity: Goal: Risk for activity intolerance will decrease Outcome: Progressing   Problem: Nutrition: Goal: Adequate nutrition will be maintained Outcome: Progressing   Problem: Coping: Goal: Level of anxiety will decrease Outcome: Progressing   Problem: Pain Managment: Goal: General experience of comfort will improve and/or be controlled Outcome: Progressing   Problem: Safety: Goal: Ability to remain free from injury will improve Outcome: Progressing

## 2024-04-22 NOTE — Progress Notes (Addendum)
 Progress Note   Patient: Catherine [name] FMW:[id number] DOB: [birth year] DOA: 04/15/2024     6 DOS: the patient was seen and examined on 04/22/2024   Brief hospital course: [name] was admitted to the hospital with the working diagnosis of heart failure exacerbation.   88/F with history of SVT, junctional tachycardia, paroxysmal A-fib bioprosthetic MVR, CABG, diastolic CHF, COPD, chronic back pain, and debility presented to the ED with cough, worsening dyspnea and edema. Reported 24 hrs of worsening symptoms despite increasing dose of bumetanide  at home.  On her initial physical examination her blood pressure was 113/51, HR 59, RR 15 and 02 saturation 93% Lungs with bilateral rales with no wheezing, heart with S1 and S2 present and regular, abdomen with no distention and positive +++ bilateral lower extremity edema.   Na 137, K 2,8 Cl 97 bicarbonate 26 glucose 134 bun 29 cr 1,32  BNP 14,671  High sensitive troponin 135 and 129  Lactic acid 1,5 and 2,0  Wbc 9,5 hgb 13.2 plt 146  Urine analysis SG 1,013, protein > 300, negative leukocytes and negative Hgb   Chest radiograph with hyperinflation, no cardiomegaly, prominent pulmonary vasculature, positive small right pleural effusion, no infiltrates.  EKG 76 bpm, normal axis, normal intervals, qtc 441, atrial fibrillation rhythm with ventricular pacing, no ST segment changes, negative T waves lead I, aVL, V4 to V6.   Assessment and Plan: * Acute on chronic diastolic CHF (congestive heart failure) (HCC) Echocardiogram with preserved LV systolic function with EF 65 to 70%, moderate LVH, RV with mild reduction in systolic function, RVSP 37,8 mmHg, LA with severe dilatation, RA with moderate dilatation, mitral valve replaced with normal function, mild to moderate TR, mild to moderate AS,   Urine output is 1,400 ml  Systolic blood pressure 100 mmHg range.   Improved volume status, will hold on IV furosemide  for now and will continue SGLT 2  inh Possible resuming po bumetanide  in am.  Patient intolerant to spironolactone   Hold on losartan  due to risk of hypotension Continue midodrine  for blood pressure support.   Essential hypertension Continue midodrine  for blood pressure support.   Atrial fibrillation, chronic (HCC) Continue telemetry monitoring Anticoagulation with reduced dose apixaban  Possible re trail of bisoprolol  during this hospitalization   CKD stage 3a, GFR 45-59 ml/min (HCC) Nephrotic syndrome.   Renal function with serum cr at 1,29 with K at 3,7 and serum bicarbonate at 27  Na 136 and Mg 2.2  Urine protein creatinine ration of 11,4 consistent with nephrotic range proteinuria.   Continue diuresis as tolerated.  Possible addition of losartan  when renal function more stable.   COPD  GOLD 2 No signs of acute exacerbation  Protein-calorie malnutrition, severe Consult nutrition for evaluation   Chronic anxiety Resume diazepam .       Subjective: patient with improvement in lower extremity edema and dyspnea, today seating in the chair at the side of the bed.   Physical Exam: Vitals:   04/22/24 0257 04/22/24 0534 04/22/24 0828 04/22/24 1223  BP:  (!) 92/51 (!) 86/52 (!) 106/51  Pulse: 62 60 93 64  Resp: 16 18 18 18   Temp:  97.8 F (36.6 C) 98.5 F (36.9 C) 98.1 F (36.7 C)  TempSrc:  Oral Oral Oral  SpO2:  93% 90% 92%  Weight:      Height:       Neurology awake and alert ENT with mild pallor with no icterus Cardiovascular with S1 and S2 present and regular with mild  systolic murmur at the apex and base  No JVD Respiratory with no rales or wheezing, no rhonchi  Abdomen with no distention  Improved lower extremity edema, today with + bilaterally  Data Reviewed:    Family Communication: no family at the beside   Disposition: Status is: Inpatient Remains inpatient appropriate because: recovering heart failure   Planned Discharge Destination: Home    Author: Elidia Toribio Furnace,  MD 04/22/2024 1:06 PM  For on call review [email or web address].

## 2024-04-22 NOTE — Plan of Care (Signed)
  Problem: Clinical Measurements: Goal: Respiratory complications will improve Outcome: Progressing Goal: Cardiovascular complication will be avoided Outcome: Progressing   Problem: Activity: Goal: Risk for activity intolerance will decrease Outcome: Progressing   Problem: Nutrition: Goal: Adequate nutrition will be maintained Outcome: Progressing   Problem: Pain Managment: Goal: General experience of comfort will improve and/or be controlled Outcome: Progressing   Problem: Safety: Goal: Ability to remain free from injury will improve Outcome: Progressing

## 2024-04-23 ENCOUNTER — Inpatient Hospital Stay (HOSPITAL_COMMUNITY)

## 2024-04-23 DIAGNOSIS — I5033 Acute on chronic diastolic (congestive) heart failure: Secondary | ICD-10-CM | POA: Diagnosis not present

## 2024-04-23 DIAGNOSIS — I10 Essential (primary) hypertension: Secondary | ICD-10-CM | POA: Diagnosis not present

## 2024-04-23 DIAGNOSIS — N1831 Chronic kidney disease, stage 3a: Secondary | ICD-10-CM | POA: Diagnosis not present

## 2024-04-23 DIAGNOSIS — I482 Chronic atrial fibrillation, unspecified: Secondary | ICD-10-CM | POA: Diagnosis not present

## 2024-04-23 LAB — COMPREHENSIVE METABOLIC PANEL WITH GFR
ALT: 12 U/L (ref 0–44)
AST: 27 U/L (ref 15–41)
Albumin: 2.2 g/dL — ABNORMAL LOW (ref 3.5–5.0)
Alkaline Phosphatase: 67 U/L (ref 38–126)
Anion gap: 9 (ref 5–15)
BUN: 42 mg/dL — ABNORMAL HIGH (ref 8–23)
CO2: 27 mmol/L (ref 22–32)
Calcium: 9.3 mg/dL (ref 8.9–10.3)
Chloride: 97 mmol/L — ABNORMAL LOW (ref 98–111)
Creatinine, Ser: 1.38 mg/dL — ABNORMAL HIGH (ref 0.44–1.00)
GFR, Estimated: 37 mL/min — ABNORMAL LOW (ref >60)
Glucose, Bld: 128 mg/dL — ABNORMAL HIGH (ref 70–99)
Potassium: 4.2 mmol/L (ref 3.5–5.1)
Sodium: 133 mmol/L — ABNORMAL LOW (ref 135–145)
Total Bilirubin: 0.4 mg/dL (ref 0.0–1.2)
Total Protein: 4.9 g/dL — ABNORMAL LOW (ref 6.5–8.1)

## 2024-04-23 LAB — CULTURE, BLOOD (ROUTINE X 2)
Culture: NO GROWTH
Culture: NO GROWTH

## 2024-04-23 LAB — BASIC METABOLIC PANEL WITH GFR
Anion gap: 12 (ref 5–15)
BUN: 43 mg/dL — ABNORMAL HIGH (ref 8–23)
CO2: 28 mmol/L (ref 22–32)
Calcium: 9.8 mg/dL (ref 8.9–10.3)
Chloride: 94 mmol/L — ABNORMAL LOW (ref 98–111)
Creatinine, Ser: 1.46 mg/dL — ABNORMAL HIGH (ref 0.44–1.00)
GFR, Estimated: 35 mL/min — ABNORMAL LOW (ref >60)
Glucose, Bld: 151 mg/dL — ABNORMAL HIGH (ref 70–99)
Potassium: 4.4 mmol/L (ref 3.5–5.1)
Sodium: 134 mmol/L — ABNORMAL LOW (ref 135–145)

## 2024-04-23 MED ORDER — BISACODYL 10 MG RE SUPP: 10.0000 mg | Freq: Once | RECTAL | Status: DC

## 2024-04-23 NOTE — Plan of Care (Signed)
  Problem: Clinical Measurements: Goal: Ability to maintain clinical measurements within normal limits will improve Outcome: Progressing Goal: Diagnostic test results will improve Outcome: Progressing Goal: Respiratory complications will improve Outcome: Progressing Goal: Cardiovascular complication will be avoided Outcome: Progressing   Problem: Coping: Goal: Level of anxiety will decrease Outcome: Progressing   Problem: Elimination: Goal: Will not experience complications related to bowel motility Outcome: Progressing   Problem: Pain Managment: Goal: General experience of comfort will improve and/or be controlled Outcome: Progressing   Problem: Skin Integrity: Goal: Risk for impaired skin integrity will decrease Outcome: Progressing

## 2024-04-23 NOTE — Progress Notes (Addendum)
 Rounding Note   Patient Name: Catherine [name] Date of Encounter: 04/23/2024  [facility] Cardiologist: Annabella Scarce, MD   Subjective  Pt still feels SOB    Says she has had a bad year since pacer was placed   hasn't felt like doing anything  No energy   No appetite  She does feel palptiations     Scares her   Scheduled Meds:  albuterol   2.5 mg Nebulization Q6H   apixaban   2.5 mg Oral BID   diazepam   5 mg Oral QHS   empagliflozin   10 mg Oral Daily   feeding supplement  237 mL Oral BID BM   lactose free nutrition  237 mL Oral BID BM   midodrine   5 mg Oral BID WC   multivitamin with minerals  1 tablet Oral Daily   mupirocin  cream   Topical Daily   polyethylene glycol  17 g Oral QHS   zinc  sulfate (50mg  elemental zinc )  220 mg Oral Daily   Continuous Infusions:  PRN Meds: HYDROcodone -acetaminophen , ipratropium-albuterol , ondansetron  (ZOFRAN ) IV, senna   Vital Signs  Vitals:   04/22/24 2120 04/22/24 2344 04/23/24 0549 04/23/24 0817  BP: (!) 108/55 (!) 107/52 94/61 (!) 109/52  Pulse: 62 61 60 61  Resp: 16 15 16 17   Temp: 98.5 F (36.9 C) 98.2 F (36.8 C) 98.5 F (36.9 C) 97.7 F (36.5 C)  TempSrc: Oral Oral Oral Oral  SpO2: 94% 92% 93%   Weight:      Height:        Intake/Output Summary (Last 24 hours) at 04/23/2024 0858 Last data filed at 04/22/2024 2157 Gross per 24 hour  Intake 237 ml  Output 400 ml  Net -163 ml   Positive 1 L        04/15/2024    3:29 PM 04/13/2024    3:59 PM 04/12/2024   10:13 AM  Last 3 Weights  Weight (lbs) 115 lb 117 lb 120 lb 9.6 oz  Weight (kg) 52.164 kg 53.071 kg 54.704 kg      Telemetry  AV paced with brief episosdes of SVT  - Personally Reviewed  ECG  No new  - Personally Reviewed  Physical Exam  GEN: No acute distress.   Neck: JVP is increased  Cardiac: RRR, III/VI systlic murmur LSB    Respiratory: Clear to auscultation anteriorly  GI: Soft, nontender, non-distended  MS: 2+ :E edema  Chronic skin  changes     Skin is very thin/friable. Neuro:  Nonfocal  Psych: Normal affect   Labs High Sensitivity Troponin:  No results for input(s): TROPONINIHS in the last 720 hours.   Chemistry Recent Labs  Lab 04/17/24 0531 04/18/24 0453 04/19/24 0411 04/21/24 0211 04/22/24 0458  NA 136   < > 136 134* 136  K 4.4   < > 4.4 3.9 3.7  CL 103   < > 102 101 98  CO2 25   < > 28 27 27   GLUCOSE 111*   < > 97 103* 99  BUN 24*   < > 32* 39* 37*  CREATININE 1.01*   < > 1.10* 1.08* 1.29*  CALCIUM  8.8*   < > 9.1 9.2 9.6  MG 2.0  --   --   --  2.2  GFRNONAA 54*   < > 49* 50* 40*  ANIONGAP 8   < > 6 6 11    < > = values in this interval not displayed.    Lipids No results  for input(s): CHOL, TRIG, HDL, LABVLDL, LDLCALC, CHOLHDL in the last 168 hours.  Hematology Recent Labs  Lab 04/17/24 0531 04/18/24 0453 04/19/24 0411  WBC 6.1 5.7 6.4  RBC 3.42* 3.27* 3.35*  HGB 10.4* 10.0* 10.4*  HCT 30.5* 29.9* 30.6*  MCV 89.2 91.4 91.3  MCH 30.4 30.6 31.0  MCHC 34.1 33.4 34.0  RDW 13.3 13.4 13.5  PLT 116* 113* 125*   Thyroid  No results for input(s): TSH, FREET4 in the last 168 hours.  BNPNo results for input(s): BNP, PROBNP in the last 168 hours.  DDimer No results for input(s): DDIMER in the last 168 hours.   Radiology  No results found.  Cardiac Studies  Echo   Feb 2025  1. Left ventricular ejection fraction, by estimation, is 65 to 70%. The  left ventricle has normal function. The left ventricle has no regional  wall motion abnormalities. There is moderate left ventricular hypertrophy.  Left ventricular diastolic  parameters are indeterminate.   2. Right ventricular systolic function is mildly reduced. The right  ventricular size is mildly enlarged. There is mildly elevated pulmonary  artery systolic pressure. The estimated right ventricular systolic  pressure is 37.8 mmHg.   3. Left atrial size was severely dilated.   4. Right atrial size was moderately  dilated.   5. The mitral valve has been repaired/replaced. Trivial mitral valve  regurgitation. The mean mitral valve gradient is 3.6 mmHg. There is a 29  mm Medtronic Mosaic Porcine bioprosthetic valve present in the mitral  position. Procedure Date: 10/14/2016. Echo   findings are consistent with normal structure and function of the mitral  valve prosthesis.   6. Tricuspid valve regurgitation is mild to moderate.   7. The aortic valve is abnormal. There is moderate calcification of the  aortic valve. Aortic valve regurgitation is not visualized. Mild-Moderate  aortic valve stenosis. Aortic valve area, by VTI measures 0.85 cm. Aortic  valve mean gradient measures  15.3 mmHg. Aortic valve Vmax measures 2.74 m/s. Aortic valve acceleration  time measures 90 msec. DVI 0.48.   8. The inferior vena cava is dilated in size with >50% respiratory  variability, suggesting right atrial pressure of 8 mmHg.   Patient Profile   89 y.o. female   Assessment & Plan   1 CHF   Pt with hx of normal LVEF, mildly reduced RVEF   Presents with volume overload  Pt says it has been developing for at least a couple months    On IV lasix  here   It is on hold   Still with volume overload   Note albumen was 2.3 prior to admit (repeating)   I have reviewed echo from Feb 2025  AV is mildly narrowed   There is significant subvalvular turbulance and parasternal views show SAM of MV     Difficult windows  Given decline after echo would order echo to reevaluate LV/RV today       2  Hx PAF  hx surgical MAZE 2018  CUrrently on Eliquis  bid  3 Hx SN dysfunction   S/P PPM in Dec 2024    4  Hx SVT  Pt continues to have bursts of SVT (Junctional tachy)  Note that ablation had been planned for Aug   She has been intolerant to meds in past       5  Hypotension  Pt started on midodrine  several days ago   BP 80s to 110s     6 CAD  s/p CABG 2018 (  SVG to RCA)     No symptoms of angina   7  Hx MV dz  (s/p commissurotomy  1970s; s/p MV bioprosthesis 2018)  8  Hx AS  Mild/mod by echo in Feb 2025  Mean gradient 15  9  Hx TR  Reported mild to mod on echo in Feb 2025    For questions or updates, please contact [facility] Please consult [email or web address] for contact info under     Signed, Vina Gull, MD  04/23/2024, 8:58 AM

## 2024-04-23 NOTE — Progress Notes (Addendum)
 Progress Note   Patient: Catherine [name] FMW:[id number] DOB: 1935/06/22 DOA: 04/15/2024     7 DOS: the patient was seen and examined on 04/23/2024   Brief hospital course: [name] was admitted to the hospital with the working diagnosis of heart failure exacerbation.   88/F with history of SVT, junctional tachycardia, paroxysmal A-fib bioprosthetic MVR, CABG, diastolic CHF, COPD, chronic back pain, and debility presented to the ED with cough, worsening dyspnea and edema. Reported 24 hrs of worsening symptoms despite increasing dose of bumetanide  at home.  On her initial physical examination her blood pressure was 113/51, HR 59, RR 15 and 02 saturation 93% Lungs with bilateral rales with no wheezing, heart with S1 and S2 present and regular, abdomen with no distention and positive +++ bilateral lower extremity edema.   Na 137, K 2,8 Cl 97 bicarbonate 26 glucose 134 bun 29 cr 1,32  BNP 14,671  High sensitive troponin 135 and 129  Lactic acid 1,5 and 2,0  Wbc 9,5 hgb 13.2 plt 146  Urine analysis SG 1,013, protein > 300, negative leukocytes and negative Hgb   Chest radiograph with hyperinflation, no cardiomegaly, prominent pulmonary vasculature, positive small right pleural effusion, no infiltrates.  EKG 76 bpm, normal axis, normal intervals, qtc 441, atrial fibrillation rhythm with ventricular pacing, no ST segment changes, negative T waves lead I, aVL, V4 to V6.   Patient was placed on furosemide  for diuresis, limited therapy due to hypotension.  Positive proteinuria nephrotic range.   Assessment and Plan: * Acute on chronic diastolic CHF (congestive heart failure) (HCC) Echocardiogram with preserved LV systolic function with EF 65 to 70%, moderate LVH, RV with mild reduction in systolic function, RVSP 37,8 mmHg, LA with severe dilatation, RA with moderate dilatation, mitral valve replaced with normal function, mild to moderate TR, mild to moderate AS,   Urine output is documented at  400 ml  Systolic blood pressure 100 mmHg range.   Improved volume status, continue SGLT 2 inh Patient intolerant to spironolactone   Hold on losartan  due to risk of hypotension Continue midodrine  for blood pressure support.  Continue to hold on loop diuretic for now.   Essential hypertension Continue midodrine  for blood pressure support.   Atrial fibrillation, chronic (HCC) Anticoagulation with reduced dose apixaban .  Rhythm atrial fibrillation with ventricular pacing, intermittent atrial pacing.  Possible re trail of bisoprolol  during this hospitalization   CKD stage 3a, GFR 45-59 ml/min (HCC) Nephrotic syndrome. Hyponatremia. AKI  Urine protein creatinine ration of 11,4 consistent with nephrotic range proteinuria.   Follow up renal function with serum cr at 1,46 with K at 4,4 and serum bicarbonate at 28  Na 134   Will do Spep, Upep and free light chain, if negative may need renal biopsy, discussed with nephrology.   COPD  GOLD 2 No signs of acute exacerbation  Protein-calorie malnutrition, severe Consult nutrition for evaluation   Chronic anxiety Resume diazepam .        Subjective: Patient complains of dyspnea, edema has improved, continue very weak and deconditioned, reports palpitations,   Physical Exam: Vitals:   04/22/24 2120 04/22/24 2344 04/23/24 0549 04/23/24 0817  BP: (!) 108/55 (!) 107/52 94/61 (!) 109/52  Pulse: 62 61 60 61  Resp: 16 15 16 17   Temp: 98.5 F (36.9 C) 98.2 F (36.8 C) 98.5 F (36.9 C) 97.7 F (36.5 C)  TempSrc: Oral Oral Oral Oral  SpO2: 94% 92% 93%   Weight:      Height:  Neurology awake and alert ENT with mild pallor Cardiovascular with S1 and S2 present and regular with no gallops or rubs, positive systolic murmur at the right lower sternal border and base,  No JVD Respiratory with rales at bases with no wheezing, no increased work of breathing Abdomen with no distention  Positive lower extremity edema pitting +  Data  Reviewed:    Family Communication: no family at the bedside   Disposition: Status is: Inpatient Remains inpatient appropriate because: diuresis   Planned Discharge Destination: Home    Author: Elidia Toribio Furnace, MD 04/23/2024 8:41 AM  For on call review [email or web address].

## 2024-04-23 NOTE — Progress Notes (Signed)
 Mobility Specialist Progress Note;   04/23/24 1427  Mobility  Activity Ambulated with assistance in hallway  Level of Assistance Contact guard assist, steadying assist  Assistive Device None  Distance Ambulated (ft) 250 ft  Activity Response Tolerated well  Mobility Referral Yes  Mobility visit 1 Mobility  Mobility Specialist Start Time (ACUTE ONLY) 1427  Mobility Specialist Stop Time (ACUTE ONLY) 1450  Mobility Specialist Time Calculation (min) (ACUTE ONLY) 23 min   Pt agreeable to mobility. Required light MinG assistance during ambulation d/t minor LOB. C/o BLE pain and SOB. VSS throughout. Pt returned back to chair and left with all needs met, call bell in reach.   Lauraine Erm Mobility Specialist Please contact via SecureChat or [employer] [phone]

## 2024-04-24 ENCOUNTER — Inpatient Hospital Stay (HOSPITAL_COMMUNITY)

## 2024-04-24 DIAGNOSIS — I10 Essential (primary) hypertension: Secondary | ICD-10-CM | POA: Diagnosis not present

## 2024-04-24 DIAGNOSIS — I5031 Acute diastolic (congestive) heart failure: Secondary | ICD-10-CM | POA: Diagnosis not present

## 2024-04-24 DIAGNOSIS — I5033 Acute on chronic diastolic (congestive) heart failure: Secondary | ICD-10-CM | POA: Diagnosis not present

## 2024-04-24 DIAGNOSIS — I482 Chronic atrial fibrillation, unspecified: Secondary | ICD-10-CM | POA: Diagnosis not present

## 2024-04-24 DIAGNOSIS — N1831 Chronic kidney disease, stage 3a: Secondary | ICD-10-CM | POA: Diagnosis not present

## 2024-04-24 LAB — ECHOCARDIOGRAM COMPLETE
AR max vel: 1.09 cm2
AV Area VTI: 1.04 cm2
AV Area mean vel: 1.05 cm2
AV Mean grad: 19 mmHg
AV Peak grad: 36.5 mmHg
Ao pk vel: 3.02 m/s
Area-P 1/2: 2.34 cm2
Height: 63 in
MV VTI: 0.94 cm2
S' Lateral: 3.08 cm
Weight: 1840 [oz_av]

## 2024-04-24 LAB — RENAL FUNCTION PANEL
Albumin: 2 g/dL — ABNORMAL LOW (ref 3.5–5.0)
Anion gap: 8 (ref 5–15)
BUN: 43 mg/dL — ABNORMAL HIGH (ref 8–23)
CO2: 29 mmol/L (ref 22–32)
Calcium: 9.4 mg/dL (ref 8.9–10.3)
Chloride: 98 mmol/L (ref 98–111)
Creatinine, Ser: 1.47 mg/dL — ABNORMAL HIGH (ref 0.44–1.00)
GFR, Estimated: 35 mL/min — ABNORMAL LOW (ref >60)
Glucose, Bld: 96 mg/dL (ref 70–99)
Phosphorus: 3.8 mg/dL (ref 2.5–4.6)
Potassium: 4.6 mmol/L (ref 3.5–5.1)
Sodium: 135 mmol/L (ref 135–145)

## 2024-04-24 LAB — GRAM STAIN

## 2024-04-24 LAB — GLUCOSE, PLEURAL OR PERITONEAL FLUID: Glucose, Fluid: 112 mg/dL

## 2024-04-24 LAB — KAPPA/LAMBDA LIGHT CHAINS
Kappa free light chain: 1234.4 mg/L — ABNORMAL HIGH (ref 3.3–19.4)
Kappa, lambda light chain ratio: 55.35 — ABNORMAL HIGH (ref 0.26–1.65)
Lambda free light chains: 22.3 mg/L (ref 5.7–26.3)

## 2024-04-24 LAB — PROTEIN, PLEURAL OR PERITONEAL FLUID: Total protein, fluid: <3 g/dL

## 2024-04-24 LAB — LACTATE DEHYDROGENASE, PLEURAL OR PERITONEAL FLUID: LD, Fluid: 28 U/L — ABNORMAL HIGH (ref 3–23)

## 2024-04-24 MED ORDER — LIDOCAINE-EPINEPHRINE 1 %-1:100000 IJ SOLN
INTRAMUSCULAR | Status: AC
  Filled 2024-04-24: qty 1

## 2024-04-24 MED ORDER — LIDOCAINE-EPINEPHRINE 1 %-1:100000 IJ SOLN
20.0000 mL | Freq: Once | INTRAMUSCULAR | Status: AC
  Administered 2024-04-24: 10 mL

## 2024-04-24 MED ORDER — BOOST / RESOURCE BREEZE PO LIQD CUSTOM
1.0000 | Freq: Two times a day (BID) | ORAL | Status: DC
  Administered 2024-04-24 – 2024-04-25 (×2): 1 via ORAL

## 2024-04-24 NOTE — Procedures (Signed)
 PROCEDURE SUMMARY:  Successful image-guided right thoracentesis. Yielded 1.1 liters of clear yellow fluid. Patient tolerated procedure well. EBL: trace No immediate complications.  Specimen was sent for labs. Post procedure CXR ordered.  Please see imaging section of Epic for full dictation.  Kimble DEL Cohen Doleman PA-C 04/24/2024 2:15 PM

## 2024-04-24 NOTE — Progress Notes (Signed)
 Progress Note   Patient: Catherine [name] FMW:[id number] DOB: 31-Jan-1935 DOA: 04/15/2024     8 DOS: the patient was seen and examined on 04/24/2024   Brief hospital course: Catherine [name] was admitted to the hospital with the working diagnosis of heart failure exacerbation.   88/F with history of SVT, junctional tachycardia, paroxysmal A-fib bioprosthetic MVR, CABG, diastolic CHF, COPD, chronic back pain, and debility presented to the ED with cough, worsening dyspnea and edema. Reported 24 hrs of worsening symptoms despite increasing dose of bumetanide  at home.  On her initial physical examination her blood pressure was 113/51, HR 59, RR 15 and 02 saturation 93% Lungs with bilateral rales with no wheezing, heart with S1 and S2 present and regular, abdomen with no distention and positive +++ bilateral lower extremity edema.   Na 137, K 2,8 Cl 97 bicarbonate 26 glucose 134 bun 29 cr 1,32  BNP 14,671  High sensitive troponin 135 and 129  Lactic acid 1,5 and 2,0  Wbc 9,5 hgb 13.2 plt 146  Urine analysis SG 1,013, protein > 300, negative leukocytes and negative Hgb   Chest radiograph with hyperinflation, no cardiomegaly, prominent pulmonary vasculature, positive small right pleural effusion, no infiltrates.  EKG 76 bpm, normal axis, normal intervals, qtc 441, atrial fibrillation rhythm with ventricular pacing, no ST segment changes, negative T waves lead I, aVL, V4 to V6.   Patient was placed on furosemide  for diuresis, limited therapy due to hypotension.  Positive proteinuria nephrotic range.   07/29 thoracentesis right 1.1 L removed.   Assessment and Plan: * Acute on chronic diastolic CHF (congestive heart failure) (HCC) Echocardiogram with preserved LV systolic function with EF 65 to 70%, moderate LVH, RV with mild reduction in systolic function, RVSP 37,8 mmHg, LA with severe dilatation, RA with moderate dilatation, mitral valve replaced with normal function, mild to moderate TR, mild to  moderate AS,   Not documented urine output  Systolic blood pressure 100 mmHg range.   Improved volume status, continue SGLT 2 inh Patient intolerant to spironolactone   Hold on losartan  due to risk of hypotension Continue midodrine  for blood pressure support.  Continue to hold on loop diuretic for now.   Essential hypertension Continue midodrine  for blood pressure support.   Atrial fibrillation, chronic (HCC) Anticoagulation with reduced dose apixaban .  Rhythm atrial fibrillation with ventricular pacing, intermittent atrial pacing.  Possible re trail of bisoprolol  during this hospitalization   CKD stage 3a, GFR 45-59 ml/min (HCC) Nephrotic syndrome. Hyponatremia. AKI  Urine protein creatinine ration of 11,4 consistent with nephrotic range proteinuria.   Renal function today with serum cr at 1,47 with K at 4,6 and serum bicarbonate at 29  Na 135 P 3,8   Free kappa/ lambda ration is very elevated at 55.35, pending electrophoresis   COPD  GOLD 2 No signs of acute exacerbation  Protein-calorie malnutrition, severe Consult nutrition for evaluation   Chronic anxiety Resume diazepam .      Subjective: Patient with no chest pain, continue to have dyspnea and lower extremity edema  Physical Exam: Vitals:   04/24/24 1218 04/24/24 1300 04/24/24 1407 04/24/24 1500  BP: (!) 103/55 (!) 129/58 139/64 (!) 111/53  Pulse: 62   66  Resp: 16   18  Temp: 98.3 F (36.8 C)   98.4 F (36.9 C)  TempSrc: Oral   Oral  SpO2: 94%   95%  Weight:      Height:       Neurology awake and alert ENT With mild  pallor Cardiovascular with S1 and S2 present and regular with no gallops, rubs or murmurs No JVD Respiratory with no rales or wheezing, no rhonchi  Abdomen with no distention  Lower extremity edema + to ++ pitting  Data Reviewed:    Family Communication: no family at the bedside   Disposition: Status is: Inpatient Remains inpatient appropriate because: recovering heart failure    Planned Discharge Destination: Home      Author: Elidia Toribio Furnace, MD 04/24/2024 5:31 PM  For on call review [email or web address].

## 2024-04-24 NOTE — Progress Notes (Signed)
 Nutrition Follow-up  DOCUMENTATION CODES:   Severe malnutrition in context of chronic illness  INTERVENTION:  -Recommend liberalizing back to 2g Na+ menu -Continue Ensure Plus High Protein BID, Boost BID as pt likes to mix them -Continue Magic Cup BID -Continue MVI w/min -Zinc  lab resulted as deficient, added Zinc  Sulfate daily 7/25   NUTRITION DIAGNOSIS:   Severe Malnutrition related to nausea, poor appetite, chronic illness, wound healing as evidenced by percent weight loss, severe fat depletion, severe muscle depletion, energy intake < or equal to 75% for > or equal to 1 month, edema.  Ongoing  GOAL:   Patient will meet greater than or equal to 90% of their needs  Progressing  MONITOR:   PO intake, Supplement acceptance, Labs, Weight trends, Skin  REASON FOR ASSESSMENT:   Consult Assessment of nutrition requirement/status  ASSESSMENT:   Hx atrial fibrillation on Coumadin , pacemaker, severe mitral stenosis status post bioprosthetic mitral valve replacement, coronary artery disease s/p CABG, essential hypertension, hyperlipidemia, HFpEF (EF 60-70% in 2025), chronic back pain, COPD and chronic physical debility  Spoke to pt in room. Pt denies current n/v/c/d or chewing/swallowing difficulties. Pt does have last documented BM 7/25. Pt also states she has been getting Zofran  for nausea, encouraged pt to discuss with MD about continuing antiemetic at home. Pt continues to state feeling poorly, no appetite since pacer placed Dec 2024, weight loss since then. Discussed importance of adequate kcal/pro intake while also balancing sodium/fluid restrictions. Pt does drink ONS at home, however, usually Equate shake with 220 kcal, 9 g of protein and typically one a day. Discussed choosing higher kcal/pro shake for more concentrated kcal/pro and drinking it BID. Pt does not have computer/use smart phone but discussed her daughter that can help her order ONS online if they would like. Pt  continues with 2+ edema, particularly in LLE. No weight since 7/20, added daily weights. Pt was previously liberalized to a 2g Na+ menu, MD changed pt back to a heart healthy menu. Recommend liberalizing back to 2g Na+ menu. Pt is doing her best to drink boost breeze/EPHP mixture BID; if successful that provides 1200 kcal, 58 g protein just from those. Documented PO intake typically 0-25% of all meals. Pt denies additional questions/concerns at this time, will continue to monitor, RDN available prn.   Labs BUN 43 Cr 1.47 Albumin  2.0 GFR 35 Zinc  39 (Low) BG 96-151   Medications  apixaban   2.5 mg Oral BID   bisacodyl   10 mg Rectal Once   diazepam   5 mg Oral QHS   empagliflozin   10 mg Oral Daily   feeding supplement  1 Container Oral BID BM   feeding supplement  237 mL Oral BID BM   midodrine   5 mg Oral BID WC   multivitamin with minerals  1 tablet Oral Daily   mupirocin  cream   Topical Daily   polyethylene glycol  17 g Oral QHS   zinc  sulfate (50mg  elemental zinc )  220 mg Oral Daily    NUTRITION - FOCUSED PHYSICAL EXAM:  Flowsheet Row Most Recent Value  Orbital Region Severe depletion  Upper Arm Region Severe depletion  Thoracic and Lumbar Region Severe depletion  Buccal Region Severe depletion  Temple Region Severe depletion  Clavicle Bone Region Severe depletion  Clavicle and Acromion Bone Region Severe depletion  Scapular Bone Region Severe depletion  Dorsal Hand Severe depletion  Patellar Region Severe depletion  Anterior Thigh Region Severe depletion  Posterior Calf Region Severe depletion  Edema (RD Assessment)  Mild  Hair Reviewed  Eyes Reviewed  Mouth Reviewed  Skin Reviewed  Nails Reviewed    Diet Order:   Diet Order             Diet Heart Fluid consistency: Thin; Fluid restriction: 1200 mL Fluid  Diet effective now                   EDUCATION NEEDS:   Education needs have been addressed  Skin:  Skin Assessment: Skin Integrity Issues: Skin  Integrity Issues:: DTI, Other (Comment) DTI: Sacrum Other: Traumatic injury to LLE, left chest  Last BM:  7/21  Height:   Ht Readings from Last 1 Encounters:  04/15/24 5' 3 (1.6 m)    Weight:   Wt Readings from Last 1 Encounters:  04/15/24 52.2 kg    BMI:  Body mass index is 20.37 kg/m.  Estimated Nutritional Needs:   Kcal:  1300-1575 kcal  Protein:  65-80g  Fluid:  1200 ml fluid rest   Raif Chachere Daml-Budig, RDN, LDN Registered Dietitian Nutritionist RD Inpatient Contact Info in [place]

## 2024-04-24 NOTE — Progress Notes (Signed)
 Echocardiogram 2D Echocardiogram has been performed.  Catherine [name] 04/24/2024, 4:06 PM

## 2024-04-24 NOTE — Plan of Care (Signed)
   Problem: Clinical Measurements: Goal: Will remain free from infection Outcome: Progressing Goal: Diagnostic test results will improve Outcome: Progressing Goal: Respiratory complications will improve Outcome: Progressing Goal: Cardiovascular complication will be avoided Outcome: Progressing   Problem: Activity: Goal: Risk for activity intolerance will decrease Outcome: Progressing   Problem: Nutrition: Goal: Adequate nutrition will be maintained Outcome: Progressing   Problem: Coping: Goal: Level of anxiety will decrease Outcome: Progressing   Problem: Safety: Goal: Ability to remain free from injury will improve Outcome: Progressing   Problem: Skin Integrity: Goal: Risk for impaired skin integrity will decrease Outcome: Progressing

## 2024-04-24 NOTE — Progress Notes (Signed)
 Rounding Note   Patient Name: Catherine [name] Date of Encounter: 04/24/2024  [facility] Cardiologist: Annabella Scarce, MD   Subjective  Breathing is improved   Still complains of not feeling good since pacer placed  Denies dizziness   Scheduled Meds:  apixaban   2.5 mg Oral BID   bisacodyl   10 mg Rectal Once   diazepam   5 mg Oral QHS   empagliflozin   10 mg Oral Daily   feeding supplement  237 mL Oral BID BM   lactose free nutrition  237 mL Oral BID BM   midodrine   5 mg Oral BID WC   multivitamin with minerals  1 tablet Oral Daily   mupirocin  cream   Topical Daily   polyethylene glycol  17 g Oral QHS   zinc  sulfate (50mg  elemental zinc )  220 mg Oral Daily   Continuous Infusions:  PRN Meds: HYDROcodone -acetaminophen , ipratropium-albuterol , ondansetron  (ZOFRAN ) IV, senna   Vital Signs  Vitals:   04/23/24 1734 04/23/24 2153 04/23/24 2323 04/24/24 0543  BP: 104/61 (!) 106/58 (!) 111/53 (!) 96/51  Pulse: 60 62 (!) 58 66  Resp: 19 18 16 16   Temp: 98.8 F (37.1 C) 98.5 F (36.9 C) 98.5 F (36.9 C) 98.3 F (36.8 C)  TempSrc: Oral Oral Oral Oral  SpO2:  93% 93% 92%  Weight:      Height:        Intake/Output Summary (Last 24 hours) at 04/24/2024 0758 Last data filed at 04/23/2024 2324 Gross per 24 hour  Intake 240 ml  Output --  Net 240 ml   Positive 1.5  L        04/15/2024    3:29 PM 04/13/2024    3:59 PM 04/12/2024   10:13 AM  Last 3 Weights  Weight (lbs) 115 lb 117 lb 120 lb 9.6 oz  Weight (kg) 52.164 kg 53.071 kg 54.704 kg      Telemetry  AV paced - Personally Reviewed  ECG  No new  - Personally Reviewed  Physical Exam  GEN: No acute distress.   Neck: JVP is increased  Cardiac: RRR, II/VI systlic murmur LSB    Respiratory: Clear to auscultation anteriorly  GI: Soft, nontender, non-distended  MS: Tr to 1+ LE edema in ankles   1+ in feet  wrinkles     Skin is very thin/friable.  Labs High Sensitivity Troponin:  No results for input(s):  TROPONINIHS in the last 720 hours.   Chemistry Recent Labs  Lab 04/22/24 0458 04/23/24 0911 04/23/24 1105 04/24/24 0431  NA 136 134* 133* 135  K 3.7 4.4 4.2 4.6  CL 98 94* 97* 98  CO2 27 28 27 29   GLUCOSE 99 151* 128* 96  BUN 37* 43* 42* 43*  CREATININE 1.29* 1.46* 1.38* 1.47*  CALCIUM  9.6 9.8 9.3 9.4  MG 2.2  --   --   --   PROT  --   --  4.9*  --   ALBUMIN   --   --  2.2* 2.0*  AST  --   --  27  --   ALT  --   --  12  --   ALKPHOS  --   --  67  --   BILITOT  --   --  0.4  --   GFRNONAA 40* 35* 37* 35*  ANIONGAP 11 12 9 8     Lipids No results for input(s): CHOL, TRIG, HDL, LABVLDL, LDLCALC, CHOLHDL in the last 168 hours.  Hematology Recent  Labs  Lab 04/18/24 0453 04/19/24 0411  WBC 5.7 6.4  RBC 3.27* 3.35*  HGB 10.0* 10.4*  HCT 29.9* 30.6*  MCV 91.4 91.3  MCH 30.6 31.0  MCHC 33.4 34.0  RDW 13.4 13.5  PLT 113* 125*   Thyroid  No results for input(s): TSH, FREET4 in the last 168 hours.  BNPNo results for input(s): BNP, PROBNP in the last 168 hours.  DDimer No results for input(s): DDIMER in the last 168 hours.   Radiology  DG Chest 1 View Result Date: 04/23/2024 CLINICAL DATA:  Follow-up EXAM: CHEST  1 VIEW COMPARISON:  04/15/2024 FINDINGS: Enlarging right pleural effusion, now moderate. Stable small left pleural effusion. Bilateral lower lobe airspace opacities are noted, right greater than left, worsening bilaterally since prior study. There is cardiomegaly. Pacer remains in place, unchanged. Prior CABG. IMPRESSION: Enlarging moderate right pleural effusion. Stable small left pleural effusion. Worsening bilateral lower lobe airspace disease, right greater than left. Cardiomegaly. Electronically Signed   By: Franky Crease M.D.   On: 04/23/2024 18:51    Cardiac Studies  Echo   Feb 2025  1. Left ventricular ejection fraction, by estimation, is 65 to 70%. The  left ventricle has normal function. The left ventricle has no regional  wall motion  abnormalities. There is moderate left ventricular hypertrophy.  Left ventricular diastolic  parameters are indeterminate.   2. Right ventricular systolic function is mildly reduced. The right  ventricular size is mildly enlarged. There is mildly elevated pulmonary  artery systolic pressure. The estimated right ventricular systolic  pressure is 37.8 mmHg.   3. Left atrial size was severely dilated.   4. Right atrial size was moderately dilated.   5. The mitral valve has been repaired/replaced. Trivial mitral valve  regurgitation. The mean mitral valve gradient is 3.6 mmHg. There is a 29  mm Medtronic Mosaic Porcine bioprosthetic valve present in the mitral  position. Procedure Date: 10/14/2016. Echo   findings are consistent with normal structure and function of the mitral  valve prosthesis.   6. Tricuspid valve regurgitation is mild to moderate.   7. The aortic valve is abnormal. There is moderate calcification of the  aortic valve. Aortic valve regurgitation is not visualized. Mild-Moderate  aortic valve stenosis. Aortic valve area, by VTI measures 0.85 cm. Aortic  valve mean gradient measures  15.3 mmHg. Aortic valve Vmax measures 2.74 m/s. Aortic valve acceleration  time measures 90 msec. DVI 0.48.   8. The inferior vena cava is dilated in size with >50% respiratory  variability, suggesting right atrial pressure of 8 mmHg.   Patient Profile   89 y.o. female   Assessment & Plan   1 CHF   Pt with hx of normal LVEF, mildly reduced RVEF   Presents with volume overload  Pt says it has been developing for at least a couple months    Initially treated with IV lasix    Held  with bump in Cr  Cr is still 1.47 today    HOld today    Note Albumin  2.2 contributing to 3rd spacing of fluid   Review of  echo from Feb 2025  AV is mildly narrowed   There is significant subvalvular turbulance and parasternal views show SAM of MV     Difficult windows   REpeat echo ordered   2  Hx PAF  hx  surgical MAZE 2018  CUrrently on Eliquis  bid  3 Hx SN dysfunction   S/P PPM in Dec 2024    4  Hx SVT  Pt has had bursts of SVT (Junctional tachy)  Note that ablation had been planned for Aug   She has been intolerant to meds in past       5  Hypotension  Pt started on midodrine  several days ago   BP 80s to 110s     6 CAD  s/p CABG 2018 (SVG to RCA)     No symptoms of angina   7  Hx MV dz  (s/p commissurotomy 1970s; s/p MV bioprosthesis 2018)  8  Hx AS  Mild/mod by echo in Feb 2025  Mean gradient 15  9  Hx TR  Reported mild to mod on echo in Feb 2025    For questions or updates, please contact [facility] Please consult [email or web address] for contact info under     Signed, Vina Gull, MD  04/24/2024, 7:58 AM

## 2024-04-24 NOTE — TOC Initial Note (Signed)
 Transition of Care [facility]) - Initial/Assessment Note    Patient Details  Name: Catherine [name] MRN: [id number] Date of Birth: [birth year]  Transition of Care [facility]) CM/SW Contact:    Sudie Erminio Deems, RN Phone Number: 04/24/2024, 12:06 PM  Clinical Narrative:  Risk for readmission assessment completed for this patient. Patient presented for shortness of breath. Patient has DME nebulizer machine in the home. Patient states she has a PCP and gets to appointments without any issues. Patient states she is able to get medications. No home needs identified at this time.              Expected Discharge Plan: Home/Self Care Barriers to Discharge: No Barriers Identified   Patient Goals and CMS Choice Patient states their goals for this hospitalization and ongoing recovery are:: plan to return home once stable.   Choice offered to / list presented to : NA      Expected Discharge Plan and Services In-house Referral: NA   Post Acute Care Choice: NA Living arrangements for the past 2 months: Single Family Home                   DME Agency: NA       HH Arranged: NA          Prior Living Arrangements/Services Living arrangements for the past 2 months: Single Family Home Lives with:: Spouse Patient language and need for interpreter reviewed:: Yes Do you feel safe going back to the place where you live?: Yes      Need for Family Participation in Patient Care: No (Comment) Care giver support system in place?: No (comment)   Criminal Activity/Legal Involvement Pertinent to Current Situation/Hospitalization: No - Comment as needed  Activities of Daily Living   ADL Screening (condition at time of admission) Independently performs ADLs?: Yes (appropriate for developmental age) Is the patient deaf or have difficulty hearing?: No Does the patient have difficulty seeing, even when wearing glasses/contacts?: No Does the patient have difficulty concentrating, remembering, or  making decisions?: No  Permission Sought/Granted Permission sought to share information with : Case Manager, Family Supports                Emotional Assessment Appearance:: Appears stated age Attitude/Demeanor/Rapport: Engaged Affect (typically observed): Appropriate Orientation: : Oriented to Self, Oriented to Place, Oriented to  Time, Oriented to Situation Alcohol / Substance Use: Not Applicable Psych Involvement: No (comment)  Admission diagnosis:  Elevated troponin [R79.89] Acute exacerbation of CHF (congestive heart failure) (HCC) [I50.9] Edema, unspecified type [R60.9] Cellulitis, unspecified cellulitis site [L03.90] Patient Active Problem List   Diagnosis Date Noted   Essential hypertension 04/21/2024   Chronic venous hypertension (idiopathic) with ulcer and inflammation of left lower extremity (HCC) 04/18/2024   Acute on chronic diastolic CHF (congestive heart failure) (HCC) 04/15/2024   CAP (community acquired pneumonia) 03/26/2024   Hyperkalemia 03/26/2024   Peripheral edema 03/26/2024   Acute hypoxemic respiratory failure (HCC) 03/24/2024   Sick sinus syndrome (HCC) 09/21/2023   Sinus pause 09/13/2023   Chronic anticoagulation 09/13/2023   S/P mitral valve replacement with bioprosthetic valve - Oct 14, 2016 09/12/2023   Chronic diastolic CHF (congestive heart failure) (HCC) 09/12/2023   CKD stage 3a, GFR 45-59 ml/min (HCC) 09/12/2023   Hypokalemia 09/12/2023   Pneumothorax on left 09/10/2023   Statin intolerance 09/08/2023   Sciatica 08/29/2023   Chronic low back pain 08/03/2022   AKI (acute kidney injury) (HCC) 06/22/2022   COPD  GOLD 2 03/02/2022  Acute on chronic heart failure with preserved ejection fraction (HFpEF) (HCC) 01/05/2022   Tricuspid regurgitation 07/14/2021   Protein-calorie malnutrition, severe 09/05/2019   Enteritis 09/03/2019   COVID-19 virus infection 08/27/2019   Dysuria 07/07/2019   Chronic anxiety 12/23/2017   Aortic stenosis,  mild 07/26/2017   S/P Maze operation for atrial fibrillation 10/14/2016   S/P CABG x 1 10/14/2016   Coronary artery disease    Chronic insomnia 07/12/2016   DOE (dyspnea on exertion) 03/26/2015   Atrial fibrillation, chronic (HCC) 03/16/2015   Valvular heart disease 03/16/2015   Nephrolithiasis 03/16/2015   Primary hypertension    Hyperlipidemia LDL goal <70 04/28/2011   Rheumatic heart disease 01/05/2008   PCP:  Micheal Wolm ORN, MD Pharmacy:   [facility] [place], [place] - [address] [place] [address] Phone: [phone] Fax: [phone]  [facility] [address] [place] [address] Phone: [phone] Fax: [phone]  [facility] [place], [place] - [address] [place] [address] Phone: [phone] Fax: [phone]  [facility] [place], [place] - [address] [place] [address] Phone: [phone] Fax: [phone]  [facility] [address] [place] [address] Phone: [phone] Fax: [phone]     Social Drivers of Health (SDOH) Social History: SDOH Screenings   Food Insecurity: No Food Insecurity (04/16/2024)  Housing: Low Risk  (04/16/2024)  Transportation Needs: No Transportation Needs (04/16/2024)  Utilities: Not At Risk (04/16/2024)  Alcohol Screen: Low Risk  (11/06/2021)  Depression (PHQ2-9): Low Risk  (04/13/2024)  Recent Concern: Depression (PHQ2-9) - Medium Risk (03/28/2024)  Financial Resource Strain: Low Risk  (04/09/2022)  Physical Activity: Insufficiently Active (04/09/2022)  Social Connections: Socially Integrated (04/16/2024)  Stress: No Stress Concern Present (04/09/2022)  Tobacco Use: Medium Risk (04/15/2024)   SDOH Interventions:     Readmission Risk Interventions    04/24/2024   12:04 PM 09/13/2023   12:13 PM  Readmission Risk  Prevention Plan  Transportation Screening Complete Complete  PCP or Specialist Appt within 3-5 Days Complete   HRI or Home Care Consult Complete Complete  Social Work Consult for Recovery Care Planning/Counseling Complete   Palliative Care Screening Not Applicable Not Applicable  Medication Review Oceanographer) Referral to Pharmacy Complete

## 2024-04-24 NOTE — Progress Notes (Signed)
 Mobility Specialist Progress Note;   04/24/24 1058  Mobility  Activity Ambulated with assistance  Level of Assistance Standby assist, set-up cues, supervision of patient - no hands on  Assistive Device None  Distance Ambulated (ft) 200 ft  Activity Response Tolerated well  Mobility Referral Yes  Mobility visit 1 Mobility  Mobility Specialist Start Time (ACUTE ONLY) 1058  Mobility Specialist Stop Time (ACUTE ONLY) 1105  Mobility Specialist Time Calculation (min) (ACUTE ONLY) 7 min   Pt agreeable to mobility. Required no physical assistance during ambulation, SV. Took 1x standing rest break to catch breath. VSS on RA. Pt returned back to chair and left with all needs met, call bell in reach.   Lauraine Erm Mobility Specialist Please contact via SecureChat or [employer] [phone]

## 2024-04-24 NOTE — Progress Notes (Incomplete)
 Progress Note   Patient: Catherine [name] FMW:[id number] DOB: September 18, 1935 DOA: 04/15/2024     8 DOS: the patient was seen and examined on 04/24/2024   Brief hospital course: Mrs. Sarsfield was admitted to the hospital with the working diagnosis of heart failure exacerbation.   88/F with history of SVT, junctional tachycardia, paroxysmal A-fib bioprosthetic MVR, CABG, diastolic CHF, COPD, chronic back pain, and debility presented to the ED with cough, worsening dyspnea and edema. Reported 24 hrs of worsening symptoms despite increasing dose of bumetanide  at home.  On her initial physical examination her blood pressure was 113/51, HR 59, RR 15 and 02 saturation 93% Lungs with bilateral rales with no wheezing, heart with S1 and S2 present and regular, abdomen with no distention and positive +++ bilateral lower extremity edema.   Na 137, K 2,8 Cl 97 bicarbonate 26 glucose 134 bun 29 cr 1,32  BNP 14,671  High sensitive troponin 135 and 129  Lactic acid 1,5 and 2,0  Wbc 9,5 hgb 13.2 plt 146  Urine analysis SG 1,013, protein > 300, negative leukocytes and negative Hgb   Chest radiograph with hyperinflation, no cardiomegaly, prominent pulmonary vasculature, positive small right pleural effusion, no infiltrates.  EKG 76 bpm, normal axis, normal intervals, qtc 441, atrial fibrillation rhythm with ventricular pacing, no ST segment changes, negative T waves lead I, aVL, V4 to V6.   Patient was placed on furosemide  for diuresis, limited therapy due to hypotension.  Positive proteinuria nephrotic range.   Assessment and Plan: * Acute on chronic diastolic CHF (congestive heart failure) (HCC) Echocardiogram with preserved LV systolic function with EF 65 to 70%, moderate LVH, RV with mild reduction in systolic function, RVSP 37,8 mmHg, LA with severe dilatation, RA with moderate dilatation, mitral valve replaced with normal function, mild to moderate TR, mild to moderate AS,   Urine output is documented at  400 ml  Systolic blood pressure 100 mmHg range.   Improved volume status, continue SGLT 2 inh Patient intolerant to spironolactone   Hold on losartan  due to risk of hypotension Continue midodrine  for blood pressure support.  Continue to hold on loop diuretic for now.   Essential hypertension Continue midodrine  for blood pressure support.   Atrial fibrillation, chronic (HCC) Anticoagulation with reduced dose apixaban .  Rhythm atrial fibrillation with ventricular pacing, intermittent atrial pacing.  Possible re trail of bisoprolol  during this hospitalization   CKD stage 3a, GFR 45-59 ml/min (HCC) Nephrotic syndrome. Hyponatremia. AKI  Urine protein creatinine ration of 11,4 consistent with nephrotic range proteinuria.   Follow up renal function with serum cr at 1,46 with K at 4,4 and serum bicarbonate at 28  Na 134   Will do Spep, Upep and free light chain, if negative may need renal biopsy, discussed with nephrology.   COPD  GOLD 2 No signs of acute exacerbation  Protein-calorie malnutrition, severe Consult nutrition for evaluation   Chronic anxiety Resume diazepam .       {Tip this will not be part of the note when signed Body mass index is 20.37 kg/m. ,  Nutrition Documentation    Flowsheet Row ED to Hosp-Admission (Current) from 04/15/2024 in [facility]  Nutrition Problem Severe Malnutrition  Etiology nausea, poor appetite, chronic illness, wound healing  Nutrition Goal Patient will meet greater than or equal to 90% of their needs  Interventions Refer to RD note for recommendations  ,  Active Pressure Injury/Wound(s)     None          (  Optional):26781}  Subjective: ***  Physical Exam: Vitals:   04/23/24 2323 04/24/24 0543 04/24/24 0819 04/24/24 1218  BP: (!) 111/53 (!) 96/51 101/63 (!) 103/55  Pulse: (!) 58 66 61 62  Resp: 16 16 16 16   Temp: 98.5 F (36.9 C) 98.3 F (36.8 C) 98.7 F (37.1 C) 98.3 F (36.8 C)  TempSrc: Oral  Oral Oral Oral  SpO2: 93% 92% 92% 94%  Weight:      Height:       *** Data Reviewed: {Tip this will not be part of the note when signed- Document your independent interpretation of telemetry tracing, EKG, lab, Radiology test or any other diagnostic tests. Add any new diagnostic test ordered today. (Optional):26781} {Results:26384}  Family Communication: ***  Disposition: Status is: Inpatient {Inpatient:23812}  Planned Discharge Destination: {DISCHARGE DESTINATION_TRH:27031} {Tip this will not be part of the note when signed  DVT Prophylaxis  .Apixaban  (eliquis ) tablet 2.5 mg , Apixaban  (eliquis ) tablet 2.5 mg  (Optional):26781}   Time spent: *** minutes  Author: Elidia Toribio Furnace, MD 04/24/2024 12:48 PM  For on call review [email or web address].

## 2024-04-25 ENCOUNTER — Encounter: Payer: Self-pay | Admitting: Cardiovascular Disease

## 2024-04-25 ENCOUNTER — Other Ambulatory Visit (HOSPITAL_COMMUNITY): Payer: Self-pay

## 2024-04-25 LAB — PROTEIN ELECTROPHORESIS, SERUM
A/G Ratio: 1.1 (ref 0.7–1.7)
Albumin ELP: 2.4 g/dL — ABNORMAL LOW (ref 2.9–4.4)
Alpha-1-Globulin: 0.2 g/dL (ref 0.0–0.4)
Alpha-2-Globulin: 0.8 g/dL (ref 0.4–1.0)
Beta Globulin: 0.8 g/dL (ref 0.7–1.3)
Gamma Globulin: 0.3 g/dL — ABNORMAL LOW (ref 0.4–1.8)
Globulin, Total: 2.2 g/dL (ref 2.2–3.9)
M-Spike, %: 0.1 g/dL — ABNORMAL HIGH
Total Protein ELP: 4.6 g/dL — ABNORMAL LOW (ref 6.0–8.5)

## 2024-04-25 LAB — RENAL FUNCTION PANEL
Albumin: 2 g/dL — ABNORMAL LOW (ref 3.5–5.0)
Anion gap: 6 (ref 5–15)
BUN: 42 mg/dL — ABNORMAL HIGH (ref 8–23)
CO2: 28 mmol/L (ref 22–32)
Calcium: 9.6 mg/dL (ref 8.9–10.3)
Chloride: 98 mmol/L (ref 98–111)
Creatinine, Ser: 1.22 mg/dL — ABNORMAL HIGH (ref 0.44–1.00)
GFR, Estimated: 43 mL/min — ABNORMAL LOW (ref >60)
Glucose, Bld: 100 mg/dL — ABNORMAL HIGH (ref 70–99)
Phosphorus: 3.5 mg/dL (ref 2.5–4.6)
Potassium: 4.8 mmol/L (ref 3.5–5.1)
Sodium: 132 mmol/L — ABNORMAL LOW (ref 135–145)

## 2024-04-25 MED ORDER — ENSURE PLUS HIGH PROTEIN PO LIQD
237.0000 mL | Freq: Two times a day (BID) | ORAL | 0 refills | Status: AC
Start: 2024-04-25 — End: ?

## 2024-04-25 MED ORDER — GUAIFENESIN ER 600 MG PO TB12
600.0000 mg | ORAL_TABLET | Freq: Two times a day (BID) | ORAL | Status: DC | PRN
  Administered 2024-04-25: 600 mg via ORAL
  Filled 2024-04-25: qty 1

## 2024-04-25 MED ORDER — ONDANSETRON HCL 4 MG PO TABS
4.0000 mg | ORAL_TABLET | Freq: Three times a day (TID) | ORAL | 0 refills | Status: DC | PRN
  Filled 2024-04-25: qty 20, 7d supply, fill #0

## 2024-04-25 MED ORDER — GUAIFENESIN ER 600 MG PO TB12
600.0000 mg | ORAL_TABLET | Freq: Two times a day (BID) | ORAL | 0 refills | Status: DC | PRN
  Filled 2024-04-25: qty 40, 20d supply, fill #0

## 2024-04-25 MED ORDER — BUMETANIDE 1 MG PO TABS: 1.0000 mg | ORAL_TABLET | Freq: Every day | ORAL | Status: DC | PRN

## 2024-04-25 MED ORDER — EMPAGLIFLOZIN 10 MG PO TABS
10.0000 mg | ORAL_TABLET | Freq: Every day | ORAL | 0 refills | Status: DC
  Filled 2024-04-25: qty 30, 30d supply, fill #0

## 2024-04-25 MED ORDER — ADULT MULTIVITAMIN W/MINERALS CH
1.0000 | ORAL_TABLET | Freq: Every day | ORAL | 0 refills | Status: DC
  Filled 2024-04-25: qty 30, 30d supply, fill #0

## 2024-04-25 MED ORDER — MIDODRINE HCL 5 MG PO TABS
5.0000 mg | ORAL_TABLET | Freq: Two times a day (BID) | ORAL | 0 refills | Status: DC
  Filled 2024-04-25: qty 60, 30d supply, fill #0

## 2024-04-25 MED ORDER — GUAIFENESIN 100 MG/5ML PO LIQD: 5.0000 mL | ORAL | Status: DC | PRN

## 2024-04-25 MED ORDER — BUMETANIDE 1 MG PO TABS
1.0000 mg | ORAL_TABLET | Freq: Every day | ORAL | 0 refills | Status: DC | PRN
  Filled 2024-04-25: qty 30, 30d supply, fill #0

## 2024-04-25 MED ORDER — ONDANSETRON HCL 4 MG PO TABS: 4.0000 mg | ORAL_TABLET | Freq: Three times a day (TID) | ORAL | Status: DC | PRN

## 2024-04-25 NOTE — Plan of Care (Signed)
  Problem: Malnutrition  (NI-5.2) Goal: Food and/or nutrient delivery Description: Individualized approach for food/nutrient provision. Outcome: Adequate for Discharge   

## 2024-04-25 NOTE — Discharge Summary (Addendum)
 Physician Discharge Summary   Patient: Catherine [name] MRN: [id number] DOB: [birth year]  Admit date:     04/15/2024  Discharge date: 04/25/24  Discharge Physician: Elidia Sieving Natalina Wieting   PCP: Micheal Wolm ORN, MD   Recommendations at discharge:    Patient has been placed on midodrine  for blood pressure support.  Changed diuretic therapy to SGLT 2 inh daily and as needed bumetanide  for signs of volume overload, weight gain 2 to 3 lbs in 24 hrs or 5 in 7 days. Patient will have refractive edema due to nephrotic syndrome.  Follow up on serum protein electrophoresis.  Follow up renal function and electrolytes in 7 days as outpatient  Follow up with Dr Micheal in 7 to 10 days Follow up with Cardiology as scheduled.   I spoke with patient's daughter over the phone, we talked in detail about patient's condition, plan of care and prognosis and all questions were addressed.   Discharge Diagnoses: Principal Problem:   Acute on chronic diastolic CHF (congestive heart failure) (HCC) Active Problems:   Essential hypertension   Atrial fibrillation, chronic (HCC)   CKD stage 3a, GFR 45-59 ml/min (HCC)   COPD  GOLD 2   Protein-calorie malnutrition, severe   Chronic anxiety  Resolved Problems:   * No resolved hospital problems. [place] [facility] Course: Mrs. Schaafsma was admitted to the hospital with the working diagnosis of heart failure exacerbation.   89/F with history of SVT, junctional tachycardia, paroxysmal A-fib bioprosthetic MVR, CABG, diastolic CHF, COPD, chronic back pain, and debility presented to the ED with cough, worsening dyspnea and edema. Reported 24 hrs of worsening symptoms despite increasing dose of bumetanide  at home.  On her initial physical examination her blood pressure was 113/51, HR 59, RR 15 and 02 saturation 93% Lungs with bilateral rales with no wheezing, heart with S1 and S2 present and regular, abdomen with no distention and positive +++ bilateral lower extremity  edema.   Na 137, K 2,8 Cl 97 bicarbonate 26 glucose 134 bun 29 cr 1,32  BNP 14,671  High sensitive troponin 135 and 129  Lactic acid 1,5 and 2,0  Wbc 9,5 hgb 13.2 plt 146  Urine analysis SG 1,013, protein > 300, negative leukocytes and negative Hgb   Chest radiograph with hyperinflation, no cardiomegaly, prominent pulmonary vasculature, positive small right pleural effusion, no infiltrates.  EKG 76 bpm, normal axis, normal intervals, qtc 441, atrial fibrillation rhythm with ventricular pacing, no ST segment changes, negative T waves lead I, aVL, V4 to V6.   Patient was placed on furosemide  for diuresis, limited therapy due to hypotension.  Positive proteinuria nephrotic range.   07/29 thoracentesis right 1.1 L removed.  07/30 improved volume status, patient will follow up as outpatient and continue bumetanide  as needed as outpatient. Use compression socks for peripheral edema.   Assessment and Plan: * Acute on chronic diastolic CHF (congestive heart failure) (HCC) Echocardiogram with no resting LVOT gradient, LV systolic function preserved EF 70 to 75%, RV with mild reduction in systolic function, RVSP 50.5 mmHg, LA with severe dilatation, RA with mild dilatation, moderate TR, mitral valve has been replaced, mild to moderate aortic stenosis,   Patient was placed on furosemide  IV, clinically  negative fluid balance was achieved with improvement in her symptoms.   Acute cardiogenic pulmonary edema with right pleural effusion.  07/29 right US  guided thoracentesis, removed 1.1 L of clear yellow fluid, transudate per Light's criteria   Continue SGLT 2 inh and as needed bumetanide   Patient intolerant to spironolactone   Hold on losartan  due to risk of hypotension Continue midodrine  for blood pressure support.   Essential hypertension Continue midodrine  for blood pressure support.   Atrial fibrillation, chronic (HCC) PS MAZE 2018  Sinus node dysfunction sp pacemaker 08/2023    Anticoagulation with reduced dose apixaban .  Rhythm atrial fibrillation with ventricular pacing, intermittent atrial pacing.  Burst of SVT, junctional tachycardia, plan for ablation, follow up with EP as outpatient.    CKD stage 3a, GFR 45-59 ml/min (HCC) Nephrotic syndrome. Hyponatremia. AKI  Urine protein creatinine ration of 11,4 consistent with nephrotic range proteinuria.   Positive hyperlipidemia and hypoalbuminemia.  Free kappa/ lambda ration is very elevated at 55.35, pending electrophoresis   Renal function today with serum cr at 1,22 with K at 4,8 and serum bicarbonate at 28  Na 132 and P 3.5   Continue loop diuretic and SGLT 2 inh Follow up as outpatient.  Patient will have refractive edema due to hypoproteinemia  COPD  GOLD 2 No signs of acute exacerbation  Protein-calorie malnutrition, severe Nutritional supplements.   Chronic anxiety Continue with diazepam .        Consultants: cardiology  Procedures performed: right thoracentesis   Disposition: Home Diet recommendation:  Cardiac diet DISCHARGE MEDICATION: Allergies as of 04/25/2024       Reactions   Aldactone  [spironolactone ] Other (See Comments)   Dyspnea    Amoxil  [amoxicillin ] Palpitations, Other (See Comments)   Tachycardia   Zebeta  [bisoprolol ] Shortness Of Breath   Calan  [verapamil ] Other (See Comments)   Myalgias    Crestor  [rosuvastatin ] Other (See Comments)   Myalgias    Flovent  [fluticasone  Propionate] Other (See Comments)   Leg cramps   Livalo  [pitavastatin ] Other (See Comments)   Myalgias    Lyrica [pregabalin] Swelling   Neurontin [gabapentin] Swelling   Norvasc  [amlodipine ] Swelling   Low extremity edema   Zetia  [ezetimibe ] Other (See Comments)   Myalgias   Zocor  [simvastatin ] Other (See Comments)   Myalgias    Lotensin [benazepril] Cough   Polytrim  [polymyxin B -trimethoprim ] Swelling        Medication List     STOP taking these medications    metolazone  5 MG  tablet Commonly known as: ZAROXOLYN    potassium chloride  10 MEQ CR capsule Commonly known as: MICRO-K    sacubitril -valsartan  24-26 MG Commonly known as: Entresto        TAKE these medications    acetaminophen  500 MG tablet Commonly known as: TYLENOL  Take 500 mg by mouth every 6 (six) hours as needed for mild pain (pain score 1-3).   albuterol  (2.5 MG/3ML) 0.083% nebulizer solution Commonly known as: PROVENTIL  Take 3 mLs (2.5 mg total) by nebulization every 6 (six) hours as needed (during COPD Exacerbation).   albuterol  108 (90 Base) MCG/ACT inhaler Commonly known as: VENTOLIN  HFA INHALE 2 PUFFS INTO THE LUNGS EVERY 4 HOURS AS NEEDED FOR AHEEZING OR SHORTNESS OF BREATH   bumetanide  1 MG tablet Commonly known as: BUMEX  Take 1 tablet (1 mg total) by mouth daily as needed (for worsening lower extremity swelling, weight gain 2 to 3 lbs in 24 hrs or 5 lbs in 7 days). What changed:  medication strength how much to take when to take this reasons to take this   CertaVite/Antioxidants Tabs Take 1 tablet by mouth daily. Start taking on: April 26, 2024   cholecalciferol 25 MCG (1000 UNIT) tablet Commonly known as: VITAMIN D3 Take 1,000 Units by mouth daily.   diazepam  5 MG tablet Commonly  known as: VALIUM  TAKE ONE TABLET BY MOUTH AT BEDTIME   Eliquis  2.5 MG Tabs tablet Generic drug: apixaban  TAKE ONE TABLET TWICE DAILY (START 09/18/23 OR AS DIRECTED)   estradiol  0.1 MG/GM vaginal cream Commonly known as: ESTRACE  Place 1 Applicatorful vaginally See admin instructions. Apply a small amount vaginally three nights a week on Monday, Wednesday and Friday.   feeding supplement Liqd Take 237 mLs by mouth 2 (two) times daily between meals.   fluticasone  50 MCG/ACT nasal spray Commonly known as: FLONASE  Place 1 spray into both nostrils daily as needed for allergies or rhinitis.   guaiFENesin  600 MG 12 hr tablet Commonly known as: MUCINEX  Take 1 tablet (600 mg total) by mouth 2  (two) times daily as needed for to loosen phlegm or cough.   HYDROcodone -acetaminophen  5-325 MG tablet Commonly known as: NORCO/VICODIN TAKE ONE TABLET EVERY 8 HOURS AS NEEDED FOR PAIN   Jardiance  10 MG Tabs tablet Generic drug: empagliflozin  Take 1 tablet (10 mg total) by mouth daily. Start taking on: April 26, 2024   midodrine  5 MG tablet Commonly known as: PROAMATINE  Take 1 tablet (5 mg total) by mouth 2 (two) times daily with a meal.   mupirocin  ointment 2 % Commonly known as: BACTROBAN  APPLY TOPICALLY ONCE A DAY   ondansetron  4 MG tablet Commonly known as: ZOFRAN  Take 1 tablet (4 mg total) by mouth every 8 (eight) hours as needed for nausea or vomiting.               Discharge Care Instructions  (From admission, onward)           Start     Ordered   04/25/24 0000  Discharge wound care:       Comments: Apply Bactroban  to left leg wound Q day, then cover with foam dressing.  Change foam dressing Q 3 days or PRN soiling   04/25/24 1324            Follow-up Information     Harden Jerona GAILS, MD Follow up in 1 week(s).   Specialty: Orthopedic Surgery Contact information: [address]  [place] [address] [phone]                Discharge Exam: Filed Weights   04/15/24 1529 04/25/24 0555  Weight: 52.2 kg 55.1 kg   BP (!) 108/52 (BP Location: Left Arm)   Pulse 66   Temp 98.4 F (36.9 C) (Oral)   Resp 18   Ht 5' 3 (1.6 m)   Wt 55.1 kg   SpO2 92%   BMI 21.52 kg/m   Patient is feeling better, her dyspnea has improved along with her peripheral edema, no chest pain.   Neurology awake and alert ENT With mild pallor with no icterus Cardiovascular with S1 and S2 present and regular with positive systolic murmur at right lower sternal border No JVD Respiratory with mild decreased breath sounds at bases with mild rales with no wheezing or rhonchi  Abdomen with no distention, non tender Lower extremity edema with + pitting edema.    Condition at discharge: stable  The results of significant diagnostics from this hospitalization (including imaging, microbiology, ancillary and laboratory) are listed below for reference.   Imaging Studies: ECHOCARDIOGRAM COMPLETE Result Date: 04/24/2024    ECHOCARDIOGRAM REPORT   Patient Name:   MARRAH VANEVERY Batty Date of Exam: 04/24/2024 Medical Rec #:  [id number]      Height:       63.0 in Accession #:  [id number]     Weight:       115.0 lb Date of Birth:  November 05, 1934     BSA:          1.528 m Patient Age:    88 years       BP:           139/64 mmHg Patient Gender: F              HR:           61 bpm. Exam Location:  Inpatient Procedure: 2D Echo, Cardiac Doppler and Color Doppler (Both Spectral and Color            Flow Doppler were utilized during procedure). Indications:    CHF-Acute Diastolic I50.31  History:        Patient has prior history of Echocardiogram examinations, most                 recent 11/02/2023. CHF, CAD, Prior CABG, COPD and CKD, stage 3,                 Arrythmias:Atrial Fibrillation, Signs/Symptoms:Dyspnea; Risk                 Factors:Hypertension and Dyslipidemia.                  Mitral Valve: 29 mm Medtronic bioprosthetic valve valve is                 present in the mitral position. Procedure Date: 10/14/2016.  Sonographer:    Thea Norlander RCS Referring Phys: [id number] PAULA V ROSS IMPRESSIONS  1. No resting LVOT gradient. Left ventricular ejection fraction, by estimation, is 70 to 75%. The left ventricle has hyperdynamic function. The left ventricle has no regional wall motion abnormalities. Left ventricular diastolic parameters are indeterminate.  2. Right ventricular systolic function is mildly reduced. The right ventricular size is normal. There is moderately elevated pulmonary artery systolic pressure. The estimated right ventricular systolic pressure is 50.5 mmHg.  3. Left atrial size was severely dilated.  4. Right atrial size was mildly dilated.  5. Small, echodensity noted  on the chordal apparatus unchanged from 2023 PLAX view. There new LVOT is small related to valve struts with LVOT flow acceleration. The mitral valve has been repaired/replaced. No evidence of mitral valve regurgitation. The mean mitral valve gradient is 8.0 mmHg. There is a 29 mm Medtronic bioprosthetic valve present in the mitral position. Procedure Date: 10/14/2016.  6. Tricuspid valve regurgitation is moderate.  7. The aortic valve is tricuspid. Aortic valve regurgitation is not visualized. Mild to moderate aortic valve stenosis. Aortic valve area, by VTI measures 1.04 cm. Aortic valve mean gradient measures 19.0 mmHg. Aortic valve Vmax measures 3.02 m/s.  8. The inferior vena cava is dilated in size with <50% respiratory variability, suggesting right atrial pressure of 15 mmHg. Comparison(s): Prior images reviewed side by side. Mitral gradients have increased from 2023 with similar heart rates (60s); aortic valve gradients have increased; a portion of this is related to dynamic LV function. FINDINGS  Left Ventricle: No resting LVOT gradient. Left ventricular ejection fraction, by estimation, is 70 to 75%. The left ventricle has hyperdynamic function. The left ventricle has no regional wall motion abnormalities. The left ventricular internal cavity size was normal in size. There is no left ventricular hypertrophy. Left ventricular diastolic parameters are indeterminate. Right Ventricle: The right ventricular size is normal. No increase in right ventricular wall thickness. Right ventricular  systolic function is mildly reduced. There is moderately elevated pulmonary artery systolic pressure. The tricuspid regurgitant velocity is 2.98 m/s, and with an assumed right atrial pressure of 15 mmHg, the estimated right ventricular systolic pressure is 50.5 mmHg. Left Atrium: Left atrial size was severely dilated. Right Atrium: Right atrial size was mildly dilated. Pericardium: There is no evidence of pericardial effusion.  Mitral Valve: Small, echodensity noted on the chordal apparatus unchanged from 2023 PLAX view. There new LVOT is small related to valve struts with LVOT flow acceleration. The mitral valve has been repaired/replaced. No evidence of mitral valve regurgitation. There is a 29 mm Medtronic bioprosthetic valve present in the mitral position. Procedure Date: 10/14/2016. MV peak gradient, 23.7 mmHg. The mean mitral valve gradient is 8.0 mmHg with average heart rate of 60 bpm. Tricuspid Valve: The tricuspid valve is normal in structure. Tricuspid valve regurgitation is moderate . No evidence of tricuspid stenosis. Aortic Valve: The aortic valve is tricuspid. Aortic valve regurgitation is not visualized. Mild to moderate aortic stenosis is present. Aortic valve mean gradient measures 19.0 mmHg. Aortic valve peak gradient measures 36.5 mmHg. Aortic valve area, by VTI measures 1.04 cm. Pulmonic Valve: The pulmonic valve was normal in structure. Pulmonic valve regurgitation is trivial. No evidence of pulmonic stenosis. Aorta: The aortic root and ascending aorta are structurally normal, with no evidence of dilitation. Venous: The inferior vena cava is dilated in size with less than 50% respiratory variability, suggesting right atrial pressure of 15 mmHg. IAS/Shunts: The atrial septum is grossly normal.  LEFT VENTRICLE PLAX 2D LVIDd:         4.57 cm   Diastology LVIDs:         3.08 cm   LV e' medial:    6.16 cm/s LV PW:         0.64 cm   LV E/e' medial:  32.3 LV IVS:        0.93 cm   LV e' lateral:   8.92 cm/s LVOT diam:     1.60 cm   LV E/e' lateral: 22.3 LV SV:         63 LV SV Index:   41 LVOT Area:     2.01 cm  RIGHT VENTRICLE            IVC RV S prime:     7.22 cm/s  IVC diam: 2.27 cm TAPSE (M-mode): 1.6 cm LEFT ATRIUM              Index        RIGHT ATRIUM           Index LA diam:        5.80 cm  3.79 cm/m   RA Area:     21.15 cm LA Vol (A2C):   97.8 ml  63.99 ml/m  RA Volume:   59.55 ml  38.96 ml/m LA Vol (A4C):    119.0 ml 77.86 ml/m LA Biplane Vol: 108.0 ml 70.66 ml/m  AORTIC VALVE AV Area (Vmax):    1.09 cm AV Area (Vmean):   1.05 cm AV Area (VTI):     1.04 cm AV Vmax:           302.00 cm/s AV Vmean:          198.000 cm/s AV VTI:            0.604 m AV Peak Grad:      36.5 mmHg AV Mean Grad:      19.0  mmHg LVOT Vmax:         164.00 cm/s LVOT Vmean:        103.000 cm/s LVOT VTI:          0.313 m LVOT/AV VTI ratio: 0.52  AORTA Ao Root diam: 3.15 cm Ao Asc diam:  3.50 cm MITRAL VALVE                TRICUSPID VALVE MV Area (PHT): 2.34 cm     TR Peak grad:   35.5 mmHg MV Area VTI:   0.94 cm     TR Vmax:        298.00 cm/s MV Peak grad:  23.7 mmHg MV Mean grad:  8.0 mmHg     SHUNTS MV Vmax:       2.44 m/s     Systemic VTI:  0.31 m MV Vmean:      110.0 cm/s   Systemic Diam: 1.60 cm MV Decel Time: 324 msec MV E velocity: 199.00 cm/s Stanly Leavens MD Electronically signed by Stanly Leavens MD Signature Date/Time: 04/24/2024/5:19:23 PM    Final    IR THORACENTESIS ASP PLEURAL SPACE W/IMG GUIDE Result Date: 04/24/2024 INDICATION: In the hospital with CHF exacerbation. Right pleural effusion. IR consulted for diagnostic and therapeutic right thoracentesis. EXAM: ULTRASOUND GUIDED DIAGNOSTIC AND THERAPEUTIC RIGHT THORACENTESIS MEDICATIONS: 6 mL 1% lidocaine  COMPLICATIONS: None immediate. PROCEDURE: An ultrasound guided thoracentesis was thoroughly discussed with the patient and questions answered. The benefits, risks, alternatives and complications were also discussed. The patient understands and wishes to proceed with the procedure. Written consent was obtained. Ultrasound was performed to localize and mark an adequate pocket of fluid in the right chest. The area was then prepped and draped in the normal sterile fashion. 1% Lidocaine  was used for local anesthesia. Under ultrasound guidance a 6 Fr Safe-T-Centesis catheter was introduced. Thoracentesis was performed. The catheter was removed and a dressing applied.  FINDINGS: A total of approximately 1.1 liters of clear yellow fluid was removed. Samples were sent to the laboratory as requested by the clinical team. IMPRESSION: Successful ultrasound guided right thoracentesis yielding 1.1 liters of pleural fluid. Performed and dictated by Kimble Clas, PA-C Electronically Signed   By: CHRISTELLA.  Shick M.D.   On: 04/24/2024 14:42   DG Chest Port 1 View Result Date: 04/24/2024 CLINICAL DATA:  Status post thoracentesis. EXAM: PORTABLE CHEST 1 VIEW COMPARISON:  Chest radiograph dated 04/24/2019 side. FINDINGS: Interval decrease in the size of right pleural effusion with improved aeration of the right lung base. No pneumothorax. Small left pleural effusion as seen on the prior radiograph. There is cardiomegaly with vascular congestion. Median sternotomy wires and left pectoral pacemaker device. No acute osseous pathology. IMPRESSION: Interval decrease in the size of right pleural effusion with improved aeration of the right lung base. No pneumothorax. Electronically Signed   By: Vanetta Chou M.D.   On: 04/24/2024 14:30   DG Chest 1 View Result Date: 04/23/2024 CLINICAL DATA:  Follow-up EXAM: CHEST  1 VIEW COMPARISON:  04/15/2024 FINDINGS: Enlarging right pleural effusion, now moderate. Stable small left pleural effusion. Bilateral lower lobe airspace opacities are noted, right greater than left, worsening bilaterally since prior study. There is cardiomegaly. Pacer remains in place, unchanged. Prior CABG. IMPRESSION: Enlarging moderate right pleural effusion. Stable small left pleural effusion. Worsening bilateral lower lobe airspace disease, right greater than left. Cardiomegaly. Electronically Signed   By: Franky Crease M.D.   On: 04/23/2024 18:51   DG Chest 2 View Result  Date: 04/15/2024 CLINICAL DATA:  Short of breath. EXAM: CHEST - 2 VIEW COMPARISON:  03/16/2024.  CT, 09/10/2023. FINDINGS: Stable changes from previous cardiac surgery. Cardiac silhouette normal in size. Left  anterior chest wall dual lead pacemaker is stable and well positioned. No mediastinal or hilar masses. No evidence of adenopathy. Small right pleural effusion. Additional opacity at both lung bases consistent with atelectasis. Chronic bilateral interstitial thickening. No overt pulmonary edema. No pneumothorax. Mild depression of the upper endplate of a lower thoracic vertebra, stable from the prior chest CT. No acute fracture. IMPRESSION: 1. Lung base opacities suspected to be atelectasis, associated with a small right pleural effusion. 2. No convincing pneumonia or pulmonary edema. Electronically Signed   By: Alm Parkins M.D.   On: 04/15/2024 16:41   CUP PACEART INCLINIC DEVICE CHECK Result Date: 04/12/2024 in-clinic  _dual__ chamber pacemaker check. Presenting Rhythm: _VS/tachy c/w SVT__ . auto thresholds, sensing, and impedance demonstrate stable parameters and no programming changes needed at this time.. Estimated longevity _9.6 years___ . Pt enrolled in  remote follow-up. in office with Dr. Waddell support in/out of felt to be junctional tachycardia. full discussion in office note   Microbiology: Results for orders placed or performed during the hospital encounter of 04/15/24  Culture, blood (Routine X 2) w Reflex to ID Panel     Status: None   Collection Time: 04/16/24  9:55 AM   Specimen: BLOOD RIGHT ARM  Result Value Ref Range Status   Specimen Description BLOOD RIGHT ARM  Final   Special Requests   Final    BOTTLES DRAWN AEROBIC ONLY Blood Culture results may not be optimal due to an inadequate volume of blood received in culture bottles   Culture   Final    NO GROWTH 7 DAYS Performed at [facility], [address] [place], [place] [address]    Report Status 04/23/2024 FINAL  Final  Culture, blood (Routine X 2) w Reflex to ID Panel     Status: None   Collection Time: 04/16/24  9:55 AM   Specimen: BLOOD LEFT HAND  Result Value Ref Range Status   Specimen Description BLOOD  LEFT HAND  Final   Special Requests   Final    BOTTLES DRAWN AEROBIC ONLY Blood Culture results may not be optimal due to an inadequate volume of blood received in culture bottles   Culture   Final    NO GROWTH 7 DAYS Performed at [facility], [address] [place], [place] [address]    Report Status 04/23/2024 FINAL  Final  Gram stain     Status: None   Collection Time: 04/24/24  2:04 PM   Specimen: Lung, Right; Pleural Fluid  Result Value Ref Range Status   Specimen Description FLUID PLEURAL RIGHT  Final   Special Requests NONE  Final   Gram Stain   Final    WBC PRESENT,BOTH PMN AND MONONUCLEAR NO ORGANISMS SEEN CYTOSPIN SMEAR Performed at [facility], [address] [place], [place] [address]    Report Status 04/24/2024 FINAL  Final  Culture, body fluid w Gram Stain-bottle     Status: None (Preliminary result)   Collection Time: 04/24/24  2:04 PM   Specimen: Fluid  Result Value Ref Range Status   Specimen Description FLUID PLEURAL RIGHT  Final   Special Requests NONE  Final   Culture   Final    NO GROWTH < 24 HOURS Performed at [facility], [address] [place], [place] [address]    Report Status PENDING  Incomplete   *Note: Due to a large number of results and/or encounters for the requested time period, some results have not been displayed. A complete set of results can be found in Results Review.    Labs: CBC: Recent Labs  Lab 04/19/24 0411  WBC 6.4  HGB 10.4*  HCT 30.6*  MCV 91.3  PLT 125*   Basic Metabolic Panel: Recent Labs  Lab 04/22/24 0458 04/23/24 0911 04/23/24 1105 04/24/24 0431 04/25/24 0452  NA 136 134* 133* 135 132*  K 3.7 4.4 4.2 4.6 4.8  CL 98 94* 97* 98 98  CO2 27 28 27 29 28   GLUCOSE 99 151* 128* 96 100*  BUN 37* 43* 42* 43* 42*  CREATININE 1.29* 1.46* 1.38* 1.47* 1.22*  CALCIUM  9.6 9.8 9.3 9.4 9.6  MG 2.2  --   --   --   --   PHOS  --   --   --  3.8 3.5   Liver Function Tests: Recent Labs  Lab  04/23/24 1105 04/24/24 0431 04/25/24 0452  AST 27  --   --   ALT 12  --   --   ALKPHOS 67  --   --   BILITOT 0.4  --   --   PROT 4.9*  --   --   ALBUMIN  2.2* 2.0* 2.0*   CBG: No results for input(s): GLUCAP in the last 168 hours.  Discharge time spent: greater than 30 minutes.  Signed: Elidia Toribio Furnace, MD [facility] 04/25/2024

## 2024-04-25 NOTE — Progress Notes (Addendum)
 Rounding Note   Patient Name: Catherine [name] Date of Encounter: 04/25/2024  [facility] Cardiologist: Annabella Scarce, MD   Subjective  Pt says breathing is OK at rest    No dizziness  Scheduled Meds:  apixaban   2.5 mg Oral BID   bisacodyl   10 mg Rectal Once   diazepam   5 mg Oral QHS   empagliflozin   10 mg Oral Daily   feeding supplement  1 Container Oral BID BM   feeding supplement  237 mL Oral BID BM   midodrine   5 mg Oral BID WC   multivitamin with minerals  1 tablet Oral Daily   mupirocin  cream   Topical Daily   polyethylene glycol  17 g Oral QHS   zinc  sulfate (50mg  elemental zinc )  220 mg Oral Daily   Continuous Infusions:  PRN Meds: HYDROcodone -acetaminophen , ipratropium-albuterol , ondansetron  (ZOFRAN ) IV, senna   Vital Signs  Vitals:   04/24/24 1932 04/25/24 0316 04/25/24 0555 04/25/24 0830  BP: (!) 104/52 (!) 98/54  (!) 108/52  Pulse: 62   66  Resp: 17 18  18   Temp: 98.4 F (36.9 C) 98.1 F (36.7 C)  98.4 F (36.9 C)  TempSrc: Oral Oral  Oral  SpO2: 93% 93%  92%  Weight:   55.1 kg   Height:        Intake/Output Summary (Last 24 hours) at 04/25/2024 0837 Last data filed at 04/25/2024 0315 Gross per 24 hour  Intake 220 ml  Output 500 ml  Net -280 ml   Positive 1.5  L        04/25/2024    5:55 AM 04/15/2024    3:29 PM 04/13/2024    3:59 PM  Last 3 Weights  Weight (lbs) 121 lb 7.6 oz 115 lb 117 lb  Weight (kg) 55.1 kg 52.164 kg 53.071 kg      Telemetry  AV paced and short bursts of SVT   - Personally Reviewed  ECG  No new  - Personally Reviewed  Physical Exam  GEN: No acute distress.   Neck: JVP is not elevated Cardiac: RRR, III/VI systolic murmur LSB    Respiratory: Clear to auscultation anteriorly  GI: Soft, nontender, non-distended  MS: 1+ edema in feet  Triv in ankles    Skin is thin, friable  Labs High Sensitivity Troponin:  No results for input(s): TROPONINIHS in the last 720 hours.   Chemistry Recent Labs  Lab  04/22/24 0458 04/23/24 0911 04/23/24 1105 04/24/24 0431 04/25/24 0452  NA 136   < > 133* 135 132*  K 3.7   < > 4.2 4.6 4.8  CL 98   < > 97* 98 98  CO2 27   < > 27 29 28   GLUCOSE 99   < > 128* 96 100*  BUN 37*   < > 42* 43* 42*  CREATININE 1.29*   < > 1.38* 1.47* 1.22*  CALCIUM  9.6   < > 9.3 9.4 9.6  MG 2.2  --   --   --   --   PROT  --   --  4.9*  --   --   ALBUMIN   --   --  2.2* 2.0* 2.0*  AST  --   --  27  --   --   ALT  --   --  12  --   --   ALKPHOS  --   --  67  --   --   BILITOT  --   --  0.4  --   --   GFRNONAA 40*   < > 37* 35* 43*  ANIONGAP 11   < > 9 8 6    < > = values in this interval not displayed.    Lipids No results for input(s): CHOL, TRIG, HDL, LABVLDL, LDLCALC, CHOLHDL in the last 168 hours.  Hematology Recent Labs  Lab 04/19/24 0411  WBC 6.4  RBC 3.35*  HGB 10.4*  HCT 30.6*  MCV 91.3  MCH 31.0  MCHC 34.0  RDW 13.5  PLT 125*   Thyroid  No results for input(s): TSH, FREET4 in the last 168 hours.  BNPNo results for input(s): BNP, PROBNP in the last 168 hours.  DDimer No results for input(s): DDIMER in the last 168 hours.   Radiology  ECHOCARDIOGRAM COMPLETE Result Date: 04/24/2024    ECHOCARDIOGRAM REPORT   Patient Name:   Catherine [name] Date of Exam: 04/24/2024 Medical Rec #:  [id number]      Height:       63.0 in Accession #:    [id number]     Weight:       115.0 lb Date of Birth:  Apr 19, 1935     BSA:          1.528 m Patient Age:    88 years       BP:           139/64 mmHg Patient Gender: F              HR:           61 bpm. Exam Location:  Inpatient Procedure: 2D Echo, Cardiac Doppler and Color Doppler (Both Spectral and Color            Flow Doppler were utilized during procedure). Indications:    CHF-Acute Diastolic I50.31  History:        Patient has prior history of Echocardiogram examinations, most                 recent 11/02/2023. CHF, CAD, Prior CABG, COPD and CKD, stage 3,                 Arrythmias:Atrial Fibrillation,  Signs/Symptoms:Dyspnea; Risk                 Factors:Hypertension and Dyslipidemia.                  Mitral Valve: 29 mm Medtronic bioprosthetic valve valve is                 present in the mitral position. Procedure Date: 10/14/2016.  Sonographer:    Thea Norlander RCS Referring Phys: [id number] Kathan Kirker V Emmalin Jaquess IMPRESSIONS  1. No resting LVOT gradient. Left ventricular ejection fraction, by estimation, is 70 to 75%. The left ventricle has hyperdynamic function. The left ventricle has no regional wall motion abnormalities. Left ventricular diastolic parameters are indeterminate.  2. Right ventricular systolic function is mildly reduced. The right ventricular size is normal. There is moderately elevated pulmonary artery systolic pressure. The estimated right ventricular systolic pressure is 50.5 mmHg.  3. Left atrial size was severely dilated.  4. Right atrial size was mildly dilated.  5. Small, echodensity noted on the chordal apparatus unchanged from 2023 PLAX view. There new LVOT is small related to valve struts with LVOT flow acceleration. The mitral valve has been repaired/replaced. No evidence of mitral valve regurgitation. The mean mitral valve gradient is 8.0 mmHg. There is a 29 mm Medtronic bioprosthetic valve  present in the mitral position. Procedure Date: 10/14/2016.  6. Tricuspid valve regurgitation is moderate.  7. The aortic valve is tricuspid. Aortic valve regurgitation is not visualized. Mild to moderate aortic valve stenosis. Aortic valve area, by VTI measures 1.04 cm. Aortic valve mean gradient measures 19.0 mmHg. Aortic valve Vmax measures 3.02 m/s.  8. The inferior vena cava is dilated in size with <50% respiratory variability, suggesting right atrial pressure of 15 mmHg. Comparison(s): Prior images reviewed side by side. Mitral gradients have increased from 2023 with similar heart rates (60s); aortic valve gradients have increased; a portion of this is related to dynamic LV function. FINDINGS  Left  Ventricle: No resting LVOT gradient. Left ventricular ejection fraction, by estimation, is 70 to 75%. The left ventricle has hyperdynamic function. The left ventricle has no regional wall motion abnormalities. The left ventricular internal cavity size was normal in size. There is no left ventricular hypertrophy. Left ventricular diastolic parameters are indeterminate. Right Ventricle: The right ventricular size is normal. No increase in right ventricular wall thickness. Right ventricular systolic function is mildly reduced. There is moderately elevated pulmonary artery systolic pressure. The tricuspid regurgitant velocity is 2.98 m/s, and with an assumed right atrial pressure of 15 mmHg, the estimated right ventricular systolic pressure is 50.5 mmHg. Left Atrium: Left atrial size was severely dilated. Right Atrium: Right atrial size was mildly dilated. Pericardium: There is no evidence of pericardial effusion. Mitral Valve: Small, echodensity noted on the chordal apparatus unchanged from 2023 PLAX view. There new LVOT is small related to valve struts with LVOT flow acceleration. The mitral valve has been repaired/replaced. No evidence of mitral valve regurgitation. There is a 29 mm Medtronic bioprosthetic valve present in the mitral position. Procedure Date: 10/14/2016. MV peak gradient, 23.7 mmHg. The mean mitral valve gradient is 8.0 mmHg with average heart rate of 60 bpm. Tricuspid Valve: The tricuspid valve is normal in structure. Tricuspid valve regurgitation is moderate . No evidence of tricuspid stenosis. Aortic Valve: The aortic valve is tricuspid. Aortic valve regurgitation is not visualized. Mild to moderate aortic stenosis is present. Aortic valve mean gradient measures 19.0 mmHg. Aortic valve peak gradient measures 36.5 mmHg. Aortic valve area, by VTI measures 1.04 cm. Pulmonic Valve: The pulmonic valve was normal in structure. Pulmonic valve regurgitation is trivial. No evidence of pulmonic stenosis.  Aorta: The aortic root and ascending aorta are structurally normal, with no evidence of dilitation. Venous: The inferior vena cava is dilated in size with less than 50% respiratory variability, suggesting right atrial pressure of 15 mmHg. IAS/Shunts: The atrial septum is grossly normal.  LEFT VENTRICLE PLAX 2D LVIDd:         4.57 cm   Diastology LVIDs:         3.08 cm   LV e' medial:    6.16 cm/s LV PW:         0.64 cm   LV E/e' medial:  32.3 LV IVS:        0.93 cm   LV e' lateral:   8.92 cm/s LVOT diam:     1.60 cm   LV E/e' lateral: 22.3 LV SV:         63 LV SV Index:   41 LVOT Area:     2.01 cm  RIGHT VENTRICLE            IVC RV S prime:     7.22 cm/s  IVC diam: 2.27 cm TAPSE (M-mode): 1.6 cm LEFT ATRIUM  Index        RIGHT ATRIUM           Index LA diam:        5.80 cm  3.79 cm/m   RA Area:     21.15 cm LA Vol (A2C):   97.8 ml  63.99 ml/m  RA Volume:   59.55 ml  38.96 ml/m LA Vol (A4C):   119.0 ml 77.86 ml/m LA Biplane Vol: 108.0 ml 70.66 ml/m  AORTIC VALVE AV Area (Vmax):    1.09 cm AV Area (Vmean):   1.05 cm AV Area (VTI):     1.04 cm AV Vmax:           302.00 cm/s AV Vmean:          198.000 cm/s AV VTI:            0.604 m AV Peak Grad:      36.5 mmHg AV Mean Grad:      19.0 mmHg LVOT Vmax:         164.00 cm/s LVOT Vmean:        103.000 cm/s LVOT VTI:          0.313 m LVOT/AV VTI ratio: 0.52  AORTA Ao Root diam: 3.15 cm Ao Asc diam:  3.50 cm MITRAL VALVE                TRICUSPID VALVE MV Area (PHT): 2.34 cm     TR Peak grad:   35.5 mmHg MV Area VTI:   0.94 cm     TR Vmax:        298.00 cm/s MV Peak grad:  23.7 mmHg MV Mean grad:  8.0 mmHg     SHUNTS MV Vmax:       2.44 m/s     Systemic VTI:  0.31 m MV Vmean:      110.0 cm/s   Systemic Diam: 1.60 cm MV Decel Time: 324 msec MV E velocity: 199.00 cm/s Stanly Leavens MD Electronically signed by Stanly Leavens MD Signature Date/Time: 04/24/2024/5:19:23 PM    Final    IR THORACENTESIS ASP PLEURAL SPACE W/IMG GUIDE Result Date:  04/24/2024 INDICATION: In the hospital with CHF exacerbation. Right pleural effusion. IR consulted for diagnostic and therapeutic right thoracentesis. EXAM: ULTRASOUND GUIDED DIAGNOSTIC AND THERAPEUTIC RIGHT THORACENTESIS MEDICATIONS: 6 mL 1% lidocaine  COMPLICATIONS: None immediate. PROCEDURE: An ultrasound guided thoracentesis was thoroughly discussed with the patient and questions answered. The benefits, risks, alternatives and complications were also discussed. The patient understands and wishes to proceed with the procedure. Written consent was obtained. Ultrasound was performed to localize and mark an adequate pocket of fluid in the right chest. The area was then prepped and draped in the normal sterile fashion. 1% Lidocaine  was used for local anesthesia. Under ultrasound guidance a 6 Fr Safe-T-Centesis catheter was introduced. Thoracentesis was performed. The catheter was removed and a dressing applied. FINDINGS: A total of approximately 1.1 liters of clear yellow fluid was removed. Samples were sent to the laboratory as requested by the clinical team. IMPRESSION: Successful ultrasound guided right thoracentesis yielding 1.1 liters of pleural fluid. Performed and dictated by Kimble Clas, PA-C Electronically Signed   By: CHRISTELLA.  Shick M.D.   On: 04/24/2024 14:42   DG Chest Port 1 View Result Date: 04/24/2024 CLINICAL DATA:  Status post thoracentesis. EXAM: PORTABLE CHEST 1 VIEW COMPARISON:  Chest radiograph dated 04/24/2019 side. FINDINGS: Interval decrease in the size of right pleural effusion with improved aeration of the  right lung base. No pneumothorax. Small left pleural effusion as seen on the prior radiograph. There is cardiomegaly with vascular congestion. Median sternotomy wires and left pectoral pacemaker device. No acute osseous pathology. IMPRESSION: Interval decrease in the size of right pleural effusion with improved aeration of the right lung base. No pneumothorax. Electronically Signed   By:  Vanetta Chou M.D.   On: 04/24/2024 14:30   DG Chest 1 View Result Date: 04/23/2024 CLINICAL DATA:  Follow-up EXAM: CHEST  1 VIEW COMPARISON:  04/15/2024 FINDINGS: Enlarging right pleural effusion, now moderate. Stable small left pleural effusion. Bilateral lower lobe airspace opacities are noted, right greater than left, worsening bilaterally since prior study. There is cardiomegaly. Pacer remains in place, unchanged. Prior CABG. IMPRESSION: Enlarging moderate right pleural effusion. Stable small left pleural effusion. Worsening bilateral lower lobe airspace disease, right greater than left. Cardiomegaly. Electronically Signed   By: Franky Crease M.D.   On: 04/23/2024 18:51    Cardiac Studies  Echo  JUly 2025   1. No resting LVOT gradient. Left ventricular ejection fraction, by  estimation, is 70 to 75%. The left ventricle has hyperdynamic function.  The left ventricle has no regional wall motion abnormalities. Left  ventricular diastolic parameters are  indeterminate.   2. Right ventricular systolic function is mildly reduced. The right  ventricular size is normal. There is moderately elevated pulmonary artery  systolic pressure. The estimated right ventricular systolic pressure is  50.5 mmHg.   3. Left atrial size was severely dilated.   4. Right atrial size was mildly dilated.   5. Small, echodensity noted on the chordal apparatus unchanged from 2023  PLAX view. There new LVOT is small related to valve struts with LVOT flow  acceleration. The mitral valve has been repaired/replaced. No evidence of  mitral valve regurgitation. The  mean mitral valve gradient is 8.0 mmHg. There is a 29 mm Medtronic  bioprosthetic valve present in the mitral position. Procedure Date:  10/14/2016.   6. Tricuspid valve regurgitation is moderate.   7. The aortic valve is tricuspid. Aortic valve regurgitation is not  visualized. Mild to moderate aortic valve stenosis. Aortic valve area, by  VTI measures  1.04 cm. Aortic valve mean gradient measures 19.0 mmHg.  Aortic valve Vmax measures 3.02 m/s.   8. The inferior vena cava is dilated in size with <50% respiratory  variability, suggesting right atrial pressure of 15 mmHg.   Comparison(s): Prior images reviewed side by side. Mitral gradients have  increased from 2023 with similar heart rates (60s); aortic valve gradients  have increased; a portion of this is related to dynamic LV function.    Echo   Feb 2025  1. Left ventricular ejection fraction, by estimation, is 65 to 70%. The  left ventricle has normal function. The left ventricle has no regional  wall motion abnormalities. There is moderate left ventricular hypertrophy.  Left ventricular diastolic  parameters are indeterminate.   2. Right ventricular systolic function is mildly reduced. The right  ventricular size is mildly enlarged. There is mildly elevated pulmonary  artery systolic pressure. The estimated right ventricular systolic  pressure is 37.8 mmHg.   3. Left atrial size was severely dilated.   4. Right atrial size was moderately dilated.   5. The mitral valve has been repaired/replaced. Trivial mitral valve  regurgitation. The mean mitral valve gradient is 3.6 mmHg. There is a 29  mm Medtronic Mosaic Porcine bioprosthetic valve present in the mitral  position. Procedure Date: 10/14/2016.  Echo   findings are consistent with normal structure and function of the mitral  valve prosthesis.   6. Tricuspid valve regurgitation is mild to moderate.   7. The aortic valve is abnormal. There is moderate calcification of the  aortic valve. Aortic valve regurgitation is not visualized. Mild-Moderate  aortic valve stenosis. Aortic valve area, by VTI measures 0.85 cm. Aortic  valve mean gradient measures  15.3 mmHg. Aortic valve Vmax measures 2.74 m/s. Aortic valve acceleration  time measures 90 msec. DVI 0.48.   8. The inferior vena cava is dilated in size with >50% respiratory   variability, suggesting right atrial pressure of 8 mmHg.   Assessment & Plan   1 CHF  Presents with volume overload  Pt says it has been developing for at least a couple months    Initially treated with IV lasix    Held  with bump in Cr  Cr is down to 1.22 today     Note Albumin  2.0  contributing to 3rd spacing of fluid   Echo yesterday noted above    LV function is vigorous    There is turbulent flow through LVOT with no significant outflow gradient     This is when she is in SR (not SVT)  Did receive thoracentesis yesterday 1L  Overall would not overdiurese patient     I think rhythm control will also be important  Working with EP to get an earlier date for ablation (per EP they had offered her earlier dates when she was in clinic last, but she chose September so daughter could be here )  Recomm     She will need some diuretic at home   Very difficult to know how much   [facility] needs close follow up     She is now on Jardiance  10mg     Really need watch out for UTIs/yeast infection  in this petite 89 yo   Pt could weigh self and dose 1 mg Bumex  prm   2  Hx PAF  hx surgical MAZE 2018  CUrrently on Eliquis  bid  3 Hx SN dysfunction   S/P PPM in Dec 2024     4  Hx SVT  Pt has had bursts of SVT (Junctional tachy)   Ths predated PPM placement   She has been intolerant to meds in past   Plan for ablation  This may help her as she will have A-V conduction only         5  Hypotension  Pt started on midodrine  several days ago   BP 80s to 110s   Keep on this     6 CAD  s/p CABG 2018 (SVG to RCA)     No symptoms of angina   7  Hx MV dz  (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

## 2024-04-26 ENCOUNTER — Telehealth: Payer: Self-pay

## 2024-04-26 LAB — PROTEIN ELECTRO, RANDOM URINE
Albumin ELP, Urine: 75.1 %
Alpha-1-Globulin, U: 6.3 %
Alpha-2-Globulin, U: 4.2 %
Beta Globulin, U: 9.4 %
Gamma Globulin, U: 4.9 %
M Component, Ur: 2.6 % — ABNORMAL HIGH
Total Protein, Urine: 1283.9 mg/dL

## 2024-04-26 NOTE — Transitions of Care (Post Inpatient/ED Visit) (Signed)
   04/26/2024  Name: Catherine [name] MRN: [id number] DOB: [birth year]  Today's TOC FU Call Status: Today's TOC FU Call Status:: Unsuccessful Call (1st Attempt) Unsuccessful Call (1st Attempt) Date: 04/26/24  Attempted to reach the patient regarding the most recent Inpatient/ED visit.  Follow Up Plan: Additional outreach attempts will be made to reach the patient to complete the Transitions of Care (Post Inpatient/ED visit) call.   Alan Ee, RN, BSN, CEN [employer]- Transition of Care Team.  [facility] [phone]

## 2024-04-26 NOTE — Transitions of Care (Post Inpatient/ED Visit) (Signed)
 04/26/2024  Name: Catherine [name] MRN: [id number] DOB: Dec 24, 1934  Today's TOC FU Call Status: Today's TOC FU Call Status:: Successful TOC FU Call Completed TOC FU Call Complete Date: 04/26/24 Patient's Name and Date of Birth confirmed.  Transition Care Management Follow-up Telephone Call Date of Discharge: 04/25/24 Discharge Facility: [facility]) Type of Discharge: Inpatient Admission How have you been since you were released from the hospital?: Same (does not feel good, c/o nausea, swelling, 1 compression hose, sore on leg.) Any questions or concerns?: No  Items Reviewed: Did you receive and understand the discharge instructions provided?: Yes Medications obtained,verified, and reconciled?: Yes (Medications Reviewed) Any new allergies since your discharge?: No Dietary orders reviewed?: Yes Type of Diet Ordered:: low salt Do you have support at home?: Yes People in Home [RPT]: spouse, child(ren), adult  Medications Reviewed Today: Medications Reviewed Today     Reviewed by Rumalda Alan PENNER, RN (Registered Nurse) on 04/26/24 at 1614  Med List Status: <None>   Medication Order Taking? Sig Documenting Provider Last Dose Status Informant  acetaminophen  (TYLENOL ) 500 MG tablet [id number] No Take 500 mg by mouth every 6 (six) hours as needed for mild pain (pain score 1-3). [provider] Past Week Active Self, Pharmacy Records           Med Note (COFFELL, JON CHRISTELLA   Sun Mar 25, 2024 11:26 AM)    albuterol  (PROVENTIL ) (2.5 MG/3ML) 0.083% nebulizer solution [id number] No Take 3 mLs (2.5 mg total) by nebulization every 6 (six) hours as needed (during COPD Exacerbation). Hope Almarie ORN, NP 04/16/2024 Morning Active Self, Pharmacy Records  albuterol  (VENTOLIN  HFA) 108 (90 Base) MCG/ACT inhaler [id number] No INHALE 2 PUFFS INTO THE LUNGS EVERY 4 HOURS AS NEEDED FOR AHEEZING OR SHORTNESS OF BREATH Parrett, Tammy S, NP 04/16/2024 Morning Active Self, Pharmacy Records  apixaban   (ELIQUIS ) 2.5 MG TABS tablet [id number] No TAKE ONE TABLET TWICE DAILY (START 09/18/23 OR AS DIRECTED) Micheal Wolm ORN, MD 04/15/2024  8:00 AM Active Self, Pharmacy Records  bumetanide  (BUMEX ) 1 MG tablet [id number]  Take 1 tablet (1 mg total) by mouth daily as needed (for worsening lower extremity swelling, weight gain 2 to 3 lbs in 24 hrs or 5 lbs in 7 days). Arrien, Elidia Sieving, MD  Active   cholecalciferol (VITAMIN D3) 25 MCG (1000 UNIT) tablet [id number] No Take 1,000 Units by mouth daily. [provider] 04/15/2024 Active Self, Pharmacy Records  diazepam  (VALIUM ) 5 MG tablet [id number] No TAKE ONE TABLET BY MOUTH AT BEDTIME Micheal Wolm ORN, MD 04/14/2024 Active Self, Pharmacy Records  empagliflozin  (JARDIANCE ) 10 MG TABS tablet [id number]  Take 1 tablet (10 mg total) by mouth daily. Arrien, Elidia Sieving, MD  Active   estradiol  (ESTRACE ) 0.1 MG/GM vaginal cream [id number] No Place 1 Applicatorful vaginally See admin instructions. Apply a small amount vaginally three nights a week on Monday, Wednesday and Friday. [provider] 04/13/2024 Active Self, Pharmacy Records  feeding supplement (ENSURE PLUS HIGH PROTEIN) LIQD [id number]  Take 237 mLs by mouth 2 (two) times daily between meals. Arrien, Mauricio Daniel, MD  Active   fluticasone  (FLONASE ) 50 MCG/ACT nasal spray [id number] No Place 1 spray into both nostrils daily as needed for allergies or rhinitis. Sood, Vineet, MD Past Week Active Self, Pharmacy Records  guaiFENesin  (MUCINEX ) 600 MG 12 hr tablet [id number]  Take 1 tablet (600 mg total) by mouth 2 (two) times daily as needed for to loosen phlegm or cough. Arrien, Elidia Sieving, MD  Active   HYDROcodone -acetaminophen  (NORCO/VICODIN) 5-325 MG tablet [id number] No TAKE ONE TABLET EVERY 8 HOURS AS NEEDED FOR PAIN Micheal Wolm ORN, MD 04/15/2024 Active Self, Pharmacy Records  midodrine  (PROAMATINE ) 5 MG tablet [id number]  Take 1 tablet (5 mg total) by mouth 2 (two) times daily  with a meal. Arrien, Elidia Sieving, MD  Active   Multiple Vitamin (MULTIVITAMIN WITH MINERALS) TABS tablet [id number]  Take 1 tablet by mouth daily. Arrien, Elidia Sieving, MD  Active   mupirocin  ointment (BACTROBAN ) 2 % [id number] No APPLY TOPICALLY ONCE A DAY Micheal Wolm ORN, MD 04/15/2024 Active Self, Pharmacy Records  ondansetron  (ZOFRAN ) 4 MG tablet [id number]  Take 1 tablet (4 mg total) by mouth every 8 (eight) hours as needed for nausea or vomiting. Arrien, Elidia Sieving, MD  Active           Patient declined to review medications as she states that her daughter got them all straight today. Patient reports that she has put up all her paperwork.   Home Care and Equipment/Supplies: Were Home Health Services Ordered?: No Any new equipment or medical supplies ordered?: No  Functional Questionnaire: Do you need assistance with bathing/showering or dressing?: No Do you need assistance with meal preparation?: Yes Do you need assistance with eating?: No Do you have difficulty maintaining continence: No Do you need assistance with getting out of bed/getting out of a chair/moving?: No Do you have difficulty managing or taking your medications?: No  Follow up appointments reviewed: PCP Follow-up appointment confirmed?: No (Patient reports that she will call and make her own appointments.) MD Provider Line Number:[phone] Given: No Specialist Hospital Follow-up appointment confirmed?: No Reason Specialist Follow-Up Not Confirmed: Patient has Specialist Provider Number and will Call for Appointment Do you need transportation to your follow-up appointment?: No (reports husband drives to appointments) Do you understand care options if your condition(s) worsen?: Yes-patient verbalized understanding  SDOH Interventions Today    Flowsheet Row Most Recent Value  SDOH Interventions   Food Insecurity Interventions Intervention Not Indicated  Housing Interventions Intervention Not  Indicated  Transportation Interventions Intervention Not Indicated  Utilities Interventions Intervention Not Indicated  Depression Interventions/Treatment  Medication, Patient refuses Treatment    Goals Addressed             This Visit's Progress    [facility] Transitions of Care (TOC) Care Plan       Problems:Reactivated goal  Discharged on 04/25/2024 Readmission for CHF.  04/26/2024  Patient reports that she continues to have a lot of swelling in her legs. Reports that she follows her low salt diet and takes her medications as prescribed. Reports she wears her compression hose ( only 1 leg due to wound). Reports that she keeps her legs elevated Bilateral Leg Swelling: 04/26/2024  Per EMR patient is expected to have refractory edema Protein malnutrition:  04/26/2024  Patient reports that she has lost 35 pounds since January, due to decrease appetite after pacemaker insertion.  Wound on leg- 04/26/2024 old wound on lower leg after hitting leg on dishwasher. Pending follow up with ortho in 1 week.   Goal:  Over the next 30 days, the patient will not experience hospital readmission  Interventions:  Transitions of Care:   Encouraged patient to continue to weigh daily, follow her low salt diet, elevate legs and wear her compression hose.   Continue to take medications as prescribed.  Provided a listening ear and offered support and reassurance Reviewed anxiety score and send to MD.  [place]  LCSW referral and patient declined.  Reviewed nervous episode today when trying to figure out her medications.  Daughter came from work to assist. Reviewed concern about nutrition and encouraged patient to increase her protein intake.  Reviewed suggestion of drinking ensure between meals. Encouraged patient to eat more often.  Reviewed protein options Reviewed signs of infection and when to call MD Reviewed how to reach medical attention 24/7. Reminded patient that MD has someone on call if she needs  assistance Reviewed and offered 30 day TOC program and patient has consented.  Provided my contact information. Next appointment schedule for next week.  Encouraged patient to continue to weigh daily and call MD for weight gain as directed.  Follow up with providers as suggested Continue to do dressing changes as per discharge instructions.    Patient Self Care Activities:  Attend all scheduled provider appointments Call pharmacy for medication refills 3-7 days in advance of running out of medications Call provider office for new concerns or questions  Notify RN Care Manager of TOC call rescheduling needs Participate in Transition of Care Program/Attend TOC scheduled calls Perform all self care activities independently  Perform IADL's (shopping, preparing meals, housekeeping, managing finances) independently Take medications as prescribed   Continue to weigh daily and follow your low salt diet. Continues to elevate your legs and wear your compression hose.  Call MD for weight gain.  Call MD's offices and schedule your follow up appointments. Call me if you are having difficulty with this. Continue to weigh daily and record your weights Observe wound for signs of infections Try relaxation techniques to help with feeling overwhelmed.  Eat more protein Be careful not to fall on the blood thinners. If  you fall and hit your head- seek medical attention.  Plan: Next appointment for transition of care with Alan Ee in 1 week 05/03/2024 at 10am.   Patient provided with my contact information to call me if needed.         Alan Ee, RN, BSN, CEN [employer]- Transition of Care Team.  [facility] [phone]

## 2024-04-29 LAB — CULTURE, BODY FLUID W GRAM STAIN -BOTTLE: Culture: NO GROWTH

## 2024-04-30 ENCOUNTER — Ambulatory Visit (INDEPENDENT_AMBULATORY_CARE_PROVIDER_SITE_OTHER): Admitting: Family Medicine

## 2024-04-30 ENCOUNTER — Encounter: Payer: Self-pay | Admitting: Family Medicine

## 2024-04-30 VITALS — BP 100/60 | HR 71 | Temp 98.2°F | Wt 122.9 lb

## 2024-04-30 DIAGNOSIS — I495 Sick sinus syndrome: Secondary | ICD-10-CM

## 2024-04-30 DIAGNOSIS — E43 Unspecified severe protein-calorie malnutrition: Secondary | ICD-10-CM

## 2024-04-30 DIAGNOSIS — R5383 Other fatigue: Secondary | ICD-10-CM

## 2024-04-30 DIAGNOSIS — Z953 Presence of xenogenic heart valve: Secondary | ICD-10-CM

## 2024-04-30 DIAGNOSIS — I959 Hypotension, unspecified: Secondary | ICD-10-CM

## 2024-04-30 DIAGNOSIS — I5033 Acute on chronic diastolic (congestive) heart failure: Secondary | ICD-10-CM

## 2024-04-30 DIAGNOSIS — N1831 Chronic kidney disease, stage 3a: Secondary | ICD-10-CM

## 2024-04-30 DIAGNOSIS — R600 Localized edema: Secondary | ICD-10-CM

## 2024-04-30 NOTE — Progress Notes (Signed)
 Established Patient Office Visit  Subjective   Patient ID: Catherine [name], female    DOB: 1935/08/24  Age: 89 y.o. MRN: [id number]  Chief Complaint  Patient presents with   Hospitalization Follow-up    HPI   Catherine [name] is seen for hospital follow-up companied by husband.  She had recent lengthy admission 7-20 through 7-30.  Admitted with heart failure exacerbation.  She has history of SVT, junctional tachycardia, A-fib, history of bioprosthetic mitral valve replacement, history of CABG, diastolic heart failure, COPD, chronic back pain who presented to the ED with increased cough and worsening edema and dyspnea.  She had already increased dosage of Bumex  at home.  Lungs revealed bilateral rales and increased 3+ bilateral lower extremity edema.  Initial potassium 2.8.  Chest x-ray showed prominent pulmonary vasculature with small right pleural effusion.  No infiltrates.  She had proteinuria on urinalysis.  Patient received furosemide  for diuresis but was limited in therapy secondary to hypotension.  Had thoracentesis day before discharge with removal of 1.1 L.  Echocardiogram EF 70 to 75%.  Severe left atrial dilation.  Losartan  held secondary to hypotension.  Patient discharged on midodrine  5 mg twice daily for blood pressure.  History of sinus node dysfunction status post pacemaker 12/24.  She had burst of SVT and junctional tachycardia and plan for ablation as an outpatient.  Did have nephrotic range proteinuria.  Discharged on Bumex  as needed, Eliquis , and Jardiance  10 mg daily.  She has bilateral lower extremity edema now but dyspnea is stable.  She has some chronic dyspnea with exertion.  Unable to tolerate compression.  Recent discontinuation of Entresto  and metolazone .  Past Medical History:  Diagnosis Date   AKI (acute kidney injury) (HCC) 06/22/2022   Aortic stenosis, mild 07/26/2017   Arthritis    Asthma    last attack 02/2015   Cellulitis of left lower extremity 08/17/2016    Ulcer associated with severe venous insufficiency   Chronic anticoagulation    Chronic diastolic CHF (congestive heart failure) (HCC)    Chronic kidney disease    RIGHT MANY KIDNEY INFECTIONS AND STONES   COPD (chronic obstructive pulmonary disease) (HCC)    Coronary artery disease    Dizziness    H/O: rheumatic fever    Hypertension    PAF (paroxysmal atrial fibrillation) (HCC)    PONV (postoperative nausea and vomiting)    ' SOMETIMES', BUT NOT ALWAYS   Primary hypertension    RHEUMATIC MITRAL STENOSIS 01/05/2008   Qualifier: Diagnosis of   By: Neysa MD, Clinton D        S/P Maze operation for atrial fibrillation 10/14/2016   Complete bilateral atrial lesion set using cryothermy and bipolar radiofrequency ablation - atrial appendage was not treated due to previous surgical procedure (open mitral commissurotomy)   S/P mitral valve replacement with bioprosthetic valve 10/14/2016   29 mm Medtronic Mosaic porcine bioprosthetic tissue valve   Sciatica 08/29/2023   Tricuspid regurgitation 07/14/2021   UTI (urinary tract infection)    Valvular heart disease    Has mitral stenosis with prior mitral commissurotomy in 1970   Past Surgical History:  Procedure Laterality Date   ABDOMINAL HYSTERECTOMY  1983   endometriosis   APPENDECTOMY     BACK SURGERY     neurosurgery x2   CARDIAC CATHETERIZATION     CARDIAC CATHETERIZATION N/A 08/03/2016   Procedure: Right/Left Heart Cath and Coronary Angiography;  Surgeon: Peter M Swaziland, MD;  Location: [facility] INVASIVE CV LAB;  Service: Cardiovascular;  Laterality: N/A;   cataract surg     CHOLECYSTECTOMY N/A 05/30/2015   Procedure: LAPAROSCOPIC CHOLECYSTECTOMY WITH INTRAOPERATIVE CHOLANGIOGRAM;  Surgeon: Morene Olives, MD;  Location: [facility] OR;  Service: General;  Laterality: N/A;   COLONOSCOPY     CORONARY ARTERY BYPASS GRAFT N/A 10/14/2016   Procedure: CORONARY ARTERY BYPASS GRAFTING (CABG);  Surgeon: Sudie VEAR Laine, MD;  Location: [facility] OR;   Service: Open Heart Surgery;  Laterality: N/A;   EYE SURGERY     IR THORACENTESIS ASP PLEURAL SPACE W/IMG GUIDE  04/24/2024   MAZE N/A 10/14/2016   Procedure: MAZE;  Surgeon: Sudie VEAR Laine, MD;  Location: [facility] OR;  Service: Open Heart Surgery;  Laterality: N/A;   MITRAL VALVE REPLACEMENT N/A 10/14/2016   Procedure: REDO MITRAL VALVE REPLACEMENT (MVR);  Surgeon: Sudie VEAR Laine, MD;  Location: [facility] OR;  Service: Open Heart Surgery;  Laterality: N/A;   MITRAL VALVE SURGERY Left 1970   Open mitral commissurotomy via left thoracotomy approach   PACEMAKER IMPLANT N/A 09/22/2023   Procedure: PACEMAKER IMPLANT;  Surgeon: Kennyth Chew, MD;  Location: [facility] INVASIVE CV LAB;  Service: Cardiovascular;  Laterality: N/A;   TEE WITHOUT CARDIOVERSION N/A 07/05/2016   Procedure: TRANSESOPHAGEAL ECHOCARDIOGRAM (TEE);  Surgeon: Annabella Scarce, MD;  Location: [facility] ENDOSCOPY;  Service: Cardiovascular;  Laterality: N/A;   TEE WITHOUT CARDIOVERSION N/A 10/14/2016   Procedure: TRANSESOPHAGEAL ECHOCARDIOGRAM (TEE);  Surgeon: Sudie VEAR Laine, MD;  Location: [facility] OR;  Service: Open Heart Surgery;  Laterality: N/A;    reports that she quit smoking about 48 years ago. Her smoking use included cigarettes. She started smoking about 63 years ago. She has a 15 pack-year smoking history. She has never used smokeless tobacco. She reports that she does not drink alcohol and does not use drugs. family history includes Leukemia in her father. Allergies  Allergen Reactions   Aldactone  [Spironolactone ] Other (See Comments)    Dyspnea    Amoxil  [Amoxicillin ] Palpitations and Other (See Comments)    Tachycardia   Zebeta  [Bisoprolol ] Shortness Of Breath   Calan  Fabienne.Fisherman ] Other (See Comments)    Myalgias    Crestor  [Rosuvastatin ] Other (See Comments)    Myalgias    Flovent  [Fluticasone  Propionate] Other (See Comments)    Leg cramps   Livalo  [Pitavastatin ] Other (See Comments)    Myalgias    Lyrica [Pregabalin] Swelling    Neurontin [Gabapentin] Swelling   Norvasc  [Amlodipine ] Swelling    Low extremity edema   Zetia  [Ezetimibe ] Other (See Comments)    Myalgias   Zocor  [Simvastatin ] Other (See Comments)    Myalgias    Lotensin [Benazepril] Cough   Polytrim  [Polymyxin B -Trimethoprim ] Swelling     Review of Systems  Constitutional:  Positive for malaise/fatigue. Negative for chills and fever.  Eyes:  Negative for blurred vision.  Respiratory:  Negative for shortness of breath.   Cardiovascular:  Positive for leg swelling. Negative for chest pain.  Gastrointestinal:  Negative for abdominal pain.  Genitourinary:  Negative for dysuria.  Neurological:  Negative for dizziness and headaches.      Objective:     BP 100/60   Pulse 71   Temp 98.2 F (36.8 C) (Oral)   Wt 122 lb 14.4 oz (55.7 kg)   SpO2 94%   BMI 21.77 kg/m  BP Readings from Last 3 Encounters:  04/30/24 100/60  04/25/24 (!) 108/52  04/13/24 (!) 104/47   Wt Readings from Last 3 Encounters:  04/30/24 122 lb 14.4 oz (55.7 kg)  04/26/24 120 lb (  54.4 kg)  04/25/24 121 lb 7.6 oz (55.1 kg)      Physical Exam Vitals reviewed.  Constitutional:      Appearance: She is well-developed.  Eyes:     Pupils: Pupils are equal, round, and reactive to light.  Neck:     Thyroid : No thyromegaly.     Vascular: No JVD.  Cardiovascular:     Rate and Rhythm: Normal rate and regular rhythm.     Heart sounds:     No gallop.  Pulmonary:     Effort: Pulmonary effort is normal. No respiratory distress.     Breath sounds: Normal breath sounds. No wheezing or rales.  Musculoskeletal:     Cervical back: Neck supple.     Right lower leg: Edema present.     Left lower leg: Edema present.     Comments: She has at least 2+ pitting edema lower extremities bilaterally.  Very superficial area of skin breakdown left anterior shin but no surrounding cellulitis changes.  Neurological:     Mental Status: She is alert.      No results found for any  visits on 04/30/24.  Last CBC Lab Results  Component Value Date   WBC 6.4 04/19/2024   HGB 10.4 (L) 04/19/2024   HCT 30.6 (L) 04/19/2024   MCV 91.3 04/19/2024   MCH 31.0 04/19/2024   RDW 13.5 04/19/2024   PLT 125 (L) 04/19/2024   Last metabolic panel Lab Results  Component Value Date   GLUCOSE 100 (H) 04/25/2024   NA 132 (L) 04/25/2024   K 4.8 04/25/2024   CL 98 04/25/2024   CO2 28 04/25/2024   BUN 42 (H) 04/25/2024   CREATININE 1.22 (H) 04/25/2024   GFRNONAA 43 (L) 04/25/2024   CALCIUM  9.6 04/25/2024   PHOS 3.5 04/25/2024   PROT 4.9 (L) 04/23/2024   ALBUMIN  2.0 (L) 04/25/2024   LABGLOB 2.2 04/23/2024   AGRATIO 1.1 04/23/2024   BILITOT 0.4 04/23/2024   ALKPHOS 67 04/23/2024   AST 27 04/23/2024   ALT 12 04/23/2024   ANIONGAP 6 04/25/2024   Last thyroid  functions Lab Results  Component Value Date   TSH 4.180 03/24/2024      The ASCVD Risk score (Arnett DK, et al., 2019) failed to calculate for the following reasons:   The 2019 ASCVD risk score is only valid for ages 65 to 39    Assessment & Plan:   #1 acute on chronic diastolic heart failure.  Preserved EF on echo.  Treated with IV furosemide .  Still has bilateral leg edema. Lungs clear on exam today.   Had pulmonary edema with right pleural effusion with thoracentesis of 1.1 L clear transudate fluid.  Patient discharged on Bumex  and Jardiance .  History of intolerance with Aldactone .  Losartan  held secondary to hypotension.  #2 hypotension on recent admission.  Patient on midodrine  5 mg twice daily and blood pressure stable.  #3 chronic atrial fibrillation.  History of sinus node dysfunction status post pacemaker 12/24.  On Eliquis   #4 chronic kidney disease stage IIIa.  Nephrotic range proteinuria.  Increased Kappa free light chains.  Can see with multiple myeloma, but also kidney disease, chronic inflammation, or immune disorder.   Very slight M protein spike of 0.1 on electrophoresis- which is non-specific and  can be seen with MGUS or early Myeloma.  Will discuss with patient possible referral to hematology/oncology for further evaluation.   Wolm Scarlet, MD

## 2024-05-02 ENCOUNTER — Institutional Professional Consult (permissible substitution) (INDEPENDENT_AMBULATORY_CARE_PROVIDER_SITE_OTHER): Admitting: Otolaryngology

## 2024-05-03 ENCOUNTER — Ambulatory Visit (HOSPITAL_BASED_OUTPATIENT_CLINIC_OR_DEPARTMENT_OTHER): Admitting: Nurse Practitioner

## 2024-05-03 ENCOUNTER — Encounter (HOSPITAL_BASED_OUTPATIENT_CLINIC_OR_DEPARTMENT_OTHER): Payer: Self-pay | Admitting: Nurse Practitioner

## 2024-05-03 ENCOUNTER — Telehealth: Payer: Self-pay

## 2024-05-03 VITALS — BP 102/52 | HR 82 | Ht 63.0 in | Wt 123.7 lb

## 2024-05-03 DIAGNOSIS — N1831 Chronic kidney disease, stage 3a: Secondary | ICD-10-CM | POA: Diagnosis not present

## 2024-05-03 DIAGNOSIS — I4719 Other supraventricular tachycardia: Secondary | ICD-10-CM | POA: Diagnosis not present

## 2024-05-03 DIAGNOSIS — D6869 Other thrombophilia: Secondary | ICD-10-CM | POA: Diagnosis not present

## 2024-05-03 DIAGNOSIS — E43 Unspecified severe protein-calorie malnutrition: Secondary | ICD-10-CM

## 2024-05-03 DIAGNOSIS — Z950 Presence of cardiac pacemaker: Secondary | ICD-10-CM | POA: Diagnosis not present

## 2024-05-03 DIAGNOSIS — I959 Hypotension, unspecified: Secondary | ICD-10-CM

## 2024-05-03 DIAGNOSIS — I480 Paroxysmal atrial fibrillation: Secondary | ICD-10-CM

## 2024-05-03 DIAGNOSIS — R531 Weakness: Secondary | ICD-10-CM | POA: Diagnosis not present

## 2024-05-03 DIAGNOSIS — Z952 Presence of prosthetic heart valve: Secondary | ICD-10-CM | POA: Diagnosis not present

## 2024-05-03 DIAGNOSIS — R300 Dysuria: Secondary | ICD-10-CM

## 2024-05-03 DIAGNOSIS — I4729 Other ventricular tachycardia: Secondary | ICD-10-CM

## 2024-05-03 DIAGNOSIS — N179 Acute kidney failure, unspecified: Secondary | ICD-10-CM | POA: Diagnosis not present

## 2024-05-03 NOTE — Patient Instructions (Signed)
 Medication Instructions:   Your physician recommends that you continue on your current medications as directed. Please refer to the Current Medication list given to you today.   *If you need a refill on your cardiac medications before your next appointment, please call your pharmacy*  Lab Work:  None ordered.  If you have labs (blood work) drawn today and your tests are completely normal, you will receive your results only by: MyChart Message (if you have MyChart) OR A paper copy in the mail If you have any lab test that is abnormal or we need to change your treatment, we will call you to review the results.  Testing/Procedures:  None ordered.  Follow-Up: At [facility], you and your health needs are our priority.  As part of our continuing mission to provide you with exceptional heart care, our providers are all part of one team.  This team includes your primary Cardiologist (physician) and Advanced Practice Providers or APPs (Physician Assistants and Nurse Practitioners) who all work together to provide you with the care you need, when you need it.  Your next appointment:   1 month(s)  Provider:   Annabella Scarce, MD    We recommend signing up for the patient portal called MyChart.  Sign up information is provided on this After Visit Summary.  MyChart is used to connect with patients for Virtual Visits (Telemedicine).  Patients are able to view lab/test results, encounter notes, upcoming appointments, etc.  Non-urgent messages can be sent to your provider as well.   To learn more about what you can do with MyChart, go to [email or web address].   Other Instructions  You have been referred to Nephrology.   Cancel your ortho referral.

## 2024-05-03 NOTE — Progress Notes (Unsigned)
 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  In January 2018 she underwent bioprosthetic MVR, CABG x 1, and bilateral maze procedure with Dr. Dusty.   Seen by Dr. Scarce 11/2020 with improvement in abdominal bloating after transition from Lasix  to torsemide .  Her persistent LE edema was thought to be due to venous insufficiency.  She was unable to tolerate compression socks and declined referral to [facility].  Hospitalized 10/2021 with COPD exacerbation and HFpEF.  Echo LVEF 60 to 65%, RV mildly reduced, RVSP 58 mmHg, moderate BAE, normal-appearing bioprosthetic MVR, moderate TR.  She was admitted 01/05/2022 for acute on chronic diastolic heart failure with acute hypoxic respiratory failure and COPD exacerbation.  She developed bradycardia, metoprolol  was reduced.  She was trialed on amiodarone  but did not tolerate.  At clinic visit 03/04/2022 her losartan  torsemide  were transition to Entresto  and Bumex .  03/2022 metolazone  added once per week for volume control.  Echo 11/03/2022 with normal LVEF 60 to 65%, RV moderately reduced function, mildly elevated PASP, MV prosthesis functioning appropriately, mild aortic stenosis with mean gradient 11 mmHg.  Despite further reduction and discontinuation of metoprolol , bradycardia persisted.  Admission 12/25-12/27/24 with placement of dual-chamber PPM.   Her atrial lead is passive per EP team.  She saw Dr. Kitty 12/26/2023 most bothered by leg and back pain.  Device interrogation appropriate in clinic.  [name], Toprol  was increased to 100 mg at bedtime, however she felt more dyspnea and requested to reduce back to 50 mg dose.  Seen 01/03/2024 by Reche Finder, NP.  She reported a difficult time adjusting to PPM. She had recently fallen on the treadmill and her husband had it removed.  [name] to try to walk outside as the weather gets warmer.  Dyspnea resolved after resuming metoprolol  50 mg at bedtime.  Home BP 110-125/50s-60s, and HR in the 60s and 70s.  Recent weight loss, working on increasing protein with boost in the morning and trying to eat even when not hungry. Taking extra Bumex  on Monday Wednesday and Friday.  Mild bilateral ankle edema that is overall unchanged.  Based on lab results completed that day, she was advised to take Bumex  2 mg daily with no afternoon dosing for fear of over diuresing.  She was advised to take metolazone  as needed for weight gain of 5 pounds in 1 week, do not take more than once per week.  She was later changed to Lopressor  50 mg twice daily by Dr. Kitty.  Admission 6/28-03/27/24 for DOE, edema, possible pneumonia which was later ruled out. Excellent diuresis with IV Bumex  and metolazone  x 1. Advised to take Bumex  2 mg twice daily for 3 days then return to once daily dosing. One episode of AF RVR in the 130s with improvement with IV diltiazem  x 1.   Seen by me on 04/09/24 with multiple complaints, including leg swelling. Experiencing palpitations and generally has not felt well since paceaker placement  in December. Palpitations often occur in the mornings and can persist. She discontinued metoprolol  and bisoprolol  due to side effects including shortness of breath, wheezing, and nausea. Uses a breathing machine and inhaler for shortness of breath. Recently concerned about having pneumonia, with congestion that  has persisted. Concerned about bilateral leg swelling and skin tears. Swelling is often improved in the mornings but worsens throughout the day. She wears support hose but cannot use them on her left leg due to an open wound. Increased Bumex  dosing x 3 days did not improve swelling. Significant pain in her back between her shoulder blades.  Occasional chest pain. No dyspnea, orthopnea, PND, presyncope activity.  Seen by Charlies Arthur, PA on 04/12/24 for follow-up of PPM. Device interrogation reviewed with Dr. Waddell, with concerns for junctional tachycardia.  They discussed amiodarone  and ablation.  Dr. Waddell felt it prudent to pursue ablation to avoid further medications.  [name] to undergo ablation 05/30/2024.  Admission 7/20-7/30/25 for CHF exacerbation.  She had recent admission 6/28-03/27/24 with worsening shortness of breath, felt to be combination of acute on chronic HFpEF and possible CAP complicated by mild AKI and hyperkalemia. Cardiology was consulted. Felt to have nephrotic range of proteinuria explaing low albumin  and edema. Echo 04/24/24 revealed vigorous LV function 70 to 75%, mildly reduced RV, moderately elevated PASP, increased mitral gradients from 2023 with similar heart rate (60s); aortic valve gradients increased, possibly related to dynamic LV function.  No resting LVOT gradient. She underwent US  guided thoracentesis 7/29 with 1.1 L clear yellow fluid removed.  Discharged on SGLT2i, Bumex  as needed for weight gain 2-3 lbs, midodrine  for hypotension.  Emphasis placed on nutritional support   History of Present Illness Discussed the use of AI scribe software for clinical note transcription with the patient, who gave verbal consent to proceed.  History of Present Illness Catherine [name] is a very pleasant 89 year old female here today for hospital follow-up and accompanied by her husband. She continues to note significant weakness and an inability to walk far without having to stop  to rest. She lacks appetite which is contributing to her malnutrition. Her nutritional intake is minimal, including a scrambled egg, toast, Greek yogurt with fruit, and attempting to drink two Boosts daily. Her daughter, who is a Engineer, civil (consulting), created a medication sheet for her and prepares her pill bos. Patient thinks she is taking Bumex  1 mg daily for swelling, but her paper indicates it is as needed for weight gain > 2 lbs daily or > 5 lbs in a week. I emphasized the importance of her not taking Bumex  daily. Home weight is 118-119 lb and has not fluctuated beyond that since hospital discharge. She denies orthopnea and PND. She is concerned about her leg swelling which I re-educated on her kidney disease which is contributing to low albumin  and edema. Emphasized the importance that she increase her food intake. She is taking midodrine  twice a day for blood pressure support, and Eliquis  twice a day. She has reduced urine output. Recent hospitalization involved intravenous Lasix  and short-term oxygen  use. She experiences episodes of dyspnea, occasionally using a small breathing machine at home. She is not having any chest pain but continues to feel that SVT is contributing to her feeling poorly and wants to proceed with ablation on 9/3.   ROS: See HPI  Studies Reviewed       No results found for: LIPOA  Risk Assessment/Calculations  CHA2DS2-VASc Score = 6   This indicates  a 9.7% annual risk of stroke. The patient's score is based upon: CHF History: 1 HTN History: 1 Diabetes History: 0 Stroke History: 0 Vascular Disease History: 1 Age Score: 2 Gender Score: 1            Physical Exam VS:  BP (!) 102/52   Pulse 82   Ht 5' 3 (1.6 m)   Wt 123 lb 11.2 oz (56.1 kg)   SpO2 96%   BMI 21.91 kg/m    Wt Readings from Last 3 Encounters:  05/03/24 123 lb 11.2 oz (56.1 kg)  04/30/24 122 lb 14.4 oz (55.7 kg)  04/26/24 120 lb (54.4 kg)    GEN: Well nourished, well developed in no acute  distress NECK: No JVD; No carotid bruits CARDIAC: Irregular RR, no murmurs, rubs, gallops RESPIRATORY:  Clear to auscultation without rales, wheezing or rhonchi  ABDOMEN: Soft, non-tender, non-distended EXTREMITIES:  No edema; No deformity   Assessment & Plan Chronic HFpEF Dyspnea   Recent admission for volume overload, DOE. Echo 04/24/24 revealed LVEF 70-75%, indetermant diastolic parameters, mildly reduced RV function. Bilateral lower extremity swelling that does not improve with increased diuresis felt to be secondary to kidney disease and malnutrition. DOE has improved since hospital discharge and home weight is stable. No chest pain, orthopnea, or PND.  She is tolerating Jardiance  without any concerning side effects. Has been intolerant of multiple other agents for CHF.  - Continue daily weight -Continue Bumex  only as needed for weight gain - Continue albuterol  nebulizer as needed -Good nutrition and hydration emphasized -Continue leg elevation for swelling - Continue GDMT with empagliflozin    Atrial fibrillation on chronic anticoagulation Junctional tachycardia NSVT Pacemaker  Occasional episodes of tachy palpitations. Episodes of VT noted on remote transmission 6/30 and 7/1, > 20 beats. She has been unable to tolerate multiple beta-blockers in varying doses and does not want to add additional medications. Seen by EP 04/12/2024 with device interrogation that revealed junctional tachycardia felt to be contributing to feeling poorly.  [name] for SVT ablation on 05/30/2024. She denies bleeding concerns.  Continue Eliquis  2.5 mg twice daily which is appropriate dose (age, weight) for stroke prevention for CHA2DS2-VASc score of 6.  - Emphasized the importance of good nutrition in preparation for ablation - Will forward note to Dr. Waddell for his awareness  S/p MVR/Aortic stenosis Stable mitral valve function on echo completed 04/24/2024. No symptoms to suggest worsening valve  function.  -Continue to monitor clinically at this time.  Hypotension BP has been stable at home. No presyncope, syncope or falls.  -Continue midodrine  for blood pressures support  CAD She denies chest pain, dyspnea, or other symptoms concerning for angina.  No indication for further ischemic evaluation at this time.  Severe protein-calorie malnutrition Weakness    During admission, she was found to have nephrotic syndrome leading to proteinuria, decreased albumin  and subsequent leg edema. She admits to insufficient oral intake. She is feeling weak with daily activities. We discussed how this could affect her upcoming procedure.  I encouraged her to really focus on eating every few hours. -Emphasis placed on high-protein, high-calorie foods and low sugar, high protein nutritional supplements like Boost or Ensure  -Referral to nephrology placed   Chronic kidney disease with persistent edema   AKI during recent hospital admission. SCr 1.22 at time of discharge on 04/25/24. Felt to have nephrotic syndrome causing persistent edema. She reports reduced urine output. Bumex  is used as needed for weight gain. No concerning side  effects on Jardiance . -We will place referral to nephrology  -Continue Bumex  only as needed  for weight gain  -Continue Jardiance  once daily        Dispo: Keep your September appointment with Dr. Raford  Signed, Rosaline Bane, NP-C

## 2024-05-04 ENCOUNTER — Encounter (HOSPITAL_BASED_OUTPATIENT_CLINIC_OR_DEPARTMENT_OTHER): Payer: Self-pay

## 2024-05-07 ENCOUNTER — Telehealth: Payer: Self-pay

## 2024-05-07 LAB — MISC LABCORP TEST (SEND OUT): Labcorp test code: 9985

## 2024-05-07 NOTE — Patient Instructions (Signed)
 Visit Information  Thank you for taking time to visit with me today. Please don't hesitate to contact me if I can be of assistance to you before our next scheduled telephone appointment.  Our next appointment is by telephone on 05/15/2024  at 1pm  Following is a copy of your care plan:   Goals Addressed             This Visit's Progress    [facility] Transitions of Care (TOC) Care Plan       Problems:Reactivated goal  Discharged on 04/25/2024 Readmission for CHF.  04/26/2024  Patient reports that she continues to have a lot of swelling in her legs. Reports that she follows her low salt diet and takes her medications as prescribed. Reports she wears her compression hose ( only 1 leg due to wound). Reports that she keeps her legs elevated. 05/07/2024  Reports weight is 121-122 pounds.  Continues to elevate legs.  Bilateral Leg Swelling: 04/26/2024  Per EMR patient is expected to have refractory edema. 05/07/2024 Reports that she continues to have severe swelling.  Saw cardiology last week and was referred to nephrology.  Protein malnutrition:  04/26/2024  Patient reports that she has lost 35 pounds since January, due to decrease appetite after pacemaker insertion. 05/07/2024  Reports food makes her sick to her stomach. Drinking ensure and boost, fruit and yogurt.  Wound on leg- 04/26/2024 old wound on lower leg after hitting leg on dishwasher. Pending follow up with ortho in 1 week. 05/07/2024  Wound is healed per patient.   Goal:  Over the next 30 days, the patient will not experience hospital readmission  Interventions:  Transitions of Care:   Encouraged patient to continue to weigh daily, follow her low salt diet, elevate legs and wear her compression hose.   Continue to take medications as prescribed.  Provided a listening ear and offered support and reassurance Reviewed suggestion of drinking ensure between meals. Encouraged patient to eat more often.  Reviewed protein options. Encouraged patient  to keep eating and drinking  Reviewed how to reach medical attention 24/7. Reminded patient that MD has someone on call if she needs assistance Follow up with providers as suggested Reviewed low salt diet Provided patient with phone number and address for [facility]- referral placed on 05/03/2024    Patient Self Care Activities:  Attend all scheduled provider appointments Call pharmacy for medication refills 3-7 days in advance of running out of medications Call provider office for new concerns or questions  Notify RN Care Manager of TOC call rescheduling needs Participate in Transition of Care Program/Attend TOC scheduled calls Perform all self care activities independently  Perform IADL's (shopping, preparing meals, housekeeping, managing finances) independently Take medications as prescribed   Continue to weigh daily and follow your low salt diet. Continues to elevate your legs and wear your compression hose.  Call MD for weight gain.  Call MD's offices and schedule your follow up appointments. Call me if you are having difficulty with this. Continue to Observe wound for signs of infections now that they are healed. Try relaxation techniques to help with feeling overwhelmed.  Eat more protein Be careful not to fall on the blood thinners. If  you fall and hit your head- seek medical attention.  Placed a follow up call to [facility] if you do not hear from them in a few days. ( Phone number provided) Plan: Next appointment for transition of care with Alan Ee in 1 week 05/15/2024 at  1pm Patient provided with my contact information to call me if needed.         Patient verbalizes understanding of instructions and care plan provided today and agrees to view in MyChart. Active MyChart status and patient understanding of how to access instructions and care plan via MyChart confirmed with patient.     Telephone follow up appointment with care management team member scheduled  for:  05/15/2024  at 1pm  Please call the care guide team at [phone] if you need to cancel or reschedule your appointment.   Please call the Suicide and Crisis Lifeline: 988 call the [place]  National Suicide Prevention Lifeline: 234-500-8477 or TTY: 551-393-5519 TTY 508-436-1001) to talk to a trained counselor call 1-800-273-TALK (toll free, 24 hour hotline) call 911 if you are experiencing a Mental Health or Behavioral Health Crisis or need someone to talk to.  Alan Ee, RN, BSN, CEN [employer]- Transition of Care Team.  [facility] [phone]

## 2024-05-07 NOTE — Transitions of Care (Post Inpatient/ED Visit) (Signed)
 Transition of Care week 2  Visit Note  05/07/2024  Name: Catherine [name] MRN: [id number]          DOB: Sep 28, 1934  Situation: Patient enrolled in [facility] 30-day program. Visit completed with patient by telephone.   Background: Patient reports that she had an MD appointment when I was scheduled and apologized for missing my call.   Initial Transition Care Management Follow-up Telephone Call    Past Medical History:  Diagnosis Date   AKI (acute kidney injury) (HCC) 06/22/2022   Aortic stenosis, mild 07/26/2017   Arthritis    Asthma    last attack 02/2015   Cellulitis of left lower extremity 08/17/2016   Ulcer associated with severe venous insufficiency   Chronic anticoagulation    Chronic diastolic CHF (congestive heart failure) (HCC)    Chronic kidney disease    RIGHT MANY KIDNEY INFECTIONS AND STONES   COPD (chronic obstructive pulmonary disease) (HCC)    Coronary artery disease    Dizziness    H/O: rheumatic fever    Hypertension    PAF (paroxysmal atrial fibrillation) (HCC)    PONV (postoperative nausea and vomiting)    ' SOMETIMES', BUT NOT ALWAYS   Primary hypertension    RHEUMATIC MITRAL STENOSIS 01/05/2008   Qualifier: Diagnosis of   By: Neysa MD, Clinton D        S/P Maze operation for atrial fibrillation 10/14/2016   Complete bilateral atrial lesion set using cryothermy and bipolar radiofrequency ablation - atrial appendage was not treated due to previous surgical procedure (open mitral commissurotomy)   S/P mitral valve replacement with bioprosthetic valve 10/14/2016   29 mm Medtronic Mosaic porcine bioprosthetic tissue valve   Sciatica 08/29/2023   Tricuspid regurgitation 07/14/2021   UTI (urinary tract infection)    Valvular heart disease    Has mitral stenosis with prior mitral commissurotomy in 1970    Assessment: Patient reports that she had her follow up with cardiology, ENT and PCP. Reports that she continues to have severe swelling of the lower  extremities. Reports when she elevates her legs they go down but swell again when she gets up.  Reports weight is unchanged. Reports that she is trying to eat more but does not have an appetite.  Patient Reported Symptoms: Cognitive Cognitive Status: Able to follow simple commands, Alert and oriented to person, place, and time, Normal speech and language skills      Neurological Neurological Review of Symptoms: No symptoms reported    HEENT HEENT Symptoms Reported: No symptoms reported      Cardiovascular Cardiovascular Symptoms Reported: Swelling in legs or feet Weight: 122 lb (55.3 kg) (home monitoring)  Respiratory Respiratory Symptoms Reported: No symptoms reported    Endocrine Endocrine Symptoms Reported: No symptoms reported Is patient diabetic?: No    Gastrointestinal Gastrointestinal Symptoms Reported: Nausea Additional Gastrointestinal Details: reports nausea when she trying to eat. Reports drinking more boost and ensure to get her protein. Gastrointestinal Management Strategies: Medication therapy    Genitourinary Genitourinary Symptoms Reported: No symptoms reported    Integumentary Integumentary Symptoms Reported: Other Other Integumentary Symptoms: Reports wound are healed Additional Integumentary Details: Reviewed being careful not to bump her leg or fall    Musculoskeletal Musculoskelatal Symptoms Reviewed: Back pain Other Musculoskeletal Symptoms: chronic back pain Musculoskeletal Management Strategies: Medication therapy Musculoskeletal Self-Management Outcome: 4 (good)      Psychosocial Other Psychosocial Conditions: continues to feel anxious. But sound more calm on the phone today.  There were no vitals filed for this visit.  Medications Reviewed Today     Reviewed by Rumalda Alan PENNER, RN (Registered Nurse) on 05/07/24 at 1301  Med List Status: <None>   Medication Order Taking? Sig Documenting Provider Last Dose Status Informant  acetaminophen   (TYLENOL ) 500 MG tablet [id number]  Take 500 mg by mouth every 6 (six) hours as needed for mild pain (pain score 1-3). [provider]  Active Self, Pharmacy Records           Med Note (COFFELL, JON HERO   Sun Mar 25, 2024 11:26 AM)    albuterol  (PROVENTIL ) (2.5 MG/3ML) 0.083% nebulizer solution [id number]  Take 3 mLs (2.5 mg total) by nebulization every 6 (six) hours as needed (during COPD Exacerbation). Hope Almarie ORN, NP  Active Self, Pharmacy Records  albuterol  (VENTOLIN  HFA) [phone] Base) MCG/ACT inhaler [id number]  INHALE 2 PUFFS INTO THE LUNGS EVERY 4 HOURS AS NEEDED FOR AHEEZING OR SHORTNESS OF BREATH Parrett, Tammy S, NP  Active Self, Pharmacy Records  apixaban  (ELIQUIS ) 2.5 MG TABS tablet [id number]  TAKE ONE TABLET TWICE DAILY (START 09/18/23 OR AS DIRECTED) Micheal Wolm ORN, MD  Active Self, Pharmacy Records  bumetanide  (BUMEX ) 1 MG tablet [id number]  Take 1 tablet (1 mg total) by mouth daily as needed (for worsening lower extremity swelling, weight gain 2 to 3 lbs in 24 hrs or 5 lbs in 7 days). Arrien, Elidia Sieving, MD  Active   cholecalciferol (VITAMIN D3) 25 MCG (1000 UNIT) tablet [id number]  Take 1,000 Units by mouth daily. [provider]  Active Self, Pharmacy Records  diazepam  (VALIUM ) 5 MG tablet [id number]  TAKE ONE TABLET BY MOUTH AT BEDTIME Micheal Wolm ORN, MD  Active Self, Pharmacy Records  empagliflozin  (JARDIANCE ) 10 MG TABS tablet [id number]  Take 1 tablet (10 mg total) by mouth daily. Arrien, Elidia Sieving, MD  Active   estradiol  (ESTRACE ) 0.1 MG/GM vaginal cream [id number]  Place 1 Applicatorful vaginally See admin instructions. Apply a small amount vaginally three nights a week on Monday, Wednesday and Friday. [provider]  Active Self, Pharmacy Records  feeding supplement (ENSURE PLUS HIGH PROTEIN) LIQD [id number]  Take 237 mLs by mouth 2 (two) times daily between meals. Arrien, Mauricio Daniel, MD  Active   fluticasone  (FLONASE ) 50 MCG/ACT  nasal spray [id number]  Place 1 spray into both nostrils daily as needed for allergies or rhinitis. Shellia Oh, MD  Active Self, Pharmacy Records  guaiFENesin  (MUCINEX ) 600 MG 12 hr tablet [id number]  Take 1 tablet (600 mg total) by mouth 2 (two) times daily as needed for to loosen phlegm or cough. Arrien, Mauricio Daniel, MD  Active   HYDROcodone -acetaminophen  (NORCO/VICODIN) 5-325 MG tablet [id number]  TAKE ONE TABLET EVERY 8 HOURS AS NEEDED FOR PAIN Burchette, Wolm ORN, MD  Active Self, Pharmacy Records  midodrine  (PROAMATINE ) 5 MG tablet [id number]  Take 1 tablet (5 mg total) by mouth 2 (two) times daily with a meal. Arrien, Elidia Sieving, MD  Active   Multiple Vitamin (MULTIVITAMIN WITH MINERALS) TABS tablet [id number]  Take 1 tablet by mouth daily. Arrien, Mauricio Daniel, MD  Active   mupirocin  ointment (BACTROBAN ) 2 % [id number]  APPLY TOPICALLY ONCE A DAY Burchette, Wolm ORN, MD  Active Self, Pharmacy Records  ondansetron  (ZOFRAN ) 4 MG tablet [id number]  Take 1 tablet (4 mg total) by mouth every 8 (eight) hours as needed for nausea or vomiting. Arrien, Elidia Sieving, MD  Active  Goals       Exercise 3x per week (30 min per time)      Increase physical activity (pt-stated)      LDL CALC < 70      [facility] Transitions of Care (TOC) Care Plan      Problems:Reactivated goal  Discharged on 04/25/2024 Readmission for CHF.  04/26/2024  Patient reports that she continues to have a lot of swelling in her legs. Reports that she follows her low salt diet and takes her medications as prescribed. Reports she wears her compression hose ( only 1 leg due to wound). Reports that she keeps her legs elevated. 05/07/2024  Reports weight is 121-122 pounds.  Continues to elevate legs.  Bilateral Leg Swelling: 04/26/2024  Per EMR patient is expected to have refractory edema. 05/07/2024 Reports that she continues to have severe swelling.  Saw cardiology last week and was referred to nephrology.  Protein  malnutrition:  04/26/2024  Patient reports that she has lost 35 pounds since January, due to decrease appetite after pacemaker insertion. 05/07/2024  Reports food makes her sick to her stomach. Drinking ensure and boost, fruit and yogurt.  Wound on leg- 04/26/2024 old wound on lower leg after hitting leg on dishwasher. Pending follow up with ortho in 1 week. 05/07/2024  Wound is healed per patient.   Goal:  Over the next 30 days, the patient will not experience hospital readmission  Interventions:  Transitions of Care:   Encouraged patient to continue to weigh daily, follow her low salt diet, elevate legs and wear her compression hose.   Continue to take medications as prescribed.  Provided a listening ear and offered support and reassurance Reviewed suggestion of drinking ensure between meals. Encouraged patient to eat more often.  Reviewed protein options. Encouraged patient to keep eating and drinking  Reviewed how to reach medical attention 24/7. Reminded patient that MD has someone on call if she needs assistance Follow up with providers as suggested Reviewed low salt diet Provided patient with phone number and address for [facility]- referral placed on 05/03/2024    Patient Self Care Activities:  Attend all scheduled provider appointments Call pharmacy for medication refills 3-7 days in advance of running out of medications Call provider office for new concerns or questions  Notify RN Care Manager of TOC call rescheduling needs Participate in Transition of Care Program/Attend TOC scheduled calls Perform all self care activities independently  Perform IADL's (shopping, preparing meals, housekeeping, managing finances) independently Take medications as prescribed   Continue to weigh daily and follow your low salt diet. Continues to elevate your legs and wear your compression hose.  Call MD for weight gain.  Call MD's offices and schedule your follow up appointments. Call me if you  are having difficulty with this. Continue to Observe wound for signs of infections now that they are healed. Try relaxation techniques to help with feeling overwhelmed.  Eat more protein Be careful not to fall on the blood thinners. If  you fall and hit your head- seek medical attention.  Placed a follow up call to [facility] if you do not hear from them in a few days. ( Phone number provided) Plan: Next appointment for transition of care with Alan Ee in 1 week 05/15/2024 at 1pm Patient provided with my contact information to call me if needed.         Recommendation:   Continue Current Plan of Care  Follow Up Plan:   Telephone follow up appointment  date/time:  05/15/2024  at 1pm  Alan Ee, RN, BSN, CEN Population Health- Transition of Care Team.  [facility] [phone]

## 2024-05-08 ENCOUNTER — Telehealth (HOSPITAL_BASED_OUTPATIENT_CLINIC_OR_DEPARTMENT_OTHER): Payer: Self-pay | Admitting: Cardiovascular Disease

## 2024-05-08 ENCOUNTER — Other Ambulatory Visit (HOSPITAL_COMMUNITY): Payer: Self-pay

## 2024-05-08 ENCOUNTER — Other Ambulatory Visit: Payer: Self-pay | Admitting: Cardiovascular Disease

## 2024-05-08 NOTE — Telephone Encounter (Signed)
*  STAT* If patient is at the pharmacy, call can be transferred to refill team.   1. Which medications need to be refilled? (please list name of each medication and dose if known)   ondansetron  (ZOFRAN ) 4 MG tablet   2. Would you like to learn more about the convenience, safety, & potential cost savings by using the [facility]?   3. Are you open to using the [facility] (Type [facility]. ).  4. Which pharmacy/location (including street and city if local pharmacy) is medication to be sent to?  [facility] [place], [place] - [address]   5. Do they need a 30 day or 90 day supply?   Caller [employer]) thinks patient is close to being out of this medication.

## 2024-05-08 NOTE — Telephone Encounter (Signed)
*  STAT* If patient is at the pharmacy, call can be transferred to refill team.   1. Which medications need to be refilled? (please list name of each medication and dose if known) bumetanide  (BUMEX ) 1 MG tablet   empagliflozin  (JARDIANCE ) 10 MG TABS tablet   midodrine  (PROAMATINE ) 5 MG tablet   ondansetron  (ZOFRAN ) 4 MG tablet   2. Which pharmacy/location (including street and city if local pharmacy) is medication to be sent to? [facility]   3. Do they need a 30 day or 90 day supply? 90

## 2024-05-09 ENCOUNTER — Telehealth: Payer: Self-pay | Admitting: Cardiovascular Disease

## 2024-05-09 ENCOUNTER — Other Ambulatory Visit (HOSPITAL_COMMUNITY): Payer: Self-pay

## 2024-05-09 ENCOUNTER — Ambulatory Visit: Payer: Self-pay | Admitting: Cardiology

## 2024-05-09 MED ORDER — BUMETANIDE 1 MG PO TABS
1.0000 mg | ORAL_TABLET | Freq: Every day | ORAL | 3 refills | Status: DC | PRN
Start: 2024-05-09 — End: 2024-05-24

## 2024-05-09 MED ORDER — EMPAGLIFLOZIN 10 MG PO TABS
10.0000 mg | ORAL_TABLET | Freq: Every day | ORAL | 3 refills | Status: DC
Start: 2024-05-09 — End: 2024-06-01

## 2024-05-09 MED ORDER — MIDODRINE HCL 5 MG PO TABS: 5.0000 mg | ORAL_TABLET | Freq: Two times a day (BID) | ORAL | 3 refills | Status: DC

## 2024-05-09 NOTE — Telephone Encounter (Signed)
 Pt is aware that we can refill everything except the Zoftan.  She was thankful for the call back.

## 2024-05-09 NOTE — Telephone Encounter (Signed)
*  STAT* If patient is at the pharmacy, call can be transferred to refill team.   1. Which medications need to be refilled? (please list name of each medication and dose if known) bumetanide  (BUMEX ) 1 MG tablet  midodrine  (PROAMATINE ) 5 MG tablet  ondansetron  (ZOFRAN ) 4 MG tablet  empagliflozin  (JARDIANCE ) 10 MG TABS tablet   **Filled in hospital. Ok to fill??**  2. Which pharmacy/location (including street and city if local pharmacy) is medication to be sent to? [facility] [place], [place] - [address]    3. Do they need a 30 day or 90 day supply? 90

## 2024-05-09 NOTE — Telephone Encounter (Signed)
 Contact pcp office for refill

## 2024-05-09 NOTE — Telephone Encounter (Signed)
 For Review-ok to fill??

## 2024-05-10 ENCOUNTER — Other Ambulatory Visit (HOSPITAL_COMMUNITY): Payer: Self-pay

## 2024-05-11 ENCOUNTER — Other Ambulatory Visit: Payer: Self-pay | Admitting: Family Medicine

## 2024-05-11 NOTE — Telephone Encounter (Signed)
 When will Dr. Micheal be back in the office?

## 2024-05-14 ENCOUNTER — Other Ambulatory Visit: Payer: Self-pay

## 2024-05-14 ENCOUNTER — Emergency Department (HOSPITAL_BASED_OUTPATIENT_CLINIC_OR_DEPARTMENT_OTHER): Admitting: Radiology

## 2024-05-14 ENCOUNTER — Encounter (HOSPITAL_BASED_OUTPATIENT_CLINIC_OR_DEPARTMENT_OTHER): Payer: Self-pay | Admitting: Emergency Medicine

## 2024-05-14 ENCOUNTER — Inpatient Hospital Stay (HOSPITAL_BASED_OUTPATIENT_CLINIC_OR_DEPARTMENT_OTHER)
Admission: EM | Admit: 2024-05-14 | Discharge: 2024-05-24 | DRG: 286 | Disposition: A | Discharge status: Home Health | Attending: Family Medicine | Admitting: Family Medicine

## 2024-05-14 DIAGNOSIS — Z7984 Long term (current) use of oral hypoglycemic drugs: Secondary | ICD-10-CM

## 2024-05-14 DIAGNOSIS — N049 Nephrotic syndrome with unspecified morphologic changes: Secondary | ICD-10-CM | POA: Diagnosis not present

## 2024-05-14 DIAGNOSIS — I509 Heart failure, unspecified: Secondary | ICD-10-CM

## 2024-05-14 DIAGNOSIS — I2489 Other forms of acute ischemic heart disease: Secondary | ICD-10-CM | POA: Diagnosis not present

## 2024-05-14 DIAGNOSIS — R059 Cough, unspecified: Secondary | ICD-10-CM | POA: Diagnosis not present

## 2024-05-14 DIAGNOSIS — Z6821 Body mass index (BMI) 21.0-21.9, adult: Secondary | ICD-10-CM

## 2024-05-14 DIAGNOSIS — M1990 Unspecified osteoarthritis, unspecified site: Secondary | ICD-10-CM | POA: Diagnosis present

## 2024-05-14 DIAGNOSIS — N1831 Chronic kidney disease, stage 3a: Secondary | ICD-10-CM | POA: Diagnosis not present

## 2024-05-14 DIAGNOSIS — E785 Hyperlipidemia, unspecified: Secondary | ICD-10-CM | POA: Diagnosis present

## 2024-05-14 DIAGNOSIS — D61818 Other pancytopenia: Secondary | ICD-10-CM | POA: Diagnosis not present

## 2024-05-14 DIAGNOSIS — I099 Rheumatic heart disease, unspecified: Secondary | ICD-10-CM | POA: Diagnosis not present

## 2024-05-14 DIAGNOSIS — D892 Hypergammaglobulinemia, unspecified: Secondary | ICD-10-CM | POA: Diagnosis not present

## 2024-05-14 DIAGNOSIS — Z881 Allergy status to other antibiotic agents status: Secondary | ICD-10-CM

## 2024-05-14 DIAGNOSIS — M5430 Sciatica, unspecified side: Secondary | ICD-10-CM | POA: Diagnosis present

## 2024-05-14 DIAGNOSIS — E1140 Type 2 diabetes mellitus with diabetic neuropathy, unspecified: Secondary | ICD-10-CM | POA: Diagnosis present

## 2024-05-14 DIAGNOSIS — I480 Paroxysmal atrial fibrillation: Secondary | ICD-10-CM | POA: Diagnosis present

## 2024-05-14 DIAGNOSIS — Z953 Presence of xenogenic heart valve: Secondary | ICD-10-CM

## 2024-05-14 DIAGNOSIS — J449 Chronic obstructive pulmonary disease, unspecified: Secondary | ICD-10-CM | POA: Diagnosis not present

## 2024-05-14 DIAGNOSIS — D6489 Other specified anemias: Secondary | ICD-10-CM | POA: Diagnosis not present

## 2024-05-14 DIAGNOSIS — E8809 Other disorders of plasma-protein metabolism, not elsewhere classified: Secondary | ICD-10-CM | POA: Diagnosis not present

## 2024-05-14 DIAGNOSIS — Z7901 Long term (current) use of anticoagulants: Secondary | ICD-10-CM

## 2024-05-14 DIAGNOSIS — Z8616 Personal history of COVID-19: Secondary | ICD-10-CM | POA: Diagnosis not present

## 2024-05-14 DIAGNOSIS — E859 Amyloidosis, unspecified: Secondary | ICD-10-CM | POA: Diagnosis not present

## 2024-05-14 DIAGNOSIS — J4489 Other specified chronic obstructive pulmonary disease: Secondary | ICD-10-CM | POA: Diagnosis present

## 2024-05-14 DIAGNOSIS — R0602 Shortness of breath: Secondary | ICD-10-CM | POA: Diagnosis not present

## 2024-05-14 DIAGNOSIS — D759 Disease of blood and blood-forming organs, unspecified: Secondary | ICD-10-CM | POA: Diagnosis not present

## 2024-05-14 DIAGNOSIS — J9601 Acute respiratory failure with hypoxia: Secondary | ICD-10-CM

## 2024-05-14 DIAGNOSIS — I5033 Acute on chronic diastolic (congestive) heart failure: Secondary | ICD-10-CM | POA: Diagnosis present

## 2024-05-14 DIAGNOSIS — Z951 Presence of aortocoronary bypass graft: Secondary | ICD-10-CM

## 2024-05-14 DIAGNOSIS — I2510 Atherosclerotic heart disease of native coronary artery without angina pectoris: Secondary | ICD-10-CM | POA: Diagnosis not present

## 2024-05-14 DIAGNOSIS — R600 Localized edema: Secondary | ICD-10-CM | POA: Diagnosis not present

## 2024-05-14 DIAGNOSIS — E1122 Type 2 diabetes mellitus with diabetic chronic kidney disease: Secondary | ICD-10-CM | POA: Diagnosis not present

## 2024-05-14 DIAGNOSIS — I959 Hypotension, unspecified: Secondary | ICD-10-CM | POA: Diagnosis not present

## 2024-05-14 DIAGNOSIS — Z950 Presence of cardiac pacemaker: Secondary | ICD-10-CM

## 2024-05-14 DIAGNOSIS — Z1152 Encounter for screening for COVID-19: Secondary | ICD-10-CM | POA: Diagnosis not present

## 2024-05-14 DIAGNOSIS — I4891 Unspecified atrial fibrillation: Secondary | ICD-10-CM | POA: Diagnosis present

## 2024-05-14 DIAGNOSIS — I130 Hypertensive heart and chronic kidney disease with heart failure and stage 1 through stage 4 chronic kidney disease, or unspecified chronic kidney disease: Principal | ICD-10-CM | POA: Diagnosis present

## 2024-05-14 DIAGNOSIS — E43 Unspecified severe protein-calorie malnutrition: Secondary | ICD-10-CM | POA: Diagnosis not present

## 2024-05-14 DIAGNOSIS — R801 Persistent proteinuria, unspecified: Secondary | ICD-10-CM | POA: Diagnosis not present

## 2024-05-14 DIAGNOSIS — Z9049 Acquired absence of other specified parts of digestive tract: Secondary | ICD-10-CM

## 2024-05-14 DIAGNOSIS — Z87891 Personal history of nicotine dependence: Secondary | ICD-10-CM

## 2024-05-14 DIAGNOSIS — I10 Essential (primary) hypertension: Secondary | ICD-10-CM | POA: Diagnosis present

## 2024-05-14 DIAGNOSIS — Z90710 Acquired absence of both cervix and uterus: Secondary | ICD-10-CM

## 2024-05-14 DIAGNOSIS — D472 Monoclonal gammopathy: Secondary | ICD-10-CM | POA: Diagnosis not present

## 2024-05-14 DIAGNOSIS — C9000 Multiple myeloma not having achieved remission: Secondary | ICD-10-CM | POA: Diagnosis not present

## 2024-05-14 DIAGNOSIS — Z806 Family history of leukemia: Secondary | ICD-10-CM

## 2024-05-14 DIAGNOSIS — R0989 Other specified symptoms and signs involving the circulatory and respiratory systems: Secondary | ICD-10-CM | POA: Diagnosis not present

## 2024-05-14 DIAGNOSIS — J90 Pleural effusion, not elsewhere classified: Secondary | ICD-10-CM | POA: Diagnosis not present

## 2024-05-14 DIAGNOSIS — E876 Hypokalemia: Secondary | ICD-10-CM | POA: Diagnosis not present

## 2024-05-14 DIAGNOSIS — I272 Pulmonary hypertension, unspecified: Secondary | ICD-10-CM | POA: Diagnosis present

## 2024-05-14 DIAGNOSIS — Z888 Allergy status to other drugs, medicaments and biological substances status: Secondary | ICD-10-CM

## 2024-05-14 DIAGNOSIS — Z66 Do not resuscitate: Secondary | ICD-10-CM | POA: Diagnosis present

## 2024-05-14 DIAGNOSIS — I700 Atherosclerosis of aorta: Secondary | ICD-10-CM | POA: Diagnosis not present

## 2024-05-14 DIAGNOSIS — I495 Sick sinus syndrome: Secondary | ICD-10-CM | POA: Diagnosis not present

## 2024-05-14 DIAGNOSIS — R768 Other specified abnormal immunological findings in serum: Secondary | ICD-10-CM | POA: Diagnosis not present

## 2024-05-14 DIAGNOSIS — I517 Cardiomegaly: Secondary | ICD-10-CM | POA: Diagnosis not present

## 2024-05-14 DIAGNOSIS — Z79899 Other long term (current) drug therapy: Secondary | ICD-10-CM

## 2024-05-14 DIAGNOSIS — M7989 Other specified soft tissue disorders: Secondary | ICD-10-CM | POA: Diagnosis not present

## 2024-05-14 DIAGNOSIS — D649 Anemia, unspecified: Secondary | ICD-10-CM | POA: Diagnosis not present

## 2024-05-14 DIAGNOSIS — D696 Thrombocytopenia, unspecified: Secondary | ICD-10-CM | POA: Diagnosis not present

## 2024-05-14 DIAGNOSIS — I9589 Other hypotension: Secondary | ICD-10-CM | POA: Diagnosis present

## 2024-05-14 DIAGNOSIS — K5900 Constipation, unspecified: Secondary | ICD-10-CM | POA: Diagnosis present

## 2024-05-14 DIAGNOSIS — Z87440 Personal history of urinary (tract) infections: Secondary | ICD-10-CM

## 2024-05-14 LAB — CBC WITH DIFFERENTIAL/PLATELET
Abs Immature Granulocytes: 0.03 K/uL (ref 0.00–0.07)
Basophils Absolute: 0 K/uL (ref 0.0–0.1)
Basophils Relative: 0 %
Eosinophils Absolute: 0.1 K/uL (ref 0.0–0.5)
Eosinophils Relative: 1 %
HCT: 35 % — ABNORMAL LOW (ref 36.0–46.0)
Hemoglobin: 11.6 g/dL — ABNORMAL LOW (ref 12.0–15.0)
Immature Granulocytes: 0 %
Lymphocytes Relative: 12 %
Lymphs Abs: 1.1 K/uL (ref 0.7–4.0)
MCH: 30.5 pg (ref 26.0–34.0)
MCHC: 33.1 g/dL (ref 30.0–36.0)
MCV: 92.1 fL (ref 80.0–100.0)
Monocytes Absolute: 0.9 K/uL (ref 0.1–1.0)
Monocytes Relative: 9 %
Neutro Abs: 7.1 K/uL (ref 1.7–7.7)
Neutrophils Relative %: 78 %
Platelets: 153 K/uL (ref 150–400)
RBC: 3.8 MIL/uL — ABNORMAL LOW (ref 3.87–5.11)
RDW: 14 % (ref 11.5–15.5)
WBC: 9.1 K/uL (ref 4.0–10.5)
nRBC: 0 % (ref 0.0–0.2)

## 2024-05-14 LAB — COMPREHENSIVE METABOLIC PANEL WITH GFR
ALT: 13 U/L (ref 0–44)
AST: 33 U/L (ref 15–41)
Albumin: 2.8 g/dL — ABNORMAL LOW (ref 3.5–5.0)
Alkaline Phosphatase: 84 U/L (ref 38–126)
Anion gap: 10 (ref 5–15)
BUN: 33 mg/dL — ABNORMAL HIGH (ref 8–23)
CO2: 30 mmol/L (ref 22–32)
Calcium: 10 mg/dL (ref 8.9–10.3)
Chloride: 95 mmol/L — ABNORMAL LOW (ref 98–111)
Creatinine, Ser: 1.34 mg/dL — ABNORMAL HIGH (ref 0.44–1.00)
GFR, Estimated: 38 mL/min — ABNORMAL LOW (ref >60)
Glucose, Bld: 118 mg/dL — ABNORMAL HIGH (ref 70–99)
Potassium: 4.3 mmol/L (ref 3.5–5.1)
Sodium: 135 mmol/L (ref 135–145)
Total Bilirubin: 0.3 mg/dL (ref 0.0–1.2)
Total Protein: 5.4 g/dL — ABNORMAL LOW (ref 6.5–8.1)

## 2024-05-14 LAB — RESP PANEL BY RT-PCR (RSV, FLU A&B, COVID)  RVPGX2
Influenza A by PCR: NEGATIVE
Influenza B by PCR: NEGATIVE
Resp Syncytial Virus by PCR: NEGATIVE
SARS Coronavirus 2 by RT PCR: NEGATIVE

## 2024-05-14 LAB — TSH: TSH: 5.039 u[IU]/mL — ABNORMAL HIGH (ref 0.350–4.500)

## 2024-05-14 LAB — LACTIC ACID, PLASMA
Lactic Acid, Venous: 1.7 mmol/L (ref 0.5–1.9)
Lactic Acid, Venous: 1 mmol/L (ref 0.5–1.9)

## 2024-05-14 LAB — TROPONIN I (HIGH SENSITIVITY): Troponin I (High Sensitivity): 159 ng/L (ref <18)

## 2024-05-14 LAB — TROPONIN T, HIGH SENSITIVITY
Troponin T High Sensitivity: 140 ng/L (ref 0–19)
Troponin T High Sensitivity: 127 ng/L (ref 0–19)

## 2024-05-14 LAB — PRO BRAIN NATRIURETIC PEPTIDE: Pro Brain Natriuretic Peptide: 15910 pg/mL — ABNORMAL HIGH (ref <300.0)

## 2024-05-14 MED ORDER — ALBUTEROL SULFATE (2.5 MG/3ML) 0.083% IN NEBU
2.5000 mg | INHALATION_SOLUTION | RESPIRATORY_TRACT | Status: DC | PRN
Start: 2024-05-14 — End: 2024-05-15

## 2024-05-14 MED ORDER — ONDANSETRON HCL 4 MG PO TABS
4.0000 mg | ORAL_TABLET | Freq: Four times a day (QID) | ORAL | Status: DC | PRN
Start: 2024-05-14 — End: 2024-05-24

## 2024-05-14 MED ORDER — HYDROCODONE-ACETAMINOPHEN 5-325 MG PO TABS
1.0000 | ORAL_TABLET | ORAL | Status: DC | PRN
  Administered 2024-05-14 – 2024-05-20 (×6): 1 via ORAL
  Administered 2024-05-22: 2 via ORAL
  Filled 2024-05-14: qty 2
  Filled 2024-05-14 (×5): qty 1
  Filled 2024-05-14: qty 2

## 2024-05-14 MED ORDER — ACETAMINOPHEN 650 MG RE SUPP: 650.0000 mg | Freq: Four times a day (QID) | RECTAL | Status: DC | PRN

## 2024-05-14 MED ORDER — ALBUTEROL SULFATE (2.5 MG/3ML) 0.083% IN NEBU
2.5000 mg | INHALATION_SOLUTION | Freq: Four times a day (QID) | RESPIRATORY_TRACT | Status: DC | PRN
  Administered 2024-05-17 – 2024-05-19 (×5): 2.5 mg via RESPIRATORY_TRACT
  Filled 2024-05-14 (×5): qty 3

## 2024-05-14 MED ORDER — GUAIFENESIN ER 600 MG PO TB12
600.0000 mg | ORAL_TABLET | Freq: Two times a day (BID) | ORAL | Status: DC | PRN
  Administered 2024-05-17: 600 mg via ORAL
  Filled 2024-05-14: qty 1

## 2024-05-14 MED ORDER — SENNA 8.6 MG PO TABS
1.0000 | ORAL_TABLET | Freq: Two times a day (BID) | ORAL | Status: DC
  Administered 2024-05-14 – 2024-05-24 (×18): 8.6 mg via ORAL
  Filled 2024-05-14 (×18): qty 1

## 2024-05-14 MED ORDER — GUAIFENESIN ER 600 MG PO TB12
600.0000 mg | ORAL_TABLET | Freq: Two times a day (BID) | ORAL | Status: DC
  Administered 2024-05-14 – 2024-05-24 (×18): 600 mg via ORAL
  Filled 2024-05-14 (×19): qty 1

## 2024-05-14 MED ORDER — FUROSEMIDE 10 MG/ML IJ SOLN
80.0000 mg | Freq: Two times a day (BID) | INTRAMUSCULAR | Status: DC
Start: 2024-05-14 — End: 2024-05-18
  Administered 2024-05-14 – 2024-05-17 (×7): 80 mg via INTRAVENOUS
  Filled 2024-05-14 (×7): qty 8

## 2024-05-14 MED ORDER — FENTANYL CITRATE PF 50 MCG/ML IJ SOSY: 12.5000 ug | PREFILLED_SYRINGE | INTRAMUSCULAR | Status: DC | PRN

## 2024-05-14 MED ORDER — EMPAGLIFLOZIN 10 MG PO TABS
10.0000 mg | ORAL_TABLET | Freq: Every morning | ORAL | Status: DC
  Administered 2024-05-15 – 2024-05-24 (×9): 10 mg via ORAL
  Filled 2024-05-14 (×13): qty 1

## 2024-05-14 MED ORDER — DOCUSATE SODIUM 100 MG PO CAPS
100.0000 mg | ORAL_CAPSULE | Freq: Two times a day (BID) | ORAL | Status: DC
  Administered 2024-05-14 – 2024-05-22 (×14): 100 mg via ORAL
  Filled 2024-05-14 (×14): qty 1

## 2024-05-14 MED ORDER — IPRATROPIUM-ALBUTEROL 0.5-2.5 (3) MG/3ML IN SOLN
3.0000 mL | Freq: Four times a day (QID) | RESPIRATORY_TRACT | Status: DC
  Administered 2024-05-14: 3 mL via RESPIRATORY_TRACT

## 2024-05-14 MED ORDER — DIAZEPAM 5 MG PO TABS
5.0000 mg | ORAL_TABLET | Freq: Every day | ORAL | Status: DC
  Administered 2024-05-14 – 2024-05-23 (×10): 5 mg via ORAL
  Filled 2024-05-14 (×11): qty 1

## 2024-05-14 MED ORDER — ONDANSETRON HCL 4 MG/2ML IJ SOLN
4.0000 mg | Freq: Four times a day (QID) | INTRAMUSCULAR | Status: DC | PRN
Start: 2024-05-14 — End: 2024-05-24
  Administered 2024-05-16: 4 mg via INTRAVENOUS
  Filled 2024-05-14: qty 2

## 2024-05-14 MED ORDER — SODIUM CHLORIDE 0.9 % IV SOLN: 250.0000 mL | INTRAVENOUS | Status: AC | PRN

## 2024-05-14 MED ORDER — IPRATROPIUM-ALBUTEROL 0.5-2.5 (3) MG/3ML IN SOLN
3.0000 mL | Freq: Two times a day (BID) | RESPIRATORY_TRACT | Status: DC
  Administered 2024-05-15 (×2): 3 mL via RESPIRATORY_TRACT
  Filled 2024-05-14 (×2): qty 3

## 2024-05-14 MED ORDER — ACETAMINOPHEN 325 MG PO TABS
650.0000 mg | ORAL_TABLET | Freq: Four times a day (QID) | ORAL | Status: DC | PRN
  Administered 2024-05-15 – 2024-05-16 (×3): 650 mg via ORAL
  Filled 2024-05-14 (×3): qty 2

## 2024-05-14 MED ORDER — APIXABAN 5 MG PO TABS
5.0000 mg | ORAL_TABLET | Freq: Two times a day (BID) | ORAL | Status: DC
  Administered 2024-05-14 – 2024-05-15 (×2): 5 mg via ORAL
  Filled 2024-05-14 (×2): qty 1

## 2024-05-14 MED ORDER — ENSURE PLUS HIGH PROTEIN PO LIQD
237.0000 mL | Freq: Two times a day (BID) | ORAL | Status: DC
  Administered 2024-05-15 – 2024-05-24 (×14): 237 mL via ORAL

## 2024-05-14 MED ORDER — POLYETHYLENE GLYCOL 3350 17 G PO PACK: 17.0000 g | PACK | Freq: Every day | ORAL | Status: DC | PRN

## 2024-05-14 MED ORDER — FUROSEMIDE 10 MG/ML IJ SOLN: 80.0000 mg | Freq: Two times a day (BID) | INTRAMUSCULAR | Status: DC

## 2024-05-14 MED ORDER — ORAL CARE MOUTH RINSE: 15.0000 mL | OROMUCOSAL | Status: DC | PRN

## 2024-05-14 MED ORDER — SODIUM CHLORIDE 0.9% FLUSH
3.0000 mL | Freq: Two times a day (BID) | INTRAVENOUS | Status: DC
  Administered 2024-05-14 – 2024-05-22 (×13): 3 mL via INTRAVENOUS

## 2024-05-14 MED ORDER — BISACODYL 10 MG RE SUPP
10.0000 mg | Freq: Every day | RECTAL | Status: DC | PRN
  Administered 2024-05-15 – 2024-05-16 (×2): 10 mg via RECTAL
  Filled 2024-05-14 (×2): qty 1

## 2024-05-14 MED ORDER — MIDODRINE HCL 5 MG PO TABS
5.0000 mg | ORAL_TABLET | Freq: Two times a day (BID) | ORAL | Status: DC
  Administered 2024-05-15 – 2024-05-18 (×7): 5 mg via ORAL
  Filled 2024-05-14 (×7): qty 1

## 2024-05-14 MED ORDER — SODIUM CHLORIDE 0.9% FLUSH: 3.0000 mL | INTRAVENOUS | Status: DC | PRN

## 2024-05-14 NOTE — H&P (Signed)
 Catherine [name] FMW:[id number] DOB: 30-Apr-1935 DOA: 05/14/2024     PCP: Micheal Wolm ORN, MD   Outpatient Specialists:  CARDS:  Dr. Annabella Scarce, MD   Patient arrived to ER on 05/14/24 at [phone] Referred by Attending Silvester Ales, MD   Patient coming from:    home Lives With family     C Hello this is Dr. Bard Complaint:  Chief Complaint  Patient presents with   Leg Swelling    HPI: Catherine [name] is a 89 y.o. female with medical history significant of CAD status post CABG status post CABG, rheumatic valvular disease with bioprosthetic MVR in 2018, PE A-fib status post maze on Eliquis  symptomatic bradycardia status post pacemaker in 2024 NSVT chronic diastolic CHF venous insufficiency moderate TR aortic stenosis, HTN, HLD CKD 3, COPD, spontaneous pneumothorax, chronic back pain, asthma  Presented with worsening leg edema Presents with increased leg swelling for the past 2 weeks exertional dyspnea keeps having recurrent hospitalizations with CHF reports gradual onset of worsening shortness of breath and cough he has been having some leg swelling Also endorses constipation  History significant for rheumatic heart disease with valvular heart disease status post mitral valve replacement known history of CHF status post Maze procedure A-fib on Eliquis  Known history of COPD Last week her cardiologist changed her diuretics because her kidney function started to worsen since her diuretics had decreased her lower extremity edema has worsened and she became more winded she waking up at night more short of breath try to use nebulizer at home but seems to be not helping more dyspnea on exertion no fevers no chills no nausea no vomiting or diarrhea No chest pain or shortness of breath She has history of pleural effusions requiring thoracentesis in the past Noted to be satting 90% on room air with minimal exertion of talking  Noted to be elevated troponin in ER but  stable  COVID-negative Moderate right pleural effusion Chest x-ray showing CHF    Reports she has lost 30 lb since christmas , no appetite   Denies significant ETOH intake   Does not smoke       Regarding pertinent Chronic problems:    Hyperlipidemia - not on statins  Lipid Panel     Component Value Date/Time   CHOL 227 (H) 04/09/2024 0843   TRIG 328 (H) 04/09/2024 0843   HDL 42 04/09/2024 0843   CHOLHDL 5.4 (H) 04/09/2024 0843   CHOLHDL 6 08/31/2023 0827   VLDL 41.6 (H) 08/31/2023 0827   LDLCALC 127 (H) 04/09/2024 0843   LDLCALC 77 07/09/2020 1403   LDLDIRECT 68.0 09/24/2019 1054   LABVLDL 58 (H) 04/09/2024 0843    HTN on bumex    chronic CHF diastolic/systolic/ combined -on Bumex  last echo Recent Results (from the past 56199 hours)  ECHOCARDIOGRAM COMPLETE   Collection Time: 04/24/24  3:44 PM  Result Value   Weight 1,840   Height 63   BP 139/64   S' Lateral 3.08   AR max vel 1.09   AV Area VTI 1.04   AV Mean grad 19.0   AV Peak grad 36.5   Ao pk vel 3.02   Area-P 1/2 2.34   AV Area mean vel 1.05   MV VTI 0.94   Est EF 70 - 75%   Narrative      ECHOCARDIOGRAM REPORT        IMPRESSIONS    1. No resting LVOT gradient. Left ventricular ejection fraction, by estimation, is 70  to 75%. The left ventricle has hyperdynamic function. The left ventricle has no regional wall motion abnormalities. Left ventricular diastolic parameters are  indeterminate.  2. Right ventricular systolic function is mildly reduced. The right ventricular size is normal. There is moderately elevated pulmonary artery systolic pressure. The estimated right ventricular systolic pressure is 50.5 mmHg.  3. Left atrial size was severely dilated.  4. Right atrial size was mildly dilated.  5. Small, echodensity noted on the chordal apparatus unchanged from 2023 PLAX view. There new LVOT is small related to valve struts with LVOT flow acceleration. The mitral valve has been repaired/replaced. No  evidence of mitral valve regurgitation. The  mean mitral valve gradient is 8.0 mmHg. There is a 29 mm Medtronic bioprosthetic valve present in the mitral position. Procedure Date: 10/14/2016.  6. Tricuspid valve regurgitation is moderate.  7. The aortic valve is tricuspid. Aortic valve regurgitation is not visualized. Mild to moderate aortic valve stenosis. Aortic valve area, by VTI measures 1.04 cm. Aortic valve mean gradient measures 19.0 mmHg. Aortic valve Vmax measures 3.02 m/s.  8. The inferior vena cava is dilated in size with <50% respiratory variability, suggesting right atrial pressure of 15 mmHg.  Comparison(s): Prior images reviewed side by side. Mitral gradients have increased from 2023 with similar heart rates (60s); aortic valve gradients have increased; a portion of this is related to dynamic LV function.           CAD   followed by cardiology                   COPD - not  on baseline oxygen       A. Fib -   atrial fibrillation CHA2DS2 vas score    6    current  on anticoagulation with   Eliquis ,       CKD stage IIIa baseline Cr 1.2 Estimated Creatinine Clearance: 24.9 mL/min (A) (by C-G formula based on SCr of 1.34 mg/dL (H)).  Lab Results  Component Value Date   CREATININE 1.34 (H) 05/14/2024   CREATININE 1.22 (H) 04/25/2024   CREATININE 1.47 (H) 04/24/2024   Lab Results  Component Value Date   NA 135 05/14/2024   CL 95 (L) 05/14/2024   K 4.3 05/14/2024   CO2 30 05/14/2024   BUN 33 (H) 05/14/2024   CREATININE 1.34 (H) 05/14/2024   GFRNONAA 38 (L) 05/14/2024   CALCIUM  10.0 05/14/2024   PHOS 3.5 04/25/2024   ALBUMIN  2.8 (L) 05/14/2024   GLUCOSE 118 (H) 05/14/2024    Chronic anemia - baseline hg Hemoglobin & Hematocrit  Recent Labs    04/18/24 0453 04/19/24 0411 05/14/24 0731  HGB 10.0* 10.4* 11.6*   Iron /TIBC/Ferritin/ %Sat    Component Value Date/Time   FERRITIN 200 09/05/2019 0505    While in ER:  CXR showed CHF  Covid negative  Was  transferred to [place] for cardioloyg consult troponin elevated     Lab Orders         Resp panel by RT-PCR (RSV, Flu A&B, Covid) Anterior Nasal Swab         CBC with Differential         Comprehensive metabolic panel         Pro Brain natriuretic peptide         Lactic acid, plasma     CXR - Congestive heart failure.    Following Medications were ordered in ER: Medications  furosemide  (LASIX ) injection 80 mg (has no administration in  time range)    _______________________________________________________ ER Provider Called:      Cardiology  They Recommend admit to medicine    SEEN on arrival to [facility]     ED Triage Vitals  Encounter Vitals Group     BP 05/14/24 0708 116/69     Girls Systolic BP Percentile --      Girls Diastolic BP Percentile --      Boys Systolic BP Percentile --      Boys Diastolic BP Percentile --      Pulse Rate 05/14/24 0708 75     Resp 05/14/24 0708 18     Temp 05/14/24 0708 98.1 F (36.7 C)     Temp Source 05/14/24 0708 Oral     SpO2 05/14/24 0708 95 %     Weight 05/14/24 1812 127 lb 11.2 oz (57.9 kg)     Height 05/14/24 1812 5' 3 (1.6 m)     Head Circumference --      Peak Flow --      Pain Score 05/14/24 0705 10     Pain Loc --      Pain Education --      Exclude from Growth Chart --   UFJK(75)@     _________________________________________ Significant initial  Findings: Abnormal Labs Reviewed  CBC WITH DIFFERENTIAL/PLATELET - Abnormal; Notable for the following components:      Result Value   RBC 3.80 (*)    Hemoglobin 11.6 (*)    HCT 35.0 (*)    All other components within normal limits  COMPREHENSIVE METABOLIC PANEL WITH GFR - Abnormal; Notable for the following components:   Chloride 95 (*)    Glucose, Bld 118 (*)    BUN 33 (*)    Creatinine, Ser 1.34 (*)    Total Protein 5.4 (*)    Albumin  2.8 (*)    GFR, Estimated 38 (*)    All other components within normal limits  PRO BRAIN NATRIURETIC PEPTIDE - Abnormal;  Notable for the following components:   Pro Brain Natriuretic Peptide 15,910.0 (*)    All other components within normal limits  TROPONIN T, HIGH SENSITIVITY - Abnormal; Notable for the following components:   Troponin T High Sensitivity 140 (*)    All other components within normal limits  TROPONIN T, HIGH SENSITIVITY - Abnormal; Notable for the following components:   Troponin T High Sensitivity 127 (*)    All other components within normal limits      _________________________ Troponin   Cardiac Panel (last 3 results) Recent Labs    05/14/24 2055  TROPONINIHS 159*     ECG: Ordered Personally reviewed and interpreted by me showing: HR : 63 Rhythm: A-V dual-paced rhythm with some inhibition No further analysis attempted due to paced rhythm QTC 448    The recent clinical data is shown below. Vitals:   05/14/24 1630 05/14/24 1700 05/14/24 1812 05/14/24 1919  BP: 104/64 (!) 112/51 (!) 143/79 132/63  Pulse:  60 90 85  Resp: 18 17 17 16   Temp:  97.8 F (36.6 C) 98.1 F (36.7 C) 98.6 F (37 C)  TempSrc:  Oral Oral Oral  SpO2: 99% 98% 95% 96%  Weight:   57.9 kg   Height:   5' 3 (1.6 m)       WBC     Component Value Date/Time   WBC 9.1 05/14/2024 0731   LYMPHSABS 1.1 05/14/2024 0731   LYMPHSABS 1.5 11/27/2020 1439   MONOABS 0.9 05/14/2024  0731   EOSABS 0.1 05/14/2024 0731   EOSABS 0.1 11/27/2020 1439   BASOSABS 0.0 05/14/2024 0731   BASOSABS 0.0 11/27/2020 1439       Results for orders placed or performed during the hospital encounter of 05/14/24  Resp panel by RT-PCR (RSV, Flu A&B, Covid) Anterior Nasal Swab     Status: None   Collection Time: 05/14/24  7:31 AM   Specimen: Anterior Nasal Swab  Result Value Ref Range Status   SARS Coronavirus 2 by RT PCR NEGATIVE NEGATIVE Final         Influenza A by PCR NEGATIVE NEGATIVE Final   Influenza B by PCR NEGATIVE NEGATIVE Final         Resp Syncytial Virus by PCR NEGATIVE NEGATIVE Final         *Note: Due  to a large number of results and/or encounters for the requested time period, some results have not been displayed. A complete set of results can be found in Results Review.       __________________________________________________________ Recent Labs  Lab 05/14/24 0731  NA 135  K 4.3  CO2 30  GLUCOSE 118*  BUN 33*  CREATININE 1.34*  CALCIUM  10.0    Cr   stable,   Lab Results  Component Value Date   CREATININE 1.34 (H) 05/14/2024   CREATININE 1.22 (H) 04/25/2024   CREATININE 1.47 (H) 04/24/2024    Recent Labs  Lab 05/14/24 0731  AST 33  ALT 13  ALKPHOS 84  BILITOT 0.3  PROT 5.4*  ALBUMIN  2.8*   Lab Results  Component Value Date   CALCIUM  10.0 05/14/2024   PHOS 3.5 04/25/2024    Plt: Lab Results  Component Value Date   PLT 153 05/14/2024    Recent Labs  Lab 05/14/24 0731  WBC 9.1  NEUTROABS 7.1  HGB 11.6*  HCT 35.0*  MCV 92.1  PLT 153    HG/HCT   stable,      Component Value Date/Time   HGB 11.6 (L) 05/14/2024 0731   HGB 13.3 01/03/2024 1151   HCT 35.0 (L) 05/14/2024 0731   HCT 41.7 01/03/2024 1151   MCV 92.1 05/14/2024 0731   MCV 94 01/03/2024 1151     _______________________________________________ Hospitalist was called for admission for acute on chronic CHF exacerbation   The following Work up has been ordered so far:  Orders Placed This Encounter  Procedures   Resp panel by RT-PCR (RSV, Flu A&B, Covid) Anterior Nasal Swab   DG Chest 2 View   CBC with Differential   Comprehensive metabolic panel   Pro Brain natriuretic peptide   Lactic acid, plasma   Diet Heart Room service appropriate? Yes; Fluid consistency: Thin   ED Cardiac monitoring   Cardiac Monitoring - Continuous Indefinite   Inpatient consult to Cardiology   Consult to hospitalist   ED EKG   EKG   EKG   EKG 12-Lead   Admit to Inpatient (patient's expected length of stay will be greater than 2 midnights or inpatient only procedure)     OTHER Significant initial   Findings:  labs showing:     DM  labs:  HbA1C: No results for input(s): HGBA1C in the last 8760 hours.     CBG (last 3)  No results for input(s): GLUCAP in the last 72 hours.        Cultures:    Component Value Date/Time   SDES FLUID PLEURAL RIGHT 04/24/2024 1404   SDES FLUID PLEURAL RIGHT  04/24/2024 1404   SPECREQUEST NONE 04/24/2024 1404   SPECREQUEST NONE 04/24/2024 1404   CULT  04/24/2024 1404    NO GROWTH 5 DAYS Performed at [facility], [address], [place], [place] [address]    REPTSTATUS 04/24/2024 FINAL 04/24/2024 1404   REPTSTATUS 04/29/2024 FINAL 04/24/2024 1404     Radiological Exams on Admission: DG Chest 2 View Result Date: 05/14/2024 CLINICAL DATA:  Shortness of breath. Cough. Bilateral leg swelling. EXAM: CHEST - 2 VIEW COMPARISON:  04/24/2024 and CT chest 09/10/2023. FINDINGS: Heart is enlarged, as before. Thoracic aorta is calcified. Pacemaker lead tips are in the right atrium and right ventricle. Mild interstitial prominence and indistinctness with a moderate right pleural effusion and small left pleural effusion. IMPRESSION: Congestive heart failure. Electronically Signed   By: Newell Eke M.D.   On: 05/14/2024 08:18   _______________________________________________________________________________________________________ Latest  Blood pressure 132/63, pulse 85, temperature 98.6 F (37 C), temperature source Oral, resp. rate 16, height 5' 3 (1.6 m), weight 57.9 kg, SpO2 96%.   Vitals  labs and radiology finding personally reviewed  Review of Systems:    Pertinent positives include:   Bilateral lower extremity swelling  Constitutional:  No weight loss, night sweats, Fevers, chills, fatigue, weight loss  HEENT:  No headaches, Difficulty swallowing,Tooth/dental problems,Sore throat,  No sneezing, itching, ear ache, nasal congestion, post nasal drip,  Cardio-vascular:  No chest pain, Orthopnea, PND, anasarca, dizziness, palpitations.no   GI:  No heartburn, indigestion, abdominal pain, nausea, vomiting, diarrhea, change in bowel habits, loss of appetite, melena, blood in stool, hematemesis Resp:  no shortness of breath at rest. No dyspnea on exertion, No excess mucus, no productive cough, No non-productive cough, No coughing up of blood.No change in color of mucus.No wheezing. Skin:  no rash or lesions. No jaundice GU:  no dysuria, change in color of urine, no urgency or frequency. No straining to urinate.  No flank pain.  Musculoskeletal:  No joint pain or no joint swelling. No decreased range of motion. No back pain.  Psych:  No change in mood or affect. No depression or anxiety. No memory loss.  Neuro: no localizing neurological complaints, no tingling, no weakness, no double vision, no gait abnormality, no slurred speech, no confusion  All systems reviewed and apart from HOPI all are negative _______________________________________________________________________________________________ Past Medical History:   Past Medical History:  Diagnosis Date   AKI (acute kidney injury) (HCC) 06/22/2022   Aortic stenosis, mild 07/26/2017   Arthritis    Asthma    last attack 02/2015   Cellulitis of left lower extremity 08/17/2016   Ulcer associated with severe venous insufficiency   Chronic anticoagulation    Chronic diastolic CHF (congestive heart failure) (HCC)    Chronic kidney disease    RIGHT MANY KIDNEY INFECTIONS AND STONES   COPD (chronic obstructive pulmonary disease) (HCC)    Coronary artery disease    Dizziness    H/O: rheumatic fever    Hypertension    PAF (paroxysmal atrial fibrillation) (HCC)    PONV (postoperative nausea and vomiting)    ' SOMETIMES', BUT NOT ALWAYS   Primary hypertension    RHEUMATIC MITRAL STENOSIS 01/05/2008   Qualifier: Diagnosis of   By: Neysa MD, Clinton D        S/P Maze operation for atrial fibrillation 10/14/2016   Complete bilateral atrial lesion set using cryothermy and  bipolar radiofrequency ablation - atrial appendage was not treated due to previous surgical procedure (open mitral commissurotomy)  S/P mitral valve replacement with bioprosthetic valve 10/14/2016   29 mm Medtronic Mosaic porcine bioprosthetic tissue valve   Sciatica 08/29/2023   Tricuspid regurgitation 07/14/2021   UTI (urinary tract infection)    Valvular heart disease    Has mitral stenosis with prior mitral commissurotomy in 1970    Past Surgical History:  Procedure Laterality Date   ABDOMINAL HYSTERECTOMY  1983   endometriosis   APPENDECTOMY     BACK SURGERY     neurosurgery x2   CARDIAC CATHETERIZATION     CARDIAC CATHETERIZATION N/A 08/03/2016   Procedure: Right/Left Heart Cath and Coronary Angiography;  Surgeon: Peter M Swaziland, MD;  Location: [facility] INVASIVE CV LAB;  Service: Cardiovascular;  Laterality: N/A;   cataract surg     CHOLECYSTECTOMY N/A 05/30/2015   Procedure: LAPAROSCOPIC CHOLECYSTECTOMY WITH INTRAOPERATIVE CHOLANGIOGRAM;  Surgeon: Morene Olives, MD;  Location: [facility] OR;  Service: General;  Laterality: N/A;   COLONOSCOPY     CORONARY ARTERY BYPASS GRAFT N/A 10/14/2016   Procedure: CORONARY ARTERY BYPASS GRAFTING (CABG);  Surgeon: Sudie VEAR Laine, MD;  Location: [facility] OR;  Service: Open Heart Surgery;  Laterality: N/A;   EYE SURGERY     IR THORACENTESIS ASP PLEURAL SPACE W/IMG GUIDE  04/24/2024   MAZE N/A 10/14/2016   Procedure: MAZE;  Surgeon: Sudie VEAR Laine, MD;  Location: [facility] OR;  Service: Open Heart Surgery;  Laterality: N/A;   MITRAL VALVE REPLACEMENT N/A 10/14/2016   Procedure: REDO MITRAL VALVE REPLACEMENT (MVR);  Surgeon: Sudie VEAR Laine, MD;  Location: [facility] OR;  Service: Open Heart Surgery;  Laterality: N/A;   MITRAL VALVE SURGERY Left 1970   Open mitral commissurotomy via left thoracotomy approach   PACEMAKER IMPLANT N/A 09/22/2023   Procedure: PACEMAKER IMPLANT;  Surgeon: Kennyth Chew, MD;  Location: [facility] INVASIVE CV LAB;  Service: Cardiovascular;  Laterality:  N/A;   TEE WITHOUT CARDIOVERSION N/A 07/05/2016   Procedure: TRANSESOPHAGEAL ECHOCARDIOGRAM (TEE);  Surgeon: Annabella Scarce, MD;  Location: [facility] ENDOSCOPY;  Service: Cardiovascular;  Laterality: N/A;   TEE WITHOUT CARDIOVERSION N/A 10/14/2016   Procedure: TRANSESOPHAGEAL ECHOCARDIOGRAM (TEE);  Surgeon: Sudie VEAR Laine, MD;  Location: [facility] OR;  Service: Open Heart Surgery;  Laterality: N/A;    Social History:  Ambulatory   independently     reports that she quit smoking about 48 years ago. Her smoking use included cigarettes. She started smoking about 63 years ago. She has a 15 pack-year smoking history. She has never used smokeless tobacco. She reports that she does not drink alcohol and does not use drugs.     Family History:   Family History  Problem Relation Age of Onset   Leukemia Father    Breast cancer Neg Hx    ______________________________________________________________________________________________ Allergies: Allergies  Allergen Reactions   Aldactone  [Spironolactone ] Other (See Comments)    Dyspnea    Amoxil  [Amoxicillin ] Palpitations and Other (See Comments)    Tachycardia   Zebeta  [Bisoprolol ] Shortness Of Breath   Calan  [Verapamil ] Other (See Comments)    Myalgias    Crestor  [Rosuvastatin ] Other (See Comments)    Myalgias    Flovent  [Fluticasone  Propionate] Other (See Comments)    Leg cramps   Livalo  [Pitavastatin ] Other (See Comments)    Myalgias    Lyrica  [Pregabalin ] Swelling   Neurontin [Gabapentin] Swelling   Norvasc  [Amlodipine ] Swelling    Low extremity edema   Zetia  [Ezetimibe ] Other (See Comments)    Myalgias   Zocor  [Simvastatin ] Other (See Comments)    Myalgias  Lotensin [Benazepril] Cough   Polytrim  [Polymyxin B -Trimethoprim ] Swelling     Prior to Admission medications   Medication Sig Start Date End Date Taking? Authorizing Provider  apixaban  (ELIQUIS ) 2.5 MG TABS tablet TAKE ONE TABLET TWICE DAILY (START 09/18/23 OR AS  DIRECTED) Patient taking differently: Take 5 mg by mouth 2 (two) times daily. 10/24/23  Yes Burchette, Wolm ORN, MD  bumetanide  (BUMEX ) 1 MG tablet Take 1 tablet (1 mg total) by mouth daily as needed (for worsening lower extremity swelling, weight gain 2 to 3 lbs in 24 hrs or 5 lbs in 7 days). 05/09/24  Yes Swinyer, Rosaline HERO, NP  cholecalciferol (VITAMIN D3) 25 MCG (1000 UNIT) tablet Take 1,000 Units by mouth daily.   Yes [provider]  diazepam  (VALIUM ) 5 MG tablet TAKE ONE TABLET BY MOUTH AT BEDTIME 12/12/23  Yes Burchette, Wolm ORN, MD  empagliflozin  (JARDIANCE ) 10 MG TABS tablet Take 1 tablet (10 mg total) by mouth daily. 05/09/24  Yes Swinyer, Rosaline HERO, NP  guaiFENesin  (MUCINEX ) 600 MG 12 hr tablet Take 1 tablet (600 mg total) by mouth 2 (two) times daily as needed for to loosen phlegm or cough. 04/25/24  Yes Arrien, Mauricio Daniel, MD  HYDROcodone -acetaminophen  (NORCO/VICODIN) 5-325 MG tablet TAKE ONE TABLET EVERY 8 HOURS AS NEEDED FOR PAIN 05/12/24  Yes Burchette, Wolm ORN, MD  midodrine  (PROAMATINE ) 5 MG tablet Take 1 tablet (5 mg total) by mouth 2 (two) times daily with a meal. 05/09/24  Yes Swinyer, Rosaline HERO, NP  Multiple Vitamins-Minerals (CERTAVITE/ANTIOXIDANTS) TABS Take 1 tablet by mouth daily.   Yes [provider]  ondansetron  (ZOFRAN ) 4 MG tablet Take 1 tablet (4 mg total) by mouth every 8 (eight) hours as needed for nausea or vomiting. 04/25/24  Yes Arrien, Mauricio Daniel, MD  acetaminophen  (TYLENOL ) 500 MG tablet Take 500 mg by mouth every 6 (six) hours as needed for mild pain (pain score 1-3).    [provider]  albuterol  (PROVENTIL ) (2.5 MG/3ML) 0.083% nebulizer solution Take 3 mLs (2.5 mg total) by nebulization every 6 (six) hours as needed (during COPD Exacerbation). 02/15/24 02/14/25  Hope Almarie ORN, NP  albuterol  (VENTOLIN  HFA) 108 (90 Base) MCG/ACT inhaler INHALE 2 PUFFS INTO THE LUNGS EVERY 4 HOURS AS NEEDED FOR AHEEZING OR SHORTNESS OF BREATH  02/21/24   Parrett, Tammy S, NP  estradiol  (ESTRACE ) 0.1 MG/GM vaginal cream Place 1 Applicatorful vaginally See admin instructions. Apply a small amount vaginally three nights a week on Monday, Wednesday and Friday.    [provider]  feeding supplement (ENSURE PLUS HIGH PROTEIN) LIQD Take 237 mLs by mouth 2 (two) times daily between meals. 04/25/24   Arrien, Mauricio Daniel, MD  fluticasone  (FLONASE ) 50 MCG/ACT nasal spray Place 1 spray into both nostrils daily as needed for allergies or rhinitis. 08/30/22   Sood, Vineet, MD  Multiple Vitamin (MULTIVITAMIN WITH MINERALS) TABS tablet Take 1 tablet by mouth daily. 04/26/24   Arrien, Elidia Sieving, MD  mupirocin  ointment (BACTROBAN ) 2 % APPLY TOPICALLY ONCE A DAY 03/16/24   Micheal Wolm ORN, MD    ___________________________________________________________________________________________________ Physical Exam:    05/14/2024    7:19 PM 05/14/2024    6:12 PM 05/14/2024    5:00 PM  Vitals with BMI  Height  5' 3   Weight  127 lbs 11 oz   BMI  22.63   Systolic 132 143 887  Diastolic 63 79 51  Pulse 85 90 60     1. General:  in  No  Acute distress    Chronically ill   -appearing 2. Psychological: Alert and   Oriented 3. Head/ENT:   Moist  Mucous Membranes                          Head Non traumatic, neck supple                           Poor Dentition 4. SKIN: normal  Skin turgor,  Skin clean Dry and intact no rash    5. Heart: Regular rate and rhythm cardiac Murmur, no Rub or gallop 6. Lungs:   no wheezes some crackles   7. Abdomen: Soft,  non-tender, Non distended thin bowel sounds present 8. Lower extremities: no clubbing, cyanosis,3+ edema 9. Neurologically Grossly intact, moving all 4 extremities equally   10. MSK: Normal range of motion    Chart has been reviewed  ______________________________________________________________________________________________  Assessment/Plan 89 y.o. female with medical history  significant of CAD status post CABG status post CABG, rheumatic valvular disease with bioprosthetic MVR in 2018, PE A-fib status post maze on Eliquis  symptomatic bradycardia status post pacemaker in 2024 NSVT chronic diastolic CHF venous insufficiency moderate TR aortic stenosis, HTN, HLD CKD 3, COPD, spontaneous pneumothorax, chronic back pain, asthma   Admitted for acute on chronic diastolic CHF    Present on Admission:  Acute on chronic diastolic CHF (congestive heart failure) (HCC)  Atrial fibrillation (HCC)  Essential hypertension  CKD stage 3a, GFR 45-59 ml/min (HCC)  COPD  GOLD 2  Coronary artery disease  Protein-calorie malnutrition, severe  Rheumatic heart disease  Sick sinus syndrome (HCC)    Acute on chronic diastolic CHF (congestive heart failure) (HCC) - Pt diagnosed with CHF based on presence of the following:  PND, OA, rales on exam, JVD, cardiomegaly, Pulmonary edema on CXR, and   bilateral leg edema,  pleural effusion    admit on telemetry,  cycle cardiac enzymes, Cardiac Panel (last 3 results)    obtain serial ECG  to evaluate for ischemia as a cause of heart failure  monitor daily weight:  Filed Weights   05/14/24 1812  Weight: 57.9 kg    Continue Jardiance  10 mg daily with the caveat that patient has prior history of UTIs and we will need to continue to watch this closely.   diurese with IV lasix  80 mg IV BID    and monitor orthostatics and creatinine to avoid over diuresis.  Order echogram to evaluate EF and valves  ACE/ARBi   Contraindicated    cardiology consulted seen pt on arrival    Atrial fibrillation (HCC) Continue Eliquis  patient not a candidate for beta-blockade  S/P mitral valve replacement with bioprosthetic valve - Oct 14, 2016 Chronic stable appreciate cardiology involvement  Essential hypertension Monitor BP while being diuresed  CKD stage 3a, GFR 45-59 ml/min (HCC)  -chronic avoid nephrotoxic medications such as NSAIDs, Vanco Zosyn   combo,  avoid hypotension, continue to follow renal function   COPD  GOLD 2 Chronic stable continue home medications  Coronary artery disease Patient not on aspirin  does not tolerate statin does not tolerate beta-blocker continue to monitor appreciate cardiology follow-up  Protein-calorie malnutrition, severe Check prealbumin order nutritional consult check albumin  this can contribute to anasarca  Rheumatic heart disease Chronic stable continue to follow-up  Sick sinus syndrome (HCC) Status post pacemaker  Pleural effusion due to CHF (congestive heart failure) (HCC) If does not  improve with diuresis may need thoracentesis   Other plan as per orders.  DVT prophylaxis: Elqiuis   Code Status:  DNR/DNI  as per patient   I had personally discussed CODE STATUS with patient    ACP   none   Family Communication:   Family not at  Bedside    Diet  Diet Orders (From admission, onward)     Start     Ordered   05/14/24 1935  Diet Heart Room service appropriate? Yes; Fluid consistency: Thin  Diet effective now       Question Answer Comment  Room service appropriate? Yes   Fluid consistency: Thin      05/14/24 1934            Disposition Plan:       To home once workup is complete and patient is stable   Following barriers for discharge:                                                        Electrolytes corrected                                                           Will likely need home health, home O2, set up                           Will need consultants to evaluate patient prior to discharge                              Consult Orders  (From admission, onward)           Start     Ordered   05/15/24 2012  Nutritional services consult  Once       Provider:  (Not yet assigned)  Question:  Reason for Consult?  Answer:  assess nutrtional status   05/14/24 2013   05/14/24 2012  PT eval and treat  Routine       Question:  Reason for PT?  Answer:  debility    05/14/24 2013   05/14/24 2012  OT eval and treat  Routine       Question:  Reason for OT?  Answer:  debility   05/14/24 2013   05/14/24 1951  Consult to Registered Dietitian  Once       Provider:  (Not yet assigned)  Question:  Reason for consult?  Answer:  Poor PO intake   05/14/24 1950   05/14/24 1148  Consult to hospitalist  Called Deb[facility] at [facility] to page hosp consult  Once       Provider:  (Not yet assigned)  Question Answer Comment  Place call to: [facility]   Reason for Consult Admit      05/14/24 1147            Would benefit from PT/OT eval prior to DC  Ordered                   Consults called: Cardiology   Admission status:  ED Disposition     ED Disposition  Admit   Condition  --   Comment  Hospital Area: [facility] [[id number]]  Level of Care: Telemetry Cardiac [[id number]]  May admit patient to [facility] or [facility] if equivalent level of care is available:: No  Interfacility transfer: Yes  Covid Evaluation: Asymptomatic - no recent exposure (last 10 days) testing not required  Diagnosis: CHF (congestive heart failure) [facility]) [[id number]]  Admitting Physician: FAIRY FRAMES [[id number]]  Attending Physician: FAIRY FRAMES [[id number]]  Certification:: I certify this patient will need inpatient services for at least 2 midnights  Expected Medical Readiness: 05/17/2024            inpatient     I Expect 2 midnight stay secondary to severity of patient's current illness need for inpatient interventions justified by the following:     Severe lab/radiological/exam abnormalities including:    Peripheral edema  Shortness of breath    and extensive comorbidities including:  CHF   CAD  COPD/asthma   That are currently affecting medical management.   I expect  patient to be hospitalized for 2 midnights requiring inpatient medical care.  Patient is at high risk for adverse outcome (such as loss of life or disability) if not treated.  Indication for  inpatient stay as follows:   New or worsening hypoxia    Need for IV  diuretics    Level of care     tele  For  24H    Chalsea Darko 05/14/2024, 10:08 PM     [facility]     after 2 AM please page floor coverage   If 7AM-7PM, please contact the day team taking care of the patient using [email or web address]

## 2024-05-14 NOTE — Assessment & Plan Note (Signed)
 Chronic stable continue to follow-up

## 2024-05-14 NOTE — ED Notes (Signed)
 Patient transported to Ultrasound

## 2024-05-14 NOTE — Assessment & Plan Note (Signed)
 Continue Eliquis  patient not a candidate for beta-blockade

## 2024-05-14 NOTE — ED Triage Notes (Signed)
 C/o increased bilateral leg swelling x 2 weeks. Exertional dyspnea. Recent hospitalization for CHF.

## 2024-05-14 NOTE — Assessment & Plan Note (Addendum)
-   Pt diagnosed with CHF based on presence of the following:  PND, OA, rales on exam, JVD, cardiomegaly, Pulmonary edema on CXR, and   bilateral leg edema,  pleural effusion    admit on telemetry,  cycle cardiac enzymes, Cardiac Panel (last 3 results)    obtain serial ECG  to evaluate for ischemia as a cause of heart failure  monitor daily weight:  Filed Weights   05/14/24 1812  Weight: 57.9 kg    Continue Jardiance  10 mg daily with the caveat that patient has prior history of UTIs and we will need to continue to watch this closely.   diurese with IV lasix  80 mg IV BID    and monitor orthostatics and creatinine to avoid over diuresis.  Order echogram to evaluate EF and valves  ACE/ARBi   Contraindicated    cardiology consulted seen pt on arrival

## 2024-05-14 NOTE — Subjective & Objective (Signed)
 Presents with increased leg swelling for the past 2 weeks exertional dyspnea keeps having recurrent hospitalizations with CHF reports gradual onset of worsening shortness of breath and cough he has been having some leg swelling Also endorses constipation  History significant for rheumatic heart disease with valvular heart disease status post mitral valve replacement known history of CHF status post Maze procedure A-fib on Eliquis  Known history of COPD Last week her cardiologist changed her diuretics because her kidney function started to worsen since her diuretics had decreased her lower extremity edema has worsened and she became more winded she waking up at night more short of breath try to use nebulizer at home but seems to be not helping more dyspnea on exertion no fevers no chills no nausea no vomiting or diarrhea No chest pain or shortness of breath She has history of pleural effusions requiring thoracentesis in the past Noted to be satting 90% on room air with minimal exertion of talking  Noted to be elevated troponin in ER but stable  COVID-negative Moderate right pleural effusion Chest x-ray showing CHF

## 2024-05-14 NOTE — ED Notes (Signed)
 Patient provided with microwave mac and cheese meal and ice water .

## 2024-05-14 NOTE — Assessment & Plan Note (Signed)
 Monitor BP while being diuresed

## 2024-05-14 NOTE — Assessment & Plan Note (Signed)
 Patient not on aspirin  does not tolerate statin does not tolerate beta-blocker continue to monitor appreciate cardiology follow-up

## 2024-05-14 NOTE — Assessment & Plan Note (Signed)
 If does not improve with diuresis may need thoracentesis

## 2024-05-14 NOTE — ED Notes (Signed)
 Catherine [name] at [facility] for transport

## 2024-05-14 NOTE — Assessment & Plan Note (Signed)
Chronic stable appreciate cardiology involvement

## 2024-05-14 NOTE — Assessment & Plan Note (Signed)
 Chronic stable continue home medications ?

## 2024-05-14 NOTE — Consult Note (Addendum)
 Cardiology Consult    Patient ID: KINSLEY HOLDERMAN MRN: [id number], DOB/AGE: Jul 18, 1935   Admit date: 05/14/2024 Date of Consult: 05/14/2024  Primary Physician: Micheal Wolm ORN, MD Primary Cardiologist: Annabella Scarce, MD  Electrophysiologist:  Fonda Kitty, MD  Requesting Provider: MYRTIS Pac, MD  Patient Profile    Catherine [name] is a 89 y.o. 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 in the 1970s with bioprosthetic MVR in 2018 at the time of CABG, paroxysmal atrial fibrillation status post maze at time of CABG in 2018, symptomatic bradycardia requiring Medtronic permanent pacemaker in December 2024, nonsustained/slow VT noted on pacemaker interrogation, junctional tachycardia, chronic HFpEF, chronic lower extremity edema, venous insufficiency, mild to moderate TR/aortic stenosis, hypertension, hyperlipidemia, stage II-III chronic kidney disease, asthma, COPD, prior COVID infection complicated by spontaneous pneumothorax, chronic back pain/sciatica, mild carotid arterial disease by ultrasound in 2022, and pleural effusion status post thoracentesis in July 2025, who is being seen today for the evaluation of recurrent CHF at the request of Dr. Pac.  Past Medical History   Subjective  Past Medical History:  Diagnosis Date   AKI (acute kidney injury) (HCC) 06/22/2022   Aortic stenosis, mild 07/26/2017   Arthritis    Asthma    last attack 02/2015   Cellulitis of left lower extremity 08/17/2016   Ulcer associated with severe venous insufficiency   Chronic anticoagulation    Chronic diastolic CHF (congestive heart failure) (HCC)    Chronic kidney disease    RIGHT MANY KIDNEY INFECTIONS AND STONES   COPD (chronic obstructive pulmonary disease) (HCC)    Coronary artery disease    Dizziness    H/O: rheumatic fever    Hypertension    PAF (paroxysmal atrial fibrillation) (HCC)     PONV (postoperative nausea and vomiting)    ' SOMETIMES', BUT NOT ALWAYS   Primary hypertension    RHEUMATIC MITRAL STENOSIS 01/05/2008   Qualifier: Diagnosis of   By: Neysa MD, Clinton D        S/P Maze operation for atrial fibrillation 10/14/2016   Complete bilateral atrial lesion set using cryothermy and bipolar radiofrequency ablation - atrial appendage was not treated due to previous surgical procedure (open mitral commissurotomy)   S/P mitral valve replacement with bioprosthetic valve 10/14/2016   29 mm Medtronic Mosaic porcine bioprosthetic tissue valve   Sciatica 08/29/2023   Tricuspid regurgitation 07/14/2021   UTI (urinary tract infection)    Valvular heart disease    Has mitral stenosis with prior mitral commissurotomy in 1970    Past Surgical History:  Procedure Laterality Date   ABDOMINAL HYSTERECTOMY  1983   endometriosis   APPENDECTOMY     BACK SURGERY     neurosurgery x2   CARDIAC CATHETERIZATION     CARDIAC CATHETERIZATION N/A 08/03/2016   Procedure: Right/Left Heart Cath and Coronary Angiography;  Surgeon: Peter M Swaziland, MD;  Location: [facility] INVASIVE CV LAB;  Service: Cardiovascular;  Laterality: N/A;   cataract surg     CHOLECYSTECTOMY N/A 05/30/2015   Procedure: LAPAROSCOPIC CHOLECYSTECTOMY WITH INTRAOPERATIVE CHOLANGIOGRAM;  Surgeon: Morene Olives, MD;  Location: [facility] OR;  Service: General;  Laterality: N/A;   COLONOSCOPY     CORONARY ARTERY BYPASS GRAFT N/A 10/14/2016   Procedure: CORONARY ARTERY BYPASS GRAFTING (CABG);  Surgeon: Sudie VEAR Laine, MD;  Location: [facility] OR;  Service: Open Heart Surgery;  Laterality: N/A;   EYE  SURGERY     IR THORACENTESIS ASP PLEURAL SPACE W/IMG GUIDE  04/24/2024   MAZE N/A 10/14/2016   Procedure: MAZE;  Surgeon: Sudie VEAR Laine, MD;  Location: [facility] OR;  Service: Open Heart Surgery;  Laterality: N/A;   MITRAL VALVE REPLACEMENT N/A 10/14/2016   Procedure: REDO MITRAL VALVE REPLACEMENT (MVR);  Surgeon: Sudie VEAR Laine, MD;   Location: [facility] OR;  Service: Open Heart Surgery;  Laterality: N/A;   MITRAL VALVE SURGERY Left 1970   Open mitral commissurotomy via left thoracotomy approach   PACEMAKER IMPLANT N/A 09/22/2023   Procedure: PACEMAKER IMPLANT;  Surgeon: Kennyth Chew, MD;  Location: [facility] INVASIVE CV LAB;  Service: Cardiovascular;  Laterality: N/A;   TEE WITHOUT CARDIOVERSION N/A 07/05/2016   Procedure: TRANSESOPHAGEAL ECHOCARDIOGRAM (TEE);  Surgeon: Annabella Scarce, MD;  Location: [facility] ENDOSCOPY;  Service: Cardiovascular;  Laterality: N/A;   TEE WITHOUT CARDIOVERSION N/A 10/14/2016   Procedure: TRANSESOPHAGEAL ECHOCARDIOGRAM (TEE);  Surgeon: Sudie VEAR Laine, MD;  Location: [facility] OR;  Service: Open Heart Surgery;  Laterality: N/A;     Allergies  Allergies  Allergen Reactions   Aldactone  [Spironolactone ] Other (See Comments)    Dyspnea    Amoxil  [Amoxicillin ] Palpitations and Other (See Comments)    Tachycardia   Zebeta  [Bisoprolol ] Shortness Of Breath   Calan  [Verapamil ] Other (See Comments)    Myalgias    Crestor  [Rosuvastatin ] Other (See Comments)    Myalgias    Flovent  [Fluticasone  Propionate] Other (See Comments)    Leg cramps   Livalo  [Pitavastatin ] Other (See Comments)    Myalgias    Lyrica  [Pregabalin ] Swelling   Neurontin [Gabapentin] Swelling   Norvasc  [Amlodipine ] Swelling    Low extremity edema   Zetia  [Ezetimibe ] Other (See Comments)    Myalgias   Zocor  [Simvastatin ] Other (See Comments)    Myalgias    Lotensin [Benazepril] Cough   Polytrim  [Polymyxin B -Trimethoprim ] Swelling       History of Present Illness   Subjective  89 y.o. 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 in the 1970s followed by bioprosthetic MVR in the setting of severe mitral stenosis in 2018 at the time of CABG, paroxysmal atrial fibrillation status post maze at time of CABG in 2018, symptomatic bradycardia  requiring Medtronic permanent pacemaker in December 2024, nonsustained/slow VT noted on pacemaker interrogation, junctional tachycardia, chronic HFpEF, chronic lower extremity edema, venous insufficiency, mild to moderate TR/aortic stenosis, hypertension, hyperlipidemia, stage II-III chronic kidney disease, asthma, COPD, prior COVID infection complicated by spontaneous pneumothorax, chronic back pain/sciatica, mild carotid arterial disease by ultrasound in 2022, and pleural effusion status post thoracentesis in July 2025.    She has a long h/o persistent lower extremity edema despite outpatient titration of diuretic therapy.  She is unable to tolerate compression hose and previously declined vascular surgery evaluation.  GDMT somewhat limited by intolerances.  SGLT2 inhibitor therapy historically deferred due to recurrent UTIs.  In April 2023, she was admitted with heart failure and rapid atrial fibrillation, which was treated with beta-blocker and amiodarone  therapy.  The latter was subsequently discontinued due to side effects of insomnia.  In 2024, she was noted to have junctional bradycardia by ECG.  This was followed by ZIO monitoring and she ultimately underwent Medtronic permanent pacemaker placement in December 2024 with a left bundle branch area lead.  Subsequent device monitoring detected slow VT requiring titration of metoprolol , which was later changed  to bisoprolol  due to wheezing and fatigue, though the latter was also subsequently discontinued due to intolerance.  Other intolerances have included spironolactone  (dyspnea), verapamil  (myalgias), amlodipine  (edema), and statins (myalgias).  Patient was admitted in late June to early July with HFpEF and possible community-acquired pneumonia.  At subsequent outpatient follow-up in mid July, she continued of lower extremity edema and was maintained on daily Bumex  and once weekly metolazone .  At EP follow-up on April 12, 2024, she was feeling poorly and  was noted to be in junctional tachycardia and ablation was planned for May 30, 2024 (when her daughter will be in town).  Unfortunately, she required admission in late July due to worsening lower extremity edema, increasing abdominal girth, cough, and lower extremity cellulitis.  Troponin was up to 135.  She was seen by our team and aggressively diuresed.  Echo showed an EF of 70 to 75% with hyperdynamic LV function, mildly reduced RV function, moderately elevated RVSP (50.5 mmHg), severely dilated left atrium, small, unchanged echodensity noted on the chordal apparatus of the mitral valve, no MR, mean mitral valve gradient of 8 mmHg, moderate TR, and mild to moderate aortic stenosis.  She underwent right-sided thoracentesis on July 29 with removal of 1.1 L.  Diuresis was held due to rising creatinine.  Albumin  was low at 2.0, felt to be contributing to third spacing.  She was discharged home July 30 on Bumex  1 mg daily as needed based on weight.  Jardiance  10 mg daily was started with note to watch for UTIs.  Patient was felt to be stable at August 7 office follow-up.    Ms. Delk says that following her hospital discharge on July 30, she started noticing increasing lower extremity edema, weight gain, increased abdominal girth, and also dyspnea on exertion.  She has more regularly been using inhalers and nebulizers due to dyspnea.  She has gone several days with 1 pound weight gain but had not been taking her Bumex  as it is prescribed to be taken for 2 pound weight gain in 24 hours.  As result, she is now up approximately 6 pounds since her hospital discharge.  Due to progressive edema and dyspnea, she presented to the [facility] emergency department this morning.  Upon arrival to the ED, Ms. Dura was febrile an normotensive.  ECG showed AV paced rhythm @ 65 bpm.  Labs notable for BUN/Creat 33/1.34 (similar to prior), pBNP 15910, hsTrop 140  127, lactic acid nl @ 1.7, H/H stable @ 11.6/35.0.  CXR showed  CHF w/ moderate right and small left pleural effusions.  An IV was placed however, it infiltrated and she has yet to receive any IV diuretic.  She is currently stable at rest but very concerned about her lower extremity edema and also concerned that she may have to cancel her planned ablation in early September.  She denies palpitations, PND, dizziness, syncope, chest pain.  She has been having orthopnea and early satiety, noting poor appetite for the past year.  Home Medications   Subjective    Prior to Admission medications   Medication Sig Start Date End Date Taking? Authorizing Provider  apixaban  (ELIQUIS ) 2.5 MG TABS tablet TAKE ONE TABLET TWICE DAILY (START 09/18/23 OR AS DIRECTED) Patient taking differently: Take 5 mg by mouth 2 (two) times daily. 10/24/23  Yes Burchette, Wolm ORN, MD  bumetanide  (BUMEX ) 1 MG tablet Take 1 tablet (1 mg total) by mouth daily as needed (for worsening lower extremity swelling, weight gain 2 to 3  lbs in 24 hrs or 5 lbs in 7 days). 05/09/24  Yes Swinyer, Rosaline HERO, NP  cholecalciferol (VITAMIN D3) 25 MCG (1000 UNIT) tablet Take 1,000 Units by mouth daily.   Yes [provider]  diazepam  (VALIUM ) 5 MG tablet TAKE ONE TABLET BY MOUTH AT BEDTIME 12/12/23  Yes Burchette, Wolm ORN, MD  empagliflozin  (JARDIANCE ) 10 MG TABS tablet Take 1 tablet (10 mg total) by mouth daily. 05/09/24  Yes Swinyer, Rosaline HERO, NP  guaiFENesin  (MUCINEX ) 600 MG 12 hr tablet Take 1 tablet (600 mg total) by mouth 2 (two) times daily as needed for to loosen phlegm or cough. 04/25/24  Yes Arrien, Mauricio Daniel, MD  HYDROcodone -acetaminophen  (NORCO/VICODIN) 5-325 MG tablet TAKE ONE TABLET EVERY 8 HOURS AS NEEDED FOR PAIN 05/12/24  Yes Burchette, Wolm ORN, MD  midodrine  (PROAMATINE ) 5 MG tablet Take 1 tablet (5 mg total) by mouth 2 (two) times daily with a meal. 05/09/24  Yes Swinyer, Rosaline HERO, NP  Multiple Vitamins-Minerals (CERTAVITE/ANTIOXIDANTS) TABS Take 1 tablet by mouth daily.   Yes  [provider]  ondansetron  (ZOFRAN ) 4 MG tablet Take 1 tablet (4 mg total) by mouth every 8 (eight) hours as needed for nausea or vomiting. 04/25/24  Yes Arrien, Mauricio Daniel, MD  acetaminophen  (TYLENOL ) 500 MG tablet Take 500 mg by mouth every 6 (six) hours as needed for mild pain (pain score 1-3).    [provider]  albuterol  (PROVENTIL ) (2.5 MG/3ML) 0.083% nebulizer solution Take 3 mLs (2.5 mg total) by nebulization every 6 (six) hours as needed (during COPD Exacerbation). 02/15/24 02/14/25  Hope Almarie ORN, NP  albuterol  (VENTOLIN  HFA) 108 (90 Base) MCG/ACT inhaler INHALE 2 PUFFS INTO THE LUNGS EVERY 4 HOURS AS NEEDED FOR AHEEZING OR SHORTNESS OF BREATH 02/21/24   Parrett, Madelin RAMAN, NP  estradiol  (ESTRACE ) 0.1 MG/GM vaginal cream Place 1 Applicatorful vaginally See admin instructions. Apply a small amount vaginally three nights a week on Monday, Wednesday and Friday.    [provider]  feeding supplement (ENSURE PLUS HIGH PROTEIN) LIQD Take 237 mLs by mouth 2 (two) times daily between meals. 04/25/24   Arrien, Mauricio Daniel, MD  fluticasone  (FLONASE ) 50 MCG/ACT nasal spray Place 1 spray into both nostrils daily as needed for allergies or rhinitis. 08/30/22   Sood, Vineet, MD  Multiple Vitamin (MULTIVITAMIN WITH MINERALS) TABS tablet Take 1 tablet by mouth daily. 04/26/24   Arrien, Mauricio Daniel, MD  mupirocin  ointment (BACTROBAN ) 2 % APPLY TOPICALLY ONCE A DAY 03/16/24   Burchette, Wolm ORN, MD     Family History    Family History  Problem Relation Age of Onset   Leukemia Father    Breast cancer Neg Hx    She indicated that her mother is deceased. She indicated that her father is deceased. She indicated that the status of her neg hx is unknown.   Social History    Social History   Socioeconomic History   Marital status: Married    Spouse name: Not on file   Number of children: Not on file   Years of education: Not on file   Highest education level:  Not on file  Occupational History   Not on file  Tobacco Use   Smoking status: Former    Current packs/day: 0.00    Average packs/day: 1 pack/day for 15.0 years (15.0 ttl pk-yrs)    Types: Cigarettes    Start date: 09/27/1960    Quit date: 09/28/1975    Years since quitting:  48.6   Smokeless tobacco: Never  Vaping Use   Vaping status: Never Used  Substance and Sexual Activity   Alcohol use: No    Alcohol/week: 0.0 standard drinks of alcohol   Drug use: No   Sexual activity: Yes  Other Topics Concern   Not on file  Social History Narrative   Not on file   Social Drivers of Health   Financial Resource Strain: Low Risk  (04/09/2022)   Overall Financial Resource Strain (CARDIA)    Difficulty of Paying Living Expenses: Not hard at all  Food Insecurity: No Food Insecurity (04/26/2024)   Hunger Vital Sign    Worried About Running Out of Food in the Last Year: Never true    Ran Out of Food in the Last Year: Never true  Transportation Needs: No Transportation Needs (04/26/2024)   PRAPARE - Administrator, Civil Service (Medical): No    Lack of Transportation (Non-Medical): No  Physical Activity: Insufficiently Active (04/09/2022)   Exercise Vital Sign    Days of Exercise per Week: 5 days    Minutes of Exercise per Session: 20 min  Stress: No Stress Concern Present (04/09/2022)   [employer] of Occupational Health - Occupational Stress Questionnaire    Feeling of Stress : Not at all  Social Connections: Socially Integrated (04/16/2024)   Social Connection and Isolation Panel    Frequency of Communication with Friends and Family: More than three times a week    Frequency of Social Gatherings with Friends and Family: More than three times a week    Attends Religious Services: More than 4 times per year    Active Member of [employer] or Organizations: Yes    Attends Banker Meetings: More than 4 times per year    Marital Status: Married  Catering manager  Violence: Patient Declined (04/26/2024)   Humiliation, Afraid, Rape, and Kick questionnaire    Fear of Current or Ex-Partner: Patient declined    Emotionally Abused: Patient declined    Physically Abused: Patient declined    Sexually Abused: Patient declined     Review of Systems    General:  No chills, fever, night sweats or weight changes.  Cardiovascular:  No chest pain, +++ dyspnea on exertion, +++ bilat LE edema to thighs, +++ orthopnea, no palpitations, paroxysmal nocturnal dyspnea. Dermatological: No rash, lesions/masses Respiratory: No cough, +++ dyspnea Urologic: No hematuria, dysuria Abdominal:   +++ inc abd girth.  +++ early satiety/anorexia.  No nausea, vomiting, diarrhea, bright red blood per rectum, melena, or hematemesis Neurologic:  No visual changes, wkns, changes in mental status. All other systems reviewed and are otherwise negative except as noted above.     Objective   Physical Exam    Blood pressure (!) 143/79, pulse 90, temperature 98.1 F (36.7 C), temperature source Oral, resp. rate 17, height 5' 3 (1.6 m), weight 57.9 kg, SpO2 95%.  General: Pleasant, NAD Psych: Normal affect. Neuro: Alert and oriented X 3. Moves all extremities spontaneously. HEENT: Normal  Neck: Supple without bruits or JVD. Lungs:  Resp regular and unlabored, diminished breath sounds right base, otherwise clear to auscultation. Heart: RRR no s3, s4, or murmurs. Abdomen: Soft, non-tender, non-distended, BS + x 4.  Extremities: No clubbing, cyanosis or edema. DP/PT2+, Radials 2+ and equal bilaterally.  Labs    Cardiac Enzymes HS Troponin T  140  127  ProBNP    Component Value Date/Time   PROBNP 15,910.0 (H) 05/14/2024 0731   PROBNP  253.0 (H) 07/09/2020 1135    Lab Results  Component Value Date   WBC 9.1 05/14/2024   HGB 11.6 (L) 05/14/2024   HCT 35.0 (L) 05/14/2024   MCV 92.1 05/14/2024   PLT 153 05/14/2024    Recent Labs  Lab 05/14/24 0731  NA 135  K 4.3  CL 95*   CO2 30  BUN 33*  CREATININE 1.34*  CALCIUM  10.0  PROT 5.4*  BILITOT 0.3  ALKPHOS 84  ALT 13  AST 33  GLUCOSE 118*   Lab Results  Component Value Date   CHOL 227 (H) 04/09/2024   HDL 42 04/09/2024   LDLCALC 127 (H) 04/09/2024   TRIG 328 (H) 04/09/2024   Lab Results  Component Value Date   DDIMER 1.14 (H) 10/29/2021      Radiology Studies    DG Chest 2 View Result Date: 05/14/2024 CLINICAL DATA:  Shortness of breath. Cough. Bilateral leg swelling. EXAM: CHEST - 2 VIEW COMPARISON:  04/24/2024 and CT chest 09/10/2023. FINDINGS: Heart is enlarged, as before. Thoracic aorta is calcified. Pacemaker lead tips are in the right atrium and right ventricle. Mild interstitial prominence and indistinctness with a moderate right pleural effusion and small left pleural effusion. IMPRESSION: Congestive heart failure. Electronically Signed   By: Newell Eke M.D.   On: 05/14/2024 08:18     ECG & Cardiac Imaging    AV paced @ 65 - personally reviewed.  Assessment & Plan    1.  Acute on chronic heart failure with preserved ejection fraction: Patient with a long history of lower extremity edema and recent admission in mid to late July secondary to volume overload, at which time she required right-sided thoracentesis (1.1 L) as well.  Echo during that admission showed an EF of 70 to 75% with RVSP of 50.5 mmHg, stable mitral valve function, and mild to moderate aortic stenosis.  In the setting of acute on chronic stage III kidney disease, patient was discharged home on Bumex  1 mg daily as needed for weight gain of 2 pounds.  She notes that following discharge, she has frequent 1 pound weight gains but because she was not gaining 2 pounds in 24 hours, she refrain from taking Bumex .  In that setting, she has had progressive dyspnea on exertion, increasing abdominal girth, dyspnea, and orthopnea.  She presented to [facility] this morning due to progressive symptoms.  There, BNP 15,910.  Chest x-ray  showed CHF with moderate right-sided effusion.  She does not currently have an IV and has yet to receive intravenous Lasix .  IV team to place IV now.  Will add Lasix  80 mg IV twice daily.  Follow creatinine closely -currently at the higher end of her range at 1.34.  Continue Jardiance  10 mg daily with the caveat that patient has prior history of UTIs and we will need to continue to watch this closely.  2.  Coronary artery disease/demand ischemia: Status post CABG x 1 in 2018 with a vein graft to the distal RCA at the time of her MVR for severe mitral stenosis.  She has residual 50% mid left circumflex disease noted on catheterization at that time.  She does not experience chest pain.  For the second admission in a month, troponin is mildly elevated with flat trend of 140  127.  On prior admission with similar trend, echo showed hyperdynamic LV function without wall motion abnormalities.  Suspect demand ischemia.  She is not on aspirin  in the setting of outpatient Eliquis  therapy.  Multiple intolerances to routine GDMT for CAD including intolerances to beta-blockers and statins.  No plan for ischemic evaluation at this time.  3.  Mitral valve disease status post bioprosthetic MVR in 2018: Stable functioning mitral valve prosthesis on echo last month.  4.  Symptomatic bradycardia: Status post permanent pacemaker placement with right atrial appendage and left bundle branch area leads.  5.  Junctional tachycardia: Previously noted on device interrogation in July.  Evaluated by Dr. Waddell with plan for catheter ablation on September 3, dates she chose due to her daughter being in town.  On presentation, she was AV paced.  On telemetry she was sustaining rates in the 90s but just broke into the 50s.  Suspect recurrent junctional tachycardia.  Unclear what role this is playing in her heart failure symptoms.  Repeating ECG.  She is intolerant to multiple beta-blockers and calcium  channel blockers, limiting rate  control options  6.  Primary hypertension: Blood pressure currently elevated.  Reassess following response to IV diuretics.  7.  Hyperlipidemia: LDL of 127 in July.  She is statin and Zetia  intolerant.  Can reconsider PCSK9 inhibitor versus bempedoic acid in the outpatient setting if not previously trialed.  8.  Stage III chronic kidney disease/nephrotic syndrome: Current at upper end of her typical range at 1.34 with a BUN of 33.  Albumin  2.8.  Urinalysis in July showed protein greater than 300.  She was referred to nephrology in the outpatient setting but does not yet have an appointment.  Follow renal function during admission and consider nephrology consultation while inpatient.  9.  Paroxysmal atrial fibrillation: On chronic Eliquis .  No AV nodal blocking agents in the setting of prior intolerance.  Risk Assessment/Risk Scores:        New [place]  Heart Association (NYHA) Functional Class NYHA Class IV  CHA2DS2-VASc Score = 6   This indicates a 9.7% annual risk of stroke. The patient's score is based upon: CHF History: 1 HTN History: 1 Diabetes History: 0 Stroke History: 0 Vascular Disease History: 1 Age Score: 2 Gender Score: 1     Signed, Lonni Meager, NP 05/14/2024, 7:14 PM  For questions or updates, please contact   Please consult [email or web address] for contact info under Cardiology/STEMI.   I have personally seen and examined the patient.  My HPI, Exam, and assessment and plan are below, independent of the NPP above.  Ms. Denes is an 89 year old with complex cardiac history who presents with worsening leg swelling and shortness of breath.  She has experienced worsening leg swelling over the past week, accompanied by cough and shortness of breath. The swelling began two weeks ago and has progressively worsened, leading to the development of blue blisters on her legs. She feels scared due to the inability to see a doctor promptly.  Her cardiac history includes heart  failure with preserved ejection fraction, atrial fibrillation, sinus node dysfunction with a permanent pacemaker, junctional tachycardia, and supraventricular tachycardia. She was planned for an SVT ablation due to intolerance to beta blockers (05/2024). Her cardiac care is further complicated by rheumatic mitral valve disease, status post bioprosthetic mitral valve replacement in 2018, and a CABG to SVG to distal RCA.  She has nephrotic range proteinuria. A 24-hour urine collection on April 25, 2024, showed significant proteinuria. She reports hypoalbuminemia and poor nutritional status, having lost about 35 pounds due to loss of appetite. Her albumin  level was 2.0 at discharge and is now 2.8, but remains low. She is currently on midodrine   5 mg BID and has been receiving diuretics.  She experiences difficulty breathing, which has worsened over the past two weeks. She mentions being weak and hard of hearing since her last hospital discharge. A chest x-ray showed pleural effusions.  She struggles with poor appetite and finds it difficult to prepare meals, leading to inadequate nutrition. She acknowledges the importance of low salt intake but emphasizes the need to increase her protein levels.  Exam notable for  Gen: no distress, elderly female Neck: + JVD with low V wave nadir Cardiac: No Rubs or Gallops, systolic murmur, RRR +2 radial pulses Respiratory: Crackles bilaterally, normal effort, normal  respiratory rate GI: Soft, nontender, non-distended  MS: +2  edema but skin is warm;  moves all extremities Neuro:  At time of evaluation, alert and oriented to person/place/time/situation  Psych: Normal affect, patient feels ok  Labs notable for   Creatinine: 1.34 (05/14/2024) Potassium: within normal limits (05/14/2024) Albumin : 2.8 (05/14/2024) Total protein: 5.4 (05/14/2024) GFR: 38 (05/14/2024)  RADIOLOGY Chest x-ray: pulmonary vascular congestion with bilateral pleural effusions  (05/14/2024)- personally reviewed  DIAGNOSTIC EKG: A paced, V paced (05/14/2024) TELE: AV Paced presently  In assessment and plan:   Acute on chronic Heart failure with Preserved Ejection Fraction; volume overload and bilateral pleural effusions - NYHA IV patient with worsening leg swelling, cough, and shortness of breath over the past week. Bilateral pleural effusions and pulmonary vascular congestion on chest x-ray. Rales present on examination. Fluid overload likely contributing to respiratory symptoms. - 80 mg Lasix  IV BID - Consider combination therapy with Bumex  and metolazone  if inadequate diuresis - has many drug intolerances; would not start MRA (requiring midodrine ) ; she is on Jardiance  10 mg  Nephrotic syndrome with nephrotic range proteinuria and chronic kidney disease - Nephrotic range proteinuria on 24-hour urine collection. At last evaluation Hypoalbuminemia with marginal improvement from 2.0 to 2.8. Chronic kidney disease with decreased GFR to 38. Potential underlying multiple myeloma. Poor nutritional status with significant weight loss and loss of appetite. 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 Atrial fibrillation and supraventricular tachycardia with planned SVT ablation in September. No current chest pain or symptoms related to coronary artery disease. Pacemaker functioning appropriately. Previous CABG and mitral valve replacement. - Proceed with planned SVT ablation in September for now; I discussed her overall goals of care given her  significant weight loss, frailty and the above; she at this time would not like to change her GOC (get better, get legs fluid free)  Stanly Leavens, MD FASE [facility] Cardiologist [facility]  [address] [place], [address] [place], [place] [address] [phone]  7:58 PM

## 2024-05-14 NOTE — Assessment & Plan Note (Signed)
 Check prealbumin order nutritional consult check albumin  this can contribute to anasarca

## 2024-05-14 NOTE — Assessment & Plan Note (Signed)
-  chronic avoid nephrotoxic medications such as NSAIDs, Vanco Zosyn combo,  avoid hypotension, continue to follow renal function

## 2024-05-14 NOTE — Assessment & Plan Note (Signed)
 Status post pacemaker

## 2024-05-14 NOTE — ED Provider Notes (Signed)
 [place] EMERGENCY DEPARTMENT AT [facility] Provider Note   CSN: [id number] Arrival date & time: 05/14/24  9347     Patient presents with: Leg Swelling   Catherine [name] is a 89 y.o. female.   The history is provided by the patient and medical records. No language interpreter was used.  Shortness of Breath Severity:  Moderate Onset quality:  Gradual Duration:  1 week Timing:  Constant Progression:  Waxing and waning Chronicity:  Recurrent Context: not URI   Relieved by:  Position changes Worsened by:  Exertion Ineffective treatments:  None tried Associated symptoms: cough   Associated symptoms: no abdominal pain, no chest pain, no fever, no headaches, no neck pain, no rash, no sputum production, no vomiting and no wheezing        Prior to Admission medications   Medication Sig Start Date End Date Taking? Authorizing Provider  acetaminophen  (TYLENOL ) 500 MG tablet Take 500 mg by mouth every 6 (six) hours as needed for mild pain (pain score 1-3).    [provider]  albuterol  (PROVENTIL ) (2.5 MG/3ML) 0.083% nebulizer solution Take 3 mLs (2.5 mg total) by nebulization every 6 (six) hours as needed (during COPD Exacerbation). 02/15/24 02/14/25  Hope Almarie ORN, NP  albuterol  (VENTOLIN  HFA) 108 (90 Base) MCG/ACT inhaler INHALE 2 PUFFS INTO THE LUNGS EVERY 4 HOURS AS NEEDED FOR AHEEZING OR SHORTNESS OF BREATH 02/21/24   Parrett, Tammy S, NP  apixaban  (ELIQUIS ) 2.5 MG TABS tablet TAKE ONE TABLET TWICE DAILY (START 09/18/23 OR AS DIRECTED) 10/24/23   Burchette, Wolm ORN, MD  bumetanide  (BUMEX ) 1 MG tablet Take 1 tablet (1 mg total) by mouth daily as needed (for worsening lower extremity swelling, weight gain 2 to 3 lbs in 24 hrs or 5 lbs in 7 days). 05/09/24   Swinyer, Rosaline CHRISTELLA, NP  cholecalciferol (VITAMIN D3) 25 MCG (1000 UNIT) tablet Take 1,000 Units by mouth daily.    [provider]  diazepam  (VALIUM ) 5 MG tablet TAKE ONE TABLET BY MOUTH AT BEDTIME  12/12/23   Burchette, Wolm ORN, MD  empagliflozin  (JARDIANCE ) 10 MG TABS tablet Take 1 tablet (10 mg total) by mouth daily. 05/09/24   Swinyer, Rosaline CHRISTELLA, NP  estradiol  (ESTRACE ) 0.1 MG/GM vaginal cream Place 1 Applicatorful vaginally See admin instructions. Apply a small amount vaginally three nights a week on Monday, Wednesday and Friday.    [provider]  feeding supplement (ENSURE PLUS HIGH PROTEIN) LIQD Take 237 mLs by mouth 2 (two) times daily between meals. 04/25/24   Arrien, Mauricio Daniel, MD  fluticasone  (FLONASE ) 50 MCG/ACT nasal spray Place 1 spray into both nostrils daily as needed for allergies or rhinitis. 08/30/22   Sood, Vineet, MD  guaiFENesin  (MUCINEX ) 600 MG 12 hr tablet Take 1 tablet (600 mg total) by mouth 2 (two) times daily as needed for to loosen phlegm or cough. 04/25/24   Arrien, Mauricio Daniel, MD  HYDROcodone -acetaminophen  (NORCO/VICODIN) 5-325 MG tablet TAKE ONE TABLET EVERY 8 HOURS AS NEEDED FOR PAIN 05/12/24   Burchette, Wolm ORN, MD  midodrine  (PROAMATINE ) 5 MG tablet Take 1 tablet (5 mg total) by mouth 2 (two) times daily with a meal. 05/09/24   Swinyer, Rosaline CHRISTELLA, NP  Multiple Vitamin (MULTIVITAMIN WITH MINERALS) TABS tablet Take 1 tablet by mouth daily. 04/26/24   Arrien, Mauricio Daniel, MD  mupirocin  ointment (BACTROBAN ) 2 % APPLY TOPICALLY ONCE A DAY 03/16/24   Burchette, Wolm ORN, MD  ondansetron  (ZOFRAN ) 4 MG tablet Take 1 tablet (  4 mg total) by mouth every 8 (eight) hours as needed for nausea or vomiting. 04/25/24   Arrien, Elidia Sieving, MD    Allergies: Aldactone  Tiney ], Amoxil  Clotilda.Coffer ], Zebeta  Corrie.Cope ], Calan  [verapamil ], Crestor  [rosuvastatin ], Flovent  [fluticasone  propionate], Livalo  [pitavastatin ], Lyrica  [pregabalin ], Neurontin [gabapentin], Norvasc  [amlodipine ], Zetia  [ezetimibe ], Zocor  [simvastatin ], Lotensin [benazepril], and Polytrim  [polymyxin b -trimethoprim ]    Review of Systems  Constitutional:  Positive for fatigue.  Negative for chills and fever.  HENT:  Negative for congestion.   Respiratory:  Positive for cough and shortness of breath. Negative for sputum production, chest tightness, wheezing and stridor.   Cardiovascular:  Positive for leg swelling. Negative for chest pain and palpitations.  Gastrointestinal:  Positive for constipation. Negative for abdominal pain, diarrhea, nausea and vomiting.  Genitourinary:  Negative for dysuria and flank pain.  Musculoskeletal:  Negative for back pain, neck pain and neck stiffness.  Skin:  Negative for rash and wound.  Neurological:  Negative for headaches.  Psychiatric/Behavioral:  Negative for agitation.   All other systems reviewed and are negative.   Updated Vital Signs BP 116/69 (BP Location: Right Arm)   Pulse 75   Temp 98.1 F (36.7 C) (Oral)   Resp 18   SpO2 95%   Physical Exam Vitals and nursing note reviewed.  Constitutional:      General: She is not in acute distress.    Appearance: She is well-developed. She is not ill-appearing, toxic-appearing or diaphoretic.  HENT:     Head: Normocephalic and atraumatic.     Nose: No congestion or rhinorrhea.     Mouth/Throat:     Mouth: Mucous membranes are moist.     Pharynx: No oropharyngeal exudate or posterior oropharyngeal erythema.  Eyes:     Conjunctiva/sclera: Conjunctivae normal.  Cardiovascular:     Rate and Rhythm: Normal rate and regular rhythm.     Pulses: Normal pulses.     Heart sounds: Murmur heard.  Pulmonary:     Effort: No respiratory distress.     Breath sounds: Rhonchi and rales present. No wheezing.  Chest:     Chest wall: No tenderness.  Abdominal:     Palpations: Abdomen is soft.     Tenderness: There is no abdominal tenderness. There is no guarding or rebound.  Musculoskeletal:        General: Tenderness present. No swelling.     Cervical back: Neck supple. No tenderness.     Right lower leg: Edema present.     Left lower leg: Edema present.  Skin:    General:  Skin is warm and dry.     Capillary Refill: Capillary refill takes less than 2 seconds.     Findings: No erythema or rash.  Neurological:     General: No focal deficit present.     Mental Status: She is alert.     Sensory: No sensory deficit.     Motor: No weakness.  Psychiatric:        Mood and Affect: Mood normal.     (all labs ordered are listed, but only abnormal results are displayed) Labs Reviewed  CBC WITH DIFFERENTIAL/PLATELET - Abnormal; Notable for the following components:      Result Value   RBC 3.80 (*)    Hemoglobin 11.6 (*)    HCT 35.0 (*)    All other components within normal limits  COMPREHENSIVE METABOLIC PANEL WITH GFR - Abnormal; Notable for the following components:   Chloride 95 (*)    Glucose, Bld 118 (*)  BUN 33 (*)    Creatinine, Ser 1.34 (*)    Total Protein 5.4 (*)    Albumin  2.8 (*)    GFR, Estimated 38 (*)    All other components within normal limits  PRO BRAIN NATRIURETIC PEPTIDE - Abnormal; Notable for the following components:   Pro Brain Natriuretic Peptide 15,910.0 (*)    All other components within normal limits  TROPONIN T, HIGH SENSITIVITY - Abnormal; Notable for the following components:   Troponin T High Sensitivity 140 (*)    All other components within normal limits  TROPONIN T, HIGH SENSITIVITY - Abnormal; Notable for the following components:   Troponin T High Sensitivity 127 (*)    All other components within normal limits  RESP PANEL BY RT-PCR (RSV, FLU A&B, COVID)  RVPGX2  LACTIC ACID, PLASMA  LACTIC ACID, PLASMA    EKG: EKG Interpretation Date/Time:  Monday May 14 2024 07:33:25 EDT Ventricular Rate:  63 PR Interval:  64 QRS Duration:  97 QT Interval:  421 QTC Calculation: 448 R Axis:   94  Text Interpretation: A-V dual-paced rhythm with some inhibition No further analysis attempted due to paced rhythm when compared top rior, similar appearance No STEMI Confirmed by Ginger Barefoot ([id number]) on 05/14/2024 7:42:03  AM  Radiology: DG Chest 2 View Result Date: 05/14/2024 CLINICAL DATA:  Shortness of breath. Cough. Bilateral leg swelling. EXAM: CHEST - 2 VIEW COMPARISON:  04/24/2024 and CT chest 09/10/2023. FINDINGS: Heart is enlarged, as before. Thoracic aorta is calcified. Pacemaker lead tips are in the right atrium and right ventricle. Mild interstitial prominence and indistinctness with a moderate right pleural effusion and small left pleural effusion. IMPRESSION: Congestive heart failure. Electronically Signed   By: Newell Eke M.D.   On: 05/14/2024 08:18     Procedures   Medications Ordered in the ED - No data to display                                  Medical Decision Making Amount and/or Complexity of Data Reviewed Labs: ordered. Radiology: ordered.  Risk Decision regarding hospitalization.   Arbie KORALYN PRESTAGE is a 89 y.o. female with a past medical history significant for rheumatic heart disease, valvular heart disease status post mitral valve replacement, CHF with pacemaker implant, previous maze procedure, atrial fibrillation on Eliquis  therapy, aortic stenosis, COPD, asthma, hypertension, CKD, and CAD status post CABG who presents with worsening peripheral edema, shortness of breath, and fatigue.  According to patient, for the last week she has been having to change of her diuretic because her cardiology told her kidney function was worsened.  She had decreased her diuretic at their direction and now she is getting more swelling in her legs is worsening every day.  She is getting more winded and short of breath and while trying to sleep overnight get waking up short of breath.  She had a breathing treatment overnight and still could not catch her breath.  She has more fatigue that is exertional.  She denies any fevers or chills but does have a dry cough.  She denies nausea, vomiting, diarrhea or urinary changes.  She reports mild constipation.  Denies trauma.  Denies any chest pain or  abdominal pain.  She reports her legs are sore as they keep growing and stretching.  She said the last time she was admitted last month she had to have thoracentesis and is worried she may  need that again.  On exam, patient's breath sounds do have rales bilaterally and right lower lung is difficult to auscultate and diminished.  No significant wheezing.  She does have a murmur.  Abdomen nontender.  She has intact pulses but has edematous legs bilaterally.  EKG shows a paced rhythm.  Clinically I am concerned about her diuretic changes leading to worsening fluid overload as becoming more symptomatic.  Will get x-ray and labs however based on her oxygen  saturations that are getting down to about 90% while just talking with me, I anticipate patient may require admission as she does not take oxygen  at home.  She tells me she is post to have a cardiac procedure in the several weeks and is worried about that as well.  Anticipate reassessment after workup to determine disposition.  9:48 AM When compared to prior workup, proBNP has risen as has troponin.   CBC shows improved anemia from prior.  Lactic acid normal.  Metabolic panel shows slightly worsening creatinine.  She does not have COVID flu RSV and x-ray shows a moderate right pleural effusion and evidence of heart failure.  Will call for admission for further management of fluid overload.  Patient may require thoracentesis during this admission again but will defer to admitting medicine team.  12:10 PM Medicine called back and will admit for further management.  Anticipate cardiology involvement as well when they call back and we can discuss the case.     Final diagnoses:  Peripheral edema  Shortness of breath     Clinical Impression: 1. Peripheral edema   2. Shortness of breath     Disposition: Admit  This note was prepared with assistance of Dragon voice recognition software. Occasional wrong-word or sound-a-like substitutions may  have occurred due to the inherent limitations of voice recognition software.      Isaly Fasching, Lonni PARAS, MD 05/14/24 1213

## 2024-05-15 ENCOUNTER — Telehealth: Payer: Self-pay

## 2024-05-15 DIAGNOSIS — I5033 Acute on chronic diastolic (congestive) heart failure: Secondary | ICD-10-CM | POA: Diagnosis not present

## 2024-05-15 DIAGNOSIS — I509 Heart failure, unspecified: Secondary | ICD-10-CM | POA: Diagnosis not present

## 2024-05-15 LAB — TROPONIN I (HIGH SENSITIVITY)
Troponin I (High Sensitivity): 158 ng/L (ref <18)
Troponin I (High Sensitivity): 181 ng/L (ref <18)
Troponin I (High Sensitivity): 187 ng/L (ref <18)
Troponin I (High Sensitivity): 189 ng/L (ref <18)

## 2024-05-15 LAB — CBC
HCT: 34.6 % — ABNORMAL LOW (ref 36.0–46.0)
Hemoglobin: 11.5 g/dL — ABNORMAL LOW (ref 12.0–15.0)
MCH: 30.5 pg (ref 26.0–34.0)
MCHC: 33.2 g/dL (ref 30.0–36.0)
MCV: 91.8 fL (ref 80.0–100.0)
Platelets: 141 K/uL — ABNORMAL LOW (ref 150–400)
RBC: 3.77 MIL/uL — ABNORMAL LOW (ref 3.87–5.11)
RDW: 14 % (ref 11.5–15.5)
WBC: 6.6 K/uL (ref 4.0–10.5)
nRBC: 0 % (ref 0.0–0.2)

## 2024-05-15 LAB — IRON AND TIBC
Iron: 42 ug/dL (ref 28–170)
Saturation Ratios: 15 % (ref 10.4–31.8)
TIBC: 279 ug/dL (ref 250–450)
UIBC: 237 ug/dL

## 2024-05-15 LAB — COMPREHENSIVE METABOLIC PANEL WITH GFR
ALT: 14 U/L (ref 0–44)
AST: 28 U/L (ref 15–41)
Albumin: 2 g/dL — ABNORMAL LOW (ref 3.5–5.0)
Alkaline Phosphatase: 60 U/L (ref 38–126)
Anion gap: 10 (ref 5–15)
BUN: 29 mg/dL — ABNORMAL HIGH (ref 8–23)
CO2: 31 mmol/L (ref 22–32)
Calcium: 9.4 mg/dL (ref 8.9–10.3)
Chloride: 98 mmol/L (ref 98–111)
Creatinine, Ser: 1.16 mg/dL — ABNORMAL HIGH (ref 0.44–1.00)
GFR, Estimated: 46 mL/min — ABNORMAL LOW (ref >60)
Glucose, Bld: 102 mg/dL — ABNORMAL HIGH (ref 70–99)
Potassium: 3.7 mmol/L (ref 3.5–5.1)
Sodium: 139 mmol/L (ref 135–145)
Total Bilirubin: 0.6 mg/dL (ref 0.0–1.2)
Total Protein: 4.9 g/dL — ABNORMAL LOW (ref 6.5–8.1)

## 2024-05-15 LAB — RETICULOCYTES
Immature Retic Fract: 10.6 % (ref 2.3–15.9)
RBC.: 3.78 MIL/uL — ABNORMAL LOW (ref 3.87–5.11)
Retic Count, Absolute: 57.1 K/uL (ref 19.0–186.0)
Retic Ct Pct: 1.5 % (ref 0.4–3.1)

## 2024-05-15 LAB — FOLATE: Folate: >40 ng/mL (ref >5.9)

## 2024-05-15 LAB — FERRITIN: Ferritin: 99 ng/mL (ref 11–307)

## 2024-05-15 LAB — T4, FREE: Free T4: 0.83 ng/dL (ref 0.61–1.12)

## 2024-05-15 LAB — PREALBUMIN: Prealbumin: 14 mg/dL — ABNORMAL LOW (ref 18–38)

## 2024-05-15 LAB — MAGNESIUM: Magnesium: 2.3 mg/dL (ref 1.7–2.4)

## 2024-05-15 LAB — PHOSPHORUS: Phosphorus: 4.3 mg/dL (ref 2.5–4.6)

## 2024-05-15 LAB — VITAMIN B12: Vitamin B-12: 496 pg/mL (ref 180–914)

## 2024-05-15 MED ORDER — BOOST / RESOURCE BREEZE PO LIQD CUSTOM
1.0000 | Freq: Two times a day (BID) | ORAL | Status: DC
  Administered 2024-05-15 – 2024-05-16 (×3): 1 via ORAL
  Administered 2024-05-16: 237 mL via ORAL
  Administered 2024-05-18 – 2024-05-24 (×10): 1 via ORAL

## 2024-05-15 MED ORDER — ADULT MULTIVITAMIN W/MINERALS CH
1.0000 | ORAL_TABLET | Freq: Every day | ORAL | Status: DC
  Administered 2024-05-15 – 2024-05-24 (×9): 1 via ORAL
  Filled 2024-05-15 (×9): qty 1

## 2024-05-15 MED ORDER — ZINC SULFATE 220 (50 ZN) MG PO CAPS
220.0000 mg | ORAL_CAPSULE | Freq: Every day | ORAL | Status: DC
  Administered 2024-05-15 – 2024-05-24 (×9): 220 mg via ORAL
  Filled 2024-05-15 (×9): qty 1

## 2024-05-15 MED ORDER — APIXABAN 2.5 MG PO TABS
2.5000 mg | ORAL_TABLET | Freq: Two times a day (BID) | ORAL | Status: DC
Start: 2024-05-15 — End: 2024-05-24
  Administered 2024-05-15 – 2024-05-24 (×17): 2.5 mg via ORAL
  Filled 2024-05-15 (×17): qty 1

## 2024-05-15 NOTE — Progress Notes (Signed)
  Progress Note  Patient Name: Catherine [name] Date of Encounter: 05/15/2024 [facility] Cardiologist: Annabella Scarce, MD   Interval Summary   Reports feeling much better this morning  Breathing not back to baseline Swelling slightly improved Reports good urine output with Lasix  No acute complaints   Vital Signs Vitals:   05/14/24 2357 05/15/24 0416 05/15/24 0500 05/15/24 0801  BP: 110/73 92/61    Pulse: 81 (!) 59    Resp: 16 18    Temp: 97.6 F (36.4 C) 98.3 F (36.8 C)    TempSrc: Oral Oral    SpO2: 96% 98%  99%  Weight:   57.3 kg   Height:        Intake/Output Summary (Last 24 hours) at 05/15/2024 0835 Last data filed at 05/15/2024 0600 Gross per 24 hour  Intake 240 ml  Output 1050 ml  Net -810 ml      05/15/2024    5:00 AM 05/14/2024    6:12 PM 05/07/2024    1:07 PM  Last 3 Weights  Weight (lbs) 126 lb 4.8 oz 127 lb 11.2 oz 122 lb  Weight (kg) 57.289 kg 57.924 kg 55.339 kg     Telemetry/ECG  AV paced, HR 60s - Personally Reviewed  Physical Exam  GEN: No acute distress.   Neck: No JVD Cardiac: RRR, no murmurs, rubs, or gallops.  Respiratory: diminished breath sounds at right base. GI: Soft, nontender, non-distended  MS: 2+ bilateral LE edema  Assessment & Plan   Acute on chronic HFpEF, NYHA IV  Hypertension  Presented with worsening LE edema, shortness of breath, cough x 1 week  CXR showed bilateral pleural effusion, pulmonary vascular congestion  Echo 03/2024 showed EF 70-75% BNP 15,910 Urine output 1 L yesterday  Reports improvement in symptoms  Creatinine trending down this AM, 1.34 ? 1.16 Most recent BP 92/61 Currently on IV Lasix  80 mg BID, responding well  Continue Jardiance  10 mg  Renal function limited GDMT, intolerant to beta-blockers  Currently requiring midodrine  5 mg BID   CAD s/p CABG x 1, 2018 Troponin level 159 ? 158 ? 181 ? 187   No active chest pain  Suspect secondary to demand ischemia  Not on ASA due to Eliquis  use   Intolerant to beta-blockers, statins  No plans for ischemic evaluation at this time   S/p bioprostehtic MVR, 2018 Stable per echo 03/2024   Sick sinus syndrome  Junctional tachycardia  S/p PPM NSVT Paroxysmal atrial fibrillation Rates remaining in the 60s Planned SVT ablation in September  Pacemaker functioning appropriately  No current chest pain  Intolerant to beta-blockers  Plan to proceed with SVT ablation in September  Continue Eliquis  5 mg BID   Per primary  Nephrotic syndrome  Hypoalbuminemia Recent weight loss, decreased appetite  CKD 3a COPD Pleural effusions    For questions or updates, please contact [facility] Please consult [email or web address] for contact info under       Signed, Waddell DELENA Donath, PA-C

## 2024-05-15 NOTE — Progress Notes (Signed)
 Mobility Specialist Progress Note;   05/15/24 1109  Mobility  Activity Ambulated with assistance (bathroom to chair)  Level of Assistance Contact guard assist, steadying assist  Assistive Device None  Distance Ambulated (ft) 8 ft  Activity Response Tolerated well  Mobility Referral Yes  Mobility visit 1 Mobility  Mobility Specialist Start Time (ACUTE ONLY) 1109  Mobility Specialist Stop Time (ACUTE ONLY) 1114  Mobility Specialist Time Calculation (min) (ACUTE ONLY) 5 min   Answered pts call light requesting assistance from BR. Required little to no physical assistance. Deferred hallway ambulation d/t already ambulating w/ PT. Ambulated back to chair and left with all needs met, call bell in reach.   Lauraine Erm Mobility Specialist Please contact via SecureChat or [employer] [phone]

## 2024-05-15 NOTE — Progress Notes (Signed)
 Heart Failure Navigator Progress Note  Assessed for Heart & Vascular TOC clinic readiness.  Patient does not meet criteria due to EF 70-75%, Plans for a SVT Ablation in September. Has a scheduled [facility] appointment on 06/04/2024, No HF TOC. .   Navigator will sign off at this time.   Stephane Haddock, BSN, Scientist, clinical (histocompatibility and immunogenetics) Only

## 2024-05-15 NOTE — Hospital Course (Addendum)
 Catherine [name] is a 89 y.o. female with a history of CAD s/p CABG, rheumatic valvular disease s/p bioprosthetic valve replacement, PE, Afib, symptomatic bradycardia s/p pacemaker, chronic HFpEF, venous insufficiency, hypertension, hyperlipidemia, CKD stage III, COPD, asthma.  Patient presented secondary to leg swelling and dyspnea, found to have evidence of acute heart failure. IV diuresis started.SABRA

## 2024-05-15 NOTE — Plan of Care (Signed)
  Problem: Clinical Measurements: Goal: Respiratory complications will improve Outcome: Progressing Goal: Cardiovascular complication will be avoided Outcome: Progressing   Problem: Elimination: Goal: Will not experience complications related to urinary retention Outcome: Progressing   Problem: Safety: Goal: Ability to remain free from injury will improve Outcome: Progressing   Problem: Activity: Goal: Capacity to carry out activities will improve Outcome: Progressing

## 2024-05-15 NOTE — Progress Notes (Signed)
 Initial Nutrition Assessment  DOCUMENTATION CODES:   Severe malnutrition in context of chronic illness  INTERVENTION:  -Recommend liberalizing to 2g Na+ menu -Add Ensure Plus High Protein BID, Boost BID as pt likes to mix them -Add Magic Cup BID -Add MVI w/min -Daily weights -Zinc  lab resulted as deficient last admission, added Zinc  Sulfate daily   NUTRITION DIAGNOSIS:   Severe Malnutrition related to chronic illness, poor appetite as evidenced by severe muscle depletion, severe fat depletion, energy intake < 75% for > or equal to 3 months, edema.  GOAL:   Patient will meet greater than or equal to 90% of their needs  MONITOR:   PO intake, Weight trends, Skin, Labs, Supplement acceptance  REASON FOR ASSESSMENT:   Consult Assessment of nutrition requirement/status  ASSESSMENT:   Hx CAD status post CABG status post CABG, rheumatic valvular disease with bioprosthetic MVR in 2018, PE A-fib status post maze on Eliquis  symptomatic bradycardia status post pacemaker in 2024 NSVT chronic diastolic CHF venous insufficiency moderate TR aortic stenosis, HTN, HLD CKD 3, COPD, spontaneous pneumothorax, chronic back pain, asthma. Recently admitted to [facility]. Re-admit with SOB, edema, constipation.  Spoke to pt at chairside. Pt denies current n/v/c/d or chewing/swallowing difficulties. Last BM 8/17. Pt continues with variable appetite, though, has been trying to increase PO intake since last admission. Pt has been eating better overall. Pt with no documented PO intake yet, though, does state breakfast went well this morning. Continue to encourage increase kcal/pro intake, discuss importance of adequate intake. Pt verbalizes understanding. Add Ensure Plus + Boost Breeze BID as pt likes to mix them; if successful that provides 1200 kcal, 58 g protein just from ONS. +MVI, +Zinc  as pt was zinc  deficient when assessed last admission. Pt with hx of poor po intake/poor appetite since pacemaker placement in  Dec 2024. Pt returns with mod/severe BLE edema, states cardiologist recommended nephrologist referral to assess kidney function.Pt denies additional questions/concerns at this time, will continue to monitor, RDN available prn.   Labs BG 102 BUN 29 Cr 1.16 Albumin  2.0 Prealbumin 14 GFR 46 H/H 11.5/34.6 TSH 5.039  Medications  apixaban   2.5 mg Oral BID   diazepam   5 mg Oral QHS   docusate sodium   100 mg Oral BID   empagliflozin   10 mg Oral q AM   feeding supplement  1 Container Oral BID BM   feeding supplement  237 mL Oral BID BM   furosemide   80 mg Intravenous BID   guaiFENesin   600 mg Oral BID   ipratropium-albuterol   3 mL Nebulization BID   midodrine   5 mg Oral BID WC   multivitamin with minerals  1 tablet Oral Daily   senna  1 tablet Oral BID   sodium chloride  flush  3 mL Intravenous Q12H   zinc  sulfate (50mg  elemental zinc )  220 mg Oral Daily     NUTRITION - FOCUSED PHYSICAL EXAM:  Flowsheet Row Most Recent Value  Orbital Region Moderate depletion  Upper Arm Region Severe depletion  Thoracic and Lumbar Region Severe depletion  Buccal Region Moderate depletion  Temple Region Severe depletion  Clavicle Bone Region Severe depletion  Clavicle and Acromion Bone Region Severe depletion  Scapular Bone Region Severe depletion  Dorsal Hand Severe depletion  Patellar Region Unable to assess  [BLE Mod/Severe Edema]  Anterior Thigh Region Unable to assess  [BLE Mod/Severe Edema]  Posterior Calf Region Unable to assess  [BLE Mod/Severe Edema]  Edema (RD Assessment) Severe  Hair Reviewed  Eyes Reviewed  Mouth Reviewed  Skin Reviewed  Nails Reviewed    Diet Order:   Diet Order             Diet 2 gram sodium Room service appropriate? Yes with Assist; Fluid consistency: Thin  Diet effective now                   EDUCATION NEEDS:   Education needs have been addressed  Skin:  Skin Assessment: Reviewed RN Assessment  Last BM:  8/17  Height:   Ht Readings  from Last 1 Encounters:  05/14/24 5' 3 (1.6 m)    Weight:   Wt Readings from Last 1 Encounters:  05/15/24 57.3 kg    BMI:  Body mass index is 22.37 kg/m.  Estimated Nutritional Needs:   Kcal:  1425-1700 kcal  Protein:  70-85 g  Fluid:  1.5L  Minyon Billiter Daml-Budig, RDN, LDN Registered Dietitian Nutritionist RD Inpatient Contact Info in [place]

## 2024-05-15 NOTE — Progress Notes (Signed)
 PROGRESS NOTE    Catherine [name]  FMW:[id number] DOB: November 27, 1934 DOA: 05/14/2024 PCP: Micheal Wolm ORN, MD   Brief Narrative: Catherine [name] is a 89 y.o. female with a history of CAD s/p CABG, rheumatic valvular disease s/p bioprosthetic valve replacement, PE, Afib, symptomatic bradycardia s/p pacemaker, chronic HFpEF, venous insufficiency, hypertension, hyperlipidemia, CKD stage III, COPD, asthma.  Patient presented secondary to leg swelling and dyspnea, found to have evidence of acute heart failure. IV diuresis started..   Assessment and Plan:  Acute on chronic diastolic heart failure Present on admission. Pulmonary edema noted on chest x-ray. Pro BNP of 15,910, up slightly from 14,671 from July. Cardiology consulted. Lasix  IV diuresis started. Patient improving. Last Transthoracic Echocardiogram from July 2025 significant for LVEF of 70-75% with indeterminate diastolic parameters. -Cardiology recommendations: continued IV diuresis -Strict in/out -Daily weights  Atrial fibrillation Noted. Patient is on Eliquis . Not on rate or rhythm control. -Continue Eliquis  (dose reduced)  Pleural effusion Noted on imaging. Secondary to heart failure. -Management with treatment of heart failure  History of severe mitral valve stenosis s/p mitral valve replacement with bioprosthetic valve Noted.  Chronic hypotension Patient is on midodrine  as an outpatient. -Continue midodrine   CKD stage IIIa Baseline creatinine is around 1.2-1.4. Stable.  COPD -Continue Duoneb/albuterol   CAD No chest pain. Not on aspirin , statin, or beta blocker.  Severe malnutrition Noted.  Rheumatic heart disease Noted. Stable.  Sick sinus syndrome S/p pacemaker.    DVT prophylaxis: Eliquis  Code Status:   Code Status: Limited: Do not attempt resuscitation (DNR) -DNR-LIMITED -Do Not Intubate/DNI  Family Communication: None at bedside Disposition Plan: Discharge pending ongoing cardiology  recommendations/management   Consultants:  Cardiology  Procedures:  None  Antimicrobials: None    Subjective: Feeling better today. Still with dyspnea, but this has improved.  Objective: BP (!) 103/53 (BP Location: Right Arm)   Pulse 62   Temp 97.8 F (36.6 C) (Oral)   Resp 16   Ht 5' 3 (1.6 m)   Wt 57.3 kg   SpO2 100%   BMI 22.37 kg/m   Examination:  General exam: Appears calm and comfortable Respiratory system: Clear to auscultation. Respiratory effort normal. Cardiovascular system: S1, S2 and S3 heard, RRR. Gastrointestinal system: Abdomen is nondistended, soft and nontender. No organomegaly or masses felt. Normal bowel sounds heard. Central nervous system: Alert and oriented. No focal neurological deficits. Musculoskeletal: 2+ BLE edema. No calf tenderness Psychiatry: Judgement and insight appear normal. Mood & affect appropriate.    Data Reviewed: I have personally reviewed following labs and imaging studies  CBC Lab Results  Component Value Date   WBC 6.6 05/15/2024   RBC 3.77 (L) 05/15/2024   RBC 3.78 (L) 05/15/2024   HGB 11.5 (L) 05/15/2024   HCT 34.6 (L) 05/15/2024   MCV 91.8 05/15/2024   MCH 30.5 05/15/2024   PLT 141 (L) 05/15/2024   MCHC 33.2 05/15/2024   RDW 14.0 05/15/2024   LYMPHSABS 1.1 05/14/2024   MONOABS 0.9 05/14/2024   EOSABS 0.1 05/14/2024   BASOSABS 0.0 05/14/2024     Last metabolic panel Lab Results  Component Value Date   NA 139 05/15/2024   K 3.7 05/15/2024   CL 98 05/15/2024   CO2 31 05/15/2024   BUN 29 (H) 05/15/2024   CREATININE 1.16 (H) 05/15/2024   GLUCOSE 102 (H) 05/15/2024   GFRNONAA 46 (L) 05/15/2024   GFRAA 67 10/29/2020   CALCIUM  9.4 05/15/2024   PHOS 4.3 05/15/2024   PROT 4.9 (  L) 05/15/2024   ALBUMIN  2.0 (L) 05/15/2024   LABGLOB 2.2 04/23/2024   AGRATIO 1.1 04/23/2024   BILITOT 0.6 05/15/2024   ALKPHOS 60 05/15/2024   AST 28 05/15/2024   ALT 14 05/15/2024   ANIONGAP 10 05/15/2024     GFR: Estimated Creatinine Clearance: 28.8 mL/min (A) (by C-G formula based on SCr of 1.16 mg/dL (H)).  Recent Results (from the past 240 hours)  Resp panel by RT-PCR (RSV, Flu A&B, Covid) Anterior Nasal Swab     Status: None   Collection Time: 05/14/24  7:31 AM   Specimen: Anterior Nasal Swab  Result Value Ref Range Status   SARS Coronavirus 2 by RT PCR NEGATIVE NEGATIVE Final    Comment: (NOTE) SARS-CoV-2 target nucleic acids are NOT DETECTED.  The SARS-CoV-2 RNA is generally detectable in upper respiratory specimens during the acute phase of infection. The lowest concentration of SARS-CoV-2 viral copies this assay can detect is 138 copies/mL. A negative result does not preclude SARS-Cov-2 infection and should not be used as the sole basis for treatment or other patient management decisions. A negative result may occur with  improper specimen collection/handling, submission of specimen other than nasopharyngeal swab, presence of viral mutation(s) within the areas targeted by this assay, and inadequate number of viral copies(<138 copies/mL). A negative result must be combined with clinical observations, patient history, and epidemiological information. The expected result is Negative.  Fact Sheet for Patients:  BloggerCourse.com  Fact Sheet for Healthcare Providers:  SeriousBroker.it  This test is no t yet approved or cleared by the [place]  FDA and  has been authorized for detection and/or diagnosis of SARS-CoV-2 by FDA under an Emergency Use Authorization (EUA). This EUA will remain  in effect (meaning this test can be used) for the duration of the COVID-19 declaration under Section 564(b)(1) of the Act, 21 U.S.C.section 360bbb-3(b)(1), unless the authorization is terminated  or revoked sooner.       Influenza A by PCR NEGATIVE NEGATIVE Final   Influenza B by PCR NEGATIVE NEGATIVE Final    Comment: (NOTE) The  Xpert Xpress SARS-CoV-2/FLU/RSV plus assay is intended as an aid in the diagnosis of influenza from Nasopharyngeal swab specimens and should not be used as a sole basis for treatment. Nasal washings and aspirates are unacceptable for Xpert Xpress SARS-CoV-2/FLU/RSV testing.  Fact Sheet for Patients: BloggerCourse.com  Fact Sheet for Healthcare Providers: SeriousBroker.it  This test is not yet approved or cleared by the [place]  FDA and has been authorized for detection and/or diagnosis of SARS-CoV-2 by FDA under an Emergency Use Authorization (EUA). This EUA will remain in effect (meaning this test can be used) for the duration of the COVID-19 declaration under Section 564(b)(1) of the Act, 21 U.S.C. section 360bbb-3(b)(1), unless the authorization is terminated or revoked.     Resp Syncytial Virus by PCR NEGATIVE NEGATIVE Final    Comment: (NOTE) Fact Sheet for Patients: BloggerCourse.com  Fact Sheet for Healthcare Providers: SeriousBroker.it  This test is not yet approved or cleared by the [place]  FDA and has been authorized for detection and/or diagnosis of SARS-CoV-2 by FDA under an Emergency Use Authorization (EUA). This EUA will remain in effect (meaning this test can be used) for the duration of the COVID-19 declaration under Section 564(b)(1) of the Act, 21 U.S.C. section 360bbb-3(b)(1), unless the authorization is terminated or revoked.  Performed at [facility], [address] [place], [place] [address]       Radiology Studies: DG Chest  2 View Result Date: 05/14/2024 CLINICAL DATA:  Shortness of breath. Cough. Bilateral leg swelling. EXAM: CHEST - 2 VIEW COMPARISON:  04/24/2024 and CT chest 09/10/2023. FINDINGS: Heart is enlarged, as before. Thoracic aorta is calcified. Pacemaker lead tips are in the right atrium and right ventricle.  Mild interstitial prominence and indistinctness with a moderate right pleural effusion and small left pleural effusion. IMPRESSION: Congestive heart failure. Electronically Signed   By: Newell Eke M.D.   On: 05/14/2024 08:18      LOS: 1 day    Elgin Lam, MD [facility] 05/15/2024, 2:35 PM   If 7PM-7AM, please contact night-coverage [email or web address]

## 2024-05-15 NOTE — Evaluation (Signed)
 Physical Therapy Brief Evaluation and Discharge Note Patient Details Name: Catherine [name] MRN: [id number] DOB: September 14, 1935 Today's Date: 05/15/2024   History of Present Illness  89 y.o. female adm 05/14/24 for LB edema, dyspnea, CHF exacerbation. PMH: Afib on Eliquis , PPM, COPD, MVR, CAD s/p CABG, HTN, HLD, HFpEF, chronic back pain with past surgeries, CKD  Clinical Impression  Pt very pleasant and reports frustration over repeated admissions and lack of energy at baseline. Pt enjoys going out with friends and working in the yard but has been unable to complete these tasks this year. Pt states compliance with daily weights and diet. Pt able to complete walking 5 min this session with standing rests, HR 84 and SPO2 96% on RA. Pt educated for walking program daily with progression to 20 min and encouraged activity, pt denied need for rollator to assist with energy conservation and progression. Pt at baseline with education complete. Encouraged OOB for meals and continued daily ambulation acutely. Will sign off with pt aware and agreeable.       PT Assessment Patient does not need any further PT services  Assistance Needed at Discharge  PRN    Equipment Recommendations None recommended by PT  Recommendations for Other Services       Precautions/Restrictions Precautions Precautions: Fall Recall of Precautions/Restrictions: Intact        Mobility  Bed Mobility Rolling: Modified independent (Device/Increase time)     General bed mobility comments: pt able to transition to sitting with HOB 20 degrees without rail  Transfers Overall transfer level: Modified independent                      Ambulation/Gait Ambulation/Gait assistance: Modified independent (Device/Increase time) Gait Distance (Feet): 350 Feet Assistive device: None Gait Pattern/deviations: Step-through pattern, Decreased stride length Gait Speed: Below normal General Gait Details: grossly 5 min to complete  gait, 2 periods of slight sway with pt able to recover without assist, 2 standing rests  Home Activity Instructions    Stairs            Modified Rankin (Stroke Patients Only)        Balance Overall balance assessment: Mild deficits observed, not formally tested   Sitting balance-Leahy Scale: Normal       Standing balance-Leahy Scale: Good            Pertinent Vitals/Pain PT - Brief Vital Signs All Vital Signs Stable: Yes Pain Assessment Pain Assessment: No/denies pain     Home Living   Living Arrangements: Spouse/significant other       Home Equipment: Rolling Walker (2 wheels);Cane - single point;Shower seat        Prior Function        UE/LE Assessment   UE ROM/Strength/Tone/Coordination: WFL    LE ROM/Strength/Tone/Coordination: [facility]      Communication   Communication Communication: No apparent difficulties     Cognition Overall Cognitive Status: Appears within functional limits for tasks assessed/performed       General Comments      Exercises     Assessment/Plan    PT Problem List Decreased balance;Decreased activity tolerance       PT Visit Diagnosis Other abnormalities of gait and mobility (R26.89)    No Skilled PT Patient at baseline level of functioning   Co-evaluation                AMPAC 6 Clicks Help needed turning from your back to your side while in  a flat bed without using bedrails?: None Help needed moving from lying on your back to sitting on the side of a flat bed without using bedrails?: None Help needed moving to and from a bed to a chair (including a wheelchair)?: None Help needed standing up from a chair using your arms (e.g., wheelchair or bedside chair)?: None Help needed to walk in hospital room?: A Little Help needed climbing 3-5 steps with a railing? : A Little 6 Click Score: 22      End of Session   Activity Tolerance: Patient tolerated treatment well Patient left: in chair;with call  bell/phone within reach Nurse Communication: Mobility status PT Visit Diagnosis: Other abnormalities of gait and mobility (R26.89)     Time: 0936-1000 PT Time Calculation (min) (ACUTE ONLY): 24 min  Charges:   PT Evaluation $PT Eval Low Complexity: 1 Low PT Treatments $Gait Training: 8-22 mins    Lenoard SQUIBB, PT Acute Rehabilitation Services Office: [phone]   Lenoard NOVAK Gladie Gravette  05/15/2024, 10:36 AM

## 2024-05-15 NOTE — Progress Notes (Signed)
 Occupational Therapy Discharge/ screen Patient Details Name: Catherine [name] MRN: [id number] DOB: 23-Dec-1934 Today's Date: 05/15/2024 Time:  -     Patient discharged from OT services secondary to no acute needs. (Screen via PT Maija and discussed)  Please see latest therapy progress note for current level of functioning and progress toward goals.    Progress and discharge plan discussed with patient and/or caregiver: Patient/Caregiver agrees with plan  GO     Ely Molt 05/15/2024, 10:49 AM

## 2024-05-16 DIAGNOSIS — I509 Heart failure, unspecified: Secondary | ICD-10-CM | POA: Diagnosis not present

## 2024-05-16 DIAGNOSIS — N1831 Chronic kidney disease, stage 3a: Secondary | ICD-10-CM | POA: Diagnosis not present

## 2024-05-16 DIAGNOSIS — I5033 Acute on chronic diastolic (congestive) heart failure: Secondary | ICD-10-CM | POA: Diagnosis not present

## 2024-05-16 LAB — URINALYSIS, COMPLETE (UACMP) WITH MICROSCOPIC
Bacteria, UA: NONE SEEN
Bilirubin Urine: NEGATIVE
Glucose, UA: 50 mg/dL — AB
Hgb urine dipstick: NEGATIVE
Ketones, ur: NEGATIVE mg/dL
Leukocytes,Ua: NEGATIVE
Nitrite: NEGATIVE
Protein, ur: >=300 mg/dL — AB
Specific Gravity, Urine: 1.01 (ref 1.005–1.030)
pH: 7 (ref 5.0–8.0)

## 2024-05-16 LAB — CBC
HCT: 32.2 % — ABNORMAL LOW (ref 36.0–46.0)
Hemoglobin: 10.8 g/dL — ABNORMAL LOW (ref 12.0–15.0)
MCH: 30.4 pg (ref 26.0–34.0)
MCHC: 33.5 g/dL (ref 30.0–36.0)
MCV: 90.7 fL (ref 80.0–100.0)
Platelets: 138 K/uL — ABNORMAL LOW (ref 150–400)
RBC: 3.55 MIL/uL — ABNORMAL LOW (ref 3.87–5.11)
RDW: 13.6 % (ref 11.5–15.5)
WBC: 6.1 K/uL (ref 4.0–10.5)
nRBC: 0 % (ref 0.0–0.2)

## 2024-05-16 LAB — BASIC METABOLIC PANEL WITH GFR
Anion gap: 9 (ref 5–15)
BUN: 29 mg/dL — ABNORMAL HIGH (ref 8–23)
CO2: 31 mmol/L (ref 22–32)
Calcium: 8.7 mg/dL — ABNORMAL LOW (ref 8.9–10.3)
Chloride: 97 mmol/L — ABNORMAL LOW (ref 98–111)
Creatinine, Ser: 1.26 mg/dL — ABNORMAL HIGH (ref 0.44–1.00)
GFR, Estimated: 42 mL/min — ABNORMAL LOW (ref >60)
Glucose, Bld: 100 mg/dL — ABNORMAL HIGH (ref 70–99)
Potassium: 2.7 mmol/L — CL (ref 3.5–5.1)
Sodium: 137 mmol/L (ref 135–145)

## 2024-05-16 LAB — T3: T3, Total: 102 ng/dL (ref 71–180)

## 2024-05-16 LAB — PROTEIN / CREATININE RATIO, URINE
Creatinine, Urine: 32 mg/dL
Protein Creatinine Ratio: 17.94 mg/mg{creat} — ABNORMAL HIGH (ref 0.00–0.15)
Total Protein, Urine: 574 mg/dL

## 2024-05-16 MED ORDER — POTASSIUM CHLORIDE CRYS ER 20 MEQ PO TBCR
40.0000 meq | EXTENDED_RELEASE_TABLET | Freq: Once | ORAL | Status: AC
  Administered 2024-05-16: 40 meq via ORAL
  Filled 2024-05-16: qty 2

## 2024-05-16 MED ORDER — POTASSIUM CHLORIDE 10 MEQ/100ML IV SOLN
10.0000 meq | INTRAVENOUS | Status: AC
  Administered 2024-05-16 (×2): 10 meq via INTRAVENOUS
  Filled 2024-05-16 (×4): qty 100

## 2024-05-16 MED ORDER — METOLAZONE 5 MG PO TABS
5.0000 mg | ORAL_TABLET | Freq: Once | ORAL | Status: AC
  Administered 2024-05-16: 5 mg via ORAL
  Filled 2024-05-16: qty 1

## 2024-05-16 MED ORDER — POTASSIUM CHLORIDE CRYS ER 20 MEQ PO TBCR
40.0000 meq | EXTENDED_RELEASE_TABLET | ORAL | Status: AC
  Administered 2024-05-16 (×2): 40 meq via ORAL
  Filled 2024-05-16 (×2): qty 2

## 2024-05-16 MED ORDER — BACITRACIN-NEOMYCIN-POLYMYXIN OINTMENT TUBE
TOPICAL_OINTMENT | Freq: Two times a day (BID) | CUTANEOUS | Status: DC
  Administered 2024-05-20: 1 via TOPICAL
  Filled 2024-05-16: qty 14

## 2024-05-16 NOTE — Progress Notes (Signed)
 Mobility Specialist Progress Note;   05/16/24 1355  Mobility  Activity Ambulated independently  Level of Assistance Modified independent, requires aide device or extra time  Assistive Device None  Distance Ambulated (ft) 40 ft  Activity Response Tolerated well  Mobility Referral Yes  Mobility visit 1 Mobility  Mobility Specialist Start Time (ACUTE ONLY) 1355  Mobility Specialist Stop Time (ACUTE ONLY) 1404  Mobility Specialist Time Calculation (min) (ACUTE ONLY) 9 min   Pt agreeable to mobility. Required no physical assistance during ambulation. No c/o when asked. Session limited d/t husband and sister in law coming in to visit. Pt requested this MS come back later on, will f/u as able. Pt returned to room w/ all needs met.   Lauraine Erm Mobility Specialist Please contact via SecureChat or [employer] [phone]

## 2024-05-16 NOTE — Plan of Care (Signed)
  Problem: Clinical Measurements: Goal: Respiratory complications will improve Outcome: Progressing Goal: Cardiovascular complication will be avoided Outcome: Progressing   Problem: Activity: Goal: Risk for activity intolerance will decrease Outcome: Progressing   Problem: Nutrition: Goal: Adequate nutrition will be maintained Outcome: Progressing   Problem: Pain Managment: Goal: General experience of comfort will improve and/or be controlled Outcome: Progressing   Problem: Safety: Goal: Ability to remain free from injury will improve Outcome: Progressing   Problem: Activity: Goal: Capacity to carry out activities will improve Outcome: Progressing   Problem: Cardiac: Goal: Ability to achieve and maintain adequate cardiopulmonary perfusion will improve Outcome: Progressing

## 2024-05-16 NOTE — Progress Notes (Signed)
 Urine sample collected at 1705 and sent to lab at 1710.

## 2024-05-16 NOTE — Progress Notes (Signed)
 Thigh high compression stockings (XL) placed on patients bilateral lower extremities.   Hospital safety, non-slip, socks placed on patients feet.  Patient ambulated with steady gait in hallway with RN.

## 2024-05-16 NOTE — Progress Notes (Signed)
 Progress Note  Patient Name: Catherine [name] Date of Encounter: 05/16/2024 [facility] Cardiologist: Annabella Scarce, MD   Interval Summary   Not much improvement in LE edema Still reports shortness of breath with exertion Currently on room air, not short of breath at rest Receiving IV Lasix  80 mg BID, poor urine output  Creatinine trending up today  May need to consider metolazone   Patient says she was told that she was going to be seen by nephrology while she was here -- will inquire   Vital Signs Vitals:   05/15/24 2021 05/15/24 2026 05/16/24 0022 05/16/24 0506  BP: 116/68  (!) 98/51 (!) 98/54  Pulse: 99  60 81  Resp: 16  18 17   Temp: 98.1 F (36.7 C)  97.6 F (36.4 C) 97.7 F (36.5 C)  TempSrc: Oral  Oral Oral  SpO2: 95% 94% 93% 94%  Weight:    56.5 kg  Height:        Intake/Output Summary (Last 24 hours) at 05/16/2024 0853 Last data filed at 05/16/2024 0657 Gross per 24 hour  Intake 11.5 ml  Output 800 ml  Net -788.5 ml      05/16/2024    5:06 AM 05/15/2024    5:00 AM 05/14/2024    6:12 PM  Last 3 Weights  Weight (lbs) 124 lb 9.6 oz 126 lb 4.8 oz 127 lb 11.2 oz  Weight (kg) 56.518 kg 57.289 kg 57.924 kg     Telemetry/ECG  AV-paced, HR 70s - Personally Reviewed  Physical Exam  GEN: No acute distress.   Neck: mild JVD Cardiac: iRRR.  Respiratory: Clear bilaterally. GI: Soft, nontender, non-distended  MS: 2+ bilateral LE edema  Assessment & Plan  Acute on chronic HFpEF, NYHA IV  Hypertension  Presented with worsening LE edema, shortness of breath, cough x 1 week  CXR showed bilateral pleural effusion, pulmonary vascular congestion  Echo 03/2024 showed EF 70-75% BNP 15,910 Net - 1.5 L this admission, only 800 cc urine output yesterday Patient agrees that urine output likely accurate Still experiencing DOE and LE edema appears unchanged  Creatinine trending up this AM, 1.16 ? 1.26  Most recent BP 98/54, most BP low-normal Weight 124 lb, down  from 127 lb -- weight 121 lb on 7/30 when discharged  Previously had to take metolazone  with Bumex  at home  Currently on IV Lasix  80 mg BID -- may need to consider adding metolazone  or adjusting dose, with hypokalemia this AM would wait until K is stabilized before making significant adjustments  Continue Jardiance  10 mg  Renal function limited GDMT, intolerant to beta-blockers  Currently requiring midodrine  5 mg BID  Continue strict I&O's, daily weights, daily BMPs   CAD s/p CABG x 1, 2018 Troponin level 159 ? 158 ? 181 ? 187   No active chest pain  Suspect secondary to demand ischemia  Not on ASA due to Eliquis  use  Intolerant to beta-blockers, statins  No plans for ischemic evaluation at this time   Sick sinus syndrome  Junctional tachycardia  S/p PPM Paroxysmal atrial fibrillation SVT Remains rate controlled  Planned SVT ablation in September  Pacemaker functioning appropriately  No current chest pain  Intolerant to beta-blockers  Plan to proceed with SVT ablation in September  Continue Eliquis  2.5 mg BID   Hyperlipidemia  04/09/2024: HDL 42; LDL Chol Calc (NIH) 127 05/15/2024: ALT 14  Intolerant to statins  LDL uncontrolled on Leqvio    S/p bioprostehtic MVR, 2018 Stable per echo 03/2024  Per primary  Nephrotic syndrome  Hypoalbuminemia Recent weight loss, decreased appetite  CKD 3a COPD Pleural effusions Electrolyte disturbances    For questions or updates, please contact [facility] Please consult [email or web address] for contact info under       Signed, Waddell DELENA Donath, PA-C

## 2024-05-16 NOTE — Progress Notes (Signed)
   05/16/24 9493  Provider Notification  Provider Name/Title Dr Keturah  Date Provider Notified 05/16/24  Time Provider Notified [phone]  Method of Notification Page (page and secured chat)  Notification Reason Critical Result  Test performed and critical result Potassium 2.7  Date Critical Result Received 05/16/24  Time Critical Result Received 0503  Provider response See new orders  Date of Provider Response 05/16/24  Time of Provider Response [phone]

## 2024-05-16 NOTE — Progress Notes (Signed)
 PROGRESS NOTE    Catherine [name]  FMW:[id number] DOB: [birth year] DOA: 05/14/2024 PCP: Micheal Wolm ORN, MD   Brief Narrative:  Catherine [name] is a 89 y.o. 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  Patient presented secondary to leg swelling and dyspnea, found to have evidence of acute heart failure/volume overload. IV diuresis started hospitalist called for admission and cardiology called in consult.  Assessment & Plan:   Principal Problem:   Pleural effusion due to CHF (congestive heart failure) (HCC) Active Problems:   Atrial fibrillation (HCC)   S/P mitral valve replacement with bioprosthetic valve - Oct 14, 2016   Acute on chronic diastolic CHF (congestive heart failure) (HCC)   Essential hypertension   CKD stage 3a, GFR 45-59 ml/min (HCC)   Coronary artery disease   COPD  GOLD 2   Protein-calorie malnutrition, severe   Rheumatic heart disease   Sick sinus syndrome (HCC)  Acute on chronic diastolic heart failure - Poor urine output despite increasing diuretics, appreciate cardiology recommendations, metolazone  initiated -Follow urine output -Lower extremity edema ongoing, will attempt compression wrap and stockings in the next 48 hours if tolerated -Ongoing shortness of breath/dyspnea with exertion and orthopnea -Prior echo July 2025 with 70 to 75% EF with indeterminate diastolic parameters -Strict in/out -Daily weights   Atrial fibrillation, rate controlled -Continue low-dose Eliquis  secondary to age and weight   Pleural effusion In the setting of heart failure, continue diuretics as above   History of severe mitral valve stenosis s/p mitral valve replacement with bioprosthetic valve Noted.  Cardiology following.   Chronic hypotension Continue home midodrine ,  titrate as appropriate   CKD stage IIIa, at baseline -Baseline creatinine is around 1.2-1.4. Stable. - Creatinine currently at baseline, no indication for nephrology consult at this point, certainly if urine output does not improve despite increasing diuretics -expect transient increase in creatinine over the next 48 to 72 hours given increased diuretics -Would recommend ongoing outpatient follow-up as previously planned   COPD -Continue Duoneb/albuterol  CAD No chest pain. Not on aspirin , statin, or beta blocker. Severe malnutrition Noted. Rheumatic heart disease Noted. Stable. Sick sinus syndrome S/p pacemaker  DVT prophylaxis: apixaban  (ELIQUIS ) tablet 2.5 mg Start: 05/15/24 2200 Place TED hose Start: 05/14/24 2205 SCDs Start: 05/14/24 2012 apixaban  (ELIQUIS ) tablet 2.5 mg   Code Status:   Code Status: Limited: Do not attempt resuscitation (DNR) -DNR-LIMITED -Do Not Intubate/DNI   Family Communication: None present  Status is: Inpatient  Dispo: The patient is from: Home              Anticipated d/c is to: Pending clinical course              Anticipated d/c date is: To be determined likely 48 to 72 hours              Patient currently not medically stable for discharge given ongoing need for IV diuretics close monitoring in the setting of heart failure exacerbation  Consultants:  Cardiology  Procedures:  None planned  Antimicrobials:  None indicated  Subjective: No acute issues or events overnight, continues to complain of lower extremity edema, dyspnea with exertion and orthopnea.  No real change since admission.  Otherwise denies chest pain nausea vomiting diarrhea constipation headache fevers or chills.  Objective: Vitals:   05/15/24 2021 05/15/24 2026 05/16/24 0022 05/16/24 9493  BP: 116/68  (!) 98/51 (!) 98/54  Pulse: 99  60 81  Resp: 16  18 17   Temp: 98.1 F (36.7 C)  97.6 F (36.4 C) 97.7 F (36.5 C)  TempSrc: Oral  Oral Oral  SpO2: 95% 94% 93% 94%   Weight:    56.5 kg  Height:        Intake/Output Summary (Last 24 hours) at 05/16/2024 0852 Last data filed at 05/16/2024 0657 Gross per 24 hour  Intake 11.5 ml  Output 800 ml  Net -788.5 ml   Filed Weights   05/14/24 1812 05/15/24 0500 05/16/24 0506  Weight: 57.9 kg 57.3 kg 56.5 kg    Examination:  General:  Pleasantly resting in bed, No acute distress. HEENT:  Normocephalic atraumatic.  Sclerae nonicteric, noninjected.  Extraocular movements intact bilaterally. Neck:  Without mass or deformity.  Trachea is midline. Lungs:  Clear to auscultate bilaterally without rhonchi, wheeze, or rales. Heart:  Regular rate and rhythm.  Without murmurs, rubs, or gallops. Abdomen:  Soft, nontender, nondistended.  Without guarding or rebound. Extremities: 2+ pitting edema extending above the knee  Data Reviewed: I have personally reviewed following labs and imaging studies  CBC: Recent Labs  Lab 05/14/24 0731 05/15/24 0449 05/16/24 0426  WBC 9.1 6.6 6.1  NEUTROABS 7.1  --   --   HGB 11.6* 11.5* 10.8*  HCT 35.0* 34.6* 32.2*  MCV 92.1 91.8 90.7  PLT 153 141* 138*   Basic Metabolic Panel: Recent Labs  Lab 05/14/24 0731 05/15/24 0449 05/16/24 0426  NA 135 139 137  K 4.3 3.7 2.7*  CL 95* 98 97*  CO2 30 31 31   GLUCOSE 118* 102* 100*  BUN 33* 29* 29*  CREATININE 1.34* 1.16* 1.26*  CALCIUM  10.0 9.4 8.7*  MG  --  2.3  --   PHOS  --  4.3  --    GFR: Estimated Creatinine Clearance: 26.5 mL/min (A) (by C-G formula based on SCr of 1.26 mg/dL (H)). Liver Function Tests: Recent Labs  Lab 05/14/24 0731 05/15/24 0449  AST 33 28  ALT 13 14  ALKPHOS 84 60  BILITOT 0.3 0.6  PROT 5.4* 4.9*  ALBUMIN  2.8* 2.0*   BNP (last 3 results) Recent Labs    03/24/24 2240 04/15/24 1604 05/14/24 0731  PROBNP 5,712.0* 14,671.0* 15,910.0*   Thyroid  Function Tests: Recent Labs    05/14/24 2055 05/15/24 0006  TSH 5.039*  --   FREET4  --  0.83   Anemia Panel: Recent Labs     05/15/24 0449  VITAMINB12 496  FOLATE >40.0  FERRITIN 99  TIBC 279  IRON  42  RETICCTPCT 1.5   Sepsis Labs: Recent Labs  Lab 05/14/24 0727 05/14/24 0943  LATICACIDVEN 1.7 1.0    Recent Results (from the past 240 hours)  Resp panel by RT-PCR (RSV, Flu A&B, Covid) Anterior Nasal Swab     Status: None   Collection Time: 05/14/24  7:31 AM   Specimen: Anterior Nasal Swab  Result Value Ref Range Status   SARS Coronavirus 2 by RT PCR NEGATIVE NEGATIVE Final    Comment: (NOTE) SARS-CoV-2 target nucleic acids are NOT DETECTED.  The SARS-CoV-2 RNA is generally detectable in upper respiratory specimens during the acute phase of infection. The lowest concentration of SARS-CoV-2 viral copies this assay can detect is 138 copies/mL. A negative result does not preclude SARS-Cov-2 infection and should not be used as the sole basis for treatment or other patient management decisions. A negative result may  occur with  improper specimen collection/handling, submission of specimen other than nasopharyngeal swab, presence of viral mutation(s) within the areas targeted by this assay, and inadequate number of viral copies(<138 copies/mL). A negative result must be combined with clinical observations, patient history, and epidemiological information. The expected result is Negative.  Fact Sheet for Patients:  BloggerCourse.com  Fact Sheet for Healthcare Providers:  SeriousBroker.it  This test is no t yet approved or cleared by the [place]  FDA and  has been authorized for detection and/or diagnosis of SARS-CoV-2 by FDA under an Emergency Use Authorization (EUA). This EUA will remain  in effect (meaning this test can be used) for the duration of the COVID-19 declaration under Section 564(b)(1) of the Act, 21 U.S.C.section 360bbb-3(b)(1), unless the authorization is terminated  or revoked sooner.       Influenza A by PCR NEGATIVE  NEGATIVE Final   Influenza B by PCR NEGATIVE NEGATIVE Final    Comment: (NOTE) The Xpert Xpress SARS-CoV-2/FLU/RSV plus assay is intended as an aid in the diagnosis of influenza from Nasopharyngeal swab specimens and should not be used as a sole basis for treatment. Nasal washings and aspirates are unacceptable for Xpert Xpress SARS-CoV-2/FLU/RSV testing.  Fact Sheet for Patients: BloggerCourse.com  Fact Sheet for Healthcare Providers: SeriousBroker.it  This test is not yet approved or cleared by the [place]  FDA and has been authorized for detection and/or diagnosis of SARS-CoV-2 by FDA under an Emergency Use Authorization (EUA). This EUA will remain in effect (meaning this test can be used) for the duration of the COVID-19 declaration under Section 564(b)(1) of the Act, 21 U.S.C. section 360bbb-3(b)(1), unless the authorization is terminated or revoked.     Resp Syncytial Virus by PCR NEGATIVE NEGATIVE Final    Comment: (NOTE) Fact Sheet for Patients: BloggerCourse.com  Fact Sheet for Healthcare Providers: SeriousBroker.it  This test is not yet approved or cleared by the [place]  FDA and has been authorized for detection and/or diagnosis of SARS-CoV-2 by FDA under an Emergency Use Authorization (EUA). This EUA will remain in effect (meaning this test can be used) for the duration of the COVID-19 declaration under Section 564(b)(1) of the Act, 21 U.S.C. section 360bbb-3(b)(1), unless the authorization is terminated or revoked.  Performed at [facility], [address] [place], [place] [address]          Radiology Studies: No results found.      Scheduled Meds:  apixaban   2.5 mg Oral BID   diazepam   5 mg Oral QHS   docusate sodium   100 mg Oral BID   empagliflozin   10 mg Oral q AM   feeding supplement  1 Container Oral BID BM    feeding supplement  237 mL Oral BID BM   furosemide   80 mg Intravenous BID   guaiFENesin   600 mg Oral BID   midodrine   5 mg Oral BID WC   multivitamin with minerals  1 tablet Oral Daily   potassium chloride   40 mEq Oral Q4H   senna  1 tablet Oral BID   sodium chloride  flush  3 mL Intravenous Q12H   zinc  sulfate (50mg  elemental zinc )  220 mg Oral Daily   Continuous Infusions:  potassium chloride  10 mEq (05/16/24 0823)     LOS: 2 days   Time spent:  Elsie JAYSON Montclair, DO [facility]  If 7PM-7AM, please contact night-coverage [email or web address]  05/16/2024, 8:52 AM

## 2024-05-16 NOTE — Progress Notes (Addendum)
 Patient has ambulated in the hallway a minimum of four times during day-shift. Patient tolerates ambulation. Patient denies SOB and CP. Patient endorses feeling weak r/t deconditioning.

## 2024-05-17 ENCOUNTER — Encounter (HOSPITAL_COMMUNITY)
Admission: EM | Disposition: A | Discharge status: Home Health | Payer: Self-pay | Source: Home / Self Care | Attending: Internal Medicine

## 2024-05-17 DIAGNOSIS — I5033 Acute on chronic diastolic (congestive) heart failure: Secondary | ICD-10-CM | POA: Diagnosis not present

## 2024-05-17 DIAGNOSIS — R801 Persistent proteinuria, unspecified: Secondary | ICD-10-CM

## 2024-05-17 DIAGNOSIS — N1831 Chronic kidney disease, stage 3a: Secondary | ICD-10-CM | POA: Diagnosis not present

## 2024-05-17 DIAGNOSIS — I480 Paroxysmal atrial fibrillation: Secondary | ICD-10-CM | POA: Diagnosis not present

## 2024-05-17 DIAGNOSIS — I509 Heart failure, unspecified: Secondary | ICD-10-CM | POA: Diagnosis not present

## 2024-05-17 LAB — BASIC METABOLIC PANEL WITH GFR
Anion gap: 11 (ref 5–15)
BUN: 28 mg/dL — ABNORMAL HIGH (ref 8–23)
CO2: 30 mmol/L (ref 22–32)
Calcium: 8.7 mg/dL — ABNORMAL LOW (ref 8.9–10.3)
Chloride: 99 mmol/L (ref 98–111)
Creatinine, Ser: 1.2 mg/dL — ABNORMAL HIGH (ref 0.44–1.00)
GFR, Estimated: 44 mL/min — ABNORMAL LOW (ref >60)
Glucose, Bld: 95 mg/dL (ref 70–99)
Potassium: 3.6 mmol/L (ref 3.5–5.1)
Sodium: 140 mmol/L (ref 135–145)

## 2024-05-17 LAB — GLUCOSE, CAPILLARY
Glucose-Capillary: 95 mg/dL (ref 70–99)
Glucose-Capillary: 99 mg/dL (ref 70–99)
Glucose-Capillary: 151 mg/dL — ABNORMAL HIGH (ref 70–99)

## 2024-05-17 LAB — POCT I-STAT EG7
Acid-Base Excess: 7 mmol/L — ABNORMAL HIGH (ref 0.0–2.0)
Acid-Base Excess: 9 mmol/L — ABNORMAL HIGH (ref 0.0–2.0)
Bicarbonate: 32.6 mmol/L — ABNORMAL HIGH (ref 20.0–28.0)
Bicarbonate: 33.8 mmol/L — ABNORMAL HIGH (ref 20.0–28.0)
Calcium, Ion: 1.21 mmol/L (ref 1.15–1.40)
Calcium, Ion: 1.21 mmol/L (ref 1.15–1.40)
HCT: 30 % — ABNORMAL LOW (ref 36.0–46.0)
HCT: 31 % — ABNORMAL LOW (ref 36.0–46.0)
Hemoglobin: 10.2 g/dL — ABNORMAL LOW (ref 12.0–15.0)
Hemoglobin: 10.5 g/dL — ABNORMAL LOW (ref 12.0–15.0)
O2 Saturation: 58 %
O2 Saturation: 60 %
Potassium: 2.9 mmol/L — ABNORMAL LOW (ref 3.5–5.1)
Potassium: 2.9 mmol/L — ABNORMAL LOW (ref 3.5–5.1)
Sodium: 136 mmol/L (ref 135–145)
Sodium: 137 mmol/L (ref 135–145)
TCO2: 34 mmol/L — ABNORMAL HIGH (ref 22–32)
TCO2: 35 mmol/L — ABNORMAL HIGH (ref 22–32)
pCO2, Ven: 47.9 mmHg (ref 44–60)
pCO2, Ven: 48.2 mmHg (ref 44–60)
pH, Ven: 7.441 — ABNORMAL HIGH (ref 7.25–7.43)
pH, Ven: 7.454 — ABNORMAL HIGH (ref 7.25–7.43)
pO2, Ven: 30 mmHg — CL (ref 32–45)
pO2, Ven: 30 mmHg — CL (ref 32–45)

## 2024-05-17 SURGERY — RIGHT HEART CATH

## 2024-05-17 MED ORDER — HEPARIN (PORCINE) IN NACL 1000-0.9 UT/500ML-% IV SOLN
INTRAVENOUS | Status: DC | PRN
  Administered 2024-05-17: 500 mL

## 2024-05-17 MED ORDER — MIDAZOLAM HCL 2 MG/2ML IJ SOLN
INTRAMUSCULAR | Status: DC | PRN
  Administered 2024-05-17: .5 mg via INTRAVENOUS

## 2024-05-17 MED ORDER — LIDOCAINE HCL (PF) 1 % IJ SOLN
INTRAMUSCULAR | Status: DC | PRN
  Administered 2024-05-17: 2 mL

## 2024-05-17 MED ORDER — MIDAZOLAM HCL 2 MG/2ML IJ SOLN
INTRAMUSCULAR | Status: AC
  Filled 2024-05-17: qty 2

## 2024-05-17 MED ORDER — POTASSIUM CHLORIDE CRYS ER 10 MEQ PO TBCR
40.0000 meq | EXTENDED_RELEASE_TABLET | Freq: Every day | ORAL | Status: DC
  Administered 2024-05-18 – 2024-05-24 (×7): 40 meq via ORAL
  Filled 2024-05-17 (×7): qty 4

## 2024-05-17 MED ORDER — LIDOCAINE HCL (PF) 1 % IJ SOLN
INTRAMUSCULAR | Status: AC
Start: 2024-05-17 — End: 2024-05-17
  Filled 2024-05-17: qty 30

## 2024-05-17 SURGICAL SUPPLY — 7 items
CATH BALLN WEDGE 5F 110CM (CATHETERS) IMPLANT
KIT MICROPUNCTURE NIT STIFF (SHEATH) IMPLANT
PACK CARDIAC CATHETERIZATION (CUSTOM PROCEDURE TRAY) IMPLANT
SHEATH GLIDE SLENDER 4/5FR (SHEATH) IMPLANT
SHEATH PROBE COVER 6X72 (BAG) IMPLANT
TRANSDUCER W/STOPCOCK (MISCELLANEOUS) IMPLANT
TUBING ART PRESS 72 MALE/FEM (TUBING) IMPLANT

## 2024-05-17 NOTE — Progress Notes (Signed)
 Progress Note  Patient Name: Catherine [name] Date of Encounter: 05/17/2024 [facility] Cardiologist: Annabella Scarce, MD   Interval Summary   Reports she feels about the same Shortness of breath may be worse today No significant increase in diuresis with metolazone  yesterday Has not been eating this AM in preparation for possible RHC  Vital Signs Vitals:   05/16/24 2046 05/16/24 2349 05/17/24 0443 05/17/24 0808  BP: 126/63 (!) 104/50 (!) 125/58 (!) 103/54  Pulse: 81 (!) 59 62 (!) 59  Resp: 18 18 16    Temp: 98.2 F (36.8 C) (!) 97.4 F (36.3 C) 98.3 F (36.8 C) 97.8 F (36.6 C)  TempSrc: Oral Oral Oral Oral  SpO2: 96% 96% 92% 93%  Weight:   56.9 kg   Height:        Intake/Output Summary (Last 24 hours) at 05/17/2024 0832 Last data filed at 05/17/2024 9191 Gross per 24 hour  Intake --  Output 1400 ml  Net -1400 ml      05/17/2024    4:43 AM 05/16/2024    5:06 AM 05/15/2024    5:00 AM  Last 3 Weights  Weight (lbs) 125 lb 6.4 oz 124 lb 9.6 oz 126 lb 4.8 oz  Weight (kg) 56.881 kg 56.518 kg 57.289 kg     Telemetry/ECG  AV paced, HR 60s - Personally Reviewed  Physical Exam  GEN: No acute distress.   Neck: No JVD Cardiac: RRR, no murmurs, rubs, or gallops.  Respiratory: Clear to auscultation bilaterally. GI: Soft, nontender, non-distended  MS: 2+ LE edema  Assessment & Plan  Acute on chronic HFpEF, NYHA IV  Hypertension  Presented with worsening LE edema, shortness of breath, cough x 1 week  CXR showed bilateral pleural effusion, pulmonary vascular congestion  Echo 03/2024 showed EF 70-75% BNP 15,910 Net - 2.8 L this admission Creatinine trending down this AM, 1.26 ? 1.20 Most recent BP 103/54, most BP low-normal Weight 125 lb, down from 127 lb (120 lb when recently discharged)  She reports no significant increase in urine output and says she feels her shortness of breath is the same/possibly a bit worse today  Currently on IV Lasix  80 mg BID, added  dose of metolazone  yesterday with PM Lasix  dose, had 1.3 L urine output  She has not eaten anything yet today, in preparation for possible RHC  Continue Jardiance  10 mg  Renal function limited GDMT, intolerant to beta-blockers  Currently requiring midodrine  5 mg BID  Continue strict I&O's, daily weights, daily BMPs  Informed Consent   Shared Decision Making/Informed Consent The risks, including but not limited to, [bleeding or vascular complications (1 in 500), pneumothorax (1 in 1600), arrhythmia (1 in 1000) and death (1 in 5000)], benefits (diagnostic support and/or management of heart failure, pulmonary hypertension) and alternatives of a right heart catheterization were discussed in detail with Catherine [name] and she is willing to proceed.      CAD s/p CABG x 1, 2018 Troponin level 159 ? 158 ? 181 ? 187   No active chest pain  Suspect secondary to demand ischemia  Not on ASA due to Eliquis  use  Intolerant to beta-blockers, statins  No plans for ischemic evaluation at this time    Sick sinus syndrome  Junctional tachycardia  S/p PPM Paroxysmal atrial fibrillation SVT Remains rate controlled  Planned SVT ablation in September  Pacemaker functioning appropriately  No current chest pain  Intolerant to beta-blockers  Plan to proceed with SVT ablation in September  Continue Eliquis  2.5 mg BID    Hyperlipidemia  04/09/2024: HDL 42; LDL Chol Calc (NIH) 127 05/15/2024: ALT 14  Intolerant to statins  LDL uncontrolled on Leqvio    S/p bioprostehtic MVR, 2018 Stable per echo 03/2024     Per primary  Nephrotic syndrome  Hypoalbuminemia Recent weight loss, decreased appetite  CKD 3a COPD Pleural effusions Electrolyte disturbances   For questions or updates, please contact [facility] Please consult [email or web address] for contact info under       Signed, Waddell DELENA Donath, PA-C

## 2024-05-17 NOTE — Progress Notes (Addendum)
 PROGRESS NOTE    Catherine [name]  FMW:[id number] DOB: Nov 10, 1934 DOA: 05/14/2024 PCP: Micheal Wolm ORN, MD   Brief Narrative:  Catherine [name] is a 89 y.o. 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  Patient presented secondary to leg swelling and dyspnea, found to have evidence of acute heart failure/volume overload. IV diuresis started hospitalist called for admission and cardiology called in consult.  Assessment & Plan:   Principal Problem:   Pleural effusion due to CHF (congestive heart failure) (HCC) Active Problems:   Atrial fibrillation (HCC)   S/P mitral valve replacement with bioprosthetic valve - Oct 14, 2016   Acute on chronic diastolic CHF (congestive heart failure) (HCC)   Essential hypertension   CKD stage 3a, GFR 45-59 ml/min (HCC)   Coronary artery disease   COPD  GOLD 2   Protein-calorie malnutrition, severe   Rheumatic heart disease   Sick sinus syndrome (HCC)  Acute on chronic diastolic heart failure - Poor urine output despite increasing diuretics, appreciate cardiology recommendations, metolazone  initiated - Urine output continues to be less than expected given diuretics - Lower extremity edema ongoing, continue compression wrap and stockings in the next 48 hours as tolerated -Ongoing shortness of breath/dyspnea with exertion and orthopnea -Prior echo July 2025 with 70 to 75% EF with indeterminate diastolic parameters -Right heart cath planned later today with cardiology -Strict in/out -Daily weights   Atrial fibrillation, rate controlled -Continue low-dose Eliquis  secondary to age and weight   Pleural effusion In the setting of heart failure, continue diuretics as above   History of severe mitral valve stenosis s/p mitral valve replacement with  bioprosthetic valve Noted.  Cardiology following.   Chronic hypotension Continue home midodrine , titrate as appropriate   CKD stage IIIa, at baseline Proteinuria - Baseline creatinine is around 1.2-1.4. Stable. - Creatinine currently at baseline, will discuss case with nephrology given proteinuria, not unexpected given patient's advanced CKD but may benefit from further evaluation or imaging/biopsy while inpatient - Discussed with nephrology -recommending increased diuretics over the next 24 hours with furosemide  to 80 mg three times a day and repeating two 5mg  doses of metolazone  today - if no improvement could consider lasix  gtt. No indication for consult at this time however if her UOP does not respond or worsens we would likely formally consult them to help guide treatment. Would imagine she is not a candidate for HD/PD given age/comorbidities.   COPD -Continue Duoneb/albuterol  CAD No chest pain. Not on aspirin , statin, or beta blocker. Severe malnutrition Noted. Rheumatic heart disease Noted. Stable. Sick sinus syndrome S/p pacemaker  DVT prophylaxis: apixaban  (ELIQUIS ) tablet 2.5 mg Start: 05/15/24 2200 Place TED hose Start: 05/14/24 2205 SCDs Start: 05/14/24 2012 apixaban  (ELIQUIS ) tablet 2.5 mg   Code Status:   Code Status: Limited: Do not attempt resuscitation (DNR) -DNR-LIMITED -Do Not Intubate/DNI   Family Communication: None present  Status is: Inpatient  Dispo: The patient is from: Home              Anticipated d/c is to: Pending clinical course              Anticipated d/c date is: To be determined likely 48 to 72 hours              Patient currently not medically stable for discharge given ongoing need for IV diuretics close  monitoring in the setting of heart failure exacerbation  Consultants:  Cardiology  Procedures:  RHC 8/21  Antimicrobials:  None indicated  Subjective: No acute issues or events overnight, indicates lower extremity edema is improved with  stocking/compression but otherwise feels unchanged from prior, still dyspneic with short exertion and orthopneic.  Objective: Vitals:   05/16/24 2046 05/16/24 2349 05/17/24 0443 05/17/24 0808  BP: 126/63 (!) 104/50 (!) 125/58 (!) 103/54  Pulse: 81 (!) 59 62 (!) 59  Resp: 18 18 16    Temp: 98.2 F (36.8 C) (!) 97.4 F (36.3 C) 98.3 F (36.8 C) 97.8 F (36.6 C)  TempSrc: Oral Oral Oral Oral  SpO2: 96% 96% 92% 93%  Weight:   56.9 kg   Height:        Intake/Output Summary (Last 24 hours) at 05/17/2024 0812 Last data filed at 05/17/2024 9191 Gross per 24 hour  Intake --  Output 1400 ml  Net -1400 ml   Filed Weights   05/15/24 0500 05/16/24 0506 05/17/24 0443  Weight: 57.3 kg 56.5 kg 56.9 kg    Examination:  General:  Pleasantly resting in bed, No acute distress. HEENT:  Normocephalic atraumatic.  Sclerae nonicteric, noninjected.  Extraocular movements intact bilaterally. Neck:  Without mass or deformity.  Trachea is midline. Lungs:  Clear to auscultate bilaterally without rhonchi, wheeze, or rales. Heart:  Regular rate and rhythm.  Without murmurs, rubs, or gallops. Abdomen:  Soft, nontender, nondistended.  Without guarding or rebound. Extremities: 1+ pitting edema extending above the knee  Data Reviewed: I have personally reviewed following labs and imaging studies  CBC: Recent Labs  Lab 05/14/24 0731 05/15/24 0449 05/16/24 0426  WBC 9.1 6.6 6.1  NEUTROABS 7.1  --   --   HGB 11.6* 11.5* 10.8*  HCT 35.0* 34.6* 32.2*  MCV 92.1 91.8 90.7  PLT 153 141* 138*   Basic Metabolic Panel: Recent Labs  Lab 05/14/24 0731 05/15/24 0449 05/16/24 0426 05/17/24 0429  NA 135 139 137 140  K 4.3 3.7 2.7* 3.6  CL 95* 98 97* 99  CO2 30 31 31 30   GLUCOSE 118* 102* 100* 95  BUN 33* 29* 29* 28*  CREATININE 1.34* 1.16* 1.26* 1.20*  CALCIUM  10.0 9.4 8.7* 8.7*  MG  --  2.3  --   --   PHOS  --  4.3  --   --    GFR: Estimated Creatinine Clearance: 27.8 mL/min (A) (by C-G formula  based on SCr of 1.2 mg/dL (H)). Liver Function Tests: Recent Labs  Lab 05/14/24 0731 05/15/24 0449  AST 33 28  ALT 13 14  ALKPHOS 84 60  BILITOT 0.3 0.6  PROT 5.4* 4.9*  ALBUMIN  2.8* 2.0*   BNP (last 3 results) Recent Labs    03/24/24 2240 04/15/24 1604 05/14/24 0731  PROBNP 5,712.0* 14,671.0* 15,910.0*   Thyroid  Function Tests: Recent Labs    05/14/24 2055 05/15/24 0006  TSH 5.039*  --   FREET4  --  0.83   Anemia Panel: Recent Labs    05/15/24 0449  VITAMINB12 496  FOLATE >40.0  FERRITIN 99  TIBC 279  IRON  42  RETICCTPCT 1.5   Sepsis Labs: Recent Labs  Lab 05/14/24 0727 05/14/24 0943  LATICACIDVEN 1.7 1.0    Recent Results (from the past 240 hours)  Resp panel by RT-PCR (RSV, Flu A&B, Covid) Anterior Nasal Swab     Status: None   Collection Time: 05/14/24  7:31 AM   Specimen: Anterior Nasal Swab  Result Value Ref Range Status   SARS Coronavirus 2 by RT PCR NEGATIVE NEGATIVE Final    Comment: (NOTE) SARS-CoV-2 target nucleic acids are NOT DETECTED.  The SARS-CoV-2 RNA is generally detectable in upper respiratory specimens during the acute phase of infection. The lowest concentration of SARS-CoV-2 viral copies this assay can detect is 138 copies/mL. A negative result does not preclude SARS-Cov-2 infection and should not be used as the sole basis for treatment or other patient management decisions. A negative result may occur with  improper specimen collection/handling, submission of specimen other than nasopharyngeal swab, presence of viral mutation(s) within the areas targeted by this assay, and inadequate number of viral copies(<138 copies/mL). A negative result must be combined with clinical observations, patient history, and epidemiological information. The expected result is Negative.  Fact Sheet for Patients:  BloggerCourse.com  Fact Sheet for Healthcare Providers:  SeriousBroker.it  This  test is no t yet approved or cleared by the [place]  FDA and  has been authorized for detection and/or diagnosis of SARS-CoV-2 by FDA under an Emergency Use Authorization (EUA). This EUA will remain  in effect (meaning this test can be used) for the duration of the COVID-19 declaration under Section 564(b)(1) of the Act, 21 U.S.C.section 360bbb-3(b)(1), unless the authorization is terminated  or revoked sooner.       Influenza A by PCR NEGATIVE NEGATIVE Final   Influenza B by PCR NEGATIVE NEGATIVE Final    Comment: (NOTE) The Xpert Xpress SARS-CoV-2/FLU/RSV plus assay is intended as an aid in the diagnosis of influenza from Nasopharyngeal swab specimens and should not be used as a sole basis for treatment. Nasal washings and aspirates are unacceptable for Xpert Xpress SARS-CoV-2/FLU/RSV testing.  Fact Sheet for Patients: BloggerCourse.com  Fact Sheet for Healthcare Providers: SeriousBroker.it  This test is not yet approved or cleared by the [place]  FDA and has been authorized for detection and/or diagnosis of SARS-CoV-2 by FDA under an Emergency Use Authorization (EUA). This EUA will remain in effect (meaning this test can be used) for the duration of the COVID-19 declaration under Section 564(b)(1) of the Act, 21 U.S.C. section 360bbb-3(b)(1), unless the authorization is terminated or revoked.     Resp Syncytial Virus by PCR NEGATIVE NEGATIVE Final    Comment: (NOTE) Fact Sheet for Patients: BloggerCourse.com  Fact Sheet for Healthcare Providers: SeriousBroker.it  This test is not yet approved or cleared by the [place]  FDA and has been authorized for detection and/or diagnosis of SARS-CoV-2 by FDA under an Emergency Use Authorization (EUA). This EUA will remain in effect (meaning this test can be used) for the duration of the COVID-19 declaration under  Section 564(b)(1) of the Act, 21 U.S.C. section 360bbb-3(b)(1), unless the authorization is terminated or revoked.  Performed at [facility], [address] [place], [place] [address]          Radiology Studies: No results found.      Scheduled Meds:  apixaban   2.5 mg Oral BID   diazepam   5 mg Oral QHS   docusate sodium   100 mg Oral BID   empagliflozin   10 mg Oral q AM   feeding supplement  1 Container Oral BID BM   feeding supplement  237 mL Oral BID BM   furosemide   80 mg Intravenous BID   guaiFENesin   600 mg Oral BID   midodrine   5 mg Oral BID WC   multivitamin with minerals  1 tablet Oral Daily   neomycin -bacitracin -polymyxin  Topical BID   senna  1 tablet Oral BID   sodium chloride  flush  3 mL Intravenous Q12H   zinc  sulfate (50mg  elemental zinc )  220 mg Oral Daily   Continuous Infusions:     LOS: 3 days   Time spent:  Elsie JAYSON Montclair, DO [facility]  If 7PM-7AM, please contact night-coverage [email or web address]  05/17/2024, 8:12 AM

## 2024-05-17 NOTE — Plan of Care (Signed)
  Problem: Health Behavior/Discharge Planning: Goal: Ability to manage health-related needs will improve Outcome: Progressing   Problem: Clinical Measurements: Goal: Respiratory complications will improve Outcome: Progressing Goal: Cardiovascular complication will be avoided Outcome: Progressing   Problem: Activity: Goal: Risk for activity intolerance will decrease Outcome: Progressing   Problem: Nutrition: Goal: Adequate nutrition will be maintained Outcome: Progressing   Problem: Pain Managment: Goal: General experience of comfort will improve and/or be controlled Outcome: Progressing   Problem: Safety: Goal: Ability to remain free from injury will improve Outcome: Progressing

## 2024-05-17 NOTE — Plan of Care (Signed)
   Problem: Education: Goal: Knowledge of General Education information will improve Description Including pain rating scale, medication(s)/side effects and non-pharmacologic comfort measures Outcome: Progressing   Problem: Health Behavior/Discharge Planning: Goal: Ability to manage health-related needs will improve Outcome: Progressing

## 2024-05-17 NOTE — Interval H&P Note (Signed)
 History and Physical Interval Note:  05/17/2024 1:45 PM  Catherine [name]  has presented today for surgery, with the diagnosis of heart failure.  The various methods of treatment have been discussed with the patient and family. After consideration of risks, benefits and other options for treatment, the patient has consented to  Procedure(s): RIGHT HEART CATH (N/A) as a surgical intervention.  The patient's history has been reviewed, patient examined, no change in status, stable for surgery.  I have reviewed the patient's chart and labs.  Questions were answered to the patient's satisfaction.     Flo Berroa

## 2024-05-17 NOTE — H&P (View-Only) (Signed)
 Progress Note  Patient Name: Catherine [name] Date of Encounter: 05/17/2024 [facility] Cardiologist: Annabella Scarce, MD   Interval Summary   Reports she feels about the same Shortness of breath may be worse today No significant increase in diuresis with metolazone  yesterday Has not been eating this AM in preparation for possible RHC  Vital Signs Vitals:   05/16/24 2046 05/16/24 2349 05/17/24 0443 05/17/24 0808  BP: 126/63 (!) 104/50 (!) 125/58 (!) 103/54  Pulse: 81 (!) 59 62 (!) 59  Resp: 18 18 16    Temp: 98.2 F (36.8 C) (!) 97.4 F (36.3 C) 98.3 F (36.8 C) 97.8 F (36.6 C)  TempSrc: Oral Oral Oral Oral  SpO2: 96% 96% 92% 93%  Weight:   56.9 kg   Height:        Intake/Output Summary (Last 24 hours) at 05/17/2024 0832 Last data filed at 05/17/2024 9191 Gross per 24 hour  Intake --  Output 1400 ml  Net -1400 ml      05/17/2024    4:43 AM 05/16/2024    5:06 AM 05/15/2024    5:00 AM  Last 3 Weights  Weight (lbs) 125 lb 6.4 oz 124 lb 9.6 oz 126 lb 4.8 oz  Weight (kg) 56.881 kg 56.518 kg 57.289 kg     Telemetry/ECG  AV paced, HR 60s - Personally Reviewed  Physical Exam  GEN: No acute distress.   Neck: No JVD Cardiac: RRR, no murmurs, rubs, or gallops.  Respiratory: Clear to auscultation bilaterally. GI: Soft, nontender, non-distended  MS: 2+ LE edema  Assessment & Plan  Acute on chronic HFpEF, NYHA IV  Hypertension  Presented with worsening LE edema, shortness of breath, cough x 1 week  CXR showed bilateral pleural effusion, pulmonary vascular congestion  Echo 03/2024 showed EF 70-75% BNP 15,910 Net - 2.8 L this admission Creatinine trending down this AM, 1.26 ? 1.20 Most recent BP 103/54, most BP low-normal Weight 125 lb, down from 127 lb (120 lb when recently discharged)  She reports no significant increase in urine output and says she feels her shortness of breath is the same/possibly a bit worse today  Currently on IV Lasix  80 mg BID, added  dose of metolazone  yesterday with PM Lasix  dose, had 1.3 L urine output  She has not eaten anything yet today, in preparation for possible RHC  Continue Jardiance  10 mg  Renal function limited GDMT, intolerant to beta-blockers  Currently requiring midodrine  5 mg BID  Continue strict I&O's, daily weights, daily BMPs  Informed Consent   Shared Decision Making/Informed Consent The risks, including but not limited to, [bleeding or vascular complications (1 in 500), pneumothorax (1 in 1600), arrhythmia (1 in 1000) and death (1 in 5000)], benefits (diagnostic support and/or management of heart failure, pulmonary hypertension) and alternatives of a right heart catheterization were discussed in detail with Ms. Whittier and she is willing to proceed.      CAD s/p CABG x 1, 2018 Troponin level 159 ? 158 ? 181 ? 187   No active chest pain  Suspect secondary to demand ischemia  Not on ASA due to Eliquis  use  Intolerant to beta-blockers, statins  No plans for ischemic evaluation at this time    Sick sinus syndrome  Junctional tachycardia  S/p PPM Paroxysmal atrial fibrillation SVT Remains rate controlled  Planned SVT ablation in September  Pacemaker functioning appropriately  No current chest pain  Intolerant to beta-blockers  Plan to proceed with SVT ablation in September  Continue Eliquis  2.5 mg BID    Hyperlipidemia  04/09/2024: HDL 42; LDL Chol Calc (NIH) 127 05/15/2024: ALT 14  Intolerant to statins  LDL uncontrolled on Leqvio    S/p bioprostehtic MVR, 2018 Stable per echo 03/2024     Per primary  Nephrotic syndrome  Hypoalbuminemia Recent weight loss, decreased appetite  CKD 3a COPD Pleural effusions Electrolyte disturbances   For questions or updates, please contact [facility] Please consult [email or web address] for contact info under       Signed, Waddell DELENA Donath, PA-C

## 2024-05-18 ENCOUNTER — Inpatient Hospital Stay (HOSPITAL_COMMUNITY)

## 2024-05-18 ENCOUNTER — Encounter (HOSPITAL_COMMUNITY): Payer: Self-pay | Admitting: Cardiovascular Disease

## 2024-05-18 DIAGNOSIS — N1831 Chronic kidney disease, stage 3a: Secondary | ICD-10-CM | POA: Diagnosis not present

## 2024-05-18 DIAGNOSIS — R768 Other specified abnormal immunological findings in serum: Secondary | ICD-10-CM

## 2024-05-18 DIAGNOSIS — C9000 Multiple myeloma not having achieved remission: Secondary | ICD-10-CM

## 2024-05-18 DIAGNOSIS — I509 Heart failure, unspecified: Secondary | ICD-10-CM | POA: Diagnosis not present

## 2024-05-18 DIAGNOSIS — I5033 Acute on chronic diastolic (congestive) heart failure: Secondary | ICD-10-CM | POA: Diagnosis not present

## 2024-05-18 LAB — BASIC METABOLIC PANEL WITH GFR
Anion gap: 8 (ref 5–15)
Anion gap: 11 (ref 5–15)
BUN: 28 mg/dL — ABNORMAL HIGH (ref 8–23)
BUN: 27 mg/dL — ABNORMAL HIGH (ref 8–23)
CO2: 32 mmol/L (ref 22–32)
CO2: 34 mmol/L — ABNORMAL HIGH (ref 22–32)
Calcium: 9 mg/dL (ref 8.9–10.3)
Calcium: 9.5 mg/dL (ref 8.9–10.3)
Chloride: 98 mmol/L (ref 98–111)
Chloride: 101 mmol/L (ref 98–111)
Creatinine, Ser: 1.27 mg/dL — ABNORMAL HIGH (ref 0.44–1.00)
Creatinine, Ser: 1.2 mg/dL — ABNORMAL HIGH (ref 0.44–1.00)
GFR, Estimated: 41 mL/min — ABNORMAL LOW (ref >60)
GFR, Estimated: 44 mL/min — ABNORMAL LOW (ref >60)
Glucose, Bld: 100 mg/dL — ABNORMAL HIGH (ref 70–99)
Glucose, Bld: 123 mg/dL — ABNORMAL HIGH (ref 70–99)
Potassium: 2.8 mmol/L — ABNORMAL LOW (ref 3.5–5.1)
Potassium: 3.5 mmol/L (ref 3.5–5.1)
Sodium: 138 mmol/L (ref 135–145)
Sodium: 146 mmol/L — ABNORMAL HIGH (ref 135–145)

## 2024-05-18 MED ORDER — SODIUM CHLORIDE 0.9 % IV SOLN: 250.0000 mL | INTRAVENOUS | Status: AC | PRN

## 2024-05-18 MED ORDER — SODIUM CHLORIDE 0.9% FLUSH: 3.0000 mL | INTRAVENOUS | Status: DC | PRN

## 2024-05-18 MED ORDER — MUPIROCIN 2 % EX OINT: TOPICAL_OINTMENT | Freq: Two times a day (BID) | CUTANEOUS | Status: DC

## 2024-05-18 MED ORDER — PREGABALIN 75 MG PO CAPS
75.0000 mg | ORAL_CAPSULE | Freq: Two times a day (BID) | ORAL | Status: DC
  Administered 2024-05-18 (×2): 75 mg via ORAL
  Filled 2024-05-18 (×3): qty 1

## 2024-05-18 MED ORDER — POTASSIUM CHLORIDE CRYS ER 10 MEQ PO TBCR
40.0000 meq | EXTENDED_RELEASE_TABLET | ORAL | Status: AC
  Administered 2024-05-18: 40 meq via ORAL
  Filled 2024-05-18: qty 4

## 2024-05-18 MED ORDER — MIDODRINE HCL 5 MG PO TABS
10.0000 mg | ORAL_TABLET | Freq: Three times a day (TID) | ORAL | Status: DC
  Administered 2024-05-18 – 2024-05-24 (×18): 10 mg via ORAL
  Filled 2024-05-18 (×18): qty 2

## 2024-05-18 MED ORDER — POTASSIUM CHLORIDE CRYS ER 20 MEQ PO TBCR: 40.0000 meq | EXTENDED_RELEASE_TABLET | ORAL | Status: DC

## 2024-05-18 MED ORDER — POTASSIUM CHLORIDE 20 MEQ PO PACK
60.0000 meq | PACK | Freq: Once | ORAL | Status: AC
  Administered 2024-05-18: 60 meq via ORAL
  Filled 2024-05-18: qty 3

## 2024-05-18 MED ORDER — SODIUM CHLORIDE 0.9% FLUSH
3.0000 mL | Freq: Two times a day (BID) | INTRAVENOUS | Status: DC
  Administered 2024-05-18 – 2024-05-22 (×9): 3 mL via INTRAVENOUS

## 2024-05-18 NOTE — Consult Note (Addendum)
 WOC Nurse Consult Note: Reason for Consult:blister under left breast  Wound type: full thickness unknown etiology  Pressure Injury POA: NA  Measurement: see nursing flowsheet  Wound bed:4 separate areas underneath L breast largely pink  Drainage (amount, consistency, odor) see nursing flowsheet  Periwound: intact  Dressing procedure/placement/frequency: Cleanse wounds under L breast/upper abdomen with NS, apply neosporin ointment to wound beds 2 times daily, cover with Telfa nonstick dressing and tape or silicone foam.    POC discussed with bedside nurse. WOC team will not follow. Re-consult if further needs arise.   Thank you,    Powell Bar MSN, RN-BC, [employer]

## 2024-05-18 NOTE — Progress Notes (Signed)
 Mobility Specialist: Progress Note   05/18/24 1600  Mobility  Activity Ambulated independently  Level of Assistance Independent  Assistive Device None  Distance Ambulated (ft) 500 ft  Activity Response Tolerated well  Mobility Referral Yes  Mobility visit 1 Mobility  Mobility Specialist Start Time (ACUTE ONLY) 1239  Mobility Specialist Stop Time (ACUTE ONLY) 1255  Mobility Specialist Time Calculation (min) (ACUTE ONLY) 16 min    Pt received in chair, agreeable to mobility session. Ind throughout. Took 2x standing breaks d/t fatigue. SpO2 95% on RA. Returned to room without fault. Left in chair with all needs met, call bell in reach.   Ileana Lute Mobility Specialist Please contact via SecureChat or Rehab office at [phone]

## 2024-05-18 NOTE — Progress Notes (Signed)
 PROGRESS NOTE    Catherine [name]  FMW:[id number] DOB: Sep 27, 1935 DOA: 05/14/2024 PCP: Micheal Wolm ORN, MD   Brief Narrative:  Catherine [name] is a 89 y.o. 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  Patient presented secondary to leg swelling and dyspnea, found to have evidence of acute heart failure/volume overload. IV diuresis started hospitalist called for admission and cardiology called in consult.  Assessment & Plan:   Principal Problem:   Pleural effusion due to CHF (congestive heart failure) (HCC) Active Problems:   Atrial fibrillation (HCC)   S/P mitral valve replacement with bioprosthetic valve - Oct 14, 2016   Acute on chronic diastolic CHF (congestive heart failure) (HCC)   Essential hypertension   CKD stage 3a, GFR 45-59 ml/min (HCC)   Coronary artery disease   COPD  GOLD 2   Protein-calorie malnutrition, severe   Rheumatic heart disease   Sick sinus syndrome (HCC)   Persistent proteinuria  Acute on chronic diastolic heart failure -Ongoing shortness of breath/dyspnea with exertion and orthopnea -Prior echo July 2025 with 70 to 75% EF with indeterminate diastolic parameters -Right heart cath without elevated right-sided pressures consistent with euvolemia -Furosemide  on hold, transition to Bumex  in the next 24 to 48 hours   CKD stage IIIa, at baseline - Baseline creatinine 1.2-1.4, remained stable despite increasing diuretics, now on hold as above - Nephrology to assist with further workup given proteinuria, see below  Hypokalemia -Secondary to aggressive diuresis, replete, follow repeat labs  Proteinuria - Unclear etiology - Questionably secondary to advancing CKD however cannot rule out secondary causes, elevated M spike noted -oncology consulted for further  recommendations. - Currently being evaluated for amyloidosis versus monoclonal gammopathy - Increasing midodrine  as below - avoid nephrotoxins  Diffuse edema  Pleural effusion Initially presumed to be secondary to heart failure, given right heart cath concern for possible secondary causes and proteinuria as above   History of severe mitral valve stenosis s/p mitral valve replacement with bioprosthetic valve Noted.  Cardiology following.   Atrial fibrillation, rate controlled -Continue low-dose Eliquis  secondary to age and weight  Chronic hypotension Increase midodrine  10 mg 3 times daily given moderately depressed blood pressures overnight and this morning   Nerve pain/neuropathy - Appears to be chronic given patient reports prior sciatica pain - Overnight bilateral lower extremity paresthesias and pain - Will  restart Lyrica , patient reports previous allergy to Lyrica  and gabapentin in the form of lower extremity swelling, this is likely secondary to above and not an actual reaction/allergy.  Follow clinically if any signs or symptoms of intolerance we will discontinue  COPD -Continue Duoneb/albuterol  CAD No chest pain. Not on aspirin , statin, or beta blocker. Severe malnutrition Noted. Rheumatic heart disease Noted. Stable. Sick sinus syndrome S/p pacemaker  DVT prophylaxis: apixaban  (ELIQUIS ) tablet 2.5 mg Start: 05/15/24 2200 Place TED hose Start: 05/14/24 2205 SCDs Start: 05/14/24 2012 apixaban  (ELIQUIS ) tablet 2.5 mg   Code Status:   Code Status: Limited: Do not attempt resuscitation (DNR) -DNR-LIMITED -Do Not Intubate/DNI   Family Communication: None present  Status is: Inpatient  Dispo: The patient is from: Home              Anticipated d/c is to: Pending clinical course              Anticipated d/c date is: To be determined likely  48 to 72 hours              Patient currently not medically stable for discharge given ongoing need for IV diuretics close monitoring  in the setting of heart failure exacerbation  Consultants:  Cardiology  Procedures:  RHC 8/21  Antimicrobials:  None indicated  Subjective: No acute issues or events overnight, indicates lower extremity edema is improved with stocking/compression but otherwise feels unchanged from prior, still dyspneic with short exertion and orthopneic.  Objective: Vitals:   05/17/24 2326 05/18/24 0544 05/18/24 0546 05/18/24 0627  BP: (!) 107/52 120/68    Pulse: 85 (!) 102    Resp: 15 16    Temp: 98.2 F (36.8 C) 98 F (36.7 C)    TempSrc: Oral Oral    SpO2: 98% 93% 93%   Weight:    54.2 kg  Height:        Intake/Output Summary (Last 24 hours) at 05/18/2024 0745 Last data filed at 05/18/2024 0654 Gross per 24 hour  Intake 120 ml  Output 1550 ml  Net -1430 ml   Filed Weights   05/16/24 0506 05/17/24 0443 05/18/24 0627  Weight: 56.5 kg 56.9 kg 54.2 kg    Examination:  General:  Pleasantly resting in bed, No acute distress. HEENT:  Normocephalic atraumatic.  Sclerae nonicteric, noninjected.  Extraocular movements intact bilaterally. Neck:  Without mass or deformity.  Trachea is midline. Lungs:  Clear to auscultate bilaterally without rhonchi, wheeze, or rales. Heart:  Regular rate and rhythm.  Without murmurs, rubs, or gallops. Abdomen:  Soft, nontender, nondistended.  Without guarding or rebound. Extremities: 1+ pitting edema extending above the knee  Data Reviewed: I have personally reviewed following labs and imaging studies  CBC: Recent Labs  Lab 05/14/24 0731 05/15/24 0449 05/16/24 0426 05/17/24 1408  WBC 9.1 6.6 6.1  --   NEUTROABS 7.1  --   --   --   HGB 11.6* 11.5* 10.8* 10.5*  10.2*  HCT 35.0* 34.6* 32.2* 31.0*  30.0*  MCV 92.1 91.8 90.7  --   PLT 153 141* 138*  --    Basic Metabolic Panel: Recent Labs  Lab 05/14/24 0731 05/15/24 0449 05/16/24 0426 05/17/24 0429 05/17/24 1408 05/18/24 0537  NA 135 139 137 140 136  137 138  K 4.3 3.7 2.7* 3.6 2.9*   2.9* 2.8*  CL 95* 98 97* 99  --  98  CO2 30 31 31 30   --  32  GLUCOSE 118* 102* 100* 95  --  100*  BUN 33* 29* 29* 28*  --  28*  CREATININE 1.34* 1.16* 1.26* 1.20*  --  1.27*  CALCIUM  10.0 9.4 8.7* 8.7*  --  9.0  MG  --  2.3  --   --   --   --   PHOS  --  4.3  --   --   --   --    GFR: Estimated Creatinine Clearance: 26.3 mL/min (A) (by C-G formula based on SCr of 1.27 mg/dL (H)). Liver Function Tests: Recent Labs  Lab 05/14/24 0731 05/15/24 0449  AST 33 28  ALT 13 14  ALKPHOS 84 60  BILITOT 0.3 0.6  PROT 5.4* 4.9*  ALBUMIN  2.8* 2.0*   BNP (last 3 results) Recent Labs    03/24/24 2240 04/15/24 1604 05/14/24 0731  PROBNP 5,712.0* 14,671.0* 15,910.0*   Thyroid  Function Tests: No results for input(s): TSH, T4TOTAL, FREET4, T3FREE, THYROIDAB in the last 72 hours.  Anemia Panel: No results  for input(s): VITAMINB12, FOLATE, FERRITIN, TIBC, IRON , RETICCTPCT in the last 72 hours.  Sepsis Labs: Recent Labs  Lab 05/14/24 0727 05/14/24 0943  LATICACIDVEN 1.7 1.0    Recent Results (from the past 240 hours)  Resp panel by RT-PCR (RSV, Flu A&B, Covid) Anterior Nasal Swab     Status: None   Collection Time: 05/14/24  7:31 AM   Specimen: Anterior Nasal Swab  Result Value Ref Range Status   SARS Coronavirus 2 by RT PCR NEGATIVE NEGATIVE Final    Comment: (NOTE) SARS-CoV-2 target nucleic acids are NOT DETECTED.  The SARS-CoV-2 RNA is generally detectable in upper respiratory specimens during the acute phase of infection. The lowest concentration of SARS-CoV-2 viral copies this assay can detect is 138 copies/mL. A negative result does not preclude SARS-Cov-2 infection and should not be used as the sole basis for treatment or other patient management decisions. A negative result may occur with  improper specimen collection/handling, submission of specimen other than nasopharyngeal swab, presence of viral mutation(s) within the areas targeted by this  assay, and inadequate number of viral copies(<138 copies/mL). A negative result must be combined with clinical observations, patient history, and epidemiological information. The expected result is Negative.  Fact Sheet for Patients:  BloggerCourse.com  Fact Sheet for Healthcare Providers:  SeriousBroker.it  This test is no t yet approved or cleared by the [place]  FDA and  has been authorized for detection and/or diagnosis of SARS-CoV-2 by FDA under an Emergency Use Authorization (EUA). This EUA will remain  in effect (meaning this test can be used) for the duration of the COVID-19 declaration under Section 564(b)(1) of the Act, 21 U.S.C.section 360bbb-3(b)(1), unless the authorization is terminated  or revoked sooner.       Influenza A by PCR NEGATIVE NEGATIVE Final   Influenza B by PCR NEGATIVE NEGATIVE Final    Comment: (NOTE) The Xpert Xpress SARS-CoV-2/FLU/RSV plus assay is intended as an aid in the diagnosis of influenza from Nasopharyngeal swab specimens and should not be used as a sole basis for treatment. Nasal washings and aspirates are unacceptable for Xpert Xpress SARS-CoV-2/FLU/RSV testing.  Fact Sheet for Patients: BloggerCourse.com  Fact Sheet for Healthcare Providers: SeriousBroker.it  This test is not yet approved or cleared by the [place]  FDA and has been authorized for detection and/or diagnosis of SARS-CoV-2 by FDA under an Emergency Use Authorization (EUA). This EUA will remain in effect (meaning this test can be used) for the duration of the COVID-19 declaration under Section 564(b)(1) of the Act, 21 U.S.C. section 360bbb-3(b)(1), unless the authorization is terminated or revoked.     Resp Syncytial Virus by PCR NEGATIVE NEGATIVE Final    Comment: (NOTE) Fact Sheet for Patients: BloggerCourse.com  Fact Sheet for  Healthcare Providers: SeriousBroker.it  This test is not yet approved or cleared by the [place]  FDA and has been authorized for detection and/or diagnosis of SARS-CoV-2 by FDA under an Emergency Use Authorization (EUA). This EUA will remain in effect (meaning this test can be used) for the duration of the COVID-19 declaration under Section 564(b)(1) of the Act, 21 U.S.C. section 360bbb-3(b)(1), unless the authorization is terminated or revoked.  Performed at [facility], [address] [place], [place] [address]          Radiology Studies: CARDIAC CATHETERIZATION Result Date: 05/17/2024 Right heart catheterization via the right brachial vein showed mildly elevated filling pressures, moderate pulmonary hypertension and normal cardiac output. RA: 8 mmHg, RV 51/2 mmHg, PW:  18 mmHg, PA: 50/10 with a mean of 28 mmHg, cardiac output is 4.64 with an index of 2.92. Recommendations: She can likely be switched to an oral diuretic tomorrow.       Scheduled Meds:  apixaban   2.5 mg Oral BID   diazepam   5 mg Oral QHS   docusate sodium   100 mg Oral BID   empagliflozin   10 mg Oral q AM   feeding supplement  1 Container Oral BID BM   feeding supplement  237 mL Oral BID BM   furosemide   80 mg Intravenous BID   guaiFENesin   600 mg Oral BID   midodrine   5 mg Oral BID WC   multivitamin with minerals  1 tablet Oral Daily   neomycin -bacitracin -polymyxin   Topical BID   potassium chloride   60 mEq Oral Once   potassium chloride   40 mEq Oral Daily   senna  1 tablet Oral BID   sodium chloride  flush  3 mL Intravenous Q12H   sodium chloride  flush  3 mL Intravenous Q12H   zinc  sulfate (50mg  elemental zinc )  220 mg Oral Daily   Continuous Infusions:  sodium chloride         LOS: 4 days   Time spent:  Elsie JAYSON Montclair, DO [facility]  If 7PM-7AM, please contact night-coverage [email or web address]  05/18/2024, 7:45 AM

## 2024-05-18 NOTE — Consult Note (Addendum)
 [facility] CONSULT NOTE  Patient Care Team: Micheal Wolm ORN, MD as PCP - General (Family Medicine) Raford Riggs, MD as PCP - Cardiology (Cardiology) Kennyth Chew, MD as PCP - Electrophysiology (Cardiology) Lionell Jon DEL, [facility] (Pharmacist) Harden Jerona GAILS, MD as Consulting Physician (Orthopedic Surgery)  CHIEF COMPLAINTS/PURPOSE OF CONSULTATION:  High M-spike/pancytopenia/proteinuria   REFERRING PHYSICIAN: Dr. Lue  HISTORY OF PRESENTING ILLNESS:  Catherine [name] 89 y.o. female who was admitted on 05/14/2024 with complaints of leg swelling. Patient previously admitted in July with work-up for abnormal proteins done at that time which showed M-spike on serum and urine labs.  Therefore oncology evaluation being requested at this time. Patient seen awake and alert sitting up in chair at bedside. She is a lovely lady who serves as principal historian.  She recounts losing 35 lbs since last Christmas and overall not feeling well for several months.  She became very concerned about swollen legs over the week prior to admission and came to get evaluated.  Denies chest pain or acute bleeding.   Medical history significant for AKI, rheumatic fever, hypertension and CAD.  Surgical history includes back surgery, cholecystectomy, eye surgery, cardiac cath and mitral valve replacement.  Family oncologic history significant for father with leukemia. Social history includes former tobacco use x15 years, 1 PPD, quit in 1977. Denies alcohol use. Denies illicit drug use. 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 Married for 69 years.      I have reviewed her chart and materials related to her cancer extensively and collaborated history with the patient. Summary of oncologic history is as follows: Oncology History   No history exists.    ASSESSMENT & PLAN:  Abnormal Proteins  -- Kappa FLC done  04/23/24 extremely elevated 1234.4.  -- SPEP shows M-spike noted at 0.1% with Urine protein electrophoresis 2.6.    -- Seen by Nephrology with concern for amyloidosis and/or multiple myeloma.  -- Bone marrow biopsy is consideration.  -- Medical Oncology/Dr. Timmy will make further evaluation and treatment recommendations.  Anemia Thrombocytopenia  --Mild --Hemoglobin 10.8 and platelets 138K today --No transfusional intervention required at this time --Continue to monitor CBC with differential  CHF with LE edema Afib Pleural effusion -- On Eliquis  -- Status post mitral valve replacement in 2018 -- Continue Cardio/Medical management  CKD Proteinuria  -- creatinine and BUN elevated -- Avoid nephrotoxic agents -- monitor renal function -- Nephrology following  Hypertension/Hypotension Diabetes -- Monitor blood pressure closely -- Monitor blood glucose levels -- Nephrology and Primary teams following        MEDICAL HISTORY:  Past Medical History:  Diagnosis Date   AKI (acute kidney injury) (HCC) 06/22/2022   Aortic stenosis, mild 07/26/2017   Arthritis    Asthma    last attack 02/2015   Cellulitis of left lower extremity 08/17/2016   Ulcer associated with severe venous insufficiency   Chronic anticoagulation    Chronic diastolic CHF (congestive heart failure) (HCC)    Chronic kidney disease    RIGHT MANY KIDNEY INFECTIONS AND STONES   COPD (chronic obstructive pulmonary disease) (HCC)    Coronary artery disease    Dizziness    H/O: rheumatic fever    Hypertension    PAF (paroxysmal atrial fibrillation) (HCC)    PONV (postoperative nausea and vomiting)    ' SOMETIMES', BUT NOT ALWAYS   Primary hypertension    RHEUMATIC MITRAL STENOSIS  01/05/2008   Qualifier: Diagnosis of   By: Neysa MD, Clinton D        S/P Maze operation for atrial fibrillation 10/14/2016   Complete bilateral atrial lesion set using cryothermy and bipolar radiofrequency ablation - atrial  appendage was not treated due to previous surgical procedure (open mitral commissurotomy)   S/P mitral valve replacement with bioprosthetic valve 10/14/2016   29 mm Medtronic Mosaic porcine bioprosthetic tissue valve   Sciatica 08/29/2023   Tricuspid regurgitation 07/14/2021   UTI (urinary tract infection)    Valvular heart disease    Has mitral stenosis with prior mitral commissurotomy in 1970    SURGICAL HISTORY: Past Surgical History:  Procedure Laterality Date   ABDOMINAL HYSTERECTOMY  1983   endometriosis   APPENDECTOMY     BACK SURGERY     neurosurgery x2   CARDIAC CATHETERIZATION     CARDIAC CATHETERIZATION N/A 08/03/2016   Procedure: Right/Left Heart Cath and Coronary Angiography;  Surgeon: Oryan Winterton M [place], MD;  Location: [facility] INVASIVE CV LAB;  Service: Cardiovascular;  Laterality: N/A;   cataract surg     CHOLECYSTECTOMY N/A 05/30/2015   Procedure: LAPAROSCOPIC CHOLECYSTECTOMY WITH INTRAOPERATIVE CHOLANGIOGRAM;  Surgeon: Morene Olives, MD;  Location: [facility] OR;  Service: General;  Laterality: N/A;   COLONOSCOPY     CORONARY ARTERY BYPASS GRAFT N/A 10/14/2016   Procedure: CORONARY ARTERY BYPASS GRAFTING (CABG);  Surgeon: Sudie VEAR Laine, MD;  Location: [facility] OR;  Service: Open Heart Surgery;  Laterality: N/A;   EYE SURGERY     IR THORACENTESIS ASP PLEURAL SPACE W/IMG GUIDE  04/24/2024   MAZE N/A 10/14/2016   Procedure: MAZE;  Surgeon: Sudie VEAR Laine, MD;  Location: [facility] OR;  Service: Open Heart Surgery;  Laterality: N/A;   MITRAL VALVE REPLACEMENT N/A 10/14/2016   Procedure: REDO MITRAL VALVE REPLACEMENT (MVR);  Surgeon: Sudie VEAR Laine, MD;  Location: [facility] OR;  Service: Open Heart Surgery;  Laterality: N/A;   MITRAL VALVE SURGERY Left 1970   Open mitral commissurotomy via left thoracotomy approach   PACEMAKER IMPLANT N/A 09/22/2023   Procedure: PACEMAKER IMPLANT;  Surgeon: Kennyth Chew, MD;  Location: [facility] INVASIVE CV LAB;  Service: Cardiovascular;  Laterality: N/A;   RIGHT HEART  CATH N/A 05/17/2024   Procedure: RIGHT HEART CATH;  Surgeon: Darron Deatrice LABOR, MD;  Location: [facility] INVASIVE CV LAB;  Service: Cardiovascular;  Laterality: N/A;   TEE WITHOUT CARDIOVERSION N/A 07/05/2016   Procedure: TRANSESOPHAGEAL ECHOCARDIOGRAM (TEE);  Surgeon: Annabella Scarce, MD;  Location: [facility] ENDOSCOPY;  Service: Cardiovascular;  Laterality: N/A;   TEE WITHOUT CARDIOVERSION N/A 10/14/2016   Procedure: TRANSESOPHAGEAL ECHOCARDIOGRAM (TEE);  Surgeon: Sudie VEAR Laine, MD;  Location: [facility] OR;  Service: Open Heart Surgery;  Laterality: N/A;    SOCIAL HISTORY: Social History   Socioeconomic History   Marital status: Married    Spouse name: Not on file   Number of children: Not on file   Years of education: Not on file   Highest education level: Not on file  Occupational History   Not on file  Tobacco Use   Smoking status: Former    Current packs/day: 0.00    Average packs/day: 1 pack/day for 15.0 years (15.0 ttl pk-yrs)    Types: Cigarettes    Start date: 09/27/1960    Quit date: 09/28/1975    Years since quitting: 48.6   Smokeless tobacco: Never  Vaping Use   Vaping status: Never Used  Substance and Sexual Activity   Alcohol use: No  Alcohol/week: 0.0 standard drinks of alcohol   Drug use: No   Sexual activity: Yes  Other Topics Concern   Not on file  Social History Narrative   Not on file   Social Drivers of Health   Financial Resource Strain: Low Risk  (04/09/2022)   Overall Financial Resource Strain (CARDIA)    Difficulty of Paying Living Expenses: Not hard at all  Food Insecurity: No Food Insecurity (05/14/2024)   Hunger Vital Sign    Worried About Running Out of Food in the Last Year: Never true    Ran Out of Food in the Last Year: Never true  Transportation Needs: No Transportation Needs (05/14/2024)   PRAPARE - Administrator, Civil Service (Medical): No    Lack of Transportation (Non-Medical): No  Physical Activity: Insufficiently Active (04/09/2022)    Exercise Vital Sign    Days of Exercise per Week: 5 days    Minutes of Exercise per Session: 20 min  Stress: No Stress Concern Present (04/09/2022)   [employer] of Occupational Health - Occupational Stress Questionnaire    Feeling of Stress : Not at all  Social Connections: Socially Integrated (05/14/2024)   Social Connection and Isolation Panel    Frequency of Communication with Friends and Family: More than three times a week    Frequency of Social Gatherings with Friends and Family: More than three times a week    Attends Religious Services: More than 4 times per year    Active Member of [employer] or Organizations: Yes    Attends Engineer, structural: More than 4 times per year    Marital Status: Married  Catering manager Violence: Not At Risk (05/14/2024)   Humiliation, Afraid, Rape, and Kick questionnaire    Fear of Current or Ex-Partner: No    Emotionally Abused: No    Physically Abused: No    Sexually Abused: No    FAMILY HISTORY: Family History  Problem Relation Age of Onset   Leukemia Father    Breast cancer Neg Hx      PHYSICAL EXAMINATION: ECOG PERFORMANCE STATUS: 2 - Symptomatic, <50% confined to bed  Vitals:   05/18/24 0751 05/18/24 1257  BP: (!) 98/42 (!) 116/54  Pulse: 69 79  Resp: 15 18  Temp: 97.9 F (36.6 C) 97.7 F (36.5 C)  SpO2: 93% 98%   Filed Weights   05/16/24 0506 05/17/24 0443 05/18/24 0627  Weight: 124 lb 9.6 oz (56.5 kg) 125 lb 6.4 oz (56.9 kg) 119 lb 8 oz (54.2 kg)    GENERAL: alert, no distress and comfortable +thin-appearing SKIN: +multiple ecchymotic areas bil UE +small red spots on face EYES: normal, conjunctiva are pink and non-injected, sclera clear OROPHARYNX: no exudate, no erythema and lips, buccal mucosa, and tongue normal  NECK: supple, thyroid  normal size, non-tender, without nodularity LYMPH: no palpable lymphadenopathy in the cervical, axillary or inguinal LUNGS: clear to auscultation and percussion with normal  breathing effort HEART: regular rate & rhythm and no murmurs and no lower extremity edema ABDOMEN: abdomen soft, non-tender and normal bowel sounds MUSCULOSKELETAL: no cyanosis of digits and no clubbing  PSYCH: alert & oriented x 3 with fluent speech NEURO: no focal motor/sensory deficits   ALLERGIES:  is allergic to aldactone  [spironolactone ], amoxil  [amoxicillin ], zebeta  [bisoprolol ], calan  [verapamil ], crestor  [rosuvastatin ], flovent  [fluticasone  propionate], livalo  [pitavastatin ], lyrica  [pregabalin ], neurontin [gabapentin], norvasc  [amlodipine ], zetia  [ezetimibe ], zocor  [simvastatin ], lotensin [benazepril], and polytrim  [polymyxin b -trimethoprim ].  MEDICATIONS:  Current Facility-Administered Medications  Medication Dose Route  Frequency Provider Last Rate Last Admin   0.9 %  sodium chloride  infusion  250 mL Intravenous PRN Arida, Muhammad A, MD       acetaminophen  (TYLENOL ) tablet 650 mg  650 mg Oral Q6H PRN Darron Deatrice LABOR, MD   650 mg at 05/16/24 1658   Or   acetaminophen  (TYLENOL ) suppository 650 mg  650 mg Rectal Q6H PRN Darron Deatrice LABOR, MD       albuterol  (PROVENTIL ) (2.5 MG/3ML) 0.083% nebulizer solution 2.5 mg  2.5 mg Nebulization Q6H PRN Darron Deatrice LABOR, MD   2.5 mg at 05/18/24 0546   apixaban  (ELIQUIS ) tablet 2.5 mg  2.5 mg Oral BID Arida, Muhammad A, MD   2.5 mg at 05/18/24 9161   bisacodyl  (DULCOLAX) suppository 10 mg  10 mg Rectal Daily PRN Darron Deatrice LABOR, MD   10 mg at 05/16/24 2054   diazepam  (VALIUM ) tablet 5 mg  5 mg Oral QHS Arida, Muhammad A, MD   5 mg at 05/17/24 2025   docusate sodium  (COLACE) capsule 100 mg  100 mg Oral BID Arida, Muhammad A, MD   100 mg at 05/17/24 2025   empagliflozin  (JARDIANCE ) tablet 10 mg  10 mg Oral q AM Darron Deatrice LABOR, MD   10 mg at 05/18/24 9161   feeding supplement (BOOST / RESOURCE BREEZE) liquid 1 Container  1 Container Oral BID BM Darron Deatrice LABOR, MD   1 Container at 05/18/24 1051   feeding supplement (ENSURE PLUS HIGH  PROTEIN) liquid 237 mL  237 mL Oral BID BM Arida, Muhammad A, MD   237 mL at 05/16/24 1316   fentaNYL  (SUBLIMAZE ) injection 12.5-50 mcg  12.5-50 mcg Intravenous Q2H PRN Darron Deatrice LABOR, MD       guaiFENesin  (MUCINEX ) 12 hr tablet 600 mg  600 mg Oral BID Arida, Muhammad A, MD   600 mg at 05/18/24 9161   guaiFENesin  (MUCINEX ) 12 hr tablet 600 mg  600 mg Oral BID PRN Darron Deatrice LABOR, MD   600 mg at 05/17/24 1842   HYDROcodone -acetaminophen  (NORCO/VICODIN) 5-325 MG per tablet 1-2 tablet  1-2 tablet Oral Q4H PRN Arida, Muhammad A, MD   1 tablet at 05/17/24 2313   midodrine  (PROAMATINE ) tablet 10 mg  10 mg Oral TID WC Lue Elsie BROCKS, MD   10 mg at 05/18/24 1255   multivitamin with minerals tablet 1 tablet  1 tablet Oral Daily Arida, Muhammad A, MD   1 tablet at 05/18/24 9160   neomycin -bacitracin -polymyxin (NEOSPORIN) ointment   Topical BID Lue Elsie BROCKS, MD   Given at 05/17/24 2025   ondansetron  (ZOFRAN ) tablet 4 mg  4 mg Oral Q6H PRN Darron Deatrice LABOR, MD       Or   ondansetron  (ZOFRAN ) injection 4 mg  4 mg Intravenous Q6H PRN Darron Deatrice LABOR, MD   4 mg at 05/16/24 1703   Oral care mouth rinse  15 mL Mouth Rinse PRN Arida, Muhammad A, MD       polyethylene glycol (MIRALAX  / GLYCOLAX ) packet 17 g  17 g Oral Daily PRN Darron Deatrice LABOR, MD       potassium chloride  (KLOR-CON  M) CR tablet 40 mEq  40 mEq Oral Daily Lue Elsie BROCKS, MD   40 mEq at 05/18/24 1047   pregabalin  (LYRICA ) capsule 75 mg  75 mg Oral BID Lue Elsie BROCKS, MD   75 mg at 05/18/24 1047   senna (SENOKOT) tablet 8.6 mg  1 tablet Oral BID Arida, Muhammad A,  MD   8.6 mg at 05/17/24 2025   sodium chloride  flush (NS) 0.9 % injection 3 mL  3 mL Intravenous Q12H Darron Grass A, MD   3 mL at 05/17/24 2026   sodium chloride  flush (NS) 0.9 % injection 3 mL  3 mL Intravenous PRN Darron Grass LABOR, MD       sodium chloride  flush (NS) 0.9 % injection 3 mL  3 mL Intravenous Q12H Darron Grass LABOR, MD   3 mL at 05/18/24  0840   sodium chloride  flush (NS) 0.9 % injection 3 mL  3 mL Intravenous PRN Darron Grass LABOR, MD       zinc  sulfate (50mg  elemental zinc ) capsule 220 mg  220 mg Oral Daily Darron Grass A, MD   220 mg at 05/18/24 0839     LABORATORY DATA:  I have reviewed the data as listed Lab Results  Component Value Date   WBC 6.1 05/16/2024   HGB 10.2 (L) 05/17/2024   HGB 10.5 (L) 05/17/2024   HCT 30.0 (L) 05/17/2024   HCT 31.0 (L) 05/17/2024   MCV 90.7 05/16/2024   PLT 138 (L) 05/16/2024   Recent Labs    04/15/24 1604 04/17/24 0531 04/23/24 1105 04/24/24 0431 04/25/24 0452 05/14/24 0731 05/15/24 0449 05/16/24 0426 05/17/24 0429 05/17/24 1408 05/18/24 0537  NA 137   < > 133*   < > 132* 135 139 137 140 136  137 138  K 2.8*   < > 4.2   < > 4.8 4.3 3.7 2.7* 3.6 2.9*  2.9* 2.8*  CL 97*   < > 97*   < > 98 95* 98 97* 99  --  98  CO2 26   < > 27   < > 28 30 31 31 30   --  32  GLUCOSE 134*   < > 128*   < > 100* 118* 102* 100* 95  --  100*  BUN 29*   < > 42*   < > 42* 33* 29* 29* 28*  --  28*  CREATININE 1.32*   < > 1.38*   < > 1.22* 1.34* 1.16* 1.26* 1.20*  --  1.27*  CALCIUM  9.2   < > 9.3   < > 9.6 10.0 9.4 8.7* 8.7*  --  9.0  GFRNONAA 39*   < > 37*   < > 43* 38* 46* 42* 44*  --  41*  PROT 5.5*  --  4.9*  --   --  5.4* 4.9*  --   --   --   --   ALBUMIN  2.8*  --  2.2*   < > 2.0* 2.8* 2.0*  --   --   --   --   AST 36  --  27  --   --  33 28  --   --   --   --   ALT 10  --  12  --   --  13 14  --   --   --   --   ALKPHOS 94  --  67  --   --  84 60  --   --   --   --   BILITOT 0.2  --  0.4  --   --  0.3 0.6  --   --   --   --   BILIDIR <0.1  --   --   --   --   --   --   --   --   --   --  IBILI NOT CALCULATED  --   --   --   --   --   --   --   --   --   --    < > = values in this interval not displayed.    RADIOGRAPHIC STUDIES: I have personally reviewed the radiological images as listed and agreed with the findings in the report. CARDIAC CATHETERIZATION Result Date:  05/17/2024 Right heart catheterization via the right brachial vein showed mildly elevated filling pressures, moderate pulmonary hypertension and normal cardiac output. RA: 8 mmHg, RV 51/2 mmHg, PW: 18 mmHg, PA: 50/10 with a mean of 28 mmHg, cardiac output is 4.64 with an index of 2.92. Recommendations: She can likely be switched to an oral diuretic tomorrow.  DG Chest 2 View Result Date: 05/14/2024 CLINICAL DATA:  Shortness of breath. Cough. Bilateral leg swelling. EXAM: CHEST - 2 VIEW COMPARISON:  04/24/2024 and CT chest 09/10/2023. FINDINGS: Heart is enlarged, as before. Thoracic aorta is calcified. Pacemaker lead tips are in the right atrium and right ventricle. Mild interstitial prominence and indistinctness with a moderate right pleural effusion and small left pleural effusion. IMPRESSION: Congestive heart failure. Electronically Signed   By: Newell Eke M.D.   On: 05/14/2024 08:18   ECHOCARDIOGRAM COMPLETE Result Date: 04/24/2024    ECHOCARDIOGRAM REPORT   Patient Name:   Catherine [name] Date of Exam: 04/24/2024 Medical Rec #:  [id number]      Height:       63.0 in Accession #:    [id number]     Weight:       115.0 lb Date of Birth:  May 14, 1935     BSA:          1.528 m Patient Age:    88 years       BP:           139/64 mmHg Patient Gender: F              HR:           61 bpm. Exam Location:  Inpatient Procedure: 2D Echo, Cardiac Doppler and Color Doppler (Both Spectral and Color            Flow Doppler were utilized during procedure). Indications:    CHF-Acute Diastolic I50.31  History:        Patient has prior history of Echocardiogram examinations, most                 recent 11/02/2023. CHF, CAD, Prior CABG, COPD and CKD, stage 3,                 Arrythmias:Atrial Fibrillation, Signs/Symptoms:Dyspnea; Risk                 Factors:Hypertension and Dyslipidemia.                  Mitral Valve: 29 mm Medtronic bioprosthetic valve valve is                 present in the mitral position. Procedure Date:  10/14/2016.  Sonographer:    Thea Norlander RCS Referring Phys: [id number] PAULA V ROSS IMPRESSIONS  1. No resting LVOT gradient. Left ventricular ejection fraction, by estimation, is 70 to 75%. The left ventricle has hyperdynamic function. The left ventricle has no regional wall motion abnormalities. Left ventricular diastolic parameters are indeterminate.  2. Right ventricular systolic function is mildly reduced. The right ventricular size is normal. There is moderately elevated pulmonary artery  systolic pressure. The estimated right ventricular systolic pressure is 50.5 mmHg.  3. Left atrial size was severely dilated.  4. Right atrial size was mildly dilated.  5. Small, echodensity noted on the chordal apparatus unchanged from 2023 PLAX view. There new LVOT is small related to valve struts with LVOT flow acceleration. The mitral valve has been repaired/replaced. No evidence of mitral valve regurgitation. The mean mitral valve gradient is 8.0 mmHg. There is a 29 mm Medtronic bioprosthetic valve present in the mitral position. Procedure Date: 10/14/2016.  6. Tricuspid valve regurgitation is moderate.  7. The aortic valve is tricuspid. Aortic valve regurgitation is not visualized. Mild to moderate aortic valve stenosis. Aortic valve area, by VTI measures 1.04 cm. Aortic valve mean gradient measures 19.0 mmHg. Aortic valve Vmax measures 3.02 m/s.  8. The inferior vena cava is dilated in size with <50% respiratory variability, suggesting right atrial pressure of 15 mmHg. Comparison(s): Prior images reviewed side by side. Mitral gradients have increased from 2023 with similar heart rates (60s); aortic valve gradients have increased; a portion of this is related to dynamic LV function. FINDINGS  Left Ventricle: No resting LVOT gradient. Left ventricular ejection fraction, by estimation, is 70 to 75%. The left ventricle has hyperdynamic function. The left ventricle has no regional wall motion abnormalities. The left  ventricular internal cavity size was normal in size. There is no left ventricular hypertrophy. Left ventricular diastolic parameters are indeterminate. Right Ventricle: The right ventricular size is normal. No increase in right ventricular wall thickness. Right ventricular systolic function is mildly reduced. There is moderately elevated pulmonary artery systolic pressure. The tricuspid regurgitant velocity is 2.98 m/s, and with an assumed right atrial pressure of 15 mmHg, the estimated right ventricular systolic pressure is 50.5 mmHg. Left Atrium: Left atrial size was severely dilated. Right Atrium: Right atrial size was mildly dilated. Pericardium: There is no evidence of pericardial effusion. Mitral Valve: Small, echodensity noted on the chordal apparatus unchanged from 2023 PLAX view. There new LVOT is small related to valve struts with LVOT flow acceleration. The mitral valve has been repaired/replaced. No evidence of mitral valve regurgitation. There is a 29 mm Medtronic bioprosthetic valve present in the mitral position. Procedure Date: 10/14/2016. MV peak gradient, 23.7 mmHg. The mean mitral valve gradient is 8.0 mmHg with average heart rate of 60 bpm. Tricuspid Valve: The tricuspid valve is normal in structure. Tricuspid valve regurgitation is moderate . No evidence of tricuspid stenosis. Aortic Valve: The aortic valve is tricuspid. Aortic valve regurgitation is not visualized. Mild to moderate aortic stenosis is present. Aortic valve mean gradient measures 19.0 mmHg. Aortic valve peak gradient measures 36.5 mmHg. Aortic valve area, by VTI measures 1.04 cm. Pulmonic Valve: The pulmonic valve was normal in structure. Pulmonic valve regurgitation is trivial. No evidence of pulmonic stenosis. Aorta: The aortic root and ascending aorta are structurally normal, with no evidence of dilitation. Venous: The inferior vena cava is dilated in size with less than 50% respiratory variability, suggesting right atrial  pressure of 15 mmHg. IAS/Shunts: The atrial septum is grossly normal.  LEFT VENTRICLE PLAX 2D LVIDd:         4.57 cm   Diastology LVIDs:         3.08 cm   LV e' medial:    6.16 cm/s LV PW:         0.64 cm   LV E/e' medial:  32.3 LV IVS:        0.93 cm  LV e' lateral:   8.92 cm/s LVOT diam:     1.60 cm   LV E/e' lateral: 22.3 LV SV:         63 LV SV Index:   41 LVOT Area:     2.01 cm  RIGHT VENTRICLE            IVC RV S prime:     7.22 cm/s  IVC diam: 2.27 cm TAPSE (M-mode): 1.6 cm LEFT ATRIUM              Index        RIGHT ATRIUM           Index LA diam:        5.80 cm  3.79 cm/m   RA Area:     21.15 cm LA Vol (A2C):   97.8 ml  63.99 ml/m  RA Volume:   59.55 ml  38.96 ml/m LA Vol (A4C):   119.0 ml 77.86 ml/m LA Biplane Vol: 108.0 ml 70.66 ml/m  AORTIC VALVE AV Area (Vmax):    1.09 cm AV Area (Vmean):   1.05 cm AV Area (VTI):     1.04 cm AV Vmax:           302.00 cm/s AV Vmean:          198.000 cm/s AV VTI:            0.604 m AV Peak Grad:      36.5 mmHg AV Mean Grad:      19.0 mmHg LVOT Vmax:         164.00 cm/s LVOT Vmean:        103.000 cm/s LVOT VTI:          0.313 m LVOT/AV VTI ratio: 0.52  AORTA Ao Root diam: 3.15 cm Ao Asc diam:  3.50 cm MITRAL VALVE                TRICUSPID VALVE MV Area (PHT): 2.34 cm     TR Peak grad:   35.5 mmHg MV Area VTI:   0.94 cm     TR Vmax:        298.00 cm/s MV Peak grad:  23.7 mmHg MV Mean grad:  8.0 mmHg     SHUNTS MV Vmax:       2.44 m/s     Systemic VTI:  0.31 m MV Vmean:      110.0 cm/s   Systemic Diam: 1.60 cm MV Decel Time: 324 msec MV E velocity: 199.00 cm/s Stanly Leavens MD Electronically signed by Stanly Leavens MD Signature Date/Time: 04/24/2024/5:19:23 PM    Final    IR THORACENTESIS ASP PLEURAL SPACE W/IMG GUIDE Result Date: 04/24/2024 INDICATION: In the hospital with CHF exacerbation. Right pleural effusion. IR consulted for diagnostic and therapeutic right thoracentesis. EXAM: ULTRASOUND GUIDED DIAGNOSTIC AND THERAPEUTIC RIGHT  THORACENTESIS MEDICATIONS: 6 mL 1% lidocaine  COMPLICATIONS: None immediate. PROCEDURE: An ultrasound guided thoracentesis was thoroughly discussed with the patient and questions answered. The benefits, risks, alternatives and complications were also discussed. The patient understands and wishes to proceed with the procedure. Written consent was obtained. Ultrasound was performed to localize and mark an adequate pocket of fluid in the right chest. The area was then prepped and draped in the normal sterile fashion. 1% Lidocaine  was used for local anesthesia. Under ultrasound guidance a 6 Fr Safe-T-Centesis catheter was introduced. Thoracentesis was performed. The catheter was removed and a dressing applied. FINDINGS: A total of approximately 1.1 liters  of clear yellow fluid was removed. Samples were sent to the laboratory as requested by the clinical team. IMPRESSION: Successful ultrasound guided right thoracentesis yielding 1.1 liters of pleural fluid. Performed and dictated by Kimble Clas, PA-C Electronically Signed   By: CHRISTELLA.  Shick M.D.   On: 04/24/2024 14:42   DG Chest Port 1 View Result Date: 04/24/2024 CLINICAL DATA:  Status post thoracentesis. EXAM: PORTABLE CHEST 1 VIEW COMPARISON:  Chest radiograph dated 04/24/2019 side. FINDINGS: Interval decrease in the size of right pleural effusion with improved aeration of the right lung base. No pneumothorax. Small left pleural effusion as seen on the prior radiograph. There is cardiomegaly with vascular congestion. Median sternotomy wires and left pectoral pacemaker device. No acute osseous pathology. IMPRESSION: Interval decrease in the size of right pleural effusion with improved aeration of the right lung base. No pneumothorax. Electronically Signed   By: Vanetta Chou M.D.   On: 04/24/2024 14:30   DG Chest 1 View Result Date: 04/23/2024 CLINICAL DATA:  Follow-up EXAM: CHEST  1 VIEW COMPARISON:  04/15/2024 FINDINGS: Enlarging right pleural effusion, now  moderate. Stable small left pleural effusion. Bilateral lower lobe airspace opacities are noted, right greater than left, worsening bilaterally since prior study. There is cardiomegaly. Pacer remains in place, unchanged. Prior CABG. IMPRESSION: Enlarging moderate right pleural effusion. Stable small left pleural effusion. Worsening bilateral lower lobe airspace disease, right greater than left. Cardiomegaly. Electronically Signed   By: Franky Crease M.D.   On: 04/23/2024 18:51     The total time spent in the appointment was 55 minutes encounter with patients including review of chart and various tests results, discussions about plan of care and coordination of care plan   All questions were answered. The patient knows to call the clinic with any problems, questions or concerns. No barriers to learning was detected.  Olam JINNY Brunner, NP 8/22/20251:36 PM  ADDENDUM: I saw and examined Ms. Yore.  She is incredibly nice.  She actually is related to the [employer] group.  They are very well-known and world famous.  I had believe that she has myeloma or least amyloidosis.  She has an incredibly high serum kappa light chain.  I had a long talk with her.  I suspect that we could treat her.  Her treatments are very well-tolerated.  We could probably use subcutaneous therapy with daratumumab and Velcade.  I think both these would work very well.  She needs to have a bone marrow biopsy done to see if there is actually myeloma.  The bone marrow will need to be stained for amyloid.  She also needs to have a 24-hour urine done so we see how much Kappa light chain is being excreted into her urine.  She has an incredibly high BNP.  I am not surprised.  An echocardiogram that she had however did not show any obvious cardiac infiltration as far as I can tell.  She would like to have treatment at the [facility] facility.  We do have a cancer clinic there.  I can arrange for her to be seen by Dr.  Cloretta.  I know that she would like him.  We really need to get these test done.  I will also make sure we get a bone survey on her.  I told her that we could treat the amyloid/myeloma and yet she could still have problems with fluid retention.  Again, she has an incredibly strong faith.  We had a very good prayer  at the end of our meeting.  She seems to be in decent shape so I think we have a chance at helping her out.   Jeralyn Crease, MD  Inge 29:11

## 2024-05-18 NOTE — Progress Notes (Signed)
 Progress Note  Patient Name: Catherine [name] Date of Encounter: 05/18/2024 [facility] Cardiologist: Annabella Scarce, MD   Interval Summary   Patient reports poor night of sleep  Significant burning in her legs, and worrying about her husbands eye surgery this AM No improvement in LE edema Episodes of shortness of breath -- relieved by albuterol   Reports less urine output than baseline with Lasix  Hypokalemic and hypotensive this AM, held Lasix  today   Vital Signs Vitals:   05/18/24 0544 05/18/24 0546 05/18/24 0627 05/18/24 0751  BP: 120/68   (!) 98/42  Pulse: (!) 102   69  Resp: 16   15  Temp: 98 F (36.7 C)   97.9 F (36.6 C)  TempSrc: Oral   Oral  SpO2: 93% 93%  93%  Weight:   54.2 kg   Height:        Intake/Output Summary (Last 24 hours) at 05/18/2024 0917 Last data filed at 05/18/2024 0654 Gross per 24 hour  Intake 120 ml  Output 1400 ml  Net -1280 ml      05/18/2024    6:27 AM 05/17/2024    4:43 AM 05/16/2024    5:06 AM  Last 3 Weights  Weight (lbs) 119 lb 8 oz 125 lb 6.4 oz 124 lb 9.6 oz  Weight (kg) 54.205 kg 56.881 kg 56.518 kg     Telemetry/ECG  AV paced, HR 70s - Personally Reviewed  Physical Exam  GEN: No acute distress.   Neck: No JVD Cardiac: RRR, no murmurs, rubs, or gallops.  Respiratory: Clear to auscultation bilaterally. GI: Soft, nontender, non-distended  MS: 2+ LE edema, see below      Assessment & Plan  Acute on chronic HFpEF, NYHA IV  Moderate pulmonary hypertension  Presented with worsening LE edema, shortness of breath, cough x 1 week  CXR showed bilateral pleural effusion, pulmonary vascular congestion  Echo 03/2024 showed EF 70-75% BNP 15,910 Net - 4.3 L this admission Creatinine trending up this AM, 1.20 ? 1.27 Most recent BP 98/42, most BP low-normal She reports no significant increase in urine output and says she feels her shortness of breath is the same/possibly a bit worse today  Weight 119 lb this AM RHC  showed: mildly elevated filling pressures, moderate pulmonary HTN, normal cardiac output -- RA 8 mmHg, PW 18 mmHg Previously on IV Lasix  80 mg BID, added metolazone  at one point, neither aided with significant urine output  -- held IV Lasix  this morning given no improvement in LE edema, poor urine output, RHC results, hypokalemia and hypotension  Continue Jardiance  10 mg  Renal function limited GDMT, intolerant to beta-blockers  Currently requiring midodrine  5 mg BID  Continue strict I&O's, daily weights, daily BMPs    CAD s/p CABG x 1, 2018 Troponin level 159 ? 158 ? 181 ? 187   No active chest pain  Suspect secondary to demand ischemia  Not on ASA due to Eliquis  use  Intolerant to beta-blockers, statins  No plans for ischemic evaluation at this time    Sick sinus syndrome  Junctional tachycardia  S/p PPM Paroxysmal atrial fibrillation SVT Remains rate controlled  Planned SVT ablation in September  Pacemaker functioning appropriately  No current chest pain  Intolerant to beta-blockers  Plan to proceed with SVT ablation in September  Continue Eliquis  2.5 mg BID    Hyperlipidemia  04/09/2024: HDL 42; LDL Chol Calc (NIH) 127 05/15/2024: ALT 14  Intolerant to statins  LDL uncontrolled on Leqvio   S/p bioprostehtic MVR, 2018 Stable per echo 03/2024     Per primary  Nephrotic syndrome  Hypoalbuminemia Recent weight loss, decreased appetite  CKD 3a COPD Pleural effusions Electrolyte disturbances   For questions or updates, please contact [facility] Please consult [email or web address] for contact info under       Signed, Waddell DELENA Donath, PA-C

## 2024-05-18 NOTE — Consult Note (Signed)
 Nephrology Consult   Requesting provider: Elsie Montclair Service requesting consult: Hospitalist Reason for consult: proteinuria   Assessment/Recommendations: Catherine [name] is a/an 89 y.o. female with a past medical history CAD s/p CABG, s/p MVR, afib, HTN, HLD, CKD 3a who present w/ LEE c/b proteinuria and concern for paraproteinemia   Proteinuria: extensive and with low BP. Given M spike on SPEP and UPEP also with high SFLC; this is concerning for paraproteinemia. I would be most concerned for amyloidosis given UPEP findings as well as LEE, constipation, CHF, and bradycardia which can all be caused by amyloid protein deposition. Severe proteinuria is very common in amyloidosis and/or MM. -Recommend consulting hematology for further work up -We will sign off at this time -If further work up for paraproteinuria is negative let us  know and we can work up for other causes of proteinuria at that time; MGRS would be a concern  -Continue to monitor daily Cr, Dose meds for GFR -Monitor Daily I/Os, Daily weight  -Maintain MAP>65 for optimal renal perfusion.  -Avoid nephrotoxic medications including NSAIDs -Use synthetic opioids (Fentanyl /Dilaudid ) if needed  CHF: volume status improved per cardiology. Defer mgmt to them and primary  Hypotension: continue midodrine   LEE: likely multifactorial but is common in amyloidosis. Recommend compression and further work up as above  Hypokalemia: replete orally PRN  Hypoalbuminemia: nephropathy associated with likely paraproteinemia is likely contributing. Work up as above. Can use IV albumin  PRN if serum albumin  is <2  As above, we will sign off. Feel free to consult us  back if needed.  Recommendations conveyed to primary service.    [facility] 05/18/2024 11:48 AM   _____________________________________________________________________________________ CC: LEE  History of Present Illness: Catherine [name] is  a/an 89 y.o. female with a past medical history of CAD s/p CABG, s/p MVR, afib, HTN, HLD, CKD 3a who presents with worsening LEE.  Patient was admitted 4 days ago for worsening LEE for about 2 weeks. She also ahd some DOE and has had several hospitalizations for CHF exacerbations. Denied fevers, chills, chest pain, n/v/d. Has had constipation. Diuretics were adjusted outpatient without improvement in LEE. Does note significant weight loss this past year; about 30 pounds. She has had a poor appetite. She notes some pain in her legs associated with the edema. No swelling of her tongue but does say her mouth feels different.  She was admitted for further management and underwent diuresis as well as compression and noted improvement in LEE. However, she says if she takes off her compression the LEE returns. BP has been fairly low on midodrine . Also on SGLT2i. She has had persistnet SOB. She had a RHC which didn't demonstrate extensive volume overload. UPC was very high. At 11.4 in July and 18 on 8/20. Notably UA has been positive for protein since July of 2024 but was negative before that. It's also notable she had an M spike on a UPEP from 7/28 along with massively elevated kappa free light chain with ratio of 55 and SPEP with M spike of 0.1. Notably she also had bradycardia with pacemaker placed in December of 2024.   Medications:  Current Facility-Administered Medications  Medication Dose Route Frequency Provider Last Rate Last Admin   0.9 %  sodium chloride  infusion  250 mL Intravenous PRN Arida, Muhammad A, MD       acetaminophen  (TYLENOL ) tablet 650 mg  650 mg Oral Q6H PRN Darron Deatrice LABOR, MD   650 mg at 05/16/24 1658  Or   acetaminophen  (TYLENOL ) suppository 650 mg  650 mg Rectal Q6H PRN Darron Deatrice LABOR, MD       albuterol  (PROVENTIL ) (2.5 MG/3ML) 0.083% nebulizer solution 2.5 mg  2.5 mg Nebulization Q6H PRN Darron Deatrice LABOR, MD   2.5 mg at 05/18/24 0546   apixaban  (ELIQUIS ) tablet 2.5 mg   2.5 mg Oral BID Arida, Muhammad A, MD   2.5 mg at 05/18/24 9161   bisacodyl  (DULCOLAX) suppository 10 mg  10 mg Rectal Daily PRN Darron Deatrice LABOR, MD   10 mg at 05/16/24 2054   diazepam  (VALIUM ) tablet 5 mg  5 mg Oral QHS Arida, Muhammad A, MD   5 mg at 05/17/24 2025   docusate sodium  (COLACE) capsule 100 mg  100 mg Oral BID Arida, Muhammad A, MD   100 mg at 05/17/24 2025   empagliflozin  (JARDIANCE ) tablet 10 mg  10 mg Oral q AM Arida, Muhammad A, MD   10 mg at 05/18/24 9161   feeding supplement (BOOST / RESOURCE BREEZE) liquid 1 Container  1 Container Oral BID BM Darron Deatrice LABOR, MD   1 Container at 05/18/24 1051   feeding supplement (ENSURE PLUS HIGH PROTEIN) liquid 237 mL  237 mL Oral BID BM Arida, Muhammad A, MD   237 mL at 05/16/24 1316   fentaNYL  (SUBLIMAZE ) injection 12.5-50 mcg  12.5-50 mcg Intravenous Q2H PRN Darron Deatrice LABOR, MD       guaiFENesin  (MUCINEX ) 12 hr tablet 600 mg  600 mg Oral BID Arida, Muhammad A, MD   600 mg at 05/18/24 9161   guaiFENesin  (MUCINEX ) 12 hr tablet 600 mg  600 mg Oral BID PRN Darron Deatrice LABOR, MD   600 mg at 05/17/24 1842   HYDROcodone -acetaminophen  (NORCO/VICODIN) 5-325 MG per tablet 1-2 tablet  1-2 tablet Oral Q4H PRN Darron Deatrice LABOR, MD   1 tablet at 05/17/24 2313   midodrine  (PROAMATINE ) tablet 10 mg  10 mg Oral TID WC Lancaster, William C, MD       multivitamin with minerals tablet 1 tablet  1 tablet Oral Daily Arida, Muhammad A, MD   1 tablet at 05/18/24 9160   neomycin -bacitracin -polymyxin (NEOSPORIN) ointment   Topical BID Lue Elsie BROCKS, MD   Given at 05/17/24 2025   ondansetron  (ZOFRAN ) tablet 4 mg  4 mg Oral Q6H PRN Darron Deatrice LABOR, MD       Or   ondansetron  (ZOFRAN ) injection 4 mg  4 mg Intravenous Q6H PRN Darron Deatrice LABOR, MD   4 mg at 05/16/24 1703   Oral care mouth rinse  15 mL Mouth Rinse PRN Arida, Muhammad A, MD       polyethylene glycol (MIRALAX  / GLYCOLAX ) packet 17 g  17 g Oral Daily PRN Darron Deatrice LABOR, MD        potassium chloride  (KLOR-CON  M) CR tablet 40 mEq  40 mEq Oral Daily Lue Elsie BROCKS, MD   40 mEq at 05/18/24 1047   potassium chloride  (KLOR-CON  M) CR tablet 40 mEq  40 mEq Oral NOW Lue Elsie BROCKS, MD       pregabalin  (LYRICA ) capsule 75 mg  75 mg Oral BID Lue Elsie BROCKS, MD   75 mg at 05/18/24 1047   senna (SENOKOT) tablet 8.6 mg  1 tablet Oral BID Arida, Muhammad A, MD   8.6 mg at 05/17/24 2025   sodium chloride  flush (NS) 0.9 % injection 3 mL  3 mL Intravenous Q12H Darron Deatrice LABOR, MD   3 mL  at 05/17/24 2026   sodium chloride  flush (NS) 0.9 % injection 3 mL  3 mL Intravenous PRN Darron Deatrice LABOR, MD       sodium chloride  flush (NS) 0.9 % injection 3 mL  3 mL Intravenous Q12H Darron Deatrice LABOR, MD   3 mL at 05/18/24 0840   sodium chloride  flush (NS) 0.9 % injection 3 mL  3 mL Intravenous PRN Darron Deatrice LABOR, MD       zinc  sulfate (50mg  elemental zinc ) capsule 220 mg  220 mg Oral Daily Arida, Muhammad A, MD   220 mg at 05/18/24 9160     ALLERGIES Aldactone  [spironolactone ], Amoxil  [amoxicillin ], Zebeta  [bisoprolol ], Calan  [verapamil ], Crestor  [rosuvastatin ], Flovent  [fluticasone  propionate], Livalo  [pitavastatin ], Lyrica  [pregabalin ], Neurontin [gabapentin], Norvasc  [amlodipine ], Zetia  [ezetimibe ], Zocor  [simvastatin ], Lotensin [benazepril], and Polytrim  [polymyxin b -trimethoprim ]  MEDICAL HISTORY Past Medical History:  Diagnosis Date   AKI (acute kidney injury) (HCC) 06/22/2022   Aortic stenosis, mild 07/26/2017   Arthritis    Asthma    last attack 02/2015   Cellulitis of left lower extremity 08/17/2016   Ulcer associated with severe venous insufficiency   Chronic anticoagulation    Chronic diastolic CHF (congestive heart failure) (HCC)    Chronic kidney disease    RIGHT MANY KIDNEY INFECTIONS AND STONES   COPD (chronic obstructive pulmonary disease) (HCC)    Coronary artery disease    Dizziness    H/O: rheumatic fever    Hypertension    PAF (paroxysmal  atrial fibrillation) (HCC)    PONV (postoperative nausea and vomiting)    ' SOMETIMES', BUT NOT ALWAYS   Primary hypertension    RHEUMATIC MITRAL STENOSIS 01/05/2008   Qualifier: Diagnosis of   By: Neysa MD, Clinton D        S/P Maze operation for atrial fibrillation 10/14/2016   Complete bilateral atrial lesion set using cryothermy and bipolar radiofrequency ablation - atrial appendage was not treated due to previous surgical procedure (open mitral commissurotomy)   S/P mitral valve replacement with bioprosthetic valve 10/14/2016   29 mm Medtronic Mosaic porcine bioprosthetic tissue valve   Sciatica 08/29/2023   Tricuspid regurgitation 07/14/2021   UTI (urinary tract infection)    Valvular heart disease    Has mitral stenosis with prior mitral commissurotomy in 1970     SOCIAL HISTORY Social History   Socioeconomic History   Marital status: Married    Spouse name: Not on file   Number of children: Not on file   Years of education: Not on file   Highest education level: Not on file  Occupational History   Not on file  Tobacco Use   Smoking status: Former    Current packs/day: 0.00    Average packs/day: 1 pack/day for 15.0 years (15.0 ttl pk-yrs)    Types: Cigarettes    Start date: 09/27/1960    Quit date: 09/28/1975    Years since quitting: 48.6   Smokeless tobacco: Never  Vaping Use   Vaping status: Never Used  Substance and Sexual Activity   Alcohol use: No    Alcohol/week: 0.0 standard drinks of alcohol   Drug use: No   Sexual activity: Yes  Other Topics Concern   Not on file  Social History Narrative   Not on file   Social Drivers of Health   Financial Resource Strain: Low Risk  (04/09/2022)   Overall Financial Resource Strain (CARDIA)    Difficulty of Paying Living Expenses: Not hard at all  Food Insecurity: No Food Insecurity (  05/14/2024)   Hunger Vital Sign    Worried About Running Out of Food in the Last Year: Never true    Ran Out of Food in the Last Year:  Never true  Transportation Needs: No Transportation Needs (05/14/2024)   PRAPARE - Administrator, Civil Service (Medical): No    Lack of Transportation (Non-Medical): No  Physical Activity: Insufficiently Active (04/09/2022)   Exercise Vital Sign    Days of Exercise per Week: 5 days    Minutes of Exercise per Session: 20 min  Stress: No Stress Concern Present (04/09/2022)   [employer] of Occupational Health - Occupational Stress Questionnaire    Feeling of Stress : Not at all  Social Connections: Socially Integrated (05/14/2024)   Social Connection and Isolation Panel    Frequency of Communication with Friends and Family: More than three times a week    Frequency of Social Gatherings with Friends and Family: More than three times a week    Attends Religious Services: More than 4 times per year    Active Member of [employer] or Organizations: Yes    Attends Banker Meetings: More than 4 times per year    Marital Status: Married  Catering manager Violence: Not At Risk (05/14/2024)   Humiliation, Afraid, Rape, and Kick questionnaire    Fear of Current or Ex-Partner: No    Emotionally Abused: No    Physically Abused: No    Sexually Abused: No     FAMILY HISTORY Family History  Problem Relation Age of Onset   Leukemia Father    Breast cancer Neg Hx       Review of Systems: 12 systems reviewed Otherwise as per HPI, all other systems reviewed and negative  Physical Exam: Vitals:   05/18/24 0546 05/18/24 0751  BP:  (!) 98/42  Pulse:  69  Resp:  15  Temp:  97.9 F (36.6 C)  SpO2: 93% 93%   Total I/O In: 360 [P.O.:360] Out: 200 [Urine:200]  Intake/Output Summary (Last 24 hours) at 05/18/2024 1148 Last data filed at 05/18/2024 1000 Gross per 24 hour  Intake 480 ml  Output 1400 ml  Net -920 ml   General: thin but well-appearing, no acute distress HEENT: anicteric sclera, oropharynx clear without lesions CV: normal rate, no rub, 1+ edema in  BLE Lungs: clear to auscultation bilaterally, normal work of breathing Abd: soft, non-tender, non-distended Skin: no visible lesions or rashes (BLE covered) Psych: alert, engaged, appropriate mood and affect Musculoskeletal: no obvious deformities Neuro: normal speech, no gross focal deficits   Test Results Reviewed Lab Results  Component Value Date   NA 138 05/18/2024   K 2.8 (L) 05/18/2024   CL 98 05/18/2024   CO2 32 05/18/2024   BUN 28 (H) 05/18/2024   CREATININE 1.27 (H) 05/18/2024   GFR 34.91 (L) 04/02/2024   CALCIUM  9.0 05/18/2024   ALBUMIN  2.0 (L) 05/15/2024   PHOS 4.3 05/15/2024    CBC Recent Labs  Lab 05/14/24 0731 05/15/24 0449 05/16/24 0426 05/17/24 1408  WBC 9.1 6.6 6.1  --   NEUTROABS 7.1  --   --   --   HGB 11.6* 11.5* 10.8* 10.5*  10.2*  HCT 35.0* 34.6* 32.2* 31.0*  30.0*  MCV 92.1 91.8 90.7  --   PLT 153 141* 138*  --     I have reviewed all relevant outside healthcare records related to the patient's current hospitalization

## 2024-05-18 NOTE — Plan of Care (Signed)
  Problem: Education: Goal: Knowledge of General Education information will improve Description: Including pain rating scale, medication(s)/side effects and non-pharmacologic comfort measures Outcome: Progressing   Problem: Clinical Measurements: Goal: Ability to maintain clinical measurements within normal limits will improve Outcome: Progressing   Problem: Activity: Goal: Risk for activity intolerance will decrease Outcome: Progressing   Problem: Nutrition: Goal: Adequate nutrition will be maintained Outcome: Progressing   Problem: Activity: Goal: Ability to return to baseline activity level will improve Outcome: Progressing   Problem: Coping: Goal: Level of anxiety will decrease Outcome: Not Met (add Reason) Note: On-going   Problem: Pain Managment: Goal: General experience of comfort will improve and/or be controlled Outcome: Not Met (add Reason) Note: On-going

## 2024-05-19 ENCOUNTER — Inpatient Hospital Stay (HOSPITAL_COMMUNITY)

## 2024-05-19 DIAGNOSIS — I5033 Acute on chronic diastolic (congestive) heart failure: Secondary | ICD-10-CM | POA: Diagnosis not present

## 2024-05-19 DIAGNOSIS — I509 Heart failure, unspecified: Secondary | ICD-10-CM | POA: Diagnosis not present

## 2024-05-19 DIAGNOSIS — N1831 Chronic kidney disease, stage 3a: Secondary | ICD-10-CM | POA: Diagnosis not present

## 2024-05-19 LAB — CBC WITH DIFFERENTIAL/PLATELET
Abs Immature Granulocytes: 0.03 K/uL (ref 0.00–0.07)
Basophils Absolute: 0 K/uL (ref 0.0–0.1)
Basophils Relative: 1 %
Eosinophils Absolute: 0.4 K/uL (ref 0.0–0.5)
Eosinophils Relative: 6 %
HCT: 33.5 % — ABNORMAL LOW (ref 36.0–46.0)
Hemoglobin: 10.9 g/dL — ABNORMAL LOW (ref 12.0–15.0)
Immature Granulocytes: 1 %
Lymphocytes Relative: 19 %
Lymphs Abs: 1.3 K/uL (ref 0.7–4.0)
MCH: 30.4 pg (ref 26.0–34.0)
MCHC: 32.5 g/dL (ref 30.0–36.0)
MCV: 93.3 fL (ref 80.0–100.0)
Monocytes Absolute: 0.6 K/uL (ref 0.1–1.0)
Monocytes Relative: 10 %
Neutro Abs: 4.3 K/uL (ref 1.7–7.7)
Neutrophils Relative %: 63 %
Platelets: 138 K/uL — ABNORMAL LOW (ref 150–400)
RBC: 3.59 MIL/uL — ABNORMAL LOW (ref 3.87–5.11)
RDW: 13.9 % (ref 11.5–15.5)
WBC: 6.6 K/uL (ref 4.0–10.5)
nRBC: 0 % (ref 0.0–0.2)

## 2024-05-19 LAB — LACTATE DEHYDROGENASE: LDH: 266 U/L — ABNORMAL HIGH (ref 98–192)

## 2024-05-19 LAB — MICROALBUMIN / CREATININE URINE RATIO
Creatinine, Urine: 82.4 mg/dL
Microalb Creat Ratio: 9512 mg/g{creat} — ABNORMAL HIGH (ref 0–29)
Microalb, Ur: 7838.2 ug/mL — ABNORMAL HIGH

## 2024-05-19 LAB — COMPREHENSIVE METABOLIC PANEL WITH GFR
ALT: 13 U/L (ref 0–44)
AST: 25 U/L (ref 15–41)
Albumin: 1.9 g/dL — ABNORMAL LOW (ref 3.5–5.0)
Alkaline Phosphatase: 56 U/L (ref 38–126)
Anion gap: 3 — ABNORMAL LOW (ref 5–15)
BUN: 29 mg/dL — ABNORMAL HIGH (ref 8–23)
CO2: 32 mmol/L (ref 22–32)
Calcium: 8.6 mg/dL — ABNORMAL LOW (ref 8.9–10.3)
Chloride: 101 mmol/L (ref 98–111)
Creatinine, Ser: 1.22 mg/dL — ABNORMAL HIGH (ref 0.44–1.00)
GFR, Estimated: 43 mL/min — ABNORMAL LOW (ref >60)
Glucose, Bld: 110 mg/dL — ABNORMAL HIGH (ref 70–99)
Potassium: 3.7 mmol/L (ref 3.5–5.1)
Sodium: 136 mmol/L (ref 135–145)
Total Bilirubin: 0.5 mg/dL (ref 0.0–1.2)
Total Protein: 4.7 g/dL — ABNORMAL LOW (ref 6.5–8.1)

## 2024-05-19 MED ORDER — BUMETANIDE 2 MG PO TABS
2.0000 mg | ORAL_TABLET | Freq: Every day | ORAL | Status: DC
  Administered 2024-05-19 – 2024-05-24 (×6): 2 mg via ORAL
  Filled 2024-05-19 (×6): qty 1

## 2024-05-19 MED ORDER — ALBUTEROL SULFATE (2.5 MG/3ML) 0.083% IN NEBU
2.5000 mg | INHALATION_SOLUTION | RESPIRATORY_TRACT | Status: DC | PRN
  Administered 2024-05-19 – 2024-05-24 (×15): 2.5 mg via RESPIRATORY_TRACT
  Filled 2024-05-19 (×15): qty 3

## 2024-05-19 MED ORDER — PREGABALIN 100 MG PO CAPS: 200.0000 mg | ORAL_CAPSULE | Freq: Every day | ORAL | Status: DC

## 2024-05-19 MED ORDER — PREGABALIN 100 MG PO CAPS
100.0000 mg | ORAL_CAPSULE | Freq: Every day | ORAL | Status: DC
  Administered 2024-05-20 – 2024-05-23 (×4): 100 mg via ORAL
  Filled 2024-05-19 (×4): qty 1

## 2024-05-19 NOTE — Evaluation (Signed)
 Occupational Therapy Evaluation & Discharge  Patient Details Name: Catherine [name] MRN: [id number] DOB: 04-Dec-1934 Today's Date: 05/19/2024   History of Present Illness   89 y.o. female adm 05/14/24 for LB edema, dyspnea, CHF exacerbation. PMH: Afib on Eliquis , PPM, COPD, MVR, CAD s/p CABG, HTN, HLD, HFpEF, chronic back pain with past surgeries, CKD     Clinical Impressions Pt admitted based on above, and was seen based on problem list below. Therapy consult re-ordered 8/23, after d/c from OT/PT. Pt reporting no change in functional status since initial eval and d/c. Pt at functional baseline of mod I for ADLs and mobility. Able to complete LB dressing, standing ADLs, and mobilize in hallway no AD. Pt reporting mild SOB but O2 sats and HR stable on RA. Pt reporting no concerns regarding self care or mobility on d/c. Pt requesting HH RN and meals on wheels assistance, NCM notified. No DME or follow up OT needs. OT is signing off on this pt.        If plan is discharge home, recommend the following:   Assistance with cooking/housework     Functional Status Assessment   Patient has not had a recent decline in their functional status     Equipment Recommendations   None recommended by OT      Precautions/Restrictions   Precautions Precautions: Fall Recall of Precautions/Restrictions: Intact Restrictions Weight Bearing Restrictions Per Provider Order: No     Mobility Bed Mobility Overal bed mobility: Modified Independent     General bed mobility comments: Received in recliner    Transfers Overall transfer level: Modified independent       General transfer comment: No AD ~121ft in hallway      Balance Overall balance assessment: Mild deficits observed, not formally tested           ADL either performed or assessed with clinical judgement   ADL Overall ADL's : Modified independent;At baseline       General ADL Comments: Mod I able to complete  standing ADLs and LB ADLs     Vision Baseline Vision/History: 0 No visual deficits Vision Assessment?: No apparent visual deficits            Pertinent Vitals/Pain Pain Assessment Pain Assessment: No/denies pain     Extremity/Trunk Assessment Upper Extremity Assessment Upper Extremity Assessment: Overall WFL for tasks assessed   Lower Extremity Assessment Lower Extremity Assessment: Defer to PT evaluation   Cervical / Trunk Assessment Cervical / Trunk Assessment: Kyphotic;Other exceptions   Communication Communication Communication: No apparent difficulties   Cognition Arousal: Alert Behavior During Therapy: WFL for tasks assessed/performed Cognition: No apparent impairments         Following commands: Intact       Cueing  General Comments   Cueing Techniques: Verbal cues  VSS on RA           Home Living Family/patient expects to be discharged to:: Private residence Living Arrangements: Spouse/significant other Available Help at Discharge: Family;Available 24 hours/day Type of Home: House Home Access: Level entry     Home Layout: One level     Bathroom Shower/Tub: Producer, television/film/video: Standard Bathroom Accessibility: Yes How Accessible: Accessible via walker Home Equipment: Rolling Walker (2 wheels);Cane - single point;Shower seat          Prior Functioning/Environment Prior Level of Function : Independent/Modified Independent;Driving             Mobility Comments: No DME, no falls ADLs Comments:  independent    OT Problem List: Cardiopulmonary status limiting activity;Decreased activity tolerance        OT Goals(Current goals can be found in the care plan section)   Acute Rehab OT Goals Patient Stated Goal: To not get SOB OT Goal Formulation: All assessment and education complete, DC therapy Time For Goal Achievement: 05/02/24 Potential to Achieve Goals: Good   AM-PAC OT 6 Clicks Daily Activity     Outcome  Measure Help from another person eating meals?: None Help from another person taking care of personal grooming?: None Help from another person toileting, which includes using toliet, bedpan, or urinal?: None Help from another person bathing (including washing, rinsing, drying)?: None Help from another person to put on and taking off regular upper body clothing?: None Help from another person to put on and taking off regular lower body clothing?: None 6 Click Score: 24   End of Session Nurse Communication: Mobility status  Activity Tolerance: Patient tolerated treatment well Patient left: in chair;with call bell/phone within reach;with nursing/sitter in room;with family/visitor present  OT Visit Diagnosis: Muscle weakness (generalized) (M62.81)                Time: 8375-8351 OT Time Calculation (min): 24 min Charges:  OT General Charges $OT Visit: 1 Visit  Adrianne BROCKS, OT  Acute Rehabilitation Services Office [phone] Secure chat preferred   Adrianne GORMAN Savers 05/19/2024, 4:52 PM

## 2024-05-19 NOTE — Progress Notes (Signed)
 Progress Note  Patient Name: JAXIE RACANELLI Date of Encounter: 05/19/2024 [facility] Cardiologist: Annabella Scarce, MD   Interval Summary   Appreciate nephrology and oncology recommendations - concern for possibly amyloid or myeloma. Work-up as outpatient recommended. S/p RHC which showed mild volume overload, plans to transition to oral diuretics today. About net event overnight, overall 4L Negative.  Vital Signs Vitals:   05/18/24 2057 05/19/24 0022 05/19/24 0555 05/19/24 0805  BP: (!) 114/98 (!) 117/53 (!) 130/91 115/60  Pulse: 62 60 60 73  Resp:  16 20 13   Temp: (!) 97.3 F (36.3 C) 97.9 F (36.6 C) 98 F (36.7 C) (!) 97.5 F (36.4 C)  TempSrc: Oral Oral Oral Oral  SpO2:  98% 100% 92%  Weight:   56.5 kg   Height:        Intake/Output Summary (Last 24 hours) at 05/19/2024 1000 Last data filed at 05/19/2024 0100 Gross per 24 hour  Intake 120 ml  Output 100 ml  Net 20 ml      05/19/2024    5:55 AM 05/18/2024    6:27 AM 05/17/2024    4:43 AM  Last 3 Weights  Weight (lbs) 124 lb 8 oz 119 lb 8 oz 125 lb 6.4 oz  Weight (kg) 56.473 kg 54.205 kg 56.881 kg     Telemetry/ECG  AV paced, HR 60s - Personally Reviewed  Physical Exam  GEN: No acute distress.   Neck: No JVD Cardiac: RRR, no murmurs, rubs, or gallops.  Respiratory: Clear to auscultation bilaterally. GI: Soft, nontender, non-distended  MS: 1-2+ LE edema  Assessment & Plan  Acute on chronic HFpEF, NYHA IV  Hypertension  Presented with worsening LE edema, shortness of breath, cough x 1 week  CXR showed bilateral pleural effusion, pulmonary vascular congestion  Echo 03/2024 showed EF 70-75% BNP 15,910 Net - 4 L this admission Creatinine trending down this AM, 1.26 ? 1.20 Most recent BP 103/54, most BP low-normal Weight 125 lb, down from 127 lb (120 lb when recently discharged)  She reports no significant increase in urine output and says she feels her shortness of breath is the same/possibly a  bit worse today  Currently on IV Lasix  80 mg BID -> will switch to oral bumex  2 mg daily today. Continue Jardiance  10 mg  Renal function limited GDMT, intolerant to beta-blockers  Currently requiring midodrine  10 mg TID  Continue strict I&O's, daily weights, daily BMPs   CAD s/p CABG x 1, 2018 Troponin level 159 ? 158 ? 181 ? 187   No active chest pain  Suspect secondary to demand ischemia  Not on ASA due to Eliquis  use  Intolerant to beta-blockers, statins  No plans for ischemic evaluation at this time    Sick sinus syndrome  Junctional tachycardia  S/p PPM Paroxysmal atrial fibrillation SVT Remains rate controlled  Planned SVT ablation in September  Pacemaker functioning appropriately  No current chest pain  Intolerant to beta-blockers  Plan to proceed with SVT ablation in September  Continue Eliquis  2.5 mg BID    Hyperlipidemia  04/09/2024: HDL 42; LDL Chol Calc (NIH) 127 05/15/2024: ALT 14  Intolerant to statins  LDL uncontrolled on Leqvio    S/p bioprostehtic MVR, 2018 Stable per echo 03/2024     Per primary  Nephrotic syndrome  Hypoalbuminemia Recent weight loss, decreased appetite  CKD 3a COPD Pleural effusions Electrolyte disturbances  [facility] will sign off.   Medication Recommendations:  none Other recommendations (labs, testing,  etc):  none Follow up as an outpatient:  Dr. Raford (06/04/2024)  For questions or updates, please contact [facility] Please consult [email or web address] for contact info under   Vinie KYM Maxcy, MD, [facility], FNLA, FACP  [place]  [facility]  Medical Director of the [facility]  [facility] Diplomate of the American Board of Clinical Lipidology Attending Cardiologist  Direct Dial: [phone]  Fax: [phone]  Website:  [email or web address]  Vinie JAYSON Maxcy, MD

## 2024-05-19 NOTE — Plan of Care (Signed)

## 2024-05-19 NOTE — Progress Notes (Signed)
 PT Cancellation Note  Patient Details Name: Catherine [name] MRN: [id number] DOB: 1935-05-02   Cancelled Treatment:    Reason Eval/Treat Not Completed: PT screened, no needs identified, will sign off (Pt reports being at baseline for mobility with no need for further PT. Acute PT signing off. Please re-consult if there are any changes in mobility status.)  [name] ORN, PT, DPT Secure Chat Preferred  Rehab Office [phone]   [name] BRAVO Wendolyn 05/19/2024, 5:11 PM

## 2024-05-19 NOTE — Consult Note (Signed)
 Chief Complaint: Monoclonal gammopathy of unknown significance, concern for myeloma - IR consulted for image guided bone marrow biopsy  Referring Provider(s): Ennever, Maude SAUNDERS, MD   Supervising Physician: Johann Sieving  Patient Status: [facility] - In-pt  History of Present Illness: Catherine [name] is a 89 y.o. female with pmhx of CAD s/p CABG, s/p MVR, afib, HTN, HLD, CKD 3a. Currently admitted since 05/14/24 worsening leg edema.  Patient was previously admitted in July and found to have abnormal proteins including M spike on SPEP and UPEP.  Reports having lost 35 pounds since last December.  Nephrology and oncology have been consulted due to these previous findings and current patient presentation, concern for amyloidosis and/or multiple myeloma.  IR now consulted for image guided bone marrow biopsy and aspiration for further diagnostic evaluation.  Today patient reports feeling generally fatigued, no other complaint.  IR planning to tentatively proceed with bone marrow biopsy on Monday, 05/21/2024.  Patient to be n.p.o. starting midnight of 8/25.  DNR/DNI: If pulseless and not breathing No CPR or chest compressions.  In Pre-Arrest Conditions (Patient Is Breathing and Has A Pulse) Do not intubate. Provide all appropriate non-invasive medical interventions. Avoid ICU transfer unless indicated or required    Patient currently has DNR order in place. Discussion with the patient and family regarding wishes.  The original DNR order is maintained during the procedure and prior treatment limitations are upheld during the procedure.   Past Medical History:  Diagnosis Date   AKI (acute kidney injury) (HCC) 06/22/2022   Aortic stenosis, mild 07/26/2017   Arthritis    Asthma    last attack 02/2015   Cellulitis of left lower extremity 08/17/2016   Ulcer associated with severe venous insufficiency   Chronic anticoagulation    Chronic diastolic CHF (congestive heart failure) (HCC)    Chronic  kidney disease    RIGHT MANY KIDNEY INFECTIONS AND STONES   COPD (chronic obstructive pulmonary disease) (HCC)    Coronary artery disease    Dizziness    H/O: rheumatic fever    Hypertension    PAF (paroxysmal atrial fibrillation) (HCC)    PONV (postoperative nausea and vomiting)    ' SOMETIMES', BUT NOT ALWAYS   Primary hypertension    RHEUMATIC MITRAL STENOSIS 01/05/2008   Qualifier: Diagnosis of   By: Neysa MD, Clinton D        S/P Maze operation for atrial fibrillation 10/14/2016   Complete bilateral atrial lesion set using cryothermy and bipolar radiofrequency ablation - atrial appendage was not treated due to previous surgical procedure (open mitral commissurotomy)   S/P mitral valve replacement with bioprosthetic valve 10/14/2016   29 mm Medtronic Mosaic porcine bioprosthetic tissue valve   Sciatica 08/29/2023   Tricuspid regurgitation 07/14/2021   UTI (urinary tract infection)    Valvular heart disease    Has mitral stenosis with prior mitral commissurotomy in 1970    Past Surgical History:  Procedure Laterality Date   ABDOMINAL HYSTERECTOMY  1983   endometriosis   APPENDECTOMY     BACK SURGERY     neurosurgery x2   CARDIAC CATHETERIZATION     CARDIAC CATHETERIZATION N/A 08/03/2016   Procedure: Right/Left Heart Cath and Coronary Angiography;  Surgeon: Peter M Swaziland, MD;  Location: [facility] INVASIVE CV LAB;  Service: Cardiovascular;  Laterality: N/A;   cataract surg     CHOLECYSTECTOMY N/A 05/30/2015   Procedure: LAPAROSCOPIC CHOLECYSTECTOMY WITH INTRAOPERATIVE CHOLANGIOGRAM;  Surgeon: Morene Olives, MD;  Location: [facility] OR;  Service: General;  Laterality: N/A;   COLONOSCOPY     CORONARY ARTERY BYPASS GRAFT N/A 10/14/2016   Procedure: CORONARY ARTERY BYPASS GRAFTING (CABG);  Surgeon: Sudie VEAR Laine, MD;  Location: [facility] OR;  Service: Open Heart Surgery;  Laterality: N/A;   EYE SURGERY     IR THORACENTESIS ASP PLEURAL SPACE W/IMG GUIDE  04/24/2024   MAZE N/A 10/14/2016    Procedure: MAZE;  Surgeon: Sudie VEAR Laine, MD;  Location: [facility] OR;  Service: Open Heart Surgery;  Laterality: N/A;   MITRAL VALVE REPLACEMENT N/A 10/14/2016   Procedure: REDO MITRAL VALVE REPLACEMENT (MVR);  Surgeon: Sudie VEAR Laine, MD;  Location: [facility] OR;  Service: Open Heart Surgery;  Laterality: N/A;   MITRAL VALVE SURGERY Left 1970   Open mitral commissurotomy via left thoracotomy approach   PACEMAKER IMPLANT N/A 09/22/2023   Procedure: PACEMAKER IMPLANT;  Surgeon: Kennyth Chew, MD;  Location: [facility] INVASIVE CV LAB;  Service: Cardiovascular;  Laterality: N/A;   RIGHT HEART CATH N/A 05/17/2024   Procedure: RIGHT HEART CATH;  Surgeon: Darron Deatrice LABOR, MD;  Location: [facility] INVASIVE CV LAB;  Service: Cardiovascular;  Laterality: N/A;   TEE WITHOUT CARDIOVERSION N/A 07/05/2016   Procedure: TRANSESOPHAGEAL ECHOCARDIOGRAM (TEE);  Surgeon: Annabella Scarce, MD;  Location: [facility] ENDOSCOPY;  Service: Cardiovascular;  Laterality: N/A;   TEE WITHOUT CARDIOVERSION N/A 10/14/2016   Procedure: TRANSESOPHAGEAL ECHOCARDIOGRAM (TEE);  Surgeon: Sudie VEAR Laine, MD;  Location: [facility] OR;  Service: Open Heart Surgery;  Laterality: N/A;    Allergies: Aldactone  [spironolactone ], Amoxil  [amoxicillin ], Zebeta  [bisoprolol ], Calan  [verapamil ], Crestor  [rosuvastatin ], Flovent  [fluticasone  propionate], Livalo  [pitavastatin ], Lyrica  [pregabalin ], Neurontin [gabapentin], Norvasc  [amlodipine ], Zetia  [ezetimibe ], Zocor  [simvastatin ], Lotensin [benazepril], and Polytrim  [polymyxin b -trimethoprim ]  Medications: Prior to Admission medications   Medication Sig Start Date End Date Taking? Authorizing Provider  acetaminophen  (TYLENOL ) 500 MG tablet Take 500 mg by mouth every 6 (six) hours as needed for mild pain (pain score 1-3).   Yes [provider]  albuterol  (PROVENTIL ) (2.5 MG/3ML) 0.083% nebulizer solution Take 3 mLs (2.5 mg total) by nebulization every 6 (six) hours as needed (during COPD Exacerbation). 02/15/24 02/14/25 Yes  Hope Almarie ORN, NP  albuterol  (VENTOLIN  HFA) 108 (90 Base) MCG/ACT inhaler INHALE 2 PUFFS INTO THE LUNGS EVERY 4 HOURS AS NEEDED FOR AHEEZING OR SHORTNESS OF BREATH 02/21/24  Yes Parrett, Tammy S, NP  apixaban  (ELIQUIS ) 2.5 MG TABS tablet TAKE ONE TABLET TWICE DAILY (START 09/18/23 OR AS DIRECTED) Patient taking differently: Take 2.5 mg by mouth 2 (two) times daily. 10/24/23  Yes Burchette, Wolm ORN, MD  bumetanide  (BUMEX ) 1 MG tablet Take 1 tablet (1 mg total) by mouth daily as needed (for worsening lower extremity swelling, weight gain 2 to 3 lbs in 24 hrs or 5 lbs in 7 days). 05/09/24  Yes Swinyer, Rosaline HERO, NP  cholecalciferol (VITAMIN D3) 25 MCG (1000 UNIT) tablet Take 1,000 Units by mouth in the morning.   Yes [provider]  diazepam  (VALIUM ) 5 MG tablet TAKE ONE TABLET BY MOUTH AT BEDTIME 12/12/23  Yes Burchette, Wolm ORN, MD  empagliflozin  (JARDIANCE ) 10 MG TABS tablet Take 1 tablet (10 mg total) by mouth daily. Patient taking differently: Take 10 mg by mouth in the morning. 05/09/24  Yes Swinyer, Rosaline HERO, NP  estradiol  (ESTRACE ) 0.1 MG/GM vaginal cream Place 1 Applicatorful vaginally See admin instructions. Apply a small amount vaginally three nights a week on Monday, Wednesday and Friday.   Yes [provider]  feeding supplement (ENSURE PLUS HIGH  PROTEIN) LIQD Take 237 mLs by mouth 2 (two) times daily between meals. 04/25/24  Yes Arrien, Mauricio Daniel, MD  guaiFENesin  (MUCINEX ) 600 MG 12 hr tablet Take 1 tablet (600 mg total) by mouth 2 (two) times daily as needed for to loosen phlegm or cough. 04/25/24  Yes Arrien, Mauricio Daniel, MD  HYDROcodone -acetaminophen  (NORCO/VICODIN) 5-325 MG tablet TAKE ONE TABLET EVERY 8 HOURS AS NEEDED FOR PAIN 05/12/24  Yes Burchette, Wolm ORN, MD  midodrine  (PROAMATINE ) 5 MG tablet Take 1 tablet (5 mg total) by mouth 2 (two) times daily with a meal. 05/09/24  Yes Swinyer, Rosaline HERO, NP  Multiple Vitamins-Minerals (CERTAVITE/ANTIOXIDANTS)  TABS Take 1 tablet by mouth daily.   Yes [provider]  ondansetron  (ZOFRAN ) 4 MG tablet Take 1 tablet (4 mg total) by mouth every 8 (eight) hours as needed for nausea or vomiting. 04/25/24  Yes Arrien, Mauricio Daniel, MD  sodium chloride  (OCEAN) 0.65 % SOLN nasal spray Place 1 spray into both nostrils as needed for congestion.   Yes [provider]     Family History  Problem Relation Age of Onset   Leukemia Father    Breast cancer Neg Hx     Social History   Socioeconomic History   Marital status: Married    Spouse name: Not on file   Number of children: Not on file   Years of education: Not on file   Highest education level: Not on file  Occupational History   Not on file  Tobacco Use   Smoking status: Former    Current packs/day: 0.00    Average packs/day: 1 pack/day for 15.0 years (15.0 ttl pk-yrs)    Types: Cigarettes    Start date: 09/27/1960    Quit date: 09/28/1975    Years since quitting: 48.6   Smokeless tobacco: Never  Vaping Use   Vaping status: Never Used  Substance and Sexual Activity   Alcohol use: No    Alcohol/week: 0.0 standard drinks of alcohol   Drug use: No   Sexual activity: Yes  Other Topics Concern   Not on file  Social History Narrative   Not on file   Social Drivers of Health   Financial Resource Strain: Low Risk  (04/09/2022)   Overall Financial Resource Strain (CARDIA)    Difficulty of Paying Living Expenses: Not hard at all  Food Insecurity: No Food Insecurity (05/14/2024)   Hunger Vital Sign    Worried About Running Out of Food in the Last Year: Never true    Ran Out of Food in the Last Year: Never true  Transportation Needs: No Transportation Needs (05/14/2024)   PRAPARE - Administrator, Civil Service (Medical): No    Lack of Transportation (Non-Medical): No  Physical Activity: Insufficiently Active (04/09/2022)   Exercise Vital Sign    Days of Exercise per Week: 5 days    Minutes of Exercise per  Session: 20 min  Stress: No Stress Concern Present (04/09/2022)   [employer] of Occupational Health - Occupational Stress Questionnaire    Feeling of Stress : Not at all  Social Connections: Socially Integrated (05/14/2024)   Social Connection and Isolation Panel    Frequency of Communication with Friends and Family: More than three times a week    Frequency of Social Gatherings with Friends and Family: More than three times a week    Attends Religious Services: More than 4 times per year    Active Member of [employer] or Organizations: Yes  Attends Banker Meetings: More than 4 times per year    Marital Status: Married     Review of Systems: A 12 point ROS discussed and pertinent positives are indicated in the HPI above.  All other systems are negative.  Review of Systems  Constitutional:  Positive for fatigue.    Vital Signs: BP 115/60 (BP Location: Right Arm)   Pulse 73   Temp (!) 97.5 F (36.4 C) (Oral)   Resp 13   Ht 5' 3 (1.6 m)   Wt 124 lb 8 oz (56.5 kg)   SpO2 92%   BMI 22.05 kg/m   Advance Care Plan: The advanced care place/surrogate decision maker was discussed at the time of visit and the patient did not wish to discuss or was not able to name a surrogate decision maker or provide an advance care plan.  Physical Exam Vitals and nursing note reviewed.  Constitutional:      Comments: Thin appearing  HENT:     Mouth/Throat:     Mouth: Mucous membranes are moist.     Pharynx: Oropharynx is clear.  Cardiovascular:     Rate and Rhythm: Normal rate.  Pulmonary:     Effort: Pulmonary effort is normal.     Breath sounds: Normal breath sounds.  Abdominal:     Palpations: Abdomen is soft.     Tenderness: There is no abdominal tenderness.  Musculoskeletal:     Right lower leg: No edema.     Left lower leg: No edema.  Skin:    General: Skin is warm and dry.  Neurological:     Mental Status: She is alert and oriented to person, place, and time.  Mental status is at baseline.     Imaging: DG CHEST PORT 1 VIEW Result Date: 05/18/2024 CLINICAL DATA:  Pleural effusion.  CHF. EXAM: PORTABLE CHEST 1 VIEW COMPARISON:  Chest radiograph 05/14/2024 FINDINGS: Left-sided pacemaker remains in place. Cardiomegaly is stable. Prior median sternotomy. Aortic atherosclerosis. Right pleural effusion without significant interval change. There is a small left pleural effusion that has increased. Pulmonary edema is similar. No pneumothorax. IMPRESSION: 1. Right pleural effusion without significant interval change. 2. Small left pleural effusion has increased. 3. Pulmonary edema is similar. Electronically Signed   By: Andrea Gasman M.D.   On: 05/18/2024 15:09   CARDIAC CATHETERIZATION Result Date: 05/17/2024 Right heart catheterization via the right brachial vein showed mildly elevated filling pressures, moderate pulmonary hypertension and normal cardiac output. RA: 8 mmHg, RV 51/2 mmHg, PW: 18 mmHg, PA: 50/10 with a mean of 28 mmHg, cardiac output is 4.64 with an index of 2.92. Recommendations: She can likely be switched to an oral diuretic tomorrow.  DG Chest 2 View Result Date: 05/14/2024 CLINICAL DATA:  Shortness of breath. Cough. Bilateral leg swelling. EXAM: CHEST - 2 VIEW COMPARISON:  04/24/2024 and CT chest 09/10/2023. FINDINGS: Heart is enlarged, as before. Thoracic aorta is calcified. Pacemaker lead tips are in the right atrium and right ventricle. Mild interstitial prominence and indistinctness with a moderate right pleural effusion and small left pleural effusion. IMPRESSION: Congestive heart failure. Electronically Signed   By: Newell Eke M.D.   On: 05/14/2024 08:18   ECHOCARDIOGRAM COMPLETE Result Date: 04/24/2024    ECHOCARDIOGRAM REPORT   Patient Name:   Catherine [name] Raymond Date of Exam: 04/24/2024 Medical Rec #:  [id number]      Height:       63.0 in Accession #:    [id number]  Weight:       115.0 lb Date of Birth:  [birth year]     BSA:           1.528 m Patient Age:    88 years       BP:           139/64 mmHg Patient Gender: F              HR:           61 bpm. Exam Location:  Inpatient Procedure: 2D Echo, Cardiac Doppler and Color Doppler (Both Spectral and Color            Flow Doppler were utilized during procedure). Indications:    CHF-Acute Diastolic I50.31  History:        Patient has prior history of Echocardiogram examinations, most                 recent 11/02/2023. CHF, CAD, Prior CABG, COPD and CKD, stage 3,                 Arrythmias:Atrial Fibrillation, Signs/Symptoms:Dyspnea; Risk                 Factors:Hypertension and Dyslipidemia.                  Mitral Valve: 29 mm Medtronic bioprosthetic valve valve is                 present in the mitral position. Procedure Date: 10/14/2016.  Sonographer:    Thea Norlander RCS Referring Phys: [id number] PAULA V ROSS IMPRESSIONS  1. No resting LVOT gradient. Left ventricular ejection fraction, by estimation, is 70 to 75%. The left ventricle has hyperdynamic function. The left ventricle has no regional wall motion abnormalities. Left ventricular diastolic parameters are indeterminate.  2. Right ventricular systolic function is mildly reduced. The right ventricular size is normal. There is moderately elevated pulmonary artery systolic pressure. The estimated right ventricular systolic pressure is 50.5 mmHg.  3. Left atrial size was severely dilated.  4. Right atrial size was mildly dilated.  5. Small, echodensity noted on the chordal apparatus unchanged from 2023 PLAX view. There new LVOT is small related to valve struts with LVOT flow acceleration. The mitral valve has been repaired/replaced. No evidence of mitral valve regurgitation. The mean mitral valve gradient is 8.0 mmHg. There is a 29 mm Medtronic bioprosthetic valve present in the mitral position. Procedure Date: 10/14/2016.  6. Tricuspid valve regurgitation is moderate.  7. The aortic valve is tricuspid. Aortic valve regurgitation is not visualized.  Mild to moderate aortic valve stenosis. Aortic valve area, by VTI measures 1.04 cm. Aortic valve mean gradient measures 19.0 mmHg. Aortic valve Vmax measures 3.02 m/s.  8. The inferior vena cava is dilated in size with <50% respiratory variability, suggesting right atrial pressure of 15 mmHg. Comparison(s): Prior images reviewed side by side. Mitral gradients have increased from 2023 with similar heart rates (60s); aortic valve gradients have increased; a portion of this is related to dynamic LV function. FINDINGS  Left Ventricle: No resting LVOT gradient. Left ventricular ejection fraction, by estimation, is 70 to 75%. The left ventricle has hyperdynamic function. The left ventricle has no regional wall motion abnormalities. The left ventricular internal cavity size was normal in size. There is no left ventricular hypertrophy. Left ventricular diastolic parameters are indeterminate. Right Ventricle: The right ventricular size is normal. No increase in right ventricular wall thickness. Right ventricular systolic function is mildly  reduced. There is moderately elevated pulmonary artery systolic pressure. The tricuspid regurgitant velocity is 2.98 m/s, and with an assumed right atrial pressure of 15 mmHg, the estimated right ventricular systolic pressure is 50.5 mmHg. Left Atrium: Left atrial size was severely dilated. Right Atrium: Right atrial size was mildly dilated. Pericardium: There is no evidence of pericardial effusion. Mitral Valve: Small, echodensity noted on the chordal apparatus unchanged from 2023 PLAX view. There new LVOT is small related to valve struts with LVOT flow acceleration. The mitral valve has been repaired/replaced. No evidence of mitral valve regurgitation. There is a 29 mm Medtronic bioprosthetic valve present in the mitral position. Procedure Date: 10/14/2016. MV peak gradient, 23.7 mmHg. The mean mitral valve gradient is 8.0 mmHg with average heart rate of 60 bpm. Tricuspid Valve: The  tricuspid valve is normal in structure. Tricuspid valve regurgitation is moderate . No evidence of tricuspid stenosis. Aortic Valve: The aortic valve is tricuspid. Aortic valve regurgitation is not visualized. Mild to moderate aortic stenosis is present. Aortic valve mean gradient measures 19.0 mmHg. Aortic valve peak gradient measures 36.5 mmHg. Aortic valve area, by VTI measures 1.04 cm. Pulmonic Valve: The pulmonic valve was normal in structure. Pulmonic valve regurgitation is trivial. No evidence of pulmonic stenosis. Aorta: The aortic root and ascending aorta are structurally normal, with no evidence of dilitation. Venous: The inferior vena cava is dilated in size with less than 50% respiratory variability, suggesting right atrial pressure of 15 mmHg. IAS/Shunts: The atrial septum is grossly normal.  LEFT VENTRICLE PLAX 2D LVIDd:         4.57 cm   Diastology LVIDs:         3.08 cm   LV e' medial:    6.16 cm/s LV PW:         0.64 cm   LV E/e' medial:  32.3 LV IVS:        0.93 cm   LV e' lateral:   8.92 cm/s LVOT diam:     1.60 cm   LV E/e' lateral: 22.3 LV SV:         63 LV SV Index:   41 LVOT Area:     2.01 cm  RIGHT VENTRICLE            IVC RV S prime:     7.22 cm/s  IVC diam: 2.27 cm TAPSE (M-mode): 1.6 cm LEFT ATRIUM              Index        RIGHT ATRIUM           Index LA diam:        5.80 cm  3.79 cm/m   RA Area:     21.15 cm LA Vol (A2C):   97.8 ml  63.99 ml/m  RA Volume:   59.55 ml  38.96 ml/m LA Vol (A4C):   119.0 ml 77.86 ml/m LA Biplane Vol: 108.0 ml 70.66 ml/m  AORTIC VALVE AV Area (Vmax):    1.09 cm AV Area (Vmean):   1.05 cm AV Area (VTI):     1.04 cm AV Vmax:           302.00 cm/s AV Vmean:          198.000 cm/s AV VTI:            0.604 m AV Peak Grad:      36.5 mmHg AV Mean Grad:      19.0 mmHg LVOT Vmax:  164.00 cm/s LVOT Vmean:        103.000 cm/s LVOT VTI:          0.313 m LVOT/AV VTI ratio: 0.52  AORTA Ao Root diam: 3.15 cm Ao Asc diam:  3.50 cm MITRAL VALVE                 TRICUSPID VALVE MV Area (PHT): 2.34 cm     TR Peak grad:   35.5 mmHg MV Area VTI:   0.94 cm     TR Vmax:        298.00 cm/s MV Peak grad:  23.7 mmHg MV Mean grad:  8.0 mmHg     SHUNTS MV Vmax:       2.44 m/s     Systemic VTI:  0.31 m MV Vmean:      110.0 cm/s   Systemic Diam: 1.60 cm MV Decel Time: 324 msec MV E velocity: 199.00 cm/s Stanly Leavens MD Electronically signed by Stanly Leavens MD Signature Date/Time: 04/24/2024/5:19:23 PM    Final    IR THORACENTESIS ASP PLEURAL SPACE W/IMG GUIDE Result Date: 04/24/2024 INDICATION: In the hospital with CHF exacerbation. Right pleural effusion. IR consulted for diagnostic and therapeutic right thoracentesis. EXAM: ULTRASOUND GUIDED DIAGNOSTIC AND THERAPEUTIC RIGHT THORACENTESIS MEDICATIONS: 6 mL 1% lidocaine  COMPLICATIONS: None immediate. PROCEDURE: An ultrasound guided thoracentesis was thoroughly discussed with the patient and questions answered. The benefits, risks, alternatives and complications were also discussed. The patient understands and wishes to proceed with the procedure. Written consent was obtained. Ultrasound was performed to localize and mark an adequate pocket of fluid in the right chest. The area was then prepped and draped in the normal sterile fashion. 1% Lidocaine  was used for local anesthesia. Under ultrasound guidance a 6 Fr Safe-T-Centesis catheter was introduced. Thoracentesis was performed. The catheter was removed and a dressing applied. FINDINGS: A total of approximately 1.1 liters of clear yellow fluid was removed. Samples were sent to the laboratory as requested by the clinical team. IMPRESSION: Successful ultrasound guided right thoracentesis yielding 1.1 liters of pleural fluid. Performed and dictated by Kimble Clas, PA-C Electronically Signed   By: CHRISTELLA.  Shick M.D.   On: 04/24/2024 14:42   DG Chest Port 1 View Result Date: 04/24/2024 CLINICAL DATA:  Status post thoracentesis. EXAM: PORTABLE CHEST 1 VIEW COMPARISON:   Chest radiograph dated 04/24/2019 side. FINDINGS: Interval decrease in the size of right pleural effusion with improved aeration of the right lung base. No pneumothorax. Small left pleural effusion as seen on the prior radiograph. There is cardiomegaly with vascular congestion. Median sternotomy wires and left pectoral pacemaker device. No acute osseous pathology. IMPRESSION: Interval decrease in the size of right pleural effusion with improved aeration of the right lung base. No pneumothorax. Electronically Signed   By: Vanetta Chou M.D.   On: 04/24/2024 14:30   DG Chest 1 View Result Date: 04/23/2024 CLINICAL DATA:  Follow-up EXAM: CHEST  1 VIEW COMPARISON:  04/15/2024 FINDINGS: Enlarging right pleural effusion, now moderate. Stable small left pleural effusion. Bilateral lower lobe airspace opacities are noted, right greater than left, worsening bilaterally since prior study. There is cardiomegaly. Pacer remains in place, unchanged. Prior CABG. IMPRESSION: Enlarging moderate right pleural effusion. Stable small left pleural effusion. Worsening bilateral lower lobe airspace disease, right greater than left. Cardiomegaly. Electronically Signed   By: Franky Crease M.D.   On: 04/23/2024 18:51    Labs:  CBC: Recent Labs    05/14/24 0731 05/15/24 0449 05/16/24 0426  05/17/24 1408 05/19/24 0532  WBC 9.1 6.6 6.1  --  6.6  HGB 11.6* 11.5* 10.8* 10.5*  10.2* 10.9*  HCT 35.0* 34.6* 32.2* 31.0*  30.0* 33.5*  PLT 153 141* 138*  --  138*    COAGS: Recent Labs    09/07/23 0000 09/10/23 2325 09/12/23 0312 09/13/23 0323  INR 1.2* 1.5* 1.8* 2.1*    BMP: Recent Labs    05/17/24 0429 05/17/24 1408 05/18/24 0537 05/18/24 1402 05/19/24 0532  NA 140 136  137 138 146* 136  K 3.6 2.9*  2.9* 2.8* 3.5 3.7  CL 99  --  98 101 101  CO2 30  --  32 34* 32  GLUCOSE 95  --  100* 123* 110*  BUN 28*  --  28* 27* 29*  CALCIUM  8.7*  --  9.0 9.5 8.6*  CREATININE 1.20*  --  1.27* 1.20* 1.22*   GFRNONAA 44*  --  41* 44* 43*    LIVER FUNCTION TESTS: Recent Labs    04/23/24 1105 04/24/24 0431 04/25/24 0452 05/14/24 0731 05/15/24 0449 05/19/24 0532  BILITOT 0.4  --   --  0.3 0.6 0.5  AST 27  --   --  33 28 25  ALT 12  --   --  13 14 13   ALKPHOS 67  --   --  84 60 56  PROT 4.9*  --   --  5.4* 4.9* 4.7*  ALBUMIN  2.2*   < > 2.0* 2.8* 2.0* 1.9*   < > = values in this interval not displayed.    TUMOR MARKERS: No results for input(s): AFPTM, CEA, CA199, CHROMGRNA in the last 8760 hours.  Assessment and Plan:  BRIGHTEN BUZZELLI is a 89 y.o. female with pmhx of CAD s/p CABG, s/p MVR, afib, HTN, HLD, CKD 3a. Currently admitted since 05/14/24 worsening leg edema.  Patient was previously admitted in July and found to have abnormal proteins including M spike on SPEP and UPEP.  Reports having lost 35 pounds since last December.  Nephrology and oncology have been consulted due to these previous findings and current patient presentation, concern for amyloidosis and/or multiple myeloma.  IR now consulted for image guided bone marrow biopsy and aspiration for further diagnostic evaluation.  Today patient reports feeling generally fatigued, no other complaint.  IR planning to tentatively proceed with bone marrow biopsy on Monday, 05/21/2024.  Patient to be n.p.o. starting midnight of 8/25.  Thank you for allowing our service to participate in Catherine [name] 's care.  Electronically Signed: Kimble VEAR Clas, PA-C   05/19/2024, 11:00 AM      I spent a total of 20 Minutes    in face to face in clinical consultation, greater than 50% of which was counseling/coordinating care for bone marrow biopsy and aspiration.

## 2024-05-19 NOTE — Progress Notes (Addendum)
 PROGRESS NOTE    Catherine [name]  FMW:[id number] DOB: 07-Jan-1935 DOA: 05/14/2024 PCP: Micheal Wolm ORN, MD   Brief Narrative:  Catherine [name] is a 89 y.o. 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  Patient presented secondary to leg swelling and dyspnea, found to have evidence of acute heart failure/volume overload. IV diuresis started hospitalist called for admission and cardiology called in consult.  Assessment & Plan:   Principal Problem:   Pleural effusion due to CHF (congestive heart failure) (HCC) Active Problems:   Atrial fibrillation (HCC)   S/P mitral valve replacement with bioprosthetic valve - Oct 14, 2016   Acute on chronic diastolic CHF (congestive heart failure) (HCC)   Essential hypertension   CKD stage 3a, GFR 45-59 ml/min (HCC)   Coronary artery disease   COPD  GOLD 2   Protein-calorie malnutrition, severe   Rheumatic heart disease   Sick sinus syndrome (HCC)   Persistent proteinuria  Acute on chronic diastolic heart failure -Ongoing shortness of breath/dyspnea with exertion and orthopnea -Prior echo July 2025 with 70 to 75% EF with indeterminate diastolic parameters -Right heart cath without elevated right-sided pressures consistent with euvolemia - Transition back to home Bumex    Proteinuria - Unclear etiology - Elevated M spike noted -oncology consulted for further recommendations. - Currently being evaluated for amyloidosis versus monoclonal gammopathy - Increasing midodrine  as below - avoid nephrotoxins - PET scan completed 23rd, renal biopsy tentatively planned for 8/25 - Potential treatment includes daratumumab and Velcade per oncology - Bone biopsy ordered, defer to oncology  CKD stage IIIa, at baseline - Baseline creatinine 1.2-1.4, remained  stable despite increasing diuretics, now on hold as above - Nephrology to assist with further workup given proteinuria, see below  Hypokalemia -Secondary to aggressive diuresis, replete, follow repeat labs  Diffuse edema, improving Pleural effusion Initially presumed to be secondary to heart failure, given right heart cath concern for possible secondary causes and proteinuria as above   History of severe mitral valve stenosis s/p mitral valve replacement with bioprosthetic valve Noted.  Cardiology following.   Atrial fibrillation, rate controlled -Continue low-dose Eliquis  secondary to age and weight  Chronic hypotension Increase midodrine  10 mg 3 times daily given moderately depressed blood pressures overnight and this morning   Nerve pain/neuropathy - Appears to be chronic given patient reports prior sciatica pain - Overnight bilateral lower extremity paresthesias and pain - Transition Lyrica  to at bedtime only  COPD -Continue Duoneb/albuterol  CAD No chest pain. Not on aspirin , statin, or beta blocker. Severe malnutrition Noted. Rheumatic heart disease Noted. Stable. Sick sinus syndrome S/p pacemaker  DVT prophylaxis: apixaban  (ELIQUIS ) tablet 2.5 mg Start: 05/15/24 2200 Place TED hose Start: 05/14/24 2205 SCDs Start: 05/14/24 2012 apixaban  (ELIQUIS ) tablet 2.5 mg   Code Status:   Code Status: Limited: Do not attempt resuscitation (DNR) -DNR-LIMITED -Do Not Intubate/DNI   Family Communication: None present  Status is: Inpatient  Dispo: The patient is from: Home              Anticipated d/c is to: Pending clinical course              Anticipated d/c date is: To be determined likely 48 to 72 hours              Patient currently not medically stable for discharge given ongoing need for  IV diuretics close monitoring in the setting of heart failure exacerbation  Consultants:  Cardiology, nephrology, oncology  Procedures:  RHC 8/21  Antimicrobials:  None  indicated  Subjective: No acute issues or events overnight, feels improved from prior but not yet back to baseline, still feels  unwell/weak/fatigue but denies any specific complaints of chest pain nausea vomiting diarrhea constipation headache fevers or chills.  Objective: Vitals:   05/18/24 1658 05/18/24 2057 05/19/24 0022 05/19/24 0555  BP: (!) 113/53 (!) 114/98 (!) 117/53 (!) 130/91  Pulse: 61 62 60 60  Resp: 18  16 20   Temp: 97.9 F (36.6 C) (!) 97.3 F (36.3 C) 97.9 F (36.6 C) 98 F (36.7 C)  TempSrc: Oral Oral Oral Oral  SpO2: 96%  98% 100%  Weight:    56.5 kg  Height:        Intake/Output Summary (Last 24 hours) at 05/19/2024 0757 Last data filed at 05/19/2024 0100 Gross per 24 hour  Intake 480 ml  Output 300 ml  Net 180 ml   Filed Weights   05/17/24 0443 05/18/24 0627 05/19/24 0555  Weight: 56.9 kg 54.2 kg 56.5 kg    Examination:  General:  Pleasantly resting in bed, No acute distress. HEENT:  Normocephalic atraumatic.  Sclerae nonicteric, noninjected.  Extraocular movements intact bilaterally. Neck:  Without mass or deformity.  Trachea is midline. Lungs:  Clear to auscultate bilaterally without rhonchi, wheeze, or rales. Heart:  Regular rate and rhythm.  Without murmurs, rubs, or gallops. Abdomen:  Soft, nontender, nondistended.  Without guarding or rebound. Extremities: Bilateral lower extremity thigh high stockings noted  Data Reviewed: I have personally reviewed following labs and imaging studies  CBC: Recent Labs  Lab 05/14/24 0731 05/15/24 0449 05/16/24 0426 05/17/24 1408 05/19/24 0532  WBC 9.1 6.6 6.1  --  6.6  NEUTROABS 7.1  --   --   --  4.3  HGB 11.6* 11.5* 10.8* 10.5*  10.2* 10.9*  HCT 35.0* 34.6* 32.2* 31.0*  30.0* 33.5*  MCV 92.1 91.8 90.7  --  93.3  PLT 153 141* 138*  --  138*   Basic Metabolic Panel: Recent Labs  Lab 05/15/24 0449 05/16/24 0426 05/17/24 0429 05/17/24 1408 05/18/24 0537 05/18/24 1402 05/19/24 0532  NA  139 137 140 136  137 138 146* 136  K 3.7 2.7* 3.6 2.9*  2.9* 2.8* 3.5 3.7  CL 98 97* 99  --  98 101 101  CO2 31 31 30   --  32 34* 32  GLUCOSE 102* 100* 95  --  100* 123* 110*  BUN 29* 29* 28*  --  28* 27* 29*  CREATININE 1.16* 1.26* 1.20*  --  1.27* 1.20* 1.22*  CALCIUM  9.4 8.7* 8.7*  --  9.0 9.5 8.6*  MG 2.3  --   --   --   --   --   --   PHOS 4.3  --   --   --   --   --   --    GFR: Estimated Creatinine Clearance: 27.4 mL/min (A) (by C-G formula based on SCr of 1.22 mg/dL (H)). Liver Function Tests: Recent Labs  Lab 05/14/24 0731 05/15/24 0449 05/19/24 0532  AST 33 28 25  ALT 13 14 13   ALKPHOS 84 60 56  BILITOT 0.3 0.6 0.5  PROT 5.4* 4.9* 4.7*  ALBUMIN  2.8* 2.0* 1.9*   BNP (last 3 results) Recent Labs    03/24/24 2240 04/15/24 1604 05/14/24 0731  PROBNP 5,712.0* 14,671.0* 15,910.0*  Sepsis Labs: Recent Labs  Lab 05/14/24 0727 05/14/24 0943  LATICACIDVEN 1.7 1.0    Recent Results (from the past 240 hours)  Resp panel by RT-PCR (RSV, Flu A&B, Covid) Anterior Nasal Swab     Status: None   Collection Time: 05/14/24  7:31 AM   Specimen: Anterior Nasal Swab  Result Value Ref Range Status   SARS Coronavirus 2 by RT PCR NEGATIVE NEGATIVE Final    Comment: (NOTE) SARS-CoV-2 target nucleic acids are NOT DETECTED.  The SARS-CoV-2 RNA is generally detectable in upper respiratory specimens during the acute phase of infection. The lowest concentration of SARS-CoV-2 viral copies this assay can detect is 138 copies/mL. A negative result does not preclude SARS-Cov-2 infection and should not be used as the sole basis for treatment or other patient management decisions. A negative result may occur with  improper specimen collection/handling, submission of specimen other than nasopharyngeal swab, presence of viral mutation(s) within the areas targeted by this assay, and inadequate number of viral copies(<138 copies/mL). A negative result must be combined with clinical  observations, patient history, and epidemiological information. The expected result is Negative.  Fact Sheet for Patients:  BloggerCourse.com  Fact Sheet for Healthcare Providers:  SeriousBroker.it  This test is no t yet approved or cleared by the [place]  FDA and  has been authorized for detection and/or diagnosis of SARS-CoV-2 by FDA under an Emergency Use Authorization (EUA). This EUA will remain  in effect (meaning this test can be used) for the duration of the COVID-19 declaration under Section 564(b)(1) of the Act, 21 U.S.C.section 360bbb-3(b)(1), unless the authorization is terminated  or revoked sooner.       Influenza A by PCR NEGATIVE NEGATIVE Final   Influenza B by PCR NEGATIVE NEGATIVE Final    Comment: (NOTE) The Xpert Xpress SARS-CoV-2/FLU/RSV plus assay is intended as an aid in the diagnosis of influenza from Nasopharyngeal swab specimens and should not be used as a sole basis for treatment. Nasal washings and aspirates are unacceptable for Xpert Xpress SARS-CoV-2/FLU/RSV testing.  Fact Sheet for Patients: BloggerCourse.com  Fact Sheet for Healthcare Providers: SeriousBroker.it  This test is not yet approved or cleared by the [place]  FDA and has been authorized for detection and/or diagnosis of SARS-CoV-2 by FDA under an Emergency Use Authorization (EUA). This EUA will remain in effect (meaning this test can be used) for the duration of the COVID-19 declaration under Section 564(b)(1) of the Act, 21 U.S.C. section 360bbb-3(b)(1), unless the authorization is terminated or revoked.     Resp Syncytial Virus by PCR NEGATIVE NEGATIVE Final    Comment: (NOTE) Fact Sheet for Patients: BloggerCourse.com  Fact Sheet for Healthcare Providers: SeriousBroker.it  This test is not yet approved or cleared by  the [place]  FDA and has been authorized for detection and/or diagnosis of SARS-CoV-2 by FDA under an Emergency Use Authorization (EUA). This EUA will remain in effect (meaning this test can be used) for the duration of the COVID-19 declaration under Section 564(b)(1) of the Act, 21 U.S.C. section 360bbb-3(b)(1), unless the authorization is terminated or revoked.  Performed at [facility], [address] [place], [place] [address]          Radiology Studies: DG CHEST PORT 1 VIEW Result Date: 05/18/2024 CLINICAL DATA:  Pleural effusion.  CHF. EXAM: PORTABLE CHEST 1 VIEW COMPARISON:  Chest radiograph 05/14/2024 FINDINGS: Left-sided pacemaker remains in place. Cardiomegaly is stable. Prior median sternotomy. Aortic atherosclerosis. Right pleural effusion without significant interval change.  There is a small left pleural effusion that has increased. Pulmonary edema is similar. No pneumothorax. IMPRESSION: 1. Right pleural effusion without significant interval change. 2. Small left pleural effusion has increased. 3. Pulmonary edema is similar. Electronically Signed   By: Andrea Gasman M.D.   On: 05/18/2024 15:09   CARDIAC CATHETERIZATION Result Date: 05/17/2024 Right heart catheterization via the right brachial vein showed mildly elevated filling pressures, moderate pulmonary hypertension and normal cardiac output. RA: 8 mmHg, RV 51/2 mmHg, PW: 18 mmHg, PA: 50/10 with a mean of 28 mmHg, cardiac output is 4.64 with an index of 2.92. Recommendations: She can likely be switched to an oral diuretic tomorrow.       Scheduled Meds:  apixaban   2.5 mg Oral BID   diazepam   5 mg Oral QHS   docusate sodium   100 mg Oral BID   empagliflozin   10 mg Oral q AM   feeding supplement  1 Container Oral BID BM   feeding supplement  237 mL Oral BID BM   guaiFENesin   600 mg Oral BID   midodrine   10 mg Oral TID WC   multivitamin with minerals  1 tablet Oral Daily    neomycin -bacitracin -polymyxin   Topical BID   potassium chloride   40 mEq Oral Daily   pregabalin   75 mg Oral BID   senna  1 tablet Oral BID   sodium chloride  flush  3 mL Intravenous Q12H   sodium chloride  flush  3 mL Intravenous Q12H   zinc  sulfate (50mg  elemental zinc )  220 mg Oral Daily   Continuous Infusions:      LOS: 5 days   Time spent:  Elsie JAYSON Montclair, DO [facility]  If 7PM-7AM, please contact night-coverage [email or web address]  05/19/2024, 7:57 AM

## 2024-05-20 DIAGNOSIS — N1831 Chronic kidney disease, stage 3a: Secondary | ICD-10-CM | POA: Diagnosis not present

## 2024-05-20 DIAGNOSIS — I5033 Acute on chronic diastolic (congestive) heart failure: Secondary | ICD-10-CM | POA: Diagnosis not present

## 2024-05-20 DIAGNOSIS — I509 Heart failure, unspecified: Secondary | ICD-10-CM | POA: Diagnosis not present

## 2024-05-20 LAB — IGG, IGA, IGM
IgA: 225 mg/dL (ref 64–422)
IgG (Immunoglobin G), Serum: 395 mg/dL — ABNORMAL LOW (ref 586–1602)
IgM (Immunoglobulin M), Srm: 28 mg/dL (ref 26–217)

## 2024-05-20 LAB — BASIC METABOLIC PANEL WITH GFR
Anion gap: 8 (ref 5–15)
BUN: 29 mg/dL — ABNORMAL HIGH (ref 8–23)
CO2: 30 mmol/L (ref 22–32)
Calcium: 8.7 mg/dL — ABNORMAL LOW (ref 8.9–10.3)
Chloride: 98 mmol/L (ref 98–111)
Creatinine, Ser: 1.13 mg/dL — ABNORMAL HIGH (ref 0.44–1.00)
GFR, Estimated: 47 mL/min — ABNORMAL LOW (ref >60)
Glucose, Bld: 100 mg/dL — ABNORMAL HIGH (ref 70–99)
Potassium: 4 mmol/L (ref 3.5–5.1)
Sodium: 136 mmol/L (ref 135–145)

## 2024-05-20 LAB — ERYTHROPOIETIN: Erythropoietin: 12.3 m[IU]/mL (ref 2.6–18.5)

## 2024-05-20 NOTE — Progress Notes (Signed)
 PROGRESS NOTE    Catherine [name]  FMW:[id number] DOB: [birth year] DOA: 05/14/2024 PCP: Micheal Wolm ORN, MD   Brief Narrative:  Catherine [name] is a 89 y.o. 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  Patient presented secondary to leg swelling and dyspnea, found to have evidence of acute heart failure/volume overload. IV diuresis started hospitalist called for admission and cardiology called in consult.  Assessment & Plan:   Principal Problem:   Pleural effusion due to CHF (congestive heart failure) (HCC) Active Problems:   Atrial fibrillation (HCC)   S/P mitral valve replacement with bioprosthetic valve - Oct 14, 2016   Acute on chronic diastolic CHF (congestive heart failure) (HCC)   Essential hypertension   CKD stage 3a, GFR 45-59 ml/min (HCC)   Coronary artery disease   COPD  GOLD 2   Protein-calorie malnutrition, severe   Rheumatic heart disease   Sick sinus syndrome (HCC)   Persistent proteinuria  Acute on chronic diastolic heart failure, resolved -Ongoing shortness of breath/dyspnea with exertion and orthopnea -Prior echo July 2025 with 70 to 75% EF with indeterminate diastolic parameters -Right heart cath without elevated right-sided pressures consistent with euvolemia - Transition back to home Bum[facility]    Proteinuria - Unclear etiology - Elevated M spike noted -oncology consulted for further recommendations. - Currently being evaluated for amyloidosis versus monoclonal gammopathy - Increasing midodrine  as below - avoid nephrotoxins - PET scan completed 23rd - Potential treatment includes daratumumab and Velcade per oncology - Bone biopsy and renal biopsy pending -likely to be completed on 8/25 - Once biopsies have been taken patient can discharged for outpatient  follow-up with oncology clinic for ongoing treatment.  CKD stage IIIa, at baseline - Baseline creatinine 1.2-1.4, remained stable despite increasing diuretics, now on hold as above - Nephrology to assist with further workup given proteinuria, see below  Hypokalemia -Secondary to aggressive diuresis, replete, follow repeat labs  Diffuse edema, improving Pleural effusion Initially presumed to be secondary to heart failure, given right heart cath concern for possible secondary causes and proteinuria as above   History of severe mitral valve stenosis s/p mitral valve replacement with bioprosthetic valve Noted.  Cardiology following.   Atrial fibrillation, rate controlled -Continue low-dose Eliquis  secondary to age and weight  Chronic hypotension Increase midodrine  10 mg 3 times daily given moderately depressed blood pressures overnight and this morning   Nerve pain/neuropathy - Appears to be chronic given patient reports prior sciatica pain - Overnight bilateral lower extremity paresthesias and pain - Transition Lyrica  to at bedtime only  COPD -Continue Duoneb/albuterol  CAD No chest pain. Not on aspirin , statin, or beta blocker. Severe malnutrition Noted. Rheumatic heart disease Noted. Stable. Sick sinus syndrome S/p pacemaker  DVT prophylaxis: apixaban  (ELIQUIS ) tablet 2.5 mg Start: 05/15/24 2200 Place TED hose Start: 05/14/24 2205 SCDs Start: 05/14/24 2012 apixaban  (ELIQUIS ) tablet 2.5 mg   Code Status:   Code Status: Limited: Do not attempt resuscitation (DNR) -DNR-LIMITED -Do Not Intubate/DNI   Family Communication: None present  Status is: Inpatient  Dispo: The patient is from: Home              Anticipated d/c is to: Home              Anticipated d/c date is: 24h -PT OT signed off, no indication for home health therapy at this time  Consultants:  Cardiology, nephrology, oncology  Procedures:  RHC 8/21  Antimicrobials:  None indicated  Subjective: No acute  issues or events overnight, feels improved but still not great  Objective: Vitals:   05/19/24 1643 05/19/24 2235 05/20/24 0328 05/20/24 0329  BP: (!) 126/96 (!) 123/47 125/80   Pulse: 77 61 85   Resp: 16 16 18    Temp: 98.5 F (36.9 C) 98.4 F (36.9 C) 97.7 F (36.5 C)   TempSrc: Oral Oral Oral   SpO2: 96% 94% 99%   Weight:    57.1 kg  Height:        Intake/Output Summary (Last 24 hours) at 05/20/2024 0729 Last data filed at 05/20/2024 0300 Gross per 24 hour  Intake 1080 ml  Output 600 ml  Net 480 ml   Filed Weights   05/18/24 0627 05/19/24 0555 05/20/24 0329  Weight: 54.2 kg 56.5 kg 57.1 kg    Examination:  General:  Pleasantly resting in bed, No acute distress. HEENT:  Normocephalic atraumatic.  Sclerae nonicteric, noninjected.  Extraocular movements intact bilaterally. Neck:  Without mass or deformity.  Trachea is midline. Lungs:  Clear to auscultate bilaterally without rhonchi, wheeze, or rales. Heart:  Regular rate and rhythm.  Without murmurs, rubs, or gallops. Abdomen:  Soft, nontender, nondistended.  Without guarding or rebound. Extremities: Bilateral lower extremity thigh high stockings noted  Data Reviewed: I have personally reviewed following labs and imaging studies  CBC: Recent Labs  Lab 05/14/24 0731 05/15/24 0449 05/16/24 0426 05/17/24 1408 05/19/24 0532  WBC 9.1 6.6 6.1  --  6.6  NEUTROABS 7.1  --   --   --  4.3  HGB 11.6* 11.5* 10.8* 10.5*  10.2* 10.9*  HCT 35.0* 34.6* 32.2* 31.0*  30.0* 33.5*  MCV 92.1 91.8 90.7  --  93.3  PLT 153 141* 138*  --  138*   Basic Metabolic Panel: Recent Labs  Lab 05/15/24 0449 05/16/24 0426 05/17/24 0429 05/17/24 1408 05/18/24 0537 05/18/24 1402 05/19/24 0532 05/20/24 0518  NA 139   < > 140 136  137 138 146* 136 136  K 3.7   < > 3.6 2.9*  2.9* 2.8* 3.5 3.7 4.0  CL 98   < > 99  --  98 101 101 98  CO2 31   < > 30  --  32 34* 32 30  GLUCOSE 102*   < > 95  --  100* 123* 110* 100*  BUN 29*   < > 28*   --  28* 27* 29* 29*  CREATININE 1.16*   < > 1.20*  --  1.27* 1.20* 1.22* 1.13*  CALCIUM  9.4   < > 8.7*  --  9.0 9.5 8.6* 8.7*  MG 2.3  --   --   --   --   --   --   --   PHOS 4.3  --   --   --   --   --   --   --    < > = values in this interval not displayed.   GFR: Estimated Creatinine Clearance: 29.6 mL/min (A) (by C-G formula based on SCr of 1.13 mg/dL (H)). Liver Function Tests: Recent Labs  Lab 05/14/24 0731 05/15/24 0449 05/19/24 0532  AST 33 28 25  ALT 13 14 13   ALKPHOS 84 60 56  BILITOT 0.3 0.6 0.5  PROT 5.4* 4.9* 4.7*  ALBUMIN  2.8* 2.0* 1.9*   BNP (last 3 results) Recent Labs    03/24/24 2240  04/15/24 1604 05/14/24 0731  PROBNP 5,712.0* 14,671.0* 15,910.0*   Sepsis Labs: Recent Labs  Lab 05/14/24 0727 05/14/24 0943  LATICACIDVEN 1.7 1.0    Recent Results (from the past 240 hours)  Resp panel by RT-PCR (RSV, Flu A&B, Covid) Anterior Nasal Swab     Status: None   Collection Time: 05/14/24  7:31 AM   Specimen: Anterior Nasal Swab  Result Value Ref Range Status   SARS Coronavirus 2 by RT PCR NEGATIVE NEGATIVE Final    Comment: (NOTE) SARS-CoV-2 target nucleic acids are NOT DETECTED.  The SARS-CoV-2 RNA is generally detectable in upper respiratory specimens during the acute phase of infection. The lowest concentration of SARS-CoV-2 viral copies this assay can detect is 138 copies/mL. A negative result does not preclude SARS-Cov-2 infection and should not be used as the sole basis for treatment or other patient management decisions. A negative result may occur with  improper specimen collection/handling, submission of specimen other than nasopharyngeal swab, presence of viral mutation(s) within the areas targeted by this assay, and inadequate number of viral copies(<138 copies/mL). A negative result must be combined with clinical observations, patient history, and epidemiological information. The expected result is Negative.  Fact Sheet for Patients:   BloggerCourse.com  Fact Sheet for Healthcare Providers:  SeriousBroker.it  This test is no t yet approved or cleared by the [place]  FDA and  has been authorized for detection and/or diagnosis of SARS-CoV-2 by FDA under an Emergency Use Authorization (EUA). This EUA will remain  in effect (meaning this test can be used) for the duration of the COVID-19 declaration under Section 564(b)(1) of the Act, 21 U.S.C.section 360bbb-3(b)(1), unless the authorization is terminated  or revoked sooner.       Influenza A by PCR NEGATIVE NEGATIVE Final   Influenza B by PCR NEGATIVE NEGATIVE Final    Comment: (NOTE) The Xpert Xpress SARS-CoV-2/FLU/RSV plus assay is intended as an aid in the diagnosis of influenza from Nasopharyngeal swab specimens and should not be used as a sole basis for treatment. Nasal washings and aspirates are unacceptable for Xpert Xpress SARS-CoV-2/FLU/RSV testing.  Fact Sheet for Patients: BloggerCourse.com  Fact Sheet for Healthcare Providers: SeriousBroker.it  This test is not yet approved or cleared by the [place]  FDA and has been authorized for detection and/or diagnosis of SARS-CoV-2 by FDA under an Emergency Use Authorization (EUA). This EUA will remain in effect (meaning this test can be used) for the duration of the COVID-19 declaration under Section 564(b)(1) of the Act, 21 U.S.C. section 360bbb-3(b)(1), unless the authorization is terminated or revoked.     Resp Syncytial Virus by PCR NEGATIVE NEGATIVE Final    Comment: (NOTE) Fact Sheet for Patients: BloggerCourse.com  Fact Sheet for Healthcare Providers: SeriousBroker.it  This test is not yet approved or cleared by the [place]  FDA and has been authorized for detection and/or diagnosis of SARS-CoV-2 by FDA under an Emergency Use  Authorization (EUA). This EUA will remain in effect (meaning this test can be used) for the duration of the COVID-19 declaration under Section 564(b)(1) of the Act, 21 U.S.C. section 360bbb-3(b)(1), unless the authorization is terminated or revoked.  Performed at [facility], [address] [place], [place] [address]          Radiology Studies: DG Bone Survey Met Result Date: 05/19/2024 CLINICAL DATA:  Monoclonal gammopathy of unknown significance. EXAM: METASTATIC BONE SURVEY COMPARISON:  09/11/2023. FINDINGS: No lytic or destructive lesion is seen within the bones. There is  a moderate pleural effusion on the right and small pleural effusion on the left with associated atelectasis or infiltrate. The heart is enlarged and sternotomy wires are noted over the midline. There is atherosclerotic calcification of the aorta. Cervical spinal fusion hardware is noted. A multi lead pacemaker device is present over the left chest. Vascular calcifications are present in the lower extremities bilaterally. Degenerative changes are noted in the cervical, thoracic, and lumbar spine. Stable mild compression deformities are noted in the lower thoracic spine at T10 and T11. A stable compression deformity is seen in the superior endplate at L1. Lumbar spinal fusion hardware is noted at L5-S1. IMPRESSION: 1. No lytic or destructive lesion in the bones. 2. Stable compression deformities at T10, T11, and L1. 3. Moderate right pleural effusion and small left pleural effusion with atelectasis or infiltrate. Electronically Signed   By: Leita Birmingham M.D.   On: 05/19/2024 14:54   DG CHEST PORT 1 VIEW Result Date: 05/18/2024 CLINICAL DATA:  Pleural effusion.  CHF. EXAM: PORTABLE CHEST 1 VIEW COMPARISON:  Chest radiograph 05/14/2024 FINDINGS: Left-sided pacemaker remains in place. Cardiomegaly is stable. Prior median sternotomy. Aortic atherosclerosis. Right pleural effusion without significant interval  change. There is a small left pleural effusion that has increased. Pulmonary edema is similar. No pneumothorax. IMPRESSION: 1. Right pleural effusion without significant interval change. 2. Small left pleural effusion has increased. 3. Pulmonary edema is similar. Electronically Signed   By: Andrea Gasman M.D.   On: 05/18/2024 15:09        Scheduled Meds:  apixaban   2.5 mg Oral BID   bumetanide   2 mg Oral Daily   diazepam   5 mg Oral QHS   docusate sodium   100 mg Oral BID   empagliflozin   10 mg Oral q AM   feeding supplement  1 Container Oral BID BM   feeding supplement  237 mL Oral BID BM   guaiFENesin   600 mg Oral BID   midodrine   10 mg Oral TID WC   multivitamin with minerals  1 tablet Oral Daily   neomycin -bacitracin -polymyxin   Topical BID   potassium chloride   40 mEq Oral Daily   pregabalin   100 mg Oral QHS   senna  1 tablet Oral BID   sodium chloride  flush  3 mL Intravenous Q12H   sodium chloride  flush  3 mL Intravenous Q12H   zinc  sulfate (50mg  elemental zinc )  220 mg Oral Daily   Continuous Infusions:      LOS: 6 days   Time spent:  Elsie JAYSON Montclair, DO [facility]  If 7PM-7AM, please contact night-coverage [email or web address]  05/20/2024, 7:29 AM

## 2024-05-21 ENCOUNTER — Inpatient Hospital Stay (HOSPITAL_COMMUNITY)

## 2024-05-21 DIAGNOSIS — I509 Heart failure, unspecified: Secondary | ICD-10-CM | POA: Diagnosis not present

## 2024-05-21 LAB — CBC WITH DIFFERENTIAL/PLATELET
Abs Immature Granulocytes: 0.02 K/uL (ref 0.00–0.07)
Basophils Absolute: 0.1 K/uL (ref 0.0–0.1)
Basophils Relative: 1 %
Eosinophils Absolute: 0.5 K/uL (ref 0.0–0.5)
Eosinophils Relative: 7 %
HCT: 33.3 % — ABNORMAL LOW (ref 36.0–46.0)
Hemoglobin: 10.9 g/dL — ABNORMAL LOW (ref 12.0–15.0)
Immature Granulocytes: 0 %
Lymphocytes Relative: 23 %
Lymphs Abs: 1.6 K/uL (ref 0.7–4.0)
MCH: 30.9 pg (ref 26.0–34.0)
MCHC: 32.7 g/dL (ref 30.0–36.0)
MCV: 94.3 fL (ref 80.0–100.0)
Monocytes Absolute: 0.7 K/uL (ref 0.1–1.0)
Monocytes Relative: 10 %
Neutro Abs: 4.2 K/uL (ref 1.7–7.7)
Neutrophils Relative %: 59 %
Platelets: 136 K/uL — ABNORMAL LOW (ref 150–400)
RBC: 3.53 MIL/uL — ABNORMAL LOW (ref 3.87–5.11)
RDW: 13.7 % (ref 11.5–15.5)
WBC: 7.1 K/uL (ref 4.0–10.5)
nRBC: 0 % (ref 0.0–0.2)

## 2024-05-21 LAB — BASIC METABOLIC PANEL WITH GFR
Anion gap: 6 (ref 5–15)
BUN: 30 mg/dL — ABNORMAL HIGH (ref 8–23)
CO2: 29 mmol/L (ref 22–32)
Calcium: 9 mg/dL (ref 8.9–10.3)
Chloride: 101 mmol/L (ref 98–111)
Creatinine, Ser: 1.23 mg/dL — ABNORMAL HIGH (ref 0.44–1.00)
GFR, Estimated: 43 mL/min — ABNORMAL LOW (ref >60)
Glucose, Bld: 97 mg/dL (ref 70–99)
Potassium: 4.2 mmol/L (ref 3.5–5.1)
Sodium: 136 mmol/L (ref 135–145)

## 2024-05-21 MED ORDER — FENTANYL CITRATE (PF) 100 MCG/2ML IJ SOLN
INTRAMUSCULAR | Status: AC
  Filled 2024-05-21: qty 2

## 2024-05-21 MED ORDER — MIDAZOLAM HCL 2 MG/2ML IJ SOLN
INTRAMUSCULAR | Status: AC
  Filled 2024-05-21: qty 2

## 2024-05-21 MED ORDER — LIDOCAINE HCL 1 % IJ SOLN
20.0000 mL | Freq: Once | INTRAMUSCULAR | Status: AC
  Administered 2024-05-21: 10 mL

## 2024-05-21 MED ORDER — MIDAZOLAM HCL 2 MG/2ML IJ SOLN
INTRAMUSCULAR | Status: AC | PRN
Start: 2024-05-21 — End: 2024-05-21
  Administered 2024-05-21: 1 mg via INTRAVENOUS
  Administered 2024-05-21 (×2): .5 mg via INTRAVENOUS

## 2024-05-21 MED ORDER — FENTANYL CITRATE (PF) 100 MCG/2ML IJ SOLN
INTRAMUSCULAR | Status: AC | PRN
  Administered 2024-05-21 (×2): 50 ug via INTRAVENOUS

## 2024-05-21 MED ORDER — LIDOCAINE HCL 1 % IJ SOLN
INTRAMUSCULAR | Status: AC
Start: 2024-05-21 — End: 2024-05-21
  Filled 2024-05-21: qty 20

## 2024-05-21 NOTE — Plan of Care (Signed)
  Problem: Clinical Measurements: Goal: Ability to maintain clinical measurements within normal limits will improve Outcome: Progressing   Problem: Education: Goal: Knowledge of General Education information will improve Description: Including pain rating scale, medication(s)/side effects and non-pharmacologic comfort measures Outcome: Progressing   Problem: Clinical Measurements: Goal: Diagnostic test results will improve Outcome: Progressing   Problem: Clinical Measurements: Goal: Will remain free from infection Outcome: Progressing   Problem: Nutrition: Goal: Adequate nutrition will be maintained Outcome: Progressing   Problem: Coping: Goal: Level of anxiety will decrease Outcome: Progressing   Problem: Skin Integrity: Goal: Risk for impaired skin integrity will decrease Outcome: Progressing

## 2024-05-21 NOTE — Plan of Care (Signed)
   Problem: Activity: Goal: Risk for activity intolerance will decrease Outcome: Progressing   Problem: Coping: Goal: Level of anxiety will decrease Outcome: Progressing

## 2024-05-21 NOTE — Progress Notes (Signed)
 Catherine [name]   DOB:09-Jul-1935   FM#:[id number]      ASSESSMENT & PLAN:  Catherine [name] 89 y.o. female who was admitted on 05/14/2024 with complaints of leg swelling. Patient previously admitted in July with work-up for abnormal proteins done at that time which showed M-spike on serum and urine labs.  Medical Oncology/Dr. Timmy following.  Abnormal Proteins  Amyloid/Myeloma -- Kappa FLC done 04/23/24 extremely elevated 1234.4.  -- SPEP shows M-spike noted at 0.1% with Urine protein electrophoresis 2.6.    -- Seen by Nephrology with concern for amyloidosis and/or multiple myeloma.  -- Bone survey done 8/23 shows no lytic or destructive lesion in the bones. -- Bone marrow biopsy done 05/21/24, path pending, will follow results. Bone marrow will need to be stained for amyloid.  -- Treatment will likely include subcutaneous therapy with daratumumab and Velcade.  -- Medical Oncology/Dr. Timmy following closely.  Patient initially wanted to follow at [facility], however states today that she prefers to follow with Dr. Timmy in [facility] outpatient oncology.    Anemia Thrombocytopenia  --Mild --Hemoglobin 10.9 and platelets 136K today --No transfusional intervention required at this time --Continue to monitor CBC with differential   CHF with LE edema Afib Pleural effusion -- On Eliquis  -- Status post mitral valve replacement in 2018 -- Imaging 8/23 shows moderate right and small left pleural effusions. -- Continue Cardio/Medical management   CKD Proteinuria  -- creatinine and BUN elevated -- Avoid nephrotoxic agents -- monitor renal function -- Nephrology following   Hypertension/Hypotension Diabetes -- Monitor blood pressure closely -- Monitor blood glucose levels -- Nephrology and Primary teams following        Code Status DNR-Limited  Subjective:  Patient seen awake and alert laying in bed. Weak appearing. She had bone marrow bx done earlier, states it was okay.   Reports that she wants to stay with Dr. Jessy oncology office in [facility] instead of [facility].  Patient pleasant, no acute distress is noted.  Objective:   Intake/Output Summary (Last 24 hours) at 05/21/2024 1328 Last data filed at 05/21/2024 1310 Gross per 24 hour  Intake 360 ml  Output 700 ml  Net -340 ml     PHYSICAL EXAMINATION: ECOG PERFORMANCE STATUS: 3 - Symptomatic, >50% confined to bed  Vitals:   05/21/24 0915 05/21/24 1108  BP: 109/63 (!) 105/48  Pulse: 87 86  Resp: 12 14  Temp:  98 F (36.7 C)  SpO2: 95% 95%   Filed Weights   05/19/24 0555 05/20/24 0329 05/21/24 0421  Weight: 124 lb 8 oz (56.5 kg) 125 lb 12.8 oz (57.1 kg) 127 lb 0.5 oz (57.6 kg)    GENERAL: alert, no distress and comfortable +weak-appearing SKIN: +Pale skin color, texture, turgor are normal, no rashes or significant lesions EYES: normal, conjunctiva are pink and non-injected, sclera clear OROPHARYNX: no exudate, no erythema and lips, buccal mucosa, and tongue normal  NECK: supple, thyroid  normal size, non-tender, without nodularity LYMPH: no palpable lymphadenopathy in the cervical, axillary or inguinal LUNGS: clear to auscultation and percussion with normal breathing effort HEART: regular rate & rhythm and no murmurs and no lower extremity edema ABDOMEN: abdomen soft, non-tender and normal bowel sounds MUSCULOSKELETAL: no cyanosis of digits and no clubbing  PSYCH: alert & oriented x 3 with fluent speech NEURO: no focal motor/sensory deficits   All questions were answered. The patient knows to call the clinic with any problems, questions or concerns.   The total time spent in the  appointment was 40 minutes encounter with patient including review of chart and various tests results, discussions about plan of care and coordination of care plan  Olam JINNY Brunner, NP 05/21/2024 1:28 PM    Labs Reviewed:  Lab Results  Component Value Date   WBC 7.1 05/21/2024   HGB 10.9 (L) 05/21/2024    HCT 33.3 (L) 05/21/2024   MCV 94.3 05/21/2024   PLT 136 (L) 05/21/2024   Recent Labs    04/15/24 1604 04/17/24 0531 05/14/24 0731 05/15/24 0449 05/16/24 0426 05/19/24 0532 05/20/24 0518 05/21/24 0512  NA 137   < > 135 139   < > 136 136 136  K 2.8*   < > 4.3 3.7   < > 3.7 4.0 4.2  CL 97*   < > 95* 98   < > 101 98 101  CO2 26   < > 30 31   < > 32 30 29  GLUCOSE 134*   < > 118* 102*   < > 110* 100* 97  BUN 29*   < > 33* 29*   < > 29* 29* 30*  CREATININE 1.32*   < > 1.34* 1.16*   < > 1.22* 1.13* 1.23*  CALCIUM  9.2   < > 10.0 9.4   < > 8.6* 8.7* 9.0  GFRNONAA 39*   < > 38* 46*   < > 43* 47* 43*  PROT 5.5*   < > 5.4* 4.9*  --  4.7*  --   --   ALBUMIN  2.8*   < > 2.8* 2.0*  --  1.9*  --   --   AST 36   < > 33 28  --  25  --   --   ALT 10   < > 13 14  --  13  --   --   ALKPHOS 94   < > 84 60  --  56  --   --   BILITOT 0.2   < > 0.3 0.6  --  0.5  --   --   BILIDIR <0.1  --   --   --   --   --   --   --   IBILI NOT CALCULATED  --   --   --   --   --   --   --    < > = values in this interval not displayed.    Studies Reviewed:  IR BONE MARROW BIOPSY & ASPIRATION Result Date: 05/21/2024 INDICATION: Elevated kappa light chain within serum and urine, concern for myeloma EXAM: FLUOROSCOPICALLY GUIDED RIGHT ILIAC BONE MARROW ASPIRATION AND CORE BIOPSY Date:  05/21/2024 05/21/2024 9:26 am Radiologist:  M. Frederic Specking, MD Guidance:  CT FLUOROSCOPY: Fluoroscopy Time: 0 minutes 6 seconds (1 mGy). MEDICATIONS: 1% LIDOCAINE  LOCAL ANESTHESIA/SEDATION: 2.0 mg IV Versed ; 100 mcg IV Fentanyl  Moderate Sedation Time:  7 MINUTES The patient was continuously monitored during the procedure by the interventional radiology nurse under my direct supervision. CONTRAST:  NONE. COMPLICATIONS: None PROCEDURE: Informed consent was obtained from the patient following explanation of the procedure, risks, benefits and alternatives. The patient understands, agrees and consents for the procedure. All questions were  addressed. A time out was performed. The patient was positioned prone and fluoroscopic localization was performed of the pelvis to demonstrate the iliac marrow spaces. Maximal barrier sterile technique utilized including caps, mask, sterile gowns, sterile gloves, large sterile drape, hand hygiene, and Betadine prep. Under sterile conditions and local anesthesia, an  11 gauge coaxial bone biopsy needle was advanced into the right iliac marrow space. Needle position was confirmed with fluoroscopic imaging. Initially, bone marrow aspiration was performed. Next, the 11 gauge outer cannula was utilized to obtain a right iliac bone marrow core biopsy. Needle was removed. Hemostasis was obtained with compression. The patient tolerated the procedure well. Samples were prepared with the cytotechnologist. No immediate complications. IMPRESSION: Fluoroscopic guided right iliac bone marrow aspiration and core biopsy. Electronically Signed   By: CHRISTELLA.  Shick M.D.   On: 05/21/2024 11:04   DG Bone Survey Met Result Date: 05/19/2024 CLINICAL DATA:  Monoclonal gammopathy of unknown significance. EXAM: METASTATIC BONE SURVEY COMPARISON:  09/11/2023. FINDINGS: No lytic or destructive lesion is seen within the bones. There is a moderate pleural effusion on the right and small pleural effusion on the left with associated atelectasis or infiltrate. The heart is enlarged and sternotomy wires are noted over the midline. There is atherosclerotic calcification of the aorta. Cervical spinal fusion hardware is noted. A multi lead pacemaker device is present over the left chest. Vascular calcifications are present in the lower extremities bilaterally. Degenerative changes are noted in the cervical, thoracic, and lumbar spine. Stable mild compression deformities are noted in the lower thoracic spine at T10 and T11. A stable compression deformity is seen in the superior endplate at L1. Lumbar spinal fusion hardware is noted at L5-S1. IMPRESSION:  1. No lytic or destructive lesion in the bones. 2. Stable compression deformities at T10, T11, and L1. 3. Moderate right pleural effusion and small left pleural effusion with atelectasis or infiltrate. Electronically Signed   By: Leita Birmingham M.D.   On: 05/19/2024 14:54   DG CHEST PORT 1 VIEW Result Date: 05/18/2024 CLINICAL DATA:  Pleural effusion.  CHF. EXAM: PORTABLE CHEST 1 VIEW COMPARISON:  Chest radiograph 05/14/2024 FINDINGS: Left-sided pacemaker remains in place. Cardiomegaly is stable. Prior median sternotomy. Aortic atherosclerosis. Right pleural effusion without significant interval change. There is a small left pleural effusion that has increased. Pulmonary edema is similar. No pneumothorax. IMPRESSION: 1. Right pleural effusion without significant interval change. 2. Small left pleural effusion has increased. 3. Pulmonary edema is similar. Electronically Signed   By: Andrea Gasman M.D.   On: 05/18/2024 15:09   CARDIAC CATHETERIZATION Result Date: 05/17/2024 Right heart catheterization via the right brachial vein showed mildly elevated filling pressures, moderate pulmonary hypertension and normal cardiac output. RA: 8 mmHg, RV 51/2 mmHg, PW: 18 mmHg, PA: 50/10 with a mean of 28 mmHg, cardiac output is 4.64 with an index of 2.92. Recommendations: She can likely be switched to an oral diuretic tomorrow.  DG Chest 2 View Result Date: 05/14/2024 CLINICAL DATA:  Shortness of breath. Cough. Bilateral leg swelling. EXAM: CHEST - 2 VIEW COMPARISON:  04/24/2024 and CT chest 09/10/2023. FINDINGS: Heart is enlarged, as before. Thoracic aorta is calcified. Pacemaker lead tips are in the right atrium and right ventricle. Mild interstitial prominence and indistinctness with a moderate right pleural effusion and small left pleural effusion. IMPRESSION: Congestive heart failure. Electronically Signed   By: Newell Eke M.D.   On: 05/14/2024 08:18   ECHOCARDIOGRAM COMPLETE Result Date: 04/24/2024     ECHOCARDIOGRAM REPORT   Patient Name:   MARIATERESA BATRA Kuhner Date of Exam: 04/24/2024 Medical Rec #:  [id number]      Height:       63.0 in Accession #:    [id number]     Weight:       115.0 lb Date  of Birth:  06-11-1935     BSA:          1.528 m Patient Age:    88 years       BP:           139/64 mmHg Patient Gender: F              HR:           61 bpm. Exam Location:  Inpatient Procedure: 2D Echo, Cardiac Doppler and Color Doppler (Both Spectral and Color            Flow Doppler were utilized during procedure). Indications:    CHF-Acute Diastolic I50.31  History:        Patient has prior history of Echocardiogram examinations, most                 recent 11/02/2023. CHF, CAD, Prior CABG, COPD and CKD, stage 3,                 Arrythmias:Atrial Fibrillation, Signs/Symptoms:Dyspnea; Risk                 Factors:Hypertension and Dyslipidemia.                  Mitral Valve: 29 mm Medtronic bioprosthetic valve valve is                 present in the mitral position. Procedure Date: 10/14/2016.  Sonographer:    Thea Norlander RCS Referring Phys: [id number] PAULA V ROSS IMPRESSIONS  1. No resting LVOT gradient. Left ventricular ejection fraction, by estimation, is 70 to 75%. The left ventricle has hyperdynamic function. The left ventricle has no regional wall motion abnormalities. Left ventricular diastolic parameters are indeterminate.  2. Right ventricular systolic function is mildly reduced. The right ventricular size is normal. There is moderately elevated pulmonary artery systolic pressure. The estimated right ventricular systolic pressure is 50.5 mmHg.  3. Left atrial size was severely dilated.  4. Right atrial size was mildly dilated.  5. Small, echodensity noted on the chordal apparatus unchanged from 2023 PLAX view. There new LVOT is small related to valve struts with LVOT flow acceleration. The mitral valve has been repaired/replaced. No evidence of mitral valve regurgitation. The mean mitral valve gradient is 8.0 mmHg.  There is a 29 mm Medtronic bioprosthetic valve present in the mitral position. Procedure Date: 10/14/2016.  6. Tricuspid valve regurgitation is moderate.  7. The aortic valve is tricuspid. Aortic valve regurgitation is not visualized. Mild to moderate aortic valve stenosis. Aortic valve area, by VTI measures 1.04 cm. Aortic valve mean gradient measures 19.0 mmHg. Aortic valve Vmax measures 3.02 m/s.  8. The inferior vena cava is dilated in size with <50% respiratory variability, suggesting right atrial pressure of 15 mmHg. Comparison(s): Prior images reviewed side by side. Mitral gradients have increased from 2023 with similar heart rates (60s); aortic valve gradients have increased; a portion of this is related to dynamic LV function. FINDINGS  Left Ventricle: No resting LVOT gradient. Left ventricular ejection fraction, by estimation, is 70 to 75%. The left ventricle has hyperdynamic function. The left ventricle has no regional wall motion abnormalities. The left ventricular internal cavity size was normal in size. There is no left ventricular hypertrophy. Left ventricular diastolic parameters are indeterminate. Right Ventricle: The right ventricular size is normal. No increase in right ventricular wall thickness. Right ventricular systolic function is mildly reduced. There is moderately elevated pulmonary artery systolic pressure. The tricuspid  regurgitant velocity is 2.98 m/s, and with an assumed right atrial pressure of 15 mmHg, the estimated right ventricular systolic pressure is 50.5 mmHg. Left Atrium: Left atrial size was severely dilated. Right Atrium: Right atrial size was mildly dilated. Pericardium: There is no evidence of pericardial effusion. Mitral Valve: Small, echodensity noted on the chordal apparatus unchanged from 2023 PLAX view. There new LVOT is small related to valve struts with LVOT flow acceleration. The mitral valve has been repaired/replaced. No evidence of mitral valve regurgitation.  There is a 29 mm Medtronic bioprosthetic valve present in the mitral position. Procedure Date: 10/14/2016. MV peak gradient, 23.7 mmHg. The mean mitral valve gradient is 8.0 mmHg with average heart rate of 60 bpm. Tricuspid Valve: The tricuspid valve is normal in structure. Tricuspid valve regurgitation is moderate . No evidence of tricuspid stenosis. Aortic Valve: The aortic valve is tricuspid. Aortic valve regurgitation is not visualized. Mild to moderate aortic stenosis is present. Aortic valve mean gradient measures 19.0 mmHg. Aortic valve peak gradient measures 36.5 mmHg. Aortic valve area, by VTI measures 1.04 cm. Pulmonic Valve: The pulmonic valve was normal in structure. Pulmonic valve regurgitation is trivial. No evidence of pulmonic stenosis. Aorta: The aortic root and ascending aorta are structurally normal, with no evidence of dilitation. Venous: The inferior vena cava is dilated in size with less than 50% respiratory variability, suggesting right atrial pressure of 15 mmHg. IAS/Shunts: The atrial septum is grossly normal.  LEFT VENTRICLE PLAX 2D LVIDd:         4.57 cm   Diastology LVIDs:         3.08 cm   LV e' medial:    6.16 cm/s LV PW:         0.64 cm   LV E/e' medial:  32.3 LV IVS:        0.93 cm   LV e' lateral:   8.92 cm/s LVOT diam:     1.60 cm   LV E/e' lateral: 22.3 LV SV:         63 LV SV Index:   41 LVOT Area:     2.01 cm  RIGHT VENTRICLE            IVC RV S prime:     7.22 cm/s  IVC diam: 2.27 cm TAPSE (M-mode): 1.6 cm LEFT ATRIUM              Index        RIGHT ATRIUM           Index LA diam:        5.80 cm  3.79 cm/m   RA Area:     21.15 cm LA Vol (A2C):   97.8 ml  63.99 ml/m  RA Volume:   59.55 ml  38.96 ml/m LA Vol (A4C):   119.0 ml 77.86 ml/m LA Biplane Vol: 108.0 ml 70.66 ml/m  AORTIC VALVE AV Area (Vmax):    1.09 cm AV Area (Vmean):   1.05 cm AV Area (VTI):     1.04 cm AV Vmax:           302.00 cm/s AV Vmean:          198.000 cm/s AV VTI:            0.604 m AV Peak Grad:       36.5 mmHg AV Mean Grad:      19.0 mmHg LVOT Vmax:         164.00 cm/s LVOT Vmean:  103.000 cm/s LVOT VTI:          0.313 m LVOT/AV VTI ratio: 0.52  AORTA Ao Root diam: 3.15 cm Ao Asc diam:  3.50 cm MITRAL VALVE                TRICUSPID VALVE MV Area (PHT): 2.34 cm     TR Peak grad:   35.5 mmHg MV Area VTI:   0.94 cm     TR Vmax:        298.00 cm/s MV Peak grad:  23.7 mmHg MV Mean grad:  8.0 mmHg     SHUNTS MV Vmax:       2.44 m/s     Systemic VTI:  0.31 m MV Vmean:      110.0 cm/s   Systemic Diam: 1.60 cm MV Decel Time: 324 msec MV E velocity: 199.00 cm/s Stanly Leavens MD Electronically signed by Stanly Leavens MD Signature Date/Time: 04/24/2024/5:19:23 PM    Final    IR THORACENTESIS ASP PLEURAL SPACE W/IMG GUIDE Result Date: 04/24/2024 INDICATION: In the hospital with CHF exacerbation. Right pleural effusion. IR consulted for diagnostic and therapeutic right thoracentesis. EXAM: ULTRASOUND GUIDED DIAGNOSTIC AND THERAPEUTIC RIGHT THORACENTESIS MEDICATIONS: 6 mL 1% lidocaine  COMPLICATIONS: None immediate. PROCEDURE: An ultrasound guided thoracentesis was thoroughly discussed with the patient and questions answered. The benefits, risks, alternatives and complications were also discussed. The patient understands and wishes to proceed with the procedure. Written consent was obtained. Ultrasound was performed to localize and mark an adequate pocket of fluid in the right chest. The area was then prepped and draped in the normal sterile fashion. 1% Lidocaine  was used for local anesthesia. Under ultrasound guidance a 6 Fr Safe-T-Centesis catheter was introduced. Thoracentesis was performed. The catheter was removed and a dressing applied. FINDINGS: A total of approximately 1.1 liters of clear yellow fluid was removed. Samples were sent to the laboratory as requested by the clinical team. IMPRESSION: Successful ultrasound guided right thoracentesis yielding 1.1 liters of pleural fluid. Performed and  dictated by Kimble Clas, PA-C Electronically Signed   By: CHRISTELLA.  Shick M.D.   On: 04/24/2024 14:42   DG Chest Port 1 View Result Date: 04/24/2024 CLINICAL DATA:  Status post thoracentesis. EXAM: PORTABLE CHEST 1 VIEW COMPARISON:  Chest radiograph dated 04/24/2019 side. FINDINGS: Interval decrease in the size of right pleural effusion with improved aeration of the right lung base. No pneumothorax. Small left pleural effusion as seen on the prior radiograph. There is cardiomegaly with vascular congestion. Median sternotomy wires and left pectoral pacemaker device. No acute osseous pathology. IMPRESSION: Interval decrease in the size of right pleural effusion with improved aeration of the right lung base. No pneumothorax. Electronically Signed   By: Vanetta Chou M.D.   On: 04/24/2024 14:30   DG Chest 1 View Result Date: 04/23/2024 CLINICAL DATA:  Follow-up EXAM: CHEST  1 VIEW COMPARISON:  04/15/2024 FINDINGS: Enlarging right pleural effusion, now moderate. Stable small left pleural effusion. Bilateral lower lobe airspace opacities are noted, right greater than left, worsening bilaterally since prior study. There is cardiomegaly. Pacer remains in place, unchanged. Prior CABG. IMPRESSION: Enlarging moderate right pleural effusion. Stable small left pleural effusion. Worsening bilateral lower lobe airspace disease, right greater than left. Cardiomegaly. Electronically Signed   By: Franky Crease M.D.   On: 04/23/2024 18:51

## 2024-05-21 NOTE — Progress Notes (Signed)
 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 Patient presented secondary to leg swelling and dyspnea, found to have evidence of acute heart failure / volume overload. IV diuresis started.  hospitalist called for admission and cardiology called in for consult.   Assessment & Plan:   Principal Problem:   Pleural effusion due to CHF (congestive heart failure) (HCC) Active Problems:   Atrial fibrillation (HCC)   S/P mitral valve replacement with bioprosthetic valve - Oct 14, 2016   Acute on chronic diastolic CHF (congestive heart failure) (HCC)   Essential hypertension   CKD stage 3a, GFR 45-59 ml/min (HCC)   Coronary artery disease   COPD  GOLD 2   Protein-calorie malnutrition, severe   Rheumatic heart disease   Sick sinus syndrome (HCC)   Persistent proteinuria  Acute on chronic diastolic heart failure > resolved. -Patient presented with shortness of breath / dyspnea with exertion and orthopnea. -Prior echo in July 2025 with 70 to 75% EF with indeterminate diastolic parameters. -Right heart cath without elevated right-sided pressures consistent with euvolemia. - Transitioned back to home Bumex .   Proteinuria : - Unclear etiology - Elevated M spike noted -oncology consulted for further recommendations. - Currently being evaluated for amyloidosis versus monoclonal gammopathy. - Increasing midodrine  as below - avoid nephrotoxins. - PET scan completed 23rd. - Potential treatment includes daratumumab and Velcade per oncology - Bone biopsy and renal biopsy pending -likely to be completed on 04/2524 - Once biopsies have been taken patient can  discharged for outpatient follow-up with oncology clinic for ongoing treatment. - Bone marrow biopsy completed today. Follow up oncology for further recommendation.    CKD stage IIIa, at baseline: - Baseline creatinine 1.2-1.4, remained stable despite increasing diuretics. - Nephrology to assist with further workup given proteinuria.   Hypokalemia: -Secondary to aggressive diuresis, replete, follow repeat labs.   Diffuse edema, improving: Pleural effusion Initially presumed to be secondary to heart failure, given right heart cath concern for possible secondary causes and proteinuria as above.   History of severe mitral valve stenosis s/p mitral valve replacement with bioprosthetic valve. Noted.  Cardiology following.   Atrial fibrillation, rate controlled: -Continue low-dose Eliquis  secondary to age and weight. -HR controlled.   Chronic hypotension: Increase midodrine  10 mg 3 times daily given moderately depressed blood pressures overnight and this morning.   Nerve pain/neuropathy - Appears to be chronic given patient reports prior sciatica pain - Overnight bilateral lower extremity paresthesias and pain - Transition Lyrica  to at bedtime only.   COPD -Continue Duoneb/albuterol .  CAD No chest pain. Not on aspirin , statin, or beta blocker.  Severe malnutrition Noted.  Rheumatic heart disease Noted. Stable.  Sick sinus syndrome S/p pacemaker   DVT prophylaxis: Eliquis  Code Status: DNR Family Communication: No family at bed side. Disposition Plan:  severity of illness.  Consultants:  IR Oncology Nephrology  Procedures: None  Antimicrobials:  Anti-infectives (From admission, onward)    None      Subjective: Patient was seen and examined at bedside.  Overnight events noted. Patient is status post bone biopsy. She states pain is not that bad.  She is asking for food because of being hungry.  Objective: Vitals:   05/21/24 0905  05/21/24 0910 05/21/24 0915  05/21/24 1108  BP: 127/86 120/70 109/63 (!) 105/48  Pulse: 90 89 87 86  Resp: 18 16 12 14   Temp:    98 F (36.7 C)  TempSrc:    Oral  SpO2: 98% 97% 95% 95%  Weight:      Height:        Intake/Output Summary (Last 24 hours) at 05/21/2024 1132 Last data filed at 05/21/2024 1122 Gross per 24 hour  Intake 360 ml  Output --  Net 360 ml   Filed Weights   05/19/24 0555 05/20/24 0329 05/21/24 0421  Weight: 56.5 kg 57.1 kg 57.6 kg    Examination:  General exam: Appears calm and comfortable, not in any acute distress. Respiratory system: Clear to auscultation. Respiratory effort normal.  RR 16 Cardiovascular system: S1 & S2 heard, RRR. No JVD, murmurs, rubs, gallops or clicks. Gastrointestinal system: Abdomen is non distended, soft and non tender. Normal bowel sounds heard. Central nervous system: Alert and oriented x 3. No focal neurological deficits. Extremities: Edema+, no cyanosis, no clubbing Skin: No rashes, lesions or ulcers Psychiatry: Judgement and insight appear normal. Mood & affect appropriate.     Data Reviewed: I have personally reviewed following labs and imaging studies  CBC: Recent Labs  Lab 05/15/24 0449 05/16/24 0426 05/17/24 1408 05/19/24 0532 05/21/24 0512  WBC 6.6 6.1  --  6.6 7.1  NEUTROABS  --   --   --  4.3 4.2  HGB 11.5* 10.8* 10.5*  10.2* 10.9* 10.9*  HCT 34.6* 32.2* 31.0*  30.0* 33.5* 33.3*  MCV 91.8 90.7  --  93.3 94.3  PLT 141* 138*  --  138* 136*   Basic Metabolic Panel: Recent Labs  Lab 05/15/24 0449 05/16/24 0426 05/18/24 0537 05/18/24 1402 05/19/24 0532 05/20/24 0518 05/21/24 0512  NA 139   < > 138 146* 136 136 136  K 3.7   < > 2.8* 3.5 3.7 4.0 4.2  CL 98   < > 98 101 101 98 101  CO2 31   < > 32 34* 32 30 29  GLUCOSE 102*   < > 100* 123* 110* 100* 97  BUN 29*   < > 28* 27* 29* 29* 30*  CREATININE 1.16*   < > 1.27* 1.20* 1.22* 1.13* 1.23*  CALCIUM  9.4   < > 9.0 9.5 8.6* 8.7* 9.0  MG 2.3  --   --   --   --   --   --   PHOS  4.3  --   --   --   --   --   --    < > = values in this interval not displayed.   GFR: Estimated Creatinine Clearance: 27.2 mL/min (A) (by C-G formula based on SCr of 1.23 mg/dL (H)). Liver Function Tests: Recent Labs  Lab 05/15/24 0449 05/19/24 0532  AST 28 25  ALT 14 13  ALKPHOS 60 56  BILITOT 0.6 0.5  PROT 4.9* 4.7*  ALBUMIN  2.0* 1.9*   No results for input(s): LIPASE, AMYLASE in the last 168 hours. No results for input(s): AMMONIA in the last 168 hours. Coagulation Profile: No results for input(s): INR, PROTIME in the last 168 hours. Cardiac Enzymes: No results for input(s): CKTOTAL, CKMB, CKMBINDEX, TROPONINI in the last 168 hours. BNP (last 3 results) Recent Labs    03/24/24 2240 04/15/24 1604 05/14/24 0731  PROBNP 5,712.0* 14,671.0* 15,910.0*   HbA1C: No results for input(s): HGBA1C in the last 72 hours.  CBG: Recent Labs  Lab 05/17/24 0803 05/17/24 1134 05/17/24 1702  GLUCAP 95 99 151*   Lipid Profile: No results for input(s): CHOL, HDL, LDLCALC, TRIG, CHOLHDL, LDLDIRECT in the last 72 hours. Thyroid  Function Tests: No results for input(s): TSH, T4TOTAL, FREET4, T3FREE, THYROIDAB in the last 72 hours. Anemia Panel: No results for input(s): VITAMINB12, FOLATE, FERRITIN, TIBC, IRON , RETICCTPCT in the last 72 hours. Sepsis Labs: No results for input(s): PROCALCITON, LATICACIDVEN in the last 168 hours.  Recent Results (from the past 240 hours)  Resp panel by RT-PCR (RSV, Flu A&B, Covid) Anterior Nasal Swab     Status: None   Collection Time: 05/14/24  7:31 AM   Specimen: Anterior Nasal Swab  Result Value Ref Range Status   SARS Coronavirus 2 by RT PCR NEGATIVE NEGATIVE Final    Comment: (NOTE) SARS-CoV-2 target nucleic acids are NOT DETECTED.  The SARS-CoV-2 RNA is generally detectable in upper respiratory specimens during the acute phase of infection. The lowest concentration of SARS-CoV-2  viral copies this assay can detect is 138 copies/mL. A negative result does not preclude SARS-Cov-2 infection and should not be used as the sole basis for treatment or other patient management decisions. A negative result may occur with  improper specimen collection/handling, submission of specimen other than nasopharyngeal swab, presence of viral mutation(s) within the areas targeted by this assay, and inadequate number of viral copies(<138 copies/mL). A negative result must be combined with clinical observations, patient history, and epidemiological information. The expected result is Negative.  Fact Sheet for Patients:  BloggerCourse.com  Fact Sheet for Healthcare Providers:  SeriousBroker.it  This test is no t yet approved or cleared by the [place]  FDA and  has been authorized for detection and/or diagnosis of SARS-CoV-2 by FDA under an Emergency Use Authorization (EUA). This EUA will remain  in effect (meaning this test can be used) for the duration of the COVID-19 declaration under Section 564(b)(1) of the Act, 21 U.S.C.section 360bbb-3(b)(1), unless the authorization is terminated  or revoked sooner.       Influenza A by PCR NEGATIVE NEGATIVE Final   Influenza B by PCR NEGATIVE NEGATIVE Final    Comment: (NOTE) The Xpert Xpress SARS-CoV-2/FLU/RSV plus assay is intended as an aid in the diagnosis of influenza from Nasopharyngeal swab specimens and should not be used as a sole basis for treatment. Nasal washings and aspirates are unacceptable for Xpert Xpress SARS-CoV-2/FLU/RSV testing.  Fact Sheet for Patients: BloggerCourse.com  Fact Sheet for Healthcare Providers: SeriousBroker.it  This test is not yet approved or cleared by the [place]  FDA and has been authorized for detection and/or diagnosis of SARS-CoV-2 by FDA under an Emergency Use Authorization  (EUA). This EUA will remain in effect (meaning this test can be used) for the duration of the COVID-19 declaration under Section 564(b)(1) of the Act, 21 U.S.C. section 360bbb-3(b)(1), unless the authorization is terminated or revoked.     Resp Syncytial Virus by PCR NEGATIVE NEGATIVE Final    Comment: (NOTE) Fact Sheet for Patients: BloggerCourse.com  Fact Sheet for Healthcare Providers: SeriousBroker.it  This test is not yet approved or cleared by the [place]  FDA and has been authorized for detection and/or diagnosis of SARS-CoV-2 by FDA under an Emergency Use Authorization (EUA). This EUA will remain in effect (meaning this test can be used) for the duration of the COVID-19 declaration under Section 564(b)(1) of the Act, 21 U.S.C. section 360bbb-3(b)(1), unless the authorization is terminated or revoked.  Performed at [facility], [address], [place], [place] [address]     Radiology Studies: IR BONE MARROW BIOPSY & ASPIRATION Result Date: 05/21/2024 INDICATION: Elevated kappa light chain within serum and urine, concern for myeloma EXAM: FLUOROSCOPICALLY GUIDED RIGHT ILIAC BONE MARROW ASPIRATION AND CORE BIOPSY Date:  05/21/2024 05/21/2024 9:26 am Radiologist:  M. Frederic Specking, MD Guidance:  CT FLUOROSCOPY: Fluoroscopy Time: 0 minutes 6 seconds (1 mGy). MEDICATIONS: 1% LIDOCAINE  LOCAL ANESTHESIA/SEDATION: 2.0 mg IV Versed ; 100 mcg IV Fentanyl  Moderate Sedation Time:  7 MINUTES The patient was continuously monitored during the procedure by the interventional radiology nurse under my direct supervision. CONTRAST:  NONE. COMPLICATIONS: None PROCEDURE: Informed consent was obtained from the patient following explanation of the procedure, risks, benefits and alternatives. The patient understands, agrees and consents for the procedure. All questions were addressed. A time out was performed. The patient was  positioned prone and fluoroscopic localization was performed of the pelvis to demonstrate the iliac marrow spaces. Maximal barrier sterile technique utilized including caps, mask, sterile gowns, sterile gloves, large sterile drape, hand hygiene, and Betadine prep. Under sterile conditions and local anesthesia, an 11 gauge coaxial bone biopsy needle was advanced into the right iliac marrow space. Needle position was confirmed with fluoroscopic imaging. Initially, bone marrow aspiration was performed. Next, the 11 gauge outer cannula was utilized to obtain a right iliac bone marrow core biopsy. Needle was removed. Hemostasis was obtained with compression. The patient tolerated the procedure well. Samples were prepared with the cytotechnologist. No immediate complications. IMPRESSION: Fluoroscopic guided right iliac bone marrow aspiration and core biopsy. Electronically Signed   By: CHRISTELLA.  Shick M.D.   On: 05/21/2024 11:04   Scheduled Meds:  apixaban   2.5 mg Oral BID   bumetanide   2 mg Oral Daily   diazepam   5 mg Oral QHS   docusate sodium   100 mg Oral BID   empagliflozin   10 mg Oral q AM   feeding supplement  1 Container Oral BID BM   feeding supplement  237 mL Oral BID BM   guaiFENesin   600 mg Oral BID   midodrine   10 mg Oral TID WC   multivitamin with minerals  1 tablet Oral Daily   neomycin -bacitracin -polymyxin   Topical BID   potassium chloride   40 mEq Oral Daily   pregabalin   100 mg Oral QHS   senna  1 tablet Oral BID   sodium chloride  flush  3 mL Intravenous Q12H   sodium chloride  flush  3 mL Intravenous Q12H   zinc  sulfate (50mg  elemental zinc )  220 mg Oral Daily   Continuous Infusions:   LOS: 7 days    Time spent: 50 mins    Darcel Dawley, MD [facility]   If 7PM-7AM, please contact night-coverage

## 2024-05-21 NOTE — Procedures (Signed)
 Interventional Radiology Procedure Note  Procedure: IR FLUORO RT ILIAC BM ASP AND CORE    Complications: None  Estimated Blood Loss:  MIN  Findings: 11 G CORE AND ASP    M. TREVOR Lainey Nelson, MD

## 2024-05-22 ENCOUNTER — Inpatient Hospital Stay (HOSPITAL_COMMUNITY)

## 2024-05-22 ENCOUNTER — Ambulatory Visit (HOSPITAL_BASED_OUTPATIENT_CLINIC_OR_DEPARTMENT_OTHER): Admitting: Cardiovascular Disease

## 2024-05-22 DIAGNOSIS — I509 Heart failure, unspecified: Secondary | ICD-10-CM | POA: Diagnosis not present

## 2024-05-22 LAB — CBC
HCT: 34 % — ABNORMAL LOW (ref 36.0–46.0)
Hemoglobin: 11.1 g/dL — ABNORMAL LOW (ref 12.0–15.0)
MCH: 30.5 pg (ref 26.0–34.0)
MCHC: 32.6 g/dL (ref 30.0–36.0)
MCV: 93.4 fL (ref 80.0–100.0)
Platelets: 137 K/uL — ABNORMAL LOW (ref 150–400)
RBC: 3.64 MIL/uL — ABNORMAL LOW (ref 3.87–5.11)
RDW: 13.8 % (ref 11.5–15.5)
WBC: 7.1 K/uL (ref 4.0–10.5)
nRBC: 0 % (ref 0.0–0.2)

## 2024-05-22 LAB — BASIC METABOLIC PANEL WITH GFR
Anion gap: 7 (ref 5–15)
BUN: 33 mg/dL — ABNORMAL HIGH (ref 8–23)
CO2: 28 mmol/L (ref 22–32)
Calcium: 9 mg/dL (ref 8.9–10.3)
Chloride: 101 mmol/L (ref 98–111)
Creatinine, Ser: 1.25 mg/dL — ABNORMAL HIGH (ref 0.44–1.00)
GFR, Estimated: 42 mL/min — ABNORMAL LOW (ref >60)
Glucose, Bld: 104 mg/dL — ABNORMAL HIGH (ref 70–99)
Potassium: 4.2 mmol/L (ref 3.5–5.1)
Sodium: 136 mmol/L (ref 135–145)

## 2024-05-22 LAB — MAGNESIUM: Magnesium: 2.3 mg/dL (ref 1.7–2.4)

## 2024-05-22 LAB — PHOSPHORUS: Phosphorus: 4.4 mg/dL (ref 2.5–4.6)

## 2024-05-22 MED ORDER — BUDESONIDE 0.5 MG/2ML IN SUSP
0.5000 mg | Freq: Once | RESPIRATORY_TRACT | Status: AC
  Administered 2024-05-22: 0.5 mg via RESPIRATORY_TRACT
  Filled 2024-05-22: qty 2

## 2024-05-22 MED ORDER — FUROSEMIDE 10 MG/ML IJ SOLN
40.0000 mg | Freq: Once | INTRAMUSCULAR | Status: AC
  Administered 2024-05-22: 40 mg via INTRAVENOUS
  Filled 2024-05-22: qty 4

## 2024-05-22 MED ORDER — IPRATROPIUM-ALBUTEROL 0.5-2.5 (3) MG/3ML IN SOLN
3.0000 mL | Freq: Once | RESPIRATORY_TRACT | Status: AC
  Administered 2024-05-22: 3 mL via RESPIRATORY_TRACT
  Filled 2024-05-22: qty 3

## 2024-05-22 MED ORDER — MONTELUKAST SODIUM 10 MG PO TABS
5.0000 mg | ORAL_TABLET | Freq: Every day | ORAL | Status: DC
  Administered 2024-05-22 – 2024-05-23 (×2): 5 mg via ORAL
  Filled 2024-05-22 (×2): qty 1

## 2024-05-22 NOTE — Progress Notes (Signed)
 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 Patient presented secondary to leg swelling and dyspnea, found to have evidence of acute heart failure / volume overload. IV diuresis started.  hospitalist called for admission and cardiology called in for consult.   Assessment & Plan:   Principal Problem:   Pleural effusion due to CHF (congestive heart failure) (HCC) Active Problems:   Atrial fibrillation (HCC)   S/P mitral valve replacement with bioprosthetic valve - Oct 14, 2016   Acute on chronic diastolic CHF (congestive heart failure) (HCC)   Essential hypertension   CKD stage 3a, GFR 45-59 ml/min (HCC)   Coronary artery disease   COPD  GOLD 2   Protein-calorie malnutrition, severe   Rheumatic heart disease   Sick sinus syndrome (HCC)   Persistent proteinuria  Acute on chronic diastolic heart failure > resolved. -Patient presented with shortness of breath / dyspnea with exertion and orthopnea. -Prior echo in July 2025 with 70 to 75% EF with indeterminate diastolic parameters. -Right heart cath without elevated right-sided pressures consistent with euvolemia. - Transitioned back to home Bumex .   Proteinuria : - Unclear etiology - Elevated M spike noted -oncology consulted for further recommendations. - Currently being evaluated for amyloidosis versus monoclonal gammopathy. - PET scan completed 23rd. - Potential treatment includes daratumumab and Velcade per oncology - Bone biopsy and renal biopsy pending -likely to be completed on 04/2524 - Once biopsies have been taken patient can discharged for outpatient follow-up with oncology clinic  for ongoing treatment. - Bone marrow biopsy completed 8/25. Follow up oncology for further recommendation.    CKD stage IIIa, at baseline: - Baseline creatinine 1.2-1.4, remained stable despite increasing diuretics. - Nephrology to assist with further workup given proteinuria.   Hypokalemia: -Secondary to aggressive diuresis, replete, Continue to monitor.   Diffuse edema, improving: Pleural effusion Initially presumed to be secondary to heart failure, given right heart cath concern for possible secondary causes and proteinuria as above.   History of severe mitral valve stenosis s/p mitral valve replacement with bioprosthetic valve. Noted.  Cardiology following.   Atrial fibrillation, rate controlled: -Continue low-dose Eliquis  secondary to age and weight. -HR controlled.   Chronic hypotension: Continue midodrine  10 mg 3 times daily given moderately depressed blood pressures overnight and this morning.   Nerve pain/neuropathy - Appears to be chronic given patient reports prior sciatica pain - Overnight bilateral lower extremity paresthesias and pain - Transition Lyrica  to at bedtime only.   COPD -Continue Duoneb/albuterol .  CAD: No chest pain. Not on aspirin , statin, or beta blocker.  Severe malnutrition Noted.  Rheumatic heart disease Noted. Stable.  Sick sinus syndrome S/p pacemaker.   DVT prophylaxis: Eliquis  Code Status: DNR Family Communication: No family at bed side. Disposition Plan:  severity of illness.  Consultants:  IR Oncology Nephrology  Procedures: None  Antimicrobials:  Anti-infectives (From admission, onward)    None      Subjective: Patient was seen and examined at bedside.  Overnight events noted. Patient is status post bone biopsy. States pain is better controlled. Patient reports shortness of breath, at home she uses inhalers.  Objective: Vitals:   05/21/24 2326 05/22/24 0027 05/22/24 0516 05/22/24 0907  BP:  117/61  117/66 (!)  121/59  Pulse:  (!) 59 (!) 59   Resp:  18 16 18   Temp:  (!) 97.4 F (36.3 C) 97.6 F (36.4 C) 98.3 F (36.8 C)  TempSrc:  Oral Oral Oral  SpO2: 95% 96% 97% 98%  Weight:   58 kg   Height:        Intake/Output Summary (Last 24 hours) at 05/22/2024 1138 Last data filed at 05/22/2024 0528 Gross per 24 hour  Intake --  Output 1250 ml  Net -1250 ml   Filed Weights   05/20/24 0329 05/21/24 0421 05/22/24 0516  Weight: 57.1 kg 57.6 kg 58 kg    Examination:  General exam: Appears calm and comfortable, not in any acute distress. Respiratory system: CTA bilaterally. Respiratory effort normal.  RR 14 Cardiovascular system: S1 & S2 heard, RRR. No JVD, murmurs, rubs, gallops or clicks. Gastrointestinal system: Abdomen is non distended, soft and non tender. Normal bowel sounds heard. Central nervous system: Alert and oriented x 3. No focal neurological deficits. Extremities: Edema+, no cyanosis, no clubbing Skin: No rashes, lesions or ulcers Psychiatry: Judgement and insight appear normal. Mood & affect appropriate.     Data Reviewed: I have personally reviewed following labs and imaging studies  CBC: Recent Labs  Lab 05/16/24 0426 05/17/24 1408 05/19/24 0532 05/21/24 0512 05/22/24 0614  WBC 6.1  --  6.6 7.1 7.1  NEUTROABS  --   --  4.3 4.2  --   HGB 10.8* 10.5*  10.2* 10.9* 10.9* 11.1*  HCT 32.2* 31.0*  30.0* 33.5* 33.3* 34.0*  MCV 90.7  --  93.3 94.3 93.4  PLT 138*  --  138* 136* 137*   Basic Metabolic Panel: Recent Labs  Lab 05/18/24 1402 05/19/24 0532 05/20/24 0518 05/21/24 0512 05/22/24 0614  NA 146* 136 136 136 136  K 3.5 3.7 4.0 4.2 4.2  CL 101 101 98 101 101  CO2 34* 32 30 29 28   GLUCOSE 123* 110* 100* 97 104*  BUN 27* 29* 29* 30* 33*  CREATININE 1.20* 1.22* 1.13* 1.23* 1.25*  CALCIUM  9.5 8.6* 8.7* 9.0 9.0  MG  --   --   --   --  2.3  PHOS  --   --   --   --  4.4   GFR: Estimated Creatinine Clearance: 26.7 mL/min (A) (by C-G formula based on SCr of  1.25 mg/dL (H)). Liver Function Tests: Recent Labs  Lab 05/19/24 0532  AST 25  ALT 13  ALKPHOS 56  BILITOT 0.5  PROT 4.7*  ALBUMIN  1.9*   No results for input(s): LIPASE, AMYLASE in the last 168 hours. No results for input(s): AMMONIA in the last 168 hours. Coagulation Profile: No results for input(s): INR, PROTIME in the last 168 hours. Cardiac Enzymes: No results for input(s): CKTOTAL, CKMB, CKMBINDEX, TROPONINI in the last 168 hours. BNP (last 3 results) Recent Labs    03/24/24 2240 04/15/24 1604 05/14/24 0731  PROBNP 5,712.0* 14,671.0* 15,910.0*   HbA1C: No results for input(s): HGBA1C in the last 72 hours. CBG: Recent Labs  Lab 05/17/24 0803 05/17/24 1134 05/17/24 1702  GLUCAP 95 99 151*   Lipid Profile: No results for input(s): CHOL, HDL, LDLCALC, TRIG, CHOLHDL, LDLDIRECT in the last 72 hours. Thyroid  Function Tests: No results for input(s): TSH, T4TOTAL, FREET4, T3FREE, THYROIDAB in the last 72 hours. Anemia Panel: No results for input(s): VITAMINB12, FOLATE, FERRITIN, TIBC, IRON , RETICCTPCT in the last 72 hours. Sepsis Labs: No results for input(s): PROCALCITON,  LATICACIDVEN in the last 168 hours.  Recent Results (from the past 240 hours)  Resp panel by RT-PCR (RSV, Flu A&B, Covid) Anterior Nasal Swab     Status: None   Collection Time: 05/14/24  7:31 AM   Specimen: Anterior Nasal Swab  Result Value Ref Range Status   SARS Coronavirus 2 by RT PCR NEGATIVE NEGATIVE Final    Comment: (NOTE) SARS-CoV-2 target nucleic acids are NOT DETECTED.  The SARS-CoV-2 RNA is generally detectable in upper respiratory specimens during the acute phase of infection. The lowest concentration of SARS-CoV-2 viral copies this assay can detect is 138 copies/mL. A negative result does not preclude SARS-Cov-2 infection and should not be used as the sole basis for treatment or other patient management decisions. A  negative result may occur with  improper specimen collection/handling, submission of specimen other than nasopharyngeal swab, presence of viral mutation(s) within the areas targeted by this assay, and inadequate number of viral copies(<138 copies/mL). A negative result must be combined with clinical observations, patient history, and epidemiological information. The expected result is Negative.  Fact Sheet for Patients:  BloggerCourse.com  Fact Sheet for Healthcare Providers:  SeriousBroker.it  This test is no t yet approved or cleared by the [place]  FDA and  has been authorized for detection and/or diagnosis of SARS-CoV-2 by FDA under an Emergency Use Authorization (EUA). This EUA will remain  in effect (meaning this test can be used) for the duration of the COVID-19 declaration under Section 564(b)(1) of the Act, 21 U.S.C.section 360bbb-3(b)(1), unless the authorization is terminated  or revoked sooner.       Influenza A by PCR NEGATIVE NEGATIVE Final   Influenza B by PCR NEGATIVE NEGATIVE Final    Comment: (NOTE) The Xpert Xpress SARS-CoV-2/FLU/RSV plus assay is intended as an aid in the diagnosis of influenza from Nasopharyngeal swab specimens and should not be used as a sole basis for treatment. Nasal washings and aspirates are unacceptable for Xpert Xpress SARS-CoV-2/FLU/RSV testing.  Fact Sheet for Patients: BloggerCourse.com  Fact Sheet for Healthcare Providers: SeriousBroker.it  This test is not yet approved or cleared by the [place]  FDA and has been authorized for detection and/or diagnosis of SARS-CoV-2 by FDA under an Emergency Use Authorization (EUA). This EUA will remain in effect (meaning this test can be used) for the duration of the COVID-19 declaration under Section 564(b)(1) of the Act, 21 U.S.C. section 360bbb-3(b)(1), unless the authorization  is terminated or revoked.     Resp Syncytial Virus by PCR NEGATIVE NEGATIVE Final    Comment: (NOTE) Fact Sheet for Patients: BloggerCourse.com  Fact Sheet for Healthcare Providers: SeriousBroker.it  This test is not yet approved or cleared by the [place]  FDA and has been authorized for detection and/or diagnosis of SARS-CoV-2 by FDA under an Emergency Use Authorization (EUA). This EUA will remain in effect (meaning this test can be used) for the duration of the COVID-19 declaration under Section 564(b)(1) of the Act, 21 U.S.C. section 360bbb-3(b)(1), unless the authorization is terminated or revoked.  Performed at [facility], [address], [place], [place] [address]     Radiology Studies: DG Chest 2 View Result Date: 05/22/2024 CLINICAL DATA:  Shortness of breath. EXAM: CHEST - 2 VIEW COMPARISON:  05/18/2024. FINDINGS: Low lung volume. Mild-to-moderate diffuse pulmonary vascular congestion, grossly similar to the prior study. Redemonstration of moderate right and small left pleural effusion without significant interval change. There are probable underlying compressive atelectatic changes in the bilateral lungs, also  right more than left. Bilateral lung fields are otherwise clear. No pneumothorax. Evaluation of cardiomediastinal silhouette is limited due to right lower hemithorax opacification. Sternotomy wires and left-sided dual lead cardiac pacemaker noted. No acute osseous abnormalities. Lower cervical spinal fixation hardware noted. The soft tissues are within normal limits. IMPRESSION: Mild-to-moderate pulmonary vascular congestion. Moderate right and small left pleural effusion. No significant interval change since the prior study. Electronically Signed   By: Ree Molt M.D.   On: 05/22/2024 08:59   IR BONE MARROW BIOPSY & ASPIRATION Result Date: 05/21/2024 INDICATION: Elevated kappa light chain  within serum and urine, concern for myeloma EXAM: FLUOROSCOPICALLY GUIDED RIGHT ILIAC BONE MARROW ASPIRATION AND CORE BIOPSY Date:  05/21/2024 05/21/2024 9:26 am Radiologist:  M. Frederic Specking, MD Guidance:  CT FLUOROSCOPY: Fluoroscopy Time: 0 minutes 6 seconds (1 mGy). MEDICATIONS: 1% LIDOCAINE  LOCAL ANESTHESIA/SEDATION: 2.0 mg IV Versed ; 100 mcg IV Fentanyl  Moderate Sedation Time:  7 MINUTES The patient was continuously monitored during the procedure by the interventional radiology nurse under my direct supervision. CONTRAST:  NONE. COMPLICATIONS: None PROCEDURE: Informed consent was obtained from the patient following explanation of the procedure, risks, benefits and alternatives. The patient understands, agrees and consents for the procedure. All questions were addressed. A time out was performed. The patient was positioned prone and fluoroscopic localization was performed of the pelvis to demonstrate the iliac marrow spaces. Maximal barrier sterile technique utilized including caps, mask, sterile gowns, sterile gloves, large sterile drape, hand hygiene, and Betadine prep. Under sterile conditions and local anesthesia, an 11 gauge coaxial bone biopsy needle was advanced into the right iliac marrow space. Needle position was confirmed with fluoroscopic imaging. Initially, bone marrow aspiration was performed. Next, the 11 gauge outer cannula was utilized to obtain a right iliac bone marrow core biopsy. Needle was removed. Hemostasis was obtained with compression. The patient tolerated the procedure well. Samples were prepared with the cytotechnologist. No immediate complications. IMPRESSION: Fluoroscopic guided right iliac bone marrow aspiration and core biopsy. Electronically Signed   By: CHRISTELLA.  Shick M.D.   On: 05/21/2024 11:04   Scheduled Meds:  apixaban   2.5 mg Oral BID   bumetanide   2 mg Oral Daily   diazepam   5 mg Oral QHS   docusate sodium   100 mg Oral BID   empagliflozin   10 mg Oral q AM   feeding  supplement  1 Container Oral BID BM   feeding supplement  237 mL Oral BID BM   guaiFENesin   600 mg Oral BID   midodrine   10 mg Oral TID WC   montelukast   5 mg Oral QHS   multivitamin with minerals  1 tablet Oral Daily   neomycin -bacitracin -polymyxin   Topical BID   potassium chloride   40 mEq Oral Daily   pregabalin   100 mg Oral QHS   senna  1 tablet Oral BID   sodium chloride  flush  3 mL Intravenous Q12H   sodium chloride  flush  3 mL Intravenous Q12H   zinc  sulfate (50mg  elemental zinc )  220 mg Oral Daily   Continuous Infusions:   LOS: 8 days    Time spent: 35 mins    Darcel Dawley, MD [facility]   If 7PM-7AM, please contact night-coverage

## 2024-05-22 NOTE — Progress Notes (Signed)
 Nutrition Follow-up  DOCUMENTATION CODES:   Severe malnutrition in context of chronic illness  INTERVENTION:   -Continue on soft diet, thin liquids -Continue Ensure Plus High Protein BID, Boost BID as pt likes to mix them -Continue Magic Cup BID -Continue MVI w/min -Daily weights -Zinc  lab resulted as deficient last admission, added Zinc  Sulfate daily     NUTRITION DIAGNOSIS:   Severe Malnutrition related to chronic illness, poor appetite as evidenced by severe muscle depletion, severe fat depletion, energy intake < 75% for > or equal to 3 months, edema.  Ongoing  GOAL:   Patient will meet greater than or equal to 90% of their needs  Progressing  MONITOR:   PO intake, Weight trends, Skin, Labs, Supplement acceptance  REASON FOR ASSESSMENT:   Consult Assessment of nutrition requirement/status  ASSESSMENT:   Hx CAD status post CABG status post CABG, rheumatic valvular disease with bioprosthetic MVR in 2018, PE A-fib status post maze on Eliquis  symptomatic bradycardia status post pacemaker in 2024 NSVT chronic diastolic CHF venous insufficiency moderate TR aortic stenosis, HTN, HLD CKD 3, COPD, spontaneous pneumothorax, chronic back pain, asthma. Recently admitted to [facility]. Re-admit with SOB, edema, constipation.  Spoke to pt at bedside. Pt denies n/v/c/d or chewing/swallowing difficulties. Last BM 8/25. Pt's intake is fair, appetite is better and pt is very motivated to eat/get better. Pt continues to drink EPHP and Boost Breeze mixed BID. Documented meal intake 56% x 5 days. Documented weight seems to be desirably increasing. Fluid accumulation is a consideration, however, edema has been improving/pt has been diuresed. Suspect weight gain r/t increased PO intake. Pt now being followed by oncology, bone and renal biopsy yesterday. Pt does also endorse SOB with more rapid onset yesterday, had a bad night last night d/t this. Pt states I just want to be able to breathe. Attending,  oncologist aware per pt/chart. Continued to discuss importance of kcal/pro intake, now more than ever r/t possible amyloidosis and/or multiple myeloma. Encouraged PO intake of whatever sounds good to pt, have family bring in food she likes/sounds good to not restrict herself. Pt verbalizes understanding. Pt denies additional questions/concerns at this time, will continue to monitor, RDN available prn.   Labs BG 97-104 BUN 33 Cr 1.25 Albumin  1.9 Prealbumin 14 GFR 42 H/H 11.1/34  Medications  apixaban   2.5 mg Oral BID   bumetanide   2 mg Oral Daily   diazepam   5 mg Oral QHS   docusate sodium   100 mg Oral BID   empagliflozin   10 mg Oral q AM   feeding supplement  1 Container Oral BID BM   feeding supplement  237 mL Oral BID BM   guaiFENesin   600 mg Oral BID   midodrine   10 mg Oral TID WC   montelukast   5 mg Oral QHS   multivitamin with minerals  1 tablet Oral Daily   neomycin -bacitracin -polymyxin   Topical BID   potassium chloride   40 mEq Oral Daily   pregabalin   100 mg Oral QHS   senna  1 tablet Oral BID   sodium chloride  flush  3 mL Intravenous Q12H   sodium chloride  flush  3 mL Intravenous Q12H   zinc  sulfate (50mg  elemental zinc )  220 mg Oral Daily      NUTRITION - FOCUSED PHYSICAL EXAM:  Flowsheet Row Most Recent Value  Orbital Region Moderate depletion  Upper Arm Region Severe depletion  Thoracic and Lumbar Region Severe depletion  Buccal Region Moderate depletion  Temple Region Severe depletion  Clavicle Bone Region Severe depletion  Clavicle and Acromion Bone Region Severe depletion  Scapular Bone Region Severe depletion  Dorsal Hand Severe depletion  Patellar Region Unable to assess  [BLE Mod/Severe Edema]  Anterior Thigh Region Unable to assess  [BLE Mod/Severe Edema]  Posterior Calf Region Unable to assess  [BLE Mod/Severe Edema]  Edema (RD Assessment) Severe  Hair Reviewed  Eyes Reviewed  Mouth Reviewed  Skin Reviewed  Nails Reviewed    Diet Order:    Diet Order             DIET SOFT Room service appropriate? Yes; Fluid consistency: Thin  Diet effective now                   EDUCATION NEEDS:   Education needs have been addressed  Skin:  Skin Assessment: Reviewed RN Assessment  Last BM:  8/17  Height:   Ht Readings from Last 1 Encounters:  05/14/24 5' 3 (1.6 m)    Weight:   Wt Readings from Last 1 Encounters:  05/22/24 58 kg    BMI:  Body mass index is 22.66 kg/m.  Estimated Nutritional Needs:   Kcal:  1425-1700 kcal  Protein:  70-85 g  Fluid:  1.5L  Aldric Wenzler Daml-Budig, RDN, LDN Registered Dietitian Nutritionist RD Inpatient Contact Info in [place]

## 2024-05-22 NOTE — Plan of Care (Signed)
  Problem: Education: Goal: Knowledge of General Education information will improve Description: Including pain rating scale, medication(s)/side effects and non-pharmacologic comfort measures Outcome: Progressing   Problem: Clinical Measurements: Goal: Ability to maintain clinical measurements within normal limits will improve Outcome: Progressing Problem: Clinical Measurements: Goal: Cardiovascular complication will be avoided Outcome: Progressing   Problem: Nutrition: Goal: Adequate nutrition will be maintained Outcome: Progressing   Problem: Pain Managment: Goal: General experience of comfort will improve and/or be controlled Outcome: Progressing   Problem: Safety: Goal: Ability to remain free from injury will improve Outcome: Progressing   Problem: Skin Integrity: Goal: Risk for impaired skin integrity will decrease Outcome: Progressing   Problem: Activity: Goal: Ability to return to baseline activity level will improve Outcome: Progressing    Problem: Clinical Measurements: Goal: Respiratory complications will improve Outcome: Progressing

## 2024-05-22 NOTE — Progress Notes (Signed)
 Ms. Hansley is having some shortness of breath this morning.  This is seem to happen all of a sudden.  She has a good oxygen  saturation of 97%.  Her rhythm is paced.  When I listen to her lungs, she has some wheezing.  I think she does need to have a nebulizer.  She did have her bone marrow biopsy yesterday.  I think the 24-hour urine was done.  Hopefully this was done properly and sent off for the light chains.  I know she has a ton of protein in her urine.  Her serum kappa light chain is over 1200 mg/L.  I am sure that she does have at least myeloma.  She may have amyloid.  The bone marrow will be stained for amyloid.   She does have low erythropoietin  level.  As such, we could certainly use ESA for her with respect to anemia issues.  As far as any treatment, I think that we probably could treat her with daratumumab and Velcade.  I realize that she is quite mature.  However, I think that she would be able to tolerate these treatments.  I would go ahead and get her on Singulair  right now.  We will have to see how this breathing issue goes for her.  I did have a nice prayer with her.  This did seem to help her and gave her a little bit more confidence.  Jeralyn Crease, MD  Ila 26:4

## 2024-05-22 NOTE — TOC Initial Note (Signed)
 Transition of Care [facility]) - Initial/Assessment Note    Patient Details  Name: Catherine [name] MRN: [id number] Date of Birth: 1935-03-11  Transition of Care [facility] CM/SW Contact:    Sudie Erminio Deems, RN Phone Number: 05/22/2024, 3:52 PM  Clinical Narrative:  Risk for readmission assessment completed. Patient presented for lower extremity swelling. PTA patient was from home with spouse. Patient has DME nebulizer machine in the home. Patient has been seen for outpatient PT at the [facility] location. Oncology is following. Case Manager will continue to follow for additional transition of care needs as the patient progresses.   Expected Discharge Plan: Home w Home Health Services Barriers to Discharge: Continued Medical Work up   Patient Goals and CMS Choice Patient states their goals for this hospitalization and ongoing recovery are:: Plans to return home once stable.          Expected Discharge Plan and Services In-house Referral: NA   Post Acute Care Choice: NA Living arrangements for the past 2 months: Single Family Home                   DME Agency: NA                  Prior Living Arrangements/Services Living arrangements for the past 2 months: Single Family Home Lives with:: Spouse Patient language and need for interpreter reviewed:: Yes Do you feel safe going back to the place where you live?: Yes      Need for Family Participation in Patient Care: No (Comment) Care giver support system in place?: No (comment)   Criminal Activity/Legal Involvement Pertinent to Current Situation/Hospitalization: No - Comment as needed  Activities of Daily Living   ADL Screening (condition at time of admission) Independently performs ADLs?: Yes (appropriate for developmental age) Is the patient deaf or have difficulty hearing?: No Does the patient have difficulty seeing, even when wearing glasses/contacts?: No Does the patient have difficulty concentrating,  remembering, or making decisions?: No  Permission Sought/Granted Permission sought to share information with : Family Supports, Case Manager                Emotional Assessment Appearance:: Appears stated age Attitude/Demeanor/Rapport: Engaged Affect (typically observed): Appropriate Orientation: : Oriented to Self, Oriented to Place Alcohol / Substance Use: Not Applicable Psych Involvement: No (comment)  Admission diagnosis:  Shortness of breath [R06.02] CHF (congestive heart failure) (HCC) [I50.9] Peripheral edema [R60.0] Patient Active Problem List   Diagnosis Date Noted   Persistent proteinuria 05/17/2024   Pleural effusion due to CHF (congestive heart failure) (HCC) 05/14/2024   Essential hypertension 04/21/2024   Chronic venous hypertension (idiopathic) with ulcer and inflammation of left lower extremity (HCC) 04/18/2024   Acute on chronic diastolic CHF (congestive heart failure) (HCC) 04/15/2024   CAP (community acquired pneumonia) 03/26/2024   Hyperkalemia 03/26/2024   Peripheral edema 03/26/2024   Acute hypoxemic respiratory failure (HCC) 03/24/2024   Sick sinus syndrome (HCC) 09/21/2023   Sinus pause 09/13/2023   Chronic anticoagulation 09/13/2023   S/P mitral valve replacement with bioprosthetic valve - Oct 14, 2016 09/12/2023   Chronic diastolic CHF (congestive heart failure) (HCC) 09/12/2023   CKD stage 3a, GFR 45-59 ml/min (HCC) 09/12/2023   Hypokalemia 09/12/2023   Pneumothorax on left 09/10/2023   Statin intolerance 09/08/2023   Sciatica 08/29/2023   Chronic low back pain 08/03/2022   AKI (acute kidney injury) (HCC) 06/22/2022   COPD  GOLD 2 03/02/2022   Acute on chronic heart failure  with preserved ejection fraction (HFpEF) (HCC) 01/05/2022   Tricuspid regurgitation 07/14/2021   Protein-calorie malnutrition, severe 09/05/2019   Enteritis 09/03/2019   COVID-19 virus infection 08/27/2019   Dysuria 07/07/2019   Chronic anxiety 12/23/2017   Aortic  stenosis, mild 07/26/2017   S/P Maze operation for atrial fibrillation 10/14/2016   S/P CABG x 1 10/14/2016   Coronary artery disease    Chronic insomnia 07/12/2016   DOE (dyspnea on exertion) 03/26/2015   Atrial fibrillation, chronic (HCC) 03/16/2015   Valvular heart disease 03/16/2015   Nephrolithiasis 03/16/2015   Primary hypertension    Hyperlipidemia LDL goal <70 04/28/2011   Rheumatic heart disease 01/05/2008   Atrial fibrillation (HCC) 01/05/2008   PCP:  Micheal Wolm ORN, MD Pharmacy:   [facility] [place], [place] - [address] [place] [address] Phone: [phone] Fax: [phone]  [facility] [address] [place] [address] Phone: [phone] Fax: [phone]  [facility] [address], [place] - [address] [place] [address] Phone: [phone] Fax: [phone]  [facility] [place], [place] - [address] [place] [address] Phone: [phone] Fax: [phone]  [facility] [address] [place] [address] Phone: [phone] Fax: [phone]     Social Drivers of Health (SDOH) Social History: SDOH Screenings   Food Insecurity: No Food Insecurity (05/14/2024)  Housing: Low Risk  (05/14/2024)  Transportation Needs: No Transportation Needs (05/14/2024)  Utilities: Not At Risk (05/14/2024)  Alcohol Screen: Low Risk  (11/06/2021)  Depression (PHQ2-9): Medium Risk (04/26/2024)  Financial Resource Strain: Low Risk  (04/09/2022)  Physical Activity: Insufficiently Active (04/09/2022)  Social Connections: Socially Integrated (05/14/2024)  Stress: No Stress Concern Present (04/09/2022)  Tobacco Use: Medium Risk (05/14/2024)   SDOH Interventions:     Readmission Risk Interventions    05/22/2024    3:49 PM 04/24/2024   12:04 PM 09/13/2023   12:13 PM  Readmission  Risk Prevention Plan  Transportation Screening Complete Complete Complete  PCP or Specialist Appt within 3-5 Days  Complete   HRI or Home Care Consult  Complete Complete  Social Work Consult for Recovery Care Planning/Counseling  Complete   Palliative Care Screening  Not Applicable Not Applicable  Medication Review Oceanographer) Referral to Pharmacy Referral to Pharmacy Complete  HRI or Home Care Consult Complete    SW Recovery Care/Counseling Consult Complete    Palliative Care Screening Not Applicable    Skilled Nursing Facility Not Applicable

## 2024-05-23 DIAGNOSIS — I509 Heart failure, unspecified: Secondary | ICD-10-CM | POA: Diagnosis not present

## 2024-05-23 LAB — BASIC METABOLIC PANEL WITH GFR
Anion gap: 6 (ref 5–15)
BUN: 34 mg/dL — ABNORMAL HIGH (ref 8–23)
CO2: 23 mmol/L (ref 22–32)
Calcium: 8.6 mg/dL — ABNORMAL LOW (ref 8.9–10.3)
Chloride: 106 mmol/L (ref 98–111)
Creatinine, Ser: 1.06 mg/dL — ABNORMAL HIGH (ref 0.44–1.00)
GFR, Estimated: 51 mL/min — ABNORMAL LOW (ref >60)
Glucose, Bld: 98 mg/dL (ref 70–99)
Potassium: 4.1 mmol/L (ref 3.5–5.1)
Sodium: 135 mmol/L (ref 135–145)

## 2024-05-23 MED ORDER — ALBUTEROL SULFATE (2.5 MG/3ML) 0.083% IN NEBU
2.5000 mg | INHALATION_SOLUTION | Freq: Three times a day (TID) | RESPIRATORY_TRACT | Status: DC
  Administered 2024-05-24: 2.5 mg via RESPIRATORY_TRACT
  Filled 2024-05-23: qty 3

## 2024-05-23 NOTE — Progress Notes (Signed)
 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 Patient presented secondary to leg swelling and dyspnea, found to have evidence of acute heart failure / volume overload. IV diuresis started.  hospitalist called for admission and cardiology called in for consult.   Assessment & Plan:   Principal Problem:   Pleural effusion due to CHF (congestive heart failure) (HCC) Active Problems:   Atrial fibrillation (HCC)   S/P mitral valve replacement with bioprosthetic valve - Oct 14, 2016   Acute on chronic diastolic CHF (congestive heart failure) (HCC)   Essential hypertension   CKD stage 3a, GFR 45-59 ml/min (HCC)   Coronary artery disease   COPD  GOLD 2   Protein-calorie malnutrition, severe   Rheumatic heart disease   Sick sinus syndrome (HCC)   Persistent proteinuria  Acute on chronic diastolic heart failure > resolved. -Patient presented with shortness of breath / dyspnea with exertion and orthopnea. -Prior echo in July 2025 with 70 to 75% EF with indeterminate diastolic parameters. -Right heart cath without elevated right-sided pressures consistent with euvolemia. - Transitioned back to home Bumex .   Proteinuria : - Unclear etiology - Elevated M spike noted -oncology consulted for further recommendations. - Currently being evaluated for amyloidosis versus monoclonal gammopathy. - PET scan completed 23rd. - Potential treatment includes daratumumab and Velcade per oncology - Bone biopsy and renal biopsy pending -likely to be completed on 04/2524 - Once biopsies have been taken patient can discharged for outpatient follow-up with oncology clinic  for ongoing treatment. - Bone marrow biopsy completed 8/25. Follow up oncology for further recommendation.    CKD stage IIIa, at baseline: - Baseline creatinine 1.2-1.4, remained stable despite increasing diuretics. - Nephrology to assist with further workup given proteinuria.   Hypokalemia: -Secondary to aggressive diuresis, replete, Continue to monitor.   Diffuse edema, improving: Pleural effusion Initially presumed to be secondary to heart failure, given right heart cath concern for possible secondary causes and proteinuria as above.   History of severe mitral valve stenosis s/p mitral valve replacement with bioprosthetic valve. Noted.  Cardiology following.   Atrial fibrillation, rate controlled: -Continue low-dose Eliquis  secondary to age and weight. -HR controlled.   Chronic hypotension: Continue midodrine  10 mg 3 times daily given moderately depressed blood pressures overnight and this morning.   Nerve pain/neuropathy - Appears to be chronic given patient reports prior sciatica pain - Overnight bilateral lower extremity paresthesias and pain - Transition Lyrica  to at bedtime only.   COPD -Continue Duoneb/albuterol .  CAD: No chest pain. Not on aspirin , statin, or beta blocker.  Severe malnutrition Noted.  Rheumatic heart disease Noted. Stable.  Sick sinus syndrome S/p pacemaker.   DVT prophylaxis: Eliquis  Code Status: DNR Family Communication: No family at bed side. Disposition Plan:  severity of illness.  Consultants:  IR Oncology Nephrology  Procedures: None  Antimicrobials:  Anti-infectives (From admission, onward)    None      Subjective: Patient was seen and examined at bedside.  Overnight events noted. Patient is status post bone biopsy. States pain is better controlled. Patient denies any other concerns.   Objective: Vitals:   05/23/24 0307 05/23/24 0630 05/23/24 0812 05/23/24 1214  BP: (!) 107/56  124/66 (!) 112/53  Pulse: 60  92 65   Resp: 18  17 16   Temp: 98.2 F (36.8 C)  98 F (36.7 C) 98.2 F (36.8 C)  TempSrc: Oral  Oral Oral  SpO2:   94% 97%  Weight:  57.6 kg    Height:        Intake/Output Summary (Last 24 hours) at 05/23/2024 1232 Last data filed at 05/22/2024 2100 Gross per 24 hour  Intake --  Output 300 ml  Net -300 ml   Filed Weights   05/21/24 0421 05/22/24 0516 05/23/24 0630  Weight: 57.6 kg 58 kg 57.6 kg    Examination:  General exam: Appears calm and comfortable, not in any acute distress. Respiratory system: CTA bilaterally. Respiratory effort normal.  RR 15 Cardiovascular system: S1 & S2 heard, RRR. No JVD, murmurs, rubs, gallops or clicks. Gastrointestinal system: Abdomen is non distended, soft and non tender. Normal bowel sounds heard. Central nervous system: Alert and oriented x 3. No focal neurological deficits. Extremities: Edema+, no cyanosis, no clubbing Skin: No rashes, lesions or ulcers Psychiatry: Judgement and insight appear normal. Mood & affect appropriate.     Data Reviewed: I have personally reviewed following labs and imaging studies  CBC: Recent Labs  Lab 05/17/24 1408 05/19/24 0532 05/21/24 0512 05/22/24 0614  WBC  --  6.6 7.1 7.1  NEUTROABS  --  4.3 4.2  --   HGB 10.5*  10.2* 10.9* 10.9* 11.1*  HCT 31.0*  30.0* 33.5* 33.3* 34.0*  MCV  --  93.3 94.3 93.4  PLT  --  138* 136* 137*   Basic Metabolic Panel: Recent Labs  Lab 05/19/24 0532 05/20/24 0518 05/21/24 0512 05/22/24 0614 05/23/24 0408  NA 136 136 136 136 135  K 3.7 4.0 4.2 4.2 4.1  CL 101 98 101 101 106  CO2 32 30 29 28 23   GLUCOSE 110* 100* 97 104* 98  BUN 29* 29* 30* 33* 34*  CREATININE 1.22* 1.13* 1.23* 1.25* 1.06*  CALCIUM  8.6* 8.7* 9.0 9.0 8.6*  MG  --   --   --  2.3  --   PHOS  --   --   --  4.4  --    GFR: Estimated Creatinine Clearance: 31.5 mL/min (A) (by C-G formula based on SCr of 1.06 mg/dL (H)). Liver Function Tests: Recent Labs  Lab 05/19/24 0532  AST 25  ALT 13   ALKPHOS 56  BILITOT 0.5  PROT 4.7*  ALBUMIN  1.9*   No results for input(s): LIPASE, AMYLASE in the last 168 hours. No results for input(s): AMMONIA in the last 168 hours. Coagulation Profile: No results for input(s): INR, PROTIME in the last 168 hours. Cardiac Enzymes: No results for input(s): CKTOTAL, CKMB, CKMBINDEX, TROPONINI in the last 168 hours. BNP (last 3 results) Recent Labs    03/24/24 2240 04/15/24 1604 05/14/24 0731  PROBNP 5,712.0* 14,671.0* 15,910.0*   HbA1C: No results for input(s): HGBA1C in the last 72 hours. CBG: Recent Labs  Lab 05/17/24 0803 05/17/24 1134 05/17/24 1702  GLUCAP 95 99 151*   Lipid Profile: No results for input(s): CHOL, HDL, LDLCALC, TRIG, CHOLHDL, LDLDIRECT in the last 72 hours. Thyroid  Function Tests: No results for input(s): TSH, T4TOTAL, FREET4, T3FREE, THYROIDAB in the last 72 hours. Anemia Panel: No results for input(s): VITAMINB12, FOLATE, FERRITIN, TIBC, IRON , RETICCTPCT in the last 72 hours. Sepsis Labs: No results for input(s): PROCALCITON, LATICACIDVEN in the last 168 hours.  Recent Results (from the past 240 hours)  Resp panel  by RT-PCR (RSV, Flu A&B, Covid) Anterior Nasal Swab     Status: None   Collection Time: 05/14/24  7:31 AM   Specimen: Anterior Nasal Swab  Result Value Ref Range Status   SARS Coronavirus 2 by RT PCR NEGATIVE NEGATIVE Final    Comment: (NOTE) SARS-CoV-2 target nucleic acids are NOT DETECTED.  The SARS-CoV-2 RNA is generally detectable in upper respiratory specimens during the acute phase of infection. The lowest concentration of SARS-CoV-2 viral copies this assay can detect is 138 copies/mL. A negative result does not preclude SARS-Cov-2 infection and should not be used as the sole basis for treatment or other patient management decisions. A negative result may occur with  improper specimen collection/handling, submission of specimen  other than nasopharyngeal swab, presence of viral mutation(s) within the areas targeted by this assay, and inadequate number of viral copies(<138 copies/mL). A negative result must be combined with clinical observations, patient history, and epidemiological information. The expected result is Negative.  Fact Sheet for Patients:  BloggerCourse.com  Fact Sheet for Healthcare Providers:  SeriousBroker.it  This test is no t yet approved or cleared by the [place]  FDA and  has been authorized for detection and/or diagnosis of SARS-CoV-2 by FDA under an Emergency Use Authorization (EUA). This EUA will remain  in effect (meaning this test can be used) for the duration of the COVID-19 declaration under Section 564(b)(1) of the Act, 21 U.S.C.section 360bbb-3(b)(1), unless the authorization is terminated  or revoked sooner.       Influenza A by PCR NEGATIVE NEGATIVE Final   Influenza B by PCR NEGATIVE NEGATIVE Final    Comment: (NOTE) The Xpert Xpress SARS-CoV-2/FLU/RSV plus assay is intended as an aid in the diagnosis of influenza from Nasopharyngeal swab specimens and should not be used as a sole basis for treatment. Nasal washings and aspirates are unacceptable for Xpert Xpress SARS-CoV-2/FLU/RSV testing.  Fact Sheet for Patients: BloggerCourse.com  Fact Sheet for Healthcare Providers: SeriousBroker.it  This test is not yet approved or cleared by the [place]  FDA and has been authorized for detection and/or diagnosis of SARS-CoV-2 by FDA under an Emergency Use Authorization (EUA). This EUA will remain in effect (meaning this test can be used) for the duration of the COVID-19 declaration under Section 564(b)(1) of the Act, 21 U.S.C. section 360bbb-3(b)(1), unless the authorization is terminated or revoked.     Resp Syncytial Virus by PCR NEGATIVE NEGATIVE Final     Comment: (NOTE) Fact Sheet for Patients: BloggerCourse.com  Fact Sheet for Healthcare Providers: SeriousBroker.it  This test is not yet approved or cleared by the [place]  FDA and has been authorized for detection and/or diagnosis of SARS-CoV-2 by FDA under an Emergency Use Authorization (EUA). This EUA will remain in effect (meaning this test can be used) for the duration of the COVID-19 declaration under Section 564(b)(1) of the Act, 21 U.S.C. section 360bbb-3(b)(1), unless the authorization is terminated or revoked.  Performed at [facility], [address] [place], [place] [address]     Radiology Studies: DG Chest 2 View Result Date: 05/22/2024 CLINICAL DATA:  Shortness of breath. EXAM: CHEST - 2 VIEW COMPARISON:  05/18/2024. FINDINGS: Low lung volume. Mild-to-moderate diffuse pulmonary vascular congestion, grossly similar to the prior study. Redemonstration of moderate right and small left pleural effusion without significant interval change. There are probable underlying compressive atelectatic changes in the bilateral lungs, also right more than left. Bilateral lung fields are otherwise clear. No pneumothorax. Evaluation of cardiomediastinal silhouette is  limited due to right lower hemithorax opacification. Sternotomy wires and left-sided dual lead cardiac pacemaker noted. No acute osseous abnormalities. Lower cervical spinal fixation hardware noted. The soft tissues are within normal limits. IMPRESSION: Mild-to-moderate pulmonary vascular congestion. Moderate right and small left pleural effusion. No significant interval change since the prior study. Electronically Signed   By: Ree Molt M.D.   On: 05/22/2024 08:59   Scheduled Meds:  apixaban   2.5 mg Oral BID   bumetanide   2 mg Oral Daily   diazepam   5 mg Oral QHS   empagliflozin   10 mg Oral q AM   feeding supplement  1 Container Oral BID BM    feeding supplement  237 mL Oral BID BM   guaiFENesin   600 mg Oral BID   midodrine   10 mg Oral TID WC   montelukast   5 mg Oral QHS   multivitamin with minerals  1 tablet Oral Daily   neomycin -bacitracin -polymyxin   Topical BID   potassium chloride   40 mEq Oral Daily   pregabalin   100 mg Oral QHS   senna  1 tablet Oral BID   zinc  sulfate (50mg  elemental zinc )  220 mg Oral Daily   Continuous Infusions:   LOS: 9 days    Time spent: 35 mins    Darcel Dawley, MD [facility]   If 7PM-7AM, please contact night-coverage

## 2024-05-23 NOTE — Plan of Care (Signed)
  Problem: Education: Goal: Knowledge of General Education information will improve Description: Including pain rating scale, medication(s)/side effects and non-pharmacologic comfort measures Outcome: Progressing   Problem: Clinical Measurements: Goal: Ability to maintain clinical measurements within normal limits will improve Outcome: Progressing   Problem: Health Behavior/Discharge Planning: Goal: Ability to manage health-related needs will improve Outcome: Progressing   Problem: Clinical Measurements: Goal: Will remain free from infection Outcome: Progressing   Problem: Clinical Measurements: Goal: Respiratory complications will improve Outcome: Progressing   Problem: Nutrition: Goal: Adequate nutrition will be maintained Outcome: Progressing   Problem: Pain Managment: Goal: General experience of comfort will improve and/or be controlled Outcome: Progressing   Problem: Safety: Goal: Ability to remain free from injury will improve Outcome: Progressing

## 2024-05-23 NOTE — Progress Notes (Signed)
 Mobility Specialist Progress Note;   05/23/24 1118  Mobility  Activity Ambulated with assistance  Level of Assistance Contact guard assist, steadying assist  Assistive Device Other (Comment) (HHA)  Distance Ambulated (ft) 200 ft  Activity Response Tolerated well  Mobility Referral Yes  Mobility visit 1 Mobility  Mobility Specialist Start Time (ACUTE ONLY) 1118  Mobility Specialist Stop Time (ACUTE ONLY) 1126  Mobility Specialist Time Calculation (min) (ACUTE ONLY) 8 min   Pt agreeable to mobility. Required MinG assistance via HHA to safely ambulate. C/o generalized weakness. SPO2 93%> throughout. Pt returned to chair and left with all needs met, call bell in reach.   Lauraine Erm Mobility Specialist Please contact via SecureChat or [employer] [phone]

## 2024-05-24 ENCOUNTER — Encounter: Payer: Self-pay | Admitting: Hematology & Oncology

## 2024-05-24 ENCOUNTER — Other Ambulatory Visit: Payer: Self-pay | Admitting: Hematology & Oncology

## 2024-05-24 ENCOUNTER — Other Ambulatory Visit (HOSPITAL_COMMUNITY): Payer: Self-pay

## 2024-05-24 ENCOUNTER — Telehealth (HOSPITAL_BASED_OUTPATIENT_CLINIC_OR_DEPARTMENT_OTHER): Payer: Self-pay | Admitting: Cardiovascular Disease

## 2024-05-24 ENCOUNTER — Encounter: Payer: Self-pay | Admitting: Cardiovascular Disease

## 2024-05-24 DIAGNOSIS — E854 Organ-limited amyloidosis: Secondary | ICD-10-CM | POA: Insufficient documentation

## 2024-05-24 DIAGNOSIS — E859 Amyloidosis, unspecified: Secondary | ICD-10-CM | POA: Insufficient documentation

## 2024-05-24 DIAGNOSIS — I509 Heart failure, unspecified: Secondary | ICD-10-CM | POA: Diagnosis not present

## 2024-05-24 DIAGNOSIS — E8581 Light chain (AL) amyloidosis: Secondary | ICD-10-CM

## 2024-05-24 HISTORY — DX: Amyloidosis, unspecified: E85.9

## 2024-05-24 LAB — BASIC METABOLIC PANEL WITH GFR
Anion gap: 12 (ref 5–15)
BUN: 36 mg/dL — ABNORMAL HIGH (ref 8–23)
CO2: 23 mmol/L (ref 22–32)
Calcium: 8.7 mg/dL — ABNORMAL LOW (ref 8.9–10.3)
Chloride: 102 mmol/L (ref 98–111)
Creatinine, Ser: 1.17 mg/dL — ABNORMAL HIGH (ref 0.44–1.00)
GFR, Estimated: 45 mL/min — ABNORMAL LOW (ref >60)
Glucose, Bld: 94 mg/dL (ref 70–99)
Potassium: 4.1 mmol/L (ref 3.5–5.1)
Sodium: 137 mmol/L (ref 135–145)

## 2024-05-24 LAB — UPEP/UIFE/LIGHT CHAINS/TP, 24-HR UR
Free Kappa Lt Chains,Ur: 2573.45 mg/L — ABNORMAL HIGH (ref 1.17–86.46)
Free Kappa/Lambda Ratio: 44.74 — ABNORMAL HIGH (ref 1.83–14.26)
Free Lambda Lt Chains,Ur: 57.52 mg/L — ABNORMAL HIGH (ref 0.27–15.21)
Total Protein, Urine-Ur/day: 11994 mg/(24.h) — ABNORMAL HIGH (ref 30–150)
Total Protein, Urine: 1199.4 mg/dL

## 2024-05-24 MED ORDER — MIDODRINE HCL 10 MG PO TABS
10.0000 mg | ORAL_TABLET | Freq: Three times a day (TID) | ORAL | 0 refills | Status: DC
  Filled 2024-05-24: qty 90, 30d supply, fill #0

## 2024-05-24 MED ORDER — PREGABALIN 100 MG PO CAPS
100.0000 mg | ORAL_CAPSULE | Freq: Every day | ORAL | 0 refills | Status: DC
  Filled 2024-05-24: qty 15, 15d supply, fill #0

## 2024-05-24 MED ORDER — ZINC SULFATE 220 (50 ZN) MG PO TABS
220.0000 mg | ORAL_TABLET | Freq: Every day | ORAL | 0 refills | Status: AC
  Filled 2024-05-24: qty 30, 30d supply, fill #0

## 2024-05-24 MED ORDER — MONTELUKAST SODIUM 5 MG PO CHEW
5.0000 mg | CHEWABLE_TABLET | Freq: Every day | ORAL | 0 refills | Status: DC
  Filled 2024-05-24: qty 30, 30d supply, fill #0
  Filled 2024-05-24: qty 15, 30d supply, fill #0

## 2024-05-24 MED ORDER — BUMETANIDE 1 MG PO TABS
2.0000 mg | ORAL_TABLET | Freq: Every day | ORAL | 0 refills | Status: DC
  Filled 2024-05-24: qty 60, 30d supply, fill #0

## 2024-05-24 NOTE — Telephone Encounter (Signed)
 Daughter is returning your call. Please advise

## 2024-05-24 NOTE — TOC Transition Note (Signed)
 Transition of Care [facility]) - Discharge Note   Patient Details  Name: Catherine [name] MRN: [id number] Date of Birth: August 17, 1935  [facility] CM/SW Contact:  Sudie Erminio Deems, RN Phone Number: 05/24/2024, 11:55 AM   Clinical Narrative: Case Manager spoke with patient regarding disposition needs. Patient is asking for [facility] RN and PT in the home. Patient did not have an agency preference for home health. Referral submitted to [facility] and start of care to begin within 24-48 hours post transition home. Staff RN did ambulate the patient and she does not qualify for oxygen  at this time. No further needs identified at this time.   Final next level of care: Home w Home Health Services Barriers to Discharge: No Barriers Identified   Patient Goals and CMS Choice Patient states their goals for this hospitalization and ongoing recovery are:: Plans to return home once stable.  Discharge Plan and Services Additional resources added to the After Visit Summary for   In-house Referral: NA Discharge Planning Services: CM Consult Post Acute Care Choice: NA            DME Agency: NA       HH Arranged: RN, Disease Management, PT HH Agency: [facility] Date [facility] Agency Contacted: 05/24/24 Time HH Agency Contacted: 1155 Representative spoke with at [facility] Agency: Amy  Social Drivers of Health (SDOH) Interventions SDOH Screenings   Food Insecurity: No Food Insecurity (05/14/2024)  Housing: Low Risk  (05/14/2024)  Transportation Needs: No Transportation Needs (05/14/2024)  Utilities: Not At Risk (05/14/2024)  Alcohol Screen: Low Risk  (11/06/2021)  Depression (PHQ2-9): Medium Risk (04/26/2024)  Financial Resource Strain: Low Risk  (04/09/2022)  Physical Activity: Insufficiently Active (04/09/2022)  Social Connections: Socially Integrated (05/14/2024)  Stress: No Stress Concern Present (04/09/2022)  Tobacco Use: Medium Risk (05/14/2024)     Readmission Risk  Interventions    05/22/2024    3:49 PM 04/24/2024   12:04 PM 09/13/2023   12:13 PM  Readmission Risk Prevention Plan  Transportation Screening Complete Complete Complete  PCP or Specialist Appt within 3-5 Days  Complete   HRI or Home Care Consult  Complete Complete  Social Work Consult for Recovery Care Planning/Counseling  Complete   Palliative Care Screening  Not Applicable Not Applicable  Medication Review Oceanographer) Referral to Pharmacy Referral to Pharmacy Complete  HRI or Home Care Consult Complete    SW Recovery Care/Counseling Consult Complete    Palliative Care Screening Not Applicable    Skilled Nursing Facility Not Applicable

## 2024-05-24 NOTE — Care Management Important Message (Signed)
 Important Message  Patient Details  Name: Catherine [name] MRN: [id number] Date of Birth: 1935-02-21   Important Message Given:        Catherine [name] 05/24/2024, 12:39 PM

## 2024-05-24 NOTE — Telephone Encounter (Signed)
 Patient's daughter calling to speak with [place], she did not want to leave a message.

## 2024-05-24 NOTE — Discharge Summary (Signed)
 Physician Discharge Summary  Catherine [name] FMW:[id number] DOB: 11/03/1934 DOA: 05/14/2024  PCP: Micheal Wolm ORN, MD  Admit date: 05/14/2024  Discharge date: 05/24/2024  Admitted From: Home  Disposition:  Home  Recommendations for Outpatient Follow-up:  Follow up with PCP in 1-2 weeks. Please obtain BMP/CBC in one week. Advised to follow-up with oncology Dr. Timmy as scheduled. Awaiting bone marrow biopsy result for initiation of treatment.  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 Patient presented secondary to leg swelling and dyspnea, found to have evidence of acute heart failure / volume overload. IV diuresis started. hospitalist called for admission and cardiology called in for consult.  Patient was managed appropriately and medications adjusted. She is back to her baseline.  She is found to have proteinuria , there is a suspicion for multiple myeloma.  Oncology was consulted who recommended bone biopsy which was completed,  awaiting biopsy report to initiate treatment.  PT and OT recommended home health services. Patient feels better and wants to be discharged home.  Patient will follow-up with Dr. Timmy for initiation of treatment and Bone biopsy report.  Patient being discharged home.  Discharge Diagnoses:  Principal Problem:   Pleural effusion due to CHF (congestive heart failure) (HCC) Active Problems:   Atrial fibrillation (HCC)   S/P mitral valve replacement with bioprosthetic valve - Oct 14, 2016   Acute on chronic diastolic CHF (congestive heart failure) (HCC)   Essential hypertension   CKD  stage 3a, GFR 45-59 ml/min (HCC)   Coronary artery disease   COPD  GOLD 2   Protein-calorie malnutrition, severe   Rheumatic heart disease   Sick sinus syndrome (HCC)   Persistent proteinuria  Acute on chronic diastolic heart failure > resolved. -Patient presented with shortness of breath / dyspnea with exertion and orthopnea. -Prior echo in July 2025 with 70 to 75% EF with indeterminate diastolic parameters. -Right heart cath without elevated right-sided pressures consistent with euvolemia. - Transitioned back to home Bumex .   Proteinuria : - Unclear etiology - Elevated M spike noted -oncology consulted for further recommendations. - Currently being evaluated for amyloidosis versus monoclonal gammopathy. - PET scan completed 23rd. - Potential treatment includes daratumumab and Velcade per oncology - Bone biopsy and renal biopsy pending -likely to be completed on 04/2524 - Once biopsies have been taken patient can discharged for outpatient follow-up with oncology clinic for ongoing treatment. - Bone marrow biopsy completed 8/25. Follow up oncology outpatient for further recommendation.     CKD stage IIIa, at baseline: - Baseline creatinine 1.2-1.4, remained stable despite increasing diuretics. - Nephrology to assist with further workup given proteinuria. - Follow-up oncology outpatient for initiation of treatment.   Hypokalemia: -Secondary to aggressive diuresis, replete, Continue to monitor.   Diffuse edema, improving: Pleural effusion Initially presumed to be secondary to heart failure, given right heart cath concern for possible secondary causes and proteinuria as above.   History of severe mitral valve stenosis s/p mitral valve replacement with bioprosthetic valve. Noted.  Cardiology following.   Atrial fibrillation, rate controlled: -Continue low-dose Eliquis  89 secondary to age and weight. -HR controlled.   Chronic hypotension: Continue midodrine  10 mg 3 times daily  given moderately depressed blood pressures overnight and  this morning.   Nerve pain/neuropathy - Appears to be chronic given patient reports prior sciatica pain - Overnight bilateral lower extremity paresthesias and pain - Transition Lyrica  to at bedtime only.   COPD -Continue Duoneb/albuterol .   CAD: No chest pain. Not on aspirin , statin, or beta blocker.   Severe malnutrition Noted.   Rheumatic heart disease Noted. Stable.   Sick sinus syndrome S/p pacemaker.    Discharge Instructions  Discharge Instructions     Call MD for:  persistant dizziness or light-headedness   Complete by: As directed    Call MD for:  redness, tenderness, or signs of infection (pain, swelling, redness, odor or green/yellow discharge around incision site)   Complete by: As directed    Call MD for:  temperature >100.4   Complete by: As directed    Diet - low sodium heart healthy   Complete by: As directed    Diet general   Complete by: As directed    Discharge instructions   Complete by: As directed    Advised to follow-up with primary care physician in 1 week. Advised to follow-up with oncology Dr. Timmy as scheduled. Awaiting bone marrow biopsy result for initiation of treatment.   Increase activity slowly   Complete by: As directed    No wound care   Complete by: As directed       Allergies as of 05/24/2024       Reactions   Aldactone  [spironolactone ] Other (See Comments)   Dyspnea    Amoxil  [amoxicillin ] Palpitations, Other (See Comments)   Tachycardia   Zebeta  [bisoprolol ] Shortness Of Breath   Calan  [verapamil ] Other (See Comments)   Myalgias    Crestor  [rosuvastatin ] Other (See Comments)   Myalgias    Flovent  [fluticasone  Propionate] Other (See Comments)   Leg cramps   Livalo  [pitavastatin ] Other (See Comments)   Myalgias    Lyrica  [pregabalin ] Swelling   Neurontin [gabapentin] Swelling   Norvasc  [amlodipine ] Swelling   Low extremity edema   Zetia  [ezetimibe ] Other (See  Comments)   Myalgias   Zocor  [simvastatin ] Other (See Comments)   Myalgias    Lotensin [benazepril] Cough   Polytrim  [polymyxin B -trimethoprim ] Swelling        Medication List     TAKE these medications    acetaminophen  500 MG tablet Commonly known as: TYLENOL  Take 500 mg by mouth every 6 (six) hours as needed for mild pain (pain score 1-3).   albuterol  (2.5 MG/3ML) 0.083% nebulizer solution Commonly known as: PROVENTIL  Take 3 mLs (2.5 mg total) by nebulization every 6 (six) hours as needed (during COPD Exacerbation).   albuterol  108 (90 Base) MCG/ACT inhaler Commonly known as: VENTOLIN  HFA INHALE 2 PUFFS INTO THE LUNGS EVERY 4 HOURS AS NEEDED FOR AHEEZING OR SHORTNESS OF BREATH   bumetanide  1 MG tablet Commonly known as: BUMEX  Take 2 tablets (2 mg total) by mouth daily. Start taking on: May 25, 2024 What changed:  how much to take when to take this reasons to take this   CertaVite/Antioxidants Tabs Take 1 tablet by mouth daily.   cholecalciferol 25 MCG (1000 UNIT) tablet Commonly known as: VITAMIN D3 Take 1,000 Units by mouth in the morning.   diazepam  5 MG tablet Commonly known as: VALIUM  TAKE ONE TABLET BY MOUTH AT BEDTIME   Eliquis  2.5 MG Tabs tablet Generic drug: apixaban  TAKE ONE TABLET TWICE DAILY (START 09/18/23 OR AS DIRECTED) What changed: See the new instructions.   empagliflozin  10 MG Tabs tablet Commonly known  as: JARDIANCE  Take 1 tablet (10 mg total) by mouth daily. What changed: when to take this   estradiol  0.1 MG/GM vaginal cream Commonly known as: ESTRACE  Place 1 Applicatorful vaginally See admin instructions. Apply a small amount vaginally three nights a week on Monday, Wednesday and Friday.   feeding supplement Liqd Take 237 mLs by mouth 2 (two) times daily between meals.   guaiFENesin  600 MG 12 hr tablet Commonly known as: MUCINEX  Take 1 tablet (600 mg total) by mouth 2 (two) times daily as needed for to loosen phlegm or  cough.   HYDROcodone -acetaminophen  5-325 MG tablet Commonly known as: NORCO/VICODIN TAKE ONE TABLET EVERY 8 HOURS AS NEEDED FOR PAIN   midodrine  10 MG tablet Commonly known as: PROAMATINE  Take 1 tablet (10 mg total) by mouth 3 (three) times daily with meals. What changed:  medication strength how much to take when to take this   montelukast  5 MG chewable tablet Commonly known as: SINGULAIR  Chew 1 tablet (5 mg total) by mouth at bedtime.   ondansetron  4 MG tablet Commonly known as: ZOFRAN  Take 1 tablet (4 mg total) by mouth every 8 (eight) hours as needed for nausea or vomiting.   pregabalin  100 MG capsule Commonly known as: LYRICA  Take 1 capsule (100 mg total) by mouth at bedtime for 15 days.   sodium chloride  0.65 % Soln nasal spray Commonly known as: OCEAN Place 1 spray into both nostrils as needed for congestion.   Zinc  Sulfate 220 (50 Zn) MG Tabs Take 1 tablet (220 mg total) by mouth daily. Start taking on: May 25, 2024               Durable Medical Equipment  (From admission, onward)           Start     Ordered   05/24/24 1106  For home use only DME oxygen   Once       Question Answer Comment  Length of Need 6 Months   Mode or (Route) Nasal cannula   Liters per Minute 2   Frequency Continuous (stationary and portable oxygen  unit needed)   Oxygen  conserving device Yes   Oxygen  delivery system Gas      05/24/24 1105            Follow-up Information     Micheal Wolm ORN, MD Follow up in 1 week(s).   Specialty: Family Medicine Contact information: [address] [place] [address] [phone]         Timmy Maude SAUNDERS, MD Follow up in 1 week(s).   Specialty: Oncology Contact information: [address] [place] [address] [phone]         [facility] Follow up.   Why: Home Health Registered Nurse and Physical Therapy-office to call with visit times. Contact  information: [address] [place] [address] [phone]                Allergies  Allergen Reactions   Aldactone  [Spironolactone ] Other (See Comments)    Dyspnea    Amoxil  [Amoxicillin ] Palpitations and Other (See Comments)    Tachycardia   Zebeta  [Bisoprolol ] Shortness Of Breath   Calan  [Verapamil ] Other (See Comments)    Myalgias    Crestor  [Rosuvastatin ] Other (See Comments)    Myalgias    Flovent  [Fluticasone  Propionate] Other (See Comments)    Leg cramps   Livalo  [Pitavastatin ] Other (See Comments)    Myalgias  Lyrica  [Pregabalin ] Swelling   Neurontin [Gabapentin] Swelling   Norvasc  [Amlodipine ] Swelling    Low extremity edema   Zetia  [Ezetimibe ] Other (See Comments)    Myalgias   Zocor  [Simvastatin ] Other (See Comments)    Myalgias    Lotensin [Benazepril] Cough   Polytrim  [Polymyxin B -Trimethoprim ] Swelling    Consultations: Oncology Nephrology   Procedures/Studies: DG Chest 2 View Result Date: 05/22/2024 CLINICAL DATA:  Shortness of breath. EXAM: CHEST - 2 VIEW COMPARISON:  05/18/2024. FINDINGS: Low lung volume. Mild-to-moderate diffuse pulmonary vascular congestion, grossly similar to the prior study. Redemonstration of moderate right and small left pleural effusion without significant interval change. There are probable underlying compressive atelectatic changes in the bilateral lungs, also right more than left. Bilateral lung fields are otherwise clear. No pneumothorax. Evaluation of cardiomediastinal silhouette is limited due to right lower hemithorax opacification. Sternotomy wires and left-sided dual lead cardiac pacemaker noted. No acute osseous abnormalities. Lower cervical spinal fixation hardware noted. The soft tissues are within normal limits. IMPRESSION: Mild-to-moderate pulmonary vascular congestion. Moderate right and small left pleural effusion. No significant interval change since the prior study. Electronically Signed   By:  Ree Molt M.D.   On: 05/22/2024 08:59   IR BONE MARROW BIOPSY & ASPIRATION Result Date: 05/21/2024 INDICATION: Elevated kappa light chain within serum and urine, concern for myeloma EXAM: FLUOROSCOPICALLY GUIDED RIGHT ILIAC BONE MARROW ASPIRATION AND CORE BIOPSY Date:  05/21/2024 05/21/2024 9:26 am Radiologist:  M. Frederic Specking, MD Guidance:  CT FLUOROSCOPY: Fluoroscopy Time: 0 minutes 6 seconds (1 mGy). MEDICATIONS: 1% LIDOCAINE  LOCAL ANESTHESIA/SEDATION: 2.0 mg IV Versed ; 100 mcg IV Fentanyl  Moderate Sedation Time:  7 MINUTES The patient was continuously monitored during the procedure by the interventional radiology nurse under my direct supervision. CONTRAST:  NONE. COMPLICATIONS: None PROCEDURE: Informed consent was obtained from the patient following explanation of the procedure, risks, benefits and alternatives. The patient understands, agrees and consents for the procedure. All questions were addressed. A time out was performed. The patient was positioned prone and fluoroscopic localization was performed of the pelvis to demonstrate the iliac marrow spaces. Maximal barrier sterile technique utilized including caps, mask, sterile gowns, sterile gloves, large sterile drape, hand hygiene, and Betadine prep. Under sterile conditions and local anesthesia, an 11 gauge coaxial bone biopsy needle was advanced into the right iliac marrow space. Needle position was confirmed with fluoroscopic imaging. Initially, bone marrow aspiration was performed. Next, the 11 gauge outer cannula was utilized to obtain a right iliac bone marrow core biopsy. Needle was removed. Hemostasis was obtained with compression. The patient tolerated the procedure well. Samples were prepared with the cytotechnologist. No immediate complications. IMPRESSION: Fluoroscopic guided right iliac bone marrow aspiration and core biopsy. Electronically Signed   By: CHRISTELLA.  Shick M.D.   On: 05/21/2024 11:04   DG Bone Survey Met Result Date:  05/19/2024 CLINICAL DATA:  Monoclonal gammopathy of unknown significance. EXAM: METASTATIC BONE SURVEY COMPARISON:  09/11/2023. FINDINGS: No lytic or destructive lesion is seen within the bones. There is a moderate pleural effusion on the right and small pleural effusion on the left with associated atelectasis or infiltrate. The heart is enlarged and sternotomy wires are noted over the midline. There is atherosclerotic calcification of the aorta. Cervical spinal fusion hardware is noted. A multi lead pacemaker device is present over the left chest. Vascular calcifications are present in the lower extremities bilaterally. Degenerative changes are noted in the cervical, thoracic, and lumbar spine. Stable mild compression deformities are noted in  the lower thoracic spine at T10 and T11. A stable compression deformity is seen in the superior endplate at L1. Lumbar spinal fusion hardware is noted at L5-S1. IMPRESSION: 1. No lytic or destructive lesion in the bones. 2. Stable compression deformities at T10, T11, and L1. 3. Moderate right pleural effusion and small left pleural effusion with atelectasis or infiltrate. Electronically Signed   By: Leita Birmingham M.D.   On: 05/19/2024 14:54   DG CHEST PORT 1 VIEW Result Date: 05/18/2024 CLINICAL DATA:  Pleural effusion.  CHF. EXAM: PORTABLE CHEST 1 VIEW COMPARISON:  Chest radiograph 05/14/2024 FINDINGS: Left-sided pacemaker remains in place. Cardiomegaly is stable. Prior median sternotomy. Aortic atherosclerosis. Right pleural effusion without significant interval change. There is a small left pleural effusion that has increased. Pulmonary edema is similar. No pneumothorax. IMPRESSION: 1. Right pleural effusion without significant interval change. 2. Small left pleural effusion has increased. 3. Pulmonary edema is similar. Electronically Signed   By: Andrea Gasman M.D.   On: 05/18/2024 15:09   CARDIAC CATHETERIZATION Result Date: 05/17/2024 Right heart catheterization  via the right brachial vein showed mildly elevated filling pressures, moderate pulmonary hypertension and normal cardiac output. RA: 8 mmHg, RV 51/2 mmHg, PW: 18 mmHg, PA: 50/10 with a mean of 28 mmHg, cardiac output is 4.64 with an index of 2.92. Recommendations: She can likely be switched to an oral diuretic tomorrow.  DG Chest 2 View Result Date: 05/14/2024 CLINICAL DATA:  Shortness of breath. Cough. Bilateral leg swelling. EXAM: CHEST - 2 VIEW COMPARISON:  04/24/2024 and CT chest 09/10/2023. FINDINGS: Heart is enlarged, as before. Thoracic aorta is calcified. Pacemaker lead tips are in the right atrium and right ventricle. Mild interstitial prominence and indistinctness with a moderate right pleural effusion and small left pleural effusion. IMPRESSION: Congestive heart failure. Electronically Signed   By: Newell Eke M.D.   On: 05/14/2024 08:18   ECHOCARDIOGRAM COMPLETE Result Date: 04/24/2024    ECHOCARDIOGRAM REPORT   Patient Name:   AYEZA THERRIAULT Broaddus Date of Exam: 04/24/2024 Medical Rec #:  [id number]      Height:       63.0 in Accession #:    [id number]     Weight:       115.0 lb Date of Birth:  11/03/1934     BSA:          1.528 m Patient Age:    88 years       BP:           139/64 mmHg Patient Gender: F              HR:           61 bpm. Exam Location:  Inpatient Procedure: 2D Echo, Cardiac Doppler and Color Doppler (Both Spectral and Color            Flow Doppler were utilized during procedure). Indications:    CHF-Acute Diastolic I50.31  History:        Patient has prior history of Echocardiogram examinations, most                 recent 11/02/2023. CHF, CAD, Prior CABG, COPD and CKD, stage 3,                 Arrythmias:Atrial Fibrillation, Signs/Symptoms:Dyspnea; Risk                 Factors:Hypertension and Dyslipidemia.  Mitral Valve: 29 mm Medtronic bioprosthetic valve valve is                 present in the mitral position. Procedure Date: 10/14/2016.  Sonographer:    Thea Norlander RCS Referring Phys: [id number] PAULA V ROSS IMPRESSIONS  1. No resting LVOT gradient. Left ventricular ejection fraction, by estimation, is 70 to 75%. The left ventricle has hyperdynamic function. The left ventricle has no regional wall motion abnormalities. Left ventricular diastolic parameters are indeterminate.  2. Right ventricular systolic function is mildly reduced. The right ventricular size is normal. There is moderately elevated pulmonary artery systolic pressure. The estimated right ventricular systolic pressure is 50.5 mmHg.  3. Left atrial size was severely dilated.  4. Right atrial size was mildly dilated.  5. Small, echodensity noted on the chordal apparatus unchanged from 2023 PLAX view. There new LVOT is small related to valve struts with LVOT flow acceleration. The mitral valve has been repaired/replaced. No evidence of mitral valve regurgitation. The mean mitral valve gradient is 8.0 mmHg. There is a 29 mm Medtronic bioprosthetic valve present in the mitral position. Procedure Date: 10/14/2016.  6. Tricuspid valve regurgitation is moderate.  7. The aortic valve is tricuspid. Aortic valve regurgitation is not visualized. Mild to moderate aortic valve stenosis. Aortic valve area, by VTI measures 1.04 cm. Aortic valve mean gradient measures 19.0 mmHg. Aortic valve Vmax measures 3.02 m/s.  8. The inferior vena cava is dilated in size with <50% respiratory variability, suggesting right atrial pressure of 15 mmHg. Comparison(s): Prior images reviewed side by side. Mitral gradients have increased from 2023 with similar heart rates (60s); aortic valve gradients have increased; a portion of this is related to dynamic LV function. FINDINGS  Left Ventricle: No resting LVOT gradient. Left ventricular ejection fraction, by estimation, is 70 to 75%. The left ventricle has hyperdynamic function. The left ventricle has no regional wall motion abnormalities. The left ventricular internal cavity size was normal  in size. There is no left ventricular hypertrophy. Left ventricular diastolic parameters are indeterminate. Right Ventricle: The right ventricular size is normal. No increase in right ventricular wall thickness. Right ventricular systolic function is mildly reduced. There is moderately elevated pulmonary artery systolic pressure. The tricuspid regurgitant velocity is 2.98 m/s, and with an assumed right atrial pressure of 15 mmHg, the estimated right ventricular systolic pressure is 50.5 mmHg. Left Atrium: Left atrial size was severely dilated. Right Atrium: Right atrial size was mildly dilated. Pericardium: There is no evidence of pericardial effusion. Mitral Valve: Small, echodensity noted on the chordal apparatus unchanged from 2023 PLAX view. There new LVOT is small related to valve struts with LVOT flow acceleration. The mitral valve has been repaired/replaced. No evidence of mitral valve regurgitation. There is a 29 mm Medtronic bioprosthetic valve present in the mitral position. Procedure Date: 10/14/2016. MV peak gradient, 23.7 mmHg. The mean mitral valve gradient is 8.0 mmHg with average heart rate of 60 bpm. Tricuspid Valve: The tricuspid valve is normal in structure. Tricuspid valve regurgitation is moderate . No evidence of tricuspid stenosis. Aortic Valve: The aortic valve is tricuspid. Aortic valve regurgitation is not visualized. Mild to moderate aortic stenosis is present. Aortic valve mean gradient measures 19.0 mmHg. Aortic valve peak gradient measures 36.5 mmHg. Aortic valve area, by VTI measures 1.04 cm. Pulmonic Valve: The pulmonic valve was normal in structure. Pulmonic valve regurgitation is trivial. No evidence of pulmonic stenosis. Aorta: The aortic root and ascending aorta are structurally normal,  with no evidence of dilitation. Venous: The inferior vena cava is dilated in size with less than 50% respiratory variability, suggesting right atrial pressure of 15 mmHg. IAS/Shunts: The atrial  septum is grossly normal.  LEFT VENTRICLE PLAX 2D LVIDd:         4.57 cm   Diastology LVIDs:         3.08 cm   LV e' medial:    6.16 cm/s LV PW:         0.64 cm   LV E/e' medial:  32.3 LV IVS:        0.93 cm   LV e' lateral:   8.92 cm/s LVOT diam:     1.60 cm   LV E/e' lateral: 22.3 LV SV:         63 LV SV Index:   41 LVOT Area:     2.01 cm  RIGHT VENTRICLE            IVC RV S prime:     7.22 cm/s  IVC diam: 2.27 cm TAPSE (M-mode): 1.6 cm LEFT ATRIUM              Index        RIGHT ATRIUM           Index LA diam:        5.80 cm  3.79 cm/m   RA Area:     21.15 cm LA Vol (A2C):   97.8 ml  63.99 ml/m  RA Volume:   59.55 ml  38.96 ml/m LA Vol (A4C):   119.0 ml 77.86 ml/m LA Biplane Vol: 108.0 ml 70.66 ml/m  AORTIC VALVE AV Area (Vmax):    1.09 cm AV Area (Vmean):   1.05 cm AV Area (VTI):     1.04 cm AV Vmax:           302.00 cm/s AV Vmean:          198.000 cm/s AV VTI:            0.604 m AV Peak Grad:      36.5 mmHg AV Mean Grad:      19.0 mmHg LVOT Vmax:         164.00 cm/s LVOT Vmean:        103.000 cm/s LVOT VTI:          0.313 m LVOT/AV VTI ratio: 0.52  AORTA Ao Root diam: 3.15 cm Ao Asc diam:  3.50 cm MITRAL VALVE                TRICUSPID VALVE MV Area (PHT): 2.34 cm     TR Peak grad:   35.5 mmHg MV Area VTI:   0.94 cm     TR Vmax:        298.00 cm/s MV Peak grad:  23.7 mmHg MV Mean grad:  8.0 mmHg     SHUNTS MV Vmax:       2.44 m/s     Systemic VTI:  0.31 m MV Vmean:      110.0 cm/s   Systemic Diam: 1.60 cm MV Decel Time: 324 msec MV E velocity: 199.00 cm/s Stanly Leavens MD Electronically signed by Stanly Leavens MD Signature Date/Time: 04/24/2024/5:19:23 PM    Final      Subjective: Patient was seen and examined at bedside.  Overnight events noted. Patient reports feeling much improved and wants to be discharged.  Discharge Exam: Vitals:   05/24/24 0719 05/24/24 0800  BP:  ROLLEN)  108/58  Pulse: 61 (!) 58  Resp: 19 18  Temp:  98.2 F (36.8 C)  SpO2: 97% 97%   Vitals:    05/24/24 0200 05/24/24 0343 05/24/24 0719 05/24/24 0800  BP:  (!) 122/57  (!) 108/58  Pulse: (!) 59 (!) 59 61 (!) 58  Resp:  18 19 18   Temp:  97.8 F (36.6 C)  98.2 F (36.8 C)  TempSrc:  Oral  Oral  SpO2: 97% 98% 97% 97%  Weight:  59.1 kg    Height:        General: Pt is alert, awake, not in acute distress Cardiovascular: RRR, S1/S2 +, no rubs, no gallops Respiratory: CTA bilaterally, no wheezing, no rhonchi Abdominal: Soft, NT, ND, bowel sounds + Extremities: no edema, no cyanosis    The results of significant diagnostics from this hospitalization (including imaging, microbiology, ancillary and laboratory) are listed below for reference.     Microbiology: No results found for this or any previous visit (from the past 240 hours).   Labs: BNP (last 3 results) No results for input(s): BNP in the last 8760 hours. Basic Metabolic Panel: Recent Labs  Lab 05/20/24 0518 05/21/24 0512 05/22/24 0614 05/23/24 0408 05/24/24 0455  NA 136 136 136 135 137  K 4.0 4.2 4.2 4.1 4.1  CL 98 101 101 106 102  CO2 30 29 28 23 23   GLUCOSE 100* 97 104* 98 94  BUN 29* 30* 33* 34* 36*  CREATININE 1.13* 1.23* 1.25* 1.06* 1.17*  CALCIUM  8.7* 9.0 9.0 8.6* 8.7*  MG  --   --  2.3  --   --   PHOS  --   --  4.4  --   --    Liver Function Tests: Recent Labs  Lab 05/19/24 0532  AST 25  ALT 13  ALKPHOS 56  BILITOT 0.5  PROT 4.7*  ALBUMIN  1.9*   No results for input(s): LIPASE, AMYLASE in the last 168 hours. No results for input(s): AMMONIA in the last 168 hours. CBC: Recent Labs  Lab 05/19/24 0532 05/21/24 0512 05/22/24 0614  WBC 6.6 7.1 7.1  NEUTROABS 4.3 4.2  --   HGB 10.9* 10.9* 11.1*  HCT 33.5* 33.3* 34.0*  MCV 93.3 94.3 93.4  PLT 138* 136* 137*   Cardiac Enzymes: No results for input(s): CKTOTAL, CKMB, CKMBINDEX, TROPONINI in the last 168 hours. BNP: Invalid input(s): POCBNP CBG: Recent Labs  Lab 05/17/24 1702  GLUCAP 151*   D-Dimer No results  for input(s): DDIMER in the last 72 hours. Hgb A1c No results for input(s): HGBA1C in the last 72 hours. Lipid Profile No results for input(s): CHOL, HDL, LDLCALC, TRIG, CHOLHDL, LDLDIRECT in the last 72 hours. Thyroid  function studies No results for input(s): TSH, T4TOTAL, T3FREE, THYROIDAB in the last 72 hours.  Invalid input(s): FREET3 Anemia work up No results for input(s): VITAMINB12, FOLATE, FERRITIN, TIBC, IRON , RETICCTPCT in the last 72 hours. Urinalysis    Component Value Date/Time   COLORURINE YELLOW 05/16/2024 1038   APPEARANCEUR CLEAR 05/16/2024 1038   APPEARANCEUR Cloudy (A) 07/22/2022 1513   LABSPEC 1.010 05/16/2024 1038   PHURINE 7.0 05/16/2024 1038   GLUCOSEU 50 (A) 05/16/2024 1038   HGBUR NEGATIVE 05/16/2024 1038   BILIRUBINUR NEGATIVE 05/16/2024 1038   BILIRUBINUR positive 07/11/2023 1453   BILIRUBINUR Negative 07/22/2022 1513   KETONESUR NEGATIVE 05/16/2024 1038   PROTEINUR >=300 (A) 05/16/2024 1038   UROBILINOGEN 1.0 07/11/2023 1453   UROBILINOGEN 0.2 03/15/2015 2151   NITRITE NEGATIVE  05/16/2024 1038   LEUKOCYTESUR NEGATIVE 05/16/2024 1038   Sepsis Labs Recent Labs  Lab 05/19/24 0532 05/21/24 0512 05/22/24 0614  WBC 6.6 7.1 7.1   Microbiology No results found for this or any previous visit (from the past 240 hours).   Time coordinating discharge: Over 30 minutes  SIGNED:   Darcel Dawley, MD  [facility] 05/24/2024, 2:18 PM Pager   If 7PM-7AM, please contact night-coverage

## 2024-05-24 NOTE — Progress Notes (Signed)
 START ON PATHWAY REGIMEN - Multiple Myeloma and Other Plasma Cell Dyscrasias     Cycles 1 and 2: A cycle is every 28 days:     Cyclophosphamide      Dexamethasone       Daratumumab and hyaluronidase-fihj      Bortezomib    Cycles 3 through 6: A cycle is every 28 days:     Cyclophosphamide      Dexamethasone       Dexamethasone       Daratumumab and hyaluronidase-fihj      Bortezomib    Cycles 7 and beyond (up to 2 years): A cycle is every 28 days:     Daratumumab and hyaluronidase-fihj   **Always confirm dose/schedule in your pharmacy ordering system**  Patient Characteristics: Primary AL Amyloidosis, First Line, Transplant Ineligible or Deferred Disease Classification: Primary AL Amyloidosis Line of therapy: First Line Is Patient Eligible for Transplant<= Transplant Ineligible or Deferred Intent of Therapy: Non-Curative / Palliative Intent, Discussed with Patient

## 2024-05-24 NOTE — Telephone Encounter (Signed)
 Left message to call back.

## 2024-05-24 NOTE — Discharge Instructions (Signed)
 Advised to follow-up with primary care physician in 1 week. Advised to follow-up with oncology Dr. Timmy as scheduled. Awaiting bone marrow biopsy result for initiation of treatment.

## 2024-05-24 NOTE — Progress Notes (Signed)
 SATURATION QUALIFICATIONS: (This note is used to comply with regulatory documentation for home oxygen )  Patient Saturations on Room Air at Rest = 94%  Patient Saturations on Room Air while Ambulating = 92%  Patient Saturations on 2 Liters of oxygen  while Ambulating = 94%  Please briefly explain why patient needs home oxygen :

## 2024-05-24 NOTE — Telephone Encounter (Signed)
 Spoke with daughter regarding recent hospitalization  Patient going home today on oxygen  Daughter wants to know if Dr Waddell is still going to want to do ablation on 9/3 Currently the daughter is still not feeling well   Will forward to Dr Waddell and Namon PARAS RN   Ok to call daughter at work number

## 2024-05-24 NOTE — Progress Notes (Signed)
 She is resting comfortably this morning.  Her breathing seems to be doing better.  It looks like from her chest x-ray that she had that she may have a little bit of volume overload.  I still do not have the results back from the bone marrow biopsy.  I still do not have the results back from her 24-hour urine for light chains.  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  She does have decreased immunoglobulins.  Her IgG is 395 mg/dL.  She does have an elevated troponin I.  Again, it would not surprise me if she had some cardiac involvement.  We can certainly do an outpatient treatment on her.  I think she would be able to tolerate treatment.  I probably would just use Faspro and Velcade.  These are both subcu injections.  I would think that they would have a fairly high chance of helping.  Again it would really be nice to get back the bone marrow and the 24-hour urine.  I am not sure when she is going to be discharged.  When she is discharged, then I can work on her orders and get her into the office for treatment.  Jeralyn Crease, MD  1Timothy 1:12

## 2024-05-25 ENCOUNTER — Telehealth: Payer: Self-pay

## 2024-05-25 LAB — SURGICAL PATHOLOGY

## 2024-05-25 NOTE — Patient Instructions (Addendum)
 Visit Information  Thank you for taking time to visit with me today. Please don't hesitate to contact me if I can be of assistance to you before our next scheduled telephone appointment.  Our next appointment is by telephone on 06/01/24 at 1:00 PM with Alan Ee, RN (scheduled)  Following is a copy of your care plan:   Goals Addressed             This Visit's Progress    VBCI Transitions of Care (TOC) Care Plan       Problems:Reactivated goal  Discharged on 05/24/24 Readmission for CHF.  04/26/2024  Patient reports that she continues to have a lot of swelling in her legs. Reports that she follows her low salt diet and takes her medications as prescribed. Reports she wears her compression hose ( only 1 leg due to wound). Reports that she keeps her legs elevated. 05/07/2024  Reports weight is 121-122 pounds.  Continues to elevate legs.  Bilateral Leg Swelling: 04/26/2024  Per EMR patient is expected to have refractory edema. 05/07/2024 Reports that she continues to have severe swelling.  Saw cardiology last week and was referred to nephrology.  Protein malnutrition:  04/26/2024  Patient reports that she has lost 35 pounds since January, due to decrease appetite after pacemaker insertion. 05/07/2024  Reports food makes her sick to her stomach. Drinking ensure and boost, fruit and yogurt.  Wound on leg- 04/26/2024 old wound on lower leg after hitting leg on dishwasher. Pending follow up with ortho in 1 week. 05/07/2024  Wound is healed per patient.  05/25/24 Pleural effusion due to CHF (congestive heart failure) (HCC) 05/14/2024 - 05/24/2024 05/25/24 Patient expressed concerns that she had not received oxygen  that the hospital doctor told her she would need.  Checked documents reviewed that prior to discharge she was 92% on RA ambulation and 94% on 02 at 2L per test done 05/24/24 per RN notes at the hospital.  Explained that oxygen  is usually order for 88% or lower. She verbalized understanding however  encouraged her to follow up with PCP.  She states she will make her own appointment after reviewing the appointments scheduled for her and her husband.  She is awaiting HH to call and noted with [facility] per inpatient Case Management had accepted the referral. 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 Timmy)   Goal:  Over the next 30 days, the patient will not experience hospital readmission  Interventions:  Transitions of Care:   Encouraged patient to continue to weigh daily, follow her low salt diet, elevate legs and wear her compression hose.   Continue to take medications as prescribed.  Provided a listening ear and offered support and reassurance Reviewed suggestion of drinking ensure between meals. Encouraged patient to eat more often.  Reviewed protein options. Encouraged patient to keep eating and drinking  Reviewed how to reach medical attention 24/7. Reminded patient that MD has someone on call if she needs assistance Follow up with providers as suggested Reviewed low salt diet Provided patient with phone number and address for [facility]- referral placed on 05/03/2024 05/25/24 Encouraged daily weights, and BP monitoring daily, verbalized understanding states she had company this morning and she fixed breakfast and then got this call but states she monitors it regularly.    Patient Self Care Activities:  Attend all scheduled provider appointments Call pharmacy for medication refills 3-7 days in advance of running  out of medications Call provider office for new concerns or questions  Notify RN Care Manager of TOC call rescheduling needs Participate in Transition of Care Program/Attend TOC scheduled calls Perform all self care activities independently  Perform IADL's (shopping, preparing meals, housekeeping, managing finances) independently Take medications as prescribed   Continue to weigh daily and  follow your low salt diet. Continues to elevate your legs and wear your compression hose. 05/25/24 reiterated goals  Call MD for weight gain.  Call MD's offices and schedule your follow up appointments. Call me if you are having difficulty with this. Continue to Observe wound for signs of infections now that they are healed. Try relaxation techniques to help with feeling overwhelmed.  Eat more protein Be careful not to fall on the blood thinners. If  you fall and hit your head- seek medical attention.  Placed a follow up call to [facility] if you do not hear from them in a few days. ( Phone number provided)  Plan: Next appointment for transition of care with Alan Ee in 1 week 06/01/24 at 1:00 PM Patient provided with my contact information to call me if needed. Reminded patient to follow up with PCP for hospital follow up.        The patient verbalized understanding of instructions, educational materials, and care plan provided today and DECLINED offer to receive copy of patient instructions, educational materials, and care plan. Patient verbalized understanding for follow up and goals using the Teach-back method, she will give her daughter the OR code or phone number for MyChart but felt she didn't need anything mailed at this time.  The patient has been provided with contact information for the care management team and has been advised to call with any health related questions or concerns.  The care management team will reach out to the patient again over the next 5-7 business days.  The patient will call provider* as advised to post hospital follow up.   Please call the care guide team at [phone] if you need to cancel or reschedule your appointment.   Please call the [place]  National Suicide Prevention Lifeline: 318-688-2509 or TTY: 226-097-2388 TTY 516-387-7479) to talk to a trained counselor call 1-800-273-TALK (toll free, 24 hour hotline) if you are experiencing a Mental  Health or Behavioral Health Crisis or need someone to talk to.   Richerd Fish, RN, BSN, CCM [facility], [facility] RN Care Manager Direct Dial: [phone]

## 2024-05-25 NOTE — Transitions of Care (Post Inpatient/ED Visit) (Addendum)
 05/25/2024  Name: Catherine [name] MRN: [id number] DOB: [birth year]  Today's TOC FU Call Status: Today's TOC FU Call Status:: Successful TOC FU Call Completed TOC FU Call Complete Date: 05/24/24 Patient's Name and Date of Birth confirmed.  Transition Care Management Follow-up Telephone Call Discharge Facility: [facility]) Type of Discharge: Inpatient Admission How have you been since you were released from the hospital?: Same Any questions or concerns?: Yes Patient Questions/Concerns:: Oxygen   Items Reviewed: Did you receive and understand the discharge instructions provided?: Yes Medications obtained,verified, and reconciled?: Yes (Medications Reviewed) Any new allergies since your discharge?: No Dietary orders reviewed?: Yes Type of Diet Ordered:: Heart Healthy Do you have support at home?: Yes People in Home [RPT]: spouse Name of Support/Comfort Primary Source: Catherine [name]; Catherine [name] supportive  Medications Reviewed Today: Medications Reviewed Today     Reviewed by Eilleen Richerd GRADE, RN (Registered Nurse) on 05/25/24 at [phone]  Med List Status: <None>   Medication Order Taking? Sig Documenting Provider Last Dose Status Informant  acetaminophen  (TYLENOL ) 500 MG tablet [id number] Yes Take 500 mg by mouth every 6 (six) hours as needed for mild pain (pain score 1-3). [provider]  Active Self, Pharmacy Records           Med Note (COFFELL, JON CHRISTELLA   Sun Mar 25, 2024 11:26 AM)    albuterol  (PROVENTIL ) (2.5 MG/3ML) 0.083% nebulizer solution [id number] Yes Take 3 mLs (2.5 mg total) by nebulization every 6 (six) hours as needed (during COPD Exacerbation). Hope Almarie ORN, NP  Active Self, Pharmacy Records  albuterol  (VENTOLIN  HFA) 108 [phone] Base) MCG/ACT inhaler [id number] Yes INHALE 2 PUFFS INTO THE LUNGS EVERY 4 HOURS AS NEEDED FOR AHEEZING OR SHORTNESS OF BREATH Parrett, Madelin RAMAN, NP  Active Self, Pharmacy Records           Med Note EFRAIM ALFREIDA LITTIE Pablo May 14, 2024   8:12 PM) LF: 02/21/24 for a 30ds  apixaban  (ELIQUIS ) 2.5 MG TABS tablet [id number] Yes TAKE ONE TABLET TWICE DAILY (START 09/18/23 OR AS DIRECTED) Micheal Wolm ORN, MD  Active Self, Pharmacy Records           Med Note EFRAIM, TYELISHA L   Mon May 14, 2024  8:10 PM) LF: 04/21/24 for a 30ds  bumetanide  (BUMEX ) 1 MG tablet [id number] Yes Take 2 tablets (2 mg total) by mouth daily. Leotis Bogus, MD  Active   cholecalciferol (VITAMIN D3) 25 MCG (1000 UNIT) tablet [id number] Yes Take 1,000 Units by mouth in the morning. [provider]  Active Self, Pharmacy Records  diazepam  (VALIUM ) 5 MG tablet [id number] Yes TAKE ONE TABLET BY MOUTH AT BEDTIME Burchette, Wolm ORN, MD  Active Self, Pharmacy Records           Med Note EFRAIM ALFREIDA LITTIE Pablo May 14, 2024  6:13 PM) LF: 05/11/24 for a 30ds  empagliflozin  (JARDIANCE ) 10 MG TABS tablet [id number] Yes Take 1 tablet (10 mg total) by mouth daily. Swinyer, Rosaline CHRISTELLA, NP  Active Self, Pharmacy Records           Med Note EFRAIM ALFREIDA LITTIE Pablo May 14, 2024  6:13 PM) LF: 05/09/24 for a 90ds  estradiol  (ESTRACE ) 0.1 MG/GM vaginal cream [id number] Yes Place 1 Applicatorful vaginally See admin instructions. Apply a small amount vaginally three nights a week on Monday, Wednesday and Friday. [provider]  Active Self, Pharmacy Records           Med  Note EFRAIM ALFREIDA LITTIE Pablo May 14, 2024  8:11 PM) LF: 03/15/24 for a 90ds  feeding supplement (ENSURE PLUS HIGH PROTEIN) LIQD [id number] Yes Take 237 mLs by mouth 2 (two) times daily between meals. Arrien, Elidia Sieving, MD  Active Self, Pharmacy Records  guaiFENesin  (MUCINEX ) 600 MG 12 hr tablet [id number] Yes Take 1 tablet (600 mg total) by mouth 2 (two) times daily as needed for to loosen phlegm or cough. Arrien, Elidia Sieving, MD  Active Self, Pharmacy Records  HYDROcodone -acetaminophen  (NORCO/VICODIN) 5-325 MG tablet [id number] Yes TAKE ONE TABLET EVERY 8 HOURS AS NEEDED FOR PAIN  Burchette, Wolm ORN, MD  Active Self, Pharmacy Records           Med Note EFRAIM ALFREIDA LITTIE Pablo May 14, 2024  6:13 PM) LF: 05/12/24 for a 30ds  midodrine  (PROAMATINE ) 10 MG tablet [id number] Yes Take 1 tablet (10 mg total) by mouth 3 (three) times daily with meals. Leotis Bogus, MD  Active   montelukast  (SINGULAIR ) 5 MG chewable tablet [id number] Yes Chew 1 tablet (5 mg total) by mouth at bedtime. Leotis Bogus, MD  Active   Multiple Vitamins-Minerals (CERTAVITE/ANTIOXIDANTS) TABS [id number] Yes Take 1 tablet by mouth daily. [provider]  Active Self, Pharmacy Records  ondansetron  (ZOFRAN ) 4 MG tablet [id number] Yes Take 1 tablet (4 mg total) by mouth every 8 (eight) hours as needed for nausea or vomiting. Arrien, Elidia Sieving, MD  Active Self, Pharmacy Records           Med Note EFRAIM, ALFREIDA LITTIE Pablo May 14, 2024  8:43 PM) Patient ran out of this medication and needs refills. Was unable to get more  pregabalin  (LYRICA ) 100 MG capsule [id number] Yes Take 1 capsule (100 mg total) by mouth at bedtime for 15 days. Leotis Bogus, MD  Active   sodium chloride  (OCEAN) 0.65 % SOLN nasal spray [id number] Yes Place 1 spray into both nostrils as needed for congestion. [provider]  Active Self, Pharmacy Records  Zinc  Sulfate 220 (50 Zn) MG TABS [id number] Yes Take 1 tablet (220 mg total) by mouth daily. Leotis Bogus, MD  Active             Home Care and Equipment/Supplies: Were Home Health Services Ordered?: Yes Name of Home Health Agency:: [phone] Has Agency set up a time to come to your home?: No EMR reviewed for Home Health Orders: Orders present/patient has not received call (refer to CM for follow-up) Any new equipment or medical supplies ordered?:  (Patient believed she was supposed to get oxygen )  Functional Questionnaire: Do you need assistance with bathing/showering or dressing?: No Do you need assistance with meal preparation?: No Do you need  assistance with eating?: No Do you have difficulty maintaining continence: No Do you need assistance with getting out of bed/getting out of a chair/moving?: No Do you have difficulty managing or taking your medications?: Yes (My Catherine [name] set up my pills and changed things out for me because I was so wore out after getting home)  Follow up appointments reviewed: PCP Follow-up appointment confirmed?: No (Patient declines to have appointment made as she needs to check husband's schedule and prefers to call for appointment later today) MD Provider Line [phone] Given: No Specialist Hospital Follow-up appointment confirmed?: Yes Date of Specialist follow-up appointment?: 05/30/24 Follow-Up Specialty Provider:: Cardiology possible Ablation Do you need transportation to your follow-up appointment?: No Do you understand care options if your condition(s) worsen?: Yes-patient verbalized understanding  SDOH Interventions Today    Flowsheet Row Most Recent Value  SDOH Interventions   Food Insecurity Interventions Intervention Not Indicated  Housing Interventions Intervention Not Indicated  Transportation Interventions Intervention Not Indicated  Utilities Interventions Intervention Not Indicated    Goals Addressed             This Visit's Progress    VBCI Transitions of Care (TOC) Care Plan       Problems:Reactivated goal  Discharged on 05/24/24 Readmission for CHF.  04/26/2024  Patient reports that she continues to have a lot of swelling in her legs. Reports that she follows her low salt diet and takes her medications as prescribed. Reports she wears her compression hose ( only 1 leg due to wound). Reports that she keeps her legs elevated. 05/07/2024  Reports weight is 121-122 pounds.  Continues to elevate legs.  Bilateral Leg Swelling: 04/26/2024  Per EMR patient is expected to have refractory edema. 05/07/2024 Reports that she continues to have severe swelling.  Saw cardiology last  week and was referred to nephrology.  Protein malnutrition:  04/26/2024  Patient reports that she has lost 35 pounds since January, due to decrease appetite after pacemaker insertion. 05/07/2024  Reports food makes her sick to her stomach. Drinking ensure and boost, fruit and yogurt.  Wound on leg- 04/26/2024 old wound on lower leg after hitting leg on dishwasher. Pending follow up with ortho in 1 week. 05/07/2024  Wound is healed per patient.  05/25/24 Pleural effusion due to CHF (congestive heart failure) (HCC) 05/14/2024 - 05/24/2024 05/25/24 Patient expressed concerns that she had not received oxygen  that the hospital doctor told her she would need.  Checked documents reviewed that prior to discharge she was 92% on RA ambulation and 94% on 02 at 2L per test done 05/24/24 per RN notes at the hospital.  Explained that oxygen  is usually order for 88% or lower. She verbalized understanding however encouraged her to follow up with PCP.  She states she Catherine [name] make her own appointment after reviewing the appointments scheduled for her and her husband.  She is awaiting HH to call and noted with [facility] per inpatient Case Management had accepted the referral. 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 Timmy)   Goal:  Over the next 30 days, the patient Catherine [name] not experience hospital readmission  Interventions:  Transitions of Care:   Encouraged patient to continue to weigh daily, follow her low salt diet, elevate legs and wear her compression hose.   Continue to take medications as prescribed.  Provided a listening ear and offered support and reassurance Reviewed suggestion of drinking ensure between meals. Encouraged patient to eat more often.  Reviewed protein options. Encouraged patient to keep eating and drinking  Reviewed how to reach medical attention 24/7. Reminded patient that MD has someone on call if she needs assistance Follow  up with providers as suggested Reviewed low salt diet Provided patient with phone number and address for [facility]- referral placed on 05/03/2024 05/25/24 Encouraged daily weights, and BP monitoring daily, verbalized understanding states she had company this morning and she fixed breakfast and then got this call but states she monitors it regularly.    Patient Self Care Activities:  Attend all scheduled provider appointments Call pharmacy for medication refills 3-7 days in advance of running out of medications Call provider office for new concerns or questions  Notify RN Care Manager of  TOC call rescheduling needs Participate in Transition of Care Program/Attend TOC scheduled calls Perform all self care activities independently  Perform IADL's (shopping, preparing meals, housekeeping, managing finances) independently Take medications as prescribed   Continue to weigh daily and follow your low salt diet. Continues to elevate your legs and wear your compression hose. 05/25/24 reiterated goals  Call MD for weight gain.  Call MD's offices and schedule your follow up appointments. Call me if you are having difficulty with this. Continue to Observe wound for signs of infections now that they are healed. Try relaxation techniques to help with feeling overwhelmed.  Eat more protein Be careful not to fall on the blood thinners. If  you fall and hit your head- seek medical attention.  Placed a follow up call to [facility] if you do not hear from them in a few days. ( Phone number provided)  Plan: Next appointment for transition of care with Alan Ee in 1 week 06/01/24 at 1:00 PM Patient provided with my contact information to call me if needed. Reminded patient to follow up with PCP for hospital follow up.        1630 Spoke with Harlene at [facility]  [phone] regarding HH this weekend and also called and spoke with Amy, [phone], that the referral and plans for [facility]  has been accepted and planned on.   Richerd Fish, RN, BSN, CCM [facility], [facility] RN Care Manager Direct Dial: [phone]

## 2024-05-29 ENCOUNTER — Encounter: Payer: Self-pay | Admitting: *Deleted

## 2024-05-29 ENCOUNTER — Ambulatory Visit: Payer: Self-pay

## 2024-05-29 NOTE — Telephone Encounter (Signed)
 FYI Only or Action Required?: FYI only for provider.  Patient was last seen in primary care on 04/30/2024 by Micheal Wolm ORN, MD.  Called Nurse Triage reporting hematoma.  Symptoms began today.  Interventions attempted: Prescription medications: eliquis .  Symptoms are: rapidly worsening.  Triage Disposition: Go to ED Now (or PCP Triage)  Patient/caregiver understands and will follow disposition?: Unsure     Copied from CRM #[id number]. Topic: Clinical - Red Word Triage >> May 29, 2024  1:22 PM Drema MATSU wrote: [facility] that prompted transfer to Nurse Triage: Tully was out to see patient for home health when she discovered that patient has a big blue knot on the back of leg. Patient stated that she is in pain from the knot. Tully states it almost looks like a hematoma and a lot of broken vessels in her chest area. Reason for Disposition  [1] Bruise on head, face, chest, or abdomen AND [2] taking Coumadin  (warfarin) or other strong blood thinner, or known bleeding disorder (e.g., thrombocytopenia)  Answer Assessment - Initial Assessment Questions 1. APPEARANCE of BRUISE: Describe the bruise.      States looks like thrombus 2. SIZE: How large is the bruise?      Huge knot 3. NUMBER: How many bruises are there?      one 4. LOCATION: Where is the bruise located?      Knot on back of left leg, states look like a hematoma or thrombus 5. ONSET: How long ago did the bruise occur?      Home health nurse seen it today during a start of care 6. CAUSE: What do you think caused the bruise?     Unknown, hx on eliquis  7. MEDICAL HISTORY: Do you have any medical problems that can cause easy bruising or bleeding? (e.g., leukemia, liver disease, recent chemotherapy)     On eliquis  8. MEDICINES: Do you take any medicines which thin the blood such as: aspirin , apixaban , heparin , ibuprofen (NSAIDS), Plavix, or Coumadin ?     eliquis  9. OTHER SYMPTOMS: Do you have any other  symptoms?  (e.g., weakness, dizziness, pain, fever, nosebleed, blood in urine/stool)     Small red spots to neck states looks like petechiae 10. PREGNANCY: Is there any chance you are pregnant? When was your last menstrual period?       na  Protocols used: [employer]

## 2024-05-29 NOTE — Telephone Encounter (Signed)
 Spoke with patient's daughter Catherine [name] on file. Patient was admitted in hospital from 8/18- 05/24/24. Catherine reports patient is currently using 4-5 breathing treatments daily and feels it is best to postpone SVT ablation ffor tomorrow until after patient follows up with Dr. Micheal on 9/5 and Dr. Raford on 9/8. Catherine will call back to reschedule based on provider recommendations at that time.

## 2024-05-29 NOTE — Progress Notes (Signed)
 Dr Timmy saw this patient in the hospital. He would like to see her ASAP in the office to discuss treatment options. Message sent to scheduling. Scheduling called patient and she was unwilling to schedule an appointment. Scheduling was able to get patient to schedule for next Tuesday.   Oncology Nurse Navigator Documentation     05/29/2024    3:00 PM  Oncology Nurse Navigator Flowsheets  Abnormal Finding Date 04/23/2024  Confirmed Diagnosis Date 05/21/2024  Diagnosis Status Confirmed Diagnosis Complete  Navigator Follow Up Date: 06/05/2024  Navigator Follow Up Reason: New Patient Appointment  Navigator Location [facility]  Navigator Encounter Type Introductory Phone Call  Patient Visit Type MedOnc  Treatment Phase Pre-Tx/Tx Discussion  Barriers/Navigation Needs Coordination of Care  Interventions Coordination of Care  Acuity Level 2-Minimal Needs (1-2 Barriers Identified)  Coordination of Care Appts  Support Groups/Services Friends and Family  Time Spent with Patient 30

## 2024-05-30 ENCOUNTER — Ambulatory Visit: Payer: Self-pay

## 2024-05-30 ENCOUNTER — Encounter (HOSPITAL_COMMUNITY): Admission: RE | Payer: Self-pay | Source: Home / Self Care

## 2024-05-30 ENCOUNTER — Other Ambulatory Visit: Payer: Self-pay

## 2024-05-30 ENCOUNTER — Ambulatory Visit (HOSPITAL_COMMUNITY): Admission: RE | Admit: 2024-05-30 | Source: Home / Self Care | Admitting: Internal Medicine

## 2024-05-30 ENCOUNTER — Emergency Department (HOSPITAL_BASED_OUTPATIENT_CLINIC_OR_DEPARTMENT_OTHER)
Admission: EM | Admit: 2024-05-30 | Discharge: 2024-05-30 | Disposition: A | Discharge status: Home / Self Care | Attending: Emergency Medicine | Admitting: Emergency Medicine

## 2024-05-30 DIAGNOSIS — Z7901 Long term (current) use of anticoagulants: Secondary | ICD-10-CM | POA: Insufficient documentation

## 2024-05-30 DIAGNOSIS — M7981 Nontraumatic hematoma of soft tissue: Secondary | ICD-10-CM | POA: Insufficient documentation

## 2024-05-30 DIAGNOSIS — S8012XA Contusion of left lower leg, initial encounter: Secondary | ICD-10-CM | POA: Diagnosis not present

## 2024-05-30 DIAGNOSIS — T148XXA Other injury of unspecified body region, initial encounter: Secondary | ICD-10-CM

## 2024-05-30 SURGERY — SVT ABLATION
Anesthesia: General

## 2024-05-30 NOTE — Telephone Encounter (Signed)
 FYI Only or Action Required?: Action required by provider: Advised ED.  Patient was last seen in primary care on 04/30/2024 by Micheal Wolm ORN, MD.  Called Nurse Triage reporting Leg Pain.  Symptoms began several days ago.  Interventions attempted: OTC medications: neosporin.  Symptoms are: gradually worsening.  Triage Disposition: See Physician Within 24 Hours  Patient/caregiver understands and will follow disposition?: Yes        Copied from CRM #[id number]. Topic: Clinical - Red Word Triage >> May 30, 2024  3:54 PM Deleta RAMAN wrote: Red Word that prompted transfer to Nurse Triage: patient states she has leg pain. Recently release from hospital on last Wednesday. Has a an appointment with pcp wants to be seen sooner due to pain. States her legs are bloody and has cuts Reason for Disposition  Localized pain, redness or hard lump along vein    No available appts today in clinic, advised ED  Answer Assessment - Initial Assessment Questions Advised ED. Patient reports will go to ED by house.  1. ONSET: When did the pain start?      Past weekend, kept feeling it 2. LOCATION: Where is the pain located?      Left leg, behind knee, looks like a hematoma, painful; Size of golfball.  3. PAIN: How bad is the pain?    (Scale 1-10; or mild, moderate, severe)     10/10 Painful to walk, sitting with feet up no pain, but sitting and walking has pain, afraid it's going. Applied neosporin, dark red like a bruise, not hot to touch, feels like my other skin. Both legs swelling. It moveable when palpating. Denies red streaking, bleeding or break in skin; husband reports blue and black around it. Patient reports pain in calf muscle, when walking.   5. CAUSE: What do you think is causing the leg pain?    Elastic from ted hose cut into my leg; from hospital 6. OTHER SYMPTOMS: Do you have any other symptoms? (e.g., chest pain, back pain, breathing difficulty, swelling, rash, fever, numbness,  weakness)     Reports diff breathing and reports takes prescribed breathing treatments, weakness for being in hospital for long time.  Denies fever, numbness Pt takes Eliquis   Protocols used: Leg Pain-A-AH

## 2024-05-30 NOTE — ED Provider Notes (Signed)
 Catherine [name] EMERGENCY DEPARTMENT AT [facility] Provider Note   CSN: [id number] Arrival date & time: 05/30/24  1708     Patient presents with: Leg Pain   Catherine [name] is a 89 y.o. female.   89 yo F with a chief complaints of a hematoma to the back of her leg.  She said this been going on for a few days.  Her home health nurse saw it and told her it might be a blood clot and she should come into the ED for evaluation.  Patient was just hospitalized for lower extremity edema she feels like it has been doing a bit better.  She was trying compression stockings and thinks maybe that caused her symptoms.   Leg Pain      Prior to Admission medications   Medication Sig Start Date End Date Taking? Authorizing Provider  acetaminophen  (TYLENOL ) 500 MG tablet Take 500 mg by mouth every 6 (six) hours as needed for mild pain (pain score 1-3).    [provider]  albuterol  (PROVENTIL ) (2.5 MG/3ML) 0.083% nebulizer solution Take 3 mLs (2.5 mg total) by nebulization every 6 (six) hours as needed (during COPD Exacerbation). 02/15/24 02/14/25  Hope Almarie ORN, NP  albuterol  (VENTOLIN  HFA) 108 [phone] Base) MCG/ACT inhaler INHALE 2 PUFFS INTO THE LUNGS EVERY 4 HOURS AS NEEDED FOR AHEEZING OR SHORTNESS OF BREATH 02/21/24   Parrett, Madelin RAMAN, NP  apixaban  (ELIQUIS ) 2.5 MG TABS tablet TAKE ONE TABLET TWICE DAILY (START 09/18/23 OR AS DIRECTED) 10/24/23   Burchette, Wolm ORN, MD  bumetanide  (BUMEX ) 1 MG tablet Take 2 tablets (2 mg total) by mouth daily. 05/25/24 06/24/24  Leotis Bogus, MD  cholecalciferol (VITAMIN D3) 25 MCG (1000 UNIT) tablet Take 1,000 Units by mouth in the morning.    [provider]  diazepam  (VALIUM ) 5 MG tablet TAKE ONE TABLET BY MOUTH AT BEDTIME 12/12/23   Burchette, Wolm ORN, MD  empagliflozin  (JARDIANCE ) 10 MG TABS tablet Take 1 tablet (10 mg total) by mouth daily. 05/09/24   Swinyer, Rosaline CHRISTELLA, NP  estradiol  (ESTRACE ) 0.1 MG/GM vaginal cream Place 1 Applicatorful  vaginally See admin instructions. Apply a small amount vaginally three nights a week on Monday, Wednesday and Friday.    [provider]  feeding supplement (ENSURE PLUS HIGH PROTEIN) LIQD Take 237 mLs by mouth 2 (two) times daily between meals. 04/25/24   Arrien, Mauricio Daniel, MD  guaiFENesin  (MUCINEX ) 600 MG 12 hr tablet Take 1 tablet (600 mg total) by mouth 2 (two) times daily as needed for to loosen phlegm or cough. 04/25/24   Arrien, Mauricio Daniel, MD  HYDROcodone -acetaminophen  (NORCO/VICODIN) 5-325 MG tablet TAKE ONE TABLET EVERY 8 HOURS AS NEEDED FOR PAIN 05/12/24   Burchette, Wolm ORN, MD  midodrine  (PROAMATINE ) 10 MG tablet Take 1 tablet (10 mg total) by mouth 3 (three) times daily with meals. 05/24/24 06/23/24  Leotis Bogus, MD  montelukast  (SINGULAIR ) 5 MG chewable tablet Chew 1 tablet (5 mg total) by mouth at bedtime. 05/24/24 06/23/24  Leotis Bogus, MD  Multiple Vitamins-Minerals (CERTAVITE/ANTIOXIDANTS) TABS Take 1 tablet by mouth daily.    [provider]  ondansetron  (ZOFRAN ) 4 MG tablet Take 1 tablet (4 mg total) by mouth every 8 (eight) hours as needed for nausea or vomiting. 04/25/24   Arrien, Elidia Sieving, MD  pregabalin  (LYRICA ) 100 MG capsule Take 1 capsule (100 mg total) by mouth at bedtime for 15 days. 05/24/24 06/08/24  Leotis Bogus, MD  sodium chloride  (OCEAN) 0.65 %  SOLN nasal spray Place 1 spray into both nostrils as needed for congestion.    [provider]  Zinc  Sulfate 220 (50 Zn) MG TABS Take 1 tablet (220 mg total) by mouth daily. 05/25/24 06/24/24  Leotis Bogus, MD    Allergies: Aldactone  [spironolactone ], Amoxil  Masen Luallen.Croak ], Zebeta  [bisoprolol ], Calan  [place] ], Crestor  [rosuvastatin ], Flovent  [fluticasone  propionate], Livalo  [pitavastatin ], Lyrica  Emely.Eng ], Neurontin [gabapentin], Norvasc  [amlodipine ], Zetia  [ezetimibe ], Zocor  [simvastatin ], Lotensin [benazepril], and Polytrim  [polymyxin b -trimethoprim ]    Review of  Systems  Updated Vital Signs BP (!) 111/50   Pulse 62   Temp 98.1 F (36.7 C)   Resp 16   SpO2 96%   Physical Exam Vitals and nursing note reviewed.  Constitutional:      General: She is not in acute distress.    Appearance: She is well-developed. She is not diaphoretic.  HENT:     Head: Normocephalic and atraumatic.  Eyes:     Pupils: Pupils are equal, round, and reactive to light.  Cardiovascular:     Rate and Rhythm: Normal rate and regular rhythm.     Heart sounds: No murmur heard.    No friction rub. No gallop.  Pulmonary:     Effort: Pulmonary effort is normal.     Breath sounds: No wheezing or rales.  Abdominal:     General: There is no distension.     Palpations: Abdomen is soft.     Tenderness: There is no abdominal tenderness.  Musculoskeletal:        General: No tenderness.     Cervical back: Normal range of motion and neck supple.     Comments: Blister to the lateral aspects of the left posterior knee, filled with blood.  Not tense.  Pulse motor and sensation intact distally.  Bilateral lower extremities with significant edema chronic skin changes.  Skin:    General: Skin is warm and dry.  Neurological:     Mental Status: She is alert and oriented to person, place, and time.  Psychiatric:        Behavior: Behavior normal.     (all labs ordered are listed, but only abnormal results are displayed) Labs Reviewed - No data to display  EKG: None  Radiology: No results found.   Procedures   Medications Ordered in the ED - No data to display                                  Medical Decision Making  89 yo F with a chief complaint of developing a blister to the posterior aspect of her left knee with wearing compression stockings.  Family has self discontinued compression stockings. Patient has a hematoma.  This should improve on its own.    I discussed this with patient and family.  Will trial Ace wrap for comfort.  PCP follow-up.  7:59 PM:  I have  discussed the diagnosis/risks/treatment options with the patient and family.  Evaluation and diagnostic testing in the emergency department does not suggest an emergent condition requiring admission or immediate intervention beyond what has been performed at this time.  They will follow up with PCP. We also discussed returning to the ED immediately if new or worsening sx occur. We discussed the sx which are most concerning (e.g., sudden worsening pain, fever, inability to tolerate by mouth) that necessitate immediate return. Medications administered to the patient during their visit and any new prescriptions provided to the patient  are listed below.  Medications given during this visit Medications - No data to display   The patient appears reasonably screen and/or stabilized for discharge and I doubt any other medical condition or other [facility] requiring further screening, evaluation, or treatment in the ED at this time prior to discharge.      Final diagnoses:  Hematoma    ED Discharge Orders     None          Emil Share, [place] 05/30/24 1959

## 2024-05-30 NOTE — Discharge Instructions (Signed)
 Try an ace wrap and see how you do.  If it is irritating then you can take it off.  This certainly might rupture on its own.  If it does you can apply an ointment a couple times a day this could be Vaseline or an antibiotic ointment if you so choose and then keep it covered.  Please let your family doctor know that you are having trouble with your compression socks and see what they can do to help.

## 2024-05-30 NOTE — Telephone Encounter (Signed)
 Left detailed message on patient home phone informing her of the message below. I also attempted to contact patient's mobile phone with no success.

## 2024-05-30 NOTE — ED Triage Notes (Addendum)
 Recently dc from hospital. Patient states she tried to wear compression socks for her edema and now has a hematoma behind her left knee where the socks hit

## 2024-05-31 ENCOUNTER — Encounter (HOSPITAL_COMMUNITY): Payer: Self-pay

## 2024-05-31 NOTE — Progress Notes (Signed)
 Remote pacemaker transmission.

## 2024-06-01 ENCOUNTER — Other Ambulatory Visit: Payer: Self-pay

## 2024-06-01 ENCOUNTER — Encounter (HOSPITAL_COMMUNITY): Payer: Self-pay

## 2024-06-01 ENCOUNTER — Encounter: Payer: Self-pay | Admitting: Family Medicine

## 2024-06-01 ENCOUNTER — Ambulatory Visit (INDEPENDENT_AMBULATORY_CARE_PROVIDER_SITE_OTHER): Admitting: Family Medicine

## 2024-06-01 VITALS — BP 132/78 | HR 81 | Temp 98.2°F | Wt 140.2 lb

## 2024-06-01 DIAGNOSIS — Z23 Encounter for immunization: Secondary | ICD-10-CM

## 2024-06-01 DIAGNOSIS — I5033 Acute on chronic diastolic (congestive) heart failure: Secondary | ICD-10-CM | POA: Diagnosis not present

## 2024-06-01 DIAGNOSIS — R600 Localized edema: Secondary | ICD-10-CM

## 2024-06-01 DIAGNOSIS — C9000 Multiple myeloma not having achieved remission: Secondary | ICD-10-CM | POA: Diagnosis not present

## 2024-06-01 DIAGNOSIS — I482 Chronic atrial fibrillation, unspecified: Secondary | ICD-10-CM

## 2024-06-01 MED ORDER — ONDANSETRON HCL 4 MG PO TABS: 4.0000 mg | ORAL_TABLET | Freq: Three times a day (TID) | ORAL | 0 refills | Status: DC | PRN

## 2024-06-01 MED ORDER — EMPAGLIFLOZIN 10 MG PO TABS: 10.0000 mg | ORAL_TABLET | Freq: Every day | ORAL | 3 refills | Status: DC

## 2024-06-01 NOTE — Patient Instructions (Signed)
 Visit Information  Thank you for taking time to visit with me today. Please don't hesitate to contact me if I can be of assistance to you before our next scheduled telephone appointment.  Our next appointment is by telephone on 06/06/2024 at 1045  Following is a copy of your care plan:   Goals Addressed             This Visit's Progress    [facility] Transitions of Care (TOC) Care Plan       Problems:Reactivated goal  Discharged on 05/24/24 Readmission for CHF.  04/26/2024  Patient reports that she continues to have a lot of swelling in her legs. Reports that she follows her low salt diet and takes her medications as prescribed. Reports she wears her compression hose ( only 1 leg due to wound). Reports that she keeps her legs elevated. 05/07/2024  Reports weight is 121-122 pounds.  Continues to elevate legs. 06/01/2024  Patient reports that she has been gaining about 1 pound a day. ( Has gained 8 pounds since last call with TOC nurse) Reports swelling and hematoma to the left calf.  Reports worsening shortness of breath today.  Did not qualify for oxygen  in the hospital.  Bilateral Leg Swelling: 04/26/2024  Per EMR patient is expected to have refractory edema. 05/07/2024 Reports that she continues to have severe swelling.  Saw cardiology last week and was referred to nephrology. 06/01/2024  Reports increase in swelling of the legs.  Seen in the ED with a hematoma to the left calf ( no injury)  Patient reports that she took off her compression hose because her legs were turning blue because the compression hose were too tight.  Protein malnutrition:  04/26/2024  Patient reports that she has lost 35 pounds since January, due to decrease appetite after pacemaker insertion. 05/07/2024  Reports food makes her sick to her stomach. Drinking ensure and boost, fruit and yogurt.  06/01/2024  Reviewed low salt diet and importance of eating well.  Wound on leg- 04/26/2024 old wound on lower leg after hitting leg on  dishwasher. Pending follow up with ortho in 1 week. 05/07/2024  Wound is healed per patient. 06/01/2024 New hematoma and increased swelling to legs per patient report.  05/25/24 Pleural effusion due to CHF (congestive heart failure) (HCC) 05/14/2024 - 05/24/2024. 06/01/2024 Worsening shortness of breath. Pending office visit with PCP today 05/25/24 Patient expressed concerns that she had not received oxygen  that the hospital doctor told her she would need.  Checked documents reviewed that prior to discharge she was 92% on RA ambulation and 94% on 02 at 2L per test done 05/24/24 per RN notes at the hospital.  Explained that oxygen  is usually order for 88% or lower. She verbalized understanding however encouraged her to follow up with PCP.  She states she will make her own appointment after reviewing the appointments scheduled for her and her husband. 06/01/2024  Has PCP follow up today. Pulse reading at home during call of 94% She is awaiting HH to call and noted with [facility] per inpatient Case Management had accepted the referral. 06/01/2024 Active with home health. Saw RN this week.  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 Timmy).  06/01/2024  Pending oncology visit next week.    Goal:  Over the next 30 days, the patient will not experience hospital readmission  Interventions:  Transitions of Care:   Encouraged patient to  continue to weigh daily, follow her low salt diet, elevate legs and wear her compression hose.   Continue to take medications as prescribed.  Provided a listening ear and offered support and reassurance Reviewed suggestion of drinking ensure between meals. Encouraged patient to eat more often.  Reviewed protein options. Encouraged patient to keep eating and drinking  Reviewed how to reach medical attention 24/7. Reminded patient that MD has someone on call if she needs assistance Follow up with providers as  suggested Reviewed low salt diet Reviewed importance of discussing her concerns with MD today.  This note sent to PCP for review prior to this afternoons OV Inquired if patient felt like she needed to go to the hospital and she said NO. I do not want to go to the hospital. I will see my doctor this afternoon.   Patient Self Care Activities:  Attend all scheduled provider appointments Call pharmacy for medication refills 3-7 days in advance of running out of medications Call provider office for new concerns or questions  Notify RN Care Manager of TOC call rescheduling needs Participate in Transition of Care Program/Attend TOC scheduled calls Perform all self care activities independently  Perform IADL's (shopping, preparing meals, housekeeping, managing finances) independently Take medications as prescribed   Continue to weigh daily and follow your low salt diet. Continues to elevate your legs and wear your compression hose.  Call MD for weight gain.  Continue to Observe legs for signs of infection Try relaxation techniques to help with feeling overwhelmed.  Eat more protein Be careful not to fall on the blood thinners. If  you fall and hit your head- seek medical attention.  Go to your appointment with PCP today.  Plan: Next appointment for transition of care with Alan Ee in 1 week 06/06/24 at 10:445 am Patient provided with my contact information to call me if needed.         Patient verbalizes understanding of instructions and care plan provided today and agrees to view in MyChart. Active MyChart status and patient understanding of how to access instructions and care plan via MyChart confirmed with patient.     Telephone follow up appointment with care management team member scheduled for:  Please call the care guide team at [phone] if you need to cancel or reschedule your appointment.   Please call the Suicide and Crisis Lifeline: 988 call the [place]  National Suicide  Prevention Lifeline: 2364835851 or TTY: 907-816-2278 TTY 2085867763) to talk to a trained counselor call 1-800-273-TALK (toll free, 24 hour hotline) call 911 if you are experiencing a Mental Health or Behavioral Health Crisis or need someone to talk to.  Alan Ee, RN, BSN, CEN [employer]- Transition of Care Team.  [facility] [phone]

## 2024-06-01 NOTE — Transitions of Care (Post Inpatient/ED Visit) (Signed)
 Transition of Care week 2  Visit Note  06/01/2024  Name: Catherine [name] MRN: [id number]          DOB: 19-Oct-1934  Situation: Patient enrolled in [facility] 30-day program. Visit completed with pateint by telephone.   Background:   Initial Transition Care Management Follow-up Telephone Call    Past Medical History:  Diagnosis Date   AKI (acute kidney injury) (HCC) 06/22/2022   Amyloidosis (HCC) 05/24/2024   Aortic stenosis, mild 07/26/2017   Arthritis    Asthma    last attack 02/2015   Cellulitis of left lower extremity 08/17/2016   Ulcer associated with severe venous insufficiency   Chronic anticoagulation    Chronic diastolic CHF (congestive heart failure) (HCC)    Chronic kidney disease    RIGHT MANY KIDNEY INFECTIONS AND STONES   COPD (chronic obstructive pulmonary disease) (HCC)    Coronary artery disease    Dizziness    H/O: rheumatic fever    Hypertension    PAF (paroxysmal atrial fibrillation) (HCC)    PONV (postoperative nausea and vomiting)    ' SOMETIMES', BUT NOT ALWAYS   Primary hypertension    RHEUMATIC MITRAL STENOSIS 01/05/2008   Qualifier: Diagnosis of   By: Neysa MD, Clinton D        S/P Maze operation for atrial fibrillation 10/14/2016   Complete bilateral atrial lesion set using cryothermy and bipolar radiofrequency ablation - atrial appendage was not treated due to previous surgical procedure (open mitral commissurotomy)   S/P mitral valve replacement with bioprosthetic valve 10/14/2016   29 mm Medtronic Mosaic porcine bioprosthetic tissue valve   Sciatica 08/29/2023   Tricuspid regurgitation 07/14/2021   UTI (urinary tract infection)    Valvular heart disease    Has mitral stenosis with prior mitral commissurotomy in 1970    Assessment: Patient reports that she feels terrible today. Recent ED visit for hematoma to the left calf. Reports increase swelling and shortness of breath.  8 pound weight gain in 1 week.  Patient Reported Symptoms: Cognitive  Cognitive Status: Able to follow simple commands, Alert and oriented to person, place, and time, Normal speech and language skills      Neurological Neurological Review of Symptoms: No symptoms reported    HEENT HEENT Symptoms Reported: Other: HEENT Comment: reports seeing double in the left eye since hospital discharge.  wears readers    Cardiovascular Cardiovascular Symptoms Reported: Swelling in legs or feet, Other: Other Cardiovascular Symptoms: patient reports that she feels terrible today. worsening swelling in legs. increased shortness of breath Does patient have uncontrolled Hypertension?: No Is patient checking Blood Pressure at home?: Yes Patient's Recent BP reading at home: has not checked today,  yesterdays home BP reading118/52 Weight: 135 lb (61.2 kg) (home monitoring)  Respiratory Respiratory Symptoms Reported: Shortness of breath (patient sounds short of breath while on the phone with me today. however pulse ox is 94% on room air.) Other Respiratory Symptoms: worsening shortness of breath and swelling. Additional Respiratory Details: home pulse ox while on the phone is 94% on room air Respiratory Management Strategies: Routine screening Respiratory Self-Management Outcome: 4 (good)  Endocrine Endocrine Symptoms Reported: No symptoms reported    Gastrointestinal Gastrointestinal Symptoms Reported: No symptoms reported Additional Gastrointestinal Details: denies NVD . LBM 2 days ago Gastrointestinal Self-Management Outcome: 4 (good)    Genitourinary Genitourinary Symptoms Reported: No symptoms reported Additional Genitourinary Details: denies dysuria    Integumentary Integumentary Symptoms Reported: Other Other Integumentary Symptoms: left breast is better.  Seen  in the ED this week with a hematoma left calf. no injury.  Patient reports that the ED wrapped her leg and still has this on now. Will see PCP today    Musculoskeletal Musculoskelatal Symptoms Reviewed: Joint  pain Other Musculoskeletal Symptoms: chronic pain Musculoskeletal Management Strategies: Medication therapy Musculoskeletal Self-Management Outcome: 4 (good)      Psychosocial Psychosocial Symptoms Reported: No symptoms reported          Today's Vitals   06/01/24 1004 06/01/24 1007  SpO2:  94%  Weight: 135 lb (61.2 kg) 135 lb (61.2 kg)  PainSc:  8     Medications Reviewed Today     Reviewed by Rumalda Alan PENNER, RN (Registered Nurse) on 06/01/24 at [phone]  Med List Status: <None>   Medication Order Taking? Sig Documenting Provider Last Dose Status Informant  acetaminophen  (TYLENOL ) 500 MG tablet [id number] Yes Take 500 mg by mouth every 6 (six) hours as needed for mild pain (pain score 1-3). [provider]  Active Self, Pharmacy Records           Med Note (COFFELL, JON HERO   Sun Mar 25, 2024 11:26 AM)    albuterol  (PROVENTIL ) (2.5 MG/3ML) 0.083% nebulizer solution [id number] Yes Take 3 mLs (2.5 mg total) by nebulization every 6 (six) hours as needed (during COPD Exacerbation). Hope Almarie ORN, NP  Active Self, Pharmacy Records  albuterol  (VENTOLIN  HFA) 108 [phone] Base) MCG/ACT inhaler [id number] Yes INHALE 2 PUFFS INTO THE LUNGS EVERY 4 HOURS AS NEEDED FOR AHEEZING OR SHORTNESS OF BREATH Parrett, Madelin RAMAN, NP  Active Self, Pharmacy Records           Med Note EFRAIM ALFREIDA LITTIE Pablo May 14, 2024  8:12 PM) LF: 02/21/24 for a 30ds  apixaban  (ELIQUIS ) 2.5 MG TABS tablet [id number] Yes TAKE ONE TABLET TWICE DAILY (START 09/18/23 OR AS DIRECTED) Micheal Wolm ORN, MD  Active Self, Pharmacy Records           Med Note EFRAIM, TYELISHA L   Mon May 14, 2024  8:10 PM) LF: 04/21/24 for a 30ds  bumetanide  (BUMEX ) 1 MG tablet [id number] Yes Take 2 tablets (2 mg total) by mouth daily. Leotis Bogus, MD  Active   cholecalciferol (VITAMIN D3) 25 MCG (1000 UNIT) tablet [id number] Yes Take 1,000 Units by mouth in the morning. [provider]  Active Self, Pharmacy Records  diazepam   (VALIUM ) 5 MG tablet [id number] Yes TAKE ONE TABLET BY MOUTH AT BEDTIME Burchette, Wolm ORN, MD  Active Self, Pharmacy Records           Med Note EFRAIM ALFREIDA LITTIE Pablo May 14, 2024  6:13 PM) LF: 05/11/24 for a 30ds  empagliflozin  (JARDIANCE ) 10 MG TABS tablet [id number] Yes Take 1 tablet (10 mg total) by mouth daily. Swinyer, Rosaline HERO, NP  Active Self, Pharmacy Records           Med Note EFRAIM ALFREIDA LITTIE Pablo May 14, 2024  6:13 PM) LF: 05/09/24 for a 90ds  estradiol  (ESTRACE ) 0.1 MG/GM vaginal cream [id number] Yes Place 1 Applicatorful vaginally See admin instructions. Apply a small amount vaginally three nights a week on Monday, Wednesday and Friday. [provider]  Active Self, Pharmacy Records           Med Note EFRAIM ALFREIDA LITTIE Pablo May 14, 2024  8:11 PM) LF: 03/15/24 for a 90ds  feeding supplement (ENSURE PLUS HIGH PROTEIN) LIQD [id number] Yes Take 237 mLs  by mouth 2 (two) times daily between meals. Arrien, Elidia Sieving, MD  Active Self, Pharmacy Records  guaiFENesin  (MUCINEX ) 600 MG 12 hr tablet [id number] Yes Take 1 tablet (600 mg total) by mouth 2 (two) times daily as needed for to loosen phlegm or cough. Arrien, Elidia Sieving, MD  Active Self, Pharmacy Records  HYDROcodone -acetaminophen  (NORCO/VICODIN) 5-325 MG tablet [id number] Yes TAKE ONE TABLET EVERY 8 HOURS AS NEEDED FOR PAIN Burchette, Wolm ORN, MD  Active Self, Pharmacy Records           Med Note EFRAIM ALFREIDA LITTIE Pablo May 14, 2024  6:13 PM) LF: 05/12/24 for a 30ds  midodrine  (PROAMATINE ) 10 MG tablet [id number] Yes Take 1 tablet (10 mg total) by mouth 3 (three) times daily with meals. Leotis Bogus, MD  Active   montelukast  (SINGULAIR ) 5 MG chewable tablet [id number] Yes Chew 1 tablet (5 mg total) by mouth at bedtime. Leotis Bogus, MD  Active   Multiple Vitamins-Minerals (CERTAVITE/ANTIOXIDANTS) TABS [id number] Yes Take 1 tablet by mouth daily. [provider]  Active Self, Pharmacy Records   ondansetron  (ZOFRAN ) 4 MG tablet [id number] Yes Take 1 tablet (4 mg total) by mouth every 8 (eight) hours as needed for nausea or vomiting. Arrien, Elidia Sieving, MD  Active Self, Pharmacy Records           Med Note EFRAIM, ALFREIDA LITTIE Pablo May 14, 2024  8:43 PM) Patient ran out of this medication and needs refills. Was unable to get more  pregabalin  (LYRICA ) 100 MG capsule [id number] Yes Take 1 capsule (100 mg total) by mouth at bedtime for 15 days. Leotis Bogus, MD  Active   sodium chloride  (OCEAN) 0.65 % SOLN nasal spray [id number] Yes Place 1 spray into both nostrils as needed for congestion. [provider]  Active Self, Pharmacy Records  Zinc  Sulfate 220 (50 Zn) MG TABS [id number] Yes Take 1 tablet (220 mg total) by mouth daily. Leotis Bogus, MD  Active             Goals Addressed             This Visit's Progress    [facility] Transitions of Care (TOC) Care Plan       Problems:Reactivated goal  Discharged on 05/24/24 Readmission for CHF.  04/26/2024  Patient reports that she continues to have a lot of swelling in her legs. Reports that she follows her low salt diet and takes her medications as prescribed. Reports she wears her compression hose ( only 1 leg due to wound). Reports that she keeps her legs elevated. 05/07/2024  Reports weight is 121-122 pounds.  Continues to elevate legs. 06/01/2024  Patient reports that she has been gaining about 1 pound a day. ( Has gained 8 pounds since last call with TOC nurse) Reports swelling and hematoma to the left calf.  Reports worsening shortness of breath today.  Did not qualify for oxygen  in the hospital.  Bilateral Leg Swelling: 04/26/2024  Per EMR patient is expected to have refractory edema. 05/07/2024 Reports that she continues to have severe swelling.  Saw cardiology last week and was referred to nephrology. 06/01/2024  Reports increase in swelling of the legs.  Seen in the ED with a hematoma to the left calf ( no injury)  Patient  reports that she took off her compression hose because her legs were turning blue because the compression hose were too tight.  Protein malnutrition:  04/26/2024  Patient reports that she has lost  35 pounds since January, due to decrease appetite after pacemaker insertion. 05/07/2024  Reports food makes her sick to her stomach. Drinking ensure and boost, fruit and yogurt.  06/01/2024  Reviewed low salt diet and importance of eating well.  Wound on leg- 04/26/2024 old wound on lower leg after hitting leg on dishwasher. Pending follow up with ortho in 1 week. 05/07/2024  Wound is healed per patient. 06/01/2024 New hematoma and increased swelling to legs per patient report.  05/25/24 Pleural effusion due to CHF (congestive heart failure) (HCC) 05/14/2024 - 05/24/2024. 06/01/2024 Worsening shortness of breath. Pending office visit with PCP today 05/25/24 Patient expressed concerns that she had not received oxygen  that the hospital doctor told her she would need.  Checked documents reviewed that prior to discharge she was 92% on RA ambulation and 94% on 02 at 2L per test done 05/24/24 per RN notes at the hospital.  Explained that oxygen  is usually order for 88% or lower. She verbalized understanding however encouraged her to follow up with PCP.  She states she will make her own appointment after reviewing the appointments scheduled for her and her husband. 06/01/2024  Has PCP follow up today. Pulse reading at home during call of 94% She is awaiting HH to call and noted with [facility] per inpatient Case Management had accepted the referral. 06/01/2024 Active with home health. Saw RN this week.  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 Timmy).  06/01/2024  Pending oncology visit next week.    Goal:  Over the next 30 days, the patient will not experience hospital readmission  Interventions:  Transitions of Care:   Encouraged patient to  continue to weigh daily, follow her low salt diet, elevate legs and wear her compression hose.   Continue to take medications as prescribed.  Provided a listening ear and offered support and reassurance Reviewed suggestion of drinking ensure between meals. Encouraged patient to eat more often.  Reviewed protein options. Encouraged patient to keep eating and drinking  Reviewed how to reach medical attention 24/7. Reminded patient that MD has someone on call if she needs assistance Follow up with providers as suggested Reviewed low salt diet Reviewed importance of discussing her concerns with MD today.  This note sent to PCP for review prior to this afternoons OV Inquired if patient felt like she needed to go to the hospital and she said NO. I do not want to go to the hospital. I will see my doctor this afternoon.   Patient Self Care Activities:  Attend all scheduled provider appointments Call pharmacy for medication refills 3-7 days in advance of running out of medications Call provider office for new concerns or questions  Notify RN Care Manager of TOC call rescheduling needs Participate in Transition of Care Program/Attend TOC scheduled calls Perform all self care activities independently  Perform IADL's (shopping, preparing meals, housekeeping, managing finances) independently Take medications as prescribed   Continue to weigh daily and follow your low salt diet. Continues to elevate your legs and wear your compression hose.  Call MD for weight gain.  Continue to Observe legs for signs of infection Try relaxation techniques to help with feeling overwhelmed.  Eat more protein Be careful not to fall on the blood thinners. If  you fall and hit your head- seek medical attention.  Go to your appointment with PCP today.  Plan: Next appointment for transition of care with [facility]  Rose in 1 week 06/06/24 at 10:445 am Patient provided with my contact information to call me if needed.          Recommendation:   See PCP today as planned. If you get worse go to the Emergency department.   Follow Up Plan:   Telephone follow up appointment date/time:  06/06/2024 at 1045  Alan Ee, RN, BSN, [employer]- Transition of Care Team.  [employer] [phone]

## 2024-06-01 NOTE — Patient Instructions (Addendum)
 Increase the Bumex  to two tablets daily until follow up with Dr Raford on Monday.   Get back on Jardiance  one daily- refill sent.

## 2024-06-01 NOTE — Progress Notes (Signed)
 Established Patient Office Visit  Subjective   Patient ID: Catherine [name], female    DOB: 1934/10/02  Age: 89 y.o. MRN: [id number]  Chief Complaint  Patient presents with   Hospitalization Follow-up    HPI   Patient has multiple chronic medical problems as below.  She has been battling for quite some time with congestive heart failure and severe peripheral edema with multiple recent admissions..  She is followed closely by cardiology.  She has had multiple hospitalizations including recent admission 8/18 through 8/28.  After recent hospital discharge she was wearing knee-high compression garments and apparently developed some blood blisters popliteal region bilaterally and this prompted recent ER visit on September 3.  She was told to discontinue knee-high compression garments and Ace wrap's were applied.  Recent admission for recurrent bilateral leg swelling and dyspnea.  Workup revealed proteinuria and she had subsequent biopsy confirming multiple myeloma.  Chronic atrial fibrillation.  She had been awaiting ablation procedure prior to her recent exacerbation.  She feels like her edema is really not much improved following recent hospitalization.  We reviewed her med list.  At first she seemed to be unsure regarding dosage of Bumex  and states she was taking 2 mg 1 daily but her med list has 1 mg tablets twice daily.  She brings in 2 mg tablets in her medication bag.  She also apparently never got prescription filled for Jardiance .  She does have scheduled follow-up with cardiology on Monday.  Has some chronic dyspnea with minimal exertion.  Past Medical History:  Diagnosis Date   AKI (acute kidney injury) (HCC) 06/22/2022   Amyloidosis (HCC) 05/24/2024   Aortic stenosis, mild 07/26/2017   Arthritis    Asthma    last attack 02/2015   Cellulitis of left lower extremity 08/17/2016   Ulcer associated with severe venous insufficiency   Chronic anticoagulation    Chronic diastolic CHF  (congestive heart failure) (HCC)    Chronic kidney disease    RIGHT MANY KIDNEY INFECTIONS AND STONES   COPD (chronic obstructive pulmonary disease) (HCC)    Coronary artery disease    Dizziness    H/O: rheumatic fever    Hypertension    PAF (paroxysmal atrial fibrillation) (HCC)    PONV (postoperative nausea and vomiting)    ' SOMETIMES', BUT NOT ALWAYS   Primary hypertension    RHEUMATIC MITRAL STENOSIS 01/05/2008   Qualifier: Diagnosis of   By: Neysa MD, Clinton D        S/P Maze operation for atrial fibrillation 10/14/2016   Complete bilateral atrial lesion set using cryothermy and bipolar radiofrequency ablation - atrial appendage was not treated due to previous surgical procedure (open mitral commissurotomy)   S/P mitral valve replacement with bioprosthetic valve 10/14/2016   29 mm Medtronic Mosaic porcine bioprosthetic tissue valve   Sciatica 08/29/2023   Tricuspid regurgitation 07/14/2021   UTI (urinary tract infection)    Valvular heart disease    Has mitral stenosis with prior mitral commissurotomy in 1970   Past Surgical History:  Procedure Laterality Date   ABDOMINAL HYSTERECTOMY  1983   endometriosis   APPENDECTOMY     BACK SURGERY     neurosurgery x2   CARDIAC CATHETERIZATION     CARDIAC CATHETERIZATION N/A 08/03/2016   Procedure: Right/Left Heart Cath and Coronary Angiography;  Surgeon: Peter M Swaziland, MD;  Location: [facility] INVASIVE CV LAB;  Service: Cardiovascular;  Laterality: N/A;   cataract surg     CHOLECYSTECTOMY N/A 05/30/2015  Procedure: LAPAROSCOPIC CHOLECYSTECTOMY WITH INTRAOPERATIVE CHOLANGIOGRAM;  Surgeon: Morene Olives, MD;  Location: [facility] OR;  Service: General;  Laterality: N/A;   COLONOSCOPY     CORONARY ARTERY BYPASS GRAFT N/A 10/14/2016   Procedure: CORONARY ARTERY BYPASS GRAFTING (CABG);  Surgeon: Sudie VEAR Laine, MD;  Location: [facility] OR;  Service: Open Heart Surgery;  Laterality: N/A;   EYE SURGERY     IR BONE MARROW BIOPSY & ASPIRATION   05/21/2024   IR THORACENTESIS ASP PLEURAL SPACE W/IMG GUIDE  04/24/2024   MAZE N/A 10/14/2016   Procedure: MAZE;  Surgeon: Sudie VEAR Laine, MD;  Location: [facility] OR;  Service: Open Heart Surgery;  Laterality: N/A;   MITRAL VALVE REPLACEMENT N/A 10/14/2016   Procedure: REDO MITRAL VALVE REPLACEMENT (MVR);  Surgeon: Sudie VEAR Laine, MD;  [facility];  Service: Open Heart Surgery;  Laterality: N/A;   MITRAL VALVE SURGERY Left 1970   Open mitral commissurotomy via left thoracotomy approach   PACEMAKER IMPLANT N/A 09/22/2023   Procedure: PACEMAKER IMPLANT;  Surgeon: Kennyth Chew, MD;  Location: [facility] INVASIVE CV LAB;  Service: Cardiovascular;  Laterality: N/A;   RIGHT HEART CATH N/A 05/17/2024   Procedure: RIGHT HEART CATH;  Surgeon: Darron Deatrice LABOR, MD;  Location: [facility] INVASIVE CV LAB;  Service: Cardiovascular;  Laterality: N/A;   TEE WITHOUT CARDIOVERSION N/A 07/05/2016   Procedure: TRANSESOPHAGEAL ECHOCARDIOGRAM (TEE);  Surgeon: Annabella Scarce, MD;  Location: [facility] ENDOSCOPY;  Service: Cardiovascular;  Laterality: N/A;   TEE WITHOUT CARDIOVERSION N/A 10/14/2016   Procedure: TRANSESOPHAGEAL ECHOCARDIOGRAM (TEE);  Surgeon: Sudie VEAR Laine, MD;  Location: [facility] OR;  Service: Open Heart Surgery;  Laterality: N/A;    reports that she quit smoking about 48 years ago. Her smoking use included cigarettes. She started smoking about 63 years ago. She has a 15 pack-year smoking history. She has never used smokeless tobacco. She reports that she does not drink alcohol and does not use drugs. family history includes Leukemia in her father. Allergies  Allergen Reactions   Aldactone  [Spironolactone ] Other (See Comments)    Dyspnea    Amoxil  [Amoxicillin ] Palpitations and Other (See Comments)    Tachycardia   Zebeta  [Bisoprolol ] Shortness Of Breath   Calan  [Verapamil ] Other (See Comments)    Myalgias    Crestor  [Rosuvastatin ] Other (See Comments)    Myalgias    Flovent  [Fluticasone  Propionate] Other (See  Comments)    Leg cramps   Livalo  [Pitavastatin ] Other (See Comments)    Myalgias    Lyrica  [Pregabalin ] Swelling   Neurontin [Gabapentin] Swelling   Norvasc  [Amlodipine ] Swelling    Low extremity edema   Zetia  [Ezetimibe ] Other (See Comments)    Myalgias   Zocor  [Simvastatin ] Other (See Comments)    Myalgias    Lotensin [Benazepril] Cough   Polytrim  [Polymyxin B -Trimethoprim ] Swelling    Review of Systems  Constitutional:  Positive for malaise/fatigue. Negative for chills and fever.  Respiratory:  Positive for shortness of breath. Negative for cough and hemoptysis.   Cardiovascular:  Positive for leg swelling. Negative for chest pain.  Gastrointestinal:  Negative for abdominal pain.  Genitourinary:  Negative for dysuria.      Objective:     BP 132/78   Pulse 81   Temp 98.2 F (36.8 C) (Oral)   Wt 140 lb 3.2 oz (63.6 kg)   SpO2 94%   BMI 24.84 kg/m  BP Readings from Last 3 Encounters:  06/01/24 132/78  05/30/24 (!) 110/54  05/25/24 (!) 108/58   Wt Readings from  Last 3 Encounters:  06/01/24 140 lb 3.2 oz (63.6 kg)  06/01/24 135 lb (61.2 kg)  05/25/24 127 lb (57.6 kg)      Physical Exam Vitals reviewed.  Constitutional:      General: She is not in acute distress. Cardiovascular:     Rate and Rhythm: Normal rate.  Pulmonary:     Effort: Pulmonary effort is normal.     Comments: Diminished breath sounds right base consistent with recent pleural effusion.  Slightly diminished breath sounds left base Musculoskeletal:     Comments: She has significant pitting edema all the way up to the thigh bilaterally.  Skin:    Comments: She has some scattered blood blisters popliteal space bilaterally.  Skin intact.  Neurological:     Mental Status: She is alert.      No results found for any visits on 06/01/24.    The ASCVD Risk score (Arnett DK, et al., 2019) failed to calculate for the following reasons:   The 2019 ASCVD risk score is only valid for ages 54 to  10    Assessment & Plan:   #1 recent admission for acute on chronic diastolic heart failure.  She has significant bilateral lower extremity edema today.  She states this never really went down significantly with recent admission.  She is currently on Bumex  and not clear what dose she is currently taking.  She thinks this is 2 mg daily.  Lower extremities were wrapped with compression wrap.  She has not tolerated compression stockings.  Patient was not taking Jardiance  and refill was provided.  Increase Bumex  2 mg to 2 daily over the weekend until follow-up with cardiology on Monday.  Elevate legs frequently.  #2 chronic atrial fibrillation.  Patient on Eliquis  and followed per cardiology.  #3  plasma cell myeloma by recent bone marrow biopsy.  Follow up with oncology scheduled.   Influenza vaccine given  Return in about 4 weeks (around 06/29/2024).    Wolm Scarlet, MD

## 2024-06-04 ENCOUNTER — Ambulatory Visit (HOSPITAL_BASED_OUTPATIENT_CLINIC_OR_DEPARTMENT_OTHER): Admitting: Cardiovascular Disease

## 2024-06-04 ENCOUNTER — Encounter (HOSPITAL_BASED_OUTPATIENT_CLINIC_OR_DEPARTMENT_OTHER): Payer: Self-pay | Admitting: Cardiovascular Disease

## 2024-06-04 ENCOUNTER — Emergency Department (HOSPITAL_BASED_OUTPATIENT_CLINIC_OR_DEPARTMENT_OTHER): Admitting: Radiology

## 2024-06-04 ENCOUNTER — Inpatient Hospital Stay (HOSPITAL_BASED_OUTPATIENT_CLINIC_OR_DEPARTMENT_OTHER)
Admission: EM | Admit: 2024-06-04 | Discharge: 2024-06-12 | DRG: 291 | Disposition: A | Discharge status: Home / Self Care | Attending: Internal Medicine | Admitting: Internal Medicine

## 2024-06-04 ENCOUNTER — Inpatient Hospital Stay: Admitting: Family Medicine

## 2024-06-04 ENCOUNTER — Other Ambulatory Visit: Payer: Self-pay

## 2024-06-04 VITALS — BP 124/68 | HR 108 | Ht 63.0 in | Wt 139.0 lb

## 2024-06-04 DIAGNOSIS — Z87891 Personal history of nicotine dependence: Secondary | ICD-10-CM

## 2024-06-04 DIAGNOSIS — Z951 Presence of aortocoronary bypass graft: Secondary | ICD-10-CM

## 2024-06-04 DIAGNOSIS — J449 Chronic obstructive pulmonary disease, unspecified: Secondary | ICD-10-CM | POA: Diagnosis present

## 2024-06-04 DIAGNOSIS — Z807 Family history of other malignant neoplasms of lymphoid, hematopoietic and related tissues: Secondary | ICD-10-CM

## 2024-06-04 DIAGNOSIS — I2510 Atherosclerotic heart disease of native coronary artery without angina pectoris: Secondary | ICD-10-CM | POA: Diagnosis present

## 2024-06-04 DIAGNOSIS — Z7189 Other specified counseling: Secondary | ICD-10-CM | POA: Diagnosis not present

## 2024-06-04 DIAGNOSIS — E854 Organ-limited amyloidosis: Secondary | ICD-10-CM | POA: Diagnosis present

## 2024-06-04 DIAGNOSIS — Z90710 Acquired absence of both cervix and uterus: Secondary | ICD-10-CM

## 2024-06-04 DIAGNOSIS — J9811 Atelectasis: Secondary | ICD-10-CM | POA: Diagnosis not present

## 2024-06-04 DIAGNOSIS — Z880 Allergy status to penicillin: Secondary | ICD-10-CM

## 2024-06-04 DIAGNOSIS — N1831 Chronic kidney disease, stage 3a: Secondary | ICD-10-CM | POA: Diagnosis present

## 2024-06-04 DIAGNOSIS — D696 Thrombocytopenia, unspecified: Secondary | ICD-10-CM | POA: Diagnosis present

## 2024-06-04 DIAGNOSIS — I482 Chronic atrial fibrillation, unspecified: Secondary | ICD-10-CM

## 2024-06-04 DIAGNOSIS — I130 Hypertensive heart and chronic kidney disease with heart failure and stage 1 through stage 4 chronic kidney disease, or unspecified chronic kidney disease: Secondary | ICD-10-CM | POA: Diagnosis not present

## 2024-06-04 DIAGNOSIS — Z515 Encounter for palliative care: Secondary | ICD-10-CM | POA: Diagnosis not present

## 2024-06-04 DIAGNOSIS — I272 Pulmonary hypertension, unspecified: Secondary | ICD-10-CM | POA: Diagnosis not present

## 2024-06-04 DIAGNOSIS — N08 Glomerular disorders in diseases classified elsewhere: Secondary | ICD-10-CM | POA: Diagnosis not present

## 2024-06-04 DIAGNOSIS — Z950 Presence of cardiac pacemaker: Secondary | ICD-10-CM

## 2024-06-04 DIAGNOSIS — R600 Localized edema: Principal | ICD-10-CM | POA: Diagnosis present

## 2024-06-04 DIAGNOSIS — E785 Hyperlipidemia, unspecified: Secondary | ICD-10-CM | POA: Diagnosis present

## 2024-06-04 DIAGNOSIS — C9000 Multiple myeloma not having achieved remission: Secondary | ICD-10-CM | POA: Diagnosis not present

## 2024-06-04 DIAGNOSIS — I4891 Unspecified atrial fibrillation: Secondary | ICD-10-CM | POA: Diagnosis present

## 2024-06-04 DIAGNOSIS — J90 Pleural effusion, not elsewhere classified: Secondary | ICD-10-CM | POA: Diagnosis not present

## 2024-06-04 DIAGNOSIS — I480 Paroxysmal atrial fibrillation: Secondary | ICD-10-CM | POA: Diagnosis not present

## 2024-06-04 DIAGNOSIS — E876 Hypokalemia: Secondary | ICD-10-CM | POA: Diagnosis not present

## 2024-06-04 DIAGNOSIS — R64 Cachexia: Secondary | ICD-10-CM | POA: Diagnosis not present

## 2024-06-04 DIAGNOSIS — I5033 Acute on chronic diastolic (congestive) heart failure: Secondary | ICD-10-CM | POA: Diagnosis not present

## 2024-06-04 DIAGNOSIS — E43 Unspecified severe protein-calorie malnutrition: Secondary | ICD-10-CM | POA: Diagnosis not present

## 2024-06-04 DIAGNOSIS — I495 Sick sinus syndrome: Secondary | ICD-10-CM

## 2024-06-04 DIAGNOSIS — E1122 Type 2 diabetes mellitus with diabetic chronic kidney disease: Secondary | ICD-10-CM | POA: Diagnosis not present

## 2024-06-04 DIAGNOSIS — I493 Ventricular premature depolarization: Secondary | ICD-10-CM | POA: Diagnosis present

## 2024-06-04 DIAGNOSIS — N179 Acute kidney failure, unspecified: Secondary | ICD-10-CM

## 2024-06-04 DIAGNOSIS — R601 Generalized edema: Secondary | ICD-10-CM | POA: Diagnosis not present

## 2024-06-04 DIAGNOSIS — Z6824 Body mass index (BMI) 24.0-24.9, adult: Secondary | ICD-10-CM

## 2024-06-04 DIAGNOSIS — E8581 Light chain (AL) amyloidosis: Secondary | ICD-10-CM | POA: Diagnosis not present

## 2024-06-04 DIAGNOSIS — Z66 Do not resuscitate: Secondary | ICD-10-CM | POA: Diagnosis not present

## 2024-06-04 DIAGNOSIS — J984 Other disorders of lung: Secondary | ICD-10-CM | POA: Diagnosis not present

## 2024-06-04 DIAGNOSIS — M7989 Other specified soft tissue disorders: Secondary | ICD-10-CM | POA: Diagnosis not present

## 2024-06-04 DIAGNOSIS — J4489 Other specified chronic obstructive pulmonary disease: Secondary | ICD-10-CM | POA: Diagnosis not present

## 2024-06-04 DIAGNOSIS — I350 Nonrheumatic aortic (valve) stenosis: Secondary | ICD-10-CM | POA: Diagnosis not present

## 2024-06-04 DIAGNOSIS — E8809 Other disorders of plasma-protein metabolism, not elsewhere classified: Secondary | ICD-10-CM | POA: Diagnosis not present

## 2024-06-04 DIAGNOSIS — I9589 Other hypotension: Secondary | ICD-10-CM | POA: Diagnosis present

## 2024-06-04 DIAGNOSIS — R54 Age-related physical debility: Secondary | ICD-10-CM | POA: Diagnosis present

## 2024-06-04 DIAGNOSIS — I10 Essential (primary) hypertension: Secondary | ICD-10-CM

## 2024-06-04 DIAGNOSIS — Z953 Presence of xenogenic heart valve: Secondary | ICD-10-CM | POA: Diagnosis not present

## 2024-06-04 DIAGNOSIS — Z79899 Other long term (current) drug therapy: Secondary | ICD-10-CM

## 2024-06-04 DIAGNOSIS — Z7901 Long term (current) use of anticoagulants: Secondary | ICD-10-CM

## 2024-06-04 DIAGNOSIS — Z952 Presence of prosthetic heart valve: Secondary | ICD-10-CM | POA: Diagnosis not present

## 2024-06-04 DIAGNOSIS — I110 Hypertensive heart disease with heart failure: Secondary | ICD-10-CM | POA: Diagnosis not present

## 2024-06-04 DIAGNOSIS — Z888 Allergy status to other drugs, medicaments and biological substances status: Secondary | ICD-10-CM

## 2024-06-04 DIAGNOSIS — R531 Weakness: Secondary | ICD-10-CM | POA: Diagnosis not present

## 2024-06-04 DIAGNOSIS — R0989 Other specified symptoms and signs involving the circulatory and respiratory systems: Secondary | ICD-10-CM | POA: Diagnosis not present

## 2024-06-04 DIAGNOSIS — G4700 Insomnia, unspecified: Secondary | ICD-10-CM | POA: Diagnosis not present

## 2024-06-04 DIAGNOSIS — Z789 Other specified health status: Secondary | ICD-10-CM | POA: Diagnosis not present

## 2024-06-04 DIAGNOSIS — Z87440 Personal history of urinary (tract) infections: Secondary | ICD-10-CM

## 2024-06-04 DIAGNOSIS — I509 Heart failure, unspecified: Secondary | ICD-10-CM

## 2024-06-04 LAB — COMPREHENSIVE METABOLIC PANEL WITH GFR
ALT: 12 U/L (ref 0–44)
AST: 29 U/L (ref 15–41)
Albumin: 2.7 g/dL — ABNORMAL LOW (ref 3.5–5.0)
Alkaline Phosphatase: 108 U/L (ref 38–126)
Anion gap: 12 (ref 5–15)
BUN: 31 mg/dL — ABNORMAL HIGH (ref 8–23)
CO2: 24 mmol/L (ref 22–32)
Calcium: 9.5 mg/dL (ref 8.9–10.3)
Chloride: 103 mmol/L (ref 98–111)
Creatinine, Ser: 1.55 mg/dL — ABNORMAL HIGH (ref 0.44–1.00)
GFR, Estimated: 32 mL/min — ABNORMAL LOW (ref >60)
Glucose, Bld: 106 mg/dL — ABNORMAL HIGH (ref 70–99)
Potassium: 3.7 mmol/L (ref 3.5–5.1)
Sodium: 138 mmol/L (ref 135–145)
Total Bilirubin: 0.3 mg/dL (ref 0.0–1.2)
Total Protein: 5.6 g/dL — ABNORMAL LOW (ref 6.5–8.1)

## 2024-06-04 LAB — URINALYSIS, ROUTINE W REFLEX MICROSCOPIC
Bilirubin Urine: NEGATIVE
Glucose, UA: NEGATIVE mg/dL
Ketones, ur: NEGATIVE mg/dL
Leukocytes,Ua: NEGATIVE
Nitrite: NEGATIVE
Protein, ur: >300 mg/dL — AB
Specific Gravity, Urine: 1.009 (ref 1.005–1.030)
pH: 6 (ref 5.0–8.0)

## 2024-06-04 LAB — CBC
HCT: 49.7 % — ABNORMAL HIGH (ref 36.0–46.0)
Hemoglobin: 16.6 g/dL — ABNORMAL HIGH (ref 12.0–15.0)
MCH: 30.9 pg (ref 26.0–34.0)
MCHC: 33.4 g/dL (ref 30.0–36.0)
MCV: 92.4 fL (ref 80.0–100.0)
Platelets: 70 K/uL — ABNORMAL LOW (ref 150–400)
RBC: 5.38 MIL/uL — ABNORMAL HIGH (ref 3.87–5.11)
RDW: 14.2 % (ref 11.5–15.5)
WBC: 3.2 K/uL — ABNORMAL LOW (ref 4.0–10.5)
nRBC: 0 % (ref 0.0–0.2)

## 2024-06-04 LAB — PRO BRAIN NATRIURETIC PEPTIDE: Pro Brain Natriuretic Peptide: 28847 pg/mL — ABNORMAL HIGH (ref <300.0)

## 2024-06-04 MED ORDER — MONTELUKAST SODIUM 5 MG PO CHEW
5.0000 mg | CHEWABLE_TABLET | Freq: Every day | ORAL | Status: DC
Start: 2024-06-04 — End: 2024-06-12
  Administered 2024-06-04 – 2024-06-11 (×8): 5 mg via ORAL
  Filled 2024-06-04 (×9): qty 1

## 2024-06-04 MED ORDER — IPRATROPIUM-ALBUTEROL 0.5-2.5 (3) MG/3ML IN SOLN
3.0000 mL | Freq: Once | RESPIRATORY_TRACT | Status: AC
  Administered 2024-06-04: 3 mL via RESPIRATORY_TRACT

## 2024-06-04 MED ORDER — SALINE SPRAY 0.65 % NA SOLN
1.0000 | NASAL | Status: DC | PRN
  Administered 2024-06-10: 1 via NASAL
  Filled 2024-06-04: qty 44

## 2024-06-04 MED ORDER — HYDROCODONE-ACETAMINOPHEN 5-325 MG PO TABS
1.0000 | ORAL_TABLET | Freq: Three times a day (TID) | ORAL | Status: DC | PRN
  Administered 2024-06-05 – 2024-06-10 (×6): 1 via ORAL
  Filled 2024-06-04 (×7): qty 1

## 2024-06-04 MED ORDER — IPRATROPIUM-ALBUTEROL 0.5-2.5 (3) MG/3ML IN SOLN
RESPIRATORY_TRACT | Status: AC
  Filled 2024-06-04: qty 3

## 2024-06-04 MED ORDER — ONDANSETRON HCL 4 MG PO TABS: 4.0000 mg | ORAL_TABLET | Freq: Four times a day (QID) | ORAL | Status: DC | PRN

## 2024-06-04 MED ORDER — DIAZEPAM 5 MG PO TABS
5.0000 mg | ORAL_TABLET | Freq: Every day | ORAL | Status: DC
Start: 2024-06-04 — End: 2024-06-12
  Administered 2024-06-04 – 2024-06-11 (×8): 5 mg via ORAL
  Filled 2024-06-04 (×8): qty 1

## 2024-06-04 MED ORDER — ALBUTEROL SULFATE (2.5 MG/3ML) 0.083% IN NEBU
2.5000 mg | INHALATION_SOLUTION | Freq: Four times a day (QID) | RESPIRATORY_TRACT | Status: DC | PRN
  Administered 2024-06-04 – 2024-06-06 (×3): 2.5 mg via RESPIRATORY_TRACT
  Filled 2024-06-04 (×3): qty 3

## 2024-06-04 MED ORDER — FUROSEMIDE 10 MG/ML IJ SOLN
40.0000 mg | Freq: Two times a day (BID) | INTRAMUSCULAR | Status: DC
  Administered 2024-06-04 – 2024-06-05 (×2): 40 mg via INTRAVENOUS
  Filled 2024-06-04 (×2): qty 4

## 2024-06-04 MED ORDER — ENSURE PLUS HIGH PROTEIN PO LIQD
237.0000 mL | Freq: Two times a day (BID) | ORAL | Status: DC
  Administered 2024-06-05 – 2024-06-12 (×11): 237 mL via ORAL

## 2024-06-04 MED ORDER — IPRATROPIUM-ALBUTEROL 0.5-2.5 (3) MG/3ML IN SOLN: 3.0000 mL | Freq: Four times a day (QID) | RESPIRATORY_TRACT | Status: DC | PRN

## 2024-06-04 MED ORDER — FUROSEMIDE 10 MG/ML IJ SOLN
40.0000 mg | Freq: Once | INTRAMUSCULAR | Status: AC
  Administered 2024-06-04: 40 mg via INTRAVENOUS
  Filled 2024-06-04: qty 4

## 2024-06-04 MED ORDER — PREGABALIN 100 MG PO CAPS
100.0000 mg | ORAL_CAPSULE | Freq: Every day | ORAL | Status: DC
Start: 2024-06-04 — End: 2024-06-12
  Administered 2024-06-04 – 2024-06-11 (×7): 100 mg via ORAL
  Filled 2024-06-04 (×8): qty 1

## 2024-06-04 MED ORDER — ONDANSETRON HCL 4 MG/2ML IJ SOLN: 4.0000 mg | Freq: Four times a day (QID) | INTRAMUSCULAR | Status: DC | PRN

## 2024-06-04 MED ORDER — APIXABAN 2.5 MG PO TABS
2.5000 mg | ORAL_TABLET | Freq: Two times a day (BID) | ORAL | Status: DC
Start: 2024-06-04 — End: 2024-06-12
  Administered 2024-06-04 – 2024-06-12 (×16): 2.5 mg via ORAL
  Filled 2024-06-04 (×16): qty 1

## 2024-06-04 MED ORDER — ACETAMINOPHEN 500 MG PO TABS
500.0000 mg | ORAL_TABLET | Freq: Four times a day (QID) | ORAL | Status: DC | PRN
  Filled 2024-06-04: qty 1

## 2024-06-04 MED ORDER — ESTRADIOL 0.1 MG/GM VA CREA
1.0000 | TOPICAL_CREAM | VAGINAL | Status: DC
  Administered 2024-06-06 – 2024-06-08 (×2): 1 via VAGINAL
  Filled 2024-06-04: qty 42.5

## 2024-06-04 MED ORDER — EMPAGLIFLOZIN 10 MG PO TABS
10.0000 mg | ORAL_TABLET | Freq: Every day | ORAL | Status: DC
  Administered 2024-06-05 – 2024-06-12 (×8): 10 mg via ORAL
  Filled 2024-06-04 (×8): qty 1

## 2024-06-04 MED ORDER — MIDODRINE HCL 5 MG PO TABS
10.0000 mg | ORAL_TABLET | Freq: Three times a day (TID) | ORAL | Status: DC
  Administered 2024-06-05 – 2024-06-12 (×21): 10 mg via ORAL
  Filled 2024-06-04 (×22): qty 2

## 2024-06-04 MED ORDER — VITAMIN D 25 MCG (1000 UNIT) PO TABS
1000.0000 [IU] | ORAL_TABLET | Freq: Every day | ORAL | Status: DC
  Administered 2024-06-05 – 2024-06-12 (×8): 1000 [IU] via ORAL
  Filled 2024-06-04 (×8): qty 1

## 2024-06-04 NOTE — Assessment & Plan Note (Signed)
 Patient with a history of multiple myeloma with associated hypoproteinemia resulting in significant lower extremity swelling Consult oncology in a.m.

## 2024-06-04 NOTE — Progress Notes (Signed)
 Cardiology Office Note:  .   Date:  06/04/2024  ID:  Catherine [name], DOB 03-09-1935, MRN [id number] PCP: [name] Wolm ORN, MD  [facility] Providers Cardiologist:  Annabella Scarce, MD Electrophysiologist:  Fonda Kitty, MD    History of Present Illness: .    Catherine [name] is a 89 y.o. 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  Catherine [name] was previously a patient of Dr. Dominick. She underwent mitral commissurotomy in the 1970s. On her echocardiogram 02/2014 she had an ejection fraction of 60-65% with moderate to severe mitral regurgitation. There was also mild aortic regurgitation. Her follow-up echo 02/2015 showed an EF of 60-65% with moderate to severe mitral regurgitation, moderate tricuspid regurgitation and elevated pulmonary artery pressures.  Repeat echo 9/11/7 revealed moderate pulmonary stenosis with moderate to severe mitral regurgitation.  She did not have any pulmonary hypertension and her LV was not dilated.  She was referred for left and right heart catheterization. She was found to have severe mitral stenosis with a 13 mmHg mean gradient, MVA 0.99 cm^2. She also had 50% LCx and 80% RCA lesions.  She had a 29 mm bioprosthetic mitral valve, single vessel CABG and MAZE with Catherine [name] on 10/14/16.  She was referred for a baseline echo 02/15/17 that revealed LVEF 60-65% with mild aortic stenosis (mean gradient 12 mmHg).  Her bioprosthetic mitral valve was functioning well.     After her surgery Catherine [name] continued to report fatigue.  It was unclear whether she was in atrial fibrillation or sinus rhythm with a long PR interval.  She was referred to the atrial fibrillation clinic where this remained unclear.  However, it was felt that if she were in atrial fibrillation it would  not be possible to maintain sinus rhythm.  She developed lower extremity edema. She stopped amlodipine  and this improved. This was switched to irbesartan . She then developed diaphoresis and insomnia that she attributed to this medication. She was switched from lasix  to torsemide  which helped her abdominal bloating but she still had LE edema. She has followed up with Catherine [name] and noted increased LE edema and hip pain. She also reported dizziness and had a repeat echo 06/2021 with LV 60-65%. Her bioprosthetic mitral valve was functioning properly. She had moderate to severe tricuspid regurgitation and PASP was .    She reported some shortness of breath that improved with torsemide . Her at home blood pressure was ranging 110s-150s/50s-60s, averaging in the 120s-130s. On 10/25/2021 she presented to the ED with complaints of fatigue, cough, nausea, fever, congestion, and headache. Her symptoms were thought to have a viral etiology. She was later admitted to the hospital 10/29/2021 and was found to be hypoxic to 84% with activity, BNP 234, and a chest CT revealed right middle lobe atelectasis and small pleural effusions. 2D echo noted preserved EF. She had moderate TR and her bioprosthetic mitral valve was stable. She also reported right lateral chest wall pain with upper back pain. It was suspected she had a COPD exacerbation with mild component of diastolic CHF.    She was hospitalized 12/2020 with acute on chronic diastolic heart failure and COPD.  She was diuresed with IV Lasix  and started on low-dose losartan .  In the hospital she was noted to have episodes of A-fib with bradycardia.  Her heart rates dropped into the 40s and she had up to 2.4-second pauses on metoprolol .  Her metoprolol  dose was reduced.  She followed up in advanced heart failure clinic 12/2021 and was feeling much better.  She saw Catherine Finder, [name] 01/2022 and her dyspnea was improving.  She was started on amiodarone . It made her feel  poorly, she said her hair was falling out, and it affected her coumadin  levels. Amiodarone  was discontinued. Torsemide  was switched to Bumex . Losartan  was switched to Entresto . She was referred to the PREP program, and followed up with Catherine [name] the following month. Her weight was down 8 lbs. She had a good response to metolazone , so it was added once per week.    Due to low BP and overdiureiss, Bumex  was reduced to once daily. Metolazone  was changed to prn for edema, shortness of breath, or other heart failure symptoms.  At her visit 06/2022 she was not feeling well.  She also noted weight gain and recurrent UTIs.  Therefore she was not started on SGLT2 inhibitor.  Repeat echo 10/2022 revealed LVEF 60-65% with moderately reduced RV function.  Mitral valve prosthesis was appropriately functioning and she had mild AS with mean gradient of 11 mmHg.  Metoprolol  was reduced due to bradycardia.  At her visit 08/2023 she was struggling with leg pain that was incapacitating.  She was admitted to the hospital 08/2023 with NSVT/VT, junctional tachycardia, and acute on chronic HFpEF.  There is a plan for her to undergo an SVT ablation due to beta-blocker intolerance.  During hospitalization 04/2024 she was noted to have nephrotic range proteinuria.  She had lost 35 pounds due to loss of appetite.  She was evaluated by nephrology who recommended that she be seen by oncology and was ultimately diagnosed with multiple myeloma.  During that hospitalization she is significantly volume overloaded.  Echo revealed LVEF 70-75% with indeterminate diastolic function.  Mean mitral valve gradient was 8 mmHg.  Right atrial pressure was 15 mmHg.  RV function was mildly reduced.  She was diuresed and underwent right heart cath 05/17/2024 with right atrial pressures as below.  She was encouraged to wear compression socks.  However she was seen in the ED 05/2024 with a hematoma thought to be caused by the compression.  She started doing lower  extremity wrapping instead and seemed to tolerate this better when she saw Catherine [name].  She was encouraged to increase her Bumex  to 2 mg twice daily until follow-up here today.  Discussed the use of AI scribe software for clinical note transcription with the patient, who gave verbal consent to proceed.  History of Present Illness Catherine [name] notes that her hematoma was wrapped but has since ruptured, causing significant leakage. She notes that it ruptured just before the current visit, causing a mess.  There is a mild amount of blood but it is mostly clear.  She has a history of heart failure and multiple myeloma. She experiences significant swelling in her legs, which has worsened since her last hospital stay. She reports that her leg swelling has worsened since her last hospital stay. Despite taking Bumex , two tablets twice a day, she continues to gain weight. She reports gaining a pound daily and sometimes two pounds in a day.  Her breathing is described as 'terrible,' requiring breathing treatments every four hours. She also reports skin issues, including redness and peeling, where bandages have been applied, along with itching and discomfort in these areas.  She is currently taking several  medications, including Bumex , Jardiance , Eliquis , midodrine , and a vitamin supplement. Her appetite is good, and she has been eating well.  ROS:  As per HPI  Studies Reviewed: .       Echo 04/24/24: 1. No resting LVOT gradient. Left ventricular ejection fraction, by  estimation, is 70 to 75%. The left ventricle has hyperdynamic function.  The left ventricle has no regional wall motion abnormalities. Left  ventricular diastolic parameters are  indeterminate.   2. Right ventricular systolic function is mildly reduced. The right  ventricular size is normal. There is moderately elevated pulmonary artery  systolic pressure. The estimated right ventricular systolic pressure is  50.5 mmHg.   3. Left  atrial size was severely dilated.   4. Right atrial size was mildly dilated.   5. Small, echodensity noted on the chordal apparatus unchanged from 2023  PLAX view. There new LVOT is small related to valve struts with LVOT flow  acceleration. The mitral valve has been repaired/replaced. No evidence of  mitral valve regurgitation. The  mean mitral valve gradient is 8.0 mmHg. There is a 29 mm [employer]  bioprosthetic valve present in the mitral position. Procedure Date:  10/14/2016.   6. Tricuspid valve regurgitation is moderate.   7. The aortic valve is tricuspid. Aortic valve regurgitation is not  visualized. Mild to moderate aortic valve stenosis. Aortic valve area, by  VTI measures 1.04 cm. Aortic valve mean gradient measures 19.0 mmHg.  Aortic valve Vmax measures 3.02 m/s.   8. The inferior vena cava is dilated in size with <50% respiratory  variability, suggesting right atrial pressure of 15 mmHg.   RHC 05/17/24: RA: 8 mmHg, RV 51/2 mmHg, PW: 18 mmHg, PA: 50/10 with a mean of 28 mmHg, cardiac output is 4.64 with an index of 2.92.    Risk Assessment/Calculations:    CHA2DS2-VASc Score = 6   This indicates a 9.7% annual risk of stroke. The patient's score is based upon: CHF History: 1 HTN History: 1 Diabetes History: 0 Stroke History: 0 Vascular Disease History: 1 Age Score: 2 Gender Score: 1            Physical Exam:   VS:  BP 124/68   Pulse (!) 108   Ht 5' 3 (1.6 m)   Wt 139 lb (63 kg)   BMI 24.62 kg/m  , BMI Body mass index is 24.62 kg/m. GENERAL:  Ill-appearing.  Frail HEENT: Pupils equal round and reactive, fundi not visualized, oral mucosa unremarkable NECK: + jugular venous distention to mid neck sitting upright, waveform within normal limits, carotid upstroke brisk and symmetric, no bruits, no thyromegaly LUNGS:  Clear to auscultation bilaterally HEART:  RRR.  PMI not displaced or sustained,S1 and S2 within normal limits, no S3, no S4, no clicks, no rubs,  II/VI systolic murmur at the LUSB ABD:  Flat, positive bowel sounds normal in frequency in pitch, no bruits, no rebound, no guarding, no midline pulsatile mass, no hepatomegaly, no splenomegaly EXT:  2 plus pulses throughout, 3 + pitting edema to the thighs bilaterally.  Weeping L popliteal foss.  No cyanosis no clubbing SKIN:  No rashes no nodules NEURO:  Cranial nerves II through XII grossly intact, motor grossly intact throughout PSYCH:  Cognitively intact, oriented to person place and time   ASSESSMENT AND PLAN: .    Assessment & Plan # Acute on chronic diastolic heart failure with volume overload Significant volume overload with persistent fluid retention despite increased Bumex . Her swelling is out of  proportion to her diastolic dysfunctoin, suggests other factors contributing to swelling. Significant weight gain indicates fluid retention. Continue Jardiance .  - Admit for aggressive diuresis with IV Lasix . - Monitor kidney function during diuresis. - Coordinate with inpatient cardiology for management.  # Refractory lower extremity edema with skin breakdown and hematoma Severe lower extremity edema with skin breakdown and ruptured hematoma causing significant leakage. Edema is refractory to oral diuretics. Skin breakdown likely exacerbated by pressure from wraps. - Admit for management of edema and skin breakdown. - Consult wound care for skin management. - Allow removal of wraps at night when legs are elevated.  # Multiple myeloma with associated hypoproteinemia and nephrotic syndrome Multiple myeloma contributes to hypoproteinemia and nephrotic syndrome, leading to fluid leakage into tissues. Significant protein loss affects fluid retention. - Notify oncology of hospital admission for inpatient consultation. - Monitor protein levels and kidney function.  Chronic kidney disease 3a: Chronic kidney disease contributes to fluid retention and complicates diuretic management. -  Monitor kidney function during hospital stay.  # Paroxysmal atrial fibrillation Managed with Eliquis .  # Hypotension:  Required midodrine  for BP support.  # SVT: She was scheduled for ablation but is not stable at this time.    Goals of Care Discussed hospitalization necessity despite reluctance. Emphasized addressing underlying issues to prevent recurrence. Focus remains on improving quality of life.  Per the oncology notes, she is a candidate for chemotherapy.  I'm concerned about her ability to tolerate this.  She has some pressure to be around to care for her husband who has progressive dementia.  Today she reported, maybe this is the end for me.  Recommend inpatient palliative care involvement.  - Admit for comprehensive management of multiple conditions. - Coordinate care with cardiology, oncology, palliative care, and wound care.  Follow-Up to be determined during hospitalization.    Signed, Annabella Scarce, MD

## 2024-06-04 NOTE — Assessment & Plan Note (Signed)
Stable Continue Eliquis 

## 2024-06-04 NOTE — Assessment & Plan Note (Signed)
Continue as needed bronchodilator therapy and inhaled steroids

## 2024-06-04 NOTE — H&P (Signed)
 History and Physical    Patient: Catherine [name] FMW:[id number] DOB: 1934-12-02 DOA: 06/04/2024 DOS: the patient was seen and examined on 06/04/2024 PCP: Micheal Wolm ORN, MD  Patient coming from: Home  Chief Complaint:  Chief Complaint  Patient presents with   Wound Check   HPI: Catherine [name] is a 89 y.o. 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 She notes worsening lower extremity swelling despite being compliant with her medications and maintaining a low-sodium diet.  She also notes significant weight gain and reports gaining about a pound or 2 daily.  Her shortness of breath is worse from baseline and she currently is short of breath at rest.  She requires breathing treatments every 4 hours. She also complains of a hematoma in her left lower extremity which ruptured during her office visit and has been leaking. She denies having any cough, no fever, no chest pain, no changes in her bowel habits, no headache, no blurred vision, no focal deficit, no urinary symptoms. Per patient this is her fourth hospitalization in the last 2 months Abnormal labs include BUN of 31, creatinine of 1.55 compared to baseline of 1.17, proBNP 28,000 847, platelet count 70, hemoglobin 16.6, hematocrit 49.7 Chest x-ray reviewed by me shows bilateral pleural effusions with interval slight decrease in moderate right-sided pleural effusion. Stable small left pleural effusion. Slight improvement of right lung base ground-glass attenuation/atelectasis. Twelve-lead EKG reviewed  by me shows A-fib with PVCs.  Anteroseptal infarct.  Age-indeterminate She will be admitted to the hospital for further evaluation.    Review of Systems: As mentioned in the history of present illness. All other systems reviewed and are negative. Past Medical History:  Diagnosis Date   AKI (acute kidney injury) (HCC) 06/22/2022   Amyloidosis (HCC) 05/24/2024   Aortic stenosis, mild 07/26/2017   Arthritis    Asthma    last attack 02/2015   Cellulitis of left lower extremity 08/17/2016   Ulcer associated with severe venous insufficiency   Chronic anticoagulation    Chronic diastolic CHF (congestive heart failure) (HCC)    Chronic kidney disease    RIGHT MANY KIDNEY INFECTIONS AND STONES   COPD (chronic obstructive pulmonary disease) (HCC)    Coronary artery disease    Dizziness    H/O: rheumatic fever    Hypertension    PAF (paroxysmal atrial fibrillation) (HCC)    PONV (postoperative nausea and vomiting)    ' SOMETIMES', BUT NOT ALWAYS   Primary hypertension    RHEUMATIC MITRAL STENOSIS 01/05/2008   Qualifier: Diagnosis of   By: Neysa MD, Clinton D        S/P Maze operation for atrial fibrillation 10/14/2016   Complete bilateral atrial lesion set using cryothermy and bipolar radiofrequency ablation - atrial appendage was not treated due to previous surgical procedure (open mitral commissurotomy)   S/P mitral valve replacement with bioprosthetic valve 10/14/2016   29 mm Medtronic Mosaic porcine bioprosthetic tissue valve   Sciatica 08/29/2023   Tricuspid regurgitation 07/14/2021   UTI (urinary tract infection)    Valvular heart disease    Has mitral stenosis with prior mitral commissurotomy in 1970  Past Surgical History:  Procedure Laterality Date   ABDOMINAL HYSTERECTOMY  1983   endometriosis   APPENDECTOMY     BACK SURGERY     neurosurgery x2   CARDIAC CATHETERIZATION     CARDIAC CATHETERIZATION N/A 08/03/2016   Procedure: Right/Left Heart Cath and Coronary  Angiography;  Surgeon: Peter M Swaziland, MD;  Location: [facility] INVASIVE CV LAB;  Service: Cardiovascular;  Laterality: N/A;   cataract surg     CHOLECYSTECTOMY N/A 05/30/2015   Procedure: LAPAROSCOPIC CHOLECYSTECTOMY WITH INTRAOPERATIVE CHOLANGIOGRAM;  Surgeon: Morene Olives, MD;  Location: [facility] OR;  Service: General;  Laterality: N/A;   COLONOSCOPY     CORONARY ARTERY BYPASS GRAFT N/A 10/14/2016   Procedure: CORONARY ARTERY BYPASS GRAFTING (CABG);  Surgeon: Sudie VEAR Laine, MD;  Location: [facility] OR;  Service: Open Heart Surgery;  Laterality: N/A;   EYE SURGERY     IR BONE MARROW BIOPSY & ASPIRATION  05/21/2024   IR THORACENTESIS ASP PLEURAL SPACE W/IMG GUIDE  04/24/2024   MAZE N/A 10/14/2016   Procedure: MAZE;  Surgeon: Sudie VEAR Laine, MD;  Location: [facility] OR;  Service: Open Heart Surgery;  Laterality: N/A;   MITRAL VALVE REPLACEMENT N/A 10/14/2016   Procedure: REDO MITRAL VALVE REPLACEMENT (MVR);  Surgeon: Sudie VEAR Laine, MD;  Location: [facility] OR;  Service: Open Heart Surgery;  Laterality: N/A;   MITRAL VALVE SURGERY Left 1970   Open mitral commissurotomy via left thoracotomy approach   PACEMAKER IMPLANT N/A 09/22/2023   Procedure: PACEMAKER IMPLANT;  Surgeon: Kennyth Chew, MD;  Location: [facility] INVASIVE CV LAB;  Service: Cardiovascular;  Laterality: N/A;   RIGHT HEART CATH N/A 05/17/2024   Procedure: RIGHT HEART CATH;  Surgeon: Darron Deatrice LABOR, MD;  Location: [facility] INVASIVE CV LAB;  Service: Cardiovascular;  Laterality: N/A;   TEE WITHOUT CARDIOVERSION N/A 07/05/2016   Procedure: TRANSESOPHAGEAL ECHOCARDIOGRAM (TEE);  Surgeon: Annabella Scarce, MD;  Location: [facility] ENDOSCOPY;  Service: Cardiovascular;  Laterality: N/A;   TEE WITHOUT CARDIOVERSION N/A 10/14/2016   Procedure: TRANSESOPHAGEAL ECHOCARDIOGRAM (TEE);  Surgeon: Sudie VEAR Laine, MD;  Location: [facility] OR;  Service: Open Heart Surgery;  Laterality: N/A;   Social History:  reports that she quit smoking about 48 years ago. Her smoking use included cigarettes.  She started smoking about 63 years ago. She has a 15 pack-year smoking history. She has never used smokeless tobacco. She reports that she does not drink alcohol and does not use drugs.  Allergies  Allergen Reactions   Aldactone  [Spironolactone ] Other (See Comments)    Dyspnea    Amoxil  [Amoxicillin ] Palpitations and Other (See Comments)    Tachycardia   Zebeta  [Bisoprolol ] Shortness Of Breath   Calan  [Verapamil ] Other (See Comments)    Myalgias    Crestor  [Rosuvastatin ] Other (See Comments)    Myalgias    Flovent  [Fluticasone  Propionate] Other (See Comments)    Leg cramps   Livalo  [Pitavastatin ] Other (See Comments)    Myalgias    Lyrica  [Pregabalin ] Swelling   Neurontin [Gabapentin] Swelling   Norvasc  [Amlodipine ] Swelling    Low extremity edema   Zetia  [Ezetimibe ] Other (See Comments)    Myalgias   Zocor  [Simvastatin ] Other (See Comments)    Myalgias    Lotensin [Benazepril] Cough   Polytrim  [Polymyxin B -Trimethoprim ] Swelling    Family History  Problem Relation Age of Onset   Leukemia Father    Breast cancer Neg Hx     Prior to Admission medications   Medication Sig Start Date End Date Taking? Authorizing Provider  acetaminophen  (TYLENOL ) 500 MG tablet Take 500 mg by mouth every 6 (six) hours as needed for mild pain (pain score 1-3).    [provider]  albuterol  (PROVENTIL ) (2.5 MG/3ML) 0.083% nebulizer solution Take 3 mLs (2.5 mg total) by nebulization every 6 (six) hours as needed (during COPD Exacerbation). 02/15/24 02/14/25  Hope Almarie ORN, NP  albuterol  (VENTOLIN  HFA) 108 (90 Base) MCG/ACT inhaler INHALE 2 PUFFS INTO THE LUNGS EVERY 4 HOURS AS NEEDED FOR AHEEZING OR SHORTNESS OF BREATH 02/21/24   Parrett, Tammy S, NP  apixaban  (ELIQUIS ) 2.5 MG TABS tablet TAKE ONE TABLET TWICE DAILY (START 09/18/23 OR AS DIRECTED) 10/24/23   Burchette, Wolm ORN, MD  bumetanide  (BUMEX ) 1 MG tablet Take 2 tablets (2 mg total) by mouth daily. 05/25/24 06/24/24  Leotis Bogus,  MD  cholecalciferol  (VITAMIN D3) 25 MCG (1000 UNIT) tablet Take 1,000 Units by mouth in the morning.    [provider]  diazepam  (VALIUM ) 5 MG tablet TAKE ONE TABLET BY MOUTH AT BEDTIME 12/12/23   Burchette, Wolm ORN, MD  empagliflozin  (JARDIANCE ) 10 MG TABS tablet Take 1 tablet (10 mg total) by mouth daily. Patient not taking: Reported on 06/04/2024 06/01/24   Micheal Wolm ORN, MD  estradiol  (ESTRACE ) 0.1 MG/GM vaginal cream Place 1 Applicatorful vaginally See admin instructions. Apply a small amount vaginally three nights a week on Monday, Wednesday and Friday.    [provider]  feeding supplement (ENSURE PLUS HIGH PROTEIN) LIQD Take 237 mLs by mouth 2 (two) times daily between meals. 04/25/24   Arrien, Mauricio Daniel, MD  guaiFENesin  (MUCINEX ) 600 MG 12 hr tablet Take 1 tablet (600 mg total) by mouth 2 (two) times daily as needed for to loosen phlegm or cough. 04/25/24   Arrien, Mauricio Daniel, MD  HYDROcodone -acetaminophen  (NORCO/VICODIN) 5-325 MG tablet TAKE ONE TABLET EVERY 8 HOURS AS NEEDED FOR PAIN 05/12/24   Burchette, Wolm ORN, MD  midodrine  (PROAMATINE ) 10 MG tablet Take 1 tablet (10 mg total) by mouth 3 (three) times daily with meals. 05/24/24 06/23/24  Leotis Bogus, MD  montelukast  (SINGULAIR ) 5 MG chewable tablet Chew 1 tablet (5 mg total) by mouth at bedtime. 05/24/24 06/23/24  Leotis Bogus, MD  Multiple Vitamins-Minerals (CERTAVITE/ANTIOXIDANTS) TABS Take 1 tablet by mouth daily. Patient not taking: Reported on 06/04/2024    [provider]  ondansetron  (ZOFRAN ) 4 MG tablet Take 1 tablet (4 mg total) by mouth every 8 (eight) hours as needed for nausea or vomiting. Patient not taking: Reported on 06/04/2024 06/01/24   Micheal Wolm ORN, MD  pregabalin  (LYRICA ) 100 MG capsule Take 1 capsule (100 mg total) by mouth at bedtime for 15 days. 05/24/24 06/08/24  Leotis Bogus, MD  sodium chloride  (OCEAN) 0.65 % SOLN nasal spray Place 1 spray into both nostrils as needed  for congestion.    [provider]  Zinc  Sulfate 220 (50 Zn) MG TABS Take 1 tablet (220 mg total) by mouth daily. 05/25/24 06/24/24  Leotis Bogus, MD    Physical Exam: Vitals:   06/04/24 1815 06/04/24 1934 06/04/24 2040 06/04/24 2043  BP:  116/61  (!) 104/94  Pulse: 61 (!) 59    Resp: 15 (!) 24  (!) 21  Temp:  98.2 F (36.8 C)  98.2 F (36.8 C)  TempSrc:  Oral  Oral  SpO2: 99% 94%  95%  Weight:   62.2 kg    Physical Exam Vitals and nursing note reviewed.  Constitutional:      Comments: Chronically ill-appearing  HENT:     Head: Normocephalic.     Nose: Nose normal.     Mouth/Throat:     Mouth: Mucous membranes are dry.  Eyes:     Conjunctiva/sclera: Conjunctivae normal.  Cardiovascular:     Rate and Rhythm: Rhythm irregular.  Pulmonary:     Breath sounds: Rales present.     Comments: Rales at the bases Abdominal:     General: Bowel sounds are normal.     Palpations: Abdomen is soft.  Musculoskeletal:     Right lower leg: No edema.     Left lower leg: Edema present.     Comments: Hematoma left lower extremity  Skin:    Comments: Scattered ecchymosis  Neurological:     Mental Status: She is alert and oriented to person, place, and time.     Motor: Weakness present.  Psychiatric:        Mood and Affect: Mood normal.        Behavior: Behavior normal.     Data Reviewed: Data Reviewed: Relevant notes from primary care and specialist visits, past discharge summaries as available in EHR, including Care Everywhere. Prior diagnostic testing as pertinent to current admission diagnoses Updated medications and problem lists for reconciliation ED course, including vitals, labs, imaging, treatment and response to treatment Triage notes, nursing and pharmacy notes and ED provider's notes Notable results as noted in HPI Sodium 138, potassium 3.7, chloride 103, bicarb 24, glucose 106, BUN 31, creatinine 1.5 compared to baseline of 1.17, calcium  9.5, total protein  5.6, albumin  2.7, AST 29, ALT 12, alkaline phosphatase 108, proBNP 28,000 847, white count 3.2, hemoglobin 16.6, hematocrit 49.7, platelet count 70 Urinalysis has proteinuria Labs reviewed  Assessment and Plan: * Acute on chronic diastolic CHF (congestive heart failure) (HCC) Patient presents to the emergency room for evaluation of worsening shortness of breath from her baseline as well as worsening bilateral lower extremity swelling and weight gain. Last 2D echocardiogram from 07/25 showed an LVEF of 70 to 75% with mild to moderate aortic valve stenosis Patient's significant weight gain appears to be out of proportion to her diastolic dysfunction and may be related to other factors.  Patient noted to have significant proteinuria ??  Nephrotic syndrome Will place patient on Lasix  40 mg IV twice daily Will continue midodrine  to optimize blood pressure Continue Jardiance  Monitor renal function closely while on diuretics Consult cardiology  Atrial fibrillation (HCC) History of paroxysmal A-fib Heart rate in the low 60s Continue Eliquis  as primary prophylaxis for an acute stroke  Chronic kidney disease, stage 3a (HCC) Monitor renal function closely while on diuretic therapy  COPD  GOLD 2 Continue as needed bronchodilator therapy and inhaled steroids  Coronary artery disease Stable Continue Eliquis   Protein-calorie malnutrition, severe Severe protein calorie malnutrition in the setting of chronic illness Continue Ensure Plus high-protein twice daily Continue MVI and zinc  sulfate  Multiple myeloma (HCC) Patient with a history of multiple myeloma with associated hypoproteinemia resulting in significant lower extremity swelling Consult oncology in a.m.      Advance Care Planning:   Code Status: Limited: Do not attempt resuscitation (DNR) -DNR-LIMITED -Do Not Intubate/DNI    Consults: Cardiology  Family Communication: Plan of care was discussed with patient in detail.  She  verbalizes understanding and agrees to the plan.  CODE STATUS was discussed and she wishes to be DO NOT RESUSCITATE.  She list her daughter is her healthcare power of attorney  Severity of Illness: The appropriate patient status  for this patient is INPATIENT. Inpatient status is judged to be reasonable and necessary in order to provide the required intensity of service to ensure the patient's safety. The patient's presenting symptoms, physical exam findings, and initial radiographic and laboratory data in the context of their chronic comorbidities is felt to place them at high risk for further clinical deterioration. Furthermore, it is not anticipated that the patient will be medically stable for discharge from the hospital within 2 midnights of admission.   * I certify that at the point of admission it is my clinical judgment that the patient will require inpatient hospital care spanning beyond 2 midnights from the point of admission due to high intensity of service, high risk for further deterioration and high frequency of surveillance required.*  Author: Aimee Somerset, MD 06/04/2024 9:48 PM  For on call review [email or web address].

## 2024-06-04 NOTE — ED Notes (Signed)
 Called [facility] to transport the patient to [facility] rm# [id number]

## 2024-06-04 NOTE — Assessment & Plan Note (Signed)
 Severe protein calorie malnutrition in the setting of chronic illness Continue Ensure Plus high-protein twice daily Continue MVI and zinc  sulfate

## 2024-06-04 NOTE — ED Provider Notes (Signed)
 [place] EMERGENCY DEPARTMENT AT [facility] Provider Note   CSN: [id number] Arrival date & time: 06/04/24  1125     Patient presents with: Wound Check   Catherine [name] is a 89 y.o. female.   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  Patient also reports some shortness of breath that has been worsening over the past several days.  Patient had an appointment with her cardiologist this morning.  She was referred to the emergency department for admission for fluid overload.  She has been wearing lower extremity compression.  No fevers or cough.  In addition, patient with recent hematoma to the left popliteal area.  Of note, patient was seen in the emergency department for this 4 days ago.  Plan was to monitor until the area resolved, it broke open and leaked fluid at her appointment this morning.  Currently the localized swelling from this collection is improved.       Prior to Admission medications   Medication Sig Start Date End Date Taking? Authorizing Provider  acetaminophen  (TYLENOL ) 500 MG tablet Take 500 mg by mouth every 6 (six) hours as needed for mild pain (pain score 1-3).    [provider]  albuterol  (PROVENTIL ) (2.5 MG/3ML) 0.083% nebulizer solution Take 3 mLs (2.5 mg total) by nebulization every 6 (six) hours as needed (during COPD Exacerbation). 02/15/24 02/14/25  Hope Almarie ORN, NP  albuterol  (VENTOLIN  HFA) 108 (90 Base) MCG/ACT inhaler INHALE 2 PUFFS INTO THE LUNGS EVERY 4 HOURS AS NEEDED FOR AHEEZING OR SHORTNESS OF BREATH 02/21/24   Parrett, Madelin RAMAN, NP  apixaban  (ELIQUIS ) 2.5 MG TABS tablet TAKE ONE TABLET  TWICE DAILY (START 09/18/23 OR AS DIRECTED) 10/24/23   Burchette, Wolm ORN, MD  bumetanide  (BUMEX ) 1 MG tablet Take 2 tablets (2 mg total) by mouth daily. 05/25/24 06/24/24  Leotis Bogus, MD  cholecalciferol  (VITAMIN D3) 25 MCG (1000 UNIT) tablet Take 1,000 Units by mouth in the morning.    [provider]  diazepam  (VALIUM ) 5 MG tablet TAKE ONE TABLET BY MOUTH AT BEDTIME 12/12/23   Burchette, Wolm ORN, MD  empagliflozin  (JARDIANCE ) 10 MG TABS tablet Take 1 tablet (10 mg total) by mouth daily. Patient not taking: Reported on 06/04/2024 06/01/24   Micheal Wolm ORN, MD  estradiol  (ESTRACE ) 0.1 MG/GM vaginal cream Place 1 Applicatorful vaginally See admin instructions. Apply a small amount vaginally three nights a week on Monday, Wednesday and Friday.    [provider]  feeding supplement (ENSURE PLUS HIGH PROTEIN) LIQD Take 237 mLs by mouth 2 (two) times daily between meals. 04/25/24   Arrien, Mauricio Daniel, MD  guaiFENesin  (MUCINEX ) 600 MG 12 hr tablet Take 1 tablet (600 mg total) by mouth 2 (two) times daily as needed for to loosen phlegm or cough. 04/25/24   Arrien, Mauricio Daniel, MD  HYDROcodone -acetaminophen  (NORCO/VICODIN) 5-325 MG tablet TAKE ONE TABLET EVERY 8 HOURS AS NEEDED FOR PAIN 05/12/24   Burchette, Wolm ORN, MD  midodrine  (PROAMATINE ) 10 MG tablet Take 1 tablet (10 mg total) by mouth 3 (three) times daily with meals. 05/24/24 06/23/24  Leotis Bogus, MD  montelukast  (SINGULAIR ) 5 MG chewable tablet Chew 1 tablet (5 mg total) by mouth at bedtime. 05/24/24 06/23/24  Leotis Bogus, MD  Multiple Vitamins-Minerals (CERTAVITE/ANTIOXIDANTS) TABS Take 1 tablet by mouth daily. Patient not taking: Reported on 06/04/2024    [provider]  ondansetron  (ZOFRAN ) 4 MG tablet Take 1 tablet (4 mg total) by mouth every 8 (eight) hours as needed for nausea or vomiting. Patient not taking: Reported on 06/04/2024 06/01/24   Micheal Wolm ORN, MD  pregabalin  (LYRICA ) 100 MG capsule Take 1  capsule (100 mg total) by mouth at bedtime for 15 days. 05/24/24 06/08/24  Leotis Bogus, MD  sodium chloride  (OCEAN) 0.65 % SOLN nasal spray Place 1 spray into both nostrils as needed for congestion.    [provider]  Zinc  Sulfate 220 (50 Zn) MG TABS Take 1 tablet (220 mg total) by mouth daily. 05/25/24 06/24/24  Leotis Bogus, MD    Allergies: Aldactone  [spironolactone ], Amoxil  Doran.Doe ], Zebeta  [bisoprolol ], Calan  [verapamil ], Crestor  [rosuvastatin ], Flovent  [fluticasone  propionate], Livalo  [pitavastatin ], Lyrica  [pregabalin ], Neurontin [gabapentin], Norvasc  [amlodipine ], Zetia  [ezetimibe ], Zocor  [simvastatin ], Lotensin [benazepril], and Polytrim  [polymyxin b -trimethoprim ]    Review of Systems  Updated Vital Signs There were no vitals taken for this visit.  Physical Exam Vitals and nursing note reviewed.  Constitutional:      General: She is not in acute distress.    Appearance: She is well-developed.  HENT:     Head: Normocephalic and atraumatic.     Right Ear: External ear normal.     Left Ear: External ear normal.     Nose: Nose normal.  Eyes:     Conjunctiva/sclera: Conjunctivae normal.  Cardiovascular:     Rate and Rhythm: Normal rate and regular rhythm.     Pulses: No decreased pulses.     Heart sounds: Murmur heard.     Systolic murmur is present.     Comments: Patient with an ovoid area of ecchymosis and area of now compressed hematoma, left popliteal area.  Patient with otherwise generalized swelling of the bilateral lower extremities.  Skin appears dry.  No active weeping. Pulmonary:     Effort: No respiratory distress.     Breath sounds: No wheezing, rhonchi or rales.  Abdominal:     Palpations: Abdomen is soft.     Tenderness: There is no abdominal tenderness. There is no guarding or rebound.  Musculoskeletal:     Cervical back: Normal range of motion and neck supple.     Right lower leg: Edema present.     Left lower leg: Edema present.  Skin:     General: Skin is warm and dry.     Findings: No rash.  Neurological:     General: No focal deficit present.     Mental Status: She is alert. Mental status is at baseline.     Motor: No weakness.  Psychiatric:        Mood and Affect: Mood normal.     (all labs ordered are listed, but only abnormal results are displayed) Labs Reviewed  CBC - Abnormal; Notable for the following components:      Result Value   WBC 3.2 (*)    RBC 5.38 (*)    Hemoglobin 16.6 (*)    HCT 49.7 (*)    Platelets 70 (*)    All other components within normal limits  COMPREHENSIVE METABOLIC PANEL WITH GFR - Abnormal; Notable for the following components:   Glucose, Bld 106 (*)    BUN 31 (*)    Creatinine, Ser 1.55 (*)    Total Protein 5.6 (*)    Albumin  2.7 (*)  GFR, Estimated 32 (*)    All other components within normal limits  PRO BRAIN NATRIURETIC PEPTIDE - Abnormal; Notable for the following components:   Pro Brain Natriuretic Peptide 28,847.0 (*)    All other components within normal limits  URINALYSIS, ROUTINE W REFLEX MICROSCOPIC    ED ECG REPORT   Date: 06/04/2024  Rate: 64  Rhythm: sinus arrhythmia and premature ventricular contractions (PVC)  QRS Axis: normal  Intervals: normal  ST/T Wave abnormalities: nonspecific T wave changes  Conduction Disutrbances:none  Narrative Interpretation: paced  Old EKG Reviewed: unchanged  I have personally reviewed the EKG tracing and agree with the computerized printout as noted.   Radiology: No results found.   Procedures   Medications Ordered in the ED  furosemide  (LASIX ) injection 40 mg (has no administration in time range)   ED Course  Patient seen and examined. History obtained directly from patient and husband at bedside.  I have also reviewed cardiology notes including clinic note from today and recent hospitalization.  Labs/EKG: Ordered CBC, CMP, BNP  Imaging: Ordered chest x-ray.  Medications/Fluids: Ordered: IV Lasix   Most  recent vital signs reviewed and are as follows: BP (!) 123/52   Pulse 61   Temp 98.1 F (36.7 C) (Oral)   Resp 10   SpO2 97%   Initial impression: Fluid overload, bilateral lower extremity edema worsening  2:06 PM Reassessment performed. Patient appears stable, resting in bed.  Labs personally reviewed and interpreted including: CBC with leukopenia white blood cell count 3.2, hemoglobin actually elevated at 16.6 up from baseline of around 11, thrombocytopenia platelet count 70; CMP elevated creatinine above baseline at 1.55 with a BUN of 31, protein and albumin  marginally low; proBNP markedly elevated 28,000.  Awaiting UA, previous UAs with proteinuria over the past year or so.  Imaging personally visualized and interpreted including: Chest x-ray, stable but does show patchy bilateral airspace opacities and vascular congestion, slight interval decrease in moderate right-sided pleural effusion.  Reviewed pertinent lab work and imaging with patient at bedside. Questions answered.   Most current vital signs reviewed and are as follows: BP (!) 123/52   Pulse 61   Temp 98.1 F (36.7 C) (Oral)   Resp 10   SpO2 97%   Plan: Will need admission to the hospital.  I have ordered IV Lasix  40 mg.  I have reviewed cardiology note from earlier today which recommends admission.  4:03 PM Discussed case with Dr. Cheryle with [facility] who accepts for admission.                                   Medical Decision Making Amount and/or Complexity of Data Reviewed Labs: ordered. Radiology: ordered.  Risk Prescription drug management. Decision regarding hospitalization.   Admit for worsening lower extremity edema, shortness of breath.  Mild AKI today.  Patient did have a hematoma on her left lower extremity that had ruptured earlier today, however no active bleeding I do not suspect that this will be a issue going forward, will need wound care and monitoring.     Final diagnoses:  Bilateral lower  extremity edema  Acute on chronic congestive heart failure, unspecified heart failure type [facility])  Acute kidney injury [facility])    ED Discharge Orders     None          Desiderio Chew, PA-C 06/04/24 1603    Emil Share, DO 06/06/24 [phone]

## 2024-06-04 NOTE — ED Triage Notes (Signed)
 Patient states she was seen by her cardiologist today. States hematoma behind her leg that was evaluated at her last visit here popped while she was at the office. Also states her cardiologist wanted her admitted to the hospital for increasing edema.

## 2024-06-04 NOTE — Assessment & Plan Note (Signed)
 Monitor renal function closely while on diuretic therapy

## 2024-06-04 NOTE — Assessment & Plan Note (Deleted)
 Patient presents to the emergency room for evaluation of worsening shortness of breath from her baseline as well as worsening bilateral lower extremity swelling and weight gain. Last 2D echocardiogram from 07/25 showed an LVEF of 70 to 75% with mild to moderate aortic valve stenosis Patient's significant weight gain appears to be out of proportion to her diastolic dysfunction and may be related to other factors.  Patient noted to have significant proteinuria ??  Nephrotic syndrome Will place patient on Lasix  40 mg IV twice daily Will continue midodrine  to optimize blood pressure Continue Jardiance  Monitor renal function closely while on diuretics Consult cardiology

## 2024-06-04 NOTE — Assessment & Plan Note (Signed)
 History of paroxysmal A-fib Heart rate in the low 60s Continue Eliquis  as primary prophylaxis for an acute stroke

## 2024-06-04 NOTE — Patient Instructions (Signed)
GO TO THE EMERGENCY ROOM FOR EVALUATION.

## 2024-06-04 NOTE — ED Notes (Signed)
 Assisted patient off bedside commode. Changed her wet gown and provided warm blankets. Family at bedside.

## 2024-06-05 ENCOUNTER — Encounter (HOSPITAL_COMMUNITY): Payer: Self-pay

## 2024-06-05 ENCOUNTER — Encounter: Payer: Self-pay | Admitting: *Deleted

## 2024-06-05 ENCOUNTER — Telehealth: Payer: Self-pay | Admitting: *Deleted

## 2024-06-05 ENCOUNTER — Inpatient Hospital Stay: Admitting: Hematology & Oncology

## 2024-06-05 ENCOUNTER — Inpatient Hospital Stay

## 2024-06-05 DIAGNOSIS — I480 Paroxysmal atrial fibrillation: Secondary | ICD-10-CM | POA: Diagnosis not present

## 2024-06-05 DIAGNOSIS — J449 Chronic obstructive pulmonary disease, unspecified: Secondary | ICD-10-CM

## 2024-06-05 DIAGNOSIS — C9000 Multiple myeloma not having achieved remission: Secondary | ICD-10-CM

## 2024-06-05 DIAGNOSIS — R601 Generalized edema: Secondary | ICD-10-CM

## 2024-06-05 DIAGNOSIS — N1831 Chronic kidney disease, stage 3a: Secondary | ICD-10-CM | POA: Diagnosis not present

## 2024-06-05 DIAGNOSIS — I5033 Acute on chronic diastolic (congestive) heart failure: Secondary | ICD-10-CM | POA: Diagnosis not present

## 2024-06-05 DIAGNOSIS — E8581 Light chain (AL) amyloidosis: Secondary | ICD-10-CM

## 2024-06-05 DIAGNOSIS — I509 Heart failure, unspecified: Secondary | ICD-10-CM | POA: Diagnosis not present

## 2024-06-05 DIAGNOSIS — N179 Acute kidney failure, unspecified: Secondary | ICD-10-CM | POA: Diagnosis not present

## 2024-06-05 DIAGNOSIS — I9589 Other hypotension: Secondary | ICD-10-CM

## 2024-06-05 DIAGNOSIS — R600 Localized edema: Secondary | ICD-10-CM | POA: Diagnosis not present

## 2024-06-05 DIAGNOSIS — Z7189 Other specified counseling: Secondary | ICD-10-CM

## 2024-06-05 DIAGNOSIS — Z952 Presence of prosthetic heart valve: Secondary | ICD-10-CM

## 2024-06-05 DIAGNOSIS — Z515 Encounter for palliative care: Secondary | ICD-10-CM

## 2024-06-05 DIAGNOSIS — E43 Unspecified severe protein-calorie malnutrition: Secondary | ICD-10-CM

## 2024-06-05 DIAGNOSIS — I2510 Atherosclerotic heart disease of native coronary artery without angina pectoris: Secondary | ICD-10-CM

## 2024-06-05 LAB — BASIC METABOLIC PANEL WITH GFR
Anion gap: 11 (ref 5–15)
BUN: 28 mg/dL — ABNORMAL HIGH (ref 8–23)
CO2: 23 mmol/L (ref 22–32)
Calcium: 8.8 mg/dL — ABNORMAL LOW (ref 8.9–10.3)
Chloride: 106 mmol/L (ref 98–111)
Creatinine, Ser: 1.37 mg/dL — ABNORMAL HIGH (ref 0.44–1.00)
GFR, Estimated: 38 mL/min — ABNORMAL LOW (ref >60)
Glucose, Bld: 114 mg/dL — ABNORMAL HIGH (ref 70–99)
Potassium: 3.1 mmol/L — ABNORMAL LOW (ref 3.5–5.1)
Sodium: 140 mmol/L (ref 135–145)

## 2024-06-05 LAB — CBC
HCT: 32.8 % — ABNORMAL LOW (ref 36.0–46.0)
Hemoglobin: 10.7 g/dL — ABNORMAL LOW (ref 12.0–15.0)
MCH: 30.3 pg (ref 26.0–34.0)
MCHC: 32.6 g/dL (ref 30.0–36.0)
MCV: 92.9 fL (ref 80.0–100.0)
Platelets: 133 K/uL — ABNORMAL LOW (ref 150–400)
RBC: 3.53 MIL/uL — ABNORMAL LOW (ref 3.87–5.11)
RDW: 13.8 % (ref 11.5–15.5)
WBC: 5.9 K/uL (ref 4.0–10.5)
nRBC: 0 % (ref 0.0–0.2)

## 2024-06-05 MED ORDER — FUROSEMIDE 10 MG/ML IJ SOLN
80.0000 mg | Freq: Two times a day (BID) | INTRAMUSCULAR | Status: DC
  Administered 2024-06-05 – 2024-06-08 (×6): 80 mg via INTRAVENOUS
  Filled 2024-06-05 (×6): qty 8

## 2024-06-05 MED ORDER — AMMONIUM LACTATE 12 % EX LOTN
TOPICAL_LOTION | Freq: Two times a day (BID) | CUTANEOUS | Status: DC
  Administered 2024-06-06 – 2024-06-11 (×3): 1 via TOPICAL
  Filled 2024-06-05: qty 225

## 2024-06-05 MED ORDER — FUROSEMIDE 10 MG/ML IJ SOLN
80.0000 mg | Freq: Two times a day (BID) | INTRAMUSCULAR | Status: DC
Start: 2024-06-05 — End: 2024-06-05

## 2024-06-05 MED ORDER — POTASSIUM CHLORIDE 20 MEQ PO PACK
40.0000 meq | PACK | Freq: Once | ORAL | Status: AC
  Administered 2024-06-05: 40 meq via ORAL
  Filled 2024-06-05: qty 2

## 2024-06-05 MED ORDER — POTASSIUM CHLORIDE CRYS ER 20 MEQ PO TBCR
40.0000 meq | EXTENDED_RELEASE_TABLET | Freq: Once | ORAL | Status: AC
  Administered 2024-06-05: 40 meq via ORAL
  Filled 2024-06-05: qty 2

## 2024-06-05 NOTE — Consult Note (Signed)
 Cardiology Consultation   Patient ID: Catherine [name] MRN: [id number]; DOB: December 27, 1934  Admit date: 06/04/2024 Date of Consult: 06/05/2024  PCP:  Micheal Wolm ORN, MD   [facility] Providers Cardiologist:  Annabella Scarce, MD  Electrophysiologist:  Fonda Kitty, MD     Patient Profile: Catherine [name] is a 89 y.o. 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 Noralee.  History of Present Illness: Catherine [name] is an 89 year old female with above medical history who is followed by Dr. Scarce.  She was previously followed by Dr. Dominick.  She underwent mitral commissurotomy in the 1970s.  Later, echo 05/2016 revealed moderate pulmonary stenosis with moderate to severe mitral regurgitation. She did not have any pulmonary hypertension and her LV was not dilated. She was referred for left and right heart catheterization. She was found to have severe mitral stenosis with a 13 mmHg mean gradient, MVA 0.99 cm^2. She also had 50% LCx and 80% RCA lesions. She had a 29 mm bioprosthetic mitral valve, single vessel CABG (graft to distal RCA) and MAZE with Dr. Dusty on 10/14/16.    In 12/2021, patient was admitted with heart failure and rapid atrial fibrillation.  Started on beta-blocker and amiodarone  therapy.  Later reported side effects with amiodarone . Reported feeling like her hair was falling out, insomnia, and it affected her Coumadin  levels.  Her amiodarone  was ultimately discontinued.  She has a long history of persistent lower extremity edema despite outpatient titration of diuretic therapy.  Was previously unable to tolerate compression hose and has declined vascular surgery evaluation.  She has not been started on an SGLT2  inhibitor due to recurrent UTIs.  Repeat echocardiogram in 10/2022 showed EF 60-65% with moderately reduced RV function, mitral valve prosthesis was appropriately functioning, mild AS with mean gradient 11 mmHg.  Metoprolol  was reduced to bradycardia.  In 08/2023, patient had a dual-chamber pacemaker implanted for symptomatic sinus node dysfunction.  Subsequent device monitoring detected slow VT requiring titration of metoprolol  which was later changed to bisoprolol  due to wheezing and fatigue.  Bisoprolol  was later discontinued due to intolerance.  Patient has also had intolerances to spironolactone , verapamil , amlodipine , statins.  Patient admitted in 02/2024 with heart failure and possible pneumonia.  At follow-up in 03/2024, patient had ongoing lower extremity swelling and was maintained on Bumex  and once weekly metolazone .  EP follow-up in 03/2024 she reported feeling poorly and was found to be in junctional tachycardia.  An ablation was scheduled for 05/2024.  Unfortunately, patient required admission in late 03/2024 due to worsening lower extremity swelling, increased abdominal girth, cough. Echocardiogram from 03/2024 showed EF 70-75%, no regional wall motion abnormalities, mildly reduced RV systolic function, moderately elevated PA systolic pressure, severely dilated left atrium, moderate TR, mild-moderate AAS.  Mitral valve had been repaired/replaced, no evidence of mitral regurgitation.  There was a small echodensity on the chordal apparatus unchanged from 2023.  She was treated with IV diuretics though diuresis was slow and she continued to have issues with volume overload.  It was suspected that this was in part due to low albumin  and significant proteinuria.  Underwent right heart catheterization 05/17/2024 and was found to have mildly elevated filling pressures, moderate pulmonary hypertension, normal cardiac output.  Her GDMT was limited  by hypotension requiring midodrine .  She was discharged on oral  Bumex  2 mg daily, Jardiance  10 mg daily, midodrine  10 mg 3 times daily.  Was not started on beta-blocker due to previous intolerances and recommended that she proceed with SVT ablation in 05/2024.  Of note, during recent admission in 04/2024 patient was noted to have nephrotic range proteinuria.  She had also lost 35 pounds due to loss of appetite.  She was seen by nephrology who recommended oncology evaluation.  She was ultimately diagnosed with multiple myeloma.  She was encouraged to wear compression socks but developed a hematoma that was thought to be caused by compression.  Patient was seen by Dr. Raford on 06/04/2024 in clinic.  At that time, patient noted that her hematoma was wrapped but has since ruptured causing significant leakage.  Had significant swelling in her legs which had worsened since her last hospital stay.  Also reported weight gain, gaining 1-2 pounds daily.  Her breathing was terrible and she was requiring breathing treatments every 4 hours.  This recommended that patient be admitted to the hospital for IV diuresis.  Also recommended wound care and palliative care palliative care evaluation.   On interview, patient reports feeling very poorly.  Reports that her breathing is very poor. Has orthopnea and has a new oxygen  requirement. Also has significant lower extremity swelling. She is a bit frustrated that she has been in the hospital so much recently. Denies chest pain or palpitations. Feels like her stomach is bloated and she has been gaining weight    Past Medical History:  Diagnosis Date   AKI (acute kidney injury) (HCC) 06/22/2022   Amyloidosis (HCC) 05/24/2024   Aortic stenosis, mild 07/26/2017   Arthritis    Asthma    last attack 02/2015   Cellulitis of left lower extremity 08/17/2016   Ulcer associated with severe venous insufficiency   Chronic anticoagulation    Chronic diastolic CHF (congestive heart failure) (HCC)    Chronic kidney disease    RIGHT MANY KIDNEY  INFECTIONS AND STONES   COPD (chronic obstructive pulmonary disease) (HCC)    Coronary artery disease    Dizziness    H/O: rheumatic fever    Hypertension    PAF (paroxysmal atrial fibrillation) (HCC)    PONV (postoperative nausea and vomiting)    ' SOMETIMES', BUT NOT ALWAYS   Primary hypertension    RHEUMATIC MITRAL STENOSIS 01/05/2008   Qualifier: Diagnosis of   By: Neysa MD, Clinton D        S/P Maze operation for atrial fibrillation 10/14/2016   Complete bilateral atrial lesion set using cryothermy and bipolar radiofrequency ablation - atrial appendage was not treated due to previous surgical procedure (open mitral commissurotomy)   S/P mitral valve replacement with bioprosthetic valve 10/14/2016   29 mm Medtronic Mosaic porcine bioprosthetic tissue valve   Sciatica 08/29/2023   Tricuspid regurgitation 07/14/2021   UTI (urinary tract infection)    Valvular heart disease    Has mitral stenosis with prior mitral commissurotomy in 1970    Past Surgical History:  Procedure Laterality Date   ABDOMINAL HYSTERECTOMY  1983   endometriosis   APPENDECTOMY     BACK SURGERY     neurosurgery x2   CARDIAC CATHETERIZATION     CARDIAC CATHETERIZATION N/A 08/03/2016   Procedure: Right/Left Heart Cath and Coronary Angiography;  Surgeon: Peter M Swaziland, MD;  Location: [facility] INVASIVE CV LAB;  Service: Cardiovascular;  Laterality: N/A;   cataract surg  CHOLECYSTECTOMY N/A 05/30/2015   Procedure: LAPAROSCOPIC CHOLECYSTECTOMY WITH INTRAOPERATIVE CHOLANGIOGRAM;  Surgeon: Morene Olives, MD;  Location: [facility] OR;  Service: General;  Laterality: N/A;   COLONOSCOPY     CORONARY ARTERY BYPASS GRAFT N/A 10/14/2016   Procedure: CORONARY ARTERY BYPASS GRAFTING (CABG);  Surgeon: Sudie VEAR Laine, MD;  Location: [facility] OR;  Service: Open Heart Surgery;  Laterality: N/A;   EYE SURGERY     IR BONE MARROW BIOPSY & ASPIRATION  05/21/2024   IR THORACENTESIS ASP PLEURAL SPACE W/IMG GUIDE  04/24/2024   MAZE N/A  10/14/2016   Procedure: MAZE;  Surgeon: Sudie VEAR Laine, MD;  Location: [facility] OR;  Service: Open Heart Surgery;  Laterality: N/A;   MITRAL VALVE REPLACEMENT N/A 10/14/2016   Procedure: REDO MITRAL VALVE REPLACEMENT (MVR);  Surgeon: Sudie VEAR Laine, MD;  Location: [facility] OR;  Service: Open Heart Surgery;  Laterality: N/A;   MITRAL VALVE SURGERY Left 1970   Open mitral commissurotomy via left thoracotomy approach   PACEMAKER IMPLANT N/A 09/22/2023   Procedure: PACEMAKER IMPLANT;  Surgeon: Kennyth Chew, MD;  Location[facility] INVASIVE CV LAB;  Service: Cardiovascular;  Laterality: N/A;   RIGHT HEART CATH N/A 05/17/2024   Procedure: RIGHT HEART CATH;  Surgeon: Darron Deatrice LABOR, MD;  Location: [facility] INVASIVE CV LAB;  Service: Cardiovascular;  Laterality: N/A;   TEE WITHOUT CARDIOVERSION N/A 07/05/2016   Procedure: TRANSESOPHAGEAL ECHOCARDIOGRAM (TEE);  Surgeon: Annabella Scarce, MD;  Location: [facility] ENDOSCOPY;  Service: Cardiovascular;  Laterality: N/A;   TEE WITHOUT CARDIOVERSION N/A 10/14/2016   Procedure: TRANSESOPHAGEAL ECHOCARDIOGRAM (TEE);  Surgeon: Sudie VEAR Laine, MD;  Location: [facility] OR;  Service: Open Heart Surgery;  Laterality: N/A;      Scheduled Meds:  ammonium lactate    Topical BID   apixaban   2.5 mg Oral BID   cholecalciferol   1,000 Units Oral Daily   diazepam   5 mg Oral QHS   empagliflozin   10 mg Oral Daily   [START ON 06/06/2024] estradiol   1 Applicatorful Vaginal Q M,W,F-2000   feeding supplement  237 mL Oral BID BM   furosemide   40 mg Intravenous Q12H   midodrine   10 mg Oral TID WC   montelukast   5 mg Oral QHS   pregabalin   100 mg Oral QHS   Continuous Infusions:  PRN Meds: acetaminophen , albuterol , HYDROcodone -acetaminophen , ondansetron  **OR** ondansetron  (ZOFRAN ) IV, sodium chloride   Allergies:    Allergies  Allergen Reactions   Aldactone  [Spironolactone ] Other (See Comments)    Dyspnea    Amoxil  [Amoxicillin ] Palpitations and Other (See Comments)    Tachycardia   Zebeta   [Bisoprolol ] Shortness Of Breath   Calan  [Verapamil ] Other (See Comments)    Myalgias    Crestor  [Rosuvastatin ] Other (See Comments)    Myalgias    Flovent  [Fluticasone  Propionate] Other (See Comments)    Leg cramps   Livalo  [Pitavastatin ] Other (See Comments)    Myalgias    Lyrica  [Pregabalin ] Swelling   Neurontin [Gabapentin] Swelling   Norvasc  [Amlodipine ] Swelling    Low extremity edema   Zetia  [Ezetimibe ] Other (See Comments)    Myalgias   Zocor  [Simvastatin ] Other (See Comments)    Myalgias    Lotensin [Benazepril] Cough   Polytrim  [Polymyxin B -Trimethoprim ] Swelling    Social History:   Social History   Socioeconomic History   Marital status: Married    Spouse name: Not on file   Number of children: Not on file   Years of education: Not on file   Highest education level: Not on  file  Occupational History   Not on file  Tobacco Use   Smoking status: Former    Current packs/day: 0.00    Average packs/day: 1 pack/day for 15.0 years (15.0 ttl pk-yrs)    Types: Cigarettes    Start date: 09/27/1960    Quit date: 09/28/1975    Years since quitting: 48.7   Smokeless tobacco: Never  Vaping Use   Vaping status: Never Used  Substance and Sexual Activity   Alcohol use: No    Alcohol/week: 0.0 standard drinks of alcohol   Drug use: No   Sexual activity: Yes  Other Topics Concern   Not on file  Social History Narrative   Not on file   Social Drivers of Health   Financial Resource Strain: Low Risk  (04/09/2022)   Overall Financial Resource Strain (CARDIA)    Difficulty of Paying Living Expenses: Not hard at all  Food Insecurity: No Food Insecurity (06/04/2024)   Hunger Vital Sign    Worried About Running Out of Food in the Last Year: Never true    Ran Out of Food in the Last Year: Never true  Transportation Needs: No Transportation Needs (06/04/2024)   PRAPARE - Administrator, Civil Service (Medical): No    Lack of Transportation (Non-Medical): No   Physical Activity: Insufficiently Active (04/09/2022)   Exercise Vital Sign    Days of Exercise per Week: 5 days    Minutes of Exercise per Session: 20 min  Stress: No Stress Concern Present (04/09/2022)   [employer] of Occupational Health - Occupational Stress Questionnaire    Feeling of Stress : Not at all  Social Connections: Socially Integrated (06/04/2024)   Social Connection and Isolation Panel    Frequency of Communication with Friends and Family: More than three times a week    Frequency of Social Gatherings with Friends and Family: More than three times a week    Attends Religious Services: More than 4 times per year    Active Member of [employer] or Organizations: Yes    Attends Engineer, structural: More than 4 times per year    Marital Status: Married  Catering manager Violence: Not At Risk (06/04/2024)   Humiliation, Afraid, Rape, and Kick questionnaire    Fear of Current or Ex-Partner: No    Emotionally Abused: No    Physically Abused: No    Sexually Abused: No    Family History:    Family History  Problem Relation Age of Onset   Leukemia Father    Breast cancer Neg Hx      ROS:  Please see the history of present illness.   All other ROS reviewed and negative.     Physical Exam/Data: Vitals:   06/04/24 2043 06/04/24 2307 06/05/24 0600 06/05/24 0740  BP: (!) 104/94 (!) 143/64 (!) 126/53 (!) 128/57  Pulse:  69 60 60  Resp: (!) 21 (!) 21  20  Temp: 98.2 F (36.8 C) 97.6 F (36.4 C) (!) 97.4 F (36.3 C) (!) 97.5 F (36.4 C)  TempSrc: Oral Oral Oral Oral  SpO2: 95% 99% 97% 100%  Weight:   59.6 kg   Height:        Intake/Output Summary (Last 24 hours) at 06/05/2024 0952 Last data filed at 06/05/2024 9167 Gross per 24 hour  Intake 480 ml  Output 600 ml  Net -120 ml      06/05/2024    6:00 AM 06/04/2024    8:40 PM 06/04/2024  10:30 AM  Last 3 Weights  Weight (lbs) 131 lb 6.3 oz 137 lb 2 oz 139 lb  Weight (kg) 59.6 kg 62.2 kg 63.05 kg     Body  mass index is 23.28 kg/m.  General:  Frail elderly female sitting upright in the bed  HEENT: normal Neck: no JVD Vascular: Radial pulses 2+ bilaterally Cardiac:  normal S1, S2; RRR;Grade 2/6 systolic murmur  Lungs:  Diminished breath sounds in bilateral lung bases. Increased WOB while speaking  Abd: soft, nontender, no hepatomegaly  Ext: 2+ edema Musculoskeletal:  No deformities Skin: warm and dry  Neuro:  CNs 2-12 intact, no focal abnormalities noted Psych:  Normal affect   EKG:  The EKG was personally reviewed and demonstrates:  atrial fibrillation with HR 64 BPM,  Telemetry:  Telemetry was personally reviewed and demonstrates:  atrial fibrillation, V-pacing   Relevant CV Studies:  Cardiac Studies & Procedures   ______________________________________________________________________________________________ CARDIAC CATHETERIZATION  CARDIAC CATHETERIZATION 05/17/2024  Conclusion Right heart catheterization via the right brachial vein showed mildly elevated filling pressures, moderate pulmonary hypertension and normal cardiac output.  RA: 8 mmHg, RV 51/2 mmHg, PW: 18 mmHg, PA: 50/10 with a mean of 28 mmHg, cardiac output is 4.64 with an index of 2.92.  Recommendations: She can likely be switched to an oral diuretic tomorrow.     ECHOCARDIOGRAM  ECHOCARDIOGRAM COMPLETE 04/24/2024  Narrative ECHOCARDIOGRAM REPORT    Patient Name:   Catherine [name] Date of Exam: 04/24/2024 Medical Rec #:  [id number]      Height:       63.0 in Accession #:    [id number]     Weight:       115.0 lb Date of Birth:  Jan 23, 1935     BSA:          1.528 m Patient Age:    88 years       BP:           139/64 mmHg Patient Gender: F              HR:           61 bpm. Exam Location:  Inpatient  Procedure: 2D Echo, Cardiac Doppler and Color Doppler (Both Spectral and Color Flow Doppler were utilized during procedure).  Indications:    CHF-Acute Diastolic I50.31  History:        Patient has prior  history of Echocardiogram examinations, most recent 11/02/2023. CHF, CAD, Prior CABG, COPD and CKD, stage 3, Arrythmias:Atrial Fibrillation, Signs/Symptoms:Dyspnea; Risk Factors:Hypertension and Dyslipidemia.  Mitral Valve: 29 mm Medtronic bioprosthetic valve valve is present in the mitral position. Procedure Date: 10/14/2016.  Sonographer:    Thea Norlander RCS Referring Phys: [id number] PAULA V ROSS  IMPRESSIONS   1. No resting LVOT gradient. Left ventricular ejection fraction, by estimation, is 70 to 75%. The left ventricle has hyperdynamic function. The left ventricle has no regional wall motion abnormalities. Left ventricular diastolic parameters are indeterminate. 2. Right ventricular systolic function is mildly reduced. The right ventricular size is normal. There is moderately elevated pulmonary artery systolic pressure. The estimated right ventricular systolic pressure is 50.5 mmHg. 3. Left atrial size was severely dilated. 4. Right atrial size was mildly dilated. 5. Small, echodensity noted on the chordal apparatus unchanged from 2023 PLAX view. There new LVOT is small related to valve struts with LVOT flow acceleration. The mitral valve has been repaired/replaced. No evidence of mitral valve regurgitation. The mean mitral valve gradient is 8.0 mmHg. There is  a 29 mm Medtronic bioprosthetic valve present in the mitral position. Procedure Date: 10/14/2016. 6. Tricuspid valve regurgitation is moderate. 7. The aortic valve is tricuspid. Aortic valve regurgitation is not visualized. Mild to moderate aortic valve stenosis. Aortic valve area, by VTI measures 1.04 cm. Aortic valve mean gradient measures 19.0 mmHg. Aortic valve Vmax measures 3.02 m/s. 8. The inferior vena cava is dilated in size with <50% respiratory variability, suggesting right atrial pressure of 15 mmHg.  Comparison(s): Prior images reviewed side by side. Mitral gradients have increased from 2023 with similar heart rates (60s);  aortic valve gradients have increased; a portion of this is related to dynamic LV function.  FINDINGS Left Ventricle: No resting LVOT gradient. Left ventricular ejection fraction, by estimation, is 70 to 75%. The left ventricle has hyperdynamic function. The left ventricle has no regional wall motion abnormalities. The left ventricular internal cavity size was normal in size. There is no left ventricular hypertrophy. Left ventricular diastolic parameters are indeterminate.  Right Ventricle: The right ventricular size is normal. No increase in right ventricular wall thickness. Right ventricular systolic function is mildly reduced. There is moderately elevated pulmonary artery systolic pressure. The tricuspid regurgitant velocity is 2.98 m/s, and with an assumed right atrial pressure of 15 mmHg, the estimated right ventricular systolic pressure is 50.5 mmHg.  Left Atrium: Left atrial size was severely dilated.  Right Atrium: Right atrial size was mildly dilated.  Pericardium: There is no evidence of pericardial effusion.  Mitral Valve: Small, echodensity noted on the chordal apparatus unchanged from 2023 PLAX view. There new LVOT is small related to valve struts with LVOT flow acceleration. The mitral valve has been repaired/replaced. No evidence of mitral valve regurgitation. There is a 29 mm Medtronic bioprosthetic valve present in the mitral position. Procedure Date: 10/14/2016. MV peak gradient, 23.7 mmHg. The mean mitral valve gradient is 8.0 mmHg with average heart rate of 60 bpm.  Tricuspid Valve: The tricuspid valve is normal in structure. Tricuspid valve regurgitation is moderate . No evidence of tricuspid stenosis.  Aortic Valve: The aortic valve is tricuspid. Aortic valve regurgitation is not visualized. Mild to moderate aortic stenosis is present. Aortic valve mean gradient measures 19.0 mmHg. Aortic valve peak gradient measures 36.5 mmHg. Aortic valve area, by VTI measures 1.04  cm.  Pulmonic Valve: The pulmonic valve was normal in structure. Pulmonic valve regurgitation is trivial. No evidence of pulmonic stenosis.  Aorta: The aortic root and ascending aorta are structurally normal, with no evidence of dilitation.  Venous: The inferior vena cava is dilated in size with less than 50% respiratory variability, suggesting right atrial pressure of 15 mmHg.  IAS/Shunts: The atrial septum is grossly normal.   LEFT VENTRICLE PLAX 2D LVIDd:         4.57 cm   Diastology LVIDs:         3.08 cm   LV e' medial:    6.16 cm/s LV PW:         0.64 cm   LV E/e' medial:  32.3 LV IVS:        0.93 cm   LV e' lateral:   8.92 cm/s LVOT diam:     1.60 cm   LV E/e' lateral: 22.3 LV SV:         63 LV SV Index:   41 LVOT Area:     2.01 cm   RIGHT VENTRICLE            IVC RV S prime:  7.22 cm/s  IVC diam: 2.27 cm TAPSE (M-mode): 1.6 cm  LEFT ATRIUM              Index        RIGHT ATRIUM           Index LA diam:        5.80 cm  3.79 cm/m   RA Area:     21.15 cm LA Vol (A2C):   97.8 ml  63.99 ml/m  RA Volume:   59.55 ml  38.96 ml/m LA Vol (A4C):   119.0 ml 77.86 ml/m LA Biplane Vol: 108.0 ml 70.66 ml/m AORTIC VALVE AV Area (Vmax):    1.09 cm AV Area (Vmean):   1.05 cm AV Area (VTI):     1.04 cm AV Vmax:           302.00 cm/s AV Vmean:          198.000 cm/s AV VTI:            0.604 m AV Peak Grad:      36.5 mmHg AV Mean Grad:      19.0 mmHg LVOT Vmax:         164.00 cm/s LVOT Vmean:        103.000 cm/s LVOT VTI:          0.313 m LVOT/AV VTI ratio: 0.52  AORTA Ao Root diam: 3.15 cm Ao Asc diam:  3.50 cm  MITRAL VALVE                TRICUSPID VALVE MV Area (PHT): 2.34 cm     TR Peak grad:   35.5 mmHg MV Area VTI:   0.94 cm     TR Vmax:        298.00 cm/s MV Peak grad:  23.7 mmHg MV Mean grad:  8.0 mmHg     SHUNTS MV Vmax:       2.44 m/s     Systemic VTI:  0.31 m MV Vmean:      110.0 cm/s   Systemic Diam: 1.60 cm MV Decel Time: 324 msec MV E  velocity: 199.00 cm/s  Stanly Leavens MD Electronically signed by Stanly Leavens MD Signature Date/Time: 04/24/2024/5:19:23 PM    Final    MONITORS  LONG TERM MONITOR-LIVE TELEMETRY (3-14 DAYS) 10/10/2023  Narrative 7 Day Zio Monitor  Quality: Fair.  Baseline artifact. Predominant rhythm: Sinus arrhythmia with long first degree AV block, junctional rhythm Average heart rate: 59 bpm Max heart rate: 179 bpm Min heart rate: 27 bpm Pauses >2.5 seconds: Sinus arrest lasting up to 6.9 seconds  469 episodes of SVT lasting up to 1 hour 32 seconds Rare (<1%) PVCs and PACs.   Tiffany C. Raford, MD, [facility] 01/02/2024 5:11 PM       ______________________________________________________________________________________________       Laboratory Data: High Sensitivity Troponin:   Recent Labs  Lab 05/14/24 2055 05/15/24 0007 05/15/24 0449 05/15/24 0545 05/15/24 1101  TROPONINIHS 159* 158* 181* 187* 189*     Chemistry Recent Labs  Lab 06/04/24 1305 06/05/24 0328  NA 138 140  K 3.7 3.1*  CL 103 106  CO2 24 23  GLUCOSE 106* 114*  BUN 31* 28*  CREATININE 1.55* 1.37*  CALCIUM  9.5 8.8*  GFRNONAA 32* 38*  ANIONGAP 12 11    Recent Labs  Lab 06/04/24 1305  PROT 5.6*  ALBUMIN  2.7*  AST 29  ALT 12  ALKPHOS 108  BILITOT 0.3   Lipids No  results for input(s): CHOL, TRIG, HDL, LABVLDL, LDLCALC, CHOLHDL in the last 168 hours.  Hematology Recent Labs  Lab 06/04/24 1305 06/05/24 0328  WBC 3.2* 5.9  RBC 5.38* 3.53*  HGB 16.6* 10.7*  HCT 49.7* 32.8*  MCV 92.4 92.9  MCH 30.9 30.3  MCHC 33.4 32.6  RDW 14.2 13.8  PLT 70* 133*   Thyroid  No results for input(s): TSH, FREET4 in the last 168 hours.  BNP Recent Labs  Lab 06/04/24 1305  PROBNP 28,847.0*    DDimer No results for input(s): DDIMER in the last 168 hours.  Radiology/Studies:  DG Chest 2 View Result Date: 06/04/2024 CLINICAL DATA:  Leg edema EXAM: CHEST - 2 VIEW COMPARISON:   Chest radiograph May 22, 2024 FINDINGS: Interval slight decrease in moderate right-sided pleural effusion. Small left pleural effusion is stable to prior. Patchy bilateral airspace opacities and vascular congestion are grossly similar to prior. Ground-glass attenuation of right lung base is improved to prior. Status post CABG. Dual lead pacer in place. No acute osseous abnormality. IMPRESSION: Interval slight decrease in moderate right-sided pleural effusion. Stable small left pleural effusion. Slight improvement of right lung base ground-glass attenuation/atelectasis. Electronically Signed   By: Megan  Zare M.D.   On: 06/04/2024 13:44     Assessment and Plan:  Acute on chronic HFpEF  Lower extremity swelling, weight gain - Patient has a long history of lower extremity swelling which has been difficult to control with diuresis.  When admitted in 04/2024, patient also found to have nephrotic range proteinuria. She was encouraged to wear compression socks.  Later developed a hematoma thought to be caused by the compression and has been doing lower extremity wrapping. -Most recent echocardiogram from 7/29 showed EF 70-75%, no regional wall motion abnormalities, mildly reduced RV systolic function, severe left atrial dilation - Was seen by Dr. Raford in clinic yesterday.  Patient reported significant weight gain and lower extremity swelling despite treatment with diuretics.  Per Dr. Raford, felt that her swelling is out of proportion to her diastolic dysfunction.  Likely that other factors are contributing. - Yesterday proBNP elevated to 28,847.  Chest x-ray with moderate right sided pleural effusion, stable small left pleural effusion - Currently on Lasix  40 mg twice daily.  Output 0.6 L urine yesterday.  Creatinine stable at 1.37. Continues to be volume up on exam  - Increase lasix  to IV 80 mg BID  - Follow strict I/Os, daily weights. K 3.1 this AM, has received 80 mEq supplementation. Repeat BMP  in AM   - Continue jardiance  10 mg daily  - Patient has had multiple admissions for CHF, lower extremity swelling. Recently diagnosed with multiple myeloma. Dr. Raford recommended palliative care consult. I discussed this with patient who was in agreement   Paroxysmal atrial fibrillation  Symptomatic bradycardia s/p PPM  Junctional Tachycardia  - Device interrogation from 04/12/24 showed episodes of junctional tachycardia, <1% burden of atrial fibrillation. Was very symptomatic when in junctinoal tachycardia  - In the past, patient did not tolerate BB. Recommended ablation which was planned for 05/2024, but was cancelled   - Continue eliquis  2.5 mg BID   CAD  - Previously had CABGx1 with graft to distal RCA in 2018  - Denies chest pain  - Previously did not tolerate statins, beta-blockers  - Not on aspirin  due to eliquis  use   S/p MVR  Aortic stenosis  - Echocardiogram 7/29 showed mild-moderate AS. No mitral valve regurgitation, mean gradient 8/0 mmHg   Hypotension  -  Continue midodrine  for BP support   Otherwise per primary  - CKD stage IIIa - Protein calorie malnutrition  - Recently diagnosed multiple myeloma  - COPD     Risk Assessment/Risk Scores:   New York  Heart Association (NYHA) Functional Class NYHA Class III  CHA2DS2-VASc Score = 6   This indicates a 9.7% annual risk of stroke. The patient's score is based upon: CHF History: 1 HTN History: 1 Diabetes History: 0 Stroke History: 0 Vascular Disease History: 1 Age Score: 2 Gender Score: 1  For questions or updates, please contact [facility] Please consult [email or web address] for contact info under    Signed, Rollo FABIENE Louder, PA-C  06/05/2024 9:52 AM

## 2024-06-05 NOTE — Progress Notes (Signed)
 TRH night cross cover note:   I was notified by the patient's RN of the patient's updated potassium level this morning at 3.1.  I subsequently ordered potassium chloride  40 meq p.o. x 1 dose now.     Eva Pore, DO Hospitalist

## 2024-06-05 NOTE — Telephone Encounter (Signed)
 Copied from CRM [id number]. Topic: General - Other >> Jun 04, 2024 11:47 AM Gennette ORN wrote: Reason for CRM: Medford from [facility] [phone] wants to move the evaluation 9/14-9/20. >> Jun 05, 2024 11:29 AM Chiquita SQUIBB wrote: Medford with [facility] is calling in requesting a follow up on this message, please advise. Okay to leave a VM

## 2024-06-05 NOTE — TOC Initial Note (Signed)
 Transition of Care [facility]) - Initial/Assessment Note    Patient Details  Name: AMEKA KRIGBAUM MRN: [id number] Date of Birth: [birth year]  [facility] CM/SW Contact:    Waddell Barnie Rama, RN Phone Number: 06/05/2024, 3:06 PM  Clinical Narrative:                 From home with spouse, has PCP and insurance on file, states has  services in place at this time with [facility], has neb machine, has canes and walker that are old from her parents. at home.  States family member ( daughter)  will transport them home at [employer] and family is support system, states gets medications from [employer].  Pta self ambulatory.   NCM confirmed with Amy with [facility] that patient is active for HHPT, HHOT.  Await pt eval.   Expected Discharge Plan: Home w Home Health Services Barriers to Discharge: Continued Medical Work up   Patient Goals and CMS Choice Patient states their goals for this hospitalization and ongoing recovery are:: return home CMS Medicare.gov Compare Post Acute Care list provided to:: Patient Choice offered to / list presented to : Patient      Expected Discharge Plan and Services In-house Referral: NA Discharge Planning Services: CM Consult Post Acute Care Choice: Resumption of Svcs/PTA Provider, Home Health Living arrangements for the past 2 months: Single Family Home                 DME Arranged: N/A DME Agency: NA       HH Arranged: PT, OT HH Agency: [facility] Date HH Agency Contacted: 06/05/24 Time HH Agency Contacted: 1505 Representative spoke with at [facility] Agency: Amy  Prior Living Arrangements/Services Living arrangements for the past 2 months: Single Family Home Lives with:: Spouse Patient language and need for interpreter reviewed:: Yes Do you feel safe going back to the place where you live?: Yes      Need for Family Participation in Patient Care: Yes (Comment) Care giver support system in place?: Yes (comment) Current home services: DME  (neb machine, has canes and walker that are old from her parents.) Criminal Activity/Legal Involvement Pertinent to Current Situation/Hospitalization: No - Comment as needed  Activities of Daily Living   ADL Screening (condition at time of admission) Independently performs ADLs?: Yes (appropriate for developmental age) Is the patient deaf or have difficulty hearing?: No Does the patient have difficulty seeing, even when wearing glasses/contacts?: No Does the patient have difficulty concentrating, remembering, or making decisions?: No  Permission Sought/Granted Permission sought to share information with : Case Manager Permission granted to share information with : Yes, Verbal Permission Granted     Permission granted to share info w AGENCY: HH        Emotional Assessment Appearance:: Appears stated age Attitude/Demeanor/Rapport: Engaged Affect (typically observed): Appropriate Orientation: : Oriented to Self, Oriented to Place, Oriented to  Time, Oriented to Situation Alcohol / Substance Use: Not Applicable Psych Involvement: No (comment)  Admission diagnosis:  CHF exacerbation (HCC) [I50.9] Acute kidney injury (HCC) [N17.9] Bilateral lower extremity edema [R60.0] Acute on chronic congestive heart failure, unspecified heart failure type [facility]) [I50.9] Patient Active Problem List   Diagnosis Date Noted   CHF exacerbation (HCC) 06/04/2024   Multiple myeloma (HCC) 06/04/2024   Amyloidosis (HCC) 05/24/2024   Persistent proteinuria 05/17/2024   Pleural effusion due to CHF (congestive heart failure) (HCC) 05/14/2024   Essential hypertension 04/21/2024   Chronic venous hypertension (idiopathic) with ulcer and  inflammation of left lower extremity (HCC) 04/18/2024   Acute on chronic diastolic CHF (congestive heart failure) (HCC) 04/15/2024   CAP (community acquired pneumonia) 03/26/2024   Hyperkalemia 03/26/2024   Peripheral edema 03/26/2024   Acute hypoxemic respiratory failure  (HCC) 03/24/2024   Sick sinus syndrome (HCC) 09/21/2023   Sinus pause 09/13/2023   Chronic anticoagulation 09/13/2023   S/P mitral valve replacement with bioprosthetic valve - Oct 14, 2016 09/12/2023   Chronic diastolic CHF (congestive heart failure) (HCC) 09/12/2023   Chronic kidney disease, stage 3a (HCC) 09/12/2023   Hypokalemia 09/12/2023   Pneumothorax on left 09/10/2023   Statin intolerance 09/08/2023   Sciatica 08/29/2023   Chronic low back pain 08/03/2022   AKI (acute kidney injury) (HCC) 06/22/2022   COPD  GOLD 2 03/02/2022   Acute on chronic heart failure with preserved ejection fraction (HFpEF) (HCC) 01/05/2022   Tricuspid regurgitation 07/14/2021   Protein-calorie malnutrition, severe 09/05/2019   Enteritis 09/03/2019   COVID-19 virus infection 08/27/2019   Dysuria 07/07/2019   Chronic anxiety 12/23/2017   Aortic stenosis, mild 07/26/2017   S/P Maze operation for atrial fibrillation 10/14/2016   S/P CABG x 1 10/14/2016   Coronary artery disease    Chronic insomnia 07/12/2016   DOE (dyspnea on exertion) 03/26/2015   Atrial fibrillation, chronic (HCC) 03/16/2015   Valvular heart disease 03/16/2015   Nephrolithiasis 03/16/2015   Primary hypertension    Hyperlipidemia LDL goal <70 04/28/2011   Rheumatic heart disease 01/05/2008   Atrial fibrillation (HCC) 01/05/2008   PCP:  Micheal Wolm ORN, MD Pharmacy:   [facility] [place], [place] - [address] [place] [address] Phone: [phone] Fax: [phone]  [facility] [address] [place] [address] Phone: [phone] Fax: [phone]  [facility] [place], [place] - [address] [place] [address] Phone: [phone] Fax: [phone]  [facility] [place], [place] - [address] [place] [address] Phone: [phone] Fax: [phone]  [facility]  - [facility] [address] [place] [address] Phone: [phone] Fax: [phone]     Social Drivers of Health (SDOH) Social History: SDOH Screenings   Food Insecurity: No Food Insecurity (06/04/2024)  Housing: Low Risk  (06/04/2024)  Transportation Needs: No Transportation Needs (06/04/2024)  Utilities: Not At Risk (06/04/2024)  Alcohol Screen: Low Risk  (11/06/2021)  Depression (PHQ2-9): Low Risk  (05/25/2024)  Recent Concern: Depression (PHQ2-9) - Medium Risk (04/26/2024)  Financial Resource Strain: Low Risk  (04/09/2022)  Physical Activity: Insufficiently Active (04/09/2022)  Social Connections: Socially Integrated (06/04/2024)  Stress: No Stress Concern Present (04/09/2022)  Tobacco Use: Medium Risk (06/04/2024)   SDOH Interventions:     Readmission Risk Interventions    06/05/2024    2:59 PM 05/22/2024    3:49 PM 04/24/2024   12:04 PM  Readmission Risk Prevention Plan  Transportation Screening Complete Complete Complete  PCP or Specialist Appt within 3-5 Days   Complete  HRI or Home Care Consult   Complete  Social Work Consult for Recovery Care Planning/Counseling   Complete  Palliative Care Screening   Not Applicable  Medication Review Oceanographer) Complete Referral to Pharmacy Referral to Pharmacy  PCP or Specialist appointment within 3-5 days of discharge Complete    HRI or Home Care Consult Complete Complete   SW Recovery Care/Counseling Consult  Complete   Palliative Care Screening -- Not Applicable   Skilled Nursing Facility Not Applicable Not Applicable

## 2024-06-05 NOTE — Progress Notes (Signed)
 PROGRESS NOTE    Catherine [name]  FMW:[id number] DOB: 1935-06-05 DOA: 06/04/2024 PCP: Micheal Wolm ORN, MD   88/F w chronic atrial fibrillation, hypertension, sp bioprosthetic MVR, CAD CABG, stage IIIa chronic kidney disease, COPD and recently diagnosed multiple myeloma who was sent to the hospital by her cardiologist for worsening bilateral lower extremity swelling, dyspnea. She also complains of a hematoma in her left leg. fourth hospitalization in the last 2 months Abnormal labs include BUN of 31, creatinine of 1.55 compared to baseline of 1.17, proBNP 28,000 847, platelet count 70, hemoglobin 16.6, hematocrit 49.7 Chest x-ray reviewed by me shows bilateral pleural effusions with interval slight decrease in moderate right-sided pleural effusion. Stable small left pleural effusion,   Subjective: Spoke to Dr.Ennever, thinking abt Rx for Myeloma/Amyloid  Assessment and Plan:   Acute on chronic diastolic CHF (congestive heart failure) (HCC) Last 2D echocardiogram from 07/25 showed an LVEF of 70 to 75% with mild to moderate aortic valve stenosis -likely hypoalbuminemia contributing  -continue lasix  80mg  BID, jardiance , midodrine  -appreciate Cards eval -Poor prognosis, Palliative following  P Atrial fibrillation (HCC) -continue eliquis   Chronic kidney disease, stage 3a (HCC) stable  COPD  GOLD 2 Continue as needed bronchodilator therapy and inhaled steroids  Coronary artery disease Stable Continue Eliquis   Protein-calorie malnutrition, severe Severe protein calorie malnutrition in the setting of chronic illness Continue Ensure Plus high-protein twice daily Continue MVI and zinc  sulfate  Multiple myeloma (HCC)/Amyloidosis -poor prognosis. Dr.Ennever following, considering Rx    DVT prophylaxis: apixaban  Code Status: DNR Family Communication: none present Disposition Plan:   Consultants:    Procedures:   Antimicrobials:    Objective: Vitals:   06/04/24 2307  06/05/24 0600 06/05/24 0740 06/05/24 1020  BP: (!) 143/64 (!) 126/53 (!) 128/57 (!) 129/58  Pulse: 69 60 60 100  Resp: (!) 21  20 20   Temp: 97.6 F (36.4 C) (!) 97.4 F (36.3 C) (!) 97.5 F (36.4 C)   TempSrc: Oral Oral Oral   SpO2: 99% 97% 100% 99%  Weight:  59.6 kg    Height:        Intake/Output Summary (Last 24 hours) at 06/05/2024 1357 Last data filed at 06/05/2024 1134 Gross per 24 hour  Intake 480 ml  Output 1000 ml  Net -520 ml   Filed Weights   06/04/24 2040 06/05/24 0600  Weight: 62.2 kg 59.6 kg    Examination:  Gen: Awake, Alert, Oriented X 3,  HEENT: + JVD Lungs: decreased BS at bases CVS: S1S2/RRR Abd: soft, Non tender, non distended, BS present Extremities: 2plus edema, L calf hematoma Skin: as above  Data Reviewed:   CBC: Recent Labs  Lab 06/04/24 1305 06/05/24 0328  WBC 3.2* 5.9  HGB 16.6* 10.7*  HCT 49.7* 32.8*  MCV 92.4 92.9  PLT 70* 133*   Basic Metabolic Panel: Recent Labs  Lab 06/04/24 1305 06/05/24 0328  NA 138 140  K 3.7 3.1*  CL 103 106  CO2 24 23  GLUCOSE 106* 114*  BUN 31* 28*  CREATININE 1.55* 1.37*  CALCIUM  9.5 8.8*   GFR: Estimated Creatinine Clearance: 24.4 mL/min (A) (by C-G formula based on SCr of 1.37 mg/dL (H)). Liver Function Tests: Recent Labs  Lab 06/04/24 1305  AST 29  ALT 12  ALKPHOS 108  BILITOT 0.3  PROT 5.6*  ALBUMIN  2.7*   No results for input(s): LIPASE, AMYLASE in the last 168 hours. No results for input(s): AMMONIA in the last 168 hours. Coagulation Profile: No  results for input(s): INR, PROTIME in the last 168 hours. Cardiac Enzymes: No results for input(s): CKTOTAL, CKMB, CKMBINDEX, TROPONINI in the last 168 hours. BNP (last 3 results) Recent Labs    04/15/24 1604 05/14/24 0731 06/04/24 1305  PROBNP 14,671.0* 15,910.0* 28,847.0*   HbA1C: No results for input(s): HGBA1C in the last 72 hours. CBG: No results for input(s): GLUCAP in the last 168 hours. Lipid  Profile: No results for input(s): CHOL, HDL, LDLCALC, TRIG, CHOLHDL, LDLDIRECT in the last 72 hours. Thyroid  Function Tests: No results for input(s): TSH, T4TOTAL, FREET4, T3FREE, THYROIDAB in the last 72 hours. Anemia Panel: No results for input(s): VITAMINB12, FOLATE, FERRITIN, TIBC, IRON , RETICCTPCT in the last 72 hours. Urine analysis:    Component Value Date/Time   COLORURINE YELLOW 06/04/2024 1505   APPEARANCEUR CLEAR 06/04/2024 1505   APPEARANCEUR Cloudy (A) 07/22/2022 1513   LABSPEC 1.009 06/04/2024 1505   PHURINE 6.0 06/04/2024 1505   GLUCOSEU NEGATIVE 06/04/2024 1505   HGBUR SMALL (A) 06/04/2024 1505   BILIRUBINUR NEGATIVE 06/04/2024 1505   BILIRUBINUR positive 07/11/2023 1453   BILIRUBINUR Negative 07/22/2022 1513   KETONESUR NEGATIVE 06/04/2024 1505   PROTEINUR >300 (A) 06/04/2024 1505   UROBILINOGEN 1.0 07/11/2023 1453   UROBILINOGEN 0.2 03/15/2015 2151   NITRITE NEGATIVE 06/04/2024 1505   LEUKOCYTESUR NEGATIVE 06/04/2024 1505   Sepsis Labs: @LABRCNTIP (procalcitonin:4,lacticidven:4)  )No results found for this or any previous visit (from the past 240 hours).   Radiology Studies: DG Chest 2 View Result Date: 06/04/2024 CLINICAL DATA:  Leg edema EXAM: CHEST - 2 VIEW COMPARISON:  Chest radiograph May 22, 2024 FINDINGS: Interval slight decrease in moderate right-sided pleural effusion. Small left pleural effusion is stable to prior. Patchy bilateral airspace opacities and vascular congestion are grossly similar to prior. Ground-glass attenuation of right lung base is improved to prior. Status post CABG. Dual lead pacer in place. No acute osseous abnormality. IMPRESSION: Interval slight decrease in moderate right-sided pleural effusion. Stable small left pleural effusion. Slight improvement of right lung base ground-glass attenuation/atelectasis. Electronically Signed   By: Megan  Zare M.D.   On: 06/04/2024 13:44     Scheduled Meds:   ammonium lactate    Topical BID   apixaban   2.5 mg Oral BID   cholecalciferol   1,000 Units Oral Daily   diazepam   5 mg Oral QHS   empagliflozin   10 mg Oral Daily   [START ON 06/06/2024] estradiol   1 Applicatorful Vaginal Q M,W,F-2000   feeding supplement  237 mL Oral BID BM   furosemide   80 mg Intravenous Q12H   midodrine   10 mg Oral TID WC   montelukast   5 mg Oral QHS   pregabalin   100 mg Oral QHS   Continuous Infusions:   LOS: 1 day    Time spent:    Sigurd Pac, MD [facility]   06/05/2024, 1:57 PM
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
## 2024-06-05 NOTE — Plan of Care (Signed)

## 2024-06-05 NOTE — Plan of Care (Signed)
   Problem: Education: Goal: Knowledge of General Education information will improve Description: Including pain rating scale, medication(s)/side effects and non-pharmacologic comfort measures Outcome: Progressing   Problem: Clinical Measurements: Goal: Will remain free from infection Outcome: Progressing   Problem: Clinical Measurements: Goal: Respiratory complications will improve Outcome: Progressing

## 2024-06-05 NOTE — Progress Notes (Signed)
 Heart Failure Navigator Progress Note  Assessed for Heart & Vascular TOC clinic readiness.  Patient does not meet criteria due to EF 70-75%. No HF TOC per Dr. Noralee. .   Navigator will sign off at this time.   Stephane Haddock, BSN, Scientist, clinical (histocompatibility and immunogenetics) Only

## 2024-06-05 NOTE — Progress Notes (Signed)
 Patient scheduled for new patient appointment today, however she was admitted to [facility] yesterday evening with cardiac concerns. Notified Dr Timmy and cancelled today's appointment.  Will continue to follow for post discharge needs and office follow up.   Oncology Nurse Navigator Documentation     06/05/2024    8:00 AM  Oncology Nurse Navigator Flowsheets  Navigator Location [facility]  Navigator Encounter Type Appt/Treatment Plan Review  Patient Visit Type MedOnc  Treatment Phase Pre-Tx/Tx Discussion  Barriers/Navigation Needs Coordination of Care  Interventions Coordination of Care  Acuity Level 2-Minimal Needs (1-2 Barriers Identified)  Coordination of Care Other  Support Groups/Services Friends and Family  Time Spent with Patient 15

## 2024-06-05 NOTE — Telephone Encounter (Signed)
 Medford informed of the message below

## 2024-06-05 NOTE — Consult Note (Cosign Needed Addendum)
 Palliative Care Consult Note                                  Date: 06/05/2024   Patient Name: Catherine [name]  DOB: 18-Dec-1934  MRN: [id number]  Age / Sex: 89 y.o., female  PCP: Micheal Wolm ORN, MD Referring Physician: Noralee Elidia Toribio DEWAINE  Reason for Consultation: Establishing goals of care  Past Medical History:  Diagnosis Date   AKI (acute kidney injury) (HCC) 06/22/2022   Amyloidosis (HCC) 05/24/2024   Aortic stenosis, mild 07/26/2017   Arthritis    Asthma    last attack 02/2015   Cellulitis of left lower extremity 08/17/2016   Ulcer associated with severe venous insufficiency   Chronic anticoagulation    Chronic diastolic CHF (congestive heart failure) (HCC)    Chronic kidney disease    RIGHT MANY KIDNEY INFECTIONS AND STONES   COPD (chronic obstructive pulmonary disease) (HCC)    Coronary artery disease    Dizziness    H/O: rheumatic fever    Hypertension    PAF (paroxysmal atrial fibrillation) (HCC)    PONV (postoperative nausea and vomiting)    ' SOMETIMES', BUT NOT ALWAYS   Primary hypertension    RHEUMATIC MITRAL STENOSIS 01/05/2008   Qualifier: Diagnosis of   By: Neysa MD, Clinton D        S/P Maze operation for atrial fibrillation 10/14/2016   Complete bilateral atrial lesion set using cryothermy and bipolar radiofrequency ablation - atrial appendage was not treated due to previous surgical procedure (open mitral commissurotomy)   S/P mitral valve replacement with bioprosthetic valve 10/14/2016   29 mm Medtronic Mosaic porcine bioprosthetic tissue valve   Sciatica 08/29/2023   Tricuspid regurgitation 07/14/2021   UTI (urinary tract infection)    Valvular heart disease    Has mitral stenosis with prior mitral commissurotomy in 1970    Subjective:   This NP Kathlyne Bolder reviewed medical records, received report from team, assessed the patient and then meet at the patient's bedside to discuss  diagnosis, prognosis, GOC, EOL wishes disposition and options.  Before meeting with the patient/family, I spent time reviewing the chart notes including nursing, hospitalist, and cardiologist notes from today. I also reviewed vital signs, nursing flowsheets, medication administrations record, labs, and imaging. Labs reviewed include: ProBNP on 06/04/24 elevated at 28,847 consistent with acute on chronic presentation of CHF.  CMP reviewed, creatinine today elevated however improved, down to 1.37 from 1.55 yesterday. Potassium level is decreased at 3.1, potassium replacement done, plan to recheck in am.   I met with patient at bedside. I also reached out to patient's daughter and HCPOA Julie Tucker and introduced palliative medicine and offer ongoing support during this hospital stay.   Patient appears frail, chronically-ill appearing, on O2 supplement at 2LPM/ however not in any form of acute distress. Able to answer all orientation questions accurately.    We meet to discuss diagnosis prognosis, GOC, EOL wishes, disposition and options. Concept of Palliative Care was introduced as specialized medical care for people and their families living with serious illness.  If focuses on providing relief from the symptoms and stress of a serious illness.  The goal is to improve quality of life for both the patient and the family. Values and goals of care important to patient and family were attempted to be elicited.  Created space and opportunity for patient  and family to explore thoughts  and feelings regarding current medical situation   Natural trajectory and current clinical status were discussed. Questions and concerns addressed. Patient  encouraged to call with questions or concerns.    Patient/Family Understanding of Illness:  The patient demonstrates a good understanding of her current acute and chronic medical conditions. She shared that she has been hospitalized multiple times over the past six  months, primarily due to chronic heart failure and other comorbidities. Her main symptom is shortness of breath. She is also aware of the life-limiting nature of her conditions and how they impact her quality of life. She noted a perceived decline in her health since receiving a pacemaker in December 2024. She also acknowledged her recent diagnosis of multiple myeloma and is awaiting oncology input regarding treatment options.  Her daughter, [name], also appears to have a clear and substantial understanding of the patient's medical status and ongoing health challenges.  Life Review: Patient is married to his husband for 69 years. She lives at home with her husband with daughter living close-by. She is a retired Producer, television/film/video, and loves yard work.    Patient Values: She highly values family, quality of life, functional independence  Baseline Status: Patient reports being ambulatory at baseline without the need for any assistive devices. She loves to cook for family but most recently, the shortness of breath and easy fatigability limits her activity. No reports with insomnia, and appetite has been good up until this recent hospital stay.   Today's Discussion:  In addition to prior discussions, we reviewed treatment options with the patient. I emphasized the importance of aligning medical decisions with her values and goals of care (GOC), and encouraged ongoing dialogue with family and the care team as preferences may evolve over time. Code status was confirmed, patient does not wish to undergo chest compressions or intubation in the event of cardiopulmonary arrest.  We discussed the expected progression of heart failure and her other comorbidities, acknowledging that despite best efforts, treatment options may become limited. The distinction between aggressive medical interventions and comfort-focused palliative care was explored.  Based on conversations with the patient and her daughter, they  currently wish to pursue appropriate medical interventions in hopes of improvement, while remaining open to comfort-focused care should her condition further decline. Regarding her recent multiple myeloma diagnosis, they are hopeful the oncology team will evaluate her during admission, as she had an outpatient appointment scheduled for today.  Her daughter inquired about comfort-focused care, and I introduced the hospice philosophy, emphasizing its focus on quality of life, comfort, and dignity, and its role in relieving symptoms and stress of serious illness. We discussed various hospice care settings. Daughter expressed appreciation for the information provided.   Goals: The patient is open to receiving medical interventions as appropriate with hope of getting better.  Review of Systems  Constitutional:  Reports shortness of breath, and easy fatigability. Denies pain in general.   Objective:   Primary Diagnoses: Present on Admission:  Acute on chronic diastolic CHF (congestive heart failure) (HCC)  Atrial fibrillation (HCC)  Coronary artery disease  COPD  GOLD 2  Multiple myeloma (HCC)  Chronic kidney disease, stage 3a (HCC)  Protein-calorie malnutrition, severe   Vital Signs:  BP (!) 129/58 (BP Location: Left Arm)   Pulse 100   Temp 97.6 F (36.4 C) (Oral)   Resp 20   Ht 5' 3 (1.6 m)   Wt 59.6 kg   SpO2 99%   BMI 23.28 kg/m   Physical  Exam Vitals and nursing note reviewed.  Constitutional:      Appearance: She is ill-appearing.  HENT:     Head: Normocephalic and atraumatic.  Cardiovascular:     Rate and Rhythm: Normal rate. Rhythm irregular.     Comments: Swelling to BLE Pulmonary:     Comments: Dyspnea more so on exertion, O2 at 2LPM/[place]/  Abdominal:     General: Abdomen is flat.  Musculoskeletal:     Cervical back: Normal range of motion.     Comments: Generalized weakness   Skin:    General: Skin is warm and dry.  Neurological:     General: No focal  deficit present.     Mental Status: She is alert and oriented to person, place, and time.     Palliative Assessment/Data: 50-60%   Advanced Care Planning:   Existing Vynca/ACP Documentation: Patient has a living will currently uploaded on our EHR.   Primary Decision Maker: PATIENT, her daughter, [name] is her HCPOA  Pertinent diagnosis: Acute on chronic diastolic CHF, Atrial Fibrillation, CKD stage 3a, CAD, severa protein calorie malnutrition, and newly diagnosed multiple myeloma.   The patient and/or family consented to a voluntary Advance Care Planning Conversation in person/over the phone. Individuals present for the conversation: Patient, and daughter [name] (telephonically).  Summary of the conversation: We reviewed the patient's clinical picture, including both acute and chronic conditions. The conversation included code status, treatment options moving forward, disease burden, expected trajectory, and the patient's expressed wishes.  Outcome of the conversations and/or documents completed: Patient and her daughter confirmed her DNR-L status. A copy of the patient's living will is available in the EHR. The patient expressed hope for improvement and a desire to continue treating the treatable. They remain open to transitioning to comfort-focused care if her condition worsens.  I spent 30 minutes providing separately identifiable ACP services with the patient and/or surrogate decision maker in a voluntary, in-person conversation discussing the patient's wishes and goals as detailed in the above note.  Assessment & Plan:   HPI/Patient Profile: 89 y.o. 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   SUMMARY OF RECOMMENDATIONS    Code Status: Maintain DNR/DNI status Per patient's wish, continue to treat the treatable in hopes for improvement. Family requesting Oncology consult while currently admitted to explore oncology treatment options (already communicated this with the attending).  Continue to provide psycho-social and emotional support to patient and family. Palliative medicine team will continue to follow.   Symptom Management: Per Primary team Palliative medicine is available to assist as needed.    Code Status: DNR - Limited (DNR/DNI)  Prognosis:  Prognosis is relatively poor due to advanced age, high disease burden placing her high risk for decompensation as evidenced by frequent hospital admissions.   Discharge Planning:  To Be Determined   Discussed with: Patient, patient's daughter and HCPOA [name], nursing, and medical team.    Billing based on MDM: High  Problems Addressed: One acute or chronic illness or injury that poses a threat to life or bodily function  Amount and/or Complexity of Data: Category 1:Review of prior external  note(s) from each unique source, Review of the result(s) of each unique test, and Assessment requiring an independent historian(s) and Category 3:Discussion of management or test interpretation with external physician/other qualified health care professional/appropriate source (not separately reported)  Risks: N/A   Thank you for allowing us  to participate in the care of OLEVIA WESTERVELT PMT will continue to support holistically.    Detailed review of medical records (labs, imaging, vital signs), medically appropriate exam, discussed with treatment team, counseling and education to patient, family, & staff, documenting clinical information, medication  management, coordination of care  Signed by: Kathlyne Bolder, NP Palliative Medicine Team  Team Phone # [phone] (Nights/Weekends)  06/05/2024, 2:58 PM

## 2024-06-05 NOTE — Assessment & Plan Note (Addendum)
 Echocardiogram with preserved LV systolic function with EF 70 to 75%, RV systolic function with mild reduction, RVSP 50.5 mmHg, LA with severe dilatation, RA with mild dilatation, mitral valve has been replaced, moderate TR, mild to moderate aortic stenosis.   Continue volume overloaded.   Agree with increased furosemide  80 mg IV bid  Continue SGLT 2 inh Blood pressure support with midodrine 

## 2024-06-05 NOTE — Hospital Course (Signed)
 Catherine [name] was admitted to the hospital with the working diagnosis of heart failure exacerbation.   89 yo female with the past medical history of atrial fibrillation, hypertension, rheumatic heart disease sp mitral valve replacement, coronary artery disease sp CABG, CKD, COPD, and multiple myeloma who presented with dyspnea and lower extremity edema. Recent hospitalization 08/18 to 05/24/24 for heart failure exacerbation, she was discharged on bumetanide  2 mg bid, SGLT 2 inh and midodrine .   This time she reported progressive dyspnea on exertion to the point of getting symptoms with minimal efforts, worsening lower extremity edema and rapid weight gain. On the day of hospitalization she was seen at the outpatient Cardiology office, found with volume overload and was referred to the ED for further management.  On her initial physical examination her blood pressure was 116/61, HR 59, RR 24 and 02 saturation 94% Lungs with bilateral rales, with no wheezing, heart S1 and S2 present, irregular, abdomen with no distention, positive lower extremity edema.

## 2024-06-05 NOTE — Progress Notes (Signed)
 Mobility Specialist Progress Note:    06/05/24 1627  Mobility  Activity Turned to back - supine (Ankle Pumps, Leg Lifts, Quad Sets x10)  Level of Assistance Standby assist, set-up cues, supervision of patient - no hands on  Assistive Device None  Range of Motion/Exercises Left leg;Right leg  Activity Response Tolerated well  Mobility Referral Yes  Mobility visit 1 Mobility  Mobility Specialist Start Time (ACUTE ONLY) 1627  Mobility Specialist Stop Time (ACUTE ONLY) 1634  Mobility Specialist Time Calculation (min) (ACUTE ONLY) 7 min   Pt pleasant but not feeling well. Previously in the earlier afternoon, developed sudden issues breathing and gasping for air. Since then, slightly doing better but was fatigued. Pt able to do bed level exercises. Left pt in bed w/ all needs met.   Venetia Keel Mobility Specialist Please Neurosurgeon or Rehab Office at [phone]

## 2024-06-05 NOTE — Progress Notes (Signed)
 Progress Note   Patient: Catherine [name] FMW:[id number] DOB: Jul 08, 1935 DOA: 06/04/2024     1 DOS: the patient was seen and examined on 06/05/2024   Brief hospital course: Catherine [name] was admitted to the hospital with the working diagnosis of heart failure exacerbation.   89 yo female with the past medical history of atrial fibrillation, hypertension, rheumatic heart disease sp mitral valve replacement, coronary artery disease sp CABG, CKD, COPD, and multiple myeloma who presented with dyspnea and lower extremity edema. Recent hospitalization 08/18 to 05/24/24 for heart failure exacerbation, she was discharged on bumetanide  2 mg bid, SGLT 2 inh and midodrine .   This time she reported progressive dyspnea on exertion to the point of getting symptoms with minimal efforts, worsening lower extremity edema and rapid weight gain. On the day of hospitalization she was seen at the outpatient Cardiology office, found with volume overload and was referred to the ED for further management.  On her initial physical examination her blood pressure was 116/61, HR 59, RR 24 and 02 saturation 94% Lungs with bilateral rales, with no wheezing, heart S1 and S2 present, irregular, abdomen with no distention, positive lower extremity edema.    Assessment and Plan: * Acute on chronic diastolic CHF (congestive heart failure) (HCC) Echocardiogram with preserved LV systolic function with EF 70 to 75%, RV systolic function with mild reduction, RVSP 50.5 mmHg, LA with severe dilatation, RA with mild dilatation, mitral valve has been replaced, moderate TR, mild to moderate aortic stenosis.   Continue volume overloaded.   Agree with increased furosemide  80 mg IV bid  Continue SGLT 2 inh Blood pressure support with midodrine    Atrial fibrillation (HCC) Continue Eliquis  as primary prophylaxis for an acute stroke  Chronic kidney disease, stage 3a (HCC) Hypokalemia AKI  Renal function with serum cr at 1,37 with K at 3,1  and serum bicarbonate at 23  Na 140   Plan to continue diuresis and K correction Follow up renal function and electrolytes in am.     COPD  GOLD 2 Continue as needed bronchodilator therapy and inhaled steroids  Coronary artery disease Stable Continue Eliquis   Protein-calorie malnutrition, severe Severe protein calorie malnutrition in the setting of chronic illness Continue Ensure Plus high-protein twice daily Continue MVI and zinc  sulfate  Multiple myeloma (HCC) Patient with a history of multiple myeloma with associated hypoproteinemia resulting in significant lower extremity swelling  Palliative care was consulted.       Subjective: patient continue to have severe dyspnea, with edema, no chest pain.   Physical Exam: Vitals:   06/05/24 0600 06/05/24 0740 06/05/24 1020 06/05/24 1520  BP: (!) 126/53 (!) 128/57 (!) 129/58 128/61  Pulse: 60 60 100 60  Resp:  20 20 20   Temp: (!) 97.4 F (36.3 C) (!) 97.5 F (36.4 C) 97.6 F (36.4 C) 98.3 F (36.8 C)  TempSrc: Oral Oral Oral Oral  SpO2: 97% 100% 99% 98%  Weight: 59.6 kg     Height:       Neurology awake and alert ENT with mild pallor Cardiovascular with S1 and S2 present, irregularly irregular, positive systolic murmur at the base and right lower sternal border Positive JVD Respiratory with bilateral rales with no wheezing Abdomen with no distention  Positive lower extremity edema  Data Reviewed:    Family Communication: no family at the bedside   Disposition: Status is: Inpatient Remains inpatient appropriate because: IV diuresis   Planned Discharge Destination: Home     Author: Elidia Sieving  Shaden Lacher, MD 06/05/2024 6:18 PM  For on call review [email or web address].

## 2024-06-06 ENCOUNTER — Telehealth: Payer: Self-pay

## 2024-06-06 DIAGNOSIS — I5033 Acute on chronic diastolic (congestive) heart failure: Secondary | ICD-10-CM | POA: Diagnosis not present

## 2024-06-06 DIAGNOSIS — R600 Localized edema: Secondary | ICD-10-CM | POA: Diagnosis not present

## 2024-06-06 DIAGNOSIS — Z515 Encounter for palliative care: Secondary | ICD-10-CM | POA: Diagnosis not present

## 2024-06-06 DIAGNOSIS — E854 Organ-limited amyloidosis: Secondary | ICD-10-CM | POA: Diagnosis not present

## 2024-06-06 DIAGNOSIS — Z7189 Other specified counseling: Secondary | ICD-10-CM | POA: Diagnosis not present

## 2024-06-06 LAB — BASIC METABOLIC PANEL WITH GFR
Anion gap: 9 (ref 5–15)
BUN: 27 mg/dL — ABNORMAL HIGH (ref 8–23)
CO2: 26 mmol/L (ref 22–32)
Calcium: 8.5 mg/dL — ABNORMAL LOW (ref 8.9–10.3)
Chloride: 106 mmol/L (ref 98–111)
Creatinine, Ser: 1.4 mg/dL — ABNORMAL HIGH (ref 0.44–1.00)
GFR, Estimated: 37 mL/min — ABNORMAL LOW (ref >60)
Glucose, Bld: 101 mg/dL — ABNORMAL HIGH (ref 70–99)
Potassium: 3.3 mmol/L — ABNORMAL LOW (ref 3.5–5.1)
Sodium: 141 mmol/L (ref 135–145)

## 2024-06-06 LAB — MAGNESIUM: Magnesium: 2.1 mg/dL (ref 1.7–2.4)

## 2024-06-06 MED ORDER — POTASSIUM CHLORIDE CRYS ER 20 MEQ PO TBCR
40.0000 meq | EXTENDED_RELEASE_TABLET | Freq: Two times a day (BID) | ORAL | Status: AC
  Administered 2024-06-06 (×2): 40 meq via ORAL
  Filled 2024-06-06 (×2): qty 2

## 2024-06-06 NOTE — Plan of Care (Signed)

## 2024-06-06 NOTE — Consult Note (Signed)
Catherine [name] is well-known to me.  She is a very nice 89 year old white female.  She has likely amyloidosis.  She keeps coming in with congestive heart failure.  I think this time, she may have come in with another bout of congestive heart failure.  She saw her family doctor.  She had wrappings on her lower legs.  She was noted to have some weight gain.  It was felt that she needed to have some aggressive volume diuresis.  Of note, she had a bone marrow biopsy that was done back on 05/21/2024.  She kappa restricted plasma cells that were about 50% of the bone marrow.  She had a 24-hour urine that was done.  She had 2000 however 73 mg/L of Kappa light chain.  She had nephrotic range protein of 12 g.  Her serum kappa light chain is 1234 mg/L.  She had a monoclonal spike of 0.1 g/dL.  Her IgG level is little bit low at 400 mg/dL.  We are going to try to get her started on treatment.  Again, she has amyloid by her bone marrow biopsy and the stain.  I do not know if she has cardiac amyloid.  On her echocardiogram there is no ventricular wall thickness.  She does have severe dilation of her atria.  She does have valvular regurgitation.  Her labs which came in showed a sodium 138.  Potassium 3.7.  BUN 31 creatinine 1.55.  Calcium  9.5 with an albumin  of 2.7.  Total protein was 5.6.  Her wounds white cell count is 5.9.  Hemoglobin 10.6.  Platelet count 133,000.  Again she is quite weak.  She has shortness of breath.  Again cardiology is trying to diurese her.  I plan to give her Faspro with Velcade.  These are both subcu administration.  Her performance status is not that great.  I would say that her performance status, at best, is probably ECOG 2.  I am unsure how much she is really able to eat.  She is on Eliquis  at a low dose.  Again, she wishes to have treatment.  I think this is reasonable.  Again her treatments are subcutaneous.  I totally agree with the DNR status.  I told her that treatment  certainly might decrease her myeloma levels but I am unsure how much treatment would help with her cardiac status.  I think that is going to be the real unknown.    Her vital signs show temperature 97.8.  Pulse 60.  Blood pressure 112/48.  Her lungs sound decent.  She does have some crackles and rales.  She has a few wheezes.  Cardiac exam regular rate and rhythm.  She has a 2/6 systolic ejection murmur.  Abdomen soft.  Bowel sounds are present.  There is no fluid wave.  There is no palpable liver or spleen tip.  Extremities shows about 2+ edema in the lower legs.  Neurological exam is nonfocal.   Again, Catherine [name] was admitted with another bout of congestive heart failure.  She has myeloma.  Again I do not know if she has cardiac amyloid although the echocardiogram certainly does not suggest this.  If we get out of the hospital, then we can certainly institute therapy to try to help with the myeloma and may be help with recurrent admissions.  We will follow along.  I know that Cardiology is doing a fantastic job and try to improve her quality of life.   Jeralyn Crease, MD  Proverbs  3:5-6 

## 2024-06-06 NOTE — Plan of Care (Signed)
   Problem: Education: Goal: Knowledge of General Education information will improve Description Including pain rating scale, medication(s)/side effects and non-pharmacologic comfort measures Outcome: Progressing

## 2024-06-06 NOTE — Progress Notes (Signed)
 Progress Note  Patient Name: Catherine [name] Date of Encounter: 06/06/2024 [facility] Cardiologist: Annabella Scarce, MD   Interval Summary   Continues to have shortness of breath and orthopnea. Denies any chest pain, melena, hematuria, and hematochezia. Hematoma on left leg was dripping a clear liquid. Patient reported that this was from her compression stockings.  Patient was complaining about how hematoma made bed wet.  Vital Signs Vitals:   06/05/24 2305 06/06/24 0351 06/06/24 0352 06/06/24 0730  BP: (!) 145/55  (!) 112/48 (!) 115/46  Pulse: 68 60 60 (!) 59  Resp: 19  18 18   Temp: 98.4 F (36.9 C)  97.8 F (36.6 C) 98.7 F (37.1 C)  TempSrc: Oral  Oral Oral  SpO2: 98% 96% 95% 99%  Weight:   60.5 kg   Height:        Intake/Output Summary (Last 24 hours) at 06/06/2024 0930 Last data filed at 06/06/2024 0350 Gross per 24 hour  Intake 120 ml  Output 1400 ml  Net -1280 ml      06/06/2024    3:52 AM 06/05/2024    6:00 AM 06/04/2024    8:40 PM  Last 3 Weights  Weight (lbs) 133 lb 6.1 oz 131 lb 6.3 oz 137 lb 2 oz  Weight (kg) 60.5 kg 59.6 kg 62.2 kg      Telemetry/ECG  Has a V paced rhythm with heart rates in the 60s.  Has had multiple runs of junctional tachycardia with rates about 120- Personally Reviewed  Physical Exam  GEN: No acute distress.   Neck: No JVD Cardiac: RRR, 2 out of 6 systolic murmur on upper sternal border and 2 out of 6 systolic murmur near apex.  Respiratory: Bibasilar crackles GI: Soft, nontender, non-distended  MS: 3+ bilateral lower extremity edema.  Hematoma behind left leg dripping a clear liquid.  Assessment & Plan   Acute on chronic HFpEF Hypokalemia Multiple myeloma with AL amyloidosis presented with worsening shortness of breath and lower extremity edema. Chest x-ray on admission found small left and moderate right pleural effusion.  On admission had elevated proBNP. Has hypoalbuminemia.  This is likely contributing to  increased third spacing. Initially received 40 mg of IV Lasix  twice daily.  Yesterday this was increased to 80 Lasix  twice daily.  Has been seen by oncology and plans to start outpatient treatment for multiple myeloma. Potassium 3.3.  Ordered potassium replacement I's and O's are net out 1L over the past day.  Weight is down almost 2 Kgs from admission.  Continue Lasix  80 mg twice daily. GDMT Continue Jardiance  10 mg daily Unable to start MRA due to CKD.   Paroxysmal atrial fibrillation Junctional tachycardia Sinus node dysfunction s/p PPM CHA2DS2-VASc Score = 6 [CHF History: 1, HTN History: 1, Diabetes History: 0, Stroke History: 0, Vascular Disease History: 1, Age Score: 2, Gender Score: 1].  Therefore, the patient's annual risk of stroke is 9.7 %.    Has not tolerated beta-blocker in the past.  Also has COPD.  Will avoid rate controlling agents given hypotension. Previously planned to get an ablation but was postponed due to multiple hospitalizations. Continue Eliquis  2.5 mg twice daily.  Suspect weight is up slightly from patient's baseline due to heart failure.   Hypotension Blood pressures continue to be soft recent BP 115/46 Continue midodrine  10 mg 3 times daily   CAD s/p CABG Hyperlipidemia Previously did not tolerate statin or Zetia . LDL on 03/2024 was 127.  Goal is less than  70 May consider referral to lipid clinic for PCSK9 but patient has previously declined PSK 9's. No need for aspirin  given Eliquis  for atrial fibrillation.   Aortic stenosis Mitral valve disease s/p MVR Moderate TR Pulmonary hypertension Echo on 04/24/2024 showed hyperdynamic LVEF of 70-75%, mildly reduced RV systolic function, moderately elevated pulmonary systolic pressure, dilated left and right atrium, mild to moderate aortic stenosis with a mean gradient of 19 mmHg, repaired mitral valve, no evidence of mitral regurgitation, mean mitral gradient of 8 mmHg, and moderate TR.   Lower extremity  hematoma On exam was dripping a clear fluid at a constant rate.  May consider a wound drain. Management per primary   Otherwise manage per primary     For questions or updates, please contact [facility] Please consult [email or web address] for contact info under       Signed, Kyros Salzwedel, PA-C

## 2024-06-06 NOTE — Progress Notes (Signed)
 Palliative Medicine Inpatient Follow Up Note   HPI: 89 y.o. 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   Today's Discussion 06/06/2024  *Please note that this is a verbal dictation therefore any spelling or grammatical errors are due to the Dragon Medical One system interpretation.  Chart reviewed inclusive of vital signs, progress notes, laboratory results, and diagnostic images. Labs reviewed include CMP, creatinine 1.40, BUN 47 and EGFR of 37. Potassium is still lo but improved at 3.3 from 3.1 yesterday, replaced. ProBNP from 9/82025 was 28,847.0   Visited patient today. Her sister, Rock, and husband, Norleen, were present at the bedside. Patient was seated in a recliner, appearing frail but not in acute distress. She was alert, oriented, and able to express her needs. She reported improved breathing compared to yesterday and continues on 2LPM oxygen  via nasal cannula. She noted persistent tightness in both lower extremities due to swelling, though she feels it is improving. Left knee remains weeping; roll gauze was observed in place.  Patient shared that she was seen by Oncology today and discussed outpatient treatment options for her multiple myeloma. She expressed interest in pursuing this treatment. We reviewed her acute and chronic conditions, including heart failure as the likely cause of her dyspnea and lower extremity edema. She acknowledged the chronic nature of her illness and the potential need for recurrent medical interventions,  including hospitalization. We revisited best and worst case scenarios, and she reaffirmed her desire to pursue treatment for manageable conditions in hopes of clinical improvement. She is however very clear that if she continues to decline, she would want to transition to comfort-focused care.   We also discussed additional support post-discharge. I introduced outpatient palliative care services as a supportive resource given her disease burden and symptomatology.  She was receptive and expressed interest.  Following the visit, I contacted her daughter and [place], [name], to provide an update. Mliss is supportive of adding outpatient palliative care and is leaning toward [facility], as she is familiar with someone from the organization. She also requested that Oncologist reach out to her directly regarding treatment options for her mother's multiple myeloma.  Created space and opportunity for patient to explore thoughts feelings and fears regarding current medical situation.  Patient and her family face treatment option decisions, advanced directive decisions and anticipatory care needs.   Questions and concerns addressed   Palliative Support Provided.   Objective Assessment: Vital Signs Vitals:   06/06/24 0730 06/06/24 1129  BP: (!) 115/46 (!) 124/48  Pulse: (!) 59 95  Resp: 18 16  Temp: 98.7 F (37.1 C) 98.3 F (36.8 C)  SpO2: 99% 98%    Intake/Output Summary (Last 24 hours) at 06/06/2024 1336 Last data filed at 06/06/2024 1000 Gross per 24 hour  Intake 120 ml  Output 1300 ml  Net -1180 ml   Last Weight  Most recent update: 06/06/2024  3:58 AM    Weight  60.5 kg (133 lb 6.1 oz)             Physical Exam Vitals and nursing note reviewed.  Constitutional:      Appearance: She  is ill-appearing.  HENT:     Head: Normocephalic and atraumatic.  Cardiovascular:     Rate and Rhythm: Normal rate. Rhythm irregular.     Comments: Swelling to BLE Pulmonary:     Comments:  Dyspnea more so on exertion, O2 at 2LPM/[place]/  Abdominal:     General: Abdomen is flat.  Musculoskeletal:     Cervical back: Normal range of motion.     Comments: Generalized weakness   Skin:    General: Skin is warm and dry.  Neurological:     General: No focal deficit present.     Mental Status: She is alert and oriented to person, place, and time.   SUMMARY OF RECOMMENDATIONS    Code Status: Maintain DNR/DNI status Per patient's wish, continue to treat the treatable in hopes for improvement. Seen by Oncology today, daughter HCPOA requested for Oncologist to reach out to her to discuss treatment option (this has been communicated to Dr. Timmy). Consulted wound/ostomy nurse for her weeping BLE.  Consulted chaplain for spiritual support.  Continue to provide psycho-social and emotional support to patient and family. TOC referral made for outpatient palliative. Patient/family expressed interest with [facility].  Palliative medicine team will continue to follow.    Symptom Management: Per Primary team Palliative medicine is available to assist as needed.    Discussed treatment plan with patient, patient's daughter/HCPOA Mliss, nursing and treatment team.    Time Spent: 50 minutes   ______________________________________________________________________________________ Kathlyne Bolder NP-C [place] [facility] Team Team Cell Phone: [phone] Please utilize secure chat with additional questions, if there is no response within 30 minutes please call the above phone number  Palliative Medicine Team providers are available by phone from 7am to 7pm daily and can be reached through the team cell phone.  Should this patient require assistance outside of these hours, please call the patient's attending physician.

## 2024-06-06 NOTE — Consult Note (Addendum)
 WOC Nurse Consult Note: Reconsult for worsening edema Discussed case with primary nurse via Secure Chat, provider would like the weeping without any dressing; open to air, patient was seen yesterday by WOC Reason for Consult: back of legs; weeping Wound type: fluid and edema BLE Patient currently has chronic CHF diastolic/systolic, myeloma, malnutrition Drainage (amount, consistency, odor) see flowsheet  Suggested that nursing place disposable pads under patient's legs to absorb excess fluid that is weeping; patient has extensive amount of BLE edema related to cardiovascular diease process. Nurse to take updated image of posterior legs. Recommended that patient have her heels off-loaded or placed in protective boots to prevent PI to heels. Informed by nurse that Palliative Care consult initiated.   Please reconsult if wound worsens in condition and notify provider.   Sherrilyn Hals MSN RN CWOCN W[facility]  [phone] (Available from 7-3 pm Mon-Friday)

## 2024-06-07 ENCOUNTER — Encounter (HOSPITAL_BASED_OUTPATIENT_CLINIC_OR_DEPARTMENT_OTHER): Payer: Self-pay | Admitting: Cardiovascular Disease

## 2024-06-07 DIAGNOSIS — Z789 Other specified health status: Secondary | ICD-10-CM

## 2024-06-07 DIAGNOSIS — E854 Organ-limited amyloidosis: Secondary | ICD-10-CM

## 2024-06-07 DIAGNOSIS — I509 Heart failure, unspecified: Secondary | ICD-10-CM | POA: Diagnosis not present

## 2024-06-07 DIAGNOSIS — I5033 Acute on chronic diastolic (congestive) heart failure: Secondary | ICD-10-CM | POA: Diagnosis not present

## 2024-06-07 DIAGNOSIS — N179 Acute kidney failure, unspecified: Secondary | ICD-10-CM | POA: Diagnosis not present

## 2024-06-07 DIAGNOSIS — N08 Glomerular disorders in diseases classified elsewhere: Secondary | ICD-10-CM

## 2024-06-07 DIAGNOSIS — R600 Localized edema: Secondary | ICD-10-CM | POA: Diagnosis not present

## 2024-06-07 DIAGNOSIS — Z66 Do not resuscitate: Secondary | ICD-10-CM

## 2024-06-07 DIAGNOSIS — R531 Weakness: Secondary | ICD-10-CM

## 2024-06-07 LAB — COMPREHENSIVE METABOLIC PANEL WITH GFR
ALT: 11 U/L (ref 0–44)
AST: 24 U/L (ref 15–41)
Albumin: 1.7 g/dL — ABNORMAL LOW (ref 3.5–5.0)
Alkaline Phosphatase: 77 U/L (ref 38–126)
Anion gap: 11 (ref 5–15)
BUN: 27 mg/dL — ABNORMAL HIGH (ref 8–23)
CO2: 24 mmol/L (ref 22–32)
Calcium: 8.4 mg/dL — ABNORMAL LOW (ref 8.9–10.3)
Chloride: 105 mmol/L (ref 98–111)
Creatinine, Ser: 1.35 mg/dL — ABNORMAL HIGH (ref 0.44–1.00)
GFR, Estimated: 38 mL/min — ABNORMAL LOW (ref >60)
Glucose, Bld: 94 mg/dL (ref 70–99)
Potassium: 3.5 mmol/L (ref 3.5–5.1)
Sodium: 140 mmol/L (ref 135–145)
Total Bilirubin: 0.6 mg/dL (ref 0.0–1.2)
Total Protein: 4.6 g/dL — ABNORMAL LOW (ref 6.5–8.1)

## 2024-06-07 LAB — CBC WITH DIFFERENTIAL/PLATELET
Abs Immature Granulocytes: 0.01 K/uL (ref 0.00–0.07)
Basophils Absolute: 0.1 K/uL (ref 0.0–0.1)
Basophils Relative: 1 %
Eosinophils Absolute: 0.3 K/uL (ref 0.0–0.5)
Eosinophils Relative: 5 %
HCT: 30.5 % — ABNORMAL LOW (ref 36.0–46.0)
Hemoglobin: 9.8 g/dL — ABNORMAL LOW (ref 12.0–15.0)
Immature Granulocytes: 0 %
Lymphocytes Relative: 25 %
Lymphs Abs: 1.5 K/uL (ref 0.7–4.0)
MCH: 30.2 pg (ref 26.0–34.0)
MCHC: 32.1 g/dL (ref 30.0–36.0)
MCV: 93.8 fL (ref 80.0–100.0)
Monocytes Absolute: 0.6 K/uL (ref 0.1–1.0)
Monocytes Relative: 10 %
Neutro Abs: 3.5 K/uL (ref 1.7–7.7)
Neutrophils Relative %: 59 %
Platelets: 107 K/uL — ABNORMAL LOW (ref 150–400)
RBC: 3.25 MIL/uL — ABNORMAL LOW (ref 3.87–5.11)
RDW: 13.9 % (ref 11.5–15.5)
WBC: 5.9 K/uL (ref 4.0–10.5)
nRBC: 0 % (ref 0.0–0.2)

## 2024-06-07 LAB — KAPPA/LAMBDA LIGHT CHAINS
Kappa free light chain: 1184.8 mg/L — ABNORMAL HIGH (ref 3.3–19.4)
Kappa, lambda light chain ratio: 38.34 — ABNORMAL HIGH (ref 0.26–1.65)
Lambda free light chains: 30.9 mg/L — ABNORMAL HIGH (ref 5.7–26.3)

## 2024-06-07 MED ORDER — POTASSIUM CHLORIDE CRYS ER 20 MEQ PO TBCR
40.0000 meq | EXTENDED_RELEASE_TABLET | Freq: Two times a day (BID) | ORAL | Status: AC
  Administered 2024-06-07 (×2): 40 meq via ORAL
  Filled 2024-06-07 (×2): qty 2

## 2024-06-07 NOTE — Progress Notes (Signed)
 Orthopedic Tech Progress Note Patient Details:  Catherine [name] 12-21-1934 [id number]  Ortho Devices Type of Ortho Device: Ace wrap, Unna boot Ortho Device/Splint Location: BLE Ortho Device/Splint Interventions: Ordered, Application, Adjustment   Post Interventions Patient Tolerated: Well Instructions Provided: Care of device  Catherine [name] Pac 06/07/2024, 3:36 PM

## 2024-06-07 NOTE — Plan of Care (Signed)
  Problem: Clinical Measurements: Goal: Will remain free from infection Outcome: Progressing   Problem: Activity: Goal: Risk for activity intolerance will decrease Outcome: Progressing   Problem: Coping: Goal: Level of anxiety will decrease Outcome: Progressing   Problem: Safety: Goal: Ability to remain free from injury will improve Outcome: Progressing   

## 2024-06-07 NOTE — Progress Notes (Signed)
 Progress Note  Patient Name: Catherine [name] Date of Encounter: 06/07/2024 [facility] Cardiologist: Annabella Scarce, MD   Interval Summary   On interview patient reported shortness of breath was improving.  Denied any chest pain, fever, diaphoresis, nausea, vomiting, melena, hematuria, and hematochezia.  Vital Signs Vitals:   06/07/24 0330 06/07/24 0331 06/07/24 0813 06/07/24 0949  BP:  (!) 115/57 (!) 142/63 (!) 113/59  Pulse: (!) 56 (!) 59 81 60  Resp:  17 16 16   Temp:  (!) 97.4 F (36.3 C) 97.6 F (36.4 C) 98.1 F (36.7 C)  TempSrc:  Oral Oral Oral  SpO2: 98% 98% 99% 98%  Weight:  59.2 kg    Height:        Intake/Output Summary (Last 24 hours) at 06/07/2024 1054 Last data filed at 06/07/2024 9078 Gross per 24 hour  Intake 360 ml  Output 1050 ml  Net -690 ml      06/07/2024    3:31 AM 06/06/2024    3:52 AM 06/05/2024    6:00 AM  Last 3 Weights  Weight (lbs) 130 lb 8.2 oz 133 lb 6.1 oz 131 lb 6.3 oz  Weight (kg) 59.2 kg 60.5 kg 59.6 kg      Telemetry/ECG  Underlying rhythm is AV paced with a rates in the 60s.  Patient has multiple runs of junctional tachycardia in the 120s.  Most of these runs seem to happen between 8 and 10:00 am.- Personally Reviewed  Physical Exam  GEN: No acute distress.  Cachectic and frail appearing. Neck: No JVD Cardiac: RRR, no murmurs, rubs, or gallops.  Respiratory: Bibasilar crackles GI: Soft, nontender, non-distended  MS: 2+ bilateral lower extremity edema  Assessment & Plan   Acute on chronic HFpEF Hypokalemia Multiple myeloma with AL amyloidosis presented with worsening shortness of breath and lower extremity edema. Chest x-ray on admission found small left and moderate right pleural effusion.  On admission had elevated proBNP. Has hypoalbuminemia (albumin  1.7).  This is likely contributing to increased third spacing. Initially received 40 mg of IV Lasix  twice daily.  This was increased to 80 Lasix  twice daily.  Has  been seen by oncology and plans to start outpatient treatment for multiple myeloma. Potassium 3.5. Likely needs continuous potassium replacement. I's and O's are net out 2L over the past day.  Weight is down almost 3 Kgs from admission.  Continue IV Lasix  80 mg twice daily. GDMT Continue Jardiance  10 mg daily Unable to start MRA due to CKD and Hypotension.     Paroxysmal atrial fibrillation Junctional tachycardia Sinus node dysfunction s/p PPM CHA2DS2-VASc Score = 6 [CHF History: 1, HTN History: 1, Diabetes History: 0, Stroke History: 0, Vascular Disease History: 1, Age Score: 2, Gender Score: 1].  Therefore, the patient's annual risk of stroke is 9.7 %.    Has not tolerated beta-blocker in the past.  Also has COPD.  Will avoid rate controlling agents given hypotension. Previously planned to get an ablation but was postponed due to multiple hospitalizations. Upon reviewing telemetry most of the patient's runs of junctional tachycardia appear to be happening between 8 and 10 AM.  Patient reported that she drank a cup of coffee this morning at about 8 AM.  Suspect this may be related to the junctional tachycardia.  Recommend the patient drink decaffeinated coffee.  She reported that at home she drinks decaffeinated coffee. Continue Eliquis  2.5 mg twice daily.   Minimize caffeine intake as this appears to be contributing to junctional  tachycardia.     Hypotension Blood pressures continue to be soft recent BP 115/46 Continue midodrine  10 mg 3 times daily     CAD s/p CABG Hyperlipidemia Previously did not tolerate statin or Zetia .  Patient has previously declined PSK 9's. LDL on 03/2024 was 127.  Goal is less than 70 No need for aspirin  given Eliquis  for atrial fibrillation.     Aortic stenosis Mitral valve disease s/p MVR Moderate TR Pulmonary hypertension Echo on 04/24/2024 showed hyperdynamic LVEF of 70-75%, mildly reduced RV systolic function, moderately elevated pulmonary systolic  pressure, dilated left and right atrium, mild to moderate aortic stenosis with a mean gradient of 19 mmHg, repaired mitral valve, no evidence of mitral regurgitation, mean mitral gradient of 8 mmHg, and moderate TR. Unlikely that this is the cause of the patient's heart failure.   Lower extremity hematoma Seen by wound care    Otherwise manage per primary  Heart care will continue to follow Other recommendations (labs, testing, etc):  BMET in 1 week after discharge. Follow up as an outpatient:  outpatient follow up with Swinyer, Rosaline HERO, NP on 06/18/24. For questions or updates, please contact [facility] Please consult [email or web address] for contact info under       Signed, Reshonda Koerber, PA-C

## 2024-06-07 NOTE — Progress Notes (Signed)
 This chaplain responded to PMT NP-[place] consult for Pt. spiritual support and prayers. The Pt. is awake, sitting in the bedside recliner. The Pt. accepts an opportunity for story telling after a several visits from the medical team.  The chaplain understands the Pt. trusts the  relationship with her daughter-Julie, who is also her HCPOA. The Pt. storytelling often included the love for multiple generations of family and God. The Pt. goal is to return home, continue to live life with her husband, and meet the expected great grandchild in November.  The chaplain exited the room with a blessing and Pt. permission to rest between medical visits. This chaplain is available for F/U spiritual care as needed.  Chaplain Leeroy Hummer [phone]

## 2024-06-07 NOTE — Progress Notes (Signed)
 Orthopedic Tech Progress Note Patient Details:  Catherine [name] March 03, 1935 [id number]  Patient ID: Catherine [name] [place], female   DOB: Dec 15, 1934, 88 y.o.   MRN: [id number] RN called about reapplying the U.B due to drainage. However, I spoke to my coworker and we believe the unna boots should wait to be changed until wound care can reassess the situation because if they're already saturated 5 hours from the first set then the it'll do the same with the second set. Once the proper steps can be made then the AM ortho techs can replace the unna boots 06/08/24.    Catherine [name] 06/07/2024, 11:45 PM

## 2024-06-07 NOTE — Progress Notes (Signed)
 Palliative Medicine Inpatient Follow Up Note   HPI: 89 y.o. 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   Today's Discussion 06/07/2024  *Please note that this is a verbal dictation therefore any spelling or grammatical errors are due to the Dragon Medical One system interpretation.  Chart reviewed inclusive of vital signs, progress notes, laboratory results, and diagnostic images.  Visited the patient at bedside today. She appeared chronically ill but was alert, seated comfortably in her recliner, and on 2LPM oxygen  via nasal cannula without signs of acute distress. She reported feeling better overall, with improved appetite and breathing. She was visibly pleased to share that her attending physician mentioned a possible discharge in two days.   Spoke with the RN, who confirmed no current concerns or needs. The patient remains optimistic about returning home soon.   Later, I spoke with the patient's daughter by phone to provide an update, including the referral to [facility] for outpatient palliative care, organization she had previously expressed interest in. We revisited the benefits of palliative services, emphasizing their role in symptom management and providing additional support in the home setting.  The treatment plan remains unchanged including the plan to pursue with oncology treatment as it pertains to her multiple myeloma. Patient and family were encouraged to continue open discussions regarding goals of care and future  planning.  Created space and opportunity for patient to explore thoughts feelings and fears regarding current medical situation.  Patient and her family face treatment option decisions, advanced directive decisions and anticipatory care needs.   Questions and concerns addressed   Palliative Support Provided.   Objective Assessment: Vital Signs Vitals:   06/07/24 0331 06/07/24 0813  BP: (!) 115/57 (!) 142/63  Pulse: (!) 59 81  Resp: 17 16  Temp: (!) 97.4 F (36.3 C) 97.6 F (36.4 C)  SpO2: 98% 99%    Intake/Output Summary (Last 24 hours) at 06/07/2024 0920 Last data filed at 06/07/2024 9386 Gross per 24 hour  Intake 240 ml  Output 900 ml  Net -660 ml   Last Weight  Most recent update: 06/07/2024  3:37 AM    Weight  59.2 kg (130 lb 8.2 oz)             Physical Exam Vitals and nursing note reviewed.  Constitutional:      Appearance: She is ill-appearing.  HENT:     Head: Normocephalic and atraumatic.  Cardiovascular:     Rate and Rhythm: Normal rate. Rhythm irregular.     Comments: Swelling to BLE Pulmonary:     Comments: Dyspnea more so on exertion, O2 at 2LPM/[place]/  Abdominal:     General: Abdomen is flat.  Musculoskeletal:     Cervical back: Normal range of motion.     Comments: Generalized weakness   Skin:    General: Skin is warm and dry.  Neurological:     General: No focal deficit present.     Mental Status: She is alert and oriented to person, place, and time.   SUMMARY OF RECOMMENDATIONS    Code Status: Maintain DNR/DNI status (DNR form completed and placed on patient's  hard file.) Per patient's wish, continue to treat the treatable in hopes for improvement..  Continue to provide psycho-social and emotional support to patient and family. Outpatient palliative at discharge. Patient/family expressed interest with [facility]. TOC referral placed on 06/06/2024.  Palliative medicine team will continue to follow.    Symptom Management: Per Primary  team Palliative medicine is available to assist as needed.    Discussed treatment plan with patient, patient's daughter/HCPOA Mliss, nursing and treatment team.    Time Spent: 35 minutes   ______________________________________________________________________________________ Kathlyne Bolder NP-C [place] [facility] Team Team Cell Phone: [phone] Please utilize secure chat with additional questions, if there is no response within 30 minutes please call the above phone number  Palliative Medicine Team providers are available by phone from 7am to 7pm daily and can be reached through the team cell phone.  Should this patient require assistance outside of these hours, please call the patient's attending physician.

## 2024-06-07 NOTE — Progress Notes (Addendum)
 PROGRESS NOTE    Catherine [name]  FMW:[id number] DOB: 09/18/1935 DOA: 06/04/2024 PCP: Micheal Wolm ORN, MD   88/F w chronic atrial fibrillation, hypertension, sp bioprosthetic MVR, CAD CABG, stage IIIa chronic kidney disease, COPD and recently diagnosed multiple myeloma who was sent to the hospital by her cardiologist for worsening bilateral lower extremity swelling, dyspnea. She also complains of a hematoma in her left leg. fourth hospitalization in the last 2 months Abnormal labs include BUN of 31, creatinine of 1.55 compared to baseline of 1.17, proBNP 28,000 847, platelet count 70, hemoglobin 16.6, hematocrit 49.7 Chest x-ray reviewed by me shows bilateral pleural effusions with interval slight decrease in moderate right-sided pleural effusion. Stable small left pleural effusion,   Subjective: -Feels fair, no events overnight, breathing somewhat better  Assessment and Plan:   Acute on chronic diastolic CHF (congestive heart failure) (HCC) Last 2D echocardiogram from 07/25 showed an LVEF of 70 to 75% with mild to moderate aortic valve stenosis - Has significant hypoalbuminemia contributing to her edema however - 2.6 L negative, continue lasix  80mg  BID, jardiance , midodrine  - Add Unna boots -Poor prognosis, Palliative following, high risk of quick readmission  Multiple myeloma (HCC)/Amyloidosis -poor prognosis. Dr.Ennever following, considering Rx  P Atrial fibrillation (HCC) -continue eliquis  - Has had paroxysmal SVT as well, history of intolerance to beta-blockers, was planned to have SVT ablation, postponed with frequent admissions  Chronic kidney disease, stage 3a (HCC) stable  COPD  GOLD 2 Continue as needed bronchodilator therapy and inhaled steroids  Coronary artery disease Stable Continue Eliquis   Protein-calorie malnutrition, severe Severe protein calorie malnutrition in the setting of chronic illness Continue Ensure Plus high-protein twice daily Continue MVI  and zinc  sulfate   DVT prophylaxis: apixaban  Code Status: DNR Family Communication: none present Disposition Plan:   Consultants:    Procedures:   Antimicrobials:    Objective: Vitals:   06/07/24 0330 06/07/24 0331 06/07/24 0813 06/07/24 0949  BP:  (!) 115/57 (!) 142/63 (!) 113/59  Pulse: (!) 56 (!) 59 81 60  Resp:  17 16 16   Temp:  (!) 97.4 F (36.3 C) 97.6 F (36.4 C) 98.1 F (36.7 C)  TempSrc:  Oral Oral Oral  SpO2: 98% 98% 99% 98%  Weight:  59.2 kg    Height:        Intake/Output Summary (Last 24 hours) at 06/07/2024 1151 Last data filed at 06/07/2024 0921 Gross per 24 hour  Intake 360 ml  Output 1050 ml  Net -690 ml   Filed Weights   06/05/24 0600 06/06/24 0352 06/07/24 0331  Weight: 59.6 kg 60.5 kg 59.2 kg    Examination:  Gen: Awake, Alert, Oriented X 3, chronically ill-appearing HEENT: Positive JVD Lungs: Decreased breath sounds to bases CVS: S1S2/RRR Abd: soft, Non tender, non distended, BS present Extremities: 2plus edema, L calf hematoma Skin: as above  Data Reviewed:   CBC: Recent Labs  Lab 06/04/24 1305 06/05/24 0328 06/07/24 0232  WBC 3.2* 5.9 5.9  NEUTROABS  --   --  3.5  HGB 16.6* 10.7* 9.8*  HCT 49.7* 32.8* 30.5*  MCV 92.4 92.9 93.8  PLT 70* 133* 107*   Basic Metabolic Panel: Recent Labs  Lab 06/04/24 1305 06/05/24 0328 06/06/24 0232 06/07/24 0232  NA 138 140 141 140  K 3.7 3.1* 3.3* 3.5  CL 103 106 106 105  CO2 24 23 26 24   GLUCOSE 106* 114* 101* 94  BUN 31* 28* 27* 27*  CREATININE 1.55* 1.37* 1.40* 1.35*  CALCIUM  9.5 8.8* 8.5* 8.4*  MG  --   --  2.1  --    GFR: Estimated Creatinine Clearance: 24.7 mL/min (A) (by C-G formula based on SCr of 1.35 mg/dL (H)). Liver Function Tests: Recent Labs  Lab 06/04/24 1305 06/07/24 0232  AST 29 24  ALT 12 11  ALKPHOS 108 77  BILITOT 0.3 0.6  PROT 5.6* 4.6*  ALBUMIN  2.7* 1.7*   No results for input(s): LIPASE, AMYLASE in the last 168 hours. No results for  input(s): AMMONIA in the last 168 hours. Coagulation Profile: No results for input(s): INR, PROTIME in the last 168 hours. Cardiac Enzymes: No results for input(s): CKTOTAL, CKMB, CKMBINDEX, TROPONINI in the last 168 hours. BNP (last 3 results) Recent Labs    04/15/24 1604 05/14/24 0731 06/04/24 1305  PROBNP 14,671.0* 15,910.0* 28,847.0*   HbA1C: No results for input(s): HGBA1C in the last 72 hours. CBG: No results for input(s): GLUCAP in the last 168 hours. Lipid Profile: No results for input(s): CHOL, HDL, LDLCALC, TRIG, CHOLHDL, LDLDIRECT in the last 72 hours. Thyroid  Function Tests: No results for input(s): TSH, T4TOTAL, FREET4, T3FREE, THYROIDAB in the last 72 hours. Anemia Panel: No results for input(s): VITAMINB12, FOLATE, FERRITIN, TIBC, IRON , RETICCTPCT in the last 72 hours. Urine analysis:    Component Value Date/Time   COLORURINE YELLOW 06/04/2024 1505   APPEARANCEUR CLEAR 06/04/2024 1505   APPEARANCEUR Cloudy (A) 07/22/2022 1513   LABSPEC 1.009 06/04/2024 1505   PHURINE 6.0 06/04/2024 1505   GLUCOSEU NEGATIVE 06/04/2024 1505   HGBUR SMALL (A) 06/04/2024 1505   BILIRUBINUR NEGATIVE 06/04/2024 1505   BILIRUBINUR positive 07/11/2023 1453   BILIRUBINUR Negative 07/22/2022 1513   KETONESUR NEGATIVE 06/04/2024 1505   PROTEINUR >300 (A) 06/04/2024 1505   UROBILINOGEN 1.0 07/11/2023 1453   UROBILINOGEN 0.2 03/15/2015 2151   NITRITE NEGATIVE 06/04/2024 1505   LEUKOCYTESUR NEGATIVE 06/04/2024 1505   Sepsis Labs: @LABRCNTIP (procalcitonin:4,lacticidven:4)  )No results found for this or any previous visit (from the past 240 hours).   Radiology Studies: No results found.    Scheduled Meds:  ammonium lactate    Topical BID   apixaban   2.5 mg Oral BID   cholecalciferol   1,000 Units Oral Daily   diazepam   5 mg Oral QHS   empagliflozin   10 mg Oral Daily   estradiol   1 Applicatorful Vaginal Q M,W,F-2000   feeding  supplement  237 mL Oral BID BM   furosemide   80 mg Intravenous Q12H   midodrine   10 mg Oral TID WC   montelukast   5 mg Oral QHS   potassium chloride   40 mEq Oral BID   pregabalin   100 mg Oral QHS   Continuous Infusions:   LOS: 3 days    Time spent:    Sigurd Pac, MD [facility]   06/07/2024, 11:51 AM

## 2024-06-07 NOTE — Evaluation (Signed)
 Physical Therapy Evaluation Patient Details Name: Catherine [name] MRN: [id number] DOB: [birth year] Today's Date: 06/07/2024  History of Present Illness  Pt is 89 yo with recent hospitalization on 8/18 presenting to [facility] on 9/8 due to worsening bil LE swelling and dyspnea. PMH: Afib on Eliquis , PPM, COPD, MVR, CAD s/p CABG, HTN, HLD, HFpEF, chronic back pain with past surgeries, CKD  Clinical Impression  Pt is presenting currently at [facility] for sit to stand and gait. Pt has multidirectional LOB and requiring intermittent unilateral UE support on walls, rails and the sink for balance as well as multiple standing rest breaks fro 60-90 seconds due to dyspnea during 300 ft of gait. Due to pt current functional status, home set up and available assistance at home recommending skilled physical therapy services 3x/week in order to address strength, balance and functional mobility to decrease risk for falls, injury and re-hospitalization.           If plan is discharge home, recommend the following: A little help with walking and/or transfers;Assist for transportation;Assistance with cooking/housework     Equipment Recommendations None recommended by PT     Functional Status Assessment Patient has had a recent decline in their functional status and demonstrates the ability to make significant improvements in function in a reasonable and predictable amount of time.     Precautions / Restrictions Precautions Precautions: Fall Recall of Precautions/Restrictions: Intact Restrictions Weight Bearing Restrictions Per Provider Order: No      Mobility  Bed Mobility     General bed mobility comments: Received in recliner    Transfers Overall transfer level: Needs assistance Equipment used: None Transfers: Sit to/from Stand Sit to Stand: Contact guard assist           General transfer comment: CGA for stability    Ambulation/Gait Ambulation/Gait assistance: Contact guard assist Gait Distance  (Feet): 300 Feet Assistive device:  (intermittent use of UE on rails in hall, sink, wall) Gait Pattern/deviations: Step-through pattern, Decreased stride length Gait velocity: decreased Gait velocity interpretation: <1.31 ft/sec, indicative of household ambulator   General Gait Details: very slow gait with multiple standing rest breaks of 1 min to 90 seconds on 2L O2 via [place]     Balance Overall balance assessment: Needs assistance Sitting-balance support: Single extremity supported, Feet supported Sitting balance-Leahy Scale: Good     Standing balance support: Single extremity supported, During functional activity Standing balance-Leahy Scale: Fair Standing balance comment: no overt LOB, multi directional sway, intermittent UE support for balance. CGA for safety       Pertinent Vitals/Pain Pain Assessment Pain Assessment: No/denies pain    Home Living Family/patient expects to be discharged to:: Private residence Living Arrangements: Spouse/significant other Available Help at Discharge: Family;Available 24 hours/day Type of Home: House Home Access: Level entry       Home Layout: One level Home Equipment: Agricultural consultant (2 wheels);Cane - single point;Shower seat      Prior Function Prior Level of Function : Independent/Modified Independent;Driving             Mobility Comments: No DME, no falls ADLs Comments: independent     Extremity/Trunk Assessment   Upper Extremity Assessment Upper Extremity Assessment: Overall WFL for tasks assessed;Defer to OT evaluation    Lower Extremity Assessment Lower Extremity Assessment: Overall WFL for tasks assessed    Cervical / Trunk Assessment Cervical / Trunk Assessment: Kyphotic  Communication   Communication Communication: No apparent difficulties    Cognition Arousal: Alert Behavior During Therapy:  WFL for tasks assessed/performed   PT - Cognitive impairments: No apparent impairments   Following commands:  Intact       Cueing Cueing Techniques: Verbal cues     General Comments General comments (skin integrity, edema, etc.): VSS on 2L o2 via [place].        Assessment/Plan    PT Assessment Patient needs continued PT services  PT Problem List Decreased balance;Decreased activity tolerance;Decreased strength       PT Treatment Interventions DME instruction;Balance training;Gait training;Functional mobility training;Therapeutic activities;Therapeutic exercise;Patient/family education    PT Goals (Current goals can be found in the Care Plan section)  Acute Rehab PT Goals Patient Stated Goal: to return home and do HHPT PT Goal Formulation: With patient Time For Goal Achievement: 06/21/24 Potential to Achieve Goals: Good    Frequency Min 2X/week        AM-PAC PT 6 Clicks Mobility  Outcome Measure Help needed turning from your back to your side while in a flat bed without using bedrails?: A Little Help needed moving from lying on your back to sitting on the side of a flat bed without using bedrails?: A Little Help needed moving to and from a bed to a chair (including a wheelchair)?: A Little Help needed standing up from a chair using your arms (e.g., wheelchair or bedside chair)?: A Little Help needed to walk in hospital room?: A Little Help needed climbing 3-5 steps with a railing? : A Little 6 Click Score: 18    End of Session Equipment Utilized During Treatment: Gait belt;Oxygen  Activity Tolerance: Patient tolerated treatment well Patient left: in chair;with call bell/phone within reach;with chair alarm set Nurse Communication: Mobility status PT Visit Diagnosis: Other abnormalities of gait and mobility (R26.89);Unsteadiness on feet (R26.81);Muscle weakness (generalized) (M62.81)    Time: 8473-8444 PT Time Calculation (min) (ACUTE ONLY): 29 min   Charges:   PT Evaluation $PT Eval Low Complexity: 1 Low PT Treatments $Therapeutic Activity: 8-22 mins PT General  Charges $$ ACUTE PT VISIT: 1 Visit       Dorothyann Maier, DPT, CLT  Acute Rehabilitation Services Office: [phone] (Secure chat preferred)   Dorothyann VEAR Maier 06/07/2024, 4:32 PM

## 2024-06-08 DIAGNOSIS — E8809 Other disorders of plasma-protein metabolism, not elsewhere classified: Secondary | ICD-10-CM

## 2024-06-08 DIAGNOSIS — R600 Localized edema: Secondary | ICD-10-CM | POA: Diagnosis not present

## 2024-06-08 DIAGNOSIS — C9000 Multiple myeloma not having achieved remission: Secondary | ICD-10-CM | POA: Diagnosis not present

## 2024-06-08 DIAGNOSIS — E854 Organ-limited amyloidosis: Secondary | ICD-10-CM | POA: Diagnosis not present

## 2024-06-08 DIAGNOSIS — I5033 Acute on chronic diastolic (congestive) heart failure: Secondary | ICD-10-CM | POA: Diagnosis not present

## 2024-06-08 LAB — CBC WITH DIFFERENTIAL/PLATELET
Abs Immature Granulocytes: 0.02 K/uL (ref 0.00–0.07)
Basophils Absolute: 0.1 K/uL (ref 0.0–0.1)
Basophils Relative: 1 %
Eosinophils Absolute: 0.3 K/uL (ref 0.0–0.5)
Eosinophils Relative: 4 %
HCT: 33.4 % — ABNORMAL LOW (ref 36.0–46.0)
Hemoglobin: 11.1 g/dL — ABNORMAL LOW (ref 12.0–15.0)
Immature Granulocytes: 0 %
Lymphocytes Relative: 21 %
Lymphs Abs: 1.5 K/uL (ref 0.7–4.0)
MCH: 30.5 pg (ref 26.0–34.0)
MCHC: 33.2 g/dL (ref 30.0–36.0)
MCV: 91.8 fL (ref 80.0–100.0)
Monocytes Absolute: 0.8 K/uL (ref 0.1–1.0)
Monocytes Relative: 11 %
Neutro Abs: 4.6 K/uL (ref 1.7–7.7)
Neutrophils Relative %: 63 %
Platelets: 112 K/uL — ABNORMAL LOW (ref 150–400)
RBC: 3.64 MIL/uL — ABNORMAL LOW (ref 3.87–5.11)
RDW: 13.7 % (ref 11.5–15.5)
WBC: 7.3 K/uL (ref 4.0–10.5)
nRBC: 0 % (ref 0.0–0.2)

## 2024-06-08 LAB — COMPREHENSIVE METABOLIC PANEL WITH GFR
ALT: 13 U/L (ref 0–44)
AST: 25 U/L (ref 15–41)
Albumin: 1.9 g/dL — ABNORMAL LOW (ref 3.5–5.0)
Alkaline Phosphatase: 77 U/L (ref 38–126)
Anion gap: 8 (ref 5–15)
BUN: 21 mg/dL (ref 8–23)
CO2: 27 mmol/L (ref 22–32)
Calcium: 8.5 mg/dL — ABNORMAL LOW (ref 8.9–10.3)
Chloride: 106 mmol/L (ref 98–111)
Creatinine, Ser: 1.35 mg/dL — ABNORMAL HIGH (ref 0.44–1.00)
GFR, Estimated: 38 mL/min — ABNORMAL LOW (ref >60)
Glucose, Bld: 105 mg/dL — ABNORMAL HIGH (ref 70–99)
Potassium: 3.5 mmol/L (ref 3.5–5.1)
Sodium: 141 mmol/L (ref 135–145)
Total Bilirubin: 0.4 mg/dL (ref 0.0–1.2)
Total Protein: 4.9 g/dL — ABNORMAL LOW (ref 6.5–8.1)

## 2024-06-08 LAB — PREALBUMIN: Prealbumin: 12 mg/dL — ABNORMAL LOW (ref 18–38)

## 2024-06-08 MED ORDER — ALBUMIN HUMAN 25 % IV SOLN
25.0000 g | Freq: Four times a day (QID) | INTRAVENOUS | Status: AC
  Administered 2024-06-08: 12.5 g via INTRAVENOUS
  Administered 2024-06-08: 25 g via INTRAVENOUS
  Filled 2024-06-08 (×2): qty 100

## 2024-06-08 MED ORDER — DIAZEPAM 2 MG PO TABS: 2.0000 mg | ORAL_TABLET | Freq: Every evening | ORAL | Status: DC | PRN

## 2024-06-08 MED ORDER — FUROSEMIDE 10 MG/ML IJ SOLN
80.0000 mg | Freq: Three times a day (TID) | INTRAMUSCULAR | Status: DC
  Administered 2024-06-08 – 2024-06-12 (×12): 80 mg via INTRAVENOUS
  Filled 2024-06-08 (×11): qty 8

## 2024-06-08 MED ORDER — LACTULOSE 10 GM/15ML PO SOLN
20.0000 g | Freq: Two times a day (BID) | ORAL | Status: AC
  Administered 2024-06-08 (×2): 20 g via ORAL
  Filled 2024-06-08 (×2): qty 30

## 2024-06-08 NOTE — Progress Notes (Signed)
 Mobility Specialist Progress Note:    06/08/24 1009  Mobility  Activity Ambulated with assistance  Level of Assistance Contact guard assist, steadying assist  Assistive Device Other (Comment) (HHA)  Distance Ambulated (ft) 200 ft  Activity Response Tolerated well  Mobility Referral Yes  Mobility visit 1 Mobility  Mobility Specialist Start Time (ACUTE ONLY) 1009  Mobility Specialist Stop Time (ACUTE ONLY) 1017  Mobility Specialist Time Calculation (min) (ACUTE ONLY) 8 min   Received pt sitting in recliner agreeable to session. No c/o any symptoms but took 1 break near nursing station before continuing walk to finish. Pt moving well and ambulating well. Returned pt to recliner w/ all needs met.   Venetia Keel Mobility Specialist Please Neurosurgeon or Rehab Office at [phone]

## 2024-06-08 NOTE — Progress Notes (Signed)
 She is not doing too well this morning.  She does have a lot of pain in her feet.  This sounds like neuropathy.  I know she was started on Lyrica .  Hopefully, this will take effect..  She has wrappings on her legs.  I suppose that she is getting diuresis.  She can some physical therapy.  Her labs show sodium 141.  Potassium 3.5.  BUN 21 creatinine 1.35.  Calcium  8.5.  Albumin  is only 1.9.  The prealbumin a month ago was 14.  We probably need to recheck this.  Her white cell count 7.3.  Hemoglobin 11.1.  Platelet count 112,000.  Again, I do not know if she actually has cardiac amyloid.  I know that she does have myeloma.  She does have a quite elevated light chain level.  Treating her certainly would be challenging given her performance status of ECOG 3.  Again we can treat and decrease the light chains but I do not know if this is going to improve her cardiac status.  For right now, she is mostly worried about her legs.  Her feet mostly with the pain.  She says it is a burning pain.  Again I suspect this is probably some kind of neuropathy.  Of note, she says that the pain really bothers her mostly at nighttime.  She is not able to sleep all that well.  I realize that there is a lot going on.  I know that she is getting great care for everybody up on [address] [facility].  Jeralyn Crease, MD  [facility] 1:37

## 2024-06-08 NOTE — Progress Notes (Signed)
 PROGRESS NOTE    Catherine [name]  FMW:[id number] DOB: August 03, 1935 DOA: 06/04/2024 PCP: Micheal Wolm ORN, MD   88/F w chronic atrial fibrillation, hypertension, sp bioprosthetic MVR, CAD CABG, stage IIIa chronic kidney disease, COPD and recently diagnosed multiple myeloma who was sent to the hospital by her cardiologist for worsening bilateral lower extremity swelling, dyspnea. She also complains of a hematoma in her left leg. fourth hospitalization in the last 2 months Abnormal labs include BUN of 31, creatinine of 1.55 compared to baseline of 1.17, proBNP 28,000 847, platelet count 70, hemoglobin 16.6, hematocrit 49.7 Chest x-ray reviewed by me shows bilateral pleural effusions with interval slight decrease in moderate right-sided pleural effusion. Stable small left pleural effusion,   Subjective: - Feels fair overall, sitting up eating breakfast  Assessment and Plan:   Acute on chronic diastolic CHF (congestive heart failure) (HCC) Last 2D echocardiogram from 07/25 showed an LVEF of 70 to 75% with mild to moderate aortic valve stenosis - Has significant hypoalbuminemia contributing to her edema  - 2.7 L negative, Lasix  increased to 80 mg 3 times daily, continue jardiance , midodrine  -Add albumin  X2 doses today -Continue Unna boots -Poor prognosis, Palliative following, high risk of quick readmission  Multiple myeloma (HCC)/Amyloidosis -poor prognosis. Dr.Ennever following, considering Rx  P Atrial fibrillation (HCC) -continue eliquis  - Has had paroxysmal SVT as well, history of intolerance to beta-blockers, was planned to have SVT ablation, postponed with frequent admissions  Chronic kidney disease, stage 3a (HCC) stable  COPD  GOLD 2 Continue PRN bronchodilator therapy and inhaled steroids  Coronary artery disease Stable Continue Eliquis   Severe hypoalbuminemia -Secondary to myeloma, proteinuria and malnutrition Protein-calorie malnutrition, severe Severe protein calorie  malnutrition in the setting of chronic illness Continue Ensure Plus high-protein twice daily Continue MVI and zinc  sulfate   DVT prophylaxis: apixaban  Code Status: DNR Family Communication: none present Disposition Plan: Home likely 2 to 3 days  Consultants:    Procedures:   Antimicrobials:    Objective: Vitals:   06/08/24 0009 06/08/24 0300 06/08/24 0356 06/08/24 0741  BP: 132/63  (!) 106/40 (!) 127/56  Pulse: (!) 104  (!) 59 62  Resp: 17  16 17   Temp: 98.3 F (36.8 C) (!) 97.2 F (36.2 C) (!) 97.2 F (36.2 C) (!) 97.4 F (36.3 C)  TempSrc: Oral Oral Oral Oral  SpO2: 99%  100% 99%  Weight:  58.7 kg    Height:        Intake/Output Summary (Last 24 hours) at 06/08/2024 1153 Last data filed at 06/07/2024 2000 Gross per 24 hour  Intake 660 ml  Output 1025 ml  Net -365 ml   Filed Weights   06/06/24 0352 06/07/24 0331 06/08/24 0300  Weight: 60.5 kg 59.2 kg 58.7 kg    Examination:  Gen: Awake, Alert, Oriented X 3, chronically ill-appearing HEENT: Positive JVD Lungs: Decreased breath sounds to bases CVS: S1S2/RRR Abd: soft, Non tender, non distended, BS present Extremities: Unna boots, 1-2+ edema extending to upper thighs Skin: as above  Data Reviewed:   CBC: Recent Labs  Lab 06/04/24 1305 06/05/24 0328 06/07/24 0232 06/08/24 0236  WBC 3.2* 5.9 5.9 7.3  NEUTROABS  --   --  3.5 4.6  HGB 16.6* 10.7* 9.8* 11.1*  HCT 49.7* 32.8* 30.5* 33.4*  MCV 92.4 92.9 93.8 91.8  PLT 70* 133* 107* 112*   Basic Metabolic Panel: Recent Labs  Lab 06/04/24 1305 06/05/24 0328 06/06/24 0232 06/07/24 0232 06/08/24 0236  NA 138 140 141  140 141  K 3.7 3.1* 3.3* 3.5 3.5  CL 103 106 106 105 106  CO2 24 23 26 24 27   GLUCOSE 106* 114* 101* 94 105*  BUN 31* 28* 27* 27* 21  CREATININE 1.55* 1.37* 1.40* 1.35* 1.35*  CALCIUM  9.5 8.8* 8.5* 8.4* 8.5*  MG  --   --  2.1  --   --    GFR: Estimated Creatinine Clearance: 24.7 mL/min (A) (by C-G formula based on SCr of 1.35  mg/dL (H)). Liver Function Tests: Recent Labs  Lab 06/04/24 1305 06/07/24 0232 06/08/24 0236  AST 29 24 25   ALT 12 11 13   ALKPHOS 108 77 77  BILITOT 0.3 0.6 0.4  PROT 5.6* 4.6* 4.9*  ALBUMIN  2.7* 1.7* 1.9*   No results for input(s): LIPASE, AMYLASE in the last 168 hours. No results for input(s): AMMONIA in the last 168 hours. Coagulation Profile: No results for input(s): INR, PROTIME in the last 168 hours. Cardiac Enzymes: No results for input(s): CKTOTAL, CKMB, CKMBINDEX, TROPONINI in the last 168 hours. BNP (last 3 results) Recent Labs    04/15/24 1604 05/14/24 0731 06/04/24 1305  PROBNP 14,671.0* 15,910.0* 28,847.0*   HbA1C: No results for input(s): HGBA1C in the last 72 hours. CBG: No results for input(s): GLUCAP in the last 168 hours. Lipid Profile: No results for input(s): CHOL, HDL, LDLCALC, TRIG, CHOLHDL, LDLDIRECT in the last 72 hours. Thyroid  Function Tests: No results for input(s): TSH, T4TOTAL, FREET4, T3FREE, THYROIDAB in the last 72 hours. Anemia Panel: No results for input(s): VITAMINB12, FOLATE, FERRITIN, TIBC, IRON , RETICCTPCT in the last 72 hours. Urine analysis:    Component Value Date/Time   COLORURINE YELLOW 06/04/2024 1505   APPEARANCEUR CLEAR 06/04/2024 1505   APPEARANCEUR Cloudy (A) 07/22/2022 1513   LABSPEC 1.009 06/04/2024 1505   PHURINE 6.0 06/04/2024 1505   GLUCOSEU NEGATIVE 06/04/2024 1505   HGBUR SMALL (A) 06/04/2024 1505   BILIRUBINUR NEGATIVE 06/04/2024 1505   BILIRUBINUR positive 07/11/2023 1453   BILIRUBINUR Negative 07/22/2022 1513   KETONESUR NEGATIVE 06/04/2024 1505   PROTEINUR >300 (A) 06/04/2024 1505   UROBILINOGEN 1.0 07/11/2023 1453   UROBILINOGEN 0.2 03/15/2015 2151   NITRITE NEGATIVE 06/04/2024 1505   LEUKOCYTESUR NEGATIVE 06/04/2024 1505   Sepsis Labs: @LABRCNTIP (procalcitonin:4,lacticidven:4)  )No results found for this or any previous visit (from the past  240 hours).   Radiology Studies: No results found.    Scheduled Meds:  ammonium lactate    Topical BID   apixaban   2.5 mg Oral BID   cholecalciferol   1,000 Units Oral Daily   diazepam   5 mg Oral QHS   empagliflozin   10 mg Oral Daily   estradiol   1 Applicatorful Vaginal Q M,W,F-2000   feeding supplement  237 mL Oral BID BM   furosemide   80 mg Intravenous TID   lactulose   20 g Oral BID   midodrine   10 mg Oral TID WC   montelukast   5 mg Oral QHS   pregabalin   100 mg Oral QHS   Continuous Infusions:  albumin  human       LOS: 4 days    Time spent:    Sigurd Pac, MD [facility]   06/08/2024, 11:53 AM

## 2024-06-08 NOTE — Plan of Care (Incomplete)
   Problem: Education: Goal: Knowledge of General Education information will improve Description Including pain rating scale, medication(s)/side effects and non-pharmacologic comfort measures Outcome: Progressing   Problem: Health Behavior/Discharge Planning: Goal: Ability to manage health-related needs will improve Outcome: Progressing

## 2024-06-08 NOTE — Evaluation (Signed)
 Occupational Therapy Evaluation & Discharge Patient Details Name: Catherine [name] MRN: [id number] DOB: 27-Jun-1935 Today's Date: 06/08/2024   History of Present Illness   Pt is 89 yo with recent hospitalization on 8/18 presenting to [facility] on 9/8 due to worsening bil LE swelling and dyspnea. Recently diagnosed multiple myeloma. PMH: Afib on Eliquis , PPM, COPD, MVR, CAD s/p CABG, HTN, HLD, HFpEF, chronic back pain with past surgeries, CKD     Clinical Impressions OT services consulted d/t pt with new oncology dx and decreased respiratory status. This pt well known to this OT, and at baseline is ind with ADLs and IADLs. Today pt is at mod I for ADLs, requiring increased time and rest breaks with new O2 requirement. OT provided and reviewed energy conservation educational handout with pt. OT educated pt on use of compensatory strategies, pursed lip breathing, and encouraged use of shower chair to promote energy conservation with ADLs. Pt reporting no concerns with self-care upon d/c, no follow up OT needs. OT continued to encourage self-pacing, rest breaks, and self-monitoring of O2 at home upon d/c. All education complete, no further acute OT needs, OT is signing off.        If plan is discharge home, recommend the following:   Assistance with cooking/housework     Functional Status Assessment   Patient has not had a recent decline in their functional status     Equipment Recommendations   None recommended by OT      Precautions/Restrictions   Precautions Precautions: Fall Recall of Precautions/Restrictions: Intact Restrictions Weight Bearing Restrictions Per Provider Order: No     Mobility Bed Mobility   General bed mobility comments: Received in recliner    Transfers Overall transfer level: Needs assistance Equipment used: None Transfers: Sit to/from Stand Sit to Stand: Supervision           General transfer comment: S for safety      Balance Overall  balance assessment: Needs assistance Sitting-balance support: No upper extremity supported, Feet supported Sitting balance-Leahy Scale: Good     Standing balance support: No upper extremity supported Standing balance-Leahy Scale: Fair Standing balance comment: No LOB or sway       ADL either performed or assessed with clinical judgement   ADL Overall ADL's : Modified independent;At baseline     General ADL Comments: Mod I able to complete standing ADLs and LB ADLs, educated on use of compensatory strategies     Vision Baseline Vision/History: 0 No visual deficits Patient Visual Report: No change from baseline Vision Assessment?: No apparent visual deficits            Pertinent Vitals/Pain Pain Assessment Pain Assessment: No/denies pain     Extremity/Trunk Assessment Upper Extremity Assessment Upper Extremity Assessment: Overall WFL for tasks assessed   Lower Extremity Assessment Lower Extremity Assessment: Defer to PT evaluation   Cervical / Trunk Assessment Cervical / Trunk Assessment: Kyphotic   Communication Communication Communication: No apparent difficulties   Cognition Arousal: Alert Behavior During Therapy: WFL for tasks assessed/performed Cognition: No apparent impairments       Following commands: Intact       Cueing  General Comments   Cueing Techniques: Verbal cues  VSS on 3L. Access Code: [email or web address]  URL: [email or web address]  Date: 06/08/2024  Prepared by: Adrianne Savers    Patient Education  - Understanding Energy Conservation  - Energy Conservation During Daily Tasks           Home Living Family/patient expects to be  discharged to:: Private residence Living Arrangements: Spouse/significant other Available Help at Discharge: Family;Available 24 hours/day Type of Home: House Home Access: Level entry     Home Layout: One level     Bathroom Shower/Tub: Producer, television/film/video: Standard Bathroom  Accessibility: Yes How Accessible: Accessible via walker Home Equipment: Rolling Walker (2 wheels);Cane - single point;Shower seat          Prior Functioning/Environment Prior Level of Function : Independent/Modified Independent;Driving       Mobility Comments: No DME, no falls ADLs Comments: independent    OT Problem List: Cardiopulmonary status limiting activity;Decreased activity tolerance        OT Goals(Current goals can be found in the care plan section)   Acute Rehab OT Goals Patient Stated Goal: To improve respiratory status OT Goal Formulation: All assessment and education complete, DC therapy Time For Goal Achievement: 06/22/24 Potential to Achieve Goals: Good   AM-PAC OT 6 Clicks Daily Activity     Outcome Measure Help from another person eating meals?: None Help from another person taking care of personal grooming?: None Help from another person toileting, which includes using toliet, bedpan, or urinal?: None Help from another person bathing (including washing, rinsing, drying)?: None Help from another person to put on and taking off regular upper body clothing?: None Help from another person to put on and taking off regular lower body clothing?: None 6 Click Score: 24   End of Session Equipment Utilized During Treatment: Oxygen  Nurse Communication: Mobility status  Activity Tolerance: Patient tolerated treatment well Patient left: in chair;with call bell/phone within reach  OT Visit Diagnosis: Muscle weakness (generalized) (M62.81)                Time: 9254-9197 OT Time Calculation (min): 17 min Charges:  OT General Charges $OT Visit: 1 Visit OT Evaluation $OT Eval Moderate Complexity: 1 Mod  Warda Mcqueary C, OT  Acute Rehabilitation Services Office [phone] Secure chat preferred   Adrianne GORMAN Savers 06/08/2024, 8:15 AM

## 2024-06-08 NOTE — Progress Notes (Signed)
 Rounding Note    Patient Name: Catherine [name] Date of Encounter: 06/08/2024  [facility] Cardiologist: Annabella Scarce, MD   Subjective   She is frustrated, having leg pain, doesn't want to be in the hospital. Was able to walk to nurse's station and back without O2 on. We had a long discussion about where were are with her fluid, options for management, etc. See below. I also attempted to call her daughter Mliss to give an update, left a message.  Inpatient Medications    Scheduled Meds:  ammonium lactate    Topical BID   apixaban   2.5 mg Oral BID   cholecalciferol   1,000 Units Oral Daily   diazepam   5 mg Oral QHS   empagliflozin   10 mg Oral Daily   estradiol   1 Applicatorful Vaginal Q M,W,F-2000   feeding supplement  237 mL Oral BID BM   furosemide   80 mg Intravenous TID   lactulose   20 g Oral BID   midodrine   10 mg Oral TID WC   montelukast   5 mg Oral QHS   pregabalin   100 mg Oral QHS   Continuous Infusions:  albumin  human     PRN Meds: acetaminophen , albuterol , HYDROcodone -acetaminophen , ondansetron  **OR** ondansetron  (ZOFRAN ) IV, sodium chloride    Vital Signs    Vitals:   06/08/24 0009 06/08/24 0300 06/08/24 0356 06/08/24 0741  BP: 132/63  (!) 106/40 (!) 127/56  Pulse: (!) 104  (!) 59 62  Resp: 17  16 17   Temp: 98.3 F (36.8 C) (!) 97.2 F (36.2 C) (!) 97.2 F (36.2 C) (!) 97.4 F (36.3 C)  TempSrc: Oral Oral Oral Oral  SpO2: 99%  100% 99%  Weight:  58.7 kg    Height:        Intake/Output Summary (Last 24 hours) at 06/08/2024 1042 Last data filed at 06/07/2024 2000 Gross per 24 hour  Intake 660 ml  Output 1025 ml  Net -365 ml      06/08/2024    3:00 AM 06/07/2024    3:31 AM 06/06/2024    3:52 AM  Last 3 Weights  Weight (lbs) 129 lb 8 oz 130 lb 8.2 oz 133 lb 6.1 oz  Weight (kg) 58.741 kg 59.2 kg 60.5 kg      Telemetry    Frequently AV paced. Fast rhythms are intermittent, do appear to have pacer spikes overlying them frequently -  Personally Reviewed  Physical Exam   GEN: No acute distress.  Frail appearing, but on room air today rather than [place]: JVD elevated to low neck sitting upright Cardiac: largely regular, no murmurs, rubs, or gallops.  Respiratory: Clear to auscultation bilaterally in upper fields, diminished at bilateral bases GI: Soft, nontender, non-distended  MS: Bilateral LE wrapped. Severe pitting edema to thighs/hips but no longer actively leaking clear fluid through wraps Neuro:  Nonfocal  Psych: Normal affect   New pertinent results (labs, ECG, imaging, cardiac studies)     Assessment & Plan    Acute on chronic diastolic heart failure Third spacing due to nephrotic range proteinuria and hypoalbuminemia Multiple myeloma with AL amyloidosis Chronic kidney disease, was stage 3a, labs here more consistent with stage 3b -presenting with shortness of breath, worsening LE edema, and weight gain despite aggressive outpatient diuretic regimen -we discussed role of nephrotic range proteinuria and AL amyloidosis in her symptoms on multiple occasions. She discussed chemo for her multiple myeloma after discharge -consulted wound care team for her lower extremities -her renal function is  stable, receiving potassium repletion. Her albumin  is very low, which is contributing to her third spacing.  -admission weight 62.2 kg, weight today 58.7 kg. I/O incompletely captured but this supports that she is diuresing well. She is now able to speak without being conversationally dyspneic, no longer on O2. We have discussed that she still has probably several liters of fluid on and will need continued diuresis. We have discussed palliative care, and she met with them this admission. We discussed that ultimately it is her choice on what she would like to do--remaining hospitalized for diuresis and aiming for chemo after discharge is one option, but we have also discussed that palliative care/focusing on symptom management  is an option as well.  -she wants to pursue aggressive treatment but is tired of the hospital. For now she is willing to continue IV lasix . She has tolerated IV BID 80 mg lasix , with stable blood pressure and renal function. Will change to TID dosing today to see if this speeds up diuresis for her, but will need to drop back to BID if her renal function bumps or her blood pressure drops   Paroxysmal atrial fibrillation Paroxysmal SVT Sinus node dysfunction s/p PPM -has not tolerated beta blocker -was planned for SVT ablation but this was postponed due to recurrent hospitalizations -has intermittent brief tachycardia on monitor. I have asked EP to evaluate her device as frequently these have overlying pacer spikes--need to exclude pacemaker mediate tachycardia -continue current dose of DOAC   CAD -with prior 1V CABG   History of mitral valve disease s/p MVR Mild-moderate aortic stenosis -reviewed most recent echo, findings do not support valve disease as significant contributor to current symptoms   Chronic hypotension -on midodrine   We had extensive bedside discussion today regarding options for management as above. I also contacted her daughter. For now she is amenable to remaining in the hospital with continued diuresis. Total time in patient care 55 minutes.    Signed, Shelda Bruckner, MD  06/08/2024, 10:42 AM

## 2024-06-09 DIAGNOSIS — I5033 Acute on chronic diastolic (congestive) heart failure: Secondary | ICD-10-CM | POA: Diagnosis not present

## 2024-06-09 LAB — BASIC METABOLIC PANEL WITH GFR
Anion gap: 13 (ref 5–15)
BUN: 29 mg/dL — ABNORMAL HIGH (ref 8–23)
CO2: 26 mmol/L (ref 22–32)
Calcium: 8.6 mg/dL — ABNORMAL LOW (ref 8.9–10.3)
Chloride: 102 mmol/L (ref 98–111)
Creatinine, Ser: 1.38 mg/dL — ABNORMAL HIGH (ref 0.44–1.00)
GFR, Estimated: 37 mL/min — ABNORMAL LOW (ref >60)
Glucose, Bld: 101 mg/dL — ABNORMAL HIGH (ref 70–99)
Potassium: 2.8 mmol/L — ABNORMAL LOW (ref 3.5–5.1)
Sodium: 141 mmol/L (ref 135–145)

## 2024-06-09 MED ORDER — POTASSIUM CHLORIDE CRYS ER 20 MEQ PO TBCR
40.0000 meq | EXTENDED_RELEASE_TABLET | ORAL | Status: AC
  Administered 2024-06-09 (×4): 40 meq via ORAL
  Filled 2024-06-09 (×2): qty 2

## 2024-06-09 MED ORDER — METOLAZONE 5 MG PO TABS
5.0000 mg | ORAL_TABLET | Freq: Once | ORAL | Status: AC
  Administered 2024-06-09: 5 mg via ORAL
  Filled 2024-06-09: qty 1

## 2024-06-09 MED ORDER — POTASSIUM CHLORIDE CRYS ER 20 MEQ PO TBCR
40.0000 meq | EXTENDED_RELEASE_TABLET | ORAL | Status: AC
  Administered 2024-06-09: 40 meq via ORAL
  Filled 2024-06-09: qty 2

## 2024-06-09 NOTE — Plan of Care (Signed)

## 2024-06-09 NOTE — Progress Notes (Signed)
 PROGRESS NOTE    Catherine [name]  FMW:[id number] DOB: May 17, 1935 DOA: 06/04/2024 PCP: Micheal Wolm ORN, MD   88/F w chronic atrial fibrillation, hypertension, sp bioprosthetic MVR, CAD CABG, stage IIIa chronic kidney disease, COPD and recently diagnosed multiple myeloma who was sent to the hospital by her cardiologist for worsening bilateral lower extremity swelling, dyspnea. She also complains of a hematoma in her left leg. fourth hospitalization in the last 2 months Abnormal labs include BUN of 31, creatinine of 1.55 compared to baseline of 1.17, proBNP 28,000 847, platelet count 70, hemoglobin 16.6, hematocrit 49.7 Chest x-ray reviewed by me shows bilateral pleural effusions with interval slight decrease in moderate right-sided pleural effusion. Stable small left pleural effusion,   Subjective: - Continues to have poor response to diuretics  Assessment and Plan:   Acute on chronic diastolic CHF (congestive heart failure) (HCC) Last 2D echocardiogram from 07/25 showed an LVEF of 70 to 75% with mild to moderate aortic valve stenosis - Has significant hypoalbuminemia contributing to her edema  - 3.2 L negative, continue higher dose Lasix  3 times daily, Jardiance , midodrine   -Add metolazone  today -Continue Unna boots -4th admission in 3months -Poor prognosis, Palliative following, high risk of quick readmission - Discussed hospice option with patient again  Multiple myeloma (HCC)/Amyloidosis -poor prognosis. Dr.Ennever following, considering Rx  P Atrial fibrillation (HCC) -continue eliquis  - Has had paroxysmal SVT as well, history of intolerance to beta-blockers, was planned to have SVT ablation, postponed with frequent admissions  Chronic kidney disease, stage 3a (HCC) stable  COPD  GOLD 2 Continue PRN bronchodilator therapy and inhaled steroids  Coronary artery disease Stable Continue Eliquis   Severe hypoalbuminemia -Secondary to myeloma, proteinuria and  malnutrition Protein-calorie malnutrition, severe Severe protein calorie malnutrition in the setting of chronic illness Continue Ensure Plus high-protein twice daily Continue MVI and zinc  sulfate   DVT prophylaxis: apixaban  Code Status: DNR Family Communication: none present Disposition Plan: Home likely 48 hours  Consultants:    Procedures:   Antimicrobials:    Objective: Vitals:   06/08/24 2350 06/09/24 0320 06/09/24 0428 06/09/24 0717  BP: (!) 142/62  (!) 115/54 127/62  Pulse: 80  60 60  Resp: 18  18   Temp: 98.5 F (36.9 C)  (!) 97.4 F (36.3 C) (!) 97.5 F (36.4 C)  TempSrc: Oral  Oral Oral  SpO2: 97%  97% 100%  Weight:  59.2 kg    Height:        Intake/Output Summary (Last 24 hours) at 06/09/2024 1047 Last data filed at 06/09/2024 1019 Gross per 24 hour  Intake 420 ml  Output 650 ml  Net -230 ml   Filed Weights   06/07/24 0331 06/08/24 0300 06/09/24 0320  Weight: 59.2 kg 58.7 kg 59.2 kg    Examination:  Gen: Awake, Alert, Oriented X 3, chronically ill-appearing HEENT: Positive JVD Lungs: Decreased breath sounds to bases CVS: S1S2/RRR Abd: soft, Non tender, non distended, BS present Extremities: Unna boots, 1-2+ edema extending to upper thighs Skin: as above  Data Reviewed:   CBC: Recent Labs  Lab 06/04/24 1305 06/05/24 0328 06/07/24 0232 06/08/24 0236  WBC 3.2* 5.9 5.9 7.3  NEUTROABS  --   --  3.5 4.6  HGB 16.6* 10.7* 9.8* 11.1*  HCT 49.7* 32.8* 30.5* 33.4*  MCV 92.4 92.9 93.8 91.8  PLT 70* 133* 107* 112*   Basic Metabolic Panel: Recent Labs  Lab 06/05/24 0328 06/06/24 0232 06/07/24 0232 06/08/24 0236 06/09/24 0243  NA 140 141 140  141 141  K 3.1* 3.3* 3.5 3.5 2.8*  CL 106 106 105 106 102  CO2 23 26 24 27 26   GLUCOSE 114* 101* 94 105* 101*  BUN 28* 27* 27* 21 29*  CREATININE 1.37* 1.40* 1.35* 1.35* 1.38*  CALCIUM  8.8* 8.5* 8.4* 8.5* 8.6*  MG  --  2.1  --   --   --    GFR: Estimated Creatinine Clearance: 24.2 mL/min (A)  (by C-G formula based on SCr of 1.38 mg/dL (H)). Liver Function Tests: Recent Labs  Lab 06/04/24 1305 06/07/24 0232 06/08/24 0236  AST 29 24 25   ALT 12 11 13   ALKPHOS 108 77 77  BILITOT 0.3 0.6 0.4  PROT 5.6* 4.6* 4.9*  ALBUMIN  2.7* 1.7* 1.9*   No results for input(s): LIPASE, AMYLASE in the last 168 hours. No results for input(s): AMMONIA in the last 168 hours. Coagulation Profile: No results for input(s): INR, PROTIME in the last 168 hours. Cardiac Enzymes: No results for input(s): CKTOTAL, CKMB, CKMBINDEX, TROPONINI in the last 168 hours. BNP (last 3 results) Recent Labs    04/15/24 1604 05/14/24 0731 06/04/24 1305  PROBNP 14,671.0* 15,910.0* 28,847.0*   HbA1C: No results for input(s): HGBA1C in the last 72 hours. CBG: No results for input(s): GLUCAP in the last 168 hours. Lipid Profile: No results for input(s): CHOL, HDL, LDLCALC, TRIG, CHOLHDL, LDLDIRECT in the last 72 hours. Thyroid  Function Tests: No results for input(s): TSH, T4TOTAL, FREET4, T3FREE, THYROIDAB in the last 72 hours. Anemia Panel: No results for input(s): VITAMINB12, FOLATE, FERRITIN, TIBC, IRON , RETICCTPCT in the last 72 hours. Urine analysis:    Component Value Date/Time   COLORURINE YELLOW 06/04/2024 1505   APPEARANCEUR CLEAR 06/04/2024 1505   APPEARANCEUR Cloudy (A) 07/22/2022 1513   LABSPEC 1.009 06/04/2024 1505   PHURINE 6.0 06/04/2024 1505   GLUCOSEU NEGATIVE 06/04/2024 1505   HGBUR SMALL (A) 06/04/2024 1505   BILIRUBINUR NEGATIVE 06/04/2024 1505   BILIRUBINUR positive 07/11/2023 1453   BILIRUBINUR Negative 07/22/2022 1513   KETONESUR NEGATIVE 06/04/2024 1505   PROTEINUR >300 (A) 06/04/2024 1505   UROBILINOGEN 1.0 07/11/2023 1453   UROBILINOGEN 0.2 03/15/2015 2151   NITRITE NEGATIVE 06/04/2024 1505   LEUKOCYTESUR NEGATIVE 06/04/2024 1505   Sepsis Labs: @LABRCNTIP (procalcitonin:4,lacticidven:4)  )No results found for this  or any previous visit (from the past 240 hours).   Radiology Studies: No results found.    Scheduled Meds:  ammonium lactate    Topical BID   apixaban   2.5 mg Oral BID   cholecalciferol   1,000 Units Oral Daily   diazepam   5 mg Oral QHS   empagliflozin   10 mg Oral Daily   estradiol   1 Applicatorful Vaginal Q M,W,F-2000   feeding supplement  237 mL Oral BID BM   furosemide   80 mg Intravenous TID   metolazone   5 mg Oral Once   midodrine   10 mg Oral TID WC   montelukast   5 mg Oral QHS   potassium chloride   40 mEq Oral Q2H   pregabalin   100 mg Oral QHS   Continuous Infusions:     LOS: 5 days    Time spent:    Sigurd Pac, MD [facility]   06/09/2024, 10:47 AM

## 2024-06-09 NOTE — Progress Notes (Signed)
 Rounding Note    Patient Name: Catherine [name] Date of Encounter: 06/09/2024  [facility] Cardiologist: Annabella Scarce, MD   Subjective   Diuresing, NAEO, feels SOB today. Will be married 69 years tomorrow.  Inpatient Medications    Scheduled Meds:  ammonium lactate    Topical BID   apixaban   2.5 mg Oral BID   cholecalciferol   1,000 Units Oral Daily   diazepam   5 mg Oral QHS   empagliflozin   10 mg Oral Daily   estradiol   1 Applicatorful Vaginal Q M,W,F-2000   feeding supplement  237 mL Oral BID BM   furosemide   80 mg Intravenous TID   midodrine   10 mg Oral TID WC   montelukast   5 mg Oral QHS   potassium chloride   40 mEq Oral Q2H   pregabalin   100 mg Oral QHS   Continuous Infusions:   PRN Meds: acetaminophen , albuterol , diazepam , HYDROcodone -acetaminophen , ondansetron  **OR** ondansetron  (ZOFRAN ) IV, sodium chloride    Vital Signs    Vitals:   06/08/24 2350 06/09/24 0320 06/09/24 0428 06/09/24 0717  BP: (!) 142/62  (!) 115/54 127/62  Pulse: 80  60 60  Resp: 18  18   Temp: 98.5 F (36.9 C)  (!) 97.4 F (36.3 C) (!) 97.5 F (36.4 C)  TempSrc: Oral  Oral Oral  SpO2: 97%  97% 100%  Weight:  59.2 kg    Height:        Intake/Output Summary (Last 24 hours) at 06/09/2024 0851 Last data filed at 06/09/2024 0630 Gross per 24 hour  Intake 180 ml  Output 650 ml  Net -470 ml      06/09/2024    3:20 AM 06/08/2024    3:00 AM 06/07/2024    3:31 AM  Last 3 Weights  Weight (lbs) 130 lb 8.2 oz 129 lb 8 oz 130 lb 8.2 oz  Weight (kg) 59.2 kg 58.741 kg 59.2 kg      Telemetry    Frequently AV paced. Brief run of NSVT - Personally Reviewed  Physical Exam   GEN: No acute distress.  Frail appearing, but on room air today rather than [place]: JVD elevated to low neck sitting upright Cardiac: largely regular, no murmurs, rubs, or gallops.  Respiratory: Clear to auscultation bilaterally in upper fields, diminished at bilateral bases GI: Soft, nontender,  non-distended  MS: Bilateral LE wrapped. Severe pitting edema to thighs/hips Neuro:  Nonfocal  Psych: Normal affect   New pertinent results (labs, ECG, imaging, cardiac studies)     Assessment & Plan    Acute on chronic diastolic heart failure Third spacing due to nephrotic range proteinuria and hypoalbuminemia Multiple myeloma with AL amyloidosis Chronic kidney disease, was stage 3a, labs here more consistent with stage 3b -presenting with shortness of breath, worsening LE edema, and weight gain despite aggressive outpatient diuretic regimen -nephrotic range proteinuria and AL amyloidosis. ? chemo for her multiple myeloma after discharge, she is eager for Oncology visit. We discussed need for concomitant palliative care engagement. -consulted wound care team for her lower extremities -her renal function is stable, receiving potassium repletion. Her albumin  is very low, which is contributing to her third spacing.  -admission weight 62.2 kg, weight today 59.2 kg. I/O incompletely captured but need continued diuresis. Agree with palliative care. -she wants to pursue aggressive treatment but is tired of the hospital. For now she is willing to continue IV lasix . She has tolerating IV 80 mg lasix  TID dosing today, would continue today and  anticipate change in renal function tomorrow, dropping back to BID. D/w Dr. Fairy, she will add metolazone  x 1 and is already repleting potassium.    Paroxysmal atrial fibrillation Paroxysmal SVT Sinus node dysfunction s/p PPM -has not tolerated beta blocker -was planned for SVT ablation but this was postponed due to recurrent hospitalizations -has intermittent brief tachycardia on monitor. I have asked EP to evaluate her device as frequently these have overlying pacer spikes--need to exclude pacemaker mediate tachycardia -continue current dose of DOAC   CAD -with prior 1V CABG   History of mitral valve disease s/p MVR Mild-moderate aortic  stenosis -reviewed most recent echo, findings do not support valve disease as significant contributor to current symptoms   Chronic hypotension -on midodrine       Signed, Shamirah Ivan A Jamesyn [place], MD  06/09/2024, 8:51 AM

## 2024-06-09 NOTE — Plan of Care (Signed)
  Problem: Clinical Measurements: Goal: Will remain free from infection Outcome: Progressing Goal: Respiratory complications will improve Outcome: Progressing Goal: Cardiovascular complication will be avoided Outcome: Progressing   Problem: Activity: Goal: Risk for activity intolerance will decrease Outcome: Progressing   Problem: Elimination: Goal: Will not experience complications related to bowel motility Outcome: Progressing Goal: Will not experience complications related to urinary retention Outcome: Progressing   Problem: Pain Managment: Goal: General experience of comfort will improve and/or be controlled Outcome: Progressing   Problem: Safety: Goal: Ability to remain free from injury will improve Outcome: Progressing   Problem: Cardiac: Goal: Ability to achieve and maintain adequate cardiopulmonary perfusion will improve Outcome: Progressing

## 2024-06-10 DIAGNOSIS — I5033 Acute on chronic diastolic (congestive) heart failure: Secondary | ICD-10-CM | POA: Diagnosis not present

## 2024-06-10 LAB — BASIC METABOLIC PANEL WITH GFR
Anion gap: 9 (ref 5–15)
BUN: 28 mg/dL — ABNORMAL HIGH (ref 8–23)
CO2: 30 mmol/L (ref 22–32)
Calcium: 8.6 mg/dL — ABNORMAL LOW (ref 8.9–10.3)
Chloride: 103 mmol/L (ref 98–111)
Creatinine, Ser: 1.39 mg/dL — ABNORMAL HIGH (ref 0.44–1.00)
GFR, Estimated: 37 mL/min — ABNORMAL LOW (ref >60)
Glucose, Bld: 93 mg/dL (ref 70–99)
Potassium: 2.8 mmol/L — ABNORMAL LOW (ref 3.5–5.1)
Sodium: 142 mmol/L (ref 135–145)

## 2024-06-10 MED ORDER — POTASSIUM CHLORIDE CRYS ER 20 MEQ PO TBCR
60.0000 meq | EXTENDED_RELEASE_TABLET | Freq: Once | ORAL | Status: AC
  Administered 2024-06-10: 60 meq via ORAL
  Filled 2024-06-10: qty 6

## 2024-06-10 MED ORDER — METOLAZONE 5 MG PO TABS
5.0000 mg | ORAL_TABLET | Freq: Once | ORAL | Status: AC
  Administered 2024-06-10: 5 mg via ORAL
  Filled 2024-06-10: qty 1

## 2024-06-10 MED ORDER — POTASSIUM CHLORIDE CRYS ER 20 MEQ PO TBCR
40.0000 meq | EXTENDED_RELEASE_TABLET | ORAL | Status: DC
  Administered 2024-06-10: 40 meq via ORAL
  Filled 2024-06-10: qty 2
  Filled 2024-06-10: qty 4
  Filled 2024-06-10: qty 2

## 2024-06-10 MED ORDER — POTASSIUM CHLORIDE CRYS ER 20 MEQ PO TBCR: 40.0000 meq | EXTENDED_RELEASE_TABLET | ORAL | Status: DC

## 2024-06-10 MED ORDER — POTASSIUM CHLORIDE CRYS ER 10 MEQ PO TBCR
40.0000 meq | EXTENDED_RELEASE_TABLET | ORAL | Status: AC
  Administered 2024-06-10 (×5): 40 meq via ORAL
  Filled 2024-06-10 (×4): qty 4

## 2024-06-10 NOTE — Plan of Care (Signed)
  Problem: Education: Goal: Knowledge of General Education information will improve Description: Including pain rating scale, medication(s)/side effects and non-pharmacologic comfort measures Outcome: Progressing   Problem: Health Behavior/Discharge Planning: Goal: Ability to manage health-related needs will improve Outcome: Progressing   Problem: Clinical Measurements: Goal: Ability to maintain clinical measurements within normal limits will improve Outcome: Progressing Goal: Will remain free from infection Outcome: Progressing Goal: Diagnostic test results will improve Outcome: Progressing Goal: Respiratory complications will improve Outcome: Progressing Goal: Cardiovascular complication will be avoided Outcome: Progressing   Problem: Activity: Goal: Risk for activity intolerance will decrease Outcome: Progressing   Problem: Nutrition: Goal: Adequate nutrition will be maintained Outcome: Progressing   Problem: Coping: Goal: Level of anxiety will decrease Outcome: Progressing   Problem: Elimination: Goal: Will not experience complications related to bowel motility Outcome: Progressing Goal: Will not experience complications related to urinary retention Outcome: Progressing   Problem: Pain Managment: Goal: General experience of comfort will improve and/or be controlled Outcome: Progressing   Problem: Safety: Goal: Ability to remain free from injury will improve Outcome: Progressing   Problem: Skin Integrity: Goal: Risk for impaired skin integrity will decrease Outcome: Progressing   Problem: Education: Goal: Ability to demonstrate management of disease process will improve Outcome: Progressing Goal: Ability to verbalize understanding of medication therapies will improve Outcome: Progressing   Problem: Activity: Goal: Capacity to carry out activities will improve Outcome: Progressing   Problem: Cardiac: Goal: Ability to achieve and maintain adequate  cardiopulmonary perfusion will improve Outcome: Progressing

## 2024-06-10 NOTE — Progress Notes (Addendum)
 Rounding Note    Patient Name: MARYKATHRYN CARBONI Date of Encounter: 06/10/2024  [facility] Cardiologist: Annabella Scarce, MD   Subjective   Laying in bed. Feels no better. Diuresing well.   Inpatient Medications    Scheduled Meds:  ammonium lactate    Topical BID   apixaban   2.5 mg Oral BID   cholecalciferol   1,000 Units Oral Daily   diazepam   5 mg Oral QHS   empagliflozin   10 mg Oral Daily   estradiol   1 Applicatorful Vaginal Q M,W,F-2000   feeding supplement  237 mL Oral BID BM   furosemide   80 mg Intravenous TID   midodrine   10 mg Oral TID WC   montelukast   5 mg Oral QHS   potassium chloride   40 mEq Oral Q2H while awake   pregabalin   100 mg Oral QHS   Continuous Infusions:   PRN Meds: acetaminophen , albuterol , diazepam , HYDROcodone -acetaminophen , ondansetron  **OR** ondansetron  (ZOFRAN ) IV, sodium chloride    Vital Signs    Vitals:   06/10/24 0011 06/10/24 0456 06/10/24 0457 06/10/24 0715  BP:   (!) 108/51 (!) 122/57  Pulse: 63 65 60 98  Resp:   17 18  Temp:   98.4 F (36.9 C) 98.7 F (37.1 C)  TempSrc:   Oral Oral  SpO2: 98% 98% 98% 97%  Weight:   56.8 kg   Height:        Intake/Output Summary (Last 24 hours) at 06/10/2024 1020 Last data filed at 06/10/2024 0700 Gross per 24 hour  Intake 600 ml  Output 2500 ml  Net -1900 ml      06/10/2024    4:57 AM 06/09/2024    3:20 AM 06/08/2024    3:00 AM  Last 3 Weights  Weight (lbs) 125 lb 3.5 oz 130 lb 8.2 oz 129 lb 8 oz  Weight (kg) 56.8 kg 59.2 kg 58.741 kg      Telemetry    AV pacing- Personally Reviewed  Physical Exam   GEN: No acute distress.  Frail, cachectic Neck: jvd to lower 1/3 neck lying flat Cardiac: RRR no murmurs, rubs, or gallops.  Respiratory: Clear to auscultation bilaterally in upper fields, diminished at bilateral bases GI: Soft, nontender, non-distended  MS: Bilateral LE wrapped. Improving edema Neuro:  Nonfocal  Psych: Normal affect   New pertinent results (labs,  ECG, imaging, cardiac studies)     Assessment & Plan    Acute on chronic diastolic heart failure Third spacing due to nephrotic range proteinuria and hypoalbuminemia Multiple myeloma with AL amyloidosis Chronic kidney disease, was stage 3a, labs here more consistent with stage 3b -presenting with shortness of breath, worsening LE edema, and weight gain despite aggressive outpatient diuretic regimen -nephrotic range proteinuria and AL amyloidosis. ? chemo for her multiple myeloma after discharge, she is eager for Oncology visit. We discussed need for concomitant palliative care engagement. -consulted wound care team for her lower extremities -her renal function is stable, receiving potassium repletion. Her albumin  is very low, which is contributing to her third spacing.  -admission weight 62.2 kg, weight today 56.8 kg. I/O suggest - 4.9 L.  - Agree with palliative care given multiple admissions.  -she wants to pursue aggressive treatment but is tired of the hospital. For now she is willing to continue IV lasix . She has tolerating IV 80 mg lasix  TID dosing today again, she has already received another dose of metolazone  per primary team. IM repleting potassium.  - if Cr worsens tomorrow, consider decrease  lasix  to bid or transition to oral, and stop metolazone .    Paroxysmal atrial fibrillation Paroxysmal SVT Sinus node dysfunction s/p PPM -has not tolerated beta blocker -was planned for SVT ablation but this was postponed due to recurrent hospitalizations -continue current dose of DOAC   CAD -with prior 1V CABG   History of mitral valve disease s/p MVR Mild-moderate aortic stenosis -reviewed most recent echo, findings do not support valve disease as significant contributor to current symptoms   Chronic hypotension -on midodrine       Signed, Soyla DELENA Merck, MD  06/10/2024, 10:20 AM

## 2024-06-10 NOTE — Progress Notes (Signed)
 PROGRESS NOTE    Catherine [name]  FMW:[id number] DOB: Oct 21, 1934 DOA: 06/04/2024 PCP: Micheal Wolm ORN, MD   88/F w chronic atrial fibrillation, hypertension, sp bioprosthetic MVR, CAD CABG, stage IIIa chronic kidney disease, COPD and recently diagnosed multiple myeloma who was sent to the hospital by her cardiologist for worsening bilateral lower extremity swelling, dyspnea. She also complains of a hematoma in her left leg. fourth hospitalization in the last 2 months Abnormal labs include BUN of 31, creatinine of 1.55 compared to baseline of 1.17, proBNP 28,000 847, platelet count 70, hemoglobin 16.6, hematocrit 49.7 Chest x-ray reviewed by me shows bilateral pleural effusions with interval slight decrease in moderate right-sided pleural effusion. Stable small left pleural effusion,   Subjective: - Feels a little better, swelling improving, K level again  Assessment and Plan:   Acute on chronic diastolic CHF (congestive heart failure) (HCC) Last 2D echocardiogram from 07/25 showed an LVEF of 70 to 75% with mild to moderate aortic valve stenosis - Has significant hypoalbuminemia contributing to her edema  - 4.8 L negative, continue higher dose Lasix  3 times daily, Jardiance , midodrine , repeat metolazone  -Continue Unna boots -4th admission in 3months -Poor prognosis, Palliative following, high risk of quick readmission -I worry she needs Hospice, wants to continue aggressive Rx for now  Multiple myeloma (HCC)/Amyloidosis -poor prognosis. Dr.Ennever following, considering Rx  P Atrial fibrillation (HCC) -continue eliquis  - Has had paroxysmal SVT as well, history of intolerance to beta-blockers, was planned to have SVT ablation, postponed with frequent admissions  Chronic kidney disease, stage 3a (HCC) stable  COPD  GOLD 2 Continue PRN bronchodilator therapy and inhaled steroids  Coronary artery disease Stable Continue Eliquis   Severe hypoalbuminemia -Secondary to myeloma,  proteinuria and malnutrition Protein-calorie malnutrition, severe Severe protein calorie malnutrition in the setting of chronic illness Continue Ensure Plus high-protein twice daily Continue MVI and zinc  sulfate   DVT prophylaxis: apixaban  Code Status: DNR Family Communication: none present Disposition Plan: Home likely 1-2days  Consultants:    Procedures:   Antimicrobials:    Objective: Vitals:   06/10/24 0011 06/10/24 0456 06/10/24 0457 06/10/24 0715  BP:   (!) 108/51 (!) 122/57  Pulse: 63 65 60 98  Resp:   17 18  Temp:   98.4 F (36.9 C) 98.7 F (37.1 C)  TempSrc:   Oral Oral  SpO2: 98% 98% 98% 97%  Weight:   56.8 kg   Height:        Intake/Output Summary (Last 24 hours) at 06/10/2024 1055 Last data filed at 06/10/2024 1025 Gross per 24 hour  Intake 600 ml  Output 3100 ml  Net -2500 ml   Filed Weights   06/08/24 0300 06/09/24 0320 06/10/24 0457  Weight: 58.7 kg 59.2 kg 56.8 kg    Examination:  Gen: Awake, Alert, Oriented X 3, chronically ill-appearing HEENT: Positive JVD Lungs: Decreased breath sounds to bases CVS: S1S2/RRR Abd: soft, Non tender, non distended, BS present Extremities: Unna boots, 1-2+ edema extending to upper thighs Skin: as above  Data Reviewed:   CBC: Recent Labs  Lab 06/04/24 1305 06/05/24 0328 06/07/24 0232 06/08/24 0236  WBC 3.2* 5.9 5.9 7.3  NEUTROABS  --   --  3.5 4.6  HGB 16.6* 10.7* 9.8* 11.1*  HCT 49.7* 32.8* 30.5* 33.4*  MCV 92.4 92.9 93.8 91.8  PLT 70* 133* 107* 112*   Basic Metabolic Panel: Recent Labs  Lab 06/06/24 0232 06/07/24 0232 06/08/24 0236 06/09/24 0243 06/10/24 0334  NA 141 140 141 141  142  K 3.3* 3.5 3.5 2.8* 2.8*  CL 106 105 106 102 103  CO2 26 24 27 26 30   GLUCOSE 101* 94 105* 101* 93  BUN 27* 27* 21 29* 28*  CREATININE 1.40* 1.35* 1.35* 1.38* 1.39*  CALCIUM  8.5* 8.4* 8.5* 8.6* 8.6*  MG 2.1  --   --   --   --    GFR: Estimated Creatinine Clearance: 24 mL/min (A) (by C-G formula  based on SCr of 1.39 mg/dL (H)). Liver Function Tests: Recent Labs  Lab 06/04/24 1305 06/07/24 0232 06/08/24 0236  AST 29 24 25   ALT 12 11 13   ALKPHOS 108 77 77  BILITOT 0.3 0.6 0.4  PROT 5.6* 4.6* 4.9*  ALBUMIN  2.7* 1.7* 1.9*   No results for input(s): LIPASE, AMYLASE in the last 168 hours. No results for input(s): AMMONIA in the last 168 hours. Coagulation Profile: No results for input(s): INR, PROTIME in the last 168 hours. Cardiac Enzymes: No results for input(s): CKTOTAL, CKMB, CKMBINDEX, TROPONINI in the last 168 hours. BNP (last 3 results) Recent Labs    04/15/24 1604 05/14/24 0731 06/04/24 1305  PROBNP 14,671.0* 15,910.0* 28,847.0*   HbA1C: No results for input(s): HGBA1C in the last 72 hours. CBG: No results for input(s): GLUCAP in the last 168 hours. Lipid Profile: No results for input(s): CHOL, HDL, LDLCALC, TRIG, CHOLHDL, LDLDIRECT in the last 72 hours. Thyroid  Function Tests: No results for input(s): TSH, T4TOTAL, FREET4, T3FREE, THYROIDAB in the last 72 hours. Anemia Panel: No results for input(s): VITAMINB12, FOLATE, FERRITIN, TIBC, IRON , RETICCTPCT in the last 72 hours. Urine analysis:    Component Value Date/Time   COLORURINE YELLOW 06/04/2024 1505   APPEARANCEUR CLEAR 06/04/2024 1505   APPEARANCEUR Cloudy (A) 07/22/2022 1513   LABSPEC 1.009 06/04/2024 1505   PHURINE 6.0 06/04/2024 1505   GLUCOSEU NEGATIVE 06/04/2024 1505   HGBUR SMALL (A) 06/04/2024 1505   BILIRUBINUR NEGATIVE 06/04/2024 1505   BILIRUBINUR positive 07/11/2023 1453   BILIRUBINUR Negative 07/22/2022 1513   KETONESUR NEGATIVE 06/04/2024 1505   PROTEINUR >300 (A) 06/04/2024 1505   UROBILINOGEN 1.0 07/11/2023 1453   UROBILINOGEN 0.2 03/15/2015 2151   NITRITE NEGATIVE 06/04/2024 1505   LEUKOCYTESUR NEGATIVE 06/04/2024 1505   Sepsis Labs: @LABRCNTIP (procalcitonin:4,lacticidven:4)  )No results found for this or any previous  visit (from the past 240 hours).   Radiology Studies: No results found.    Scheduled Meds:  ammonium lactate    Topical BID   apixaban   2.5 mg Oral BID   cholecalciferol   1,000 Units Oral Daily   diazepam   5 mg Oral QHS   empagliflozin   10 mg Oral Daily   estradiol   1 Applicatorful Vaginal Q M,W,F-2000   feeding supplement  237 mL Oral BID BM   furosemide   80 mg Intravenous TID   midodrine   10 mg Oral TID WC   montelukast   5 mg Oral QHS   potassium chloride   40 mEq Oral Q2H while awake   pregabalin   100 mg Oral QHS   Continuous Infusions:     LOS: 6 days    Time spent:    Sigurd Pac, MD [facility]   06/10/2024, 10:55 AM

## 2024-06-10 NOTE — Plan of Care (Signed)
  Problem: Clinical Measurements: Goal: Will remain free from infection Outcome: Progressing Goal: Respiratory complications will improve Outcome: Progressing Goal: Cardiovascular complication will be avoided Outcome: Progressing   Problem: Activity: Goal: Risk for activity intolerance will decrease Outcome: Progressing   Problem: Coping: Goal: Level of anxiety will decrease Outcome: Progressing   Problem: Elimination: Goal: Will not experience complications related to bowel motility Outcome: Progressing Goal: Will not experience complications related to urinary retention Outcome: Progressing   Problem: Pain Managment: Goal: General experience of comfort will improve and/or be controlled Outcome: Progressing   Problem: Safety: Goal: Ability to remain free from injury will improve Outcome: Progressing   Problem: Cardiac: Goal: Ability to achieve and maintain adequate cardiopulmonary perfusion will improve Outcome: Progressing

## 2024-06-10 NOTE — Progress Notes (Signed)
 Mobility Specialist Progress Note:   06/10/24 1550  Mobility  Activity Ambulated with assistance  Level of Assistance Contact guard assist, steadying assist  Assistive Device Other (Comment) (HHA)  Distance Ambulated (ft) 350 ft  Activity Response Tolerated well  Mobility Referral Yes  Mobility visit 1 Mobility  Mobility Specialist Start Time (ACUTE ONLY) 1550  Mobility Specialist Stop Time (ACUTE ONLY) 1604  Mobility Specialist Time Calculation (min) (ACUTE ONLY) 14 min   Pt agreeable to mobility session. Required HHA for safety throughout ambulation. No overt LOB noted, however pt generally unsteady. SpO2 dropped to 87% with exertion on RA< able to recover with standing rest. Pt back in chair with all needs met, put [place] back on at 2L. RN aware.  Therisa Rana Mobility Specialist Please contact via SecureChat or  Rehab office at [phone]

## 2024-06-11 DIAGNOSIS — Z7189 Other specified counseling: Secondary | ICD-10-CM | POA: Diagnosis not present

## 2024-06-11 DIAGNOSIS — I5033 Acute on chronic diastolic (congestive) heart failure: Secondary | ICD-10-CM | POA: Diagnosis not present

## 2024-06-11 DIAGNOSIS — Z515 Encounter for palliative care: Secondary | ICD-10-CM | POA: Diagnosis not present

## 2024-06-11 LAB — BASIC METABOLIC PANEL WITH GFR
Anion gap: 12 (ref 5–15)
Anion gap: 12 (ref 5–15)
BUN: 33 mg/dL — ABNORMAL HIGH (ref 8–23)
BUN: 34 mg/dL — ABNORMAL HIGH (ref 8–23)
CO2: 27 mmol/L (ref 22–32)
CO2: 27 mmol/L (ref 22–32)
Calcium: 8.8 mg/dL — ABNORMAL LOW (ref 8.9–10.3)
Calcium: 8.9 mg/dL (ref 8.9–10.3)
Chloride: 105 mmol/L (ref 98–111)
Chloride: 104 mmol/L (ref 98–111)
Creatinine, Ser: 1.63 mg/dL — ABNORMAL HIGH (ref 0.44–1.00)
Creatinine, Ser: 1.66 mg/dL — ABNORMAL HIGH (ref 0.44–1.00)
GFR, Estimated: 31 mL/min — ABNORMAL LOW (ref >60)
GFR, Estimated: 30 mL/min — ABNORMAL LOW (ref >60)
Glucose, Bld: 101 mg/dL — ABNORMAL HIGH (ref 70–99)
Glucose, Bld: 151 mg/dL — ABNORMAL HIGH (ref 70–99)
Potassium: 5.2 mmol/L — ABNORMAL HIGH (ref 3.5–5.1)
Potassium: 4.2 mmol/L (ref 3.5–5.1)
Sodium: 144 mmol/L (ref 135–145)
Sodium: 143 mmol/L (ref 135–145)

## 2024-06-11 LAB — MULTIPLE MYELOMA PANEL, SERUM
Albumin SerPl Elph-Mcnc: 2.1 g/dL — ABNORMAL LOW (ref 2.9–4.4)
Albumin/Glob SerPl: 0.8 (ref 0.7–1.7)
Alpha 1: 0.3 g/dL (ref 0.0–0.4)
Alpha2 Glob SerPl Elph-Mcnc: 1 g/dL (ref 0.4–1.0)
B-Globulin SerPl Elph-Mcnc: 1 g/dL (ref 0.7–1.3)
Gamma Glob SerPl Elph-Mcnc: 0.5 g/dL (ref 0.4–1.8)
Globulin, Total: 2.9 g/dL (ref 2.2–3.9)
IgA: 293 mg/dL (ref 64–422)
IgG (Immunoglobin G), Serum: 520 mg/dL — ABNORMAL LOW (ref 586–1602)
IgM (Immunoglobulin M), Srm: 34 mg/dL (ref 26–217)
M Protein SerPl Elph-Mcnc: 0.1 g/dL — ABNORMAL HIGH
Total Protein ELP: 5 g/dL — ABNORMAL LOW (ref 6.0–8.5)

## 2024-06-11 NOTE — Progress Notes (Signed)
 PROGRESS NOTE    Catherine [name]  FMW:[id number] DOB: 10-Feb-1935 DOA: 06/04/2024 PCP: Micheal Wolm ORN, MD   88/F w chronic atrial fibrillation, hypertension, sp bioprosthetic MVR, CAD CABG, stage IIIa chronic kidney disease, COPD and recently diagnosed multiple myeloma who was sent to the hospital by her cardiologist for worsening bilateral lower extremity swelling, dyspnea. She also complains of a hematoma in her left leg. fourth hospitalization in the last 2 months Abnormal labs include BUN of 31, creatinine of 1.55 compared to baseline of 1.17, proBNP 28,000 847, platelet count 70, hemoglobin 16.6, hematocrit 49.7 Chest x-ray reviewed by me shows bilateral pleural effusions with interval slight decrease in moderate right-sided pleural effusion. Stable small left pleural effusion,   Subjective: - Feels a little better, swelling improving, K level again  Assessment and Plan:   Acute on chronic diastolic CHF (congestive heart failure) (HCC) Last 2D echocardiogram from 07/25 showed an LVEF of 70 to 75% with mild to moderate aortic valve stenosis - Has significant hypoalbuminemia contributing to her edema  - 5.9 L negative,continue higher dose Lasix  3 times daily, Jardiance , midodrine , repeat metolazone  -Continue Unna boots -4th admission in 3months -Poor prognosis, Palliative following, high risk of quick readmission -I worry she needs Hospice, wants to continue aggressive Rx for now  Multiple myeloma (HCC)/Amyloidosis -poor prognosis. Dr.Ennever following, considering Rx  P Atrial fibrillation (HCC) -continue eliquis  - Has had paroxysmal SVT as well, history of intolerance to beta-blockers, was planned to have SVT ablation, postponed with frequent admissions  Chronic kidney disease, stage 3a (HCC) stable  COPD  GOLD 2 Continue PRN bronchodilator therapy and inhaled steroids  Coronary artery disease Stable Continue Eliquis   Severe hypoalbuminemia -Secondary to myeloma,  proteinuria and malnutrition Protein-calorie malnutrition, severe Severe protein calorie malnutrition in the setting of chronic illness Continue Ensure Plus high-protein twice daily Continue MVI and zinc  sulfate   DVT prophylaxis: apixaban  Code Status: DNR Family Communication: none present Disposition Plan: Home likely tomorrow  Consultants:    Procedures:   Antimicrobials:    Objective: Vitals:   06/11/24 0019 06/11/24 0020 06/11/24 0416 06/11/24 0916  BP:   (!) 145/55 118/76  Pulse: (!) 59 60 62   Resp:   20 18  Temp:   98.5 F (36.9 C) 98.2 F (36.8 C)  TempSrc:   Oral Oral  SpO2: 97% 97% 99%   Weight:   56.2 kg   Height:        Intake/Output Summary (Last 24 hours) at 06/11/2024 1111 Last data filed at 06/11/2024 0852 Gross per 24 hour  Intake 1280 ml  Output 1700 ml  Net -420 ml   Filed Weights   06/09/24 0320 06/10/24 0457 06/11/24 0416  Weight: 59.2 kg 56.8 kg 56.2 kg    Examination:  Gen: Awake, Alert, Oriented X 3, chronically ill-appearing HEENT: Positive JVD Lungs: Decreased breath sounds to bases CVS: S1S2/RRR Abd: soft, Non tender, non distended, BS present Extremities: Unna boots, 1-2+ edema extending to upper thighs Skin: as above  Data Reviewed:   CBC: Recent Labs  Lab 06/04/24 1305 06/05/24 0328 06/07/24 0232 06/08/24 0236  WBC 3.2* 5.9 5.9 7.3  NEUTROABS  --   --  3.5 4.6  HGB 16.6* 10.7* 9.8* 11.1*  HCT 49.7* 32.8* 30.5* 33.4*  MCV 92.4 92.9 93.8 91.8  PLT 70* 133* 107* 112*   Basic Metabolic Panel: Recent Labs  Lab 06/06/24 0232 06/07/24 0232 06/08/24 0236 06/09/24 0243 06/10/24 0334 06/11/24 0243  NA 141 140 141  141 142 144  K 3.3* 3.5 3.5 2.8* 2.8* 5.2*  CL 106 105 106 102 103 105  CO2 26 24 27 26 30 27   GLUCOSE 101* 94 105* 101* 93 101*  BUN 27* 27* 21 29* 28* 33*  CREATININE 1.40* 1.35* 1.35* 1.38* 1.39* 1.63*  CALCIUM  8.5* 8.4* 8.5* 8.6* 8.6* 8.8*  MG 2.1  --   --   --   --   --    GFR: Estimated  Creatinine Clearance: 20.5 mL/min (A) (by C-G formula based on SCr of 1.63 mg/dL (H)). Liver Function Tests: Recent Labs  Lab 06/04/24 1305 06/07/24 0232 06/08/24 0236  AST 29 24 25   ALT 12 11 13   ALKPHOS 108 77 77  BILITOT 0.3 0.6 0.4  PROT 5.6* 4.6* 4.9*  ALBUMIN  2.7* 1.7* 1.9*   No results for input(s): LIPASE, AMYLASE in the last 168 hours. No results for input(s): AMMONIA in the last 168 hours. Coagulation Profile: No results for input(s): INR, PROTIME in the last 168 hours. Cardiac Enzymes: No results for input(s): CKTOTAL, CKMB, CKMBINDEX, TROPONINI in the last 168 hours. BNP (last 3 results) Recent Labs    04/15/24 1604 05/14/24 0731 06/04/24 1305  PROBNP 14,671.0* 15,910.0* 28,847.0*   HbA1C: No results for input(s): HGBA1C in the last 72 hours. CBG: No results for input(s): GLUCAP in the last 168 hours. Lipid Profile: No results for input(s): CHOL, HDL, LDLCALC, TRIG, CHOLHDL, LDLDIRECT in the last 72 hours. Thyroid  Function Tests: No results for input(s): TSH, T4TOTAL, FREET4, T3FREE, THYROIDAB in the last 72 hours. Anemia Panel: No results for input(s): VITAMINB12, FOLATE, FERRITIN, TIBC, IRON , RETICCTPCT in the last 72 hours. Urine analysis:    Component Value Date/Time   COLORURINE YELLOW 06/04/2024 1505   APPEARANCEUR CLEAR 06/04/2024 1505   APPEARANCEUR Cloudy (A) 07/22/2022 1513   LABSPEC 1.009 06/04/2024 1505   PHURINE 6.0 06/04/2024 1505   GLUCOSEU NEGATIVE 06/04/2024 1505   HGBUR SMALL (A) 06/04/2024 1505   BILIRUBINUR NEGATIVE 06/04/2024 1505   BILIRUBINUR positive 07/11/2023 1453   BILIRUBINUR Negative 07/22/2022 1513   KETONESUR NEGATIVE 06/04/2024 1505   PROTEINUR >300 (A) 06/04/2024 1505   UROBILINOGEN 1.0 07/11/2023 1453   UROBILINOGEN 0.2 03/15/2015 2151   NITRITE NEGATIVE 06/04/2024 1505   LEUKOCYTESUR NEGATIVE 06/04/2024 1505   Sepsis  Labs: @LABRCNTIP (procalcitonin:4,lacticidven:4)  )No results found for this or any previous visit (from the past 240 hours).   Radiology Studies: No results found.    Scheduled Meds:  ammonium lactate    Topical BID   apixaban   2.5 mg Oral BID   cholecalciferol   1,000 Units Oral Daily   diazepam   5 mg Oral QHS   empagliflozin   10 mg Oral Daily   estradiol   1 Applicatorful Vaginal Q M,W,F-2000   feeding supplement  237 mL Oral BID BM   furosemide   80 mg Intravenous TID   midodrine   10 mg Oral TID WC   montelukast   5 mg Oral QHS   pregabalin   100 mg Oral QHS   Continuous Infusions:     LOS: 7 days    Time spent:    Sigurd Pac, MD [facility]   06/11/2024, 11:11 AM

## 2024-06-11 NOTE — Plan of Care (Signed)
   Problem: Activity: Goal: Risk for activity intolerance will decrease Outcome: Progressing   Problem: Coping: Goal: Level of anxiety will decrease Outcome: Progressing   Problem: Elimination: Goal: Will not experience complications related to urinary retention Outcome: Progressing

## 2024-06-11 NOTE — Plan of Care (Signed)
  Problem: Clinical Measurements: Goal: Will remain free from infection Outcome: Progressing Goal: Respiratory complications will improve Outcome: Progressing Goal: Cardiovascular complication will be avoided Outcome: Progressing   Problem: Activity: Goal: Risk for activity intolerance will decrease Outcome: Progressing   Problem: Elimination: Goal: Will not experience complications related to bowel motility Outcome: Progressing Goal: Will not experience complications related to urinary retention Outcome: Progressing   Problem: Safety: Goal: Ability to remain free from injury will improve Outcome: Progressing

## 2024-06-11 NOTE — Progress Notes (Signed)
 Progress Note  Patient Name: Catherine [name] Date of Encounter: 06/11/2024 Primary Cardiologist: Annabella Scarce, MD   Subjective   Overnight no new events. Patient notes that she is eager to return how; her husband is about to turn 11 and he is a bit more forgetful with her not there. No CP, SOB, Palpitations.  Still have a cough.  Has a slight tremor in her hand on her current therapy.  Vital Signs    Vitals:   06/11/24 0018 06/11/24 0019 06/11/24 0020 06/11/24 0416  BP:    (!) 145/55  Pulse: (!) 59 (!) 59 60 62  Resp:    20  Temp:    98.5 F (36.9 C)  TempSrc:    Oral  SpO2: 97% 97% 97% 99%  Weight:    56.2 kg  Height:        Intake/Output Summary (Last 24 hours) at 06/11/2024 0804 Last data filed at 06/11/2024 9378 Gross per 24 hour  Intake 1280 ml  Output 2300 ml  Net -1020 ml   Filed Weights   06/09/24 0320 06/10/24 0457 06/11/24 0416  Weight: 59.2 kg 56.8 kg 56.2 kg    Physical Exam   GEN: No acute distress.   Neck: + JVD Cardiac: RRR, no murmurs, rubs, or gallops.  Respiratory: Clear to auscultation bilaterally. GI: Soft, nontender, non-distended  MS: +3 edema  Labs   Telemetry: AP/PV with intermittent controlled AD   Chemistry Recent Labs  Lab 06/04/24 1305 06/05/24 0328 06/07/24 0232 06/08/24 0236 06/09/24 0243 06/10/24 0334 06/11/24 0243  NA 138   < > 140 141 141 142 144  K 3.7   < > 3.5 3.5 2.8* 2.8* 5.2*  CL 103   < > 105 106 102 103 105  CO2 24   < > 24 27 26 30 27   GLUCOSE 106*   < > 94 105* 101* 93 101*  BUN 31*   < > 27* 21 29* 28* 33*  CREATININE 1.55*   < > 1.35* 1.35* 1.38* 1.39* 1.63*  CALCIUM  9.5   < > 8.4* 8.5* 8.6* 8.6* 8.8*  PROT 5.6*  --  4.6* 4.9*  --   --   --   ALBUMIN  2.7*  --  1.7* 1.9*  --   --   --   AST 29  --  24 25  --   --   --   ALT 12  --  11 13  --   --   --   ALKPHOS 108  --  77 77  --   --   --   BILITOT 0.3  --  0.6 0.4  --   --   --   GFRNONAA 32*   < > 38* 38* 37* 37* 31*  ANIONGAP 12   < > 11 8  13 9 12    < > = values in this interval not displayed.     Hematology Recent Labs  Lab 06/05/24 0328 06/07/24 0232 06/08/24 0236  WBC 5.9 5.9 7.3  RBC 3.53* 3.25* 3.64*  HGB 10.7* 9.8* 11.1*  HCT 32.8* 30.5* 33.4*  MCV 92.9 93.8 91.8  MCH 30.3 30.2 30.5  MCHC 32.6 32.1 33.2  RDW 13.8 13.9 13.7  PLT 133* 107* 112*   BNP Recent Labs  Lab 06/04/24 1305  PROBNP 28,847.0*     Cardiac Studies   Cardiac Studies & Procedures   ______________________________________________________________________________________________ CARDIAC CATHETERIZATION  CARDIAC CATHETERIZATION 05/17/2024  Conclusion Right heart catheterization  via the right brachial vein showed mildly elevated filling pressures, moderate pulmonary hypertension and normal cardiac output.  RA: 8 mmHg, RV 51/2 mmHg, PW: 18 mmHg, PA: 50/10 with a mean of 28 mmHg, cardiac output is 4.64 with an index of 2.92.  Recommendations: She can likely be switched to an oral diuretic tomorrow.     ECHOCARDIOGRAM  ECHOCARDIOGRAM COMPLETE 04/24/2024  Narrative ECHOCARDIOGRAM REPORT    Patient Name:   Catherine [name] Date of Exam: 04/24/2024 Medical Rec #:  [id number]      Height:       63.0 in Accession #:    [id number]     Weight:       115.0 lb Date of Birth:  29-Jul-1935     BSA:          1.528 m Patient Age:    88 years       BP:           139/64 mmHg Patient Gender: F              HR:           61 bpm. Exam Location:  Inpatient  Procedure: 2D Echo, Cardiac Doppler and Color Doppler (Both Spectral and Color Flow Doppler were utilized during procedure).  Indications:    CHF-Acute Diastolic I50.31  History:        Patient has prior history of Echocardiogram examinations, most recent 11/02/2023. CHF, CAD, Prior CABG, COPD and CKD, stage 3, Arrythmias:Atrial Fibrillation, Signs/Symptoms:Dyspnea; Risk Factors:Hypertension and Dyslipidemia.  Mitral Valve: 29 mm Medtronic bioprosthetic valve valve is present in the mitral  position. Procedure Date: 10/14/2016.  Sonographer:    Thea Norlander RCS Referring Phys: [id number] PAULA V ROSS  IMPRESSIONS   1. No resting LVOT gradient. Left ventricular ejection fraction, by estimation, is 70 to 75%. The left ventricle has hyperdynamic function. The left ventricle has no regional wall motion abnormalities. Left ventricular diastolic parameters are indeterminate. 2. Right ventricular systolic function is mildly reduced. The right ventricular size is normal. There is moderately elevated pulmonary artery systolic pressure. The estimated right ventricular systolic pressure is 50.5 mmHg. 3. Left atrial size was severely dilated. 4. Right atrial size was mildly dilated. 5. Small, echodensity noted on the chordal apparatus unchanged from 2023 PLAX view. There new LVOT is small related to valve struts with LVOT flow acceleration. The mitral valve has been repaired/replaced. No evidence of mitral valve regurgitation. The mean mitral valve gradient is 8.0 mmHg. There is a 29 mm Medtronic bioprosthetic valve present in the mitral position. Procedure Date: 10/14/2016. 6. Tricuspid valve regurgitation is moderate. 7. The aortic valve is tricuspid. Aortic valve regurgitation is not visualized. Mild to moderate aortic valve stenosis. Aortic valve area, by VTI measures 1.04 cm. Aortic valve mean gradient measures 19.0 mmHg. Aortic valve Vmax measures 3.02 m/s. 8. The inferior vena cava is dilated in size with <50% respiratory variability, suggesting right atrial pressure of 15 mmHg.  Comparison(s): Prior images reviewed side by side. Mitral gradients have increased from 2023 with similar heart rates (60s); aortic valve gradients have increased; a portion of this is related to dynamic LV function.  FINDINGS Left Ventricle: No resting LVOT gradient. Left ventricular ejection fraction, by estimation, is 70 to 75%. The left ventricle has hyperdynamic function. The left ventricle has no regional  wall motion abnormalities. The left ventricular internal cavity size was normal in size. There is no left ventricular hypertrophy. Left ventricular diastolic parameters are indeterminate.  Right  Ventricle: The right ventricular size is normal. No increase in right ventricular wall thickness. Right ventricular systolic function is mildly reduced. There is moderately elevated pulmonary artery systolic pressure. The tricuspid regurgitant velocity is 2.98 m/s, and with an assumed right atrial pressure of 15 mmHg, the estimated right ventricular systolic pressure is 50.5 mmHg.  Left Atrium: Left atrial size was severely dilated.  Right Atrium: Right atrial size was mildly dilated.  Pericardium: There is no evidence of pericardial effusion.  Mitral Valve: Small, echodensity noted on the chordal apparatus unchanged from 2023 PLAX view. There new LVOT is small related to valve struts with LVOT flow acceleration. The mitral valve has been repaired/replaced. No evidence of mitral valve regurgitation. There is a 29 mm Medtronic bioprosthetic valve present in the mitral position. Procedure Date: 10/14/2016. MV peak gradient, 23.7 mmHg. The mean mitral valve gradient is 8.0 mmHg with average heart rate of 60 bpm.  Tricuspid Valve: The tricuspid valve is normal in structure. Tricuspid valve regurgitation is moderate . No evidence of tricuspid stenosis.  Aortic Valve: The aortic valve is tricuspid. Aortic valve regurgitation is not visualized. Mild to moderate aortic stenosis is present. Aortic valve mean gradient measures 19.0 mmHg. Aortic valve peak gradient measures 36.5 mmHg. Aortic valve area, by VTI measures 1.04 cm.  Pulmonic Valve: The pulmonic valve was normal in structure. Pulmonic valve regurgitation is trivial. No evidence of pulmonic stenosis.  Aorta: The aortic root and ascending aorta are structurally normal, with no evidence of dilitation.  Venous: The inferior vena cava is dilated in size  with less than 50% respiratory variability, suggesting right atrial pressure of 15 mmHg.  IAS/Shunts: The atrial septum is grossly normal.   LEFT VENTRICLE PLAX 2D LVIDd:         4.57 cm   Diastology LVIDs:         3.08 cm   LV e' medial:    6.16 cm/s LV PW:         0.64 cm   LV E/e' medial:  32.3 LV IVS:        0.93 cm   LV e' lateral:   8.92 cm/s LVOT diam:     1.60 cm   LV E/e' lateral: 22.3 LV SV:         63 LV SV Index:   41 LVOT Area:     2.01 cm   RIGHT VENTRICLE            IVC RV S prime:     7.22 cm/s  IVC diam: 2.27 cm TAPSE (M-mode): 1.6 cm  LEFT ATRIUM              Index        RIGHT ATRIUM           Index LA diam:        5.80 cm  3.79 cm/m   RA Area:     21.15 cm LA Vol (A2C):   97.8 ml  63.99 ml/m  RA Volume:   59.55 ml  38.96 ml/m LA Vol (A4C):   119.0 ml 77.86 ml/m LA Biplane Vol: 108.0 ml 70.66 ml/m AORTIC VALVE AV Area (Vmax):    1.09 cm AV Area (Vmean):   1.05 cm AV Area (VTI):     1.04 cm AV Vmax:           302.00 cm/s AV Vmean:          198.000 cm/s AV VTI:  0.604 m AV Peak Grad:      36.5 mmHg AV Mean Grad:      19.0 mmHg LVOT Vmax:         164.00 cm/s LVOT Vmean:        103.000 cm/s LVOT VTI:          0.313 m LVOT/AV VTI ratio: 0.52  AORTA Ao Root diam: 3.15 cm Ao Asc diam:  3.50 cm  MITRAL VALVE                TRICUSPID VALVE MV Area (PHT): 2.34 cm     TR Peak grad:   35.5 mmHg MV Area VTI:   0.94 cm     TR Vmax:        298.00 cm/s MV Peak grad:  23.7 mmHg MV Mean grad:  8.0 mmHg     SHUNTS MV Vmax:       2.44 m/s     Systemic VTI:  0.31 m MV Vmean:      110.0 cm/s   Systemic Diam: 1.60 cm MV Decel Time: 324 msec MV E velocity: 199.00 cm/s  Stanly Leavens MD Electronically signed by Stanly Leavens MD Signature Date/Time: 04/24/2024/5:19:23 PM    Final    MONITORS  LONG TERM MONITOR-LIVE TELEMETRY (3-14 DAYS) 10/10/2023  Narrative 7 Day Zio Monitor  Quality: Fair.  Baseline  artifact. Predominant rhythm: Sinus arrhythmia with long first degree AV block, junctional rhythm Average heart rate: 59 bpm Max heart rate: 179 bpm Min heart rate: 27 bpm Pauses >2.5 seconds: Sinus arrest lasting up to 6.9 seconds  469 episodes of SVT lasting up to 1 hour 32 seconds Rare (<1%) PVCs and PACs.   Tiffany C. 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 - Pending Heme eval; I do worry about long term chemotherapy in the setting of baseline anemia and thromboctyopenia - - PC consultation previously recommend; she is waiting for them to come - she is planned for one more day of lasix  80 mg IV TID - tomorrow switch to bumex  2 mg PO daily - continue SGLT2i  PAF CHADVASC NA P-SVT - SSS s/p PPM - no plans for AV nodal agents, continue DOAC, I do not predict future SVT ablation  CAD - stable s/p 1V CABG  Mild/Mod AS MR s/p MVR - no change in therapy; gradients related to dynamic function, unable to beta blockade  Chronic hypotension - on midodrine   GOC - discussed that she has an adopted daugther and wonderful SIL they have three children and they are expecting a great grandchild a little after Thanksgiving - her goal is to give chemotherapy a shot, and wants to live at least to see that great grandchild to be born - I think this is a reasonable attempt; If Dr. Timmy needs us  to expedite CMR or AL-CA testing (if it would change chemotherapy plans) we will do this - long term while she would meet criteria for hospice, to get to her goal of Thanksgiving, chemotherapy, high dose diuretics and potential future PleurX catheter would be needed  Planned DC tomorrow unless to issues occur  For questions or updates, please contact [facility] Please consult [email or web address]  for contact info under Cardiology/STEMI.      Stanly Leavens, MD FASE [facility] Cardiologist [place]  [facility]  [address] [place], [place] [address] [phone]  8:04 AM

## 2024-06-11 NOTE — Progress Notes (Signed)
 Physical Therapy Treatment Patient Details Name: Catherine [name] MRN: [id number] DOB: [birth year] Today's Date: 06/11/2024   History of Present Illness Pt is 89 yo presenting to [facility] on 9/8 due to worsening bil LE swelling and dyspnea. Admitted for CHF exacerbation. Pt admitted 8/18-8/28 for LE swelling and CHF exacerbation, found to have multiple myeloma. PMH includes: Afib on Eliquis , PPM, COPD, MVR, CAD s/p CABG, HTN, HLD, HFpEF, chronic back pain with past surgeries, CKD    PT Comments  Pt agreeable to session and able to progress ambulation distance this session with less O2. Pt able to complete 100 ft of hallway ambulation followed by standing rest and additional 159ft back to her room on RA with SpO2 maintained 93-95% after 1st 100 ft and to low of 88% after 2nd bout. Pt then placed back on 2L O2 and quickly returned to 99%. Rn present and turned to 1L at rest with SpO2 still >90%. Will continue to follow and progress dynamic stability and activity endurance, recommendations remain appropriate.    If plan is discharge home, recommend the following: A little help with walking and/or transfers;Assist for transportation;Assistance with cooking/housework   Can travel by private vehicle        Equipment Recommendations  None recommended by PT    Recommendations for Other Services       Precautions / Restrictions Precautions Precautions: Fall Recall of Precautions/Restrictions: Intact Precaution/Restrictions Comments: watch SpO2, on 1L in session Restrictions Weight Bearing Restrictions Per Provider Order: No     Mobility  Bed Mobility               General bed mobility comments: pt in recliner at start and end of session    Transfers Overall transfer level: Needs assistance Equipment used: None Transfers: Sit to/from Stand Sit to Stand: Supervision           General transfer comment: CGA in session, pt standing from [facility] with supervision to modI     Ambulation/Gait Ambulation/Gait assistance: Contact guard assist Gait Distance (Feet): 200 Feet Assistive device: None (intermittent use of UE on rails in hall, sink, wall) Gait Pattern/deviations: Step-through pattern, Decreased stride length Gait velocity: decreased Gait velocity interpretation: <1.31 ft/sec, indicative of household ambulator   General Gait Details: mild lateral drifting but no overt LOB. single standing rest break after ~100 ft. SpO2 93-95% at that point, 88-90% after 154ft to return to room with pt SOB, returned to 99% after 3 min seated rest on 2L   Stairs             Wheelchair Mobility     Tilt Bed    Modified Rankin (Stroke Patients Only)       Balance Overall balance assessment: Needs assistance Sitting-balance support: No upper extremity supported, Feet supported Sitting balance-Leahy Scale: Good     Standing balance support: No upper extremity supported Standing balance-Leahy Scale: Fair Standing balance comment: No LOB or sway statically, mild sway with gait                            Communication Communication Communication: No apparent difficulties  Cognition Arousal: Alert Behavior During Therapy: WFL for tasks assessed/performed   PT - Cognitive impairments: No apparent impairments                         Following commands: Intact      Cueing Cueing Techniques: Verbal cues  Exercises      General Comments General comments (skin integrity, edema, etc.): SpO2 99% on 2L at rest, 97% on RA at rest, 93-95% after 161ft ambulation, then low of 88% after additional 100 ft needing 2L to recover      Pertinent Vitals/Pain Pain Assessment Pain Assessment: No/denies pain     PT Goals (current goals can now be found in the care plan section) Acute Rehab PT Goals Patient Stated Goal: to return home and do HHPT PT Goal Formulation: With patient Time For Goal Achievement: 06/21/24 Potential to Achieve  Goals: Good Progress towards PT goals: Progressing toward goals    Frequency    Min 2X/week       AM-PAC PT 6 Clicks Mobility   Outcome Measure  Help needed turning from your back to your side while in a flat bed without using bedrails?: A Little Help needed moving from lying on your back to sitting on the side of a flat bed without using bedrails?: A Little Help needed moving to and from a bed to a chair (including a wheelchair)?: A Little Help needed standing up from a chair using your arms (e.g., wheelchair or bedside chair)?: A Little Help needed to walk in hospital room?: A Little Help needed climbing 3-5 steps with a railing? : A Little 6 Click Score: 18    End of Session Equipment Utilized During Treatment: Gait belt;Oxygen  Activity Tolerance: Patient tolerated treatment well Patient left: in chair;with call bell/phone within reach;with chair alarm set Nurse Communication: Mobility status PT Visit Diagnosis: Other abnormalities of gait and mobility (R26.89);Unsteadiness on feet (R26.81);Muscle weakness (generalized) (M62.81)     Time: 8557-8494 PT Time Calculation (min) (ACUTE ONLY): 23 min  Charges:    $Gait Training: 8-22 mins $Therapeutic Exercise: 8-22 mins PT General Charges $$ ACUTE PT VISIT: 1 Visit                     Izetta Call, PT, DPT   Acute Rehabilitation Department Office [phone] Secure Chat Communication Preferred   Izetta JULIANNA Call 06/11/2024, 3:49 PM

## 2024-06-11 NOTE — Progress Notes (Signed)
   Palliative Medicine Inpatient Follow Up Note HPI: 88/F w chronic atrial fibrillation, hypertension, sp bioprosthetic MVR, CAD CABG, stage IIIa chronic kidney disease, COPD and recently diagnosed multiple myeloma who was sent to the hospital by her cardiologist for worsening bilateral lower extremity swelling, dyspnea.   Today's Discussion 06/11/2024  *Please note that this is a verbal dictation therefore any spelling or grammatical errors are due to the Dragon Medical One system interpretation.  Chart reviewed inclusive of vital signs, progress notes, laboratory results, and diagnostic images.   I met with Catherine [name] this afternoon. She is awake and alert. She shares that she has been sitting in the recliner for sometime now. She shares she has no pain or nausea. She does have a cough and is brining up sputum from what she states.   Created space and opportunity for patient to explore thoughts feelings and fears regarding current medical situation. She has a fairly good understanding of her present heart failure. She understands the possible outcomes in relation to her MM and amyloidosis.   We reviewed the idea of OP Palliative support through [facility]. Patient is in agreement with this plan.  I spoke with patients daughter, Catherine [name] this afternoon. We reviewed the above. She too shares agreement with Op Palliative support.   Questions and concerns addressed/Palliative Support Provided.   Objective Assessment: Vital Signs Vitals:   06/11/24 0916 06/11/24 1129  BP: 118/76 (!) 135/52  Pulse:    Resp: 18 20  Temp: 98.2 F (36.8 C) 98.2 F (36.8 C)  SpO2:      Intake/Output Summary (Last 24 hours) at 06/11/2024 1331 Last data filed at 06/11/2024 9147 Gross per 24 hour  Intake 800 ml  Output 1700 ml  Net -900 ml   Last Weight  Most recent update: 06/11/2024  4:17 AM    Weight  56.2 kg (123 lb 14.4 oz)            Gen:  Elderly Caucasian F chronically ill in appearance HEENT: moist  mucous membranes CV: Regular rate and rhythm  PULM:  On 2LPM [place], breathing is even and nonlabored ABD: soft/nontender  EXT: BLE edema  Neuro: Alert and oriented x3   SUMMARY OF RECOMMENDATIONS   DNAR/DNI  ADs in [facility]  OP Palliative support through [facility]  Continue to allow time for outcomes  The PMT will continue to follow while hospitalized ______________________________________________________________________________________ Catherine [name] [facility] [place] Palliative Medicine Team Team Cell Phone: [phone] Please utilize secure chat with additional questions, if there is no response within 30 minutes please call the above phone number  Time Spent: 10  Palliative Medicine Team providers are available by phone from 7am to 7pm daily and can be reached through the team cell phone.  Should this patient require assistance outside of these hours, please call the patient's attending physician.

## 2024-06-11 NOTE — Progress Notes (Signed)
 Mobility Specialist Progress Note:    06/11/24 1720  Mobility  Activity Respositioned in chair (Ankle Pumps, Leg Ext, Leg Curls)  Level of Assistance Standby assist, set-up cues, supervision of patient - no hands on  Assistive Device None  Range of Motion/Exercises Left leg;Right leg  Activity Response Tolerated well  Mobility Referral Yes  Mobility visit 1 Mobility  Mobility Specialist Start Time (ACUTE ONLY) 1720  Mobility Specialist Stop Time (ACUTE ONLY) 1727  Mobility Specialist Time Calculation (min) (ACUTE ONLY) 7 min   Received pt sitting in recliner waiting for legs to be rewrapped wanting to do some exercise based things since she couldn't walk. Feeling fatigued but otherwise no c/o any symptoms. Pt able to performed exercises w/ no issues. Left pt in recliner w/ all needs met.   Venetia Keel Mobility Specialist Please Neurosurgeon or Rehab Office at [phone]

## 2024-06-11 NOTE — TOC Progression Note (Signed)
 Transition of Care [facility]) - Progression Note    Patient Details  Name: PRESLEE REGAS MRN: [id number] Date of Birth: 19-Mar-1935  Transition of [facility]) CM/SW Contact  Waddell Barnie Rama, RN Phone Number: 06/11/2024, 12:07 PM  Clinical Narrative:    NCM notified by Palliative NP Rosaline that daughter wants [facility] for outpatient palliative services.  NCM spoke with patient to confirm she states yes,  NCM called to make referral to [facility] at [facility] for outpatient palliative services.    Expected Discharge Plan: Home w Home Health Services Barriers to Discharge: Continued Medical Work up               Expected Discharge Plan and Services In-house Referral: NA Discharge Planning Services: CM Consult Post Acute Care Choice: Resumption of Svcs/PTA Provider, Home Health Living arrangements for the past 2 months: Single Family Home                 DME Arranged: N/A DME Agency: NA       HH Arranged: PT, OT HH Agency: [facility] Date HH Agency Contacted: 06/05/24 Time HH Agency Contacted: 1505 Representative spoke with at [facility] Agency: Amy   Social Drivers of Health (SDOH) Interventions SDOH Screenings   Food Insecurity: No Food Insecurity (06/04/2024)  Housing: Low Risk  (06/04/2024)  Transportation Needs: No Transportation Needs (06/04/2024)  Utilities: Not At Risk (06/04/2024)  Alcohol Screen: Low Risk  (11/06/2021)  Depression (PHQ2-9): Low Risk  (05/25/2024)  Recent Concern: Depression (PHQ2-9) - Medium Risk (04/26/2024)  Financial Resource Strain: Low Risk  (04/09/2022)  Physical Activity: Insufficiently Active (04/09/2022)  Social Connections: Socially Integrated (06/04/2024)  Stress: No Stress Concern Present (04/09/2022)  Tobacco Use: Medium Risk (06/04/2024)    Readmission Risk Interventions    06/05/2024    2:59 PM 05/22/2024    3:49 PM 04/24/2024   12:04 PM  Readmission Risk Prevention Plan  Transportation Screening Complete Complete Complete  PCP or  Specialist Appt within 3-5 Days   Complete  HRI or Home Care Consult   Complete  Social Work Consult for Recovery Care Planning/Counseling   Complete  Palliative Care Screening   Not Applicable  Medication Review Oceanographer) Complete Referral to Pharmacy Referral to Pharmacy  PCP or Specialist appointment within 3-5 days of discharge Complete    HRI or Home Care Consult Complete Complete   SW Recovery Care/Counseling Consult  Complete   Palliative Care Screening -- Not Applicable   Skilled Nursing Facility Not Applicable Not Applicable

## 2024-06-12 ENCOUNTER — Encounter: Payer: Self-pay | Admitting: Hematology & Oncology

## 2024-06-12 DIAGNOSIS — I5033 Acute on chronic diastolic (congestive) heart failure: Secondary | ICD-10-CM | POA: Diagnosis not present

## 2024-06-12 LAB — BASIC METABOLIC PANEL WITH GFR
Anion gap: 12 (ref 5–15)
BUN: 36 mg/dL — ABNORMAL HIGH (ref 8–23)
CO2: 31 mmol/L (ref 22–32)
Calcium: 9 mg/dL (ref 8.9–10.3)
Chloride: 98 mmol/L (ref 98–111)
Creatinine, Ser: 1.63 mg/dL — ABNORMAL HIGH (ref 0.44–1.00)
GFR, Estimated: 31 mL/min — ABNORMAL LOW (ref >60)
Glucose, Bld: 100 mg/dL — ABNORMAL HIGH (ref 70–99)
Potassium: 3.2 mmol/L — ABNORMAL LOW (ref 3.5–5.1)
Sodium: 141 mmol/L (ref 135–145)

## 2024-06-12 MED ORDER — METOLAZONE 5 MG PO TABS: ORAL_TABLET | ORAL | 0 refills | Status: DC

## 2024-06-12 MED ORDER — POTASSIUM CHLORIDE CRYS ER 20 MEQ PO TBCR
40.0000 meq | EXTENDED_RELEASE_TABLET | Freq: Two times a day (BID) | ORAL | Status: DC
  Administered 2024-06-12: 40 meq via ORAL
  Filled 2024-06-12: qty 2

## 2024-06-12 MED ORDER — BUMETANIDE 2 MG PO TABS: 2.0000 mg | ORAL_TABLET | Freq: Every day | ORAL | Status: DC

## 2024-06-12 MED ORDER — POTASSIUM CHLORIDE CRYS ER 20 MEQ PO TBCR: EXTENDED_RELEASE_TABLET | ORAL | 0 refills | Status: DC

## 2024-06-12 NOTE — Discharge Summary (Signed)
 Physician Discharge Summary  Catherine [name] FMW:[id number] DOB: 1935/09/10 DOA: 06/04/2024  PCP: Micheal Wolm ORN, MD  Admit date: 06/04/2024 Discharge date: 06/12/2024  Time spent: 45 minutes  Recommendations for Outpatient Follow-up:  [facility] heart care on 9/22 Outpatient palliative care Oncology Dr. 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 High risk of quick readmission   Discharge Diagnoses:  Principal Problem:   Acute on chronic diastolic CHF (congestive heart failure) (HCC)   Atrial fibrillation (HCC)   Chronic kidney disease, stage 3a (HCC)   Coronary artery disease   COPD  GOLD 2   Protein-calorie malnutrition, severe   Multiple myeloma (HCC)   Bilateral lower extremity edema   AL amyloid nephropathy (HCC)   Hypoalbuminemia DNR   Discharge Condition: Fair  Diet recommendation: Sodium, heart healthy  Filed Weights   06/10/24 0457 06/11/24 0416 06/12/24 0434  Weight: 56.8 kg 56.2 kg 56.7 kg    History of present illness:  88/F w chronic atrial fibrillation, hypertension, sp bioprosthetic MVR, CAD CABG, stage IIIa chronic kidney disease, COPD and recently diagnosed multiple myeloma who was sent to the hospital by her cardiologist for worsening bilateral lower extremity swelling, dyspnea. She also complains of a hematoma in her left leg. fourth hospitalization in the last 2 months Abnormal labs include BUN of 31, creatinine of 1.55 compared to baseline of 1.17, proBNP 28,000 847, platelet count 70, hemoglobin 16.6, hematocrit 49.7 Chest x-ray reviewed by me shows bilateral pleural effusions with interval slight decrease in moderate right-sided pleural effusion. Stable small left pleural effusion,   Hospital Course:   Acute on chronic diastolic CHF (congestive heart failure) (HCC) Last 2D echocardiogram from 07/25 showed an LVEF of 70 to 75% with mild to moderate aortic valve stenosis - Has significant  hypoalbuminemia contributing to her edema  - Slowly improving with diuresis, 6.4 L negative, cardiology following, still has some third spacing and edema but overall improved, transition to oral Bumex , add metolazone  twice a week -GDMT limited by hypotension, continue midodrine  -4th admission in 3months -Poor prognosis, Palliative following, high risk of quick readmission -I worry she needs Hospice, wants to continue aggressive Rx for now -Follow-up with [facility] on 9/22   Multiple myeloma (HCC)/Amyloidosis -poor prognosis. Dr.Ennever following, considering Rx   P Atrial fibrillation (HCC) -continue eliquis  - Has had paroxysmal SVT as well, history of intolerance to beta-blockers, was planned to have SVT ablation, postponed with frequent admissions   Chronic kidney disease, stage 3a (HCC) stable   COPD  GOLD 2 Continue PRN bronchodilator therapy and inhaled steroids   Coronary artery disease Stable Continue Eliquis    Severe hypoalbuminemia -Secondary to myeloma, proteinuria and malnutrition Protein-calorie malnutrition, severe Severe protein calorie malnutrition in the setting of chronic illness Continue Ensure Plus high-protein twice daily Continue MVI and zinc  sulfate    Discharge Exam: Vitals:   06/12/24 0718 06/12/24 1058  BP: (!) 114/54 (!) 119/53  Pulse: 60 84  Resp: 17 18  Temp: (!) 97.4 F (36.3 C) 98.8 F (37.1 C)  SpO2: 100% 98%    Gen: Awake, Alert, Oriented X 3, chronically ill-appearing HEENT: Positive JVD Lungs: Decreased breath sounds to bases CVS: S1S2/RRR Abd: soft, Non tender, non distended, BS present Extremities: Unna boots, 1+ edema extending to upper thighs Skin: as above   Discharge Instructions   Discharge Instructions     Amb Referral to Palliative Care   Complete by: As directed    Poor prognosis with  recurrent admissions for CHF, multiple myeloma, 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   Diet - low sodium heart healthy   Complete by: As  directed    Discharge wound care:   Complete by: As directed    Routine   Increase activity slowly   Complete by: As directed       Allergies as of 06/12/2024       Reactions   Aldactone  [spironolactone ] Other (See Comments)   Dyspnea    Amoxil  [amoxicillin ] Palpitations, Other (See Comments)   Tachycardia   Zebeta  [bisoprolol ] Shortness Of Breath   Calan  [verapamil ] Other (See Comments)   Myalgias    Crestor  [rosuvastatin ] Other (See Comments)   Myalgias    Flovent  [fluticasone  Propionate] Other (See Comments)   Leg cramps   Livalo  [pitavastatin ] Other (See Comments)   Myalgias    Lyrica  [pregabalin ] Swelling   Neurontin [gabapentin] Swelling   Norvasc  [amlodipine ] Swelling   Low extremity edema   Zetia  [ezetimibe ] Other (See Comments)   Myalgias   Zocor  [simvastatin ] Other (See Comments)   Myalgias    Lotensin [benazepril] Cough   Polytrim  [polymyxin B -trimethoprim ] Swelling        Medication List     TAKE these medications    acetaminophen  500 MG tablet Commonly known as: TYLENOL  Take 500 mg by mouth every 6 (six) hours as needed for mild pain (pain score 1-3).   albuterol  (2.5 MG/3ML) 0.083% nebulizer solution Commonly known as: PROVENTIL  Take 3 mLs (2.5 mg total) by nebulization every 6 (six) hours as needed (during COPD Exacerbation).   albuterol  108 (90 Base) MCG/ACT inhaler Commonly known as: VENTOLIN  HFA INHALE 2 PUFFS INTO THE LUNGS EVERY 4 HOURS AS NEEDED FOR AHEEZING OR SHORTNESS OF BREATH   bumetanide  1 MG tablet Commonly known as: BUMEX  Take 2 tablets (2 mg total) by mouth daily. What changed:  how much to take when to take this   CertaVite/Antioxidants Tabs Take 1 tablet by mouth daily.   cholecalciferol  25 MCG (1000 UNIT) tablet Commonly known as: VITAMIN D3 Take 1,000 Units by mouth in the morning.   diazepam  5 MG tablet Commonly known as: VALIUM  TAKE ONE TABLET BY MOUTH AT BEDTIME   Eliquis  2.5 MG Tabs tablet Generic drug:  apixaban  TAKE ONE TABLET TWICE DAILY (START 09/18/23 OR AS DIRECTED)   empagliflozin  10 MG Tabs tablet Commonly known as: JARDIANCE  Take 1 tablet (10 mg total) by mouth daily.   estradiol  0.1 MG/GM vaginal cream Commonly known as: ESTRACE  Place 1 Applicatorful vaginally See admin instructions. Apply a small amount vaginally three nights a week on Monday, Wednesday and Friday.   feeding supplement Liqd Take 237 mLs by mouth 2 (two) times daily between meals.   guaiFENesin  600 MG 12 hr tablet Commonly known as: MUCINEX  Take 1 tablet (600 mg total) by mouth 2 (two) times daily as needed for to loosen phlegm or cough. What changed: when to take this   HYDROcodone -acetaminophen  5-325 MG tablet Commonly known as: NORCO/VICODIN TAKE ONE TABLET EVERY 8 HOURS AS NEEDED FOR PAIN What changed: See the new instructions.   metolazone  5 MG tablet Commonly known as: ZAROXOLYN  Take metolazone  twice a day on Mondays and Thursdays, take extra potassium 40 mEq with metolazone    midodrine  10 MG tablet Commonly known as: PROAMATINE  Take 1 tablet (10 mg total) by mouth 3 (three) times daily with meals.   montelukast  5 MG chewable tablet Commonly known as: SINGULAIR  Chew 1 tablet (5 mg total) by mouth at bedtime.  ondansetron  4 MG tablet Commonly known as: Zofran  Take 1 tablet (4 mg total) by mouth every 8 (eight) hours as needed for nausea or vomiting.   potassium chloride  SA 20 MEQ tablet Commonly known as: KLOR-CON  M Take potassium 40 mEq daily, take extra 40 EQ of potassium on the days you take metolazone    pregabalin  100 MG capsule Commonly known as: LYRICA  Take 1 capsule (100 mg total) by mouth at bedtime for 15 days.   Zinc  Sulfate 220 (50 Zn) MG Tabs Take 1 tablet (220 mg total) by mouth daily.               Discharge Care Instructions  (From admission, onward)           Start     Ordered   06/12/24 0000  Discharge wound care:       Comments: Routine   06/12/24  1105           Allergies  Allergen Reactions   Aldactone  [Spironolactone ] Other (See Comments)    Dyspnea    Amoxil  [Amoxicillin ] Palpitations and Other (See Comments)    Tachycardia   Zebeta  [Bisoprolol ] Shortness Of Breath   Calan  [Verapamil ] Other (See Comments)    Myalgias    Crestor  [Rosuvastatin ] Other (See Comments)    Myalgias    Flovent  [Fluticasone  Propionate] Other (See Comments)    Leg cramps   Livalo  [Pitavastatin ] Other (See Comments)    Myalgias    Lyrica  [Pregabalin ] Swelling   Neurontin [Gabapentin] Swelling   Norvasc  [Amlodipine ] Swelling    Low extremity edema   Zetia  [Ezetimibe ] Other (See Comments)    Myalgias   Zocor  [Simvastatin ] Other (See Comments)    Myalgias    Lotensin [Benazepril] Cough   Polytrim  [Polymyxin B -Trimethoprim ] Swelling    Follow-up Information     [facility] Follow up.   Why: outpatient palliative services Contact information: [phone]        Burchette, Wolm ORN, MD. Go on 06/26/2024.   Specialty: Family Medicine Why: @3 :15pm please arrive @3 :00pm Contact information: [address] [place] [facility] [place] [address] [phone]                  The results of significant diagnostics from this hospitalization (including imaging, microbiology, ancillary and laboratory) are listed below for reference.    Significant Diagnostic Studies: DG Chest 2 View Result Date: 06/04/2024 CLINICAL DATA:  Leg edema EXAM: CHEST - 2 VIEW COMPARISON:  Chest radiograph May 22, 2024 FINDINGS: Interval slight decrease in moderate right-sided pleural effusion. Small left pleural effusion is stable to prior. Patchy bilateral airspace opacities and vascular congestion are grossly similar to prior. Ground-glass attenuation of right lung base is improved to prior. Status post CABG. Dual lead pacer in place. No acute osseous abnormality. IMPRESSION: Interval slight decrease in moderate right-sided pleural effusion. Stable  small left pleural effusion. Slight improvement of right lung base ground-glass attenuation/atelectasis. Electronically Signed   By: Megan  Zare M.D.   On: 06/04/2024 13:44   DG Chest 2 View Result Date: 05/22/2024 CLINICAL DATA:  Shortness of breath. EXAM: CHEST - 2 VIEW COMPARISON:  05/18/2024. FINDINGS: Low lung volume. Mild-to-moderate diffuse pulmonary vascular congestion, grossly similar to the prior study. Redemonstration of moderate right and small left pleural effusion without significant interval change. There are probable underlying compressive atelectatic changes in the bilateral lungs, also right more than left. Bilateral lung fields are otherwise clear. No pneumothorax. Evaluation of cardiomediastinal silhouette is limited due  to right lower hemithorax opacification. Sternotomy wires and left-sided dual lead cardiac pacemaker noted. No acute osseous abnormalities. Lower cervical spinal fixation hardware noted. The soft tissues are within normal limits. IMPRESSION: Mild-to-moderate pulmonary vascular congestion. Moderate right and small left pleural effusion. No significant interval change since the prior study. Electronically Signed   By: Ree Molt M.D.   On: 05/22/2024 08:59   IR BONE MARROW BIOPSY & ASPIRATION Result Date: 05/21/2024 INDICATION: Elevated kappa light chain within serum and urine, concern for myeloma EXAM: FLUOROSCOPICALLY GUIDED RIGHT ILIAC BONE MARROW ASPIRATION AND CORE BIOPSY Date:  05/21/2024 05/21/2024 9:26 am Radiologist:  M. Frederic Specking, MD Guidance:  CT FLUOROSCOPY: Fluoroscopy Time: 0 minutes 6 seconds (1 mGy). MEDICATIONS: 1% LIDOCAINE  LOCAL ANESTHESIA/SEDATION: 2.0 mg IV Versed ; 100 mcg IV Fentanyl  Moderate Sedation Time:  7 MINUTES The patient was continuously monitored during the procedure by the interventional radiology nurse under my direct supervision. CONTRAST:  NONE. COMPLICATIONS: None PROCEDURE: Informed consent was obtained from the patient following  explanation of the procedure, risks, benefits and alternatives. The patient understands, agrees and consents for the procedure. All questions were addressed. A time out was performed. The patient was positioned prone and fluoroscopic localization was performed of the pelvis to demonstrate the iliac marrow spaces. Maximal barrier sterile technique utilized including caps, mask, sterile gowns, sterile gloves, large sterile drape, hand hygiene, and Betadine prep. Under sterile conditions and local anesthesia, an 11 gauge coaxial bone biopsy needle was advanced into the right iliac marrow space. Needle position was confirmed with fluoroscopic imaging. Initially, bone marrow aspiration was performed. Next, the 11 gauge outer cannula was utilized to obtain a right iliac bone marrow core biopsy. Needle was removed. Hemostasis was obtained with compression. The patient tolerated the procedure well. Samples were prepared with the cytotechnologist. No immediate complications. IMPRESSION: Fluoroscopic guided right iliac bone marrow aspiration and core biopsy. Electronically Signed   By: CHRISTELLA.  Shick M.D.   On: 05/21/2024 11:04   DG Bone Survey Met Result Date: 05/19/2024 CLINICAL DATA:  Monoclonal gammopathy of unknown significance. EXAM: METASTATIC BONE SURVEY COMPARISON:  09/11/2023. FINDINGS: No lytic or destructive lesion is seen within the bones. There is a moderate pleural effusion on the right and small pleural effusion on the left with associated atelectasis or infiltrate. The heart is enlarged and sternotomy wires are noted over the midline. There is atherosclerotic calcification of the aorta. Cervical spinal fusion hardware is noted. A multi lead pacemaker device is present over the left chest. Vascular calcifications are present in the lower extremities bilaterally. Degenerative changes are noted in the cervical, thoracic, and lumbar spine. Stable mild compression deformities are noted in the lower thoracic spine at  T10 and T11. A stable compression deformity is seen in the superior endplate at L1. Lumbar spinal fusion hardware is noted at L5-S1. IMPRESSION: 1. No lytic or destructive lesion in the bones. 2. Stable compression deformities at T10, T11, and L1. 3. Moderate right pleural effusion and small left pleural effusion with atelectasis or infiltrate. Electronically Signed   By: Leita Birmingham M.D.   On: 05/19/2024 14:54   DG CHEST PORT 1 VIEW Result Date: 05/18/2024 CLINICAL DATA:  Pleural effusion.  CHF. EXAM: PORTABLE CHEST 1 VIEW COMPARISON:  Chest radiograph 05/14/2024 FINDINGS: Left-sided pacemaker remains in place. Cardiomegaly is stable. Prior median sternotomy. Aortic atherosclerosis. Right pleural effusion without significant interval change. There is a small left pleural effusion that has increased. Pulmonary edema is similar. No pneumothorax. IMPRESSION: 1. Right  pleural effusion without significant interval change. 2. Small left pleural effusion has increased. 3. Pulmonary edema is similar. Electronically Signed   By: Andrea Gasman M.D.   On: 05/18/2024 15:09   CARDIAC CATHETERIZATION Result Date: 05/17/2024 Right heart catheterization via the right brachial vein showed mildly elevated filling pressures, moderate pulmonary hypertension and normal cardiac output. RA: 8 mmHg, RV 51/2 mmHg, PW: 18 mmHg, PA: 50/10 with a mean of 28 mmHg, cardiac output is 4.64 with an index of 2.92. Recommendations: She can likely be switched to an oral diuretic tomorrow.  DG Chest 2 View Result Date: 05/14/2024 CLINICAL DATA:  Shortness of breath. Cough. Bilateral leg swelling. EXAM: CHEST - 2 VIEW COMPARISON:  04/24/2024 and CT chest 09/10/2023. FINDINGS: Heart is enlarged, as before. Thoracic aorta is calcified. Pacemaker lead tips are in the right atrium and right ventricle. Mild interstitial prominence and indistinctness with a moderate right pleural effusion and small left pleural effusion. IMPRESSION: Congestive  heart failure. Electronically Signed   By: Newell Eke M.D.   On: 05/14/2024 08:18    Microbiology: No results found for this or any previous visit (from the past 240 hours).   Labs: Basic Metabolic Panel: Recent Labs  Lab 06/06/24 0232 06/07/24 0232 06/09/24 0243 06/10/24 0334 06/11/24 0243 06/11/24 1115 06/12/24 0335  NA 141   < > 141 142 144 143 141  K 3.3*   < > 2.8* 2.8* 5.2* 4.2 3.2*  CL 106   < > 102 103 105 104 98  CO2 26   < > 26 30 27 27 31   GLUCOSE 101*   < > 101* 93 101* 151* 100*  BUN 27*   < > 29* 28* 33* 34* 36*  CREATININE 1.40*   < > 1.38* 1.39* 1.63* 1.66* 1.63*  CALCIUM  8.5*   < > 8.6* 8.6* 8.8* 8.9 9.0  MG 2.1  --   --   --   --   --   --    < > = values in this interval not displayed.   Liver Function Tests: Recent Labs  Lab 06/07/24 0232 06/08/24 0236  AST 24 25  ALT 11 13  ALKPHOS 77 77  BILITOT 0.6 0.4  PROT 4.6* 4.9*  ALBUMIN  1.7* 1.9*   No results for input(s): LIPASE, AMYLASE in the last 168 hours. No results for input(s): AMMONIA in the last 168 hours. CBC: Recent Labs  Lab 06/07/24 0232 06/08/24 0236  WBC 5.9 7.3  NEUTROABS 3.5 4.6  HGB 9.8* 11.1*  HCT 30.5* 33.4*  MCV 93.8 91.8  PLT 107* 112*   Cardiac Enzymes: No results for input(s): CKTOTAL, CKMB, CKMBINDEX, TROPONINI in the last 168 hours. BNP: BNP (last 3 results) No results for input(s): BNP in the last 8760 hours.  ProBNP (last 3 results) Recent Labs    04/15/24 1604 05/14/24 0731 06/04/24 1305  PROBNP 14,671.0* 15,910.0* 28,847.0*    CBG: No results for input(s): GLUCAP in the last 168 hours.     Signed:  Sigurd Pac MD.  [facility] 06/12/2024, 11:05 AM

## 2024-06-12 NOTE — Progress Notes (Signed)
 Catherine [name] feels okay this morning.  She really has no specific complaints pain in the right leg.  She has a right lower leg wrapped.  She thinks that the wrapping might be a little bit too tight.  Her labs this morning shows sodium 141.  Potassium 3.2.  BUN 36 with creatinine 1.63.  Of note, her her pro BNP is about 29,000.  It is hard to say how well she is eating.  I think she might be out of bed a little bit.  Hopefully, she will to go home.  If she gets, then we can see about doing treatment for the myeloma/amyloid.  Again, this may or may not help her cardiac function.   Vital signs show temperature of 98.4.  Pulse 59.  Blood pressure 106/44.  Head neck exam shows no ocular or oral lesions.  There are no palpable cervical or supraclavicular lymph nodes.  Lungs are clear bilaterally.  Cardiac exam regular rate and rhythm.  Abdomen is soft.  Bowel sounds are present.  She has no fluid wave.  There is no palpable liver or spleen tip.  Extremities shows wrapping on the lower legs.  Again, she has congestive heart failure.  She has incredibly high pro-BNP.  She may go home today.  If so, we will try to get her set up with treatment later this week.  I do appreciate everybody's help on 3 E.  I know this is incredibly challenging.   Jeralyn Crease, MD  [place] 12:12

## 2024-06-12 NOTE — Progress Notes (Signed)
 Progress Note  Patient Name: Catherine [name] Date of Encounter: 06/12/2024 Primary Cardiologist: Annabella Scarce, MD   Subjective   Overnight no new events. Low K, Stable Creatinine  Vital Signs    Vitals:   06/11/24 2031 06/11/24 2320 06/12/24 0434 06/12/24 0718  BP: (!) 133/44 (!) 143/55 (!) 106/44 (!) 114/54  Pulse: 66 65 (!) 59 60  Resp: 18 18 18 17   Temp: 98.6 F (37 C) 98.4 F (36.9 C) 98.4 F (36.9 C) (!) 97.4 F (36.3 C)  TempSrc: Oral Oral Oral Oral  SpO2: 95% 99% 98% 100%  Weight:   56.7 kg   Height:        Intake/Output Summary (Last 24 hours) at 06/12/2024 1034 Last data filed at 06/12/2024 0817 Gross per 24 hour  Intake 910 ml  Output 1300 ml  Net -390 ml   Filed Weights   06/10/24 0457 06/11/24 0416 06/12/24 0434  Weight: 56.8 kg 56.2 kg 56.7 kg    Physical Exam   GEN: No acute distress.   Cardiac: RRR, no murmurs, rubs, or gallops.  Respiratory: Clear to auscultation bilaterally. GI: Soft, nontender, non-distended  MS: +3 edema  Labs   Telemetry: AP/PV   Chemistry Recent Labs  Lab 06/07/24 0232 06/08/24 0236 06/09/24 0243 06/11/24 0243 06/11/24 1115 06/12/24 0335  NA 140 141   < > 144 143 141  K 3.5 3.5   < > 5.2* 4.2 3.2*  CL 105 106   < > 105 104 98  CO2 24 27   < > 27 27 31   GLUCOSE 94 105*   < > 101* 151* 100*  BUN 27* 21   < > 33* 34* 36*  CREATININE 1.35* 1.35*   < > 1.63* 1.66* 1.63*  CALCIUM  8.4* 8.5*   < > 8.8* 8.9 9.0  PROT 4.6* 4.9*  --   --   --   --   ALBUMIN  1.7* 1.9*  --   --   --   --   AST 24 25  --   --   --   --   ALT 11 13  --   --   --   --   ALKPHOS 77 77  --   --   --   --   BILITOT 0.6 0.4  --   --   --   --   GFRNONAA 38* 38*   < > 31* 30* 31*  ANIONGAP 11 8   < > 12 12 12    < > = values in this interval not displayed.     Hematology Recent Labs  Lab 06/07/24 0232 06/08/24 0236  WBC 5.9 7.3  RBC 3.25* 3.64*  HGB 9.8* 11.1*  HCT 30.5* 33.4*  MCV 93.8 91.8  MCH 30.2 30.5  MCHC 32.1 33.2   RDW 13.9 13.7  PLT 107* 112*   BNP    Cardiac Studies   Cardiac Studies & Procedures   ______________________________________________________________________________________________ CARDIAC CATHETERIZATION  CARDIAC CATHETERIZATION 05/17/2024  Conclusion Right heart catheterization via the right brachial vein showed mildly elevated filling pressures, moderate pulmonary hypertension and normal cardiac output.  RA: 8 mmHg, RV 51/2 mmHg, PW: 18 mmHg, PA: 50/10 with a mean of 28 mmHg, cardiac output is 4.64 with an index of 2.92.  Recommendations: She can likely be switched to an oral diuretic tomorrow.     ECHOCARDIOGRAM  ECHOCARDIOGRAM COMPLETE 04/24/2024  Narrative ECHOCARDIOGRAM REPORT    Patient Name:   Catherine [name]  Klem Date of Exam: 04/24/2024 Medical Rec #:  [id number]      Height:       63.0 in Accession #:    [id number]     Weight:       115.0 lb Date of Birth:  Aug 29, 1935     BSA:          1.528 m Patient Age:    88 years       BP:           139/64 mmHg Patient Gender: F              HR:           61 bpm. Exam Location:  Inpatient  Procedure: 2D Echo, Cardiac Doppler and Color Doppler (Both Spectral and Color Flow Doppler were utilized during procedure).  Indications:    CHF-Acute Diastolic I50.31  History:        Patient has prior history of Echocardiogram examinations, most recent 11/02/2023. CHF, CAD, Prior CABG, COPD and CKD, stage 3, Arrythmias:Atrial Fibrillation, Signs/Symptoms:Dyspnea; Risk Factors:Hypertension and Dyslipidemia.  Mitral Valve: 29 mm Medtronic bioprosthetic valve valve is present in the mitral position. Procedure Date: 10/14/2016.  Sonographer:    Thea Norlander RCS Referring Phys: [id number] PAULA V ROSS  IMPRESSIONS   1. No resting LVOT gradient. Left ventricular ejection fraction, by estimation, is 70 to 75%. The left ventricle has hyperdynamic function. The left ventricle has no regional wall motion abnormalities. Left ventricular  diastolic parameters are indeterminate. 2. Right ventricular systolic function is mildly reduced. The right ventricular size is normal. There is moderately elevated pulmonary artery systolic pressure. The estimated right ventricular systolic pressure is 50.5 mmHg. 3. Left atrial size was severely dilated. 4. Right atrial size was mildly dilated. 5. Small, echodensity noted on the chordal apparatus unchanged from 2023 PLAX view. There new LVOT is small related to valve struts with LVOT flow acceleration. The mitral valve has been repaired/replaced. No evidence of mitral valve regurgitation. The mean mitral valve gradient is 8.0 mmHg. There is a 29 mm Medtronic bioprosthetic valve present in the mitral position. Procedure Date: 10/14/2016. 6. Tricuspid valve regurgitation is moderate. 7. The aortic valve is tricuspid. Aortic valve regurgitation is not visualized. Mild to moderate aortic valve stenosis. Aortic valve area, by VTI measures 1.04 cm. Aortic valve mean gradient measures 19.0 mmHg. Aortic valve Vmax measures 3.02 m/s. 8. The inferior vena cava is dilated in size with <50% respiratory variability, suggesting right atrial pressure of 15 mmHg.  Comparison(s): Prior images reviewed side by side. Mitral gradients have increased from 2023 with similar heart rates (60s); aortic valve gradients have increased; a portion of this is related to dynamic LV function.  FINDINGS Left Ventricle: No resting LVOT gradient. Left ventricular ejection fraction, by estimation, is 70 to 75%. The left ventricle has hyperdynamic function. The left ventricle has no regional wall motion abnormalities. The left ventricular internal cavity size was normal in size. There is no left ventricular hypertrophy. Left ventricular diastolic parameters are indeterminate.  Right Ventricle: The right ventricular size is normal. No increase in right ventricular wall thickness. Right ventricular systolic function is mildly reduced.  There is moderately elevated pulmonary artery systolic pressure. The tricuspid regurgitant velocity is 2.98 m/s, and with an assumed right atrial pressure of 15 mmHg, the estimated right ventricular systolic pressure is 50.5 mmHg.  Left Atrium: Left atrial size was severely dilated.  Right Atrium: Right atrial size was mildly dilated.  Pericardium: There is no  evidence of pericardial effusion.  Mitral Valve: Small, echodensity noted on the chordal apparatus unchanged from 2023 PLAX view. There new LVOT is small related to valve struts with LVOT flow acceleration. The mitral valve has been repaired/replaced. No evidence of mitral valve regurgitation. There is a 29 mm Medtronic bioprosthetic valve present in the mitral position. Procedure Date: 10/14/2016. MV peak gradient, 23.7 mmHg. The mean mitral valve gradient is 8.0 mmHg with average heart rate of 60 bpm.  Tricuspid Valve: The tricuspid valve is normal in structure. Tricuspid valve regurgitation is moderate . No evidence of tricuspid stenosis.  Aortic Valve: The aortic valve is tricuspid. Aortic valve regurgitation is not visualized. Mild to moderate aortic stenosis is present. Aortic valve mean gradient measures 19.0 mmHg. Aortic valve peak gradient measures 36.5 mmHg. Aortic valve area, by VTI measures 1.04 cm.  Pulmonic Valve: The pulmonic valve was normal in structure. Pulmonic valve regurgitation is trivial. No evidence of pulmonic stenosis.  Aorta: The aortic root and ascending aorta are structurally normal, with no evidence of dilitation.  Venous: The inferior vena cava is dilated in size with less than 50% respiratory variability, suggesting right atrial pressure of 15 mmHg.  IAS/Shunts: The atrial septum is grossly normal.   LEFT VENTRICLE PLAX 2D LVIDd:         4.57 cm   Diastology LVIDs:         3.08 cm   LV e' medial:    6.16 cm/s LV PW:         0.64 cm   LV E/e' medial:  32.3 LV IVS:        0.93 cm   LV e' lateral:    8.92 cm/s LVOT diam:     1.60 cm   LV E/e' lateral: 22.3 LV SV:         63 LV SV Index:   41 LVOT Area:     2.01 cm   RIGHT VENTRICLE            IVC RV S prime:     7.22 cm/s  IVC diam: 2.27 cm TAPSE (M-mode): 1.6 cm  LEFT ATRIUM              Index        RIGHT ATRIUM           Index LA diam:        5.80 cm  3.79 cm/m   RA Area:     21.15 cm LA Vol (A2C):   97.8 ml  63.99 ml/m  RA Volume:   59.55 ml  38.96 ml/m LA Vol (A4C):   119.0 ml 77.86 ml/m LA Biplane Vol: 108.0 ml 70.66 ml/m AORTIC VALVE AV Area (Vmax):    1.09 cm AV Area (Vmean):   1.05 cm AV Area (VTI):     1.04 cm AV Vmax:           302.00 cm/s AV Vmean:          198.000 cm/s AV VTI:            0.604 m AV Peak Grad:      36.5 mmHg AV Mean Grad:      19.0 mmHg LVOT Vmax:         164.00 cm/s LVOT Vmean:        103.000 cm/s LVOT VTI:          0.313 m LVOT/AV VTI ratio: 0.52  AORTA Ao Root diam: 3.15 cm Ao Asc diam:  3.50 cm  MITRAL VALVE                TRICUSPID VALVE MV Area (PHT): 2.34 cm     TR Peak grad:   35.5 mmHg MV Area VTI:   0.94 cm     TR Vmax:        298.00 cm/s MV Peak grad:  23.7 mmHg MV Mean grad:  8.0 mmHg     SHUNTS MV Vmax:       2.44 m/s     Systemic VTI:  0.31 m MV Vmean:      110.0 cm/s   Systemic Diam: 1.60 cm MV Decel Time: 324 msec MV E velocity: 199.00 cm/s  Stanly Leavens MD Electronically signed by Stanly Leavens MD Signature Date/Time: 04/24/2024/5:19:23 PM    Final    MONITORS  LONG TERM MONITOR-LIVE TELEMETRY (3-14 DAYS) 10/10/2023  Narrative 7 Day Zio Monitor  Quality: Fair.  Baseline artifact. Predominant rhythm: Sinus arrhythmia with long first degree AV block, junctional rhythm Average heart rate: 59 bpm Max heart rate: 179 bpm Min heart rate: 27 bpm Pauses >2.5 seconds: Sinus arrest lasting up to 6.9 seconds  469 episodes of SVT lasting up to 1 hour 32 seconds Rare (<1%) PVCs and PACs.   Tiffany C. 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 Timmy has plans for possible intervention - - PC consultation previously recommend; she is waiting for them to come - switch to bumex  2 mg PO daily - continue SGLT2i - received K today  PAF CHADVASC NA P-SVT - SSS s/p PPM - no plans for AV nodal agents, continue DOAC, I do not predict future SVT ablation  CAD - stable s/p 1V CABG  Mild/Mod AS MR s/p MVR - no change in therapy; gradients related to dynamic function, unable to beta blockade  Chronic hypotension - on midodrine   GOC - discussed GOC with daughter Mliss; they will see if she can get therapy and if this does not work may re-assess  For questions or updates, please contact [facility] Please consult [email or web address] for contact info under Cardiology/STEMI.      Stanly Leavens, MD FASE [facility] Cardiologist [facility]  [address] [place], [address] [place], [place] [address] [phone]  10:35 AM

## 2024-06-12 NOTE — Care Management Important Message (Signed)
 Important Message  Patient Details  Name: SPARKLE AUBE MRN: [id number] Date of Birth: Jul 13, 1935   Important Message Given:  Yes - Medicare IM     Vonzell Arrie Sharps 06/12/2024, 11:42 AM

## 2024-06-12 NOTE — TOC Transition Note (Signed)
 Transition of Care [facility]) - Discharge Note   Patient Details  Name: Catherine [name] MRN: [id number] Date of Birth: [birth year]  Transition of [facility]) CM/SW Contact:  Waddell Barnie Rama, RN Phone Number: 06/12/2024, 11:09 AM   Clinical Narrative:    For dc today, NCM notified Amy with [facility] and [facility] for outpatient palliative services,  she has transport home.      Barriers to Discharge: Continued Medical Work up   Patient Goals and CMS Choice Patient states their goals for this hospitalization and ongoing recovery are:: return home CMS Medicare.gov Compare Post Acute Care list provided to:: Patient Choice offered to / list presented to : Patient      Discharge Placement                       Discharge Plan and Services Additional resources added to the After Visit Summary for   In-house Referral: NA Discharge Planning Services: CM Consult Post Acute Care Choice: Resumption of Svcs/PTA Provider, Home Health          DME Arranged: N/A DME Agency: NA       HH Arranged: PT, OT HH Agency: [facility] Date [facility] Agency Contacted: 06/05/24 Time HH Agency Contacted: 1505 Representative spoke with at [facility] Agency: Amy  Social Drivers of Health (SDOH) Interventions SDOH Screenings   Food Insecurity: No Food Insecurity (06/04/2024)  Housing: Low Risk  (06/04/2024)  Transportation Needs: No Transportation Needs (06/04/2024)  Utilities: Not At Risk (06/04/2024)  Alcohol Screen: Low Risk  (11/06/2021)  Depression (PHQ2-9): Low Risk  (05/25/2024)  Recent Concern: Depression (PHQ2-9) - Medium Risk (04/26/2024)  Financial Resource Strain: Low Risk  (04/09/2022)  Physical Activity: Insufficiently Active (04/09/2022)  Social Connections: Socially Integrated (06/04/2024)  Stress: No Stress Concern Present (04/09/2022)  Tobacco Use: Medium Risk (06/04/2024)     Readmission Risk Interventions    06/05/2024    2:59 PM 05/22/2024    3:49 PM 04/24/2024   12:04 PM  Readmission  Risk Prevention Plan  Transportation Screening Complete Complete Complete  PCP or Specialist Appt within 3-5 Days   Complete  HRI or Home Care Consult   Complete  Social Work Consult for Recovery Care Planning/Counseling   Complete  Palliative Care Screening   Not Applicable  Medication Review Oceanographer) Complete Referral to Pharmacy Referral to Pharmacy  PCP or Specialist appointment within 3-5 days of discharge Complete    HRI or Home Care Consult Complete Complete   SW Recovery Care/Counseling Consult  Complete   Palliative Care Screening -- Not Applicable   Skilled Nursing Facility Not Applicable Not Applicable

## 2024-06-12 NOTE — Progress Notes (Signed)
   Palliative Medicine Inpatient Follow Up Note HPI: 88/F w chronic atrial fibrillation, hypertension, sp bioprosthetic MVR, CAD CABG, stage IIIa chronic kidney disease, COPD and recently diagnosed multiple myeloma who was sent to the hospital by her cardiologist for worsening bilateral lower extremity swelling, dyspnea.   Today's Discussion 06/12/2024  *Please note that this is a verbal dictation therefore any spelling or grammatical errors are due to the Dragon Medical One system interpretation.  Chart reviewed inclusive of vital signs, progress notes, laboratory results, and diagnostic images.   I met with Catherine [name] this morning at bedside. She is awake and alert. She shares that she has been told the plan will be for her transition home today. She is looking forward to getting out of the hospital.   We discussed the plan for the OP palliative team through [facility] to see Catherine [name] upon discharge.   Questions and concerns addressed/Palliative Support Provided.   Objective Assessment: Vital Signs Vitals:   06/12/24 0718 06/12/24 1058  BP: (!) 114/54 (!) 119/53  Pulse: 60 84  Resp: 17 18  Temp: (!) 97.4 F (36.3 C) 98.8 F (37.1 C)  SpO2: 100% 98%    Intake/Output Summary (Last 24 hours) at 06/12/2024 1153 Last data filed at 06/12/2024 9182 Gross per 24 hour  Intake 910 ml  Output 1300 ml  Net -390 ml   Last Weight  Most recent update: 06/12/2024  4:37 AM    Weight  56.7 kg (125 lb)            Gen:  Elderly Caucasian F chronically ill in appearance HEENT: moist mucous membranes CV: Regular rate and rhythm  PULM:  On 2LPM [place], breathing is even and nonlabored ABD: soft/nontender  EXT: BLE edema  Neuro: Alert and oriented x3   SUMMARY OF RECOMMENDATIONS   DNAR/DNI  ADs in [place]  OP Palliative support through [facility]  Plan to discharge today ______________________________________________________________________________________ Catherine [name] [place] [facility]  Medicine Team Team Cell Phone: [phone] Please utilize secure chat with additional questions, if there is no response within 30 minutes please call the above phone number  Time Spent: 25  Palliative Medicine Team providers are available by phone from 7am to 7pm daily and can be reached through the team cell phone.  Should this patient require assistance outside of these hours, please call the patient's attending physician.

## 2024-06-12 NOTE — Progress Notes (Signed)
 Discharge Nurse Summary: DC order noted per MD. DC RN at bedside with patient. Patient agreeable with discharge plan, states family will arrive soon for pickup.   AVS printed/reviewed. PIV removed, skin intact. VSS on RA. No DME needs. No home/TOC meds. CP/Edu resolved. Telemonitor cords returned to charging station. All belongings accounted for. Unna boots to BLE removed, skin integrity improved. Small skin tear noted to posterior R knee 2cm x 2cm cleaned with saline applied foam dressing, see LDAs for more information. Patient wheeled downstairs for discharge by private auto.   Rosario EMERSON Lund, RN

## 2024-06-13 ENCOUNTER — Telehealth: Payer: Self-pay

## 2024-06-13 ENCOUNTER — Encounter: Payer: Self-pay | Admitting: Hematology & Oncology

## 2024-06-13 ENCOUNTER — Encounter: Payer: Self-pay | Admitting: *Deleted

## 2024-06-13 ENCOUNTER — Other Ambulatory Visit: Payer: Self-pay | Admitting: *Deleted

## 2024-06-13 DIAGNOSIS — E854 Organ-limited amyloidosis: Secondary | ICD-10-CM

## 2024-06-13 NOTE — Progress Notes (Signed)
 Patient was discharged from the hospital earlier this week. Dr Timmy wanted her to come in this week to initiate treatment. Patient was called, and she refused to schedule for this week but agreed to next Wednesday. [name] for follow up and first treatment. Chemo education will also be done that day.   Oncology Nurse Navigator Documentation     06/13/2024   10:00 AM  Oncology Nurse Navigator Flowsheets  Navigator Follow Up Date: 06/20/2024  Navigator Follow Up Reason: New Patient Appointment;Chemotherapy  Navigator Location [facility]  Navigator Encounter Type Appt/Treatment Plan Review  Patient Visit Type MedOnc  Treatment Phase Pre-Tx/Tx Discussion  Barriers/Navigation Needs Coordination of Care  Interventions Coordination of Care  Acuity Level 2-Minimal Needs (1-2 Barriers Identified)  Coordination of Care Appts  Support Groups/Services Friends and Family  Time Spent with Patient 30

## 2024-06-13 NOTE — Progress Notes (Signed)
 Pharmacist Chemotherapy Monitoring - Initial Assessment    Anticipated start date: 06/20/24   The following has been reviewed per standard work regarding the patient's treatment regimen: The patient's diagnosis, treatment plan and drug doses, and organ/hematologic function Lab orders and baseline tests specific to treatment regimen  The treatment plan start date, drug sequencing, and pre-medications Prior authorization status  Patient's documented medication list, including drug-drug interaction screen and prescriptions for anti-emetics and supportive care specific to the treatment regimen The drug concentrations, fluid compatibility, administration routes, and timing of the medications to be used The patient's access for treatment and lifetime cumulative dose history, if applicable  The patient's medication allergies and previous infusion related reactions, if applicable   Changes made to treatment plan:  treatment plan date  Follow up needed:  RBC phenotype and Type & Cross needed prior to first tx.   Micael Olam Browning, [facility], 06/13/2024  11:17 AM

## 2024-06-13 NOTE — Transitions of Care (Post Inpatient/ED Visit) (Signed)
   06/13/2024  Name: Catherine [name] MRN: [id number] DOB: [birth year]  Today's TOC FU Call Status: Today's TOC FU Call Status:: Successful TOC FU Call Completed TOC FU Call Complete Date: 06/13/24 Patient's Name and Date of Birth confirmed.  Transition Care Management Follow-up Telephone Call Date of Discharge: 06/12/24 Discharge Facility: [facility]) Type of Discharge: Inpatient Admission Primary Inpatient Discharge Diagnosis:: Acute on chronic diastolic CHF How have you been since you were released from the hospital?: Same  Placed call to patient to complete TOC and patient reports that she does not feel good and does not want to talk on the phone. Reviewed recommendation from oncology and patient declines treatment until next week.   Plan: will plan to call patient back in 1 day.   Alan Ee, RN, BSN, CEN [employer]- Transition of Care Team.  [facility] [phone]

## 2024-06-14 ENCOUNTER — Other Ambulatory Visit: Payer: Self-pay

## 2024-06-14 ENCOUNTER — Telehealth: Payer: Self-pay

## 2024-06-14 ENCOUNTER — Encounter: Payer: Self-pay | Admitting: Hematology & Oncology

## 2024-06-14 NOTE — Transitions of Care (Post Inpatient/ED Visit) (Signed)
   06/14/2024  Name: Catherine [name] MRN: [id number] DOB: October 03, 1934  Today's TOC FU Call Status: Today's TOC FU Call Status:: Unsuccessful Call (2nd Attempt) Unsuccessful Call (2nd Attempt) Date: 06/14/24 (attempted to reach patient after she could not complete call yesterday.)  Attempted to reach the patient regarding the most recent Inpatient/ED visit.  Follow Up Plan: Additional outreach attempts will be made to reach the patient to complete the Transitions of Care (Post Inpatient/ED visit) call.   Alan Ee, RN, BSN, CEN [employer]- Transition of Care Team.  [facility] [phone]

## 2024-06-15 ENCOUNTER — Telehealth: Payer: Self-pay

## 2024-06-15 NOTE — Transitions of Care (Post Inpatient/ED Visit) (Signed)
   06/15/2024  Name: Catherine [name] MRN: [id number] DOB: 1934-11-06  Today's TOC FU Call Status: Today's TOC FU Call Status:: Unsuccessful Call (3rd Attempt) TOC FU Call Complete Date: 06/15/24  Attempted to reach the patient regarding the most recent Inpatient/ED visit. Unable to reach patient for additional follow up.   Follow Up Plan: No further outreach attempts will be made at this time. We have been unable to contact the patient.  Alan Ee, RN, BSN, CEN [employer]- Transition of Care Team.  [facility] [phone]

## 2024-06-17 NOTE — Progress Notes (Signed)
 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  In January 2018 she underwent bioprosthetic MVR, CABG x 1, and bilateral maze procedure with Dr. Dusty.   Seen by Dr. Scarce 11/2020 with improvement in abdominal bloating after transition from Lasix  to torsemide .  Her persistent LE edema was thought to be due to venous insufficiency.  She was unable to tolerate compression socks and declined referral to [facility].  Hospitalized 10/2021 with COPD exacerbation and HFpEF.  Echo LVEF 60 to 65%, RV mildly reduced, RVSP 58 mmHg, moderate BAE, normal-appearing bioprosthetic MVR, moderate TR.  She was admitted 01/05/2022 for acute on chronic diastolic heart failure with acute hypoxic respiratory failure and COPD exacerbation.  She developed bradycardia, metoprolol  was reduced.  She was trialed on amiodarone  but did not tolerate.  At clinic visit 03/04/2022 her losartan  torsemide  were transition to Entresto  and Bumex .  03/2022 metolazone  added once per week for volume control.  Echo 11/03/2022 with normal LVEF 60 to 65%, RV moderately reduced function, mildly elevated PASP, MV prosthesis functioning appropriately, mild aortic stenosis with mean gradient 11 mmHg.  Despite further reduction and discontinuation of metoprolol , bradycardia persisted.  Admission 12/25-12/27/24 with placement of dual-chamber PPM.   Her atrial lead is passive per EP team.  She saw Dr. Kitty 12/26/2023 most bothered by leg and back pain.  Device interrogation appropriate in clinic.  [name], Toprol  was increased to 100 mg at bedtime, however she felt more dyspnea and requested to reduce back to 50 mg dose.  Seen 01/03/2024 by Catherine Finder, NP.  She reported a difficult time adjusting to PPM. She had recently fallen on the treadmill and her husband had it removed.  [name] to try to walk outside as the weather gets warmer.  Dyspnea resolved after resuming metoprolol  50 mg at bedtime.  Home BP 110-125/50s-60s, and HR in the 60s and 70s.  Recent weight loss, working on increasing protein with boost in the morning and trying to eat even when not hungry. Taking extra Bumex  on Monday Wednesday and Friday.  Mild bilateral ankle edema that is overall unchanged.  Based on lab results completed that day, she was advised to take Bumex  2 mg daily with no afternoon dosing for fear of over diuresing.  She was advised to take metolazone  as needed for weight gain of 5 pounds in 1 week, do not take more than once per week.  She was later changed to Lopressor  50 mg twice daily by Dr. Kitty.  Admission 6/28-03/27/24 for DOE, edema, possible pneumonia which was later ruled out. Excellent diuresis with IV Bumex  and metolazone  x 1. Advised to take Bumex  2 mg twice daily for 3 days then return to once daily dosing. One episode of AF RVR in the 130s with improvement with IV diltiazem  x 1.   Seen by me on 04/09/24 with multiple complaints, including leg swelling. Experiencing palpitations and generally has not felt well since paceaker  placement in December. Palpitations often occur in the mornings and can persist. She discontinued metoprolol  and bisoprolol  due to side effects including shortness of breath, wheezing, and nausea. Uses a breathing machine and inhaler for shortness of breath. Recently concerned about having pneumonia, with congestion that  has persisted. Concerned about bilateral leg swelling and skin tears. Swelling is often improved in the mornings but worsens throughout the day. She wears support hose but cannot use them on her left leg due to an open wound. Increased Bumex  dosing x 3 days did not improve swelling. Significant pain in her back between her shoulder blades.  Occasional chest pain. No dyspnea, orthopnea, PND, presyncope activity.  Seen by Catherine Arthur, PA on 04/12/24 for follow-up of PPM. Device interrogation reviewed with Dr. Waddell, with concerns for junctional tachycardia.  They discussed amiodarone  and ablation.  Dr. Waddell felt it prudent to pursue ablation to avoid further medications.  [name] to undergo ablation 05/30/2024.  Admission 7/20-7/30/25 for CHF exacerbation. Recent admission 6/28-03/27/24 with worsening shortness of breath, felt to be combination of acute on chronic HFpEF and possible CAP complicated by mild AKI and hyperkalemia. Cardiology was consulted. Felt to have nephrotic range of proteinuria explaing low albumin  and edema. Echo 04/24/24 revealed vigorous LV function 70 to 75%, mildly reduced RV, moderately elevated PASP, increased mitral gradients from 2023 with similar heart rate (60s); aortic valve gradients increased, possibly related to dynamic LV function.  No resting LVOT gradient. She underwent US  guided thoracentesis 7/29 with 1.1 L clear yellow fluid removed.  Discharged on SGLT2i, Bumex  as needed for weight gain 2-3 lbs, midodrine  for hypotension.  Emphasis placed on nutritional support  Seen by me in clinic on 05/03/24, accompanied by her husband, and reporting significant weakness. Weight was stable. She continued to be concerned about chronic leg swelling felt to be secondary to kidney disease.  She was advised to take Bumex  only as needed for weight gain and adequate nutrition including high-protein diet was emphasized.  Admission 8/18-8/28/25 admitted for leg swelling and dyspnea in the  setting of [facility] HFpEF NYHA IV, CXR showed bilateral pleural effusion, pulmonary vascular congestion.  Right heart cath 05/17/2024 showed mildly elevated filling pressures, moderate pulmonary hypertension, and normal cardiac output. Nephrology recommended evaluation by oncology and she was diagnosed with multiple myeloma and AL amyloidosis.  Seen back in ED 05/30/2024 with leg hematoma thought to be caused by compression.  She started wrapping legs which was better tolerated.  She was advised to increase her Bumex  to 2 g twice daily until follow-up. SVT ablation was postponed.   Seen in clinic by Dr. Raford 06/04/2024 at which time she had significantly leakage from leg hematoma.  She reported worsening leg swelling despite taking increased Bumex  and she continued to gain fluid weight.  She reported her breathing is terrible requiring breathing treatments every 4 hours.  She reported improved appetite.  She was felt to be significantly volume overloaded and was sent to the hospital for admission with recommendation for inpatient palliative care involvement.  Admission 9/8-9/16/25 with good diuresis, discharged on Bumex  2 mg daily  and metolazone  twice weekly, continue SGLT2i, midodrine  for hypotension. Palliative care involved, high risk for readmission. Planning to pursue chemotherapy.   History of Present Illness Discussed the use of AI scribe software for clinical note transcription with the patient, who gave verbal consent to proceed.  History of Present Illness Catherine [name] is a very pleasant 89 year old female who is here today  for hospital follow-up and is accompanied by her husband. She reports she is having the most difficulty with managing her medications, taking thirteen to fourteen pills in the morning and two at night, with confusion about dosages and timing. Her daughter and granddaughter assist with medication management. She experiences significant nausea and vomiting after taking  medications and attempts to manage this by spacing them out. She is experiencing increased shortness of breath today with no improvement after using albuterol  nebulizer.  She denies chest pain, orthopnea, PND.  Feels that leg edema is stable.  Legs are not currently wrapped.  She is only able to wear bedroom shoes due to swelling.  She denies significant abdominal swelling.  [name] to see Dr. Timmy for management of myeloma in 2 days.  She reports palliative care is supposed to come to her home, but she has not had a provider to come yet.  ROS: See HPI  Studies Reviewed      No results found for: LIPOA  Risk Assessment/Calculations  CHA2DS2-VASc Score = 6   This indicates a 9.7% annual risk of stroke. The patient's score is based upon: CHF History: 1 HTN History: 1 Diabetes History: 0 Stroke History: 0 Vascular Disease History: 1 Age Score: 2 Gender Score: 1            Physical Exam VS:  BP (!) 115/58 (BP Location: Left Arm, Patient Position: Sitting, Cuff Size: Normal)   Pulse 92   Resp 17   Ht 5' 3 (1.6 m)   Wt 119 lb (54 kg)   SpO2 95%   BMI 21.08 kg/m    Wt Readings from Last 3 Encounters:  06/18/24 119 lb (54 kg)  06/12/24 125 lb (56.7 kg)  06/04/24 139 lb (63 kg)    GEN: Well nourished, well developed in no acute distress NECK: No JVD; No carotid bruits CARDIAC: Irregular RR, no murmurs, rubs, gallops RESPIRATORY:  Clear to auscultation without rales, wheezing or rhonchi  ABDOMEN: Soft, non-tender, non-distended EXTREMITIES:  No edema; No deformity   Assessment & Plan CKD  Stage 3b Chronic HFpEF Dyspnea   Recent admission for volume overload, DOE. Right heart cath 05/17/24 revealed only mildly elevated filling pressures, moderate pulmonary hypertension, and normal cardiac output.  Echo 04/24/24 revealed LVEF 70-75%, indetermant diastolic parameters, mildly reduced RV function.  Since returning home, she reports edema has been stable.  She does not  provide specific weight measurements but does not feel volume overloaded.  She is able to sleep without chest pain, PND or orthopnea.  Chronic bilateral lower extremity swelling that appears stable. No weeping. mproved since hospital discharge and home weight is stable.  Metolazone  is prescribed as 5 mg twice daily on Mondays and Thursdays, however feel this is a misprint. She is unsure of medication and doses and relies on assistance from her daughter.  - Continue daily weight - Take metolazone  5 mg on Mondays and Thursdays 30 minutes before Bumex  - Continue Bumex  2 mg twice daily -Continue potassium chloride  40 mEq daily with an additional 40 mEq on Mondays and Thursdays -Continue albuterol  nebulizer as needed -Good nutrition and hydration emphasized -Continue leg elevation for swelling - Continue GDMT with empagliflozin    Polypharmacy Medication management Nausea/vomiting She feels overwhelmed by pill burden and has asked for our assistance. High risk for readmission.  She reports nausea and vomiting following morning medications.  Medication instructions were provided and we will call her daughter later today for verification. - Recommendation to  discontinue multivitamin and vitamin D  to decrease pill burden - Gave specific instructions on medications to take morning and evening to try to lessen pill burden in the morning - Take Zofran  4 mg as needed every 8 hours for nausea vomiting  Atrial fibrillation on chronic anticoagulation Junctional tachycardia NSVT Pacemaker  Not specifically addressed today. History of VT noted on remote transmission 6/30 and 7/1, > 20 beats. She has been unable to tolerate multiple beta-blockers in varying doses and does not want to add additional medications. Seen by EP 04/12/2024 with device interrogation that revealed junctional tachycardia felt to be contributing to feeling poorly.  Was scheduled for SVT ablation on 05/30/2024, however this was postponed due to  frequent hospitalization.  - Continue Eliquis  2.5 mg twice daily which is appropriate dose (age, weight) for stroke prevention for CHA2DS2-VASc score of 6 -Continue remote device transmissions  S/p MVR/Aortic stenosis Stable mitral valve function on echo completed 04/24/2024. No symptoms to suggest worsening valve function.  -Continue to monitor clinically at this time  Hypotension BP has been stable on midodrine . No presyncope, syncope or falls.  -Continue midodrine  for blood pressures support  CAD S/p CABG x 1 in 2018. She denies chest pain, dyspnea, or other symptoms concerning for angina.  No indication for further ischemic evaluation at this time.  Severe protein-calorie malnutrition Weakness    Multiple myeloma During admission, was found to have nephrotic syndrome leading to proteinuria, decreased albumin  and subsequent leg edema. High risk for readmission. Was diagnosed with multiple myeloma and amyloid nephropathy.  She continues to struggle with appetite.  Is feeling weak with daily activities.  Has appointment with oncology in 2 days. -Emphasis placed on high-protein, high-calorie foods and low sugar, high protein nutritional supplements like Boost or Ensure   Pruritic rash   She has a pruritic rash with erythema and brown spots post-hospitalization.  -Try hydrocortisone  or Benadryl  cream to affected area - Keep the area uncovered or wear loose clothing for air circulation.        Disposition: TBD  Signed, Catherine Bane, NP-C "

## 2024-06-18 ENCOUNTER — Encounter (HOSPITAL_BASED_OUTPATIENT_CLINIC_OR_DEPARTMENT_OTHER): Payer: Self-pay | Admitting: Nurse Practitioner

## 2024-06-18 ENCOUNTER — Ambulatory Visit (HOSPITAL_BASED_OUTPATIENT_CLINIC_OR_DEPARTMENT_OTHER): Admitting: Nurse Practitioner

## 2024-06-18 ENCOUNTER — Telehealth (HOSPITAL_BASED_OUTPATIENT_CLINIC_OR_DEPARTMENT_OTHER): Payer: Self-pay | Admitting: *Deleted

## 2024-06-18 VITALS — BP 115/58 | HR 92 | Resp 17 | Ht 63.0 in | Wt 119.0 lb

## 2024-06-18 DIAGNOSIS — I5032 Chronic diastolic (congestive) heart failure: Secondary | ICD-10-CM | POA: Diagnosis not present

## 2024-06-18 DIAGNOSIS — R531 Weakness: Secondary | ICD-10-CM | POA: Diagnosis not present

## 2024-06-18 DIAGNOSIS — Z952 Presence of prosthetic heart valve: Secondary | ICD-10-CM | POA: Diagnosis not present

## 2024-06-18 DIAGNOSIS — N1832 Chronic kidney disease, stage 3b: Secondary | ICD-10-CM

## 2024-06-18 DIAGNOSIS — Z7901 Long term (current) use of anticoagulants: Secondary | ICD-10-CM | POA: Diagnosis not present

## 2024-06-18 DIAGNOSIS — I4891 Unspecified atrial fibrillation: Secondary | ICD-10-CM | POA: Diagnosis not present

## 2024-06-18 DIAGNOSIS — L282 Other prurigo: Secondary | ICD-10-CM

## 2024-06-18 DIAGNOSIS — Z950 Presence of cardiac pacemaker: Secondary | ICD-10-CM

## 2024-06-18 DIAGNOSIS — R0609 Other forms of dyspnea: Secondary | ICD-10-CM

## 2024-06-18 DIAGNOSIS — E43 Unspecified severe protein-calorie malnutrition: Secondary | ICD-10-CM | POA: Diagnosis not present

## 2024-06-18 DIAGNOSIS — I495 Sick sinus syndrome: Secondary | ICD-10-CM | POA: Diagnosis not present

## 2024-06-18 DIAGNOSIS — I951 Orthostatic hypotension: Secondary | ICD-10-CM | POA: Diagnosis not present

## 2024-06-18 DIAGNOSIS — C9000 Multiple myeloma not having achieved remission: Secondary | ICD-10-CM | POA: Diagnosis not present

## 2024-06-18 DIAGNOSIS — Z79899 Other long term (current) drug therapy: Secondary | ICD-10-CM

## 2024-06-18 DIAGNOSIS — I2510 Atherosclerotic heart disease of native coronary artery without angina pectoris: Secondary | ICD-10-CM

## 2024-06-18 MED ORDER — METOLAZONE 5 MG PO TABS: ORAL_TABLET | ORAL | 0 refills | Status: DC

## 2024-06-18 NOTE — Patient Instructions (Addendum)
 Medication Instructions:   Is she taking Lyrica , Valium , Norco/Vicodin? Those medications were not brought in today.  STOP multivitamin and Vitamin D   Take Zofran  4 mg as needed every 8 hours for nausea/vomiting  DECREASE metolazone  (Zaroxolyn ) to 5 mg ONCE on Monday and Thursday MORNINGS. Take 30 minutes before Bumex  and take additional 40 mEq of potassium chloride .  Take Bumex  2 mg twice daily Take potassium chloride  40 mEq daily, take additional potassium as noted above Take Eliquis  2.5 mg twice daily Take albuterol  inhaler and nebulizer as needed Take Jardiance  10 mg at night Take Zinc  sulfate once daily (morning or night) Take Midodrine  10 mg 3 times daily with meals  *If you need a refill on your cardiac medications before your next appointment, please call your pharmacy*  Lab Work: None Ordered If you have labs (blood work) drawn today and your tests are completely normal, you will receive your results only by: MyChart Message (if you have MyChart) OR A paper copy in the mail If you have any lab test that is abnormal or we need to change your treatment, we will call you to review the results.  Testing/Procedures: None Ordered  Follow-Up: At [facility], you and your health needs are our priority.  As part of our continuing mission to provide you with exceptional heart care, our providers are all part of one team.  This team includes your primary Cardiologist (physician) and Advanced Practice Providers or APPs (Physician Assistants and Nurse Practitioners) who all work together to provide you with the care you need, when you need it.  Your next appointment:   8 week(s)  Provider:   Rosaline Percy PIETY  We recommend signing up for the patient portal called MyChart.  Sign up information is provided on this After Visit Summary.  MyChart is used to connect with patients for Virtual Visits (Telemedicine).  Patients are able to view lab/test results, encounter  notes, upcoming appointments, etc.  Non-urgent messages can be sent to your provider as well.   To learn more about what you can do with MyChart, go to [email or web address].   Other Instructions  I will call your house today @ 4;15 - 4:30 to discuss medications with your daughter.

## 2024-06-18 NOTE — Telephone Encounter (Signed)
 S/w pt's daughter to work on getting medications straight.  Rosaline is now in conversation with daughter. Medication list updated.

## 2024-06-19 ENCOUNTER — Telehealth: Payer: Self-pay | Admitting: *Deleted

## 2024-06-19 ENCOUNTER — Other Ambulatory Visit: Payer: Self-pay | Admitting: *Deleted

## 2024-06-19 DIAGNOSIS — E854 Organ-limited amyloidosis: Secondary | ICD-10-CM

## 2024-06-19 MED ORDER — ONDANSETRON HCL 8 MG PO TABS: ORAL_TABLET | ORAL | 1 refills | Status: DC

## 2024-06-19 MED ORDER — PROCHLORPERAZINE MALEATE 10 MG PO TABS: 10.0000 mg | ORAL_TABLET | Freq: Four times a day (QID) | ORAL | 1 refills | Status: DC | PRN

## 2024-06-19 NOTE — Telephone Encounter (Signed)
 Copied from CRM #[id number]. Topic: General - Other >> Jun 19, 2024 11:29 AM Chiquita SQUIBB wrote: Reason for CRM: Kristen from [facility] is calling in to let the doctor know they are following the patient for palliative care. If the doctor has any questions their call back number is [phone]

## 2024-06-20 ENCOUNTER — Inpatient Hospital Stay

## 2024-06-20 ENCOUNTER — Inpatient Hospital Stay: Admitting: Licensed Clinical Social Worker

## 2024-06-20 ENCOUNTER — Encounter: Payer: Self-pay | Admitting: Hematology & Oncology

## 2024-06-20 ENCOUNTER — Inpatient Hospital Stay (HOSPITAL_BASED_OUTPATIENT_CLINIC_OR_DEPARTMENT_OTHER): Admitting: Hematology & Oncology

## 2024-06-20 ENCOUNTER — Inpatient Hospital Stay: Attending: Hematology & Oncology

## 2024-06-20 ENCOUNTER — Other Ambulatory Visit: Payer: Self-pay | Admitting: *Deleted

## 2024-06-20 ENCOUNTER — Encounter: Payer: Self-pay | Admitting: *Deleted

## 2024-06-20 VITALS — BP 122/68 | HR 82 | Resp 19

## 2024-06-20 VITALS — BP 109/48 | HR 59 | Temp 98.8°F | Resp 20 | Ht 63.0 in

## 2024-06-20 DIAGNOSIS — E854 Organ-limited amyloidosis: Secondary | ICD-10-CM

## 2024-06-20 DIAGNOSIS — E8581 Light chain (AL) amyloidosis: Secondary | ICD-10-CM

## 2024-06-20 DIAGNOSIS — C9000 Multiple myeloma not having achieved remission: Secondary | ICD-10-CM | POA: Insufficient documentation

## 2024-06-20 DIAGNOSIS — Z79899 Other long term (current) drug therapy: Secondary | ICD-10-CM | POA: Insufficient documentation

## 2024-06-20 DIAGNOSIS — Z5112 Encounter for antineoplastic immunotherapy: Secondary | ICD-10-CM | POA: Insufficient documentation

## 2024-06-20 DIAGNOSIS — Z79620 Long term (current) use of immunosuppressive biologic: Secondary | ICD-10-CM | POA: Diagnosis not present

## 2024-06-20 LAB — CBC WITH DIFFERENTIAL (CANCER CENTER ONLY)
Abs Immature Granulocytes: 0.04 K/uL (ref 0.00–0.07)
Basophils Absolute: 0.1 K/uL (ref 0.0–0.1)
Basophils Relative: 1 %
Eosinophils Absolute: 0.4 K/uL (ref 0.0–0.5)
Eosinophils Relative: 4 %
HCT: 36.7 % (ref 36.0–46.0)
Hemoglobin: 12.2 g/dL (ref 12.0–15.0)
Immature Granulocytes: 0 %
Lymphocytes Relative: 8 %
Lymphs Abs: 0.8 K/uL (ref 0.7–4.0)
MCH: 30.2 pg (ref 26.0–34.0)
MCHC: 33.2 g/dL (ref 30.0–36.0)
MCV: 90.8 fL (ref 80.0–100.0)
Monocytes Absolute: 0.6 K/uL (ref 0.1–1.0)
Monocytes Relative: 6 %
Neutro Abs: 8.2 K/uL — ABNORMAL HIGH (ref 1.7–7.7)
Neutrophils Relative %: 81 %
Platelet Count: 174 K/uL (ref 150–400)
RBC: 4.04 MIL/uL (ref 3.87–5.11)
RDW: 13.3 % (ref 11.5–15.5)
WBC Count: 10.1 K/uL (ref 4.0–10.5)
nRBC: 0 % (ref 0.0–0.2)

## 2024-06-20 LAB — CMP (CANCER CENTER ONLY)
ALT: 14 U/L (ref 0–44)
AST: 38 U/L (ref 15–41)
Albumin: 2.8 g/dL — ABNORMAL LOW (ref 3.5–5.0)
Alkaline Phosphatase: 103 U/L (ref 38–126)
Anion gap: 14 (ref 5–15)
BUN: 47 mg/dL — ABNORMAL HIGH (ref 8–23)
CO2: 30 mmol/L (ref 22–32)
Calcium: 9.1 mg/dL (ref 8.9–10.3)
Chloride: 95 mmol/L — ABNORMAL LOW (ref 98–111)
Creatinine: 2.4 mg/dL — ABNORMAL HIGH (ref 0.44–1.00)
GFR, Estimated: 19 mL/min — ABNORMAL LOW (ref >60)
Glucose, Bld: 121 mg/dL — ABNORMAL HIGH (ref 70–99)
Potassium: 3.1 mmol/L — ABNORMAL LOW (ref 3.5–5.1)
Sodium: 139 mmol/L (ref 135–145)
Total Bilirubin: 0.3 mg/dL (ref 0.0–1.2)
Total Protein: 6.3 g/dL — ABNORMAL LOW (ref 6.5–8.1)

## 2024-06-20 LAB — TYPE AND SCREEN
ABO/RH(D): O POS
Antibody Screen: NEGATIVE

## 2024-06-20 LAB — PRETREATMENT RBC PHENOTYPE: DAT, IgG: NEGATIVE

## 2024-06-20 LAB — LACTATE DEHYDROGENASE: LDH: 527 U/L — ABNORMAL HIGH (ref 98–192)

## 2024-06-20 MED ORDER — ACETAMINOPHEN 325 MG PO TABS
650.0000 mg | ORAL_TABLET | Freq: Once | ORAL | Status: AC
  Administered 2024-06-20: 650 mg via ORAL
  Filled 2024-06-20: qty 2

## 2024-06-20 MED ORDER — FLUDROCORTISONE ACETATE 0.1 MG PO TABS: 0.2000 mg | ORAL_TABLET | Freq: Every day | ORAL | 5 refills | Status: DC

## 2024-06-20 MED ORDER — DARATUMUMAB-HYALURONIDASE-FIHJ 1800-30000 MG-UT/15ML ~~LOC~~ SOLN
1800.0000 mg | Freq: Once | SUBCUTANEOUS | Status: AC
  Administered 2024-06-20: 1800 mg via SUBCUTANEOUS
  Filled 2024-06-20: qty 15

## 2024-06-20 MED ORDER — DEXAMETHASONE 4 MG PO TABS: 20.0000 mg | ORAL_TABLET | Freq: Once | ORAL | Status: DC

## 2024-06-20 MED ORDER — DEXAMETHASONE 4 MG PO TABS
40.0000 mg | ORAL_TABLET | Freq: Once | ORAL | Status: AC
  Administered 2024-06-20: 20 mg via ORAL
  Filled 2024-06-20: qty 10

## 2024-06-20 MED ORDER — FAMCICLOVIR 250 MG PO TABS: 250.0000 mg | ORAL_TABLET | Freq: Every day | ORAL | 6 refills | Status: DC

## 2024-06-20 MED ORDER — LIDOCAINE 5 % EX PTCH: 1.0000 | MEDICATED_PATCH | CUTANEOUS | 5 refills | Status: DC

## 2024-06-20 MED ORDER — DIPHENHYDRAMINE HCL 25 MG PO CAPS
50.0000 mg | ORAL_CAPSULE | Freq: Once | ORAL | Status: AC
  Administered 2024-06-20: 50 mg via ORAL
  Filled 2024-06-20: qty 2

## 2024-06-20 MED ORDER — MONTELUKAST SODIUM 10 MG PO TABS
10.0000 mg | ORAL_TABLET | Freq: Once | ORAL | Status: AC
  Administered 2024-06-20: 10 mg via ORAL
  Filled 2024-06-20: qty 1

## 2024-06-20 MED ORDER — BORTEZOMIB CHEMO SQ INJECTION 3.5 MG (2.5MG/ML)
1.0400 mg/m2 | Freq: Once | INTRAMUSCULAR | Status: AC
  Administered 2024-06-20: 1.75 mg via SUBCUTANEOUS
  Filled 2024-06-20: qty 0.7

## 2024-06-20 NOTE — Progress Notes (Signed)
 [facility] Clinical Social Work  Clinical Social Work was referred by Statistician for assessment of psychosocial needs.  Clinical Social Worker met with patient and her daughter briefly to offer support and assess for needs.     Interventions: Provided patient with information about CSW role.  Patient lives with her husband.  She complained of continuous nausea.  Expressed no needs at this time.       Follow Up Plan:  CSW will follow-up with patient by phone     Macario CHRISTELLA Au, LCSW  Clinical Social Worker [facility]

## 2024-06-20 NOTE — Progress Notes (Signed)
 Serum creatinine 2.64 .  Ok to treat per [employer]

## 2024-06-20 NOTE — Addendum Note (Signed)
 Addended by: FRANKY NORLEEN BROCKS on: 06/20/2024 04:11 PM   Modules accepted: Orders

## 2024-06-20 NOTE — Progress Notes (Signed)
 Hematology and Oncology Follow Up Visit  Catherine [name] [id number] [birth year] 89 y.o. 06/20/2024   Principle Diagnosis:  Kappa light chain myeloma Possible cardiac amyloidosis  Current Therapy:   Faspro/Velcade -   start cycle 1 on  06/20/2024     Interim History:  Catherine [name] is back for her first office visit.  I have not seen her in the hospital.  She has a diagnosis of myeloma.  She has kappa light chain myeloma by her bone marrow biopsy.  She had a normal chromosomes.  She had a negative FISH panel.  She may have cardiac amyloid.  She has had incredibly high pro-BNP.  When this is checked in early September it was almost 29,000.  She had a 24-hour urine that was done which showed 2573 mg/L of Kappa light chain.  She had quite a of the total protein in the urine.  Her problem is that she just has problems with heart failure.  I am not sure that we can be able to improve the heart failure but we can certainly try.  She has had blood pressure issues.  She really cannot tolerate midodrine  that she was given.  She has had no problems with vomiting.  She has little bit of nausea.  She has had no bleeding.  She has had a little bit of leg swelling.  Her appetite is marginal.  Overall, I would have to say that her performance status is probably ECOG 2-3.    Medications:  Current Outpatient Medications:    albuterol  (PROVENTIL ) (2.5 MG/3ML) 0.083% nebulizer solution, Take 3 mLs (2.5 mg total) by nebulization every 6 (six) hours as needed (during COPD Exacerbation)., Disp: 75 mL, Rfl: 2   albuterol  (VENTOLIN  HFA) 108 (90 Base) MCG/ACT inhaler, INHALE 2 PUFFS INTO THE LUNGS EVERY 4 HOURS AS NEEDED FOR AHEEZING OR SHORTNESS OF BREATH, Disp: 8.5 g, Rfl: 2   apixaban  (ELIQUIS ) 2.5 MG TABS tablet, TAKE ONE TABLET TWICE DAILY (START 09/18/23 OR AS DIRECTED), Disp: 60 tablet, Rfl: 6   bumetanide  (BUMEX ) 1 MG tablet, Take 2 tablets (2 mg total) by mouth daily. (Patient taking differently:  Take 2 mg by mouth 2 (two) times daily.), Disp: 60 tablet, Rfl: 0   diazepam  (VALIUM ) 5 MG tablet, TAKE ONE TABLET BY MOUTH AT BEDTIME, Disp: 30 tablet, Rfl: 5   empagliflozin  (JARDIANCE ) 10 MG TABS tablet, Take 1 tablet (10 mg total) by mouth daily., Disp: 90 tablet, Rfl: 3   feeding supplement (ENSURE PLUS HIGH PROTEIN) LIQD, Take 237 mLs by mouth 2 (two) times daily between meals., Disp: 14220 mL, Rfl: 0   guaiFENesin  (MUCINEX ) 600 MG 12 hr tablet, Take 1 tablet (600 mg total) by mouth 2 (two) times daily as needed for to loosen phlegm or cough., Disp: 60 tablet, Rfl: 0   HYDROcodone -acetaminophen  (NORCO/VICODIN) 5-325 MG tablet, TAKE ONE TABLET EVERY 8 HOURS AS NEEDED FOR PAIN, Disp: 90 tablet, Rfl: 0   metolazone  (ZAROXOLYN ) 5 MG tablet, Take metolazone  in the morning on Mondays and Thursdays, take extra potassium 40 mEq with metolazone , Disp: 20 tablet, Rfl: 0   midodrine  (PROAMATINE ) 10 MG tablet, Take 1 tablet (10 mg total) by mouth 3 (three) times daily with meals., Disp: 90 tablet, Rfl: 0   ondansetron  (ZOFRAN ) 4 MG tablet, Take 1 tablet (4 mg total) by mouth every 8 (eight) hours as needed for nausea or vomiting., Disp: 20 tablet, Rfl: 0   potassium chloride  SA (KLOR-CON  M) 20 MEQ tablet, Take potassium 40  mEq daily, take extra 40 EQ of potassium on the days you take metolazone , Disp: 80 tablet, Rfl: 0   Zinc  Sulfate 220 (50 Zn) MG TABS, Take 1 tablet (220 mg total) by mouth daily., Disp: 30 tablet, Rfl: 0   acetaminophen  (TYLENOL ) 500 MG tablet, Take 500 mg by mouth every 6 (six) hours as needed for mild pain (pain score 1-3). (Patient not taking: Reported on 06/20/2024), Disp: , Rfl:    ondansetron  (ZOFRAN ) 8 MG tablet, Take 8 mg by mouth 30 to 60 min prior to Cyclophosphamide administration then take 8 mg every 8 hrs as needed for nausea and vomiting. (Patient not taking: Reported on 06/20/2024), Disp: 30 tablet, Rfl: 1   prochlorperazine  (COMPAZINE ) 10 MG tablet, Take 1 tablet (10 mg total)  by mouth every 6 (six) hours as needed for nausea or vomiting. (Patient not taking: Reported on 06/20/2024), Disp: 30 tablet, Rfl: 1 No current facility-administered medications for this visit.  Facility-Administered Medications Ordered in Other Visits:    acetaminophen  (TYLENOL ) tablet 650 mg, 650 mg, Oral, Once, Samera Macy R, MD   bortezomib  SQ (VELCADE ) chemo injection (2.5mg /mL concentration) 1.75 mg, 1.04 mg/m2 (Treatment Plan Recorded), Subcutaneous, Once, Mazey Mantell, Maude SAUNDERS, MD   daratumumab -hyaluronidase -fihj (DARZALEX  FASPRO) 1800-30000 MG-UT/15ML chemo SQ injection 1,800 mg, 1,800 mg, Subcutaneous, Once, Khayree Delellis, Maude SAUNDERS, MD   dexamethasone  (DECADRON ) tablet 40 mg, 40 mg, Oral, Once, Shardae Kleinman, Maude SAUNDERS, MD   diphenhydrAMINE  (BENADRYL ) capsule 50 mg, 50 mg, Oral, Once, Davi Rotan, Maude SAUNDERS, MD   montelukast  (SINGULAIR ) tablet 10 mg, 10 mg, Oral, Once, Timera Windt, Maude SAUNDERS, MD  Allergies:  Allergies  Allergen Reactions   Aldactone  [Spironolactone ] Other (See Comments)    Dyspnea    Amoxil  [Amoxicillin ] Palpitations and Other (See Comments)    Tachycardia   Zebeta  [Bisoprolol ] Shortness Of Breath   Calan  Fatos.Flood ] Other (See Comments)    Myalgias    Crestor  [Rosuvastatin ] Other (See Comments)    Myalgias    Flovent  [Fluticasone  Propionate] Other (See Comments)    Leg cramps   Livalo  [Pitavastatin ] Other (See Comments)    Myalgias    Lyrica  [Pregabalin ] Swelling   Neurontin [Gabapentin] Swelling   Norvasc  [Amlodipine ] Swelling    Low extremity edema   Zetia  [Ezetimibe ] Other (See Comments)    Myalgias   Zocor  [Simvastatin ] Other (See Comments)    Myalgias    Lotensin [Benazepril] Cough   Polytrim  [Polymyxin B -Trimethoprim ] Swelling    Past Medical History, Surgical history, Social history, and Family History were reviewed and updated.  Review of Systems:  Review of Systems  Constitutional:  Positive for malaise/fatigue.  HENT: Negative.    Eyes: Negative.   Respiratory:   Positive for shortness of breath.   Cardiovascular:  Positive for palpitations and orthopnea.  Gastrointestinal:  Positive for nausea and vomiting.  Genitourinary:  Positive for urgency.  Musculoskeletal:  Positive for joint pain and myalgias.  Skin:  Positive for rash.  Neurological:  Positive for weakness.  Endo/Heme/Allergies: Negative.   Psychiatric/Behavioral:  Positive for depression.      Physical Exam:  height is 5' 3 (1.6 m). Her oral temperature is 98.8 F (37.1 C). Her blood pressure is 109/48 (abnormal) and her pulse is 59 (abnormal). Her respiration is 20 and oxygen  saturation is 95%.   Wt Readings from Last 3 Encounters:  06/18/24 119 lb (54 kg)  06/12/24 125 lb (56.7 kg)  06/04/24 139 lb (63 kg)   Physical Exam Vitals reviewed.  Constitutional:  Comments: This is a elderly, somewhat chronically ill-appearing white female.  She is thin.  She has scattered ecchymoses.  She has a lot of sun damage skin.  HENT:     Head: Normocephalic and atraumatic.  Eyes:     Pupils: Pupils are equal, round, and reactive to light.  Cardiovascular:     Rate and Rhythm: Normal rate and regular rhythm.     Heart sounds: Normal heart sounds.     Comments: Cardiac exam is regular rate and rhythm.  She has a 2/6 systolic ejection murmur. Pulmonary:     Effort: Pulmonary effort is normal.     Breath sounds: Normal breath sounds.     Comments: Lungs sound relatively clear bilaterally.  She may have some wheezing and rhonchi at the bases. Abdominal:     General: Bowel sounds are normal.     Palpations: Abdomen is soft.  Musculoskeletal:        General: No tenderness or deformity. Normal range of motion.     Cervical back: Normal range of motion.  Lymphadenopathy:     Cervical: No cervical adenopathy.  Skin:    General: Skin is warm and dry.     Findings: No erythema or rash.  Neurological:     Mental Status: She is alert and oriented to person, place, and time.  Psychiatric:         Behavior: Behavior normal.        Thought Content: Thought content normal.        Judgment: Judgment normal.      Lab Results  Component Value Date   WBC 10.1 06/20/2024   HGB 12.2 06/20/2024   HCT 36.7 06/20/2024   MCV 90.8 06/20/2024   PLT 174 06/20/2024     Chemistry      Component Value Date/Time   NA 139 06/20/2024 1052   NA 138 04/12/2024 0837   K 3.1 (L) 06/20/2024 1052   CL 95 (L) 06/20/2024 1052   CO2 30 06/20/2024 1052   BUN 47 (H) 06/20/2024 1052   BUN 24 04/12/2024 0837   CREATININE 2.40 (H) 06/20/2024 1052   CREATININE 0.94 (H) 07/29/2020 0950      Component Value Date/Time   CALCIUM  9.1 06/20/2024 1052   ALKPHOS 103 06/20/2024 1052   AST 38 06/20/2024 1052   ALT 14 06/20/2024 1052   BILITOT 0.3 06/20/2024 1052      Impression and Plan: Ms. Furgerson is a very charming 89 year old white female.  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  I am very impressed by this.  They are very well-known.  She has cardiac amyloid all likelihood.  I know she has light chain disease.  She has incredibly high level of kappa light chain.  I think that treatment will be able to help.  However, I am not sure that treatment is going to help with the cardiac function.  I know that she has her issues.  Hopefully, we can try to keep her out of the hospital with treatment.  Hopefully, she will not get sick with treatment.  I would think that she would not.  We are going to have to watch her closely.  Her protocol is that she will come in weekly for her treatments.  Hopefully, we can start to see her the light chain start coming down.  Maybe, this will help with her fatigue and weakness.  Maybe will help improve her appetite.  Maybe it will  help improve her cardiac function.   Maude JONELLE Crease, MD 9/24/202512:49 PM

## 2024-06-20 NOTE — Telephone Encounter (Signed)
 Noted.  Catherine Covey MD [facility] at [facility]

## 2024-06-20 NOTE — Telephone Encounter (Signed)
 Makiya called in from missing a referral call yesterday by Ginnie. Per note left on my desk from me being out yesterday, note by Ginnie stated ok to put pt in on Wed. 10/22/2020 at 12:00

## 2024-06-20 NOTE — Progress Notes (Unsigned)
 Patient here in infusion room with daughter after exam with dR Ennever.  Discussed side effects of Velcade , and Faspro                   which include but are not limited to myelosuppression, decreased appetite, fatigue, fever, allergic or infusional reaction, mucositis, cardiac toxicity, cough, SOB, altered taste, nausea and vomiting, diarrhea, constipation, elevated LFTs myalgia and arthralgias, hair loss or thinning, rash, skin dryness, nail changes, peripheral neuropathy, delayed wound healing, mental changes (Chemo brain), increased risk of infections, weight loss.  Reviewed infusion room and office policy and procedure and phone numbers 24 hours x 7 days a week.  Reviewed when to call the office with any concerns or problems.  Transport planner given.   Antiemetic protocol and chemotherapy schedule reviewed. Patient verbalized understanding of chemotherapy indications and possible side effects.  Teachback done

## 2024-06-20 NOTE — Progress Notes (Unsigned)
 Initial RN Navigator Patient Visit  Name: Catherine [name] Diagnosis: Multiple Myeloma  Met with patient prior to their visit with MD. Twyla patient Your Patient Navigator handout which explains my role, areas in which I am able to help, and all the contact information for myself and the office. Also gave patient MD and Navigator business card. Reviewed with patient the general overview of expected course after initial diagnosis and time frame for all steps to be completed.  New patient packet given to patient which includes: orientation to office and staff; campus directory; education on My Chart and Advance Directives; and patient centered education on multiple myeloma.   She comes in with her daughter, Catherine [name]. Catherine [name] lives about 5 minutes from her mom. Patient lives with her husband. He does fulfill a caregiver role.   Referral placed to nutrition and social work per protocol.   Patient feels very poorly today. She has nausea. She is weak. We discussed how she may not feel better until the MM is treated and treatment could be very beneficial. Her and her daughter asked questions about treatment and med management, time expectations, and cycle info. Answered all to their satisfaction.   Patient completed visit with Dr. Timmy.  Patient understands all follow up procedures and expectations. They have my number to reach out for any further clarification or additional needs.

## 2024-06-20 NOTE — Patient Instructions (Signed)
 [facility] - A DEPT OF [facility]  Discharge Instructions: Thank you for choosing [facility] to provide your oncology and hematology care.   If you have a lab appointment with the Cancer Center, please go directly to the Cancer Center and check in at the registration area.  Wear comfortable clothing and clothing appropriate for easy access to any Portacath or PICC line.   We strive to give you quality time with your provider. You may need to reschedule your appointment if you arrive late (15 or more minutes).  Arriving late affects you and other patients whose appointments are after yours.  Also, if you miss three or more appointments without notifying the office, you may be dismissed from the clinic at the provider's discretion.      For prescription refill requests, have your pharmacy contact our office and allow 72 hours for refills to be completed.    Today you received the following chemotherapy and/or immunotherapy agents: Velcade and Faspro      To help prevent nausea and vomiting after your treatment, we encourage you to take your nausea medication as directed.  BELOW ARE SYMPTOMS THAT SHOULD BE REPORTED IMMEDIATELY: *FEVER GREATER THAN 100.4 F (38 C) OR HIGHER *CHILLS OR SWEATING *NAUSEA AND VOMITING THAT IS NOT CONTROLLED WITH YOUR NAUSEA MEDICATION *UNUSUAL SHORTNESS OF BREATH *UNUSUAL BRUISING OR BLEEDING *URINARY PROBLEMS (pain or burning when urinating, or frequent urination) *BOWEL PROBLEMS (unusual diarrhea, constipation, pain near the anus) TENDERNESS IN MOUTH AND THROAT WITH OR WITHOUT PRESENCE OF ULCERS (sore throat, sores in mouth, or a toothache) UNUSUAL RASH, SWELLING OR PAIN  UNUSUAL VAGINAL DISCHARGE OR ITCHING   Items with * indicate a potential emergency and should be followed up as soon as possible or go to the Emergency Department if any problems should occur.  Please show the CHEMOTHERAPY ALERT CARD or  IMMUNOTHERAPY ALERT CARD at check-in to the Emergency Department and triage nurse. Should you have questions after your visit or need to cancel or reschedule your appointment, please contact [facility] - A DEPT OF [facility]  [phone] and follow the prompts.  Office hours are 8:00 a.m. to 4:30 p.m. Monday - Friday. Please note that voicemails left after 4:00 p.m. may not be returned until the following business day.  We are closed weekends and major holidays. You have access to a nurse at all times for urgent questions. Please call the main number to the clinic [phone] and follow the prompts.  For any non-urgent questions, you may also contact your provider using MyChart. We now offer e-Visits for anyone 57 and older to request care online for non-urgent symptoms. For details visit [email or web address].   Also download the MyChart app! Go to the app store, search "MyChart", open the app, select [facility], and log in with your MyChart username and password.

## 2024-06-21 ENCOUNTER — Telehealth: Payer: Self-pay | Admitting: *Deleted

## 2024-06-21 ENCOUNTER — Other Ambulatory Visit: Payer: Self-pay | Admitting: Family Medicine

## 2024-06-21 LAB — IGG, IGA, IGM
IgA: 354 mg/dL (ref 64–422)
IgG (Immunoglobin G), Serum: 679 mg/dL (ref 586–1602)
IgM (Immunoglobulin M), Srm: 43 mg/dL (ref 26–217)

## 2024-06-21 LAB — KAPPA/LAMBDA LIGHT CHAINS
Kappa free light chain: 1868.9 mg/L — ABNORMAL HIGH (ref 3.3–19.4)
Kappa, lambda light chain ratio: 37.68 — ABNORMAL HIGH (ref 0.26–1.65)
Lambda free light chains: 49.6 mg/L — ABNORMAL HIGH (ref 5.7–26.3)

## 2024-06-21 NOTE — Telephone Encounter (Signed)
 Chemo follow up call with Daughter Mliss

## 2024-06-22 ENCOUNTER — Ambulatory Visit (INDEPENDENT_AMBULATORY_CARE_PROVIDER_SITE_OTHER): Payer: Medicare Other

## 2024-06-22 ENCOUNTER — Encounter: Payer: Self-pay | Admitting: Hematology & Oncology

## 2024-06-22 ENCOUNTER — Telehealth: Payer: Self-pay | Admitting: *Deleted

## 2024-06-22 ENCOUNTER — Encounter: Payer: Self-pay | Admitting: Cardiovascular Disease

## 2024-06-22 DIAGNOSIS — I495 Sick sinus syndrome: Secondary | ICD-10-CM

## 2024-06-22 NOTE — Telephone Encounter (Signed)
 Received call from Catherine [name] patients daughter stating that her mom called her this morning stating that she is having trouble breathing and feels like each breath could be her last.  Talked to Dr Timmy.  Patient is to go to ED as soon as possible.  Catherine notified.  Agreed she will take her mom to the ED.

## 2024-06-23 LAB — CUP PACEART REMOTE DEVICE CHECK
Battery Remaining Longevity: 101 mo
Battery Voltage: 3.04 V
Brady Statistic AP VP Percent: 83.55 %
Brady Statistic AP VS Percent: 1.68 %
Brady Statistic AS VP Percent: 2.61 %
Brady Statistic AS VS Percent: 12.17 %
Brady Statistic RA Percent Paced: 94.54 %
Brady Statistic RV Percent Paced: 86.16 %
Date Time Interrogation Session: 20250925205031
Implantable Lead Connection Status: 753985
Implantable Lead Connection Status: 753985
Implantable Lead Implant Date: 20241226
Implantable Lead Implant Date: 20241226
Implantable Lead Location: 753859
Implantable Lead Location: 753860
Implantable Lead Model: 3830
Implantable Lead Model: 4574
Implantable Pulse Generator Implant Date: 20241226
Lead Channel Impedance Value: 323 Ohm
Lead Channel Impedance Value: 342 Ohm
Lead Channel Impedance Value: 475 Ohm
Lead Channel Impedance Value: 494 Ohm
Lead Channel Pacing Threshold Amplitude: 0.5 V
Lead Channel Pacing Threshold Amplitude: 1.125 V
Lead Channel Pacing Threshold Pulse Width: 0.4 ms
Lead Channel Pacing Threshold Pulse Width: 0.4 ms
Lead Channel Sensing Intrinsic Amplitude: 0.125 mV
Lead Channel Sensing Intrinsic Amplitude: 0.125 mV
Lead Channel Sensing Intrinsic Amplitude: 7.875 mV
Lead Channel Sensing Intrinsic Amplitude: 7.875 mV
Lead Channel Setting Pacing Amplitude: 2.5 V
Lead Channel Setting Pacing Amplitude: 3 V
Lead Channel Setting Pacing Pulse Width: 0.4 ms
Lead Channel Setting Sensing Sensitivity: 1.2 mV
Zone Setting Status: 755011
Zone Setting Status: 755011

## 2024-06-24 ENCOUNTER — Encounter: Payer: Self-pay | Admitting: Family Medicine

## 2024-06-25 ENCOUNTER — Ambulatory Visit: Admitting: Family Medicine

## 2024-06-25 ENCOUNTER — Telehealth: Payer: Self-pay | Admitting: *Deleted

## 2024-06-25 ENCOUNTER — Encounter: Payer: Self-pay | Admitting: Family Medicine

## 2024-06-25 ENCOUNTER — Ambulatory Visit: Payer: Self-pay

## 2024-06-25 LAB — IMMUNOFIXATION REFLEX, SERUM
IgA: 348 mg/dL (ref 64–422)
IgG (Immunoglobin G), Serum: 646 mg/dL (ref 586–1602)
IgM (Immunoglobulin M), Srm: 35 mg/dL (ref 26–217)

## 2024-06-25 LAB — PROTEIN ELECTROPHORESIS, SERUM, WITH REFLEX
A/G Ratio: 0.7 (ref 0.7–1.7)
Albumin ELP: 2.2 g/dL — ABNORMAL LOW (ref 2.9–4.4)
Alpha-1-Globulin: 0.4 g/dL (ref 0.0–0.4)
Alpha-2-Globulin: 1.2 g/dL — ABNORMAL HIGH (ref 0.4–1.0)
Beta Globulin: 1.1 g/dL (ref 0.7–1.3)
Gamma Globulin: 0.7 g/dL (ref 0.4–1.8)
Globulin, Total: 3.3 g/dL (ref 2.2–3.9)
M-Spike, %: 0.1 g/dL — ABNORMAL HIGH
SPEP Interpretation: 0
Total Protein ELP: 5.5 g/dL — ABNORMAL LOW (ref 6.0–8.5)

## 2024-06-25 NOTE — Telephone Encounter (Signed)
 FYI Only or Action Required?: FYI only for provider.  Patient was last seen in primary care on 06/01/2024 by Micheal Wolm ORN, MD.  Called Nurse Triage reporting Shortness of Breath.  Symptoms began several months ago.  Interventions attempted: Other: pt's grandson brought O2 for pt to use temporarily and she noted improvement in symptoms, SpO2 remains at 93%, reading 98% on O2.  Symptoms are: gradually worsening.  Triage Disposition: See HCP Within 4 Hours (Or PCP Triage)  Patient/caregiver understands and will follow disposition?: Yes         Copied from CRM #[id number]. Topic: Clinical - Red Word Triage >> Jun 25, 2024  8:36 AM Catherine [name] wrote: Red Word that prompted transfer to Nurse Triage: Patient is having trouble breathing-called EMS yesterday. Started cancer treatment last Wednesday for multiple myeloma but decided she will not do any further treatment. Pt has CHF and COPD. Reason for Disposition  [1] Longstanding difficulty breathing (e.g., CHF, COPD, emphysema) AND [2] WORSE than normal  Answer Assessment - Initial Assessment Questions 1. RESPIRATORY STATUS: Describe your breathing? (e.g., wheezing, shortness of breath, unable to speak, severe coughing)      SOB 2. ONSET: When did this breathing problem begin?      Ongoing 3. PATTERN Does the difficult breathing come and go, or has it been constant since it started?      Intermittent, worsening at times especially with exertion 4. SEVERITY: How bad is your breathing? (e.g., mild, moderate, severe)      Moderate to severe at timesr 5. RECURRENT SYMPTOM: Have you had difficulty breathing before? If Yes, ask: When was the last time? and What happened that time?      Yes 6. CARDIAC HISTORY: Do you have any history of heart disease? (e.g., heart attack, angina, bypass surgery, angioplasty)      CHF 7. LUNG HISTORY: Do you have any history of lung disease?  (e.g., pulmonary embolus, asthma, emphysema)      COPD 8. CAUSE: What do you think is causing the breathing problem?      Worsening disease process, pt reports she would like to pursue palliative care as she wants no further treatment 9. OTHER SYMPTOMS: Do you have any other symptoms? (e.g., chest pain, cough, dizziness, fever, runny nose)     Cough 10. O2 SATURATION MONITOR:  Do you use an oxygen  saturation monitor (pulse oximeter) at home? If Yes, ask: What is your reading (oxygen  level) today? What is your usual oxygen  saturation reading? (e.g., 95%)       93%  Protocols used: Breathing Difficulty-A-AH

## 2024-06-25 NOTE — Telephone Encounter (Signed)
 Noted patient has appt today with Philippe slade, NP

## 2024-06-25 NOTE — Telephone Encounter (Signed)
 Call received from patient's daughter, Mliss to inform Dr. Timmy that pt refused to go to the ER on Friday, 06/22/24 for SOB and that pt would like to stop treatments.  Mliss states that pt has remained SOB and has a cough that is not productive, but that she does have a throat full of phlegm. Dr. Timmy notified. Mliss notified that Dr. Timmy would like for pt to come in today for a CXR and to be seen.  Mliss requests that this RN call pt and speak to her about this.  Call then placed to pt to inform her that Dr. Timmy would like for her to come in for a CXR and to be seen. Pt states that she does not want to come in today and will wait to see her PCP, Dr. Micheal at her scheduled appt tomorrow at 3:15PM.  Call then placed back to patient's daughter, Mliss to inform her of above.  Request made to Mliss to keep this office posted after pt sees Dr. Micheal tomorrow.  Mliss states that she will keep us  posted, requests that this weeks appt be canceled and is appreciative of assistance.

## 2024-06-25 NOTE — Telephone Encounter (Signed)
 Call received from patient's daughter, Mliss to inform Dr. Timmy that pt would like to cancel all of her appts at this office and will call back to reschedule once she feels better. Dr. Timmy notified.

## 2024-06-26 ENCOUNTER — Encounter: Payer: Self-pay | Admitting: Family Medicine

## 2024-06-26 ENCOUNTER — Telehealth: Payer: Self-pay | Admitting: *Deleted

## 2024-06-26 ENCOUNTER — Emergency Department (HOSPITAL_BASED_OUTPATIENT_CLINIC_OR_DEPARTMENT_OTHER): Admitting: Radiology

## 2024-06-26 ENCOUNTER — Other Ambulatory Visit: Payer: Self-pay

## 2024-06-26 ENCOUNTER — Emergency Department (HOSPITAL_BASED_OUTPATIENT_CLINIC_OR_DEPARTMENT_OTHER)
Admission: EM | Admit: 2024-06-26 | Discharge: 2024-06-26 | Disposition: A | Discharge status: Home / Self Care | Source: Ambulatory Visit | Attending: Emergency Medicine | Admitting: Emergency Medicine

## 2024-06-26 ENCOUNTER — Inpatient Hospital Stay: Admitting: Family Medicine

## 2024-06-26 DIAGNOSIS — I5033 Acute on chronic diastolic (congestive) heart failure: Secondary | ICD-10-CM | POA: Diagnosis not present

## 2024-06-26 DIAGNOSIS — I509 Heart failure, unspecified: Secondary | ICD-10-CM

## 2024-06-26 DIAGNOSIS — Z8579 Personal history of other malignant neoplasms of lymphoid, hematopoietic and related tissues: Secondary | ICD-10-CM | POA: Insufficient documentation

## 2024-06-26 DIAGNOSIS — Z79899 Other long term (current) drug therapy: Secondary | ICD-10-CM | POA: Insufficient documentation

## 2024-06-26 DIAGNOSIS — J4489 Other specified chronic obstructive pulmonary disease: Secondary | ICD-10-CM | POA: Diagnosis not present

## 2024-06-26 DIAGNOSIS — R0602 Shortness of breath: Secondary | ICD-10-CM | POA: Diagnosis not present

## 2024-06-26 DIAGNOSIS — I110 Hypertensive heart disease with heart failure: Secondary | ICD-10-CM | POA: Insufficient documentation

## 2024-06-26 DIAGNOSIS — Z87891 Personal history of nicotine dependence: Secondary | ICD-10-CM | POA: Insufficient documentation

## 2024-06-26 DIAGNOSIS — Z7901 Long term (current) use of anticoagulants: Secondary | ICD-10-CM | POA: Insufficient documentation

## 2024-06-26 DIAGNOSIS — I2510 Atherosclerotic heart disease of native coronary artery without angina pectoris: Secondary | ICD-10-CM | POA: Insufficient documentation

## 2024-06-26 DIAGNOSIS — Z8616 Personal history of COVID-19: Secondary | ICD-10-CM | POA: Diagnosis not present

## 2024-06-26 DIAGNOSIS — R918 Other nonspecific abnormal finding of lung field: Secondary | ICD-10-CM | POA: Diagnosis not present

## 2024-06-26 DIAGNOSIS — Z950 Presence of cardiac pacemaker: Secondary | ICD-10-CM | POA: Diagnosis not present

## 2024-06-26 DIAGNOSIS — Z951 Presence of aortocoronary bypass graft: Secondary | ICD-10-CM | POA: Diagnosis not present

## 2024-06-26 LAB — CBC
HCT: 36.5 % (ref 36.0–46.0)
Hemoglobin: 12.3 g/dL (ref 12.0–15.0)
MCH: 30.2 pg (ref 26.0–34.0)
MCHC: 33.7 g/dL (ref 30.0–36.0)
MCV: 89.7 fL (ref 80.0–100.0)
Platelets: 171 K/uL (ref 150–400)
RBC: 4.07 MIL/uL (ref 3.87–5.11)
RDW: 13 % (ref 11.5–15.5)
WBC: 10.5 K/uL (ref 4.0–10.5)
nRBC: 0 % (ref 0.0–0.2)

## 2024-06-26 LAB — BASIC METABOLIC PANEL WITH GFR
Anion gap: 13 (ref 5–15)
BUN: 50 mg/dL — ABNORMAL HIGH (ref 8–23)
CO2: 27 mmol/L (ref 22–32)
Calcium: 9.3 mg/dL (ref 8.9–10.3)
Chloride: 98 mmol/L (ref 98–111)
Creatinine, Ser: 1.97 mg/dL — ABNORMAL HIGH (ref 0.44–1.00)
GFR, Estimated: 24 mL/min — ABNORMAL LOW (ref >60)
Glucose, Bld: 109 mg/dL — ABNORMAL HIGH (ref 70–99)
Potassium: 3.3 mmol/L — ABNORMAL LOW (ref 3.5–5.1)
Sodium: 137 mmol/L (ref 135–145)

## 2024-06-26 LAB — PRO BRAIN NATRIURETIC PEPTIDE: Pro Brain Natriuretic Peptide: >35000 pg/mL — ABNORMAL HIGH (ref <300.0)

## 2024-06-26 MED ORDER — LORAZEPAM 1 MG PO TABS
1.0000 mg | ORAL_TABLET | Freq: Once | ORAL | Status: AC
  Administered 2024-06-26: 1 mg via ORAL
  Filled 2024-06-26: qty 1

## 2024-06-26 NOTE — ED Notes (Signed)
 Reviewed discharge instructions and follow-up care with pt. Pt verbalized understanding and had no further questions. Pt exited ED without complications.

## 2024-06-26 NOTE — Discharge Instructions (Addendum)
 You have an appointment at Dr. Parks office at 10:15 AM.  Catherine [name] there at 10:00.  Continue take all of your medications as prescribed, particularly your Bumex .  Return to the emergency room if you experience worsening shortness of breath or fever.

## 2024-06-26 NOTE — ED Triage Notes (Signed)
 Pt POV reporting persistent SOB, recently admitted 9/8 for CHF exacerbation. Appt for today cancelled by PCP, advised to come to ED for evaluation.

## 2024-06-26 NOTE — Telephone Encounter (Signed)
 Copied from CRM [id number]. Topic: General - Other >> Jun 26, 2024  3:43 PM Rosina BIRCH wrote: Reason for CRM: patient daughter called stating the patient was supposed to have had an appointment today. I told the daughter that I did not see an appointment on the patient appointment desk. The patient daughter stated that someone canceled the wrong one because it was rescheduled for today. I called [facility] and they told me the doctor told the patient to go the emergency room after she was given oxygen  at the office. I relayed the message to the daughter and she stated couldn't the doctor have given a prescription for in home oxygen  and then the daughter stated never mind and disconnected the call

## 2024-06-26 NOTE — Telephone Encounter (Signed)
 06/21/2024 Late entry  Medication Prior Authorization Status  Processed CoverMyMeds KEY: [id number] for Lidocaine  5% patches  Submitted and approved today  Per [employer] Medicare Part D Electronic   PA Case ID: #: [id number],.Rx #: [id number]   Effective 06/21/2024 through 09/26/2024.

## 2024-06-26 NOTE — ED Provider Notes (Signed)
 [facility] AT [facility] Provider Note  CSN: [id number] Arrival date & time: 06/26/24 1549  Chief Complaint(s) Shortness of Breath  HPI Catherine [name] is a 89 y.o. female who is here today after she apparently had a mixup with her PCPs office and was unable to be seen by the physician.  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  She was hospitalized at the beginning of the month through the 16[facility].  She states that since going to the doctors office today, she has been all worked up.  Patient describes feeling out of sorts.  She is asking for some medication to help her calm down.  She states that she has had shortness of breath persistently for the last several months.  She has been compliant with her medications.  She continues to have swelling in her legs.  She states that she has begun treatment for multiple myeloma.   Past Medical History Past Medical History:  Diagnosis Date   AKI (acute kidney injury) 06/22/2022   Amyloidosis (HCC) 05/24/2024   Aortic stenosis, mild 07/26/2017   Arthritis    Asthma    last attack 02/2015   Cellulitis of left lower extremity 08/17/2016   Ulcer associated with severe venous insufficiency   Chronic anticoagulation    Chronic diastolic CHF (congestive heart failure) (HCC)    Chronic kidney disease    RIGHT MANY KIDNEY INFECTIONS AND STONES   COPD (chronic obstructive pulmonary disease) (HCC)    Coronary artery disease    Dizziness    H/O: rheumatic fever    Hypertension    PAF (paroxysmal atrial fibrillation) (HCC)    PONV (postoperative nausea and vomiting)    ' SOMETIMES', BUT NOT ALWAYS   Primary hypertension    RHEUMATIC MITRAL STENOSIS 01/05/2008   Qualifier: Diagnosis of   By: Neysa MD, Clinton D        S/P Maze operation for atrial fibrillation 10/14/2016   Complete bilateral atrial lesion set using cryothermy and bipolar  radiofrequency ablation - atrial appendage was not treated due to previous surgical procedure (open mitral commissurotomy)   S/P mitral valve replacement with bioprosthetic valve 10/14/2016   29 mm Medtronic Mosaic porcine bioprosthetic tissue valve   Sciatica 08/29/2023   Tricuspid regurgitation 07/14/2021   UTI (urinary tract infection)    Valvular heart disease    Has mitral stenosis with prior mitral commissurotomy in 1970   Patient Active Problem List   Diagnosis Date Noted   Hypoalbuminemia 06/08/2024   CHF exacerbation (HCC) 06/04/2024   Multiple myeloma (HCC) 06/04/2024   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   Persistent proteinuria 05/17/2024   Pleural effusion due to CHF (congestive heart failure) (HCC) 05/14/2024   Essential hypertension 04/21/2024   Chronic venous hypertension (idiopathic) with ulcer and inflammation of left lower extremity (HCC) 04/18/2024   Acute on chronic diastolic CHF (congestive heart failure) (HCC) 04/15/2024   CAP (community acquired pneumonia) 03/26/2024   Hyperkalemia 03/26/2024   Bilateral lower extremity edema 03/26/2024   Acute hypoxemic respiratory failure (HCC) 03/24/2024   Sick sinus syndrome (HCC) 09/21/2023   Sinus pause 09/13/2023   Chronic anticoagulation 09/13/2023   S/P mitral valve replacement with bioprosthetic valve - Oct 14, 2016 09/12/2023   Chronic diastolic CHF (congestive heart failure) (HCC) 09/12/2023   Chronic kidney disease, stage 3a (HCC) 09/12/2023   Hypokalemia 09/12/2023   Pneumothorax on left 09/10/2023   Statin intolerance 09/08/2023  Sciatica 08/29/2023   Chronic low back pain 08/03/2022   AKI (acute kidney injury) 06/22/2022   COPD  GOLD 2 03/02/2022   Acute on chronic heart failure with preserved ejection fraction (HFpEF) (HCC) 01/05/2022   Tricuspid regurgitation 07/14/2021   Protein-calorie malnutrition, severe 09/05/2019   Enteritis 09/03/2019   COVID-19 virus infection 08/27/2019   Dysuria  07/07/2019   Chronic anxiety 12/23/2017   Aortic stenosis, mild 07/26/2017   S/P Maze operation for atrial fibrillation 10/14/2016   S/P CABG x 1 10/14/2016   Coronary artery disease    Chronic insomnia 07/12/2016   DOE (dyspnea on exertion) 03/26/2015   Atrial fibrillation, chronic (HCC) 03/16/2015   Valvular heart disease 03/16/2015   Nephrolithiasis 03/16/2015   Primary hypertension    Hyperlipidemia LDL goal <70 04/28/2011   Rheumatic heart disease 01/05/2008   Atrial fibrillation (HCC) 01/05/2008   Home Medication(s) Prior to Admission medications   Medication Sig Start Date End Date Taking? Authorizing Provider  acetaminophen  (TYLENOL ) 500 MG tablet Take 500 mg by mouth every 6 (six) hours as needed for mild pain (pain score 1-3). Patient not taking: Reported on 06/20/2024    [provider]  albuterol  (PROVENTIL ) (2.5 MG/3ML) 0.083% nebulizer solution Take 3 mLs (2.5 mg total) by nebulization every 6 (six) hours as needed (during COPD Exacerbation). 02/15/24 02/14/25  Hope Almarie ORN, NP  albuterol  (VENTOLIN  HFA) 108 (90 Base) MCG/ACT inhaler INHALE 2 PUFFS INTO THE LUNGS EVERY 4 HOURS AS NEEDED FOR AHEEZING OR SHORTNESS OF BREATH 02/21/24   Parrett, Madelin RAMAN, NP  bumetanide  (BUMEX ) 1 MG tablet Take 2 tablets (2 mg total) by mouth daily. Patient taking differently: Take 2 mg by mouth 2 (two) times daily. 05/25/24 06/24/24  Leotis Bogus, MD  diazepam  (VALIUM ) 5 MG tablet TAKE ONE TABLET BY MOUTH AT BEDTIME 12/12/23   Burchette, Wolm ORN, MD  ELIQUIS  2.5 MG TABS tablet TAKE ONE TABLET TWICE DAILY 06/21/24   Burchette, Wolm ORN, MD  empagliflozin  (JARDIANCE ) 10 MG TABS tablet Take 1 tablet (10 mg total) by mouth daily. 06/01/24   Burchette, Wolm ORN, MD  famciclovir  (FAMVIR ) 250 MG tablet Take 1 tablet (250 mg total) by mouth daily. 06/20/24   Timmy Maude SAUNDERS, MD  feeding supplement (ENSURE PLUS HIGH PROTEIN) LIQD Take 237 mLs by mouth 2 (two) times daily between meals. 04/25/24    Arrien, Mauricio Daniel, MD  fludrocortisone  (FLORINEF ) 0.1 MG tablet Take 2 tablets (0.2 mg total) by mouth daily. 06/20/24   Timmy Maude SAUNDERS, MD  guaiFENesin  (MUCINEX ) 600 MG 12 hr tablet Take 1 tablet (600 mg total) by mouth 2 (two) times daily as needed for to loosen phlegm or cough. 04/25/24   Arrien, Mauricio Daniel, MD  HYDROcodone -acetaminophen  (NORCO/VICODIN) 5-325 MG tablet TAKE ONE TABLET EVERY 8 HOURS AS NEEDED FOR PAIN 05/12/24   Burchette, Wolm ORN, MD  lidocaine  (LIDODERM ) 5 % Place 1 patch onto the skin daily. Remove & Discard patch within 12 hours or as directed by MD 06/20/24   Timmy Maude SAUNDERS, MD  metolazone  (ZAROXOLYN ) 5 MG tablet Take metolazone  in the morning on Mondays and Thursdays, take extra potassium 40 mEq with metolazone  06/18/24   Swinyer, Rosaline HERO, NP  ondansetron  (ZOFRAN ) 4 MG tablet Take 1 tablet (4 mg total) by mouth every 8 (eight) hours as needed for nausea or vomiting. 06/01/24   Burchette, Wolm ORN, MD  ondansetron  (ZOFRAN ) 8 MG tablet Take 8 mg by mouth 30 to 60 min prior to Cyclophosphamide  administration then take 8 mg every 8 hrs as needed for nausea and vomiting. Patient not taking: Reported on 06/20/2024 06/19/24   Timmy Maude SAUNDERS, MD  potassium chloride  SA (KLOR-CON  M) 20 MEQ tablet Take potassium 40 mEq daily, take extra 40 EQ of potassium on the days you take metolazone  06/12/24   Sharmila Wrobleski, Preetha, MD  prochlorperazine  (COMPAZINE ) 10 MG tablet Take 1 tablet (10 mg total) by mouth every 6 (six) hours as needed for nausea or vomiting. Patient not taking: Reported on 06/20/2024 06/19/24   Timmy Maude SAUNDERS, MD                                                                                                                                    Past Surgical History Past Surgical History:  Procedure Laterality Date   ABDOMINAL HYSTERECTOMY  1983   endometriosis   APPENDECTOMY     BACK SURGERY     neurosurgery x2   CARDIAC CATHETERIZATION     CARDIAC CATHETERIZATION  N/A 08/03/2016   Procedure: Right/Left Heart Cath and Coronary Angiography;  Surgeon: Peter M Swaziland, MD;  Location: [facility] INVASIVE CV LAB;  Service: Cardiovascular;  Laterality: N/A;   cataract surg     CHOLECYSTECTOMY N/A 05/30/2015   Procedure: LAPAROSCOPIC CHOLECYSTECTOMY WITH INTRAOPERATIVE CHOLANGIOGRAM;  Surgeon: Morene Olives, MD;  Location: [facility] OR;  Service: General;  Laterality: N/A;   COLONOSCOPY     CORONARY ARTERY BYPASS GRAFT N/A 10/14/2016   Procedure: CORONARY ARTERY BYPASS GRAFTING (CABG);  Surgeon: Sudie VEAR Laine, MD;  Location: [facility] OR;  Service: Open Heart Surgery;  Laterality: N/A;   EYE SURGERY     IR BONE MARROW BIOPSY & ASPIRATION  05/21/2024   IR THORACENTESIS ASP PLEURAL SPACE W/IMG GUIDE  04/24/2024   MAZE N/A 10/14/2016   Procedure: MAZE;  Surgeon: Sudie VEAR Laine, MD;  Location: [facility] OR;  Service: Open Heart Surgery;  Laterality: N/A;   MITRAL VALVE REPLACEMENT N/A 10/14/2016   Procedure: REDO MITRAL VALVE REPLACEMENT (MVR);  Surgeon: Sudie VEAR Laine, MD;  Location: [facility] OR;  Service: Open Heart Surgery;  Laterality: N/A;   MITRAL VALVE SURGERY Left 1970   Open mitral commissurotomy via left thoracotomy approach   PACEMAKER IMPLANT N/A 09/22/2023   Procedure: PACEMAKER IMPLANT;  Surgeon: Kennyth Chew, MD;  Location: [facility] INVASIVE CV LAB;  Service: Cardiovascular;  Laterality: N/A;   RIGHT HEART CATH N/A 05/17/2024   Procedure: RIGHT HEART CATH;  Surgeon: Darron Deatrice LABOR, MD;  Location: [facility] INVASIVE CV LAB;  Service: Cardiovascular;  Laterality: N/A;   TEE WITHOUT CARDIOVERSION N/A 07/05/2016   Procedure: TRANSESOPHAGEAL ECHOCARDIOGRAM (TEE);  Surgeon: Annabella Scarce, MD;  Location: [facility] ENDOSCOPY;  Service: Cardiovascular;  Laterality: N/A;   TEE WITHOUT CARDIOVERSION N/A 10/14/2016   Procedure: TRANSESOPHAGEAL ECHOCARDIOGRAM (TEE);  Surgeon: Sudie VEAR Laine, MD;  Location: [facility] OR;  Service: Open Heart Surgery;  Laterality: N/A;   Family History Family  History   Problem Relation Age of Onset   Leukemia Father    Breast cancer Neg Hx     Social History Social History   Tobacco Use   Smoking status: Former    Current packs/day: 0.00    Average packs/day: 1 pack/day for 15.0 years (15.0 ttl pk-yrs)    Types: Cigarettes    Start date: 09/27/1960    Quit date: 09/28/1975    Years since quitting: 48.7   Smokeless tobacco: Never  Vaping Use   Vaping status: Never Used  Substance Use Topics   Alcohol use: No    Alcohol/week: 0.0 standard drinks of alcohol   Drug use: No   Allergies Aldactone  [spironolactone ], Amoxil  [amoxicillin ], Zebeta  [bisoprolol ], Calan  [verapamil ], Crestor  H[place]go.Handy ], Flovent  [fluticasone  propionate], Livalo  [pitavastatin ], Lyrica  [pregabalin ], Neurontin [gabapentin], Norvasc  [amlodipine ], Zetia  [ezetimibe ], Zocor  [simvastatin ], Lotensin [benazepril], and Polytrim  [polymyxin b -trimethoprim ]  Review of Systems Review of Systems  Physical Exam Vital Signs  I have reviewed the triage vital signs BP 122/71 (BP Location: Left Arm)   Pulse 62   Temp 97.8 F (36.6 C) (Oral)   Resp 17   SpO2 97%   Physical Exam Vitals and nursing note reviewed.  Cardiovascular:     Rate and Rhythm: Normal rate.  Pulmonary:     Effort: Pulmonary effort is normal.     Breath sounds: No decreased breath sounds, wheezing or rales.  Musculoskeletal:        General: Normal range of motion.     Right lower leg: No edema.     Left lower leg: No edema.  Neurological:     Mental Status: She is alert.     ED Results and Treatments Labs (all labs ordered are listed, but only abnormal results are displayed) Labs Reviewed  BASIC METABOLIC PANEL WITH GFR - Abnormal; Notable for the following components:      Result Value   Potassium 3.3 (*)    Glucose, Bld 109 (*)    BUN 50 (*)    Creatinine, Ser 1.97 (*)    GFR, Estimated 24 (*)    All other components within normal limits  PRO BRAIN NATRIURETIC PEPTIDE - Abnormal; Notable for  the following components:   Pro Brain Natriuretic Peptide >35,000.0 (*)    All other components within normal limits  CBC                                                                                                                          Radiology DG Chest 2 View Result Date: 06/26/2024 CLINICAL DATA:  Shortness of breath. EXAM: CHEST - 2 VIEW COMPARISON:  June 04, 2024. FINDINGS: Stable cardiomediastinal silhouette. Status post coronary artery bypass graft. Left-sided pacemaker is unchanged. Bilateral pleural effusions are again noted with associated bibasilar atelectasis or edema, right greater than left. Bony thorax is unremarkable. IMPRESSION: Stable bilateral pleural effusions with associated bibasilar atelectasis or edema, right greater than left. Electronically Signed   By: Lynwood Seip  Jr M.D.   On: 06/26/2024 17:11    Pertinent labs & imaging results that were available during my care of the patient were reviewed by me and considered in my medical decision making (see MDM for details).  Medications Ordered in ED Medications  LORazepam (ATIVAN) tablet 1 mg (1 mg Oral Given 06/26/24 1634)                                                                                                                                     Procedures Procedures  (including critical care time)  Medical Decision Making / ED Course   This patient presents to the ED for concern of anxiety and shortness of breath, this involves an extensive number of treatment options, and is a complaint that carries with it a high risk of complications and morbidity.  The differential diagnosis includes anxiety, chronic heart failure, fluid overload, pulmonary edema.  MDM: On exam, patient overall well-appearing.  She has reassuring vital signs.  She has lower extremity edema, however this appears to be chronic for the patient.  She has clear lung sounds.  Will get a chest x-ray, BNP and basic blood work on the  patient.  Patient is very upset about not being able to see her doctor.  She was asking many questions about the current plan for her multiple myeloma, had questions even about her diagnosis of multiple myeloma.  I answered the patient's questions to the best of my ability, however I think that she mostly has questions which are better suited for her primary care physician and not exactly a medical emergency today.  Will do a workup to assess patient's fluid status.  I am providing her with Ativan because she is extremely anxious.  Reassessment 5:45 PM-my independent review of the patient's chest x-ray shows trace bilateral pleural effusions that are stable in nature.  Her BNP is greater than 35,000.  Reviewed her previous BNP's.  Patient continues to [facility] greater than 95% on room air.  She is feeling quite a bit better after having some Ativan.  Reviewed the patient's inpatient cardiology notes.  Unfortunately, patient is likely approaching end-stage heart failure, nursing consideration for hospice.  I do not think her elevated BNP alone is enough to strictly recommend admission for the patient given her no significant work of breathing.  I believe she is roughly at her baseline today.  I did offer admission to the patient and her husband, however she states she wants to go home stating she spent too much time in the hospital recently.  I was able to call the patient's primary care office and help get her a follow-up appointment for 1015 tomorrow.  Ultimately, this patient has significant heart failure, and she does not have many options at this point for management.    Additional history obtained: -Additional history obtained from husband at bedside -External records from outside source obtained and reviewed  including: Chart review including previous notes, labs, imaging, consultation notes   Lab Tests: -I ordered, reviewed, and interpreted labs.   The pertinent results include:    Labs Reviewed  BASIC METABOLIC PANEL WITH GFR - Abnormal; Notable for the following components:      Result Value   Potassium 3.3 (*)    Glucose, Bld 109 (*)    BUN 50 (*)    Creatinine, Ser 1.97 (*)    GFR, Estimated 24 (*)    All other components within normal limits  PRO BRAIN NATRIURETIC PEPTIDE - Abnormal; Notable for the following components:   Pro Brain Natriuretic Peptide >35,000.0 (*)    All other components within normal limits  CBC      EKG sinus rhythm, no acute ischemia.  EKG Interpretation Date/Time:    Ventricular Rate:    PR Interval:    QRS Duration:    QT Interval:    QTC Calculation:   R Axis:      Text Interpretation:           Imaging Studies ordered: I ordered imaging studies including chest x-ray I independently visualized and interpreted imaging. I agree with the radiologist interpretation   Medicines ordered and prescription drug management: Meds ordered this encounter  Medications   LORazepam (ATIVAN) tablet 1 mg    -I have reviewed the patients home medicines and have made adjustments as needed   Cardiac Monitoring: The patient was maintained on a cardiac monitor.  I personally viewed and interpreted the cardiac monitored which showed an underlying rhythm of: Normal sinus rhythm   Reevaluation: After the interventions noted above, I reevaluated the patient and found that they have :improved  Co morbidities that complicate the patient evaluation  Past Medical History:  Diagnosis Date   AKI (acute kidney injury) 06/22/2022   Amyloidosis (HCC) 05/24/2024   Aortic stenosis, mild 07/26/2017   Arthritis    Asthma    last attack 02/2015   Cellulitis of left lower extremity 08/17/2016   Ulcer associated with severe venous insufficiency   Chronic anticoagulation    Chronic diastolic CHF (congestive heart failure) (HCC)    Chronic kidney disease    RIGHT MANY KIDNEY INFECTIONS AND STONES   COPD (chronic obstructive pulmonary  disease) (HCC)    Coronary artery disease    Dizziness    H/O: rheumatic fever    Hypertension    PAF (paroxysmal atrial fibrillation) (HCC)    PONV (postoperative nausea and vomiting)    ' SOMETIMES', BUT NOT ALWAYS   Primary hypertension    RHEUMATIC MITRAL STENOSIS 01/05/2008   Qualifier: Diagnosis of   By: Neysa MD, Clinton D        S/P Maze operation for atrial fibrillation 10/14/2016   Complete bilateral atrial lesion set using cryothermy and bipolar radiofrequency ablation - atrial appendage was not treated due to previous surgical procedure (open mitral commissurotomy)   S/P mitral valve replacement with bioprosthetic valve 10/14/2016   29 mm Medtronic Mosaic porcine bioprosthetic tissue valve   Sciatica 08/29/2023   Tricuspid regurgitation 07/14/2021   UTI (urinary tract infection)    Valvular heart disease    Has mitral stenosis with prior mitral commissurotomy in 1970      Dispostion: I considered admission for this patient, however through shared decision making, the patient prefers to be discharged and she has close follow-up tomorrow.     Final Clinical Impression(s) / ED Diagnoses Final diagnoses:  Chronic heart failure, unspecified heart failure  type [facility])     @PCDICTATION @    Mannie Pac T, DO 06/26/24 1750

## 2024-06-27 ENCOUNTER — Encounter: Payer: Self-pay | Admitting: Family Medicine

## 2024-06-27 ENCOUNTER — Ambulatory Visit: Admitting: Family Medicine

## 2024-06-27 VITALS — BP 116/60 | HR 90 | Temp 97.4°F | Wt 126.5 lb

## 2024-06-27 DIAGNOSIS — C9000 Multiple myeloma not having achieved remission: Secondary | ICD-10-CM

## 2024-06-27 DIAGNOSIS — I5033 Acute on chronic diastolic (congestive) heart failure: Secondary | ICD-10-CM

## 2024-06-27 DIAGNOSIS — R0602 Shortness of breath: Secondary | ICD-10-CM

## 2024-06-27 DIAGNOSIS — Z789 Other specified health status: Secondary | ICD-10-CM | POA: Diagnosis not present

## 2024-06-27 DIAGNOSIS — I4891 Unspecified atrial fibrillation: Secondary | ICD-10-CM | POA: Diagnosis not present

## 2024-06-27 DIAGNOSIS — E43 Unspecified severe protein-calorie malnutrition: Secondary | ICD-10-CM | POA: Diagnosis not present

## 2024-06-27 NOTE — Progress Notes (Signed)
 Remote PPM Transmission

## 2024-06-27 NOTE — Addendum Note (Signed)
 Addended by: METTA KRISTEN CROME on: 06/27/2024 05:21 PM   Modules accepted: Orders

## 2024-06-27 NOTE — Patient Instructions (Signed)
 Get back on your diuretics  We will try to get you set up with home oxygen  as soon as possible  Also setting up social worker consult  Let me know if Palliative Care has not contacted you within next week.

## 2024-06-27 NOTE — Progress Notes (Signed)
 Established Patient Office Visit  Subjective   Patient ID: Catherine [name], female    DOB: 1935/06/29  Age: 89 y.o. MRN: [id number]  Chief Complaint  Patient presents with   Hospitalization Follow-up    HPI   Catherine [name] has complex medical history as below.  Unfortunately, there was a mixup in schedule yesterday and she had initially been on the schedule but then for some reason was taken off.  She ended up going to the ER with some ongoing shortness of breath and BNP level was over 35,000.  Her heart failure and dyspnea are very chronic and it was felt that she had no acute indication for admission.  She has had 4 admissions over the past few months and very high risk for readmission.  She had recently inquired about getting home oxygen  and we had recommended follow-up to document O2 sats at rest and with exertion.  She has had some recent progressive lower extremity edema and states that she has not been taking her Bumex  or metolazone  regularly over recent weeks.  There have been some concerns regarding finances with her medications as these were costing quite a bit.  She has been referred to palliative care but they have not been out to the home yet.  Chronic heart failure with preserved ejection fraction.  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  History of multiple prior surgeries including CABG, maze operation for atrial fibrillation, and history of mitral valve replacement with bioprosthetic valve 2018.  She has really had progressive decline over the past few years.  Multiple hospitalizations as above.  Recently quit taking her diuretic medications regularly and she described that cost may be  part of the issue.  Her weight on 9-22 was 119 pounds and up to 126 pounds today.  Progressive lower extremity edema.  She had walked 10 requesting oxygen  recently.  She was on this recently and felt better symptomatically.  Recently had 1 treatment for multi myeloma and is not sure she wishes to proceed with any further treatments at this time.  She feels very generally weak overall.  Poor nutrition intake.  Chronic dyspnea unchanged.  Recent right heart cath confirmed moderate pulmonary hypertension.  Past Medical History:  Diagnosis Date   AKI (acute kidney injury) 06/22/2022   Amyloidosis (HCC) 05/24/2024   Aortic stenosis, mild 07/26/2017   Arthritis    Asthma    last attack 02/2015   Cellulitis of left lower extremity 08/17/2016   Ulcer associated with severe venous insufficiency   Chronic anticoagulation    Chronic diastolic CHF (congestive heart failure) (HCC)    Chronic kidney disease    RIGHT MANY KIDNEY INFECTIONS AND STONES   COPD (chronic obstructive pulmonary disease) (HCC)    Coronary artery disease    Dizziness    H/O: rheumatic fever    Hypertension    PAF (paroxysmal atrial fibrillation) (HCC)    PONV (postoperative nausea and vomiting)    ' SOMETIMES', BUT NOT ALWAYS   Primary hypertension    RHEUMATIC MITRAL STENOSIS 01/05/2008   Qualifier: Diagnosis of   By: Neysa MD, Clinton D        S/P Maze operation for atrial fibrillation 10/14/2016   Complete bilateral atrial lesion set using cryothermy and bipolar radiofrequency  ablation - atrial appendage was not treated due to previous surgical procedure (open mitral commissurotomy)   S/P mitral valve replacement with bioprosthetic valve 10/14/2016   29 mm Medtronic Mosaic porcine bioprosthetic tissue valve   Sciatica 08/29/2023   Tricuspid regurgitation 07/14/2021   UTI (urinary tract infection)    Valvular heart disease    Has mitral stenosis with prior mitral commissurotomy in 1970   Past Surgical History:   Procedure Laterality Date   ABDOMINAL HYSTERECTOMY  1983   endometriosis   APPENDECTOMY     BACK SURGERY     neurosurgery x2   CARDIAC CATHETERIZATION     CARDIAC CATHETERIZATION N/A 08/03/2016   Procedure: Right/Left Heart Cath and Coronary Angiography;  Surgeon: Peter M Swaziland, MD;  Location: [facility] INVASIVE CV LAB;  Service: Cardiovascular;  Laterality: N/A;   cataract surg     CHOLECYSTECTOMY N/A 05/30/2015   Procedure: LAPAROSCOPIC CHOLECYSTECTOMY WITH INTRAOPERATIVE CHOLANGIOGRAM;  Surgeon: Morene Olives, MD;  Location: [facility] OR;  Service: General;  Laterality: N/A;   COLONOSCOPY     CORONARY ARTERY BYPASS GRAFT N/A 10/14/2016   Procedure: CORONARY ARTERY BYPASS GRAFTING (CABG);  Surgeon: Sudie VEAR Laine, MD;  [facility];  Service: Open Heart Surgery;  Laterality: N/A;   EYE SURGERY     IR BONE MARROW BIOPSY & ASPIRATION  05/21/2024   IR THORACENTESIS ASP PLEURAL SPACE W/IMG GUIDE  04/24/2024   MAZE N/A 10/14/2016   Procedure: MAZE;  Surgeon: Sudie VEAR Laine, MD;  Location: [facility] OR;  Service: Open Heart Surgery;  Laterality: N/A;   MITRAL VALVE REPLACEMENT N/A 10/14/2016   Procedure: REDO MITRAL VALVE REPLACEMENT (MVR);  Surgeon: Sudie VEAR Laine, MD;  Location[facility] OR;  Service: Open Heart Surgery;  Laterality: N/A;   MITRAL VALVE SURGERY Left 1970   Open mitral commissurotomy via left thoracotomy approach   PACEMAKER IMPLANT N/A 09/22/2023   Procedure: PACEMAKER IMPLANT;  Surgeon: Kennyth Chew, MD;  Location: [facility] INVASIVE CV LAB;  Service: Cardiovascular;  Laterality: N/A;   RIGHT HEART CATH N/A 05/17/2024   Procedure: RIGHT HEART CATH;  Surgeon: Darron Deatrice LABOR, MD;  Location: [facility] INVASIVE CV LAB;  Service: Cardiovascular;  Laterality: N/A;   TEE WITHOUT CARDIOVERSION N/A 07/05/2016   Procedure: TRANSESOPHAGEAL ECHOCARDIOGRAM (TEE);  Surgeon: Annabella Scarce, MD;  Location: [facility] ENDOSCOPY;  Service: Cardiovascular;  Laterality: N/A;   TEE WITHOUT CARDIOVERSION N/A 10/14/2016    Procedure: TRANSESOPHAGEAL ECHOCARDIOGRAM (TEE);  Surgeon: Sudie VEAR Laine, MD;  Location: [facility] OR;  Service: Open Heart Surgery;  Laterality: N/A;    reports that she quit smoking about 48 years ago. Her smoking use included cigarettes. She started smoking about 63 years ago. She has a 15 pack-year smoking history. She has never used smokeless tobacco. She reports that she does not drink alcohol and does not use drugs. family history includes Leukemia in her father. Allergies  Allergen Reactions   Aldactone  [Spironolactone ] Other (See Comments)    Dyspnea    Amoxil  [Amoxicillin ] Palpitations and Other (See Comments)    Tachycardia   Zebeta  [Bisoprolol ] Shortness Of Breath   Calan  [Verapamil ] Other (See Comments)    Myalgias    Crestor  [Rosuvastatin ] Other (See Comments)    Myalgias    Flovent  [Fluticasone  Propionate] Other (See Comments)    Leg cramps   Livalo  [Pitavastatin ] Other (See Comments)    Myalgias    Lyrica  [Pregabalin ] Swelling   Neurontin [Gabapentin] Swelling   Norvasc  [Amlodipine ] Swelling    Low extremity edema  Zetia  [Ezetimibe ] Other (See Comments)    Myalgias   Zocor  [Simvastatin ] Other (See Comments)    Myalgias    Lotensin [Benazepril] Cough   Polytrim  [Polymyxin B -Trimethoprim ] Swelling     Review of Systems  Constitutional:  Positive for malaise/fatigue. Negative for weight loss.  Respiratory:  Positive for shortness of breath.   Cardiovascular:  Positive for leg swelling. Negative for chest pain.  Gastrointestinal:  Negative for abdominal pain and blood in stool.  Genitourinary:  Negative for dysuria.      Objective:     BP 116/60   Pulse 90   Temp (!) 97.4 F (36.3 C) (Oral)   Wt 126 lb 8 oz (57.4 kg)   SpO2 95%   BMI 22.41 kg/m  BP Readings from Last 3 Encounters:  06/27/24 116/60  06/26/24 129/78  06/20/24 122/68   Wt Readings from Last 3 Encounters:  06/27/24 126 lb 8 oz (57.4 kg)  06/18/24 119 lb (54 kg)  06/12/24 125 lb (56.7  kg)      Physical Exam Vitals reviewed.  Constitutional:      Appearance: She is ill-appearing.  Cardiovascular:     Rate and Rhythm: Normal rate.  Pulmonary:     Comments: Diminished breath sounds both bases Musculoskeletal:     Comments: She has significant pitting edema legs bilaterally up to the thighs.  Neurological:     General: No focal deficit present.     Mental Status: She is alert.      No results found for any visits on 06/27/24.  Last CBC Lab Results  Component Value Date   WBC 10.5 06/26/2024   HGB 12.3 06/26/2024   HCT 36.5 06/26/2024   MCV 89.7 06/26/2024   MCH 30.2 06/26/2024   RDW 13.0 06/26/2024   PLT 171 06/26/2024   Last metabolic panel Lab Results  Component Value Date   GLUCOSE 109 (H) 06/26/2024   NA 137 06/26/2024   K 3.3 (L) 06/26/2024   CL 98 06/26/2024   CO2 27 06/26/2024   BUN 50 (H) 06/26/2024   CREATININE 1.97 (H) 06/26/2024   GFRNONAA 24 (L) 06/26/2024   CALCIUM  9.3 06/26/2024   PHOS 4.4 05/22/2024   PROT 6.3 (L) 06/20/2024   ALBUMIN  2.8 (L) 06/20/2024   LABGLOB 3.3 06/20/2024   AGRATIO 0.7 06/20/2024   BILITOT 0.3 06/20/2024   ALKPHOS 103 06/20/2024   AST 38 06/20/2024   ALT 14 06/20/2024   ANIONGAP 13 06/26/2024   Last lipids Lab Results  Component Value Date   CHOL 227 (H) 04/09/2024   HDL 42 04/09/2024   LDLCALC 127 (H) 04/09/2024   LDLDIRECT 68.0 09/24/2019   TRIG 328 (H) 04/09/2024   CHOLHDL 5.4 (H) 04/09/2024   Last hemoglobin A1c Lab Results  Component Value Date   HGBA1C 5.3 10/12/2016   Last thyroid  functions Lab Results  Component Value Date   TSH 5.039 (H) 05/14/2024   T3TOTAL 102 05/15/2024      The ASCVD Risk score (Arnett DK, et al., 2019) failed to calculate for the following reasons:   The 2019 ASCVD risk score is only valid for ages 41 to 81    Assessment & Plan:   #1 chronic heart failure with preserved ejection fraction.  She has had recurrent history of progressive edema and  shortness of breath.  Recently admits to not taking her oral diuretics including Bumex  or metolazone  regularly.  Strongly encouraged to get back on those.  We have recommended social work  consult as she states cost has been an issue with acquiring medications.  High risk for readmission.  We talked about goals of care at some length today. - Palliative care consult has already been placed and is pending.  We did discuss that might want to consider possible hospice consult at some point soon but she has not express interest in this previously.  #2 chronic dyspnea.  Likely related to progressive heart failure as above.  She does have moderate pulmonary hypertension by recent right heart cath.  -O2 sats today 95% room air at rest - O2 sats dropped to 88% room air with exertion -patient improved on oxygen  2 liters symptomatically after exertion -97%  #3 multiple myeloma recently diagnosed.  Patient had 1 treatment but feels like she may be too weak to go through further treatments at this point.  She will discuss with oncologist.  #4 protein calorie malnutrition.  Patient has very poor appetite.  Discussed importance of adequate nutrition.  She has recently taken some Zofran  intermittently for nausea.  #5 history of atrial fibrillation.  Patient remains on Eliquis  and states she is taking that regularly.  Wolm Scarlet, MD

## 2024-06-28 ENCOUNTER — Telehealth: Payer: Self-pay | Admitting: *Deleted

## 2024-06-28 ENCOUNTER — Encounter: Payer: Self-pay | Admitting: Family Medicine

## 2024-06-28 ENCOUNTER — Inpatient Hospital Stay

## 2024-06-28 NOTE — Telephone Encounter (Signed)
 Copied from CRM #[id number]. Topic: Clinical - Order For Equipment >> Jun 28, 2024 11:29 AM [name] H wrote: Reason for CRM: Patient following up with office regarding oxygen  that was ordered, reached out to [facility] and was advised oxygen  was ordered by office through [employer]. Agent relayed information to patient and she would like to know if someone can reach out to [employer] and call her with an ETA states she needs her oxygen .  Jerrica [phone]

## 2024-06-28 NOTE — Telephone Encounter (Signed)
 See Mychart message sent to patient as community message was received from [employer].

## 2024-06-28 NOTE — Progress Notes (Signed)
 Care Guide Pharmacy Note  06/28/2024 Name: Catherine [name] MRN: [id number] DOB: 1935-06-11  Referred By: Micheal Wolm ORN, MD Reason for referral: Complex Care Management (Outreach to schedule referral with pharmacist )   Catherine [name] is a 89 y.o. year old female who is a primary care patient of Burchette, Wolm ORN, MD.  Catherine [name] was referred to the pharmacist for assistance related to: HLD  Successful contact was made with the patient to discuss pharmacy services.  Patient declines engagement at this time. Contact information was provided to the patient should they wish to reach out for assistance at a later time.  Thedford Franks, CMA [place]  [facility] Guide Direct Dial: [phone]  Fax: [phone] Website: [email or web address]

## 2024-06-29 ENCOUNTER — Encounter: Payer: Self-pay | Admitting: Family Medicine

## 2024-07-02 ENCOUNTER — Encounter (HOSPITAL_BASED_OUTPATIENT_CLINIC_OR_DEPARTMENT_OTHER): Payer: Self-pay | Admitting: Cardiovascular Disease

## 2024-07-02 ENCOUNTER — Encounter: Payer: Self-pay | Admitting: Family Medicine

## 2024-07-02 NOTE — Telephone Encounter (Signed)
 Noted- we were aware Palliative care was in  process.  Wolm LELON Scarlet MD [facility]

## 2024-07-04 ENCOUNTER — Ambulatory Visit: Payer: Self-pay

## 2024-07-04 ENCOUNTER — Encounter: Admitting: Internal Medicine

## 2024-07-04 NOTE — Telephone Encounter (Signed)
 FYI Only or Action Required?: FYI only for provider.  Patient was last seen in primary care on 06/27/2024 by Micheal Wolm ORN, MD.  Called Nurse Triage reporting Poor Appetite.  Symptoms began several weeks ago.  Interventions attempted: Rest, hydration, or home remedies and Other: repositioning and Home O2.  Symptoms are: unchanged.  Triage Disposition: See Hospice Nurse Within 24 Hours  Patient/caregiver understands and will follow disposition?: Yes Pts husband called to report that patient has poor appetite, has some SOB, and has leg swelling with seepage. Patient is under hospice care and was seen yesterday by hospice nurse.  Per patient, she is unsure of why her husband phoned PCP office, but she has placed a call to hospice nurse to update on her condition.  Pt ended call abruptly.  Will send HP to clinic to expedite FYI  Copied from CRM [id number]. Topic: Clinical - Red Word Triage >> Jul 04, 2024  1:45 PM Alfonso ORN wrote: Red Word that prompted transfer to Nurse Triage: pt hsuband said she has been getting worse currently at home hospice and waiting on nurse. He stated that she isnt eating Reason for Disposition  [1] MILD difficulty breathing AND [2] getting worse  Answer Assessment - Initial Assessment Questions 1. RESPIRATORY STATUS: Please describe your (your loved one's; the patient's) breathing to me. (e.g., shortness of breath, wheezing, noisy or congested, trouble speaking, can't get enough air; severity: mild, moderate, severe). Note: If possible, it is best to ask the patient directly about their breathing.      Shortness of breath  2. NONVERBAL SIGNS OF DISTRESS: Do they look anxious or fearful? Are they restless or agitated? Do their nostrils widen (flare) when they breathe in? Do the areas around their collar bones, chest, or abdomen sink in when they try to take a breath? Are they making grunting noises with breathing?     No, patient does not seem restless  or agitated, she was able to speak with this RN and relay her symptoms  3. ONSET: When did the trouble breathing begin?      Has had trouble breathing for 1 week  4. PATTERN: Does the trouble breathing come and go, or is it constant since it started? Does anything make it worse or better? Has it changed in the past 24 hours?     Persistent  5. LEVEL OF CONSCIOUSNESS: Do they seem to be awake or aware of what is going on? (e.g., awake, responds to verbal stimuli, responds to painful stimuli, unresponsive)     Patient seems to be aware and awake of what is going on  6. SYMPTOM - PAIN: Is there any pain? If yes, can you describe how severe (mild, moderate, severe; other signs of pain such as restless, groaning).    Patient denies pain at this time  7. SYMPTOM - OTHER: Are there any other symptoms? (e.g., cough, fever, wheezing, anxiety, abdomen swelling or distension)     Leg swelling with seepage; patient has hx of progressive edema. Reportedly non compliant with diuretic  8. HISTORY OF HEART OR LUNG DISEASE: Do you have any underlying heart or lung disease? (e.g., COPD, heart failure, lung cancer)     Chronice HF with preserved EF and multiple myeloma  9. CAUSE: What do you think is causing the breathing difficulty?     HF  10. TREATMENT: What have you done so far to treat the breathing difficulty? What has helped? (e.g., positioning, oxygen , prescribed medicines such as morphine )  Oxygen  and positioning  11. OXYGEN : Are you on oxygen ? If yes, How much (liters/min) and what type (e.g., prongs, mask, etc.)?        2LNC   12. CALLER's COPING: How are you doing? How are other family members and loved ones doing?       Patient says overall her symptoms have not changed since hospice nurse visited yesterday  Protocols used: Hospice - Breathing Difficulty-A-AH

## 2024-07-05 ENCOUNTER — Inpatient Hospital Stay

## 2024-07-09 ENCOUNTER — Other Ambulatory Visit: Payer: Self-pay | Admitting: Family Medicine

## 2024-07-09 NOTE — Telephone Encounter (Signed)
 Noted

## 2024-07-10 ENCOUNTER — Other Ambulatory Visit: Payer: Self-pay | Admitting: Cardiovascular Disease

## 2024-07-10 MED ORDER — BENZONATATE 100 MG PO CAPS: 100.0000 mg | ORAL_CAPSULE | Freq: Three times a day (TID) | ORAL | 0 refills | Status: DC | PRN

## 2024-07-10 NOTE — Progress Notes (Signed)
 Called to check on Mrs. Turlington.  She is really struggling from shortness of breath and cough.  Encouraged her to take her diuretics.  Will also call in tessalon  perles to see if it will help her cough.   Destenie Ingber C. Raford, MD, [facility] 07/10/2024 5:39 PM

## 2024-07-11 ENCOUNTER — Ambulatory Visit: Admitting: Hematology & Oncology

## 2024-07-11 ENCOUNTER — Inpatient Hospital Stay

## 2024-07-12 ENCOUNTER — Inpatient Hospital Stay

## 2024-07-12 ENCOUNTER — Ambulatory Visit: Admitting: Hematology & Oncology

## 2024-07-12 ENCOUNTER — Encounter (HOSPITAL_COMMUNITY): Payer: Self-pay

## 2024-07-12 ENCOUNTER — Emergency Department (HOSPITAL_COMMUNITY)

## 2024-07-12 ENCOUNTER — Ambulatory Visit

## 2024-07-12 ENCOUNTER — Other Ambulatory Visit: Payer: Self-pay

## 2024-07-12 ENCOUNTER — Observation Stay (HOSPITAL_COMMUNITY)
Admission: EM | Admit: 2024-07-12 | Discharge: 2024-07-13 | Disposition: A | Discharge status: Hospice | Attending: Internal Medicine | Admitting: Internal Medicine

## 2024-07-12 DIAGNOSIS — I5A Non-ischemic myocardial injury (non-traumatic): Secondary | ICD-10-CM

## 2024-07-12 DIAGNOSIS — I130 Hypertensive heart and chronic kidney disease with heart failure and stage 1 through stage 4 chronic kidney disease, or unspecified chronic kidney disease: Secondary | ICD-10-CM | POA: Diagnosis not present

## 2024-07-12 DIAGNOSIS — I5033 Acute on chronic diastolic (congestive) heart failure: Secondary | ICD-10-CM | POA: Diagnosis not present

## 2024-07-12 DIAGNOSIS — I509 Heart failure, unspecified: Secondary | ICD-10-CM | POA: Diagnosis not present

## 2024-07-12 DIAGNOSIS — E860 Dehydration: Secondary | ICD-10-CM | POA: Diagnosis not present

## 2024-07-12 DIAGNOSIS — R918 Other nonspecific abnormal finding of lung field: Secondary | ICD-10-CM | POA: Diagnosis not present

## 2024-07-12 DIAGNOSIS — E871 Hypo-osmolality and hyponatremia: Secondary | ICD-10-CM

## 2024-07-12 DIAGNOSIS — N1831 Chronic kidney disease, stage 3a: Secondary | ICD-10-CM | POA: Diagnosis not present

## 2024-07-12 DIAGNOSIS — J9611 Chronic respiratory failure with hypoxia: Secondary | ICD-10-CM

## 2024-07-12 DIAGNOSIS — J449 Chronic obstructive pulmonary disease, unspecified: Secondary | ICD-10-CM | POA: Diagnosis not present

## 2024-07-12 DIAGNOSIS — R0602 Shortness of breath: Secondary | ICD-10-CM | POA: Diagnosis present

## 2024-07-12 DIAGNOSIS — C9000 Multiple myeloma not having achieved remission: Secondary | ICD-10-CM | POA: Diagnosis not present

## 2024-07-12 DIAGNOSIS — I2510 Atherosclerotic heart disease of native coronary artery without angina pectoris: Secondary | ICD-10-CM

## 2024-07-12 DIAGNOSIS — J90 Pleural effusion, not elsewhere classified: Secondary | ICD-10-CM | POA: Diagnosis not present

## 2024-07-12 DIAGNOSIS — N179 Acute kidney failure, unspecified: Principal | ICD-10-CM

## 2024-07-12 DIAGNOSIS — R110 Nausea: Secondary | ICD-10-CM | POA: Diagnosis not present

## 2024-07-12 DIAGNOSIS — I482 Chronic atrial fibrillation, unspecified: Secondary | ICD-10-CM

## 2024-07-12 DIAGNOSIS — I259 Chronic ischemic heart disease, unspecified: Principal | ICD-10-CM

## 2024-07-12 DIAGNOSIS — S2231XA Fracture of one rib, right side, initial encounter for closed fracture: Secondary | ICD-10-CM | POA: Diagnosis not present

## 2024-07-12 LAB — COMPREHENSIVE METABOLIC PANEL WITH GFR
ALT: 19 U/L (ref 0–44)
AST: 29 U/L (ref 15–41)
Albumin: 1.7 g/dL — ABNORMAL LOW (ref 3.5–5.0)
Alkaline Phosphatase: 131 U/L — ABNORMAL HIGH (ref 38–126)
Anion gap: 19 — ABNORMAL HIGH (ref 5–15)
BUN: 110 mg/dL — ABNORMAL HIGH (ref 8–23)
CO2: 18 mmol/L — ABNORMAL LOW (ref 22–32)
Calcium: 8.7 mg/dL — ABNORMAL LOW (ref 8.9–10.3)
Chloride: 92 mmol/L — ABNORMAL LOW (ref 98–111)
Creatinine, Ser: 4.09 mg/dL — ABNORMAL HIGH (ref 0.44–1.00)
GFR, Estimated: 10 mL/min — ABNORMAL LOW (ref >60)
Glucose, Bld: 144 mg/dL — ABNORMAL HIGH (ref 70–99)
Potassium: 4.4 mmol/L (ref 3.5–5.1)
Sodium: 129 mmol/L — ABNORMAL LOW (ref 135–145)
Total Bilirubin: 0.7 mg/dL (ref 0.0–1.2)
Total Protein: 5.6 g/dL — ABNORMAL LOW (ref 6.5–8.1)

## 2024-07-12 LAB — CBC
HCT: 39.7 % (ref 36.0–46.0)
Hemoglobin: 13.3 g/dL (ref 12.0–15.0)
MCH: 29.3 pg (ref 26.0–34.0)
MCHC: 33.5 g/dL (ref 30.0–36.0)
MCV: 87.4 fL (ref 80.0–100.0)
Platelets: 174 K/uL (ref 150–400)
RBC: 4.54 MIL/uL (ref 3.87–5.11)
RDW: 13.5 % (ref 11.5–15.5)
WBC: 16.8 K/uL — ABNORMAL HIGH (ref 4.0–10.5)
nRBC: 0 % (ref 0.0–0.2)

## 2024-07-12 LAB — TROPONIN I (HIGH SENSITIVITY): Troponin I (High Sensitivity): 411 ng/L (ref <18)

## 2024-07-12 MED ORDER — BIOTENE DRY MOUTH MT LIQD: 15.0000 mL | OROMUCOSAL | Status: DC | PRN

## 2024-07-12 MED ORDER — LORAZEPAM 2 MG/ML PO CONC: 1.0000 mg | ORAL | Status: DC | PRN

## 2024-07-12 MED ORDER — MORPHINE SULFATE (PF) 4 MG/ML IV SOLN
4.0000 mg | INTRAVENOUS | Status: AC | PRN
  Administered 2024-07-12 – 2024-07-13 (×2): 4 mg via INTRAVENOUS
  Filled 2024-07-12 (×2): qty 1

## 2024-07-12 MED ORDER — HALOPERIDOL LACTATE 2 MG/ML PO CONC: 0.5000 mg | ORAL | Status: DC | PRN

## 2024-07-12 MED ORDER — ALUM & MAG HYDROXIDE-SIMETH 200-200-20 MG/5ML PO SUSP: 30.0000 mL | Freq: Four times a day (QID) | ORAL | Status: DC | PRN

## 2024-07-12 MED ORDER — OXYCODONE HCL 20 MG/ML PO CONC: 5.0000 mg | ORAL | Status: DC | PRN

## 2024-07-12 MED ORDER — LACTATED RINGERS IV BOLUS: 500.0000 mL | Freq: Once | INTRAVENOUS | Status: DC

## 2024-07-12 MED ORDER — ATROPINE SULFATE 1 % OP SOLN
4.0000 [drp] | OPHTHALMIC | Status: DC | PRN
  Filled 2024-07-12: qty 2

## 2024-07-12 MED ORDER — ASPIRIN 81 MG PO CHEW
324.0000 mg | CHEWABLE_TABLET | Freq: Once | ORAL | Status: DC
  Filled 2024-07-12: qty 4

## 2024-07-12 MED ORDER — ACETAMINOPHEN 325 MG PO TABS: 650.0000 mg | ORAL_TABLET | Freq: Four times a day (QID) | ORAL | Status: DC | PRN

## 2024-07-12 MED ORDER — LORAZEPAM 1 MG PO TABS: 1.0000 mg | ORAL_TABLET | ORAL | Status: DC | PRN

## 2024-07-12 MED ORDER — ALBUTEROL SULFATE (2.5 MG/3ML) 0.083% IN NEBU: 2.5000 mg | INHALATION_SOLUTION | RESPIRATORY_TRACT | Status: DC | PRN

## 2024-07-12 MED ORDER — PROCHLORPERAZINE MALEATE 5 MG PO TABS: 5.0000 mg | ORAL_TABLET | Freq: Four times a day (QID) | ORAL | Status: DC | PRN

## 2024-07-12 MED ORDER — LORAZEPAM 2 MG/ML IJ SOLN: 1.0000 mg | INTRAMUSCULAR | Status: DC | PRN

## 2024-07-12 MED ORDER — POLYVINYL ALCOHOL 1.4 % OP SOLN: 1.0000 [drp] | Freq: Four times a day (QID) | OPHTHALMIC | Status: DC | PRN

## 2024-07-12 MED ORDER — HALOPERIDOL LACTATE 5 MG/ML IJ SOLN: 0.5000 mg | INTRAMUSCULAR | Status: DC | PRN

## 2024-07-12 MED ORDER — PROCHLORPERAZINE EDISYLATE 10 MG/2ML IJ SOLN
10.0000 mg | Freq: Two times a day (BID) | INTRAMUSCULAR | Status: DC | PRN
  Administered 2024-07-13: 10 mg via INTRAVENOUS
  Filled 2024-07-12: qty 2

## 2024-07-12 MED ORDER — MORPHINE SULFATE (PF) 4 MG/ML IV SOLN: 4.0000 mg | Freq: Once | INTRAVENOUS | Status: DC

## 2024-07-12 MED ORDER — ACETAMINOPHEN 650 MG RE SUPP
650.0000 mg | Freq: Four times a day (QID) | RECTAL | Status: AC | PRN
Start: 2024-07-12 — End: ?

## 2024-07-12 MED ORDER — LACTATED RINGERS IV BOLUS
500.0000 mL | Freq: Once | INTRAVENOUS | Status: AC
  Administered 2024-07-12: 500 mL via INTRAVENOUS

## 2024-07-12 MED ORDER — PROCHLORPERAZINE 25 MG RE SUPP: 25.0000 mg | Freq: Two times a day (BID) | RECTAL | Status: DC | PRN

## 2024-07-12 MED ORDER — HYDROMORPHONE HCL 1 MG/ML IJ SOLN
0.5000 mg | INTRAMUSCULAR | Status: DC | PRN
  Administered 2024-07-13: 1 mg via INTRAVENOUS
  Filled 2024-07-12: qty 1

## 2024-07-12 MED ORDER — HALOPERIDOL 1 MG PO TABS: 0.5000 mg | ORAL_TABLET | ORAL | Status: DC | PRN

## 2024-07-12 NOTE — ED Provider Notes (Signed)
 [place] EMERGENCY DEPARTMENT AT [facility] Provider Note   CSN: [id number] Arrival date & time: 07/12/24  2049     Patient presents with: No chief complaint on file.   Catherine [name] is a 89 y.o. female.   HPI   Patient has a history of multiple medical problems including multiple myeloma, hypertension atrial fibrillation valvular heart disease rheumatic fever COPD CHF coronary artery disease.  Patient presents to the ED with complaints of acute shortness of breath.  She has also complained of chest pressure pain.  Symptoms ongoing for the last couple of days.  Symptoms were worsening.  She was evaluated by the hospice nurse today and then sent to the ED for further evaluation  Prior to Admission medications   Medication Sig Start Date End Date Taking? Authorizing Provider  acetaminophen  (TYLENOL ) 500 MG tablet Take 500 mg by mouth every 6 (six) hours as needed for mild pain (pain score 1-3).    [provider]  albuterol  (PROVENTIL ) (2.5 MG/3ML) 0.083% nebulizer solution Take 3 mLs (2.5 mg total) by nebulization every 6 (six) hours as needed (during COPD Exacerbation). 02/15/24 02/14/25  Hope Almarie ORN, NP  albuterol  (VENTOLIN  HFA) 108 (90 Base) MCG/ACT inhaler INHALE 2 PUFFS INTO THE LUNGS EVERY 4 HOURS AS NEEDED FOR AHEEZING OR SHORTNESS OF BREATH 02/21/24   Parrett, Madelin RAMAN, NP  benzonatate  (TESSALON  PERLES) 100 MG capsule Take 1 capsule (100 mg total) by mouth 3 (three) times daily as needed for cough. 07/10/24   Raford Riggs, MD  bumetanide  (BUMEX ) 1 MG tablet Take 2 tablets (2 mg total) by mouth daily. Patient taking differently: Take 2 mg by mouth 2 (two) times daily. 05/25/24 06/27/24  Leotis Bogus, MD  diazepam  (VALIUM ) 5 MG tablet TAKE ONE TABLET BY MOUTH AT BEDTIME 07/09/24   Burchette, Wolm ORN, MD  ELIQUIS  2.5 MG TABS tablet TAKE ONE TABLET TWICE DAILY 06/21/24   Burchette, Wolm ORN, MD  empagliflozin  (JARDIANCE ) 10 MG TABS tablet Take 1 tablet  (10 mg total) by mouth daily. 06/01/24   Burchette, Wolm ORN, MD  famciclovir  (FAMVIR ) 250 MG tablet Take 1 tablet (250 mg total) by mouth daily. 06/20/24   Timmy Maude SAUNDERS, MD  feeding supplement (ENSURE PLUS HIGH PROTEIN) LIQD Take 237 mLs by mouth 2 (two) times daily between meals. 04/25/24   Arrien, Mauricio Daniel, MD  fludrocortisone  (FLORINEF ) 0.1 MG tablet Take 2 tablets (0.2 mg total) by mouth daily. 06/20/24   Timmy Maude SAUNDERS, MD  guaiFENesin  (MUCINEX ) 600 MG 12 hr tablet Take 1 tablet (600 mg total) by mouth 2 (two) times daily as needed for to loosen phlegm or cough. 04/25/24   Arrien, Mauricio Daniel, MD  HYDROcodone -acetaminophen  (NORCO/VICODIN) 5-325 MG tablet TAKE ONE TABLET EVERY 8 HOURS AS NEEDED FOR PAIN 05/12/24   Burchette, Wolm ORN, MD  lidocaine  (LIDODERM ) 5 % Place 1 patch onto the skin daily. Remove & Discard patch within 12 hours or as directed by MD 06/20/24   Timmy Maude SAUNDERS, MD  metolazone  (ZAROXOLYN ) 5 MG tablet Take metolazone  in the morning on Mondays and Thursdays, take extra potassium 40 mEq with metolazone  06/18/24   Swinyer, Rosaline CHRISTELLA, NP  ondansetron  (ZOFRAN ) 8 MG tablet Take 8 mg by mouth 30 to 60 min prior to Cyclophosphamide administration then take 8 mg every 8 hrs as needed for nausea and vomiting. 06/19/24   Timmy Maude SAUNDERS, MD  potassium chloride  SA (KLOR-CON  M) 20 MEQ tablet Take potassium 40 mEq daily,  take extra 40 EQ of potassium on the days you take metolazone  06/12/24   Fairy Frames, MD  prochlorperazine  (COMPAZINE ) 10 MG tablet Take 1 tablet (10 mg total) by mouth every 6 (six) hours as needed for nausea or vomiting. 06/19/24   Timmy Maude SAUNDERS, MD    Allergies: Aldactone  [spironolactone ], Amoxil  [amoxicillin ], Zebeta  [bisoprolol ], Calan  [verapamil ], Crestor  [rosuvastatin ], Flovent  [fluticasone  propionate], Livalo  [pitavastatin ], Lyrica  [pregabalin ], Neurontin [gabapentin], Norvasc  [amlodipine ], Zetia  [ezetimibe ], Zocor  [simvastatin ], Lotensin [benazepril],  and Polytrim  [polymyxin b -trimethoprim ]    Review of Systems  Updated Vital Signs BP (!) 98/53   Pulse 98   Temp 98.2 F (36.8 C) (Temporal)   Resp (!) 21   Ht 1.6 m (5' 3)   Wt 38.6 kg   SpO2 93%   BMI 15.06 kg/m   Physical Exam Vitals and nursing note reviewed.  Constitutional:      Appearance: She is well-developed. She is ill-appearing.     Comments: Cachectic  HENT:     Head: Normocephalic and atraumatic.     Right Ear: External ear normal.     Left Ear: External ear normal.  Eyes:     General: No scleral icterus.       Right eye: No discharge.        Left eye: No discharge.     Conjunctiva/sclera: Conjunctivae normal.  Neck:     Trachea: No tracheal deviation.  Cardiovascular:     Rate and Rhythm: Regular rhythm. Tachycardia present.  Pulmonary:     Breath sounds: No stridor. Rhonchi present. No wheezing or rales.  Abdominal:     General: Bowel sounds are normal. There is no distension.     Palpations: Abdomen is soft.     Tenderness: There is no abdominal tenderness. There is no guarding or rebound.  Musculoskeletal:        General: No deformity.     Cervical back: Neck supple.  Skin:    General: Skin is warm and dry.     Findings: No rash.  Neurological:     Mental Status: She is alert.     Cranial Nerves: No cranial nerve deficit, dysarthria or facial asymmetry.     Sensory: No sensory deficit.     Motor: Weakness present. No abnormal muscle tone or seizure activity.     (all labs ordered are listed, but only abnormal results are displayed) Labs Reviewed  CBC - Abnormal; Notable for the following components:      Result Value   WBC 16.8 (*)    All other components within normal limits  COMPREHENSIVE METABOLIC PANEL WITH GFR - Abnormal; Notable for the following components:   Sodium 129 (*)    Chloride 92 (*)    CO2 18 (*)    Glucose, Bld 144 (*)    BUN 110 (*)    Creatinine, Ser 4.09 (*)    Calcium  8.7 (*)    Total Protein 5.6 (*)     Albumin  1.7 (*)    Alkaline Phosphatase 131 (*)    GFR, Estimated 10 (*)    Anion gap 19 (*)    All other components within normal limits  TROPONIN I (HIGH SENSITIVITY) - Abnormal; Notable for the following components:   Troponin I (High Sensitivity) 411 (*)    All other components within normal limits  TROPONIN I (HIGH SENSITIVITY)    EKG: None  Radiology: DG Chest Portable 1 View Result Date: 07/12/2024 CLINICAL DATA:  Chest pain and nausea. EXAM: PORTABLE CHEST  1 VIEW COMPARISON:  Chest radiograph dated 06/26/2024. FINDINGS: Diffuse chronic atrial coarsening. Small bilateral pleural effusions and bibasilar reticulonodular densities concerning for pneumonia. Significant interval decrease in the size of bilateral pleural effusions and improved bibasilar densities since the prior radiograph. No detectable pneumothorax. Stable cardiac silhouette. Median sternotomy wires and left pectoral pacemaker device. Nondisplaced fracture of the right first rib with overlying chest wall emphysema. IMPRESSION: 1. Nondisplaced fracture of the right first rib with overlying chest wall emphysema. No detectable pneumothorax. 2. Decrease in the size of bilateral pleural effusions and improved bibasilar infiltrates. Electronically Signed   By: Vanetta Chou M.D.   On: 07/12/2024 21:31     .Critical Care  Performed by: Randol Simmonds, MD Authorized by: Randol Simmonds, MD   Critical care provider statement:    Critical care time (minutes):  30   Critical care was time spent personally by me on the following activities:  Development of treatment plan with patient or surrogate, discussions with consultants, evaluation of patient's response to treatment, examination of patient, ordering and review of laboratory studies, ordering and review of radiographic studies, ordering and performing treatments and interventions, pulse oximetry, re-evaluation of patient's condition and review of old charts    Medications Ordered  in the ED  morphine  (PF) 4 MG/ML injection 4 mg (4 mg Intravenous Given 07/12/24 2126)  aspirin  chewable tablet 324 mg (324 mg Oral Not Given 07/12/24 2131)  lactated ringers  bolus 500 mL (has no administration in time range)  lactated ringers  bolus 500 mL (500 mLs Intravenous New Bag/Given 07/12/24 2131)    Clinical Course as of 07/12/24 2307  Thu Jul 12, 2024  2115 Patient's initial EKG is concerning for possible global ischemia versus acute ST elevation MI.  I confirmed with family and patient at bedside.  Patient is currently under hospice care.  No aggressive interventions are desired including heart catheterization.  Will provide primarily comfort measures [JK]  2201 CXR shows nondisplaced.  Rib fracture.SABRA  No pneumonia or pneumothorax.   [JK]  2248 Troponin I (High Sensitivity)(!!) Troponin elevated 411 [JK]  2249 Comprehensive metabolic panel(!) Laboratory test shows acute kidney injury hyponatremia [JK]  2305 Case discussed with Dr Charlton [JK]    Clinical Course User Index [JK] Randol Simmonds, MD                                 Medical Decision Making Amount and/or Complexity of Data Reviewed Labs: ordered. Decision-making details documented in ED Course. Radiology: ordered.  Risk OTC drugs. Prescription drug management.   Patient's initial EKG is concerning for possible global ischemia versus ST elevation MI.  Patient however is currently  on hospice care.  Family confirms this.  She would not want cardiac catheterization CPR or other aggressive intervention.  Will check labs and provide comfort measures with morphine   Patient's lab abs do show elevated troponin concerning for acute cardiac ischemia.  Patient also has worsening renal function with an acute kidney injury, elevated creatinine.  Patient has been treated with IV morphine  for comfort. IV fluids.  Family confirms comfort measures.  They are hoping for her to go to inpatient hospice tomorrow.  I consulted with Dr.  Charlton regarding admission    Final diagnoses:  Cardiac ischemia  AKI (acute kidney injury)  Dehydration  Hyponatremia    ED Discharge Orders     None          Randol,  Thom, MD 07/12/24 2308

## 2024-07-12 NOTE — H&P (Signed)
 History and Physical    Catherine [name] FMW:[id number] DOB: 1935/05/05 DOA: 07/12/2024  PCP: Micheal Wolm ORN, MD   Patient coming from: Home   Chief Complaint: SOB, chest pain, N/V   HPI: Catherine [name] is an 89 y.o. female with medical history significant for COPD, chronic hypoxic respiratory failure, chronic HFpEF, CAD, atrial fibrillation, and CKD 3A who presents with chest pain, nausea, vomiting, and shortness of breath.  Patient complains of worsening shortness of breath and notes that she has been experiencing chest pressure, nausea, and vomiting for the past few days.  Family notes that her bilateral lower extremities have been very edematous and weeping.  She has recently enrolled in home hospice and has been using morphine  every 4 hours for shortness of breath, started atropine  drops today for excessive secretions, but has been unable to adequately manage her symptoms at home.    Patient, along with her daughter and husband at the bedside, are not interested in any further diagnostic testing or treatment for her underlying conditions but would like to focus on symptom management only going forward.  ED Course: Upon arrival to the ED, patient is found to be hypotensive and tachycardic with labored respirations.  Labs are concerning for sodium 129, BUN 110, creatinine 4.09, albumin  1.7, WBC 16,800, and troponin 411.  Patient was treated with morphine  in the ED.  Review of Systems:  All other systems reviewed and apart from HPI, are negative.  Past Medical History:  Diagnosis Date   AKI (acute kidney injury) 06/22/2022   Amyloidosis (HCC) 05/24/2024   Aortic stenosis, mild 07/26/2017   Arthritis    Asthma    last attack 02/2015   Cellulitis of left lower extremity 08/17/2016   Ulcer associated with severe venous insufficiency   Chronic anticoagulation    Chronic diastolic CHF (congestive heart failure) (HCC)    Chronic kidney disease    RIGHT MANY KIDNEY INFECTIONS AND  STONES   COPD (chronic obstructive pulmonary disease) (HCC)    Coronary artery disease    Dizziness    H/O: rheumatic fever    Hypertension    PAF (paroxysmal atrial fibrillation) (HCC)    PONV (postoperative nausea and vomiting)    ' SOMETIMES', BUT NOT ALWAYS   Primary hypertension    RHEUMATIC MITRAL STENOSIS 01/05/2008   Qualifier: Diagnosis of   By: Neysa MD, Clinton D        S/P Maze operation for atrial fibrillation 10/14/2016   Complete bilateral atrial lesion set using cryothermy and bipolar radiofrequency ablation - atrial appendage was not treated due to previous surgical procedure (open mitral commissurotomy)   S/P mitral valve replacement with bioprosthetic valve 10/14/2016   29 mm Medtronic Mosaic porcine bioprosthetic tissue valve   Sciatica 08/29/2023   Tricuspid regurgitation 07/14/2021   UTI (urinary tract infection)    Valvular heart disease    Has mitral stenosis with prior mitral commissurotomy in 1970    Past Surgical History:  Procedure Laterality Date   ABDOMINAL HYSTERECTOMY  1983   endometriosis   APPENDECTOMY     BACK SURGERY     neurosurgery x2   CARDIAC CATHETERIZATION     CARDIAC CATHETERIZATION N/A 08/03/2016   Procedure: Right/Left Heart Cath and Coronary Angiography;  Surgeon: Peter M Swaziland, MD;  Location: [facility] INVASIVE CV LAB;  Service: Cardiovascular;  Laterality: N/A;   cataract surg     CHOLECYSTECTOMY N/A 05/30/2015   Procedure: LAPAROSCOPIC CHOLECYSTECTOMY WITH INTRAOPERATIVE CHOLANGIOGRAM;  Surgeon: Morene Olives, MD;  Location: [facility] OR;  Service: General;  Laterality: N/A;   COLONOSCOPY     CORONARY ARTERY BYPASS GRAFT N/A 10/14/2016   Procedure: CORONARY ARTERY BYPASS GRAFTING (CABG);  Surgeon: Sudie VEAR Laine, MD;  Location: [facility] OR;  Service: Open Heart Surgery;  Laterality: N/A;   EYE SURGERY     IR BONE MARROW BIOPSY & ASPIRATION  05/21/2024   IR THORACENTESIS ASP PLEURAL SPACE W/IMG GUIDE  04/24/2024   MAZE N/A 10/14/2016    Procedure: MAZE;  Surgeon: Sudie VEAR Laine, MD;  Location: [facility] OR;  Service: Open Heart Surgery;  Laterality: N/A;   MITRAL VALVE REPLACEMENT N/A 10/14/2016   Procedure: REDO MITRAL VALVE REPLACEMENT (MVR);  Surgeon: Sudie VEAR Laine, MD;  Location: [facility] OR;  Service: Open Heart Surgery;  Laterality: N/A;   MITRAL VALVE SURGERY Left 1970   Open mitral commissurotomy via left thoracotomy approach   PACEMAKER IMPLANT N/A 09/22/2023   Procedure: PACEMAKER IMPLANT;  Surgeon: Kennyth Chew, MD;  Location: [facility] INVASIVE CV LAB;  Service: Cardiovascular;  Laterality: N/A;   RIGHT HEART CATH N/A 05/17/2024   Procedure: RIGHT HEART CATH;  Surgeon: Darron Deatrice LABOR, MD;  Location: [facility] INVASIVE CV LAB;  Service: Cardiovascular;  Laterality: N/A;   TEE WITHOUT CARDIOVERSION N/A 07/05/2016   Procedure: TRANSESOPHAGEAL ECHOCARDIOGRAM (TEE);  Surgeon: Annabella Scarce, MD;  Location: [facility] ENDOSCOPY;  Service: Cardiovascular;  Laterality: N/A;   TEE WITHOUT CARDIOVERSION N/A 10/14/2016   Procedure: TRANSESOPHAGEAL ECHOCARDIOGRAM (TEE);  Surgeon: Sudie VEAR Laine, MD;  Location: [facility] OR;  Service: Open Heart Surgery;  Laterality: N/A;    Social History:   reports that she quit smoking about 48 years ago. Her smoking use included cigarettes. She started smoking about 63 years ago. She has a 15 pack-year smoking history. She has never used smokeless tobacco. She reports that she does not drink alcohol and does not use drugs.  Allergies  Allergen Reactions   Aldactone  [Spironolactone ] Other (See Comments)    Dyspnea    Amoxil  [Amoxicillin ] Palpitations and Other (See Comments)    Tachycardia   Zebeta  [Bisoprolol ] Shortness Of Breath   Calan  [Verapamil ] Other (See Comments)    Myalgias    Crestor  [Rosuvastatin ] Other (See Comments)    Myalgias    Flovent  [Fluticasone  Propionate] Other (See Comments)    Leg cramps   Livalo  [Pitavastatin ] Other (See Comments)    Myalgias    Lyrica  [Pregabalin ] Swelling   Neurontin  [Gabapentin] Swelling   Norvasc  [Amlodipine ] Swelling    Low extremity edema   Zetia  [Ezetimibe ] Other (See Comments)    Myalgias   Zocor  [Simvastatin ] Other (See Comments)    Myalgias    Lotensin [Benazepril] Cough   Polytrim  [Polymyxin B -Trimethoprim ] Swelling    Family History  Problem Relation Age of Onset   Leukemia Father    Breast cancer Neg Hx      Prior to Admission medications   Medication Sig Start Date End Date Taking? Authorizing Provider  acetaminophen  (TYLENOL ) 500 MG tablet Take 500 mg by mouth every 6 (six) hours as needed for mild pain (pain score 1-3).   Yes [provider]  albuterol  (PROVENTIL ) (2.5 MG/3ML) 0.083% nebulizer solution Take 3 mLs (2.5 mg total) by nebulization every 6 (six) hours as needed (during COPD Exacerbation). 02/15/24 02/14/25 Yes Hope Almarie ORN, NP  albuterol  (VENTOLIN  HFA) 108 (90 Base) MCG/ACT inhaler INHALE 2 PUFFS INTO THE LUNGS EVERY 4 HOURS AS NEEDED FOR AHEEZING OR SHORTNESS OF BREATH 02/21/24  Yes Parrett, Tammy  S, NP  atropine  1 % ophthalmic solution Place 4 drops under the tongue every 6 (six) hours as needed (dry mouth). 07/12/24  Yes [provider]  azithromycin  (ZITHROMAX ) 250 MG tablet Take by mouth. 07/10/24  Yes [provider]  benzonatate  (TESSALON  PERLES) 100 MG capsule Take 1 capsule (100 mg total) by mouth 3 (three) times daily as needed for cough. 07/10/24  Yes Raford Riggs, MD  bumetanide  (BUMEX ) 2 MG tablet Take 2 mg by mouth 2 (two) times daily. 07/07/24  Yes [provider]  diazepam  (VALIUM ) 5 MG tablet TAKE ONE TABLET BY MOUTH AT BEDTIME Patient taking differently: Take 5 mg by mouth in the morning and at bedtime. Take one tablet (5mg ) by mouth at bedtime. May take one additional tablet during the day as needed for agitation/anxiety. 07/09/24  Yes Burchette, Wolm ORN, MD  empagliflozin  (JARDIANCE ) 10 MG TABS tablet Take 1 tablet (10 mg total) by mouth daily. 06/01/24  Yes  Burchette, Wolm ORN, MD  famciclovir  (FAMVIR ) 250 MG tablet Take 1 tablet (250 mg total) by mouth daily. 06/20/24  Yes Timmy Maude SAUNDERS, MD  feeding supplement (ENSURE PLUS HIGH PROTEIN) LIQD Take 237 mLs by mouth 2 (two) times daily between meals. 04/25/24  Yes Arrien, Mauricio Daniel, MD  fluconazole  (DIFLUCAN ) 150 MG tablet Take 150 mg by mouth daily. 07/12/24  Yes [provider]  fludrocortisone  (FLORINEF ) 0.1 MG tablet Take 2 tablets (0.2 mg total) by mouth daily. 06/20/24  Yes Ennever, Maude SAUNDERS, MD  GNP ASPIRIN  81 MG tablet Take 81 mg by mouth daily. 07/02/24  Yes [provider]  guaiFENesin  (MUCINEX ) 600 MG 12 hr tablet Take 1 tablet (600 mg total) by mouth 2 (two) times daily as needed for to loosen phlegm or cough. 04/25/24  Yes Arrien, Elidia Sieving, MD  HYDROcodone -acetaminophen  (NORCO/VICODIN) 5-325 MG tablet TAKE ONE TABLET EVERY 8 HOURS AS NEEDED FOR PAIN 05/12/24  Yes Burchette, Wolm ORN, MD  lidocaine  (LIDODERM ) 5 % Place 1 patch onto the skin daily. Remove & Discard patch within 12 hours or as directed by MD 06/20/24  Yes Timmy Maude SAUNDERS, MD  metolazone  (ZAROXOLYN ) 5 MG tablet Take metolazone  in the morning on Mondays and Thursdays, take extra potassium 40 mEq with metolazone  06/18/24  Yes Swinyer, Rosaline HERO, NP  Morphine  Sulfate (MORPHINE  CONCENTRATE) 10 mg / 0.5 ml concentrated solution Take 5 mg by mouth every 4 (four) hours as needed for moderate pain (pain score 4-6) or severe pain (pain score 7-10). 07/02/24  Yes [provider]  ondansetron  (ZOFRAN ) 8 MG tablet Take 8 mg by mouth 30 to 60 min prior to Cyclophosphamide administration then take 8 mg every 8 hrs as needed for nausea and vomiting. Patient taking differently: Take 8 mg by mouth every 8 (eight) hours as needed for nausea or vomiting. take 8 mg every 8 hrs as needed for nausea and vomiting. 06/19/24  Yes Ennever, Maude SAUNDERS, MD  OXYGEN  Inhale 3 L into the lungs continuous.   Yes [provider]   potassium chloride  SA (KLOR-CON  M) 20 MEQ tablet Take potassium 40 mEq daily, take extra 40 EQ of potassium on the days you take metolazone  06/12/24  Yes Fairy Frames, MD  prochlorperazine  (COMPAZINE ) 10 MG tablet Take 1 tablet (10 mg total) by mouth every 6 (six) hours as needed for nausea or vomiting. 06/19/24  Yes Ennever, Maude SAUNDERS, MD  Sennosides (SENNA) 8.6 MG CAPS Take 8.6 mg by mouth daily as needed (mild constipation).  Yes [provider]  zinc  gluconate 50 MG tablet Take 50 mg by mouth daily.   Yes [provider]    Physical Exam: Vitals:   07/12/24 2145 07/12/24 2200 07/12/24 2215 07/12/24 2330  BP: (!) 93/53 (!) 100/58 (!) 98/53 (!) 102/52  Pulse: (!) 102 98  98  Resp: 18 20 (!) 21 19  Temp:      TempSrc:      SpO2: 94% 93%  95%  Weight:      Height:         Constitutional: Labored respirations. Appears very frail.   Eyes: PERTLA, lids and conjunctivae normal ENMT: Mucous membranes are dry. Posterior pharynx clear of any exudate or lesions.   Neck: supple, no masses  Respiratory: Diminished bilaterally with fine rales. Dyspneic with speech.  Cardiovascular: S1 & S2 heard, regular rate and rhythm. Pretibial pitting edema.  Abdomen: No tenderness, soft. Bowel sounds active.  Musculoskeletal: no clubbing / cyanosis. No joint deformity upper and lower extremities.   Skin: Erythematous papules about the face, neck, and trunk. Warm, dry, well-perfused. Neurologic: CN 2-12 grossly intact. Moving all extremities. Awake and oriented to person, place, and situation.  Psychiatric: Pleasant. Cooperative.    Labs and Imaging on Admission: I have personally reviewed following labs and imaging studies  CBC: Recent Labs  Lab 07/12/24 2113  WBC 16.8*  HGB 13.3  HCT 39.7  MCV 87.4  PLT 174   Basic Metabolic Panel: Recent Labs  Lab 07/12/24 2113  NA 129*  K 4.4  CL 92*  CO2 18*  GLUCOSE 144*  BUN 110*  CREATININE 4.09*  CALCIUM  8.7*    GFR: Estimated Creatinine Clearance: 5.9 mL/min (A) (by C-G formula based on SCr of 4.09 mg/dL (H)). Liver Function Tests: Recent Labs  Lab 07/12/24 2113  AST 29  ALT 19  ALKPHOS 131*  BILITOT 0.7  PROT 5.6*  ALBUMIN  1.7*   No results for input(s): LIPASE, AMYLASE in the last 168 hours. No results for input(s): AMMONIA in the last 168 hours. Coagulation Profile: No results for input(s): INR, PROTIME in the last 168 hours. Cardiac Enzymes: No results for input(s): CKTOTAL, CKMB, CKMBINDEX, TROPONINI in the last 168 hours. BNP (last 3 results) Recent Labs    05/14/24 0731 06/04/24 1305 06/26/24 1616  PROBNP 15,910.0* 28,847.0* >35,000.0*   HbA1C: No results for input(s): HGBA1C in the last 72 hours. CBG: No results for input(s): GLUCAP in the last 168 hours. Lipid Profile: No results for input(s): CHOL, HDL, LDLCALC, TRIG, CHOLHDL, LDLDIRECT in the last 72 hours. Thyroid  Function Tests: No results for input(s): TSH, T4TOTAL, FREET4, T3FREE, THYROIDAB in the last 72 hours. Anemia Panel: No results for input(s): VITAMINB12, FOLATE, FERRITIN, TIBC, IRON , RETICCTPCT in the last 72 hours. Urine analysis:    Component Value Date/Time   COLORURINE YELLOW 06/04/2024 1505   APPEARANCEUR CLEAR 06/04/2024 1505   APPEARANCEUR Cloudy (A) 07/22/2022 1513   LABSPEC 1.009 06/04/2024 1505   PHURINE 6.0 06/04/2024 1505   GLUCOSEU NEGATIVE 06/04/2024 1505   HGBUR SMALL (A) 06/04/2024 1505   BILIRUBINUR NEGATIVE 06/04/2024 1505   BILIRUBINUR positive 07/11/2023 1453   BILIRUBINUR Negative 07/22/2022 1513   KETONESUR NEGATIVE 06/04/2024 1505   PROTEINUR >300 (A) 06/04/2024 1505   UROBILINOGEN 1.0 07/11/2023 1453   UROBILINOGEN 0.2 03/15/2015 2151   NITRITE NEGATIVE 06/04/2024 1505   LEUKOCYTESUR NEGATIVE 06/04/2024 1505   Sepsis Labs: @LABRCNTIP (procalcitonin:4,lacticidven:4) )No results found for this or any previous  visit (from the past  240 hours).   Radiological Exams on Admission: DG Chest Portable 1 View Result Date: 07/12/2024 CLINICAL DATA:  Chest pain and nausea. EXAM: PORTABLE CHEST 1 VIEW COMPARISON:  Chest radiograph dated 06/26/2024. FINDINGS: Diffuse chronic atrial coarsening. Small bilateral pleural effusions and bibasilar reticulonodular densities concerning for pneumonia. Significant interval decrease in the size of bilateral pleural effusions and improved bibasilar densities since the prior radiograph. No detectable pneumothorax. Stable cardiac silhouette. Median sternotomy wires and left pectoral pacemaker device. Nondisplaced fracture of the right first rib with overlying chest wall emphysema. IMPRESSION: 1. Nondisplaced fracture of the right first rib with overlying chest wall emphysema. No detectable pneumothorax. 2. Decrease in the size of bilateral pleural effusions and improved bibasilar infiltrates. Electronically Signed   By: Vanetta Chou M.D.   On: 07/12/2024 21:31    EKG: Independently reviewed. Sinus tachycardia, rate 115, diffuse repolarization abnormality.   Assessment/Plan   1. AKI superimposed on CKD 3A; Acute on chronic HFpEF; CAD with myocardial injury vs NSTEMI   - Patient and family have decided to focus on symptom management only with no further lab draws or cardiac monitoring   2. COPD; chronic hypoxic respiratory failure  - Continue oxygen  and bronchodilators as-needed for comfort    3. Atrial fibrillation  - Currently in sinus rhythm    4. Multiple myeloma  - She has elected for comfort care only     DVT prophylaxis: None Code Status: DNR/DNI Level of Care: Level of care: Palliative Care Family Communication: Husband and daughter at bedside  Disposition Plan:  Patient is from: Home  Anticipated d/c is to: TBD Anticipated d/c date is: 07/15/24  Patient currently: Pending disposition planning  Consults called: None  Admission status: Inpatient      Evalene GORMAN Sprinkles, MD [facility]  07/13/2024, 12:55 AM

## 2024-07-12 NOTE — ED Triage Notes (Signed)
 Patient here from home with family with complaints of chest pain, nausea and vomiting for the past few days.  Labored breathing and vomiting on arrival.   Hospice nurse from [facility] gave report on patient before she got here. She has multiple myeloma, chf, copd, a-fib, and malnutrition. Dx with sinus infection Saturday and started taking a Z-pack but has been getting worse.  Hospice nurse reports she takes 5mg  of morphine  q4hrs for shob, was started on atropine  drops today to help with throat secretions. 4+ edema on both legs that are weeping.   Oxygen  dependent 3L.  DNR [facility] nurse on call number - [phone].

## 2024-07-13 ENCOUNTER — Encounter: Payer: Self-pay | Admitting: *Deleted

## 2024-07-13 ENCOUNTER — Encounter (HOSPITAL_COMMUNITY): Payer: Self-pay | Admitting: Family Medicine

## 2024-07-13 DIAGNOSIS — I5A Non-ischemic myocardial injury (non-traumatic): Secondary | ICD-10-CM | POA: Diagnosis present

## 2024-07-13 DIAGNOSIS — N1831 Chronic kidney disease, stage 3a: Secondary | ICD-10-CM | POA: Diagnosis not present

## 2024-07-13 DIAGNOSIS — N170 Acute kidney failure with tubular necrosis: Secondary | ICD-10-CM

## 2024-07-13 MED ORDER — OXYCODONE HCL 20 MG/ML PO CONC: 6.0000 mg | ORAL | 0 refills | Status: DC | PRN

## 2024-07-13 MED ORDER — HALOPERIDOL LACTATE 2 MG/ML PO CONC: 0.6000 mg | ORAL | 0 refills | Status: DC | PRN

## 2024-07-13 MED ORDER — ACETAMINOPHEN 325 MG PO TABS: 650.0000 mg | ORAL_TABLET | Freq: Four times a day (QID) | ORAL | Status: DC | PRN

## 2024-07-13 MED ORDER — POLYVINYL ALCOHOL 1.4 % OP SOLN: 1.0000 [drp] | Freq: Four times a day (QID) | OPHTHALMIC | 0 refills | Status: DC | PRN

## 2024-07-13 MED ORDER — PROCHLORPERAZINE MALEATE 5 MG PO TABS: 5.0000 mg | ORAL_TABLET | Freq: Four times a day (QID) | ORAL | 0 refills | Status: DC | PRN

## 2024-07-13 MED ORDER — LORAZEPAM 2 MG/ML PO CONC: 1.0000 mg | ORAL | 0 refills | Status: DC | PRN

## 2024-07-13 NOTE — Care Management CC44 (Signed)
 Condition Code 44 Documentation Completed  Patient Details  Name: Catherine [name] MRN: [id number] Date of Birth: 1935-02-20   Condition Code 44 given:  Yes Patient signature on Condition Code 44 notice:  Yes Documentation of 2 MD's agreement:  Yes Code 44 added to claim:  Yes    Tom-Johnson, Harvest Muskrat, RN 07/13/2024, 4:36 PM

## 2024-07-13 NOTE — Plan of Care (Signed)
   Problem: Role Relationship: Goal: Family's ability to cope with current situation will improve Outcome: Progressing

## 2024-07-13 NOTE — Care Management Obs Status (Signed)
 MEDICARE OBSERVATION STATUS NOTIFICATION   Patient Details  Name: Catherine [name] MRN: [id number] Date of Birth: August 20, 1935   Medicare Observation Status Notification Given:  Yes    Tom-Johnson, Harvest Muskrat, RN 07/13/2024, 4:36 PM

## 2024-07-13 NOTE — Progress Notes (Signed)
 Patient was due to be seen yesterday but had cancelled all her appointments. Per discussion with desk RN, patient called several days after her treatment and stated that she didn't want to continue with treatment nor schedule any further follow up care with this office. Dr Timmy was notified and appointments cancelled.   Oncology Nurse Navigator Documentation     07/13/2024    1:00 PM  Oncology Nurse Navigator Flowsheets  Navigation Complete Date: 07/13/2024  Post Navigation: Continue to Follow Patient? 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

## 2024-07-13 NOTE — TOC Transition Note (Signed)
 Transition of Care [facility]) - Discharge Note   Patient Details  Name: Catherine [name] MRN: [id number] Date of Birth: [birth year]  Transition of Care [facility]) CM/SW Contact:  Tom-Johnson, Kalanie Fewell Daphne, RN Phone Number: 07/13/2024, 4:13 PM   Clinical Narrative:     Patient is scheduled for discharge today to [facility] in [place]. Daughter, Mliss at bedside. [facility] scheduled to transport. No further TOC needs noted.        Final next level of care: Hospice Medical Facility Barriers to Discharge: Barriers Resolved   Patient Goals and CMS Choice Patient states their goals for this hospitalization and ongoing recovery are:: To go to [facility] in [place]. CMS Medicare.gov Compare Post Acute Care list provided to:: Patient Choice offered to / list presented to : Patient, Adult Children (Daughter, Mliss)      Discharge Placement                Patient to be transferred to facility by: [facility]      Discharge Plan and Services Additional resources added to the After Visit Summary for                  DME Arranged: N/A DME Agency: NA       HH Arranged: NA HH Agency: NA        Social Drivers of Health (SDOH) Interventions SDOH Screenings   Food Insecurity: No Food Insecurity (06/04/2024)  Housing: Low Risk  (06/04/2024)  Transportation Needs: No Transportation Needs (06/04/2024)  Utilities: Not At Risk (06/04/2024)  Alcohol Screen: Low Risk  (11/06/2021)  Depression (PHQ2-9): Medium Risk (06/20/2024)  Financial Resource Strain: Low Risk  (04/09/2022)  Physical Activity: Insufficiently Active (04/09/2022)  Social Connections: Socially Integrated (06/04/2024)  Stress: No Stress Concern Present (04/09/2022)  Tobacco Use: Medium Risk (07/13/2024)  Health Literacy: Adequate Health Literacy (06/20/2024)     Readmission Risk Interventions    06/05/2024    2:59 PM 05/22/2024    3:49 PM 04/24/2024   12:04 PM  Readmission Risk  Prevention Plan  Transportation Screening Complete Complete Complete  PCP or Specialist Appt within 3-5 Days   Complete  HRI or Home Care Consult   Complete  Social Work Consult for Recovery Care Planning/Counseling   Complete  Palliative Care Screening   Not Applicable  Medication Review Oceanographer) Complete Referral to Pharmacy Referral to Pharmacy  PCP or Specialist appointment within 3-5 days of discharge Complete    HRI or Home Care Consult Complete Complete   SW Recovery Care/Counseling Consult  Complete   Palliative Care Screening -- Not Applicable   Skilled Nursing Facility Not Applicable Not Applicable

## 2024-07-13 NOTE — Discharge Summary (Signed)
 Physician Discharge Summary  Patient ID: Catherine [name] MRN: [id number] DOB/AGE: Mar 02, 1935 89 y.o.  Admit date: 07/12/2024 Discharge date: 07/13/2024  Admission Diagnoses:  Discharge Diagnoses:  Principal Problem:   Acute renal failure superimposed on stage 3a chronic kidney disease (HCC) Active Problems:   Atrial fibrillation, chronic (HCC)   Coronary artery disease   COPD  GOLD 2   Chronic respiratory failure with hypoxia (HCC)   CHF exacerbation (HCC)   Multiple myeloma (HCC)   Myocardial injury   Discharged Condition: stable  Hospital Course: Patient is an 89 year old female with past medical history significant for COPD, chronic hypoxic respiratory failure, chronic HFpEF, CAD, atrial fibrillation, and CKD 3A.  Patient presented with chest pain, nausea, vomiting, and shortness of breath.  Family also noted bilateral lower extremity edema.  Patient was recently enrolled in home hospice and has been using morphine  every 4 hours for shortness of breath, started atropine  drops for excessive secretions on the day of presentation, but was unable to adequately manage her symptoms at home.  Upon arrival to the ED, patient is found to be hypotensive and tachycardic with labored respirations.  Labs were concerning for sodium 129, BUN 110, creatinine 4.09, albumin  1.7, WBC 16,800, and troponin 411.  As documented above, the goal was comfort directed care.  Patient will be discharged to residential hospice facility.   AKI superimposed on CKD 3A; Acute on chronic HFpEF; CAD with myocardial injury vs NSTEMI: - Patient and family have decided to focus on symptom management only, with no further lab draws or cardiac monitoring    2. COPD; chronic hypoxic respiratory failure  - Continued oxygen  and bronchodilators as-needed for comfort     3. Atrial fibrillation  - Currently in sinus rhythm     4. Multiple myeloma  - Comfort cares only.    Consults: None    Discharge Exam: Blood  pressure (!) 80/51, pulse 79, temperature 98 F (36.7 C), resp. rate 12, height 5' 3 (1.6 m), weight 38.6 kg, SpO2 92%.   Disposition: Discharge disposition: 51-Hospice/Medical Facility       Discharge Instructions     Diet - low sodium heart healthy   Complete by: As directed    Increase activity slowly   Complete by: As directed       Allergies as of 07/13/2024       Reactions   Aldactone  [spironolactone ] Other (See Comments)   Dyspnea    Amoxil  [amoxicillin ] Palpitations, Other (See Comments)   Tachycardia   Zebeta  [bisoprolol ] Shortness Of Breath   Calan  [verapamil ] Other (See Comments)   Myalgias    Crestor  [rosuvastatin ] Other (See Comments)   Myalgias    Flovent  [fluticasone  Propionate] Other (See Comments)   Leg cramps   Livalo  [pitavastatin ] Other (See Comments)   Myalgias    Lyrica  [pregabalin ] Swelling   Neurontin [gabapentin] Swelling   Norvasc  [amlodipine ] Swelling   Low extremity edema   Zetia  [ezetimibe ] Other (See Comments)   Myalgias   Zocor  [simvastatin ] Other (See Comments)   Myalgias    Lotensin [benazepril] Cough   Polytrim  [polymyxin B -trimethoprim ] Swelling        Medication List     STOP taking these medications    azithromycin  250 MG tablet Commonly known as: ZITHROMAX    benzonatate  100 MG capsule Commonly known as: Tessalon  Perles   bumetanide  2 MG tablet Commonly known as: BUMEX    diazepam  5 MG tablet Commonly known as: VALIUM    empagliflozin  10 MG Tabs tablet Commonly  known as: JARDIANCE    famciclovir  250 MG tablet Commonly known as: FAMVIR    feeding supplement Liqd   fluconazole  150 MG tablet Commonly known as: DIFLUCAN    fludrocortisone  0.1 MG tablet Commonly known as: FLORINEF    GNP Aspirin  81 MG tablet Generic drug: aspirin  EC   guaiFENesin  600 MG 12 hr tablet Commonly known as: MUCINEX    HYDROcodone -acetaminophen  5-325 MG tablet Commonly known as: NORCO/VICODIN   metolazone  5 MG  tablet Commonly known as: ZAROXOLYN    morphine  CONCENTRATE 10 mg / 0.5 ml concentrated solution   ondansetron  8 MG tablet Commonly known as: Zofran    potassium chloride  SA 20 MEQ tablet Commonly known as: KLOR-CON  M   zinc  gluconate 50 MG tablet       TAKE these medications    acetaminophen  325 MG tablet Commonly known as: TYLENOL  Take 2 tablets (650 mg total) by mouth every 6 (six) hours as needed for mild pain (pain score 1-3) or fever (or Fever >/= 101). What changed:  medication strength how much to take reasons to take this   albuterol  (2.5 MG/3ML) 0.083% nebulizer solution Commonly known as: PROVENTIL  Take 3 mLs (2.5 mg total) by nebulization every 6 (six) hours as needed (during COPD Exacerbation). What changed: Another medication with the same name was removed. Continue taking this medication, and follow the directions you see here.   artificial tears ophthalmic solution Place 1 drop into both eyes 4 (four) times daily as needed for dry eyes.   atropine  1 % ophthalmic solution Place 4 drops under the tongue every 6 (six) hours as needed (dry mouth).   haloperidol 2 MG/ML solution Commonly known as: HALDOL Place 0.3 mLs (0.6 mg total) under the tongue every 4 (four) hours as needed for agitation (or delirium).   lidocaine  5 % Commonly known as: LIDODERM  Place 1 patch onto the skin daily. Remove & Discard patch within 12 hours or as directed by MD   LORazepam 2 MG/ML concentrated solution Commonly known as: ATIVAN Place 0.5 mLs (1 mg total) under the tongue every 4 (four) hours as needed for anxiety.   oxyCODONE  20 MG/ML concentrated solution Commonly known as: ROXICODONE  INTENSOL Take 0.3 mLs (6 mg total) by mouth every 2 (two) hours as needed for up to 7 days for moderate pain (pain score 4-6) (or dyspnea).   oxyCODONE  20 MG/ML concentrated solution Commonly known as: ROXICODONE  INTENSOL Place 0.3 mLs (6 mg total) under the tongue every 2 (two) hours as  needed for up to 7 days for moderate pain (pain score 4-6) (or dyspnea).   OXYGEN  Inhale 3 L into the lungs continuous.   prochlorperazine  5 MG tablet Commonly known as: COMPAZINE  Take 1 tablet (5 mg total) by mouth every 6 (six) hours as needed for nausea. What changed:  medication strength how much to take reasons to take this   Senna 8.6 MG Caps Take 8.6 mg by mouth daily as needed (mild constipation).       Time spent: 35 minutes  Signed: Leatrice LILLETTE Chapel 07/13/2024, 4:26 PM

## 2024-07-15 ENCOUNTER — Encounter: Payer: Self-pay | Admitting: Family Medicine

## 2024-07-19 ENCOUNTER — Inpatient Hospital Stay

## 2024-07-19 ENCOUNTER — Inpatient Hospital Stay: Admitting: Dietician

## 2024-07-21 ENCOUNTER — Ambulatory Visit: Payer: Self-pay | Admitting: Cardiology

## 2024-07-26 ENCOUNTER — Inpatient Hospital Stay

## 2024-07-28 NOTE — Progress Notes (Signed)
 Patient discharged via [facility] to [facility].  Patient's daughter at the bedside and she takes all the patient's personal belongings with her.  Patient's PIV removed and dressing is clean, dry, and intact.  Suzen Ice RN

## 2024-07-28 DEATH — deceased

## 2024-08-06 ENCOUNTER — Ambulatory Visit (HOSPITAL_BASED_OUTPATIENT_CLINIC_OR_DEPARTMENT_OTHER): Admitting: Nurse Practitioner

## 2024-08-25 DIAGNOSIS — R609 Edema, unspecified: Secondary | ICD-10-CM

## 2024-08-25 DIAGNOSIS — R7989 Other specified abnormal findings of blood chemistry: Secondary | ICD-10-CM

## 2024-08-29 ENCOUNTER — Ambulatory Visit
